# Patient Record
Sex: Male | Born: 1940 | Race: White | Hispanic: No | Smoking: Former smoker
Health system: Southern US, Community
[De-identification: ages and names within clinical notes are randomized; demographics above are authoritative.]

## PROBLEM LIST (undated history)

## (undated) DIAGNOSIS — F419 Anxiety disorder, unspecified: Secondary | ICD-10-CM

## (undated) DIAGNOSIS — T8859XA Other complications of anesthesia, initial encounter: Secondary | ICD-10-CM

## (undated) DIAGNOSIS — I480 Paroxysmal atrial fibrillation: Secondary | ICD-10-CM

## (undated) DIAGNOSIS — F209 Schizophrenia, unspecified: Secondary | ICD-10-CM

## (undated) DIAGNOSIS — I5032 Chronic diastolic (congestive) heart failure: Secondary | ICD-10-CM

## (undated) DIAGNOSIS — T4145XA Adverse effect of unspecified anesthetic, initial encounter: Secondary | ICD-10-CM

## (undated) DIAGNOSIS — M069 Rheumatoid arthritis, unspecified: Secondary | ICD-10-CM

## (undated) DIAGNOSIS — M545 Low back pain, unspecified: Secondary | ICD-10-CM

## (undated) DIAGNOSIS — R9389 Abnormal findings on diagnostic imaging of other specified body structures: Secondary | ICD-10-CM

## (undated) DIAGNOSIS — M48061 Spinal stenosis, lumbar region without neurogenic claudication: Secondary | ICD-10-CM

## (undated) DIAGNOSIS — E785 Hyperlipidemia, unspecified: Secondary | ICD-10-CM

## (undated) DIAGNOSIS — F32A Depression, unspecified: Secondary | ICD-10-CM

## (undated) DIAGNOSIS — Z9889 Other specified postprocedural states: Secondary | ICD-10-CM

## (undated) DIAGNOSIS — J302 Other seasonal allergic rhinitis: Secondary | ICD-10-CM

## (undated) DIAGNOSIS — F329 Major depressive disorder, single episode, unspecified: Secondary | ICD-10-CM

## (undated) DIAGNOSIS — H409 Unspecified glaucoma: Secondary | ICD-10-CM

## (undated) DIAGNOSIS — Z9289 Personal history of other medical treatment: Secondary | ICD-10-CM

## (undated) DIAGNOSIS — G8929 Other chronic pain: Secondary | ICD-10-CM

## (undated) DIAGNOSIS — Z973 Presence of spectacles and contact lenses: Secondary | ICD-10-CM

## (undated) DIAGNOSIS — Z8679 Personal history of other diseases of the circulatory system: Secondary | ICD-10-CM

## (undated) DIAGNOSIS — R413 Other amnesia: Secondary | ICD-10-CM

## (undated) DIAGNOSIS — Z8619 Personal history of other infectious and parasitic diseases: Secondary | ICD-10-CM

## (undated) DIAGNOSIS — I499 Cardiac arrhythmia, unspecified: Secondary | ICD-10-CM

## (undated) DIAGNOSIS — I2510 Atherosclerotic heart disease of native coronary artery without angina pectoris: Secondary | ICD-10-CM

## (undated) DIAGNOSIS — R011 Cardiac murmur, unspecified: Secondary | ICD-10-CM

## (undated) DIAGNOSIS — G629 Polyneuropathy, unspecified: Secondary | ICD-10-CM

## (undated) DIAGNOSIS — M546 Pain in thoracic spine: Secondary | ICD-10-CM

## (undated) DIAGNOSIS — M1990 Unspecified osteoarthritis, unspecified site: Secondary | ICD-10-CM

## (undated) DIAGNOSIS — I10 Essential (primary) hypertension: Secondary | ICD-10-CM

## (undated) DIAGNOSIS — I38 Endocarditis, valve unspecified: Secondary | ICD-10-CM

## (undated) DIAGNOSIS — K219 Gastro-esophageal reflux disease without esophagitis: Secondary | ICD-10-CM

## (undated) DIAGNOSIS — I509 Heart failure, unspecified: Secondary | ICD-10-CM

## (undated) DIAGNOSIS — R519 Headache, unspecified: Secondary | ICD-10-CM

## (undated) DIAGNOSIS — I4821 Permanent atrial fibrillation: Secondary | ICD-10-CM

## (undated) DIAGNOSIS — D649 Anemia, unspecified: Secondary | ICD-10-CM

## (undated) HISTORY — DX: Personal history of other infectious and parasitic diseases: Z86.19

## (undated) HISTORY — PX: CARDIAC VALVE REPLACEMENT: SHX585

## (undated) HISTORY — DX: Personal history of other medical treatment: Z92.89

## (undated) HISTORY — DX: Personal history of other diseases of the circulatory system: Z86.79

## (undated) HISTORY — DX: Endocarditis, valve unspecified: I38

## (undated) HISTORY — PX: BACK SURGERY: SHX140

## (undated) HISTORY — DX: Paroxysmal atrial fibrillation: I48.0

## (undated) HISTORY — DX: Major depressive disorder, single episode, unspecified: F32.9

## (undated) HISTORY — DX: Permanent atrial fibrillation: I48.21

## (undated) HISTORY — DX: Chronic diastolic (congestive) heart failure: I50.32

## (undated) HISTORY — DX: Heart failure, unspecified: I50.9

## (undated) HISTORY — DX: Hyperlipidemia, unspecified: E78.5

## (undated) HISTORY — DX: Cardiac arrhythmia, unspecified: I49.9

## (undated) HISTORY — DX: Atherosclerotic heart disease of native coronary artery without angina pectoris: I25.10

## (undated) HISTORY — DX: Depression, unspecified: F32.A

## (undated) HISTORY — DX: Essential (primary) hypertension: I10

## (undated) HISTORY — DX: Cardiac murmur, unspecified: R01.1

## (undated) HISTORY — PX: MULTIPLE TOOTH EXTRACTIONS: SHX2053

## (undated) HISTORY — DX: Personal history of other diseases of the circulatory system: Z98.890

## (undated) HISTORY — PX: JOINT REPLACEMENT: SHX530

## (undated) HISTORY — DX: Unspecified glaucoma: H40.9

---

## 2001-05-24 HISTORY — PX: REPLACEMENT TOTAL KNEE: SUR1224

## 2005-05-24 HISTORY — PX: AORTIC VALVE REPLACEMENT: SHX41

## 2008-05-24 HISTORY — PX: THORACIC AORTIC ANEURYSM REPAIR: SHX799

## 2008-05-24 HISTORY — PX: LAPAROSCOPIC CHOLECYSTECTOMY: SUR755

## 2008-05-24 HISTORY — PX: APPENDECTOMY: SHX54

## 2010-05-24 HISTORY — PX: CATARACT EXTRACTION W/ INTRAOCULAR LENS  IMPLANT, BILATERAL: SHX1307

## 2010-11-06 DIAGNOSIS — G451 Carotid artery syndrome (hemispheric): Secondary | ICD-10-CM | POA: Insufficient documentation

## 2011-06-10 DIAGNOSIS — I6529 Occlusion and stenosis of unspecified carotid artery: Secondary | ICD-10-CM | POA: Diagnosis not present

## 2011-06-21 DIAGNOSIS — I6529 Occlusion and stenosis of unspecified carotid artery: Secondary | ICD-10-CM | POA: Diagnosis not present

## 2011-06-24 DIAGNOSIS — H40019 Open angle with borderline findings, low risk, unspecified eye: Secondary | ICD-10-CM | POA: Diagnosis not present

## 2011-07-14 DIAGNOSIS — M25519 Pain in unspecified shoulder: Secondary | ICD-10-CM | POA: Diagnosis not present

## 2011-07-14 DIAGNOSIS — M810 Age-related osteoporosis without current pathological fracture: Secondary | ICD-10-CM | POA: Diagnosis not present

## 2011-07-14 DIAGNOSIS — M069 Rheumatoid arthritis, unspecified: Secondary | ICD-10-CM | POA: Diagnosis not present

## 2011-07-14 DIAGNOSIS — M159 Polyosteoarthritis, unspecified: Secondary | ICD-10-CM | POA: Diagnosis not present

## 2011-07-14 DIAGNOSIS — M949 Disorder of cartilage, unspecified: Secondary | ICD-10-CM | POA: Diagnosis not present

## 2011-07-14 DIAGNOSIS — G603 Idiopathic progressive neuropathy: Secondary | ICD-10-CM | POA: Diagnosis not present

## 2011-07-14 DIAGNOSIS — M899 Disorder of bone, unspecified: Secondary | ICD-10-CM | POA: Diagnosis not present

## 2011-07-14 DIAGNOSIS — Z79899 Other long term (current) drug therapy: Secondary | ICD-10-CM | POA: Diagnosis not present

## 2011-09-07 DIAGNOSIS — R7989 Other specified abnormal findings of blood chemistry: Secondary | ICD-10-CM | POA: Diagnosis not present

## 2011-09-07 DIAGNOSIS — R5381 Other malaise: Secondary | ICD-10-CM | POA: Diagnosis not present

## 2011-09-07 DIAGNOSIS — R5383 Other fatigue: Secondary | ICD-10-CM | POA: Diagnosis not present

## 2011-09-07 DIAGNOSIS — M2550 Pain in unspecified joint: Secondary | ICD-10-CM | POA: Diagnosis not present

## 2011-09-07 DIAGNOSIS — E291 Testicular hypofunction: Secondary | ICD-10-CM | POA: Diagnosis not present

## 2011-09-07 DIAGNOSIS — E785 Hyperlipidemia, unspecified: Secondary | ICD-10-CM | POA: Diagnosis not present

## 2011-09-07 DIAGNOSIS — D649 Anemia, unspecified: Secondary | ICD-10-CM | POA: Diagnosis not present

## 2011-09-07 DIAGNOSIS — R809 Proteinuria, unspecified: Secondary | ICD-10-CM | POA: Diagnosis not present

## 2011-09-07 DIAGNOSIS — I10 Essential (primary) hypertension: Secondary | ICD-10-CM | POA: Diagnosis not present

## 2011-09-07 DIAGNOSIS — T672XXA Heat cramp, initial encounter: Secondary | ICD-10-CM | POA: Diagnosis not present

## 2011-09-07 DIAGNOSIS — D518 Other vitamin B12 deficiency anemias: Secondary | ICD-10-CM | POA: Diagnosis not present

## 2011-09-07 DIAGNOSIS — E782 Mixed hyperlipidemia: Secondary | ICD-10-CM | POA: Diagnosis not present

## 2011-09-10 DIAGNOSIS — I2510 Atherosclerotic heart disease of native coronary artery without angina pectoris: Secondary | ICD-10-CM | POA: Diagnosis not present

## 2011-09-10 DIAGNOSIS — E785 Hyperlipidemia, unspecified: Secondary | ICD-10-CM | POA: Diagnosis not present

## 2011-09-10 DIAGNOSIS — I712 Thoracic aortic aneurysm, without rupture, unspecified: Secondary | ICD-10-CM | POA: Diagnosis not present

## 2011-09-10 DIAGNOSIS — G458 Other transient cerebral ischemic attacks and related syndromes: Secondary | ICD-10-CM | POA: Diagnosis not present

## 2011-09-13 DIAGNOSIS — R7989 Other specified abnormal findings of blood chemistry: Secondary | ICD-10-CM | POA: Diagnosis not present

## 2011-09-13 DIAGNOSIS — E782 Mixed hyperlipidemia: Secondary | ICD-10-CM | POA: Diagnosis not present

## 2011-09-13 DIAGNOSIS — I10 Essential (primary) hypertension: Secondary | ICD-10-CM | POA: Diagnosis not present

## 2011-09-13 DIAGNOSIS — D649 Anemia, unspecified: Secondary | ICD-10-CM | POA: Diagnosis not present

## 2011-09-14 DIAGNOSIS — I129 Hypertensive chronic kidney disease with stage 1 through stage 4 chronic kidney disease, or unspecified chronic kidney disease: Secondary | ICD-10-CM | POA: Diagnosis not present

## 2011-09-14 DIAGNOSIS — N183 Chronic kidney disease, stage 3 unspecified: Secondary | ICD-10-CM | POA: Diagnosis not present

## 2011-09-30 DIAGNOSIS — H40019 Open angle with borderline findings, low risk, unspecified eye: Secondary | ICD-10-CM | POA: Diagnosis not present

## 2011-10-11 DIAGNOSIS — M069 Rheumatoid arthritis, unspecified: Secondary | ICD-10-CM | POA: Diagnosis not present

## 2011-10-11 DIAGNOSIS — G608 Other hereditary and idiopathic neuropathies: Secondary | ICD-10-CM | POA: Diagnosis not present

## 2011-10-11 DIAGNOSIS — M159 Polyosteoarthritis, unspecified: Secondary | ICD-10-CM | POA: Diagnosis not present

## 2011-10-19 DIAGNOSIS — Z0000 Encounter for general adult medical examination without abnormal findings: Secondary | ICD-10-CM | POA: Diagnosis not present

## 2011-10-19 DIAGNOSIS — D649 Anemia, unspecified: Secondary | ICD-10-CM | POA: Diagnosis not present

## 2011-10-19 DIAGNOSIS — N400 Enlarged prostate without lower urinary tract symptoms: Secondary | ICD-10-CM | POA: Diagnosis not present

## 2011-10-19 DIAGNOSIS — I10 Essential (primary) hypertension: Secondary | ICD-10-CM | POA: Diagnosis not present

## 2011-10-19 DIAGNOSIS — Z1211 Encounter for screening for malignant neoplasm of colon: Secondary | ICD-10-CM | POA: Diagnosis not present

## 2011-11-05 DIAGNOSIS — M2040 Other hammer toe(s) (acquired), unspecified foot: Secondary | ICD-10-CM | POA: Diagnosis not present

## 2011-12-06 DIAGNOSIS — R5381 Other malaise: Secondary | ICD-10-CM | POA: Diagnosis not present

## 2011-12-06 DIAGNOSIS — R7989 Other specified abnormal findings of blood chemistry: Secondary | ICD-10-CM | POA: Diagnosis not present

## 2011-12-06 DIAGNOSIS — I10 Essential (primary) hypertension: Secondary | ICD-10-CM | POA: Diagnosis not present

## 2011-12-06 DIAGNOSIS — D518 Other vitamin B12 deficiency anemias: Secondary | ICD-10-CM | POA: Diagnosis not present

## 2011-12-06 DIAGNOSIS — R5383 Other fatigue: Secondary | ICD-10-CM | POA: Diagnosis not present

## 2011-12-06 DIAGNOSIS — D649 Anemia, unspecified: Secondary | ICD-10-CM | POA: Diagnosis not present

## 2011-12-06 DIAGNOSIS — M2550 Pain in unspecified joint: Secondary | ICD-10-CM | POA: Diagnosis not present

## 2011-12-06 DIAGNOSIS — R109 Unspecified abdominal pain: Secondary | ICD-10-CM | POA: Diagnosis not present

## 2011-12-06 DIAGNOSIS — T672XXA Heat cramp, initial encounter: Secondary | ICD-10-CM | POA: Diagnosis not present

## 2011-12-06 DIAGNOSIS — R300 Dysuria: Secondary | ICD-10-CM | POA: Diagnosis not present

## 2011-12-06 DIAGNOSIS — E785 Hyperlipidemia, unspecified: Secondary | ICD-10-CM | POA: Diagnosis not present

## 2011-12-06 DIAGNOSIS — E291 Testicular hypofunction: Secondary | ICD-10-CM | POA: Diagnosis not present

## 2011-12-06 DIAGNOSIS — E782 Mixed hyperlipidemia: Secondary | ICD-10-CM | POA: Diagnosis not present

## 2011-12-08 DIAGNOSIS — L570 Actinic keratosis: Secondary | ICD-10-CM | POA: Diagnosis not present

## 2011-12-08 DIAGNOSIS — C44519 Basal cell carcinoma of skin of other part of trunk: Secondary | ICD-10-CM | POA: Diagnosis not present

## 2011-12-08 DIAGNOSIS — L821 Other seborrheic keratosis: Secondary | ICD-10-CM | POA: Diagnosis not present

## 2011-12-08 DIAGNOSIS — L820 Inflamed seborrheic keratosis: Secondary | ICD-10-CM | POA: Diagnosis not present

## 2011-12-08 DIAGNOSIS — D492 Neoplasm of unspecified behavior of bone, soft tissue, and skin: Secondary | ICD-10-CM | POA: Diagnosis not present

## 2011-12-13 DIAGNOSIS — E782 Mixed hyperlipidemia: Secondary | ICD-10-CM | POA: Diagnosis not present

## 2011-12-13 DIAGNOSIS — I10 Essential (primary) hypertension: Secondary | ICD-10-CM | POA: Diagnosis not present

## 2011-12-13 DIAGNOSIS — E291 Testicular hypofunction: Secondary | ICD-10-CM | POA: Diagnosis not present

## 2011-12-13 DIAGNOSIS — R7989 Other specified abnormal findings of blood chemistry: Secondary | ICD-10-CM | POA: Diagnosis not present

## 2012-01-10 DIAGNOSIS — D045 Carcinoma in situ of skin of trunk: Secondary | ICD-10-CM | POA: Diagnosis not present

## 2012-01-14 DIAGNOSIS — M069 Rheumatoid arthritis, unspecified: Secondary | ICD-10-CM | POA: Diagnosis not present

## 2012-01-14 DIAGNOSIS — I10 Essential (primary) hypertension: Secondary | ICD-10-CM | POA: Diagnosis not present

## 2012-01-14 DIAGNOSIS — E291 Testicular hypofunction: Secondary | ICD-10-CM | POA: Diagnosis not present

## 2012-01-14 DIAGNOSIS — B029 Zoster without complications: Secondary | ICD-10-CM | POA: Diagnosis not present

## 2012-01-19 DIAGNOSIS — R21 Rash and other nonspecific skin eruption: Secondary | ICD-10-CM | POA: Diagnosis not present

## 2012-01-19 DIAGNOSIS — I10 Essential (primary) hypertension: Secondary | ICD-10-CM | POA: Diagnosis not present

## 2012-01-19 DIAGNOSIS — M79609 Pain in unspecified limb: Secondary | ICD-10-CM | POA: Diagnosis not present

## 2012-01-19 DIAGNOSIS — B029 Zoster without complications: Secondary | ICD-10-CM | POA: Diagnosis not present

## 2012-01-31 DIAGNOSIS — R930 Abnormal findings on diagnostic imaging of skull and head, not elsewhere classified: Secondary | ICD-10-CM | POA: Diagnosis not present

## 2012-01-31 DIAGNOSIS — E291 Testicular hypofunction: Secondary | ICD-10-CM | POA: Diagnosis not present

## 2012-01-31 DIAGNOSIS — I10 Essential (primary) hypertension: Secondary | ICD-10-CM | POA: Diagnosis not present

## 2012-01-31 DIAGNOSIS — I6789 Other cerebrovascular disease: Secondary | ICD-10-CM | POA: Diagnosis not present

## 2012-02-16 DIAGNOSIS — E291 Testicular hypofunction: Secondary | ICD-10-CM | POA: Diagnosis not present

## 2012-02-16 DIAGNOSIS — R5381 Other malaise: Secondary | ICD-10-CM | POA: Diagnosis not present

## 2012-02-16 DIAGNOSIS — R5383 Other fatigue: Secondary | ICD-10-CM | POA: Diagnosis not present

## 2012-02-16 DIAGNOSIS — I10 Essential (primary) hypertension: Secondary | ICD-10-CM | POA: Diagnosis not present

## 2012-02-16 DIAGNOSIS — M069 Rheumatoid arthritis, unspecified: Secondary | ICD-10-CM | POA: Diagnosis not present

## 2012-02-17 DIAGNOSIS — R5381 Other malaise: Secondary | ICD-10-CM | POA: Diagnosis not present

## 2012-02-17 DIAGNOSIS — E785 Hyperlipidemia, unspecified: Secondary | ICD-10-CM | POA: Diagnosis not present

## 2012-02-17 DIAGNOSIS — T672XXA Heat cramp, initial encounter: Secondary | ICD-10-CM | POA: Diagnosis not present

## 2012-02-17 DIAGNOSIS — R5383 Other fatigue: Secondary | ICD-10-CM | POA: Diagnosis not present

## 2012-02-17 DIAGNOSIS — E782 Mixed hyperlipidemia: Secondary | ICD-10-CM | POA: Diagnosis not present

## 2012-02-17 DIAGNOSIS — I10 Essential (primary) hypertension: Secondary | ICD-10-CM | POA: Diagnosis not present

## 2012-02-17 DIAGNOSIS — R7989 Other specified abnormal findings of blood chemistry: Secondary | ICD-10-CM | POA: Diagnosis not present

## 2012-02-17 DIAGNOSIS — R109 Unspecified abdominal pain: Secondary | ICD-10-CM | POA: Diagnosis not present

## 2012-02-17 DIAGNOSIS — D518 Other vitamin B12 deficiency anemias: Secondary | ICD-10-CM | POA: Diagnosis not present

## 2012-02-17 DIAGNOSIS — D649 Anemia, unspecified: Secondary | ICD-10-CM | POA: Diagnosis not present

## 2012-02-17 DIAGNOSIS — M2550 Pain in unspecified joint: Secondary | ICD-10-CM | POA: Diagnosis not present

## 2012-02-22 DIAGNOSIS — R5383 Other fatigue: Secondary | ICD-10-CM | POA: Diagnosis not present

## 2012-02-22 DIAGNOSIS — R5381 Other malaise: Secondary | ICD-10-CM | POA: Diagnosis not present

## 2012-02-22 DIAGNOSIS — I10 Essential (primary) hypertension: Secondary | ICD-10-CM | POA: Diagnosis not present

## 2012-02-22 DIAGNOSIS — E782 Mixed hyperlipidemia: Secondary | ICD-10-CM | POA: Diagnosis not present

## 2012-02-22 DIAGNOSIS — E291 Testicular hypofunction: Secondary | ICD-10-CM | POA: Diagnosis not present

## 2012-03-06 DIAGNOSIS — R5383 Other fatigue: Secondary | ICD-10-CM | POA: Diagnosis not present

## 2012-03-06 DIAGNOSIS — R5381 Other malaise: Secondary | ICD-10-CM | POA: Diagnosis not present

## 2012-03-06 DIAGNOSIS — E291 Testicular hypofunction: Secondary | ICD-10-CM | POA: Diagnosis not present

## 2012-03-06 DIAGNOSIS — I6789 Other cerebrovascular disease: Secondary | ICD-10-CM | POA: Diagnosis not present

## 2012-03-15 DIAGNOSIS — M069 Rheumatoid arthritis, unspecified: Secondary | ICD-10-CM | POA: Diagnosis not present

## 2012-03-15 DIAGNOSIS — G609 Hereditary and idiopathic neuropathy, unspecified: Secondary | ICD-10-CM | POA: Diagnosis not present

## 2012-03-15 DIAGNOSIS — D61818 Other pancytopenia: Secondary | ICD-10-CM | POA: Diagnosis not present

## 2012-03-21 DIAGNOSIS — D709 Neutropenia, unspecified: Secondary | ICD-10-CM | POA: Diagnosis not present

## 2012-03-21 DIAGNOSIS — E782 Mixed hyperlipidemia: Secondary | ICD-10-CM | POA: Diagnosis not present

## 2012-03-21 DIAGNOSIS — E291 Testicular hypofunction: Secondary | ICD-10-CM | POA: Diagnosis not present

## 2012-03-21 DIAGNOSIS — I10 Essential (primary) hypertension: Secondary | ICD-10-CM | POA: Diagnosis not present

## 2012-04-03 DIAGNOSIS — I10 Essential (primary) hypertension: Secondary | ICD-10-CM | POA: Diagnosis not present

## 2012-04-03 DIAGNOSIS — E291 Testicular hypofunction: Secondary | ICD-10-CM | POA: Diagnosis not present

## 2012-04-03 DIAGNOSIS — R5381 Other malaise: Secondary | ICD-10-CM | POA: Diagnosis not present

## 2012-04-03 DIAGNOSIS — R5383 Other fatigue: Secondary | ICD-10-CM | POA: Diagnosis not present

## 2012-04-07 DIAGNOSIS — H40019 Open angle with borderline findings, low risk, unspecified eye: Secondary | ICD-10-CM | POA: Diagnosis not present

## 2012-04-10 DIAGNOSIS — M069 Rheumatoid arthritis, unspecified: Secondary | ICD-10-CM | POA: Diagnosis not present

## 2012-04-10 DIAGNOSIS — M25519 Pain in unspecified shoulder: Secondary | ICD-10-CM | POA: Diagnosis not present

## 2012-04-10 DIAGNOSIS — M810 Age-related osteoporosis without current pathological fracture: Secondary | ICD-10-CM | POA: Diagnosis not present

## 2012-04-10 DIAGNOSIS — Z79899 Other long term (current) drug therapy: Secondary | ICD-10-CM | POA: Diagnosis not present

## 2012-04-10 DIAGNOSIS — G603 Idiopathic progressive neuropathy: Secondary | ICD-10-CM | POA: Diagnosis not present

## 2012-04-17 DIAGNOSIS — I10 Essential (primary) hypertension: Secondary | ICD-10-CM | POA: Diagnosis not present

## 2012-04-17 DIAGNOSIS — M069 Rheumatoid arthritis, unspecified: Secondary | ICD-10-CM | POA: Diagnosis not present

## 2012-04-17 DIAGNOSIS — E291 Testicular hypofunction: Secondary | ICD-10-CM | POA: Diagnosis not present

## 2012-04-17 DIAGNOSIS — N289 Disorder of kidney and ureter, unspecified: Secondary | ICD-10-CM | POA: Diagnosis not present

## 2012-04-26 DIAGNOSIS — D61818 Other pancytopenia: Secondary | ICD-10-CM | POA: Diagnosis not present

## 2012-04-26 DIAGNOSIS — D709 Neutropenia, unspecified: Secondary | ICD-10-CM | POA: Diagnosis not present

## 2012-04-26 DIAGNOSIS — M069 Rheumatoid arthritis, unspecified: Secondary | ICD-10-CM | POA: Diagnosis not present

## 2012-04-26 DIAGNOSIS — G609 Hereditary and idiopathic neuropathy, unspecified: Secondary | ICD-10-CM | POA: Diagnosis not present

## 2012-04-26 DIAGNOSIS — D649 Anemia, unspecified: Secondary | ICD-10-CM | POA: Diagnosis not present

## 2012-05-01 DIAGNOSIS — N400 Enlarged prostate without lower urinary tract symptoms: Secondary | ICD-10-CM | POA: Diagnosis not present

## 2012-05-01 DIAGNOSIS — M2550 Pain in unspecified joint: Secondary | ICD-10-CM | POA: Diagnosis not present

## 2012-05-01 DIAGNOSIS — E291 Testicular hypofunction: Secondary | ICD-10-CM | POA: Diagnosis not present

## 2012-05-01 DIAGNOSIS — I10 Essential (primary) hypertension: Secondary | ICD-10-CM | POA: Diagnosis not present

## 2012-05-01 DIAGNOSIS — Z79899 Other long term (current) drug therapy: Secondary | ICD-10-CM | POA: Diagnosis not present

## 2012-05-01 DIAGNOSIS — D649 Anemia, unspecified: Secondary | ICD-10-CM | POA: Diagnosis not present

## 2012-05-15 DIAGNOSIS — D696 Thrombocytopenia, unspecified: Secondary | ICD-10-CM | POA: Diagnosis not present

## 2012-05-15 DIAGNOSIS — D649 Anemia, unspecified: Secondary | ICD-10-CM | POA: Diagnosis not present

## 2012-05-15 DIAGNOSIS — I10 Essential (primary) hypertension: Secondary | ICD-10-CM | POA: Diagnosis not present

## 2012-05-15 DIAGNOSIS — E291 Testicular hypofunction: Secondary | ICD-10-CM | POA: Diagnosis not present

## 2012-05-30 DIAGNOSIS — E291 Testicular hypofunction: Secondary | ICD-10-CM | POA: Diagnosis not present

## 2012-05-30 DIAGNOSIS — M069 Rheumatoid arthritis, unspecified: Secondary | ICD-10-CM | POA: Diagnosis not present

## 2012-05-30 DIAGNOSIS — I10 Essential (primary) hypertension: Secondary | ICD-10-CM | POA: Diagnosis not present

## 2012-06-13 DIAGNOSIS — E785 Hyperlipidemia, unspecified: Secondary | ICD-10-CM | POA: Diagnosis not present

## 2012-06-13 DIAGNOSIS — M069 Rheumatoid arthritis, unspecified: Secondary | ICD-10-CM | POA: Diagnosis not present

## 2012-06-13 DIAGNOSIS — D649 Anemia, unspecified: Secondary | ICD-10-CM | POA: Diagnosis not present

## 2012-06-13 DIAGNOSIS — M2550 Pain in unspecified joint: Secondary | ICD-10-CM | POA: Diagnosis not present

## 2012-06-13 DIAGNOSIS — R809 Proteinuria, unspecified: Secondary | ICD-10-CM | POA: Diagnosis not present

## 2012-06-13 DIAGNOSIS — Z79899 Other long term (current) drug therapy: Secondary | ICD-10-CM | POA: Diagnosis not present

## 2012-06-13 DIAGNOSIS — E291 Testicular hypofunction: Secondary | ICD-10-CM | POA: Diagnosis not present

## 2012-06-13 DIAGNOSIS — I10 Essential (primary) hypertension: Secondary | ICD-10-CM | POA: Diagnosis not present

## 2012-06-13 DIAGNOSIS — R7989 Other specified abnormal findings of blood chemistry: Secondary | ICD-10-CM | POA: Diagnosis not present

## 2012-06-13 DIAGNOSIS — E782 Mixed hyperlipidemia: Secondary | ICD-10-CM | POA: Diagnosis not present

## 2012-06-26 DIAGNOSIS — I10 Essential (primary) hypertension: Secondary | ICD-10-CM | POA: Diagnosis not present

## 2012-06-26 DIAGNOSIS — M069 Rheumatoid arthritis, unspecified: Secondary | ICD-10-CM | POA: Diagnosis not present

## 2012-06-26 DIAGNOSIS — E291 Testicular hypofunction: Secondary | ICD-10-CM | POA: Diagnosis not present

## 2012-06-26 DIAGNOSIS — E782 Mixed hyperlipidemia: Secondary | ICD-10-CM | POA: Diagnosis not present

## 2012-07-13 DIAGNOSIS — M1990 Unspecified osteoarthritis, unspecified site: Secondary | ICD-10-CM | POA: Diagnosis not present

## 2012-07-13 DIAGNOSIS — M069 Rheumatoid arthritis, unspecified: Secondary | ICD-10-CM | POA: Diagnosis not present

## 2012-07-13 DIAGNOSIS — M159 Polyosteoarthritis, unspecified: Secondary | ICD-10-CM | POA: Diagnosis not present

## 2012-07-13 DIAGNOSIS — Z79899 Other long term (current) drug therapy: Secondary | ICD-10-CM | POA: Diagnosis not present

## 2012-07-13 DIAGNOSIS — M25519 Pain in unspecified shoulder: Secondary | ICD-10-CM | POA: Diagnosis not present

## 2012-07-18 DIAGNOSIS — M069 Rheumatoid arthritis, unspecified: Secondary | ICD-10-CM | POA: Diagnosis not present

## 2012-07-18 DIAGNOSIS — E291 Testicular hypofunction: Secondary | ICD-10-CM | POA: Diagnosis not present

## 2012-07-18 DIAGNOSIS — I10 Essential (primary) hypertension: Secondary | ICD-10-CM | POA: Diagnosis not present

## 2012-07-20 DIAGNOSIS — I6529 Occlusion and stenosis of unspecified carotid artery: Secondary | ICD-10-CM | POA: Diagnosis not present

## 2012-07-31 DIAGNOSIS — I6529 Occlusion and stenosis of unspecified carotid artery: Secondary | ICD-10-CM | POA: Diagnosis not present

## 2012-08-01 DIAGNOSIS — M069 Rheumatoid arthritis, unspecified: Secondary | ICD-10-CM | POA: Diagnosis not present

## 2012-08-01 DIAGNOSIS — E291 Testicular hypofunction: Secondary | ICD-10-CM | POA: Diagnosis not present

## 2012-08-01 DIAGNOSIS — I10 Essential (primary) hypertension: Secondary | ICD-10-CM | POA: Diagnosis not present

## 2012-08-01 DIAGNOSIS — N289 Disorder of kidney and ureter, unspecified: Secondary | ICD-10-CM | POA: Diagnosis not present

## 2012-08-08 DIAGNOSIS — I2510 Atherosclerotic heart disease of native coronary artery without angina pectoris: Secondary | ICD-10-CM | POA: Diagnosis not present

## 2012-08-08 DIAGNOSIS — I4891 Unspecified atrial fibrillation: Secondary | ICD-10-CM | POA: Diagnosis not present

## 2012-08-08 DIAGNOSIS — I712 Thoracic aortic aneurysm, without rupture, unspecified: Secondary | ICD-10-CM | POA: Diagnosis not present

## 2012-08-08 DIAGNOSIS — Z954 Presence of other heart-valve replacement: Secondary | ICD-10-CM | POA: Diagnosis not present

## 2012-08-16 DIAGNOSIS — R7989 Other specified abnormal findings of blood chemistry: Secondary | ICD-10-CM | POA: Diagnosis not present

## 2012-08-16 DIAGNOSIS — D518 Other vitamin B12 deficiency anemias: Secondary | ICD-10-CM | POA: Diagnosis not present

## 2012-08-16 DIAGNOSIS — N400 Enlarged prostate without lower urinary tract symptoms: Secondary | ICD-10-CM | POA: Diagnosis not present

## 2012-08-16 DIAGNOSIS — M2550 Pain in unspecified joint: Secondary | ICD-10-CM | POA: Diagnosis not present

## 2012-08-16 DIAGNOSIS — R5383 Other fatigue: Secondary | ICD-10-CM | POA: Diagnosis not present

## 2012-08-16 DIAGNOSIS — D649 Anemia, unspecified: Secondary | ICD-10-CM | POA: Diagnosis not present

## 2012-08-16 DIAGNOSIS — E559 Vitamin D deficiency, unspecified: Secondary | ICD-10-CM | POA: Diagnosis not present

## 2012-08-16 DIAGNOSIS — R5381 Other malaise: Secondary | ICD-10-CM | POA: Diagnosis not present

## 2012-08-16 DIAGNOSIS — E291 Testicular hypofunction: Secondary | ICD-10-CM | POA: Diagnosis not present

## 2012-08-16 DIAGNOSIS — T672XXA Heat cramp, initial encounter: Secondary | ICD-10-CM | POA: Diagnosis not present

## 2012-09-04 DIAGNOSIS — E782 Mixed hyperlipidemia: Secondary | ICD-10-CM | POA: Diagnosis not present

## 2012-09-04 DIAGNOSIS — R6889 Other general symptoms and signs: Secondary | ICD-10-CM | POA: Diagnosis not present

## 2012-09-04 DIAGNOSIS — I10 Essential (primary) hypertension: Secondary | ICD-10-CM | POA: Diagnosis not present

## 2012-09-04 DIAGNOSIS — E291 Testicular hypofunction: Secondary | ICD-10-CM | POA: Diagnosis not present

## 2012-09-12 DIAGNOSIS — Z954 Presence of other heart-valve replacement: Secondary | ICD-10-CM | POA: Diagnosis not present

## 2012-09-18 DIAGNOSIS — E291 Testicular hypofunction: Secondary | ICD-10-CM | POA: Diagnosis not present

## 2012-09-18 DIAGNOSIS — I10 Essential (primary) hypertension: Secondary | ICD-10-CM | POA: Diagnosis not present

## 2012-09-18 DIAGNOSIS — N289 Disorder of kidney and ureter, unspecified: Secondary | ICD-10-CM | POA: Diagnosis not present

## 2012-10-02 DIAGNOSIS — Z79899 Other long term (current) drug therapy: Secondary | ICD-10-CM | POA: Diagnosis not present

## 2012-10-02 DIAGNOSIS — G609 Hereditary and idiopathic neuropathy, unspecified: Secondary | ICD-10-CM | POA: Diagnosis not present

## 2012-10-02 DIAGNOSIS — M069 Rheumatoid arthritis, unspecified: Secondary | ICD-10-CM | POA: Diagnosis not present

## 2012-10-02 DIAGNOSIS — M1990 Unspecified osteoarthritis, unspecified site: Secondary | ICD-10-CM | POA: Diagnosis not present

## 2012-10-03 DIAGNOSIS — E291 Testicular hypofunction: Secondary | ICD-10-CM | POA: Diagnosis not present

## 2012-10-03 DIAGNOSIS — E782 Mixed hyperlipidemia: Secondary | ICD-10-CM | POA: Diagnosis not present

## 2012-10-03 DIAGNOSIS — I10 Essential (primary) hypertension: Secondary | ICD-10-CM | POA: Diagnosis not present

## 2012-10-10 DIAGNOSIS — Z79899 Other long term (current) drug therapy: Secondary | ICD-10-CM | POA: Diagnosis not present

## 2012-10-10 DIAGNOSIS — M069 Rheumatoid arthritis, unspecified: Secondary | ICD-10-CM | POA: Diagnosis not present

## 2012-10-17 DIAGNOSIS — E291 Testicular hypofunction: Secondary | ICD-10-CM | POA: Diagnosis not present

## 2012-10-17 DIAGNOSIS — R5381 Other malaise: Secondary | ICD-10-CM | POA: Diagnosis not present

## 2012-10-17 DIAGNOSIS — R5383 Other fatigue: Secondary | ICD-10-CM | POA: Diagnosis not present

## 2012-10-17 DIAGNOSIS — I10 Essential (primary) hypertension: Secondary | ICD-10-CM | POA: Diagnosis not present

## 2012-10-30 DIAGNOSIS — R7989 Other specified abnormal findings of blood chemistry: Secondary | ICD-10-CM | POA: Diagnosis not present

## 2012-10-30 DIAGNOSIS — D518 Other vitamin B12 deficiency anemias: Secondary | ICD-10-CM | POA: Diagnosis not present

## 2012-10-30 DIAGNOSIS — E291 Testicular hypofunction: Secondary | ICD-10-CM | POA: Diagnosis not present

## 2012-10-30 DIAGNOSIS — Z79899 Other long term (current) drug therapy: Secondary | ICD-10-CM | POA: Diagnosis not present

## 2012-10-30 DIAGNOSIS — M2550 Pain in unspecified joint: Secondary | ICD-10-CM | POA: Diagnosis not present

## 2012-10-30 DIAGNOSIS — E782 Mixed hyperlipidemia: Secondary | ICD-10-CM | POA: Diagnosis not present

## 2012-10-30 DIAGNOSIS — E559 Vitamin D deficiency, unspecified: Secondary | ICD-10-CM | POA: Diagnosis not present

## 2012-10-30 DIAGNOSIS — F329 Major depressive disorder, single episode, unspecified: Secondary | ICD-10-CM | POA: Diagnosis not present

## 2012-10-30 DIAGNOSIS — E785 Hyperlipidemia, unspecified: Secondary | ICD-10-CM | POA: Diagnosis not present

## 2012-10-30 DIAGNOSIS — I10 Essential (primary) hypertension: Secondary | ICD-10-CM | POA: Diagnosis not present

## 2012-10-30 DIAGNOSIS — R5381 Other malaise: Secondary | ICD-10-CM | POA: Diagnosis not present

## 2012-10-30 DIAGNOSIS — R5383 Other fatigue: Secondary | ICD-10-CM | POA: Diagnosis not present

## 2012-10-30 DIAGNOSIS — F3289 Other specified depressive episodes: Secondary | ICD-10-CM | POA: Diagnosis not present

## 2012-10-30 DIAGNOSIS — D696 Thrombocytopenia, unspecified: Secondary | ICD-10-CM | POA: Diagnosis not present

## 2012-11-20 DIAGNOSIS — L820 Inflamed seborrheic keratosis: Secondary | ICD-10-CM | POA: Diagnosis not present

## 2012-11-20 DIAGNOSIS — L821 Other seborrheic keratosis: Secondary | ICD-10-CM | POA: Diagnosis not present

## 2012-11-20 DIAGNOSIS — Z85828 Personal history of other malignant neoplasm of skin: Secondary | ICD-10-CM | POA: Diagnosis not present

## 2012-11-20 DIAGNOSIS — L570 Actinic keratosis: Secondary | ICD-10-CM | POA: Diagnosis not present

## 2012-11-20 DIAGNOSIS — Z719 Counseling, unspecified: Secondary | ICD-10-CM | POA: Diagnosis not present

## 2012-11-20 DIAGNOSIS — L819 Disorder of pigmentation, unspecified: Secondary | ICD-10-CM | POA: Diagnosis not present

## 2012-11-20 DIAGNOSIS — D235 Other benign neoplasm of skin of trunk: Secondary | ICD-10-CM | POA: Diagnosis not present

## 2012-11-21 DIAGNOSIS — I10 Essential (primary) hypertension: Secondary | ICD-10-CM | POA: Diagnosis not present

## 2012-11-21 DIAGNOSIS — N289 Disorder of kidney and ureter, unspecified: Secondary | ICD-10-CM | POA: Diagnosis not present

## 2012-11-21 DIAGNOSIS — E782 Mixed hyperlipidemia: Secondary | ICD-10-CM | POA: Diagnosis not present

## 2012-11-21 DIAGNOSIS — R5381 Other malaise: Secondary | ICD-10-CM | POA: Diagnosis not present

## 2012-11-21 DIAGNOSIS — D649 Anemia, unspecified: Secondary | ICD-10-CM | POA: Diagnosis not present

## 2012-11-21 DIAGNOSIS — R5383 Other fatigue: Secondary | ICD-10-CM | POA: Diagnosis not present

## 2012-11-21 DIAGNOSIS — E291 Testicular hypofunction: Secondary | ICD-10-CM | POA: Diagnosis not present

## 2012-12-04 DIAGNOSIS — G609 Hereditary and idiopathic neuropathy, unspecified: Secondary | ICD-10-CM | POA: Diagnosis not present

## 2012-12-04 DIAGNOSIS — IMO0002 Reserved for concepts with insufficient information to code with codable children: Secondary | ICD-10-CM | POA: Diagnosis not present

## 2012-12-04 DIAGNOSIS — Z79899 Other long term (current) drug therapy: Secondary | ICD-10-CM | POA: Diagnosis not present

## 2012-12-04 DIAGNOSIS — M069 Rheumatoid arthritis, unspecified: Secondary | ICD-10-CM | POA: Diagnosis not present

## 2012-12-04 DIAGNOSIS — M75100 Unspecified rotator cuff tear or rupture of unspecified shoulder, not specified as traumatic: Secondary | ICD-10-CM | POA: Diagnosis not present

## 2012-12-05 DIAGNOSIS — N289 Disorder of kidney and ureter, unspecified: Secondary | ICD-10-CM | POA: Diagnosis not present

## 2012-12-05 DIAGNOSIS — F3289 Other specified depressive episodes: Secondary | ICD-10-CM | POA: Diagnosis not present

## 2012-12-05 DIAGNOSIS — F329 Major depressive disorder, single episode, unspecified: Secondary | ICD-10-CM | POA: Diagnosis not present

## 2012-12-05 DIAGNOSIS — I10 Essential (primary) hypertension: Secondary | ICD-10-CM | POA: Diagnosis not present

## 2012-12-05 DIAGNOSIS — E291 Testicular hypofunction: Secondary | ICD-10-CM | POA: Diagnosis not present

## 2012-12-19 DIAGNOSIS — E291 Testicular hypofunction: Secondary | ICD-10-CM | POA: Diagnosis not present

## 2012-12-19 DIAGNOSIS — I2510 Atherosclerotic heart disease of native coronary artery without angina pectoris: Secondary | ICD-10-CM | POA: Diagnosis not present

## 2012-12-19 DIAGNOSIS — M069 Rheumatoid arthritis, unspecified: Secondary | ICD-10-CM | POA: Diagnosis not present

## 2012-12-19 DIAGNOSIS — I10 Essential (primary) hypertension: Secondary | ICD-10-CM | POA: Diagnosis not present

## 2013-01-03 DIAGNOSIS — E782 Mixed hyperlipidemia: Secondary | ICD-10-CM | POA: Diagnosis not present

## 2013-01-03 DIAGNOSIS — I10 Essential (primary) hypertension: Secondary | ICD-10-CM | POA: Diagnosis not present

## 2013-01-03 DIAGNOSIS — E291 Testicular hypofunction: Secondary | ICD-10-CM | POA: Diagnosis not present

## 2013-01-03 DIAGNOSIS — M2550 Pain in unspecified joint: Secondary | ICD-10-CM | POA: Diagnosis not present

## 2013-01-17 DIAGNOSIS — I10 Essential (primary) hypertension: Secondary | ICD-10-CM | POA: Diagnosis not present

## 2013-01-17 DIAGNOSIS — G4700 Insomnia, unspecified: Secondary | ICD-10-CM | POA: Diagnosis not present

## 2013-01-17 DIAGNOSIS — E291 Testicular hypofunction: Secondary | ICD-10-CM | POA: Diagnosis not present

## 2013-01-17 DIAGNOSIS — N289 Disorder of kidney and ureter, unspecified: Secondary | ICD-10-CM | POA: Diagnosis not present

## 2013-01-31 DIAGNOSIS — I10 Essential (primary) hypertension: Secondary | ICD-10-CM | POA: Diagnosis not present

## 2013-01-31 DIAGNOSIS — E559 Vitamin D deficiency, unspecified: Secondary | ICD-10-CM | POA: Diagnosis not present

## 2013-01-31 DIAGNOSIS — D518 Other vitamin B12 deficiency anemias: Secondary | ICD-10-CM | POA: Diagnosis not present

## 2013-01-31 DIAGNOSIS — D649 Anemia, unspecified: Secondary | ICD-10-CM | POA: Diagnosis not present

## 2013-01-31 DIAGNOSIS — R7989 Other specified abnormal findings of blood chemistry: Secondary | ICD-10-CM | POA: Diagnosis not present

## 2013-01-31 DIAGNOSIS — E291 Testicular hypofunction: Secondary | ICD-10-CM | POA: Diagnosis not present

## 2013-01-31 DIAGNOSIS — R5383 Other fatigue: Secondary | ICD-10-CM | POA: Diagnosis not present

## 2013-01-31 DIAGNOSIS — E785 Hyperlipidemia, unspecified: Secondary | ICD-10-CM | POA: Diagnosis not present

## 2013-01-31 DIAGNOSIS — D709 Neutropenia, unspecified: Secondary | ICD-10-CM | POA: Diagnosis not present

## 2013-01-31 DIAGNOSIS — E782 Mixed hyperlipidemia: Secondary | ICD-10-CM | POA: Diagnosis not present

## 2013-01-31 DIAGNOSIS — R5381 Other malaise: Secondary | ICD-10-CM | POA: Diagnosis not present

## 2013-01-31 DIAGNOSIS — R319 Hematuria, unspecified: Secondary | ICD-10-CM | POA: Diagnosis not present

## 2013-02-15 DIAGNOSIS — M069 Rheumatoid arthritis, unspecified: Secondary | ICD-10-CM | POA: Diagnosis not present

## 2013-02-15 DIAGNOSIS — I10 Essential (primary) hypertension: Secondary | ICD-10-CM | POA: Diagnosis not present

## 2013-02-15 DIAGNOSIS — N289 Disorder of kidney and ureter, unspecified: Secondary | ICD-10-CM | POA: Diagnosis not present

## 2013-02-15 DIAGNOSIS — E291 Testicular hypofunction: Secondary | ICD-10-CM | POA: Diagnosis not present

## 2013-03-01 DIAGNOSIS — I10 Essential (primary) hypertension: Secondary | ICD-10-CM | POA: Diagnosis not present

## 2013-03-01 DIAGNOSIS — Z79899 Other long term (current) drug therapy: Secondary | ICD-10-CM | POA: Diagnosis not present

## 2013-03-01 DIAGNOSIS — D649 Anemia, unspecified: Secondary | ICD-10-CM | POA: Diagnosis not present

## 2013-03-01 DIAGNOSIS — R6889 Other general symptoms and signs: Secondary | ICD-10-CM | POA: Diagnosis not present

## 2013-03-01 DIAGNOSIS — D709 Neutropenia, unspecified: Secondary | ICD-10-CM | POA: Diagnosis not present

## 2013-03-01 DIAGNOSIS — R7989 Other specified abnormal findings of blood chemistry: Secondary | ICD-10-CM | POA: Diagnosis not present

## 2013-03-01 DIAGNOSIS — E291 Testicular hypofunction: Secondary | ICD-10-CM | POA: Diagnosis not present

## 2013-03-09 DIAGNOSIS — Z23 Encounter for immunization: Secondary | ICD-10-CM | POA: Diagnosis not present

## 2013-03-19 DIAGNOSIS — G609 Hereditary and idiopathic neuropathy, unspecified: Secondary | ICD-10-CM | POA: Diagnosis not present

## 2013-03-19 DIAGNOSIS — R809 Proteinuria, unspecified: Secondary | ICD-10-CM | POA: Diagnosis not present

## 2013-03-19 DIAGNOSIS — M069 Rheumatoid arthritis, unspecified: Secondary | ICD-10-CM | POA: Diagnosis not present

## 2013-03-19 DIAGNOSIS — E291 Testicular hypofunction: Secondary | ICD-10-CM | POA: Diagnosis not present

## 2013-03-19 DIAGNOSIS — Z79899 Other long term (current) drug therapy: Secondary | ICD-10-CM | POA: Diagnosis not present

## 2013-03-19 DIAGNOSIS — N289 Disorder of kidney and ureter, unspecified: Secondary | ICD-10-CM | POA: Diagnosis not present

## 2013-03-19 DIAGNOSIS — N19 Unspecified kidney failure: Secondary | ICD-10-CM | POA: Diagnosis not present

## 2013-03-19 DIAGNOSIS — I10 Essential (primary) hypertension: Secondary | ICD-10-CM | POA: Diagnosis not present

## 2013-03-19 DIAGNOSIS — M1990 Unspecified osteoarthritis, unspecified site: Secondary | ICD-10-CM | POA: Diagnosis not present

## 2013-04-03 DIAGNOSIS — N289 Disorder of kidney and ureter, unspecified: Secondary | ICD-10-CM | POA: Diagnosis not present

## 2013-04-03 DIAGNOSIS — M069 Rheumatoid arthritis, unspecified: Secondary | ICD-10-CM | POA: Diagnosis not present

## 2013-04-03 DIAGNOSIS — E291 Testicular hypofunction: Secondary | ICD-10-CM | POA: Diagnosis not present

## 2013-04-03 DIAGNOSIS — I10 Essential (primary) hypertension: Secondary | ICD-10-CM | POA: Diagnosis not present

## 2013-04-11 DIAGNOSIS — N139 Obstructive and reflux uropathy, unspecified: Secondary | ICD-10-CM | POA: Diagnosis not present

## 2013-04-11 DIAGNOSIS — N401 Enlarged prostate with lower urinary tract symptoms: Secondary | ICD-10-CM | POA: Diagnosis not present

## 2013-04-11 DIAGNOSIS — N138 Other obstructive and reflux uropathy: Secondary | ICD-10-CM | POA: Diagnosis not present

## 2013-04-25 DIAGNOSIS — I712 Thoracic aortic aneurysm, without rupture, unspecified: Secondary | ICD-10-CM | POA: Diagnosis not present

## 2013-04-25 DIAGNOSIS — E785 Hyperlipidemia, unspecified: Secondary | ICD-10-CM | POA: Diagnosis not present

## 2013-04-25 DIAGNOSIS — I2510 Atherosclerotic heart disease of native coronary artery without angina pectoris: Secondary | ICD-10-CM | POA: Diagnosis not present

## 2013-04-25 DIAGNOSIS — Z954 Presence of other heart-valve replacement: Secondary | ICD-10-CM | POA: Diagnosis not present

## 2013-04-30 DIAGNOSIS — Z79899 Other long term (current) drug therapy: Secondary | ICD-10-CM | POA: Diagnosis not present

## 2013-04-30 DIAGNOSIS — Z0183 Encounter for blood typing: Secondary | ICD-10-CM | POA: Diagnosis not present

## 2013-04-30 DIAGNOSIS — Z01818 Encounter for other preprocedural examination: Secondary | ICD-10-CM | POA: Diagnosis not present

## 2013-05-05 DIAGNOSIS — Z7982 Long term (current) use of aspirin: Secondary | ICD-10-CM | POA: Diagnosis not present

## 2013-05-05 DIAGNOSIS — E7800 Pure hypercholesterolemia, unspecified: Secondary | ICD-10-CM | POA: Diagnosis not present

## 2013-05-05 DIAGNOSIS — L03039 Cellulitis of unspecified toe: Secondary | ICD-10-CM | POA: Diagnosis not present

## 2013-05-05 DIAGNOSIS — I10 Essential (primary) hypertension: Secondary | ICD-10-CM | POA: Diagnosis not present

## 2013-05-05 DIAGNOSIS — Z880 Allergy status to penicillin: Secondary | ICD-10-CM | POA: Diagnosis not present

## 2013-05-05 DIAGNOSIS — Z885 Allergy status to narcotic agent status: Secondary | ICD-10-CM | POA: Diagnosis not present

## 2013-05-05 DIAGNOSIS — L02619 Cutaneous abscess of unspecified foot: Secondary | ICD-10-CM | POA: Diagnosis not present

## 2013-05-05 DIAGNOSIS — M79609 Pain in unspecified limb: Secondary | ICD-10-CM | POA: Diagnosis not present

## 2013-05-05 DIAGNOSIS — M129 Arthropathy, unspecified: Secondary | ICD-10-CM | POA: Diagnosis not present

## 2013-05-05 DIAGNOSIS — M069 Rheumatoid arthritis, unspecified: Secondary | ICD-10-CM | POA: Diagnosis not present

## 2013-05-05 DIAGNOSIS — Z79899 Other long term (current) drug therapy: Secondary | ICD-10-CM | POA: Diagnosis not present

## 2013-05-08 DIAGNOSIS — N139 Obstructive and reflux uropathy, unspecified: Secondary | ICD-10-CM | POA: Diagnosis not present

## 2013-05-08 DIAGNOSIS — I4891 Unspecified atrial fibrillation: Secondary | ICD-10-CM | POA: Diagnosis not present

## 2013-05-08 DIAGNOSIS — I129 Hypertensive chronic kidney disease with stage 1 through stage 4 chronic kidney disease, or unspecified chronic kidney disease: Secondary | ICD-10-CM | POA: Diagnosis not present

## 2013-05-08 DIAGNOSIS — Z7982 Long term (current) use of aspirin: Secondary | ICD-10-CM | POA: Diagnosis not present

## 2013-05-08 DIAGNOSIS — N401 Enlarged prostate with lower urinary tract symptoms: Secondary | ICD-10-CM | POA: Diagnosis not present

## 2013-05-08 DIAGNOSIS — Z87891 Personal history of nicotine dependence: Secondary | ICD-10-CM | POA: Diagnosis not present

## 2013-05-08 DIAGNOSIS — E785 Hyperlipidemia, unspecified: Secondary | ICD-10-CM | POA: Diagnosis not present

## 2013-05-08 DIAGNOSIS — I2510 Atherosclerotic heart disease of native coronary artery without angina pectoris: Secondary | ICD-10-CM | POA: Diagnosis not present

## 2013-05-08 DIAGNOSIS — N138 Other obstructive and reflux uropathy: Secondary | ICD-10-CM | POA: Diagnosis not present

## 2013-05-08 DIAGNOSIS — N189 Chronic kidney disease, unspecified: Secondary | ICD-10-CM | POA: Diagnosis not present

## 2013-05-08 DIAGNOSIS — N400 Enlarged prostate without lower urinary tract symptoms: Secondary | ICD-10-CM | POA: Diagnosis not present

## 2013-05-23 DIAGNOSIS — L97509 Non-pressure chronic ulcer of other part of unspecified foot with unspecified severity: Secondary | ICD-10-CM | POA: Diagnosis not present

## 2013-06-21 DIAGNOSIS — M1990 Unspecified osteoarthritis, unspecified site: Secondary | ICD-10-CM | POA: Diagnosis not present

## 2013-06-21 DIAGNOSIS — IMO0002 Reserved for concepts with insufficient information to code with codable children: Secondary | ICD-10-CM | POA: Diagnosis not present

## 2013-06-21 DIAGNOSIS — M75100 Unspecified rotator cuff tear or rupture of unspecified shoulder, not specified as traumatic: Secondary | ICD-10-CM | POA: Diagnosis not present

## 2013-06-21 DIAGNOSIS — M069 Rheumatoid arthritis, unspecified: Secondary | ICD-10-CM | POA: Diagnosis not present

## 2013-06-21 DIAGNOSIS — Z79899 Other long term (current) drug therapy: Secondary | ICD-10-CM | POA: Diagnosis not present

## 2013-07-02 DIAGNOSIS — G459 Transient cerebral ischemic attack, unspecified: Secondary | ICD-10-CM | POA: Diagnosis not present

## 2013-07-02 DIAGNOSIS — N183 Chronic kidney disease, stage 3 unspecified: Secondary | ICD-10-CM | POA: Diagnosis not present

## 2013-07-02 DIAGNOSIS — I6529 Occlusion and stenosis of unspecified carotid artery: Secondary | ICD-10-CM | POA: Diagnosis not present

## 2013-07-02 DIAGNOSIS — R4182 Altered mental status, unspecified: Secondary | ICD-10-CM | POA: Diagnosis not present

## 2013-07-02 DIAGNOSIS — G589 Mononeuropathy, unspecified: Secondary | ICD-10-CM | POA: Diagnosis not present

## 2013-07-02 DIAGNOSIS — I498 Other specified cardiac arrhythmias: Secondary | ICD-10-CM | POA: Diagnosis not present

## 2013-07-02 DIAGNOSIS — I10 Essential (primary) hypertension: Secondary | ICD-10-CM | POA: Diagnosis not present

## 2013-07-02 DIAGNOSIS — G454 Transient global amnesia: Secondary | ICD-10-CM | POA: Diagnosis not present

## 2013-07-02 DIAGNOSIS — Z96659 Presence of unspecified artificial knee joint: Secondary | ICD-10-CM | POA: Diagnosis not present

## 2013-07-02 DIAGNOSIS — N289 Disorder of kidney and ureter, unspecified: Secondary | ICD-10-CM | POA: Diagnosis not present

## 2013-07-02 DIAGNOSIS — I771 Stricture of artery: Secondary | ICD-10-CM | POA: Diagnosis not present

## 2013-07-02 DIAGNOSIS — I4460 Unspecified fascicular block: Secondary | ICD-10-CM | POA: Diagnosis not present

## 2013-07-02 DIAGNOSIS — D649 Anemia, unspecified: Secondary | ICD-10-CM | POA: Diagnosis not present

## 2013-07-02 DIAGNOSIS — Z880 Allergy status to penicillin: Secondary | ICD-10-CM | POA: Diagnosis not present

## 2013-07-02 DIAGNOSIS — Z954 Presence of other heart-valve replacement: Secondary | ICD-10-CM | POA: Diagnosis not present

## 2013-07-02 DIAGNOSIS — Z79899 Other long term (current) drug therapy: Secondary | ICD-10-CM | POA: Diagnosis not present

## 2013-07-02 DIAGNOSIS — E291 Testicular hypofunction: Secondary | ICD-10-CM | POA: Diagnosis not present

## 2013-07-02 DIAGNOSIS — M069 Rheumatoid arthritis, unspecified: Secondary | ICD-10-CM | POA: Diagnosis not present

## 2013-07-02 DIAGNOSIS — E875 Hyperkalemia: Secondary | ICD-10-CM | POA: Diagnosis not present

## 2013-07-02 DIAGNOSIS — I658 Occlusion and stenosis of other precerebral arteries: Secondary | ICD-10-CM | POA: Diagnosis not present

## 2013-07-02 DIAGNOSIS — Z7982 Long term (current) use of aspirin: Secondary | ICD-10-CM | POA: Diagnosis not present

## 2013-07-02 DIAGNOSIS — I129 Hypertensive chronic kidney disease with stage 1 through stage 4 chronic kidney disease, or unspecified chronic kidney disease: Secondary | ICD-10-CM | POA: Diagnosis not present

## 2013-07-02 DIAGNOSIS — I440 Atrioventricular block, first degree: Secondary | ICD-10-CM | POA: Diagnosis not present

## 2013-07-02 DIAGNOSIS — E785 Hyperlipidemia, unspecified: Secondary | ICD-10-CM | POA: Diagnosis not present

## 2013-07-02 DIAGNOSIS — Z9889 Other specified postprocedural states: Secondary | ICD-10-CM | POA: Diagnosis not present

## 2013-07-02 DIAGNOSIS — R82998 Other abnormal findings in urine: Secondary | ICD-10-CM | POA: Diagnosis not present

## 2013-07-02 DIAGNOSIS — I779 Disorder of arteries and arterioles, unspecified: Secondary | ICD-10-CM | POA: Diagnosis not present

## 2013-07-02 DIAGNOSIS — Z885 Allergy status to narcotic agent status: Secondary | ICD-10-CM | POA: Diagnosis not present

## 2013-07-03 DIAGNOSIS — I779 Disorder of arteries and arterioles, unspecified: Secondary | ICD-10-CM | POA: Diagnosis not present

## 2013-07-03 DIAGNOSIS — N289 Disorder of kidney and ureter, unspecified: Secondary | ICD-10-CM | POA: Diagnosis not present

## 2013-07-03 DIAGNOSIS — G459 Transient cerebral ischemic attack, unspecified: Secondary | ICD-10-CM | POA: Diagnosis not present

## 2013-07-03 DIAGNOSIS — I10 Essential (primary) hypertension: Secondary | ICD-10-CM | POA: Diagnosis not present

## 2013-07-03 DIAGNOSIS — G458 Other transient cerebral ischemic attacks and related syndromes: Secondary | ICD-10-CM | POA: Diagnosis not present

## 2013-07-04 DIAGNOSIS — E785 Hyperlipidemia, unspecified: Secondary | ICD-10-CM | POA: Diagnosis not present

## 2013-07-04 DIAGNOSIS — D649 Anemia, unspecified: Secondary | ICD-10-CM | POA: Diagnosis not present

## 2013-07-04 DIAGNOSIS — Z79899 Other long term (current) drug therapy: Secondary | ICD-10-CM | POA: Diagnosis not present

## 2013-07-04 DIAGNOSIS — R5383 Other fatigue: Secondary | ICD-10-CM | POA: Diagnosis not present

## 2013-07-04 DIAGNOSIS — E559 Vitamin D deficiency, unspecified: Secondary | ICD-10-CM | POA: Diagnosis not present

## 2013-07-04 DIAGNOSIS — N401 Enlarged prostate with lower urinary tract symptoms: Secondary | ICD-10-CM | POA: Diagnosis not present

## 2013-07-04 DIAGNOSIS — M2550 Pain in unspecified joint: Secondary | ICD-10-CM | POA: Diagnosis not present

## 2013-07-04 DIAGNOSIS — R809 Proteinuria, unspecified: Secondary | ICD-10-CM | POA: Diagnosis not present

## 2013-07-04 DIAGNOSIS — R5381 Other malaise: Secondary | ICD-10-CM | POA: Diagnosis not present

## 2013-07-04 DIAGNOSIS — N19 Unspecified kidney failure: Secondary | ICD-10-CM | POA: Diagnosis not present

## 2013-07-05 DIAGNOSIS — G459 Transient cerebral ischemic attack, unspecified: Secondary | ICD-10-CM | POA: Diagnosis not present

## 2013-07-05 DIAGNOSIS — I2510 Atherosclerotic heart disease of native coronary artery without angina pectoris: Secondary | ICD-10-CM | POA: Diagnosis not present

## 2013-07-05 DIAGNOSIS — Z954 Presence of other heart-valve replacement: Secondary | ICD-10-CM | POA: Diagnosis not present

## 2013-07-05 DIAGNOSIS — I712 Thoracic aortic aneurysm, without rupture, unspecified: Secondary | ICD-10-CM | POA: Diagnosis not present

## 2013-07-05 DIAGNOSIS — I4891 Unspecified atrial fibrillation: Secondary | ICD-10-CM | POA: Diagnosis not present

## 2013-07-09 DIAGNOSIS — I6529 Occlusion and stenosis of unspecified carotid artery: Secondary | ICD-10-CM | POA: Diagnosis not present

## 2013-07-19 DIAGNOSIS — M898X9 Other specified disorders of bone, unspecified site: Secondary | ICD-10-CM | POA: Diagnosis not present

## 2013-07-19 DIAGNOSIS — L97509 Non-pressure chronic ulcer of other part of unspecified foot with unspecified severity: Secondary | ICD-10-CM | POA: Diagnosis not present

## 2013-07-19 DIAGNOSIS — M205X9 Other deformities of toe(s) (acquired), unspecified foot: Secondary | ICD-10-CM | POA: Diagnosis not present

## 2013-08-01 DIAGNOSIS — E559 Vitamin D deficiency, unspecified: Secondary | ICD-10-CM | POA: Diagnosis not present

## 2013-08-01 DIAGNOSIS — M2550 Pain in unspecified joint: Secondary | ICD-10-CM | POA: Diagnosis not present

## 2013-08-01 DIAGNOSIS — I2510 Atherosclerotic heart disease of native coronary artery without angina pectoris: Secondary | ICD-10-CM | POA: Diagnosis not present

## 2013-08-01 DIAGNOSIS — R5383 Other fatigue: Secondary | ICD-10-CM | POA: Diagnosis not present

## 2013-08-01 DIAGNOSIS — R5381 Other malaise: Secondary | ICD-10-CM | POA: Diagnosis not present

## 2013-08-01 DIAGNOSIS — E782 Mixed hyperlipidemia: Secondary | ICD-10-CM | POA: Diagnosis not present

## 2013-08-01 DIAGNOSIS — D649 Anemia, unspecified: Secondary | ICD-10-CM | POA: Diagnosis not present

## 2013-08-01 DIAGNOSIS — E538 Deficiency of other specified B group vitamins: Secondary | ICD-10-CM | POA: Diagnosis not present

## 2013-08-01 DIAGNOSIS — R0602 Shortness of breath: Secondary | ICD-10-CM | POA: Diagnosis not present

## 2013-08-01 DIAGNOSIS — M62838 Other muscle spasm: Secondary | ICD-10-CM | POA: Diagnosis not present

## 2013-08-01 DIAGNOSIS — IMO0001 Reserved for inherently not codable concepts without codable children: Secondary | ICD-10-CM | POA: Diagnosis not present

## 2013-08-01 DIAGNOSIS — R1011 Right upper quadrant pain: Secondary | ICD-10-CM | POA: Diagnosis not present

## 2013-08-01 DIAGNOSIS — E291 Testicular hypofunction: Secondary | ICD-10-CM | POA: Diagnosis not present

## 2013-08-01 DIAGNOSIS — I10 Essential (primary) hypertension: Secondary | ICD-10-CM | POA: Diagnosis not present

## 2013-08-13 DIAGNOSIS — N401 Enlarged prostate with lower urinary tract symptoms: Secondary | ICD-10-CM | POA: Diagnosis not present

## 2013-08-13 DIAGNOSIS — N139 Obstructive and reflux uropathy, unspecified: Secondary | ICD-10-CM | POA: Diagnosis not present

## 2013-08-13 DIAGNOSIS — N138 Other obstructive and reflux uropathy: Secondary | ICD-10-CM | POA: Diagnosis not present

## 2013-08-13 DIAGNOSIS — E291 Testicular hypofunction: Secondary | ICD-10-CM | POA: Diagnosis not present

## 2013-09-03 DIAGNOSIS — D649 Anemia, unspecified: Secondary | ICD-10-CM | POA: Diagnosis not present

## 2013-09-03 DIAGNOSIS — N19 Unspecified kidney failure: Secondary | ICD-10-CM | POA: Diagnosis not present

## 2013-09-03 DIAGNOSIS — E785 Hyperlipidemia, unspecified: Secondary | ICD-10-CM | POA: Diagnosis not present

## 2013-09-03 DIAGNOSIS — R7989 Other specified abnormal findings of blood chemistry: Secondary | ICD-10-CM | POA: Diagnosis not present

## 2013-09-03 DIAGNOSIS — M069 Rheumatoid arthritis, unspecified: Secondary | ICD-10-CM | POA: Diagnosis not present

## 2013-09-03 DIAGNOSIS — R5383 Other fatigue: Secondary | ICD-10-CM | POA: Diagnosis not present

## 2013-09-03 DIAGNOSIS — I6529 Occlusion and stenosis of unspecified carotid artery: Secondary | ICD-10-CM | POA: Diagnosis not present

## 2013-09-03 DIAGNOSIS — I10 Essential (primary) hypertension: Secondary | ICD-10-CM | POA: Diagnosis not present

## 2013-09-03 DIAGNOSIS — E871 Hypo-osmolality and hyponatremia: Secondary | ICD-10-CM | POA: Diagnosis not present

## 2013-09-03 DIAGNOSIS — R5381 Other malaise: Secondary | ICD-10-CM | POA: Diagnosis not present

## 2013-09-03 DIAGNOSIS — M2550 Pain in unspecified joint: Secondary | ICD-10-CM | POA: Diagnosis not present

## 2013-09-03 DIAGNOSIS — R809 Proteinuria, unspecified: Secondary | ICD-10-CM | POA: Diagnosis not present

## 2013-09-06 DIAGNOSIS — G609 Hereditary and idiopathic neuropathy, unspecified: Secondary | ICD-10-CM | POA: Diagnosis not present

## 2013-09-06 DIAGNOSIS — M069 Rheumatoid arthritis, unspecified: Secondary | ICD-10-CM | POA: Diagnosis not present

## 2013-09-06 DIAGNOSIS — Z79899 Other long term (current) drug therapy: Secondary | ICD-10-CM | POA: Diagnosis not present

## 2013-09-06 DIAGNOSIS — M1990 Unspecified osteoarthritis, unspecified site: Secondary | ICD-10-CM | POA: Diagnosis not present

## 2013-09-11 DIAGNOSIS — N139 Obstructive and reflux uropathy, unspecified: Secondary | ICD-10-CM | POA: Diagnosis not present

## 2013-09-11 DIAGNOSIS — N138 Other obstructive and reflux uropathy: Secondary | ICD-10-CM | POA: Diagnosis not present

## 2013-09-11 DIAGNOSIS — N401 Enlarged prostate with lower urinary tract symptoms: Secondary | ICD-10-CM | POA: Diagnosis not present

## 2013-09-11 DIAGNOSIS — E291 Testicular hypofunction: Secondary | ICD-10-CM | POA: Diagnosis not present

## 2013-09-24 DIAGNOSIS — I712 Thoracic aortic aneurysm, without rupture, unspecified: Secondary | ICD-10-CM | POA: Diagnosis not present

## 2013-09-24 DIAGNOSIS — I4891 Unspecified atrial fibrillation: Secondary | ICD-10-CM | POA: Diagnosis not present

## 2013-09-24 DIAGNOSIS — E785 Hyperlipidemia, unspecified: Secondary | ICD-10-CM | POA: Diagnosis not present

## 2013-09-24 DIAGNOSIS — I2510 Atherosclerotic heart disease of native coronary artery without angina pectoris: Secondary | ICD-10-CM | POA: Diagnosis not present

## 2013-09-25 DIAGNOSIS — D649 Anemia, unspecified: Secondary | ICD-10-CM | POA: Diagnosis not present

## 2013-09-25 DIAGNOSIS — I2510 Atherosclerotic heart disease of native coronary artery without angina pectoris: Secondary | ICD-10-CM | POA: Diagnosis not present

## 2013-09-25 DIAGNOSIS — L97509 Non-pressure chronic ulcer of other part of unspecified foot with unspecified severity: Secondary | ICD-10-CM | POA: Diagnosis not present

## 2013-09-25 DIAGNOSIS — E782 Mixed hyperlipidemia: Secondary | ICD-10-CM | POA: Diagnosis not present

## 2013-09-25 DIAGNOSIS — M069 Rheumatoid arthritis, unspecified: Secondary | ICD-10-CM | POA: Diagnosis not present

## 2013-09-26 DIAGNOSIS — M79609 Pain in unspecified limb: Secondary | ICD-10-CM | POA: Diagnosis not present

## 2013-09-26 DIAGNOSIS — L97509 Non-pressure chronic ulcer of other part of unspecified foot with unspecified severity: Secondary | ICD-10-CM | POA: Diagnosis not present

## 2013-09-26 DIAGNOSIS — I739 Peripheral vascular disease, unspecified: Secondary | ICD-10-CM | POA: Diagnosis not present

## 2013-11-27 ENCOUNTER — Encounter: Payer: Self-pay | Admitting: Cardiovascular Disease

## 2013-11-27 ENCOUNTER — Ambulatory Visit (INDEPENDENT_AMBULATORY_CARE_PROVIDER_SITE_OTHER): Payer: Medicare Other | Admitting: Cardiovascular Disease

## 2013-11-27 ENCOUNTER — Other Ambulatory Visit (HOSPITAL_COMMUNITY): Payer: Self-pay | Admitting: *Deleted

## 2013-11-27 VITALS — BP 170/84 | HR 58 | Ht 72.0 in | Wt 192.8 lb

## 2013-11-27 DIAGNOSIS — I4891 Unspecified atrial fibrillation: Secondary | ICD-10-CM

## 2013-11-27 DIAGNOSIS — Z954 Presence of other heart-valve replacement: Secondary | ICD-10-CM | POA: Diagnosis not present

## 2013-11-27 DIAGNOSIS — Z9889 Other specified postprocedural states: Secondary | ICD-10-CM | POA: Diagnosis not present

## 2013-11-27 DIAGNOSIS — Z952 Presence of prosthetic heart valve: Secondary | ICD-10-CM | POA: Insufficient documentation

## 2013-11-27 DIAGNOSIS — Q231 Congenital insufficiency of aortic valve: Secondary | ICD-10-CM

## 2013-11-27 DIAGNOSIS — I2510 Atherosclerotic heart disease of native coronary artery without angina pectoris: Secondary | ICD-10-CM

## 2013-11-27 DIAGNOSIS — E785 Hyperlipidemia, unspecified: Secondary | ICD-10-CM | POA: Diagnosis not present

## 2013-11-27 DIAGNOSIS — I359 Nonrheumatic aortic valve disorder, unspecified: Secondary | ICD-10-CM

## 2013-11-27 DIAGNOSIS — I10 Essential (primary) hypertension: Secondary | ICD-10-CM

## 2013-11-27 DIAGNOSIS — I480 Paroxysmal atrial fibrillation: Secondary | ICD-10-CM | POA: Insufficient documentation

## 2013-11-27 MED ORDER — LISINOPRIL 20 MG PO TABS: 20.0000 mg | ORAL_TABLET | Freq: Every day | ORAL | Status: DC

## 2013-11-27 MED ORDER — SIMVASTATIN 40 MG PO TABS: 40.0000 mg | ORAL_TABLET | Freq: Every day | ORAL | Status: DC

## 2013-11-27 MED ORDER — LISINOPRIL-HYDROCHLOROTHIAZIDE 20-25 MG PO TABS: 1.0000 | ORAL_TABLET | Freq: Every day | ORAL | Status: DC

## 2013-11-27 MED ORDER — FENOFIBRATE 160 MG PO TABS: 160.0000 mg | ORAL_TABLET | Freq: Every day | ORAL | Status: DC

## 2013-11-27 NOTE — Assessment & Plan Note (Signed)
Blood pressure is elevated today. He reports systolic pressures [phone] at home. Suggested he closely monitor his pressure and call our office with numbers. Strongly recommended aggressive management of his blood pressure given his aortic surgery

## 2013-11-27 NOTE — Assessment & Plan Note (Signed)
Cholesterol is at goal on the current lipid regimen. No changes to the medications were made.  

## 2013-11-27 NOTE — Assessment & Plan Note (Signed)
Currently with no symptoms of angina. No further workup at this time. Continue current medication regimen. 

## 2013-11-27 NOTE — Assessment & Plan Note (Signed)
Mild murmur appreciated on exam. Echocardiogram has been ordered to evaluate his aorta and bioprosthetic valve

## 2013-11-27 NOTE — Patient Instructions (Addendum)
You are doing well. Please decrease the simvastatin to 40 mg daily  We will schedule an echocardiogram for AVR, aortic graft repair  Please call us if you have new issues that need to be addressed before your next appt.  Your physician wants you to follow-up in: 6 months.  You will receive a reminder letter in the mail two months in advance. If you don't receive a letter, please call our office to schedule the follow-up appointment.

## 2013-11-27 NOTE — Assessment & Plan Note (Signed)
Echocardiogram ordered to evaluate his aorta repair. If we are unable to evaluate the aorta well the could consider CT scan of the chest with contrast

## 2013-11-27 NOTE — Progress Notes (Signed)
Patient ID: Bryan [name], male    DOB: Oct 14, 1940, 73 y.o.   MRN: [id number]  HPI Comments: Bryan [name] is a pleasant 73 year old gentleman with a history of Ascending aorta aneurysm, bicuspid aortic valve noted in 2009 with cardiac catheterization at that time showing mild to moderate CAD of the details are unavailable, who underwent aVR with bioprosthetic valve, and ascending aorta grafting, history of hypertension, hyperlipidemia, mild chronic renal insufficiency, total knee replacement in 2004, appendix rupture and gallbladder surgery in 2010, who presents to establish care in the [place] office. Remote history of postoperative atrial fibrillation and 2010 He is a retired Software engineer  Overall he reports that he is feeling well. He is active but does not do any regular exercise. His wife has other medical issues. Last echocardiogram in 2014 evaluate his valve and his graft. Notes indicate some degree of carotid disease though details are not available.  He has been monitoring his blood pressure periodically at home. Typically systolic pressure in the 962-952 range. Lab work this year showing hemoglobin A1c 6.0, BUN 38, creatinine 1.54, total cholesterol 122, LDL 58, HDL 50  EKG shows normal sinus rhythm with rate 58 beats per minute, unable to exclude old anterior infarct, left axis deviation.   Outpatient Encounter Prescriptions as of 11/27/2013  Medication Sig  . aspirin 325 MG tablet Take 325 mg by mouth daily.  . brimonidine (ALPHAGAN) 0.2 % ophthalmic solution Place 1 drop into both eyes daily.  . cetirizine (ZYRTEC) 10 MG tablet Take 10 mg by mouth daily.  . Cholecalciferol (VITAMIN D) 400 UNITS capsule Take 400 Units by mouth daily.  Marland Kitchen docusate calcium (SURFAK) 240 MG capsule Take 240 mg by mouth daily.  . fenofibrate 160 MG tablet Take 1 tablet (160 mg total) by mouth daily.  . fentaNYL (DURAGESIC - DOSED MCG/HR) 25 MCG/HR patch Place 25 mcg onto the skin every 3 (three)  days.  Marland Kitchen FLUoxetine (PROZAC) 20 MG capsule Take 20 mg by mouth daily.   . folic acid (FOLVITE) 1 MG tablet Take 1 mg by mouth daily.  Marland Kitchen gabapentin (NEURONTIN) 800 MG tablet Take 800 mg by mouth 3 (three) times daily as needed.   Marland Kitchen HYDROcodone-acetaminophen (NORCO/VICODIN) 5-325 MG per tablet Take 1 tablet by mouth every 6 (six) hours as needed for moderate pain.  Marland Kitchen lisinopril (PRINIVIL,ZESTRIL) 20 MG tablet Take 1 tablet (20 mg total) by mouth daily.  Marland Kitchen lisinopril-hydrochlorothiazide (PRINZIDE,ZESTORETIC) 20-25 MG per tablet Take 1 tablet by mouth daily.  . Magnesium 400 MG TABS Take 400 mg by mouth daily.  . methotrexate (RHEUMATREX) 2.5 MG tablet Take 7.5 mg by mouth once a week.  . predniSONE (DELTASONE) 5 MG tablet Take 5 mg by mouth as needed.   . simvastatin (ZOCOR) 40 MG tablet Take 1 tablet (40 mg total) by mouth daily.  . tamsulosin (FLOMAX) 0.4 MG CAPS capsule Take 0.4 mg by mouth daily.   . vitamin B-12 (CYANOCOBALAMIN) 1000 MCG tablet Take 1,000 mcg by mouth daily.    Review of Systems  Constitutional: Negative.   HENT: Negative.   Eyes: Negative.   Respiratory: Negative.   Cardiovascular: Negative.   Gastrointestinal: Negative.   Endocrine: Negative.   Musculoskeletal: Negative.   Skin: Negative.   Allergic/Immunologic: Negative.   Neurological: Negative.   Hematological: Negative.   Psychiatric/Behavioral: Negative.   All other systems reviewed and are negative.   BP 170/84  Pulse 58  Ht 6' (1.829 m)  Wt 192 lb 12 oz (87.431  kg)  BMI 26.14 kg/m2  Physical Exam  Nursing note and vitals reviewed. Constitutional: He is oriented to person, place, and time. He appears well-developed and well-nourished.  HENT:  Head: Normocephalic.  Nose: Nose normal.  Mouth/Throat: Oropharynx is clear and moist.  Eyes: Conjunctivae are normal. Pupils are equal, round, and reactive to light.  Neck: Normal range of motion. Neck supple. No JVD present.  Cardiovascular: Normal  rate, regular rhythm, S1 normal, S2 normal and intact distal pulses.  Exam reveals no gallop and no friction rub.   Murmur heard.  Systolic murmur is present with a grade of 2/6  Pulmonary/Chest: Effort normal and breath sounds normal. No respiratory distress. He has no wheezes. He has no rales. He exhibits no tenderness.  Abdominal: Soft. Bowel sounds are normal. He exhibits no distension. There is no tenderness.  Musculoskeletal: Normal range of motion. He exhibits no edema and no tenderness.  Lymphadenopathy:    He has no cervical adenopathy.  Neurological: He is alert and oriented to person, place, and time. Coordination normal.  Skin: Skin is warm and dry. No rash noted. No erythema.  Psychiatric: He has a normal mood and affect. His behavior is normal. Judgment and thought content normal.      Assessment and Plan

## 2013-11-27 NOTE — Assessment & Plan Note (Signed)
Postoperative atrial fibrillation and 2010. No reported arrhythmia since that time

## 2013-11-27 NOTE — Assessment & Plan Note (Addendum)
Prior records reviewed from [facility] in his prior cardiologist. He reports prior history of bicuspid aortic valve. Aortic valve replacement in 2010, bioprosthetic valve. 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

## 2013-12-03 ENCOUNTER — Other Ambulatory Visit: Payer: Self-pay

## 2013-12-03 ENCOUNTER — Encounter: Payer: Self-pay | Admitting: Cardiovascular Disease

## 2013-12-11 ENCOUNTER — Other Ambulatory Visit (INDEPENDENT_AMBULATORY_CARE_PROVIDER_SITE_OTHER): Payer: Medicare Other

## 2013-12-11 ENCOUNTER — Other Ambulatory Visit: Payer: Self-pay

## 2013-12-11 DIAGNOSIS — I359 Nonrheumatic aortic valve disorder, unspecified: Secondary | ICD-10-CM

## 2013-12-11 DIAGNOSIS — I4891 Unspecified atrial fibrillation: Secondary | ICD-10-CM | POA: Diagnosis not present

## 2013-12-13 ENCOUNTER — Encounter: Payer: Self-pay | Admitting: Adult Health

## 2013-12-13 ENCOUNTER — Ambulatory Visit (INDEPENDENT_AMBULATORY_CARE_PROVIDER_SITE_OTHER)
Admission: RE | Admit: 2013-12-13 | Discharge: 2013-12-13 | Disposition: A | Discharge status: Home / Self Care | Payer: Medicare Other | Source: Ambulatory Visit | Attending: Adult Health | Admitting: Adult Health

## 2013-12-13 ENCOUNTER — Ambulatory Visit (INDEPENDENT_AMBULATORY_CARE_PROVIDER_SITE_OTHER): Payer: Medicare Other | Admitting: Adult Health

## 2013-12-13 ENCOUNTER — Telehealth: Payer: Self-pay

## 2013-12-13 VITALS — BP 105/68 | HR 60 | Temp 97.8°F | Resp 14 | Ht 72.0 in | Wt 193.5 lb

## 2013-12-13 DIAGNOSIS — I77810 Thoracic aortic ectasia: Secondary | ICD-10-CM

## 2013-12-13 DIAGNOSIS — L539 Erythematous condition, unspecified: Secondary | ICD-10-CM

## 2013-12-13 DIAGNOSIS — Z23 Encounter for immunization: Secondary | ICD-10-CM

## 2013-12-13 DIAGNOSIS — G8929 Other chronic pain: Secondary | ICD-10-CM | POA: Diagnosis not present

## 2013-12-13 DIAGNOSIS — Z953 Presence of xenogenic heart valve: Secondary | ICD-10-CM

## 2013-12-13 DIAGNOSIS — I2510 Atherosclerotic heart disease of native coronary artery without angina pectoris: Secondary | ICD-10-CM

## 2013-12-13 DIAGNOSIS — M19079 Primary osteoarthritis, unspecified ankle and foot: Secondary | ICD-10-CM | POA: Diagnosis not present

## 2013-12-13 MED ORDER — TAMSULOSIN HCL 0.4 MG PO CAPS: 0.4000 mg | ORAL_CAPSULE | Freq: Every day | ORAL | Status: DC

## 2013-12-13 MED ORDER — CEPHALEXIN 500 MG PO CAPS: 500.0000 mg | ORAL_CAPSULE | Freq: Two times a day (BID) | ORAL | Status: DC

## 2013-12-13 NOTE — Patient Instructions (Signed)
  Please go to our [facility] office for the xray of your toe.  Start Cephalexin 500 mg twice a day for 10 days.  I will call you with results of xray once available.  Return in early September to follow up on lipids and vitamin D  You were given a pneumonia vaccine (prevnar) and tetanus with pertussis.

## 2013-12-13 NOTE — Telephone Encounter (Signed)
Pt called back regarding recent echo results.  He states that he does not wish to sched a f/u to discuss, would prefer to set up echo for next year. Advised him that I will order put order in and we will send him letter to set this up next year.

## 2013-12-14 NOTE — Progress Notes (Signed)
Patient ID: Bryan [name], male   DOB: 11-Mar-1941, 73 y.o.   MRN: [id number]   Subjective:    Patient ID: Bryan [name], male    DOB: 06-07-1940, 72 y.o.   MRN: [id number]  HPI  Pt presents to clinic to establish care. [place] Dr. [name], Cardiology. Pt with hx of RA on chronic narcotics. He has appt with Dr. [name] in the beginning of August. I will request records from his previous PCP. Needs tetanus vaccine with pertussin (never had this). Needs Prevnar.  Past Medical History  Diagnosis Date  . Coronary artery disease   . Hyperlipidemia   . H/O thoracic aortic aneurysm repair   . Valvular heart disease   . Paroxysmal a-fib   . Hypertension   . Arrhythmia   . Glaucoma   . Arthritis   . Depression   . Allergy   . History of blood transfusion   . History of chicken pox     Current Outpatient Prescriptions on File Prior to Visit  Medication Sig Dispense Refill  . aspirin 325 MG tablet Take 325 mg by mouth daily.      . brimonidine (ALPHAGAN) 0.2 % ophthalmic solution Place 1 drop into both eyes daily.      . cetirizine (ZYRTEC) 10 MG tablet Take 10 mg by mouth daily.      . Cholecalciferol (VITAMIN D) 400 UNITS capsule Take 400 Units by mouth daily.      Marland Kitchen docusate calcium (SURFAK) 240 MG capsule Take 240 mg by mouth daily.      . fenofibrate 160 MG tablet Take 1 tablet (160 mg total) by mouth daily.  90 tablet  3  . fentaNYL (DURAGESIC - DOSED MCG/HR) 25 MCG/HR patch Place 25 mcg onto the skin every 3 (three) days.      Marland Kitchen FLUoxetine (PROZAC) 20 MG capsule Take 20 mg by mouth daily.       . folic acid (FOLVITE) 1 MG tablet Take 1 mg by mouth daily.      Marland Kitchen gabapentin (NEURONTIN) 800 MG tablet Take 800 mg by mouth 3 (three) times daily as needed.       Marland Kitchen HYDROcodone-acetaminophen (NORCO/VICODIN) 5-325 MG per tablet Take 1 tablet by mouth every 6 (six) hours as needed for moderate pain.      Marland Kitchen lisinopril  (PRINIVIL,ZESTRIL) 20 MG tablet Take 1 tablet (20 mg total) by mouth daily.  90 tablet  3  . lisinopril-hydrochlorothiazide (PRINZIDE,ZESTORETIC) 20-25 MG per tablet Take 1 tablet by mouth daily.  90 tablet  3  . Magnesium 400 MG TABS Take 400 mg by mouth daily.      . methotrexate (RHEUMATREX) 2.5 MG tablet Take 7.5 mg by mouth once a week.      . predniSONE (DELTASONE) 5 MG tablet Take 5 mg by mouth as needed.       . simvastatin (ZOCOR) 40 MG tablet Take 1 tablet (40 mg total) by mouth daily.  90 tablet  3  . vitamin B-12 (CYANOCOBALAMIN) 1000 MCG tablet Take 1,000 mcg by mouth daily.       No current facility-administered medications on file prior to visit.     Review of Systems  Constitutional: Negative.   HENT: Negative.   Eyes: Negative.   Respiratory: Negative.   Cardiovascular: Negative.   Gastrointestinal: Negative.   Endocrine: Negative.   Genitourinary: Negative.   Musculoskeletal: Positive for arthralgias (RA) and joint swelling (  2nd toe left foot).  Skin: Negative.        Redness of second toe left foot  Allergic/Immunologic: Negative.   Neurological: Negative.   Hematological: Negative.   Psychiatric/Behavioral: Negative.        Objective:  BP 105/68  Pulse 60  Temp(Src) 97.8 F (36.6 C) (Oral)  Resp 14  Ht 6' (1.829 m)  Wt 193 lb 8 oz (87.771 kg)  BMI 26.24 kg/m2  SpO2 96%   Physical Exam  Constitutional: He is oriented to person, place, and time. He appears well-developed and well-nourished. No distress.  HENT:  Head: Normocephalic and atraumatic.  Eyes: Conjunctivae and EOM are normal.  Neck: Neck supple.  Cardiovascular: Normal rate and regular rhythm.   Pulmonary/Chest: Effort normal. No respiratory distress.  Musculoskeletal: Normal range of motion. He exhibits edema.  Swelling and erythema of 2nd toe on left foot.   Neurological: He is alert and oriented to person, place, and time. Coordination normal.  Skin: Skin is warm and dry.    Psychiatric: He has a normal mood and affect. His behavior is normal. Judgment and thought content normal.      Assessment & Plan:   1. Erythema Left foot 2nd toe with redness and swelling. Will send for xray to r/o bony involvement. STart Keflex bid x 7 days. - DG Toe 2nd Left; Future  2. Chronic pain Secondary to RA. Has appt with Dr. [name] at the beginning of August. Discussed referral to pain clinic if Dr. [name] is not willing to manage pain associated with RA. Pt agreed. - Ambulatory referral to Pain Clinic  3. Need for Tdap vaccination Given in clinic today. - Tdap vaccine greater than or equal to 7yo IM  4. Need for vaccination with 13-polyvalent pneumococcal conjugate vaccine Given in clinic today. - Pneumococcal conjugate vaccine 13-valent

## 2013-12-26 DIAGNOSIS — Z79899 Other long term (current) drug therapy: Secondary | ICD-10-CM | POA: Diagnosis not present

## 2013-12-26 DIAGNOSIS — G609 Hereditary and idiopathic neuropathy, unspecified: Secondary | ICD-10-CM | POA: Diagnosis not present

## 2013-12-26 DIAGNOSIS — M069 Rheumatoid arthritis, unspecified: Secondary | ICD-10-CM | POA: Diagnosis not present

## 2013-12-26 DIAGNOSIS — N189 Chronic kidney disease, unspecified: Secondary | ICD-10-CM | POA: Diagnosis not present

## 2014-01-03 ENCOUNTER — Telehealth: Payer: Self-pay | Admitting: Adult Health

## 2014-01-03 ENCOUNTER — Other Ambulatory Visit: Payer: Self-pay | Admitting: Adult Health

## 2014-01-03 DIAGNOSIS — R7989 Other specified abnormal findings of blood chemistry: Secondary | ICD-10-CM

## 2014-01-03 NOTE — Telephone Encounter (Signed)
Patient called asking about a referral to urology, no mention of urology referral in your last office note. Please advise.

## 2014-01-03 NOTE — Telephone Encounter (Signed)
Referral entered. Please ask him if he has seen urology in the past. If yes, I would like to get those records so that I can send with his referral.

## 2014-01-03 NOTE — Telephone Encounter (Signed)
Pt called about urology referral. There is no urology referral in the system. Please advise pt

## 2014-01-07 ENCOUNTER — Other Ambulatory Visit: Payer: Self-pay | Admitting: Adult Health

## 2014-01-07 NOTE — Telephone Encounter (Signed)
Ok refill? 

## 2014-01-07 NOTE — Telephone Encounter (Signed)
Patient has been seen by Dr. Anabel Bene for urology in the past. Records requested.

## 2014-01-08 ENCOUNTER — Other Ambulatory Visit: Payer: Self-pay | Admitting: Adult Health

## 2014-01-08 MED ORDER — GABAPENTIN 800 MG PO TABS: 800.0000 mg | ORAL_TABLET | Freq: Three times a day (TID) | ORAL | Status: DC | PRN

## 2014-01-14 DIAGNOSIS — M2040 Other hammer toe(s) (acquired), unspecified foot: Secondary | ICD-10-CM | POA: Diagnosis not present

## 2014-01-14 DIAGNOSIS — M898X9 Other specified disorders of bone, unspecified site: Secondary | ICD-10-CM | POA: Diagnosis not present

## 2014-01-14 DIAGNOSIS — A5211 Tabes dorsalis: Secondary | ICD-10-CM | POA: Diagnosis not present

## 2014-01-17 DIAGNOSIS — E291 Testicular hypofunction: Secondary | ICD-10-CM | POA: Diagnosis not present

## 2014-01-17 DIAGNOSIS — N400 Enlarged prostate without lower urinary tract symptoms: Secondary | ICD-10-CM | POA: Diagnosis not present

## 2014-01-17 DIAGNOSIS — N529 Male erectile dysfunction, unspecified: Secondary | ICD-10-CM | POA: Diagnosis not present

## 2014-01-24 ENCOUNTER — Ambulatory Visit: Payer: Self-pay | Admitting: Pain Medicine

## 2014-01-24 DIAGNOSIS — M069 Rheumatoid arthritis, unspecified: Secondary | ICD-10-CM | POA: Diagnosis not present

## 2014-01-24 DIAGNOSIS — M25519 Pain in unspecified shoulder: Secondary | ICD-10-CM | POA: Diagnosis not present

## 2014-01-24 DIAGNOSIS — M79609 Pain in unspecified limb: Secondary | ICD-10-CM | POA: Diagnosis not present

## 2014-01-24 DIAGNOSIS — IMO0002 Reserved for concepts with insufficient information to code with codable children: Secondary | ICD-10-CM | POA: Diagnosis not present

## 2014-01-24 DIAGNOSIS — M47817 Spondylosis without myelopathy or radiculopathy, lumbosacral region: Secondary | ICD-10-CM | POA: Diagnosis not present

## 2014-01-29 ENCOUNTER — Telehealth: Payer: Self-pay | Admitting: *Deleted

## 2014-01-29 ENCOUNTER — Other Ambulatory Visit: Payer: Medicare Other

## 2014-01-29 DIAGNOSIS — E291 Testicular hypofunction: Secondary | ICD-10-CM | POA: Diagnosis not present

## 2014-01-29 NOTE — Telephone Encounter (Signed)
Pt brought in a order form from Dorthula Matas, MD wanting creatinine and cbc w/auto diff Dx code: v58.69, 714.0

## 2014-01-29 NOTE — Telephone Encounter (Signed)
What labs and dx?  

## 2014-01-30 ENCOUNTER — Ambulatory Visit (INDEPENDENT_AMBULATORY_CARE_PROVIDER_SITE_OTHER): Payer: Medicare Other | Admitting: Adult Health

## 2014-01-30 ENCOUNTER — Encounter: Payer: Self-pay | Admitting: Adult Health

## 2014-01-30 ENCOUNTER — Ambulatory Visit: Payer: Self-pay | Admitting: Podiatry

## 2014-01-30 VITALS — BP 112/69 | HR 71 | Temp 97.9°F | Resp 14 | Ht 72.0 in | Wt 192.0 lb

## 2014-01-30 DIAGNOSIS — Z01812 Encounter for preprocedural laboratory examination: Secondary | ICD-10-CM | POA: Diagnosis not present

## 2014-01-30 DIAGNOSIS — E559 Vitamin D deficiency, unspecified: Secondary | ICD-10-CM | POA: Diagnosis not present

## 2014-01-30 DIAGNOSIS — M549 Dorsalgia, unspecified: Secondary | ICD-10-CM | POA: Insufficient documentation

## 2014-01-30 DIAGNOSIS — Z79899 Other long term (current) drug therapy: Secondary | ICD-10-CM

## 2014-01-30 DIAGNOSIS — I2510 Atherosclerotic heart disease of native coronary artery without angina pectoris: Secondary | ICD-10-CM | POA: Diagnosis not present

## 2014-01-30 DIAGNOSIS — M898X9 Other specified disorders of bone, unspecified site: Secondary | ICD-10-CM | POA: Diagnosis not present

## 2014-01-30 DIAGNOSIS — Z01818 Encounter for other preprocedural examination: Secondary | ICD-10-CM | POA: Diagnosis not present

## 2014-01-30 DIAGNOSIS — M2040 Other hammer toe(s) (acquired), unspecified foot: Secondary | ICD-10-CM | POA: Diagnosis not present

## 2014-01-30 DIAGNOSIS — M5416 Radiculopathy, lumbar region: Secondary | ICD-10-CM | POA: Insufficient documentation

## 2014-01-30 DIAGNOSIS — E785 Hyperlipidemia, unspecified: Secondary | ICD-10-CM | POA: Diagnosis not present

## 2014-01-30 DIAGNOSIS — M48061 Spinal stenosis, lumbar region without neurogenic claudication: Secondary | ICD-10-CM | POA: Insufficient documentation

## 2014-01-30 DIAGNOSIS — M069 Rheumatoid arthritis, unspecified: Secondary | ICD-10-CM | POA: Diagnosis not present

## 2014-01-30 DIAGNOSIS — G8929 Other chronic pain: Secondary | ICD-10-CM

## 2014-01-30 DIAGNOSIS — A5211 Tabes dorsalis: Secondary | ICD-10-CM | POA: Diagnosis not present

## 2014-01-30 LAB — CREATININE, SERUM: Creatinine, Ser: 1.6 mg/dL — ABNORMAL HIGH (ref 0.4–1.5)

## 2014-01-30 LAB — LIPID PANEL
Cholesterol: 158 mg/dL (ref 0–200)
HDL: 41.3 mg/dL (ref >39.00)
LDL Cholesterol: 93 mg/dL (ref 0–99)
NonHDL: 116.7
Total CHOL/HDL Ratio: 4
Triglycerides: 120 mg/dL (ref 0.0–149.0)
VLDL: 24 mg/dL (ref 0.0–40.0)

## 2014-01-30 LAB — CBC WITH DIFFERENTIAL/PLATELET
Basophils Absolute: 0 10*3/uL (ref 0.0–0.1)
Basophils Relative: 0.5 % (ref 0.0–3.0)
Eosinophils Absolute: 0.2 10*3/uL (ref 0.0–0.7)
Eosinophils Relative: 8.2 % — ABNORMAL HIGH (ref 0.0–5.0)
HCT: 32.3 % — ABNORMAL LOW (ref 39.0–52.0)
Hemoglobin: 10.8 g/dL — ABNORMAL LOW (ref 13.0–17.0)
Lymphocytes Relative: 16.8 % (ref 12.0–46.0)
Lymphs Abs: 0.5 10*3/uL — ABNORMAL LOW (ref 0.7–4.0)
MCHC: 33.4 g/dL (ref 30.0–36.0)
MCV: 96.4 fl (ref 78.0–100.0)
Monocytes Absolute: 0.5 10*3/uL (ref 0.1–1.0)
Monocytes Relative: 17.1 % — ABNORMAL HIGH (ref 3.0–12.0)
Neutro Abs: 1.7 10*3/uL (ref 1.4–7.7)
Neutrophils Relative %: 57.4 % (ref 43.0–77.0)
Platelets: 158 10*3/uL (ref 150.0–400.0)
RBC: 3.35 Mil/uL — ABNORMAL LOW (ref 4.22–5.81)
RDW: 14.5 % (ref 11.5–15.5)
WBC: 3 10*3/uL — ABNORMAL LOW (ref 4.0–10.5)

## 2014-01-30 LAB — VITAMIN D 25 HYDROXY (VIT D DEFICIENCY, FRACTURES): VITD: 35.52 ng/mL (ref 30.00–100.00)

## 2014-01-30 MED ORDER — FENTANYL 25 MCG/HR TD PT72: 25.0000 ug | MEDICATED_PATCH | TRANSDERMAL | Status: DC

## 2014-01-30 MED ORDER — GABAPENTIN 800 MG PO TABS: 800.0000 mg | ORAL_TABLET | Freq: Three times a day (TID) | ORAL | Status: DC | PRN

## 2014-01-30 MED ORDER — HYDROCODONE-ACETAMINOPHEN 5-325 MG PO TABS: 1.0000 | ORAL_TABLET | Freq: Four times a day (QID) | ORAL | Status: DC | PRN

## 2014-01-30 NOTE — Progress Notes (Signed)
Pre visit review using our clinic review tool, if applicable. No additional management support is needed unless otherwise documented below in the visit note. 

## 2014-01-30 NOTE — Progress Notes (Signed)
Patient ID: Bryan [name], male   DOB: 1940-06-17, 73 y.o.   MRN: [id number]   Subjective:    Patient ID: Bryan [name], male    DOB: 05-04-1941, 73 y.o.   MRN: [id number]  HPI  Pt is a pleasant 73 y/o male who presents to clinic for follow up:  1. HLD (hyperlipidemia) Reports taking his medication as prescribed. No side effects reports such as muscle aches.   2. Vitamin D deficiency History of deficiency. Will need to recheck levels today.   3. Encounter for long-term (current) use of other medications Pt is followed by Rheumatology for RA and has requested that we check additional labs: creatinine and CBC for the methotrexate he is on.   4. Chronic back pain Recently saw Bryan [name]. He is pleased with the pain clinic. Reports that they do not prescribe any pain medication for the initial 2 visits. He will need refill on his duragisic and hydrocodone for BTP.   Past Medical History  Diagnosis Date  . Coronary artery disease   . Hyperlipidemia   . H/O thoracic aortic aneurysm repair   . Valvular heart disease   . Paroxysmal a-fib   . Hypertension   . Arrhythmia   . Glaucoma   . Arthritis   . Depression   . Allergy   . History of blood transfusion   . History of chicken pox     Current Outpatient Prescriptions on File Prior to Visit  Medication Sig Dispense Refill  . aspirin 325 MG tablet Take 325 mg by mouth daily.      . brimonidine (ALPHAGAN) 0.2 % ophthalmic solution Place 1 drop into both eyes daily.      . cetirizine (ZYRTEC) 10 MG tablet Take 10 mg by mouth daily.      . Cholecalciferol (VITAMIN D) 400 UNITS capsule Take 400 Units by mouth daily.      Marland Kitchen docusate calcium (SURFAK) 240 MG capsule Take 240 mg by mouth daily.      . fenofibrate 160 MG tablet Take 1 tablet (160 mg total) by mouth daily.  90 tablet  3  . fentaNYL (DURAGESIC - DOSED MCG/HR) 25 MCG/HR patch Place 25 mcg onto the skin every 3 (three) days.      Marland Kitchen FLUoxetine (PROZAC) 20 MG  capsule Take 20 mg by mouth daily.       . folic acid (FOLVITE) 1 MG tablet Take 1 mg by mouth daily.      Marland Kitchen gabapentin (NEURONTIN) 800 MG tablet Take 1 tablet (800 mg total) by mouth 3 (three) times daily as needed.  90 tablet  3  . HYDROcodone-acetaminophen (NORCO/VICODIN) 5-325 MG per tablet Take 1 tablet by mouth every 6 (six) hours as needed for moderate pain.      Marland Kitchen lisinopril (PRINIVIL,ZESTRIL) 20 MG tablet Take 1 tablet (20 mg total) by mouth daily.  90 tablet  3  . lisinopril-hydrochlorothiazide (PRINZIDE,ZESTORETIC) 20-25 MG per tablet Take 1 tablet by mouth daily.  90 tablet  3  . Magnesium 400 MG TABS Take 400 mg by mouth daily.      . methotrexate (RHEUMATREX) 2.5 MG tablet Take 7.5 mg by mouth once a week.      . predniSONE (DELTASONE) 5 MG tablet Take 5 mg by mouth as needed.       . simvastatin (ZOCOR) 40 MG tablet Take 1 tablet (40 mg total) by mouth daily.  90 tablet  3  . tamsulosin (FLOMAX) 0.4 MG CAPS  capsule Take 1 capsule (0.4 mg total) by mouth daily.  90 capsule  3  . vitamin B-12 (CYANOCOBALAMIN) 1000 MCG tablet Take 1,000 mcg by mouth daily.       No current facility-administered medications on file prior to visit.     Review of Systems  Constitutional: Negative.   HENT: Negative.   Eyes: Negative.   Respiratory: Negative.   Cardiovascular: Negative.   Gastrointestinal: Negative.   Endocrine: Negative.   Genitourinary: Negative.   Musculoskeletal: Positive for back pain.       Followed at [name]  Skin: Negative.   Allergic/Immunologic: Negative.   Neurological: Negative.   Hematological: Negative.   Psychiatric/Behavioral: Negative.        Objective:  BP 112/69  Pulse 71  Temp(Src) 97.9 F (36.6 C) (Oral)  Resp 14  Ht 6' (1.829 m)  Wt 192 lb (87.091 kg)  BMI 26.03 kg/m2  SpO2 96%   Physical Exam  Constitutional: He is oriented to person, place, and time. He appears well-developed and well-nourished. No distress.    HENT:  Head: Normocephalic and atraumatic.  Eyes: Conjunctivae and EOM are normal.  Cardiovascular: Normal rate, regular rhythm, normal heart sounds and intact distal pulses.  Exam reveals no gallop and no friction rub.   No murmur heard. Pulmonary/Chest: Effort normal and breath sounds normal. No respiratory distress. He has no wheezes. He has no rales.  Musculoskeletal: Normal range of motion.  Neurological: He is alert and oriented to person, place, and time.  Skin: Skin is warm and dry.  Psychiatric: He has a normal mood and affect. His behavior is normal. Judgment and thought content normal.      Assessment & Plan:   1. HLD (hyperlipidemia) On statin. Check cholesterol. Follow - Lipid panel  2. Vitamin D deficiency Check vitamin D level. Supplement if necessary. Follow - Vitamin D (25 hydroxy)  3. Encounter for long-term (current) use of other medications Will order labs per Bryan [name] request to adjust methotrexate for his RA if necessary. - Creatinine - CBC with Differential  4. Chronic back pain He is currently controlled on his duragesic patch and hydrocodone for BTP. He is out of his medication and Bryan [name] will not start prescribing until his 62rd visit. Next visit they are going to try a nerve block. I will refill Duragesic and hydrocodone until taken over by Pain Clinic.

## 2014-01-31 ENCOUNTER — Other Ambulatory Visit: Payer: Self-pay | Admitting: Adult Health

## 2014-01-31 DIAGNOSIS — D649 Anemia, unspecified: Secondary | ICD-10-CM

## 2014-02-04 ENCOUNTER — Other Ambulatory Visit: Payer: Self-pay | Admitting: Pain Medicine

## 2014-02-04 ENCOUNTER — Ambulatory Visit: Payer: Self-pay | Admitting: Pain Medicine

## 2014-02-04 DIAGNOSIS — M546 Pain in thoracic spine: Secondary | ICD-10-CM | POA: Diagnosis not present

## 2014-02-04 DIAGNOSIS — IMO0002 Reserved for concepts with insufficient information to code with codable children: Secondary | ICD-10-CM | POA: Diagnosis not present

## 2014-02-04 DIAGNOSIS — M79609 Pain in unspecified limb: Secondary | ICD-10-CM | POA: Diagnosis not present

## 2014-02-04 LAB — APTT: Activated PTT: 32.6 secs (ref 23.6–35.9)

## 2014-02-04 LAB — PROTIME-INR
INR: 1
Prothrombin Time: 13.2 secs (ref 11.5–14.7)

## 2014-02-05 ENCOUNTER — Ambulatory Visit (INDEPENDENT_AMBULATORY_CARE_PROVIDER_SITE_OTHER): Payer: Medicare Other | Admitting: Adult Health

## 2014-02-05 ENCOUNTER — Telehealth: Payer: Self-pay

## 2014-02-05 VITALS — BP 108/58 | HR 73 | Temp 98.3°F | Ht 72.0 in | Wt 196.8 lb

## 2014-02-05 DIAGNOSIS — I2510 Atherosclerotic heart disease of native coronary artery without angina pectoris: Secondary | ICD-10-CM

## 2014-02-05 DIAGNOSIS — Z01818 Encounter for other preprocedural examination: Secondary | ICD-10-CM | POA: Diagnosis not present

## 2014-02-05 NOTE — Telephone Encounter (Signed)
Spoke w/ Charolette Forward, NP.  She states that pt is currently in her office.  Pt is sched for surgery on Fri w/ Dr. [name] on his left 5th and 2nd toe.  She performed EKG today that is unchanged from previous. Pt states that he believes anesthesia will be local, but she wanted to make sure that pt is cleared from a cardiac standpoint to proceed due to his history.  She would like a call back this morning, if possible, w/ Dr. Donivan Scull recommendation.

## 2014-02-05 NOTE — Progress Notes (Signed)
Pre visit review using our clinic review tool, if applicable. No additional management support is needed unless otherwise documented below in the visit note. 

## 2014-02-05 NOTE — Telephone Encounter (Signed)
Spoke to patient per Raquel's request. Patient stated he stopped taking his ASA 81mg  on Friday 02/01/14. Patient aslo stated that he broke out in hives when he took penicillin. He state that he takes Amoxicillin 500mg  4tabs po before every dental procedure and has never had a reaction to the medication. Please advise.

## 2014-02-05 NOTE — Telephone Encounter (Signed)
Pt will need antibiotic prior to surgery. Spoke with Dr. Donivan Scull nurse. Dr. [name] states patient is cleared for surgery from cardiac perspective but does need antibiotic. Would leave to discretion of Dr. [name]. I called Dr. [name] office and left message regarding this with receptionist (Dena). Also call Mr. Gamero home and spoke with his wife since he was not home. She was going to have Mr. Penson call Dr. [name] office in the morning to follow up about antibiotic which pt will need prior to surgery.

## 2014-02-05 NOTE — Telephone Encounter (Signed)
Acceptable risk for surgery No further testing needed Will require antibiotics prior to the procedure given his prosthetic valve Can use amoxicillin or whatever the podiatrist would like

## 2014-02-05 NOTE — Telephone Encounter (Signed)
Spoke w/ Raquel.  Advised her of Dr.Gollan's recommendation.  Asked her to call back if we can be of further assistance.

## 2014-02-05 NOTE — Telephone Encounter (Signed)
Kim from Dr. [name] office left a voicemail stating patient with have a light general anesthetic using LMA (laryngeal mask airway) and also a local anesthetic around his foot. Kim did not say the exact medication that would be used during the procedure. Please advise.

## 2014-02-05 NOTE — Progress Notes (Signed)
Patient ID: Bryan [name], male   DOB: [birth year], 73 y.o.   MRN: [id number]   Subjective:    Patient ID: Bryan [name], male    DOB: 1940/12/14, 73 y.o.   MRN: [id number]  HPI  Bryan [name] is a pleasant 73 y/o male who presents to clinic for preop check up. He has forms that need to be filled out. Surgery this Friday on his left 2nd toe and 5th toe. Pt reports Dr. [name] plans local anesthetic. Pt with his of CAD, HTN, HLD, thoracic aneurysm repair, valvular heart dz, paroxysmal afib. Reports no chest pain, shortness of breath. He reports having procedure yesterday at the pain clinic with use of conscious sedation (fentanyl and versed) and did very well.  Past Medical History  Diagnosis Date  . Coronary artery disease   . Hyperlipidemia   . H/O thoracic aortic aneurysm repair   . Valvular heart disease   . Paroxysmal a-fib   . Hypertension   . Arrhythmia   . Glaucoma   . Arthritis   . Depression   . Allergy   . History of blood transfusion   . History of chicken pox   . Chronic back pain     Current Outpatient Prescriptions on File Prior to Visit  Medication Sig Dispense Refill  . aspirin 325 MG tablet Take 325 mg by mouth daily.      . brimonidine (ALPHAGAN) 0.2 % ophthalmic solution Place 1 drop into both eyes daily.      . cetirizine (ZYRTEC) 10 MG tablet Take 10 mg by mouth daily.      . Cholecalciferol (VITAMIN D) 400 UNITS capsule Take 400 Units by mouth daily.      Marland Kitchen docusate calcium (SURFAK) 240 MG capsule Take 240 mg by mouth daily.      . fenofibrate 160 MG tablet Take 1 tablet (160 mg total) by mouth daily.  90 tablet  3  . fentaNYL (DURAGESIC - DOSED MCG/HR) 25 MCG/HR patch Place 1 patch (25 mcg total) onto the skin every 3 (three) days.  10 patch  0  . FLUoxetine (PROZAC) 20 MG capsule Take 20 mg by mouth daily.       . folic acid (FOLVITE) 1 MG tablet Take 1 mg by mouth daily.      Marland Kitchen gabapentin (NEURONTIN) 800 MG tablet Take 1 tablet (800 mg total) by  mouth 3 (three) times daily as needed.  270 tablet  3  . HYDROcodone-acetaminophen (NORCO/VICODIN) 5-325 MG per tablet Take 1 tablet by mouth every 6 (six) hours as needed for moderate pain.  60 tablet  0  . lisinopril (PRINIVIL,ZESTRIL) 20 MG tablet Take 1 tablet (20 mg total) by mouth daily.  90 tablet  3  . lisinopril-hydrochlorothiazide (PRINZIDE,ZESTORETIC) 20-25 MG per tablet Take 1 tablet by mouth daily.  90 tablet  3  . Magnesium 400 MG TABS Take 400 mg by mouth daily.      . methotrexate (RHEUMATREX) 2.5 MG tablet Take 7.5 mg by mouth once a week.      . predniSONE (DELTASONE) 5 MG tablet Take 5 mg by mouth as needed.       . simvastatin (ZOCOR) 40 MG tablet Take 1 tablet (40 mg total) by mouth daily.  90 tablet  3  . tamsulosin (FLOMAX) 0.4 MG CAPS capsule Take 1 capsule (0.4 mg total) by mouth daily.  90 capsule  3  . vitamin B-12 (CYANOCOBALAMIN) 1000 MCG tablet Take 1,000 mcg by mouth  daily.       No current facility-administered medications on file prior to visit.     Review of Systems  Constitutional: Negative.   Respiratory: Negative.  Negative for cough, chest tightness, shortness of breath and wheezing.   Cardiovascular: Negative.  Negative for chest pain, palpitations and leg swelling.  Gastrointestinal: Negative.   Genitourinary: Negative.   Musculoskeletal: Positive for back pain (hx of chronic pain. Stable. Followed at pain clinic).  Neurological: Negative for dizziness and light-headedness.  Psychiatric/Behavioral: Negative.   All other systems reviewed and are negative.      Objective:   BP 108/58  Pulse 73  Temp(Src) 98.3 F (36.8 C) (Oral)  Ht 6' (1.829 m)  Wt 196 lb 12 oz (89.245 kg)  BMI 26.68 kg/m2  SpO2 94%   Physical Exam  Constitutional: He is oriented to person, place, and time. He appears well-developed and well-nourished. No distress.  HENT:  Head: Normocephalic and atraumatic.  Eyes: Conjunctivae and EOM are normal.  Cardiovascular:  Normal rate, regular rhythm and intact distal pulses.  Exam reveals no gallop and no friction rub.   Murmur heard. Pulmonary/Chest: Effort normal and breath sounds normal. No respiratory distress. He has no wheezes. He has no rales.  Abdominal: Soft. Bowel sounds are normal.  Musculoskeletal: He exhibits no edema and no tenderness.  Neurological: He is alert and oriented to person, place, and time.  Skin: Skin is warm and dry.  Psychiatric: He has a normal mood and affect. His behavior is normal. Judgment and thought content normal.      Assessment & Plan:   1. Preop examination Pt is having surgery on his left 2nd and 5th toes under light general anesthesia using laryngeal mask airway. EKG shows no changes since his last one in July when he saw Dr. [name]. Discussion with cardiology with recommendation for antibiotic on day of surgery which will be written in the preop form. Dr. [name] states patient is ok to proceed with surgery and no further cardiac workup required at this time. Form filled out and submitted to Dr. [name] office. - EKG 12-Lead

## 2014-02-05 NOTE — Telephone Encounter (Signed)
Amoxicillin is what Dr. [name] recommended but he asked to let Dr. [name] determine what to give him. Please have him call Dr. [name] office first thing in the morning.

## 2014-02-06 ENCOUNTER — Encounter: Payer: Self-pay | Admitting: Adult Health

## 2014-02-07 NOTE — Telephone Encounter (Signed)
Spoke to patient's wife to notify her of Raquel's comments. Mrs. Pearman verbalized understanding. She stated she will give her husband the information. Left a callback number for patient if he has any questions.

## 2014-02-08 ENCOUNTER — Ambulatory Visit: Payer: Self-pay | Admitting: Podiatry

## 2014-02-08 DIAGNOSIS — I10 Essential (primary) hypertension: Secondary | ICD-10-CM | POA: Diagnosis not present

## 2014-02-08 DIAGNOSIS — M069 Rheumatoid arthritis, unspecified: Secondary | ICD-10-CM | POA: Diagnosis not present

## 2014-02-08 DIAGNOSIS — M2040 Other hammer toe(s) (acquired), unspecified foot: Secondary | ICD-10-CM | POA: Diagnosis not present

## 2014-02-08 DIAGNOSIS — M898X9 Other specified disorders of bone, unspecified site: Secondary | ICD-10-CM | POA: Diagnosis not present

## 2014-02-08 DIAGNOSIS — Z7982 Long term (current) use of aspirin: Secondary | ICD-10-CM | POA: Diagnosis not present

## 2014-02-08 DIAGNOSIS — H409 Unspecified glaucoma: Secondary | ICD-10-CM | POA: Diagnosis not present

## 2014-02-08 DIAGNOSIS — M205X9 Other deformities of toe(s) (acquired), unspecified foot: Secondary | ICD-10-CM | POA: Diagnosis not present

## 2014-02-14 ENCOUNTER — Ambulatory Visit: Payer: Self-pay | Admitting: Urology

## 2014-02-14 DIAGNOSIS — G478 Other sleep disorders: Secondary | ICD-10-CM | POA: Diagnosis not present

## 2014-02-17 DIAGNOSIS — G4733 Obstructive sleep apnea (adult) (pediatric): Secondary | ICD-10-CM | POA: Diagnosis not present

## 2014-02-28 DIAGNOSIS — E291 Testicular hypofunction: Secondary | ICD-10-CM | POA: Diagnosis not present

## 2014-03-05 ENCOUNTER — Telehealth: Payer: Self-pay

## 2014-03-05 ENCOUNTER — Other Ambulatory Visit: Payer: Self-pay | Admitting: Internal Medicine

## 2014-03-05 ENCOUNTER — Ambulatory Visit: Payer: Self-pay | Admitting: Pain Medicine

## 2014-03-05 DIAGNOSIS — M549 Dorsalgia, unspecified: Principal | ICD-10-CM

## 2014-03-05 DIAGNOSIS — M79604 Pain in right leg: Secondary | ICD-10-CM | POA: Diagnosis not present

## 2014-03-05 DIAGNOSIS — M791 Myalgia: Secondary | ICD-10-CM | POA: Diagnosis not present

## 2014-03-05 DIAGNOSIS — M79605 Pain in left leg: Secondary | ICD-10-CM | POA: Diagnosis not present

## 2014-03-05 DIAGNOSIS — M545 Low back pain: Secondary | ICD-10-CM | POA: Diagnosis not present

## 2014-03-05 DIAGNOSIS — G8929 Other chronic pain: Secondary | ICD-10-CM

## 2014-03-05 DIAGNOSIS — G90529 Complex regional pain syndrome I of unspecified lower limb: Secondary | ICD-10-CM | POA: Diagnosis not present

## 2014-03-05 MED ORDER — HYDROCODONE-ACETAMINOPHEN 5-325 MG PO TABS
1.0000 | ORAL_TABLET | Freq: Four times a day (QID) | ORAL | Status: DC | PRN
Start: 2014-03-05 — End: 2014-04-09

## 2014-03-05 MED ORDER — FENTANYL 25 MCG/HR TD PT72: 25.0000 ug | MEDICATED_PATCH | TRANSDERMAL | Status: DC

## 2014-03-05 NOTE — Telephone Encounter (Signed)
Please advise to refill 

## 2014-03-05 NOTE — Telephone Encounter (Signed)
Is the chronic back pain lumbar or thoracic?

## 2014-03-05 NOTE — Telephone Encounter (Signed)
The patient called and is hoping to get a refill on his pain medication.  He is a former pt of R.Rey.  He stated Rey instructed him to continue his care with Dr.Tullo, and he made an apt for January.  However, he is now stating he is completely out of his pain medication.  He is hoping for a refill on his Fentanyl patches and a refill on his hydrocodone.  His last office visit with Rey was 02/05/14.

## 2014-03-05 NOTE — Telephone Encounter (Signed)
That is not what Raquel's notes indicate.  She referred him to Pain clinic in July. .  I did not accept him as a patient, and I will not refill his pain meds.

## 2014-03-05 NOTE — Telephone Encounter (Addendum)
Patient stated that he has been to the pain clinic X[facility] pain clinic doctor states must have MRI before he will fill. Patient stated that he has severe lower back pain especially at night patient stated that we can verify with Dr. Primus Bravo.  Medication was filled 01/30/14 BY NP.

## 2014-03-05 NOTE — Telephone Encounter (Signed)
The patient called back.

## 2014-03-05 NOTE — Telephone Encounter (Signed)
Left message for patient to return call to office. 

## 2014-03-05 NOTE — Telephone Encounter (Signed)
Ok.  I will order the MRI of the lumbar spine and refill the medication for month.

## 2014-03-06 DIAGNOSIS — M069 Rheumatoid arthritis, unspecified: Secondary | ICD-10-CM | POA: Diagnosis not present

## 2014-03-06 DIAGNOSIS — Z79899 Other long term (current) drug therapy: Secondary | ICD-10-CM | POA: Diagnosis not present

## 2014-03-06 NOTE — Telephone Encounter (Signed)
Spoke with pt advised Rx ready for pick up

## 2014-03-06 NOTE — Telephone Encounter (Addendum)
Patient stated that DR. Hookerton office ordered the MRI just had not received date or time.

## 2014-03-08 DIAGNOSIS — H40023 Open angle with borderline findings, high risk, bilateral: Secondary | ICD-10-CM | POA: Diagnosis not present

## 2014-03-08 DIAGNOSIS — H04123 Dry eye syndrome of bilateral lacrimal glands: Secondary | ICD-10-CM | POA: Diagnosis not present

## 2014-03-15 DIAGNOSIS — E291 Testicular hypofunction: Secondary | ICD-10-CM | POA: Diagnosis not present

## 2014-03-19 ENCOUNTER — Ambulatory Visit: Payer: Self-pay

## 2014-03-19 ENCOUNTER — Ambulatory Visit: Payer: Self-pay | Admitting: Pain Medicine

## 2014-03-19 DIAGNOSIS — J449 Chronic obstructive pulmonary disease, unspecified: Secondary | ICD-10-CM | POA: Diagnosis not present

## 2014-03-19 DIAGNOSIS — Z79899 Other long term (current) drug therapy: Secondary | ICD-10-CM | POA: Diagnosis not present

## 2014-03-19 DIAGNOSIS — D61818 Other pancytopenia: Secondary | ICD-10-CM | POA: Diagnosis not present

## 2014-03-19 DIAGNOSIS — M5126 Other intervertebral disc displacement, lumbar region: Secondary | ICD-10-CM | POA: Diagnosis not present

## 2014-03-19 DIAGNOSIS — Z7982 Long term (current) use of aspirin: Secondary | ICD-10-CM | POA: Diagnosis not present

## 2014-03-19 DIAGNOSIS — Z87891 Personal history of nicotine dependence: Secondary | ICD-10-CM | POA: Diagnosis not present

## 2014-03-19 DIAGNOSIS — M069 Rheumatoid arthritis, unspecified: Secondary | ICD-10-CM | POA: Diagnosis not present

## 2014-03-19 DIAGNOSIS — G8929 Other chronic pain: Secondary | ICD-10-CM | POA: Diagnosis not present

## 2014-03-19 DIAGNOSIS — E785 Hyperlipidemia, unspecified: Secondary | ICD-10-CM | POA: Diagnosis not present

## 2014-03-19 DIAGNOSIS — E780 Pure hypercholesterolemia: Secondary | ICD-10-CM | POA: Diagnosis not present

## 2014-03-19 DIAGNOSIS — Z952 Presence of prosthetic heart valve: Secondary | ICD-10-CM | POA: Diagnosis not present

## 2014-03-19 DIAGNOSIS — I10 Essential (primary) hypertension: Secondary | ICD-10-CM | POA: Diagnosis not present

## 2014-03-19 LAB — CBC CANCER CENTER
Basophil #: 0 x10 3/mm (ref 0.0–0.1)
Basophil %: 0.9 %
Eosinophil #: 0.2 x10 3/mm (ref 0.0–0.7)
Eosinophil %: 6.1 %
HCT: 39.7 % — ABNORMAL LOW (ref 40.0–52.0)
HGB: 12.9 g/dL — ABNORMAL LOW (ref 13.0–18.0)
Lymphocyte #: 0.8 x10 3/mm — ABNORMAL LOW (ref 1.0–3.6)
Lymphocyte %: 19.2 %
MCH: 32.3 pg (ref 26.0–34.0)
MCHC: 32.6 g/dL (ref 32.0–36.0)
MCV: 99 fL (ref 80–100)
Monocyte #: 0.5 x10 3/mm (ref 0.2–1.0)
Monocyte %: 11.6 %
Neutrophil #: 2.4 x10 3/mm (ref 1.4–6.5)
Neutrophil %: 62.2 %
Platelet: 135 x10 3/mm — ABNORMAL LOW (ref 150–440)
RBC: 4 10*6/uL — ABNORMAL LOW (ref 4.40–5.90)
RDW: 14.4 % (ref 11.5–14.5)
WBC: 3.9 x10 3/mm (ref 3.8–10.6)

## 2014-03-19 LAB — COMPREHENSIVE METABOLIC PANEL
Albumin: 4 g/dL (ref 3.4–5.0)
Alkaline Phosphatase: 61 U/L
Anion Gap: 7 (ref 7–16)
BUN: 28 mg/dL — ABNORMAL HIGH (ref 7–18)
Bilirubin,Total: 0.4 mg/dL (ref 0.2–1.0)
Calcium, Total: 9 mg/dL (ref 8.5–10.1)
Chloride: 103 mmol/L (ref 98–107)
Co2: 27 mmol/L (ref 21–32)
Creatinine: 1.42 mg/dL — ABNORMAL HIGH (ref 0.60–1.30)
EGFR (African American): >60
EGFR (Non-African Amer.): 52 — ABNORMAL LOW
Glucose: 87 mg/dL (ref 65–99)
Osmolality: 279 (ref 275–301)
Potassium: 4.4 mmol/L (ref 3.5–5.1)
SGOT(AST): 23 U/L (ref 15–37)
SGPT (ALT): 19 U/L
Sodium: 137 mmol/L (ref 136–145)
Total Protein: 7.2 g/dL (ref 6.4–8.2)

## 2014-03-24 ENCOUNTER — Ambulatory Visit: Payer: Self-pay

## 2014-03-27 ENCOUNTER — Ambulatory Visit: Payer: Self-pay | Admitting: Pain Medicine

## 2014-03-27 DIAGNOSIS — M4806 Spinal stenosis, lumbar region: Secondary | ICD-10-CM | POA: Diagnosis not present

## 2014-03-27 DIAGNOSIS — M5137 Other intervertebral disc degeneration, lumbosacral region: Secondary | ICD-10-CM | POA: Diagnosis not present

## 2014-03-27 DIAGNOSIS — M5416 Radiculopathy, lumbar region: Secondary | ICD-10-CM | POA: Diagnosis not present

## 2014-03-27 DIAGNOSIS — M5126 Other intervertebral disc displacement, lumbar region: Secondary | ICD-10-CM | POA: Diagnosis not present

## 2014-03-28 ENCOUNTER — Emergency Department: Payer: Self-pay | Admitting: Emergency Medicine

## 2014-03-28 ENCOUNTER — Telehealth: Payer: Self-pay | Admitting: Adult Health

## 2014-03-28 DIAGNOSIS — Z79899 Other long term (current) drug therapy: Secondary | ICD-10-CM | POA: Diagnosis not present

## 2014-03-28 DIAGNOSIS — Z880 Allergy status to penicillin: Secondary | ICD-10-CM | POA: Diagnosis not present

## 2014-03-28 DIAGNOSIS — I10 Essential (primary) hypertension: Secondary | ICD-10-CM | POA: Diagnosis not present

## 2014-03-28 DIAGNOSIS — R410 Disorientation, unspecified: Secondary | ICD-10-CM | POA: Diagnosis not present

## 2014-03-28 LAB — URINALYSIS, COMPLETE
Bacteria: NONE SEEN
Bilirubin,UR: NEGATIVE
Blood: NEGATIVE
Glucose,UR: NEGATIVE mg/dL (ref 0–75)
Ketone: NEGATIVE
Leukocyte Esterase: NEGATIVE
Nitrite: NEGATIVE
Ph: 6 (ref 4.5–8.0)
Protein: NEGATIVE
RBC,UR: <1 /HPF (ref 0–5)
Specific Gravity: 1.016 (ref 1.003–1.030)
Squamous Epithelial: NONE SEEN
WBC UR: <1 /HPF (ref 0–5)

## 2014-03-28 LAB — TROPONIN I: Troponin-I: <0.02 ng/mL

## 2014-03-28 LAB — COMPREHENSIVE METABOLIC PANEL
Albumin: 4.1 g/dL (ref 3.4–5.0)
Alkaline Phosphatase: 56 U/L
Anion Gap: 6 — ABNORMAL LOW (ref 7–16)
BUN: 27 mg/dL — ABNORMAL HIGH (ref 7–18)
Bilirubin,Total: 0.3 mg/dL (ref 0.2–1.0)
Calcium, Total: 8.9 mg/dL (ref 8.5–10.1)
Chloride: 103 mmol/L (ref 98–107)
Co2: 27 mmol/L (ref 21–32)
Creatinine: 1.47 mg/dL — ABNORMAL HIGH (ref 0.60–1.30)
EGFR (African American): >60
EGFR (Non-African Amer.): 50 — ABNORMAL LOW
Glucose: 114 mg/dL — ABNORMAL HIGH (ref 65–99)
Osmolality: 278 (ref 275–301)
Potassium: 4.4 mmol/L (ref 3.5–5.1)
SGOT(AST): 35 U/L (ref 15–37)
SGPT (ALT): 18 U/L
Sodium: 136 mmol/L (ref 136–145)
Total Protein: 7.5 g/dL (ref 6.4–8.2)

## 2014-03-28 LAB — CBC
HCT: 40.4 % (ref 40.0–52.0)
HGB: 13.2 g/dL (ref 13.0–18.0)
MCH: 32.3 pg (ref 26.0–34.0)
MCHC: 32.7 g/dL (ref 32.0–36.0)
MCV: 99 fL (ref 80–100)
Platelet: 118 10*3/uL — ABNORMAL LOW (ref 150–440)
RBC: 4.09 10*6/uL — ABNORMAL LOW (ref 4.40–5.90)
RDW: 13.6 % (ref 11.5–14.5)
WBC: 9.8 10*3/uL (ref 3.8–10.6)

## 2014-03-28 LAB — PROTIME-INR
INR: 1
Prothrombin Time: 13.3 secs (ref 11.5–14.7)

## 2014-03-28 LAB — APTT: Activated PTT: 27.3 secs (ref 23.6–35.9)

## 2014-03-28 NOTE — Telephone Encounter (Signed)
FYI sent to ED, wife agreeable. Bryan [name] patient

## 2014-03-28 NOTE — Telephone Encounter (Signed)
Patient Information:  Caller Name: Tamela Oddi  Phone: [phone]  Patient: Bryan [name], Bryan [name]  Gender: Male  DOB: Mar 18, 1941  Age: 73 Years  PCP: Charolette Forward  Office Follow Up:  Does the office need to follow up with this patient?: Yes  Instructions For The Office: Patient with neurologic changes since yesterday 03/27/2014. weakness, confusion, Advised ED/911 understanding expressed.  RN Note:  Advised wife to call 911/ patient with confusion/weakness/memory loss . Understanding expressed  Symptoms  Reason For Call & Symptoms: Wife states that her husband appears to be have some memory loss.  Face is red, flushed, with confusion onset yesterday morning 03/27/14.  He got up this morning to go to bathroom and went to wrong bed.  Decreased appetite, asking questions he should know the answer to. No weakness noted to one of body. speech is not slurred, difficult to waken this morning and appearing confused. Laying on sofa  Reviewed Health History In EMR: Yes  Reviewed Medications In EMR: Yes  Reviewed Allergies In EMR: Yes  Reviewed Surgeries / Procedures: Yes  Date of Onset of Symptoms: 03/27/2014  Guideline(s) Used:  Neurologic Deficit  Confusion - Delirium  Disposition Per Guideline:   Call EMS 911 Now  Reason For Disposition Reached:   Difficult to awaken or acting confused (e.g., disoriented, slurred speech)  Advice Given:  Call Back If:  Symptoms do not go away within 30 minutes  You become worse.  Patient Will Follow Care Advice:  YES

## 2014-03-29 ENCOUNTER — Ambulatory Visit: Payer: Self-pay

## 2014-03-31 ENCOUNTER — Encounter: Payer: Self-pay | Admitting: Internal Medicine

## 2014-04-01 DIAGNOSIS — R4182 Altered mental status, unspecified: Secondary | ICD-10-CM | POA: Insufficient documentation

## 2014-04-01 DIAGNOSIS — R4189 Other symptoms and signs involving cognitive functions and awareness: Secondary | ICD-10-CM | POA: Diagnosis not present

## 2014-04-03 DIAGNOSIS — M25511 Pain in right shoulder: Secondary | ICD-10-CM | POA: Diagnosis not present

## 2014-04-03 DIAGNOSIS — M0589 Other rheumatoid arthritis with rheumatoid factor of multiple sites: Secondary | ICD-10-CM | POA: Diagnosis not present

## 2014-04-09 ENCOUNTER — Other Ambulatory Visit: Payer: Self-pay | Admitting: Internal Medicine

## 2014-04-09 DIAGNOSIS — Z23 Encounter for immunization: Secondary | ICD-10-CM | POA: Diagnosis not present

## 2014-04-09 MED ORDER — HYDROCODONE-ACETAMINOPHEN 5-325 MG PO TABS: 1.0000 | ORAL_TABLET | Freq: Four times a day (QID) | ORAL | Status: DC | PRN

## 2014-04-23 ENCOUNTER — Ambulatory Visit: Payer: Self-pay | Admitting: Pain Medicine

## 2014-04-23 DIAGNOSIS — M47817 Spondylosis without myelopathy or radiculopathy, lumbosacral region: Secondary | ICD-10-CM | POA: Diagnosis not present

## 2014-04-23 DIAGNOSIS — M179 Osteoarthritis of knee, unspecified: Secondary | ICD-10-CM | POA: Diagnosis not present

## 2014-04-23 DIAGNOSIS — M069 Rheumatoid arthritis, unspecified: Secondary | ICD-10-CM | POA: Diagnosis not present

## 2014-04-23 DIAGNOSIS — M461 Sacroiliitis, not elsewhere classified: Secondary | ICD-10-CM | POA: Diagnosis not present

## 2014-04-23 DIAGNOSIS — M47816 Spondylosis without myelopathy or radiculopathy, lumbar region: Secondary | ICD-10-CM | POA: Diagnosis not present

## 2014-04-23 DIAGNOSIS — M5416 Radiculopathy, lumbar region: Secondary | ICD-10-CM | POA: Diagnosis not present

## 2014-04-24 DIAGNOSIS — E291 Testicular hypofunction: Secondary | ICD-10-CM | POA: Diagnosis not present

## 2014-04-30 ENCOUNTER — Ambulatory Visit: Payer: Self-pay | Admitting: Hematology and Oncology

## 2014-04-30 DIAGNOSIS — G629 Polyneuropathy, unspecified: Secondary | ICD-10-CM | POA: Diagnosis not present

## 2014-04-30 DIAGNOSIS — M069 Rheumatoid arthritis, unspecified: Secondary | ICD-10-CM | POA: Diagnosis not present

## 2014-04-30 DIAGNOSIS — Z79899 Other long term (current) drug therapy: Secondary | ICD-10-CM | POA: Diagnosis not present

## 2014-04-30 DIAGNOSIS — D61818 Other pancytopenia: Secondary | ICD-10-CM | POA: Diagnosis not present

## 2014-04-30 DIAGNOSIS — Z87891 Personal history of nicotine dependence: Secondary | ICD-10-CM | POA: Diagnosis not present

## 2014-04-30 LAB — CBC CANCER CENTER
Basophil #: 0 x10 3/mm (ref 0.0–0.1)
Basophil %: 0.7 %
Eosinophil #: 0.1 x10 3/mm (ref 0.0–0.7)
Eosinophil %: 3.9 %
HCT: 42 % (ref 40.0–52.0)
HGB: 13.8 g/dL (ref 13.0–18.0)
Lymphocyte #: 0.8 x10 3/mm — ABNORMAL LOW (ref 1.0–3.6)
Lymphocyte %: 24 %
MCH: 31 pg (ref 26.0–34.0)
MCHC: 32.8 g/dL (ref 32.0–36.0)
MCV: 95 fL (ref 80–100)
Monocyte #: 0.4 x10 3/mm (ref 0.2–1.0)
Monocyte %: 11.9 %
Neutrophil #: 1.9 x10 3/mm (ref 1.4–6.5)
Neutrophil %: 59.5 %
Platelet: 113 x10 3/mm — ABNORMAL LOW (ref 150–440)
RBC: 4.44 10*6/uL (ref 4.40–5.90)
RDW: 13.5 % (ref 11.5–14.5)
WBC: 3.2 x10 3/mm — ABNORMAL LOW (ref 3.8–10.6)

## 2014-04-30 LAB — COMPREHENSIVE METABOLIC PANEL
Albumin: 4.1 g/dL (ref 3.4–5.0)
Alkaline Phosphatase: 60 U/L
Anion Gap: 7 (ref 7–16)
BUN: 34 mg/dL — ABNORMAL HIGH (ref 7–18)
Bilirubin,Total: 0.3 mg/dL (ref 0.2–1.0)
Calcium, Total: 9.4 mg/dL (ref 8.5–10.1)
Chloride: 103 mmol/L (ref 98–107)
Co2: 29 mmol/L (ref 21–32)
Creatinine: 1.67 mg/dL — ABNORMAL HIGH (ref 0.60–1.30)
EGFR (African American): 52 — ABNORMAL LOW
EGFR (Non-African Amer.): 43 — ABNORMAL LOW
Glucose: 104 mg/dL — ABNORMAL HIGH (ref 65–99)
Osmolality: 285 (ref 275–301)
Potassium: 4.3 mmol/L (ref 3.5–5.1)
SGOT(AST): 33 U/L (ref 15–37)
SGPT (ALT): 25 U/L
Sodium: 139 mmol/L (ref 136–145)
Total Protein: 7.3 g/dL (ref 6.4–8.2)

## 2014-04-30 LAB — FOLATE: Folic Acid: >100 ng/mL — ABNORMAL HIGH (ref 3.1–17.5)

## 2014-04-30 LAB — IRON AND TIBC
Iron Bind.Cap.(Total): 457 ug/dL — ABNORMAL HIGH (ref 250–450)
Iron Saturation: 17 %
Iron: 76 ug/dL (ref 65–175)
Unbound Iron-Bind.Cap.: 381 ug/dL

## 2014-04-30 LAB — FERRITIN: Ferritin (ARMC): 67 ng/mL (ref 8–388)

## 2014-05-01 ENCOUNTER — Ambulatory Visit: Payer: Self-pay | Admitting: Pain Medicine

## 2014-05-01 DIAGNOSIS — M1711 Unilateral primary osteoarthritis, right knee: Secondary | ICD-10-CM | POA: Diagnosis not present

## 2014-05-01 DIAGNOSIS — Z96652 Presence of left artificial knee joint: Secondary | ICD-10-CM | POA: Diagnosis not present

## 2014-05-01 DIAGNOSIS — M170 Bilateral primary osteoarthritis of knee: Secondary | ICD-10-CM | POA: Diagnosis not present

## 2014-05-03 LAB — PROT IMMUNOELECTROPHORES(ARMC)

## 2014-05-08 ENCOUNTER — Ambulatory Visit: Payer: Self-pay | Admitting: Pain Medicine

## 2014-05-08 DIAGNOSIS — M47817 Spondylosis without myelopathy or radiculopathy, lumbosacral region: Secondary | ICD-10-CM | POA: Diagnosis not present

## 2014-05-08 DIAGNOSIS — M545 Low back pain: Secondary | ICD-10-CM | POA: Diagnosis not present

## 2014-05-08 DIAGNOSIS — M5416 Radiculopathy, lumbar region: Secondary | ICD-10-CM | POA: Diagnosis not present

## 2014-05-08 DIAGNOSIS — M47896 Other spondylosis, lumbar region: Secondary | ICD-10-CM | POA: Diagnosis not present

## 2014-05-08 DIAGNOSIS — M069 Rheumatoid arthritis, unspecified: Secondary | ICD-10-CM | POA: Diagnosis not present

## 2014-05-08 DIAGNOSIS — M5116 Intervertebral disc disorders with radiculopathy, lumbar region: Secondary | ICD-10-CM | POA: Diagnosis not present

## 2014-05-08 DIAGNOSIS — M170 Bilateral primary osteoarthritis of knee: Secondary | ICD-10-CM | POA: Diagnosis not present

## 2014-05-08 DIAGNOSIS — M546 Pain in thoracic spine: Secondary | ICD-10-CM | POA: Diagnosis not present

## 2014-05-08 DIAGNOSIS — M542 Cervicalgia: Secondary | ICD-10-CM | POA: Diagnosis not present

## 2014-05-08 DIAGNOSIS — M4806 Spinal stenosis, lumbar region: Secondary | ICD-10-CM | POA: Diagnosis not present

## 2014-05-22 ENCOUNTER — Ambulatory Visit: Payer: Self-pay | Admitting: Pain Medicine

## 2014-05-22 DIAGNOSIS — M170 Bilateral primary osteoarthritis of knee: Secondary | ICD-10-CM | POA: Diagnosis not present

## 2014-05-22 DIAGNOSIS — M069 Rheumatoid arthritis, unspecified: Secondary | ICD-10-CM | POA: Diagnosis not present

## 2014-05-22 DIAGNOSIS — M5126 Other intervertebral disc displacement, lumbar region: Secondary | ICD-10-CM | POA: Diagnosis not present

## 2014-05-22 DIAGNOSIS — M5416 Radiculopathy, lumbar region: Secondary | ICD-10-CM | POA: Diagnosis not present

## 2014-05-22 DIAGNOSIS — M47896 Other spondylosis, lumbar region: Secondary | ICD-10-CM | POA: Diagnosis not present

## 2014-05-22 DIAGNOSIS — M47817 Spondylosis without myelopathy or radiculopathy, lumbosacral region: Secondary | ICD-10-CM | POA: Diagnosis not present

## 2014-05-22 DIAGNOSIS — M1990 Unspecified osteoarthritis, unspecified site: Secondary | ICD-10-CM | POA: Diagnosis not present

## 2014-05-24 ENCOUNTER — Ambulatory Visit: Payer: Self-pay | Admitting: Hematology and Oncology

## 2014-05-27 ENCOUNTER — Ambulatory Visit (INDEPENDENT_AMBULATORY_CARE_PROVIDER_SITE_OTHER): Payer: Medicare Other | Admitting: Cardiovascular Disease

## 2014-05-27 ENCOUNTER — Encounter: Payer: Self-pay | Admitting: Cardiovascular Disease

## 2014-05-27 VITALS — BP 130/78 | HR 59 | Ht 73.0 in | Wt 199.5 lb

## 2014-05-27 DIAGNOSIS — E785 Hyperlipidemia, unspecified: Secondary | ICD-10-CM

## 2014-05-27 DIAGNOSIS — I7121 Aneurysm of the ascending aorta, without rupture: Secondary | ICD-10-CM | POA: Insufficient documentation

## 2014-05-27 DIAGNOSIS — I712 Thoracic aortic aneurysm, without rupture, unspecified: Secondary | ICD-10-CM | POA: Insufficient documentation

## 2014-05-27 DIAGNOSIS — Z9889 Other specified postprocedural states: Secondary | ICD-10-CM

## 2014-05-27 DIAGNOSIS — I2510 Atherosclerotic heart disease of native coronary artery without angina pectoris: Secondary | ICD-10-CM

## 2014-05-27 DIAGNOSIS — I10 Essential (primary) hypertension: Secondary | ICD-10-CM | POA: Diagnosis not present

## 2014-05-27 DIAGNOSIS — Z952 Presence of prosthetic heart valve: Secondary | ICD-10-CM

## 2014-05-27 DIAGNOSIS — Z954 Presence of other heart-valve replacement: Secondary | ICD-10-CM | POA: Diagnosis not present

## 2014-05-27 MED ORDER — AMOXICILLIN 500 MG PO CAPS: 2000.0000 mg | ORAL_CAPSULE | Freq: Once | ORAL | Status: DC

## 2014-05-27 NOTE — Patient Instructions (Addendum)
You are doing well. No medication changes were made.  We will order an echocardiogram for prosthetic aortic valve  Please call us if you have new issues that need to be addressed before your next appt.  Your physician wants you to follow-up in: 6 months after echocardiogram  You will receive a reminder letter in the mail two months in advance. If you don't receive a letter, please call our office to schedule the follow-up appointment.

## 2014-05-27 NOTE — Assessment & Plan Note (Signed)
Blood pressure is well controlled on today's visit. No changes made to the medications. 

## 2014-05-27 NOTE — Assessment & Plan Note (Signed)
We will schedule repeat echocardiogram July 2016.

## 2014-05-27 NOTE — Assessment & Plan Note (Signed)
Currently with no symptoms of angina. No further workup at this time. Continue current medication regimen. 

## 2014-05-27 NOTE — Progress Notes (Signed)
Patient ID: Bryan [name], male    DOB: 1940-05-28, 74 y.o.   MRN: [id number]  HPI Comments: Bryan [name] is a pleasant 74 year old gentleman with a history of Ascending aorta aneurysm, bicuspid aortic valve noted in 2009 with cardiac catheterization at that time showing mild to moderate CAD, who underwent aVR with bioprosthetic valve, and ascending aorta grafting in 2010, history of hypertension, hyperlipidemia, mild chronic renal insufficiency, total knee replacement in 2004, appendix rupture and gallbladder surgery in 2010, who presents for routine follow-up of his bioprosthetic aortic valve. Remote history of postoperative atrial fibrillation and 2010 He is a retired Software engineer  In follow-up today, he reports that he is feeling well. Denies any shortness of breath, leg swelling. Overall no changes. Echocardiogram July 2015 showing mild to moderate aortic valve stenosis, 4.5 cm ascending aorta. No prior records available  Notes indicate some degree of carotid disease though details are not available. EKG on today's visit shows normal sinus rhythm with rate 59 bpm, no significant ST or T-wave changes  He has been monitoring his blood pressure periodically at home. Typically systolic pressure in the 737-106 range. Lab work this year showing hemoglobin A1c 6.0, BUN 38, creatinine 1.54,  Most recent total cholesterol in the 150 range   Allergies  Allergen Reactions  . Demerol [Meperidine]     hives  . Penicillins     Hives     Outpatient Encounter Prescriptions as of 05/27/2014  Medication Sig  . amoxicillin (AMOXIL) 500 MG capsule Take 4 caps 1 hour prior to dental appointment.  Marland Kitchen aspirin 325 MG tablet Take 325 mg by mouth daily.  . brimonidine (ALPHAGAN) 0.2 % ophthalmic solution Place 1 drop into both eyes daily.  . cetirizine (ZYRTEC) 10 MG tablet Take 10 mg by mouth daily.  . Cholecalciferol (VITAMIN D) 400 UNITS capsule Take 400 Units by mouth daily.  Marland Kitchen docusate calcium  (SURFAK) 240 MG capsule Take 240 mg by mouth daily.  . fenofibrate 160 MG tablet Take 1 tablet (160 mg total) by mouth daily.  . fentaNYL (DURAGESIC - DOSED MCG/HR) 25 MCG/HR patch Place 1 patch (25 mcg total) onto the skin every 3 (three) days.  Marland Kitchen FLUoxetine (PROZAC) 20 MG capsule Take 20 mg by mouth daily.   . folic acid (FOLVITE) 1 MG tablet Take 1 mg by mouth 2 (two) times daily.   Marland Kitchen gabapentin (NEURONTIN) 800 MG tablet Take 1 tablet (800 mg total) by mouth 3 (three) times daily as needed.  Marland Kitchen HYDROcodone-acetaminophen (NORCO/VICODIN) 5-325 MG per tablet Take 1 tablet by mouth every 6 (six) hours as needed for moderate pain.  . hydroxychloroquine (PLAQUENIL) 200 MG tablet Take 200 mg by mouth daily.  Marland Kitchen lisinopril (PRINIVIL,ZESTRIL) 20 MG tablet Take 1 tablet (20 mg total) by mouth daily. (Patient taking differently: Take 20 mg by mouth daily at 10 pm. )  . lisinopril-hydrochlorothiazide (PRINZIDE,ZESTORETIC) 20-25 MG per tablet Take 1 tablet by mouth daily.  . Magnesium 400 MG TABS Take 400 mg by mouth daily.  . predniSONE (DELTASONE) 5 MG tablet Take 5 mg by mouth as needed.   . simvastatin (ZOCOR) 40 MG tablet Take 1 tablet (40 mg total) by mouth daily.  . tamsulosin (FLOMAX) 0.4 MG CAPS capsule Take 1 capsule (0.4 mg total) by mouth daily.  . vitamin B-12 (CYANOCOBALAMIN) 1000 MCG tablet Take 1,000 mcg by mouth daily.  . [DISCONTINUED] methotrexate (RHEUMATREX) 2.5 MG tablet Take 7.5 mg by mouth once a week.    Past  Medical History  Diagnosis Date  . Coronary artery disease   . Hyperlipidemia   . H/O thoracic aortic aneurysm repair   . Valvular heart disease   . Paroxysmal a-fib   . Hypertension   . Arrhythmia   . Glaucoma   . Arthritis   . Depression   . Allergy   . History of blood transfusion   . History of chicken pox   . Chronic back pain     Past Surgical History  Procedure Laterality Date  . Thoracic aortic aneurysm repair  2010  . Aortic valve replacement    .  Cholecystectomy  2010  . Appendectomy  2010    Social History  reports that he quit smoking about 32 years ago. His smoking use included Cigarettes. He has a 32 pack-year smoking history. He does not have any smokeless tobacco history on file. He reports that he drinks about 0.6 oz of alcohol per week. He reports that he does not use illicit drugs.  Family History family history includes Asthma in his mother; Heart attack (age of onset: 33) in his father; Heart failure in his mother; Hypertension in his mother and sister.   Review of Systems  Constitutional: Negative.   Respiratory: Negative.   Cardiovascular: Negative.   Gastrointestinal: Negative.   Musculoskeletal: Negative.   Neurological: Negative.   Hematological: Negative.   Psychiatric/Behavioral: Negative.   All other systems reviewed and are negative.   BP 130/78 mmHg  Pulse 59  Ht 6\' 1"  (1.854 m)  Wt 199 lb 8 oz (90.493 kg)  BMI 26.33 kg/m2  Physical Exam  Constitutional: He is oriented to person, place, and time. He appears well-developed and well-nourished.  HENT:  Head: Normocephalic.  Nose: Nose normal.  Mouth/Throat: Oropharynx is clear and moist.  Eyes: Conjunctivae are normal. Pupils are equal, round, and reactive to light.  Neck: Normal range of motion. Neck supple. No JVD present.  Cardiovascular: Normal rate, regular rhythm, S1 normal, S2 normal, normal heart sounds and intact distal pulses.  Exam reveals no gallop and no friction rub.   No murmur heard. Pulmonary/Chest: Effort normal and breath sounds normal. No respiratory distress. He has no wheezes. He has no rales. He exhibits no tenderness.  Abdominal: Soft. Bowel sounds are normal. He exhibits no distension. There is no tenderness.  Musculoskeletal: Normal range of motion. He exhibits no edema or tenderness.  Lymphadenopathy:    He has no cervical adenopathy.  Neurological: He is alert and oriented to person, place, and time. Coordination  normal.  Skin: Skin is warm and dry. No rash noted. No erythema.  Psychiatric: He has a normal mood and affect. His behavior is normal. Judgment and thought content normal.      Assessment and Plan   Nursing note and vitals reviewed.

## 2014-05-27 NOTE — Assessment & Plan Note (Signed)
Repair in 2010. Surgical notes have been requested on today's visit from 2010

## 2014-05-27 NOTE — Assessment & Plan Note (Signed)
Cholesterol slightly above goal. Recommended strict dieting, continue cholesterol medication

## 2014-05-27 NOTE — Assessment & Plan Note (Signed)
4.5 cm aneurysm on echocardiogram July 2015. Repeat echocardiogram ordered July 2016

## 2014-06-03 ENCOUNTER — Ambulatory Visit: Payer: Medicare Other | Admitting: Internal Medicine

## 2014-06-06 ENCOUNTER — Ambulatory Visit: Payer: Self-pay | Admitting: Pain Medicine

## 2014-06-06 DIAGNOSIS — M4806 Spinal stenosis, lumbar region: Secondary | ICD-10-CM | POA: Diagnosis not present

## 2014-06-06 DIAGNOSIS — M5416 Radiculopathy, lumbar region: Secondary | ICD-10-CM | POA: Diagnosis not present

## 2014-06-06 DIAGNOSIS — M170 Bilateral primary osteoarthritis of knee: Secondary | ICD-10-CM | POA: Diagnosis not present

## 2014-06-06 DIAGNOSIS — M5126 Other intervertebral disc displacement, lumbar region: Secondary | ICD-10-CM | POA: Diagnosis not present

## 2014-06-06 DIAGNOSIS — M47896 Other spondylosis, lumbar region: Secondary | ICD-10-CM | POA: Diagnosis not present

## 2014-06-06 DIAGNOSIS — M069 Rheumatoid arthritis, unspecified: Secondary | ICD-10-CM | POA: Diagnosis not present

## 2014-06-06 DIAGNOSIS — M47817 Spondylosis without myelopathy or radiculopathy, lumbosacral region: Secondary | ICD-10-CM | POA: Diagnosis not present

## 2014-06-12 DIAGNOSIS — M898X9 Other specified disorders of bone, unspecified site: Secondary | ICD-10-CM | POA: Diagnosis not present

## 2014-06-12 DIAGNOSIS — M1460 Charcot's joint, unspecified site: Secondary | ICD-10-CM | POA: Diagnosis not present

## 2014-06-12 DIAGNOSIS — M069 Rheumatoid arthritis, unspecified: Secondary | ICD-10-CM | POA: Diagnosis not present

## 2014-06-12 NOTE — Telephone Encounter (Signed)
This encounter was created in error - please disregard.

## 2014-06-15 ENCOUNTER — Encounter: Payer: Self-pay | Admitting: *Deleted

## 2014-06-18 ENCOUNTER — Encounter: Payer: Self-pay | Admitting: Nurse Practitioner

## 2014-06-18 ENCOUNTER — Ambulatory Visit (INDEPENDENT_AMBULATORY_CARE_PROVIDER_SITE_OTHER): Payer: Medicare Other | Admitting: Nurse Practitioner

## 2014-06-18 VITALS — BP 102/60 | HR 71 | Temp 97.6°F | Resp 12 | Ht 72.0 in | Wt 198.1 lb

## 2014-06-18 DIAGNOSIS — R404 Transient alteration of awareness: Secondary | ICD-10-CM | POA: Diagnosis not present

## 2014-06-18 DIAGNOSIS — D6489 Other specified anemias: Secondary | ICD-10-CM | POA: Diagnosis not present

## 2014-06-18 DIAGNOSIS — I480 Paroxysmal atrial fibrillation: Secondary | ICD-10-CM

## 2014-06-18 DIAGNOSIS — Z79899 Other long term (current) drug therapy: Secondary | ICD-10-CM

## 2014-06-18 DIAGNOSIS — I2510 Atherosclerotic heart disease of native coronary artery without angina pectoris: Secondary | ICD-10-CM | POA: Diagnosis not present

## 2014-06-18 NOTE — Progress Notes (Signed)
Pre visit review using our clinic review tool, if applicable. No additional management support is needed unless otherwise documented below in the visit note. 

## 2014-06-18 NOTE — Progress Notes (Signed)
Subjective:    Patient ID: Bryan [name], male    DOB: 24-May-1941, 74 y.o.   MRN: [id number]  HPI  Bryan [name] is a 74 yo male with a CC of memory loss x 3 months. He is accompanied by his wife who is also aiding in the history.   1) Pt's wife states Bryan [name] is not remembering where his clothes are located, location of stuff in cabinets, and she reports he is more forgetful after being "sharp" prior. Pt believes he is more forgetful, but wife is the only one providing examples.   Pt has previously been seen by Dr. [name] at [place] in the hospital with a "spell of altered cognition". I have personally reviewed this note from 03/28/14.  CT of the head without contrast was performed with no acute findings and normal brain for age. I have personally reviewed this report.   Sees Rheumatology- Precious Reel   Review of Systems  Constitutional: Negative for fever, chills, diaphoresis, fatigue and unexpected weight change.  Eyes: Negative for visual disturbance.  Respiratory: Negative for chest tightness, shortness of breath and wheezing.   Cardiovascular: Negative for chest pain, palpitations and leg swelling.  Gastrointestinal: Negative for nausea, vomiting and diarrhea.  Musculoskeletal: Positive for back pain. Negative for myalgias, neck pain and neck stiffness.  Skin: Negative for rash.  Neurological: Negative for dizziness, weakness, numbness and headaches.  Psychiatric/Behavioral: Positive for confusion. Negative for behavioral problems, sleep disturbance, self-injury and agitation. The patient is not nervous/anxious.    Past Medical History  Diagnosis Date  . Coronary artery disease   . Hyperlipidemia   . H/O thoracic aortic aneurysm repair   . Valvular heart disease   . Paroxysmal a-fib   . Hypertension   . Arrhythmia   . Glaucoma   . Arthritis   . Depression   . Allergy   . History of blood transfusion   . History of chicken pox   . Chronic back pain      History   Social History  . Marital Status: Married    Spouse Name: N/A    Number of Children: N/A  . Years of Education: N/A   Occupational History  . Not on file.   Social History Main Topics  . Smoking status: Former Smoker -- 1.00 packs/day for 32 years    Types: Cigarettes    Quit date: 07/05/1981  . Smokeless tobacco: Not on file  . Alcohol Use: 0.6 oz/week    1 Glasses of wine per week  . Drug Use: No  . Sexual Activity: Not on file   Other Topics Concern  . Not on file   Social History Narrative    Past Surgical History  Procedure Laterality Date  . Thoracic aortic aneurysm repair  2010  . Aortic valve replacement    . Cholecystectomy  2010  . Appendectomy  2010    Family History  Problem Relation Age of Onset  . Heart failure Mother   . Hypertension Mother   . Asthma Mother   . Heart attack Father 58    MI  . Hypertension Sister     Allergies  Allergen Reactions  . Demerol [Meperidine]     hives  . Penicillins     Hives     Current Outpatient Prescriptions on File Prior to Visit  Medication Sig Dispense Refill  . aspirin 325 MG tablet Take 325 mg by mouth daily.    . brimonidine (ALPHAGAN) 0.2 % ophthalmic  solution Place 1 drop into both eyes daily.    . cetirizine (ZYRTEC) 10 MG tablet Take 10 mg by mouth daily.    . Cholecalciferol (VITAMIN D) 400 UNITS capsule Take 400 Units by mouth daily.    Marland Kitchen docusate calcium (SURFAK) 240 MG capsule Take 240 mg by mouth daily.    . fenofibrate 160 MG tablet Take 1 tablet (160 mg total) by mouth daily. 90 tablet 3  . fentaNYL (DURAGESIC - DOSED MCG/HR) 25 MCG/HR patch Place 1 patch (25 mcg total) onto the skin every 3 (three) days. 10 patch 0  . FLUoxetine (PROZAC) 20 MG capsule Take 20 mg by mouth daily.     . folic acid (FOLVITE) 1 MG tablet Take 1 mg by mouth 2 (two) times daily.     Marland Kitchen gabapentin (NEURONTIN) 800 MG tablet Take 1 tablet (800 mg total) by mouth 3 (three) times daily as needed. 270  tablet 3  . HYDROcodone-acetaminophen (NORCO/VICODIN) 5-325 MG per tablet Take 1 tablet by mouth every 6 (six) hours as needed for moderate pain. 60 tablet 0  . hydroxychloroquine (PLAQUENIL) 200 MG tablet Take 200 mg by mouth daily.    Marland Kitchen lisinopril (PRINIVIL,ZESTRIL) 20 MG tablet Take 1 tablet (20 mg total) by mouth daily. (Patient taking differently: Take 20 mg by mouth daily at 10 pm. ) 90 tablet 3  . lisinopril-hydrochlorothiazide (PRINZIDE,ZESTORETIC) 20-25 MG per tablet Take 1 tablet by mouth daily. 90 tablet 3  . simvastatin (ZOCOR) 40 MG tablet Take 1 tablet (40 mg total) by mouth daily. 90 tablet 3  . tamsulosin (FLOMAX) 0.4 MG CAPS capsule Take 1 capsule (0.4 mg total) by mouth daily. 90 capsule 3  . vitamin B-12 (CYANOCOBALAMIN) 1000 MCG tablet Take 1,000 mcg by mouth daily.    Marland Kitchen amoxicillin (AMOXIL) 500 MG capsule Take 4 capsules (2,000 mg total) by mouth once. Take 4 caps 1 hour prior to dental appointment. (Patient not taking: Reported on 06/18/2014) 4 capsule 2  . predniSONE (DELTASONE) 5 MG tablet Take 5 mg by mouth as needed.      No current facility-administered medications on file prior to visit.      Objective:   Physical Exam  Constitutional: He is oriented to person, place, and time. He appears well-developed and well-nourished. No distress.  BP 102/60 mmHg  Pulse 71  Temp(Src) 97.6 F (36.4 C) (Oral)  Resp 12  Ht 6' (1.829 m)  Wt 198 lb 1.9 oz (89.867 kg)  BMI 26.86 kg/m2  SpO2 94%   HENT:  Head: Normocephalic and atraumatic.  Right Ear: External ear normal.  Left Ear: External ear normal.  Cardiovascular: Normal rate, regular rhythm, normal heart sounds and intact distal pulses.  Exam reveals no gallop and no friction rub.   No murmur heard. Pulmonary/Chest: Effort normal and breath sounds normal. No respiratory distress. He has no wheezes. He has no rales. He exhibits no tenderness.  Neurological: He is alert and oriented to person, place, and time.  Skin:  Skin is warm and dry. No rash noted. He is not diaphoretic.  Psychiatric: He has a normal mood and affect. His behavior is normal. Judgment and thought content normal.  Asked pt president of [place], name 5 animals, read the face of a watch, what season are we in, where are we currently, and what floor are we on. He answered all correctly. No confusion noted at this visit.       Assessment & Plan:

## 2014-06-18 NOTE — Patient Instructions (Signed)
We will contact you with information about the referral to Neurology.

## 2014-06-19 DIAGNOSIS — R404 Transient alteration of awareness: Secondary | ICD-10-CM | POA: Insufficient documentation

## 2014-06-19 DIAGNOSIS — D649 Anemia, unspecified: Secondary | ICD-10-CM | POA: Insufficient documentation

## 2014-06-19 DIAGNOSIS — D509 Iron deficiency anemia, unspecified: Secondary | ICD-10-CM | POA: Insufficient documentation

## 2014-06-19 LAB — FOLATE: Folate: >24.6 ng/mL (ref >5.9)

## 2014-06-19 LAB — FERRITIN: Ferritin: 131.7 ng/mL (ref 22.0–322.0)

## 2014-06-19 LAB — IRON: Iron: 88 ug/dL (ref 42–165)

## 2014-06-19 LAB — VITAMIN B12: Vitamin B-12: 883 pg/mL (ref 211–911)

## 2014-06-19 NOTE — Assessment & Plan Note (Signed)
Pt has had hx of pancytopenia. Pt was supposed to return for labs. Will obtain labs that were previously ordered at today's visit. Vitamin B12, iron, folate, and ferritin. Pt to continue folic acid supplementation. FU prn.

## 2014-06-19 NOTE — Assessment & Plan Note (Signed)
Stable. Pt to be referred back to see Dr. [name] at [facility]. Will draw B12, iron, folate, and ferritin as previously ordered by another provider. FU prn.

## 2014-06-20 DIAGNOSIS — R413 Other amnesia: Secondary | ICD-10-CM | POA: Diagnosis not present

## 2014-06-20 DIAGNOSIS — R6889 Other general symptoms and signs: Secondary | ICD-10-CM | POA: Insufficient documentation

## 2014-06-20 DIAGNOSIS — R4182 Altered mental status, unspecified: Secondary | ICD-10-CM | POA: Diagnosis not present

## 2014-06-24 ENCOUNTER — Telehealth: Payer: Self-pay | Admitting: Nurse Practitioner

## 2014-06-24 DIAGNOSIS — M898X9 Other specified disorders of bone, unspecified site: Secondary | ICD-10-CM | POA: Diagnosis not present

## 2014-06-24 DIAGNOSIS — M069 Rheumatoid arthritis, unspecified: Secondary | ICD-10-CM | POA: Diagnosis not present

## 2014-06-24 DIAGNOSIS — Z7689 Persons encountering health services in other specified circumstances: Secondary | ICD-10-CM

## 2014-06-24 DIAGNOSIS — M1460 Charcot's joint, unspecified site: Secondary | ICD-10-CM | POA: Diagnosis not present

## 2014-06-24 NOTE — Telephone Encounter (Signed)
The patient left paper work to be filled out by his primary . Paper work in [employer].P's box. Needing it finished ASAP.

## 2014-06-24 NOTE — Telephone Encounter (Signed)
Noted! Thank you

## 2014-06-24 NOTE — Telephone Encounter (Signed)
Will complete within 24-48 hours as paperwork and chart review will take time outside of seeing patients. Will contact when ready.

## 2014-06-25 ENCOUNTER — Telehealth: Payer: Self-pay

## 2014-06-25 NOTE — Telephone Encounter (Signed)
Patient notified to pick up paperwork °

## 2014-06-25 NOTE — Telephone Encounter (Signed)
Pt needs cardiac clearance for foot surgery by Dr. Lehman Prom at [facility].

## 2014-06-26 NOTE — Telephone Encounter (Signed)
Dr troxler's office called stating they need a letter from [place] stating pt is okay to do foot surgery tomorrow morning,  Please fax a letter of clearance to [phone]   Any questions please call [facility]  At [phone]

## 2014-06-26 NOTE — Telephone Encounter (Signed)
1) they will need to give [place] more time to get this accomplished in the future. 2) last ov indicated no symptoms of angina 3) patient is cleared for non cardiac surgery at a moderate risk

## 2014-06-27 ENCOUNTER — Ambulatory Visit: Payer: Self-pay | Admitting: Podiatry

## 2014-06-27 DIAGNOSIS — Z880 Allergy status to penicillin: Secondary | ICD-10-CM | POA: Diagnosis not present

## 2014-06-27 DIAGNOSIS — Z888 Allergy status to other drugs, medicaments and biological substances status: Secondary | ICD-10-CM | POA: Diagnosis not present

## 2014-06-27 DIAGNOSIS — M25774 Osteophyte, right foot: Secondary | ICD-10-CM | POA: Diagnosis not present

## 2014-06-27 DIAGNOSIS — M899 Disorder of bone, unspecified: Secondary | ICD-10-CM | POA: Diagnosis not present

## 2014-06-27 DIAGNOSIS — D1631 Benign neoplasm of short bones of right lower limb: Secondary | ICD-10-CM | POA: Diagnosis not present

## 2014-06-27 DIAGNOSIS — M1460 Charcot's joint, unspecified site: Secondary | ICD-10-CM | POA: Diagnosis not present

## 2014-06-27 DIAGNOSIS — M14671 Charcot's joint, right ankle and foot: Secondary | ICD-10-CM | POA: Diagnosis not present

## 2014-06-27 DIAGNOSIS — M069 Rheumatoid arthritis, unspecified: Secondary | ICD-10-CM | POA: Diagnosis not present

## 2014-06-27 DIAGNOSIS — M898X9 Other specified disorders of bone, unspecified site: Secondary | ICD-10-CM | POA: Diagnosis not present

## 2014-06-27 NOTE — Telephone Encounter (Signed)
Clearance faxed at 8:20am.

## 2014-07-02 DIAGNOSIS — M069 Rheumatoid arthritis, unspecified: Secondary | ICD-10-CM | POA: Diagnosis not present

## 2014-07-02 DIAGNOSIS — M898X9 Other specified disorders of bone, unspecified site: Secondary | ICD-10-CM | POA: Diagnosis not present

## 2014-07-02 DIAGNOSIS — M1460 Charcot's joint, unspecified site: Secondary | ICD-10-CM | POA: Diagnosis not present

## 2014-07-02 DIAGNOSIS — Z9889 Other specified postprocedural states: Secondary | ICD-10-CM | POA: Diagnosis not present

## 2014-07-03 ENCOUNTER — Ambulatory Visit: Payer: Self-pay | Admitting: Pain Medicine

## 2014-07-03 DIAGNOSIS — M79671 Pain in right foot: Secondary | ICD-10-CM | POA: Diagnosis not present

## 2014-07-03 DIAGNOSIS — M170 Bilateral primary osteoarthritis of knee: Secondary | ICD-10-CM | POA: Diagnosis not present

## 2014-07-03 DIAGNOSIS — M069 Rheumatoid arthritis, unspecified: Secondary | ICD-10-CM | POA: Diagnosis not present

## 2014-07-03 DIAGNOSIS — M549 Dorsalgia, unspecified: Secondary | ICD-10-CM | POA: Diagnosis not present

## 2014-07-03 DIAGNOSIS — I10 Essential (primary) hypertension: Secondary | ICD-10-CM | POA: Diagnosis not present

## 2014-07-03 DIAGNOSIS — E780 Pure hypercholesterolemia: Secondary | ICD-10-CM | POA: Diagnosis not present

## 2014-07-03 DIAGNOSIS — M8929 Other disorders of bone development and growth, multiple sites: Secondary | ICD-10-CM | POA: Diagnosis not present

## 2014-07-03 DIAGNOSIS — M47817 Spondylosis without myelopathy or radiculopathy, lumbosacral region: Secondary | ICD-10-CM | POA: Diagnosis not present

## 2014-07-03 DIAGNOSIS — M5416 Radiculopathy, lumbar region: Secondary | ICD-10-CM | POA: Diagnosis not present

## 2014-07-05 ENCOUNTER — Ambulatory Visit: Payer: Medicare Other | Admitting: Internal Medicine

## 2014-07-05 ENCOUNTER — Ambulatory Visit: Payer: Medicare Other | Admitting: Nurse Practitioner

## 2014-07-10 DIAGNOSIS — M898X9 Other specified disorders of bone, unspecified site: Secondary | ICD-10-CM | POA: Diagnosis not present

## 2014-07-10 DIAGNOSIS — Z9889 Other specified postprocedural states: Secondary | ICD-10-CM | POA: Diagnosis not present

## 2014-07-17 ENCOUNTER — Other Ambulatory Visit: Payer: Self-pay | Admitting: Nurse Practitioner

## 2014-07-19 DIAGNOSIS — R4182 Altered mental status, unspecified: Secondary | ICD-10-CM | POA: Diagnosis not present

## 2014-07-24 DIAGNOSIS — R413 Other amnesia: Secondary | ICD-10-CM | POA: Diagnosis not present

## 2014-07-24 DIAGNOSIS — R4182 Altered mental status, unspecified: Secondary | ICD-10-CM | POA: Diagnosis not present

## 2014-07-24 DIAGNOSIS — M069 Rheumatoid arthritis, unspecified: Secondary | ICD-10-CM | POA: Diagnosis not present

## 2014-07-24 DIAGNOSIS — Z79899 Other long term (current) drug therapy: Secondary | ICD-10-CM | POA: Diagnosis not present

## 2014-07-26 ENCOUNTER — Ambulatory Visit: Payer: Self-pay | Admitting: Neurology

## 2014-07-26 DIAGNOSIS — I679 Cerebrovascular disease, unspecified: Secondary | ICD-10-CM | POA: Diagnosis not present

## 2014-07-26 DIAGNOSIS — R413 Other amnesia: Secondary | ICD-10-CM | POA: Diagnosis not present

## 2014-07-26 DIAGNOSIS — R4182 Altered mental status, unspecified: Secondary | ICD-10-CM | POA: Diagnosis not present

## 2014-07-31 DIAGNOSIS — D709 Neutropenia, unspecified: Secondary | ICD-10-CM | POA: Diagnosis not present

## 2014-07-31 DIAGNOSIS — M069 Rheumatoid arthritis, unspecified: Secondary | ICD-10-CM | POA: Diagnosis not present

## 2014-07-31 DIAGNOSIS — N189 Chronic kidney disease, unspecified: Secondary | ICD-10-CM | POA: Diagnosis not present

## 2014-07-31 DIAGNOSIS — Z79899 Other long term (current) drug therapy: Secondary | ICD-10-CM | POA: Diagnosis not present

## 2014-08-01 ENCOUNTER — Ambulatory Visit: Payer: Self-pay | Admitting: Pain Medicine

## 2014-08-01 DIAGNOSIS — M47817 Spondylosis without myelopathy or radiculopathy, lumbosacral region: Secondary | ICD-10-CM | POA: Diagnosis not present

## 2014-08-01 DIAGNOSIS — M5416 Radiculopathy, lumbar region: Secondary | ICD-10-CM | POA: Diagnosis not present

## 2014-08-01 DIAGNOSIS — E780 Pure hypercholesterolemia: Secondary | ICD-10-CM | POA: Diagnosis not present

## 2014-08-01 DIAGNOSIS — M546 Pain in thoracic spine: Secondary | ICD-10-CM | POA: Diagnosis not present

## 2014-08-01 DIAGNOSIS — M25571 Pain in right ankle and joints of right foot: Secondary | ICD-10-CM | POA: Diagnosis not present

## 2014-08-01 DIAGNOSIS — I10 Essential (primary) hypertension: Secondary | ICD-10-CM | POA: Diagnosis not present

## 2014-08-01 DIAGNOSIS — M069 Rheumatoid arthritis, unspecified: Secondary | ICD-10-CM | POA: Diagnosis not present

## 2014-08-01 DIAGNOSIS — M79671 Pain in right foot: Secondary | ICD-10-CM | POA: Diagnosis not present

## 2014-08-01 DIAGNOSIS — M170 Bilateral primary osteoarthritis of knee: Secondary | ICD-10-CM | POA: Diagnosis not present

## 2014-08-01 DIAGNOSIS — G8929 Other chronic pain: Secondary | ICD-10-CM | POA: Diagnosis not present

## 2014-08-07 DIAGNOSIS — N401 Enlarged prostate with lower urinary tract symptoms: Secondary | ICD-10-CM | POA: Diagnosis not present

## 2014-08-07 DIAGNOSIS — E291 Testicular hypofunction: Secondary | ICD-10-CM | POA: Diagnosis not present

## 2014-08-15 DIAGNOSIS — R6889 Other general symptoms and signs: Secondary | ICD-10-CM | POA: Diagnosis not present

## 2014-08-15 DIAGNOSIS — R413 Other amnesia: Secondary | ICD-10-CM | POA: Diagnosis not present

## 2014-08-20 ENCOUNTER — Other Ambulatory Visit: Payer: Self-pay | Admitting: Cardiovascular Disease

## 2014-08-27 ENCOUNTER — Other Ambulatory Visit: Payer: Self-pay | Admitting: Cardiovascular Disease

## 2014-08-30 ENCOUNTER — Ambulatory Visit: Payer: Medicare Other | Admitting: Nurse Practitioner

## 2014-08-30 ENCOUNTER — Other Ambulatory Visit: Payer: Self-pay | Admitting: *Deleted

## 2014-08-30 MED ORDER — FLUOXETINE HCL 20 MG PO CAPS: 20.0000 mg | ORAL_CAPSULE | Freq: Every day | ORAL | Status: DC

## 2014-09-03 ENCOUNTER — Ambulatory Visit
Admit: 2014-09-03 | Disposition: A | Discharge status: Home / Self Care | Payer: Self-pay | Attending: Pain Medicine | Admitting: Pain Medicine

## 2014-09-03 DIAGNOSIS — M170 Bilateral primary osteoarthritis of knee: Secondary | ICD-10-CM | POA: Diagnosis not present

## 2014-09-03 DIAGNOSIS — M47817 Spondylosis without myelopathy or radiculopathy, lumbosacral region: Secondary | ICD-10-CM | POA: Diagnosis not present

## 2014-09-03 DIAGNOSIS — M5126 Other intervertebral disc displacement, lumbar region: Secondary | ICD-10-CM | POA: Diagnosis not present

## 2014-09-03 DIAGNOSIS — M5416 Radiculopathy, lumbar region: Secondary | ICD-10-CM | POA: Diagnosis not present

## 2014-09-03 DIAGNOSIS — M4806 Spinal stenosis, lumbar region: Secondary | ICD-10-CM | POA: Diagnosis not present

## 2014-09-03 DIAGNOSIS — M069 Rheumatoid arthritis, unspecified: Secondary | ICD-10-CM | POA: Diagnosis not present

## 2014-09-14 NOTE — Op Note (Signed)
PATIENT NAME:  Bryan [name], Bryan [name] MR#:  [id number] DATE OF BIRTH:  [birth year]  DATE OF PROCEDURE:  02/08/2014  PREOPERATIVE DIAGNOSES:  1.  Hammertoe deformity of 2nd toe, left foot.  2.  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  POSTOPERATIVE DIAGNOSES:  1.  Hammertoe deformity of 2nd toe, left foot.  2.  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  PROCEDURES: 1.  Hammertoe repair of 2nd toe, left foot.  2.  Phalanx head resection from proximal phalanx with middle phalanx lateral resection of 5th toe, left foot.   SURGEON: Gerrit Heck. Kaidance Pantoja, DPM  ASSISTANT: None.   HISTORY OF PRESENT ILLNESS: The patient has had pain and discomfort to the left foot, at the 2nd and 5th toes for a number of years. The patient has rheumatoid arthritis and has multiple foot deformities. These toes have contracted and have caused rubbing, irritation and ulcerative-type lesions in the past.   ANESTHESIA: Local with MAC.   ANESTHESIOLOGIST: Julianne Handler, MD  ESTIMATED BLOOD LOSS: Negligible.   HEMOSTASIS: Ankle tourniquet 250 mmHg pressure.   OPERATIVE REPORT: The patient was brought to the OR and placed on the OR table in the supine position. At this point, after local anesthesia was achieved, the patient was prepped and draped in the usual sterile manner. Sedation was achieved by the anesthesia team. At this time, attention was directed to the dorsum of the left foot, to the 2nd toe, where a 2 semielliptical incisions were made over the PIPJ of the 2nd toe. This ellipse of skin was then removed. The extensor tendon was identified, incised transversely, and reflected proximally. A power sagittal saw was then used to resect the proximal phalanx head. At this point, the area was copiously irrigated. Extensor tendon was reapproximated with 4-0 Vicryl simple interrupted sutures. The toe was seen to sit in a much better position at this point and the skin was then  closed with 5-0 nylon horizontal mattress and simple interrupted combination.   At this time, attention was directed to the 5th toe, of the left foot, where 2 semielliptical incisions were made in oblique fashion across the PIP joint from proximal lateral to distal medial. This ellipse of skin was then removed. The extensor tendon was identified, incised transversely and reflected proximally. The head of the proximal phalanx was then resected with power equipment. Tissue was freed up laterally at this point, and the sagittal saw combination along with a power rasp was used to remove bony proliferation from the middle and distal phalanges on the lateral left 5th toe. Once this was accomplished, the area was checked with FluoroScan and good reduction was noted. The area was copiously irrigated and the extensor tendon was then reapproximated with 4-0 Vicryl in simple interrupted stitches. The skin was closed with 5-0 nylon horizontal mattress sutures. At this time, the area was blocked with 0.5% Marcaine plain. A sterile compressive dressing was placed across the wounds consisting of Xeroform gauze, 4 x 4's, Kling and Kerlix. The tourniquet was released. Prompt and complete vascularity was seen to return to all digits of the left foot. The patient appeared to tolerate the procedure and anesthesia well and left the OR to the recovery room with vital signs stable and neurovascular status intact.  ____________________________ Gerrit Heck. Darcey Demma, DPM mgt:sb D: 02/08/2014 08:30:41 ET T: 02/08/2014 10:33:21 ET JOB#: [id number]  cc: Gerrit Heck Annette Bertelson, DPM, <Dictator> Perry Mount MD ELECTRONICALLY SIGNED 03/12/2014 10:07

## 2014-09-16 LAB — SURGICAL PATHOLOGY

## 2014-09-17 DIAGNOSIS — E291 Testicular hypofunction: Secondary | ICD-10-CM | POA: Diagnosis not present

## 2014-09-20 DIAGNOSIS — N401 Enlarged prostate with lower urinary tract symptoms: Secondary | ICD-10-CM | POA: Diagnosis not present

## 2014-09-24 DIAGNOSIS — E291 Testicular hypofunction: Secondary | ICD-10-CM | POA: Diagnosis not present

## 2014-10-01 ENCOUNTER — Encounter: Payer: Self-pay | Admitting: Pain Medicine

## 2014-10-01 ENCOUNTER — Ambulatory Visit: Payer: Medicare Other | Attending: Pain Medicine | Admitting: Pain Medicine

## 2014-10-01 VITALS — BP 157/63 | HR 51 | Temp 98.1°F | Resp 16 | Ht 73.0 in | Wt 194.0 lb

## 2014-10-01 DIAGNOSIS — M5030 Other cervical disc degeneration, unspecified cervical region: Secondary | ICD-10-CM | POA: Insufficient documentation

## 2014-10-01 DIAGNOSIS — M4328 Fusion of spine, sacral and sacrococcygeal region: Secondary | ICD-10-CM | POA: Insufficient documentation

## 2014-10-01 DIAGNOSIS — M4806 Spinal stenosis, lumbar region: Secondary | ICD-10-CM | POA: Diagnosis not present

## 2014-10-01 DIAGNOSIS — M170 Bilateral primary osteoarthritis of knee: Secondary | ICD-10-CM | POA: Diagnosis not present

## 2014-10-01 DIAGNOSIS — M542 Cervicalgia: Secondary | ICD-10-CM | POA: Diagnosis present

## 2014-10-01 DIAGNOSIS — M5136 Other intervertebral disc degeneration, lumbar region: Secondary | ICD-10-CM | POA: Insufficient documentation

## 2014-10-01 DIAGNOSIS — M533 Sacrococcygeal disorders, not elsewhere classified: Secondary | ICD-10-CM | POA: Insufficient documentation

## 2014-10-01 DIAGNOSIS — M4726 Other spondylosis with radiculopathy, lumbar region: Secondary | ICD-10-CM

## 2014-10-01 DIAGNOSIS — M47817 Spondylosis without myelopathy or radiculopathy, lumbosacral region: Secondary | ICD-10-CM | POA: Insufficient documentation

## 2014-10-01 DIAGNOSIS — M069 Rheumatoid arthritis, unspecified: Secondary | ICD-10-CM | POA: Insufficient documentation

## 2014-10-01 DIAGNOSIS — M47816 Spondylosis without myelopathy or radiculopathy, lumbar region: Secondary | ICD-10-CM | POA: Insufficient documentation

## 2014-10-01 DIAGNOSIS — M549 Dorsalgia, unspecified: Secondary | ICD-10-CM | POA: Diagnosis present

## 2014-10-01 DIAGNOSIS — M47812 Spondylosis without myelopathy or radiculopathy, cervical region: Secondary | ICD-10-CM | POA: Insufficient documentation

## 2014-10-01 DIAGNOSIS — M5416 Radiculopathy, lumbar region: Secondary | ICD-10-CM | POA: Diagnosis not present

## 2014-10-01 MED ORDER — FENTANYL 25 MCG/HR TD PT72: 25.0000 ug | MEDICATED_PATCH | TRANSDERMAL | Status: DC

## 2014-10-01 MED ORDER — HYDROCODONE-ACETAMINOPHEN 5-325 MG PO TABS: ORAL_TABLET | ORAL | Status: DC

## 2014-10-01 NOTE — Progress Notes (Signed)
The patient is 74 year old gentleman returns to pain management Center for evaluation of pain involving the neck and entire back and upper extremity regions. Patient without trauma or change in events of daily living to call significant change in symptomatology. We discussed patient's condition and will continue fentanyl patch 25 g per hour and hydrocodone 5/325. We will consider interventional treatment should there be significant change in condition prior to scheduled return appointment patient is to follow-up with Dr. [name] and carried also as discussed and to call pain management for any change in condition   Physical examination  There was tenderness over the cervical facet cervical paraspinal musculature region with tenderness of the splenius capitate and occipitalis muscles as well. Palpation over the thoracic facet thoracic paraspinal musculature region was returns to palpation as well as muscle spasms. There was tenderness of the acromioclavicular and glenohumeral joint regions of moderate degree with subluxation of the digits of the hand with decreased grip strength bilaterally. Erythema of the digits of the joints were noted. Tenderness over the lumbar paraspinal musculature region lumbar facet region of moderate degree with straight leg raising tolerated to approximately 20 with without definite increased pain with dorsiflexion noted there was decreased EHL strength with deformity of the ankles There was tenderness over the acromioclavicular glenohumeral joint region Tinel and Phalen's maneuver associated with mild increased pain decreased grip strength. Abdomen soft without tenderness to palpation no costovertebral tenderness noted  Assessment  Rheumatoid arthritis  Degenerative disc disease lumbar spine and tissue changes L5-S1 with severe right foraminal stenosis probable right L5 nerve root encroachment with left foraminal stenosis annular fissure at L4-5 asymmetric left foraminal  stenosis at L3-4 with indeterminate left renal lesion likely a hemorrhagic cyst Renal MRI for further evaluation  Plan  Continue present medications. Fentanyl patch and hydrocodone  F/U PCP for evaliation of  BP and general medical  Condition.  F/U surgical evaluation.  F/U nrurological evaluation.  May consider radiofrequency rhizolysis or intraspinal procedures pending response to present treatment and F/U evaluation.  Patient to call Pain Management Center should patient have concerns prior to scheduled return appointment.

## 2014-10-01 NOTE — Patient Instructions (Addendum)
Continue present medications.  FU PCP for evaliation of  BP and general medical  Condition.  F/U surgical evaluation.  F/U nrurological evaluation.  May consider radiofrequency rhizolysis or intraspinal procedures pending response to present treatment and F/U evaluation  Patient to call Pain Management Center should patient have concerns prior to scheduled return appointment.Marland Kitchen

## 2014-10-01 NOTE — Progress Notes (Signed)
Discharged to home, ambulatory at 1330.  Discharge instructions given.  Teach back 3 done.  Scripts given for Fentanyl and Hydrocodone.

## 2014-10-01 NOTE — Progress Notes (Signed)
   Subjective:    Patient ID: Bryan [name], male    DOB: [birth year], 74 y.o.   MRN: [id number]  HPI    Review of Systems     Objective:   Physical Exam        Assessment & Plan:

## 2014-10-03 ENCOUNTER — Encounter: Payer: Medicare Other | Admitting: Pain Medicine

## 2014-10-27 ENCOUNTER — Other Ambulatory Visit: Payer: Self-pay | Admitting: Pain Medicine

## 2014-10-27 DIAGNOSIS — M533 Sacrococcygeal disorders, not elsewhere classified: Secondary | ICD-10-CM

## 2014-10-27 DIAGNOSIS — M47812 Spondylosis without myelopathy or radiculopathy, cervical region: Secondary | ICD-10-CM

## 2014-10-27 DIAGNOSIS — M4726 Other spondylosis with radiculopathy, lumbar region: Secondary | ICD-10-CM

## 2014-10-27 DIAGNOSIS — M069 Rheumatoid arthritis, unspecified: Secondary | ICD-10-CM

## 2014-10-28 ENCOUNTER — Other Ambulatory Visit: Payer: Medicare Other

## 2014-10-28 DIAGNOSIS — E291 Testicular hypofunction: Secondary | ICD-10-CM

## 2014-10-29 ENCOUNTER — Telehealth: Payer: Self-pay | Admitting: Urology

## 2014-10-29 LAB — TESTOSTERONE: Testosterone: 721 ng/dL (ref 348–1197)

## 2014-10-29 NOTE — Telephone Encounter (Signed)
-----   Message from Nori Riis, PA-C sent at 10/29/2014  8:23 AM EDT ----- Serum testosterone is good. We will see him in 4 months. At that appointment, we will need a morning testosterone draw, PSA, hematocrit, DRE and we will need to check the status of his Testopel coverage.

## 2014-10-29 NOTE — Telephone Encounter (Signed)
The pt was notified no questions or concerns. 

## 2014-10-29 NOTE — Telephone Encounter (Signed)
I attempted to contact pt, no answer. LMOM to return my call.

## 2014-10-30 ENCOUNTER — Encounter: Payer: Self-pay | Admitting: Pain Medicine

## 2014-10-30 ENCOUNTER — Ambulatory Visit: Payer: Medicare Other | Attending: Pain Medicine | Admitting: Pain Medicine

## 2014-10-30 VITALS — BP 110/51 | HR 61 | Temp 98.1°F | Resp 16 | Ht 73.0 in | Wt 194.0 lb

## 2014-10-30 DIAGNOSIS — M79605 Pain in left leg: Secondary | ICD-10-CM | POA: Diagnosis present

## 2014-10-30 DIAGNOSIS — M47812 Spondylosis without myelopathy or radiculopathy, cervical region: Secondary | ICD-10-CM

## 2014-10-30 DIAGNOSIS — Z96652 Presence of left artificial knee joint: Secondary | ICD-10-CM | POA: Insufficient documentation

## 2014-10-30 DIAGNOSIS — M542 Cervicalgia: Secondary | ICD-10-CM | POA: Diagnosis present

## 2014-10-30 DIAGNOSIS — M533 Sacrococcygeal disorders, not elsewhere classified: Secondary | ICD-10-CM

## 2014-10-30 DIAGNOSIS — N289 Disorder of kidney and ureter, unspecified: Secondary | ICD-10-CM | POA: Insufficient documentation

## 2014-10-30 DIAGNOSIS — M4806 Spinal stenosis, lumbar region: Secondary | ICD-10-CM | POA: Insufficient documentation

## 2014-10-30 DIAGNOSIS — M5126 Other intervertebral disc displacement, lumbar region: Secondary | ICD-10-CM | POA: Diagnosis not present

## 2014-10-30 DIAGNOSIS — M5030 Other cervical disc degeneration, unspecified cervical region: Secondary | ICD-10-CM | POA: Insufficient documentation

## 2014-10-30 DIAGNOSIS — M5416 Radiculopathy, lumbar region: Secondary | ICD-10-CM | POA: Diagnosis not present

## 2014-10-30 DIAGNOSIS — M5136 Other intervertebral disc degeneration, lumbar region: Secondary | ICD-10-CM | POA: Insufficient documentation

## 2014-10-30 DIAGNOSIS — M069 Rheumatoid arthritis, unspecified: Secondary | ICD-10-CM | POA: Diagnosis not present

## 2014-10-30 DIAGNOSIS — M170 Bilateral primary osteoarthritis of knee: Secondary | ICD-10-CM | POA: Diagnosis not present

## 2014-10-30 DIAGNOSIS — M546 Pain in thoracic spine: Secondary | ICD-10-CM | POA: Diagnosis present

## 2014-10-30 DIAGNOSIS — M47817 Spondylosis without myelopathy or radiculopathy, lumbosacral region: Secondary | ICD-10-CM | POA: Diagnosis not present

## 2014-10-30 DIAGNOSIS — M4726 Other spondylosis with radiculopathy, lumbar region: Secondary | ICD-10-CM

## 2014-10-30 MED ORDER — HYDROCODONE-ACETAMINOPHEN 5-325 MG PO TABS: ORAL_TABLET | ORAL | Status: DC

## 2014-10-30 MED ORDER — FENTANYL 25 MCG/HR TD PT72: 25.0000 ug | MEDICATED_PATCH | TRANSDERMAL | Status: DC

## 2014-10-30 NOTE — Patient Instructions (Addendum)
Continue present medications. Duragesic patch and hydrocodone acetaminophen  F/U PCP for evaliation of  BP and general medical  condition.  F/U surgical evaluation.  F/U neurological evaluation.  May consider radiofrequency rhizolysis or intraspinal procedures pending response to present treatment and F/U evaluation.  Patient to call Pain Management Center should patient have concerns prior to scheduled return appointment.

## 2014-10-30 NOTE — Progress Notes (Signed)
Discharged to home ambulatory with scripts for hydrocodone and duragesic patch.

## 2014-10-30 NOTE — Progress Notes (Signed)
   Subjective:    Patient ID: Bryan [name], male    DOB: 09/30/1940, 74 y.o.   MRN: [id number]  HPI  Patient is 74 year old gentleman returns a [facility] Center for further evaluation and treatment of pain involving the neck and entire back upper and lower extremity regions. Patient with diagnosis rheumatoid arthritis as well as multilevel degenerative changes of the cervical thoracic and lumbar spine. Patient with pain fairly well-controlled at this time with the use of fentanyl patch and hydrocodone acetaminophen for breakthrough pain. Patient states that most bothersome pain involves the ankle and feet. We discussed patient undergoing follow-up evaluation Dr. [name]. We will avoid eventual treatment at this time and will await follow-up evaluation with Dr. [name] and return appointment in [place] to consider modification of treatment regimen as discussed and explained to patient on today's visit. Patient was understanding and agree with suggested treatment plan.   Review of Systems     Objective:   Physical Exam   tenderness of the splenius capitis and occipitalis musculature region of mild degree with mild tennis over the cervical and thoracic facet regions. There was tenderness over the region of the acromioclavicular glenohumeral joint region a moderate degree. Patient was with decreased grip strength with subluxation of the digits of the hand. There was no increased warmth and erythema of the joints noted. Palpation over the thoracic facet thoracic paraspinal musculature region was associated with moderate discomfort. No crepitus of the thoracic region was noted. Palpation of the lumbar paraspinal muscular region lumbar facet region associated with moderate discomfort with lateral bending and rotation reproducing moderate discomfort. There was tenderness over the PSIS and PII S region as well as the gluteal and piriformis musculature region on the left as well as on  the right. Straight leg raising limited to approximately 20 without increased pain with dorsiflexion noted. Moderate tenderness of the greater degree iliotibial band region. These were with tenderness to palpation. No definite sensory deficit of dermatomal distribution of the lower extremities noted. Abdomen nontender and no costovertebral tenderness noted.            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 The renal lesion likely represents hemorrhagic cyst. Small papillary tumor could have similar appearance although no aggressive characteristics are suggested. Renal MRI recommended for further evaluation of scan.  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  F/U PCP for evaliation of  BP and general medical  condition.  F/U surgical evaluation.  Follow-up Dr. [name] as discussed  F/U neurological evaluation.  May consider radiofrequency rhizolysis or intraspinal procedures pending response to present treatment and F/U evaluation.  Patient to call Pain Management Center should patient have concerns prior to scheduled return appointment.

## 2014-11-04 ENCOUNTER — Encounter: Payer: Medicare Other | Admitting: Pain Medicine

## 2014-11-07 ENCOUNTER — Encounter: Payer: Self-pay | Admitting: Cardiovascular Disease

## 2014-11-07 ENCOUNTER — Ambulatory Visit (INDEPENDENT_AMBULATORY_CARE_PROVIDER_SITE_OTHER): Payer: Medicare Other | Admitting: Cardiovascular Disease

## 2014-11-07 VITALS — BP 140/82 | HR 55 | Ht 73.0 in | Wt 200.5 lb

## 2014-11-07 DIAGNOSIS — I480 Paroxysmal atrial fibrillation: Secondary | ICD-10-CM

## 2014-11-07 DIAGNOSIS — Z954 Presence of other heart-valve replacement: Secondary | ICD-10-CM

## 2014-11-07 DIAGNOSIS — I2510 Atherosclerotic heart disease of native coronary artery without angina pectoris: Secondary | ICD-10-CM

## 2014-11-07 DIAGNOSIS — I7121 Aneurysm of the ascending aorta, without rupture: Secondary | ICD-10-CM

## 2014-11-07 DIAGNOSIS — I10 Essential (primary) hypertension: Secondary | ICD-10-CM | POA: Diagnosis not present

## 2014-11-07 DIAGNOSIS — I712 Thoracic aortic aneurysm, without rupture: Secondary | ICD-10-CM | POA: Diagnosis not present

## 2014-11-07 DIAGNOSIS — E785 Hyperlipidemia, unspecified: Secondary | ICD-10-CM

## 2014-11-07 DIAGNOSIS — Z952 Presence of prosthetic heart valve: Secondary | ICD-10-CM

## 2014-11-07 NOTE — Assessment & Plan Note (Signed)
Ideally LDL should be less than 70. We'll monitor for now, continue simvastatin

## 2014-11-07 NOTE — Progress Notes (Signed)
Patient ID: Bryan [name], male    DOB: Jul 19, 1940, 74 y.o.   MRN: [id number]  HPI Comments: [name] is a pleasant 74 year old gentleman with a history of Ascending aorta aneurysm, bicuspid aortic valve noted in 2009 with cardiac catheterization at that time showing mild to moderate CAD, who underwent aVR with bioprosthetic valve, and ascending aorta grafting in 2010, history of hypertension, hyperlipidemia, mild chronic renal insufficiency, total knee replacement in 2004, appendix rupture and gallbladder surgery in 2010, who presents for routine follow-up of his bioprosthetic aortic valve. Remote history of postoperative atrial fibrillation and 2010 He is a retired Software engineer   in follow-up today, he reports that he is doing well. Denies having any symptoms.  No shortness of breath, no chest pain, no leg edema  Scheduled for echocardiogram July 2016  blood pressure well controlled at home  Prior hemoglobin A1c 6.0 , cholesterol 150   EKG on today's visit shows normal sinus rhythm with rate 55 bpm, first degree AV block   other past medical history Echocardiogram July 2015 showing mild to moderate aortic valve stenosis, 4.5 cm ascending aorta. No prior records available  Notes indicate some degree of carotid disease though details are not available.   Allergies  Allergen Reactions  . Demerol [Meperidine]     hives  . Penicillins     Hives     Outpatient Encounter Prescriptions as of 11/07/2014  Medication Sig  . amoxicillin (AMOXIL) 500 MG capsule Take 4 capsules (2,000 mg total) by mouth once. Take 4 caps 1 hour prior to dental appointment.  Marland Kitchen aspirin 325 MG tablet Take 325 mg by mouth daily.  . brimonidine (ALPHAGAN) 0.2 % ophthalmic solution Place 1 drop into both eyes daily.  . cetirizine (ZYRTEC) 10 MG tablet Take 10 mg by mouth daily.  . Cholecalciferol (VITAMIN D) 400 UNITS capsule Take 400 Units by mouth daily.  Marland Kitchen docusate calcium (SURFAK) 240 MG capsule Take  240 mg by mouth daily.  Marland Kitchen donepezil (ARICEPT) 10 MG tablet Take 10 mg by mouth at bedtime.  . fenofibrate 160 MG tablet Take 1 tablet (160 mg total) by mouth daily.  . fentaNYL (DURAGESIC - DOSED MCG/HR) 25 MCG/HR patch Place 1 patch (25 mcg total) onto the skin every 3 (three) days.  Marland Kitchen FLUoxetine (PROZAC) 20 MG capsule Take 1 capsule (20 mg total) by mouth daily.  . folic acid (FOLVITE) 1 MG tablet Take 1 mg by mouth 2 (two) times daily.   Marland Kitchen gabapentin (NEURONTIN) 800 MG tablet Take 1 tablet (800 mg total) by mouth 3 (three) times daily as needed.  Marland Kitchen HYDROcodone-acetaminophen (NORCO/VICODIN) 5-325 MG per tablet Limit 1-3 tablets by mouth per day if tolerated  . hydroxychloroquine (PLAQUENIL) 200 MG tablet Take 200 mg by mouth daily.  Marland Kitchen lisinopril (PRINIVIL,ZESTRIL) 20 MG tablet Take 1 tablet (20 mg total) by mouth daily. (Patient taking differently: Take 20 mg by mouth daily at 10 pm. )  . lisinopril-hydrochlorothiazide (PRINZIDE,ZESTORETIC) 20-25 MG per tablet TAKE ONE TABLET BY MOUTH DAILY  . predniSONE (DELTASONE) 5 MG tablet Take 5 mg by mouth as needed.   . simvastatin (ZOCOR) 40 MG tablet Take 1 tablet (40 mg total) by mouth daily.  . tamsulosin (FLOMAX) 0.4 MG CAPS capsule Take 1 capsule (0.4 mg total) by mouth daily.  . vitamin B-12 (CYANOCOBALAMIN) 1000 MCG tablet Take 1,000 mcg by mouth daily.   No facility-administered encounter medications on file as of 11/07/2014.    Past Medical History  Diagnosis Date  . Coronary artery disease   . Hyperlipidemia   . H/O thoracic aortic aneurysm repair   . Valvular heart disease   . Paroxysmal a-fib   . Hypertension   . Arrhythmia   . Glaucoma   . Arthritis   . Depression   . Allergy   . History of blood transfusion   . History of chicken pox   . Chronic back pain     Past Surgical History  Procedure Laterality Date  . Thoracic aortic aneurysm repair  2010  . Aortic valve replacement    . Cholecystectomy  2010  . Appendectomy   2010    Social History  reports that he quit smoking about 33 years ago. His smoking use included Cigarettes. He has a 76 pack-year smoking history. He does not have any smokeless tobacco history on file. He reports that he drinks about 0.6 oz of alcohol per week. He reports that he does not use illicit drugs.  Family History family history includes Asthma in his mother; Heart attack (age of onset: 68) in his father; Heart failure in his mother; Hypertension in his mother and sister.   Review of Systems  Constitutional: Negative.   Respiratory: Negative.   Cardiovascular: Negative.   Gastrointestinal: Negative.   Musculoskeletal: Negative.   Neurological: Negative.   Hematological: Negative.   Psychiatric/Behavioral: Negative.   All other systems reviewed and are negative.   BP 140/82 mmHg  Pulse 55  Ht 6\' 1"  (1.854 m)  Wt 200 lb 8 oz (90.946 kg)  BMI 26.46 kg/m2  Physical Exam  Constitutional: He is oriented to person, place, and time. He appears well-developed and well-nourished.  HENT:  Head: Normocephalic.  Nose: Nose normal.  Mouth/Throat: Oropharynx is clear and moist.  Eyes: Conjunctivae are normal. Pupils are equal, round, and reactive to light.  Neck: Normal range of motion. Neck supple. No JVD present.  Cardiovascular: Normal rate, regular rhythm, S1 normal, S2 normal and intact distal pulses.  Exam reveals no gallop and no friction rub.   Murmur heard.  Systolic murmur is present with a grade of 2/6  Pulmonary/Chest: Effort normal and breath sounds normal. No respiratory distress. He has no wheezes. He has no rales. He exhibits no tenderness.  Abdominal: Soft. Bowel sounds are normal. He exhibits no distension. There is no tenderness.  Musculoskeletal: Normal range of motion. He exhibits no edema or tenderness.  Lymphadenopathy:    He has no cervical adenopathy.  Neurological: He is alert and oriented to person, place, and time. Coordination normal.  Skin: Skin  is warm and dry. No rash noted. No erythema.  Psychiatric: He has a normal mood and affect. His behavior is normal. Judgment and thought content normal.      Assessment and Plan   Nursing note and vitals reviewed.

## 2014-11-07 NOTE — Assessment & Plan Note (Signed)
Blood pressure is well controlled on today's visit. No changes made to the medications. 

## 2014-11-07 NOTE — Patient Instructions (Signed)
You are doing well. No medication changes were made.  We will  Order an echocardiogram for ascending aorta aneursym  Please call us if you have new issues that need to be addressed before your next appt.  Your physician wants you to follow-up in: 6 months.  You will receive a reminder letter in the mail two months in advance. If you don't receive a letter, please call our office to schedule the follow-up appointment.

## 2014-11-07 NOTE — Assessment & Plan Note (Signed)
Currently with no symptoms of angina. No further workup at this time. Continue current medication regimen. 

## 2014-11-07 NOTE — Assessment & Plan Note (Signed)
Echocardiogram July 2016 to evaluate his aVR  Previously noted to have elevated gradient

## 2014-11-07 NOTE — Assessment & Plan Note (Signed)
Maintaining normal sinus rhythm. No medication changes made 

## 2014-11-07 NOTE — Assessment & Plan Note (Signed)
Repeat echocardiogram ordered for July 2016  For ascending  Aorta aneurysm  Previously noted to be 4.5 cm in 2015

## 2014-11-08 ENCOUNTER — Ambulatory Visit (INDEPENDENT_AMBULATORY_CARE_PROVIDER_SITE_OTHER): Payer: Medicare Other

## 2014-11-08 ENCOUNTER — Other Ambulatory Visit: Payer: Self-pay

## 2014-11-08 DIAGNOSIS — I712 Thoracic aortic aneurysm, without rupture: Secondary | ICD-10-CM

## 2014-11-08 DIAGNOSIS — I7121 Aneurysm of the ascending aorta, without rupture: Secondary | ICD-10-CM

## 2014-11-20 DIAGNOSIS — G8929 Other chronic pain: Secondary | ICD-10-CM | POA: Diagnosis not present

## 2014-11-20 DIAGNOSIS — M0579 Rheumatoid arthritis with rheumatoid factor of multiple sites without organ or systems involvement: Secondary | ICD-10-CM | POA: Diagnosis not present

## 2014-11-20 DIAGNOSIS — M25511 Pain in right shoulder: Secondary | ICD-10-CM | POA: Diagnosis not present

## 2014-11-26 ENCOUNTER — Other Ambulatory Visit: Payer: Self-pay | Admitting: *Deleted

## 2014-11-26 MED ORDER — FLUOXETINE HCL 20 MG PO CAPS: 20.0000 mg | ORAL_CAPSULE | Freq: Every day | ORAL | Status: DC

## 2014-11-28 ENCOUNTER — Encounter: Payer: Medicare Other | Admitting: Pain Medicine

## 2014-12-02 ENCOUNTER — Other Ambulatory Visit: Payer: Self-pay

## 2014-12-02 ENCOUNTER — Ambulatory Visit: Payer: Medicare Other | Attending: Pain Medicine | Admitting: Pain Medicine

## 2014-12-02 VITALS — BP 125/61 | HR 62 | Temp 98.0°F | Resp 16 | Ht 73.0 in | Wt 194.0 lb

## 2014-12-02 DIAGNOSIS — M170 Bilateral primary osteoarthritis of knee: Secondary | ICD-10-CM | POA: Diagnosis not present

## 2014-12-02 DIAGNOSIS — M5030 Other cervical disc degeneration, unspecified cervical region: Secondary | ICD-10-CM | POA: Insufficient documentation

## 2014-12-02 DIAGNOSIS — M4806 Spinal stenosis, lumbar region: Secondary | ICD-10-CM | POA: Insufficient documentation

## 2014-12-02 DIAGNOSIS — M79605 Pain in left leg: Secondary | ICD-10-CM | POA: Diagnosis present

## 2014-12-02 DIAGNOSIS — M542 Cervicalgia: Secondary | ICD-10-CM | POA: Diagnosis present

## 2014-12-02 DIAGNOSIS — M533 Sacrococcygeal disorders, not elsewhere classified: Secondary | ICD-10-CM | POA: Diagnosis not present

## 2014-12-02 DIAGNOSIS — M5136 Other intervertebral disc degeneration, lumbar region: Secondary | ICD-10-CM | POA: Diagnosis not present

## 2014-12-02 DIAGNOSIS — M47817 Spondylosis without myelopathy or radiculopathy, lumbosacral region: Secondary | ICD-10-CM | POA: Diagnosis not present

## 2014-12-02 DIAGNOSIS — M069 Rheumatoid arthritis, unspecified: Secondary | ICD-10-CM | POA: Diagnosis not present

## 2014-12-02 DIAGNOSIS — M47812 Spondylosis without myelopathy or radiculopathy, cervical region: Secondary | ICD-10-CM

## 2014-12-02 DIAGNOSIS — M546 Pain in thoracic spine: Secondary | ICD-10-CM | POA: Diagnosis present

## 2014-12-02 DIAGNOSIS — M4726 Other spondylosis with radiculopathy, lumbar region: Secondary | ICD-10-CM

## 2014-12-02 DIAGNOSIS — M5416 Radiculopathy, lumbar region: Secondary | ICD-10-CM | POA: Diagnosis not present

## 2014-12-02 MED ORDER — HYDROCODONE-ACETAMINOPHEN 5-325 MG PO TABS: ORAL_TABLET | ORAL | Status: DC

## 2014-12-02 MED ORDER — FENTANYL 25 MCG/HR TD PT72: 25.0000 ug | MEDICATED_PATCH | TRANSDERMAL | Status: DC

## 2014-12-02 NOTE — Progress Notes (Signed)
   Subjective:    Patient ID: Bryan [name], male    DOB: 01/01/1941, 74 y.o.   MRN: [id number]  HPI  Patient is 74 year old gentleman returns to [facility] for further evaluation and treatment of pain involving the neck and entire back upper and lower extremity regions. Patient states that his medications consisting of fentanyl patch with hydrocodone acetaminophen has been quite effective in terms of controlling his pain. On today's visit we also discussed patient's memory. Patient will follow-up with Dr. Melrose [name] in this regard. At the present time patient is taking Aricept. We may consider modification of patient's medications pending further further evaluation as discussed. We will avoid interventional treatment. All understanding and in agreement status treatment plan      Review of Systems     Objective:   Physical Exam there was mild tenderness of the splenius capitate and occipitalis musculature region on the left as well as on the right. There appeared to be unremarkable Spurling's maneuver. Palpation of the cervical facet cervical paraspinal musculature region and the lumbar facet lumbar paraspinal musculature region reproduces moderate discomfort. Tinel and Phalen's maneuver were without increase of pain of significant degree patient appeared to be slightly decreased grip strength. There was tenderness over the thoracic facet thoracic paraspinal musculature region without crepitus of the thoracic region noted. Palpation over the lumbar paraspinal muscles lumbar facet region was a tends to palpation of moderate degree with lateral bending and rotation and extension and palpation of the lumbar facets reproducing moderate discomfort. There was moderate tenderness over the PSIS and PII S region of the left as well as on the right. No sensory deficit of dermatomal description detected and there was negative clonus and negative Homans. Abdomen is nontender with no costovertebral  tenderness noted.    Assessment & Plan:  Degenerative disc disease lumbar spine Asymmetric bulging L5-S1 severe right foraminal stenosis probable right L5 nerve root encroachment, left foraminal stenosis, annular fissure L5-S1, asymmetric left foraminal stenosis at L3-4 with indeterminate left renal lesion likely a hemorrhagic cyst. A small papillary tumor could have a similar appearance.  Lumbar facet syndrome  Lumbar stenosis with neurogenic claudication  Rheumatoid arthritis  Sacroiliac joint dysfunction  Degenerative disc disease cervical spine    Plan   Continue present medications fentanyl patch with hydrocodone acetaminophen for breakthrough pain. We will also request permission to prescribe patient's Neurontin for him and will do such if patient's physician is in agreement  F/U PCP Bryan [name]   for evaliation of  BP and general medical  condition.  F/U surgical evaluation  F/U neurological evaluationWith Dr. Melrose [name] for evaluation of memory and general neurological reevaluation  F/U Bryan [name] as discussed   May consider radiofrequency rhizolysis or intraspinal procedures pending response to present treatment and F/U evaluation.  Patient to call Pain Management Center should patient have concerns prior to scheduled return appointment.

## 2014-12-02 NOTE — Progress Notes (Signed)
Safety precautions to be maintained throughout the outpatient stay will include: orient to surroundings, keep bed in low position, maintain call bell within reach at all times, provide assistance with transfer out of bed and ambulation. Discharged ambulatory at 12:45pm

## 2014-12-02 NOTE — Patient Instructions (Addendum)
Continue present medications. Fentanyl patch and hydrocodone acetaminophen  F/U PCP for evaliation of  BP and general medical  condition.  F/U surgical evaluation  F/U neurological evaluation  F/U Dr [name] as discussed  May consider radiofrequency rhizolysis or intraspinal procedures pending response to present treatment and F/U evaluation.  Patient to call Pain Management Center should patient have concerns prior to scheduled return appointment.   A prescription for HYDROCODONE and DURAGESIC was given to you today.

## 2014-12-02 NOTE — Telephone Encounter (Signed)
Pharmacy interface for refill on Gabapentin.  Last OV was 1.26.16 and then had a cancelled appt on 2.12.16 Please advise?

## 2014-12-03 MED ORDER — GABAPENTIN 800 MG PO TABS
800.0000 mg | ORAL_TABLET | Freq: Three times a day (TID) | ORAL | Status: DC | PRN
Start: 2014-12-03 — End: 2015-05-29

## 2014-12-05 DIAGNOSIS — H04123 Dry eye syndrome of bilateral lacrimal glands: Secondary | ICD-10-CM | POA: Diagnosis not present

## 2014-12-05 DIAGNOSIS — H40023 Open angle with borderline findings, high risk, bilateral: Secondary | ICD-10-CM | POA: Diagnosis not present

## 2014-12-07 ENCOUNTER — Other Ambulatory Visit: Payer: Self-pay | Admitting: Nurse Practitioner

## 2014-12-13 ENCOUNTER — Other Ambulatory Visit: Payer: Self-pay | Admitting: Cardiovascular Disease

## 2014-12-19 ENCOUNTER — Other Ambulatory Visit: Payer: Self-pay | Admitting: Pain Medicine

## 2014-12-30 ENCOUNTER — Ambulatory Visit: Payer: Medicare Other | Attending: Pain Medicine | Admitting: Pain Medicine

## 2014-12-30 ENCOUNTER — Encounter: Payer: Self-pay | Admitting: Pain Medicine

## 2014-12-30 VITALS — BP 129/83 | HR 64 | Temp 98.3°F | Resp 15 | Ht 73.0 in | Wt 192.0 lb

## 2014-12-30 DIAGNOSIS — M069 Rheumatoid arthritis, unspecified: Secondary | ICD-10-CM | POA: Diagnosis not present

## 2014-12-30 DIAGNOSIS — M5030 Other cervical disc degeneration, unspecified cervical region: Secondary | ICD-10-CM | POA: Insufficient documentation

## 2014-12-30 DIAGNOSIS — M47812 Spondylosis without myelopathy or radiculopathy, cervical region: Secondary | ICD-10-CM

## 2014-12-30 DIAGNOSIS — M533 Sacrococcygeal disorders, not elsewhere classified: Secondary | ICD-10-CM | POA: Diagnosis not present

## 2014-12-30 DIAGNOSIS — M5416 Radiculopathy, lumbar region: Secondary | ICD-10-CM | POA: Diagnosis not present

## 2014-12-30 DIAGNOSIS — M546 Pain in thoracic spine: Secondary | ICD-10-CM | POA: Diagnosis present

## 2014-12-30 DIAGNOSIS — M47817 Spondylosis without myelopathy or radiculopathy, lumbosacral region: Secondary | ICD-10-CM | POA: Diagnosis not present

## 2014-12-30 DIAGNOSIS — M4806 Spinal stenosis, lumbar region: Secondary | ICD-10-CM | POA: Diagnosis not present

## 2014-12-30 DIAGNOSIS — M5136 Other intervertebral disc degeneration, lumbar region: Secondary | ICD-10-CM | POA: Diagnosis not present

## 2014-12-30 DIAGNOSIS — M542 Cervicalgia: Secondary | ICD-10-CM | POA: Diagnosis present

## 2014-12-30 DIAGNOSIS — M170 Bilateral primary osteoarthritis of knee: Secondary | ICD-10-CM | POA: Diagnosis not present

## 2014-12-30 DIAGNOSIS — M4726 Other spondylosis with radiculopathy, lumbar region: Secondary | ICD-10-CM

## 2014-12-30 MED ORDER — HYDROCODONE-ACETAMINOPHEN 5-325 MG PO TABS: ORAL_TABLET | ORAL | Status: DC

## 2014-12-30 MED ORDER — FENTANYL 25 MCG/HR TD PT72: MEDICATED_PATCH | TRANSDERMAL | Status: DC

## 2014-12-30 NOTE — Progress Notes (Signed)
   Subjective:    Patient ID: Bryan [name], male    DOB: 01-Jan-1941, 74 y.o.   MRN: [id number]  HPI  Patient 74 year old gentleman [facility] evaluation and treatment of pain involving the neck and entire back upper and lower extremity regions. Patient with pain involving the feet is well. Patient denies trauma change in events of daily living the call significant change in symptomatology. Patient continues fentanyl patch use as well as hydrocodone acetaminophen and states that his pain is fairly well-controlled at this time. We will avoid interventional treatment and will continue presently prescribed medications. With understanding and agreement with suggested treatment plan.   Review of Systems     Objective:   Physical Exam  Mild tenderness of spinous Occipitalis regions. Palpation over the region of the cervical and thoracic facet regions were with moderate discomfort. There was moderate tenderness of the acromioclavicular and glenohumeral joint regions. Patient was with decreased grip strength. Subluxation of the digits of the hand were noted. No increased warmth or erythema of the hands noted Tinel and Phalen's maneuver associated with moderate discomfort. There was tenderness over the region thoracic facet thoracic paraspinal musculature region with moderate discomfort and muscle spasms noted with palpation over the lumbar paraspinal musculature region lumbar facet region reproducing moderate discomfort. Lateral bending and rotation and extension and palpation of the lumbar facets reproduce moderate discomfort. Raising was tolerated to 20 without increase of pain with dorsiflexion noted. There was tenderness over the greater trochanteric region iliotibial band region of moderate degree. EHL strength appeared to be decreased. There was no sensory deficit of dermatomal distribution detected. There was negative clonus and negative Homans. Abdomen was  nontender with no costovertebral angle tenderness noted.      Assessment & Plan:   Degenerative disc disease lumbar spine Asymmetric bulging L5-S1 severe right foraminal stenosis probable right L5 nerve root encroachment, left foraminal stenosis, annular fissure L5-S1, asymmetric left foraminal stenosis at L3-4 with indeterminate left renal lesion likely a hemorrhagic cyst. A small papillary tumor could have a similar appearance.  Lumbar facet syndrome  Lumbar stenosis with neurogenic claudication  Rheumatoid arthritis  Sacroiliac joint dysfunction  Degenerative disc disease cervical spine    Plan   Continue present medicatio fentanyl patch and hydrocodone acetaminophen   F/U PCP for evaliation of  BP and general medical  condition  F/U surgical evaluation  F/U neurological evaluation  F/U Dr. [name] as discussed  May consider radiofrequency rhizolysis or intraspinal procedures pending response to present treatment and F/U evaluation   [facility] to call [facility] should patient have concerns prior to scheduled return appointmen.

## 2014-12-30 NOTE — Progress Notes (Signed)
Safety precautions to be maintained throughout the outpatient stay will include: orient to surroundings, keep bed in low position, maintain call bell within reach at all times, provide assistance with transfer out of bed and ambulation.  Discharged ambulatory at 8:25 am

## 2014-12-30 NOTE — Patient Instructions (Addendum)
Continue present medication fentanyl patch and hydrocodone acetaminophen  F/U PCP for evaliation of  BP and general medical  condition  F/U surgical evaluation  F/U neurological evaluation  F/U rheumatological evaluation  May consider radiofrequency rhizolysis or intraspinal procedures pending response to present treatment and F/U evaluation   Patient to call [facility] should patient have concerns prior to scheduled return appointment.   A prescription for DURAGESIC AND HYDROCODONE was given to you today.

## 2014-12-31 ENCOUNTER — Encounter: Payer: Medicare Other | Admitting: Pain Medicine

## 2015-01-28 DIAGNOSIS — M069 Rheumatoid arthritis, unspecified: Secondary | ICD-10-CM | POA: Diagnosis not present

## 2015-01-28 DIAGNOSIS — M79672 Pain in left foot: Secondary | ICD-10-CM | POA: Diagnosis not present

## 2015-01-28 DIAGNOSIS — Z79899 Other long term (current) drug therapy: Secondary | ICD-10-CM | POA: Diagnosis not present

## 2015-01-28 DIAGNOSIS — N189 Chronic kidney disease, unspecified: Secondary | ICD-10-CM | POA: Diagnosis not present

## 2015-01-30 ENCOUNTER — Encounter: Payer: Self-pay | Admitting: Pain Medicine

## 2015-01-30 ENCOUNTER — Ambulatory Visit: Payer: Medicare Other | Attending: Pain Medicine | Admitting: Pain Medicine

## 2015-01-30 VITALS — BP 92/48 | HR 63 | Temp 96.5°F | Resp 16 | Ht 73.0 in | Wt 190.0 lb

## 2015-01-30 DIAGNOSIS — M47817 Spondylosis without myelopathy or radiculopathy, lumbosacral region: Secondary | ICD-10-CM | POA: Diagnosis not present

## 2015-01-30 DIAGNOSIS — M533 Sacrococcygeal disorders, not elsewhere classified: Secondary | ICD-10-CM | POA: Diagnosis not present

## 2015-01-30 DIAGNOSIS — M4806 Spinal stenosis, lumbar region: Secondary | ICD-10-CM | POA: Insufficient documentation

## 2015-01-30 DIAGNOSIS — M5030 Other cervical disc degeneration, unspecified cervical region: Secondary | ICD-10-CM | POA: Insufficient documentation

## 2015-01-30 DIAGNOSIS — M48061 Spinal stenosis, lumbar region without neurogenic claudication: Secondary | ICD-10-CM

## 2015-01-30 DIAGNOSIS — M5416 Radiculopathy, lumbar region: Secondary | ICD-10-CM | POA: Diagnosis not present

## 2015-01-30 DIAGNOSIS — M5126 Other intervertebral disc displacement, lumbar region: Secondary | ICD-10-CM | POA: Diagnosis not present

## 2015-01-30 DIAGNOSIS — M545 Low back pain: Secondary | ICD-10-CM | POA: Diagnosis present

## 2015-01-30 DIAGNOSIS — M170 Bilateral primary osteoarthritis of knee: Secondary | ICD-10-CM | POA: Diagnosis not present

## 2015-01-30 DIAGNOSIS — M79605 Pain in left leg: Secondary | ICD-10-CM | POA: Diagnosis present

## 2015-01-30 DIAGNOSIS — M069 Rheumatoid arthritis, unspecified: Secondary | ICD-10-CM | POA: Insufficient documentation

## 2015-01-30 DIAGNOSIS — M5136 Other intervertebral disc degeneration, lumbar region: Secondary | ICD-10-CM | POA: Insufficient documentation

## 2015-01-30 DIAGNOSIS — M47812 Spondylosis without myelopathy or radiculopathy, cervical region: Secondary | ICD-10-CM

## 2015-01-30 DIAGNOSIS — M79604 Pain in right leg: Secondary | ICD-10-CM | POA: Diagnosis present

## 2015-01-30 DIAGNOSIS — M47816 Spondylosis without myelopathy or radiculopathy, lumbar region: Secondary | ICD-10-CM

## 2015-01-30 MED ORDER — FENTANYL 25 MCG/HR TD PT72: MEDICATED_PATCH | TRANSDERMAL | Status: DC

## 2015-01-30 MED ORDER — HYDROCODONE-ACETAMINOPHEN 5-325 MG PO TABS: ORAL_TABLET | ORAL | Status: DC

## 2015-01-30 NOTE — Progress Notes (Signed)
Safety precautions to be maintained throughout the outpatient stay will include: orient to surroundings, keep bed in low position, maintain call bell within reach at all times, provide assistance with transfer out of bed and ambulation.  

## 2015-01-30 NOTE — Progress Notes (Signed)
Subjective:    Patient ID: Bryan [name], male    DOB: 09/25/1940, 74 y.o.   MRN: [id number]  HPI  Patient 74 year old gentleman [facility] for further evaluation and treatment of pain involving the lower back lower extremity region. Patient states that he is with pain fairly well-controlled present treatment regimen. Patient also states that he was informed by Dr. Elvina [name] that he had a fracture of the foot. Is wearing brace on the lower extremity on the left at this time for treatment of fracture of the metatarsal. We will continue medications as prescribed. Patient stated medications are controlling pain rather well. Noted patient's blood pressure below. Patient stated that his blood pressure was.Wilburn Mylar when he was at Dr. Selina [name] office with blood pressure reading 130/80. We discussed patient's condition informed patient that he should follow up with Bryan [name] regarding his blood pressure and that we will remain available to consider modification of treatment regimen as needed. At the present time we will continue patient's hydrocodone acetaminophen and fentanyl patch and we'll avoid interventional treatment. The patient was in agreement status treatment plan.    Review of Systems     Objective:   Physical Exam  There was tenderness to palpate of the splenius capitis and occipitalis musculature region. Palpation of which reproduced mild discomfort. There was mild tends of the acromioclavicular and glenohumeral joint regions. There appeared to be unremarkable Spurling's maneuver. Palpation over the region cervical facet cervical paraspinal musculature region reproduced mild discomfort. There was mild to moderate tenderness over the thoracic facet thoracic paraspinal musculature region. No crepitus of the thoracic region was noted. Patient wasn't without definite increased pain with Tinel and Phalen's maneuver. Grip strength was decreased. Subluxation of the  digits of the hand were noted. No increased warmth and erythema of the extremities were noted. Palpation over the lumbar paraspinal muscle lumbar facet region reproduces moderate discomfort. Lateral bending and rotation and extension and palpation of the lumbar facets reproduce moderate discomfort. The leg raising limited to approximately 20. There was tends to palpation of the gluteal and piriformis muscular region of mild to moderate degree. There was mild to moderate tenderness over the PSIS and PII S region.Patient with brace of the left lower extremity. (Patient with fractured metatarsal) . There was negative Homans. Abdomen nontender and no costovertebral maintenance noted.    Assessment & Plan:    Degenerative disc disease lumbar spine Asymmetric bulging L5-S1 severe right foraminal stenosis probable right L5 nerve root encroachment, left foraminal stenosis, annular fissure L5-S1, asymmetric left foraminal stenosis at L3-4 with indeterminate left renal lesion likely a hemorrhagic cyst. A small papillary tumor could have a similar appearance.  Lumbar facet syndrome  Lumbar stenosis with neurogenic claudication  Rheumatoid arthritis  Sacroiliac joint dysfunction  Degenerative disc disease cervical spine     PLAN   Continue present medication hydrocodone acetaminophen and fentanyl patch  F/U PCP Bryan [name] for evaliation of  BP and general medical  condition. Blood pressure noted below on today's visit. Patient totally asymptomatic and without evidence of orthostatic changes. Patient stated blood pressure was totally normal the day before when patient was seen at Dr. Selina [name] office with blood pressure 130/80  F/U surgical evaluation as planned  F/U neurological evaluation. May consider pending follow-up evaluations  F/U rheumatological evaluation  May consider radiofrequency rhizolysis or intraspinal procedures pending response to present treatment and F/U evaluation    Patient to call [facility] should patient have concerns  prior to scheduled return appointment.

## 2015-01-30 NOTE — Patient Instructions (Signed)
PLAN   Continue present medicationHydrocodone acetaminophen and fentanyl patch  F/U PCP for evaliation of  BP . Please see C Doss today regarding blood pressure which is low and for evaluation of general medical condition  F/U surgical evaluation. May consider pending follow-up evaluations  F/U neurological evaluation. May consider pending follow-up evaluations  May consider radiofrequency rhizolysis or intraspinal procedures pending response to present treatment and F/U evaluation   Patient to call [facility] should patient have concerns prior to scheduled return appointment.

## 2015-02-03 DIAGNOSIS — M0579 Rheumatoid arthritis with rheumatoid factor of multiple sites without organ or systems involvement: Secondary | ICD-10-CM | POA: Diagnosis not present

## 2015-02-03 DIAGNOSIS — N189 Chronic kidney disease, unspecified: Secondary | ICD-10-CM | POA: Diagnosis not present

## 2015-02-03 DIAGNOSIS — G8929 Other chronic pain: Secondary | ICD-10-CM | POA: Diagnosis not present

## 2015-02-03 DIAGNOSIS — M25511 Pain in right shoulder: Secondary | ICD-10-CM | POA: Diagnosis not present

## 2015-02-03 DIAGNOSIS — D709 Neutropenia, unspecified: Secondary | ICD-10-CM | POA: Diagnosis not present

## 2015-02-04 DIAGNOSIS — R6889 Other general symptoms and signs: Secondary | ICD-10-CM | POA: Diagnosis not present

## 2015-02-04 DIAGNOSIS — R413 Other amnesia: Secondary | ICD-10-CM | POA: Diagnosis not present

## 2015-02-11 ENCOUNTER — Other Ambulatory Visit: Payer: Medicare Other

## 2015-02-11 DIAGNOSIS — M069 Rheumatoid arthritis, unspecified: Secondary | ICD-10-CM | POA: Diagnosis not present

## 2015-02-11 DIAGNOSIS — M05772 Rheumatoid arthritis with rheumatoid factor of left ankle and foot without organ or systems involvement: Secondary | ICD-10-CM | POA: Diagnosis not present

## 2015-02-11 DIAGNOSIS — M05771 Rheumatoid arthritis with rheumatoid factor of right ankle and foot without organ or systems involvement: Secondary | ICD-10-CM | POA: Diagnosis not present

## 2015-02-11 DIAGNOSIS — M1460 Charcot's joint, unspecified site: Secondary | ICD-10-CM | POA: Diagnosis not present

## 2015-02-13 ENCOUNTER — Other Ambulatory Visit: Payer: Self-pay | Admitting: Cardiovascular Disease

## 2015-02-17 ENCOUNTER — Ambulatory Visit (INDEPENDENT_AMBULATORY_CARE_PROVIDER_SITE_OTHER): Payer: Medicare Other | Admitting: Urology

## 2015-02-17 ENCOUNTER — Encounter: Payer: Self-pay | Admitting: Urology

## 2015-02-17 VITALS — BP 157/74 | HR 63 | Ht 73.0 in | Wt 196.1 lb

## 2015-02-17 DIAGNOSIS — N401 Enlarged prostate with lower urinary tract symptoms: Secondary | ICD-10-CM | POA: Diagnosis not present

## 2015-02-17 DIAGNOSIS — E291 Testicular hypofunction: Secondary | ICD-10-CM | POA: Diagnosis not present

## 2015-02-17 DIAGNOSIS — N138 Other obstructive and reflux uropathy: Secondary | ICD-10-CM

## 2015-02-17 DIAGNOSIS — I2510 Atherosclerotic heart disease of native coronary artery without angina pectoris: Secondary | ICD-10-CM | POA: Diagnosis not present

## 2015-02-17 DIAGNOSIS — N189 Chronic kidney disease, unspecified: Secondary | ICD-10-CM | POA: Insufficient documentation

## 2015-02-17 DIAGNOSIS — N400 Enlarged prostate without lower urinary tract symptoms: Secondary | ICD-10-CM | POA: Diagnosis not present

## 2015-02-17 DIAGNOSIS — G609 Hereditary and idiopathic neuropathy, unspecified: Secondary | ICD-10-CM | POA: Insufficient documentation

## 2015-02-17 NOTE — Progress Notes (Signed)
02/17/2015 4:55 PM   Bryan [name] Dec 12, 1940 [id number]  Referring provider: Rubbie Battiest, NP [address] [place],  [address]  Chief Complaint  Patient presents with  . Hypogonadism    f/u visit    HPI: Patient is a 74 year old white male with hypogonadism and BPH with LUTS who presents today for a 6 months follow up.    BPH WITH LUTS His IPSS score today is 10, which is moderate lower urinary tract symptomatology. He is mostly satisfied with his quality life due to his urinary symptoms.   His previous IPSS score was 19/4.  His previous PVR is 28 mL.  He denies any dysuria, hematuria or suprapubic pain.  His has had laser bladder outlet procedure in 05/05/2013.  He also denies any recent fevers, chills, nausea or vomiting.   He does not have a family history of PCa.      IPSS      02/17/15 0900       International Prostate Symptom Score   How often have you had the sensation of not emptying your bladder? Less than half the time     How often have you had to urinate less than every two hours? Less than half the time     How often have you found you stopped and started again several times when you urinated? Less than 1 in 5 times     How often have you found it difficult to postpone urination? Less than 1 in 5 times     How often have you had a weak urinary stream? Less than 1 in 5 times     How often have you had to strain to start urination? Less than 1 in 5 times     How many times did you typically get up at night to urinate? 2 Times     Total IPSS Score 10     Quality of Life due to urinary symptoms   If you were to spend the rest of your life with your urinary condition just the way it is now how would you feel about that? Mostly Satisfied        Score:  1-7 Mild 8-19 Moderate 20-35 Severe  Hypogonadism Patient presented to [place] with the symptoms of reduced libido and erectile dysfunction.  He is also experiencing a reduced  incidence of spontaneous erections, a decrease in physical and work performance, decreased energy and motivation, a decrease in cognitive function and mood changes.   This is indicated by his responses to the ADAM questionnaire.      Androgen Deficiency in the Aging Male      02/17/15 0900       Androgen Deficiency in the Aging Male   Do you have a decrease in libido (sex drive) Yes     Do you have lack of energy Yes     Do you have a decrease in strength and/or endurance Yes     Have you lost height No     Have you noticed a decreased "enjoyment of life" No     Are you sad and/or grumpy Yes     Are your erections less strong Yes     Have you noticed a recent deterioration in your ability to play sports Yes     Are you falling asleep after dinner No     Has there been a recent deterioration in your work performance Yes  His pretreatment testosterone level was 26 ng/dL on 01/17/2014.  He is currently managing his hypogonadism with Testopel insertion.  His last insertion was on 09/24/2014.  PMH: Past Medical History  Diagnosis Date  . Coronary artery disease   . Hyperlipidemia   . H/O thoracic aortic aneurysm repair   . Valvular heart disease   . Paroxysmal a-fib   . Hypertension   . Arrhythmia   . Glaucoma   . Arthritis   . Depression   . Allergy   . History of blood transfusion   . History of chicken pox   . Chronic back pain     Surgical History: Past Surgical History  Procedure Laterality Date  . Thoracic aortic aneurysm repair  2010  . Aortic valve replacement    . Cholecystectomy  2010  . Appendectomy  2010    Home Medications:    Medication List       This list is accurate as of: 02/17/15  4:55 PM.  Always use your most recent med list.               amoxicillin 500 MG capsule  Commonly known as:  AMOXIL  Take 4 capsules (2,000 mg total) by mouth once. Take 4 caps 1 hour prior to dental appointment.     aspirin 325 MG tablet  Take 325 mg  by mouth daily.     brimonidine 0.2 % ophthalmic solution  Commonly known as:  ALPHAGAN  Place 1 drop into both eyes daily.     ALPHAGAN P 0.1 % Soln  Generic drug:  brimonidine     cetirizine 10 MG tablet  Commonly known as:  ZYRTEC  Take 10 mg by mouth daily.     docusate calcium 240 MG capsule  Commonly known as:  SURFAK  Take 240 mg by mouth as needed.     donepezil 10 MG tablet  Commonly known as:  ARICEPT  Take 10 mg by mouth at bedtime.     fenofibrate 160 MG tablet  Take 1 tablet (160 mg total) by mouth daily.     fentaNYL 25 MCG/HR patch  Commonly known as:  DURAGESIC - dosed mcg/hr  Apply  1 patch to skin every 3 days if tolerated     fentaNYL 25 MCG/HR patch  Commonly known as:  DURAGESIC - dosed mcg/hr  Apply  1 patch to skin every 3 days if tolerated     FLUoxetine 20 MG capsule  Commonly known as:  PROZAC  Take 1 capsule (20 mg total) by mouth daily.     folic acid 1 MG tablet  Commonly known as:  FOLVITE  Take 1 mg by mouth 2 (two) times daily.     gabapentin 800 MG tablet  Commonly known as:  NEURONTIN  Take 1 tablet (800 mg total) by mouth 3 (three) times daily as needed.     HYDROcodone-acetaminophen 5-325 MG per tablet  Commonly known as:  NORCO/VICODIN  Limit 1-3 tablets by mouth per day for breakthrough pain while wearing fentanyl patch if tolerated     HYDROcodone-acetaminophen 5-325 MG per tablet  Commonly known as:  NORCO/VICODIN  Limit  2-4 tablets by mouth per day for breakthrough pain while wearing fentanyl patch if tolerated     hydroxychloroquine 200 MG tablet  Commonly known as:  PLAQUENIL  Take 200 mg by mouth daily.     lisinopril 20 MG tablet  Commonly known as:  PRINIVIL,ZESTRIL  TAKE 1 TABLET BY MOUTH  DAILY     lisinopril-hydrochlorothiazide 20-25 MG per tablet  Commonly known as:  PRINZIDE,ZESTORETIC  TAKE ONE TABLET BY MOUTH DAILY     magnesium oxide 400 MG tablet  Commonly known as:  MAG-OX  Take by mouth.      predniSONE 5 MG tablet  Commonly known as:  DELTASONE  Take 5 mg by mouth as needed.     simvastatin 40 MG tablet  Commonly known as:  ZOCOR  TAKE 1 TABLET BY MOUTH DAILY     tamsulosin 0.4 MG Caps capsule  Commonly known as:  FLOMAX  TAKE 1 CAPSULE BY MOUTH EVERY DAY     vitamin B-12 1000 MCG tablet  Commonly known as:  CYANOCOBALAMIN  Take 1,000 mcg by mouth daily.     Vitamin D 400 UNITS capsule  Take 400 Units by mouth daily.        Allergies:  Allergies  Allergen Reactions  . Demerol [Meperidine]     hives  . Penicillins     Hives     Family History: Family History  Problem Relation Age of Onset  . Heart failure Mother   . Hypertension Mother   . Asthma Mother   . Heart attack Father 52    MI  . Hypertension Sister     Social History:  reports that he quit smoking about 33 years ago. His smoking use included Cigarettes. He has a 32 pack-year smoking history. He does not have any smokeless tobacco history on file. He reports that he drinks about 0.6 oz of alcohol per week. He reports that he does not use illicit drugs.  ROS: UROLOGY Frequent Urination?: No Hard to postpone urination?: No Burning/pain with urination?: No Get up at night to urinate?: No Leakage of urine?: No Urine stream starts and stops?: No Trouble starting stream?: No Do you have to strain to urinate?: No Blood in urine?: No Urinary tract infection?: No Sexually transmitted disease?: No Injury to kidneys or bladder?: No Painful intercourse?: No Weak stream?: No Erection problems?: Yes Penile pain?: No  Gastrointestinal Nausea?: No Vomiting?: No Indigestion/heartburn?: No Diarrhea?: No Constipation?: No  Constitutional Fever: No Night sweats?: No Weight loss?: No Fatigue?: Yes  Skin Skin rash/lesions?: No Itching?: No  Eyes Blurred vision?: No Double vision?: No  Ears/Nose/Throat Sore throat?: No Sinus problems?: No  Hematologic/Lymphatic Swollen glands?:  No Easy bruising?: Yes  Cardiovascular Leg swelling?: No Chest pain?: No  Respiratory Cough?: No Shortness of breath?: No  Endocrine Excessive thirst?: No  Musculoskeletal Back pain?: No Joint pain?: Yes  Neurological Headaches?: No Dizziness?: No  Psychologic Depression?: No Anxiety?: No  Physical Exam: BP 157/74 mmHg  Pulse 63  Ht 6\' 1"  (1.854 m)  Wt 196 lb 1.6 oz (88.95 kg)  BMI 25.88 kg/m2  GU: Patient with uncircumcised phallus. Foreskin easily retracted  Urethral meatus is patent.  No penile discharge. No penile lesions or rashes. Scrotum without lesions, cysts, rashes and/or edema.  Testicles are located scrotally bilaterally. No masses are appreciated in the testicles. Left and right epididymis are normal. Rectal: Patient with  normal sphincter tone. Perineum without scarring or rashes. No rectal masses are appreciated. Prostate is approximately 30 grams, no nodules are appreciated. Seminal vesicles are normal.   Laboratory Data: Lab Results  Component Value Date   WBC 3.2* 04/30/2014   HGB 13.8 04/30/2014   HCT 42.0 04/30/2014   MCV 95 04/30/2014   PLT 113* 04/30/2014    Lab Results  Component Value  Date   CREATININE 1.67* 04/30/2014    PSA history:  <0.1 ng/mL on 01/17/2014    0.2 ng/mL on 08/07/2014  Lab Results  Component Value Date   TESTOSTERONE 721 10/28/2014   Assessment & Plan:    1. Hypogonadism male:   Patient's hypogonadism is managed with Testopel insertion.  He is doing well with this therapy would like to continue.  He has filled out the Testopel reimbursement form today.  Patient will be out of town and would like his Testopel scheduled in late October.   - Testosterone - Hematocrit  2. BPH (benign prostatic hyperplasia) with LUTS:   Patient's IPSS score is 10/2.  He will continue his tamsulosin 0.4 mg daily. He did not need a refill at this time.   He will follow up in 6 months for a PSA, DRE and an IPSS.    -  PSA   Return for Pending labs and Testopel reimbursement .  Zara Council, [facility] [address] [place], [place] [address] [phone]

## 2015-02-18 ENCOUNTER — Telehealth: Payer: Self-pay

## 2015-02-18 LAB — PSA: Prostate Specific Ag, Serum: 0.3 ng/mL (ref 0.0–4.0)

## 2015-02-18 LAB — TESTOSTERONE: Testosterone: 329 ng/dL — ABNORMAL LOW (ref 348–1197)

## 2015-02-18 LAB — HEMATOCRIT: Hematocrit: 35.9 % — ABNORMAL LOW (ref 37.5–51.0)

## 2015-02-18 NOTE — Telephone Encounter (Signed)
LMOM-labs are normal and waiting on testopel reimbursement.

## 2015-02-18 NOTE — Telephone Encounter (Signed)
-----   Message from Nori Riis, PA-C sent at 02/18/2015 11:25 AM EDT ----- His lab work is good.  We are still waiting for his Testopel reimbursement.

## 2015-02-19 ENCOUNTER — Telehealth: Payer: Self-pay | Admitting: *Deleted

## 2015-02-19 NOTE — Telephone Encounter (Signed)
Spoke to patient and gave him his next Testopel appointment. October 28th at 11:15 am. Scheduled by Leodis Sias. Testopel approved. Patient ok with plan.

## 2015-02-21 DIAGNOSIS — H26493 Other secondary cataract, bilateral: Secondary | ICD-10-CM | POA: Diagnosis not present

## 2015-02-21 DIAGNOSIS — H40023 Open angle with borderline findings, high risk, bilateral: Secondary | ICD-10-CM | POA: Diagnosis not present

## 2015-02-25 ENCOUNTER — Telehealth: Payer: Self-pay

## 2015-02-25 MED ORDER — FLUOXETINE HCL 20 MG PO CAPS: 20.0000 mg | ORAL_CAPSULE | Freq: Every day | ORAL | Status: DC

## 2015-02-25 NOTE — Telephone Encounter (Signed)
rx filled

## 2015-02-27 ENCOUNTER — Encounter: Payer: Self-pay | Admitting: Pain Medicine

## 2015-02-27 ENCOUNTER — Ambulatory Visit: Payer: Medicare Other | Attending: Pain Medicine | Admitting: Pain Medicine

## 2015-02-27 VITALS — BP 118/62 | HR 60 | Temp 98.4°F | Resp 16 | Ht 73.0 in | Wt 196.0 lb

## 2015-02-27 DIAGNOSIS — M4806 Spinal stenosis, lumbar region: Secondary | ICD-10-CM | POA: Insufficient documentation

## 2015-02-27 DIAGNOSIS — M48061 Spinal stenosis, lumbar region without neurogenic claudication: Secondary | ICD-10-CM

## 2015-02-27 DIAGNOSIS — M069 Rheumatoid arthritis, unspecified: Secondary | ICD-10-CM | POA: Insufficient documentation

## 2015-02-27 DIAGNOSIS — M47817 Spondylosis without myelopathy or radiculopathy, lumbosacral region: Secondary | ICD-10-CM | POA: Diagnosis not present

## 2015-02-27 DIAGNOSIS — M5136 Other intervertebral disc degeneration, lumbar region: Secondary | ICD-10-CM | POA: Insufficient documentation

## 2015-02-27 DIAGNOSIS — M170 Bilateral primary osteoarthritis of knee: Secondary | ICD-10-CM | POA: Diagnosis not present

## 2015-02-27 DIAGNOSIS — M533 Sacrococcygeal disorders, not elsewhere classified: Secondary | ICD-10-CM | POA: Insufficient documentation

## 2015-02-27 DIAGNOSIS — M79604 Pain in right leg: Secondary | ICD-10-CM | POA: Diagnosis present

## 2015-02-27 DIAGNOSIS — M5416 Radiculopathy, lumbar region: Secondary | ICD-10-CM

## 2015-02-27 DIAGNOSIS — M47812 Spondylosis without myelopathy or radiculopathy, cervical region: Secondary | ICD-10-CM

## 2015-02-27 DIAGNOSIS — M545 Low back pain: Secondary | ICD-10-CM | POA: Diagnosis present

## 2015-02-27 DIAGNOSIS — M79605 Pain in left leg: Secondary | ICD-10-CM | POA: Diagnosis present

## 2015-02-27 DIAGNOSIS — M47816 Spondylosis without myelopathy or radiculopathy, lumbar region: Secondary | ICD-10-CM

## 2015-02-27 DIAGNOSIS — M4726 Other spondylosis with radiculopathy, lumbar region: Secondary | ICD-10-CM

## 2015-02-27 MED ORDER — FENTANYL 25 MCG/HR TD PT72: MEDICATED_PATCH | TRANSDERMAL | Status: DC

## 2015-02-27 MED ORDER — HYDROCODONE-ACETAMINOPHEN 5-325 MG PO TABS: ORAL_TABLET | ORAL | Status: DC

## 2015-02-27 NOTE — Patient Instructions (Addendum)
PLAN   Continue present  medication hydrocodone acetaminophen and fentanyl patch . I will also prescribe Neurontin today for you as you have been taking provided I can obtain permission from your physician who has previously prescribed Neurontin for you. Please ask nurse if she can call your physician to assess permission for me to prescribed Neurontin for you  F/U PCP  C Doss for evaliation of  BP and general medical  condition  F/U surgical evaluation. May consider pending follow-up evaluations  F/U neurological evaluation. May consider pending follow-up evaluations  F/U Dr. [name]  F/U rheumatological evaluation  May consider radiofrequency rhizolysis or intraspinal procedures pending response to present treatment and F/U evaluation   [facility] to call [facility] should patient have concerns prior to scheduled return appointment.

## 2015-02-27 NOTE — Progress Notes (Signed)
   Subjective:    Patient ID: Bryan [name], male    DOB: 04-10-1941, 74 y.o.   MRN: [id number]  HPI  Patient is 74 year old gentleman returns a [facility] for further evaluation and treatment of pain involving the region of the lower back lower extremity regions neck and shoulders and upper back region as well patient states that he has some swelling of the foot which may be related to a stress fracture. Patient denies any other changes in condition and states that he is tolerating Neurontin hydrocodone acetaminophen and wearing fentanyl patch without any undesirable side effects. We will continue with the present medications and remain available to consider modification of treatment as needed. The patient was with understanding and in agreement with the treatment plan      Review of Systems     Objective:   Physical Exam  there was tenderness of the splenius capitis and occipitalis musculature regions of mild degree. There was mild tenderness to palpation over the region of the acromioclavicular glenohumeral joint regions. Palpation over the trapezius levator scapula rhomboid musculature region reproduces moderate discomfort. Patient appeared to be with decreased grip strength. There was subluxation of the digits of the hands without increased warmth erythema noted. There was mild tends to palpation of the epicondyles of the elbows both medial and lateral epicondyles. No increased warmth or erythema of the upper extremities noted. Tinel and Phalen's maneuver associated with moderately severe discomfort. Outpatient over the region of the mid thoracic and lower thoracic region was with evidence of muscle spasms without crepitus of the thoracic region noted. Palpation over the lumbar paraspinal muscles lumbar facet region associated with moderate discomfort. Lateral bending and rotation and extension palpation of the lumbar facets reproduce moderate discomfort. There was moderate  tenderness of the PSIS PII S region gluteal and piriformis musculature regions as well as the greater trochanteric region and iliotibial band region. Straight leg raising was tolerates approximately 20 with no definite sensory deficit of dermatomal distribution detected. There was negative Homans. Abdomen was nontender with no costovertebral angle tenderness noted.      Assessment & Plan:  Degenerative disc disease lumbar spine Asymmetric bulging L5-S1 severe right foraminal stenosis probable right L5 nerve root encroachment, left foraminal stenosis, annular fissure L5-S1, asymmetric left foraminal stenosis at L3-4 with indeterminate left renal lesion likely a hemorrhagic cyst. A small papillary tumor could have a similar appearance.  Lumbar facet syndrome  Lumbar stenosis with neurogenic claudication  Rheumatoid arthritis  Sacroiliac joint dysfunction    PLAN   Continue present medication  hydrocodone acetaminophen and fentanyl patch. We also informed patient we will prescribe Neurontin as was been prescribed if his primary care physician would allow [place] to do such  F/U PCP C Doss for evaliation of blood pressure and general medical condition  F/U surgical evaluation as planned  F/U neurological evaluation. May consider pending follow-up evaluations  May consider radiofrequency rhizolysis or intraspinal procedures pending response to present treatment and F/U evaluation   Patient to call [facility] should patient have concerns prior to scheduled return appointment.

## 2015-02-27 NOTE — Progress Notes (Signed)
Safety precautions to be maintained throughout the outpatient stay will include: orient to surroundings, keep bed in low position, maintain call bell within reach at all times, provide assistance with transfer out of bed and ambulation.  

## 2015-03-10 ENCOUNTER — Other Ambulatory Visit: Payer: Self-pay | Admitting: Cardiovascular Disease

## 2015-03-18 ENCOUNTER — Other Ambulatory Visit: Payer: Self-pay | Admitting: Pain Medicine

## 2015-03-21 ENCOUNTER — Encounter: Payer: Self-pay | Admitting: Urology

## 2015-03-21 ENCOUNTER — Ambulatory Visit (INDEPENDENT_AMBULATORY_CARE_PROVIDER_SITE_OTHER): Payer: Medicare Other | Admitting: Urology

## 2015-03-21 VITALS — BP 132/70 | HR 57 | Ht 73.0 in | Wt 198.4 lb

## 2015-03-21 DIAGNOSIS — E291 Testicular hypofunction: Secondary | ICD-10-CM | POA: Diagnosis not present

## 2015-03-21 MED ORDER — TESTOSTERONE 75 MG IL PLLT
75.0000 mg | PELLET | Freq: Once | Status: AC
  Administered 2015-03-21: 75 mg

## 2015-03-21 NOTE — Progress Notes (Signed)
Patient has been instructed to call office to fill out another Testopel form one month before next insertion to make sure of approval.

## 2015-03-21 NOTE — Progress Notes (Signed)
This is a 74 -year-old male with hypogonadism and he is managed with Testopel. He presents today for Testopel insertion.  Patient is placed on the exam table in the right lateral jackknife position.  Identified upper outer quadrant of hip for insertion; prepped area with Betadine and injected 10 cc's of Lidocaine 1% with Epinephrine to anesthetize superficially and distally along trocar tract.  Made 3 mm incision using 15 blade of scalpel; trocar with sharp ended stylet was inserted into subcutaneous tissue in line with femur. Sharp stylet was withdrawn and 6 pellets were placed into trocar well. Testopel pellets advanced into tissue using blunt ended stylet. Trocar removed and incision closed using 6 Steri-Strips. Cleansed area to remove Betadine and covered Steri-Strips with outer Band-Aid.  Careful inspection of insertion is done and patient informed of post procedure instructions.  He will return in three month for serum testosterone before 9:00am HCT, PSA, exam and to fill out Testopel forms.

## 2015-04-01 ENCOUNTER — Ambulatory Visit: Payer: Medicare Other | Attending: Pain Medicine | Admitting: Pain Medicine

## 2015-04-01 ENCOUNTER — Encounter: Payer: Self-pay | Admitting: Pain Medicine

## 2015-04-01 VITALS — BP 146/66 | HR 64 | Temp 97.0°F | Resp 16 | Ht 73.0 in | Wt 190.0 lb

## 2015-04-01 DIAGNOSIS — M542 Cervicalgia: Secondary | ICD-10-CM | POA: Diagnosis present

## 2015-04-01 DIAGNOSIS — M069 Rheumatoid arthritis, unspecified: Secondary | ICD-10-CM | POA: Diagnosis not present

## 2015-04-01 DIAGNOSIS — M546 Pain in thoracic spine: Secondary | ICD-10-CM | POA: Diagnosis present

## 2015-04-01 DIAGNOSIS — M47812 Spondylosis without myelopathy or radiculopathy, cervical region: Secondary | ICD-10-CM

## 2015-04-01 DIAGNOSIS — M170 Bilateral primary osteoarthritis of knee: Secondary | ICD-10-CM | POA: Diagnosis not present

## 2015-04-01 DIAGNOSIS — M5136 Other intervertebral disc degeneration, lumbar region: Secondary | ICD-10-CM | POA: Diagnosis not present

## 2015-04-01 DIAGNOSIS — M533 Sacrococcygeal disorders, not elsewhere classified: Secondary | ICD-10-CM | POA: Insufficient documentation

## 2015-04-01 DIAGNOSIS — M4806 Spinal stenosis, lumbar region: Secondary | ICD-10-CM | POA: Diagnosis not present

## 2015-04-01 DIAGNOSIS — M5416 Radiculopathy, lumbar region: Secondary | ICD-10-CM

## 2015-04-01 DIAGNOSIS — M5126 Other intervertebral disc displacement, lumbar region: Secondary | ICD-10-CM | POA: Diagnosis not present

## 2015-04-01 DIAGNOSIS — M47817 Spondylosis without myelopathy or radiculopathy, lumbosacral region: Secondary | ICD-10-CM | POA: Diagnosis not present

## 2015-04-01 DIAGNOSIS — M48061 Spinal stenosis, lumbar region without neurogenic claudication: Secondary | ICD-10-CM

## 2015-04-01 DIAGNOSIS — M47816 Spondylosis without myelopathy or radiculopathy, lumbar region: Secondary | ICD-10-CM

## 2015-04-01 DIAGNOSIS — M4726 Other spondylosis with radiculopathy, lumbar region: Secondary | ICD-10-CM

## 2015-04-01 MED ORDER — FENTANYL 25 MCG/HR TD PT72: MEDICATED_PATCH | TRANSDERMAL | Status: DC

## 2015-04-01 MED ORDER — HYDROCODONE-ACETAMINOPHEN 5-325 MG PO TABS: ORAL_TABLET | ORAL | Status: DC

## 2015-04-01 NOTE — Progress Notes (Signed)
Safety precautions to be maintained throughout the outpatient stay will include: orient to surroundings, keep bed in low position, maintain call bell within reach at all times, provide assistance with transfer out of bed and ambulation.  

## 2015-04-01 NOTE — Patient Instructions (Signed)
PLAN   Continue present  Medication Neurontin  hydrocodone acetaminophen and fentanyl patch  F/U PCP  C Doss for evaliation of  BP and general medical  condition  F/U surgical evaluation. May consider pending follow-up evaluations  F/U neurological evaluation. May consider pending follow-up evaluations  F/U Dr. [name]  F/U rheumatological evaluation  May consider radiofrequency rhizolysis or intraspinal procedures pending response to present treatment and F/U evaluation   Patient to call Pain Management Center should patient have concerns prior to scheduled return appointment.

## 2015-04-01 NOTE — Progress Notes (Signed)
   Subjective:    Patient ID: Bryan [name], male    DOB: [birth year], 74 y.o.   MRN: [id number]  HPI  The patient is a 74 year old gentleman who returns to pain management for further evaluation and treatment of pain involving the neck into back upper and lower stay regions. Patient denies any trauma or change in events of daily living the call significant change in symptomatology. The patient states that his pain seems to have increased after his [place]. The patient denies any other trauma change in events a day later because no change in symptomatology. We'll continue patient's medications consisting Neurontin hydrocodone acetaminophen and fentanyl patch and remain available to consider modifications of treatment pending response to treatment and follow-up evaluation.   Review of Systems     Objective:   Physical Exam  There was moderate to moderately severe tenderness to palpation of the splenius capitis and a separate talus musculature regions. There was moderate tenderness to palpation of the acromioclavicular and glenohumeral joint regions. Patient appeared to be slightly decreased grip strength and Tinel and Phalen's maneuver without increase of pain of significant degree. There was deformity of the digits noted without increased warmth and erythema in the region of the digits. Palpation over the thoracic facet thoracic paraspinal muscles reproduces moderate discomfort. There was moderate to palpation of the lumbar paraspinal muscles lumbar facet region. Palpation of the PSIS and PIS regions reproduce mild discomfort. Leg raising was tolerates approximately 20 without an increase of pain with dorsiflexion noted. No definite sensory deficit or dermatomal distribution was detected. There was mild tenderness to palpation of the greater trochanteric region and iliotibial band region. These tenderness to palpation with negative anterior and posterior drawer signs without ballottement  of the patella. There was no increased warmth and erythema in the region of the knees. The knees were over significant crepitus. No increased joint laxity was noted. There was negative clonus negative Homans. No sensory deficit or dermatomal distribution of the lower extremities noted. Abdomen was protuberant without excessive tends to palpation and no costovertebral tenderness was noted.      Assessment & Plan:     Degenerative disc disease lumbar spine Asymmetric bulging L5-S1 severe right foraminal stenosis probable right L5 nerve root encroachment, left foraminal stenosis, annular fissure L5-S1, asymmetric left foraminal stenosis at L3-4 with indeterminate left renal lesion likely a hemorrhagic cyst. A small papillary tumor could have a similar appearance.  Lumbar facet syndrome  Lumbar stenosis with neurogenic claudication  Rheumatoid arthritis  Sacroiliac joint dysfunction     PLAN    Continue present medication  hydrocodone acetaminophen Neurontin and fentanyl patch. We also informed patient we will prescribe Neurontin as was been prescribed if his primary care physician would allow [place] to do such  F/U PCP C Doss for evaliation of blood pressure and general medical condition  F/U surgical evaluation as planned  F/U neurological evaluation. May consider pending follow-up evaluations  May consider radiofrequency rhizolysis or intraspinal procedures pending response to present treatment and F/U evaluation   Patient to call Pain Management Center should patient have concerns prior to scheduled return appointment.

## 2015-04-03 ENCOUNTER — Other Ambulatory Visit: Payer: Self-pay

## 2015-04-03 MED ORDER — FLUOXETINE HCL 20 MG PO CAPS: 20.0000 mg | ORAL_CAPSULE | Freq: Every day | ORAL | Status: DC

## 2015-04-15 DIAGNOSIS — G479 Sleep disorder, unspecified: Secondary | ICD-10-CM | POA: Diagnosis not present

## 2015-04-15 DIAGNOSIS — R413 Other amnesia: Secondary | ICD-10-CM | POA: Diagnosis not present

## 2015-04-15 DIAGNOSIS — R4182 Altered mental status, unspecified: Secondary | ICD-10-CM | POA: Diagnosis not present

## 2015-04-15 DIAGNOSIS — R6889 Other general symptoms and signs: Secondary | ICD-10-CM | POA: Diagnosis not present

## 2015-04-29 ENCOUNTER — Encounter: Payer: Self-pay | Admitting: Pain Medicine

## 2015-04-29 ENCOUNTER — Ambulatory Visit: Payer: Medicare Other | Attending: Pain Medicine | Admitting: Pain Medicine

## 2015-04-29 VITALS — BP 139/61 | HR 50 | Temp 97.5°F | Resp 16 | Ht 72.0 in | Wt 195.0 lb

## 2015-04-29 DIAGNOSIS — Z96659 Presence of unspecified artificial knee joint: Secondary | ICD-10-CM | POA: Diagnosis not present

## 2015-04-29 DIAGNOSIS — M1711 Unilateral primary osteoarthritis, right knee: Secondary | ICD-10-CM | POA: Insufficient documentation

## 2015-04-29 DIAGNOSIS — M069 Rheumatoid arthritis, unspecified: Secondary | ICD-10-CM | POA: Insufficient documentation

## 2015-04-29 DIAGNOSIS — M47812 Spondylosis without myelopathy or radiculopathy, cervical region: Secondary | ICD-10-CM

## 2015-04-29 DIAGNOSIS — Z96652 Presence of left artificial knee joint: Secondary | ICD-10-CM | POA: Diagnosis not present

## 2015-04-29 DIAGNOSIS — M47817 Spondylosis without myelopathy or radiculopathy, lumbosacral region: Secondary | ICD-10-CM | POA: Diagnosis not present

## 2015-04-29 DIAGNOSIS — M5136 Other intervertebral disc degeneration, lumbar region: Secondary | ICD-10-CM | POA: Insufficient documentation

## 2015-04-29 DIAGNOSIS — M5416 Radiculopathy, lumbar region: Secondary | ICD-10-CM

## 2015-04-29 DIAGNOSIS — M4726 Other spondylosis with radiculopathy, lumbar region: Secondary | ICD-10-CM

## 2015-04-29 DIAGNOSIS — M170 Bilateral primary osteoarthritis of knee: Secondary | ICD-10-CM | POA: Diagnosis not present

## 2015-04-29 DIAGNOSIS — M4806 Spinal stenosis, lumbar region: Secondary | ICD-10-CM | POA: Insufficient documentation

## 2015-04-29 DIAGNOSIS — M47816 Spondylosis without myelopathy or radiculopathy, lumbar region: Secondary | ICD-10-CM

## 2015-04-29 DIAGNOSIS — M5126 Other intervertebral disc displacement, lumbar region: Secondary | ICD-10-CM | POA: Diagnosis not present

## 2015-04-29 DIAGNOSIS — M48061 Spinal stenosis, lumbar region without neurogenic claudication: Secondary | ICD-10-CM

## 2015-04-29 DIAGNOSIS — M533 Sacrococcygeal disorders, not elsewhere classified: Secondary | ICD-10-CM | POA: Diagnosis not present

## 2015-04-29 DIAGNOSIS — M545 Low back pain: Secondary | ICD-10-CM | POA: Diagnosis present

## 2015-04-29 MED ORDER — FENTANYL 25 MCG/HR TD PT72: MEDICATED_PATCH | TRANSDERMAL | Status: DC

## 2015-04-29 MED ORDER — HYDROCODONE-ACETAMINOPHEN 5-325 MG PO TABS: ORAL_TABLET | ORAL | Status: DC

## 2015-04-29 NOTE — Progress Notes (Signed)
Subjective:    Patient ID: Bryan [name], male    DOB: August 09, 1940, 74 y.o.   MRN: [id number]  HPI  Patient is 74 year old gentleman who returns to pain management Center for further evaluation and treatment of pain involving the lumbar lower extremity region as well as multiple joints. Patient is with history of rheumatoid arthritis with multiple arthralgias as well as myalgias. Patient also with complaint of prior total knee replacement with some discomfort involving the knees. We discussed considering patient for geniculate nerve blocks of the knees. At the present time we will continue Neurontin and fentanyl patches with hydrocodone acetaminophen for breakthrough pain. Patient will call pain management should he wish to consider interventional treatment of pain involving the knees or have other concerns regarding condition prior to scheduled return appointment patient the patient agrees suggested treatment plan.     Review of Systems     Objective:   Physical Exam There was tenderness to palpation of the cervical facet cervical paraspinal musculature region. Palpation over the region of the splenius capitis and occipitalis regions reproduces minimal discomfort. Patient was a subluxation of the digits of the hand without increased warmth and erythema in the region of the hands or of the joints. There was tends to palpation of the medial and lateral epicondyles of the upper extremities on the left as well as on the right without increased warmth and erythema noted in the region of the elbows. Tinel and Phalen's maneuver were without increased pain of moderate degree. Patient over the region of the thoracic facet thoracic paraspinal must reason was with mild tends to palpation evidence of mild muscle spasm with palpation over the lumbar paraspinal must reason lumbar facet region associated with mild to moderate discomfort. Lateral bending rotation extension and palpation of the lumbar facets  reproduce mild to moderate discomfort. There was mild tenderness to palpation over the gluteal and piriformis musculature region as well as the PSIS and PII S region. There was mild to moderate tenderness to palpation of the greater trochanteric region iliotibial band region. Knee was with well-healed surgical scar of the knee without increased warmth and erythema in the region of the knee. The right knee was attends to palpation with crepitus of the knee without increased warmth and erythema in the region of the knee there was negative anterior and posterior drawer signs without ballottement of the patella. There was negative clonus negative Homans. Abdomen nontender with no costovertebral tenderness noted.      Assessment & Plan:     Degenerative disc disease lumbar spine Asymmetric bulging L5-S1 severe right foraminal stenosis probable right L5 nerve root encroachment, left foraminal stenosis, annular fissure L5-S1, asymmetric left foraminal stenosis at L3-4 with indeterminate left renal lesion likely a hemorrhagic cyst. A small papillary tumor could have a similar appearance.  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 May consider pending follow-up evaluations Consider seeing surgeon for evaluation of knee as discussed. May consider geniculate nerve blocks of the knee  F/U neurological evaluation. May consider pending follow-up evaluations  F/U Dr. [name] as needed  F/U rheumatological evaluation  May consider radiofrequency  rhizolysis or intraspinal procedures pending response to present treatment and F/U  evaluation

## 2015-04-29 NOTE — Patient Instructions (Addendum)
PLAN   Continue present  Medication Neurontin  hydrocodone acetaminophen and fentanyl patch  F/U PCP  C Doss for evaliation of  BP and general medical  condition  F/U surgical evaluation. May consider pending follow-up evaluations Consider seeing surgeon for evaluation of knee as discussed. May consider geniculate nerve blocks of the knee  F/U neurological evaluation. May consider pending follow-up evaluations  F/U Dr. [name] as needed  F/U rheumatological evaluation  May consider radiofrequency rhizolysis or intraspinal procedures pending response to present treatment and F/U evaluation

## 2015-04-29 NOTE — Progress Notes (Signed)
Safety precautions to be maintained throughout the outpatient stay will include: orient to surroundings, keep bed in low position, maintain call bell within reach at all times, provide assistance with transfer out of bed and ambulation.  

## 2015-05-06 ENCOUNTER — Encounter: Payer: Self-pay | Admitting: Cardiovascular Disease

## 2015-05-06 ENCOUNTER — Ambulatory Visit (INDEPENDENT_AMBULATORY_CARE_PROVIDER_SITE_OTHER): Payer: Medicare Other | Admitting: Cardiovascular Disease

## 2015-05-06 VITALS — BP 170/84 | HR 57 | Ht 72.0 in | Wt 206.1 lb

## 2015-05-06 DIAGNOSIS — E785 Hyperlipidemia, unspecified: Secondary | ICD-10-CM | POA: Diagnosis not present

## 2015-05-06 DIAGNOSIS — I2510 Atherosclerotic heart disease of native coronary artery without angina pectoris: Secondary | ICD-10-CM | POA: Diagnosis not present

## 2015-05-06 DIAGNOSIS — R0989 Other specified symptoms and signs involving the circulatory and respiratory systems: Secondary | ICD-10-CM | POA: Diagnosis not present

## 2015-05-06 DIAGNOSIS — I7121 Aneurysm of the ascending aorta, without rupture: Secondary | ICD-10-CM

## 2015-05-06 DIAGNOSIS — I6523 Occlusion and stenosis of bilateral carotid arteries: Secondary | ICD-10-CM

## 2015-05-06 DIAGNOSIS — Z952 Presence of prosthetic heart valve: Secondary | ICD-10-CM

## 2015-05-06 DIAGNOSIS — I6529 Occlusion and stenosis of unspecified carotid artery: Secondary | ICD-10-CM | POA: Insufficient documentation

## 2015-05-06 DIAGNOSIS — I712 Thoracic aortic aneurysm, without rupture: Secondary | ICD-10-CM

## 2015-05-06 DIAGNOSIS — Z954 Presence of other heart-valve replacement: Secondary | ICD-10-CM

## 2015-05-06 DIAGNOSIS — I10 Essential (primary) hypertension: Secondary | ICD-10-CM

## 2015-05-06 NOTE — Patient Instructions (Addendum)
You are doing well. Blood pressure is elevated No medication changes were made.  We will order a carotid ultrasound for bruit   Date & time: __________________________  Please monitor your blood pressure daily, call the office with numbers  Please call us if you have new issues that need to be addressed before your next appt.  Your physician wants you to follow-up in: 6 months.  You will receive a reminder letter in the mail two months in advance. If you don't receive a letter, please call our office to schedule the follow-up appointment.

## 2015-05-06 NOTE — Assessment & Plan Note (Signed)
Blood pressure very elevated on today's visit. Unclear etiology He will monitor this daily and call us with numbers Prior office visits with other physicians it was well controlled. May need additional medications if this remains elevated

## 2015-05-06 NOTE — Assessment & Plan Note (Signed)
Encouraged him to stay on his simvastatin Carotid ultrasound pending for bruit, if notable disease will need to push LDL less than 70

## 2015-05-06 NOTE — Assessment & Plan Note (Signed)
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

## 2015-05-06 NOTE — Assessment & Plan Note (Signed)
Bruit noted bilaterally, carotid ultrasound ordered for further evaluation

## 2015-05-06 NOTE — Progress Notes (Signed)
Patient ID: Bryan [name], male    DOB: January 09, 1941, 74 y.o.   MRN: [id number]  HPI Comments: Bryan [name] is a pleasant 74 year old gentleman with a history of Ascending aorta aneurysm, bicuspid aortic valve noted in 2009 with cardiac catheterization at that time showing mild to moderate CAD, who underwent aVR with bioprosthetic valve, and ascending aorta grafting in 2010, history of hypertension, hyperlipidemia, mild chronic renal insufficiency, total knee replacement in 2004, appendix rupture and gallbladder surgery in 2010, who presents for routine follow-up of his bioprosthetic aortic valve. Remote history of postoperative atrial fibrillation and 2010 He is a retired Software engineer  In follow-up today, blood pressure is elevated. Reports he is been taking his medications in a consistent manner Review of prior office visits with numerous doctors shows blood pressure within a reasonable range Denies any pain, new stressors Some discomfort in his ankle, scheduled to see podiatry Possible effusion of the left ankle worse on ambulation Has not been checking blood pressure on a regular basis at home Otherwise denies any symptoms, active, recently did some mission work 3 months ago. Overdid it a little bit  Echocardiogram June 2016 discussed with him showing normal LV function, valve gradient consistent with bioprosthetic valve, moderately dilated ascending aorta, stable, 4.4 cm  EKG on today's visit shows normal sinus rhythm with rate 57 bpm, consider old anterior MI, left axis deviation  Other past medical history  Prior hemoglobin A1c 6.0 , cholesterol 150  Echocardiogram July 2015 showing mild to moderate aortic valve stenosis, 4.5 cm ascending aorta. No prior records available  Notes indicate some degree of carotid disease though details are not available.   Allergies  Allergen Reactions  . Demerol [Meperidine]     hives  . Penicillins     Hives     Outpatient Encounter  Prescriptions as of 05/06/2015  Medication Sig  . ALPHAGAN P 0.1 % SOLN Place 1 drop into both eyes daily.   Marland Kitchen amoxicillin (AMOXIL) 500 MG capsule Take 4 capsules (2,000 mg total) by mouth once. Take 4 caps 1 hour prior to dental appointment.  Marland Kitchen aspirin 325 MG tablet Take 325 mg by mouth daily.  . cetirizine (ZYRTEC) 10 MG tablet Take 10 mg by mouth daily.  . Cholecalciferol (VITAMIN D) 400 UNITS capsule Take 400 Units by mouth daily.  Marland Kitchen donepezil (ARICEPT) 10 MG tablet Take 10 mg by mouth at bedtime.  . fenofibrate 160 MG tablet TAKE ONE TABLET BY MOUTH DAILY  . fentaNYL (DURAGESIC - DOSED MCG/HR) 25 MCG/HR patch Apply  1 patch to skin every 3 days if tolerated  . FLUoxetine (PROZAC) 20 MG capsule Take 1 capsule (20 mg total) by mouth daily.  . folic acid (FOLVITE) 1 MG tablet Take 1 mg by mouth 2 (two) times daily.   Marland Kitchen gabapentin (NEURONTIN) 800 MG tablet Take 1 tablet (800 mg total) by mouth 3 (three) times daily as needed.  Marland Kitchen HYDROcodone-acetaminophen (NORCO/VICODIN) 5-325 MG tablet Limit  2-4 tablets by mouth per day for breakthrough pain while wearing fentanyl patch if tolerated  . hydroxychloroquine (PLAQUENIL) 200 MG tablet Take 200 mg by mouth daily.  Marland Kitchen lisinopril (PRINIVIL,ZESTRIL) 20 MG tablet TAKE 1 TABLET BY MOUTH DAILY  . lisinopril-hydrochlorothiazide (PRINZIDE,ZESTORETIC) 20-25 MG per tablet TAKE ONE TABLET BY MOUTH DAILY  . magnesium oxide (MAG-OX) 400 MG tablet Take by mouth.  . predniSONE (DELTASONE) 5 MG tablet Take 5 mg by mouth as needed. PRN for arthritis flair up  . simvastatin (ZOCOR) 40  MG tablet TAKE 1 TABLET BY MOUTH DAILY  . tamsulosin (FLOMAX) 0.4 MG CAPS capsule TAKE 1 CAPSULE BY MOUTH EVERY DAY  . traZODone (DESYREL) 50 MG tablet Take 50-100 mg by mouth at bedtime. For sleep  . vitamin B-12 (CYANOCOBALAMIN) 1000 MCG tablet Take 1,000 mcg by mouth daily.  . [DISCONTINUED] docusate calcium (SURFAK) 240 MG capsule Take 240 mg by mouth as needed.   . [DISCONTINUED]  fentaNYL (DURAGESIC - DOSED MCG/HR) 25 MCG/HR patch Apply  1 patch to skin every 3 days if tolerated   No facility-administered encounter medications on file as of 05/06/2015.    Past Medical History  Diagnosis Date  . Coronary artery disease   . Hyperlipidemia   . H/O thoracic aortic aneurysm repair   . Valvular heart disease   . Paroxysmal a-fib ([place])   . Hypertension   . Arrhythmia   . Glaucoma   . Arthritis   . Depression   . Allergy   . History of blood transfusion   . History of chicken pox   . Chronic back pain     Past Surgical History  Procedure Laterality Date  . Thoracic aortic aneurysm repair  2010  . Aortic valve replacement    . Cholecystectomy  2010  . Appendectomy  2010    Social History  reports that he quit smoking about 33 years ago. His smoking use included Cigarettes. He has a 32 pack-year smoking history. He does not have any smokeless tobacco history on file. He reports that he drinks about 0.6 oz of alcohol per week. He reports that he does not use illicit drugs.  Family History family history includes Asthma in his mother; Heart attack (age of onset: 48) in his father; Heart failure in his mother; Hypertension in his mother and sister.   Review of Systems  Constitutional: Negative.   Respiratory: Negative.   Cardiovascular: Negative.   Gastrointestinal: Negative.   Musculoskeletal: Positive for arthralgias.  Neurological: Negative.   Hematological: Negative.   Psychiatric/Behavioral: Negative.   All other systems reviewed and are negative.   BP 170/84 mmHg  Pulse 57  Ht 6' (1.829 m)  Wt 206 lb 1.9 oz (93.495 kg)  BMI 27.95 kg/m2  Physical Exam  Constitutional: He is oriented to person, place, and time. He appears well-developed and well-nourished.  HENT:  Head: Normocephalic.  Nose: Nose normal.  Mouth/Throat: Oropharynx is clear and moist.  Eyes: Conjunctivae are normal. Pupils are equal, round, and reactive to light.  Neck:  Normal range of motion. Neck supple. No JVD present. Carotid bruit is present.  Cardiovascular: Normal rate, regular rhythm, S1 normal, S2 normal and intact distal pulses.  Exam reveals no gallop and no friction rub.   Murmur heard.  Systolic murmur is present with a grade of 2/6  Pulmonary/Chest: Effort normal and breath sounds normal. No respiratory distress. He has no wheezes. He has no rales. He exhibits no tenderness.  Abdominal: Soft. Bowel sounds are normal. He exhibits no distension. There is no tenderness.  Musculoskeletal: Normal range of motion. He exhibits no edema or tenderness.  Lymphadenopathy:    He has no cervical adenopathy.  Neurological: He is alert and oriented to person, place, and time. Coordination normal.  Skin: Skin is warm and dry. No rash noted. No erythema.  Psychiatric: He has a normal mood and affect. His behavior is normal. Judgment and thought content normal.      Assessment and Plan   Nursing note and vitals reviewed.

## 2015-05-06 NOTE — Assessment & Plan Note (Signed)
Currently with no symptoms of angina. No further workup at this time. Continue current medication regimen. 

## 2015-05-06 NOTE — Assessment & Plan Note (Signed)
Stable lab prosthetic aortic valve. Repeat echocardiogram June 2017

## 2015-05-07 DIAGNOSIS — M069 Rheumatoid arthritis, unspecified: Secondary | ICD-10-CM | POA: Diagnosis not present

## 2015-05-07 DIAGNOSIS — M79672 Pain in left foot: Secondary | ICD-10-CM | POA: Diagnosis not present

## 2015-05-07 DIAGNOSIS — M1460 Charcot's joint, unspecified site: Secondary | ICD-10-CM | POA: Diagnosis not present

## 2015-05-08 DIAGNOSIS — M25511 Pain in right shoulder: Secondary | ICD-10-CM | POA: Diagnosis not present

## 2015-05-08 DIAGNOSIS — G8929 Other chronic pain: Secondary | ICD-10-CM | POA: Diagnosis not present

## 2015-05-09 ENCOUNTER — Ambulatory Visit (INDEPENDENT_AMBULATORY_CARE_PROVIDER_SITE_OTHER): Payer: Medicare Other | Admitting: Nurse Practitioner

## 2015-05-09 ENCOUNTER — Encounter: Payer: Self-pay | Admitting: Nurse Practitioner

## 2015-05-09 VITALS — BP 140/70 | HR 54 | Temp 97.7°F | Ht 72.0 in | Wt 207.1 lb

## 2015-05-09 DIAGNOSIS — D649 Anemia, unspecified: Secondary | ICD-10-CM

## 2015-05-09 DIAGNOSIS — I6523 Occlusion and stenosis of bilateral carotid arteries: Secondary | ICD-10-CM

## 2015-05-09 DIAGNOSIS — N401 Enlarged prostate with lower urinary tract symptoms: Secondary | ICD-10-CM

## 2015-05-09 DIAGNOSIS — E785 Hyperlipidemia, unspecified: Secondary | ICD-10-CM

## 2015-05-09 DIAGNOSIS — R4189 Other symptoms and signs involving cognitive functions and awareness: Secondary | ICD-10-CM | POA: Diagnosis not present

## 2015-05-09 DIAGNOSIS — Z23 Encounter for immunization: Secondary | ICD-10-CM

## 2015-05-09 DIAGNOSIS — N400 Enlarged prostate without lower urinary tract symptoms: Secondary | ICD-10-CM

## 2015-05-09 DIAGNOSIS — I10 Essential (primary) hypertension: Secondary | ICD-10-CM

## 2015-05-09 DIAGNOSIS — N138 Other obstructive and reflux uropathy: Secondary | ICD-10-CM

## 2015-05-09 MED ORDER — FLUOXETINE HCL 20 MG PO CAPS: 20.0000 mg | ORAL_CAPSULE | Freq: Every day | ORAL | Status: DC

## 2015-05-09 MED ORDER — TAMSULOSIN HCL 0.4 MG PO CAPS: 0.4000 mg | ORAL_CAPSULE | Freq: Every day | ORAL | Status: DC

## 2015-05-09 NOTE — Progress Notes (Signed)
Patient ID: Bryan [name], male    DOB: 1941/02/08  Age: 74 y.o. MRN: [id number]  CC: No chief complaint on file.   HPI Bryan [name] presents for CC of medication refills.    1) HTN- saw Dr. [name] on 05/06/15 with elevated BP. Today is improved. Saw Dr. [name] on Tuesday and [facility] yesterday.   2) Prozac- stable on 20 mg and would like to continue   3) Right knee bothering him- Saw Dr. Primus Bravo on 04/29/15. Feels pain could be cause of HTN. Steroids in feet and [facility] right shoulder yesterday. Taking care of Neurontin, fentanyl and hydrocodone.    History [place] has a past medical history of Coronary artery disease; Hyperlipidemia; H/O thoracic aortic aneurysm repair; Valvular heart disease; Paroxysmal a-fib ([place]); Hypertension; Arrhythmia; Glaucoma; Arthritis; Depression; Allergy; History of blood transfusion; History of chicken pox; and Chronic back pain.   He has past surgical history that includes Thoracic aortic aneurysm repair (2010); Aortic valve replacement; Cholecystectomy (2010); and Appendectomy (2010).   His family history includes Asthma in his mother; Heart attack (age of onset: 60) in his father; Heart failure in his mother; Hypertension in his mother and sister.He reports that he quit smoking about 33 years ago. His smoking use included Cigarettes. He has a 32 pack-year smoking history. He does not have any smokeless tobacco history on file. He reports that he drinks about 0.6 oz of alcohol per week. He reports that he does not use illicit drugs.  Outpatient Prescriptions Prior to Visit  Medication Sig Dispense Refill  . ALPHAGAN P 0.1 % SOLN Place 1 drop into both eyes daily.     Marland Kitchen aspirin 325 MG tablet Take 325 mg by mouth daily.    . cetirizine (ZYRTEC) 10 MG tablet Take 10 mg by mouth daily.    . Cholecalciferol (VITAMIN D) 400 UNITS capsule Take 400 Units by mouth daily.    Marland Kitchen donepezil (ARICEPT) 10 MG tablet Take 10 mg by mouth at bedtime.    .  fenofibrate 160 MG tablet TAKE ONE TABLET BY MOUTH DAILY 90 tablet 2  . fentaNYL (DURAGESIC - DOSED MCG/HR) 25 MCG/HR patch Apply  1 patch to skin every 3 days if tolerated 10 patch 0  . folic acid (FOLVITE) 1 MG tablet Take 1 mg by mouth 2 (two) times daily.     Marland Kitchen gabapentin (NEURONTIN) 800 MG tablet Take 1 tablet (800 mg total) by mouth 3 (three) times daily as needed. 270 tablet 3  . HYDROcodone-acetaminophen (NORCO/VICODIN) 5-325 MG tablet Limit  2-4 tablets by mouth per day for breakthrough pain while wearing fentanyl patch if tolerated 120 tablet 0  . hydroxychloroquine (PLAQUENIL) 200 MG tablet Take 200 mg by mouth daily.    Marland Kitchen lisinopril (PRINIVIL,ZESTRIL) 20 MG tablet TAKE 1 TABLET BY MOUTH DAILY 90 tablet 3  . lisinopril-hydrochlorothiazide (PRINZIDE,ZESTORETIC) 20-25 MG per tablet TAKE ONE TABLET BY MOUTH DAILY 90 tablet 3  . magnesium oxide (MAG-OX) 400 MG tablet Take by mouth.    . traZODone (DESYREL) 50 MG tablet Take 50-100 mg by mouth at bedtime. For sleep    . vitamin B-12 (CYANOCOBALAMIN) 1000 MCG tablet Take 1,000 mcg by mouth daily.    Marland Kitchen FLUoxetine (PROZAC) 20 MG capsule Take 1 capsule (20 mg total) by mouth daily. 30 capsule 0  . simvastatin (ZOCOR) 40 MG tablet TAKE 1 TABLET BY MOUTH DAILY 90 tablet 3  . tamsulosin (FLOMAX) 0.4 MG CAPS capsule TAKE 1 CAPSULE BY MOUTH EVERY DAY  90 capsule 1  . amoxicillin (AMOXIL) 500 MG capsule Take 4 capsules (2,000 mg total) by mouth once. Take 4 caps 1 hour prior to dental appointment. 4 capsule 2  . predniSONE (DELTASONE) 5 MG tablet Take 5 mg by mouth as needed. PRN for arthritis flair up     No facility-administered medications prior to visit.    ROS Review of Systems  Constitutional: Negative for fever, chills, diaphoresis and fatigue.  Eyes: Negative for visual disturbance.  Respiratory: Negative for chest tightness, shortness of breath and wheezing.   Cardiovascular: Negative for chest pain, palpitations and leg swelling.   Neurological: Negative for dizziness.  Psychiatric/Behavioral: The patient is not nervous/anxious.     Objective:  BP 140/70 mmHg  Pulse 54  Temp(Src) 97.7 F (36.5 C) (Oral)  Ht 6' (1.829 m)  Wt 207 lb 2 oz (93.951 kg)  BMI 28.08 kg/m2  SpO2 93%  Physical Exam  Constitutional: He is oriented to person, place, and time. He appears well-developed and well-nourished. No distress.  HENT:  Head: Normocephalic and atraumatic.  Right Ear: External ear normal.  Left Ear: External ear normal.  Eyes: Right eye exhibits no discharge. Left eye exhibits no discharge. No scleral icterus.  Cardiovascular: Regular rhythm.   Murmur heard. Pulmonary/Chest: Effort normal and breath sounds normal.  Neurological: He is alert and oriented to person, place, and time.  Skin: Skin is warm and dry. No rash noted. He is not diaphoretic.      Assessment & Plan:   Diagnoses and all orders for this visit:  Hyperlipidemia -     Cancel: Comprehensive metabolic panel -     Cancel: CBC with Differential/Platelet -     Cancel: Lipid panel -     CBC with Differential/Platelet; Future -     Comprehensive metabolic panel; Future -     Lipid panel; Future  Need for prophylactic vaccination and inoculation against influenza -     Flu vaccine HIGH DOSE PF (Fluzone High dose)  Need for prophylactic vaccination against Streptococcus pneumoniae (pneumococcus)  Anemia, unspecified anemia type  Cognitive decline  Essential hypertension  BPH (benign prostatic hypertrophy)  BPH with obstruction/lower urinary tract symptoms  Other orders -     Discontinue: FLUoxetine (PROZAC) 20 MG capsule; Take 1 capsule (20 mg total) by mouth daily. -     tamsulosin (FLOMAX) 0.4 MG CAPS capsule; Take 1 capsule (0.4 mg total) by mouth daily. -     FLUoxetine (PROZAC) 20 MG capsule; Take 1 capsule (20 mg total) by mouth daily. -     Pneumococcal polysaccharide vaccine 23-valent greater than or equal to 2yo  subcutaneous/IM   I have discontinued Mr. Fehringer predniSONE and amoxicillin. I have also changed his tamsulosin. Additionally, I am having him maintain his aspirin, vitamin 0000000, folic acid, Vitamin D, cetirizine, hydroxychloroquine, lisinopril-hydrochlorothiazide, donepezil, gabapentin, lisinopril, magnesium oxide, ALPHAGAN P, HYDROcodone-acetaminophen, fenofibrate, fentaNYL, traZODone, simvastatin, and FLUoxetine.  Meds ordered this encounter  Medications  . simvastatin (ZOCOR) 40 MG tablet    Sig: Take by mouth.  . DISCONTD: FLUoxetine (PROZAC) 20 MG capsule    Sig: Take 1 capsule (20 mg total) by mouth daily.    Dispense:  30 capsule    Refill:  5    Order Specific Question:  Supervising Provider    Answer:  Deborra Medina L [[id number]]  . tamsulosin (FLOMAX) 0.4 MG CAPS capsule    Sig: Take 1 capsule (0.4 mg total) by mouth daily.    Dispense:  90 capsule    Refill:  1    Order Specific Question:  Supervising Provider    Answer:  Derrel Nip, TERESA L [[id number]]  . FLUoxetine (PROZAC) 20 MG capsule    Sig: Take 1 capsule (20 mg total) by mouth daily.    Dispense:  90 capsule    Refill:  1    Order Specific Question:  Supervising Provider    Answer:  Crecencio Mc [[id number]]     Follow-up: Return in about 3 weeks (around 05/29/2015) for Fasting Lab Visit.

## 2015-05-09 NOTE — Progress Notes (Signed)
Pre visit review using our clinic review tool, if applicable. No additional management support is needed unless otherwise documented below in the visit note. 

## 2015-05-09 NOTE — Patient Instructions (Addendum)
Please make your lab appointment.   Thank you for getting your Pneumovax today!   Merry Christmas! 

## 2015-05-10 ENCOUNTER — Other Ambulatory Visit: Payer: Self-pay | Admitting: Pain Medicine

## 2015-05-19 DIAGNOSIS — N400 Enlarged prostate without lower urinary tract symptoms: Secondary | ICD-10-CM | POA: Insufficient documentation

## 2015-05-19 NOTE — Assessment & Plan Note (Addendum)
Patient is currently on Prozac to help with some depressive symptoms. He is cognitively intact today. Will follow. Refills given for Prozac

## 2015-05-19 NOTE — Assessment & Plan Note (Signed)
Will refill simvastatin today. Patient is to continue. Encouraged healthy diet and exercise. Future lipid panel fasting ordered.

## 2015-05-19 NOTE — Assessment & Plan Note (Signed)
BP Readings from Last 3 Encounters:  05/09/15 140/70  05/06/15 170/84  04/29/15 139/61   Improved reading today.

## 2015-05-19 NOTE — Assessment & Plan Note (Signed)
Patient currently on Flomax. Will continue this medication due to it being helpful. Refills given

## 2015-05-19 NOTE — Assessment & Plan Note (Signed)
Will obtain CBC future.

## 2015-05-29 ENCOUNTER — Ambulatory Visit: Payer: Medicare Other | Attending: Pain Medicine | Admitting: Pain Medicine

## 2015-05-29 ENCOUNTER — Other Ambulatory Visit: Payer: Self-pay | Admitting: Pain Medicine

## 2015-05-29 VITALS — BP 126/49 | HR 58 | Temp 97.9°F | Resp 16 | Ht 72.0 in | Wt 196.0 lb

## 2015-05-29 DIAGNOSIS — M069 Rheumatoid arthritis, unspecified: Secondary | ICD-10-CM | POA: Diagnosis not present

## 2015-05-29 DIAGNOSIS — M5416 Radiculopathy, lumbar region: Secondary | ICD-10-CM

## 2015-05-29 DIAGNOSIS — M47816 Spondylosis without myelopathy or radiculopathy, lumbar region: Secondary | ICD-10-CM

## 2015-05-29 DIAGNOSIS — M48061 Spinal stenosis, lumbar region without neurogenic claudication: Secondary | ICD-10-CM

## 2015-05-29 DIAGNOSIS — M533 Sacrococcygeal disorders, not elsewhere classified: Secondary | ICD-10-CM | POA: Diagnosis not present

## 2015-05-29 DIAGNOSIS — M47817 Spondylosis without myelopathy or radiculopathy, lumbosacral region: Secondary | ICD-10-CM | POA: Diagnosis not present

## 2015-05-29 DIAGNOSIS — M47812 Spondylosis without myelopathy or radiculopathy, cervical region: Secondary | ICD-10-CM

## 2015-05-29 DIAGNOSIS — M4806 Spinal stenosis, lumbar region: Secondary | ICD-10-CM | POA: Insufficient documentation

## 2015-05-29 DIAGNOSIS — M5126 Other intervertebral disc displacement, lumbar region: Secondary | ICD-10-CM | POA: Diagnosis not present

## 2015-05-29 DIAGNOSIS — M545 Low back pain: Secondary | ICD-10-CM | POA: Diagnosis present

## 2015-05-29 DIAGNOSIS — M170 Bilateral primary osteoarthritis of knee: Secondary | ICD-10-CM | POA: Diagnosis not present

## 2015-05-29 DIAGNOSIS — M5136 Other intervertebral disc degeneration, lumbar region: Secondary | ICD-10-CM | POA: Insufficient documentation

## 2015-05-29 DIAGNOSIS — M4726 Other spondylosis with radiculopathy, lumbar region: Secondary | ICD-10-CM

## 2015-05-29 DIAGNOSIS — M79606 Pain in leg, unspecified: Secondary | ICD-10-CM | POA: Diagnosis present

## 2015-05-29 MED ORDER — GABAPENTIN 800 MG PO TABS: 800.0000 mg | ORAL_TABLET | Freq: Three times a day (TID) | ORAL | Status: DC | PRN

## 2015-05-29 MED ORDER — HYDROCODONE-ACETAMINOPHEN 5-325 MG PO TABS: ORAL_TABLET | ORAL | Status: DC

## 2015-05-29 MED ORDER — FENTANYL 25 MCG/HR TD PT72: MEDICATED_PATCH | TRANSDERMAL | Status: DC

## 2015-05-29 NOTE — Patient Instructions (Signed)
PLAN   Continue present  Medication Neurontin  hydrocodone acetaminophen and fentanyl patch  F/U PCP  C Doss for evaliation of  BP and general medical  condition  F/U surgical evaluation. May consider pending follow-up evaluations  F/U neurological evaluation. May consider pending follow-up evaluations  F/U Dr. [name] as needed  F/U rheumatological evaluation  May consider radiofrequency rhizolysis or intraspinal procedures pending response to present treatment and F/U evaluation   Please call pain management for any concerns you may have prior to scheduled return appointment

## 2015-05-29 NOTE — Progress Notes (Signed)
   Subjective:    Patient ID: Bryan [name], male    DOB: [birth year], 75 y.o.   MRN: [id number]  HPI   The patient is a 75 year old gentleman who returns to pain management for further evaluation and treatment of pain involving the lower back and lower extremity region. Patient states the pain is aggravated by standing walking twisting turning maneuvers with pain becoming more intense as the day progresses. The patient is with history of rheumatoid arthritis with multiple arthralgias and myalgias.. We discussed patient's condition and will continue present treatment regimen and avoid interventional treatment. The patient is to call pain management prior to scheduled return appointment should there be change in condition. The patient agreed to suggested treatment plan   Review of Systems     Objective:   Physical Exam  There was tenderness to palpation of paraspinal must reason cervical region cervical facet region palpation which reproduces pain of mild degree reevaluation revealed patient to be with moderate tenderness to palpation of the cervical paraspinal musculature region cervical facet region palpation of the acromioclavicular and glenohumeral joint region was with mild to moderate discomfort. Patient appeared to be with unremarkable Tinel and Phalen's maneuver. Patient tolerated the. Palpation over the lumbar paraspinal musculature region lumbar facet region was attends to palpation with straight leg raising tolerated to approximately 20 without a definite increased pain with dorsiflexion noted. EHL strength was  decreased. Palpation over the thoracic facet thoracic paraspinal musculature region was with tenderness to palpation and limit range of motion of the thoracic spine. Palpation over the lumbar paraspinal musculatures and lumbar facet region was attends to palpation over the PSIS PII S region gluteal and piriformis musculature region as well as. Straight leg raising was tolerated  to approximately 20 without a definite increased pain with dorsiflexion noted. There was moderate to moderately severe tenderness to palpation of the greater trochanteric region and iliotibial band region. There was no sensory deficit or dermatomal distribution detected. Abdomen was nontender and no costovertebral tenderness was noted.     ASSESSMENT AND PLAN    Degenerative disc disease lumbar spine Asymmetric bulging L5-S1 severe right foraminal stenosis probable right L5 nerve root encroachment, left foraminal stenosis, annular fissure L5-S1, asymmetric left foraminal stenosis at L3-4 with indeterminate left renal lesion likely a hemorrhagic cyst. A small papillary tumor could have a similar appearance.  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 May consider pending follow-up evaluations  F/U neurological evaluation. May consider pending follow-up evaluations  F/U Dr. [name] as needed  F/U rheumatological evaluation  May consider radiofrequency rhizolysis or intraspinal procedures pending response to present treatment and F/U evaluation   Please call pain management for any concerns you may have prior to scheduled return appointment

## 2015-05-29 NOTE — Progress Notes (Signed)
Safety precautions to be maintained throughout the outpatient stay will include: orient to surroundings, keep bed in low position, maintain call bell within reach at all times, provide assistance with transfer out of bed and ambulation.  

## 2015-05-30 ENCOUNTER — Other Ambulatory Visit (INDEPENDENT_AMBULATORY_CARE_PROVIDER_SITE_OTHER): Payer: Medicare Other

## 2015-05-30 DIAGNOSIS — E785 Hyperlipidemia, unspecified: Secondary | ICD-10-CM | POA: Diagnosis not present

## 2015-05-30 LAB — LIPID PANEL
Cholesterol: 122 mg/dL (ref 0–200)
HDL: 54.2 mg/dL (ref >39.00)
LDL Cholesterol: 55 mg/dL (ref 0–99)
NonHDL: 67.8
Total CHOL/HDL Ratio: 2
Triglycerides: 63 mg/dL (ref 0.0–149.0)
VLDL: 12.6 mg/dL (ref 0.0–40.0)

## 2015-05-30 LAB — CBC WITH DIFFERENTIAL/PLATELET
Basophils Absolute: 0 10*3/uL (ref 0.0–0.1)
Basophils Relative: 0.5 % (ref 0.0–3.0)
Eosinophils Absolute: 0.1 10*3/uL (ref 0.0–0.7)
Eosinophils Relative: 2.9 % (ref 0.0–5.0)
HCT: 35 % — ABNORMAL LOW (ref 39.0–52.0)
Hemoglobin: 11.6 g/dL — ABNORMAL LOW (ref 13.0–17.0)
Lymphocytes Relative: 28.3 % (ref 12.0–46.0)
Lymphs Abs: 0.9 10*3/uL (ref 0.7–4.0)
MCHC: 33 g/dL (ref 30.0–36.0)
MCV: 94.2 fl (ref 78.0–100.0)
Monocytes Absolute: 0.4 10*3/uL (ref 0.1–1.0)
Monocytes Relative: 12.7 % — ABNORMAL HIGH (ref 3.0–12.0)
Neutro Abs: 1.7 10*3/uL (ref 1.4–7.7)
Neutrophils Relative %: 55.6 % (ref 43.0–77.0)
Platelets: 113 10*3/uL — ABNORMAL LOW (ref 150.0–400.0)
RBC: 3.72 Mil/uL — ABNORMAL LOW (ref 4.22–5.81)
RDW: 13.6 % (ref 11.5–15.5)
WBC: 3.1 10*3/uL — ABNORMAL LOW (ref 4.0–10.5)

## 2015-05-30 LAB — COMPREHENSIVE METABOLIC PANEL
ALT: 48 U/L (ref 0–53)
AST: 59 U/L — ABNORMAL HIGH (ref 0–37)
Albumin: 4.1 g/dL (ref 3.5–5.2)
Alkaline Phosphatase: 77 U/L (ref 39–117)
BUN: 34 mg/dL — ABNORMAL HIGH (ref 6–23)
CO2: 29 mEq/L (ref 19–32)
Calcium: 9.4 mg/dL (ref 8.4–10.5)
Chloride: 104 mEq/L (ref 96–112)
Creatinine, Ser: 1.5 mg/dL (ref 0.40–1.50)
GFR: 48.24 mL/min — ABNORMAL LOW (ref >60.00)
Glucose, Bld: 86 mg/dL (ref 70–99)
Potassium: 5 mEq/L (ref 3.5–5.1)
Sodium: 139 mEq/L (ref 135–145)
Total Bilirubin: 0.4 mg/dL (ref 0.2–1.2)
Total Protein: 6.7 g/dL (ref 6.0–8.3)

## 2015-06-03 ENCOUNTER — Ambulatory Visit: Payer: Medicare Other

## 2015-06-03 DIAGNOSIS — R0989 Other specified symptoms and signs involving the circulatory and respiratory systems: Secondary | ICD-10-CM

## 2015-06-06 LAB — TOXASSURE SELECT 13 (MW), URINE: PDF: 0

## 2015-06-23 DIAGNOSIS — M05772 Rheumatoid arthritis with rheumatoid factor of left ankle and foot without organ or systems involvement: Secondary | ICD-10-CM | POA: Diagnosis not present

## 2015-06-23 DIAGNOSIS — M05771 Rheumatoid arthritis with rheumatoid factor of right ankle and foot without organ or systems involvement: Secondary | ICD-10-CM | POA: Diagnosis not present

## 2015-06-23 DIAGNOSIS — M659 Synovitis and tenosynovitis, unspecified: Secondary | ICD-10-CM | POA: Diagnosis not present

## 2015-06-23 DIAGNOSIS — M1460 Charcot's joint, unspecified site: Secondary | ICD-10-CM | POA: Diagnosis not present

## 2015-06-23 DIAGNOSIS — M069 Rheumatoid arthritis, unspecified: Secondary | ICD-10-CM | POA: Diagnosis not present

## 2015-06-24 ENCOUNTER — Encounter: Payer: Self-pay | Admitting: *Deleted

## 2015-06-24 ENCOUNTER — Emergency Department: Payer: Medicare Other

## 2015-06-24 ENCOUNTER — Emergency Department
Admission: EM | Admit: 2015-06-24 | Discharge: 2015-06-24 | Disposition: A | Discharge status: Home / Self Care | Payer: Medicare Other | Attending: Emergency Medicine | Admitting: Emergency Medicine

## 2015-06-24 DIAGNOSIS — I129 Hypertensive chronic kidney disease with stage 1 through stage 4 chronic kidney disease, or unspecified chronic kidney disease: Secondary | ICD-10-CM | POA: Diagnosis not present

## 2015-06-24 DIAGNOSIS — M25531 Pain in right wrist: Secondary | ICD-10-CM | POA: Diagnosis not present

## 2015-06-24 DIAGNOSIS — X500XXA Overexertion from strenuous movement or load, initial encounter: Secondary | ICD-10-CM | POA: Diagnosis not present

## 2015-06-24 DIAGNOSIS — Z791 Long term (current) use of non-steroidal anti-inflammatories (NSAID): Secondary | ICD-10-CM | POA: Diagnosis not present

## 2015-06-24 DIAGNOSIS — Y9289 Other specified places as the place of occurrence of the external cause: Secondary | ICD-10-CM | POA: Diagnosis not present

## 2015-06-24 DIAGNOSIS — Y9389 Activity, other specified: Secondary | ICD-10-CM | POA: Insufficient documentation

## 2015-06-24 DIAGNOSIS — Z87891 Personal history of nicotine dependence: Secondary | ICD-10-CM | POA: Insufficient documentation

## 2015-06-24 DIAGNOSIS — Z7982 Long term (current) use of aspirin: Secondary | ICD-10-CM | POA: Insufficient documentation

## 2015-06-24 DIAGNOSIS — Y998 Other external cause status: Secondary | ICD-10-CM | POA: Diagnosis not present

## 2015-06-24 DIAGNOSIS — M7989 Other specified soft tissue disorders: Secondary | ICD-10-CM | POA: Diagnosis not present

## 2015-06-24 DIAGNOSIS — N189 Chronic kidney disease, unspecified: Secondary | ICD-10-CM | POA: Insufficient documentation

## 2015-06-24 DIAGNOSIS — Z79899 Other long term (current) drug therapy: Secondary | ICD-10-CM | POA: Diagnosis not present

## 2015-06-24 DIAGNOSIS — Z880 Allergy status to penicillin: Secondary | ICD-10-CM | POA: Insufficient documentation

## 2015-06-24 DIAGNOSIS — Z79891 Long term (current) use of opiate analgesic: Secondary | ICD-10-CM | POA: Diagnosis not present

## 2015-06-24 DIAGNOSIS — S6991XA Unspecified injury of right wrist, hand and finger(s), initial encounter: Secondary | ICD-10-CM | POA: Diagnosis present

## 2015-06-24 DIAGNOSIS — S66911A Strain of unspecified muscle, fascia and tendon at wrist and hand level, right hand, initial encounter: Secondary | ICD-10-CM | POA: Insufficient documentation

## 2015-06-24 MED ORDER — NAPROXEN 500 MG PO TABS: 500.0000 mg | ORAL_TABLET | Freq: Two times a day (BID) | ORAL | Status: DC

## 2015-06-24 NOTE — ED Notes (Signed)
States right wrist pain, states this AM he picked up a heavy pot and irriatted the wrist more, arrives with wrist in brace, radial pulse 2+

## 2015-06-24 NOTE — ED Provider Notes (Signed)
[facility] Emergency Department Provider Note  ____________________________________________  Time seen: Approximately 12:07 PM  I have reviewed the triage vital signs and the nursing notes.   HISTORY  Chief Complaint Wrist Pain    HPI Bryan [name] is a 75 y.o. male who presents for evaluation of right wrist pain. Patient reports that he picked up a heavy pot which flexed his wrist backwards. Complains of severe pain to the dorsal aspect of his wrist. Currently wearing a wrist brace given to his wife for carpal tunnel.His pain was sudden in onset and worse with movement. Pain is better with elevation and held in one position.   Past Medical History  Diagnosis Date  . Coronary artery disease   . Hyperlipidemia   . H/O thoracic aortic aneurysm repair   . Valvular heart disease   . Paroxysmal a-fib ([place])   . Hypertension   . Arrhythmia   . Glaucoma   . Arthritis   . Depression   . Allergy   . History of blood transfusion   . History of chicken pox   . Chronic back pain     Patient Active Problem List   Diagnosis Date Noted  . Carotid stenosis 05/06/2015  . 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 Chronic kidney disease 02/17/2015  . 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  . 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 Rheumatoid arthritis ([place]) 10/01/2014  . DJD (degenerative joint disease), cervical 10/01/2014  . DJD (degenerative joint disease), lumbar 10/01/2014  . Sacroiliac joint disease 10/01/2014  . Dorsopathy 10/01/2014  . DDD (degenerative disc disease), cervical 10/01/2014  . Lumbar and sacral osteoarthritis 10/01/2014  . Amnesia 06/20/2014  . Mechanical and motor problems with internal organs 06/20/2014  . Transient alteration of awareness 06/19/2014  . Anemia due to other cause 06/19/2014  . Absolute anemia 06/19/2014  . Aneurysm, ascending aorta ([employer]) 05/27/2014  .  Aneurysm of thoracic aorta ([place]) 05/27/2014  . Thoracic aortic aneurysm without rupture ([place]) 05/27/2014  . Cognitive decline 04/01/2014  . Spinal stenosis of lumbar region with radiculopathy 01/30/2014  . Vitamin D deficiency 01/30/2014  . HLD (hyperlipidemia) 01/30/2014  . Back ache 01/30/2014  . Lumbar canal stenosis 01/30/2014  . Paroxysmal atrial fibrillation ([place]) 11/27/2013  . S/P AVR (aortic valve replacement) 11/27/2013  . History of ascending aorta repair 11/27/2013  . Hyperlipidemia 11/27/2013  . Essential hypertension 11/27/2013  . Bicuspid aortic valve 11/27/2013  . CAD in native artery 11/27/2013  . Aortic regurgitation, congenital 11/27/2013  . History of open heart surgery 11/27/2013  . Postprocedural state 11/27/2013  . History of cardiovascular surgery 11/27/2013    Past Surgical History  Procedure Laterality Date  . Thoracic aortic aneurysm repair  2010  . Aortic valve replacement    . Cholecystectomy  2010  . Appendectomy  2010    Current Outpatient Rx  Name  Route  Sig  Dispense  Refill  . ALPHAGAN P 0.1 % SOLN   Both Eyes   Place 1 drop into both eyes daily.            Dispense as written.   Marland Kitchen aspirin 325 MG tablet   Oral   Take 325 mg by mouth daily.         . cetirizine (ZYRTEC) 10 MG tablet   Oral   Take 10 mg by mouth daily.         . Cholecalciferol (VITAMIN D) 400 UNITS  capsule   Oral   Take 400 Units by mouth daily.         Marland Kitchen donepezil (ARICEPT) 10 MG tablet   Oral   Take 10 mg by mouth at bedtime.         . fenofibrate 160 MG tablet      TAKE ONE TABLET BY MOUTH DAILY   90 tablet   2     PLEASE SEND REFILLS PATIENT IS OUTTHANK YOU   . fentaNYL (DURAGESIC - DOSED MCG/HR) 25 MCG/HR patch      Apply  1 patch to skin every 3 days if tolerated   10 patch   0   . FLUoxetine (PROZAC) 20 MG capsule   Oral   Take 1 capsule (20 mg total) by mouth daily.   90 capsule   1   . folic acid (FOLVITE) 1 MG tablet    Oral   Take 1 mg by mouth 2 (two) times daily.          Marland Kitchen gabapentin (NEURONTIN) 800 MG tablet   Oral   Take 1 tablet (800 mg total) by mouth 3 (three) times daily as needed.   270 tablet   3   . HYDROcodone-acetaminophen (NORCO/VICODIN) 5-325 MG tablet      Limit  2-4 tablets by mouth per day for breakthrough pain while wearing fentanyl patch if tolerated   120 tablet   0   . hydroxychloroquine (PLAQUENIL) 200 MG tablet   Oral   Take 200 mg by mouth daily.         Marland Kitchen lisinopril (PRINIVIL,ZESTRIL) 20 MG tablet      TAKE 1 TABLET BY MOUTH DAILY   90 tablet   3   . lisinopril-hydrochlorothiazide (PRINZIDE,ZESTORETIC) 20-25 MG per tablet      TAKE ONE TABLET BY MOUTH DAILY   90 tablet   3     REFILLS FOR NEXT TIMETHANKS                    PRO ...   . magnesium oxide (MAG-OX) 400 MG tablet   Oral   Take by mouth.         . naproxen (NAPROSYN) 500 MG tablet   Oral   Take 1 tablet (500 mg total) by mouth 2 (two) times daily with a meal.   60 tablet   0   . simvastatin (ZOCOR) 40 MG tablet   Oral   Take by mouth.         . tamsulosin (FLOMAX) 0.4 MG CAPS capsule   Oral   Take 1 capsule (0.4 mg total) by mouth daily.   90 capsule   1   . traZODone (DESYREL) 50 MG tablet   Oral   Take 50-100 mg by mouth at bedtime. For sleep         . vitamin B-12 (CYANOCOBALAMIN) 1000 MCG tablet   Oral   Take 1,000 mcg by mouth daily.           Allergies Demerol and Penicillins  Family History  Problem Relation Age of Onset  . Heart failure Mother   . Hypertension Mother   . Asthma Mother   . Heart attack Father 59    MI  . Hypertension Sister     Social History Social History  Substance Use Topics  . Smoking status: Former Smoker -- 1.00 packs/day for 32 years    Types: Cigarettes    Quit date: 07/05/1981  .  Smokeless tobacco: None  . Alcohol Use: 0.6 oz/week    1 Glasses of wine per week    Review of Systems Constitutional: No  fever/chills Eyes: No visual changes. ENT: No sore throat. Cardiovascular: Denies chest pain. Respiratory: Denies shortness of breath. Gastrointestinal: No abdominal pain.  No nausea, no vomiting.  No diarrhea.  No constipation. Genitourinary: Negative for dysuria. Musculoskeletal: Positive for right wrist pain. Skin: Negative for rash. Neurological: Negative for headaches, focal weakness or numbness.  10-point ROS otherwise negative.  ____________________________________________   PHYSICAL EXAM:  VITAL SIGNS: ED Triage Vitals  Enc Vitals Group     BP 06/24/15 1146 128/78 mmHg     Pulse Rate 06/24/15 1146 80     Resp 06/24/15 1146 18     Temp 06/24/15 1146 98 F (36.7 C)     Temp Source 06/24/15 1146 Oral     SpO2 06/24/15 1146 95 %     Weight 06/24/15 1146 190 lb (86.183 kg)     Height 06/24/15 1146 6' (1.829 m)     Head Cir --      Peak Flow --      Pain Score 06/24/15 1147 3     Pain Loc --      Pain Edu? --      Excl. in [place]? --     Constitutional: Alert and oriented. Well appearing and in no acute distress. Musculoskeletal: Right wrist with full range of motion mild ecchymosis noted and bruising to the palmar aspect of the wrist. Distally neurovascularly intact with good capillary refill. Neurologic:  Normal speech and language. No gross focal neurologic deficits are appreciated. No gait instability. Skin:  Skin is warm, dry and intact. No rash noted. Psychiatric: Mood and affect are normal. Speech and behavior are normal.  ____________________________________________   LABS (all labs ordered are listed, but only abnormal results are displayed)  Labs Reviewed - No data to display  RADIOLOGY  IMPRESSION: 1. Mildly exaggerated scapholunate distance and borderline increased scapholunate angle suspicious for scapholunate ligament tear and mild dorsal intercalated segmental instability. 2. Mildly reduced articular space at the radioscaphoid articulation and  between the scaphoid and the trapezium, appears degenerative. ____________________________________________   PROCEDURES  Procedure(s) performed: None  Critical Care performed: No  ____________________________________________   INITIAL IMPRESSION / ASSESSMENT AND PLAN / ED COURSE  Pertinent labs & imaging results that were available during my care of the patient were reviewed by me and considered in my medical decision making (see chart for details).  Acute wrist strain with probable scapholunate ligament tear. Cockup wrist splint  Rx given for Naprosyn 500 mg twice a day. Patient currently has controlled substances at home. He is to follow-up with Dr. Roland Rack for follow-up. Patient voices no other emergency medical complaints at this time. ____________________________________________   FINAL CLINICAL IMPRESSION(S) / ED DIAGNOSES  Final diagnoses:  Wrist strain, right, initial encounter     This chart was dictated using voice recognition software/Dragon. Despite best efforts to proofread, errors can occur which can change the meaning. Any change was purely unintentional.   Arlyss Repress, PA-C 06/24/15 1331  Earleen Newport, MD 06/24/15 [phone]

## 2015-06-25 ENCOUNTER — Telehealth: Payer: Self-pay | Admitting: Urology

## 2015-06-25 NOTE — Telephone Encounter (Signed)
Pt called and feels like his testosterone is low.  He has an upcoming appt 6/6 to check it, but wants to know if he can come in before then.

## 2015-06-25 NOTE — Telephone Encounter (Signed)
Spoke with patient and told him it was about time for him to have a Testosterone draw and that he needs to have a new Testopel paper filled out. Patient to come in on February 15 for lab draw and to fill out form. I let patient know that i would let him know when and if the Testopel is approved. Patient ok with plan and appointment for labs has been made.

## 2015-06-27 ENCOUNTER — Ambulatory Visit (INDEPENDENT_AMBULATORY_CARE_PROVIDER_SITE_OTHER): Payer: Medicare Other

## 2015-06-27 VITALS — BP 138/68 | HR 62 | Temp 98.1°F | Resp 14 | Ht 70.0 in | Wt 180.1 lb

## 2015-06-27 DIAGNOSIS — Z0000 Encounter for general adult medical examination without abnormal findings: Secondary | ICD-10-CM

## 2015-06-27 NOTE — Progress Notes (Signed)
I have read and agree with the note and plan via our Health Coach.   [name], NP-C [phone] 06/27/15

## 2015-06-27 NOTE — Progress Notes (Signed)
Subjective:   Bryan [name] is a 75 y.o. male who presents for an Initial Medicare Annual Wellness Visit.  Review of Systems  No ROS.  Medicare Wellness Visit. Cardiac Risk Factors include: hypertension;male gender;advanced age (>21men, >5 women)    Objective:    Today's Vitals   06/27/15 1024 06/27/15 1027  BP: 138/68   Pulse: 62   Temp: 98.1 F (36.7 C)   TempSrc: Oral   Resp: 14   Height: 5\' 10"  (1.778 m)   Weight: 180 lb 1.9 oz (81.702 kg)   SpO2: 95%   PainSc:  5     Current Medications (verified) Outpatient Encounter Prescriptions as of 06/27/2015  Medication Sig  . ALPHAGAN P 0.1 % SOLN Place 1 drop into both eyes daily.   Marland Kitchen aspirin 325 MG tablet Take 325 mg by mouth daily.  . cetirizine (ZYRTEC) 10 MG tablet Take 10 mg by mouth daily.  . Cholecalciferol (VITAMIN D) 400 UNITS capsule Take 400 Units by mouth daily.  Marland Kitchen donepezil (ARICEPT) 10 MG tablet Take 10 mg by mouth at bedtime.  . fenofibrate 160 MG tablet TAKE ONE TABLET BY MOUTH DAILY  . fentaNYL (DURAGESIC - DOSED MCG/HR) 25 MCG/HR patch Apply  1 patch to skin every 3 days if tolerated  . FLUoxetine (PROZAC) 20 MG capsule Take 1 capsule (20 mg total) by mouth daily.  . folic acid (FOLVITE) 1 MG tablet Take 1 mg by mouth 2 (two) times daily.   Marland Kitchen gabapentin (NEURONTIN) 800 MG tablet Take 1 tablet (800 mg total) by mouth 3 (three) times daily as needed.  Marland Kitchen HYDROcodone-acetaminophen (NORCO/VICODIN) 5-325 MG tablet Limit  2-4 tablets by mouth per day for breakthrough pain while wearing fentanyl patch if tolerated  . hydroxychloroquine (PLAQUENIL) 200 MG tablet Take 200 mg by mouth daily.  Marland Kitchen lisinopril (PRINIVIL,ZESTRIL) 20 MG tablet TAKE 1 TABLET BY MOUTH DAILY  . lisinopril-hydrochlorothiazide (PRINZIDE,ZESTORETIC) 20-25 MG per tablet TAKE ONE TABLET BY MOUTH DAILY  . magnesium oxide (MAG-OX) 400 MG tablet Take by mouth.  . naproxen (NAPROSYN) 500 MG tablet Take 1 tablet (500 mg total) by mouth 2 (two)  times daily with a meal.  . simvastatin (ZOCOR) 40 MG tablet Take by mouth.  . tamsulosin (FLOMAX) 0.4 MG CAPS capsule Take 1 capsule (0.4 mg total) by mouth daily.  . traZODone (DESYREL) 50 MG tablet Take 50-100 mg by mouth at bedtime. For sleep  . vitamin B-12 (CYANOCOBALAMIN) 1000 MCG tablet Take 1,000 mcg by mouth daily.   No facility-administered encounter medications on file as of 06/27/2015.    Allergies (verified) Demerol and Penicillins   History: Past Medical History  Diagnosis Date  . Coronary artery disease   . Hyperlipidemia   . H/O thoracic aortic aneurysm repair   . Valvular heart disease   . Paroxysmal a-fib ([place])   . Hypertension   . Arrhythmia   . Glaucoma   . Arthritis   . Depression   . Allergy   . History of blood transfusion   . History of chicken pox   . Chronic back pain    Past Surgical History  Procedure Laterality Date  . Thoracic aortic aneurysm repair  2010  . Aortic valve replacement    . Cholecystectomy  2010  . Appendectomy  2010   Family History  Problem Relation Age of Onset  . Heart failure Mother   . Hypertension Mother   . Asthma Mother   . Heart attack Father 64  MI  . Hypertension Sister    Social History   Occupational History  . Not on file.   Social History Main Topics  . Smoking status: Former Smoker -- 1.00 packs/day for 32 years    Types: Cigarettes    Quit date: 07/05/1981  . Smokeless tobacco: Not on file  . Alcohol Use: 0.6 oz/week    1 Glasses of wine per week  . Drug Use: No  . Sexual Activity: Yes   Tobacco Counseling Counseling given: Not Answered   Activities of Daily Living In your present state of health, do you have any difficulty performing the following activities: 06/27/2015  Hearing? N  Vision? N  Difficulty concentrating or making decisions? N  Walking or climbing stairs? Y  Dressing or bathing? N  Doing errands, shopping? N  Preparing Food and eating ? N  Using the Toilet? N  In the  past six months, have you accidently leaked urine? N  Do you have problems with loss of bowel control? Y  Managing your Medications? N  Managing your Finances? N  Housekeeping or managing your Housekeeping? N    Immunizations and Health Maintenance Immunization History  Administered Date(s) Administered  . Influenza Split 04/09/2014  . Influenza, High Dose Seasonal PF 05/09/2015  . Pneumococcal Conjugate-13 12/13/2013  . Pneumococcal Polysaccharide-23 05/09/2015  . Tdap 12/13/2013   There are no preventive care reminders to display for this patient.  Patient Care Team: Rubbie Battiest, NP as PCP - General (Nurse Practitioner)  Indicate any recent Medical Services you may have received from other than [facility] providers in the past year (date may be approximate).    Assessment:   This is a routine wellness examination for Glynn. The goal of the wellness visit is to assist the patient how to close the gaps in care and create a preventative care plan for the patient.   VIT D Calcium as appropriate/Osteoporosis risk reviewed.  Medications reviewed; taking without issues or barriers.  Safety issues reviewed; smoke detectors in the home. No firearms in the home. Wears seatbelts when driving or riding with others. No violence in the home.  No identified risk were noted; The patient was oriented x 3; appropriate in dress and manner and no objective failures at ADL's or IADL's.   Thoracic aortic aneu-stable and followed by Dr. [name] Atrial fibrillation-stable and followed by Dr. [name] Rheumatoid arthritis-stable and followed by Dr. Lindon Romp Paroxysmal atrial fibrillation-stable and followed by Dr. [name] Rheumatoid arthritis, unspecified-stable and followed by Dr. Lindon Romp.  Depression screening; refused. Pleasant affect. 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  Remarks of some days being a little tougher than others, but was doing okay.  Stated he was not in danger of harming himself or others.  Encouraged to follow up with PCP as needed and if he became hopeless, down on himself or believed he is a harm to himself or others.  Patient Concerns:  Frequent diarrhea episodes; appointment made for follow up with PCP.  Right wrist pain; pain scale 5 today, appointment scheduled with ortho, currently wearing a wrist brace and taking medication for relief.  Deferred to follow up with PCP as needed.  Hearing/Vision screen Hearing Screening Comments: Passes the whisper test Vision Screening Comments: Followed by [facility], Dr. Jeni Salles Wears readers only Bilateral cataracts extracted   Dietary issues and exercise activities  discussed: Current Exercise Habits:: Home exercise routine (Walks the dog three times a day), Type of exercise: walking, Time (Minutes): 10, Frequency (Times/Week): 3, Weekly Exercise (Minutes/Week): 30, Intensity: Mild  Goals    . Healthy diet     Stay hydrated!  Drink plenty of fluids.  Substitute 1 diet coke for 1 bottle (2 cups) of water. Choose lean meats, fruits and vegetables.    . Increase physical activity     Maintain current walking regiment and begin swimming again with water aerobics 3 times a week for 30 minute sessions.      Depression Screen PHQ 2/9 Scores 06/27/2015 05/29/2015 04/29/2015 04/01/2015  PHQ - 2 Score - 0 0 0  Exception Documentation Patient refusal Patient refusal Patient refusal -    Fall Risk Fall Risk  06/27/2015 05/29/2015 04/29/2015 04/01/2015 02/27/2015  Falls in the past year? No (No Data) (No Data) No (No Data)    Cognitive Function: MMSE - Mini Mental State Exam 06/27/2015  Orientation to time 5  Orientation to Place 5  Registration 3  Attention/ Calculation 5  Recall 3  Language- name 2 objects 2  Language- repeat 1  Language- follow 3 step command 3  Language- read &  follow direction 1  Write a sentence 1  Copy design 1  Total score 30    Screening Tests Health Maintenance  Topic Date Due  . INFLUENZA VACCINE  12/23/2015  . TETANUS/TDAP  12/14/2023  . ZOSTAVAX  Addressed  . PNA vac Low Risk Adult  Completed        Plan:   End of life planning; Advanced aging; Advanced directives discussed. Copy requested of HCPOA/Living Will.   During the course of the visit Benjiman was educated and counseled about the following appropriate screening and preventive services:   Vaccines to include Pneumoccal, Influenza, Hepatitis B, Td, Zostavax, HCV  Electrocardiogram  Colorectal cancer screening  Cardiovascular disease screening  Diabetes screening  Glaucoma screening  Nutrition counseling  Prostate cancer screening  Smoking cessation counseling  Patient Instructions (the written plan) were given to the patient.   Varney Biles, LPN   [phone]

## 2015-06-27 NOTE — Patient Instructions (Addendum)
Bryan [name],  Thank you for taking time to come for your Medicare Wellness Visit.  I appreciate your ongoing commitment to your health goals. Please review the following plan we discussed and let me know if I can assist you in the future.  Return for scheduled follow up with PCP.   Health Maintenance, Male A healthy lifestyle and preventative care can promote health and wellness.  Maintain regular health, dental, and eye exams.  Eat a healthy diet. Foods like vegetables, fruits, whole grains, low-fat dairy products, and lean protein foods contain the nutrients you need and are low in calories. Decrease your intake of foods high in solid fats, added sugars, and salt. Get information about a proper diet from your health care provider, if necessary.  Regular physical exercise is one of the most important things you can do for your health. Most adults should get at least 150 minutes of moderate-intensity exercise (any activity that increases your heart rate and causes you to sweat) each week. In addition, most adults need muscle-strengthening exercises on 2 or more days a week.   Maintain a healthy weight. The body mass index (BMI) is a screening tool to identify possible weight problems. It provides an estimate of body fat based on height and weight. Your health care provider can find your BMI and can help you achieve or maintain a healthy weight. For males 20 years and older:  A BMI below 18.5 is considered underweight.  A BMI of 18.5 to 24.9 is normal.  A BMI of 25 to 29.9 is considered overweight.  A BMI of 30 and above is considered obese.  Maintain normal blood lipids and cholesterol by exercising and minimizing your intake of saturated fat. Eat a balanced diet with plenty of fruits and vegetables. Blood tests for lipids and cholesterol should begin at age 87 and be repeated every 5 years. If your lipid or cholesterol levels are high, you are over age 29, or you are at high risk for  heart disease, you may need your cholesterol levels checked more frequently.Ongoing high lipid and cholesterol levels should be treated with medicines if diet and exercise are not working.  If you smoke, find out from your health care provider how to quit. If you do not use tobacco, do not start.  Lung cancer screening is recommended for adults aged 29-80 years who are at high risk for developing lung cancer because of a history of smoking. A yearly low-dose CT scan of the lungs is recommended for people who have at least a 30-pack-year history of smoking and are current smokers or have quit within the past 15 years. A pack year of smoking is smoking an average of 1 pack of cigarettes a day for 1 year (for example, a 30-pack-year history of smoking could mean smoking 1 pack a day for 30 years or 2 packs a day for 15 years). Yearly screening should continue until the smoker has stopped smoking for at least 15 years. Yearly screening should be stopped for people who develop a health problem that would prevent them from having lung cancer treatment.  If you choose to drink alcohol, do not have more than 2 drinks per day. One drink is considered to be 12 oz (360 mL) of beer, 5 oz (150 mL) of wine, or 1.5 oz (45 mL) of liquor.  Avoid the use of street drugs. Do not share needles with anyone. Ask for help if you need support or instructions about stopping the use  of drugs.  High blood pressure causes heart disease and increases the risk of stroke. High blood pressure is more likely to develop in:  People who have blood pressure in the end of the normal range (100-139/85-89 mm Hg).  People who are overweight or obese.  People who are African American.  If you are 62-49 years of age, have your blood pressure checked every 3-5 years. If you are 67 years of age or older, have your blood pressure checked every year. You should have your blood pressure measured twice--once when you are at a hospital or  clinic, and once when you are not at a hospital or clinic. Record the average of the two measurements. To check your blood pressure when you are not at a hospital or clinic, you can use:  An automated blood pressure machine at a pharmacy.  A home blood pressure monitor.  If you are 3-43 years old, ask your health care provider if you should take aspirin to prevent heart disease.  Diabetes screening involves taking a blood sample to check your fasting blood sugar level. This should be done once every 3 years after age 48 if you are at a normal weight and without risk factors for diabetes. Testing should be considered at a younger age or be carried out more frequently if you are overweight and have at least 1 risk factor for diabetes.  Colorectal cancer can be detected and often prevented. Most routine colorectal cancer screening begins at the age of 48 and continues through age 12. However, your health care provider may recommend screening at an earlier age if you have risk factors for colon cancer. On a yearly basis, your health care provider may provide home test kits to check for hidden blood in the stool. A small camera at the end of a tube may be used to directly examine the colon (sigmoidoscopy or colonoscopy) to detect the earliest forms of colorectal cancer. Talk to your health care provider about this at age 53 when routine screening begins. A direct exam of the colon should be repeated every 5-10 years through age 77, unless early forms of precancerous polyps or small growths are found.  People who are at an increased risk for hepatitis B should be screened for this virus. You are considered at high risk for hepatitis B if:  You were born in a country where hepatitis B occurs often. Talk with your health care provider about which countries are considered high risk.  Your parents were born in a high-risk country and you have not received a shot to protect against hepatitis B (hepatitis B  vaccine).  You have HIV or AIDS.  You use needles to inject street drugs.  You live with, or have sex with, someone who has hepatitis B.  You are a man who has sex with other men (MSM).  You get hemodialysis treatment.  You take certain medicines for conditions like cancer, organ transplantation, and autoimmune conditions.  Hepatitis C blood testing is recommended for all people born from [birth year] through 1967 and any individual with known risk factors for hepatitis C.  Healthy men should no longer receive prostate-specific antigen (PSA) blood tests as part of routine cancer screening. Talk to your health care provider about prostate cancer screening.  Testicular cancer screening is not recommended for adolescents or adult males who have no symptoms. Screening includes self-exam, a health care provider exam, and other screening tests. Consult with your health care provider about any symptoms you  have or any concerns you have about testicular cancer.  Practice safe sex. Use condoms and avoid high-risk sexual practices to reduce the spread of sexually transmitted infections (STIs).  You should be screened for STIs, including gonorrhea and chlamydia if:  You are sexually active and are younger than 24 years.  You are older than 24 years, and your health care provider tells you that you are at risk for this type of infection.  Your sexual activity has changed since you were last screened, and you are at an increased risk for chlamydia or gonorrhea. Ask your health care provider if you are at risk.  If you are at risk of being infected with HIV, it is recommended that you take a prescription medicine daily to prevent HIV infection. This is called pre-exposure prophylaxis (PrEP). You are considered at risk if:  You are a man who has sex with other men (MSM).  You are a heterosexual man who is sexually active with multiple partners.  You take drugs by injection.  You are sexually active  with a partner who has HIV.  Talk with your health care provider about whether you are at high risk of being infected with HIV. If you choose to begin PrEP, you should first be tested for HIV. You should then be tested every 3 months for as long as you are taking PrEP.  Use sunscreen. Apply sunscreen liberally and repeatedly throughout the day. You should seek shade when your shadow is shorter than you. Protect yourself by wearing long sleeves, pants, a wide-brimmed hat, and sunglasses year round whenever you are outdoors.  Tell your health care provider of new moles or changes in moles, especially if there is a change in shape or color. Also, tell your health care provider if a mole is larger than the size of a pencil eraser.  A one-time screening for abdominal aortic aneurysm (AAA) and surgical repair of large AAAs by ultrasound is recommended for men aged 60-75 years who are current or former smokers.  Stay current with your vaccines (immunizations).   This information is not intended to replace advice given to you by your health care provider. Make sure you discuss any questions you have with your health care provider.   Document Released: 11/06/2007 Document Revised: 05/31/2014 Document Reviewed: 10/05/2010 Elsevier Interactive Patient Education [employer].

## 2015-07-01 ENCOUNTER — Ambulatory Visit: Payer: Medicare Other | Attending: Pain Medicine | Admitting: Pain Medicine

## 2015-07-01 VITALS — BP 148/69 | HR 55 | Temp 98.1°F | Resp 16 | Ht 71.0 in | Wt 195.0 lb

## 2015-07-01 DIAGNOSIS — N289 Disorder of kidney and ureter, unspecified: Secondary | ICD-10-CM | POA: Insufficient documentation

## 2015-07-01 DIAGNOSIS — M533 Sacrococcygeal disorders, not elsewhere classified: Secondary | ICD-10-CM | POA: Insufficient documentation

## 2015-07-01 DIAGNOSIS — M4806 Spinal stenosis, lumbar region: Secondary | ICD-10-CM | POA: Diagnosis not present

## 2015-07-01 DIAGNOSIS — M48061 Spinal stenosis, lumbar region without neurogenic claudication: Secondary | ICD-10-CM

## 2015-07-01 DIAGNOSIS — M5136 Other intervertebral disc degeneration, lumbar region: Secondary | ICD-10-CM | POA: Insufficient documentation

## 2015-07-01 DIAGNOSIS — M170 Bilateral primary osteoarthritis of knee: Secondary | ICD-10-CM | POA: Diagnosis not present

## 2015-07-01 DIAGNOSIS — M5416 Radiculopathy, lumbar region: Secondary | ICD-10-CM

## 2015-07-01 DIAGNOSIS — M5126 Other intervertebral disc displacement, lumbar region: Secondary | ICD-10-CM | POA: Insufficient documentation

## 2015-07-01 DIAGNOSIS — M069 Rheumatoid arthritis, unspecified: Secondary | ICD-10-CM | POA: Diagnosis not present

## 2015-07-01 DIAGNOSIS — M47816 Spondylosis without myelopathy or radiculopathy, lumbar region: Secondary | ICD-10-CM

## 2015-07-01 DIAGNOSIS — M542 Cervicalgia: Secondary | ICD-10-CM | POA: Diagnosis present

## 2015-07-01 DIAGNOSIS — M549 Dorsalgia, unspecified: Secondary | ICD-10-CM | POA: Diagnosis present

## 2015-07-01 DIAGNOSIS — M47817 Spondylosis without myelopathy or radiculopathy, lumbosacral region: Secondary | ICD-10-CM | POA: Diagnosis not present

## 2015-07-01 DIAGNOSIS — M4726 Other spondylosis with radiculopathy, lumbar region: Secondary | ICD-10-CM

## 2015-07-01 DIAGNOSIS — M47812 Spondylosis without myelopathy or radiculopathy, cervical region: Secondary | ICD-10-CM

## 2015-07-01 MED ORDER — FENTANYL 25 MCG/HR TD PT72: MEDICATED_PATCH | TRANSDERMAL | Status: DC

## 2015-07-01 MED ORDER — HYDROCODONE-ACETAMINOPHEN 5-325 MG PO TABS: ORAL_TABLET | ORAL | Status: DC

## 2015-07-01 NOTE — Progress Notes (Signed)
Safety precautions to be maintained throughout the outpatient stay will include: orient to surroundings, keep bed in low position, maintain call bell within reach at all times, provide assistance with transfer out of bed and ambulation.  

## 2015-07-01 NOTE — Patient Instructions (Addendum)
PLAN   Continue present  Medication Neurontin  hydrocodone acetaminophen and fentanyl patch  F/U PCP  C Doss for evaliation of  BP and general medical  condition  F/U surgical evaluation. May consider pending follow-up evaluations  F/U neurological evaluation. May consider pending follow-up evaluations  F/U Dr. [name] as needed  F/U rheumatological evaluation  May consider radiofrequency rhizolysis or intraspinal procedures pending response to present treatment and F/U evaluation   Please call pain management for any concerns you may have prior to scheduled return appointment  A prescription for FENTANYL and HYDROCODONE was given to you today.

## 2015-07-01 NOTE — Progress Notes (Signed)
Subjective:    Patient ID: Bryan [name], male    DOB: 16-Nov-1940, 75 y.o.   MRN: [id number]  HPI  The patient is a 75 year old gentleman who returns to pain management for further evaluation and treatment of pain involving the neck entire back upper and lower extremity regions. The patient is with diagnosis of rheumatoid arthritis. The patient is with multiple arthralgias and myalgias. At the present time patient is to follow-up with Dr. [name] regarding pain involving the feet which of the most bothersome areas of pain for the patient. The patient recently obtain new orthotics avoid issues and Bryan undergo reevaluation with Dr. [name] as discussed. The patient is continuing his Neurontin hydrocodone acetaminophen and fentanyl patch with pain being fairly well-controlled at this time. We discussed patient's condition including interventional treatment and Bryan avoid interventional treatment at the present time. We Bryan continue to observe patient's response to present treatment regimen and we'll consider patient for modification of treatment should settle to be necessary. Patient denies any recent trauma change in events of daily living the call significant change in symptomatology. The patient Bryan follow-up with Dr. [name] as discussed and we Bryan continue medications as prescribed. All and agreement suggested treatment plan    Review of Systems     Objective:   Physical Exam  There was tenderness to palpation of the paraspinal muscular region cervical region cervical facet region palpation which be produced pain of moderate degree with limited range of motion of the cervical spine flexion extension rotation and lateral bending. Patient appeared to be unremarkable Spurling's maneuver. There was tenderness over the And occipitalis musculature region of mild to moderate degree with palpation over the thoracic facet thoracic paraspinal must reason reproducing pain of moderate degree with  moderate muscle spasms noted in the thoracic paraspinal musculature region. Patient appeared to be unremarkable Spurling's maneuver and was with decreased grip strength with subluxation of the digits of the hand noted. There was no increased warmth and erythema in the region of the hand. Patient was a slightly positive findings with Tinel and Phalen's maneuver were performed on the left and right upper extremities. The patient was with decreased grip strength. Palpation over the thoracic facet thoracic paraspinal musculature region was attends to palpation of moderate degree with moderate muscle spasms with no crepitus of the thoracic region noted. Palpation over the lumbar paraspinal musculature region lumbar facet region was attends to palpation of moderate degree with lateral bending rotation extension and palpation of the lumbar facets reproduced pain of moderate degree. There was evidence of moderate muscle spasms of the lumbar paraspinal musculature region. Palpation over the PSIS and PII S region reproduces moderate discomfort as well as mild tenderness to palpation of the greater trochanteric region and iliotibial band region straight leg raise was tolerates approximately 20 No definite sensory deficit dermatomal speech detected. There was negative clonus negative Homans. Abdomen was nontender with no costovertebral tenderness noted      Assessment & Plan:    Degenerative disc disease lumbar spine Asymmetric bulging L5-S1 severe right foraminal stenosis probable right L5 nerve root encroachment, left foraminal stenosis, annular fissure L5-S1, asymmetric left foraminal stenosis at L3-4 with indeterminate left renal lesion likely a hemorrhagic cyst. A small papillary tumor could have a similar appearance.  Lumbar facet syndrome  Lumbar stenosis with neurogenic claudication  Rheumatoid arthritis  Sacroiliac joint dysfunction      PLAN   Continue present  Medication Neurontin   hydrocodone acetaminophen and fentanyl patch  F/U PCP  C Doss for evaliation of  BP and general medical  condition  F/U surgical evaluation. May consider pending follow-up evaluations  F/U neurological evaluation. May consider pending follow-up evaluations  F/U Dr. [name] as planned. Patient recently received new orthotics from Dr. [name]  F/U rheumatological evaluation  May consider radiofrequency rhizolysis or intraspinal procedures pending response to present treatment and F/U evaluation   Please call pain management for any concerns you may have prior to scheduled return appointment

## 2015-07-07 DIAGNOSIS — M25331 Other instability, right wrist: Secondary | ICD-10-CM | POA: Diagnosis not present

## 2015-07-09 ENCOUNTER — Other Ambulatory Visit: Payer: Medicare Other

## 2015-07-09 DIAGNOSIS — E291 Testicular hypofunction: Secondary | ICD-10-CM | POA: Diagnosis not present

## 2015-07-09 DIAGNOSIS — N400 Enlarged prostate without lower urinary tract symptoms: Secondary | ICD-10-CM | POA: Diagnosis not present

## 2015-07-09 NOTE — Addendum Note (Signed)
Addended by: Lestine Box on: 07/09/2015 09:23 AM   Modules accepted: Orders

## 2015-07-10 ENCOUNTER — Telehealth: Payer: Self-pay | Admitting: *Deleted

## 2015-07-10 ENCOUNTER — Telehealth: Payer: Self-pay

## 2015-07-10 LAB — TESTOSTERONE: Testosterone: 315 ng/dL — ABNORMAL LOW (ref 348–1197)

## 2015-07-10 LAB — ESTRADIOL: Estradiol: 12.9 pg/mL (ref 7.6–42.6)

## 2015-07-10 LAB — HEMATOCRIT: Hematocrit: 35 % — ABNORMAL LOW (ref 37.5–51.0)

## 2015-07-10 NOTE — Telephone Encounter (Signed)
Spoke with patient and he understands about the appointments a follow up then a testopel and was transferred up front to make appointments.

## 2015-07-10 NOTE — Telephone Encounter (Signed)
-----   Message from Nori Riis, PA-C sent at 07/10/2015  8:46 AM EST ----- We need to add TSH and a PSA to his blood work.

## 2015-07-10 NOTE — Telephone Encounter (Signed)
Spoke with wife because patient was on a phone call at the time I called. Let her know that the patient needs to call me back regarding some upcoming appointments that the patient needs. Patient to call back after lunch.

## 2015-07-10 NOTE — Telephone Encounter (Signed)
Labs added.

## 2015-07-14 ENCOUNTER — Encounter: Payer: Self-pay | Admitting: Nurse Practitioner

## 2015-07-14 ENCOUNTER — Ambulatory Visit (INDEPENDENT_AMBULATORY_CARE_PROVIDER_SITE_OTHER): Payer: Medicare Other | Admitting: Nurse Practitioner

## 2015-07-14 VITALS — BP 132/70 | HR 68 | Temp 97.4°F | Resp 14 | Ht 71.0 in | Wt 203.8 lb

## 2015-07-14 DIAGNOSIS — M05771 Rheumatoid arthritis with rheumatoid factor of right ankle and foot without organ or systems involvement: Secondary | ICD-10-CM | POA: Diagnosis not present

## 2015-07-14 DIAGNOSIS — M1460 Charcot's joint, unspecified site: Secondary | ICD-10-CM | POA: Diagnosis not present

## 2015-07-14 DIAGNOSIS — K589 Irritable bowel syndrome without diarrhea: Secondary | ICD-10-CM | POA: Diagnosis not present

## 2015-07-14 DIAGNOSIS — M79672 Pain in left foot: Secondary | ICD-10-CM | POA: Diagnosis not present

## 2015-07-14 DIAGNOSIS — M069 Rheumatoid arthritis, unspecified: Secondary | ICD-10-CM | POA: Diagnosis not present

## 2015-07-14 DIAGNOSIS — M05772 Rheumatoid arthritis with rheumatoid factor of left ankle and foot without organ or systems involvement: Secondary | ICD-10-CM | POA: Diagnosis not present

## 2015-07-14 MED ORDER — POLYETHYLENE GLYCOL 3350 17 GM/SCOOP PO POWD: ORAL | Status: DC

## 2015-07-14 NOTE — Progress Notes (Signed)
Pre visit review using our clinic review tool, if applicable. No additional management support is needed unless otherwise documented below in the visit note. 

## 2015-07-14 NOTE — Patient Instructions (Signed)
1 capful in 4-8 oz of water daily to help with bowel movements.   Call me if you need me!

## 2015-07-14 NOTE — Progress Notes (Signed)
Patient ID: Bryan [name], male    DOB: 15-Apr-1941  Age: 75 y.o. MRN: [id number]  CC: Irritable Bowel Syndrome   HPI Bryan [name] presents for CC of IBS.   1) IBS- both Constipation due to the pain medications  Diarrhea- intermittently  OTC loperamide 2 mg helpful  Last colonoscopy a few years ago he reports before he moved up here [name] in [place]  Pt reports he would like to try Miralax (past job was as a Software engineer)  History [name] has a past medical history of Coronary artery disease; Hyperlipidemia; H/O thoracic aortic aneurysm repair; Valvular heart disease; Paroxysmal a-fib ([place]); Hypertension; Arrhythmia; Glaucoma; Arthritis; Depression; Allergy; History of blood transfusion; History of chicken pox; and Chronic back pain.   He has past surgical history that includes Thoracic aortic aneurysm repair (2010); Aortic valve replacement; Cholecystectomy (2010); and Appendectomy (2010).   His family history includes Asthma in his mother; Heart attack (age of onset: 92) in his father; Heart failure in his mother; Hypertension in his mother and sister.He reports that he quit smoking about 34 years ago. His smoking use included Cigarettes. He has a 32 pack-year smoking history. He does not have any smokeless tobacco history on file. He reports that he drinks about 0.6 oz of alcohol per week. He reports that he does not use illicit drugs.  Outpatient Prescriptions Prior to Visit  Medication Sig Dispense Refill  . ALPHAGAN P 0.1 % SOLN Place 1 drop into both eyes daily.     Marland Kitchen aspirin 325 MG tablet Take 325 mg by mouth daily.    . cetirizine (ZYRTEC) 10 MG tablet Take 10 mg by mouth daily.    . Cholecalciferol (VITAMIN D) 400 UNITS capsule Take 400 Units by mouth daily.    Marland Kitchen donepezil (ARICEPT) 10 MG tablet Take 10 mg by mouth at bedtime.    . fenofibrate 160 MG tablet TAKE ONE TABLET BY MOUTH DAILY 90 tablet 2  . fentaNYL (DURAGESIC - DOSED MCG/HR) 25 MCG/HR  patch Apply  1 patch to skin every 3 days if tolerated 10 patch 0  . FLUoxetine (PROZAC) 20 MG capsule Take 1 capsule (20 mg total) by mouth daily. 90 capsule 1  . folic acid (FOLVITE) 1 MG tablet Take 1 mg by mouth 2 (two) times daily.     Marland Kitchen gabapentin (NEURONTIN) 800 MG tablet Take 1 tablet (800 mg total) by mouth 3 (three) times daily as needed. 270 tablet 3  . HYDROcodone-acetaminophen (NORCO/VICODIN) 5-325 MG tablet Limit  2-4 tablets by mouth per day for breakthrough pain while wearing fentanyl patch if tolerated 120 tablet 0  . hydroxychloroquine (PLAQUENIL) 200 MG tablet Take 200 mg by mouth daily.    Marland Kitchen lisinopril (PRINIVIL,ZESTRIL) 20 MG tablet TAKE 1 TABLET BY MOUTH DAILY 90 tablet 3  . lisinopril-hydrochlorothiazide (PRINZIDE,ZESTORETIC) 20-25 MG per tablet TAKE ONE TABLET BY MOUTH DAILY 90 tablet 3  . magnesium oxide (MAG-OX) 400 MG tablet Take by mouth.    . naproxen (NAPROSYN) 500 MG tablet Take 1 tablet (500 mg total) by mouth 2 (two) times daily with a meal. 60 tablet 0  . simvastatin (ZOCOR) 40 MG tablet Take by mouth.    . tamsulosin (FLOMAX) 0.4 MG CAPS capsule Take 1 capsule (0.4 mg total) by mouth daily. 90 capsule 1  . traZODone (DESYREL) 50 MG tablet Take 50-100 mg by mouth at bedtime. For sleep    . vitamin B-12 (CYANOCOBALAMIN) 1000 MCG tablet Take 1,000 mcg by  mouth daily.     No facility-administered medications prior to visit.    ROS Review of Systems  Constitutional: Negative for fever, chills, diaphoresis and fatigue.  Gastrointestinal: Positive for diarrhea and constipation. Negative for nausea, vomiting, abdominal pain, blood in stool, abdominal distention, anal bleeding and rectal pain.    Objective:  BP 132/70 mmHg  Pulse 68  Temp(Src) 97.4 F (36.3 C) (Oral)  Resp 14  Ht 5\' 11"  (1.803 m)  Wt 203 lb 12.8 oz (92.443 kg)  BMI 28.44 kg/m2  SpO2 97%  Physical Exam  Constitutional: He is oriented to person, place, and time. He appears well-developed  and well-nourished. No distress.  HENT:  Head: Normocephalic and atraumatic.  Right Ear: External ear normal.  Left Ear: External ear normal.  Eyes: No scleral icterus.  Cardiovascular: Normal rate and regular rhythm.   Pulmonary/Chest: Effort normal and breath sounds normal.  Abdominal: Soft. Bowel sounds are normal. He exhibits no distension and no mass. There is no tenderness. There is no rebound and no guarding.  Neurological: He is alert and oriented to person, place, and time.  Skin: Skin is warm and dry. No rash noted. He is not diaphoretic.  Psychiatric: He has a normal mood and affect. His behavior is normal. Judgment and thought content normal.      Assessment & Plan:   There are no diagnoses linked to this encounter. I am having Bryan [name] maintain his aspirin, vitamin 0000000, folic acid, Vitamin D, cetirizine, hydroxychloroquine, lisinopril-hydrochlorothiazide, donepezil, lisinopril, magnesium oxide, ALPHAGAN P, fenofibrate, traZODone, simvastatin, tamsulosin, FLUoxetine, gabapentin, naproxen, HYDROcodone-acetaminophen, and fentaNYL.  No orders of the defined types were placed in this encounter.     Follow-up: No Follow-up on file.

## 2015-07-15 ENCOUNTER — Ambulatory Visit (INDEPENDENT_AMBULATORY_CARE_PROVIDER_SITE_OTHER): Payer: Medicare Other | Admitting: Urology

## 2015-07-15 ENCOUNTER — Encounter: Payer: Self-pay | Admitting: Urology

## 2015-07-15 VITALS — BP 129/55 | HR 67 | Ht 71.0 in | Wt 206.5 lb

## 2015-07-15 DIAGNOSIS — N529 Male erectile dysfunction, unspecified: Secondary | ICD-10-CM

## 2015-07-15 DIAGNOSIS — N138 Other obstructive and reflux uropathy: Secondary | ICD-10-CM

## 2015-07-15 DIAGNOSIS — K589 Irritable bowel syndrome without diarrhea: Secondary | ICD-10-CM | POA: Insufficient documentation

## 2015-07-15 DIAGNOSIS — E291 Testicular hypofunction: Secondary | ICD-10-CM

## 2015-07-15 DIAGNOSIS — N401 Enlarged prostate with lower urinary tract symptoms: Secondary | ICD-10-CM

## 2015-07-15 DIAGNOSIS — N528 Other male erectile dysfunction: Secondary | ICD-10-CM

## 2015-07-15 NOTE — Progress Notes (Signed)
11:10 AM   Bryan [name] 1940-12-11 [id number]  Referring provider: Rubbie Battiest, NP [id number] [place], [place] [address]  Chief Complaint  Patient presents with  . Hypogonadism    6 mo follow up for exam    HPI: Patient is a 75 year old white male with hypogonadism, erectile dysfunction and BPH with LUTS who presents today for a 6 months follow up.    Hypogonadism Patient is experiencing a decrease in libido, a lack of energy, a decrease in strength,  erections being less strong, a recent deterioration in an ability to play sports, falling asleep after dinner and a recent deterioration in their work performance.  This is indicated by his responses to the ADAM questionnaire.  His pretreatment testosterone level was 26 ng/dL on 01/17/2014.  He is currently managing his hypogonadism with Testopel insertion.  His last insertion was on 03/21/2015.        Androgen Deficiency in the Aging Male      07/15/15 1000       Androgen Deficiency in the Aging Male   Do you have a decrease in libido (sex drive) Yes     Do you have lack of energy Yes     Do you have a decrease in strength and/or endurance Yes     Have you lost height No     Have you noticed a decreased "enjoyment of life" No     Are you sad and/or grumpy No     Are your erections less strong Yes     Have you noticed a recent deterioration in your ability to play sports Yes     Are you falling asleep after dinner Yes     Has there been a recent deterioration in your work performance Yes       Erectile dysfunction His SHIM score is 18, which is mild ED.   He has been having difficulty with erections for the last several years.   His major complaint is achieving an erection.  His libido is diminished.   His risk factors for ED are age, BPH, hypogonadism, CAD and HTN.  He denies any painful erections or curvatures with his erections.        SHIM      07/15/15 1046       SHIM: Over the last  6 months:   How do you rate your confidence that you could get and keep an erection? Moderate     When you had erections with sexual stimulation, how often were your erections hard enough for penetration (entering your partner)? Most Times (much more than half the time)     During sexual intercourse, how often were you able to maintain your erection after you had penetrated (entered) your partner? Slightly Difficult     During sexual intercourse, how difficult was it to maintain your erection to completion of intercourse? Slightly Difficult     When you attempted sexual intercourse, how often was it satisfactory for you? Difficult     SHIM Total Score   SHIM 18        Score: 1-7 Severe ED 8-11 Moderate ED 12-16 Mild-Moderate ED 17-21 Mild ED 22-25 No ED   BPH WITH LUTS His IPSS score today is 9, which is moderate lower urinary tract symptomatology. He is mixed with his quality life due to his urinary symptoms.   His previous IPSS score was 10/2.  His previous PVR is 28 mL.  He denies any dysuria, hematuria or suprapubic pain.  His has had laser bladder outlet procedure in 05/05/2013.  He also denies any recent fevers, chills, nausea or vomiting.   He does not have a family history of PCa.      IPSS      07/15/15 1000       International Prostate Symptom Score   How often have you had the sensation of not emptying your bladder? Less than 1 in 5     How often have you had to urinate less than every two hours? Less than 1 in 5 times     How often have you found you stopped and started again several times when you urinated? Less than 1 in 5 times     How often have you found it difficult to postpone urination? Less than 1 in 5 times     How often have you had a weak urinary stream? Less than half the time     How often have you had to strain to start urination? Less than 1 in 5 times     How many times did you typically get up at night to urinate? 2 Times     Total IPSS Score 9      Quality of Life due to urinary symptoms   If you were to spend the rest of your life with your urinary condition just the way it is now how would you feel about that? Mixed        Score:  1-7 Mild 8-19 Moderate 20-35 Severe     PMH: Past Medical History  Diagnosis Date  . Coronary artery disease   . Hyperlipidemia   . H/O thoracic aortic aneurysm repair   . Valvular heart disease   . Paroxysmal a-fib ([place])   . Hypertension   . Arrhythmia   . Glaucoma   . Arthritis   . Depression   . Allergy   . History of blood transfusion   . History of chicken pox   . Chronic back pain     Surgical History: Past Surgical History  Procedure Laterality Date  . Thoracic aortic aneurysm repair  2010  . Aortic valve replacement    . Cholecystectomy  2010  . Appendectomy  2010    Home Medications:    Medication List       This list is accurate as of: 07/15/15 11:10 AM.  Always use your most recent med list.               ALPHAGAN P 0.1 % Soln  Generic drug:  brimonidine  Place 1 drop into both eyes daily.     aspirin 325 MG tablet  Take 325 mg by mouth daily.     cetirizine 10 MG tablet  Commonly known as:  ZYRTEC  Take 10 mg by mouth daily.     donepezil 10 MG tablet  Commonly known as:  ARICEPT  Take 10 mg by mouth at bedtime.     fenofibrate 160 MG tablet  TAKE ONE TABLET BY MOUTH DAILY     fentaNYL 25 MCG/HR patch  Commonly known as:  DURAGESIC - dosed mcg/hr  Apply  1 patch to skin every 3 days if tolerated     FLUoxetine 20 MG capsule  Commonly known as:  PROZAC  Take 1 capsule (20 mg total) by mouth daily.     folic acid 1 MG tablet  Commonly known as:  FOLVITE  Take 1  mg by mouth 2 (two) times daily.     gabapentin 800 MG tablet  Commonly known as:  NEURONTIN  Take 1 tablet (800 mg total) by mouth 3 (three) times daily as needed.     HYDROcodone-acetaminophen 5-325 MG tablet  Commonly known as:  NORCO/VICODIN  Limit  2-4 tablets by mouth per  day for breakthrough pain while wearing fentanyl patch if tolerated     hydroxychloroquine 200 MG tablet  Commonly known as:  PLAQUENIL  Take 200 mg by mouth daily.     lisinopril 20 MG tablet  Commonly known as:  PRINIVIL,ZESTRIL  TAKE 1 TABLET BY MOUTH DAILY     lisinopril-hydrochlorothiazide 20-25 MG tablet  Commonly known as:  PRINZIDE,ZESTORETIC  TAKE ONE TABLET BY MOUTH DAILY     magnesium oxide 400 MG tablet  Commonly known as:  MAG-OX  Take by mouth.     naproxen 500 MG tablet  Commonly known as:  NAPROSYN  Take 1 tablet (500 mg total) by mouth 2 (two) times daily with a meal.     polyethylene glycol powder powder  Commonly known as:  GLYCOLAX/MIRALAX  1 capful daily in 4-8 oz of liquid     simvastatin 40 MG tablet  Commonly known as:  ZOCOR  Take by mouth.     tamsulosin 0.4 MG Caps capsule  Commonly known as:  FLOMAX  Take 1 capsule (0.4 mg total) by mouth daily.     traZODone 50 MG tablet  Commonly known as:  DESYREL  Take 50-100 mg by mouth at bedtime. For sleep     vitamin B-12 1000 MCG tablet  Commonly known as:  CYANOCOBALAMIN  Take 1,000 mcg by mouth daily.     Vitamin D 400 units capsule  Take 400 Units by mouth daily.        Allergies:  Allergies  Allergen Reactions  . Demerol [Meperidine]     hives  . Penicillins     Hives     Family History: Family History  Problem Relation Age of Onset  . Heart failure Mother   . Hypertension Mother   . Asthma Mother   . Heart attack Father 41    MI  . Hypertension Sister     Social History:  reports that he quit smoking about 34 years ago. His smoking use included Cigarettes. He has a 32 pack-year smoking history. He does not have any smokeless tobacco history on file. He reports that he drinks about 0.6 oz of alcohol per week. He reports that he does not use illicit drugs.  ROS: UROLOGY Frequent Urination?: No Hard to postpone urination?: No Burning/pain with urination?: No Get up at  night to urinate?: Yes Leakage of urine?: No Urine stream starts and stops?: Yes Trouble starting stream?: No Do you have to strain to urinate?: No Blood in urine?: No Urinary tract infection?: No Sexually transmitted disease?: No Injury to kidneys or bladder?: No Painful intercourse?: No Weak stream?: No Erection problems?: No Penile pain?: No  Gastrointestinal Nausea?: No Vomiting?: No Indigestion/heartburn?: No Diarrhea?: No Constipation?: No  Constitutional Fever: No Night sweats?: Yes Weight loss?: No Fatigue?: No  Skin Skin rash/lesions?: No Itching?: Yes  Eyes Blurred vision?: No Double vision?: No  Ears/Nose/Throat Sore throat?: No Sinus problems?: No  Hematologic/Lymphatic Swollen glands?: No Easy bruising?: No  Cardiovascular Leg swelling?: No Chest pain?: No  Respiratory Cough?: No Shortness of breath?: No  Endocrine Excessive thirst?: No  Musculoskeletal Back pain?: No Joint pain?:  Yes  Neurological Headaches?: No Dizziness?: No  Psychologic Depression?: No Anxiety?: No  Physical Exam: BP 129/55 mmHg  Pulse 67  Ht 5\' 11"  (1.803 m)  Wt 206 lb 8 oz (93.668 kg)  BMI 28.81 kg/m2  GU: Patient with uncircumcised phallus. Foreskin easily retracted  Urethral meatus is patent.  No penile discharge. No penile lesions or rashes. Scrotum without lesions, cysts, rashes and/or edema.  Testicles are located scrotally bilaterally. No masses are appreciated in the testicles. Left and right epididymis are normal. Rectal: Patient with  normal sphincter tone. Perineum without scarring or rashes. No rectal masses are appreciated. Prostate is approximately 30 grams, no nodules are appreciated. Seminal vesicles are normal.   Laboratory Data: Lab Results  Component Value Date   WBC 3.1* 05/30/2015   HGB 11.6* 05/30/2015   HCT 35.0* 07/09/2015   MCV 94.2 05/30/2015   PLT 113.0* 05/30/2015    Lab Results  Component Value Date   CREATININE 1.50  05/30/2015    PSA history:  <0.1 ng/mL on 01/17/2014    0.2 ng/mL on 08/07/2014    0.3 ng/mL on 02/17/2015    0.2 ng/mL on 07/09/2015  Lab Results  Component Value Date   TESTOSTERONE 315* 07/09/2015   Assessment & Plan:    1. Hypogonadism male:   Patient's hypogonadism is managed with Testopel insertion.  He is doing well with this therapy would like to continue.  He is scheduled for tomorrow.  2. BPH (benign prostatic hyperplasia) with LUTS:   Patient's IPSS score is 9/3.  He will continue his tamsulosin 0.4 mg daily.   He will follow up in 6 months for a PSA, exam and an IPSS score.    3. Erectile dysfunction:   SHIM score is 18.  We will continue to monitor.  He will RTC in 6 months for a SHIM score and exam.     Return for Testopel insertion tomorrow.  Zara Council, [facility] [address], [place], [place] [address] [phone]

## 2015-07-15 NOTE — Assessment & Plan Note (Signed)
[facility] Willing to try to regulate between constipation from pain meds and diarrhea. FU prn worsening/failure to improve.

## 2015-07-16 ENCOUNTER — Ambulatory Visit (INDEPENDENT_AMBULATORY_CARE_PROVIDER_SITE_OTHER): Payer: Medicare Other | Admitting: Urology

## 2015-07-16 ENCOUNTER — Encounter: Payer: Self-pay | Admitting: Urology

## 2015-07-16 VITALS — BP 114/62 | HR 70 | Ht 71.0 in | Wt 205.4 lb

## 2015-07-16 DIAGNOSIS — E291 Testicular hypofunction: Secondary | ICD-10-CM | POA: Diagnosis not present

## 2015-07-16 MED ORDER — TESTOSTERONE 75 MG IL PLLT
75.0000 mg | PELLET | Freq: Once | Status: AC
  Administered 2015-07-16: 75 mg

## 2015-07-16 NOTE — Progress Notes (Signed)
This is a 75 -year-old male with hypogonadism and he is managed with Testopel. He presents today for Testopel insertion.  Patient is placed on the exam table in the right lateral jackknife position.  Identified upper outer quadrant of hip for insertion; prepped area with Betadine and injected 10 cc's of Lidocaine 1% with Epinephrine to nesthetize superficially and distally along trocar tract.  Made 3 mm incision using 15 blade of scalpel; trocar with sharp ended stylet was inserted into subcutaneous tissue in line with femur. Sharp stylet was withdrawn and 6 pellets were placed into trocar well. Testopel pellets advanced into tissue using blunt ended stylet. Trocar removed and incision closed using 6 Steri-Strips. Cleansed area to remove Betadine and covered Steri-Strips with outer Band-Aid.  Careful inspection of insertion is done and patient informed of post procedure instructions.  He will return in one month for serum testosterone.

## 2015-07-18 ENCOUNTER — Other Ambulatory Visit: Payer: Self-pay | Admitting: Pain Medicine

## 2015-07-22 LAB — TSH: TSH: 1.42 u[IU]/mL (ref 0.450–4.500)

## 2015-07-22 LAB — SPECIMEN STATUS REPORT

## 2015-07-22 LAB — PSA: Prostate Specific Ag, Serum: 0.2 ng/mL (ref 0.0–4.0)

## 2015-07-29 ENCOUNTER — Encounter: Payer: Self-pay | Admitting: Pain Medicine

## 2015-07-29 ENCOUNTER — Ambulatory Visit: Payer: Medicare Other | Attending: Pain Medicine | Admitting: Pain Medicine

## 2015-07-29 VITALS — BP 140/63 | HR 59 | Temp 97.6°F | Resp 16 | Ht 71.0 in | Wt 195.0 lb

## 2015-07-29 DIAGNOSIS — M533 Sacrococcygeal disorders, not elsewhere classified: Secondary | ICD-10-CM | POA: Diagnosis not present

## 2015-07-29 DIAGNOSIS — M4726 Other spondylosis with radiculopathy, lumbar region: Secondary | ICD-10-CM

## 2015-07-29 DIAGNOSIS — M069 Rheumatoid arthritis, unspecified: Secondary | ICD-10-CM | POA: Diagnosis not present

## 2015-07-29 DIAGNOSIS — M5416 Radiculopathy, lumbar region: Secondary | ICD-10-CM

## 2015-07-29 DIAGNOSIS — M4806 Spinal stenosis, lumbar region: Secondary | ICD-10-CM | POA: Diagnosis not present

## 2015-07-29 DIAGNOSIS — M1711 Unilateral primary osteoarthritis, right knee: Secondary | ICD-10-CM

## 2015-07-29 DIAGNOSIS — M5126 Other intervertebral disc displacement, lumbar region: Secondary | ICD-10-CM | POA: Insufficient documentation

## 2015-07-29 DIAGNOSIS — M170 Bilateral primary osteoarthritis of knee: Secondary | ICD-10-CM | POA: Insufficient documentation

## 2015-07-29 DIAGNOSIS — M47817 Spondylosis without myelopathy or radiculopathy, lumbosacral region: Secondary | ICD-10-CM | POA: Diagnosis not present

## 2015-07-29 DIAGNOSIS — M48061 Spinal stenosis, lumbar region without neurogenic claudication: Secondary | ICD-10-CM

## 2015-07-29 DIAGNOSIS — M47816 Spondylosis without myelopathy or radiculopathy, lumbar region: Secondary | ICD-10-CM

## 2015-07-29 DIAGNOSIS — M542 Cervicalgia: Secondary | ICD-10-CM | POA: Diagnosis present

## 2015-07-29 DIAGNOSIS — Z96652 Presence of left artificial knee joint: Secondary | ICD-10-CM

## 2015-07-29 DIAGNOSIS — M47812 Spondylosis without myelopathy or radiculopathy, cervical region: Secondary | ICD-10-CM

## 2015-07-29 DIAGNOSIS — M5136 Other intervertebral disc degeneration, lumbar region: Secondary | ICD-10-CM | POA: Insufficient documentation

## 2015-07-29 DIAGNOSIS — M549 Dorsalgia, unspecified: Secondary | ICD-10-CM | POA: Diagnosis present

## 2015-07-29 MED ORDER — HYDROCODONE-ACETAMINOPHEN 5-325 MG PO TABS: ORAL_TABLET | ORAL | Status: DC

## 2015-07-29 MED ORDER — FENTANYL 25 MCG/HR TD PT72: MEDICATED_PATCH | TRANSDERMAL | Status: DC

## 2015-07-29 NOTE — Patient Instructions (Addendum)
PLAN   Continue present  Medication Neurontin  hydrocodone acetaminophen and fentanyl patch  Geniculate nerve blocks of knee to be performed at time of return appointment  F/U PCP  Bryan [name] for evaliation of  BP and general medical  condition  F/U surgical evaluation. May consider pending follow-up evaluations  F/U neurological evaluation. May consider pending follow-up evaluations  F/U Dr. [name] as needed and as discussed  F/U rheumatological evaluation as discussed  May consider radiofrequency rhizolysis or intraspinal procedures pending response to present treatment and F/U evaluation   Please call pain management for any concerns you may have prior to scheduled return appointmentTrigger Point Injection Trigger points are areas where you have muscle pain. A trigger point injection is a shot given in the trigger point to relieve that pain. A trigger point might feel like a knot in your muscle. It hurts to press on a trigger point. Sometimes the pain spreads out (radiates) to other parts of the body. For example, pressing on a trigger point in your shoulder might cause pain in your arm or neck. You might have one trigger point. Or, you might have more than one. People often have trigger points in their upper back and lower back. They also occur often in the neck and shoulders. Pain from a trigger point lasts for a long time. It can make it hard to keep moving. You might not be able to do the exercise or physical therapy that could help you deal with the pain. A trigger point injection may help. It does not work for everyone. But, it may relieve your pain for a few days or a few months. A trigger point injection does not cure long-lasting (chronic) pain. LET YOUR CAREGIVER KNOW ABOUT:  Any allergies (especially to latex, lidocaine, or steroids).  Blood-thinning medicines that you take. These drugs can lead to bleeding or bruising after an injection. They  include:  Aspirin.  Ibuprofen.  Clopidogrel.  Warfarin.  Other medicines you take. This includes all vitamins, herbs, eyedrops, over-the-counter medicines, and creams.  Use of steroids.  Recent infections.  Past problems with numbing medicines.  Bleeding problems.  Surgeries you have had.  Other health problems. RISKS AND COMPLICATIONS A trigger point injection is a safe treatment. However, problems may develop, such as:  Minor side effects usually go away in 1 to 2 days. These may include:  Soreness.  Bruising.  Stiffness.  More serious problems are rare. But, they may include:  Bleeding under the skin (hematoma).  Skin infection.  Breaking off of the needle under your skin.  Lung puncture.  The trigger point injection may not work for you. BEFORE THE PROCEDURE You may need to stop taking any medicine that thins your blood. This is to prevent bleeding and bruising. Usually these medicines are stopped several days before the injection. No other preparation is needed. PROCEDURE  A trigger point injection can be given in your caregiver's office or in a clinic. Each injection takes 2 minutes or less.  Your caregiver will feel for trigger points. The caregiver may use a marker to circle the area for the injection.  The skin over the trigger point will be washed with a germ-killing (antiseptic) solution.  The caregiver pinches the spot for the injection.  Then, a very thin needle is used for the shot. You may feel pain or a twitching feeling when the needle enters the trigger point.  A numbing solution may be injected into the trigger point. Sometimes a  drug to keep down swelling, redness, and warmth (inflammation) is also injected.  Your caregiver moves the needle around the trigger zone until the tightness and twitching goes away.  After the injection, your caregiver may put gentle pressure over the injection site.  Then it is covered with a  bandage. AFTER THE PROCEDURE  You can go right home after the injection.  The bandage can be taken off after a few hours.  You may feel sore and stiff for 1 to 2 days.  Go back to your regular activities slowly. Your caregiver may ask you to stretch your muscles. Do not do anything that takes extra energy for a few days.  Follow your caregiver's instructions to manage and treat other pain.   This information is not intended to replace advice given to you by your health care provider. Make sure you discuss any questions you have with your health care provider.   Document Released: 04/29/2011 Document Revised: 09/04/2012 Document Reviewed: 04/29/2011 [employer] Interactive Patient Education 2016 [employer] Pain Management Discharge Instructions  General Discharge Instructions :  If you need to reach your doctor call: Monday-Friday 8:00 am - 4:00 pm at [phone] or toll free [phone].  After clinic hours [phone] to have operator reach doctor.  Bring all of your medication bottles to all your appointments in the pain clinic.  To cancel or reschedule your appointment with Pain Management please remember to call 24 hours in advance to avoid a fee.  Refer to the educational materials which you have been given on: General Risks, I had my Procedure. Discharge Instructions, Post Sedation.  Post Procedure Instructions:  The drugs you were given will stay in your system until tomorrow, so for the next 24 hours you should not drive, make any legal decisions or drink any alcoholic beverages.  You may eat anything you prefer, but it is better to start with liquids then soups and crackers, and gradually work up to solid foods.  Please notify your doctor immediately if you have any unusual bleeding, trouble breathing or pain that is not related to your normal pain.  Depending on the type of procedure that was done, some parts of your body may feel week and/or numb.  This usually  clears up by tonight or the next day.  Walk with the use of an assistive device or accompanied by an adult for the 24 hours.  You may use ice on the affected area for the first 24 hours.  Put ice in a Ziploc bag and cover with a towel and place against area 15 minutes on 15 minutes off.  You may switch to heat after 24 hours.GENERAL RISKS AND COMPLICATIONS  What are the risk, side effects and possible complications? Generally speaking, most procedures are safe.  However, with any procedure there are risks, side effects, and the possibility of complications.  The risks and complications are dependent upon the sites that are lesioned, or the type of nerve block to be performed.  The closer the procedure is to the spine, the more serious the risks are.  Great care is taken when placing the radio frequency needles, block needles or lesioning probes, but sometimes complications can occur.  Infection: Any time there is an injection through the skin, there is a risk of infection.  This is why sterile conditions are used for these blocks.  There are four possible types of infection.  Localized skin infection.  Central Nervous System Infection-This can be in the form of Meningitis,  which can be deadly.  Epidural Infections-This can be in the form of an epidural abscess, which can cause pressure inside of the spine, causing compression of the spinal cord with subsequent paralysis. This would require an emergency surgery to decompress, and there are no guarantees that the patient would recover from the paralysis.  Discitis-This is an infection of the intervertebral discs.  It occurs in about 1% of discography procedures.  It is difficult to treat and it may lead to surgery.        2. Pain: the needles have to go through skin and soft tissues, will cause soreness.       3. Damage to internal structures:  The nerves to be lesioned may be near blood vessels or    other nerves which can be potentially  damaged.       4. Bleeding: Bleeding is more common if the patient is taking blood thinners such as  aspirin, Coumadin, Ticiid, Plavix, etc., or if he/she have some genetic predisposition  such as hemophilia. Bleeding into the spinal canal can cause compression of the spinal  cord with subsequent paralysis.  This would require an emergency surgery to  decompress and there are no guarantees that the patient would recover from the  paralysis.       5. Pneumothorax:  Puncturing of a lung is a possibility, every time a needle is introduced in  the area of the chest or upper back.  Pneumothorax refers to free air around the  collapsed lung(s), inside of the thoracic cavity (chest cavity).  Another two possible  complications related to a similar event would include: Hemothorax and Chylothorax.   These are variations of the Pneumothorax, where instead of air around the collapsed  lung(s), you may have blood or chyle, respectively.       6. Spinal headaches: They may occur with any procedures in the area of the spine.       7. Persistent CSF (Cerebro-Spinal Fluid) leakage: This is a rare problem, but may occur  with prolonged intrathecal or epidural catheters either due to the formation of a fistulous  track or a dural tear.       8. Nerve damage: By working so close to the spinal cord, there is always a possibility of  nerve damage, which could be as serious as a permanent spinal cord injury with  paralysis.       9. Death:  Although rare, severe deadly allergic reactions known as "Anaphylactic  reaction" can occur to any of the medications used.      10. Worsening of the symptoms:  We can always make thing worse.  What are the chances of something like this happening? Chances of any of this occuring are extremely low.  By statistics, you have more of a chance of getting killed in a motor vehicle accident: while driving to the hospital than any of the above occurring .  Nevertheless, you should be aware that  they are possibilities.  In general, it is similar to taking a shower.  Everybody knows that you can slip, hit your head and get killed.  Does that mean that you should not shower again?  Nevertheless always keep in mind that statistics do not mean anything if you happen to be on the wrong side of them.  Even if a procedure has a 1 (one) in a 1,000,000 (million) chance of going wrong, it you happen to be that one..Also, keep in mind that by statistics,  you have more of a chance of having something go wrong when taking medications.  Who should not have this procedure? If you are on a blood thinning medication (e.g. Coumadin, Plavix, see list of "Blood Thinners"), or if you have an active infection going on, you should not have the procedure.  If you are taking any blood thinners, please inform your physician.  How should I prepare for this procedure?  Do not eat or drink anything at least six hours prior to the procedure.  Bring a driver with you .  It cannot be a taxi.  Come accompanied by an adult that can drive you back, and that is strong enough to help you if your legs get weak or numb from the local anesthetic.  Take all of your medicines the morning of the procedure with just enough water to swallow them.  If you have diabetes, make sure that you are scheduled to have your procedure done first thing in the morning, whenever possible.  If you have diabetes, take only half of your insulin dose and notify our nurse that you have done so as soon as you arrive at the clinic.  If you are diabetic, but only take blood sugar pills (oral hypoglycemic), then do not take them on the morning of your procedure.  You may take them after you have had the procedure.  Do not take aspirin or any aspirin-containing medications, at least eleven (11) days prior to the procedure.  They may prolong bleeding.  Wear loose fitting clothing that may be easy to take off and that you would not mind if it got stained  with Betadine or blood.  Do not wear any jewelry or perfume  Remove any nail coloring.  It will interfere with some of our monitoring equipment.  NOTE: Remember that this is not meant to be interpreted as a complete list of all possible complications.  Unforeseen problems may occur.  BLOOD THINNERS The following drugs contain aspirin or other products, which can cause increased bleeding during surgery and should not be taken for 2 weeks prior to and 1 week after surgery.  If you should need take something for relief of minor pain, you may take acetaminophen which is found in Tylenol,m Datril, Anacin-3 and Panadol. It is not blood thinner. The products listed below are.  Do not take any of the products listed below in addition to any listed on your instruction sheet.  A.P.Bryan or A.P.Bryan with Codeine Codeine Phosphate Capsules #3 Ibuprofen Ridaura  ABC compound Congesprin Imuran rimadil  Advil Cope Indocin Robaxisal  Alka-Seltzer Effervescent Pain Reliever and Antacid Coricidin or Coricidin-D  Indomethacin Rufen  Alka-Seltzer plus Cold Medicine Cosprin Ketoprofen S-A-Bryan Tablets  Anacin Analgesic Tablets or Capsules Coumadin Korlgesic Salflex  Anacin Extra Strength Analgesic tablets or capsules CP-2 Tablets Lanoril Salicylate  Anaprox Cuprimine Capsules Levenox Salocol  Anexsia-D Dalteparin Magan Salsalate  Anodynos Darvon compound Magnesium Salicylate Sine-off  Ansaid Dasin Capsules Magsal Sodium Salicylate  Anturane Depen Capsules Marnal Soma  APF Arthritis pain formula Dewitt's Pills Measurin Stanback  Argesic Dia-Gesic Meclofenamic Sulfinpyrazone  Arthritis Bayer Timed Release Aspirin Diclofenac Meclomen Sulindac  Arthritis pain formula Anacin Dicumarol Medipren Supac  Analgesic (Safety coated) Arthralgen Diffunasal Mefanamic Suprofen  Arthritis Strength Bufferin Dihydrocodeine Mepro Compound Suprol  Arthropan liquid Dopirydamole Methcarbomol with Aspirin Synalgos  ASA tablets/Enseals  Disalcid Micrainin Tagament  Ascriptin Doan's Midol Talwin  Ascriptin A/D Dolene Mobidin Tanderil  Ascriptin Extra Strength Dolobid Moblgesic Ticlid  Ascriptin with Codeine  Doloprin or Doloprin with Codeine Momentum Tolectin  Asperbuf Duoprin Mono-gesic Trendar  Aspergum Duradyne Motrin or Motrin IB Triminicin  Aspirin plain, buffered or enteric coated Durasal Myochrisine Trigesic  Aspirin Suppositories Easprin Nalfon Trillsate  Aspirin with Codeine Ecotrin Regular or Extra Strength Naprosyn Uracel  Atromid-S Efficin Naproxen Ursinus  Auranofin Capsules Elmiron Neocylate Vanquish  Axotal Emagrin Norgesic Verin  Azathioprine Empirin or Empirin with Codeine Normiflo Vitamin E  Azolid Emprazil Nuprin Voltaren  Bayer Aspirin plain, buffered or children's or timed BC Tablets or powders Encaprin Orgaran Warfarin Sodium  Buff-a-Comp Enoxaparin Orudis Zorpin  Buff-a-Comp with Codeine Equegesic Os-Cal-Gesic   Buffaprin Excedrin plain, buffered or Extra Strength Oxalid   Bufferin Arthritis Strength Feldene Oxphenbutazone   Bufferin plain or Extra Strength Feldene Capsules Oxycodone with Aspirin   Bufferin with Codeine Fenoprofen Fenoprofen Pabalate or Pabalate-SF   Buffets II Flogesic Panagesic   Buffinol plain or Extra Strength Florinal or Florinal with Codeine Panwarfarin   Buf-Tabs Flurbiprofen Penicillamine   Butalbital Compound Four-way cold tablets Penicillin   Butazolidin Fragmin Pepto-Bismol   Carbenicillin Geminisyn Percodan   Carna Arthritis Reliever Geopen Persantine   Carprofen Gold's salt Persistin   Chloramphenicol Goody's Phenylbutazone   Chloromycetin Haltrain Piroxlcam   Clmetidine heparin Plaquenil   Cllnoril Hyco-pap Ponstel   Clofibrate Hydroxy chloroquine Propoxyphen         Before stopping any of these medications, be sure to consult the physician who ordered them.  Some, such as Coumadin (Warfarin) are ordered to prevent or treat serious conditions such as "deep  thrombosis", "pumonary embolisms", and other heart problems.  The amount of time that you may need off of the medication may also vary with the medication and the reason for which you were taking it.  If you are taking any of these medications, please make sure you notify your pain physician before you undergo any procedures.

## 2015-07-29 NOTE — Progress Notes (Signed)
   Subjective:    Patient ID: Bryan [name], male    DOB: 01-29-1941, 75 y.o.   MRN: [id number]  HPI The patient is a 75 year old gentleman who returns to pain management for further evaluation and treatment of pain involving the neck entire back upper and lower extremity regions. The patient is with known degenerative changes of the cervical and lumbar regions. The patient is with diagnosis of rheumatoid arthritis. No status post total knee replacement on the left and states that he has significant pain involving the right knee. We will consider patient for geniculate nerve block of the knee on the right and we'll continue hydrocodone acetaminophen and fentanyl patch and Neurontin as prescribed. The patient will undergo follow-up surgical evaluation as discussed and as well. The patient is in agreement with suggested treatment plan. Geniculate nerve blocks of the knee to be performed at time return appointment as discussed.   Review of Systems     Objective:   Physical Exam There was tenderness of the splenius capitis and occipitalis muscles regions palpation which be produced mild to moderate discomfort. There was mild to moderate tenderness over the acromioclavicular and glenohumeral joint region. Patient appeared to be with unremarkable Spurling's maneuver. Palpation over the region of the cervical facet cervical paraspinal musculature region was a tennis of mild to moderate degree as well. Palpation of the thoracic region was with mild to moderate tenderness to palpation with no crepitus of the thoracic region noted. Lateral bending rotation extension and palpation of the lumbar facets reproduce moderate discomfort. There was tenderness over the PSIS and PII S region of mild degree. There was mild tenderness of the greater trochanteric region and iliotibial band region. There was well-healed surgical scar of the left knee. The right knee was without crepitus with increased pain with range of  motion maneuvers of the knee. There appeared to be negative anterior and posterior drawer signs without ballottement of the patella. No increased warmth or erythema of the knee noted. There was significant increase of pain with range of motion maneuvers of the knee. Straight leg raise was tolerates approximately 20. EHL strength was decreased. No sensory deficit or dermatomal dystrophy detected. There was negative clonus negative Homans. Abdomen nontender with no costovertebral tenderness noted.       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 A small papillary tumor could have a similar appearance.  Lumbar facet syndrome  Lumbar stenosis with neurogenic claudication  Rheumatoid arthritis  Sacroiliac joint dysfunction      PLAN   Continue present  Medication Neurontin  hydrocodone acetaminophen and fentanyl patch  Geniculate nerve blocks of knee to be performed at time of return appointment  F/U PCP  C Doss for evaliation of  BP and general medical  condition  F/U surgical evaluation. May consider pending follow-up evaluations  F/U neurological evaluation. May consider pending follow-up evaluations  F/U Dr. [name] as needed and as discussed  F/U rheumatological evaluation as discussed  May consider radiofrequency rhizolysis or intraspinal procedures pending response to present treatment and F/U evaluation   Please call pain management for any concerns you may have prior to scheduled return appointment

## 2015-07-29 NOTE — Progress Notes (Signed)
Safety precautions to be maintained throughout the outpatient stay will include: orient to surroundings, keep bed in low position, maintain call bell within reach at all times, provide assistance with transfer out of bed and ambulation.  

## 2015-07-30 DIAGNOSIS — M0579 Rheumatoid arthritis with rheumatoid factor of multiple sites without organ or systems involvement: Secondary | ICD-10-CM | POA: Diagnosis not present

## 2015-07-30 DIAGNOSIS — D709 Neutropenia, unspecified: Secondary | ICD-10-CM | POA: Diagnosis not present

## 2015-08-06 DIAGNOSIS — D709 Neutropenia, unspecified: Secondary | ICD-10-CM | POA: Diagnosis not present

## 2015-08-06 DIAGNOSIS — M25511 Pain in right shoulder: Secondary | ICD-10-CM | POA: Diagnosis not present

## 2015-08-06 DIAGNOSIS — G8929 Other chronic pain: Secondary | ICD-10-CM | POA: Diagnosis not present

## 2015-08-06 DIAGNOSIS — M0579 Rheumatoid arthritis with rheumatoid factor of multiple sites without organ or systems involvement: Secondary | ICD-10-CM | POA: Diagnosis not present

## 2015-08-06 DIAGNOSIS — N189 Chronic kidney disease, unspecified: Secondary | ICD-10-CM | POA: Diagnosis not present

## 2015-08-07 ENCOUNTER — Other Ambulatory Visit: Payer: Self-pay | Admitting: Rheumatology

## 2015-08-07 DIAGNOSIS — M25511 Pain in right shoulder: Principal | ICD-10-CM

## 2015-08-07 DIAGNOSIS — G8929 Other chronic pain: Secondary | ICD-10-CM

## 2015-08-12 ENCOUNTER — Other Ambulatory Visit: Payer: Medicare Other

## 2015-08-12 DIAGNOSIS — E291 Testicular hypofunction: Secondary | ICD-10-CM

## 2015-08-13 ENCOUNTER — Telehealth: Payer: Self-pay

## 2015-08-13 DIAGNOSIS — Z79899 Other long term (current) drug therapy: Secondary | ICD-10-CM

## 2015-08-13 DIAGNOSIS — N400 Enlarged prostate without lower urinary tract symptoms: Secondary | ICD-10-CM

## 2015-08-13 LAB — TESTOSTERONE: Testosterone: 832 ng/dL (ref 348–1197)

## 2015-08-13 NOTE — Telephone Encounter (Signed)
Spoke with pt wife and made aware of testosterone results. Made aware labs need to be drawn in June and office visit. Wife voiced understanding. Orders placed.

## 2015-08-13 NOTE — Telephone Encounter (Signed)
-----   Message from Nori Riis, PA-C sent at 08/13/2015  8:29 AM EDT ----- Testosterone is good.  Patient has a lab draw in June for a testosterone.  He will also need a PSA and HCT with that blood work and an office visit with me.

## 2015-08-14 ENCOUNTER — Other Ambulatory Visit: Payer: Self-pay | Admitting: Cardiovascular Disease

## 2015-08-26 ENCOUNTER — Ambulatory Visit
Admission: RE | Admit: 2015-08-26 | Discharge: 2015-08-26 | Disposition: A | Discharge status: Home / Self Care | Payer: Medicare Other | Source: Ambulatory Visit | Attending: Rheumatology | Admitting: Rheumatology

## 2015-08-26 DIAGNOSIS — M25511 Pain in right shoulder: Secondary | ICD-10-CM | POA: Insufficient documentation

## 2015-08-26 DIAGNOSIS — M948X1 Other specified disorders of cartilage, shoulder: Secondary | ICD-10-CM | POA: Diagnosis not present

## 2015-08-26 DIAGNOSIS — M7551 Bursitis of right shoulder: Secondary | ICD-10-CM | POA: Diagnosis not present

## 2015-08-26 DIAGNOSIS — G8929 Other chronic pain: Secondary | ICD-10-CM | POA: Insufficient documentation

## 2015-08-26 DIAGNOSIS — M75101 Unspecified rotator cuff tear or rupture of right shoulder, not specified as traumatic: Secondary | ICD-10-CM | POA: Insufficient documentation

## 2015-08-27 ENCOUNTER — Ambulatory Visit: Payer: Medicare Other | Admitting: Pain Medicine

## 2015-08-27 ENCOUNTER — Encounter: Payer: Self-pay | Admitting: Pain Medicine

## 2015-08-27 VITALS — BP 177/76 | HR 54 | Temp 97.8°F | Resp 16 | Ht 72.0 in | Wt 195.0 lb

## 2015-08-27 DIAGNOSIS — M948X1 Other specified disorders of cartilage, shoulder: Secondary | ICD-10-CM | POA: Diagnosis not present

## 2015-08-27 DIAGNOSIS — M25511 Pain in right shoulder: Secondary | ICD-10-CM | POA: Diagnosis not present

## 2015-08-27 DIAGNOSIS — M47812 Spondylosis without myelopathy or radiculopathy, cervical region: Secondary | ICD-10-CM

## 2015-08-27 DIAGNOSIS — M4726 Other spondylosis with radiculopathy, lumbar region: Secondary | ICD-10-CM

## 2015-08-27 DIAGNOSIS — G8929 Other chronic pain: Secondary | ICD-10-CM | POA: Diagnosis not present

## 2015-08-27 DIAGNOSIS — M47816 Spondylosis without myelopathy or radiculopathy, lumbar region: Secondary | ICD-10-CM

## 2015-08-27 DIAGNOSIS — M48061 Spinal stenosis, lumbar region without neurogenic claudication: Secondary | ICD-10-CM

## 2015-08-27 DIAGNOSIS — M533 Sacrococcygeal disorders, not elsewhere classified: Secondary | ICD-10-CM

## 2015-08-27 DIAGNOSIS — M75101 Unspecified rotator cuff tear or rupture of right shoulder, not specified as traumatic: Secondary | ICD-10-CM | POA: Diagnosis not present

## 2015-08-27 DIAGNOSIS — M7551 Bursitis of right shoulder: Secondary | ICD-10-CM | POA: Diagnosis not present

## 2015-08-27 DIAGNOSIS — M1711 Unilateral primary osteoarthritis, right knee: Secondary | ICD-10-CM

## 2015-08-27 DIAGNOSIS — M5416 Radiculopathy, lumbar region: Secondary | ICD-10-CM

## 2015-08-27 DIAGNOSIS — Z96652 Presence of left artificial knee joint: Secondary | ICD-10-CM

## 2015-08-27 DIAGNOSIS — M170 Bilateral primary osteoarthritis of knee: Secondary | ICD-10-CM | POA: Diagnosis not present

## 2015-08-27 MED ORDER — GABAPENTIN 800 MG PO TABS: 800.0000 mg | ORAL_TABLET | Freq: Three times a day (TID) | ORAL | Status: DC | PRN

## 2015-08-27 MED ORDER — ORPHENADRINE CITRATE 30 MG/ML IJ SOLN: 60.0000 mg | Freq: Once | INTRAMUSCULAR | Status: DC

## 2015-08-27 MED ORDER — FENTANYL 25 MCG/HR TD PT72: MEDICATED_PATCH | TRANSDERMAL | Status: DC

## 2015-08-27 MED ORDER — FENTANYL CITRATE (PF) 100 MCG/2ML IJ SOLN: 100.0000 ug | Freq: Once | INTRAMUSCULAR | Status: DC

## 2015-08-27 MED ORDER — CIPROFLOXACIN IN D5W 400 MG/200ML IV SOLN: 400.0000 mg | Freq: Once | INTRAVENOUS | Status: DC

## 2015-08-27 MED ORDER — CIPROFLOXACIN IN D5W 400 MG/200ML IV SOLN
INTRAVENOUS | Status: AC
  Administered 2015-08-27: 08:00:00
  Filled 2015-08-27: qty 200

## 2015-08-27 MED ORDER — CIPROFLOXACIN HCL 250 MG PO TABS: 250.0000 mg | ORAL_TABLET | Freq: Two times a day (BID) | ORAL | Status: DC

## 2015-08-27 MED ORDER — BUPIVACAINE HCL (PF) 0.25 % IJ SOLN
INTRAMUSCULAR | Status: AC
  Administered 2015-08-27: 08:00:00
  Filled 2015-08-27: qty 30

## 2015-08-27 MED ORDER — BUPIVACAINE HCL (PF) 0.25 % IJ SOLN: 30.0000 mL | Freq: Once | INTRAMUSCULAR | Status: DC

## 2015-08-27 MED ORDER — HYDROCODONE-ACETAMINOPHEN 5-325 MG PO TABS: ORAL_TABLET | ORAL | Status: DC

## 2015-08-27 MED ORDER — LACTATED RINGERS IV SOLN: 1000.0000 mL | INTRAVENOUS | Status: DC

## 2015-08-27 MED ORDER — LIDOCAINE HCL (PF) 1 % IJ SOLN: 10.0000 mL | Freq: Once | INTRAMUSCULAR | Status: DC

## 2015-08-27 MED ORDER — TRIAMCINOLONE ACETONIDE 40 MG/ML IJ SUSP
INTRAMUSCULAR | Status: AC
  Administered 2015-08-27: 08:00:00
  Filled 2015-08-27: qty 1

## 2015-08-27 MED ORDER — TRIAMCINOLONE ACETONIDE 40 MG/ML IJ SUSP: 40.0000 mg | Freq: Once | INTRAMUSCULAR | Status: DC

## 2015-08-27 NOTE — Progress Notes (Signed)
Safety precautions to be maintained throughout the outpatient stay will include: orient to surroundings, keep bed in low position, maintain call bell within reach at all times, provide assistance with transfer out of bed and ambulation.  

## 2015-08-27 NOTE — Patient Instructions (Addendum)
PLAN   Continue present medications Neurontin  hydrocodone acetaminophen and fentanyl patch . Please get Cipro antibiotic today and begin taking Cipro antibiotic today as prescribed  F/U PCP  C Doss for evaliation of  BP and general medical  condition  F/U surgical evaluation. May consider pending follow-up evaluations  F/U neurological evaluation. May consider PNCV/EMG studies and other studies pending follow-up evaluations  F/U Dr. [name] as discussed  F/U rheumatological evaluation as discussed  May consider radiofrequency rhizolysis or intraspinal procedures pending response to present treatment and F/U evaluation   Please call pain management for any concerns you may have prior to scheduled return appointmentPain Management Discharge Instructions  General Discharge Instructions :  If you need to reach your doctor call: Monday-Friday 8:00 am - 4:00 pm at [phone] or toll free [phone].  After clinic hours [phone] to have operator reach doctor.  Bring all of your medication bottles to all your appointments in the pain clinic.  To cancel or reschedule your appointment with Pain Management please remember to call 24 hours in advance to avoid a fee.  Refer to the educational materials which you have been given on: General Risks, I had my Procedure. Discharge Instructions, Post Sedation.  Post Procedure Instructions:  The drugs you were given will stay in your system until tomorrow, so for the next 24 hours you should not drive, make any legal decisions or drink any alcoholic beverages.  You may eat anything you prefer, but it is better to start with liquids then soups and crackers, and gradually work up to solid foods.  Please notify your doctor immediately if you have any unusual bleeding, trouble breathing or pain that is not related to your normal pain.  Depending on the type of procedure that was done, some parts of your body may feel week and/or numb.  This  usually clears up by tonight or the next day.  Walk with the use of an assistive device or accompanied by an adult for the 24 hours.  You may use ice on the affected area for the first 24 hours.  Put ice in a Ziploc bag and cover with a towel and place against area 15 minutes on 15 minutes off.  You may switch to heat after 24 hours.Knee Injection A knee injection is a procedure to get medicine into your knee joint. Your health care provider puts a needle into the joint and injects medicine with an attached syringe. The injected medicine may relieve the pain, swelling, and stiffness of arthritis. The injected medicine may also help to lubricate and cushion your knee joint. You may need more than one injection. LET [facility] CARE PROVIDER KNOW ABOUT:  Any allergies you have.  All medicines you are taking, including vitamins, herbs, eye drops, creams, and over-the-counter medicines.  Previous problems you or members of your family have had with the use of anesthetics.  Any blood disorders you have.  Previous surgeries you have had.  Any medical conditions you may have. RISKS AND COMPLICATIONS Generally, this is a safe procedure. However, problems may occur, including:  Infection.  Bleeding.  Worsening symptoms.  Damage to the area around your knee.  Allergic reaction to any of the medicines.  Skin reactions from repeated injections. BEFORE THE PROCEDURE  Ask your health care provider about changing or stopping your regular medicines. This is especially important if you are taking diabetes medicines or blood thinners.  Plan to have someone take you home after the procedure. PROCEDURE  You will sit or lie down in a position for your knee to be treated.  The skin over your kneecap will be cleaned with a germ-killing solution (antiseptic).  You will be given a medicine that numbs the area (local anesthetic). You may feel some stinging.  After your knee becomes numb, you will  have a second injection. This is the medicine. This needle is carefully placed between your kneecap and your knee. The medicine is injected into the joint space.  At the end of the procedure, the needle will be removed.  A bandage (dressing) may be placed over the injection site. The procedure may vary among health care providers and hospitals. AFTER THE PROCEDURE  You may have to move your knee through its full range of motion. This helps to get all of the medicine into your joint space.  Your blood pressure, heart rate, breathing rate, and blood oxygen level will be monitored often until the medicines you were given have worn off.  You will be watched to make sure that you do not have a reaction to the injected medicine.   This information is not intended to replace advice given to you by your health care provider. Make sure you discuss any questions you have with your health care provider.   Document Released: 08/01/2006 Document Revised: 05/31/2014 Document Reviewed: 03/20/2014 Elsevier Interactive Patient Education [employer].

## 2015-08-27 NOTE — Progress Notes (Signed)
Subjective:    Patient ID: Bryan [name], male    DOB: [birth year], 75 y.o.   MRN: [id number]  HPI  Geniculate nerve blocks of the Right knee   The patient is a 75 y.o. male who returns to the [facility] for further evaluation and treatment of pain involving the lumbar lower extremity region with severe pain of the right knee. Prior studies reveal patient to be with significant degenerative joint disease of the knee.  We will proceed with geniculate nerve blocks of the right knee in an attempt to decrease severity of symptoms, minimize the risk of medication escalation, hopefully retard progression of symptoms and avoid the need for more involved treatment.  The risks benefits and expectations of the procedure were discussed with the patient. The patient was with understanding and in agreement with suggested treatment plan.  DESCRIPTION OF PROCEDURE: Geniculate nerve blocks of the right knee. The  procedure was performed with IV Versed and IV fentanyl, conscious sedation and under fluoroscopic guidance.  NEEDLE PLACEMENT FOR BLOCK OF THE LATERAL SUPERIOR GENICULATE NERVE: The patient was taking to the fluoroscopy suite. With the patient supine, with knee in flexed position, Betadine prep of proposed entry site accomplished.  IV Versed, IV fentanyl conscious sedation, EKG, blood pressure, pulse and pulse oximetry monitoring were all in place. Under fluoroscopic guidance, a 22-gauge needle was inserted in the region of the right knee with needle placed at the lateral border of the femur at the junction of the shaft of the femur and the condyle of the femur.  Following needle placement at the lateral aspect of the knee, needle placement was then accomplished in the region of the medial aspect of the knee.  NEEDLE PLACEMENT FOR BLOCK OF THE MEDIAL SUPERIOR GENICULATE NERVE:  Under fluoroscopic guidance, a 22 - gauge needle was inserted in the region of the right knee with needle placed  at the medial border of the femur at the junction of the shaft of the femur and the condyle of the femur.   NEEDLE PLACEMENT FOR BLOCK OF THE MEDIAL INFERIOR GENICULATE NERVE:  Under fluoroscopic guidance, a 22 - gauge needle was inserted in the region of the right knee with needle placed at the junction of the shaft and plateau of the tibia.   Following needle placement on AP view of needles placed in all three locations, placement was then verified on lateral view with the tips of the superior lateral and superior medial needles documented to be one half the distance of the shaft of the femur and the tip of the inferior medial geniculate needle documented to be one half the distance of the shaft of the tibia.  Following documentation of needle placements on lateral view, each needle was injected with one mL of 0.25% bupivacaine with Kenalog. A total of 10 mg of Kenalog was utilized for the entire procedure. The patient tolerated the procedure well.    PLAN 1. Medications: Continue present medications Neurontin hydrocodone acetaminophen and fentanyl patch 2. Follow-up appointment with PCP C Doss for evaluation of blood pressure and general medical condition. 3. Follow-up surgical evaluation Has been addressed 4. Follow-up neurological evaluation Has been addressed     Follow-up rheumatological evaluation as discussed     Follow-up with Dr. [name] as discussed 5. The patient may be a candidate for radiofrequency rhizolysis and other treatment pending response to treatment on today's visit and follow-up evaluation. 6. The patient is advised to adhere to proper body mechanics  and avoid activities which appear to aggravate condition   Review of Systems     Objective:   Physical Exam        Assessment & Plan:

## 2015-08-28 ENCOUNTER — Ambulatory Visit: Payer: Medicare Other | Admitting: Pain Medicine

## 2015-08-28 ENCOUNTER — Telehealth: Payer: Self-pay | Admitting: *Deleted

## 2015-08-28 NOTE — Telephone Encounter (Signed)
No answer for post procedure followup call. ?

## 2015-09-16 ENCOUNTER — Ambulatory Visit: Payer: Medicare Other | Admitting: Pain Medicine

## 2015-09-16 DIAGNOSIS — L570 Actinic keratosis: Secondary | ICD-10-CM | POA: Diagnosis not present

## 2015-09-16 DIAGNOSIS — D485 Neoplasm of uncertain behavior of skin: Secondary | ICD-10-CM | POA: Diagnosis not present

## 2015-09-16 DIAGNOSIS — Z1283 Encounter for screening for malignant neoplasm of skin: Secondary | ICD-10-CM | POA: Diagnosis not present

## 2015-09-25 ENCOUNTER — Ambulatory Visit: Payer: Medicare Other | Attending: Pain Medicine | Admitting: Pain Medicine

## 2015-09-25 ENCOUNTER — Encounter: Payer: Self-pay | Admitting: Pain Medicine

## 2015-09-25 VITALS — BP 129/72 | HR 63 | Temp 98.0°F | Resp 16 | Ht 72.0 in | Wt 195.0 lb

## 2015-09-25 DIAGNOSIS — M4328 Fusion of spine, sacral and sacrococcygeal region: Secondary | ICD-10-CM | POA: Diagnosis not present

## 2015-09-25 DIAGNOSIS — M5416 Radiculopathy, lumbar region: Secondary | ICD-10-CM

## 2015-09-25 DIAGNOSIS — Z96652 Presence of left artificial knee joint: Secondary | ICD-10-CM

## 2015-09-25 DIAGNOSIS — M06862 Other specified rheumatoid arthritis, left knee: Secondary | ICD-10-CM | POA: Insufficient documentation

## 2015-09-25 DIAGNOSIS — M4726 Other spondylosis with radiculopathy, lumbar region: Secondary | ICD-10-CM

## 2015-09-25 DIAGNOSIS — M47816 Spondylosis without myelopathy or radiculopathy, lumbar region: Secondary | ICD-10-CM

## 2015-09-25 DIAGNOSIS — M48061 Spinal stenosis, lumbar region without neurogenic claudication: Secondary | ICD-10-CM

## 2015-09-25 DIAGNOSIS — M1711 Unilateral primary osteoarthritis, right knee: Secondary | ICD-10-CM | POA: Diagnosis not present

## 2015-09-25 DIAGNOSIS — M5136 Other intervertebral disc degeneration, lumbar region: Secondary | ICD-10-CM | POA: Diagnosis not present

## 2015-09-25 DIAGNOSIS — M545 Low back pain: Secondary | ICD-10-CM | POA: Diagnosis present

## 2015-09-25 DIAGNOSIS — M2041 Other hammer toe(s) (acquired), right foot: Secondary | ICD-10-CM | POA: Diagnosis not present

## 2015-09-25 DIAGNOSIS — M533 Sacrococcygeal disorders, not elsewhere classified: Secondary | ICD-10-CM

## 2015-09-25 DIAGNOSIS — M5030 Other cervical disc degeneration, unspecified cervical region: Secondary | ICD-10-CM | POA: Diagnosis not present

## 2015-09-25 DIAGNOSIS — M79601 Pain in right arm: Secondary | ICD-10-CM | POA: Diagnosis present

## 2015-09-25 DIAGNOSIS — Z96659 Presence of unspecified artificial knee joint: Secondary | ICD-10-CM | POA: Diagnosis not present

## 2015-09-25 DIAGNOSIS — N289 Disorder of kidney and ureter, unspecified: Secondary | ICD-10-CM | POA: Insufficient documentation

## 2015-09-25 DIAGNOSIS — M79602 Pain in left arm: Secondary | ICD-10-CM | POA: Diagnosis present

## 2015-09-25 DIAGNOSIS — M5126 Other intervertebral disc displacement, lumbar region: Secondary | ICD-10-CM | POA: Diagnosis not present

## 2015-09-25 DIAGNOSIS — M4806 Spinal stenosis, lumbar region: Secondary | ICD-10-CM | POA: Insufficient documentation

## 2015-09-25 DIAGNOSIS — M170 Bilateral primary osteoarthritis of knee: Secondary | ICD-10-CM | POA: Insufficient documentation

## 2015-09-25 DIAGNOSIS — M06861 Other specified rheumatoid arthritis, right knee: Secondary | ICD-10-CM | POA: Diagnosis not present

## 2015-09-25 DIAGNOSIS — M461 Sacroiliitis, not elsewhere classified: Secondary | ICD-10-CM | POA: Diagnosis not present

## 2015-09-25 DIAGNOSIS — M47812 Spondylosis without myelopathy or radiculopathy, cervical region: Secondary | ICD-10-CM

## 2015-09-25 DIAGNOSIS — M47817 Spondylosis without myelopathy or radiculopathy, lumbosacral region: Secondary | ICD-10-CM | POA: Diagnosis not present

## 2015-09-25 MED ORDER — HYDROCODONE-ACETAMINOPHEN 5-325 MG PO TABS: ORAL_TABLET | ORAL | Status: DC

## 2015-09-25 MED ORDER — FENTANYL 25 MCG/HR TD PT72: MEDICATED_PATCH | TRANSDERMAL | Status: DC

## 2015-09-25 NOTE — Patient Instructions (Signed)
PLAN   Continue present  medication Neurontin  hydrocodone acetaminophen and fentanyl patch  F/U PCP  C Doss for evaliation of  BP and general medical  condition  F/U surgical evaluation. May consider pending follow-up evaluations  F/U neurological evaluation. May consider pending follow-up evaluations  F/U Dr. [name] as needed and as discussed  F/U rheumatological evaluation as discussed  May consider radiofrequency rhizolysis or intraspinal procedures pending response to present treatment and F/U evaluation  Please call pain management for any concerns you may have prior to scheduled return appointment

## 2015-09-25 NOTE — Progress Notes (Signed)
Safety precautions to be maintained throughout the outpatient stay will include: orient to surroundings, keep bed in low position, maintain call bell within reach at all times, provide assistance with transfer out of bed and ambulation.  

## 2015-09-26 NOTE — Progress Notes (Signed)
Subjective:    Patient ID: Bryan [name], male    DOB: June 07, 1940, 75 y.o.   MRN: [id number]  HPI  The patient is a 75 year old gentleman who returns to pain management for further evaluation and treatment of pain involving the region of the lower back lower extremity region predominantly with pain involving the upper extremities is well. The patient has diagnoses of rheumatoid arthritis and has pain involving multiple regions. Patient is has significant pain of the lower back lower extremity region and region of the knees. The patient has had some improvement of his pain with geniculate nerve blocks of the knee. The patient denies any trauma change in events of daily living the call significant change in symptomatology. The patient states that he has a hammertoe right condition of the foot and will follow-up with his podiatrist in this regard. We will continue medications consisting of Neurontin hydrocodone acetaminophen and fentanyl patch which patient is tolerating well without undesirable side effects. We will remain available to consider patient for additional modifications of treatment pending response to treatment and follow-up evaluation. The patient is understanding and agreed to suggested treatment plan     Review of Systems     Objective:   Physical Exam  There was tends to palpation of paraspinal musculature region of the cervical region cervical facet region of moderate degree with moderate tenderness of the splenius capitis and occipitalis musculature regions. Palpation of the acromioclavicular and glenohumeral joint regions were attends to palpation of moderate degree. The patient was with some difficulty performing the drop test. The patient was with decreased grip strength with subluxation of the digits of the hand. Palpation over the region of the thoracic region thoracic facet region was with tenderness to palpation without crepitus of the thoracic region noted. There was  evidence of muscle spasms involving the thoracic and lumbar region. Palpation of the lumbar region lumbar facet region was with moderate tenderness to palpation with extension lateral bending and rotation reproducing moderate discomfort. There was moderate tenderness of the PSIS and PII S region as well as the gluteal and piriformis musculature regions. There was mild tenderness of the greater trochanteric region iliotibial band region. Straight leg raise was tolerates approximately 20 without a definite increase of pain with dorsiflexion noted. EHL strength was decreased. Patient status post total knee replacement There was negative clonus negative Homans. 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 A small papillary tumor could have a similar appearance.  Lumbar facet syndrome  Lumbar stenosis with neurogenic claudication  Rheumatoid arthritis  Sacroiliac joint dysfunction     PLAN   Continue present  medication Neurontin  hydrocodone acetaminophen and fentanyl patch  F/U PCP  C Doss for evaliation of  BP and general medical  condition  F/U surgical evaluation. May consider pending follow-up evaluations  F/U neurological evaluation. May consider pending follow-up evaluations  F/U Dr. [name] as needed and as discussed for evaluation of hammertoe and for general evaluation of patient's feet as discussed  F/U rheumatological evaluation as discussed  May consider radiofrequency rhizolysis or intraspinal procedures pending response to present treatment and F/U evaluation  Patient is to call  pain  management for any concerns prior to scheduled return appointment

## 2015-09-29 DIAGNOSIS — M2041 Other hammer toe(s) (acquired), right foot: Secondary | ICD-10-CM | POA: Diagnosis not present

## 2015-09-29 DIAGNOSIS — L97511 Non-pressure chronic ulcer of other part of right foot limited to breakdown of skin: Secondary | ICD-10-CM | POA: Diagnosis not present

## 2015-09-29 DIAGNOSIS — M1460 Charcot's joint, unspecified site: Secondary | ICD-10-CM | POA: Diagnosis not present

## 2015-09-29 DIAGNOSIS — M069 Rheumatoid arthritis, unspecified: Secondary | ICD-10-CM | POA: Diagnosis not present

## 2015-10-02 LAB — TOXASSURE SELECT 13 (MW), URINE

## 2015-10-02 NOTE — Progress Notes (Signed)
Quick Note:  Reviewed. ______ 

## 2015-10-06 ENCOUNTER — Encounter: Payer: Self-pay | Admitting: *Deleted

## 2015-10-07 DIAGNOSIS — M2041 Other hammer toe(s) (acquired), right foot: Secondary | ICD-10-CM | POA: Diagnosis not present

## 2015-10-08 NOTE — Discharge Instructions (Signed)
[facility] °[facility] ° °POST OPERATIVE INSTRUCTIONS FOR DR. TROXLER AND DR. FOWLER °[facility] ° ° °1. Take your medication as prescribed.  Pain medication should be taken only as needed. ° °2. Keep the dressing clean, dry and intact. ° °3. Keep your foot elevated above the heart level for the first 48 hours. ° °4. Walking to the bathroom and brief periods of walking are acceptable, unless we have instructed you to be non-weight bearing. ° °5. Always wear your post-op shoe when walking.  Always use your crutches if you are to be non-weight bearing. ° °6. Do not take a shower. Baths are permissible as long as the foot is kept out of the water.  ° °7. Every hour you are awake:  °- Bend your knee 15 times. °- Flex foot 15 times °- Massage calf 15 times ° °8. Call [facility] ([phone]) if any of the following problems occur: °- You develop a temperature or fever. °- The bandage becomes saturated with blood. °- Medication does not stop your pain. °- Injury of the foot occurs. °- Any symptoms of infection including redness, odor, or red streaks running from wound. ° °General Anesthesia, Adult, Care After °Refer to this sheet in the next few weeks. These instructions provide you with information on caring for yourself after your procedure. Your health care provider may also give you more specific instructions. Your treatment has been planned according to current medical practices, but problems sometimes occur. Call your health care provider if you have any problems or questions after your procedure. °WHAT TO EXPECT AFTER THE PROCEDURE °After the procedure, it is typical to experience: °· Sleepiness. °· Nausea and vomiting. °HOME CARE INSTRUCTIONS °· For the first 24 hours after general anesthesia: °¨ Have a responsible person with you. °¨ Do not drive a car. If you are alone, do not take public transportation. °¨ Do not drink alcohol. °¨ Do not take  medicine that has not been prescribed by your health care provider. °¨ Do not sign important papers or make important decisions. °¨ You may resume a normal diet and activities as directed by your health care provider. °· Change bandages (dressings) as directed. °· If you have questions or problems that seem related to general anesthesia, call the hospital and ask for the anesthetist or anesthesiologist on call. °SEEK MEDICAL CARE IF: °· You have nausea and vomiting that continue the day after anesthesia. °· You develop a rash. °SEEK IMMEDIATE MEDICAL CARE IF:  °· You have difficulty breathing. °· You have chest pain. °· You have any allergic problems. °  °This information is not intended to replace advice given to you by your health care provider. Make sure you discuss any questions you have with your health care provider. °  °Document Released: 08/16/2000 Document Revised: 05/31/2014 Document Reviewed: 09/08/2011 °Elsevier Interactive Patient Education ©2016 Elsevier Inc[employer] ° °

## 2015-10-09 ENCOUNTER — Ambulatory Visit: Payer: Medicare Other | Admitting: Anesthesiology

## 2015-10-09 ENCOUNTER — Ambulatory Visit
Admission: RE | Admit: 2015-10-09 | Discharge: 2015-10-09 | Disposition: A | Discharge status: Home / Self Care | Payer: Medicare Other | Source: Ambulatory Visit | Attending: Podiatry | Admitting: Podiatry

## 2015-10-09 ENCOUNTER — Encounter
Admission: RE | Disposition: A | Discharge status: Home / Self Care | Payer: Self-pay | Source: Ambulatory Visit | Attending: Podiatry

## 2015-10-09 DIAGNOSIS — M2041 Other hammer toe(s) (acquired), right foot: Secondary | ICD-10-CM | POA: Insufficient documentation

## 2015-10-09 DIAGNOSIS — L97511 Non-pressure chronic ulcer of other part of right foot limited to breakdown of skin: Secondary | ICD-10-CM | POA: Diagnosis not present

## 2015-10-09 DIAGNOSIS — I10 Essential (primary) hypertension: Secondary | ICD-10-CM | POA: Insufficient documentation

## 2015-10-09 DIAGNOSIS — M069 Rheumatoid arthritis, unspecified: Secondary | ICD-10-CM | POA: Diagnosis not present

## 2015-10-09 DIAGNOSIS — M1460 Charcot's joint, unspecified site: Secondary | ICD-10-CM | POA: Diagnosis not present

## 2015-10-09 DIAGNOSIS — I739 Peripheral vascular disease, unspecified: Secondary | ICD-10-CM | POA: Insufficient documentation

## 2015-10-09 DIAGNOSIS — I2510 Atherosclerotic heart disease of native coronary artery without angina pectoris: Secondary | ICD-10-CM | POA: Insufficient documentation

## 2015-10-09 DIAGNOSIS — G8929 Other chronic pain: Secondary | ICD-10-CM | POA: Insufficient documentation

## 2015-10-09 DIAGNOSIS — Z87891 Personal history of nicotine dependence: Secondary | ICD-10-CM | POA: Insufficient documentation

## 2015-10-09 HISTORY — PX: HAMMER TOE SURGERY: SHX385

## 2015-10-09 SURGERY — CORRECTION, HAMMER TOE
Anesthesia: Monitor Anesthesia Care | Site: Foot | Laterality: Right | Wound class: Clean

## 2015-10-09 MED ORDER — LIDOCAINE HCL (CARDIAC) 20 MG/ML IV SOLN
INTRAVENOUS | Status: DC | PRN
  Administered 2015-10-09: 40 mg via INTRAVENOUS

## 2015-10-09 MED ORDER — MIDAZOLAM HCL 2 MG/2ML IJ SOLN
INTRAMUSCULAR | Status: DC | PRN
  Administered 2015-10-09: 2 mg via INTRAVENOUS

## 2015-10-09 MED ORDER — FENTANYL CITRATE (PF) 100 MCG/2ML IJ SOLN: 25.0000 ug | INTRAMUSCULAR | Status: DC | PRN

## 2015-10-09 MED ORDER — OXYCODONE HCL 5 MG/5ML PO SOLN: 5.0000 mg | Freq: Once | ORAL | Status: DC | PRN

## 2015-10-09 MED ORDER — LACTATED RINGERS IV SOLN
INTRAVENOUS | Status: DC
  Administered 2015-10-09: 07:00:00 via INTRAVENOUS

## 2015-10-09 MED ORDER — BUPIVACAINE HCL (PF) 0.5 % IJ SOLN
INTRAMUSCULAR | Status: DC | PRN
  Administered 2015-10-09: 1 mL

## 2015-10-09 MED ORDER — ACETAMINOPHEN 325 MG PO TABS: 325.0000 mg | ORAL_TABLET | ORAL | Status: DC | PRN

## 2015-10-09 MED ORDER — PROPOFOL 500 MG/50ML IV EMUL
INTRAVENOUS | Status: DC | PRN
  Administered 2015-10-09: 50 ug/kg/min via INTRAVENOUS

## 2015-10-09 MED ORDER — OXYCODONE HCL 5 MG PO TABS: 5.0000 mg | ORAL_TABLET | Freq: Once | ORAL | Status: DC | PRN

## 2015-10-09 MED ORDER — ACETAMINOPHEN 160 MG/5ML PO SOLN: 325.0000 mg | ORAL | Status: DC | PRN

## 2015-10-09 MED ORDER — CLINDAMYCIN PHOSPHATE 600 MG/50ML IV SOLN
600.0000 mg | Freq: Once | INTRAVENOUS | Status: AC
  Administered 2015-10-09 (×2): 600 mg via INTRAVENOUS

## 2015-10-09 MED ORDER — PROMETHAZINE HCL 25 MG/ML IJ SOLN
6.2500 mg | INTRAMUSCULAR | Status: DC | PRN
Start: 2015-10-09 — End: 2015-10-09

## 2015-10-09 SURGICAL SUPPLY — 64 items
BANDAGE ELASTIC 4 CLIP NS LF (GAUZE/BANDAGES/DRESSINGS) ×2 IMPLANT
BENZOIN TINCTURE PRP APPL 2/3 (GAUZE/BANDAGES/DRESSINGS) ×2 IMPLANT
BLADE CRESCENTIC (BLADE) IMPLANT
BLADE MED AGGRESSIVE (BLADE) IMPLANT
BLADE MINI RND TIP GREEN BEAV (BLADE) ×2 IMPLANT
BLADE OSC/SAGITTAL 5.5X25 (BLADE) IMPLANT
BLADE OSC/SAGITTAL MD 5.5X18 (BLADE) IMPLANT
BLADE OSC/SAGITTAL MD 9X18.5 (BLADE) ×2 IMPLANT
BLADE OSCILLATING/SAGITTAL (BLADE) ×1
BLADE SW THK.38XMED LNG THN (BLADE) ×1 IMPLANT
BNDG ESMARK 4X12 TAN STRL LF (GAUZE/BANDAGES/DRESSINGS) ×2 IMPLANT
BNDG GAUZE 4.5X4.1 6PLY STRL (MISCELLANEOUS) ×2 IMPLANT
BNDG STRETCH 4X75 STRL LF (GAUZE/BANDAGES/DRESSINGS) ×2 IMPLANT
BUR EGG 4X8 MED (BURR) IMPLANT
BUR STRYKR EGG 5.0 (BURR) IMPLANT
CANISTER SUCT 1200ML W/VALVE (MISCELLANEOUS) ×2 IMPLANT
CAST PADDING 3X4FT ST 30246 (SOFTGOODS)
COVER LIGHT HANDLE UNIVERSAL (MISCELLANEOUS) ×4 IMPLANT
COVER PIN YLW 0.028-062 (MISCELLANEOUS) ×2 IMPLANT
CUFF TOURN SGL QUICK 18 (TOURNIQUET CUFF) IMPLANT
DRAPE FLUOR MINI C-ARM 54X84 (DRAPES) ×2 IMPLANT
DRILL WIRE PASS (DRILL) IMPLANT
DURAPREP 26ML APPLICATOR (WOUND CARE) ×2 IMPLANT
GAUZE PETRO XEROFOAM 1X8 (MISCELLANEOUS) ×2 IMPLANT
GAUZE SPONGE 4X4 12PLY STRL (GAUZE/BANDAGES/DRESSINGS) ×2 IMPLANT
GLOVE BIO SURGEON STRL SZ8 (GLOVE) ×2 IMPLANT
GOWN STRL REUS W/ TWL LRG LVL3 (GOWN DISPOSABLE) ×1 IMPLANT
GOWN STRL REUS W/ TWL XL LVL3 (GOWN DISPOSABLE) ×1 IMPLANT
GOWN STRL REUS W/TWL LRG LVL3 (GOWN DISPOSABLE) ×1
GOWN STRL REUS W/TWL XL LVL3 (GOWN DISPOSABLE) ×1
K-WIRE DBL END TROCAR 6X.045 (WIRE)
K-WIRE DBL END TROCAR 6X.062 (WIRE)
K-WIRE DBL TROCAR .045X4 ×2 IMPLANT
KIT ROOM TURNOVER OR (KITS) ×2 IMPLANT
KWIRE DBL END TROCAR 6X.045 (WIRE) IMPLANT
KWIRE DBL END TROCAR 6X.062 (WIRE) IMPLANT
KWIRE DBL TROCAR .045X4 ×1 IMPLANT
NEEDLE HYPO 18GX1.5 BLUNT FILL (NEEDLE) IMPLANT
NEEDLE HYPO 25GX1X1/2 BEV (NEEDLE) IMPLANT
NS IRRIG 500ML POUR BTL (IV SOLUTION) ×2 IMPLANT
PACK EXTREMITY ARMC (MISCELLANEOUS) ×2 IMPLANT
PAD CAST CTTN 3X4 STRL (SOFTGOODS) IMPLANT
PAD GROUND ADULT SPLIT (MISCELLANEOUS) ×2 IMPLANT
RASP SM TEAR CROSS CUT (RASP) ×2 IMPLANT
SPLINT CAST 1 STEP 4X30 (MISCELLANEOUS) IMPLANT
SPLINT FAST PLASTER 5X30 (CAST SUPPLIES)
SPLINT PLASTER CAST FAST 5X30 (CAST SUPPLIES) IMPLANT
STOCKINETTE STRL 6IN 960660 (GAUZE/BANDAGES/DRESSINGS) ×2 IMPLANT
STRAP BODY AND KNEE 60X3 (MISCELLANEOUS) ×2 IMPLANT
STRIP CLOSURE SKIN 1/4X4 (GAUZE/BANDAGES/DRESSINGS) ×2 IMPLANT
SUT ETHILON 4-0 (SUTURE)
SUT ETHILON 4-0 FS2 18XMFL BLK (SUTURE)
SUT ETHILON 5-0 FS-2 18 BLK (SUTURE) ×2 IMPLANT
SUT VIC AB 1 CT1 36 (SUTURE) IMPLANT
SUT VIC AB 2-0 CT1 27 (SUTURE)
SUT VIC AB 2-0 CT1 TAPERPNT 27 (SUTURE) IMPLANT
SUT VIC AB 2-0 SH 27 (SUTURE)
SUT VIC AB 2-0 SH 27XBRD (SUTURE) IMPLANT
SUT VIC AB 3-0 SH 27 (SUTURE)
SUT VIC AB 3-0 SH 27X BRD (SUTURE) IMPLANT
SUT VIC AB 4-0 FS2 27 (SUTURE) ×2 IMPLANT
SUT VICRYL AB 3-0 FS1 BRD 27IN (SUTURE) IMPLANT
SUTURE ETHLN 4-0 FS2 18XMF BLK (SUTURE) IMPLANT
SYRINGE 10CC LL (SYRINGE) IMPLANT

## 2015-10-09 NOTE — Anesthesia Postprocedure Evaluation (Signed)
Anesthesia Post Note  Patient: Bryan [name]  Procedure(s) Performed: Procedure(s) (LRB): HAMMER TOE REPAIR WITH K-WIRE FIXATION RIGHT SECOND TOE (Right)  Patient location during evaluation: PACU Anesthesia Type: MAC Level of consciousness: awake and alert Pain management: pain level controlled Vital Signs Assessment: post-procedure vital signs reviewed and stable Respiratory status: spontaneous breathing, nonlabored ventilation, respiratory function stable and patient connected to nasal cannula oxygen Cardiovascular status: stable and blood pressure returned to baseline Anesthetic complications: no    Amaryllis Dyke

## 2015-10-09 NOTE — Op Note (Signed)
Operative note   Surgeon: Dr. Albertine Patricia, DPM.    Assistant: None    Preop diagnosis: Hammertoe deformity second toe right foot    Postop diagnosis: Same    Procedure:   1. Hammertoe repair with K wire fixation second toe right foot          EBL: 5 cc    Anesthesia:IV sedation delivered by the anesthesia team and local anesthesia including Marcaine plain 0.5% 3 cc     Hemostasis: Ankle tourniquet 250 mmHg pressure for 15 minutes     Specimen: None    Complications: None    Operative indications: Chronic pain and deformity and chronic tissue buildup breakdown to the tip of the right second toe secondary to the hammertoe deformity unresponsive to conservative care    Procedure:  Patient was brought into the OR and placed on the operating table in thesupine position. After anesthesia was obtained theright lower extremity was prepped and draped in usual sterile fashion.  Operative Report: This time attention was directed to the second toe of the right foot where 2 similar incisions were made over the PIP joint. This slip skin was then removed and the extensor tendon was notified and incised transversely reflected proximally. The proximal phalanx identified and resected with power equipment. After copious irrigation a 0.5 K wires drilled through the middle distal phalanges out the end of the toe then retrograded into the proximal phalanx for maintenance of corrected position. This time there is copiously irrigated the extensor tendon was reapproximated with 4-0 Vicryl simple ruptured sutures. Toe position and 10 position were checked with FluoroScan. Skin was enclosed with 5-0 nylon simple interrupted and horizontal mattress combination. A sterile compressive dressings placed across wound consisting of Xeroform gauze 4 x 4's and Kling. The tourniquet is released and prompt complete vascularity seen to return all digits. A Kerlix was wrapped around the foot along with an Ace wrap.      Patient tolerated the procedure and anesthesia well.  Was transported from the OR to the PACU with all vital signs stable and vascular status intact. To be discharged per routine protocol.  Will follow up in approximately 1 week in the outpatient clinic.

## 2015-10-09 NOTE — Anesthesia Preprocedure Evaluation (Addendum)
Anesthesia Evaluation  Patient identified by MRN, date of birth, ID band Patient awake    Reviewed: Allergy & Precautions, H&P , NPO status   Airway Mallampati: II  TM Distance: >3 FB Neck ROM: full    Dental   Pulmonary former smoker,    breath sounds clear to auscultation       Cardiovascular hypertension, + CAD and + Peripheral Vascular Disease  + dysrhythmias  Rhythm:regular Rate:Normal + Systolic murmurs    Neuro/Psych PSYCHIATRIC DISORDERS  Neuromuscular disease    GI/Hepatic   Endo/Other    Renal/GU Renal disease     Musculoskeletal   Abdominal   Peds  Hematology  (+) anemia ,   Anesthesia Other Findings   Reproductive/Obstetrics                           Anesthesia Physical Anesthesia Plan  ASA: III  Anesthesia Plan: MAC   Post-op Pain Management:    Induction:   Airway Management Planned:   Additional Equipment:   Intra-op Plan:   Post-operative Plan:   Informed Consent: I have reviewed the patients History and Physical, chart, labs and discussed the procedure including the risks, benefits and alternatives for the proposed anesthesia with the patient or authorized representative who has indicated his/her understanding and acceptance.     Plan Discussed with: CRNA  Anesthesia Plan Comments:         Anesthesia Quick Evaluation

## 2015-10-09 NOTE — Anesthesia Procedure Notes (Signed)
Procedure Name: MAC Performed by: Georga Bora Pre-anesthesia Checklist: Patient identified, Emergency Drugs available, Suction available, Patient being monitored and Timeout performed Patient Re-evaluated:Patient Re-evaluated prior to inductionOxygen Delivery Method: Simple face mask

## 2015-10-09 NOTE — Transfer of Care (Signed)
Immediate Anesthesia Transfer of Care Note  Patient: Bryan [name]  Procedure(s) Performed: Procedure(s) with comments: HAMMER TOE CORRECTION (Right) - WITH LOCAL  Patient Location: PACU  Anesthesia Type: MAC  Level of Consciousness: awake, alert  and patient cooperative  Airway and Oxygen Therapy: Patient Spontanous Breathing and Patient connected to supplemental oxygen  Post-op Assessment: Post-op Vital signs reviewed, Patient's Cardiovascular Status Stable, Respiratory Function Stable, Patent Airway and No signs of Nausea or vomiting  Post-op Vital Signs: Reviewed and stable  Complications: No apparent anesthesia complications

## 2015-10-09 NOTE — H&P (Signed)
H and P has been reviewed and no changes are noted.  

## 2015-10-10 ENCOUNTER — Encounter: Payer: Self-pay | Admitting: Podiatry

## 2015-10-11 ENCOUNTER — Inpatient Hospital Stay
Admission: EM | Admit: 2015-10-11 | Discharge: 2015-10-13 | DRG: 948 | Disposition: A | Discharge status: Home / Self Care | Payer: Medicare Other | Attending: Internal Medicine | Admitting: Internal Medicine

## 2015-10-11 ENCOUNTER — Emergency Department: Payer: Medicare Other

## 2015-10-11 DIAGNOSIS — R451 Restlessness and agitation: Secondary | ICD-10-CM | POA: Diagnosis present

## 2015-10-11 DIAGNOSIS — K5730 Diverticulosis of large intestine without perforation or abscess without bleeding: Secondary | ICD-10-CM | POA: Diagnosis not present

## 2015-10-11 DIAGNOSIS — I480 Paroxysmal atrial fibrillation: Secondary | ICD-10-CM | POA: Diagnosis present

## 2015-10-11 DIAGNOSIS — F0280 Dementia in other diseases classified elsewhere without behavioral disturbance: Secondary | ICD-10-CM | POA: Diagnosis present

## 2015-10-11 DIAGNOSIS — R4182 Altered mental status, unspecified: Principal | ICD-10-CM | POA: Diagnosis present

## 2015-10-11 DIAGNOSIS — Z66 Do not resuscitate: Secondary | ICD-10-CM | POA: Diagnosis present

## 2015-10-11 DIAGNOSIS — Z9049 Acquired absence of other specified parts of digestive tract: Secondary | ICD-10-CM

## 2015-10-11 DIAGNOSIS — R112 Nausea with vomiting, unspecified: Secondary | ICD-10-CM

## 2015-10-11 DIAGNOSIS — I10 Essential (primary) hypertension: Secondary | ICD-10-CM | POA: Diagnosis present

## 2015-10-11 DIAGNOSIS — E860 Dehydration: Secondary | ICD-10-CM | POA: Diagnosis not present

## 2015-10-11 DIAGNOSIS — I2510 Atherosclerotic heart disease of native coronary artery without angina pectoris: Secondary | ICD-10-CM | POA: Diagnosis present

## 2015-10-11 DIAGNOSIS — H409 Unspecified glaucoma: Secondary | ICD-10-CM | POA: Diagnosis present

## 2015-10-11 DIAGNOSIS — Z888 Allergy status to other drugs, medicaments and biological substances status: Secondary | ICD-10-CM | POA: Diagnosis not present

## 2015-10-11 DIAGNOSIS — T40605A Adverse effect of unspecified narcotics, initial encounter: Secondary | ICD-10-CM | POA: Diagnosis present

## 2015-10-11 DIAGNOSIS — Z952 Presence of prosthetic heart valve: Secondary | ICD-10-CM

## 2015-10-11 DIAGNOSIS — Z880 Allergy status to penicillin: Secondary | ICD-10-CM | POA: Diagnosis not present

## 2015-10-11 DIAGNOSIS — G8929 Other chronic pain: Secondary | ICD-10-CM | POA: Diagnosis present

## 2015-10-11 DIAGNOSIS — G309 Alzheimer's disease, unspecified: Secondary | ICD-10-CM | POA: Diagnosis present

## 2015-10-11 DIAGNOSIS — Z9889 Other specified postprocedural states: Secondary | ICD-10-CM | POA: Diagnosis not present

## 2015-10-11 DIAGNOSIS — M549 Dorsalgia, unspecified: Secondary | ICD-10-CM | POA: Diagnosis present

## 2015-10-11 DIAGNOSIS — Z825 Family history of asthma and other chronic lower respiratory diseases: Secondary | ICD-10-CM

## 2015-10-11 DIAGNOSIS — Z96652 Presence of left artificial knee joint: Secondary | ICD-10-CM | POA: Diagnosis present

## 2015-10-11 DIAGNOSIS — R0902 Hypoxemia: Secondary | ICD-10-CM

## 2015-10-11 DIAGNOSIS — R51 Headache: Secondary | ICD-10-CM | POA: Diagnosis not present

## 2015-10-11 DIAGNOSIS — Z87891 Personal history of nicotine dependence: Secondary | ICD-10-CM

## 2015-10-11 DIAGNOSIS — E785 Hyperlipidemia, unspecified: Secondary | ICD-10-CM | POA: Diagnosis present

## 2015-10-11 DIAGNOSIS — Z79899 Other long term (current) drug therapy: Secondary | ICD-10-CM

## 2015-10-11 DIAGNOSIS — Z8249 Family history of ischemic heart disease and other diseases of the circulatory system: Secondary | ICD-10-CM

## 2015-10-11 LAB — COMPREHENSIVE METABOLIC PANEL
ALT: 26 U/L (ref 17–63)
AST: 39 U/L (ref 15–41)
Albumin: 4.5 g/dL (ref 3.5–5.0)
Alkaline Phosphatase: 46 U/L (ref 38–126)
Anion gap: 9 (ref 5–15)
BUN: 25 mg/dL — ABNORMAL HIGH (ref 6–20)
CO2: 24 mmol/L (ref 22–32)
Calcium: 9.1 mg/dL (ref 8.9–10.3)
Chloride: 105 mmol/L (ref 101–111)
Creatinine, Ser: 1.26 mg/dL — ABNORMAL HIGH (ref 0.61–1.24)
GFR calc Af Amer: >60 mL/min (ref >60)
GFR calc non Af Amer: 53 mL/min — ABNORMAL LOW (ref >60)
Glucose, Bld: 111 mg/dL — ABNORMAL HIGH (ref 65–99)
Potassium: 3.8 mmol/L (ref 3.5–5.1)
Sodium: 138 mmol/L (ref 135–145)
Total Bilirubin: 0.6 mg/dL (ref 0.3–1.2)
Total Protein: 7.5 g/dL (ref 6.5–8.1)

## 2015-10-11 LAB — CBC WITH DIFFERENTIAL/PLATELET
Basophils Absolute: 0 10*3/uL (ref 0–0.1)
Basophils Relative: 0 %
Eosinophils Absolute: 0 10*3/uL (ref 0–0.7)
Eosinophils Relative: 0 %
HCT: 37.8 % — ABNORMAL LOW (ref 40.0–52.0)
Hemoglobin: 12.6 g/dL — ABNORMAL LOW (ref 13.0–18.0)
Lymphocytes Relative: 18 %
Lymphs Abs: 0.9 10*3/uL — ABNORMAL LOW (ref 1.0–3.6)
MCH: 30.7 pg (ref 26.0–34.0)
MCHC: 33.3 g/dL (ref 32.0–36.0)
MCV: 92.2 fL (ref 80.0–100.0)
Monocytes Absolute: 0.2 10*3/uL (ref 0.2–1.0)
Monocytes Relative: 4 %
Neutro Abs: 3.9 10*3/uL (ref 1.4–6.5)
Neutrophils Relative %: 78 %
Platelets: 134 10*3/uL — ABNORMAL LOW (ref 150–440)
RBC: 4.1 MIL/uL — ABNORMAL LOW (ref 4.40–5.90)
RDW: 14 % (ref 11.5–14.5)
WBC: 5.1 10*3/uL (ref 3.8–10.6)

## 2015-10-11 LAB — URINALYSIS COMPLETE WITH MICROSCOPIC (ARMC ONLY)
Bacteria, UA: NONE SEEN
Bilirubin Urine: NEGATIVE
Glucose, UA: NEGATIVE mg/dL
Hgb urine dipstick: NEGATIVE
Ketones, ur: NEGATIVE mg/dL
Leukocytes, UA: NEGATIVE
Nitrite: NEGATIVE
Protein, ur: 100 mg/dL — AB
Specific Gravity, Urine: 1.016 (ref 1.005–1.030)
Squamous Epithelial / LPF: NONE SEEN
pH: 7 (ref 5.0–8.0)

## 2015-10-11 LAB — TROPONIN I: Troponin I: <0.03 ng/mL (ref <0.031)

## 2015-10-11 MED ORDER — ONDANSETRON HCL 4 MG PO TABS: 4.0000 mg | ORAL_TABLET | Freq: Four times a day (QID) | ORAL | Status: DC | PRN

## 2015-10-11 MED ORDER — HALOPERIDOL LACTATE 5 MG/ML IJ SOLN
5.0000 mg | Freq: Once | INTRAMUSCULAR | Status: AC
  Administered 2015-10-11: 5 mg via INTRAVENOUS

## 2015-10-11 MED ORDER — FLUOXETINE HCL 20 MG PO CAPS
20.0000 mg | ORAL_CAPSULE | Freq: Every day | ORAL | Status: DC
  Administered 2015-10-12 – 2015-10-13 (×2): 20 mg via ORAL
  Filled 2015-10-11 (×2): qty 1

## 2015-10-11 MED ORDER — POTASSIUM CHLORIDE IN NACL 20-0.9 MEQ/L-% IV SOLN
INTRAVENOUS | Status: DC
  Administered 2015-10-11: via INTRAVENOUS
  Filled 2015-10-11 (×2): qty 1000

## 2015-10-11 MED ORDER — LORATADINE 10 MG PO TABS
10.0000 mg | ORAL_TABLET | Freq: Every day | ORAL | Status: DC
  Administered 2015-10-12 – 2015-10-13 (×2): 10 mg via ORAL
  Filled 2015-10-11 (×2): qty 1

## 2015-10-11 MED ORDER — IOPAMIDOL (ISOVUE-300) INJECTION 61%
100.0000 mL | Freq: Once | INTRAVENOUS | Status: AC | PRN
  Administered 2015-10-11: 100 mL via INTRAVENOUS

## 2015-10-11 MED ORDER — DONEPEZIL HCL 5 MG PO TABS
10.0000 mg | ORAL_TABLET | Freq: Every day | ORAL | Status: DC
Start: 2015-10-11 — End: 2015-10-13
  Administered 2015-10-12: 10 mg via ORAL
  Filled 2015-10-11 (×2): qty 2

## 2015-10-11 MED ORDER — ONDANSETRON HCL 4 MG/2ML IJ SOLN: 4.0000 mg | Freq: Four times a day (QID) | INTRAMUSCULAR | Status: DC | PRN

## 2015-10-11 MED ORDER — LORAZEPAM 2 MG/ML IJ SOLN
2.0000 mg | Freq: Once | INTRAMUSCULAR | Status: AC
  Administered 2015-10-11: 2 mg via INTRAVENOUS

## 2015-10-11 MED ORDER — LABETALOL HCL 5 MG/ML IV SOLN: 10.0000 mg | INTRAVENOUS | Status: DC | PRN

## 2015-10-11 MED ORDER — LISINOPRIL-HYDROCHLOROTHIAZIDE 20-25 MG PO TABS: 1.0000 | ORAL_TABLET | Freq: Every day | ORAL | Status: DC

## 2015-10-11 MED ORDER — LORAZEPAM 2 MG/ML IJ SOLN
INTRAMUSCULAR | Status: AC
  Administered 2015-10-11: 2 mg via INTRAVENOUS
  Filled 2015-10-11: qty 1

## 2015-10-11 MED ORDER — VITAMIN B-12 1000 MCG PO TABS
1000.0000 ug | ORAL_TABLET | Freq: Every day | ORAL | Status: DC
  Administered 2015-10-12: 1000 ug via ORAL
  Filled 2015-10-11 (×2): qty 1

## 2015-10-11 MED ORDER — PANTOPRAZOLE SODIUM 40 MG IV SOLR
40.0000 mg | INTRAVENOUS | Status: DC
  Administered 2015-10-11 – 2015-10-12 (×2): 40 mg via INTRAVENOUS
  Filled 2015-10-11 (×2): qty 40

## 2015-10-11 MED ORDER — TAMSULOSIN HCL 0.4 MG PO CAPS
0.4000 mg | ORAL_CAPSULE | Freq: Every day | ORAL | Status: DC
  Administered 2015-10-12: 0.4 mg via ORAL
  Filled 2015-10-11 (×2): qty 1

## 2015-10-11 MED ORDER — HYDROCODONE-ACETAMINOPHEN 5-325 MG PO TABS
1.0000 | ORAL_TABLET | Freq: Four times a day (QID) | ORAL | Status: DC | PRN
  Administered 2015-10-12 – 2015-10-13 (×4): 1 via ORAL
  Filled 2015-10-11 (×5): qty 1

## 2015-10-11 MED ORDER — IPRATROPIUM-ALBUTEROL 0.5-2.5 (3) MG/3ML IN SOLN
3.0000 mL | Freq: Once | RESPIRATORY_TRACT | Status: AC
  Administered 2015-10-11: 3 mL via RESPIRATORY_TRACT
  Filled 2015-10-11: qty 3

## 2015-10-11 MED ORDER — ONDANSETRON HCL 4 MG/2ML IJ SOLN
4.0000 mg | Freq: Once | INTRAMUSCULAR | Status: AC
  Administered 2015-10-11: 4 mg via INTRAVENOUS

## 2015-10-11 MED ORDER — ONDANSETRON HCL 4 MG/2ML IJ SOLN
4.0000 mg | Freq: Once | INTRAMUSCULAR | Status: AC
  Administered 2015-10-11: 4 mg via INTRAVENOUS
  Filled 2015-10-11: qty 2

## 2015-10-11 MED ORDER — ENOXAPARIN SODIUM 40 MG/0.4ML ~~LOC~~ SOLN
30.0000 mg | SUBCUTANEOUS | Status: DC
Start: 2015-10-11 — End: 2015-10-11

## 2015-10-11 MED ORDER — ACETAMINOPHEN 650 MG RE SUPP: 650.0000 mg | Freq: Four times a day (QID) | RECTAL | Status: DC | PRN

## 2015-10-11 MED ORDER — FENOFIBRATE 160 MG PO TABS
160.0000 mg | ORAL_TABLET | Freq: Every day | ORAL | Status: DC
  Administered 2015-10-12 – 2015-10-13 (×2): 160 mg via ORAL
  Filled 2015-10-11 (×2): qty 1

## 2015-10-11 MED ORDER — SIMVASTATIN 40 MG PO TABS
40.0000 mg | ORAL_TABLET | Freq: Every day | ORAL | Status: DC
  Administered 2015-10-12: 40 mg via ORAL
  Filled 2015-10-11: qty 1

## 2015-10-11 MED ORDER — SODIUM CHLORIDE 0.9 % IV SOLN
Freq: Once | INTRAVENOUS | Status: AC
  Administered 2015-10-11: 14:00:00 via INTRAVENOUS

## 2015-10-11 MED ORDER — ENOXAPARIN SODIUM 40 MG/0.4ML ~~LOC~~ SOLN
40.0000 mg | SUBCUTANEOUS | Status: DC
  Administered 2015-10-12: 40 mg via SUBCUTANEOUS
  Filled 2015-10-11: qty 0.4

## 2015-10-11 MED ORDER — NAPROXEN 500 MG PO TABS
500.0000 mg | ORAL_TABLET | Freq: Two times a day (BID) | ORAL | Status: DC
  Administered 2015-10-12 (×2): 500 mg via ORAL
  Filled 2015-10-11 (×5): qty 1

## 2015-10-11 MED ORDER — HALOPERIDOL LACTATE 5 MG/ML IJ SOLN
INTRAMUSCULAR | Status: AC
Start: 2015-10-11 — End: 2015-10-12
  Filled 2015-10-11: qty 1

## 2015-10-11 MED ORDER — HYDROXYCHLOROQUINE SULFATE 200 MG PO TABS
200.0000 mg | ORAL_TABLET | Freq: Every day | ORAL | Status: DC
  Administered 2015-10-12: 200 mg via ORAL
  Filled 2015-10-11 (×2): qty 1

## 2015-10-11 MED ORDER — ONDANSETRON HCL 4 MG/2ML IJ SOLN
INTRAMUSCULAR | Status: AC
  Filled 2015-10-11: qty 2

## 2015-10-11 MED ORDER — LISINOPRIL 10 MG PO TABS: 20.0000 mg | ORAL_TABLET | Freq: Every day | ORAL | Status: DC

## 2015-10-11 MED ORDER — ACETAMINOPHEN 325 MG PO TABS
650.0000 mg | ORAL_TABLET | Freq: Four times a day (QID) | ORAL | Status: DC | PRN
  Administered 2015-10-12: 650 mg via ORAL
  Filled 2015-10-11: qty 2

## 2015-10-11 NOTE — ED Notes (Signed)
Pt vomited large amounts of green/yellow fluids, per family pt has been vomiting at home as well

## 2015-10-11 NOTE — ED Notes (Signed)
Pt very agitated, attempting to climb out of bed stating" i need to get up and pee" refusing to use urinal. Pt confused. This RN and noel RN safely guided pt back into bed. Verbal orders received for haldol

## 2015-10-11 NOTE — ED Notes (Signed)
Pt becoming agitated, also vomiting at this time. EDP made aware and verbal orders received for ativan and zofran

## 2015-10-11 NOTE — H&P (Signed)
[facility] [place] NAME: Bryan [name]    MR#:  [id number]  DATE OF BIRTH:  [birth year]  DATE OF ADMISSION:  10/11/2015  PRIMARY CARE PHYSICIAN: Rubbie Battiest, NP   REQUESTING/REFERRING PHYSICIAN: Dr. Archie Balboa  CHIEF COMPLAINT:   Altered mental status HISTORY OF PRESENT ILLNESS:  Bryan [name]  is a 75 y.o. male with a known history of Hyperlipidemia, valvular heart disease, paroxysmal atrial fibrillation, hypertension and multiple other medical problems is brought into the ED via EMS for altered mental status. Patient has chronic dementia as reported by his wife. He just had right foot surgery done by Dr. [name] on May 18 and has been taking pain medications. Yesterday he did not eat much and today he is found to be very lethargic and started vomiting. Patient had 4-5 episodes of vomiting today. According to the wife patient is a poor historian. In the emergency department he was agitated and was given Ativan by the ED physician. Patient was completely lethargic and unable to get any history from him during my examination. CT head is negative. Patient's wife has provided me the history and patient currently is nonweightbearing on the his right foot  PAST MEDICAL HISTORY:   Past Medical History  Diagnosis Date  . Coronary artery disease   . Hyperlipidemia   . H/O thoracic aortic aneurysm repair   . Valvular heart disease   . Paroxysmal a-fib ([place])   . Hypertension   . Arrhythmia   . Glaucoma   . Depression   . Allergy   . History of blood transfusion   . History of chicken pox   . Chronic back pain   . Arthritis     reumatoid and osteo. worse in feet and ankles    PAST SURGICAL HISTOIRY:   Past Surgical History  Procedure Laterality Date  . Thoracic aortic aneurysm repair  2010  . Aortic valve replacement  2007    [facility].  [place]  . Cholecystectomy  2010  . Appendectomy  2010  . Replacement  total knee Left 2003  . Hammer toe surgery Right 10/09/2015    Procedure: HAMMER TOE REPAIR WITH K-WIRE FIXATION RIGHT SECOND TOE;  Surgeon: Albertine Patricia, DPM;  Location: [place];  Service: Podiatry;  Laterality: Right;  WITH LOCAL    SOCIAL HISTORY:   Social History  Substance Use Topics  . Smoking status: Former Smoker -- 1.00 packs/day for 32 years    Types: Cigarettes    Quit date: 07/05/1981  . Smokeless tobacco: Not on file  . Alcohol Use: 0.6 oz/week    1 Glasses of wine per week    FAMILY HISTORY:   Family History  Problem Relation Age of Onset  . Heart failure Mother   . Hypertension Mother   . Asthma Mother   . Heart attack Father 39    MI  . Hypertension Sister     DRUG ALLERGIES:   Allergies  Allergen Reactions  . Demerol [Meperidine] Nausea And Vomiting    hives  . Penicillins Swelling    Hives     REVIEW OF SYSTEMS:  Review of systems unobtainable as the patient is very lethargic  MEDICATIONS AT HOME:   Prior to Admission medications   Medication Sig Start Date End Date Taking? Authorizing Provider  ALPHAGAN P 0.1 % SOLN Place 1 drop into both eyes daily.  01/15/15  Yes Historical Provider, MD  amoxicillin (AMOXIL) 500  MG capsule Take 2,000 mg by mouth as needed (prior to dental procedures).   Yes Historical Provider, MD  cetirizine (ZYRTEC) 10 MG tablet Take 10 mg by mouth daily.   Yes Historical Provider, MD  donepezil (ARICEPT) 10 MG tablet Take 10 mg by mouth at bedtime.   Yes Historical Provider, MD  fenofibrate 160 MG tablet TAKE ONE TABLET BY MOUTH DAILY 03/11/15  Yes Minna Merritts, MD  fentaNYL (DURAGESIC - DOSED MCG/HR) 25 MCG/HR patch Apply  1 patch to skin every 3 days if tolerated 09/25/15  Yes Mohammed Kindle, MD  FLUoxetine (PROZAC) 20 MG capsule Take 1 capsule (20 mg total) by mouth daily. 05/09/15  Yes Rubbie Battiest, NP  gabapentin (NEURONTIN) 800 MG tablet Take 1 tablet (800 mg total) by mouth 3 (three) times daily as  needed. 08/27/15  Yes Mohammed Kindle, MD  HYDROcodone-acetaminophen (NORCO/VICODIN) 5-325 MG tablet Limit  2-4 tablets by mouth per day for breakthrough pain while wearing fentanyl patch if tolerated 09/25/15  Yes Mohammed Kindle, MD  hydroxychloroquine (PLAQUENIL) 200 MG tablet Take 200 mg by mouth daily.   Yes Historical Provider, MD  lisinopril (PRINIVIL,ZESTRIL) 20 MG tablet Take 20 mg by mouth daily. 08/09/15  Yes Historical Provider, MD  lisinopril-hydrochlorothiazide (PRINZIDE,ZESTORETIC) 20-25 MG tablet TAKE ONE TABLET BY MOUTH EVERY DAY 08/14/15  Yes Minna Merritts, MD  naproxen (NAPROSYN) 500 MG tablet Take 500 mg by mouth 2 (two) times daily with a meal.   Yes Historical Provider, MD  predniSONE (DELTASONE) 5 MG tablet Take 5-10 mg by mouth daily as needed.   Yes Historical Provider, MD  simvastatin (ZOCOR) 40 MG tablet Take 40 mg by mouth at bedtime.  08/08/14  Yes Historical Provider, MD  tamsulosin (FLOMAX) 0.4 MG CAPS capsule Take 1 capsule (0.4 mg total) by mouth daily. 05/09/15  Yes Rubbie Battiest, NP  traZODone (DESYREL) 50 MG tablet Take 50 mg by mouth at bedtime as needed for sleep. Reported on 10/09/2015 04/15/15  Yes Historical Provider, MD  vitamin B-12 (CYANOCOBALAMIN) 1000 MCG tablet Take 1,000 mcg by mouth daily.   Yes Historical Provider, MD  polyethylene glycol powder (GLYCOLAX/MIRALAX) powder 1 capful daily in 4-8 oz of liquid 07/14/15   Rubbie Battiest, NP      VITAL SIGNS:  Blood pressure 181/87, pulse 87, temperature 97.3 F (36.3 C), resp. rate 20, weight 99.338 kg (219 lb), SpO2 100 %.  PHYSICAL EXAMINATION:  GENERAL:  75 y.o.-year-old patient lying in the bed with no acute distress.  EYES: Pupils equal, round, reactive to light and accommodation. No scleral icterus. HEENT: Head atraumatic, normocephalic. Oropharynx and nasopharynx clear.  NECK:  Supple, no jugular venous distention. No thyroid enlargement, no tenderness.  LUNGS: Normal breath sounds bilaterally, no  wheezing, rales,rhonchi or crepitation. No use of accessory muscles of respiration.  CARDIOVASCULAR: S1, S2 normal. No murmurs, rubs, or gallops.  ABDOMEN: Soft, nontender, nondistended. Bowel sounds present. No organomegaly or mass.  EXTREMITIES: No pedal edema, cyanosis, or clubbing. Right lower extremity clean dressing NEUROLOGIC: Very lethargic. Arousable but falling asleep  PSYCHIATRIC: The patient is very lethargic and has received Ativan in the ED SKIN: No obvious rash, lesion, or ulcer.   LABORATORY PANEL:   CBC  Recent Labs Lab 10/11/15 1347  WBC 5.1  HGB 12.6*  HCT 37.8*  PLT 134*   ------------------------------------------------------------------------------------------------------------------  Chemistries   Recent Labs Lab 10/11/15 1537  NA 138  K 3.8  CL 105  CO2 24  GLUCOSE  111*  BUN 25*  CREATININE 1.26*  CALCIUM 9.1  AST 39  ALT 26  ALKPHOS 46  BILITOT 0.6   ------------------------------------------------------------------------------------------------------------------  Cardiac Enzymes  Recent Labs Lab 10/11/15 1537  TROPONINI <0.03   ------------------------------------------------------------------------------------------------------------------  RADIOLOGY:  Dg Chest 1 View  10/11/2015  CLINICAL DATA:  Altered mental status EXAM: CHEST 1 VIEW COMPARISON:  01/30/2014 chest radiograph. FINDINGS: Sternotomy wires appear aligned and intact. Stable cardiomediastinal silhouette with top-normal heart size. No pneumothorax. No pleural effusion. Lungs appear clear, with no acute consolidative airspace disease and no pulmonary edema. IMPRESSION: No active disease. Electronically Signed   By: Ilona Sorrel M.D.   On: 10/11/2015 14:42   Ct Head Wo Contrast  10/11/2015  CLINICAL DATA:  75 year old male with history of headache EXAM: CT HEAD WITHOUT CONTRAST TECHNIQUE: Contiguous axial images were obtained from the base of the skull through the vertex  without intravenous contrast. COMPARISON:  CT 03/28/2014, MR 07/26/2014 FINDINGS: Unremarkable appearance of the calvarium without acute fracture or aggressive lesion. Unremarkable appearance of the scalp soft tissues. Unremarkable appearance of the bilateral orbits. Mastoid air cells are clear. No significant paranasal sinus disease No acute intracranial hemorrhage, midline shift, or mass effect. Gray-white differentiation is maintained, without CT evidence of acute ischemia. Unremarkable configuration of the ventricles. IMPRESSION: No CT evidence of acute intracranial abnormality. Signed, Dulcy Fanny. Earleen Newport, DO [facility] Electronically Signed   By: Corrie Mckusick D.O.   On: 10/11/2015 14:59   Ct Abdomen Pelvis W Contrast  10/11/2015  CLINICAL DATA:  Bilious vomiting and altered mental status. EXAM: CT ABDOMEN AND PELVIS WITH CONTRAST TECHNIQUE: Multidetector CT imaging of the abdomen and pelvis was performed using the standard protocol following bolus administration of intravenous contrast. CONTRAST:  19mL ISOVUE-300 IOPAMIDOL (ISOVUE-300) INJECTION 61% COMPARISON:  None. FINDINGS: Lower chest: No significant pulmonary nodules or acute consolidative airspace disease. There is a discontinuity in the lower most sternotomy wire. 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 Hepatobiliary: Normal liver with no liver mass. Cholecystectomy. Minimal prominence of the central intrahepatic bile ducts and common bile duct diameter 8 mm, within expected post cholecystectomy limits. No radiopaque choledocholithiasis. Pancreas: Normal, with no mass or duct dilation. Spleen: Normal size. No mass. Adrenals/Urinary Tract: Normal adrenals. Simple exophytic 1.5 cm renal cyst in the posterior mid to upper right kidney. Simple exophytic 1.9 cm renal cyst in the upper left kidney. No hydronephrosis . Normal bladder. Stomach/Bowel: Probable tiny hiatal  hernia. Otherwise collapsed and grossly normal stomach. Normal caliber small bowel with no small bowel wall thickening. Appendectomy. Mild diverticulosis throughout the colon, with no large bowel wall thickening or pericolonic fat stranding. Vascular/Lymphatic: Atherosclerotic nonaneurysmal abdominal aorta. Patent portal, splenic, hepatic and renal veins. No pathologically enlarged lymph nodes in the abdomen or pelvis. Reproductive: Top-normal size prostate with central prostatic defect suggestive of prior TURP. Other: No pneumoperitoneum.  Trace free fluid in the deep pelvis. Musculoskeletal: No aggressive appearing focal osseous lesions. Moderate degenerative changes in the visualized thoracolumbar spine. IMPRESSION: 1. No evidence of bowel obstruction or acute bowel inflammation. Mild colonic diverticulosis, with no evidence of acute diverticulitis. 2. Nonspecific trace free fluid in the deep pelvis. 3. Probable tiny hiatal hernia. Electronically Signed   By: Ilona Sorrel M.D.   On: 10/11/2015 19:34    EKG:   Orders placed or performed during the hospital encounter of 10/11/15  . ED EKG  . ED EKG    IMPRESSION AND PLAN:  Bryan [name]  is a 75 y.o. male with a known history of Hyperlipidemia, valvular heart disease, paroxysmal atrial fibrillation, hypertension and multiple other medical problems is brought into the ED via EMS for altered mental status. Patient has chronic dementia as reported by his wife. He just had right foot surgery done by Dr. [name] on May 18 and has been taking pain medications. Yesterday he did not eat much and today he is found to be very lethargic and started vomiting. Patient had 4-5 episodes of vomiting today. According to the wife patient is a poor historian. In the emergency department he was agitated and was given Ativan by the ED physician. Patient was completely lethargic and unable to get any history from him during my examination  #Altered mental status on  baseline Alzheimer's dementia could be from narcotic use and dehydration Patient just had right foot surgery on May 18 and on narcotics for pain management, we will hold off on the fentanyl patch and other sedatives including Neurontin Provide gentle hydration with IV fluids Tylenol as needed Patient is afebrile and normal white count at this point, doesn't seem to be septic and not considering antibiotics Monitor renal function closely and check BMP in a.m.   #Recent right foot surgery Consult is placed to podiatry Dr. [name] and nonweightbearing on the right foot at this time Patient is very lethargic and with altered mental status will discontinue fentanyl patch  #Essential hypertension We will continue his home medication of lisinopril and hydrochlorothiazide when patient is more awake and alert. We will give him IV Lopressor in the interim  #History of hyperlipidemia Resume fenofibrate which is his home medication once patient is more awake and alert   Provide GI and DVT prophylaxis    All the records are reviewed and case discussed with ED provider. Management plans discussed with the Patient's wife and she is in agreement.  CODE STATUS: DO NOT RESUSCITATE, wife is the healthcare power of attorney  TOTAL TIME TAKING CARE OF THIS PATIENT: 50 minutes.    Nicholes Mango M.D on 10/11/2015 at 9:20 PM  Between 7am to 6pm - Pager - [phone]  After 6pm go to [email or web address] - password [email or web address] [facility]  Office  [phone]  CC: Primary care physician; Rubbie Battiest, NP

## 2015-10-11 NOTE — ED Notes (Addendum)
Lab called that sample has hemolyzed X2, Lab to send tech to obtain blood

## 2015-10-11 NOTE — ED Notes (Signed)
Patient transported to CT 

## 2015-10-11 NOTE — ED Notes (Signed)
Pt hypoxic at 84% on RA upon return from CT, placed in 3L at 93%

## 2015-10-11 NOTE — ED Provider Notes (Signed)
[facility] Emergency Department Provider Note  L5 caveat: Review of systems and history is limited by altered mental status.      Time seen: ----------------------------------------- 2:20 PM on 10/11/2015 -----------------------------------------    I have reviewed the triage vital signs and the nursing notes.   HISTORY  Chief Complaint Altered Mental Status    HPI Bryan [name] is a 75 y.o. male who presents to ER being brought in by EMS due to altered mental status. EMS reports last night began having altered mental status and he was unable to answer questions. Patient's wife states he began being confused yesterday and the symptoms have progressed into today. Patient states he is fine whenever he is asked questions, he is very disoriented. He does have a history of Alzheimer's but it is very mild according to family. Normally he is essentially alert and oriented.   Past Medical History  Diagnosis Date  . Coronary artery disease   . Hyperlipidemia   . H/O thoracic aortic aneurysm repair   . Valvular heart disease   . Paroxysmal a-fib ([place])   . Hypertension   . Arrhythmia   . Glaucoma   . Depression   . Allergy   . History of blood transfusion   . History of chicken pox   . Chronic back pain   . Arthritis     reumatoid and osteo. worse in feet and ankles    Patient Active Problem List   Diagnosis Date Noted  . IBS (irritable bowel syndrome) 07/15/2015  . Erectile dysfunction of organic origin 07/15/2015  . Carotid stenosis 05/06/2015  . 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 Chronic kidney disease 02/17/2015  . 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  . 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 Rheumatoid arthritis ([place]) 10/01/2014  . DJD (degenerative joint disease), cervical 10/01/2014  . DJD (degenerative joint disease), lumbar 10/01/2014  . Sacroiliac joint disease  10/01/2014  . Dorsopathy 10/01/2014  . DDD (degenerative disc disease), cervical 10/01/2014  . Lumbar and sacral osteoarthritis 10/01/2014  . Amnesia 06/20/2014  . Mechanical and motor problems with internal organs 06/20/2014  . Transient alteration of awareness 06/19/2014  . Anemia due to other cause 06/19/2014  . Absolute anemia 06/19/2014  . Aneurysm, ascending aorta ([place]) 05/27/2014  . Aneurysm of thoracic aorta ([place]) 05/27/2014  . Thoracic aortic aneurysm without rupture ([place]) 05/27/2014  . Cognitive decline 04/01/2014  . Spinal stenosis of lumbar region with radiculopathy 01/30/2014  . Vitamin D deficiency 01/30/2014  . HLD (hyperlipidemia) 01/30/2014  . Back ache 01/30/2014  . Lumbar canal stenosis 01/30/2014  . Paroxysmal atrial fibrillation ([place]) 11/27/2013  . S/P AVR (aortic valve replacement) 11/27/2013  . History of ascending aorta repair 11/27/2013  . Hyperlipidemia 11/27/2013  . Essential hypertension 11/27/2013  . Bicuspid aortic valve 11/27/2013  . CAD in native artery 11/27/2013  . Aortic regurgitation, congenital 11/27/2013  . History of open heart surgery 11/27/2013  . Postprocedural state 11/27/2013  . History of cardiovascular surgery 11/27/2013    Past Surgical History  Procedure Laterality Date  . Thoracic aortic aneurysm repair  2010  . Aortic valve replacement  2007    [facility].  [place]  . Cholecystectomy  2010  . Appendectomy  2010  . Replacement total knee Left 2003  . Hammer toe surgery Right 10/09/2015    Procedure: HAMMER TOE REPAIR WITH K-WIRE FIXATION RIGHT SECOND TOE;  Surgeon: Albertine Patricia, DPM;  Location: [place];  Service: Podiatry;  Laterality: Right;  WITH LOCAL    Allergies Demerol and Penicillins  Social History Social History  Substance Use Topics  . Smoking status: Former Smoker -- 1.00 packs/day for 32 years    Types: Cigarettes    Quit date: 07/05/1981  . Smokeless tobacco: None  . Alcohol Use:  0.6 oz/week    1 Glasses of wine per week    Review of Systems Review of systems is unknown at this time  ____________________________________________   PHYSICAL EXAM:  VITAL SIGNS: ED Triage Vitals  Enc Vitals Group     BP 10/11/15 1344 145/101 mmHg     Pulse Rate 10/11/15 1345 74     Resp 10/11/15 1344 14     Temp 10/11/15 1346 97.3 F (36.3 C)     Temp src --      SpO2 10/11/15 1345 96 %     Weight 10/11/15 1340 219 lb (99.338 kg)     Height --      Head Cir --      Peak Flow --      Pain Score 10/11/15 1340 0     Pain Loc --      Pain Edu? --      Excl. in [place]? --    Constitutional: Alert But disoriented. No acute distress. Eyes: Conjunctivae are normal. PERRL. Normal extraocular movements. ENT   Head: Normocephalic and atraumatic.   Nose: No congestion/rhinnorhea.   Mouth/Throat: Mucous membranes are moist.   Neck: No stridor. Cardiovascular: Normal rate, regular rhythm. No murmurs, rubs, or gallops. Respiratory: Normal respiratory effort without tachypnea nor retractions. Breath sounds are clear and equal bilaterally. No wheezes/rales/rhonchi. Gastrointestinal: Soft and nontender. Normal bowel sounds Musculoskeletal: Nontender with normal range of motion in all extremities. No lower extremity tenderness nor edema. Neurologic:   No gross focal neurologic deficits are appreciated.  Skin:  Skin is warm, dry and intact. No rash noted. ____________________________________________  ED COURSE:  Pertinent labs & imaging results that were available during my care of the patient were reviewed by me and considered in my medical decision making (see chart for details). Patient resents to ER for altered mental status. I will check basic labs and reevaluate. Patient does have a history of dementiaBut is more confused than normal, was actively vomiting and he will receive IV Zofran. ____________________________________________    LABS (pertinent  positives/negatives)  Labs Reviewed  CBC WITH DIFFERENTIAL/PLATELET - Abnormal; Notable for the following:    RBC 4.10 (*)    Hemoglobin 12.6 (*)    HCT 37.8 (*)    Platelets 134 (*)    Lymphs Abs 0.9 (*)    All other components within normal limits  URINALYSIS COMPLETEWITH MICROSCOPIC ([facility] ONLY)  COMPREHENSIVE METABOLIC PANEL  TROPONIN I    RADIOLOGY  CT head, chest x-ray IMPRESSION: No active disease. IMPRESSION: No CT evidence of acute intracranial abnormality.  ____________________________________________  FINAL ASSESSMENT AND PLAN  Altered mental status  Plan: Patient with labs and imaging as dictated above. Patient with altered mental status of unclear etiology at this time.   Earleen Newport, MD   Note: This dictation was prepared with Dragon dictation. Any transcriptional errors that result from this process are unintentional   Earleen Newport, MD 10/11/15 [phone]

## 2015-10-11 NOTE — ED Notes (Signed)
Pt from home via ems, EMS was called by wife due to AMS, reports last night he began to have altered mental status, pt unable to answer questions, states "im fine" when asked a question. Disoriented X4. Hx of alzheimer's

## 2015-10-12 LAB — BASIC METABOLIC PANEL
Anion gap: 8 (ref 5–15)
BUN: 20 mg/dL (ref 6–20)
CO2: 27 mmol/L (ref 22–32)
Calcium: 9 mg/dL (ref 8.9–10.3)
Chloride: 102 mmol/L (ref 101–111)
Creatinine, Ser: 1.25 mg/dL — ABNORMAL HIGH (ref 0.61–1.24)
GFR calc Af Amer: >60 mL/min (ref >60)
GFR calc non Af Amer: 54 mL/min — ABNORMAL LOW (ref >60)
Glucose, Bld: 118 mg/dL — ABNORMAL HIGH (ref 65–99)
Potassium: 3.7 mmol/L (ref 3.5–5.1)
Sodium: 137 mmol/L (ref 135–145)

## 2015-10-12 LAB — CBC
HCT: 36.9 % — ABNORMAL LOW (ref 40.0–52.0)
Hemoglobin: 12.4 g/dL — ABNORMAL LOW (ref 13.0–18.0)
MCH: 30.8 pg (ref 26.0–34.0)
MCHC: 33.7 g/dL (ref 32.0–36.0)
MCV: 91.4 fL (ref 80.0–100.0)
Platelets: 117 10*3/uL — ABNORMAL LOW (ref 150–440)
RBC: 4.04 MIL/uL — ABNORMAL LOW (ref 4.40–5.90)
RDW: 13.7 % (ref 11.5–14.5)
WBC: 7.2 10*3/uL (ref 3.8–10.6)

## 2015-10-12 MED ORDER — TRAZODONE HCL 50 MG PO TABS
50.0000 mg | ORAL_TABLET | Freq: Every evening | ORAL | Status: DC | PRN
  Administered 2015-10-12: 50 mg via ORAL
  Filled 2015-10-12: qty 1

## 2015-10-12 MED ORDER — HYDROCHLOROTHIAZIDE 25 MG PO TABS
25.0000 mg | ORAL_TABLET | Freq: Every day | ORAL | Status: DC
Start: 2015-10-12 — End: 2015-10-13
  Administered 2015-10-12 – 2015-10-13 (×2): 25 mg via ORAL
  Filled 2015-10-12 (×3): qty 1

## 2015-10-12 MED ORDER — HYDRALAZINE HCL 20 MG/ML IJ SOLN
10.0000 mg | Freq: Four times a day (QID) | INTRAMUSCULAR | Status: DC | PRN
  Administered 2015-10-12 (×2): 10 mg via INTRAVENOUS
  Filled 2015-10-12 (×3): qty 1

## 2015-10-12 MED ORDER — LISINOPRIL 20 MG PO TABS
20.0000 mg | ORAL_TABLET | Freq: Every day | ORAL | Status: DC
  Administered 2015-10-12 – 2015-10-13 (×2): 20 mg via ORAL
  Filled 2015-10-12 (×3): qty 1

## 2015-10-12 MED ORDER — AMLODIPINE BESYLATE 10 MG PO TABS
10.0000 mg | ORAL_TABLET | Freq: Once | ORAL | Status: AC
  Administered 2015-10-12: 10 mg via ORAL
  Filled 2015-10-12: qty 1

## 2015-10-12 NOTE — Progress Notes (Signed)
MD notified of pt request for sleep aide. Pt takes trazodone 50 mg at home. MD ordered same for this admission

## 2015-10-12 NOTE — Progress Notes (Signed)
[facility] NAME: Bryan [name]    MR#:  [id number]  DATE OF BIRTH:  March 14, 1941  SUBJECTIVE:  CHIEF COMPLAINT:   Chief Complaint  Patient presents with  . Altered Mental Status   No complaint. But the blood pressure is elevated to 179/81. REVIEW OF SYSTEMS:  CONSTITUTIONAL: No fever, fatigue or weakness.  EYES: No blurred or double vision.  EARS, NOSE, AND THROAT: No tinnitus or ear pain.  RESPIRATORY: No cough, shortness of breath, wheezing or hemoptysis.  CARDIOVASCULAR: No chest pain, orthopnea, edema.  GASTROINTESTINAL: No nausea, vomiting, diarrhea or abdominal pain.  GENITOURINARY: No dysuria, hematuria.  ENDOCRINE: No polyuria, nocturia,  HEMATOLOGY: No anemia, easy bruising or bleeding SKIN: No rash or lesion. MUSCULOSKELETAL: No joint pain or arthritis.   NEUROLOGIC: No tingling, numbness, weakness.  PSYCHIATRY: No anxiety or depression.   DRUG ALLERGIES:   Allergies  Allergen Reactions  . Demerol [Meperidine] Nausea And Vomiting    hives  . Penicillins Swelling    Hives     VITALS:  Blood pressure 167/73, pulse 72, temperature 98.4 F (36.9 C), temperature source Oral, resp. rate 19, height 6' (1.829 m), weight 93.169 kg (205 lb 6.4 oz), SpO2 94 %.  PHYSICAL EXAMINATION:  GENERAL:  75 y.o.-year-old patient lying in the bed with no acute distress.  EYES: Pupils equal, round, reactive to light and accommodation. No scleral icterus. Extraocular muscles intact.  HEENT: Head atraumatic, normocephalic. Oropharynx and nasopharynx clear.  NECK:  Supple, no jugular venous distention. No thyroid enlargement, no tenderness.  LUNGS: Normal breath sounds bilaterally, no wheezing, rales,rhonchi or crepitation. No use of accessory muscles of respiration.  CARDIOVASCULAR: S1, S2 normal. No murmurs, rubs, or gallops.  ABDOMEN: Soft, nontender, nondistended. Bowel sounds present. No organomegaly or mass.   EXTREMITIES: No pedal edema, cyanosis, or clubbing.  NEUROLOGIC: Cranial nerves II through XII are intact. Muscle strength 5/5 in all extremities. Sensation intact. Gait not checked.  PSYCHIATRIC: The patient is alert and oriented x 3.  SKIN: No obvious rash, lesion, or ulcer.    LABORATORY PANEL:   CBC  Recent Labs Lab 10/12/15 0345  WBC 7.2  HGB 12.4*  HCT 36.9*  PLT 117*   ------------------------------------------------------------------------------------------------------------------  Chemistries   Recent Labs Lab 10/11/15 1537 10/12/15 0345  NA 138 137  K 3.8 3.7  CL 105 102  CO2 24 27  GLUCOSE 111* 118*  BUN 25* 20  CREATININE 1.26* 1.25*  CALCIUM 9.1 9.0  AST 39  --   ALT 26  --   ALKPHOS 46  --   BILITOT 0.6  --    ------------------------------------------------------------------------------------------------------------------  Cardiac Enzymes  Recent Labs Lab 10/11/15 1537  TROPONINI <0.03   ------------------------------------------------------------------------------------------------------------------  RADIOLOGY:  Dg Chest 1 View  10/11/2015  CLINICAL DATA:  Altered mental status EXAM: CHEST 1 VIEW COMPARISON:  01/30/2014 chest radiograph. FINDINGS: Sternotomy wires appear aligned and intact. Stable cardiomediastinal silhouette with top-normal heart size. No pneumothorax. No pleural effusion. Lungs appear clear, with no acute consolidative airspace disease and no pulmonary edema. IMPRESSION: No active disease. Electronically Signed   By: Ilona Sorrel M.D.   On: 10/11/2015 14:42   Ct Head Wo Contrast  10/11/2015  CLINICAL DATA:  75 year old male with history of headache EXAM: CT HEAD WITHOUT CONTRAST TECHNIQUE: Contiguous axial images were obtained from the base of the skull through the vertex without intravenous contrast. COMPARISON:  CT 03/28/2014, MR 07/26/2014 FINDINGS: Unremarkable appearance of the  calvarium without acute fracture or  aggressive lesion. Unremarkable appearance of the scalp soft tissues. Unremarkable appearance of the bilateral orbits. Mastoid air cells are clear. No significant paranasal sinus disease No acute intracranial hemorrhage, midline shift, or mass effect. Gray-white differentiation is maintained, without CT evidence of acute ischemia. Unremarkable configuration of the ventricles. IMPRESSION: No CT evidence of acute intracranial abnormality. Signed, Dulcy Fanny. Earleen Newport, DO [facility] Electronically Signed   By: Corrie Mckusick D.O.   On: 10/11/2015 14:59   Ct Abdomen Pelvis W Contrast  10/11/2015  CLINICAL DATA:  Bilious vomiting and altered mental status. EXAM: CT ABDOMEN AND PELVIS WITH CONTRAST TECHNIQUE: Multidetector CT imaging of the abdomen and pelvis was performed using the standard protocol following bolus administration of intravenous contrast. CONTRAST:  165mL ISOVUE-300 IOPAMIDOL (ISOVUE-300) INJECTION 61% COMPARISON:  None. FINDINGS: Lower chest: No significant pulmonary nodules or acute consolidative airspace disease. There is a discontinuity in the lower most sternotomy wire. 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 Hepatobiliary: Normal liver with no liver mass. Cholecystectomy. Minimal prominence of the central intrahepatic bile ducts and common bile duct diameter 8 mm, within expected post cholecystectomy limits. No radiopaque choledocholithiasis. Pancreas: Normal, with no mass or duct dilation. Spleen: Normal size. No mass. Adrenals/Urinary Tract: Normal adrenals. Simple exophytic 1.5 cm renal cyst in the posterior mid to upper right kidney. Simple exophytic 1.9 cm renal cyst in the upper left kidney. No hydronephrosis . Normal bladder. Stomach/Bowel: Probable tiny hiatal hernia. Otherwise collapsed and grossly normal stomach. Normal caliber small bowel with no small bowel wall thickening. Appendectomy. Mild diverticulosis  throughout the colon, with no large bowel wall thickening or pericolonic fat stranding. Vascular/Lymphatic: Atherosclerotic nonaneurysmal abdominal aorta. Patent portal, splenic, hepatic and renal veins. No pathologically enlarged lymph nodes in the abdomen or pelvis. Reproductive: Top-normal size prostate with central prostatic defect suggestive of prior TURP. Other: No pneumoperitoneum.  Trace free fluid in the deep pelvis. Musculoskeletal: No aggressive appearing focal osseous lesions. Moderate degenerative changes in the visualized thoracolumbar spine. IMPRESSION: 1. No evidence of bowel obstruction or acute bowel inflammation. Mild colonic diverticulosis, with no evidence of acute diverticulitis. 2. Nonspecific trace free fluid in the deep pelvis. 3. Probable tiny hiatal hernia. Electronically Signed   By: Ilona Sorrel M.D.   On: 10/11/2015 19:34    EKG:   Orders placed or performed during the hospital encounter of 10/11/15  . ED EKG  . ED EKG    ASSESSMENT AND PLAN:   Bryan [name] is a 75 y.o. male with a known history of Hyperlipidemia, valvular heart disease, paroxysmal atrial fibrillation, hypertension and multiple other medical problems is brought into the ED via EMS for altered mental status. Patient has chronic dementia as reported by his wife. He just had right foot surgery done by Dr. [name] on May 18 and has been taking pain medications. Yesterday he did not eat much and today he is found to be very lethargic and started vomiting. Patient had 4-5 episodes of vomiting today. According to the wife patient is a poor historian. In the emergency department he was agitated and was given Ativan by the ED physician. Patient was completely lethargic and unable to get any history from him in ED.  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 Improved to baseline.  could be from narcotic use and dehydration Patient just had right foot surgery on May 18 and on narcotics for pain  management,  hold fentanyl patch and  other sedatives including Neurontin Aspiration the fall precaution. PT evaluation.    Dehydration. Treated with IV fluids  #Recent right foot surgery Follow-up podiatry Dr. [name] and nonweightbearing on the right foot at this time.  # Accelerated hypertension Resume lisinopril and hydrochlorothiazide, IV hydralazine when necessary.  #History of hyperlipidemia Resume fenofibrate.   All the records are reviewed and case discussed with Care Management/Social Workerr. Management plans discussed with the patient, his wife and they are in agreement.  CODE STATUS: DO NOT RESUSCITATE  TOTAL TIME TAKING CARE OF THIS PATIENT: 39 minutes.  Greater than 50% time was spent on coordination of care and face-to-face counseling.  POSSIBLE D/C IN 1-2 DAYS, DEPENDING ON CLINICAL CONDITION.   Demetrios Loll M.D on 10/12/2015 at 1:16 PM  Between 7am to 6pm - Pager - [phone]  After 6pm go to [email or web address] - password [email or web address] [facility]  Office  [phone]  CC: Primary care physician; Rubbie Battiest, NP

## 2015-10-12 NOTE — Progress Notes (Signed)
Physical Therapy Evaluation Patient Details Name: Bryan [name] MRN: [id number] DOB: 1941/05/11 Today's Date: 10/12/2015   History of Present Illness  Bryan [name] is a 75 y.o. male with a known history of Hyperlipidemia, valvular heart disease, paroxysmal atrial fibrillation, hypertension and multiple other medical problems is brought into the ED via EMS for altered mental status. Patient has chronic dementia as reported by his wife. He just had right foot surgery done by Dr. [name] on May 18 and has been taking pain medications.  Pt with baseline Alheimer's dementia being treated for dehydration.  Clinical Impression  Pt presents to PT with difficulty walking.  Pt with recent hammer toe surgery and now ambulating with ortho shoe.  Pt uses a cane at home, but due to awkwardness with shoe, PT recommends RW at this time and to wear a regular shoe on opposite foot for increased stability with walking.  Pt reports a RW available for use in the home.  No further PT needs at this time.    Follow Up Recommendations No PT follow up    Equipment Recommendations  Rolling walker with 5" wheels    Recommendations for Other Services       Precautions / Restrictions Precautions Precautions: Fall Required Braces or Orthoses: Other Brace/Splint Other Brace/Splint: ortho shoe Restrictions RLE Weight Bearing: Non weight bearing Other Position/Activity Restrictions: able to ambulate 48 hours after surgery per discharge instructions from wife      Mobility  Bed Mobility Overal bed mobility: Modified Independent             General bed mobility comments: extra effort to scoot to EOB  Transfers Overall transfer level: Modified independent Equipment used: Straight cane             General transfer comment: sit<>stand with good body mechanics and safety  Ambulation/Gait Ambulation/Gait assistance: Supervision Ambulation Distance (Feet): 100 Feet Assistive device: Straight  cane Gait Pattern/deviations: Step-through pattern;Staggering left;Staggering right Gait velocity: varies, slow/fast,  did not maintain steady cadence   General Gait Details: Pt amb with cane and wall rail/furniture intermittently with step through gait pattern and R ortho shoe; decreased balance with staggering observed with awkwardness from shoe.  Stairs            Wheelchair Mobility    Modified Rankin (Stroke Patients Only)       Balance Overall balance assessment: Modified Independent                                           Pertinent Vitals/Pain Pain Assessment: No/denies pain    Home Living Family/patient expects to be discharged to:: Private residence Living Arrangements: Spouse/significant other Available Help at Discharge: Family Type of Home: Apartment Home Access: Level entry     Home Layout: One level Home Equipment: Cane - single point;Walker - 2 wheels      Prior Function Level of Independence: Independent with assistive device(s)               Hand Dominance        Extremity/Trunk Assessment   Upper Extremity Assessment: Overall WFL for tasks assessed           Lower Extremity Assessment: Overall WFL for tasks assessed         Communication   Communication: No difficulties  Cognition Arousal/Alertness: Lethargic (easily aroused) Behavior During Therapy: WFL for tasks assessed/performed  Overall Cognitive Status: Within Functional Limits for tasks assessed                      General Comments General comments (skin integrity, edema, etc.): R foot ace wrap intact    Exercises        Assessment/Plan    PT Assessment All further PT needs can be met in the next venue of care  PT Diagnosis Difficulty walking   PT Problem List Decreased balance  PT Treatment Interventions     PT Goals (Current goals can be found in the Care Plan section) Acute Rehab PT Goals Patient Stated Goal: To go  home. PT Goal Formulation: With patient    Frequency     Barriers to discharge        Co-evaluation               End of Session   Activity Tolerance: Patient tolerated treatment well Patient left: in bed;with family/visitor present Nurse Communication: Mobility status         Time: 1010-1035 PT Time Calculation (min) (ACUTE ONLY): 25 min   Charges:   PT Evaluation $PT Eval Low Complexity: 1 Procedure     PT G Codes:        Bryan [name] A Bryan [name], PT 10/12/2015, 10:44 AM

## 2015-10-12 NOTE — Discharge Instructions (Signed)
Heart healthy diet. Activity as tolerated. Avoid narcotics if possible. Fall precaution.

## 2015-10-12 NOTE — Care Management Note (Addendum)
Case Management Note  Patient Details  Name: Bryan [name] MRN: [id number] Date of Birth: May 26, 1940  Subjective/Objective:       Discussed discharge planning with [name] and his wife. No home needs. Refused offer of a front wheel walker. Wife reports that [name] was told by Dr [name] that he could weight bear on his surgical foot as long as he wears his "boot". [name] has a cane at home for balance if needed. [name] received a Forensic psychologist by Dr [name] on 10/09/15. Admitted 10/11/15 with AMS, confusion. Has some baseline dementia per family report. Hx: HTN, A-Fib, Heart valve repair. Discharge home today cancelled per elevated BP all day. Case management will follow for discharge planning.             Action/Plan:   Expected Discharge Date:  10/13/15               Expected Discharge Plan:     In-House Referral:     Discharge planning Services     Post Acute Care Choice:    Choice offered to:     DME Arranged:    DME Agency:     HH Arranged:    [place] Agency:     Status of Service:     Medicare Important Message Given:    Date Medicare IM Given:    Medicare IM give by:    Date Additional Medicare IM Given:    Additional Medicare Important Message give by:     If discussed at [place] of Stay Meetings, dates discussed:    Additional Comments:  Jacen Carlini A, RN 10/12/2015, 9:15 AM

## 2015-10-12 NOTE — Care Management Note (Signed)
Case Management Note  Patient Details  Name: [name] MRN: [id number] Date of Birth: 1940-11-07  Subjective/Objective:    BP quite high today, much higher than yesterday. Dr Bridgett Larsson is aware.                 Action/Plan:   Expected Discharge Date:  10/13/15               Expected Discharge Plan:     In-House Referral:     Discharge planning Services     Post Acute Care Choice:    Choice offered to:     DME Arranged:    DME Agency:     HH Arranged:    [place] Agency:     Status of Service:     Medicare Important Message Given:    Date Medicare IM Given:    Medicare IM give by:    Date Additional Medicare IM Given:    Additional Medicare Important Message give by:     If discussed at [place] of Stay Meetings, dates discussed:    Additional Comments:  Jayleena Stille A, RN 10/12/2015, 2:58 PM

## 2015-10-13 LAB — BASIC METABOLIC PANEL
Anion gap: 11 (ref 5–15)
BUN: 20 mg/dL (ref 6–20)
CO2: 24 mmol/L (ref 22–32)
Calcium: 9.5 mg/dL (ref 8.9–10.3)
Chloride: 99 mmol/L — ABNORMAL LOW (ref 101–111)
Creatinine, Ser: 1.08 mg/dL (ref 0.61–1.24)
GFR calc Af Amer: >60 mL/min (ref >60)
GFR calc non Af Amer: >60 mL/min (ref >60)
Glucose, Bld: 125 mg/dL — ABNORMAL HIGH (ref 65–99)
Potassium: 3.4 mmol/L — ABNORMAL LOW (ref 3.5–5.1)
Sodium: 134 mmol/L — ABNORMAL LOW (ref 135–145)

## 2015-10-13 MED ORDER — POTASSIUM CHLORIDE CRYS ER 20 MEQ PO TBCR
40.0000 meq | EXTENDED_RELEASE_TABLET | Freq: Once | ORAL | Status: DC
  Filled 2015-10-13: qty 2

## 2015-10-13 MED ORDER — LABETALOL HCL 5 MG/ML IV SOLN
10.0000 mg | Freq: Once | INTRAVENOUS | Status: AC
  Administered 2015-10-13: 10 mg via INTRAVENOUS
  Filled 2015-10-13: qty 4

## 2015-10-13 MED ORDER — AMLODIPINE BESYLATE 10 MG PO TABS: 10.0000 mg | ORAL_TABLET | Freq: Every day | ORAL | Status: DC

## 2015-10-13 MED ORDER — AMLODIPINE BESYLATE 10 MG PO TABS
10.0000 mg | ORAL_TABLET | Freq: Every day | ORAL | Status: DC
Start: 2015-10-13 — End: 2015-10-13
  Administered 2015-10-13: 10 mg via ORAL
  Filled 2015-10-13: qty 1

## 2015-10-13 MED ORDER — HYDRALAZINE HCL 20 MG/ML IJ SOLN
10.0000 mg | Freq: Once | INTRAMUSCULAR | Status: AC
  Administered 2015-10-13: 10 mg via INTRAVENOUS
  Filled 2015-10-13: qty 1

## 2015-10-13 NOTE — Care Management Important Message (Signed)
Important Message  Patient Details  Name: Bryan [name] MRN: [id number] Date of Birth: July 09, 1940   Medicare Important Message Given:  Yes    Juliann Pulse A Keyia Moretto 10/13/2015, 11:19 AM

## 2015-10-13 NOTE — Progress Notes (Signed)
Informed MD of BP of 198/98, HR 80, pt with severe headache. MD advised to give AM BP medication early.

## 2015-10-13 NOTE — Progress Notes (Signed)
Going into room to administer bp medicine. Pt states he wants to leave and is on the phine with wife to pick him up. Nursing supervisor called and MD notified. Supervisor on way.

## 2015-10-13 NOTE — Progress Notes (Signed)
MD notified of status of pt BP after giving am doses of bp medication early bp 188/88 and pt still complaining of HA. MD to put orders in for one time dose of labatelol. Will cont to monitor. No new orders to address HA

## 2015-10-13 NOTE — Progress Notes (Signed)
Speaking to wife on phone

## 2015-10-13 NOTE — Consult Note (Signed)
Patient Demographics  Bryan [name], is a 75 y.o. male   MRN: [id number]   DOB - 01/29/1941  Admit Date - 10/11/2015    Outpatient Primary MD for the patient is Doss, Velora Heckler, NP  Consult requested in the Hospital by Demetrios Loll, MD, On 10/13/2015    Reason for consult patient was admitted to the hospital due to some change in mental status Friday evening and Saturday. He had surgery done by me on Thursday for elective procedure to fix a hammertoe with a 0.045 K wire in the second toe.    With History of -  Past Medical History  Diagnosis Date  . Coronary artery disease   . Hyperlipidemia   . H/O thoracic aortic aneurysm repair   . Valvular heart disease   . Paroxysmal a-fib ([place])   . Hypertension   . Arrhythmia   . Glaucoma   . Depression   . Allergy   . History of blood transfusion   . History of chicken pox   . Chronic back pain   . Arthritis     reumatoid and osteo. 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 Thoracic aortic aneurysm repair  2010  . Aortic valve replacement  2007    [facility].  [place]  . Cholecystectomy  2010  . Appendectomy  2010  . Replacement total knee Left 2003  . Hammer toe surgery Right 10/09/2015    Procedure: HAMMER TOE REPAIR WITH K-WIRE FIXATION RIGHT SECOND TOE;  Surgeon: Albertine Patricia, DPM;  Location: [place];  Service: Podiatry;  Laterality: Right;  WITH LOCAL    in for   Chief Complaint  Patient presents with  . Altered Mental Status     HPI  Bryan [name]  is a 75 y.o. male, Admitted to the hospital due to some mental status changes as well as some increasing blood pressure. Underwent surgery for hammertoe correction on Thursday of last week which she tolerated well. This was 5-18. His  wife was concerned about his mental status and changes in his behavior on Friday evening and bottom the hospital on Saturday. He's been doing better but is anxious and wanted to leave the hospital and get back home. She had specific problems with his foot.   Review of Systems  seems alert at this point and is responsive to my questions and is friendly and a good mood. His wife is with him. He is stable with his vital signs lower extremity exam is below.  Social History Social History  Substance Use Topics  . Smoking status: Former Smoker -- 1.00 packs/day for 32 years    Types: Cigarettes    Quit date: 07/05/1981  . Smokeless tobacco: Not on file  . Alcohol Use: 0.6 oz/week    1 Glasses of wine per week      Family History Family History  Problem Relation Age of Onset  . Heart failure Mother   . Hypertension Mother   . Asthma Mother   . Heart attack Father 25    MI  . Hypertension Sister  Prior to Admission medications   Medication Sig Start Date End Date Taking? Authorizing Provider  ALPHAGAN P 0.1 % SOLN Place 1 drop into both eyes daily.  01/15/15  Yes Historical Provider, MD  amoxicillin (AMOXIL) 500 MG capsule Take 2,000 mg by mouth as needed (prior to dental procedures).   Yes Historical Provider, MD  cetirizine (ZYRTEC) 10 MG tablet Take 10 mg by mouth daily.   Yes Historical Provider, MD  donepezil (ARICEPT) 10 MG tablet Take 10 mg by mouth at bedtime.   Yes Historical Provider, MD  fenofibrate 160 MG tablet TAKE ONE TABLET BY MOUTH DAILY 03/11/15  Yes Minna Merritts, MD  fentaNYL (DURAGESIC - DOSED MCG/HR) 25 MCG/HR patch Apply  1 patch to skin every 3 days if tolerated 09/25/15  Yes Mohammed Kindle, MD  FLUoxetine (PROZAC) 20 MG capsule Take 1 capsule (20 mg total) by mouth daily. 05/09/15  Yes Rubbie Battiest, NP  gabapentin (NEURONTIN) 800 MG tablet Take 1 tablet (800 mg total) by mouth 3 (three) times daily as needed. 08/27/15  Yes Mohammed Kindle, MD   HYDROcodone-acetaminophen (NORCO/VICODIN) 5-325 MG tablet Limit  2-4 tablets by mouth per day for breakthrough pain while wearing fentanyl patch if tolerated 09/25/15  Yes Mohammed Kindle, MD  hydroxychloroquine (PLAQUENIL) 200 MG tablet Take 200 mg by mouth daily.   Yes Historical Provider, MD  lisinopril (PRINIVIL,ZESTRIL) 20 MG tablet Take 20 mg by mouth daily. 08/09/15  Yes Historical Provider, MD  lisinopril-hydrochlorothiazide (PRINZIDE,ZESTORETIC) 20-25 MG tablet TAKE ONE TABLET BY MOUTH EVERY DAY 08/14/15  Yes Minna Merritts, MD  naproxen (NAPROSYN) 500 MG tablet Take 500 mg by mouth 2 (two) times daily with a meal.   Yes Historical Provider, MD  predniSONE (DELTASONE) 5 MG tablet Take 5-10 mg by mouth daily as needed.   Yes Historical Provider, MD  simvastatin (ZOCOR) 40 MG tablet Take 40 mg by mouth at bedtime.  08/08/14  Yes Historical Provider, MD  tamsulosin (FLOMAX) 0.4 MG CAPS capsule Take 1 capsule (0.4 mg total) by mouth daily. 05/09/15  Yes Rubbie Battiest, NP  traZODone (DESYREL) 50 MG tablet Take 50 mg by mouth at bedtime as needed for sleep. Reported on 10/09/2015 04/15/15  Yes Historical Provider, MD  vitamin B-12 (CYANOCOBALAMIN) 1000 MCG tablet Take 1,000 mcg by mouth daily.   Yes Historical Provider, MD  amLODipine (NORVASC) 10 MG tablet Take 1 tablet (10 mg total) by mouth daily. 10/13/15   Demetrios Loll, MD  polyethylene glycol powder [facility]) powder 1 capful daily in 4-8 oz of liquid 07/14/15   Rubbie Battiest, NP    Anti-infectives    Start     Dose/Rate Route Frequency Ordered Stop   10/12/15 1000  hydroxychloroquine (PLAQUENIL) tablet 200 mg     200 mg Oral Daily 10/11/15 2213        Scheduled Meds: . amLODipine  10 mg Oral Daily  . donepezil  10 mg Oral QHS  . enoxaparin (LOVENOX) injection  40 mg Subcutaneous Q24H  . fenofibrate  160 mg Oral Daily  . FLUoxetine  20 mg Oral Daily  . hydrochlorothiazide  25 mg Oral Daily  . hydroxychloroquine  200 mg Oral Daily   . lisinopril  20 mg Oral Daily  . loratadine  10 mg Oral Daily  . naproxen  500 mg Oral BID WC  . pantoprazole (PROTONIX) IV  40 mg Intravenous Q24H  . potassium chloride  40 mEq Oral Once  . simvastatin  40  mg Oral QHS  . tamsulosin  0.4 mg Oral Daily  . vitamin B-12  1,000 mcg Oral Daily   Allergies  Allergen Reactions  . Demerol [Meperidine] Nausea And Vomiting    hives  . Penicillins Swelling    Hives     Physical Exam  Vitals  Blood pressure 151/89, pulse 86, temperature 98.1 F (36.7 C), temperature source Oral, resp. rate 17, height 6' (1.829 m), weight 93.169 kg (205 lb 6.4 oz), SpO2 100 %.  Lower Extremity exam:  Vascular:+2 over 4 bilateral  Dermatological: wound appears stable incision margins are intact. 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 Recent hammertoe surgery done by me on May 18 to correct a second toe hammertoe with K wire fixation. The pin is intact and he is stable with no evidence of infection or drainage from the wound. Toe sitting in good position.  Data Review  CBC  Recent Labs Lab 10/11/15 1347 10/12/15 0345  WBC 5.1 7.2  HGB 12.6* 12.4*  HCT 37.8* 36.9*  PLT 134* 117*  MCV 92.2 91.4  MCH 30.7 30.8  MCHC 33.3 33.7  RDW 14.0 13.7  LYMPHSABS 0.9*  --   MONOABS 0.2  --   EOSABS 0.0  --   BASOSABS 0.0  --    ------------------------------------------------------------------------------------------------------------------  Chemistries   Recent Labs Lab 10/11/15 1537 10/12/15 0345 10/13/15 0422  NA 138 137 134*  K 3.8 3.7 3.4*  CL 105 102 99*  CO2 24 27 24   GLUCOSE 111* 118* 125*  BUN 25* 20 20  CREATININE 1.26* 1.25* 1.08  CALCIUM 9.1 9.0 9.5  AST 39  --   --   ALT 26  --   --   ALKPHOS 46  --   --   BILITOT 0.6  --   --     ------------------------------------------------------------------------------------------------------------------ estimated creatinine clearance is 67.9 mL/min (by C-G formula based on Cr of 1.08). ------------------------------------------------------------------------------------------------------------------ No results for input(s): TSH, T4TOTAL, T3FREE, THYROIDAB in the last 72 hours.  Invalid input(s): FREET3   Coagulation profile No results for input(s): INR, PROTIME in the last 168 hours. ------------------------------------------------------------------------------------------------------------------- No results for input(s): DDIMER in the last 72 hours. -------------------------------------------------------------------------------------------------------------------  Cardiac Enzymes  Recent Labs Lab 10/11/15 1537  TROPONINI <0.03   ------------------------------------------------------------------------------------------------------------------ Invalid input(s): POCBNP   ---------------------------------------------------------------------------------------------------------------  Urinalysis    Component Value Date/Time   COLORURINE YELLOW* 10/11/2015 1517   COLORURINE Yellow 03/28/2014 1145   APPEARANCEUR CLEAR* 10/11/2015 1517   APPEARANCEUR Clear 03/28/2014 1145   LABSPEC 1.016 10/11/2015 1517   LABSPEC 1.016 03/28/2014 1145   PHURINE 7.0 10/11/2015 1517   PHURINE 6.0 03/28/2014 1145   GLUCOSEU NEGATIVE 10/11/2015 1517   GLUCOSEU Negative 03/28/2014 1145   HGBUR NEGATIVE 10/11/2015 1517   HGBUR Negative 03/28/2014 1145   BILIRUBINUR NEGATIVE 10/11/2015 1517   BILIRUBINUR Negative 03/28/2014 1145   KETONESUR NEGATIVE 10/11/2015 1517   KETONESUR Negative 03/28/2014 1145   PROTEINUR 100* 10/11/2015 1517   PROTEINUR Negative 03/28/2014 1145   NITRITE NEGATIVE 10/11/2015 1517   NITRITE Negative 03/28/2014 1145   LEUKOCYTESUR NEGATIVE 10/11/2015 1517    LEUKOCYTESUR Negative 03/28/2014 1145     Imaging results:   Dg Chest 1 View  10/11/2015  CLINICAL DATA:  Altered mental status EXAM: CHEST 1 VIEW COMPARISON:  01/30/2014 chest radiograph. FINDINGS: Sternotomy wires appear aligned and intact. Stable cardiomediastinal silhouette with top-normal heart size. No pneumothorax. No pleural  effusion. Lungs appear clear, with no acute consolidative airspace disease and no pulmonary edema. IMPRESSION: No active disease. Electronically Signed   By: Ilona Sorrel M.D.   On: 10/11/2015 14:42   Ct Head Wo Contrast  10/11/2015  CLINICAL DATA:  75 year old male with history of headache EXAM: CT HEAD WITHOUT CONTRAST TECHNIQUE: Contiguous axial images were obtained from the base of the skull through the vertex without intravenous contrast. COMPARISON:  CT 03/28/2014, MR 07/26/2014 FINDINGS: Unremarkable appearance of the calvarium without acute fracture or aggressive lesion. Unremarkable appearance of the scalp soft tissues. Unremarkable appearance of the bilateral orbits. Mastoid air cells are clear. No significant paranasal sinus disease No acute intracranial hemorrhage, midline shift, or mass effect. Gray-white differentiation is maintained, without CT evidence of acute ischemia. Unremarkable configuration of the ventricles. IMPRESSION: No CT evidence of acute intracranial abnormality. Signed, Dulcy Fanny. Earleen Newport, DO [facility] Electronically Signed   By: Corrie Mckusick D.O.   On: 10/11/2015 14:59   Ct Abdomen Pelvis W Contrast  10/11/2015  CLINICAL DATA:  Bilious vomiting and altered mental status. EXAM: CT ABDOMEN AND PELVIS WITH CONTRAST TECHNIQUE: Multidetector CT imaging of the abdomen and pelvis was performed using the standard protocol following bolus administration of intravenous contrast. CONTRAST:  152mL ISOVUE-300 IOPAMIDOL (ISOVUE-300) INJECTION 61% COMPARISON:  None. FINDINGS: Lower chest: No  significant pulmonary nodules or acute consolidative airspace disease. There is a discontinuity in the lower most sternotomy wire. 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 Hepatobiliary: Normal liver with no liver mass. Cholecystectomy. Minimal prominence of the central intrahepatic bile ducts and common bile duct diameter 8 mm, within expected post cholecystectomy limits. No radiopaque choledocholithiasis. Pancreas: Normal, with no mass or duct dilation. Spleen: Normal size. No mass. Adrenals/Urinary Tract: Normal adrenals. Simple exophytic 1.5 cm renal cyst in the posterior mid to upper right kidney. Simple exophytic 1.9 cm renal cyst in the upper left kidney. No hydronephrosis . Normal bladder. Stomach/Bowel: Probable tiny hiatal hernia. Otherwise collapsed and grossly normal stomach. Normal caliber small bowel with no small bowel wall thickening. Appendectomy. Mild diverticulosis throughout the colon, with no large bowel wall thickening or pericolonic fat stranding. Vascular/Lymphatic: Atherosclerotic nonaneurysmal abdominal aorta. Patent portal, splenic, hepatic and renal veins. No pathologically enlarged lymph nodes in the abdomen or pelvis. Reproductive: Top-normal size prostate with central prostatic defect suggestive of prior TURP. Other: No pneumoperitoneum.  Trace free fluid in the deep pelvis. Musculoskeletal: No aggressive appearing focal osseous lesions. Moderate degenerative changes in the visualized thoracolumbar spine. IMPRESSION: 1. No evidence of bowel obstruction or acute bowel inflammation. Mild colonic diverticulosis, with no evidence of acute diverticulitis. 2. Nonspecific trace free fluid in the deep pelvis. 3. Probable tiny hiatal hernia. Electronically Signed   By: Ilona Sorrel M.D.   On: 10/11/2015 19:34     Assessment & P: Stable to this foot at this point it's okay from my perspective if he goes home provided the hospitalist are okay with this point. Return to  my office in 1 week. Redressed the foot today with a new dressing healing appears to be doing well. Continue her postop shoe all the time. Active Problems:   Altered mental status     Family Communication: Plan discussed with patient and  his wife   Perry Mount M.D on 10/13/2015 at 12:09 PM  Thank you for the consult, we will follow the patient with you in the Hospital.

## 2015-10-13 NOTE — Progress Notes (Signed)
Notified MD of pt BP of 176/81. HR 79.  MD will put a OTO in for hydralazine.

## 2015-10-13 NOTE — Progress Notes (Signed)
Spoke to Dr. Vickki Muff from podiatry stated that Dr. [name] will come to pt room to assess.

## 2015-10-13 NOTE — Discharge Summary (Signed)
[facility] [place] NAME: Bryan [name]    MR#:  [id number]  DATE OF BIRTH:  February 18, 1941  DATE OF ADMISSION:  10/11/2015 ADMITTING PHYSICIAN: Nicholes Mango, MD  DATE OF DISCHARGE: 10/13/2015  PRIMARY CARE PHYSICIAN: Rubbie Battiest, NP    ADMISSION DIAGNOSIS:  Hypoxia [R09.02] Altered mental status, unspecified altered mental status type [R41.82] Non-intractable vomiting with nausea, vomiting of unspecified type [R11.2]   DISCHARGE DIAGNOSIS:   Altered mental status on baseline Alzheimer's dementia, due to narcotic use and dehydration SECONDARY DIAGNOSIS:   Past Medical History  Diagnosis Date  . Coronary artery disease   . Hyperlipidemia   . H/O thoracic aortic aneurysm repair   . Valvular heart disease   . Paroxysmal a-fib ([place])   . Hypertension   . Arrhythmia   . Glaucoma   . Depression   . Allergy   . History of blood transfusion   . History of chicken pox   . Chronic back pain   . 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 Improved to baseline. Due to narcotic use and dehydration Patient just had right foot surgery on May 18 and on narcotics for pain management,  hold fentanyl patch and other sedatives including Neurontin. Resume neurontin after discharge. Aspiration the fall precaution. PT evaluation: no HHPT.   Dehydration. Improved with IV fluids  #Recent right foot surgery Follow-up podiatry Dr. [name] and nonweightbearing on the right foot at this time.  # Accelerated hypertension Resumed lisinopril and hydrochlorothiazide,  Treated with IV hydralazine when necessary. Added norvasc. Better controlled.  #History of hyperlipidemia Resumed fenofibrate.  DISCHARGE CONDITIONS:   Stable, discharge to home today.  CONSULTS OBTAINED:  Treatment Team:  Albertine Patricia, DPM  DRUG ALLERGIES:    Allergies  Allergen Reactions  . Demerol [Meperidine] Nausea And Vomiting    hives  . Penicillins Swelling    Hives     DISCHARGE MEDICATIONS:   Current Discharge Medication List    START taking these medications   Details  amLODipine (NORVASC) 10 MG tablet Take 1 tablet (10 mg total) by mouth daily. Qty: 30 tablet, Refills: 0      CONTINUE these medications which have NOT CHANGED   Details  ALPHAGAN P 0.1 % SOLN Place 1 drop into both eyes daily.     amoxicillin (AMOXIL) 500 MG capsule Take 2,000 mg by mouth as needed (prior to dental procedures).    cetirizine (ZYRTEC) 10 MG tablet Take 10 mg by mouth daily.    donepezil (ARICEPT) 10 MG tablet Take 10 mg by mouth at bedtime.    fenofibrate 160 MG tablet TAKE ONE TABLET BY MOUTH DAILY Qty: 90 tablet, Refills: 2    FLUoxetine (PROZAC) 20 MG capsule Take 1 capsule (20 mg total) by mouth daily. Qty: 90 capsule, Refills: 1    gabapentin (NEURONTIN) 800 MG tablet Take 1 tablet (800 mg total) by mouth 3 (three) times daily as needed. Qty: 270 tablet, Refills: 3    HYDROcodone-acetaminophen (NORCO/VICODIN) 5-325 MG tablet Limit  2-4 tablets by mouth per day for breakthrough pain while wearing fentanyl patch if tolerated Qty: 120 tablet, Refills: 0    hydroxychloroquine (PLAQUENIL) 200 MG tablet Take 200 mg by mouth daily.    lisinopril-hydrochlorothiazide (PRINZIDE,ZESTORETIC) 20-25 MG tablet TAKE ONE TABLET BY MOUTH EVERY DAY Qty: 90 tablet, Refills: 3  naproxen (NAPROSYN) 500 MG tablet Take 500 mg by mouth 2 (two) times daily with a meal.    predniSONE (DELTASONE) 5 MG tablet Take 5-10 mg by mouth daily as needed.    simvastatin (ZOCOR) 40 MG tablet Take 40 mg by mouth at bedtime.     tamsulosin (FLOMAX) 0.4 MG CAPS capsule Take 1 capsule (0.4 mg total) by mouth daily. Qty: 90 capsule, Refills: 1    traZODone (DESYREL) 50 MG tablet Take 50 mg by mouth at bedtime as needed for sleep. Reported on 10/09/2015     vitamin B-12 (CYANOCOBALAMIN) 1000 MCG tablet Take 1,000 mcg by mouth daily.      STOP taking these medications     fentaNYL (DURAGESIC - DOSED MCG/HR) 25 MCG/HR patch      lisinopril (PRINIVIL,ZESTRIL) 20 MG tablet      polyethylene glycol powder (GLYCOLAX/MIRALAX) powder          DISCHARGE INSTRUCTIONS:    If you experience worsening of your admission symptoms, develop shortness of breath, life threatening emergency, suicidal or homicidal thoughts you must seek medical attention immediately by calling 911 or calling your MD immediately  if symptoms less severe.  You Must read complete instructions/literature along with all the possible adverse reactions/side effects for all the Medicines you take and that have been prescribed to you. Take any new Medicines after you have completely understood and accept all the possible adverse reactions/side effects.   Please note  You were cared for by a hospitalist during your hospital stay. If you have any questions about your discharge medications or the care you received while you were in the hospital after you are discharged, you can call the unit and asked to speak with the hospitalist on call if the hospitalist that took care of you is not available. Once you are discharged, your primary care physician will handle any further medical issues. Please note that NO REFILLS for any discharge medications will be authorized once you are discharged, as it is imperative that you return to your primary care physician (or establish a relationship with a primary care physician if you do not have one) for your aftercare needs so that they can reassess your need for medications and monitor your lab values.    Today   SUBJECTIVE    No complaint.  VITAL SIGNS:  Blood pressure 151/89, pulse 86, temperature 98.1 F (36.7 C), temperature source Oral, resp. rate 17, height 6' (1.829 m), weight 93.169 kg (205 lb 6.4 oz), SpO2 100 %.  I/O:    Intake/Output Summary (Last 24 hours) at 10/13/15 1356 Last data filed at 10/13/15 1143  Gross per 24 hour  Intake    240 ml  Output      0 ml  Net    240 ml    PHYSICAL EXAMINATION:  GENERAL:  75 y.o.-year-old patient lying in the bed with no acute distress.  EYES: Pupils equal, round, reactive to light and accommodation. No scleral icterus. Extraocular muscles intact.  HEENT: Head atraumatic, normocephalic. Oropharynx and nasopharynx clear.  NECK:  Supple, no jugular venous distention. No thyroid enlargement, no tenderness.  LUNGS: Normal breath sounds bilaterally, no wheezing, rales,rhonchi or crepitation. No use of accessory muscles of respiration.  CARDIOVASCULAR: S1, S2 normal. No murmurs, rubs, or gallops.  ABDOMEN: Soft, non-tender, non-distended. Bowel sounds present. No organomegaly or mass.  EXTREMITIES: No pedal edema, cyanosis, or clubbing.  NEUROLOGIC: Cranial nerves II through XII are intact. Muscle strength 5/5 in all extremities.  Sensation intact. Gait not checked.  PSYCHIATRIC: The patient is alert and oriented x 3.  SKIN: No obvious rash, lesion, or ulcer.   DATA REVIEW:   CBC  Recent Labs Lab 10/12/15 0345  WBC 7.2  HGB 12.4*  HCT 36.9*  PLT 117*    Chemistries   Recent Labs Lab 10/11/15 1537  10/13/15 0422  NA 138  < > 134*  K 3.8  < > 3.4*  CL 105  < > 99*  CO2 24  < > 24  GLUCOSE 111*  < > 125*  BUN 25*  < > 20  CREATININE 1.26*  < > 1.08  CALCIUM 9.1  < > 9.5  AST 39  --   --   ALT 26  --   --   ALKPHOS 46  --   --   BILITOT 0.6  --   --   < > = values in this interval not displayed.  Cardiac Enzymes  Recent Labs Lab 10/11/15 1537  TROPONINI <0.03    Microbiology Results  No results found for this or any previous visit.  RADIOLOGY:  Dg Chest 1 View  10/11/2015  CLINICAL DATA:  Altered mental status EXAM: CHEST 1 VIEW COMPARISON:  01/30/2014 chest radiograph. FINDINGS: Sternotomy wires appear aligned and intact. Stable  cardiomediastinal silhouette with top-normal heart size. No pneumothorax. No pleural effusion. Lungs appear clear, with no acute consolidative airspace disease and no pulmonary edema. IMPRESSION: No active disease. Electronically Signed   By: Ilona Sorrel M.D.   On: 10/11/2015 14:42   Ct Head Wo Contrast  10/11/2015  CLINICAL DATA:  75 year old male with history of headache EXAM: CT HEAD WITHOUT CONTRAST TECHNIQUE: Contiguous axial images were obtained from the base of the skull through the vertex without intravenous contrast. COMPARISON:  CT 03/28/2014, MR 07/26/2014 FINDINGS: Unremarkable appearance of the calvarium without acute fracture or aggressive lesion. Unremarkable appearance of the scalp soft tissues. Unremarkable appearance of the bilateral orbits. Mastoid air cells are clear. No significant paranasal sinus disease No acute intracranial hemorrhage, midline shift, or mass effect. Gray-white differentiation is maintained, without CT evidence of acute ischemia. Unremarkable configuration of the ventricles. IMPRESSION: No CT evidence of acute intracranial abnormality. Signed, Dulcy Fanny. Earleen Newport, DO [facility] Radiology Electronically Signed   By: Corrie Mckusick D.O.   On: 10/11/2015 14:59   Ct Abdomen Pelvis W Contrast  10/11/2015  CLINICAL DATA:  Bilious vomiting and altered mental status. EXAM: CT ABDOMEN AND PELVIS WITH CONTRAST TECHNIQUE: Multidetector CT imaging of the abdomen and pelvis was performed using the standard protocol following bolus administration of intravenous contrast. CONTRAST:  142mL ISOVUE-300 IOPAMIDOL (ISOVUE-300) INJECTION 61% COMPARISON:  None. FINDINGS: Lower chest: No significant pulmonary nodules or acute consolidative airspace disease. There is a discontinuity in the lower most sternotomy wire. 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 Hepatobiliary: Normal liver with no liver mass. Cholecystectomy.  Minimal prominence of the central intrahepatic bile ducts and common bile duct diameter 8 mm, within expected post cholecystectomy limits. No radiopaque choledocholithiasis. Pancreas: Normal, with no mass or duct dilation. Spleen: Normal size. No mass. Adrenals/Urinary Tract: Normal adrenals. Simple exophytic 1.5 cm renal cyst in the posterior mid to upper right kidney. Simple exophytic 1.9 cm renal cyst in the upper left kidney. No hydronephrosis . Normal bladder. Stomach/Bowel: Probable tiny hiatal hernia. Otherwise collapsed and grossly normal stomach. Normal caliber small bowel with no small bowel wall thickening. Appendectomy. Mild diverticulosis throughout the  colon, with no large bowel wall thickening or pericolonic fat stranding. Vascular/Lymphatic: Atherosclerotic nonaneurysmal abdominal aorta. Patent portal, splenic, hepatic and renal veins. No pathologically enlarged lymph nodes in the abdomen or pelvis. Reproductive: Top-normal size prostate with central prostatic defect suggestive of prior TURP. Other: No pneumoperitoneum.  Trace free fluid in the deep pelvis. Musculoskeletal: No aggressive appearing focal osseous lesions. Moderate degenerative changes in the visualized thoracolumbar spine. IMPRESSION: 1. No evidence of bowel obstruction or acute bowel inflammation. Mild colonic diverticulosis, with no evidence of acute diverticulitis. 2. Nonspecific trace free fluid in the deep pelvis. 3. Probable tiny hiatal hernia. Electronically Signed   By: Ilona Sorrel M.D.   On: 10/11/2015 19:34        Management plans discussed with the patient, family and they are in agreement.  CODE STATUS:     Code Status Orders        Start     Ordered   10/11/15 2214  Do not attempt resuscitation (DNR)   Continuous    Question Answer Comment  In the event of cardiac or respiratory ARREST Do not call a "code blue"   In the event of cardiac or respiratory ARREST Do not perform Intubation, CPR,  defibrillation or ACLS   In the event of cardiac or respiratory ARREST Use medication by any route, position, wound care, and other measures to relive pain and suffering. May use oxygen, suction and manual treatment of airway obstruction as needed for comfort.   Comments RN may pronounce      10/11/15 2213    Code Status History    Date Active Date Inactive Code Status Order ID Comments User Context   This patient has a current code status but no historical code status.    Advance Directive Documentation        Most Recent Value   Type of Advance Directive  Living will   Pre-existing out of facility DNR order (yellow form or pink MOST form)     "MOST" Form in Place?        TOTAL TIME TAKING CARE OF THIS PATIENT: 33 minutes.    Demetrios Loll M.D on 10/13/2015 at 1:56 PM  Between 7am to 6pm - Pager - [phone]  After 6pm go to [email or web address] - password [email or web address] [facility]  Office  [phone]  CC: Primary care physician; Rubbie Battiest, NP

## 2015-10-13 NOTE — Progress Notes (Signed)
Pt up unassisted, discussed safety concerns, NWB to operative foot. Pt states he is leaving as soon as his wife gets here, refuses breakfast. Informed pt Dr. [name] to see him before leaving hospital, pt says he will call Dr. [name] himself. Requests AMA paperwork. Wife to be here at [place].

## 2015-10-13 NOTE — Progress Notes (Signed)
Dressing changed by Dr. [name] at bedside. Pt discharged to home with wife. Meds and d/c instructions reviewed. Follow up with Dr. [name] next Tuesday.

## 2015-10-14 ENCOUNTER — Telehealth: Payer: Self-pay

## 2015-10-14 NOTE — Telephone Encounter (Signed)
Transition Care Management Follow-up Telephone Call   Date discharged? 10/13/15   How have you been since you were released from the hospital? Fatigued. Sleepy.  Appetite has decreased a little.  No complaints of pain.   Do you understand why you were in the hospital? Confusion.  Loss of memory.  Dehydration.  Hypoxia.   Do you understand the discharge instructions? YES, increasing activities as tolerated, drinking plenty of fluids.   Where were you discharged to? HOME.   Items Reviewed:  Medications reviewed:  YES, STARTING amlodipine 10mg  tomorrow.  STOPPED Lisinopril 20mg , MIRALAX powder without issues.  Continuing to take all other medications as scheduled without issues  Allergies reviewed: YES, Demerol Penicillins.  Dietary changes reviewed: YES, having a hard time eating a heart healthy diet.  Prefers a regular diet.  Referrals reviewed: YES, appointment scheduled with podiatry and PCP.   Functional Questionnaire:    Activities of Daily Living (ADLs):   He states they are independent in the following: Bathing, dressing, self feeding, grooming, meal prep. States they require assistance with the following: Ambulating (cane/walker in use)   Any transportation issues/concerns?: NO, not at this time.   Any patient concerns? No, not at this time.   Confirmed importance and date/time of follow-up visits scheduled YES, appointment scheduled 10/17/15 at 2:00  Provider Appointment booked with Dr. Caryl Bis (PCP).  Confirmed with patient if condition begins to worsen call PCP or go to the ER.  Patient was given the office number and encouraged to call back with question or concerns.  : YES, verbalized understanding.

## 2015-10-17 ENCOUNTER — Ambulatory Visit
Admission: EM | Admit: 2015-10-17 | Discharge: 2015-10-17 | Disposition: A | Discharge status: Home / Self Care | Payer: Medicare Other | Attending: Internal Medicine | Admitting: Internal Medicine

## 2015-10-17 ENCOUNTER — Encounter: Payer: Self-pay | Admitting: *Deleted

## 2015-10-17 ENCOUNTER — Telehealth: Payer: Self-pay | Admitting: Family Medicine

## 2015-10-17 ENCOUNTER — Ambulatory Visit (INDEPENDENT_AMBULATORY_CARE_PROVIDER_SITE_OTHER): Payer: Medicare Other | Admitting: Family Medicine

## 2015-10-17 ENCOUNTER — Encounter: Payer: Self-pay | Admitting: Family Medicine

## 2015-10-17 VITALS — BP 102/65 | HR 72 | Temp 97.8°F | Ht 71.0 in | Wt 206.6 lb

## 2015-10-17 DIAGNOSIS — E860 Dehydration: Secondary | ICD-10-CM | POA: Insufficient documentation

## 2015-10-17 DIAGNOSIS — N189 Chronic kidney disease, unspecified: Secondary | ICD-10-CM

## 2015-10-17 DIAGNOSIS — Z5189 Encounter for other specified aftercare: Secondary | ICD-10-CM | POA: Diagnosis not present

## 2015-10-17 DIAGNOSIS — I10 Essential (primary) hypertension: Secondary | ICD-10-CM

## 2015-10-17 DIAGNOSIS — R739 Hyperglycemia, unspecified: Secondary | ICD-10-CM

## 2015-10-17 DIAGNOSIS — N289 Disorder of kidney and ureter, unspecified: Secondary | ICD-10-CM

## 2015-10-17 DIAGNOSIS — R4182 Altered mental status, unspecified: Secondary | ICD-10-CM | POA: Diagnosis not present

## 2015-10-17 DIAGNOSIS — R7309 Other abnormal glucose: Secondary | ICD-10-CM | POA: Diagnosis not present

## 2015-10-17 LAB — HEMOGLOBIN A1C: Hgb A1c MFr Bld: 5.8 % (ref 4.6–6.5)

## 2015-10-17 LAB — BASIC METABOLIC PANEL
BUN: 59 mg/dL — ABNORMAL HIGH (ref 6–23)
CO2: 23 mEq/L (ref 19–32)
Calcium: 9.3 mg/dL (ref 8.4–10.5)
Chloride: 104 mEq/L (ref 96–112)
Creatinine, Ser: 1.93 mg/dL — ABNORMAL HIGH (ref 0.40–1.50)
GFR: 36.03 mL/min — ABNORMAL LOW (ref >60.00)
Glucose, Bld: 137 mg/dL — ABNORMAL HIGH (ref 70–99)
Potassium: 4.3 mEq/L (ref 3.5–5.1)
Sodium: 136 mEq/L (ref 135–145)

## 2015-10-17 MED ORDER — AMLODIPINE BESYLATE 10 MG PO TABS: 5.0000 mg | ORAL_TABLET | Freq: Every day | ORAL | Status: DC

## 2015-10-17 MED ORDER — SODIUM CHLORIDE 0.9 % IV BOLUS (SEPSIS)
500.0000 mL | Freq: Once | INTRAVENOUS | Status: AC
  Administered 2015-10-17: 500 mL via INTRAVENOUS

## 2015-10-17 NOTE — Assessment & Plan Note (Signed)
Patient hospitalized for altered mental status and dehydration over the weekend. Notes he has significantly improved at this time. Has been staying well hydrated. Vital signs are stable. Blood pressures on the low side of normal and this may be related to his new amlodipine prescription. We will decrease this in half given his elevated blood pressures in the hospital. He'll continue to stay well hydrated. We will check a BMP to follow-up on his kidney function and electrolytes. He is given return precautions.

## 2015-10-17 NOTE — ED Provider Notes (Signed)
CSN: [id number]     Arrival date & time 10/17/15  [id number] History   First MD Initiated Contact with Patient 10/17/15 1908     Chief Complaint  Patient presents with  . Weakness   HPI  Patient is a 75 year old gentleman with recent medical history notable for right foot surgery. Postop course was complicated by delirium, for which he was evaluated in the emergency room. He was markedly hypertensive, and started on amlodipine in the ER. Delirium has subsided, the patient is feeling better and is up and around.  He followed up with his primary care provider, Dr. Caryl Bis, today.  Blood pressure is noted to be low systolic, [id number], without reported dizziness/falls but does have fatigue.  Reports running several errands today with wife.  Creatinine at the PCPs office was 1.9, up from 1.08  4 days ago. Patient was referred to the urgent care for possible IV fluid hydration.   Orthostatic vital signs are reassuring.  Past Medical History  Diagnosis Date  . Coronary artery disease   . Hyperlipidemia   . H/O thoracic aortic aneurysm repair   . Valvular heart disease   . Paroxysmal a-fib ([place])   . Hypertension   . Arrhythmia   . Glaucoma   . Depression   . Allergy   . History of blood transfusion   . History of chicken pox   . Chronic back pain   . 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 Thoracic aortic aneurysm repair  2010  . Aortic valve replacement  2007    [facility].  [place]  . Cholecystectomy  2010  . Appendectomy  2010  . Replacement total knee Left 2003  . Hammer toe surgery Right 10/09/2015    Procedure: HAMMER TOE REPAIR WITH K-WIRE FIXATION RIGHT SECOND TOE;  Surgeon: Albertine Patricia, DPM;  Location: [place];  Service: Podiatry;  Laterality: Right;  WITH LOCAL   Family History  Problem Relation Age of Onset  . Heart failure Mother   . Hypertension Mother   . Asthma Mother    . Heart attack Father 75    MI  . Hypertension Sister    Social History  Substance Use Topics  . Smoking status: Former Smoker -- 1.00 packs/day for 32 years    Types: Cigarettes    Quit date: 07/05/1981  . Smokeless tobacco: None  . Alcohol Use: 0.6 oz/week    1 Glasses of wine per week    Review of Systems  All other systems reviewed and are negative.   Allergies  Demerol and Penicillins  Home Medications   Prior to Admission medications   Medication Sig Start Date End Date Taking? Authorizing Provider  ALPHAGAN P 0.1 % SOLN Place 1 drop into both eyes daily.  01/15/15  Yes Historical Provider, MD  amLODipine (NORVASC) 10 MG tablet Take 0.5 tablets (5 mg total) by mouth daily. 10/17/15  Yes [name], MD  cetirizine (ZYRTEC) 10 MG tablet Take 10 mg by mouth daily.   Yes Historical Provider, MD  donepezil (ARICEPT) 10 MG tablet Take 10 mg by mouth at bedtime.   Yes Historical Provider, MD  fenofibrate 160 MG tablet TAKE ONE TABLET BY MOUTH DAILY 03/11/15  Yes Minna Merritts, MD  FLUoxetine (PROZAC) 20 MG capsule Take 1 capsule (20 mg total) by mouth daily. 05/09/15  Yes Rubbie Battiest, NP  gabapentin (NEURONTIN) 800 MG tablet Take 1 tablet (800 mg total) by mouth 3 (three) times daily as needed. 08/27/15  Yes Mohammed Kindle, MD  HYDROcodone-acetaminophen (NORCO/VICODIN) 5-325 MG tablet Limit  2-4 tablets by mouth per day for breakthrough pain while wearing fentanyl patch if tolerated 09/25/15  Yes Mohammed Kindle, MD  hydroxychloroquine (PLAQUENIL) 200 MG tablet Take 200 mg by mouth daily.   Yes Historical Provider, MD  lisinopril-hydrochlorothiazide (PRINZIDE,ZESTORETIC) 20-25 MG tablet TAKE ONE TABLET BY MOUTH EVERY DAY 08/14/15  Yes Minna Merritts, MD  naproxen (NAPROSYN) 500 MG tablet Take 500 mg by mouth 2 (two) times daily with a meal.   Yes Historical Provider, MD  predniSONE (DELTASONE) 5 MG tablet Take 5-10 mg by mouth daily as needed.   Yes Historical Provider, MD   simvastatin (ZOCOR) 40 MG tablet Take 40 mg by mouth at bedtime.  08/08/14  Yes Historical Provider, MD  tamsulosin (FLOMAX) 0.4 MG CAPS capsule Take 1 capsule (0.4 mg total) by mouth daily. 05/09/15  Yes Rubbie Battiest, NP  traZODone (DESYREL) 50 MG tablet Take 50 mg by mouth at bedtime as needed for sleep. Reported on 10/09/2015 04/15/15  Yes Historical Provider, MD  vitamin B-12 (CYANOCOBALAMIN) 1000 MCG tablet Take 1,000 mcg by mouth daily.   Yes Historical Provider, MD  amoxicillin (AMOXIL) 500 MG capsule Take 2,000 mg by mouth as needed (prior to dental procedures).    Historical Provider, MD   Meds Ordered and Administered this Visit   Medications  sodium chloride 0.9 % bolus 500 mL (500 mLs Intravenous Given 10/17/15 1958)    BP 120/57 mmHg  Pulse 64  Temp(Src) 98 F (36.7 C) (Oral)  Resp 16  Ht 6' (1.829 m)  Wt 206 lb (93.441 kg)  BMI 27.93 kg/m2  SpO2 98% See "review visit" tab for orthostatic vitals.  Physical Exam  Constitutional: He is oriented to person, place, and time. No distress.  Alert, nicely groomed  HENT:  Head: Atraumatic.  Eyes:  Conjugate gaze, no eye redness/drainage  Neck:  Turns head freely  Cardiovascular: Normal rate and regular rhythm.   Pulmonary/Chest: No respiratory distress. He has no wheezes. He has no rales.  Lungs clear, symmetric breath sounds  Abdominal: He exhibits no distension.  Musculoskeletal: Normal range of motion.  Trace bilateral lower extremity edema  Neurological: He is alert and oriented to person, place, and time.  Skin: Skin is warm and dry.  No cyanosis  Nursing note and vitals reviewed.   ED Course  Procedures (including critical care time)  500cc iv fluid given at urgent care to address elevated creatinine/low bp.   Mental status appears to be at baseline, patient is pleasant and conversational, asks good questions.   Will ask pcp/Dr Caryl Bis to recheck creatinine next week. Continue with decreased amlodipine  dose (5mg  qd) and lisinopril/HCTZ 20-25 qd. Followup pcp as planned in late June.  MDM   1. Acute on chronic renal insufficiency (HCC)   2. Dehydration        Sherlene Shams, MD 10/17/15 2027

## 2015-10-17 NOTE — Progress Notes (Signed)
Patient ID: MICHIO BODENSCHATZ, male   DOB: Nov 17, 1940, 75 y.o.   MRN: [id number]  [name], MD Phone: [phone]  Bryan [name] is a 75 y.o. male who presents today for hospital follow-up.  Patient was admitted to the hospital over the weekend due to lethargy and dehydration. Notes he had foot surgery several weeks prior and had been taking too much pain medication. Notes he was dehydrated as well. His blood pressure was noted to be high in the hospital and he was continued on lisinopril and hydrochlorothiazide. They started him on Norvasc as well. Blood pressure this morning was 105/61. He has not been lightheaded. No chest pain or shortness of breath. He notes he feels significant better and is not lethargic anymore. He notes he has been sleeping well at home. They took him off the fentanyl patch and if continued the Vicodin. He is taking Vicodin typically 3 times a day though sometimes 4 times a day. He feels much improved from previously.  PMH: Former smoker   ROS see history of present illness  Objective  Physical Exam Filed Vitals:   10/17/15 1358  BP: 102/65  Pulse: 72  Temp: 97.8 F (36.6 C)    BP Readings from Last 3 Encounters:  10/17/15 102/65  10/13/15 151/89  10/09/15 136/59   Wt Readings from Last 3 Encounters:  10/17/15 206 lb 9.6 oz (93.713 kg)  10/11/15 205 lb 6.4 oz (93.169 kg)  10/09/15 207 lb (93.895 kg)    Physical Exam  Constitutional: He is well-developed, well-nourished, and in no distress.  HENT:  Head: Normocephalic and atraumatic.  Right Ear: External ear normal.  Left Ear: External ear normal.  Mouth/Throat: Oropharynx is clear and moist. No oropharyngeal exudate.  Cardiovascular: Normal rate, regular rhythm and normal heart sounds.   Pulmonary/Chest: Effort normal and breath sounds normal.  Neurological: He is alert.  CN 2-12 intact, 5/5 strength in bilateral biceps, triceps, grip, quads, hamstrings, plantar and dorsiflexion,  sensation to light touch intact in bilateral UE and LE, normal gait, 2+ patellar reflexes  Skin: Skin is warm and dry. He is not diaphoretic.     Assessment/Plan: Please see individual problem list.  Dehydration Patient hospitalized for altered mental status and dehydration over the weekend. Notes he has significantly improved at this time. Has been staying well hydrated. Vital signs are stable. Blood pressures on the low side of normal and this may be related to his new amlodipine prescription. We will decrease this in half given his elevated blood pressures in the hospital. He'll continue to stay well hydrated. We will check a BMP to follow-up on his kidney function and electrolytes. He is given return precautions.  Altered mental status This issue is resolved. He appears to be back at his baseline. Likely related to dehydration and narcotic use. Encouraged staying hydrated. He will stay off the fentanyl patch. Continue as needed Vicodin. Given return precautions.  Essential hypertension Elevated in the hospital. Started on new amlodipine prescription. Blood pressure low normal today. We'll decrease amlodipine to 5 mg. Continue lisinopril and HCTZ. Check BMP.    Orders Placed This Encounter  Procedures  . Basic Metabolic Panel (BMET)  . HgB A1c    Meds ordered this encounter  Medications  . amLODipine (NORVASC) 10 MG tablet    Sig: Take 0.5 tablets (5 mg total) by mouth daily.    Dispense:  30 tablet    Refill:  3    [name], MD [facility] -  [employer]

## 2015-10-17 NOTE — Patient Instructions (Signed)
Nice to meet you. You appear to be doing quite well following your hospitalization. We are going to decrease your Norvasc to 5 mg daily. Please monitor your blood pressure at home if it does not start to come up a little bit by next week please let us know. If it rises greater than 140/90 please let us know as well. If you develop drowsiness, vomiting, numbness, weakness, lightheadedness, chest pain or shortness of breath, or any new or changing symptoms please seek medical attention.

## 2015-10-17 NOTE — ED Notes (Signed)
N/V and weakness

## 2015-10-17 NOTE — Telephone Encounter (Signed)
Called and spoke with patient and wife regarding lab results. Creatinine is up from discharge. Likely represents dehydration given BUN/creatinine ratio greater than 20. Discussed options for evaluation this evening and need for likely IV fluids. Discussed that he could be evaluated in the emergency room in [place] or he could go to the urgent care in [place] to be evaluated for the need for IV fluids. He opted to go to urgent care. Advised him to go at this time.

## 2015-10-17 NOTE — Assessment & Plan Note (Signed)
Elevated in the hospital. Started on new amlodipine prescription. Blood pressure low normal today. We'll decrease amlodipine to 5 mg. Continue lisinopril and HCTZ. Check BMP.

## 2015-10-17 NOTE — Discharge Instructions (Signed)
followup with Dr Caryl Bis as planned in June. 500 cc of iv fluid given at urgent care today for low blood pressure and possible mild dehydration. Continue with reduced dose of amlodipine (5mg  daily) and lisinopril/HCTZ (20-25 daily). Have kidney function rechecked next week.

## 2015-10-17 NOTE — Assessment & Plan Note (Signed)
This issue is resolved. He appears to be back at his baseline. Likely related to dehydration and narcotic use. Encouraged staying hydrated. He will stay off the fentanyl patch. Continue as needed Vicodin. Given return precautions.

## 2015-10-17 NOTE — Progress Notes (Signed)
Pre visit review using our clinic review tool, if applicable. No additional management support is needed unless otherwise documented below in the visit note. 

## 2015-10-21 DIAGNOSIS — M2041 Other hammer toe(s) (acquired), right foot: Secondary | ICD-10-CM | POA: Diagnosis not present

## 2015-10-28 ENCOUNTER — Other Ambulatory Visit: Payer: Medicare Other

## 2015-10-28 ENCOUNTER — Encounter: Payer: Self-pay | Admitting: Pain Medicine

## 2015-10-28 ENCOUNTER — Ambulatory Visit: Payer: Medicare Other | Attending: Pain Medicine | Admitting: Pain Medicine

## 2015-10-28 VITALS — BP 122/61 | HR 63 | Temp 96.6°F | Resp 16 | Ht 72.0 in | Wt 198.0 lb

## 2015-10-28 DIAGNOSIS — M5136 Other intervertebral disc degeneration, lumbar region: Secondary | ICD-10-CM | POA: Diagnosis not present

## 2015-10-28 DIAGNOSIS — M170 Bilateral primary osteoarthritis of knee: Secondary | ICD-10-CM | POA: Diagnosis not present

## 2015-10-28 DIAGNOSIS — M06862 Other specified rheumatoid arthritis, left knee: Secondary | ICD-10-CM | POA: Insufficient documentation

## 2015-10-28 DIAGNOSIS — R413 Other amnesia: Secondary | ICD-10-CM | POA: Diagnosis not present

## 2015-10-28 DIAGNOSIS — M48061 Spinal stenosis, lumbar region without neurogenic claudication: Secondary | ICD-10-CM

## 2015-10-28 DIAGNOSIS — M5126 Other intervertebral disc displacement, lumbar region: Secondary | ICD-10-CM | POA: Insufficient documentation

## 2015-10-28 DIAGNOSIS — M461 Sacroiliitis, not elsewhere classified: Secondary | ICD-10-CM | POA: Diagnosis not present

## 2015-10-28 DIAGNOSIS — M4806 Spinal stenosis, lumbar region: Secondary | ICD-10-CM | POA: Diagnosis not present

## 2015-10-28 DIAGNOSIS — N400 Enlarged prostate without lower urinary tract symptoms: Secondary | ICD-10-CM

## 2015-10-28 DIAGNOSIS — M533 Sacrococcygeal disorders, not elsewhere classified: Secondary | ICD-10-CM

## 2015-10-28 DIAGNOSIS — Z96652 Presence of left artificial knee joint: Secondary | ICD-10-CM

## 2015-10-28 DIAGNOSIS — M5416 Radiculopathy, lumbar region: Secondary | ICD-10-CM

## 2015-10-28 DIAGNOSIS — M47816 Spondylosis without myelopathy or radiculopathy, lumbar region: Secondary | ICD-10-CM

## 2015-10-28 DIAGNOSIS — M1711 Unilateral primary osteoarthritis, right knee: Secondary | ICD-10-CM

## 2015-10-28 DIAGNOSIS — M79606 Pain in leg, unspecified: Secondary | ICD-10-CM | POA: Diagnosis present

## 2015-10-28 DIAGNOSIS — M47812 Spondylosis without myelopathy or radiculopathy, cervical region: Secondary | ICD-10-CM

## 2015-10-28 DIAGNOSIS — D369 Benign neoplasm, unspecified site: Secondary | ICD-10-CM | POA: Insufficient documentation

## 2015-10-28 DIAGNOSIS — M47817 Spondylosis without myelopathy or radiculopathy, lumbosacral region: Secondary | ICD-10-CM | POA: Diagnosis not present

## 2015-10-28 DIAGNOSIS — M4726 Other spondylosis with radiculopathy, lumbar region: Secondary | ICD-10-CM

## 2015-10-28 DIAGNOSIS — Z79899 Other long term (current) drug therapy: Secondary | ICD-10-CM

## 2015-10-28 DIAGNOSIS — M545 Low back pain: Secondary | ICD-10-CM | POA: Diagnosis present

## 2015-10-28 DIAGNOSIS — M06861 Other specified rheumatoid arthritis, right knee: Secondary | ICD-10-CM | POA: Insufficient documentation

## 2015-10-28 MED ORDER — HYDROCODONE-ACETAMINOPHEN 5-325 MG PO TABS: ORAL_TABLET | ORAL | Status: DC

## 2015-10-28 MED ORDER — FENTANYL 25 MCG/HR TD PT72
MEDICATED_PATCH | TRANSDERMAL | Status: DC
Start: 2015-10-28 — End: 2015-10-28

## 2015-10-28 NOTE — Progress Notes (Signed)
Subjective:    Patient ID: Bryan [name], male    DOB: 23-Jun-1940, 75 y.o.   MRN: [id number]  HPI  The patient is a 75 year old gentleman who returns to pain management for further evaluation and treatment of pain involving the lower back lower extremity region predominantly with diagnoses of rheumatoid arthritis with multiple arthralgias and myalgias The patient recently underwent surgery of the right foot. Following surgery of the right foot the patient was with memory loss. The patient was with evidence of confusion and memory loss since undergoing anesthesia and surgery for the right foot abnormality with surgery performed by Dr. [name]. We discussed patient's condition on today's visit and we will continue to avoid fentanyl patch. The patient will continue Neurontin and hydrocodone acetaminophen and patient is to undergo further evaluation with Dr. [name] for neurological assessment of memory impairment and general neurological condition. The patient was with understanding and agreement suggested treatment plan. The patient denied any other trauma change in events of daily living the call significant change in symptomatology. All agreed to suggested treatment plan     Review of Systems     Objective:   Physical Exam  There was tenderness of the splenius capitis and occipitalis region a mild degree with mild tenderness of the cervical facet cervical paraspinal musculature region a mild to moderate tenderness of the acromioclavicular and glenohumeral joint region. The patient was with subluxation of the digits of the hand with decreased grip strength with Tinel and Phalen's maneuver reproducing mild to moderate discomfort. No definite increased warmth erythema of the hands were noted. There was tenderness of the medial and lateral epicondyles of moderate degree as well. Palpation over the lumbar facets and thoracic facet regions reproduced pain with moderate tenderness of the thoracic  paraspinal musculature region and thoracic facet region without crepitus of the thoracic region noted. Palpation over the lumbar paraspinal musculature region lumbar facet region was with moderate tenderness to palpation with lateral bending rotation extension and palpation of the lumbar facets reproducing moderate discomfort. Without definite increased pain with dorsiflexion noted. There was tenderness of the knees noted with decreased EHL strength noted without a definite sensory deficit or dermatomal distribution detected. There was negative clonus negative Homans.. 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 A small papillary tumor could have a similar appearance.  Lumbar facet syndrome  Lumbar stenosis with neurogenic claudication  Rheumatoid arthritis  Sacroiliac joint dysfunction     PLAN   Continue present  medication Neurontin  hydrocodone acetaminophen and fentanyl patch  F/U PCP  Dr. Caryl Bis for evaliation of  BP and general medical  condition status post recent hospitalization.. Patient will see Dr. [name] this week for neurological assessment of patient including evaluation of memory loss status post anesthesia and surgery for right foot abnormalities  F/U surgical evaluation. May consider pending follow-up evaluations  F/U neurological evaluation as discussed for further evaluation of memory loss following anesthesia and surgery of the foot. Patient will see Dr. [name] as planned  F/U Dr. [name] as needed and as discussed  F/U rheumatological  evaluation as discussed  May consider radiofrequency rhizolysis or  intraspinal procedures pending response to present treatment and F/U evaluation  Please call pain management for any concerns you may have prior to scheduled return appointment

## 2015-10-28 NOTE — Progress Notes (Signed)
Safety precautions to be maintained throughout the outpatient stay will include: orient to surroundings, keep bed in low position, maintain call bell within reach at all times, provide assistance with transfer out of bed and ambulation.  

## 2015-10-28 NOTE — Patient Instructions (Addendum)
PLAN   Continue present  medication Neurontin  hydrocodone acetaminophen and fentanyl patch  F/U PCP  Dr. Caryl Bis for evaliation of  BP and general medical  condition status post recent hospitalization.. Patient will see Dr. [name] this week for neurological assessment of patient including evaluation of memory loss status post anesthesia and surgery for right foot abnormalities  F/U surgical evaluation. May consider pending follow-up evaluations  F/U neurological evaluation as discussed for further evaluation of memory loss following anesthesia and surgery of the foot. Patient will see Dr. [name] as planned  F/U Dr. [name] as needed and as discussed  F/U rheumatological evaluation as discussed  May consider radiofrequency rhizolysis or intraspinal procedures pending response to present treatment and F/U evaluation  Please call pain management for any concerns you may have prior to scheduled return appointment

## 2015-10-29 LAB — TESTOSTERONE: Testosterone: 421 ng/dL (ref 348–1197)

## 2015-10-29 LAB — HEMATOCRIT: Hematocrit: 36.7 % — ABNORMAL LOW (ref 37.5–51.0)

## 2015-10-29 LAB — PSA: Prostate Specific Ag, Serum: 0.3 ng/mL (ref 0.0–4.0)

## 2015-10-30 DIAGNOSIS — R413 Other amnesia: Secondary | ICD-10-CM | POA: Diagnosis not present

## 2015-10-30 DIAGNOSIS — G479 Sleep disorder, unspecified: Secondary | ICD-10-CM | POA: Diagnosis not present

## 2015-10-30 DIAGNOSIS — R4182 Altered mental status, unspecified: Secondary | ICD-10-CM | POA: Diagnosis not present

## 2015-11-04 ENCOUNTER — Ambulatory Visit: Payer: Medicare Other | Admitting: Family Medicine

## 2015-11-05 ENCOUNTER — Ambulatory Visit: Payer: Medicare Other | Admitting: Nurse Practitioner

## 2015-11-06 ENCOUNTER — Ambulatory Visit (INDEPENDENT_AMBULATORY_CARE_PROVIDER_SITE_OTHER): Payer: Medicare Other | Admitting: Cardiovascular Disease

## 2015-11-06 ENCOUNTER — Encounter: Payer: Self-pay | Admitting: Cardiovascular Disease

## 2015-11-06 VITALS — BP 107/64 | HR 57 | Ht 72.0 in | Wt 217.8 lb

## 2015-11-06 DIAGNOSIS — Z954 Presence of other heart-valve replacement: Secondary | ICD-10-CM

## 2015-11-06 DIAGNOSIS — I712 Thoracic aortic aneurysm, without rupture: Secondary | ICD-10-CM

## 2015-11-06 DIAGNOSIS — I6523 Occlusion and stenosis of bilateral carotid arteries: Secondary | ICD-10-CM

## 2015-11-06 DIAGNOSIS — I2510 Atherosclerotic heart disease of native coronary artery without angina pectoris: Secondary | ICD-10-CM | POA: Diagnosis not present

## 2015-11-06 DIAGNOSIS — I10 Essential (primary) hypertension: Secondary | ICD-10-CM | POA: Diagnosis not present

## 2015-11-06 DIAGNOSIS — E785 Hyperlipidemia, unspecified: Secondary | ICD-10-CM

## 2015-11-06 DIAGNOSIS — I7121 Aneurysm of the ascending aorta, without rupture: Secondary | ICD-10-CM

## 2015-11-06 DIAGNOSIS — R4182 Altered mental status, unspecified: Secondary | ICD-10-CM

## 2015-11-06 DIAGNOSIS — I480 Paroxysmal atrial fibrillation: Secondary | ICD-10-CM | POA: Diagnosis not present

## 2015-11-06 DIAGNOSIS — Z952 Presence of prosthetic heart valve: Secondary | ICD-10-CM

## 2015-11-06 DIAGNOSIS — N179 Acute kidney failure, unspecified: Secondary | ICD-10-CM | POA: Insufficient documentation

## 2015-11-06 NOTE — Progress Notes (Signed)
Patient ID: Bryan [name], male   DOB: Dec 19, 1940, 75 y.o.   MRN: [id number] Cardiology Office Note  Date:  11/06/2015   ID:  Bryan [name], DOB [birth year], MRN [id number]  PCP:  [name], MD   Chief Complaint  Patient presents with  . Atrial Fibrillation    ankle swelling, per pt    HPI:  Mr Bryan [name] is a pleasant 75 year old gentleman with a history of Ascending aorta aneurysm, bicuspid aortic valve noted in 2009 with cardiac catheterization at that time showing mild to moderate CAD, who underwent aVR with bioprosthetic valve, and ascending aorta grafting in 2010, history of hypertension, hyperlipidemia, mild chronic renal insufficiency, total knee replacement in 2004, appendix rupture and gallbladder surgery in 2010, who presents for routine follow-up of his bioprosthetic aortic valve. Remote history of postoperative atrial fibrillation and 2010 He is a retired Software engineer  In follow-up today he reports having recent foot surgery 10/13/2015 Postoperatively had delirium, went to the emergency room found to have very elevated blood pressures, started on amlodipine. Creatinine was 1.9 well above his baseline Blood pressure has been running low, amlodipine dose has been cut back, still running low Wife reports he is easy to get irritated Having severe right leg pain and hip down to the knee, trying to get into see orthopedics He now feels back to his baseline He is concerned about renal function He reports having worsening ankle swelling since amlodipine was started  EKG on today's visit shows normal sinus rhythm with rate 62 bpm, ST and T wave abnormality in inferior leads, V6, no change from prior EKG  Last echocardiogram June 2016 reviewed with him showing stable aorta  Other past medical history  Echocardiogram June 2016 discussed with him showing normal LV function, valve gradient consistent with bioprosthetic valve, moderately dilated ascending aorta, stable, 4.4  cm  Prior hemoglobin A1c 6.0 , cholesterol 150  Echocardiogram July 2015 showing mild to moderate aortic valve stenosis, 4.5 cm ascending aorta. No prior records available  Notes indicate some degree of carotid disease though details are not available.  PMH:   has a past medical history of Coronary artery disease; Hyperlipidemia; H/O thoracic aortic aneurysm repair; Valvular heart disease; Paroxysmal a-fib ([place]); Hypertension; Arrhythmia; Glaucoma; Depression; Allergy; History of blood transfusion; History of chicken pox; Chronic back pain; Arthritis; and History of being hospitalized.  PSH:    Past Surgical History  Procedure Laterality Date  . Thoracic aortic aneurysm repair  2010  . Aortic valve replacement  2007    [facility].  [place]  . Cholecystectomy  2010  . Appendectomy  2010  . Replacement total knee Left 2003  . Hammer toe surgery Right 10/09/2015    Procedure: HAMMER TOE REPAIR WITH K-WIRE FIXATION RIGHT SECOND TOE;  Surgeon: Albertine Patricia, DPM;  Location: [place];  Service: Podiatry;  Laterality: Right;  WITH LOCAL    Current Outpatient Prescriptions  Medication Sig Dispense Refill  . ALPHAGAN P 0.1 % SOLN Place 1 drop into both eyes daily.     Marland Kitchen amLODipine (NORVASC) 10 MG tablet Take 0.5 tablets (5 mg total) by mouth daily. 30 tablet 3  . amoxicillin (AMOXIL) 500 MG capsule Take 2,000 mg by mouth as needed (prior to dental procedures).    Marland Kitchen aspirin (GOODSENSE ASPIRIN) 325 MG tablet Take 325 mg by mouth daily.    . cetirizine (ZYRTEC) 10 MG tablet Take 10 mg by mouth daily.    Marland Kitchen donepezil (ARICEPT) 10 MG  tablet Take 10 mg by mouth at bedtime.    . fenofibrate 160 MG tablet TAKE ONE TABLET BY MOUTH DAILY 90 tablet 2  . fentaNYL (DURAGESIC - DOSED MCG/HR) 25 MCG/HR patch Place 25 mcg onto the skin every 3 (three) days.    Marland Kitchen FLUoxetine (PROZAC) 20 MG capsule Take 1 capsule (20 mg total) by mouth daily. 90 capsule 1  . gabapentin (NEURONTIN) 800 MG  tablet Take 1 tablet (800 mg total) by mouth 3 (three) times daily as needed. 270 tablet 3  . HYDROcodone-acetaminophen (NORCO/VICODIN) 5-325 MG tablet Limit  2-4 tablets by mouth per day for breakthrough pain while wearing fentanyl patch if tolerated 120 tablet 0  . hydroxychloroquine (PLAQUENIL) 200 MG tablet Take 200 mg by mouth daily.    Marland Kitchen lisinopril-hydrochlorothiazide (PRINZIDE,ZESTORETIC) 20-25 MG tablet TAKE ONE TABLET BY MOUTH EVERY DAY 90 tablet 3  . naproxen (NAPROSYN) 500 MG tablet Take 500 mg by mouth 2 (two) times daily with a meal.    . predniSONE (DELTASONE) 5 MG tablet Take 5-10 mg by mouth daily as needed.    . simvastatin (ZOCOR) 40 MG tablet Take 40 mg by mouth at bedtime.     . tamsulosin (FLOMAX) 0.4 MG CAPS capsule Take 1 capsule (0.4 mg total) by mouth daily. 90 capsule 1  . traZODone (DESYREL) 50 MG tablet Take 50-100 mg by mouth at bedtime as needed for sleep. Reported on 10/09/2015    . vitamin B-12 (CYANOCOBALAMIN) 1000 MCG tablet Take 1,000 mcg by mouth daily.     No current facility-administered medications for this visit.     Allergies:   Demerol and Penicillins   Social History:  The patient  reports that he quit smoking about 34 years ago. His smoking use included Cigarettes. He has a 32 pack-year smoking history. He does not have any smokeless tobacco history on file. He reports that he drinks about 0.6 oz of alcohol per week. He reports that he does not use illicit drugs.   Family History:   family history includes Asthma in his mother; Heart attack (age of onset: 99) in his father; Heart failure in his mother; Hypertension in his mother and sister.    Review of Systems: Review of Systems  Constitutional: Negative.   Respiratory: Negative.   Cardiovascular: Negative.   Gastrointestinal: Negative.   Musculoskeletal: Positive for joint pain.  Neurological: Negative.   Psychiatric/Behavioral: Negative.   All other systems reviewed and are  negative.    PHYSICAL EXAM: VS:  BP 107/64 mmHg  Pulse 57  Ht 6' (1.829 m)  Wt 217 lb 12.8 oz (98.793 kg)  BMI 29.53 kg/m2  SpO2 94% , BMI Body mass index is 29.53 kg/(m^2). GEN: Well nourished, well developed, in no acute distress HEENT: normal Neck: no JVD, carotid bruits, or masses Cardiac: RRR; 2+ murmurs,no rubs, or gallops, trace edema around the ankles Respiratory:  clear to auscultation bilaterally, normal work of breathing GI: soft, nontender, nondistended, + BS MS: no deformity or atrophy Skin: warm and dry, no rash Neuro:  Strength and sensation are intact Psych: euthymic mood, full affect    Recent Labs: 07/09/2015: TSH 1.420 10/11/2015: ALT 26 10/12/2015: Hemoglobin 12.4*; Platelets 117* 10/17/2015: BUN 59*; Creatinine, Ser 1.93*; Potassium 4.3; Sodium 136    Lipid Panel Lab Results  Component Value Date   CHOL 122 05/30/2015   HDL 54.20 05/30/2015   LDLCALC 55 05/30/2015   TRIG 63.0 05/30/2015      Wt Readings from Last  3 Encounters:  11/06/15 217 lb 12.8 oz (98.793 kg)  10/28/15 198 lb (89.812 kg)  10/17/15 206 lb (93.441 kg)       ASSESSMENT AND PLAN:  Paroxysmal atrial fibrillation ([place]) - Plan: EKG 12-Lead Maintaining normal sinus rhythm.  Hyperlipidemia Tolerating simvastatin. No changes to his medication  Essential hypertension Blood pressure running low, will stop amlodipine. Also having ankle swelling  CAD in native artery Currently with no symptoms of angina. No further workup at this time. Continue current medication regimen.  Aneurysm, ascending aorta ([facility]) Repeat echocardiogram ordered Prior echo in June 2016 discussed with him in detail  Carotid stenosis, bilateral Stressed importance of aggressive cholesterol control, goal LDL less than 70  S/P AVR (aortic valve replacement) Repeat echocardiogram ordered to evaluate prosthetic valve  Delirium By the notes, appears to have postoperative delirium Back to his  baseline  Acute renal failure Creatinine up to 1.92 weeks ago We will repeat BMP today   Total encounter time more than 25 minutes  Greater than 50% was spent in counseling and coordination of care with the patient   Disposition:   F/U  6 months   Orders Placed This Encounter  Procedures  . EKG 12-Lead     Signed, Esmond Plants, M.D., Ph.D. 11/06/2015  [place], [place]

## 2015-11-06 NOTE — Patient Instructions (Addendum)
You are doing well.  Please stop the amlodipine  Monitor BP at home  BMP today  Repeat echo for AVR and ascending aorta aneurysm Your physician has requested that you have an echocardiogram. Echocardiography is a painless test that uses sound waves to create images of your heart. It provides your doctor with information about the size and shape of your heart and how well your heart's chambers and valves are working. This procedure takes approximately one hour. There are no restrictions for this procedure.  Date & time: _________________________   Please call us if you have new issues that need to be addressed before your next appt.  Your physician wants you to follow-up in: 6 months.  You will receive a reminder letter in the mail two months in advance. If you don't receive a letter, please call our office to schedule the follow-up appointment.  Echocardiogram An echocardiogram, or echocardiography, uses sound waves (ultrasound) to produce an image of your heart. The echocardiogram is simple, painless, obtained within a short period of time, and offers valuable information to your health care provider. The images from an echocardiogram can provide information such as:  Evidence of coronary artery disease (CAD).  Heart size.  Heart muscle function.  Heart valve function.  Aneurysm detection.  Evidence of a past heart attack.  Fluid buildup around the heart.  Heart muscle thickening.  Assess heart valve function. LET [facility] CARE PROVIDER KNOW ABOUT:  Any allergies you have.  All medicines you are taking, including vitamins, herbs, eye drops, creams, and over-the-counter medicines.  Previous problems you or members of your family have had with the use of anesthetics.  Any blood disorders you have.  Previous surgeries you have had.  Medical conditions you have.  Possibility of pregnancy, if this applies. BEFORE THE PROCEDURE  No special preparation is needed.  Eat and drink normally.  PROCEDURE   In order to produce an image of your heart, gel will be applied to your chest and a wand-like tool (transducer) will be moved over your chest. The gel will help transmit the sound waves from the transducer. The sound waves will harmlessly bounce off your heart to allow the heart images to be captured in real-time motion. These images will then be recorded.  You may need an IV to receive a medicine that improves the quality of the pictures. AFTER THE PROCEDURE You may return to your normal schedule including diet, activities, and medicines, unless your health care provider tells you otherwise.   This information is not intended to replace advice given to you by your health care provider. Make sure you discuss any questions you have with your health care provider.   Document Released: 05/07/2000 Document Revised: 05/31/2014 Document Reviewed: 01/15/2013 Elsevier Interactive Patient Education [employer].

## 2015-11-07 ENCOUNTER — Ambulatory Visit: Payer: Medicare Other | Admitting: Nurse Practitioner

## 2015-11-07 LAB — BASIC METABOLIC PANEL
BUN/Creatinine Ratio: 20 (ref 10–24)
BUN: 34 mg/dL — ABNORMAL HIGH (ref 8–27)
CO2: 22 mmol/L (ref 18–29)
Calcium: 9.1 mg/dL (ref 8.6–10.2)
Chloride: 100 mmol/L (ref 96–106)
Creatinine, Ser: 1.72 mg/dL — ABNORMAL HIGH (ref 0.76–1.27)
GFR calc Af Amer: 43 mL/min/{1.73_m2} — ABNORMAL LOW (ref >59)
GFR calc non Af Amer: 38 mL/min/{1.73_m2} — ABNORMAL LOW (ref >59)
Glucose: 94 mg/dL (ref 65–99)
Potassium: 4.8 mmol/L (ref 3.5–5.2)
Sodium: 138 mmol/L (ref 134–144)

## 2015-11-10 ENCOUNTER — Telehealth: Payer: Self-pay | Admitting: Cardiovascular Disease

## 2015-11-10 ENCOUNTER — Telehealth: Payer: Self-pay | Admitting: *Deleted

## 2015-11-10 NOTE — Telephone Encounter (Signed)
Wants to make appt to discuss fent patches with Dr Primus Bravo and ortho referal

## 2015-11-10 NOTE — Telephone Encounter (Signed)
Pt states last week Dr. [name] took him off his Amlodipine. He would like to know if an alternative was called to his pharmacy.  States his BP today 175/75. Please call.

## 2015-11-10 NOTE — Telephone Encounter (Signed)
Pt would like for someone to give him a call. He can be reached on his cell or home number. thanks

## 2015-11-10 NOTE — Telephone Encounter (Signed)
Saw Dr. [name] on June 8, told patient he can use Fentanyl patch. Also wants referral to ortho for R. Knee and R. Hip pain.

## 2015-11-10 NOTE — Telephone Encounter (Signed)
Dena and Nurses Please inform patient that we will address resuming fentanyl patch as well as discuss orthopedic referrals for the knee and hip at time of return appointment. Also inform patient that we need written confirmation that his physician wishes for [place] to resume the fentanyl patch. Thank you very much

## 2015-11-10 NOTE — Telephone Encounter (Signed)
BP READINGS   Saturday:   2 pm 170/75   6 pm 175/68 Sunday:      2 pm 180/80  Monday:     93 0 am 175/65  4 pm  165/68   Patient stated hr has been 65-68 each time

## 2015-11-10 NOTE — Telephone Encounter (Signed)
At ov 11/06/15, pt was advised to:  "Please stop the amlodipine  Monitor BP at home"  Asked pt to call back w/ more BP readings.

## 2015-11-12 ENCOUNTER — Telehealth: Payer: Self-pay | Admitting: Cardiovascular Disease

## 2015-11-12 NOTE — Telephone Encounter (Signed)
Patient called and wanted to know what new medication he should take. Let him know that Dr. [name] had blood pressure readings and just waiting for his review and recommendations. His blood pressure this morning was 178/86 at 8AM with HR 58 and then 176/88 HR 58. Let him know that I would forward these numbers as well to Dr. [name] and someone would be in touch.

## 2015-11-12 NOTE — Telephone Encounter (Signed)
Options for blood pressure include restarting amlodipine 5 mg daily as he was taking previously This seems to cause mild swelling in his ankles on his last clinic visit   Other medication options are very limited given his heart rate is low, his kidney function continues to slowly recover limiting blood pressure medications that might hurt the kidneys  Other options for blood pressure include Cardura 4 mg twice a day (can also help her prostate issues) Or hydralazine 25 mg 3 times a day (somewhat inconvenient as it is 3 times a day)  Will let him decide on his regiment

## 2015-11-12 NOTE — Telephone Encounter (Signed)
Patient called and wants to know what is his new bp medicine is?

## 2015-11-13 ENCOUNTER — Encounter: Payer: Self-pay | Admitting: Pain Medicine

## 2015-11-13 ENCOUNTER — Ambulatory Visit: Payer: Medicare Other | Attending: Pain Medicine | Admitting: Pain Medicine

## 2015-11-13 ENCOUNTER — Telehealth: Payer: Self-pay | Admitting: Radiology

## 2015-11-13 VITALS — BP 143/74 | HR 55 | Temp 97.7°F | Resp 18 | Ht 72.0 in | Wt 203.0 lb

## 2015-11-13 DIAGNOSIS — M4726 Other spondylosis with radiculopathy, lumbar region: Secondary | ICD-10-CM

## 2015-11-13 DIAGNOSIS — R413 Other amnesia: Secondary | ICD-10-CM | POA: Diagnosis not present

## 2015-11-13 DIAGNOSIS — M5126 Other intervertebral disc displacement, lumbar region: Secondary | ICD-10-CM | POA: Insufficient documentation

## 2015-11-13 DIAGNOSIS — M170 Bilateral primary osteoarthritis of knee: Secondary | ICD-10-CM | POA: Diagnosis not present

## 2015-11-13 DIAGNOSIS — M1711 Unilateral primary osteoarthritis, right knee: Secondary | ICD-10-CM

## 2015-11-13 DIAGNOSIS — M7060 Trochanteric bursitis, unspecified hip: Secondary | ICD-10-CM | POA: Insufficient documentation

## 2015-11-13 DIAGNOSIS — M48061 Spinal stenosis, lumbar region without neurogenic claudication: Secondary | ICD-10-CM

## 2015-11-13 DIAGNOSIS — M5127 Other intervertebral disc displacement, lumbosacral region: Secondary | ICD-10-CM | POA: Insufficient documentation

## 2015-11-13 DIAGNOSIS — M069 Rheumatoid arthritis, unspecified: Secondary | ICD-10-CM | POA: Diagnosis not present

## 2015-11-13 DIAGNOSIS — M4806 Spinal stenosis, lumbar region: Secondary | ICD-10-CM | POA: Insufficient documentation

## 2015-11-13 DIAGNOSIS — M47816 Spondylosis without myelopathy or radiculopathy, lumbar region: Secondary | ICD-10-CM

## 2015-11-13 DIAGNOSIS — M461 Sacroiliitis, not elsewhere classified: Secondary | ICD-10-CM | POA: Diagnosis not present

## 2015-11-13 DIAGNOSIS — M47812 Spondylosis without myelopathy or radiculopathy, cervical region: Secondary | ICD-10-CM

## 2015-11-13 DIAGNOSIS — M5136 Other intervertebral disc degeneration, lumbar region: Secondary | ICD-10-CM | POA: Diagnosis not present

## 2015-11-13 DIAGNOSIS — M47817 Spondylosis without myelopathy or radiculopathy, lumbosacral region: Secondary | ICD-10-CM | POA: Diagnosis not present

## 2015-11-13 DIAGNOSIS — Z96652 Presence of left artificial knee joint: Secondary | ICD-10-CM

## 2015-11-13 DIAGNOSIS — M542 Cervicalgia: Secondary | ICD-10-CM | POA: Diagnosis present

## 2015-11-13 DIAGNOSIS — M533 Sacrococcygeal disorders, not elsewhere classified: Secondary | ICD-10-CM

## 2015-11-13 DIAGNOSIS — M546 Pain in thoracic spine: Secondary | ICD-10-CM | POA: Diagnosis present

## 2015-11-13 DIAGNOSIS — M5416 Radiculopathy, lumbar region: Secondary | ICD-10-CM | POA: Diagnosis not present

## 2015-11-13 MED ORDER — FENTANYL 25 MCG/HR TD PT72
MEDICATED_PATCH | TRANSDERMAL | Status: DC
Start: 2015-11-13 — End: 2015-11-28

## 2015-11-13 MED ORDER — HYDRALAZINE HCL 25 MG PO TABS: 25.0000 mg | ORAL_TABLET | Freq: Three times a day (TID) | ORAL | Status: DC

## 2015-11-13 NOTE — Progress Notes (Signed)
Subjective:    Patient ID: Bryan [name], male    DOB: [birth year], 75 y.o.   MRN: [id number]  HPI  The patient is a 75 year old gentleman who returns to pain management Center for further evaluation and treatment of pain involving the neck entire back upper and lower extremity regions. The patient is with diagnoses of rheumatoid arthritis. The patient underwent recent surgery of the foot by Dr. [name] and patient waswith memory impairment following the surgery and anesthesia for the treatment of the foot. On today's visit patient returns and has permission from Dr. [name] to resume fentanyl patch. We will resume fentanyl patch and patient will continue Neurontin and hydrocodone acetaminophen as discussed. The patient denies any additional confusion or change in events of daily living the call significant change in symptomatology. The patient stated that he had severe pain involving the region of the hip and that he wishes to undergo interventional treatment of pain involving the right hip. We discussed patient's condition and patient was scheduled to undergo greater trochanteric bursa injection in the region of the greater trochanteric region on the right. The patient denies trauma change in events of daily living the call significant change in symptomatology. We will continue medications as prescribed and we will will resume fentanyl patch and patient will call pain management should they be change in condition prior to scheduled return appointment.  Review of Systems     Objective:   Physical Exam   There was tenderness over the paraspinal misreading cervical region cervical facet region with tenderness of the acromioclavicular and glenohumeral joint region palpation of these regions reproduce moderate discomfort. The patient appeared to be with decreased grip strength with subluxation of the digits of the hand without increased warmth and erythema in the region of the digits. There was  tends to palpation over the region of the thoracic region thoracic facet region with no crepitus of the thoracic region noted. There was tenderness over the lumbar paraspinal musculatures and lumbar facet region palpation which reproduces moderate discomfort. There was severe tenderness to palpation of the greater trochanteric region iliotibial band region. Straight leg raising was tolerates approximately 20. EHL strength appeared to be decreased. There was tenderness to palpation of the knees noted with severe tenderness of the greater trochanteric region iliotibial band region especially on the right knee. There was negative Homans. 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 A small papillary tumor could have a similar appearance.  Lumbar facet syndrome  Lumbar stenosis with neurogenic claudication  Rheumatoid arthritis  Sacroiliac joint dysfunction      PLAN   Continue present  medication Neurontin  hydrocodone acetaminophen and MAY RESUME  fentanyl patch as per Dr. [name]  Greater trochanteric bursa injection to be performed at time of return appointment  F/U PCP  Dr. Caryl Bis for evaliation of  BP and general medical  condition status post recent hospitalization   F/U surgical evaluation.   F/U neurological evaluation as discussed for further evaluation of  memory loss following  anesthesia and surgery of the foot. Patient will continue follow-up evaluations with Dr. [name]. Patient has received approval to resume fentanyl patch by Dr. [name]  F/U Dr. [name] as needed and as discussed  F/U rheumatological evaluation as discussed  May consider radiofrequency rhizolysis or intraspinal procedures pending response to present treatment and F/U evaluation  Please call pain management for any concerns you may have prior to scheduled return appointment

## 2015-11-13 NOTE — Patient Instructions (Addendum)
PLAN   Continue present  medication Neurontin  hydrocodone acetaminophen and MAY RESUME  fentanyl patch as per Dr. [name]  Greater trochanteric bursa injection to be performed at time of return appointment  F/U PCP  Dr. Caryl Bis for evaliation of  BP and general medical  condition status post recent hospitalization..  F/U surgical evaluation.   F/U neurological evaluation as discussed for further evaluation of memory loss following anesthesia and surgery of the foot. Patient will continue follow-up evaluations with Dr. [name]. Patient has received approval to resume fentanyl patch by Dr. [name]  F/U Dr. [name] as needed and as discussed  F/U rheumatological evaluation as discussed  May consider radiofrequency rhizolysis or intraspinal procedures pending response to present treatment and F/U evaluation  Please call pain management for any concerns you may have prior to scheduled return appointmentPain Management Discharge Instructions  General Discharge Instructions :  If you need to reach your doctor call: Monday-Friday 8:00 am - 4:00 pm at [phone] or toll free [phone].  After clinic hours [phone] to have operator reach doctor.  Bring all of your medication bottles to all your appointments in the pain clinic.  To cancel or reschedule your appointment with Pain Management please remember to call 24 hours in advance to avoid a fee.  Refer to the educational materials which you have been given on: General Risks, I had my Procedure. Discharge Instructions, Post Sedation.  Post Procedure Instructions:  The drugs you were given will stay in your system until tomorrow, so for the next 24 hours you should not drive, make any legal decisions or drink any alcoholic beverages.  You may eat anything you prefer, but it is better to start with liquids then soups and crackers, and gradually work up to solid foods.  Please notify your doctor immediately if you have any  unusual bleeding, trouble breathing or pain that is not related to your normal pain.  Depending on the type of procedure that was done, some parts of your body may feel week and/or numb.  This usually clears up by tonight or the next day.  Walk with the use of an assistive device or accompanied by an adult for the 24 hours.  You may use ice on the affected area for the first 24 hours.  Put ice in a Ziploc bag and cover with a towel and place against area 15 minutes on 15 minutes off.  You may switch to heat after 24 hours.Trigger Point Injection Trigger points are areas where you have muscle pain. A trigger point injection is a shot given in the trigger point to relieve that pain. A trigger point might feel like a knot in your muscle. It hurts to press on a trigger point. Sometimes the pain spreads out (radiates) to other parts of the body. For example, pressing on a trigger point in your shoulder might cause pain in your arm or neck. You might have one trigger point. Or, you might have more than one. People often have trigger points in their upper back and lower back. They also occur often in the neck and shoulders. Pain from a trigger point lasts for a long time. It can make it hard to keep moving. You might not be able to do the exercise or physical therapy that could help you deal with the pain. A trigger point injection may help. It does not work for everyone. But, it may relieve your pain for a few days or a few months. A trigger point  injection does not cure long-lasting (chronic) pain. LET YOUR CAREGIVER KNOW ABOUT:  Any allergies (especially to latex, lidocaine, or steroids).  Blood-thinning medicines that you take. These drugs can lead to bleeding or bruising after an injection. They include:  Aspirin.  Ibuprofen.  Clopidogrel.  Warfarin.  Other medicines you take. This includes all vitamins, herbs, eyedrops, over-the-counter medicines, and creams.  Use of steroids.  Recent  infections.  Past problems with numbing medicines.  Bleeding problems.  Surgeries you have had.  Other health problems. RISKS AND COMPLICATIONS A trigger point injection is a safe treatment. However, problems may develop, such as:  Minor side effects usually go away in 1 to 2 days. These may include:  Soreness.  Bruising.  Stiffness.  More serious problems are rare. But, they may include:  Bleeding under the skin (hematoma).  Skin infection.  Breaking off of the needle under your skin.  Lung puncture.  The trigger point injection may not work for you. BEFORE THE PROCEDURE You may need to stop taking any medicine that thins your blood. This is to prevent bleeding and bruising. Usually these medicines are stopped several days before the injection. No other preparation is needed. PROCEDURE  A trigger point injection can be given in your caregiver's office or in a clinic. Each injection takes 2 minutes or less.  Your caregiver will feel for trigger points. The caregiver may use a marker to circle the area for the injection.  The skin over the trigger point will be washed with a germ-killing (antiseptic) solution.  The caregiver pinches the spot for the injection.  Then, a very thin needle is used for the shot. You may feel pain or a twitching feeling when the needle enters the trigger point.  A numbing solution may be injected into the trigger point. Sometimes a drug to keep down swelling, redness, and warmth (inflammation) is also injected.  Your caregiver moves the needle around the trigger zone until the tightness and twitching goes away.  After the injection, your caregiver may put gentle pressure over the injection site.  Then it is covered with a bandage. AFTER THE PROCEDURE  You can go right home after the injection.  The bandage can be taken off after a few hours.  You may feel sore and stiff for 1 to 2 days.  Go back to your regular activities slowly.  Your caregiver may ask you to stretch your muscles. Do not do anything that takes extra energy for a few days.  Follow your caregiver's instructions to manage and treat other pain.   This information is not intended to replace advice given to you by your health care provider. Make sure you discuss any questions you have with your health care provider.   Document Released: 04/29/2011 Document Revised: 09/04/2012 Document Reviewed: 04/29/2011 Elsevier Interactive Patient Education 2016 [place]  What are the risk, side effects and possible complications? Generally speaking, most procedures are safe.  However, with any procedure there are risks, side effects, and the possibility of complications.  The risks and complications are dependent upon the sites that are lesioned, or the type of nerve block to be performed.  The closer the procedure is to the spine, the more serious the risks are.  Great care is taken when placing the radio frequency needles, block needles or lesioning probes, but sometimes complications can occur.  Infection: Any time there is an injection through the skin, there is a risk of infection.  This is why sterile conditions are used for these blocks.  There are four possible types of infection.  Localized skin infection.  Central Nervous System Infection-This can be in the form of Meningitis, which can be deadly.  Epidural Infections-This can be in the form of an epidural abscess, which can cause pressure inside of the spine, causing compression of the spinal cord with subsequent paralysis. This would require an emergency surgery to decompress, and there are no guarantees that the patient would recover from the paralysis.  Discitis-This is an infection of the intervertebral discs.  It occurs in about 1% of discography procedures.  It is difficult to treat and it may lead to surgery.        2. Pain: the needles have to go through skin and  soft tissues, will cause soreness.       3. Damage to internal structures:  The nerves to be lesioned may be near blood vessels or    other nerves which can be potentially damaged.       4. Bleeding: Bleeding is more common if the patient is taking blood thinners such as  aspirin, Coumadin, Ticiid, Plavix, etc., or if he/she have some genetic predisposition  such as hemophilia. Bleeding into the spinal canal can cause compression of the spinal  cord with subsequent paralysis.  This would require an emergency surgery to  decompress and there are no guarantees that the patient would recover from the  paralysis.       5. Pneumothorax:  Puncturing of a lung is a possibility, every time a needle is introduced in  the area of the chest or upper back.  Pneumothorax refers to free air around the  collapsed lung(s), inside of the thoracic cavity (chest cavity).  Another two possible  complications related to a similar event would include: Hemothorax and Chylothorax.   These are variations of the Pneumothorax, where instead of air around the collapsed  lung(s), you may have blood or chyle, respectively.       6. Spinal headaches: They may occur with any procedures in the area of the spine.       7. Persistent CSF (Cerebro-Spinal Fluid) leakage: This is a rare problem, but may occur  with prolonged intrathecal or epidural catheters either due to the formation of a fistulous  track or a dural tear.       8. Nerve damage: By working so close to the spinal cord, there is always a possibility of  nerve damage, which could be as serious as a permanent spinal cord injury with  paralysis.       9. Death:  Although rare, severe deadly allergic reactions known as "Anaphylactic  reaction" can occur to any of the medications used.      10. Worsening of the symptoms:  We can always make thing worse.  What are the chances of something like this happening? Chances of any of this occuring are extremely low.  By statistics, you  have more of a chance of getting killed in a motor vehicle accident: while driving to the hospital than any of the above occurring .  Nevertheless, you should be aware that they are possibilities.  In general, it is similar to taking a shower.  Everybody knows that you can slip, hit your head and get killed.  Does that mean that you should not shower again?  Nevertheless always keep in mind that statistics do not mean anything if you happen to be on the  wrong side of them.  Even if a procedure has a 1 (one) in a 1,000,000 (million) chance of going wrong, it you happen to be that one..Also, keep in mind that by statistics, you have more of a chance of having something go wrong when taking medications.  Who should not have this procedure? If you are on a blood thinning medication (e.g. Coumadin, Plavix, see list of "Blood Thinners"), or if you have an active infection going on, you should not have the procedure.  If you are taking any blood thinners, please inform your physician.  How should I prepare for this procedure?  Do not eat or drink anything at least six hours prior to the procedure.  Bring a driver with you .  It cannot be a taxi.  Come accompanied by an adult that can drive you back, and that is strong enough to help you if your legs get weak or numb from the local anesthetic.  Take all of your medicines the morning of the procedure with just enough water to swallow them.  If you have diabetes, make sure that you are scheduled to have your procedure done first thing in the morning, whenever possible.  If you have diabetes, take only half of your insulin dose and notify our nurse that you have done so as soon as you arrive at the clinic.  If you are diabetic, but only take blood sugar pills (oral hypoglycemic), then do not take them on the morning of your procedure.  You may take them after you have had the procedure.  Do not take aspirin or any aspirin-containing medications, at least  eleven (11) days prior to the procedure.  They may prolong bleeding.  Wear loose fitting clothing that may be easy to take off and that you would not mind if it got stained with Betadine or blood.  Do not wear any jewelry or perfume  Remove any nail coloring.  It will interfere with some of our monitoring equipment.  NOTE: Remember that this is not meant to be interpreted as a complete list of all possible complications.  Unforeseen problems may occur.  BLOOD THINNERS The following drugs contain aspirin or other products, which can cause increased bleeding during surgery and should not be taken for 2 weeks prior to and 1 week after surgery.  If you should need take something for relief of minor pain, you may take acetaminophen which is found in Tylenol,m Datril, Anacin-3 and Panadol. It is not blood thinner. The products listed below are.  Do not take any of the products listed below in addition to any listed on your instruction sheet.  A.P.C or A.P.C with Codeine Codeine Phosphate Capsules #3 Ibuprofen Ridaura  ABC compound Congesprin Imuran rimadil  Advil Cope Indocin Robaxisal  Alka-Seltzer Effervescent Pain Reliever and Antacid Coricidin or Coricidin-D  Indomethacin Rufen  Alka-Seltzer plus Cold Medicine Cosprin Ketoprofen S-A-C Tablets  Anacin Analgesic Tablets or Capsules Coumadin Korlgesic Salflex  Anacin Extra Strength Analgesic tablets or capsules CP-2 Tablets Lanoril Salicylate  Anaprox Cuprimine Capsules Levenox Salocol  Anexsia-D Dalteparin Magan Salsalate  Anodynos Darvon compound Magnesium Salicylate Sine-off  Ansaid Dasin Capsules Magsal Sodium Salicylate  Anturane Depen Capsules Marnal Soma  APF Arthritis pain formula Dewitt's Pills Measurin Stanback  Argesic Dia-Gesic Meclofenamic Sulfinpyrazone  Arthritis Bayer Timed Release Aspirin Diclofenac Meclomen Sulindac  Arthritis pain formula Anacin Dicumarol Medipren Supac  Analgesic (Safety coated) Arthralgen Diffunasal  Mefanamic Suprofen  Arthritis Strength Bufferin Dihydrocodeine Mepro Compound Suprol  Arthropan  liquid Dopirydamole Methcarbomol with Aspirin Synalgos  ASA tablets/Enseals Disalcid Micrainin Tagament  Ascriptin Doan's Midol Talwin  Ascriptin A/D Dolene Mobidin Tanderil  Ascriptin Extra Strength Dolobid Moblgesic Ticlid  Ascriptin with Codeine Doloprin or Doloprin with Codeine Momentum Tolectin  Asperbuf Duoprin Mono-gesic Trendar  Aspergum Duradyne Motrin or Motrin IB Triminicin  Aspirin plain, buffered or enteric coated Durasal Myochrisine Trigesic  Aspirin Suppositories Easprin Nalfon Trillsate  Aspirin with Codeine Ecotrin Regular or Extra Strength Naprosyn Uracel  Atromid-S Efficin Naproxen Ursinus  Auranofin Capsules Elmiron Neocylate Vanquish  Axotal Emagrin Norgesic Verin  Azathioprine Empirin or Empirin with Codeine Normiflo Vitamin E  Azolid Emprazil Nuprin Voltaren  Bayer Aspirin plain, buffered or children's or timed BC Tablets or powders Encaprin Orgaran Warfarin Sodium  Buff-a-Comp Enoxaparin Orudis Zorpin  Buff-a-Comp with Codeine Equegesic Os-Cal-Gesic   Buffaprin Excedrin plain, buffered or Extra Strength Oxalid   Bufferin Arthritis Strength Feldene Oxphenbutazone   Bufferin plain or Extra Strength Feldene Capsules Oxycodone with Aspirin   Bufferin with Codeine Fenoprofen Fenoprofen Pabalate or Pabalate-SF   Buffets II Flogesic Panagesic   Buffinol plain or Extra Strength Florinal or Florinal with Codeine Panwarfarin   Buf-Tabs Flurbiprofen Penicillamine   Butalbital Compound Four-way cold tablets Penicillin   Butazolidin Fragmin Pepto-Bismol   Carbenicillin Geminisyn Percodan   Carna Arthritis Reliever Geopen Persantine   Carprofen Gold's salt Persistin   Chloramphenicol Goody's Phenylbutazone   Chloromycetin Haltrain Piroxlcam   Clmetidine heparin Plaquenil   Cllnoril Hyco-pap Ponstel   Clofibrate Hydroxy chloroquine Propoxyphen         Before stopping any of  these medications, be sure to consult the physician who ordered them.  Some, such as Coumadin (Warfarin) are ordered to prevent or treat serious conditions such as "deep thrombosis", "pumonary embolisms", and other heart problems.  The amount of time that you may need off of the medication may also vary with the medication and the reason for which you were taking it.  If you are taking any of these medications, please make sure you notify your pain physician before you undergo any procedures.

## 2015-11-13 NOTE — Telephone Encounter (Signed)
Pt was told he has an enlarged prostate at several other Dr's offices. He states he has never been told this by [facility]. Dr [name] wants to change BP meds due to enlarged prostate. He wants to know why they think he has this problem. Please advise. Pt's call back number is [phone].

## 2015-11-13 NOTE — Telephone Encounter (Signed)
Spoke w/ pt.   Advised him of Dr. Donivan Scull recommendation.  He would prefer to try hydralazine 25 mg TID, as "it's cheaper". He is also currently taking lisinopril-HCTZ 20/25 mg QAM, he would like to know if he can take lisinopril 20 mg in the evening, as he previously took this w/ good results. Pt is to call the office in a few days w/ BP readings.

## 2015-11-13 NOTE — Progress Notes (Signed)
Safety precautions to be maintained throughout the outpatient stay will include: orient to surroundings, keep bed in low position, maintain call bell within reach at all times, provide assistance with transfer out of bed and ambulation.  

## 2015-11-14 ENCOUNTER — Telehealth: Payer: Self-pay | Admitting: Cardiovascular Disease

## 2015-11-14 NOTE — Telephone Encounter (Signed)
Please see previous phone note.  

## 2015-11-14 NOTE — Telephone Encounter (Signed)
Patient states if we do not call him back today he is going to change meds and start taking bp meds he was previously taking tonight.

## 2015-11-14 NOTE — Telephone Encounter (Signed)
Bryan [name] at 11/14/2015 1:10 PM               Patient states if we do not call him back today he is going to change meds and start taking bp meds he was previously taking tonight.             Bryan [name] at 11/14/2015 1:06 PM             Patient says bp meds were changed yesterday and about 5 doses the diastolic is running 80 and above   HR is now increased to 70's-80's   Patient does not think hydralazine is working.   6/22 Yesterday afternoon 190/85 HR 85 Last night 169/85 HR 75  6/23 Earlier today 165/83 HR 79 . Just now 145/80 HR 80.  Please call to discuss.

## 2015-11-14 NOTE — Telephone Encounter (Signed)
Patient says bp meds were changed yesterday and about 5 doses the diastolic is running 80 and above   HR is now increased to 70's-80's   Patient does not think hydralazine is working.   6/22  Yesterday afternoon 190/85  HR 85   Last night 169/85  HR 75   6/23    Earlier today 165/83 HR 79   .  Just now 145/80  HR 80.  Please call to discuss.

## 2015-11-14 NOTE — Telephone Encounter (Signed)
  He can take additional lisinopril 20 mg in the evening, total of 40 mg daily is maximum  Hydralazine 25 is a low dose It is cheap, needs to be taken on a frequent basis Other doses are 50 mg, 100 mg

## 2015-11-14 NOTE — Telephone Encounter (Signed)
Spoke w/ pt.  Advised him of Dr. Donivan Scull recommendation.  He verbalizes understanding and will call next week w/ BP readings if his #s do not improve.

## 2015-11-17 ENCOUNTER — Encounter: Payer: Self-pay | Admitting: Pain Medicine

## 2015-11-17 ENCOUNTER — Ambulatory Visit: Payer: Medicare Other | Attending: Pain Medicine | Admitting: Pain Medicine

## 2015-11-17 DIAGNOSIS — M48061 Spinal stenosis, lumbar region without neurogenic claudication: Secondary | ICD-10-CM

## 2015-11-17 DIAGNOSIS — M169 Osteoarthritis of hip, unspecified: Secondary | ICD-10-CM | POA: Diagnosis not present

## 2015-11-17 DIAGNOSIS — M1711 Unilateral primary osteoarthritis, right knee: Secondary | ICD-10-CM

## 2015-11-17 DIAGNOSIS — M7061 Trochanteric bursitis, right hip: Secondary | ICD-10-CM | POA: Insufficient documentation

## 2015-11-17 DIAGNOSIS — M5416 Radiculopathy, lumbar region: Secondary | ICD-10-CM

## 2015-11-17 DIAGNOSIS — M47816 Spondylosis without myelopathy or radiculopathy, lumbar region: Secondary | ICD-10-CM

## 2015-11-17 DIAGNOSIS — M47812 Spondylosis without myelopathy or radiculopathy, cervical region: Secondary | ICD-10-CM

## 2015-11-17 DIAGNOSIS — Z96652 Presence of left artificial knee joint: Secondary | ICD-10-CM

## 2015-11-17 DIAGNOSIS — M4726 Other spondylosis with radiculopathy, lumbar region: Secondary | ICD-10-CM

## 2015-11-17 DIAGNOSIS — M533 Sacrococcygeal disorders, not elsewhere classified: Secondary | ICD-10-CM

## 2015-11-17 MED ORDER — CIPROFLOXACIN HCL 250 MG PO TABS: 250.0000 mg | ORAL_TABLET | Freq: Two times a day (BID) | ORAL | Status: DC

## 2015-11-17 MED ORDER — SODIUM CHLORIDE 0.9 % IJ SOLN
INTRAMUSCULAR | Status: AC
  Filled 2015-11-17: qty 10

## 2015-11-17 MED ORDER — SODIUM CHLORIDE 0.9% FLUSH
20.0000 mL | Freq: Once | INTRAVENOUS | Status: AC
  Administered 2015-11-17: 20 mL

## 2015-11-17 MED ORDER — BUPIVACAINE HCL (PF) 0.25 % IJ SOLN
30.0000 mL | Freq: Once | INTRAMUSCULAR | Status: AC
  Administered 2015-11-17: 30 mL
  Filled 2015-11-17: qty 30

## 2015-11-17 MED ORDER — TRIAMCINOLONE ACETONIDE 40 MG/ML IJ SUSP
40.0000 mg | Freq: Once | INTRAMUSCULAR | Status: AC
  Administered 2015-11-17: 40 mg
  Filled 2015-11-17: qty 1

## 2015-11-17 NOTE — Patient Instructions (Addendum)
PLAN   Continue present  medication Neurontin  hydrocodone acetaminophen and  fentanyl patch  Please obtain Cipro antibiotic today and begin taking Cipro antibiotic today as prescribed  F/U PCP  Dr. Caryl Bis for evaliation of  BP and general medical  condition status post recent hospitalization..  F/U surgical evaluation.   F/U neurological evaluation as discussed for further evaluation of memory loss following anesthesia and surgery of the foot. Patient will continue follow-up evaluations with Dr. [name]  F/U Dr. [name] as needed and as discussed  F/U rheumatological evaluation as discussed  May consider radiofrequency rhizolysis or intraspinal procedures pending response to present treatment and F/U evaluation  Please call pain management for any concerns you may have prior to scheduled return appointmentPain Management Discharge Instructions  General Discharge Instructions :  If you need to reach your doctor call: Monday-Friday 8:00 am - 4:00 pm at [phone] or toll free [phone].  After clinic hours [phone] to have operator reach doctor.  Bring all of your medication bottles to all your appointments in the pain clinic.  To cancel or reschedule your appointment with Pain Management please remember to call 24 hours in advance to avoid a fee.  Refer to the educational materials which you have been given on: General Risks, I had my Procedure. Discharge Instructions, Post Sedation.  Post Procedure Instructions:  The drugs you were given will stay in your system until tomorrow, so for the next 24 hours you should not drive, make any legal decisions or drink any alcoholic beverages.  You may eat anything you prefer, but it is better to start with liquids then soups and crackers, and gradually work up to solid foods.  Please notify your doctor immediately if you have any unusual bleeding, trouble breathing or pain that is not related to your normal pain.  Depending  on the type of procedure that was done, some parts of your body may feel week and/or numb.  This usually clears up by tonight or the next day.  Walk with the use of an assistive device or accompanied by an adult for the 24 hours.  You may use ice on the affected area for the first 24 hours.  Put ice in a Ziploc bag and cover with a towel and place against area 15 minutes on 15 minutes off.  You may switch to heat after 24 hours.

## 2015-11-17 NOTE — Progress Notes (Signed)
Safety precautions to be maintained throughout the outpatient stay will include: orient to surroundings, keep bed in low position, maintain call bell within reach at all times, provide assistance with transfer out of bed and ambulation.  

## 2015-11-17 NOTE — Telephone Encounter (Signed)
Spoke with patient concerning his prostate.

## 2015-11-17 NOTE — Progress Notes (Signed)
   Subjective:    Patient ID: Bryan [name], male    DOB: [birth year], 75 y.o.   MRN: [id number]  HPI                                                                    GREATER TROCHANTERIC BURSA  INJECTION     The patient is a 75  -year-old gentleman who returns to pain management for further evaluation and treatment of pain involving the greater trochanteric region. The patient is with  severely disabling pain reproduced  by palpation of  the greater  trochanteric region on the right. There is concern regarding the patient's pain being due to a significant component of greater trochanteric bursitis and iliotibial band syndrome. The risk, benefits, and expectations of the procedure were discussed with and explained to the patient who was with understanding and in agreement with the suggested treatment plan.       Description Of Procedure:  Right Greater Trochanteric Bursa Injection  The patient was positioned in the lateral decubitus position. EKG, blood pressure, pulse, and pulse oximetry monitors were all in place. Identification of landmarks for needle entry for the procedure was accomplished and Betadine prep of the proposed needle entry site was accomplished.  A 22-gauge needle was inserted and 5 cc of 0.25% bupivacaine with Kenalog was injected. The needle was removed. The needle was reinserted in the region of the greater trochanter and in additional 5 cc of 0.25% bupivacaine with Kenalog was injected for right greater trochanteric bursa injection times two. The needle was remove.   The patient tolerated the procedure well  A total of 20 milligrams of Kenalog was utilized for the procedure     PLAN  Continue present medications  Neurontin  fentanyl patch and hydrocodone acetaminophen  F/U PCP Dr. Caryl Bis for evaliation of  BP and general medical  condition  F/U surgical evaluation. Has been addressed  F/U neurological evaluation. May consider pending follow-up  evaluations  F/U Dr. [name] as discussed  May consider radiofrequency rhizolysis or intraspinal procedures pending response to present treatment and F/U evaluation   Patient to call Pain Management Center should patient have concerns prior to scheduled return appointment.    Review of Systems     Objective:   Physical Exam        Assessment & Plan:

## 2015-11-18 ENCOUNTER — Telehealth: Payer: Self-pay | Admitting: *Deleted

## 2015-11-18 NOTE — Telephone Encounter (Signed)
Left voicemail for patient to call our office if there are questions or concerns re; procedure on yesterday.  

## 2015-11-21 ENCOUNTER — Encounter: Payer: Self-pay | Admitting: Family Medicine

## 2015-11-21 ENCOUNTER — Ambulatory Visit (INDEPENDENT_AMBULATORY_CARE_PROVIDER_SITE_OTHER): Payer: Medicare Other | Admitting: Family Medicine

## 2015-11-21 VITALS — BP 126/64 | HR 67 | Temp 98.2°F | Ht 72.0 in | Wt 212.0 lb

## 2015-11-21 DIAGNOSIS — I10 Essential (primary) hypertension: Secondary | ICD-10-CM

## 2015-11-21 DIAGNOSIS — R809 Proteinuria, unspecified: Secondary | ICD-10-CM

## 2015-11-21 DIAGNOSIS — N179 Acute kidney failure, unspecified: Secondary | ICD-10-CM

## 2015-11-21 DIAGNOSIS — N138 Other obstructive and reflux uropathy: Secondary | ICD-10-CM

## 2015-11-21 DIAGNOSIS — N401 Enlarged prostate with lower urinary tract symptoms: Secondary | ICD-10-CM

## 2015-11-21 DIAGNOSIS — I6523 Occlusion and stenosis of bilateral carotid arteries: Secondary | ICD-10-CM | POA: Diagnosis not present

## 2015-11-21 DIAGNOSIS — R7989 Other specified abnormal findings of blood chemistry: Secondary | ICD-10-CM

## 2015-11-21 DIAGNOSIS — R748 Abnormal levels of other serum enzymes: Secondary | ICD-10-CM | POA: Diagnosis not present

## 2015-11-21 LAB — BASIC METABOLIC PANEL
BUN: 35 mg/dL — ABNORMAL HIGH (ref 6–23)
CO2: 26 mEq/L (ref 19–32)
Calcium: 9.4 mg/dL (ref 8.4–10.5)
Chloride: 103 mEq/L (ref 96–112)
Creatinine, Ser: 1.7 mg/dL — ABNORMAL HIGH (ref 0.40–1.50)
GFR: 41.7 mL/min — ABNORMAL LOW (ref >60.00)
Glucose, Bld: 99 mg/dL (ref 70–99)
Potassium: 4.9 mEq/L (ref 3.5–5.1)
Sodium: 136 mEq/L (ref 135–145)

## 2015-11-21 LAB — POCT URINALYSIS DIPSTICK
Blood, UA: NEGATIVE
Glucose, UA: NEGATIVE
Leukocytes, UA: NEGATIVE
Nitrite, UA: NEGATIVE
Protein, UA: 100
Spec Grav, UA: 1.02
Urobilinogen, UA: 0.2
pH, UA: 5.5

## 2015-11-21 NOTE — Addendum Note (Signed)
Addended by: Frutoso Chase A on: 11/21/2015 04:20 PM   Modules accepted: Orders

## 2015-11-21 NOTE — Progress Notes (Signed)
Pre visit review using our clinic review tool, if applicable. No additional management support is needed unless otherwise documented below in the visit note. 

## 2015-11-21 NOTE — Assessment & Plan Note (Signed)
Patient with increased creatinine in the setting of prior dehydration. Has been hydrating well recently. Could be acute on chronic kidney dysfunction. We will recheck BMP today. Also check UA for protein as he had protein previously. If continues to have proteinuria or elevated creatinine would consider nephrology referral.

## 2015-11-21 NOTE — Patient Instructions (Signed)
Nice to see you. We are going to recheck her kidney function. We'll also check her urine for protein. Please continue to monitor your blood pressure. Please continue your Flomax and follow-up your urologist as scheduled.

## 2015-11-21 NOTE — Assessment & Plan Note (Signed)
Well-controlled today. No lightheadedness. Continue lisinopril and HCTZ and hydralazine per cardiology.

## 2015-11-21 NOTE — Assessment & Plan Note (Signed)
Stable on Flomax. Continue to follow with urology.

## 2015-11-21 NOTE — Progress Notes (Signed)
Patient ID: Bryan [name], male   DOB: 1941/03/21, 75 y.o.   MRN: [id number]  [name], MD Phone: [phone]  Bryan [name] is a 75 y.o. male who presents today for follow-up.  Acute kidney injury: Patient seen last visit and noted to have elevated creatinine following hospitalization for dehydration. Notes this was rechecked several weeks ago and was noted to be slightly improved though still high. He denies any history of prior kidney disease. No NSAID use. He is drinking plenty of fluids. Of note his last UA did have some protein.  HYPERTENSION Disease Monitoring Home BP Monitoring reports well controlled Chest pain- no    Dyspnea- no Medications Compliance-  taking lisinopril, HCTZ, and hydralazine. Recently stopped amlodipine at cardiology visit.. Lightheadedness-  no  Edema- no  Patient reports he is on Flomax from his urologist. Possible BPH. Mild starting and stopping. Nocturia couple times. Reports he is comfortable with his current state of this issue.   PMH: Former smoker   ROS see history of present illness  Objective  Physical Exam Filed Vitals:   11/21/15 1456  BP: 126/64  Pulse: 67  Temp: 98.2 F (36.8 C)    BP Readings from Last 3 Encounters:  11/21/15 126/64  11/17/15 132/58  11/13/15 143/74   Wt Readings from Last 3 Encounters:  11/21/15 212 lb (96.163 kg)  11/17/15 204 lb (92.534 kg)  11/13/15 203 lb (92.08 kg)    Physical Exam  Constitutional: He is well-developed, well-nourished, and in no distress.  HENT:  Head: Normocephalic and atraumatic.  Cardiovascular: Normal rate, regular rhythm and normal heart sounds.   Pulmonary/Chest: Effort normal and breath sounds normal.  Musculoskeletal: He exhibits no edema.  Neurological: He is alert. Gait normal.  Skin: Skin is warm and dry. He is not diaphoretic.     Assessment/Plan: Please see individual problem list.  Acute renal failure ([place]) Patient with increased creatinine  in the setting of prior dehydration. Has been hydrating well recently. Could be acute on chronic kidney dysfunction. We will recheck BMP today. Also check UA for protein as he had protein previously. If continues to have proteinuria or elevated creatinine would consider nephrology referral.  Essential hypertension Well-controlled today. No lightheadedness. Continue lisinopril and HCTZ and hydralazine per cardiology.  BPH with obstruction/lower urinary tract symptoms Stable on Flomax. Continue to follow with urology.    Orders Placed This Encounter  Procedures  . Basic Metabolic Panel (BMET)  . POCT Urinalysis Dipstick    [name], MD [place]

## 2015-11-21 NOTE — Addendum Note (Signed)
Addended by: Frutoso Chase A on: 11/21/2015 04:22 PM   Modules accepted: Orders

## 2015-11-26 ENCOUNTER — Other Ambulatory Visit: Payer: Self-pay | Admitting: Family Medicine

## 2015-11-26 DIAGNOSIS — N183 Chronic kidney disease, stage 3 unspecified: Secondary | ICD-10-CM

## 2015-11-27 ENCOUNTER — Ambulatory Visit (INDEPENDENT_AMBULATORY_CARE_PROVIDER_SITE_OTHER): Payer: Medicare Other

## 2015-11-27 ENCOUNTER — Other Ambulatory Visit: Payer: Self-pay

## 2015-11-27 ENCOUNTER — Telehealth: Payer: Self-pay

## 2015-11-27 ENCOUNTER — Ambulatory Visit: Payer: Medicare Other | Admitting: Pain Medicine

## 2015-11-27 DIAGNOSIS — I480 Paroxysmal atrial fibrillation: Secondary | ICD-10-CM

## 2015-11-27 DIAGNOSIS — E785 Hyperlipidemia, unspecified: Secondary | ICD-10-CM | POA: Diagnosis not present

## 2015-11-27 DIAGNOSIS — I712 Thoracic aortic aneurysm, without rupture: Secondary | ICD-10-CM

## 2015-11-27 DIAGNOSIS — Z954 Presence of other heart-valve replacement: Secondary | ICD-10-CM

## 2015-11-27 DIAGNOSIS — I2510 Atherosclerotic heart disease of native coronary artery without angina pectoris: Secondary | ICD-10-CM | POA: Diagnosis not present

## 2015-11-27 DIAGNOSIS — I10 Essential (primary) hypertension: Secondary | ICD-10-CM | POA: Diagnosis not present

## 2015-11-27 DIAGNOSIS — I7121 Aneurysm of the ascending aorta, without rupture: Secondary | ICD-10-CM

## 2015-11-27 DIAGNOSIS — I6523 Occlusion and stenosis of bilateral carotid arteries: Secondary | ICD-10-CM

## 2015-11-27 DIAGNOSIS — Z952 Presence of prosthetic heart valve: Secondary | ICD-10-CM

## 2015-11-27 LAB — ECHOCARDIOGRAM COMPLETE
AO mean calculated velocity dopler: 202 cm/s
AV Area VTI index: 0.56 cm2/m2
AV Area VTI: 1.14 cm2
AV Area mean vel: 1.19 cm2
AV Mean grad: 19 mmHg
AV Peak grad: 38 mmHg
AV VEL mean LVOT/AV: 0.34
AV area mean vel ind: 0.53 cm2/m2
AV peak Index: 0.51
AV pk vel: 307 cm/s
AV vel: 1.24
Ao pk vel: 0.33 m/s
Ao-asc: 38 cm
E decel time: 176 msec
E/e' ratio: 8.02
FS: 21 % — AB (ref 28–44)
IVS/LV PW RATIO, ED: 0.93
LA ID, A-P, ES: 42 mm
LA diam end sys: 42 mm
LA diam index: 1.88 cm/m2
LA vol A4C: 109 ml
LA vol index: 47.1 mL/m2
LA vol: 105 mL
LV E/e' medial: 8.02
LV E/e'average: 8.02
LV PW d: 9.72 mm — AB (ref 0.6–1.1)
LV e' LATERAL: 12.1 cm/s
LVOT SV: 87 mL
LVOT VTI: 25.2 cm
LVOT area: 3.46 cm2
LVOT diameter: 21 mm
LVOT peak VTI: 0.36 cm
LVOT peak vel: 101 cm/s
MV Dec: 176
MV Peak grad: 4 mmHg
MV pk A vel: 54.8 m/s
MV pk E vel: 97 m/s
TAPSE: 14.1 mm
TDI e' lateral: 12.1
TDI e' medial: 6.74
VTI: 70.1 cm
Valve area index: 0.56
Valve area: 1.24 cm2

## 2015-11-27 NOTE — Telephone Encounter (Signed)
Bryan [name], and Nurses Please schedule patient for hip injection for Friday, 11/28/2015 IF INSURANCE WILL ALLOW. Thank you

## 2015-11-27 NOTE — Telephone Encounter (Signed)
Pt wants to have a procedure tomorrow pt says he has been in pain since last procedure he wants Dr Primus Bravo to be aware of this before the he comes in to see Dr Primus Bravo

## 2015-11-28 ENCOUNTER — Ambulatory Visit: Payer: Medicare Other | Attending: Pain Medicine | Admitting: Pain Medicine

## 2015-11-28 ENCOUNTER — Encounter: Payer: Self-pay | Admitting: Pain Medicine

## 2015-11-28 VITALS — BP 128/55 | HR 51 | Temp 96.0°F | Resp 16 | Ht 72.0 in | Wt 205.0 lb

## 2015-11-28 DIAGNOSIS — M48061 Spinal stenosis, lumbar region without neurogenic claudication: Secondary | ICD-10-CM

## 2015-11-28 DIAGNOSIS — M47816 Spondylosis without myelopathy or radiculopathy, lumbar region: Secondary | ICD-10-CM

## 2015-11-28 DIAGNOSIS — M160 Bilateral primary osteoarthritis of hip: Secondary | ICD-10-CM

## 2015-11-28 DIAGNOSIS — M47812 Spondylosis without myelopathy or radiculopathy, cervical region: Secondary | ICD-10-CM

## 2015-11-28 DIAGNOSIS — Z96652 Presence of left artificial knee joint: Secondary | ICD-10-CM

## 2015-11-28 DIAGNOSIS — M5416 Radiculopathy, lumbar region: Secondary | ICD-10-CM

## 2015-11-28 DIAGNOSIS — M4726 Other spondylosis with radiculopathy, lumbar region: Secondary | ICD-10-CM

## 2015-11-28 DIAGNOSIS — M1711 Unilateral primary osteoarthritis, right knee: Secondary | ICD-10-CM

## 2015-11-28 DIAGNOSIS — M7060 Trochanteric bursitis, unspecified hip: Secondary | ICD-10-CM | POA: Insufficient documentation

## 2015-11-28 DIAGNOSIS — M169 Osteoarthritis of hip, unspecified: Secondary | ICD-10-CM | POA: Diagnosis not present

## 2015-11-28 DIAGNOSIS — M7061 Trochanteric bursitis, right hip: Secondary | ICD-10-CM | POA: Diagnosis not present

## 2015-11-28 DIAGNOSIS — M533 Sacrococcygeal disorders, not elsewhere classified: Secondary | ICD-10-CM

## 2015-11-28 MED ORDER — GABAPENTIN 800 MG PO TABS: ORAL_TABLET | ORAL | Status: DC

## 2015-11-28 MED ORDER — HYDROCODONE-ACETAMINOPHEN 5-325 MG PO TABS: ORAL_TABLET | ORAL | Status: DC

## 2015-11-28 MED ORDER — BUPIVACAINE HCL (PF) 0.25 % IJ SOLN
30.0000 mL | Freq: Once | INTRAMUSCULAR | Status: AC
  Administered 2015-11-28: 30 mL
  Filled 2015-11-28: qty 30

## 2015-11-28 MED ORDER — TRIAMCINOLONE ACETONIDE 40 MG/ML IJ SUSP
40.0000 mg | Freq: Once | INTRAMUSCULAR | Status: AC
  Administered 2015-11-28: 40 mg
  Filled 2015-11-28: qty 1

## 2015-11-28 MED ORDER — FENTANYL 25 MCG/HR TD PT72: MEDICATED_PATCH | TRANSDERMAL | Status: DC

## 2015-11-28 MED ORDER — CIPROFLOXACIN HCL 250 MG PO TABS: 250.0000 mg | ORAL_TABLET | Freq: Two times a day (BID) | ORAL | Status: DC

## 2015-11-28 MED ORDER — ORPHENADRINE CITRATE 30 MG/ML IJ SOLN
INTRAMUSCULAR | Status: AC
  Administered 2015-11-28: 11:00:00
  Filled 2015-11-28: qty 2

## 2015-11-28 NOTE — Patient Instructions (Addendum)
PLAN   Continue present  medication Neurontin  hydrocodone acetaminophen and  fentanyl patch  Please obtain Cipro antibiotic today and begin taking Cipro antibiotic today as prescribed  F/U PCP  Dr. Caryl Bis for evaliation of  BP and general medical  condition status post steroid injection performed. The injection can elevate your blood pressure and cause swelling of the extremities as well as other side effects. Please follow-up with Dr. Caryl Bis to monitor your general medical condition status post steroid injection as discussed  F/U surgical evaluation.   F/U neurological evaluation as discussed  Patient will continue follow-up evaluations with Dr. [name]  F/U Dr. [name] as needed and as discussed  F/U rheumatological evaluation as discussed  May consider radiofrequency rhizolysis or intraspinal procedures pending response to present treatment and F/U evaluation. We will avoid such procedures at this time  Please call pain management for any concerns you may have prior to scheduled return appointmentPain Management Discharge Instructions  General Discharge Instructions :  If you need to reach your doctor call: Monday-Friday 8:00 am - 4:00 pm at [phone] or toll free [phone].  After clinic hours [phone] to have operator reach doctor.  Bring all of your medication bottles to all your appointments in the pain clinic.  To cancel or reschedule your appointment with Pain Management please remember to call 24 hours in advance to avoid a fee.  Refer to the educational materials which you have been given on: General Risks, I had my Procedure. Discharge Instructions, Post Sedation.  Post Procedure Instructions:  The drugs you were given will stay in your system until tomorrow, so for the next 24 hours you should not drive, make any legal decisions or drink any alcoholic beverages.  You may eat anything you prefer, but it is better to start with liquids then soups and  crackers, and gradually work up to solid foods.  Please notify your doctor immediately if you have any unusual bleeding, trouble breathing or pain that is not related to your normal pain.  Depending on the type of procedure that was done, some parts of your body may feel week and/or numb.  This usually clears up by tonight or the next day.  Walk with the use of an assistive device or accompanied by an adult for the 24 hours.  You may use ice on the affected area for the first 24 hours.  Put ice in a Ziploc bag and cover with a towel and place against area 15 minutes on 15 minutes off.  You may switch to heat after 24 hours.

## 2015-11-28 NOTE — Progress Notes (Signed)
Safety precautions to be maintained throughout the outpatient stay will include: orient to surroundings, keep bed in low position, maintain call bell within reach at all times, provide assistance with transfer out of bed and ambulation.  

## 2015-11-28 NOTE — Progress Notes (Signed)
   Subjective:    Patient ID: Bryan [name], male    DOB: Jul 30, 1940, 75 y.o.   MRN: [id number]  HPI                                                    RIGHT  GREATER TROCHANTERIC BURSA  INJECTION     The patient is a 55  -year-old gentleman who returns to pain management for further evaluation and treatment of pain involving the greater trochanteric region. The patient is with  severely disabling pain reproduced  by palpation of  the greater  trochanteric region.  There is concern regarding the patient's pain being due to a significant component of greater trochanteric bursitis and iliotibial band syndrome. The risk, benefits, and expectations of the procedure were discussed with and explained to the patient who was with understanding and in agreement with the suggested treatment plan.       Description Of Procedure:  Right Greater Trochanteric Bursa Injection  The patient was positioned in the lateral decubitus position. EKG, blood pressure, pulse, and pulse oximetry monitors were all in place. Identification of landmarks for needle entry for the procedure was accomplished and Betadine prep of the proposed needle entry site was accomplished.  A 22-gauge needle was inserted and 5 cc of 0.25% bupivacaine with Kenalog was injected. The needle was removed. The needle was reinserted in the region of the greater trochanter and in additional 5 cc of 0.25% bupivacaine with Kenalog was injected for right greater trochanteric bursa injection. The needle was removed.    Myoneural block injection of the gluteal musculature region Following Betadine prep of proposed entry site a 22-gauge needle was inserted into the gluteal musculature region and following negative aspiration 4 cc of 0.25% bupivacaine with Kenalog and Norflex was injected for myoneural block injection of the gluteal musculature region.    The patient tolerated the procedure well  A total of 20 milligrams of Kenalog was  utilized for the procedure     PLAN  Continue present medications Neurontin hydrocodone acetaminophen and fentanyl patch  F/U PCP f Dr. Caryl Bis or evaliation of  BP and general medical  condition  F/U surgical evaluation. May consider pending follow-up evaluations  F/U neurological evaluation. Patient will follow-up with Dr. [name] as discussed  F/U evaluation with Dr. [name] as discussed  May consider radiofrequency rhizolysis or intraspinal procedures pending response to present treatment and F/U evaluation . We will avoid such treatment at this time  Patient to call Pain Management Center should patient have concerns prior to scheduled return appointment.    Review of Systems     Objective:   Physical Exam        Assessment & Plan:

## 2015-12-04 ENCOUNTER — Telehealth: Payer: Self-pay | Admitting: Cardiovascular Disease

## 2015-12-04 NOTE — Telephone Encounter (Signed)
Carol from [facility] calling asking about medications that patient has.  Pt thinks he is to take hydralazine in the AM and  then Lisinopril in the PM  She would like to know which medication to fill for they thought it was just to replace the Hydralazine  Please advise.

## 2015-12-04 NOTE — Telephone Encounter (Signed)
Please review notes in chart, the patient is confused on what he is to take.

## 2015-12-04 NOTE — Telephone Encounter (Signed)
Bryan [name] from [facility] wanted to confirm patients medications that he is on. Per previous note he is taking lisinopril-HCTZ 20/25 mg in the AM and lisinopril 20 mg in the evening. Reviewed this with her and she verbalized understanding with no further questions at this time.

## 2015-12-08 ENCOUNTER — Other Ambulatory Visit: Payer: Self-pay | Admitting: Pain Medicine

## 2015-12-08 ENCOUNTER — Telehealth: Payer: Self-pay | Admitting: Pain Medicine

## 2015-12-08 DIAGNOSIS — M7060 Trochanteric bursitis, unspecified hip: Secondary | ICD-10-CM

## 2015-12-08 DIAGNOSIS — M1711 Unilateral primary osteoarthritis, right knee: Secondary | ICD-10-CM

## 2015-12-08 DIAGNOSIS — M48061 Spinal stenosis, lumbar region without neurogenic claudication: Secondary | ICD-10-CM

## 2015-12-08 DIAGNOSIS — M5416 Radiculopathy, lumbar region: Secondary | ICD-10-CM

## 2015-12-08 DIAGNOSIS — M47816 Spondylosis without myelopathy or radiculopathy, lumbar region: Secondary | ICD-10-CM

## 2015-12-08 DIAGNOSIS — M47812 Spondylosis without myelopathy or radiculopathy, cervical region: Secondary | ICD-10-CM

## 2015-12-08 DIAGNOSIS — M4726 Other spondylosis with radiculopathy, lumbar region: Secondary | ICD-10-CM

## 2015-12-08 DIAGNOSIS — M533 Sacrococcygeal disorders, not elsewhere classified: Secondary | ICD-10-CM

## 2015-12-08 DIAGNOSIS — Z96652 Presence of left artificial knee joint: Secondary | ICD-10-CM

## 2015-12-08 NOTE — Telephone Encounter (Signed)
Spoke with Mrs Harlin and forwarded message to Dr Primus Bravo.

## 2015-12-08 NOTE — Telephone Encounter (Signed)
Spoke with Bryan [name]'s wife and she stated that Bryan [name]'s hip injection was not successful.  States that when the numbness wore off the next and  the pain came right back.  He is having difficulty sleeping and is having to sleep sitting up in a chair.  They would like to go ahead and have referral to Dr Marry Guan.

## 2015-12-08 NOTE — Telephone Encounter (Signed)
Nurses Please have patient follow-up with PCP Dr. Caryl Bis to discuss difficulty sleeping and refer patient to Dr. Marry Guan for orthopedic evaluation of pain of the hip Thank you

## 2015-12-08 NOTE — Telephone Encounter (Signed)
Patient agrees to see ortho. Please refer to Dr. Marry Guan.

## 2015-12-08 NOTE — Telephone Encounter (Signed)
Patient wants to speak with Nurse per vmail he left on 12-08-15 at 7:28

## 2015-12-10 DIAGNOSIS — Z79899 Other long term (current) drug therapy: Secondary | ICD-10-CM | POA: Diagnosis not present

## 2015-12-10 DIAGNOSIS — M069 Rheumatoid arthritis, unspecified: Secondary | ICD-10-CM | POA: Diagnosis not present

## 2015-12-11 NOTE — Telephone Encounter (Signed)
Lelon Huh is sending the referral to Dr Marry Guan today

## 2015-12-15 ENCOUNTER — Other Ambulatory Visit: Payer: Self-pay

## 2015-12-15 MED ORDER — SIMVASTATIN 40 MG PO TABS
40.0000 mg | ORAL_TABLET | Freq: Every day | ORAL | 3 refills | Status: DC
Start: 2015-12-15 — End: 2016-12-15

## 2015-12-23 ENCOUNTER — Ambulatory Visit: Payer: Medicare Other | Attending: Pain Medicine | Admitting: Pain Medicine

## 2015-12-23 ENCOUNTER — Encounter: Payer: Self-pay | Admitting: Pain Medicine

## 2015-12-23 VITALS — BP 115/59 | HR 62 | Temp 98.1°F | Resp 16 | Ht 72.0 in | Wt 198.0 lb

## 2015-12-23 DIAGNOSIS — M5416 Radiculopathy, lumbar region: Secondary | ICD-10-CM | POA: Diagnosis not present

## 2015-12-23 DIAGNOSIS — M4807 Spinal stenosis, lumbosacral region: Secondary | ICD-10-CM | POA: Diagnosis not present

## 2015-12-23 DIAGNOSIS — R413 Other amnesia: Secondary | ICD-10-CM | POA: Diagnosis not present

## 2015-12-23 DIAGNOSIS — Z96652 Presence of left artificial knee joint: Secondary | ICD-10-CM | POA: Diagnosis not present

## 2015-12-23 DIAGNOSIS — M47812 Spondylosis without myelopathy or radiculopathy, cervical region: Secondary | ICD-10-CM

## 2015-12-23 DIAGNOSIS — M791 Myalgia: Secondary | ICD-10-CM | POA: Diagnosis not present

## 2015-12-23 DIAGNOSIS — M1990 Unspecified osteoarthritis, unspecified site: Secondary | ICD-10-CM | POA: Insufficient documentation

## 2015-12-23 DIAGNOSIS — M4802 Spinal stenosis, cervical region: Secondary | ICD-10-CM | POA: Diagnosis not present

## 2015-12-23 DIAGNOSIS — M545 Low back pain: Secondary | ICD-10-CM | POA: Diagnosis present

## 2015-12-23 DIAGNOSIS — M4806 Spinal stenosis, lumbar region: Secondary | ICD-10-CM | POA: Insufficient documentation

## 2015-12-23 DIAGNOSIS — M4726 Other spondylosis with radiculopathy, lumbar region: Secondary | ICD-10-CM

## 2015-12-23 DIAGNOSIS — M7061 Trochanteric bursitis, right hip: Secondary | ICD-10-CM

## 2015-12-23 DIAGNOSIS — M7060 Trochanteric bursitis, unspecified hip: Secondary | ICD-10-CM | POA: Diagnosis not present

## 2015-12-23 DIAGNOSIS — M7062 Trochanteric bursitis, left hip: Secondary | ICD-10-CM

## 2015-12-23 DIAGNOSIS — M533 Sacrococcygeal disorders, not elsewhere classified: Secondary | ICD-10-CM | POA: Diagnosis not present

## 2015-12-23 DIAGNOSIS — M169 Osteoarthritis of hip, unspecified: Secondary | ICD-10-CM | POA: Diagnosis not present

## 2015-12-23 DIAGNOSIS — M160 Bilateral primary osteoarthritis of hip: Secondary | ICD-10-CM

## 2015-12-23 DIAGNOSIS — M5126 Other intervertebral disc displacement, lumbar region: Secondary | ICD-10-CM | POA: Insufficient documentation

## 2015-12-23 DIAGNOSIS — M069 Rheumatoid arthritis, unspecified: Secondary | ICD-10-CM | POA: Diagnosis not present

## 2015-12-23 DIAGNOSIS — M170 Bilateral primary osteoarthritis of knee: Secondary | ICD-10-CM | POA: Diagnosis not present

## 2015-12-23 DIAGNOSIS — M5136 Other intervertebral disc degeneration, lumbar region: Secondary | ICD-10-CM | POA: Diagnosis not present

## 2015-12-23 DIAGNOSIS — M79606 Pain in leg, unspecified: Secondary | ICD-10-CM | POA: Diagnosis present

## 2015-12-23 DIAGNOSIS — M47817 Spondylosis without myelopathy or radiculopathy, lumbosacral region: Secondary | ICD-10-CM | POA: Diagnosis not present

## 2015-12-23 MED ORDER — FENTANYL 25 MCG/HR TD PT72: MEDICATED_PATCH | TRANSDERMAL | 0 refills | Status: DC

## 2015-12-23 MED ORDER — HYDROCODONE-ACETAMINOPHEN 5-325 MG PO TABS: ORAL_TABLET | ORAL | 0 refills | Status: DC

## 2015-12-23 NOTE — Progress Notes (Signed)
The patient is a 75 year old gentleman who returns to pain management for further evaluation and treatment of pain involving the lower back and lower extremity region predominantly the patient stated on today's visit that his pain involves the lower back and radiates to the lower extremities and was associated with weakness involving the right lower extremity. The symptoms were not presented at time of patient's previous visit. At time of previous visit patient presented with pain involving the lower back lower extremity region predominantly involving the region of the greater trochanteric region iliotibial band region and gluteal and piriformis musculature regions. We discussed patient's condition and at the present time we will have patient undergo neurosurgical evaluation with Dr. Saintclair Halsted at the request of the patient. We agreed that patient may benefit from neurosurgical evaluation. We will also encourage patient to undergo orthopedic evaluation by Dr. Marry Guan. At the present time patient is reluctant to scheduled appointment with Dr. Marry Guan due to his previous experience with the office personnel. We will continue medications consisting of Neurontin hydrocodone acetaminophen and Duragesic patch. We discussed patient undergoing lumbar epidural steroid injection for treatment of his condition as well as to benefit patient in terms of providing information for Dr.CramThe patient will call if he wishes to proceed with lumbar epidural steroid injection and we will proceed with such procedure pending choice of palpation. All agreed to suggested treatment plan     Physical examination  The patient was with tenderness of the paraspinal musculature region cervical region cervical facet region a moderate degree with moderate tenderness of the acromioclavicular and glenohumeral joint region. The patient was with limited range of motion of the shoulders and appeared to be with unremarkable Spurling's maneuver and  was with moderate difficulty attempting to perform drop test. There was deformity of the digits with subluxation of the digits with Tinel and Phalen's maneuver reproducing mild to moderate discomfort. The patient was with decreased grip strength. No increased warmth erythema of the digits of the hand when was noted there was tenderness of the medial and lateral epicondyles of the elbows. No increased warmth erythema of the upper extremities were noted. Palpation over the lumbar region was of increased pain of moderate degree with evidence of mild to moderate spasm of the thoracic paraspinal musculature region with no crepitus of the thoracic region noted. Lateral bending rotation extension and palpation over the lumbar facets reproduced moderate discomfort. Straight leg raising was tolerates approximately 20 without a definite increased pain with dorsiflexion noted. EHL strength appeared to be decreased. No definite sensory deficit or dermatomal distribution detected. There was negative clonus negative Homans. Abdomen nontender with no costovertebral tenderness noted. 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 There was crepitus of right knee without significant abnormalities noted with anterior and posterior drawer signs patient appeared to be negative.      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 A small papillary tumor could have a similar appearance.  Rheumatoid arthritis  Sacroiliac joint  dysfunction     PLAN   Continue present  medication Neurontin  hydrocodone acetaminophen and  fentanyl  patch   F/U PCP  Dr. Caryl Bis for evaliation of  BP and general medical  condition   F/U surgical evaluation. The patient is scheduled to see neurosurgeon Dr. Saintclair Halsted  . Recommend patient consider seeing Dr. Marry Guan as well   F/U neurological evaluation as discussed  Patient will continue follow-up evaluations with Dr. [name]  F/U Dr. [name] as needed and as discussed  F/U rheumatological evaluation as discussed  May consider radiofrequency rhizolysis or intraspinal procedures pending response to present treatment and F/U evaluation. We will avoid such procedures at this time  Please call pain management for any concerns you may have prior to scheduled return appointment

## 2015-12-23 NOTE — Progress Notes (Signed)
Safety precautions to be maintained throughout the outpatient stay will include: orient to surroundings, keep bed in low position, maintain call bell within reach at all times, provide assistance with transfer out of bed and ambulation.  

## 2015-12-23 NOTE — Patient Instructions (Addendum)
PLAN   Continue present  medication Neurontin  hydrocodone acetaminophen and  fentanyl patch   F/U PCP  Dr. Caryl Bis for evaliation of  BP and general medical  condition status post steroid injection performed. The injection can elevate your blood pressure and cause swelling of the extremities as well as other side effects. Please follow-up with Dr. Caryl Bis to monitor your general medical condition status post steroid injection as discussed  F/U surgical evaluation. He patient scheduled to see neurosurgeon Dr. Saintclair Halsted  . Recommend patient consider seeing  Dr. Marry Guan as well   F/U neurological evaluation as discussed  Patient will continue follow-up evaluations with Dr. [name]  F/U Dr. [name] as needed and as discussed  F/U rheumatological evaluation as discussed  May consider radiofrequency rhizolysis or intraspinal procedures pending response to present treatment and F/U evaluation. We will avoid such procedures at this time  Please call pain management for any concerns you may have prior to scheduled return appointmentPain Management Discharge Instructions  General Discharge Instructions :  If you need to reach your doctor call: Monday-Friday 8:00 am - 4:00 pm at [phone] or toll free [phone].  After clinic hours [phone] to have operator reach doctor.  Bring all of your medication bottles to all your appointments in the pain clinic.  To cancel or reschedule your appointment with Pain Management please remember to call 24 hours in advance to avoid a fee.  Refer to the educational materials which you have been given on: General Risks, I had my Procedure. Discharge Instructions, Post Sedation.  Post Procedure Instructions:  The drugs you were given will stay in your system until tomorrow, so for the next 24 hours you should not drive, make any legal decisions or drink any alcoholic beverages.  You may eat anything you prefer, but it is better to start with liquids  then soups and crackers, and gradually work up to solid foods.  Please notify your doctor immediately if you have any unusual bleeding, trouble breathing or pain that is not related to your normal pain.  Depending on the type of procedure that was done, some parts of your body may feel week and/or numb.  This usually clears up by tonight or the next day.  Walk with the use of an assistive device or accompanied by an adult for the 24 hours.  You may use ice on the affected area for the first 24 hours.  Put ice in a Ziploc bag and cover with a towel and place against area 15 minutes on 15 minutes off.  You may switch to heat after 24 hours.Epidural Steroid Injection An epidural steroid injection is given to relieve pain in your neck, back, or legs that is caused by the irritation or swelling of a nerve root. This procedure involves injecting a steroid and numbing medicine (anesthetic) into the epidural space. The epidural space is the space between the outer covering of your spinal cord and the bones that form your backbone (vertebra).  LET [facility] CARE PROVIDER KNOW ABOUT:   Any allergies you have.  All medicines you are taking, including vitamins, herbs, eye drops, creams, and over-the-counter medicines such as aspirin.  Previous problems you or members of your family have had with the use of anesthetics.  Any blood disorders or blood clotting disorders you have.  Previous surgeries you have had.  Medical conditions you have. RISKS AND COMPLICATIONS Generally, this is a safe procedure. However, as with any procedure, complications can occur. Possible complications of  epidural steroid injection include:  Headache.  Bleeding.  Infection.  Allergic reaction to the medicines.  Damage to your nerves. The response to this procedure depends on the underlying cause of the pain and its duration. People who have long-term (chronic) pain are less likely to benefit from epidural steroids  than are those people whose pain comes on strong and suddenly. BEFORE THE PROCEDURE   Ask your health care provider about changing or stopping your regular medicines. You may be advised to stop taking blood-thinning medicines a few days before the procedure.  You may be given medicines to reduce anxiety.  Arrange for someone to take you home after the procedure. PROCEDURE   You will remain awake during the procedure. You may receive medicine to make you relaxed.  You will be asked to lie on your stomach.  The injection site will be cleaned.  The injection site will be numbed with a medicine (local anesthetic).  A needle will be injected through your skin into the epidural space.  Your health care provider will use an X-ray machine to ensure that the steroid is delivered closest to the affected nerve. You may have minimal discomfort at this time.  Once the needle is in the right position, the local anesthetic and the steroid will be injected into the epidural space.  The needle will then be removed and a bandage will be applied to the injection site. AFTER THE PROCEDURE   You may be monitored for a short time before you go home.  You may feel weakness or numbness in your arm or leg, which disappears within hours.  You may be allowed to eat, drink, and take your regular medicine.  You may have soreness at the site of the injection.   This information is not intended to replace advice given to you by your health care provider. Make sure you discuss any questions you have with your health care provider.   Document Released: 08/17/2007 Document Revised: 01/10/2013 Document Reviewed: 10/27/2012 Elsevier Interactive Patient Education 2016 [place]  What are the risk, side effects and possible complications? Generally speaking, most procedures are safe.  However, with any procedure there are risks, side effects, and the possibility of  complications.  The risks and complications are dependent upon the sites that are lesioned, or the type of nerve block to be performed.  The closer the procedure is to the spine, the more serious the risks are.  Great care is taken when placing the radio frequency needles, block needles or lesioning probes, but sometimes complications can occur. 1. Infection: Any time there is an injection through the skin, there is a risk of infection.  This is why sterile conditions are used for these blocks.  There are four possible types of infection. 1. Localized skin infection. 2. Central Nervous System Infection-This can be in the form of Meningitis, which can be deadly. 3. Epidural Infections-This can be in the form of an epidural abscess, which can cause pressure inside of the spine, causing compression of the spinal cord with subsequent paralysis. This would require an emergency surgery to decompress, and there are no guarantees that the patient would recover from the paralysis. 4. Discitis-This is an infection of the intervertebral discs.  It occurs in about 1% of discography procedures.  It is difficult to treat and it may lead to surgery.        2. Pain: the needles have to go through skin and soft tissues, will  cause soreness.       3. Damage to internal structures:  The nerves to be lesioned may be near blood vessels or    other nerves which can be potentially damaged.       4. Bleeding: Bleeding is more common if the patient is taking blood thinners such as  aspirin, Coumadin, Ticiid, Plavix, etc., or if he/she have some genetic predisposition  such as hemophilia. Bleeding into the spinal canal can cause compression of the spinal  cord with subsequent paralysis.  This would require an emergency surgery to  decompress and there are no guarantees that the patient would recover from the  paralysis.       5. Pneumothorax:  Puncturing of a lung is a possibility, every time a needle is introduced in  the area of  the chest or upper back.  Pneumothorax refers to free air around the  collapsed lung(s), inside of the thoracic cavity (chest cavity).  Another two possible  complications related to a similar event would include: Hemothorax and Chylothorax.   These are variations of the Pneumothorax, where instead of air around the collapsed  lung(s), you may have blood or chyle, respectively.       6. Spinal headaches: They may occur with any procedures in the area of the spine.       7. Persistent CSF (Cerebro-Spinal Fluid) leakage: This is a rare problem, but may occur  with prolonged intrathecal or epidural catheters either due to the formation of a fistulous  track or a dural tear.       8. Nerve damage: By working so close to the spinal cord, there is always a possibility of  nerve damage, which could be as serious as a permanent spinal cord injury with  paralysis.       9. Death:  Although rare, severe deadly allergic reactions known as "Anaphylactic  reaction" can occur to any of the medications used.      10. Worsening of the symptoms:  We can always make thing worse.  What are the chances of something like this happening? Chances of any of this occuring are extremely low.  By statistics, you have more of a chance of getting killed in a motor vehicle accident: while driving to the hospital than any of the above occurring .  Nevertheless, you should be aware that they are possibilities.  In general, it is similar to taking a shower.  Everybody knows that you can slip, hit your head and get killed.  Does that mean that you should not shower again?  Nevertheless always keep in mind that statistics do not mean anything if you happen to be on the wrong side of them.  Even if a procedure has a 1 (one) in a 1,000,000 (million) chance of going wrong, it you happen to be that one..Also, keep in mind that by statistics, you have more of a chance of having something go wrong when taking medications.  Who should not have  this procedure? If you are on a blood thinning medication (e.g. Coumadin, Plavix, see list of "Blood Thinners"), or if you have an active infection going on, you should not have the procedure.  If you are taking any blood thinners, please inform your physician.  How should I prepare for this procedure?  Do not eat or drink anything at least six hours prior to the procedure.  Bring a driver with you .  It cannot be a taxi.  Come accompanied by  an adult that can drive you back, and that is strong enough to help you if your legs get weak or numb from the local anesthetic.  Take all of your medicines the morning of the procedure with just enough water to swallow them.  If you have diabetes, make sure that you are scheduled to have your procedure done first thing in the morning, whenever possible.  If you have diabetes, take only half of your insulin dose and notify our nurse that you have done so as soon as you arrive at the clinic.  If you are diabetic, but only take blood sugar pills (oral hypoglycemic), then do not take them on the morning of your procedure.  You may take them after you have had the procedure.  Do not take aspirin or any aspirin-containing medications, at least eleven (11) days prior to the procedure.  They may prolong bleeding.  Wear loose fitting clothing that may be easy to take off and that you would not mind if it got stained with Betadine or blood.  Do not wear any jewelry or perfume  Remove any nail coloring.  It will interfere with some of our monitoring equipment.  NOTE: Remember that this is not meant to be interpreted as a complete list of all possible complications.  Unforeseen problems may occur.  BLOOD THINNERS The following drugs contain aspirin or other products, which can cause increased bleeding during surgery and should not be taken for 2 weeks prior to and 1 week after surgery.  If you should need take something for relief of minor pain, you may take  acetaminophen which is found in Tylenol,m Datril, Anacin-3 and Panadol. It is not blood thinner. The products listed below are.  Do not take any of the products listed below in addition to any listed on your instruction sheet.  A.P.C or A.P.C with Codeine Codeine Phosphate Capsules #3 Ibuprofen Ridaura  ABC compound Congesprin Imuran rimadil  Advil Cope Indocin Robaxisal  Alka-Seltzer Effervescent Pain Reliever and Antacid Coricidin or Coricidin-D  Indomethacin Rufen  Alka-Seltzer plus Cold Medicine Cosprin Ketoprofen S-A-C Tablets  Anacin Analgesic Tablets or Capsules Coumadin Korlgesic Salflex  Anacin Extra Strength Analgesic tablets or capsules CP-2 Tablets Lanoril Salicylate  Anaprox Cuprimine Capsules Levenox Salocol  Anexsia-D Dalteparin Magan Salsalate  Anodynos Darvon compound Magnesium Salicylate Sine-off  Ansaid Dasin Capsules Magsal Sodium Salicylate  Anturane Depen Capsules Marnal Soma  APF Arthritis pain formula Dewitt's Pills Measurin Stanback  Argesic Dia-Gesic Meclofenamic Sulfinpyrazone  Arthritis Bayer Timed Release Aspirin Diclofenac Meclomen Sulindac  Arthritis pain formula Anacin Dicumarol Medipren Supac  Analgesic (Safety coated) Arthralgen Diffunasal Mefanamic Suprofen  Arthritis Strength Bufferin Dihydrocodeine Mepro Compound Suprol  Arthropan liquid Dopirydamole Methcarbomol with Aspirin Synalgos  ASA tablets/Enseals Disalcid Micrainin Tagament  Ascriptin Doan's Midol Talwin  Ascriptin A/D Dolene Mobidin Tanderil  Ascriptin Extra Strength Dolobid Moblgesic Ticlid  Ascriptin with Codeine Doloprin or Doloprin with Codeine Momentum Tolectin  Asperbuf Duoprin Mono-gesic Trendar  Aspergum Duradyne Motrin or Motrin IB Triminicin  Aspirin plain, buffered or enteric coated Durasal Myochrisine Trigesic  Aspirin Suppositories Easprin Nalfon Trillsate  Aspirin with Codeine Ecotrin Regular or Extra Strength Naprosyn Uracel  Atromid-S Efficin Naproxen Ursinus  Auranofin  Capsules Elmiron Neocylate Vanquish  Axotal Emagrin Norgesic Verin  Azathioprine Empirin or Empirin with Codeine Normiflo Vitamin E  Azolid Emprazil Nuprin Voltaren  Bayer Aspirin plain, buffered or children's or timed BC Tablets or powders Encaprin Orgaran Warfarin Sodium  Buff-a-Comp Enoxaparin Orudis Zorpin  Buff-a-Comp with Codeine  Equegesic Os-Cal-Gesic   Buffaprin Excedrin plain, buffered or Extra Strength Oxalid   Bufferin Arthritis Strength Feldene Oxphenbutazone   Bufferin plain or Extra Strength Feldene Capsules Oxycodone with Aspirin   Bufferin with Codeine Fenoprofen Fenoprofen Pabalate or Pabalate-SF   Buffets II Flogesic Panagesic   Buffinol plain or Extra Strength Florinal or Florinal with Codeine Panwarfarin   Buf-Tabs Flurbiprofen Penicillamine   Butalbital Compound Four-way cold tablets Penicillin   Butazolidin Fragmin Pepto-Bismol   Carbenicillin Geminisyn Percodan   Carna Arthritis Reliever Geopen Persantine   Carprofen Gold's salt Persistin   Chloramphenicol Goody's Phenylbutazone   Chloromycetin Haltrain Piroxlcam   Clmetidine heparin Plaquenil   Cllnoril Hyco-pap Ponstel   Clofibrate Hydroxy chloroquine Propoxyphen         Before stopping any of these medications, be sure to consult the physician who ordered them.  Some, such as Coumadin (Warfarin) are ordered to prevent or treat serious conditions such as "deep thrombosis", "pumonary embolisms", and other heart problems.  The amount of time that you may need off of the medication may also vary with the medication and the reason for which you were taking it.  If you are taking any of these medications, please make sure you notify your pain physician before you undergo any procedures.

## 2015-12-29 ENCOUNTER — Telehealth: Payer: Self-pay

## 2015-12-29 NOTE — Telephone Encounter (Signed)
Patient is inquiring about getting approval for MRI or CT scan Dr. Primus Bravo ordered. He is in a lot of pain.

## 2016-01-02 ENCOUNTER — Telehealth: Payer: Self-pay | Admitting: Pain Medicine

## 2016-01-02 ENCOUNTER — Ambulatory Visit
Admission: RE | Admit: 2016-01-02 | Discharge: 2016-01-02 | Disposition: A | Discharge status: Home / Self Care | Payer: Medicare Other | Source: Ambulatory Visit | Attending: Pain Medicine | Admitting: Pain Medicine

## 2016-01-02 ENCOUNTER — Other Ambulatory Visit: Payer: Self-pay | Admitting: Pain Medicine

## 2016-01-02 DIAGNOSIS — M5030 Other cervical disc degeneration, unspecified cervical region: Secondary | ICD-10-CM | POA: Insufficient documentation

## 2016-01-02 DIAGNOSIS — M7062 Trochanteric bursitis, left hip: Secondary | ICD-10-CM | POA: Insufficient documentation

## 2016-01-02 DIAGNOSIS — M7060 Trochanteric bursitis, unspecified hip: Secondary | ICD-10-CM | POA: Diagnosis not present

## 2016-01-02 DIAGNOSIS — M533 Sacrococcygeal disorders, not elsewhere classified: Secondary | ICD-10-CM

## 2016-01-02 DIAGNOSIS — M160 Bilateral primary osteoarthritis of hip: Secondary | ICD-10-CM | POA: Diagnosis not present

## 2016-01-02 DIAGNOSIS — M4806 Spinal stenosis, lumbar region: Secondary | ICD-10-CM | POA: Diagnosis not present

## 2016-01-02 DIAGNOSIS — M47812 Spondylosis without myelopathy or radiculopathy, cervical region: Secondary | ICD-10-CM

## 2016-01-02 DIAGNOSIS — M5136 Other intervertebral disc degeneration, lumbar region: Secondary | ICD-10-CM | POA: Insufficient documentation

## 2016-01-02 DIAGNOSIS — M7061 Trochanteric bursitis, right hip: Secondary | ICD-10-CM

## 2016-01-02 DIAGNOSIS — M47816 Spondylosis without myelopathy or radiculopathy, lumbar region: Secondary | ICD-10-CM

## 2016-01-02 DIAGNOSIS — M4726 Other spondylosis with radiculopathy, lumbar region: Secondary | ICD-10-CM | POA: Diagnosis not present

## 2016-01-02 DIAGNOSIS — T457X1A Poisoning by anticoagulant antagonists, vitamin K and other coagulants, accidental (unintentional), initial encounter: Principal | ICD-10-CM

## 2016-01-02 DIAGNOSIS — M5416 Radiculopathy, lumbar region: Secondary | ICD-10-CM | POA: Insufficient documentation

## 2016-01-02 DIAGNOSIS — D689 Coagulation defect, unspecified: Secondary | ICD-10-CM

## 2016-01-02 NOTE — Telephone Encounter (Signed)
Spoke with Dr Primus Bravo.  He states that we can schedule patient for Epidural on 01-12-16.  Must stop Aspirin for 5 days.  Bryan [name] states she will notify patient.

## 2016-01-02 NOTE — Telephone Encounter (Signed)
Patient has had MRI and gone off ASA on 01-01-16, wants to know if he can have procedure on Wed 01-07-16

## 2016-01-02 NOTE — Telephone Encounter (Signed)
error 

## 2016-01-05 ENCOUNTER — Telehealth: Payer: Self-pay | Admitting: Family Medicine

## 2016-01-05 DIAGNOSIS — I10 Essential (primary) hypertension: Secondary | ICD-10-CM | POA: Diagnosis not present

## 2016-01-05 DIAGNOSIS — L97511 Non-pressure chronic ulcer of other part of right foot limited to breakdown of skin: Secondary | ICD-10-CM | POA: Diagnosis not present

## 2016-01-05 DIAGNOSIS — N183 Chronic kidney disease, stage 3 (moderate): Secondary | ICD-10-CM | POA: Diagnosis not present

## 2016-01-05 DIAGNOSIS — Z01812 Encounter for preprocedural laboratory examination: Secondary | ICD-10-CM

## 2016-01-05 DIAGNOSIS — B351 Tinea unguium: Secondary | ICD-10-CM | POA: Diagnosis not present

## 2016-01-05 DIAGNOSIS — I272 Other secondary pulmonary hypertension: Secondary | ICD-10-CM | POA: Diagnosis not present

## 2016-01-05 NOTE — Telephone Encounter (Signed)
Mr. Drennon called saying he has an appointment with his Pain Management Provider, Dr. Mohammed Kindle on August 21st. He needs medical clearance from Dr. Caryl Bis to receive epidural. In addition, he needs a clotting lab test and Mr. Rosenbauer is wondering how he can receive this. Please give him a phone call if needed.  Pt's ph# Morning: [phone]   / After 1pm: [phone] Thank you.

## 2016-01-05 NOTE — Progress Notes (Signed)
Patient contacted, states Dr. Primus Bravo has already informed him of this last week.

## 2016-01-05 NOTE — Telephone Encounter (Signed)
Patient stated that he will be at [phone] today

## 2016-01-05 NOTE — Telephone Encounter (Signed)
LM for patient to return call.

## 2016-01-05 NOTE — Telephone Encounter (Signed)
I will need documentation from Dr Ethel Rana office requesting clearance. I can order an INR for the patient if needed.

## 2016-01-05 NOTE — Telephone Encounter (Signed)
Can you order this lab and give clearance

## 2016-01-06 NOTE — Telephone Encounter (Signed)
Pt would like to get his INR checked. Need order please and thank you! Pt is calling back returning your call @ [phone] or home [phone].

## 2016-01-07 ENCOUNTER — Telehealth: Payer: Self-pay | Admitting: Family Medicine

## 2016-01-07 ENCOUNTER — Telehealth: Payer: Self-pay

## 2016-01-07 ENCOUNTER — Other Ambulatory Visit: Payer: Self-pay | Admitting: Pain Medicine

## 2016-01-07 NOTE — Telephone Encounter (Signed)
Patient said that Dr. Primus Bravo was going to order the INR and does not need clearance for the procedure.

## 2016-01-07 NOTE — Telephone Encounter (Signed)
Nurses, Liana Crocker and Jocelyn Lamer  The patient has difficulty hearing. I told the patient that we need to have PT, PTT, and INR drawn on the day of his planned lumbar epidural steroid injection.. The patient should go to the lab early that morning before he comes to the clinic for his lumbar epidural steroid injection   The patient misunderstood me when I stated PT PTT and INR. He thought I said PCP.  Please call the patient and clarify this with him   Thank you

## 2016-01-07 NOTE — Telephone Encounter (Signed)
Please let the patient know that I can place the order though there may be a $10 charge for this as medicare does not appear to cover this lab test for preprocedure testing. If he is willing to accept responsibility for the $10 I can place this order for him.

## 2016-01-07 NOTE — Telephone Encounter (Signed)
Spoke with patient and he will call Dr. Primus Bravo office for them to fax medical clearance form to our office. I have scheduled him for Friday to have INR drawn. Please place order.

## 2016-01-07 NOTE — Telephone Encounter (Signed)
Pt said Dr. Primus Bravo wanted him to go see his PCP for blood work. Patient is saying pcp needs a request from Dr. Primus Bravo to complete this. This will not happen with out a request from Dr. Primus Bravo it could be faxed over.

## 2016-01-07 NOTE — Telephone Encounter (Signed)
Patient advised of Dr. Ethel Rana reply. Instructed to go to labs on morning of procedure.

## 2016-01-07 NOTE — Telephone Encounter (Signed)
Bryan [name] called saying he misunderstood his Pain Management doctor and he's actually supposed to have his lab work performed at the [facility] at [facility] instead of here. He said [name], asked for INR information from his Pain Management doctor but he actually needed to cancel the lab appointment here. Please give him a phone call if needed.  Pt's ph# [phone] Thank you.

## 2016-01-07 NOTE — Telephone Encounter (Signed)
Noted  

## 2016-01-07 NOTE — Telephone Encounter (Signed)
Spoke with patient.  Please see previous note.

## 2016-01-07 NOTE — Telephone Encounter (Signed)
Dr. Primus Bravo, please clarify.

## 2016-01-08 ENCOUNTER — Ambulatory Visit: Payer: Medicare Other

## 2016-01-08 ENCOUNTER — Telehealth: Payer: Self-pay | Admitting: Pain Medicine

## 2016-01-08 NOTE — Telephone Encounter (Signed)
Wants to speak with nurse or Dr. Primus Bravo

## 2016-01-09 ENCOUNTER — Other Ambulatory Visit: Payer: Medicare Other

## 2016-01-09 ENCOUNTER — Telehealth: Payer: Self-pay | Admitting: Pain Medicine

## 2016-01-09 NOTE — Telephone Encounter (Signed)
Wanted to clarify that he does not need to see PCP before he gets a procedure.

## 2016-01-09 NOTE — Telephone Encounter (Signed)
Wants to speak with Dr. Primus Bravo regarding severe pain

## 2016-01-09 NOTE — Telephone Encounter (Signed)
Patient states he cannot make contact with neursurgeon. Dr. Primus Bravo notified, he spoke with patient about this.

## 2016-01-12 ENCOUNTER — Ambulatory Visit: Payer: Medicare Other | Admitting: Pain Medicine

## 2016-01-12 ENCOUNTER — Other Ambulatory Visit: Payer: Self-pay | Admitting: Pain Medicine

## 2016-01-13 ENCOUNTER — Telehealth: Payer: Self-pay

## 2016-01-13 DIAGNOSIS — I739 Peripheral vascular disease, unspecified: Secondary | ICD-10-CM | POA: Insufficient documentation

## 2016-01-13 DIAGNOSIS — M5137 Other intervertebral disc degeneration, lumbosacral region: Secondary | ICD-10-CM | POA: Diagnosis not present

## 2016-01-13 NOTE — Telephone Encounter (Signed)
Pt went to see the neurosurgeon and he suggested pt have epidural

## 2016-01-14 ENCOUNTER — Other Ambulatory Visit: Payer: Self-pay | Admitting: Pain Medicine

## 2016-01-14 DIAGNOSIS — M4726 Other spondylosis with radiculopathy, lumbar region: Secondary | ICD-10-CM

## 2016-01-14 DIAGNOSIS — M48062 Spinal stenosis, lumbar region with neurogenic claudication: Secondary | ICD-10-CM

## 2016-01-14 DIAGNOSIS — Z7901 Long term (current) use of anticoagulants: Secondary | ICD-10-CM

## 2016-01-14 DIAGNOSIS — M5415 Radiculopathy, thoracolumbar region: Secondary | ICD-10-CM

## 2016-01-14 DIAGNOSIS — M47812 Spondylosis without myelopathy or radiculopathy, cervical region: Secondary | ICD-10-CM

## 2016-01-14 NOTE — Telephone Encounter (Signed)
Called back to Bryan Voce, after speaking with Dr Primus Bravo will put Bryan [name] on for epidural for Monday or Wednesday and he will need PT, Ptt and Inr drawn prior to procedure d/t taking Plaquenil.  Pre procedure instructions given and patient verbalizes u/o information.

## 2016-01-14 NOTE — Telephone Encounter (Signed)
Pt has called again and wants someone to return his phone call pt wants to know when someone will call him back I did explain to pt the nurses were very busy yesterday because we had several doctors.

## 2016-01-16 DIAGNOSIS — M79609 Pain in unspecified limb: Secondary | ICD-10-CM | POA: Diagnosis not present

## 2016-01-19 ENCOUNTER — Other Ambulatory Visit
Admission: RE | Admit: 2016-01-19 | Discharge: 2016-01-19 | Disposition: A | Discharge status: Home / Self Care | Payer: Medicare Other | Source: Ambulatory Visit | Attending: Pain Medicine | Admitting: Pain Medicine

## 2016-01-19 ENCOUNTER — Encounter: Payer: Self-pay | Admitting: Pain Medicine

## 2016-01-19 ENCOUNTER — Ambulatory Visit: Payer: Medicare Other | Attending: Pain Medicine | Admitting: Pain Medicine

## 2016-01-19 VITALS — BP 164/70 | HR 49 | Temp 97.8°F | Resp 13 | Ht 72.0 in | Wt 198.0 lb

## 2016-01-19 DIAGNOSIS — M545 Low back pain: Secondary | ICD-10-CM | POA: Insufficient documentation

## 2016-01-19 DIAGNOSIS — M5137 Other intervertebral disc degeneration, lumbosacral region: Secondary | ICD-10-CM | POA: Diagnosis not present

## 2016-01-19 DIAGNOSIS — M5416 Radiculopathy, lumbar region: Secondary | ICD-10-CM

## 2016-01-19 DIAGNOSIS — M48062 Spinal stenosis, lumbar region with neurogenic claudication: Secondary | ICD-10-CM

## 2016-01-19 DIAGNOSIS — D689 Coagulation defect, unspecified: Secondary | ICD-10-CM | POA: Diagnosis not present

## 2016-01-19 DIAGNOSIS — M2578 Osteophyte, vertebrae: Secondary | ICD-10-CM | POA: Diagnosis not present

## 2016-01-19 DIAGNOSIS — M4807 Spinal stenosis, lumbosacral region: Secondary | ICD-10-CM | POA: Insufficient documentation

## 2016-01-19 DIAGNOSIS — M4806 Spinal stenosis, lumbar region: Secondary | ICD-10-CM | POA: Insufficient documentation

## 2016-01-19 DIAGNOSIS — M79606 Pain in leg, unspecified: Secondary | ICD-10-CM | POA: Diagnosis present

## 2016-01-19 DIAGNOSIS — M5136 Other intervertebral disc degeneration, lumbar region: Secondary | ICD-10-CM | POA: Diagnosis not present

## 2016-01-19 DIAGNOSIS — N281 Cyst of kidney, acquired: Secondary | ICD-10-CM | POA: Diagnosis not present

## 2016-01-19 DIAGNOSIS — T457X1A Poisoning by anticoagulant antagonists, vitamin K and other coagulants, accidental (unintentional), initial encounter: Secondary | ICD-10-CM | POA: Diagnosis not present

## 2016-01-19 DIAGNOSIS — M47816 Spondylosis without myelopathy or radiculopathy, lumbar region: Secondary | ICD-10-CM

## 2016-01-19 LAB — PROTIME-INR
INR: 1
Prothrombin Time: 13.2 seconds (ref 11.4–15.2)

## 2016-01-19 LAB — APTT: aPTT: 31 seconds (ref 24–36)

## 2016-01-19 MED ORDER — CIPROFLOXACIN IN D5W 400 MG/200ML IV SOLN
400.0000 mg | Freq: Once | INTRAVENOUS | Status: AC
  Administered 2016-01-19: 400 mg via INTRAVENOUS
  Filled 2016-01-19: qty 200

## 2016-01-19 MED ORDER — ORPHENADRINE CITRATE 30 MG/ML IJ SOLN
60.0000 mg | Freq: Once | INTRAMUSCULAR | Status: AC
  Administered 2016-01-19: 60 mg via INTRAMUSCULAR
  Filled 2016-01-19: qty 2

## 2016-01-19 MED ORDER — SODIUM CHLORIDE 0.9% FLUSH
20.0000 mL | Freq: Once | INTRAVENOUS | Status: AC
  Administered 2016-01-19: 20 mL

## 2016-01-19 MED ORDER — FENTANYL CITRATE (PF) 100 MCG/2ML IJ SOLN: 100.0000 ug | Freq: Once | INTRAMUSCULAR | Status: DC

## 2016-01-19 MED ORDER — CIPROFLOXACIN HCL 250 MG PO TABS: 250.0000 mg | ORAL_TABLET | Freq: Two times a day (BID) | ORAL | 0 refills | Status: DC

## 2016-01-19 MED ORDER — MIDAZOLAM HCL 5 MG/5ML IJ SOLN: 5.0000 mg | Freq: Once | INTRAMUSCULAR | Status: DC

## 2016-01-19 MED ORDER — LIDOCAINE HCL (PF) 1 % IJ SOLN
10.0000 mL | Freq: Once | INTRAMUSCULAR | Status: AC
Start: 2016-01-19 — End: 2016-01-19
  Administered 2016-01-19: 10 mL via SUBCUTANEOUS
  Filled 2016-01-19: qty 10

## 2016-01-19 MED ORDER — BUPIVACAINE HCL (PF) 0.25 % IJ SOLN
30.0000 mL | Freq: Once | INTRAMUSCULAR | Status: AC
  Administered 2016-01-19: 30 mL
  Filled 2016-01-19: qty 30

## 2016-01-19 MED ORDER — LIDOCAINE HCL (PF) 1 % IJ SOLN
INTRAMUSCULAR | Status: AC
  Filled 2016-01-19: qty 5

## 2016-01-19 MED ORDER — TRIAMCINOLONE ACETONIDE 40 MG/ML IJ SUSP
40.0000 mg | Freq: Once | INTRAMUSCULAR | Status: AC
  Administered 2016-01-19: 40 mg
  Filled 2016-01-19: qty 1

## 2016-01-19 MED ORDER — LACTATED RINGERS IV SOLN
1000.0000 mL | INTRAVENOUS | Status: DC
  Administered 2016-01-19: 1000 mL via INTRAVENOUS

## 2016-01-19 NOTE — Patient Instructions (Addendum)
PLAN   Continue present  medication Neurontin  hydrocodone acetaminophen and  fentanyl patch   F/U PCP  Dr. Caryl Bis for evaliation of  BP and general medical  condition status post steroid injection performed. The injection can elevate your blood pressure and cause swelling of the extremities as well as other side effects. Please follow-up with Dr. Caryl Bis to monitor your general medical condition status post steroid injection as discussed  F/U surgical evaluation. Patient will undergo follow-up neurosurgical evaluation as discussed . Recommend patient consider seeing  Dr. Marry Guan as well   F/U neurological evaluation as discussed  Patient will continue follow-up evaluations with Dr. [name]  F/U Dr. [name] as needed and as discussed  F/U rheumatological evaluation as discussed  May consider radiofrequency rhizolysis or intraspinal procedures pending response to present treatment and F/U evaluation. We will avoid such procedures at this time  Please call pain management for any concerns you may have prior to scheduled return appointmentPain Management Discharge Instructions  General Discharge Instructions :  If you need to reach your doctor call: Monday-Friday 8:00 am - 4:00 pm at [phone] or toll free [phone].  After clinic hours [phone] to have operator reach doctor.  Bring all of your medication bottles to all your appointments in the pain clinic.  To cancel or reschedule your appointment with Pain Management please remember to call 24 hours in advance to avoid a fee.  Refer to the educational materials which you have been given on: General Risks, I had my Procedure. Discharge Instructions, Post Sedation.  Post Procedure Instructions:  The drugs you were given will stay in your system until tomorrow, so for the next 24 hours you should not drive, make any legal decisions or drink any alcoholic beverages.  You may eat anything you prefer, but it is better to  start with liquids then soups and crackers, and gradually work up to solid foods.  Please notify your doctor immediately if you have any unusual bleeding, trouble breathing or pain that is not related to your normal pain.  Depending on the type of procedure that was done, some parts of your body may feel week and/or numb.  This usually clears up by tonight or the next day.  Walk with the use of an assistive device or accompanied by an adult for the 24 hours.  You may use ice on the affected area for the first 24 hours.  Put ice in a Ziploc bag and cover with a towel and place against area 15 minutes on 15 minutes off.  You may switch to heat after 24 hours.

## 2016-01-19 NOTE — Progress Notes (Signed)
PROCEDURE PERFORMED: Lumbar epidural steroid injection   NOTE: The patient is a 75 y.o. male who returns to [facility] for further evaluation and treatment of pain involving the lumbar and lower extremity region. MRI revealed the patient to be with Segmentation:  Normal.  Alignment:  Normal.  Vertebrae:  No significant abnormality.  Conus medullaris: Extends to the L2 level and appears normal.  Paraspinal and other soft tissues: Benign-appearing 19 mm cyst on the upper pole of right kidney. Chronic hemorrhagic cyst on the upper pole of the left kidney measuring 14 mm.  Disc levels:  T12-L1 through L2-3: Disc desiccation with small broad-based disc bulges at each level with no neural impingement, unchanged.  L3-4: Disc desiccation with chronic disc space narrowing with a small broad-based disc bulge extending foraminal and far lateral to the right and left without neural impingement, unchanged.  L4-5: Disc desiccation. Small broad-based disc bulge with a small annular fissure adjacent to the left neural foramen, unchanged. Minimal compression of the thecal sac. Moderate right foraminal stenosis. However, the L4 nerve appears to exit without impingement, unchanged. Slight hypertrophy of the ligamentum flavum and facet joints, unchanged.  L5-S1: Disc desiccation with chronic disc space narrowing asymmetric to the right with a small chronic broad-based disc protrusion at with disc osteophyte complex extending into the right neural foramen markedly compressing the right L5 nerve, unchanged. Slight right facet arthritis.  IMPRESSION: 1. Severe right foraminal stenosis at L5-S1 which should affect the right L5 nerve. The finding is unchanged. 2. Chronic diffuse degenerative disc disease without other areas of neural impingement, unchanged.  There is concern regarding intraspinal abnormalities contributing to patient's symptomatology with concern  regarding lumbar stenosis with neurogenic claudication and lumbar radiculopathy as well as lumbar facet syndrome. The risks, benefits, and expectations of the procedure have been discussed and explained to the patient who was understanding and in agreement with suggested treatment plan. We will proceed with lumbar epidural steroid injection as discussed and as explained to the patient who is willing to proceed with procedure as planned.   DESCRIPTION OF PROCEDURE: Lumbar epidural steroid injection with EKG, blood pressure, pulse, capnography, and pulse oximetry monitoring. The procedure was performed with the patient in the prone position under fluoroscopic guidance. A local anesthetic skin wheal of 1.5% plain lidocaine was accomplished at proposed entry site. An 18-gauge Tuohy epidural needle was inserted at the L 4 vertebral body level right of the midline via loss-of-resistance technique with negative heme and negative CSF return. A total of 4 mL of Preservative-Free normal saline with 40 mg of Kenalog injected incrementally via epidurally placed needle. Needle was removed.    A total of 40 mg of Kenalog was utilized for the procedure.   The patient tolerated the injection well.    PLAN:   1. Medications: We will continue presently prescribed medications. Neurontin hydrocodone acetaminophen and fentanyl patch 2. Will consider modification of treatment regimen pending response to treatment rendered on today's visit and follow-up evaluation. 3. The patient is to follow-up with primary care physician Dr. Caryl Bis regarding blood pressure and general medical condition status post lumbar epidural steroid injection performed on today's visit. 4. Surgical evaluation.. Patient will follow-up with Dr. Saintclair Halsted . Patient will also follow up with Dr. [name] 5. Neurological evaluation. May consider PN[facility] EMG studies and other studies. Patient will follow-up with rheumatologist as discussed 6.   The patient  may be a candidate for radiofrequency procedures, implantation device, and other treatment  pending response to treatment and follow-up evaluation.  The patient has been advised to adhere to proper body mechanics and avoid activities which appear to aggravate condition.  The patient has been advised to call the [facility] prior to scheduled return appointment should there be significant change in condition or should there be sign  The patient is understanding and agrees with the suggested  treatment plan

## 2016-01-20 ENCOUNTER — Telehealth: Payer: Self-pay | Admitting: *Deleted

## 2016-01-20 NOTE — Telephone Encounter (Signed)
Spoke with patient's wife, no problems post procedure. 

## 2016-01-23 DIAGNOSIS — Z6827 Body mass index (BMI) 27.0-27.9, adult: Secondary | ICD-10-CM | POA: Diagnosis not present

## 2016-01-23 DIAGNOSIS — M5126 Other intervertebral disc displacement, lumbar region: Secondary | ICD-10-CM | POA: Diagnosis not present

## 2016-01-23 DIAGNOSIS — I10 Essential (primary) hypertension: Secondary | ICD-10-CM | POA: Diagnosis not present

## 2016-01-23 DIAGNOSIS — M5137 Other intervertebral disc degeneration, lumbosacral region: Secondary | ICD-10-CM | POA: Diagnosis not present

## 2016-01-27 ENCOUNTER — Ambulatory Visit: Payer: Medicare Other | Admitting: Pain Medicine

## 2016-01-28 ENCOUNTER — Ambulatory Visit: Payer: Medicare Other | Attending: Pain Medicine | Admitting: Pain Medicine

## 2016-01-28 ENCOUNTER — Encounter: Payer: Self-pay | Admitting: Pain Medicine

## 2016-01-28 VITALS — BP 144/66 | HR 55 | Temp 98.1°F | Resp 16 | Ht 72.0 in | Wt 198.0 lb

## 2016-01-28 DIAGNOSIS — M5136 Other intervertebral disc degeneration, lumbar region: Secondary | ICD-10-CM | POA: Insufficient documentation

## 2016-01-28 DIAGNOSIS — M7060 Trochanteric bursitis, unspecified hip: Secondary | ICD-10-CM | POA: Diagnosis not present

## 2016-01-28 DIAGNOSIS — M47812 Spondylosis without myelopathy or radiculopathy, cervical region: Secondary | ICD-10-CM

## 2016-01-28 DIAGNOSIS — M79606 Pain in leg, unspecified: Secondary | ICD-10-CM | POA: Diagnosis present

## 2016-01-28 DIAGNOSIS — R413 Other amnesia: Secondary | ICD-10-CM | POA: Diagnosis not present

## 2016-01-28 DIAGNOSIS — M4806 Spinal stenosis, lumbar region: Secondary | ICD-10-CM | POA: Diagnosis not present

## 2016-01-28 DIAGNOSIS — M47817 Spondylosis without myelopathy or radiculopathy, lumbosacral region: Secondary | ICD-10-CM | POA: Diagnosis not present

## 2016-01-28 DIAGNOSIS — M4726 Other spondylosis with radiculopathy, lumbar region: Secondary | ICD-10-CM

## 2016-01-28 DIAGNOSIS — M47816 Spondylosis without myelopathy or radiculopathy, lumbar region: Secondary | ICD-10-CM

## 2016-01-28 DIAGNOSIS — M069 Rheumatoid arthritis, unspecified: Secondary | ICD-10-CM | POA: Diagnosis not present

## 2016-01-28 DIAGNOSIS — M791 Myalgia: Secondary | ICD-10-CM | POA: Diagnosis not present

## 2016-01-28 DIAGNOSIS — N289 Disorder of kidney and ureter, unspecified: Secondary | ICD-10-CM | POA: Diagnosis not present

## 2016-01-28 DIAGNOSIS — M533 Sacrococcygeal disorders, not elsewhere classified: Secondary | ICD-10-CM | POA: Diagnosis not present

## 2016-01-28 DIAGNOSIS — M170 Bilateral primary osteoarthritis of knee: Secondary | ICD-10-CM | POA: Diagnosis not present

## 2016-01-28 DIAGNOSIS — M5416 Radiculopathy, lumbar region: Secondary | ICD-10-CM | POA: Diagnosis not present

## 2016-01-28 DIAGNOSIS — M545 Low back pain: Secondary | ICD-10-CM | POA: Diagnosis present

## 2016-01-28 MED ORDER — GABAPENTIN 800 MG PO TABS
ORAL_TABLET | ORAL | 0 refills | Status: DC
Start: 2016-01-28 — End: 2017-01-31

## 2016-01-28 MED ORDER — HYDROCODONE-ACETAMINOPHEN 5-325 MG PO TABS: ORAL_TABLET | ORAL | 0 refills | Status: DC

## 2016-01-28 MED ORDER — FENTANYL 25 MCG/HR TD PT72: MEDICATED_PATCH | TRANSDERMAL | 0 refills | Status: DC

## 2016-01-28 NOTE — Progress Notes (Signed)
The patient is a 75 year old gentleman who returns to pain management for further evaluation and treatment of pain involving the lower back and lower extremity region. The patient is status post lumbar epidural steroid injection with some improvement of lower back lower extremity pain. The patient will follow-up with Dr. Saintclair Halsted of neurosurgical reevaluation as discussed and we will remain available to consider additional interventional treatment including lumbar epidural steroid injection as discussed. We reviewed patient's MRI findings on today's visit and we will consider patient for interventional treatment pending further assessment of patient's condition. Patient states pain is aggravated by standing and walking and becomes more intense as patient spends more time on the feet. We will remain available to consider modification of treatment regimen as discussed and patient will follow-up with Dr. Saintclair Halsted as discussed       Physical examination   There was tenderness to palpation of paraspinal muscular treat the cervical and cervical facet region of mild to moderate degree with mild to moderate tenderness of the splenius capitis and occipitalis regions. Palpation over the region of the cervical facet and thoracic facet regions reproduced pain of moderate degree. The patient appeared to be with unremarkable Spurling's maneuver. Palpation of the acromioclavicular and glenohumeral joint regions reproduce moderate discomfort. There was subluxation of the digits of the hand with no increased warmth and erythema of the hand noted. Grip strength appeared to be decreased. Tinel and Phalen's maneuver were without increased pain of severe degree. Palpation over the thoracic region was attends to palpation of moderate degree with moderate muscle spasms noted. Palpation over the lumbar paraspinal musculatures and lumbar facet region was with moderate tenderness to palpation with lateral bending rotation extension  and palpation of the lumbar facets reproducing moderate discomfort. There was tenderness to palpation of the PSIS and PII S region a moderate to severe degree. Straight leg raise was tolerates approximately 20 increased pain with dorsiflexion noted. There appeared to be negative clonus negative Homans. Abdomen nontender with no costovertebral tenderness noted.      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 A small papillary tumor could have a similar appearance.  Rheumatoid arthritis  Sacroiliac joint dysfunction      PLAN   Continue present  medication Neurontin  hydrocodone acetaminophen and  fentanyl patch   Lumbar epidural steroid injection to be performed at time return appointment  F/U PCP  Dr. Caryl Bis for evaliation of  BP and general medical  condition status post steroid injection performed. The injection can elevate your blood pressure and cause swelling of the extremities as well as other side effects. Please follow-up with Dr. Caryl Bis to monitor your general medical condition status post steroid injection as discussed  F/U surgical evaluation. . Patient will follow-up with Dr. Saintclair Halsted for neurosurgical reevaluation. Recommend patient consider seeing  Dr. Marry Guan as well   F/U neurological evaluation as discussed  Patient will continue follow-up evaluations with Dr. [name]  F/U Dr. [name] as needed and as discussed  F/U rheumatological evaluation as discussed  May consider  radiofrequency rhizolysis or intraspinal procedures pending response to present treatment  and F/U evaluation. We will avoid such procedures at this time  Please call pain management for any concerns you may have prior to scheduled return appointment

## 2016-01-28 NOTE — Patient Instructions (Addendum)
ccc

## 2016-01-28 NOTE — Progress Notes (Signed)
Patient here for medication management Safety precautions to be maintained throughout the outpatient stay will include: orient to surroundings, keep bed in low position, maintain call bell within reach at all times, provide assistance with transfer out of bed and ambulation.  

## 2016-02-04 ENCOUNTER — Other Ambulatory Visit: Payer: Self-pay | Admitting: Neurosurgery

## 2016-02-04 DIAGNOSIS — M5126 Other intervertebral disc displacement, lumbar region: Secondary | ICD-10-CM

## 2016-02-09 ENCOUNTER — Ambulatory Visit
Admission: RE | Admit: 2016-02-09 | Discharge: 2016-02-09 | Disposition: A | Discharge status: Home / Self Care | Payer: Medicare Other | Source: Ambulatory Visit | Attending: Neurosurgery | Admitting: Neurosurgery

## 2016-02-09 ENCOUNTER — Other Ambulatory Visit: Payer: Self-pay | Admitting: Neurosurgery

## 2016-02-09 DIAGNOSIS — M545 Low back pain: Secondary | ICD-10-CM | POA: Diagnosis not present

## 2016-02-09 DIAGNOSIS — M5126 Other intervertebral disc displacement, lumbar region: Secondary | ICD-10-CM

## 2016-02-09 MED ORDER — IOPAMIDOL (ISOVUE-M 200) INJECTION 41%
1.0000 mL | Freq: Once | INTRAMUSCULAR | Status: AC
  Administered 2016-02-09: 1 mL via EPIDURAL

## 2016-02-09 MED ORDER — METHYLPREDNISOLONE ACETATE 40 MG/ML INJ SUSP (RADIOLOG
120.0000 mg | Freq: Once | INTRAMUSCULAR | Status: AC
  Administered 2016-02-09: 120 mg via EPIDURAL

## 2016-02-09 NOTE — Discharge Instructions (Signed)

## 2016-02-16 ENCOUNTER — Other Ambulatory Visit: Payer: Self-pay | Admitting: *Deleted

## 2016-02-16 MED ORDER — HYDRALAZINE HCL 25 MG PO TABS: 25.0000 mg | ORAL_TABLET | Freq: Three times a day (TID) | ORAL | 3 refills | Status: DC

## 2016-02-23 ENCOUNTER — Ambulatory Visit (INDEPENDENT_AMBULATORY_CARE_PROVIDER_SITE_OTHER): Payer: Medicare Other | Admitting: Family Medicine

## 2016-02-23 ENCOUNTER — Encounter: Payer: Self-pay | Admitting: Family Medicine

## 2016-02-23 VITALS — BP 130/74 | HR 58 | Temp 98.0°F | Wt 196.4 lb

## 2016-02-23 DIAGNOSIS — K6289 Other specified diseases of anus and rectum: Secondary | ICD-10-CM

## 2016-02-23 DIAGNOSIS — I6523 Occlusion and stenosis of bilateral carotid arteries: Secondary | ICD-10-CM | POA: Diagnosis not present

## 2016-02-23 DIAGNOSIS — D693 Immune thrombocytopenic purpura: Secondary | ICD-10-CM | POA: Diagnosis not present

## 2016-02-23 DIAGNOSIS — I10 Essential (primary) hypertension: Secondary | ICD-10-CM

## 2016-02-23 DIAGNOSIS — Z23 Encounter for immunization: Secondary | ICD-10-CM

## 2016-02-23 DIAGNOSIS — M0579 Rheumatoid arthritis with rheumatoid factor of multiple sites without organ or systems involvement: Secondary | ICD-10-CM | POA: Diagnosis not present

## 2016-02-23 LAB — BASIC METABOLIC PANEL
BUN: 44 mg/dL — ABNORMAL HIGH (ref 6–23)
CO2: 27 mEq/L (ref 19–32)
Calcium: 9 mg/dL (ref 8.4–10.5)
Chloride: 102 mEq/L (ref 96–112)
Creatinine, Ser: 1.88 mg/dL — ABNORMAL HIGH (ref 0.40–1.50)
GFR: 37.1 mL/min — ABNORMAL LOW (ref >60.00)
Glucose, Bld: 76 mg/dL (ref 70–99)
Potassium: 5 mEq/L (ref 3.5–5.1)
Sodium: 135 mEq/L (ref 135–145)

## 2016-02-23 NOTE — Assessment & Plan Note (Addendum)
He will continue to follow with pain management and neurosurgery. Neurologically intact today. He sees neurosurgery tomorrow.

## 2016-02-23 NOTE — Assessment & Plan Note (Addendum)
At goal. Continue current medications. Check BMP today.

## 2016-02-23 NOTE — Assessment & Plan Note (Signed)
Continues to have intermittent diarrhea with this. Also intermittent constipation. Discussed Imodium for this when having diarrhea. We'll continue to monitor.

## 2016-02-23 NOTE — Patient Instructions (Addendum)
Nice to see you. We will refer you to surgery for your rectal nodule. You can take over-the-counter Imodium for your diarrhea related IBS. Please continue your current blood pressure medicines.

## 2016-02-23 NOTE — Progress Notes (Signed)
  [name], MD Phone: [phone]  Bryan [name] is a 75 y.o. male who presents today for follow-up.  Rectal nodule: Patient notes for the last year he's had a small bulge near his rectum. No pain. No itching. No bleeding. Has a hard time getting his stool clear due to this.  Chronic low back pain: He has undergone 2 epidurals recently with little benefit. They're talking about doing surgery for this issue. Reports flattened disc at L4-L5. Does have some sciatica right greater than left with this. No saddle anesthesia, loss of bowel or bladder function, or fevers. Currently taking chronic pain medications for this.  IBS: Patient notes he alternates from constipation and diarrhea. He was on Lomotil a long time ago though none recently. No abdominal pain. No blood in his stool. No mucus. Does get an urge to go have a bowel movement and sometimes can't quite make it before he has to go.  HYPERTENSION  Disease Monitoring  Home BP Monitoring states typically similar to today       Chest pain- no    Dyspnea- no Medications  Compliance-  taking lisinopril, HCTZ, hydralazine.  Edema- no  PMH: Former smoker   ROS see history of present illness  Objective  Physical Exam Vitals:   02/23/16 1050  BP: 130/74  Pulse: (!) 58  Temp: 98 F (36.7 C)    BP Readings from Last 3 Encounters:  02/23/16 130/74  02/09/16 (!) 164/71  01/28/16 (!) 144/66   Wt Readings from Last 3 Encounters:  02/23/16 196 lb 6 oz (89.1 kg)  01/28/16 198 lb (89.8 kg)  01/19/16 198 lb (89.8 kg)    Physical Exam  Constitutional: He is well-developed, well-nourished, and in no distress.  Cardiovascular: Normal rate, regular rhythm and normal heart sounds.   Pulmonary/Chest: Effort normal and breath sounds normal.  Abdominal: Soft. Bowel sounds are normal. He exhibits no distension. There is no tenderness. There is no rebound and no guarding.  Genitourinary:  Genitourinary Comments: Rectal  examination with small nodule at the 7-8 o'clock area that is palpable although not necessarily visible on gross examination, otherwise normal rectal tone and exam, normal prostate exam  Musculoskeletal: He exhibits no edema.  Neurological: He is alert. Gait normal.  5 out of 5 strength in bilateral quads, hamstrings, flexion, and dorsiflexion, sensation to light touch intact in bilateral lower extremities  Skin: Skin is warm and dry.     Assessment/Plan: Please see individual problem list.  IBS (irritable bowel syndrome) Continues to have intermittent diarrhea with this. Also intermittent constipation. Discussed Imodium for this when having diarrhea. We'll continue to monitor.  Rectal nodule Nodule noted on exam. We'll refer to general surgery for consideration of biopsy or removal.  Essential hypertension At goal. Continue current medications. Check BMP today.  DDD (degenerative disc disease), lumbar He will continue to follow with pain management and neurosurgery. Neurologically intact today. He sees neurosurgery tomorrow.   Orders Placed This Encounter  Procedures  . Flu vaccine HIGH DOSE PF  . Basic metabolic panel  . Ambulatory referral to General Surgery    Referral Priority:   Routine    Referral Type:   Surgical    Referral Reason:   Specialty Services Required    Requested Specialty:   General Surgery    Number of Visits Requested:   1    [name], MD [place]

## 2016-02-23 NOTE — Assessment & Plan Note (Signed)
Nodule noted on exam. We'll refer to general surgery for consideration of biopsy or removal.

## 2016-02-24 ENCOUNTER — Telehealth: Payer: Self-pay | Admitting: Cardiovascular Disease

## 2016-02-24 ENCOUNTER — Other Ambulatory Visit: Payer: Self-pay | Admitting: Neurosurgery

## 2016-02-24 DIAGNOSIS — M5416 Radiculopathy, lumbar region: Secondary | ICD-10-CM | POA: Diagnosis not present

## 2016-02-24 DIAGNOSIS — M48061 Spinal stenosis, lumbar region without neurogenic claudication: Secondary | ICD-10-CM | POA: Diagnosis not present

## 2016-02-24 NOTE — Telephone Encounter (Signed)
Received cardiac clearance request for pt to proceed w/ L5/S1 Lumbar Laminectomy under general anesthesia on 03/08/16 to be performed by Dr. Kary Kos.' Please route clearance to [facility] Neurosurgery @ [phone].

## 2016-02-25 ENCOUNTER — Encounter: Payer: Self-pay | Admitting: Family Medicine

## 2016-02-25 NOTE — Telephone Encounter (Signed)
Acceptable risk for surgery, no further testing needed Will need preoperative antibiotics for prosthetic Valve Ideally would like to stay on aspirin but if surgery not possible on aspirin, could hold this for minimal amount of time possible  TG

## 2016-02-26 NOTE — Telephone Encounter (Signed)
Routed to fax # provided. 

## 2016-03-02 ENCOUNTER — Other Ambulatory Visit: Payer: Self-pay | Admitting: Pain Medicine

## 2016-03-02 DIAGNOSIS — M791 Myalgia: Secondary | ICD-10-CM | POA: Diagnosis not present

## 2016-03-02 DIAGNOSIS — M47817 Spondylosis without myelopathy or radiculopathy, lumbosacral region: Secondary | ICD-10-CM | POA: Diagnosis not present

## 2016-03-02 DIAGNOSIS — M533 Sacrococcygeal disorders, not elsewhere classified: Secondary | ICD-10-CM | POA: Diagnosis not present

## 2016-03-02 DIAGNOSIS — M5416 Radiculopathy, lumbar region: Secondary | ICD-10-CM | POA: Diagnosis not present

## 2016-03-02 NOTE — Pre-Procedure Instructions (Signed)
    BASIR LEAHEY  03/02/2016      [facility] - [place], [place] - [address] [place] [address] Phone: [phone] Fax: [phone]    Your procedure is scheduled on Monday, March 08, 2016  Report to [facility] Admitting at 8:30 A.M.  Call this number if you have problems the morning of surgery:  [phone]   Remember:  Do not eat food or drink liquids after midnight Sunday, March 07, 2016  Take these medicines the morning of surgery with A SIP OF WATER : FLUoxetine (PROZAC), gabapentin (NEURONTIN), hydrALAZINE (APRESOLINE), hydroxychloroquine (PLAQUENIL), tamsulosin (FLOMAX),  cetirizine (ZYRTEC), eye drops, if needed: Hydrocodone, Prednisone Stop taking vitamins, fish oil and herbal medications. Do not take any NSAIDs ie: Ibuprofen, Advil, Naproxen, BC and Goody Powder; stop now.  Do not wear jewelry, make-up or nail polish.  Do not wear lotions, powders, or perfumes, or deoderant.  Do not shave 48 hours prior to surgery.  Men may shave face and neck.  Do not bring valuables to the hospital.  [facility] is not responsible for any belongings or valuables.  Contacts, dentures or bridgework may not be worn into surgery.  Leave your suitcase in the car.  After surgery it may be brought to your room. For patients admitted to the hospital, discharge time will be determined by your treatment team. Patients discharged the day of surgery will not be allowed to drive home.  Special instructions: Shower the night before surgery and the morning of surgery with CHG. Please read over the following fact sheets that you were given. Pain Booklet, Coughing and Deep Breathing, MRSA Information and Surgical Site Infection Prevention

## 2016-03-03 ENCOUNTER — Encounter (HOSPITAL_COMMUNITY): Payer: Self-pay

## 2016-03-03 ENCOUNTER — Encounter (HOSPITAL_COMMUNITY)
Admission: RE | Admit: 2016-03-03 | Discharge: 2016-03-03 | Disposition: A | Discharge status: Home / Self Care | Payer: Medicare Other | Source: Ambulatory Visit | Attending: Neurosurgery | Admitting: Neurosurgery

## 2016-03-03 DIAGNOSIS — I10 Essential (primary) hypertension: Secondary | ICD-10-CM | POA: Insufficient documentation

## 2016-03-03 DIAGNOSIS — I480 Paroxysmal atrial fibrillation: Secondary | ICD-10-CM | POA: Insufficient documentation

## 2016-03-03 DIAGNOSIS — M069 Rheumatoid arthritis, unspecified: Secondary | ICD-10-CM | POA: Insufficient documentation

## 2016-03-03 DIAGNOSIS — Z87891 Personal history of nicotine dependence: Secondary | ICD-10-CM | POA: Diagnosis not present

## 2016-03-03 DIAGNOSIS — E785 Hyperlipidemia, unspecified: Secondary | ICD-10-CM | POA: Insufficient documentation

## 2016-03-03 DIAGNOSIS — I2510 Atherosclerotic heart disease of native coronary artery without angina pectoris: Secondary | ICD-10-CM | POA: Diagnosis not present

## 2016-03-03 DIAGNOSIS — H409 Unspecified glaucoma: Secondary | ICD-10-CM | POA: Insufficient documentation

## 2016-03-03 DIAGNOSIS — I714 Abdominal aortic aneurysm, without rupture: Secondary | ICD-10-CM | POA: Diagnosis not present

## 2016-03-03 DIAGNOSIS — N289 Disorder of kidney and ureter, unspecified: Secondary | ICD-10-CM | POA: Diagnosis not present

## 2016-03-03 DIAGNOSIS — Z952 Presence of prosthetic heart valve: Secondary | ICD-10-CM | POA: Insufficient documentation

## 2016-03-03 HISTORY — DX: Other complications of anesthesia, initial encounter: T88.59XA

## 2016-03-03 HISTORY — DX: Spinal stenosis, lumbar region without neurogenic claudication: M48.061

## 2016-03-03 HISTORY — DX: Adverse effect of unspecified anesthetic, initial encounter: T41.45XA

## 2016-03-03 LAB — BASIC METABOLIC PANEL
Anion gap: 7 (ref 5–15)
BUN: 30 mg/dL — ABNORMAL HIGH (ref 6–20)
CO2: 25 mmol/L (ref 22–32)
Calcium: 9.7 mg/dL (ref 8.9–10.3)
Chloride: 106 mmol/L (ref 101–111)
Creatinine, Ser: 1.62 mg/dL — ABNORMAL HIGH (ref 0.61–1.24)
GFR calc Af Amer: 46 mL/min — ABNORMAL LOW (ref >60)
GFR calc non Af Amer: 39 mL/min — ABNORMAL LOW (ref >60)
Glucose, Bld: 61 mg/dL — ABNORMAL LOW (ref 65–99)
Potassium: 4.1 mmol/L (ref 3.5–5.1)
Sodium: 138 mmol/L (ref 135–145)

## 2016-03-03 LAB — CBC
HCT: 34.8 % — ABNORMAL LOW (ref 39.0–52.0)
Hemoglobin: 11.6 g/dL — ABNORMAL LOW (ref 13.0–17.0)
MCH: 30.9 pg (ref 26.0–34.0)
MCHC: 33.3 g/dL (ref 30.0–36.0)
MCV: 92.8 fL (ref 78.0–100.0)
Platelets: 114 10*3/uL — ABNORMAL LOW (ref 150–400)
RBC: 3.75 MIL/uL — ABNORMAL LOW (ref 4.22–5.81)
RDW: 13.2 % (ref 11.5–15.5)
WBC: 4.5 10*3/uL (ref 4.0–10.5)

## 2016-03-03 LAB — SURGICAL PCR SCREEN
MRSA, PCR: NEGATIVE
Staphylococcus aureus: POSITIVE — AB

## 2016-03-03 NOTE — Progress Notes (Signed)
Pt denies SOB and chest pain but is under the care of Dr. [name], Cardiology. Pt stated that MD advised that he continue taking Aspirin. Pt stated that he takes Aspirin at night. Pt chart forwarded to anesthesia to review abnormal labs and clearance note on chart. Spoke with Lorriane Shire, Surgical Scheduler, to make MD aware to sign orders.

## 2016-03-04 ENCOUNTER — Other Ambulatory Visit: Payer: Self-pay | Admitting: Family Medicine

## 2016-03-04 MED ORDER — AMLODIPINE BESYLATE 5 MG PO TABS: 5.0000 mg | ORAL_TABLET | Freq: Every day | ORAL | 3 refills | Status: DC

## 2016-03-04 NOTE — Progress Notes (Addendum)
Anesthesia chart review: Patient is a 75 year old male scheduled for right L5-S1 laminectomy and foraminotomy on 03/08/16 by Dr. Saintclair Halsted.  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 Shanon Brow; [facility]), moderate aortic stenosis and moderately dilated aortic root (11/2015 echo),  paroxysmal atrial fib (post cardiac surgery), hypertension, hyperlipidemia, glaucoma, depression, chronic back pain, rheumatoid and osteoarthritis, appendectomy '10, cholecystectomy '10, hammer toe with K wire fixation 10/09/15.  He has moderate pulmonary hypertension by 2016 and 2017 echocardiograms. He reported memory loss following his May 2017 surgery (done under MAC anesthesia; midazolam, propofol, lidocaine). He apparently had to be admitted 10/11/15-10/13/15 for altered mental status following surgery. He had decreased PO intake and some nausea. On PCP follow-up, labs showed acute renal insufficiency with Cr 1.9 and was referred to the ED for hydration. Dr. Caryl Bis has been following with plans for nephrology referral (Dr. Jac Canavan with [facility]).   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 He lives in [place].  - PCP is Dr. [name]. - Cardiologist is Dr. Ida Rogue, last visit 11/06/15. On 02/24/16, he wrote, "Acceptable risk for surgery, no further testing needed. Will need preoperative antibiotics for prosthetic Valve. Ideally would like to stay on aspirin but if surgery not possible on aspirin, could hold this for minimal amount of time possible." - Neurologist is Dr. Gurney Maxin. - Rheumatologist is Dr. Percell Boston.  Meds include aspirin, Zyrtec, Aricept, fenofibrate, Duragesic patch, Prozac, folate acid, gabapentin, hydralazine, Norco, Plaquenil, lisinopril (recently  changed from lisinopril-HCTZ due to renal function), Alphagan ophthalmic.  BP (!) 120/54   Pulse 65   Temp 36.6 C   Resp 20   Ht 6' (1.829 m)   Wt 192 lb 1.6 oz (87.1 kg)   SpO2 98%   BMI 26.05 kg/m   11/27/15 Echo: Study Conclusions - Left ventricle: The cavity size was mildly dilated. Systolic   function was normal. The estimated ejection fraction was in the   range of 50% to 55%. Wall motion was normal; there were no   regional wall motion abnormalities. Features are consistent with   a pseudonormal left ventricular filling pattern, with concomitant   abnormal relaxation and increased filling pressure (grade 2   diastolic dysfunction). - Aortic valve: A bioprosthesis was present. There was moderate   stenosis. Mean gradient (S): 20 mm Hg. Valve area (VTI): 1.24   cm^2. - Aorta: Aortic root dimension: 44 mm (ED). - Aortic root: The aortic root was moderately dilated. - Mitral valve: There was mild regurgitation. - Left atrium: The atrium was moderately dilated. - Right atrium: The atrium was mildly dilated. - Pulmonary arteries: Systolic pressure was moderately increased.   PA peak pressure: 53 mm Hg (S). Impressions: - Relatively stable findings since last year except for increased   pulmonary pressure from 42 to 53 mm Hg. (Dr. [name] felt findings had not significantly changed in the past year. Right heart pressure elevated, likely from history of smoking, prosthetic valve, renal dysfunction. Could repeat in one year.)  By notes, he had mild to moderate non-obstructive CAD by cath done prior to AVR/TAA repair.   06/03/15 Carotid U/S: Impressions: Normal carotid arteries, bilaterally. Patent vertebral arteries with antegrade flow. Normal subclavian arteries, bilaterally.  Preoperative labs noted. BUN 30, Cr 1.62, stable over the past four months. H/H 11.6/34.8, PLT 114. Glucose  61.   He does have (recurrent) moderate AS and moderate pulmonary hypertension by recent echo. He  has been cleared by his cardiologist. His renal function will need to be monitored post-operatively. If no acute changes then I would anticipate that he can proceed as planned.  George Hugh [facility] Short Stay Center/Anesthesiology Phone [phone] 03/04/2016 3:33 PM  Addendum: I did receive nephrology records from [place] (the MD signature was not included in the papers faxed, but referral was to Dr. Candiss Norse). He was seen on 01/05/16 for CKD stage III. Physician felt patient's baseline Cr to be ~ 1.6-1.7, GFR 42 with fluctuations due to variation in volume status from cardiopulmonary and renal hemodynamics. He recommended maintaining consistent BP in an optimal range and low salt diet with six month follow-up.  George Hugh [facility] Short Stay Center/Anesthesiology Phone [phone] 03/05/2016 10:10 AM

## 2016-03-08 ENCOUNTER — Ambulatory Visit (HOSPITAL_COMMUNITY): Payer: Medicare Other | Admitting: Vascular Surgery

## 2016-03-08 ENCOUNTER — Encounter (HOSPITAL_COMMUNITY): Payer: Self-pay | Admitting: Certified Registered"

## 2016-03-08 ENCOUNTER — Ambulatory Visit (HOSPITAL_COMMUNITY): Payer: Medicare Other

## 2016-03-08 ENCOUNTER — Ambulatory Visit (HOSPITAL_COMMUNITY)
Admission: RE | Admit: 2016-03-08 | Discharge: 2016-03-08 | Disposition: A | Discharge status: Home / Self Care | Payer: Medicare Other | Source: Ambulatory Visit | Attending: Neurosurgery | Admitting: Neurosurgery

## 2016-03-08 ENCOUNTER — Other Ambulatory Visit: Payer: Self-pay

## 2016-03-08 ENCOUNTER — Encounter (HOSPITAL_COMMUNITY)
Admission: RE | Disposition: A | Discharge status: Home / Self Care | Payer: Self-pay | Source: Ambulatory Visit | Attending: Neurosurgery

## 2016-03-08 DIAGNOSIS — I080 Rheumatic disorders of both mitral and aortic valves: Secondary | ICD-10-CM | POA: Insufficient documentation

## 2016-03-08 DIAGNOSIS — Z419 Encounter for procedure for purposes other than remedying health state, unspecified: Secondary | ICD-10-CM

## 2016-03-08 DIAGNOSIS — I509 Heart failure, unspecified: Secondary | ICD-10-CM | POA: Insufficient documentation

## 2016-03-08 DIAGNOSIS — M4726 Other spondylosis with radiculopathy, lumbar region: Secondary | ICD-10-CM | POA: Insufficient documentation

## 2016-03-08 DIAGNOSIS — Z8249 Family history of ischemic heart disease and other diseases of the circulatory system: Secondary | ICD-10-CM | POA: Diagnosis not present

## 2016-03-08 DIAGNOSIS — Z952 Presence of prosthetic heart valve: Secondary | ICD-10-CM | POA: Insufficient documentation

## 2016-03-08 DIAGNOSIS — M19071 Primary osteoarthritis, right ankle and foot: Secondary | ICD-10-CM | POA: Insufficient documentation

## 2016-03-08 DIAGNOSIS — Z885 Allergy status to narcotic agent status: Secondary | ICD-10-CM | POA: Insufficient documentation

## 2016-03-08 DIAGNOSIS — I2510 Atherosclerotic heart disease of native coronary artery without angina pectoris: Secondary | ICD-10-CM | POA: Insufficient documentation

## 2016-03-08 DIAGNOSIS — M069 Rheumatoid arthritis, unspecified: Secondary | ICD-10-CM | POA: Diagnosis not present

## 2016-03-08 DIAGNOSIS — H409 Unspecified glaucoma: Secondary | ICD-10-CM | POA: Diagnosis not present

## 2016-03-08 DIAGNOSIS — M549 Dorsalgia, unspecified: Secondary | ICD-10-CM | POA: Diagnosis not present

## 2016-03-08 DIAGNOSIS — E785 Hyperlipidemia, unspecified: Secondary | ICD-10-CM | POA: Diagnosis not present

## 2016-03-08 DIAGNOSIS — I480 Paroxysmal atrial fibrillation: Secondary | ICD-10-CM | POA: Insufficient documentation

## 2016-03-08 DIAGNOSIS — M4807 Spinal stenosis, lumbosacral region: Secondary | ICD-10-CM | POA: Insufficient documentation

## 2016-03-08 DIAGNOSIS — M48062 Spinal stenosis, lumbar region with neurogenic claudication: Secondary | ICD-10-CM

## 2016-03-08 DIAGNOSIS — Z880 Allergy status to penicillin: Secondary | ICD-10-CM | POA: Insufficient documentation

## 2016-03-08 DIAGNOSIS — I110 Hypertensive heart disease with heart failure: Secondary | ICD-10-CM | POA: Insufficient documentation

## 2016-03-08 DIAGNOSIS — G8929 Other chronic pain: Secondary | ICD-10-CM | POA: Insufficient documentation

## 2016-03-08 DIAGNOSIS — M48061 Spinal stenosis, lumbar region without neurogenic claudication: Secondary | ICD-10-CM | POA: Diagnosis not present

## 2016-03-08 DIAGNOSIS — M5136 Other intervertebral disc degeneration, lumbar region: Secondary | ICD-10-CM | POA: Diagnosis not present

## 2016-03-08 DIAGNOSIS — Z961 Presence of intraocular lens: Secondary | ICD-10-CM | POA: Insufficient documentation

## 2016-03-08 DIAGNOSIS — R011 Cardiac murmur, unspecified: Secondary | ICD-10-CM | POA: Insufficient documentation

## 2016-03-08 DIAGNOSIS — Z87891 Personal history of nicotine dependence: Secondary | ICD-10-CM | POA: Insufficient documentation

## 2016-03-08 DIAGNOSIS — Z9841 Cataract extraction status, right eye: Secondary | ICD-10-CM | POA: Diagnosis not present

## 2016-03-08 DIAGNOSIS — F329 Major depressive disorder, single episode, unspecified: Secondary | ICD-10-CM | POA: Insufficient documentation

## 2016-03-08 DIAGNOSIS — Z825 Family history of asthma and other chronic lower respiratory diseases: Secondary | ICD-10-CM | POA: Insufficient documentation

## 2016-03-08 DIAGNOSIS — Z7982 Long term (current) use of aspirin: Secondary | ICD-10-CM | POA: Insufficient documentation

## 2016-03-08 DIAGNOSIS — I739 Peripheral vascular disease, unspecified: Secondary | ICD-10-CM | POA: Insufficient documentation

## 2016-03-08 DIAGNOSIS — Z96652 Presence of left artificial knee joint: Secondary | ICD-10-CM | POA: Diagnosis not present

## 2016-03-08 DIAGNOSIS — M5104 Intervertebral disc disorders with myelopathy, thoracic region: Secondary | ICD-10-CM | POA: Diagnosis present

## 2016-03-08 DIAGNOSIS — Z9842 Cataract extraction status, left eye: Secondary | ICD-10-CM | POA: Diagnosis not present

## 2016-03-08 DIAGNOSIS — M5416 Radiculopathy, lumbar region: Secondary | ICD-10-CM

## 2016-03-08 DIAGNOSIS — M19072 Primary osteoarthritis, left ankle and foot: Secondary | ICD-10-CM | POA: Insufficient documentation

## 2016-03-08 HISTORY — PX: LUMBAR LAMINECTOMY/DECOMPRESSION MICRODISCECTOMY: SHX5026

## 2016-03-08 SURGERY — LUMBAR LAMINECTOMY/DECOMPRESSION MICRODISCECTOMY 1 LEVEL
Anesthesia: General | Site: Spine Lumbar | Laterality: Right

## 2016-03-08 MED ORDER — FOLIC ACID 1 MG PO TABS: 1.0000 mg | ORAL_TABLET | Freq: Two times a day (BID) | ORAL | Status: DC

## 2016-03-08 MED ORDER — TAMSULOSIN HCL 0.4 MG PO CAPS
0.4000 mg | ORAL_CAPSULE | Freq: Every day | ORAL | Status: DC
  Administered 2016-03-08: 0.4 mg via ORAL
  Filled 2016-03-08: qty 1

## 2016-03-08 MED ORDER — FENTANYL CITRATE (PF) 100 MCG/2ML IJ SOLN
INTRAMUSCULAR | Status: AC
  Filled 2016-03-08: qty 2

## 2016-03-08 MED ORDER — SUGAMMADEX SODIUM 200 MG/2ML IV SOLN
INTRAVENOUS | Status: DC | PRN
  Administered 2016-03-08: 200 mg via INTRAVENOUS

## 2016-03-08 MED ORDER — PHENYLEPHRINE HCL 10 MG/ML IJ SOLN
INTRAVENOUS | Status: DC | PRN
  Administered 2016-03-08: 15 ug/min via INTRAVENOUS

## 2016-03-08 MED ORDER — CYCLOBENZAPRINE HCL 10 MG PO TABS: 10.0000 mg | ORAL_TABLET | Freq: Three times a day (TID) | ORAL | Status: DC | PRN

## 2016-03-08 MED ORDER — VANCOMYCIN HCL IN DEXTROSE 1-5 GM/200ML-% IV SOLN: 1000.0000 mg | Freq: Once | INTRAVENOUS | Status: DC

## 2016-03-08 MED ORDER — SODIUM CHLORIDE 0.9% FLUSH
3.0000 mL | Freq: Two times a day (BID) | INTRAVENOUS | Status: DC
  Administered 2016-03-08: 3 mL via INTRAVENOUS

## 2016-03-08 MED ORDER — SODIUM CHLORIDE 0.9 % IR SOLN
Status: DC | PRN
  Administered 2016-03-08: 500 mL

## 2016-03-08 MED ORDER — FENTANYL 25 MCG/HR TD PT72: 25.0000 ug | MEDICATED_PATCH | TRANSDERMAL | Status: DC

## 2016-03-08 MED ORDER — HYDROMORPHONE HCL 1 MG/ML IJ SOLN: 0.5000 mg | INTRAMUSCULAR | Status: DC | PRN

## 2016-03-08 MED ORDER — ONDANSETRON HCL 4 MG/2ML IJ SOLN
INTRAMUSCULAR | Status: AC
  Filled 2016-03-08: qty 2

## 2016-03-08 MED ORDER — 0.9 % SODIUM CHLORIDE (POUR BTL) OPTIME
TOPICAL | Status: DC | PRN
  Administered 2016-03-08: 1000 mL

## 2016-03-08 MED ORDER — THROMBIN 5000 UNITS EX SOLR
CUTANEOUS | Status: DC | PRN
  Administered 2016-03-08 (×2): 5000 [IU] via TOPICAL

## 2016-03-08 MED ORDER — FENTANYL CITRATE (PF) 100 MCG/2ML IJ SOLN
INTRAMUSCULAR | Status: DC | PRN
  Administered 2016-03-08: 50 ug via INTRAVENOUS

## 2016-03-08 MED ORDER — AMOXICILLIN 500 MG PO CAPS: 2000.0000 mg | ORAL_CAPSULE | ORAL | Status: DC | PRN

## 2016-03-08 MED ORDER — LACTATED RINGERS IV SOLN: 1000.0000 mL | INTRAVENOUS | Status: DC

## 2016-03-08 MED ORDER — HYDRALAZINE HCL 20 MG/ML IJ SOLN
10.0000 mg | Freq: Once | INTRAMUSCULAR | Status: AC
  Administered 2016-03-08: 10 mg via INTRAVENOUS

## 2016-03-08 MED ORDER — AMLODIPINE BESYLATE 5 MG PO TABS: 5.0000 mg | ORAL_TABLET | Freq: Every day | ORAL | Status: DC

## 2016-03-08 MED ORDER — HYDRALAZINE HCL 25 MG PO TABS
25.0000 mg | ORAL_TABLET | Freq: Three times a day (TID) | ORAL | Status: DC
  Administered 2016-03-08: 25 mg via ORAL
  Filled 2016-03-08 (×2): qty 1

## 2016-03-08 MED ORDER — DEXAMETHASONE SODIUM PHOSPHATE 10 MG/ML IJ SOLN
INTRAMUSCULAR | Status: AC
  Filled 2016-03-08: qty 1

## 2016-03-08 MED ORDER — DONEPEZIL HCL 10 MG PO TABS
10.0000 mg | ORAL_TABLET | Freq: Every day | ORAL | Status: DC
  Filled 2016-03-08: qty 1

## 2016-03-08 MED ORDER — CHLORHEXIDINE GLUCONATE CLOTH 2 % EX PADS: 6.0000 | MEDICATED_PAD | Freq: Once | CUTANEOUS | Status: DC

## 2016-03-08 MED ORDER — ONDANSETRON HCL 4 MG/2ML IJ SOLN
INTRAMUSCULAR | Status: DC | PRN
  Administered 2016-03-08: 4 mg via INTRAVENOUS

## 2016-03-08 MED ORDER — FLUOXETINE HCL 20 MG PO CAPS: 20.0000 mg | ORAL_CAPSULE | Freq: Every day | ORAL | Status: DC

## 2016-03-08 MED ORDER — PREDNISONE 5 MG PO TABS
5.0000 mg | ORAL_TABLET | Freq: Every day | ORAL | Status: DC
  Filled 2016-03-08: qty 1

## 2016-03-08 MED ORDER — LISINOPRIL 20 MG PO TABS: 20.0000 mg | ORAL_TABLET | Freq: Every evening | ORAL | 3 refills | Status: DC

## 2016-03-08 MED ORDER — BRIMONIDINE TARTRATE 0.2 % OP SOLN
1.0000 [drp] | Freq: Every morning | OPHTHALMIC | Status: DC
  Filled 2016-03-08: qty 5

## 2016-03-08 MED ORDER — MIDAZOLAM HCL 5 MG/5ML IJ SOLN: 5.0000 mg | Freq: Once | INTRAMUSCULAR | Status: DC

## 2016-03-08 MED ORDER — FENTANYL CITRATE (PF) 100 MCG/2ML IJ SOLN: 25.0000 ug | INTRAMUSCULAR | Status: DC | PRN

## 2016-03-08 MED ORDER — BUPIVACAINE HCL (PF) 0.25 % IJ SOLN
INTRAMUSCULAR | Status: DC | PRN
  Administered 2016-03-08: 10 mL

## 2016-03-08 MED ORDER — PROPOFOL 10 MG/ML IV BOLUS
INTRAVENOUS | Status: DC | PRN
  Administered 2016-03-08: 120 mg via INTRAVENOUS

## 2016-03-08 MED ORDER — DEXAMETHASONE SODIUM PHOSPHATE 10 MG/ML IJ SOLN
10.0000 mg | INTRAMUSCULAR | Status: AC
  Administered 2016-03-08: 10 mg via INTRAVENOUS

## 2016-03-08 MED ORDER — VITAMIN B-12 1000 MCG PO TABS
1000.0000 ug | ORAL_TABLET | Freq: Every day | ORAL | Status: DC
  Administered 2016-03-08: 1000 ug via ORAL
  Filled 2016-03-08: qty 1

## 2016-03-08 MED ORDER — LISINOPRIL 20 MG PO TABS: 20.0000 mg | ORAL_TABLET | Freq: Every evening | ORAL | Status: DC

## 2016-03-08 MED ORDER — SUGAMMADEX SODIUM 200 MG/2ML IV SOLN
INTRAVENOUS | Status: AC
  Filled 2016-03-08: qty 2

## 2016-03-08 MED ORDER — TRAZODONE HCL 50 MG PO TABS
50.0000 mg | ORAL_TABLET | Freq: Every evening | ORAL | Status: DC | PRN
  Filled 2016-03-08: qty 1

## 2016-03-08 MED ORDER — LORATADINE 10 MG PO TABS
10.0000 mg | ORAL_TABLET | Freq: Every day | ORAL | Status: DC
Start: 2016-03-09 — End: 2016-03-08

## 2016-03-08 MED ORDER — FENTANYL CITRATE (PF) 100 MCG/2ML IJ SOLN: 100.0000 ug | Freq: Once | INTRAMUSCULAR | Status: DC

## 2016-03-08 MED ORDER — ACETAMINOPHEN 650 MG RE SUPP: 650.0000 mg | RECTAL | Status: DC | PRN

## 2016-03-08 MED ORDER — CEFAZOLIN SODIUM-DEXTROSE 2-4 GM/100ML-% IV SOLN: 2.0000 g | Freq: Three times a day (TID) | INTRAVENOUS | Status: DC

## 2016-03-08 MED ORDER — CHOLECALCIFEROL 10 MCG (400 UNIT) PO TABS
400.0000 [IU] | ORAL_TABLET | Freq: Every day | ORAL | Status: DC
  Administered 2016-03-08: 400 [IU] via ORAL
  Filled 2016-03-08: qty 1

## 2016-03-08 MED ORDER — LIDOCAINE 2% (20 MG/ML) 5 ML SYRINGE
INTRAMUSCULAR | Status: AC
  Filled 2016-03-08: qty 5

## 2016-03-08 MED ORDER — HYDRALAZINE HCL 20 MG/ML IJ SOLN
INTRAMUSCULAR | Status: AC
  Administered 2016-03-08: 10 mg via INTRAVENOUS
  Filled 2016-03-08: qty 1

## 2016-03-08 MED ORDER — LIDOCAINE-EPINEPHRINE 1 %-1:100000 IJ SOLN
INTRAMUSCULAR | Status: AC
  Filled 2016-03-08: qty 1

## 2016-03-08 MED ORDER — VANCOMYCIN HCL IN DEXTROSE 1-5 GM/200ML-% IV SOLN
INTRAVENOUS | Status: AC
  Filled 2016-03-08: qty 200

## 2016-03-08 MED ORDER — ACETAMINOPHEN 10 MG/ML IV SOLN
1000.0000 mg | INTRAVENOUS | Status: AC
  Administered 2016-03-08: 1000 mg via INTRAVENOUS
  Filled 2016-03-08: qty 100

## 2016-03-08 MED ORDER — GABAPENTIN 800 MG PO TABS
800.0000 mg | ORAL_TABLET | Freq: Three times a day (TID) | ORAL | Status: DC
  Filled 2016-03-08: qty 1

## 2016-03-08 MED ORDER — MENTHOL 3 MG MT LOZG: 1.0000 | LOZENGE | OROMUCOSAL | Status: DC | PRN

## 2016-03-08 MED ORDER — LIDOCAINE-EPINEPHRINE 1 %-1:100000 IJ SOLN
INTRAMUSCULAR | Status: DC | PRN
  Administered 2016-03-08: 10 mL

## 2016-03-08 MED ORDER — FENOFIBRATE 160 MG PO TABS: 160.0000 mg | ORAL_TABLET | Freq: Every day | ORAL | Status: DC

## 2016-03-08 MED ORDER — LISINOPRIL-HYDROCHLOROTHIAZIDE 20-25 MG PO TABS
1.0000 | ORAL_TABLET | Freq: Every day | ORAL | Status: DC
Start: 2016-03-08 — End: 2016-03-08

## 2016-03-08 MED ORDER — PHENOL 1.4 % MT LIQD: 1.0000 | OROMUCOSAL | Status: DC | PRN

## 2016-03-08 MED ORDER — HEMOSTATIC AGENTS (NO CHARGE) OPTIME
TOPICAL | Status: DC | PRN
  Administered 2016-03-08: 1 via TOPICAL

## 2016-03-08 MED ORDER — SIMVASTATIN 20 MG PO TABS: 40.0000 mg | ORAL_TABLET | Freq: Every day | ORAL | Status: DC

## 2016-03-08 MED ORDER — ROCURONIUM BROMIDE 100 MG/10ML IV SOLN
INTRAVENOUS | Status: DC | PRN
  Administered 2016-03-08: 10 mg via INTRAVENOUS
  Administered 2016-03-08: 40 mg via INTRAVENOUS

## 2016-03-08 MED ORDER — ASPIRIN 325 MG PO TABS
325.0000 mg | ORAL_TABLET | Freq: Every day | ORAL | Status: DC
  Filled 2016-03-08: qty 1

## 2016-03-08 MED ORDER — ASPIRIN EC 325 MG PO TBEC
325.0000 mg | DELAYED_RELEASE_TABLET | Freq: Every day | ORAL | Status: DC
  Administered 2016-03-08: 325 mg via ORAL
  Filled 2016-03-08: qty 1

## 2016-03-08 MED ORDER — ROCURONIUM BROMIDE 10 MG/ML (PF) SYRINGE
PREFILLED_SYRINGE | INTRAVENOUS | Status: AC
  Filled 2016-03-08: qty 10

## 2016-03-08 MED ORDER — HYDROXYCHLOROQUINE SULFATE 200 MG PO TABS: 200.0000 mg | ORAL_TABLET | Freq: Every day | ORAL | Status: DC

## 2016-03-08 MED ORDER — ACETAMINOPHEN 325 MG PO TABS: 650.0000 mg | ORAL_TABLET | ORAL | Status: DC | PRN

## 2016-03-08 MED ORDER — HYDROCODONE-ACETAMINOPHEN 5-325 MG PO TABS
1.0000 | ORAL_TABLET | Freq: Four times a day (QID) | ORAL | Status: DC
  Administered 2016-03-08: 1 via ORAL
  Filled 2016-03-08: qty 1

## 2016-03-08 MED ORDER — VANCOMYCIN HCL IN DEXTROSE 1-5 GM/200ML-% IV SOLN
1000.0000 mg | INTRAVENOUS | Status: AC
  Administered 2016-03-08: 1000 mg via INTRAVENOUS

## 2016-03-08 MED ORDER — THROMBIN 5000 UNITS EX SOLR
CUTANEOUS | Status: AC
  Filled 2016-03-08: qty 10000

## 2016-03-08 MED ORDER — ONDANSETRON HCL 4 MG/2ML IJ SOLN: 4.0000 mg | INTRAMUSCULAR | Status: DC | PRN

## 2016-03-08 MED ORDER — BUPIVACAINE HCL (PF) 0.25 % IJ SOLN
INTRAMUSCULAR | Status: AC
  Filled 2016-03-08: qty 30

## 2016-03-08 MED ORDER — SODIUM CHLORIDE 0.9% FLUSH
3.0000 mL | INTRAVENOUS | Status: DC | PRN
Start: 2016-03-08 — End: 2016-03-08

## 2016-03-08 MED ORDER — GABAPENTIN 400 MG PO CAPS
800.0000 mg | ORAL_CAPSULE | Freq: Two times a day (BID) | ORAL | Status: DC
  Administered 2016-03-08: 800 mg via ORAL
  Filled 2016-03-08: qty 2

## 2016-03-08 MED ORDER — PROMETHAZINE HCL 25 MG/ML IJ SOLN: 6.2500 mg | INTRAMUSCULAR | Status: DC | PRN

## 2016-03-08 MED ORDER — OXYCODONE-ACETAMINOPHEN 5-325 MG PO TABS: 1.0000 | ORAL_TABLET | ORAL | Status: DC | PRN

## 2016-03-08 MED ORDER — LIDOCAINE HCL (CARDIAC) 20 MG/ML IV SOLN
INTRAVENOUS | Status: DC | PRN
  Administered 2016-03-08: 80 mg via INTRAVENOUS

## 2016-03-08 MED ORDER — LACTATED RINGERS IV SOLN
INTRAVENOUS | Status: DC
  Administered 2016-03-08 (×2): via INTRAVENOUS

## 2016-03-08 SURGICAL SUPPLY — 57 items
BAG DECANTER FOR FLEXI CONT (MISCELLANEOUS) ×2 IMPLANT
BENZOIN TINCTURE PRP APPL 2/3 (GAUZE/BANDAGES/DRESSINGS) ×2 IMPLANT
BLADE CLIPPER SURG (BLADE) IMPLANT
BLADE SURG 11 STRL SS (BLADE) ×2 IMPLANT
BUR MATCHSTICK NEURO 3.0 LAGG (BURR) ×2 IMPLANT
BUR PRECISION FLUTE 6.0 (BURR) IMPLANT
CANISTER SUCT 3000ML PPV (MISCELLANEOUS) ×2 IMPLANT
DECANTER SPIKE VIAL GLASS SM (MISCELLANEOUS) ×2 IMPLANT
DERMABOND ADVANCED (GAUZE/BANDAGES/DRESSINGS) ×1
DERMABOND ADVANCED .7 DNX12 (GAUZE/BANDAGES/DRESSINGS) ×1 IMPLANT
DRAPE HALF SHEET 40X57 (DRAPES) IMPLANT
DRAPE LAPAROTOMY 100X72X124 (DRAPES) ×2 IMPLANT
DRAPE MICROSCOPE LEICA (MISCELLANEOUS) ×2 IMPLANT
DRAPE POUCH INSTRU U-SHP 10X18 (DRAPES) ×2 IMPLANT
DRAPE SURG 17X23 STRL (DRAPES) ×2 IMPLANT
DRSG OPSITE POSTOP 4X10 (GAUZE/BANDAGES/DRESSINGS) ×2 IMPLANT
DRSG OPSITE POSTOP 4X6 (GAUZE/BANDAGES/DRESSINGS) ×2 IMPLANT
DURAPREP 26ML APPLICATOR (WOUND CARE) ×2 IMPLANT
ELECT REM PT RETURN 9FT ADLT (ELECTROSURGICAL) ×2
ELECTRODE REM PT RTRN 9FT ADLT (ELECTROSURGICAL) ×1 IMPLANT
GAUZE SPONGE 4X4 12PLY STRL (GAUZE/BANDAGES/DRESSINGS) IMPLANT
GAUZE SPONGE 4X4 16PLY XRAY LF (GAUZE/BANDAGES/DRESSINGS) IMPLANT
GLOVE BIO SURGEON STRL SZ8 (GLOVE) ×2 IMPLANT
GLOVE BIOGEL PI IND STRL 6.5 (GLOVE) ×1 IMPLANT
GLOVE BIOGEL PI IND STRL 7.0 (GLOVE) ×1 IMPLANT
GLOVE BIOGEL PI IND STRL 8 (GLOVE) ×2 IMPLANT
GLOVE BIOGEL PI INDICATOR 6.5 (GLOVE) ×1
GLOVE BIOGEL PI INDICATOR 7.0 (GLOVE) ×1
GLOVE BIOGEL PI INDICATOR 8 (GLOVE) ×2
GLOVE ECLIPSE 7.5 STRL STRAW (GLOVE) ×6 IMPLANT
GLOVE ECLIPSE 9.0 STRL (GLOVE) ×2 IMPLANT
GLOVE EXAM NITRILE LRG STRL (GLOVE) IMPLANT
GLOVE EXAM NITRILE XL STR (GLOVE) IMPLANT
GLOVE EXAM NITRILE XS STR PU (GLOVE) IMPLANT
GLOVE INDICATOR 8.5 STRL (GLOVE) ×2 IMPLANT
GLOVE SURG SS PI 6.5 STRL IVOR (GLOVE) ×2 IMPLANT
GOWN STRL REUS W/ TWL LRG LVL3 (GOWN DISPOSABLE) ×1 IMPLANT
GOWN STRL REUS W/ TWL XL LVL3 (GOWN DISPOSABLE) ×2 IMPLANT
GOWN STRL REUS W/TWL 2XL LVL3 (GOWN DISPOSABLE) ×2 IMPLANT
GOWN STRL REUS W/TWL LRG LVL3 (GOWN DISPOSABLE) ×1
GOWN STRL REUS W/TWL XL LVL3 (GOWN DISPOSABLE) ×2
KIT BASIN OR (CUSTOM PROCEDURE TRAY) ×2 IMPLANT
KIT ROOM TURNOVER OR (KITS) ×2 IMPLANT
NEEDLE HYPO 22GX1.5 SAFETY (NEEDLE) ×2 IMPLANT
NEEDLE SPNL 22GX3.5 QUINCKE BK (NEEDLE) ×2 IMPLANT
NS IRRIG 1000ML POUR BTL (IV SOLUTION) ×2 IMPLANT
PACK LAMINECTOMY NEURO (CUSTOM PROCEDURE TRAY) ×2 IMPLANT
RUBBERBAND STERILE (MISCELLANEOUS) ×4 IMPLANT
SPONGE SURGIFOAM ABS GEL SZ50 (HEMOSTASIS) ×2 IMPLANT
STRIP CLOSURE SKIN 1/2X4 (GAUZE/BANDAGES/DRESSINGS) ×2 IMPLANT
SUT VIC AB 0 CT1 18XCR BRD8 (SUTURE) ×1 IMPLANT
SUT VIC AB 0 CT1 8-18 (SUTURE) ×1
SUT VIC AB 2-0 CT1 18 (SUTURE) ×2 IMPLANT
SUT VICRYL 4-0 PS2 18IN ABS (SUTURE) ×2 IMPLANT
TOWEL OR 17X24 6PK STRL BLUE (TOWEL DISPOSABLE) ×2 IMPLANT
TOWEL OR 17X26 10 PK STRL BLUE (TOWEL DISPOSABLE) ×2 IMPLANT
WATER STERILE IRR 1000ML POUR (IV SOLUTION) ×2 IMPLANT

## 2016-03-08 NOTE — Anesthesia Procedure Notes (Signed)
Procedure Name: Intubation Date/Time: 03/08/2016 11:09 AM Performed by: Myna Bright Pre-anesthesia Checklist: Patient identified, Emergency Drugs available, Suction available and Patient being monitored Patient Re-evaluated:Patient Re-evaluated prior to inductionOxygen Delivery Method: Circle system utilized Preoxygenation: Pre-oxygenation with 100% oxygen Intubation Type: IV induction Ventilation: Mask ventilation without difficulty Laryngoscope Size: Mac and 4 Grade View: Grade I Tube type: Oral Tube size: 7.5 mm Number of attempts: 1 Airway Equipment and Method: Stylet Placement Confirmation: ETT inserted through vocal cords under direct vision,  positive ETCO2 and breath sounds checked- equal and bilateral Secured at: 22 cm Tube secured with: Tape Dental Injury: Teeth and Oropharynx as per pre-operative assessment

## 2016-03-08 NOTE — H&P (Signed)
GOLDMAN CULLOM is an 75 y.o. male.   Chief Complaint: Back and right leg pain HPI: Patient is very pleasant 75 year old gentleman is a long-standing back and right leg pain rating down and L5 nerve root pattern and posterior thigh posterior lateral Top his foot and big toe. Workup revealed severe L5 foraminal stenosis predominantly from an osteophyte extending in the lateral recess from L5-S1. There is also chronic disc bulge. Patient had very little back pain and had no evidence of instability and due to conservative treatment imaging findings and progressive clinical syndrome recommended foraminotomies of the L5 nerve root from the L5-S1 interspace the risks and benefits of the procedure with him as well as perioperative course expectations of outcome and alternatives of surgery and he understands and agrees to proceed forward.  Past Medical History:  Diagnosis Date  . Allergy   . Arrhythmia   . 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 Chronic back pain   . Complication of anesthesia    Memory loss 09/2015  . Coronary artery disease   . Depression   . Glaucoma   . H/O thoracic aortic aneurysm repair   . History of being hospitalized    memory lose kidney funtion down blood pressure up  . History of blood transfusion   . History of chicken pox   . Hyperlipidemia   . Hypertension   . Lumbar stenosis   . Paroxysmal a-fib ([place])   . Valvular heart disease     Past Surgical History:  Procedure Laterality Date  . AORTIC VALVE REPLACEMENT  2007   [facility]  [place], [place]  . APPENDECTOMY  2010  . CATARACT EXTRACTION W/ INTRAOCULAR LENS  IMPLANT, BILATERAL    . CHOLECYSTECTOMY  2010  . HAMMER TOE SURGERY Right 10/09/2015   Procedure: HAMMER TOE REPAIR WITH K-WIRE FIXATION RIGHT SECOND TOE;  Surgeon: Albertine Patricia, DPM;  Location: [place];  Service: Podiatry;  Laterality: Right;  WITH LOCAL  . MULTIPLE TOOTH EXTRACTIONS    . REPLACEMENT TOTAL  KNEE Left 2003  . THORACIC AORTIC ANEURYSM REPAIR  2010    Family History  Problem Relation Age of Onset  . Heart failure Mother   . Hypertension Mother   . Asthma Mother   . Heart attack Father 44    MI  . Hypertension Sister    Social History:  reports that he quit smoking about 34 years ago. His smoking use included Cigarettes. He has a 32.00 pack-year smoking history. He has never used smokeless tobacco. He reports that he drinks about 0.6 oz of alcohol per week . He reports that he does not use drugs.  Allergies:  Allergies  Allergen Reactions  . Penicillins Hives and Swelling    SWELLING REACTION UNSPECIFIED PATIENT HAS TAKEN AMOXICILLIN ON MED HX FROM [facility] Has patient had a PCN reaction causing immediate rash, facial/tongue/throat swelling, SOB or lightheadedness with hypotension: Yes Has patient had a PCN reaction causing severe rash involving mucus membranes or skin necrosis: No Has patient had a PCN reaction that required hospitalization No Has patient had a PCN reaction occurring within the last 10 years: No If all of the above answers are "NO", then may proceed with  . Demerol [Meperidine] Hives and Nausea And Vomiting    Facility-Administered Medications Prior to Admission  Medication Dose Route Frequency Provider Last Rate Last Dose  . fentaNYL (SUBLIMAZE) injection 100 mcg  100 mcg Intravenous Once Mohammed Kindle,  MD      . lactated ringers infusion 1,000 mL  1,000 mL Intravenous Continuous Mohammed Kindle, MD 125 mL/hr at 01/19/16 1011 1,000 mL at 01/19/16 1011  . midazolam (VERSED) 5 MG/5ML injection 5 mg  5 mg Intravenous Once Mohammed Kindle, MD       Medications Prior to Admission  Medication Sig Dispense Refill  . amoxicillin (AMOXIL) 500 MG capsule Take 2,000 mg by mouth as needed (prior to dental procedures). Reported on 11/17/2015    . aspirin (GOODSENSE ASPIRIN) 325 MG tablet Take 325 mg by mouth daily.    . brimonidine (ALPHAGAN) 0.2 % ophthalmic solution  Place 1 drop into both eyes every morning.     . cetirizine (ZYRTEC) 10 MG tablet Take 10 mg by mouth daily.    . cholecalciferol (VITAMIN D) 400 units TABS tablet Take 400 Units by mouth daily.    Marland Kitchen donepezil (ARICEPT) 10 MG tablet Take 10 mg by mouth at bedtime.    . fenofibrate 160 MG tablet TAKE ONE TABLET BY MOUTH DAILY 90 tablet 2  . fentaNYL (DURAGESIC - DOSED MCG/HR) 25 MCG/HR patch Apply one patch to skin every 3 days if tolerated (Patient taking differently: Place 25 mcg onto the skin every 3 (three) days. Apply one patch to skin every 3 days if tolerated) 10 patch 0  . FLUoxetine (PROZAC) 20 MG capsule Take 1 capsule (20 mg total) by mouth daily. 90 capsule 1  . folic acid (FOLVITE) 1 MG tablet Take 1 mg by mouth 2 (two) times daily.    Marland Kitchen gabapentin (NEURONTIN) 800 MG tablet Limit 1 tablet by mouth 2-3 times per day if tolerated (Patient taking differently: Take 800 mg by mouth 3 (three) times daily. Limit 1 tablet by mouth 2-3 times per day if tolerated) 270 tablet 0  . hydrALAZINE (APRESOLINE) 25 MG tablet Take 1 tablet (25 mg total) by mouth 3 (three) times daily. 90 tablet 3  . HYDROcodone-acetaminophen (NORCO/VICODIN) 5-325 MG tablet Limit  2-4 tablets by mouth per day for breakthrough pain while wearing fentanyl patch if tolerated (Patient taking differently: Take 1 tablet by mouth 4 (four) times daily. Limit  2-4 tablets by mouth per day for breakthrough pain while wearing fentanyl patch if tolerated) 120 tablet 0  . hydroxychloroquine (PLAQUENIL) 200 MG tablet Take 200 mg by mouth daily.    Marland Kitchen lisinopril (PRINIVIL,ZESTRIL) 20 MG tablet Take 20 mg by mouth every evening.     Marland Kitchen lisinopril-hydrochlorothiazide (PRINZIDE,ZESTORETIC) 20-25 MG tablet TAKE ONE TABLET BY MOUTH EVERY DAY 90 tablet 3  . predniSONE (DELTASONE) 5 MG tablet Take 5 mg by mouth daily as needed. Reported on 11/17/2015    . simvastatin (ZOCOR) 40 MG tablet Take 1 tablet (40 mg total) by mouth at bedtime. 90 tablet 3   . tamsulosin (FLOMAX) 0.4 MG CAPS capsule Take 1 capsule (0.4 mg total) by mouth daily. 90 capsule 1  . traZODone (DESYREL) 50 MG tablet Take 50 mg by mouth at bedtime as needed for sleep. Reported on 10/09/2015    . vitamin B-12 (CYANOCOBALAMIN) 1000 MCG tablet Take 1,000 mcg by mouth daily.    Marland Kitchen amLODipine (NORVASC) 5 MG tablet Take 1 tablet (5 mg total) by mouth daily. 90 tablet 3  . ciprofloxacin (CIPRO) 250 MG tablet Take 1 tablet (250 mg total) by mouth 2 (two) times daily. (Patient not taking: Reported on 03/02/2016) 14 tablet 0  . ciprofloxacin (CIPRO) 250 MG tablet Take 1 tablet (250 mg total) by  mouth 2 (two) times daily. (Patient not taking: Reported on 03/02/2016) 14 tablet 0    No results found for this or any previous visit (from the past 48 hour(s)). No results found.  Review of Systems  Constitutional: Negative.   HENT: Negative.   Eyes: Negative.   Respiratory: Negative.   Cardiovascular: Negative.   Gastrointestinal: Negative.   Genitourinary: Negative.   Musculoskeletal: Positive for back pain and myalgias.  Skin: Negative.   Neurological: Positive for tingling and sensory change.  Psychiatric/Behavioral: Negative.     Blood pressure (!) 124/55, pulse (!) 51, temperature 97.9 F (36.6 C), temperature source Oral, resp. rate 20, weight 87.1 kg (192 lb), SpO2 93 %. Physical Exam  Constitutional: He is oriented to person, place, and time. He appears well-developed and well-nourished.  HENT:  Head: Normocephalic.  Neck: Normal range of motion.  Respiratory: Effort normal.  GI: Soft. Bowel sounds are normal.  Neurological: He is alert and oriented to person, place, and time. He has normal strength. GCS eye subscore is 4. GCS verbal subscore is 5. GCS motor subscore is 6.  Strength is 5 out of 5 iliopsoas, quads, hip she's, gastrocs, and tibialis, EHL.     Assessment/Plan 75 year old gentleman presents for an L5-S1 laminectomy decompression foraminotomy the L5  nerve root  Keshon Markovitz P, MD 03/08/2016, 10:36 AM

## 2016-03-08 NOTE — Transfer of Care (Signed)
Immediate Anesthesia Transfer of Care Note  Patient: Bryan [name]  Procedure(s) Performed: Procedure(s) with comments: Laminectomy and Foraminotomy - Lumbar Five-Sacral One Right (Right) - Right  Patient Location: PACU  Anesthesia Type:General  Level of Consciousness: awake, alert , oriented and patient cooperative  Airway & Oxygen Therapy: Patient Spontanous Breathing and Patient connected to nasal cannula oxygen  Post-op Assessment: Report given to RN, Post -op Vital signs reviewed and stable and Patient moving all extremities  Post vital signs: Reviewed and stable  Last Vitals:  Vitals:   03/08/16 0839  BP: (!) 124/55  Pulse: (!) 51  Resp: 20  Temp: 36.6 C    Last Pain:  Vitals:   03/08/16 0839  TempSrc: Oral      Patients Stated Pain Goal: 2 ([id number])  Complications: No apparent anesthesia complications

## 2016-03-08 NOTE — Progress Notes (Signed)
Patient alert and oriented, mae's well, voiding adequate amount of urine, swallowing without difficulty, no c/o pain. Patient discharged home with family. Discharged instructions given to patient. Patient and family stated understanding of d/c instructions given and has an appointment with MD.

## 2016-03-08 NOTE — Op Note (Signed)
Preoperative diagnosis: Right L5 radiculopathy from lumbar spondylosis and lateral recess stenosis L5-S1  Postoperative diagnosis: Same  Procedure: Decompressive lumbar laminotomy L5-S1 with foraminotomies of the L5 and S1 nerve roots on the right with microdissection of the right L5 and S1 nerve roots.  Surgeon: Dominica Severin Mazi Brailsford  Asst.: Jonni Sanger pool  Anesthesia: Gen.  EBL: Minimal  History of present illness: Patient is a very pleasant 75 exam is a because worsening back and right leg pain rating to his hip posterior lateral 5 posterior lateral Top his foot and big toe consistent with L5 nerve root pattern. Workup revealed lumbar spondylosis and lateral recess stenosis at L5-S1 causing compression of the right L5 and S1 nerve roots. Due to patient's failure conservative treatment imaging imaging findings and progression of clinical syndrome I recommended laminectomy decompressive with foraminotomies of the L5 and S1 nerve roots. I extensively went over the risks and benefits of that operation with him as well as perioperative course expectations of outcome and alternatives surgery and he understands and agrees to proceed forward.  Operative procedure: Patient brought into the or was induced on general anesthesia positioned prone the Wilson frame his back was prepped and draped in routine sterile fashion preoperative x-ray localize the appropriate level so after infiltration of 10 mL lidocaine with epi a midline incision made and Bovie left car was used to calcification subperiosteal dissections care lamina of what was believed to be L5-S1 however interoperative x-ray confirmed this to be the 45 disc space level so I repositioned the retractor in and attention was taken one disc space below this so the inferior aspect of lamina L5 medial facet complex super aspect of lamina of S1 was drilled out with a high-speed drill I extended the laminotomy further up and I would normally do for an L5-S1 discectomy in  drilled off the superior aspect of facet joint inferior aspect of the L5 lamina under bit the medial facet complexes marked ligament was a vertically and a large spur coming off the medial aspect of facet causing distortion and displacement of the lateral S1 nerve root. This was all bitten away decompress the lateral canal and proximal S1 foramen. Marching superiorly identify the L5 pedicle identified the L5 nerve root under bit extensive amount of spur coming off the superior aspect of facet joint causing displacement of the inferior L5 nerve root after removal I didn't explore the L5 and S1 foramina coronary.angle proximal thickening noted be widely patent x-ray also explored lateral aspect of the space this was not felt to be herniated felt to be more spondylitic so I left the disc alone. Decrease in space was maintained Gelfoam was opened up the dura the muscle fascia proximally layers with after Vicryl skin is closed running 4 subcuticular Dermabond benzo and Steri-Strips and sterile dressing was applied and patient went covered room in stable condition. At the end the case all needle counts and sponge counts were correct.

## 2016-03-08 NOTE — Anesthesia Postprocedure Evaluation (Signed)
Anesthesia Post Note  Patient: Bryan [name]  Procedure(s) Performed: Procedure(s) (LRB): Laminectomy and Foraminotomy - Lumbar Five-Sacral One Right (Right)  Patient location during evaluation: PACU Anesthesia Type: General Level of consciousness: awake and alert Pain management: pain level controlled Vital Signs Assessment: post-procedure vital signs reviewed and stable Respiratory status: spontaneous breathing, nonlabored ventilation, respiratory function stable and patient connected to nasal cannula oxygen Cardiovascular status: blood pressure returned to baseline and stable Postop Assessment: no signs of nausea or vomiting Anesthetic complications: no    Last Vitals:  Vitals:   03/08/16 1305 03/08/16 1310  BP: (!) 179/68 (!) 182/69  Pulse: (!) 52 (!) 52  Resp: 16 12  Temp:      Last Pain:  Vitals:   03/08/16 0839  TempSrc: Oral                 Reginal Lutes

## 2016-03-08 NOTE — Discharge Summary (Signed)
  Physician Discharge Summary  Patient ID: Bryan [name] MRN: [id number] DOB/AGE: January 31, 1941 75 y.o.  Admit date: 03/08/2016 Discharge date: 03/08/2016  Admission Diagnoses:Right L5 radiculopathy from lumbar spinal stenosis lateral recess stenosis L5-S1  Discharge Diagnoses: Same Active Problems:   HNP (herniated nucleus pulposus with myelopathy), thoracic   Discharged Condition: good  Hospital Course: Patient is admitted hospital underwent decompressive laminectomy and foraminotomies of the L5 and S1 nerve roots in the right postoperatively patient did very well recovered before the floor was angling and voiding spontaneously tolerating a regular diet stable for discharge home.  Consults: Significant Diagnostic Studies: Treatments: Decompressive laminotomy L5-S1 right Discharge Exam: Blood pressure (!) 126/54, pulse 80, temperature 97.6 F (36.4 C), resp. rate 16, weight 87.1 kg (192 lb), SpO2 97 %. Strength out of 5 wound clean dry and intact  Disposition: Home    Follow-up Information    Jennetta Flood P, MD Follow up in 2 day(s).   Specialty:  Neurosurgery Contact information: [address] [place] [address] [phone]        Elaina Hoops, MD .   Specialty:  Neurosurgery Contact information: [address] [place] [address] [phone]           Signed: Elaina Hoops 03/08/2016, 4:59 PM

## 2016-03-08 NOTE — Discharge Instructions (Signed)
Wound Care ° °Keep the incision clean and dry remove the outer dressing in 2 days, leave the Steri-Strips intact. Wrap with Saran wrap for showers only °Do not put any creams, lotions, or ointments on incision. °Leave steri-strips on back.  They will fall off by themselves. ° °Activity °Walk each and every day, increasing distance each day. °No lifting greater than 5 lbs.  °No lifting no bending no twisting no driving or riding a car unless coming back and forth to see me. °If provided with back brace, wear when out of bed.  It is not necessary to wear brace in bed. °Diet °Resume your normal diet.  ° °Return to Work °Will be discussed at you follow up appointment. ° °Call Your Doctor If Any of These Occur °Redness, drainage, or swelling at the wound.  °Temperature greater than 101 degrees. °Severe pain not relieved by pain medication. °Incision starts to come apart. °Follow Up Appt °Call today for appointment in 1-2 weeks ([phone]) or for problems.  If you have any hardware placed in your spine, you will need an x-ray before your appointment. ° ° °

## 2016-03-08 NOTE — Anesthesia Preprocedure Evaluation (Addendum)
Anesthesia Evaluation  Patient identified by MRN, date of birth, ID band Patient awake    Reviewed: Allergy & Precautions, NPO status , Patient's Chart, lab work & pertinent test results  History of Anesthesia Complications (+) Emergence Delirium and history of anesthetic complications  Airway Mallampati: II  TM Distance: >3 FB Neck ROM: Full    Dental no notable dental hx.    Pulmonary neg pulmonary ROS, former smoker,    Pulmonary exam normal        Cardiovascular hypertension, + Peripheral Vascular Disease (ascending aortic aneurysm) and +CHF  Normal cardiovascular exam+ Valvular Problems/Murmurs (moderate AS) AS   The cavity size was mildly dilated. Systolic   function was normal. The estimated ejection fraction was in the   range of 50% to 55%. Wall motion was normal; there were no   regional wall motion abnormalities. Features are consistent with   a pseudonormal left ventricular filling pattern, with concomitant   abnormal relaxation and increased filling pressure (grade 2   diastolic dysfunction). - Aortic valve: A bioprosthesis was present. There was moderate   stenosis. Mean gradient (S): 20 mm Hg. Valve area (VTI): 1.24   cm^2. - Aorta: Aortic root dimension: 44 mm (ED). - Aortic root: The aortic root was moderately dilated. - Mitral valve: There was mild regurgitation. - Left atrium: The atrium was moderately dilated. - Right atrium: The atrium was mildly dilated. - Pulmonary arteries: Systolic pressure was moderately increased.   PA peak pressure: 53 mm Hg (s).  Cardiology cleared   Neuro/Psych PSYCHIATRIC DISORDERS Depression negative neurological ROS  negative psych ROS   GI/Hepatic negative GI ROS, Neg liver ROS,   Endo/Other  negative endocrine ROS  Renal/GU CRFRenal disease  negative genitourinary   Musculoskeletal negative musculoskeletal ROS (+) Arthritis ,   Abdominal   Peds negative  pediatric ROS (+)  Hematology negative hematology ROS (+) Blood dyscrasia, anemia ,   Anesthesia Other Findings   Reproductive/Obstetrics negative OB ROS                             Anesthesia Physical  Anesthesia Plan  ASA: III  Anesthesia Plan: General   Post-op Pain Management:    Induction: Intravenous  Airway Management Planned: Oral ETT  Additional Equipment:   Intra-op Plan:   Post-operative Plan: Extubation in OR  Informed Consent: I have reviewed the patients History and Physical, chart, labs and discussed the procedure including the risks, benefits and alternatives for the proposed anesthesia with the patient or authorized representative who has indicated his/her understanding and acceptance.   Dental advisory given  Plan Discussed with:   Anesthesia Plan Comments:         Anesthesia Quick Evaluation Explained the increased likelyhood that he will experience emergence delirium. Will minimize sedating medications. Patient voiced understanding and opted to proceed with general anesthesia.  

## 2016-03-09 ENCOUNTER — Encounter (HOSPITAL_COMMUNITY): Payer: Self-pay | Admitting: Neurosurgery

## 2016-03-19 ENCOUNTER — Telehealth: Payer: Self-pay | Admitting: Family Medicine

## 2016-03-19 NOTE — Telephone Encounter (Signed)
I called Pt and left a vm to schedule AWV. Thank you!

## 2016-03-26 DIAGNOSIS — M5416 Radiculopathy, lumbar region: Secondary | ICD-10-CM | POA: Diagnosis not present

## 2016-03-26 DIAGNOSIS — M533 Sacrococcygeal disorders, not elsewhere classified: Secondary | ICD-10-CM | POA: Diagnosis not present

## 2016-03-26 DIAGNOSIS — M791 Myalgia: Secondary | ICD-10-CM | POA: Diagnosis not present

## 2016-03-26 DIAGNOSIS — M47817 Spondylosis without myelopathy or radiculopathy, lumbosacral region: Secondary | ICD-10-CM | POA: Diagnosis not present

## 2016-03-29 ENCOUNTER — Other Ambulatory Visit: Payer: Self-pay | Admitting: *Deleted

## 2016-03-29 MED ORDER — FENOFIBRATE 160 MG PO TABS: 160.0000 mg | ORAL_TABLET | Freq: Every day | ORAL | 1 refills | Status: DC

## 2016-04-02 ENCOUNTER — Ambulatory Visit: Payer: Medicare Other

## 2016-04-21 DIAGNOSIS — M545 Low back pain: Secondary | ICD-10-CM | POA: Diagnosis not present

## 2016-04-26 DIAGNOSIS — M791 Myalgia: Secondary | ICD-10-CM | POA: Diagnosis not present

## 2016-04-26 DIAGNOSIS — M47817 Spondylosis without myelopathy or radiculopathy, lumbosacral region: Secondary | ICD-10-CM | POA: Diagnosis not present

## 2016-04-26 DIAGNOSIS — M5416 Radiculopathy, lumbar region: Secondary | ICD-10-CM | POA: Diagnosis not present

## 2016-04-26 DIAGNOSIS — M533 Sacrococcygeal disorders, not elsewhere classified: Secondary | ICD-10-CM | POA: Diagnosis not present

## 2016-04-27 DIAGNOSIS — M545 Low back pain: Secondary | ICD-10-CM | POA: Diagnosis not present

## 2016-04-29 DIAGNOSIS — M545 Low back pain: Secondary | ICD-10-CM | POA: Diagnosis not present

## 2016-04-30 DIAGNOSIS — M545 Low back pain: Secondary | ICD-10-CM | POA: Diagnosis not present

## 2016-04-30 DIAGNOSIS — G4700 Insomnia, unspecified: Secondary | ICD-10-CM | POA: Diagnosis not present

## 2016-04-30 DIAGNOSIS — G609 Hereditary and idiopathic neuropathy, unspecified: Secondary | ICD-10-CM | POA: Diagnosis not present

## 2016-04-30 DIAGNOSIS — R413 Other amnesia: Secondary | ICD-10-CM | POA: Diagnosis not present

## 2016-04-30 DIAGNOSIS — Z8679 Personal history of other diseases of the circulatory system: Secondary | ICD-10-CM | POA: Diagnosis not present

## 2016-05-04 DIAGNOSIS — M545 Low back pain: Secondary | ICD-10-CM | POA: Diagnosis not present

## 2016-05-05 ENCOUNTER — Encounter: Payer: Self-pay | Admitting: Cardiovascular Disease

## 2016-05-05 ENCOUNTER — Ambulatory Visit (INDEPENDENT_AMBULATORY_CARE_PROVIDER_SITE_OTHER): Payer: Medicare Other | Admitting: Cardiovascular Disease

## 2016-05-05 VITALS — BP 118/64 | HR 57 | Ht 72.0 in | Wt 188.5 lb

## 2016-05-05 DIAGNOSIS — I7121 Aneurysm of the ascending aorta, without rupture: Secondary | ICD-10-CM

## 2016-05-05 DIAGNOSIS — I6523 Occlusion and stenosis of bilateral carotid arteries: Secondary | ICD-10-CM

## 2016-05-05 DIAGNOSIS — I10 Essential (primary) hypertension: Secondary | ICD-10-CM

## 2016-05-05 DIAGNOSIS — Z952 Presence of prosthetic heart valve: Secondary | ICD-10-CM

## 2016-05-05 DIAGNOSIS — I2510 Atherosclerotic heart disease of native coronary artery without angina pectoris: Secondary | ICD-10-CM

## 2016-05-05 DIAGNOSIS — E782 Mixed hyperlipidemia: Secondary | ICD-10-CM

## 2016-05-05 DIAGNOSIS — R4189 Other symptoms and signs involving cognitive functions and awareness: Secondary | ICD-10-CM

## 2016-05-05 DIAGNOSIS — I480 Paroxysmal atrial fibrillation: Secondary | ICD-10-CM

## 2016-05-05 DIAGNOSIS — I712 Thoracic aortic aneurysm, without rupture: Secondary | ICD-10-CM

## 2016-05-05 NOTE — Patient Instructions (Signed)

## 2016-05-05 NOTE — Progress Notes (Signed)
Cardiology Office Note  Date:  05/05/2016   ID:  VONTAVIOUS KRONER, DOB [birth year], MRN [id number]  PCP:  [name], MD   Chief Complaint  Patient presents with  . other    6 month follow up. Meds reviewed by the pt. verbally. "doing well."     HPI:  Mr Tustin is a pleasant 75 year old gentleman with a history of Ascending aorta aneurysm, bicuspid aortic valve noted in 2009 with cardiac catheterization at that time showing mild to moderate CAD, who underwent aVR with bioprosthetic valve, and ascending aorta grafting in 2010, history of hypertension, hyperlipidemia, mild chronic renal insufficiency, total knee replacement in 2004, appendix rupture and gallbladder surgery in 2010, who presents for routine follow-up of his bioprosthetic aortic valve. Remote history of postoperative atrial fibrillation in 2010 He is a retired Software engineer  On today's visit we reports that he is doing well, no complaints recent echocardiogram from July 2017 reviewed with him in detail  Echo 11/2015:' Left ventricle:  The estimated ejection fraction was in the  range of 50% to 55%.  - Aortic valve: A bioprosthesis was present. There was mild stenosis. Mean gradient (S): 20 mm Hg. Valve area (VTI): 1.24 cm^2. - Aortic root: The aortic root was mild to moderately dilated.44 mm (ED). Ascending aorta mildly dilated - Pulmonary arteries:moderately increased.PA peak pressure: 53 mm Hg (S).   he does have some SOB with exertion, No sx at rest. Reports this has been a chronic issue. No change in symptoms Wife felt he was having some memory issues, reports is very mild  Aricept held for overactive dreams at night. Stop the medication 1 week ago He denies any chest pain concerning for angina  Lab work reviewed with him  Creatinine 1.6, BUN 30 total chol 122, LDL 55   foot surgery 10/13/2015, postoperative delirium, creatinine 1.9 well above his baseline October 2017 had recent back surgery lumbar spine    EKG on today's visit shows normal sinus rhythm with rate 57 bpm, no significant ST or T-wave changes, PVC  Other past medical history Echocardiogram June 2016 discussed with him showing normal LV function, valve gradient consistent with bioprosthetic valve, moderately dilated ascending aorta, stable, 4.4 cm  Echocardiogram July 2015 showing mild to moderate aortic valve stenosis, 4.5 cm ascending aorta. No prior records available  Notes indicate some degree of carotid disease though details are not available.  PMH:   has a past medical history of Allergy; Arrhythmia; Arthritis; Chronic back pain; Complication of anesthesia; Coronary artery disease; Depression; Glaucoma; H/O thoracic aortic aneurysm repair; History of being hospitalized; History of blood transfusion; History of chicken pox; Hyperlipidemia; Hypertension; Lumbar stenosis; Paroxysmal a-fib (HCC); and Valvular heart disease.  PSH:    Past Surgical History:  Procedure Laterality Date  . AORTIC VALVE REPLACEMENT  2007   [facility].  [place]  . APPENDECTOMY  2010  . CATARACT EXTRACTION W/ INTRAOCULAR LENS  IMPLANT, BILATERAL    . CHOLECYSTECTOMY  2010  . HAMMER TOE SURGERY Right 10/09/2015   Procedure: HAMMER TOE REPAIR WITH K-WIRE FIXATION RIGHT SECOND TOE;  Surgeon: Albertine Patricia, DPM;  Location: [facility];  Service: Podiatry;  Laterality: Right;  WITH LOCAL  . LUMBAR LAMINECTOMY/DECOMPRESSION MICRODISCECTOMY Right 03/08/2016   Procedure: Laminectomy and Foraminotomy - Lumbar Five-Sacral One Right;  Surgeon: Kary Kos, MD;  Location: [facility];  Service: Neurosurgery;  Laterality: Right;  Right  . MULTIPLE TOOTH EXTRACTIONS    . REPLACEMENT TOTAL KNEE Left 2003  .  THORACIC AORTIC ANEURYSM REPAIR  2010    Current Outpatient Prescriptions  Medication Sig Dispense Refill  . amLODipine (NORVASC) 5 MG tablet Take 1 tablet (5 mg total) by mouth daily. 90 tablet 3  . amoxicillin (AMOXIL) 500 MG capsule  Take 2,000 mg by mouth as needed (prior to dental procedures). Reported on 11/17/2015    . aspirin (GOODSENSE ASPIRIN) 325 MG tablet Take 325 mg by mouth daily.    . brimonidine (ALPHAGAN) 0.2 % ophthalmic solution Place 1 drop into both eyes every morning.     . cetirizine (ZYRTEC) 10 MG tablet Take 10 mg by mouth daily.    . cholecalciferol (VITAMIN D) 400 units TABS tablet Take 400 Units by mouth daily.    . fenofibrate 160 MG tablet Take 1 tablet (160 mg total) by mouth daily. 90 tablet 1  . fentaNYL (DURAGESIC - DOSED MCG/HR) 25 MCG/HR patch Apply one patch to skin every 3 days if tolerated (Patient taking differently: Place 25 mcg onto the skin every 3 (three) days. Apply one patch to skin every 3 days if tolerated) 10 patch 0  . FLUoxetine (PROZAC) 20 MG capsule Take 1 capsule (20 mg total) by mouth daily. 90 capsule 1  . folic acid (FOLVITE) 1 MG tablet Take 1 mg by mouth 2 (two) times daily.    . hydrALAZINE (APRESOLINE) 25 MG tablet Take 1 tablet (25 mg total) by mouth 3 (three) times daily. 90 tablet 3  . HYDROcodone-acetaminophen (NORCO/VICODIN) 5-325 MG tablet Limit  2-4 tablets by mouth per day for breakthrough pain while wearing fentanyl patch if tolerated (Patient taking differently: Take 1 tablet by mouth 4 (four) times daily. Limit  2-4 tablets by mouth per day for breakthrough pain while wearing fentanyl patch if tolerated) 120 tablet 0  . lisinopril (PRINIVIL,ZESTRIL) 20 MG tablet Take 1 tablet (20 mg total) by mouth every evening. 90 tablet 3  . predniSONE (DELTASONE) 5 MG tablet Take 5 mg by mouth daily as needed. Reported on 11/17/2015    . simvastatin (ZOCOR) 40 MG tablet Take 1 tablet (40 mg total) by mouth at bedtime. 90 tablet 3  . tamsulosin (FLOMAX) 0.4 MG CAPS capsule Take 1 capsule (0.4 mg total) by mouth daily. 90 capsule 1  . traZODone (DESYREL) 50 MG tablet Take 100 mg by mouth at bedtime as needed for sleep. Reported on 10/09/2015    . vitamin B-12 (CYANOCOBALAMIN)  1000 MCG tablet Take 1,000 mcg by mouth daily.    Marland Kitchen gabapentin (NEURONTIN) 800 MG tablet Limit 1 tablet by mouth 2-3 times per day if tolerated (Patient taking differently: Take 400 mg by mouth 3 (three) times daily. Limit 1 tablet by mouth 2-3 times per day if tolerated) 270 tablet 0  . hydroxychloroquine (PLAQUENIL) 200 MG tablet Take 200 mg by mouth daily.     Current Facility-Administered Medications  Medication Dose Route Frequency Provider Last Rate Last Dose  . fentaNYL (SUBLIMAZE) injection 100 mcg  100 mcg Intravenous Once Mohammed Kindle, MD      . lactated ringers infusion 1,000 mL  1,000 mL Intravenous Continuous Mohammed Kindle, MD 125 mL/hr at 01/19/16 1011 1,000 mL at 01/19/16 1011  . midazolam (VERSED) 5 MG/5ML injection 5 mg  5 mg Intravenous Once Mohammed Kindle, MD         Allergies:   Penicillins and Demerol [meperidine]   Social History:  The patient  reports that he quit smoking about 34 years ago. His smoking use included Cigarettes. He  has a 32.00 pack-year smoking history. He has never used smokeless tobacco. He reports that he drinks about 0.6 oz of alcohol per week . He reports that he does not use drugs.   Family History:   family history includes Asthma in his mother; Heart attack (age of onset: 76) in his father; Heart failure in his mother; Hypertension in his mother and sister.    Review of Systems: Review of Systems  Constitutional: Negative.   Respiratory: Negative.   Cardiovascular: Negative.   Gastrointestinal: Negative.   Musculoskeletal: Negative.        Gait instability  Neurological: Negative.   Psychiatric/Behavioral: Positive for memory loss.  All other systems reviewed and are negative.    PHYSICAL EXAM: VS:  BP 118/64 (BP Location: Left Arm, Patient Position: Sitting, Cuff Size: Normal)   Pulse (!) 57   Ht 6' (1.829 m)   Wt 188 lb 8 oz (85.5 kg)   BMI 25.57 kg/m  , BMI Body mass index is 25.57 kg/m. GEN: Well nourished, well developed, in  no acute distress  HEENT: normal  Neck: no JVD, carotid bruits, or masses Cardiac: RRR; 1 to 2+ SEM RSB,  No rubs, or gallops,no edema  Respiratory:  clear to auscultation bilaterally, normal work of breathing GI: soft, nontender, nondistended, + BS MS: no deformity or atrophy  Skin: warm and dry, no rash Neuro:  Strength and sensation are intact Psych: euthymic mood, full affect    Recent Labs: 07/09/2015: TSH 1.420 10/11/2015: ALT 26 03/03/2016: BUN 30; Creatinine, Ser 1.62; Hemoglobin 11.6; Platelets 114; Potassium 4.1; Sodium 138    Lipid Panel Lab Results  Component Value Date   CHOL 122 05/30/2015   HDL 54.20 05/30/2015   LDLCALC 55 05/30/2015   TRIG 63.0 05/30/2015      Wt Readings from Last 3 Encounters:  05/05/16 188 lb 8 oz (85.5 kg)  03/08/16 192 lb (87.1 kg)  03/03/16 192 lb 1.6 oz (87.1 kg)       ASSESSMENT AND PLAN:  Paroxysmal atrial fibrillation ([place]) - Plan: EKG 12-Lead Maintaining normal sinus rhythm Remote history of postoperative atrial fibrillation in 2010  Essential hypertension - Plan: EKG 12-Lead Blood pressure is well controlled on today's visit. No changes made to the medications.  CAD in native artery - Plan: EKG 12-Lead Currently with no symptoms of angina. No further workup at this time. Continue current medication regimen.  Aneurysm of ascending aorta, does post repair/grafting Visualized on echocardiogram, mild to moderately dilated 4.4 cm  Mixed hyperlipidemia Cholesterol is at goal on the current lipid regimen. No changes to the medications were made.  S/P AVR (aortic valve replacement) Aortic valve stable, echocardiogram several months ago showing mild aortic valve stenosis.  Consider repeat echocardiogram in one year to evaluate aorta  Cognitive decline Wife who presents with him today reports symptoms are very mild, recently stopped Aricept as he was having bad dreams Followed by neurology   Total encounter time more  than 25 minutes  Greater than 50% was spent in counseling and coordination of care with the patient   Disposition:   F/U  6 months   Orders Placed This Encounter  Procedures  . EKG 12-Lead     Signed, Esmond Plants, M.D., Ph.D. 05/05/2016  [place], [place]

## 2016-05-06 ENCOUNTER — Other Ambulatory Visit: Payer: Self-pay | Admitting: Nurse Practitioner

## 2016-05-06 DIAGNOSIS — M545 Low back pain: Secondary | ICD-10-CM | POA: Diagnosis not present

## 2016-05-10 ENCOUNTER — Emergency Department (HOSPITAL_COMMUNITY): Payer: Medicare Other

## 2016-05-10 ENCOUNTER — Encounter (HOSPITAL_COMMUNITY): Payer: Self-pay

## 2016-05-10 ENCOUNTER — Emergency Department (HOSPITAL_COMMUNITY)
Admission: EM | Admit: 2016-05-10 | Discharge: 2016-05-10 | Disposition: A | Discharge status: Home / Self Care | Payer: Medicare Other | Attending: Emergency Medicine | Admitting: Emergency Medicine

## 2016-05-10 DIAGNOSIS — M545 Low back pain, unspecified: Secondary | ICD-10-CM

## 2016-05-10 DIAGNOSIS — Z96652 Presence of left artificial knee joint: Secondary | ICD-10-CM | POA: Insufficient documentation

## 2016-05-10 DIAGNOSIS — G8918 Other acute postprocedural pain: Secondary | ICD-10-CM | POA: Diagnosis not present

## 2016-05-10 DIAGNOSIS — Z87891 Personal history of nicotine dependence: Secondary | ICD-10-CM | POA: Diagnosis not present

## 2016-05-10 DIAGNOSIS — N189 Chronic kidney disease, unspecified: Secondary | ICD-10-CM | POA: Diagnosis not present

## 2016-05-10 DIAGNOSIS — R200 Anesthesia of skin: Secondary | ICD-10-CM | POA: Diagnosis not present

## 2016-05-10 DIAGNOSIS — Z7982 Long term (current) use of aspirin: Secondary | ICD-10-CM | POA: Insufficient documentation

## 2016-05-10 DIAGNOSIS — I129 Hypertensive chronic kidney disease with stage 1 through stage 4 chronic kidney disease, or unspecified chronic kidney disease: Secondary | ICD-10-CM | POA: Insufficient documentation

## 2016-05-10 DIAGNOSIS — I2510 Atherosclerotic heart disease of native coronary artery without angina pectoris: Secondary | ICD-10-CM | POA: Diagnosis not present

## 2016-05-10 LAB — URINALYSIS, ROUTINE W REFLEX MICROSCOPIC
Bilirubin Urine: NEGATIVE
Glucose, UA: NEGATIVE mg/dL
Hgb urine dipstick: NEGATIVE
Ketones, ur: NEGATIVE mg/dL
Leukocytes, UA: NEGATIVE
Nitrite: NEGATIVE
Protein, ur: NEGATIVE mg/dL
Specific Gravity, Urine: 1.016 (ref 1.005–1.030)
pH: 5 (ref 5.0–8.0)

## 2016-05-10 LAB — CBC
HCT: 32.7 % — ABNORMAL LOW (ref 39.0–52.0)
Hemoglobin: 10.6 g/dL — ABNORMAL LOW (ref 13.0–17.0)
MCH: 30.5 pg (ref 26.0–34.0)
MCHC: 32.4 g/dL (ref 30.0–36.0)
MCV: 94.2 fL (ref 78.0–100.0)
Platelets: 121 10*3/uL — ABNORMAL LOW (ref 150–400)
RBC: 3.47 MIL/uL — ABNORMAL LOW (ref 4.22–5.81)
RDW: 13.3 % (ref 11.5–15.5)
WBC: 3.5 10*3/uL — ABNORMAL LOW (ref 4.0–10.5)

## 2016-05-10 LAB — BASIC METABOLIC PANEL
Anion gap: 8 (ref 5–15)
BUN: 33 mg/dL — ABNORMAL HIGH (ref 6–20)
CO2: 27 mmol/L (ref 22–32)
Calcium: 9.6 mg/dL (ref 8.9–10.3)
Chloride: 105 mmol/L (ref 101–111)
Creatinine, Ser: 1.35 mg/dL — ABNORMAL HIGH (ref 0.61–1.24)
GFR calc Af Amer: 57 mL/min — ABNORMAL LOW (ref >60)
GFR calc non Af Amer: 49 mL/min — ABNORMAL LOW (ref >60)
Glucose, Bld: 97 mg/dL (ref 65–99)
Potassium: 4.7 mmol/L (ref 3.5–5.1)
Sodium: 140 mmol/L (ref 135–145)

## 2016-05-10 LAB — SEDIMENTATION RATE: Sed Rate: 7 mm/hr (ref 0–16)

## 2016-05-10 MED ORDER — GADOBENATE DIMEGLUMINE 529 MG/ML IV SOLN
18.0000 mL | Freq: Once | INTRAVENOUS | Status: AC | PRN
  Administered 2016-05-10: 18 mL via INTRAVENOUS

## 2016-05-10 MED ORDER — OXYCODONE-ACETAMINOPHEN 5-325 MG PO TABS
2.0000 | ORAL_TABLET | Freq: Once | ORAL | Status: AC
  Administered 2016-05-10: 2 via ORAL
  Filled 2016-05-10: qty 2

## 2016-05-10 NOTE — Discharge Instructions (Signed)
Follow-up with Dr. Saintclair Halsted for the results and further plan.

## 2016-05-10 NOTE — ED Notes (Signed)
Attempted IV x 2 without success. Patient tolerated well. 

## 2016-05-10 NOTE — ED Provider Notes (Signed)
  Physical Exam  BP 147/74   Pulse (!) 56   Temp 97.8 F (36.6 C) (Oral)   Resp 16   SpO2 97%   Physical Exam  ED Course  Procedures  MDM Received patient in signout. MRI reviewed and was improved but not resolved postoperative changes. Will follow with neurosurgery as planned       Davonna Belling, MD 05/13/16 1520

## 2016-05-10 NOTE — Consult Note (Signed)
Reason for Consult: Back pain Referring Physician: [facility] department Dr. Elder Negus is an 75 y.o. male.  HPI: 75 year old gentleman is 2 months out from a 2 level laminectomy decompression on the right at L3-4 and L4-5 patient initially did very well for the first month we put him in physical therapy and since he started physical therapy for 3 weeks ago as several resources and back pain. He is also getting some pain going down his right leg to his ankle doesn't go into his big toe what it did preoperatively these ocular and is much hip pain as he did preoperatively but the pain going down the leg is gotten to be worse and required him to be utilizing a cane when ambulating. Patient presented to the ER and is being evaluated. Patient denies any bowel or bladder difficulties and has a left leg symptoms. Denies any fevers or chills nausea vomiting.  Past Medical History:  Diagnosis Date  . Allergy   . Arrhythmia   . 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 Chronic back pain   . Complication of anesthesia    Memory loss 09/2015  . Coronary artery disease   . Depression   . Glaucoma   . H/O thoracic aortic aneurysm repair   . History of being hospitalized    memory lose kidney funtion down blood pressure up  . History of blood transfusion   . History of chicken pox   . Hyperlipidemia   . Hypertension   . Lumbar stenosis   . Paroxysmal a-fib ([place])   . Valvular heart disease     Past Surgical History:  Procedure Laterality Date  . AORTIC VALVE REPLACEMENT  2007   [facility].  [place], [place]  . APPENDECTOMY  2010  . CATARACT EXTRACTION W/ INTRAOCULAR LENS  IMPLANT, BILATERAL    . CHOLECYSTECTOMY  2010  . HAMMER TOE SURGERY Right 10/09/2015   Procedure: HAMMER TOE REPAIR WITH K-WIRE FIXATION RIGHT SECOND TOE;  Surgeon: Albertine Patricia, DPM;  Location: [place];  Service: Podiatry;  Laterality: Right;  WITH LOCAL  . LUMBAR  LAMINECTOMY/DECOMPRESSION MICRODISCECTOMY Right 03/08/2016   Procedure: Laminectomy and Foraminotomy - Lumbar Five-Sacral One Right;  Surgeon: Kary Kos, MD;  Location: [facility];  Service: Neurosurgery;  Laterality: Right;  Right  . MULTIPLE TOOTH EXTRACTIONS    . REPLACEMENT TOTAL KNEE Left 2003  . THORACIC AORTIC ANEURYSM REPAIR  2010    Family History  Problem Relation Age of Onset  . Heart failure Mother   . Hypertension Mother   . Asthma Mother   . Heart attack Father 34    MI  . Hypertension Sister     Social History:  reports that he quit smoking about 34 years ago. His smoking use included Cigarettes. He has a 32.00 pack-year smoking history. He has never used smokeless tobacco. He reports that he drinks about 0.6 oz of alcohol per week . He reports that he does not use drugs.  Allergies:  Allergies  Allergen Reactions  . Penicillins Hives and Swelling    SWELLING REACTION UNSPECIFIED PATIENT HAS TAKEN AMOXICILLIN ON MED HX FROM [facility] Has patient had a PCN reaction causing immediate rash, facial/tongue/throat swelling, SOB or lightheadedness with hypotension: Yes Has patient had a PCN reaction causing severe rash involving mucus membranes or skin necrosis: No Has patient had a PCN reaction that required hospitalization No Has patient had a PCN reaction occurring within  the last 10 years: No If all of the above answers are "NO", then may proceed with  . Demerol [Meperidine] Hives and Nausea And Vomiting    Medications: I have reviewed the patient's current medications.  Results for orders placed or performed during the hospital encounter of 05/10/16 (from the past 48 hour(s))  CBC     Status: Abnormal   Collection Time: 05/10/16  3:44 PM  Result Value Ref Range   WBC 3.5 (L) 4.0 - 10.5 K/uL   RBC 3.47 (L) 4.22 - 5.81 MIL/uL   Hemoglobin 10.6 (L) 13.0 - 17.0 g/dL   HCT 32.7 (L) 39.0 - 52.0 %   MCV 94.2 78.0 - 100.0 fL   MCH 30.5 26.0 - 34.0 pg   MCHC 32.4 30.0 - 36.0  g/dL   RDW 13.3 11.5 - 15.5 %   Platelets 121 (L) 150 - 400 K/uL  Basic metabolic panel     Status: Abnormal   Collection Time: 05/10/16  3:44 PM  Result Value Ref Range   Sodium 140 135 - 145 mmol/L   Potassium 4.7 3.5 - 5.1 mmol/L   Chloride 105 101 - 111 mmol/L   CO2 27 22 - 32 mmol/L   Glucose, Bld 97 65 - 99 mg/dL   BUN 33 (H) 6 - 20 mg/dL   Creatinine, Ser 1.35 (H) 0.61 - 1.24 mg/dL   Calcium 9.6 8.9 - 10.3 mg/dL   GFR calc non Af Amer 49 (L) >60 mL/min   GFR calc Af Amer 57 (L) >60 mL/min    Comment: (NOTE) The eGFR has been calculated using the CKD EPI equation. This calculation has not been validated in all clinical situations. eGFR's persistently <60 mL/min signify possible Chronic Kidney Disease.    Anion gap 8 5 - 15  Urinalysis, Routine w reflex microscopic     Status: None   Collection Time: 05/10/16  4:27 PM  Result Value Ref Range   Color, Urine YELLOW YELLOW   APPearance CLEAR CLEAR   Specific Gravity, Urine 1.016 1.005 - 1.030   pH 5.0 5.0 - 8.0   Glucose, UA NEGATIVE NEGATIVE mg/dL   Hgb urine dipstick NEGATIVE NEGATIVE   Bilirubin Urine NEGATIVE NEGATIVE   Ketones, ur NEGATIVE NEGATIVE mg/dL   Protein, ur NEGATIVE NEGATIVE mg/dL   Nitrite NEGATIVE NEGATIVE   Leukocytes, UA NEGATIVE NEGATIVE    Dg Lumbar Spine 2-3 Views  Result Date: 05/10/2016 CLINICAL DATA:  Bilateral leg weakness and numbness. History of lumbar laminectomy 03/08/2016. EXAM: LUMBAR SPINE - 2-3 VIEW COMPARISON:  MRI of the lumbar spine 01/02/2016 and CT abdomen and pelvis 10/11/2015. FINDINGS: Mild convex right scoliosis is again seen. No fracture or malalignment. Loss of disc space height L5-S1 noted. Facet degenerative changes also seen at L5-S1. Aortic atherosclerosis is identified. IMPRESSION: No acute abnormality. Degenerative disease L5-S1. Atherosclerosis. Electronically Signed   By: Inge Rise M.D.   On: 05/10/2016 16:22    Review of Systems  Musculoskeletal: Positive  for back pain, joint pain and myalgias.  All other systems reviewed and are negative.  Blood pressure 176/73, pulse (!) 50, temperature 97.8 F (36.6 C), temperature source Oral, resp. rate 14, SpO2 94 %. Physical Exam  Constitutional: He appears well-developed.  Neurological: He has normal strength. GCS eye subscore is 4. GCS verbal subscore is 5. GCS motor subscore is 6.  Strength is 5 over 5 iliopsoas, quads, hamstring, gastroc, and stability, DHL.    Assessment/Plan: 75 year old woman with worsening back pain  2 months postop recommend continuing with the MRI scan with and without contrast recommend CBC and sedimentation rate C-reactive protein also to help rule out infection. If the MRI is a Patient to be discharged if concerning please call the on-call doctor.  Finnis Colee P 05/10/2016, 5:17 PM

## 2016-05-10 NOTE — ED Triage Notes (Signed)
Pt reports he had lumbar laminectomy on 10/11 and has had gradually worsening lower back pain. He reports he is now unable to get out of bed or preform daily activities without his cane.

## 2016-05-10 NOTE — ED Provider Notes (Signed)
[facility] DEPT Provider Note   CSN: [id number] Arrival date & time: 05/10/16  1146     History   Chief Complaint Chief Complaint  Patient presents with  . Back Pain  . Post-op Problem    HPI Bryan [name] is a 75 y.o. male.  HPI   75 yo M with PMHx of AFib, HTN, HLD, RA on immune suppressants, s/p recent lumbar discectomy in October here with worsening back pain. Pt has had worsening lower back/lumbar pain radiating to his leg for past several weeks. He was  Reportedly doing well for approx 2 weeks after surgery but has since has progressively worsening aching, throbbing lower back pain. Pain radiates down his right leg. He has some weakness 2/2 pain but denies any persistent weakness or numbness. No loss of bowel or bladder function. No fevers or chills.  Past Medical History:  Diagnosis Date  . Allergy   . Arrhythmia   . 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 Chronic back pain   . Complication of anesthesia    Memory loss 09/2015  . Coronary artery disease   . Depression   . Glaucoma   . H/O thoracic aortic aneurysm repair   . History of being hospitalized    memory lose kidney funtion down blood pressure up  . History of blood transfusion   . History of chicken pox   . Hyperlipidemia   . Hypertension   . Lumbar stenosis   . Paroxysmal a-fib ([place])   . Valvular heart disease     Patient Active Problem List   Diagnosis Date Noted  . HNP (herniated nucleus pulposus with myelopathy), thoracic 03/08/2016  . Rectal nodule 02/23/2016  . DDD (degenerative disc disease), lumbar 01/02/2016  . Lumbar radiculopathy 01/02/2016  . Facet syndrome, lumbar 01/02/2016  . Greater trochanteric bursitis 11/28/2015  . Acute renal failure ([place]) 11/06/2015  . Dehydration 10/17/2015  . Altered mental status 10/11/2015  . IBS (irritable bowel syndrome) 07/15/2015  . Erectile dysfunction of organic origin 07/15/2015  . Carotid stenosis 05/06/2015    . 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 Chronic kidney disease 02/17/2015  . 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  . 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 Rheumatoid arthritis ([place]) 10/01/2014  . DJD (degenerative joint disease), cervical 10/01/2014  . DJD (degenerative joint disease), lumbar 10/01/2014  . Sacroiliac joint disease 10/01/2014  . Dorsopathy 10/01/2014  . DDD (degenerative disc disease), cervical 10/01/2014  . Lumbar and sacral osteoarthritis 10/01/2014  . Amnesia 06/20/2014  . Mechanical and motor problems with internal organs 06/20/2014  . Transient alteration of awareness 06/19/2014  . Anemia due to other cause 06/19/2014  . Absolute anemia 06/19/2014  . Aneurysm, ascending aorta ([place]) 05/27/2014  . Aneurysm of thoracic aorta ([place]) 05/27/2014  . Thoracic aortic aneurysm without rupture ([place]) 05/27/2014  . Cognitive decline 04/01/2014  . Spinal stenosis of lumbar region with radiculopathy 01/30/2014  . Vitamin D deficiency 01/30/2014  . HLD (hyperlipidemia) 01/30/2014  . Back ache 01/30/2014  . Lumbar canal stenosis 01/30/2014  . Paroxysmal atrial fibrillation ([place]) 11/27/2013  . S/P AVR (aortic valve replacement) 11/27/2013  . History of ascending aorta repair 11/27/2013  . Hyperlipidemia 11/27/2013  . Essential hypertension 11/27/2013  . Bicuspid aortic valve 11/27/2013  . CAD in native artery 11/27/2013  . Aortic regurgitation, congenital 11/27/2013  . History of open heart surgery 11/27/2013  . Postprocedural state 11/27/2013  .  History of cardiovascular surgery 11/27/2013    Past Surgical History:  Procedure Laterality Date  . AORTIC VALVE REPLACEMENT  2007   [facility].  [place]  . APPENDECTOMY  2010  . CATARACT EXTRACTION W/ INTRAOCULAR LENS  IMPLANT, BILATERAL    . CHOLECYSTECTOMY  2010  . HAMMER TOE SURGERY Right 10/09/2015   Procedure: HAMMER TOE REPAIR WITH K-WIRE  FIXATION RIGHT SECOND TOE;  Surgeon: Albertine Patricia, DPM;  Location: [facility];  Service: Podiatry;  Laterality: Right;  WITH LOCAL  . LUMBAR LAMINECTOMY/DECOMPRESSION MICRODISCECTOMY Right 03/08/2016   Procedure: Laminectomy and Foraminotomy - Lumbar Five-Sacral One Right;  Surgeon: Kary Kos, MD;  Location: [facility];  Service: Neurosurgery;  Laterality: Right;  Right  . MULTIPLE TOOTH EXTRACTIONS    . REPLACEMENT TOTAL KNEE Left 2003  . THORACIC AORTIC ANEURYSM REPAIR  2010       Home Medications    Prior to Admission medications   Medication Sig Start Date End Date Taking? Authorizing Provider  amLODipine (NORVASC) 5 MG tablet Take 1 tablet (5 mg total) by mouth daily. 03/04/16   [name], MD  amoxicillin (AMOXIL) 500 MG capsule Take 2,000 mg by mouth as needed (prior to dental procedures). Reported on 11/17/2015    Historical Provider, MD  aspirin (GOODSENSE ASPIRIN) 325 MG tablet Take 325 mg by mouth daily.    Historical Provider, MD  brimonidine (ALPHAGAN) 0.2 % ophthalmic solution Place 1 drop into both eyes every morning.  10/28/15   Historical Provider, MD  cetirizine (ZYRTEC) 10 MG tablet Take 10 mg by mouth daily.    Historical Provider, MD  cholecalciferol (VITAMIN D) 400 units TABS tablet Take 400 Units by mouth daily.    Historical Provider, MD  fenofibrate 160 MG tablet Take 1 tablet (160 mg total) by mouth daily. 03/29/16   Minna Merritts, MD  fentaNYL (DURAGESIC - DOSED MCG/HR) 25 MCG/HR patch Apply one patch to skin every 3 days if tolerated Patient taking differently: Place 25 mcg onto the skin every 3 (three) days. Apply one patch to skin every 3 days if tolerated 01/28/16   Mohammed Kindle, MD  FLUoxetine (PROZAC) 20 MG capsule Take 1 capsule (20 mg total) by mouth daily. 05/09/15   Rubbie Battiest, NP  folic acid (FOLVITE) 1 MG tablet Take 1 mg by mouth 2 (two) times daily.    Historical Provider, MD  gabapentin (NEURONTIN) 800 MG tablet Limit 1 tablet by  mouth 2-3 times per day if tolerated Patient taking differently: Take 400 mg by mouth 3 (three) times daily. Limit 1 tablet by mouth 2-3 times per day if tolerated 01/28/16   Mohammed Kindle, MD  hydrALAZINE (APRESOLINE) 25 MG tablet Take 1 tablet (25 mg total) by mouth 3 (three) times daily. 02/16/16   Minna Merritts, MD  HYDROcodone-acetaminophen (NORCO/VICODIN) 5-325 MG tablet Limit  2-4 tablets by mouth per day for breakthrough pain while wearing fentanyl patch if tolerated Patient taking differently: Take 1 tablet by mouth 4 (four) times daily. Limit  2-4 tablets by mouth per day for breakthrough pain while wearing fentanyl patch if tolerated 01/28/16   Mohammed Kindle, MD  hydroxychloroquine (PLAQUENIL) 200 MG tablet Take 200 mg by mouth daily.    Historical Provider, MD  lisinopril (PRINIVIL,ZESTRIL) 20 MG tablet Take 1 tablet (20 mg total) by mouth every evening. 03/08/16   Minna Merritts, MD  predniSONE (DELTASONE) 5 MG tablet Take 5 mg by mouth daily as needed. Reported  on 11/17/2015    Historical Provider, MD  simvastatin (ZOCOR) 40 MG tablet Take 1 tablet (40 mg total) by mouth at bedtime. 12/15/15   Minna Merritts, MD  tamsulosin (FLOMAX) 0.4 MG CAPS capsule TAKE 1 CAPSULE BY MOUTH EVERY DAY 05/06/16   [name], MD  traZODone (DESYREL) 50 MG tablet Take 100 mg by mouth at bedtime as needed for sleep. Reported on 10/09/2015 04/15/15   Historical Provider, MD  vitamin B-12 (CYANOCOBALAMIN) 1000 MCG tablet Take 1,000 mcg by mouth daily.    Historical Provider, MD    Family History Family History  Problem Relation Age of Onset  . Heart failure Mother   . Hypertension Mother   . Asthma Mother   . Heart attack Father 69    MI  . Hypertension Sister     Social History Social History  Substance Use Topics  . Smoking status: Former Smoker    Packs/day: 1.00    Years: 32.00    Types: Cigarettes    Quit date: 07/05/1981  . Smokeless tobacco: Never Used  . Alcohol use 0.6 oz/week     1 Glasses of wine per week     Comment: occasional; glass of wine     Allergies   Penicillins and Demerol [meperidine]   Review of Systems Review of Systems  Constitutional: Negative for chills, fatigue and fever.  HENT: Negative for congestion and rhinorrhea.   Eyes: Negative for visual disturbance.  Respiratory: Negative for cough, shortness of breath and wheezing.   Cardiovascular: Negative for chest pain and leg swelling.  Gastrointestinal: Negative for abdominal pain, diarrhea, nausea and vomiting.  Genitourinary: Negative for dysuria and flank pain.  Musculoskeletal: Positive for back pain and gait problem (due to back pain). Negative for neck pain and neck stiffness.  Skin: Negative for rash and wound.  Allergic/Immunologic: Negative for immunocompromised state.  Neurological: Negative for syncope, weakness and headaches.  All other systems reviewed and are negative.    Physical Exam Updated Vital Signs BP 176/73 (BP Location: Right Arm)   Pulse (!) 50   Temp 97.8 F (36.6 C) (Oral)   Resp 14   SpO2 94%   Physical Exam  Constitutional: He is oriented to person, place, and time. He appears well-developed and well-nourished. No distress.  HENT:  Head: Normocephalic and atraumatic.  Eyes: Conjunctivae are normal.  Neck: Neck supple.  Cardiovascular: Normal rate, regular rhythm and normal heart sounds.  Exam reveals no friction rub.   No murmur heard. Pulmonary/Chest: Effort normal and breath sounds normal. No respiratory distress. He has no wheezes. He has no rales.  Abdominal: He exhibits no distension.  Musculoskeletal: He exhibits no edema.  Neurological: He is alert and oriented to person, place, and time. He exhibits normal muscle tone.  Skin: Skin is warm. Capillary refill takes less than 2 seconds.  Psychiatric: He has a normal mood and affect.  Nursing note and vitals reviewed.   Spine Exam: Inspection/Palpation: Moderate TTP over lower back  inferior to surgical incision, which appears c/d/i. No erythema or induration. No fluctuance Strength: 5/5 throughout LE bilaterally (hip flexion/extension, adduction/abduction; knee flexion/extension; foot dorsiflexion/plantarflexion, inversion/eversion; great toe inversion) Sensation: Intact to light touch in proximal and distal LE bilaterally Reflexes: 2+ quadriceps and achilles reflexes   ED Treatments / Results  Labs (all labs ordered are listed, but only abnormal results are displayed) Labs Reviewed  CBC - Abnormal; Notable for the following:       Result Value  WBC 3.5 (*)    RBC 3.47 (*)    Hemoglobin 10.6 (*)    HCT 32.7 (*)    Platelets 121 (*)    All other components within normal limits  URINALYSIS, ROUTINE W REFLEX MICROSCOPIC  BASIC METABOLIC PANEL  SEDIMENTATION RATE  C-REACTIVE PROTEIN    EKG  EKG Interpretation None       Radiology Dg Lumbar Spine 2-3 Views  Result Date: 05/10/2016 CLINICAL DATA:  Bilateral leg weakness and numbness. History of lumbar laminectomy 03/08/2016. EXAM: LUMBAR SPINE - 2-3 VIEW COMPARISON:  MRI of the lumbar spine 01/02/2016 and CT abdomen and pelvis 10/11/2015. FINDINGS: Mild convex right scoliosis is again seen. No fracture or malalignment. Loss of disc space height L5-S1 noted. Facet degenerative changes also seen at L5-S1. Aortic atherosclerosis is identified. IMPRESSION: No acute abnormality. Degenerative disease L5-S1. Atherosclerosis. Electronically Signed   By: Inge Rise M.D.   On: 05/10/2016 16:22    Procedures Procedures (including critical care time)  Medications Ordered in ED Medications  oxyCODONE-acetaminophen (PERCOCET/ROXICET) 5-325 MG per tablet 2 tablet (2 tablets Oral Given 05/10/16 1542)     Initial Impression / Assessment and Plan / ED Course  I have reviewed the triage vital signs and the nursing notes.  Pertinent labs & imaging results that were available during my care of the patient were  reviewed by me and considered in my medical decision making (see chart for details).  Clinical Course     75 yo M with PMHx as above including RA on plaquenil here with worsening lower back pain s/p lumbar lamniectomy/decompession with Dr. Saintclair Halsted on 10/16 here with worsening LBP and intermittent weakness. On arrival, VSS and WNL. Initial DDx includes post-op pain with possible new/worsening radiculopathy, deconditioning, but must also consider possible post-op infection though no fever or signs fo systemic illness. Must also consider post-op epidural hematoma though timeline less c/w this. D/w Dr. Saintclair Halsted - will obtain labs and MR, dispo accordingly.  Final Clinical Impressions(s) / ED Diagnoses   Final diagnoses:  Lower back pain      Duffy Bruce, MD 05/10/16 [phone]

## 2016-05-14 ENCOUNTER — Other Ambulatory Visit: Payer: Self-pay | Admitting: Neurosurgery

## 2016-05-14 DIAGNOSIS — M5416 Radiculopathy, lumbar region: Secondary | ICD-10-CM

## 2016-05-25 ENCOUNTER — Other Ambulatory Visit: Payer: Self-pay | Admitting: Pain Medicine

## 2016-05-25 DIAGNOSIS — M47817 Spondylosis without myelopathy or radiculopathy, lumbosacral region: Secondary | ICD-10-CM | POA: Diagnosis not present

## 2016-05-25 DIAGNOSIS — M791 Myalgia: Secondary | ICD-10-CM | POA: Diagnosis not present

## 2016-05-25 DIAGNOSIS — M533 Sacrococcygeal disorders, not elsewhere classified: Secondary | ICD-10-CM | POA: Diagnosis not present

## 2016-05-25 DIAGNOSIS — M5416 Radiculopathy, lumbar region: Secondary | ICD-10-CM | POA: Diagnosis not present

## 2016-05-26 ENCOUNTER — Other Ambulatory Visit: Payer: Self-pay | Admitting: Nurse Practitioner

## 2016-05-26 ENCOUNTER — Ambulatory Visit
Admission: RE | Admit: 2016-05-26 | Discharge: 2016-05-26 | Disposition: A | Discharge status: Home / Self Care | Payer: Medicare Other | Source: Ambulatory Visit | Attending: Neurosurgery | Admitting: Neurosurgery

## 2016-05-26 DIAGNOSIS — M5416 Radiculopathy, lumbar region: Secondary | ICD-10-CM | POA: Diagnosis not present

## 2016-05-26 MED ORDER — METHYLPREDNISOLONE ACETATE 40 MG/ML INJ SUSP (RADIOLOG
120.0000 mg | Freq: Once | INTRAMUSCULAR | Status: AC
  Administered 2016-05-26: 120 mg via EPIDURAL

## 2016-05-26 MED ORDER — IOPAMIDOL (ISOVUE-M 200) INJECTION 41%
1.0000 mL | Freq: Once | INTRAMUSCULAR | Status: AC
  Administered 2016-05-26: 1 mL via EPIDURAL

## 2016-05-26 NOTE — Discharge Instructions (Signed)

## 2016-06-01 ENCOUNTER — Other Ambulatory Visit: Payer: Self-pay | Admitting: Neurosurgery

## 2016-06-01 DIAGNOSIS — M5137 Other intervertebral disc degeneration, lumbosacral region: Secondary | ICD-10-CM

## 2016-06-02 ENCOUNTER — Ambulatory Visit (INDEPENDENT_AMBULATORY_CARE_PROVIDER_SITE_OTHER): Payer: Medicare Other | Admitting: Family Medicine

## 2016-06-02 ENCOUNTER — Ambulatory Visit: Payer: Medicare Other | Admitting: Family Medicine

## 2016-06-02 ENCOUNTER — Encounter: Payer: Self-pay | Admitting: Family Medicine

## 2016-06-02 VITALS — BP 110/62 | HR 62 | Temp 98.0°F | Wt 187.8 lb

## 2016-06-02 DIAGNOSIS — R252 Cramp and spasm: Secondary | ICD-10-CM

## 2016-06-02 DIAGNOSIS — K582 Mixed irritable bowel syndrome: Secondary | ICD-10-CM

## 2016-06-02 DIAGNOSIS — M5136 Other intervertebral disc degeneration, lumbar region: Secondary | ICD-10-CM

## 2016-06-02 DIAGNOSIS — K6289 Other specified diseases of anus and rectum: Secondary | ICD-10-CM | POA: Diagnosis not present

## 2016-06-02 DIAGNOSIS — D649 Anemia, unspecified: Secondary | ICD-10-CM | POA: Diagnosis not present

## 2016-06-02 LAB — CBC
HCT: 29.5 % — ABNORMAL LOW (ref 39.0–52.0)
Hemoglobin: 10 g/dL — ABNORMAL LOW (ref 13.0–17.0)
MCHC: 33.8 g/dL (ref 30.0–36.0)
MCV: 92.3 fl (ref 78.0–100.0)
Platelets: 155 10*3/uL (ref 150.0–400.0)
RBC: 3.19 Mil/uL — ABNORMAL LOW (ref 4.22–5.81)
RDW: 13.5 % (ref 11.5–15.5)
WBC: 4.2 10*3/uL (ref 4.0–10.5)

## 2016-06-02 LAB — MAGNESIUM: Magnesium: 1.9 mg/dL (ref 1.5–2.5)

## 2016-06-02 NOTE — Assessment & Plan Note (Addendum)
He is doing fairly well with this following epidural injection. He follows up with them in another week or 2 for another epidural injection. He'll keep that appointment and monitor for worsening symptoms. Does report some chronic lower extremity cramps for which he has been taking magnesium. Recent electrolytes appear to be in the normal range. We'll check a magnesium as he has been supplementing with this.

## 2016-06-02 NOTE — Assessment & Plan Note (Signed)
Encouraged him to contact general surgery to get this evaluated. He stated he would once his back is sorted out.

## 2016-06-02 NOTE — Assessment & Plan Note (Addendum)
Continues to have low hemoglobin. FOBT negative today. We will get stool cards to complete at home given blood when wiping rectum after having a constipated bowel movement We'll recheck today and consider further lab work if still low.

## 2016-06-02 NOTE — Progress Notes (Signed)
Pre visit review using our clinic review tool, if applicable. No additional management support is needed unless otherwise documented below in the visit note. 

## 2016-06-02 NOTE — Patient Instructions (Signed)
Nice to see you. Please keep your follow-up with the neurosurgeon for epidural. We'll check some lab work and call with the results. Would recommend following up with general surgery for your rectal nodule. Please contact them to set up an appointment.

## 2016-06-02 NOTE — Assessment & Plan Note (Signed)
Intermittent diarrhea and constipation. Daily bowel movements. Discussed stool softeners with constipation. Continue to monitor.

## 2016-06-02 NOTE — Progress Notes (Signed)
[name], MD Phone: [phone]  Bryan [name] is a 76 y.o. male who presents today for follow-up.  Back pain:This is somewhat better. Had laminectomy and then had some worsening pain and was seen in the emergency room. Had MRI that revealed somewhat improved postsurgical changes. Has seen neurosurgery several times. They recommended either fusion versus epidurals. Patient has undergone one epidural so far with good benefit in his low back though continues to have sciatica-like pain in his leg. He notes chronic neuropathy though no new numbness. No weakness. No loss of bowel or bladder function. He does occasionally note cramps in his lower legs. Has been going on for a long time. Started taking magnesium recently for this.  IBS: Alternating constipation and diarrhea. Has a bowel movement daily. Does note they are occasionally hard. Does get bowel urgency and then has diarrhea. No blood in his stool other than when he is constipated and then he has a large bowel movement and wipes and there is blood. He cannot remember his last colonoscopy.  Has not seen general surgery for his rectal nodule yet. He is going to contact them to set up an appointment.  Anemia: Seen on recent lab work. Could be postsurgical though has been trending down over the last 7-8 months. No lightheadedness, palpitations, or shortness of breath. Minimal bleeding when he is constipated though no other bleeding.  PMH: Former smoker   ROS see history of present illness  Objective  Physical Exam Vitals:   06/02/16 1403  BP: 110/62  Pulse: 62  Temp: 98 F (36.7 C)    BP Readings from Last 3 Encounters:  06/02/16 110/62  05/26/16 139/64  05/10/16 169/81   Wt Readings from Last 3 Encounters:  06/02/16 187 lb 12.8 oz (85.2 kg)  05/05/16 188 lb 8 oz (85.5 kg)  03/08/16 192 lb (87.1 kg)    Physical Exam  Constitutional: No distress.  HENT:  Head: Normocephalic and atraumatic.  Cardiovascular:  Normal rate, regular rhythm and normal heart sounds.   Pulmonary/Chest: Effort normal and breath sounds normal.  Abdominal: Soft. Bowel sounds are normal. He exhibits no distension. There is no tenderness.  Musculoskeletal:  No midline spine tenderness, no midline spine step-off, no muscular back tenderness, there is a well-healed surgical scar in the lumbar spine  Neurological: He is alert. Gait normal.  5 out of 5 strength bilateral quads, hamstrings, plantar flexion, and dorsiflexion, sensation light touch intact bilateral lower extremities  Skin: Skin is warm and dry. He is not diaphoretic.     Assessment/Plan: Please see individual problem list.  IBS (irritable bowel syndrome) Intermittent diarrhea and constipation. Daily bowel movements. Discussed stool softeners with constipation. Continue to monitor.  Rectal nodule Encouraged him to contact general surgery to get this evaluated. He stated he would once his back is sorted out.  Anemia Continues to have low hemoglobin. FOBT negative today. We will get stool cards to complete at home given blood when wiping rectum after having a constipated bowel movement We'll recheck today and consider further lab work if still low.  DDD (degenerative disc disease), lumbar He is doing fairly well with this following epidural injection. He follows up with them in another week or 2 for another epidural injection. He'll keep that appointment and monitor for worsening symptoms. Does report some chronic lower extremity cramps for which he has been taking magnesium. Recent electrolytes appear to be in the normal range. We'll check a magnesium as he has been supplementing with this.  Orders Placed This Encounter  Procedures  . CBC  . Magnesium    [name], MD [place]

## 2016-06-03 ENCOUNTER — Other Ambulatory Visit: Payer: Self-pay | Admitting: Family Medicine

## 2016-06-03 ENCOUNTER — Telehealth: Payer: Self-pay | Admitting: Radiology

## 2016-06-03 ENCOUNTER — Telehealth: Payer: Self-pay

## 2016-06-03 DIAGNOSIS — D649 Anemia, unspecified: Secondary | ICD-10-CM

## 2016-06-03 NOTE — Telephone Encounter (Signed)
Pt coming in for labs tomorrow, please place orders. Thank you 

## 2016-06-03 NOTE — Telephone Encounter (Signed)
Noted. Thanks.

## 2016-06-03 NOTE — Telephone Encounter (Signed)
Patient states DrSingh has added the labs to her lab orders and they will fax results once they receive them.

## 2016-06-03 NOTE — Telephone Encounter (Signed)
im sorry I ment patient wifes labs

## 2016-06-04 ENCOUNTER — Other Ambulatory Visit (INDEPENDENT_AMBULATORY_CARE_PROVIDER_SITE_OTHER): Payer: Medicare Other

## 2016-06-04 DIAGNOSIS — D649 Anemia, unspecified: Secondary | ICD-10-CM

## 2016-06-04 LAB — IRON,TIBC AND FERRITIN PANEL
%SAT: 22 % (ref 15–60)
Ferritin: 124 ng/mL (ref 20–380)
Iron: 80 ug/dL (ref 50–180)
TIBC: 370 ug/dL (ref 250–425)

## 2016-06-10 ENCOUNTER — Other Ambulatory Visit: Payer: Medicare Other

## 2016-06-16 ENCOUNTER — Ambulatory Visit
Admission: RE | Admit: 2016-06-16 | Discharge: 2016-06-16 | Disposition: A | Discharge status: Home / Self Care | Payer: Medicare Other | Source: Ambulatory Visit | Attending: Neurosurgery | Admitting: Neurosurgery

## 2016-06-16 VITALS — BP 136/67 | HR 50

## 2016-06-16 DIAGNOSIS — M5137 Other intervertebral disc degeneration, lumbosacral region: Secondary | ICD-10-CM

## 2016-06-16 DIAGNOSIS — M47817 Spondylosis without myelopathy or radiculopathy, lumbosacral region: Secondary | ICD-10-CM | POA: Diagnosis not present

## 2016-06-16 DIAGNOSIS — M48061 Spinal stenosis, lumbar region without neurogenic claudication: Secondary | ICD-10-CM

## 2016-06-16 DIAGNOSIS — M5416 Radiculopathy, lumbar region: Secondary | ICD-10-CM

## 2016-06-16 MED ORDER — IOPAMIDOL (ISOVUE-M 200) INJECTION 41%
1.0000 mL | Freq: Once | INTRAMUSCULAR | Status: AC
  Administered 2016-06-16: 1 mL via EPIDURAL

## 2016-06-16 MED ORDER — METHYLPREDNISOLONE ACETATE 40 MG/ML INJ SUSP (RADIOLOG
120.0000 mg | Freq: Once | INTRAMUSCULAR | Status: AC
  Administered 2016-06-16: 120 mg via EPIDURAL

## 2016-06-16 NOTE — Discharge Instructions (Signed)

## 2016-06-21 ENCOUNTER — Other Ambulatory Visit: Payer: Self-pay | Admitting: Pain Medicine

## 2016-06-21 DIAGNOSIS — M5416 Radiculopathy, lumbar region: Secondary | ICD-10-CM | POA: Diagnosis not present

## 2016-06-21 DIAGNOSIS — M791 Myalgia: Secondary | ICD-10-CM | POA: Diagnosis not present

## 2016-06-21 DIAGNOSIS — M533 Sacrococcygeal disorders, not elsewhere classified: Secondary | ICD-10-CM | POA: Diagnosis not present

## 2016-06-21 DIAGNOSIS — M47817 Spondylosis without myelopathy or radiculopathy, lumbosacral region: Secondary | ICD-10-CM | POA: Diagnosis not present

## 2016-06-23 ENCOUNTER — Other Ambulatory Visit: Payer: Self-pay | Admitting: Radiology

## 2016-06-23 ENCOUNTER — Telehealth: Payer: Self-pay | Admitting: Radiology

## 2016-06-23 DIAGNOSIS — L97521 Non-pressure chronic ulcer of other part of left foot limited to breakdown of skin: Secondary | ICD-10-CM | POA: Diagnosis not present

## 2016-06-23 DIAGNOSIS — D649 Anemia, unspecified: Secondary | ICD-10-CM

## 2016-06-23 NOTE — Telephone Encounter (Signed)
Pt dropped off stool card, please place orders. Thank you.

## 2016-06-23 NOTE — Telephone Encounter (Signed)
Order placed

## 2016-06-23 NOTE — Addendum Note (Signed)
Addended by: Caryl Bis, Kaniyah Lisby G on: 06/23/2016 12:21 PM   Modules accepted: Orders

## 2016-06-25 ENCOUNTER — Other Ambulatory Visit (INDEPENDENT_AMBULATORY_CARE_PROVIDER_SITE_OTHER): Payer: Medicare Other

## 2016-06-25 DIAGNOSIS — D649 Anemia, unspecified: Secondary | ICD-10-CM

## 2016-06-25 LAB — FECAL OCCULT BLOOD, IMMUNOCHEMICAL: Fecal Occult Bld: NEGATIVE

## 2016-06-29 ENCOUNTER — Telehealth: Payer: Self-pay | Admitting: Family Medicine

## 2016-06-29 ENCOUNTER — Other Ambulatory Visit: Payer: Self-pay | Admitting: Family Medicine

## 2016-06-29 ENCOUNTER — Other Ambulatory Visit: Payer: Self-pay | Admitting: *Deleted

## 2016-06-29 DIAGNOSIS — D649 Anemia, unspecified: Secondary | ICD-10-CM

## 2016-06-29 MED ORDER — HYDRALAZINE HCL 25 MG PO TABS: 25.0000 mg | ORAL_TABLET | Freq: Three times a day (TID) | ORAL | 3 refills | Status: DC

## 2016-06-29 NOTE — Telephone Encounter (Signed)
Pt called returning your call in regards to labs. Thank you!  Call pt @ [phone]

## 2016-06-29 NOTE — Telephone Encounter (Signed)
See result note.  

## 2016-07-06 ENCOUNTER — Ambulatory Visit (INDEPENDENT_AMBULATORY_CARE_PROVIDER_SITE_OTHER): Payer: Medicare Other

## 2016-07-06 VITALS — BP 132/62 | HR 60 | Temp 97.7°F | Resp 14 | Ht 70.0 in | Wt 185.4 lb

## 2016-07-06 DIAGNOSIS — Z0000 Encounter for general adult medical examination without abnormal findings: Secondary | ICD-10-CM | POA: Diagnosis not present

## 2016-07-06 NOTE — Progress Notes (Signed)
Subjective:   Bryan [name] is a 76 y.o. male who presents for Medicare Annual/Subsequent preventive examination.  Review of Systems:  No ROS.  Medicare Wellness Visit.  Cardiac Risk Factors include: hypertension;advanced age (>58men, >75 women);male gender     Objective:    Vitals: BP 132/62 (BP Location: Right Arm, Patient Position: Sitting, Cuff Size: Normal)   Pulse 60   Temp 97.7 F (36.5 C) (Oral)   Resp 14   Ht 5\' 10"  (1.778 m)   Wt 185 lb 6.4 oz (84.1 kg)   SpO2 95%   BMI 26.60 kg/m   Body mass index is 26.6 kg/m.  Tobacco History  Smoking Status  . Former Smoker  . Packs/day: 1.00  . Years: 32.00  . Types: Cigarettes  . Quit date: 07/05/1981  Smokeless Tobacco  . Never Used     Counseling given: Not Answered   Past Medical History:  Diagnosis Date  . Allergy   . Arrhythmia   . 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 Chronic back pain   . Complication of anesthesia    Memory loss 09/2015  . Coronary artery disease   . Depression   . Glaucoma   . H/O thoracic aortic aneurysm repair   . History of being hospitalized    memory lose kidney funtion down blood pressure up  . History of blood transfusion   . History of chicken pox   . Hyperlipidemia   . Hypertension   . Lumbar stenosis   . Paroxysmal a-fib ([place])   . Valvular heart disease    Past Surgical History:  Procedure Laterality Date  . AORTIC VALVE REPLACEMENT  2007   [facility].  [place], [place]  . APPENDECTOMY  2010  . CATARACT EXTRACTION W/ INTRAOCULAR LENS  IMPLANT, BILATERAL    . CHOLECYSTECTOMY  2010  . HAMMER TOE SURGERY Right 10/09/2015   Procedure: HAMMER TOE REPAIR WITH K-WIRE FIXATION RIGHT SECOND TOE;  Surgeon: Albertine Patricia, DPM;  Location: [place];  Service: Podiatry;  Laterality: Right;  WITH LOCAL  . LUMBAR LAMINECTOMY/DECOMPRESSION MICRODISCECTOMY Right 03/08/2016   Procedure: Laminectomy and Foraminotomy - Lumbar Five-Sacral  One Right;  Surgeon: Kary Kos, MD;  Location: [place];  Service: Neurosurgery;  Laterality: Right;  Right  . MULTIPLE TOOTH EXTRACTIONS    . REPLACEMENT TOTAL KNEE Left 2003  . THORACIC AORTIC ANEURYSM REPAIR  2010   Family History  Problem Relation Age of Onset  . Heart failure Mother   . Hypertension Mother   . Asthma Mother   . Heart attack Father 79    MI  . Hypertension Sister    History  Sexual Activity  . Sexual activity: Not Currently    Outpatient Encounter Prescriptions as of 07/06/2016  Medication Sig  . amLODipine (NORVASC) 5 MG tablet Take 1 tablet (5 mg total) by mouth daily.  Marland Kitchen amoxicillin (AMOXIL) 500 MG capsule Take 2,000 mg by mouth as needed (prior to dental procedures). Reported on 11/17/2015  . aspirin (GOODSENSE ASPIRIN) 325 MG tablet Take 325 mg by mouth daily.  . brimonidine (ALPHAGAN) 0.2 % ophthalmic solution Place 1 drop into both eyes every morning.   . cetirizine (ZYRTEC) 10 MG tablet Take 10 mg by mouth daily.  . cholecalciferol (VITAMIN D) 400 units TABS tablet Take 400 Units by mouth daily.  . fenofibrate 160 MG tablet Take 1 tablet (160 mg total) by mouth daily.  . fentaNYL ([place] -  DOSED MCG/HR) 25 MCG/HR patch Apply one patch to skin every 3 days if tolerated (Patient taking differently: Place 25 mcg onto the skin every 3 (three) days. Apply one patch to skin every 3 days if tolerated)  . FLUoxetine (PROZAC) 20 MG capsule TAKE 1 CAPSULE BY MOUTH EVERY DAY  . folic acid (FOLVITE) 1 MG tablet Take 1 mg by mouth 2 (two) times daily.  Marland Kitchen gabapentin (NEURONTIN) 800 MG tablet Limit 1 tablet by mouth 2-3 times per day if tolerated (Patient taking differently: Take 400 mg by mouth 3 (three) times daily. Limit 1 tablet by mouth 2-3 times per day if tolerated)  . hydrALAZINE (APRESOLINE) 25 MG tablet Take 1 tablet (25 mg total) by mouth 3 (three) times daily.  Marland Kitchen HYDROcodone-acetaminophen (NORCO/VICODIN) 5-325 MG tablet Limit  2-4 tablets by mouth per day for  breakthrough pain while wearing fentanyl patch if tolerated (Patient taking differently: Take 1 tablet by mouth 4 (four) times daily. Limit  2-4 tablets by mouth per day for breakthrough pain while wearing fentanyl patch if tolerated)  . hydroxychloroquine (PLAQUENIL) 200 MG tablet Take 200 mg by mouth daily.  Marland Kitchen lisinopril (PRINIVIL,ZESTRIL) 20 MG tablet Take 1 tablet (20 mg total) by mouth every evening.  . Magnesium 400 MG CAPS Take by mouth.  . predniSONE (DELTASONE) 5 MG tablet Take 5 mg by mouth daily as needed. Reported on 11/17/2015  . simvastatin (ZOCOR) 40 MG tablet Take 1 tablet (40 mg total) by mouth at bedtime.  . tamsulosin (FLOMAX) 0.4 MG CAPS capsule TAKE 1 CAPSULE BY MOUTH EVERY DAY  . traZODone (DESYREL) 50 MG tablet Take 100 mg by mouth at bedtime as needed for sleep. Reported on 10/09/2015  . vitamin B-12 (CYANOCOBALAMIN) 1000 MCG tablet Take 1,000 mcg by mouth daily.   Facility-Administered Encounter Medications as of 07/06/2016  Medication  . fentaNYL (SUBLIMAZE) injection 100 mcg  . lactated ringers infusion 1,000 mL  . midazolam (VERSED) 5 MG/5ML injection 5 mg    Activities of Daily Living In your present state of health, do you have any difficulty performing the following activities: 07/06/2016 03/03/2016  Hearing? N N  Vision? N N  Difficulty concentrating or making decisions? Y N  Walking or climbing stairs? Y Y  Dressing or bathing? N N  Doing errands, shopping? N N  Preparing Food and eating ? N -  Using the Toilet? N -  In the past six months, have you accidently leaked urine? N -  Do you have problems with loss of bowel control? N -  Managing your Medications? N -  Managing your Finances? Y -  Housekeeping or managing your Housekeeping? N -  Some recent data might be hidden    Patient Care Team: [name], MD as PCP - General (Family Medicine) Minna Merritts, MD as Consulting Physician (Cardiology)   Assessment:    This is a routine  wellness examination for Bryan [name]. The goal of the wellness visit is to assist the patient how to close the gaps in care and create a preventative care plan for the patient.   Taking calcium VIT D as appropriate/Osteoporosis risk reviewed.  Medications reviewed; taking without issues or barriers.  Safety issues reviewed; smoke detectors in the home. Firearms locked in a safe within the home. Wears seatbelts when driving or riding with others. No violence in the home.  No identified risk were noted; The patient was oriented x 3; appropriate in dress and manner and no objective failures  at ADL's or IADL's.   BMI; discussed the importance of a healthy diet, water intake and exercise. Educational material provided.  HTN; followed by PCP.  Health maintenance gaps; closed.  Patient Concerns: None at this time. Follow up with PCP as needed.  Exercise Activities and Dietary recommendations Current Exercise Habits: Home exercise routine, Type of exercise: walking, Time (Minutes): 25, Frequency (Times/Week): 7, Weekly Exercise (Minutes/Week): 175, Intensity: Mild  Goals    . Increase physical activity          Maintain current walking regimen and begin swimming again with water aerobics 3 times a week for 30 minute sessions.    . Increase water intake          Stay hydrated       Fall Risk Fall Risk  07/06/2016 01/28/2016 01/19/2016 11/28/2015 11/17/2015  Falls in the past year? No No No No No   Depression Screen PHQ 2/9 Scores 07/06/2016 01/19/2016 11/28/2015 11/17/2015  PHQ - 2 Score 0 0 0 0  Exception Documentation - - - -    Cognitive Function MMSE - Mini Mental State Exam 07/06/2016 06/27/2015  Orientation to time 5 5  Orientation to Place 5 5  Registration 3 3  Attention/ Calculation 5 5  Recall 3 3  Language- name 2 objects 2 2  Language- repeat 1 1  Language- follow 3 step command 3 3  Language- read & follow direction 1 1  Write a sentence 1 1  Copy design 1 1    Total score 30 30        Immunization History  Administered Date(s) Administered  . Influenza Split 04/09/2014  . Influenza, High Dose Seasonal PF 05/09/2015, 02/23/2016  . Pneumococcal Conjugate-13 12/13/2013  . Pneumococcal Polysaccharide-23 05/09/2015  . Tdap 12/13/2013   Screening Tests Health Maintenance  Topic Date Due  . TETANUS/TDAP  12/14/2023  . INFLUENZA VACCINE  Completed  . ZOSTAVAX  Addressed  . PNA vac Low Risk Adult  Completed      Plan:    End of life planning; Advance aging; Advanced directives discussed. Copy of current HCPOA/Living Will on file.    During the course of the visit the patient was educated and counseled about the following appropriate screening and preventive services:   Vaccines to include Pneumoccal, Influenza, Hepatitis B, Td, Zostavax, HCV  Electrocardiogram  Cardiovascular Disease  Colorectal cancer screening  Diabetes screening  Prostate Cancer Screening  Glaucoma screening  Nutrition counseling   Smoking cessation counseling  Patient Instructions (the written plan) was given to the patient.    Varney Biles, LPN  [id number]

## 2016-07-06 NOTE — Patient Instructions (Addendum)
  Bryan [name] , Thank you for taking time to come for your Medicare Wellness Visit. I appreciate your ongoing commitment to your health goals. Please review the following plan we discussed and let me know if I can assist you in the future.   Follow up with Dr.Sonnenberg as needed.  These are the goals we discussed: Goals    . Increase physical activity          Maintain current walking regimen and begin swimming again with water aerobics 3 times a week for 30 minute sessions.    . Increase water intake          Stay hydrated        This is a list of the screening recommended for you and due dates:  Health Maintenance  Topic Date Due  . Tetanus Vaccine  12/14/2023  . Flu Shot  Completed  . Shingles Vaccine  Addressed  . Pneumonia vaccines  Completed

## 2016-07-07 NOTE — Progress Notes (Signed)
I have reviewed the above note and agree.  Tinya Cadogan, M.D.  

## 2016-07-14 DIAGNOSIS — H40003 Preglaucoma, unspecified, bilateral: Secondary | ICD-10-CM | POA: Diagnosis not present

## 2016-07-16 ENCOUNTER — Ambulatory Visit: Payer: Medicare Other | Admitting: Hematology and Oncology

## 2016-07-18 NOTE — Progress Notes (Signed)
[facility] day:  07/19/2016  Chief Complaint: Bryan [name] is a 76 y.o. male with anemia who is referred by Dr. [name] in consultation for assessment and management.  HPI:  The patient notes "low RBCs for a long time" (years). Colonoscopy was negative 8-10 years ago. Guaiac cards have been negative.  He denies any melena or hematochezia.  He denies any hematuria.  His diet is balanced. He eats eggs, bacon and toast or pancakes for breakfast.  He may skip lunch or eat 1/2 a sandwich or soup.  He eats a balanced supper.  He describes eating salad, potatoes and pork chops for dinner recently. He will eat meat twice a week.  He eats raw vegetables 2x/week and cooked vegetables 2x/week.  He notes a history of rheumatoid arthritis.  He is followed by Dr. Precious Reel. He has been on Plaquenil for 2 1/2 years.  Previously, he was on methotrexate.  CBC on 01/30/2014 revealed a hematocrit of 32.3, hemoglobin 10.8, and MCV 96.4, platelets 158,00 and WBC 3000.  Differential included 57% segs, 17% lymphs, 17% monocytes, and 98% eosinophils.  Absolute monocyte count was 500.  He was not anemic between 03/28/2014 - 04/30/2014.  CBC on 05/30/2015 revealed a hematocrit of 35.0, hemoglobin 11.6, MCV 94.2, platelets 113,000, and WBC 3100.  CBC on 05/10/2016 revealed a hematocrit of 32.7, hemoglobin 10.6, platelets 121,000, and WBC 3500.  CBC on 06/02/2016 revealed a hematocrit of 29.5, hemoglobin 10.0, MCV 92.3, platelets 155,000, WBC 4200.  Stool was guaiac negative x 2 in 06/2016.  Labs on 06/04/2016 revealed a ferritin of 124, iron saturation 22% and TIBC of 370.  B12 was 883 on 06/18/2014.  He denies any exposure to radiation or toxins.  He denies any prior history of hepatitis.  He denies any prior transfusions or HIV risk factors.  He denies nay new medication or herbal products except for his blood pressure medication (amlodipine and  hydralazine).  Symptomatically, he notes that he hurts everywhere because of his arthritis (hands, feet, ankles, low back, hip).  He has lost 16-17 pounds in 8 months - 1 year.  He denies any nausea, vomiting or diarrhea.  He has had sweats x 6 months requiring a change of clothing.  He has had after bath itching x 1 year.  He denies any fevers.       Past Medical History:  Diagnosis Date  . Allergy   . Arrhythmia   . 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 Chronic back pain   . Complication of anesthesia    Memory loss 09/2015  . Coronary artery disease   . Depression   . Glaucoma   . H/O thoracic aortic aneurysm repair   . History of being hospitalized    memory lose kidney funtion down blood pressure up  . History of blood transfusion   . History of chicken pox   . Hyperlipidemia   . Hypertension   . Lumbar stenosis   . Paroxysmal a-fib ([place])   . Valvular heart disease     Past Surgical History:  Procedure Laterality Date  . AORTIC VALVE REPLACEMENT  2007   [facility]  [place] [place]  . APPENDECTOMY  2010  . CATARACT EXTRACTION W/ INTRAOCULAR LENS  IMPLANT, BILATERAL    . CHOLECYSTECTOMY  2010  . HAMMER TOE SURGERY Right 10/09/2015   Procedure: HAMMER TOE REPAIR WITH K-WIRE FIXATION RIGHT  SECOND TOE;  Surgeon: Albertine Patricia, DPM;  Location: [place];  Service: Podiatry;  Laterality: Right;  WITH LOCAL  . LUMBAR LAMINECTOMY/DECOMPRESSION MICRODISCECTOMY Right 03/08/2016   Procedure: Laminectomy and Foraminotomy - Lumbar Five-Sacral One Right;  Surgeon: Kary Kos, MD;  Location: [place];  Service: Neurosurgery;  Laterality: Right;  Right  . MULTIPLE TOOTH EXTRACTIONS    . REPLACEMENT TOTAL KNEE Left 2003  . THORACIC AORTIC ANEURYSM REPAIR  2010    Family History  Problem Relation Age of Onset  . Heart failure Mother   . Hypertension Mother   . Asthma Mother   . Heart attack Father 58    MI  . Hypertension Sister     Social  History:  reports that he quit smoking about 35 years ago. His smoking use included Cigarettes. He has a 32.00 pack-year smoking history. He has never used smokeless tobacco. He reports that he drinks about 0.6 oz of alcohol per week . He reports that he does not use drugs.  He smoked 1 pack a day x 12 years.  He stopped smoking 30 years ago.  He drinks a glass of wine occasionally.  He is a Software engineer.  [place]  The patient is alone today.  Allergies:  Allergies  Allergen Reactions  . Penicillins Hives and Swelling    SWELLING REACTION UNSPECIFIED PATIENT HAS TAKEN AMOXICILLIN ON MED HX FROM [facility] Has patient had a PCN reaction causing immediate rash, facial/tongue/throat swelling, SOB or lightheadedness with hypotension: Yes Has patient had a PCN reaction causing severe rash involving mucus membranes or skin necrosis: No Has patient had a PCN reaction that required hospitalization No Has patient had a PCN reaction occurring within the last 10 years: No If all of the above answers are "NO", then may proceed with  . Demerol [Meperidine] Hives and Nausea And Vomiting    Current Medications: Current Outpatient Prescriptions  Medication Sig Dispense Refill  . amLODipine (NORVASC) 5 MG tablet Take 1 tablet (5 mg total) by mouth daily. 90 tablet 3  . amoxicillin (AMOXIL) 500 MG capsule Take 2,000 mg by mouth as needed (prior to dental procedures). Reported on 11/17/2015    . aspirin (GOODSENSE ASPIRIN) 325 MG tablet Take 325 mg by mouth daily.    . brimonidine (ALPHAGAN) 0.2 % ophthalmic solution Place 1 drop into both eyes every morning.     . cetirizine (ZYRTEC) 10 MG tablet Take 10 mg by mouth daily.    . cholecalciferol (VITAMIN D) 400 units TABS tablet Take 400 Units by mouth daily.    . fenofibrate 160 MG tablet Take 1 tablet (160 mg total) by mouth daily. 90 tablet 1  . fentaNYL (DURAGESIC - DOSED MCG/HR) 25 MCG/HR patch Apply one patch  to skin every 3 days if tolerated (Patient taking differently: Place 25 mcg onto the skin every 3 (three) days. Apply one patch to skin every 3 days if tolerated) 10 patch 0  . FLUoxetine (PROZAC) 20 MG capsule TAKE 1 CAPSULE BY MOUTH EVERY DAY 90 capsule 0  . folic acid (FOLVITE) 1 MG tablet Take 1 mg by mouth 2 (two) times daily.    Marland Kitchen gabapentin (NEURONTIN) 800 MG tablet Limit 1 tablet by mouth 2-3 times per day if tolerated (Patient taking differently: Take 400 mg by mouth 3 (three) times daily. Limit 1 tablet by mouth 2-3 times per day if tolerated) 270 tablet 0  . hydrALAZINE (APRESOLINE)  25 MG tablet Take 1 tablet (25 mg total) by mouth 3 (three) times daily. 90 tablet 3  . HYDROcodone-acetaminophen (NORCO/VICODIN) 5-325 MG tablet Limit  2-4 tablets by mouth per day for breakthrough pain while wearing fentanyl patch if tolerated (Patient taking differently: Take 1 tablet by mouth 4 (four) times daily. Limit  2-4 tablets by mouth per day for breakthrough pain while wearing fentanyl patch if tolerated) 120 tablet 0  . hydroxychloroquine (PLAQUENIL) 200 MG tablet Take 200 mg by mouth daily.    Marland Kitchen lisinopril (PRINIVIL,ZESTRIL) 20 MG tablet Take 1 tablet (20 mg total) by mouth every evening. 90 tablet 3  . Magnesium 400 MG CAPS Take by mouth.    . predniSONE (DELTASONE) 5 MG tablet Take 5 mg by mouth daily as needed. Reported on 11/17/2015    . simvastatin (ZOCOR) 40 MG tablet Take 1 tablet (40 mg total) by mouth at bedtime. 90 tablet 3  . tamsulosin (FLOMAX) 0.4 MG CAPS capsule TAKE 1 CAPSULE BY MOUTH EVERY DAY 90 capsule 1  . traZODone (DESYREL) 50 MG tablet Take 100 mg by mouth at bedtime as needed for sleep. Reported on 10/09/2015    . vitamin B-12 (CYANOCOBALAMIN) 1000 MCG tablet Take 1,000 mcg by mouth daily.     Current Facility-Administered Medications  Medication Dose Route Frequency Provider Last Rate Last Dose  . fentaNYL (SUBLIMAZE) injection 100 mcg  100 mcg Intravenous Once Mohammed Kindle, MD      . lactated ringers infusion 1,000 mL  1,000 mL Intravenous Continuous Mohammed Kindle, MD 125 mL/hr at 01/19/16 1011 1,000 mL at 01/19/16 1011  . midazolam (VERSED) 5 MG/5ML injection 5 mg  5 mg Intravenous Once Mohammed Kindle, MD        Review of Systems:  GENERAL:  Feels fatigued and run down  No fevers.  Night sweats x 6 months.  Weight loss of 16-17 pounds in 8 months. PERFORMANCE STATUS (ECOG):  1 HEENT:  Runny nose.  Yearly eye checks.   No visual changes, sore throat, mouth sores or tenderness. Lungs: Shortness of breath with exertion.  No cough.  No hemoptysis. Cardiac:  Aortic valve replacement.  No chest pain, palpitations, orthopnea, or PND. GI:  Irritable bowel.  No nausea, vomiting, diarrhea, constipation, melena or hematochezia. GU:  Frequent urination s/p Flomax.  No urgency,dysuria, or hematuria. Musculoskeletal:  Back problems s/p laminectomy in 03/2016 followed by epidural x 2.  Severe rheumatoid arthritis (see HPI).  Stiff in morning.  No muscle tenderness. Extremities:  No pain or swelling. Skin:  After bath itching x 1 year.  No rashes or skin changes. Neuro:  Cane for balance.  No headache, numbness or weakness, balance or coordination issues. Endocrine:  No diabetes, thyroid issues, hot flashes or night sweats. Psych:  No mood changes, depression or anxiety. Pain:  No focal pain. Review of systems:  All other systems reviewed and found to be negative.  Physical Exam: Blood pressure 108/61, pulse 60, temperature 97.7 F (36.5 C), temperature source Tympanic, resp. rate 18, height 5' 10"  (1.778 m), weight 185 lb 10 oz (84.2 kg). GENERAL:  Elderly gentleman, sitting comfortably in the exam room in no acute distress. MENTAL STATUS:  Alert and oriented to person, place and time. HEAD:  Gray/white hair.  Thin beard.  Normocephalic, atraumatic, face symmetric, no Cushingoid features. EYES:  Hazel eyes.  Pupils equal round and reactive to light and accomodation.   No conjunctivitis or scleral icterus. ENT:  Oropharynx clear without lesion.  Tongue normal. Mucous membranes moist.  RESPIRATORY:  Clear to auscultation without rales, wheezes or rhonchi. CARDIOVASCULAR:  Regular rate and rhythm without murmur, rub or gallop. ABDOMEN:  Soft, non-tender, with active bowel sounds, and no hepatosplenomegaly.  No masses. SKIN:  No rashes, ulcers or lesions. EXTREMITIES: Ulnar deviation.  No edema, no skin discoloration or tenderness.  No palpable cords. LYMPH NODES: No palpable cervical, supraclavicular, axillary or inguinal adenopathy  NEUROLOGICAL: Unremarkable. PSYCH:  Appropriate.   No visits with results within 3 Day(s) from this visit.  Latest known visit with results is:  Appointment on 06/25/2016  Component Date Value Ref Range Status  . Fecal Occult Bld 06/25/2016 Negative  Negative Final    Assessment:  Bryan [name] is a 76 y.o. male with rheumatoid arthritis and progressive anemia over the past 2 years.  He has been on Plaquenil and hydralazine which can cause anemia.  Diet is good.  Colonoscopy was negative 8-10 years ago. Guaiac cards were negative x 2 in 06/2016.  He denies any melena or hematochezia. He denies any hematuria.  CBC on 06/02/2016 revealed a hematocrit of 29.5, hemoglobin 10.0, MCV 92.3, platelets 155,000, and WBC 4200.  Stool was guaiac negative x 2 in 06/2016.  Labs on 06/04/2016 revealed a ferritin of 124, iron saturation 22% and TIBC of 370.  B12 was 883 on 06/18/2014.  He denies any exposure to radiation or toxins.  He denies any prior history of hepatitis, prior transfusions or HIV risk factors.  He denies any herbal products.  Symptomatically, he hurts everywhere because of his arthritis (hands, feet, ankles, low back, hip).  He has lost 16-17 pounds in 8 months - 1 year.  He has had sweats x 6 months and after bath itching x 1 year.  He denies any fevers.  He has no palpable adenopathy or hepatosplenomegaly.       Plan: 1.  Discuss anemia work-up.  Discuss etiology may be secondary to anemia of chronic disease (rheumatoid arrhritis) or treatment (Plaquenil).  Hydralazine can also cause decreased RBC production or hemolytic anemia.  I discussed the possibility of a bone marrow aspirate and biopsy. 2.  Discuss concern for after bath itching, sweats, and weight loss (? Lymphoma).  May need CT scans. 3.  Labs today:  CBC with diff, CMP, LDH, uric acid, Coombs, retic, ESR, iron studies, folate, SPEP. 4.  Discuss Plaquenil with Dr. Precious Reel. 5.  RTC in 1 week for MD assessment, review of work-up and discussion regarding direction of therapy.   Lequita Asal, MD  07/19/2016, 3:00 PM

## 2016-07-19 ENCOUNTER — Encounter: Payer: Self-pay | Admitting: Hematology and Oncology

## 2016-07-19 ENCOUNTER — Inpatient Hospital Stay: Payer: Medicare Other

## 2016-07-19 ENCOUNTER — Inpatient Hospital Stay: Payer: Medicare Other | Attending: Hematology and Oncology | Admitting: Hematology and Oncology

## 2016-07-19 VITALS — BP 108/61 | HR 60 | Temp 97.7°F | Resp 18 | Ht 70.0 in | Wt 185.6 lb

## 2016-07-19 DIAGNOSIS — M069 Rheumatoid arthritis, unspecified: Secondary | ICD-10-CM | POA: Insufficient documentation

## 2016-07-19 DIAGNOSIS — M1990 Unspecified osteoarthritis, unspecified site: Secondary | ICD-10-CM | POA: Insufficient documentation

## 2016-07-19 DIAGNOSIS — I10 Essential (primary) hypertension: Secondary | ICD-10-CM | POA: Diagnosis not present

## 2016-07-19 DIAGNOSIS — Z87891 Personal history of nicotine dependence: Secondary | ICD-10-CM | POA: Diagnosis not present

## 2016-07-19 DIAGNOSIS — D649 Anemia, unspecified: Secondary | ICD-10-CM

## 2016-07-19 DIAGNOSIS — I2510 Atherosclerotic heart disease of native coronary artery without angina pectoris: Secondary | ICD-10-CM | POA: Diagnosis not present

## 2016-07-19 DIAGNOSIS — I499 Cardiac arrhythmia, unspecified: Secondary | ICD-10-CM | POA: Insufficient documentation

## 2016-07-19 DIAGNOSIS — E785 Hyperlipidemia, unspecified: Secondary | ICD-10-CM | POA: Diagnosis not present

## 2016-07-19 LAB — FOLATE: Folate: >70.2 ng/mL (ref >5.9)

## 2016-07-19 LAB — CBC WITH DIFFERENTIAL/PLATELET
Basophils Absolute: 0 10*3/uL (ref 0–0.1)
Basophils Relative: 1 %
Eosinophils Absolute: 0.1 10*3/uL (ref 0–0.7)
Eosinophils Relative: 3 %
HCT: 31.7 % — ABNORMAL LOW (ref 40.0–52.0)
Hemoglobin: 10.6 g/dL — ABNORMAL LOW (ref 13.0–18.0)
Lymphocytes Relative: 24 %
Lymphs Abs: 0.7 10*3/uL — ABNORMAL LOW (ref 1.0–3.6)
MCH: 30.5 pg (ref 26.0–34.0)
MCHC: 33.5 g/dL (ref 32.0–36.0)
MCV: 91 fL (ref 80.0–100.0)
Monocytes Absolute: 0.4 10*3/uL (ref 0.2–1.0)
Monocytes Relative: 14 %
Neutro Abs: 1.8 10*3/uL (ref 1.4–6.5)
Neutrophils Relative %: 58 %
Platelets: 143 10*3/uL — ABNORMAL LOW (ref 150–440)
RBC: 3.48 MIL/uL — ABNORMAL LOW (ref 4.40–5.90)
RDW: 12.9 % (ref 11.5–14.5)
WBC: 3 10*3/uL — ABNORMAL LOW (ref 3.8–10.6)

## 2016-07-19 LAB — COMPREHENSIVE METABOLIC PANEL
ALT: 15 U/L — ABNORMAL LOW (ref 17–63)
AST: 29 U/L (ref 15–41)
Albumin: 4.4 g/dL (ref 3.5–5.0)
Alkaline Phosphatase: 55 U/L (ref 38–126)
Anion gap: 6 (ref 5–15)
BUN: 39 mg/dL — ABNORMAL HIGH (ref 6–20)
CO2: 26 mmol/L (ref 22–32)
Calcium: 9.1 mg/dL (ref 8.9–10.3)
Chloride: 103 mmol/L (ref 101–111)
Creatinine, Ser: 1.57 mg/dL — ABNORMAL HIGH (ref 0.61–1.24)
GFR calc Af Amer: 47 mL/min — ABNORMAL LOW (ref >60)
GFR calc non Af Amer: 41 mL/min — ABNORMAL LOW (ref >60)
Glucose, Bld: 98 mg/dL (ref 65–99)
Potassium: 4.7 mmol/L (ref 3.5–5.1)
Sodium: 135 mmol/L (ref 135–145)
Total Bilirubin: 0.4 mg/dL (ref 0.3–1.2)
Total Protein: 7.5 g/dL (ref 6.5–8.1)

## 2016-07-19 LAB — IRON AND TIBC
Iron: 48 ug/dL (ref 45–182)
Saturation Ratios: 11 % — ABNORMAL LOW (ref 17.9–39.5)
TIBC: 454 ug/dL — ABNORMAL HIGH (ref 250–450)
UIBC: 406 ug/dL

## 2016-07-19 LAB — SEDIMENTATION RATE: Sed Rate: 19 mm/hr (ref 0–20)

## 2016-07-19 LAB — RETICULOCYTES
RBC.: 3.45 MIL/uL — ABNORMAL LOW (ref 4.40–5.90)
Retic Count, Absolute: 31.1 10*3/uL (ref 19.0–183.0)
Retic Ct Pct: 0.9 % (ref 0.4–3.1)

## 2016-07-19 LAB — URIC ACID: Uric Acid, Serum: 6.3 mg/dL (ref 4.4–7.6)

## 2016-07-19 LAB — LACTATE DEHYDROGENASE: LDH: 269 U/L — ABNORMAL HIGH (ref 98–192)

## 2016-07-19 LAB — DAT, POLYSPECIFIC AHG (ARMC ONLY): Polyspecific AHG test: NEGATIVE

## 2016-07-21 LAB — PROTEIN ELECTROPHORESIS, SERUM
A/G Ratio: 1.4 (ref 0.7–1.7)
Albumin ELP: 4 g/dL (ref 2.9–4.4)
Alpha-1-Globulin: 0.4 g/dL (ref 0.0–0.4)
Alpha-2-Globulin: 0.6 g/dL (ref 0.4–1.0)
Beta Globulin: 1 g/dL (ref 0.7–1.3)
Gamma Globulin: 0.9 g/dL (ref 0.4–1.8)
Globulin, Total: 2.8 g/dL (ref 2.2–3.9)
Total Protein ELP: 6.8 g/dL (ref 6.0–8.5)

## 2016-07-22 ENCOUNTER — Other Ambulatory Visit: Payer: Self-pay | Admitting: Pain Medicine

## 2016-07-29 ENCOUNTER — Inpatient Hospital Stay: Payer: Medicare Other | Attending: Hematology and Oncology | Admitting: Hematology and Oncology

## 2016-07-29 ENCOUNTER — Encounter: Payer: Self-pay | Admitting: Hematology and Oncology

## 2016-07-29 VITALS — BP 148/83 | HR 73 | Temp 96.9°F | Resp 18 | Wt 183.6 lb

## 2016-07-29 DIAGNOSIS — H409 Unspecified glaucoma: Secondary | ICD-10-CM | POA: Diagnosis not present

## 2016-07-29 DIAGNOSIS — I10 Essential (primary) hypertension: Secondary | ICD-10-CM | POA: Insufficient documentation

## 2016-07-29 DIAGNOSIS — D638 Anemia in other chronic diseases classified elsewhere: Secondary | ICD-10-CM

## 2016-07-29 DIAGNOSIS — E785 Hyperlipidemia, unspecified: Secondary | ICD-10-CM | POA: Diagnosis not present

## 2016-07-29 DIAGNOSIS — Z952 Presence of prosthetic heart valve: Secondary | ICD-10-CM | POA: Insufficient documentation

## 2016-07-29 DIAGNOSIS — M069 Rheumatoid arthritis, unspecified: Secondary | ICD-10-CM

## 2016-07-29 DIAGNOSIS — I480 Paroxysmal atrial fibrillation: Secondary | ICD-10-CM | POA: Insufficient documentation

## 2016-07-29 DIAGNOSIS — Z7982 Long term (current) use of aspirin: Secondary | ICD-10-CM | POA: Diagnosis not present

## 2016-07-29 DIAGNOSIS — Z79899 Other long term (current) drug therapy: Secondary | ICD-10-CM | POA: Insufficient documentation

## 2016-07-29 DIAGNOSIS — F17210 Nicotine dependence, cigarettes, uncomplicated: Secondary | ICD-10-CM

## 2016-07-29 DIAGNOSIS — D649 Anemia, unspecified: Secondary | ICD-10-CM

## 2016-07-29 DIAGNOSIS — R634 Abnormal weight loss: Secondary | ICD-10-CM

## 2016-07-29 DIAGNOSIS — I2510 Atherosclerotic heart disease of native coronary artery without angina pectoris: Secondary | ICD-10-CM | POA: Diagnosis not present

## 2016-07-29 DIAGNOSIS — R61 Generalized hyperhidrosis: Secondary | ICD-10-CM

## 2016-07-29 DIAGNOSIS — M48061 Spinal stenosis, lumbar region without neurogenic claudication: Secondary | ICD-10-CM | POA: Insufficient documentation

## 2016-07-29 NOTE — Progress Notes (Signed)
Patient states he has started on a B-Complex vitamin.  Patient wants to discuss labwork from last week.  Also has questions regarding CT or bone marrow biopsy. Patient states he had back surgery about 2 months ago.

## 2016-07-29 NOTE — Progress Notes (Signed)
[facility] day:  07/29/2016  Chief Complaint: Bryan [name] is a 76 y.o. male with anemia who is seen for review of work-up and discussion regarding direction of therapy.  HPI:  The patient was last seen in the hematology clinic on 07/19/2016.  At that time, he was seen for initial consultation.  Eiology was felt likley secondary to anemia of chronic disease (rheumatoid arrhritis) or treatment (Plaquenil).  Hydralazine could cause decreased RBC production or hemolytic anemia.  I discussed my concern for after bath itching, sweats, and weight loss (? lymphoma).  We discussed the possibility of a bone marrow and/or CT scans.  CBC revealed a hematocrit of 31.7, hemoglobin 10.6, MCV 91, platelets 143,000, white count 3000 with an ANC of 1800.  Absolute lymphocyte count was 700 (low). Creatinine was 1.57.  LDH was 269 (98-192).  Uric acid was 6.3 (normal).  Folate was > 70.2.  Coombs was negative.  SPEP revealed no monoclonal protein.  Iron studies included a saturation of 11% (low) and a TIBC of 454 (high) c/w iron deficiency anemia.  Sed rate was 19.  Retic was 0.9% (low).  Symptomatically, feels "the same".  He has arthritis pain (hands, feet, ankles, low back, hip).  He has lost 16-17 pounds in 8 months - 1 year.  He has lost an additional 2 pounds since last visit.  He denies any nausea, vomiting or diarrhea.  He has had sweats x 6 months requiring a change of clothing.  He has had after bath itching x 1 year.  He denies any fevers.   He denies any melena or hematochezia.    Past Medical History:  Diagnosis Date  . Allergy   . Arrhythmia   . 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 Chronic back pain   . Complication of anesthesia    Memory loss 09/2015  . Coronary artery disease   . Depression   . Glaucoma   . H/O thoracic aortic aneurysm repair   . History of being hospitalized    memory lose kidney funtion down blood  pressure up  . History of blood transfusion   . History of chicken pox   . Hyperlipidemia   . Hypertension   . Lumbar stenosis   . Paroxysmal a-fib ([place])   . Valvular heart disease     Past Surgical History:  Procedure Laterality Date  . AORTIC VALVE REPLACEMENT  2007   [facility].  [place]  . APPENDECTOMY  2010  . CATARACT EXTRACTION W/ INTRAOCULAR LENS  IMPLANT, BILATERAL    . CHOLECYSTECTOMY  2010  . HAMMER TOE SURGERY Right 10/09/2015   Procedure: HAMMER TOE REPAIR WITH K-WIRE FIXATION RIGHT SECOND TOE;  Surgeon: Albertine Patricia, DPM;  Location: [place];  Service: Podiatry;  Laterality: Right;  WITH LOCAL  . LUMBAR LAMINECTOMY/DECOMPRESSION MICRODISCECTOMY Right 03/08/2016   Procedure: Laminectomy and Foraminotomy - Lumbar Five-Sacral One Right;  Surgeon: Kary Kos, MD;  Location: [facility];  Service: Neurosurgery;  Laterality: Right;  Right  . MULTIPLE TOOTH EXTRACTIONS    . REPLACEMENT TOTAL KNEE Left 2003  . THORACIC AORTIC ANEURYSM REPAIR  2010    Family History  Problem Relation Age of Onset  . Heart failure Mother   . Hypertension Mother   . Asthma Mother   . Heart attack Father 29    MI  . Hypertension Sister     Social History:  reports that he quit smoking about 35 years ago. His smoking use included Cigarettes. He has a 32.00 pack-year smoking history. He has never used smokeless tobacco. He reports that he drinks about 0.6 oz of alcohol per week . He reports that he does not use drugs.  He smoked 1 pack a day x 12 years.  He stopped smoking 30 years ago.  He drinks a glass of wine occasionally.  He is a Software engineer.  [place]  The patient is alone today.  Allergies:  Allergies  Allergen Reactions  . Penicillins Hives and Swelling    SWELLING REACTION UNSPECIFIED PATIENT HAS TAKEN AMOXICILLIN ON MED HX FROM [facility] Has patient had a PCN reaction causing immediate rash, facial/tongue/throat  swelling, SOB or lightheadedness with hypotension: Yes Has patient had a PCN reaction causing severe rash involving mucus membranes or skin necrosis: No Has patient had a PCN reaction that required hospitalization No Has patient had a PCN reaction occurring within the last 10 years: No If all of the above answers are "NO", then may proceed with  . Demerol [Meperidine] Hives and Nausea And Vomiting    Current Medications: Current Outpatient Prescriptions  Medication Sig Dispense Refill  . amLODipine (NORVASC) 5 MG tablet Take 1 tablet (5 mg total) by mouth daily. 90 tablet 3  . amoxicillin (AMOXIL) 500 MG capsule Take 2,000 mg by mouth as needed (prior to dental procedures). Reported on 11/17/2015    . aspirin (GOODSENSE ASPIRIN) 325 MG tablet Take 325 mg by mouth daily.    . B Complex Vitamins (B COMPLEX-B12) TABS Take by mouth.    . brimonidine (ALPHAGAN) 0.2 % ophthalmic solution Place 1 drop into both eyes every morning.     . cetirizine (ZYRTEC) 10 MG tablet Take 10 mg by mouth daily.    . cholecalciferol (VITAMIN D) 400 units TABS tablet Take 400 Units by mouth daily.    . fenofibrate 160 MG tablet Take 1 tablet (160 mg total) by mouth daily. 90 tablet 1  . fentaNYL (DURAGESIC - DOSED MCG/HR) 25 MCG/HR patch Apply one patch to skin every 3 days if tolerated (Patient taking differently: Place 25 mcg onto the skin every 3 (three) days. Apply one patch to skin every 3 days if tolerated) 10 patch 0  . FLUoxetine (PROZAC) 20 MG capsule TAKE 1 CAPSULE BY MOUTH EVERY DAY 90 capsule 0  . folic acid (FOLVITE) 1 MG tablet Take 1 mg by mouth 2 (two) times daily.    Marland Kitchen gabapentin (NEURONTIN) 800 MG tablet Limit 1 tablet by mouth 2-3 times per day if tolerated (Patient taking differently: Take 400 mg by mouth 3 (three) times daily. Limit 1 tablet by mouth 2-3 times per day if tolerated) 270 tablet 0  . hydrALAZINE (APRESOLINE) 25 MG tablet Take 1 tablet (25 mg total) by mouth 3 (three) times daily. 90  tablet 3  . HYDROcodone-acetaminophen (NORCO/VICODIN) 5-325 MG tablet Limit  2-4 tablets by mouth per day for breakthrough pain while wearing fentanyl patch if tolerated (Patient taking differently: Take 1 tablet by mouth 4 (four) times daily. Limit  2-4 tablets by mouth per day for breakthrough pain while wearing fentanyl patch if tolerated) 120 tablet 0  . hydroxychloroquine (PLAQUENIL) 200 MG tablet Take 200 mg by mouth daily.    Marland Kitchen lisinopril (PRINIVIL,ZESTRIL) 20 MG tablet Take 1 tablet (20 mg total) by mouth every evening. 90 tablet 3  . Magnesium 400  MG CAPS Take by mouth.    . predniSONE (DELTASONE) 5 MG tablet Take 5 mg by mouth daily as needed. Reported on 11/17/2015    . simvastatin (ZOCOR) 40 MG tablet Take 1 tablet (40 mg total) by mouth at bedtime. 90 tablet 3  . tamsulosin (FLOMAX) 0.4 MG CAPS capsule TAKE 1 CAPSULE BY MOUTH EVERY DAY 90 capsule 1  . traZODone (DESYREL) 50 MG tablet Take 100 mg by mouth at bedtime as needed for sleep. Reported on 10/09/2015    . vitamin B-12 (CYANOCOBALAMIN) 1000 MCG tablet Take 1,000 mcg by mouth daily.     Current Facility-Administered Medications  Medication Dose Route Frequency Provider Last Rate Last Dose  . fentaNYL (SUBLIMAZE) injection 100 mcg  100 mcg Intravenous Once Mohammed Kindle, MD      . lactated ringers infusion 1,000 mL  1,000 mL Intravenous Continuous Mohammed Kindle, MD 125 mL/hr at 01/19/16 1011 1,000 mL at 01/19/16 1011  . midazolam (VERSED) 5 MG/5ML injection 5 mg  5 mg Intravenous Once Mohammed Kindle, MD        Review of Systems:  GENERAL:  Feels fatigued.  No fevers.  Night sweats x 6 months.  Weight loss of 16-17 pounds in 8 months (2 pounds since 07/19/2016). PERFORMANCE STATUS (ECOG):  1 HEENT:  Yearly eye checks.   No visual changes, sore throat, mouth sores or tenderness. Lungs: Shortness of breath with exertion.  No cough.  No hemoptysis. Cardiac:  Aortic valve replacement.  No chest pain, palpitations, orthopnea, or  PND. GI:  Irritable bowel.  No nausea, vomiting, diarrhea, constipation, melena or hematochezia. GU:  Frequent urination s/p Flomax.  No urgency,dysuria, or hematuria. Musculoskeletal:  Back problems s/p laminectomy in 03/2016 followed by epidural x 2.  Severe rheumatoid arthritis (see HPI).  Stiff in morning.  No muscle tenderness. Extremities:  No pain or swelling. Skin:  After bath itching x 1 year.  No rashes or skin changes. Neuro:  Cane for balance.  No headache, numbness or weakness, balance or coordination issues. Endocrine:  No diabetes, thyroid issues, hot flashes or night sweats. Psych:  No mood changes, depression or anxiety. Pain:  No focal pain. Review of systems:  All other systems reviewed and found to be negative.  Physical Exam: Blood pressure (!) 148/83, pulse 73, temperature (!) 96.9 F (36.1 C), temperature source Tympanic, resp. rate 18, weight 183 lb 10.3 oz (83.3 kg). GENERAL:  Elderly gentleman, sitting comfortably in the exam room in no acute distress. MENTAL STATUS:  Alert and oriented to person, place and time. HEAD:  Gray/white hair.  Thin beard.  Normocephalic, atraumatic, face symmetric, no Cushingoid features. EYES:  Hazel eyes.  No conjunctivitis or scleral icterus. ENT:  Oropharynx clear without lesion.  Tongue normal. Mucous membranes moist.  NEUROLOGICAL: Unremarkable. PSYCH:  Appropriate.   No visits with results within 3 Day(s) from this visit.  Latest known visit with results is:  Appointment on 07/19/2016  Component Date Value Ref Range Status  . WBC 07/19/2016 3.0* 3.8 - 10.6 K/uL Final  . RBC 07/19/2016 3.48* 4.40 - 5.90 MIL/uL Final  . Hemoglobin 07/19/2016 10.6* 13.0 - 18.0 g/dL Final  . HCT 07/19/2016 31.7* 40.0 - 52.0 % Final  . MCV 07/19/2016 91.0  80.0 - 100.0 fL Final  . MCH 07/19/2016 30.5  26.0 - 34.0 pg Final  . MCHC 07/19/2016 33.5  32.0 - 36.0 g/dL Final  . RDW 07/19/2016 12.9  11.5 - 14.5 % Final  . Platelets  07/19/2016 143*  150 - 440 K/uL Final  . Neutrophils Relative % 07/19/2016 58  % Final  . Neutro Abs 07/19/2016 1.8  1.4 - 6.5 K/uL Final  . Lymphocytes Relative 07/19/2016 24  % Final  . Lymphs Abs 07/19/2016 0.7* 1.0 - 3.6 K/uL Final  . Monocytes Relative 07/19/2016 14  % Final  . Monocytes Absolute 07/19/2016 0.4  0.2 - 1.0 K/uL Final  . Eosinophils Relative 07/19/2016 3  % Final  . Eosinophils Absolute 07/19/2016 0.1  0 - 0.7 K/uL Final  . Basophils Relative 07/19/2016 1  % Final  . Basophils Absolute 07/19/2016 0.0  0 - 0.1 K/uL Final  . Sodium 07/19/2016 135  135 - 145 mmol/L Final  . Potassium 07/19/2016 4.7  3.5 - 5.1 mmol/L Final  . Chloride 07/19/2016 103  101 - 111 mmol/L Final  . CO2 07/19/2016 26  22 - 32 mmol/L Final  . Glucose, Bld 07/19/2016 98  65 - 99 mg/dL Final  . BUN 07/19/2016 39* 6 - 20 mg/dL Final  . Creatinine, Ser 07/19/2016 1.57* 0.61 - 1.24 mg/dL Final  . Calcium 07/19/2016 9.1  8.9 - 10.3 mg/dL Final  . Total Protein 07/19/2016 7.5  6.5 - 8.1 g/dL Final  . Albumin 07/19/2016 4.4  3.5 - 5.0 g/dL Final  . AST 07/19/2016 29  15 - 41 U/L Final  . ALT 07/19/2016 15* 17 - 63 U/L Final  . Alkaline Phosphatase 07/19/2016 55  38 - 126 U/L Final  . Total Bilirubin 07/19/2016 0.4  0.3 - 1.2 mg/dL Final  . GFR calc non Af Amer 07/19/2016 41* >60 mL/min Final  . GFR calc Af Amer 07/19/2016 47* >60 mL/min Final   Comment: (NOTE) The eGFR has been calculated using the CKD EPI equation. This calculation has not been validated in all clinical situations. eGFR's persistently <60 mL/min signify possible Chronic Kidney Disease.   . Anion gap 07/19/2016 6  5 - 15 Final  . LDH 07/19/2016 269* 98 - 192 U/L Final  . Uric Acid, Serum 07/19/2016 6.3  4.4 - 7.6 mg/dL Final  . Retic Ct Pct 07/19/2016 0.9  0.4 - 3.1 % Final  . RBC. 07/19/2016 3.45* 4.40 - 5.90 MIL/uL Final  . Retic Count, Manual 07/19/2016 31.1  19.0 - 183.0 K/uL Final  . Sed Rate 07/19/2016 19  0 - 20 mm/hr Final  . Iron  07/19/2016 48  45 - 182 ug/dL Final  . TIBC 07/19/2016 454* 250 - 450 ug/dL Final  . Saturation Ratios 07/19/2016 11* 17.9 - 39.5 % Final  . UIBC 07/19/2016 406  ug/dL Final  . Folate 07/19/2016 >70.2  >5.9 ng/mL Final  . Polyspecific AHG test 07/19/2016 NEG   Final  . Total Protein ELP 07/19/2016 6.8  6.0 - 8.5 g/dL Final  . Albumin ELP 07/19/2016 4.0  2.9 - 4.4 g/dL Final  . Alpha-1-Globulin 07/19/2016 0.4  0.0 - 0.4 g/dL Final  . Alpha-2-Globulin 07/19/2016 0.6  0.4 - 1.0 g/dL Final  . Beta Globulin 07/19/2016 1.0  0.7 - 1.3 g/dL Final  . Gamma Globulin 07/19/2016 0.9  0.4 - 1.8 g/dL Final  . M-Spike, % 07/19/2016 Not Observed  Not Observed g/dL Final  . SPE Interp. 07/19/2016 Comment   Final   Comment: (NOTE) The SPE pattern appears essentially unremarkable. Evidence of monoclonal protein is not apparent. Performed At: [facility] [place], [place] [id number] Lindon Romp MD HU:[id number]   . Comment 07/19/2016 Comment  Final   Comment: (NOTE) Protein electrophoresis scan will follow via computer, mail, or courier delivery.   Marland Kitchen GLOBULIN, TOTAL 07/19/2016 2.8  2.2 - 3.9 g/dL Corrected  . A/G Ratio 07/19/2016 1.4  0.7 - 1.7 Corrected    Assessment:  Bryan [name] is a 76 y.o. male with rheumatoid arthritis and progressive anemia over the past 2 years.  He has been on Plaquenil and hydralazine which can cause anemia.  He denies any exposure to radiation or toxins.  He denies any prior history of hepatitis, prior transfusions or HIV risk factors.  He denies any herbal products.  Diet is good.  Colonoscopy was negative 8-10 years ago. Guaiac cards were negative x 2 in 06/2016.  He denies any melena or hematochezia. He denies any hematuria.  CBC on 06/02/2016 revealed a hematocrit of 29.5, hemoglobin 10.0, MCV 92.3, platelets 155,000, and WBC 4200.  Stool was guaiac negative x 2 in 06/2016.  Labs on 06/04/2016 revealed a ferritin of 124, iron  saturation 22% and TIBC of 370.  B12 was 883 on 06/18/2014.  Work-up on 07/19/2016 revealed a hematocrit of 31.7, hemoglobin 10.6, MCV 91, platelets 143,000, white count 3000 with an ANC of 1800.  Absolute lymphocyte count was 700 (low). Creatinine was 1.57.  LDH was 269 (98-192).  Normal labs included:  uric acid, folate, Coombs, and SPEP.  Iron studies included a saturation of 11% (low) and a TIBC of 454 (high) c/w iron deficiency anemia.  Sed rate was 19.  Retic was 0.9% (low).  Symptomatically, he hurts everywhere because of his arthritis (hands, feet, ankles, low back, hip).  He has lost 18-19 pounds in 8 months - 1 year.  He has had sweats x 6 months and after bath itching x 1 year.  He denies any fevers.  He has no palpable adenopathy or hepatosplenomegaly.      Plan: 1.  Discuss anemia work-up.  Discuss iron deficiency anemia.  Diet appears good.  Discuss initiation of oral iron with OJ or vitamin C.  Patient denies any melena or hematochezia.  Discuss plan for GI evaluation.  Patient's last colonoscopy was 8-10 years ago.  2.  Discuss possible contribution of anemia of chronic disease (rheumatoid arrhritis) or treatment (Plaquenil).  Hydralazine can also cause decreased RBC production.  No evidence of hemolytic anemia.  Discuss Plaquenil with Dr. Precious Reel. 3.  Discuss ongoing concern for after bath itching, sweats, and weight loss (lymphoma).  Discuss CT scans without contrast given patient's renal insufficiency. 4.  Consult gastroenterology. 5.  Schedule chest, abdomen, and pelvic CT scan. 6.  RTC in 2 weeks for MD assessment, labs (CBC, retic), and review of scans.   Lequita Asal, MD  07/29/2016, 4:19 PM

## 2016-08-09 ENCOUNTER — Ambulatory Visit
Admission: RE | Admit: 2016-08-09 | Discharge: 2016-08-09 | Disposition: A | Discharge status: Home / Self Care | Payer: Medicare Other | Source: Ambulatory Visit | Attending: Hematology and Oncology | Admitting: Hematology and Oncology

## 2016-08-09 DIAGNOSIS — K449 Diaphragmatic hernia without obstruction or gangrene: Secondary | ICD-10-CM | POA: Diagnosis not present

## 2016-08-09 DIAGNOSIS — J439 Emphysema, unspecified: Secondary | ICD-10-CM | POA: Insufficient documentation

## 2016-08-09 DIAGNOSIS — R188 Other ascites: Secondary | ICD-10-CM | POA: Diagnosis not present

## 2016-08-09 DIAGNOSIS — N281 Cyst of kidney, acquired: Secondary | ICD-10-CM | POA: Diagnosis not present

## 2016-08-09 DIAGNOSIS — R61 Generalized hyperhidrosis: Secondary | ICD-10-CM | POA: Diagnosis present

## 2016-08-09 DIAGNOSIS — N200 Calculus of kidney: Secondary | ICD-10-CM | POA: Insufficient documentation

## 2016-08-09 DIAGNOSIS — R634 Abnormal weight loss: Secondary | ICD-10-CM | POA: Diagnosis present

## 2016-08-09 DIAGNOSIS — Z9049 Acquired absence of other specified parts of digestive tract: Secondary | ICD-10-CM | POA: Diagnosis not present

## 2016-08-09 DIAGNOSIS — R935 Abnormal findings on diagnostic imaging of other abdominal regions, including retroperitoneum: Secondary | ICD-10-CM | POA: Diagnosis not present

## 2016-08-09 DIAGNOSIS — I700 Atherosclerosis of aorta: Secondary | ICD-10-CM | POA: Diagnosis not present

## 2016-08-09 DIAGNOSIS — D649 Anemia, unspecified: Secondary | ICD-10-CM | POA: Diagnosis not present

## 2016-08-09 DIAGNOSIS — R590 Localized enlarged lymph nodes: Secondary | ICD-10-CM | POA: Insufficient documentation

## 2016-08-09 DIAGNOSIS — K6289 Other specified diseases of anus and rectum: Secondary | ICD-10-CM | POA: Diagnosis not present

## 2016-08-13 ENCOUNTER — Inpatient Hospital Stay: Payer: Medicare Other

## 2016-08-13 ENCOUNTER — Inpatient Hospital Stay (HOSPITAL_BASED_OUTPATIENT_CLINIC_OR_DEPARTMENT_OTHER): Payer: Medicare Other | Admitting: Hematology and Oncology

## 2016-08-13 ENCOUNTER — Encounter: Payer: Self-pay | Admitting: Hematology and Oncology

## 2016-08-13 VITALS — BP 109/60 | HR 69 | Temp 97.8°F | Resp 18 | Wt 187.0 lb

## 2016-08-13 DIAGNOSIS — I2510 Atherosclerotic heart disease of native coronary artery without angina pectoris: Secondary | ICD-10-CM | POA: Diagnosis not present

## 2016-08-13 DIAGNOSIS — D638 Anemia in other chronic diseases classified elsewhere: Secondary | ICD-10-CM

## 2016-08-13 DIAGNOSIS — D649 Anemia, unspecified: Secondary | ICD-10-CM

## 2016-08-13 DIAGNOSIS — Z79899 Other long term (current) drug therapy: Secondary | ICD-10-CM | POA: Diagnosis not present

## 2016-08-13 DIAGNOSIS — I480 Paroxysmal atrial fibrillation: Secondary | ICD-10-CM | POA: Diagnosis not present

## 2016-08-13 DIAGNOSIS — M069 Rheumatoid arthritis, unspecified: Secondary | ICD-10-CM | POA: Diagnosis not present

## 2016-08-13 DIAGNOSIS — I10 Essential (primary) hypertension: Secondary | ICD-10-CM | POA: Diagnosis not present

## 2016-08-13 DIAGNOSIS — F17210 Nicotine dependence, cigarettes, uncomplicated: Secondary | ICD-10-CM

## 2016-08-13 NOTE — Progress Notes (Signed)
[name]:  08/13/2016  Chief Complaint: Bryan [name] is a 76 y.o. male with rheumatoid arthritis and iron deficiency anemia who is seen for review of interval scans.  HPI:  The patient was last seen in the hematology clinic on 07/29/2016.  At that time, workup was reviewed. Iron studies revealed a low iron saturation and high TIBC consistent with iron deficiency anemia. We discussed the plan for GI evaluation. His last colonoscopy was 8-10 years ago. He also discussed the possible contribution of anemia of chronic disease due to his rheumatoid arthritis and treatment with Plaquenil.  At last visit, there was ongoing concern for after bath itching, sweats and weight loss suggestive of a possible lymphoma. We discussed CT scans without contrast secondary to his renal insufficiency. Gastroeneterology was consulted.  Chest, abdomen, and pelvic CT scan on 08/09/2016 revealed no acute findings or clear evidence of malignancy in the chest, abdomen or pelvis.  There was asymmetric left rectal wall thickening possibly secondary to volume averaging with the prostate gland.  Correlation with digital rectal examination recommended.  There was mild emphysema and subpleural reticulation in both lung bases.  There was atherosclerosis and an involuting left renal cyst.  There was a small amount fluid in the right inguinal canal with probable adjacent right inguinal reactive lymph nodes.  Symptomatically, the patient notes no concerns. His wife states that he has had sweats for 4-5 years. Regarding his rectal asymmetry, he notes trouble cleaning himself. He denies any melena or hematochezia.   Past Medical History:  Diagnosis Date  . Allergy   . Arrhythmia   . 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 Chronic back pain   . Complication of anesthesia    Memory loss 09/2015  . Coronary artery disease   . Depression   . Glaucoma   .  H/O thoracic aortic aneurysm repair   . History of being hospitalized    memory lose kidney funtion down blood pressure up  . History of blood transfusion   . History of chicken pox   . Hyperlipidemia   . Hypertension   . Lumbar stenosis   . Paroxysmal a-fib ([place])   . Valvular heart disease     Past Surgical History:  Procedure Laterality Date  . AORTIC VALVE REPLACEMENT  2007   [facility].  [place], [place]  . APPENDECTOMY  2010  . CATARACT EXTRACTION W/ INTRAOCULAR LENS  IMPLANT, BILATERAL    . CHOLECYSTECTOMY  2010  . HAMMER TOE SURGERY Right 10/09/2015   Procedure: HAMMER TOE REPAIR WITH K-WIRE FIXATION RIGHT SECOND TOE;  Surgeon: Albertine Patricia, DPM;  Location: [facility];  Service: Podiatry;  Laterality: Right;  WITH LOCAL  . LUMBAR LAMINECTOMY/DECOMPRESSION MICRODISCECTOMY Right 03/08/2016   Procedure: Laminectomy and Foraminotomy - Lumbar Five-Sacral One Right;  Surgeon: Kary Kos, MD;  Location: [facility];  Service: Neurosurgery;  Laterality: Right;  Right  . MULTIPLE TOOTH EXTRACTIONS    . REPLACEMENT TOTAL KNEE Left 2003  . THORACIC AORTIC ANEURYSM REPAIR  2010    Family History  Problem Relation Age of Onset  . Heart failure Mother   . Hypertension Mother   . Asthma Mother   . Heart attack Father 51    MI  . Hypertension Sister     Social History:  reports that he quit smoking about 35 years ago. His smoking use included Cigarettes. He has a 32.00  pack-year smoking history. He has never used smokeless tobacco. He reports that he drinks about 0.6 oz of alcohol per week . He reports that he does not use drugs.  He smoked 1 pack a day x 12 years.  He stopped smoking 30 years ago.  He drinks a glass of wine occasionally.  He is a Software engineer.  [place]  The patient is accompanied by his wife today.  Allergies:  Allergies  Allergen Reactions  . Penicillins Hives and Swelling    SWELLING REACTION  UNSPECIFIED PATIENT HAS TAKEN AMOXICILLIN ON MED HX FROM [facility] Has patient had a PCN reaction causing immediate rash, facial/tongue/throat swelling, SOB or lightheadedness with hypotension: Yes Has patient had a PCN reaction causing severe rash involving mucus membranes or skin necrosis: No Has patient had a PCN reaction that required hospitalization No Has patient had a PCN reaction occurring within the last 10 years: No If all of the above answers are "NO", then may proceed with  . Demerol [Meperidine] Hives and Nausea And Vomiting    Current Medications: Current Outpatient Prescriptions  Medication Sig Dispense Refill  . amLODipine (NORVASC) 5 MG tablet Take 1 tablet (5 mg total) by mouth daily. 90 tablet 3  . amoxicillin (AMOXIL) 500 MG capsule Take 2,000 mg by mouth as needed (prior to dental procedures). Reported on 11/17/2015    . aspirin (GOODSENSE ASPIRIN) 325 MG tablet Take 325 mg by mouth daily.    . B Complex Vitamins (B COMPLEX-B12) TABS Take by mouth.    . brimonidine (ALPHAGAN) 0.2 % ophthalmic solution Place 1 drop into both eyes every morning.     . cetirizine (ZYRTEC) 10 MG tablet Take 10 mg by mouth daily.    . cholecalciferol (VITAMIN D) 400 units TABS tablet Take 400 Units by mouth daily.    . fenofibrate 160 MG tablet Take 1 tablet (160 mg total) by mouth daily. 90 tablet 1  . fentaNYL (DURAGESIC - DOSED MCG/HR) 25 MCG/HR patch Apply one patch to skin every 3 days if tolerated (Patient taking differently: Place 25 mcg onto the skin every 3 (three) days. Apply one patch to skin every 3 days if tolerated) 10 patch 0  . FLUoxetine (PROZAC) 20 MG capsule TAKE 1 CAPSULE BY MOUTH EVERY DAY 90 capsule 0  . folic acid (FOLVITE) 1 MG tablet Take 1 mg by mouth 2 (two) times daily.    Marland Kitchen gabapentin (NEURONTIN) 800 MG tablet Limit 1 tablet by mouth 2-3 times per day if tolerated (Patient taking differently: Take 400 mg by mouth 3 (three) times daily. Limit 1 tablet by mouth 2-3 times  per day if tolerated) 270 tablet 0  . hydrALAZINE (APRESOLINE) 25 MG tablet Take 1 tablet (25 mg total) by mouth 3 (three) times daily. 90 tablet 3  . HYDROcodone-acetaminophen (NORCO/VICODIN) 5-325 MG tablet Limit  2-4 tablets by mouth per day for breakthrough pain while wearing fentanyl patch if tolerated (Patient taking differently: Take 1 tablet by mouth 4 (four) times daily. Limit  2-4 tablets by mouth per day for breakthrough pain while wearing fentanyl patch if tolerated) 120 tablet 0  . hydroxychloroquine (PLAQUENIL) 200 MG tablet Take 200 mg by mouth daily.    Marland Kitchen lisinopril (PRINIVIL,ZESTRIL) 20 MG tablet Take 1 tablet (20 mg total) by mouth every evening. 90 tablet 3  . Magnesium 400 MG CAPS Take by mouth.    . predniSONE (DELTASONE) 5 MG tablet Take  5 mg by mouth daily as needed. Reported on 11/17/2015    . simvastatin (ZOCOR) 40 MG tablet Take 1 tablet (40 mg total) by mouth at bedtime. 90 tablet 3  . tamsulosin (FLOMAX) 0.4 MG CAPS capsule TAKE 1 CAPSULE BY MOUTH EVERY DAY 90 capsule 1  . traZODone (DESYREL) 50 MG tablet Take 100 mg by mouth at bedtime as needed for sleep. Reported on 10/09/2015    . vitamin B-12 (CYANOCOBALAMIN) 1000 MCG tablet Take 1,000 mcg by mouth daily.     Current Facility-Administered Medications  Medication Dose Route Frequency Provider Last Rate Last Dose  . fentaNYL (SUBLIMAZE) injection 100 mcg  100 mcg Intravenous Once Mohammed Kindle, MD      . lactated ringers infusion 1,000 mL  1,000 mL Intravenous Continuous Mohammed Kindle, MD 125 mL/hr at 01/19/16 1011 1,000 mL at 01/19/16 1011  . midazolam (VERSED) 5 MG/5ML injection 5 mg  5 mg Intravenous Once Mohammed Kindle, MD        Review of Systems:  GENERAL:  Feels fatigued.  No fevers.  Night sweats x 6 months.  Weight loss of 16-17 pounds in 8 months (weight up 4 pounds since 07/29/2016). PERFORMANCE STATUS (ECOG):  1 HEENT:  Yearly eye checks.   No visual changes, sore throat, mouth sores or  tenderness. Lungs: Shortness of breath with exertion.  No cough.  No hemoptysis. Cardiac:  Aortic valve replacement.  No chest pain, palpitations, orthopnea, or PND. GI:  Irritable bowel.  No nausea, vomiting, diarrhea, constipation, melena or hematochezia. GU:  Frequent urination s/p Flomax.  No urgency,dysuria, or hematuria. Musculoskeletal:  Back problems s/p laminectomy in 03/2016 followed by epidural x 2.  Severe rheumatoid arthritis (see HPI).  Stiff in morning.  No muscle tenderness. Extremities:  No pain or swelling. Skin:  After bath itching x 1 year.  No rashes or skin changes. Neuro:  Cane for balance.  No headache, numbness or weakness, balance or coordination issues. Endocrine:  No diabetes, thyroid issues, hot flashes or night sweats. Psych:  No mood changes, depression or anxiety. Pain:  No focal pain. Review of systems:  All other systems reviewed and found to be negative.  Physical Exam: Blood pressure 109/60, pulse 73, temperature 97.8 F (36.6 C) temperature source Tympanic, resp. rate 18, weight 187 lb (84.8 kg). GENERAL:  Elderly gentleman, sitting comfortably in the exam room in no acute distress. MENTAL STATUS:  Alert and oriented to person, place and time. HEAD:  Gray/white hair.  Thin beard.  Normocephalic, atraumatic, face symmetric, no Cushingoid features. EYES:  Hazel eyes.  No conjunctivitis or scleral icterus. ENT:  Oropharynx clear without lesion.  Tongue normal. Mucous membranes moist.  NEUROLOGICAL: Unremarkable. PSYCH:  Appropriate.   No visits with results within 3 Day(s) from this visit.  Latest known visit with results is:  Appointment on 07/19/2016  Component Date Value Ref Range Status  . WBC 07/19/2016 3.0* 3.8 - 10.6 K/uL Final  . RBC 07/19/2016 3.48* 4.40 - 5.90 MIL/uL Final  . Hemoglobin 07/19/2016 10.6* 13.0 - 18.0 g/dL Final  . HCT 07/19/2016 31.7* 40.0 - 52.0 % Final  . MCV 07/19/2016 91.0  80.0 - 100.0 fL Final  . MCH 07/19/2016 30.5   26.0 - 34.0 pg Final  . MCHC 07/19/2016 33.5  32.0 - 36.0 g/dL Final  . RDW 07/19/2016 12.9  11.5 - 14.5 % Final  . Platelets 07/19/2016 143* 150 - 440 K/uL Final  . Neutrophils Relative % 07/19/2016 58  %  Final  . Neutro Abs 07/19/2016 1.8  1.4 - 6.5 K/uL Final  . Lymphocytes Relative 07/19/2016 24  % Final  . Lymphs Abs 07/19/2016 0.7* 1.0 - 3.6 K/uL Final  . Monocytes Relative 07/19/2016 14  % Final  . Monocytes Absolute 07/19/2016 0.4  0.2 - 1.0 K/uL Final  . Eosinophils Relative 07/19/2016 3  % Final  . Eosinophils Absolute 07/19/2016 0.1  0 - 0.7 K/uL Final  . Basophils Relative 07/19/2016 1  % Final  . Basophils Absolute 07/19/2016 0.0  0 - 0.1 K/uL Final  . Sodium 07/19/2016 135  135 - 145 mmol/L Final  . Potassium 07/19/2016 4.7  3.5 - 5.1 mmol/L Final  . Chloride 07/19/2016 103  101 - 111 mmol/L Final  . CO2 07/19/2016 26  22 - 32 mmol/L Final  . Glucose, Bld 07/19/2016 98  65 - 99 mg/dL Final  . BUN 07/19/2016 39* 6 - 20 mg/dL Final  . Creatinine, Ser 07/19/2016 1.57* 0.61 - 1.24 mg/dL Final  . Calcium 07/19/2016 9.1  8.9 - 10.3 mg/dL Final  . Total Protein 07/19/2016 7.5  6.5 - 8.1 g/dL Final  . Albumin 07/19/2016 4.4  3.5 - 5.0 g/dL Final  . AST 07/19/2016 29  15 - 41 U/L Final  . ALT 07/19/2016 15* 17 - 63 U/L Final  . Alkaline Phosphatase 07/19/2016 55  38 - 126 U/L Final  . Total Bilirubin 07/19/2016 0.4  0.3 - 1.2 mg/dL Final  . GFR calc non Af Amer 07/19/2016 41* >60 mL/min Final  . GFR calc Af Amer 07/19/2016 47* >60 mL/min Final   Comment: (NOTE) The eGFR has been calculated using the CKD EPI equation. This calculation has not been validated in all clinical situations. eGFR's persistently <60 mL/min signify possible Chronic Kidney Disease.   . Anion gap 07/19/2016 6  5 - 15 Final  . LDH 07/19/2016 269* 98 - 192 U/L Final  . Uric Acid, Serum 07/19/2016 6.3  4.4 - 7.6 mg/dL Final  . Retic Ct Pct 07/19/2016 0.9  0.4 - 3.1 % Final  . RBC. 07/19/2016 3.45* 4.40  - 5.90 MIL/uL Final  . Retic Count, Manual 07/19/2016 31.1  19.0 - 183.0 K/uL Final  . Sed Rate 07/19/2016 19  0 - 20 mm/hr Final  . Iron 07/19/2016 48  45 - 182 ug/dL Final  . TIBC 07/19/2016 454* 250 - 450 ug/dL Final  . Saturation Ratios 07/19/2016 11* 17.9 - 39.5 % Final  . UIBC 07/19/2016 406  ug/dL Final  . Folate 07/19/2016 >70.2  >5.9 ng/mL Final  . Polyspecific AHG test 07/19/2016 NEG   Final  . Total Protein ELP 07/19/2016 6.8  6.0 - 8.5 g/dL Final  . Albumin ELP 07/19/2016 4.0  2.9 - 4.4 g/dL Final  . Alpha-1-Globulin 07/19/2016 0.4  0.0 - 0.4 g/dL Final  . Alpha-2-Globulin 07/19/2016 0.6  0.4 - 1.0 g/dL Final  . Beta Globulin 07/19/2016 1.0  0.7 - 1.3 g/dL Final  . Gamma Globulin 07/19/2016 0.9  0.4 - 1.8 g/dL Final  . M-Spike, % 07/19/2016 Not Observed  Not Observed g/dL Final  . SPE Interp. 07/19/2016 Comment   Final   Comment: (NOTE) The SPE pattern appears essentially unremarkable. Evidence of monoclonal protein is not apparent. Performed At: [facility] [place], [place] [id number] Lindon Romp MD [id number]   . Comment 07/19/2016 Comment   Final   Comment: (NOTE) Protein electrophoresis scan will follow via computer, mail, or courier delivery.   Marland Kitchen  GLOBULIN, TOTAL 07/19/2016 2.8  2.2 - 3.9 g/dL Corrected  . A/G Ratio 07/19/2016 1.4  0.7 - 1.7 Corrected    Assessment:  Bryan [name] is a 76 y.o. male with rheumatoid arthritis and progressive anemia over the past 2 years.  He has been on Plaquenil and hydralazine which can cause anemia.  He denies any exposure to radiation or toxins.  He denies any prior history of hepatitis, prior transfusions or HIV risk factors.  He denies any herbal products.  Diet is good.  Colonoscopy was negative 8-10 years ago. Guaiac cards were negative x 2 in 06/2016.  He denies any melena or hematochezia. He denies any hematuria.  CBC on 06/02/2016 revealed a hematocrit of 29.5, hemoglobin 10.0, MCV  92.3, platelets 155,000, and WBC 4200.  Stool was guaiac negative x 2 in 06/2016.  Labs on 06/04/2016 revealed a ferritin of 124, iron saturation 22% and TIBC of 370.  B12 was 883 on 06/18/2014.  Work-up on 07/19/2016 revealed a hematocrit of 31.7, hemoglobin 10.6, MCV 91, platelets 143,000, white count 3000 with an ANC of 1800.  Absolute lymphocyte count was 700 (low). Creatinine was 1.57.  LDH was 269 (98-192).  Normal labs included:  uric acid, folate, [place]om[place], and SPEP.  Iron studies included a saturation of 11% (low) and a TIBC of 454 (high) c/w iron deficiency anemia.  Sed rate was 19.  Retic was 0.9% (low).  Chest, abdomen, and pelvic CT on 08/09/2016 revealed no acute findings or clear evidence of malignancy in the chest, abdomen or pelvis.  There was asymmetric left rectal wall thickening possibly secondary to volume averaging with the prostate gland.  Symptomatically, he hurts everywhere because of his arthritis (hands, feet, ankles, low back, hip).  He has lost 18-19 pounds in 8 months - 1 year.  He has had sweats x 6 months and after bath itching x 1 year.  He denies any fevers.  He has no palpable adenopathy or hepatosplenomegaly.      Plan: 1.  Review CT scans. No evidence of lymphoma. Unclear significance of asymmetric left rectal wall thickening. Encourage follow-up with gastroenterology. 2.  Discuss leukopenia.  Check copper level. 3.  RTC in 1 wk for MD assessment and labs (CBC with diff, copper).   Lequita Asal, MD  08/13/2016

## 2016-08-16 ENCOUNTER — Other Ambulatory Visit: Payer: Self-pay | Admitting: *Deleted

## 2016-08-16 ENCOUNTER — Telehealth: Payer: Self-pay | Admitting: Family Medicine

## 2016-08-16 DIAGNOSIS — D649 Anemia, unspecified: Secondary | ICD-10-CM

## 2016-08-16 NOTE — Telephone Encounter (Signed)
He was referred to Leighton Ruff. Will forward to Melissa to get the contact information to the patient.

## 2016-08-16 NOTE — Telephone Encounter (Signed)
Please advise 

## 2016-08-16 NOTE — Telephone Encounter (Signed)
Pt called requesting the surgeon's name and information that Dr. Caryl Bis had given him. He lost the papers. Please advise, thank you!  Call pt @ [phone]

## 2016-08-17 ENCOUNTER — Telehealth: Payer: Self-pay | Admitting: *Deleted

## 2016-08-17 DIAGNOSIS — L97511 Non-pressure chronic ulcer of other part of right foot limited to breakdown of skin: Secondary | ICD-10-CM | POA: Diagnosis not present

## 2016-08-17 DIAGNOSIS — L089 Local infection of the skin and subcutaneous tissue, unspecified: Secondary | ICD-10-CM | POA: Diagnosis not present

## 2016-08-17 DIAGNOSIS — L02619 Cutaneous abscess of unspecified foot: Secondary | ICD-10-CM | POA: Diagnosis not present

## 2016-08-17 DIAGNOSIS — D509 Iron deficiency anemia, unspecified: Secondary | ICD-10-CM

## 2016-08-17 DIAGNOSIS — L03119 Cellulitis of unspecified part of limb: Secondary | ICD-10-CM | POA: Diagnosis not present

## 2016-08-17 NOTE — Telephone Encounter (Signed)
  Let's refer to Dr. Betsey Holiday

## 2016-08-17 NOTE — Telephone Encounter (Signed)
His surgeon does not do colonoscopies so he needs a referral to someone in [place] , no preferences to get it done. Please inform patient once it is scheduled

## 2016-08-18 NOTE — Telephone Encounter (Signed)
GI Referral entered in computer, this message will be forwarded to schedulers

## 2016-08-19 ENCOUNTER — Inpatient Hospital Stay: Payer: Medicare Other

## 2016-08-19 ENCOUNTER — Encounter: Payer: Self-pay | Admitting: Hematology and Oncology

## 2016-08-19 ENCOUNTER — Inpatient Hospital Stay (HOSPITAL_BASED_OUTPATIENT_CLINIC_OR_DEPARTMENT_OTHER): Payer: Medicare Other | Admitting: Hematology and Oncology

## 2016-08-19 VITALS — BP 106/62 | HR 62 | Temp 96.7°F | Resp 18 | Wt 188.6 lb

## 2016-08-19 DIAGNOSIS — D72819 Decreased white blood cell count, unspecified: Secondary | ICD-10-CM

## 2016-08-19 DIAGNOSIS — R61 Generalized hyperhidrosis: Secondary | ICD-10-CM

## 2016-08-19 DIAGNOSIS — R634 Abnormal weight loss: Secondary | ICD-10-CM

## 2016-08-19 DIAGNOSIS — Z79899 Other long term (current) drug therapy: Secondary | ICD-10-CM | POA: Diagnosis not present

## 2016-08-19 DIAGNOSIS — D509 Iron deficiency anemia, unspecified: Secondary | ICD-10-CM

## 2016-08-19 DIAGNOSIS — D638 Anemia in other chronic diseases classified elsewhere: Secondary | ICD-10-CM

## 2016-08-19 DIAGNOSIS — L03119 Cellulitis of unspecified part of limb: Secondary | ICD-10-CM | POA: Diagnosis not present

## 2016-08-19 DIAGNOSIS — L97511 Non-pressure chronic ulcer of other part of right foot limited to breakdown of skin: Secondary | ICD-10-CM | POA: Diagnosis not present

## 2016-08-19 DIAGNOSIS — D696 Thrombocytopenia, unspecified: Secondary | ICD-10-CM

## 2016-08-19 DIAGNOSIS — I2510 Atherosclerotic heart disease of native coronary artery without angina pectoris: Secondary | ICD-10-CM | POA: Diagnosis not present

## 2016-08-19 DIAGNOSIS — I480 Paroxysmal atrial fibrillation: Secondary | ICD-10-CM | POA: Diagnosis not present

## 2016-08-19 DIAGNOSIS — D649 Anemia, unspecified: Secondary | ICD-10-CM

## 2016-08-19 DIAGNOSIS — M069 Rheumatoid arthritis, unspecified: Secondary | ICD-10-CM

## 2016-08-19 DIAGNOSIS — L02619 Cutaneous abscess of unspecified foot: Secondary | ICD-10-CM | POA: Diagnosis not present

## 2016-08-19 DIAGNOSIS — F17210 Nicotine dependence, cigarettes, uncomplicated: Secondary | ICD-10-CM | POA: Diagnosis not present

## 2016-08-19 DIAGNOSIS — I10 Essential (primary) hypertension: Secondary | ICD-10-CM | POA: Diagnosis not present

## 2016-08-19 LAB — CBC WITH DIFFERENTIAL/PLATELET
Basophils Absolute: 0 10*3/uL (ref 0–0.1)
Basophils Relative: 1 %
Eosinophils Absolute: 0.1 10*3/uL (ref 0–0.7)
Eosinophils Relative: 4 %
HCT: 27.8 % — ABNORMAL LOW (ref 40.0–52.0)
Hemoglobin: 9.5 g/dL — ABNORMAL LOW (ref 13.0–18.0)
Lymphocytes Relative: 24 %
Lymphs Abs: 0.6 10*3/uL — ABNORMAL LOW (ref 1.0–3.6)
MCH: 30.8 pg (ref 26.0–34.0)
MCHC: 34.1 g/dL (ref 32.0–36.0)
MCV: 90.2 fL (ref 80.0–100.0)
Monocytes Absolute: 0.3 10*3/uL (ref 0.2–1.0)
Monocytes Relative: 13 %
Neutro Abs: 1.5 10*3/uL (ref 1.4–6.5)
Neutrophils Relative %: 58 %
Platelets: 143 10*3/uL — ABNORMAL LOW (ref 150–440)
RBC: 3.08 MIL/uL — ABNORMAL LOW (ref 4.40–5.90)
RDW: 13.1 % (ref 11.5–14.5)
WBC: 2.6 10*3/uL — ABNORMAL LOW (ref 3.8–10.6)

## 2016-08-19 LAB — RETICULOCYTES
RBC.: 3.17 MIL/uL — ABNORMAL LOW (ref 4.40–5.90)
Retic Count, Absolute: 34.9 10*3/uL (ref 19.0–183.0)
Retic Ct Pct: 1.1 % (ref 0.4–3.1)

## 2016-08-19 LAB — FERRITIN: Ferritin: 95 ng/mL (ref 24–336)

## 2016-08-19 NOTE — Progress Notes (Signed)
Patient has an ulcer on his right great toe.  Seeing podiatrist.  Currently on antibiotics.

## 2016-08-19 NOTE — Progress Notes (Signed)
[facility] day:  08/19/2016  Chief Complaint: Bryan [name] is a 76 y.o. male with anemia who is seen for 1 week assessment  HPI:  The patient was last seen in the hematology clinic on 08/13/2016.  At that time, interval scans revealed no evidence of lymphoma.  There was asymmetric left rectal wall thickening.  He was referred to gastroenterology.  The patient states he has a ulcer at the end of his right toe. He has been taking Bactrim twice a day. He is seeing a podiatrist.  He has been taking iron 325 mg daily with vitamin C. He sees Dr. Vicente Males, gastroenterologist, in a week and a half.  He denies any melena or hematochezia.  He was taken off his methotrexate secondary to his counts. He is unsure of other treatment options for his rheumatoid arthritis.  He has a follow-up with Dr. [name] on 08/2016.   Past Medical History:  Diagnosis Date  . Allergy   . Arrhythmia   . 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 Chronic back pain   . Complication of anesthesia    Memory loss 09/2015  . Coronary artery disease   . Depression   . Glaucoma   . H/O thoracic aortic aneurysm repair   . History of being hospitalized    memory lose kidney funtion down blood pressure up  . History of blood transfusion   . History of chicken pox   . Hyperlipidemia   . Hypertension   . Lumbar stenosis   . Paroxysmal a-fib ([place])   . Valvular heart disease     Past Surgical History:  Procedure Laterality Date  . AORTIC VALVE REPLACEMENT  2007   [facility].  [place], [place]  . APPENDECTOMY  2010  . CATARACT EXTRACTION W/ INTRAOCULAR LENS  IMPLANT, BILATERAL    . CHOLECYSTECTOMY  2010  . HAMMER TOE SURGERY Right 10/09/2015   Procedure: HAMMER TOE REPAIR WITH K-WIRE FIXATION RIGHT SECOND TOE;  Surgeon: Albertine Patricia, DPM;  Location: [place];  Service: Podiatry;  Laterality: Right;  WITH LOCAL  . LUMBAR  LAMINECTOMY/DECOMPRESSION MICRODISCECTOMY Right 03/08/2016   Procedure: Laminectomy and Foraminotomy - Lumbar Five-Sacral One Right;  Surgeon: Kary Kos, MD;  Location: [place];  Service: Neurosurgery;  Laterality: Right;  Right  . MULTIPLE TOOTH EXTRACTIONS    . REPLACEMENT TOTAL KNEE Left 2003  . THORACIC AORTIC ANEURYSM REPAIR  2010    Family History  Problem Relation Age of Onset  . Heart failure Mother   . Hypertension Mother   . Asthma Mother   . Heart attack Father 61    MI  . Hypertension Sister     Social History:  reports that he quit smoking about 35 years ago. His smoking use included Cigarettes. He has a 32.00 pack-year smoking history. He has never used smokeless tobacco. He reports that he drinks about 0.6 oz of alcohol per week . He reports that he does not use drugs.  He smoked 1 pack a day x 12 years.  He stopped smoking 30 years ago.  He drinks a glass of wine occasionally.  He is a Software engineer.  [place]  The patient is alone today.  Allergies:  Allergies  Allergen Reactions  . Penicillins Hives and Swelling    SWELLING REACTION UNSPECIFIED PATIENT HAS TAKEN AMOXICILLIN ON MED HX  FROM [facility] Has patient had a PCN reaction causing immediate rash, facial/tongue/throat swelling, SOB or lightheadedness with hypotension: Yes Has patient had a PCN reaction causing severe rash involving mucus membranes or skin necrosis: No Has patient had a PCN reaction that required hospitalization No Has patient had a PCN reaction occurring within the last 10 years: No If all of the above answers are "NO", then may proceed with  . Demerol [Meperidine] Hives and Nausea And Vomiting    Current Medications: Current Outpatient Prescriptions  Medication Sig Dispense Refill  . amLODipine (NORVASC) 5 MG tablet Take 1 tablet (5 mg total) by mouth daily. 90 tablet 3  . amoxicillin (AMOXIL) 500 MG capsule Take 2,000 mg by mouth as needed (prior  to dental procedures). Reported on 11/17/2015    . aspirin (GOODSENSE ASPIRIN) 325 MG tablet Take 325 mg by mouth daily.    . B Complex Vitamins (B COMPLEX-B12) TABS Take by mouth.    . brimonidine (ALPHAGAN) 0.2 % ophthalmic solution Place 1 drop into both eyes every morning.     . cetirizine (ZYRTEC) 10 MG tablet Take 10 mg by mouth daily.    . cholecalciferol (VITAMIN D) 400 units TABS tablet Take 400 Units by mouth daily.    . fenofibrate 160 MG tablet Take 1 tablet (160 mg total) by mouth daily. 90 tablet 1  . fentaNYL (DURAGESIC - DOSED MCG/HR) 25 MCG/HR patch Apply one patch to skin every 3 days if tolerated (Patient taking differently: Place 25 mcg onto the skin every 3 (three) days. Apply one patch to skin every 3 days if tolerated) 10 patch 0  . ferrous sulfate 325 (65 FE) MG tablet Take 325 mg by mouth daily with breakfast.    . FLUoxetine (PROZAC) 20 MG capsule TAKE 1 CAPSULE BY MOUTH EVERY DAY 90 capsule 0  . folic acid (FOLVITE) 1 MG tablet Take 1 mg by mouth 2 (two) times daily.    Marland Kitchen gabapentin (NEURONTIN) 800 MG tablet Limit 1 tablet by mouth 2-3 times per day if tolerated (Patient taking differently: Take 400 mg by mouth 3 (three) times daily. Limit 1 tablet by mouth 2-3 times per day if tolerated) 270 tablet 0  . hydrALAZINE (APRESOLINE) 25 MG tablet Take 1 tablet (25 mg total) by mouth 3 (three) times daily. 90 tablet 3  . HYDROcodone-acetaminophen (NORCO/VICODIN) 5-325 MG tablet Limit  2-4 tablets by mouth per day for breakthrough pain while wearing fentanyl patch if tolerated (Patient taking differently: Take 1 tablet by mouth 4 (four) times daily. Limit  2-4 tablets by mouth per day for breakthrough pain while wearing fentanyl patch if tolerated) 120 tablet 0  . hydroxychloroquine (PLAQUENIL) 200 MG tablet Take 200 mg by mouth daily.    Marland Kitchen lisinopril (PRINIVIL,ZESTRIL) 20 MG tablet Take 1 tablet (20 mg total) by mouth every evening. 90 tablet 3  . Magnesium 400 MG CAPS Take by  mouth.    . predniSONE (DELTASONE) 5 MG tablet Take 5 mg by mouth daily as needed. Reported on 11/17/2015    . simvastatin (ZOCOR) 40 MG tablet Take 1 tablet (40 mg total) by mouth at bedtime. 90 tablet 3  . sulfamethoxazole-trimethoprim (BACTRIM DS,SEPTRA DS) 800-160 MG tablet Take by mouth.    . tamsulosin (FLOMAX) 0.4 MG CAPS capsule TAKE 1 CAPSULE BY MOUTH EVERY DAY 90 capsule 1  . traZODone (DESYREL) 50 MG tablet Take 100 mg by mouth at bedtime as needed for sleep. Reported on 10/09/2015    .  vitamin B-12 (CYANOCOBALAMIN) 1000 MCG tablet Take 1,000 mcg by mouth daily.    . vitamin C (ASCORBIC ACID) 500 MG tablet Take 500 mg by mouth daily.     Current Facility-Administered Medications  Medication Dose Route Frequency Provider Last Rate Last Dose  . fentaNYL (SUBLIMAZE) injection 100 mcg  100 mcg Intravenous Once Mohammed Kindle, MD      . lactated ringers infusion 1,000 mL  1,000 mL Intravenous Continuous Mohammed Kindle, MD 125 mL/hr at 01/19/16 1011 1,000 mL at 01/19/16 1011  . midazolam (VERSED) 5 MG/5ML injection 5 mg  5 mg Intravenous Once Mohammed Kindle, MD        Review of Systems:  GENERAL:  Feels "ok".  No fevers.  Night sweats x years.  Weight loss of 16-17 pounds in 8 months (weight up 1 pound since last visit). PERFORMANCE STATUS (ECOG):  1 HEENT:  Yearly eye checks.   No visual changes, sore throat, mouth sores or tenderness. Lungs: Shortness of breath with exertion.  No cough.  No hemoptysis. Cardiac:  Aortic valve replacement.  No chest pain, palpitations, orthopnea, or PND. GI:  Irritable bowel.  No nausea, vomiting, diarrhea, constipation, melena or hematochezia. GU:  Frequent urination s/p Flomax.  No urgency,dysuria, or hematuria. Musculoskeletal:  Back problems s/p laminectomy in 03/2016 followed by epidural x 2.  Severe rheumatoid arthritis.  Stiff in morning.  No muscle tenderness. Extremities:  No pain or swelling. Skin:  After bath itching x 1 year.  Ulcer end of right  toe.  No rashes or skin changes. Neuro:  Cane for balance.  No headache, numbness or weakness, balance or coordination issues. Endocrine:  No diabetes, thyroid issues, hot flashes or night sweats. Psych:  No mood changes, depression or anxiety. Pain:  No focal pain. Review of systems:  All other systems reviewed and found to be negative.  Physical Exam: Blood pressure 106/62, pulse 62, temperature (!) 96.7 F (35.9 C), temperature source Tympanic, resp. rate 18, weight 188 lb 9 oz (85.5 kg). GENERAL:  Elderly gentleman, sitting comfortably in the exam room in no acute distress.  He has a cane at his side. MENTAL STATUS:  Alert and oriented to person, place and time. HEAD:  Gray/white hair.  Thin beard.  Normocephalic, atraumatic, face symmetric, no Cushingoid features. EYES:  Hazel eyes.  No conjunctivitis or scleral icterus. ENT:  Oropharynx clear without lesion.  Tongue normal. Mucous membranes moist.  RESPIRATORY:  Clear to auscultation without rales, wheezes or rhonchi. CARDIOVASCULAR:  Regular rate and rhythm without murmur, rub or gallop. ABDOMEN:  Soft, non-tender, with active bowel sounds, and no hepatosplenomegaly.  No masses. SKIN:  No rashes, ulcers or lesions. EXTREMITIES: Ulnar deviation.  No edema, no skin discoloration or tenderness.  No palpable cords. LYMPH NODES: No palpable cervical, supraclavicular, axillary or inguinal adenopathy. NEUROLOGICAL: Unremarkable. PSYCH:  Appropriate.   Appointment on 08/19/2016  Component Date Value Ref Range Status  . Retic Ct Pct 08/19/2016 1.1  0.4 - 3.1 % Final  . RBC. 08/19/2016 3.17* 4.40 - 5.90 MIL/uL Final  . Retic Count, Manual 08/19/2016 34.9  19.0 - 183.0 K/uL Final  . WBC 08/19/2016 2.6* 3.8 - 10.6 K/uL Final  . RBC 08/19/2016 3.08* 4.40 - 5.90 MIL/uL Final  . Hemoglobin 08/19/2016 9.5* 13.0 - 18.0 g/dL Final  . HCT 08/19/2016 27.8* 40.0 - 52.0 % Final  . MCV 08/19/2016 90.2  80.0 - 100.0 fL Final  . MCH 08/19/2016 30.8   26.0 - 34.0 pg  Final  . MCHC 08/19/2016 34.1  32.0 - 36.0 g/dL Final  . RDW 08/19/2016 13.1  11.5 - 14.5 % Final  . Platelets 08/19/2016 143* 150 - 440 K/uL Final  . Neutrophils Relative % 08/19/2016 58  % Final  . Neutro Abs 08/19/2016 1.5  1.4 - 6.5 K/uL Final  . Lymphocytes Relative 08/19/2016 24  % Final  . Lymphs Abs 08/19/2016 0.6* 1.0 - 3.6 K/uL Final  . Monocytes Relative 08/19/2016 13  % Final  . Monocytes Absolute 08/19/2016 0.3  0.2 - 1.0 K/uL Final  . Eosinophils Relative 08/19/2016 4  % Final  . Eosinophils Absolute 08/19/2016 0.1  0 - 0.7 K/uL Final  . Basophils Relative 08/19/2016 1  % Final  . Basophils Absolute 08/19/2016 0.0  0 - 0.1 K/uL Final    Assessment:  Bryan [name] is a 76 y.o. male with rheumatoid arthritis and progressive anemia over the past 2 years.  He has been on Plaquenil and hydralazine which can cause anemia.  He denies any exposure to radiation or toxins.  He denies any prior history of hepatitis, prior transfusions or HIV risk factors.  He denies any herbal products.  Diet is good.  Colonoscopy was negative 8-10 years ago. Guaiac cards were negative x 2 in 06/2016.  He denies any melena or hematochezia. He denies any hematuria.  CBC on 06/02/2016 revealed a hematocrit of 29.5, hemoglobin 10.0, MCV 92.3, platelets 155,000, and WBC 4200.  Stool was guaiac negative x 2 in 06/2016.  Labs on 06/04/2016 revealed a ferritin of 124, iron saturation 22% and TIBC of 370.  B12 was 883 on 06/18/2014.  Work-up on 07/19/2016 revealed a hematocrit of 31.7, hemoglobin 10.6, MCV 91, platelets 143,000, white count 3000 with an ANC of 1800.  Absolute lymphocyte count was 700 (low). Creatinine was 1.57.  LDH was 269 (98-192).  Normal labs included:  uric acid, folate, Coombs, and SPEP.  Iron studies included a saturation of 11% (low) and a TIBC of 454 (high) c/w iron deficiency anemia.  Sed rate was 19.  Retic was 0.9% (low).  He is on oral iron with vitamin  C.  Chest, abdomen, and pelvic CT on 08/09/2016 revealed no acute findings or clear evidence of malignancy in the chest, abdomen or pelvis.  There was asymmetric left rectal wall thickening possibly secondary to volume averaging with the prostate gland.  Symptomatically, he has arthritis pain.  He has an ulcer at the end of his right toe.  He is on Septra BID.  He has mild pancytopenia.  Hematocrit is 27.7, hemoglobin 9.5, platelets 143,000, and WBC 2600 with an ANC of 1500.  Plan: 1.  Labs today:  CBC with diff, copper, retic. 2.  Discuss counts.  Discuss consideration of bone marrow aspirate and biopsy if counts do not improve. 3.  Preauth Venofer 4.  If iron studies lower today, begin weekly IV iron x 3 5.  CBC in 2 weeks. 6.  RTC in 3 weeks for MD assessment and labs (CBC with diff +/- others).   Lequita Asal, MD  08/19/2016, 10:56 AM

## 2016-08-21 LAB — COPPER, SERUM: Copper: 117 ug/dL (ref 72–166)

## 2016-08-23 DIAGNOSIS — L02619 Cutaneous abscess of unspecified foot: Secondary | ICD-10-CM | POA: Diagnosis not present

## 2016-08-23 DIAGNOSIS — L03119 Cellulitis of unspecified part of limb: Secondary | ICD-10-CM | POA: Diagnosis not present

## 2016-08-23 DIAGNOSIS — G609 Hereditary and idiopathic neuropathy, unspecified: Secondary | ICD-10-CM | POA: Diagnosis not present

## 2016-08-23 DIAGNOSIS — L97511 Non-pressure chronic ulcer of other part of right foot limited to breakdown of skin: Secondary | ICD-10-CM | POA: Diagnosis not present

## 2016-08-24 ENCOUNTER — Other Ambulatory Visit: Payer: Medicare Other

## 2016-08-24 ENCOUNTER — Ambulatory Visit: Payer: Medicare Other | Admitting: Oncology

## 2016-08-25 DIAGNOSIS — G894 Chronic pain syndrome: Secondary | ICD-10-CM | POA: Diagnosis not present

## 2016-08-25 DIAGNOSIS — Z79891 Long term (current) use of opiate analgesic: Secondary | ICD-10-CM | POA: Diagnosis not present

## 2016-08-30 DIAGNOSIS — I272 Pulmonary hypertension, unspecified: Secondary | ICD-10-CM | POA: Diagnosis not present

## 2016-08-30 DIAGNOSIS — I10 Essential (primary) hypertension: Secondary | ICD-10-CM | POA: Diagnosis not present

## 2016-08-30 DIAGNOSIS — L97511 Non-pressure chronic ulcer of other part of right foot limited to breakdown of skin: Secondary | ICD-10-CM | POA: Diagnosis not present

## 2016-08-30 DIAGNOSIS — N183 Chronic kidney disease, stage 3 (moderate): Secondary | ICD-10-CM | POA: Diagnosis not present

## 2016-09-01 ENCOUNTER — Encounter: Payer: Self-pay | Admitting: Family Medicine

## 2016-09-01 ENCOUNTER — Ambulatory Visit (INDEPENDENT_AMBULATORY_CARE_PROVIDER_SITE_OTHER): Payer: Medicare Other | Admitting: Family Medicine

## 2016-09-01 ENCOUNTER — Other Ambulatory Visit: Payer: Self-pay | Admitting: Family Medicine

## 2016-09-01 ENCOUNTER — Ambulatory Visit: Payer: Medicare Other | Admitting: Family Medicine

## 2016-09-01 VITALS — BP 146/72 | HR 61 | Temp 98.2°F | Wt 187.4 lb

## 2016-09-01 DIAGNOSIS — W19XXXA Unspecified fall, initial encounter: Secondary | ICD-10-CM | POA: Diagnosis not present

## 2016-09-01 DIAGNOSIS — D649 Anemia, unspecified: Secondary | ICD-10-CM | POA: Diagnosis not present

## 2016-09-01 DIAGNOSIS — Z96652 Presence of left artificial knee joint: Secondary | ICD-10-CM | POA: Diagnosis not present

## 2016-09-01 DIAGNOSIS — K6289 Other specified diseases of anus and rectum: Secondary | ICD-10-CM | POA: Diagnosis not present

## 2016-09-01 DIAGNOSIS — M5136 Other intervertebral disc degeneration, lumbar region: Secondary | ICD-10-CM | POA: Diagnosis not present

## 2016-09-01 NOTE — Assessment & Plan Note (Signed)
Patient with pain in bilateral knees. History of left knee replacement. We will refer to orthopedics.

## 2016-09-01 NOTE — Assessment & Plan Note (Signed)
Patient has evaluation with GI for rectal wall thickening seen on CT scan. Encouraged him to keep this appointment and to have the nodule evaluated as well.

## 2016-09-01 NOTE — Patient Instructions (Signed)
Nice to see you. Please keep the appointment with the GI physician and for lab work tomorrow. We will get you to see orthopedics. If you have worsening pain redevelop numbness, weakness, loss of bowel or bladder function, numbness between her legs, blood in her stool, or any new or change in symptoms please seek medical attention medially.

## 2016-09-01 NOTE — Progress Notes (Signed)
[name], MD Phone: [phone]  Bryan [name] is a 76 y.o. male who presents today for follow-up.  Patient has been following with hematology/oncology for anemia and has been on iron supplementation. He has follow-up lab work tomorrow. They identified weight loss and night sweats and itching that were worrisome and proceeded with evaluation for lymphoma. He had CT abdomen and pelvis and chest that did not reveal any lymphoma though did reveal some rectal wall thickening. He has a known rectal nodule which he decided not to get worked up previously. He has an appointment with GI for evaluation of these things early next week. He notes his weight loss is stabilized. He continues to itch though he is on hydrocodone and fentanyl. His PSAs have been normal. He's had normal testosterone checks. His lab work has all been reassuring.  He notes continued issues with his back and knees. He follows with neurosurgery for his back and has had several epidurals. Continues to get pain radiating to his right leg. Neurosurgery is going to take over his pain medication. His knees have been bothering him as well with discomfort mostly in the mornings. His left knee has been replaced. He has not seen orthopedics since moving to [place]. He notes no weakness. He has chronic neuropathy though no other numbness. No saddle anesthesia or bowel or bladder incontinence.  He does report he was walking the dogs sometime last week in the rain and then slipped. He fell backwards and caught himself. He notes no loss of consciousness or head injury. He notes no injuries related to this.  PMH: former smoker   ROS see history of present illness  Objective  Physical Exam Vitals:   09/01/16 1505  BP: (!) 146/72  Pulse: 61  Temp: 98.2 F (36.8 C)    BP Readings from Last 3 Encounters:  09/01/16 (!) 146/72  08/19/16 106/62  08/13/16 109/60   Wt Readings from Last 3 Encounters:  09/01/16 187 lb 6.4 oz  (85 kg)  08/19/16 188 lb 9 oz (85.5 kg)  08/13/16 187 lb (84.8 kg)    Physical Exam  Constitutional: No distress.  Cardiovascular: Normal rate and regular rhythm.   Pulmonary/Chest: Effort normal and breath sounds normal.  Musculoskeletal: He exhibits no edema.  Bilateral knees is no warmth, erythema, or tenderness, no effusions noted, left knee with midline anterior scar noted  Neurological: He is alert.  Skin: Skin is warm and dry. He is not diaphoretic.     Assessment/Plan: Please see individual problem list.  Rectal nodule Patient has evaluation with GI for rectal wall thickening seen on CT scan. Encouraged him to keep this appointment and to have the nodule evaluated as well.  DDD (degenerative disc disease), lumbar Persistently has issues with this. He'll follow up with neurosurgery for pain management and further recommendations.  Anemia He is following with hematology for this. He has had quite a bit of workup regarding his anemia and his weight loss and night sweats and itching. Discussed that his itching may be related to his pain medication. His weight loss has improved. He has follow-up lab work tomorrow through hematology/oncology. He'll continue to follow with them. Discussed that he needs evaluation with GI given his weight loss to determine if he needs colonoscopy. He will continue to monitor his symptoms.  History of left knee replacement Patient with pain in bilateral knees. History of left knee replacement. We will refer to orthopedics.  Fall Patient with a single fall last week. Suspect  multifactorial in nature given his chronic pain from his knees and back, the wet surface he was walking on, and the fact that it was dark outside. No injury related to this. Discussed being careful in the future and will have him evaluated for his back pain by neurosurgery as scheduled and by an orthopedic doctor for his knees.   Orders Placed This Encounter  Procedures  .  Ambulatory referral to Orthopedic Surgery    Referral Priority:   Routine    Referral Type:   Surgical    Referral Reason:   Specialty Services Required    Requested Specialty:   Orthopedic Surgery    Number of Visits Requested:   1    [name], MD [place]

## 2016-09-01 NOTE — Assessment & Plan Note (Signed)
Persistently has issues with this. He'll follow up with neurosurgery for pain management and further recommendations.

## 2016-09-01 NOTE — Progress Notes (Signed)
Pre visit review using our clinic review tool, if applicable. No additional management support is needed unless otherwise documented below in the visit note. 

## 2016-09-01 NOTE — Assessment & Plan Note (Addendum)
He is following with hematology for this. He has had quite a bit of workup regarding his anemia and his weight loss and night sweats and itching. Discussed that his itching may be related to his pain medication. His weight loss has improved. He has follow-up lab work tomorrow through hematology/oncology. He'll continue to follow with them. Discussed that he needs evaluation with GI given his weight loss to determine if he needs colonoscopy. He will continue to monitor his symptoms.

## 2016-09-01 NOTE — Assessment & Plan Note (Signed)
Patient with a single fall last week. Suspect multifactorial in nature given his chronic pain from his knees and back, the wet surface he was walking on, and the fact that it was dark outside. No injury related to this. Discussed being careful in the future and will have him evaluated for his back pain by neurosurgery as scheduled and by an orthopedic doctor for his knees.

## 2016-09-02 ENCOUNTER — Inpatient Hospital Stay: Payer: Medicare Other | Attending: Hematology and Oncology

## 2016-09-02 ENCOUNTER — Other Ambulatory Visit: Payer: Self-pay

## 2016-09-02 DIAGNOSIS — I10 Essential (primary) hypertension: Secondary | ICD-10-CM | POA: Diagnosis not present

## 2016-09-02 DIAGNOSIS — I2510 Atherosclerotic heart disease of native coronary artery without angina pectoris: Secondary | ICD-10-CM | POA: Insufficient documentation

## 2016-09-02 DIAGNOSIS — I480 Paroxysmal atrial fibrillation: Secondary | ICD-10-CM | POA: Insufficient documentation

## 2016-09-02 DIAGNOSIS — M48061 Spinal stenosis, lumbar region without neurogenic claudication: Secondary | ICD-10-CM | POA: Diagnosis not present

## 2016-09-02 DIAGNOSIS — L97519 Non-pressure chronic ulcer of other part of right foot with unspecified severity: Secondary | ICD-10-CM | POA: Diagnosis not present

## 2016-09-02 DIAGNOSIS — Z7982 Long term (current) use of aspirin: Secondary | ICD-10-CM | POA: Insufficient documentation

## 2016-09-02 DIAGNOSIS — D61818 Other pancytopenia: Secondary | ICD-10-CM | POA: Diagnosis not present

## 2016-09-02 DIAGNOSIS — D509 Iron deficiency anemia, unspecified: Secondary | ICD-10-CM

## 2016-09-02 DIAGNOSIS — Z952 Presence of prosthetic heart valve: Secondary | ICD-10-CM | POA: Insufficient documentation

## 2016-09-02 DIAGNOSIS — F17210 Nicotine dependence, cigarettes, uncomplicated: Secondary | ICD-10-CM | POA: Diagnosis not present

## 2016-09-02 DIAGNOSIS — M069 Rheumatoid arthritis, unspecified: Secondary | ICD-10-CM | POA: Diagnosis not present

## 2016-09-02 DIAGNOSIS — Z79899 Other long term (current) drug therapy: Secondary | ICD-10-CM | POA: Insufficient documentation

## 2016-09-02 DIAGNOSIS — E785 Hyperlipidemia, unspecified: Secondary | ICD-10-CM | POA: Diagnosis not present

## 2016-09-02 DIAGNOSIS — F329 Major depressive disorder, single episode, unspecified: Secondary | ICD-10-CM | POA: Diagnosis not present

## 2016-09-02 DIAGNOSIS — H409 Unspecified glaucoma: Secondary | ICD-10-CM | POA: Insufficient documentation

## 2016-09-02 LAB — CBC WITH DIFFERENTIAL/PLATELET
Basophils Absolute: 0 10*3/uL (ref 0–0.1)
Basophils Relative: 1 %
Eosinophils Absolute: 0.1 10*3/uL (ref 0–0.7)
Eosinophils Relative: 3 %
HCT: 30.6 % — ABNORMAL LOW (ref 40.0–52.0)
Hemoglobin: 10.5 g/dL — ABNORMAL LOW (ref 13.0–18.0)
Lymphocytes Relative: 26 %
Lymphs Abs: 0.9 10*3/uL — ABNORMAL LOW (ref 1.0–3.6)
MCH: 30.8 pg (ref 26.0–34.0)
MCHC: 34.3 g/dL (ref 32.0–36.0)
MCV: 89.7 fL (ref 80.0–100.0)
Monocytes Absolute: 0.4 10*3/uL (ref 0.2–1.0)
Monocytes Relative: 12 %
Neutro Abs: 1.9 10*3/uL (ref 1.4–6.5)
Neutrophils Relative %: 58 %
Platelets: 135 10*3/uL — ABNORMAL LOW (ref 150–440)
RBC: 3.42 MIL/uL — ABNORMAL LOW (ref 4.40–5.90)
RDW: 13.6 % (ref 11.5–14.5)
WBC: 3.3 10*3/uL — ABNORMAL LOW (ref 3.8–10.6)

## 2016-09-09 ENCOUNTER — Inpatient Hospital Stay: Payer: Medicare Other

## 2016-09-09 ENCOUNTER — Telehealth: Payer: Self-pay | Admitting: *Deleted

## 2016-09-09 ENCOUNTER — Inpatient Hospital Stay (HOSPITAL_BASED_OUTPATIENT_CLINIC_OR_DEPARTMENT_OTHER): Payer: Medicare Other | Admitting: Hematology and Oncology

## 2016-09-09 ENCOUNTER — Encounter: Payer: Self-pay | Admitting: Hematology and Oncology

## 2016-09-09 VITALS — BP 100/52 | HR 64 | Temp 97.7°F | Resp 18 | Wt 185.1 lb

## 2016-09-09 DIAGNOSIS — D509 Iron deficiency anemia, unspecified: Secondary | ICD-10-CM

## 2016-09-09 DIAGNOSIS — H409 Unspecified glaucoma: Secondary | ICD-10-CM | POA: Diagnosis not present

## 2016-09-09 DIAGNOSIS — E785 Hyperlipidemia, unspecified: Secondary | ICD-10-CM | POA: Diagnosis not present

## 2016-09-09 DIAGNOSIS — Z79899 Other long term (current) drug therapy: Secondary | ICD-10-CM | POA: Diagnosis not present

## 2016-09-09 DIAGNOSIS — I10 Essential (primary) hypertension: Secondary | ICD-10-CM | POA: Diagnosis not present

## 2016-09-09 DIAGNOSIS — M069 Rheumatoid arthritis, unspecified: Secondary | ICD-10-CM | POA: Diagnosis not present

## 2016-09-09 DIAGNOSIS — D649 Anemia, unspecified: Secondary | ICD-10-CM

## 2016-09-09 DIAGNOSIS — D61818 Other pancytopenia: Secondary | ICD-10-CM

## 2016-09-09 DIAGNOSIS — F17210 Nicotine dependence, cigarettes, uncomplicated: Secondary | ICD-10-CM | POA: Diagnosis not present

## 2016-09-09 LAB — IRON AND TIBC
Iron: 79 ug/dL (ref 45–182)
Saturation Ratios: 23 % (ref 17.9–39.5)
TIBC: 350 ug/dL (ref 250–450)
UIBC: 271 ug/dL

## 2016-09-09 LAB — CBC WITH DIFFERENTIAL/PLATELET
Basophils Absolute: 0 10*3/uL (ref 0–0.1)
Basophils Relative: 1 %
Eosinophils Absolute: 0.1 10*3/uL (ref 0–0.7)
Eosinophils Relative: 4 %
HCT: 28.9 % — ABNORMAL LOW (ref 40.0–52.0)
Hemoglobin: 9.9 g/dL — ABNORMAL LOW (ref 13.0–18.0)
Lymphocytes Relative: 34 %
Lymphs Abs: 0.8 10*3/uL — ABNORMAL LOW (ref 1.0–3.6)
MCH: 30.9 pg (ref 26.0–34.0)
MCHC: 34.2 g/dL (ref 32.0–36.0)
MCV: 90.3 fL (ref 80.0–100.0)
Monocytes Absolute: 0.3 10*3/uL (ref 0.2–1.0)
Monocytes Relative: 13 %
Neutro Abs: 1.2 10*3/uL — ABNORMAL LOW (ref 1.4–6.5)
Neutrophils Relative %: 48 %
Platelets: 128 10*3/uL — ABNORMAL LOW (ref 150–440)
RBC: 3.2 MIL/uL — ABNORMAL LOW (ref 4.40–5.90)
RDW: 13.9 % (ref 11.5–14.5)
WBC: 2.5 10*3/uL — ABNORMAL LOW (ref 3.8–10.6)

## 2016-09-09 LAB — FERRITIN: Ferritin: 110 ng/mL (ref 24–336)

## 2016-09-09 LAB — PSA: PSA: <0.01 ng/mL (ref 0.00–4.00)

## 2016-09-09 LAB — SEDIMENTATION RATE: Sed Rate: 15 mm/hr (ref 0–20)

## 2016-09-09 NOTE — Telephone Encounter (Signed)
Called patient to inform him that per MD he does not need IV iron.  Patient verbalized understanding.

## 2016-09-09 NOTE — Telephone Encounter (Signed)
Called patient to inform him that PSA is good.

## 2016-09-09 NOTE — Progress Notes (Signed)
Patient does not understand why his blood work is ordered the way it is.  Wants to discuss with MD.

## 2016-09-09 NOTE — Telephone Encounter (Signed)
-----   Message from Lequita Asal, MD sent at 09/09/2016  3:40 PM EDT ----- Regarding: Please call patient  PSA good.  M  ----- Message ----- From: Interface, Lab In [place] Sent: 09/09/2016   9:14 AM To: Lequita Asal, MD

## 2016-09-09 NOTE — Progress Notes (Signed)
[facility] day:  09/09/2016  Chief Complaint: Bryan [name] is a 76 y.o. male with rheumatoid arthritis, iron deficiency anemia, and mild pancytopenia who is seen for 3 week assessment  HPI:  The patient was last seen in the hematology clinic on 08/19/2016.  At that time, he had ongoing arthritis pain.  He had an ulcer at the end of his right toe.  He was on Septra BID.  Concern was raised regarding myelosuppression caused by Septra.  He had mild pancytopenia with a hematocrit of 27.8, hemoglobin 9.5, MCV 90.2, platelets 143,000, WBC 2600 with an ANC of 1500.  Ferritin was 95.  Copper was 117 (normal).  Septra was changed to doxycycline on 08/19/2016.  The patient was seen by Dr. 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  Wound was debrided.  Plan was to continue antibiotics for an additional week.  The patient states that he has appointment with Dr.Kiran Vicente Males, gastroenterologist, on 09/13/2016. He denies any bleeding. He continues to feel tired and worn out. He continues to have sweats and after bath itching.   He was taken off Plaquenil by Dr. [name] on 08/26/2016.  He notes severe pain in the morning.  He has follow-up on 09/23/2016. He continues on 1 iron tablet a day.   Past Medical History:  Diagnosis Date  . Allergy   . Arrhythmia   . 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 Chronic back pain   . Complication of anesthesia    Memory loss 09/2015  . Coronary artery disease   . Depression   . Glaucoma   . H/O thoracic aortic aneurysm repair   . History of being hospitalized    memory lose kidney funtion down blood pressure up  . History of blood transfusion   . History of chicken pox   . Hyperlipidemia   . Hypertension   . Lumbar stenosis   . Paroxysmal A-fib ([place])   . Valvular heart disease     Past Surgical History:  Procedure Laterality Date  . AORTIC  VALVE REPLACEMENT  2007   [facility].  [place], [place]  . APPENDECTOMY  2010  . CATARACT EXTRACTION W/ INTRAOCULAR LENS  IMPLANT, BILATERAL    . CHOLECYSTECTOMY  2010  . HAMMER TOE SURGERY Right 10/09/2015   Procedure: HAMMER TOE REPAIR WITH K-WIRE FIXATION RIGHT SECOND TOE;  Surgeon: Albertine Patricia, DPM;  Location: [facility];  Service: Podiatry;  Laterality: Right;  WITH LOCAL  . LUMBAR LAMINECTOMY/DECOMPRESSION MICRODISCECTOMY Right 03/08/2016   Procedure: Laminectomy and Foraminotomy - Lumbar Five-Sacral One Right;  Surgeon: Kary Kos, MD;  Location: [facility];  Service: Neurosurgery;  Laterality: Right;  Right  . MULTIPLE TOOTH EXTRACTIONS    . REPLACEMENT TOTAL KNEE Left 2003  . THORACIC AORTIC ANEURYSM REPAIR  2010    Family History  Problem Relation Age of Onset  . Heart failure Mother   . Hypertension Mother   . Asthma Mother   . Heart attack Father 5    MI  . Hypertension Sister     Social History:  reports that he quit smoking about 35 years ago. His smoking use included Cigarettes. He has a 32.00 pack-year smoking history. He has never used smokeless tobacco. He reports that he drinks about 0.6 oz of alcohol per week . He reports that he does not use drugs.  He  smoked 1 pack a day x 12 years.  He stopped smoking 30 years ago.  He drinks a glass of wine occasionally.  He is a Software engineer.  [place]  The patient is alone today.  Allergies:  Allergies  Allergen Reactions  . Penicillins Hives and Swelling    SWELLING REACTION UNSPECIFIED PATIENT HAS TAKEN AMOXICILLIN ON MED HX FROM [facility] Has patient had a PCN reaction causing immediate rash, facial/tongue/throat swelling, SOB or lightheadedness with hypotension: Yes Has patient had a PCN reaction causing severe rash involving mucus membranes or skin necrosis: No Has patient had a PCN reaction that required hospitalization No Has patient had a PCN reaction occurring  within the last 10 years: No If all of the above answers are "NO", then may proceed with  . Demerol [Meperidine] Hives and Nausea And Vomiting    Current Medications: Current Outpatient Prescriptions  Medication Sig Dispense Refill  . amLODipine (NORVASC) 5 MG tablet Take 1 tablet (5 mg total) by mouth daily. 90 tablet 3  . aspirin (GOODSENSE ASPIRIN) 325 MG tablet Take 325 mg by mouth daily.    Marland Kitchen b complex vitamins tablet Take 1 tablet by mouth daily.    . brimonidine (ALPHAGAN) 0.2 % ophthalmic solution Place 1 drop into both eyes every morning.     . cetirizine (ZYRTEC) 10 MG tablet Take 10 mg by mouth daily.    . cholecalciferol (VITAMIN D) 400 units TABS tablet Take 400 Units by mouth daily.    Marland Kitchen doxycycline (VIBRA-TABS) 100 MG tablet Take 100 mg by mouth 2 (two) times daily.    . fenofibrate 160 MG tablet Take 1 tablet (160 mg total) by mouth daily. 90 tablet 1  . fentaNYL (DURAGESIC - DOSED MCG/HR) 25 MCG/HR patch Apply one patch to skin every 3 days if tolerated (Patient taking differently: Place 25 mcg onto the skin every 3 (three) days. Apply one patch to skin every 3 days if tolerated) 10 patch 0  . ferrous sulfate 325 (65 FE) MG tablet Take 325 mg by mouth daily with breakfast.    . FLUoxetine (PROZAC) 20 MG capsule TAKE 1 CAPSULE BY MOUTH EVERY DAY 90 capsule 1  . folic acid (FOLVITE) 1 MG tablet Take 1 mg by mouth 2 (two) times daily.    Marland Kitchen gabapentin (NEURONTIN) 800 MG tablet Limit 1 tablet by mouth 2-3 times per day if tolerated (Patient taking differently: Take 400 mg by mouth 3 (three) times daily. Limit 1 tablet by mouth 2-3 times per day if tolerated) 270 tablet 0  . hydrALAZINE (APRESOLINE) 25 MG tablet Take 1 tablet (25 mg total) by mouth 3 (three) times daily. 90 tablet 3  . HYDROcodone-acetaminophen (NORCO/VICODIN) 5-325 MG tablet Limit  2-4 tablets by mouth per day for breakthrough pain while wearing fentanyl patch if tolerated (Patient taking differently: Take 1 tablet  by mouth 4 (four) times daily. Limit  2-4 tablets by mouth per day for breakthrough pain while wearing fentanyl patch if tolerated) 120 tablet 0  . hydroxychloroquine (PLAQUENIL) 200 MG tablet Take 200 mg by mouth daily.    Marland Kitchen lisinopril (PRINIVIL,ZESTRIL) 20 MG tablet Take 1 tablet (20 mg total) by mouth every evening. 90 tablet 3  . Magnesium 400 MG CAPS Take by mouth.    . simvastatin (ZOCOR) 40 MG tablet Take 1 tablet (40 mg total) by mouth at bedtime. 90 tablet 3  . tamsulosin (FLOMAX) 0.4 MG CAPS capsule  TAKE 1 CAPSULE BY MOUTH EVERY DAY 90 capsule 1  . traZODone (DESYREL) 50 MG tablet Take 100 mg by mouth at bedtime as needed for sleep. Reported on 10/09/2015    . vitamin B-12 (CYANOCOBALAMIN) 1000 MCG tablet Take 1,000 mcg by mouth daily.    . vitamin C (ASCORBIC ACID) 500 MG tablet Take 500 mg by mouth daily.    Marland Kitchen amoxicillin (AMOXIL) 500 MG capsule Take 2,000 mg by mouth as needed (prior to dental procedures). Reported on 11/17/2015    . predniSONE (DELTASONE) 5 MG tablet Take 5 mg by mouth daily as needed. Reported on 11/17/2015     Current Facility-Administered Medications  Medication Dose Route Frequency Provider Last Rate Last Dose  . fentaNYL (SUBLIMAZE) injection 100 mcg  100 mcg Intravenous Once Mohammed Kindle, MD      . lactated ringers infusion 1,000 mL  1,000 mL Intravenous Continuous Mohammed Kindle, MD 125 mL/hr at 01/19/16 1011 1,000 mL at 01/19/16 1011  . midazolam (VERSED) 5 MG/5ML injection 5 mg  5 mg Intravenous Once Mohammed Kindle, MD        Review of Systems:  GENERAL:  Feels tired and worn out.  No fevers.  Night sweats.  Weight loss of 3 pounds since last visit. PERFORMANCE STATUS (ECOG):  1 HEENT:  No visual changes, sore throat, mouth sores or tenderness. Lungs: Shortness of breath with exertion.  No cough.  No hemoptysis. Cardiac:  Aortic valve replacement.  No chest pain, palpitations, orthopnea, or PND. GI:  Irritable bowel.  No nausea, vomiting, diarrhea,  constipation, melena or hematochezia. GU:  No urgency,dysuria, or hematuria. Musculoskeletal:  Chronic back problems.  Knee pain.  Severe rheumatoid arthritis.  Stiff in morning.  No muscle tenderness. Extremities:  No pain or swelling. Skin:  After bath itching.  No rashes or skin changes. Neuro:  Cane for balance.  No headache, numbness or weakness, balance or coordination issues. Endocrine:  No diabetes, thyroid issues, hot flashes or night sweats. Psych:  No mood changes, depression or anxiety. Pain:  No focal pain. Review of systems:  All other systems reviewed and found to be negative.  Physical Exam: Blood pressure (!) 100/52, pulse 64, temperature 97.7 F (36.5 C), temperature source Tympanic, resp. rate 18, weight 185 lb 1 oz (83.9 kg). GENERAL:  Elderly gentleman, sitting comfortably in the exam room in no acute distress. MENTAL STATUS:  Alert and oriented to person, place and time. HEAD:  Gray/white hair.  Thin beard.  Normocephalic, atraumatic, face symmetric, no Cushingoid features. EYES:  Hazel eyes.  No conjunctivitis or scleral icterus. ENT:  Oropharynx clear without lesion.  Tongue normal. Mucous membranes moist.  NEUROLOGICAL: Unremarkable. PSYCH:  Appropriate.   Appointment on 09/09/2016  Component Date Value Ref Range Status  . WBC 09/09/2016 2.5* 3.8 - 10.6 K/uL Final  . RBC 09/09/2016 3.20* 4.40 - 5.90 MIL/uL Final  . Hemoglobin 09/09/2016 9.9* 13.0 - 18.0 g/dL Final  . HCT 09/09/2016 28.9* 40.0 - 52.0 % Final  . MCV 09/09/2016 90.3  80.0 - 100.0 fL Final  . MCH 09/09/2016 30.9  26.0 - 34.0 pg Final  . MCHC 09/09/2016 34.2  32.0 - 36.0 g/dL Final  . RDW 09/09/2016 13.9  11.5 - 14.5 % Final  . Platelets 09/09/2016 128* 150 - 440 K/uL Final  . Neutrophils Relative % 09/09/2016 48  % Final  . Neutro Abs 09/09/2016 1.2* 1.4 - 6.5 K/uL Final  . Lymphocytes Relative 09/09/2016 34  % Final  .  Lymphs Abs 09/09/2016 0.8* 1.0 - 3.6 K/uL Final  . Monocytes Relative  09/09/2016 13  % Final  . Monocytes Absolute 09/09/2016 0.3  0.2 - 1.0 K/uL Final  . Eosinophils Relative 09/09/2016 4  % Final  . Eosinophils Absolute 09/09/2016 0.1  0 - 0.7 K/uL Final  . Basophils Relative 09/09/2016 1  % Final  . Basophils Absolute 09/09/2016 0.0  0 - 0.1 K/uL Final    Assessment:  Bryan [name] is a 76 y.o. male with rheumatoid arthritis and progressive anemia over the past 2 years.  He has been on Plaquenil and hydralazine which can cause anemia.  Plaquenil was discontinued on 08/26/2016.  He denies any exposure to radiation or toxins.  He denies any prior history of hepatitis, prior transfusions or HIV risk factors.  He denies any herbal products.  Diet is good.  Colonoscopy was negative 8-10 years ago. Guaiac cards were negative x 2 in 06/2016.  He denies any melena or hematochezia. He denies any hematuria.  CBC on 06/02/2016 revealed a hematocrit of 29.5, hemoglobin 10.0, MCV 92.3, platelets 155,000, and WBC 4200.  Stool was guaiac negative x 2 in 06/2016.  Labs on 06/04/2016 revealed a ferritin of 124, iron saturation 22% and TIBC of 370.  B12 was 883 on 06/18/2014.  Work-up on 07/19/2016 revealed a hematocrit of 31.7, hemoglobin 10.6, MCV 91, platelets 143,000, white count 3000 with an ANC of 1800.  Absolute lymphocyte count was 700 (low). Creatinine was 1.57.  LDH was 269 (98-192).  Normal labs included:  uric acid, folate, [place], SPEP, and copper.  Iron studies included a saturation of 11% (low) and a TIBC of 454 (high) c/w iron deficiency anemia.  Sed rate was 19.  Retic was 0.9% (low).  He is on oral iron with vitamin C.  Chest, abdomen, and pelvic CTon 08/09/2016 revealed no acute findings or clear evidence of malignancy in the chest, abdomen or pelvis. There was asymmetric left rectal wall thickeningpossibly secondary to volume averaging with the prostate gland.  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  He has treated with Septra then  switched to doxycycline.  Symptomatically, he has arthritis pain.  He is off Plaquenil.  He has chronic mild pancytopenia.  Plan: 1.  Labs today:  CBC with diff, ferritin, iron studies, sed rate, free light chain assay, PSA. 2.  Coordinate care with Dr [name]. 3.  Re-discuss possible bone marrow aspirate and biopsy to evalualate pancytopenia. 4.  Follow-up GI evaluation (EGD and colonoscopy) with Dr. Vicente Males. 5.  RTC on 09/27/2016 for MD assessment and labs (CBC with diff).   Lequita Asal, MD  09/09/2016, 9:40 AM

## 2016-09-09 NOTE — Telephone Encounter (Signed)
-----   Message from Lequita Asal, MD sent at 09/09/2016 10:12 AM EDT ----- Regarding: Please call patient  No need for IV iron  M  ----- Message ----- From: Interface, Lab In [facility] Sent: 09/09/2016   9:14 AM To: Lequita Asal, MD

## 2016-09-10 LAB — KAPPA/LAMBDA LIGHT CHAINS
Kappa free light chain: 33.7 mg/L — ABNORMAL HIGH (ref 3.3–19.4)
Kappa, lambda light chain ratio: 1.6 (ref 0.26–1.65)
Lambda free light chains: 21.1 mg/L (ref 5.7–26.3)

## 2016-09-13 ENCOUNTER — Other Ambulatory Visit: Payer: Self-pay

## 2016-09-13 ENCOUNTER — Encounter: Payer: Self-pay | Admitting: Gastroenterology

## 2016-09-13 ENCOUNTER — Ambulatory Visit (INDEPENDENT_AMBULATORY_CARE_PROVIDER_SITE_OTHER): Payer: Medicare Other | Admitting: Gastroenterology

## 2016-09-13 ENCOUNTER — Telehealth: Payer: Self-pay

## 2016-09-13 VITALS — BP 111/60 | HR 54 | Temp 98.2°F | Ht 68.0 in | Wt 185.8 lb

## 2016-09-13 DIAGNOSIS — R933 Abnormal findings on diagnostic imaging of other parts of digestive tract: Secondary | ICD-10-CM

## 2016-09-13 DIAGNOSIS — D509 Iron deficiency anemia, unspecified: Secondary | ICD-10-CM

## 2016-09-13 NOTE — Telephone Encounter (Signed)
Gastroenterology Pre-Procedure Review  Request Date: 5/4 Requesting Physician: Dr. Vicente Males  PATIENT REVIEW QUESTIONS: The patient responded to the following health history questions as indicated:    1. Are you having any GI issues? yes (constipation) 2. Do you have a personal history of Polyps? no 3. Do you have a family history of Colon Cancer or Polyps? no 4. Diabetes Mellitus? no 5. Joint replacements in the past 12 months?no 6. Major health problems in the past 3 months?no 7. Any artificial heart valves, MVP, or defibrillator?yes (pig valve @ Dr. [name])    [place]:    Patient reports the following regarding taking any anticoagulation/antiplatelet therapy:   Plavix, Coumadin, Eliquis, Xarelto, Lovenox, Pradaxa, Brilinta, or Effient? no Aspirin? yes (325 mg)  Patient confirms/reports the following medications:  Current Outpatient Prescriptions  Medication Sig Dispense Refill  . amLODipine (NORVASC) 5 MG tablet Take 1 tablet (5 mg total) by mouth daily. 90 tablet 3  . amoxicillin (AMOXIL) 500 MG capsule Take 2,000 mg by mouth as needed (prior to dental procedures). Reported on 11/17/2015    . aspirin (GOODSENSE ASPIRIN) 325 MG tablet Take 325 mg by mouth daily.    Marland Kitchen b complex vitamins tablet Take 1 tablet by mouth daily.    . brimonidine (ALPHAGAN) 0.2 % ophthalmic solution Place 1 drop into both eyes every morning.     . cetirizine (ZYRTEC) 10 MG tablet Take 10 mg by mouth daily.    . cholecalciferol (VITAMIN D) 400 units TABS tablet Take 400 Units by mouth daily.    Marland Kitchen doxycycline (VIBRA-TABS) 100 MG tablet Take 100 mg by mouth 2 (two) times daily.    . fenofibrate 160 MG tablet Take 1 tablet (160 mg total) by mouth daily. 90 tablet 1  . fentaNYL (DURAGESIC - DOSED MCG/HR) 25 MCG/HR patch Apply one patch to skin every 3 days if tolerated (Patient taking differently: Place 25 mcg onto the skin every 3 (three) days. Apply one patch to skin every 3 days if tolerated) 10  patch 0  . ferrous sulfate 325 (65 FE) MG tablet Take 325 mg by mouth daily with breakfast.    . FLUoxetine (PROZAC) 20 MG capsule TAKE 1 CAPSULE BY MOUTH EVERY DAY 90 capsule 1  . folic acid (FOLVITE) 1 MG tablet Take 1 mg by mouth 2 (two) times daily.    Marland Kitchen gabapentin (NEURONTIN) 800 MG tablet Limit 1 tablet by mouth 2-3 times per day if tolerated (Patient taking differently: Take 400 mg by mouth 3 (three) times daily. Limit 1 tablet by mouth 2-3 times per day if tolerated) 270 tablet 0  . hydrALAZINE (APRESOLINE) 25 MG tablet Take 1 tablet (25 mg total) by mouth 3 (three) times daily. 90 tablet 3  . HYDROcodone-acetaminophen (NORCO/VICODIN) 5-325 MG tablet Limit  2-4 tablets by mouth per day for breakthrough pain while wearing fentanyl patch if tolerated (Patient taking differently: Take 1 tablet by mouth 4 (four) times daily. Limit  2-4 tablets by mouth per day for breakthrough pain while wearing fentanyl patch if tolerated) 120 tablet 0  . hydroxychloroquine (PLAQUENIL) 200 MG tablet Take 200 mg by mouth daily.    Marland Kitchen lisinopril (PRINIVIL,ZESTRIL) 20 MG tablet Take 1 tablet (20 mg total) by mouth every evening. 90 tablet 3  . Magnesium 400 MG CAPS Take by mouth.    . predniSONE (DELTASONE) 5 MG tablet Take 5 mg by mouth daily as needed. Reported on 11/17/2015    . simvastatin (ZOCOR) 40 MG tablet  Take 1 tablet (40 mg total) by mouth at bedtime. (Patient not taking: Reported on 09/13/2016) 90 tablet 3  . tamsulosin (FLOMAX) 0.4 MG CAPS capsule TAKE 1 CAPSULE BY MOUTH EVERY DAY 90 capsule 1  . traZODone (DESYREL) 50 MG tablet Take 100 mg by mouth at bedtime as needed for sleep. Reported on 10/09/2015    . vitamin B-12 (CYANOCOBALAMIN) 1000 MCG tablet Take 1,000 mcg by mouth daily.    . vitamin C (ASCORBIC ACID) 500 MG tablet Take 500 mg by mouth daily.     Current Facility-Administered Medications  Medication Dose Route Frequency Provider Last Rate Last Dose  . fentaNYL (SUBLIMAZE) injection 100 mcg   100 mcg Intravenous Once Mohammed Kindle, MD      . lactated ringers infusion 1,000 mL  1,000 mL Intravenous Continuous Mohammed Kindle, MD 125 mL/hr at 01/19/16 1011 1,000 mL at 01/19/16 1011  . midazolam (VERSED) 5 MG/5ML injection 5 mg  5 mg Intravenous Once Mohammed Kindle, MD        Patient confirms/reports the following allergies:  Allergies  Allergen Reactions  . Penicillins Hives and Swelling    SWELLING REACTION UNSPECIFIED PATIENT HAS TAKEN AMOXICILLIN ON MED HX FROM [facility] Has patient had a PCN reaction causing immediate rash, facial/tongue/throat swelling, SOB or lightheadedness with hypotension: Yes Has patient had a PCN reaction causing severe rash involving mucus membranes or skin necrosis: No Has patient had a PCN reaction that required hospitalization No Has patient had a PCN reaction occurring within the last 10 years: No If all of the above answers are "NO", then may proceed with  . Demerol [Meperidine] Hives and Nausea And Vomiting    No orders of the defined types were placed in this encounter.   AUTHORIZATION INFORMATION Primary Insurance: 1D#: Group #:  Secondary Insurance: 1D#: Group #:  SCHEDULE INFORMATION: Date: 5/4 Time: Location: [place]

## 2016-09-13 NOTE — Progress Notes (Signed)
Gastroenterology Consultation  Referring Provider:     Lequita Asal, MD Primary Care Physician:  [name], MD Primary Gastroenterologist:  Dr. Jonathon Bellows  Reason for Consultation:     Iron deficiency anemia         HPI:   Bryan [name] is a 76 y.o. y/o male referred for consultation & management  by Dr. [name], MD.    He is a patient of Dr [name] and has been referred for iron deficiency anemia. He does have a diagnosis of RA, worsening of anemia . During the process of work up a CT scan of the abdomen was performed on 08/09/16 and it showed no findings that were abnormal in the chest but assymetric left rectal wall thickening .  1 month back had low iron % saturation . Last week Hb was 9.9 grams with a MCV of 90 .Urine analysis was normal 4 months back with no blood.   Rectal bleeding: no  Nose bleeds: no  Hematemesis or hemoptysis : no  Blood in urine : no   No family history of colon cancer. Last colonoscopy at least 10 years back which was normal. He is on asprin. 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  taken out 12 years back    Past Medical History:  Diagnosis Date  . Allergy   . Arrhythmia   . 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 Chronic back pain   . Complication of anesthesia    Memory loss 09/2015  . Coronary artery disease   . Depression   . Glaucoma   . H/O thoracic aortic aneurysm repair   . History of being hospitalized    memory lose kidney funtion down blood pressure up  . History of blood transfusion   . History of chicken pox   . Hyperlipidemia   . Hypertension   . Lumbar stenosis   . Paroxysmal A-fib ([place])   . Valvular heart disease     Past Surgical History:  Procedure Laterality Date  . AORTIC VALVE REPLACEMENT  2007   [facility].  Supply, [place]  . APPENDECTOMY  2010  . CATARACT EXTRACTION W/ INTRAOCULAR LENS   IMPLANT, BILATERAL    . CHOLECYSTECTOMY  2010  . HAMMER TOE SURGERY Right 10/09/2015   Procedure: HAMMER TOE REPAIR WITH K-WIRE FIXATION RIGHT SECOND TOE;  Surgeon: Albertine Patricia, DPM;  Location: [place];  Service: Podiatry;  Laterality: Right;  WITH LOCAL  . LUMBAR LAMINECTOMY/DECOMPRESSION MICRODISCECTOMY Right 03/08/2016   Procedure: Laminectomy and Foraminotomy - Lumbar Five-Sacral One Right;  Surgeon: Kary Kos, MD;  Location: [place];  Service: Neurosurgery;  Laterality: Right;  Right  . MULTIPLE TOOTH EXTRACTIONS    . REPLACEMENT TOTAL KNEE Left 2003  . THORACIC AORTIC ANEURYSM REPAIR  2010    Prior to Admission medications   Medication Sig Start Date End Date Taking? Authorizing Provider  amLODipine (NORVASC) 5 MG tablet Take 1 tablet (5 mg total) by mouth daily. 03/04/16  Yes [name], MD  amoxicillin (AMOXIL) 500 MG capsule Take 2,000 mg by mouth as needed (prior to dental procedures). Reported on 11/17/2015   Yes Historical Provider, MD  aspirin (GOODSENSE ASPIRIN) 325 MG tablet Take 325 mg by mouth daily.   Yes Historical Provider, MD  b complex vitamins tablet Take 1 tablet by mouth daily.   Yes Historical Provider, MD  brimonidine (ALPHAGAN) 0.2 % ophthalmic solution Place 1 drop into both eyes every morning.  10/28/15  Yes Historical Provider, MD  cetirizine (ZYRTEC) 10 MG tablet Take 10 mg by mouth daily.   Yes Historical Provider, MD  cholecalciferol (VITAMIN D) 400 units TABS tablet Take 400 Units by mouth daily.   Yes Historical Provider, MD  fenofibrate 160 MG tablet Take 1 tablet (160 mg total) by mouth daily. 03/29/16  Yes Minna Merritts, MD  fentaNYL (DURAGESIC - DOSED MCG/HR) 25 MCG/HR patch Apply one patch to skin every 3 days if tolerated Patient taking differently: Place 25 mcg onto the skin every 3 (three) days. Apply one patch to skin every 3 days if tolerated 01/28/16  Yes Mohammed Kindle, MD  ferrous sulfate 325 (65 FE) MG tablet Take 325 mg by  mouth daily with breakfast.   Yes Historical Provider, MD  FLUoxetine (PROZAC) 20 MG capsule TAKE 1 CAPSULE BY MOUTH EVERY DAY 09/01/16  Yes [name], MD  folic acid (FOLVITE) 1 MG tablet Take 1 mg by mouth 2 (two) times daily.   Yes Historical Provider, MD  gabapentin (NEURONTIN) 800 MG tablet Limit 1 tablet by mouth 2-3 times per day if tolerated Patient taking differently: Take 400 mg by mouth 3 (three) times daily. Limit 1 tablet by mouth 2-3 times per day if tolerated 01/28/16  Yes Mohammed Kindle, MD  hydrALAZINE (APRESOLINE) 25 MG tablet Take 1 tablet (25 mg total) by mouth 3 (three) times daily. 06/29/16  Yes Minna Merritts, MD  HYDROcodone-acetaminophen (NORCO/VICODIN) 5-325 MG tablet Limit  2-4 tablets by mouth per day for breakthrough pain while wearing fentanyl patch if tolerated Patient taking differently: Take 1 tablet by mouth 4 (four) times daily. Limit  2-4 tablets by mouth per day for breakthrough pain while wearing fentanyl patch if tolerated 01/28/16  Yes Mohammed Kindle, MD  hydroxychloroquine (PLAQUENIL) 200 MG tablet Take 200 mg by mouth daily.   Yes Historical Provider, MD  lisinopril (PRINIVIL,ZESTRIL) 20 MG tablet Take 1 tablet (20 mg total) by mouth every evening. 03/08/16  Yes Minna Merritts, MD  Magnesium 400 MG CAPS Take by mouth.   Yes Historical Provider, MD  predniSONE (DELTASONE) 5 MG tablet Take 5 mg by mouth daily as needed. Reported on 11/17/2015   Yes Historical Provider, MD  tamsulosin (FLOMAX) 0.4 MG CAPS capsule TAKE 1 CAPSULE BY MOUTH EVERY DAY 05/06/16  Yes [name], MD  traZODone (DESYREL) 50 MG tablet Take 100 mg by mouth at bedtime as needed for sleep. Reported on 10/09/2015 04/15/15  Yes Historical Provider, MD  vitamin B-12 (CYANOCOBALAMIN) 1000 MCG tablet Take 1,000 mcg by mouth daily.   Yes Historical Provider, MD  vitamin C (ASCORBIC ACID) 500 MG tablet Take 500 mg by mouth daily.   Yes Historical Provider, MD  doxycycline (VIBRA-TABS) 100  MG tablet Take 100 mg by mouth 2 (two) times daily. 08/19/16   Historical Provider, MD  simvastatin (ZOCOR) 40 MG tablet Take 1 tablet (40 mg total) by mouth at bedtime. Patient not taking: Reported on 09/13/2016 12/15/15   Minna Merritts, MD    Family History  Problem Relation Age of Onset  . Heart failure Mother   . Hypertension Mother   . Asthma Mother   . Heart attack Father 32    MI  . Hypertension Sister      Social History  Substance Use Topics  . Smoking status: Former Smoker    Packs/day: 1.00  Years: 32.00    Types: Cigarettes    Quit date: 07/05/1981  . Smokeless tobacco: Never Used  . Alcohol use 0.6 oz/week    1 Glasses of wine per week     Comment: occasional; glass of wine    Allergies as of 09/13/2016 - Review Complete 09/13/2016  Allergen Reaction Noted  . Penicillins Hives and Swelling 11/06/2010  . Demerol [meperidine] Hives and Nausea And Vomiting 11/27/2013    Review of Systems:    All systems reviewed and negative except where noted in HPI.   Physical Exam:  BP 111/60   Pulse (!) 54   Temp 98.2 F (36.8 C) (Oral)   Ht 5\' 8"  (1.727 m)   Wt 185 lb 12.8 oz (84.3 kg)   BMI 28.25 kg/m  No LMP for male patient. Psych:  Alert and cooperative. Normal mood and affect. General:   Alert,  Well-developed, well-nourished, pleasant and cooperative in NAD Head:  Normocephalic and atraumatic. Eyes:  Sclera clear, no icterus.   Conjunctiva pink. Ears:  Normal auditory acuity. Nose:  No deformity, discharge, or lesions. Mouth:  No deformity or lesions,oropharynx pink & moist. Neck:  Supple; no masses or thyromegaly. Lungs:  Respirations even and unlabored.  Clear throughout to auscultation.   No wheezes, crackles, or rhonchi. No acute distress. Heart:  si soft , sytolic murmur in aortic area, normal s2  Abdomen:  Normal bowel sounds.  No bruits.  Soft, non-tender and non-distended without masses, hepatosplenomegaly or hernias noted.  No guarding or rebound  tenderness.    Neurologic:  Alert and oriented x3;  grossly normal neurologically. Psych:  Alert and cooperative. Normal mood and affect.  Imaging Studies: No results found.  Assessment and Plan:   Bryan [name] is a 76 y.o. y/o male has been referred for iron deficiency anemia, rectal wall thickening on the CT scan of the abdomen. He has no overt signs of blood loss.  Plan  1. EGD+colonoscopy and if negative capsule study of the small bowel  2. Celiac serology  3. He is on 325 mg of asprin a day and I will get clearance from his cardiologist for him to go on 81 mg a day which does not need to be stopped for the procedure.   I have discussed alternative options, risks & benefits,  which include, but are not limited to, bleeding, infection, perforation,respiratory complication & drug reaction.  The patient agrees with this plan & written consent will be obtained.     Follow up in 8-10 weeks  Dr Jonathon Bellows MD

## 2016-09-14 ENCOUNTER — Telehealth: Payer: Self-pay | Admitting: Cardiovascular Disease

## 2016-09-14 ENCOUNTER — Telehealth: Payer: Self-pay | Admitting: Gastroenterology

## 2016-09-14 DIAGNOSIS — L97511 Non-pressure chronic ulcer of other part of right foot limited to breakdown of skin: Secondary | ICD-10-CM | POA: Diagnosis not present

## 2016-09-14 DIAGNOSIS — G609 Hereditary and idiopathic neuropathy, unspecified: Secondary | ICD-10-CM | POA: Diagnosis not present

## 2016-09-14 NOTE — Telephone Encounter (Signed)
09/14/16 Prior auth NOT required for [facility] for EGD & Colonoscopy.

## 2016-09-14 NOTE — Telephone Encounter (Signed)
Received cardiac clearance for pt to proceed w/ EGD & colonoscopy on 09/24/16 w/ [facility]. Pt currently takes aspirin 325 mg daily.   They would like recommendations on holding this prior to procedure. Please route clearance and instructions to Leontine Locket, CMA @ [phone].

## 2016-09-15 ENCOUNTER — Other Ambulatory Visit: Payer: Self-pay

## 2016-09-15 DIAGNOSIS — D509 Iron deficiency anemia, unspecified: Secondary | ICD-10-CM

## 2016-09-15 NOTE — Telephone Encounter (Signed)
Acceptable risk for surgery Would decrease aspirin down to 81 mg daily during the surgical. Should not need to hold baby aspirin

## 2016-09-16 ENCOUNTER — Telehealth: Payer: Self-pay

## 2016-09-16 NOTE — Telephone Encounter (Signed)
Called patient and advised medical clearance received from Dr. [name].   Acceptable risk for surgery. Decrease aspirin to 81mg  daily during surgical. No need to hold baby aspirin. Resume 325 daily 1 day following surgery

## 2016-09-16 NOTE — Telephone Encounter (Signed)
Cardiac clearance routed to number provided.  

## 2016-09-17 ENCOUNTER — Other Ambulatory Visit: Payer: Self-pay | Admitting: Neurosurgery

## 2016-09-17 DIAGNOSIS — M5137 Other intervertebral disc degeneration, lumbosacral region: Secondary | ICD-10-CM | POA: Diagnosis not present

## 2016-09-23 ENCOUNTER — Encounter: Payer: Self-pay | Admitting: *Deleted

## 2016-09-23 DIAGNOSIS — M0579 Rheumatoid arthritis with rheumatoid factor of multiple sites without organ or systems involvement: Secondary | ICD-10-CM | POA: Diagnosis not present

## 2016-09-23 DIAGNOSIS — D693 Immune thrombocytopenic purpura: Secondary | ICD-10-CM | POA: Diagnosis not present

## 2016-09-23 DIAGNOSIS — D709 Neutropenia, unspecified: Secondary | ICD-10-CM | POA: Diagnosis not present

## 2016-09-23 DIAGNOSIS — N183 Chronic kidney disease, stage 3 (moderate): Secondary | ICD-10-CM | POA: Diagnosis not present

## 2016-09-24 ENCOUNTER — Encounter: Payer: Self-pay | Admitting: Anesthesiology

## 2016-09-24 ENCOUNTER — Ambulatory Visit: Payer: Medicare Other | Admitting: Registered Nurse

## 2016-09-24 ENCOUNTER — Ambulatory Visit
Admission: RE | Admit: 2016-09-24 | Discharge: 2016-09-24 | Disposition: A | Discharge status: Home / Self Care | Payer: Medicare Other | Source: Ambulatory Visit | Attending: Gastroenterology | Admitting: Gastroenterology

## 2016-09-24 ENCOUNTER — Encounter
Admission: RE | Disposition: A | Discharge status: Home / Self Care | Payer: Self-pay | Source: Ambulatory Visit | Attending: Gastroenterology

## 2016-09-24 DIAGNOSIS — E785 Hyperlipidemia, unspecified: Secondary | ICD-10-CM | POA: Insufficient documentation

## 2016-09-24 DIAGNOSIS — I2510 Atherosclerotic heart disease of native coronary artery without angina pectoris: Secondary | ICD-10-CM | POA: Insufficient documentation

## 2016-09-24 DIAGNOSIS — Z952 Presence of prosthetic heart valve: Secondary | ICD-10-CM | POA: Diagnosis not present

## 2016-09-24 DIAGNOSIS — K2970 Gastritis, unspecified, without bleeding: Secondary | ICD-10-CM

## 2016-09-24 DIAGNOSIS — Z7982 Long term (current) use of aspirin: Secondary | ICD-10-CM | POA: Insufficient documentation

## 2016-09-24 DIAGNOSIS — Z79891 Long term (current) use of opiate analgesic: Secondary | ICD-10-CM | POA: Diagnosis not present

## 2016-09-24 DIAGNOSIS — I340 Nonrheumatic mitral (valve) insufficiency: Secondary | ICD-10-CM | POA: Insufficient documentation

## 2016-09-24 DIAGNOSIS — I739 Peripheral vascular disease, unspecified: Secondary | ICD-10-CM | POA: Insufficient documentation

## 2016-09-24 DIAGNOSIS — Z96652 Presence of left artificial knee joint: Secondary | ICD-10-CM | POA: Insufficient documentation

## 2016-09-24 DIAGNOSIS — Z87891 Personal history of nicotine dependence: Secondary | ICD-10-CM | POA: Insufficient documentation

## 2016-09-24 DIAGNOSIS — D509 Iron deficiency anemia, unspecified: Secondary | ICD-10-CM | POA: Insufficient documentation

## 2016-09-24 DIAGNOSIS — I350 Nonrheumatic aortic (valve) stenosis: Secondary | ICD-10-CM | POA: Insufficient documentation

## 2016-09-24 DIAGNOSIS — K2950 Unspecified chronic gastritis without bleeding: Secondary | ICD-10-CM | POA: Insufficient documentation

## 2016-09-24 DIAGNOSIS — Z79899 Other long term (current) drug therapy: Secondary | ICD-10-CM | POA: Diagnosis not present

## 2016-09-24 DIAGNOSIS — D120 Benign neoplasm of cecum: Secondary | ICD-10-CM | POA: Diagnosis not present

## 2016-09-24 DIAGNOSIS — K31819 Angiodysplasia of stomach and duodenum without bleeding: Secondary | ICD-10-CM | POA: Diagnosis not present

## 2016-09-24 DIAGNOSIS — B3781 Candidal esophagitis: Secondary | ICD-10-CM

## 2016-09-24 DIAGNOSIS — K635 Polyp of colon: Secondary | ICD-10-CM | POA: Diagnosis not present

## 2016-09-24 DIAGNOSIS — R933 Abnormal findings on diagnostic imaging of other parts of digestive tract: Secondary | ICD-10-CM

## 2016-09-24 DIAGNOSIS — K648 Other hemorrhoids: Secondary | ICD-10-CM | POA: Diagnosis not present

## 2016-09-24 DIAGNOSIS — K640 First degree hemorrhoids: Secondary | ICD-10-CM | POA: Insufficient documentation

## 2016-09-24 DIAGNOSIS — K5790 Diverticulosis of intestine, part unspecified, without perforation or abscess without bleeding: Secondary | ICD-10-CM | POA: Diagnosis not present

## 2016-09-24 DIAGNOSIS — K5730 Diverticulosis of large intestine without perforation or abscess without bleeding: Secondary | ICD-10-CM | POA: Diagnosis not present

## 2016-09-24 DIAGNOSIS — I10 Essential (primary) hypertension: Secondary | ICD-10-CM | POA: Diagnosis not present

## 2016-09-24 DIAGNOSIS — F329 Major depressive disorder, single episode, unspecified: Secondary | ICD-10-CM | POA: Insufficient documentation

## 2016-09-24 HISTORY — PX: COLONOSCOPY WITH PROPOFOL: SHX5780

## 2016-09-24 HISTORY — PX: ESOPHAGOGASTRODUODENOSCOPY (EGD) WITH PROPOFOL: SHX5813

## 2016-09-24 HISTORY — PX: GIVENS CAPSULE STUDY: SHX5432

## 2016-09-24 LAB — KOH PREP: Special Requests: NORMAL

## 2016-09-24 LAB — HM COLONOSCOPY

## 2016-09-24 SURGERY — ESOPHAGOGASTRODUODENOSCOPY (EGD) WITH PROPOFOL
Anesthesia: General

## 2016-09-24 MED ORDER — PHENYLEPHRINE HCL 10 MG/ML IJ SOLN
INTRAMUSCULAR | Status: DC | PRN
  Administered 2016-09-24: 100 ug via INTRAVENOUS

## 2016-09-24 MED ORDER — PROPOFOL 10 MG/ML IV BOLUS
INTRAVENOUS | Status: DC | PRN
  Administered 2016-09-24: 50 mg via INTRAVENOUS
  Administered 2016-09-24 (×2): 10 mg via INTRAVENOUS

## 2016-09-24 MED ORDER — GLYCOPYRROLATE 0.2 MG/ML IJ SOLN
INTRAMUSCULAR | Status: AC
  Filled 2016-09-24: qty 1

## 2016-09-24 MED ORDER — SODIUM CHLORIDE 0.9 % IV SOLN
INTRAVENOUS | Status: DC
  Administered 2016-09-24: 1000 mL via INTRAVENOUS

## 2016-09-24 MED ORDER — LIDOCAINE HCL (CARDIAC) 20 MG/ML IV SOLN
INTRAVENOUS | Status: DC | PRN
  Administered 2016-09-24: 40 mg via INTRAVENOUS

## 2016-09-24 MED ORDER — EPHEDRINE SULFATE 50 MG/ML IJ SOLN
INTRAMUSCULAR | Status: AC
  Filled 2016-09-24: qty 1

## 2016-09-24 MED ORDER — PROPOFOL 500 MG/50ML IV EMUL
INTRAVENOUS | Status: AC
  Filled 2016-09-24: qty 50

## 2016-09-24 MED ORDER — PROPOFOL 500 MG/50ML IV EMUL
INTRAVENOUS | Status: DC | PRN
  Administered 2016-09-24: 150 ug/kg/min via INTRAVENOUS

## 2016-09-24 MED ORDER — GLYCOPYRROLATE 0.2 MG/ML IJ SOLN
INTRAMUSCULAR | Status: DC | PRN
  Administered 2016-09-24: 0.2 mg via INTRAVENOUS

## 2016-09-24 MED ORDER — EPHEDRINE SULFATE 50 MG/ML IJ SOLN
INTRAMUSCULAR | Status: DC | PRN
  Administered 2016-09-24 (×2): 5 mg via INTRAVENOUS

## 2016-09-24 NOTE — H&P (Signed)
Jonathon Bellows MD [address], [place], [place] [address] Phone: [phone] Fax : [phone]  Primary Care Physician:  [name], MD Primary Gastroenterologist:  Dr. Jonathon Bellows   Pre-Procedure History & Physical: HPI:  Bryan [name] is a 76 y.o. male is here for an endoscopy and colonoscopy.   Past Medical History:  Diagnosis Date  . Allergy   . Arrhythmia   . 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 Chronic back pain   . Complication of anesthesia    Memory loss 09/2015  . Coronary artery disease   . Depression   . Glaucoma   . H/O thoracic aortic aneurysm repair   . History of being hospitalized    memory lose kidney funtion down blood pressure up  . History of blood transfusion   . History of chicken pox   . Hyperlipidemia   . Hypertension   . Lumbar stenosis   . Paroxysmal A-fib ([place])   . Valvular heart disease     Past Surgical History:  Procedure Laterality Date  . AORTIC VALVE REPLACEMENT  2007   [facility].  Supply, [place]  . APPENDECTOMY  2010  . CATARACT EXTRACTION W/ INTRAOCULAR LENS  IMPLANT, BILATERAL    . CHOLECYSTECTOMY  2010  . EYE SURGERY    . HAMMER TOE SURGERY Right 10/09/2015   Procedure: HAMMER TOE REPAIR WITH K-WIRE FIXATION RIGHT SECOND TOE;  Surgeon: Albertine Patricia, DPM;  Location: [facility];  Service: Podiatry;  Laterality: Right;  WITH LOCAL  . LUMBAR LAMINECTOMY/DECOMPRESSION MICRODISCECTOMY Right 03/08/2016   Procedure: Laminectomy and Foraminotomy - Lumbar Five-Sacral One Right;  Surgeon: Kary Kos, MD;  Location: [place];  Service: Neurosurgery;  Laterality: Right;  Right  . MULTIPLE TOOTH EXTRACTIONS    . REPLACEMENT TOTAL KNEE Left 2003  . THORACIC AORTIC ANEURYSM REPAIR  2010    Prior to Admission medications   Medication Sig Start Date End Date Taking? Authorizing Provider  amLODipine (NORVASC) 5 MG tablet Take 1 tablet (5 mg total) by mouth daily. 03/04/16  Yes [name], MD  amoxicillin (AMOXIL) 500 MG capsule Take 2,000 mg by mouth as needed (prior to dental procedures). Reported on 11/17/2015   Yes Historical Provider, MD  aspirin (GOODSENSE ASPIRIN) 325 MG tablet Take 325 mg by mouth daily.   Yes Historical Provider, MD  b complex vitamins tablet Take 1 tablet by mouth daily.   Yes Historical Provider, MD  brimonidine (ALPHAGAN) 0.2 % ophthalmic solution Place 1 drop into both eyes every morning.  10/28/15  Yes Historical Provider, MD  cetirizine (ZYRTEC) 10 MG tablet Take 10 mg by mouth daily.   Yes Historical Provider, MD  cholecalciferol (VITAMIN D) 400 units TABS tablet Take 400 Units by mouth daily.   Yes Historical Provider, MD  doxycycline (VIBRA-TABS) 100 MG tablet Take 100 mg by mouth 2 (two) times daily. 08/19/16  Yes Historical Provider, MD  fenofibrate 160 MG tablet Take 1 tablet (160 mg total) by mouth daily. 03/29/16  Yes Minna Merritts, MD  fentaNYL (DURAGESIC - DOSED MCG/HR) 25 MCG/HR patch Apply one patch to skin every 3 days if tolerated Patient taking differently: Place 25 mcg onto the skin every 3 (three) days. Apply one patch to skin every 3 days if tolerated 01/28/16  Yes Mohammed Kindle, MD  ferrous sulfate 325 (65 FE) MG tablet Take 325 mg by mouth daily with breakfast.   Yes Historical Provider,  MD  FLUoxetine (PROZAC) 20 MG capsule TAKE 1 CAPSULE BY MOUTH EVERY DAY 09/01/16  Yes [name], MD  folic acid (FOLVITE) 1 MG tablet Take 1 mg by mouth 2 (two) times daily.   Yes Historical Provider, MD  gabapentin (NEURONTIN) 800 MG tablet Limit 1 tablet by mouth 2-3 times per day if tolerated Patient taking differently: Take 400 mg by mouth 3 (three) times daily. Limit 1 tablet by mouth 2-3 times per day if tolerated 01/28/16  Yes Mohammed Kindle, MD  hydrALAZINE (APRESOLINE) 25 MG tablet Take 1 tablet (25 mg total) by mouth 3 (three) times daily. 06/29/16  Yes Minna Merritts, MD  HYDROcodone-acetaminophen (NORCO/VICODIN) 5-325 MG  tablet Limit  2-4 tablets by mouth per day for breakthrough pain while wearing fentanyl patch if tolerated Patient taking differently: Take 1 tablet by mouth 4 (four) times daily. Limit  2-4 tablets by mouth per day for breakthrough pain while wearing fentanyl patch if tolerated 01/28/16  Yes Mohammed Kindle, MD  hydroxychloroquine (PLAQUENIL) 200 MG tablet Take 200 mg by mouth daily.   Yes Historical Provider, MD  lisinopril (PRINIVIL,ZESTRIL) 20 MG tablet Take 1 tablet (20 mg total) by mouth every evening. 03/08/16  Yes Minna Merritts, MD  Magnesium 400 MG CAPS Take by mouth.   Yes Historical Provider, MD  predniSONE (DELTASONE) 5 MG tablet Take 5 mg by mouth daily as needed. Reported on 11/17/2015   Yes Historical Provider, MD  simvastatin (ZOCOR) 40 MG tablet Take 1 tablet (40 mg total) by mouth at bedtime. 12/15/15  Yes Minna Merritts, MD  tamsulosin (FLOMAX) 0.4 MG CAPS capsule TAKE 1 CAPSULE BY MOUTH EVERY DAY 05/06/16  Yes [name], MD  traZODone (DESYREL) 50 MG tablet Take 100 mg by mouth at bedtime as needed for sleep. Reported on 10/09/2015 04/15/15  Yes Historical Provider, MD  vitamin B-12 (CYANOCOBALAMIN) 1000 MCG tablet Take 1,000 mcg by mouth daily.   Yes Historical Provider, MD  vitamin C (ASCORBIC ACID) 500 MG tablet Take 500 mg by mouth daily.   Yes Historical Provider, MD    Allergies as of 09/13/2016 - Review Complete 09/13/2016  Allergen Reaction Noted  . Penicillins Hives and Swelling 11/06/2010  . Demerol [meperidine] Hives and Nausea And Vomiting 11/27/2013    Family History  Problem Relation Age of Onset  . Heart failure Mother   . Hypertension Mother   . Asthma Mother   . Heart attack Father 68    MI  . Hypertension Sister     Social History   Social History  . Marital status: Married    Spouse name: N/A  . Number of children: N/A  . Years of education: N/A   Occupational History  . Not on file.   Social History Main Topics  . Smoking status:  Former Smoker    Packs/day: 1.00    Years: 32.00    Types: Cigarettes    Quit date: 07/05/1981  . Smokeless tobacco: Never Used  . Alcohol use 0.6 oz/week    1 Glasses of wine per week     Comment: occasional; glass of wine  . Drug use: No  . Sexual activity: Not Currently   Other Topics Concern  . Not on file   Social History Narrative  . No narrative on file    Review of Systems: See HPI, otherwise negative ROS  Physical Exam: BP 125/67   Pulse 78   Temp 97.1 F (36.2 C) (Tympanic)  Resp 16   Ht 5\' 11"  (1.803 m)   Wt 184 lb (83.5 kg)   SpO2 96%   BMI 25.66 kg/m  General:   Alert,  pleasant and cooperative in NAD Head:  Normocephalic and atraumatic. Neck:  Supple; no masses or thyromegaly. Lungs:  Clear throughout to auscultation.    Heart:  Regular rate and rhythm. Abdomen:  Soft, nontender and nondistended. Normal bowel sounds, without guarding, and without rebound.   Neurologic:  Alert and  oriented x4;  grossly normal neurologically.  Impression/Plan: Bryan [name] is here for an endoscopy and colonoscopy to be performed for iron deficiency anemia   Risks, benefits, limitations, and alternatives regarding  endoscopy and colonoscopy have been reviewed with the patient.  Questions have been answered.  All parties agreeable.   Jonathon Bellows, MD  09/24/2016, 8:06 AM

## 2016-09-24 NOTE — Transfer of Care (Signed)
Immediate Anesthesia Transfer of Care Note  Patient: RANDEL HARGENS  Procedure(s) Performed: Procedure(s): ESOPHAGOGASTRODUODENOSCOPY (EGD) WITH PROPOFOL (N/A) COLONOSCOPY WITH PROPOFOL (N/A) GIVENS CAPSULE STUDY (N/A)  Patient Location: PACU and Endoscopy Unit  Anesthesia Type:General  Level of Consciousness: sedated  Airway & Oxygen Therapy: Patient Spontanous Breathing and Patient connected to nasal cannula oxygen  Post-op Assessment: Report given to RN and Post -op Vital signs reviewed and stable  Post vital signs: Reviewed and stable  Last Vitals:  Vitals:   09/24/16 0735 09/24/16 0936  BP: 125/67 (!) 92/53  Pulse: 78 78  Resp: 16 20  Temp: 36.2 C 16.1 C    Complications: No apparent anesthesia complications

## 2016-09-24 NOTE — Anesthesia Procedure Notes (Signed)
Date/Time: 09/24/2016 8:55 AM Performed by: Doreen Salvage Pre-anesthesia Checklist: Patient identified, Emergency Drugs available, Suction available and Patient being monitored Patient Re-evaluated:Patient Re-evaluated prior to inductionOxygen Delivery Method: Nasal cannula Intubation Type: IV induction Dental Injury: Teeth and Oropharynx as per pre-operative assessment  Comments: Nasal cannula with etCO2 monitoring

## 2016-09-24 NOTE — Op Note (Signed)
[facility] Gastroenterology Patient Name: Bryan [name] Procedure Date: 09/24/2016 8:52 AM MRN: [id number] Account #: [id number] Date of Birth: Aug 29, 1940 Admit Type: Outpatient Age: 76 Room: [facility] ENDO ROOM 4 Gender: Male Note Status: Finalized Procedure:            Colonoscopy Indications:          Iron deficiency anemia Providers:            Jonathon Bellows MD, MD Medicines:            Monitored Anesthesia Care Complications:        No immediate complications. Procedure:            Pre-Anesthesia Assessment:                       - Prior to the procedure, a History and Physical was                        performed, and patient medications, allergies and                        sensitivities were reviewed. The patient's tolerance of                        previous anesthesia was reviewed.                       - The risks and benefits of the procedure and the                        sedation options and risks were discussed with the                        patient. All questions were answered and informed                        consent was obtained.                       - ASA Grade Assessment: III - A patient with severe                        systemic disease.                       After obtaining informed consent, the colonoscope was                        passed under direct vision. Throughout the procedure,                        the patient's blood pressure, pulse, and oxygen                        saturations were monitored continuously. The                        Colonoscope was introduced through the anus and                        advanced to the the cecum, identified by the  appendiceal orifice, IC valve and transillumination.                        The colonoscopy was performed with ease. The patient                        tolerated the procedure well. The quality of the bowel                        preparation was  good. Findings:      The perianal and digital rectal examinations were normal.      Non-bleeding internal hemorrhoids were found during retroflexion. The       hemorrhoids were small and Grade I (internal hemorrhoids that do not       prolapse).      Multiple small-mouthed diverticula were found in the entire colon.      A 3 mm polyp was found in the cecum. The polyp was sessile. The polyp       was removed with a cold biopsy forceps. Resection and retrieval were       complete.      The exam was otherwise without abnormality on direct and retroflexion       views. Impression:           - Non-bleeding internal hemorrhoids.                       - Diverticulosis in the entire examined colon.                       - One 3 mm polyp in the cecum, removed with a cold                        biopsy forceps. Resected and retrieved.                       - The examination was otherwise normal on direct and                        retroflexion views. Recommendation:       - Discharge patient to home (with escort).                       - NPO for 5 hours.                       - Continue present medications.                       - Use Prilosec (omeprazole) 40 mg PO daily for 6 weeks.                       - Await pathology results.                       - To visualize the small bowel, perform video capsule                        endoscopy today.                       - Return to my office in 4  weeks. Procedure Code(s):    --- Professional ---                       [phone], Colonoscopy, flexible; with biopsy, single or                        multiple Diagnosis Code(s):    --- Professional ---                       K64.0, First degree hemorrhoids                       D12.0, Benign neoplasm of cecum                       D50.9, Iron deficiency anemia, unspecified                       K57.30, Diverticulosis of large intestine without                        perforation or abscess without  bleeding CPT copyright 2016 American Medical Association. All rights reserved. The codes documented in this report are preliminary and upon coder review may  be revised to meet current compliance requirements. Jonathon Bellows, MD Jonathon Bellows MD, MD 09/24/2016 9:34:18 AM This report has been signed electronically. Number of Addenda: 0 Note Initiated On: 09/24/2016 8:52 AM Scope Withdrawal Time: 0 hours 9 minutes 49 seconds  Total Procedure Duration: 0 hours 13 minutes 48 seconds       [facility]

## 2016-09-24 NOTE — Anesthesia Post-op Follow-up Note (Cosign Needed)
Anesthesia QCDR form completed.        

## 2016-09-24 NOTE — Anesthesia Preprocedure Evaluation (Signed)
Anesthesia Evaluation  Patient identified by MRN, date of birth, ID band Patient awake    Reviewed: Allergy & Precautions, NPO status , Patient's Chart, lab work & pertinent test results  History of Anesthesia Complications (+) history of anesthetic complications  Airway Mallampati: II  TM Distance: >3 FB Neck ROM: Full    Dental no notable dental hx.    Pulmonary neg pulmonary ROS, former smoker,    Pulmonary exam normal        Cardiovascular hypertension, + CAD, + Peripheral Vascular Disease (ascending aortic aneurysm) and +CHF  Normal cardiovascular exam+ dysrhythmias Atrial Fibrillation + Valvular Problems/Murmurs (moderate AS) AS   The cavity size was mildly dilated. Systolic   function was normal. The estimated ejection fraction was in the   range of 50% to 55%. Wall motion was normal; there were no   regional wall motion abnormalities. Features are consistent with   a pseudonormal left ventricular filling pattern, with concomitant   abnormal relaxation and increased filling pressure (grade 2   diastolic dysfunction). - Aortic valve: A bioprosthesis was present. There was moderate   stenosis. Mean gradient (S): 20 mm Hg. Valve area (VTI): 1.24   cm^2. - Aorta: Aortic root dimension: 44 mm (ED). - Aortic root: The aortic root was moderately dilated. - Mitral valve: There was mild regurgitation. - Left atrium: The atrium was moderately dilated. - Right atrium: The atrium was mildly dilated. - Pulmonary arteries: Systolic pressure was moderately increased.   PA peak pressure: 53 mm Hg (s).  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 No date: Chronic back pain No date: Complication of anesthesia     Comment: Memory loss 09/2015 No date: Coronary artery disease No date: Depression No date: Glaucoma No date: H/O thoracic aortic aneurysm repair No date: History of being hospitalized     Comment: memory lose kidney funtion down blood pressure              up No date: History of blood transfusion No date: History of chicken pox No date: Hyperlipidemia No date: Hypertension No date: Lumbar stenosis No date: Paroxysmal A-fib (HCC) No date: Valvular heart disease  Reproductive/Obstetrics negative OB ROS                             Anesthesia Physical  Anesthesia Plan  ASA: III  Anesthesia Plan: General   Post-op Pain Management:    Induction: Intravenous  Airway Management Planned: Nasal Cannula  Additional Equipment:   Intra-op Plan:   Post-operative Plan: Extubation in OR  Informed Consent: I have reviewed the patients History and Physical, chart, labs and discussed the procedure including the risks, benefits and alternatives for the proposed anesthesia with the patient or authorized representative who has indicated his/her understanding and acceptance.   Dental advisory given  Plan Discussed with: CRNA and Surgeon  Anesthesia Plan Comments:  Anesthesia Quick Evaluation Explained the increased likelyhood that he will experience emergence delirium. Will minimize sedating medications. Patient voiced understanding and opted to proceed with general anesthesia.

## 2016-09-24 NOTE — Anesthesia Postprocedure Evaluation (Signed)
Anesthesia Post Note  Patient: RITVIK MCZEAL  Procedure(s) Performed: Procedure(s) (LRB): ESOPHAGOGASTRODUODENOSCOPY (EGD) WITH PROPOFOL (N/A) COLONOSCOPY WITH PROPOFOL (N/A) GIVENS CAPSULE STUDY (N/A)  Patient location during evaluation: PACU Anesthesia Type: General Level of consciousness: awake and alert and oriented Pain management: pain level controlled Vital Signs Assessment: post-procedure vital signs reviewed and stable Respiratory status: spontaneous breathing Cardiovascular status: blood pressure returned to baseline Anesthetic complications: no     Last Vitals:  Vitals:   09/24/16 0956 09/24/16 1006  BP: 131/67 116/69  Pulse: 73 73  Resp: 18 16  Temp:      Last Pain:  Vitals:   09/24/16 0936  TempSrc: Tympanic                 Roshawna Colclasure

## 2016-09-24 NOTE — Op Note (Signed)
[facility] Patient Name: Bryan [name] Procedure Date: 09/24/2016 8:53 AM MRN: [id number] Account #: [id number] Date of Birth: [birth year] Admit Type: Outpatient Age: 76 Room: [facility] ENDO ROOM 4 Gender: Male Note Status: Finalized Procedure:            Upper GI endoscopy Indications:          Iron deficiency anemia Providers:            Jonathon Bellows MD, MD Referring MD:         Angela Adam. Caryl Bis (Referring MD) Medicines:            Monitored Anesthesia Care Complications:        No immediate complications. Procedure:            Pre-Anesthesia Assessment:                       - Prior to the procedure, a History and Physical was                        performed, and patient medications, allergies and                        sensitivities were reviewed. The patient's tolerance of                        previous anesthesia was reviewed.                       - The risks and benefits of the procedure and the                        sedation options and risks were discussed with the                        patient. All questions were answered and informed                        consent was obtained.                       - ASA Grade Assessment: III - A patient with severe                        systemic disease.                       After obtaining informed consent, the endoscope was                        passed under direct vision. Throughout the procedure,                        the patient's blood pressure, pulse, and oxygen                        saturations were monitored continuously. The Endoscope                        was introduced through the mouth, and advanced to the  third part of duodenum. The upper GI endoscopy was                        accomplished with ease. The patient tolerated the                        procedure well. Findings:      Two 3 mm angioectasias without bleeding were found in the first portion      of the duodenum and in the third portion of the duodenum. Coagulation       for bleeding prevention using argon plasma at 0.5 liters/minute and 20       watts was successful.      Diffuse moderate inflammation characterized by adherent blood,       congestion (edema), erosions and erythema was found in the gastric       antrum. Biopsies were taken with a cold forceps for histology.      Patchy candidiasis was found in the entire esophagus. Brushings were       obtained in the mid esophagus. Impression:           - Two non-bleeding angioectasias in the duodenum.                        Treated with argon plasma coagulation (APC).                       - Gastritis. Biopsied.                       - Monilial esophagitis. Brushings performed. Recommendation:       - Await pathology results.                       - Perform a colonoscopy today. Procedure Code(s):    --- Professional ---                       [id number], 60, Esophagogastroduodenoscopy, flexible,                        transoral; with control of bleeding, any method                       43239, Esophagogastroduodenoscopy, flexible, transoral;                        with biopsy, single or multiple Diagnosis Code(s):    --- Professional ---                       K31.819, Angiodysplasia of stomach and duodenum without                        bleeding                       K29.70, Gastritis, unspecified, without bleeding                       B37.81, Candidal esophagitis                       D50.9, Iron deficiency anemia, unspecified CPT copyright 2016 American Medical Association. All rights reserved. The  codes documented in this report are preliminary and upon coder review may  be revised to meet current compliance requirements. Jonathon Bellows, MD Jonathon Bellows MD, MD 09/24/2016 9:16:03 AM This report has been signed electronically. Number of Addenda: 0 Note Initiated On: 09/24/2016 8:53 AM      [facility]

## 2016-09-27 ENCOUNTER — Encounter: Payer: Self-pay | Admitting: Gastroenterology

## 2016-09-27 ENCOUNTER — Ambulatory Visit: Payer: Medicare Other | Admitting: Hematology and Oncology

## 2016-09-27 ENCOUNTER — Other Ambulatory Visit: Payer: Medicare Other

## 2016-09-27 DIAGNOSIS — D61818 Other pancytopenia: Secondary | ICD-10-CM | POA: Insufficient documentation

## 2016-09-27 DIAGNOSIS — D72819 Decreased white blood cell count, unspecified: Secondary | ICD-10-CM | POA: Insufficient documentation

## 2016-09-27 DIAGNOSIS — D696 Thrombocytopenia, unspecified: Secondary | ICD-10-CM | POA: Insufficient documentation

## 2016-09-27 LAB — SURGICAL PATHOLOGY

## 2016-09-28 DIAGNOSIS — M2031 Hallux varus (acquired), right foot: Secondary | ICD-10-CM | POA: Diagnosis not present

## 2016-09-28 DIAGNOSIS — L97511 Non-pressure chronic ulcer of other part of right foot limited to breakdown of skin: Secondary | ICD-10-CM | POA: Diagnosis not present

## 2016-09-30 DIAGNOSIS — M25562 Pain in left knee: Secondary | ICD-10-CM | POA: Diagnosis not present

## 2016-09-30 DIAGNOSIS — M25561 Pain in right knee: Secondary | ICD-10-CM | POA: Diagnosis not present

## 2016-09-30 DIAGNOSIS — M1711 Unilateral primary osteoarthritis, right knee: Secondary | ICD-10-CM | POA: Diagnosis not present

## 2016-10-01 ENCOUNTER — Other Ambulatory Visit (HOSPITAL_COMMUNITY): Payer: Self-pay | Admitting: *Deleted

## 2016-10-04 ENCOUNTER — Encounter: Payer: Self-pay | Admitting: Gastroenterology

## 2016-10-05 ENCOUNTER — Telehealth: Payer: Self-pay | Admitting: *Deleted

## 2016-10-05 NOTE — Telephone Encounter (Signed)
  Please call patient.  EGD and colonoscopy reports reviewed.  M

## 2016-10-05 NOTE — Pre-Procedure Instructions (Addendum)
Bryan [name]  10/05/2016      [facility] - [place], [place] - [address] [place] [address] Phone: [phone] Fax: [phone]    Your procedure is scheduled on  Thurs. May 24  Report to [facility] Admitting at 5:30 A.M.  Call this number if you have problems the morning of surgery:  [phone]   Remember:  Do not eat food or drink liquids after midnight on Wed. May 23   Take these medicines the morning of surgery with A SIP OF WATER : amlodipine (norvasc), eye drops, certirizine (zertec), fluoxetine (prozac), gabapentin (neurontin), hydralazine (apresoline), hydrocodone if needed,                1 week prior to surgery stop advil, motrin, aleve, ibuprofen, BC Powders, Goody's, vitamins/herbal medicines.   Do not wear jewelry.  Do not wear lotions, powders, or perfumes, or deoderant.  Do not shave 48 hours prior to surgery.  Men may shave face and neck.  Do not bring valuables to the hospital.  [facility] is not responsible for any belongings or valuables.  Contacts, dentures or bridgework may not be worn into surgery.  Leave your suitcase in the car.  After surgery it may be brought to your room.  For patients admitted to the hospital, discharge time will be determined by your treatment team.  Patients discharged the day of surgery will not be allowed to drive home.    Special instructions:  [place]- Preparing For Surgery  Before surgery, you can play an important role. Because skin is not sterile, your skin needs to be as free of germs as possible. You can reduce the number of germs on your skin by washing with CHG (chlorahexidine gluconate) Soap before surgery.  CHG is an antiseptic cleaner which kills germs and bonds with the skin to continue killing germs even after washing.  Please do not use if you have an allergy to CHG or antibacterial soaps. If your skin becomes reddened/irritated stop using the CHG.   Do not shave (including legs and underarms) for at least 48 hours prior to first CHG shower. It is OK to shave your face.  Please follow these instructions carefully.   1. Shower the NIGHT BEFORE SURGERY and the MORNING OF SURGERY with CHG.   2. If you chose to wash your hair, wash your hair first as usual with your normal shampoo.  3. After you shampoo, rinse your hair and body thoroughly to remove the shampoo.  4. Use CHG as you would any other liquid soap. You can apply CHG directly to the skin and wash gently with a scrungie or a clean washcloth.   5. Apply the CHG Soap to your body ONLY FROM THE NECK DOWN.  Do not use on open wounds or open sores. Avoid contact with your eyes, ears, mouth and genitals (private parts). Wash genitals (private parts) with your normal soap.  6. Wash thoroughly, paying special attention to the area where your surgery will be performed.  7. Thoroughly rinse your body with warm water from the neck down.  8. DO NOT shower/wash with your normal soap after using and rinsing off the CHG Soap.  9. Pat yourself dry with a CLEAN TOWEL.   10. Wear CLEAN PAJAMAS   11. Place CLEAN SHEETS on your bed the night of your first shower and DO NOT SLEEP WITH PETS.    Day of Surgery:  Do not apply any deodorants/lotions. Please wear clean clothes to the hospital/surgery center.      Please read over the following fact sheets that you were given. Coughing and Deep Breathing, MRSA Information and Surgical Site Infection Prevention

## 2016-10-05 NOTE — Telephone Encounter (Signed)
He wanted to let Dr [name] know that he had his colonoscopy and wanted to see if she had seen them.

## 2016-10-06 ENCOUNTER — Encounter (HOSPITAL_COMMUNITY)
Admission: RE | Admit: 2016-10-06 | Discharge: 2016-10-06 | Disposition: A | Discharge status: Home / Self Care | Payer: Medicare Other | Source: Ambulatory Visit | Attending: Neurosurgery | Admitting: Neurosurgery

## 2016-10-06 ENCOUNTER — Encounter (HOSPITAL_COMMUNITY): Payer: Self-pay

## 2016-10-06 DIAGNOSIS — M5136 Other intervertebral disc degeneration, lumbar region: Secondary | ICD-10-CM | POA: Diagnosis not present

## 2016-10-06 DIAGNOSIS — Z01812 Encounter for preprocedural laboratory examination: Secondary | ICD-10-CM | POA: Diagnosis not present

## 2016-10-06 LAB — COMPREHENSIVE METABOLIC PANEL
ALT: 12 U/L — ABNORMAL LOW (ref 17–63)
AST: 25 U/L (ref 15–41)
Albumin: 4.1 g/dL (ref 3.5–5.0)
Alkaline Phosphatase: 42 U/L (ref 38–126)
Anion gap: 8 (ref 5–15)
BUN: 23 mg/dL — ABNORMAL HIGH (ref 6–20)
CO2: 26 mmol/L (ref 22–32)
Calcium: 9.4 mg/dL (ref 8.9–10.3)
Chloride: 103 mmol/L (ref 101–111)
Creatinine, Ser: 1.27 mg/dL — ABNORMAL HIGH (ref 0.61–1.24)
GFR calc Af Amer: >60 mL/min (ref >60)
GFR calc non Af Amer: 52 mL/min — ABNORMAL LOW (ref >60)
Glucose, Bld: 98 mg/dL (ref 65–99)
Potassium: 4.7 mmol/L (ref 3.5–5.1)
Sodium: 137 mmol/L (ref 135–145)
Total Bilirubin: 0.4 mg/dL (ref 0.3–1.2)
Total Protein: 6.8 g/dL (ref 6.5–8.1)

## 2016-10-06 LAB — CBC
HCT: 30.7 % — ABNORMAL LOW (ref 39.0–52.0)
Hemoglobin: 9.7 g/dL — ABNORMAL LOW (ref 13.0–17.0)
MCH: 29.2 pg (ref 26.0–34.0)
MCHC: 31.6 g/dL (ref 30.0–36.0)
MCV: 92.5 fL (ref 78.0–100.0)
Platelets: 130 10*3/uL — ABNORMAL LOW (ref 150–400)
RBC: 3.32 MIL/uL — ABNORMAL LOW (ref 4.22–5.81)
RDW: 13.7 % (ref 11.5–15.5)
WBC: 3.5 10*3/uL — ABNORMAL LOW (ref 4.0–10.5)

## 2016-10-06 LAB — SURGICAL PCR SCREEN
MRSA, PCR: NEGATIVE
Staphylococcus aureus: NEGATIVE

## 2016-10-06 LAB — ABO/RH: ABO/RH(D): O POS

## 2016-10-06 NOTE — Telephone Encounter (Signed)
Called patient to let him know MD has seen his reports.

## 2016-10-06 NOTE — Progress Notes (Signed)
PCP:Dr. [name] [place] in [facility] Cardiologist: Dr. Toma Deiters [facility] Nephrologist: Dr. Candiss Norse in [place]  Pt.instructed to call DR. Cram for instructions when to stop aspirin.

## 2016-10-07 NOTE — Progress Notes (Addendum)
Anesthesia chart review: Patient is a 76 year old male scheduled for L5-S1 PLIF on 10/14/16 by Dr. Saintclair Halsted. He is s/p right L5-S1 laminectomy and foraminotomy on 03/08/16.  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 Shanon Brow; [facility]), moderate aortic stenosis and moderately dilated aortic root (11/2015 echo),  paroxysmal atrial fib (post cardiac surgery), hypertension, hyperlipidemia, glaucoma, depression, chronic back pain, rheumatoid and osteoarthritis, appendectomy '10, cholecystectomy '10, hammer toe with K wire fixation 10/09/15, right L5-S1 laminectomy/foraminotomy 03/08/16.  He has moderate pulmonary hypertension by 2016 and 2017 echocardiograms. He reported memory loss following his May 2017 surgery (done under MAC anesthesia; midazolam, propofol, lidocaine). He apparently had to be admitted 10/11/15-10/13/15 for altered mental status following surgery. He elevated Cr with nausea and poor PO intake requiring hydration.   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 He lives in [place].  - PCP is Dr. [name], last visit 09/01/16. - Cardiologist is Dr. Ida Rogue, last visit 05/05/16 with six month follow-up planned. Patient had mild AS and dilated ascending TAA on 11/2015 echo with repeat echo in one year recommended.  - Neurologist is Dr. Gurney Maxin, last visit with Rufina Falco, NP on 04/30/16. - Rheumatologist is Dr. Percell Boston, last visit 09/23/16. - Nephrologist is Dr. Jac Canavan with [facility]. As of 12/2015, patient was overall classified as CKD stage III with baseline Cr was ~ 1.6-1.7, GFR 42 with fuctuations due to variation in volume status from cardiopulmonary and renal hemodynamics.  - Hematologist is Dr. Nolon Stalls with [facility],  last visit 08/19/16 for evaluation of anemia and asymmetric left rectal wall thickening on CT. Patient referred to GI and discussed consideration of bone marrow biopsy if counts do not improve.  - Gastroenterologist is Dr. Jonathon Bellows. He had EGD/colonoscopy 09/24/16 for evaluation of iron deficiency anemia. Colonoscopy showed non-bleeding internal hemorrhoids, diverticulosis, 3 mm cecal polyp s/p removal (tubular adenoma, negative for high grade dysplasia and malignancy). EGD showed monilial esophagitis (mild chronic gastritis/healing mucosal injury, negative for H. Pylori, dysplasia, malignancy).   Meds include amlodipine, aspirin 81 mg, Amoxicillin (PRN dental procedures), Alphagan ophthalmic, Zrytec, fenofibrate, Duragesic 25 mcg/hr patch, ferrous sulfate, Prozac, folate acid, gabapentin, hydralazine, Norco, lisinopril, magnesium, omeprazole, prednisone (PRN RA flare), simvastatin, Flomax, trazodone.  BP 127/62   Pulse (!) 56   Temp 36.6 C (Oral)   Resp 18   Ht _0  (1.803 m)   Wt 179 lb 3 oz (81.3 kg)   SpO2 99%   BMI 24.99 kg/m   EKG 05/05/16: SB at 57 bpm, first degree AV block with occasional PVCs, incomplete left BBB. Inverted T waves in III, aVF, and V5-6 have improved since 11/06/15 tracing.  Echo 11/27/15: Study Conclusions - Left ventricle: The cavity size was mildly dilated. Systolic function was normal. The estimated ejection fraction was in the range of 50% to 55%. Wall motion was normal; there were no regional wall motion abnormalities. Features are consistent with a pseudonormal left ventricular filling pattern, with concomitant abnormal relaxation and increased filling pressure (grade 2 diastolic dysfunction). - Aortic valve: A bioprosthesis was present. There was moderate stenosis. Mean gradient (S): 20 mm Hg. Valve area (VTI): 1.24 cm^2. - Aorta: Aortic root dimension: 44 mm (ED). - Aortic root: The aortic root was moderately dilated. - Mitral valve:  There was mild regurgitation. - Left atrium:  The atrium was moderately dilated. - Right atrium: The atrium was mildly dilated. - Pulmonary arteries: Systolic pressure was moderately increased. PA peak pressure: 53 mm Hg (S). Impressions: - Relatively stable findings since last year except for increased pulmonary pressure from 42 to 53 mm Hg. (Dr. [name] felt findings had not significantly changed in the past year. Right heart pressure elevated, likely from history of smoking, prosthetic valve, renal dysfunction. Could repeat in one year.)  By notes, he had mild to moderate non-obstructive CAD by cath done prior to AVR/TAA repair.   Carotid U/S 06/03/15: Impressions: Normal carotid arteries, bilaterally. Patent vertebral arteries with antegrade flow. Normal subclavian arteries, bilaterally.  CT (non-contrast) chest/abd/pelvis 08/09/16: IMPRESSION: 1. No acute findings or clear evidence of malignancy in the chest, abdomen or pelvis. 2. Asymmetric left rectal wall thickening difficult to exclude, although may be secondary to volume averaging with the prostate gland. Correlation with digital rectal examination recommended. 3. Mild emphysema and subpleural reticulation in both lung bases. 4. Atherosclerosis as described. 5. Involuting left renal cyst. 6. Small amount fluid in the right inguinal canal with probable adjacent right inguinal reactive lymph nodes  Preoperative labs noted. BUN 23, Cr 1.27. H/H 9.7/30.7 (9.9/28.9 on 09/09/16; previous range 9.5-10.6/27.8.-32.7 since 04/2016). PLT 130K. Glucose 98. T&S done. I have called H/H results to Lorriane Shire at Dr. Windy Carina office. He may want H/H higher prior to lumbar fusion or to plan pre/peri-operative transfusion. Prepare PRBC order entered, but will defer transfusion order to surgeon/anesthesiologist.   He does have (recurrent) moderate AS/moderate TAA and moderate pulmonary hypertension by 11/2015 echo. Renal function appears  stable.Anemia appears overall stable since 04/2016, but may need pre/peri-operative transfusion. Lorriane Shire to follow-up with Dr. Saintclair Halsted once he is back in town on 10/11/16. Discussed available information with anesthesiologist Dr. Smith Robert. Since patient is scheduled to see Dr. [name] in just a few weeks, recommend getting his input regarding if he would like to see patient prior to surgery. I have sent Dr. [name] a staff message and notified Lorriane Shire.   [facility] Short Stay Center/Anesthesiology Phone [phone] 10/07/2016 5:36 PM  Addendum: Per Dr. [name]: He is acceptable risk for surgery No further testing needed He will likely require preop antibiotics given his bioprosthetic aortic valve  He was seen at [facility] by Nolon Stalls, NP with repeat labs on 10/12/16. H/H stable at 9.7/27.8. PLT 131. Hematology note is not yet completed in Epic, so I contacted Lorriane Shire at Dr. Windy Carina office since I could see that they have been in communication with hematology. Per Lorriane Shire there was a discussion with Dr. Saintclair Halsted following hematology visit yesterday, and the plan was to proceed with surgery as scheduled. PRBCs will be available if needed.  [facility] Short Stay Center/Anesthesiology Phone [phone] 10/13/2016 12:52 PM

## 2016-10-08 ENCOUNTER — Telehealth: Payer: Self-pay | Admitting: *Deleted

## 2016-10-08 ENCOUNTER — Telehealth: Payer: Self-pay | Admitting: Cardiovascular Disease

## 2016-10-08 NOTE — Telephone Encounter (Signed)
Spoke to them. I have messaged dr. [name] to speak to the on Monday and see if she wants to do anything prior to surgery. Too short notice for 10/14/16

## 2016-10-08 NOTE — Telephone Encounter (Signed)
Called to report that Mr Minion is scheduled for surgery next week and pre op labs shows HGB of 9.7, Dr Cran wants Dr Humberto Seals to manage this before surgery 10/14/16. Lorriane Shire would like a return call [phone] in regards to this matter

## 2016-10-08 NOTE — Telephone Encounter (Signed)
Received cardiac clearance request for pt to proceed w/ L5-S1 lumbar fusion on 10/14/16. Please route clearance to [place] @ [phone].

## 2016-10-11 ENCOUNTER — Ambulatory Visit: Payer: Medicare Other | Admitting: Hematology and Oncology

## 2016-10-11 ENCOUNTER — Other Ambulatory Visit: Payer: Medicare Other

## 2016-10-11 NOTE — Telephone Encounter (Signed)
Routed to fax # provided. 

## 2016-10-11 NOTE — Telephone Encounter (Signed)
He is acceptable risk for surgery No further testing needed He will likely require preop antibiotics given his bioprosthetic aortic valve

## 2016-10-11 NOTE — Telephone Encounter (Signed)
Per Dr Elizebeth Brooking office Lorriane Shire states he is fine at 9.7, but wanted Dr Kem Parkinson input. She reports that they plan on using the cell saver during surgery and are not expecting much blood loss so everything is good to go

## 2016-10-12 ENCOUNTER — Inpatient Hospital Stay (HOSPITAL_BASED_OUTPATIENT_CLINIC_OR_DEPARTMENT_OTHER): Payer: Medicare Other | Admitting: Hematology and Oncology

## 2016-10-12 ENCOUNTER — Encounter: Payer: Self-pay | Admitting: Hematology and Oncology

## 2016-10-12 ENCOUNTER — Telehealth: Payer: Self-pay

## 2016-10-12 ENCOUNTER — Other Ambulatory Visit: Payer: Self-pay

## 2016-10-12 ENCOUNTER — Inpatient Hospital Stay: Payer: Medicare Other | Attending: Hematology and Oncology

## 2016-10-12 VITALS — BP 123/71 | HR 59 | Temp 97.7°F | Resp 18 | Wt 183.5 lb

## 2016-10-12 DIAGNOSIS — B370 Candidal stomatitis: Secondary | ICD-10-CM

## 2016-10-12 DIAGNOSIS — D509 Iron deficiency anemia, unspecified: Secondary | ICD-10-CM | POA: Diagnosis not present

## 2016-10-12 DIAGNOSIS — I480 Paroxysmal atrial fibrillation: Secondary | ICD-10-CM | POA: Diagnosis not present

## 2016-10-12 DIAGNOSIS — H409 Unspecified glaucoma: Secondary | ICD-10-CM | POA: Diagnosis not present

## 2016-10-12 DIAGNOSIS — F17210 Nicotine dependence, cigarettes, uncomplicated: Secondary | ICD-10-CM

## 2016-10-12 DIAGNOSIS — D61818 Other pancytopenia: Secondary | ICD-10-CM | POA: Diagnosis not present

## 2016-10-12 DIAGNOSIS — M069 Rheumatoid arthritis, unspecified: Secondary | ICD-10-CM | POA: Diagnosis not present

## 2016-10-12 DIAGNOSIS — Z79899 Other long term (current) drug therapy: Secondary | ICD-10-CM

## 2016-10-12 DIAGNOSIS — I2510 Atherosclerotic heart disease of native coronary artery without angina pectoris: Secondary | ICD-10-CM | POA: Diagnosis not present

## 2016-10-12 DIAGNOSIS — K2950 Unspecified chronic gastritis without bleeding: Secondary | ICD-10-CM | POA: Diagnosis not present

## 2016-10-12 DIAGNOSIS — Z7982 Long term (current) use of aspirin: Secondary | ICD-10-CM | POA: Diagnosis not present

## 2016-10-12 DIAGNOSIS — M48061 Spinal stenosis, lumbar region without neurogenic claudication: Secondary | ICD-10-CM | POA: Diagnosis not present

## 2016-10-12 DIAGNOSIS — I712 Thoracic aortic aneurysm, without rupture: Secondary | ICD-10-CM | POA: Insufficient documentation

## 2016-10-12 DIAGNOSIS — L97519 Non-pressure chronic ulcer of other part of right foot with unspecified severity: Secondary | ICD-10-CM

## 2016-10-12 DIAGNOSIS — K5730 Diverticulosis of large intestine without perforation or abscess without bleeding: Secondary | ICD-10-CM | POA: Insufficient documentation

## 2016-10-12 DIAGNOSIS — I10 Essential (primary) hypertension: Secondary | ICD-10-CM | POA: Diagnosis not present

## 2016-10-12 DIAGNOSIS — E785 Hyperlipidemia, unspecified: Secondary | ICD-10-CM | POA: Diagnosis not present

## 2016-10-12 DIAGNOSIS — D649 Anemia, unspecified: Secondary | ICD-10-CM

## 2016-10-12 DIAGNOSIS — Z952 Presence of prosthetic heart valve: Secondary | ICD-10-CM | POA: Diagnosis not present

## 2016-10-12 LAB — CBC WITH DIFFERENTIAL/PLATELET
Basophils Absolute: 0 10*3/uL (ref 0–0.1)
Basophils Relative: 1 %
Eosinophils Absolute: 0.1 10*3/uL (ref 0–0.7)
Eosinophils Relative: 3 %
HCT: 27.8 % — ABNORMAL LOW (ref 40.0–52.0)
Hemoglobin: 9.7 g/dL — ABNORMAL LOW (ref 13.0–18.0)
Lymphocytes Relative: 22 %
Lymphs Abs: 0.7 10*3/uL — ABNORMAL LOW (ref 1.0–3.6)
MCH: 31.5 pg (ref 26.0–34.0)
MCHC: 34.7 g/dL (ref 32.0–36.0)
MCV: 90.8 fL (ref 80.0–100.0)
Monocytes Absolute: 0.3 10*3/uL (ref 0.2–1.0)
Monocytes Relative: 9 %
Neutro Abs: 2.2 10*3/uL (ref 1.4–6.5)
Neutrophils Relative %: 65 %
Platelets: 131 10*3/uL — ABNORMAL LOW (ref 150–440)
RBC: 3.06 MIL/uL — ABNORMAL LOW (ref 4.40–5.90)
RDW: 13.6 % (ref 11.5–14.5)
WBC: 3.3 10*3/uL — ABNORMAL LOW (ref 3.8–10.6)

## 2016-10-12 MED ORDER — FLUCONAZOLE 200 MG PO TABS: 200.0000 mg | ORAL_TABLET | Freq: Every day | ORAL | 0 refills | Status: AC

## 2016-10-12 NOTE — Progress Notes (Signed)
[facility] day:  10/12/2016  Chief Complaint: Bryan [name] is a 76 y.o. male with rheumatoid arthritis, iron deficiency anemia, and mild pancytopenia who is seen for 1 month assessment  HPI:  The patient was last seen in the hematology clinic on 09/09/2016.  At that time, he had arthritis pain.  He was off Plaquenil.  He had chronic mild pancytopenia.  He was scheduled for endoscopy with Dr. Jonathon Bellows.  CBC revealed a hematocrit of 28.9, hemoglobin 9.9, MCV 90.3, platelets 128,000, WBC 2500 with an ANC of 1200.  Sed rate was 15.  Ferritin was 110.  Otelia Sergeant studies were normal.  Free light chain ratio was normal.  PSA was < 0.1.  EGD on 09/24/2016 revealed patchy candidiasis in the entire esophagus.  There were two non-bleeding angioectasias in the duodenum treated with argon plasma coagulation.  Gastritis was biopsied.  Pathology revealed mild chronic gastritis negative for H pylori, dysplasia and malignancy.  Colonoscopy on 09/24/2016 revealed diverticulosis in the entire colon, one 3 mm polyp in the cecum, and non-bleeding internal hemorrhoids.  Pathology from the cecal polyp revealed a tubular adenoma negative for high grade dysplasia and malignancy.  Prilosec 40 mg daily x 6 weeks was recommended.  He is scheduled for L5-S1 fusion secondary to leg, hip, and knee pain.  Surgery is being performed by Dr. Saintclair Halsted, neurosurgeon in [place].   Past Medical History:  Diagnosis Date  . Allergy   . Arrhythmia   . 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 Chronic back pain   . Complication of anesthesia    Memory loss 09/2015  . Coronary artery disease   . Glaucoma   . H/O thoracic aortic aneurysm repair   . History of being hospitalized    memory lose kidney funtion down blood pressure up  . History of blood transfusion   . History of chicken pox   . Hyperlipidemia   . Hypertension   . Lumbar stenosis   . Paroxysmal  A-fib ([name])   . Valvular heart disease     Past Surgical History:  Procedure Laterality Date  . AORTIC VALVE REPLACEMENT  2007   [facility].  [place], [place]  . APPENDECTOMY  2010  . CATARACT EXTRACTION W/ INTRAOCULAR LENS  IMPLANT, BILATERAL    . CHOLECYSTECTOMY  2010  . COLONOSCOPY WITH PROPOFOL N/A 09/24/2016   Procedure: COLONOSCOPY WITH PROPOFOL;  Surgeon: Jonathon Bellows, MD;  Location[facility] ENDOSCOPY;  Service: Endoscopy;  Laterality: N/A;  . ESOPHAGOGASTRODUODENOSCOPY (EGD) WITH PROPOFOL N/A 09/24/2016   Procedure: ESOPHAGOGASTRODUODENOSCOPY (EGD) WITH PROPOFOL;  Surgeon: Jonathon Bellows, MD;  Location: [facility] ENDOSCOPY;  Service: Endoscopy;  Laterality: N/A;  . EYE SURGERY    . GIVENS CAPSULE STUDY N/A 09/24/2016   Procedure: GIVENS CAPSULE STUDY;  Surgeon: Jonathon Bellows, MD;  Location: [facility] ENDOSCOPY;  Service: Endoscopy;  Laterality: N/A;  . HAMMER TOE SURGERY Right 10/09/2015   Procedure: HAMMER TOE REPAIR WITH K-WIRE FIXATION RIGHT SECOND TOE;  Surgeon: Albertine Patricia, DPM;  Location: [facility];  Service: Podiatry;  Laterality: Right;  WITH LOCAL  . LUMBAR LAMINECTOMY/DECOMPRESSION MICRODISCECTOMY Right 03/08/2016   Procedure: Laminectomy and Foraminotomy - Lumbar Five-Sacral One Right;  Surgeon: Kary Kos, MD;  Location: [place];  Service: Neurosurgery;  Laterality: Right;  Right  . MULTIPLE TOOTH EXTRACTIONS    . REPLACEMENT TOTAL KNEE Left 2003  . THORACIC AORTIC ANEURYSM REPAIR  2010  Family History  Problem Relation Age of Onset  . Heart failure Mother   . Hypertension Mother   . Asthma Mother   . Heart attack Father 24       MI  . Hypertension Sister     Social History:  reports that he quit smoking about 35 years ago. His smoking use included Cigarettes. He has a 32.00 pack-year smoking history. He has never used smokeless tobacco. He reports that he drinks about 0.6 oz of alcohol per week . He reports that he does not use drugs.  He smoked 1 pack a day x 12 years.   He stopped smoking 30 years ago.  He drinks a glass of wine occasionally.  He is a Software engineer.  [place]  The patient is alone today.  Allergies:  Allergies  Allergen Reactions  . Penicillins Hives and Swelling    SWELLING REACTION UNSPECIFIED PATIENT HAS TAKEN AMOXICILLIN ON MED HX FROM [facility] Has patient had a PCN reaction causing immediate rash, facial/tongue/throat swelling, SOB or lightheadedness with hypotension: Yes Has patient had a PCN reaction causing severe rash involving mucus membranes or skin necrosis: No Has patient had a PCN reaction that required hospitalization No Has patient had a PCN reaction occurring within the last 10 years: No If all of the above answers are "NO", then may proceed with  . Demerol [Meperidine] Hives and Nausea And Vomiting    Current Medications: Current Outpatient Prescriptions  Medication Sig Dispense Refill  . amLODipine (NORVASC) 5 MG tablet Take 1 tablet (5 mg total) by mouth daily. 90 tablet 3  . b complex vitamins tablet Take 1 tablet by mouth daily.    . brimonidine (ALPHAGAN) 0.2 % ophthalmic solution Place 1 drop into both eyes every morning.     . cetirizine (ZYRTEC) 10 MG tablet Take 10 mg by mouth daily.    . cholecalciferol (VITAMIN D) 400 units TABS tablet Take 400 Units by mouth daily.    . fenofibrate 160 MG tablet Take 1 tablet (160 mg total) by mouth daily. 90 tablet 1  . fentaNYL (DURAGESIC - DOSED MCG/HR) 25 MCG/HR patch Apply one patch to skin every 3 days if tolerated (Patient taking differently: Place 25 mcg onto the skin every 3 (three) days. Apply one patch to skin every 3 days if tolerated) 10 patch 0  . ferrous sulfate 325 (65 FE) MG tablet Take 325 mg by mouth daily with breakfast.    . FLUoxetine (PROZAC) 20 MG capsule TAKE 1 CAPSULE BY MOUTH EVERY DAY 90 capsule 1  . folic acid (FOLVITE) 1 MG tablet Take 1 mg by mouth 2 (two) times daily.    Marland Kitchen gabapentin (NEURONTIN)  800 MG tablet Limit 1 tablet by mouth 2-3 times per day if tolerated (Patient taking differently: Take 800 mg by mouth 3 (three) times daily. ) 270 tablet 0  . hydrALAZINE (APRESOLINE) 25 MG tablet Take 1 tablet (25 mg total) by mouth 3 (three) times daily. 90 tablet 3  . hydrocortisone cream 1 % Apply 1 application topically daily as needed for itching.    Marland Kitchen lisinopril (PRINIVIL,ZESTRIL) 20 MG tablet Take 1 tablet (20 mg total) by mouth every evening. 90 tablet 3  . Magnesium 400 MG CAPS Take 400 mg by mouth daily.     Marland Kitchen omeprazole (PRILOSEC) 40 MG capsule Take 40 mg by mouth daily.    . simvastatin (ZOCOR) 40 MG tablet Take 1  tablet (40 mg total) by mouth at bedtime. 90 tablet 3  . tamsulosin (FLOMAX) 0.4 MG CAPS capsule TAKE 1 CAPSULE BY MOUTH EVERY DAY 90 capsule 1  . traZODone (DESYREL) 50 MG tablet Take 100 mg by mouth at bedtime as needed for sleep. Reported on 10/09/2015    . vitamin B-12 (CYANOCOBALAMIN) 1000 MCG tablet Take 1,000 mcg by mouth daily.    . vitamin C (ASCORBIC ACID) 500 MG tablet Take 500 mg by mouth daily.    Marland Kitchen amoxicillin (AMOXIL) 500 MG capsule Take 2,000 mg by mouth as needed (prior to dental procedures). Reported on 11/17/2015    . aspirin EC 81 MG tablet Take 81 mg by mouth daily.    Marland Kitchen HYDROcodone-acetaminophen (NORCO/VICODIN) 5-325 MG tablet Limit  2-4 tablets by mouth per day for breakthrough pain while wearing fentanyl patch if tolerated (Patient not taking: Reported on 10/12/2016) 120 tablet 0  . predniSONE (DELTASONE) 5 MG tablet Take 5 mg by mouth daily as needed. Reported on 11/17/2015     Current Facility-Administered Medications  Medication Dose Route Frequency Provider Last Rate Last Dose  . fentaNYL (SUBLIMAZE) injection 100 mcg  100 mcg Intravenous Once Mohammed Kindle, MD      . lactated ringers infusion 1,000 mL  1,000 mL Intravenous Continuous Mohammed Kindle, MD 125 mL/hr at 01/19/16 1011 1,000 mL at 01/19/16 1011  . midazolam (VERSED) 5 MG/5ML injection 5  mg  5 mg Intravenous Once Mohammed Kindle, MD        Review of Systems:  GENERAL:  Feels"ok".  No fevers.  Night sweats.  Weight loss of 3 pounds since last visit. PERFORMANCE STATUS (ECOG):  1 HEENT:  No visual changes, sore throat, mouth sores or tenderness. Lungs: Shortness of breath with exertion.  No cough.  No hemoptysis. Cardiac:  Aortic valve replacement.  No chest pain, palpitations, orthopnea, or PND. GI:  Irritable bowel.  No nausea, vomiting, diarrhea, constipation, melena or hematochezia. GU:  No urgency,dysuria, or hematuria. Musculoskeletal:  Chronic back problems.  Knee pain.  Severe rheumatoid arthritis.  Stiff in morning.  No muscle tenderness. Extremities:  No pain or swelling. Skin:  After bath itching.  No rashes or skin changes. Neuro:  Cane for balance.  No headache, numbness or weakness, balance or coordination issues. Endocrine:  No diabetes, thyroid issues, hot flashes or night sweats. Psych:  No mood changes, depression or anxiety. Pain:  Pain in lower back, feet, knees and ankles (5 out of 10). Review of systems:  All other systems reviewed and found to be negative.  Physical Exam: Blood pressure 123/71, pulse (!) 59, temperature 97.7 F (36.5 C), temperature source Tympanic, resp. rate 18, weight 183 lb 8 oz (83.2 kg). GENERAL:  Elderly gentleman, sitting comfortably in the exam room in no acute distress. MENTAL STATUS:  Alert and oriented to person, place and time. HEAD:  Gray/white hair.  Thin beard.  Normocephalic, atraumatic, face symmetric, no Cushingoid features. EYES:  Hazel eyes.  Pupils equal round and reactive to light and accomodation.  No conjunctivitis or scleral icterus. ENT:  Oropharynx clear without lesion.  Tongue normal. Mucous membranes moist.  RESPIRATORY:  Clear to auscultation without rales, wheezes or rhonchi. CARDIOVASCULAR:  Regular rate and rhythm without murmur, rub or gallop. ABDOMEN:  Soft, non-tender, with active bowel sounds,  and no hepatosplenomegaly.  No masses. SKIN:  No rashes, ulcers or lesions. EXTREMITIES: Ulnar deviation.  No edema, no skin discoloration or tenderness.  No palpable cords. LYMPH NODES:  No palpable cervical, supraclavicular, axillary or inguinal adenopathy  NEUROLOGICAL: Unremarkable. PSYCH:  Appropriate.   Appointment on 10/12/2016  Component Date Value Ref Range Status  . WBC 10/12/2016 3.3* 3.8 - 10.6 K/uL Final  . RBC 10/12/2016 3.06* 4.40 - 5.90 MIL/uL Final  . Hemoglobin 10/12/2016 9.7* 13.0 - 18.0 g/dL Final  . HCT 10/12/2016 27.8* 40.0 - 52.0 % Final  . MCV 10/12/2016 90.8  80.0 - 100.0 fL Final  . MCH 10/12/2016 31.5  26.0 - 34.0 pg Final  . MCHC 10/12/2016 34.7  32.0 - 36.0 g/dL Final  . RDW 10/12/2016 13.6  11.5 - 14.5 % Final  . Platelets 10/12/2016 131* 150 - 440 K/uL Final  . Neutrophils Relative % 10/12/2016 65  % Final  . Neutro Abs 10/12/2016 2.2  1.4 - 6.5 K/uL Final  . Lymphocytes Relative 10/12/2016 22  % Final  . Lymphs Abs 10/12/2016 0.7* 1.0 - 3.6 K/uL Final  . Monocytes Relative 10/12/2016 9  % Final  . Monocytes Absolute 10/12/2016 0.3  0.2 - 1.0 K/uL Final  . Eosinophils Relative 10/12/2016 3  % Final  . Eosinophils Absolute 10/12/2016 0.1  0 - 0.7 K/uL Final  . Basophils Relative 10/12/2016 1  % Final  . Basophils Absolute 10/12/2016 0.0  0 - 0.1 K/uL Final    Assessment:  Bryan [name] is a 76 y.o. male with rheumatoid arthritis and progressive anemia over the past 2 years.  He has been on Plaquenil and hydralazine which can cause anemia.  Plaquenil was discontinued on 08/26/2016.  He denies any exposure to radiation or toxins.  He denies any prior history of hepatitis, prior transfusions or HIV risk factors.  He denies any herbal products.  Diet is good.  EGD on 09/24/2016 revealed patchy candidiasis in the entire esophagus.  There were two non-bleeding angioectasias in the duodenum treated with argon plasma coagulation.  Gastritis was biopsied.   Pathology revealed mild chronic gastritis negative for H pylori, dysplasia and malignancy.  Colonoscopy on 09/24/2016 revealed diverticulosis in the entire colon, one 3 mm polyp in the cecum, and non-bleeding internal hemorrhoids.  Pathology from the cecal polyp revealed a tubular adenoma negative for high grade dysplasia and malignancy.    He denies any melena or hematochezia. He denies any hematuria.  CBC on 06/02/2016 revealed a hematocrit of 29.5, hemoglobin 10.0, MCV 92.3, platelets 155,000, and WBC 4200.  Stool was guaiac negative x 2 in 06/2016.  Labs on 06/04/2016 revealed a ferritin of 124, iron saturation 22% and TIBC of 370.  B12 was 883 on 06/18/2014.  Work-up on 07/19/2016 revealed a hematocrit of 31.7, hemoglobin 10.6, MCV 91, platelets 143,000, white count 3000 with an ANC of 1800.  Absolute lymphocyte count was 700 (low). Creatinine was 1.57.  LDH was 269 (98-192).  Normal labs included:  uric acid, folate, Coombs, SPEP, and copper.  Iron studies included a saturation of 11% (low) and a TIBC of 454 (high) c/w iron deficiency anemia.  Sed rate was 19.  Retic was 0.9% (low).  He is on oral iron with vitamin C.  Chest, abdomen, and pelvic CTon 08/09/2016 revealed no acute findings or clear evidence of malignancy in the chest, abdomen or pelvis. There was asymmetric left rectal wall thickeningpossibly secondary to volume averaging with the prostate gland.  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  He has treated with Septra then switched to doxycycline.  Symptomatically, he has arthritis pain.  He is scheduled  for L5-S1 fusion.  He has chronic mild pancytopenia.  Plan: 1.  Labs today:  CBC with diff. 2.  Review interval EGD and colonoscopy. 3.  RTC on 06/01 for MD assessment, labs (CBC with diff) 4.  Schedule bone marrow biopsy on 10/25/2016. 5.  RTC 10 days after bone marrow for review of results.   Lequita Asal, MD  10/12/2016, 11:26 AM

## 2016-10-12 NOTE — Progress Notes (Signed)
Complains of pain 5/10 in lower back, feet, knees and ankles. Stated from his arthritis, reports not taking vitamins and asa this week for surgery later this week, for L5 S1 fusion surgery at [facility].

## 2016-10-12 NOTE — Telephone Encounter (Signed)
Pt order for Diflucan 200 mg x 14 days sent to pharmacy.

## 2016-10-12 NOTE — Telephone Encounter (Signed)
-----   Message from Jonathon Bellows, MD sent at 10/12/2016 11:05 AM EDT ----- Regarding: please call in  [place] please inform pt Needs Diflucan 200 mg daily for 14 days for candidiasis of the esophagus based on brushings . He also needs a follow up appointment please arrange  Dr Jonathon Bellows  Gastroenterology/Hepatology Pager: [phone]   C/c Dr [name]

## 2016-10-13 ENCOUNTER — Telehealth: Payer: Self-pay | Admitting: Gastroenterology

## 2016-10-13 NOTE — Telephone Encounter (Signed)
Patient left a voice message and wanted to know the location that Dr. Vicente Males found the yeast infection. He couldn't remember. Please call

## 2016-10-13 NOTE — Telephone Encounter (Signed)
LVM for patient callback to answer question about location of yeast.  candidiasis of the esophagus

## 2016-10-13 NOTE — Anesthesia Preprocedure Evaluation (Signed)
Anesthesia Evaluation  Patient identified by MRN, date of birth, ID band Patient awake    Reviewed: Allergy & Precautions, NPO status , Patient's Chart, lab work & pertinent test results  History of Anesthesia Complications (+) Emergence Delirium and history of anesthetic complications  Airway Mallampati: II  TM Distance: >3 FB Neck ROM: Full    Dental no notable dental hx.    Pulmonary neg pulmonary ROS, former smoker,    Pulmonary exam normal        Cardiovascular hypertension, + Peripheral Vascular Disease (ascending aortic aneurysm) and +CHF  Normal cardiovascular exam+ Valvular Problems/Murmurs (moderate AS) AS   The cavity size was mildly dilated. Systolic   function was normal. The estimated ejection fraction was in the   range of 50% to 55%. Wall motion was normal; there were no   regional wall motion abnormalities. Features are consistent with   a pseudonormal left ventricular filling pattern, with concomitant   abnormal relaxation and increased filling pressure (grade 2   diastolic dysfunction). - Aortic valve: A bioprosthesis was present. There was moderate   stenosis. Mean gradient (S): 20 mm Hg. Valve area (VTI): 1.24   cm^2. - Aorta: Aortic root dimension: 44 mm (ED). - Aortic root: The aortic root was moderately dilated. - Mitral valve: There was mild regurgitation. - Left atrium: The atrium was moderately dilated. - Right atrium: The atrium was mildly dilated. - Pulmonary arteries: Systolic pressure was moderately increased.   PA peak pressure: 53 mm Hg (s).  Cardiology cleared   Neuro/Psych PSYCHIATRIC DISORDERS Depression negative neurological ROS  negative psych ROS   GI/Hepatic negative GI ROS, Neg liver ROS,   Endo/Other  negative endocrine ROS  Renal/GU CRFRenal disease  negative genitourinary   Musculoskeletal negative musculoskeletal ROS (+) Arthritis ,   Abdominal   Peds negative  pediatric ROS (+)  Hematology negative hematology ROS (+) Blood dyscrasia, anemia ,   Anesthesia Other Findings   Reproductive/Obstetrics negative OB ROS                             Anesthesia Physical  Anesthesia Plan  ASA: III  Anesthesia Plan: General   Post-op Pain Management:    Induction: Intravenous  Airway Management Planned: Oral ETT  Additional Equipment:   Intra-op Plan:   Post-operative Plan: Extubation in OR  Informed Consent: I have reviewed the patients History and Physical, chart, labs and discussed the procedure including the risks, benefits and alternatives for the proposed anesthesia with the patient or authorized representative who has indicated his/her understanding and acceptance.   Dental advisory given  Plan Discussed with:   Anesthesia Plan Comments:         Anesthesia Quick Evaluation Explained the increased likelyhood that he will experience emergence delirium. Will minimize sedating medications. Patient voiced understanding and opted to proceed with general anesthesia.

## 2016-10-14 ENCOUNTER — Inpatient Hospital Stay (HOSPITAL_COMMUNITY)
Admission: RE | Disposition: A | Discharge status: Skilled Nursing Facility | Payer: Self-pay | Source: Ambulatory Visit | Attending: Neurosurgery

## 2016-10-14 ENCOUNTER — Inpatient Hospital Stay (HOSPITAL_COMMUNITY): Payer: Medicare Other

## 2016-10-14 ENCOUNTER — Inpatient Hospital Stay (HOSPITAL_COMMUNITY): Payer: Medicare Other | Admitting: Vascular Surgery

## 2016-10-14 ENCOUNTER — Encounter (HOSPITAL_COMMUNITY): Payer: Self-pay | Admitting: General Practice

## 2016-10-14 ENCOUNTER — Inpatient Hospital Stay (HOSPITAL_COMMUNITY)
Admission: RE | Admit: 2016-10-14 | Discharge: 2016-10-18 | DRG: 455 | Disposition: A | Discharge status: Skilled Nursing Facility | Payer: Medicare Other | Source: Ambulatory Visit | Attending: Neurosurgery | Admitting: Neurosurgery

## 2016-10-14 DIAGNOSIS — I10 Essential (primary) hypertension: Secondary | ICD-10-CM | POA: Diagnosis not present

## 2016-10-14 DIAGNOSIS — M48061 Spinal stenosis, lumbar region without neurogenic claudication: Principal | ICD-10-CM | POA: Diagnosis present

## 2016-10-14 DIAGNOSIS — M19071 Primary osteoarthritis, right ankle and foot: Secondary | ICD-10-CM | POA: Diagnosis present

## 2016-10-14 DIAGNOSIS — K648 Other hemorrhoids: Secondary | ICD-10-CM | POA: Diagnosis present

## 2016-10-14 DIAGNOSIS — D509 Iron deficiency anemia, unspecified: Secondary | ICD-10-CM | POA: Diagnosis present

## 2016-10-14 DIAGNOSIS — Z981 Arthrodesis status: Secondary | ICD-10-CM | POA: Diagnosis not present

## 2016-10-14 DIAGNOSIS — M545 Low back pain: Secondary | ICD-10-CM | POA: Diagnosis present

## 2016-10-14 DIAGNOSIS — M5137 Other intervertebral disc degeneration, lumbosacral region: Secondary | ICD-10-CM | POA: Diagnosis present

## 2016-10-14 DIAGNOSIS — N189 Chronic kidney disease, unspecified: Secondary | ICD-10-CM | POA: Diagnosis present

## 2016-10-14 DIAGNOSIS — I2510 Atherosclerotic heart disease of native coronary artery without angina pectoris: Secondary | ICD-10-CM | POA: Diagnosis not present

## 2016-10-14 DIAGNOSIS — K2950 Unspecified chronic gastritis without bleeding: Secondary | ICD-10-CM | POA: Diagnosis not present

## 2016-10-14 DIAGNOSIS — M19072 Primary osteoarthritis, left ankle and foot: Secondary | ICD-10-CM | POA: Diagnosis present

## 2016-10-14 DIAGNOSIS — Z79891 Long term (current) use of opiate analgesic: Secondary | ICD-10-CM | POA: Diagnosis not present

## 2016-10-14 DIAGNOSIS — D649 Anemia, unspecified: Secondary | ICD-10-CM | POA: Diagnosis not present

## 2016-10-14 DIAGNOSIS — M48062 Spinal stenosis, lumbar region with neurogenic claudication: Secondary | ICD-10-CM

## 2016-10-14 DIAGNOSIS — I480 Paroxysmal atrial fibrillation: Secondary | ICD-10-CM | POA: Diagnosis present

## 2016-10-14 DIAGNOSIS — I6529 Occlusion and stenosis of unspecified carotid artery: Secondary | ICD-10-CM | POA: Diagnosis not present

## 2016-10-14 DIAGNOSIS — Z9842 Cataract extraction status, left eye: Secondary | ICD-10-CM

## 2016-10-14 DIAGNOSIS — M5136 Other intervertebral disc degeneration, lumbar region: Secondary | ICD-10-CM | POA: Diagnosis not present

## 2016-10-14 DIAGNOSIS — N401 Enlarged prostate with lower urinary tract symptoms: Secondary | ICD-10-CM | POA: Diagnosis present

## 2016-10-14 DIAGNOSIS — Z885 Allergy status to narcotic agent status: Secondary | ICD-10-CM

## 2016-10-14 DIAGNOSIS — H409 Unspecified glaucoma: Secondary | ICD-10-CM | POA: Diagnosis present

## 2016-10-14 DIAGNOSIS — Z79899 Other long term (current) drug therapy: Secondary | ICD-10-CM | POA: Diagnosis not present

## 2016-10-14 DIAGNOSIS — K589 Irritable bowel syndrome without diarrhea: Secondary | ICD-10-CM | POA: Diagnosis present

## 2016-10-14 DIAGNOSIS — E785 Hyperlipidemia, unspecified: Secondary | ICD-10-CM | POA: Diagnosis not present

## 2016-10-14 DIAGNOSIS — M5416 Radiculopathy, lumbar region: Secondary | ICD-10-CM

## 2016-10-14 DIAGNOSIS — Z87891 Personal history of nicotine dependence: Secondary | ICD-10-CM | POA: Diagnosis not present

## 2016-10-14 DIAGNOSIS — M5116 Intervertebral disc disorders with radiculopathy, lumbar region: Secondary | ICD-10-CM | POA: Diagnosis not present

## 2016-10-14 DIAGNOSIS — R0902 Hypoxemia: Secondary | ICD-10-CM

## 2016-10-14 DIAGNOSIS — Z961 Presence of intraocular lens: Secondary | ICD-10-CM | POA: Diagnosis present

## 2016-10-14 DIAGNOSIS — K209 Esophagitis, unspecified: Secondary | ICD-10-CM | POA: Diagnosis not present

## 2016-10-14 DIAGNOSIS — M5127 Other intervertebral disc displacement, lumbosacral region: Secondary | ICD-10-CM | POA: Diagnosis not present

## 2016-10-14 DIAGNOSIS — Z9841 Cataract extraction status, right eye: Secondary | ICD-10-CM

## 2016-10-14 DIAGNOSIS — M1990 Unspecified osteoarthritis, unspecified site: Secondary | ICD-10-CM | POA: Diagnosis not present

## 2016-10-14 DIAGNOSIS — I129 Hypertensive chronic kidney disease with stage 1 through stage 4 chronic kidney disease, or unspecified chronic kidney disease: Secondary | ICD-10-CM | POA: Diagnosis not present

## 2016-10-14 DIAGNOSIS — Z4789 Encounter for other orthopedic aftercare: Secondary | ICD-10-CM | POA: Diagnosis not present

## 2016-10-14 DIAGNOSIS — Z952 Presence of prosthetic heart valve: Secondary | ICD-10-CM

## 2016-10-14 DIAGNOSIS — K5790 Diverticulosis of intestine, part unspecified, without perforation or abscess without bleeding: Secondary | ICD-10-CM | POA: Diagnosis present

## 2016-10-14 DIAGNOSIS — R112 Nausea with vomiting, unspecified: Secondary | ICD-10-CM | POA: Diagnosis not present

## 2016-10-14 DIAGNOSIS — N403 Nodular prostate with lower urinary tract symptoms: Secondary | ICD-10-CM | POA: Diagnosis not present

## 2016-10-14 DIAGNOSIS — M069 Rheumatoid arthritis, unspecified: Secondary | ICD-10-CM | POA: Diagnosis not present

## 2016-10-14 DIAGNOSIS — Z9049 Acquired absence of other specified parts of digestive tract: Secondary | ICD-10-CM

## 2016-10-14 DIAGNOSIS — H401190 Primary open-angle glaucoma, unspecified eye, stage unspecified: Secondary | ICD-10-CM | POA: Diagnosis not present

## 2016-10-14 DIAGNOSIS — M5030 Other cervical disc degeneration, unspecified cervical region: Secondary | ICD-10-CM | POA: Diagnosis not present

## 2016-10-14 DIAGNOSIS — F329 Major depressive disorder, single episode, unspecified: Secondary | ICD-10-CM | POA: Diagnosis not present

## 2016-10-14 DIAGNOSIS — Z419 Encounter for procedure for purposes other than remedying health state, unspecified: Secondary | ICD-10-CM

## 2016-10-14 DIAGNOSIS — Z825 Family history of asthma and other chronic lower respiratory diseases: Secondary | ICD-10-CM

## 2016-10-14 DIAGNOSIS — K219 Gastro-esophageal reflux disease without esophagitis: Secondary | ICD-10-CM | POA: Diagnosis not present

## 2016-10-14 DIAGNOSIS — N183 Chronic kidney disease, stage 3 (moderate): Secondary | ICD-10-CM | POA: Diagnosis present

## 2016-10-14 DIAGNOSIS — Z96652 Presence of left artificial knee joint: Secondary | ICD-10-CM | POA: Diagnosis not present

## 2016-10-14 DIAGNOSIS — I6523 Occlusion and stenosis of bilateral carotid arteries: Secondary | ICD-10-CM | POA: Diagnosis not present

## 2016-10-14 DIAGNOSIS — M4326 Fusion of spine, lumbar region: Secondary | ICD-10-CM | POA: Diagnosis not present

## 2016-10-14 DIAGNOSIS — Z7982 Long term (current) use of aspirin: Secondary | ICD-10-CM | POA: Diagnosis not present

## 2016-10-14 DIAGNOSIS — R338 Other retention of urine: Secondary | ICD-10-CM | POA: Diagnosis not present

## 2016-10-14 DIAGNOSIS — J9811 Atelectasis: Secondary | ICD-10-CM | POA: Diagnosis not present

## 2016-10-14 DIAGNOSIS — Z880 Allergy status to penicillin: Secondary | ICD-10-CM

## 2016-10-14 DIAGNOSIS — M4807 Spinal stenosis, lumbosacral region: Secondary | ICD-10-CM | POA: Diagnosis not present

## 2016-10-14 DIAGNOSIS — Z8249 Family history of ischemic heart disease and other diseases of the circulatory system: Secondary | ICD-10-CM

## 2016-10-14 HISTORY — DX: Rheumatoid arthritis, unspecified: M06.9

## 2016-10-14 HISTORY — DX: Low back pain, unspecified: M54.50

## 2016-10-14 HISTORY — DX: Unspecified osteoarthritis, unspecified site: M19.90

## 2016-10-14 HISTORY — DX: Gastro-esophageal reflux disease without esophagitis: K21.9

## 2016-10-14 HISTORY — DX: Abnormal findings on diagnostic imaging of other specified body structures: R93.89

## 2016-10-14 HISTORY — DX: Anemia, unspecified: D64.9

## 2016-10-14 HISTORY — PX: POSTERIOR LUMBAR FUSION: SHX6036

## 2016-10-14 HISTORY — DX: Low back pain: M54.5

## 2016-10-14 HISTORY — DX: Other chronic pain: G89.29

## 2016-10-14 LAB — BASIC METABOLIC PANEL
Anion gap: 8 (ref 5–15)
BUN: 30 mg/dL — ABNORMAL HIGH (ref 6–20)
CO2: 22 mmol/L (ref 22–32)
Calcium: 8.6 mg/dL — ABNORMAL LOW (ref 8.9–10.3)
Chloride: 104 mmol/L (ref 101–111)
Creatinine, Ser: 1.22 mg/dL (ref 0.61–1.24)
GFR calc Af Amer: >60 mL/min (ref >60)
GFR calc non Af Amer: 55 mL/min — ABNORMAL LOW (ref >60)
Glucose, Bld: 128 mg/dL — ABNORMAL HIGH (ref 65–99)
Potassium: 5.3 mmol/L — ABNORMAL HIGH (ref 3.5–5.1)
Sodium: 134 mmol/L — ABNORMAL LOW (ref 135–145)

## 2016-10-14 LAB — PREPARE RBC (CROSSMATCH)

## 2016-10-14 SURGERY — POSTERIOR LUMBAR FUSION 1 LEVEL
Anesthesia: General | Site: Back

## 2016-10-14 MED ORDER — LORATADINE 10 MG PO TABS
10.0000 mg | ORAL_TABLET | Freq: Every day | ORAL | Status: DC
  Administered 2016-10-16 – 2016-10-18 (×3): 10 mg via ORAL
  Filled 2016-10-14 (×3): qty 1

## 2016-10-14 MED ORDER — ONDANSETRON HCL 4 MG PO TABS: 4.0000 mg | ORAL_TABLET | Freq: Four times a day (QID) | ORAL | Status: DC | PRN

## 2016-10-14 MED ORDER — PROPOFOL 10 MG/ML IV BOLUS
INTRAVENOUS | Status: AC
  Filled 2016-10-14: qty 20

## 2016-10-14 MED ORDER — VANCOMYCIN HCL IN DEXTROSE 1-5 GM/200ML-% IV SOLN
1000.0000 mg | INTRAVENOUS | Status: AC
  Administered 2016-10-14: 1000 mg via INTRAVENOUS

## 2016-10-14 MED ORDER — ASPIRIN EC 81 MG PO TBEC
81.0000 mg | DELAYED_RELEASE_TABLET | Freq: Every day | ORAL | Status: DC
  Administered 2016-10-14 – 2016-10-18 (×4): 81 mg via ORAL
  Filled 2016-10-14 (×4): qty 1

## 2016-10-14 MED ORDER — ROCURONIUM BROMIDE 100 MG/10ML IV SOLN
INTRAVENOUS | Status: DC | PRN
  Administered 2016-10-14: 50 mg via INTRAVENOUS
  Administered 2016-10-14: 20 mg via INTRAVENOUS

## 2016-10-14 MED ORDER — FLUOXETINE HCL 20 MG PO CAPS
20.0000 mg | ORAL_CAPSULE | Freq: Every day | ORAL | Status: DC
  Administered 2016-10-16 – 2016-10-18 (×3): 20 mg via ORAL
  Filled 2016-10-14 (×3): qty 1

## 2016-10-14 MED ORDER — AMOXICILLIN 500 MG PO CAPS: 2000.0000 mg | ORAL_CAPSULE | ORAL | Status: DC | PRN

## 2016-10-14 MED ORDER — GABAPENTIN 400 MG PO CAPS
800.0000 mg | ORAL_CAPSULE | Freq: Three times a day (TID) | ORAL | Status: DC
  Administered 2016-10-14 (×2): 800 mg via ORAL
  Filled 2016-10-14 (×2): qty 2

## 2016-10-14 MED ORDER — VITAMIN C 500 MG PO TABS
500.0000 mg | ORAL_TABLET | Freq: Every day | ORAL | Status: DC
  Administered 2016-10-16 – 2016-10-18 (×3): 500 mg via ORAL
  Filled 2016-10-14 (×3): qty 1

## 2016-10-14 MED ORDER — LIDOCAINE-EPINEPHRINE 1 %-1:100000 IJ SOLN
INTRAMUSCULAR | Status: DC | PRN
  Administered 2016-10-14: 9 mL

## 2016-10-14 MED ORDER — DEXAMETHASONE SODIUM PHOSPHATE 10 MG/ML IJ SOLN
INTRAMUSCULAR | Status: AC
  Filled 2016-10-14: qty 1

## 2016-10-14 MED ORDER — VANCOMYCIN HCL 1000 MG IV SOLR
INTRAVENOUS | Status: DC | PRN
  Administered 2016-10-14: 1000 mg via TOPICAL

## 2016-10-14 MED ORDER — OXYCODONE HCL 5 MG PO TABS
ORAL_TABLET | ORAL | Status: AC
  Administered 2016-10-14: 10 mg via ORAL
  Filled 2016-10-14: qty 2

## 2016-10-14 MED ORDER — FENOFIBRATE 160 MG PO TABS
160.0000 mg | ORAL_TABLET | Freq: Every day | ORAL | Status: DC
  Administered 2016-10-14 – 2016-10-18 (×4): 160 mg via ORAL
  Filled 2016-10-14 (×4): qty 1

## 2016-10-14 MED ORDER — BUPIVACAINE LIPOSOME 1.3 % IJ SUSP
20.0000 mL | INTRAMUSCULAR | Status: DC
  Filled 2016-10-14: qty 20

## 2016-10-14 MED ORDER — MAGNESIUM OXIDE 400 (241.3 MG) MG PO TABS
400.0000 mg | ORAL_TABLET | Freq: Every day | ORAL | Status: DC
  Administered 2016-10-14 – 2016-10-18 (×4): 400 mg via ORAL
  Filled 2016-10-14 (×4): qty 1

## 2016-10-14 MED ORDER — ACETAMINOPHEN 325 MG PO TABS
650.0000 mg | ORAL_TABLET | ORAL | Status: DC | PRN
  Administered 2016-10-15: 650 mg via ORAL
  Filled 2016-10-14 (×2): qty 2

## 2016-10-14 MED ORDER — CYCLOBENZAPRINE HCL 10 MG PO TABS
ORAL_TABLET | ORAL | Status: AC
  Administered 2016-10-14: 10 mg via ORAL
  Filled 2016-10-14: qty 1

## 2016-10-14 MED ORDER — ONDANSETRON HCL 4 MG/2ML IJ SOLN
INTRAMUSCULAR | Status: DC | PRN
  Administered 2016-10-14: 4 mg via INTRAVENOUS

## 2016-10-14 MED ORDER — CHLORHEXIDINE GLUCONATE CLOTH 2 % EX PADS: 6.0000 | MEDICATED_PAD | Freq: Once | CUTANEOUS | Status: DC

## 2016-10-14 MED ORDER — HYDROCODONE-ACETAMINOPHEN 5-325 MG PO TABS
2.0000 | ORAL_TABLET | ORAL | Status: DC | PRN
  Administered 2016-10-15: 2 via ORAL
  Filled 2016-10-14: qty 2

## 2016-10-14 MED ORDER — BUPIVACAINE LIPOSOME 1.3 % IJ SUSP
INTRAMUSCULAR | Status: DC | PRN
  Administered 2016-10-14: 20 mL

## 2016-10-14 MED ORDER — SODIUM CHLORIDE 0.9 % IR SOLN
Status: DC | PRN
  Administered 2016-10-14: 09:00:00

## 2016-10-14 MED ORDER — LACTATED RINGERS IV SOLN
INTRAVENOUS | Status: DC | PRN
  Administered 2016-10-14 (×3): via INTRAVENOUS

## 2016-10-14 MED ORDER — HYDROMORPHONE HCL 1 MG/ML IJ SOLN
0.5000 mg | INTRAMUSCULAR | Status: DC | PRN
Start: 2016-10-14 — End: 2016-10-15

## 2016-10-14 MED ORDER — FENTANYL CITRATE (PF) 100 MCG/2ML IJ SOLN
INTRAMUSCULAR | Status: DC | PRN
Start: 2016-10-14 — End: 2016-10-14
  Administered 2016-10-14: 150 ug via INTRAVENOUS
  Administered 2016-10-14: 50 ug via INTRAVENOUS

## 2016-10-14 MED ORDER — MENTHOL 3 MG MT LOZG: 1.0000 | LOZENGE | OROMUCOSAL | Status: DC | PRN

## 2016-10-14 MED ORDER — MIDAZOLAM HCL 2 MG/2ML IJ SOLN
5.0000 mg | Freq: Once | INTRAMUSCULAR | Status: DC
  Filled 2016-10-14: qty 6

## 2016-10-14 MED ORDER — PROPOFOL 10 MG/ML IV BOLUS
INTRAVENOUS | Status: DC | PRN
  Administered 2016-10-14: 100 mg via INTRAVENOUS

## 2016-10-14 MED ORDER — THROMBIN 5000 UNITS EX SOLR
CUTANEOUS | Status: AC
  Filled 2016-10-14: qty 5000

## 2016-10-14 MED ORDER — LISINOPRIL 20 MG PO TABS
20.0000 mg | ORAL_TABLET | Freq: Every evening | ORAL | Status: DC
  Administered 2016-10-14: 20 mg via ORAL
  Filled 2016-10-14: qty 1

## 2016-10-14 MED ORDER — SUGAMMADEX SODIUM 200 MG/2ML IV SOLN
INTRAVENOUS | Status: DC | PRN
  Administered 2016-10-14: 200 mg via INTRAVENOUS

## 2016-10-14 MED ORDER — THROMBIN 20000 UNITS EX SOLR
CUTANEOUS | Status: DC | PRN
  Administered 2016-10-14: 09:00:00 via TOPICAL

## 2016-10-14 MED ORDER — VANCOMYCIN HCL IN DEXTROSE 1-5 GM/200ML-% IV SOLN
INTRAVENOUS | Status: AC
  Filled 2016-10-14: qty 200

## 2016-10-14 MED ORDER — ARTIFICIAL TEARS OPHTHALMIC OINT
TOPICAL_OINTMENT | OPHTHALMIC | Status: DC | PRN
  Administered 2016-10-14: 1 via OPHTHALMIC

## 2016-10-14 MED ORDER — GELATIN ABSORBABLE MT POWD
OROMUCOSAL | Status: DC | PRN
  Administered 2016-10-14: 09:00:00 via TOPICAL

## 2016-10-14 MED ORDER — BRIMONIDINE TARTRATE 0.2 % OP SOLN
1.0000 [drp] | Freq: Every morning | OPHTHALMIC | Status: DC
  Administered 2016-10-16 – 2016-10-18 (×3): 1 [drp] via OPHTHALMIC
  Filled 2016-10-14: qty 5

## 2016-10-14 MED ORDER — SODIUM CHLORIDE 0.9% FLUSH: 3.0000 mL | INTRAVENOUS | Status: DC | PRN

## 2016-10-14 MED ORDER — B COMPLEX-C PO TABS
1.0000 | ORAL_TABLET | Freq: Every day | ORAL | Status: DC
  Administered 2016-10-16 – 2016-10-18 (×3): 1 via ORAL
  Filled 2016-10-14 (×4): qty 1

## 2016-10-14 MED ORDER — GABAPENTIN 800 MG PO TABS
800.0000 mg | ORAL_TABLET | Freq: Three times a day (TID) | ORAL | Status: DC
  Filled 2016-10-14 (×2): qty 1

## 2016-10-14 MED ORDER — PHENYLEPHRINE HCL 10 MG/ML IJ SOLN
INTRAVENOUS | Status: DC | PRN
  Administered 2016-10-14: 25 ug/min via INTRAVENOUS

## 2016-10-14 MED ORDER — LIDOCAINE HCL (CARDIAC) 20 MG/ML IV SOLN
INTRAVENOUS | Status: DC | PRN
  Administered 2016-10-14: 100 mg via INTRAVENOUS

## 2016-10-14 MED ORDER — ACETAMINOPHEN 650 MG RE SUPP: 650.0000 mg | RECTAL | Status: DC | PRN

## 2016-10-14 MED ORDER — TRAZODONE HCL 100 MG PO TABS: 100.0000 mg | ORAL_TABLET | Freq: Every evening | ORAL | Status: DC | PRN

## 2016-10-14 MED ORDER — SODIUM CHLORIDE 0.9 % IV SOLN
250.0000 mL | INTRAVENOUS | Status: DC
  Administered 2016-10-14: 250 mL via INTRAVENOUS

## 2016-10-14 MED ORDER — CHOLECALCIFEROL 10 MCG (400 UNIT) PO TABS
400.0000 [IU] | ORAL_TABLET | Freq: Every day | ORAL | Status: DC
  Administered 2016-10-16 – 2016-10-18 (×3): 400 [IU] via ORAL
  Filled 2016-10-14 (×4): qty 1

## 2016-10-14 MED ORDER — ALUM & MAG HYDROXIDE-SIMETH 200-200-20 MG/5ML PO SUSP: 30.0000 mL | Freq: Four times a day (QID) | ORAL | Status: DC | PRN

## 2016-10-14 MED ORDER — FERROUS SULFATE 325 (65 FE) MG PO TABS
325.0000 mg | ORAL_TABLET | Freq: Every day | ORAL | Status: DC
  Administered 2016-10-18: 325 mg via ORAL
  Filled 2016-10-14 (×2): qty 1

## 2016-10-14 MED ORDER — FLUCONAZOLE 100 MG PO TABS
200.0000 mg | ORAL_TABLET | Freq: Every day | ORAL | Status: DC
  Administered 2016-10-14 – 2016-10-16 (×2): 200 mg via ORAL
  Filled 2016-10-14 (×2): qty 2

## 2016-10-14 MED ORDER — FENTANYL CITRATE (PF) 100 MCG/2ML IJ SOLN: 100.0000 ug | Freq: Once | INTRAMUSCULAR | Status: DC

## 2016-10-14 MED ORDER — SODIUM CHLORIDE 0.9% FLUSH
3.0000 mL | Freq: Two times a day (BID) | INTRAVENOUS | Status: DC
  Administered 2016-10-14 – 2016-10-18 (×6): 3 mL via INTRAVENOUS

## 2016-10-14 MED ORDER — FENTANYL 25 MCG/HR TD PT72
25.0000 ug | MEDICATED_PATCH | TRANSDERMAL | Status: DC
  Administered 2016-10-17: 25 ug via TRANSDERMAL
  Filled 2016-10-14: qty 1

## 2016-10-14 MED ORDER — PANTOPRAZOLE SODIUM 40 MG PO TBEC: 40.0000 mg | DELAYED_RELEASE_TABLET | Freq: Every day | ORAL | Status: DC

## 2016-10-14 MED ORDER — OXYCODONE HCL 5 MG PO TABS
5.0000 mg | ORAL_TABLET | ORAL | Status: DC | PRN
  Administered 2016-10-14: 10 mg via ORAL

## 2016-10-14 MED ORDER — VANCOMYCIN HCL IN DEXTROSE 750-5 MG/150ML-% IV SOLN
750.0000 mg | Freq: Two times a day (BID) | INTRAVENOUS | Status: DC
  Administered 2016-10-14 – 2016-10-17 (×6): 750 mg via INTRAVENOUS
  Filled 2016-10-14 (×7): qty 150

## 2016-10-14 MED ORDER — ALBUMIN HUMAN 5 % IV SOLN
INTRAVENOUS | Status: DC | PRN
  Administered 2016-10-14: 08:00:00 via INTRAVENOUS

## 2016-10-14 MED ORDER — ONDANSETRON HCL 4 MG/2ML IJ SOLN
4.0000 mg | Freq: Four times a day (QID) | INTRAMUSCULAR | Status: DC | PRN
  Administered 2016-10-15: 4 mg via INTRAVENOUS
  Filled 2016-10-14: qty 2

## 2016-10-14 MED ORDER — EPHEDRINE SULFATE 50 MG/ML IJ SOLN
INTRAMUSCULAR | Status: DC | PRN
  Administered 2016-10-14 (×2): 10 mg via INTRAVENOUS
  Administered 2016-10-14: 5 mg via INTRAVENOUS

## 2016-10-14 MED ORDER — BUPIVACAINE HCL (PF) 0.25 % IJ SOLN
INTRAMUSCULAR | Status: AC
  Filled 2016-10-14: qty 30

## 2016-10-14 MED ORDER — ENSURE ENLIVE PO LIQD: 237.0000 mL | Freq: Two times a day (BID) | ORAL | Status: DC

## 2016-10-14 MED ORDER — PHENOL 1.4 % MT LIQD
1.0000 | OROMUCOSAL | Status: DC | PRN
  Filled 2016-10-14: qty 177

## 2016-10-14 MED ORDER — GLYCOPYRROLATE 0.2 MG/ML IJ SOLN
INTRAMUSCULAR | Status: DC | PRN
  Administered 2016-10-14: 0.2 mg via INTRAVENOUS

## 2016-10-14 MED ORDER — PREDNISONE 5 MG PO TABS: 5.0000 mg | ORAL_TABLET | Freq: Every day | ORAL | Status: DC

## 2016-10-14 MED ORDER — CYCLOBENZAPRINE HCL 10 MG PO TABS
10.0000 mg | ORAL_TABLET | Freq: Three times a day (TID) | ORAL | Status: DC | PRN
  Administered 2016-10-14: 10 mg via ORAL

## 2016-10-14 MED ORDER — TAMSULOSIN HCL 0.4 MG PO CAPS
0.4000 mg | ORAL_CAPSULE | Freq: Every day | ORAL | Status: DC
  Administered 2016-10-14 – 2016-10-18 (×4): 0.4 mg via ORAL
  Filled 2016-10-14 (×4): qty 1

## 2016-10-14 MED ORDER — MEPERIDINE HCL 25 MG/ML IJ SOLN: 6.2500 mg | INTRAMUSCULAR | Status: DC | PRN

## 2016-10-14 MED ORDER — VANCOMYCIN HCL 1000 MG IV SOLR
INTRAVENOUS | Status: AC
  Filled 2016-10-14: qty 1000

## 2016-10-14 MED ORDER — AMLODIPINE BESYLATE 5 MG PO TABS
5.0000 mg | ORAL_TABLET | Freq: Every day | ORAL | Status: DC
  Administered 2016-10-16 – 2016-10-18 (×3): 5 mg via ORAL
  Filled 2016-10-14 (×3): qty 1

## 2016-10-14 MED ORDER — DEXAMETHASONE SODIUM PHOSPHATE 10 MG/ML IJ SOLN: 10.0000 mg | INTRAMUSCULAR | Status: DC

## 2016-10-14 MED ORDER — LACTATED RINGERS IV SOLN: 1000.0000 mL | INTRAVENOUS | Status: DC

## 2016-10-14 MED ORDER — B COMPLEX PO TABS
1.0000 | ORAL_TABLET | Freq: Every day | ORAL | Status: DC
  Administered 2016-10-14: 1 via ORAL

## 2016-10-14 MED ORDER — THROMBIN 20000 UNITS EX SOLR
CUTANEOUS | Status: AC
  Filled 2016-10-14: qty 20000

## 2016-10-14 MED ORDER — ACETAMINOPHEN 10 MG/ML IV SOLN
INTRAVENOUS | Status: AC
  Filled 2016-10-14: qty 100

## 2016-10-14 MED ORDER — FENTANYL CITRATE (PF) 250 MCG/5ML IJ SOLN
INTRAMUSCULAR | Status: AC
  Filled 2016-10-14: qty 5

## 2016-10-14 MED ORDER — SIMVASTATIN 40 MG PO TABS
40.0000 mg | ORAL_TABLET | Freq: Every day | ORAL | Status: DC
  Administered 2016-10-14 – 2016-10-15 (×2): 40 mg via ORAL
  Filled 2016-10-14 (×2): qty 1

## 2016-10-14 MED ORDER — PHENYLEPHRINE HCL 10 MG/ML IJ SOLN
INTRAMUSCULAR | Status: DC | PRN
  Administered 2016-10-14: 80 ug via INTRAVENOUS

## 2016-10-14 MED ORDER — VITAMIN B-12 1000 MCG PO TABS
1000.0000 ug | ORAL_TABLET | Freq: Every day | ORAL | Status: DC
  Administered 2016-10-16 – 2016-10-18 (×3): 1000 ug via ORAL
  Filled 2016-10-14 (×3): qty 1

## 2016-10-14 MED ORDER — PANTOPRAZOLE SODIUM 40 MG IV SOLR
40.0000 mg | Freq: Every day | INTRAVENOUS | Status: DC
  Administered 2016-10-14 – 2016-10-17 (×4): 40 mg via INTRAVENOUS
  Filled 2016-10-14 (×4): qty 40

## 2016-10-14 MED ORDER — ACETAMINOPHEN 10 MG/ML IV SOLN
INTRAVENOUS | Status: DC | PRN
  Administered 2016-10-14: 1000 mg via INTRAVENOUS

## 2016-10-14 MED ORDER — FOLIC ACID 1 MG PO TABS
1.0000 mg | ORAL_TABLET | Freq: Two times a day (BID) | ORAL | Status: DC
Start: 2016-10-14 — End: 2016-10-18
  Administered 2016-10-14 – 2016-10-18 (×7): 1 mg via ORAL
  Filled 2016-10-14 (×7): qty 1

## 2016-10-14 MED ORDER — ONDANSETRON HCL 4 MG/2ML IJ SOLN: 4.0000 mg | Freq: Once | INTRAMUSCULAR | Status: DC | PRN

## 2016-10-14 MED ORDER — HYDRALAZINE HCL 25 MG PO TABS
25.0000 mg | ORAL_TABLET | Freq: Three times a day (TID) | ORAL | Status: DC
  Administered 2016-10-14 – 2016-10-18 (×11): 25 mg via ORAL
  Filled 2016-10-14 (×11): qty 1

## 2016-10-14 MED ORDER — 0.9 % SODIUM CHLORIDE (POUR BTL) OPTIME
TOPICAL | Status: DC | PRN
  Administered 2016-10-14: 1000 mL

## 2016-10-14 MED ORDER — LIDOCAINE-EPINEPHRINE 1 %-1:100000 IJ SOLN
INTRAMUSCULAR | Status: AC
  Filled 2016-10-14: qty 1

## 2016-10-14 MED FILL — Heparin Sodium (Porcine) Inj 1000 Unit/ML: INTRAMUSCULAR | Qty: 30 | Status: AC

## 2016-10-14 MED FILL — Sodium Chloride IV Soln 0.9%: INTRAVENOUS | Qty: 1000 | Status: AC

## 2016-10-14 SURGICAL SUPPLY — 77 items
BAG DECANTER FOR FLEXI CONT (MISCELLANEOUS) ×2 IMPLANT
BENZOIN TINCTURE PRP APPL 2/3 (GAUZE/BANDAGES/DRESSINGS) ×2 IMPLANT
BLADE BN FN 3.2XSTRL LF (MISCELLANEOUS) ×1 IMPLANT
BLADE BONE MILL FINE (MISCELLANEOUS) ×1
BLADE CLIPPER SURG (BLADE) IMPLANT
BLADE SURG 11 STRL SS (BLADE) ×2 IMPLANT
BONE VIVIGEN FORMABLE 5.4CC (Bone Implant) ×2 IMPLANT
BUR CUTTER 7.0 ROUND (BURR) ×2 IMPLANT
BUR MATCHSTICK NEURO 3.0 LAGG (BURR) ×2 IMPLANT
CANISTER SUCT 3000ML PPV (MISCELLANEOUS) ×2 IMPLANT
CAP LOCKING THREADED (Cap) ×8 IMPLANT
CARTRIDGE OIL MAESTRO DRILL (MISCELLANEOUS) ×1 IMPLANT
CONT SPEC 4OZ CLIKSEAL STRL BL (MISCELLANEOUS) ×6 IMPLANT
COVER BACK TABLE 60X90IN (DRAPES) ×2 IMPLANT
DECANTER SPIKE VIAL GLASS SM (MISCELLANEOUS) ×2 IMPLANT
DERMABOND ADVANCED (GAUZE/BANDAGES/DRESSINGS) ×1
DERMABOND ADVANCED .7 DNX12 (GAUZE/BANDAGES/DRESSINGS) ×1 IMPLANT
DIFFUSER DRILL AIR PNEUMATIC (MISCELLANEOUS) ×2 IMPLANT
DRAPE C-ARM 42X72 X-RAY (DRAPES) ×2 IMPLANT
DRAPE C-ARMOR (DRAPES) ×2 IMPLANT
DRAPE HALF SHEET 40X57 (DRAPES) IMPLANT
DRAPE LAPAROTOMY 100X72X124 (DRAPES) ×2 IMPLANT
DRAPE POUCH INSTRU U-SHP 10X18 (DRAPES) ×2 IMPLANT
DRAPE SURG 17X23 STRL (DRAPES) ×2 IMPLANT
DRSG OPSITE 4X5.5 SM (GAUZE/BANDAGES/DRESSINGS) ×2 IMPLANT
DRSG OPSITE POSTOP 4X8 (GAUZE/BANDAGES/DRESSINGS) ×2 IMPLANT
DURAPREP 26ML APPLICATOR (WOUND CARE) ×2 IMPLANT
ELECT BLADE 4.0 EZ CLEAN MEGAD (MISCELLANEOUS) ×2
ELECT REM PT RETURN 9FT ADLT (ELECTROSURGICAL) ×2
ELECTRODE BLDE 4.0 EZ CLN MEGD (MISCELLANEOUS) ×1 IMPLANT
ELECTRODE REM PT RTRN 9FT ADLT (ELECTROSURGICAL) ×1 IMPLANT
EVACUATOR 3/16  PVC DRAIN (DRAIN) ×1
EVACUATOR 3/16 PVC DRAIN (DRAIN) ×1 IMPLANT
GAUZE SPONGE 4X4 12PLY STRL (GAUZE/BANDAGES/DRESSINGS) ×2 IMPLANT
GAUZE SPONGE 4X4 16PLY XRAY LF (GAUZE/BANDAGES/DRESSINGS) IMPLANT
GLOVE BIO SURGEON STRL SZ7 (GLOVE) IMPLANT
GLOVE BIO SURGEON STRL SZ8 (GLOVE) ×4 IMPLANT
GLOVE BIOGEL PI IND STRL 7.0 (GLOVE) IMPLANT
GLOVE BIOGEL PI INDICATOR 7.0 (GLOVE)
GLOVE ECLIPSE 7.5 STRL STRAW (GLOVE) IMPLANT
GLOVE EXAM NITRILE LRG STRL (GLOVE) IMPLANT
GLOVE EXAM NITRILE XL STR (GLOVE) IMPLANT
GLOVE EXAM NITRILE XS STR PU (GLOVE) IMPLANT
GLOVE INDICATOR 8.5 STRL (GLOVE) ×4 IMPLANT
GOWN STRL REUS W/ TWL LRG LVL3 (GOWN DISPOSABLE) IMPLANT
GOWN STRL REUS W/ TWL XL LVL3 (GOWN DISPOSABLE) ×2 IMPLANT
GOWN STRL REUS W/TWL 2XL LVL3 (GOWN DISPOSABLE) IMPLANT
GOWN STRL REUS W/TWL LRG LVL3 (GOWN DISPOSABLE)
GOWN STRL REUS W/TWL XL LVL3 (GOWN DISPOSABLE) ×2
HEMOSTAT POWDER KIT SURGIFOAM (HEMOSTASIS) ×2 IMPLANT
KIT BASIN OR (CUSTOM PROCEDURE TRAY) ×2 IMPLANT
KIT INFUSE XX SMALL 0.7CC (Orthopedic Implant) ×2 IMPLANT
KIT ROOM TURNOVER OR (KITS) ×2 IMPLANT
NEEDLE HYPO 21X1.5 SAFETY (NEEDLE) ×2 IMPLANT
NEEDLE HYPO 25X1 1.5 SAFETY (NEEDLE) ×2 IMPLANT
NS IRRIG 1000ML POUR BTL (IV SOLUTION) ×4 IMPLANT
OIL CARTRIDGE MAESTRO DRILL (MISCELLANEOUS) ×2
PACK LAMINECTOMY NEURO (CUSTOM PROCEDURE TRAY) ×2 IMPLANT
PAD ARMBOARD 7.5X6 YLW CONV (MISCELLANEOUS) ×6 IMPLANT
PATTIES SURGICAL 1X1 (DISPOSABLE) ×2 IMPLANT
ROD CREO 45MM SPINAL (Rod) ×4 IMPLANT
SCREW AMP MODULAR CREO 6.5X45 (Screw) ×4 IMPLANT
SCREW CREO 7.5X40MM (Screw) ×4 IMPLANT
SCREW PA THRD CREO TULIP 5.5X4 (Head) ×8 IMPLANT
SPACER TPS 10X26MM 10MM (Spacer) ×4 IMPLANT
SPONGE LAP 4X18 X RAY DECT (DISPOSABLE) IMPLANT
SPONGE SURGIFOAM ABS GEL 100 (HEMOSTASIS) ×2 IMPLANT
STRIP CLOSURE SKIN 1/2X4 (GAUZE/BANDAGES/DRESSINGS) ×4 IMPLANT
SUT VIC AB 0 CT1 18XCR BRD8 (SUTURE) ×2 IMPLANT
SUT VIC AB 0 CT1 8-18 (SUTURE) ×2
SUT VIC AB 2-0 CT1 18 (SUTURE) ×2 IMPLANT
SUT VIC AB 4-0 PS2 27 (SUTURE) ×4 IMPLANT
SYRINGE 20CC LL (MISCELLANEOUS) ×2 IMPLANT
TOWEL GREEN STERILE (TOWEL DISPOSABLE) ×2 IMPLANT
TOWEL GREEN STERILE FF (TOWEL DISPOSABLE) ×2 IMPLANT
TRAY FOLEY W/METER SILVER 16FR (SET/KITS/TRAYS/PACK) ×2 IMPLANT
WATER STERILE IRR 1000ML POUR (IV SOLUTION) ×2 IMPLANT

## 2016-10-14 NOTE — Progress Notes (Signed)
Report to M. Talamayan RN as primary caregiver.

## 2016-10-14 NOTE — Anesthesia Procedure Notes (Signed)
Procedure Name: Intubation Date/Time: 10/14/2016 7:34 AM Performed by: Gaylene Brooks Pre-anesthesia Checklist: Patient identified, Emergency Drugs available, Suction available and Patient being monitored Patient Re-evaluated:Patient Re-evaluated prior to inductionOxygen Delivery Method: Circle System Utilized Preoxygenation: Pre-oxygenation with 100% oxygen Intubation Type: IV induction Ventilation: Mask ventilation without difficulty Laryngoscope Size: Miller and 2 Grade View: Grade I Tube type: Oral Tube size: 7.5 mm Number of attempts: 1 Airway Equipment and Method: Stylet,  Oral airway and Tracheostomy Placement Confirmation: ETT inserted through vocal cords under direct vision,  positive ETCO2 and breath sounds checked- equal and bilateral Secured at: 22 cm Tube secured with: Tape Dental Injury: Teeth and Oropharynx as per pre-operative assessment  Difficulty Due To: Difficult Airway-  due to edematous airway

## 2016-10-14 NOTE — H&P (Signed)
Bryan [name] is an 76 y.o. male.   Chief Complaint: Back and right leg pain HPI: 76 year old gentleman long-standing back and predominately right leg pain and undergone previous laminotomy initially did very well however last weeks and months is a progress worsening back and right leg pain rating down L5 and S1 nerve root pattern. Workup has revealed severe Gen. 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 Due to patient progressive clinical syndrome failed conservative treatment imaging findings have recommended decompression stabilization procedure at L5-S1. I've extensively gone over the risks and benefits of the operation with the patient as well as perioperative course expectations of outcome alternatives of surgery and he understands and agrees to proceed forward.  Past Medical History:  Diagnosis Date  . Allergy   . Arrhythmia   . 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 Chronic back pain   . Complication of anesthesia    Memory loss 09/2015  . Coronary artery disease   . Glaucoma   . H/O thoracic aortic aneurysm repair   . History of being hospitalized    memory lose kidney funtion down blood pressure up  . History of blood transfusion   . History of chicken pox   . Hyperlipidemia   . Hypertension   . Lumbar stenosis   . Paroxysmal A-fib ([place])   . Valvular heart disease     Past Surgical History:  Procedure Laterality Date  . AORTIC VALVE REPLACEMENT  2007   [facility].  [place]  . APPENDECTOMY  2010  . CATARACT EXTRACTION W/ INTRAOCULAR LENS  IMPLANT, BILATERAL    . CHOLECYSTECTOMY  2010  . COLONOSCOPY WITH PROPOFOL N/A 09/24/2016   Procedure: COLONOSCOPY WITH PROPOFOL;  Surgeon: Jonathon Bellows, MD;  Location[facility] ENDOSCOPY;  Service: Endoscopy;  Laterality: N/A;  . ESOPHAGOGASTRODUODENOSCOPY (EGD) WITH PROPOFOL N/A 09/24/2016   Procedure: ESOPHAGOGASTRODUODENOSCOPY (EGD) WITH PROPOFOL;  Surgeon: Jonathon Bellows, MD;  Location: [facility]  ENDOSCOPY;  Service: Endoscopy;  Laterality: N/A;  . EYE SURGERY    . GIVENS CAPSULE STUDY N/A 09/24/2016   Procedure: GIVENS CAPSULE STUDY;  Surgeon: Jonathon Bellows, MD;  Location: [facility] ENDOSCOPY;  Service: Endoscopy;  Laterality: N/A;  . HAMMER TOE SURGERY Right 10/09/2015   Procedure: HAMMER TOE REPAIR WITH K-WIRE FIXATION RIGHT SECOND TOE;  Surgeon: Albertine Patricia, DPM;  Location: [place];  Service: Podiatry;  Laterality: Right;  WITH LOCAL  . LUMBAR LAMINECTOMY/DECOMPRESSION MICRODISCECTOMY Right 03/08/2016   Procedure: Laminectomy and Foraminotomy - Lumbar Five-Sacral One Right;  Surgeon: Kary Kos, MD;  Location: [place];  Service: Neurosurgery;  Laterality: Right;  Right  . MULTIPLE TOOTH EXTRACTIONS    . REPLACEMENT TOTAL KNEE Left 2003  . THORACIC AORTIC ANEURYSM REPAIR  2010    Family History  Problem Relation Age of Onset  . Heart failure Mother   . Hypertension Mother   . Asthma Mother   . Heart attack Father 76       MI  . Hypertension Sister    Social History:  reports that he quit smoking about 35 years ago. His smoking use included Cigarettes. He has a 32.00 pack-year smoking history. He has never used smokeless tobacco. He reports that he drinks about 0.6 oz of alcohol per week . He reports that he does not use drugs.  Allergies:  Allergies  Allergen Reactions  . Penicillins Hives and Swelling    SWELLING REACTION UNSPECIFIED PATIENT HAS TAKEN AMOXICILLIN ON MED  HX FROM [facility] Has patient had a PCN reaction causing immediate rash, facial/tongue/throat swelling, SOB or lightheadedness with hypotension: Yes Has patient had a PCN reaction causing severe rash involving mucus membranes or skin necrosis: No Has patient had a PCN reaction that required hospitalization No Has patient had a PCN reaction occurring within the last 10 years: No If all of the above answers are "NO", then may proceed with  . Demerol [Meperidine] Hives and Nausea And Vomiting     Facility-Administered Medications Prior to Admission  Medication Dose Route Frequency Provider Last Rate Last Dose  . fentaNYL (SUBLIMAZE) injection 100 mcg  100 mcg Intravenous Once Mohammed Kindle, MD      . lactated ringers infusion 1,000 mL  1,000 mL Intravenous Continuous Mohammed Kindle, MD 125 mL/hr at 01/19/16 1011 1,000 mL at 01/19/16 1011  . midazolam (VERSED) 5 MG/5ML injection 5 mg  5 mg Intravenous Once Mohammed Kindle, MD       Medications Prior to Admission  Medication Sig Dispense Refill  . amLODipine (NORVASC) 5 MG tablet Take 1 tablet (5 mg total) by mouth daily. 90 tablet 3  . aspirin EC 81 MG tablet Take 81 mg by mouth daily.    Marland Kitchen b complex vitamins tablet Take 1 tablet by mouth daily.    . brimonidine (ALPHAGAN) 0.2 % ophthalmic solution Place 1 drop into both eyes every morning.     . cetirizine (ZYRTEC) 10 MG tablet Take 10 mg by mouth daily.    . cholecalciferol (VITAMIN D) 400 units TABS tablet Take 400 Units by mouth daily.    . fenofibrate 160 MG tablet Take 1 tablet (160 mg total) by mouth daily. 90 tablet 1  . fentaNYL (DURAGESIC - DOSED MCG/HR) 25 MCG/HR patch Apply one patch to skin every 3 days if tolerated (Patient taking differently: Place 25 mcg onto the skin every 3 (three) days. Apply one patch to skin every 3 days if tolerated) 10 patch 0  . ferrous sulfate 325 (65 FE) MG tablet Take 325 mg by mouth daily with breakfast.    . fluconazole (DIFLUCAN) 200 MG tablet Take 1 tablet (200 mg total) by mouth daily. 14 tablet 0  . FLUoxetine (PROZAC) 20 MG capsule TAKE 1 CAPSULE BY MOUTH EVERY DAY 90 capsule 1  . folic acid (FOLVITE) 1 MG tablet Take 1 mg by mouth 2 (two) times daily.    Marland Kitchen gabapentin (NEURONTIN) 800 MG tablet Limit 1 tablet by mouth 2-3 times per day if tolerated (Patient taking differently: Take 800 mg by mouth 3 (three) times daily. ) 270 tablet 0  . hydrALAZINE (APRESOLINE) 25 MG tablet Take 1 tablet (25 mg total) by mouth 3 (three) times daily.  90 tablet 3  . hydrocortisone cream 1 % Apply 1 application topically daily as needed for itching.    Marland Kitchen lisinopril (PRINIVIL,ZESTRIL) 20 MG tablet Take 1 tablet (20 mg total) by mouth every evening. 90 tablet 3  . Magnesium 400 MG CAPS Take 400 mg by mouth daily.     Marland Kitchen omeprazole (PRILOSEC) 40 MG capsule Take 40 mg by mouth daily.    . simvastatin (ZOCOR) 40 MG tablet Take 1 tablet (40 mg total) by mouth at bedtime. 90 tablet 3  . tamsulosin (FLOMAX) 0.4 MG CAPS capsule TAKE 1 CAPSULE BY MOUTH EVERY DAY 90 capsule 1  . traZODone (DESYREL) 50 MG tablet Take 100 mg by mouth at bedtime as needed for sleep. Reported on 10/09/2015    . vitamin  B-12 (CYANOCOBALAMIN) 1000 MCG tablet Take 1,000 mcg by mouth daily.    . vitamin C (ASCORBIC ACID) 500 MG tablet Take 500 mg by mouth daily.    Marland Kitchen amoxicillin (AMOXIL) 500 MG capsule Take 2,000 mg by mouth as needed (prior to dental procedures). Reported on 11/17/2015    . HYDROcodone-acetaminophen (NORCO/VICODIN) 5-325 MG tablet Limit  2-4 tablets by mouth per day for breakthrough pain while wearing fentanyl patch if tolerated (Patient not taking: Reported on 10/12/2016) 120 tablet 0  . predniSONE (DELTASONE) 5 MG tablet Take 5 mg by mouth daily as needed. Reported on 11/17/2015      Results for orders placed or performed during the hospital encounter of 10/14/16 (from the past 48 hour(s))  Prepare RBC (crossmatch)     Status: None   Collection Time: 10/14/16  5:54 AM  Result Value Ref Range   Order Confirmation ORDER PROCESSED BY BLOOD BANK    No results found.  Review of Systems  Musculoskeletal: Positive for back pain, falls and joint pain.  Neurological: Positive for tingling and sensory change.    Blood pressure (!) 158/55, pulse (!) 56, temperature 97.8 F (36.6 C), resp. rate 18, SpO2 98 %. Physical Exam  Constitutional: He appears well-developed.  HENT:  Head: Normocephalic.  Eyes: Pupils are equal, round, and reactive to light.  Neck: Normal  range of motion.  GI: Soft. Bowel sounds are normal.  Neurological: He has normal strength. GCS eye subscore is 4. GCS verbal subscore is 5. GCS motor subscore is 6.  Strength is 5 out of 5 iliopsoas, quads, hip she's, gastric, and tibialis, and EHL.  Skin: Skin is warm and dry.     Assessment/Plan 76 year old presents for an L5-S1 interbody fusion.  Lonette Stevison P, MD 10/14/2016, 7:23 AM

## 2016-10-14 NOTE — Transfer of Care (Signed)
Immediate Anesthesia Transfer of Care Note  Patient: Bryan [name]  Procedure(s) Performed: Procedure(s): Posterior Lumbar Interbody Fusion - Lumbar five-Sacral one (N/A)  Patient Location: PACU  Anesthesia Type:General  Level of Consciousness: awake, drowsy and patient cooperative  Airway & Oxygen Therapy: Patient Spontanous Breathing and Patient connected to nasal cannula oxygen  Post-op Assessment: Report given to RN, Post -op Vital signs reviewed and stable and Patient moving all extremities X 4  Post vital signs: Reviewed and stable  Last Vitals:  Vitals:   10/14/16 0640 10/14/16 1057  BP:    Pulse:    Resp:    Temp: 36.6 C 36.3 C    Last Pain:  Vitals:   10/14/16 0639  TempSrc: Oral  PainSc:          Complications: No apparent anesthesia complications

## 2016-10-14 NOTE — Progress Notes (Signed)
Pharmacy Antibiotic Note  Bryan [name] is a 76 y.o. male admitted on 10/14/2016 with surgical prophylaxis.  Pharmacy has been consulted for vancomycin dosing. Patient had a drain placed. Afebrile, WBC 3.3.  Patient received vancomycin 1g IV x 1 at 0745 this AM.  Plan: Vancomycin 750mg  IV q12h Monitor clinical progress, c/s, renal function Vancomycin trough as indicated F/u drain removal      Temp (24hrs), Avg:97.6 F (36.4 C), Min:96.8 F (36 C), Max:98 F (36.7 C)   Recent Labs Lab 10/12/16 1033 10/14/16 1611  WBC 3.3*  --   CREATININE  --  1.22    Estimated Creatinine Clearance: 53.1 mL/min (by C-G formula based on SCr of 1.22 mg/dL).    Allergies  Allergen Reactions  . Penicillins Hives and Swelling    SWELLING REACTION UNSPECIFIED PATIENT HAS TAKEN AMOXICILLIN ON MED HX FROM [facility] Has patient had a PCN reaction causing immediate rash, facial/tongue/throat swelling, SOB or lightheadedness with hypotension: Yes Has patient had a PCN reaction causing severe rash involving mucus membranes or skin necrosis: No Has patient had a PCN reaction that required hospitalization No Has patient had a PCN reaction occurring within the last 10 years: No If all of the above answers are "NO", then may proceed with  . Demerol [Meperidine] Hives and Nausea And Vomiting    Elicia Lamp, PharmD, BCPS Clinical Pharmacist 10/14/2016 5:04 PM

## 2016-10-14 NOTE — Op Note (Signed)
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  EBL: Minimal  History of present illness: Patient is very pleasant 50 or gym is a progress worsening back and primarily right leg pain that's been refractory to all forms of conservative treatment by year ago underwent laminectomy discectomy did very well however last weeks and months of progress worsening back and recurrent right leg pain imaging showed a recurrent discoloration severe degenerative collapse causing severe stenosis the L5 and S1 nerve root. Due to patient's failure conservative treatment imaging findings and progressive conical syndrome or recommended redo decompressive laminectomy and fusion at L5-S1 I extensively went over the risks and benefits of the operation the patient as well as perioperative course expectations of outcome and alternatives surgery and he understands and agrees to proceed forward.  Operative procedure: Patient brought into the or was induced under general anesthesia positioned prone the Wilson frame his back was prepped and draped in routine sterile fashion is old incision was opened up and extended cephalocaudal he. Subperiosteal dissections care on the left at L5-S1 and into the scar tissue on the right. Interoperative x-ray confirmed the location. Her level so the spinous process of L5 was removed central decompression was  begun first working on the left side where no previous surgery done performed a complete medial facetectomy and aggressively under bit the superior tickling process and did aggressive foraminotomies of the L5 and S1 nerve roots. They will work the scar tissues dissected the scar tissue free and performed a complete medial facetectomy on the right side decompressed the 5 and 1 roots well. This was a markedly collapsed and sized to space in both sides. Utilizing sequential distraction opened up the disc space and with a 12 distractor in place I selected the 10 mm plasma impregnated peek cages that were packed with locally harvested autograft mixed with BMP. After adequate discectomy and endplate preparation been achieved I inserted one cage removed the distractor prepare the endplates the contralateral side packed dense amount of autograft mix centrally and packed the contralateral cage. In place this under fluoroscopy. Then all pedicle screws are placed and the fluoroscopy pilot holes were drilled cannulated 6 5 x 45 screws were inserted at L5 7 5 x 47 S1 all screws excellent purchase. There was a copiously are good fixing space was maintained aggressive decortication was care MTPs or lateral gutters the remainder the local autograft mix was packed posterior laterally then I simple heads placed 245 mm rods assembled the nuts and compressed L5 against S1. Then the hospital with vancomycin powder explore the foramen to confirm patency no migration of graft material. Then placed a Hemovac drain injected X Perrone the fascia and closed in layers with interrupted Vicryl running 4 septic or Dermabond benzo and Steri-Strips and sterile dressing. At the end of case all needle counts sponge counts were correct.

## 2016-10-14 NOTE — Anesthesia Postprocedure Evaluation (Signed)
Anesthesia Post Note  Patient: Bryan [name]  Procedure(s) Performed: Procedure(s) (LRB): Posterior Lumbar Interbody Fusion - Lumbar five-Sacral one (N/A)  Patient location during evaluation: PACU Anesthesia Type: General Level of consciousness: awake and alert Pain management: pain level controlled Vital Signs Assessment: post-procedure vital signs reviewed and stable Respiratory status: spontaneous breathing, nonlabored ventilation, respiratory function stable and patient connected to nasal cannula oxygen Cardiovascular status: blood pressure returned to baseline and stable Postop Assessment: no signs of nausea or vomiting Anesthetic complications: no       Last Vitals:  Vitals:   10/14/16 1330 10/14/16 1345  BP: (!) 125/54   Pulse: 65 66  Resp: 13 13  Temp:      Last Pain:  Vitals:   10/14/16 1330  TempSrc:   PainSc: Asleep                 Josemaria Brining EDWARD

## 2016-10-15 ENCOUNTER — Encounter (HOSPITAL_COMMUNITY): Payer: Self-pay | Admitting: Family Medicine

## 2016-10-15 ENCOUNTER — Inpatient Hospital Stay (HOSPITAL_COMMUNITY): Payer: Medicare Other

## 2016-10-15 DIAGNOSIS — R112 Nausea with vomiting, unspecified: Secondary | ICD-10-CM | POA: Diagnosis present

## 2016-10-15 LAB — BASIC METABOLIC PANEL
Anion gap: 6 (ref 5–15)
BUN: 18 mg/dL (ref 6–20)
CO2: 26 mmol/L (ref 22–32)
Calcium: 8.7 mg/dL — ABNORMAL LOW (ref 8.9–10.3)
Chloride: 102 mmol/L (ref 101–111)
Creatinine, Ser: 1.02 mg/dL (ref 0.61–1.24)
GFR calc Af Amer: >60 mL/min (ref >60)
GFR calc non Af Amer: >60 mL/min (ref >60)
Glucose, Bld: 142 mg/dL — ABNORMAL HIGH (ref 65–99)
Potassium: 4.1 mmol/L (ref 3.5–5.1)
Sodium: 134 mmol/L — ABNORMAL LOW (ref 135–145)

## 2016-10-15 LAB — CBC WITH DIFFERENTIAL/PLATELET
Basophils Absolute: 0 10*3/uL (ref 0.0–0.1)
Basophils Relative: 0 %
Eosinophils Absolute: 0 10*3/uL (ref 0.0–0.7)
Eosinophils Relative: 0 %
HCT: 26.1 % — ABNORMAL LOW (ref 39.0–52.0)
Hemoglobin: 8.3 g/dL — ABNORMAL LOW (ref 13.0–17.0)
Lymphocytes Relative: 11 %
Lymphs Abs: 0.5 10*3/uL — ABNORMAL LOW (ref 0.7–4.0)
MCH: 30.6 pg (ref 26.0–34.0)
MCHC: 31.8 g/dL (ref 30.0–36.0)
MCV: 96.3 fL (ref 78.0–100.0)
Monocytes Absolute: 0.4 10*3/uL (ref 0.1–1.0)
Monocytes Relative: 9 %
Neutro Abs: 3.6 10*3/uL (ref 1.7–7.7)
Neutrophils Relative %: 80 %
Platelets: 93 10*3/uL — ABNORMAL LOW (ref 150–400)
RBC: 2.71 MIL/uL — ABNORMAL LOW (ref 4.22–5.81)
RDW: 13.9 % (ref 11.5–15.5)
WBC: 4.5 10*3/uL (ref 4.0–10.5)

## 2016-10-15 LAB — GLUCOSE, CAPILLARY
Glucose-Capillary: 161 mg/dL — ABNORMAL HIGH (ref 65–99)
Glucose-Capillary: 108 mg/dL — ABNORMAL HIGH (ref 65–99)

## 2016-10-15 MED ORDER — PROMETHAZINE HCL 25 MG/ML IJ SOLN
12.5000 mg | Freq: Four times a day (QID) | INTRAMUSCULAR | Status: DC | PRN
  Administered 2016-10-15: 12.5 mg via INTRAVENOUS
  Filled 2016-10-15: qty 1

## 2016-10-15 MED ORDER — LISINOPRIL 20 MG PO TABS: 20.0000 mg | ORAL_TABLET | Freq: Every evening | ORAL | Status: DC

## 2016-10-15 MED ORDER — HYDROMORPHONE HCL 2 MG PO TABS: 2.0000 mg | ORAL_TABLET | ORAL | Status: DC | PRN

## 2016-10-15 MED ORDER — SODIUM CHLORIDE 0.9 % IV SOLN
INTRAVENOUS | Status: DC
  Administered 2016-10-15: 1000 mL via INTRAVENOUS
  Administered 2016-10-16: 01:00:00 via INTRAVENOUS

## 2016-10-15 MED ORDER — ONDANSETRON HCL 4 MG/2ML IJ SOLN
4.0000 mg | INTRAMUSCULAR | Status: AC
  Administered 2016-10-15 – 2016-10-16 (×5): 4 mg via INTRAVENOUS
  Filled 2016-10-15 (×5): qty 2

## 2016-10-15 MED ORDER — HYDROCODONE-ACETAMINOPHEN 5-325 MG PO TABS
1.0000 | ORAL_TABLET | ORAL | Status: DC | PRN
  Administered 2016-10-15 – 2016-10-18 (×11): 1 via ORAL
  Filled 2016-10-15 (×12): qty 1

## 2016-10-15 MED ORDER — GABAPENTIN 400 MG PO CAPS
800.0000 mg | ORAL_CAPSULE | Freq: Two times a day (BID) | ORAL | Status: DC
  Administered 2016-10-16 – 2016-10-18 (×5): 800 mg via ORAL
  Filled 2016-10-15 (×7): qty 2

## 2016-10-15 MED ORDER — HYDROMORPHONE HCL 1 MG/ML IJ SOLN
0.5000 mg | INTRAMUSCULAR | Status: DC | PRN
  Administered 2016-10-16 (×2): 0.5 mg via INTRAVENOUS
  Filled 2016-10-15 (×2): qty 1

## 2016-10-15 MED FILL — B-Complex w/ C Tab: ORAL | Qty: 1 | Status: AC

## 2016-10-15 NOTE — Consult Note (Signed)
[facility] Consultation  Bryan [name] FBP:[id number] DOB: 18-Jul-1940 DOA: 10/14/2016 PCP: [name], MD   Requesting physician: Kary Kos Date of consultation: 10/15/16 Reason for consultation: management of medical issues  Impression/Recommendations Principal Problem:   Nausea and vomiting Active Problems:   Essential hypertension   Atherosclerotic heart disease of native coronary artery without angina pectoris   HLD (hyperlipidemia)   Chronic kidney disease   Anemia   Enlarged prostate with lower urinary tract symptoms (LUTS)   Carotid stenosis   DDD (degenerative disc disease), lumbar   Spinal stenosis of lumbar region  #1. Persistent nausea and vomiting. Patient with a history of postanesthesia nausea vomiting per family at bedside. Currently afebrile hemodynamically stable and not hypoxic.  -clear liquid diet -scheduled zofran -discontinue phenergan -urinalysis -follow cbc and bmet  #2. Hypertension. Fair control. Home medications include lisinopril and Norvasc. -Monitor -Resume lisinopril tomorrow -Continue Norvasc  #3. CAD. No chest pain. Chart review indicates "mild to moderate nonobstructive" echo last year reveals EF 55 %, grade 2 diastolic dysfunction.  -Continue aspirin -Continue statin -daily weight -intake and output -monitor  #4. Chronic kidney disease stage III. Chart review indicates as of August 2017 baseline creatinine 1.6-1.7 -Follow labs -Gentle IV fluids -Monitor urine output  #5. Anemia. Patient with a history of chronic iron deficiency anemia. Chart review indicates he was seen in March 2018 by hematology for evaluation of anemia and asymmetric left rectal wall thickening on CT. He had an EGD/colonoscopy in May 2018. Colonoscopy showed nonbleeding internal hemorrhoids diverticulosis polyp status post removal. EGD showed esophagitis. Note indicates in addition mild chronic gastritis/negative for H pylori area dysplasia  or malignancy. Hemoglobin 9.7 days ago.. -Follow CBC -Continue outpatient follow-up with hematology  #6. Rheumatoid arthritis.stable at baseline -continue home med  #7. Spinal stenosis lumbar region. S/p surgery 10/14/16.  -pain control -mobilize patient   Thank you for this consultation. 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 fib, hypertension, hyperlipidemia, underwent lumbar surgery yesterday. This morning he experienced some persistent nausea and vomiting. [facility] hospitalists asked to consult for assistance with management of his medical issues.  Information is obtained from the chart as patient is quite drowsy during exam. she reports during the night his vitals were stable he developed early morning nausea and vomiting. He received Zofran but episodes continued. He was given 12.5 mg of Phenergan as well. At the time of my exam he is quite lethargic but attempts to follow commands.    Review of Systems:  Unable to perform review of systems at this time due to sedation.  Past Medical History:  Diagnosis Date  . Abnormal CT scan    Asymmetric left rectal wall thickening   . Allergy   . Anemia   . Arrhythmia   . Chronic lower back pain   . Complication of anesthesia    Memory loss 09/2015  . Coronary artery disease   . GERD (gastroesophageal reflux disease)   . Glaucoma   . H/O thoracic aortic aneurysm repair   . History of being hospitalized    memory lose kidney funtion down blood pressure up  . History of blood transfusion    "think he had one when he had heart valve OR" (10/14/2016)  . History of  chicken pox   . Hyperlipidemia   . Hypertension   . Lumbar stenosis   . Osteoarthritis    worse in feet  and ankles  . Paroxysmal A-fib ([place])   . Rheumatoid arthritis ([place])    "all over" (10/14/2016)  . Valvular heart disease    Past Surgical History:  Procedure Laterality Date  . AORTIC VALVE REPLACEMENT  2007   [facility].  [place]  . APPENDECTOMY  2010  . BACK SURGERY    . CARDIAC VALVE REPLACEMENT    . CATARACT EXTRACTION W/ INTRAOCULAR LENS  IMPLANT, BILATERAL Bilateral   . COLONOSCOPY WITH PROPOFOL N/A 09/24/2016   Procedure: COLONOSCOPY WITH PROPOFOL;  Surgeon: Jonathon Bellows, MD;  Location: [facility] ENDOSCOPY;  Service: Endoscopy;  Laterality: N/A;  . ESOPHAGOGASTRODUODENOSCOPY (EGD) WITH PROPOFOL N/A 09/24/2016   Procedure: ESOPHAGOGASTRODUODENOSCOPY (EGD) WITH PROPOFOL;  Surgeon: Jonathon Bellows, MD;  Location: [facility] ENDOSCOPY;  Service: Endoscopy;  Laterality: N/A;  . GIVENS CAPSULE STUDY N/A 09/24/2016   Procedure: GIVENS CAPSULE STUDY;  Surgeon: Jonathon Bellows, MD;  Location[facility] ENDOSCOPY;  Service: Endoscopy;  Laterality: N/A;  . HAMMER TOE SURGERY Right 10/09/2015   Procedure: HAMMER TOE REPAIR WITH K-WIRE FIXATION RIGHT SECOND TOE;  Surgeon: Albertine Patricia, DPM;  Location: [place];  Service: Podiatry;  Laterality: Right;  WITH LOCAL  . JOINT REPLACEMENT    . LAPAROSCOPIC CHOLECYSTECTOMY  2010  . LUMBAR LAMINECTOMY/DECOMPRESSION MICRODISCECTOMY Right 03/08/2016   Procedure: Laminectomy and Foraminotomy - Lumbar Five-Sacral One Right;  Surgeon: Kary Kos, MD;  Location: [place];  Service: Neurosurgery;  Laterality: Right;  Right  . MULTIPLE TOOTH EXTRACTIONS     "3"  . POSTERIOR LUMBAR FUSION  10/14/2016   L5-S1  . REPLACEMENT TOTAL KNEE Left 2003  . THORACIC AORTIC ANEURYSM REPAIR  2010   Social History:  reports that he quit smoking about 35 years ago. His smoking use included Cigarettes. He has a 32.00 pack-year smoking history. He has never used smokeless tobacco. He reports that he drinks about 0.6 oz of alcohol per week . He reports that he does not use  drugs.  Allergies  Allergen Reactions  . Penicillins Hives and Swelling    SWELLING REACTION UNSPECIFIED PATIENT HAS TAKEN AMOXICILLIN ON MED HX FROM [facility] Has patient had a PCN reaction causing immediate rash, facial/tongue/throat swelling, SOB or lightheadedness with hypotension: Yes Has patient had a PCN reaction causing severe rash involving mucus membranes or skin necrosis: No Has patient had a PCN reaction that required hospitalization No Has patient had a PCN reaction occurring within the last 10 years: No If all of the above answers are "NO", then may proceed with  . Demerol [Meperidine] Hives and Nausea And Vomiting   Family History  Problem Relation Age of Onset  . Heart failure Mother   . Hypertension Mother   . Asthma Mother   . Heart attack Father 10       MI  . Hypertension Sister     Prior to Admission medications   Medication Sig Start Date End Date Taking? Authorizing Provider  amLODipine (NORVASC) 5 MG tablet Take 1 tablet (5 mg total) by mouth daily. 03/04/16  Yes [name], MD  aspirin EC 81 MG tablet Take 81 mg by mouth daily.   Yes [provider]  b complex vitamins tablet Take 1 tablet by mouth daily.   Yes [provider]  brimonidine (ALPHAGAN) 0.2 % ophthalmic solution Place 1 drop into both eyes  every morning.  10/28/15  Yes [provider]  cetirizine (ZYRTEC) 10 MG tablet Take 10 mg by mouth daily.   Yes [provider]  cholecalciferol (VITAMIN D) 400 units TABS tablet Take 400 Units by mouth daily.   Yes [provider]  fenofibrate 160 MG tablet Take 1 tablet (160 mg total) by mouth daily. 03/29/16  Yes Gollan, Kathlene November, MD  fentaNYL (DURAGESIC - DOSED MCG/HR) 25 MCG/HR patch Apply one patch to skin every 3 days if tolerated Patient taking differently: Place 25 mcg onto the skin every 3 (three) days. Apply one patch to skin every 3 days if tolerated 01/28/16  Yes Mohammed Kindle, MD  ferrous sulfate  325 (65 FE) MG tablet Take 325 mg by mouth daily with breakfast.   Yes [provider]  fluconazole (DIFLUCAN) 200 MG tablet Take 1 tablet (200 mg total) by mouth daily. 10/12/16 10/26/16 Yes Jonathon Bellows, MD  FLUoxetine (PROZAC) 20 MG capsule TAKE 1 CAPSULE BY MOUTH EVERY DAY 09/01/16  Yes [name], MD  folic acid (FOLVITE) 1 MG tablet Take 1 mg by mouth 2 (two) times daily.   Yes [provider]  gabapentin (NEURONTIN) 800 MG tablet Limit 1 tablet by mouth 2-3 times per day if tolerated Patient taking differently: Take 800 mg by mouth 3 (three) times daily.  01/28/16  Yes Mohammed Kindle, MD  hydrALAZINE (APRESOLINE) 25 MG tablet Take 1 tablet (25 mg total) by mouth 3 (three) times daily. 06/29/16  Yes Minna Merritts, MD  hydrocortisone cream 1 % Apply 1 application topically daily as needed for itching.   Yes [provider]  lisinopril (PRINIVIL,ZESTRIL) 20 MG tablet Take 1 tablet (20 mg total) by mouth every evening. 03/08/16  Yes Minna Merritts, MD  Magnesium 400 MG CAPS Take 400 mg by mouth daily.    Yes [provider]  omeprazole (PRILOSEC) 40 MG capsule Take 40 mg by mouth daily.   Yes [provider]  simvastatin (ZOCOR) 40 MG tablet Take 1 tablet (40 mg total) by mouth at bedtime. 12/15/15  Yes Minna Merritts, MD  tamsulosin (FLOMAX) 0.4 MG CAPS capsule TAKE 1 CAPSULE BY MOUTH EVERY DAY 05/06/16  Yes [name], MD  traZODone (DESYREL) 50 MG tablet Take 100 mg by mouth at bedtime as needed for sleep. Reported on 10/09/2015 04/15/15  Yes [provider]  vitamin B-12 (CYANOCOBALAMIN) 1000 MCG tablet Take 1,000 mcg by mouth daily.   Yes [provider]  vitamin C (ASCORBIC ACID) 500 MG tablet Take 500 mg by mouth daily.   Yes [provider]  amoxicillin (AMOXIL) 500 MG capsule Take 2,000 mg by mouth as needed (prior to dental procedures). Reported on 11/17/2015    [provider]   HYDROcodone-acetaminophen (NORCO/VICODIN) 5-325 MG tablet Limit  2-4 tablets by mouth per day for breakthrough pain while wearing fentanyl patch if tolerated Patient not taking: Reported on 10/12/2016 01/28/16   Mohammed Kindle, MD  predniSONE (DELTASONE) 5 MG tablet Take 5 mg by mouth daily as needed. Reported on 11/17/2015    [provider]   Physical Exam: Blood pressure 112/62, pulse 73, temperature 98.7 F (37.1 C), temperature source Oral, resp. rate 18, height 5\' 11"  (1.803 m), weight 83 kg (183 lb), SpO2 94 %. Vitals:   10/15/16 0100 10/15/16 0520  BP: (!) 115/56 112/62  Pulse: 80 73  Resp: 18 18  Temp: 98.5 F (36.9 C) 98.7 F (37.1 C)  General:  Well-nourished, quite lethargic slightly pale very warm to touch  Eyes: Pupils equal round reactive to light EOMI no scleral icterus  ENT: Ears clear nose without drainage oropharynx without erythema or exudate.  Neck: Supple no JVD no lymphadenopathy  Cardiovascular: Rate and rhythm positive murmur no lower extremity edema pedal pulses present and palpable  Respiratory: Normal effort breast was slightly shallow breath sounds somewhat distant. I hear no crackles no wheezes  Abdomen: Nondistended sluggish bowel sounds no guarding or rebounding  Skin: Warm and dry no rashes  Musculoskeletal: Joints without swelling/erythema  Psychiatric: Quite lethargic  Neurologic: Quite lethargic but will arouse briefly to verbal stimuli. Attempts to follow commands.  Labs on Admission:  Basic Metabolic Panel:  Recent Labs Lab 10/14/16 1611  NA 134*  K 5.3*  CL 104  CO2 22  GLUCOSE 128*  BUN 30*  CREATININE 1.22  CALCIUM 8.6*   Liver Function Tests: No results for input(s): AST, ALT, ALKPHOS, BILITOT, PROT, ALBUMIN in the last 168 hours. No results for input(s): LIPASE, AMYLASE in the last 168 hours. No results for input(s): AMMONIA in the last 168 hours. CBC:  Recent Labs Lab 10/12/16 1033  WBC 3.3*   NEUTROABS 2.2  HGB 9.7*  HCT 27.8*  MCV 90.8  PLT 131*   Cardiac Enzymes: No results for input(s): CKTOTAL, CKMB, CKMBINDEX, TROPONINI in the last 168 hours. BNP: Invalid input(s): POCBNP CBG: No results for input(s): GLUCAP in the last 168 hours.  Radiological Exams on Admission: Dg Lumbar Spine 2-3 Views  Result Date: 10/14/2016 CLINICAL DATA:  Lumbar fusion EXAM: LUMBAR SPINE - 2-3 VIEW; DG C-ARM 61-120 MIN COMPARISON:  None. FLUOROSCOPY TIME:  Fluoroscopy Time:  37 seconds Radiation Exposure Index (if provided by the fluoroscopic device): Not available Number of Acquired Spot Images: 2 FINDINGS: Pedicle screws are noted at L5 and S1. Interbody fusion is noted. No soft tissue abnormality is seen. IMPRESSION: L5-S1 fusion Electronically Signed   By: Inez Catalina M.D.   On: 10/14/2016 10:28   Dg C-arm 1-60 Min  Result Date: 10/14/2016 CLINICAL DATA:  Lumbar fusion EXAM: LUMBAR SPINE - 2-3 VIEW; DG C-ARM 61-120 MIN COMPARISON:  None. FLUOROSCOPY TIME:  Fluoroscopy Time:  37 seconds Radiation Exposure Index (if provided by the fluoroscopic device): Not available Number of Acquired Spot Images: 2 FINDINGS: Pedicle screws are noted at L5 and S1. Interbody fusion is noted. No soft tissue abnormality is seen. IMPRESSION: L5-S1 fusion Electronically Signed   By: Inez Catalina M.D.   On: 10/14/2016 10:28    EKG  Time spent: 60 minutes  [facility]  If 7PM-7AM, please contact night-coverage [email or web address] Password [facility] 10/15/2016, 9:13 AM

## 2016-10-15 NOTE — Plan of Care (Signed)
Problem: Pain Management: Goal: Pain level will decrease Outcome: Progressing Patient's pain managed with prn hydrocodone patient satisfied.

## 2016-10-15 NOTE — Progress Notes (Signed)
Patient foley cath dc'ed at 06:56am. Patient is due to void at 1200. Will notify oncoming nurse

## 2016-10-15 NOTE — Progress Notes (Signed)
Subjective: Patient reports Patient condition back pain and nausea has vomited 3 times in the last couple hours.  Objective: Vital signs in last 24 hours: Temp:  [96.8 F (36 C)-98.9 F (37.2 C)] 98.7 F (37.1 C) (05/25 0520) Pulse Rate:  [64-80] 73 (05/25 0520) Resp:  [10-19] 18 (05/25 0520) BP: (87-169)/(43-74) 112/62 (05/25 0520) SpO2:  [89 %-99 %] 94 % (05/25 0520) Weight:  [83 kg (183 lb)] 83 kg (183 lb) (05/24 2102)  Intake/Output from previous day: 05/24 0701 - 05/25 0700 In: 2510 [P.O.:110; I.V.:2000; IV Piggyback:400] Out: 2760 [Urine:2155; Drains:455; Blood:150] Intake/Output this shift: No intake/output data recorded.  Awake alert strength out of 5 wound clean dry and intact  Lab Results:  Recent Labs  10/12/16 1033  WBC 3.3*  HGB 9.7*  HCT 27.8*  PLT 131*   BMET  Recent Labs  10/14/16 1611  NA 134*  K 5.3*  CL 104  CO2 22  GLUCOSE 128*  BUN 30*  CREATININE 1.22  CALCIUM 8.6*    Studies/Results: Dg Lumbar Spine 2-3 Views  Result Date: 10/14/2016 CLINICAL DATA:  Lumbar fusion EXAM: LUMBAR SPINE - 2-3 VIEW; DG C-ARM 61-120 MIN COMPARISON:  None. FLUOROSCOPY TIME:  Fluoroscopy Time:  37 seconds Radiation Exposure Index (if provided by the fluoroscopic device): Not available Number of Acquired Spot Images: 2 FINDINGS: Pedicle screws are noted at L5 and S1. Interbody fusion is noted. No soft tissue abnormality is seen. IMPRESSION: L5-S1 fusion Electronically Signed   By: Inez Catalina M.D.   On: 10/14/2016 10:28   Dg C-arm 1-60 Min  Result Date: 10/14/2016 CLINICAL DATA:  Lumbar fusion EXAM: LUMBAR SPINE - 2-3 VIEW; DG C-ARM 61-120 MIN COMPARISON:  None. FLUOROSCOPY TIME:  Fluoroscopy Time:  37 seconds Radiation Exposure Index (if provided by the fluoroscopic device): Not available Number of Acquired Spot Images: 2 FINDINGS: Pedicle screws are noted at L5 and S1. Interbody fusion is noted. No soft tissue abnormality is seen. IMPRESSION: L5-S1 fusion  Electronically Signed   By: Inez Catalina M.D.   On: 10/14/2016 10:28    Assessment/Plan: Will place on IV fluids given Phenergan for nausea will modify his pain medication and then try to mobilize later today with physical therapy.  LOS: 1 day     Stefano Trulson P 10/15/2016, 8:01 AM

## 2016-10-15 NOTE — Progress Notes (Signed)
Patient refused to get OOB this evening to ambulate or sit on edge of bed.  Several attempts, pt declined.  Patient educated. Continue to monitor. Encourage use of incentive spirometer.

## 2016-10-15 NOTE — Progress Notes (Signed)
Initial Nutrition Assessment  DOCUMENTATION CODES:   Not applicable  INTERVENTION:   -D/c Ensure Enlive po BID, each supplement provides 350 kcal and 20 grams of protein -Boost Breeze po TID, each supplement provides 250 kcal and 9 grams of protein -MVI daily  NUTRITION DIAGNOSIS:   Inadequate oral intake related to nausea as evidenced by meal completion < 25% (refusing medications).   GOAL:   Patient will meet greater than or equal to 90% of their needs  MONITOR:   PO intake, Supplement acceptance, Diet advancement, Labs, Weight trends, Skin, I & O's  REASON FOR ASSESSMENT:   Malnutrition Screening Tool    ASSESSMENT:   76 year old gentleman long-standing back and predominately right leg pain and undergone previous laminotomy initially did very well however last weeks and months is a progress worsening back and right leg pain rating down L5 and S1 nerve root pattern. Workup has revealed severe Gen. 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 Due to patient progressive clinical syndrome failed conservative treatment imaging findings have recommended decompression stabilization procedure at L5-S1.   S/p Procedure on 10/14/16: Redo decompressive lumbar laminotomy L5-S1 with radical foraminotomy the L5 and S1 nerve root with complete medial facetectomies; posterior lumbar interbody fusion L5-S1 utilizing the globus peek cages packed with locally harvested autograft mixed with BMP and vivigen; pedicle screw fixation L5-S1 using the globus Creo modular pedicle screw set; posterior lateral arthrodesis L4-5 utilizing the autograft mix  Pt receiving nursing care at time of visit. Unable to speak with pt or completed nutrition-focused physical exam at this time.   Reviewed wt hx; noted UBW around 195#. Pt has experienced a 11.2% wt loss over the past year, but wt has been stable over the past 6 months.   Staff report that pt has been experiencing persistent nausea and vomiting.  Noted he has been refusing medications. He is currently on a clear liquid diet.   Labs reviewed: Na: 134 (on IV supplementation).   Diet Order:  Diet clear liquid Room service appropriate? Yes; Fluid consistency: Thin  Skin:  Wound (see comment) (closed back incision)  Last BM:  10/13/16  Height:   Ht Readings from Last 1 Encounters:  10/14/16 5\' 11"  (1.803 m)    Weight:   Wt Readings from Last 1 Encounters:  10/14/16 183 lb (83 kg)    Ideal Body Weight:  78.2 kg  BMI:  Body mass index is 25.52 kg/m.  Estimated Nutritional Needs:   Kcal:  2000-2200  Protein:  100-115 grams  Fluid:  2.0-2.2 L  EDUCATION NEEDS:   Education needs no appropriate at this time  Evalette Montrose A. Jimmye Norman, RD, LDN, CDE Pager: [phone] After hours Pager: [phone]

## 2016-10-15 NOTE — Progress Notes (Signed)
OT Cancellation Note  Patient Details Name: Bryan [name] MRN: [id number] DOB: [birth year]   Cancelled Treatment:    Reason Eval/Treat Not Completed: Fatigue/lethargy limiting ability to participate. Pt had just completed session with PT. Rated pain 7/10 and requested that OT come back tomorrow. Reviewed back precautions with Pt and wife.   [place] 10/15/2016, 3:53 PM  Hulda Humphrey OTR/L [phone]

## 2016-10-15 NOTE — Evaluation (Signed)
Physical Therapy Evaluation Patient Details Name: Bryan [name] MRN: [id number] DOB: 07-29-1940 Today's Date: 10/15/2016   History of Present Illness  76 year old male s/p Posterior Lumbar Interbody Fusion - L5-S1. Pt has a past medical history including Anemia; Arrhythmia; Chronic lower back pain; Coronary artery disease; GERD ; Glaucoma; H/O thoracic aortic aneurysm repair; Hyperlipidemia; Hypertension; Lumbar stenosis; Osteoarthritis; Paroxysmal A-fib; Rheumatoid arthritis; Valvular heart disease;  has a past surgical history that includes Thoracic aortic aneurysm repair (2010); Aortic valve replacement (2007); Replacement total knee (Left, 2003); Cataract extraction w/ intraocular lens  implant, bilateral (Bilateral); Lumbar laminectomy/decompression microdiscectomy (Right, 03/08/2016)  Clinical Impression  Pt presented supine in bed with HOB elevated, awake and willing to participate in therapy session. Prior to admission, pt reported that he ambulated with use of SPC and was independent with ADLs. Pt's wife was present during session and stated that she could provide 24/7 assistance; however, unsure of how much physical assistance she could truly provide as she uses a rollator to ambulate. Pt ambulated a short distance in hallway with min A using a RW. Pt with modest instability and reported dizziness during ambulation, therefore limited distance. Based on pt's performance during evaluation, PT recommending a ST post-acute rehab; however, hopeful that he will make good progress to potentially go home with HHPT. Pt would continue to benefit from skilled physical therapy services at this time while admitted and after d/c to address the below listed limitations in order to improve overall safety and independence with functional mobility.     Follow Up Recommendations SNF;Supervision/Assistance - 24 hour;Other (comment) (hopeful that pt will progress w/ therapy to d/c home w/ HHPT)    Equipment  Recommendations  None recommended by PT    Recommendations for Other Services       Precautions / Restrictions Precautions Precautions: Fall;Back Precaution Comments: PT reviewed 3/3 back precautions with pt and pt's wife Other Brace/Splint: "No Brace Needed" per orders; however, lumbar corset in room was used during session Restrictions Weight Bearing Restrictions: No      Mobility  Bed Mobility Overal bed mobility: Needs Assistance Bed Mobility: Rolling;Sidelying to Sit;Sit to Sidelying Rolling: Min assist Sidelying to sit: Min assist     Sit to sidelying: Min assist General bed mobility comments: increased time and effort, vc'ing for log roll technique, use of bed rails, min A to elevate trunk to achieve sitting EOB and min A at bilateral LEs to return to sidelying  Transfers Overall transfer level: Needs assistance Equipment used: Rolling walker (2 wheeled) Transfers: Sit to/from Stand Sit to Stand: Min assist;From elevated surface         General transfer comment: increased time and effort, vc'ing for bilateral hand placement, min A for stability and to rise into full standing position from an elevated bed position  Ambulation/Gait Ambulation/Gait assistance: Min assist Ambulation Distance (Feet): 40 Feet Assistive device: Rolling walker (2 wheeled) Gait Pattern/deviations: Step-through pattern;Decreased step length - right;Decreased step length - left;Decreased stride length;Shuffle;Drifts right/left Gait velocity: decreased Gait velocity interpretation: Below normal speed for age/gender General Gait Details: modest instability that required constant min A for safety. pt also reporting mild dizziness and light headedness with ambulation.   Stairs            Wheelchair Mobility    Modified Rankin (Stroke Patients Only)       Balance Overall balance assessment: Needs assistance Sitting-balance support: Feet supported Sitting balance-Leahy Scale:  Fair Sitting balance - Comments: pt able to sit  EOB with close supervision, max A to don lumbar corset   Standing balance support: During functional activity;Bilateral upper extremity supported Standing balance-Leahy Scale: Poor Standing balance comment: pt reliant on bilateral UEs on RW                             Pertinent Vitals/Pain Pain Assessment: 0-10 Pain Score: 7  Pain Location: back Pain Descriptors / Indicators: Sore Pain Intervention(s): Monitored during session;Repositioned    Home Living Family/patient expects to be discharged to:: Private residence Living Arrangements: Spouse/significant other Available Help at Discharge: Family;Available 24 hours/day Type of Home: Other(Comment) (condo) Home Access: Level entry     Home Layout: One level Home Equipment: Walker - 2 wheels;Walker - 4 wheels;Cane - single point;Shower seat      Prior Function Level of Independence: Independent with assistive device(s)         Comments: pt ambulates with use of SPC and independent with ADLs     Hand Dominance        Extremity/Trunk Assessment   Upper Extremity Assessment Upper Extremity Assessment: Generalized weakness    Lower Extremity Assessment Lower Extremity Assessment: Generalized weakness    Cervical / Trunk Assessment Cervical / Trunk Assessment: Other exceptions Cervical / Trunk Exceptions: s/p low back sx  Communication   Communication: No difficulties  Cognition Arousal/Alertness: Lethargic;Suspect due to medications Behavior During Therapy: [facility] for tasks assessed/performed Overall Cognitive Status: Impaired/Different from baseline Area of Impairment: Following commands;Safety/judgement;Problem solving                       Following Commands: Follows one step commands consistently;Follows one step commands with increased time Safety/Judgement: Decreased awareness of safety;Decreased awareness of deficits   Problem Solving:  Slow processing;Difficulty sequencing;Requires verbal cues;Requires tactile cues        General Comments      Exercises     Assessment/Plan    PT Assessment Patient needs continued PT services  PT Problem List Decreased strength;Decreased activity tolerance;Decreased balance;Decreased mobility;Decreased coordination;Decreased knowledge of use of DME;Decreased knowledge of precautions;Decreased safety awareness;Pain       PT Treatment Interventions DME instruction;Gait training;Stair training;Functional mobility training;Therapeutic activities;Therapeutic exercise;Balance training;Neuromuscular re-education;Patient/family education    PT Goals (Current goals can be found in the Care Plan section)  Acute Rehab PT Goals Patient Stated Goal: decrease pain PT Goal Formulation: With patient/family Time For Goal Achievement: 10/29/16 Potential to Achieve Goals: Good    Frequency Min 5X/week   Barriers to discharge        Co-evaluation               AM-PAC PT "6 Clicks" Daily Activity  Outcome Measure Difficulty turning over in bed (including adjusting bedclothes, sheets and blankets)?: Total Difficulty moving from lying on back to sitting on the side of the bed? : Total Difficulty sitting down on and standing up from a chair with arms (e.g., wheelchair, bedside commode, etc,.)?: Total Help needed moving to and from a bed to chair (including a wheelchair)?: A Little Help needed walking in hospital room?: A Little Help needed climbing 3-5 steps with a railing? : A Lot 6 Click Score: 11    End of Session Equipment Utilized During Treatment: Gait belt;Back brace Activity Tolerance: Patient limited by lethargy Patient left: in bed;with call bell/phone within reach;with bed alarm set;with family/visitor present Nurse Communication: Mobility status;Other (comment) (dislodged condom catheter) PT Visit Diagnosis: Other abnormalities of  gait and mobility (R26.89);Pain Pain -  part of body:  (back)    Time: 8208-1388 PT Time Calculation (min) (ACUTE ONLY): 24 min   Charges:   PT Evaluation $PT Eval Moderate Complexity: 1 Procedure PT Treatments $Gait Training: 8-22 mins   PT G Codes:        Sherie Don, PT, DPT [place] 10/15/2016, 4:08 PM

## 2016-10-15 NOTE — Progress Notes (Signed)
OT Cancellation Note  Patient Details Name: Bryan [name] MRN: [id number] DOB: [birth year]   Cancelled Treatment:    Reason Eval/Treat Not Completed: Medical issues which prohibited therapy. Spoke with RN who states that the Pt has been very nauseous this morning, Pt given medicine to combat nausea and is now very lethargic and sleepy. RN requests that Therapy see this afternoon. OT will check back for evaluation as schedule allows.   [place] 10/15/2016, 9:59 AM  Hulda Humphrey OTR/L [phone]

## 2016-10-15 NOTE — Consult Note (Signed)
[facility] CM Primary Care Navigator  10/15/2016  KEIRON IODICE 1941-03-25 [id number]   Met with patient and wife Tamela Oddi) at the bedside to identify possible discharge needs. Patient reports having worsening pain to back radiating down to feet (more on the right) that had led to this admission/ surgery. Patient endorses Dr. [name] with [place] [facility] as hisprimary care provider.   Patientshared using [facility] in [place] obtain medications without difficulty.   Patient states managinghis own medications at home using "pill box" system weekly.  Patient was driving prior to admission. Wife and church friends (Ed and Izora Gala) will be providingtransportation to his doctors' appointments after discharge.  Patient's wife will be hisprimary caregiverat homeand his 2 sisters Baker Janus and Allayne Butcher) will provide assistance with care as well.   Anticipated discharge plan is home per wife.  Still awaiting PT/ OT recommendations and physician order for discharge according to patient.   Patient and wife voiced understanding to call primary care provider's office when he returns home, for a post discharge follow-up appointment within a week or sooner if needs arise.Patient letter (with PCP's contact number) was provided as areminder.  Patient and wife communicated no anyhealth management needs or concerns at this time.  For questions, please contact:  Dannielle Huh, BSN, RN- [facility] Primary Care Navigator  Telephone: [phone] [place]

## 2016-10-16 DIAGNOSIS — R112 Nausea with vomiting, unspecified: Secondary | ICD-10-CM

## 2016-10-16 DIAGNOSIS — N189 Chronic kidney disease, unspecified: Secondary | ICD-10-CM

## 2016-10-16 DIAGNOSIS — D649 Anemia, unspecified: Secondary | ICD-10-CM

## 2016-10-16 DIAGNOSIS — I10 Essential (primary) hypertension: Secondary | ICD-10-CM

## 2016-10-16 DIAGNOSIS — I6523 Occlusion and stenosis of bilateral carotid arteries: Secondary | ICD-10-CM

## 2016-10-16 LAB — URINALYSIS, ROUTINE W REFLEX MICROSCOPIC
Bilirubin Urine: NEGATIVE
Glucose, UA: NEGATIVE mg/dL
Hgb urine dipstick: NEGATIVE
Ketones, ur: NEGATIVE mg/dL
Nitrite: NEGATIVE
Protein, ur: 30 mg/dL — AB
Specific Gravity, Urine: 1.013 (ref 1.005–1.030)
Squamous Epithelial / LPF: NONE SEEN
pH: 5 (ref 5.0–8.0)

## 2016-10-16 MED ORDER — ATORVASTATIN CALCIUM 10 MG PO TABS
20.0000 mg | ORAL_TABLET | Freq: Every day | ORAL | Status: DC
  Administered 2016-10-16 – 2016-10-17 (×2): 20 mg via ORAL
  Filled 2016-10-16 (×2): qty 2

## 2016-10-16 MED ORDER — FLUCONAZOLE 100 MG PO TABS
200.0000 mg | ORAL_TABLET | Freq: Every day | ORAL | Status: DC
  Administered 2016-10-17 – 2016-10-18 (×2): 200 mg via ORAL
  Filled 2016-10-16 (×2): qty 2

## 2016-10-16 MED ORDER — LISINOPRIL 20 MG PO TABS
20.0000 mg | ORAL_TABLET | Freq: Every evening | ORAL | Status: DC
  Administered 2016-10-16 – 2016-10-17 (×2): 20 mg via ORAL
  Filled 2016-10-16 (×2): qty 1

## 2016-10-16 NOTE — Progress Notes (Signed)
PROGRESS NOTE    Bryan [name]  JGG:[id number]  DOB: 07/02/1940  DOA: 10/14/2016 PCP: [name], MD Outpatient Specialists:   Hospital course: Bryan [name] 76 year old gentleman with a history of CAD, bicuspid aortic valve with a sending aortic aneurysm status post aortic valve replacement, paroxysmal A. fib, hypertension, hyperlipidemia, underwent lumbar surgery yesterday. This morning he experienced some persistent nausea and vomiting. [facility] asked to consult for assistance with management of his medical issues.  Assessment & Plan:   #1. nausea and vomiting. Resolved now. Patient with a history of postanesthesia nausea vomiting per family at bedside. Currently afebrile hemodynamically stable and not hypoxic.  -advanced to regular diet -scheduled zofran -discontinue phenergan -urinalysis -follow cbc and bmet  #2. Hypertension. Fair control. Home medications include lisinopril and Norvasc. -Monitor -Resume lisinopril 5/26 -Continue Norvasc  #3. CAD. No chest pain. Chart review indicates "mild to moderate nonobstructive" echo last year reveals EF 55 %, grade 2 diastolic dysfunction.  -Continue aspirin -Continue statin  #4. Chronic kidney disease stage III. Chart review indicates as of August 2017 baseline creatinine 1.6-1.7 -Follow labs -DC IV fluids -Good urine output  #5. Anemia. Patient with a history of chronic iron deficiency anemia. Chart review indicates he was seen in March 2018 by hematology for evaluation of anemia and asymmetric left rectal wall thickening on CT. He had an EGD/colonoscopy in May 2018. Colonoscopy showed nonbleeding internal hemorrhoids diverticulosis polyp status post removal. EGD showed esophagitis. Note indicates in addition mild chronic gastritis/negative for H pylori area dysplasia or malignancy. Hemoglobin 9.7 days ago.. -Continue outpatient follow-up with hematology  #6. Rheumatoid arthritis.stable at  baseline -continue home med  #7. Spinal stenosis lumbar region. S/p surgery 10/14/16.  -pain control -mobilize patient  Subjective: Nausea and vomiting has resolved.   Objective: Vitals:   10/14/16 2102 10/16/16 0126 10/16/16 0420 10/16/16 1000  BP:  (!) 173/63 (!) 168/72 (!) 170/64  Pulse:  67 71 68  Resp:  20 18 18   Temp:  98.1 F (36.7 C) 98.6 F (37 C) 98.1 F (36.7 C)  TempSrc:  Oral Oral Axillary  SpO2:  94% 98% 94%  Weight: 83 kg (183 lb)     Height: 5\' 11"  (1.803 m)       Intake/Output Summary (Last 24 hours) at 10/16/16 1101 Last data filed at 10/16/16 0657  Gross per 24 hour  Intake          1896.25 ml  Output             1150 ml  Net           746.25 ml   Filed Weights   10/14/16 2102  Weight: 83 kg (183 lb)    Exam:  General exam: awake, alert, NAD.  Respiratory system: Clear. No increased work of breathing. Cardiovascular system: S1 & S2 heard,. No JVD. Gastrointestinal system: Abdomen is nondistended, soft and nontender. Normal bowel sounds heard. Central nervous system: Alert and oriented. No focal neurological deficits. Extremities: no cyanosis.  Data Reviewed: Basic Metabolic Panel:  Recent Labs Lab 10/14/16 1611 10/15/16 0830  NA 134* 134*  K 5.3* 4.1  CL 104 102  CO2 22 26  GLUCOSE 128* 142*  BUN 30* 18  CREATININE 1.22 1.02  CALCIUM 8.6* 8.7*   Liver Function Tests: No results for input(s): AST, ALT, ALKPHOS, BILITOT, PROT, ALBUMIN in the last 168 hours. No results for input(s): LIPASE, AMYLASE in the last 168 hours. No results for input(s): AMMONIA in the  last 168 hours. CBC:  Recent Labs Lab 10/12/16 1033 10/15/16 0830  WBC 3.3* 4.5  NEUTROABS 2.2 3.6  HGB 9.7* 8.3*  HCT 27.8* 26.1*  MCV 90.8 96.3  PLT 131* 93*   Cardiac Enzymes: No results for input(s): CKTOTAL, CKMB, CKMBINDEX, TROPONINI in the last 168 hours. CBG (last 3)   Recent Labs  10/15/16 1217 10/15/16 1640  GLUCAP 161* 108*   Recent Results  (from the past 240 hour(s))  Surgical pcr screen     Status: None   Collection Time: 10/06/16  1:47 PM  Result Value Ref Range Status   MRSA, PCR NEGATIVE NEGATIVE Final   Staphylococcus aureus NEGATIVE NEGATIVE Final    Comment:        The Xpert SA Assay (FDA approved for NASAL specimens in patients over 53 years of age), is one component of a comprehensive surveillance program.  Test performance has been validated by [facility] for patients greater than or equal to 32 year old. It is not intended to diagnose infection nor to guide or monitor treatment.      Studies: Dg Chest Port 1 View  Result Date: 10/15/2016 CLINICAL DATA:  Hypoxia EXAM: PORTABLE CHEST 1 VIEW COMPARISON:  CT chest 08/09/2016 FINDINGS: Postop median sternotomy and open heart surgery. Heart size within normal limits. Negative for heart failure Mild bibasilar atelectasis and scarring in the bases left greater than right as noted on CT. No effusion. IMPRESSION: Mild left lower lobe atelectasis and scarring. Negative for heart failure Electronically Signed   By: Franchot Gallo M.D.   On: 10/15/2016 15:34   Scheduled Meds: . amLODipine  5 mg Oral Daily  . aspirin EC  81 mg Oral Daily  . B-complex with vitamin C  1 tablet Oral Daily  . brimonidine  1 drop Both Eyes q morning - 10a  . cholecalciferol  400 Units Oral Daily  . fenofibrate  160 mg Oral Daily  . [START ON 10/17/2016] fentaNYL  25 mcg Transdermal Q72H  . ferrous sulfate  325 mg Oral Q breakfast  . fluconazole  200 mg Oral Daily  . FLUoxetine  20 mg Oral Daily  . folic acid  1 mg Oral BID  . gabapentin  800 mg Oral BID  . hydrALAZINE  25 mg Oral TID  . lisinopril  20 mg Oral QPM  . loratadine  10 mg Oral Daily  . magnesium oxide  400 mg Oral Daily  . pantoprazole (PROTONIX) IV  40 mg Intravenous QHS  . simvastatin  40 mg Oral QHS  . sodium chloride flush  3 mL Intravenous Q12H  . tamsulosin  0.4 mg Oral Daily  . vitamin B-12  1,000 mcg Oral  Daily  . vitamin C  500 mg Oral Daily   Continuous Infusions: . sodium chloride 250 mL (10/14/16 1801)  . sodium chloride 75 mL/hr at 10/16/16 0126  . lactated ringers    . 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

## 2016-10-16 NOTE — Progress Notes (Signed)
Vitals:   10/14/16 2102 10/16/16 0126 10/16/16 0420 10/16/16 1000  BP:  (!) 173/63 (!) 168/72 (!) 170/64  Pulse:  67 71 68  Resp:  20 18 18   Temp:  98.1 F (36.7 C) 98.6 F (37 C) 98.1 F (36.7 C)  TempSrc:  Oral Oral Axillary  SpO2:  94% 98% 94%  Weight: 83 kg (183 lb)     Height: 5\' 11"  (1.803 m)       CBC  Recent Labs  10/15/16 0830  WBC 4.5  HGB 8.3*  HCT 26.1*  PLT 93*   BMET  Recent Labs  10/14/16 1611 10/15/16 0830  NA 134* 134*  K 5.3* 4.1  CL 104 102  CO2 22 26  GLUCOSE 128* 142*  BUN 30* 18  CREATININE 1.22 1.02  CALCIUM 8.6* 8.7*    Patient resting in bed, comfortably. Has ambulated this morning with PT. Case discussed with his nurse and physical therapist. They feel that he needs further rehabilitation prior to decision regarding disposition. No further nausea or vomiting.  Plan: Continuing PT and OT.  Hosie Spangle, MD 10/16/2016, 10:19 AM

## 2016-10-16 NOTE — Evaluation (Signed)
Occupational Therapy Evaluation Patient Details Name: Bryan [name] MRN: [id number] DOB: [birth year] Today's Date: 10/16/2016    History of Present Illness 76 year old male s/p Posterior Lumbar Interbody Fusion - L5-S1. Pt has a past medical history including Anemia; Arrhythmia; Chronic lower back pain; Coronary artery disease; GERD ; Glaucoma; H/O thoracic aortic aneurysm repair; Hyperlipidemia; Hypertension; Lumbar stenosis; Osteoarthritis; Paroxysmal A-fib; Rheumatoid arthritis; Valvular heart disease;  has a past surgical history that includes Thoracic aortic aneurysm repair (2010); Aortic valve replacement (2007); Replacement total knee (Left, 2003); Cataract extraction w/ intraocular lens  implant, bilateral (Bilateral); Lumbar laminectomy/decompression microdiscectomy (Right, 03/08/2016)   Clinical Impression   This 76 yo male admitted and underwent above presents to acute OT with deficits below (thus affecting his PLOF of being totally independent with basic ADLs. He will benefit from acute OT with follow up OT at SNF or [place] depending on progress.    Follow Up Recommendations  SNF;Other (comment) (but may progress to [facility]))    Equipment Recommendations  3 in 1 bedside commode       Precautions / Restrictions Precautions Precautions: Fall;Back Precaution Booklet Issued: Yes (comment) Precaution Comments: PT reviewed 3/3 back precautions with pt and pt's wife Required Braces or Orthoses: Other Brace/Splint Other Brace/Splint: "No Brace Needed" per orders; however, lumbar corset in room was used during session      Mobility Bed Mobility Overal bed mobility: Needs Assistance Bed Mobility: Rolling;Sidelying to Sit Rolling: Min guard Sidelying to sit: Min guard       General bed mobility comments: increased time and effort, good recall of log roll technique, use of bed rails, min guard for safety  Transfers Overall transfer level: Needs assistance Equipment used:  Rolling walker (2 wheeled) Transfers: Sit to/from Stand Sit to Stand: Min assist;From elevated surface         General transfer comment: increased time and effort, vc'ing for bilateral hand placement, min A for stability and to rise into full standing position from an elevated bed position    Balance Overall balance assessment: Needs assistance Sitting-balance support: Feet supported Sitting balance-Leahy Scale: Fair     Standing balance support: During functional activity;Single extremity supported Standing balance-Leahy Scale: Poor Standing balance comment: pt reliant on at least one UE with ADL activity at sink                           ADL either performed or assessed with clinical judgement   ADL Overall ADL's : Needs assistance/impaired Eating/Feeding: Independent;Sitting   Grooming: Min guard;Standing;Wash/dry face   Upper Body Bathing: Set up;Supervision/ safety;Sitting   Lower Body Bathing: Maximal assistance Lower Body Bathing Details (indicate cue type and reason): min A sit<>stand Upper Body Dressing : Minimal assistance;Sitting   Lower Body Dressing: Maximal assistance Lower Body Dressing Details (indicate cue type and reason): min A sit<>stand Toilet Transfer: Minimal assistance;Ambulation;RW   Toileting- Clothing Manipulation and Hygiene: Moderate assistance Toileting - Clothing Manipulation Details (indicate cue type and reason): min A sit<>stand             Vision Patient Visual Report: No change from baseline              Pertinent Vitals/Pain Pain Assessment: 0-10 Pain Score: 8  Pain Location: back Pain Descriptors / Indicators: Sore Pain Intervention(s): Monitored during session;Repositioned     Hand Dominance Right   Extremity/Trunk Assessment Upper Extremity Assessment Upper Extremity Assessment: Generalized weakness  Communication Communication Communication: No difficulties   Cognition  Arousal/Alertness: Awake/alert Behavior During Therapy: WFL for tasks assessed/performed Overall Cognitive Status: Within Functional Limits for tasks assessed                                                Home Living Family/patient expects to be discharged to:: Private residence Living Arrangements: Spouse/significant other Available Help at Discharge: Family;Available 24 hours/day Type of Home: Other(Comment) (condo) Home Access: Level entry     Home Layout: One level     Bathroom Shower/Tub: Tub/shower unit;Walk-in shower   Bathroom Toilet: Handicapped height     Home Equipment: Environmental consultant - 2 wheels;Walker - 4 wheels;Cane - single point;Shower seat          Prior Functioning/Environment Level of Independence: Independent with assistive device(s)        Comments: pt ambulates with use of SPC and independent with ADLs        OT Problem List: Decreased strength;Decreased range of motion;Impaired balance (sitting and/or standing);Pain      OT Treatment/Interventions: Self-care/ADL training;Patient/family education;DME and/or AE instruction;Balance training    OT Goals(Current goals can be found in the care plan section) Acute Rehab OT Goals Patient Stated Goal: to be able to move again by myself OT Goal Formulation: With patient Time For Goal Achievement: 10/23/16 Potential to Achieve Goals: Good  OT Frequency: Min 2X/week           Co-evaluation PT/OT/SLP Co-Evaluation/Treatment: Yes Reason for Co-Treatment: To address functional/ADL transfers   OT goals addressed during session: ADL's and self-care;Strengthening/ROM      AM-PAC PT "6 Clicks" Daily Activity     Outcome Measure Help from another person eating meals?: None Help from another person taking care of personal grooming?: A Little Help from another person toileting, which includes using toliet, bedpan, or urinal?: A Lot Help from another person bathing (including washing,  rinsing, drying)?: A Lot Help from another person to put on and taking off regular upper body clothing?: A Little Help from another person to put on and taking off regular lower body clothing?: A Lot 6 Click Score: 16   End of Session Equipment Utilized During Treatment: Gait belt;Rolling walker;Back brace Nurse Communication: Mobility status  Activity Tolerance: Patient tolerated treatment well Patient left: in chair;with call bell/phone within reach;with chair alarm set  OT Visit Diagnosis: Unsteadiness on feet (R26.81);Pain Pain - part of body:  (back)                Time: 4709-6283 OT Time Calculation (min): 23 min Charges:  OT General Charges $OT Visit: 1 Procedure OT Evaluation $OT Eval Moderate Complexity: 1 Procedure [place], OTR/L [phone] 10/16/2016

## 2016-10-16 NOTE — Progress Notes (Signed)
Patient continues to need education on importance of mobility for progress of care/healing, continued education and use of incentive spirometer.  Patient agreeable to stand at edge of bed for use of urinal and also use BSC. Patient unable to ambulate to bathroom at this time.  Patient with minimal c/o pain.  No episodes of vomiting.  Continue to monitor.

## 2016-10-16 NOTE — Progress Notes (Signed)
Physical Therapy Treatment Patient Details Name: Bryan [name] MRN: [id number] DOB: 08/01/1940 Today's Date: 10/16/2016    History of Present Illness 75 year old male s/p Posterior Lumbar Interbody Fusion - L5-S1. Pt has a past medical history including Anemia; Arrhythmia; Chronic lower back pain; Coronary artery disease; GERD ; Glaucoma; H/O thoracic aortic aneurysm repair; Hyperlipidemia; Hypertension; Lumbar stenosis; Osteoarthritis; Paroxysmal A-fib; Rheumatoid arthritis; Valvular heart disease;  has a past surgical history that includes Thoracic aortic aneurysm repair (2010); Aortic valve replacement (2007); Replacement total knee (Left, 2003); Cataract extraction w/ intraocular lens  implant, bilateral (Bilateral); Lumbar laminectomy/decompression microdiscectomy (Right, 03/08/2016)    PT Comments    Pt making steady progress with mobility and tolerated ambulating in hallway with min guard and use of RW. Pt would continue to benefit from skilled physical therapy services at this time while admitted and after d/c to address the below listed limitations in order to improve overall safety and independence with functional mobility.    Follow Up Recommendations  SNF;Supervision/Assistance - 24 hour;Other (comment) (hopeful that pt will progress w/ therapy to d/c home w/ HHPT)     Equipment Recommendations  None recommended by PT    Recommendations for Other Services       Precautions / Restrictions Precautions Precautions: Fall;Back Precaution Comments: PT reviewed 3/3 back precautions with pt and pt's wife Required Braces or Orthoses: Other Brace/Splint Other Brace/Splint: "No Brace Needed" per orders; however, lumbar corset in room was used during session Restrictions Weight Bearing Restrictions: No    Mobility  Bed Mobility Overal bed mobility: Needs Assistance Bed Mobility: Rolling;Sidelying to Sit Rolling: Min guard Sidelying to sit: Min guard       General bed  mobility comments: increased time and effort, good recall of log roll technique, use of bed rails, min guard for safety  Transfers Overall transfer level: Needs assistance Equipment used: Rolling walker (2 wheeled) Transfers: Sit to/from Stand Sit to Stand: Min assist;From elevated surface         General transfer comment: increased time and effort, vc'ing for bilateral hand placement, min A for stability and to rise into full standing position from an elevated bed position  Ambulation/Gait Ambulation/Gait assistance: Min guard Ambulation Distance (Feet): 75 Feet Assistive device: Rolling walker (2 wheeled) Gait Pattern/deviations: Step-through pattern;Decreased stride length;Trunk flexed Gait velocity: decreased Gait velocity interpretation: Below normal speed for age/gender General Gait Details: verbal cueing for improved posture and forward gaze, min guard for safety   Stairs            Wheelchair Mobility    Modified Rankin (Stroke Patients Only)       Balance Overall balance assessment: Needs assistance Sitting-balance support: Feet supported Sitting balance-Leahy Scale: Fair     Standing balance support: During functional activity;Single extremity supported Standing balance-Leahy Scale: Poor Standing balance comment: pt reliant on at least one UE with ADL activity at sink                            Cognition Arousal/Alertness: Awake/alert Behavior During Therapy: WFL for tasks assessed/performed Overall Cognitive Status: Within Functional Limits for tasks assessed                                        Exercises      General Comments        Pertinent  Vitals/Pain Pain Assessment: 0-10 Pain Score: 8  Pain Location: back Pain Descriptors / Indicators: Sore Pain Intervention(s): Monitored during session;Repositioned    Home Living                      Prior Function            PT Goals (current goals can  now be found in the care plan section) Acute Rehab PT Goals PT Goal Formulation: With patient/family Time For Goal Achievement: 10/29/16 Potential to Achieve Goals: Good Progress towards PT goals: Progressing toward goals    Frequency    Min 5X/week      PT Plan Current plan remains appropriate    Co-evaluation PT/OT/SLP Co-Evaluation/Treatment: Yes Reason for Co-Treatment: To address functional/ADL transfers;For patient/therapist safety PT goals addressed during session: Mobility/safety with mobility;Balance;Proper use of DME;Strengthening/ROM        AM-PAC PT "6 Clicks" Daily Activity  Outcome Measure  Difficulty turning over in bed (including adjusting bedclothes, sheets and blankets)?: A Little Difficulty moving from lying on back to sitting on the side of the bed? : A Little Difficulty sitting down on and standing up from a chair with arms (e.g., wheelchair, bedside commode, etc,.)?: Total Help needed moving to and from a bed to chair (including a wheelchair)?: A Little Help needed walking in hospital room?: A Little Help needed climbing 3-5 steps with a railing? : A Lot 6 Click Score: 15    End of Session Equipment Utilized During Treatment: Gait belt;Back brace Activity Tolerance: Patient limited by fatigue Patient left: in chair;with call bell/phone within reach;with chair alarm set Nurse Communication: Mobility status PT Visit Diagnosis: Other abnormalities of gait and mobility (R26.89);Pain Pain - part of body:  (back)     Time: 2956-2130 PT Time Calculation (min) (ACUTE ONLY): 23 min  Charges:  $Gait Training: 8-22 mins                    G Codes:       [place], [place], [place] Jessie 10/16/2016, 10:03 AM

## 2016-10-17 DIAGNOSIS — E782 Mixed hyperlipidemia: Secondary | ICD-10-CM

## 2016-10-17 MED ORDER — SULFAMETHOXAZOLE-TRIMETHOPRIM 800-160 MG PO TABS
1.0000 | ORAL_TABLET | Freq: Two times a day (BID) | ORAL | Status: DC
  Administered 2016-10-17 – 2016-10-18 (×2): 1 via ORAL
  Filled 2016-10-17 (×3): qty 1

## 2016-10-17 NOTE — Progress Notes (Signed)
Physical Therapy Treatment Patient Details Name: Bryan [name] MRN: [id number] DOB: [birth year] Today's Date: 10/17/2016    History of Present Illness 76 year old male s/p Posterior Lumbar Interbody Fusion - L5-S1. Pt has a past medical history including Anemia; Arrhythmia; Chronic lower back pain; Coronary artery disease; GERD ; Glaucoma; H/O thoracic aortic aneurysm repair; Hyperlipidemia; Hypertension; Lumbar stenosis; Osteoarthritis; Paroxysmal A-fib; Rheumatoid arthritis; Valvular heart disease;  has a past surgical history that includes Thoracic aortic aneurysm repair (2010); Aortic valve replacement (2007); Replacement total knee (Left, 2003); Cataract extraction w/ intraocular lens  implant, bilateral (Bilateral); Lumbar laminectomy/decompression microdiscectomy (Right, 03/08/2016)    PT Comments    Pt progressing with mobility. Wife unable to provide physical assist and uses a RW herself. Pt and wife would both like short term rehab to increase mobility and safety prior to returning home again.    Follow Up Recommendations  SNF;Supervision/Assistance - 24 hour;Other (comment)     Equipment Recommendations  None recommended by PT    Recommendations for Other Services       Precautions / Restrictions Precautions Precautions: Fall;Back Precaution Booklet Issued: Yes (comment) Required Braces or Orthoses: Other Brace/Splint Other Brace/Splint: "No Brace Needed" per orders; however, lumbar corset in room was used during session Restrictions Weight Bearing Restrictions: No    Mobility  Bed Mobility Overal bed mobility: Needs Assistance Bed Mobility: Rolling;Supine to Sit;Sit to Sidelying Rolling: Supervision   Supine to sit: Supervision   Sit to sidelying: Supervision General bed mobility comments: Pt needed rails to assist with bed mobility.Required VCs as he did not remember to log roll when getting OOB.   Transfers Overall transfer level: Needs  assistance Equipment used: Rolling walker (2 wheeled) Transfers: Sit to/from Stand Sit to Stand: Min guard         General transfer comment: VCs for safest/easiest technique and hand placement  Ambulation/Gait Ambulation/Gait assistance: Min guard Ambulation Distance (Feet): 100 Feet Assistive device: Rolling walker (2 wheeled) Gait Pattern/deviations: Step-through pattern;Decreased stride length;Trunk flexed Gait velocity: decreased Gait velocity interpretation: <1.8 ft/sec, indicative of risk for recurrent falls General Gait Details: VCs for upright posture. small step length with easch foot, and decreased foot clearance with lack off toe off.    Stairs            Wheelchair Mobility    Modified Rankin (Stroke Patients Only)       Balance                                            Cognition Arousal/Alertness: Awake/alert Behavior During Therapy: WFL for tasks assessed/performed Overall Cognitive Status: Within Functional Limits for tasks assessed                                        Exercises      General Comments General comments (skin integrity, edema, etc.): wife present throughout session.       Pertinent Vitals/Pain Pain Assessment: 0-10 Pain Score: 5  Pain Location: back Pain Descriptors / Indicators: Sore Pain Intervention(s): Limited activity within patient's tolerance;Monitored during session;Premedicated before session    Home Living                      Prior Function  PT Goals (current goals can now be found in the care plan section) Progress towards PT goals: Progressing toward goals    Frequency    Min 5X/week      PT Plan Current plan remains appropriate    Co-evaluation              AM-PAC PT "6 Clicks" Daily Activity  Outcome Measure  Difficulty turning over in bed (including adjusting bedclothes, sheets and blankets)?: Total (requires rails) Difficulty  moving from lying on back to sitting on the side of the bed? : A Little Difficulty sitting down on and standing up from a chair with arms (e.g., wheelchair, bedside commode, etc,.)?: Total Help needed moving to and from a bed to chair (including a wheelchair)?: A Little Help needed walking in hospital room?: A Little Help needed climbing 3-5 steps with a railing? : A Lot 6 Click Score: 13    End of Session Equipment Utilized During Treatment: Gait belt;Back brace Activity Tolerance: Patient tolerated treatment well Patient left: in bed;with call bell/phone within reach;with bed alarm set;with family/visitor present   PT Visit Diagnosis: Other abnormalities of gait and mobility (R26.89)     Time: 1103-1594 PT Time Calculation (min) (ACUTE ONLY): 25 min  Charges:  $Gait Training: 23-37 mins                    G CodesLavonia Dana, PT  [phone] 10/17/2016    [name] 10/17/2016, 4:20 PM

## 2016-10-17 NOTE — Progress Notes (Signed)
hemovac removed aseptically, new dressing placed-honeycomb occusively.

## 2016-10-17 NOTE — Progress Notes (Signed)
10/17/2016 6:53 PM  [facility] Progress Note  Pt continuing to improve.   Medical problems stable.  For SNF placement.   Vitals:   10/17/16 0516 10/17/16 0932  BP: (!) 157/80 (!) 163/72  Pulse: 74 84  Resp: 20 20  Temp: 98.4 F (36.9 C) 98.4 F (36.9 C)     #1. Nausea and vomiting. Resolved now. Patient with a history of postanesthesia nausea vomiting per family at bedside. Currently afebrile hemodynamically stable and not hypoxic.  -advanced to regular diet -scheduled zofran -discontinue phenergan -urinalysis with WBC, urine culture pending. Started on bactrim DS  #2. Hypertension. Fair control. Home medications include lisinopril and Norvasc. -Monitor -Resumed lisinopril 5/26 -Continue Norvasc   #3. CAD. No chest pain. Chart review indicates "mild to moderate nonobstructive" echo last year reveals EF 55 %, grade 2 diastolic dysfunction.  -Continue aspirin -Continue statin and lisinopril  #4. Chronic kidney disease stage III. Chart review indicates as of August 2017 baseline creatinine 1.6-1.7 -Labs stable -DC IV fluids -Good urine output  #5. Anemia. Patient with a history of chronic iron deficiency anemia. Chart review indicates he was seen in March 2018 by hematology for evaluation of anemia and asymmetric left rectal wall thickening on CT. He had an EGD/colonoscopy in May 2018. Colonoscopy showed nonbleeding internal hemorrhoids diverticulosis polyp status post removal. EGD showed esophagitis. Note indicates in addition mild chronic gastritis/negative for H pylori area dysplasia or malignancy. Hemoglobin 9.7 days ago.. -Continue outpatient follow-up with hematology  #6. Rheumatoid arthritis.stable at baseline -continue home med  #7. Spinal stenosis lumbar region. S/p surgery 10/14/16.  -pain control -mobilize patient  [facility] will sign off at this time, please call if we can be of further assistance.   Murvin Natal MD

## 2016-10-17 NOTE — Progress Notes (Signed)
Patient continues to c/o of pain, refusing to get OOB much of the time--wanted condom cath applied during night for convenience.  No nausea/vomiting.  C/o pain in legs/feet--has refused gabapentin HS for two nights, educated and gave late dose.  Continue to encourage/educate patient with mobility, IS, medications.

## 2016-10-17 NOTE — Progress Notes (Signed)
Occupational Therapy Treatment Patient Details Name: Bryan [name] MRN: [id number] DOB: Apr 06, 1941 Today's Date: 10/17/2016    History of present illness 76 year old male s/p Posterior Lumbar Interbody Fusion - L5-S1. Pt has a past medical history including Anemia; Arrhythmia; Chronic lower back pain; Coronary artery disease; GERD ; Glaucoma; H/O thoracic aortic aneurysm repair; Hyperlipidemia; Hypertension; Lumbar stenosis; Osteoarthritis; Paroxysmal A-fib; Rheumatoid arthritis; Valvular heart disease;  has a past surgical history that includes Thoracic aortic aneurysm repair (2010); Aortic valve replacement (2007); Replacement total knee (Left, 2003); Cataract extraction w/ intraocular lens  implant, bilateral (Bilateral); Lumbar laminectomy/decompression microdiscectomy (Right, 03/08/2016)   OT comments  Pt progressing toward OT goals. He was able to stand at sink for oral care tasks with min guard assist and VC's for adhering to back precautions. Pt able to complete toilet transfers with fluctuating min guard-min assist this session. Educated pt and wife on use of AE for LB dressing tasks and pt able to complete with min assist. Pt continues to require significant assistance with toilet hygiene and continues to require hands on assistance with ADL. Pt's wife reports concern over her ability to provide the necessary assistance to ensure both of their safety. Continue to recommend SNF level rehabilitation post-acute D/C. Will continue to follow acutely.    Follow Up Recommendations  SNF;Supervision/Assistance - 24 hour    Equipment Recommendations  3 in 1 bedside commode    Recommendations for Other Services      Precautions / Restrictions Precautions Precautions: Fall;Back Precaution Booklet Issued: Yes (comment) Precaution Comments: Able to recall 3/3 back precautions Required Braces or Orthoses: Other Brace/Splint Other Brace/Splint: "No Brace Needed" per orders; however, lumbar  corset in room was used during session Restrictions Weight Bearing Restrictions: No       Mobility Bed Mobility Overal bed mobility: Needs Assistance Bed Mobility: Rolling;Supine to Sit;Sit to Sidelying Rolling: Supervision   Supine to sit: Supervision   Sit to sidelying: Supervision General bed mobility comments: Sitting at EOB with RN in room on arrival.  Transfers Overall transfer level: Needs assistance Equipment used: Rolling walker (2 wheeled) Transfers: Sit to/from Stand Sit to Stand: Min assist (from lower surface)         General transfer comment: Min assist for lifting and steadying from bed at lowest height. Min guard from elevated surface.     Balance Overall balance assessment: Needs assistance Sitting-balance support: Feet supported;No upper extremity supported Sitting balance-Leahy Scale: Fair     Standing balance support: During functional activity;Single extremity supported;Bilateral upper extremity supported Standing balance-Leahy Scale: Poor Standing balance comment: pt reliant on at least one UE with ADL activity at sink                           ADL either performed or assessed with clinical judgement   ADL Overall ADL's : Needs assistance/impaired     Grooming: Oral care;Min guard;Standing               Lower Body Dressing: Minimal assistance;Sit to/from stand;With adaptive equipment   Toilet Transfer: Minimal assistance;Ambulation;RW;Min guard (fluctuating)   Toileting- Clothing Manipulation and Hygiene: Moderate assistance;Sit to/from stand       Functional mobility during ADLs: Minimal assistance;Rolling walker General ADL Comments: Educated pt on use of AE for LB ADL. Discussed D/C plan with pt and wife and wife reports concern over her ability to assist pt at his current functional level. Pt will need hands on  assistance if to go home.      Vision   Vision Assessment?: No apparent visual deficits   Perception      Praxis      Cognition Arousal/Alertness: Awake/alert Behavior During Therapy: WFL for tasks assessed/performed Overall Cognitive Status: Within Functional Limits for tasks assessed                                          Exercises     Shoulder Instructions       General Comments Wife present for session    Pertinent Vitals/ Pain       Pain Assessment: Faces  Faces Pain Scale: Hurts little more Pain Location: back Pain Descriptors / Indicators: Sore Pain Intervention(s): Monitored during session;Repositioned  Home Living                                          Prior Functioning/Environment              Frequency  Min 2X/week        Progress Toward Goals  OT Goals(current goals can now be found in the care plan section)  Progress towards OT goals: Progressing toward goals  Acute Rehab OT Goals Patient Stated Goal: to be able to move again by myself OT Goal Formulation: With patient Time For Goal Achievement: 10/23/16 Potential to Achieve Goals: Good ADL Goals Pt Will Perform Grooming: with set-up;with supervision;standing (at sink, 2 tasks) Pt Will Perform Lower Body Dressing: with min assist;with adaptive equipment;sit to/from stand Pt Will Transfer to Toilet: with supervision;ambulating;bedside commode (over toilet) Pt Will Perform Toileting - Clothing Manipulation and hygiene: with supervision;sit to/from stand Pt Will Perform Tub/Shower Transfer: Shower transfer;with supervision;ambulating;shower seat;3 in 1;rolling walker Additional ADL Goal #1: Pt will be able to state back precautions Additional ADL Goal #2: Pt will be S in and OOB for basic ADLs with HOB flat and no rail  Plan Discharge plan remains appropriate    Co-evaluation                 AM-PAC PT "6 Clicks" Daily Activity     Outcome Measure   Help from another person eating meals?: None Help from another person taking care of personal  grooming?: A Little Help from another person toileting, which includes using toliet, bedpan, or urinal?: A Lot Help from another person bathing (including washing, rinsing, drying)?: A Lot Help from another person to put on and taking off regular upper body clothing?: A Little Help from another person to put on and taking off regular lower body clothing?: A Little 6 Click Score: 17    End of Session Equipment Utilized During Treatment: Gait belt;Rolling walker;Back brace  OT Visit Diagnosis: Unsteadiness on feet (R26.81);Pain Pain - part of body:  (back)   Activity Tolerance Patient tolerated treatment well   Patient Left in chair;with call bell/phone within reach;with chair alarm set   Nurse Communication Mobility status        Time: 6503-5465 OT Time Calculation (min): 26 min  Charges: OT General Charges $OT Visit: 1 Procedure OT Treatments $Self Care/Home Management : 23-37 mins  Norman Herrlich, MS OTR/L  Pager: [place] A Caleen Taaffe 10/17/2016, 4:29 PM

## 2016-10-17 NOTE — Progress Notes (Addendum)
Pt seen and examined.  No issues overnight. Pain fairly well controlled Denies N/V  EXAM: Temp:  [98.4 F (36.9 C)-98.7 F (37.1 C)] 98.4 F (36.9 C) (05/27 0932) Pulse Rate:  [72-84] 84 (05/27 0932) Resp:  [18-20] 20 (05/27 0932) BP: (150-163)/(57-80) 163/72 (05/27 0932) SpO2:  [94 %-97 %] 95 % (05/27 0932) Intake/Output      05/26 0701 - 05/27 0700 05/27 0701 - 05/28 0700   P.O. 240    I.V. (mL/kg)     IV Piggyback 300    Total Intake(mL/kg) 540 (6.5)    Urine (mL/kg/hr) 1200 (0.6)    Drains 15 (0)    Total Output 1215     Net -675          Urine Occurrence 1 x     Awake and alert Follows commands throughout [facility] Wound c/d/i  Stable. Continue current care Encouraged to work with PT/OT. Await their recs for placement Nursing reports <63mL out of Hemovac x2 shifts. Okay to remove.

## 2016-10-18 DIAGNOSIS — Z4789 Encounter for other orthopedic aftercare: Secondary | ICD-10-CM | POA: Diagnosis not present

## 2016-10-18 DIAGNOSIS — Z96652 Presence of left artificial knee joint: Secondary | ICD-10-CM | POA: Diagnosis not present

## 2016-10-18 DIAGNOSIS — B49 Unspecified mycosis: Secondary | ICD-10-CM | POA: Diagnosis not present

## 2016-10-18 DIAGNOSIS — G4700 Insomnia, unspecified: Secondary | ICD-10-CM | POA: Diagnosis not present

## 2016-10-18 DIAGNOSIS — M5137 Other intervertebral disc degeneration, lumbosacral region: Secondary | ICD-10-CM | POA: Diagnosis not present

## 2016-10-18 DIAGNOSIS — K219 Gastro-esophageal reflux disease without esophagitis: Secondary | ICD-10-CM | POA: Diagnosis not present

## 2016-10-18 DIAGNOSIS — I2510 Atherosclerotic heart disease of native coronary artery without angina pectoris: Secondary | ICD-10-CM | POA: Diagnosis not present

## 2016-10-18 DIAGNOSIS — I129 Hypertensive chronic kidney disease with stage 1 through stage 4 chronic kidney disease, or unspecified chronic kidney disease: Secondary | ICD-10-CM | POA: Diagnosis not present

## 2016-10-18 DIAGNOSIS — E785 Hyperlipidemia, unspecified: Secondary | ICD-10-CM | POA: Diagnosis not present

## 2016-10-18 DIAGNOSIS — I6529 Occlusion and stenosis of unspecified carotid artery: Secondary | ICD-10-CM | POA: Diagnosis not present

## 2016-10-18 DIAGNOSIS — D649 Anemia, unspecified: Secondary | ICD-10-CM | POA: Diagnosis not present

## 2016-10-18 DIAGNOSIS — H401190 Primary open-angle glaucoma, unspecified eye, stage unspecified: Secondary | ICD-10-CM | POA: Diagnosis not present

## 2016-10-18 DIAGNOSIS — Z981 Arthrodesis status: Secondary | ICD-10-CM | POA: Diagnosis not present

## 2016-10-18 DIAGNOSIS — K5903 Drug induced constipation: Secondary | ICD-10-CM | POA: Diagnosis not present

## 2016-10-18 DIAGNOSIS — F329 Major depressive disorder, single episode, unspecified: Secondary | ICD-10-CM | POA: Diagnosis not present

## 2016-10-18 DIAGNOSIS — M069 Rheumatoid arthritis, unspecified: Secondary | ICD-10-CM | POA: Diagnosis not present

## 2016-10-18 DIAGNOSIS — M48062 Spinal stenosis, lumbar region with neurogenic claudication: Secondary | ICD-10-CM | POA: Diagnosis not present

## 2016-10-18 DIAGNOSIS — I10 Essential (primary) hypertension: Secondary | ICD-10-CM | POA: Diagnosis not present

## 2016-10-18 DIAGNOSIS — M0579 Rheumatoid arthritis with rheumatoid factor of multiple sites without organ or systems involvement: Secondary | ICD-10-CM | POA: Diagnosis not present

## 2016-10-18 DIAGNOSIS — N189 Chronic kidney disease, unspecified: Secondary | ICD-10-CM | POA: Diagnosis not present

## 2016-10-18 DIAGNOSIS — R52 Pain, unspecified: Secondary | ICD-10-CM | POA: Diagnosis not present

## 2016-10-18 DIAGNOSIS — R338 Other retention of urine: Secondary | ICD-10-CM | POA: Diagnosis not present

## 2016-10-18 DIAGNOSIS — M1990 Unspecified osteoarthritis, unspecified site: Secondary | ICD-10-CM | POA: Diagnosis not present

## 2016-10-18 DIAGNOSIS — N403 Nodular prostate with lower urinary tract symptoms: Secondary | ICD-10-CM | POA: Diagnosis not present

## 2016-10-18 DIAGNOSIS — Z87891 Personal history of nicotine dependence: Secondary | ICD-10-CM | POA: Diagnosis not present

## 2016-10-18 DIAGNOSIS — Z952 Presence of prosthetic heart valve: Secondary | ICD-10-CM | POA: Diagnosis not present

## 2016-10-18 LAB — BASIC METABOLIC PANEL
Anion gap: 7 (ref 5–15)
BUN: 15 mg/dL (ref 6–20)
CO2: 29 mmol/L (ref 22–32)
Calcium: 8.7 mg/dL — ABNORMAL LOW (ref 8.9–10.3)
Chloride: 94 mmol/L — ABNORMAL LOW (ref 101–111)
Creatinine, Ser: 0.99 mg/dL (ref 0.61–1.24)
GFR calc Af Amer: >60 mL/min (ref >60)
GFR calc non Af Amer: >60 mL/min (ref >60)
Glucose, Bld: 99 mg/dL (ref 65–99)
Potassium: 3.7 mmol/L (ref 3.5–5.1)
Sodium: 130 mmol/L — ABNORMAL LOW (ref 135–145)

## 2016-10-18 LAB — CBC
HCT: 23.5 % — ABNORMAL LOW (ref 39.0–52.0)
Hemoglobin: 7.8 g/dL — ABNORMAL LOW (ref 13.0–17.0)
MCH: 29.8 pg (ref 26.0–34.0)
MCHC: 33.2 g/dL (ref 30.0–36.0)
MCV: 89.7 fL (ref 78.0–100.0)
Platelets: 108 10*3/uL — ABNORMAL LOW (ref 150–400)
RBC: 2.62 MIL/uL — ABNORMAL LOW (ref 4.22–5.81)
RDW: 13.1 % (ref 11.5–15.5)
WBC: 5.1 10*3/uL (ref 4.0–10.5)

## 2016-10-18 MED ORDER — SULFAMETHOXAZOLE-TRIMETHOPRIM 800-160 MG PO TABS: 1.0000 | ORAL_TABLET | Freq: Two times a day (BID) | ORAL | 0 refills | Status: AC

## 2016-10-18 MED ORDER — PANTOPRAZOLE SODIUM 40 MG PO TBEC: 40.0000 mg | DELAYED_RELEASE_TABLET | Freq: Every day | ORAL | Status: DC

## 2016-10-18 MED ORDER — FENTANYL 25 MCG/HR TD PT72: 25.0000 ug | MEDICATED_PATCH | TRANSDERMAL | 0 refills | Status: DC

## 2016-10-18 MED ORDER — HYDROCODONE-ACETAMINOPHEN 5-325 MG PO TABS: ORAL_TABLET | ORAL | 0 refills | Status: DC

## 2016-10-18 NOTE — Progress Notes (Signed)
Report called to [place] at [facility]. Pt. To be transported by family.

## 2016-10-18 NOTE — NC FL2 (Signed)
[place] MEDICAID FL2 LEVEL OF CARE SCREENING TOOL     IDENTIFICATION  Patient Name: Bryan [name] Birthdate: 23-Mar-1941 Sex: male Admission Date (Current Location): 10/14/2016  [facility] and [place] Number:  Herbalist and Address:  [facility] [employer], [place] [address] [place], [place] [address]      Provider Number: [id number]  Attending Physician Name and Address:  Kary Kos, MD  Relative Name and Phone Number:       Current Level of Care: Hospital Recommended Level of Care: [place] Prior Approval Number:    Date Approved/Denied:   PASRR Number: [id number]  Discharge Plan: SNF    Current Diagnoses: Patient Active Problem List   Diagnosis Date Noted  . Nausea and vomiting 10/15/2016  . Spinal stenosis of lumbar region 10/14/2016  . Leukopenia 09/27/2016  . Thrombocytopenia ([place]) 09/27/2016  . Other pancytopenia ([place]) 09/27/2016  . History of left knee replacement 09/01/2016  . Fall 09/01/2016  . HNP (herniated nucleus pulposus with myelopathy), thoracic 03/08/2016  . Rectal nodule 02/23/2016  . DDD (degenerative disc disease), lumbar 01/02/2016  . Lumbar radiculopathy 01/02/2016  . Facet syndrome, lumbar ([place]) 01/02/2016  . Greater trochanteric bursitis 11/28/2015  . Acute renal failure ([place]) 11/06/2015  . Dehydration 10/17/2015  . IBS (irritable bowel syndrome) 07/15/2015  . Erectile dysfunction of organic origin 07/15/2015  . Carotid stenosis 05/06/2015  . Difficulty sleeping 04/15/2015  . 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 Chronic kidney disease 02/17/2015  . 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  . Enlarged prostate with lower urinary tract symptoms (LUTS) 02/17/2015  . Rheumatoid arthritis ([place]) 10/01/2014  . DJD (degenerative joint disease), cervical 10/01/2014  . DJD (degenerative joint disease), lumbar 10/01/2014  . Sacroiliac joint disease 10/01/2014  .  Fusion of spine, sacral and sacrococcygeal region 10/01/2014  . DDD (degenerative disc disease), cervical 10/01/2014  . Lumbar and sacral osteoarthritis 10/01/2014  . Mechanical and motor problems with internal organs 06/20/2014  . Iron deficiency anemia 06/19/2014  . Anemia 06/19/2014  . Aneurysm, ascending aorta ([place]) 05/27/2014  . Aneurysm of thoracic aorta ([place]) 05/27/2014  . Thoracic aortic aneurysm without rupture ([place]) 05/27/2014  . Cognitive decline 04/01/2014  . Spinal stenosis of lumbar region with radiculopathy 01/30/2014  . Vitamin D deficiency 01/30/2014  . HLD (hyperlipidemia) 01/30/2014  . Dorsalgia, unspecified 01/30/2014  . Lumbar canal stenosis 01/30/2014  . Atrial fibrillation ([place]) 11/27/2013  . S/P AVR (aortic valve replacement) 11/27/2013  . History of ascending aorta repair 11/27/2013  . Hyperlipidemia 11/27/2013  . Essential hypertension 11/27/2013  . Bicuspid aortic valve 11/27/2013  . Atherosclerotic heart disease of native coronary artery without angina pectoris 11/27/2013  . 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 History of open heart surgery 11/27/2013  . Postprocedural state 11/27/2013  . History of cardiovascular surgery 11/27/2013  . Carotid artery insufficiency syndrome 11/06/2010    Orientation RESPIRATION BLADDER Height & Weight     Self, Time, Situation, Place  Normal Incontinent (incontinent sometimes) Weight: 183 lb (83 kg) Height:  5\' 11"  (180.3 cm)  BEHAVIORAL SYMPTOMS/MOOD NEUROLOGICAL BOWEL NUTRITION STATUS      Continent    AMBULATORY STATUS COMMUNICATION OF NEEDS Skin   Limited Assist Verbally Surgical wounds                       Personal Care Assistance Level of Assistance  Bathing, Dressing Bathing Assistance: Limited assistance  Dressing Assistance: Limited assistance     Functional Limitations Info             SPECIAL CARE FACTORS FREQUENCY  PT (By licensed PT), OT (By licensed OT)     PT  Frequency: 5x/wk OT Frequency: 5x/wk            Contractures      Additional Factors Info  Code Status, Allergies, Psychotropic Code Status Info: Full Allergies Info: Penicillins, Demerol Meperidine Psychotropic Info: Prozac, 20mg          Current Medications (10/18/2016):  This is the current hospital active medication list Current Facility-Administered Medications  Medication Dose Route Frequency Provider Last Rate Last Dose  . 0.9 %  sodium chloride infusion  250 mL Intravenous Continuous Kary Kos, MD 1 mL/hr at 10/14/16 1801 250 mL at 10/14/16 1801  . acetaminophen (TYLENOL) tablet 650 mg  650 mg Oral Q4H PRN Kary Kos, MD   650 mg at 10/15/16 1738   Or  . acetaminophen (TYLENOL) suppository 650 mg  650 mg Rectal Q4H PRN Kary Kos, MD      . alum & mag hydroxide-simeth (MAALOX/MYLANTA) 200-200-20 MG/5ML suspension 30 mL  30 mL Oral Q6H PRN Kary Kos, MD      . amLODipine (NORVASC) tablet 5 mg  5 mg Oral Daily Kary Kos, MD   5 mg at 10/18/16 0913  . aspirin EC tablet 81 mg  81 mg Oral Daily Kary Kos, MD   81 mg at 10/18/16 0911  . atorvastatin (LIPITOR) tablet 20 mg  20 mg Oral q1800 Johnson, Clanford L, MD   20 mg at 10/17/16 1723  . B-complex with vitamin C tablet 1 tablet  1 tablet Oral Daily Kary Kos, MD   1 tablet at 10/18/16 0912  . brimonidine (ALPHAGAN) 0.2 % ophthalmic solution 1 drop  1 drop Both Eyes q morning - 10a Kary Kos, MD   1 drop at 10/18/16 0913  . cholecalciferol (VITAMIN D) tablet 400 Units  400 Units Oral Daily Kary Kos, MD   400 Units at 10/18/16 0911  . fenofibrate tablet 160 mg  160 mg Oral Daily Kary Kos, MD   160 mg at 10/18/16 0912  . fentaNYL (DURAGESIC - dosed mcg/hr) patch 25 mcg  25 mcg Transdermal Q72H Kary Kos, MD   25 mcg at 10/17/16 0523  . ferrous sulfate tablet 325 mg  325 mg Oral Q breakfast Kary Kos, MD   325 mg at 10/18/16 0916  . fluconazole (DIFLUCAN) tablet 200 mg  200 mg Oral Daily Kary Kos, MD   200 mg at  10/18/16 0912  . FLUoxetine (PROZAC) capsule 20 mg  20 mg Oral Daily Kary Kos, MD   20 mg at 10/18/16 0912  . folic acid (FOLVITE) tablet 1 mg  1 mg Oral BID Kary Kos, MD   1 mg at 10/18/16 0913  . gabapentin (NEURONTIN) capsule 800 mg  800 mg Oral BID Dyanne Carrel M, NP   800 mg at 10/18/16 8299  . hydrALAZINE (APRESOLINE) tablet 25 mg  25 mg Oral TID Kary Kos, MD   25 mg at 10/18/16 0913  . HYDROcodone-acetaminophen (NORCO/VICODIN) 5-325 MG per tablet 1 tablet  1 tablet Oral Q4H PRN Radene Gunning, NP   1 tablet at 10/18/16 0925  . HYDROmorphone (DILAUDID) injection 0.5 mg  0.5 mg Intravenous Q3H PRN Radene Gunning, NP   0.5 mg at 10/16/16 2317  . lactated ringers infusion 1,000 mL  1,000  mL Intravenous Continuous Kary Kos, MD      . lisinopril (PRINIVIL,ZESTRIL) tablet 20 mg  20 mg Oral QPM Johnson, Clanford L, MD   20 mg at 10/17/16 1723  . loratadine (CLARITIN) tablet 10 mg  10 mg Oral Daily Kary Kos, MD   10 mg at 10/18/16 0913  . magnesium oxide (MAG-OX) tablet 400 mg  400 mg Oral Daily Kary Kos, MD   400 mg at 10/18/16 0912  . menthol-cetylpyridinium (CEPACOL) lozenge 3 mg  1 lozenge Oral PRN Kary Kos, MD       Or  . phenol (CHLORASEPTIC) mouth spray 1 spray  1 spray Mouth/Throat PRN Kary Kos, MD      . pantoprazole (PROTONIX) EC tablet 40 mg  40 mg Oral QHS Kary Kos, MD      . promethazine (PHENERGAN) injection 12.5 mg  12.5 mg Intravenous Q6H PRN Kary Kos, MD   12.5 mg at 10/15/16 0824  . sodium chloride flush (NS) 0.9 % injection 3 mL  3 mL Intravenous Q12H Kary Kos, MD   3 mL at 10/18/16 0914  . sodium chloride flush (NS) 0.9 % injection 3 mL  3 mL Intravenous PRN Kary Kos, MD      . sulfamethoxazole-trimethoprim (BACTRIM DS,SEPTRA DS) 800-160 MG per tablet 1 tablet  1 tablet Oral Q12H Johnson, Clanford L, MD   1 tablet at 10/18/16 0912  . tamsulosin (FLOMAX) capsule 0.4 mg  0.4 mg Oral Daily Kary Kos, MD   0.4 mg at 10/18/16 0912  . traZODone (DESYREL)  tablet 100 mg  100 mg Oral QHS PRN Kary Kos, MD      . vitamin B-12 (CYANOCOBALAMIN) tablet 1,000 mcg  1,000 mcg Oral Daily Kary Kos, MD   1,000 mcg at 10/18/16 0912  . vitamin C (ASCORBIC ACID) tablet 500 mg  500 mg Oral Daily Kary Kos, MD   500 mg at 10/18/16 8616     Discharge Medications: Please see discharge summary for a list of discharge medications.  Relevant Imaging Results:  Relevant Lab Results:   Additional Information SS#: [id number]  Geralynn Ochs, LCSW

## 2016-10-18 NOTE — Care Management Note (Signed)
Case Management Note  Patient Details  Name: Bryan [name] MRN: [id number] Date of Birth: 06/14/1940  Subjective/Objective:                    Action/Plan: Pt discharged to [facility]. No further needs per CM.   Expected Discharge Date:  10/18/16               Expected Discharge Plan:  Skilled Nursing Facility  In-House Referral:  Clinical Social Work  Discharge planning Services     Post Acute Care Choice:    Choice offered to:     DME Arranged:    DME Agency:     HH Arranged:    Day Agency:     Status of Service:  Completed, signed off  If discussed at [employer], dates discussed:    Additional Comments:  Pollie Friar, RN 10/18/2016, 12:44 PM

## 2016-10-18 NOTE — Progress Notes (Signed)
Physical Therapy Treatment Patient Details Name: Bryan [name] MRN: [id number] DOB: Sep 29, 1940 Today's Date: 10/18/2016    History of Present Illness 76 year old male s/p Posterior Lumbar Interbody Fusion - L5-S1. Pt has a past medical history including Anemia; Arrhythmia; Chronic lower back pain; Coronary artery disease; GERD ; Glaucoma; H/O thoracic aortic aneurysm repair; Hyperlipidemia; Hypertension; Lumbar stenosis; Osteoarthritis; Paroxysmal A-fib; Rheumatoid arthritis; Valvular heart disease;  has a past surgical history that includes Thoracic aortic aneurysm repair (2010); Aortic valve replacement (2007); Replacement total knee (Left, 2003); Cataract extraction w/ intraocular lens  implant, bilateral (Bilateral); Lumbar laminectomy/decompression microdiscectomy (Right, 03/08/2016)    PT Comments    Pt improving slowly, emphasis on improving safety.   Follow Up Recommendations  SNF;Supervision/Assistance - 24 hour;Other (comment)     Equipment Recommendations  None recommended by PT    Recommendations for Other Services       Precautions / Restrictions Precautions Precautions: Fall;Back Other Brace/Splint: "No Brace Needed" per orders; however, lumbar corset in room was used during session Restrictions Weight Bearing Restrictions: No    Mobility  Bed Mobility Overal bed mobility: Needs Assistance Bed Mobility: Rolling;Sidelying to Sit;Sit to Sidelying Rolling: Supervision Sidelying to sit: Min guard     Sit to sidelying: Min guard General bed mobility comments: cues for improving sequence for lying back onto side.  Transfers Overall transfer level: Needs assistance Equipment used: Rolling walker (2 wheeled) Transfers: Sit to/from Stand Sit to Stand: Min assist            Ambulation/Gait Ambulation/Gait assistance: Min guard Ambulation Distance (Feet): 110 Feet Assistive device: Rolling walker (2 wheeled) Gait Pattern/deviations: Step-through  pattern Gait velocity: decreased Gait velocity interpretation: Below normal speed for age/gender General Gait Details: short, low amplitude steps with minimally flexed knees overall.  Notably fatiguing with distance    Stairs            Wheelchair Mobility    Modified Rankin (Stroke Patients Only)       Balance     Sitting balance-Leahy Scale: Fair       Standing balance-Leahy Scale: Poor                              Cognition Arousal/Alertness: Awake/alert Behavior During Therapy: WFL for tasks assessed/performed Overall Cognitive Status: Within Functional Limits for tasks assessed                                        Exercises      General Comments General comments (skin integrity, edema, etc.): education emphasis on safety with transitions side to/from sit and sit to stand      Pertinent Vitals/Pain Pain Assessment: 0-10 Pain Score: 6  Pain Location: back Pain Descriptors / Indicators: Sore Pain Intervention(s): Monitored during session    Home Living                      Prior Function            PT Goals (current goals can now be found in the care plan section) Acute Rehab PT Goals Patient Stated Goal: to be able to move again by myself PT Goal Formulation: With patient/family Time For Goal Achievement: 10/29/16 Potential to Achieve Goals: Good    Frequency    Min 5X/week  PT Plan Current plan remains appropriate    Co-evaluation              AM-PAC PT "6 Clicks" Daily Activity  Outcome Measure  Difficulty turning over in bed (including adjusting bedclothes, sheets and blankets)?: A Little Difficulty moving from lying on back to sitting on the side of the bed? : A Little Difficulty sitting down on and standing up from a chair with arms (e.g., wheelchair, bedside commode, etc,.)?: Total Help needed moving to and from a bed to chair (including a wheelchair)?: A Little Help needed  walking in hospital room?: A Little Help needed climbing 3-5 steps with a railing? : A Little 6 Click Score: 16    End of Session Equipment Utilized During Treatment: Back brace Activity Tolerance: Patient tolerated treatment well Patient left: in bed;with call bell/phone within reach;with bed alarm set;with family/visitor present Nurse Communication: Mobility status PT Visit Diagnosis: Other abnormalities of gait and mobility (R26.89)     Time: 1010-1030 PT Time Calculation (min) (ACUTE ONLY): 20 min  Charges:  $Gait Training: 8-22 mins                    G Codes:       2016/10/26  Donnella Sham, PT [phone]  (pager)   Tessie Fass Jshawn Hurta 2016-10-26, 10:46 AM

## 2016-10-18 NOTE — Progress Notes (Signed)
Discharge to: [place] Anticipated discharge date: 10/18/16 Family notified: Yes, by phone at bedside Transportation by: Family car  [place] signing off.  Report #: [place] LCSW [phone]

## 2016-10-18 NOTE — Discharge Summary (Signed)
Physician Discharge Summary  Patient ID: Bryan [name] MRN: [id number] DOB/AGE: 07-30-1940 76 y.o.  Admit date: 10/14/2016 Discharge date: 10/18/2016  Admission Diagnoses:  Spinal stenosis of lumbar region   Discharge Diagnoses:  Same Principal Problem:   Nausea and vomiting Active Problems:   Essential hypertension   Atherosclerotic heart disease of native coronary artery without angina pectoris   HLD (hyperlipidemia)   Chronic kidney disease   Anemia   Enlarged prostate with lower urinary tract symptoms (LUTS)   Carotid stenosis   DDD (degenerative disc disease), lumbar   Spinal stenosis of lumbar region   Discharged Condition: Stable  Hospital Course:  Bryan [name] is a 76 y.o. male who was admitted for the below procedure. Hospitalist service was consulted for management of medical issues. Appreciative of their assistance. At time of discharge, pain was well controlled, ambulating with Pt/OT, tolerating po, voiding normal. Ready for discharge. PT/OT rec SNF. Agree with this.  Treatments: Surgery - Redo decompressive lumbar laminotomy L5-S1 with radical foraminotomy the L5 and S1 nerve root with complete medial facetectomies #2 posterior lumbar interbody fusion L5-S1 utilizing the globus peek cages packed with locally harvested autograft mixed with BMP and vivigen #3 pedicle screw fixation L5-S1 using the globus Creo modular pedicle screw set #4 posterior lateral arthrodesis L4-5 utilizing the autograft mix  Discharge Exam: Blood pressure 137/60, pulse 76, temperature 98.5 F (36.9 C), temperature source Oral, resp. rate 19, height 5\' 11"  (1.803 m), weight 83 kg (183 lb), SpO2 98 %. Awake, alert, oriented Speech fluent, appropriate CN grossly intact 5/5 BUE/BLE Wound c/d/i  Disposition: 01-Home or Self Care  Discharge Instructions    Call MD for:  difficulty breathing, headache or visual disturbances    Complete by:  As directed    Call MD for:   persistant dizziness or light-headedness    Complete by:  As directed    Call MD for:  redness, tenderness, or signs of infection (pain, swelling, redness, odor or green/yellow discharge around incision site)    Complete by:  As directed    Call MD for:  severe uncontrolled pain    Complete by:  As directed    Call MD for:  temperature >100.4    Complete by:  As directed    Diet general    Complete by:  As directed    Driving Restrictions    Complete by:  As directed    Do not drive until given clearance.   Increase activity slowly    Complete by:  As directed    Lifting restrictions    Complete by:  As directed    Do not lift anything >10lbs. Avoid bending and twisting in awkward positions. Avoid bending at the back.   May shower / Bathe    Complete by:  As directed    In 24 hours. Okay to wash wound with warm soapy water. Avoid scrubbing the wound. Pat dry.   Remove dressing in 24 hours    Complete by:  As directed      Allergies as of 10/18/2016      Reactions   Penicillins Hives, Swelling   SWELLING REACTION UNSPECIFIED PATIENT HAS TAKEN AMOXICILLIN ON MED HX FROM [facility] Has patient had a PCN reaction causing immediate rash, facial/tongue/throat swelling, SOB or lightheadedness with hypotension: Yes Has patient had a PCN reaction causing severe rash involving mucus membranes or skin necrosis: No Has patient had a PCN reaction that required hospitalization No Has patient had a  PCN reaction occurring within the last 10 years: No If all of the above answers are "NO", then may proceed with   Demerol [meperidine] Hives, Nausea And Vomiting      Medication List    TAKE these medications   amLODipine 5 MG tablet Commonly known as:  NORVASC Take 1 tablet (5 mg total) by mouth daily.   amoxicillin 500 MG capsule Commonly known as:  AMOXIL Take 2,000 mg by mouth as needed (prior to dental procedures). Reported on 11/17/2015   aspirin EC 81 MG tablet Take 81 mg by mouth  daily.   b complex vitamins tablet Take 1 tablet by mouth daily.   brimonidine 0.2 % ophthalmic solution Commonly known as:  ALPHAGAN Place 1 drop into both eyes every morning.   cetirizine 10 MG tablet Commonly known as:  ZYRTEC Take 10 mg by mouth daily.   cholecalciferol 400 units Tabs tablet Commonly known as:  VITAMIN D Take 400 Units by mouth daily.   fenofibrate 160 MG tablet Take 1 tablet (160 mg total) by mouth daily.   fentaNYL 25 MCG/HR patch Commonly known as:  DURAGESIC - dosed mcg/hr Apply one patch to skin every 3 days if tolerated What changed:  how much to take  how to take this  when to take this  additional instructions   fentaNYL 25 MCG/HR patch Commonly known as:  DURAGESIC - dosed mcg/hr Place 1 patch (25 mcg total) onto the skin every 3 (three) days. Start taking on:  10/20/2016 What changed:  You were already taking a medication with the same name, and this prescription was added. Make sure you understand how and when to take each.   ferrous sulfate 325 (65 FE) MG tablet Take 325 mg by mouth daily with breakfast.   fluconazole 200 MG tablet Commonly known as:  DIFLUCAN Take 1 tablet (200 mg total) by mouth daily.   FLUoxetine 20 MG capsule Commonly known as:  PROZAC TAKE 1 CAPSULE BY MOUTH EVERY DAY   folic acid 1 MG tablet Commonly known as:  FOLVITE Take 1 mg by mouth 2 (two) times daily.   gabapentin 800 MG tablet Commonly known as:  NEURONTIN Limit 1 tablet by mouth 2-3 times per day if tolerated What changed:  how much to take  how to take this  when to take this  additional instructions   hydrALAZINE 25 MG tablet Commonly known as:  APRESOLINE Take 1 tablet (25 mg total) by mouth 3 (three) times daily.   HYDROcodone-acetaminophen 5-325 MG tablet Commonly known as:  NORCO/VICODIN Take 1 tablet every 6 hours by mouth per day (max 4 tablets per day) for breakthrough pain while wearing fentanyl patch if tolerated What  changed:  additional instructions   hydrocortisone cream 1 % Apply 1 application topically daily as needed for itching.   lisinopril 20 MG tablet Commonly known as:  PRINIVIL,ZESTRIL Take 1 tablet (20 mg total) by mouth every evening.   Magnesium 400 MG Caps Take 400 mg by mouth daily.   omeprazole 40 MG capsule Commonly known as:  PRILOSEC Take 40 mg by mouth daily.   predniSONE 5 MG tablet Commonly known as:  DELTASONE Take 5 mg by mouth daily as needed. Reported on 11/17/2015   simvastatin 40 MG tablet Commonly known as:  ZOCOR Take 1 tablet (40 mg total) by mouth at bedtime.   sulfamethoxazole-trimethoprim 800-160 MG tablet Commonly known as:  BACTRIM DS,SEPTRA DS Take 1 tablet by mouth every 12 (twelve) hours.  tamsulosin 0.4 MG Caps capsule Commonly known as:  FLOMAX TAKE 1 CAPSULE BY MOUTH EVERY DAY   traZODone 50 MG tablet Commonly known as:  DESYREL Take 100 mg by mouth at bedtime as needed for sleep. Reported on 10/09/2015   vitamin B-12 1000 MCG tablet Commonly known as:  CYANOCOBALAMIN Take 1,000 mcg by mouth daily.   vitamin C 500 MG tablet Commonly known as:  ASCORBIC ACID Take 500 mg by mouth daily.            Durable Medical Equipment        Start     Ordered   10/18/16 3474  DME 3-in-1  Once     10/18/16 0853   10/17/16 1110  For home use only DME 3 n 1  Once     10/17/16 1110      Contact information for follow-up providers    Kary Kos, MD. Call in 1 day(s).   Specialty:  Neurosurgery Why:  To schedule your first post operative visit Contact information: [address] [place] [address] [phone]            Contact information for after-discharge care    [facility] .   Specialty:  [place] information: [place] [phone]                  Signed: Traci Sermon 10/18/2016, 12:11 PM

## 2016-10-18 NOTE — Clinical Social Work Note (Signed)
Clinical Social Work Assessment  Patient Details  Name: Bryan [name] MRN: [id number] Date of Birth: 1941/02/23  Date of referral:  10/18/16               Reason for consult:  Facility Placement                Permission sought to share information with:  Chartered certified accountant granted to share information::  Yes, Verbal Permission Granted  Name::        Agency::  [facility]  Relationship::     Contact Information:     Housing/Transportation Living arrangements for the past 2 months:  Single Family Home Source of Information:  Patient Patient Interpreter Needed:  None Criminal Activity/Legal Involvement Pertinent to Current Situation/Hospitalization:  No - Comment as needed Significant Relationships:  Spouse Lives with:  Self, Spouse Do you feel safe going back to the place where you live?  Yes Need for family participation in patient care:  No (Coment)  Care giving concerns:  Pt currently lives at home with spouse, and is concerned about his ability to complete ADLs. Pt reports that his wife also has trouble walking, so he doesn't want to return home until he's stronger and more able to independently care for his needs.   Social Worker assessment / plan:  CSW explained recommendation for SNF. CSW provided list of facilities. Pt discussed preference for [facility] in [place]. CSW confirmed with [facility] that they would be able to accept pt for same day discharge. Pt informed that he would be going to Room 412 in [facility].  Employment status:  Retired Forensic scientist:  Medicare PT Recommendations:  [place] / Referral to community resources:  [place]  Patient/Family's Response to care:  Pt agreeable to SNF placement.  Patient/Family's Understanding of and Emotional Response to Diagnosis, Current Treatment, and Prognosis:  Pt seemed to understand his current physical limitations,  and acknowledged his concerns about placing his care on his wife. Pt discussed being hopeful that the rehab would allow him to recover strength to return home and be less of a burden with his physical care needs.  Emotional Assessment Appearance:  Appears stated age Attitude/Demeanor/Rapport:    Affect (typically observed):  Appropriate Orientation:  Oriented to Self, Oriented to Place, Oriented to  Time, Oriented to Situation Alcohol / Substance use:  Not Applicable Psych involvement (Current and /or in the community):  No (Comment)  Discharge Needs  Concerns to be addressed:  Care Coordination, Discharge Planning Concerns Readmission within the last 30 days:  No Current discharge risk:  Physical Impairment Barriers to Discharge:  No Barriers Identified   Geralynn Ochs, LCSW 10/18/2016, 1:13 PM

## 2016-10-18 NOTE — Progress Notes (Signed)
Pt seen and examined.  No issues overnight. Denies any concerns. Feels well Eager for discharge today  EXAM: Temp:  [98.4 F (36.9 C)-100 F (37.8 C)] 98.4 F (36.9 C) (05/28 0513) Pulse Rate:  [63-84] 63 (05/28 0513) Resp:  [20] 20 (05/28 0513) BP: (136-163)/(57-72) 136/57 (05/28 0513) SpO2:  [95 %-96 %] 96 % (05/28 0513) Intake/Output      05/27 0701 - 05/28 0700 05/28 0701 - 05/29 0700   P.O. 600    IV Piggyback     Total Intake(mL/kg) 600 (7.2)    Urine (mL/kg/hr) 1000 (0.5)    Drains     Total Output 1000     Net -400           Awake and alert Follows commands throughout Full strength Wound c/d/i  Plan  Appears to be doing well.  Eager for discharge. Believe he is ready for discharge. Hospitalist signed off. Appreciative of assistance. PT/OT rec SNF. Dispo planning

## 2016-10-20 DIAGNOSIS — I2510 Atherosclerotic heart disease of native coronary artery without angina pectoris: Secondary | ICD-10-CM | POA: Diagnosis not present

## 2016-10-20 DIAGNOSIS — K5903 Drug induced constipation: Secondary | ICD-10-CM | POA: Diagnosis not present

## 2016-10-20 DIAGNOSIS — Z952 Presence of prosthetic heart valve: Secondary | ICD-10-CM | POA: Diagnosis not present

## 2016-10-20 DIAGNOSIS — M0579 Rheumatoid arthritis with rheumatoid factor of multiple sites without organ or systems involvement: Secondary | ICD-10-CM | POA: Diagnosis not present

## 2016-10-20 DIAGNOSIS — M48062 Spinal stenosis, lumbar region with neurogenic claudication: Secondary | ICD-10-CM | POA: Diagnosis not present

## 2016-10-20 LAB — BPAM RBC
Blood Product Expiration Date: 201806202359
Blood Product Expiration Date: 201806202359
Unit Type and Rh: 5100
Unit Type and Rh: 5100

## 2016-10-20 LAB — TYPE AND SCREEN
ABO/RH(D): O POS
Antibody Screen: NEGATIVE
Unit division: 0
Unit division: 0

## 2016-10-21 ENCOUNTER — Ambulatory Visit: Admission: RE | Admit: 2016-10-21 | Payer: Medicare Other | Source: Ambulatory Visit

## 2016-10-21 ENCOUNTER — Other Ambulatory Visit: Payer: Self-pay | Admitting: *Deleted

## 2016-10-21 DIAGNOSIS — E785 Hyperlipidemia, unspecified: Secondary | ICD-10-CM | POA: Diagnosis not present

## 2016-10-21 DIAGNOSIS — R52 Pain, unspecified: Secondary | ICD-10-CM | POA: Diagnosis not present

## 2016-10-21 DIAGNOSIS — G4700 Insomnia, unspecified: Secondary | ICD-10-CM | POA: Diagnosis not present

## 2016-10-21 DIAGNOSIS — D509 Iron deficiency anemia, unspecified: Secondary | ICD-10-CM

## 2016-10-21 DIAGNOSIS — I10 Essential (primary) hypertension: Secondary | ICD-10-CM | POA: Diagnosis not present

## 2016-10-21 DIAGNOSIS — F329 Major depressive disorder, single episode, unspecified: Secondary | ICD-10-CM | POA: Diagnosis not present

## 2016-10-21 NOTE — Patient Outreach (Signed)
[name]) Care Management  10/21/2016  B[name] 15-Sep-1940 [id number]  Met with patient and his wife at bedside of facility.  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 He is hoping to go home soon, no discharge date set.  Patient has history of Afib and CKD, recent back surgery.   RNCM reviewed [name] program services, left brochure and magnet for patient and wife.  Reviewed how to communicate with [name] for referral, they both voice understanding.  Patient does not feel he could benefit from [name] care management at this time, but appreciates the information.  Denies any issues with medications, transportation and has good support from wife.   Plan to sign off at this time. Royetta Crochet. Laymond Purser, RN, BSN, [place] [phone]) Business Cell  [phone]) Toll Free Office

## 2016-10-22 ENCOUNTER — Inpatient Hospital Stay: Payer: Medicare Other

## 2016-10-22 ENCOUNTER — Inpatient Hospital Stay: Payer: Medicare Other | Admitting: Hematology and Oncology

## 2016-10-27 DIAGNOSIS — B49 Unspecified mycosis: Secondary | ICD-10-CM | POA: Diagnosis not present

## 2016-10-27 DIAGNOSIS — M5137 Other intervertebral disc degeneration, lumbosacral region: Secondary | ICD-10-CM | POA: Diagnosis not present

## 2016-10-27 DIAGNOSIS — R52 Pain, unspecified: Secondary | ICD-10-CM | POA: Diagnosis not present

## 2016-10-27 DIAGNOSIS — K219 Gastro-esophageal reflux disease without esophagitis: Secondary | ICD-10-CM | POA: Diagnosis not present

## 2016-10-27 DIAGNOSIS — F329 Major depressive disorder, single episode, unspecified: Secondary | ICD-10-CM | POA: Diagnosis not present

## 2016-10-28 ENCOUNTER — Other Ambulatory Visit: Payer: Self-pay

## 2016-10-28 ENCOUNTER — Telehealth: Payer: Self-pay | Admitting: Gastroenterology

## 2016-10-28 DIAGNOSIS — K219 Gastro-esophageal reflux disease without esophagitis: Secondary | ICD-10-CM

## 2016-10-28 MED ORDER — OMEPRAZOLE 40 MG PO CPDR: 40.0000 mg | DELAYED_RELEASE_CAPSULE | Freq: Every day | ORAL | 3 refills | Status: DC

## 2016-10-28 NOTE — Telephone Encounter (Signed)
Patients needs a RX for Omeprazole 40 mg. Insurance will cover it   [facility]

## 2016-10-30 NOTE — Progress Notes (Signed)
Cardiology Office Note  Date:  11/01/2016   ID:  WINIFRED BALOGH, DOB March 30, 1941, MRN [id number]  PCP:  [name], MD   Chief Complaint  Patient presents with  . other    6 month follow up. Paient denies chest pain and SOB. Meds reviewed verbally with patient.     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  Back surgery 2 weeks 5/24 Did rehab On pain patches, pain pills  Having symptoms of Night sweats over the past year Weight loss 5 pounds in 6 months Big drop in weight 10/2015 to 04/2016, 20 pounds  HCT 23, down from 29 in the past several months Has an appt with heme today Mild shortness of breath, fatigue  EKG personally reviewed by myself on todays visit Shows sinus bradycardia rate 52 bpm first degree AV block  Other past medical history reviewed Echo 11/2015: Left ventricle:  The estimated ejection fraction was in the  range of 50% to 55%.  - Aortic valve: A bioprosthesis was present. There was mild stenosis. Mean gradient (S): 20 mm Hg. Valve area (VTI): 1.24 cm^2. - Aortic root: The aortic root was mild to moderately dilated.44 mm (ED). Ascending aorta mildly dilated - Pulmonary arteries:moderately increased.PA peak pressure: 53 mm Hg (S).   foot surgery 10/13/2015, postoperative delirium, creatinine 1.9 well above his baseline October 2017 had recent back surgery lumbar spine   Echocardiogram June 2016 discussed with him  showing normal LV function, valve gradient consistent with bioprosthetic valve, moderately dilated ascending aorta, stable, 4.4 cm  Echocardiogram July 2015 showing mild to moderate aortic valve stenosis, 4.5 cm ascending aorta. No prior records available  Notes indicate some degree of carotid disease though details are not available.  PMH:   has a past medical history of Abnormal CT scan; Allergy; Anemia; Arrhythmia; Chronic lower back pain; Complication of anesthesia; Coronary artery disease; GERD (gastroesophageal reflux disease); Glaucoma; H/O thoracic aortic aneurysm repair; History of being hospitalized; History of blood transfusion; History of chicken pox; Hyperlipidemia; Hypertension; Lumbar stenosis; Osteoarthritis; Paroxysmal A-fib ([place]); Rheumatoid arthritis ([place]); and Valvular heart disease.  PSH:    Past Surgical History:  Procedure Laterality Date  . AORTIC VALVE REPLACEMENT  2007   [facility].  [place], [place]  . APPENDECTOMY  2010  . BACK SURGERY    . CARDIAC VALVE REPLACEMENT    . CATARACT EXTRACTION W/ INTRAOCULAR LENS  IMPLANT, BILATERAL Bilateral   . COLONOSCOPY WITH PROPOFOL N/A 09/24/2016   Procedure: COLONOSCOPY WITH PROPOFOL;  Surgeon: Jonathon Bellows, MD;  Location: [facility] ENDOSCOPY;  Service: Endoscopy;  Laterality: N/A;  . ESOPHAGOGASTRODUODENOSCOPY (EGD) WITH PROPOFOL N/A 09/24/2016   Procedure: ESOPHAGOGASTRODUODENOSCOPY (EGD) WITH PROPOFOL;  Surgeon: Jonathon Bellows, MD;  Location: [facility] ENDOSCOPY;  Service: Endoscopy;  Laterality: N/A;  . GIVENS CAPSULE STUDY N/A 09/24/2016   Procedure: GIVENS CAPSULE STUDY;  Surgeon: Jonathon Bellows, MD;  Location[facility] ENDOSCOPY;  Service: Endoscopy;  Laterality: N/A;  . HAMMER TOE SURGERY Right 10/09/2015   Procedure: HAMMER TOE REPAIR  WITH K-WIRE FIXATION RIGHT SECOND TOE;  Surgeon: Albertine Patricia, DPM;  Location: [place];  Service: Podiatry;  Laterality: Right;  WITH LOCAL  . JOINT REPLACEMENT    . LAPAROSCOPIC  CHOLECYSTECTOMY  2010  . LUMBAR LAMINECTOMY/DECOMPRESSION MICRODISCECTOMY Right 03/08/2016   Procedure: Laminectomy and Foraminotomy - Lumbar Five-Sacral One Right;  Surgeon: Kary Kos, MD;  Location: [place];  Service: Neurosurgery;  Laterality: Right;  Right  . MULTIPLE TOOTH EXTRACTIONS     "3"  . POSTERIOR LUMBAR FUSION  10/14/2016   L5-S1  . REPLACEMENT TOTAL KNEE Left 2003  . THORACIC AORTIC ANEURYSM REPAIR  2010    Current Outpatient Prescriptions  Medication Sig Dispense Refill  . amLODipine (NORVASC) 5 MG tablet Take 1 tablet (5 mg total) by mouth daily. 90 tablet 3  . amoxicillin (AMOXIL) 500 MG capsule Take 2,000 mg by mouth as needed (prior to dental procedures). Reported on 11/17/2015    . aspirin EC 81 MG tablet Take 81 mg by mouth daily.    Marland Kitchen b complex vitamins tablet Take 1 tablet by mouth daily.    . brimonidine (ALPHAGAN) 0.2 % ophthalmic solution Place 1 drop into both eyes every morning.     . cetirizine (ZYRTEC) 10 MG tablet Take 10 mg by mouth daily.    . cholecalciferol (VITAMIN D) 400 units TABS tablet Take 400 Units by mouth daily.    . fenofibrate 160 MG tablet Take 1 tablet (160 mg total) by mouth daily. 90 tablet 1  . fentaNYL (DURAGESIC - DOSED MCG/HR) 25 MCG/HR patch Apply one patch to skin every 3 days if tolerated (Patient taking differently: Place 25 mcg onto the skin every 3 (three) days. Apply one patch to skin every 3 days if tolerated) 10 patch 0  . ferrous sulfate 325 (65 FE) MG tablet Take 325 mg by mouth daily with breakfast.    . FLUoxetine (PROZAC) 20 MG capsule TAKE 1 CAPSULE BY MOUTH EVERY DAY 90 capsule 1  . folic acid (FOLVITE) 1 MG tablet Take 1 mg by mouth 2 (two) times daily.    Marland Kitchen gabapentin (NEURONTIN) 800 MG tablet Limit 1 tablet by mouth 2-3 times per day if tolerated (Patient taking differently: Take 800 mg by mouth 3 (three) times daily. ) 270 tablet 0  . hydrALAZINE (APRESOLINE) 25 MG tablet Take 1 tablet (25 mg total) by mouth 3 (three)  times daily. 90 tablet 3  . HYDROcodone-acetaminophen (NORCO/VICODIN) 5-325 MG tablet Take 1 tablet every 6 hours by mouth per day (max 4 tablets per day) for breakthrough pain while wearing fentanyl patch if tolerated 120 tablet 0  . hydrocortisone cream 1 % Apply 1 application topically daily as needed for itching.    Marland Kitchen lisinopril (PRINIVIL,ZESTRIL) 20 MG tablet Take 1 tablet (20 mg total) by mouth every evening. 90 tablet 3  . Magnesium 400 MG CAPS Take 400 mg by mouth daily.     Marland Kitchen omeprazole (PRILOSEC) 40 MG capsule Take 1 capsule (40 mg total) by mouth daily. 30 capsule 3  . predniSONE (DELTASONE) 5 MG tablet Take 5 mg by mouth daily as needed. Reported on 11/17/2015    . simvastatin (ZOCOR) 40 MG tablet Take 1 tablet (40 mg total) by mouth at bedtime. 90 tablet 3  . tamsulosin (FLOMAX) 0.4 MG CAPS capsule TAKE 1 CAPSULE BY MOUTH EVERY DAY 90 capsule 1  . traZODone (DESYREL) 50 MG tablet Take 100 mg by mouth at bedtime as needed for sleep. Reported on  10/09/2015    . vitamin B-12 (CYANOCOBALAMIN) 1000 MCG tablet Take 1,000 mcg by mouth daily.    . vitamin C (ASCORBIC ACID) 500 MG tablet Take 500 mg by mouth daily.     Current Facility-Administered Medications  Medication Dose Route Frequency Provider Last Rate Last Dose  . lactated ringers infusion 1,000 mL  1,000 mL Intravenous Continuous Mohammed Kindle, MD 125 mL/hr at 01/19/16 1011 1,000 mL at 01/19/16 1011  . midazolam (VERSED) 5 MG/5ML injection 5 mg  5 mg Intravenous Once Mohammed Kindle, MD         Allergies:   Penicillins and Demerol [meperidine]   Social History:  The patient  reports that he quit smoking about 35 years ago. His smoking use included Cigarettes. He has a 32.00 pack-year smoking history. He has never used smokeless tobacco. He reports that he drinks about 0.6 oz of alcohol per week . He reports that he does not use drugs.   Family History:   family history includes Asthma in his mother; Heart attack (age of onset:  4) in his father; Heart failure in his mother; Hypertension in his mother and sister.    Review of Systems: Review of Systems  Constitutional: Positive for malaise/fatigue.  Respiratory: Negative.   Cardiovascular: Negative.   Gastrointestinal: Negative.   Musculoskeletal: Negative.        Gait instability  Neurological: Negative.   Psychiatric/Behavioral: Positive for memory loss.  All other systems reviewed and are negative.    PHYSICAL EXAM: VS:  BP (!) 100/50 (BP Location: Left Arm, Patient Position: Sitting, Cuff Size: Normal)   Pulse (!) 52   Ht 5\' 11"  (1.803 m)   Wt 183 lb 8 oz (83.2 kg)   BMI 25.59 kg/m  , BMI Body mass index is 25.59 kg/m.  GEN: Well nourished, well developed, in no acute distress , appears pale HEENT: normal  Neck: no JVD, carotid bruits, or masses Cardiac: RRR; 1 to 2+ SEM RSB,  No rubs, or gallops,no edema  Respiratory:  clear to auscultation bilaterally, normal work of breathing GI: soft, nontender, nondistended, + BS MS: no deformity or atrophy  Skin: warm and dry, no rash Neuro:  Strength and sensation are intact Psych: euthymic mood, full affect    Recent Labs: 06/02/2016: Magnesium 1.9 10/06/2016: ALT 12 10/18/2016: BUN 15; Creatinine, Ser 0.99; Hemoglobin 7.8; Platelets 108; Potassium 3.7; Sodium 130    Lipid Panel Lab Results  Component Value Date   CHOL 122 05/30/2015   HDL 54.20 05/30/2015   LDLCALC 55 05/30/2015   TRIG 63.0 05/30/2015      Wt Readings from Last 3 Encounters:  11/01/16 183 lb 8 oz (83.2 kg)  10/14/16 183 lb (83 kg)  10/12/16 183 lb 8 oz (83.2 kg)       ASSESSMENT AND PLAN:   Paroxysmal atrial fibrillation (HCC) - Plan: EKG 12-Lead Maintaining normal sinus rhythm Remote history of postoperative atrial fibrillation in 2010 No changes to his medications  Essential hypertension - Plan: EKG 12-Lead Suggested he hold the hydralazine, blood pressure borderline low  CAD in native artery - Plan: EKG  12-Lead Currently with no symptoms of angina. No further workup at this time. Continue current medication regimen.  Aneurysm of ascending aorta, does post repair/grafting Visualized on previous echocardiogram, mild to moderately dilated 4.4 cm Repeat echocardiogram in 2019  Mixed hyperlipidemia Cholesterol is at goal on the current lipid regimen. No changes to the medications were made.  S/P AVR (aortic valve replacement)  Aortic valve stable, echocardiogram several months ago showing mild aortic valve stenosis.  Consider repeat echocardiogram in 2019 to evaluate aorta  Cognitive decline stopped Aricept as he was having bad dreams Followed by neurology  Bradycardia The first degree AV block, asymptomatic We will back off on his blood pressure regimen   Total encounter time more than 25 minutes  Greater than 50% was spent in counseling and coordination of care with the patient   Disposition:   F/U  6 months   Orders Placed This Encounter  Procedures  . EKG 12-Lead     Signed, Esmond Plants, M.D., Ph.D. 11/01/2016  [place], [place]

## 2016-11-01 ENCOUNTER — Inpatient Hospital Stay: Payer: Medicare Other | Attending: Hematology and Oncology | Admitting: Hematology and Oncology

## 2016-11-01 ENCOUNTER — Encounter: Payer: Self-pay | Admitting: Hematology and Oncology

## 2016-11-01 ENCOUNTER — Encounter: Payer: Self-pay | Admitting: Cardiovascular Disease

## 2016-11-01 ENCOUNTER — Inpatient Hospital Stay: Payer: Medicare Other

## 2016-11-01 ENCOUNTER — Other Ambulatory Visit: Payer: Self-pay | Admitting: Hematology and Oncology

## 2016-11-01 ENCOUNTER — Ambulatory Visit (INDEPENDENT_AMBULATORY_CARE_PROVIDER_SITE_OTHER): Payer: Medicare Other | Admitting: Cardiovascular Disease

## 2016-11-01 VITALS — BP 118/60 | HR 52 | Ht 71.0 in | Wt 183.5 lb

## 2016-11-01 VITALS — BP 112/54 | HR 66 | Temp 98.9°F | Resp 18 | Wt 185.6 lb

## 2016-11-01 DIAGNOSIS — L97519 Non-pressure chronic ulcer of other part of right foot with unspecified severity: Secondary | ICD-10-CM

## 2016-11-01 DIAGNOSIS — E785 Hyperlipidemia, unspecified: Secondary | ICD-10-CM | POA: Insufficient documentation

## 2016-11-01 DIAGNOSIS — Z952 Presence of prosthetic heart valve: Secondary | ICD-10-CM

## 2016-11-01 DIAGNOSIS — I480 Paroxysmal atrial fibrillation: Secondary | ICD-10-CM

## 2016-11-01 DIAGNOSIS — K5730 Diverticulosis of large intestine without perforation or abscess without bleeding: Secondary | ICD-10-CM | POA: Diagnosis not present

## 2016-11-01 DIAGNOSIS — Z79899 Other long term (current) drug therapy: Secondary | ICD-10-CM | POA: Diagnosis not present

## 2016-11-01 DIAGNOSIS — K219 Gastro-esophageal reflux disease without esophagitis: Secondary | ICD-10-CM | POA: Insufficient documentation

## 2016-11-01 DIAGNOSIS — D61818 Other pancytopenia: Secondary | ICD-10-CM

## 2016-11-01 DIAGNOSIS — F17210 Nicotine dependence, cigarettes, uncomplicated: Secondary | ICD-10-CM | POA: Diagnosis not present

## 2016-11-01 DIAGNOSIS — I10 Essential (primary) hypertension: Secondary | ICD-10-CM | POA: Diagnosis not present

## 2016-11-01 DIAGNOSIS — M069 Rheumatoid arthritis, unspecified: Secondary | ICD-10-CM | POA: Insufficient documentation

## 2016-11-01 DIAGNOSIS — H409 Unspecified glaucoma: Secondary | ICD-10-CM | POA: Insufficient documentation

## 2016-11-01 DIAGNOSIS — Z7982 Long term (current) use of aspirin: Secondary | ICD-10-CM | POA: Diagnosis not present

## 2016-11-01 DIAGNOSIS — D120 Benign neoplasm of cecum: Secondary | ICD-10-CM | POA: Insufficient documentation

## 2016-11-01 DIAGNOSIS — I7121 Aneurysm of the ascending aorta, without rupture: Secondary | ICD-10-CM

## 2016-11-01 DIAGNOSIS — M48061 Spinal stenosis, lumbar region without neurogenic claudication: Secondary | ICD-10-CM | POA: Diagnosis not present

## 2016-11-01 DIAGNOSIS — B3781 Candidal esophagitis: Secondary | ICD-10-CM | POA: Diagnosis not present

## 2016-11-01 DIAGNOSIS — I2510 Atherosclerotic heart disease of native coronary artery without angina pectoris: Secondary | ICD-10-CM | POA: Insufficient documentation

## 2016-11-01 DIAGNOSIS — I712 Thoracic aortic aneurysm, without rupture: Secondary | ICD-10-CM

## 2016-11-01 DIAGNOSIS — D509 Iron deficiency anemia, unspecified: Secondary | ICD-10-CM | POA: Diagnosis not present

## 2016-11-01 DIAGNOSIS — K648 Other hemorrhoids: Secondary | ICD-10-CM | POA: Diagnosis not present

## 2016-11-01 DIAGNOSIS — D649 Anemia, unspecified: Secondary | ICD-10-CM

## 2016-11-01 LAB — CBC WITH DIFFERENTIAL/PLATELET
Basophils Absolute: 0 10*3/uL (ref 0–0.1)
Basophils Relative: 1 %
Eosinophils Absolute: 0.1 10*3/uL (ref 0–0.7)
Eosinophils Relative: 4 %
HCT: 22.1 % — ABNORMAL LOW (ref 40.0–52.0)
Hemoglobin: 7.5 g/dL — ABNORMAL LOW (ref 13.0–18.0)
Lymphocytes Relative: 23 %
Lymphs Abs: 0.7 10*3/uL — ABNORMAL LOW (ref 1.0–3.6)
MCH: 30.4 pg (ref 26.0–34.0)
MCHC: 33.9 g/dL (ref 32.0–36.0)
MCV: 89.7 fL (ref 80.0–100.0)
Monocytes Absolute: 0.3 10*3/uL (ref 0.2–1.0)
Monocytes Relative: 9 %
Neutro Abs: 2 10*3/uL (ref 1.4–6.5)
Neutrophils Relative %: 63 %
Platelets: 199 10*3/uL (ref 150–440)
RBC: 2.47 MIL/uL — ABNORMAL LOW (ref 4.40–5.90)
RDW: 13.8 % (ref 11.5–14.5)
WBC: 3.1 10*3/uL — ABNORMAL LOW (ref 3.8–10.6)

## 2016-11-01 LAB — RETICULOCYTES
RBC.: 2.57 MIL/uL — ABNORMAL LOW (ref 4.40–5.90)
Retic Count, Absolute: 43.7 10*3/uL (ref 19.0–183.0)
Retic Ct Pct: 1.7 % (ref 0.4–3.1)

## 2016-11-01 NOTE — Progress Notes (Signed)
Patient did not have biopsy.

## 2016-11-01 NOTE — Patient Instructions (Signed)
Medication Instructions:   Please hold the hydralazine Take only as needed for high pressure  Labwork:  No new labs needed  Testing/Procedures:  No further testing at this time   I recommend watching educational videos on topics of interest to you at:       [email or web address]  Enter code: [email or web address]    Follow-Up: It was a pleasure seeing you in the office today. Please call us if you have new issues that need to be addressed before your next appt.  [phone]  Your physician wants you to follow-up in: 6 months.  You will receive a reminder letter in the mail two months in advance. If you don't receive a letter, please call our office to schedule the follow-up appointment.  If you need a refill on your cardiac medications before your next appointment, please call your pharmacy.

## 2016-11-01 NOTE — Progress Notes (Signed)
[facility] day:  11/01/2016  Chief Complaint: Bryan [name] is a 76 y.o. male with rheumatoid arthritis, iron deficiency anemia, and mild pancytopenia who is seen for 1 month assessment  HPI:  The patient was last seen in the hematology clinic on 10/12/2016.  At that time, plans were made for a bone marrow assessment.  He was admitted to [facility] on 10/14/2016 - 10/18/2016 for spinal stenosis of the lumbar region.  He underwent redo decompressive lumbar laminotomy L5-S1 with radical foraminotomy the L5 and S1 nerve root with complete medial facetectomies.  Symptomatically, he notes fatigue and shortness of breath.  He describes night sweats (3 t-shirts).  He denies any adenopathy, bruising or bleeding.   Past Medical History:  Diagnosis Date  . Abnormal CT scan    Asymmetric left rectal wall thickening   . Allergy   . Anemia   . Arrhythmia   . Chronic lower back pain   . Complication of anesthesia    Memory loss 09/2015  . Coronary artery disease   . GERD (gastroesophageal reflux disease)   . Glaucoma   . H/O thoracic aortic aneurysm repair   . History of being hospitalized    memory lose kidney funtion down blood pressure up  . History of blood transfusion    "think he had one when he had heart valve OR" (10/14/2016)  . History of chicken pox   . Hyperlipidemia   . Hypertension   . Lumbar stenosis   . Osteoarthritis    worse in feet and ankles  . Paroxysmal A-fib ([place])   . Rheumatoid arthritis ([place])    "all over" (10/14/2016)  . Valvular heart disease     Past Surgical History:  Procedure Laterality Date  . AORTIC VALVE REPLACEMENT  2007   [facility].  [place], [place]  . APPENDECTOMY  2010  . BACK SURGERY    . CARDIAC VALVE REPLACEMENT    . CATARACT EXTRACTION W/ INTRAOCULAR LENS  IMPLANT, BILATERAL Bilateral   . COLONOSCOPY WITH PROPOFOL N/A 09/24/2016   Procedure: COLONOSCOPY WITH PROPOFOL;  Surgeon: Jonathon Bellows, MD;  Location: [facility] ENDOSCOPY;  Service: Endoscopy;  Laterality: N/A;  . ESOPHAGOGASTRODUODENOSCOPY (EGD) WITH PROPOFOL N/A 09/24/2016   Procedure: ESOPHAGOGASTRODUODENOSCOPY (EGD) WITH PROPOFOL;  Surgeon: Jonathon Bellows, MD;  Location: [facility] ENDOSCOPY;  Service: Endoscopy;  Laterality: N/A;  . GIVENS CAPSULE STUDY N/A 09/24/2016   Procedure: GIVENS CAPSULE STUDY;  Surgeon: Jonathon Bellows, MD;  Location: [facility] ENDOSCOPY;  Service: Endoscopy;  Laterality: N/A;  . HAMMER TOE SURGERY Right 10/09/2015   Procedure: HAMMER TOE REPAIR WITH K-WIRE FIXATION RIGHT SECOND TOE;  Surgeon: Albertine Patricia, DPM;  Location: [place];  Service: Podiatry;  Laterality: Right;  WITH LOCAL  . JOINT REPLACEMENT Left 2008   knee  . LAPAROSCOPIC CHOLECYSTECTOMY  2010  . LUMBAR LAMINECTOMY/DECOMPRESSION MICRODISCECTOMY Right 03/08/2016   Procedure: Laminectomy and Foraminotomy - Lumbar Five-Sacral One Right;  Surgeon: Kary Kos, MD;  Location: [place];  Service: Neurosurgery;  Laterality: Right;  Right  . MULTIPLE TOOTH EXTRACTIONS     "3"  . POSTERIOR LUMBAR FUSION  10/14/2016   L5-S1  . REPLACEMENT TOTAL KNEE Left 2003  . THORACIC AORTIC ANEURYSM REPAIR  2010    Family History  Problem Relation Age of Onset  . Heart failure Mother   . Hypertension Mother   . Asthma Mother   . Heart attack Father 4  MI  . Hypertension Sister     Social History:  reports that he quit smoking about 35 years ago. His smoking use included Cigarettes. He has a 32.00 pack-year smoking history. He has never used smokeless tobacco. He reports that he drinks about 0.6 oz of alcohol per week . He reports that he does not use drugs.  He smoked 1 pack a day x 12 years.  He stopped smoking 30 years ago.  He drinks a glass of wine occasionally.  He is a Software engineer.  [place]  The patient is accompanied by his wife, Bryan [name], today.  Allergies:  Allergies  Allergen Reactions  .  Penicillins Hives and Swelling    SWELLING REACTION UNSPECIFIED PATIENT HAS TAKEN AMOXICILLIN ON MED HX FROM [facility] Has patient had a PCN reaction causing immediate rash, facial/tongue/throat swelling, SOB or lightheadedness with hypotension: Yes Has patient had a PCN reaction causing severe rash involving mucus membranes or skin necrosis: No Has patient had a PCN reaction that required hospitalization No Has patient had a PCN reaction occurring within the last 10 years: No If all of the above answers are "NO", then may proceed with  . Demerol [Meperidine] Hives and Nausea And Vomiting    Current Medications: Current Outpatient Prescriptions  Medication Sig Dispense Refill  . amLODipine (NORVASC) 5 MG tablet Take 1 tablet (5 mg total) by mouth daily. 90 tablet 3  . aspirin EC 81 MG tablet Take 81 mg by mouth daily.    Marland Kitchen b complex vitamins tablet Take 1 tablet by mouth daily.    . brimonidine (ALPHAGAN) 0.2 % ophthalmic solution Place 1 drop into both eyes every morning.     . cetirizine (ZYRTEC) 10 MG tablet Take 10 mg by mouth daily.    . cholecalciferol (VITAMIN D) 400 units TABS tablet Take 400 Units by mouth daily.    . fenofibrate 160 MG tablet Take 1 tablet (160 mg total) by mouth daily. 90 tablet 1  . fentaNYL (DURAGESIC - DOSED MCG/HR) 25 MCG/HR patch Apply one patch to skin every 3 days if tolerated (Patient taking differently: Place 25 mcg onto the skin every 3 (three) days. Apply one patch to skin every 3 days if tolerated) 10 patch 0  . ferrous sulfate 325 (65 FE) MG tablet Take 325 mg by mouth daily with breakfast.    . FLUoxetine (PROZAC) 20 MG capsule TAKE 1 CAPSULE BY MOUTH EVERY DAY 90 capsule 1  . folic acid (FOLVITE) 1 MG tablet Take 1 mg by mouth 2 (two) times daily.    Marland Kitchen gabapentin (NEURONTIN) 800 MG tablet Limit 1 tablet by mouth 2-3 times per day if tolerated (Patient taking differently: Take 800 mg by mouth 3 (three) times daily. ) 270 tablet 0  .  HYDROcodone-acetaminophen (NORCO/VICODIN) 5-325 MG tablet Take 1 tablet every 6 hours by mouth per day (max 4 tablets per day) for breakthrough pain while wearing fentanyl patch if tolerated 120 tablet 0  . hydrocortisone cream 1 % Apply 1 application topically daily as needed for itching.    Marland Kitchen lisinopril (PRINIVIL,ZESTRIL) 20 MG tablet Take 1 tablet (20 mg total) by mouth every evening. 90 tablet 3  . Magnesium 400 MG CAPS Take 400 mg by mouth daily.     Marland Kitchen omeprazole (PRILOSEC) 40 MG capsule Take 1 capsule (40 mg total) by mouth daily. 30 capsule 3  . predniSONE (DELTASONE) 5 MG tablet Take 5 mg  by mouth daily as needed. Reported on 11/17/2015    . simvastatin (ZOCOR) 40 MG tablet Take 1 tablet (40 mg total) by mouth at bedtime. 90 tablet 3  . tamsulosin (FLOMAX) 0.4 MG CAPS capsule TAKE 1 CAPSULE BY MOUTH EVERY DAY 90 capsule 1  . traZODone (DESYREL) 50 MG tablet Take 100 mg by mouth at bedtime as needed for sleep. Reported on 10/09/2015    . vitamin B-12 (CYANOCOBALAMIN) 1000 MCG tablet Take 1,000 mcg by mouth daily.    . vitamin C (ASCORBIC ACID) 500 MG tablet Take 500 mg by mouth daily.    Marland Kitchen amoxicillin (AMOXIL) 500 MG capsule Take 2,000 mg by mouth as needed (prior to dental procedures). Reported on 11/17/2015    . donepezil (ARICEPT) 5 MG tablet Take 5 mg by mouth daily.    . polyethylene glycol powder (GLYCOLAX/MIRALAX) powder USE 1 CAPFUL DAILY IN 4-8 OUNCES OF LIQUID 3350 g 0   Current Facility-Administered Medications  Medication Dose Route Frequency Provider Last Rate Last Dose  . lactated ringers infusion 1,000 mL  1,000 mL Intravenous Continuous Mohammed Kindle, MD 125 mL/hr at 01/19/16 1011 1,000 mL at 01/19/16 1011  . midazolam (VERSED) 5 MG/5ML injection 5 mg  5 mg Intravenous Once Mohammed Kindle, MD        Review of Systems:  GENERAL:  Fatigue.  No fevers.  Night sweats.  Weight stable. PERFORMANCE STATUS (ECOG):  1 HEENT:  No visual changes, sore throat, mouth sores or  tenderness. Lungs: Shortness of breath with exertion.  No cough.  No hemoptysis. Cardiac:  Aortic valve replacement.  No chest pain, palpitations, orthopnea, or PND. GI:  Irritable bowel.  No nausea, vomiting, diarrhea, constipation, melena or hematochezia. GU:  No urgency,dysuria, or hematuria. Musculoskeletal:  Chronic back problems s/p recent surgery.  Knee pain.  Severe rheumatoid arthritis.  Stiff in morning.  No muscle tenderness. Extremities:  No pain or swelling. Skin:  After bath itching.  No rashes or skin changes. Neuro:  Cane for balance.  No headache, numbness or weakness, balance or coordination issues. Endocrine:  No diabetes, thyroid issues, hot flashes or night sweats. Psych:  No mood changes, depression or anxiety. Pain:  No focal pain. Review of systems:  All other systems reviewed and found to be negative.  Physical Exam: Blood pressure (!) 112/54, pulse 66, temperature 98.9 F (37.2 C), temperature source Tympanic, resp. rate 18, weight 185 lb 9 oz (84.2 kg). GENERAL:  Elderly gentleman, sitting comfortably in the exam room in no acute distress. MENTAL STATUS:  Alert and oriented to person, place and time. HEAD:  Gray/white hair.  Thin beard.  Normocephalic, atraumatic, face symmetric, no Cushingoid features. EYES:  Hazel eyes.  Pupils equal round and reactive to light and accomodation.  No conjunctivitis or scleral icterus. ENT:  Oropharynx clear without lesion.  Tongue normal. Mucous membranes moist.  RESPIRATORY:  Clear to auscultation without rales, wheezes or rhonchi. CARDIOVASCULAR:  Regular rate and rhythm without murmur, rub or gallop. ABDOMEN:  Soft, non-tender, with active bowel sounds, and no hepatosplenomegaly.  No masses. SKIN:  No rashes, ulcers or lesions. EXTREMITIES: Ulnar deviation.  No edema, no skin discoloration or tenderness.  No palpable cords. LYMPH NODES: No palpable cervical, supraclavicular, axillary or inguinal adenopathy  NEUROLOGICAL:  Unremarkable. PSYCH:  Appropriate.   Clinical Support on 11/01/2016  Component Date Value Ref Range Status  . WBC 11/01/2016 3.1* 3.8 - 10.6 K/uL Final  . RBC 11/01/2016 2.47* 4.40 - 5.90 MIL/uL  Final  . Hemoglobin 11/01/2016 7.5* 13.0 - 18.0 g/dL Final  . HCT 11/01/2016 22.1* 40.0 - 52.0 % Final  . MCV 11/01/2016 89.7  80.0 - 100.0 fL Final  . MCH 11/01/2016 30.4  26.0 - 34.0 pg Final  . MCHC 11/01/2016 33.9  32.0 - 36.0 g/dL Final  . RDW 11/01/2016 13.8  11.5 - 14.5 % Final  . Platelets 11/01/2016 199  150 - 440 K/uL Final  . Neutrophils Relative % 11/01/2016 63  % Final  . Neutro Abs 11/01/2016 2.0  1.4 - 6.5 K/uL Final  . Lymphocytes Relative 11/01/2016 23  % Final  . Lymphs Abs 11/01/2016 0.7* 1.0 - 3.6 K/uL Final  . Monocytes Relative 11/01/2016 9  % Final  . Monocytes Absolute 11/01/2016 0.3  0.2 - 1.0 K/uL Final  . Eosinophils Relative 11/01/2016 4  % Final  . Eosinophils Absolute 11/01/2016 0.1  0 - 0.7 K/uL Final  . Basophils Relative 11/01/2016 1  % Final  . Basophils Absolute 11/01/2016 0.0  0 - 0.1 K/uL Final  . Retic Ct Pct 11/01/2016 1.7  0.4 - 3.1 % Final  . RBC. 11/01/2016 2.57* 4.40 - 5.90 MIL/uL Final  . Retic Count, Absolute 11/01/2016 43.7  19.0 - 183.0 K/uL Final  . ABO/RH(D) 11/01/2016 O POS   Final  . Antibody Screen 11/01/2016 NEG   Final  . Sample Expiration 11/01/2016 11/04/2016   Final  . Unit Number 11/01/2016 [id number]   Final  . Blood Component Type 11/01/2016 RBC, LR IRR   Final  . Unit division 11/01/2016 00   Final  . Status of Unit 11/01/2016 ISSUED,FINAL   Final  . Transfusion Status 11/01/2016 OK TO TRANSFUSE   Final  . Crossmatch Result 11/01/2016 Compatible   Final  . Unit tag comment 11/01/2016 IRRADIATED PRODUCT   Final  . ISSUE DATE / TIME 11/01/2016 [id number]   Final  . Blood Product Unit Number 11/01/2016 [id number]   Final  . PRODUCT CODE 11/01/2016 [id number]   Final  . Unit Type and Rh 11/01/2016 9500   Final  . Blood  Product Expiration Date 11/01/2016 [id number]   Final  Office Visit on 11/01/2016  Component Date Value Ref Range Status  . Order Confirmation 11/01/2016 ORDER PROCESSED BY BLOOD BANK   Final    Assessment:  Bryan [name] is a 76 y.o. male with rheumatoid arthritis and progressive anemia over the past 2 years.  He has been on Plaquenil and hydralazine which can cause anemia.  Plaquenil was discontinued on 08/26/2016.  He denies any exposure to radiation or toxins.  He denies any prior history of hepatitis, prior transfusions or HIV risk factors.  He denies any herbal products.  Diet is good.  EGD on 09/24/2016 revealed patchy candidiasis in the entire esophagus.  There were two non-bleeding angioectasias in the duodenum treated with argon plasma coagulation.  Gastritis was biopsied.  Pathology revealed mild chronic gastritis negative for H pylori, dysplasia and malignancy.  Colonoscopy on 09/24/2016 revealed diverticulosis in the entire colon, one 3 mm polyp in the cecum, and non-bleeding internal hemorrhoids.  Pathology from the cecal polyp revealed a tubular adenoma negative for high grade dysplasia and malignancy.    He denies any melena or hematochezia. He denies any hematuria.  CBC on 06/02/2016 revealed a hematocrit of 29.5, hemoglobin 10.0, MCV 92.3, platelets 155,000, and WBC 4200.  Stool was guaiac negative x 2 in 06/2016.  Labs on 06/04/2016 revealed a ferritin of 124, iron saturation  22% and TIBC of 370.  B12 was 883 on 06/18/2014.  Work-up on 07/19/2016 revealed a hematocrit of 31.7, hemoglobin 10.6, MCV 91, platelets 143,000, white count 3000 with an ANC of 1800.  Absolute lymphocyte count was 700 (low). Creatinine was 1.57.  LDH was 269 (98-192).  Normal labs included:  uric acid, folate, Coom[place], SPEP, and copper.  Iron studies included a saturation of 11% (low) and a TIBC of 454 (high) c/w iron deficiency anemia.  Sed rate was 19.  Retic was 0.9% (low).  He is on oral iron with  vitamin C.  Chest, abdomen, and pelvic CTon 08/09/2016 revealed no acute findings or clear evidence of malignancy in the chest, abdomen or pelvis. There was asymmetric left rectal wall thickeningpossibly secondary to volume averaging with the prostate gland.  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  He has treated with Septra then switched to doxycycline.  He was admitted to [facility] on 10/14/2016 - 10/18/2016 for spinal stenosis of the lumbar region.  He underwent redo decompressive lumbar laminotomy L5-S1 with radical foraminotomy the L5 and S1 nerve root with complete medial facetectomies.  Symptomatically, he is fatigued.  He has arthritis pain.  He is off Plaquenil.  Hematocrit is 22.1.  Plan: 1.  Labs today:  CBC with diff. 2.  Schedule PRBC transfusion for 11/02/2016. 3.  Reschedule bone marrow aspirate and biopsy later this week 4.  RTC end of next week for MD review of bone marrow   Lequita Asal, MD  11/01/2016

## 2016-11-02 ENCOUNTER — Other Ambulatory Visit: Payer: Self-pay | Admitting: Radiology

## 2016-11-02 ENCOUNTER — Inpatient Hospital Stay: Payer: Medicare Other

## 2016-11-02 DIAGNOSIS — K648 Other hemorrhoids: Secondary | ICD-10-CM | POA: Diagnosis not present

## 2016-11-02 DIAGNOSIS — D120 Benign neoplasm of cecum: Secondary | ICD-10-CM | POA: Diagnosis not present

## 2016-11-02 DIAGNOSIS — D61818 Other pancytopenia: Secondary | ICD-10-CM | POA: Diagnosis not present

## 2016-11-02 DIAGNOSIS — D649 Anemia, unspecified: Secondary | ICD-10-CM

## 2016-11-02 DIAGNOSIS — D509 Iron deficiency anemia, unspecified: Secondary | ICD-10-CM | POA: Diagnosis not present

## 2016-11-02 DIAGNOSIS — B3781 Candidal esophagitis: Secondary | ICD-10-CM | POA: Diagnosis not present

## 2016-11-02 DIAGNOSIS — M069 Rheumatoid arthritis, unspecified: Secondary | ICD-10-CM | POA: Diagnosis not present

## 2016-11-02 LAB — ABO/RH: ABO/RH(D): O POS

## 2016-11-02 LAB — PREPARE RBC (CROSSMATCH)

## 2016-11-02 MED ORDER — DIPHENHYDRAMINE HCL 25 MG PO CAPS
25.0000 mg | ORAL_CAPSULE | Freq: Once | ORAL | Status: AC
  Administered 2016-11-02: 25 mg via ORAL
  Filled 2016-11-02: qty 1

## 2016-11-02 MED ORDER — ACETAMINOPHEN 325 MG PO TABS
650.0000 mg | ORAL_TABLET | Freq: Once | ORAL | Status: AC
  Administered 2016-11-02: 650 mg via ORAL
  Filled 2016-11-02: qty 2

## 2016-11-02 MED ORDER — SODIUM CHLORIDE 0.9 % IV SOLN
250.0000 mL | Freq: Once | INTRAVENOUS | Status: AC
Start: 2016-11-02 — End: 2016-11-02
  Administered 2016-11-02: 250 mL via INTRAVENOUS
  Filled 2016-11-02: qty 250

## 2016-11-02 MED ORDER — SODIUM CHLORIDE 0.9% FLUSH
10.0000 mL | INTRAVENOUS | Status: DC | PRN
  Filled 2016-11-02: qty 10

## 2016-11-03 ENCOUNTER — Other Ambulatory Visit: Payer: Self-pay | Admitting: Family Medicine

## 2016-11-03 LAB — BPAM RBC
Blood Product Expiration Date: 201806152359
ISSUE DATE / TIME: 201806120944
Unit Type and Rh: 9500

## 2016-11-03 LAB — TYPE AND SCREEN
ABO/RH(D): O POS
Antibody Screen: NEGATIVE
Unit division: 0

## 2016-11-03 NOTE — Telephone Encounter (Signed)
Not on current medication list, please advise, thanks 

## 2016-11-04 ENCOUNTER — Telehealth: Payer: Self-pay | Admitting: *Deleted

## 2016-11-04 ENCOUNTER — Ambulatory Visit
Admission: RE | Admit: 2016-11-04 | Discharge: 2016-11-04 | Disposition: A | Discharge status: Home / Self Care | Payer: Medicare Other | Source: Ambulatory Visit | Attending: Hematology and Oncology | Admitting: Hematology and Oncology

## 2016-11-04 ENCOUNTER — Other Ambulatory Visit (HOSPITAL_COMMUNITY)
Admission: RE | Admit: 2016-11-04 | Discharge: 2016-11-04 | Disposition: A | Discharge status: Home / Self Care | Payer: Medicare Other | Source: Ambulatory Visit | Attending: Hematology and Oncology | Admitting: Hematology and Oncology

## 2016-11-04 DIAGNOSIS — Z888 Allergy status to other drugs, medicaments and biological substances status: Secondary | ICD-10-CM | POA: Diagnosis not present

## 2016-11-04 DIAGNOSIS — M48061 Spinal stenosis, lumbar region without neurogenic claudication: Secondary | ICD-10-CM | POA: Insufficient documentation

## 2016-11-04 DIAGNOSIS — Z87891 Personal history of nicotine dependence: Secondary | ICD-10-CM | POA: Insufficient documentation

## 2016-11-04 DIAGNOSIS — M069 Rheumatoid arthritis, unspecified: Secondary | ICD-10-CM | POA: Diagnosis not present

## 2016-11-04 DIAGNOSIS — I480 Paroxysmal atrial fibrillation: Secondary | ICD-10-CM | POA: Insufficient documentation

## 2016-11-04 DIAGNOSIS — D61818 Other pancytopenia: Secondary | ICD-10-CM | POA: Diagnosis not present

## 2016-11-04 DIAGNOSIS — H409 Unspecified glaucoma: Secondary | ICD-10-CM | POA: Diagnosis not present

## 2016-11-04 DIAGNOSIS — Z952 Presence of prosthetic heart valve: Secondary | ICD-10-CM | POA: Insufficient documentation

## 2016-11-04 DIAGNOSIS — Z7982 Long term (current) use of aspirin: Secondary | ICD-10-CM | POA: Insufficient documentation

## 2016-11-04 DIAGNOSIS — Z880 Allergy status to penicillin: Secondary | ICD-10-CM | POA: Insufficient documentation

## 2016-11-04 DIAGNOSIS — D649 Anemia, unspecified: Secondary | ICD-10-CM | POA: Diagnosis not present

## 2016-11-04 DIAGNOSIS — I2510 Atherosclerotic heart disease of native coronary artery without angina pectoris: Secondary | ICD-10-CM | POA: Insufficient documentation

## 2016-11-04 DIAGNOSIS — K219 Gastro-esophageal reflux disease without esophagitis: Secondary | ICD-10-CM | POA: Insufficient documentation

## 2016-11-04 DIAGNOSIS — E785 Hyperlipidemia, unspecified: Secondary | ICD-10-CM | POA: Diagnosis not present

## 2016-11-04 DIAGNOSIS — D474 Osteomyelofibrosis: Secondary | ICD-10-CM | POA: Diagnosis not present

## 2016-11-04 DIAGNOSIS — I10 Essential (primary) hypertension: Secondary | ICD-10-CM | POA: Diagnosis not present

## 2016-11-04 DIAGNOSIS — Z79899 Other long term (current) drug therapy: Secondary | ICD-10-CM | POA: Diagnosis not present

## 2016-11-04 DIAGNOSIS — Z9889 Other specified postprocedural states: Secondary | ICD-10-CM | POA: Insufficient documentation

## 2016-11-04 LAB — CBC WITH DIFFERENTIAL/PLATELET
Basophils Absolute: 0 10*3/uL (ref 0–0.1)
Basophils Relative: 1 %
Eosinophils Absolute: 0.1 10*3/uL (ref 0–0.7)
Eosinophils Relative: 4 %
HCT: 26.8 % — ABNORMAL LOW (ref 40.0–52.0)
Hemoglobin: 9.2 g/dL — ABNORMAL LOW (ref 13.0–18.0)
Lymphocytes Relative: 24 %
Lymphs Abs: 0.8 10*3/uL — ABNORMAL LOW (ref 1.0–3.6)
MCH: 30.7 pg (ref 26.0–34.0)
MCHC: 34.4 g/dL (ref 32.0–36.0)
MCV: 89.2 fL (ref 80.0–100.0)
Monocytes Absolute: 0.3 10*3/uL (ref 0.2–1.0)
Monocytes Relative: 11 %
Neutro Abs: 1.9 10*3/uL (ref 1.4–6.5)
Neutrophils Relative %: 60 %
Platelets: 183 10*3/uL (ref 150–440)
RBC: 3.01 MIL/uL — ABNORMAL LOW (ref 4.40–5.90)
RDW: 14.4 % (ref 11.5–14.5)
WBC: 3.2 10*3/uL — ABNORMAL LOW (ref 3.8–10.6)

## 2016-11-04 LAB — PROTIME-INR
INR: 1.01
Prothrombin Time: 13.3 seconds (ref 11.4–15.2)

## 2016-11-04 LAB — APTT: aPTT: 33 seconds (ref 24–36)

## 2016-11-04 MED ORDER — FENTANYL CITRATE (PF) 100 MCG/2ML IJ SOLN
INTRAMUSCULAR | Status: AC
  Filled 2016-11-04: qty 2

## 2016-11-04 MED ORDER — MIDAZOLAM HCL 5 MG/5ML IJ SOLN
INTRAMUSCULAR | Status: AC | PRN
  Administered 2016-11-04 (×2): 1 mg via INTRAVENOUS

## 2016-11-04 MED ORDER — MIDAZOLAM HCL 5 MG/5ML IJ SOLN
INTRAMUSCULAR | Status: AC
  Filled 2016-11-04: qty 5

## 2016-11-04 MED ORDER — HEPARIN SOD (PORK) LOCK FLUSH 100 UNIT/ML IV SOLN
INTRAVENOUS | Status: AC
  Filled 2016-11-04: qty 5

## 2016-11-04 MED ORDER — SODIUM CHLORIDE 0.9 % IV SOLN
INTRAVENOUS | Status: DC
  Administered 2016-11-04: 09:00:00 via INTRAVENOUS

## 2016-11-04 MED ORDER — FENTANYL CITRATE (PF) 100 MCG/2ML IJ SOLN
INTRAMUSCULAR | Status: AC | PRN
  Administered 2016-11-04: 50 ug via INTRAVENOUS

## 2016-11-04 NOTE — H&P (Signed)
Chief Complaint: Patient was seen in consultation today for bone marrow biopsy at the request of [place] C  Referring Physician(s): [place] C  Patient Status: [facility] - Out-pt  History of Present Illness: Bryan [name] is a 76 y.o. male with pancytopenia requiring bone marrow biopsy for further work up.  Has had unintentionnal weight loss.  Otherwise asymptomatic.  Past Medical History:  Diagnosis Date  . Abnormal CT scan    Asymmetric left rectal wall thickening   . Allergy   . Anemia   . Arrhythmia   . Chronic lower back pain   . Complication of anesthesia    Memory loss 09/2015  . Coronary artery disease   . GERD (gastroesophageal reflux disease)   . Glaucoma   . H/O thoracic aortic aneurysm repair   . History of being hospitalized    memory lose kidney funtion down blood pressure up  . History of blood transfusion    "think he had one when he had heart valve OR" (10/14/2016)  . History of chicken pox   . Hyperlipidemia   . Hypertension   . Lumbar stenosis   . Osteoarthritis    worse in feet and ankles  . Paroxysmal A-fib ([place])   . Rheumatoid arthritis ([place])    "all over" (10/14/2016)  . Valvular heart disease     Past Surgical History:  Procedure Laterality Date  . AORTIC VALVE REPLACEMENT  2007   [facility].  [place]  . APPENDECTOMY  2010  . BACK SURGERY    . CARDIAC VALVE REPLACEMENT    . CATARACT EXTRACTION W/ INTRAOCULAR LENS  IMPLANT, BILATERAL Bilateral   . COLONOSCOPY WITH PROPOFOL N/A 09/24/2016   Procedure: COLONOSCOPY WITH PROPOFOL;  Surgeon: Jonathon Bellows, MD;  Location: [facility] ENDOSCOPY;  Service: Endoscopy;  Laterality: N/A;  . ESOPHAGOGASTRODUODENOSCOPY (EGD) WITH PROPOFOL N/A 09/24/2016   Procedure: ESOPHAGOGASTRODUODENOSCOPY (EGD) WITH PROPOFOL;  Surgeon: Jonathon Bellows, MD;  Location: [facility] ENDOSCOPY;  Service: Endoscopy;  Laterality: N/A;  . GIVENS CAPSULE STUDY N/A 09/24/2016   Procedure: GIVENS CAPSULE STUDY;  Surgeon:  Jonathon Bellows, MD;  Location: [facility] ENDOSCOPY;  Service: Endoscopy;  Laterality: N/A;  . HAMMER TOE SURGERY Right 10/09/2015   Procedure: HAMMER TOE REPAIR WITH K-WIRE FIXATION RIGHT SECOND TOE;  Surgeon: Albertine Patricia, DPM;  Location: [place];  Service: Podiatry;  Laterality: Right;  WITH LOCAL  . JOINT REPLACEMENT Left 2008   knee  . LAPAROSCOPIC CHOLECYSTECTOMY  2010  . LUMBAR LAMINECTOMY/DECOMPRESSION MICRODISCECTOMY Right 03/08/2016   Procedure: Laminectomy and Foraminotomy - Lumbar Five-Sacral One Right;  Surgeon: Kary Kos, MD;  Location: [place];  Service: Neurosurgery;  Laterality: Right;  Right  . MULTIPLE TOOTH EXTRACTIONS     "3"  . POSTERIOR LUMBAR FUSION  10/14/2016   L5-S1  . REPLACEMENT TOTAL KNEE Left 2003  . THORACIC AORTIC ANEURYSM REPAIR  2010    Allergies: Penicillins and Demerol [meperidine]  Medications: Prior to Admission medications   Medication Sig Start Date End Date Taking? Authorizing Provider  amLODipine (NORVASC) 5 MG tablet Take 1 tablet (5 mg total) by mouth daily. 03/04/16  Yes [name], MD  aspirin EC 81 MG tablet Take 81 mg by mouth daily.   Yes [provider]  b complex vitamins tablet Take 1 tablet by mouth daily.   Yes [provider]  brimonidine (ALPHAGAN) 0.2 % ophthalmic solution Place 1 drop into both eyes every morning.  10/28/15  Yes [provider]  cetirizine (ZYRTEC) 10 MG tablet Take 10 mg by mouth daily.   Yes [provider]  cholecalciferol (VITAMIN D) 400 units TABS tablet Take 400 Units by mouth daily.   Yes [provider]  fenofibrate 160 MG tablet Take 1 tablet (160 mg total) by mouth daily. 03/29/16  Yes Gollan, Kathlene November, MD  fentaNYL (DURAGESIC - DOSED MCG/HR) 25 MCG/HR patch Apply one patch to skin every 3 days if tolerated Patient taking differently: Place 25 mcg onto the skin every 3 (three) days. Apply one patch to skin every 3 days if tolerated 01/28/16  Yes  Mohammed Kindle, MD  ferrous sulfate 325 (65 FE) MG tablet Take 325 mg by mouth daily with breakfast.   Yes [provider]  FLUoxetine (PROZAC) 20 MG capsule TAKE 1 CAPSULE BY MOUTH EVERY DAY 09/01/16  Yes [name], MD  folic acid (FOLVITE) 1 MG tablet Take 1 mg by mouth 2 (two) times daily.   Yes [provider]  gabapentin (NEURONTIN) 800 MG tablet Limit 1 tablet by mouth 2-3 times per day if tolerated Patient taking differently: Take 800 mg by mouth 3 (three) times daily.  01/28/16  Yes Mohammed Kindle, MD  HYDROcodone-acetaminophen (NORCO/VICODIN) 5-325 MG tablet Take 1 tablet every 6 hours by mouth per day (max 4 tablets per day) for breakthrough pain while wearing fentanyl patch if tolerated 10/18/16  Yes Costella, Vista Mink, PA-C  hydrocortisone cream 1 % Apply 1 application topically daily as needed for itching.   Yes [provider]  lisinopril (PRINIVIL,ZESTRIL) 20 MG tablet Take 1 tablet (20 mg total) by mouth every evening. 03/08/16  Yes Minna Merritts, MD  Magnesium 400 MG CAPS Take 400 mg by mouth daily.    Yes [provider]  omeprazole (PRILOSEC) 40 MG capsule Take 1 capsule (40 mg total) by mouth daily. 10/28/16  Yes Jonathon Bellows, MD  polyethylene glycol powder [facility]) powder USE 1 CAPFUL DAILY IN 4-8 OUNCES OF LIQUID 11/03/16  Yes [name], MD  tamsulosin (FLOMAX) 0.4 MG CAPS capsule TAKE 1 CAPSULE BY MOUTH EVERY DAY 05/06/16  Yes [name], MD  traZODone (DESYREL) 50 MG tablet Take 100 mg by mouth at bedtime as needed for sleep. Reported on 10/09/2015 04/15/15  Yes [provider]  vitamin B-12 (CYANOCOBALAMIN) 1000 MCG tablet Take 1,000 mcg by mouth daily.   Yes [provider]  vitamin C (ASCORBIC ACID) 500 MG tablet Take 500 mg by mouth daily.   Yes [provider]  amoxicillin (AMOXIL) 500 MG capsule Take 2,000 mg by mouth as needed (prior to dental procedures). Reported on  11/17/2015    [provider]  predniSONE (DELTASONE) 5 MG tablet Take 5 mg by mouth daily as needed. Reported on 11/17/2015    [provider]  simvastatin (ZOCOR) 40 MG tablet Take 1 tablet (40 mg total) by mouth at bedtime. 12/15/15   Minna Merritts, MD     Family History  Problem Relation Age of Onset  . Heart failure Mother   . Hypertension Mother   . Asthma Mother   . Heart attack Father 14       MI  . Hypertension Sister     Social History   Social History  . Marital status: Married    Spouse name: N/A  . Number of children: N/A  . Years of education: N/A   Social History Main Topics  . Smoking status: Former Smoker  Packs/day: 1.00    Years: 32.00    Types: Cigarettes    Quit date: 07/05/1981  . Smokeless tobacco: Never Used  . Alcohol use 0.6 oz/week    1 Glasses of wine per week     Comment: 10/14/2016 "0-2 glasses of wine/wk"  . Drug use: No  . Sexual activity: Not Currently   Other Topics Concern  . None   Social History Narrative  . None    Review of Systems: A 12 point ROS discussed and pertinent positives are indicated in the HPI above.  All other systems are negative.  Review of Systems  Constitutional: Positive for unexpected weight change. Negative for activity change, appetite change, chills, diaphoresis, fatigue and fever.  Respiratory: Negative.   Cardiovascular: Negative.   Gastrointestinal: Negative.   Genitourinary: Negative.   Musculoskeletal: Positive for back pain and joint swelling. Negative for arthralgias, gait problem and myalgias.  Neurological:       Some lower extremity pain    Vital Signs: BP (!) 157/64   Pulse (!) 55   Temp 97 F (36.1 C) (Oral)   Resp 17   Wt 184 lb (83.5 kg)   SpO2 96%   BMI 25.66 kg/m   Physical Exam  Constitutional: He is oriented to person, place, and time. No distress.  HENT:  Head: Normocephalic and atraumatic.  Neck: Neck supple. No JVD present.  Cardiovascular:  Normal rate and regular rhythm.  Exam reveals no gallop and no friction rub.   Murmur heard. Systolic murmur  Pulmonary/Chest: Effort normal and breath sounds normal. No stridor. No respiratory distress. He has no wheezes. He has no rales.  Abdominal: Soft. Bowel sounds are normal. He exhibits no distension. There is no tenderness. There is no rebound.  Musculoskeletal: He exhibits no edema.  Lymphadenopathy:    He has no cervical adenopathy.  Neurological: He is alert and oriented to person, place, and time.  Skin: He is not diaphoretic.  Vitals reviewed.   Mallampati Score:  MD Evaluation Airway: WNL Heart: WNL Abdomen: WNL Chest/ Lungs: WNL ASA  Classification: 2 Mallampati/Airway Score: One  Imaging: Dg Lumbar Spine 2-3 Views  Result Date: 10/14/2016 CLINICAL DATA:  Lumbar fusion EXAM: LUMBAR SPINE - 2-3 VIEW; DG C-ARM 61-120 MIN COMPARISON:  None. FLUOROSCOPY TIME:  Fluoroscopy Time:  37 seconds Radiation Exposure Index (if provided by the fluoroscopic device): Not available Number of Acquired Spot Images: 2 FINDINGS: Pedicle screws are noted at L5 and S1. Interbody fusion is noted. No soft tissue abnormality is seen. IMPRESSION: L5-S1 fusion Electronically Signed   By: Inez Catalina M.D.   On: 10/14/2016 10:28   Dg Chest Port 1 View  Result Date: 10/15/2016 CLINICAL DATA:  Hypoxia EXAM: PORTABLE CHEST 1 VIEW COMPARISON:  CT chest 08/09/2016 FINDINGS: Postop median sternotomy and open heart surgery. Heart size within normal limits. Negative for heart failure Mild bibasilar atelectasis and scarring in the bases left greater than right as noted on CT. No effusion. IMPRESSION: Mild left lower lobe atelectasis and scarring. Negative for heart failure Electronically Signed   By: Franchot Gallo M.D.   On: 10/15/2016 15:34   Dg C-arm 1-60 Min  Result Date: 10/14/2016 CLINICAL DATA:  Lumbar fusion EXAM: LUMBAR SPINE - 2-3 VIEW; DG C-ARM 61-120 MIN COMPARISON:  None. FLUOROSCOPY TIME:   Fluoroscopy Time:  37 seconds Radiation Exposure Index (if provided by the fluoroscopic device): Not available Number of Acquired Spot Images: 2 FINDINGS: Pedicle screws are noted at L5 and  S1. Interbody fusion is noted. No soft tissue abnormality is seen. IMPRESSION: L5-S1 fusion Electronically Signed   By: Inez Catalina M.D.   On: 10/14/2016 10:28    Labs:  CBC:  Recent Labs  10/15/16 0830 10/18/16 0503 11/01/16 1507 11/04/16 0811  WBC 4.5 5.1 3.1* 3.2*  HGB 8.3* 7.8* 7.5* 9.2*  HCT 26.1* 23.5* 22.1* 26.8*  PLT 93* 108* 199 183    COAGS:  Recent Labs  01/19/16 0853 11/04/16 0811  INR 1.00 1.01  APTT 31 33    BMP:  Recent Labs  10/06/16 1348 10/14/16 1611 10/15/16 0830 10/18/16 0503  NA 137 134* 134* 130*  K 4.7 5.3* 4.1 3.7  CL 103 104 102 94*  CO2 26 22 26 29   GLUCOSE 98 128* 142* 99  BUN 23* 30* 18 15  CALCIUM 9.4 8.6* 8.7* 8.7*  CREATININE 1.27* 1.22 1.02 0.99  GFRNONAA 52* 55* >60 >60  GFRAA >60 >60 >60 >60    LIVER FUNCTION TESTS:  Recent Labs  07/19/16 1532 10/06/16 1348  BILITOT 0.4 0.4  AST 29 25  ALT 15* 12*  ALKPHOS 55 42  PROT 7.5 6.8  ALBUMIN 4.4 4.1    Assessment and Plan:  For CT guided bone marrow biopsy today.  Risks and Benefits discussed with the patient including, but not limited to bleeding, infection, damage to adjacent structures or low yield requiring additional tests. All of the patient's questions were answered, patient is agreeable to proceed. Consent signed and in chart.  Thank you for this interesting consult.  I greatly enjoyed meeting Bryan [name] and look forward to participating in their care.  A copy of this report was sent to the requesting provider on this date.  Electronically Signed: Azzie Roup, MD 11/04/2016, 1:32 PM     I spent a total of 15 Minutes in face to face in clinical consultation, greater than 50% of which was counseling/coordinating care for bone marrow biopsy.

## 2016-11-04 NOTE — Procedures (Signed)
Interventional Radiology Procedure Note  Procedure: CT guided aspirate and core biopsy of left iliac bone Complications: None Recommendations: - Bedrest supine x 1 hr - Follow biopsy results  Bryan Deschamps T. Kathlene Cote, M.D Pager:  [phone]

## 2016-11-04 NOTE — Telephone Encounter (Signed)
Asking if he needs a fu appt made since he had his bx today and he does not have one. I advised him that one is scheduled for 6/22 @ 3:45PM. He repeated this back to me

## 2016-11-08 ENCOUNTER — Ambulatory Visit: Payer: Medicare Other | Admitting: Gastroenterology

## 2016-11-08 ENCOUNTER — Other Ambulatory Visit: Payer: Self-pay | Admitting: Urology

## 2016-11-08 DIAGNOSIS — R270 Ataxia, unspecified: Secondary | ICD-10-CM | POA: Diagnosis not present

## 2016-11-08 DIAGNOSIS — M545 Low back pain: Secondary | ICD-10-CM | POA: Diagnosis not present

## 2016-11-08 DIAGNOSIS — R413 Other amnesia: Secondary | ICD-10-CM | POA: Diagnosis not present

## 2016-11-08 DIAGNOSIS — G4700 Insomnia, unspecified: Secondary | ICD-10-CM | POA: Diagnosis not present

## 2016-11-08 DIAGNOSIS — G8929 Other chronic pain: Secondary | ICD-10-CM | POA: Diagnosis not present

## 2016-11-08 DIAGNOSIS — G609 Hereditary and idiopathic neuropathy, unspecified: Secondary | ICD-10-CM | POA: Diagnosis not present

## 2016-11-08 NOTE — Progress Notes (Deleted)
4:50 PM   Bryan [name] [birth year] [id number]  Referring provider: [name], MD [address] [place], [place] [address]  No chief complaint on file.   HPI: Patient is a 76 year old white male with testosterone deficiency, erectile dysfunction and BPH with LUTS who presents today for a  follow up.    Testosterone deficiency Patient is experiencing a decrease in libido, a lack of energy, a decrease in strength,  erections being less strong, a recent deterioration in an ability to play sports, falling asleep after dinner and a recent deterioration in their work performance.  This is indicated by his responses to the ADAM questionnaire.  His last testosterone level was 421 ng/dL on 10/28/2015.  He is currently managing his testosterone deficiency with Testopel insertion.  His last insertion was on 06/2015.     Erectile dysfunction His SHIM score is ***, which is *** ED.   His previous SHIM score was 18.  He has been having difficulty with erections for the last several years.   His major complaint is achieving an erection.  His libido is diminished.   His risk factors for ED are age, BPH, hypogonadism, CAD and HTN.  He denies any painful erections or curvatures with his erections.      Score: 1-7 Severe ED 8-11 Moderate ED 12-16 Mild-Moderate ED 17-21 Mild ED 22-25 No ED   BPH WITH LUTS His IPSS score today is ***, which is *** lower urinary tract symptomatology. He is *** with his quality life due to his urinary symptoms.   His previous IPSS score was 9/3.  His previous PVR is 28 mL.  He denies any dysuria, hematuria or suprapubic pain.  His has had laser bladder outlet procedure in 05/05/2013.  He also denies any recent fevers, chills, nausea or vomiting.   He does not have a family history of PCa.    Score:  1-7 Mild 8-19 Moderate 20-35 Severe     PMH: Past Medical History:  Diagnosis Date  . Abnormal CT scan    Asymmetric left rectal wall  thickening   . Allergy   . Anemia   . Arrhythmia   . Chronic lower back pain   . Complication of anesthesia    Memory loss 09/2015  . Coronary artery disease   . GERD (gastroesophageal reflux disease)   . Glaucoma   . H/O thoracic aortic aneurysm repair   . History of being hospitalized    memory lose kidney funtion down blood pressure up  . History of blood transfusion    "think he had one when he had heart valve OR" (10/14/2016)  . History of chicken pox   . Hyperlipidemia   . Hypertension   . Lumbar stenosis   . Osteoarthritis    worse in feet and ankles  . Paroxysmal A-fib ([place])   . Rheumatoid arthritis ([place])    "all over" (10/14/2016)  . Valvular heart disease     Surgical History: Past Surgical History:  Procedure Laterality Date  . AORTIC VALVE REPLACEMENT  2007   [facility].  [place], [place]  . APPENDECTOMY  2010  . BACK SURGERY    . CARDIAC VALVE REPLACEMENT    . CATARACT EXTRACTION W/ INTRAOCULAR LENS  IMPLANT, BILATERAL Bilateral   . COLONOSCOPY WITH PROPOFOL N/A 09/24/2016   Procedure: COLONOSCOPY WITH PROPOFOL;  Surgeon: Jonathon Bellows, MD;  Location: [facility] ENDOSCOPY;  Service: Endoscopy;  Laterality: N/A;  . ESOPHAGOGASTRODUODENOSCOPY (EGD) WITH  PROPOFOL N/A 09/24/2016   Procedure: ESOPHAGOGASTRODUODENOSCOPY (EGD) WITH PROPOFOL;  Surgeon: Jonathon Bellows, MD;  Location: [facility] ENDOSCOPY;  Service: Endoscopy;  Laterality: N/A;  . GIVENS CAPSULE STUDY N/A 09/24/2016   Procedure: GIVENS CAPSULE STUDY;  Surgeon: Jonathon Bellows, MD;  Location: [facility] ENDOSCOPY;  Service: Endoscopy;  Laterality: N/A;  . HAMMER TOE SURGERY Right 10/09/2015   Procedure: HAMMER TOE REPAIR WITH K-WIRE FIXATION RIGHT SECOND TOE;  Surgeon: Albertine Patricia, DPM;  Location: [place];  Service: Podiatry;  Laterality: Right;  WITH LOCAL  . JOINT REPLACEMENT Left 2008   knee  . LAPAROSCOPIC CHOLECYSTECTOMY  2010  . LUMBAR LAMINECTOMY/DECOMPRESSION MICRODISCECTOMY Right 03/08/2016   Procedure:  Laminectomy and Foraminotomy - Lumbar Five-Sacral One Right;  Surgeon: Kary Kos, MD;  Location: [place];  Service: Neurosurgery;  Laterality: Right;  Right  . MULTIPLE TOOTH EXTRACTIONS     "3"  . POSTERIOR LUMBAR FUSION  10/14/2016   L5-S1  . REPLACEMENT TOTAL KNEE Left 2003  . THORACIC AORTIC ANEURYSM REPAIR  2010    Home Medications:  Allergies as of 11/09/2016      Reactions   Penicillins Hives, Swelling   SWELLING REACTION UNSPECIFIED PATIENT HAS TAKEN AMOXICILLIN ON MED HX FROM [facility] Has patient had a PCN reaction causing immediate rash, facial/tongue/throat swelling, SOB or lightheadedness with hypotension: Yes Has patient had a PCN reaction causing severe rash involving mucus membranes or skin necrosis: No Has patient had a PCN reaction that required hospitalization No Has patient had a PCN reaction occurring within the last 10 years: No If all of the above answers are "NO", then may proceed with   Demerol [meperidine] Hives, Nausea And Vomiting      Medication List       Accurate as of 11/08/16  4:50 PM. Always use your most recent med list.          amLODipine 5 MG tablet Commonly known as:  NORVASC Take 1 tablet (5 mg total) by mouth daily.   amoxicillin 500 MG capsule Commonly known as:  AMOXIL Take 2,000 mg by mouth as needed (prior to dental procedures). Reported on 11/17/2015   aspirin EC 81 MG tablet Take 81 mg by mouth daily.   b complex vitamins tablet Take 1 tablet by mouth daily.   brimonidine 0.2 % ophthalmic solution Commonly known as:  ALPHAGAN Place 1 drop into both eyes every morning.   cetirizine 10 MG tablet Commonly known as:  ZYRTEC Take 10 mg by mouth daily.   cholecalciferol 400 units Tabs tablet Commonly known as:  VITAMIN D Take 400 Units by mouth daily.   fenofibrate 160 MG tablet Take 1 tablet (160 mg total) by mouth daily.   fentaNYL 25 MCG/HR patch Commonly known as:  DURAGESIC - dosed mcg/hr Apply one patch to skin every 3  days if tolerated   ferrous sulfate 325 (65 FE) MG tablet Take 325 mg by mouth daily with breakfast.   FLUoxetine 20 MG capsule Commonly known as:  PROZAC TAKE 1 CAPSULE BY MOUTH EVERY DAY   folic acid 1 MG tablet Commonly known as:  FOLVITE Take 1 mg by mouth 2 (two) times daily.   gabapentin 800 MG tablet Commonly known as:  NEURONTIN Limit 1 tablet by mouth 2-3 times per day if tolerated   HYDROcodone-acetaminophen 5-325 MG tablet Commonly known as:  NORCO/VICODIN Take 1 tablet every 6 hours by mouth per day (max 4 tablets per day) for breakthrough pain while wearing fentanyl patch if  tolerated   hydrocortisone cream 1 % Apply 1 application topically daily as needed for itching.   lisinopril 20 MG tablet Commonly known as:  PRINIVIL,ZESTRIL Take 1 tablet (20 mg total) by mouth every evening.   Magnesium 400 MG Caps Take 400 mg by mouth daily.   omeprazole 40 MG capsule Commonly known as:  PRILOSEC Take 1 capsule (40 mg total) by mouth daily.   polyethylene glycol powder powder Commonly known as:  GLYCOLAX/MIRALAX USE 1 CAPFUL DAILY IN 4-8 OUNCES OF LIQUID   predniSONE 5 MG tablet Commonly known as:  DELTASONE Take 5 mg by mouth daily as needed. Reported on 11/17/2015   simvastatin 40 MG tablet Commonly known as:  ZOCOR Take 1 tablet (40 mg total) by mouth at bedtime.   tamsulosin 0.4 MG Caps capsule Commonly known as:  FLOMAX TAKE 1 CAPSULE BY MOUTH EVERY DAY   traZODone 50 MG tablet Commonly known as:  DESYREL Take 100 mg by mouth at bedtime as needed for sleep. Reported on 10/09/2015   vitamin B-12 1000 MCG tablet Commonly known as:  CYANOCOBALAMIN Take 1,000 mcg by mouth daily.   vitamin C 500 MG tablet Commonly known as:  ASCORBIC ACID Take 500 mg by mouth daily.       Allergies:  Allergies  Allergen Reactions  . Penicillins Hives and Swelling    SWELLING REACTION UNSPECIFIED PATIENT HAS TAKEN AMOXICILLIN ON MED HX FROM [facility] Has patient had  a PCN reaction causing immediate rash, facial/tongue/throat swelling, SOB or lightheadedness with hypotension: Yes Has patient had a PCN reaction causing severe rash involving mucus membranes or skin necrosis: No Has patient had a PCN reaction that required hospitalization No Has patient had a PCN reaction occurring within the last 10 years: No If all of the above answers are "NO", then may proceed with  . Demerol [Meperidine] Hives and Nausea And Vomiting    Family History: Family History  Problem Relation Age of Onset  . Heart failure Mother   . Hypertension Mother   . Asthma Mother   . Heart attack Father 33       MI  . Hypertension Sister     Social History:  reports that he quit smoking about 35 years ago. His smoking use included Cigarettes. He has a 32.00 pack-year smoking history. He has never used smokeless tobacco. He reports that he drinks about 0.6 oz of alcohol per week . He reports that he does not use drugs.  ROS:                                        Physical Exam: There were no vitals taken for this visit.  GU: Patient with uncircumcised phallus. Foreskin easily retracted  Urethral meatus is patent.  No penile discharge. No penile lesions or rashes. Scrotum without lesions, cysts, rashes and/or edema.  Testicles are located scrotally bilaterally. No masses are appreciated in the testicles. Left and right epididymis are normal. Rectal: Patient with  normal sphincter tone. Perineum without scarring or rashes. No rectal masses are appreciated. Prostate is approximately 30 grams, no nodules are appreciated. Seminal vesicles are normal.   Laboratory Data: Lab Results  Component Value Date   WBC 3.2 (L) 11/04/2016   HGB 9.2 (L) 11/04/2016   HCT 26.8 (L) 11/04/2016   MCV 89.2 11/04/2016   PLT 183 11/04/2016    Lab Results  Component  Value Date   CREATININE 0.99 10/18/2016    PSA history:  <0.1 ng/mL on 01/17/2014    0.2 ng/mL on  08/07/2014    0.3 ng/mL on 02/17/2015    0.2 ng/mL on 07/09/2015  <0.1 ng/mL on 08/2016  Lab Results  Component Value Date   TESTOSTERONE 421 10/28/2015   Assessment & Plan:    1. Testosterone deficiency:   Patient's hypogonadism is managed with Testopel insertion.  He is doing well with this therapy would like to continue.  He is scheduled for tomorrow.  2. BPH (benign prostatic hyperplasia) with LUTS:   Patient's IPSS score is 9/3.  He will continue his tamsulosin 0.4 mg daily.   He will follow up in 6 months for a PSA, exam and an IPSS score.    3. Erectile dysfunction:   SHIM score is 18.  We will continue to monitor.  He will RTC in 6 months for a SHIM score and exam.     No Follow-up on file.  Zara Council, [facility] [address], [place], [place] [address] [phone]

## 2016-11-09 ENCOUNTER — Ambulatory Visit: Payer: Medicare Other | Admitting: Urology

## 2016-11-09 DIAGNOSIS — R413 Other amnesia: Secondary | ICD-10-CM | POA: Insufficient documentation

## 2016-11-09 DIAGNOSIS — R270 Ataxia, unspecified: Secondary | ICD-10-CM | POA: Insufficient documentation

## 2016-11-09 DIAGNOSIS — G4700 Insomnia, unspecified: Secondary | ICD-10-CM | POA: Insufficient documentation

## 2016-11-11 DIAGNOSIS — M6281 Muscle weakness (generalized): Secondary | ICD-10-CM | POA: Diagnosis not present

## 2016-11-11 DIAGNOSIS — R413 Other amnesia: Secondary | ICD-10-CM | POA: Diagnosis not present

## 2016-11-11 DIAGNOSIS — I10 Essential (primary) hypertension: Secondary | ICD-10-CM | POA: Diagnosis not present

## 2016-11-11 DIAGNOSIS — R2689 Other abnormalities of gait and mobility: Secondary | ICD-10-CM | POA: Diagnosis not present

## 2016-11-11 DIAGNOSIS — G629 Polyneuropathy, unspecified: Secondary | ICD-10-CM | POA: Diagnosis not present

## 2016-11-11 DIAGNOSIS — I4891 Unspecified atrial fibrillation: Secondary | ICD-10-CM | POA: Diagnosis not present

## 2016-11-12 ENCOUNTER — Inpatient Hospital Stay (HOSPITAL_BASED_OUTPATIENT_CLINIC_OR_DEPARTMENT_OTHER): Payer: Medicare Other | Admitting: Hematology and Oncology

## 2016-11-12 VITALS — HR 69 | Temp 99.5°F | Resp 18 | Wt 180.1 lb

## 2016-11-12 DIAGNOSIS — K648 Other hemorrhoids: Secondary | ICD-10-CM | POA: Diagnosis not present

## 2016-11-12 DIAGNOSIS — D61818 Other pancytopenia: Secondary | ICD-10-CM | POA: Diagnosis not present

## 2016-11-12 DIAGNOSIS — Z79899 Other long term (current) drug therapy: Secondary | ICD-10-CM

## 2016-11-12 DIAGNOSIS — M069 Rheumatoid arthritis, unspecified: Secondary | ICD-10-CM | POA: Diagnosis not present

## 2016-11-12 DIAGNOSIS — K5730 Diverticulosis of large intestine without perforation or abscess without bleeding: Secondary | ICD-10-CM | POA: Diagnosis not present

## 2016-11-12 DIAGNOSIS — D120 Benign neoplasm of cecum: Secondary | ICD-10-CM

## 2016-11-12 DIAGNOSIS — D72819 Decreased white blood cell count, unspecified: Secondary | ICD-10-CM

## 2016-11-12 DIAGNOSIS — F17210 Nicotine dependence, cigarettes, uncomplicated: Secondary | ICD-10-CM

## 2016-11-12 DIAGNOSIS — D696 Thrombocytopenia, unspecified: Secondary | ICD-10-CM

## 2016-11-12 DIAGNOSIS — L97519 Non-pressure chronic ulcer of other part of right foot with unspecified severity: Secondary | ICD-10-CM | POA: Diagnosis not present

## 2016-11-12 DIAGNOSIS — D509 Iron deficiency anemia, unspecified: Secondary | ICD-10-CM | POA: Diagnosis not present

## 2016-11-12 DIAGNOSIS — B3781 Candidal esophagitis: Secondary | ICD-10-CM

## 2016-11-12 DIAGNOSIS — D649 Anemia, unspecified: Secondary | ICD-10-CM

## 2016-11-12 NOTE — Progress Notes (Signed)
[place] [facility] day:  11/12/2016  Chief Complaint: Bryan [name] is a 76 y.o. male with rheumatoid arthritis, iron deficiency anemia, and mild pancytopenia who is seen for review of interval bone marrow.  HPI:  The patient was last seen in the hematology clinic on 11/01/2016.  At that time, plans were made for a bone marrow.  He was fatigued.  Hematocrit was 22.1.  He received 1 unit of PRBCs on 11/02/2016.  Bone marrow aspirate and biopsy on 11/04/2016 revealed a normocellular marrow (20%) with trilineage hematopoiesis and maturation.  There was mild megakaryocytic atypia.  Correlation with cytogenetics is recommended to exclude a low grade myelodysplastic syndrome with atypia.  Complete analysis not available for review during the time of patient's visit to the clinic today; awaiting FISH and cytogenetics.   Since his last visit, patient reports that he is having issues with his lower extremities being weak and unsteady.  He denies pain. Patient suffered a fall last Friday resulting in pain and bruising to the RIGHT temporoparietal scalp, the RIGHT side of his neck, and to his RIGHT hip. Patient presents to the office today using a rollator walker.    Past Medical History:  Diagnosis Date  . Abnormal CT scan    Asymmetric left rectal wall thickening   . Allergy   . Anemia   . Arrhythmia   . Chronic lower back pain   . Complication of anesthesia    Memory loss 09/2015  . Coronary artery disease   . GERD (gastroesophageal reflux disease)   . Glaucoma   . H/O thoracic aortic aneurysm repair   . History of being hospitalized    memory lose kidney funtion down blood pressure up  . History of blood transfusion    "think he had one when he had heart valve OR" (10/14/2016)  . History of chicken pox   . Hyperlipidemia   . Hypertension   . Lumbar stenosis   . Osteoarthritis    worse in feet and ankles  . Paroxysmal A-fib ([place])   . Rheumatoid  arthritis ([place])    "all over" (10/14/2016)  . Valvular heart disease     Past Surgical History:  Procedure Laterality Date  . AORTIC VALVE REPLACEMENT  2007   [facility]  [place]  . APPENDECTOMY  2010  . BACK SURGERY    . CARDIAC VALVE REPLACEMENT    . CATARACT EXTRACTION W/ INTRAOCULAR LENS  IMPLANT, BILATERAL Bilateral   . COLONOSCOPY WITH PROPOFOL N/A 09/24/2016   Procedure: COLONOSCOPY WITH PROPOFOL;  Surgeon: Jonathon Bellows, MD;  Location: [facility] ENDOSCOPY;  Service: Endoscopy;  Laterality: N/A;  . ESOPHAGOGASTRODUODENOSCOPY (EGD) WITH PROPOFOL N/A 09/24/2016   Procedure: ESOPHAGOGASTRODUODENOSCOPY (EGD) WITH PROPOFOL;  Surgeon: Jonathon Bellows, MD;  Location: [facility] ENDOSCOPY;  Service: Endoscopy;  Laterality: N/A;  . GIVENS CAPSULE STUDY N/A 09/24/2016   Procedure: GIVENS CAPSULE STUDY;  Surgeon: Jonathon Bellows, MD;  Location: [facility] ENDOSCOPY;  Service: Endoscopy;  Laterality: N/A;  . HAMMER TOE SURGERY Right 10/09/2015   Procedure: HAMMER TOE REPAIR WITH K-WIRE FIXATION RIGHT SECOND TOE;  Surgeon: Albertine Patricia, DPM;  Location: [place];  Service: Podiatry;  Laterality: Right;  WITH LOCAL  . JOINT REPLACEMENT Left 2008   knee  . LAPAROSCOPIC CHOLECYSTECTOMY  2010  . LUMBAR LAMINECTOMY/DECOMPRESSION MICRODISCECTOMY Right 03/08/2016   Procedure: Laminectomy and Foraminotomy - Lumbar Five-Sacral One Right;  Surgeon: Kary Kos, MD;  Location: [place];  Service: Neurosurgery;  Laterality: Right;  Right  . MULTIPLE TOOTH EXTRACTIONS     "3"  . POSTERIOR LUMBAR FUSION  10/14/2016   L5-S1  . REPLACEMENT TOTAL KNEE Left 2003  . THORACIC AORTIC ANEURYSM REPAIR  2010    Family History  Problem Relation Age of Onset  . Heart failure Mother   . Hypertension Mother   . Asthma Mother   . Heart attack Father 41       MI  . Hypertension Sister     Social History:  reports that he quit smoking about 35 years ago. His smoking use included Cigarettes. He has a 32.00 pack-year smoking  history. He has never used smokeless tobacco. He reports that he drinks about 0.6 oz of alcohol per week . He reports that he does not use drugs.  He smoked 1 pack a day x 12 years.  He stopped smoking 30 years ago.  He drinks a glass of wine occasionally.  He is a Software engineer.  [place]  The patient presents to the office today with his wife.   Allergies:  Allergies  Allergen Reactions  . Penicillins Hives and Swelling    SWELLING REACTION UNSPECIFIED PATIENT HAS TAKEN AMOXICILLIN ON MED HX FROM [facility] Has patient had a PCN reaction causing immediate rash, facial/tongue/throat swelling, SOB or lightheadedness with hypotension: Yes Has patient had a PCN reaction causing severe rash involving mucus membranes or skin necrosis: No Has patient had a PCN reaction that required hospitalization No Has patient had a PCN reaction occurring within the last 10 years: No If all of the above answers are "NO", then may proceed with  . Demerol [Meperidine] Hives and Nausea And Vomiting    Current Medications: Current Outpatient Prescriptions  Medication Sig Dispense Refill  . amLODipine (NORVASC) 5 MG tablet Take 1 tablet (5 mg total) by mouth daily. 90 tablet 3  . amoxicillin (AMOXIL) 500 MG capsule Take 2,000 mg by mouth as needed (prior to dental procedures). Reported on 11/17/2015    . aspirin EC 81 MG tablet Take 81 mg by mouth daily.    Marland Kitchen b complex vitamins tablet Take 1 tablet by mouth daily.    . brimonidine (ALPHAGAN) 0.2 % ophthalmic solution Place 1 drop into both eyes every morning.     . cetirizine (ZYRTEC) 10 MG tablet Take 10 mg by mouth daily.    . cholecalciferol (VITAMIN D) 400 units TABS tablet Take 400 Units by mouth daily.    Marland Kitchen donepezil (ARICEPT) 5 MG tablet Take 5 mg by mouth daily.    . fenofibrate 160 MG tablet Take 1 tablet (160 mg total) by mouth daily. 90 tablet 1  . fentaNYL (DURAGESIC - DOSED MCG/HR) 25 MCG/HR patch Apply one  patch to skin every 3 days if tolerated (Patient taking differently: Place 25 mcg onto the skin every 3 (three) days. Apply one patch to skin every 3 days if tolerated) 10 patch 0  . ferrous sulfate 325 (65 FE) MG tablet Take 325 mg by mouth daily with breakfast.    . FLUoxetine (PROZAC) 20 MG capsule TAKE 1 CAPSULE BY MOUTH EVERY DAY 90 capsule 1  . folic acid (FOLVITE) 1 MG tablet Take 1 mg by mouth 2 (two) times daily.    Marland Kitchen gabapentin (NEURONTIN) 800 MG tablet Limit 1 tablet by mouth 2-3 times per day if tolerated (Patient taking differently: Take 800 mg by mouth 3 (three) times daily. )  270 tablet 0  . HYDROcodone-acetaminophen (NORCO/VICODIN) 5-325 MG tablet Take 1 tablet every 6 hours by mouth per day (max 4 tablets per day) for breakthrough pain while wearing fentanyl patch if tolerated 120 tablet 0  . hydrocortisone cream 1 % Apply 1 application topically daily as needed for itching.    Marland Kitchen lisinopril (PRINIVIL,ZESTRIL) 20 MG tablet Take 1 tablet (20 mg total) by mouth every evening. 90 tablet 3  . Magnesium 400 MG CAPS Take 400 mg by mouth daily.     Marland Kitchen omeprazole (PRILOSEC) 40 MG capsule Take 1 capsule (40 mg total) by mouth daily. 30 capsule 3  . polyethylene glycol powder (GLYCOLAX/MIRALAX) powder USE 1 CAPFUL DAILY IN 4-8 OUNCES OF LIQUID 3350 g 0  . predniSONE (DELTASONE) 5 MG tablet Take 5 mg by mouth daily as needed. Reported on 11/17/2015    . simvastatin (ZOCOR) 40 MG tablet Take 1 tablet (40 mg total) by mouth at bedtime. 90 tablet 3  . tamsulosin (FLOMAX) 0.4 MG CAPS capsule TAKE 1 CAPSULE BY MOUTH EVERY DAY 90 capsule 1  . traZODone (DESYREL) 50 MG tablet Take 100 mg by mouth at bedtime as needed for sleep. Reported on 10/09/2015    . vitamin B-12 (CYANOCOBALAMIN) 1000 MCG tablet Take 1,000 mcg by mouth daily.    . vitamin C (ASCORBIC ACID) 500 MG tablet Take 500 mg by mouth daily.     Current Facility-Administered Medications  Medication Dose Route Frequency Provider Last Rate  Last Dose  . lactated ringers infusion 1,000 mL  1,000 mL Intravenous Continuous Mohammed Kindle, MD 125 mL/hr at 01/19/16 1011 1,000 mL at 01/19/16 1011  . midazolam (VERSED) 5 MG/5ML injection 5 mg  5 mg Intravenous Once Mohammed Kindle, MD        Review of Systems:  GENERAL:  Feels tired and worn out.  No fevers.  Night sweats.  Weight loss of 5 pounds since last visit. PERFORMANCE STATUS (ECOG):  1 HEENT:  No visual changes, sore throat, mouth sores or tenderness. Lungs: Shortness of breath with exertion.  No cough.  No hemoptysis. Cardiac:  Aortic valve replacement.  No chest pain, palpitations, orthopnea, or PND. GI:  Irritable bowel.  No nausea, vomiting, diarrhea, constipation, melena or hematochezia. GU:  No urgency,dysuria, or hematuria. Musculoskeletal:  (+) back "soreness"; s/p surgery.  Bilateral knee pain.  Severe rheumatoid arthritis.  Stiff in morning.  No muscle tenderness. Bruising to RIGHT head, neck, and hip following a fall last Friday.  Extremities:  No pain or swelling. Skin:  After bath itching.  Bruising s/p fall.  No rashes or skin changes. Neuro:  Rollator walker for balance; using more frequently especially after fall.  No headache, numbness or weakness. (+) balance or coordination issues after surgery. Endocrine:  No diabetes, thyroid issues, hot flashes or night sweats. Psych:  No mood changes, depression or anxiety. Pain:  No focal pain. Review of systems:  All other systems reviewed and found to be negative.  Physical Exam: 183 Pulse 69, temperature 99.5 F (37.5 C), temperature source Tympanic, resp. rate 18, weight 180 lb 1 oz (81.7 kg). GENERAL:  Elderly gentleman, sitting comfortably in the exam room in no acute distress.  He has a rolling walker at his side. MENTAL STATUS:  Alert and oriented to person, place and time. HEAD:  Gray/white hair.  Thin beard.  Normocephalic, atraumatic, face symmetric, no Cushingoid features. EYES:  Hazel eyes.  Pupils equal  round and reactive to light and accomodation.  No conjunctivitis or scleral icterus. ENT:  Oropharynx clear without lesion.  Tongue normal. Mucous membranes moist.  NECK:  Bruising to the RIGHT temporoparietal scalp and the RIGHT side of his neck. RESPIRATORY:  Clear to auscultation without rales, wheezes or rhonchi. CARDIOVASCULAR:  Regular rate and rhythm without murmur, rub or gallop. ABDOMEN:  Soft, non-tender, with active bowel sounds, and no hepatosplenomegaly.  No masses. SKIN:  No rashes, ulcers or lesions. EXTREMITIES: Ulnar deviation.  No edema, no skin discoloration or tenderness.  No palpable cords. LYMPH NODES: No palpable cervical, supraclavicular, axillary or inguinal adenopathy  NEUROLOGICAL: Unremarkable. PSYCH:  Appropriate.   No visits with results within 3 Day(s) from this visit.  Latest known visit with results is:  Hospital Outpatient Visit on 11/04/2016  Component Date Value Ref Range Status  . aPTT 11/04/2016 33  24 - 36 seconds Final  . Prothrombin Time 11/04/2016 13.3  11.4 - 15.2 seconds Final  . INR 11/04/2016 1.01   Final  . WBC 11/04/2016 3.2* 3.8 - 10.6 K/uL Final  . RBC 11/04/2016 3.01* 4.40 - 5.90 MIL/uL Final  . Hemoglobin 11/04/2016 9.2* 13.0 - 18.0 g/dL Final  . HCT 11/04/2016 26.8* 40.0 - 52.0 % Final  . MCV 11/04/2016 89.2  80.0 - 100.0 fL Final  . MCH 11/04/2016 30.7  26.0 - 34.0 pg Final  . MCHC 11/04/2016 34.4  32.0 - 36.0 g/dL Final  . RDW 11/04/2016 14.4  11.5 - 14.5 % Final  . Platelets 11/04/2016 183  150 - 440 K/uL Final  . Neutrophils Relative % 11/04/2016 60  % Final  . Neutro Abs 11/04/2016 1.9  1.4 - 6.5 K/uL Final  . Lymphocytes Relative 11/04/2016 24  % Final  . Lymphs Abs 11/04/2016 0.8* 1.0 - 3.6 K/uL Final  . Monocytes Relative 11/04/2016 11  % Final  . Monocytes Absolute 11/04/2016 0.3  0.2 - 1.0 K/uL Final  . Eosinophils Relative 11/04/2016 4  % Final  . Eosinophils Absolute 11/04/2016 0.1  0 - 0.7 K/uL Final  . Basophils  Relative 11/04/2016 1  % Final  . Basophils Absolute 11/04/2016 0.0  0 - 0.1 K/uL Final    Assessment:  Bryan [name] is a 76 y.o. male with rheumatoid arthritis and progressive anemia over the past 2 years.  He has been on Plaquenil and hydralazine which can cause anemia.  Plaquenil was discontinued on 08/26/2016.  He denies any exposure to radiation or toxins.  He denies any prior history of hepatitis, prior transfusions or HIV risk factors.  He denies any herbal products.  Diet is good.  EGD on 09/24/2016 revealed patchy candidiasis in the entire esophagus.  There were two non-bleeding angioectasias in the duodenum treated with argon plasma coagulation.  Gastritis was biopsied.  Pathology revealed mild chronic gastritis negative for H pylori, dysplasia and malignancy.  Colonoscopy on 09/24/2016 revealed diverticulosis in the entire colon, one 3 mm polyp in the cecum, and non-bleeding internal hemorrhoids.  Pathology from the cecal polyp revealed a tubular adenoma negative for high grade dysplasia and malignancy.    He denies any melena or hematochezia. He denies any hematuria.  CBC on 06/02/2016 revealed a hematocrit of 29.5, hemoglobin 10.0, MCV 92.3, platelets 155,000, and WBC 4200.  Stool was guaiac negative x 2 in 06/2016.  Labs on 06/04/2016 revealed a ferritin of 124, iron saturation 22% and TIBC of 370.  B12 was 883 on 06/18/2014.  Work-up on 07/19/2016 revealed a hematocrit of 31.7, hemoglobin 10.6, MCV 91,  platelets 143,000, white count 3000 with an ANC of 1800.  Absolute lymphocyte count was 700 (low). Creatinine was 1.57.  LDH was 269 (98-192).  Normal labs included:  uric acid, folate, Coombs, SPEP, and copper.  Iron studies included a saturation of 11% (low) and a TIBC of 454 (high) c/w iron deficiency anemia.  Sed rate was 19.  Retic was 0.9% (low).  He is on oral iron with vitamin C.  Chest, abdomen, and pelvic CTon 08/09/2016 revealed no acute findings or clear evidence of  malignancy in the chest, abdomen or pelvis. There was asymmetric left rectal wall thickeningpossibly secondary to volume averaging with the prostate gland.  Bone marrow aspirate and biopsy on 11/04/2016 revealed a normocellular marrow (20%) with trilineage hematopoiesis and maturation.  There was mild megakaryocytic atypia.  Storage iron was present.  There were rare ringed sideroblasts.  Correlation with cytogenetics is recommended to exclude a low grade myelodysplastic syndrome with atypia.   He was admitted to [facility] on 10/14/2016 - 10/18/2016 for spinal stenosis of the lumbar region.  He underwent redo decompressive lumbar laminotomy L5-S1 with radical foraminotomy the L5 and S1 nerve root with complete medial facetectomies.  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  He has treated with Septra then switched to doxycycline.  Symptomatically, he has arthritis pain.  He is off Plaquenil.  He notes lower extremity weakness and unsteadiness. He fell last Friday.   Plan: 1.  Review bone marrow.  No evidence of leukemia, lymphoma or myeloma.  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  Await cytogenetics and FISH studies. 2.  Mallie Snooks, RN to call with final bone marrow results next week. 3.  RTC in 1 month for MD assessment and labs (CBC with diff).  Addendum:  Labs will be drawn on 11/15/2016 (CBC, ferritin, iron studies, hold tube).  If any evidence of iron deficiency, will begin weekly Venofer.   Lequita Asal, MD  11/12/2016, 3:36 PM

## 2016-11-12 NOTE — Progress Notes (Signed)
Patient states he had a fall last week.  Continues to have back pain.  Otherwise no complaints.

## 2016-11-13 ENCOUNTER — Encounter: Payer: Self-pay | Admitting: Hematology and Oncology

## 2016-11-13 ENCOUNTER — Other Ambulatory Visit: Payer: Self-pay | Admitting: Hematology and Oncology

## 2016-11-15 ENCOUNTER — Other Ambulatory Visit: Payer: Self-pay | Admitting: *Deleted

## 2016-11-15 ENCOUNTER — Telehealth: Payer: Self-pay | Admitting: *Deleted

## 2016-11-15 DIAGNOSIS — D509 Iron deficiency anemia, unspecified: Secondary | ICD-10-CM

## 2016-11-15 NOTE — Telephone Encounter (Signed)
Called patient to inform him that MD would like to obtain additional labs on patient today.  Patient states he will not come today or tomorrow.  He will come when he gets his bone marrow results.

## 2016-11-15 NOTE — Telephone Encounter (Signed)
-----   Message from Lequita Asal, MD sent at 11/13/2016  4:39 AM EDT ----- Regarding: Let get him in for labs on Monday  He described weakness on Friday.  Last CBC was 06/14.  Orders in for Monday, 06/25.  M

## 2016-11-17 DIAGNOSIS — R413 Other amnesia: Secondary | ICD-10-CM | POA: Diagnosis not present

## 2016-11-17 DIAGNOSIS — R2689 Other abnormalities of gait and mobility: Secondary | ICD-10-CM | POA: Diagnosis not present

## 2016-11-17 DIAGNOSIS — I10 Essential (primary) hypertension: Secondary | ICD-10-CM | POA: Diagnosis not present

## 2016-11-17 DIAGNOSIS — M6281 Muscle weakness (generalized): Secondary | ICD-10-CM | POA: Diagnosis not present

## 2016-11-17 DIAGNOSIS — I4891 Unspecified atrial fibrillation: Secondary | ICD-10-CM | POA: Diagnosis not present

## 2016-11-17 DIAGNOSIS — G629 Polyneuropathy, unspecified: Secondary | ICD-10-CM | POA: Diagnosis not present

## 2016-11-18 ENCOUNTER — Ambulatory Visit: Payer: Medicare Other | Admitting: Urology

## 2016-11-19 DIAGNOSIS — G629 Polyneuropathy, unspecified: Secondary | ICD-10-CM | POA: Diagnosis not present

## 2016-11-19 DIAGNOSIS — I4891 Unspecified atrial fibrillation: Secondary | ICD-10-CM | POA: Diagnosis not present

## 2016-11-19 DIAGNOSIS — I10 Essential (primary) hypertension: Secondary | ICD-10-CM | POA: Diagnosis not present

## 2016-11-19 DIAGNOSIS — M6281 Muscle weakness (generalized): Secondary | ICD-10-CM | POA: Diagnosis not present

## 2016-11-19 DIAGNOSIS — R413 Other amnesia: Secondary | ICD-10-CM | POA: Diagnosis not present

## 2016-11-19 DIAGNOSIS — R2689 Other abnormalities of gait and mobility: Secondary | ICD-10-CM | POA: Diagnosis not present

## 2016-11-23 ENCOUNTER — Encounter (HOSPITAL_COMMUNITY): Payer: Self-pay

## 2016-11-23 DIAGNOSIS — R2689 Other abnormalities of gait and mobility: Secondary | ICD-10-CM | POA: Diagnosis not present

## 2016-11-23 DIAGNOSIS — I10 Essential (primary) hypertension: Secondary | ICD-10-CM | POA: Diagnosis not present

## 2016-11-23 DIAGNOSIS — G629 Polyneuropathy, unspecified: Secondary | ICD-10-CM | POA: Diagnosis not present

## 2016-11-23 DIAGNOSIS — R413 Other amnesia: Secondary | ICD-10-CM | POA: Diagnosis not present

## 2016-11-23 DIAGNOSIS — I4891 Unspecified atrial fibrillation: Secondary | ICD-10-CM | POA: Diagnosis not present

## 2016-11-23 DIAGNOSIS — M6281 Muscle weakness (generalized): Secondary | ICD-10-CM | POA: Diagnosis not present

## 2016-11-23 NOTE — Telephone Encounter (Signed)
Patient notified and would like to hold off for now

## 2016-11-23 NOTE — Telephone Encounter (Signed)
Noted  

## 2016-11-23 NOTE — Telephone Encounter (Signed)
Patient would like to stay on the current cholesterol medication, he has also not been able to receive any of his results form hematology, patient would like to have his anemia taken care of and the night sweats. He feels like he is getting no answers. 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 Please advise.

## 2016-11-23 NOTE — Telephone Encounter (Signed)
Left message to return call 

## 2016-11-23 NOTE — Telephone Encounter (Signed)
I am more than happy to refer him to a different hematologist/oncologist. This would mean going to [facility] or [facility]. If he would like this I can place a referral.

## 2016-11-23 NOTE — Telephone Encounter (Signed)
Left message to return call to inform patient of Dr.Sonnenbergs message below   Please let the patient know that there appears to be a possible interaction between his amlodipine and simvastatin. The possible interaction increases the risk for muscle damage. Please see if he would be willing to try an alternative cholesterol medication such as Lipitor or Crestor. Thanks. Randall Hiss

## 2016-11-26 DIAGNOSIS — G629 Polyneuropathy, unspecified: Secondary | ICD-10-CM | POA: Diagnosis not present

## 2016-11-26 DIAGNOSIS — M6281 Muscle weakness (generalized): Secondary | ICD-10-CM | POA: Diagnosis not present

## 2016-11-26 DIAGNOSIS — I10 Essential (primary) hypertension: Secondary | ICD-10-CM | POA: Diagnosis not present

## 2016-11-26 DIAGNOSIS — I4891 Unspecified atrial fibrillation: Secondary | ICD-10-CM | POA: Diagnosis not present

## 2016-11-26 DIAGNOSIS — R2689 Other abnormalities of gait and mobility: Secondary | ICD-10-CM | POA: Diagnosis not present

## 2016-11-26 DIAGNOSIS — R413 Other amnesia: Secondary | ICD-10-CM | POA: Diagnosis not present

## 2016-11-30 DIAGNOSIS — I4891 Unspecified atrial fibrillation: Secondary | ICD-10-CM | POA: Diagnosis not present

## 2016-11-30 DIAGNOSIS — M6281 Muscle weakness (generalized): Secondary | ICD-10-CM | POA: Diagnosis not present

## 2016-11-30 DIAGNOSIS — G629 Polyneuropathy, unspecified: Secondary | ICD-10-CM | POA: Diagnosis not present

## 2016-11-30 DIAGNOSIS — R413 Other amnesia: Secondary | ICD-10-CM | POA: Diagnosis not present

## 2016-11-30 DIAGNOSIS — I10 Essential (primary) hypertension: Secondary | ICD-10-CM | POA: Diagnosis not present

## 2016-11-30 DIAGNOSIS — R2689 Other abnormalities of gait and mobility: Secondary | ICD-10-CM | POA: Diagnosis not present

## 2016-12-01 ENCOUNTER — Other Ambulatory Visit: Payer: Self-pay | Admitting: Family Medicine

## 2016-12-02 DIAGNOSIS — M6281 Muscle weakness (generalized): Secondary | ICD-10-CM | POA: Diagnosis not present

## 2016-12-02 DIAGNOSIS — R413 Other amnesia: Secondary | ICD-10-CM | POA: Diagnosis not present

## 2016-12-02 DIAGNOSIS — R2689 Other abnormalities of gait and mobility: Secondary | ICD-10-CM | POA: Diagnosis not present

## 2016-12-02 DIAGNOSIS — I4891 Unspecified atrial fibrillation: Secondary | ICD-10-CM | POA: Diagnosis not present

## 2016-12-02 DIAGNOSIS — I10 Essential (primary) hypertension: Secondary | ICD-10-CM | POA: Diagnosis not present

## 2016-12-02 DIAGNOSIS — G629 Polyneuropathy, unspecified: Secondary | ICD-10-CM | POA: Diagnosis not present

## 2016-12-02 LAB — TISSUE HYBRIDIZATION (BONE MARROW)-NCBH

## 2016-12-02 LAB — CHROMOSOME ANALYSIS, BONE MARROW

## 2016-12-03 NOTE — Addendum Note (Signed)
Addendum  created 12/03/16 1311 by Lyndle Herrlich, MD   Sign clinical note

## 2016-12-03 NOTE — Anesthesia Postprocedure Evaluation (Signed)
Anesthesia Post Note  Patient: Bryan [name]  Procedure(s) Performed: Procedure(s) (LRB): Posterior Lumbar Interbody Fusion - Lumbar five-Sacral one (N/A)     Anesthesia Post Evaluation  Last Vitals:  Vitals:   10/18/16 0513 10/18/16 0911  BP: (!) 136/57 137/60  Pulse: 63 76  Resp: 20 19  Temp: 36.9 C 36.9 C    Last Pain:  Vitals:   10/18/16 1026  TempSrc:   PainSc: Asleep                 Trameka Dorough EDWARD

## 2016-12-07 ENCOUNTER — Ambulatory Visit: Payer: Medicare Other | Admitting: Family Medicine

## 2016-12-07 DIAGNOSIS — M5416 Radiculopathy, lumbar region: Secondary | ICD-10-CM | POA: Diagnosis not present

## 2016-12-07 DIAGNOSIS — M48062 Spinal stenosis, lumbar region with neurogenic claudication: Secondary | ICD-10-CM | POA: Diagnosis not present

## 2016-12-09 DIAGNOSIS — R413 Other amnesia: Secondary | ICD-10-CM | POA: Diagnosis not present

## 2016-12-09 DIAGNOSIS — G629 Polyneuropathy, unspecified: Secondary | ICD-10-CM | POA: Diagnosis not present

## 2016-12-09 DIAGNOSIS — R2689 Other abnormalities of gait and mobility: Secondary | ICD-10-CM | POA: Diagnosis not present

## 2016-12-09 DIAGNOSIS — I4891 Unspecified atrial fibrillation: Secondary | ICD-10-CM | POA: Diagnosis not present

## 2016-12-09 DIAGNOSIS — I10 Essential (primary) hypertension: Secondary | ICD-10-CM | POA: Diagnosis not present

## 2016-12-09 DIAGNOSIS — M6281 Muscle weakness (generalized): Secondary | ICD-10-CM | POA: Diagnosis not present

## 2016-12-13 DIAGNOSIS — M5416 Radiculopathy, lumbar region: Secondary | ICD-10-CM | POA: Diagnosis not present

## 2016-12-13 DIAGNOSIS — M5137 Other intervertebral disc degeneration, lumbosacral region: Secondary | ICD-10-CM | POA: Diagnosis not present

## 2016-12-13 DIAGNOSIS — M48062 Spinal stenosis, lumbar region with neurogenic claudication: Secondary | ICD-10-CM | POA: Diagnosis not present

## 2016-12-14 ENCOUNTER — Inpatient Hospital Stay: Payer: Medicare Other

## 2016-12-14 ENCOUNTER — Inpatient Hospital Stay: Payer: Medicare Other | Admitting: Hematology and Oncology

## 2016-12-14 DIAGNOSIS — I10 Essential (primary) hypertension: Secondary | ICD-10-CM | POA: Diagnosis not present

## 2016-12-14 DIAGNOSIS — G629 Polyneuropathy, unspecified: Secondary | ICD-10-CM | POA: Diagnosis not present

## 2016-12-14 DIAGNOSIS — M6281 Muscle weakness (generalized): Secondary | ICD-10-CM | POA: Diagnosis not present

## 2016-12-14 DIAGNOSIS — R2689 Other abnormalities of gait and mobility: Secondary | ICD-10-CM | POA: Diagnosis not present

## 2016-12-14 DIAGNOSIS — I4891 Unspecified atrial fibrillation: Secondary | ICD-10-CM | POA: Diagnosis not present

## 2016-12-14 DIAGNOSIS — R413 Other amnesia: Secondary | ICD-10-CM | POA: Diagnosis not present

## 2016-12-15 ENCOUNTER — Ambulatory Visit (INDEPENDENT_AMBULATORY_CARE_PROVIDER_SITE_OTHER): Payer: Medicare Other | Admitting: Family Medicine

## 2016-12-15 ENCOUNTER — Encounter: Payer: Self-pay | Admitting: Family Medicine

## 2016-12-15 VITALS — BP 150/80 | HR 48 | Temp 98.3°F | Wt 179.0 lb

## 2016-12-15 DIAGNOSIS — D649 Anemia, unspecified: Secondary | ICD-10-CM

## 2016-12-15 DIAGNOSIS — M791 Myalgia, unspecified site: Secondary | ICD-10-CM

## 2016-12-15 DIAGNOSIS — W19XXXA Unspecified fall, initial encounter: Secondary | ICD-10-CM

## 2016-12-15 DIAGNOSIS — E039 Hypothyroidism, unspecified: Secondary | ICD-10-CM | POA: Diagnosis not present

## 2016-12-15 DIAGNOSIS — M5136 Other intervertebral disc degeneration, lumbar region: Secondary | ICD-10-CM

## 2016-12-15 DIAGNOSIS — N3941 Urge incontinence: Secondary | ICD-10-CM | POA: Diagnosis not present

## 2016-12-15 DIAGNOSIS — E785 Hyperlipidemia, unspecified: Secondary | ICD-10-CM | POA: Diagnosis not present

## 2016-12-15 DIAGNOSIS — I10 Essential (primary) hypertension: Secondary | ICD-10-CM | POA: Diagnosis not present

## 2016-12-15 DIAGNOSIS — I2510 Atherosclerotic heart disease of native coronary artery without angina pectoris: Secondary | ICD-10-CM | POA: Diagnosis not present

## 2016-12-15 MED ORDER — ATORVASTATIN CALCIUM 40 MG PO TABS: 40.0000 mg | ORAL_TABLET | Freq: Every day | ORAL | 3 refills | Status: DC

## 2016-12-15 NOTE — Patient Instructions (Signed)
Nice to see you. Please continue the exercises from physical therapy. You should be using your cane or a walker when ambulating. Please continue to monitor your blood pressure. We'll switch to Lipitor. Please discontinue the simvastatin. We'll have you return for fasting lab work. Please contact your urologist to see them about sure urge incontinence.

## 2016-12-15 NOTE — Assessment & Plan Note (Signed)
Elevated today though at goal at home. He'll continue his current medications.

## 2016-12-15 NOTE — Assessment & Plan Note (Signed)
Following with hematology. Weight has stabilized. Had extensive workup for weight loss and night sweats. Unrevealing thus far. We'll check a TSH as this has not been checked in some time and if abnormal could be contributing to his night sweats. We'll continue to follow with hematology and monitor.

## 2016-12-15 NOTE — Assessment & Plan Note (Signed)
Patient with issues with this. Doubt this is related to his back. He has discussed with his surgeon already. He'll see urology.

## 2016-12-15 NOTE — Assessment & Plan Note (Signed)
Status post surgery. Encouraged to continue physical therapy exercises.

## 2016-12-15 NOTE — Progress Notes (Signed)
Bryan Rumps, MD Phone: [phone]  Bryan [name] is a 76 y.o. male who presents today for follow-up.  Hypertension: 130/60 typically at home. Taking amlodipine and lisinopril. No chest pain, shortness breath, or edema.  Hyperlipidemia: Taking simvastatin. There is a possible interaction between amlodipine and simvastatin. Patient's interested in switching to something different. His legs do occasionally ache though no other muscle aches. Right upper quadrant pain.  He has been following with hematology/oncology for anemia. They also evaluated weight loss and night sweats. His weight has stabilized at home. He continues to have some night sweats. He's had an extensive workup for this including lab work and imaging. Does appear it has been some time since he had his thyroid checked.  He reports he had a fall about 5 weeks ago. [place] and felt off balance with this and fell backwards. He struck the back of his head. Did have a bruise and slight knot though had no loss of consciousness. He's had no headaches. He fell to his right hip as well though this has not been bothering him at all. He's been working with physical therapy following back surgery and notes his legs feel stronger and stronger. He notes no pain in his hip now.  He does note some urge incontinence that started while he was in rehabilitation following his back surgery. Notes he'll get up and feel an urgency once he gets up from lying down and just can't make it to the bathroom quickly enough. He notes no bowel incontinence. He has discussed this with the surgeon and they think it is unrelated. He notes no straining or starting and stopping of urinary stream. Plans to see urology for this. He wonders if the Flomax could be contributing and would like to hold this to see if it helps. No bowel incontinence.  PMH: Former smoker   ROS see history of present  illness  Objective  Physical Exam Vitals:   12/15/16 1542 12/15/16 1631  BP: (!) 160/70 (!) 150/80  Pulse: (!) 51 60  Temp: 98.3 F (36.8 C)     BP Readings from Last 3 Encounters:  12/15/16 (!) 150/80  11/04/16 (!) 157/64  11/02/16 (!) 145/70   Wt Readings from Last 3 Encounters:  12/15/16 179 lb (81.2 kg)  11/12/16 180 lb 1 oz (81.7 kg)  11/04/16 184 lb (83.5 kg)    Physical Exam  Constitutional: No distress.  Cardiovascular: Normal rate, regular rhythm and normal heart sounds.   Pulmonary/Chest: Effort normal and breath sounds normal.  Musculoskeletal:  No palpable defects of posterior skull, no bruising, no swelling  Neurological: He is alert. Gait normal.  CN 2-12 intact, 5/5 strength in bilateral biceps, triceps, grip, quads, hamstrings, plantar and dorsiflexion, sensation to light touch intact in bilateral UE and LE  Skin: Skin is warm and dry. He is not diaphoretic.     Assessment/Plan: Please see individual problem list.  Essential hypertension Elevated today though at goal at home. He'll continue his current medications.  Hyperlipidemia We will switch him to Lipitor. He'll discontinue simvastatin. We'll check a CK given possible myalgias. He'll return for fasting lab work as well.  Fall Patient with single fall about 5 weeks ago that was likely multifactorial including environmental and related to chronic pain from knees and back. Did have injury to his head though no loss of consciousness no neurological deficits. Benign neurological exam today. Encouraged him to continue the exercises through physical  therapy and he is a cane or walker.  Urge incontinence Patient with issues with this. Doubt this is related to his back. He has discussed with his surgeon already. He'll see urology.  DDD (degenerative disc disease), lumbar Status post surgery. Encouraged to continue physical therapy exercises.  Anemia Following with hematology. Weight has stabilized.  Had extensive workup for weight loss and night sweats. Unrevealing thus far. We'll check a TSH as this has not been checked in some time and if abnormal could be contributing to his night sweats. We'll continue to follow with hematology and monitor.   Orders Placed This Encounter  Procedures  . TSH    Standing Status:   Future    Standing Expiration Date:   12/15/2017  . Lipid panel    Standing Status:   Future    Standing Expiration Date:   12/15/2017  . CK (Creatine Kinase)    Standing Status:   Future    Standing Expiration Date:   12/15/2017    Meds ordered this encounter  Medications  . atorvastatin (LIPITOR) 40 MG tablet    Sig: Take 1 tablet (40 mg total) by mouth daily.    Dispense:  90 tablet    Refill:  [place], MD [place]

## 2016-12-15 NOTE — Assessment & Plan Note (Signed)
We will switch him to Lipitor. He'll discontinue simvastatin. We'll check a CK given possible myalgias. He'll return for fasting lab work as well.

## 2016-12-15 NOTE — Assessment & Plan Note (Signed)
Patient with single fall about 5 weeks ago that was likely multifactorial including environmental and related to chronic pain from knees and back. Did have injury to his head though no loss of consciousness no neurological deficits. Benign neurological exam today. Encouraged him to continue the exercises through physical therapy and he is a cane or walker.

## 2016-12-16 ENCOUNTER — Other Ambulatory Visit (INDEPENDENT_AMBULATORY_CARE_PROVIDER_SITE_OTHER): Payer: Medicare Other

## 2016-12-16 ENCOUNTER — Other Ambulatory Visit: Payer: Self-pay | Admitting: Family Medicine

## 2016-12-16 ENCOUNTER — Other Ambulatory Visit: Payer: Self-pay | Admitting: Cardiovascular Disease

## 2016-12-16 DIAGNOSIS — M6281 Muscle weakness (generalized): Secondary | ICD-10-CM | POA: Diagnosis not present

## 2016-12-16 DIAGNOSIS — M791 Myalgia, unspecified site: Secondary | ICD-10-CM

## 2016-12-16 DIAGNOSIS — I4891 Unspecified atrial fibrillation: Secondary | ICD-10-CM | POA: Diagnosis not present

## 2016-12-16 DIAGNOSIS — I10 Essential (primary) hypertension: Secondary | ICD-10-CM | POA: Diagnosis not present

## 2016-12-16 DIAGNOSIS — E785 Hyperlipidemia, unspecified: Secondary | ICD-10-CM | POA: Diagnosis not present

## 2016-12-16 DIAGNOSIS — G629 Polyneuropathy, unspecified: Secondary | ICD-10-CM | POA: Diagnosis not present

## 2016-12-16 DIAGNOSIS — R413 Other amnesia: Secondary | ICD-10-CM | POA: Diagnosis not present

## 2016-12-16 DIAGNOSIS — E039 Hypothyroidism, unspecified: Secondary | ICD-10-CM

## 2016-12-16 DIAGNOSIS — R2689 Other abnormalities of gait and mobility: Secondary | ICD-10-CM | POA: Diagnosis not present

## 2016-12-16 LAB — LIPID PANEL
Cholesterol: 112 mg/dL (ref 0–200)
HDL: 49.4 mg/dL (ref >39.00)
LDL Cholesterol: 45 mg/dL (ref 0–99)
NonHDL: 62.79
Total CHOL/HDL Ratio: 2
Triglycerides: 90 mg/dL (ref 0.0–149.0)
VLDL: 18 mg/dL (ref 0.0–40.0)

## 2016-12-16 LAB — TSH: TSH: 0.77 u[IU]/mL (ref 0.35–4.50)

## 2016-12-16 LAB — CK: Total CK: 54 U/L (ref 7–232)

## 2016-12-17 ENCOUNTER — Inpatient Hospital Stay (HOSPITAL_BASED_OUTPATIENT_CLINIC_OR_DEPARTMENT_OTHER): Payer: Medicare Other | Admitting: Hematology and Oncology

## 2016-12-17 ENCOUNTER — Inpatient Hospital Stay: Payer: Medicare Other | Attending: Hematology and Oncology

## 2016-12-17 VITALS — BP 112/60 | HR 56 | Temp 97.3°F | Resp 18 | Wt 181.4 lb

## 2016-12-17 DIAGNOSIS — D120 Benign neoplasm of cecum: Secondary | ICD-10-CM | POA: Diagnosis not present

## 2016-12-17 DIAGNOSIS — D509 Iron deficiency anemia, unspecified: Secondary | ICD-10-CM

## 2016-12-17 DIAGNOSIS — Z952 Presence of prosthetic heart valve: Secondary | ICD-10-CM | POA: Diagnosis not present

## 2016-12-17 DIAGNOSIS — I2510 Atherosclerotic heart disease of native coronary artery without angina pectoris: Secondary | ICD-10-CM | POA: Diagnosis not present

## 2016-12-17 DIAGNOSIS — L97519 Non-pressure chronic ulcer of other part of right foot with unspecified severity: Secondary | ICD-10-CM

## 2016-12-17 DIAGNOSIS — Z79899 Other long term (current) drug therapy: Secondary | ICD-10-CM | POA: Insufficient documentation

## 2016-12-17 DIAGNOSIS — E785 Hyperlipidemia, unspecified: Secondary | ICD-10-CM | POA: Insufficient documentation

## 2016-12-17 DIAGNOSIS — D61818 Other pancytopenia: Secondary | ICD-10-CM | POA: Insufficient documentation

## 2016-12-17 DIAGNOSIS — I480 Paroxysmal atrial fibrillation: Secondary | ICD-10-CM | POA: Insufficient documentation

## 2016-12-17 DIAGNOSIS — I10 Essential (primary) hypertension: Secondary | ICD-10-CM | POA: Insufficient documentation

## 2016-12-17 DIAGNOSIS — Z87891 Personal history of nicotine dependence: Secondary | ICD-10-CM | POA: Insufficient documentation

## 2016-12-17 DIAGNOSIS — K219 Gastro-esophageal reflux disease without esophagitis: Secondary | ICD-10-CM | POA: Diagnosis not present

## 2016-12-17 DIAGNOSIS — K5730 Diverticulosis of large intestine without perforation or abscess without bleeding: Secondary | ICD-10-CM

## 2016-12-17 DIAGNOSIS — M48061 Spinal stenosis, lumbar region without neurogenic claudication: Secondary | ICD-10-CM | POA: Diagnosis not present

## 2016-12-17 DIAGNOSIS — M069 Rheumatoid arthritis, unspecified: Secondary | ICD-10-CM | POA: Diagnosis not present

## 2016-12-17 DIAGNOSIS — K2950 Unspecified chronic gastritis without bleeding: Secondary | ICD-10-CM | POA: Insufficient documentation

## 2016-12-17 DIAGNOSIS — D649 Anemia, unspecified: Secondary | ICD-10-CM

## 2016-12-17 DIAGNOSIS — K648 Other hemorrhoids: Secondary | ICD-10-CM

## 2016-12-17 DIAGNOSIS — D72819 Decreased white blood cell count, unspecified: Secondary | ICD-10-CM

## 2016-12-17 DIAGNOSIS — Z7982 Long term (current) use of aspirin: Secondary | ICD-10-CM | POA: Insufficient documentation

## 2016-12-17 DIAGNOSIS — H409 Unspecified glaucoma: Secondary | ICD-10-CM | POA: Diagnosis not present

## 2016-12-17 DIAGNOSIS — Z9841 Cataract extraction status, right eye: Secondary | ICD-10-CM | POA: Insufficient documentation

## 2016-12-17 DIAGNOSIS — D696 Thrombocytopenia, unspecified: Secondary | ICD-10-CM

## 2016-12-17 DIAGNOSIS — B3781 Candidal esophagitis: Secondary | ICD-10-CM | POA: Insufficient documentation

## 2016-12-17 DIAGNOSIS — R5383 Other fatigue: Secondary | ICD-10-CM

## 2016-12-17 LAB — CBC WITH DIFFERENTIAL/PLATELET
Basophils Absolute: 0 10*3/uL (ref 0–0.1)
Basophils Relative: 1 %
Eosinophils Absolute: 0.1 10*3/uL (ref 0–0.7)
Eosinophils Relative: 3 %
HCT: 29.5 % — ABNORMAL LOW (ref 40.0–52.0)
Hemoglobin: 10 g/dL — ABNORMAL LOW (ref 13.0–18.0)
Lymphocytes Relative: 26 %
Lymphs Abs: 1 10*3/uL (ref 1.0–3.6)
MCH: 30.4 pg (ref 26.0–34.0)
MCHC: 33.8 g/dL (ref 32.0–36.0)
MCV: 90 fL (ref 80.0–100.0)
Monocytes Absolute: 0.3 10*3/uL (ref 0.2–1.0)
Monocytes Relative: 8 %
Neutro Abs: 2.4 10*3/uL (ref 1.4–6.5)
Neutrophils Relative %: 62 %
Platelets: 135 10*3/uL — ABNORMAL LOW (ref 150–440)
RBC: 3.27 MIL/uL — ABNORMAL LOW (ref 4.40–5.90)
RDW: 14.7 % — ABNORMAL HIGH (ref 11.5–14.5)
WBC: 3.9 10*3/uL (ref 3.8–10.6)

## 2016-12-17 LAB — SEDIMENTATION RATE: Sed Rate: 23 mm/hr — ABNORMAL HIGH (ref 0–20)

## 2016-12-17 LAB — SAMPLE TO BLOOD BANK

## 2016-12-17 LAB — IRON AND TIBC
Iron: 45 ug/dL (ref 45–182)
Saturation Ratios: 13 % — ABNORMAL LOW (ref 17.9–39.5)
TIBC: 344 ug/dL (ref 250–450)
UIBC: 299 ug/dL

## 2016-12-17 LAB — VITAMIN B12: Vitamin B-12: 412 pg/mL (ref 180–914)

## 2016-12-17 LAB — FERRITIN: Ferritin: 108 ng/mL (ref 24–336)

## 2016-12-17 NOTE — Progress Notes (Signed)
Patient offers no complaints today. 

## 2016-12-17 NOTE — Progress Notes (Signed)
[place] [facility] day:  12/17/2016  Chief Complaint: Bryan [name] is a 76 y.o. male with rheumatoid arthritis, iron deficiency anemia, and mild pancytopenia who is seen for 1 month assessment.  HPI:  The patient was last seen in the hematology clinic on 11/12/2016.  At that time, bone marrow aspirate and biopsy on 11/04/2016 revealed a normocellular marrow (20%) with trilineage hematopoiesis and maturation.  There was mild megakaryocytic atypia.  Cytogenetics were normal (46, XY).  FISH is pending.  During the interim, he notes some sweats to testosterone. Previously, he was on testosterone 2 years ago.  He describes losing his chest hair after surgery. Thyroid hormone testing was normal yesterday. He remains off Plaquenil.  He has had no recent with Dr. [name].  He notes that he is still "a wreck". HE has had physical therapy for the past 2 months. Yesterday was his last day. His thighs are stronger. He is still unsteady on his feet.   Past Medical History:  Diagnosis Date  . Abnormal CT scan    Asymmetric left rectal wall thickening   . Allergy   . Anemia   . Arrhythmia   . Chronic lower back pain   . Complication of anesthesia    Memory loss 09/2015  . Coronary artery disease   . GERD (gastroesophageal reflux disease)   . Glaucoma   . H/O thoracic aortic aneurysm repair   . History of being hospitalized    memory lose kidney funtion down blood pressure up  . History of blood transfusion    "think he had one when he had heart valve OR" (10/14/2016)  . History of chicken pox   . Hyperlipidemia   . Hypertension   . Lumbar stenosis   . Osteoarthritis    worse in feet and ankles  . Paroxysmal A-fib ([place])   . Rheumatoid arthritis ([place])    "all over" (10/14/2016)  . Valvular heart disease     Past Surgical History:  Procedure Laterality Date  . AORTIC VALVE REPLACEMENT  2007   [facility].  [place], [place]  . APPENDECTOMY  2010   . BACK SURGERY    . CARDIAC VALVE REPLACEMENT    . CATARACT EXTRACTION W/ INTRAOCULAR LENS  IMPLANT, BILATERAL Bilateral   . COLONOSCOPY WITH PROPOFOL N/A 09/24/2016   Procedure: COLONOSCOPY WITH PROPOFOL;  Surgeon: Jonathon Bellows, MD;  Location: [facility] ENDOSCOPY;  Service: Endoscopy;  Laterality: N/A;  . ESOPHAGOGASTRODUODENOSCOPY (EGD) WITH PROPOFOL N/A 09/24/2016   Procedure: ESOPHAGOGASTRODUODENOSCOPY (EGD) WITH PROPOFOL;  Surgeon: Jonathon Bellows, MD;  Location: [facility] ENDOSCOPY;  Service: Endoscopy;  Laterality: N/A;  . GIVENS CAPSULE STUDY N/A 09/24/2016   Procedure: GIVENS CAPSULE STUDY;  Surgeon: Jonathon Bellows, MD;  Location: [facility] ENDOSCOPY;  Service: Endoscopy;  Laterality: N/A;  . HAMMER TOE SURGERY Right 10/09/2015   Procedure: HAMMER TOE REPAIR WITH K-WIRE FIXATION RIGHT SECOND TOE;  Surgeon: Albertine Patricia, DPM;  Location: [place];  Service: Podiatry;  Laterality: Right;  WITH LOCAL  . JOINT REPLACEMENT Left 2008   knee  . LAPAROSCOPIC CHOLECYSTECTOMY  2010  . LUMBAR LAMINECTOMY/DECOMPRESSION MICRODISCECTOMY Right 03/08/2016   Procedure: Laminectomy and Foraminotomy - Lumbar Five-Sacral One Right;  Surgeon: Kary Kos, MD;  Location: [place];  Service: Neurosurgery;  Laterality: Right;  Right  . MULTIPLE TOOTH EXTRACTIONS     "3"  . POSTERIOR LUMBAR FUSION  10/14/2016   L5-S1  . REPLACEMENT TOTAL KNEE Left 2003  . THORACIC AORTIC  ANEURYSM REPAIR  2010    Family History  Problem Relation Age of Onset  . Heart failure Mother   . Hypertension Mother   . Asthma Mother   . Heart attack Father 81       MI  . Hypertension Sister     Social History:  reports that he quit smoking about 35 years ago. His smoking use included Cigarettes. He has a 32.00 pack-year smoking history. He has never used smokeless tobacco. He reports that he drinks about 0.6 oz of alcohol per week . He reports that he does not use drugs.  He smoked 1 pack a day x 12 years.  He stopped smoking 30 years ago.  He  drinks a glass of wine occasionally.  He is a Software engineer.  [place]  The patient is alone today.  Allergies:  Allergies  Allergen Reactions  . Penicillins Hives and Swelling    SWELLING REACTION UNSPECIFIED PATIENT HAS TAKEN AMOXICILLIN ON MED HX FROM [facility] Has patient had a PCN reaction causing immediate rash, facial/tongue/throat swelling, SOB or lightheadedness with hypotension: Yes Has patient had a PCN reaction causing severe rash involving mucus membranes or skin necrosis: No Has patient had a PCN reaction that required hospitalization No Has patient had a PCN reaction occurring within the last 10 years: No If all of the above answers are "NO", then may proceed with  . Demerol [Meperidine] Hives and Nausea And Vomiting    Current Medications: Current Outpatient Prescriptions  Medication Sig Dispense Refill  . amLODipine (NORVASC) 5 MG tablet TAKE ONE TABLET BY MOUTH DAILY 90 tablet 1  . amoxicillin (AMOXIL) 500 MG capsule Take 2,000 mg by mouth as needed (prior to dental procedures). Reported on 11/17/2015    . aspirin EC 81 MG tablet Take 81 mg by mouth daily.    Marland Kitchen atorvastatin (LIPITOR) 40 MG tablet Take 1 tablet (40 mg total) by mouth daily. 90 tablet 3  . brimonidine (ALPHAGAN) 0.2 % ophthalmic solution Place 1 drop into both eyes every morning.     . cetirizine (ZYRTEC) 10 MG tablet Take 10 mg by mouth daily.    . cholecalciferol (VITAMIN D) 400 units TABS tablet Take 400 Units by mouth daily.    . fenofibrate 160 MG tablet TAKE ONE TABLET BY MOUTH EVERY DAY 90 tablet 0  . fentaNYL (DURAGESIC - DOSED MCG/HR) 25 MCG/HR patch Apply one patch to skin every 3 days if tolerated (Patient taking differently: Place 25 mcg onto the skin every 3 (three) days. Apply one patch to skin every 3 days if tolerated) 10 patch 0  . ferrous sulfate 325 (65 FE) MG tablet Take 325 mg by mouth daily with breakfast.    . FLUoxetine (PROZAC) 20 MG  capsule TAKE 1 CAPSULE BY MOUTH DAILY 90 capsule 1  . folic acid (FOLVITE) 1 MG tablet Take 1 mg by mouth 2 (two) times daily.    Marland Kitchen gabapentin (NEURONTIN) 800 MG tablet Limit 1 tablet by mouth 2-3 times per day if tolerated (Patient taking differently: Take 800 mg by mouth 3 (three) times daily. ) 270 tablet 0  . hydrocortisone cream 1 % Apply 1 application topically daily as needed for itching.    Marland Kitchen lisinopril (PRINIVIL,ZESTRIL) 20 MG tablet Take 1 tablet (20 mg total) by mouth every evening. 90 tablet 3  . Magnesium 400 MG CAPS Take 400 mg by mouth daily.     Marland Kitchen  omeprazole (PRILOSEC) 40 MG capsule Take 1 capsule (40 mg total) by mouth daily. 30 capsule 3  . polyethylene glycol powder (GLYCOLAX/MIRALAX) powder USE 1 CAPFUL DAILY IN 4-8 OUNCES OF LIQUID 3350 g 0  . predniSONE (DELTASONE) 5 MG tablet Take 5 mg by mouth daily as needed. Reported on 11/17/2015    . tamsulosin (FLOMAX) 0.4 MG CAPS capsule TAKE 1 CAPSULE EVERY DAY 90 capsule 0  . traZODone (DESYREL) 50 MG tablet Take 100 mg by mouth at bedtime as needed for sleep. Reported on 10/09/2015    . vitamin C (ASCORBIC ACID) 500 MG tablet Take 500 mg by mouth daily.    Marland Kitchen HYDROcodone-acetaminophen (NORCO/VICODIN) 5-325 MG tablet Take 1 tablet every 6 hours by mouth per day (max 4 tablets per day) for breakthrough pain while wearing fentanyl patch if tolerated 120 tablet 0   Current Facility-Administered Medications  Medication Dose Route Frequency Provider Last Rate Last Dose  . lactated ringers infusion 1,000 mL  1,000 mL Intravenous Continuous Mohammed Kindle, MD 125 mL/hr at 01/19/16 1011 1,000 mL at 01/19/16 1011  . midazolam (VERSED) 5 MG/5ML injection 5 mg  5 mg Intravenous Once Mohammed Kindle, MD        Review of Systems:  GENERAL:  Feels tired and worn out.  No fevers.  Some sweats.  Weight up 1 pound since last visit. PERFORMANCE STATUS (ECOG):  1 HEENT:  No visual changes, sore throat, mouth sores or tenderness. Lungs: No shortness  of breath.  No cough.  No hemoptysis. Cardiac:  Aortic valve replacement.  No chest pain, palpitations, orthopnea, or PND. GI:  Irritable bowel.  No nausea, vomiting, diarrhea, constipation, melena or hematochezia. GU:  No urgency,dysuria, or hematuria. Musculoskeletal:  (+) back "soreness"; s/p surgery.  Bilateral knee pain.  Severe rheumatoid arthritis.  Stiff in morning.  No muscle tenderness. Bruising to RIGHT head, neck, and hip following a fall last Friday.  Extremities:  No pain or swelling. Skin:  No rashes or skin changes.  No chest hair post surgery. Neuro:  No headache, numbness or weakness. (+) balance or coordination issues after surgery s/p PT (see HPI). Endocrine:  No diabetes, thyroid issues, hot flashes or night sweats.  Normal thyroid testing yesterday. Psych:  No mood changes, depression or anxiety. Pain:  No focal pain. Review of systems:  All other systems reviewed and found to be negative.  Physical Exam: 183 Blood pressure 112/60, pulse (!) 56, temperature (!) 97.3 F (36.3 C), temperature source Tympanic, resp. rate 18, weight 181 lb 6 oz (82.3 kg). GENERAL:  Elderly gentleman, sitting comfortably in the exam room in no acute distress.  He has a cane at his side. MENTAL STATUS:  Alert and oriented to person, place and time. HEAD:  Gray/white hair.  Thin beard.  Normocephalic, atraumatic, face symmetric, no Cushingoid features. EYES:  Hazel eyes.  Pupils equal round and reactive to light and accomodation.  No conjunctivitis or scleral icterus. ENT:  Oropharynx clear without lesion.  Tongue normal. Mucous membranes moist.  NECK:  Supple without adenopathy. RESPIRATORY:  Clear to auscultation without rales, wheezes or rhonchi. CARDIOVASCULAR:  Regular rate and rhythm without murmur, rub or gallop. ABDOMEN:  Soft, non-tender, with active bowel sounds, and no hepatosplenomegaly.  No masses. SKIN:  No rashes, ulcers or lesions. EXTREMITIES: Ulnar deviation.  No edema, no  skin discoloration or tenderness.  No palpable cords. LYMPH NODES: No palpable cervical, supraclavicular, axillary or inguinal adenopathy  NEUROLOGICAL: Unremarkable. PSYCH:  Appropriate.  Appointment on 12/17/2016  Component Date Value Ref Range Status  . WBC 12/17/2016 3.9  3.8 - 10.6 K/uL Final  . RBC 12/17/2016 3.27* 4.40 - 5.90 MIL/uL Final  . Hemoglobin 12/17/2016 10.0* 13.0 - 18.0 g/dL Final  . HCT 12/17/2016 29.5* 40.0 - 52.0 % Final  . MCV 12/17/2016 90.0  80.0 - 100.0 fL Final  . MCH 12/17/2016 30.4  26.0 - 34.0 pg Final  . MCHC 12/17/2016 33.8  32.0 - 36.0 g/dL Final  . RDW 12/17/2016 14.7* 11.5 - 14.5 % Final  . Platelets 12/17/2016 135* 150 - 440 K/uL Final  . Neutrophils Relative % 12/17/2016 62  % Final  . Neutro Abs 12/17/2016 2.4  1.4 - 6.5 K/uL Final  . Lymphocytes Relative 12/17/2016 26  % Final  . Lymphs Abs 12/17/2016 1.0  1.0 - 3.6 K/uL Final  . Monocytes Relative 12/17/2016 8  % Final  . Monocytes Absolute 12/17/2016 0.3  0.2 - 1.0 K/uL Final  . Eosinophils Relative 12/17/2016 3  % Final  . Eosinophils Absolute 12/17/2016 0.1  0 - 0.7 K/uL Final  . Basophils Relative 12/17/2016 1  % Final  . Basophils Absolute 12/17/2016 0.0  0 - 0.1 K/uL Final  Lab on 12/16/2016  Component Date Value Ref Range Status  . TSH 12/16/2016 0.77  0.35 - 4.50 uIU/mL Final  . Cholesterol 12/16/2016 112  0 - 200 mg/dL Final   ATP III Classification       Desirable:  < 200 mg/dL               Borderline High:  200 - 239 mg/dL          High:  > = 240 mg/dL  . Triglycerides 12/16/2016 90.0  0.0 - 149.0 mg/dL Final   Normal:  <150 mg/dLBorderline High:  150 - 199 mg/dL  . HDL 12/16/2016 49.40  >39.00 mg/dL Final  . VLDL 12/16/2016 18.0  0.0 - 40.0 mg/dL Final  . LDL Cholesterol 12/16/2016 45  0 - 99 mg/dL Final  . Total CHOL/HDL Ratio 12/16/2016 2   Final                  Men          Women1/2 Average Risk     3.4          3.3Average Risk          5.0          4.42X Average Risk           9.6          7.13X Average Risk          15.0          11.0                      . NonHDL 12/16/2016 62.79   Final   NOTE:  Non-HDL goal should be 30 mg/dL higher than patient's LDL goal (i.e. LDL goal of < 70 mg/dL, would have non-HDL goal of < 100 mg/dL)  . Total CK 12/16/2016 54  7 - 232 U/L Final    Assessment:  Bryan [name] is a 76 y.o. male with rheumatoid arthritis and progressive anemia over the past 2 years.  He has been on Plaquenil and hydralazine which can cause anemia.  Plaquenil was discontinued on 08/26/2016.  He denies any exposure to radiation or toxins.  He denies any prior history of hepatitis,  prior transfusions or HIV risk factors.  He denies any herbal products.  Diet is good.  EGD on 09/24/2016 revealed patchy candidiasis in the entire esophagus.  There were two non-bleeding angioectasias in the duodenum treated with argon plasma coagulation.  Gastritis was biopsied.  Pathology revealed mild chronic gastritis negative for H pylori, dysplasia and malignancy.  Colonoscopy on 09/24/2016 revealed diverticulosis in the entire colon, one 3 mm polyp in the cecum, and non-bleeding internal hemorrhoids.  Pathology from the cecal polyp revealed a tubular adenoma negative for high grade dysplasia and malignancy.    He denies any melena or hematochezia. He denies any hematuria.  CBC on 06/02/2016 revealed a hematocrit of 29.5, hemoglobin 10.0, MCV 92.3, platelets 155,000, and WBC 4200.  Stool was guaiac negative x 2 in 06/2016.  Labs on 06/04/2016 revealed a ferritin of 124, iron saturation 22% and TIBC of 370.  B12 was 883 on 06/18/2014.  Work-up on 07/19/2016 revealed a hematocrit of 31.7, hemoglobin 10.6, MCV 91, platelets 143,000, white count 3000 with an ANC of 1800.  Absolute lymphocyte count was 700 (low). Creatinine was 1.57.  LDH was 269 (98-192).  Normal labs included:  uric acid, folate, [place], SPEP, and copper.  Iron studies included a saturation of 11% (low) and a  TIBC of 454 (high) c/w iron deficiency anemia.  Sed rate was 19.  Retic was 0.9% (low).  He is on oral iron with vitamin C.  Chest, abdomen, and pelvic CTon 08/09/2016 revealed no acute findings or clear evidence of malignancy in the chest, abdomen or pelvis. There was asymmetric left rectal wall thickeningpossibly secondary to volume averaging with the prostate gland.  Bone marrow aspirate and biopsy on 11/04/2016 revealed a normocellular marrow (20%) with trilineage hematopoiesis and maturation.  There was mild megakaryocytic atypia.  Storage iron was present.  There were rare ringed sideroblasts.  Correlation with cytogenetics is recommended to exclude a low grade myelodysplastic syndrome with atypia. Cytogenetics were normal (46, XY).  He was admitted to [facility] on 10/14/2016 - 10/18/2016 for spinal stenosis of the lumbar region.  He underwent redo decompressive lumbar laminotomy L5-S1 with radical foraminotomy the L5 and S1 nerve root with complete medial facetectomies.  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  He has treated with Septra then switched to doxycycline.  Symptomatically, he has arthritis pain off Plaquenil.  He is a little stronger after completing his physical therapy.  He continues to have mild sweats.  Plan: 1.  Labs today:  CBC with diff, ferritin, iron studies, B12, hold tube. 2.  Discuss awaiting FISH studies on marrow.  Suspect will be normal.  Etiology of low counts likely secondary to chronic disease. Normal recent thyroid testing.  As patient previously on testosterone, check levels. 3.  Add testosterone. 4.  Follow-up pending FISH studies. 5.  RTC in 3 months for MD assess, labs (CBC with diff).   Lequita Asal, MD  12/17/2016, 2:13 PM

## 2016-12-18 ENCOUNTER — Encounter: Payer: Self-pay | Admitting: Hematology and Oncology

## 2016-12-18 LAB — TESTOSTERONE: Testosterone: <3 ng/dL — ABNORMAL LOW (ref 264–916)

## 2016-12-20 ENCOUNTER — Telehealth: Payer: Self-pay | Admitting: *Deleted

## 2016-12-20 ENCOUNTER — Telehealth: Payer: Self-pay | Admitting: Family Medicine

## 2016-12-20 DIAGNOSIS — R7989 Other specified abnormal findings of blood chemistry: Secondary | ICD-10-CM

## 2016-12-20 NOTE — Telephone Encounter (Signed)
Called patient to inform him that his testosterone is <3. Also informed him that a copy will be forwarded to PCP.

## 2016-12-20 NOTE — Telephone Encounter (Signed)
Please let the patient know that Dr. Enos Fling message regarding his testosterone being low on lab work. We'll need to recheck this. I have placed an order. This seems to be done at 8 AM. Thanks.

## 2016-12-20 NOTE — Telephone Encounter (Signed)
-----   Message from Lequita Asal, MD sent at 12/18/2016  9:26 AM EDT ----- Regarding: Please call patient and send testosterone result to PCP  Testosterone undetectable.  M  ----- Message ----- From: Interface, Lab In [place] Sent: 12/17/2016   1:47 PM To: Lequita Asal, MD

## 2016-12-20 NOTE — Telephone Encounter (Signed)
-----   Message from Shirlean Kelly, RN sent at 12/20/2016  2:45 PM EDT ----- Patient recently seen by Dr. Nolon Stalls, Oncologist @ [facility].  Dr. [name] asked that I send patient's lab results for your review.

## 2016-12-21 NOTE — Telephone Encounter (Signed)
Patient notified and scheduled 

## 2016-12-24 ENCOUNTER — Other Ambulatory Visit (INDEPENDENT_AMBULATORY_CARE_PROVIDER_SITE_OTHER): Payer: Medicare Other

## 2016-12-24 ENCOUNTER — Other Ambulatory Visit: Payer: Self-pay | Admitting: Cardiovascular Disease

## 2016-12-24 DIAGNOSIS — R7989 Other specified abnormal findings of blood chemistry: Secondary | ICD-10-CM | POA: Diagnosis not present

## 2016-12-24 LAB — TESTOSTERONE: Testosterone: 11.4 ng/dL — ABNORMAL LOW (ref 300.00–890.00)

## 2017-01-02 NOTE — Progress Notes (Signed)
2:18 PM   Bryan [name] Feb 01, 1941 [id number]  Referring provider: [name], MD [address] [place], [place] [address]  Chief Complaint  Patient presents with  . Hypogonadism    6 month follow up    HPI: Patient is a 76 year old  Caucasian  male with testosterone deficiency, erectile dysfunction and BPH with LUTS who presents today for a follow up.    Testosterone deficiency Patient is experiencing a decrease in libido, a lack of energy, a decrease in strength,  erections being less strong a recent deterioration in an ability to play sports, falling asleep after dinner and a recent deterioration in their work performance.  This is indicated by his responses to the ADAM questionnaire.  He is no longer having spontaneous erections.   He does not have sleep apnea. His pretreatment testosterone level was 26 ng/dL on 01/17/2014.  He is currently managing his testosterone deficiency with Testopel insertion.  His last insertion was on 06/2015.  His most recent testosterone level is 11.3 on 12/24/2016.     Androgen Deficiency in the Aging Male    [place] Name 01/03/17 1600         Androgen Deficiency in the Aging Male   Do you have a decrease in libido (sex drive) Yes     Do you have lack of energy Yes     Do you have a decrease in strength and/or endurance Yes     Have you lost height Yes     Have you noticed a decreased "enjoyment of life" Yes     Are you sad and/or grumpy Yes     Are your erections less strong Yes     Have you noticed a recent deterioration in your ability to play sports Yes     Are you falling asleep after dinner Yes     Has there been a recent deterioration in your work performance Yes       Erectile dysfunction His SHIM score is 9, which is moderate ED.   His previous SHIM score was 18.  He has been having difficulty with erections for the last several years.   His major complaint is achieving an erection.  His libido is diminished.    His risk factors for ED are age, BPH, testosterone deficiency, CAD and HTN.  He denies any painful erections or curvatures with his erections.        SHIM    Row Name 01/03/17 1601         SHIM: Over the last 6 months:   How do you rate your confidence that you could get and keep an erection? Low     When you had erections with sexual stimulation, how often were your erections hard enough for penetration (entering your partner)? A Few Times (much less than half the time)     During sexual intercourse, how often were you able to maintain your erection after you had penetrated (entered) your partner? A Few Times (much less than half the time)     During sexual intercourse, how difficult was it to maintain your erection to completion of intercourse? Very Difficult     When you attempted sexual intercourse, how often was it satisfactory for you? Almost Never or Never       SHIM Total Score   SHIM 9        Score: 1-7 Severe ED 8-11 Moderate ED 12-16 Mild-Moderate ED 17-21 Mild ED 22-25  No ED   BPH WITH LUTS His IPSS score today is 18, which is moderate lower urinary tract symptomatology. He is mostly dissatisfied with his quality life due to his urinary symptoms.  His PVR is 55 mL.  His previous IPSS score was 9/3.  His previous PVR is 28 mL.  His main complaints today are urinary urgency, frequency, nocturia and intermittency. He denies any dysuria, hematuria or suprapubic pain.  His has had laser bladder outlet procedure in 05/05/2013.  He also denies any recent fevers, chills, nausea or vomiting.   He does not have a family history of PCa.      IPSS    Row Name 01/03/17 1600         International Prostate Symptom Score   How often have you had the sensation of not emptying your bladder? Less than 1 in 5     How often have you had to urinate less than every two hours? More than half the time     How often have you found you stopped and started again several times when you  urinated? Almost always     How often have you found it difficult to postpone urination? More than half the time     How often have you had a weak urinary stream? Less than half the time     How often have you had to strain to start urination? Not at All     How many times did you typically get up at night to urinate? 2 Times     Total IPSS Score 18       Quality of Life due to urinary symptoms   If you were to spend the rest of your life with your urinary condition just the way it is now how would you feel about that? Mostly Disatisfied        Score:  1-7 Mild 8-19 Moderate 20-35 Severe     PMH: Past Medical History:  Diagnosis Date  . Abnormal CT scan    Asymmetric left rectal wall thickening   . Allergy   . Anemia   . Arrhythmia   . Chronic lower back pain   . Complication of anesthesia    Memory loss 09/2015  . Coronary artery disease   . GERD (gastroesophageal reflux disease)   . Glaucoma   . H/O thoracic aortic aneurysm repair   . History of being hospitalized    memory lose kidney funtion down blood pressure up  . History of blood transfusion    "think he had one when he had heart valve OR" (10/14/2016)  . History of chicken pox   . Hyperlipidemia   . Hypertension   . Lumbar stenosis   . Osteoarthritis    worse in feet and ankles  . Paroxysmal A-fib ([place])   . Rheumatoid arthritis ([place])    "all over" (10/14/2016)  . Valvular heart disease     Surgical History: Past Surgical History:  Procedure Laterality Date  . AORTIC VALVE REPLACEMENT  2007   [facility].  [place], [place]  . APPENDECTOMY  2010  . BACK SURGERY    . CARDIAC VALVE REPLACEMENT    . CATARACT EXTRACTION W/ INTRAOCULAR LENS  IMPLANT, BILATERAL Bilateral   . COLONOSCOPY WITH PROPOFOL N/A 09/24/2016   Procedure: COLONOSCOPY WITH PROPOFOL;  Surgeon: Jonathon Bellows, MD;  Location: [facility] ENDOSCOPY;  Service: Endoscopy;  Laterality: N/A;  . ESOPHAGOGASTRODUODENOSCOPY (EGD) WITH PROPOFOL N/A 09/24/2016     Procedure: ESOPHAGOGASTRODUODENOSCOPY (  EGD) WITH PROPOFOL;  Surgeon: Jonathon Bellows, MD;  Location: [facility] ENDOSCOPY;  Service: Endoscopy;  Laterality: N/A;  . GIVENS CAPSULE STUDY N/A 09/24/2016   Procedure: GIVENS CAPSULE STUDY;  Surgeon: Jonathon Bellows, MD;  Location: [facility] ENDOSCOPY;  Service: Endoscopy;  Laterality: N/A;  . HAMMER TOE SURGERY Right 10/09/2015   Procedure: HAMMER TOE REPAIR WITH K-WIRE FIXATION RIGHT SECOND TOE;  Surgeon: Albertine Patricia, DPM;  Location: [facility];  Service: Podiatry;  Laterality: Right;  WITH LOCAL  . JOINT REPLACEMENT Left 2008   knee  . LAPAROSCOPIC CHOLECYSTECTOMY  2010  . LUMBAR LAMINECTOMY/DECOMPRESSION MICRODISCECTOMY Right 03/08/2016   Procedure: Laminectomy and Foraminotomy - Lumbar Five-Sacral One Right;  Surgeon: Kary Kos, MD;  Location: [place];  Service: Neurosurgery;  Laterality: Right;  Right  . MULTIPLE TOOTH EXTRACTIONS     "3"  . POSTERIOR LUMBAR FUSION  10/14/2016   L5-S1  . REPLACEMENT TOTAL KNEE Left 2003  . THORACIC AORTIC ANEURYSM REPAIR  2010    Home Medications:  Allergies as of 01/03/2017      Reactions   Penicillins Hives, Swelling   SWELLING REACTION UNSPECIFIED PATIENT HAS TAKEN AMOXICILLIN ON MED HX FROM [facility] Has patient had a PCN reaction causing immediate rash, facial/tongue/throat swelling, SOB or lightheadedness with hypotension: Yes Has patient had a PCN reaction causing severe rash involving mucus membranes or skin necrosis: No Has patient had a PCN reaction that required hospitalization No Has patient had a PCN reaction occurring within the last 10 years: No If all of the above answers are "NO", then may proceed with   Demerol [meperidine] Hives, Nausea And Vomiting      Medication List       Accurate as of 01/03/17 11:59 PM. Always use your most recent med list.          amLODipine 5 MG tablet Commonly known as:  NORVASC TAKE ONE TABLET BY MOUTH DAILY   amoxicillin 500 MG capsule Commonly known as:   AMOXIL Take 2,000 mg by mouth as needed (prior to dental procedures). Reported on 11/17/2015   aspirin EC 81 MG tablet Take 81 mg by mouth daily.   atorvastatin 40 MG tablet Commonly known as:  LIPITOR Take 1 tablet (40 mg total) by mouth daily.   brimonidine 0.2 % ophthalmic solution Commonly known as:  ALPHAGAN Place 1 drop into both eyes every morning.   cetirizine 10 MG tablet Commonly known as:  ZYRTEC Take 10 mg by mouth daily.   cholecalciferol 400 units Tabs tablet Commonly known as:  VITAMIN D Take 400 Units by mouth daily.   donepezil 10 MG tablet Commonly known as:  ARICEPT Take 10 mg by mouth at bedtime.   fenofibrate 160 MG tablet TAKE ONE TABLET BY MOUTH EVERY DAY   fentaNYL 25 MCG/HR patch Commonly known as:  DURAGESIC - dosed mcg/hr Apply one patch to skin every 3 days if tolerated   ferrous sulfate 325 (65 FE) MG tablet Take 325 mg by mouth daily with breakfast.   FLUoxetine 20 MG capsule Commonly known as:  PROZAC TAKE 1 CAPSULE BY MOUTH DAILY   folic acid 1 MG tablet Commonly known as:  FOLVITE Take 1 mg by mouth 2 (two) times daily.   gabapentin 800 MG tablet Commonly known as:  NEURONTIN Limit 1 tablet by mouth 2-3 times per day if tolerated   HYDROcodone-acetaminophen 5-325 MG tablet Commonly known as:  NORCO/VICODIN Take 1 tablet every 6 hours by mouth per day (max 4  tablets per day) for breakthrough pain while wearing fentanyl patch if tolerated   hydrocortisone cream 1 % Apply 1 application topically daily as needed for itching.   lisinopril 20 MG tablet Commonly known as:  PRINIVIL,ZESTRIL TAKE ONE TABLET BY MOUTH EVERY EVENING   Magnesium 400 MG Caps Take 400 mg by mouth daily.   methotrexate 2.5 MG tablet Commonly known as:  RHEUMATREX Take 10 mg by mouth once a week.   omeprazole 40 MG capsule Commonly known as:  PRILOSEC Take 1 capsule (40 mg total) by mouth daily.   polyethylene glycol powder powder Commonly known  as:  GLYCOLAX/MIRALAX USE 1 CAPFUL DAILY IN 4-8 OUNCES OF LIQUID   predniSONE 5 MG tablet Commonly known as:  DELTASONE Take 5 mg by mouth daily as needed. Reported on 11/17/2015   tamsulosin 0.4 MG Caps capsule Commonly known as:  FLOMAX TAKE 1 CAPSULE EVERY DAY   traZODone 50 MG tablet Commonly known as:  DESYREL Take 100 mg by mouth at bedtime as needed for sleep. Reported on 10/09/2015   vitamin C 500 MG tablet Commonly known as:  ASCORBIC ACID Take 500 mg by mouth daily.       Allergies:  Allergies  Allergen Reactions  . Penicillins Hives and Swelling    SWELLING REACTION UNSPECIFIED PATIENT HAS TAKEN AMOXICILLIN ON MED HX FROM [facility] Has patient had a PCN reaction causing immediate rash, facial/tongue/throat swelling, SOB or lightheadedness with hypotension: Yes Has patient had a PCN reaction causing severe rash involving mucus membranes or skin necrosis: No Has patient had a PCN reaction that required hospitalization No Has patient had a PCN reaction occurring within the last 10 years: No If all of the above answers are "NO", then may proceed with  . Demerol [Meperidine] Hives and Nausea And Vomiting    Family History: Family History  Problem Relation Age of Onset  . Heart failure Mother   . Hypertension Mother   . Asthma Mother   . Heart attack Father 43       MI  . Hypertension Sister   . Prostate cancer Neg Hx   . Bladder Cancer Neg Hx   . Kidney cancer Neg Hx     Social History:  reports that he quit smoking about 35 years ago. His smoking use included Cigarettes. He has a 32.00 pack-year smoking history. He has never used smokeless tobacco. He reports that he drinks about 0.6 oz of alcohol per week . He reports that he does not use drugs.  ROS: UROLOGY Frequent Urination?: Yes Hard to postpone urination?: Yes Burning/pain with urination?: No Get up at night to urinate?: Yes Leakage of urine?: No Urine stream starts and stops?: Yes Trouble starting  stream?: No Do you have to strain to urinate?: No Blood in urine?: No Urinary tract infection?: No Sexually transmitted disease?: No Injury to kidneys or bladder?: No Painful intercourse?: No Weak stream?: No Erection problems?: Yes Penile pain?: No  Gastrointestinal Nausea?: No Vomiting?: No Indigestion/heartburn?: No Diarrhea?: No Constipation?: Yes  Constitutional Fever: No Night sweats?: Yes Weight loss?: Yes Fatigue?: Yes  Skin Skin rash/lesions?: No Itching?: Yes  Eyes Blurred vision?: No Double vision?: No  Ears/Nose/Throat Sore throat?: No Sinus problems?: No  Hematologic/Lymphatic Swollen glands?: No Easy bruising?: No  Cardiovascular Leg swelling?: No Chest pain?: No  Respiratory Cough?: No Shortness of breath?: No  Endocrine Excessive thirst?: No  Musculoskeletal Back pain?: No Joint pain?: Yes  Neurological Headaches?: No Dizziness?: No  Psychologic Depression?:  No Anxiety?: No  Physical Exam: BP 112/61   Pulse 71   Ht 6' (1.829 m)   Wt 180 lb 14.4 oz (82.1 kg)   BMI 24.53 kg/m   Constitutional: Well nourished. Alert and oriented, No acute distress. HEENT: [place] AT, moist mucus membranes. Trachea midline, no masses. Cardiovascular: No clubbing, cyanosis, or edema. Respiratory: Normal respiratory effort, no increased work of breathing. GI: Abdomen is soft, non tender, non distended, no abdominal masses. Liver and spleen not palpable.  No hernias appreciated.  Stool sample for occult testing is not indicated.   GU: No CVA tenderness.  No bladder fullness or masses.  Patient with uncircumcised phallus. Foreskin easily retracted Urethral meatus is patent.  No penile discharge. No penile lesions or rashes. Scrotum without lesions, cysts, rashes and/or edema.  Testicles are located scrotally bilaterally. No masses are appreciated in the testicles. Left and right epididymis are normal. Rectal: Patient with  normal sphincter tone. Anus  and perineum without scarring or rashes. No rectal masses are appreciated. Prostate is approximately 25 grams, no nodules are appreciated. Seminal vesicles are normal. Skin: No rashes, bruises or suspicious lesions. Lymph: No cervical or inguinal adenopathy. Neurologic: Grossly intact, no focal deficits, moving all 4 extremities. Psychiatric: Normal mood and affect.   Laboratory Data: Lab Results  Component Value Date   WBC 3.9 12/17/2016   HGB 10.0 (L) 12/17/2016   HCT 29.5 (L) 12/17/2016   MCV 90.0 12/17/2016   PLT 135 (L) 12/17/2016    Lab Results  Component Value Date   CREATININE 0.99 10/18/2016    PSA history:  <0.1 ng/mL on 01/17/2014    0.2 ng/mL on 08/07/2014    0.3 ng/mL on 02/17/2015    0.2 ng/mL on 07/09/2015  <0.2 ng/mL on 01/03/2017  Lab Results  Component Value Date   TESTOSTERONE 11.40 (L) 12/24/2016   Assessment & Plan:    1. Testosterone deficiency:   Patient's hypogonadism is managed with Testopel insertion.  He is doing well with this therapy would like to continue.  We will check with his insurance to see if this is still covered.    2. BPH with LUTS  - IPSS score is 18/4, it is worsening  - Continue conservative management, avoiding bladder irritants and timed voiding's  - most bothersome symptoms is/are frequency, urgency, nocturia and intermittency  - Continue tamsulosin 0.4 mg daily,   - Patient given Myrbetriq 25 mg daily, # 28 samples given, I have advised the patient of the side effects of Myrbetriq, such as: elevation in BP, urinary retention and/or HA.  - RTC in 3 weeks for I PSS and PVR   3. Erectile dysfunction:   SHIM score is 9, it is worse.  We will continue to monitor.  He will RTC in 6 months for a SHIM score and exam.     Return for RTC for Testopel.  Zara Council, [facility] [address], [place], [place] [address] [phone]

## 2017-01-03 ENCOUNTER — Ambulatory Visit (INDEPENDENT_AMBULATORY_CARE_PROVIDER_SITE_OTHER): Payer: Medicare Other | Admitting: Urology

## 2017-01-03 ENCOUNTER — Encounter: Payer: Self-pay | Admitting: Urology

## 2017-01-03 VITALS — BP 112/61 | HR 71 | Ht 72.0 in | Wt 180.9 lb

## 2017-01-03 DIAGNOSIS — N400 Enlarged prostate without lower urinary tract symptoms: Secondary | ICD-10-CM | POA: Diagnosis not present

## 2017-01-03 DIAGNOSIS — N529 Male erectile dysfunction, unspecified: Secondary | ICD-10-CM

## 2017-01-03 DIAGNOSIS — E291 Testicular hypofunction: Secondary | ICD-10-CM | POA: Diagnosis not present

## 2017-01-03 DIAGNOSIS — I2510 Atherosclerotic heart disease of native coronary artery without angina pectoris: Secondary | ICD-10-CM

## 2017-01-03 LAB — BLADDER SCAN AMB NON-IMAGING: Scan Result: 55

## 2017-01-04 LAB — PSA: Prostate Specific Ag, Serum: <0.1 ng/mL (ref 0.0–4.0)

## 2017-01-05 ENCOUNTER — Telehealth: Payer: Self-pay | Admitting: *Deleted

## 2017-01-05 DIAGNOSIS — E291 Testicular hypofunction: Secondary | ICD-10-CM

## 2017-01-05 NOTE — Telephone Encounter (Signed)
Spoke with patient and gave results. I let him know they we were investigating about his insurance and Testopel from what happened when he used to receive testopel about their payment. I did offer him per conversation with Larene Beach yesterday to receive Testosterone injections while we are figuring out the testopel. Patient states he is interested in the injections. I let him know that I would let Larene Beach know and someone will call him back. Patient ok with the plan.

## 2017-01-05 NOTE — Telephone Encounter (Signed)
-----   Message from Nori Riis, PA-C sent at 01/04/2017  8:01 AM EDT ----- Please let Mr. Marrs know that his PSA is undetectable.  We just need the information on his insurance inquiry before we proceed with Testopel.

## 2017-01-05 NOTE — Telephone Encounter (Signed)
We can call him in testosterone cypionate injections.  200 mg every two weeks.  Recheck testosterone level one week after his fourth injection.

## 2017-01-06 ENCOUNTER — Telehealth: Payer: Self-pay | Admitting: Urology

## 2017-01-06 MED ORDER — TESTOSTERONE CYPIONATE 200 MG/ML IM SOLN: 200.0000 mg | INTRAMUSCULAR | 0 refills | Status: DC

## 2017-01-06 NOTE — Addendum Note (Signed)
Addended by: Toniann Fail C on: 01/06/2017 10:04 AM   Modules accepted: Orders

## 2017-01-06 NOTE — Telephone Encounter (Signed)
LMOM- testosterone sent to pharmacy

## 2017-01-06 NOTE — Telephone Encounter (Signed)
Patient lm on my voicemail said he was returning your call  Sharyn Lull

## 2017-01-07 NOTE — Telephone Encounter (Signed)
Spoke with pt and made aware of testosterone being sent to pharmacy. Pt voiced understanding.

## 2017-01-10 ENCOUNTER — Ambulatory Visit (INDEPENDENT_AMBULATORY_CARE_PROVIDER_SITE_OTHER): Payer: Medicare Other

## 2017-01-10 ENCOUNTER — Telehealth: Payer: Self-pay | Admitting: Urology

## 2017-01-10 DIAGNOSIS — E291 Testicular hypofunction: Secondary | ICD-10-CM

## 2017-01-10 DIAGNOSIS — D709 Neutropenia, unspecified: Secondary | ICD-10-CM | POA: Diagnosis not present

## 2017-01-10 DIAGNOSIS — G8929 Other chronic pain: Secondary | ICD-10-CM | POA: Diagnosis not present

## 2017-01-10 DIAGNOSIS — N183 Chronic kidney disease, stage 3 (moderate): Secondary | ICD-10-CM | POA: Diagnosis not present

## 2017-01-10 DIAGNOSIS — M0579 Rheumatoid arthritis with rheumatoid factor of multiple sites without organ or systems involvement: Secondary | ICD-10-CM | POA: Diagnosis not present

## 2017-01-10 DIAGNOSIS — D693 Immune thrombocytopenic purpura: Secondary | ICD-10-CM | POA: Diagnosis not present

## 2017-01-10 DIAGNOSIS — Z79899 Other long term (current) drug therapy: Secondary | ICD-10-CM | POA: Diagnosis not present

## 2017-01-10 DIAGNOSIS — M25511 Pain in right shoulder: Secondary | ICD-10-CM | POA: Diagnosis not present

## 2017-01-10 MED ORDER — TESTOSTERONE CYPIONATE 200 MG/ML IM SOLN
200.0000 mg | Freq: Once | INTRAMUSCULAR | Status: AC
  Administered 2017-01-10: 200 mg via INTRAMUSCULAR

## 2017-01-10 NOTE — Telephone Encounter (Signed)
Returned patient's call in regards to scheduling his depo injection app. Advised him to call back to schedule with anyone here in the office at his convenience.   Sharyn Lull

## 2017-01-10 NOTE — Progress Notes (Signed)
Testosterone IM Injection  Due to Hypogonadism patient is present today for a Testosterone Injection.  Medication: Testosterone Cypionate Dose: 84mL Location: right upper outer buttocks Lot: [id number] Exp:30/20  Patient tolerated well, no complications were noted  Preformed by: Toniann Fail, LPN   Follow up: 2 weeks

## 2017-01-25 ENCOUNTER — Ambulatory Visit: Payer: Self-pay

## 2017-01-26 ENCOUNTER — Other Ambulatory Visit
Admission: RE | Admit: 2017-01-26 | Discharge: 2017-01-26 | Disposition: A | Discharge status: Home / Self Care | Payer: Medicare Other | Source: Ambulatory Visit | Attending: Gastroenterology | Admitting: Gastroenterology

## 2017-01-26 ENCOUNTER — Encounter: Payer: Self-pay | Admitting: Gastroenterology

## 2017-01-26 ENCOUNTER — Ambulatory Visit (INDEPENDENT_AMBULATORY_CARE_PROVIDER_SITE_OTHER): Payer: Medicare Other | Admitting: Gastroenterology

## 2017-01-26 ENCOUNTER — Encounter (INDEPENDENT_AMBULATORY_CARE_PROVIDER_SITE_OTHER): Payer: Self-pay

## 2017-01-26 ENCOUNTER — Ambulatory Visit (INDEPENDENT_AMBULATORY_CARE_PROVIDER_SITE_OTHER): Payer: Medicare Other

## 2017-01-26 VITALS — BP 103/62 | Ht 72.0 in | Wt 177.0 lb

## 2017-01-26 DIAGNOSIS — K7460 Unspecified cirrhosis of liver: Secondary | ICD-10-CM | POA: Insufficient documentation

## 2017-01-26 DIAGNOSIS — D696 Thrombocytopenia, unspecified: Secondary | ICD-10-CM

## 2017-01-26 DIAGNOSIS — I2510 Atherosclerotic heart disease of native coronary artery without angina pectoris: Secondary | ICD-10-CM | POA: Diagnosis not present

## 2017-01-26 DIAGNOSIS — R7689 Other specified abnormal immunological findings in serum: Secondary | ICD-10-CM

## 2017-01-26 DIAGNOSIS — R768 Other specified abnormal immunological findings in serum: Secondary | ICD-10-CM | POA: Insufficient documentation

## 2017-01-26 DIAGNOSIS — E291 Testicular hypofunction: Secondary | ICD-10-CM

## 2017-01-26 LAB — PROTIME-INR
INR: 1.01
Prothrombin Time: 13.2 seconds (ref 11.4–15.2)

## 2017-01-26 LAB — COMPREHENSIVE METABOLIC PANEL
ALT: 15 U/L — ABNORMAL LOW (ref 17–63)
AST: 24 U/L (ref 15–41)
Albumin: 4.7 g/dL (ref 3.5–5.0)
Alkaline Phosphatase: 67 U/L (ref 38–126)
Anion gap: 8 (ref 5–15)
BUN: 31 mg/dL — ABNORMAL HIGH (ref 6–20)
CO2: 27 mmol/L (ref 22–32)
Calcium: 9.2 mg/dL (ref 8.9–10.3)
Chloride: 102 mmol/L (ref 101–111)
Creatinine, Ser: 1.27 mg/dL — ABNORMAL HIGH (ref 0.61–1.24)
GFR calc Af Amer: >60 mL/min (ref >60)
GFR calc non Af Amer: 52 mL/min — ABNORMAL LOW (ref >60)
Glucose, Bld: 95 mg/dL (ref 65–99)
Potassium: 4 mmol/L (ref 3.5–5.1)
Sodium: 137 mmol/L (ref 135–145)
Total Bilirubin: 0.8 mg/dL (ref 0.3–1.2)
Total Protein: 7.7 g/dL (ref 6.5–8.1)

## 2017-01-26 MED ORDER — TESTOSTERONE CYPIONATE 200 MG/ML IM SOLN
200.0000 mg | Freq: Once | INTRAMUSCULAR | Status: AC
  Administered 2017-01-26: 200 mg via INTRAMUSCULAR

## 2017-01-26 NOTE — Progress Notes (Signed)
Jonathon Bellows MD, MRCP(U.K) [address]  [place]  [place], [place] [address]  Main: [phone]  Fax: [phone]   Primary Care Physician: [name], MD  Primary Gastroenterologist:  Dr. Jonathon Bellows   Chief Complaint  Patient presents with  . Hepatitis B    HPI: Bryan [name] is a 76 y.o. male    Summary of history : I have last seen the patient in 08/2016 for iron deficiency anemia. He does have a diagnosis of RA, worsening of anemia . During the process of work up a CT scan of the abdomen was performed on 08/09/16 and it showed no findings that were abnormal in the chest but assymetric left rectal wall thickening . Colonoscopy 09/2016 showed a small polyp-adenoma . 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 He subsequently had an incomplete capsule study as the capsule did not exit the stomach for over 6 hours, did not follow up after    Interval history   4//2018-  01/26/2017   He was subsequently seen by Dr [name] for his RA  - plan to consider to start him on a biological agent . 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 E antigen status negative, Core antibody total negative ,E antibody negative. Hepatitis C antibody is negative. No recent HIV test available.   He has never known to have hepatitis B, denies any tatoos, no [employer], denies any blood transfusion , no illegal drug use . 1 glass of wine every 2-3 days no higher than that .     Current Outpatient Prescriptions  Medication Sig Dispense Refill  . amLODipine (NORVASC) 5 MG tablet TAKE ONE TABLET BY MOUTH DAILY 90 tablet 1  . aspirin 325 MG tablet Take 325 mg by mouth daily.    Marland Kitchen atorvastatin (LIPITOR) 40 MG tablet Take 1 tablet (40 mg total) by mouth daily. 90 tablet 3  . brimonidine (ALPHAGAN) 0.2 % ophthalmic solution Place 1 drop into both eyes every morning.     . cetirizine (ZYRTEC) 10 MG tablet Take 10 mg by mouth  daily.    Marland Kitchen donepezil (ARICEPT) 10 MG tablet Take 10 mg by mouth at bedtime.    . fenofibrate 160 MG tablet TAKE ONE TABLET BY MOUTH EVERY DAY 90 tablet 0  . fentaNYL (DURAGESIC - DOSED MCG/HR) 25 MCG/HR patch Apply one patch to skin every 3 days if tolerated (Patient taking differently: Place 25 mcg onto the skin every 3 (three) days. Apply one patch to skin every 3 days if tolerated) 10 patch 0  . FLUoxetine (PROZAC) 20 MG capsule TAKE 1 CAPSULE BY MOUTH DAILY 90 capsule 1  . folic acid (FOLVITE) 1 MG tablet Take 1 mg by mouth 2 (two) times daily.    Marland Kitchen gabapentin (NEURONTIN) 800 MG tablet Limit 1 tablet by mouth 2-3 times per day if tolerated (Patient taking differently: Take 800 mg by mouth 3 (three) times daily. ) 270 tablet 0  . HYDROcodone-acetaminophen (NORCO/VICODIN) 5-325 MG tablet Take 1 tablet every 6 hours by mouth per day (max 4 tablets per day) for breakthrough pain while wearing fentanyl patch if tolerated 120 tablet 0  . hydrocortisone cream 1 % Apply 1 application topically daily as needed for itching.    . Magnesium 400 MG CAPS Take 400 mg by mouth daily.     Marland Kitchen omeprazole (PRILOSEC) 40 MG capsule Take 1 capsule (40 mg total) by mouth daily.  30 capsule 3  . polyethylene glycol powder (GLYCOLAX/MIRALAX) powder USE 1 CAPFUL DAILY IN 4-8 OUNCES OF LIQUID 3350 g 0  . tamsulosin (FLOMAX) 0.4 MG CAPS capsule TAKE 1 CAPSULE EVERY DAY 90 capsule 0  . testosterone cypionate (DEPOTESTOSTERONE CYPIONATE) 200 MG/ML injection Inject 1 mL (200 mg total) into the muscle every 14 (fourteen) days. 10 mL 0  . traZODone (DESYREL) 50 MG tablet Take 100 mg by mouth at bedtime as needed for sleep. Reported on 10/09/2015    . vitamin C (ASCORBIC ACID) 500 MG tablet Take 500 mg by mouth daily.    Marland Kitchen amoxicillin (AMOXIL) 500 MG capsule Take 2,000 mg by mouth as needed (prior to dental procedures). Reported on 11/17/2015    . cholecalciferol (VITAMIN D) 400 units TABS tablet Take 400 Units by mouth daily.    .  ferrous sulfate 325 (65 FE) MG tablet Take 325 mg by mouth daily with breakfast.    . gabapentin (NEURONTIN) 400 MG capsule     . lisinopril (PRINIVIL,ZESTRIL) 20 MG tablet TAKE ONE TABLET BY MOUTH EVERY EVENING (Patient not taking: Reported on 01/26/2017) 90 tablet 3  . methotrexate (RHEUMATREX) 2.5 MG tablet Take 10 mg by mouth once a week.     . predniSONE (DELTASONE) 5 MG tablet Take 5 mg by mouth daily as needed. Reported on 11/17/2015     Current Facility-Administered Medications  Medication Dose Route Frequency Provider Last Rate Last Dose  . lactated ringers infusion 1,000 mL  1,000 mL Intravenous Continuous Mohammed Kindle, MD 125 mL/hr at 01/19/16 1011 1,000 mL at 01/19/16 1011  . midazolam (VERSED) 5 MG/5ML injection 5 mg  5 mg Intravenous Once Mohammed Kindle, MD        Allergies as of 01/26/2017 - Review Complete 01/26/2017  Allergen Reaction Noted  . Penicillins Hives and Swelling 11/06/2010  . Demerol [meperidine] Hives and Nausea And Vomiting 11/27/2013    ROS:  General: Negative for anorexia, weight loss, fever, chills, fatigue, weakness. ENT: Negative for hoarseness, difficulty swallowing , nasal congestion. CV: Negative for chest pain, angina, palpitations, dyspnea on exertion, peripheral edema.  Respiratory: Negative for dyspnea at rest, dyspnea on exertion, cough, sputum, wheezing.  GI: See history of present illness. GU:  Negative for dysuria, hematuria, urinary incontinence, urinary frequency, nocturnal urination.  Endo: Negative for unusual weight change.    Physical Examination:   BP 103/62   Ht 6' (1.829 m)   Wt 177 lb (80.3 kg)   BMI 24.01 kg/m   General: Well-nourished, well-developed in no acute distress.  Eyes: No icterus. Conjunctivae pink. Mouth: Oropharyngeal mucosa moist and pink , no lesions erythema or exudate. Lungs: Clear to auscultation bilaterally. Non-labored. Heart: Regular rate and rhythm, no murmurs rubs or gallops.  Abdomen: Bowel  sounds are normal, nontender, nondistended, no hepatosplenomegaly or masses, no abdominal bruits or hernia , no rebound or guarding.   Extremities: No lower extremity edema. No clubbing or deformities. Neuro: Alert and oriented x 3.  Grossly intact. Skin: Warm and dry, no jaundice.   Psych: Alert and cooperative, normal mood and affect.   Imaging Studies: No results found.  Assessment and Plan:   Bryan [name] is a 76 y.o. y/o male here to see me today for a positive hepatitis B surface antigen which was checked prior to initiation of Anti TNF for his Rheumatoid arthritis. He has seen me in the past for anemia work up . He does have thrombocytopenia and may be related to possible  cirrhosis of the liver.    In HBsAg-positive patients, the frequency of HBV reactivation has ranged from 0 to 40 percent if started on Anti TNF. Based on risk stratification he is at moderate risk for hepatitis B reactivation and hence would recommend we start him on Tenofovir prior to initiation of Anti TNF. Treatment be maintained for at least 6 months after withdrawal of immunosuppression    Plan  1. Check Hepatic function panel , Hepatitis B viral load, HIV, Hepatitis Delta antigen . Once we know his HIV status( rule out co infection )  and Delta antigen status , will plan to start him on Tenofovir 300 mg once daily . Subsequently Anti TNF therapy can be commenced for his Rheumatoid arthritis.   2. RUQ USG to evaluate liver in setting of Hepatitis B, Check Hepatitis A immune status, get liver USG elastography to determine degree of hepatic fibrosis.   3. Iron deficiency anemia with incomplete capsule study - he is not keen on a repeat capsule study   Dr Jonathon Bellows  MD,MRCP [facility]) Follow up in 7-10 days to discuss results.

## 2017-01-26 NOTE — Addendum Note (Signed)
Addended by: Peggye Ley on: 01/26/2017 04:09 PM   Modules accepted: Orders

## 2017-01-26 NOTE — Addendum Note (Signed)
Addended by: Peggye Ley on: 01/26/2017 04:39 PM   Modules accepted: Orders

## 2017-01-26 NOTE — Progress Notes (Signed)
Testosterone IM Injection  Due to Hypogonadism patient is present today for a Testosterone Injection.  Medication: Testosterone Cypionate Dose: 10mL Location: left upper outer buttocks Lot: [id number] Exp:03/20  Patient tolerated well, no complications were noted  Preformed by: Toniann Fail, LPN   Follow up: 2 weeks. Pt stated that myrbetriq 25mg  did not work and requested the 50mg . Pt BP 131/78. Per Larene Beach 50mg  samples were given. Pt also filled out Testopel form and Ramona will be in contact with pt. Pt voiced understanding.

## 2017-01-27 ENCOUNTER — Ambulatory Visit
Admission: RE | Admit: 2017-01-27 | Discharge: 2017-01-27 | Disposition: A | Discharge status: Home / Self Care | Payer: Medicare Other | Source: Ambulatory Visit | Attending: Gastroenterology | Admitting: Gastroenterology

## 2017-01-27 ENCOUNTER — Ambulatory Visit: Admission: RE | Admit: 2017-01-27 | Payer: Medicare Other | Source: Ambulatory Visit

## 2017-01-27 DIAGNOSIS — R768 Other specified abnormal immunological findings in serum: Secondary | ICD-10-CM | POA: Insufficient documentation

## 2017-01-27 DIAGNOSIS — K7460 Unspecified cirrhosis of liver: Secondary | ICD-10-CM

## 2017-01-27 DIAGNOSIS — B181 Chronic viral hepatitis B without delta-agent: Secondary | ICD-10-CM | POA: Diagnosis not present

## 2017-01-27 DIAGNOSIS — Z9049 Acquired absence of other specified parts of digestive tract: Secondary | ICD-10-CM | POA: Insufficient documentation

## 2017-01-27 DIAGNOSIS — D696 Thrombocytopenia, unspecified: Secondary | ICD-10-CM | POA: Diagnosis not present

## 2017-01-27 LAB — HEPATITIS A ANTIBODY, TOTAL: Hep A Total Ab: NEGATIVE

## 2017-01-28 ENCOUNTER — Other Ambulatory Visit: Payer: Self-pay | Admitting: *Deleted

## 2017-01-28 DIAGNOSIS — D696 Thrombocytopenia, unspecified: Secondary | ICD-10-CM

## 2017-01-28 LAB — HEPATITIS B DNA, ULTRAQUANTITATIVE, PCR
HBV DNA SERPL PCR-ACNC: NOT DETECTED IU/mL
HBV DNA SERPL PCR-LOG IU: UNDETERMINED log10 IU/mL

## 2017-01-31 ENCOUNTER — Ambulatory Visit (INDEPENDENT_AMBULATORY_CARE_PROVIDER_SITE_OTHER): Payer: Medicare Other | Admitting: Gastroenterology

## 2017-01-31 ENCOUNTER — Encounter: Payer: Self-pay | Admitting: Gastroenterology

## 2017-01-31 VITALS — BP 145/65 | HR 58 | Temp 97.9°F | Ht 72.0 in | Wt 179.4 lb

## 2017-01-31 DIAGNOSIS — I2510 Atherosclerotic heart disease of native coronary artery without angina pectoris: Secondary | ICD-10-CM | POA: Diagnosis not present

## 2017-01-31 DIAGNOSIS — Z114 Encounter for screening for human immunodeficiency virus [HIV]: Secondary | ICD-10-CM

## 2017-01-31 DIAGNOSIS — B181 Chronic viral hepatitis B without delta-agent: Secondary | ICD-10-CM | POA: Diagnosis not present

## 2017-01-31 NOTE — Progress Notes (Signed)
Jonathon Bellows MD, MRCP(U.K) [address]  [place]  [place], [place] [address]  Main: [phone]  Fax: [phone]   Primary Care Physician: [name], MD  Primary Gastroenterologist:  Dr. Jonathon Bellows   Chief Complaint  Patient presents with  . Elevated Alkaline Phosphatase    HPI: Bryan [name] is a 76 y.o. male   Summary of history : Seen him earlier in 2018 for iron deficiency anemia. He does have a diagnosis of RA, worsening of anemia .  Colonoscopy 09/2016 showed a small polyp-adenoma . 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 He subsequently had an incomplete capsule study as the capsule did not exit the stomach for over 6 hours, did not follow up after  He was subsequently seen by Dr [name] for his RA  - plan to consider to start him on a biological agent . 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 E antigen status negative, Core antibody total negative ,E antibody negative. Hepatitis C antibody is negative. No recent HIV test available. He has never known to have hepatitis B, denies any tatoos, no [employer], denies any blood transfusion , no illegal drug use . 1 glass of wine every 2-3 days no higher than that  Interval history   01/26/2017 -01/31/17   Labs 01/26/17- GFR 52, HBV DNA not detected ,Hep A ab -negative,INR 1.01 , Liver elastrography F0/F1, normal liver parenchyma.  Did not get the HIV test done as it was not covered by insurance for some reason    Current Outpatient Prescriptions  Medication Sig Dispense Refill  . amLODipine (NORVASC) 5 MG tablet TAKE ONE TABLET BY MOUTH DAILY 90 tablet 1  . aspirin 325 MG tablet Take 325 mg by mouth daily.    Marland Kitchen atorvastatin (LIPITOR) 40 MG tablet Take 1 tablet (40 mg total) by mouth daily. 90 tablet 3  . brimonidine (ALPHAGAN) 0.2 % ophthalmic solution Place 1 drop into both eyes every morning.     . cetirizine (ZYRTEC) 10 MG tablet  Take 10 mg by mouth daily.    . cholecalciferol (VITAMIN D) 400 units TABS tablet Take 400 Units by mouth daily.    Marland Kitchen donepezil (ARICEPT) 10 MG tablet Take 10 mg by mouth at bedtime.    . fenofibrate 160 MG tablet TAKE ONE TABLET BY MOUTH EVERY DAY 90 tablet 0  . fentaNYL (DURAGESIC - DOSED MCG/HR) 25 MCG/HR patch Apply one patch to skin every 3 days if tolerated (Patient taking differently: Place 25 mcg onto the skin every 3 (three) days. Apply one patch to skin every 3 days if tolerated) 10 patch 0  . ferrous sulfate 325 (65 FE) MG tablet Take 325 mg by mouth daily with breakfast.    . FLUoxetine (PROZAC) 20 MG capsule TAKE 1 CAPSULE BY MOUTH DAILY 90 capsule 1  . folic acid (FOLVITE) 1 MG tablet Take 1 mg by mouth 2 (two) times daily.    Marland Kitchen gabapentin (NEURONTIN) 400 MG capsule     . HYDROcodone-acetaminophen (NORCO/VICODIN) 5-325 MG tablet Take 1 tablet every 6 hours by mouth per day (max 4 tablets per day) for breakthrough pain while wearing fentanyl patch if tolerated 120 tablet 0  . hydrocortisone cream 1 % Apply 1 application topically daily as needed for itching.    Marland Kitchen lisinopril (PRINIVIL,ZESTRIL) 20 MG tablet TAKE ONE TABLET BY MOUTH EVERY EVENING 90 tablet 3  . Magnesium 400 MG CAPS  Take 400 mg by mouth daily.     . methotrexate (RHEUMATREX) 2.5 MG tablet Take 10 mg by mouth once a week.     Marland Kitchen omeprazole (PRILOSEC) 40 MG capsule Take 1 capsule (40 mg total) by mouth daily. 30 capsule 3  . polyethylene glycol powder (GLYCOLAX/MIRALAX) powder USE 1 CAPFUL DAILY IN 4-8 OUNCES OF LIQUID 3350 g 0  . predniSONE (DELTASONE) 5 MG tablet Take 5 mg by mouth daily as needed. Reported on 11/17/2015    . tamsulosin (FLOMAX) 0.4 MG CAPS capsule TAKE 1 CAPSULE EVERY DAY 90 capsule 0  . testosterone cypionate (DEPOTESTOSTERONE CYPIONATE) 200 MG/ML injection Inject 1 mL (200 mg total) into the muscle every 14 (fourteen) days. 10 mL 0  . traZODone (DESYREL) 50 MG tablet Take 100 mg by mouth at bedtime as  needed for sleep. Reported on 10/09/2015    . vitamin C (ASCORBIC ACID) 500 MG tablet Take 500 mg by mouth daily.     Current Facility-Administered Medications  Medication Dose Route Frequency Provider Last Rate Last Dose  . lactated ringers infusion 1,000 mL  1,000 mL Intravenous Continuous Mohammed Kindle, MD 125 mL/hr at 01/19/16 1011 1,000 mL at 01/19/16 1011  . midazolam (VERSED) 5 MG/5ML injection 5 mg  5 mg Intravenous Once Mohammed Kindle, MD        Allergies as of 01/31/2017 - Review Complete 01/31/2017  Allergen Reaction Noted  . Penicillins Hives and Swelling 11/06/2010  . Demerol [meperidine] Hives and Nausea And Vomiting 11/27/2013    ROS:  General: Negative for anorexia, weight loss, fever, chills, fatigue, weakness. ENT: Negative for hoarseness, difficulty swallowing , nasal congestion. CV: Negative for chest pain, angina, palpitations, dyspnea on exertion, peripheral edema.  Respiratory: Negative for dyspnea at rest, dyspnea on exertion, cough, sputum, wheezing.  GI: See history of present illness. GU:  Negative for dysuria, hematuria, urinary incontinence, urinary frequency, nocturnal urination.  Endo: Negative for unusual weight change.    Physical Examination:   BP (!) 145/65 (BP Location: Left Arm, Patient Position: Sitting, Cuff Size: Normal)   Pulse (!) 58   Temp 97.9 F (36.6 C) (Oral)   Ht 6' (1.829 m)   Wt 179 lb 6.4 oz (81.4 kg)   BMI 24.33 kg/m   General: Well-nourished, well-developed in no acute distress.  Eyes: No icterus. Conjunctivae pink. Mouth: Oropharyngeal mucosa moist and pink , no lesions erythema or exudate. Lungs: Clear to auscultation bilaterally. Non-labored. Heart: Regular rate and rhythm, no murmurs rubs or gallops.  Abdomen: Bowel sounds are normal, nontender, nondistended, no hepatosplenomegaly or masses, no abdominal bruits or hernia , no rebound or guarding.   Extremities: No lower extremity edema. No clubbing or  deformities. Neuro: Alert and oriented x 3.  Grossly intact. Skin: Warm and dry, no jaundice.   Psych: Alert and cooperative, normal mood and affect.   Imaging Studies: [place] Arfi Elastography Add On  Result Date: 01/27/2017 CLINICAL DATA:  Chronic hepatitis-B. Hepatic cirrhosis. Thrombocytopenia. EXAM: US ABDOMEN LIMITED - RIGHT UPPER QUADRANT ULTRASOUND HEPATIC ELASTOGRAPHY TECHNIQUE: Limited right upper quadrant abdominal ultrasound was performed. In addition, ultrasound elastography evaluation of the liver was performed. A region of interest was placed in the right lobe of the liver. Following application of a compressive sonographic pulse, shear waves were detected in the adjacent hepatic tissue and the shear wave velocity was calculated. Multiple assessments were performed at the selected site. Median shear wave velocity is correlated to a Metavir fibrosis score. COMPARISON:  CT on  08/09/2016 FINDINGS: ULTRASOUND ABDOMEN LIMITED RIGHT UPPER QUADRANT Gallbladder: Surgically absent. Common bile duct: Diameter: 8 mm, within normal limits status post cholecystectomy. Liver: No focal lesion identified. Within normal limits in parenchymal echogenicity. Portal vein is patent on color Doppler imaging with normal direction of blood flow towards the liver. ULTRASOUND HEPATIC ELASTOGRAPHY Device: Siemens Helix VTQ Patient position: Supine Transducer 6C1 Number of measurements: 10 Hepatic segment:  8 Median velocity:   0.76  m/sec IQR: 0.17 IQR/Median velocity ratio: 0.22 Corresponding Metavir fibrosis score:  F0/F1 Risk of fibrosis: Minimal Limitations of exam: None Pertinent findings noted on other imaging exams:  None Please note that abnormal shear wave velocities may also be identified in clinical settings other than with hepatic fibrosis, such as: acute hepatitis, elevated right heart and central venous pressures including use of beta blockers, veno-occlusive disease (Budd-Chiari), infiltrative processes such  as mastocytosis/amyloidosis/infiltrative tumor, extrahepatic cholestasis, in the post-prandial state, and liver transplantation. Correlation with patient history, laboratory data, and clinical condition recommended. IMPRESSION: ULTRASOUND ABDOMEN: Unremarkable sonographic appearance of liver. Prior cholecystectomy. No evidence of biliary ductal dilatation. ULTRASOUND HEPATIC ELASTOGRAHY: Median hepatic shear wave velocity is calculated at 0.76 m/sec. Corresponding Metavir fibrosis score is  F0/F1. Risk of fibrosis is Minimal. Follow-up: None required Electronically Signed   By: Earle Gell M.D.   On: 01/27/2017 10:17   US Abdomen Limited Ruq  Result Date: 01/27/2017 CLINICAL DATA:  Chronic hepatitis-B. Hepatic cirrhosis. Thrombocytopenia. EXAM: US ABDOMEN LIMITED - RIGHT UPPER QUADRANT ULTRASOUND HEPATIC ELASTOGRAPHY TECHNIQUE: Limited right upper quadrant abdominal ultrasound was performed. In addition, ultrasound elastography evaluation of the liver was performed. A region of interest was placed in the right lobe of the liver. Following application of a compressive sonographic pulse, shear waves were detected in the adjacent hepatic tissue and the shear wave velocity was calculated. Multiple assessments were performed at the selected site. Median shear wave velocity is correlated to a Metavir fibrosis score. COMPARISON:  CT on 08/09/2016 FINDINGS: ULTRASOUND ABDOMEN LIMITED RIGHT UPPER QUADRANT Gallbladder: Surgically absent. Common bile duct: Diameter: 8 mm, within normal limits status post cholecystectomy. Liver: No focal lesion identified. Within normal limits in parenchymal echogenicity. Portal vein is patent on color Doppler imaging with normal direction of blood flow towards the liver. ULTRASOUND HEPATIC ELASTOGRAPHY Device: Siemens Helix VTQ Patient position: Supine Transducer 6C1 Number of measurements: 10 Hepatic segment:  8 Median velocity:   0.76  m/sec IQR: 0.17 IQR/Median velocity ratio: 0.22  Corresponding Metavir fibrosis score:  F0/F1 Risk of fibrosis: Minimal Limitations of exam: None Pertinent findings noted on other imaging exams:  None Please note that abnormal shear wave velocities may also be identified in clinical settings other than with hepatic fibrosis, such as: acute hepatitis, elevated right heart and central venous pressures including use of beta blockers, veno-occlusive disease (Budd-Chiari), infiltrative processes such as mastocytosis/amyloidosis/infiltrative tumor, extrahepatic cholestasis, in the post-prandial state, and liver transplantation. Correlation with patient history, laboratory data, and clinical condition recommended. IMPRESSION: ULTRASOUND ABDOMEN: Unremarkable sonographic appearance of liver. Prior cholecystectomy. No evidence of biliary ductal dilatation. ULTRASOUND HEPATIC ELASTOGRAHY: Median hepatic shear wave velocity is calculated at 0.76 m/sec. Corresponding Metavir fibrosis score is  F0/F1. Risk of fibrosis is Minimal. Follow-up: None required Electronically Signed   By: Earle Gell M.D.   On: 01/27/2017 10:17    Assessment and Plan:   Bryan [name] is a 76 y.o. y/o male here to see me today to follow up for a positive hepatitis B surface antigen which was checked  prior to initiation of Anti TNF for his Rheumatoid arthritis. He has seen me in the past for anemia work up . Based on risk stratification he is at moderate risk for hepatitis B reactivation and hence would recommend we start him on Tenofovir prior to initiation of Anti TNF. Treatment be maintained for at least 6 months after withdrawal of immunosuppression    Plan   HIV test   Hepatitis Delta antigen f/u result     1. . Once we know his HIV status( rule out co infection )  and Delta antigen status , will plan to start him on Tenofovir 300 mg once daily . Subsequently Anti TNF therapy can be commenced for his Rheumatoid arthritis.   2.  Iron deficiency anemia with incomplete  capsule study - he is not keen on a repeat capsule study   Dr Jonathon Bellows  MD,MRCP [facility]) Follow up in 4 weeks

## 2017-01-31 NOTE — Addendum Note (Signed)
Addended by: Peggye Ley on: 01/31/2017 10:56 AM   Modules accepted: Orders

## 2017-02-02 ENCOUNTER — Ambulatory Visit: Payer: Medicare Other | Admitting: Gastroenterology

## 2017-02-02 ENCOUNTER — Telehealth: Payer: Self-pay | Admitting: *Deleted

## 2017-02-02 LAB — MISC LABCORP TEST (SEND OUT): Labcorp test code: 820201

## 2017-02-02 NOTE — Telephone Encounter (Signed)
Spoke with Bryan [name] about Medicare and Testopel. I let him know from a phone call with [name] at [facility] they do not prior authorize procedures and that if they deem it medically necessary they will pay and if not they wont pay. Patient states he can not take that chance and will stay on the injections. Patient ok with the plan.

## 2017-02-03 ENCOUNTER — Telehealth: Payer: Self-pay

## 2017-02-03 ENCOUNTER — Other Ambulatory Visit
Admission: RE | Admit: 2017-02-03 | Discharge: 2017-02-03 | Disposition: A | Discharge status: Home / Self Care | Payer: Medicare Other | Source: Ambulatory Visit | Attending: Gastroenterology | Admitting: Gastroenterology

## 2017-02-03 ENCOUNTER — Other Ambulatory Visit: Payer: Self-pay

## 2017-02-03 DIAGNOSIS — Z114 Encounter for screening for human immunodeficiency virus [HIV]: Secondary | ICD-10-CM

## 2017-02-03 NOTE — Telephone Encounter (Signed)
LVM for patient to callback concerning HIV lab.

## 2017-02-04 LAB — HIV ANTIBODY (ROUTINE TESTING W REFLEX): HIV Screen 4th Generation wRfx: NONREACTIVE

## 2017-02-08 ENCOUNTER — Other Ambulatory Visit: Payer: Self-pay

## 2017-02-08 ENCOUNTER — Telehealth: Payer: Self-pay

## 2017-02-08 DIAGNOSIS — M48062 Spinal stenosis, lumbar region with neurogenic claudication: Secondary | ICD-10-CM | POA: Diagnosis not present

## 2017-02-08 DIAGNOSIS — B181 Chronic viral hepatitis B without delta-agent: Secondary | ICD-10-CM

## 2017-02-08 DIAGNOSIS — M5126 Other intervertebral disc displacement, lumbar region: Secondary | ICD-10-CM | POA: Diagnosis not present

## 2017-02-08 DIAGNOSIS — B182 Chronic viral hepatitis C: Secondary | ICD-10-CM

## 2017-02-08 MED ORDER — TENOFOVIR DISOPROXIL FUMARATE 300 MG PO TABS: 300.0000 mg | ORAL_TABLET | Freq: Every day | ORAL | Status: DC

## 2017-02-08 MED ORDER — TENOFOVIR DISOPROXIL FUMARATE 300 MG PO TABS
300.0000 mg | ORAL_TABLET | Freq: Every day | ORAL | 2 refills | Status: AC
Start: 2017-02-08 — End: 2017-05-09

## 2017-02-08 NOTE — Telephone Encounter (Signed)
LVM for patient callback. Rx for Tenofovir 300mg  sent to pharmacy.  Relay instructions per Dr. Vicente Males.  -medication to be taken until 6 months after Anti-TNF medication has been completed.  -Anti-TNF medication to be provided by Dr. Caryl Bis or Percell Boston.  Suggest Hep A vaccine to be obtained at Dr. Caryl Bis.

## 2017-02-09 ENCOUNTER — Ambulatory Visit (INDEPENDENT_AMBULATORY_CARE_PROVIDER_SITE_OTHER): Payer: Medicare Other

## 2017-02-09 DIAGNOSIS — E291 Testicular hypofunction: Secondary | ICD-10-CM

## 2017-02-09 MED ORDER — TESTOSTERONE CYPIONATE 200 MG/ML IM SOLN
200.0000 mg | Freq: Once | INTRAMUSCULAR | Status: AC
  Administered 2017-02-09: 200 mg via INTRAMUSCULAR

## 2017-02-09 NOTE — Progress Notes (Signed)
Testosterone IM Injection  Due to Hypogonadism patient is present today for a Testosterone Injection.  Medication: Testosterone Cypionate Dose: 27mL Location: left upper outer buttocks Lot: [id number] Exp:01/20  Patient tolerated well, no complications were noted  Preformed by: Toniann Fail, LPN   Follow up: Pt came up to the front desk asking why he had to wait so long for a simple injection, in a very rude manner. Front desk made pt aware he was next and requested his medication. Medication, two 75mL vials, were given to nurse and injection was given. After the injection pt became very irrate stating he wanted his medication back and that he was going to either inject himself or have a friend do it. Reinforced with pt that if he receives testosterone cypionate from our office that it is our policy that he have his injections given by [facility]. Pt stated that he was not going to keep coming to this office and waiting every time for just a simple injection. Pt stated that he has to wait at least 10 minutes every time he comes and that is unacceptable. Reinforced with pt that he is on the nurse schedule along with other people, but would ask Larene Beach about his injections. Larene Beach stated that if he wants testosterone he has to have his injections by [facility]. Once again made pt aware would have to get injections by [facility] or would not be able to continue testosterone therapy. Pt stood up and stated "I will just find another damn urologist. Make sure you call the pharmacy and cancel all of my refills for this testosterone!" As pt was walking out of the office he was yelling down the hall to make sure Larene Beach knew to cancel his refills. Angie attempted to get pt to check out and pt yelled "I am not checking out and I will not be back to this office!"

## 2017-02-15 ENCOUNTER — Inpatient Hospital Stay: Payer: Medicare Other | Attending: Hematology and Oncology

## 2017-02-15 ENCOUNTER — Other Ambulatory Visit: Payer: Self-pay | Admitting: Neurosurgery

## 2017-02-15 DIAGNOSIS — I10 Essential (primary) hypertension: Secondary | ICD-10-CM | POA: Diagnosis not present

## 2017-02-15 DIAGNOSIS — M5416 Radiculopathy, lumbar region: Secondary | ICD-10-CM

## 2017-02-15 DIAGNOSIS — M5137 Other intervertebral disc degeneration, lumbosacral region: Secondary | ICD-10-CM | POA: Diagnosis not present

## 2017-02-16 ENCOUNTER — Ambulatory Visit
Admission: RE | Admit: 2017-02-16 | Discharge: 2017-02-16 | Disposition: A | Discharge status: Home / Self Care | Payer: Medicare Other | Source: Ambulatory Visit | Attending: Neurosurgery | Admitting: Neurosurgery

## 2017-02-16 DIAGNOSIS — M4856XA Collapsed vertebra, not elsewhere classified, lumbar region, initial encounter for fracture: Secondary | ICD-10-CM | POA: Diagnosis not present

## 2017-02-16 DIAGNOSIS — M5416 Radiculopathy, lumbar region: Secondary | ICD-10-CM

## 2017-02-16 DIAGNOSIS — M48061 Spinal stenosis, lumbar region without neurogenic claudication: Secondary | ICD-10-CM | POA: Insufficient documentation

## 2017-02-16 DIAGNOSIS — M5116 Intervertebral disc disorders with radiculopathy, lumbar region: Secondary | ICD-10-CM | POA: Diagnosis not present

## 2017-02-16 DIAGNOSIS — M5126 Other intervertebral disc displacement, lumbar region: Secondary | ICD-10-CM | POA: Diagnosis not present

## 2017-02-16 MED ORDER — GADOBENATE DIMEGLUMINE 529 MG/ML IV SOLN: 20.0000 mL | Freq: Once | INTRAVENOUS | Status: DC | PRN

## 2017-02-16 MED ORDER — GADOBENATE DIMEGLUMINE 529 MG/ML IV SOLN
15.0000 mL | Freq: Once | INTRAVENOUS | Status: AC | PRN
  Administered 2017-02-16: 15 mL via INTRAVENOUS

## 2017-02-21 ENCOUNTER — Ambulatory Visit: Payer: Medicare Other | Admitting: Gastroenterology

## 2017-02-22 DIAGNOSIS — I10 Essential (primary) hypertension: Secondary | ICD-10-CM | POA: Diagnosis not present

## 2017-02-22 DIAGNOSIS — M5416 Radiculopathy, lumbar region: Secondary | ICD-10-CM | POA: Diagnosis not present

## 2017-02-23 DIAGNOSIS — M5416 Radiculopathy, lumbar region: Secondary | ICD-10-CM | POA: Diagnosis not present

## 2017-02-26 ENCOUNTER — Other Ambulatory Visit: Payer: Self-pay | Admitting: Gastroenterology

## 2017-02-26 DIAGNOSIS — K219 Gastro-esophageal reflux disease without esophagitis: Secondary | ICD-10-CM

## 2017-02-28 ENCOUNTER — Other Ambulatory Visit: Payer: Self-pay | Admitting: Neurosurgery

## 2017-02-28 ENCOUNTER — Ambulatory Visit
Admission: RE | Admit: 2017-02-28 | Discharge: 2017-02-28 | Disposition: A | Discharge status: Home / Self Care | Payer: Medicare Other | Source: Ambulatory Visit | Attending: Neurosurgery | Admitting: Neurosurgery

## 2017-02-28 DIAGNOSIS — M4327 Fusion of spine, lumbosacral region: Secondary | ICD-10-CM | POA: Diagnosis not present

## 2017-02-28 DIAGNOSIS — M5416 Radiculopathy, lumbar region: Secondary | ICD-10-CM

## 2017-02-28 DIAGNOSIS — D696 Thrombocytopenia, unspecified: Secondary | ICD-10-CM | POA: Insufficient documentation

## 2017-02-28 DIAGNOSIS — M4856XA Collapsed vertebra, not elsewhere classified, lumbar region, initial encounter for fracture: Secondary | ICD-10-CM | POA: Diagnosis not present

## 2017-02-28 DIAGNOSIS — M48061 Spinal stenosis, lumbar region without neurogenic claudication: Secondary | ICD-10-CM | POA: Diagnosis not present

## 2017-02-28 DIAGNOSIS — M5126 Other intervertebral disc displacement, lumbar region: Secondary | ICD-10-CM | POA: Diagnosis not present

## 2017-03-01 DIAGNOSIS — S32040A Wedge compression fracture of fourth lumbar vertebra, initial encounter for closed fracture: Secondary | ICD-10-CM | POA: Diagnosis not present

## 2017-03-02 ENCOUNTER — Telehealth: Payer: Self-pay | Admitting: Radiology

## 2017-03-02 ENCOUNTER — Other Ambulatory Visit: Payer: Self-pay | Admitting: Neurosurgery

## 2017-03-02 ENCOUNTER — Other Ambulatory Visit: Payer: Medicare Other

## 2017-03-02 DIAGNOSIS — M545 Low back pain: Principal | ICD-10-CM

## 2017-03-02 DIAGNOSIS — G8929 Other chronic pain: Secondary | ICD-10-CM

## 2017-03-02 NOTE — Telephone Encounter (Signed)
FYI. Pt was scheduled for a lab visit today to come in for labs that were placed by Dr. Jobe Igo for a CBC. Explained to pt that the provider who ordered the CBC ordered the test as a hospital encounter and the lab staff at [facility] is unable to release that order. Pt stated that he told the person he talked to at [facility] that he wanted it drawn here. Explained to patient again that the way the lab was ordered as a hospital encounter, it could not be released by the lab staff at [facility]. Asked pt for the number to [facility] to try and resolve issue and get another order placed for lab to be drawn here. Called the number pt gave for [facility] at [phone] and was forwarded to an automatic voicemail that the office was closed.  Advised pt that he could have the lab drawn at [facility] since it was placed as a hospital encounter, Spoke with Dr. Caryl Bis about issue. Per Dr. Caryl Bis a CBC order would not be placed by him and the pt could go to the hospital to have it done. Pt was very irate and yelling at lab staff. Pt yelled "well who's fault is it then?!" "I'm not [place] to get my labs drawn." RN Team Lead, PCP, and Admin Team Lead made aware of situation.

## 2017-03-03 ENCOUNTER — Other Ambulatory Visit (HOSPITAL_COMMUNITY): Payer: Self-pay | Admitting: Radiology

## 2017-03-03 ENCOUNTER — Ambulatory Visit
Admission: RE | Admit: 2017-03-03 | Discharge: 2017-03-03 | Disposition: A | Discharge status: Home / Self Care | Payer: Medicare Other | Source: Ambulatory Visit | Attending: Neurosurgery | Admitting: Neurosurgery

## 2017-03-03 ENCOUNTER — Other Ambulatory Visit
Admission: RE | Admit: 2017-03-03 | Discharge: 2017-03-03 | Disposition: A | Discharge status: Home / Self Care | Payer: Medicare Other | Source: Ambulatory Visit | Attending: Radiology | Admitting: Radiology

## 2017-03-03 ENCOUNTER — Other Ambulatory Visit: Payer: Self-pay | Admitting: Neurosurgery

## 2017-03-03 VITALS — BP 174/76 | HR 56 | Temp 97.7°F | Resp 21

## 2017-03-03 DIAGNOSIS — M533 Sacrococcygeal disorders, not elsewhere classified: Secondary | ICD-10-CM

## 2017-03-03 DIAGNOSIS — Z09 Encounter for follow-up examination after completed treatment for conditions other than malignant neoplasm: Secondary | ICD-10-CM

## 2017-03-03 DIAGNOSIS — D696 Thrombocytopenia, unspecified: Secondary | ICD-10-CM | POA: Diagnosis not present

## 2017-03-03 DIAGNOSIS — M545 Low back pain: Secondary | ICD-10-CM

## 2017-03-03 DIAGNOSIS — M4856XA Collapsed vertebra, not elsewhere classified, lumbar region, initial encounter for fracture: Secondary | ICD-10-CM | POA: Diagnosis not present

## 2017-03-03 DIAGNOSIS — G8929 Other chronic pain: Secondary | ICD-10-CM

## 2017-03-03 LAB — CBC
HCT: 36 % — ABNORMAL LOW (ref 40.0–52.0)
Hemoglobin: 12.2 g/dL — ABNORMAL LOW (ref 13.0–18.0)
MCH: 30.7 pg (ref 26.0–34.0)
MCHC: 33.9 g/dL (ref 32.0–36.0)
MCV: 90.4 fL (ref 80.0–100.0)
Platelets: 164 10*3/uL (ref 150–440)
RBC: 3.99 MIL/uL — ABNORMAL LOW (ref 4.40–5.90)
RDW: 14.4 % (ref 11.5–14.5)
WBC: 4.2 10*3/uL (ref 3.8–10.6)

## 2017-03-03 MED ORDER — KETOROLAC TROMETHAMINE 30 MG/ML IJ SOLN
30.0000 mg | Freq: Once | INTRAMUSCULAR | Status: AC
  Administered 2017-03-03: 30 mg via INTRAVENOUS

## 2017-03-03 MED ORDER — VANCOMYCIN HCL IN DEXTROSE 1-5 GM/200ML-% IV SOLN
1000.0000 mg | Freq: Once | INTRAVENOUS | Status: AC
  Administered 2017-03-03: 1000 mg via INTRAVENOUS

## 2017-03-03 MED ORDER — SODIUM CHLORIDE 0.9 % IV SOLN
Freq: Once | INTRAVENOUS | Status: AC
  Administered 2017-03-03: 11:00:00 via INTRAVENOUS

## 2017-03-03 MED ORDER — MIDAZOLAM HCL 2 MG/2ML IJ SOLN
1.0000 mg | INTRAMUSCULAR | Status: DC | PRN
  Administered 2017-03-03 (×3): 0.5 mg via INTRAVENOUS

## 2017-03-03 MED ORDER — FENTANYL CITRATE (PF) 100 MCG/2ML IJ SOLN
25.0000 ug | INTRAMUSCULAR | Status: DC | PRN
  Administered 2017-03-03: 50 ug via INTRAVENOUS
  Administered 2017-03-03 (×2): 25 ug via INTRAVENOUS
  Administered 2017-03-03: 50 ug via INTRAVENOUS

## 2017-03-03 NOTE — Discharge Instructions (Signed)
Vertebroplasty Post Procedure Discharge Instructions  1. May resume a regular diet and any medications that you routinely take (including pain medications). 2. No driving day of procedure. 3. Upon discharge go home and rest for at least 4 hours.  May use an ice pack as needed to injection sites on back. 4. Remove bandades after shower in the morning. 5. Do not lift anything heavier than a mild jug. 6. Follow up with Dr. Jobe Igo in about 2 weeks.    Please contact our office at [phone] for the following symptoms:   Fever greater than 100 degrees  Increased swelling, pain, or redness at injection site.   Thank you for visiting [facility].

## 2017-03-08 ENCOUNTER — Other Ambulatory Visit: Payer: Self-pay | Admitting: Neurosurgery

## 2017-03-08 DIAGNOSIS — M5126 Other intervertebral disc displacement, lumbar region: Secondary | ICD-10-CM | POA: Diagnosis not present

## 2017-03-08 DIAGNOSIS — M545 Low back pain: Principal | ICD-10-CM

## 2017-03-08 DIAGNOSIS — G8929 Other chronic pain: Secondary | ICD-10-CM

## 2017-03-08 DIAGNOSIS — M5137 Other intervertebral disc degeneration, lumbosacral region: Secondary | ICD-10-CM | POA: Diagnosis not present

## 2017-03-08 DIAGNOSIS — S32040A Wedge compression fracture of fourth lumbar vertebra, initial encounter for closed fracture: Secondary | ICD-10-CM | POA: Diagnosis not present

## 2017-03-08 DIAGNOSIS — M5416 Radiculopathy, lumbar region: Secondary | ICD-10-CM | POA: Diagnosis not present

## 2017-03-09 ENCOUNTER — Ambulatory Visit
Admission: RE | Admit: 2017-03-09 | Discharge: 2017-03-09 | Disposition: A | Discharge status: Home / Self Care | Payer: Medicare Other | Source: Ambulatory Visit | Attending: Neurosurgery | Admitting: Neurosurgery

## 2017-03-09 ENCOUNTER — Other Ambulatory Visit: Payer: Self-pay | Admitting: Neurosurgery

## 2017-03-09 DIAGNOSIS — M545 Low back pain: Secondary | ICD-10-CM | POA: Diagnosis not present

## 2017-03-09 DIAGNOSIS — S32040S Wedge compression fracture of fourth lumbar vertebra, sequela: Secondary | ICD-10-CM | POA: Diagnosis not present

## 2017-03-09 DIAGNOSIS — G8929 Other chronic pain: Secondary | ICD-10-CM

## 2017-03-09 DIAGNOSIS — M5126 Other intervertebral disc displacement, lumbar region: Secondary | ICD-10-CM | POA: Diagnosis not present

## 2017-03-09 NOTE — Progress Notes (Signed)
Chief Complaint: Persistent radicular pain following L4 vertebroplasty 03/03/17    History of Present Illness: Bryan [name] is a 76 y.o. male who underwent L4 vertebrplasty for an L4 inferior endplate fracture, presumably an insufficiency fracture.  He reports some improvement in his low back pain which he now rates as 7/10 compared to 9/10 prior to the procedure.  He is sitting comfortably in a chair enjoying a cup of coffee as we talk.  His Rolland-Morris Disability score is minimally changed from 20 to 19.  Upon further questioning, his low back pain is improved, but he continues to suffer from radicular symptoms which were unrelieved by an epidural injection at [facility] prior to the vertebroplasty.  He denies any new pain.  He denies any drainage or fever.  CT scan demonstrates methylmethacrylate in the L4 vertebral body with extrusion into the disc space and into a right paravertebral vein as previously seen.  There is no new fracture, epidural compromise, or nerve root compression by cement.  Significant right L4/5 foraminal stenosis is again demonstrated.   Review of Systems  Vital Signs: There were no vitals taken for this visit.  Physical Exam  Constitutional: Vital signs are normal. He appears well-developed and well-nourished.  Wound sites are healing well.  He does not have significant pain to palpation over the L4 vertebral body.  Imaging: Ct Lumbar Spine Wo Contrast  Result Date: 03/09/2017 CLINICAL DATA:  Persistent right lower extremity radicular pain following L4 vertebroplasty. The patient does report some relief of his back pain. EXAM: CT LUMBAR SPINE WITHOUT CONTRAST TECHNIQUE: Multidetector CT imaging of the lumbar spine was performed without intravenous contrast administration. Multiplanar CT image reconstructions were also generated. COMPARISON:  CT of the [facility] lumbar spine 02/28/2017. MRI of the lumbar spine 02/16/2017. FINDINGS: Segmentation: 5 non  rib-bearing lumbar type vertebral bodies are present. Alignment: AP alignment is within normal limits and stable. Rightward curvature of the lumbar spine is stable, convex at L2-3. Vertebrae: Right enough pedicular vertebroplasty is noted L4-5. There is filling of any anterior fracture cleft along the inferior endplate. There is also a trabecular filling pattern extending to the superior endplate on the right. Extruded methylmethacrylate is noted on the right. This extends into the disc space, most notably on the right. There is no L5 fracture. A thin stream of methylmethacrylate is noted within a right paraspinal vein extending to the IVC. Paraspinal and other soft tissues: Atherosclerotic changes are again noted in the aorta without aneurysm. No other focal lesions are present. Disc levels: A leftward disc protrusion contributes to stable left foraminal narrowing at L3-4. The broad-based disc protrusion with bilateral foraminal stenosis is again noted L4-5, right greater than left. There is no significant change. There is no cement extruded into the epidural space or foramina. Fusion at L5-S1 is stable. IMPRESSION: 1. Vertebral augmentation at L4 with filling of the fracture cleft and trabecular filling to the superior endplate. 2. Extruded cement into the L4-5 disc space is most prominent on the right. 3. No new fracture. 4. Methylmethacrylate does not compromise the epidural space or foramina. 5. 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 This likely contributes to his radicular symptoms. 6. Stable fusion at L5-S1. 7. 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 8. Atherosclerosis. Electronically Signed   By: San Morelle M.D.   On: 03/09/2017 10:51   Ct Lumbar Spine Wo Contrast  Result Date: 02/28/2017 CLINICAL DATA:  Low back pain and  bilateral leg pain. Numbness and tingling in right foot. History of prior lumbar surgery. The most recent was May 2018. EXAM:  CT LUMBAR SPINE WITHOUT CONTRAST TECHNIQUE: Multidetector CT imaging of the lumbar spine was performed without intravenous contrast administration. Multiplanar CT image reconstructions were also generated. COMPARISON:  Lumbar spine MRI 02/16/2017 FINDINGS: Segmentation: 5 lumbar type vertebral bodies. The last full intervertebral disc space is labeled L5-S1. This correlates with the prior MRI examinations. Alignment: Normal Vertebrae: Remote compression fracture of L1. No acute fractures are identified. No bone lesions. Posterior and interbody fusion changes at L5-S1. No complicating features are identified. Areas of interbody fusion are demonstrated. Paraspinal and other soft tissues: No significant findings. Moderate to advanced aortoiliac vascular calcifications but no focal aneurysm. Simple appearing right renal cysts is noted. Disc levels: T12-L1:  No significant findings. L1-2: Mild posterior osteophytic ridging but no disc protrusion, spinal or foraminal stenosis. L2-3: Mild osteophytic ridging and mild facet disease but no significant spinal or foraminal stenosis. L3-4: Diffuse annular bulge and mild facet disease with mild bilateral lateral recess encroachment. No significant spinal or foraminal stenosis. L4-5: Bulging degenerated annulus, facet disease and ligamentum flavum thickening contributing to mild spinal stenosis and moderate bilateral lateral recess stenosis. There is also mild right foraminal stenosis and moderate left foraminal stenosis. L5-S1: Wide decompressive laminectomy. No obvious spinal or foraminal stenosis. IMPRESSION: 1. L5-S1 fusion hardware in good position without complicating features. Wide decompressive laminectomy without findings for spinal or foraminal stenosis. 2. Remote compression fracture of L4. 3. Multifactorial mild spinal stenosis and moderate bilateral lateral recess stenosis at L4-5. There is also mild right foraminal stenosis and moderate left foraminal stenosis at  this level. Electronically Signed   By: Marijo Sanes M.D.   On: 02/28/2017 15:32   Mr Lumbar Spine W Wo Contrast  Result Date: 02/16/2017 CLINICAL DATA:  Radiculopathy, lumbar region. Low back pain extending into the right lower extremity. Pain began after moving furniture 2 weeks ago. EXAM: MRI LUMBAR SPINE WITHOUT AND WITH CONTRAST TECHNIQUE: Multiplanar and multiecho pulse sequences of the lumbar spine were obtained without and with intravenous contrast. CONTRAST:  69mL MULTIHANCE GADOBENATE DIMEGLUMINE 529 MG/ML IV SOLN COMPARISON:  Lumbar spine radiographs 02/08/2017 and 12/07/2016. FINDINGS: Segmentation: 5 non rib-bearing lumbar type vertebral bodies are present. Alignment: AP alignment is anatomic. Rightward curvature of lumbar spine is centered at L3. Vertebrae: An inferior endplate compression fracture is present at L4. There is 30% loss of height. Heterogeneous edema extends throughout the vertebral body. There is some edema extending into the right pedicle. Heterogeneous higher placement is present throughout the remainder of the lumbar spine, consistent with age. No other focal enhancement is present. Conus medullaris: Extends to the L1-2 level and appears normal. Paraspinal and other soft tissues: Limited imaging of the abdomen demonstrates exophytic cysts of the right kidney. No other focal lesions are present. There is no significant adenopathy. Disc levels: T12-L1:  Negative. L1-2:  Negative. L2-3: A broad-based disc bulge is present. Mild facet hypertrophy is noted. There is no significant stenosis. L3-4: A broad-based disc protrusion is asymmetric to the left. Mild facet hypertrophy is noted bilaterally. This results in mild left subarticular and foraminal stenosis. L4-5: A broad-based disc protrusion is present. Moderate facet hypertrophy is noted bilaterally. Mild subarticular narrowing is present bilaterally facet spurring contributes to moderate bilateral foraminal stenosis, worse on the  right. L5-S1: Lumbar fusion wide laminectomy are noted. The central canal foramina are decompressed. IMPRESSION: 1. Inferior endplate compression fracture at  L4. 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 No definite neoplasm is seen. 2. Broad-based disc protrusion and facet hypertrophy at L4-5 compatible with adjacent level disease. Mild subarticular and moderate foraminal stenosis bilaterally is worse on the right. 3. Mild left subarticular and foraminal narrowing at L3-4. Electronically Signed   By: San Morelle M.D.   On: 02/16/2017 11:01   Dg Epidural Veno/verte Bropl  Result Date: 03/03/2017 CLINICAL DATA:  Nonhealing inferior endplate L4 compression fracture without retropulsion of bone. Patient has severe pain impacting activities of days of living. He is using a walker rather than his typical cane with limited mobility. FLUOROSCOPY TIME:  Radiation Exposure Index (as provided by the fluoroscopic device): 378.13 uGy*m2 Fluoroscopy Time:  3 minutes 42 seconds Number of Acquired Images:  18 PROCEDURE: L4 uni-pedicular VERTEBROPLASTY: Medications utilized: Versed 1.5 mg IV, Fentanyl 150 mcg IV. Toradol 30 mg IV. Vancomycin 1 g was given prior to the procedure for antibiotic prophylaxis. Following a full explanation of the procedure along with the potentially associated complications, an informed witnessed consent was obtained. The patient was positioned prone on the fluoroscopic table. The skin was prepped and draped in the usual sterile fashion. The L4 vertebral body was identified and the right superficial soft tissues were anesthetized with 1% lidocaine to the level of the pedicle. A small skin incision was made. A 13 gauge bone needle was then advanced through the pedicle into the anterior one-third of the vertebral body. At this time, methylmethacrylate mixture was reconstituted. Using intermittent fluoroscopy, the methylmethacrylate mixture was then injected into the L4 vertebral body. Cement  fill the anterior portion of the vertebral body extending into the inferior endplate fracture plane early. A small amount of cement extended into an anterior vein. This was watched closely. There are some extrusion into the inferior disc space, asymmetric to the right. This was allowed to harden. A trabecular filling pattern was also achieved, predominantly on the right. This extend into the superior endplate. I then repositioned the needle slightly more medial and superior. A more extensive trabecular filling pattern was achieved on the right. There was slight increase in extrusion into the right side of the L4-5 disc space. The needle was then retrieved and removed. Hemostasis was achieved at the skin entry site. Postprocedural images demonstrated an excellent trabecular filling pattern from the superior to inferior endplate on the right. There is diffuse filling of the fracture plane inferiorly. Cement is extruded into the superior aspect of the L4-5 disc space, more inferiorly on the right. There is some venous extrusion anteriorly and on the right without significant distal extrusion. There were no acute complications. Patient tolerated the procedure well. IMPRESSION: Technically successful L4 vertebroplasty. There is some cement extruded into the disc space on the right without reaching the L5 superior endplate. There is some venous contamination as well without significant distal embolization. The patient felt significant improvement after the procedure and prior to discharge. He will return in 2-3 weeks for follow-up. Electronically Signed   By: San Morelle M.D.   On: 03/03/2017 12:09   Assessment and Plan:  Status post L4 vertebroplasty with improving low back pain, but persistent radicular symptoms.  The vertebroplasty demonstrates no significant complicating features as described above.  He does have persistent L4-5 foraminal narrowing which likely accounts for his radicular symptoms.  I do  not expect his radicular symptoms to improve from this procedure.  He may benefit from an additional epidural steroid injection for the radiculitis.  This could  be arranged for his follow-up appointment with me on 10/30 or in follow up with Dr. Maryjean Ka.  I will leave this up to Dr. Saintclair Halsted.  I will see Mr. Dubberly back 10/30 regarding a final follow-up for his vertebroplasty.   Electronically Signed: Arna Snipe, MD 03/09/2017, 4:36 PM   I spent a total of  10 Minutes in face to face in clinical consultation, greater than 50% of which was counseling/coordinating care for Mr. Weant

## 2017-03-12 ENCOUNTER — Encounter: Payer: Self-pay | Admitting: Hematology and Oncology

## 2017-03-15 ENCOUNTER — Telehealth: Payer: Self-pay | Admitting: Radiology

## 2017-03-15 NOTE — Telephone Encounter (Signed)
Pt called and wanted to know if we had made arrangements for him to get an injection for his continued pain. Explained that Dr. Jobe Igo sent a note but Dr. Saintclair Halsted had to send [place] the order. Pt will call Dr. Windy Carina office and I will also notify office of pt's wish for epidural steroid injection.

## 2017-03-16 ENCOUNTER — Ambulatory Visit: Payer: Medicare Other | Admitting: Family Medicine

## 2017-03-16 ENCOUNTER — Other Ambulatory Visit: Payer: Self-pay | Admitting: Neurosurgery

## 2017-03-16 DIAGNOSIS — M5416 Radiculopathy, lumbar region: Secondary | ICD-10-CM

## 2017-03-17 ENCOUNTER — Inpatient Hospital Stay: Payer: Medicare Other | Admitting: Hematology and Oncology

## 2017-03-18 ENCOUNTER — Ambulatory Visit: Payer: Medicare Other | Admitting: Hematology and Oncology

## 2017-03-18 ENCOUNTER — Other Ambulatory Visit: Payer: Medicare Other

## 2017-03-21 ENCOUNTER — Ambulatory Visit (INDEPENDENT_AMBULATORY_CARE_PROVIDER_SITE_OTHER): Payer: Medicare Other | Admitting: Family Medicine

## 2017-03-21 ENCOUNTER — Encounter: Payer: Self-pay | Admitting: Family Medicine

## 2017-03-21 DIAGNOSIS — H109 Unspecified conjunctivitis: Secondary | ICD-10-CM

## 2017-03-21 DIAGNOSIS — I2510 Atherosclerotic heart disease of native coronary artery without angina pectoris: Secondary | ICD-10-CM | POA: Diagnosis not present

## 2017-03-21 MED ORDER — POLYMYXIN B-TRIMETHOPRIM 10000-0.1 UNIT/ML-% OP SOLN: 1.0000 [drp] | Freq: Four times a day (QID) | OPHTHALMIC | 0 refills | Status: DC

## 2017-03-21 NOTE — Progress Notes (Signed)
B conjunctivitis.  Sick contact with presumed conjunctivitis.  He got sx soon thereafter.  Using saline.  B eye irritation, with some discharge.  Vision is okay when he can get the discharge out.  No FCNAVD.  He doesn't feel unwell but his eye are irritated.   Clear eye discharge noted.    He has f/u epidural injection tomorrow.    On baseline meds, listed.  D/w pt.    Meds, vitals, and allergies reviewed.   ROS: Per HPI unless specifically indicated in ROS section   nad ncat Tm wnl Nasal and OP exam wnl Neck supple, no LA EOMI PERRL but bilateral limbus sparing conjunctival injection without discharge or foreign body seen. He has bilateral upper and lower eyelid irritation.

## 2017-03-21 NOTE — Patient Instructions (Signed)
Start polytrim.  Use warm compresses and update [place] as needed.  Take care.  Glad to see you.

## 2017-03-22 ENCOUNTER — Other Ambulatory Visit: Payer: BLUE CROSS/BLUE SHIELD

## 2017-03-22 ENCOUNTER — Inpatient Hospital Stay: Admission: RE | Admit: 2017-03-22 | Payer: BLUE CROSS/BLUE SHIELD | Source: Ambulatory Visit

## 2017-03-22 DIAGNOSIS — H109 Unspecified conjunctivitis: Secondary | ICD-10-CM | POA: Insufficient documentation

## 2017-03-22 NOTE — Assessment & Plan Note (Signed)
Presumed. Routine cautions given. Start Polytrim. See orders. See after visit summary. Update [place] as needed. He agrees.

## 2017-03-29 ENCOUNTER — Ambulatory Visit
Admission: RE | Admit: 2017-03-29 | Discharge: 2017-03-29 | Disposition: A | Discharge status: Home / Self Care | Payer: Medicare Other | Source: Ambulatory Visit | Attending: Neurosurgery | Admitting: Neurosurgery

## 2017-03-29 DIAGNOSIS — M545 Low back pain: Secondary | ICD-10-CM | POA: Diagnosis not present

## 2017-03-29 DIAGNOSIS — M5416 Radiculopathy, lumbar region: Secondary | ICD-10-CM

## 2017-03-29 DIAGNOSIS — Z09 Encounter for follow-up examination after completed treatment for conditions other than malignant neoplasm: Secondary | ICD-10-CM

## 2017-03-29 MED ORDER — METHYLPREDNISOLONE ACETATE 40 MG/ML INJ SUSP (RADIOLOG
120.0000 mg | Freq: Once | INTRAMUSCULAR | Status: AC
  Administered 2017-03-29: 120 mg via EPIDURAL

## 2017-03-29 MED ORDER — IOPAMIDOL (ISOVUE-M 200) INJECTION 41%
1.0000 mL | Freq: Once | INTRAMUSCULAR | Status: AC
  Administered 2017-03-29: 1 mL via EPIDURAL

## 2017-03-29 NOTE — Discharge Instructions (Signed)

## 2017-04-19 ENCOUNTER — Telehealth: Payer: Self-pay

## 2017-04-19 NOTE — Telephone Encounter (Signed)
Patient does not need anymore pneumonia vaccines.

## 2017-04-19 NOTE — Telephone Encounter (Signed)
Patient is aware 

## 2017-04-19 NOTE — Telephone Encounter (Signed)
Patient had pneumonia shot in 2015 and 2016. Is he due for another?  Copied from [place]. Topic: Quick Communication - Other Results >> Apr 19, 2017  8:35 AM Synthia Innocent wrote:   Patient wanting to know if due for pneumonia injection

## 2017-04-19 NOTE — Telephone Encounter (Deleted)
Copied from [place]. Topic: Quick Communication - Other Results >> Apr 19, 2017  8:35 AM Synthia Innocent wrote: Patient wanting to know if due for pneumonia injection

## 2017-04-20 NOTE — Progress Notes (Signed)
11:11 AM   BRAYDAN [name] [birth year] [id number]  Referring provider: [name], MD [address] [place], [place] [address]  Chief Complaint  Patient presents with  . Hypogonadism    HPI: Patient is a 76 year old  Caucasian  male with testosterone deficiency, erectile dysfunction and BPH with LUTS who presents today for to discuss Myrbetriq.    BPH WITH LUTS His IPSS score today is 11, which is moderate lower urinary tract symptomatology. He is mixed with his quality life due to his urinary symptoms.  His PVR is 56 mL.  His previous IPSS score was 18/4.  His previous PVR is 55 mL.  His main complaints today are urinary urgency, frequency, nocturia and incontinence.  He had good results with the Myrbertriq 50 mg daily, but he has been without the medication for a few weeks.  He denies any dysuria, hematuria or suprapubic pain.  His has had laser bladder outlet procedure in 05/05/2013.  He also denies any recent fevers, chills, nausea or vomiting.   He does not have a family history of PCa.  IPSS    Row Name 04/21/17 1000         International Prostate Symptom Score   How often have you had the sensation of not emptying your bladder?  Less than 1 in 5     How often have you had to urinate less than every two hours?  About half the time     How often have you found you stopped and started again several times when you urinated?  Less than 1 in 5 times     How often have you found it difficult to postpone urination?  About half the time     How often have you had a weak urinary stream?  Not at All     How often have you had to strain to start urination?  Not at All     How many times did you typically get up at night to urinate?  3 Times     Total IPSS Score  11       Quality of Life due to urinary symptoms   If you were to spend the rest of your life with your urinary condition just the way it is now how would you feel about that?  Mixed        Score:    1-7 Mild 8-19 Moderate 20-35 Severe     PMH: Past Medical History:  Diagnosis Date  . Abnormal CT scan    Asymmetric left rectal wall thickening   . Allergy   . Anemia   . Arrhythmia   . Chronic lower back pain   . Complication of anesthesia    Memory loss 09/2015  . Coronary artery disease   . GERD (gastroesophageal reflux disease)   . Glaucoma   . H/O thoracic aortic aneurysm repair   . History of being hospitalized    memory lose kidney funtion down blood pressure up  . History of blood transfusion    "think he had one when he had heart valve OR" (10/14/2016)  . History of chicken pox   . Hyperlipidemia   . Hypertension   . Lumbar stenosis   . Osteoarthritis    worse in feet and ankles  . Paroxysmal A-fib ([place])   . Rheumatoid arthritis ([place])    "all over" (10/14/2016)  . Valvular heart disease     Surgical History: Past Surgical  History:  Procedure Laterality Date  . AORTIC VALVE REPLACEMENT  2007   [facility].  [place], [place]  . APPENDECTOMY  2010  . BACK SURGERY    . CARDIAC VALVE REPLACEMENT    . CATARACT EXTRACTION W/ INTRAOCULAR LENS  IMPLANT, BILATERAL Bilateral   . COLONOSCOPY WITH PROPOFOL N/A 09/24/2016   Procedure: COLONOSCOPY WITH PROPOFOL;  Surgeon: Jonathon Bellows, MD;  Location: [facility] ENDOSCOPY;  Service: Endoscopy;  Laterality: N/A;  . ESOPHAGOGASTRODUODENOSCOPY (EGD) WITH PROPOFOL N/A 09/24/2016   Procedure: ESOPHAGOGASTRODUODENOSCOPY (EGD) WITH PROPOFOL;  Surgeon: Jonathon Bellows, MD;  Location: [facility] ENDOSCOPY;  Service: Endoscopy;  Laterality: N/A;  . GIVENS CAPSULE STUDY N/A 09/24/2016   Procedure: GIVENS CAPSULE STUDY;  Surgeon: Jonathon Bellows, MD;  Location: [facility] ENDOSCOPY;  Service: Endoscopy;  Laterality: N/A;  . HAMMER TOE SURGERY Right 10/09/2015   Procedure: HAMMER TOE REPAIR WITH K-WIRE FIXATION RIGHT SECOND TOE;  Surgeon: Albertine Patricia, DPM;  Location: [place];  Service: Podiatry;  Laterality: Right;  WITH LOCAL  . JOINT REPLACEMENT  Left 2008   knee  . LAPAROSCOPIC CHOLECYSTECTOMY  2010  . LUMBAR LAMINECTOMY/DECOMPRESSION MICRODISCECTOMY Right 03/08/2016   Procedure: Laminectomy and Foraminotomy - Lumbar Five-Sacral One Right;  Surgeon: Kary Kos, MD;  Location: [place];  Service: Neurosurgery;  Laterality: Right;  Right  . MULTIPLE TOOTH EXTRACTIONS     "3"  . POSTERIOR LUMBAR FUSION  10/14/2016   L5-S1  . REPLACEMENT TOTAL KNEE Left 2003  . THORACIC AORTIC ANEURYSM REPAIR  2010    Home Medications:  Allergies as of 04/21/2017      Reactions   Penicillins Hives, Swelling   SWELLING REACTION UNSPECIFIED PATIENT HAS TAKEN AMOXICILLIN ON MED HX FROM [facility] Has patient had a PCN reaction causing immediate rash, facial/tongue/throat swelling, SOB or lightheadedness with hypotension: Yes Has patient had a PCN reaction causing severe rash involving mucus membranes or skin necrosis: No Has patient had a PCN reaction that required hospitalization No Has patient had a PCN reaction occurring within the last 10 years: No If all of the above answers are "NO", then may proceed with   Demerol [meperidine] Hives, Nausea And Vomiting      Medication List        Accurate as of 04/21/17 11:11 AM. Always use your most recent med list.          amLODipine 5 MG tablet Commonly known as:  NORVASC TAKE ONE TABLET BY MOUTH DAILY   amoxicillin 500 MG capsule Commonly known as:  AMOXIL Take 2,000 mg by mouth once. Take 4 Capsules prior to dental appointment   aspirin 325 MG tablet Take 325 mg by mouth daily.   atorvastatin 40 MG tablet Commonly known as:  LIPITOR Take 1 tablet (40 mg total) by mouth daily.   brimonidine 0.2 % ophthalmic solution Commonly known as:  ALPHAGAN Place 1 drop into both eyes every morning.   cetirizine 10 MG tablet Commonly known as:  ZYRTEC Take 10 mg by mouth daily.   donepezil 10 MG tablet Commonly known as:  ARICEPT Take 10 mg by mouth at bedtime.   fenofibrate 160 MG tablet TAKE  ONE TABLET BY MOUTH EVERY DAY   FLUoxetine 20 MG capsule Commonly known as:  PROZAC TAKE 1 CAPSULE BY MOUTH DAILY   folic acid 1 MG tablet Commonly known as:  FOLVITE Take 1 mg by mouth 2 (two) times daily.   gabapentin 400 MG capsule Commonly known as:  NEURONTIN Take 400 mg by  mouth 3 (three) times daily.   HYDROcodone-acetaminophen 10-325 MG tablet Commonly known as:  NORCO Take 1 tablet by mouth every 4 (four) hours as needed.   hydrocortisone cream 1 % Apply 1 application topically daily as needed for itching.   lisinopril 20 MG tablet Commonly known as:  PRINIVIL,ZESTRIL TAKE ONE TABLET BY MOUTH EVERY EVENING   mirabegron ER 50 MG Tb24 tablet Commonly known as:  MYRBETRIQ Take 1 tablet (50 mg total) by mouth daily.   omeprazole 40 MG capsule Commonly known as:  PRILOSEC TAKE 1 CAPSULE BY MOUTH DAILY   predniSONE 5 MG tablet Commonly known as:  DELTASONE Take 5 mg by mouth daily as needed. Reported on 11/17/2015   tamsulosin 0.4 MG Caps capsule Commonly known as:  FLOMAX TAKE 1 CAPSULE EVERY DAY   tenofovir 300 MG tablet Commonly known as:  VIREAD Take 1 tablet (300 mg total) by mouth daily.   traZODone 50 MG tablet Commonly known as:  DESYREL Take 100 mg by mouth at bedtime as needed for sleep. Reported on 10/09/2015       Allergies:  Allergies  Allergen Reactions  . Penicillins Hives and Swelling    SWELLING REACTION UNSPECIFIED PATIENT HAS TAKEN AMOXICILLIN ON MED HX FROM [facility] Has patient had a PCN reaction causing immediate rash, facial/tongue/throat swelling, SOB or lightheadedness with hypotension: Yes Has patient had a PCN reaction causing severe rash involving mucus membranes or skin necrosis: No Has patient had a PCN reaction that required hospitalization No Has patient had a PCN reaction occurring within the last 10 years: No If all of the above answers are "NO", then may proceed with  . Demerol [Meperidine] Hives and Nausea And Vomiting     Family History: Family History  Problem Relation Age of Onset  . Heart failure Mother   . Hypertension Mother   . Asthma Mother   . Heart attack Father 36       MI  . Hypertension Sister   . Prostate cancer Neg Hx   . Bladder Cancer Neg Hx   . Kidney cancer Neg Hx     Social History:  reports that he quit smoking about 35 years ago. His smoking use included cigarettes. He has a 32.00 pack-year smoking history. he has never used smokeless tobacco. He reports that he drinks alcohol. He reports that he does not use drugs.  ROS: UROLOGY Frequent Urination?: Yes Hard to postpone urination?: Yes Burning/pain with urination?: No Get up at night to urinate?: Yes Leakage of urine?: Yes Urine stream starts and stops?: Yes Trouble starting stream?: No Do you have to strain to urinate?: No Blood in urine?: No Urinary tract infection?: No Sexually transmitted disease?: No Injury to kidneys or bladder?: No Painful intercourse?: No Weak stream?: No Erection problems?: No Penile pain?: No  Gastrointestinal Nausea?: No Vomiting?: No Indigestion/heartburn?: No Diarrhea?: No Constipation?: No  Constitutional Fever: No Night sweats?: Yes Weight loss?: No Fatigue?: No  Skin Skin rash/lesions?: No Itching?: No  Eyes Blurred vision?: No Double vision?: No  Ears/Nose/Throat Sore throat?: No Sinus problems?: No  Hematologic/Lymphatic Swollen glands?: No Easy bruising?: No  Cardiovascular Leg swelling?: No Chest pain?: No  Respiratory Cough?: No Shortness of breath?: No  Endocrine Excessive thirst?: No  Musculoskeletal Back pain?: Yes Joint pain?: No  Neurological Headaches?: No Dizziness?: No  Psychologic Depression?: No Anxiety?: No  Physical Exam: BP 130/68   Pulse 61   Ht 6' (1.829 m)   Wt 174 lb 1.6  oz (79 kg)   BMI 23.61 kg/m   Constitutional: Well nourished. Alert and oriented, No acute distress. HEENT: [place] AT, moist mucus membranes.  Trachea midline, no masses. Cardiovascular: No clubbing, cyanosis, or edema. Respiratory: Normal respiratory effort, no increased work of breathing. Skin: No rashes, bruises or suspicious lesions. Lymph: No cervical or inguinal adenopathy. Neurologic: Grossly intact, no focal deficits, moving all 4 extremities. Psychiatric: Normal mood and affect.   Laboratory Data: Lab Results  Component Value Date   WBC 4.2 03/03/2017   HGB 12.2 (L) 03/03/2017   HCT 36.0 (L) 03/03/2017   MCV 90.4 03/03/2017   PLT 164 03/03/2017    Lab Results  Component Value Date   CREATININE 1.27 (H) 01/26/2017    PSA history:  <0.1 ng/mL on 01/17/2014    0.2 ng/mL on 08/07/2014    0.3 ng/mL on 02/17/2015    0.2 ng/mL on 07/09/2015  <0.2 ng/mL on 01/03/2017  Lab Results  Component Value Date   TESTOSTERONE 11.40 (L) 12/24/2016   Assessment & Plan:    1. BPH with LUTS  - IPSS score is 11/3, it is improving  - Continue conservative management, avoiding bladder irritants and timed voiding's  - most bothersome symptoms is/are frequency, urgency, nocturia and intermittency  - Continue tamsulosin 0.4 mg daily  - RTC in 1 year for I PSS and PVR   2. Frequency  - continue Myrbetriq 50 mg daily; script sent to pharmacy  - RTC in one year for I PSS and PVR  Return in about 1 year (around 04/21/2018) for IPSS, exam and PVR.  Zara Council, [facility] [address], [place], [place] [address] [phone]

## 2017-04-21 ENCOUNTER — Ambulatory Visit (INDEPENDENT_AMBULATORY_CARE_PROVIDER_SITE_OTHER): Payer: Medicare Other | Admitting: Urology

## 2017-04-21 ENCOUNTER — Encounter: Payer: Self-pay | Admitting: Urology

## 2017-04-21 VITALS — BP 130/68 | HR 61 | Ht 72.0 in | Wt 174.1 lb

## 2017-04-21 DIAGNOSIS — N138 Other obstructive and reflux uropathy: Secondary | ICD-10-CM | POA: Diagnosis not present

## 2017-04-21 DIAGNOSIS — R350 Frequency of micturition: Secondary | ICD-10-CM

## 2017-04-21 DIAGNOSIS — N401 Enlarged prostate with lower urinary tract symptoms: Secondary | ICD-10-CM

## 2017-04-21 DIAGNOSIS — I2510 Atherosclerotic heart disease of native coronary artery without angina pectoris: Secondary | ICD-10-CM | POA: Diagnosis not present

## 2017-04-21 LAB — BLADDER SCAN AMB NON-IMAGING: Scan Result: 56

## 2017-04-21 MED ORDER — MIRABEGRON ER 50 MG PO TB24: 50.0000 mg | ORAL_TABLET | Freq: Every day | ORAL | 3 refills | Status: DC

## 2017-04-28 ENCOUNTER — Other Ambulatory Visit: Payer: Self-pay | Admitting: Family Medicine

## 2017-04-28 ENCOUNTER — Ambulatory Visit (INDEPENDENT_AMBULATORY_CARE_PROVIDER_SITE_OTHER): Payer: Medicare Other | Admitting: *Deleted

## 2017-04-28 DIAGNOSIS — Z23 Encounter for immunization: Secondary | ICD-10-CM | POA: Diagnosis not present

## 2017-05-13 ENCOUNTER — Other Ambulatory Visit: Payer: Self-pay | Admitting: Cardiovascular Disease

## 2017-05-18 ENCOUNTER — Telehealth: Payer: Self-pay | Admitting: Cardiovascular Disease

## 2017-05-18 NOTE — Telephone Encounter (Signed)
Lmov for patient to call back and schedule appointment ° ° °

## 2017-05-18 NOTE — Telephone Encounter (Signed)
-----   Message from Anselm Pancoast, [place] sent at 05/16/2017  7:52 AM EST ----- Patient needs a follow up ASAP!  He has a recall in for Dec. 2018. Needs refills.  Thanks, Ivin Booty

## 2017-05-19 ENCOUNTER — Other Ambulatory Visit: Payer: Self-pay | Admitting: Neurosurgery

## 2017-05-19 DIAGNOSIS — S32040A Wedge compression fracture of fourth lumbar vertebra, initial encounter for closed fracture: Secondary | ICD-10-CM | POA: Diagnosis not present

## 2017-05-19 DIAGNOSIS — M48062 Spinal stenosis, lumbar region with neurogenic claudication: Secondary | ICD-10-CM | POA: Diagnosis not present

## 2017-06-03 NOTE — Pre-Procedure Instructions (Signed)
Bryan [name]  06/03/2017      [facility] - [place], [place] - [address] [place] [address] Phone: [phone] Fax: [phone]    Your procedure is scheduled on Wednesday January 16.  Report to [facility] Admitting at 9:30 A.M.  Call this number if you have problems the morning of surgery:  [phone]   Remember:  Do not eat food or drink liquids after midnight.  Take these medicines the morning of surgery with A SIP OF WATER:   Amlodipine (norvasc) Cetirizine (zyrtec) Fluoxetine (prozac) Gabapentin (neurontin)  Omeprazole (prilosec) Tamsulosin (flomax) Mirabegron (Myrbetriq) Afrin Nasal spray Hydrocodone-acetaminophen (norco) if needed  7 days prior to surgery STOP taking any Aleve, Naproxen, Ibuprofen, Motrin, Advil, Goody's, BC's, all herbal medications, fish oil, and all vitamins  **Follow Surgeon's instructions on stopping Aspirin. If no instructions were given, please call your surgeon's office**    Do not wear jewelry, make-up or nail polish.  Do not wear lotions, powders, or perfumes, or deodorant.  Do not shave 48 hours prior to surgery.  Men may shave face and neck.  Do not bring valuables to the hospital.  [facility] is not responsible for any belongings or valuables.  Contacts, dentures or bridgework may not be worn into surgery.  Leave your suitcase in the car.  After surgery it may be brought to your room.  For patients admitted to the hospital, discharge time will be determined by your treatment team.  Patients discharged the day of surgery will not be allowed to drive home.  Special instructions:    [place]- Preparing For Surgery  Before surgery, you can play an important role. Because skin is not sterile, your skin needs to be as free of germs as possible. You can reduce the number of germs on your skin by washing with CHG (chlorahexidine gluconate) Soap before surgery.  CHG is an  antiseptic cleaner which kills germs and bonds with the skin to continue killing germs even after washing.  Please do not use if you have an allergy to CHG or antibacterial soaps. If your skin becomes reddened/irritated stop using the CHG.  Do not shave (including legs and underarms) for at least 48 hours prior to first CHG shower. It is OK to shave your face.  Please follow these instructions carefully.   1. Shower the NIGHT BEFORE SURGERY and the MORNING OF SURGERY with CHG.   2. If you chose to wash your hair, wash your hair first as usual with your normal shampoo.  3. After you shampoo, rinse your hair and body thoroughly to remove the shampoo.  4. Use CHG as you would any other liquid soap. You can apply CHG directly to the skin and wash gently with a scrungie or a clean washcloth.   5. Apply the CHG Soap to your body ONLY FROM THE NECK DOWN.  Do not use on open wounds or open sores. Avoid contact with your eyes, ears, mouth and genitals (private parts). Wash Face and genitals (private parts)  with your normal soap.  6. Wash thoroughly, paying special attention to the area where your surgery will be performed.  7. Thoroughly rinse your body with warm water from the neck down.  8. DO NOT shower/wash with your normal soap after using and rinsing off the CHG Soap.  9. Pat yourself dry with a CLEAN TOWEL.  10. Wear CLEAN PAJAMAS to bed the night before surgery, wear  comfortable clothes the morning of surgery  11. Place CLEAN SHEETS on your bed the night of your first shower and DO NOT SLEEP WITH PETS.    Day of Surgery: Do not apply any deodorants/lotions. Please wear clean clothes to the hospital/surgery center.      Please read over the following fact sheets that you were given. Coughing and Deep Breathing, MRSA Information and Surgical Site Infection Prevention

## 2017-06-06 ENCOUNTER — Other Ambulatory Visit: Payer: Self-pay

## 2017-06-06 ENCOUNTER — Encounter (HOSPITAL_COMMUNITY)
Admission: RE | Admit: 2017-06-06 | Discharge: 2017-06-06 | Disposition: A | Discharge status: Home / Self Care | Payer: Medicare Other | Source: Ambulatory Visit | Attending: Neurosurgery | Admitting: Neurosurgery

## 2017-06-06 ENCOUNTER — Other Ambulatory Visit: Payer: Self-pay | Admitting: Neurosurgery

## 2017-06-06 ENCOUNTER — Encounter (HOSPITAL_COMMUNITY): Payer: Self-pay

## 2017-06-06 DIAGNOSIS — M5416 Radiculopathy, lumbar region: Secondary | ICD-10-CM | POA: Diagnosis not present

## 2017-06-06 DIAGNOSIS — Z01812 Encounter for preprocedural laboratory examination: Secondary | ICD-10-CM | POA: Insufficient documentation

## 2017-06-06 DIAGNOSIS — M48062 Spinal stenosis, lumbar region with neurogenic claudication: Secondary | ICD-10-CM | POA: Diagnosis not present

## 2017-06-06 HISTORY — DX: Other amnesia: R41.3

## 2017-06-06 HISTORY — DX: Polyneuropathy, unspecified: G62.9

## 2017-06-06 LAB — BASIC METABOLIC PANEL
Anion gap: 10 (ref 5–15)
BUN: 19 mg/dL (ref 6–20)
CO2: 24 mmol/L (ref 22–32)
Calcium: 9 mg/dL (ref 8.9–10.3)
Chloride: 102 mmol/L (ref 101–111)
Creatinine, Ser: 1.03 mg/dL (ref 0.61–1.24)
GFR calc Af Amer: >60 mL/min (ref >60)
GFR calc non Af Amer: >60 mL/min (ref >60)
Glucose, Bld: 122 mg/dL — ABNORMAL HIGH (ref 65–99)
Potassium: 3.8 mmol/L (ref 3.5–5.1)
Sodium: 136 mmol/L (ref 135–145)

## 2017-06-06 LAB — SURGICAL PCR SCREEN
MRSA, PCR: NEGATIVE
Staphylococcus aureus: POSITIVE — AB

## 2017-06-06 LAB — CBC
HCT: 32.1 % — ABNORMAL LOW (ref 39.0–52.0)
Hemoglobin: 10.3 g/dL — ABNORMAL LOW (ref 13.0–17.0)
MCH: 30.2 pg (ref 26.0–34.0)
MCHC: 32.1 g/dL (ref 30.0–36.0)
MCV: 94.1 fL (ref 78.0–100.0)
Platelets: 123 10*3/uL — ABNORMAL LOW (ref 150–400)
RBC: 3.41 MIL/uL — ABNORMAL LOW (ref 4.22–5.81)
RDW: 14.2 % (ref 11.5–15.5)
WBC: 3.1 10*3/uL — ABNORMAL LOW (ref 4.0–10.5)

## 2017-06-06 NOTE — Progress Notes (Signed)
PCP - [name] Cardiologist - Dr. Hedy Camara Nephrologist- Dr. Candiss Norse, has not seen since before surgery in May.   EKG - 11/01/16 ECHO - 11/27/15  Aspirin Instructions: pt was instructed by Dr. Windy Carina office to stop ASA 7 days prior to surgery. Pt states he last took ASA on 06/05/17  Anesthesia review: cardiac history.  Pt states he believed he was having a lumbar fusion rather than laminectomy. Relayed this information to [place] at Dr. Windy Carina office. Pt states he will call the office as well to verify prior to surgery.   Patient denies shortness of breath, fever, cough and chest pain at PAT appointment   Patient verbalized understanding of instructions that were given to them at the PAT appointment. Patient was also instructed that they will need to review over the PAT instructions again at home before surgery.

## 2017-06-07 MED ORDER — DEXAMETHASONE SODIUM PHOSPHATE 10 MG/ML IJ SOLN
10.0000 mg | INTRAMUSCULAR | Status: DC
  Filled 2017-06-07: qty 1

## 2017-06-07 MED ORDER — VANCOMYCIN HCL IN DEXTROSE 1-5 GM/200ML-% IV SOLN
1000.0000 mg | INTRAVENOUS | Status: AC
  Administered 2017-06-08: 1000 mg via INTRAVENOUS
  Filled 2017-06-07: qty 200

## 2017-06-07 NOTE — Progress Notes (Signed)
Anesthesia chart review: Patient is a 77 year old male posted for L3-4, L4-5 PLA, right L4-5 laminectomy and foraminotomy, possible removal of hardware L5-S1 on 06/08/17 by Dr. Kary [name]. He is s/p L5-S1 PLIF on 10/14/16   History includes former smoker (quit '83), CAD ("mild to moderate nonobstructive 2v" '10), bicuspid aortic valve with ascending aortic aneurysm (s/p AVR, #25 Mosaic porcine valve, and TAA repair 09/09/08 by Dr. Shanon [name], [facility]), moderate aortic stenosis and moderately dilated aortic root(11/2015 echo), PAF (post cardiac surgery), hypertension, hyperlipidemia, glaucoma, depression, chronic back pain, RA, osteoarthritis, appendectomy '10, cholecystectomy '10, hammer toe with K wire fixation 10/09/15, right L5-S1 laminectomy/foraminotomy 03/08/16, L5-S1 PLIF 10/14/16. He has moderate pulmonary hypertension by 2016 and 2017 echocardiograms. Hereported memory loss following his May 2017 surgery (done under MAC anesthesia; midazolam, propofol, lidocaine). He apparently had to be admitted 10/11/15-10/13/15 for altered mental status following surgery. He elevated Cr with nausea and poor PO intake requiring hydration.   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 He lives in [place].  - PCP is Dr. Tommi [name]. - Cardiologist is Dr. Ida [name], last visit 11/01/16. No CAD symptoms, so no further work-up recommended at that time. In regards to TAA and mild AS seen on 11/2015, he planned to repeat echo in 2019. He backed off of BP regimen due to asymptomatic first degree AV block. Next visit is scheduled for 06/20/17. - Neurologist is Dr. Gurney [name], last visit with Bryan Falco, NP on 04/30/16. - Rheumatologist is Dr. Percell [name], last visit 01/10/17. - Hematologist is with Bryan [name]. He was seen last by Bryan [name] on 12/17/16. He had been seen for asymmetric left rectal wall thickening on CT and pancytopenia. He underwent bone marrow biopsy  on 11/04/16 that showed normocellular marrow (20%) with trilineage hematopoiesis and maturation, mild megakaryocytic ayptia, normocytic anemia and leukopenia. Leukocytes morphologically unremarkable. - Nephrologist is Bryan [name] with Bryan [name]. As of 12/2015, patient was overall classified as CKD stage III with baseline Cr was ~ 1.6-1.7, GFR 42 with fuctuations due to variation in volume status from cardiopulmonary and renal hemodynamics.  - Gastroenterologist is Dr. Jonathon [name]. He had EGD/colonoscopy 09/24/16 for evaluation of iron deficiency anemia. Colonoscopy showed non-bleeding internal hemorrhoids, diverticulosis, 3 mm cecal polyp s/p removal (tubular adenoma, negative for high grade dysplasia and malignancy). EGD showed monilial esophagitis (mild chronic gastritis/healing mucosal injury, negative for H. Pylori, dysplasia, malignancy).  - Urologist is with Bryan [name]. Last visti with Bryan [name] 04/21/17.    Meds include amlodipine, aspirin 325 mg (last dose 06/05/17), amoxicillin (PRN dental procedures), Alphagan ophthalmic, Zrytec, Aricept, fenofibrate, Prozac, folic acid, gabapentin, Norco, lisinopril, Myrbetriq, Prilosec, Afrin nasal spray, prednisone (PRN RA flare), Flomax, trazodone.  BP (!) 127/57   Pulse 71   Temp 36.7 C   Resp 20   Ht 6' (1.829 m)   Wt 178 lb 6.4 oz (80.9 kg)   SpO2 95%   BMI 24.20 kg/m   EKG 11/01/16: SB at 52 bpm, first degree AV block.  Echo 11/27/15: Study Conclusions - Left ventricle: The cavity size was mildly dilated. Systolic function was normal. The estimated ejection fraction was in the range of 50% to 55%. Wall motion was normal; there were no regional wall motion abnormalities. Features are consistent with a pseudonormal left ventricular filling pattern, with concomitant abnormal relaxation and increased filling pressure (grade 2 diastolic dysfunction). - Aortic valve: A bioprosthesis was present. There  was moderate stenosis. Mean  gradient (S): 20 mm Hg. Valve area (VTI): 1.24 cm^2. - Aorta: Aortic root dimension: 44 mm (ED). - Aortic root: The aortic root was moderately dilated. - Mitral valve: There was mild regurgitation. - Left atrium: The atrium was moderately dilated. - Right atrium: The atrium was mildly dilated. - Pulmonary arteries: Systolic pressure was moderately increased. PA peak pressure: 53 mm Hg (S). Impressions: - Relatively stable findings since last year except for increased pulmonary pressure from 42 to 53 mm Hg. (Dr. Rockey [name] felt findings had not significantly changed in the past year. Right heart pressure elevated, likely from history of smoking, prosthetic valve, renal dysfunction. Could repeat in one year. As of 10/2016, he planned to repeat echo in 2019.)  By notes, he had mild to moderate non-obstructive CAD by cath done prior to AVR/TAA repair.   Carotid U/S 06/03/15: Impressions: Normal carotid arteries, bilaterally. Patent vertebral arteries with antegrade flow. Normal subclavian arteries, bilaterally.  CT (non-contrast) chest/abd/pelvis 08/09/16: IMPRESSION: 1. No acute findings or clear evidence of malignancy in the chest, abdomen or pelvis. 2. Asymmetric left rectal wall thickening difficult to exclude, although may be secondary to volume averaging with the prostate gland. Correlation with digital rectal examination recommended. 3. Mild emphysema and subpleural reticulation in both lung bases. 4. Atherosclerosis as described. 5. Involuting left renal cyst. 6. Small amount fluid in the right inguinal canal with probable adjacent right inguinal reactive lymph nodes  Preoperative labs noted. BUN 24, Cr 1.03. WBC 3.1. H/H 10.3/32.1 (previously 12.2/36.0 on 03/03/17, 10.0/29.5 on 12/17/16). PLT 123K. Glucose 122.  He does have (recurrent) moderate AS (mean gradient 20)/moderate TAA and moderate pulmonary hypertension by 11/2015 echo. As of 10/2016, plan was to  repeat echo in "2019." Renal function and anemia appear stable. Discussed with anesthesiologist Dr. Roberts [name]. Patient seen by cardiology 6 months ago with follow-up echo planned sometime this year. If patient without chest pain, dizziness, syncope, new or progressive SOB or other symptoms to suggest significant progression of AS then it is anticipated that he can proceed as planned. By notes, I don't see documentation of symptomatic AS, but I have called patient and left a voice message for him to call me to clinically correlate.   Bryan [name] [facility] Short Stay Center/Anesthesiology Phone [phone] 06/07/2017 1:11 PM

## 2017-06-08 ENCOUNTER — Inpatient Hospital Stay (HOSPITAL_COMMUNITY): Payer: Medicare Other | Admitting: Certified Registered Nurse Anesthetist

## 2017-06-08 ENCOUNTER — Other Ambulatory Visit: Payer: Self-pay

## 2017-06-08 ENCOUNTER — Inpatient Hospital Stay (HOSPITAL_COMMUNITY): Payer: Medicare Other

## 2017-06-08 ENCOUNTER — Inpatient Hospital Stay (HOSPITAL_COMMUNITY)
Admission: RE | Admit: 2017-06-08 | Discharge: 2017-06-10 | DRG: 460 | Disposition: A | Discharge status: Home Health | Payer: Medicare Other | Source: Ambulatory Visit | Attending: Neurosurgery | Admitting: Neurosurgery

## 2017-06-08 ENCOUNTER — Encounter (HOSPITAL_COMMUNITY): Payer: Self-pay | Admitting: *Deleted

## 2017-06-08 ENCOUNTER — Inpatient Hospital Stay (HOSPITAL_COMMUNITY)
Admission: RE | Disposition: A | Discharge status: Home Health | Payer: Self-pay | Source: Ambulatory Visit | Attending: Neurosurgery

## 2017-06-08 ENCOUNTER — Inpatient Hospital Stay (HOSPITAL_COMMUNITY): Payer: Medicare Other | Admitting: Vascular Surgery

## 2017-06-08 DIAGNOSIS — M4726 Other spondylosis with radiculopathy, lumbar region: Secondary | ICD-10-CM | POA: Diagnosis not present

## 2017-06-08 DIAGNOSIS — S32022A Unstable burst fracture of second lumbar vertebra, initial encounter for closed fracture: Secondary | ICD-10-CM | POA: Diagnosis not present

## 2017-06-08 DIAGNOSIS — Z981 Arthrodesis status: Secondary | ICD-10-CM

## 2017-06-08 DIAGNOSIS — M545 Low back pain: Secondary | ICD-10-CM | POA: Diagnosis not present

## 2017-06-08 DIAGNOSIS — W19XXXA Unspecified fall, initial encounter: Secondary | ICD-10-CM | POA: Diagnosis present

## 2017-06-08 DIAGNOSIS — Z7982 Long term (current) use of aspirin: Secondary | ICD-10-CM

## 2017-06-08 DIAGNOSIS — H409 Unspecified glaucoma: Secondary | ICD-10-CM | POA: Diagnosis not present

## 2017-06-08 DIAGNOSIS — I10 Essential (primary) hypertension: Secondary | ICD-10-CM | POA: Diagnosis not present

## 2017-06-08 DIAGNOSIS — S32001A Stable burst fracture of unspecified lumbar vertebra, initial encounter for closed fracture: Secondary | ICD-10-CM | POA: Diagnosis present

## 2017-06-08 DIAGNOSIS — R413 Other amnesia: Secondary | ICD-10-CM | POA: Diagnosis present

## 2017-06-08 DIAGNOSIS — Z885 Allergy status to narcotic agent status: Secondary | ICD-10-CM | POA: Diagnosis not present

## 2017-06-08 DIAGNOSIS — K219 Gastro-esophageal reflux disease without esophagitis: Secondary | ICD-10-CM | POA: Diagnosis present

## 2017-06-08 DIAGNOSIS — Z880 Allergy status to penicillin: Secondary | ICD-10-CM

## 2017-06-08 DIAGNOSIS — Z79899 Other long term (current) drug therapy: Secondary | ICD-10-CM

## 2017-06-08 DIAGNOSIS — M4606 Spinal enthesopathy, lumbar region: Secondary | ICD-10-CM | POA: Diagnosis present

## 2017-06-08 DIAGNOSIS — E785 Hyperlipidemia, unspecified: Secondary | ICD-10-CM | POA: Diagnosis present

## 2017-06-08 DIAGNOSIS — M4186 Other forms of scoliosis, lumbar region: Secondary | ICD-10-CM | POA: Diagnosis not present

## 2017-06-08 DIAGNOSIS — Z87891 Personal history of nicotine dependence: Secondary | ICD-10-CM

## 2017-06-08 DIAGNOSIS — M069 Rheumatoid arthritis, unspecified: Secondary | ICD-10-CM | POA: Diagnosis not present

## 2017-06-08 DIAGNOSIS — Z8249 Family history of ischemic heart disease and other diseases of the circulatory system: Secondary | ICD-10-CM | POA: Diagnosis not present

## 2017-06-08 DIAGNOSIS — I480 Paroxysmal atrial fibrillation: Secondary | ICD-10-CM | POA: Diagnosis not present

## 2017-06-08 DIAGNOSIS — Z952 Presence of prosthetic heart valve: Secondary | ICD-10-CM | POA: Diagnosis not present

## 2017-06-08 DIAGNOSIS — S32000A Wedge compression fracture of unspecified lumbar vertebra, initial encounter for closed fracture: Secondary | ICD-10-CM | POA: Diagnosis not present

## 2017-06-08 DIAGNOSIS — I739 Peripheral vascular disease, unspecified: Secondary | ICD-10-CM | POA: Diagnosis present

## 2017-06-08 DIAGNOSIS — I2510 Atherosclerotic heart disease of native coronary artery without angina pectoris: Secondary | ICD-10-CM | POA: Diagnosis present

## 2017-06-08 DIAGNOSIS — Z96652 Presence of left artificial knee joint: Secondary | ICD-10-CM | POA: Diagnosis present

## 2017-06-08 DIAGNOSIS — G629 Polyneuropathy, unspecified: Secondary | ICD-10-CM | POA: Diagnosis present

## 2017-06-08 DIAGNOSIS — M48061 Spinal stenosis, lumbar region without neurogenic claudication: Secondary | ICD-10-CM | POA: Diagnosis present

## 2017-06-08 DIAGNOSIS — Z419 Encounter for procedure for purposes other than remedying health state, unspecified: Secondary | ICD-10-CM

## 2017-06-08 DIAGNOSIS — S32041A Stable burst fracture of fourth lumbar vertebra, initial encounter for closed fracture: Secondary | ICD-10-CM | POA: Diagnosis not present

## 2017-06-08 DIAGNOSIS — G8929 Other chronic pain: Secondary | ICD-10-CM | POA: Diagnosis present

## 2017-06-08 DIAGNOSIS — M4326 Fusion of spine, lumbar region: Secondary | ICD-10-CM | POA: Diagnosis not present

## 2017-06-08 DIAGNOSIS — Z01812 Encounter for preprocedural laboratory examination: Secondary | ICD-10-CM

## 2017-06-08 DIAGNOSIS — M5416 Radiculopathy, lumbar region: Secondary | ICD-10-CM | POA: Diagnosis not present

## 2017-06-08 HISTORY — PX: LUMBAR FUSION: SHX111

## 2017-06-08 LAB — PREPARE RBC (CROSSMATCH)

## 2017-06-08 SURGERY — POSTERIOR LUMBAR FUSION 1 WITH HARDWARE REMOVAL
Anesthesia: General | Site: Spine Lumbar | Laterality: Right

## 2017-06-08 MED ORDER — BRIMONIDINE TARTRATE 0.2 % OP SOLN
1.0000 [drp] | Freq: Every morning | OPHTHALMIC | Status: DC
  Administered 2017-06-09 – 2017-06-10 (×2): 1 [drp] via OPHTHALMIC
  Filled 2017-06-08: qty 5

## 2017-06-08 MED ORDER — ROCURONIUM BROMIDE 10 MG/ML (PF) SYRINGE
PREFILLED_SYRINGE | INTRAVENOUS | Status: DC | PRN
  Administered 2017-06-08: 50 mg via INTRAVENOUS
  Administered 2017-06-08: 30 mg via INTRAVENOUS

## 2017-06-08 MED ORDER — SODIUM CHLORIDE 0.9% FLUSH: 3.0000 mL | INTRAVENOUS | Status: DC | PRN

## 2017-06-08 MED ORDER — BACITRACIN 50000 UNITS IM SOLR
INTRAMUSCULAR | Status: DC | PRN
  Administered 2017-06-08: 14:00:00

## 2017-06-08 MED ORDER — PHENYLEPHRINE HCL 10 MG/ML IJ SOLN
INTRAMUSCULAR | Status: DC | PRN
  Administered 2017-06-08: 15 ug/min via INTRAVENOUS

## 2017-06-08 MED ORDER — FOLIC ACID 1 MG PO TABS
1.0000 mg | ORAL_TABLET | Freq: Two times a day (BID) | ORAL | Status: DC
  Administered 2017-06-08 – 2017-06-10 (×4): 1 mg via ORAL
  Filled 2017-06-08 (×4): qty 1

## 2017-06-08 MED ORDER — CHLORHEXIDINE GLUCONATE CLOTH 2 % EX PADS: 6.0000 | MEDICATED_PAD | Freq: Once | CUTANEOUS | Status: DC

## 2017-06-08 MED ORDER — ACETAMINOPHEN 10 MG/ML IV SOLN
INTRAVENOUS | Status: DC | PRN
  Administered 2017-06-08: 1000 mg via INTRAVENOUS

## 2017-06-08 MED ORDER — PROMETHAZINE HCL 25 MG/ML IJ SOLN: 6.2500 mg | INTRAMUSCULAR | Status: DC | PRN

## 2017-06-08 MED ORDER — HYDROMORPHONE HCL 1 MG/ML IJ SOLN
0.2500 mg | INTRAMUSCULAR | Status: DC | PRN
  Administered 2017-06-08 (×2): 0.25 mg via INTRAVENOUS

## 2017-06-08 MED ORDER — ACETAMINOPHEN 325 MG PO TABS
650.0000 mg | ORAL_TABLET | ORAL | Status: DC | PRN
  Administered 2017-06-10: 650 mg via ORAL
  Filled 2017-06-08: qty 2

## 2017-06-08 MED ORDER — VANCOMYCIN HCL 1000 MG IV SOLR
INTRAVENOUS | Status: DC | PRN
  Administered 2017-06-08: 1000 mg via TOPICAL

## 2017-06-08 MED ORDER — GABAPENTIN 400 MG PO CAPS
400.0000 mg | ORAL_CAPSULE | Freq: Three times a day (TID) | ORAL | Status: DC
  Administered 2017-06-08 – 2017-06-10 (×6): 400 mg via ORAL
  Filled 2017-06-08 (×6): qty 1

## 2017-06-08 MED ORDER — PANTOPRAZOLE SODIUM 40 MG PO TBEC
40.0000 mg | DELAYED_RELEASE_TABLET | Freq: Every day | ORAL | Status: DC
  Administered 2017-06-08 – 2017-06-09 (×2): 40 mg via ORAL
  Filled 2017-06-08 (×2): qty 1

## 2017-06-08 MED ORDER — PANTOPRAZOLE SODIUM 40 MG IV SOLR: 40.0000 mg | Freq: Every day | INTRAVENOUS | Status: DC

## 2017-06-08 MED ORDER — ONDANSETRON HCL 4 MG/2ML IJ SOLN
INTRAMUSCULAR | Status: DC | PRN
  Administered 2017-06-08: 4 mg via INTRAVENOUS

## 2017-06-08 MED ORDER — PHENOL 1.4 % MT LIQD: 1.0000 | OROMUCOSAL | Status: DC | PRN

## 2017-06-08 MED ORDER — 0.9 % SODIUM CHLORIDE (POUR BTL) OPTIME
TOPICAL | Status: DC | PRN
  Administered 2017-06-08: 1000 mL

## 2017-06-08 MED ORDER — FLUOXETINE HCL 20 MG PO CAPS
20.0000 mg | ORAL_CAPSULE | Freq: Every day | ORAL | Status: DC
  Administered 2017-06-09 – 2017-06-10 (×2): 20 mg via ORAL
  Filled 2017-06-08 (×2): qty 1

## 2017-06-08 MED ORDER — OXYMETAZOLINE HCL 0.05 % NA SOLN
1.0000 | Freq: Every day | NASAL | Status: DC | PRN
  Administered 2017-06-10: 1 via NASAL
  Filled 2017-06-08: qty 15

## 2017-06-08 MED ORDER — TAMSULOSIN HCL 0.4 MG PO CAPS
0.4000 mg | ORAL_CAPSULE | Freq: Every day | ORAL | Status: DC
  Administered 2017-06-08 – 2017-06-10 (×3): 0.4 mg via ORAL
  Filled 2017-06-08 (×3): qty 1

## 2017-06-08 MED ORDER — LIDOCAINE-EPINEPHRINE 1 %-1:100000 IJ SOLN
INTRAMUSCULAR | Status: AC
  Filled 2017-06-08: qty 1

## 2017-06-08 MED ORDER — FENTANYL CITRATE (PF) 250 MCG/5ML IJ SOLN
INTRAMUSCULAR | Status: AC
  Filled 2017-06-08: qty 5

## 2017-06-08 MED ORDER — ASPIRIN EC 325 MG PO TBEC
325.0000 mg | DELAYED_RELEASE_TABLET | Freq: Every day | ORAL | Status: DC
  Administered 2017-06-09 – 2017-06-10 (×2): 325 mg via ORAL
  Filled 2017-06-08 (×2): qty 1

## 2017-06-08 MED ORDER — AMOXICILLIN 500 MG PO CAPS: 2000.0000 mg | ORAL_CAPSULE | ORAL | Status: DC

## 2017-06-08 MED ORDER — ALUM & MAG HYDROXIDE-SIMETH 200-200-20 MG/5ML PO SUSP: 30.0000 mL | Freq: Four times a day (QID) | ORAL | Status: DC | PRN

## 2017-06-08 MED ORDER — BUPIVACAINE LIPOSOME 1.3 % IJ SUSP
INTRAMUSCULAR | Status: DC | PRN
  Administered 2017-06-08: 20 mL

## 2017-06-08 MED ORDER — PROPOFOL 10 MG/ML IV BOLUS
INTRAVENOUS | Status: DC | PRN
  Administered 2017-06-08: 130 mg via INTRAVENOUS

## 2017-06-08 MED ORDER — TRAZODONE HCL 50 MG PO TABS
50.0000 mg | ORAL_TABLET | Freq: Every day | ORAL | Status: DC
  Administered 2017-06-08 – 2017-06-09 (×2): 100 mg via ORAL
  Filled 2017-06-08 (×2): qty 2

## 2017-06-08 MED ORDER — THROMBIN 5000 UNITS EX SOLR
CUTANEOUS | Status: AC
  Filled 2017-06-08: qty 5000

## 2017-06-08 MED ORDER — BUPIVACAINE LIPOSOME 1.3 % IJ SUSP
20.0000 mL | INTRAMUSCULAR | Status: DC
Start: 2017-06-08 — End: 2017-06-08
  Filled 2017-06-08: qty 20

## 2017-06-08 MED ORDER — LORATADINE 10 MG PO TABS
10.0000 mg | ORAL_TABLET | Freq: Every day | ORAL | Status: DC
  Administered 2017-06-09 – 2017-06-10 (×2): 10 mg via ORAL
  Filled 2017-06-08 (×2): qty 1

## 2017-06-08 MED ORDER — VANCOMYCIN HCL 1000 MG IV SOLR
INTRAVENOUS | Status: AC
  Filled 2017-06-08: qty 1000

## 2017-06-08 MED ORDER — DEXAMETHASONE SODIUM PHOSPHATE 10 MG/ML IJ SOLN
INTRAMUSCULAR | Status: DC | PRN
  Administered 2017-06-08: 10 mg via INTRAVENOUS

## 2017-06-08 MED ORDER — SURGIFOAM 100 EX MISC
CUTANEOUS | Status: DC | PRN
  Administered 2017-06-08: 14:00:00 via TOPICAL

## 2017-06-08 MED ORDER — SODIUM CHLORIDE 0.9 % IV SOLN: 250.0000 mL | INTRAVENOUS | Status: DC

## 2017-06-08 MED ORDER — CYCLOBENZAPRINE HCL 10 MG PO TABS
10.0000 mg | ORAL_TABLET | Freq: Three times a day (TID) | ORAL | Status: DC | PRN
  Administered 2017-06-08 – 2017-06-09 (×3): 10 mg via ORAL
  Filled 2017-06-08 (×3): qty 1

## 2017-06-08 MED ORDER — FENTANYL CITRATE (PF) 250 MCG/5ML IJ SOLN
INTRAMUSCULAR | Status: DC | PRN
  Administered 2017-06-08: 250 ug via INTRAVENOUS
  Administered 2017-06-08 (×2): 50 ug via INTRAVENOUS

## 2017-06-08 MED ORDER — MIDAZOLAM HCL 2 MG/2ML IJ SOLN: 0.5000 mg | Freq: Once | INTRAMUSCULAR | Status: DC | PRN

## 2017-06-08 MED ORDER — DONEPEZIL HCL 10 MG PO TABS
10.0000 mg | ORAL_TABLET | Freq: Every day | ORAL | Status: DC
  Administered 2017-06-08 – 2017-06-09 (×2): 10 mg via ORAL
  Filled 2017-06-08 (×2): qty 1

## 2017-06-08 MED ORDER — LACTATED RINGERS IV SOLN
INTRAVENOUS | Status: DC
  Administered 2017-06-08: 10:00:00 via INTRAVENOUS

## 2017-06-08 MED ORDER — MENTHOL 3 MG MT LOZG: 1.0000 | LOZENGE | OROMUCOSAL | Status: DC | PRN

## 2017-06-08 MED ORDER — HYDROMORPHONE HCL 1 MG/ML IJ SOLN
0.5000 mg | INTRAMUSCULAR | Status: DC | PRN
  Filled 2017-06-08: qty 0.5

## 2017-06-08 MED ORDER — THROMBIN 20000 UNITS EX SOLR
CUTANEOUS | Status: AC
  Filled 2017-06-08: qty 20000

## 2017-06-08 MED ORDER — AMLODIPINE BESYLATE 5 MG PO TABS
5.0000 mg | ORAL_TABLET | Freq: Every day | ORAL | Status: DC
  Administered 2017-06-09 – 2017-06-10 (×2): 5 mg via ORAL
  Filled 2017-06-08 (×2): qty 1

## 2017-06-08 MED ORDER — PROPOFOL 10 MG/ML IV BOLUS
INTRAVENOUS | Status: AC
  Filled 2017-06-08: qty 20

## 2017-06-08 MED ORDER — MIRABEGRON ER 25 MG PO TB24
50.0000 mg | ORAL_TABLET | Freq: Every day | ORAL | Status: DC
Start: 2017-06-09 — End: 2017-06-10
  Administered 2017-06-09: 50 mg via ORAL
  Filled 2017-06-08 (×2): qty 2

## 2017-06-08 MED ORDER — ACETAMINOPHEN 650 MG RE SUPP
650.0000 mg | RECTAL | Status: DC | PRN
Start: 2017-06-08 — End: 2017-06-10

## 2017-06-08 MED ORDER — LIDOCAINE-EPINEPHRINE 1 %-1:100000 IJ SOLN
INTRAMUSCULAR | Status: DC | PRN
  Administered 2017-06-08: 9 mL

## 2017-06-08 MED ORDER — LIDOCAINE 2% (20 MG/ML) 5 ML SYRINGE
INTRAMUSCULAR | Status: DC | PRN
  Administered 2017-06-08: 20 mg via INTRAVENOUS

## 2017-06-08 MED ORDER — SODIUM CHLORIDE 0.9 % IV SOLN: Freq: Once | INTRAVENOUS | Status: AC

## 2017-06-08 MED ORDER — SUGAMMADEX SODIUM 200 MG/2ML IV SOLN
INTRAVENOUS | Status: DC | PRN
  Administered 2017-06-08: 161.4 mg via INTRAVENOUS

## 2017-06-08 MED ORDER — ONDANSETRON HCL 4 MG/2ML IJ SOLN: 4.0000 mg | Freq: Four times a day (QID) | INTRAMUSCULAR | Status: DC | PRN

## 2017-06-08 MED ORDER — ONDANSETRON HCL 4 MG PO TABS: 4.0000 mg | ORAL_TABLET | Freq: Four times a day (QID) | ORAL | Status: DC | PRN

## 2017-06-08 MED ORDER — OXYCODONE HCL 5 MG PO TABS
10.0000 mg | ORAL_TABLET | ORAL | Status: DC | PRN
  Administered 2017-06-08 – 2017-06-10 (×5): 10 mg via ORAL
  Filled 2017-06-08 (×5): qty 2

## 2017-06-08 MED ORDER — FENOFIBRATE 160 MG PO TABS
160.0000 mg | ORAL_TABLET | Freq: Every day | ORAL | Status: DC
  Administered 2017-06-08 – 2017-06-10 (×3): 160 mg via ORAL
  Filled 2017-06-08 (×3): qty 1

## 2017-06-08 MED ORDER — ACETAMINOPHEN 10 MG/ML IV SOLN
INTRAVENOUS | Status: AC
  Filled 2017-06-08: qty 100

## 2017-06-08 MED ORDER — HYDROCODONE-ACETAMINOPHEN 10-325 MG PO TABS
1.0000 | ORAL_TABLET | ORAL | Status: DC | PRN
  Administered 2017-06-09 (×2): 1 via ORAL
  Filled 2017-06-08 (×2): qty 1

## 2017-06-08 MED ORDER — VANCOMYCIN HCL 10 G IV SOLR
1500.0000 mg | INTRAVENOUS | Status: DC
  Administered 2017-06-09 – 2017-06-10 (×2): 1500 mg via INTRAVENOUS
  Filled 2017-06-08 (×2): qty 1500

## 2017-06-08 MED ORDER — LACTATED RINGERS IV SOLN
INTRAVENOUS | Status: DC | PRN
  Administered 2017-06-08 (×2): via INTRAVENOUS

## 2017-06-08 MED ORDER — LISINOPRIL 20 MG PO TABS
20.0000 mg | ORAL_TABLET | Freq: Every evening | ORAL | Status: DC
  Administered 2017-06-08: 20 mg via ORAL
  Filled 2017-06-08: qty 1

## 2017-06-08 MED ORDER — ENSURE ENLIVE PO LIQD
237.0000 mL | Freq: Two times a day (BID) | ORAL | Status: DC
  Administered 2017-06-09 – 2017-06-10 (×3): 237 mL via ORAL

## 2017-06-08 MED ORDER — SODIUM CHLORIDE 0.9% FLUSH
3.0000 mL | Freq: Two times a day (BID) | INTRAVENOUS | Status: DC
  Administered 2017-06-09 – 2017-06-10 (×3): 3 mL via INTRAVENOUS

## 2017-06-08 MED ORDER — THROMBIN (RECOMBINANT) 5000 UNITS EX SOLR
CUTANEOUS | Status: DC | PRN
  Administered 2017-06-08: 14:00:00 via TOPICAL

## 2017-06-08 MED ORDER — HYDROMORPHONE HCL 1 MG/ML IJ SOLN
INTRAMUSCULAR | Status: AC
  Filled 2017-06-08: qty 1

## 2017-06-08 MED ORDER — ATORVASTATIN CALCIUM 40 MG PO TABS
40.0000 mg | ORAL_TABLET | Freq: Every day | ORAL | Status: DC
  Administered 2017-06-08 – 2017-06-09 (×2): 40 mg via ORAL
  Filled 2017-06-08 (×2): qty 1

## 2017-06-08 SURGICAL SUPPLY — 79 items
BAG DECANTER FOR FLEXI CONT (MISCELLANEOUS) ×2 IMPLANT
BENZOIN TINCTURE PRP APPL 2/3 (GAUZE/BANDAGES/DRESSINGS) ×2 IMPLANT
BLADE CLIPPER SURG (BLADE) IMPLANT
BLADE SURG 11 STRL SS (BLADE) ×2 IMPLANT
BONE VIVIGEN FORMABLE 5.4CC (Bone Implant) ×4 IMPLANT
BUR CUTTER 7.0 ROUND (BURR) ×2 IMPLANT
BUR MATCHSTICK NEURO 3.0 LAGG (BURR) ×2 IMPLANT
CANISTER SUCT 3000ML PPV (MISCELLANEOUS) ×2 IMPLANT
CAP LOCKING THREADED (Cap) ×14 IMPLANT
CARTRIDGE OIL MAESTRO DRILL (MISCELLANEOUS) ×1 IMPLANT
CONT SPEC 4OZ CLIKSEAL STRL BL (MISCELLANEOUS) ×2 IMPLANT
COVER BACK TABLE 24X17X13 BIG (DRAPES) IMPLANT
COVER BACK TABLE 60X90IN (DRAPES) ×2 IMPLANT
DECANTER SPIKE VIAL GLASS SM (MISCELLANEOUS) ×2 IMPLANT
DERMABOND ADVANCED (GAUZE/BANDAGES/DRESSINGS) ×1
DERMABOND ADVANCED .7 DNX12 (GAUZE/BANDAGES/DRESSINGS) ×1 IMPLANT
DIFFUSER DRILL AIR PNEUMATIC (MISCELLANEOUS) ×2 IMPLANT
DRAPE C-ARM 42X72 X-RAY (DRAPES) ×4 IMPLANT
DRAPE C-ARMOR (DRAPES) IMPLANT
DRAPE HALF SHEET 40X57 (DRAPES) IMPLANT
DRAPE LAPAROTOMY 100X72X124 (DRAPES) ×2 IMPLANT
DRAPE POUCH INSTRU U-SHP 10X18 (DRAPES) ×2 IMPLANT
DRAPE SURG 17X23 STRL (DRAPES) ×2 IMPLANT
DRSG OPSITE 4X5.5 SM (GAUZE/BANDAGES/DRESSINGS) ×2 IMPLANT
DRSG OPSITE POSTOP 4X8 (GAUZE/BANDAGES/DRESSINGS) ×2 IMPLANT
DURAPREP 26ML APPLICATOR (WOUND CARE) ×2 IMPLANT
ELECT REM PT RETURN 9FT ADLT (ELECTROSURGICAL) ×2
ELECTRODE REM PT RTRN 9FT ADLT (ELECTROSURGICAL) ×1 IMPLANT
EVACUATOR 3/16  PVC DRAIN (DRAIN) ×1
EVACUATOR 3/16 PVC DRAIN (DRAIN) ×1 IMPLANT
FIBER BOAT ALLOFUSE 15CC (Bone Implant) ×2 IMPLANT
GAUZE SPONGE 4X4 12PLY STRL (GAUZE/BANDAGES/DRESSINGS) ×2 IMPLANT
GAUZE SPONGE 4X4 16PLY XRAY LF (GAUZE/BANDAGES/DRESSINGS) IMPLANT
GLOVE BIO SURGEON STRL SZ7 (GLOVE) IMPLANT
GLOVE BIO SURGEON STRL SZ8 (GLOVE) ×4 IMPLANT
GLOVE BIOGEL PI IND STRL 7.0 (GLOVE) IMPLANT
GLOVE BIOGEL PI IND STRL 7.5 (GLOVE) ×2 IMPLANT
GLOVE BIOGEL PI IND STRL 8 (GLOVE) ×3 IMPLANT
GLOVE BIOGEL PI INDICATOR 7.0 (GLOVE)
GLOVE BIOGEL PI INDICATOR 7.5 (GLOVE) ×2
GLOVE BIOGEL PI INDICATOR 8 (GLOVE) ×3
GLOVE ECLIPSE 6.5 STRL STRAW (GLOVE) ×2 IMPLANT
GLOVE ECLIPSE 7.5 STRL STRAW (GLOVE) ×10 IMPLANT
GLOVE EXAM NITRILE LRG STRL (GLOVE) IMPLANT
GLOVE EXAM NITRILE XL STR (GLOVE) IMPLANT
GLOVE EXAM NITRILE XS STR PU (GLOVE) IMPLANT
GLOVE INDICATOR 8.5 STRL (GLOVE) ×4 IMPLANT
GOWN STRL REUS W/ TWL LRG LVL3 (GOWN DISPOSABLE) IMPLANT
GOWN STRL REUS W/ TWL XL LVL3 (GOWN DISPOSABLE) ×2 IMPLANT
GOWN STRL REUS W/TWL 2XL LVL3 (GOWN DISPOSABLE) IMPLANT
GOWN STRL REUS W/TWL LRG LVL3 (GOWN DISPOSABLE)
GOWN STRL REUS W/TWL XL LVL3 (GOWN DISPOSABLE) ×2
HEMOSTAT POWDER KIT SURGIFOAM (HEMOSTASIS) IMPLANT
KIT BASIN OR (CUSTOM PROCEDURE TRAY) ×2 IMPLANT
KIT INFUSE SMALL (Orthopedic Implant) ×2 IMPLANT
KIT ROOM TURNOVER OR (KITS) ×2 IMPLANT
MILL MEDIUM DISP (BLADE) ×2 IMPLANT
NEEDLE HYPO 21X1.5 SAFETY (NEEDLE) ×2 IMPLANT
NEEDLE HYPO 25X1 1.5 SAFETY (NEEDLE) ×2 IMPLANT
NS IRRIG 1000ML POUR BTL (IV SOLUTION) ×2 IMPLANT
OIL CARTRIDGE MAESTRO DRILL (MISCELLANEOUS) ×2
PACK LAMINECTOMY NEURO (CUSTOM PROCEDURE TRAY) ×2 IMPLANT
PAD ARMBOARD 7.5X6 YLW CONV (MISCELLANEOUS) ×6 IMPLANT
ROD 100MM (Rod) ×2 IMPLANT
ROD SPNL STRG 100X5.5XNS (Rod) ×2 IMPLANT
SCREW AMP MODULAR CREO 6.5X45 (Screw) ×6 IMPLANT
SCREW PA THRD CREO TULIP 5.5X4 (Head) ×6 IMPLANT
SPONGE LAP 4X18 X RAY DECT (DISPOSABLE) IMPLANT
SPONGE SURGIFOAM ABS GEL 100 (HEMOSTASIS) ×2 IMPLANT
STRIP CLOSURE SKIN 1/2X4 (GAUZE/BANDAGES/DRESSINGS) ×2 IMPLANT
SUT VIC AB 0 CT1 18XCR BRD8 (SUTURE) ×2 IMPLANT
SUT VIC AB 0 CT1 8-18 (SUTURE) ×2
SUT VIC AB 2-0 CT1 18 (SUTURE) ×2 IMPLANT
SUT VIC AB 4-0 PS2 27 (SUTURE) ×2 IMPLANT
SYR 20CC LL (SYRINGE) IMPLANT
TOWEL GREEN STERILE (TOWEL DISPOSABLE) ×2 IMPLANT
TOWEL GREEN STERILE FF (TOWEL DISPOSABLE) ×2 IMPLANT
TRAY FOLEY W/METER SILVER 16FR (SET/KITS/TRAYS/PACK) ×2 IMPLANT
WATER STERILE IRR 1000ML POUR (IV SOLUTION) ×2 IMPLANT

## 2017-06-08 NOTE — Progress Notes (Signed)
Pharmacy Antibiotic Note  Bryan [name] is a 77 y.o. male admitted on 06/08/2017 s/p back surgery with drain in place.  Pharmacy has been consulted for vancomycin dosing.  Plan: Vancomycin 1500 mg iv Q 24 hours DC vancomycin when drain removed  Height: 6' (182.9 cm) Weight: 178 lb (80.7 kg) IBW/kg (Calculated) : 77.6  Temp (24hrs), Avg:97.7 F (36.5 C), Min:97 F (36.1 C), Max:98.1 F (36.7 C)  Recent Labs  Lab 06/06/17 1050  WBC 3.1*  CREATININE 1.03    Estimated Creatinine Clearance: 64.9 mL/min (by C-G formula based on SCr of 1.03 mg/dL).    Allergies  Allergen Reactions  . Penicillins Hives and Swelling    SWELLING REACTION UNSPECIFIED PATIENT HAS TAKEN AMOXICILLIN ON MED HX FROM [facility] PATIENT HAS HAD A PCN REACTION WITH IMMEDIATE RASH, FACIAL/TONGUE/THROAT SWELLING, SOB, OR LIGHTHEADEDNESS WITH HYPOTENSION:  #  #  #  YES  #  #  #   Has patient had a PCN reaction causing severe rash involving mucus membranes or skin necrosis: No Has patient had a PCN reaction that required hospitalization No Has patient had a PCN reaction occurring within the last 10 years: No  . Demerol [Meperidine] Hives and Nausea And Vomiting      Thank you for allowing pharmacy to be a part of this patient's care. Anette Guarneri, PharmD [phone] 06/08/2017 5:17 PM

## 2017-06-08 NOTE — Progress Notes (Signed)
Pt admitted to the unit from pacu; pt A&O x4; ambulated from stretcher to bed; foley intact and unclamped; hemavac charged and intact; back incision has honeycomb dsg intact with scant blood stain. Pt neuro check intact; pt oriented to the unit and room; fall safety/preacution and prevention education completed. Bed alarm on; call light within reach. Will closely monitor pt. Delia Heady RN

## 2017-06-08 NOTE — Anesthesia Procedure Notes (Signed)
Procedure Name: Intubation Date/Time: 06/08/2017 11:54 AM Performed by: Clearnce Sorrel, CRNA Pre-anesthesia Checklist: Patient identified Patient Re-evaluated:Patient Re-evaluated prior to induction Oxygen Delivery Method: Circle system utilized Preoxygenation: Pre-oxygenation with 100% oxygen Induction Type: IV induction Ventilation: Mask ventilation without difficulty Grade View: Grade I Tube type: Oral Tube size: 7.5 mm Number of attempts: 1 Airway Equipment and Method: Stylet Secured at: 21 cm Tube secured with: Tape Dental Injury: Teeth and Oropharynx as per pre-operative assessment

## 2017-06-08 NOTE — H&P (Signed)
Bryan [name] is an 77 y.o. male.   Chief Complaint: Back and right leg pain HPI: 77 year old gentleman who's had long-standing back and right leg pain previous) L5-S1 fusion did very well however last several months of progressive pain and progressive worse both in his back as well as right L5. Workup revealed an L4 compression deformity. Patient went underwent epidural steroid injections as well as a kyphoplasty through the right pedicle at L4. None of these alleviate his radicular symptoms and had progressive worsening back pain. So due to his progression I recommended reexploration of his fusion removal of hardware a right-sided L4-5 laminotomy and decompression with pedicle screw fixation L3-L5. I've extensively gone over the risks and benefits of the operation with him as well as perioperative course expectations of outcome and alternatives surgery and he understands and agrees to proceed forward.  Past Medical History:  Diagnosis Date  . Abnormal CT scan    Asymmetric left rectal wall thickening   . Allergy   . Anemia   . Arrhythmia   . Chronic lower back pain   . Complication of anesthesia    Memory loss 09/2015  . Coronary artery disease   . GERD (gastroesophageal reflux disease)   . Glaucoma   . H/O thoracic aortic aneurysm repair   . History of being hospitalized    memory lose kidney funtion down blood pressure up  . History of blood transfusion    "think he had one when he had heart valve OR" (10/14/2016)  . History of chicken pox   . Hyperlipidemia   . Hypertension   . Lumbar stenosis   . Neuropathy   . Osteoarthritis    worse in feet and ankles  . Paroxysmal A-fib ([place])   . Poor short term memory    takes Aricept  . Rheumatoid arthritis ([place])    "all over" (10/14/2016)  . Valvular heart disease     Past Surgical History:  Procedure Laterality Date  . AORTIC VALVE REPLACEMENT  2007   [facility].  [place], [place]  . APPENDECTOMY  2010  . BACK SURGERY    .  CARDIAC VALVE REPLACEMENT    . CATARACT EXTRACTION W/ INTRAOCULAR LENS  IMPLANT, BILATERAL Bilateral   . COLONOSCOPY WITH PROPOFOL N/A 09/24/2016   Procedure: COLONOSCOPY WITH PROPOFOL;  Surgeon: Jonathon Bellows, MD;  Location: [facility] ENDOSCOPY;  Service: Endoscopy;  Laterality: N/A;  . ESOPHAGOGASTRODUODENOSCOPY (EGD) WITH PROPOFOL N/A 09/24/2016   Procedure: ESOPHAGOGASTRODUODENOSCOPY (EGD) WITH PROPOFOL;  Surgeon: Jonathon Bellows, MD;  Location: [facility] ENDOSCOPY;  Service: Endoscopy;  Laterality: N/A;  . GIVENS CAPSULE STUDY N/A 09/24/2016   Procedure: GIVENS CAPSULE STUDY;  Surgeon: Jonathon Bellows, MD;  Location: [facility] ENDOSCOPY;  Service: Endoscopy;  Laterality: N/A;  . HAMMER TOE SURGERY Right 10/09/2015   Procedure: HAMMER TOE REPAIR WITH K-WIRE FIXATION RIGHT SECOND TOE;  Surgeon: Albertine Patricia, DPM;  Location: [place];  Service: Podiatry;  Laterality: Right;  WITH LOCAL  . JOINT REPLACEMENT Left 2008   knee  . LAPAROSCOPIC CHOLECYSTECTOMY  2010  . LUMBAR LAMINECTOMY/DECOMPRESSION MICRODISCECTOMY Right 03/08/2016   Procedure: Laminectomy and Foraminotomy - Lumbar Five-Sacral One Right;  Surgeon: Kary Kos, MD;  Location: [place];  Service: Neurosurgery;  Laterality: Right;  Right  . MULTIPLE TOOTH EXTRACTIONS     "3"  . POSTERIOR LUMBAR FUSION  10/14/2016   L5-S1  . REPLACEMENT TOTAL KNEE Left 2003  . THORACIC AORTIC ANEURYSM REPAIR  2010    Family History  Problem  Relation Age of Onset  . Heart failure Mother   . Hypertension Mother   . Asthma Mother   . Heart attack Father 75       MI  . Hypertension Sister   . Prostate cancer Neg Hx   . Bladder Cancer Neg Hx   . Kidney cancer Neg Hx    Social History:  reports that he quit smoking about 35 years ago. His smoking use included cigarettes. He has a 32.00 pack-year smoking history. he has never used smokeless tobacco. He reports that he drinks alcohol. He reports that he does not use drugs.  Allergies:  Allergies  Allergen  Reactions  . Penicillins Hives and Swelling    SWELLING REACTION UNSPECIFIED PATIENT HAS TAKEN AMOXICILLIN ON MED HX FROM [facility] PATIENT HAS HAD A PCN REACTION WITH IMMEDIATE RASH, FACIAL/TONGUE/THROAT SWELLING, SOB, OR LIGHTHEADEDNESS WITH HYPOTENSION:  #  #  #  YES  #  #  #   Has patient had a PCN reaction causing severe rash involving mucus membranes or skin necrosis: No Has patient had a PCN reaction that required hospitalization No Has patient had a PCN reaction occurring within the last 10 years: No  . Demerol [Meperidine] Hives and Nausea And Vomiting    Medications Prior to Admission  Medication Sig Dispense Refill  . amLODipine (NORVASC) 5 MG tablet TAKE ONE TABLET BY MOUTH DAILY 90 tablet 1  . aspirin 325 MG EC tablet Take 325 mg by mouth daily.    Marland Kitchen atorvastatin (LIPITOR) 40 MG tablet Take 1 tablet (40 mg total) by mouth daily. (Patient taking differently: Take 40 mg by mouth daily at 6 PM. ) 90 tablet 3  . brimonidine (ALPHAGAN) 0.2 % ophthalmic solution Place 1 drop into both eyes every morning.     . cetirizine (ZYRTEC) 10 MG tablet Take 10 mg by mouth daily.    Marland Kitchen donepezil (ARICEPT) 10 MG tablet Take 10 mg by mouth at bedtime.    . fenofibrate 160 MG tablet TAKE ONE TABLET BY MOUTH EVERY DAY 90 tablet 0  . FLUoxetine (PROZAC) 20 MG capsule TAKE 1 CAPSULE BY MOUTH DAILY 90 capsule 1  . folic acid (FOLVITE) 1 MG tablet Take 1 mg by mouth 2 (two) times daily.    Marland Kitchen gabapentin (NEURONTIN) 400 MG capsule Take 400 mg by mouth 3 (three) times daily.     Marland Kitchen HYDROcodone-acetaminophen (NORCO) 10-325 MG tablet Take 1 tablet by mouth every 4 (four) hours.     . hydrocortisone cream 1 % Apply 1 application topically daily as needed for itching.    Marland Kitchen lisinopril (PRINIVIL,ZESTRIL) 20 MG tablet TAKE ONE TABLET BY MOUTH EVERY EVENING 90 tablet 3  . mirabegron ER (MYRBETRIQ) 50 MG TB24 tablet Take 1 tablet (50 mg total) by mouth daily. 90 tablet 3  . omeprazole (PRILOSEC) 40 MG capsule TAKE 1  CAPSULE BY MOUTH DAILY 30 capsule 3  . oxymetazoline (AFRIN) 0.05 % nasal spray Place 1 spray into both nostrils daily as needed for congestion.    . tamsulosin (FLOMAX) 0.4 MG CAPS capsule TAKE 1 CAPSULE BY MOUTH DAILY 90 capsule 0  . traZODone (DESYREL) 50 MG tablet Take 50-100 mg by mouth at bedtime. 1-2 tablets depending on insomnia    . amoxicillin (AMOXIL) 500 MG capsule Take 2,000 mg by mouth See admin instructions. Take 4 Capsules prior to dental appointment    . fenofibrate 160 MG tablet TAKE ONE TABLET BY MOUTH EVERY DAY (Patient not taking: Reported  on 05/30/2017) 30 tablet 0  . polyethylene glycol powder (GLYCOLAX/MIRALAX) powder USE 1 CAPFUL IN 4 TO 8 OUNCES OF LIQUID DAILY (Patient not taking: Reported on 05/30/2017) 527 g 0  . predniSONE (DELTASONE) 5 MG tablet Take 5 mg by mouth daily as needed. Reported on 11/17/2015      No results found for this or any previous visit (from the past 48 hour(s)). No results found.  Review of Systems  Musculoskeletal: Positive for back pain, joint pain and myalgias.  Neurological: Positive for sensory change.    Blood pressure (!) 150/61, pulse (!) 53, temperature 97.8 F (36.6 C), temperature source Oral, resp. rate 18, height 6' (1.829 m), weight 80.7 kg (178 lb), SpO2 97 %. Physical Exam  Constitutional: He is oriented to person, place, and time. He appears well-developed and well-nourished.  HENT:  Head: Normocephalic.  Eyes: Pupils are equal, round, and reactive to light.  Neck: Normal range of motion.  GI: Soft.  Neurological: He is alert and oriented to person, place, and time. He has normal strength. GCS eye subscore is 4. GCS verbal subscore is 5. GCS motor subscore is 6.  Strength 5 out of 5 iliopsoas, quads, hip she's, gastric, into tibialis, and EHL.  Skin: Skin is warm and dry.     Assessment/Plan 77 year old presents for a decompressive laminotomy at L4-5 on the right and pedicle screw fixation L3-S1.  Calbert Hulsebus P,  MD 06/08/2017, 11:15 AM

## 2017-06-08 NOTE — Transfer of Care (Signed)
Immediate Anesthesia Transfer of Care Note  Patient: Bryan [name]  Procedure(s) Performed: LUMBAR THREE-FOUR, LUMBAR FOUR-FIVE POSTEROLATERAL ARTHRODESIS WITH RIGHT LUMBAR FOUR-FIVE LAMINECTOMY/FORAMINOTOMY (Right Spine Lumbar)  Patient Location: PACU  Anesthesia Type:General  Level of Consciousness: awake, alert  and oriented  Airway & Oxygen Therapy: Patient Spontanous Breathing and Patient connected to nasal cannula oxygen  Post-op Assessment: Report given to RN and Post -op Vital signs reviewed and stable  Post vital signs: Reviewed and stable  Last Vitals:  Vitals:   06/08/17 0929  BP: (!) 150/61  Pulse: (!) 53  Resp: 18  Temp: 36.6 C  SpO2: 97%    Last Pain:  Vitals:   06/08/17 1009  TempSrc:   PainSc: 4       Patients Stated Pain Goal: 4 (49/75/30 0511)  Complications: No apparent anesthesia complications

## 2017-06-08 NOTE — Anesthesia Preprocedure Evaluation (Addendum)
Anesthesia Evaluation  Patient identified by MRN, date of birth, ID band Patient awake    Reviewed: Allergy & Precautions, NPO status , Patient's Chart, lab work & pertinent test results  History of Anesthesia Complications Negative for: history of anesthetic complications  Airway Mallampati: II  TM Distance: >3 FB Neck ROM: Full    Dental  (+) Dental Advisory Given   Pulmonary former smoker (quit 1983),    breath sounds clear to auscultation       Cardiovascular hypertension, Pt. on medications (-) angina+ CAD and + Peripheral Vascular Disease (s/p AVR, thoracic aneurysm repair)  + dysrhythmias Atrial Fibrillation + Valvular Problems/Murmurs (s/p AVR (bicuspid valve))  Rhythm:Regular Rate:Normal  '17 ECHO: EF 50-55%, moderate aortic stenosis. Mean gradient (S): 20 mm Hg. Valve area (VTI): 1.24 cm^2, mod pulm HTN '09 cardiac cath: mild to moderate CAD   Neuro/Psych Chronic back pain    GI/Hepatic Neg liver ROS, GERD  Medicated and Controlled,  Endo/Other  negative endocrine ROS  Renal/GU Renal InsufficiencyRenal disease     Musculoskeletal  (+) Arthritis , Rheumatoid disorders,    Abdominal   Peds  Hematology  (+) Blood dyscrasia (h/o thrombocytopenia: plt 123k), , Hb 10.3   Anesthesia Other Findings   Reproductive/Obstetrics                           Anesthesia Physical Anesthesia Plan  ASA: III  Anesthesia Plan: General   Post-op Pain Management:    Induction: Intravenous  PONV Risk Score and Plan: 3 and Ondansetron, Dexamethasone and Treatment may vary due to age or medical condition  Airway Management Planned: Oral ETT  Additional Equipment:   Intra-op Plan:   Post-operative Plan: Extubation in OR  Informed Consent: I have reviewed the patients History and Physical, chart, labs and discussed the procedure including the risks, benefits and alternatives for the proposed  anesthesia with the patient or authorized representative who has indicated his/her understanding and acceptance.   Dental advisory given  Plan Discussed with: CRNA and Surgeon  Anesthesia Plan Comments: (Plan routine monitors, GETA  Probable transfusion, possible A-line if need frequent Hb sampling)        Anesthesia Quick Evaluation

## 2017-06-08 NOTE — Anesthesia Postprocedure Evaluation (Signed)
Anesthesia Post Note  Patient: [name]  Procedure(s) Performed: LUMBAR THREE-FOUR, LUMBAR FOUR-FIVE POSTEROLATERAL ARTHRODESIS WITH RIGHT LUMBAR FOUR-FIVE LAMINECTOMY/FORAMINOTOMY (Right Spine Lumbar)     Patient location during evaluation: PACU Anesthesia Type: General Level of consciousness: sedated, patient cooperative and oriented Pain management: pain level controlled Vital Signs Assessment: post-procedure vital signs reviewed and stable Respiratory status: spontaneous breathing, nonlabored ventilation, respiratory function stable and patient connected to nasal cannula oxygen Cardiovascular status: blood pressure returned to baseline and stable Postop Assessment: no apparent nausea or vomiting Anesthetic complications: no    Last Vitals:  Vitals:   06/08/17 1452 06/08/17 1506  BP: 125/64 (!) 152/67  Pulse: 62 61  Resp: 19 14  Temp:    SpO2: 100% 100%    Last Pain:  Vitals:   06/08/17 1515  TempSrc:   PainSc: 10-Worst pain ever                 Kayann Maj,E. Skylan Gift

## 2017-06-08 NOTE — Op Note (Signed)
Preoperative diagnosis: L4 burst fracture right L5 radiculopathywith degenerative lumbar scoliosis  Postoperative diagnosis: Same  Procedure: #1 decompressive laminotomy foraminotomies complete facetectomy on the right at L4-5 with foraminotomies of the L4 and L5 nerve root on the right  #2 nonsegmental pedicle screw fixation L3-S1 utilizing the globus creole modular pedicle screw setwith placement of new screws at L3 bilaterally and L4 on the left  #3 exploration of fusion removal of hardware L5-S1 with removal of the rods at L5-S1 with retention of both L5 and S1 screws  #4 posterior lateral arthrodesis L3-L5 utilizing locally harvested autograft mixed with BMP and vivigen  Surgeon: Dominica Severin Dean Goldner  Asst.: Dayton Bailiff  Anesthesia: Gen.  EBL: Minimal  History of present illness:77 year old gentleman presented undergone a previous L5-S1 fusion and did very well however had a fall sustained an L4 burst fracture failed conservative treatment with bracing as well as vertebroplasty progress worsening back pain and right leg radiculopathy. Workup revealed stenosis at L4-5 and collapse of the L4 vertebra. So due to patient progressive clinical syndrome imaging findings and failure conservative treatment I recommended laminectomy laminotomy foraminotomies of the L4 and L5 nerve root on the right with pedicle screw fixation from L3-L5 across the fracture. I extensively reviewed the risks and benefits of the operation the patient as well as perioperative course expectations of outcome alternatives of surgery and he understood and agreed to proceed forward.  Operative procedure: Patient brought into the or was induced under general anesthesia positioned prone the Wilson frame his back was prepped and draped in routine sterile fashion. Is old incision was opened up and extended cephalad subperiosteal dissections care lamina of L3 and L4 exposing the TPs at L3-L4 and then exposed the hardware from L5-S1.  Disconnect the hardware the fusion 51 appear to be solid expose the TPs at L5 the TPs and L4 bilaterally and L3 bilaterally then identified the lamina complex at L4 on the right drilled down with a high-speed drill drilled down the medial facet performed a complete facetectomy and laminotomy identified the thecal sac tract out the L4 foramen as well as the L5 foramen there was marked spondylosis with severe hourglass compression of thecal sac at this level. I removed the hypertrophied ligament under bit the medial gutter and decompress the 4 and 5 foramen. After all the decompression been done utilizing fluoroscopy for high-speed drill pilot holes were drilled at L3 bilaterally and L4 and the left pedicles were cannulated probed tapped 55 Probed again and 6 5 x 45 screws inserted L4 and the left L3 bilaterally. All screws excellent purchase and fluoroscopy confirmed good position. In a fashion 2 rods connecting up the L3-4-5 and S1 screws. Prior to rod placement I aggressively decorticated the TPs lateral facet complexes at L3, L4, and L5. Laid the locally harvested autograft mixed with BMP and vivigen posterior laterally then connected up the rods anchored in the top tightening nuts I decompress L4-5 and L3-4 on the left to reduce the scoliotic deformity. Then the wound scope was irrigated meticulous in space was maintained X Breaux was injected in the fascia vancomycin powder was struggled of the wound the foraminal reinspected to confirm patency no migration of graft material. Then placed a medium large Hemovac drain and closed in layers with interrupted Vicryl and a running 4 subcuticular Dermabond benzo and Steri-Strips and a sterile dressing was applied patient recovered in stable condition. At the end the case all needle counts sponge counts were correct.

## 2017-06-09 LAB — BASIC METABOLIC PANEL
Anion gap: 11 (ref 5–15)
BUN: 26 mg/dL — ABNORMAL HIGH (ref 6–20)
CO2: 25 mmol/L (ref 22–32)
Calcium: 8.7 mg/dL — ABNORMAL LOW (ref 8.9–10.3)
Chloride: 101 mmol/L (ref 101–111)
Creatinine, Ser: 1.28 mg/dL — ABNORMAL HIGH (ref 0.61–1.24)
GFR calc Af Amer: >60 mL/min (ref >60)
GFR calc non Af Amer: 52 mL/min — ABNORMAL LOW (ref >60)
Glucose, Bld: 176 mg/dL — ABNORMAL HIGH (ref 65–99)
Potassium: 3.9 mmol/L (ref 3.5–5.1)
Sodium: 137 mmol/L (ref 135–145)

## 2017-06-09 MED FILL — Thrombin For Soln Kit 20000 Unit: CUTANEOUS | Qty: 1 | Status: AC

## 2017-06-09 NOTE — Progress Notes (Signed)
Occupational Therapy Evaluation  PTA, pt was modified independent with ADL and mobility @ cane level. Pt currently requires  Min A with LB ADL and S with mobility using RW. Will follow acutely to increase independence with ADL using compensatory techniques, DME and AE as needed. Will follow acutely to facilitate a safe DC home.    06/09/17 1200  OT Visit Information  Last OT Received On 06/09/17  Assistance Needed +1  History of Present Illness 77 y.o. male s/p L3-S1 spinal surgery 1/16 after fall and L4 burst fracture.  PMH includes:  L4-L5 Fusion, Valvular heart disease, RA, Neuropathy, GERD, CAD, Arrhythmia, L TKA, Lumbar fusion. Aortic valve replacement.   Precautions  Precautions Back  Precaution Comments reviewed 3/3 precautions with patient.   Restrictions  Weight Bearing Restrictions No  Home Living  Family/patient expects to be discharged to: Private residence  Living Arrangements Spouse/significant other  Available Help at Discharge Family;Available 24 hours/day  Type of Home Apartment  Home Access Level entry  Home Layout One level  Bathroom Shower/Tub Walk-in shower (sit down shower)  Bathroom Toilet Handicapped height  Bathroom Accessibility Yes  How Accessible Accessible via walker  Home Equipment Grab bars - tub/shower;Shower seat;BSC;Walker - 2 wheels;Cane - single point;Adaptive equipment  Adaptive Equipment Reacher;Sock aid;Long-handled shoe horn;Long-handled sponge  Prior Function  Level of Independence Independent with assistive device(s)  Comments pt ambulates with use of SPC,sometimes RW, and independent with ADLs  Communication  Communication No difficulties  Pain Assessment  Pain Assessment 0-10  Pain Score 3  Pain Location low back   Pain Descriptors / Indicators Discomfort  Pain Intervention(s) Limited activity within patient's tolerance  Cognition  Arousal/Alertness Awake/alert  Behavior During Therapy WFL for tasks assessed/performed  Overall  Cognitive Status Within Functional Limits for tasks assessed  Upper Extremity Assessment  Upper Extremity Assessment Overall WFL for tasks assessed  Lower Extremity Assessment  Lower Extremity Assessment Defer to PT evaluation  Cervical / Trunk Assessment  Cervical / Trunk Assessment Other exceptions;Kyphotic (post op pain and guarding posture)  ADL  Overall ADL's  Needs assistance/impaired  Grooming Set up;Supervision/safety  Upper Body Bathing Supervision/ safety;Set up;Sitting  Lower Body Bathing Minimal assistance;Sit to/from stand  Upper Body Dressing  Set up;Sitting  Lower Body Dressing Minimal assistance;Sit to/from Retail buyer Supervision/safety;RW;BSC;Ambulation  Toileting- Water quality scientist and Hygiene Set up;Sit to/from stand  Functional mobility during ADLs Supervision/safety;Rolling walker;Cueing for safety  General ADL Comments plans to use his wife's AE; Would benefit from elastic laces  Bed Mobility  Overal bed mobility Needs Assistance  Bed Mobility Sit to Sidelying  Sit to sidelying Supervision  General bed mobility comments vc for proper technique  Transfers  Overall transfer level Needs assistance  Equipment used Rolling walker (2 wheeled)  Transfers Sit to/from Stand  Sit to Stand Supervision  Balance  Overall balance assessment Needs assistance  Sitting-balance support Feet unsupported;Bilateral upper extremity supported  Sitting balance-Leahy Scale Good  Standing balance support During functional activity;Bilateral upper extremity supported  Standing balance-Leahy Scale Fair  Standing balance comment Able to release RW for pericare  OT - End of Session  Equipment Utilized During Treatment Rolling walker  Activity Tolerance Patient tolerated treatment well  Patient left in bed;with call bell/phone within reach;with bed alarm set  Nurse Communication Mobility status  OT Assessment  OT Recommendation/Assessment Patient needs continued OT  Services  OT Visit Diagnosis Other abnormalities of gait and mobility (R26.89);Pain  Pain - part of body (back)  OT  Problem List Decreased range of motion;Decreased knowledge of use of DME or AE;Decreased knowledge of precautions;Pain  OT Plan  OT Frequency (ACUTE ONLY) Min 2X/week  OT Treatment/Interventions (ACUTE ONLY) Self-care/ADL training;DME and/or AE instruction;Therapeutic activities;Patient/family education  AM-PAC OT "6 Clicks" Daily Activity Outcome Measure  Help from another person eating meals? 4  Help from another person taking care of personal grooming? 3  Help from another person toileting, which includes using toliet, bedpan, or urinal? 3  Help from another person bathing (including washing, rinsing, drying)? 3  Help from another person to put on and taking off regular upper body clothing? 4  Help from another person to put on and taking off regular lower body clothing? 3  6 Click Score 20  ADL G Code Conversion CJ  OT Recommendation  Follow Up Recommendations No OT follow up;Supervision - Intermittent  OT Equipment None recommended by OT  Individuals Consulted  Consulted and Agree with Results and Recommendations Patient  Acute Rehab OT Goals  Patient Stated Goal return home  OT Goal Formulation With patient  Time For Goal Achievement 06/23/17  Potential to Achieve Goals Good  OT Time Calculation  OT Start Time (ACUTE ONLY) 1210  OT Stop Time (ACUTE ONLY) 1233  OT Time Calculation (min) 23 min  OT General Charges  $OT Visit 1 Visit  OT Evaluation  $OT Eval Low Complexity 1 Low  OT Treatments  $Self Care/Home Management  8-22 mins  Written Expression  Dominant Hand Right  [facility], OT/L  [phone] 06/09/2017

## 2017-06-09 NOTE — Evaluation (Signed)
Physical Therapy Evaluation Patient Details Name: Bryan [name] MRN: [id number] DOB: [birth year] Today's Date: 06/09/2017   History of Present Illness  77 y.o. male s/p L3-S1 spinal surgery 1/16 after fall and L4 burst fracture.  PMH includes:  L4-L5 Fusion, Valvular heart disease, RA, Neuropathy, GERD, CAD, Arrhythmia, L TKA, Lumbar fusion. Aortic valve replacement.     Clinical Impression  Patient is s/p above surgery resulting in functional limitations due to the deficits listed below (see PT Problem List). PTA, patient was mod I with all mobility and ADL's utilizing SPC and RW with ambulation. Patient lives with wife who is avail for 24/7 support, in 1 story condo with level entry.  Upon evaluation, patient presents with mild post op pain, BLE weakness, and balance deficits that limit his mobility. Currently min guard level with transfers and progressed to close supervision ambulating hallway this session with RW. Recommending  HHPT to maximize safety and independence once medcially cleared for d/c, patient agreeable. Patient will benefit from skilled PT to increase their independence and safety with mobility to allow discharge to the venue listed below.       Follow Up Recommendations Home health PT;Supervision for mobility/OOB    Equipment Recommendations  None recommended by PT    Recommendations for Other Services       Precautions / Restrictions Precautions Precautions: Back Precaution Comments: reviewed 3/3 precautions with patient.  Restrictions Weight Bearing Restrictions: No      Mobility  Bed Mobility Overal bed mobility: Modified Independent                Transfers Overall transfer level: Needs assistance Equipment used: Rolling walker (2 wheeled) Transfers: Sit to/from Stand Sit to Stand: Min assist;Min guard         General transfer comment: Min A to help power up into RW, min guard to stabilize during transfer. Cues for hand placement.    Ambulation/Gait Ambulation/Gait assistance: Min guard;Supervision Ambulation Distance (Feet): 200 Feet Assistive device: Rolling walker (2 wheeled) Gait Pattern/deviations: Step-through pattern;Step-to pattern;Narrow base of support Gait velocity: decreased   General Gait Details: Patient ambulates with narrow base of support with good stability inside RW. decreased velocity. cues for base of support   Stairs            Wheelchair Mobility    Modified Rankin (Stroke Patients Only)       Balance Overall balance assessment: Needs assistance Sitting-balance support: Feet unsupported;Bilateral upper extremity supported Sitting balance-Leahy Scale: Good     Standing balance support: During functional activity;Bilateral upper extremity supported Standing balance-Leahy Scale: Poor Standing balance comment: BUE support with dynamic balance                             Pertinent Vitals/Pain Pain Assessment: 0-10 Pain Score: 2  Pain Location: low back  Pain Descriptors / Indicators: Discomfort Pain Intervention(s): Limited activity within patient's tolerance;Monitored during session    Home Living Family/patient expects to be discharged to:: Private residence Living Arrangements: Spouse/significant other Available Help at Discharge: Family;Available 24 hours/day Type of Home: Apartment Home Access: Level entry     Home Layout: One level Home Equipment: Grab bars - tub/shower;Shower seat;Bedside commode;Walker - 2 wheels;Cane - single point      Prior Function Level of Independence: Independent with assistive device(s)         Comments: pt ambulates with use of SPC,sometimes RW, and independent with ADLs  Hand Dominance   Dominant Hand: Right    Extremity/Trunk Assessment   Upper Extremity Assessment Upper Extremity Assessment: Defer to OT evaluation;Overall WFL for tasks assessed    Lower Extremity Assessment Lower Extremity Assessment:  (Gross Strength: RLE 3+/5   LLE 4-/5 )    Cervical / Trunk Assessment Cervical / Trunk Assessment: Other exceptions(post op pain and guarding posture)  Communication   Communication: No difficulties  Cognition Arousal/Alertness: Awake/alert Behavior During Therapy: WFL for tasks assessed/performed Overall Cognitive Status: Within Functional Limits for tasks assessed                                        General Comments      Exercises     Assessment/Plan    PT Assessment Patient needs continued PT services  PT Problem List Decreased strength;Decreased activity tolerance;Decreased range of motion;Decreased balance;Decreased mobility;Pain       PT Treatment Interventions DME instruction;Gait training;Stair training;Functional mobility training;Therapeutic activities;Therapeutic exercise    PT Goals (Current goals can be found in the Care Plan section)  Acute Rehab PT Goals Patient Stated Goal: return home PT Goal Formulation: With patient Time For Goal Achievement: 06/13/17 Potential to Achieve Goals: Good    Frequency Min 5X/week   Barriers to discharge        Co-evaluation               AM-PAC PT "6 Clicks" Daily Activity  Outcome Measure Difficulty turning over in bed (including adjusting bedclothes, sheets and blankets)?: A Little Difficulty moving from lying on back to sitting on the side of the bed? : A Little Difficulty sitting down on and standing up from a chair with arms (e.g., wheelchair, bedside commode, etc,.)?: A Little Help needed moving to and from a bed to chair (including a wheelchair)?: A Little Help needed walking in hospital room?: A Little Help needed climbing 3-5 steps with a railing? : A Lot 6 Click Score: 17    End of Session Equipment Utilized During Treatment: Gait belt Activity Tolerance: Patient tolerated treatment well Patient left: in chair;with call bell/phone within reach;with nursing/sitter in room Nurse  Communication: Mobility status PT Visit Diagnosis: Unsteadiness on feet (R26.81);Other abnormalities of gait and mobility (R26.89);History of falling (Z91.81);Muscle weakness (generalized) (M62.81);Pain Pain - part of body: (lumbar)    Time: 7106-2694 PT Time Calculation (min) (ACUTE ONLY): 33 min   Charges:   PT Evaluation $PT Eval Low Complexity: 1 Low PT Treatments $Gait Training: 8-22 mins   PT G Codes:       Reinaldo Berber, PT, DPT Acute Rehab Services Pager: [phone]    Reinaldo Berber 06/09/2017, 11:46 AM

## 2017-06-09 NOTE — Progress Notes (Signed)
Subjective: Patient reports moderate back pain. Denies any pain or NTW down his legs.   Objective: Vital signs in last 24 hours: Temp:  [97 F (36.1 C)-98.3 F (36.8 C)] 97.7 F (36.5 C) (01/17 0533) Pulse Rate:  [53-79] 79 (01/17 0533) Resp:  [7-19] 16 (01/17 0533) BP: (106-169)/(48-74) 111/60 (01/17 0533) SpO2:  [96 %-100 %] 98 % (01/17 0533) Weight:  [80.7 kg (178 lb)] 80.7 kg (178 lb) (01/16 0929)  Intake/Output from previous day: 01/16 0701 - 01/17 0700 In: 1100 [I.V.:1100] Out: 2595 [Urine:1960; Drains:435; Blood:200] Intake/Output this shift: No intake/output data recorded.  Neurologic: Grossly normal  Lab Results: Lab Results  Component Value Date   WBC 3.1 (L) 06/06/2017   HGB 10.3 (L) 06/06/2017   HCT 32.1 (L) 06/06/2017   MCV 94.1 06/06/2017   PLT 123 (L) 06/06/2017   Lab Results  Component Value Date   INR 1.01 01/26/2017   BMET Lab Results  Component Value Date   NA 136 06/06/2017   K 3.8 06/06/2017   CL 102 06/06/2017   CO2 24 06/06/2017   GLUCOSE 122 (H) 06/06/2017   BUN 19 06/06/2017   CREATININE 1.03 06/06/2017   CALCIUM 9.0 06/06/2017    Studies/Results: Dg Lumbar Spine 2-3 Views  Result Date: 06/08/2017 CLINICAL DATA:  PLIF. EXAM: LUMBAR SPINE - 2-3 VIEW COMPARISON:  CT lumbar spine dated March 09, 2017. FINDINGS: New bilateral L3 and left L4 pedicle screws. Prior L5-S1 posterior and interbody fusion. Prior L4 vertebroplasty with unchanged extruded cement within the L4-L5 disc space. Normal alignment. IMPRESSION: New pedicle screws in the L3 and L4 vertebral bodies. FLUOROSCOPY TIME:  23 seconds. C-arm fluoroscopic images were obtained intraoperatively and submitted for post operative interpretation. Electronically Signed   By: [name] M.D.   On: 06/08/2017 14:16   Dg C-arm 1-60 Min  Result Date: 06/08/2017 CLINICAL DATA:  PLIF EXAM: DG C-ARM 61-120 MIN COMPARISON:  03/09/2017 CT lumbar spine FINDINGS: Two low resolution  intraoperative spot views of the lumbar spine. Total fluoroscopy time was 23 seconds. Surgical retractors and fixating screws are visible in the lumbar spine. Prior vertebral augmentation at L4. Interbody device at L5-S1. IMPRESSION: Intraoperative fluoroscopic assistance provided during lumbar spine surgery. Electronically Signed   By: Donavan Foil M.D.   On: 06/08/2017 14:16    Assessment/Plan: Doing well post op day 1 of his decompression and pedicle screw fixation L3-S1. Has not been up to ambulate yet. Continue pain management.    LOS: 1 day    Bryan [name] 06/09/2017, 7:25 AM

## 2017-06-09 NOTE — Progress Notes (Addendum)
Initial Nutrition Assessment  DOCUMENTATION CODES:   Not applicable  INTERVENTION:  Ensure Enlive po BID, each supplement provides 350 kcal and 20 grams of protein   NUTRITION DIAGNOSIS:   Increased nutrient needs related to acute illness as evidenced by estimated needs  GOAL:   Patient will meet greater than or equal to 90% of their needs  MONITOR:   PO intake, Supplement acceptance, I & O's, Labs, Weight trends  REASON FOR ASSESSMENT:   Malnutrition Screening Tool    ASSESSMENT:   77 year old presents for a decompressive laminotomy at L4-5 on the right and pedicle screw fixation L3-S1.  s/p decompression and pedicle screw fixation L3-S1  Spoke with Bryan [name] at bedside. He reports losing weight from 205 pounds to 178 pounds over the course of 1.5 years. States he has gained a few pounds over the past few months. Per care everywhere, he was 193 pounds 08/23/2016, 209 pounds 12/2015. Still weight, loss is insignificant for time frame. Normally eats bacon or sausage with eggs, pancakes, and 1/2 grapefruit for "brunch." Will eat a small ham sandwich for a late lunch. Eats 2 vegetables, a meat, and bread for dinner normally, a meal he cooks. Seems he has been eating well. Had a good meal this morning for breakfast but eggs came up cold. Apologized for cafeteria renovations and the food coming up cold. Was drinking an ensure during visit as well.  Labs reviewed Medications reviewed and include:  Folic Acid LR 18-29HB/ZJ   Intake/Output Summary (Last 24 hours) at 06/09/2017 1253 Last data filed at 06/09/2017 6967 Gross per 24 hour  Intake 1420 ml  Output 2625 ml  Net -1205 ml       NUTRITION - FOCUSED PHYSICAL EXAM:    Most Recent Value  Orbital Region  No depletion  Upper Arm Region  No depletion  Thoracic and Lumbar Region  No depletion  Buccal Region  Moderate depletion  Temple Region  Moderate depletion  Clavicle Bone Region  No depletion  Clavicle and  Acromion Bone Region  No depletion  Scapular Bone Region  No depletion  Dorsal Hand  No depletion  Patellar Region  No depletion  Anterior Thigh Region  No depletion  Posterior Calf Region  No depletion  Edema (RD Assessment)  None  Hair  Reviewed  Eyes  Reviewed  Mouth  Reviewed  Skin  Reviewed  Nails  Reviewed       Diet Order:  Diet regular Room service appropriate? Yes; Fluid consistency: Thin  EDUCATION NEEDS:   Not appropriate for education at this time  Skin:  Skin Assessment: Skin Integrity Issues: Skin Integrity Issues:: Incisions Incisions: Closed to back  Last BM:  06/07/2017  Height:   Ht Readings from Last 1 Encounters:  06/08/17 6' (1.829 m)    Weight:   Wt Readings from Last 1 Encounters:  06/08/17 178 lb (80.7 kg)    Ideal Body Weight:  80.9 kg  BMI:  Body mass index is 24.14 kg/m.  Estimated Nutritional Needs:   Kcal:  8938-1017 calories (MSJ x1.3-1.5)  Protein:  105-122 grams (1.3-1.5g/kg)  Fluid:  >2L   Bryan Anis. Bryan Stankovich, MS, RD LDN Inpatient Clinical Dietitian Pager [phone]

## 2017-06-09 NOTE — Consult Note (Signed)
[facility] CM Primary Care Navigator  06/09/2017  Bryan [name] 1940/09/28 [id number]   Met withpatient at the bedsideto identify possible discharge needs. He reports having "worsening pain to back and right leg" thathad led to this admission/ surgery.  Patientendorses Dr.Eric Caryl Bis with [facility]rap[facility] at [employer] care provider.   [facility] [place] so far.   Patient reportsthat he [facility] of "pill box" system filled once a week.  Patient verbalized that he was driving prior to admission/ surgery but wife Tamela Oddi) or friend Doren Custard) can provide transportation to hisdoctors'appointments after discharge.  He mentionedthat wife will be his primary caregiver at home.  Anticipateddischarge planishomewith home health services per therapy recommendation.  Patientvoiced understanding to call primary care provider's officewhen hereturnshomefor a post discharge follow-up within1-2weeksor sooner if needs arise.Patient letter (with PCP's contact number) was provided as areminder.   Discussed with patientregarding [facility] CM services available for health management at home but he denies any current needs or concerns at this time. [place] calls to follow-uprecovery as well, stating that him and wife are capable of monitoring his condition after discharge.  Patientwas encouraged and he expressed understanding to seekreferral from primary care provider to [facility] care management if deemednecessaryand appropriate for services in the future.  [facility] care management information was provided for future needs that he may have.   For additional questions please contact:  Edwena Felty A. Franceska Strahm, BSN, RN-BC [facility] PRIMARY CARE Navigator Cell: [phone]

## 2017-06-10 LAB — TYPE AND SCREEN
ABO/RH(D): O POS
Antibody Screen: NEGATIVE
Unit division: 0
Unit division: 0

## 2017-06-10 LAB — BPAM RBC
Blood Product Expiration Date: 201902092359
Blood Product Expiration Date: 201902122359
ISSUE DATE / TIME: 201901161214
ISSUE DATE / TIME: 201901161214
Unit Type and Rh: 5100
Unit Type and Rh: 5100

## 2017-06-10 MED ORDER — HYDROCODONE-ACETAMINOPHEN 10-325 MG PO TABS: 1.0000 | ORAL_TABLET | ORAL | 0 refills | Status: DC | PRN

## 2017-06-10 MED ORDER — CYCLOBENZAPRINE HCL 10 MG PO TABS: 10.0000 mg | ORAL_TABLET | Freq: Three times a day (TID) | ORAL | 0 refills | Status: DC | PRN

## 2017-06-10 NOTE — Progress Notes (Signed)
Physical Therapy Treatment Patient Details Name: Bryan [name] MRN: [id number] DOB: [birth year] Today's Date: 06/10/2017    History of Present Illness 77 y.o. male s/p L3-S1 spinal surgery 1/16 after fall and L4 burst fracture.  PMH includes:  L4-L5 Fusion, Valvular heart disease, RA, Neuropathy, GERD, CAD, Arrhythmia, L TKA, Lumbar fusion. Aortic valve replacement.     PT Comments    Pt progressing with therapy. Patient demonstrating safe transfers and ambulation in hallway with RW. Pt and family report no further questions or concerns for safe return home and will do well with HHPT to continue to progress activity and improve balance to maximize safety.     Follow Up Recommendations  Home health PT;Supervision for mobility/OOB     Equipment Recommendations  None recommended by PT    Recommendations for Other Services       Precautions / Restrictions Precautions Precautions: Back Precaution Comments: reviewed 3/3 precautions with patient.  Restrictions Weight Bearing Restrictions: No    Mobility  Bed Mobility Overal bed mobility: Needs Assistance Bed Mobility: Sit to Sidelying         Sit to sidelying: Supervision General bed mobility comments: vc for proper technique, cued for log roll   Transfers Overall transfer level: Needs assistance Equipment used: Rolling walker (2 wheeled) Transfers: Sit to/from Stand Sit to Stand: Supervision         General transfer comment: patient able to stand without physical assistance today. good hand placement and stability in RW  Ambulation/Gait Ambulation/Gait assistance: Min guard;Supervision Ambulation Distance (Feet): 200 Feet Assistive device: Rolling walker (2 wheeled) Gait Pattern/deviations: Step-through pattern;Step-to pattern;Narrow base of support Gait velocity: decreased   General Gait Details: pt with improved posture from last visit, cues for base of support, progressed to supervision this session with  safe balance inside RW   Stairs            Wheelchair Mobility    Modified Rankin (Stroke Patients Only)       Balance Overall balance assessment: Needs assistance Sitting-balance support: Feet unsupported;Bilateral upper extremity supported Sitting balance-Leahy Scale: Good     Standing balance support: During functional activity;Bilateral upper extremity supported Standing balance-Leahy Scale: Fair                              Cognition Arousal/Alertness: Awake/alert Behavior During Therapy: WFL for tasks assessed/performed Overall Cognitive Status: Within Functional Limits for tasks assessed                                        Exercises      General Comments        Pertinent Vitals/Pain Pain Assessment: Faces Faces Pain Scale: Hurts a little bit Pain Location: low back  Pain Descriptors / Indicators: Discomfort Pain Intervention(s): Limited activity within patient's tolerance;Monitored during session    Home Living                      Prior Function            PT Goals (current goals can now be found in the care plan section) Acute Rehab PT Goals Patient Stated Goal: return home PT Goal Formulation: With patient Time For Goal Achievement: 06/13/17 Potential to Achieve Goals: Good Progress towards PT goals: Progressing toward goals    Frequency    Min  5X/week      PT Plan Current plan remains appropriate    Co-evaluation              AM-PAC PT "6 Clicks" Daily Activity  Outcome Measure  Difficulty turning over in bed (including adjusting bedclothes, sheets and blankets)?: A Little Difficulty moving from lying on back to sitting on the side of the bed? : A Little Difficulty sitting down on and standing up from a chair with arms (e.g., wheelchair, bedside commode, etc,.)?: A Little Help needed moving to and from a bed to chair (including a wheelchair)?: A Little Help needed walking in  hospital room?: A Little Help needed climbing 3-5 steps with a railing? : A Little 6 Click Score: 18    End of Session Equipment Utilized During Treatment: Gait belt Activity Tolerance: Patient tolerated treatment well Patient left: with call bell/phone within reach;in bed;with family/visitor present Nurse Communication: Mobility status PT Visit Diagnosis: Unsteadiness on feet (R26.81);Other abnormalities of gait and mobility (R26.89);History of falling (Z91.81);Muscle weakness (generalized) (M62.81);Pain Pain - part of body: (lumbar)     Time: 1202-1222 PT Time Calculation (min) (ACUTE ONLY): 20 min  Charges:  $Gait Training: 8-22 mins                    G Codes:      Reinaldo Berber, PT, DPT Acute Rehab Services Pager: [phone]     Reinaldo Berber 06/10/2017, 12:37 PM

## 2017-06-10 NOTE — Care Management Note (Signed)
Case Management Note  Patient Details  Name: Bryan [name] MRN: [id number] Date of Birth: December 09, 1940  Subjective/Objective:                    Action/Plan: Pt discharged home with [facility] orders. CM met with the patient and provided him choice. He selected [facility]. CM spoke with [facility] and they accepted the referral. Needed information faxed to [facility].  Patients family to provide transportation home.    Expected Discharge Date:  06/10/17               Expected Discharge Plan:  [place]  In-House Referral:     Discharge planning Services  CM Consult  Post Acute Care Choice:  Home Health Choice offered to:  Patient, Spouse  DME Arranged:    DME Agency:     HH Arranged:  PT [place]:  [place]  Status of Service:  Completed, signed off  If discussed at [place] of Stay Meetings, dates discussed:    Additional Comments:  Pollie Friar, RN 06/10/2017, 3:55 PM

## 2017-06-10 NOTE — Discharge Summary (Signed)
Physician Discharge Summary  Patient ID: Bryan [name] MRN: [id number] DOB/AGE: 01-Mar-1941 77 y.o.  Admit date: 06/08/2017 Discharge date: 06/10/2017  Admission Diagnoses: L4 burst fracture right L5 radiculopathywith degenerative lumbar scoliosis  Discharge Diagnoses: same as admitting   Discharged Condition: good  Hospital Course: The patient was admitted on 06/08/2017 and taken to the operating room where the patient underwent decompressive lami right l4-5 with foraminotomy, pedicle screw fixaiation L3-S1. The patient tolerated the procedure well and was taken to the recovery room and then to the floor in stable condition. The hospital course was routine. There were no complications. The wound remained clean dry and intact. Pt had appropriate back soreness. No complaints of new pain or new N/T/W. The patient remained afebrile with stable vital signs, and tolerated a regular diet. The patient continued to increase activities, and pain was well controlled with oral pain medications.   Consults: None  Significant Diagnostic Studies:  Results for orders placed or performed during the hospital encounter of 70/96/28  Basic metabolic panel  Result Value Ref Range   Sodium 137 135 - 145 mmol/L   Potassium 3.9 3.5 - 5.1 mmol/L   Chloride 101 101 - 111 mmol/L   CO2 25 22 - 32 mmol/L   Glucose, Bld 176 (H) 65 - 99 mg/dL   BUN 26 (H) 6 - 20 mg/dL   Creatinine, Ser 1.28 (H) 0.61 - 1.24 mg/dL   Calcium 8.7 (L) 8.9 - 10.3 mg/dL   GFR calc non Af Amer 52 (L) >60 mL/min   GFR calc Af Amer >60 >60 mL/min   Anion gap 11 5 - 15  Type and screen [place]  Result Value Ref Range   ABO/RH(D) O POS    Antibody Screen NEG    Sample Expiration 06/11/2017    Unit Number [id number]    Blood Component Type RBC, LR IRR    Unit division 00    Status of Unit REL FROM [facility]    Transfusion Status OK TO TRANSFUSE    Crossmatch Result Compatible    Unit Number [id number]     Blood Component Type RBC, LR IRR    Unit division 00    Status of Unit REL FROM [facility]    Transfusion Status OK TO TRANSFUSE    Crossmatch Result Compatible   Prepare RBC  Result Value Ref Range   Order Confirmation ORDER PROCESSED BY BLOOD BANK   BPAM RBC  Result Value Ref Range   ISSUE DATE / TIME [id number]    Blood Product Unit Number [id number]    PRODUCT CODE [id number]    Unit Type and Rh 5100    Blood Product Expiration Date 701779390300    ISSUE DATE / TIME [id number]    Blood Product Unit Number [id number]    PRODUCT CODE [id number]    Unit Type and Rh 5100    Blood Product Expiration Date [id number]     Dg Lumbar Spine 2-3 Views  Result Date: 06/08/2017 CLINICAL DATA:  PLIF. EXAM: LUMBAR SPINE - 2-3 VIEW COMPARISON:  CT lumbar spine dated March 09, 2017. FINDINGS: New bilateral L3 and left L4 pedicle screws. Prior L5-S1 posterior and interbody fusion. Prior L4 vertebroplasty with unchanged extruded cement within the L4-L5 disc space. Normal alignment. IMPRESSION: New pedicle screws in the L3 and L4 vertebral bodies. FLUOROSCOPY TIME:  23 seconds. C-arm fluoroscopic images were obtained intraoperatively and submitted for post operative interpretation. Electronically Signed   By: Orville Govern.D.  On: 06/08/2017 14:16   Dg C-arm 1-60 Min  Result Date: 06/08/2017 CLINICAL DATA:  PLIF EXAM: DG C-ARM 61-120 MIN COMPARISON:  03/09/2017 CT lumbar spine FINDINGS: Two low resolution intraoperative spot views of the lumbar spine. Total fluoroscopy time was 23 seconds. Surgical retractors and fixating screws are visible in the lumbar spine. Prior vertebral augmentation at L4. Interbody device at L5-S1. IMPRESSION: Intraoperative fluoroscopic assistance provided during lumbar spine surgery. Electronically Signed   By: Donavan Foil M.D.   On: 06/08/2017 14:16    Antibiotics:  Anti-infectives (From admission, onward)   Start     Dose/Rate Route Frequency Ordered Stop    06/09/17 0800  vancomycin (VANCOCIN) 1,500 mg in sodium chloride 0.9 % 500 mL IVPB     1,500 mg 250 mL/hr over 120 Minutes Intravenous Every 24 hours 06/08/17 1719     06/08/17 1712  amoxicillin (AMOXIL) capsule 2,000 mg  Status:  Discontinued    Comments:  Take 4 Capsules prior to dental appointment     2,000 mg Oral See admin instructions 06/08/17 1712 06/08/17 1720   06/08/17 1420  vancomycin (VANCOCIN) powder  Status:  Discontinued       As needed 06/08/17 1420 06/08/17 1444   06/08/17 1334  bacitracin 50,000 Units in sodium chloride irrigation 0.9 % 500 mL irrigation  Status:  Discontinued       As needed 06/08/17 1334 06/08/17 1444   06/08/17 1000  vancomycin (VANCOCIN) IVPB 1000 mg/200 mL premix     1,000 mg 200 mL/hr over 60 Minutes Intravenous To [facility] 06/07/17 1508 06/08/17 1245      Discharge Exam: Blood pressure (!) 132/51, pulse 75, temperature 98.4 F (36.9 C), temperature source Oral, resp. rate 18, height 6' (1.829 m), weight 80.7 kg (178 lb), SpO2 95 %. Neurologic: Grossly normal Ambulating and voiding well  Discharge Medications:   Allergies as of 06/10/2017      Reactions   Penicillins Hives, Swelling   SWELLING REACTION UNSPECIFIED PATIENT HAS TAKEN AMOXICILLIN ON MED HX FROM [facility] PATIENT HAS HAD A PCN REACTION WITH IMMEDIATE RASH, FACIAL/TONGUE/THROAT SWELLING, SOB, OR LIGHTHEADEDNESS WITH HYPOTENSION:  #  #  #  YES  #  #  #   Has patient had a PCN reaction causing severe rash involving mucus membranes or skin necrosis: No Has patient had a PCN reaction that required hospitalization No Has patient had a PCN reaction occurring within the last 10 years: No   Demerol [meperidine] Hives, Nausea And Vomiting      Medication List    TAKE these medications   amLODipine 5 MG tablet Commonly known as:  NORVASC TAKE ONE TABLET BY MOUTH DAILY   amoxicillin 500 MG capsule Commonly known as:  AMOXIL Take 2,000 mg by mouth See admin instructions. Take 4  Capsules prior to dental appointment   aspirin 325 MG EC tablet Take 325 mg by mouth daily.   atorvastatin 40 MG tablet Commonly known as:  LIPITOR Take 1 tablet (40 mg total) by mouth daily. What changed:  when to take this   brimonidine 0.2 % ophthalmic solution Commonly known as:  ALPHAGAN Place 1 drop into both eyes every morning.   cetirizine 10 MG tablet Commonly known as:  ZYRTEC Take 10 mg by mouth daily.   cyclobenzaprine 10 MG tablet Commonly known as:  FLEXERIL Take 1 tablet (10 mg total) by mouth 3 (three) times daily as needed for muscle spasms.   donepezil 10 MG tablet Commonly known  as:  ARICEPT Take 10 mg by mouth at bedtime.   fenofibrate 160 MG tablet TAKE ONE TABLET BY MOUTH EVERY DAY   fenofibrate 160 MG tablet TAKE ONE TABLET BY MOUTH EVERY DAY   FLUoxetine 20 MG capsule Commonly known as:  PROZAC TAKE 1 CAPSULE BY MOUTH DAILY   folic acid 1 MG tablet Commonly known as:  FOLVITE Take 1 mg by mouth 2 (two) times daily.   gabapentin 400 MG capsule Commonly known as:  NEURONTIN Take 400 mg by mouth 3 (three) times daily.   HYDROcodone-acetaminophen 10-325 MG tablet Commonly known as:  NORCO Take 1 tablet by mouth every 4 (four) hours. What changed:  Another medication with the same name was added. Make sure you understand how and when to take each.   HYDROcodone-acetaminophen 10-325 MG tablet Commonly known as:  NORCO Take 1 tablet by mouth every 4 (four) hours as needed for moderate pain. What changed:  You were already taking a medication with the same name, and this prescription was added. Make sure you understand how and when to take each.   hydrocortisone cream 1 % Apply 1 application topically daily as needed for itching.   lisinopril 20 MG tablet Commonly known as:  PRINIVIL,ZESTRIL TAKE ONE TABLET BY MOUTH EVERY EVENING   mirabegron ER 50 MG Tb24 tablet Commonly known as:  MYRBETRIQ Take 1 tablet (50 mg total) by mouth daily.    omeprazole 40 MG capsule Commonly known as:  PRILOSEC TAKE 1 CAPSULE BY MOUTH DAILY   oxymetazoline 0.05 % nasal spray Commonly known as:  AFRIN Place 1 spray into both nostrils daily as needed for congestion.   polyethylene glycol powder powder Commonly known as:  GLYCOLAX/MIRALAX USE 1 CAPFUL IN 4 TO 8 OUNCES OF LIQUID DAILY   predniSONE 5 MG tablet Commonly known as:  DELTASONE Take 5 mg by mouth daily as needed. Reported on 11/17/2015   tamsulosin 0.4 MG Caps capsule Commonly known as:  FLOMAX TAKE 1 CAPSULE BY MOUTH DAILY   traZODone 50 MG tablet Commonly known as:  DESYREL Take 50-100 mg by mouth at bedtime. 1-2 tablets depending on insomnia       Disposition: home   Final Dx: same  Discharge Instructions    Call MD for:  difficulty breathing, headache or visual disturbances   Complete by:  As directed    Call MD for:  extreme fatigue   Complete by:  As directed    Call MD for:  hives   Complete by:  As directed    Call MD for:  persistant dizziness or light-headedness   Complete by:  As directed    Call MD for:  persistant nausea and vomiting   Complete by:  As directed    Call MD for:  redness, tenderness, or signs of infection (pain, swelling, redness, odor or green/yellow discharge around incision site)   Complete by:  As directed    Call MD for:  severe uncontrolled pain   Complete by:  As directed    Call MD for:  temperature >100.4   Complete by:  As directed    Diet - low sodium heart healthy   Complete by:  As directed    Driving Restrictions   Complete by:  As directed    No driving 2 weeks   Increase activity slowly   Complete by:  As directed    Lifting restrictions   Complete by:  As directed    No lifting more  than 8 lbs   Remove dressing in 24 hours   Complete by:  As directed          Signed: Ocie Cornfield Dorn Hartshorne 06/10/2017, 10:18 AM

## 2017-06-10 NOTE — Progress Notes (Signed)
Occupational Therapy Treatment Patient Details Name: Bryan [name] MRN: [id number] DOB: February 03, 1941 Today's Date: 06/10/2017    History of present illness 77 y.o. male s/p L3-S1 spinal surgery 1/16 after fall and L4 burst fracture.  PMH includes:  L4-L5 Fusion, Valvular heart disease, RA, Neuropathy, GERD, CAD, Arrhythmia, L TKA, Lumbar fusion. Aortic valve replacement.    OT comments  Pt making good progress with functional goals. OT will continue to follow acutely  Follow Up Recommendations  No OT follow up;Supervision - Intermittent    Equipment Recommendations  None recommended by OT    Recommendations for Other Services      Precautions / Restrictions Precautions Precautions: Back Precaution Comments: reviewed 3/3 precautions with patient and family Restrictions Weight Bearing Restrictions: No       Mobility Bed Mobility Overal bed mobility: Needs Assistance Bed Mobility: Sit to Sidelying;Sit to Supine       Sit to supine: Supervision Sit to sidelying: Supervision General bed mobility comments: vc for proper technique, cued for log roll   Transfers Overall transfer level: Needs assistance Equipment used: Rolling walker (2 wheeled) Transfers: Sit to/from Stand Sit to Stand: Supervision         General transfer comment: educated pt and family on safe/proper technique for ADL mobility and sit - stand, stand - sit transitions    Balance Overall balance assessment: Needs assistance Sitting-balance support: Feet unsupported;Bilateral upper extremity supported Sitting balance-Leahy Scale: Good     Standing balance support: During functional activity;Bilateral upper extremity supported Standing balance-Leahy Scale: Fair                             ADL either performed or assessed with clinical judgement   ADL Overall ADL's : Needs assistance/impaired     Grooming: Wash/dry hands;Wash/dry face;Min guard;Standing       Lower Body  Bathing: Minimal assistance;Sit to/from stand;With caregiver independent assisting       Lower Body Dressing: Minimal assistance;Sit to/from stand;With caregiver independent assisting   Toilet Transfer: Supervision/safety;RW;BSC;Ambulation   Toileting- Water quality scientist and Hygiene: Sit to/from stand;Supervision/safety   Tub/ Shower Transfer: Supervision/safety;Walk-in shower;Ambulation;3 in 1   Functional mobility during ADLs: Supervision/safety;Rolling walker;Cueing for safety General ADL Comments: will use wife's A/E and has ADL kit, able to verbalize how each piece is correctly used. educated pt/family on use of reacher for LB dressing     Vision Patient Visual Report: No change from baseline     Perception     Praxis      Cognition Arousal/Alertness: Awake/alert Behavior During Therapy: WFL for tasks assessed/performed Overall Cognitive Status: Within Functional Limits for tasks assessed                                          Exercises     Shoulder Instructions       General Comments      Pertinent Vitals/ Pain       Pain Assessment: 0-10 Faces Pain Scale: Hurts a little bit Pain Location: low back  Pain Descriptors / Indicators: Discomfort Pain Intervention(s): Monitored during session;RN gave pain meds during session;Repositioned  Home Living  Prior Functioning/Environment              Frequency  Min 2X/week        Progress Toward Goals  OT Goals(current goals can now be found in the care plan section)  Progress towards OT goals: Progressing toward goals  Acute Rehab OT Goals Patient Stated Goal: return home  Plan Discharge plan remains appropriate    Co-evaluation                 AM-PAC PT "6 Clicks" Daily Activity     Outcome Measure   Help from another person eating meals?: None Help from another person taking care of personal grooming?: A  Little Help from another person toileting, which includes using toliet, bedpan, or urinal?: A Little Help from another person bathing (including washing, rinsing, drying)?: A Little Help from another person to put on and taking off regular upper body clothing?: None Help from another person to put on and taking off regular lower body clothing?: A Little 6 Click Score: 20    End of Session Equipment Utilized During Treatment: Rolling walker;Other (comment);Gait belt(3 in 1)  OT Visit Diagnosis: Other abnormalities of gait and mobility (R26.89);Pain Pain - part of body: (back)   Activity Tolerance Patient tolerated treatment well   Patient Left in bed;with call bell/phone within reach;with bed alarm set   Nurse Communication      Functional Assessment Tool Used: AM-PAC 6 Clicks Daily Activity   Time: 1660-6004 OT Time Calculation (min): 27 min  Charges: OT G-codes **NOT FOR INPATIENT CLASS** Functional Assessment Tool Used: AM-PAC 6 Clicks Daily Activity OT General Charges $OT Visit: 1 Visit OT Treatments $Self Care/Home Management : 8-22 mins $Therapeutic Activity: 8-22 mins     Britt Bottom 06/10/2017, 1:08 PM

## 2017-06-11 DIAGNOSIS — I480 Paroxysmal atrial fibrillation: Secondary | ICD-10-CM | POA: Diagnosis not present

## 2017-06-11 DIAGNOSIS — S32041D Stable burst fracture of fourth lumbar vertebra, subsequent encounter for fracture with routine healing: Secondary | ICD-10-CM | POA: Diagnosis not present

## 2017-06-11 DIAGNOSIS — I2510 Atherosclerotic heart disease of native coronary artery without angina pectoris: Secondary | ICD-10-CM | POA: Diagnosis not present

## 2017-06-11 DIAGNOSIS — M5416 Radiculopathy, lumbar region: Secondary | ICD-10-CM | POA: Diagnosis not present

## 2017-06-11 DIAGNOSIS — I10 Essential (primary) hypertension: Secondary | ICD-10-CM | POA: Diagnosis not present

## 2017-06-11 DIAGNOSIS — M48061 Spinal stenosis, lumbar region without neurogenic claudication: Secondary | ICD-10-CM | POA: Diagnosis not present

## 2017-06-13 ENCOUNTER — Other Ambulatory Visit: Payer: Self-pay | Admitting: Family Medicine

## 2017-06-13 DIAGNOSIS — S32041D Stable burst fracture of fourth lumbar vertebra, subsequent encounter for fracture with routine healing: Secondary | ICD-10-CM | POA: Diagnosis not present

## 2017-06-13 DIAGNOSIS — M48061 Spinal stenosis, lumbar region without neurogenic claudication: Secondary | ICD-10-CM | POA: Diagnosis not present

## 2017-06-13 DIAGNOSIS — I10 Essential (primary) hypertension: Secondary | ICD-10-CM | POA: Diagnosis not present

## 2017-06-13 DIAGNOSIS — I480 Paroxysmal atrial fibrillation: Secondary | ICD-10-CM | POA: Diagnosis not present

## 2017-06-13 DIAGNOSIS — I2510 Atherosclerotic heart disease of native coronary artery without angina pectoris: Secondary | ICD-10-CM | POA: Diagnosis not present

## 2017-06-13 DIAGNOSIS — M5416 Radiculopathy, lumbar region: Secondary | ICD-10-CM | POA: Diagnosis not present

## 2017-06-15 DIAGNOSIS — M5416 Radiculopathy, lumbar region: Secondary | ICD-10-CM | POA: Diagnosis not present

## 2017-06-15 DIAGNOSIS — I2510 Atherosclerotic heart disease of native coronary artery without angina pectoris: Secondary | ICD-10-CM | POA: Diagnosis not present

## 2017-06-15 DIAGNOSIS — M48061 Spinal stenosis, lumbar region without neurogenic claudication: Secondary | ICD-10-CM | POA: Diagnosis not present

## 2017-06-15 DIAGNOSIS — S32041D Stable burst fracture of fourth lumbar vertebra, subsequent encounter for fracture with routine healing: Secondary | ICD-10-CM | POA: Diagnosis not present

## 2017-06-15 DIAGNOSIS — I10 Essential (primary) hypertension: Secondary | ICD-10-CM | POA: Diagnosis not present

## 2017-06-15 DIAGNOSIS — I480 Paroxysmal atrial fibrillation: Secondary | ICD-10-CM | POA: Diagnosis not present

## 2017-06-18 NOTE — Progress Notes (Signed)
Cardiology Office Note  Date:  06/20/2017   ID:  Bryan [name], DOB June 06, 1940, MRN [id number]  PCP:  Bryan Haven, MD   Chief Complaint  Patient presents with  . other    6 month follow up. Meds reviewed by the pt. verbally. 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 bicuspid aortic valve noted in 2009   cardiac catheterization showing mild to moderate CAD,  AVR with bioprosthetic valve, ascending aorta grafting in 2010,  hypertension,  hyperlipidemia,  mild chronic renal insufficiency, total knee replacement in 2004,  appendix rupture and gallbladder surgery in 2010,  Remote history of postoperative atrial fibrillation in 2010 He is a retired Software engineer who presents for routine follow-up of his bioprosthetic aortic valve and dilated ascending aorta   Back surgery  5//18 Did rehab x 1 week On pain patches, pain pills  Another back surgery, leg pain, Fx of L4, [place]   Another back surgery more recently Performed for continued leg pain, underwent lumbar fusion  Lab work reviewed with him HCT 32, down from 19 Seen by hemaology, workup negative, started on iron, constipation Was given iron infusion  Mild night sweats , little better, has not resolved Weight loss , down another 5 pounds since 10/2016, has good appetite Big drop in weight 10/2015 to 04/2016, 20 pounds  EKG personally reviewed by myself on todays visit Shows sinus bradycardia rate 66 bpm with second-degree AV block type I  Other past medical history reviewed Echo 11/2015: Left ventricle:  The estimated ejection fraction was in the  range of 50% to 55%.  - Aortic valve: A bioprosthesis was present. There was mild stenosis. Mean gradient (S): 20 mm Hg. Valve area (VTI): 1.24 cm^2. - Aortic root: The aortic root was mild to moderately dilated.44 mm (ED). Ascending aorta mildly dilated - Pulmonary  arteries:moderately increased.PA peak pressure: 53 mm Hg (S).   foot surgery 10/13/2015, postoperative delirium, creatinine 1.9 well above his baseline October 2017 had recent back surgery lumbar spine   Echocardiogram June 2016 discussed with him showing normal LV function, valve gradient consistent with bioprosthetic valve, moderately dilated ascending aorta, stable, 4.4 cm  Echocardiogram July 2015 showing mild to moderate aortic valve stenosis, 4.5 cm ascending aorta. No prior records available  Notes indicate some degree of carotid disease though details are not available.  PMH:   has a past medical history of Abnormal CT scan, Allergy, Anemia, Arrhythmia, Chronic lower back pain, Complication of anesthesia, Coronary artery disease, GERD (gastroesophageal reflux disease), Glaucoma, H/O thoracic aortic aneurysm repair, History of being hospitalized, History of blood transfusion, History of chicken pox, Hyperlipidemia, Hypertension, Lumbar stenosis, Neuropathy, Osteoarthritis, Paroxysmal A-fib ([place]), Poor short term memory, Rheumatoid arthritis ([place]), and Valvular heart disease.  PSH:    Past Surgical History:  Procedure Laterality Date  . AORTIC VALVE REPLACEMENT  2007   [facility].  [place], [place]  . APPENDECTOMY  2010  . BACK SURGERY    . CARDIAC VALVE REPLACEMENT    . CATARACT EXTRACTION W/ INTRAOCULAR LENS  IMPLANT, BILATERAL Bilateral   . COLONOSCOPY WITH PROPOFOL N/A 09/24/2016   Procedure: COLONOSCOPY WITH PROPOFOL;  Surgeon: Jonathon Bellows, MD;  Location: [facility] ENDOSCOPY;  Service: Endoscopy;  Laterality: N/A;  . ESOPHAGOGASTRODUODENOSCOPY (EGD) WITH PROPOFOL N/A 09/24/2016   Procedure: ESOPHAGOGASTRODUODENOSCOPY (EGD) WITH PROPOFOL;  Surgeon: Jonathon Bellows, MD;  Location: [facility] ENDOSCOPY;  Service:  Endoscopy;  Laterality: N/A;  . GIVENS CAPSULE STUDY N/A 09/24/2016   Procedure: GIVENS CAPSULE STUDY;  Surgeon: Jonathon Bellows, MD;  Location: [facility] ENDOSCOPY;  Service: Endoscopy;   Laterality: N/A;  . HAMMER TOE SURGERY Right 10/09/2015   Procedure: HAMMER TOE REPAIR WITH K-WIRE FIXATION RIGHT SECOND TOE;  Surgeon: Albertine Patricia, DPM;  Location: [place];  Service: Podiatry;  Laterality: Right;  WITH LOCAL  . JOINT REPLACEMENT Left 2008   knee  . LAPAROSCOPIC CHOLECYSTECTOMY  2010  . LUMBAR FUSION Right 06/08/2017   LUMBAR THREE-FOUR, LUMBAR FOUR-FIVE POSTEROLATERAL ARTHRODESIS WITH RIGHT LUMBAR FOUR-FIVE LAMINECTOMY/FORAMINOTOMY  . LUMBAR LAMINECTOMY/DECOMPRESSION MICRODISCECTOMY Right 03/08/2016   Procedure: Laminectomy and Foraminotomy - Lumbar Five-Sacral One Right;  Surgeon: Kary Kos, MD;  Location: [place];  Service: Neurosurgery;  Laterality: Right;  Right  . MULTIPLE TOOTH EXTRACTIONS     "3"  . POSTERIOR LUMBAR FUSION  10/14/2016   L5-S1  . REPLACEMENT TOTAL KNEE Left 2003  . THORACIC AORTIC ANEURYSM REPAIR  2010    Current Outpatient Medications  Medication Sig Dispense Refill  . amLODipine (NORVASC) 5 MG tablet TAKE ONE TABLET BY MOUTH EVERY DAY 90 tablet 1  . amoxicillin (AMOXIL) 500 MG capsule Take 2,000 mg by mouth See admin instructions. Take 4 Capsules prior to dental appointment    . aspirin 325 MG EC tablet Take 325 mg by mouth daily.    Marland Kitchen atorvastatin (LIPITOR) 40 MG tablet Take 1 tablet (40 mg total) by mouth daily. (Patient taking differently: Take 40 mg by mouth daily at 6 PM. ) 90 tablet 3  . brimonidine (ALPHAGAN) 0.2 % ophthalmic solution Place 1 drop into both eyes every morning.     . cetirizine (ZYRTEC) 10 MG tablet Take 10 mg by mouth daily.    . cyclobenzaprine (FLEXERIL) 10 MG tablet Take 1 tablet (10 mg total) by mouth 3 (three) times daily as needed for muscle spasms. 30 tablet 0  . donepezil (ARICEPT) 10 MG tablet Take 10 mg by mouth at bedtime.    . fenofibrate 160 MG tablet TAKE ONE TABLET BY MOUTH EVERY DAY 30 tablet 0  . FLUoxetine (PROZAC) 20 MG capsule TAKE 1 CAPSULE BY MOUTH DAILY 90 capsule 1  . folic acid  (FOLVITE) 1 MG tablet Take 1 mg by mouth 2 (two) times daily.    Marland Kitchen gabapentin (NEURONTIN) 400 MG capsule Take 400 mg by mouth 3 (three) times daily.     Marland Kitchen HYDROcodone-acetaminophen (NORCO) 10-325 MG tablet Take 1 tablet by mouth every 4 (four) hours as needed for moderate pain. 30 tablet 0  . hydrocortisone cream 1 % Apply 1 application topically daily as needed for itching.    Marland Kitchen lisinopril (PRINIVIL,ZESTRIL) 20 MG tablet TAKE ONE TABLET BY MOUTH EVERY EVENING 90 tablet 3  . omeprazole (PRILOSEC) 40 MG capsule TAKE 1 CAPSULE BY MOUTH DAILY 30 capsule 3  . oxymetazoline (AFRIN) 0.05 % nasal spray Place 1 spray into both nostrils daily as needed for congestion.    . tamsulosin (FLOMAX) 0.4 MG CAPS capsule TAKE 1 CAPSULE BY MOUTH DAILY 90 capsule 0  . traZODone (DESYREL) 50 MG tablet Take 50-100 mg by mouth at bedtime. 1-2 tablets depending on insomnia     No current facility-administered medications for this visit.      Allergies:   Penicillins and Demerol [meperidine]   Social History:  The patient  reports that he quit smoking about 35 years ago. His smoking use included cigarettes. He has  a 32.00 pack-year smoking history. he has never used smokeless tobacco. He reports that he drinks alcohol. He reports that he does not use drugs.   Family History:   family history includes Asthma in his mother; Heart attack (age of onset: 78) in his father; Heart failure in his mother; Hypertension in his mother and sister.    Review of Systems: Review of Systems  Constitutional: Positive for malaise/fatigue.  Respiratory: Negative.   Cardiovascular: Negative.   Gastrointestinal: Negative.   Musculoskeletal: Negative.        Gait instability  Neurological: Negative.   Psychiatric/Behavioral: Positive for memory loss.  All other systems reviewed and are negative.    PHYSICAL EXAM: VS:  BP 140/68 (BP Location: Left Arm, Patient Position: Sitting, Cuff Size: Normal)   Pulse 66   Ht 6' (1.829 m)    Wt 178 lb (80.7 kg)   BMI 24.14 kg/m  , BMI Body mass index is 24.14 kg/m.  Constitutional:  oriented to person, place, and time. No distress.  HENT:  Head: Normocephalic and atraumatic.  Eyes:  no discharge. No scleral icterus.  Neck: Normal range of motion. Neck supple. No JVD present.  Cardiovascular: Normal rate, regular rhythm, normal heart sounds and intact distal pulses. Exam reveals no gallop and no friction rub. No edema No murmur heard. Pulmonary/Chest: Effort normal and breath sounds normal. No stridor. No respiratory distress.  no wheezes.  no rales.  no tenderness.  Abdominal: Soft.  no distension.  no tenderness.  Musculoskeletal: Normal range of motion.  no  tenderness or deformity.  Well-healed lumbar incision Neurological:  normal muscle tone. Coordination normal. No atrophy Skin: Skin is warm and dry. No rash noted. not diaphoretic.  Psychiatric:  normal mood and affect. behavior is normal. Thought content normal.    Recent Labs: 12/16/2016: TSH 0.77 01/26/2017: ALT 15 06/06/2017: Hemoglobin 10.3; Platelets 123 06/09/2017: BUN 26; Creatinine, Ser 1.28; Potassium 3.9; Sodium 137    Lipid Panel Lab Results  Component Value Date   CHOL 112 12/16/2016   HDL 49.40 12/16/2016   LDLCALC 45 12/16/2016   TRIG 90.0 12/16/2016      Wt Readings from Last 3 Encounters:  06/20/17 178 lb (80.7 kg)  06/08/17 178 lb (80.7 kg)  06/06/17 178 lb 6.4 oz (80.9 kg)       ASSESSMENT AND PLAN:   Paroxysmal atrial fibrillation (HCC) - Plan: EKG 12-Lead Maintaining normal sinus rhythm Remote history of postoperative atrial fibrillation in 2010 No changes to his medications, stable , not on anticoagulation  Essential hypertension - Plan: EKG 12-Lead Blood pressure stable on current medication regimen, no changes made  CAD in native artery - Plan: EKG 12-Lead Currently with no symptoms of angina. No further workup at this time. Continue current medication regimen.   Stable  Aneurysm of ascending aorta, does post repair/grafting Visualized on previous echocardiogram, mild to moderately dilated 4.4 cm Consider repeat echocardiogram this year  Mixed hyperlipidemia Cholesterol is at goal on the current lipid regimen. No changes to the medications were made.  Stable  S/P AVR (aortic valve replacement) Aortic valve stable, mild stenosis noted Consider repeat echocardiogram in 2019 to evaluate aorta and valve  Cognitive decline stopped Aricept as he was having bad dreams Followed by neurology Reports symptoms are stable  Bradycardia Second-degree AV block type I No workup needed at this time, asymptomatic   Total encounter time more than 25 minutes  Greater than 50% was spent in counseling and coordination of care  with the patient   Disposition:   F/U  12 months   Orders Placed This Encounter  Procedures  . EKG 12-Lead     Signed, Esmond Plants, M.D., Ph.D. 06/20/2017  [place], [place]

## 2017-06-20 ENCOUNTER — Ambulatory Visit (INDEPENDENT_AMBULATORY_CARE_PROVIDER_SITE_OTHER): Payer: Medicare Other | Admitting: Cardiovascular Disease

## 2017-06-20 ENCOUNTER — Encounter: Payer: Self-pay | Admitting: Cardiovascular Disease

## 2017-06-20 VITALS — BP 140/68 | HR 66 | Ht 72.0 in | Wt 178.0 lb

## 2017-06-20 DIAGNOSIS — R4189 Other symptoms and signs involving cognitive functions and awareness: Secondary | ICD-10-CM | POA: Diagnosis not present

## 2017-06-20 DIAGNOSIS — Z952 Presence of prosthetic heart valve: Secondary | ICD-10-CM | POA: Diagnosis not present

## 2017-06-20 DIAGNOSIS — I209 Angina pectoris, unspecified: Secondary | ICD-10-CM | POA: Diagnosis not present

## 2017-06-20 DIAGNOSIS — I712 Thoracic aortic aneurysm, without rupture: Secondary | ICD-10-CM | POA: Diagnosis not present

## 2017-06-20 DIAGNOSIS — I10 Essential (primary) hypertension: Secondary | ICD-10-CM | POA: Diagnosis not present

## 2017-06-20 DIAGNOSIS — I480 Paroxysmal atrial fibrillation: Secondary | ICD-10-CM

## 2017-06-20 DIAGNOSIS — N179 Acute kidney failure, unspecified: Secondary | ICD-10-CM

## 2017-06-20 DIAGNOSIS — I7121 Aneurysm of the ascending aorta, without rupture: Secondary | ICD-10-CM

## 2017-06-20 DIAGNOSIS — I6523 Occlusion and stenosis of bilateral carotid arteries: Secondary | ICD-10-CM | POA: Diagnosis not present

## 2017-06-20 DIAGNOSIS — I25118 Atherosclerotic heart disease of native coronary artery with other forms of angina pectoris: Secondary | ICD-10-CM

## 2017-06-20 NOTE — Patient Instructions (Signed)

## 2017-06-22 DIAGNOSIS — I2510 Atherosclerotic heart disease of native coronary artery without angina pectoris: Secondary | ICD-10-CM | POA: Diagnosis not present

## 2017-06-22 DIAGNOSIS — S32041D Stable burst fracture of fourth lumbar vertebra, subsequent encounter for fracture with routine healing: Secondary | ICD-10-CM | POA: Diagnosis not present

## 2017-06-22 DIAGNOSIS — M48061 Spinal stenosis, lumbar region without neurogenic claudication: Secondary | ICD-10-CM | POA: Diagnosis not present

## 2017-06-22 DIAGNOSIS — M5416 Radiculopathy, lumbar region: Secondary | ICD-10-CM | POA: Diagnosis not present

## 2017-06-22 DIAGNOSIS — I480 Paroxysmal atrial fibrillation: Secondary | ICD-10-CM | POA: Diagnosis not present

## 2017-06-22 DIAGNOSIS — I10 Essential (primary) hypertension: Secondary | ICD-10-CM | POA: Diagnosis not present

## 2017-06-23 ENCOUNTER — Other Ambulatory Visit: Payer: Self-pay | Admitting: Internal Medicine

## 2017-06-24 ENCOUNTER — Ambulatory Visit (INDEPENDENT_AMBULATORY_CARE_PROVIDER_SITE_OTHER): Payer: Medicare Other | Admitting: Family Medicine

## 2017-06-24 ENCOUNTER — Encounter: Payer: Self-pay | Admitting: Family Medicine

## 2017-06-24 VITALS — BP 130/80 | HR 79 | Temp 97.9°F | Resp 17 | Wt 183.0 lb

## 2017-06-24 DIAGNOSIS — D649 Anemia, unspecified: Secondary | ICD-10-CM

## 2017-06-24 DIAGNOSIS — I6523 Occlusion and stenosis of bilateral carotid arteries: Secondary | ICD-10-CM

## 2017-06-24 DIAGNOSIS — F419 Anxiety disorder, unspecified: Secondary | ICD-10-CM | POA: Diagnosis not present

## 2017-06-24 DIAGNOSIS — M5136 Other intervertebral disc degeneration, lumbar region: Secondary | ICD-10-CM

## 2017-06-24 NOTE — Assessment & Plan Note (Signed)
Stable on Prozac.

## 2017-06-24 NOTE — Progress Notes (Signed)
  [name], MD Phone: [phone]  Bryan [name] is a 77 y.o. male who presents today for follow-up.  Patient recently underwent back surgery for spinal fusion in his lumbar spine.  He has done well since then.  The pain he had in his right leg is gone now.  He saw a Psychologist, sport and exercise on Tuesday and reports everything was good then.  He was advised by his surgeon that he needed to see me for his anemia.  He has been anemic and leukopenic and thrombocytopenic previously.  He appears to have gotten one treatment of IV iron.  He had an extensive workup for this previously and it was felt to be related to chronic disease.  He notes no bleeding.  He has undergone colonoscopy, EGD, and capsule endoscopy.  He is on omeprazole.  He notes minimal fatigue that is stable.  No shortness of breath or lightheadedness.  He is on Prozac for anxiety.  He notes he is anxious at times related to his health.  No depression.  Social History   Tobacco Use  Smoking Status Former Smoker  . Packs/day: 1.00  . Years: 32.00  . Pack years: 32.00  . Types: Cigarettes  . Last attempt to quit: 07/05/1981  . Years since quitting: 35.9  Smokeless Tobacco Never Used     ROS see history of present illness  Objective  Physical Exam Vitals:   06/24/17 1450  BP: 130/80  Pulse: 79  Resp: 17  Temp: 97.9 F (36.6 C)  SpO2: 95%    BP Readings from Last 3 Encounters:  06/24/17 130/80  06/20/17 140/68  06/10/17 (!) 132/51   Wt Readings from Last 3 Encounters:  06/24/17 183 lb (83 kg)  06/20/17 178 lb (80.7 kg)  06/08/17 178 lb (80.7 kg)    Physical Exam  Constitutional: No distress.  Eyes:  Slight conjunctival pallor  Cardiovascular: Normal rate, regular rhythm and normal heart sounds.  Pulmonary/Chest: Effort normal and breath sounds normal.  Musculoskeletal: He exhibits no edema.  Midline lumbar scar that appears to be well-healing with no signs of infection, nontender  Neurological: He is  alert.  Skin: Skin is warm and dry. He is not diaphoretic.     Assessment/Plan: Please see individual problem list.  Anemia Chronic history of pancytopenia.  Asymptomatic.  We will recheck lab work today.  We will get him into see hematology to consider iron transfusion or other treatment if needed.  Anxiety Stable on Prozac.  DDD (degenerative disc disease), lumbar Status post fusion.  Doing well.  He will continue to follow with his surgeon.   Orders Placed This Encounter  Procedures  . CBC w/Diff  . Iron, TIBC and Ferritin Panel    No orders of the defined types were placed in this encounter.    [name], MD [place]

## 2017-06-24 NOTE — Telephone Encounter (Signed)
Last office visit today, not on med list d/c

## 2017-06-24 NOTE — Assessment & Plan Note (Signed)
Chronic history of pancytopenia.  Asymptomatic.  We will recheck lab work today.  We will get him into see hematology to consider iron transfusion or other treatment if needed.

## 2017-06-24 NOTE — Assessment & Plan Note (Signed)
Status post fusion.  Doing well.  He will continue to follow with his surgeon.

## 2017-06-24 NOTE — Patient Instructions (Signed)
Nice to see you. We will check lab work today and contact you with the results. We will try to get you back into hematology to consider further management and evaluation of your anemia. If you develop chest pain, shortness of breath, worsening fatigue, or lightheadedness please be reevaluated.

## 2017-06-25 ENCOUNTER — Other Ambulatory Visit: Payer: Self-pay | Admitting: Family Medicine

## 2017-06-25 DIAGNOSIS — D649 Anemia, unspecified: Secondary | ICD-10-CM

## 2017-06-25 LAB — CBC WITH DIFFERENTIAL/PLATELET
Basophils Absolute: 40 cells/uL (ref 0–200)
Basophils Relative: 1.1 %
Eosinophils Absolute: 151 cells/uL (ref 15–500)
Eosinophils Relative: 4.2 %
HCT: 24.1 % — ABNORMAL LOW (ref 38.5–50.0)
Hemoglobin: 8.3 g/dL — ABNORMAL LOW (ref 13.2–17.1)
Lymphs Abs: 832 cells/uL — ABNORMAL LOW (ref 850–3900)
MCH: 32.3 pg (ref 27.0–33.0)
MCHC: 34.4 g/dL (ref 32.0–36.0)
MCV: 93.8 fL (ref 80.0–100.0)
MPV: 9.5 fL (ref 7.5–12.5)
Monocytes Relative: 9.4 %
Neutro Abs: 2239 cells/uL (ref 1500–7800)
Neutrophils Relative %: 62.2 %
Platelets: 252 10*3/uL (ref 140–400)
RBC: 2.57 10*6/uL — ABNORMAL LOW (ref 4.20–5.80)
RDW: 12.7 % (ref 11.0–15.0)
Total Lymphocyte: 23.1 %
WBC mixed population: 338 cells/uL (ref 200–950)
WBC: 3.6 10*3/uL — ABNORMAL LOW (ref 3.8–10.8)

## 2017-06-25 LAB — IRON,TIBC AND FERRITIN PANEL
%SAT: 6 % (calc) — ABNORMAL LOW (ref 15–60)
Ferritin: 106 ng/mL (ref 20–380)
Iron: 21 ug/dL — ABNORMAL LOW (ref 50–180)
TIBC: 365 mcg/dL (calc) (ref 250–425)

## 2017-06-27 ENCOUNTER — Telehealth: Payer: Self-pay | Admitting: Family Medicine

## 2017-06-27 ENCOUNTER — Encounter: Payer: Self-pay | Admitting: Emergency Medicine

## 2017-06-27 ENCOUNTER — Other Ambulatory Visit (INDEPENDENT_AMBULATORY_CARE_PROVIDER_SITE_OTHER): Payer: Medicare Other

## 2017-06-27 ENCOUNTER — Other Ambulatory Visit: Payer: Self-pay

## 2017-06-27 ENCOUNTER — Emergency Department
Admission: EM | Admit: 2017-06-27 | Discharge: 2017-06-27 | Disposition: A | Discharge status: Home / Self Care | Payer: Medicare Other | Attending: Emergency Medicine | Admitting: Emergency Medicine

## 2017-06-27 ENCOUNTER — Telehealth: Payer: Self-pay

## 2017-06-27 DIAGNOSIS — Z87891 Personal history of nicotine dependence: Secondary | ICD-10-CM | POA: Insufficient documentation

## 2017-06-27 DIAGNOSIS — Z952 Presence of prosthetic heart valve: Secondary | ICD-10-CM | POA: Insufficient documentation

## 2017-06-27 DIAGNOSIS — I10 Essential (primary) hypertension: Secondary | ICD-10-CM | POA: Insufficient documentation

## 2017-06-27 DIAGNOSIS — F419 Anxiety disorder, unspecified: Secondary | ICD-10-CM | POA: Diagnosis not present

## 2017-06-27 DIAGNOSIS — Z7982 Long term (current) use of aspirin: Secondary | ICD-10-CM | POA: Diagnosis not present

## 2017-06-27 DIAGNOSIS — D649 Anemia, unspecified: Secondary | ICD-10-CM

## 2017-06-27 DIAGNOSIS — I2510 Atherosclerotic heart disease of native coronary artery without angina pectoris: Secondary | ICD-10-CM | POA: Diagnosis not present

## 2017-06-27 DIAGNOSIS — Z79899 Other long term (current) drug therapy: Secondary | ICD-10-CM | POA: Insufficient documentation

## 2017-06-27 DIAGNOSIS — D61818 Other pancytopenia: Secondary | ICD-10-CM | POA: Diagnosis present

## 2017-06-27 DIAGNOSIS — Z96652 Presence of left artificial knee joint: Secondary | ICD-10-CM | POA: Insufficient documentation

## 2017-06-27 LAB — CBC
HCT: 25.2 % — ABNORMAL LOW (ref 39.0–52.0)
HCT: 27.3 % — ABNORMAL LOW (ref 40.0–52.0)
Hemoglobin: 8.3 g/dL — ABNORMAL LOW (ref 13.0–17.0)
Hemoglobin: 9 g/dL — ABNORMAL LOW (ref 13.0–18.0)
MCH: 30.7 pg (ref 26.0–34.0)
MCHC: 33 g/dL (ref 30.0–36.0)
MCHC: 32.8 g/dL (ref 32.0–36.0)
MCV: 94.4 fl (ref 78.0–100.0)
MCV: 93.6 fL (ref 80.0–100.0)
Platelets: 212 10*3/uL (ref 150.0–400.0)
Platelets: 223 10*3/uL (ref 150–440)
RBC: 2.67 Mil/uL — ABNORMAL LOW (ref 4.22–5.81)
RBC: 2.92 MIL/uL — ABNORMAL LOW (ref 4.40–5.90)
RDW: 13.7 % (ref 11.5–15.5)
RDW: 14 % (ref 11.5–14.5)
WBC: 3.2 10*3/uL — ABNORMAL LOW (ref 4.0–10.5)
WBC: 4 10*3/uL (ref 3.8–10.6)

## 2017-06-27 LAB — COMPREHENSIVE METABOLIC PANEL
ALT: 14 U/L — ABNORMAL LOW (ref 17–63)
AST: 23 U/L (ref 15–41)
Albumin: 3.8 g/dL (ref 3.5–5.0)
Alkaline Phosphatase: 85 U/L (ref 38–126)
Anion gap: 6 (ref 5–15)
BUN: 30 mg/dL — ABNORMAL HIGH (ref 6–20)
CO2: 25 mmol/L (ref 22–32)
Calcium: 8.9 mg/dL (ref 8.9–10.3)
Chloride: 103 mmol/L (ref 101–111)
Creatinine, Ser: 0.91 mg/dL (ref 0.61–1.24)
GFR calc Af Amer: >60 mL/min (ref >60)
GFR calc non Af Amer: >60 mL/min (ref >60)
Glucose, Bld: 106 mg/dL — ABNORMAL HIGH (ref 65–99)
Potassium: 4.7 mmol/L (ref 3.5–5.1)
Sodium: 134 mmol/L — ABNORMAL LOW (ref 135–145)
Total Bilirubin: 0.5 mg/dL (ref 0.3–1.2)
Total Protein: 6.8 g/dL (ref 6.5–8.1)

## 2017-06-27 LAB — TROPONIN I: Troponin I: <0.03 ng/mL (ref <0.03)

## 2017-06-27 NOTE — Telephone Encounter (Signed)
Patient's wife called back and said they are at home now and to please call the house phone [phone]

## 2017-06-27 NOTE — ED Notes (Signed)
Patient to room 36.  Patient states he is here for weakness.  Expresses displeasure at having to wait as long as he has.  T-98.1, P - 60 and regular, R-16, pulse ox - 97%.  Family at [facility].  Alert and oriented.

## 2017-06-27 NOTE — Telephone Encounter (Signed)
Spoke with patient.  Patient was upset that they did not do anything for him in the ED.  He states "I am not sure what you expected them to do for me there.  They did not do anything but stick me."  I discussed with the patient that we did not advise him to go to the ED today and that he made that decision on his own.  He then stated that I advised him to go over the weekend if he was feeling worse and he notes he was feeling fatigued.  Though this is chronic.  I discussed with the patient on Saturday being evaluated if he were to develop any chest pain, trouble breathing, or lightheadedness as those things could indicate a lower blood count.  I had previously discussed with the LPN in the office awaiting his lab result to determine if he would need an emergency room evaluation though she stated the patient advised her that he was just going to go to the emergency room because he would get taking care of quicker.  The patient's hemoglobin in the office came back at 8.3 and his hemoglobin in the emergency department came back at 9.0.  Discussed that that was not indication for any emergent treatment and that we would try to get him into see his hematologist.  He notes he did not necessarily want to see his hematologist unless they were going to do iron infusions.  We will send a message to his hematologist to see how they would like to proceed.

## 2017-06-27 NOTE — Telephone Encounter (Signed)
Patient in for lab appointment had questions while in office concerning his hematology appointment advised patient we would call hematology [place] to set up appointment for him . He advised he was going to the ED because he was tired of feeling fatigued and does not want to wait for hematology appointment. Patient denied SOB , chest pain or worsening fatigue, he just felt he would be treated sooner if he went to the ED. FYI

## 2017-06-27 NOTE — Telephone Encounter (Signed)
Please advise 

## 2017-06-27 NOTE — Telephone Encounter (Signed)
See message below.  Called patient on cell phone , left message advising him you were seeing patient in office.   According to chart he still in ER.

## 2017-06-27 NOTE — Discharge Instructions (Signed)
Fortunately today your blood counts were improving.  Please follow-up with hematology and with your primary care physician as scheduled and return to the emergency department for any concerns.  It was a pleasure to take care of you today, and thank you for coming to our emergency department.  If you have any questions or concerns before leaving please ask the nurse to grab me and I'm more than happy to go through your aftercare instructions again.  If you were prescribed any opioid pain medication today such as Norco, Vicodin, Percocet, morphine, hydrocodone, or oxycodone please make sure you do not drive when you are taking this medication as it can alter your ability to drive safely.  If you have any concerns once you are home that you are not improving or are in fact getting worse before you can make it to your follow-up appointment, please do not hesitate to call 911 and come back for further evaluation.  Darel Hong, MD  Results for orders placed or performed during the hospital encounter of 06/27/17  CBC  Result Value Ref Range   WBC 4.0 3.8 - 10.6 K/uL   RBC 2.92 (L) 4.40 - 5.90 MIL/uL   Hemoglobin 9.0 (L) 13.0 - 18.0 g/dL   HCT 27.3 (L) 40.0 - 52.0 %   MCV 93.6 80.0 - 100.0 fL   MCH 30.7 26.0 - 34.0 pg   MCHC 32.8 32.0 - 36.0 g/dL   RDW 14.0 11.5 - 14.5 %   Platelets 223 150 - 440 K/uL  Comprehensive metabolic panel  Result Value Ref Range   Sodium 134 (L) 135 - 145 mmol/L   Potassium 4.7 3.5 - 5.1 mmol/L   Chloride 103 101 - 111 mmol/L   CO2 25 22 - 32 mmol/L   Glucose, Bld 106 (H) 65 - 99 mg/dL   BUN 30 (H) 6 - 20 mg/dL   Creatinine, Ser 0.91 0.61 - 1.24 mg/dL   Calcium 8.9 8.9 - 10.3 mg/dL   Total Protein 6.8 6.5 - 8.1 g/dL   Albumin 3.8 3.5 - 5.0 g/dL   AST 23 15 - 41 U/L   ALT 14 (L) 17 - 63 U/L   Alkaline Phosphatase 85 38 - 126 U/L   Total Bilirubin 0.5 0.3 - 1.2 mg/dL   GFR calc non Af Amer >60 >60 mL/min   GFR calc Af Amer >60 >60 mL/min   Anion gap 6 5 -  15  Troponin I  Result Value Ref Range   Troponin I <0.03 <0.03 ng/mL   Dg Lumbar Spine 2-3 Views  Result Date: 06/08/2017 CLINICAL DATA:  PLIF. EXAM: LUMBAR SPINE - 2-3 VIEW COMPARISON:  CT lumbar spine dated March 09, 2017. FINDINGS: New bilateral L3 and left L4 pedicle screws. Prior L5-S1 posterior and interbody fusion. Prior L4 vertebroplasty with unchanged extruded cement within the L4-L5 disc space. Normal alignment. IMPRESSION: New pedicle screws in the L3 and L4 vertebral bodies. FLUOROSCOPY TIME:  23 seconds. C-arm fluoroscopic images were obtained intraoperatively and submitted for post operative interpretation. Electronically Signed   By: [name] M.D.   On: 06/08/2017 14:16   Dg C-arm 1-60 Min  Result Date: 06/08/2017 CLINICAL DATA:  PLIF EXAM: DG C-ARM 61-120 MIN COMPARISON:  03/09/2017 CT lumbar spine FINDINGS: Two low resolution intraoperative spot views of the lumbar spine. Total fluoroscopy time was 23 seconds. Surgical retractors and fixating screws are visible in the lumbar spine. Prior vertebral augmentation at L4. Interbody device at L5-S1. IMPRESSION: Intraoperative fluoroscopic  assistance provided during lumbar spine surgery. Electronically Signed   By: Donavan Foil M.D.   On: 06/08/2017 14:16

## 2017-06-27 NOTE — Telephone Encounter (Signed)
Copied from [place] [phone]. Topic: General - Other >> Jun 27, 2017  3:37 PM Oneta Rack wrote:  Relation to pt: self [phone]  Reason for call:  Patient currently at [facility] and he would like to speak with Dr. Caryl Bis directly stating "they haven't done anything" patient didn't want to elaborate, please advise >> Jun 27, 2017  3:40 PM Oneta Rack wrote:  Relation to pt: self [phone]  Reason for call:  Patient currently at [facility] and he would like to speak with Dr. Caryl Bis directly stating "they haven't done anything" patient didn't want to elaborate, please advise

## 2017-06-27 NOTE — Telephone Encounter (Signed)
Noted.  We can still try to get him into see hematology.  We will have Rasheedah call when she is back in the office.

## 2017-06-27 NOTE — ED Triage Notes (Signed)
Increased weakeness x 4 days. States has history of anemia and has been to hematology.

## 2017-06-27 NOTE — ED Provider Notes (Signed)
[facility] Emergency Department Provider Note  ____________________________________________   First MD Initiated Contact with Patient 06/27/17 1515     (approximate)  I have reviewed the triage vital signs and the nursing notes.   HISTORY  Chief Complaint Weakness   HPI Bryan [name] is a 77 y.o. male who comes to the emergency department requesting an iron transfusion.  He says that he has felt weak for the past several months and he has been diagnosed with pancytopenia and chronic anemia of unclear etiology.  He has previously received an iron transfusion from hematology but he does not want to go back to the hematologist because he does not like it there.  He says that oral iron makes him constipated.  He has no acute change in his symptoms he is just frustrated that he has not yet been able to get an iron transfusion and requests one today.  His symptoms have been insidious onset.  There is slowly progressive.  They are now moderate severity.  He feels fatigue with exertion and generally tired throughout the day.  He is in no pain.  Past Medical History:  Diagnosis Date  . Abnormal CT scan    Asymmetric left rectal wall thickening   . Allergy   . Anemia   . Arrhythmia   . Chronic lower back pain   . Complication of anesthesia    Memory loss 09/2015  . Coronary artery disease   . GERD (gastroesophageal reflux disease)   . Glaucoma   . H/O thoracic aortic aneurysm repair   . History of being hospitalized    memory lose kidney funtion down blood pressure up  . History of blood transfusion    "think he had one when he had heart valve OR" (10/14/2016)  . History of chicken pox   . Hyperlipidemia   . Hypertension   . Lumbar stenosis   . Neuropathy   . Osteoarthritis    worse in feet and ankles  . Paroxysmal A-fib ([place])   . Poor short term memory    takes Aricept  . Rheumatoid arthritis ([place])    "all over" (10/14/2016)  . Valvular heart  disease     Patient Active Problem List   Diagnosis Date Noted  . Anxiety 06/24/2017  . Lumbar burst fracture, closed, initial encounter ([place]) 06/08/2017  . Conjunctivitis, bacterial 03/22/2017  . Urge incontinence 12/15/2016  . Ataxia 11/09/2016  . Insomnia 11/09/2016  . Transient memory loss 11/09/2016  . Nausea and vomiting 10/15/2016  . Spinal stenosis of lumbar region 10/14/2016  . Leukopenia 09/27/2016  . Thrombocytopenia ([place]) 09/27/2016  . Other pancytopenia ([place]) 09/27/2016  . History of left knee replacement 09/01/2016  . Fall 09/01/2016  . HNP (herniated nucleus pulposus with myelopathy), thoracic 03/08/2016  . Rectal nodule 02/23/2016  . DDD (degenerative disc disease), lumbar 01/02/2016  . Lumbar radiculopathy 01/02/2016  . Facet syndrome, lumbar 01/02/2016  . Greater trochanteric bursitis 11/28/2015  . Acute renal failure ([place]) 11/06/2015  . Dehydration 10/17/2015  . IBS (irritable bowel syndrome) 07/15/2015  . Erectile dysfunction of organic origin 07/15/2015  . Carotid stenosis 05/06/2015  . Difficulty sleeping 04/15/2015  . 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 Chronic kidney disease 02/17/2015  . 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  . Enlarged prostate with lower urinary tract symptoms (LUTS) 02/17/2015  . Rheumatoid arthritis ([place]) 10/01/2014  . Sacroiliac joint disease 10/01/2014  . Fusion of spine, sacral and sacrococcygeal region  10/01/2014  . DDD (degenerative disc disease), cervical 10/01/2014  . Lumbar and sacral osteoarthritis 10/01/2014  . Mechanical and motor problems with internal organs 06/20/2014  . Iron deficiency anemia 06/19/2014  . Anemia 06/19/2014  . Aneurysm, ascending aorta ([place]) 05/27/2014  . Aneurysm of thoracic aorta ([place]) 05/27/2014  . Thoracic aortic aneurysm without rupture ([place]) 05/27/2014  . Cognitive decline 04/01/2014  . Spinal stenosis of lumbar region with radiculopathy  01/30/2014  . Vitamin D deficiency 01/30/2014  . Dorsalgia, unspecified 01/30/2014  . Lumbar canal stenosis 01/30/2014  . Paroxysmal atrial fibrillation ([place]) 11/27/2013  . S/P AVR (aortic valve replacement) 11/27/2013  . History of ascending aorta repair 11/27/2013  . Hyperlipidemia 11/27/2013  . Essential hypertension 11/27/2013  . Bicuspid aortic valve 11/27/2013  . Atherosclerotic heart disease of native coronary artery without angina pectoris 11/27/2013  . 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 History of open heart surgery 11/27/2013  . Carotid artery insufficiency syndrome 11/06/2010    Past Surgical History:  Procedure Laterality Date  . AORTIC VALVE REPLACEMENT  2007   [facility].  [place]  . APPENDECTOMY  2010  . BACK SURGERY    . CARDIAC VALVE REPLACEMENT    . CATARACT EXTRACTION W/ INTRAOCULAR LENS  IMPLANT, BILATERAL Bilateral   . COLONOSCOPY WITH PROPOFOL N/A 09/24/2016   Procedure: COLONOSCOPY WITH PROPOFOL;  Surgeon: Jonathon Bellows, MD;  Location: [facility] ENDOSCOPY;  Service: Endoscopy;  Laterality: N/A;  . ESOPHAGOGASTRODUODENOSCOPY (EGD) WITH PROPOFOL N/A 09/24/2016   Procedure: ESOPHAGOGASTRODUODENOSCOPY (EGD) WITH PROPOFOL;  Surgeon: Jonathon Bellows, MD;  Location: [facility] ENDOSCOPY;  Service: Endoscopy;  Laterality: N/A;  . GIVENS CAPSULE STUDY N/A 09/24/2016   Procedure: GIVENS CAPSULE STUDY;  Surgeon: Jonathon Bellows, MD;  Location: [facility] ENDOSCOPY;  Service: Endoscopy;  Laterality: N/A;  . HAMMER TOE SURGERY Right 10/09/2015   Procedure: HAMMER TOE REPAIR WITH K-WIRE FIXATION RIGHT SECOND TOE;  Surgeon: Albertine Patricia, DPM;  Location: [place];  Service: Podiatry;  Laterality: Right;  WITH LOCAL  . JOINT REPLACEMENT Left 2008   knee  . LAPAROSCOPIC CHOLECYSTECTOMY  2010  . LUMBAR FUSION Right 06/08/2017   LUMBAR THREE-FOUR, LUMBAR FOUR-FIVE POSTEROLATERAL ARTHRODESIS WITH RIGHT LUMBAR FOUR-FIVE LAMINECTOMY/FORAMINOTOMY  . LUMBAR  LAMINECTOMY/DECOMPRESSION MICRODISCECTOMY Right 03/08/2016   Procedure: Laminectomy and Foraminotomy - Lumbar Five-Sacral One Right;  Surgeon: Kary Kos, MD;  Location: [place];  Service: Neurosurgery;  Laterality: Right;  Right  . MULTIPLE TOOTH EXTRACTIONS     "3"  . POSTERIOR LUMBAR FUSION  10/14/2016   L5-S1  . REPLACEMENT TOTAL KNEE Left 2003  . THORACIC AORTIC ANEURYSM REPAIR  2010    Prior to Admission medications   Medication Sig Start Date End Date Taking? Authorizing Provider  amLODipine (NORVASC) 5 MG tablet TAKE ONE TABLET BY MOUTH EVERY DAY 06/14/17   [name], MD  amoxicillin (AMOXIL) 500 MG capsule Take 2,000 mg by mouth See admin instructions. Take 4 Capsules prior to dental appointment 12/24/16   [provider]  aspirin 325 MG EC tablet Take 325 mg by mouth daily.    [provider]  atorvastatin (LIPITOR) 40 MG tablet Take 1 tablet (40 mg total) by mouth daily. Patient taking differently: Take 40 mg by mouth daily at 6 PM.  12/15/16   [name], MD  brimonidine (ALPHAGAN) 0.2 % ophthalmic solution Place 1 drop into both eyes every morning.  10/28/15   [provider]  cetirizine (ZYRTEC) 10 MG tablet Take 10 mg  by mouth daily.    [provider]  donepezil (ARICEPT) 10 MG tablet Take 10 mg by mouth at bedtime.    [provider]  fenofibrate 160 MG tablet TAKE ONE TABLET BY MOUTH EVERY DAY 05/16/17   Minna Merritts, MD  FLUoxetine (PROZAC) 20 MG capsule TAKE 1 CAPSULE BY MOUTH DAILY 12/16/16   [name], MD  folic acid (FOLVITE) 1 MG tablet Take 1 mg by mouth 2 (two) times daily.    [provider]  gabapentin (NEURONTIN) 400 MG capsule Take 400 mg by mouth 3 (three) times daily.  01/17/17   [provider]  HYDROcodone-acetaminophen (NORCO) 10-325 MG tablet Take 1 tablet by mouth every 4 (four) hours as needed for moderate pain. 06/10/17   Meyran, Ocie Cornfield, NP  hydrocortisone cream 1  % Apply 1 application topically daily as needed for itching.    [provider]  lisinopril (PRINIVIL,ZESTRIL) 20 MG tablet TAKE ONE TABLET BY MOUTH EVERY EVENING 12/24/16   Minna Merritts, MD  omeprazole (PRILOSEC) 40 MG capsule TAKE 1 CAPSULE BY MOUTH DAILY 02/28/17   Jonathon Bellows, MD  oxymetazoline (AFRIN) 0.05 % nasal spray Place 1 spray into both nostrils daily as needed for congestion.    [provider]  polyethylene glycol powder (GLYCOLAX/MIRALAX) powder USE 1 CAPFUL IN 4 TO 8 OUNCES OF LIQUID DAILY 06/24/17   [name], MD  tamsulosin (FLOMAX) 0.4 MG CAPS capsule TAKE 1 CAPSULE BY MOUTH DAILY 04/29/17   Crecencio Mc, MD  traZODone (DESYREL) 50 MG tablet Take 50-100 mg by mouth at bedtime. 1-2 tablets depending on insomnia 04/15/15   [provider]    Allergies Penicillins and Demerol [meperidine]  Family History  Problem Relation Age of Onset  . Heart failure Mother   . Hypertension Mother   . Asthma Mother   . Heart attack Father 59       MI  . Hypertension Sister   . Prostate cancer Neg Hx   . Bladder Cancer Neg Hx   . Kidney cancer Neg Hx     Social History Social History   Tobacco Use  . Smoking status: Former Smoker    Packs/day: 1.00    Years: 32.00    Pack years: 32.00    Types: Cigarettes    Last attempt to quit: 07/05/1981    Years since quitting: 36.0  . Smokeless tobacco: Never Used  Substance Use Topics  . Alcohol use: Yes    Comment: Occasional glass of wine  . Drug use: No    Review of Systems Constitutional: No fever/chills Cardiovascular: Denies chest pain. Respiratory: Denies shortness of breath. Gastrointestinal: No abdominal pain.  No nausea, no vomiting.   Genitourinary: Negative for dysuria. Musculoskeletal: Negative for back pain. Skin: Negative for rash. Neurological: Negative for headaches   ____________________________________________   PHYSICAL EXAM:  VITAL SIGNS: ED Triage Vitals  Enc  Vitals Group     BP 06/27/17 1212 (!) 142/66     Pulse Rate 06/27/17 1212 60     Resp 06/27/17 1212 18     Temp 06/27/17 1212 98.3 F (36.8 C)     Temp Source 06/27/17 1212 Oral     SpO2 06/27/17 1212 95 %     Weight 06/27/17 1213 174 lb (78.9 kg)     Height 06/27/17 1213 6' (1.829 m)     Head Circumference --      Peak Flow --  Pain Score --      Pain Loc --      Pain Edu? --      Excl. in [place]? --     Constitutional: Alert and oriented x4 appears somewhat angry nontoxic no diaphoresis speaks in full clear sentences Head: Atraumatic. Nose: No congestion/rhinnorhea. Cardiovascular: Normal rate, regular rhythm. Grossly normal heart sounds.  Good peripheral circulation. Respiratory: Normal respiratory effort.  No retractions. Lungs CTAB and moving good air Musculoskeletal: No lower extremity edema   Neurologic:  Normal speech and language. No gross focal neurologic deficits are appreciated. Skin:  Skin is warm, dry and intact. No rash noted. Psychiatric: Angry affect    ____________________________________________   DIFFERENTIAL includes but not limited to  Chronic anemia, pancytopenia, GI bleed ____________________________________________   LABS (all labs ordered are listed, but only abnormal results are displayed)  Labs Reviewed  CBC - Abnormal; Notable for the following components:      Result Value   RBC 2.92 (*)    Hemoglobin 9.0 (*)    HCT 27.3 (*)    All other components within normal limits  COMPREHENSIVE METABOLIC PANEL - Abnormal; Notable for the following components:   Sodium 134 (*)    Glucose, Bld 106 (*)    BUN 30 (*)    ALT 14 (*)    All other components within normal limits  TROPONIN I    Lab work reviewed by me with improving hemoglobin __________________________________________  EKG   ____________________________________________  RADIOLOGY  ____________________________________________   PROCEDURES  Procedure(s) performed:  no  Procedures  Critical Care performed: no  Observation: no ____________________________________________   INITIAL IMPRESSION / ASSESSMENT AND PLAN / ED COURSE  Pertinent labs & imaging results that were available during my care of the patient were reviewed by me and considered in my medical decision making (see chart for details).  The patient is hemodynamically stable with non-focal exam.  His hemoglobin is actually improved.  I reviewed the patient's note from his primary care physician earlier today who had referred him on to hematology.  The patient himself is quite frustrated and is requesting an iron transfusion which I explained we do not do in the emergency department.  I have advised him to continue his follow-up with hematology.  The patient is discharged home in stable condition.      ____________________________________________   FINAL CLINICAL IMPRESSION(S) / ED DIAGNOSES  Final diagnoses:  Chronic anemia      NEW MEDICATIONS STARTED DURING THIS VISIT:  Discharge Medication List as of 06/27/2017  3:26 PM       Note:  This document was prepared using Dragon voice recognition software and may include unintentional dictation errors.     Darel Hong, MD 06/29/17 [phone]

## 2017-06-29 DIAGNOSIS — M5416 Radiculopathy, lumbar region: Secondary | ICD-10-CM | POA: Diagnosis not present

## 2017-06-29 DIAGNOSIS — I480 Paroxysmal atrial fibrillation: Secondary | ICD-10-CM | POA: Diagnosis not present

## 2017-06-29 DIAGNOSIS — M48061 Spinal stenosis, lumbar region without neurogenic claudication: Secondary | ICD-10-CM | POA: Diagnosis not present

## 2017-06-29 DIAGNOSIS — I2510 Atherosclerotic heart disease of native coronary artery without angina pectoris: Secondary | ICD-10-CM | POA: Diagnosis not present

## 2017-06-29 DIAGNOSIS — S32041D Stable burst fracture of fourth lumbar vertebra, subsequent encounter for fracture with routine healing: Secondary | ICD-10-CM | POA: Diagnosis not present

## 2017-06-29 DIAGNOSIS — I10 Essential (primary) hypertension: Secondary | ICD-10-CM | POA: Diagnosis not present

## 2017-07-04 ENCOUNTER — Telehealth: Payer: Self-pay | Admitting: Family Medicine

## 2017-07-04 DIAGNOSIS — M48061 Spinal stenosis, lumbar region without neurogenic claudication: Secondary | ICD-10-CM | POA: Diagnosis not present

## 2017-07-04 DIAGNOSIS — D649 Anemia, unspecified: Secondary | ICD-10-CM

## 2017-07-04 DIAGNOSIS — I10 Essential (primary) hypertension: Secondary | ICD-10-CM | POA: Diagnosis not present

## 2017-07-04 DIAGNOSIS — I2510 Atherosclerotic heart disease of native coronary artery without angina pectoris: Secondary | ICD-10-CM | POA: Diagnosis not present

## 2017-07-04 DIAGNOSIS — I480 Paroxysmal atrial fibrillation: Secondary | ICD-10-CM | POA: Diagnosis not present

## 2017-07-04 DIAGNOSIS — M5416 Radiculopathy, lumbar region: Secondary | ICD-10-CM | POA: Diagnosis not present

## 2017-07-04 DIAGNOSIS — S32041D Stable burst fracture of fourth lumbar vertebra, subsequent encounter for fracture with routine healing: Secondary | ICD-10-CM | POA: Diagnosis not present

## 2017-07-04 NOTE — Telephone Encounter (Signed)
Please let the patient know that I heard back from his hematologist.  She noted that she was uncertain that he would respond to IV iron based on his iron stores being relatively normal when we checked them.  It appears the workup revealed anemia of chronic disease.  She also mentioned having his testosterone evaluated which has been low previously.  Please see if he was ever evaluated for his testosterone levels by his urologist as he stated he would be when we offered referral to endocrinology.

## 2017-07-04 NOTE — Telephone Encounter (Signed)
-----   Message from Lequita Asal, MD sent at 07/04/2017 12:26 PM EST ----- Regarding: RE: Question about this patient  Randall Hiss,  When I saw him last year, he had had anemia of chronic disease.  There was some component of iron deficiency.  Bone marrow showed no dysplasia.  Cytogenetics were normal.  Iron stores were present.  Iron studies from 06/24/2017 show a normal ferritin, but a low iron saturation and normal TIBC.  Unclear if ferritin falsely elevated (acute phase reactant).  Previously his sed rate was normal.    I am uncertain if he will respond to IV iron.  We also talked about checking testosterone as a low level may be contributing.  Melissa   ----- Message ----- From: [name], MD Sent: 06/27/2017   8:10 PM To: Lequita Asal, MD Subject: Question about this patient                    Hi Dr [name],   I followed up with Mr Steeves late last week and rechecked a CBC. His hemoglobin has been trending down recently as outlined in his labs. He notes chronic fatigue that is not worse than previously, though notes no other symptoms. He went to the ED today and his hemoglobin improved slightly to 9.0 from 8.3. I discussed getting him in for follow-up with you though he stated he only wanted to see you for follow-up if you were going to give him an iron transfusion. I advised him I would update you on his CBC results and see how you wanted to proceed. Please let me know what the next step is from your perspective so I can relay it to Mr Laday. Thanks for your help.   [name]   ----- Message ----- From: Interface, [facility] Sent: 06/27/2017   2:46 PM To: [name], MD

## 2017-07-05 NOTE — Telephone Encounter (Signed)
patient notified and states he has been evaluated for testosterone and he is receiving monthly testosterone injections.He would like to know what he needs to do for his iron.

## 2017-07-05 NOTE — Telephone Encounter (Signed)
Patient does not want to see hematology at this time

## 2017-07-05 NOTE — Telephone Encounter (Signed)
Noted.  We could plan on rechecking his hemoglobin to ensure that it is stable.  If he is willing to do this please order a CBC for anemia.  Thanks.

## 2017-07-05 NOTE — Addendum Note (Signed)
Addended by: Juanda Chance on: 07/05/2017 04:47 PM   Modules accepted: Orders

## 2017-07-05 NOTE — Telephone Encounter (Signed)
Patient notified and scheduled 

## 2017-07-05 NOTE — Telephone Encounter (Signed)
We can get him set up to follow-up with hematology. At this time that is the option as he has not been able to tolerate iron tablets. If he would like [place] to get him an appointment please let [name] know so she can get him scheduled. Thanks.

## 2017-07-06 DIAGNOSIS — M48061 Spinal stenosis, lumbar region without neurogenic claudication: Secondary | ICD-10-CM | POA: Diagnosis not present

## 2017-07-06 DIAGNOSIS — I2510 Atherosclerotic heart disease of native coronary artery without angina pectoris: Secondary | ICD-10-CM | POA: Diagnosis not present

## 2017-07-06 DIAGNOSIS — I10 Essential (primary) hypertension: Secondary | ICD-10-CM | POA: Diagnosis not present

## 2017-07-06 DIAGNOSIS — S32041D Stable burst fracture of fourth lumbar vertebra, subsequent encounter for fracture with routine healing: Secondary | ICD-10-CM | POA: Diagnosis not present

## 2017-07-06 DIAGNOSIS — I480 Paroxysmal atrial fibrillation: Secondary | ICD-10-CM | POA: Diagnosis not present

## 2017-07-06 DIAGNOSIS — M5416 Radiculopathy, lumbar region: Secondary | ICD-10-CM | POA: Diagnosis not present

## 2017-07-06 NOTE — Progress Notes (Signed)
I spoke with the hematologist office and scheduled Bryan [name] an appt to see Dr [name] for 07/22/2017 @ 3:00pm. I called Bryan [name] to advise Bryan [name] of appt Bryan [name] states he does not want to go to the hematologist and to cancel appt. Appt was cancelled.

## 2017-07-07 ENCOUNTER — Ambulatory Visit: Payer: Medicare Other

## 2017-07-08 ENCOUNTER — Telehealth: Payer: Self-pay | Admitting: Radiology

## 2017-07-08 DIAGNOSIS — D649 Anemia, unspecified: Secondary | ICD-10-CM

## 2017-07-08 NOTE — Telephone Encounter (Signed)
Pt coming in for labs Monday, please place future orders. Thank you 

## 2017-07-08 NOTE — Addendum Note (Signed)
Addended by: Caryl Bis, Reizy Dunlow G on: 07/08/2017 10:01 AM   Modules accepted: Orders

## 2017-07-08 NOTE — Telephone Encounter (Signed)
cbc

## 2017-07-11 ENCOUNTER — Other Ambulatory Visit (INDEPENDENT_AMBULATORY_CARE_PROVIDER_SITE_OTHER): Payer: Medicare Other

## 2017-07-11 ENCOUNTER — Other Ambulatory Visit: Payer: Self-pay | Admitting: Cardiovascular Disease

## 2017-07-11 DIAGNOSIS — D649 Anemia, unspecified: Secondary | ICD-10-CM | POA: Diagnosis not present

## 2017-07-12 DIAGNOSIS — M5440 Lumbago with sciatica, unspecified side: Secondary | ICD-10-CM | POA: Diagnosis not present

## 2017-07-12 DIAGNOSIS — S32040A Wedge compression fracture of fourth lumbar vertebra, initial encounter for closed fracture: Secondary | ICD-10-CM | POA: Diagnosis not present

## 2017-07-12 LAB — CBC
HCT: 29 % — ABNORMAL LOW (ref 39.0–52.0)
Hemoglobin: 9.5 g/dL — ABNORMAL LOW (ref 13.0–17.0)
MCHC: 32.9 g/dL (ref 30.0–36.0)
MCV: 94 fl (ref 78.0–100.0)
Platelets: 172 10*3/uL (ref 150.0–400.0)
RBC: 3.09 Mil/uL — ABNORMAL LOW (ref 4.22–5.81)
RDW: 13.8 % (ref 11.5–15.5)
WBC: 4.3 10*3/uL (ref 4.0–10.5)

## 2017-07-13 DIAGNOSIS — I480 Paroxysmal atrial fibrillation: Secondary | ICD-10-CM | POA: Diagnosis not present

## 2017-07-13 DIAGNOSIS — M5416 Radiculopathy, lumbar region: Secondary | ICD-10-CM | POA: Diagnosis not present

## 2017-07-13 DIAGNOSIS — I2510 Atherosclerotic heart disease of native coronary artery without angina pectoris: Secondary | ICD-10-CM | POA: Diagnosis not present

## 2017-07-13 DIAGNOSIS — S32041D Stable burst fracture of fourth lumbar vertebra, subsequent encounter for fracture with routine healing: Secondary | ICD-10-CM | POA: Diagnosis not present

## 2017-07-13 DIAGNOSIS — I10 Essential (primary) hypertension: Secondary | ICD-10-CM | POA: Diagnosis not present

## 2017-07-13 DIAGNOSIS — M48061 Spinal stenosis, lumbar region without neurogenic claudication: Secondary | ICD-10-CM | POA: Diagnosis not present

## 2017-07-15 DIAGNOSIS — I2510 Atherosclerotic heart disease of native coronary artery without angina pectoris: Secondary | ICD-10-CM | POA: Diagnosis not present

## 2017-07-15 DIAGNOSIS — S32041D Stable burst fracture of fourth lumbar vertebra, subsequent encounter for fracture with routine healing: Secondary | ICD-10-CM | POA: Diagnosis not present

## 2017-07-15 DIAGNOSIS — I10 Essential (primary) hypertension: Secondary | ICD-10-CM | POA: Diagnosis not present

## 2017-07-15 DIAGNOSIS — M48061 Spinal stenosis, lumbar region without neurogenic claudication: Secondary | ICD-10-CM | POA: Diagnosis not present

## 2017-07-15 DIAGNOSIS — I480 Paroxysmal atrial fibrillation: Secondary | ICD-10-CM | POA: Diagnosis not present

## 2017-07-15 DIAGNOSIS — M5416 Radiculopathy, lumbar region: Secondary | ICD-10-CM | POA: Diagnosis not present

## 2017-07-22 ENCOUNTER — Ambulatory Visit: Payer: Medicare Other | Admitting: Hematology and Oncology

## 2017-07-27 ENCOUNTER — Other Ambulatory Visit: Payer: Self-pay | Admitting: Urology

## 2017-07-29 NOTE — Telephone Encounter (Signed)
Patient is requesting a refill on his testosterone cypionate, but he is overdue for his 6 months follow up.  I will refill the medication one time to cover him until he makes a follow up appointment.

## 2017-08-11 ENCOUNTER — Telehealth: Payer: Self-pay | Admitting: Cardiovascular Disease

## 2017-08-11 NOTE — Telephone Encounter (Signed)
Spoke with patient and he reports seeing the news mention aspirin not indicated for those over 70. Reviewed that based on his heart health history he should remain on his current regimen. He was agreeable with this and had no further questions at this time.

## 2017-08-11 NOTE — Telephone Encounter (Signed)
Pt calling stating he heard that patients over 70 may need to be on aspirin  He wanted to know how would he stand on that point  Please call back

## 2017-08-23 ENCOUNTER — Other Ambulatory Visit: Payer: Self-pay | Admitting: Neurosurgery

## 2017-08-23 DIAGNOSIS — M5440 Lumbago with sciatica, unspecified side: Secondary | ICD-10-CM

## 2017-08-23 DIAGNOSIS — Z6825 Body mass index (BMI) 25.0-25.9, adult: Secondary | ICD-10-CM | POA: Diagnosis not present

## 2017-08-23 DIAGNOSIS — I10 Essential (primary) hypertension: Secondary | ICD-10-CM | POA: Diagnosis not present

## 2017-08-24 ENCOUNTER — Ambulatory Visit
Admission: RE | Admit: 2017-08-24 | Discharge: 2017-08-24 | Disposition: A | Discharge status: Home / Self Care | Payer: Medicare Other | Source: Ambulatory Visit | Attending: Neurosurgery | Admitting: Neurosurgery

## 2017-08-24 DIAGNOSIS — M5440 Lumbago with sciatica, unspecified side: Secondary | ICD-10-CM | POA: Insufficient documentation

## 2017-08-24 DIAGNOSIS — M5126 Other intervertebral disc displacement, lumbar region: Secondary | ICD-10-CM | POA: Diagnosis not present

## 2017-08-27 ENCOUNTER — Other Ambulatory Visit: Payer: Self-pay | Admitting: Cardiovascular Disease

## 2017-09-14 ENCOUNTER — Other Ambulatory Visit: Payer: Self-pay | Admitting: Neurosurgery

## 2017-09-19 NOTE — Pre-Procedure Instructions (Signed)
Bryan [name]  09/19/2017      [facility] - [place], [place] - [address] [place] [address] Phone: [phone] Fax: [phone]    Your procedure is scheduled on May 8  Report to [place] at 1000 A.M.  Call this number if you have problems the morning of surgery:  [phone]   Remember:  Do not eat food or drink liquids after midnight.  Continue all medications as directed by your physician except follow these medication instructions before surgery below   Take these medicines the morning of surgery with A SIP OF WATER  amLODipine (NORVASC)  cetirizine (ZYRTEC)  FLUoxetine (PROZAC)  gabapentin (NEURONTIN HYDROcodone-acetaminophen (NORCO)  omeprazole (PRILOSEC) tamsulosin (FLOMAX)   7 days prior to surgery STOP taking any Aspirin(unless otherwise instructed by your surgeon), Aleve, Naproxen, Ibuprofen, Motrin, Advil, Goody's, BC's, all herbal medications, fish oil, and all vitamins    Do not wear jewelry  Do not wear lotions, powders, or cologne, or deodorant.  Men may shave face and neck.  Do not bring valuables to the hospital.  [facility] is not responsible for any belongings or valuables.  Contacts, dentures or bridgework may not be worn into surgery.  Leave your suitcase in the car.  After surgery it may be brought to your room.  For patients admitted to the hospital, discharge time will be determined by your treatment team.  Patients discharged the day of surgery will not be allowed to drive home.    Special instructions:   [place]- Preparing For Surgery  Before surgery, you can play an important role. Because skin is not sterile, your skin needs to be as free of germs as possible. You can reduce the number of germs on your skin by washing with CHG (chlorahexidine gluconate) Soap before surgery.  CHG is an antiseptic cleaner which kills germs and bonds with the skin to continue killing  germs even after washing.  Please do not use if you have an allergy to CHG or antibacterial soaps. If your skin becomes reddened/irritated stop using the CHG.  Do not shave (including legs and underarms) for at least 48 hours prior to first CHG shower. It is OK to shave your face.  Please follow these instructions carefully.   1. Shower the NIGHT BEFORE SURGERY and the MORNING OF SURGERY with CHG.   2. If you chose to wash your hair, wash your hair first as usual with your normal shampoo.  3. After you shampoo, rinse your hair and body thoroughly to remove the shampoo.  4. Use CHG as you would any other liquid soap. You can apply CHG directly to the skin and wash gently with a scrungie or a clean washcloth.   5. Apply the CHG Soap to your body ONLY FROM THE NECK DOWN.  Do not use on open wounds or open sores. Avoid contact with your eyes, ears, mouth and genitals (private parts). Wash Face and genitals (private parts)  with your normal soap.  6. Wash thoroughly, paying special attention to the area where your surgery will be performed.  7. Thoroughly rinse your body with warm water from the neck down.  8. DO NOT shower/wash with your normal soap after using and rinsing off the CHG Soap.  9. Pat yourself dry with a CLEAN TOWEL.  10. Wear CLEAN PAJAMAS to bed the night before surgery, wear comfortable clothes the morning of surgery  11. Place CLEAN  SHEETS on your bed the night of your first shower and DO NOT SLEEP WITH PETS.    Day of Surgery: Do not apply any deodorants/lotions. Please wear clean clothes to the hospital/surgery center.      Please read over the following fact sheets that you were given.

## 2017-09-20 ENCOUNTER — Other Ambulatory Visit: Payer: Self-pay

## 2017-09-20 ENCOUNTER — Encounter (HOSPITAL_COMMUNITY)
Admission: RE | Admit: 2017-09-20 | Discharge: 2017-09-20 | Disposition: A | Discharge status: Home / Self Care | Payer: Medicare Other | Source: Ambulatory Visit | Attending: Neurosurgery | Admitting: Neurosurgery

## 2017-09-20 ENCOUNTER — Encounter (HOSPITAL_COMMUNITY): Payer: Self-pay

## 2017-09-20 ENCOUNTER — Telehealth: Payer: Self-pay | Admitting: Cardiovascular Disease

## 2017-09-20 DIAGNOSIS — Z79899 Other long term (current) drug therapy: Secondary | ICD-10-CM | POA: Insufficient documentation

## 2017-09-20 DIAGNOSIS — E785 Hyperlipidemia, unspecified: Secondary | ICD-10-CM | POA: Insufficient documentation

## 2017-09-20 DIAGNOSIS — M48061 Spinal stenosis, lumbar region without neurogenic claudication: Secondary | ICD-10-CM | POA: Insufficient documentation

## 2017-09-20 DIAGNOSIS — I10 Essential (primary) hypertension: Secondary | ICD-10-CM | POA: Insufficient documentation

## 2017-09-20 DIAGNOSIS — Z9889 Other specified postprocedural states: Secondary | ICD-10-CM | POA: Insufficient documentation

## 2017-09-20 DIAGNOSIS — Z87891 Personal history of nicotine dependence: Secondary | ICD-10-CM | POA: Insufficient documentation

## 2017-09-20 DIAGNOSIS — Z01812 Encounter for preprocedural laboratory examination: Secondary | ICD-10-CM | POA: Insufficient documentation

## 2017-09-20 DIAGNOSIS — M961 Postlaminectomy syndrome, not elsewhere classified: Secondary | ICD-10-CM | POA: Insufficient documentation

## 2017-09-20 DIAGNOSIS — I2510 Atherosclerotic heart disease of native coronary artery without angina pectoris: Secondary | ICD-10-CM | POA: Insufficient documentation

## 2017-09-20 DIAGNOSIS — Z7982 Long term (current) use of aspirin: Secondary | ICD-10-CM | POA: Insufficient documentation

## 2017-09-20 LAB — BASIC METABOLIC PANEL
Anion gap: 6 (ref 5–15)
BUN: 22 mg/dL — ABNORMAL HIGH (ref 6–20)
CO2: 27 mmol/L (ref 22–32)
Calcium: 9.2 mg/dL (ref 8.9–10.3)
Chloride: 103 mmol/L (ref 101–111)
Creatinine, Ser: 1.2 mg/dL (ref 0.61–1.24)
GFR calc Af Amer: >60 mL/min (ref >60)
GFR calc non Af Amer: 56 mL/min — ABNORMAL LOW (ref >60)
Glucose, Bld: 91 mg/dL (ref 65–99)
Potassium: 4.5 mmol/L (ref 3.5–5.1)
Sodium: 136 mmol/L (ref 135–145)

## 2017-09-20 LAB — CBC
HCT: 33.1 % — ABNORMAL LOW (ref 39.0–52.0)
Hemoglobin: 10.5 g/dL — ABNORMAL LOW (ref 13.0–17.0)
MCH: 28.1 pg (ref 26.0–34.0)
MCHC: 31.7 g/dL (ref 30.0–36.0)
MCV: 88.5 fL (ref 78.0–100.0)
Platelets: 124 10*3/uL — ABNORMAL LOW (ref 150–400)
RBC: 3.74 MIL/uL — ABNORMAL LOW (ref 4.22–5.81)
RDW: 14.6 % (ref 11.5–15.5)
WBC: 4.1 10*3/uL (ref 4.0–10.5)

## 2017-09-20 LAB — SURGICAL PCR SCREEN
MRSA, PCR: NEGATIVE
Staphylococcus aureus: NEGATIVE

## 2017-09-20 NOTE — Telephone Encounter (Signed)
Patient checking on status Please call to discuss

## 2017-09-20 NOTE — Progress Notes (Addendum)
PCP: [name], MD  Cardiologist: Ida Rogue, MD  EKG: 06/29/17 in EPIC  Stress test: pt denies ever  ECHO: 11/27/15 in EPIC  Cardiac Cath: pt denies ever  Chest x-ray: pt denies past year, no recent respiratory infections/complications  Pt has not received orders to stop aspirin, he is calling Dr. [name] today to confirm.  Called Dr. Donivan Scull office and spoke with Enis Gash, informed her of surgery/surgery date/surgeon and question regarding aspirin. She is sending a message to Dr. [name] and has my call back number as will as Shelby Dubin. PA-C's contact number (in case she is unable to get instructions today) to call and inform of instructions on patient continuing or holding aspirin 235 mg prior to surgery

## 2017-09-20 NOTE — Telephone Encounter (Signed)
Left voicemail message to call back  

## 2017-09-20 NOTE — Telephone Encounter (Signed)
° °  [facility] Pre-operative Risk Assessment    Request for surgical clearance:  1. What type of surgery is being performed? Posterolateral fusion  2. When is this surgery scheduled? 09/28/17   3. What type of clearance is required (medical clearance vs. Pharmacy clearance to hold med vs. Both)? Both   4. Are there any medications that need to be held prior to surgery and how long?  Aspirin   5. Practice name and name of physician performing surgery? Dr Kary Kos, with [facility]   6. What is your office phone number [phone]   7.   What is your office fax number   8.   Anesthesia type (None, local, MAC, general) ?   Raquel Sarna with [facility] calling asking about above Please call back  If we happen to call after 4pm today Please call  Shelby Dubin ([place]) [phone]

## 2017-09-20 NOTE — Telephone Encounter (Signed)
Spoke with Ebony Hail PA with anesthesia and let her know that I would make sure that provider reviews for clearance.

## 2017-09-21 NOTE — Progress Notes (Addendum)
Anesthesia Chart Review:  Case:  [id number] Date/Time:  09/28/17 1156   Procedures:      L1 Revision and extension of hardware (N/A Back)     L3-4 POSTEROR LATERAL FUSION REVISION - L1-2 L-2-3 (N/A )   Anesthesia type:  General   Pre-op diagnosis:  hardware failure - pseudoarthrosis   Location:  [facility] OR ROOM 20 / [facility]   Surgeon:  Kary Kos, MD      DISCUSSION: Patient is a 77 year old male scheduled for the above procedure.   History includes former smoker (quit '83), CAD ("mild to moderate nonobstructive 2v" '10), bicuspid aortic valve with ascending aortic aneurysm (s/p AVR, #25 Mosaic porcine valve, and TAA repair 09/09/08 by Dr. Shanon Brow, [facility]), moderate (mean grad 20 mmHg) aortic stenosis and moderately dilated aortic root(11/2015 echo), PAF (post cardiac surgery), hypertension, hyperlipidemia, glaucoma, RA, multiple back surgeries (right L5-S1 laminectomy/foraminotomy 03/08/16; L5-S1 PLIF 10/14/16; exploration/removal of rods L5-S1 and L3-5 posterior lateral arthrodesis 06/08/17). He has moderate pulmonary hypertension by 2016 and 2017 echocardiograms.   Hereported memory loss following his May 2017 surgery (done under MAC anesthesia; midazolam, propofol, lidocaine). He apparently had to be admitted 10/11/15-10/13/15 for altered mental status following surgery. Heelevated Cr with nausea and poor PO intake requiring hydration.  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 He lives in [place].  He had recent cardiology follow-up, although there was mention of repeating his echo sometime this year. Per Lorriane Shire at Dr. Windy Carina office patient will be instructed to hold ASA 5 days prior to surgery. On 09/21/17, Dr. [name] wrote, "Acceptable risk for surgery No further testing needed. He will require preop antibiotics, such as amoxicillin 2000 mg x1." Dr. Donivan Scull staff notified patient and are forwarding recommendations to Dr. Saintclair Halsted. Discussed with anesthesiologist Dr. Lillia Abed. If no acute changes then I anticipate that he can proceed as planned.  VS: BP (!) 138/58   Pulse (!) 55   Temp 36.7 C   Resp 20   Ht 5' 11"  (1.803 m)   Wt 181 lb 12.8 oz (82.5 kg)   SpO2 96%   BMI 25.36 kg/m   PROVIDERS: - PCP is Dr. [name]. - Cardiologist is Dr. Ida Rogue, last visit 06/20/17. He was maintaining SR at that time. EKG did show 2nd degree AV block type 1, but not work-up recommended given he was asymptomatic. Consider repeat echo in 2019 for follow-up AVR with AS and dilated aortic root. - Neurologist is Dr. Gurney Maxin, last visit 11/08/16.  - Rheumatologist is Dr. Percell Boston, last visit 01/10/17. - Hematologist is with [facility]. He was seen last by Nolon Stalls on 12/17/16. He had been seen for asymmetric left rectal wall thickening on CT and pancytopenia. He underwent bone marrow biopsy on 11/04/16 that showed normocellular marrow (20%) with trilineage hematopoiesis and maturation, mild megakaryocytic ayptia, normocytic anemia and leukopenia. Leukocytes morphologically unremarkable. - Nephrologist is Dr. Jac Canavan with [facility]. As of 12/2015, patient was overall classified as CKD stage III with baseline Cr was ~ 1.6-1.7, GFR 42 with fuctuations due to variation in volume status from cardiopulmonary and renal hemodynamics. - Gastroenterologist is Dr. Jonathon Bellows. He had EGD/colonoscopy 09/24/16 for evaluation of iron deficiency anemia. Colonoscopy showed non-bleeding internal hemorrhoids, diverticulosis, 3 mm cecal polyp s/p removal (tubular adenoma, negative for high grade dysplasia and malignancy). EGD showed monilial esophagitis (mild chronic gastritis/healing mucosal injury, negative for H. Pylori, dysplasia, malignancy). - Urologist is  with [facility]. Last visti with Zara Council, PA-C 04/21/17.     LABS: Labs reviewed: Acceptable for surgery. Cr 1.20. H/H 10.5/33.1, stable since 06/06/17.  T&S done. PLT 124K, down from 172 K on 07/11/17.  (all labs ordered are listed, but only abnormal results are displayed)  Labs Reviewed  BASIC METABOLIC PANEL - Abnormal; Notable for the following components:      Result Value   BUN 22 (*)    GFR calc non Af Amer 56 (*)    All other components within normal limits  CBC - Abnormal; Notable for the following components:   RBC 3.74 (*)    Hemoglobin 10.5 (*)    HCT 33.1 (*)    Platelets 124 (*)    All other components within normal limits  SURGICAL PCR SCREEN  TYPE AND SCREEN    IMAGES: 1V CXR 10/15/16: IMPRESSION: Mild left lower lobe atelectasis and scarring. Negative for heart failure  EKG: 06/27/17: SR with first degree AV block. Minimal voltage criteria for LVH, may be normal variant. Cannot rule out anterior infarct, age undetermined.   CV: Echo7/6/17: Study Conclusions - Left ventricle: The cavity size was mildly dilated. Systolic function was normal. The estimated ejection fraction was in the range of 50% to 55%. Wall motion was normal; there were no regional wall motion abnormalities. Features are consistent with a pseudonormal left ventricular filling pattern, with concomitant abnormal relaxation and increased filling pressure (grade 2 diastolic dysfunction). - Aortic valve: A bioprosthesis was present. There was moderate stenosis. Mean gradient (S): 20 mm Hg. Valve area (VTI): 1.24 cm^2. - Aorta: Aortic root dimension: 44 mm (ED). - Aortic root: The aortic root was moderately dilated. - Mitral valve: There was mild regurgitation. - Left atrium: The atrium was moderately dilated. - Right atrium: The atrium was mildly dilated. - Pulmonary arteries: Systolic pressure was moderately increased. PA peak pressure: 53 mm Hg (S). Impressions: - Relatively stable findings since last year except for increased pulmonary pressure from 42 to 53 mm Hg. (Dr. [name] felt findings had not significantly changed in the past year. Right heart  pressure elevated, likely from history of smoking, prosthetic valve, renal dysfunction. Could repeat in one year. As of 05/2017, he would consider a repeat echo sometime in 2019.)  By notes, he had mild to moderate non-obstructive CAD by cath done prior to AVR/TAA repair.   Carotid U/S1/10/17: Impressions: Normal carotid arteries, bilaterally. Patent vertebral arteries with antegrade flow. Normal subclavian arteries, bilaterally.   Past Medical History:  Diagnosis Date  . Abnormal CT scan    Asymmetric left rectal wall thickening   . Allergy   . Anemia   . Arrhythmia   . Chronic lower back pain   . Complication of anesthesia    Memory loss 09/2015  . Coronary artery disease   . GERD (gastroesophageal reflux disease)   . Glaucoma   . H/O thoracic aortic aneurysm repair   . History of being hospitalized    memory lose kidney funtion down blood pressure up  . History of blood transfusion    "think he had one when he had heart valve OR" (10/14/2016)  . History of chicken pox   . Hyperlipidemia   . Hypertension   . Lumbar stenosis   . Neuropathy   . Osteoarthritis    worse in feet and ankles  . Paroxysmal A-fib ([place])   . Poor short term memory    takes Aricept  . Rheumatoid arthritis ([place])    "  all over" (10/14/2016)  . Valvular heart disease     Past Surgical History:  Procedure Laterality Date  . AORTIC VALVE REPLACEMENT  2007   [facility].  [place], [place]  . APPENDECTOMY  2010  . BACK SURGERY    . CARDIAC VALVE REPLACEMENT    . CATARACT EXTRACTION W/ INTRAOCULAR LENS  IMPLANT, BILATERAL Bilateral   . COLONOSCOPY WITH PROPOFOL N/A 09/24/2016   Procedure: COLONOSCOPY WITH PROPOFOL;  Surgeon: Jonathon Bellows, MD;  Location: [facility] ENDOSCOPY;  Service: Endoscopy;  Laterality: N/A;  . ESOPHAGOGASTRODUODENOSCOPY (EGD) WITH PROPOFOL N/A 09/24/2016   Procedure: ESOPHAGOGASTRODUODENOSCOPY (EGD) WITH PROPOFOL;  Surgeon: Jonathon Bellows, MD;  Location: [facility] ENDOSCOPY;  Service: Endoscopy;   Laterality: N/A;  . EYE SURGERY Bilateral 2012   cataract surgery, lens implants  . GIVENS CAPSULE STUDY N/A 09/24/2016   Procedure: GIVENS CAPSULE STUDY;  Surgeon: Jonathon Bellows, MD;  Location: [facility] ENDOSCOPY;  Service: Endoscopy;  Laterality: N/A;  . HAMMER TOE SURGERY Right 10/09/2015   Procedure: HAMMER TOE REPAIR WITH K-WIRE FIXATION RIGHT SECOND TOE;  Surgeon: Albertine Patricia, DPM;  Location: [place];  Service: Podiatry;  Laterality: Right;  WITH LOCAL  . JOINT REPLACEMENT Left 2008   knee  . LAPAROSCOPIC CHOLECYSTECTOMY  2010  . LUMBAR FUSION Right 06/08/2017   LUMBAR THREE-FOUR, LUMBAR FOUR-FIVE POSTEROLATERAL ARTHRODESIS WITH RIGHT LUMBAR FOUR-FIVE LAMINECTOMY/FORAMINOTOMY  . LUMBAR LAMINECTOMY/DECOMPRESSION MICRODISCECTOMY Right 03/08/2016   Procedure: Laminectomy and Foraminotomy - Lumbar Five-Sacral One Right;  Surgeon: Kary Kos, MD;  Location: [place];  Service: Neurosurgery;  Laterality: Right;  Right  . MULTIPLE TOOTH EXTRACTIONS     "3"  . POSTERIOR LUMBAR FUSION  10/14/2016   L5-S1  . REPLACEMENT TOTAL KNEE Left 2003  . THORACIC AORTIC ANEURYSM REPAIR  2010    MEDICATIONS: . amLODipine (NORVASC) 5 MG tablet  . amoxicillin (AMOXIL) 500 MG capsule  . aspirin 325 MG EC tablet  . atorvastatin (LIPITOR) 40 MG tablet  . cetirizine (ZYRTEC) 10 MG tablet  . donepezil (ARICEPT) 10 MG tablet  . fenofibrate 160 MG tablet  . FLUoxetine (PROZAC) 20 MG capsule  . folic acid (FOLVITE) 1 MG tablet  . gabapentin (NEURONTIN) 600 MG tablet  . HYDROcodone-acetaminophen (NORCO) 10-325 MG tablet  . hydrocortisone cream 1 %  . lisinopril (PRINIVIL,ZESTRIL) 20 MG tablet  . omeprazole (PRILOSEC) 40 MG capsule  . polyethylene glycol powder (GLYCOLAX/MIRALAX) powder  . tamsulosin (FLOMAX) 0.4 MG CAPS capsule  . testosterone cypionate (DEPOTESTOSTERONE CYPIONATE) 200 MG/ML injection  . traZODone (DESYREL) 50 MG tablet   No current facility-administered medications for this  encounter.    George [facility] Short Stay Center/Anesthesiology Phone [phone] 09/22/2017 4:24 PM

## 2017-09-21 NOTE — Telephone Encounter (Signed)
Acceptable risk for surgery No further testing needed He will require preop antibiotics, such as amoxicillin 2000 mg x1

## 2017-09-22 NOTE — Telephone Encounter (Signed)
Spoke with patient and reviewed that Dr. [name] provided clearance with instructions to take amoxicillin prior to procedure. He verbalized understanding and advised that this information was sent over to Dr. Windy Carina office. He was appreciative for the call with no further concerns at this time.

## 2017-09-22 NOTE — Telephone Encounter (Signed)
Clearance routed to Dr. Saintclair Halsted

## 2017-09-23 ENCOUNTER — Other Ambulatory Visit: Payer: Self-pay | Admitting: Family Medicine

## 2017-09-27 MED ORDER — VANCOMYCIN HCL 10 G IV SOLR
1500.0000 mg | INTRAVENOUS | Status: AC
  Administered 2017-09-28: 1500 mg via INTRAVENOUS
  Filled 2017-09-27: qty 1500

## 2017-09-28 ENCOUNTER — Other Ambulatory Visit: Payer: Self-pay

## 2017-09-28 ENCOUNTER — Inpatient Hospital Stay (HOSPITAL_COMMUNITY)
Admission: RE | Disposition: A | Discharge status: Skilled Nursing Facility | Payer: Self-pay | Source: Ambulatory Visit | Attending: Neurosurgery

## 2017-09-28 ENCOUNTER — Inpatient Hospital Stay (HOSPITAL_COMMUNITY): Payer: Medicare Other

## 2017-09-28 ENCOUNTER — Inpatient Hospital Stay (HOSPITAL_COMMUNITY): Payer: Medicare Other | Admitting: Certified Registered"

## 2017-09-28 ENCOUNTER — Encounter (HOSPITAL_COMMUNITY): Payer: Self-pay

## 2017-09-28 ENCOUNTER — Inpatient Hospital Stay (HOSPITAL_COMMUNITY)
Admission: RE | Admit: 2017-09-28 | Discharge: 2017-10-01 | DRG: 460 | Disposition: A | Discharge status: Skilled Nursing Facility | Payer: Medicare Other | Source: Ambulatory Visit | Attending: Neurological Surgery | Admitting: Neurological Surgery

## 2017-09-28 ENCOUNTER — Inpatient Hospital Stay (HOSPITAL_COMMUNITY): Payer: Medicare Other | Admitting: Vascular Surgery

## 2017-09-28 DIAGNOSIS — F329 Major depressive disorder, single episode, unspecified: Secondary | ICD-10-CM | POA: Diagnosis not present

## 2017-09-28 DIAGNOSIS — T84038A Mechanical loosening of other internal prosthetic joint, initial encounter: Secondary | ICD-10-CM | POA: Diagnosis present

## 2017-09-28 DIAGNOSIS — Z9049 Acquired absence of other specified parts of digestive tract: Secondary | ICD-10-CM

## 2017-09-28 DIAGNOSIS — I480 Paroxysmal atrial fibrillation: Secondary | ICD-10-CM | POA: Diagnosis present

## 2017-09-28 DIAGNOSIS — Z87891 Personal history of nicotine dependence: Secondary | ICD-10-CM

## 2017-09-28 DIAGNOSIS — Z8249 Family history of ischemic heart disease and other diseases of the circulatory system: Secondary | ICD-10-CM | POA: Diagnosis not present

## 2017-09-28 DIAGNOSIS — Z961 Presence of intraocular lens: Secondary | ICD-10-CM | POA: Diagnosis present

## 2017-09-28 DIAGNOSIS — Z952 Presence of prosthetic heart valve: Secondary | ICD-10-CM

## 2017-09-28 DIAGNOSIS — R338 Other retention of urine: Secondary | ICD-10-CM | POA: Diagnosis not present

## 2017-09-28 DIAGNOSIS — Z825 Family history of asthma and other chronic lower respiratory diseases: Secondary | ICD-10-CM

## 2017-09-28 DIAGNOSIS — G629 Polyneuropathy, unspecified: Secondary | ICD-10-CM | POA: Diagnosis not present

## 2017-09-28 DIAGNOSIS — Z880 Allergy status to penicillin: Secondary | ICD-10-CM

## 2017-09-28 DIAGNOSIS — M19072 Primary osteoarthritis, left ankle and foot: Secondary | ICD-10-CM | POA: Diagnosis not present

## 2017-09-28 DIAGNOSIS — K219 Gastro-esophageal reflux disease without esophagitis: Secondary | ICD-10-CM | POA: Diagnosis present

## 2017-09-28 DIAGNOSIS — Z79899 Other long term (current) drug therapy: Secondary | ICD-10-CM

## 2017-09-28 DIAGNOSIS — N403 Nodular prostate with lower urinary tract symptoms: Secondary | ICD-10-CM | POA: Diagnosis not present

## 2017-09-28 DIAGNOSIS — I10 Essential (primary) hypertension: Secondary | ICD-10-CM | POA: Diagnosis present

## 2017-09-28 DIAGNOSIS — N189 Chronic kidney disease, unspecified: Secondary | ICD-10-CM | POA: Diagnosis not present

## 2017-09-28 DIAGNOSIS — Z981 Arthrodesis status: Secondary | ICD-10-CM | POA: Diagnosis not present

## 2017-09-28 DIAGNOSIS — Z9841 Cataract extraction status, right eye: Secondary | ICD-10-CM

## 2017-09-28 DIAGNOSIS — Y838 Other surgical procedures as the cause of abnormal reaction of the patient, or of later complication, without mention of misadventure at the time of the procedure: Secondary | ICD-10-CM | POA: Diagnosis present

## 2017-09-28 DIAGNOSIS — Z9842 Cataract extraction status, left eye: Secondary | ICD-10-CM

## 2017-09-28 DIAGNOSIS — Z96652 Presence of left artificial knee joint: Secondary | ICD-10-CM | POA: Diagnosis present

## 2017-09-28 DIAGNOSIS — G4700 Insomnia, unspecified: Secondary | ICD-10-CM | POA: Diagnosis not present

## 2017-09-28 DIAGNOSIS — E785 Hyperlipidemia, unspecified: Secondary | ICD-10-CM | POA: Diagnosis not present

## 2017-09-28 DIAGNOSIS — M19071 Primary osteoarthritis, right ankle and foot: Secondary | ICD-10-CM | POA: Diagnosis not present

## 2017-09-28 DIAGNOSIS — S32009K Unspecified fracture of unspecified lumbar vertebra, subsequent encounter for fracture with nonunion: Secondary | ICD-10-CM | POA: Diagnosis present

## 2017-09-28 DIAGNOSIS — I2510 Atherosclerotic heart disease of native coronary artery without angina pectoris: Secondary | ICD-10-CM | POA: Diagnosis not present

## 2017-09-28 DIAGNOSIS — D649 Anemia, unspecified: Secondary | ICD-10-CM | POA: Diagnosis not present

## 2017-09-28 DIAGNOSIS — Z7982 Long term (current) use of aspirin: Secondary | ICD-10-CM | POA: Diagnosis not present

## 2017-09-28 DIAGNOSIS — Z419 Encounter for procedure for purposes other than remedying health state, unspecified: Secondary | ICD-10-CM

## 2017-09-28 DIAGNOSIS — M960 Pseudarthrosis after fusion or arthrodesis: Secondary | ICD-10-CM | POA: Diagnosis not present

## 2017-09-28 DIAGNOSIS — M069 Rheumatoid arthritis, unspecified: Secondary | ICD-10-CM | POA: Diagnosis not present

## 2017-09-28 DIAGNOSIS — H409 Unspecified glaucoma: Secondary | ICD-10-CM | POA: Diagnosis not present

## 2017-09-28 DIAGNOSIS — Z885 Allergy status to narcotic agent status: Secondary | ICD-10-CM | POA: Diagnosis not present

## 2017-09-28 DIAGNOSIS — Z79891 Long term (current) use of opiate analgesic: Secondary | ICD-10-CM

## 2017-09-28 DIAGNOSIS — T84216A Breakdown (mechanical) of internal fixation device of vertebrae, initial encounter: Secondary | ICD-10-CM | POA: Diagnosis not present

## 2017-09-28 DIAGNOSIS — M4326 Fusion of spine, lumbar region: Secondary | ICD-10-CM | POA: Diagnosis not present

## 2017-09-28 DIAGNOSIS — I6529 Occlusion and stenosis of unspecified carotid artery: Secondary | ICD-10-CM | POA: Diagnosis not present

## 2017-09-28 DIAGNOSIS — M1990 Unspecified osteoarthritis, unspecified site: Secondary | ICD-10-CM | POA: Diagnosis not present

## 2017-09-28 DIAGNOSIS — M4328 Fusion of spine, sacral and sacrococcygeal region: Secondary | ICD-10-CM

## 2017-09-28 DIAGNOSIS — I129 Hypertensive chronic kidney disease with stage 1 through stage 4 chronic kidney disease, or unspecified chronic kidney disease: Secondary | ICD-10-CM | POA: Diagnosis not present

## 2017-09-28 DIAGNOSIS — H401190 Primary open-angle glaucoma, unspecified eye, stage unspecified: Secondary | ICD-10-CM | POA: Diagnosis not present

## 2017-09-28 DIAGNOSIS — F0390 Unspecified dementia without behavioral disturbance: Secondary | ICD-10-CM | POA: Diagnosis not present

## 2017-09-28 DIAGNOSIS — E538 Deficiency of other specified B group vitamins: Secondary | ICD-10-CM | POA: Diagnosis not present

## 2017-09-28 DIAGNOSIS — T84296D Other mechanical complication of internal fixation device of vertebrae, subsequent encounter: Secondary | ICD-10-CM | POA: Diagnosis not present

## 2017-09-28 HISTORY — PX: LAMINECTOMY WITH POSTERIOR LATERAL ARTHRODESIS LEVEL 3: SHX6337

## 2017-09-28 SURGERY — POSTERIOR LUMBAR FUSION 1 LEVEL
Anesthesia: General | Site: Back

## 2017-09-28 MED ORDER — PROMETHAZINE HCL 25 MG/ML IJ SOLN: 6.2500 mg | INTRAMUSCULAR | Status: DC | PRN

## 2017-09-28 MED ORDER — ALUM & MAG HYDROXIDE-SIMETH 200-200-20 MG/5ML PO SUSP: 30.0000 mL | Freq: Four times a day (QID) | ORAL | Status: DC | PRN

## 2017-09-28 MED ORDER — LORATADINE 10 MG PO TABS
10.0000 mg | ORAL_TABLET | Freq: Every day | ORAL | Status: DC
  Administered 2017-09-29 – 2017-10-01 (×3): 10 mg via ORAL
  Filled 2017-09-28 (×3): qty 1

## 2017-09-28 MED ORDER — DONEPEZIL HCL 10 MG PO TABS
10.0000 mg | ORAL_TABLET | Freq: Every day | ORAL | Status: DC
  Administered 2017-09-28 – 2017-09-30 (×3): 10 mg via ORAL
  Filled 2017-09-28 (×3): qty 1

## 2017-09-28 MED ORDER — FOLIC ACID 1 MG PO TABS
1.0000 mg | ORAL_TABLET | Freq: Two times a day (BID) | ORAL | Status: DC
  Administered 2017-09-28 – 2017-10-01 (×6): 1 mg via ORAL
  Filled 2017-09-28 (×6): qty 1

## 2017-09-28 MED ORDER — ONDANSETRON HCL 4 MG/2ML IJ SOLN: 4.0000 mg | Freq: Four times a day (QID) | INTRAMUSCULAR | Status: DC | PRN

## 2017-09-28 MED ORDER — GABAPENTIN 600 MG PO TABS
600.0000 mg | ORAL_TABLET | Freq: Three times a day (TID) | ORAL | Status: DC
  Administered 2017-09-28 – 2017-10-01 (×8): 600 mg via ORAL
  Filled 2017-09-28 (×8): qty 1

## 2017-09-28 MED ORDER — HYDROMORPHONE HCL 2 MG/ML IJ SOLN
0.3000 mg | INTRAMUSCULAR | Status: DC | PRN
  Administered 2017-09-28 (×3): 0.5 mg via INTRAVENOUS

## 2017-09-28 MED ORDER — HYDROMORPHONE HCL 1 MG/ML IJ SOLN
1.0000 mg | INTRAMUSCULAR | Status: DC | PRN
  Administered 2017-09-28: 1 mg via INTRAVENOUS
  Filled 2017-09-28: qty 1

## 2017-09-28 MED ORDER — PHENYLEPHRINE HCL 10 MG/ML IJ SOLN
INTRAMUSCULAR | Status: AC
  Filled 2017-09-28: qty 1

## 2017-09-28 MED ORDER — VANCOMYCIN HCL 1 G IV SOLR
INTRAVENOUS | Status: DC | PRN
  Administered 2017-09-28: 1000 mg

## 2017-09-28 MED ORDER — HYDROMORPHONE HCL 2 MG/ML IJ SOLN
INTRAMUSCULAR | Status: AC
  Administered 2017-09-28: 0.5 mg via INTRAVENOUS
  Filled 2017-09-28: qty 1

## 2017-09-28 MED ORDER — LIDOCAINE-EPINEPHRINE 1 %-1:100000 IJ SOLN
INTRAMUSCULAR | Status: DC | PRN
  Administered 2017-09-28: 10 mL

## 2017-09-28 MED ORDER — ROCURONIUM BROMIDE 50 MG/5ML IV SOLN
INTRAVENOUS | Status: AC
  Filled 2017-09-28: qty 1

## 2017-09-28 MED ORDER — PANTOPRAZOLE SODIUM 40 MG PO TBEC: 80.0000 mg | DELAYED_RELEASE_TABLET | Freq: Every day | ORAL | Status: DC

## 2017-09-28 MED ORDER — AMOXICILLIN 500 MG PO CAPS: 2000.0000 mg | ORAL_CAPSULE | ORAL | Status: DC

## 2017-09-28 MED ORDER — PHENOL 1.4 % MT LIQD: 1.0000 | OROMUCOSAL | Status: DC | PRN

## 2017-09-28 MED ORDER — OXYCODONE HCL 5 MG PO TABS
10.0000 mg | ORAL_TABLET | ORAL | Status: DC | PRN
  Administered 2017-09-28 – 2017-10-01 (×12): 10 mg via ORAL
  Filled 2017-09-28 (×12): qty 2

## 2017-09-28 MED ORDER — FENOFIBRATE 160 MG PO TABS
160.0000 mg | ORAL_TABLET | Freq: Every day | ORAL | Status: DC
  Administered 2017-09-28 – 2017-09-30 (×3): 160 mg via ORAL
  Filled 2017-09-28 (×3): qty 1

## 2017-09-28 MED ORDER — THROMBIN (RECOMBINANT) 20000 UNITS EX SOLR
CUTANEOUS | Status: DC | PRN
  Administered 2017-09-28: 12:00:00 via TOPICAL

## 2017-09-28 MED ORDER — LIDOCAINE HCL (CARDIAC) PF 100 MG/5ML IV SOSY
PREFILLED_SYRINGE | INTRAVENOUS | Status: DC | PRN
  Administered 2017-09-28: 100 mg via INTRAVENOUS

## 2017-09-28 MED ORDER — VANCOMYCIN HCL 10 G IV SOLR
1500.0000 mg | Freq: Once | INTRAVENOUS | Status: AC
  Administered 2017-09-29: 1500 mg via INTRAVENOUS
  Filled 2017-09-28: qty 1500

## 2017-09-28 MED ORDER — PROPOFOL 10 MG/ML IV BOLUS
INTRAVENOUS | Status: AC
  Filled 2017-09-28: qty 20

## 2017-09-28 MED ORDER — SODIUM CHLORIDE 0.9% FLUSH: 3.0000 mL | INTRAVENOUS | Status: DC | PRN

## 2017-09-28 MED ORDER — BUPIVACAINE LIPOSOME 1.3 % IJ SUSP
INTRAMUSCULAR | Status: DC | PRN
  Administered 2017-09-28: 20 mL

## 2017-09-28 MED ORDER — ROCURONIUM BROMIDE 100 MG/10ML IV SOLN
INTRAVENOUS | Status: DC | PRN
  Administered 2017-09-28: 50 mg via INTRAVENOUS
  Administered 2017-09-28: 30 mg via INTRAVENOUS

## 2017-09-28 MED ORDER — SODIUM CHLORIDE 0.9 % IV SOLN: 250.0000 mL | INTRAVENOUS | Status: DC

## 2017-09-28 MED ORDER — ONDANSETRON HCL 4 MG/2ML IJ SOLN
INTRAMUSCULAR | Status: AC
  Filled 2017-09-28: qty 2

## 2017-09-28 MED ORDER — SODIUM CHLORIDE 0.9% FLUSH
3.0000 mL | Freq: Two times a day (BID) | INTRAVENOUS | Status: DC
  Administered 2017-09-28 – 2017-09-30 (×4): 3 mL via INTRAVENOUS

## 2017-09-28 MED ORDER — ATORVASTATIN CALCIUM 40 MG PO TABS
40.0000 mg | ORAL_TABLET | Freq: Every day | ORAL | Status: DC
  Administered 2017-09-29 – 2017-09-30 (×2): 40 mg via ORAL
  Filled 2017-09-28 (×2): qty 1

## 2017-09-28 MED ORDER — FENTANYL CITRATE (PF) 250 MCG/5ML IJ SOLN
INTRAMUSCULAR | Status: AC
  Filled 2017-09-28: qty 5

## 2017-09-28 MED ORDER — CEFAZOLIN SODIUM-DEXTROSE 2-4 GM/100ML-% IV SOLN: 2.0000 g | Freq: Three times a day (TID) | INTRAVENOUS | Status: DC

## 2017-09-28 MED ORDER — TAMSULOSIN HCL 0.4 MG PO CAPS
0.4000 mg | ORAL_CAPSULE | Freq: Every day | ORAL | Status: DC
  Administered 2017-09-29 – 2017-10-01 (×3): 0.4 mg via ORAL
  Filled 2017-09-28 (×3): qty 1

## 2017-09-28 MED ORDER — BUPIVACAINE LIPOSOME 1.3 % IJ SUSP
20.0000 mL | Freq: Once | INTRAMUSCULAR | Status: DC
  Filled 2017-09-28: qty 20

## 2017-09-28 MED ORDER — HYDROCODONE-ACETAMINOPHEN 10-325 MG PO TABS
1.0000 | ORAL_TABLET | ORAL | Status: DC | PRN
Start: 2017-09-28 — End: 2017-10-01

## 2017-09-28 MED ORDER — 0.9 % SODIUM CHLORIDE (POUR BTL) OPTIME
TOPICAL | Status: DC | PRN
  Administered 2017-09-28 (×2): 1000 mL

## 2017-09-28 MED ORDER — PHENYLEPHRINE HCL 10 MG/ML IJ SOLN
INTRAVENOUS | Status: DC | PRN
  Administered 2017-09-28: 30 ug/min via INTRAVENOUS

## 2017-09-28 MED ORDER — SUGAMMADEX SODIUM 200 MG/2ML IV SOLN
INTRAVENOUS | Status: AC
Start: 2017-09-28 — End: ?
  Filled 2017-09-28: qty 2

## 2017-09-28 MED ORDER — DEXAMETHASONE SODIUM PHOSPHATE 10 MG/ML IJ SOLN
INTRAMUSCULAR | Status: DC | PRN
  Administered 2017-09-28: 10 mg via INTRAVENOUS

## 2017-09-28 MED ORDER — BUPIVACAINE HCL (PF) 0.25 % IJ SOLN
INTRAMUSCULAR | Status: DC | PRN
  Administered 2017-09-28: 10 mL

## 2017-09-28 MED ORDER — ONDANSETRON HCL 4 MG PO TABS: 4.0000 mg | ORAL_TABLET | Freq: Four times a day (QID) | ORAL | Status: DC | PRN

## 2017-09-28 MED ORDER — AMLODIPINE BESYLATE 5 MG PO TABS
5.0000 mg | ORAL_TABLET | Freq: Every day | ORAL | Status: DC
  Administered 2017-09-29 – 2017-10-01 (×3): 5 mg via ORAL
  Filled 2017-09-28 (×3): qty 1

## 2017-09-28 MED ORDER — THROMBIN 20000 UNITS EX SOLR
CUTANEOUS | Status: AC
  Filled 2017-09-28: qty 20000

## 2017-09-28 MED ORDER — MENTHOL 3 MG MT LOZG: 1.0000 | LOZENGE | OROMUCOSAL | Status: DC | PRN

## 2017-09-28 MED ORDER — LISINOPRIL 20 MG PO TABS
20.0000 mg | ORAL_TABLET | Freq: Every evening | ORAL | Status: DC
  Administered 2017-09-28 – 2017-09-30 (×3): 20 mg via ORAL
  Filled 2017-09-28 (×4): qty 1

## 2017-09-28 MED ORDER — LIDOCAINE-EPINEPHRINE 1 %-1:100000 IJ SOLN
INTRAMUSCULAR | Status: AC
  Filled 2017-09-28: qty 1

## 2017-09-28 MED ORDER — ACETAMINOPHEN 325 MG PO TABS: 650.0000 mg | ORAL_TABLET | ORAL | Status: DC | PRN

## 2017-09-28 MED ORDER — LIDOCAINE 2% (20 MG/ML) 5 ML SYRINGE
INTRAMUSCULAR | Status: AC
  Filled 2017-09-28: qty 5

## 2017-09-28 MED ORDER — VANCOMYCIN HCL 1000 MG IV SOLR
INTRAVENOUS | Status: AC
Start: 2017-09-28 — End: ?
  Filled 2017-09-28: qty 1000

## 2017-09-28 MED ORDER — DEXAMETHASONE SODIUM PHOSPHATE 10 MG/ML IJ SOLN
INTRAMUSCULAR | Status: AC
Start: 2017-09-28 — End: ?
  Filled 2017-09-28: qty 1

## 2017-09-28 MED ORDER — HYDROMORPHONE HCL 2 MG/ML IJ SOLN: 0.3000 mg | INTRAMUSCULAR | Status: DC | PRN

## 2017-09-28 MED ORDER — ACETAMINOPHEN 650 MG RE SUPP: 650.0000 mg | RECTAL | Status: DC | PRN

## 2017-09-28 MED ORDER — FENTANYL CITRATE (PF) 100 MCG/2ML IJ SOLN
INTRAMUSCULAR | Status: DC | PRN
  Administered 2017-09-28 (×8): 50 ug via INTRAVENOUS

## 2017-09-28 MED ORDER — PANTOPRAZOLE SODIUM 40 MG PO TBEC
40.0000 mg | DELAYED_RELEASE_TABLET | Freq: Every day | ORAL | Status: DC
  Administered 2017-09-29 – 2017-10-01 (×3): 40 mg via ORAL
  Filled 2017-09-28 (×4): qty 1

## 2017-09-28 MED ORDER — BUPIVACAINE HCL (PF) 0.25 % IJ SOLN
INTRAMUSCULAR | Status: AC
  Filled 2017-09-28: qty 30

## 2017-09-28 MED ORDER — EPHEDRINE SULFATE 50 MG/ML IJ SOLN
INTRAMUSCULAR | Status: DC | PRN
  Administered 2017-09-28 (×3): 10 mg via INTRAVENOUS

## 2017-09-28 MED ORDER — ONDANSETRON HCL 4 MG/2ML IJ SOLN
INTRAMUSCULAR | Status: DC | PRN
  Administered 2017-09-28: 4 mg via INTRAVENOUS

## 2017-09-28 MED ORDER — SODIUM CHLORIDE 0.9 % IV SOLN
INTRAVENOUS | Status: DC | PRN
  Administered 2017-09-28: 12:00:00

## 2017-09-28 MED ORDER — TRAZODONE HCL 100 MG PO TABS
100.0000 mg | ORAL_TABLET | Freq: Every day | ORAL | Status: DC
  Administered 2017-09-28 – 2017-09-30 (×3): 100 mg via ORAL
  Filled 2017-09-28 (×3): qty 1

## 2017-09-28 MED ORDER — CYCLOBENZAPRINE HCL 10 MG PO TABS
10.0000 mg | ORAL_TABLET | Freq: Three times a day (TID) | ORAL | Status: DC | PRN
  Administered 2017-09-28 – 2017-10-01 (×4): 10 mg via ORAL
  Filled 2017-09-28 (×4): qty 1

## 2017-09-28 MED ORDER — SUGAMMADEX SODIUM 200 MG/2ML IV SOLN
INTRAVENOUS | Status: DC | PRN
  Administered 2017-09-28: 170 mg via INTRAVENOUS

## 2017-09-28 MED ORDER — FLUOXETINE HCL 20 MG PO CAPS
20.0000 mg | ORAL_CAPSULE | Freq: Every day | ORAL | Status: DC
  Administered 2017-09-29 – 2017-10-01 (×3): 20 mg via ORAL
  Filled 2017-09-28 (×3): qty 1

## 2017-09-28 MED ORDER — LACTATED RINGERS IV SOLN
INTRAVENOUS | Status: DC
  Administered 2017-09-28 (×2): via INTRAVENOUS

## 2017-09-28 MED ORDER — ASPIRIN EC 325 MG PO TBEC
325.0000 mg | DELAYED_RELEASE_TABLET | Freq: Every day | ORAL | Status: DC
  Administered 2017-09-28 – 2017-09-30 (×3): 325 mg via ORAL
  Filled 2017-09-28 (×3): qty 1

## 2017-09-28 MED ORDER — PROPOFOL 10 MG/ML IV BOLUS
INTRAVENOUS | Status: DC | PRN
  Administered 2017-09-28: 120 mg via INTRAVENOUS

## 2017-09-28 SURGICAL SUPPLY — 73 items
BAG DECANTER FOR FLEXI CONT (MISCELLANEOUS) ×3 IMPLANT
BENZOIN TINCTURE PRP APPL 2/3 (GAUZE/BANDAGES/DRESSINGS) ×3 IMPLANT
BLADE CLIPPER SURG (BLADE) IMPLANT
BLADE SURG 11 STRL SS (BLADE) ×3 IMPLANT
BONE VIVIGEN FORMABLE 10CC (Bone Implant) ×3 IMPLANT
BUR CUTTER 7.0 ROUND (BURR) ×3 IMPLANT
BUR MATCHSTICK NEURO 3.0 LAGG (BURR) ×3 IMPLANT
CANISTER SUCT 3000ML PPV (MISCELLANEOUS) ×3 IMPLANT
CAP LOCKING THREADED (Cap) ×27 IMPLANT
CARTRIDGE OIL MAESTRO DRILL (MISCELLANEOUS) ×2 IMPLANT
CONT SPEC 4OZ CLIKSEAL STRL BL (MISCELLANEOUS) ×3 IMPLANT
COVER BACK TABLE 60X90IN (DRAPES) ×3 IMPLANT
DECANTER SPIKE VIAL GLASS SM (MISCELLANEOUS) ×3 IMPLANT
DERMABOND ADVANCED (GAUZE/BANDAGES/DRESSINGS) ×1
DERMABOND ADVANCED .7 DNX12 (GAUZE/BANDAGES/DRESSINGS) ×2 IMPLANT
DIFFUSER DRILL AIR PNEUMATIC (MISCELLANEOUS) ×3 IMPLANT
DRAPE C-ARM 42X72 X-RAY (DRAPES) ×6 IMPLANT
DRAPE C-ARMOR (DRAPES) ×3 IMPLANT
DRAPE HALF SHEET 40X57 (DRAPES) IMPLANT
DRAPE LAPAROTOMY 100X72X124 (DRAPES) ×3 IMPLANT
DRAPE SURG 17X23 STRL (DRAPES) ×3 IMPLANT
DRSG OPSITE 4X5.5 SM (GAUZE/BANDAGES/DRESSINGS) ×3 IMPLANT
DRSG OPSITE POSTOP 4X10 (GAUZE/BANDAGES/DRESSINGS) ×3 IMPLANT
DRSG OPSITE POSTOP 4X6 (GAUZE/BANDAGES/DRESSINGS) ×3 IMPLANT
DURAPREP 26ML APPLICATOR (WOUND CARE) ×3 IMPLANT
ELECT REM PT RETURN 9FT ADLT (ELECTROSURGICAL) ×3
ELECTRODE REM PT RTRN 9FT ADLT (ELECTROSURGICAL) ×2 IMPLANT
EVACUATOR 3/16  PVC DRAIN (DRAIN) ×1
EVACUATOR 3/16 PVC DRAIN (DRAIN) ×2 IMPLANT
FIBER BOAT ALLOFUSE 15CC (Bone Implant) ×6 IMPLANT
GAUZE SPONGE 4X4 12PLY STRL (GAUZE/BANDAGES/DRESSINGS) ×3 IMPLANT
GAUZE SPONGE 4X4 16PLY XRAY LF (GAUZE/BANDAGES/DRESSINGS) IMPLANT
GLOVE BIO SURGEON STRL SZ7 (GLOVE) ×3 IMPLANT
GLOVE BIO SURGEON STRL SZ8 (GLOVE) ×9 IMPLANT
GLOVE BIO SURGEON STRL SZ8.5 (GLOVE) ×3 IMPLANT
GLOVE BIOGEL PI IND STRL 7.0 (GLOVE) IMPLANT
GLOVE BIOGEL PI INDICATOR 7.0 (GLOVE)
GLOVE ECLIPSE 7.5 STRL STRAW (GLOVE) IMPLANT
GLOVE EXAM NITRILE LRG STRL (GLOVE) IMPLANT
GLOVE EXAM NITRILE XL STR (GLOVE) IMPLANT
GLOVE EXAM NITRILE XS STR PU (GLOVE) IMPLANT
GLOVE INDICATOR 7.0 STRL GRN (GLOVE) ×9 IMPLANT
GLOVE INDICATOR 8.5 STRL (GLOVE) ×6 IMPLANT
GLOVE SURG SS PI 7.0 STRL IVOR (GLOVE) ×3 IMPLANT
GOWN STRL REUS W/ TWL LRG LVL3 (GOWN DISPOSABLE) ×2 IMPLANT
GOWN STRL REUS W/ TWL XL LVL3 (GOWN DISPOSABLE) ×8 IMPLANT
GOWN STRL REUS W/TWL 2XL LVL3 (GOWN DISPOSABLE) IMPLANT
GOWN STRL REUS W/TWL LRG LVL3 (GOWN DISPOSABLE) ×1
GOWN STRL REUS W/TWL XL LVL3 (GOWN DISPOSABLE) ×4
HEMOSTAT POWDER KIT SURGIFOAM (HEMOSTASIS) IMPLANT
KIT BASIN OR (CUSTOM PROCEDURE TRAY) ×3 IMPLANT
KIT INFUSE SMALL (Orthopedic Implant) ×3 IMPLANT
KIT TURNOVER KIT B (KITS) ×3 IMPLANT
MILL MEDIUM DISP (BLADE) ×3 IMPLANT
NEEDLE HYPO 25X1 1.5 SAFETY (NEEDLE) ×3 IMPLANT
NS IRRIG 1000ML POUR BTL (IV SOLUTION) ×6 IMPLANT
OIL CARTRIDGE MAESTRO DRILL (MISCELLANEOUS) ×3
PACK LAMINECTOMY NEURO (CUSTOM PROCEDURE TRAY) ×3 IMPLANT
PAD ARMBOARD 7.5X6 YLW CONV (MISCELLANEOUS) ×9 IMPLANT
ROD HEXENDED ALLOY 5.5X500MM (Rod) ×3 IMPLANT
SCREW CREO THREADED 6.5X45 (Screw) ×12 IMPLANT
SPONGE LAP 4X18 X RAY DECT (DISPOSABLE) ×3 IMPLANT
SPONGE SURGIFOAM ABS GEL 100 (HEMOSTASIS) ×3 IMPLANT
STRIP CLOSURE SKIN 1/2X4 (GAUZE/BANDAGES/DRESSINGS) ×3 IMPLANT
SUT VIC AB 0 CT1 18XCR BRD8 (SUTURE) ×4 IMPLANT
SUT VIC AB 0 CT1 8-18 (SUTURE) ×2
SUT VIC AB 2-0 CT1 18 (SUTURE) ×6 IMPLANT
SUT VIC AB 4-0 PS2 27 (SUTURE) ×3 IMPLANT
TOWEL GREEN STERILE (TOWEL DISPOSABLE) ×3 IMPLANT
TOWEL GREEN STERILE FF (TOWEL DISPOSABLE) ×3 IMPLANT
TRAY FOLEY MTR SLVR 16FR STAT (SET/KITS/TRAYS/PACK) ×3 IMPLANT
TULIP CREP AMP 5.5MM (Orthopedic Implant) IMPLANT
WATER STERILE IRR 1000ML POUR (IV SOLUTION) ×3 IMPLANT

## 2017-09-28 NOTE — Anesthesia Preprocedure Evaluation (Signed)
Anesthesia Evaluation  Patient identified by MRN, date of birth, ID band Patient awake    Reviewed: Allergy & Precautions, NPO status , Patient's Chart, lab work & pertinent test results  History of Anesthesia Complications Negative for: history of anesthetic complications  Airway Mallampati: II  TM Distance: >3 FB Neck ROM: Full    Dental no notable dental hx. (+) Dental Advisory Given   Pulmonary former smoker,    breath sounds clear to auscultation       Cardiovascular hypertension, Pt. on medications (-) angina+ CAD and + Peripheral Vascular Disease (s/p AVR, thoracic aneurysm repair)  + dysrhythmias Atrial Fibrillation + Valvular Problems/Murmurs (s/p AVR (bicuspid valve))  Rhythm:Regular Rate:Normal  '17 ECHO: EF 50-55%, moderate aortic stenosis. Mean gradient (S): 20 mm Hg. Valve area (VTI): 1.24 cm^2, mod pulm HTN '09 cardiac cath: mild to moderate CAD   Neuro/Psych Anxiety Chronic back pain    GI/Hepatic Neg liver ROS, GERD  Medicated and Controlled,  Endo/Other  negative endocrine ROS  Renal/GU Renal InsufficiencyRenal disease     Musculoskeletal  (+) Arthritis , Rheumatoid disorders,    Abdominal   Peds  Hematology  (+) Blood dyscrasia (h/o thrombocytopenia: plt 123k), anemia ,   Anesthesia Other Findings   Reproductive/Obstetrics                             Anesthesia Physical  Anesthesia Plan  ASA: III  Anesthesia Plan: General   Post-op Pain Management:    Induction: Intravenous  PONV Risk Score and Plan: 3 and Ondansetron, Dexamethasone, Treatment may vary due to age or medical condition and Diphenhydramine  Airway Management Planned: Oral ETT  Additional Equipment:   Intra-op Plan:   Post-operative Plan: Extubation in OR  Informed Consent: I have reviewed the patients History and Physical, chart, labs and discussed the procedure including the risks, benefits  and alternatives for the proposed anesthesia with the patient or authorized representative who has indicated his/her understanding and acceptance.   Dental advisory given  Plan Discussed with: CRNA and Surgeon  Anesthesia Plan Comments:         Anesthesia Quick Evaluation

## 2017-09-28 NOTE — Transfer of Care (Signed)
Immediate Anesthesia Transfer of Care Note  Patient: Bryan [name]  Procedure(s) Performed: Lumbar one Revision and extension of hardware (N/A Back) LUMBAR  POSTEROR  FUSION REVISION - LUMBAR ONE -TWO, LUMBAR TWO -THREE,THREE-FOUR ,BILATERAL ARTHRODESIS REMOVAL LUMBAR THREE HARDWARE (N/A )  Patient Location: PACU  Anesthesia Type:General  Level of Consciousness: awake and patient cooperative  Airway & Oxygen Therapy: Patient Spontanous Breathing  Post-op Assessment: Report given to RN and Post -op Vital signs reviewed and stable  Post vital signs: Reviewed and stable  Last Vitals:  Vitals Value Taken Time  BP 138/63 09/28/2017  3:05 PM  Temp    Pulse 70 09/28/2017  3:06 PM  Resp 21 09/28/2017  3:06 PM  SpO2 100 % 09/28/2017  3:06 PM  Vitals shown include unvalidated device data.  Last Pain:  Vitals:   09/28/17 1028  TempSrc: Oral         Complications: No apparent anesthesia complications

## 2017-09-28 NOTE — Anesthesia Postprocedure Evaluation (Signed)
Anesthesia Post Note  Patient: KAMRIN SIBLEY  Procedure(s) Performed: Lumbar one Revision and extension of hardware (N/A Back) LUMBAR  POSTEROR  FUSION REVISION - LUMBAR ONE -TWO, LUMBAR TWO -THREE,THREE-FOUR ,BILATERAL ARTHRODESIS REMOVAL LUMBAR THREE HARDWARE (N/A )     Patient location during evaluation: PACU Anesthesia Type: General Level of consciousness: sedated Pain management: pain level controlled Vital Signs Assessment: post-procedure vital signs reviewed and stable Respiratory status: spontaneous breathing and respiratory function stable Cardiovascular status: stable Postop Assessment: no apparent nausea or vomiting Anesthetic complications: no    Last Vitals:  Vitals:   09/28/17 1605 09/28/17 1620  BP: (!) 143/60 (!) 152/71  Pulse: (!) 59 60  Resp: 12 14  Temp:    SpO2: 96% 100%    Last Pain:  Vitals:   09/28/17 1605  TempSrc:   PainSc: Asleep                 Endrit Gittins DANIEL

## 2017-09-28 NOTE — H&P (Signed)
Bryan [name] is an 77 y.o. male.   Chief Complaint: back and bilateral hip pain HPI: 77 year old gentleman presented on L5-S1 fusion and sustained an L4 burst deformity we tried kyphoplasty the patient persistent pain with subsequent had to pedicle screw fixation from L3 to his previous construct at L5-S1. Initially did very well however start expressing we'll progress worsening pain workup revealed loosening of his L3 screws and backing out of his L3 screws. So due to his failure of fusion failure conservative treatment imaging findings I recommended extension up to L1 with placement of bilateral L1-L2 pedicle screws and posterior lateral arthrodesis. I've extensively gone over the risks and benefits of that operation with him as well as perioperative course expectations of outcome and alternatives of surgery and he understands and agrees to proceed forward.  Past Medical History:  Diagnosis Date  . Abnormal CT scan    Asymmetric left rectal wall thickening   . Allergy   . Anemia   . Arrhythmia   . Chronic lower back pain   . Complication of anesthesia    Memory loss 09/2015  . Coronary artery disease   . GERD (gastroesophageal reflux disease)   . Glaucoma   . H/O thoracic aortic aneurysm repair   . History of being hospitalized    memory lose kidney funtion down blood pressure up  . History of blood transfusion    "think he had one when he had heart valve OR" (10/14/2016)  . History of chicken pox   . Hyperlipidemia   . Hypertension   . Lumbar stenosis   . Neuropathy   . Osteoarthritis    worse in feet and ankles  . Paroxysmal A-fib ([place])   . Poor short term memory    takes Aricept  . Rheumatoid arthritis ([place])    "all over" (10/14/2016)  . Valvular heart disease     Past Surgical History:  Procedure Laterality Date  . AORTIC VALVE REPLACEMENT  2007   [facility].  [place], [place]  . APPENDECTOMY  2010  . BACK SURGERY    . CARDIAC VALVE REPLACEMENT    . CATARACT  EXTRACTION W/ INTRAOCULAR LENS  IMPLANT, BILATERAL Bilateral   . COLONOSCOPY WITH PROPOFOL N/A 09/24/2016   Procedure: COLONOSCOPY WITH PROPOFOL;  Surgeon: Jonathon Bellows, MD;  Location: [facility] ENDOSCOPY;  Service: Endoscopy;  Laterality: N/A;  . ESOPHAGOGASTRODUODENOSCOPY (EGD) WITH PROPOFOL N/A 09/24/2016   Procedure: ESOPHAGOGASTRODUODENOSCOPY (EGD) WITH PROPOFOL;  Surgeon: Jonathon Bellows, MD;  Location: [facility] ENDOSCOPY;  Service: Endoscopy;  Laterality: N/A;  . EYE SURGERY Bilateral 2012   cataract surgery, lens implants  . GIVENS CAPSULE STUDY N/A 09/24/2016   Procedure: GIVENS CAPSULE STUDY;  Surgeon: Jonathon Bellows, MD;  Location: [facility] ENDOSCOPY;  Service: Endoscopy;  Laterality: N/A;  . HAMMER TOE SURGERY Right 10/09/2015   Procedure: HAMMER TOE REPAIR WITH K-WIRE FIXATION RIGHT SECOND TOE;  Surgeon: Albertine Patricia, DPM;  Location: [place];  Service: Podiatry;  Laterality: Right;  WITH LOCAL  . JOINT REPLACEMENT Left 2008   knee  . LAPAROSCOPIC CHOLECYSTECTOMY  2010  . LUMBAR FUSION Right 06/08/2017   LUMBAR THREE-FOUR, LUMBAR FOUR-FIVE POSTEROLATERAL ARTHRODESIS WITH RIGHT LUMBAR FOUR-FIVE LAMINECTOMY/FORAMINOTOMY  . LUMBAR LAMINECTOMY/DECOMPRESSION MICRODISCECTOMY Right 03/08/2016   Procedure: Laminectomy and Foraminotomy - Lumbar Five-Sacral One Right;  Surgeon: Kary Kos, MD;  Location: [place];  Service: Neurosurgery;  Laterality: Right;  Right  . MULTIPLE TOOTH EXTRACTIONS     "3"  . POSTERIOR LUMBAR FUSION  10/14/2016  L5-S1  . REPLACEMENT TOTAL KNEE Left 2003  . THORACIC AORTIC ANEURYSM REPAIR  2010    Family History  Problem Relation Age of Onset  . Heart failure Mother   . Hypertension Mother   . Asthma Mother   . Heart attack Father 77       MI  . Hypertension Sister   . Prostate cancer Neg Hx   . Bladder Cancer Neg Hx   . Kidney cancer Neg Hx    Social History:  reports that he quit smoking about 36 years ago. His smoking use included cigarettes. He has a 32.00  pack-year smoking history. He has never used smokeless tobacco. He reports that he drinks alcohol. He reports that he does not use drugs.  Allergies:  Allergies  Allergen Reactions  . Penicillins Hives, Swelling and Other (See Comments)    SWELLING REACTION UNSPECIFIED PATIENT HAS TAKEN AMOXICILLIN ON MED HX FROM [facility] PATIENT HAS HAD A PCN REACTION WITH IMMEDIATE RASH, FACIAL/TONGUE/THROAT SWELLING, SOB, OR LIGHTHEADEDNESS WITH HYPOTENSION:  #  #  #  YES  #  #  #   Has patient had a PCN reaction causing severe rash involving mucus membranes or skin necrosis: No Has patient had a PCN reaction that required hospitalization No Has patient had a PCN reaction occurring within the last 10 years: No  . Demerol [Meperidine] Hives and Nausea And Vomiting    Medications Prior to Admission  Medication Sig Dispense Refill  . amLODipine (NORVASC) 5 MG tablet TAKE ONE TABLET BY MOUTH EVERY DAY 90 tablet 1  . aspirin 325 MG EC tablet Take 325 mg by mouth daily at 2 PM.     . atorvastatin (LIPITOR) 40 MG tablet Take 1 tablet (40 mg total) by mouth daily. (Patient taking differently: Take 40 mg by mouth at bedtime. ) 90 tablet 3  . cetirizine (ZYRTEC) 10 MG tablet Take 10 mg by mouth daily.    Marland Kitchen donepezil (ARICEPT) 10 MG tablet Take 10 mg by mouth at bedtime.    . fenofibrate 160 MG tablet TAKE ONE TABLET BY MOUTH EVERY DAY (Patient taking differently: TAKE ONE TABLET BY MOUTH EVERY DAY AT BEDTIME) 30 tablet 3  . FLUoxetine (PROZAC) 20 MG capsule TAKE 1 CAPSULE BY MOUTH EVERY DAY 90 capsule 1  . folic acid (FOLVITE) 1 MG tablet Take 1 mg by mouth 2 (two) times daily.    Marland Kitchen gabapentin (NEURONTIN) 600 MG tablet Take 600 mg by mouth 3 (three) times daily.    Marland Kitchen HYDROcodone-acetaminophen (NORCO) 10-325 MG tablet Take 1 tablet by mouth every 4 (four) hours as needed for moderate pain. 30 tablet 0  . hydrocortisone cream 1 % Apply 1 application topically daily as needed for itching.    Marland Kitchen lisinopril  (PRINIVIL,ZESTRIL) 20 MG tablet TAKE ONE TABLET BY MOUTH EVERY EVENING 90 tablet 3  . omeprazole (PRILOSEC) 40 MG capsule TAKE 1 CAPSULE BY MOUTH DAILY (Patient taking differently: TAKE 1 CAPSULE BY MOUTH DAILY WITH SUPPER) 30 capsule 3  . tamsulosin (FLOMAX) 0.4 MG CAPS capsule TAKE 1 CAPSULE BY MOUTH DAILY (Patient taking differently: TAKE 1 CAPSULE BY MOUTH DAILY AT SUPPER) 90 capsule 0  . testosterone cypionate (DEPOTESTOSTERONE CYPIONATE) 200 MG/ML injection INJECT 33mL INTO THE MUSCLE EVERY 14 DAYS 10 mL 0  . traZODone (DESYREL) 50 MG tablet Take 100 mg by mouth at bedtime.     Marland Kitchen amoxicillin (AMOXIL) 500 MG capsule Take 2,000 mg by mouth See admin instructions. Take 4 Capsules  prior to dental appointment    . polyethylene glycol powder (GLYCOLAX/MIRALAX) powder USE 1 CAPFUL IN 4 TO 8 OUNCES OF LIQUID DAILY (Patient not taking: Reported on 09/14/2017) 527 g 0    No results found for this or any previous visit (from the past 48 hour(s)). No results found.  Review of Systems  Musculoskeletal: Positive for back pain and joint pain.    Blood pressure (!) 110/54, pulse (!) 55, temperature 97.9 F (36.6 C), temperature source Oral, resp. rate (!) 1, SpO2 96 %. Physical Exam  Constitutional: He is oriented to person, place, and time. He appears well-developed and well-nourished.  HENT:  Head: Normocephalic.  Eyes: Pupils are equal, round, and reactive to light.  Neck: Normal range of motion.  Respiratory: Effort normal and breath sounds normal.  GI: Soft. Bowel sounds are normal.  Musculoskeletal: Normal range of motion.  Neurological: He is alert and oriented to person, place, and time. He has normal strength. GCS eye subscore is 4. GCS verbal subscore is 5. GCS motor subscore is 6.  Strength 5 out of 5 iliopsoas, quads, hamstrings, gastric, and tibialis, and EHL.  Skin: Skin is warm and dry.     Assessment/Plan 77 year old presents for an L1-L4 revision of fusion.  Casyn Becvar P,  MD 09/28/2017, 11:25 AM

## 2017-09-28 NOTE — Op Note (Signed)
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  #3 posterior lateral arthrodesis L1-L5 utilizing vivigen, BMP and cortical fiber boats   Surgeon: Dominica Severin Lory Nowaczyk  Asst.: Newman Pies  Anesthesia: Gen.  EBL: 500  History of present illness: 77 year old gentleman who presented on L5-S1 fusion and subsequent L3-L5 extension when he sustained an L4 burst fracture. 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 I extensively went over the risks and benefits of that operation with him as well as perioperative course expectations of outcome and alternatives of surgery and he understood and agreed to proceed forward.  Operative procedure: Patient brought into the or was induced on general anesthesia positioned prone on the Wilson frame his back was prepped and draped in routine sterile fashion. His old incision was opened up and extended cephalad subperiosteal dissections care lamina of L1-L2 along to the TPs at L1 and L2 bilaterally and then exposed the hardware from L3-S1. I disconnected the hardware from L3-S1 the L3 screws were markedly loose they were easily pulled out I then dissected out and exposed the TPs L1-L2 the TPs at L3-L4 and L5 bilaterally. The posterior lateral bone on the patient's right side did appear to be coalescing and looked like it is progressing to a solid fusion however the  posterior lateral bone on the left side was not anchored to the patient's native TPs or facet complexes at all. So under AP and lateral fluoroscopy pilot pilot holes were drilled cannulated probed tapped probed again and 6.5 x 45 mm hydroxyapatite globus Creole amp pedicle screws were placed bilaterally at L1 and L2. Then posterior fluoroscopy confirmed good position of all the implants I then copious irrigated the wound aggressively decorticated the TPs and lateral gutters from L1-L5 packed BMP, vivigen and the cortical fiber boats posterior laterally from L1 L5 then cut 2 rods and anchored in place from the screws from L1 down to S1 replaced all the top tightening nuts and tightened down in situ. Then inspected the construct felt to be solid spindle vancomycin powder in the wound injected Exparel in the fascia and placed a large Hemovac drain. Then closed the wound with interrupted Vicryls and a running 4 subcuticular Dermabond benzo and Steri-Strips and a sterile dressing was applied patient recovered in stable condition. At the end of case on it counts sponge counts were correct.

## 2017-09-28 NOTE — Anesthesia Procedure Notes (Signed)
Procedure Name: Intubation Date/Time: 09/28/2017 11:57 AM Performed by: Renato Shin, CRNA Pre-anesthesia Checklist: Patient identified, Suction available, Patient being monitored and Emergency Drugs available Patient Re-evaluated:Patient Re-evaluated prior to induction Oxygen Delivery Method: Circle system utilized Preoxygenation: Pre-oxygenation with 100% oxygen Induction Type: IV induction Ventilation: Oral airway inserted - appropriate to patient size and Mask ventilation without difficulty Laryngoscope Size: Miller and 3 Grade View: Grade I Tube type: Oral Tube size: 7.5 mm Airway Equipment and Method: Stylet Placement Confirmation: ETT inserted through vocal cords under direct vision,  positive ETCO2,  CO2 detector and breath sounds checked- equal and bilateral Secured at: 23 cm Tube secured with: Tape Dental Injury: Teeth and Oropharynx as per pre-operative assessment

## 2017-09-29 MED FILL — Thrombin For Soln 20000 Unit: CUTANEOUS | Qty: 1 | Status: AC

## 2017-09-29 NOTE — Progress Notes (Signed)
NEUROSURGERY PROGRESS NOTE  Doing well. Complains of appropriate back soreness. No leg pain No numbness, tingling or weakness Good strength and sensation Incision CDI  Temp:  [97.5 F (36.4 C)-98.7 F (37.1 C)] 98.7 F (37.1 C) (05/09 0325) Pulse Rate:  [55-85] 65 (05/09 0325) Resp:  [1-15] 15 (05/08 1635) BP: (99-175)/(54-75) 126/66 (05/09 0325) SpO2:  [93 %-100 %] 93 % (05/09 0325) Weight:  [185 lb 6.5 oz (84.1 kg)] 185 lb 6.5 oz (84.1 kg) (05/08 1700)  Plan: Ambulate today, pain control  Eleonore Chiquito, NP 09/29/2017 7:11 AM

## 2017-09-29 NOTE — NC FL2 (Signed)
[place] MEDICAID FL2 LEVEL OF CARE SCREENING TOOL     IDENTIFICATION  Patient Name: Bryan [name] Birthdate: 1940/12/27 Sex: male Admission Date (Current Location): 09/28/2017  [facility] and [place] Number:  Engineering geologist and Address:  [facility] [place] [place] [address] [place], [place] [address]      Provider Number: [id number]  Attending Physician Name and Address:  Kary Kos, MD  Relative Name and Phone Number:  Neeraj Housand, wife, [phone]    Current Level of Care: Hospital Recommended Level of Care: [place] Prior Approval Number:    Date Approved/Denied:   PASRR Number: [id number]  Discharge Plan: SNF    Current Diagnoses: Patient Active Problem List   Diagnosis Date Noted  . Pseudoarthrosis of lumbar spine 09/28/2017  . Anxiety 06/24/2017  . Lumbar burst fracture, closed, initial encounter ([place]) 06/08/2017  . Conjunctivitis, bacterial 03/22/2017  . Urge incontinence 12/15/2016  . Ataxia 11/09/2016  . Insomnia 11/09/2016  . Transient memory loss 11/09/2016  . Nausea and vomiting 10/15/2016  . Spinal stenosis of lumbar region 10/14/2016  . Leukopenia 09/27/2016  . Thrombocytopenia ([place]) 09/27/2016  . Other pancytopenia ([place]) 09/27/2016  . History of left knee replacement 09/01/2016  . Fall 09/01/2016  . HNP (herniated nucleus pulposus with myelopathy), thoracic 03/08/2016  . Rectal nodule 02/23/2016  . DDD (degenerative disc disease), lumbar 01/02/2016  . Lumbar radiculopathy 01/02/2016  . Facet syndrome, lumbar 01/02/2016  . Greater trochanteric bursitis 11/28/2015  . Acute renal failure ([place]) 11/06/2015  . Dehydration 10/17/2015  . IBS (irritable bowel syndrome) 07/15/2015  . Erectile dysfunction of organic origin 07/15/2015  . Carotid stenosis 05/06/2015  . Difficulty sleeping 04/15/2015  . 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 Chronic kidney disease 02/17/2015  . Hereditary and idiopathic  neuropathy 02/17/2015  . 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  . Enlarged prostate with lower urinary tract symptoms (LUTS) 02/17/2015  . Rheumatoid arthritis ([place]) 10/01/2014  . Sacroiliac joint disease 10/01/2014  . Fusion of spine, sacral and sacrococcygeal region 10/01/2014  . DDD (degenerative disc disease), cervical 10/01/2014  . Lumbar and sacral osteoarthritis 10/01/2014  . Mechanical and motor problems with internal organs 06/20/2014  . Iron deficiency anemia 06/19/2014  . Anemia 06/19/2014  . Aneurysm, ascending aorta ([place]) 05/27/2014  . Aneurysm of thoracic aorta ([place]) 05/27/2014  . Thoracic aortic aneurysm without rupture ([place]) 05/27/2014  . Cognitive decline 04/01/2014  . Spinal stenosis of lumbar region with radiculopathy 01/30/2014  . Vitamin D deficiency 01/30/2014  . Dorsalgia, unspecified 01/30/2014  . Lumbar canal stenosis 01/30/2014  . Paroxysmal atrial fibrillation ([place]) 11/27/2013  . S/P AVR (aortic valve replacement) 11/27/2013  . History of ascending aorta repair 11/27/2013  . Hyperlipidemia 11/27/2013  . Essential hypertension 11/27/2013  . Bicuspid aortic valve 11/27/2013  . Atherosclerotic heart disease of native coronary artery without angina pectoris 11/27/2013  . 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 History of open heart surgery 11/27/2013  . Carotid artery insufficiency syndrome 11/06/2010    Orientation RESPIRATION BLADDER Height & Weight     Time, Self, Place, Situation  Normal Continent Weight: 185 lb 6.5 oz (84.1 kg) Height:  6' (182.9 cm)  BEHAVIORAL SYMPTOMS/MOOD NEUROLOGICAL BOWEL NUTRITION STATUS      Continent Diet(see discharge summary)  AMBULATORY STATUS COMMUNICATION OF NEEDS Skin   Supervision Verbally Surgical wounds, Other (Comment)- incision on back with honeycomb dressing; hemovac back drain tube #1  Personal Care Assistance Level of Assistance  Bathing, Feeding, Dressing Bathing  Assistance: Limited assistance Feeding assistance: Independent Dressing Assistance: Limited assistance     Functional Limitations Info  Sight, Hearing, Speech Sight Info: Adequate Hearing Info: Adequate Speech Info: Adequate    SPECIAL CARE FACTORS FREQUENCY  PT (By licensed PT), OT (By licensed OT)     PT Frequency: 5x week OT Frequency: 5x week            Contractures Contractures Info: Not present    Additional Factors Info  Code Status, Allergies, Psychotropic Code Status Info: Full Code Allergies Info: PENICILLINS, DEMEROL MEPERIDINE  Psychotropic Info: donepezil (ARICEPT) tablet 10 mg PO at bedtime; FLUoxetine (PROZAC) capsule 20 mg daily; traZODone (DESYREL) tablet 100 mg PO at bedtime         Current Medications (09/29/2017):  This is the current hospital active medication list Current Facility-Administered Medications  Medication Dose Route Frequency Provider Last Rate Last Dose  . 0.9 %  sodium chloride infusion  250 mL Intravenous Continuous Kary Kos, MD      . acetaminophen (TYLENOL) tablet 650 mg  650 mg Oral Q4H PRN Kary Kos, MD       Or  . acetaminophen (TYLENOL) suppository 650 mg  650 mg Rectal Q4H PRN Kary Kos, MD      . alum & mag hydroxide-simeth (MAALOX/MYLANTA) 200-200-20 MG/5ML suspension 30 mL  30 mL Oral Q6H PRN Kary Kos, MD      . amLODipine (NORVASC) tablet 5 mg  5 mg Oral Daily Kary Kos, MD   5 mg at 09/29/17 1610  . aspirin EC tablet 325 mg  325 mg Oral Q1400 Kary Kos, MD   325 mg at 09/29/17 1511  . atorvastatin (LIPITOR) tablet 40 mg  40 mg Oral QHS Kary Kos, MD      . cyclobenzaprine (FLEXERIL) tablet 10 mg  10 mg Oral TID PRN Kary Kos, MD   10 mg at 09/28/17 1944  . donepezil (ARICEPT) tablet 10 mg  10 mg Oral QHS Kary Kos, MD   10 mg at 09/28/17 2101  . fenofibrate tablet 160 mg  160 mg Oral QHS Kary Kos, MD   160 mg at 09/28/17 2101  . FLUoxetine (PROZAC) capsule 20 mg  20 mg Oral Daily Kary Kos, MD   20 mg at  09/29/17 0903  . folic acid (FOLVITE) tablet 1 mg  1 mg Oral BID Kary Kos, MD   1 mg at 09/29/17 0903  . gabapentin (NEURONTIN) tablet 600 mg  600 mg Oral TID Kary Kos, MD   600 mg at 09/29/17 1511  . HYDROcodone-acetaminophen (NORCO) 10-325 MG per tablet 1 tablet  1 tablet Oral Q4H PRN Kary Kos, MD      . HYDROmorphone (DILAUDID) injection 1 mg  1 mg Intravenous Q2H PRN Kary Kos, MD   1 mg at 09/28/17 2101  . lactated ringers infusion   Intravenous Continuous Duane Boston, MD   Stopped at 09/28/17 1501  . lisinopril (PRINIVIL,ZESTRIL) tablet 20 mg  20 mg Oral QPM Kary Kos, MD   20 mg at 09/28/17 1817  . loratadine (CLARITIN) tablet 10 mg  10 mg Oral Daily Kary Kos, MD   10 mg at 09/29/17 0903  . menthol-cetylpyridinium (CEPACOL) lozenge 3 mg  1 lozenge Oral PRN Kary Kos, MD       Or  . phenol (CHLORASEPTIC) mouth spray 1 spray  1 spray Mouth/Throat PRN Kary Kos, MD      .  ondansetron (ZOFRAN) tablet 4 mg  4 mg Oral Q6H PRN Kary Kos, MD       Or  . ondansetron [facility]) injection 4 mg  4 mg Intravenous Q6H PRN Kary Kos, MD      . oxyCODONE (Oxy IR/ROXICODONE) immediate release tablet 10 mg  10 mg Oral Q3H PRN Kary Kos, MD   10 mg at 09/29/17 1208  . pantoprazole (PROTONIX) EC tablet 40 mg  40 mg Oral Daily Kary Kos, MD   40 mg at 09/29/17 0903  . sodium chloride flush (NS) 0.9 % injection 3 mL  3 mL Intravenous Q12H Kary Kos, MD   3 mL at 09/28/17 2106  . sodium chloride flush (NS) 0.9 % injection 3 mL  3 mL Intravenous PRN Kary Kos, MD      . tamsulosin [facility]) capsule 0.4 mg  0.4 mg Oral Daily Kary Kos, MD   0.4 mg at 09/29/17 0903  . traZODone (DESYREL) tablet 100 mg  100 mg Oral QHS Kary Kos, MD   100 mg at 09/28/17 2101     Discharge Medications: Please see discharge summary for a list of discharge medications.  Relevant Imaging Results:  Relevant Lab Results:   Additional Information [id number] [place], [place]

## 2017-09-29 NOTE — Social Work (Signed)
CSW sent clinical information to [facility], if pt is able to be placed there he will meet three night inpatient medical stay for Medicare and can be discharged the morning of 5/11.   CSW continuing to follow.  [place], [place] Work [phone]

## 2017-09-29 NOTE — Evaluation (Signed)
Physical Therapy Evaluation Patient Details Name: Bryan [name] MRN: [id number] DOB: 1940/09/16 Today's Date: 09/29/2017   History of Present Illness  Pt is a 77 y/o male with pmh significant for RA, HTN, multiple lumbar surgeries admitted with worsening pain and fusion failure, s/p rexploration of fusion, removal of L3 to S1 hardward and bilateral L3 screws,  extension of fixation /pedical screws up to L1/L2 marrying up to old screws from L4-S1.  PLA L1-L5.  Clinical Impression  Pt admitted with/for worsening lumbar/radicular pain, s/p back surgery as stated above.  Pt needing minor assist at this point and is not back to baseline functioning.  Pt currently limited functionally due to the problems listed. ( See problems list.)   Pt will benefit from PT to maximize function and safety in order to get ready for next venue listed below.     Follow Up Recommendations SNF    Equipment Recommendations  None recommended by PT    Recommendations for Other Services       Precautions / Restrictions Precautions Precautions: Back Precaution Booklet Issued: No Precaution Comments: per MD, no need for bracing.      Mobility  Bed Mobility               General bed mobility comments: discussed, demonstrated, but not tested.  Transfers Overall transfer level: Needs assistance   Transfers: Sit to/from Stand Sit to Stand: Min assist         General transfer comment: cues for hand placement, stability assist during transition of hands to AD  Ambulation/Gait Ambulation/Gait assistance: Min guard Ambulation Distance (Feet): 200 Feet Assistive device: Rolling walker (2 wheeled) Gait Pattern/deviations: Step-through pattern Gait velocity: slower   General Gait Details: weak-kneed gait with consistently mild knee flexion, flexed posture.  slow speed.  Stairs            Wheelchair Mobility    Modified Rankin (Stroke Patients Only)       Balance Overall balance  assessment: Needs assistance Sitting-balance support: No upper extremity supported Sitting balance-Leahy Scale: Fair       Standing balance-Leahy Scale: Fair                               Pertinent Vitals/Pain Pain Assessment: 0-10 Pain Score: 7  Pain Location: incision Pain Descriptors / Indicators: Sore Pain Intervention(s): Monitored during session;Premedicated before session    Home Living Family/patient expects to be discharged to:: Skilled nursing facility Living Arrangements: Spouse/significant other Available Help at Discharge: Family;Available PRN/intermittently(wife being assisted by dtr temporarily,  wife can't help pt.) Type of Home: Other(Comment)(condo) Home Access: Level entry     Home Layout: One level Home Equipment: Grab bars - tub/shower;Shower seat;Bedside commode;Walker - 2 wheels;Cane - single point;Adaptive equipment      Prior Function Level of Independence: Independent with assistive device(s)         Comments: pt ambulates with use of SPC,sometimes RW, and independent with ADLs     Hand Dominance   Dominant Hand: Right    Extremity/Trunk Assessment   Upper Extremity Assessment Upper Extremity Assessment: Defer to OT evaluation    Lower Extremity Assessment Lower Extremity Assessment: Generalized weakness;Overall WFL for tasks assessed(proximal, large muscle group weaknesses)       Communication   Communication: No difficulties  Cognition Arousal/Alertness: Awake/alert Behavior During Therapy: WFL for tasks assessed/performed Overall Cognitive Status: Within Functional Limits for tasks assessed  General Comments General comments (skin integrity, edema, etc.): Reinforced back care/prec, bed mobility, lifting precautions and progression of activity.    Exercises     Assessment/Plan    PT Assessment Patient needs continued PT services  PT Problem List Decreased  strength;Decreased activity tolerance;Decreased mobility;Decreased knowledge of precautions;Pain       PT Treatment Interventions Gait training;DME instruction;Functional mobility training;Therapeutic activities;Patient/family education    PT Goals (Current goals can be found in the Care Plan section)  Acute Rehab PT Goals Patient Stated Goal: ultimately home independent PT Goal Formulation: With patient Time For Goal Achievement: 10/06/17 Potential to Achieve Goals: Good    Frequency Min 5X/week   Barriers to discharge        Co-evaluation               AM-PAC PT "6 Clicks" Daily Activity  Outcome Measure Difficulty turning over in bed (including adjusting bedclothes, sheets and blankets)?: Unable Difficulty moving from lying on back to sitting on the side of the bed? : Unable Difficulty sitting down on and standing up from a chair with arms (e.g., wheelchair, bedside commode, etc,.)?: A Little Help needed moving to and from a bed to chair (including a wheelchair)?: A Little Help needed walking in hospital room?: A Little Help needed climbing 3-5 steps with a railing? : A Little 6 Click Score: 14    End of Session   Activity Tolerance: Patient tolerated treatment well Patient left: in chair;with call bell/phone within reach Nurse Communication: Mobility status PT Visit Diagnosis: Unsteadiness on feet (R26.81);Pain;Other abnormalities of gait and mobility (R26.89) Pain - part of body: (back)    Time: 6789-3810 PT Time Calculation (min) (ACUTE ONLY): 21 min   Charges:   PT Evaluation $PT Eval Low Complexity: 1 Low     PT G Codes:        2017/10/29  Donnella Sham, PT [phone]  (pager)  Bryan [name] 2017/10/29, 12:21 PM

## 2017-09-29 NOTE — Evaluation (Signed)
Occupational Therapy Evaluation Patient Details Name: Bryan [name] MRN: [id number] DOB: 01/09/1941 Today's Date: 09/29/2017    History of Present Illness Pt is a 77 y/o male with pmh significant for RA, HTN, multiple lumbar surgeries admitted with worsening pain and fusion failure, s/p rexploration of fusion, removal of L3 to S1 hardward and bilateral L3 screws,  extension of fixation /pedical screws up to L1/L2 marrying up to old screws from L4-S1.  PLA L1-L5.   Clinical Impression   Patient evaluated by Occupational Therapy with no further acute OT needs identified. All education has been completed and the patient has no further questions. Pt demonstrates good awareness of precautions and safety with ADLs.   See below for any follow-up Occupational Therapy or equipment needs. OT is signing off. Thank you for this referral.      Follow Up Recommendations  No OT follow up;Supervision/Assistance - 24 hour    Equipment Recommendations  None recommended by OT    Recommendations for Other Services       Precautions / Restrictions Precautions Precautions: Back Precaution Booklet Issued: No Precaution Comments: per MD, no need for bracing.      Mobility Bed Mobility Overal bed mobility: Modified Independent             General bed mobility comments: Pt demonstrates good understanding of back precautions   Transfers Overall transfer level: Needs assistance   Transfers: Sit to/from Stand Sit to Stand: Supervision         General transfer comment: cues for hand placement, stability assist during transition of hands to AD    Balance Overall balance assessment: Needs assistance Sitting-balance support: No upper extremity supported Sitting balance-Leahy Scale: Good     Standing balance support: No upper extremity supported;During functional activity Standing balance-Leahy Scale: Fair                             ADL either performed or assessed with  clinical judgement   ADL Overall ADL's : Needs assistance/impaired Eating/Feeding: Independent   Grooming: Wash/dry hands;Wash/dry face;Oral care;Brushing hair;Supervision/safety;Standing Grooming Details (indicate cue type and reason): able to state correct technique to avoid bending while grooming  Upper Body Bathing: Supervision/ safety;Set up;Sitting   Lower Body Bathing: Supervison/ safety;Sit to/from stand   Upper Body Dressing : Set up;Sitting   Lower Body Dressing: Supervision/safety;Set up;Sit to/from stand   Toilet Transfer: Supervision/safety;Ambulation;Comfort height toilet;Grab bars;RW   Toileting- Clothing Manipulation and Hygiene: Supervision/safety;Sit to/from stand   Tub/ Shower Transfer: Walk-in shower;Supervision/safety;Ambulation;Rolling walker Tub/Shower Transfer Details (indicate cue type and reason): discussed use of 3in1 commode in shower  Functional mobility during ADLs: Supervision/safety;Rolling walker;Cueing for safety General ADL Comments: able to cross ankles over knees      Vision         Perception     Praxis      Pertinent Vitals/Pain Pain Assessment: 0-10 Pain Score: 4  Pain Location: incision Pain Descriptors / Indicators: Sore Pain Intervention(s): Monitored during session     Hand Dominance Right   Extremity/Trunk Assessment Upper Extremity Assessment Upper Extremity Assessment: Overall WFL for tasks assessed   Lower Extremity Assessment Lower Extremity Assessment: Defer to PT evaluation       Communication Communication Communication: No difficulties   Cognition Arousal/Alertness: Awake/alert Behavior During Therapy: WFL for tasks assessed/performed Overall Cognitive Status: Within Functional Limits for tasks assessed  General Comments  Reinforced back care/prec, bed mobility, lifting precautions and progression of activity.    Exercises     Shoulder Instructions       Home Living Family/patient expects to be discharged to:: Skilled nursing facility Living Arrangements: Spouse/significant other Available Help at Discharge: Family;Available PRN/intermittently(wife being assisted by dtr temporarily,  wife can't help pt.) Type of Home: Other(Comment)(condo) Home Access: Level entry     Home Layout: One level     Bathroom Shower/Tub: Occupational psychologist: Handicapped height Bathroom Accessibility: Yes   Home Equipment: Grab bars - tub/shower;Shower seat;Bedside commode;Walker - 2 wheels;Cane - single point;Adaptive equipment Adaptive Equipment: Reacher;Sock aid;Long-handled shoe horn;Long-handled sponge        Prior Functioning/Environment Level of Independence: Independent with assistive device(s)        Comments: Pt is retired Software engineer.  pt ambulates with use of SPC,sometimes RW, and independent with ADLs        OT Problem List: Pain;Decreased knowledge of precautions      OT Treatment/Interventions:      OT Goals(Current goals can be found in the care plan section) Acute Rehab OT Goals Patient Stated Goal: to go home and get back to normal  OT Goal Formulation: All assessment and education complete, DC therapy  OT Frequency:     Barriers to D/C:            Co-evaluation              AM-PAC PT "6 Clicks" Daily Activity     Outcome Measure Help from another person eating meals?: None Help from another person taking care of personal grooming?: A Little Help from another person toileting, which includes using toliet, bedpan, or urinal?: A Little Help from another person bathing (including washing, rinsing, drying)?: A Little Help from another person to put on and taking off regular upper body clothing?: A Little Help from another person to put on and taking off regular lower body clothing?: A Little 6 Click Score: 19   End of Session Equipment Utilized During Treatment: Rolling walker Nurse  Communication: Mobility status  Activity Tolerance: Patient tolerated treatment well Patient left: in bed;with call bell/phone within reach;with family/visitor present  OT Visit Diagnosis: Pain Pain - part of body: (back )                Time: 2440-1027 OT Time Calculation (min): 21 min Charges:  OT General Charges $OT Visit: 1 Visit OT Evaluation $OT Eval Low Complexity: 1 Low G-Codes:     [facility], OTR/L [phone]   Lucille Passy M 09/29/2017, 3:35 PM

## 2017-09-29 NOTE — Clinical Social Work Note (Addendum)
Clinical Social Work Assessment  Patient Details  Name: Bryan [name] MRN: [id number] Date of Birth: [birth year]  Date of referral:  09/29/17               Reason for consult:  Facility Placement                Permission sought to share information with:  Family Supports Permission granted to share information::  Yes, Verbal Permission Granted  Name::     Elyse Jarvis  Agency::  [facility]  Relationship::  wife  Contact Information:     Housing/Transportation Living arrangements for the past 2 months:  Tax adviser of Information:  Patient, Adult Children, Spouse Patient Interpreter Needed:  None Criminal Activity/Legal Involvement Pertinent to Current Situation/Hospitalization:  No - Comment as needed Significant Relationships:  Adult Children, Spouse Lives with:  Spouse Do you feel safe going back to the place where you live?  Yes Need for family participation in patient care:  Yes (Comment)(to support pt because pt wife cannot assist him )  Care giving concerns:  Pt wife is unable to physically support pt's rehabilitation, pt daughter is available for supervision but has health problems which prevent full assistance. Pt wife is going to have surgery and he would like to be able to support her.    Social Worker assessment / plan: CSW met with pt at bedside, pt daughter and pt wife also at bedside. 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 Pt currently doing well functionally but feels he would benefit from at least a short time at rehab. Pt wife is unable to currently assist pt, as she has her own surgery coming up. Pt daugher is available for supervision but she also has health issues preventing her from assistance with some ADLs for her parents. Pt states that he has been to [facility] before and would be good with discharging there, aware that facility stay may not be lengthy given  pt progress so far. Pt states understanding.   Pt clinicals sent to [facility], has no further concerns but hopes he is able to discharge to SNF. Aware 3 night stay will be met on Saturday.   Employment status:  Retired Forensic scientist:  [employer] PT Recommendations:  [place], [place] / Referral to community resources:  [place]  Patient/Family's Response to care:  Pt knowledgeable about SNF process and is hopeful for at least a short term placement in order to return to baseline and assist his wife after her surgery.    Patient/Family's Understanding of and Emotional Response to Diagnosis, Current Treatment, and Prognosis:  Pt and pt family very pleasant and knowledgeable about health concerns. Pt and pt family state understanding of diagnosis, current treatment and prognosis, as pt has had surgery before. Pt states that he has good goals for himself and wants to be back home as soon as he can.   Emotional Assessment Appearance:  Appears stated age, Well-Groomed Attitude/Demeanor/Rapport:  Gracious, Engaged Affect (typically observed):  Accepting, Adaptable, Appropriate, Hopeful, Pleasant Orientation:  Oriented to Self, Oriented to Place, Oriented to  Time, Oriented to Situation Alcohol / Substance use:  Alcohol Use(Occasional glass of wine; former smoker) Psych involvement (Current and /or in the community):  No (Comment)  Discharge Needs  Concerns to be addressed:  Care Coordination Readmission within the last 30 days:  No Current discharge  risk:  Dependent with Mobility, Other(little physical support at home) Barriers to Discharge:  Continued Medical Work up, [employer], [place] 09/29/2017, 5:30 PM

## 2017-09-30 ENCOUNTER — Encounter (HOSPITAL_COMMUNITY): Payer: Self-pay | Admitting: Neurosurgery

## 2017-09-30 MED ORDER — SENNOSIDES-DOCUSATE SODIUM 8.6-50 MG PO TABS
1.0000 | ORAL_TABLET | Freq: Every day | ORAL | Status: DC
  Administered 2017-09-30: 1 via ORAL
  Filled 2017-09-30: qty 1

## 2017-09-30 MED ORDER — POLYETHYLENE GLYCOL 3350 17 G PO PACK
17.0000 g | PACK | Freq: Every day | ORAL | Status: DC
  Administered 2017-09-30 – 2017-10-01 (×2): 17 g via ORAL
  Filled 2017-09-30 (×2): qty 1

## 2017-09-30 NOTE — Progress Notes (Signed)
Physical Therapy Treatment Patient Details Name: Bryan [name] MRN: [id number] DOB: 1941-01-26 Today's Date: 09/30/2017    History of Present Illness Pt is a 77 y/o male with pmh significant for RA, HTN, multiple lumbar surgeries admitted with worsening pain and fusion failure, s/p rexploration of fusion, removal of L3 to S1 hardward and bilateral L3 screws,  extension of fixation /pedical screws up to L1/L2 marrying up to old screws from L4-S1.  PLA L1-L5.    PT Comments    Pt improving steadily.  Emphasized pt demonstrating safe transitions sidelying to/from sitting, log roll, safe transfer technique and gait stability.    Follow Up Recommendations  SNF     Equipment Recommendations  None recommended by PT    Recommendations for Other Services       Precautions / Restrictions Precautions Precautions: Back Precaution Comments: per MD, no need for bracing.    Mobility  Bed Mobility               General bed mobility comments: Pt demonstrates good understanding of back precautions   Transfers Overall transfer level: Needs assistance Equipment used: Rolling walker (2 wheeled) Transfers: Sit to/from Stand Sit to Stand: Supervision         General transfer comment: good safety, moving slowly and stiffly  Ambulation/Gait Ambulation/Gait assistance: Min guard Ambulation Distance (Feet): 300 Feet Assistive device: Rolling walker (2 wheeled) Gait Pattern/deviations: Step-through pattern Gait velocity: slower Gait velocity interpretation: 1.31 - 2.62 ft/sec, indicative of limited community ambulator General Gait Details: weak-kneed gait with weak back, consistently, but mildly flexed knees.  pt unable to increase speed readily.   Stairs             Wheelchair Mobility    Modified Rankin (Stroke Patients Only)       Balance Overall balance assessment: Needs assistance   Sitting balance-Leahy Scale: Good       Standing balance-Leahy Scale:  Fair                              Cognition Arousal/Alertness: Awake/alert Behavior During Therapy: WFL for tasks assessed/performed Overall Cognitive Status: Within Functional Limits for tasks assessed                                        Exercises      General Comments General comments (skin integrity, edema, etc.): Reinforced back care and critiqued bed mobility to make transitions smoother.      Pertinent Vitals/Pain Pain Assessment: Faces Faces Pain Scale: Hurts little more Pain Location: back Pain Descriptors / Indicators: Sore;Radiating Pain Intervention(s): Monitored during session    Home Living                      Prior Function            PT Goals (current goals can now be found in the care plan section) Acute Rehab PT Goals Patient Stated Goal: to go home and get back to normal  PT Goal Formulation: With patient Time For Goal Achievement: 10/06/17 Potential to Achieve Goals: Good Progress towards PT goals: Progressing toward goals    Frequency    Min 5X/week      PT Plan Current plan remains appropriate    Co-evaluation  AM-PAC PT "6 Clicks" Daily Activity  Outcome Measure  Difficulty turning over in bed (including adjusting bedclothes, sheets and blankets)?: A Little Difficulty moving from lying on back to sitting on the side of the bed? : A Little Difficulty sitting down on and standing up from a chair with arms (e.g., wheelchair, bedside commode, etc,.)?: A Little Help needed moving to and from a bed to chair (including a wheelchair)?: A Little Help needed walking in hospital room?: A Little Help needed climbing 3-5 steps with a railing? : A Little 6 Click Score: 18    End of Session   Activity Tolerance: Patient tolerated treatment well Patient left: in bed;with call bell/phone within reach Nurse Communication: Mobility status PT Visit Diagnosis: Unsteadiness on feet  (R26.81);Pain;Other abnormalities of gait and mobility (R26.89) Pain - part of body: (back)     Time: 7618-4859 PT Time Calculation (min) (ACUTE ONLY): 16 min  Charges:  $Gait Training: 8-22 mins                    G Codes:       10-03-2017  Donnella Sham, PT [phone]  (pager)   Tessie Fass Lane [place] 03-Oct-2017, 5:44 PM

## 2017-09-30 NOTE — Progress Notes (Signed)
NEUROSURGERY PROGRESS NOTE  Doing well. Complains of appropriate back soreness. No leg pain No numbness, tingling or weakness Ambulating and voiding well Good strength and sensation Incision CDI  Temp:  [97.9 F (36.6 C)-99.2 F (37.3 C)] 98.1 F (36.7 C) (05/10 0810) Pulse Rate:  [65-78] 70 (05/10 0810) Resp:  [16] 16 (05/09 1208) BP: (130-171)/(61-86) 130/63 (05/10 0810) SpO2:  [95 %-100 %] 95 % (05/10 0810)  Plan: Awaiting placement for tomorrow morning at [facility]. Will start mirilax  Eleonore Chiquito, NP 09/30/2017 10:50 AM

## 2017-09-30 NOTE — Care Management Note (Signed)
Case Management Note  Patient Details  Name: DALON REICHART MRN: [id number] Date of Birth: August 24, 1940  Subjective/Objective:   Pt is a 77 y/o male with pmh significant for RA, HTN, multiple lumbar surgeries admitted with worsening pain and fusion failure, s/p rexploration of fusion, removal of L3 to S1 hardward and bilateral L3 screws,  extension of fixation /pedical screws up to L1/L2 marrying up to old screws from L4-S1.  PLA L1-L5.  PTA, pt independent with assistive device, lives with wife.                   Action/Plan: Pt's wife unable to assist at dc, due to health issues.  SNF for rehab is recommended; plan dc on 5/11 to [facility], per CSW arrangements.   Expected Discharge Date:                  Expected Discharge Plan:  Skilled Nursing Facility  In-House Referral:  Clinical Social Work  Discharge planning Services  CM Consult  Post Acute Care Choice:    Choice offered to:     DME Arranged:    DME Agency:     HH Arranged:    [place] Agency:     Status of Service:  Completed, signed off  If discussed at [employer], dates discussed:    Additional Comments:  Ella Bodo, RN 09/30/2017, 4:33 PM

## 2017-10-01 DIAGNOSIS — I6529 Occlusion and stenosis of unspecified carotid artery: Secondary | ICD-10-CM | POA: Diagnosis not present

## 2017-10-01 DIAGNOSIS — K219 Gastro-esophageal reflux disease without esophagitis: Secondary | ICD-10-CM | POA: Diagnosis not present

## 2017-10-01 DIAGNOSIS — Z96652 Presence of left artificial knee joint: Secondary | ICD-10-CM | POA: Diagnosis not present

## 2017-10-01 DIAGNOSIS — M48062 Spinal stenosis, lumbar region with neurogenic claudication: Secondary | ICD-10-CM | POA: Diagnosis not present

## 2017-10-01 DIAGNOSIS — E785 Hyperlipidemia, unspecified: Secondary | ICD-10-CM | POA: Diagnosis not present

## 2017-10-01 DIAGNOSIS — N189 Chronic kidney disease, unspecified: Secondary | ICD-10-CM | POA: Diagnosis not present

## 2017-10-01 DIAGNOSIS — D649 Anemia, unspecified: Secondary | ICD-10-CM | POA: Diagnosis not present

## 2017-10-01 DIAGNOSIS — G629 Polyneuropathy, unspecified: Secondary | ICD-10-CM | POA: Diagnosis not present

## 2017-10-01 DIAGNOSIS — I2510 Atherosclerotic heart disease of native coronary artery without angina pectoris: Secondary | ICD-10-CM | POA: Diagnosis not present

## 2017-10-01 DIAGNOSIS — N3281 Overactive bladder: Secondary | ICD-10-CM | POA: Diagnosis not present

## 2017-10-01 DIAGNOSIS — M4807 Spinal stenosis, lumbosacral region: Secondary | ICD-10-CM | POA: Diagnosis not present

## 2017-10-01 DIAGNOSIS — R338 Other retention of urine: Secondary | ICD-10-CM | POA: Diagnosis not present

## 2017-10-01 DIAGNOSIS — E538 Deficiency of other specified B group vitamins: Secondary | ICD-10-CM | POA: Diagnosis not present

## 2017-10-01 DIAGNOSIS — M549 Dorsalgia, unspecified: Secondary | ICD-10-CM | POA: Diagnosis not present

## 2017-10-01 DIAGNOSIS — S32040A Wedge compression fracture of fourth lumbar vertebra, initial encounter for closed fracture: Secondary | ICD-10-CM | POA: Diagnosis not present

## 2017-10-01 DIAGNOSIS — I4891 Unspecified atrial fibrillation: Secondary | ICD-10-CM | POA: Diagnosis not present

## 2017-10-01 DIAGNOSIS — M1990 Unspecified osteoarthritis, unspecified site: Secondary | ICD-10-CM | POA: Diagnosis not present

## 2017-10-01 DIAGNOSIS — N403 Nodular prostate with lower urinary tract symptoms: Secondary | ICD-10-CM | POA: Diagnosis not present

## 2017-10-01 DIAGNOSIS — H401190 Primary open-angle glaucoma, unspecified eye, stage unspecified: Secondary | ICD-10-CM | POA: Diagnosis not present

## 2017-10-01 DIAGNOSIS — D693 Immune thrombocytopenic purpura: Secondary | ICD-10-CM | POA: Diagnosis not present

## 2017-10-01 DIAGNOSIS — K5900 Constipation, unspecified: Secondary | ICD-10-CM | POA: Diagnosis not present

## 2017-10-01 DIAGNOSIS — Z981 Arthrodesis status: Secondary | ICD-10-CM | POA: Diagnosis not present

## 2017-10-01 DIAGNOSIS — G4700 Insomnia, unspecified: Secondary | ICD-10-CM | POA: Diagnosis not present

## 2017-10-01 DIAGNOSIS — Z87891 Personal history of nicotine dependence: Secondary | ICD-10-CM | POA: Diagnosis not present

## 2017-10-01 DIAGNOSIS — M069 Rheumatoid arthritis, unspecified: Secondary | ICD-10-CM | POA: Diagnosis not present

## 2017-10-01 DIAGNOSIS — F0390 Unspecified dementia without behavioral disturbance: Secondary | ICD-10-CM | POA: Diagnosis not present

## 2017-10-01 DIAGNOSIS — F329 Major depressive disorder, single episode, unspecified: Secondary | ICD-10-CM | POA: Diagnosis not present

## 2017-10-01 DIAGNOSIS — I10 Essential (primary) hypertension: Secondary | ICD-10-CM | POA: Diagnosis not present

## 2017-10-01 DIAGNOSIS — I480 Paroxysmal atrial fibrillation: Secondary | ICD-10-CM | POA: Diagnosis not present

## 2017-10-01 DIAGNOSIS — T84296D Other mechanical complication of internal fixation device of vertebrae, subsequent encounter: Secondary | ICD-10-CM | POA: Diagnosis not present

## 2017-10-01 DIAGNOSIS — I129 Hypertensive chronic kidney disease with stage 1 through stage 4 chronic kidney disease, or unspecified chronic kidney disease: Secondary | ICD-10-CM | POA: Diagnosis not present

## 2017-10-01 DIAGNOSIS — Z7982 Long term (current) use of aspirin: Secondary | ICD-10-CM | POA: Diagnosis not present

## 2017-10-01 MED ORDER — HYDROCODONE-ACETAMINOPHEN 10-325 MG PO TABS: 1.0000 | ORAL_TABLET | Freq: Three times a day (TID) | ORAL | 0 refills | Status: DC | PRN

## 2017-10-01 MED ORDER — CYCLOBENZAPRINE HCL 10 MG PO TABS: 10.0000 mg | ORAL_TABLET | Freq: Three times a day (TID) | ORAL | 0 refills | Status: DC | PRN

## 2017-10-01 NOTE — Progress Notes (Signed)
Pt refused to leave via EMS and voiced he wanted his sister to take him. SW called to clarify to transport with no return call at this time.

## 2017-10-01 NOTE — Discharge Summary (Signed)
Physician Discharge Summary  Patient ID: Bryan [name] MRN: [id number] DOB/AGE: 79-Dec-1942 79 y.o.  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 The patient tolerated the procedure well and was taken to the recovery room and then to the floor in stable condition. The hospital course was routine. There were no complications. The wound remained clean dry and intact. Pt had appropriate back soreness. No complaints of leg pain or new N/T/W. The patient remained afebrile with stable vital signs, and tolerated a regular diet. The patient continued to increase activities, and pain was well controlled with oral pain medications.   Consults: None  Significant Diagnostic Studies:  Results for orders placed or performed during the hospital encounter of 09/20/17  Surgical pcr screen  Result Value Ref Range   MRSA, PCR NEGATIVE NEGATIVE   Staphylococcus aureus NEGATIVE NEGATIVE  Basic metabolic panel  Result Value Ref Range   Sodium 136 135 - 145 mmol/L   Potassium 4.5 3.5 - 5.1 mmol/L   Chloride 103 101 - 111 mmol/L   CO2 27 22 - 32 mmol/L   Glucose, Bld 91 65 - 99 mg/dL   BUN 22 (H) 6 - 20 mg/dL   Creatinine, Ser 1.20 0.61 - 1.24 mg/dL   Calcium 9.2 8.9 - 10.3 mg/dL   GFR calc non Af Amer 56 (L) >60 mL/min   GFR calc Af Amer >60 >60 mL/min   Anion gap 6 5 - 15  CBC  Result Value Ref Range   WBC 4.1 4.0 - 10.5 K/uL   RBC 3.74 (L) 4.22 - 5.81 MIL/uL   Hemoglobin 10.5 (L) 13.0 - 17.0 g/dL   HCT 33.1 (L) 39.0 - 52.0 %   MCV 88.5 78.0 - 100.0 fL   MCH 28.1 26.0 - 34.0 pg   MCHC 31.7 30.0 - 36.0 g/dL   RDW 14.6 11.5 - 15.5 %   Platelets 124 (L) 150 - 400 K/uL  Type and screen [place]  Result Value Ref Range   ABO/RH(D) O POS    Antibody Screen NEG    Sample Expiration 10/04/2017    Extend sample reason      NO TRANSFUSIONS OR PREGNANCY IN THE PAST 3 MONTHS Performed at [facility], [address], [place], [place] [address]    Unit Number [id number]    Blood Component Type RBC, LR IRR    Unit division 00    Status of Unit ALLOCATED    Transfusion Status OK TO TRANSFUSE    Crossmatch Result Compatible    Unit Number [id number]    Blood Component Type RBC, LR IRR    Unit division 00    Status of Unit ALLOCATED    Transfusion Status OK TO TRANSFUSE    Crossmatch Result Compatible   BPAM RBC  Result Value Ref Range   Blood Product Unit Number [id number]    Unit Type and Rh 9500    Blood Product Expiration Date 678938101751    Blood Product Unit Number [id number]    Unit Type and Rh 9500    Blood Product Expiration Date [id number]     Dg Lumbar Spine 2-3 Views  Result Date: 09/28/2017 CLINICAL DATA:  L1-2 extension fusion, removal of L3 screws. EXAM:  Lateral scout radiograph from a lumbar spine CT scan dated August 24, 2017 COMPARISON:  Lateral scout radiograph from a CT scan of the lumbar spine of August 24, 2017 FINDINGS: Two fluoro spot images are reviewed. The fluoro time reported is 38 seconds. There pedicle screws which have been placed at L1 and L2. No connecting rods are as yet present. L3 pedicle screws have been removed. IMPRESSION: Placement of pedicle screws at L1 and L2. Removal of pedicle screws at L3. Electronically Signed   By: David  Martinique M.D.   On: 09/28/2017 15:32   Dg C-arm 1-60 Min  Result Date: 09/28/2017 CLINICAL DATA:  L1-2 extension fusion, removal of L3 screws. EXAM: Lateral scout radiograph from a lumbar spine CT scan dated August 24, 2017 COMPARISON:  Lateral scout radiograph from a CT scan of the lumbar spine of August 24, 2017 FINDINGS: Two fluoro spot images are reviewed. The fluoro time reported is 38  seconds. There pedicle screws which have been placed at L1 and L2. No connecting rods are as yet present. L3 pedicle screws have been removed. IMPRESSION: Placement of pedicle screws at L1 and L2. Removal of pedicle screws at L3. Electronically Signed   By: David  Martinique M.D.   On: 09/28/2017 15:32    Antibiotics:  Anti-infectives (From admission, onward)   Start     Dose/Rate Route Frequency Ordered Stop   09/28/17 2230  vancomycin (VANCOCIN) 1,500 mg in sodium chloride 0.9 % 500 mL IVPB     1,500 mg 250 mL/hr over 120 Minutes Intravenous  Once 09/28/17 1720 09/29/17 0334   09/28/17 1715  ceFAZolin (ANCEF) IVPB 2g/100 mL premix  Status:  Discontinued     2 g 200 mL/hr over 30 Minutes Intravenous Every 8 hours 09/28/17 1701 09/28/17 1719   09/28/17 1701  amoxicillin (AMOXIL) capsule 2,000 mg  Status:  Discontinued    Note to Pharmacy:  Take 4 Capsules prior to dental appointment     2,000 mg Oral See admin instructions 09/28/17 1701 09/28/17 1703   09/28/17 1257  vancomycin (VANCOCIN) powder  Status:  Discontinued       As needed 09/28/17 1257 09/28/17 1500   09/28/17 1130  bacitracin 50,000 Units in sodium chloride 0.9 % 500 mL irrigation  Status:  Discontinued       As needed 09/28/17 1258 09/28/17 1500   09/28/17 0930  vancomycin (VANCOCIN) 1,500 mg in sodium chloride 0.9 % 500 mL IVPB     1,500 mg 250 mL/hr over 120 Minutes Intravenous To [facility] 09/27/17 1523 09/28/17 1240      Discharge Exam: Blood pressure (!) 125/50, pulse 78, temperature 98.3 F (36.8 C), temperature source Oral, resp. rate (!) 25, height 6' (1.829 m), weight 185 lb 6.5 oz (84.1 kg), SpO2 97 %. Neurologic: Grossly normal Ambulating and voiding well  Discharge Medications:   Allergies as of 10/01/2017      Reactions   Penicillins Hives, Swelling, Other (See Comments)   SWELLING REACTION UNSPECIFIED PATIENT HAS TAKEN AMOXICILLIN ON MED HX FROM [facility] PATIENT HAS HAD A PCN REACTION WITH IMMEDIATE  RASH, FACIAL/TONGUE/THROAT SWELLING, SOB, OR LIGHTHEADEDNESS WITH HYPOTENSION:  #  #  #  YES  #  #  #   Has patient had a PCN reaction causing severe rash involving mucus membranes or skin necrosis: No Has patient had a PCN reaction that required hospitalization No Has patient had a PCN reaction occurring within the last 10 years: No   Demerol [meperidine] Hives,  Nausea And Vomiting      Medication List    TAKE these medications   amLODipine 5 MG tablet Commonly known as:  NORVASC TAKE ONE TABLET BY MOUTH EVERY DAY   amoxicillin 500 MG capsule Commonly known as:  AMOXIL Take 2,000 mg by mouth See admin instructions. Take 4 Capsules prior to dental appointment   aspirin 325 MG EC tablet Take 325 mg by mouth daily at 2 PM.   atorvastatin 40 MG tablet Commonly known as:  LIPITOR Take 1 tablet (40 mg total) by mouth daily. What changed:  when to take this   cetirizine 10 MG tablet Commonly known as:  ZYRTEC Take 10 mg by mouth daily.   cyclobenzaprine 10 MG tablet Commonly known as:  FLEXERIL Take 1 tablet (10 mg total) by mouth 3 (three) times daily as needed for muscle spasms.   donepezil 10 MG tablet Commonly known as:  ARICEPT Take 10 mg by mouth at bedtime.   fenofibrate 160 MG tablet TAKE ONE TABLET BY MOUTH EVERY DAY What changed:    how much to take  how to take this  when to take this   FLUoxetine 20 MG capsule Commonly known as:  PROZAC TAKE 1 CAPSULE BY MOUTH EVERY DAY   folic acid 1 MG tablet Commonly known as:  FOLVITE Take 1 mg by mouth 2 (two) times daily.   gabapentin 600 MG tablet Commonly known as:  NEURONTIN Take 600 mg by mouth 3 (three) times daily.   HYDROcodone-acetaminophen 10-325 MG tablet Commonly known as:  NORCO Take 1 tablet by mouth every 4 (four) hours as needed for moderate pain. What changed:  Another medication with the same name was added. Make sure you understand how and when to take each.   HYDROcodone-acetaminophen  10-325 MG tablet Commonly known as:  NORCO Take 1 tablet by mouth every 8 (eight) hours as needed. What changed:  You were already taking a medication with the same name, and this prescription was added. Make sure you understand how and when to take each.   hydrocortisone cream 1 % Apply 1 application topically daily as needed for itching.   lisinopril 20 MG tablet Commonly known as:  PRINIVIL,ZESTRIL TAKE ONE TABLET BY MOUTH EVERY EVENING   omeprazole 40 MG capsule Commonly known as:  PRILOSEC TAKE 1 CAPSULE BY MOUTH DAILY What changed:    how much to take  how to take this  when to take this   polyethylene glycol powder powder Commonly known as:  GLYCOLAX/MIRALAX USE 1 CAPFUL IN 4 TO 8 OUNCES OF LIQUID DAILY   tamsulosin 0.4 MG Caps capsule Commonly known as:  FLOMAX TAKE 1 CAPSULE BY MOUTH DAILY What changed:    how much to take  how to take this  when to take this   testosterone cypionate 200 MG/ML injection Commonly known as:  DEPOTESTOSTERONE CYPIONATE INJECT 74mL INTO THE MUSCLE EVERY 14 DAYS   traZODone 50 MG tablet Commonly known as:  DESYREL Take 100 mg by mouth at bedtime.       Disposition: SNF   Final Dx: posterior lateral fusion with fixation L1-S1       Signed: Ocie [place] F[place] Matthis 10/01/2017, 5:42 AM

## 2017-10-01 NOTE — Progress Notes (Addendum)
CSW confirmed that pt has a bed at [facility] ready at 11am. Pt will be going to room 510. RN to call report to [phone].CSW has faxed over discharge summary to [facility]. CSW has spoke with Baker Janus pt's sister to update her of transport. Transport has been set up for 11:30am. There are no further CSW needs. CSW will sign off at this time.    Virgie Dad Evelio Rueda, MSW, [facility] Clinical Social Worker [phone]

## 2017-10-01 NOTE — Progress Notes (Addendum)
2:18pm-CSW spoke with Sunday Spillers from [facility] to update her that pt had not been on the medication that she inquired about since being in the hospital. At this time there are no further CSW needs CSW will sign off. IF new need arises please re consult.   CSW corrected number in last night to reflect [phone]. RN to call report to this number. CSW made aware that pt's sister arrived to pick pt up after CSW and pt spoke about [facility] transporting pt and pt being agreeable to this. CSW has spoken with Magda Paganini from [facility] and was informed that she still was expecting pt. CSW verbalized to Magda Paganini that [employer] Sunday Spillers expressed to RN calling report that he was not aware of pt coming. CSW informed Magda Paganini of this. Magda Paganini mentioned that Sunday Spillers knew pt was coming as Magda Paganini had recently informed her of admitting pt today. CSW was asked by Sunday Spillers to speak with RN [facility]) about a ceratin medication. CSW to follow up with Crystal about this and to update [place]. Sunday Spillers expressed that she usually speaks with CSW and goes over medications with CVSW before pt's are admitted to the facility. CSW expressed to Sunday Spillers that CSW had already spoken with Magda Paganini and that Magda Paganini expressed she had spoken with Sunday Spillers (reason CSW did not call Sunday Spillers).  Virgie Dad Cristin Penaflor, MSW, [facility] Clinical Social Worker [phone]

## 2017-10-01 NOTE — Progress Notes (Signed)
Pt transported to [facility] via [facility]. Report attempted x3 to be called to Facility, but number did not work. Reported call after a google search for the number. Facility charge nurse wants to speak for SW. SW page to be made aware.

## 2017-10-03 ENCOUNTER — Telehealth: Payer: Self-pay | Admitting: *Deleted

## 2017-10-03 DIAGNOSIS — M48062 Spinal stenosis, lumbar region with neurogenic claudication: Secondary | ICD-10-CM | POA: Diagnosis not present

## 2017-10-03 DIAGNOSIS — I2510 Atherosclerotic heart disease of native coronary artery without angina pectoris: Secondary | ICD-10-CM | POA: Diagnosis not present

## 2017-10-03 DIAGNOSIS — D693 Immune thrombocytopenic purpura: Secondary | ICD-10-CM | POA: Diagnosis not present

## 2017-10-03 DIAGNOSIS — I4891 Unspecified atrial fibrillation: Secondary | ICD-10-CM | POA: Diagnosis not present

## 2017-10-03 LAB — TYPE AND SCREEN
ABO/RH(D): O POS
Antibody Screen: NEGATIVE
Unit division: 0
Unit division: 0

## 2017-10-03 LAB — BPAM RBC
Blood Product Expiration Date: 201905212359
Blood Product Expiration Date: 201905212359
Unit Type and Rh: 9500
Unit Type and Rh: 9500

## 2017-10-06 DIAGNOSIS — M549 Dorsalgia, unspecified: Secondary | ICD-10-CM | POA: Diagnosis not present

## 2017-10-06 DIAGNOSIS — N3281 Overactive bladder: Secondary | ICD-10-CM | POA: Diagnosis not present

## 2017-10-06 DIAGNOSIS — K5900 Constipation, unspecified: Secondary | ICD-10-CM | POA: Diagnosis not present

## 2017-10-06 DIAGNOSIS — I10 Essential (primary) hypertension: Secondary | ICD-10-CM | POA: Diagnosis not present

## 2017-10-11 DIAGNOSIS — S32040A Wedge compression fracture of fourth lumbar vertebra, initial encounter for closed fracture: Secondary | ICD-10-CM | POA: Diagnosis not present

## 2017-10-11 DIAGNOSIS — M4807 Spinal stenosis, lumbosacral region: Secondary | ICD-10-CM | POA: Diagnosis not present

## 2017-10-12 ENCOUNTER — Other Ambulatory Visit: Payer: Self-pay | Admitting: Neurosurgery

## 2017-10-13 DIAGNOSIS — I10 Essential (primary) hypertension: Secondary | ICD-10-CM | POA: Diagnosis not present

## 2017-10-13 DIAGNOSIS — K5900 Constipation, unspecified: Secondary | ICD-10-CM | POA: Diagnosis not present

## 2017-10-13 DIAGNOSIS — M549 Dorsalgia, unspecified: Secondary | ICD-10-CM | POA: Diagnosis not present

## 2017-10-13 DIAGNOSIS — F329 Major depressive disorder, single episode, unspecified: Secondary | ICD-10-CM | POA: Diagnosis not present

## 2017-10-18 DIAGNOSIS — M810 Age-related osteoporosis without current pathological fracture: Secondary | ICD-10-CM | POA: Diagnosis not present

## 2017-10-18 DIAGNOSIS — G9782 Other postprocedural complications and disorders of nervous system: Secondary | ICD-10-CM | POA: Diagnosis not present

## 2017-10-18 DIAGNOSIS — I10 Essential (primary) hypertension: Secondary | ICD-10-CM | POA: Diagnosis not present

## 2017-10-18 DIAGNOSIS — E785 Hyperlipidemia, unspecified: Secondary | ICD-10-CM | POA: Diagnosis not present

## 2017-10-18 DIAGNOSIS — E1140 Type 2 diabetes mellitus with diabetic neuropathy, unspecified: Secondary | ICD-10-CM | POA: Diagnosis not present

## 2017-10-18 DIAGNOSIS — T84498A Other mechanical complication of other internal orthopedic devices, implants and grafts, initial encounter: Secondary | ICD-10-CM | POA: Diagnosis not present

## 2017-10-18 DIAGNOSIS — M545 Low back pain: Secondary | ICD-10-CM | POA: Diagnosis not present

## 2017-10-18 DIAGNOSIS — M069 Rheumatoid arthritis, unspecified: Secondary | ICD-10-CM | POA: Diagnosis not present

## 2017-10-18 DIAGNOSIS — Z981 Arthrodesis status: Secondary | ICD-10-CM | POA: Diagnosis not present

## 2017-10-18 DIAGNOSIS — Z7982 Long term (current) use of aspirin: Secondary | ICD-10-CM | POA: Diagnosis not present

## 2017-10-18 DIAGNOSIS — Z79899 Other long term (current) drug therapy: Secondary | ICD-10-CM | POA: Diagnosis not present

## 2017-10-19 ENCOUNTER — Encounter (HOSPITAL_COMMUNITY): Payer: Self-pay | Admitting: General Practice

## 2017-10-19 ENCOUNTER — Inpatient Hospital Stay (HOSPITAL_COMMUNITY)
Admission: RE | Admit: 2017-10-19 | Discharge: 2017-10-24 | DRG: 460 | Disposition: A | Discharge status: Skilled Nursing Facility | Payer: Medicare Other | Source: Ambulatory Visit | Attending: Neurosurgery | Admitting: Neurosurgery

## 2017-10-19 ENCOUNTER — Inpatient Hospital Stay (HOSPITAL_COMMUNITY): Payer: Medicare Other

## 2017-10-19 ENCOUNTER — Other Ambulatory Visit: Payer: Self-pay

## 2017-10-19 DIAGNOSIS — E785 Hyperlipidemia, unspecified: Secondary | ICD-10-CM | POA: Diagnosis not present

## 2017-10-19 DIAGNOSIS — F209 Schizophrenia, unspecified: Secondary | ICD-10-CM | POA: Diagnosis present

## 2017-10-19 DIAGNOSIS — T84226A Displacement of internal fixation device of vertebrae, initial encounter: Principal | ICD-10-CM | POA: Diagnosis present

## 2017-10-19 DIAGNOSIS — Z8249 Family history of ischemic heart disease and other diseases of the circulatory system: Secondary | ICD-10-CM | POA: Diagnosis not present

## 2017-10-19 DIAGNOSIS — Z9842 Cataract extraction status, left eye: Secondary | ICD-10-CM | POA: Diagnosis not present

## 2017-10-19 DIAGNOSIS — F329 Major depressive disorder, single episode, unspecified: Secondary | ICD-10-CM | POA: Diagnosis present

## 2017-10-19 DIAGNOSIS — Z961 Presence of intraocular lens: Secondary | ICD-10-CM | POA: Diagnosis present

## 2017-10-19 DIAGNOSIS — Z96652 Presence of left artificial knee joint: Secondary | ICD-10-CM | POA: Diagnosis present

## 2017-10-19 DIAGNOSIS — R402253 Coma scale, best verbal response, oriented, at hospital admission: Secondary | ICD-10-CM | POA: Diagnosis present

## 2017-10-19 DIAGNOSIS — Z952 Presence of prosthetic heart valve: Secondary | ICD-10-CM

## 2017-10-19 DIAGNOSIS — R531 Weakness: Secondary | ICD-10-CM | POA: Diagnosis not present

## 2017-10-19 DIAGNOSIS — Y793 Surgical instruments, materials and orthopedic devices (including sutures) associated with adverse incidents: Secondary | ICD-10-CM | POA: Diagnosis present

## 2017-10-19 DIAGNOSIS — Z880 Allergy status to penicillin: Secondary | ICD-10-CM

## 2017-10-19 DIAGNOSIS — M549 Dorsalgia, unspecified: Secondary | ICD-10-CM | POA: Diagnosis not present

## 2017-10-19 DIAGNOSIS — Z9841 Cataract extraction status, right eye: Secondary | ICD-10-CM | POA: Diagnosis not present

## 2017-10-19 DIAGNOSIS — Z885 Allergy status to narcotic agent status: Secondary | ICD-10-CM | POA: Diagnosis not present

## 2017-10-19 DIAGNOSIS — N400 Enlarged prostate without lower urinary tract symptoms: Secondary | ICD-10-CM | POA: Diagnosis not present

## 2017-10-19 DIAGNOSIS — I480 Paroxysmal atrial fibrillation: Secondary | ICD-10-CM | POA: Diagnosis not present

## 2017-10-19 DIAGNOSIS — Z981 Arthrodesis status: Secondary | ICD-10-CM | POA: Diagnosis not present

## 2017-10-19 DIAGNOSIS — S32012D Unstable burst fracture of first lumbar vertebra, subsequent encounter for fracture with routine healing: Secondary | ICD-10-CM | POA: Diagnosis not present

## 2017-10-19 DIAGNOSIS — M5134 Other intervertebral disc degeneration, thoracic region: Secondary | ICD-10-CM | POA: Diagnosis not present

## 2017-10-19 DIAGNOSIS — I2510 Atherosclerotic heart disease of native coronary artery without angina pectoris: Secondary | ICD-10-CM | POA: Diagnosis not present

## 2017-10-19 DIAGNOSIS — I10 Essential (primary) hypertension: Secondary | ICD-10-CM | POA: Diagnosis present

## 2017-10-19 DIAGNOSIS — H409 Unspecified glaucoma: Secondary | ICD-10-CM | POA: Diagnosis not present

## 2017-10-19 DIAGNOSIS — F3289 Other specified depressive episodes: Secondary | ICD-10-CM | POA: Diagnosis not present

## 2017-10-19 DIAGNOSIS — G9781 Other intraoperative complications of nervous system: Secondary | ICD-10-CM | POA: Diagnosis not present

## 2017-10-19 DIAGNOSIS — K219 Gastro-esophageal reflux disease without esophagitis: Secondary | ICD-10-CM | POA: Diagnosis present

## 2017-10-19 DIAGNOSIS — M532X5 Spinal instabilities, thoracolumbar region: Secondary | ICD-10-CM | POA: Diagnosis not present

## 2017-10-19 DIAGNOSIS — D520 Dietary folate deficiency anemia: Secondary | ICD-10-CM | POA: Diagnosis not present

## 2017-10-19 DIAGNOSIS — T8484XA Pain due to internal orthopedic prosthetic devices, implants and grafts, initial encounter: Secondary | ICD-10-CM | POA: Diagnosis present

## 2017-10-19 DIAGNOSIS — M4324 Fusion of spine, thoracic region: Secondary | ICD-10-CM | POA: Diagnosis not present

## 2017-10-19 DIAGNOSIS — G629 Polyneuropathy, unspecified: Secondary | ICD-10-CM | POA: Diagnosis present

## 2017-10-19 DIAGNOSIS — R402143 Coma scale, eyes open, spontaneous, at hospital admission: Secondary | ICD-10-CM | POA: Diagnosis present

## 2017-10-19 DIAGNOSIS — S32011A Stable burst fracture of first lumbar vertebra, initial encounter for closed fracture: Secondary | ICD-10-CM | POA: Diagnosis not present

## 2017-10-19 DIAGNOSIS — S32009K Unspecified fracture of unspecified lumbar vertebra, subsequent encounter for fracture with nonunion: Secondary | ICD-10-CM | POA: Diagnosis present

## 2017-10-19 DIAGNOSIS — Z825 Family history of asthma and other chronic lower respiratory diseases: Secondary | ICD-10-CM | POA: Diagnosis not present

## 2017-10-19 DIAGNOSIS — M069 Rheumatoid arthritis, unspecified: Secondary | ICD-10-CM | POA: Diagnosis present

## 2017-10-19 DIAGNOSIS — Z419 Encounter for procedure for purposes other than remedying health state, unspecified: Secondary | ICD-10-CM

## 2017-10-19 DIAGNOSIS — Z87891 Personal history of nicotine dependence: Secondary | ICD-10-CM | POA: Diagnosis not present

## 2017-10-19 DIAGNOSIS — S32012A Unstable burst fracture of first lumbar vertebra, initial encounter for closed fracture: Secondary | ICD-10-CM | POA: Diagnosis not present

## 2017-10-19 DIAGNOSIS — E119 Type 2 diabetes mellitus without complications: Secondary | ICD-10-CM | POA: Diagnosis not present

## 2017-10-19 DIAGNOSIS — J3089 Other allergic rhinitis: Secondary | ICD-10-CM | POA: Diagnosis not present

## 2017-10-19 DIAGNOSIS — G4700 Insomnia, unspecified: Secondary | ICD-10-CM | POA: Diagnosis not present

## 2017-10-19 DIAGNOSIS — M6281 Muscle weakness (generalized): Secondary | ICD-10-CM | POA: Diagnosis not present

## 2017-10-19 DIAGNOSIS — R402363 Coma scale, best motor response, obeys commands, at hospital admission: Secondary | ICD-10-CM | POA: Diagnosis present

## 2017-10-19 DIAGNOSIS — R279 Unspecified lack of coordination: Secondary | ICD-10-CM | POA: Diagnosis not present

## 2017-10-19 DIAGNOSIS — Z5189 Encounter for other specified aftercare: Secondary | ICD-10-CM | POA: Diagnosis not present

## 2017-10-19 DIAGNOSIS — Z743 Need for continuous supervision: Secondary | ICD-10-CM | POA: Diagnosis not present

## 2017-10-19 DIAGNOSIS — S32009A Unspecified fracture of unspecified lumbar vertebra, initial encounter for closed fracture: Secondary | ICD-10-CM | POA: Diagnosis present

## 2017-10-19 DIAGNOSIS — F419 Anxiety disorder, unspecified: Secondary | ICD-10-CM | POA: Diagnosis present

## 2017-10-19 DIAGNOSIS — Z7982 Long term (current) use of aspirin: Secondary | ICD-10-CM

## 2017-10-19 HISTORY — DX: Other seasonal allergic rhinitis: J30.2

## 2017-10-19 HISTORY — DX: Schizophrenia, unspecified: F20.9

## 2017-10-19 HISTORY — DX: Cardiac murmur, unspecified: R01.1

## 2017-10-19 HISTORY — DX: Anxiety disorder, unspecified: F41.9

## 2017-10-19 MED ORDER — HYDROMORPHONE HCL 1 MG/ML IJ SOLN
1.0000 mg | INTRAMUSCULAR | Status: DC | PRN
  Administered 2017-10-20 – 2017-10-24 (×13): 1 mg via INTRAVENOUS
  Filled 2017-10-19 (×13): qty 1

## 2017-10-19 MED ORDER — ACETAMINOPHEN 650 MG RE SUPP: 650.0000 mg | RECTAL | Status: DC | PRN

## 2017-10-19 MED ORDER — CYCLOBENZAPRINE HCL 10 MG PO TABS: 10.0000 mg | ORAL_TABLET | Freq: Three times a day (TID) | ORAL | Status: DC | PRN

## 2017-10-19 MED ORDER — ENSURE ENLIVE PO LIQD
237.0000 mL | Freq: Two times a day (BID) | ORAL | Status: DC
  Administered 2017-10-20 – 2017-10-22 (×4): 237 mL via ORAL

## 2017-10-19 MED ORDER — SODIUM CHLORIDE 0.9 % IV SOLN
250.0000 mL | INTRAVENOUS | Status: DC
  Administered 2017-10-19: 250 mL via INTRAVENOUS

## 2017-10-19 MED ORDER — ONDANSETRON HCL 4 MG PO TABS: 4.0000 mg | ORAL_TABLET | Freq: Four times a day (QID) | ORAL | Status: DC | PRN

## 2017-10-19 MED ORDER — ACETAMINOPHEN 325 MG PO TABS: 650.0000 mg | ORAL_TABLET | ORAL | Status: DC | PRN

## 2017-10-19 MED ORDER — ONDANSETRON HCL 4 MG/2ML IJ SOLN
4.0000 mg | Freq: Four times a day (QID) | INTRAMUSCULAR | Status: DC | PRN
  Administered 2017-10-20: 4 mg via INTRAVENOUS

## 2017-10-19 MED ORDER — PHENOL 1.4 % MT LIQD: 1.0000 | OROMUCOSAL | Status: DC | PRN

## 2017-10-19 MED ORDER — MENTHOL 3 MG MT LOZG: 1.0000 | LOZENGE | OROMUCOSAL | Status: DC | PRN

## 2017-10-19 MED ORDER — SODIUM CHLORIDE 0.9% FLUSH: 3.0000 mL | INTRAVENOUS | Status: DC | PRN

## 2017-10-19 MED ORDER — SODIUM CHLORIDE 0.9% FLUSH
3.0000 mL | Freq: Two times a day (BID) | INTRAVENOUS | Status: DC
  Administered 2017-10-19 – 2017-10-20 (×2): 3 mL via INTRAVENOUS

## 2017-10-19 MED ORDER — FAMOTIDINE IN NACL 20-0.9 MG/50ML-% IV SOLN
20.0000 mg | Freq: Two times a day (BID) | INTRAVENOUS | Status: DC
  Administered 2017-10-19 – 2017-10-20 (×3): 20 mg via INTRAVENOUS
  Filled 2017-10-19 (×3): qty 50

## 2017-10-19 MED ORDER — HYDROMORPHONE HCL 2 MG PO TABS
4.0000 mg | ORAL_TABLET | ORAL | Status: DC | PRN
  Administered 2017-10-19 – 2017-10-21 (×5): 4 mg via ORAL
  Filled 2017-10-19 (×5): qty 2

## 2017-10-19 MED ORDER — ALUM & MAG HYDROXIDE-SIMETH 200-200-20 MG/5ML PO SUSP: 30.0000 mL | Freq: Four times a day (QID) | ORAL | Status: DC | PRN

## 2017-10-19 NOTE — H&P (Signed)
Bryan [name] is an 77 y.o. male.   Chief Complaint: Back pain HPI: 77 year old woman who presented initially undergone an L1-L5 revision surgery for pseudoarthrosis and failing his L3 pedicle screws presented to the office last week with increased back pain and workup showed that he actually pulled out his L1 screws and the rods were displaced towards the skin. I recommended revision of surgery and offered him surgery later last week or this week and he wanted to put off secondary to his wife's surgery this week however his pain is getting progressively worse. So we are admitting him for pain control and moving up for surgery tomorrow afternoon. I'm planning on a T10 to L2 revision operation with removal of his L1 screws and utilizing fenestrated screws with Kyphon methylmethacrylate injected into the screws. I've extensively gone over the risks and benefits of revision surgery with him as well as perioperative course expectations of outcome and alternatives surgery and he understands and agrees to proceed forward.  Past Medical History:  Diagnosis Date  . Abnormal CT scan    Asymmetric left rectal wall thickening   . Allergy   . Anemia   . Arrhythmia   . Chronic lower back pain   . Complication of anesthesia    Memory loss 09/2015  . Coronary artery disease   . GERD (gastroesophageal reflux disease)   . Glaucoma   . H/O thoracic aortic aneurysm repair   . History of being hospitalized    memory lose kidney funtion down blood pressure up  . History of blood transfusion    "think he had one when he had heart valve OR" (10/14/2016)  . History of chicken pox   . Hyperlipidemia   . Hypertension   . Lumbar stenosis   . Neuropathy   . Osteoarthritis    worse in feet and ankles  . Paroxysmal A-fib ([place])   . Poor short term memory    takes Aricept  . Rheumatoid arthritis ([place])    "all over" (10/14/2016)  . Valvular heart disease     Past Surgical History:  Procedure Laterality Date   . AORTIC VALVE REPLACEMENT  2007   [facility].  [place], [place]  . APPENDECTOMY  2010  . BACK SURGERY    . CARDIAC VALVE REPLACEMENT    . CATARACT EXTRACTION W/ INTRAOCULAR LENS  IMPLANT, BILATERAL Bilateral   . COLONOSCOPY WITH PROPOFOL N/A 09/24/2016   Procedure: COLONOSCOPY WITH PROPOFOL;  Surgeon: Jonathon Bellows, MD;  Location[facility] END[facility]COPY;  Service: Endoscopy;  Laterality: N/A;  . ESOPHAGOGASTRODUODENOSCOPY (EGD) WITH PROPOFOL N/A 09/24/2016   Procedure: ESOPHAGOGASTRODUODENOSCOPY (EGD) WITH PROPOFOL;  Surgeon: Jonathon Bellows, MD;  Location: [facility] ENDOSCOPY;  Service: Endoscopy;  Laterality: N/A;  . EYE SURGERY Bilateral 2012   cataract surgery, lens implants  . GIVENS CAPSULE STUDY N/A 09/24/2016   Procedure: GIVENS CAPSULE STUDY;  Surgeon: Jonathon Bellows, MD;  Location: [facility] ENDOSCOPY;  Service: Endoscopy;  Laterality: N/A;  . HAMMER TOE SURGERY Right 10/09/2015   Procedure: HAMMER TOE REPAIR WITH K-WIRE FIXATION RIGHT SECOND TOE;  Surgeon: Albertine Patricia, DPM;  Location: [facility];  Service: Podiatry;  Laterality: Right;  WITH LOCAL  . JOINT REPLACEMENT Left 2008   knee  . LAMINECTOMY WITH POSTERIOR LATERAL ARTHRODESIS LEVEL 3 N/A 09/28/2017   Procedure: LUMBAR  POSTEROR  FUSION REVISION - LUMBAR ONE -TWO, LUMBAR TWO -THREE,THREE-FOUR ,BILATERAL ARTHRODESIS REMOVAL LUMBAR THREE HARDWARE;  Surgeon: Kary Kos, MD;  Location: [facility];  Service: Neurosurgery;  Laterality:  N/A;  . LAPAROSCOPIC CHOLECYSTECTOMY  2010  . LUMBAR FUSION Right 06/08/2017   LUMBAR THREE-FOUR, LUMBAR FOUR-FIVE POSTEROLATERAL ARTHRODESIS WITH RIGHT LUMBAR FOUR-FIVE LAMINECTOMY/FORAMINOTOMY  . LUMBAR LAMINECTOMY/DECOMPRESSION MICRODISCECTOMY Right 03/08/2016   Procedure: Laminectomy and Foraminotomy - Lumbar Five-Sacral One Right;  Surgeon: Kary Kos, MD;  Location: [facility];  Service: Neurosurgery;  Laterality: Right;  Right  . MULTIPLE TOOTH EXTRACTIONS     "3"  . POSTERIOR LUMBAR FUSION  10/14/2016   L5-S1  .  REPLACEMENT TOTAL KNEE Left 2003  . THORACIC AORTIC ANEURYSM REPAIR  2010    Family History  Problem Relation Age of Onset  . Heart failure Mother   . Hypertension Mother   . Asthma Mother   . Heart attack Father 71       MI  . Hypertension Sister   . Prostate cancer Neg Hx   . Bladder Cancer Neg Hx   . Kidney cancer Neg Hx    Social History:  reports that he quit smoking about 36 years ago. His smoking use included cigarettes. He has a 32.00 pack-year smoking history. He has never used smokeless tobacco. He reports that he drinks alcohol. He reports that he does not use drugs.  Allergies:  Allergies  Allergen Reactions  . Penicillins Hives, Swelling and Other (See Comments)    SWELLING REACTION UNSPECIFIED PATIENT HAS TAKEN AMOXICILLIN ON MED HX FROM [facility] PATIENT HAS HAD A PCN REACTION WITH IMMEDIATE RASH, FACIAL/TONGUE/THROAT SWELLING, SOB, OR LIGHTHEADEDNESS WITH HYPOTENSION:  #  #  #  YES  #  #  #   Has patient had a PCN reaction causing severe rash involving mucus membranes or skin necrosis: No Has patient had a PCN reaction that required hospitalization No Has patient had a PCN reaction occurring within the last 10 years: No  . Demerol [Meperidine] Hives and Nausea And Vomiting    No medications prior to admission.    No results found for this or any previous visit (from the past 48 hour(s)). No results found.  Review of Systems  Musculoskeletal: Positive for back pain.    There were no vitals taken for this visit. Physical Exam  Constitutional: He is oriented to person, place, and time. He appears well-developed.  HENT:  Head: Normocephalic.  Eyes: Pupils are equal, round, and reactive to light.  Respiratory: Effort normal.  GI: Soft.  Musculoskeletal: Normal range of motion.  Neurological: He is alert and oriented to person, place, and time. He has normal strength. GCS eye subscore is 4. GCS verbal subscore is 5. GCS motor subscore is 6.  Patient is awake  and alert oriented 4 strength is 5 out of 5 upper and lower extremities palpable mass top of his incision is back consistent with rod and screw displacement.  Skin: Skin is warm and dry.     Assessment/Plan 77 year old gentleman presents for brachial lumbar revision fusion with removal of loose L1 screws and placement of bilateral T10 T11 T12 screws.  Jakhiya Brower P, MD 10/19/2017, 4:50 PM

## 2017-10-20 ENCOUNTER — Inpatient Hospital Stay (HOSPITAL_COMMUNITY): Payer: Medicare Other

## 2017-10-20 ENCOUNTER — Inpatient Hospital Stay (HOSPITAL_COMMUNITY): Payer: Medicare Other | Admitting: Anesthesiology

## 2017-10-20 ENCOUNTER — Inpatient Hospital Stay (HOSPITAL_COMMUNITY)
Admission: RE | Disposition: A | Discharge status: Skilled Nursing Facility | Payer: Self-pay | Source: Ambulatory Visit | Attending: Neurosurgery

## 2017-10-20 ENCOUNTER — Encounter (HOSPITAL_COMMUNITY): Payer: Self-pay | Admitting: Anesthesiology

## 2017-10-20 DIAGNOSIS — S32009A Unspecified fracture of unspecified lumbar vertebra, initial encounter for closed fracture: Secondary | ICD-10-CM | POA: Diagnosis present

## 2017-10-20 HISTORY — PX: LAMINECTOMY WITH POSTERIOR LATERAL ARTHRODESIS LEVEL 3: SHX6337

## 2017-10-20 LAB — CBC
HCT: 28.2 % — ABNORMAL LOW (ref 39.0–52.0)
Hemoglobin: 9 g/dL — ABNORMAL LOW (ref 13.0–17.0)
MCH: 27.5 pg (ref 26.0–34.0)
MCHC: 31.9 g/dL (ref 30.0–36.0)
MCV: 86.2 fL (ref 78.0–100.0)
Platelets: 263 10*3/uL (ref 150–400)
RBC: 3.27 MIL/uL — ABNORMAL LOW (ref 4.22–5.81)
RDW: 14.6 % (ref 11.5–15.5)
WBC: 3.8 10*3/uL — ABNORMAL LOW (ref 4.0–10.5)

## 2017-10-20 LAB — TYPE AND SCREEN
ABO/RH(D): O POS
Antibody Screen: NEGATIVE

## 2017-10-20 LAB — BASIC METABOLIC PANEL
Anion gap: 12 (ref 5–15)
BUN: 22 mg/dL — ABNORMAL HIGH (ref 6–20)
CO2: 25 mmol/L (ref 22–32)
Calcium: 9.5 mg/dL (ref 8.9–10.3)
Chloride: 99 mmol/L — ABNORMAL LOW (ref 101–111)
Creatinine, Ser: 1.12 mg/dL (ref 0.61–1.24)
GFR calc Af Amer: >60 mL/min (ref >60)
GFR calc non Af Amer: >60 mL/min (ref >60)
Glucose, Bld: 92 mg/dL (ref 65–99)
Potassium: 4 mmol/L (ref 3.5–5.1)
Sodium: 136 mmol/L (ref 135–145)

## 2017-10-20 LAB — MRSA PCR SCREENING: MRSA by PCR: NEGATIVE

## 2017-10-20 SURGERY — LAMINECTOMY WITH POSTERIOR LATERAL ARTHRODESIS LEVEL 3
Anesthesia: General | Site: Back

## 2017-10-20 MED ORDER — THROMBIN 20000 UNITS EX SOLR
CUTANEOUS | Status: AC
  Filled 2017-10-20: qty 20000

## 2017-10-20 MED ORDER — LIDOCAINE-EPINEPHRINE 1 %-1:100000 IJ SOLN
INTRAMUSCULAR | Status: DC | PRN
  Administered 2017-10-20: 8 mL

## 2017-10-20 MED ORDER — ROCURONIUM BROMIDE 50 MG/5ML IV SOLN
INTRAVENOUS | Status: AC
  Filled 2017-10-20: qty 1

## 2017-10-20 MED ORDER — THROMBIN 5000 UNITS EX SOLR
CUTANEOUS | Status: AC
  Filled 2017-10-20: qty 5000

## 2017-10-20 MED ORDER — SODIUM CHLORIDE 0.9 % IV SOLN
INTRAVENOUS | Status: DC | PRN
  Administered 2017-10-20: 17:00:00

## 2017-10-20 MED ORDER — SUGAMMADEX SODIUM 200 MG/2ML IV SOLN
INTRAVENOUS | Status: AC
  Filled 2017-10-20: qty 2

## 2017-10-20 MED ORDER — ACETAMINOPHEN 325 MG PO TABS: 650.0000 mg | ORAL_TABLET | ORAL | Status: DC | PRN

## 2017-10-20 MED ORDER — LACTATED RINGERS IV SOLN
INTRAVENOUS | Status: DC
  Administered 2017-10-20 (×2): via INTRAVENOUS

## 2017-10-20 MED ORDER — LIDOCAINE HCL (CARDIAC) PF 100 MG/5ML IV SOSY
PREFILLED_SYRINGE | INTRAVENOUS | Status: DC | PRN
  Administered 2017-10-20: 10 mg via INTRATRACHEAL

## 2017-10-20 MED ORDER — PANTOPRAZOLE SODIUM 40 MG PO TBEC
40.0000 mg | DELAYED_RELEASE_TABLET | Freq: Every day | ORAL | Status: DC
  Administered 2017-10-20 – 2017-10-24 (×5): 40 mg via ORAL
  Filled 2017-10-20 (×5): qty 1

## 2017-10-20 MED ORDER — BUPIVACAINE LIPOSOME 1.3 % IJ SUSP
INTRAMUSCULAR | Status: DC | PRN
  Administered 2017-10-20: 20 mL

## 2017-10-20 MED ORDER — ROCURONIUM BROMIDE 100 MG/10ML IV SOLN
INTRAVENOUS | Status: DC | PRN
  Administered 2017-10-20 (×2): 20 mg via INTRAVENOUS
  Administered 2017-10-20: 30 mg via INTRAVENOUS
  Administered 2017-10-20: 20 mg via INTRAVENOUS
  Administered 2017-10-20: 30 mg via INTRAVENOUS

## 2017-10-20 MED ORDER — VANCOMYCIN HCL IN DEXTROSE 1-5 GM/200ML-% IV SOLN
1000.0000 mg | INTRAVENOUS | Status: AC
  Administered 2017-10-20: 1000 mg via INTRAVENOUS
  Filled 2017-10-20: qty 200

## 2017-10-20 MED ORDER — ONDANSETRON HCL 4 MG/2ML IJ SOLN: 4.0000 mg | Freq: Once | INTRAMUSCULAR | Status: DC | PRN

## 2017-10-20 MED ORDER — FENTANYL CITRATE (PF) 250 MCG/5ML IJ SOLN
INTRAMUSCULAR | Status: AC
  Filled 2017-10-20: qty 5

## 2017-10-20 MED ORDER — VANCOMYCIN HCL 1000 MG IV SOLR
INTRAVENOUS | Status: AC
  Filled 2017-10-20: qty 1000

## 2017-10-20 MED ORDER — ALUM & MAG HYDROXIDE-SIMETH 200-200-20 MG/5ML PO SUSP: 30.0000 mL | Freq: Four times a day (QID) | ORAL | Status: DC | PRN

## 2017-10-20 MED ORDER — VANCOMYCIN HCL IN DEXTROSE 1-5 GM/200ML-% IV SOLN
1000.0000 mg | Freq: Two times a day (BID) | INTRAVENOUS | Status: DC
  Administered 2017-10-21 – 2017-10-24 (×7): 1000 mg via INTRAVENOUS
  Filled 2017-10-20 (×8): qty 200

## 2017-10-20 MED ORDER — FENTANYL CITRATE (PF) 100 MCG/2ML IJ SOLN
INTRAMUSCULAR | Status: AC
  Filled 2017-10-20: qty 2

## 2017-10-20 MED ORDER — THROMBIN (RECOMBINANT) 20000 UNITS EX SOLR
CUTANEOUS | Status: DC | PRN
  Administered 2017-10-20: 17:00:00 via TOPICAL

## 2017-10-20 MED ORDER — ONDANSETRON HCL 4 MG/2ML IJ SOLN
INTRAMUSCULAR | Status: AC
  Filled 2017-10-20: qty 2

## 2017-10-20 MED ORDER — SODIUM CHLORIDE 0.9% FLUSH
3.0000 mL | INTRAVENOUS | Status: DC | PRN
  Administered 2017-10-21: 3 mL via INTRAVENOUS
  Filled 2017-10-20: qty 3

## 2017-10-20 MED ORDER — ONDANSETRON HCL 4 MG PO TABS
4.0000 mg | ORAL_TABLET | Freq: Four times a day (QID) | ORAL | Status: DC | PRN
  Administered 2017-10-22 (×2): 4 mg via ORAL
  Filled 2017-10-20 (×2): qty 1

## 2017-10-20 MED ORDER — DEXAMETHASONE SODIUM PHOSPHATE 10 MG/ML IJ SOLN
INTRAMUSCULAR | Status: AC
  Filled 2017-10-20: qty 1

## 2017-10-20 MED ORDER — ONDANSETRON HCL 4 MG/2ML IJ SOLN: 4.0000 mg | Freq: Four times a day (QID) | INTRAMUSCULAR | Status: DC | PRN

## 2017-10-20 MED ORDER — DEXMEDETOMIDINE HCL IN NACL 200 MCG/50ML IV SOLN
INTRAVENOUS | Status: AC
  Filled 2017-10-20: qty 50

## 2017-10-20 MED ORDER — SUGAMMADEX SODIUM 200 MG/2ML IV SOLN
INTRAVENOUS | Status: DC | PRN
  Administered 2017-10-20: 200 mg via INTRAVENOUS

## 2017-10-20 MED ORDER — LABETALOL HCL 5 MG/ML IV SOLN
INTRAVENOUS | Status: AC
  Filled 2017-10-20: qty 4

## 2017-10-20 MED ORDER — LABETALOL HCL 5 MG/ML IV SOLN
INTRAVENOUS | Status: DC | PRN
  Administered 2017-10-20: 5 mg via INTRAVENOUS

## 2017-10-20 MED ORDER — 0.9 % SODIUM CHLORIDE (POUR BTL) OPTIME
TOPICAL | Status: DC | PRN
  Administered 2017-10-20 (×3): 1000 mL

## 2017-10-20 MED ORDER — OXYCODONE HCL 5 MG/5ML PO SOLN: 5.0000 mg | Freq: Once | ORAL | Status: DC | PRN

## 2017-10-20 MED ORDER — CYCLOBENZAPRINE HCL 10 MG PO TABS
ORAL_TABLET | ORAL | Status: AC
  Filled 2017-10-20: qty 1

## 2017-10-20 MED ORDER — FENTANYL CITRATE (PF) 100 MCG/2ML IJ SOLN
25.0000 ug | INTRAMUSCULAR | Status: DC | PRN
  Administered 2017-10-20 (×2): 50 ug via INTRAVENOUS

## 2017-10-20 MED ORDER — FENTANYL CITRATE (PF) 250 MCG/5ML IJ SOLN
INTRAMUSCULAR | Status: DC | PRN
  Administered 2017-10-20 (×7): 50 ug via INTRAVENOUS
  Administered 2017-10-20: 100 ug via INTRAVENOUS
  Administered 2017-10-20 (×2): 50 ug via INTRAVENOUS

## 2017-10-20 MED ORDER — VANCOMYCIN HCL 1 G IV SOLR
INTRAVENOUS | Status: DC | PRN
  Administered 2017-10-20: 1000 mg via TOPICAL

## 2017-10-20 MED ORDER — DEXAMETHASONE SODIUM PHOSPHATE 10 MG/ML IJ SOLN
10.0000 mg | INTRAMUSCULAR | Status: AC
  Administered 2017-10-20: 10 mg via INTRAVENOUS
  Filled 2017-10-20: qty 1

## 2017-10-20 MED ORDER — CHLORHEXIDINE GLUCONATE CLOTH 2 % EX PADS: 6.0000 | MEDICATED_PAD | Freq: Once | CUTANEOUS | Status: DC

## 2017-10-20 MED ORDER — SODIUM CHLORIDE 0.9 % IV SOLN
250.0000 mL | INTRAVENOUS | Status: DC
  Administered 2017-10-20: 250 mL via INTRAVENOUS

## 2017-10-20 MED ORDER — PROPOFOL 10 MG/ML IV BOLUS
INTRAVENOUS | Status: AC
  Filled 2017-10-20: qty 20

## 2017-10-20 MED ORDER — SODIUM CHLORIDE 0.9% FLUSH
3.0000 mL | Freq: Two times a day (BID) | INTRAVENOUS | Status: DC
  Administered 2017-10-20 – 2017-10-24 (×8): 3 mL via INTRAVENOUS

## 2017-10-20 MED ORDER — BUPIVACAINE LIPOSOME 1.3 % IJ SUSP
20.0000 mL | INTRAMUSCULAR | Status: AC
  Filled 2017-10-20: qty 20

## 2017-10-20 MED ORDER — CYCLOBENZAPRINE HCL 10 MG PO TABS
10.0000 mg | ORAL_TABLET | Freq: Three times a day (TID) | ORAL | Status: DC | PRN
  Administered 2017-10-20 – 2017-10-24 (×8): 10 mg via ORAL
  Filled 2017-10-20 (×7): qty 1

## 2017-10-20 MED ORDER — LIDOCAINE 2% (20 MG/ML) 5 ML SYRINGE
INTRAMUSCULAR | Status: AC
  Filled 2017-10-20: qty 5

## 2017-10-20 MED ORDER — PHENOL 1.4 % MT LIQD: 1.0000 | OROMUCOSAL | Status: DC | PRN

## 2017-10-20 MED ORDER — PROPOFOL 10 MG/ML IV BOLUS
INTRAVENOUS | Status: DC | PRN
  Administered 2017-10-20: 120 mg via INTRAVENOUS

## 2017-10-20 MED ORDER — OXYCODONE HCL 5 MG PO TABS: 5.0000 mg | ORAL_TABLET | Freq: Once | ORAL | Status: DC | PRN

## 2017-10-20 MED ORDER — LIDOCAINE-EPINEPHRINE 1 %-1:100000 IJ SOLN
INTRAMUSCULAR | Status: AC
  Filled 2017-10-20: qty 1

## 2017-10-20 MED ORDER — MENTHOL 3 MG MT LOZG: 1.0000 | LOZENGE | OROMUCOSAL | Status: DC | PRN

## 2017-10-20 MED ORDER — ACETAMINOPHEN 650 MG RE SUPP: 650.0000 mg | RECTAL | Status: DC | PRN

## 2017-10-20 SURGICAL SUPPLY — 82 items
BAG DECANTER FOR FLEXI CONT (MISCELLANEOUS) ×2 IMPLANT
BENZOIN TINCTURE PRP APPL 2/3 (GAUZE/BANDAGES/DRESSINGS) ×2 IMPLANT
BLADE CLIPPER SURG (BLADE) IMPLANT
BLADE SURG 11 STRL SS (BLADE) IMPLANT
BONE VIVIGEN FORMABLE 10CC (Bone Implant) ×2 IMPLANT
BUR CUTTER 7.0 ROUND (BURR) ×2 IMPLANT
BUR MATCHSTICK NEURO 3.0 LAGG (BURR) ×2 IMPLANT
CANISTER SUCT 3000ML PPV (MISCELLANEOUS) ×2 IMPLANT
CAP LOCKING CREO THRD (Cap) ×6 IMPLANT
CARTRIDGE OIL MAESTRO DRILL (MISCELLANEOUS) ×1 IMPLANT
CEMENT KYPHON C01A KIT/MIXER (Cement) ×2 IMPLANT
CLAMP ROD TO ROD 5.5-5.5WIDE (Clamp) ×4 IMPLANT
CONT SPEC 4OZ CLIKSEAL STRL BL (MISCELLANEOUS) ×2 IMPLANT
COVER BACK TABLE 60X90IN (DRAPES) ×2 IMPLANT
DECANTER SPIKE VIAL GLASS SM (MISCELLANEOUS) ×2 IMPLANT
DERMABOND ADVANCED (GAUZE/BANDAGES/DRESSINGS) ×1
DERMABOND ADVANCED .7 DNX12 (GAUZE/BANDAGES/DRESSINGS) ×1 IMPLANT
DIFFUSER DRILL AIR PNEUMATIC (MISCELLANEOUS) ×2 IMPLANT
DRAPE C-ARM 42X72 X-RAY (DRAPES) ×2 IMPLANT
DRAPE C-ARMOR (DRAPES) ×2 IMPLANT
DRAPE HALF SHEET 40X57 (DRAPES) IMPLANT
DRAPE LAPAROTOMY 100X72X124 (DRAPES) ×2 IMPLANT
DRAPE SURG 17X23 STRL (DRAPES) ×2 IMPLANT
DRIVER SHAFT CREO (MISCELLANEOUS) ×12 IMPLANT
DRSG OPSITE 4X5.5 SM (GAUZE/BANDAGES/DRESSINGS) ×2 IMPLANT
DRSG OPSITE POSTOP 4X10 (GAUZE/BANDAGES/DRESSINGS) ×2 IMPLANT
DRSG OPSITE POSTOP 4X6 (GAUZE/BANDAGES/DRESSINGS) ×2 IMPLANT
DURAPREP 26ML APPLICATOR (WOUND CARE) ×2 IMPLANT
ELECT REM PT RETURN 9FT ADLT (ELECTROSURGICAL) ×2
ELECTRODE REM PT RTRN 9FT ADLT (ELECTROSURGICAL) ×1 IMPLANT
EVACUATOR 3/16  PVC DRAIN (DRAIN) ×1
EVACUATOR 3/16 PVC DRAIN (DRAIN) ×1 IMPLANT
GAUZE SPONGE 4X4 12PLY STRL (GAUZE/BANDAGES/DRESSINGS) ×2 IMPLANT
GAUZE SPONGE 4X4 16PLY XRAY LF (GAUZE/BANDAGES/DRESSINGS) IMPLANT
GLOVE BIO SURGEON STRL SZ7 (GLOVE) ×2 IMPLANT
GLOVE BIO SURGEON STRL SZ8 (GLOVE) ×4 IMPLANT
GLOVE BIOGEL PI IND STRL 7.0 (GLOVE) ×2 IMPLANT
GLOVE BIOGEL PI IND STRL 7.5 (GLOVE) ×1 IMPLANT
GLOVE BIOGEL PI INDICATOR 7.0 (GLOVE) ×2
GLOVE BIOGEL PI INDICATOR 7.5 (GLOVE) ×1
GLOVE ECLIPSE 7.5 STRL STRAW (GLOVE) IMPLANT
GLOVE EXAM NITRILE LRG STRL (GLOVE) IMPLANT
GLOVE EXAM NITRILE XL STR (GLOVE) IMPLANT
GLOVE EXAM NITRILE XS STR PU (GLOVE) IMPLANT
GLOVE INDICATOR 8.5 STRL (GLOVE) ×4 IMPLANT
GLOVE SURG SS PI 7.0 STRL IVOR (GLOVE) ×6 IMPLANT
GOWN STRL REUS W/ TWL LRG LVL3 (GOWN DISPOSABLE) ×2 IMPLANT
GOWN STRL REUS W/ TWL XL LVL3 (GOWN DISPOSABLE) ×2 IMPLANT
GOWN STRL REUS W/TWL 2XL LVL3 (GOWN DISPOSABLE) IMPLANT
GOWN STRL REUS W/TWL LRG LVL3 (GOWN DISPOSABLE) ×2
GOWN STRL REUS W/TWL XL LVL3 (GOWN DISPOSABLE) ×2
GRAFT BN 10X1XDBM MAGNIFUSE (Bone Implant) ×1 IMPLANT
GRAFT BONE MAGNIFUSE 1X10CM (Bone Implant) ×1 IMPLANT
HEMOSTAT POWDER KIT SURGIFOAM (HEMOSTASIS) IMPLANT
KIT BASIN OR (CUSTOM PROCEDURE TRAY) ×2 IMPLANT
KIT INFUSE MEDIUM (Orthopedic Implant) ×2 IMPLANT
KIT TURNOVER KIT B (KITS) ×2 IMPLANT
LOCKING CAP CREO THRD (Cap) ×12 IMPLANT
MILL MEDIUM DISP (BLADE) IMPLANT
NEEDLE HYPO 21X1.5 SAFETY (NEEDLE) ×2 IMPLANT
NEEDLE HYPO 25X1 1.5 SAFETY (NEEDLE) ×2 IMPLANT
NS IRRIG 1000ML POUR BTL (IV SOLUTION) ×6 IMPLANT
OIL CARTRIDGE MAESTRO DRILL (MISCELLANEOUS) ×2
PACK AFFIRM FILLER DELIVERY (MISCELLANEOUS) ×2 IMPLANT
PACK LAMINECTOMY NEURO (CUSTOM PROCEDURE TRAY) ×2 IMPLANT
PAD ARMBOARD 7.5X6 YLW CONV (MISCELLANEOUS) ×4 IMPLANT
RASP 3.0MM (RASP) ×2 IMPLANT
ROD 5.5 (Rod) ×4 IMPLANT
SCREW CREO POLYAXIAL 6.5X40 (Screw) ×8 IMPLANT
SCREW PA CREO THRD 5.5X40 (Screw) ×4 IMPLANT
SPONGE LAP 4X18 RFD (DISPOSABLE) IMPLANT
SPONGE SURGIFOAM ABS GEL 100 (HEMOSTASIS) ×2 IMPLANT
STRIP CLOSURE SKIN 1/2X4 (GAUZE/BANDAGES/DRESSINGS) ×2 IMPLANT
SUT VIC AB 0 CT1 18XCR BRD8 (SUTURE) ×2 IMPLANT
SUT VIC AB 0 CT1 8-18 (SUTURE) ×2
SUT VIC AB 2-0 CT1 18 (SUTURE) ×4 IMPLANT
SUT VIC AB 4-0 PS2 27 (SUTURE) ×2 IMPLANT
SYR 20CC LL (SYRINGE) ×2 IMPLANT
TOWEL GREEN STERILE (TOWEL DISPOSABLE) ×2 IMPLANT
TOWEL GREEN STERILE FF (TOWEL DISPOSABLE) IMPLANT
TRAY FOLEY MTR SLVR 16FR STAT (SET/KITS/TRAYS/PACK) ×2 IMPLANT
WATER STERILE IRR 1000ML POUR (IV SOLUTION) ×2 IMPLANT

## 2017-10-20 NOTE — Progress Notes (Signed)
Pharmacy Antibiotic Note  Bryan [name] is a 77 y.o. male admitted on 10/19/2017 for L/T displaced hardware / revison.  [facility] has been consulted for vancomycin  dosing. Vancomycin 1gm x1 given at 1500 in OR Crcl 76ml/min Cr 1.1 WBC 3.8  Plan: Vancomycin 1gm IV q1hr  Height: 5\' 11"  (180.3 cm) Weight: 186 lb 8.2 oz (84.6 kg) IBW/kg (Calculated) : 75.3  Temp (24hrs), Avg:98.1 F (36.7 C), Min:97.3 F (36.3 C), Max:98.6 F (37 C)  Recent Labs  Lab 10/20/17 1519  WBC 3.8*  CREATININE 1.12    Estimated Creatinine Clearance: 57 mL/min (by C-G formula based on SCr of 1.12 mg/dL).    Allergies  Allergen Reactions  . Penicillins Hives, Swelling and Other (See Comments)    SWELLING REACTION UNSPECIFIED PATIENT HAS TAKEN AMOXICILLIN ON MED HX FROM [facility] PATIENT HAS HAD A PCN REACTION WITH IMMEDIATE RASH, FACIAL/TONGUE/THROAT SWELLING, SOB, OR LIGHTHEADEDNESS WITH HYPOTENSION:  #  #  #  YES  #  #  #   Has patient had a PCN reaction causing severe rash involving mucus membranes or skin necrosis: No Has patient had a PCN reaction that required hospitalization No Has patient had a PCN reaction occurring within the last 10 years: No  . Demerol [Meperidine] Hives and Nausea And Vomiting    Antimicrobials this admission:   Dose adjustments this admission:   Microbiology results:   Bonnita Nasuti Pharm.D. CPP, BCPS Clinical Pharmacist [phone] 10/20/2017 9:34 PM

## 2017-10-20 NOTE — NC FL2 (Signed)
[place] MEDICAID FL2 LEVEL OF CARE SCREENING TOOL     IDENTIFICATION  Patient Name: Bryan [name] Birthdate: [birth year] Sex: male Admission Date (Current Location): 10/19/2017  [facility] and [place] Number:  Engineering geologist and Address:  [facility], [place] [address] [place], [place] [address]      Provider Number: [id number]  Attending Physician Name and Address:  Kary Kos, MD  Relative Name and Phone Number:  Finnean Cerami, wife, [phone]    Current Level of Care: Hospital Recommended Level of Care: [place] Prior Approval Number:    Date Approved/Denied:   PASRR Number: [id number]  Discharge Plan: SNF    Current Diagnoses: Patient Active Problem List   Diagnosis Date Noted  . Pseudoarthrosis of lumbar spine 09/28/2017  . Anxiety 06/24/2017  . Lumbar burst fracture, closed, initial encounter ([place]) 06/08/2017  . Conjunctivitis, bacterial 03/22/2017  . Urge incontinence 12/15/2016  . Ataxia 11/09/2016  . Insomnia 11/09/2016  . Transient memory loss 11/09/2016  . Nausea and vomiting 10/15/2016  . Spinal stenosis of lumbar region 10/14/2016  . Leukopenia 09/27/2016  . Thrombocytopenia ([place]) 09/27/2016  . Other pancytopenia ([place]) 09/27/2016  . History of left knee replacement 09/01/2016  . Fall 09/01/2016  . HNP (herniated nucleus pulposus with myelopathy), thoracic 03/08/2016  . Rectal nodule 02/23/2016  . DDD (degenerative disc disease), lumbar 01/02/2016  . Lumbar radiculopathy 01/02/2016  . Facet syndrome, lumbar 01/02/2016  . Greater trochanteric bursitis 11/28/2015  . Acute renal failure ([place]) 11/06/2015  . Dehydration 10/17/2015  . IBS (irritable bowel syndrome) 07/15/2015  . Erectile dysfunction of organic origin 07/15/2015  . Carotid stenosis 05/06/2015  . Difficulty sleeping 04/15/2015  . 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 Chronic kidney disease 02/17/2015  . Hereditary and idiopathic  neuropathy 02/17/2015  . 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  . Enlarged prostate with lower urinary tract symptoms (LUTS) 02/17/2015  . Rheumatoid arthritis ([place]) 10/01/2014  . Sacroiliac joint disease 10/01/2014  . Fusion of spine, sacral and sacrococcygeal region 10/01/2014  . DDD (degenerative disc disease), cervical 10/01/2014  . Lumbar and sacral osteoarthritis 10/01/2014  . Mechanical and motor problems with internal organs 06/20/2014  . Iron deficiency anemia 06/19/2014  . Anemia 06/19/2014  . Aneurysm, ascending aorta ([place]) 05/27/2014  . Aneurysm of thoracic aorta ([place]) 05/27/2014  . Thoracic aortic aneurysm without rupture ([place]) 05/27/2014  . Cognitive decline 04/01/2014  . Spinal stenosis of lumbar region with radiculopathy 01/30/2014  . Vitamin D deficiency 01/30/2014  . Dorsalgia, unspecified 01/30/2014  . Lumbar canal stenosis 01/30/2014  . Paroxysmal atrial fibrillation ([name]) 11/27/2013  . S/P AVR (aortic valve replacement) 11/27/2013  . History of ascending aorta repair 11/27/2013  . Hyperlipidemia 11/27/2013  . Essential hypertension 11/27/2013  . Bicuspid aortic valve 11/27/2013  . Atherosclerotic heart disease of native coronary artery without angina pectoris 11/27/2013  . 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 History of open heart surgery 11/27/2013  . Carotid artery insufficiency syndrome 11/06/2010    Orientation RESPIRATION BLADDER Height & Weight     Time, Self, Place, Situation  Normal Continent Weight: 84.6 kg (186 lb 8.2 oz) Height:  5\' 11"  (180.3 cm)  BEHAVIORAL SYMPTOMS/MOOD NEUROLOGICAL BOWEL NUTRITION STATUS      Continent Diet(Please see DC Summary)  AMBULATORY STATUS COMMUNICATION OF NEEDS Skin   Limited Assist Verbally Surgical wounds(Closed incision on back)  Personal Care Assistance Level of Assistance    Bathing Assistance: Limited assistance Feeding assistance: Independent Dressing Assistance:  Limited assistance     Functional Limitations Info  Sight, Hearing, Speech Sight Info: Adequate Hearing Info: Adequate Speech Info: Adequate    SPECIAL CARE FACTORS FREQUENCY  PT (By licensed PT), OT (By licensed OT)     PT Frequency: 5x week  OT Frequency: 3x/week            Contractures Contractures Info: Not present    Additional Factors Info  Code Status, Allergies, Psychotropic Code Status Info: Full Allergies Info: Penicillins, Demerol Meperidine           Current Medications (10/20/2017):  This is the current hospital active medication list Current Facility-Administered Medications  Medication Dose Route Frequency Provider Last Rate Last Dose  . 0.9 %  sodium chloride infusion  250 mL Intravenous Continuous Kary Kos, MD 1 mL/hr at 10/19/17 2259 250 mL at 10/19/17 2259  . acetaminophen (TYLENOL) tablet 650 mg  650 mg Oral Q4H PRN Kary Kos, MD       Or  . acetaminophen (TYLENOL) suppository 650 mg  650 mg Rectal Q4H PRN Kary Kos, MD      . alum & mag hydroxide-simeth (MAALOX/MYLANTA) 200-200-20 MG/5ML suspension 30 mL  30 mL Oral Q6H PRN Kary Kos, MD      . Chlorhexidine Gluconate Cloth 2 % PADS 6 each  6 each Topical Once Kary Kos, MD       And  . Chlorhexidine Gluconate Cloth 2 % PADS 6 each  6 each Topical Once Kary Kos, MD      . cyclobenzaprine (FLEXERIL) tablet 10 mg  10 mg Oral TID PRN Kary Kos, MD      . dexamethasone (DECADRON) injection 10 mg  10 mg Intravenous To OR Kary Kos, MD      . famotidine (PEPCID) IVPB 20 mg premix  20 mg Intravenous Q12H Kary Kos, MD   Stopped at 10/20/17 1304  . feeding supplement (ENSURE ENLIVE) (ENSURE ENLIVE) liquid 237 mL  237 mL Oral BID BM Kary Kos, MD   237 mL at 10/20/17 1000  . HYDROmorphone (DILAUDID) injection 1 mg  1 mg Intravenous Q2H PRN Kary Kos, MD      . HYDROmorphone (DILAUDID) tablet 4 mg  4 mg Oral Q4H PRN Kary Kos, MD   4 mg at 10/20/17 1046  . menthol-cetylpyridinium (CEPACOL)  lozenge 3 mg  1 lozenge Oral PRN Kary Kos, MD       Or  . phenol (CHLORASEPTIC) mouth spray 1 spray  1 spray Mouth/Throat PRN Kary Kos, MD      . ondansetron [facility]) tablet 4 mg  4 mg Oral Q6H PRN Kary Kos, MD       Or  . ondansetron [facility]) injection 4 mg  4 mg Intravenous Q6H PRN Kary Kos, MD      . sodium chloride flush (NS) 0.9 % injection 3 mL  3 mL Intravenous Q12H Kary Kos, MD   3 mL at 10/20/17 1001  . sodium chloride flush (NS) 0.9 % injection 3 mL  3 mL Intravenous PRN Kary Kos, MD      . vancomycin (VANCOCIN) IVPB 1000 mg/200 mL premix  1,000 mg Intravenous To OR Kary Kos, MD         Discharge Medications: Please see discharge summary for a list of discharge medications.  Relevant Imaging Results:  Relevant Lab Results:   Additional Information [id number]  [id number]  Benard Halsted, LCSWA

## 2017-10-20 NOTE — Anesthesia Preprocedure Evaluation (Addendum)
Anesthesia Evaluation  Patient identified by MRN, date of birth, ID band Patient awake    Reviewed: Allergy & Precautions, NPO status , Patient's Chart, lab work & pertinent test results  Airway Mallampati: II  TM Distance: >3 FB Neck ROM: Full    Dental  (+) Dental Advisory Given, Teeth Intact, Chipped   Pulmonary former smoker,    breath sounds clear to auscultation       Cardiovascular hypertension, Pt. on medications + CAD and + Peripheral Vascular Disease (s/p AVR, thoracic aneurysm repair)  + dysrhythmias Atrial Fibrillation + Valvular Problems/Murmurs (s/p AVR (bicuspid valve)) AS  Rhythm:Regular Rate:Normal  '17 TTE - LV cavity size was mildly dilated. EF 50% to 55%. Grade 2 diastolic dysfunction. Bioprosthetic AV with moderate stenosis. Mean gradient (S): 20 mmHg. Valve area (VTI): 1.24 cm^2. Aortic root dimension: 44 mm (ED). The aortic root was moderately dilated. Mild MR, moderately dilated LA. Mildly dilated RA. PASP was moderately increased. PA peak pressure: 53 mmHg   '09 cardiac cath: mild to moderate CAD  S/p thoracic artery aneurysm repair and AVR   Neuro/Psych PSYCHIATRIC DISORDERS Anxiety Depression Schizophrenia  Chronic back pain with lumbar spinal stenosis, memory loss     GI/Hepatic Neg liver ROS, GERD  Medicated and Controlled, IBS    Endo/Other  negative endocrine ROS  Renal/GU Renal InsufficiencyRenal disease     Musculoskeletal  (+) Arthritis , Rheumatoid disorders,    Abdominal   Peds  Hematology  (+) Blood dyscrasia (h/o thrombocytopenia: plt 123k), anemia ,   Anesthesia Other Findings RA  Reproductive/Obstetrics                           Anesthesia Physical  Anesthesia Plan  ASA: III  Anesthesia Plan: General   Post-op Pain Management:    Induction: Intravenous  PONV Risk Score and Plan: 3 and Ondansetron, Treatment may vary due to age or medical  condition and Dexamethasone  Airway Management Planned: Oral ETT  Additional Equipment: None  Intra-op Plan:   Post-operative Plan: Extubation in OR  Informed Consent: I have reviewed the patients History and Physical, chart, labs and discussed the procedure including the risks, benefits and alternatives for the proposed anesthesia with the patient or authorized representative who has indicated his/her understanding and acceptance.   Dental advisory given  Plan Discussed with: CRNA and Anesthesiologist  Anesthesia Plan Comments:         Anesthesia Quick Evaluation

## 2017-10-20 NOTE — Progress Notes (Signed)
Nutrition Brief Note  Patient identified on the Malnutrition Screening Tool (MST) Report  Wt Readings from Last 15 Encounters:  10/19/17 186 lb 8.2 oz (84.6 kg)  09/28/17 185 lb 6.5 oz (84.1 kg)  09/20/17 181 lb 12.8 oz (82.5 kg)  06/27/17 174 lb (78.9 kg)  06/24/17 183 lb (83 kg)  06/20/17 178 lb (80.7 kg)  06/08/17 178 lb (80.7 kg)  06/06/17 178 lb 6.4 oz (80.9 kg)  04/21/17 174 lb 1.6 oz (79 kg)  03/21/17 181 lb 8 oz (82.3 kg)  01/31/17 179 lb 6.4 oz (81.4 kg)  01/26/17 177 lb (80.3 kg)  01/03/17 180 lb 14.4 oz (82.1 kg)  12/17/16 181 lb 6 oz (82.3 kg)  12/15/16 179 lb (81.2 kg)    Body mass index is 26.01 kg/m. Patient meets criteria for overweight based on current BMI. Pt is currently NPO for anticipation of surgery today. Pt reports appetite is good with usual consumption of at least 2-3 meals a day with no other difficulties. Weight has been stable. Labs and medications reviewed. Pt currently has Ensure ordered per MD. Will continue with current orders. RN to provide supplement once diet advances.   No further nutrition interventions warranted at this time. If nutrition issues arise, please consult RD.   Corrin Parker, MS, RD, LDN Pager # [phone] After hours/ weekend pager # [phone]

## 2017-10-20 NOTE — Consult Note (Signed)
   [facility] CM Inpatient Consult   10/20/2017  Bryan [name] Oct 09, 1940 [id number]   Patient screened for readmission for post op screws and rods displacement per MD notes [facility] needs in the [employer] Medicare plan.   Please place a [facility] Care Management consult or for questions contact:   Natividad Brood, RN BSN [facility] Liaison  [phone] business mobile phone Toll free office [phone]

## 2017-10-20 NOTE — Transfer of Care (Signed)
Immediate Anesthesia Transfer of Care Note  Patient: Bryan [name]  Procedure(s) Performed: Revision fusion and removal of hardware Lumbar one, Pedicle screw fixation thoracic ten-lumbar two, thoracic nine-ten laminotomy, Posterior Lumbar Arthrodesis thoracic ten-lumbar two  (N/A Back)  Patient Location: PACU  Anesthesia Type:General  Level of Consciousness: awake  Airway & Oxygen Therapy: Patient Spontanous Breathing and Patient connected to face mask oxygen  Post-op Assessment:   Post vital signs: Reviewed and stable  Last Vitals:  Vitals Value Taken Time  BP 158/86 10/20/2017  7:25 PM  Temp    Pulse 84 10/20/2017  7:29 PM  Resp 21 10/20/2017  7:29 PM  SpO2 94 % 10/20/2017  7:29 PM  Vitals shown include unvalidated device data.  Last Pain:  Vitals:   10/20/17 1140  TempSrc: Oral  PainSc:          Complications: No apparent anesthesia complications

## 2017-10-20 NOTE — Op Note (Signed)
Preoperative diagnosis: L1 burst fracture displaced hardware and instability T12-L1 and L1-L2  Postoperative diagnosis: Same  Procedure: Revision fusion removal hardware with cutting the rod between the L1 and L2 screws and removal of loose bilateral L1 screws  #2 pedicle screw fixation T10-L2 utilizing the globus fenestrated pedicle screws at T10 T11 T12 bilaterally and a globus addition set to tie into the rods below the L2 screw with placement of Kyphon methylmethacrylate through the fenestrated screws at T10, T11 and T12 bilaterally  #3 laminotomy at T9-10 for inspection and evaluation of possible medial breech with methylmethacrylate from the right T10 screw  #4 posterior lateral arthrodesis T10-L2 utilizing BMP,vivigen and magnafuse  Surgeon: Dr. Kary Kos  Assistant: Dr. Sherley Bounds  Anesthesia: Gen.  EBL: Minimal  History of present illness: 77 year old gentleman who presented on an L1-S1 revision surgery and presented with increased back pain and noted to have his L1 screws loose and pulled out. I deformity at T12-L1. Due to progression of critical syndrome imaging findings and recommended revision of fusion with extension up to T10. Due to his severe osteopenia and osteoporosis I recommended utilizing fenestrated screws were with methylmethacrylate injected through the screw. I extensively the risks and benefits of this operation with him as well as perioperative course and expectations of outcome returned to surgery and he understood and agreed to proceed forward.  Operative procedure: Patient There was decent general anesthesia and positioned prone on the [place] table his back was prepped and draped in routine sterile fashion his old incision was opened up and extended superiorly and subperiosteal dissection was carried out with Bovie cautery exposing the T10, T11, T12 and into the hardware below the L2 screw. Then utilizing AP and lateral fluoroscopy pilot holes were drilled  probed and tapped cannulated with the awl and 5 5 x 40 screws were inserted at T10 bilaterally and 6 5 x 40 screws were inserted at T 11 and T12 bilaterally the third millimeter length was made based on preoperative measurements. After all 6 screws and placed utilizing the Kyphon cement the cement was introduced into the pedicle screws. As this was being introduced towards and through the right T10 pedicle screw the fluoroscopy looked like some of the methylmethacrylate had been displaced laterally in the lateral aspect of the canal. All the other screw placement cemented with adequate placement seemed to be in good position within the vertebral body. So because of this I elected to perform a laminotomy on the right at T9-10. This was performed and I extended this to well above the T9-T10 disc space and down to the level below the T10 pedicle. Inspected this I could visualize methylmethacrylate in the lateral aspect of the canal however was not compressing the thoracic spinal cord. I did perform the decompressive laminectomy primarily on the right but also did some sublaminar decompression across to the left side to ensure adequate thoracic cord decompression at that level did not feel that likely was causing any compression did not be drilled down. I was able to pass a probe underneath the thecal sac across the midline and I feel that only breech was on the very medial aspect of the pedicle and therefore laterally in the canal and not centrally. So this is not the Gelfoam and then I contoured the rod and 2 connectors and placed the connectors invaded the L2 screw contoured to see rod and cut it in place and inserted. Anchoring all the nuts in place and tightened everything down and then  the wound was closely irrigated the consisting states was maintained X Burrow was injected in the fascia vancomycin was sprinkled into the the wound after aggressive decortication was carried on the lamina complexes from T10 down to  L2 and the BMP,vivigen and magnafuse was packed posterior laterally from T10 down to below L2. Then at this point a drain was placed and the wound was closed in layers with interrupted Vicryl and a running 4 subcuticular dermal benzoin Steri-Strips and a sterile dressing was applied patient to recovery room in stable condition. At and of the case all needle counts and sponge counts were correct.

## 2017-10-20 NOTE — Anesthesia Procedure Notes (Signed)
Procedure Name: Intubation Date/Time: 10/20/2017 3:46 PM Performed by: Jenne Campus, CRNA Pre-anesthesia Checklist: Patient identified, Emergency Drugs available, Suction available and Patient being monitored Patient Re-evaluated:Patient Re-evaluated prior to induction Oxygen Delivery Method: Circle System Utilized Preoxygenation: Pre-oxygenation with 100% oxygen Induction Type: IV induction Ventilation: Mask ventilation without difficulty Laryngoscope Size: Miller and 3 Grade View: Grade I Tube type: Oral Tube size: 7.5 mm Number of attempts: 1 Airway Equipment and Method: Stylet and Oral airway Placement Confirmation: ETT inserted through vocal cords under direct vision,  positive ETCO2 and breath sounds checked- equal and bilateral Secured at: 22 cm Tube secured with: Tape Dental Injury: Teeth and Oropharynx as per pre-operative assessment  Difficulty Due To: Difficult Airway-  due to neck instability

## 2017-10-21 ENCOUNTER — Encounter (HOSPITAL_COMMUNITY): Payer: Self-pay | Admitting: Neurosurgery

## 2017-10-21 MED ORDER — OXYCODONE-ACETAMINOPHEN 5-325 MG PO TABS
1.0000 | ORAL_TABLET | ORAL | Status: DC | PRN
  Administered 2017-10-21: 2 via ORAL
  Administered 2017-10-21: 1 via ORAL
  Administered 2017-10-22 – 2017-10-24 (×9): 2 via ORAL
  Filled 2017-10-21 (×11): qty 2

## 2017-10-21 MED ORDER — FAMOTIDINE 20 MG PO TABS
20.0000 mg | ORAL_TABLET | Freq: Two times a day (BID) | ORAL | Status: DC
  Administered 2017-10-21 – 2017-10-24 (×7): 20 mg via ORAL
  Filled 2017-10-21 (×7): qty 1

## 2017-10-21 MED FILL — Thrombin For Soln 20000 Unit: CUTANEOUS | Qty: 1 | Status: AC

## 2017-10-21 NOTE — Progress Notes (Signed)
OT Cancellation Note  Patient Details Name: Bryan [name] MRN: [id number] DOB: 1940-06-11   Cancelled Treatment:    Reason Eval/Treat Not Completed: (Pt with pain and awaiting arrival of his back brace.) Will follow.  Malka So 10/21/2017, 12:19 PM  10/21/2017 Nestor Lewandowsky, OTR/L Pager: [phone]

## 2017-10-21 NOTE — Progress Notes (Signed)
Subjective: Patient reports mild back pain. Pain medicine keeping it under control. Denies any leg pain Objective: Vital signs in last 24 hours: Temp:  [97.3 F (36.3 C)-98.4 F (36.9 C)] 98 F (36.7 C) (05/31 0800) Pulse Rate:  [81-101] 101 (05/31 0800) Resp:  [10-25] 25 (05/31 0800) BP: (143-172)/(66-90) 172/81 (05/31 0800) SpO2:  [91 %-99 %] 97 % (05/31 0800) Weight:  [84.6 kg (186 lb 8.2 oz)] 84.6 kg (186 lb 8.2 oz) (05/30 1502)  Intake/Output from previous day: 05/30 0701 - 05/31 0700 In: 1600 [I.V.:1600] Out: 820 [Urine:520; Drains:100; Blood:200] Intake/Output this shift: Total I/O In: 65 [Other:65] Out: -   Neurologic: Grossly normal  Lab Results: Lab Results  Component Value Date   WBC 3.8 (L) 10/20/2017   HGB 9.0 (L) 10/20/2017   HCT 28.2 (L) 10/20/2017   MCV 86.2 10/20/2017   PLT 263 10/20/2017   Lab Results  Component Value Date   INR 1.01 01/26/2017   BMET Lab Results  Component Value Date   NA 136 10/20/2017   K 4.0 10/20/2017   CL 99 (L) 10/20/2017   CO2 25 10/20/2017   GLUCOSE 92 10/20/2017   BUN 22 (H) 10/20/2017   CREATININE 1.12 10/20/2017   CALCIUM 9.5 10/20/2017    Studies/Results: Dg Thoracic Spine 2 View  Result Date: 10/20/2017 CLINICAL DATA:  Portable operative thoracic spine imaging obtained for T9-T10 through T11-T12 fusion. EXAM: THORACIC SPINE 2 VIEWS; DG C-ARM 61-120 MIN COMPARISON:  None. FINDINGS: Two submitted images show pedicle screws in 3 contiguous vertebra where there is vertebroplasty cement. IMPRESSION: Imaging submitted for fusion of the lower thoracic spine. Electronically Signed   By: Lajean Manes M.D.   On: 10/20/2017 20:03   Ct Thoracic Spine Wo Contrast  Result Date: 10/19/2017 CLINICAL DATA:  77 y/o M; preop scan. T10-L2 revision operation with removal of L1 screws. EXAM: CT THORACIC SPINE WITHOUT CONTRAST TECHNIQUE: Multidetector CT images of the thoracic were obtained using the standard protocol without  intravenous contrast. COMPARISON:  07/30/2016 CT of chest. 10/11/2017 lumbar spine radiographs. FINDINGS: Alignment: Normal. Vertebrae: L1 vertebral body superior endplate fracture and compression deformity involving anterior and middle columns with 40% loss of vertebral body height and minimal 2 mm retropulsion of superior endplate. Posterior instrumentation fusion hardware is partially visualized with pedicle screws in the L1 and L2 vertebral bodies. There is morselized bone graft material along the posterior elements at levels of fusion. Bones are demineralized. Paraspinal and other soft tissues: Coronary artery calcification. Aortic valve replacement. Small hiatal hernia. There are few small branching metallic densities within the right lung apex along the bronchovascular bundles, probably cement/foreign body emboli (series 5, image 13). Disc levels: Mild multilevel discogenic degenerative changes with small marginal osteophytes. No high-grade bony foraminal or canal stenosis. IMPRESSION: 1. Stable 40% L1 compression deformity involving anterior and middle columns. No new acute fracture identified. 2. Partially visualized lumbar fusion hardware from L1 extending below field of view. Electronically Signed   By: Kristine Garbe M.D.   On: 10/19/2017 22:18   Dg C-arm 1-60 Min  Result Date: 10/20/2017 CLINICAL DATA:  Portable operative thoracic spine imaging obtained for T9-T10 through T11-T12 fusion. EXAM: THORACIC SPINE 2 VIEWS; DG C-ARM 61-120 MIN COMPARISON:  None. FINDINGS: Two submitted images show pedicle screws in 3 contiguous vertebra where there is vertebroplasty cement. IMPRESSION: Imaging submitted for fusion of the lower thoracic spine. Electronically Signed   By: Lajean Manes M.D.   On: 10/20/2017 20:03   Dg  C-arm 1-60 Min  Result Date: 10/20/2017 CLINICAL DATA:  Portable operative thoracic spine imaging obtained for T9-T10 through T11-T12 fusion. EXAM: THORACIC SPINE 2 VIEWS; DG  C-ARM 61-120 MIN COMPARISON:  None. FINDINGS: Two submitted images show pedicle screws in 3 contiguous vertebra where there is vertebroplasty cement. IMPRESSION: Imaging submitted for fusion of the lower thoracic spine. Electronically Signed   By: Lajean Manes M.D.   On: 10/20/2017 20:03    Assessment/Plan: Doing well. Will plan to work with therapy today and get him up with the brace   LOS: 2 days    Ocie Cornfield Danis Pembleton 10/21/2017, 9:55 AM

## 2017-10-21 NOTE — Care Management Note (Signed)
Case Management Note  Patient Details  Name: BLAIKE VICKERS MRN: [id number] Date of Birth: 10/09/1940  Subjective/Objective:   pt is a 77 y/o male with h/o RA, HTN, neuropathy, CAD and multiple lumbar surgeries, admitted from SNF rehab with progressively worsening back pain shown to be due to a pulled out L1 screws and rods that were displaced.  Pt s/p revision of recent fusion, pedicle screw fixation T10-L2 and PLA T10-L2.                  Action/Plan: CSW consulted to facilitate return to SNF upon medical stability.  Will follow progress.  Expected Discharge Date:                  Expected Discharge Plan:  Skilled Nursing Facility  In-House Referral:  Clinical Social Work  Discharge planning Services  CM Consult  Post Acute Care Choice:    Choice offered to:     DME Arranged:    DME Agency:     HH Arranged:    [place] Agency:     Status of Service:  In process, will continue to follow  If discussed at Long Length of Stay Meetings, dates discussed:    Additional Comments:  Ella Bodo, RN 10/21/2017, 5:29 PM

## 2017-10-21 NOTE — Progress Notes (Signed)
CSW received consult that patient requests SNF placement. CSW notes patient went to [facility] from 10/01/17-10/14/17. CSW checking to see if patient has more SNF days available.   Percell Locus Brodan Grewell LCSW [phone]

## 2017-10-21 NOTE — Progress Notes (Signed)
MD office paged, NP returned call to adjust pain medication. Continue to monitor patient pain.

## 2017-10-21 NOTE — Progress Notes (Signed)
Physical Therapy Treatment Patient Details Name: Bryan [name] MRN: [id number] DOB: Apr 27, 1941 Today's Date: 10/21/2017    History of Present Illness pt is a 77 y/o male with h/o RA, HTN, neuropathy, CAD and multiple lumbar surgeries, admitted from SNF rehab with progressively worsening back pain shown to be due to a pulled out L1 screws and rods that were displaced.  Pt s/p revision of recent fusion, pedicle screw fixation T10-L2 and PLA T10-L2.    PT Comments    Pt admitted with/for revision and extension of lumbar fusion.  Pt currently limited functionally due to the problems listed. ( See problems list.)   Pt will benefit from PT to maximize function and safety in order to get ready for next venue listed below.    Follow Up Recommendations  SNF     Equipment Recommendations  None recommended by PT    Recommendations for Other Services       Precautions / Restrictions Precautions Precautions: Back Required Braces or Orthoses: Spinal Brace Spinal Brace: Lumbar corset;Applied in sitting position    Mobility  Bed Mobility Overal bed mobility: Needs Assistance Bed Mobility: Rolling;Sidelying to Sit;Sit to Sidelying Rolling: Mod assist Sidelying to sit: Mod assist     Sit to sidelying: Mod assist General bed mobility comments: reinforced technique and assisted trunk and LE's  Transfers Overall transfer level: Needs assistance Equipment used: Rolling walker (2 wheeled) Transfers: Sit to/from Stand Sit to Stand: Min assist         General transfer comment: cues for safe hand placement. minor stability assist.  Ambulation/Gait Ambulation/Gait assistance: Min guard Ambulation Distance (Feet): 250 Feet Assistive device: Rolling walker (2 wheeled) Gait Pattern/deviations: Step-through pattern Gait velocity: slower Gait velocity interpretation: 1.31 - 2.62 ft/sec, indicative of limited community ambulator General Gait Details: mildly flexed kneed gait, but  improving with distance though pt report pain and fatigue.   Stairs             Wheelchair Mobility    Modified Rankin (Stroke Patients Only)       Balance Overall balance assessment: Needs assistance   Sitting balance-Leahy Scale: Fair Sitting balance - Comments: too painful to donn brace today.   Standing balance support: No upper extremity supported;Single extremity supported Standing balance-Leahy Scale: Poor Standing balance comment: relied on the RW due to increased pain                            Cognition Arousal/Alertness: Awake/alert Behavior During Therapy: WFL for tasks assessed/performed Overall Cognitive Status: Within Functional Limits for tasks assessed                                        Exercises      General Comments General comments (skin integrity, edema, etc.): Reinforced back care/prec, bed mobility, lifting precautions and progression of activity.      Pertinent Vitals/Pain Pain Assessment: Faces Faces Pain Scale: Hurts whole lot Pain Location: back Pain Descriptors / Indicators: Sore;Guarding;Moaning Pain Intervention(s): Monitored during session;Premedicated before session    Home Living Family/patient expects to be discharged to:: Skilled nursing facility                    Prior Function Level of Independence: Independent with assistive device(s)(prior to the last surgery)          PT Goals (  current goals can now be found in the care plan section) Acute Rehab PT Goals Patient Stated Goal: go to [facility] to get better PT Goal Formulation: With patient Time For Goal Achievement: 10/28/17 Potential to Achieve Goals: Good    Frequency    Min 5X/week      PT Plan      Co-evaluation              AM-PAC PT "6 Clicks" Daily Activity  Outcome Measure  Difficulty turning over in bed (including adjusting bedclothes, sheets and blankets)?: Unable Difficulty moving from lying on back  to sitting on the side of the bed? : Unable Difficulty sitting down on and standing up from a chair with arms (e.g., wheelchair, bedside commode, etc,.)?: Unable Help needed moving to and from a bed to chair (including a wheelchair)?: A Little Help needed walking in hospital room?: A Little Help needed climbing 3-5 steps with a railing? : A Little 6 Click Score: 12    End of Session   Activity Tolerance: Patient tolerated treatment well Patient left: in bed;with call bell/phone within reach;with bed alarm set;with SCD's reapplied Nurse Communication: Mobility status PT Visit Diagnosis: Other abnormalities of gait and mobility (R26.89);Pain Pain - part of body: (back)     Time: 6789-3810 PT Time Calculation (min) (ACUTE ONLY): 34 min  Charges:  $Gait Training: 8-22 mins                    G Codes:       11-13-2017  Donnella Sham, PT [phone]  (pager)   Tessie Fass Chip Canepa 2017/11/13, 4:49 PM

## 2017-10-21 NOTE — Progress Notes (Signed)
Orthopedic Tech Progress Note Patient Details:  Bryan [name] 03-20-1941 [id number]  Patient ID: Bryan [name], male   DOB: 1940-07-14, 77 y.o.   MRN: [id number]   [name] 10/21/2017, 12:19 PM Called in bio-tech brace order; spoke with Judeen Hammans

## 2017-10-21 NOTE — Progress Notes (Signed)
Ortho tech paged to obtain new brace; per pt/family, MD said to get larger back brace. Current brace is not at hospital and family unable to bring in today.  Ortho tech to page biotech to [employer] for correct brace.  Continue to follow up.

## 2017-10-22 NOTE — Progress Notes (Signed)
Physical Therapy Treatment Patient Details Name: Bryan [name] MRN: [id number] DOB: 06/15/1940 Today's Date: 10/22/2017    History of Present Illness pt is a 77 y/o male with h/o RA, HTN, neuropathy, CAD and multiple lumbar surgeries, admitted from SNF rehab with progressively worsening back pain shown to be due to a pulled out L1 screws and rods that were displaced.  Pt s/p revision of recent fusion, pedicle screw fixation T10-L2 and PLA T10-L2.    PT Comments    Improving slowly, limited by pain and general stiffness   Follow Up Recommendations  SNF     Equipment Recommendations  None recommended by PT    Recommendations for Other Services       Precautions / Restrictions Precautions Precautions: Back Precaution Booklet Issued: Yes (comment) Precaution Comments: per MD, no need for bracing but pt wearing back brace Required Braces or Orthoses: Spinal Brace Spinal Brace: Lumbar corset;Applied in sitting position Restrictions Weight Bearing Restrictions: No    Mobility  Bed Mobility Overal bed mobility: Needs Assistance Bed Mobility: Rolling;Sidelying to Sit Rolling: Mod assist Sidelying to sit: Mod assist       General bed mobility comments: pt guarding so heavily with the pain that he has to use the rail and extra assist  Transfers Overall transfer level: Needs assistance Equipment used: Rolling walker (2 wheeled) Transfers: Sit to/from Stand Sit to Stand: Min assist         General transfer comment: cues for safe hand placement  Ambulation/Gait Ambulation/Gait assistance: Min guard Ambulation Distance (Feet): 180 Feet Assistive device: Rolling walker (2 wheeled) Gait Pattern/deviations: Step-through pattern Gait velocity: slower   General Gait Details: flexed kneed gait with flexed posture and generally guarded with pain.   Stairs             Wheelchair Mobility    Modified Rankin (Stroke Patients Only)       Balance Overall  balance assessment: Needs assistance Sitting-balance support: No upper extremity supported Sitting balance-Leahy Scale: Fair Sitting balance - Comments: needed UE's for support against the pain and not able to donn brace without assist   Standing balance support: No upper extremity supported;Single extremity supported Standing balance-Leahy Scale: Poor Standing balance comment: relied on the RW due to increased pain                            Cognition Arousal/Alertness: Awake/alert Behavior During Therapy: WFL for tasks assessed/performed Overall Cognitive Status: Within Functional Limits for tasks assessed                                        Exercises      General Comments General comments (skin integrity, edema, etc.): Reinforced back care/prec, bed mobility, lifting precautions and progression of activity.      Pertinent Vitals/Pain Pain Assessment: 0-10 Pain Score: 8  Faces Pain Scale: Hurts whole lot Pain Location: back Pain Descriptors / Indicators: Sore;Guarding;Moaning Pain Intervention(s): Limited activity within patient's tolerance    Home Living Family/patient expects to be discharged to:: Skilled nursing facility Living Arrangements: Spouse/significant other Available Help at Discharge: Family;Available PRN/intermittently(wife being assisted by dtr temporarily,  wife can't help pt.) Type of Home: Other(Comment)(condo) Home Access: Level entry   Home Layout: One level Home Equipment: Grab bars - tub/shower;Shower seat;Bedside commode;Walker - 2 wheels;Cane - single point;Adaptive equipment  Prior Function Level of Independence: Independent with assistive device(s)(prior to the last surgery)      Comments: Pt is retired Software engineer.  pt ambulates with use of SPC,sometimes RW, and independent with ADLs   PT Goals (current goals can now be found in the care plan section) Acute Rehab PT Goals Patient Stated Goal: to get more  rehab PT Goal Formulation: With patient Time For Goal Achievement: 10/28/17 Potential to Achieve Goals: Good Progress towards PT goals: Progressing toward goals    Frequency    Min 5X/week      PT Plan Current plan remains appropriate    Co-evaluation              AM-PAC PT "6 Clicks" Daily Activity  Outcome Measure  Difficulty turning over in bed (including adjusting bedclothes, sheets and blankets)?: Unable Difficulty moving from lying on back to sitting on the side of the bed? : Unable Difficulty sitting down on and standing up from a chair with arms (e.g., wheelchair, bedside commode, etc,.)?: Unable Help needed moving to and from a bed to chair (including a wheelchair)?: A Little Help needed walking in hospital room?: A Little Help needed climbing 3-5 steps with a railing? : A Little 6 Click Score: 12    End of Session   Activity Tolerance: Patient tolerated treatment well Patient left: in chair;with call bell/phone within reach Nurse Communication: Mobility status PT Visit Diagnosis: Other abnormalities of gait and mobility (R26.89);Pain Pain - part of body: (back at incision)     Time: 1610-9604 PT Time Calculation (min) (ACUTE ONLY): 28 min  Charges:  $Gait Training: 8-22 mins $Therapeutic Activity: 8-22 mins                    G Codes:       10/24/17  Donnella Sham, PT [phone]  (pager)   Tessie Fass Baleigh Rennaker 10-24-2017, 1:37 PM

## 2017-10-22 NOTE — Progress Notes (Signed)
CSW spoke with Broadus John from [employer] and was informed that they can take pt at [facility] once medically stable for discharge.   Bryan [name], MSW, [facility] Clinical Social Worker [phone]

## 2017-10-22 NOTE — Clinical Social Work Note (Signed)
Clinical Social Work Assessment  Patient Details  Name: Bryan [name] MRN: [id number] Date of Birth: 06-May-1941  Date of referral:  10/22/17               Reason for consult:  Facility Placement                Permission sought to share information with:  Family Supports Permission granted to share information::  Yes, Verbal Permission Granted  Name::     Toney Sang [phone]  Agency::  family   Relationship::  daughter  Contact Information:  Toney Sang [phone]  Housing/Transportation Living arrangements for the past 2 months:  Single Family Home(with wife. ) Source of Information:  Patient Patient Interpreter Needed:  None Criminal Activity/Legal Involvement Pertinent to Current Situation/Hospitalization:  No - Comment as needed Significant Relationships:  Adult Children, Spouse, Neighbor, Other Family Members, Friend Lives with:  Spouse Do you feel safe going back to the place where you live?  Yes Need for family participation in patient care:  Yes (Comment)  Care giving concerns:  CSW spoke with pt at bedside. At this time pt verbalized concerns regarding wanting to be placed in a facility that wife is expected to go to ([facility]). CSW was informed that pt only wants to go where wife will be. Pt is understanding that pt may or may not be able to get to the same facility as wife but is very hopeful.    Social Worker assessment / plan:  CSW spoke with pt at bedside. During this time CSW was informed that pt is from home with wife. Pt expressed that wife is a support for pt but at this time pt's wife is in the hospital herself and is unable to care for pt. Pt does have support from other relatives and family and friends. Pt reported that pt is interested in [facility] for rehab as this is the facility that pt's wife is expected to discharge to. CSW has sent information to other facilities in the [place] area.   During this assessment pt was lying in  bed drinking coffee and very pleasant. Pt expressed no further concerns to CSW at this time.   Employment status:  Retired Forensic scientist:  Medicare PT Recommendations:  [place] / Referral to community resources:  [facility](spoke with pt about placement options. )  Patient/Family's Response to care:  Pt's response to care was appropriate for diagnosis given at this time.   Patient/Family's Understanding of and Emotional Response to Diagnosis, Current Treatment, and Prognosis:  No further questions or concerns have been presnted to CSW at this time. Emotional response was understanding and agreeable. Pt was smiling and expressed that "you guys are some of the nicest people I have ever met".   Emotional Assessment Appearance:  Appears stated age Attitude/Demeanor/Rapport:  Engaged Affect (typically observed):  Appropriate, Pleasant Orientation:  Oriented to Place, Oriented to  Time, Oriented to Self, Oriented to Situation Alcohol / Substance use:  Not Applicable Psych involvement (Current and /or in the community):  No (Comment)  Discharge Needs  Concerns to be addressed:  Denies Needs/Concerns at this time Readmission within the last 30 days:  No Current discharge risk:  Dependent with Mobility Barriers to Discharge:  Continued Medical Work up   [employer], [place] 10/22/2017, 8:55 AM

## 2017-10-22 NOTE — Progress Notes (Signed)
Patient able to get out of bed to [facility] with a lot of pain even with medications po and IV. Concerns for discharge planning and continuing ambulation.  Attempted to give po pain meds more a scheduled time however patient still complained of excoriating pain.  Hemovac output 100cc for 12hrs.

## 2017-10-22 NOTE — Progress Notes (Signed)
Subjective: Patient resting in bed, comfortable.  PT and OT initiated.  Ambulated from bed to commode as well as a short walk in the halls yesterday.  Nursing staff notes significant drainage still into wound drain.  Objective: Vital signs in last 24 hours: Vitals:   10/21/17 2300 10/22/17 0332 10/22/17 0431 10/22/17 0700  BP: (!) 144/64 127/64    Pulse: (!) 104 92  79  Resp:      Temp: 97.6 F (36.4 C)  97.8 F (36.6 C)   TempSrc: Oral  Oral   SpO2: 97% 96%  94%  Weight:      Height:        Intake/Output from previous day: 05/31 0701 - 06/01 0700 In: 712.6 [P.O.:297; I.V.:150.6; IV Piggyback:200] Out: 225 [Urine:225] Intake/Output this shift: Total I/O In: -  Out: 280 [Drains:280]  Physical Exam: Moving lower extremities well.  CBC Recent Labs    10/20/17 1519  WBC 3.8*  HGB 9.0*  HCT 28.2*  PLT 263   BMET Recent Labs    10/20/17 1519  NA 136  K 4.0  CL 99*  CO2 25  GLUCOSE 92  BUN 22*  CREATININE 1.12  CALCIUM 9.5    Studies/Results: Dg Thoracic Spine 2 View  Result Date: 10/20/2017 CLINICAL DATA:  Portable operative thoracic spine imaging obtained for T9-T10 through T11-T12 fusion. EXAM: THORACIC SPINE 2 VIEWS; DG C-ARM 61-120 MIN COMPARISON:  None. FINDINGS: Two submitted images show pedicle screws in 3 contiguous vertebra where there is vertebroplasty cement. IMPRESSION: Imaging submitted for fusion of the lower thoracic spine. Electronically Signed   By: Lajean Manes M.D.   On: 10/20/2017 20:03   Dg C-arm 1-60 Min  Result Date: 10/20/2017 CLINICAL DATA:  Portable operative thoracic spine imaging obtained for T9-T10 through T11-T12 fusion. EXAM: THORACIC SPINE 2 VIEWS; DG C-ARM 61-120 MIN COMPARISON:  None. FINDINGS: Two submitted images show pedicle screws in 3 contiguous vertebra where there is vertebroplasty cement. IMPRESSION: Imaging submitted for fusion of the lower thoracic spine. Electronically Signed   By: Lajean Manes M.D.   On: 10/20/2017  20:03   Dg C-arm 1-60 Min  Result Date: 10/20/2017 CLINICAL DATA:  Portable operative thoracic spine imaging obtained for T9-T10 through T11-T12 fusion. EXAM: THORACIC SPINE 2 VIEWS; DG C-ARM 61-120 MIN COMPARISON:  None. FINDINGS: Two submitted images show pedicle screws in 3 contiguous vertebra where there is vertebroplasty cement. IMPRESSION: Imaging submitted for fusion of the lower thoracic spine. Electronically Signed   By: Lajean Manes M.D.   On: 10/20/2017 20:03    Assessment/Plan: Continue PT and OT.  Eventual transfer to rehab facility.  Nursing staff to monitor drainage into wound drain, will DC once significantly diminished.   Hosie Spangle, MD 10/22/2017, 9:32 AM

## 2017-10-22 NOTE — Progress Notes (Signed)
Occupational Therapy Evaluation Patient Details Name: Bryan [name] MRN: [id number] DOB: Oct 17, 1940 Today's Date: 10/22/2017    History of Present Illness pt is a 77 y/o male with h/o RA, HTN, neuropathy, CAD and multiple lumbar surgeries, admitted from SNF rehab with progressively worsening back pain shown to be due to a pulled out L1 screws and rods that were displaced.  Pt s/p revision of recent fusion, pedicle screw fixation T10-L2 and PLA T10-L2.   Clinical Impression   Pt undergoing rehab at snf prior to readmission. Prior to initial surgery, pt was independent with ADL and mobility. Pt currently requiring at lease min A with limited mobility and Mod A with LB ADL @ RW level. Recommend pt participate with OT at SNF to maximize functional level of independence. Will follow acutely to address establsiehd goals.     Follow Up Recommendations  Supervision/Assistance - 24 hour;SNF    Equipment Recommendations  None recommended by OT    Recommendations for Other Services       Precautions / Restrictions Precautions Precautions: Back Precaution Booklet Issued: Yes (comment) Precaution Comments: per MD, no need for bracing but pt wearing back brace Required Braces or Orthoses: Spinal Brace Spinal Brace: Lumbar corset;Applied in sitting position Restrictions Weight Bearing Restrictions: No      Mobility Bed Mobility               General bed mobility comments: OOB in chair  Transfers Overall transfer level: Needs assistance Equipment used: Rolling walker (2 wheeled) Transfers: Sit to/from Stand Sit to Stand: Min assist         General transfer comment: cues for safe hand placement  Increased time to transition hips to edge of chair and increased pain    Balance Overall balance assessment: Needs assistance Sitting-balance support: No upper extremity supported Sitting balance-Leahy Scale: Fair     Standing balance support: No upper extremity  supported;Single extremity supported Standing balance-Leahy Scale: Poor Standing balance comment: relied on the RW due to increased pain                           ADL either performed or assessed with clinical judgement   ADL Overall ADL's : Needs assistance/impaired Eating/Feeding: Independent   Grooming: Wash/dry hands;Wash/dry face;Oral care;Supervision/safety;Standing Grooming Details (indicate cue type and reason): able to state correct technique to avoid bending while grooming  Upper Body Bathing: Supervision/ safety;Set up;Sitting   Lower Body Bathing: Sit to/from stand;Moderate assistance   Upper Body Dressing : Set up;Sitting;Supervision/safety   Lower Body Dressing: Sit to/from stand;Moderate assistance   Toilet Transfer: Ambulation;Comfort height toilet;Grab bars;RW;Minimal assistance   Toileting- Clothing Manipulation and Hygiene: Sit to/from stand;Moderate assistance         General ADL Comments: unable to cross feet over knees at this time; May benefi tfomr AE for LB ADL; painful with minimal movment; Educated pt on availability of AE for LB ADL and pt staes he has been suign this wtih OT [facility]     Vision Baseline Vision/History: Wears glasses       Perception     Praxis      Pertinent Vitals/Pain Pain Assessment: 0-10 Faces Pain Scale: Hurts whole lot Pain Location: back Pain Descriptors / Indicators: Sore;Guarding;Moaning Pain Intervention(s): Limited activity within patient's tolerance     Hand Dominance Right   Extremity/Trunk Assessment Upper Extremity Assessment Upper Extremity Assessment: Generalized weakness(deformities from arthritis but functional)   Lower Extremity Assessment Lower  Extremity Assessment: Defer to PT evaluation   Cervical / Trunk Assessment Cervical / Trunk Assessment: Kyphotic   Communication Communication Communication: No difficulties   Cognition Arousal/Alertness: Awake/alert Behavior During Therapy:  WFL for tasks assessed/performed Overall Cognitive Status: Within Functional Limits for tasks assessed                                     General Comments       Exercises     Shoulder Instructions      Home Living Family/patient expects to be discharged to:: Skilled nursing facility Living Arrangements: Spouse/significant other Available Help at Discharge: Family;Available PRN/intermittently(wife being assisted by dtr temporarily,  wife can't help pt.) Type of Home: Other(Comment)(condo) Home Access: Level entry     Home Layout: One level     Bathroom Shower/Tub: Occupational psychologist: Handicapped height Bathroom Accessibility: Yes   Home Equipment: Grab bars - tub/shower;Shower seat;Bedside commode;Walker - 2 wheels;Cane - single point;Adaptive equipment Adaptive Equipment: Reacher;Sock aid;Long-handled shoe horn;Long-handled sponge        Prior Functioning/Environment Level of Independence: Independent with assistive device(s)(prior to the last surgery)        Comments: Pt is retired Software engineer.  pt ambulates with use of SPC,sometimes RW, and independent with ADLs        OT Problem List: Pain;Decreased knowledge of precautions;Decreased strength;Decreased range of motion;Decreased activity tolerance;Impaired balance (sitting and/or standing);Decreased safety awareness;Decreased knowledge of use of DME or AE      OT Treatment/Interventions: Self-care/ADL training;DME and/or AE instruction;Therapeutic activities;Patient/family education    OT Goals(Current goals can be found in the care plan section) Acute Rehab OT Goals Patient Stated Goal: to get more rehab OT Goal Formulation: With patient Time For Goal Achievement: 11/05/17 Potential to Achieve Goals: Good  OT Frequency: Min 2X/week   Barriers to D/C:            Co-evaluation              AM-PAC PT "6 Clicks" Daily Activity     Outcome Measure Help from another person  eating meals?: None Help from another person taking care of personal grooming?: A Little Help from another person toileting, which includes using toliet, bedpan, or urinal?: A Little Help from another person bathing (including washing, rinsing, drying)?: A Lot Help from another person to put on and taking off regular upper body clothing?: A Little Help from another person to put on and taking off regular lower body clothing?: A Lot 6 Click Score: 17   End of Session Equipment Utilized During Treatment: Rolling walker Nurse Communication: Mobility status  Activity Tolerance: Patient tolerated treatment well Patient left: with call bell/phone within reach;with family/visitor present;in chair  OT Visit Diagnosis: Pain;Muscle weakness (generalized) (M62.81) Pain - part of body: (back )                Time: 4854-6270 OT Time Calculation (min): 17 min Charges:  OT General Charges $OT Visit: 1 Visit OT Evaluation $OT Eval Moderate Complexity: 1 Mod G-Codes:     Maurie Boettcher, OT/L  OT Clinical Specialist [phone]   [facility] 10/22/2017, 1:29 PM

## 2017-10-23 MED ORDER — POLYETHYLENE GLYCOL 3350 17 G PO PACK
17.0000 g | PACK | Freq: Every day | ORAL | Status: DC
  Administered 2017-10-23 – 2017-10-24 (×2): 17 g via ORAL
  Filled 2017-10-23 (×2): qty 1

## 2017-10-23 MED ORDER — DOCUSATE SODIUM 100 MG PO CAPS
100.0000 mg | ORAL_CAPSULE | Freq: Two times a day (BID) | ORAL | Status: DC
  Administered 2017-10-23 – 2017-10-24 (×3): 100 mg via ORAL
  Filled 2017-10-23 (×3): qty 1

## 2017-10-23 MED ORDER — SENNA 8.6 MG PO TABS
1.0000 | ORAL_TABLET | Freq: Every day | ORAL | Status: DC
  Administered 2017-10-23 – 2017-10-24 (×2): 8.6 mg via ORAL
  Filled 2017-10-23 (×2): qty 1

## 2017-10-23 MED ORDER — TRAZODONE HCL 100 MG PO TABS
100.0000 mg | ORAL_TABLET | Freq: Every day | ORAL | Status: DC
  Administered 2017-10-23: 100 mg via ORAL
  Filled 2017-10-23: qty 1

## 2017-10-23 MED ORDER — BISACODYL 10 MG RE SUPP: 10.0000 mg | Freq: Every day | RECTAL | Status: DC | PRN

## 2017-10-23 NOTE — Progress Notes (Signed)
Pharmacy Antibiotic Note  Bryan [name] is a 77 y.o. male admitted on 10/19/2017 for L/T displaced hardware / revison.  Drain is in place and still having output. Renal function has remained stable. Afebrile, last WBC was wnl. Pt has a penicillin allergy.  Day 4 of vancomycin, will obtain vancomycin trough to ensure therapeutic level.  Plan: Vancomycin 1000 mg Q12hrs VT at 1330 Monitor renal fx, length of therapy  Height: 5\' 11"  (180.3 cm) Weight: 186 lb 8.2 oz (84.6 kg) IBW/kg (Calculated) : 75.3  Temp (24hrs), Avg:98.2 F (36.8 C), Min:97.8 F (36.6 C), Max:98.5 F (36.9 C)  Recent Labs  Lab 10/20/17 1519  WBC 3.8*  CREATININE 1.12    Estimated Creatinine Clearance: 57 mL/min (by C-G formula based on SCr of 1.12 mg/dL).    Allergies  Allergen Reactions  . Penicillins Hives, Swelling and Other (See Comments)    SWELLING REACTION UNSPECIFIED PATIENT HAS TAKEN AMOXICILLIN ON MED HX FROM [facility] PATIENT HAS HAD A PCN REACTION WITH IMMEDIATE RASH, FACIAL/TONGUE/THROAT SWELLING, SOB, OR LIGHTHEADEDNESS WITH HYPOTENSION:  #  #  #  YES  #  #  #   Has patient had a PCN reaction causing severe rash involving mucus membranes or skin necrosis: No Has patient had a PCN reaction that required hospitalization No Has patient had a PCN reaction occurring within the last 10 years: No  . Demerol [Meperidine] Hives and Nausea And Vomiting    Vancomycin 5/30>>  MRSA PCR negative  Patterson Hammersmith PharmD PGY1 Pharmacy Practice Resident 10/23/2017 9:28 AM Phone: [phone]

## 2017-10-23 NOTE — Progress Notes (Signed)
2200: Pt states he is having trouble sleeping and takes Trazodone at home. Contacted on-call MD to get order for at home Trazodone dose of 100 mg.   0630: While giving the pt a bath and changing linen, pt's bed pad showed about a baseball circle of serosanguinous fluid and bottom of honeycomb is slightly peeling up. New drainage on dressing has been marked. Pt complains of itching on his back and has been requesting RNs and techs to scratch his back.

## 2017-10-23 NOTE — Progress Notes (Signed)
Vitals:   10/22/17 1900 10/22/17 2300 10/23/17 0300 10/23/17 0741  BP: 120/61 134/64 128/69 (!) 145/66  Pulse: 85 80 84 84  Resp: 20   18  Temp: 98.2 F (36.8 C) 98.4 F (36.9 C) 98.2 F (36.8 C) 98.5 F (36.9 C)  TempSrc: Oral Oral Oral Oral  SpO2: 99% 97% 94% 97%  Weight:      Height:        CBC Recent Labs    10/20/17 1519  WBC 3.8*  HGB 9.0*  HCT 28.2*  PLT 263   BMET Recent Labs    10/20/17 1519  NA 136  K 4.0  CL 99*  CO2 25  GLUCOSE 92  BUN 22*  CREATININE 1.12  CALCIUM 9.5    Patient resting comfortably in bed.  Has been continuing PT and OT.  Only 20 cc of drainage into wound drain over the past 24 hours, have asked nursing staff to remove.  Plan: Continuing therapies.  Awaiting ultimate transfer to SNF for rehabilitation.  Patient has requested facility in [facility], where his wife is also going to be undergoing rehabilitation.  Hosie Spangle, MD 10/23/2017, 10:16 AM

## 2017-10-24 DIAGNOSIS — M069 Rheumatoid arthritis, unspecified: Secondary | ICD-10-CM | POA: Diagnosis not present

## 2017-10-24 DIAGNOSIS — D509 Iron deficiency anemia, unspecified: Secondary | ICD-10-CM | POA: Diagnosis present

## 2017-10-24 DIAGNOSIS — S32012A Unstable burst fracture of first lumbar vertebra, initial encounter for closed fracture: Secondary | ICD-10-CM | POA: Diagnosis not present

## 2017-10-24 DIAGNOSIS — N400 Enlarged prostate without lower urinary tract symptoms: Secondary | ICD-10-CM | POA: Diagnosis not present

## 2017-10-24 DIAGNOSIS — D520 Dietary folate deficiency anemia: Secondary | ICD-10-CM | POA: Diagnosis not present

## 2017-10-24 DIAGNOSIS — R279 Unspecified lack of coordination: Secondary | ICD-10-CM | POA: Diagnosis not present

## 2017-10-24 DIAGNOSIS — Z96652 Presence of left artificial knee joint: Secondary | ICD-10-CM | POA: Diagnosis present

## 2017-10-24 DIAGNOSIS — J3089 Other allergic rhinitis: Secondary | ICD-10-CM | POA: Diagnosis not present

## 2017-10-24 DIAGNOSIS — S32012D Unstable burst fracture of first lumbar vertebra, subsequent encounter for fracture with routine healing: Secondary | ICD-10-CM | POA: Diagnosis not present

## 2017-10-24 DIAGNOSIS — Z743 Need for continuous supervision: Secondary | ICD-10-CM | POA: Diagnosis not present

## 2017-10-24 DIAGNOSIS — E119 Type 2 diabetes mellitus without complications: Secondary | ICD-10-CM | POA: Diagnosis not present

## 2017-10-24 DIAGNOSIS — F3289 Other specified depressive episodes: Secondary | ICD-10-CM | POA: Diagnosis not present

## 2017-10-24 DIAGNOSIS — Z87891 Personal history of nicotine dependence: Secondary | ICD-10-CM | POA: Diagnosis not present

## 2017-10-24 DIAGNOSIS — F419 Anxiety disorder, unspecified: Secondary | ICD-10-CM | POA: Diagnosis present

## 2017-10-24 DIAGNOSIS — Z9049 Acquired absence of other specified parts of digestive tract: Secondary | ICD-10-CM | POA: Diagnosis not present

## 2017-10-24 DIAGNOSIS — K219 Gastro-esophageal reflux disease without esophagitis: Secondary | ICD-10-CM | POA: Diagnosis not present

## 2017-10-24 DIAGNOSIS — R011 Cardiac murmur, unspecified: Secondary | ICD-10-CM | POA: Diagnosis present

## 2017-10-24 DIAGNOSIS — I480 Paroxysmal atrial fibrillation: Secondary | ICD-10-CM | POA: Diagnosis present

## 2017-10-24 DIAGNOSIS — F329 Major depressive disorder, single episode, unspecified: Secondary | ICD-10-CM | POA: Diagnosis not present

## 2017-10-24 DIAGNOSIS — M549 Dorsalgia, unspecified: Secondary | ICD-10-CM | POA: Diagnosis not present

## 2017-10-24 DIAGNOSIS — M19072 Primary osteoarthritis, left ankle and foot: Secondary | ICD-10-CM | POA: Diagnosis present

## 2017-10-24 DIAGNOSIS — N179 Acute kidney failure, unspecified: Secondary | ICD-10-CM | POA: Diagnosis not present

## 2017-10-24 DIAGNOSIS — Z981 Arthrodesis status: Secondary | ICD-10-CM | POA: Diagnosis not present

## 2017-10-24 DIAGNOSIS — M6281 Muscle weakness (generalized): Secondary | ICD-10-CM | POA: Diagnosis not present

## 2017-10-24 DIAGNOSIS — R531 Weakness: Secondary | ICD-10-CM | POA: Diagnosis not present

## 2017-10-24 DIAGNOSIS — Z9842 Cataract extraction status, left eye: Secondary | ICD-10-CM | POA: Diagnosis not present

## 2017-10-24 DIAGNOSIS — Z952 Presence of prosthetic heart valve: Secondary | ICD-10-CM | POA: Diagnosis not present

## 2017-10-24 DIAGNOSIS — H409 Unspecified glaucoma: Secondary | ICD-10-CM | POA: Diagnosis present

## 2017-10-24 DIAGNOSIS — I2510 Atherosclerotic heart disease of native coronary artery without angina pectoris: Secondary | ICD-10-CM | POA: Diagnosis not present

## 2017-10-24 DIAGNOSIS — I6529 Occlusion and stenosis of unspecified carotid artery: Secondary | ICD-10-CM | POA: Diagnosis not present

## 2017-10-24 DIAGNOSIS — G8929 Other chronic pain: Secondary | ICD-10-CM | POA: Diagnosis present

## 2017-10-24 DIAGNOSIS — I4891 Unspecified atrial fibrillation: Secondary | ICD-10-CM | POA: Diagnosis not present

## 2017-10-24 DIAGNOSIS — D638 Anemia in other chronic diseases classified elsewhere: Secondary | ICD-10-CM | POA: Diagnosis present

## 2017-10-24 DIAGNOSIS — G4700 Insomnia, unspecified: Secondary | ICD-10-CM | POA: Diagnosis not present

## 2017-10-24 DIAGNOSIS — M19071 Primary osteoarthritis, right ankle and foot: Secondary | ICD-10-CM | POA: Diagnosis present

## 2017-10-24 DIAGNOSIS — E785 Hyperlipidemia, unspecified: Secondary | ICD-10-CM | POA: Diagnosis not present

## 2017-10-24 DIAGNOSIS — K5900 Constipation, unspecified: Secondary | ICD-10-CM | POA: Diagnosis not present

## 2017-10-24 DIAGNOSIS — Z888 Allergy status to other drugs, medicaments and biological substances status: Secondary | ICD-10-CM | POA: Diagnosis not present

## 2017-10-24 DIAGNOSIS — Z5189 Encounter for other specified aftercare: Secondary | ICD-10-CM | POA: Diagnosis not present

## 2017-10-24 DIAGNOSIS — Z961 Presence of intraocular lens: Secondary | ICD-10-CM | POA: Diagnosis present

## 2017-10-24 DIAGNOSIS — I10 Essential (primary) hypertension: Secondary | ICD-10-CM | POA: Diagnosis not present

## 2017-10-24 DIAGNOSIS — D649 Anemia, unspecified: Secondary | ICD-10-CM | POA: Diagnosis not present

## 2017-10-24 DIAGNOSIS — R52 Pain, unspecified: Secondary | ICD-10-CM | POA: Diagnosis not present

## 2017-10-24 DIAGNOSIS — R5383 Other fatigue: Secondary | ICD-10-CM | POA: Diagnosis not present

## 2017-10-24 DIAGNOSIS — Z880 Allergy status to penicillin: Secondary | ICD-10-CM | POA: Diagnosis not present

## 2017-10-24 DIAGNOSIS — F0390 Unspecified dementia without behavioral disturbance: Secondary | ICD-10-CM | POA: Diagnosis not present

## 2017-10-24 DIAGNOSIS — Z9889 Other specified postprocedural states: Secondary | ICD-10-CM | POA: Diagnosis not present

## 2017-10-24 DIAGNOSIS — Z9841 Cataract extraction status, right eye: Secondary | ICD-10-CM | POA: Diagnosis not present

## 2017-10-24 MED ORDER — CYCLOBENZAPRINE HCL 5 MG PO TABS: 5.0000 mg | ORAL_TABLET | Freq: Three times a day (TID) | ORAL | 0 refills | Status: DC | PRN

## 2017-10-24 MED ORDER — HYDROCODONE-ACETAMINOPHEN 5-325 MG PO TABS: 2.0000 | ORAL_TABLET | ORAL | 0 refills | Status: DC | PRN

## 2017-10-24 NOTE — Discharge Summary (Signed)
Physician Discharge Summary  Patient ID: Bryan [name] MRN: [id number] DOB/AGE: 07-07-1940 77 y.o.  Admit date: 10/19/2017 Discharge date: 10/24/2017  Admission Diagnoses: L1 burst fracture displaced hardware and instability T12-L1 and L1-L2  Discharge Diagnoses: same   Discharged Condition: good  Hospital Course: The patient was admitted on 10/19/2017 and taken to the operating room where the patient underwent t10-L2 posterior lateral arthrodesis. The patient tolerated the procedure well and was taken to the recovery room and then to the floor in stable condition. The hospital course was routine. There were no complications. The wound remained clean dry and intact. Pt had appropriate back soreness. No complaints of leg pain or new N/T/W. The patient remained afebrile with stable vital signs, and tolerated a regular diet. The patient continued to increase activities, and pain was well controlled with oral pain medications.   Consults: None  Significant Diagnostic Studies:  Results for orders placed or performed during the hospital encounter of 10/19/17  MRSA PCR Screening  Result Value Ref Range   MRSA by PCR NEGATIVE NEGATIVE  CBC  Result Value Ref Range   WBC 3.8 (L) 4.0 - 10.5 K/uL   RBC 3.27 (L) 4.22 - 5.81 MIL/uL   Hemoglobin 9.0 (L) 13.0 - 17.0 g/dL   HCT 28.2 (L) 39.0 - 52.0 %   MCV 86.2 78.0 - 100.0 fL   MCH 27.5 26.0 - 34.0 pg   MCHC 31.9 30.0 - 36.0 g/dL   RDW 14.6 11.5 - 15.5 %   Platelets 263 150 - 400 K/uL  Basic metabolic panel  Result Value Ref Range   Sodium 136 135 - 145 mmol/L   Potassium 4.0 3.5 - 5.1 mmol/L   Chloride 99 (L) 101 - 111 mmol/L   CO2 25 22 - 32 mmol/L   Glucose, Bld 92 65 - 99 mg/dL   BUN 22 (H) 6 - 20 mg/dL   Creatinine, Ser 1.12 0.61 - 1.24 mg/dL   Calcium 9.5 8.9 - 10.3 mg/dL   GFR calc non Af Amer >60 >60 mL/min   GFR calc Af Amer >60 >60 mL/min   Anion gap 12 5 - 15  Type and screen [place]  Result Value Ref  Range   ABO/RH(D) O POS    Antibody Screen NEG    Sample Expiration      10/23/2017 Performed at [facility], [address], [place], [place] [address]     Dg Thoracic Spine 2 View  Result Date: 10/20/2017 CLINICAL DATA:  Portable operative thoracic spine imaging obtained for T9-T10 through T11-T12 fusion. EXAM: THORACIC SPINE 2 VIEWS; DG C-ARM 61-120 MIN COMPARISON:  None. FINDINGS: Two submitted images show pedicle screws in 3 contiguous vertebra where there is vertebroplasty cement. IMPRESSION: Imaging submitted for fusion of the lower thoracic spine. Electronically Signed   By: Lajean Manes M.D.   On: 10/20/2017 20:03   Dg Lumbar Spine 2-3 Views  Result Date: 09/28/2017 CLINICAL DATA:  L1-2 extension fusion, removal of L3 screws. EXAM: Lateral scout radiograph from a lumbar spine CT scan dated August 24, 2017 COMPARISON:  Lateral scout radiograph from a CT scan of the lumbar spine of August 24, 2017 FINDINGS: Two fluoro spot images are reviewed. The fluoro time reported is 38 seconds. There pedicle screws which have been placed at L1 and L2. No connecting rods are as yet present. L3 pedicle screws have been removed. IMPRESSION: Placement of pedicle screws at L1 and L2. Removal of pedicle screws  at L3. Electronically Signed   By: David  Martinique M.D.   On: 09/28/2017 15:32   Ct Thoracic Spine Wo Contrast  Result Date: 10/19/2017 CLINICAL DATA:  77 y/o M; preop scan. T10-L2 revision operation with removal of L1 screws. EXAM: CT THORACIC SPINE WITHOUT CONTRAST TECHNIQUE: Multidetector CT images of the thoracic were obtained using the standard protocol without intravenous contrast. COMPARISON:  07/30/2016 CT of chest. 10/11/2017 lumbar spine radiographs. FINDINGS: Alignment: Normal. Vertebrae: L1 vertebral body superior endplate fracture and compression deformity involving anterior and middle columns with 40% loss of vertebral body height and minimal 2 mm retropulsion of superior endplate.  Posterior instrumentation fusion hardware is partially visualized with pedicle screws in the L1 and L2 vertebral bodies. There is morselized bone graft material along the posterior elements at levels of fusion. Bones are demineralized. Paraspinal and other soft tissues: Coronary artery calcification. Aortic valve replacement. Small hiatal hernia. There are few small branching metallic densities within the right lung apex along the bronchovascular bundles, probably cement/foreign body emboli (series 5, image 13). Disc levels: Mild multilevel discogenic degenerative changes with small marginal osteophytes. No high-grade bony foraminal or canal stenosis. IMPRESSION: 1. Stable 40% L1 compression deformity involving anterior and middle columns. No new acute fracture identified. 2. Partially visualized lumbar fusion hardware from L1 extending below field of view. Electronically Signed   By: Kristine Garbe M.D.   On: 10/19/2017 22:18   Dg C-arm 1-60 Min  Result Date: 10/20/2017 CLINICAL DATA:  Portable operative thoracic spine imaging obtained for T9-T10 through T11-T12 fusion. EXAM: THORACIC SPINE 2 VIEWS; DG C-ARM 61-120 MIN COMPARISON:  None. FINDINGS: Two submitted images show pedicle screws in 3 contiguous vertebra where there is vertebroplasty cement. IMPRESSION: Imaging submitted for fusion of the lower thoracic spine. Electronically Signed   By: Lajean Manes M.D.   On: 10/20/2017 20:03   Dg C-arm 1-60 Min  Result Date: 10/20/2017 CLINICAL DATA:  Portable operative thoracic spine imaging obtained for T9-T10 through T11-T12 fusion. EXAM: THORACIC SPINE 2 VIEWS; DG C-ARM 61-120 MIN COMPARISON:  None. FINDINGS: Two submitted images show pedicle screws in 3 contiguous vertebra where there is vertebroplasty cement. IMPRESSION: Imaging submitted for fusion of the lower thoracic spine. Electronically Signed   By: Lajean Manes M.D.   On: 10/20/2017 20:03   Dg C-arm 1-60 Min  Result Date:  09/28/2017 CLINICAL DATA:  L1-2 extension fusion, removal of L3 screws. EXAM: Lateral scout radiograph from a lumbar spine CT scan dated August 24, 2017 COMPARISON:  Lateral scout radiograph from a CT scan of the lumbar spine of August 24, 2017 FINDINGS: Two fluoro spot images are reviewed. The fluoro time reported is 38 seconds. There pedicle screws which have been placed at L1 and L2. No connecting rods are as yet present. L3 pedicle screws have been removed. IMPRESSION: Placement of pedicle screws at L1 and L2. Removal of pedicle screws at L3. Electronically Signed   By: David  Martinique M.D.   On: 09/28/2017 15:32    Antibiotics:  Anti-infectives (From admission, onward)   Start     Dose/Rate Route Frequency Ordered Stop   10/21/17 0200  vancomycin (VANCOCIN) IVPB 1000 mg/200 mL premix     1,000 mg 200 mL/hr over 60 Minutes Intravenous Every 12 hours 10/20/17 2129     10/20/17 1742  vancomycin (VANCOCIN) powder  Status:  Discontinued       As needed 10/20/17 1743 10/20/17 1930   10/20/17 1710  bacitracin 50,000 Units in sodium  chloride 0.9 % 500 mL irrigation  Status:  Discontinued       As needed 10/20/17 1710 10/20/17 1930   10/20/17 0845  vancomycin (VANCOCIN) IVPB 1000 mg/200 mL premix     1,000 mg 200 mL/hr over 60 Minutes Intravenous To Surgery 10/20/17 0813 10/20/17 1603      Discharge Exam: Blood pressure (!) 146/66, pulse 79, temperature 98.2 F (36.8 C), resp. rate 16, height 5\' 11"  (1.803 m), weight 84.6 kg (186 lb 8.2 oz), SpO2 100 %. Neurologic: Grossly normal Ambulating and voiding well, incision CDI  Discharge Medications:   Allergies as of 10/24/2017      Reactions   Penicillins Hives, Swelling, Other (See Comments)   SWELLING REACTION UNSPECIFIED PATIENT HAS TAKEN AMOXICILLIN ON MED HX FROM [facility] PATIENT HAS HAD A PCN REACTION WITH IMMEDIATE RASH, FACIAL/TONGUE/THROAT SWELLING, SOB, OR LIGHTHEADEDNESS WITH HYPOTENSION:  #  #  #  YES  #  #  #   Has patient had a PCN reaction  causing severe rash involving mucus membranes or skin necrosis: No Has patient had a PCN reaction that required hospitalization No Has patient had a PCN reaction occurring within the last 10 years: No   Demerol [meperidine] Hives, Nausea And Vomiting      Medication List    TAKE these medications   amLODipine 5 MG tablet Commonly known as:  NORVASC TAKE ONE TABLET BY MOUTH EVERY DAY   amoxicillin 500 MG capsule Commonly known as:  AMOXIL Take 2,000 mg by mouth See admin instructions. Take 4 Capsules prior to dental appointment   aspirin 325 MG EC tablet Take 325 mg by mouth daily at 2 PM.   atorvastatin 40 MG tablet Commonly known as:  LIPITOR Take 1 tablet (40 mg total) by mouth daily. What changed:  when to take this   cetirizine 10 MG tablet Commonly known as:  ZYRTEC Take 10 mg by mouth daily.   cyclobenzaprine 10 MG tablet Commonly known as:  FLEXERIL Take 1 tablet (10 mg total) by mouth 3 (three) times daily as needed for muscle spasms. What changed:  Another medication with the same name was added. Make sure you understand how and when to take each.   cyclobenzaprine 5 MG tablet Commonly known as:  FLEXERIL Take 1 tablet (5 mg total) by mouth 3 (three) times daily as needed for muscle spasms. What changed:  You were already taking a medication with the same name, and this prescription was added. Make sure you understand how and when to take each.   donepezil 5 MG tablet Commonly known as:  ARICEPT Take 5 mg by mouth at bedtime.   fenofibrate 160 MG tablet TAKE ONE TABLET BY MOUTH EVERY DAY What changed:    how much to take  how to take this  when to take this   FLUoxetine 20 MG capsule Commonly known as:  PROZAC TAKE 1 CAPSULE BY MOUTH EVERY DAY   folic acid 1 MG tablet Commonly known as:  FOLVITE Take 1 mg by mouth 2 (two) times daily.   gabapentin 400 MG capsule Commonly known as:  NEURONTIN Take 400 mg by mouth 3 (three) times daily.    HYDROcodone-acetaminophen 10-325 MG tablet Commonly known as:  NORCO Take 1 tablet by mouth every 4 (four) hours as needed for moderate pain. What changed:  Another medication with the same name was added. Make sure you understand how and when to take each.   HYDROcodone-acetaminophen 10-325 MG tablet Commonly known  as:  NORCO Take 1 tablet by mouth every 8 (eight) hours as needed. What changed:  Another medication with the same name was added. Make sure you understand how and when to take each.   HYDROcodone-acetaminophen 5-325 MG tablet Commonly known as:  NORCO/VICODIN Take 2 tablets by mouth every 4 (four) hours as needed for moderate pain. What changed:  You were already taking a medication with the same name, and this prescription was added. Make sure you understand how and when to take each.   hydrocortisone cream 1 % Apply 1 application topically daily as needed for itching.   lisinopril 20 MG tablet Commonly known as:  PRINIVIL,ZESTRIL TAKE ONE TABLET BY MOUTH EVERY EVENING   MYRBETRIQ 50 MG Tb24 tablet Generic drug:  mirabegron ER Take 50 mg by mouth daily.   omeprazole 40 MG capsule Commonly known as:  PRILOSEC TAKE 1 CAPSULE BY MOUTH DAILY What changed:    how much to take  how to take this  when to take this   polyethylene glycol powder powder Commonly known as:  GLYCOLAX/MIRALAX USE 1 CAPFUL IN 4 TO 8 OUNCES OF LIQUID DAILY   tamsulosin 0.4 MG Caps capsule Commonly known as:  FLOMAX TAKE 1 CAPSULE BY MOUTH DAILY What changed:    how much to take  how to take this  when to take this   testosterone cypionate 200 MG/ML injection Commonly known as:  DEPOTESTOSTERONE CYPIONATE INJECT 4mL INTO THE MUSCLE EVERY 14 DAYS   traZODone 50 MG tablet Commonly known as:  DESYREL Take 100 mg by mouth at bedtime.       Disposition: snf   Final Dx: posterior lateral arthrodesis  Discharge Instructions    Call MD for:  difficulty breathing, headache or  visual disturbances   Complete by:  As directed    Call MD for:  hives   Complete by:  As directed    Call MD for:  persistant dizziness or light-headedness   Complete by:  As directed    Call MD for:  persistant nausea and vomiting   Complete by:  As directed    Call MD for:  redness, tenderness, or signs of infection (pain, swelling, redness, odor or green/yellow discharge around incision site)   Complete by:  As directed    Call MD for:  severe uncontrolled pain   Complete by:  As directed    Call MD for:  temperature >100.4   Complete by:  As directed    Diet - low sodium heart healthy   Complete by:  As directed    Driving Restrictions   Complete by:  As directed    No driving 2 weeks   Increase activity slowly   Complete by:  As directed    Lifting restrictions   Complete by:  As directed    No lifting more than 8 lbs   Remove dressing in 24 hours   Complete by:  As directed       Contact information for after-discharge care    Destination    [facility] .   Service:  Skilled Nursing Contact information: [address] [place] [phone]               Signed: Ocie Cornfield [facility] 10/24/2017, 11:43 AM

## 2017-10-24 NOTE — Progress Notes (Signed)
Subjective: Patient reports mild back pain but doing very well. Ambulating well  Objective: Vital signs in last 24 hours: Temp:  [97.5 F (36.4 C)-98.9 F (37.2 C)] 98.5 F (36.9 C) (06/03 0300) Pulse Rate:  [79-99] 79 (06/03 0300) Resp:  [16-20] 16 (06/03 0300) BP: (131-164)/(61-82) 146/66 (06/03 0300) SpO2:  [96 %-100 %] 100 % (06/03 0300)  Intake/Output from previous day: 06/02 0701 - 06/03 0700 In: 883 [P.O.:480; I.V.:3; IV Piggyback:400] Out: 1870 [Urine:1850; Drains:20] Intake/Output this shift: No intake/output data recorded.  Neurologic: Grossly normal  Lab Results: Lab Results  Component Value Date   WBC 3.8 (L) 10/20/2017   HGB 9.0 (L) 10/20/2017   HCT 28.2 (L) 10/20/2017   MCV 86.2 10/20/2017   PLT 263 10/20/2017   Lab Results  Component Value Date   INR 1.01 01/26/2017   BMET Lab Results  Component Value Date   NA 136 10/20/2017   K 4.0 10/20/2017   CL 99 (L) 10/20/2017   CO2 25 10/20/2017   GLUCOSE 92 10/20/2017   BUN 22 (H) 10/20/2017   CREATININE 1.12 10/20/2017   CALCIUM 9.5 10/20/2017    Studies/Results: No results found.  Assessment/Plan: Continues to do well with some back pain. Awaiting SNF placement. Hopefully D/C today. Please text/call when bed available.   LOS: 5 days    Ocie Cornfield Bryan [name] 10/24/2017, 8:00 AM

## 2017-10-24 NOTE — Progress Notes (Signed)
Physical Therapy Treatment Patient Details Name: Bryan [name] MRN: [id number] DOB: 03-08-1941 Today's Date: 10/24/2017    History of Present Illness pt is a 77 y/o male with h/o RA, HTN, neuropathy, CAD and multiple lumbar surgeries, admitted from SNF rehab with progressively worsening back pain shown to be due to a pulled out L1 screws and rods that were displaced.  Pt s/p revision of recent fusion, pedicle screw fixation T10-L2 and PLA T10-L2.    PT Comments    Making small improvements.  Pain limiting smooth transitions and some part of donning each piece of clothing. 10/24/2017  Bryan [name], PT [phone]  (pager)   Follow Up Recommendations  SNF     Equipment Recommendations  None recommended by PT    Recommendations for Other Services       Precautions / Restrictions Precautions Precautions: Back Precaution Booklet Issued: Yes (comment)    Mobility  Bed Mobility Overal bed mobility: Needs Assistance Bed Mobility: Rolling;Sidelying to Sit Rolling: Mod assist Sidelying to sit: Mod assist       General bed mobility comments: pt guarding so heavily with the pain that he has to use the rail and extra assist  Transfers Overall transfer level: Needs assistance Equipment used: Rolling walker (2 wheeled) Transfers: Sit to/from Stand Sit to Stand: Min assist         General transfer comment: cues for safe hand placement  Ambulation/Gait Ambulation/Gait assistance: Min guard Ambulation Distance (Feet): 200 Feet Assistive device: Rolling walker (2 wheeled) Gait Pattern/deviations: Step-through pattern Gait velocity: slower   General Gait Details: flexed kneed gait with flexed posture and generally guarded with pain.   Stairs             Wheelchair Mobility    Modified Rankin (Stroke Patients Only)       Balance     Sitting balance-Leahy Scale: Fair       Standing balance-Leahy Scale: Poor Standing balance comment:  relied on the RW due to increased pain, but could take his hands off the RW to button his pants                            Cognition Arousal/Alertness: Awake/alert Behavior During Therapy: WFL for tasks assessed/performed Overall Cognitive Status: Within Functional Limits for tasks assessed                                        Exercises      General Comments General comments (skin integrity, edema, etc.): Worked on sitting balance and activity tolerance during donning all his clothing to transport to [facility]   pt needed min support and min to mod assist for some part of each task.      Pertinent Vitals/Pain Pain Assessment: Faces Faces Pain Scale: Hurts whole lot Pain Descriptors / Indicators: Sore;Guarding;Moaning Pain Intervention(s): Monitored during session;Premedicated before session    Home Living                      Prior Function            PT Goals (current goals can now be found in the care plan section) Acute Rehab PT Goals Patient Stated Goal: to get more rehab PT Goal Formulation: With patient Time For Goal Achievement: 10/28/17 Potential to Achieve Goals: Good Progress towards PT goals: Progressing  toward goals    Frequency    Min 5X/week      PT Plan Current plan remains appropriate    Co-evaluation              AM-PAC PT "6 Clicks" Daily Activity  Outcome Measure  Difficulty turning over in bed (including adjusting bedclothes, sheets and blankets)?: Unable Difficulty moving from lying on back to sitting on the side of the bed? : Unable Difficulty sitting down on and standing up from a chair with arms (e.g., wheelchair, bedside commode, etc,.)?: Unable Help needed moving to and from a bed to chair (including a wheelchair)?: A Little Help needed walking in hospital room?: A Little Help needed climbing 3-5 steps with a railing? : A Little 6 Click Score: 12    End of Session     Patient left: in  chair;with call bell/phone within reach Nurse Communication: Mobility status PT Visit Diagnosis: Other abnormalities of gait and mobility (R26.89);Pain     Time: 1330-1356 PT Time Calculation (min) (ACUTE ONLY): 26 min  Charges:  $Gait Training: 8-22 mins $Self Care/Home Management: 8-22                    G Codes:       2017-11-11  Bryan [name], PT [phone]  (pager)   Bryan [name] Bryan [name] 11-11-2017, 2:35 PM

## 2017-10-24 NOTE — Social Work (Signed)
Pt able to discharge to [facility] today.  Referral made to [facility] program, as pt is [facility] pt and is discharging to a partner SNF.   NP made aware of arrangements, await discharge summary to arrange transport, aware pt script is signed and ready. Alexander Mt, [place] Work [phone]

## 2017-10-24 NOTE — Clinical Social Work Placement (Signed)
   CLINICAL SOCIAL WORK PLACEMENT  NOTE [facility]   Date:  10/24/2017  Patient Details  Name: Bryan [name] MRN: [id number] Date of Birth: August 15, 1940  Clinical Social Work is seeking post-discharge placement for this patient at the [place] level of care (*CSW will initial, date and re-position this form in  chart as items are completed):      Patient/family provided with [place] Work Department's list of facilities offering this level of care within the geographic area requested by the patient (or if unable, by the patient's family).  Yes   Patient/family informed of their freedom to choose among providers that offer the needed level of care, that participate in Medicare, Medicaid or managed care program needed by the patient, have an available bed and are willing to accept the patient.      Patient/family informed of [facility]'s ownership interest in [facility] and [facility], as well as of the fact that they are under no obligation to receive care at these facilities.  PASRR submitted to EDS on       PASRR number received on       Existing PASRR number confirmed on 10/20/17     FL2 transmitted to all facilities in geographic area requested by pt/family on 10/20/17     FL2 transmitted to all facilities within larger geographic area on       Patient informed that his/her managed care company has contracts with or will negotiate with certain facilities, including the following:        Yes   Patient/family informed of bed offers received.  Patient chooses bed at [facility]     Physician recommends and patient chooses bed at      Patient to be transferred to [facility] on 10/24/17.  Patient to be transferred to facility by [facility]     Patient family notified on 10/24/17 of transfer.  Name of family member notified:  pt wife, [name]     PHYSICIAN Please prepare priority discharge  summary, including medications     Additional Comment:    _______________________________________________ Alexander Mt, LCSWA 10/24/2017, 11:03 AM

## 2017-10-24 NOTE — Anesthesia Postprocedure Evaluation (Signed)
Anesthesia Post Note  Patient: Bryan [name]  Procedure(s) Performed: Revision fusion and removal of hardware Lumbar one, Pedicle screw fixation thoracic ten-lumbar two, thoracic nine-ten laminotomy, Posterior Lumbar Arthrodesis thoracic ten-lumbar two  (N/A Back)     Patient location during evaluation: PACU Anesthesia Type: General Level of consciousness: awake and alert Pain management: pain level controlled Vital Signs Assessment: post-procedure vital signs reviewed and stable Respiratory status: spontaneous breathing, nonlabored ventilation, respiratory function stable and patient connected to nasal cannula oxygen Cardiovascular status: blood pressure returned to baseline and stable Postop Assessment: no apparent nausea or vomiting Anesthetic complications: no    Last Vitals:  Vitals:   10/24/17 0856 10/24/17 1220  BP:  (!) 153/72  Pulse:  80  Resp:    Temp: 36.8 C 37 C  SpO2:  96%    Last Pain:  Vitals:   10/24/17 1315  TempSrc:   PainSc: 8                  Lexie Morini

## 2017-10-24 NOTE — Social Work (Signed)
Clinical Social Worker facilitated patient discharge including contacting patient family and facility to confirm patient discharge plans.  Clinical information faxed to facility and family agreeable with plan.  CSW arranged ambulance transport via [facility] to [facility] rm [phone].  RN to call [phone] with report for the [place] prior to discharge.  Clinical Social Worker will sign off for now as social work intervention is no longer needed. Please consult [place] again if new need arises.  Alexander Mt, [place] Social Worker [phone]

## 2017-10-24 NOTE — Care Management Note (Signed)
Case Management Note  Patient Details  Name: Bryan [name] MRN: [id number] Date of Birth: January 01, 1941  Subjective/Objective:   pt is a 77 y/o male with h/o RA, HTN, neuropathy, CAD and multiple lumbar surgeries, admitted from SNF rehab with progressively worsening back pain shown to be due to a pulled out L1 screws and rods that were displaced.  Pt s/p revision of recent fusion, pedicle screw fixation T10-L2 and PLA T10-L2.                  Action/Plan: CSW consulted to facilitate return to SNF upon medical stability.  Will follow progress.  Expected Discharge Date:  10/24/17               Expected Discharge Plan:  Skilled Nursing Facility  In-House Referral:  Clinical Social Work  Discharge planning Services  CM Consult  Post Acute Care Choice:    Choice offered to:     DME Arranged:    DME Agency:     HH Arranged:    [place] Agency:     Status of Service:  Completed, signed off  If discussed at [employer], dates discussed:    Additional Comments:  10/24/17 J. Michell Kader, RN, BSN Pt medically stable for discharge today.  Plan dc to SNF, per CSW arrangements.  Ella Bodo, RN 10/24/2017, 4:03 PM

## 2017-10-24 NOTE — Progress Notes (Signed)
Patient D/C to [facility] , report given to staff, IV removed.  D/C instructions with printed prescription given to [facility] to take with patient to facility.  Patient with all belongings at time of D/C.

## 2017-10-24 NOTE — Care Management Important Message (Signed)
Important Message  Patient Details  Name: Bryan [name] MRN: [id number] Date of Birth: 27-Nov-1940   Medicare Important Message Given:  No Patient declined an additional copy of his rights   Orbie Pyo 10/24/2017, 3:17 PM

## 2017-10-26 DIAGNOSIS — F0390 Unspecified dementia without behavioral disturbance: Secondary | ICD-10-CM | POA: Diagnosis not present

## 2017-10-26 DIAGNOSIS — K5900 Constipation, unspecified: Secondary | ICD-10-CM | POA: Diagnosis not present

## 2017-10-26 DIAGNOSIS — E785 Hyperlipidemia, unspecified: Secondary | ICD-10-CM | POA: Diagnosis not present

## 2017-10-26 DIAGNOSIS — F329 Major depressive disorder, single episode, unspecified: Secondary | ICD-10-CM | POA: Diagnosis not present

## 2017-10-26 DIAGNOSIS — I10 Essential (primary) hypertension: Secondary | ICD-10-CM | POA: Diagnosis not present

## 2017-10-26 DIAGNOSIS — S32012A Unstable burst fracture of first lumbar vertebra, initial encounter for closed fracture: Secondary | ICD-10-CM | POA: Diagnosis not present

## 2017-10-28 ENCOUNTER — Inpatient Hospital Stay
Admission: EM | Admit: 2017-10-28 | Discharge: 2017-10-31 | DRG: 812 | Disposition: A | Discharge status: Skilled Nursing Facility | Payer: Medicare Other | Attending: Internal Medicine | Admitting: Internal Medicine

## 2017-10-28 ENCOUNTER — Encounter: Payer: Self-pay | Admitting: Emergency Medicine

## 2017-10-28 ENCOUNTER — Other Ambulatory Visit: Payer: Self-pay

## 2017-10-28 DIAGNOSIS — I10 Essential (primary) hypertension: Secondary | ICD-10-CM | POA: Diagnosis present

## 2017-10-28 DIAGNOSIS — Z952 Presence of prosthetic heart valve: Secondary | ICD-10-CM | POA: Diagnosis not present

## 2017-10-28 DIAGNOSIS — M19071 Primary osteoarthritis, right ankle and foot: Secondary | ICD-10-CM | POA: Diagnosis present

## 2017-10-28 DIAGNOSIS — S32012D Unstable burst fracture of first lumbar vertebra, subsequent encounter for fracture with routine healing: Secondary | ICD-10-CM | POA: Diagnosis not present

## 2017-10-28 DIAGNOSIS — I480 Paroxysmal atrial fibrillation: Secondary | ICD-10-CM | POA: Diagnosis present

## 2017-10-28 DIAGNOSIS — M545 Low back pain: Secondary | ICD-10-CM | POA: Diagnosis not present

## 2017-10-28 DIAGNOSIS — K5900 Constipation, unspecified: Secondary | ICD-10-CM | POA: Diagnosis not present

## 2017-10-28 DIAGNOSIS — H409 Unspecified glaucoma: Secondary | ICD-10-CM | POA: Diagnosis present

## 2017-10-28 DIAGNOSIS — Z87891 Personal history of nicotine dependence: Secondary | ICD-10-CM | POA: Diagnosis not present

## 2017-10-28 DIAGNOSIS — R011 Cardiac murmur, unspecified: Secondary | ICD-10-CM | POA: Diagnosis present

## 2017-10-28 DIAGNOSIS — I2510 Atherosclerotic heart disease of native coronary artery without angina pectoris: Secondary | ICD-10-CM | POA: Diagnosis not present

## 2017-10-28 DIAGNOSIS — Z79891 Long term (current) use of opiate analgesic: Secondary | ICD-10-CM

## 2017-10-28 DIAGNOSIS — F419 Anxiety disorder, unspecified: Secondary | ICD-10-CM | POA: Diagnosis present

## 2017-10-28 DIAGNOSIS — J3089 Other allergic rhinitis: Secondary | ICD-10-CM | POA: Diagnosis not present

## 2017-10-28 DIAGNOSIS — Z86010 Personal history of colonic polyps: Secondary | ICD-10-CM

## 2017-10-28 DIAGNOSIS — Z880 Allergy status to penicillin: Secondary | ICD-10-CM

## 2017-10-28 DIAGNOSIS — F329 Major depressive disorder, single episode, unspecified: Secondary | ICD-10-CM | POA: Diagnosis present

## 2017-10-28 DIAGNOSIS — Z9841 Cataract extraction status, right eye: Secondary | ICD-10-CM

## 2017-10-28 DIAGNOSIS — D509 Iron deficiency anemia, unspecified: Secondary | ICD-10-CM | POA: Diagnosis not present

## 2017-10-28 DIAGNOSIS — Z9889 Other specified postprocedural states: Secondary | ICD-10-CM | POA: Diagnosis not present

## 2017-10-28 DIAGNOSIS — I6529 Occlusion and stenosis of unspecified carotid artery: Secondary | ICD-10-CM | POA: Diagnosis not present

## 2017-10-28 DIAGNOSIS — K219 Gastro-esophageal reflux disease without esophagitis: Secondary | ICD-10-CM | POA: Diagnosis not present

## 2017-10-28 DIAGNOSIS — N179 Acute kidney failure, unspecified: Secondary | ICD-10-CM | POA: Diagnosis present

## 2017-10-28 DIAGNOSIS — D520 Dietary folate deficiency anemia: Secondary | ICD-10-CM | POA: Diagnosis not present

## 2017-10-28 DIAGNOSIS — Z885 Allergy status to narcotic agent status: Secondary | ICD-10-CM

## 2017-10-28 DIAGNOSIS — D638 Anemia in other chronic diseases classified elsewhere: Secondary | ICD-10-CM | POA: Diagnosis present

## 2017-10-28 DIAGNOSIS — N400 Enlarged prostate without lower urinary tract symptoms: Secondary | ICD-10-CM | POA: Diagnosis not present

## 2017-10-28 DIAGNOSIS — F209 Schizophrenia, unspecified: Secondary | ICD-10-CM | POA: Diagnosis present

## 2017-10-28 DIAGNOSIS — Z5189 Encounter for other specified aftercare: Secondary | ICD-10-CM | POA: Diagnosis not present

## 2017-10-28 DIAGNOSIS — Z9049 Acquired absence of other specified parts of digestive tract: Secondary | ICD-10-CM | POA: Diagnosis not present

## 2017-10-28 DIAGNOSIS — R5383 Other fatigue: Secondary | ICD-10-CM | POA: Diagnosis not present

## 2017-10-28 DIAGNOSIS — K31819 Angiodysplasia of stomach and duodenum without bleeding: Secondary | ICD-10-CM | POA: Diagnosis not present

## 2017-10-28 DIAGNOSIS — D649 Anemia, unspecified: Secondary | ICD-10-CM

## 2017-10-28 DIAGNOSIS — R52 Pain, unspecified: Secondary | ICD-10-CM | POA: Diagnosis not present

## 2017-10-28 DIAGNOSIS — G8929 Other chronic pain: Secondary | ICD-10-CM | POA: Diagnosis present

## 2017-10-28 DIAGNOSIS — Z79899 Other long term (current) drug therapy: Secondary | ICD-10-CM

## 2017-10-28 DIAGNOSIS — M549 Dorsalgia, unspecified: Secondary | ICD-10-CM | POA: Diagnosis not present

## 2017-10-28 DIAGNOSIS — F3289 Other specified depressive episodes: Secondary | ICD-10-CM | POA: Diagnosis not present

## 2017-10-28 DIAGNOSIS — Z825 Family history of asthma and other chronic lower respiratory diseases: Secondary | ICD-10-CM

## 2017-10-28 DIAGNOSIS — J90 Pleural effusion, not elsewhere classified: Secondary | ICD-10-CM | POA: Diagnosis not present

## 2017-10-28 DIAGNOSIS — M069 Rheumatoid arthritis, unspecified: Secondary | ICD-10-CM | POA: Diagnosis not present

## 2017-10-28 DIAGNOSIS — Z9842 Cataract extraction status, left eye: Secondary | ICD-10-CM | POA: Diagnosis not present

## 2017-10-28 DIAGNOSIS — M19072 Primary osteoarthritis, left ankle and foot: Secondary | ICD-10-CM | POA: Diagnosis present

## 2017-10-28 DIAGNOSIS — Z961 Presence of intraocular lens: Secondary | ICD-10-CM | POA: Diagnosis present

## 2017-10-28 DIAGNOSIS — Z7982 Long term (current) use of aspirin: Secondary | ICD-10-CM

## 2017-10-28 DIAGNOSIS — E785 Hyperlipidemia, unspecified: Secondary | ICD-10-CM | POA: Diagnosis present

## 2017-10-28 DIAGNOSIS — F17210 Nicotine dependence, cigarettes, uncomplicated: Secondary | ICD-10-CM | POA: Diagnosis present

## 2017-10-28 DIAGNOSIS — S32001A Stable burst fracture of unspecified lumbar vertebra, initial encounter for closed fracture: Secondary | ICD-10-CM | POA: Diagnosis not present

## 2017-10-28 DIAGNOSIS — I4891 Unspecified atrial fibrillation: Secondary | ICD-10-CM | POA: Diagnosis not present

## 2017-10-28 DIAGNOSIS — Z8249 Family history of ischemic heart disease and other diseases of the circulatory system: Secondary | ICD-10-CM

## 2017-10-28 DIAGNOSIS — Z743 Need for continuous supervision: Secondary | ICD-10-CM | POA: Diagnosis not present

## 2017-10-28 DIAGNOSIS — Z96652 Presence of left artificial knee joint: Secondary | ICD-10-CM | POA: Diagnosis present

## 2017-10-28 DIAGNOSIS — M546 Pain in thoracic spine: Secondary | ICD-10-CM | POA: Diagnosis not present

## 2017-10-28 DIAGNOSIS — R531 Weakness: Secondary | ICD-10-CM

## 2017-10-28 DIAGNOSIS — G4700 Insomnia, unspecified: Secondary | ICD-10-CM | POA: Diagnosis not present

## 2017-10-28 DIAGNOSIS — Z888 Allergy status to other drugs, medicaments and biological substances status: Secondary | ICD-10-CM | POA: Diagnosis not present

## 2017-10-28 DIAGNOSIS — E119 Type 2 diabetes mellitus without complications: Secondary | ICD-10-CM | POA: Diagnosis not present

## 2017-10-28 DIAGNOSIS — M6281 Muscle weakness (generalized): Secondary | ICD-10-CM | POA: Diagnosis not present

## 2017-10-28 DIAGNOSIS — K5520 Angiodysplasia of colon without hemorrhage: Secondary | ICD-10-CM | POA: Diagnosis present

## 2017-10-28 DIAGNOSIS — Z981 Arthrodesis status: Secondary | ICD-10-CM | POA: Diagnosis not present

## 2017-10-28 LAB — BASIC METABOLIC PANEL
Anion gap: 10 (ref 5–15)
BUN: 50 mg/dL — ABNORMAL HIGH (ref 6–20)
CO2: 22 mmol/L (ref 22–32)
Calcium: 8.5 mg/dL — ABNORMAL LOW (ref 8.9–10.3)
Chloride: 99 mmol/L — ABNORMAL LOW (ref 101–111)
Creatinine, Ser: 2.39 mg/dL — ABNORMAL HIGH (ref 0.61–1.24)
GFR calc Af Amer: 28 mL/min — ABNORMAL LOW (ref >60)
GFR calc non Af Amer: 24 mL/min — ABNORMAL LOW (ref >60)
Glucose, Bld: 117 mg/dL — ABNORMAL HIGH (ref 65–99)
Potassium: 4.7 mmol/L (ref 3.5–5.1)
Sodium: 131 mmol/L — ABNORMAL LOW (ref 135–145)

## 2017-10-28 LAB — CBC WITH DIFFERENTIAL/PLATELET
Basophils Absolute: 0 10*3/uL (ref 0–0.1)
Basophils Relative: 1 %
Eosinophils Absolute: 0.3 10*3/uL (ref 0–0.7)
Eosinophils Relative: 7 %
HCT: 18.8 % — ABNORMAL LOW (ref 40.0–52.0)
Hemoglobin: 6.2 g/dL — ABNORMAL LOW (ref 13.0–18.0)
Lymphocytes Relative: 25 %
Lymphs Abs: 1 10*3/uL (ref 1.0–3.6)
MCH: 27.8 pg (ref 26.0–34.0)
MCHC: 32.8 g/dL (ref 32.0–36.0)
MCV: 84.7 fL (ref 80.0–100.0)
Monocytes Absolute: 0.4 10*3/uL (ref 0.2–1.0)
Monocytes Relative: 11 %
Neutro Abs: 2.2 10*3/uL (ref 1.4–6.5)
Neutrophils Relative %: 56 %
Platelets: 185 10*3/uL (ref 150–440)
RBC: 2.22 MIL/uL — ABNORMAL LOW (ref 4.40–5.90)
RDW: 15.4 % — ABNORMAL HIGH (ref 11.5–14.5)
WBC: 3.9 10*3/uL (ref 3.8–10.6)

## 2017-10-28 LAB — PREPARE RBC (CROSSMATCH)

## 2017-10-28 MED ORDER — SODIUM CHLORIDE 0.9 % IV SOLN
Freq: Once | INTRAVENOUS | Status: AC
  Administered 2017-10-28: 20:00:00 via INTRAVENOUS

## 2017-10-28 MED ORDER — ATORVASTATIN CALCIUM 20 MG PO TABS
40.0000 mg | ORAL_TABLET | Freq: Every day | ORAL | Status: DC
  Administered 2017-10-29 – 2017-10-30 (×2): 40 mg via ORAL
  Filled 2017-10-28 (×2): qty 2

## 2017-10-28 MED ORDER — DONEPEZIL HCL 5 MG PO TABS
5.0000 mg | ORAL_TABLET | Freq: Every day | ORAL | Status: DC
  Administered 2017-10-29 – 2017-10-30 (×2): 5 mg via ORAL
  Filled 2017-10-28 (×4): qty 1

## 2017-10-28 MED ORDER — TAMSULOSIN HCL 0.4 MG PO CAPS
0.4000 mg | ORAL_CAPSULE | Freq: Every day | ORAL | Status: DC
  Administered 2017-10-29 – 2017-10-31 (×3): 0.4 mg via ORAL
  Filled 2017-10-28 (×3): qty 1

## 2017-10-28 MED ORDER — DOCUSATE SODIUM 100 MG PO CAPS: 100.0000 mg | ORAL_CAPSULE | Freq: Two times a day (BID) | ORAL | Status: DC | PRN

## 2017-10-28 MED ORDER — FOLIC ACID 1 MG PO TABS
1.0000 mg | ORAL_TABLET | Freq: Two times a day (BID) | ORAL | Status: DC
  Administered 2017-10-29 – 2017-10-31 (×5): 1 mg via ORAL
  Filled 2017-10-28 (×5): qty 1

## 2017-10-28 MED ORDER — CYCLOBENZAPRINE HCL 10 MG PO TABS
5.0000 mg | ORAL_TABLET | Freq: Three times a day (TID) | ORAL | Status: DC | PRN
  Administered 2017-10-29 – 2017-10-31 (×4): 5 mg via ORAL
  Filled 2017-10-28 (×4): qty 1

## 2017-10-28 MED ORDER — POLYETHYLENE GLYCOL 3350 17 GM/SCOOP PO POWD
0.5000 | Freq: Once | ORAL | Status: DC
  Administered 2017-10-29: 127.5 g via ORAL

## 2017-10-28 MED ORDER — GABAPENTIN 400 MG PO CAPS
400.0000 mg | ORAL_CAPSULE | Freq: Three times a day (TID) | ORAL | Status: DC
  Administered 2017-10-29 – 2017-10-31 (×8): 400 mg via ORAL
  Filled 2017-10-28 (×8): qty 1

## 2017-10-28 MED ORDER — HYDROCODONE-ACETAMINOPHEN 10-325 MG PO TABS
1.0000 | ORAL_TABLET | ORAL | Status: DC | PRN
  Administered 2017-10-29 – 2017-10-31 (×10): 1 via ORAL
  Filled 2017-10-28 (×11): qty 1

## 2017-10-28 MED ORDER — FENOFIBRATE 160 MG PO TABS
160.0000 mg | ORAL_TABLET | Freq: Every day | ORAL | Status: DC
  Administered 2017-10-29 – 2017-10-31 (×3): 160 mg via ORAL
  Filled 2017-10-28 (×4): qty 1

## 2017-10-28 MED ORDER — HYDROCORTISONE 1 % EX CREA
1.0000 "application " | TOPICAL_CREAM | Freq: Every day | CUTANEOUS | Status: DC | PRN
  Filled 2017-10-28: qty 28

## 2017-10-28 MED ORDER — TRAZODONE HCL 100 MG PO TABS
100.0000 mg | ORAL_TABLET | Freq: Every day | ORAL | Status: DC
  Administered 2017-10-29 – 2017-10-30 (×3): 100 mg via ORAL
  Filled 2017-10-28 (×3): qty 1

## 2017-10-28 MED ORDER — MORPHINE SULFATE (PF) 4 MG/ML IV SOLN
4.0000 mg | Freq: Once | INTRAVENOUS | Status: AC
Start: 2017-10-28 — End: 2017-10-28
  Administered 2017-10-28: 4 mg via INTRAVENOUS
  Filled 2017-10-28: qty 1

## 2017-10-28 MED ORDER — ASPIRIN EC 325 MG PO TBEC
325.0000 mg | DELAYED_RELEASE_TABLET | Freq: Every day | ORAL | Status: DC
Start: 2017-10-28 — End: 2017-10-31
  Administered 2017-10-29 – 2017-10-30 (×2): 325 mg via ORAL
  Filled 2017-10-28 (×2): qty 1

## 2017-10-28 MED ORDER — LORATADINE 10 MG PO TABS
10.0000 mg | ORAL_TABLET | Freq: Every day | ORAL | Status: DC
  Administered 2017-10-29 – 2017-10-31 (×3): 10 mg via ORAL
  Filled 2017-10-28 (×3): qty 1

## 2017-10-28 MED ORDER — SODIUM CHLORIDE 0.9 % IV SOLN: 10.0000 mL/h | Freq: Once | INTRAVENOUS | Status: DC

## 2017-10-28 MED ORDER — PANTOPRAZOLE SODIUM 40 MG PO TBEC
40.0000 mg | DELAYED_RELEASE_TABLET | Freq: Every day | ORAL | Status: DC
  Administered 2017-10-29 – 2017-10-31 (×3): 40 mg via ORAL
  Filled 2017-10-28 (×3): qty 1

## 2017-10-28 MED ORDER — MIRABEGRON ER 50 MG PO TB24
50.0000 mg | ORAL_TABLET | Freq: Every day | ORAL | Status: DC
  Administered 2017-10-29 – 2017-10-31 (×3): 50 mg via ORAL
  Filled 2017-10-28 (×4): qty 1

## 2017-10-28 MED ORDER — FLUOXETINE HCL 20 MG PO CAPS
20.0000 mg | ORAL_CAPSULE | Freq: Every day | ORAL | Status: DC
  Administered 2017-10-29 – 2017-10-31 (×3): 20 mg via ORAL
  Filled 2017-10-28 (×4): qty 1

## 2017-10-28 MED ORDER — SODIUM CHLORIDE 0.9 % IV SOLN
INTRAVENOUS | Status: DC
  Administered 2017-10-29: via INTRAVENOUS

## 2017-10-28 MED FILL — Sodium Chloride IV Soln 0.9%: INTRAVENOUS | Qty: 1000 | Status: AC

## 2017-10-28 MED FILL — Heparin Sodium (Porcine) Inj 1000 Unit/ML: INTRAMUSCULAR | Qty: 30 | Status: AC

## 2017-10-28 NOTE — ED Notes (Signed)
IV access established. Unable to draw blood off site. Waunita Schooner, EMT-P will attempt additional access to draw blood.

## 2017-10-28 NOTE — ED Notes (Signed)
ED Provider at bedside. 

## 2017-10-28 NOTE — Progress Notes (Signed)
Family Meeting Note  Advance Directive:yes  Today a meeting took place with the Patient.  The following clinical team members were present during this meeting:MD  The following were discussed:Patient's diagnosis: Symptomatic anemia, Ac renal failure, back surgeries , Patient's progosis: Unable to determine and Goals for treatment: Full Code  Additional follow-up to be provided: GI  Time spent during discussion:20 minutes  Vaughan Basta, MD

## 2017-10-28 NOTE — ED Triage Notes (Signed)
Pt arrives via [facility] from [facility] where he has been for rehab s/p l-spine surgery (5/30). Per [facility], pt's hemoglobin is 6.3. Pt c/o fatigue for several days. States he has been unable to eat well since surgery and was attributing his weakness to that.

## 2017-10-28 NOTE — ED Notes (Signed)
Admitting MD at bedside.

## 2017-10-28 NOTE — ED Notes (Signed)
Patient signed consent for blood

## 2017-10-28 NOTE — ED Notes (Signed)
This RN attempted IV access x2. Will request assistance.

## 2017-10-28 NOTE — ED Provider Notes (Signed)
[facility] Emergency Department Provider Note   ____________________________________________   I have reviewed the triage vital signs and the nursing notes.   HISTORY  Chief Complaint Fatigue   History limited by: Not Limited   HPI Bryan [name] is a 77 y.o. male who presents to the emergency department today because hemoglobin found decreased versus was 6.1.  Patient states he has felt fatigued over the past couple days.  He recently had lumbar surgery done at [facility].  Since leaving he states he has been tired.  He denies any associated chest pain or shortness of breath.  Does state that he has had anemia in the past and has required blood transfusions in the past.  He thinks his last was possibly 1 year ago.  He does not know why he has been anemic in the past.  He states he does take 325 aspirin every day.   Per medical record review patient has a history of iron deficiency anemia.   Past Medical History:  Diagnosis Date  . Abnormal CT scan    Asymmetric left rectal wall thickening   . Anemia   . Anxiety   . Arrhythmia   . Chronic lower back pain   . Complication of anesthesia    Memory loss 09/2015  . Coronary artery disease   . Depression   . GERD (gastroesophageal reflux disease)   . Glaucoma   . H/O thoracic aortic aneurysm repair   . Heart murmur   . History of being hospitalized    memory lose kidney funtion down blood pressure up  . History of blood transfusion    "think he had one when he had heart valve OR" (10/14/2016); "none since" (10/19/2017)  . History of chicken pox   . Hyperlipidemia   . Hypertension   . Lumbar stenosis   . Neuropathy   . Osteoarthritis    worse in feet and ankles  . Paroxysmal A-fib ([place])   . Poor short term memory    takes Aricept  . Rheumatoid arthritis ([place])    "all over" (10/14/2016)  . Schizophrenia ([place])   . Seasonal allergies   . Valvular heart disease     Patient Active Problem List    Diagnosis Date Noted  . Fracture lumbar vertebra-closed ([place]) 10/20/2017  . Pseudoarthrosis of lumbar spine 09/28/2017  . Anxiety 06/24/2017  . Lumbar burst fracture, closed, initial encounter ([place]) 06/08/2017  . Conjunctivitis, bacterial 03/22/2017  . Urge incontinence 12/15/2016  . Ataxia 11/09/2016  . Insomnia 11/09/2016  . Transient memory loss 11/09/2016  . Nausea and vomiting 10/15/2016  . Spinal stenosis of lumbar region 10/14/2016  . Leukopenia 09/27/2016  . Thrombocytopenia ([place]) 09/27/2016  . Other pancytopenia ([place]) 09/27/2016  . History of left knee replacement 09/01/2016  . Fall 09/01/2016  . HNP (herniated nucleus pulposus with myelopathy), thoracic 03/08/2016  . Rectal nodule 02/23/2016  . DDD (degenerative disc disease), lumbar 01/02/2016  . Lumbar radiculopathy 01/02/2016  . Facet syndrome, lumbar 01/02/2016  . Greater trochanteric bursitis 11/28/2015  . Acute renal failure ([place]) 11/06/2015  . Dehydration 10/17/2015  . IBS (irritable bowel syndrome) 07/15/2015  . Erectile dysfunction of organic origin 07/15/2015  . Carotid stenosis 05/06/2015  . Difficulty sleeping 04/15/2015  . 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 Chronic kidney disease 02/17/2015  . 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  . Enlarged prostate with lower urinary tract symptoms (LUTS) 02/17/2015  . Rheumatoid arthritis ([place]) 10/01/2014  .  Sacroiliac joint disease 10/01/2014  . Fusion of spine, sacral and sacrococcygeal region 10/01/2014  . DDD (degenerative disc disease), cervical 10/01/2014  . Lumbar and sacral osteoarthritis 10/01/2014  . Mechanical and motor problems with internal organs 06/20/2014  . Iron deficiency anemia 06/19/2014  . Anemia 06/19/2014  . Aneurysm, ascending aorta ([place]) 05/27/2014  . Aneurysm of thoracic aorta ([place]) 05/27/2014  . Thoracic aortic aneurysm without rupture ([place]) 05/27/2014  . Cognitive decline 04/01/2014  .  Spinal stenosis of lumbar region with radiculopathy 01/30/2014  . Vitamin D deficiency 01/30/2014  . Dorsalgia, unspecified 01/30/2014  . Lumbar canal stenosis 01/30/2014  . Paroxysmal atrial fibrillation ([place]) 11/27/2013  . S/P AVR (aortic valve replacement) 11/27/2013  . History of ascending aorta repair 11/27/2013  . Hyperlipidemia 11/27/2013  . Essential hypertension 11/27/2013  . Bicuspid aortic valve 11/27/2013  . Atherosclerotic heart disease of native coronary artery without angina pectoris 11/27/2013  . 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 History of open heart surgery 11/27/2013  . Carotid artery insufficiency syndrome 11/06/2010    Past Surgical History:  Procedure Laterality Date  . AORTIC VALVE REPLACEMENT  2007   [facility].  [place], [place]; "pig valve"  . APPENDECTOMY  2010  . BACK SURGERY    . CARDIAC VALVE REPLACEMENT    . CATARACT EXTRACTION W/ INTRAOCULAR LENS  IMPLANT, BILATERAL Bilateral 2012  . COLONOSCOPY WITH PROPOFOL N/A 09/24/2016   Procedure: COLONOSCOPY WITH PROPOFOL;  Surgeon: Jonathon Bellows, MD;  Location: [facility] ENDOSCOPY;  Service: Endoscopy;  Laterality: N/A;  . ESOPHAGOGASTRODUODENOSCOPY (EGD) WITH PROPOFOL N/A 09/24/2016   Procedure: ESOPHAGOGASTRODUODENOSCOPY (EGD) WITH PROPOFOL;  Surgeon: Jonathon Bellows, MD;  Location: [facility] ENDOSCOPY;  Service: Endoscopy;  Laterality: N/A;  . GIVENS CAPSULE STUDY N/A 09/24/2016   Procedure: GIVENS CAPSULE STUDY;  Surgeon: Jonathon Bellows, MD;  Location: [facility] ENDOSCOPY;  Service: Endoscopy;  Laterality: N/A;  . HAMMER TOE SURGERY Right 10/09/2015   Procedure: HAMMER TOE REPAIR WITH K-WIRE FIXATION RIGHT SECOND TOE;  Surgeon: Albertine Patricia, DPM;  Location: [place];  Service: Podiatry;  Laterality: Right;  WITH LOCAL  . JOINT REPLACEMENT    . LAMINECTOMY WITH POSTERIOR LATERAL ARTHRODESIS LEVEL 3 N/A 09/28/2017   Procedure: LUMBAR  POSTEROR  FUSION REVISION - LUMBAR ONE -TWO, LUMBAR TWO  -THREE,THREE-FOUR ,BILATERAL ARTHRODESIS REMOVAL LUMBAR THREE HARDWARE;  Surgeon: Kary Kos, MD;  Location: [place];  Service: Neurosurgery;  Laterality: N/A;  . LAMINECTOMY WITH POSTERIOR LATERAL ARTHRODESIS LEVEL 3 N/A 10/20/2017   Procedure: Revision fusion and removal of hardware Lumbar one, Pedicle screw fixation thoracic ten-lumbar two, thoracic nine-ten laminotomy, Posterior Lumbar Arthrodesis thoracic ten-lumbar two ;  Surgeon: Kary Kos, MD;  Location: [place];  Service: Neurosurgery;  Laterality: N/A;  . LAPAROSCOPIC CHOLECYSTECTOMY  2010  . LUMBAR FUSION Right 06/08/2017   LUMBAR THREE-FOUR, LUMBAR FOUR-FIVE POSTEROLATERAL ARTHRODESIS WITH RIGHT LUMBAR FOUR-FIVE LAMINECTOMY/FORAMINOTOMY  . LUMBAR LAMINECTOMY/DECOMPRESSION MICRODISCECTOMY Right 03/08/2016   Procedure: Laminectomy and Foraminotomy - Lumbar Five-Sacral One Right;  Surgeon: Kary Kos, MD;  Location: [place];  Service: Neurosurgery;  Laterality: Right;  Right  . MULTIPLE TOOTH EXTRACTIONS     "3"  . POSTERIOR LUMBAR FUSION  10/14/2016   L5-S1  . REPLACEMENT TOTAL KNEE Left 2003  . THORACIC AORTIC ANEURYSM REPAIR  2010    Prior to Admission medications   Medication Sig Start Date End Date Taking? Authorizing Provider  amLODipine (NORVASC) 5 MG tablet TAKE ONE TABLET BY MOUTH EVERY DAY 06/14/17   [name]  G, MD  amoxicillin (AMOXIL) 500 MG capsule Take 2,000 mg by mouth See admin instructions. Take 4 Capsules prior to dental appointment 12/24/16   [provider]  aspirin 325 MG EC tablet Take 325 mg by mouth daily at 2 PM.     [provider]  atorvastatin (LIPITOR) 40 MG tablet Take 1 tablet (40 mg total) by mouth daily. Patient taking differently: Take 40 mg by mouth at bedtime.  12/15/16   [name], MD  cetirizine (ZYRTEC) 10 MG tablet Take 10 mg by mouth daily.    [provider]  cyclobenzaprine (FLEXERIL) 10 MG tablet Take 1 tablet (10 mg total) by mouth 3 (three) times daily as  needed for muscle spasms. 10/01/17   Meyran, Ocie Cornfield, NP  cyclobenzaprine (FLEXERIL) 5 MG tablet Take 1 tablet (5 mg total) by mouth 3 (three) times daily as needed for muscle spasms. 10/24/17   Meyran, Ocie Cornfield, NP  donepezil (ARICEPT) 5 MG tablet Take 5 mg by mouth at bedtime.     [provider]  fenofibrate 160 MG tablet TAKE ONE TABLET BY MOUTH EVERY DAY Patient taking differently: TAKE ONE TABLET BY MOUTH EVERY DAY AT BEDTIME 08/29/17   Minna Merritts, MD  FLUoxetine (PROZAC) 20 MG capsule TAKE 1 CAPSULE BY MOUTH EVERY DAY 09/23/17   [name], MD  folic acid (FOLVITE) 1 MG tablet Take 1 mg by mouth 2 (two) times daily.    [provider]  gabapentin (NEURONTIN) 400 MG capsule Take 400 mg by mouth 3 (three) times daily.     [provider]  HYDROcodone-acetaminophen (NORCO) 10-325 MG tablet Take 1 tablet by mouth every 4 (four) hours as needed for moderate pain. 06/10/17   Meyran, Ocie Cornfield, NP  HYDROcodone-acetaminophen (NORCO) 10-325 MG tablet Take 1 tablet by mouth every 8 (eight) hours as needed. Patient not taking: Reported on 10/20/2017 10/01/17   Meyran, Ocie Cornfield, NP  HYDROcodone-acetaminophen (NORCO/VICODIN) 5-325 MG tablet Take 2 tablets by mouth every 4 (four) hours as needed for moderate pain. 10/24/17 10/24/18  Meyran, Ocie Cornfield, NP  hydrocortisone cream 1 % Apply 1 application topically daily as needed for itching.    [provider]  lisinopril (PRINIVIL,ZESTRIL) 20 MG tablet TAKE ONE TABLET BY MOUTH EVERY EVENING 12/24/16   Gollan, Kathlene November, MD  MYRBETRIQ 50 MG TB24 tablet Take 50 mg by mouth daily. 09/12/17   [provider]  omeprazole (PRILOSEC) 40 MG capsule TAKE 1 CAPSULE BY MOUTH DAILY Patient taking differently: TAKE 1 CAPSULE BY MOUTH DAILY WITH SUPPER 02/28/17   Jonathon Bellows, MD  polyethylene glycol powder (GLYCOLAX/MIRALAX) powder USE 1 CAPFUL IN 4 TO 8 OUNCES OF LIQUID DAILY 06/24/17   [name], MD  tamsulosin (FLOMAX) 0.4 MG CAPS capsule TAKE 1 CAPSULE BY MOUTH DAILY Patient taking differently: TAKE 1 CAPSULE BY MOUTH DAILY AT SUPPER 04/29/17   Crecencio Mc, MD  testosterone cypionate (DEPOTESTOSTERONE CYPIONATE) 200 MG/ML injection INJECT 13mL INTO THE MUSCLE EVERY 14 DAYS 07/29/17   McGowan, Larene Beach A, PA-C  traZODone (DESYREL) 50 MG tablet Take 100 mg by mouth at bedtime.  04/15/15   [provider]    Allergies Penicillins and Demerol [meperidine]  Family History  Problem Relation Age of Onset  . Heart failure Mother   . Hypertension Mother   . Asthma Mother   . Heart attack Father 8       MI  . Hypertension Sister   .  Prostate cancer Neg Hx   . Bladder Cancer Neg Hx   . Kidney cancer Neg Hx     Social History Social History   Tobacco Use  . Smoking status: Former Smoker    Packs/day: 1.00    Years: 32.00    Pack years: 32.00    Types: Cigarettes    Last attempt to quit: 07/05/1981    Years since quitting: 36.3  . Smokeless tobacco: Never Used  Substance Use Topics  . Alcohol use: Yes    Comment: 10/19/2017 "glass of wine/month; if that"  . Drug use: Never    Review of Systems Constitutional: No fever/chills. Positive for fatigue. Eyes: No visual changes. ENT: No sore throat. Cardiovascular: Denies chest pain. Respiratory: Denies shortness of breath. Gastrointestinal: No abdominal pain.  No nausea, no vomiting.  No diarrhea.   Genitourinary: Negative for dysuria. Musculoskeletal: Positive for back pain. Skin: Negative for rash. Neurological: Negative for headaches, focal weakness or numbness.  ____________________________________________   PHYSICAL EXAM:  VITAL SIGNS: ED Triage Vitals  Enc Vitals Group     BP 10/28/17 1805 (!) 124/58     Pulse Rate 10/28/17 1805 76     Resp 10/28/17 1805 19     Temp 10/28/17 1805 97.8 F (36.6 C)     Temp Source 10/28/17 1805 Oral     SpO2 10/28/17 1805 100 %     Weight 10/28/17 1808 186  lb (84.4 kg)     Height 10/28/17 1808 5\' 11"  (1.803 m)     Head Circumference --      [facility] --      Pain Score 10/28/17 1807 6   Constitutional: Alert and oriented.  Eyes: Conjunctivae are normal.  ENT      Head: Normocephalic and atraumatic.      Nose: No congestion/rhinnorhea.      Mouth/Throat: Mucous membranes are moist.      Neck: No stridor. Hematological/Lymphatic/Immunilogical: No cervical lymphadenopathy. Cardiovascular: Normal rate, regular rhythm.  No murmurs, rubs, or gallops.  Respiratory: Normal respiratory effort without tachypnea nor retractions. Breath sounds are clear and equal bilaterally. No wheezes/rales/rhonchi. Gastrointestinal: Soft and non tender. No rebound. No guarding.  Genitourinary: Deferred Musculoskeletal: Normal range of motion in all extremities. No lower extremity edema. Neurologic:  Normal speech and language. No gross focal neurologic deficits are appreciated.  Skin:  Surgical incision site c/d/i no surrounding erythema or bruising.  Psychiatric: Mood and affect are normal. Speech and behavior are normal. Patient exhibits appropriate insight and judgment.  ____________________________________________    LABS (pertinent positives/negatives)  Cbc wbc 3.9, hgb 6.2, plt 185 BMP na 131, k 4.7, glu 117, cr 2.39  ____________________________________________   EKG  I, Nance Pear, attending physician, personally viewed and interpreted this EKG  EKG Time: 1806 Rate: 74 Rhythm: sinus rhythm with 1st degree av block Axis: left axis deviation Intervals: qtc 449 QRS: narrow, q waves v1 ST changes: no st elevation Impression: abnormal ekg   ____________________________________________    RADIOLOGY  None  ____________________________________________   PROCEDURES  Procedures  CRITICAL CARE Performed by: Nance Pear   Total critical care time: 35 minutes  Critical care time was exclusive of separately billable  procedures and treating other patients.  Critical care was necessary to treat or prevent imminent or life-threatening deterioration.  Critical care was time spent personally by me on the following activities: development of treatment plan with patient and/or surrogate as well as nursing, discussions with consultants, evaluation of patient's response  to treatment, examination of patient, obtaining history from patient or surrogate, ordering and performing treatments and interventions, ordering and review of laboratory studies, ordering and review of radiographic studies, pulse oximetry and re-evaluation of patient's condition.  ____________________________________________   INITIAL IMPRESSION / ASSESSMENT AND PLAN / ED COURSE  Pertinent labs & imaging results that were available during my care of the patient were reviewed by me and considered in my medical decision making (see chart for details).   Patient presented to the emergency department today from [facility] because of concerns for fatigue and anemia.  Blood work at our facility shows a hemoglobin of 6.2.  Patient does have a history of anemia in the past and per chart review this is secondary to iron deficiency anemia.  Did have a discussion with the patient.  Did consent patient for packed blood cell transfusion.  In addition blood work also shows acute kidney injury.  This could be due to his dehydration or potentially anemia itself.  Patient did recently have thoracic surgery incision site appears well with no bleeding.  No bruising around the site.  Discussed with hospitalist for admission.  Discussed plan and findings with patient.   ____________________________________________   FINAL CLINICAL IMPRESSION(S) / ED DIAGNOSES  Final diagnoses:  Other fatigue  Anemia, unspecified type  AKI (acute kidney injury) ([place])     Note: This dictation was prepared with Dragon dictation. Any transcriptional errors that result from this  process are unintentional     Nance Pear, MD 10/28/17 2014

## 2017-10-28 NOTE — H&P (Addendum)
[facility] [place] NAME: Bryan [name]    MR#:  [id number]  DATE OF BIRTH:  Jun 04, 1940  DATE OF ADMISSION:  10/28/2017  PRIMARY CARE PHYSICIAN: [name], MD   REQUESTING/REFERRING PHYSICIAN: goodman  CHIEF COMPLAINT:   Chief Complaint  Patient presents with  . Fatigue    HISTORY OF PRESENT ILLNESS: Bryan [name]  is a 77 y.o. male with a known history of anxiety, ch back pain, CAD, aortic valve replacement, hypertension, HLD- had back surgery done last week for a fusion of his spines, sent to a rehabilitation Center. He had been on stool softeners but still constipated. Felt significantly weak for last 2 days and so sent to emergency room. He complains of generalized weakness, still able to get up and walk but have excessive weakness. Noted to have significant drop in hemoglobin in last 1 week. Patient denies noticing any active bleeding. ER physician ordered 1 unit of transfusion and water to hospitalist team to admit the patient.  PAST MEDICAL HISTORY:   Past Medical History:  Diagnosis Date  . Abnormal CT scan    Asymmetric left rectal wall thickening   . Anemia   . Anxiety   . Arrhythmia   . Chronic lower back pain   . Complication of anesthesia    Memory loss 09/2015  . Coronary artery disease   . Depression   . GERD (gastroesophageal reflux disease)   . Glaucoma   . H/O thoracic aortic aneurysm repair   . Heart murmur   . History of being hospitalized    memory lose kidney funtion down blood pressure up  . History of blood transfusion    "think he had one when he had heart valve OR" (10/14/2016); "none since" (10/19/2017)  . History of chicken pox   . Hyperlipidemia   . Hypertension   . Lumbar stenosis   . Neuropathy   . Osteoarthritis    worse in feet and ankles  . Paroxysmal A-fib ([place])   . Poor short term memory    takes Aricept  . Rheumatoid arthritis ([place])    "all over" (10/14/2016)  .  Schizophrenia ([place])   . Seasonal allergies   . Valvular heart disease     PAST SURGICAL HISTORY:  Past Surgical History:  Procedure Laterality Date  . AORTIC VALVE REPLACEMENT  2007   [facility].  Supply, ; "pig valve"  . APPENDECTOMY  2010  . BACK SURGERY    . CARDIAC VALVE REPLACEMENT    . CATARACT EXTRACTION W/ INTRAOCULAR LENS  IMPLANT, BILATERAL Bilateral 2012  . COLONOSCOPY WITH PROPOFOL N/A 09/24/2016   Procedure: COLONOSCOPY WITH PROPOFOL;  Surgeon: Jonathon Bellows, MD;  Location: [facility] ENDOSCOPY;  Service: Endoscopy;  Laterality: N/A;  . ESOPHAGOGASTRODUODENOSCOPY (EGD) WITH PROPOFOL N/A 09/24/2016   Procedure: ESOPHAGOGASTRODUODENOSCOPY (EGD) WITH PROPOFOL;  Surgeon: Jonathon Bellows, MD;  Location[facility] ENDOSCOPY;  Service: Endoscopy;  Laterality: N/A;  . GIVENS CAPSULE STUDY N/A 09/24/2016   Procedure: GIVENS CAPSULE STUDY;  Surgeon: Jonathon Bellows, MD;  Location: [facility] ENDOSCOPY;  Service: Endoscopy;  Laterality: N/A;  . HAMMER TOE SURGERY Right 10/09/2015   Procedure: HAMMER TOE REPAIR WITH K-WIRE FIXATION RIGHT SECOND TOE;  Surgeon: Albertine Patricia, DPM;  Location: [place];  Service: Podiatry;  Laterality: Right;  WITH LOCAL  . JOINT REPLACEMENT    . LAMINECTOMY WITH POSTERIOR LATERAL ARTHRODESIS LEVEL 3 N/A 09/28/2017   Procedure: LUMBAR  POSTEROR  FUSION REVISION - LUMBAR  ONE -TWO, LUMBAR TWO -THREE,THREE-FOUR ,BILATERAL ARTHRODESIS REMOVAL LUMBAR THREE HARDWARE;  Surgeon: Kary Kos, MD;  Location: [place];  Service: Neurosurgery;  Laterality: N/A;  . LAMINECTOMY WITH POSTERIOR LATERAL ARTHRODESIS LEVEL 3 N/A 10/20/2017   Procedure: Revision fusion and removal of hardware Lumbar one, Pedicle screw fixation thoracic ten-lumbar two, thoracic nine-ten laminotomy, Posterior Lumbar Arthrodesis thoracic ten-lumbar two ;  Surgeon: Kary Kos, MD;  Location: [place];  Service: Neurosurgery;  Laterality: N/A;  . LAPAROSCOPIC CHOLECYSTECTOMY  2010  . LUMBAR FUSION Right 06/08/2017    LUMBAR THREE-FOUR, LUMBAR FOUR-FIVE POSTEROLATERAL ARTHRODESIS WITH RIGHT LUMBAR FOUR-FIVE LAMINECTOMY/FORAMINOTOMY  . LUMBAR LAMINECTOMY/DECOMPRESSION MICRODISCECTOMY Right 03/08/2016   Procedure: Laminectomy and Foraminotomy - Lumbar Five-Sacral One Right;  Surgeon: Kary Kos, MD;  Location: [place];  Service: Neurosurgery;  Laterality: Right;  Right  . MULTIPLE TOOTH EXTRACTIONS     "3"  . POSTERIOR LUMBAR FUSION  10/14/2016   L5-S1  . REPLACEMENT TOTAL KNEE Left 2003  . THORACIC AORTIC ANEURYSM REPAIR  2010    SOCIAL HISTORY:  Social History   Tobacco Use  . Smoking status: Former Smoker    Packs/day: 1.00    Years: 32.00    Pack years: 32.00    Types: Cigarettes    Last attempt to quit: 07/05/1981    Years since quitting: 36.3  . Smokeless tobacco: Never Used  Substance Use Topics  . Alcohol use: Yes    Comment: 10/19/2017 "glass of wine/month; if that"    FAMILY HISTORY:  Family History  Problem Relation Age of Onset  . Heart failure Mother   . Hypertension Mother   . Asthma Mother   . Heart attack Father 4       MI  . Hypertension Sister   . Prostate cancer Neg Hx   . Bladder Cancer Neg Hx   . Kidney cancer Neg Hx     DRUG ALLERGIES:  Allergies  Allergen Reactions  . Penicillins Hives, Swelling and Other (See Comments)    SWELLING REACTION UNSPECIFIED PATIENT HAS TAKEN AMOXICILLIN ON MED HX FROM [facility] PATIENT HAS HAD A PCN REACTION WITH IMMEDIATE RASH, FACIAL/TONGUE/THROAT SWELLING, SOB, OR LIGHTHEADEDNESS WITH HYPOTENSION:  #  #  #  YES  #  #  #   Has patient had a PCN reaction causing severe rash involving mucus membranes or skin necrosis: No Has patient had a PCN reaction that required hospitalization No Has patient had a PCN reaction occurring within the last 10 years: No  . Demerol [Meperidine] Hives and Nausea And Vomiting    REVIEW OF SYSTEMS:   CONSTITUTIONAL: No fever,have fatigue or weakness.  EYES: No blurred or double vision.  EARS, NOSE, AND  THROAT: No tinnitus or ear pain.  RESPIRATORY: No cough, shortness of breath, wheezing or hemoptysis.  CARDIOVASCULAR: No chest pain, orthopnea, edema.  GASTROINTESTINAL: No nausea, vomiting, diarrhea or abdominal pain.  GENITOURINARY: No dysuria, hematuria.  ENDOCRINE: No polyuria, nocturia,  HEMATOLOGY: No anemia, easy bruising or bleeding SKIN: No rash or lesion. MUSCULOSKELETAL: No joint pain or arthritis.   NEUROLOGIC: No tingling, numbness, weakness.  PSYCHIATRY: No anxiety or depression.   MEDICATIONS AT HOME:  Prior to Admission medications   Medication Sig Start Date End Date Taking? Authorizing Provider  amLODipine (NORVASC) 5 MG tablet TAKE ONE TABLET BY MOUTH EVERY DAY 06/14/17   [name], MD  amoxicillin (AMOXIL) 500 MG capsule Take 2,000 mg by mouth See admin instructions. Take 4 Capsules prior to dental appointment 12/24/16  [provider]  aspirin 325 MG EC tablet Take 325 mg by mouth daily at 2 PM.     [provider]  atorvastatin (LIPITOR) 40 MG tablet Take 1 tablet (40 mg total) by mouth daily. Patient taking differently: Take 40 mg by mouth at bedtime.  12/15/16   [name], MD  cetirizine (ZYRTEC) 10 MG tablet Take 10 mg by mouth daily.    [provider]  cyclobenzaprine (FLEXERIL) 10 MG tablet Take 1 tablet (10 mg total) by mouth 3 (three) times daily as needed for muscle spasms. 10/01/17   Meyran, Ocie Cornfield, NP  cyclobenzaprine (FLEXERIL) 5 MG tablet Take 1 tablet (5 mg total) by mouth 3 (three) times daily as needed for muscle spasms. 10/24/17   Meyran, Ocie Cornfield, NP  donepezil (ARICEPT) 5 MG tablet Take 5 mg by mouth at bedtime.     [provider]  fenofibrate 160 MG tablet TAKE ONE TABLET BY MOUTH EVERY DAY Patient taking differently: TAKE ONE TABLET BY MOUTH EVERY DAY AT BEDTIME 08/29/17   Minna Merritts, MD  FLUoxetine (PROZAC) 20 MG capsule TAKE 1 CAPSULE BY MOUTH EVERY DAY 09/23/17   [name], MD  folic acid (FOLVITE) 1 MG tablet Take 1 mg by mouth 2 (two) times daily.    [provider]  gabapentin (NEURONTIN) 400 MG capsule Take 400 mg by mouth 3 (three) times daily.     [provider]  HYDROcodone-acetaminophen (NORCO) 10-325 MG tablet Take 1 tablet by mouth every 4 (four) hours as needed for moderate pain. 06/10/17   Meyran, Ocie Cornfield, NP  HYDROcodone-acetaminophen (NORCO) 10-325 MG tablet Take 1 tablet by mouth every 8 (eight) hours as needed. Patient not taking: Reported on 10/20/2017 10/01/17   Meyran, Ocie Cornfield, NP  HYDROcodone-acetaminophen (NORCO/VICODIN) 5-325 MG tablet Take 2 tablets by mouth every 4 (four) hours as needed for moderate pain. 10/24/17 10/24/18  Meyran, Ocie Cornfield, NP  hydrocortisone cream 1 % Apply 1 application topically daily as needed for itching.    [provider]  lisinopril (PRINIVIL,ZESTRIL) 20 MG tablet TAKE ONE TABLET BY MOUTH EVERY EVENING 12/24/16   Gollan, Kathlene November, MD  MYRBETRIQ 50 MG TB24 tablet Take 50 mg by mouth daily. 09/12/17   [provider]  omeprazole (PRILOSEC) 40 MG capsule TAKE 1 CAPSULE BY MOUTH DAILY Patient taking differently: TAKE 1 CAPSULE BY MOUTH DAILY WITH SUPPER 02/28/17   Jonathon Bellows, MD  polyethylene glycol powder (GLYCOLAX/MIRALAX) powder USE 1 CAPFUL IN 4 TO 8 OUNCES OF LIQUID DAILY 06/24/17   [name], MD  tamsulosin (FLOMAX) 0.4 MG CAPS capsule TAKE 1 CAPSULE BY MOUTH DAILY Patient taking differently: TAKE 1 CAPSULE BY MOUTH DAILY AT SUPPER 04/29/17   Crecencio Mc, MD  testosterone cypionate (DEPOTESTOSTERONE CYPIONATE) 200 MG/ML injection INJECT 43mL INTO THE MUSCLE EVERY 14 DAYS 07/29/17   McGowan, Larene Beach A, PA-C  traZODone (DESYREL) 50 MG tablet Take 100 mg by mouth at bedtime.  04/15/15   [provider]      PHYSICAL EXAMINATION:   VITAL SIGNS: Blood pressure 116/60, pulse 88, temperature 98.5 F (36.9 C), temperature source Oral, resp. rate  14, height 5\' 11"  (1.803 m), weight 84.4 kg (186 lb), SpO2 100 %.  GENERAL:  77 y.o.-year-old patient lying in the bed with no acute distress.  EYES: Pupils equal, round, reactive to light and accommodation. No scleral icterus. Extraocular muscles intact.  HEENT: Head atraumatic, normocephalic. Oropharynx and nasopharynx  clear.  NECK:  Supple, no jugular venous distention. No thyroid enlargement, no tenderness.  LUNGS: Normal breath sounds bilaterally, no wheezing, rales,rhonchi or crepitation. No use of accessory muscles of respiration.  CARDIOVASCULAR: S1, S2 normal. No murmurs, rubs, or gallops.  ABDOMEN: Soft, nontender, nondistended. Bowel sounds present. No organomegaly or mass.  EXTREMITIES: No pedal edema, cyanosis, or clubbing. On the mid back he has a recent surgical scar without any active bleeding or swelling around there , were no any secretions. NEUROLOGIC: Cranial nerves II through XII are intact. Muscle strength 4/5 in all extremities. Sensation intact. Gait not checked.  PSYCHIATRIC: The patient is alert and oriented x 3.  SKIN: No obvious rash, lesion, or ulcer.   LABORATORY PANEL:   CBC Recent Labs  Lab 10/28/17 1828  WBC 3.9  HGB 6.2*  HCT 18.8*  PLT 185  MCV 84.7  MCH 27.8  MCHC 32.8  RDW 15.4*  LYMPHSABS 1.0  MONOABS 0.4  EOSABS 0.3  BASOSABS 0.0   ------------------------------------------------------------------------------------------------------------------  Chemistries  Recent Labs  Lab 10/28/17 1828  NA 131*  K 4.7  CL 99*  CO2 22  GLUCOSE 117*  BUN 50*  CREATININE 2.39*  CALCIUM 8.5*   ------------------------------------------------------------------------------------------------------------------ estimated creatinine clearance is 26.7 mL/min (A) (by C-G formula based on SCr of 2.39 mg/dL (H)). ------------------------------------------------------------------------------------------------------------------ No results for input(s): TSH,  T4TOTAL, T3FREE, THYROIDAB in the last 72 hours.  Invalid input(s): FREET3   Coagulation profile No results for input(s): INR, PROTIME in the last 168 hours. ------------------------------------------------------------------------------------------------------------------- No results for input(s): DDIMER in the last 72 hours. -------------------------------------------------------------------------------------------------------------------  Cardiac Enzymes No results for input(s): CKMB, TROPONINI, MYOGLOBIN in the last 168 hours.  Invalid input(s): CK ------------------------------------------------------------------------------------------------------------------ Invalid input(s): POCBNP  ---------------------------------------------------------------------------------------------------------------  Urinalysis    Component Value Date/Time   COLORURINE YELLOW 10/16/2016 0127   APPEARANCEUR CLEAR 10/16/2016 0127   APPEARANCEUR Clear 03/28/2014 1145   LABSPEC 1.013 10/16/2016 0127   LABSPEC 1.016 03/28/2014 1145   PHURINE 5.0 10/16/2016 0127   GLUCOSEU NEGATIVE 10/16/2016 0127   GLUCOSEU Negative 03/28/2014 1145   HGBUR NEGATIVE 10/16/2016 0127   BILIRUBINUR NEGATIVE 10/16/2016 0127   BILIRUBINUR Small 11/21/2015 1620   BILIRUBINUR Negative 03/28/2014 1145   KETONESUR NEGATIVE 10/16/2016 0127   PROTEINUR 30 (A) 10/16/2016 0127   UROBILINOGEN 0.2 11/21/2015 1620   NITRITE NEGATIVE 10/16/2016 0127   LEUKOCYTESUR TRACE (A) 10/16/2016 0127   LEUKOCYTESUR Negative 03/28/2014 1145     RADIOLOGY: No results found.  EKG: Orders placed or performed during the hospital encounter of 10/28/17  . EKG 12-Lead  . EKG 12-Lead    IMPRESSION AND PLAN:  * symptomatic anemia    Patient denies using over-the-counter pain medications or NSAIDs.    Denies any active bleeding.    There is no bleed or swelling or excessive tenderness around his recent surgical site.    His colonoscopy  was last year with some polyps removal.    ER physician ordered 1 unit of transfusion, I will get GI consult.    I will hold anticoagulation at this time because of severe anemia.  * recent lumbar and thoracic spinal surgery   I will get an x-ray to rule out any acute abnormality is locally.   If we see some, call orthopedic consult.   Continue pain medications for now.  * Ac renal failure   Monitor, Hold htn meds.   Get UA and renal US.  * Constipation    Continue MiraLAX for now.  *  hypertension   Blood pressure is borderline, we may help to watch carefully.    All the records are reviewed and case discussed with ED provider. Management plans discussed with the patient, family and they are in agreement.  CODE STATUS:full code Code Status History    Date Active Date Inactive Code Status Order ID Comments User Context   10/19/2017 1935 10/24/2017 1753 Full Code [id number]  Kary Kos, MD Inpatient   09/28/2017 1701 10/01/2017 1726 Full Code [id number]  Kary Kos, MD Inpatient   06/08/2017 1712 06/10/2017 1904 Full Code [id number]  Kary Kos, MD Inpatient   10/14/2016 1541 10/18/2016 1547 Full Code [id number]  Kary Kos, MD Inpatient   03/08/2016 1334 03/08/2016 2249 Full Code [id number]  Kary Kos, MD Inpatient   10/11/2015 2213 10/13/2015 1730 DNR [id number]  Nicholes Mango, MD ED    Advance Directive Documentation     Most Recent Value  Type of Advance Directive  Healthcare Power of Attorney, Living will  Pre-existing out of facility DNR order (yellow form or pink MOST form)  -  "MOST" Form in Place?  -       TOTAL TIME TAKING CARE OF THIS PATIENT: 45 minutes.    Vaughan Basta M.D on 10/28/2017   Between 7am to 6pm - Pager - [phone]  After 6pm go to [email or web address] - password [email or web address] [place]  Office  [phone]  CC: Primary care physician; [name], MD   Note: This dictation was prepared with Dragon dictation along with smaller  phrase technology. Any transcriptional errors that result from this process are unintentional.

## 2017-10-29 ENCOUNTER — Inpatient Hospital Stay: Payer: Medicare Other

## 2017-10-29 LAB — BASIC METABOLIC PANEL
Anion gap: 8 (ref 5–15)
BUN: 42 mg/dL — ABNORMAL HIGH (ref 6–20)
CO2: 22 mmol/L (ref 22–32)
Calcium: 8.4 mg/dL — ABNORMAL LOW (ref 8.9–10.3)
Chloride: 106 mmol/L (ref 101–111)
Creatinine, Ser: 1.96 mg/dL — ABNORMAL HIGH (ref 0.61–1.24)
GFR calc Af Amer: 36 mL/min — ABNORMAL LOW (ref >60)
GFR calc non Af Amer: 31 mL/min — ABNORMAL LOW (ref >60)
Glucose, Bld: 97 mg/dL (ref 65–99)
Potassium: 4.3 mmol/L (ref 3.5–5.1)
Sodium: 136 mmol/L (ref 135–145)

## 2017-10-29 LAB — CBC
HCT: 21.4 % — ABNORMAL LOW (ref 40.0–52.0)
Hemoglobin: 7.2 g/dL — ABNORMAL LOW (ref 13.0–18.0)
MCH: 28.5 pg (ref 26.0–34.0)
MCHC: 33.7 g/dL (ref 32.0–36.0)
MCV: 84.4 fL (ref 80.0–100.0)
Platelets: 191 10*3/uL (ref 150–440)
RBC: 2.53 MIL/uL — ABNORMAL LOW (ref 4.40–5.90)
RDW: 15.7 % — ABNORMAL HIGH (ref 11.5–14.5)
WBC: 3.6 10*3/uL — ABNORMAL LOW (ref 3.8–10.6)

## 2017-10-29 LAB — URINALYSIS, COMPLETE (UACMP) WITH MICROSCOPIC
Bacteria, UA: NONE SEEN
Bilirubin Urine: NEGATIVE
Glucose, UA: NEGATIVE mg/dL
Hgb urine dipstick: NEGATIVE
Ketones, ur: NEGATIVE mg/dL
Leukocytes, UA: NEGATIVE
Nitrite: NEGATIVE
Protein, ur: NEGATIVE mg/dL
Specific Gravity, Urine: 1.006 (ref 1.005–1.030)
Squamous Epithelial / LPF: NONE SEEN (ref 0–5)
pH: 5 (ref 5.0–8.0)

## 2017-10-29 LAB — OCCULT BLOOD X 1 CARD TO LAB, STOOL: Fecal Occult Bld: NEGATIVE

## 2017-10-29 MED ORDER — METHYLNALTREXONE BROMIDE 12 MG/0.6ML ~~LOC~~ SOLN
12.0000 mg | Freq: Once | SUBCUTANEOUS | Status: AC
  Administered 2017-10-29: 12 mg via SUBCUTANEOUS
  Filled 2017-10-29: qty 0.6

## 2017-10-29 MED ORDER — POLYETHYLENE GLYCOL 3350 17 G PO PACK
17.0000 g | PACK | Freq: Once | ORAL | Status: AC
  Administered 2017-10-29: 17 g via ORAL
  Filled 2017-10-29: qty 1

## 2017-10-29 NOTE — Progress Notes (Signed)
[facility] [place] NAME: Bryan [name]    MR#:  [id number]  DATE OF BIRTH:  [birth year]  SUBJECTIVE:  CHIEF COMPLAINT:   Chief Complaint  Patient presents with  . Fatigue   Recent spinal surgery, came with generalized weakness and noted to have severe anemia, after transfusion hemoglobin came up in safe range today. Did not have bowel movement for last 1 week. REVIEW OF SYSTEMS:  CONSTITUTIONAL: No fever,have fatigue or weakness.  EYES: No blurred or double vision.  EARS, NOSE, AND THROAT: No tinnitus or ear pain.  RESPIRATORY: No cough, shortness of breath, wheezing or hemoptysis.  CARDIOVASCULAR: No chest pain, orthopnea, edema.  GASTROINTESTINAL: No nausea, vomiting, diarrhea or abdominal pain.  GENITOURINARY: No dysuria, hematuria.  ENDOCRINE: No polyuria, nocturia,  HEMATOLOGY: No anemia, easy bruising or bleeding SKIN: No rash or lesion. MUSCULOSKELETAL: No joint pain or arthritis.   NEUROLOGIC: No tingling, numbness, weakness.  PSYCHIATRY: No anxiety or depression.   ROS  DRUG ALLERGIES:   Allergies  Allergen Reactions  . Penicillins Hives, Swelling and Other (See Comments)    SWELLING REACTION UNSPECIFIED PATIENT HAS TAKEN AMOXICILLIN ON MED HX FROM [facility] PATIENT HAS HAD A PCN REACTION WITH IMMEDIATE RASH, FACIAL/TONGUE/THROAT SWELLING, SOB, OR LIGHTHEADEDNESS WITH HYPOTENSION:  #  #  #  YES  #  #  #   Has patient had a PCN reaction causing severe rash involving mucus membranes or skin necrosis: No Has patient had a PCN reaction that required hospitalization No Has patient had a PCN reaction occurring within the last 10 years: No  . Demerol [Meperidine] Hives and Nausea And Vomiting    VITALS:  Blood pressure 140/60, pulse 96, temperature 98.5 F (36.9 C), temperature source Oral, resp. rate 18, height 5\' 11"  (1.803 m), weight 79.6 kg (175 lb 8 oz), SpO2 95 %.  PHYSICAL EXAMINATION:   GENERAL:  77 y.o.-year-old  patient lying in the bed with no acute distress.  EYES: Pupils equal, round, reactive to light and accommodation. No scleral icterus. Extraocular muscles intact.  HEENT: Head atraumatic, normocephalic. Oropharynx and nasopharynx clear.  NECK:  Supple, no jugular venous distention. No thyroid enlargement, no tenderness.  LUNGS: Normal breath sounds bilaterally, no wheezing, rales,rhonchi or crepitation. No use of accessory muscles of respiration.  CARDIOVASCULAR: S1, S2 normal. No murmurs, rubs, or gallops.  ABDOMEN: Soft, nontender, nondistended. Bowel sounds present. No organomegaly or mass.  EXTREMITIES: No pedal edema, cyanosis, or clubbing. On the mid back he has a recent surgical scar without any active bleeding or swelling around there , were no any secretions. NEUROLOGIC: Cranial nerves II through XII are intact. Muscle strength 4/5 in all extremities. Sensation intact. Gait not checked.  PSYCHIATRIC: The patient is alert and oriented x 3.  SKIN: No obvious rash, lesion, or ulcer.     Physical Exam LABORATORY PANEL:   CBC Recent Labs  Lab 10/29/17 0635  WBC 3.6*  HGB 7.2*  HCT 21.4*  PLT 191   ------------------------------------------------------------------------------------------------------------------  Chemistries  Recent Labs  Lab 10/29/17 0635  NA 136  K 4.3  CL 106  CO2 22  GLUCOSE 97  BUN 42*  CREATININE 1.96*  CALCIUM 8.4*   ------------------------------------------------------------------------------------------------------------------  Cardiac Enzymes No results for input(s): TROPONINI in the last 168 hours. ------------------------------------------------------------------------------------------------------------------  RADIOLOGY:  Dg Thoracic Spine 2 View  Result Date: 10/29/2017 CLINICAL DATA:  Acute onset of upper back pain after spinal surgery in May. EXAM: THORACIC SPINE 2 VIEWS  COMPARISON:  Intraoperative images of the thoracic spine  performed 10/20/2017 FINDINGS: There is no evidence of acute fracture or subluxation along the thoracic spine. Thoracolumbar spinal fusion hardware is partially imaged. The patient is status post vertebroplasty at multiple levels along the lower thoracic spine. Patient is status post median sternotomy. There is a chronic fracture of the inferior-most sternal wire. IMPRESSION: No evidence of acute fracture or subluxation along the thoracic spine. Electronically Signed   By: Garald Balding M.D.   On: 10/29/2017 01:11   Dg Lumbar Spine 2-3 Views  Result Date: 10/29/2017 CLINICAL DATA:  Acute onset of lower back pain.  Initial encounter. EXAM: LUMBAR SPINE - 2-3 VIEW COMPARISON:  Lumbar spine radiographs performed 10/11/2017 FINDINGS: There is no evidence of acute fracture or subluxation. Lumbar spinal fusion hardware is noted. The patient is status post vertebroplasty at L4. There is chronic compression deformity of vertebral body L1. Vertebral bodies demonstrate normal alignment. Intervertebral disc spaces are preserved. The visualized neural foramina are grossly unremarkable in appearance. The visualized bowel gas pattern is unremarkable in appearance; air and stool are noted within the colon. The sacroiliac joints are within normal limits. IMPRESSION: No evidence of acute fracture or subluxation along the lumbar spine. Chronic compression deformity of L1. Underlying hardware appears grossly intact. Electronically Signed   By: Garald Balding M.D.   On: 10/29/2017 01:03    ASSESSMENT AND PLAN:   Active Problems:   Symptomatic anemia   * symptomatic anemia    Patient denies using over-the-counter pain medications or NSAIDs.    Denies any active bleeding.    There is no bleed or swelling or excessive tenderness around his recent surgical site.    His colonoscopy was last year with some polyps removal.    ER physician ordered 1 unit of transfusion- Hb > 7 now,    awaited GI consult.     hold  anticoagulation at this time because of severe anemia.  * recent lumbar and thoracic spinal surgery  x-ray to rule out any acute abnormality is locally- ppears satisfactory.   Continue pain medications for now.  * Ac renal failure   Monitor, Hold htn meds.   negative UA , get renal US.  * Constipation    Continue MiraLAX for now.    Give injection relistor, as Opioid use.  * hypertension   Blood pressure is borderline, we may help to watch carefully.  Stable now.   All the records are reviewed and case discussed with Care Management/Social Workerr. Management plans discussed with the patient, family and they are in agreement.  CODE STATUS: Full.  TOTAL TIME TAKING CARE OF THIS PATIENT: 35 minutes.     POSSIBLE D/C IN 1-2 DAYS, DEPENDING ON CLINICAL CONDITION.   Vaughan Basta M.D on 10/29/2017   Between 7am to 6pm - Pager - [phone]  After 6pm go to [email or web address] - password [email or web address] [place] [facility]  Office  [phone]  CC: Primary care physician; [name], MD  Note: This dictation was prepared with Dragon dictation along with smaller phrase technology. Any transcriptional errors that result from this process are unintentional.

## 2017-10-29 NOTE — Evaluation (Signed)
Physical Therapy Evaluation Patient Details Name: Bryan [name] MRN: [id number] DOB: 1940/12/08 Today's Date: 10/29/2017   History of Present Illness  Pt is a 77 y/o M who presented from SNF with generalized weakness and noted to have severe anemia, s/p transfusion.  S/p Recent thoracic and lumbar spinal surgery ~1 wk ago. Pt's PMH includes schizophrenia, a-fib, poor short term memory.     Clinical Impression  Pt admitted with above diagnosis. Pt currently with functional limitations due to the deficits listed below (see PT Problem List).  Bryan [name] was very pleasant and agreeable to therapy.  He denies feeling tired or lightheaded throughout session.  Pt relies on RW for UE support due to instability and will benefit from SNF at d/c.  Pt lives with his wife who is also at SNF s/p shoulder surgery so he has not support available at d/c.  Pt will benefit from skilled PT to increase their independence and safety with mobility to allow discharge to the venue listed below.      Follow Up Recommendations SNF    Equipment Recommendations  None recommended by PT    Recommendations for Other Services       Precautions / Restrictions Precautions Precautions: Fall;Back Precaution Booklet Issued: No Precaution Comments: Pt aware of back precautions, surgery was ~1 wk ago Required Braces or Orthoses: Spinal Brace Spinal Brace: Lumbar corset;Applied in sitting position(per pt, "on when OOB") Restrictions Weight Bearing Restrictions: No      Mobility  Bed Mobility Overal bed mobility: Modified Independent Bed Mobility: Rolling;Sidelying to Sit Rolling: Modified independent (Device/Increase time) Sidelying to sit: Modified independent (Device/Increase time)       General bed mobility comments: Pt does demonstrate some difficulty and purposefully raises HOB prior to supine>sit which appears to make it more challenging for the pt to roll.  Educated pt in trialing log roll technique  with HOB flat next time, pt verbalized understanding.   Transfers Overall transfer level: Needs assistance Equipment used: Rolling walker (2 wheeled) Transfers: Sit to/from Stand Sit to Stand: Min guard         General transfer comment: Cues to avoid pulling on RW (pt with one hand on bed and one hand on RW).  Pt with well controlled descent to sit.   Ambulation/Gait Ambulation/Gait assistance: Min guard Ambulation Distance (Feet): 120 Feet Assistive device: Rolling walker (2 wheeled) Gait Pattern/deviations: Step-through pattern;Trunk flexed Gait velocity: decreased   General Gait Details: Slightly flexed posture but steady using RW.  Mild fatigue at end of ambulation.   Stairs            Wheelchair Mobility    Modified Rankin (Stroke Patients Only)       Balance Overall balance assessment: Needs assistance Sitting-balance support: No upper extremity supported;Feet supported Sitting balance-Leahy Scale: Good     Standing balance support: No upper extremity supported;During functional activity Standing balance-Leahy Scale: Poor Standing balance comment: Pt relies on RW for support with static and dynamic activities                             Pertinent Vitals/Pain Pain Assessment: 0-10 Pain Score: 4  Pain Location: R side of back(pt has had a pillow under L side laying in bed) Pain Descriptors / Indicators: Sore Pain Intervention(s): Limited activity within patient's tolerance;Monitored during session;Repositioned    Home Living Family/patient expects to be discharged to:: Skilled nursing facility Living Arrangements: Spouse/significant other Available Help  at Discharge: (wife currently at [facility] following shoulder surgery) Type of Home: Other(Comment)(condo) Home Access: Level entry     Home Layout: One level Home Equipment: Grab bars - tub/shower;Shower seat;Bedside commode;Walker - 2 wheels;Cane - single point;Adaptive  equipment Additional Comments: Wife s/p shoulder surgery and is also at same SNF as pt.     Prior Function Level of Independence: Needs assistance   Gait / Transfers Assistance Needed: Pt ambulating in hallway with RW at SNF and receiving PT.    ADL's / Homemaking Assistance Needed: Pt has required assist to tie shoe laces, otherwise pt reports he is independent with dressing, bathing (sitting on shower seat).  Food is provided by SNF.         Hand Dominance   Dominant Hand: Right    Extremity/Trunk Assessment   Upper Extremity Assessment Upper Extremity Assessment: (BUE strength grossly 4/5 )    Lower Extremity Assessment Lower Extremity Assessment: RLE deficits/detail;LLE deficits/detail RLE Deficits / Details: Numbness/tingling Bil toes due to neuropathy per pt.  Strength grossly WNL, R DF 4+/5.  LLE Deficits / Details: Numbness/tingling Bil toes due to neuropathy per pt.  Strength grossly WNL.     Cervical / Trunk Assessment Cervical / Trunk Assessment: Other exceptions Cervical / Trunk Exceptions: s/p back surgery revision  Communication   Communication: No difficulties  Cognition Arousal/Alertness: Awake/alert Behavior During Therapy: WFL for tasks assessed/performed Overall Cognitive Status: Within Functional Limits for tasks assessed                                        General Comments      Exercises General Exercises - Lower Extremity Ankle Circles/Pumps: AROM;Both;10 reps;Supine Other Exercises Other Exercises: Encouraged pt to ambulate at least 3x/day with nursing staff   Assessment/Plan    PT Assessment Patient needs continued PT services  PT Problem List Decreased strength;Decreased activity tolerance;Decreased balance;Decreased mobility;Decreased knowledge of use of DME;Decreased safety awareness;Pain;Decreased knowledge of precautions       PT Treatment Interventions DME instruction;Gait training;Stair training;Functional  mobility training;Therapeutic activities;Therapeutic exercise;Balance training;Neuromuscular re-education;Patient/family education;Modalities    PT Goals (Current goals can be found in the Care Plan section)  Acute Rehab PT Goals Patient Stated Goal: to get more rehab PT Goal Formulation: With patient Time For Goal Achievement: 11/12/17 Potential to Achieve Goals: Good    Frequency Min 2X/week   Barriers to discharge Decreased caregiver support No assist available at d/c    Co-evaluation               AM-PAC PT "6 Clicks" Daily Activity  Outcome Measure Difficulty turning over in bed (including adjusting bedclothes, sheets and blankets)?: A Lot Difficulty moving from lying on back to sitting on the side of the bed? : A Lot Difficulty sitting down on and standing up from a chair with arms (e.g., wheelchair, bedside commode, etc,.)?: A Lot Help needed moving to and from a bed to chair (including a wheelchair)?: A Little Help needed walking in hospital room?: A Little Help needed climbing 3-5 steps with a railing? : A Little 6 Click Score: 15    End of Session Equipment Utilized During Treatment: Gait belt Activity Tolerance: Patient tolerated treatment well;Patient limited by fatigue Patient left: in chair;with call bell/phone within reach;with chair alarm set Nurse Communication: Mobility status PT Visit Diagnosis: Pain;Unsteadiness on feet (R26.81);Other abnormalities of gait and mobility (R26.89) Pain -  Right/Left: (mid) Pain - part of body: (back)    Time: 2929-0903 PT Time Calculation (min) (ACUTE ONLY): 27 min   Charges:   PT Evaluation $PT Eval Low Complexity: 1 Low PT Treatments $Gait Training: 8-22 mins   PT G Codes:         Collie Siad PT, DPT 10/29/2017, 2:14 PM

## 2017-10-29 NOTE — Clinical Social Work Note (Signed)
Clinical Social Work Assessment  Patient Details  Name: Bryan [name] MRN: [id number] Date of Birth: 01-07-1941  Date of referral:  10/29/17               Reason for consult:  Facility Placement                Permission sought to share information with:  Chartered certified accountant granted to share information::  Yes, Verbal Permission Granted  Name::        Agency::  [facility] [place] [facility]  Relationship::     Contact Information:     Housing/Transportation Living arrangements for the past 2 months:  [place], [place] of Information:  Patient, Medical Team, Facility Patient Interpreter Needed:  None Criminal Activity/Legal Involvement Pertinent to Current Situation/Hospitalization:  No - Comment as needed Significant Relationships:  Adult Children, Warehouse manager, Spouse, Siblings Lives with:  Spouse Do you feel safe going back to the place where you live?  Yes Need for family participation in patient care:  No (Coment)  Care giving concerns:  Patient admitted from a SNF   Social Worker assessment / plan:  The CSW visited the patient after seeing in chart review that the patient admitted from [facility]. The patient was pleasant and sitting up in the recliner. The patient confirmed that he is from [facility] and would like to return when he is stable, preferably today. The CSW confirmed with Broadus John at [facility] that the patient can return when medically stable. The patient indicated that his family would transport at that time.  The patient will most likely discharge tomorrow. The CSW will continue to follow to facilitate the discharge.  Employment status:  Retired Forensic scientist:  Medicare PT Recommendations:  [place] / Referral to community resources:     Patient/Family's Response to care:  The patient was gracious and pleasant.  Patient/Family's Understanding of and Emotional Response  to Diagnosis, Current Treatment, and Prognosis:  The patient is in agreement with the discharge plan.  Emotional Assessment Appearance:  Appears younger than stated age Attitude/Demeanor/Rapport:  Engaged, Self-Confident, Charismatic, Gracious Affect (typically observed):  Appropriate, Pleasant Orientation:  Oriented to Self, Oriented to Place, Oriented to  Time, Oriented to Situation Alcohol / Substance use:  Never Used Psych involvement (Current and /or in the community):  No (Comment)  Discharge Needs  Concerns to be addressed:  Care Coordination, Discharge Planning Concerns Readmission within the last 30 days:  Yes Current discharge risk:  None Barriers to Discharge:  No Barriers Identified   Zettie Pho, LCSW 10/29/2017, 3:16 PM

## 2017-10-29 NOTE — Consult Note (Signed)
[facility] GI Inpatient Consult Note   Bryan [name], M.D.  Reason for Consult: Symptomatic anemia   Attending Requesting Consult: Rush Barer, M.D.  Outpatient Primary Physician: [name], M.D.  History of Present Illness: Bryan [name] is a 77 y.o. male with a history of paroxysmal atrial fibrillation, carotid stenosis, GERD, coronary artery disease and osteoarthritis presenting for symptomatic anemia. The patient has had history of presumed occult gastrointestinal bleeding in May 2018 at which time he underwent an EGD and colonoscopy by Dr. Vicente Males revealing monilial esophagitis, mild nonerosive gastritis, and 2 duodenal AVMs. The latter lesions were cauterized.colonoscopy revealed a small polyp that was removed along with colonic diverticulosis. No masses were noted. No sites of bleeding were noted A subsequent video capsule endoscopy of the small bowel was attempted but was inadequate due to the capsule camera being retained in the stomach  over 6 hours.  Past Medical History:  Past Medical History:  Diagnosis Date  . Abnormal CT scan    Asymmetric left rectal wall thickening   . Anemia   . Anxiety   . Arrhythmia   . Chronic lower back pain   . Complication of anesthesia    Memory loss 09/2015  . Coronary artery disease   . Depression   . GERD (gastroesophageal reflux disease)   . Glaucoma   . H/O thoracic aortic aneurysm repair   . Heart murmur   . History of being hospitalized    memory lose kidney funtion down blood pressure up  . History of blood transfusion    "think he had one when he had heart valve OR" (10/14/2016); "none since" (10/19/2017)  . History of chicken pox   . Hyperlipidemia   . Hypertension   . Lumbar stenosis   . Neuropathy   . Osteoarthritis    worse in feet and ankles  . Paroxysmal A-fib ([place])   . Poor short term memory    takes Aricept  . Rheumatoid arthritis ([place])    "all over" (10/14/2016)  . Schizophrenia  ([place])   . Seasonal allergies   . Valvular heart disease     Problem List: Patient Active Problem List   Diagnosis Date Noted  . Fracture lumbar vertebra-closed ([place]) 10/20/2017  . Pseudoarthrosis of lumbar spine 09/28/2017  . Anxiety 06/24/2017  . Lumbar burst fracture, closed, initial encounter ([place]) 06/08/2017  . Conjunctivitis, bacterial 03/22/2017  . Urge incontinence 12/15/2016  . Ataxia 11/09/2016  . Insomnia 11/09/2016  . Transient memory loss 11/09/2016  . Nausea and vomiting 10/15/2016  . Spinal stenosis of lumbar region 10/14/2016  . Leukopenia 09/27/2016  . Thrombocytopenia ([place]) 09/27/2016  . Other pancytopenia ([place]) 09/27/2016  . History of left knee replacement 09/01/2016  . Fall 09/01/2016  . HNP (herniated nucleus pulposus with myelopathy), thoracic 03/08/2016  . Rectal nodule 02/23/2016  . DDD (degenerative disc disease), lumbar 01/02/2016  . Lumbar radiculopathy 01/02/2016  . Facet syndrome, lumbar 01/02/2016  . Greater trochanteric bursitis 11/28/2015  . Acute renal failure ([place]) 11/06/2015  . Dehydration 10/17/2015  . IBS (irritable bowel syndrome) 07/15/2015  . Erectile dysfunction of organic origin 07/15/2015  . Carotid stenosis 05/06/2015  . Difficulty sleeping 04/15/2015  . 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 Chronic kidney disease 02/17/2015  . 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  . Enlarged prostate with lower urinary tract symptoms (LUTS) 02/17/2015  . Rheumatoid arthritis ([place]) 10/01/2014  . Sacroiliac joint disease 10/01/2014  . Fusion of spine, sacral and  sacrococcygeal region 10/01/2014  . DDD (degenerative disc disease), cervical 10/01/2014  . Lumbar and sacral osteoarthritis 10/01/2014  . Mechanical and motor problems with internal organs 06/20/2014  . Iron deficiency anemia 06/19/2014  . Symptomatic anemia 06/19/2014  . Aneurysm, ascending aorta ([place]) 05/27/2014  . Aneurysm of thoracic aorta  ([place]) 05/27/2014  . Thoracic aortic aneurysm without rupture ([place]) 05/27/2014  . Cognitive decline 04/01/2014  . Spinal stenosis of lumbar region with radiculopathy 01/30/2014  . Vitamin D deficiency 01/30/2014  . Dorsalgia, unspecified 01/30/2014  . Lumbar canal stenosis 01/30/2014  . Paroxysmal atrial fibrillation ([place]) 11/27/2013  . S/P AVR (aortic valve replacement) 11/27/2013  . History of ascending aorta repair 11/27/2013  . Hyperlipidemia 11/27/2013  . Essential hypertension 11/27/2013  . Bicuspid aortic valve 11/27/2013  . Atherosclerotic heart disease of native coronary artery without angina pectoris 11/27/2013  . 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 History of open heart surgery 11/27/2013  . Carotid artery insufficiency syndrome 11/06/2010    Past Surgical History: Past Surgical History:  Procedure Laterality Date  . AORTIC VALVE REPLACEMENT  2007   [facility].  [place], [place]; "pig valve"  . APPENDECTOMY  2010  . BACK SURGERY    . CARDIAC VALVE REPLACEMENT    . CATARACT EXTRACTION W/ INTRAOCULAR LENS  IMPLANT, BILATERAL Bilateral 2012  . COLONOSCOPY WITH PROPOFOL N/A 09/24/2016   Procedure: COLONOSCOPY WITH PROPOFOL;  Surgeon: Jonathon Bellows, MD;  Location: [facility] ENDOSCOPY;  Service: Endoscopy;  Laterality: N/A;  . ESOPHAGOGASTRODUODENOSCOPY (EGD) WITH PROPOFOL N/A 09/24/2016   Procedure: ESOPHAGOGASTRODUODENOSCOPY (EGD) WITH PROPOFOL;  Surgeon: Jonathon Bellows, MD;  Location: [facility] END[facility]COPY;  Service: Endoscopy;  Laterality: N/A;  . GIVENS CAPSULE STUDY N/A 09/24/2016   Procedure: GIVENS CAPSULE STUDY;  Surgeon: Jonathon Bellows, MD;  Location: [facility] ENDOSCOPY;  Service: Endoscopy;  Laterality: N/A;  . HAMMER TOE SURGERY Right 10/09/2015   Procedure: HAMMER TOE REPAIR WITH K-WIRE FIXATION RIGHT SECOND TOE;  Surgeon: Albertine Patricia, DPM;  Location: [place];  Service: Podiatry;  Laterality: Right;  WITH LOCAL  . JOINT REPLACEMENT    . LAMINECTOMY WITH  POSTERIOR LATERAL ARTHRODESIS LEVEL 3 N/A 09/28/2017   Procedure: LUMBAR  POSTEROR  FUSION REVISION - LUMBAR ONE -TWO, LUMBAR TWO -THREE,THREE-FOUR ,BILATERAL ARTHRODESIS REMOVAL LUMBAR THREE HARDWARE;  Surgeon: Kary Kos, MD;  Location: [place];  Service: Neurosurgery;  Laterality: N/A;  . LAMINECTOMY WITH POSTERIOR LATERAL ARTHRODESIS LEVEL 3 N/A 10/20/2017   Procedure: Revision fusion and removal of hardware Lumbar one, Pedicle screw fixation thoracic ten-lumbar two, thoracic nine-ten laminotomy, Posterior Lumbar Arthrodesis thoracic ten-lumbar two ;  Surgeon: Kary Kos, MD;  Location: [place];  Service: Neurosurgery;  Laterality: N/A;  . LAPAROSCOPIC CHOLECYSTECTOMY  2010  . LUMBAR FUSION Right 06/08/2017   LUMBAR THREE-FOUR, LUMBAR FOUR-FIVE POSTEROLATERAL ARTHRODESIS WITH RIGHT LUMBAR FOUR-FIVE LAMINECTOMY/FORAMINOTOMY  . LUMBAR LAMINECTOMY/DECOMPRESSION MICRODISCECTOMY Right 03/08/2016   Procedure: Laminectomy and Foraminotomy - Lumbar Five-Sacral One Right;  Surgeon: Kary Kos, MD;  Location: [place];  Service: Neurosurgery;  Laterality: Right;  Right  . MULTIPLE TOOTH EXTRACTIONS     "3"  . POSTERIOR LUMBAR FUSION  10/14/2016   L5-S1  . REPLACEMENT TOTAL KNEE Left 2003  . THORACIC AORTIC ANEURYSM REPAIR  2010    Allergies: Allergies  Allergen Reactions  . Penicillins Hives, Swelling and Other (See Comments)    SWELLING REACTION UNSPECIFIED PATIENT HAS TAKEN AMOXICILLIN ON MED HX FROM [facility] PATIENT HAS HAD A PCN REACTION WITH IMMEDIATE RASH, FACIAL/TONGUE/THROAT SWELLING,  SOB, OR LIGHTHEADEDNESS WITH HYPOTENSION:  #  #  #  YES  #  #  #   Has patient had a PCN reaction causing severe rash involving mucus membranes or skin necrosis: No Has patient had a PCN reaction that required hospitalization No Has patient had a PCN reaction occurring within the last 10 years: No  . Demerol [Meperidine] Hives and Nausea And Vomiting    Home Medications: Medications Prior to Admission  Medication Sig  Dispense Refill Last Dose  . amLODipine (NORVASC) 5 MG tablet TAKE ONE TABLET BY MOUTH EVERY DAY 90 tablet 1 10/19/2017 at Unknown time  . amoxicillin (AMOXIL) 500 MG capsule Take 2,000 mg by mouth See admin instructions. Take 4 Capsules prior to dental appointment   Unknown at Unknown time  . aspirin 325 MG EC tablet Take 325 mg by mouth daily at 2 PM.    10/19/2017 at Unknown time  . atorvastatin (LIPITOR) 40 MG tablet Take 1 tablet (40 mg total) by mouth daily. (Patient taking differently: Take 40 mg by mouth at bedtime. ) 90 tablet 3 10/18/2017  . cetirizine (ZYRTEC) 10 MG tablet Take 10 mg by mouth daily.   10/19/2017 at Unknown time  . cyclobenzaprine (FLEXERIL) 10 MG tablet Take 1 tablet (10 mg total) by mouth 3 (three) times daily as needed for muscle spasms. 30 tablet 0 10/17/2017 at prn  . cyclobenzaprine (FLEXERIL) 5 MG tablet Take 1 tablet (5 mg total) by mouth 3 (three) times daily as needed for muscle spasms. 30 tablet 0   . donepezil (ARICEPT) 5 MG tablet Take 5 mg by mouth at bedtime.    10/18/2017  . fenofibrate 160 MG tablet TAKE ONE TABLET BY MOUTH EVERY DAY (Patient taking differently: TAKE ONE TABLET BY MOUTH EVERY DAY AT BEDTIME) 30 tablet 3 10/18/2017  . FLUoxetine (PROZAC) 20 MG capsule TAKE 1 CAPSULE BY MOUTH EVERY DAY 90 capsule 1 10/19/2017 at Unknown time  . folic acid (FOLVITE) 1 MG tablet Take 1 mg by mouth 2 (two) times daily.   10/19/2017 at Unknown time  . gabapentin (NEURONTIN) 400 MG capsule Take 400 mg by mouth 3 (three) times daily.    10/19/2017 at Unknown time  . HYDROcodone-acetaminophen (NORCO) 10-325 MG tablet Take 1 tablet by mouth every 4 (four) hours as needed for moderate pain. 30 tablet 0 10/19/2017 at prn  . HYDROcodone-acetaminophen (NORCO) 10-325 MG tablet Take 1 tablet by mouth every 8 (eight) hours as needed. (Patient not taking: Reported on 10/20/2017) 30 tablet 0 Not Taking at Unknown time  . HYDROcodone-acetaminophen (NORCO/VICODIN) 5-325 MG tablet Take 2  tablets by mouth every 4 (four) hours as needed for moderate pain. 20 tablet 0   . hydrocortisone cream 1 % Apply 1 application topically daily as needed for itching.   Past Week at prn  . lisinopril (PRINIVIL,ZESTRIL) 20 MG tablet TAKE ONE TABLET BY MOUTH EVERY EVENING 90 tablet 3 10/18/2017  . MYRBETRIQ 50 MG TB24 tablet Take 50 mg by mouth daily.   10/19/2017 at Unknown time  . omeprazole (PRILOSEC) 40 MG capsule TAKE 1 CAPSULE BY MOUTH DAILY (Patient taking differently: TAKE 1 CAPSULE BY MOUTH DAILY WITH SUPPER) 30 capsule 3 10/18/2017  . polyethylene glycol powder (GLYCOLAX/MIRALAX) powder USE 1 CAPFUL IN 4 TO 8 OUNCES OF LIQUID DAILY 527 g 0 Past Week at Unknown time  . tamsulosin (FLOMAX) 0.4 MG CAPS capsule TAKE 1 CAPSULE BY MOUTH DAILY (Patient taking differently: TAKE 1 CAPSULE BY MOUTH DAILY  AT SUPPER) 90 capsule 0 10/18/2017  . testosterone cypionate (DEPOTESTOSTERONE CYPIONATE) 200 MG/ML injection INJECT 9mL INTO THE MUSCLE EVERY 14 DAYS 10 mL 0 Past Month at Unknown time  . traZODone (DESYREL) 50 MG tablet Take 100 mg by mouth at bedtime.    10/18/2017   Home medication reconciliation was completed with the patient.   Scheduled Inpatient Medications:   . aspirin  325 mg Oral Q1400  . atorvastatin  40 mg Oral Daily  . donepezil  5 mg Oral QHS  . fenofibrate  160 mg Oral Daily  . FLUoxetine  20 mg Oral Daily  . folic acid  1 mg Oral BID  . gabapentin  400 mg Oral TID  . loratadine  10 mg Oral Daily  . mirabegron ER  50 mg Oral Daily  . pantoprazole  40 mg Oral Daily  . tamsulosin  0.4 mg Oral Daily  . traZODone  100 mg Oral QHS    Continuous Inpatient Infusions:   . sodium chloride    . sodium chloride 75 mL/hr at 10/29/17 0000    PRN Inpatient Medications:  cyclobenzaprine, docusate sodium, HYDROcodone-acetaminophen, hydrocortisone cream  Family History: family history includes Asthma in his mother; Heart attack (age of onset: 41) in his father; Heart failure in his  mother; Hypertension in his mother and sister.   GI Family History: negative  Social History:   reports that he quit smoking about 36 years ago. His smoking use included cigarettes. He has a 32.00 pack-year smoking history. He has never used smokeless tobacco. He reports that he drinks alcohol. He reports that he does not use drugs. The patient denies ETOH, tobacco, or drug use.    Review of Systems: Review of Systems - Negative except that in the history of present illness.  Physical Examination: BP 133/69 (BP Location: Left Arm)   Pulse 73   Temp 97.7 F (36.5 C) (Oral)   Resp 18   Ht 5\' 11"  (1.803 m)   Wt 79.6 kg (175 lb 8 oz)   SpO2 100%   BMI 24.48 kg/m  Physical Exam  Constitutional: He is oriented to person, place, and time. He appears well-developed. No distress.  HENT:  Head: Normocephalic and atraumatic.  Eyes: Pupils are equal, round, and reactive to light. Right eye exhibits no discharge. Left eye exhibits no discharge. No scleral icterus.  Neck: Normal range of motion. No thyromegaly present.  Cardiovascular: Normal rate and normal heart sounds. Exam reveals no gallop.  No murmur heard. Pulmonary/Chest: Breath sounds normal. No stridor. No respiratory distress. He has no wheezes.  Abdominal: Soft. He exhibits no distension and no mass. There is no tenderness. There is no guarding.  Musculoskeletal: He exhibits no edema, tenderness or deformity.  Neurological: He is alert and oriented to person, place, and time. No cranial nerve deficit.  Skin: Skin is warm and dry.  Psychiatric: He has a normal mood and affect.    Data: Lab Results  Component Value Date   WBC 3.6 (L) 10/29/2017   HGB 7.2 (L) 10/29/2017   HCT 21.4 (L) 10/29/2017   MCV 84.4 10/29/2017   PLT 191 10/29/2017   Recent Labs  Lab 10/28/17 1828 10/29/17 0635  HGB 6.2* 7.2*   Lab Results  Component Value Date   NA 136 10/29/2017   K 4.3 10/29/2017   CL 106 10/29/2017   CO2 22 10/29/2017    BUN 42 (H) 10/29/2017   CREATININE 1.96 (H) 10/29/2017   Lab Results  Component Value Date   ALT 14 (L) 06/27/2017   AST 23 06/27/2017   ALKPHOS 85 06/27/2017   BILITOT 0.5 06/27/2017   No results for input(s): APTT, INR, PTT in the last 168 hours. CBC Latest Ref Rng & Units 10/29/2017 10/28/2017 10/20/2017  WBC 3.8 - 10.6 K/uL 3.6(L) 3.9 3.8(L)  Hemoglobin 13.0 - 18.0 g/dL 7.2(L) 6.2(L) 9.0(L)  Hematocrit 40.0 - 52.0 % 21.4(L) 18.8(L) 28.2(L)  Platelets 150 - 440 K/uL 191 185 263    STUDIES: Dg Thoracic Spine 2 View  Result Date: 10/29/2017 CLINICAL DATA:  Acute onset of upper back pain after spinal surgery in May. EXAM: THORACIC SPINE 2 VIEWS COMPARISON:  Intraoperative images of the thoracic spine performed 10/20/2017 FINDINGS: There is no evidence of acute fracture or subluxation along the thoracic spine. Thoracolumbar spinal fusion hardware is partially imaged. The patient is status post vertebroplasty at multiple levels along the lower thoracic spine. Patient is status post median sternotomy. There is a chronic fracture of the inferior-most sternal wire. IMPRESSION: No evidence of acute fracture or subluxation along the thoracic spine. Electronically Signed   By: Garald Balding M.D.   On: 10/29/2017 01:11   Dg Lumbar Spine 2-3 Views  Result Date: 10/29/2017 CLINICAL DATA:  Acute onset of lower back pain.  Initial encounter. EXAM: LUMBAR SPINE - 2-3 VIEW COMPARISON:  Lumbar spine radiographs performed 10/11/2017 FINDINGS: There is no evidence of acute fracture or subluxation. Lumbar spinal fusion hardware is noted. The patient is status post vertebroplasty at L4. There is chronic compression deformity of vertebral body L1. Vertebral bodies demonstrate normal alignment. Intervertebral disc spaces are preserved. The visualized neural foramina are grossly unremarkable in appearance. The visualized bowel gas pattern is unremarkable in appearance; air and stool are noted within the colon. The  sacroiliac joints are within normal limits. IMPRESSION: No evidence of acute fracture or subluxation along the lumbar spine. Chronic compression deformity of L1. Underlying hardware appears grossly intact. Electronically Signed   By: Garald Balding M.D.   On: 10/29/2017 01:03   @IMAGES @  Assessment: 1. Symptomatic anemia-the Hemoccult of the stool is negative, though I believe the patient probably still has some occult bleeding from small bowel vascular malformations/angiodysplasia.  I believe bleeding will continue at some frequency which can vary and it is unlikely that intervention endoscopically will prevent future bleeding given the nature of the lesions.  Recommendations: 1. Continue blood transfusions to a safe level that well provide adequate oxygen carrying capacity due to the vital organs. 2. I do not advise any endoscopic procedures but only outpatient seal hemoglobin determin with transfusions as necessary. 3.  I will monitor peripherally. I will be available to discuss further if needed  Thank you for the consult. Please call with questions or concerns.  Olean Ree, "Lanny Hurst MD [facility] [place], [place] [address] [phone]  10/29/2017 4:44 PM

## 2017-10-29 NOTE — NC FL2 (Signed)
[place] LEVEL OF CARE SCREENING TOOL     IDENTIFICATION  Patient Name: Bryan [name] Birthdate: 1941-03-06 Sex: male Admission Date (Current Location): 10/28/2017  [place] and [place] Number:  Engineering geologist and Address:  [facility], [address] [place], [place] [address]      Provider Number: [id number]  Attending Physician Name and Address:  Vaughan Basta, *  Relative Name and Phone Number:  Toney Sang ([phone]) Daughter    Current Level of Care: Hospital Recommended Level of Care: [place] Prior Approval Number:    Date Approved/Denied:   PASRR Number: [id number]  Discharge Plan: SNF    Current Diagnoses: Patient Active Problem List   Diagnosis Date Noted  . Fracture lumbar vertebra-closed ([place]) 10/20/2017  . Pseudoarthrosis of lumbar spine 09/28/2017  . Anxiety 06/24/2017  . Lumbar burst fracture, closed, initial encounter ([place]) 06/08/2017  . Conjunctivitis, bacterial 03/22/2017  . Urge incontinence 12/15/2016  . Ataxia 11/09/2016  . Insomnia 11/09/2016  . Transient memory loss 11/09/2016  . Nausea and vomiting 10/15/2016  . Spinal stenosis of lumbar region 10/14/2016  . Leukopenia 09/27/2016  . Thrombocytopenia ([place]) 09/27/2016  . Other pancytopenia ([place]) 09/27/2016  . History of left knee replacement 09/01/2016  . Fall 09/01/2016  . HNP (herniated nucleus pulposus with myelopathy), thoracic 03/08/2016  . Rectal nodule 02/23/2016  . DDD (degenerative disc disease), lumbar 01/02/2016  . Lumbar radiculopathy 01/02/2016  . Facet syndrome, lumbar 01/02/2016  . Greater trochanteric bursitis 11/28/2015  . Acute renal failure ([place]) 11/06/2015  . Dehydration 10/17/2015  . IBS (irritable bowel syndrome) 07/15/2015  . Erectile dysfunction of organic origin 07/15/2015  . Carotid stenosis 05/06/2015  . Difficulty sleeping 04/15/2015  . 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  Chronic kidney disease 02/17/2015  . 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  . Enlarged prostate with lower urinary tract symptoms (LUTS) 02/17/2015  . Rheumatoid arthritis ([place]) 10/01/2014  . Sacroiliac joint disease 10/01/2014  . Fusion of spine, sacral and sacrococcygeal region 10/01/2014  . DDD (degenerative disc disease), cervical 10/01/2014  . Lumbar and sacral osteoarthritis 10/01/2014  . Mechanical and motor problems with internal organs 06/20/2014  . Iron deficiency anemia 06/19/2014  . Symptomatic anemia 06/19/2014  . Aneurysm, ascending aorta ([place]) 05/27/2014  . Aneurysm of thoracic aorta ([place]) 05/27/2014  . Thoracic aortic aneurysm without rupture ([place]) 05/27/2014  . Cognitive decline 04/01/2014  . Spinal stenosis of lumbar region with radiculopathy 01/30/2014  . Vitamin D deficiency 01/30/2014  . Dorsalgia, unspecified 01/30/2014  . Lumbar canal stenosis 01/30/2014  . Paroxysmal atrial fibrillation ([place]) 11/27/2013  . S/P AVR (aortic valve replacement) 11/27/2013  . History of ascending aorta repair 11/27/2013  . Hyperlipidemia 11/27/2013  . Essential hypertension 11/27/2013  . Bicuspid aortic valve 11/27/2013  . Atherosclerotic heart disease of native coronary artery without angina pectoris 11/27/2013  . 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 History of open heart surgery 11/27/2013  . Carotid artery insufficiency syndrome 11/06/2010    Orientation RESPIRATION BLADDER Height & Weight     Self, Time, Situation, Place  Normal Continent Weight: 175 lb 8 oz (79.6 kg) Height:  5\' 11"  (180.3 cm)  BEHAVIORAL SYMPTOMS/MOOD NEUROLOGICAL BOWEL NUTRITION STATUS      Continent    AMBULATORY STATUS COMMUNICATION OF NEEDS Skin   Extensive Assist Verbally Normal  Personal Care Assistance Level of Assistance    Bathing Assistance: Limited assistance Feeding assistance: Independent Dressing  Assistance: Limited assistance     Functional Limitations Info    Sight Info: Adequate Hearing Info: Adequate Speech Info: Adequate    SPECIAL CARE FACTORS FREQUENCY  PT (By licensed PT), OT (By licensed OT)     PT Frequency: Up to 5X per week OT Frequency: Up to 3X per week            Contractures Contractures Info: Not present    Additional Factors Info  Allergies, Code Status Code Status Info: Full Allergies Info:  Penicillins, Demerol Meperidine           Current Medications (10/29/2017):  This is the current hospital active medication list Current Facility-Administered Medications  Medication Dose Route Frequency Provider Last Rate Last Dose  . 0.9 %  sodium chloride infusion  10 mL/hr Intravenous Once Nance Pear, MD      . 0.9 %  sodium chloride infusion   Intravenous Continuous Vaughan Basta, MD 75 mL/hr at 10/29/17 0000    . aspirin EC tablet 325 mg  325 mg Oral Q1400 Vaughan Basta, MD   325 mg at 10/29/17 1350  . atorvastatin (LIPITOR) tablet 40 mg  40 mg Oral Daily Vaughan Basta, MD      . cyclobenzaprine (FLEXERIL) tablet 5 mg  5 mg Oral TID PRN Vaughan Basta, MD   5 mg at 10/29/17 1239  . docusate sodium (COLACE) capsule 100 mg  100 mg Oral BID PRN Vaughan Basta, MD      . donepezil (ARICEPT) tablet 5 mg  5 mg Oral QHS Vaughan Basta, MD      . fenofibrate tablet 160 mg  160 mg Oral Daily Vaughan Basta, MD   160 mg at 10/29/17 0831  . FLUoxetine (PROZAC) capsule 20 mg  20 mg Oral Daily Vaughan Basta, MD   20 mg at 10/29/17 0831  . folic acid (FOLVITE) tablet 1 mg  1 mg Oral BID Vaughan Basta, MD   1 mg at 10/29/17 0831  . gabapentin (NEURONTIN) capsule 400 mg  400 mg Oral TID Vaughan Basta, MD   400 mg at 10/29/17 1555  . HYDROcodone-acetaminophen (NORCO) 10-325 MG per tablet 1 tablet  1 tablet Oral Q4H PRN Vaughan Basta, MD   1 tablet at 10/29/17 1242  .  hydrocortisone cream 1 % 1 application  1 application Topical Daily PRN Vaughan Basta, MD      . loratadine (CLARITIN) tablet 10 mg  10 mg Oral Daily Vaughan Basta, MD   10 mg at 10/29/17 0831  . mirabegron ER (MYRBETRIQ) tablet 50 mg  50 mg Oral Daily Vaughan Basta, MD   50 mg at 10/29/17 0831  . pantoprazole (PROTONIX) EC tablet 40 mg  40 mg Oral Daily Vaughan Basta, MD   40 mg at 10/29/17 0831  . tamsulosin (FLOMAX) capsule 0.4 mg  0.4 mg Oral Daily Vaughan Basta, MD   0.4 mg at 10/29/17 0831  . traZODone (DESYREL) tablet 100 mg  100 mg Oral QHS Vaughan Basta, MD   100 mg at 10/29/17 0050     Discharge Medications: Please see discharge summary for a list of discharge medications.  Relevant Imaging Results:  Relevant Lab Results:   Additional Information SS# [id number]  Zettie Pho, LCSW

## 2017-10-29 NOTE — Plan of Care (Signed)

## 2017-10-30 ENCOUNTER — Inpatient Hospital Stay: Payer: Medicare Other

## 2017-10-30 DIAGNOSIS — R5383 Other fatigue: Secondary | ICD-10-CM

## 2017-10-30 DIAGNOSIS — M069 Rheumatoid arthritis, unspecified: Secondary | ICD-10-CM

## 2017-10-30 DIAGNOSIS — R531 Weakness: Secondary | ICD-10-CM

## 2017-10-30 DIAGNOSIS — N179 Acute kidney failure, unspecified: Secondary | ICD-10-CM

## 2017-10-30 DIAGNOSIS — I4891 Unspecified atrial fibrillation: Secondary | ICD-10-CM

## 2017-10-30 DIAGNOSIS — K219 Gastro-esophageal reflux disease without esophagitis: Secondary | ICD-10-CM

## 2017-10-30 DIAGNOSIS — I2510 Atherosclerotic heart disease of native coronary artery without angina pectoris: Secondary | ICD-10-CM

## 2017-10-30 DIAGNOSIS — D649 Anemia, unspecified: Secondary | ICD-10-CM

## 2017-10-30 DIAGNOSIS — Z880 Allergy status to penicillin: Secondary | ICD-10-CM

## 2017-10-30 DIAGNOSIS — Z888 Allergy status to other drugs, medicaments and biological substances status: Secondary | ICD-10-CM

## 2017-10-30 DIAGNOSIS — Z87891 Personal history of nicotine dependence: Secondary | ICD-10-CM

## 2017-10-30 DIAGNOSIS — I6529 Occlusion and stenosis of unspecified carotid artery: Secondary | ICD-10-CM

## 2017-10-30 LAB — BASIC METABOLIC PANEL
Anion gap: 6 (ref 5–15)
BUN: 25 mg/dL — ABNORMAL HIGH (ref 6–20)
CO2: 23 mmol/L (ref 22–32)
Calcium: 8.4 mg/dL — ABNORMAL LOW (ref 8.9–10.3)
Chloride: 105 mmol/L (ref 101–111)
Creatinine, Ser: 1.37 mg/dL — ABNORMAL HIGH (ref 0.61–1.24)
GFR calc Af Amer: 55 mL/min — ABNORMAL LOW (ref >60)
GFR calc non Af Amer: 47 mL/min — ABNORMAL LOW (ref >60)
Glucose, Bld: 91 mg/dL (ref 65–99)
Potassium: 4.6 mmol/L (ref 3.5–5.1)
Sodium: 134 mmol/L — ABNORMAL LOW (ref 135–145)

## 2017-10-30 LAB — CBC
HCT: 20 % — ABNORMAL LOW (ref 40.0–52.0)
Hemoglobin: 6.5 g/dL — ABNORMAL LOW (ref 13.0–18.0)
MCH: 28.4 pg (ref 26.0–34.0)
MCHC: 32.6 g/dL (ref 32.0–36.0)
MCV: 87.1 fL (ref 80.0–100.0)
Platelets: 186 10*3/uL (ref 150–440)
RBC: 2.3 MIL/uL — ABNORMAL LOW (ref 4.40–5.90)
RDW: 15.8 % — ABNORMAL HIGH (ref 11.5–14.5)
WBC: 3.4 10*3/uL — ABNORMAL LOW (ref 3.8–10.6)

## 2017-10-30 LAB — RETICULOCYTES
RBC.: 2.66 MIL/uL — ABNORMAL LOW (ref 4.40–5.90)
Retic Count, Absolute: 69.2 10*3/uL (ref 19.0–183.0)
Retic Ct Pct: 2.6 % (ref 0.4–3.1)

## 2017-10-30 LAB — FERRITIN: Ferritin: 65 ng/mL (ref 24–336)

## 2017-10-30 LAB — IRON AND TIBC
Iron: 21 ug/dL — ABNORMAL LOW (ref 45–182)
Saturation Ratios: 7 % — ABNORMAL LOW (ref 17.9–39.5)
TIBC: 317 ug/dL (ref 250–450)
UIBC: 296 ug/dL

## 2017-10-30 LAB — LACTATE DEHYDROGENASE: LDH: 205 U/L — ABNORMAL HIGH (ref 98–192)

## 2017-10-30 LAB — PREPARE RBC (CROSSMATCH)

## 2017-10-30 MED ORDER — FERROUS SULFATE 325 (65 FE) MG PO TABS
325.0000 mg | ORAL_TABLET | Freq: Two times a day (BID) | ORAL | Status: DC
  Administered 2017-10-30 – 2017-10-31 (×2): 325 mg via ORAL
  Filled 2017-10-30 (×3): qty 1

## 2017-10-30 MED ORDER — SODIUM CHLORIDE 0.9 % IV SOLN
Freq: Once | INTRAVENOUS | Status: AC
  Administered 2017-10-30: 10:00:00 via INTRAVENOUS

## 2017-10-30 MED ORDER — LISINOPRIL 20 MG PO TABS
20.0000 mg | ORAL_TABLET | Freq: Every day | ORAL | Status: DC
  Administered 2017-10-30 – 2017-10-31 (×2): 20 mg via ORAL
  Filled 2017-10-30 (×2): qty 1

## 2017-10-30 NOTE — Consult Note (Addendum)
Hematology/Oncology Consult note [facility] [phone] [phone]  Patient Care Team: [name], MD as PCP - General (Family Medicine) Minna Merritts, MD as Consulting Physician (Cardiology) Emmaline Kluver., MD (Rheumatology)   Name of the patient: Bryan [name]  [id number]  [birth year]   Date of visit: 10/30/17 REASON FOR COSULTATION:  Anemia. History of presenting illness-  77 y.o. male with PMH listed at below who presents to ER complaining generalized weakness.  Lab work-up in the emergency room showed hemoglobin dropped to 6.2.  Status post PRBC transfusion.  Patient has a history of paroxysmal A. fib, carotid stenosis, GERD, CAD, .  he has history of chronic anemia.  He underwent EGD and colonoscopy which were done by Dr.Anna, which reviewed small angioectasias.  he underwent video capsule study which was not successful due to the fact that capsule camera being retained in the stomach over more than 6 hours.  Other than that no acute bleeding source were identified at that time.  Patient reports that he has a history of rheumatoid arthritis, used to take Plaquenil, stopped about 6 months ago. Denies hematochezia, hemoptysis, hematuria.   Today he reports feeling tired and weak.  Occult stool negative during this admission.  He has being evaluated by GI and recommend continue supportive care.  No urgent intervention.   Review of Systems  Constitutional: Positive for malaise/fatigue. Negative for chills, fever and weight loss.  HENT: Negative for congestion, ear discharge, ear pain, nosebleeds, sinus pain and sore throat.   Eyes: Negative for double vision, photophobia, pain, discharge and redness.  Respiratory: Negative for cough, hemoptysis, sputum production, shortness of breath and wheezing.   Cardiovascular: Negative for chest pain, palpitations and leg swelling.  Gastrointestinal: Negative for abdominal  pain, blood in stool, constipation, diarrhea, heartburn, nausea and vomiting.  Genitourinary: Negative for dysuria, flank pain, frequency and hematuria.  Musculoskeletal: Negative for back pain, myalgias and neck pain.  Neurological: Positive for weakness. Negative for dizziness, tremors, focal weakness and headaches.  Endo/Heme/Allergies: Negative for environmental allergies. Does not bruise/bleed easily.  Psychiatric/Behavioral: Negative for depression and hallucinations. The patient is not nervous/anxious.     Allergies  Allergen Reactions  . Penicillins Hives, Swelling and Other (See Comments)    SWELLING REACTION UNSPECIFIED PATIENT HAS TAKEN AMOXICILLIN ON MED HX FROM [facility] PATIENT HAS HAD A PCN REACTION WITH IMMEDIATE RASH, FACIAL/TONGUE/THROAT SWELLING, SOB, OR LIGHTHEADEDNESS WITH HYPOTENSION:  #  #  #  YES  #  #  #   Has patient had a PCN reaction causing severe rash involving mucus membranes or skin necrosis: No Has patient had a PCN reaction that required hospitalization No Has patient had a PCN reaction occurring within the last 10 years: No  . Demerol [Meperidine] Hives and Nausea And Vomiting    Patient Active Problem List   Diagnosis Date Noted  . Fracture lumbar vertebra-closed ([place]) 10/20/2017  . Pseudoarthrosis of lumbar spine 09/28/2017  . Anxiety 06/24/2017  . Lumbar burst fracture, closed, initial encounter ([place]) 06/08/2017  . Conjunctivitis, bacterial 03/22/2017  . Urge incontinence 12/15/2016  . Ataxia 11/09/2016  . Insomnia 11/09/2016  . Transient memory loss 11/09/2016  . Nausea and vomiting 10/15/2016  . Spinal stenosis of lumbar region 10/14/2016  . Leukopenia 09/27/2016  . Thrombocytopenia ([place]) 09/27/2016  . Other pancytopenia ([place]) 09/27/2016  . History of left knee replacement 09/01/2016  . Fall 09/01/2016  . HNP (herniated nucleus pulposus with myelopathy), thoracic 03/08/2016  .  Rectal nodule 02/23/2016  . DDD (degenerative disc disease),  lumbar 01/02/2016  . Lumbar radiculopathy 01/02/2016  . Facet syndrome, lumbar 01/02/2016  . Greater trochanteric bursitis 11/28/2015  . Acute renal failure ([place]) 11/06/2015  . Dehydration 10/17/2015  . IBS (irritable bowel syndrome) 07/15/2015  . Erectile dysfunction of organic origin 07/15/2015  . Carotid stenosis 05/06/2015  . Difficulty sleeping 04/15/2015  . 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 Chronic kidney disease 02/17/2015  . 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  . Enlarged prostate with lower urinary tract symptoms (LUTS) 02/17/2015  . Rheumatoid arthritis ([place]) 10/01/2014  . Sacroiliac joint disease 10/01/2014  . Fusion of spine, sacral and sacrococcygeal region 10/01/2014  . DDD (degenerative disc disease), cervical 10/01/2014  . Lumbar and sacral osteoarthritis 10/01/2014  . Mechanical and motor problems with internal organs 06/20/2014  . Iron deficiency anemia 06/19/2014  . Symptomatic anemia 06/19/2014  . Aneurysm, ascending aorta ([place]) 05/27/2014  . Aneurysm of thoracic aorta ([place]) 05/27/2014  . Thoracic aortic aneurysm without rupture ([place]) 05/27/2014  . Cognitive decline 04/01/2014  . Spinal stenosis of lumbar region with radiculopathy 01/30/2014  . Vitamin D deficiency 01/30/2014  . Dorsalgia, unspecified 01/30/2014  . Lumbar canal stenosis 01/30/2014  . Paroxysmal atrial fibrillation ([place]) 11/27/2013  . S/P AVR (aortic valve replacement) 11/27/2013  . History of ascending aorta repair 11/27/2013  . Hyperlipidemia 11/27/2013  . Essential hypertension 11/27/2013  . Bicuspid aortic valve 11/27/2013  . Atherosclerotic heart disease of native coronary artery without angina pectoris 11/27/2013  . 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 History of open heart surgery 11/27/2013  . Carotid artery insufficiency syndrome 11/06/2010     Past Medical History:  Diagnosis Date  . Abnormal CT scan     Asymmetric left rectal wall thickening   . Anemia   . Anxiety   . Arrhythmia   . Chronic lower back pain   . Complication of anesthesia    Memory loss 09/2015  . Coronary artery disease   . Depression   . GERD (gastroesophageal reflux disease)   . Glaucoma   . H/O thoracic aortic aneurysm repair   . Heart murmur   . History of being hospitalized    memory lose kidney funtion down blood pressure up  . History of blood transfusion    "think he had one when he had heart valve OR" (10/14/2016); "none since" (10/19/2017)  . History of chicken pox   . Hyperlipidemia   . Hypertension   . Lumbar stenosis   . Neuropathy   . Osteoarthritis    worse in feet and ankles  . Paroxysmal A-fib ([place])   . Poor short term memory    takes Aricept  . Rheumatoid arthritis ([place])    "all over" (10/14/2016)  . Schizophrenia ([place])   . Seasonal allergies   . Valvular heart disease      Past Surgical History:  Procedure Laterality Date  . AORTIC VALVE REPLACEMENT  2007   [facility].  [place], [place]; "pig valve"  . APPENDECTOMY  2010  . BACK SURGERY    . CARDIAC VALVE REPLACEMENT    . CATARACT EXTRACTION W/ INTRAOCULAR LENS  IMPLANT, BILATERAL Bilateral 2012  . COLONOSCOPY WITH PROPOFOL N/A 09/24/2016   Procedure: COLONOSCOPY WITH PROPOFOL;  Surgeon: Jonathon Bellows, MD;  Location: [facility] ENDOSCOPY;  Service: Endoscopy;  Laterality: N/A;  . ESOPHAGOGASTRODUODENOSCOPY (EGD) WITH PROPOFOL N/A 09/24/2016   Procedure: ESOPHAGOGASTRODUODENOSCOPY (EGD) WITH PROPOFOL;  Surgeon: Vicente Males,  Bailey Mech, MD;  Location: [facility] ENDOSCOPY;  Service: Endoscopy;  Laterality: N/A;  . GIVENS CAPSULE STUDY N/A 09/24/2016   Procedure: GIVENS CAPSULE STUDY;  Surgeon: Jonathon Bellows, MD;  Location: [facility] ENDOSCOPY;  Service: Endoscopy;  Laterality: N/A;  . HAMMER TOE SURGERY Right 10/09/2015   Procedure: HAMMER TOE REPAIR WITH K-WIRE FIXATION RIGHT SECOND TOE;  Surgeon: Albertine Patricia, DPM;  Location: [place];  Service: Podiatry;   Laterality: Right;  WITH LOCAL  . JOINT REPLACEMENT    . LAMINECTOMY WITH POSTERIOR LATERAL ARTHRODESIS LEVEL 3 N/A 09/28/2017   Procedure: LUMBAR  POSTEROR  FUSION REVISION - LUMBAR ONE -TWO, LUMBAR TWO -THREE,THREE-FOUR ,BILATERAL ARTHRODESIS REMOVAL LUMBAR THREE HARDWARE;  Surgeon: Kary Kos, MD;  Location: [place];  Service: Neurosurgery;  Laterality: N/A;  . LAMINECTOMY WITH POSTERIOR LATERAL ARTHRODESIS LEVEL 3 N/A 10/20/2017   Procedure: Revision fusion and removal of hardware Lumbar one, Pedicle screw fixation thoracic ten-lumbar two, thoracic nine-ten laminotomy, Posterior Lumbar Arthrodesis thoracic ten-lumbar two ;  Surgeon: Kary Kos, MD;  Location: [place];  Service: Neurosurgery;  Laterality: N/A;  . LAPAROSCOPIC CHOLECYSTECTOMY  2010  . LUMBAR FUSION Right 06/08/2017   LUMBAR THREE-FOUR, LUMBAR FOUR-FIVE POSTEROLATERAL ARTHRODESIS WITH RIGHT LUMBAR FOUR-FIVE LAMINECTOMY/FORAMINOTOMY  . LUMBAR LAMINECTOMY/DECOMPRESSION MICRODISCECTOMY Right 03/08/2016   Procedure: Laminectomy and Foraminotomy - Lumbar Five-Sacral One Right;  Surgeon: Kary Kos, MD;  Location: [place];  Service: Neurosurgery;  Laterality: Right;  Right  . MULTIPLE TOOTH EXTRACTIONS     "3"  . POSTERIOR LUMBAR FUSION  10/14/2016   L5-S1  . REPLACEMENT TOTAL KNEE Left 2003  . THORACIC AORTIC ANEURYSM REPAIR  2010    Social History   Socioeconomic History  . Marital status: Married    Spouse name: Not on file  . Number of children: Not on file  . Years of education: Not on file  . Highest education level: Not on file  Occupational History  . Not on file  Social Needs  . Financial resource strain: Not on file  . Food insecurity:    Worry: Not on file    Inability: Not on file  . Transportation needs:    Medical: Not on file    Non-medical: Not on file  Tobacco Use  . Smoking status: Former Smoker    Packs/day: 1.00    Years: 32.00    Pack years: 32.00    Types: Cigarettes    Last attempt to quit:  07/05/1981    Years since quitting: 36.3  . Smokeless tobacco: Never Used  Substance and Sexual Activity  . Alcohol use: Yes    Comment: 10/19/2017 "glass of wine/month; if that"  . Drug use: Never  . Sexual activity: Not Currently  Lifestyle  . Physical activity:    Days per week: Not on file    Minutes per session: Not on file  . Stress: Not on file  Relationships  . Social connections:    Talks on phone: Not on file    Gets together: Not on file    Attends religious service: Not on file    Active member of club or organization: Not on file    Attends meetings of clubs or organizations: Not on file    Relationship status: Not on file  . Intimate partner violence:    Fear of current or ex partner: Not on file    Emotionally abused: Not on file    Physically abused: Not on file    Forced sexual activity: Not on  file  Other Topics Concern  . Not on file  Social History Narrative  . Not on file     Family History  Problem Relation Age of Onset  . Heart failure Mother   . Hypertension Mother   . Asthma Mother   . Heart attack Father 77       MI  . Hypertension Sister   . Prostate cancer Neg Hx   . Bladder Cancer Neg Hx   . Kidney cancer Neg Hx      Current Facility-Administered Medications:  .  0.9 %  sodium chloride infusion, 10 mL/hr, Intravenous, Once, Nance Pear, MD .  aspirin EC tablet 325 mg, 325 mg, Oral, Q1400, Vaughan Basta, MD, 325 mg at 10/29/17 1350 .  atorvastatin (LIPITOR) tablet 40 mg, 40 mg, Oral, Daily, Vaughan Basta, MD, 40 mg at 10/29/17 1748 .  cyclobenzaprine (FLEXERIL) tablet 5 mg, 5 mg, Oral, TID PRN, Vaughan Basta, MD, 5 mg at 10/29/17 2338 .  docusate sodium (COLACE) capsule 100 mg, 100 mg, Oral, BID PRN, Vaughan Basta, MD .  donepezil (ARICEPT) tablet 5 mg, 5 mg, Oral, QHS, Vaughan Basta, MD, 5 mg at 10/29/17 2153 .  fenofibrate tablet 160 mg, 160 mg, Oral, Daily, Vaughan Basta, MD,  160 mg at 10/30/17 0816 .  ferrous sulfate tablet 325 mg, 325 mg, Oral, BID WC, Vaughan Basta, MD .  FLUoxetine (PROZAC) capsule 20 mg, 20 mg, Oral, Daily, Vaughan Basta, MD, 20 mg at 10/30/17 0816 .  folic acid (FOLVITE) tablet 1 mg, 1 mg, Oral, BID, Vaughan Basta, MD, 1 mg at 10/30/17 0816 .  gabapentin (NEURONTIN) capsule 400 mg, 400 mg, Oral, TID, Vaughan Basta, MD, 400 mg at 10/30/17 0816 .  HYDROcodone-acetaminophen (NORCO) 10-325 MG per tablet 1 tablet, 1 tablet, Oral, Q4H PRN, Vaughan Basta, MD, 1 tablet at 10/30/17 1221 .  hydrocortisone cream 1 % 1 application, 1 application, Topical, Daily PRN, Vaughan Basta, MD .  lisinopril (PRINIVIL,ZESTRIL) tablet 20 mg, 20 mg, Oral, Daily, Vaughan Basta, MD, 20 mg at 10/30/17 1221 .  loratadine (CLARITIN) tablet 10 mg, 10 mg, Oral, Daily, Vaughan Basta, MD, 10 mg at 10/30/17 0816 .  mirabegron ER (MYRBETRIQ) tablet 50 mg, 50 mg, Oral, Daily, Vaughan Basta, MD, 50 mg at 10/30/17 0816 .  pantoprazole (PROTONIX) EC tablet 40 mg, 40 mg, Oral, Daily, Vaughan Basta, MD, 40 mg at 10/30/17 0816 .  tamsulosin (FLOMAX) capsule 0.4 mg, 0.4 mg, Oral, Daily, Vaughan Basta, MD, 0.4 mg at 10/30/17 0816 .  traZODone (DESYREL) tablet 100 mg, 100 mg, Oral, QHS, Vaughan Basta, MD, 100 mg at 10/29/17 2153   Physical exam: ECOG  Vitals:   10/30/17 0833 10/30/17 1000 10/30/17 1023 10/30/17 1252  BP: (!) 157/74 (!) 162/74 (!) 144/66 131/61  Pulse: 89 75 67 82  Resp: 18 18 18 18   Temp: 98.3 F (36.8 C) (!) 96.8 F (36 C) 98.4 F (36.9 C) 98.4 F (36.9 C)  TempSrc:  Axillary Oral Oral  SpO2: 100% 99% 99% 100%  Weight:      Height:       Physical Exam  Constitutional: He is oriented to person, place, and time and well-developed, well-nourished, and in no distress. No distress.  HENT:  Head: Normocephalic and atraumatic.  Nose: Nose normal.    Mouth/Throat: Oropharynx is clear and moist. No oropharyngeal exudate.  Eyes: Pupils are equal, round, and reactive to light. EOM are normal. No scleral icterus.  Pale conjunctivae  Neck: Normal range of  motion. Neck supple. No JVD present.  Cardiovascular: Normal rate and regular rhythm.  Murmur heard. Pulmonary/Chest: Effort normal and breath sounds normal. No respiratory distress. He has no wheezes. He has no rales. He exhibits no tenderness.  Abdominal: Soft. He exhibits no distension and no mass. There is no tenderness. There is no rebound.  Musculoskeletal: Normal range of motion. He exhibits no edema or tenderness.  Lymphadenopathy:    He has no cervical adenopathy.  Neurological: He is alert and oriented to person, place, and time. No cranial nerve deficit. He exhibits normal muscle tone. Coordination normal.  Skin: Skin is warm and dry. No rash noted. No erythema.  Psychiatric: Affect and judgment normal.        CMP Latest Ref Rng & Units 10/30/2017  Glucose 65 - 99 mg/dL 91  BUN 6 - 20 mg/dL 25(H)  Creatinine 0.61 - 1.24 mg/dL 1.37(H)  Sodium 135 - 145 mmol/L 134(L)  Potassium 3.5 - 5.1 mmol/L 4.6  Chloride 101 - 111 mmol/L 105  CO2 22 - 32 mmol/L 23  Calcium 8.9 - 10.3 mg/dL 8.4(L)  Total Protein 6.5 - 8.1 g/dL -  Total Bilirubin 0.3 - 1.2 mg/dL -  Alkaline Phos 38 - 126 U/L -  AST 15 - 41 U/L -  ALT 17 - 63 U/L -   CBC Latest Ref Rng & Units 10/30/2017  WBC 3.8 - 10.6 K/uL 3.4(L)  Hemoglobin 13.0 - 18.0 g/dL 6.5(L)  Hematocrit 40.0 - 52.0 % 20.0(L)  Platelets 150 - 440 K/uL 186   RADIOGRAPHIC STUDIES: I have personally reviewed the radiological images as listed and agreed with the findings in the report. Dg Thoracic Spine 2 View  Result Date: 10/29/2017 CLINICAL DATA:  Acute onset of upper back pain after spinal surgery in May. EXAM: THORACIC SPINE 2 VIEWS COMPARISON:  Intraoperative images of the thoracic spine performed 10/20/2017 FINDINGS: There is no evidence  of acute fracture or subluxation along the thoracic spine. Thoracolumbar spinal fusion hardware is partially imaged. The patient is status post vertebroplasty at multiple levels along the lower thoracic spine. Patient is status post median sternotomy. There is a chronic fracture of the inferior-most sternal wire. IMPRESSION: No evidence of acute fracture or subluxation along the thoracic spine. Electronically Signed   By: Garald Balding M.D.   On: 10/29/2017 01:11   Dg Thoracic Spine 2 View  Result Date: 10/20/2017 CLINICAL DATA:  Portable operative thoracic spine imaging obtained for T9-T10 through T11-T12 fusion. EXAM: THORACIC SPINE 2 VIEWS; DG C-ARM 61-120 MIN COMPARISON:  None. FINDINGS: Two submitted images show pedicle screws in 3 contiguous vertebra where there is vertebroplasty cement. IMPRESSION: Imaging submitted for fusion of the lower thoracic spine. Electronically Signed   By: Lajean Manes M.D.   On: 10/20/2017 20:03   Dg Lumbar Spine 2-3 Views  Result Date: 10/29/2017 CLINICAL DATA:  Acute onset of lower back pain.  Initial encounter. EXAM: LUMBAR SPINE - 2-3 VIEW COMPARISON:  Lumbar spine radiographs performed 10/11/2017 FINDINGS: There is no evidence of acute fracture or subluxation. Lumbar spinal fusion hardware is noted. The patient is status post vertebroplasty at L4. There is chronic compression deformity of vertebral body L1. Vertebral bodies demonstrate normal alignment. Intervertebral disc spaces are preserved. The visualized neural foramina are grossly unremarkable in appearance. The visualized bowel gas pattern is unremarkable in appearance; air and stool are noted within the colon. The sacroiliac joints are within normal limits. IMPRESSION: No evidence of acute fracture or subluxation along the  lumbar spine. Chronic compression deformity of L1. Underlying hardware appears grossly intact. Electronically Signed   By: Garald Balding M.D.   On: 10/29/2017 01:03   Ct Thoracic Spine Wo  Contrast  Result Date: 10/30/2017 CLINICAL DATA:  77 year old male status post multiple prior spine surgeries, most recently revision of spinal hardware on 10/20/2017 for removal of loose L1 pedicle screws and new posterior fusion T10 through L2. Prior L1 burst fracture. EXAM: CT THORACIC SPINE WITHOUT CONTRAST TECHNIQUE: Multidetector CT images of the thoracic were obtained using the standard protocol without intravenous contrast. COMPARISON:  Thoracic spine CT 10/19/2017, and earlier. FINDINGS: Segmentation: Normal.  Same numbering system as on 10/19/2017. Alignment: Stable thoracic vertebral height and alignment since 10/19/2017. Vertebrae: Osteopenia.  No acute osseous abnormality identified. Paraspinal and other soft tissues: Postoperative changes to the posterior paraspinal soft tissues in the lower thoracic spine. Mild soft tissue gas about the T9 and T10 spinous processes. No discrete postoperative fluid collection is evident. New small layering pleural effusions since 10/19/2017. No pericardial effusion. Stable mediastinum, tortuous thoracic aorta. The visible Major airways are patent. There is mild dependent pulmonary atelectasis. Stable and negative visible noncontrast upper abdominal viscera. Disc levels: Unchanged T1-T2 through T8-T9, with capacious thoracic spinal canal at those levels. T9-T10: Right subarticular and bilateral lateral recess epidural space bone cement. Sequelae of right hemilaminectomy at this level. There is bone cement in the ventral right T9 neural foramen. (Series 3, image 99). T10-T11: Bilateral transpedicular hardware with superimposed vertebral body bone cement. Right T10 laminectomy. Ventral epidural space bone cement with involvement of the T10 neural foramina, greater on the right. Pedicle screws appear intact. There is posterior bone graft material. T11-T12: Bilateral pedicle screws with vertebral body bone cement. Mild entrapped is a Shin of bone cement into small lateral  lumbar veins as well as the ventral epidural space at the T12 level. No foraminal involvement. Pedicle screws appear intact. There is posterior bone graft material. The L1 level is described with the lumbar spine CT today separately. IMPRESSION: 1. Postoperative changes at the T10 through T12 levels since 10/19/2017. Bilateral pedicle screws at each level with superimposed bone cement which does extend outside of the vertebral bodies and involve some of the epidural space, as well as the right T9 and right greater than left T10 neural foramina. Right hemilaminectomy on the right at T9 and T10. 2. Osteopenia.  No acute osseous abnormality identified. 3. New small bilateral layering pleural effusions. Electronically Signed   By: Genevie Ann M.D.   On: 10/30/2017 10:12   Ct Thoracic Spine Wo Contrast  Result Date: 10/19/2017 CLINICAL DATA:  77 y/o M; preop scan. T10-L2 revision operation with removal of L1 screws. EXAM: CT THORACIC SPINE WITHOUT CONTRAST TECHNIQUE: Multidetector CT images of the thoracic were obtained using the standard protocol without intravenous contrast. COMPARISON:  07/30/2016 CT of chest. 10/11/2017 lumbar spine radiographs. FINDINGS: Alignment: Normal. Vertebrae: L1 vertebral body superior endplate fracture and compression deformity involving anterior and middle columns with 40% loss of vertebral body height and minimal 2 mm retropulsion of superior endplate. Posterior instrumentation fusion hardware is partially visualized with pedicle screws in the L1 and L2 vertebral bodies. There is morselized bone graft material along the posterior elements at levels of fusion. Bones are demineralized. Paraspinal and other soft tissues: Coronary artery calcification. Aortic valve replacement. Small hiatal hernia. There are few small branching metallic densities within the right lung apex along the bronchovascular bundles, probably cement/foreign body emboli (series 5, image  13). Disc levels: Mild multilevel  discogenic degenerative changes with small marginal osteophytes. No high-grade bony foraminal or canal stenosis. IMPRESSION: 1. Stable 40% L1 compression deformity involving anterior and middle columns. No new acute fracture identified. 2. Partially visualized lumbar fusion hardware from L1 extending below field of view. Electronically Signed   By: Kristine Garbe M.D.   On: 10/19/2017 22:18   Ct Lumbar Spine Wo Contrast  Result Date: 10/30/2017 CLINICAL DATA:  77 year old male status post multiple prior spine surgeries, most recently revision of spinal hardware on 10/20/2017 for removal of loose L1 pedicle screws and new posterior fusion T10 through L2. Prior L1 burst fracture. EXAM: CT LUMBAR SPINE WITHOUT CONTRAST TECHNIQUE: Multidetector CT imaging of the lumbar spine was performed without intravenous contrast administration. Multiplanar CT image reconstructions were also generated. COMPARISON:  Thoracic spine CT today reported separately. Thoracic spine CT 10/19/17. Lumbar spine CT 08/24/2017. FINDINGS: Segmentation: Normal. Alignment: Stable alignment since April. Interval collapse of the L1 vertebral body, further detailed below. Vertebrae: Osteopenia. L1 compression fracture with 45% loss of vertebral body height since April. Superior and inferior endplate deformities. Anterior superior vertebral body cleft containing fluid. Mild retropulsion of the L1 posterosuperior endplate. Sequelae of pedicle screw hardware removal from L1. The remaining lumbar vertebrae are stable, including prior compression fracture and augmentation of L4. visible sacrum and SI joints appear stable and intact. Paraspinal and other soft tissues: Calcified aortic atherosclerosis. Stable and negative visible noncontrast abdominal viscera. Posterior postoperative changes, no discrete fluid collection is evident. Disc levels: T12-L1: The T12 vertebral findings are described on the thoracic spine CT today. Mild L1 retropulsion.  Mild facet hypertrophy. New mild spinal stenosis since April (estimated AP thecal sac now 9 millimeters versus 13 millimeters previously). L1-L2:  Mild disc bulge and endplate spurring are stable. L2-L3: Bilateral L2 pedicle screws are in place with no adverse features. L3 pedicle screws have been removed since 08/24/2017. Stable mild disc bulge. L3-L4: Stable circumferential disc osteophyte complex. Stable mild spinal and bilateral L3 foraminal stenosis. L4-L5: Stable right laminectomy. Stable circumferential disc osteophyte complex and up to moderate left side facet and ligament flavum hypertrophy. Stable mild to moderate bilateral L4 foraminal stenosis. L5-S1: Sequelae of decompression and fusion. Bilateral L5 and S1 pedicle screws with interbody implant appear stable without loosening. Developing interbody and posterior element arthrodesis suspected, but no solid bridging bone yet. IMPRESSION: 1. No acute osseous abnormality identified in the lumbar spine. Moderate L1 compression fracture with 45% loss of vertebral body height and mild retropulsion of bone appears stable since 10/19/2017. 2. Bilateral L2 pedicle screws are in place with no adverse features. L3 pedicle screws have been removed since 08/24/2017. 3. Stable L4-L5 and L5-S1 levels, with sequelae of decompression and fusion at the latter. Developing L5-S1 interbody and posterior element arthrodesis, but not yet solid. 4.  Aortic Atherosclerosis (ICD10-I70.0). Electronically Signed   By: Genevie Ann M.D.   On: 10/30/2017 10:37   US Renal  Result Date: 10/29/2017 CLINICAL DATA:  Acute renal failure EXAM: RENAL / URINARY TRACT ULTRASOUND COMPLETE COMPARISON:  08/09/2016 CT abdomen/pelvis FINDINGS: Right Kidney: Length: 11.9 cm. Mildly echogenic right kidney. Right renal parenchymal thickness is low normal. No right hydronephrosis. Small simple right renal cysts, largest 1.8 cm in the upper right kidney. Left Kidney: Length: 11.7 cm. Mildly echogenic left  kidney. Left renal parenchymal thickness is low normal. No left hydronephrosis. No left renal mass. Bladder: Appears normal for degree of bladder distention. IMPRESSION: 1. No hydronephrosis. 2.  Mildly echogenic normal size kidneys, compatible with the provided history of nonspecific acute renal parenchymal disease. 3. Small simple right renal cysts. 4. Normal bladder. Electronically Signed   By: Ilona Sorrel M.D.   On: 10/29/2017 17:16   Dg C-arm 1-60 Min  Result Date: 10/20/2017 CLINICAL DATA:  Portable operative thoracic spine imaging obtained for T9-T10 through T11-T12 fusion. EXAM: THORACIC SPINE 2 VIEWS; DG C-ARM 61-120 MIN COMPARISON:  None. FINDINGS: Two submitted images show pedicle screws in 3 contiguous vertebra where there is vertebroplasty cement. IMPRESSION: Imaging submitted for fusion of the lower thoracic spine. Electronically Signed   By: Lajean Manes M.D.   On: 10/20/2017 20:03   Dg C-arm 1-60 Min  Result Date: 10/20/2017 CLINICAL DATA:  Portable operative thoracic spine imaging obtained for T9-T10 through T11-T12 fusion. EXAM: THORACIC SPINE 2 VIEWS; DG C-ARM 61-120 MIN COMPARISON:  None. FINDINGS: Two submitted images show pedicle screws in 3 contiguous vertebra where there is vertebroplasty cement. IMPRESSION: Imaging submitted for fusion of the lower thoracic spine. Electronically Signed   By: Lajean Manes M.D.   On: 10/20/2017 20:03    Assessment and plan- Patient is a 77 y.o. male with a history of chronic anemia resented with generalized weakness and fatigue and found to have severe anemia with hemoglobin of 6.2.  #Symptomatic anemia: labs reviewed.   Inappropriately ow reticular count,Iron panel reviewed.  Low TSAT.  I added a ferritin. Check SPEP, TSH, smear, Given his history of chronic anemia with findings of angiectasias, he may have a "GI bleeding which lead to his anemia.  Await ferritin level. Also report that he was supposed to have testosterone supplements but  have not used for about 6 months.  Hypogonadism can also lead to anemia.  Will check testosterone level in the morning.   Normal bilirubin, slightly high LDH,  complete hemolysis work-up with haptoglobin. If above work-up nonconclusive, he needs outpatient bone marrow for further evaluation. Transfuse hemoglobin if <7.   # AKI: US renal Independently reviewed by me and discussed with patient. No hydronephrosis. Kidney function has improved.     Thank you for allowing me to participate in the care of this patient.  Total face to face encounter time for this patient visit was 70 min. >50% of the time was  spent in counseling and coordination of care.    Earlie Server, MD, PhD Hematology Oncology [facility] at [facility] Pager- [phone] 10/30/2017

## 2017-10-30 NOTE — Progress Notes (Signed)
[facility] [place] NAME: Bryan [name]    MR#:  [id number]  DATE OF BIRTH:  05/18/1941  SUBJECTIVE:  CHIEF COMPLAINT:   Chief Complaint  Patient presents with  . Fatigue   Recent spinal surgery, came with generalized weakness and noted to have severe anemia, after transfusion hemoglobin came up in safe range today. Did not have bowel movement for last 1 week.   Now checked stool for guiac is negative. REVIEW OF SYSTEMS:  CONSTITUTIONAL: No fever,have fatigue or weakness.  EYES: No blurred or double vision.  EARS, NOSE, AND THROAT: No tinnitus or ear pain.  RESPIRATORY: No cough, shortness of breath, wheezing or hemoptysis.  CARDIOVASCULAR: No chest pain, orthopnea, edema.  GASTROINTESTINAL: No nausea, vomiting, diarrhea or abdominal pain.  GENITOURINARY: No dysuria, hematuria.  ENDOCRINE: No polyuria, nocturia,  HEMATOLOGY: No anemia, easy bruising or bleeding SKIN: No rash or lesion. MUSCULOSKELETAL: No joint pain or arthritis.   NEUROLOGIC: No tingling, numbness, weakness.  PSYCHIATRY: No anxiety or depression.   ROS  DRUG ALLERGIES:   Allergies  Allergen Reactions  . Penicillins Hives, Swelling and Other (See Comments)    SWELLING REACTION UNSPECIFIED PATIENT HAS TAKEN AMOXICILLIN ON MED HX FROM [facility] PATIENT HAS HAD A PCN REACTION WITH IMMEDIATE RASH, FACIAL/TONGUE/THROAT SWELLING, SOB, OR LIGHTHEADEDNESS WITH HYPOTENSION:  #  #  #  YES  #  #  #   Has patient had a PCN reaction causing severe rash involving mucus membranes or skin necrosis: No Has patient had a PCN reaction that required hospitalization No Has patient had a PCN reaction occurring within the last 10 years: No  . Demerol [Meperidine] Hives and Nausea And Vomiting    VITALS:  Blood pressure (!) 144/66, pulse 67, temperature 98.4 F (36.9 C), temperature source Oral, resp. rate 18, height 5\' 11"  (1.803 m), weight 79.6 kg (175 lb 8 oz), SpO2 99  %.  PHYSICAL EXAMINATION:   GENERAL:  77 y.o.-year-old patient lying in the bed with no acute distress.  EYES: Pupils equal, round, reactive to light and accommodation. No scleral icterus. Extraocular muscles intact.  HEENT: Head atraumatic, normocephalic. Oropharynx and nasopharynx clear.  NECK:  Supple, no jugular venous distention. No thyroid enlargement, no tenderness.  LUNGS: Normal breath sounds bilaterally, no wheezing, rales,rhonchi or crepitation. No use of accessory muscles of respiration.  CARDIOVASCULAR: S1, S2 normal. No murmurs, rubs, or gallops.  ABDOMEN: Soft, nontender, nondistended. Bowel sounds present. No organomegaly or mass.  EXTREMITIES: No pedal edema, cyanosis, or clubbing. On the mid back he has a recent surgical scar without any active bleeding or swelling around there , were no any secretions. NEUROLOGIC: Cranial nerves II through XII are intact. Muscle strength 4/5 in all extremities. Sensation intact. Gait not checked.  PSYCHIATRIC: The patient is alert and oriented x 3.  SKIN: No obvious rash, lesion, or ulcer.     Physical Exam LABORATORY PANEL:   CBC Recent Labs  Lab 10/30/17 0407  WBC 3.4*  HGB 6.5*  HCT 20.0*  PLT 186   ------------------------------------------------------------------------------------------------------------------  Chemistries  Recent Labs  Lab 10/30/17 0407  NA 134*  K 4.6  CL 105  CO2 23  GLUCOSE 91  BUN 25*  CREATININE 1.37*  CALCIUM 8.4*   ------------------------------------------------------------------------------------------------------------------  Cardiac Enzymes No results for input(s): TROPONINI in the last 168 hours. ------------------------------------------------------------------------------------------------------------------  RADIOLOGY:  Dg Thoracic Spine 2 View  Result Date: 10/29/2017 CLINICAL DATA:  Acute onset of upper back pain  after spinal surgery in May. EXAM: THORACIC SPINE 2 VIEWS  COMPARISON:  Intraoperative images of the thoracic spine performed 10/20/2017 FINDINGS: There is no evidence of acute fracture or subluxation along the thoracic spine. Thoracolumbar spinal fusion hardware is partially imaged. The patient is status post vertebroplasty at multiple levels along the lower thoracic spine. Patient is status post median sternotomy. There is a chronic fracture of the inferior-most sternal wire. IMPRESSION: No evidence of acute fracture or subluxation along the thoracic spine. Electronically Signed   By: Garald Balding M.D.   On: 10/29/2017 01:11   Dg Lumbar Spine 2-3 Views  Result Date: 10/29/2017 CLINICAL DATA:  Acute onset of lower back pain.  Initial encounter. EXAM: LUMBAR SPINE - 2-3 VIEW COMPARISON:  Lumbar spine radiographs performed 10/11/2017 FINDINGS: There is no evidence of acute fracture or subluxation. Lumbar spinal fusion hardware is noted. The patient is status post vertebroplasty at L4. There is chronic compression deformity of vertebral body L1. Vertebral bodies demonstrate normal alignment. Intervertebral disc spaces are preserved. The visualized neural foramina are grossly unremarkable in appearance. The visualized bowel gas pattern is unremarkable in appearance; air and stool are noted within the colon. The sacroiliac joints are within normal limits. IMPRESSION: No evidence of acute fracture or subluxation along the lumbar spine. Chronic compression deformity of L1. Underlying hardware appears grossly intact. Electronically Signed   By: Garald Balding M.D.   On: 10/29/2017 01:03   Ct Thoracic Spine Wo Contrast  Result Date: 10/30/2017 CLINICAL DATA:  77 year old male status post multiple prior spine surgeries, most recently revision of spinal hardware on 10/20/2017 for removal of loose L1 pedicle screws and new posterior fusion T10 through L2. Prior L1 burst fracture. EXAM: CT THORACIC SPINE WITHOUT CONTRAST TECHNIQUE: Multidetector CT images of the thoracic were  obtained using the standard protocol without intravenous contrast. COMPARISON:  Thoracic spine CT 10/19/2017, and earlier. FINDINGS: Segmentation: Normal.  Same numbering system as on 10/19/2017. Alignment: Stable thoracic vertebral height and alignment since 10/19/2017. Vertebrae: Osteopenia.  No acute osseous abnormality identified. Paraspinal and other soft tissues: Postoperative changes to the posterior paraspinal soft tissues in the lower thoracic spine. Mild soft tissue gas about the T9 and T10 spinous processes. No discrete postoperative fluid collection is evident. New small layering pleural effusions since 10/19/2017. No pericardial effusion. Stable mediastinum, tortuous thoracic aorta. The visible Major airways are patent. There is mild dependent pulmonary atelectasis. Stable and negative visible noncontrast upper abdominal viscera. Disc levels: Unchanged T1-T2 through T8-T9, with capacious thoracic spinal canal at those levels. T9-T10: Right subarticular and bilateral lateral recess epidural space bone cement. Sequelae of right hemilaminectomy at this level. There is bone cement in the ventral right T9 neural foramen. (Series 3, image 99). T10-T11: Bilateral transpedicular hardware with superimposed vertebral body bone cement. Right T10 laminectomy. Ventral epidural space bone cement with involvement of the T10 neural foramina, greater on the right. Pedicle screws appear intact. There is posterior bone graft material. T11-T12: Bilateral pedicle screws with vertebral body bone cement. Mild entrapped is a Shin of bone cement into small lateral lumbar veins as well as the ventral epidural space at the T12 level. No foraminal involvement. Pedicle screws appear intact. There is posterior bone graft material. The L1 level is described with the lumbar spine CT today separately. IMPRESSION: 1. Postoperative changes at the T10 through T12 levels since 10/19/2017. Bilateral pedicle screws at each level with  superimposed bone cement which does extend outside of the vertebral bodies and involve  some of the epidural space, as well as the right T9 and right greater than left T10 neural foramina. Right hemilaminectomy on the right at T9 and T10. 2. Osteopenia.  No acute osseous abnormality identified. 3. New small bilateral layering pleural effusions. Electronically Signed   By: Genevie Ann M.D.   On: 10/30/2017 10:12   Ct Lumbar Spine Wo Contrast  Result Date: 10/30/2017 CLINICAL DATA:  77 year old male status post multiple prior spine surgeries, most recently revision of spinal hardware on 10/20/2017 for removal of loose L1 pedicle screws and new posterior fusion T10 through L2. Prior L1 burst fracture. EXAM: CT LUMBAR SPINE WITHOUT CONTRAST TECHNIQUE: Multidetector CT imaging of the lumbar spine was performed without intravenous contrast administration. Multiplanar CT image reconstructions were also generated. COMPARISON:  Thoracic spine CT today reported separately. Thoracic spine CT 10/19/17. Lumbar spine CT 08/24/2017. FINDINGS: Segmentation: Normal. Alignment: Stable alignment since April. Interval collapse of the L1 vertebral body, further detailed below. Vertebrae: Osteopenia. L1 compression fracture with 45% loss of vertebral body height since April. Superior and inferior endplate deformities. Anterior superior vertebral body cleft containing fluid. Mild retropulsion of the L1 posterosuperior endplate. Sequelae of pedicle screw hardware removal from L1. The remaining lumbar vertebrae are stable, including prior compression fracture and augmentation of L4. visible sacrum and SI joints appear stable and intact. Paraspinal and other soft tissues: Calcified aortic atherosclerosis. Stable and negative visible noncontrast abdominal viscera. Posterior postoperative changes, no discrete fluid collection is evident. Disc levels: T12-L1: The T12 vertebral findings are described on the thoracic spine CT today. Mild L1  retropulsion. Mild facet hypertrophy. New mild spinal stenosis since April (estimated AP thecal sac now 9 millimeters versus 13 millimeters previously). L1-L2:  Mild disc bulge and endplate spurring are stable. L2-L3: Bilateral L2 pedicle screws are in place with no adverse features. L3 pedicle screws have been removed since 08/24/2017. Stable mild disc bulge. L3-L4: Stable circumferential disc osteophyte complex. Stable mild spinal and bilateral L3 foraminal stenosis. L4-L5: Stable right laminectomy. Stable circumferential disc osteophyte complex and up to moderate left side facet and ligament flavum hypertrophy. Stable mild to moderate bilateral L4 foraminal stenosis. L5-S1: Sequelae of decompression and fusion. Bilateral L5 and S1 pedicle screws with interbody implant appear stable without loosening. Developing interbody and posterior element arthrodesis suspected, but no solid bridging bone yet. IMPRESSION: 1. No acute osseous abnormality identified in the lumbar spine. Moderate L1 compression fracture with 45% loss of vertebral body height and mild retropulsion of bone appears stable since 10/19/2017. 2. Bilateral L2 pedicle screws are in place with no adverse features. L3 pedicle screws have been removed since 08/24/2017. 3. Stable L4-L5 and L5-S1 levels, with sequelae of decompression and fusion at the latter. Developing L5-S1 interbody and posterior element arthrodesis, but not yet solid. 4.  Aortic Atherosclerosis (ICD10-I70.0). Electronically Signed   By: Genevie Ann M.D.   On: 10/30/2017 10:37   US Renal  Result Date: 10/29/2017 CLINICAL DATA:  Acute renal failure EXAM: RENAL / URINARY TRACT ULTRASOUND COMPLETE COMPARISON:  08/09/2016 CT abdomen/pelvis FINDINGS: Right Kidney: Length: 11.9 cm. Mildly echogenic right kidney. Right renal parenchymal thickness is low normal. No right hydronephrosis. Small simple right renal cysts, largest 1.8 cm in the upper right kidney. Left Kidney: Length: 11.7 cm. Mildly  echogenic left kidney. Left renal parenchymal thickness is low normal. No left hydronephrosis. No left renal mass. Bladder: Appears normal for degree of bladder distention. IMPRESSION: 1. No hydronephrosis. 2. Mildly echogenic normal size kidneys, compatible with  the provided history of nonspecific acute renal parenchymal disease. 3. Small simple right renal cysts. 4. Normal bladder. Electronically Signed   By: Ilona Sorrel M.D.   On: 10/29/2017 17:16    ASSESSMENT AND PLAN:   Active Problems:   Symptomatic anemia   * symptomatic anemia    Patient denies using over-the-counter pain medications or NSAIDs.    Denies any active bleeding.    There is no bleed or swelling or excessive tenderness around his recent surgical site.    His colonoscopy was last year with some polyps removal.    ER physician ordered 1 unit of transfusion- Hb > 7 , dropped again < 7- transfuse 2nd unit PRBC now.    Appreciated GI consult. No procedures,.     hold anticoagulation at this time because of severe anemia.    As guiac negative, check iron studies, Retic count and LDH anf get Hematology consult.   As per pt in past he had seen oncology for anemia issues.  * recent lumbar and thoracic spinal surgery  x-ray to rule out any acute abnormality is locally- ppears satisfactory.   Continue pain medications for now.   CT of spine is ordered and done to r/o localized blood loss, negative.  * Ac renal failure   Monitor, Hold htn meds.   negative UA , normal renal US.   Renal func improved.  * Constipation    Continue MiraLAX for now.    Give injection relistor, as Opioid use.  * hypertension   Blood pressure is borderline, we may help to watch carefully.  Stable now. Resume lisinopril.   All the records are reviewed and case discussed with Care Management/Social Workerr. Management plans discussed with the patient, family and they are in agreement.  CODE STATUS: Full.  TOTAL TIME TAKING CARE OF  THIS PATIENT: 35 minutes.     POSSIBLE D/C IN 1-2 DAYS, DEPENDING ON CLINICAL CONDITION.   Vaughan Basta M.D on 10/30/2017   Between 7am to 6pm - Pager - [phone]  After 6pm go to [email or web address] - password [email or web address] [facility]  Office  [phone]  CC: Primary care physician; [name], MD  Note: This dictation was prepared with Dragon dictation along with smaller phrase technology. Any transcriptional errors that result from this process are unintentional.

## 2017-10-30 NOTE — Plan of Care (Signed)

## 2017-10-31 DIAGNOSIS — Z981 Arthrodesis status: Secondary | ICD-10-CM

## 2017-10-31 DIAGNOSIS — S32012D Unstable burst fracture of first lumbar vertebra, subsequent encounter for fracture with routine healing: Secondary | ICD-10-CM | POA: Diagnosis not present

## 2017-10-31 DIAGNOSIS — F0390 Unspecified dementia without behavioral disturbance: Secondary | ICD-10-CM | POA: Diagnosis not present

## 2017-10-31 DIAGNOSIS — E785 Hyperlipidemia, unspecified: Secondary | ICD-10-CM | POA: Diagnosis not present

## 2017-10-31 DIAGNOSIS — F329 Major depressive disorder, single episode, unspecified: Secondary | ICD-10-CM | POA: Diagnosis not present

## 2017-10-31 DIAGNOSIS — E291 Testicular hypofunction: Secondary | ICD-10-CM | POA: Diagnosis not present

## 2017-10-31 DIAGNOSIS — N400 Enlarged prostate without lower urinary tract symptoms: Secondary | ICD-10-CM | POA: Diagnosis not present

## 2017-10-31 DIAGNOSIS — M069 Rheumatoid arthritis, unspecified: Secondary | ICD-10-CM | POA: Diagnosis not present

## 2017-10-31 DIAGNOSIS — D649 Anemia, unspecified: Secondary | ICD-10-CM | POA: Diagnosis not present

## 2017-10-31 DIAGNOSIS — D638 Anemia in other chronic diseases classified elsewhere: Secondary | ICD-10-CM

## 2017-10-31 DIAGNOSIS — D509 Iron deficiency anemia, unspecified: Secondary | ICD-10-CM | POA: Diagnosis not present

## 2017-10-31 DIAGNOSIS — D61818 Other pancytopenia: Secondary | ICD-10-CM | POA: Diagnosis not present

## 2017-10-31 DIAGNOSIS — I10 Essential (primary) hypertension: Secondary | ICD-10-CM | POA: Diagnosis not present

## 2017-10-31 DIAGNOSIS — K5900 Constipation, unspecified: Secondary | ICD-10-CM | POA: Diagnosis not present

## 2017-10-31 DIAGNOSIS — G4700 Insomnia, unspecified: Secondary | ICD-10-CM | POA: Diagnosis not present

## 2017-10-31 DIAGNOSIS — E119 Type 2 diabetes mellitus without complications: Secondary | ICD-10-CM | POA: Diagnosis not present

## 2017-10-31 DIAGNOSIS — Z5189 Encounter for other specified aftercare: Secondary | ICD-10-CM | POA: Diagnosis not present

## 2017-10-31 DIAGNOSIS — F3289 Other specified depressive episodes: Secondary | ICD-10-CM | POA: Diagnosis not present

## 2017-10-31 DIAGNOSIS — Z9889 Other specified postprocedural states: Secondary | ICD-10-CM | POA: Diagnosis not present

## 2017-10-31 DIAGNOSIS — M6281 Muscle weakness (generalized): Secondary | ICD-10-CM | POA: Diagnosis not present

## 2017-10-31 DIAGNOSIS — R700 Elevated erythrocyte sedimentation rate: Secondary | ICD-10-CM | POA: Diagnosis not present

## 2017-10-31 DIAGNOSIS — N179 Acute kidney failure, unspecified: Secondary | ICD-10-CM | POA: Diagnosis not present

## 2017-10-31 DIAGNOSIS — J3089 Other allergic rhinitis: Secondary | ICD-10-CM | POA: Diagnosis not present

## 2017-10-31 DIAGNOSIS — K219 Gastro-esophageal reflux disease without esophagitis: Secondary | ICD-10-CM | POA: Diagnosis not present

## 2017-10-31 LAB — TYPE AND SCREEN
ABO/RH(D): O POS
Antibody Screen: NEGATIVE
Unit division: 0
Unit division: 0

## 2017-10-31 LAB — BASIC METABOLIC PANEL
Anion gap: 6 (ref 5–15)
BUN: 18 mg/dL (ref 6–20)
CO2: 25 mmol/L (ref 22–32)
Calcium: 8.7 mg/dL — ABNORMAL LOW (ref 8.9–10.3)
Chloride: 102 mmol/L (ref 101–111)
Creatinine, Ser: 1.35 mg/dL — ABNORMAL HIGH (ref 0.61–1.24)
GFR calc Af Amer: 56 mL/min — ABNORMAL LOW (ref >60)
GFR calc non Af Amer: 48 mL/min — ABNORMAL LOW (ref >60)
Glucose, Bld: 94 mg/dL (ref 65–99)
Potassium: 4.5 mmol/L (ref 3.5–5.1)
Sodium: 133 mmol/L — ABNORMAL LOW (ref 135–145)

## 2017-10-31 LAB — PATHOLOGIST SMEAR REVIEW

## 2017-10-31 LAB — CBC
HCT: 23.8 % — ABNORMAL LOW (ref 40.0–52.0)
Hemoglobin: 8.1 g/dL — ABNORMAL LOW (ref 13.0–18.0)
MCH: 28.6 pg (ref 26.0–34.0)
MCHC: 33.8 g/dL (ref 32.0–36.0)
MCV: 84.7 fL (ref 80.0–100.0)
Platelets: 222 10*3/uL (ref 150–440)
RBC: 2.82 MIL/uL — ABNORMAL LOW (ref 4.40–5.90)
RDW: 15.4 % — ABNORMAL HIGH (ref 11.5–14.5)
WBC: 3.6 10*3/uL — ABNORMAL LOW (ref 3.8–10.6)

## 2017-10-31 LAB — BPAM RBC
Blood Product Expiration Date: 201906202359
Blood Product Expiration Date: 201906202359
ISSUE DATE / TIME: 201906072056
ISSUE DATE / TIME: 201906091003
Unit Type and Rh: 5100
Unit Type and Rh: 5100

## 2017-10-31 LAB — VITAMIN B12: Vitamin B-12: 401 pg/mL (ref 180–914)

## 2017-10-31 LAB — TSH: TSH: 1.598 u[IU]/mL (ref 0.350–4.500)

## 2017-10-31 MED ORDER — AMLODIPINE BESYLATE 5 MG PO TABS: 5.0000 mg | ORAL_TABLET | Freq: Every day | ORAL | Status: DC

## 2017-10-31 MED ORDER — HYDROCODONE-ACETAMINOPHEN 10-325 MG PO TABS: 1.0000 | ORAL_TABLET | Freq: Four times a day (QID) | ORAL | 0 refills | Status: DC | PRN

## 2017-10-31 MED ORDER — FERROUS SULFATE 325 (65 FE) MG PO TABS: 325.0000 mg | ORAL_TABLET | Freq: Two times a day (BID) | ORAL | 3 refills | Status: DC

## 2017-10-31 NOTE — Progress Notes (Signed)
[facility] Hematology/Oncology Progress Note  Date of admission: 10/28/2017  Hospital day:  10/31/2017  Chief Complaint: Bryan [name] is a 77 y.o. male who was admitted with generalized weakness and a hemoglobin of 6.2.  Subjective: Feeling better s/p transfusion of 2 units of PRBCs.  Ready for discharge.  Social History: The patient is accompanied by his sister, Bryan [name], today.  He is returning to [facility] for rehabilitation s/p back surgery on 10/20/2017.  Allergies:  Allergies  Allergen Reactions  . Penicillins Hives, Swelling and Other (See Comments)    SWELLING REACTION UNSPECIFIED PATIENT HAS TAKEN AMOXICILLIN ON MED HX FROM [facility] PATIENT HAS HAD A PCN REACTION WITH IMMEDIATE RASH, FACIAL/TONGUE/THROAT SWELLING, SOB, OR LIGHTHEADEDNESS WITH HYPOTENSION:  #  #  #  YES  #  #  #   Has patient had a PCN reaction causing severe rash involving mucus membranes or skin necrosis: No Has patient had a PCN reaction that required hospitalization No Has patient had a PCN reaction occurring within the last 10 years: No  . Demerol [Meperidine] Hives and Nausea And Vomiting    Scheduled Medications: . amLODipine  5 mg Oral Daily  . aspirin  325 mg Oral Q1400  . atorvastatin  40 mg Oral Daily  . donepezil  5 mg Oral QHS  . fenofibrate  160 mg Oral Daily  . ferrous sulfate  325 mg Oral BID WC  . FLUoxetine  20 mg Oral Daily  . folic acid  1 mg Oral BID  . gabapentin  400 mg Oral TID  . lisinopril  20 mg Oral Daily  . loratadine  10 mg Oral Daily  . mirabegron ER  50 mg Oral Daily  . pantoprazole  40 mg Oral Daily  . tamsulosin  0.4 mg Oral Daily  . traZODone  100 mg Oral QHS    Review of Systems: GENERAL:  Feels better.  No fevers, sweats or weight loss. PERFORMANCE STATUS (ECOG):  1-2 HEENT:  No visual changes, runny nose, sore throat, mouth sores or tenderness. Lungs: No shortness of breath or cough.  No hemoptysis. Cardiac:  No chest pain,  palpitations, orthopnea, or PND. GI:  No nausea, vomiting, diarrhea, constipation, melena or hematochezia. GU:  No urgency, frequency, dysuria, or hematuria.  Last testosterone 3 weeks ago (typically receives every 2 weeks). Musculoskeletal:  s/p back surgery on 10/20/2017.  Rheumatoid arthritis.  No muscle tenderness. Extremities:  No pain or swelling. Skin:  No rashes or skin changes. Neuro:  No headache, numbness or weakness, balance or coordination issues. Endocrine:  No diabetes, thyroid issues, hot flashes or night sweats. Psych:  No mood changes, depression or anxiety. Pain:  No focal pain. Review of systems:  All other systems reviewed and found to be negative.  Physical Exam: Blood pressure 123/75, pulse 74, temperature 98.6 F (37 C), temperature source Oral, resp. rate 18, height 5' 11"  (1.803 m), weight 175 lb 8 oz (79.6 kg), SpO2 97 %.  GENERAL:  Well developed, well nourished, gentleman lying on his side on the medical unit in no acute distress. MENTAL STATUS:  Alert and oriented to person, place and time. HEAD:  Lily[place] P[place] and goatee.  Normocephalic, atraumatic, face symmetric, no Cushingoid features. EYES:  Hazel eyes.  Pupils equal round and reactive to light and accomodation.  No conjunctivitis or scleral icterus. ENT:  Oropharynx clear without lesion.  Tongue normal. Mucous membranes moist.  RESPIRATORY:  Clear to auscultation without rales, wheezes or rhonchi.  CARDIOVASCULAR:  Regular rate and rhythm without murmur, rub or gallop. ABDOMEN:  Soft, non-tender, with active bowel sounds, and no hepatosplenomegaly.  No masses. BACK:  Wearing a back brace. SKIN:  No rashes, ulcers or lesions. EXTREMITIES: No edema, no skin discoloration or tenderness.  No palpable cords. LYMPH NODES: No palpable cervical, supraclavicular, axillary or inguinal adenopathy  NEUROLOGICAL: Unremarkable. PSYCH:  Appropriate.   Results for orders placed or performed during the hospital encounter  of 10/28/17 (from the past 48 hour(s))  CBC     Status: Abnormal   Collection Time: 10/30/17  4:07 AM  Result Value Ref Range   WBC 3.4 (L) 3.8 - 10.6 K/uL   RBC 2.30 (L) 4.40 - 5.90 MIL/uL   Hemoglobin 6.5 (L) 13.0 - 18.0 g/dL   HCT 20.0 (L) 40.0 - 52.0 %   MCV 87.1 80.0 - 100.0 fL   MCH 28.4 26.0 - 34.0 pg   MCHC 32.6 32.0 - 36.0 g/dL   RDW 15.8 (H) 11.5 - 14.5 %   Platelets 186 150 - 440 K/uL    Comment: Performed at [facility], [address] [place], [place] [address]  Basic metabolic panel     Status: Abnormal   Collection Time: 10/30/17  4:07 AM  Result Value Ref Range   Sodium 134 (L) 135 - 145 mmol/L   Potassium 4.6 3.5 - 5.1 mmol/L   Chloride 105 101 - 111 mmol/L   CO2 23 22 - 32 mmol/L   Glucose, Bld 91 65 - 99 mg/dL   BUN 25 (H) 6 - 20 mg/dL   Creatinine, Ser 1.37 (H) 0.61 - 1.24 mg/dL   Calcium 8.4 (L) 8.9 - 10.3 mg/dL   GFR calc non Af Amer 47 (L) >60 mL/min   GFR calc Af Amer 55 (L) >60 mL/min    Comment: (NOTE) The eGFR has been calculated using the CKD EPI equation. This calculation has not been validated in all clinical situations. eGFR's persistently <60 mL/min signify possible Chronic Kidney Disease.    Anion gap 6 5 - 15    Comment: Performed at [facility], [address] [place], [place] [address]  Iron and TIBC     Status: Abnormal   Collection Time: 10/30/17  4:07 AM  Result Value Ref Range   Iron 21 (L) 45 - 182 ug/dL   TIBC 317 250 - 450 ug/dL   Saturation Ratios 7 (L) 17.9 - 39.5 %   UIBC 296 ug/dL    Comment: Performed at [facility], [address] [place], [place] [address]  Prepare RBC     Status: None   Collection Time: 10/30/17  7:39 AM  Result Value Ref Range   Order Confirmation      ORDER PROCESSED BY BLOOD BANK Performed at [facility], [address], [place], [place] [address]   Ferritin     Status: None   Collection Time: 10/30/17  7:41 AM  Result Value Ref Range   Ferritin 65  24 - 336 ng/mL    Comment: Performed at [facility], [address] [place], [place] [address]  Reticulocytes     Status: Abnormal   Collection Time: 10/30/17  8:20 AM  Result Value Ref Range   Retic Ct Pct 2.6 0.4 - 3.1 %   RBC. 2.66 (L) 4.40 - 5.90 MIL/uL   Retic Count, Absolute 69.2 19.0 - 183.0 K/uL    Comment: Performed at [facility], [address] [place], [place] [address]  Lactate dehydrogenase     Status: Abnormal   Collection Time: 10/30/17  8:24 AM  Result Value Ref Range   LDH 205 (H) 98 - 192 U/L    Comment: Performed at [facility], [address] [place], [place] [address]  CBC     Status: Abnormal   Collection Time: 10/31/17  3:38 AM  Result Value Ref Range   WBC 3.6 (L) 3.8 - 10.6 K/uL   RBC 2.82 (L) 4.40 - 5.90 MIL/uL   Hemoglobin 8.1 (L) 13.0 - 18.0 g/dL   HCT 23.8 (L) 40.0 - 52.0 %   MCV 84.7 80.0 - 100.0 fL   MCH 28.6 26.0 - 34.0 pg   MCHC 33.8 32.0 - 36.0 g/dL   RDW 15.4 (H) 11.5 - 14.5 %   Platelets 222 150 - 440 K/uL    Comment: Performed at [facility], [address] [place], [place] [address]  Basic metabolic panel     Status: Abnormal   Collection Time: 10/31/17  3:38 AM  Result Value Ref Range   Sodium 133 (L) 135 - 145 mmol/L   Potassium 4.5 3.5 - 5.1 mmol/L   Chloride 102 101 - 111 mmol/L   CO2 25 22 - 32 mmol/L   Glucose, Bld 94 65 - 99 mg/dL   BUN 18 6 - 20 mg/dL   Creatinine, Ser 1.35 (H) 0.61 - 1.24 mg/dL   Calcium 8.7 (L) 8.9 - 10.3 mg/dL   GFR calc non Af Amer 48 (L) >60 mL/min   GFR calc Af Amer 56 (L) >60 mL/min    Comment: (NOTE) The eGFR has been calculated using the CKD EPI equation. This calculation has not been validated in all clinical situations. eGFR's persistently <60 mL/min signify possible Chronic Kidney Disease.    Anion gap 6 5 - 15    Comment: Performed at [facility], [address] [place], [place] [address]  Pathologist smear review     Status: None    Collection Time: 10/31/17  3:38 AM  Result Value Ref Range   Path Review      Smear review shows leukopenia with normal WBC morphology. Normocytic anemia with polychromasia, anisocytosis and rouleaux formation. Platelet morphology is normal. Patient is followed by Hematology oncology. Dr. Luana Shu.    Comment: Performed at [facility], [address] [place], [place] [address]  TSH     Status: None   Collection Time: 10/31/17  3:38 AM  Result Value Ref Range   TSH 1.598 0.350 - 4.500 uIU/mL    Comment: Performed by a 3rd Generation assay with a functional sensitivity of <=0.01 uIU/mL. Performed at [facility], [address] [place], [place] [address]   Vitamin B12     Status: None   Collection Time: 10/31/17  3:38 AM  Result Value Ref Range   Vitamin B-12 401 180 - 914 pg/mL    Comment: (NOTE) This assay is not validated for testing neonatal or myeloproliferative syndrome specimens for Vitamin B12 levels. Performed at [facility], [place] [address] [place], [place] [address]    Ct Thoracic Spine Wo Contrast  Result Date: 10/30/2017 CLINICAL DATA:  77 year old male status post multiple prior spine surgeries, most recently revision of spinal hardware on 10/20/2017 for removal of loose L1 pedicle screws and new posterior fusion T10 through L2. Prior L1 burst fracture. EXAM: CT THORACIC SPINE WITHOUT CONTRAST TECHNIQUE: Multidetector CT images of the  thoracic were obtained using the standard protocol without intravenous contrast. COMPARISON:  Thoracic spine CT 10/19/2017, and earlier. FINDINGS: Segmentation: Normal.  Same numbering system as on 10/19/2017. Alignment: Stable thoracic vertebral height and alignment since 10/19/2017. Vertebrae: Osteopenia.  No acute osseous abnormality identified. Paraspinal and other soft tissues: Postoperative changes to the posterior paraspinal soft tissues in the lower thoracic spine. Mild soft tissue gas about the T9 and T10 spinous  processes. No discrete postoperative fluid collection is evident. New small layering pleural effusions since 10/19/2017. No pericardial effusion. Stable mediastinum, tortuous thoracic aorta. The visible Major airways are patent. There is mild dependent pulmonary atelectasis. Stable and negative visible noncontrast upper abdominal viscera. Disc levels: Unchanged T1-T2 through T8-T9, with capacious thoracic spinal canal at those levels. T9-T10: Right subarticular and bilateral lateral recess epidural space bone cement. Sequelae of right hemilaminectomy at this level. There is bone cement in the ventral right T9 neural foramen. (Series 3, image 99). T10-T11: Bilateral transpedicular hardware with superimposed vertebral body bone cement. Right T10 laminectomy. Ventral epidural space bone cement with involvement of the T10 neural foramina, greater on the right. Pedicle screws appear intact. There is posterior bone graft material. T11-T12: Bilateral pedicle screws with vertebral body bone cement. Mild entrapped is a Shin of bone cement into small lateral lumbar veins as well as the ventral epidural space at the T12 level. No foraminal involvement. Pedicle screws appear intact. There is posterior bone graft material. The L1 level is described with the lumbar spine CT today separately. IMPRESSION: 1. Postoperative changes at the T10 through T12 levels since 10/19/2017. Bilateral pedicle screws at each level with superimposed bone cement which does extend outside of the vertebral bodies and involve some of the epidural space, as well as the right T9 and right greater than left T10 neural foramina. Right hemilaminectomy on the right at T9 and T10. 2. Osteopenia.  No acute osseous abnormality identified. 3. New small bilateral layering pleural effusions. Electronically Signed   By: Genevie Ann M.D.   On: 10/30/2017 10:12   Ct Lumbar Spine Wo Contrast  Result Date: 10/30/2017 CLINICAL DATA:  77 year old male status post multiple  prior spine surgeries, most recently revision of spinal hardware on 10/20/2017 for removal of loose L1 pedicle screws and new posterior fusion T10 through L2. Prior L1 burst fracture. EXAM: CT LUMBAR SPINE WITHOUT CONTRAST TECHNIQUE: Multidetector CT imaging of the lumbar spine was performed without intravenous contrast administration. Multiplanar CT image reconstructions were also generated. COMPARISON:  Thoracic spine CT today reported separately. Thoracic spine CT 10/19/17. Lumbar spine CT 08/24/2017. FINDINGS: Segmentation: Normal. Alignment: Stable alignment since April. Interval collapse of the L1 vertebral body, further detailed below. Vertebrae: Osteopenia. L1 compression fracture with 45% loss of vertebral body height since April. Superior and inferior endplate deformities. Anterior superior vertebral body cleft containing fluid. Mild retropulsion of the L1 posterosuperior endplate. Sequelae of pedicle screw hardware removal from L1. The remaining lumbar vertebrae are stable, including prior compression fracture and augmentation of L4. visible sacrum and SI joints appear stable and intact. Paraspinal and other soft tissues: Calcified aortic atherosclerosis. Stable and negative visible noncontrast abdominal viscera. Posterior postoperative changes, no discrete fluid collection is evident. Disc levels: T12-L1: The T12 vertebral findings are described on the thoracic spine CT today. Mild L1 retropulsion. Mild facet hypertrophy. New mild spinal stenosis since April (estimated AP thecal sac now 9 millimeters versus 13 millimeters previously). L1-L2:  Mild disc bulge and endplate spurring are stable. L2-L3: Bilateral L2 pedicle  screws are in place with no adverse features. L3 pedicle screws have been removed since 08/24/2017. Stable mild disc bulge. L3-L4: Stable circumferential disc osteophyte complex. Stable mild spinal and bilateral L3 foraminal stenosis. L4-L5: Stable right laminectomy. Stable circumferential  disc osteophyte complex and up to moderate left side facet and ligament flavum hypertrophy. Stable mild to moderate bilateral L4 foraminal stenosis. L5-S1: Sequelae of decompression and fusion. Bilateral L5 and S1 pedicle screws with interbody implant appear stable without loosening. Developing interbody and posterior element arthrodesis suspected, but no solid bridging bone yet. IMPRESSION: 1. No acute osseous abnormality identified in the lumbar spine. Moderate L1 compression fracture with 45% loss of vertebral body height and mild retropulsion of bone appears stable since 10/19/2017. 2. Bilateral L2 pedicle screws are in place with no adverse features. L3 pedicle screws have been removed since 08/24/2017. 3. Stable L4-L5 and L5-S1 levels, with sequelae of decompression and fusion at the latter. Developing L5-S1 interbody and posterior element arthrodesis, but not yet solid. 4.  Aortic Atherosclerosis (ICD10-I70.0). Electronically Signed   By: Genevie Ann M.D.   On: 10/30/2017 10:37   US Renal  Result Date: 10/29/2017 CLINICAL DATA:  Acute renal failure EXAM: RENAL / URINARY TRACT ULTRASOUND COMPLETE COMPARISON:  08/09/2016 CT abdomen/pelvis FINDINGS: Right Kidney: Length: 11.9 cm. Mildly echogenic right kidney. Right renal parenchymal thickness is low normal. No right hydronephrosis. Small simple right renal cysts, largest 1.8 cm in the upper right kidney. Left Kidney: Length: 11.7 cm. Mildly echogenic left kidney. Left renal parenchymal thickness is low normal. No left hydronephrosis. No left renal mass. Bladder: Appears normal for degree of bladder distention. IMPRESSION: 1. No hydronephrosis. 2. Mildly echogenic normal size kidneys, compatible with the provided history of nonspecific acute renal parenchymal disease. 3. Small simple right renal cysts. 4. Normal bladder. Electronically Signed   By: Ilona Sorrel M.D.   On: 10/29/2017 17:16    Assessment:  Bryan [name] is a 77 y.o. male with with  rheumatoid arthritis and progressive anemia over the past 3 years.  He has been on Plaquenil and hydralazine which can cause anemia.  Plaquenil was discontinued on 08/26/2016.  He denies any prior history of hepatitis, prior transfusions or HIV risk factors.  He denies any herbal products.  Diet is good.  EGD on 09/24/2016 revealed patchy candidiasis in the entire esophagus.  There were two non-bleeding angioectasias in the duodenum treated with argon plasma coagulation.  Gastritis was biopsied.  Pathology revealed mild chronic gastritis negative for H pylori, dysplasia and malignancy.  Colonoscopy on 09/24/2016 revealed diverticulosis in the entire colon, one 3 mm polyp in the cecum, and non-bleeding internal hemorrhoids.  Pathology from the cecal polyp revealed a tubular adenoma negative for high grade dysplasia and malignancy.    Work-up on 07/19/2016 revealed a hematocrit of 31.7, hemoglobin 10.6, MCV 91, platelets 143,000, white count 3000 with an ANC of 1800.  Absolute lymphocyte count was 700 (low). Creatinine was 1.57.  LDH was 269 (98-192).  Normal labs included:  uric acid, folate, Coombs, SPEP, and copper.  Iron studies included a saturation of 11% (low) and a TIBC of 454 (high) c/w iron deficiency anemia.  Sed rate was 19.  Retic was 0.9% (low).    Bone marrow aspirate and biopsy on 11/04/2016 revealed a normocellular marrow (20%) with trilineage hematopoiesis and maturation.  There was mild megakaryocytic atypia.  Storage iron was present.  There were rare ringed sideroblasts.  Correlation with cytogenetics is recommended to exclude a  low grade myelodysplastic syndrome with atypia. Cytogenetics were normal (46, XY).  He was admitted to [facility] on 10/14/2016 - 10/18/2016 for spinal stenosis of the lumbar region.  He underwent redo decompressive lumbar laminotomy L5-S1 with radical foraminotomy the L5 and S1 nerve root with complete medial facetectomies.  He underwent back surgery (revision  fusion removal hardware and pedicle screw fixation) on 10/20/2017.  Work-up with this admission revealed the following normal labs: ferritin (65), B12 (401), TSH.  Iron saturation was 7% with a TIBC of 317.  LDH was 205 (98-192).  Retic was 2.6%.  Creatinine was 1.37 (CrCl 47.3 ml/min).  Pending labs:  SPEP, haptogloblin, testosterone.   Peripheral smear revealed leukopenia with normal WBC morphology. Normocytic anemia with polychromasia, anisocytosis and rouleaux formation.   Symptomatically, he feels better.  He denies any melena or hematochezia. He denies any hematuria.  Exam is unremarkable.  Hemoglobin has improved from 6.2 to 8.1.  Plan: 1. Hematology:  Patient s/p surgery on 10/20/2017.  Hemoglobin on 10/20/2017 was 9.0.  Estimated blood loss minimal.  Hemoglobin improved s/p 2 units of PRBCs.  Suspect anemia is multi-factorial.  Some component of iron deficiency with a low iron saturation.  Ferritin may be falsely elevated (correlate with sed rate).  Some component of anemia of chronic disease (rheumatoid arthritis).  Follow-up pending labs.  2. Disposition:  Patient discharged today.  Follow-up in outpatient clinic in 1 week.   Lequita Asal, MD  10/31/2017, 1:38 PM

## 2017-10-31 NOTE — Discharge Instructions (Signed)
Follow in oncology clinic in 1 week.

## 2017-10-31 NOTE — Care Management Important Message (Signed)
Important Message  Patient Details  Name: Bryan [name] MRN: [id number] Date of Birth: 1941/01/05   Medicare Important Message Given:  Yes    [name] 10/31/2017, 10:31 AM

## 2017-10-31 NOTE — Clinical Social Work Note (Signed)
Patient is medically ready for discharge today. Patient will discharge back to [facility]. CSW notified patient and sister in the room. Patient is in agreement and sister will transport. Rn to call report.  [place], [place]

## 2017-10-31 NOTE — Discharge Summary (Signed)
[facility] NAME: Bryan [name]    MR#:  [id number]  DATE OF BIRTH:  03/04/1941  DATE OF ADMISSION:  10/28/2017 ADMITTING PHYSICIAN: Vaughan Basta, MD  DATE OF DISCHARGE: 10/31/2017   PRIMARY CARE PHYSICIAN: [name], MD    ADMISSION DIAGNOSIS:  Pain [R52] AKI (acute kidney injury) ([place]) [N17.9] Other fatigue [R53.83] Anemia, unspecified type [D64.9]  DISCHARGE DIAGNOSIS:  Active Problems:   Symptomatic anemia   Other fatigue   Anemia   AKI (acute kidney injury) ([place])   SECONDARY DIAGNOSIS:   Past Medical History:  Diagnosis Date  . Abnormal CT scan    Asymmetric left rectal wall thickening   . Anemia   . Anxiety   . Arrhythmia   . Chronic lower back pain   . Complication of anesthesia    Memory loss 09/2015  . Coronary artery disease   . Depression   . GERD (gastroesophageal reflux disease)   . Glaucoma   . H/O thoracic aortic aneurysm repair   . Heart murmur   . History of being hospitalized    memory lose kidney funtion down blood pressure up  . History of blood transfusion    "think he had one when he had heart valve OR" (10/14/2016); "none since" (10/19/2017)  . History of chicken pox   . Hyperlipidemia   . Hypertension   . Lumbar stenosis   . Neuropathy   . Osteoarthritis    worse in feet and ankles  . Paroxysmal A-fib ([place])   . Poor short term memory    takes Aricept  . Rheumatoid arthritis ([place])    "all over" (10/14/2016)  . Schizophrenia ([place])   . Seasonal allergies   . Valvular heart disease     HOSPITAL COURSE:   * symptomatic anemia Patient denies using over-the-counter pain medications or NSAIDs. Denies any active bleeding. There is no bleed or swelling or excessive tenderness around his recent surgical site. His colonoscopy was last year with some polyps removal. ER physician ordered 1 unit of transfusion- Hb > 7 , dropped again < 7-  transfuse 2nd unit PRBC now. Hb > 8.    Appreciated GI consult. No procedures,.  hold anticoagulation at this time because of severe anemia.    As guiac negative, check iron studies, Retic count and LDH anf get Hematology consult.   As per pt in past he had seen oncology for anemia issues.   Seen by oncologist, suggest to follow as out pt for possible BM biopsy.  * recent lumbar and thoracic spinal surgery  x-ray to rule out any acute abnormality is locally- ppears satisfactory. Continue pain medications for now.   CT of spine is ordered and done to r/o localized blood loss, negative.  * Ac renal failure Monitor, Hold htn meds. negative UA , normal renal US.   Renal func improved.  * Constipation Continue MiraLAX for now.    Give injection relistor, as Opioid use.  * hypertension Blood pressure is borderline, we may help to watch carefully.  Stable now. Resume lisinopril and amlodipine,.    DISCHARGE CONDITIONS:   Stable.  CONSULTS OBTAINED:  Treatment Team:  Earlie Server, MD  DRUG ALLERGIES:   Allergies  Allergen Reactions  . Penicillins Hives, Swelling and Other (See Comments)    SWELLING REACTION UNSPECIFIED PATIENT HAS TAKEN AMOXICILLIN ON MED HX FROM [facility] PATIENT HAS HAD A PCN REACTION WITH IMMEDIATE RASH, FACIAL/TONGUE/THROAT SWELLING, SOB, OR LIGHTHEADEDNESS  WITH HYPOTENSION:  #  #  #  YES  #  #  #   Has patient had a PCN reaction causing severe rash involving mucus membranes or skin necrosis: No Has patient had a PCN reaction that required hospitalization No Has patient had a PCN reaction occurring within the last 10 years: No  . Demerol [Meperidine] Hives and Nausea And Vomiting    DISCHARGE MEDICATIONS:   Allergies as of 10/31/2017      Reactions   Penicillins Hives, Swelling, Other (See Comments)   SWELLING REACTION UNSPECIFIED PATIENT HAS TAKEN AMOXICILLIN ON MED HX FROM [facility] PATIENT HAS HAD A PCN REACTION WITH IMMEDIATE RASH,  FACIAL/TONGUE/THROAT SWELLING, SOB, OR LIGHTHEADEDNESS WITH HYPOTENSION:  #  #  #  YES  #  #  #   Has patient had a PCN reaction causing severe rash involving mucus membranes or skin necrosis: No Has patient had a PCN reaction that required hospitalization No Has patient had a PCN reaction occurring within the last 10 years: No   Demerol [meperidine] Hives, Nausea And Vomiting      Medication List    STOP taking these medications   oxyCODONE 5 MG immediate release tablet Commonly known as:  Oxy IR/ROXICODONE     TAKE these medications   acetaminophen 325 MG tablet Commonly known as:  TYLENOL Take 650 mg by mouth 4 (four) times daily.   amLODipine 5 MG tablet Commonly known as:  NORVASC TAKE ONE TABLET BY MOUTH EVERY DAY   amoxicillin 500 MG capsule Commonly known as:  AMOXIL Take 2,000 mg by mouth once. 1 hour prior to dental appointment   aspirin 325 MG EC tablet Take 325 mg by mouth daily at 2 PM.   atorvastatin 40 MG tablet Commonly known as:  LIPITOR Take 1 tablet (40 mg total) by mouth daily. What changed:  when to take this   cetirizine 10 MG tablet Commonly known as:  ZYRTEC Take 10 mg by mouth daily.   cyclobenzaprine 5 MG tablet Commonly known as:  FLEXERIL Take 1 tablet (5 mg total) by mouth 3 (three) times daily as needed for muscle spasms. What changed:    how much to take  Another medication with the same name was removed. Continue taking this medication, and follow the directions you see here.   donepezil 5 MG tablet Commonly known as:  ARICEPT Take 5 mg by mouth at bedtime.   fenofibrate 160 MG tablet TAKE ONE TABLET BY MOUTH EVERY DAY What changed:    how much to take  how to take this  when to take this   ferrous sulfate 325 (65 FE) MG tablet Take 1 tablet (325 mg total) by mouth 2 (two) times daily with a meal.   FLUoxetine 20 MG capsule Commonly known as:  PROZAC TAKE 1 CAPSULE BY MOUTH EVERY DAY   folic acid 1 MG  tablet Commonly known as:  FOLVITE Take 1 mg by mouth 2 (two) times daily.   gabapentin 400 MG capsule Commonly known as:  NEURONTIN Take 400 mg by mouth 3 (three) times daily.   HYDROcodone-acetaminophen 10-325 MG tablet Commonly known as:  NORCO Take 1 tablet by mouth every 6 (six) hours as needed for moderate pain. What changed:    when to take this  Another medication with the same name was removed. Continue taking this medication, and follow the directions you see here.   hydrocortisone cream 1 % Apply 1 application topically daily as needed for  itching.   lidocaine 5 % Commonly known as:  LIDODERM Place 1 patch onto the skin daily. Remove & Discard patch within 12 hours or as directed by MD   lisinopril 20 MG tablet Commonly known as:  PRINIVIL,ZESTRIL TAKE ONE TABLET BY MOUTH EVERY EVENING   mirabegron ER 50 MG Tb24 tablet Commonly known as:  MYRBETRIQ Take 50 mg by mouth daily.   omeprazole 40 MG capsule Commonly known as:  PRILOSEC TAKE 1 CAPSULE BY MOUTH DAILY   polyethylene glycol powder powder Commonly known as:  GLYCOLAX/MIRALAX USE 1 CAPFUL IN 4 TO 8 OUNCES OF LIQUID DAILY What changed:  See the new instructions.   senna-docusate 8.6-50 MG tablet Commonly known as:  Senokot-S Take 1 tablet by mouth 2 (two) times daily.   tamsulosin 0.4 MG Caps capsule Commonly known as:  FLOMAX TAKE 1 CAPSULE BY MOUTH DAILY   testosterone cypionate 200 MG/ML injection Commonly known as:  DEPOTESTOSTERONE CYPIONATE INJECT 42mL INTO THE MUSCLE EVERY 14 DAYS   traZODone 100 MG tablet Commonly known as:  DESYREL Take 100 mg by mouth at bedtime.        DISCHARGE INSTRUCTIONS:    Follow in oncology clinic in 1 week.  If you experience worsening of your admission symptoms, develop shortness of breath, life threatening emergency, suicidal or homicidal thoughts you must seek medical attention immediately by calling 911 or calling your MD immediately  if symptoms  less severe.  You Must read complete instructions/literature along with all the possible adverse reactions/side effects for all the Medicines you take and that have been prescribed to you. Take any new Medicines after you have completely understood and accept all the possible adverse reactions/side effects.   Please note  You were cared for by a hospitalist during your hospital stay. If you have any questions about your discharge medications or the care you received while you were in the hospital after you are discharged, you can call the unit and asked to speak with the hospitalist on call if the hospitalist that took care of you is not available. Once you are discharged, your primary care physician will handle any further medical issues. Please note that NO REFILLS for any discharge medications will be authorized once you are discharged, as it is imperative that you return to your primary care physician (or establish a relationship with a primary care physician if you do not have one) for your aftercare needs so that they can reassess your need for medications and monitor your lab values.    Today   CHIEF COMPLAINT:   Chief Complaint  Patient presents with  . Fatigue    HISTORY OF PRESENT ILLNESS:  Bryan [name]  is a 77 y.o. male with a known history of anxiety, ch back pain, CAD, aortic valve replacement, hypertension, HLD- had back surgery done last week for a fusion of his spines, sent to a rehabilitation Center. He had been on stool softeners but still constipated. Felt significantly weak for last 2 days and so sent to emergency room. He complains of generalized weakness, still able to get up and walk but have excessive weakness. Noted to have significant drop in hemoglobin in last 1 week. Patient denies noticing any active bleeding. ER physician ordered 1 unit of transfusion and water to hospitalist team to admit the patient.   VITAL SIGNS:  Blood pressure (!) 148/84, pulse  70, temperature (!) 97.5 F (36.4 C), temperature source Oral, resp. rate 18, height 5\' 11"  (1.803 m),  weight 79.6 kg (175 lb 8 oz), SpO2 97 %.  I/O:    Intake/Output Summary (Last 24 hours) at 10/31/2017 1202 Last data filed at 10/31/2017 1032 Gross per 24 hour  Intake 1450 ml  Output 2300 ml  Net -850 ml    PHYSICAL EXAMINATION:   GENERAL:77 y.o.-year-old patient lying in the bed with no acute distress.  EYES: Pupils equal, round, reactive to light and accommodation. No scleral icterus. Extraocular muscles intact.  HEENT: Head atraumatic, normocephalic. Oropharynx and nasopharynx clear.  NECK: Supple, no jugular venous distention. No thyroid enlargement, no tenderness.  LUNGS: Normal breath sounds bilaterally, no wheezing, rales,rhonchi or crepitation. No use of accessory muscles of respiration.  CARDIOVASCULAR: S1, S2 normal. No murmurs, rubs, or gallops.  ABDOMEN: Soft, nontender, nondistended. Bowel sounds present. No organomegaly or mass.  EXTREMITIES: No pedal edema, cyanosis, or clubbing.On the mid back he has a recent surgical scar without any active bleeding or swelling around there , were no any secretions. NEUROLOGIC: Cranial nerves II through XII are intact. Muscle strength4/5 in all extremities. Sensation intact. Gait not checked.  PSYCHIATRIC: The patient is alert and oriented x 3.  SKIN: No obvious rash, lesion, or ulcer.     DATA REVIEW:   CBC Recent Labs  Lab 10/31/17 0338  WBC 3.6*  HGB 8.1*  HCT 23.8*  PLT 222    Chemistries  Recent Labs  Lab 10/31/17 0338  NA 133*  K 4.5  CL 102  CO2 25  GLUCOSE 94  BUN 18  CREATININE 1.35*  CALCIUM 8.7*    Cardiac Enzymes No results for input(s): TROPONINI in the last 168 hours.  Microbiology Results  Results for orders placed or performed during the hospital encounter of 10/19/17  MRSA PCR Screening     Status: None   Collection Time: 10/19/17  8:01 PM  Result Value Ref Range Status   MRSA by  PCR NEGATIVE NEGATIVE Final    Comment:        The GeneXpert MRSA Assay (FDA approved for NASAL specimens only), is one component of a comprehensive MRSA colonization surveillance program. It is not intended to diagnose MRSA infection nor to guide or monitor treatment for MRSA infections. Performed at [facility], [place] [address] [place], [place] [address]     RADIOLOGY:  Ct Thoracic Spine Wo Contrast  Result Date: 10/30/2017 CLINICAL DATA:  77 year old male status post multiple prior spine surgeries, most recently revision of spinal hardware on 10/20/2017 for removal of loose L1 pedicle screws and new posterior fusion T10 through L2. Prior L1 burst fracture. EXAM: CT THORACIC SPINE WITHOUT CONTRAST TECHNIQUE: Multidetector CT images of the thoracic were obtained using the standard protocol without intravenous contrast. COMPARISON:  Thoracic spine CT 10/19/2017, and earlier. FINDINGS: Segmentation: Normal.  Same numbering system as on 10/19/2017. Alignment: Stable thoracic vertebral height and alignment since 10/19/2017. Vertebrae: Osteopenia.  No acute osseous abnormality identified. Paraspinal and other soft tissues: Postoperative changes to the posterior paraspinal soft tissues in the lower thoracic spine. Mild soft tissue gas about the T9 and T10 spinous processes. No discrete postoperative fluid collection is evident. New small layering pleural effusions since 10/19/2017. No pericardial effusion. Stable mediastinum, tortuous thoracic aorta. The visible Major airways are patent. There is mild dependent pulmonary atelectasis. Stable and negative visible noncontrast upper abdominal viscera. Disc levels: Unchanged T1-T2 through T8-T9, with capacious thoracic spinal canal at those levels. T9-T10: Right subarticular and bilateral lateral recess epidural space bone cement. Sequelae of  right hemilaminectomy at this level. There is bone cement in the ventral right T9 neural foramen. (Series 3,  image 99). T10-T11: Bilateral transpedicular hardware with superimposed vertebral body bone cement. Right T10 laminectomy. Ventral epidural space bone cement with involvement of the T10 neural foramina, greater on the right. Pedicle screws appear intact. There is posterior bone graft material. T11-T12: Bilateral pedicle screws with vertebral body bone cement. Mild entrapped is a Shin of bone cement into small lateral lumbar veins as well as the ventral epidural space at the T12 level. No foraminal involvement. Pedicle screws appear intact. There is posterior bone graft material. The L1 level is described with the lumbar spine CT today separately. IMPRESSION: 1. Postoperative changes at the T10 through T12 levels since 10/19/2017. Bilateral pedicle screws at each level with superimposed bone cement which does extend outside of the vertebral bodies and involve some of the epidural space, as well as the right T9 and right greater than left T10 neural foramina. Right hemilaminectomy on the right at T9 and T10. 2. Osteopenia.  No acute osseous abnormality identified. 3. New small bilateral layering pleural effusions. Electronically Signed   By: Genevie Ann M.D.   On: 10/30/2017 10:12   Ct Lumbar Spine Wo Contrast  Result Date: 10/30/2017 CLINICAL DATA:  77 year old male status post multiple prior spine surgeries, most recently revision of spinal hardware on 10/20/2017 for removal of loose L1 pedicle screws and new posterior fusion T10 through L2. Prior L1 burst fracture. EXAM: CT LUMBAR SPINE WITHOUT CONTRAST TECHNIQUE: Multidetector CT imaging of the lumbar spine was performed without intravenous contrast administration. Multiplanar CT image reconstructions were also generated. COMPARISON:  Thoracic spine CT today reported separately. Thoracic spine CT 10/19/17. Lumbar spine CT 08/24/2017. FINDINGS: Segmentation: Normal. Alignment: Stable alignment since April. Interval collapse of the L1 vertebral body, further detailed  below. Vertebrae: Osteopenia. L1 compression fracture with 45% loss of vertebral body height since April. Superior and inferior endplate deformities. Anterior superior vertebral body cleft containing fluid. Mild retropulsion of the L1 posterosuperior endplate. Sequelae of pedicle screw hardware removal from L1. The remaining lumbar vertebrae are stable, including prior compression fracture and augmentation of L4. visible sacrum and SI joints appear stable and intact. Paraspinal and other soft tissues: Calcified aortic atherosclerosis. Stable and negative visible noncontrast abdominal viscera. Posterior postoperative changes, no discrete fluid collection is evident. Disc levels: T12-L1: The T12 vertebral findings are described on the thoracic spine CT today. Mild L1 retropulsion. Mild facet hypertrophy. New mild spinal stenosis since April (estimated AP thecal sac now 9 millimeters versus 13 millimeters previously). L1-L2:  Mild disc bulge and endplate spurring are stable. L2-L3: Bilateral L2 pedicle screws are in place with no adverse features. L3 pedicle screws have been removed since 08/24/2017. Stable mild disc bulge. L3-L4: Stable circumferential disc osteophyte complex. Stable mild spinal and bilateral L3 foraminal stenosis. L4-L5: Stable right laminectomy. Stable circumferential disc osteophyte complex and up to moderate left side facet and ligament flavum hypertrophy. Stable mild to moderate bilateral L4 foraminal stenosis. L5-S1: Sequelae of decompression and fusion. Bilateral L5 and S1 pedicle screws with interbody implant appear stable without loosening. Developing interbody and posterior element arthrodesis suspected, but no solid bridging bone yet. IMPRESSION: 1. No acute osseous abnormality identified in the lumbar spine. Moderate L1 compression fracture with 45% loss of vertebral body height and mild retropulsion of bone appears stable since 10/19/2017. 2. Bilateral L2 pedicle screws are in place with  no adverse features. L3 pedicle screws have been  removed since 08/24/2017. 3. Stable L4-L5 and L5-S1 levels, with sequelae of decompression and fusion at the latter. Developing L5-S1 interbody and posterior element arthrodesis, but not yet solid. 4.  Aortic Atherosclerosis (ICD10-I70.0). Electronically Signed   By: Genevie Ann M.D.   On: 10/30/2017 10:37   US Renal  Result Date: 10/29/2017 CLINICAL DATA:  Acute renal failure EXAM: RENAL / URINARY TRACT ULTRASOUND COMPLETE COMPARISON:  08/09/2016 CT abdomen/pelvis FINDINGS: Right Kidney: Length: 11.9 cm. Mildly echogenic right kidney. Right renal parenchymal thickness is low normal. No right hydronephrosis. Small simple right renal cysts, largest 1.8 cm in the upper right kidney. Left Kidney: Length: 11.7 cm. Mildly echogenic left kidney. Left renal parenchymal thickness is low normal. No left hydronephrosis. No left renal mass. Bladder: Appears normal for degree of bladder distention. IMPRESSION: 1. No hydronephrosis. 2. Mildly echogenic normal size kidneys, compatible with the provided history of nonspecific acute renal parenchymal disease. 3. Small simple right renal cysts. 4. Normal bladder. Electronically Signed   By: Ilona Sorrel M.D.   On: 10/29/2017 17:16    EKG:   Orders placed or performed during the hospital encounter of 10/28/17  . EKG 12-Lead  . EKG 12-Lead  . EKG      Management plans discussed with the patient, family and they are in agreement.  CODE STATUS:     Code Status Orders  (From admission, onward)        Start     Ordered   10/28/17 2239  Full code  Continuous     10/28/17 2238    Code Status History    Date Active Date Inactive Code Status Order ID Comments User Context   10/19/2017 1935 10/24/2017 1753 Full Code [id number]  Kary Kos, MD Inpatient   09/28/2017 1701 10/01/2017 1726 Full Code [id number]  Kary Kos, MD Inpatient   06/08/2017 1712 06/10/2017 1904 Full Code [id number]  Kary Kos, MD Inpatient   10/14/2016  1541 10/18/2016 1547 Full Code [id number]  Kary Kos, MD Inpatient   03/08/2016 1334 03/08/2016 2249 Full Code [id number]  Kary Kos, MD Inpatient   10/11/2015 2213 10/13/2015 1730 DNR [id number]  Nicholes Mango, MD ED    Advance Directive Documentation     Most Recent Value  Type of Advance Directive  Healthcare Power of Attorney, Living will  Pre-existing out of facility DNR order (yellow form or pink MOST form)  -  "MOST" Form in Place?  -      TOTAL TIME TAKING CARE OF THIS PATIENT: 35 minutes.    Vaughan Basta M.D on 10/31/2017 at 12:02 PM  Between 7am to 6pm - Pager - [phone]  After 6pm go to [email or web address] - password [email or web address] [facility]  Office  [phone]  CC: Primary care physician; [name], MD   Note: This dictation was prepared with Dragon dictation along with smaller phrase technology. Any transcriptional errors that result from this process are unintentional.

## 2017-10-31 NOTE — Progress Notes (Signed)
Pt tx via wheelchair to personal vehicle. Denies further needs. Pt's sister has packet for discharge including scripts. Both verbalize understanding.

## 2017-10-31 NOTE — Progress Notes (Signed)
Pt states he "wants to leave asap" Pt educated on discharge process by MD and RN. Pt repeatedly verbalizing that he wants to go. Pt new diet ordered as well. Tray ordered for pt

## 2017-10-31 NOTE — Progress Notes (Signed)
Paged Dr Anselm Jungling re: pt's ride is here and he is ready to go. Pt awaiting d/c orders.

## 2017-10-31 NOTE — Progress Notes (Signed)
IV removed. Pt discharged via personal vehicle, sister driving pt to [facility]. Report called to [facility]. Questions asked and answered. Discharge packet provided to pt. NAD.

## 2017-11-01 LAB — HAPTOGLOBIN: Haptoglobin: 236 mg/dL — ABNORMAL HIGH (ref 34–200)

## 2017-11-01 LAB — TESTOSTERONE: Testosterone: <3 ng/dL — ABNORMAL LOW (ref 264–916)

## 2017-11-02 LAB — PROTEIN ELECTROPHORESIS, SERUM
A/G Ratio: 1.4 (ref 0.7–1.7)
Albumin ELP: 3 g/dL (ref 2.9–4.4)
Alpha-1-Globulin: 0.3 g/dL (ref 0.0–0.4)
Alpha-2-Globulin: 0.6 g/dL (ref 0.4–1.0)
Beta Globulin: 0.9 g/dL (ref 0.7–1.3)
Gamma Globulin: 0.6 g/dL (ref 0.4–1.8)
Globulin, Total: 2.2 g/dL (ref 2.2–3.9)
Total Protein ELP: 5.2 g/dL — ABNORMAL LOW (ref 6.0–8.5)

## 2017-11-06 DIAGNOSIS — F0390 Unspecified dementia without behavioral disturbance: Secondary | ICD-10-CM | POA: Diagnosis not present

## 2017-11-06 DIAGNOSIS — F329 Major depressive disorder, single episode, unspecified: Secondary | ICD-10-CM | POA: Diagnosis not present

## 2017-11-06 DIAGNOSIS — I10 Essential (primary) hypertension: Secondary | ICD-10-CM | POA: Diagnosis not present

## 2017-11-06 DIAGNOSIS — D649 Anemia, unspecified: Secondary | ICD-10-CM | POA: Diagnosis not present

## 2017-11-06 DIAGNOSIS — K5900 Constipation, unspecified: Secondary | ICD-10-CM | POA: Diagnosis not present

## 2017-11-06 DIAGNOSIS — S32012D Unstable burst fracture of first lumbar vertebra, subsequent encounter for fracture with routine healing: Secondary | ICD-10-CM | POA: Diagnosis not present

## 2017-11-08 ENCOUNTER — Inpatient Hospital Stay: Payer: Medicare Other

## 2017-11-08 ENCOUNTER — Inpatient Hospital Stay: Payer: Medicare Other | Attending: Hematology and Oncology | Admitting: Hematology and Oncology

## 2017-11-08 ENCOUNTER — Other Ambulatory Visit: Payer: Self-pay | Admitting: Hematology and Oncology

## 2017-11-08 ENCOUNTER — Encounter: Payer: Self-pay | Admitting: Hematology and Oncology

## 2017-11-08 ENCOUNTER — Other Ambulatory Visit: Payer: Self-pay | Admitting: *Deleted

## 2017-11-08 VITALS — BP 150/89 | HR 82 | Temp 97.7°F | Resp 18 | Wt 178.6 lb

## 2017-11-08 DIAGNOSIS — K5900 Constipation, unspecified: Secondary | ICD-10-CM | POA: Diagnosis not present

## 2017-11-08 DIAGNOSIS — E291 Testicular hypofunction: Secondary | ICD-10-CM | POA: Insufficient documentation

## 2017-11-08 DIAGNOSIS — R700 Elevated erythrocyte sedimentation rate: Secondary | ICD-10-CM | POA: Diagnosis not present

## 2017-11-08 DIAGNOSIS — D61818 Other pancytopenia: Secondary | ICD-10-CM | POA: Insufficient documentation

## 2017-11-08 DIAGNOSIS — D509 Iron deficiency anemia, unspecified: Secondary | ICD-10-CM | POA: Insufficient documentation

## 2017-11-08 DIAGNOSIS — D649 Anemia, unspecified: Secondary | ICD-10-CM

## 2017-11-08 DIAGNOSIS — M069 Rheumatoid arthritis, unspecified: Secondary | ICD-10-CM | POA: Diagnosis not present

## 2017-11-08 DIAGNOSIS — D696 Thrombocytopenia, unspecified: Secondary | ICD-10-CM

## 2017-11-08 LAB — RETICULOCYTES
RBC.: 3.24 MIL/uL — ABNORMAL LOW (ref 4.40–5.90)
Retic Count, Absolute: 38.9 10*3/uL (ref 19.0–183.0)
Retic Ct Pct: 1.2 % (ref 0.4–3.1)

## 2017-11-08 LAB — CBC WITH DIFFERENTIAL/PLATELET
Basophils Absolute: 0.1 10*3/uL (ref 0–0.1)
Basophils Relative: 1 %
Eosinophils Absolute: 0.2 10*3/uL (ref 0–0.7)
Eosinophils Relative: 3 %
HCT: 27.2 % — ABNORMAL LOW (ref 40.0–52.0)
Hemoglobin: 9 g/dL — ABNORMAL LOW (ref 13.0–18.0)
Lymphocytes Relative: 17 %
Lymphs Abs: 1 10*3/uL (ref 1.0–3.6)
MCH: 28.7 pg (ref 26.0–34.0)
MCHC: 32.9 g/dL (ref 32.0–36.0)
MCV: 87 fL (ref 80.0–100.0)
Monocytes Absolute: 0.4 10*3/uL (ref 0.2–1.0)
Monocytes Relative: 8 %
Neutro Abs: 4.1 10*3/uL (ref 1.4–6.5)
Neutrophils Relative %: 71 %
Platelets: 200 10*3/uL (ref 150–440)
RBC: 3.13 MIL/uL — ABNORMAL LOW (ref 4.40–5.90)
RDW: 16.1 % — ABNORMAL HIGH (ref 11.5–14.5)
WBC: 5.8 10*3/uL (ref 3.8–10.6)

## 2017-11-08 LAB — IRON AND TIBC
Iron: 23 ug/dL — ABNORMAL LOW (ref 45–182)
Saturation Ratios: 6 % — ABNORMAL LOW (ref 17.9–39.5)
TIBC: 382 ug/dL (ref 250–450)
UIBC: 359 ug/dL

## 2017-11-08 LAB — SAMPLE TO BLOOD BANK

## 2017-11-08 LAB — SEDIMENTATION RATE: Sed Rate: 56 mm/hr — ABNORMAL HIGH (ref 0–20)

## 2017-11-08 LAB — FERRITIN: Ferritin: 86 ng/mL (ref 24–336)

## 2017-11-08 NOTE — Progress Notes (Addendum)
[facility] day:  11/08/2017  Chief Complaint: Bryan [name] is a 77 y.o. male with rheumatoid arthritis, iron deficiency anemia, and mild pancytopenia who is seen for reassessment after interval hospitalization.  HPI:  The patient was last seen in the hematology clinic on 12/17/2016.  At that time, he described arthritis pain off Plaquenil.  He was a little stronger after completing his physical therapy.  He continued to have mild sweats.  Bone marrow FISH studies were negative.   He was lost to follow-up.  He was admitted to [facility] from 10/28/2017 - 10/31/2017 with symptomatic anemia.  He received 2 units of PRBCs.  Hemoglobin improved from 6.2 to 8.1.  Work-up revealed the following normal labs: ferritin (65), B12 (401), TSH.  Iron saturation was 7% with a TIBC of 317.  LDH was 205 (98-192).  Retic was 2.6%.  Creatinine was 1.37 (CrCl 47.3 ml/min).  SPEP was normal.  Haptoglobin was 236 (normal).  Testosterone was < 3 (low).  Peripheral smear revealed leukopenia with normal WBC morphology. Normocytic anemia with polychromasia, anisocytosis and rouleaux formation.   During the interm, he has had more energy.  He continues to have back pain. He is wearing a brace in clinic today. He is experiencing constipation that he attributes to his daily oral iron therapy. He is on oral B12 supplementation daily. He is currently at [facility] for rehab, however he is planned to discharge on 11/09/2017.    Patient started back on exogenous testosterone on 11/03/2017.  He receives testosterone every 2 weeks.    Patient maintains an adequate appetite, and notes that he is eating well. His diet is rich in iron.  Weight, compared to his last visit to the clinic, has decreased by 2 pounds.   Patient complains of pain, rated 5/10, in clinic today.     Past Medical History:  Diagnosis Date  . Abnormal CT scan    Asymmetric left rectal wall thickening    . Anemia   . Anxiety   . Arrhythmia   . Chronic lower back pain   . Complication of anesthesia    Memory loss 09/2015  . Coronary artery disease   . Depression   . GERD (gastroesophageal reflux disease)   . Glaucoma   . H/O thoracic aortic aneurysm repair   . Heart murmur   . History of being hospitalized    memory lose kidney funtion down blood pressure up  . History of blood transfusion    "think he had one when he had heart valve OR" (10/14/2016); "none since" (10/19/2017)  . History of chicken pox   . Hyperlipidemia   . Hypertension   . Lumbar stenosis   . Neuropathy   . Osteoarthritis    worse in feet and ankles  . Paroxysmal A-fib ([place])   . Poor short term memory    takes Aricept  . Rheumatoid arthritis ([place])    "all over" (10/14/2016)  . Schizophrenia ([place])   . Seasonal allergies   . Valvular heart disease     Past Surgical History:  Procedure Laterality Date  . AORTIC VALVE REPLACEMENT  2007   [facility].  [place], [place]; "pig valve"  . APPENDECTOMY  2010  . BACK SURGERY    . CARDIAC VALVE REPLACEMENT    . CATARACT EXTRACTION W/ INTRAOCULAR LENS  IMPLANT, BILATERAL Bilateral 2012  . COLONOSCOPY WITH PROPOFOL N/A 09/24/2016   Procedure: COLONOSCOPY WITH PROPOFOL;  Surgeon: Vicente Males,  Bailey Mech, MD;  Location: [facility];  Service: Endoscopy;  Laterality: N/A;  . ESOPHAGOGASTRODUODENOSCOPY (EGD) WITH PROPOFOL N/A 09/24/2016   Procedure: ESOPHAGOGASTRODUODENOSCOPY (EGD) WITH PROPOFOL;  Surgeon: Jonathon Bellows, MD;  Location: [facility] ENDOSCOPY;  Service: Endoscopy;  Laterality: N/A;  . GIVENS CAPSULE STUDY N/A 09/24/2016   Procedure: GIVENS CAPSULE STUDY;  Surgeon: Jonathon Bellows, MD;  Location: [facility] ENDOSCOPY;  Service: Endoscopy;  Laterality: N/A;  . HAMMER TOE SURGERY Right 10/09/2015   Procedure: HAMMER TOE REPAIR WITH K-WIRE FIXATION RIGHT SECOND TOE;  Surgeon: Albertine Patricia, DPM;  Location: [facility];  Service: Podiatry;  Laterality: Right;  WITH LOCAL  . JOINT  REPLACEMENT    . LAMINECTOMY WITH POSTERIOR LATERAL ARTHRODESIS LEVEL 3 N/A 09/28/2017   Procedure: LUMBAR  POSTEROR  FUSION REVISION - LUMBAR ONE -TWO, LUMBAR TWO -THREE,THREE-FOUR ,BILATERAL ARTHRODESIS REMOVAL LUMBAR THREE HARDWARE;  Surgeon: Kary Kos, MD;  Location: [place];  Service: Neurosurgery;  Laterality: N/A;  . LAMINECTOMY WITH POSTERIOR LATERAL ARTHRODESIS LEVEL 3 N/A 10/20/2017   Procedure: Revision fusion and removal of hardware Lumbar one, Pedicle screw fixation thoracic ten-lumbar two, thoracic nine-ten laminotomy, Posterior Lumbar Arthrodesis thoracic ten-lumbar two ;  Surgeon: Kary Kos, MD;  Location: [facility];  Service: Neurosurgery;  Laterality: N/A;  . LAPAROSCOPIC CHOLECYSTECTOMY  2010  . LUMBAR FUSION Right 06/08/2017   LUMBAR THREE-FOUR, LUMBAR FOUR-FIVE POSTEROLATERAL ARTHRODESIS WITH RIGHT LUMBAR FOUR-FIVE LAMINECTOMY/FORAMINOTOMY  . LUMBAR LAMINECTOMY/DECOMPRESSION MICRODISCECTOMY Right 03/08/2016   Procedure: Laminectomy and Foraminotomy - Lumbar Five-Sacral One Right;  Surgeon: Kary Kos, MD;  Location: [place];  Service: Neurosurgery;  Laterality: Right;  Right  . MULTIPLE TOOTH EXTRACTIONS     "3"  . POSTERIOR LUMBAR FUSION  10/14/2016   L5-S1  . REPLACEMENT TOTAL KNEE Left 2003  . THORACIC AORTIC ANEURYSM REPAIR  2010    Family History  Problem Relation Age of Onset  . Heart failure Mother   . Hypertension Mother   . Asthma Mother   . Heart attack Father 47       MI  . Hypertension Sister   . Prostate cancer Neg Hx   . Bladder Cancer Neg Hx   . Kidney cancer Neg Hx     Social History:  reports that he quit smoking about 36 years ago. His smoking use included cigarettes. He has a 32.00 pack-year smoking history. He has never used smokeless tobacco. He reports that he drinks alcohol. He reports that he does not use drugs.  He smoked 1 pack a day x 12 years.  He stopped smoking 30 years ago.  He drinks a glass of wine occasionally.  He is a Software engineer.  [place]  The patient is accompanied by [name], a friend, today.  Allergies:  Allergies  Allergen Reactions  . Penicillins Hives, Swelling and Other (See Comments)    SWELLING REACTION UNSPECIFIED PATIENT HAS TAKEN AMOXICILLIN ON MED HX FROM [facility] PATIENT HAS HAD A PCN REACTION WITH IMMEDIATE RASH, FACIAL/TONGUE/THROAT SWELLING, SOB, OR LIGHTHEADEDNESS WITH HYPOTENSION:  #  #  #  YES  #  #  #   Has patient had a PCN reaction causing severe rash involving mucus membranes or skin necrosis: No Has patient had a PCN reaction that required hospitalization No Has patient had a PCN reaction occurring within the last 10 years: No  . Demerol [Meperidine] Hives and Nausea And Vomiting    Current Medications: Current Outpatient Medications  Medication  Sig Dispense Refill  . acetaminophen (TYLENOL) 325 MG tablet Take 650 mg by mouth 4 (four) times daily.    Marland Kitchen amLODipine (NORVASC) 5 MG tablet TAKE ONE TABLET BY MOUTH EVERY DAY 90 tablet 1  . aspirin 325 MG EC tablet Take 325 mg by mouth daily at 2 PM.     . atorvastatin (LIPITOR) 40 MG tablet Take 1 tablet (40 mg total) by mouth daily. (Patient taking differently: Take 40 mg by mouth daily at 6 PM. ) 90 tablet 3  . cetirizine (ZYRTEC) 10 MG tablet Take 10 mg by mouth daily.    . cyclobenzaprine (FLEXERIL) 5 MG tablet Take 1 tablet (5 mg total) by mouth 3 (three) times daily as needed for muscle spasms. (Patient taking differently: Take 10 mg by mouth 3 (three) times daily as needed for muscle spasms. ) 30 tablet 0  . donepezil (ARICEPT) 5 MG tablet Take 5 mg by mouth at bedtime.     . fenofibrate 160 MG tablet TAKE ONE TABLET BY MOUTH EVERY DAY (Patient taking differently: TAKE ONE TABLET BY MOUTH DAILY) 30 tablet 3  . ferrous sulfate 325 (65 FE) MG tablet Take 1 tablet (325 mg total) by mouth 2 (two) times daily with a meal. 60 tablet 3  . FLUoxetine (PROZAC) 20 MG capsule TAKE 1 CAPSULE BY MOUTH EVERY  DAY 90 capsule 1  . folic acid (FOLVITE) 1 MG tablet Take 1 mg by mouth 2 (two) times daily.    Marland Kitchen gabapentin (NEURONTIN) 400 MG capsule Take 400 mg by mouth 3 (three) times daily.     Marland Kitchen HYDROcodone-acetaminophen (NORCO) 10-325 MG tablet Take 1 tablet by mouth every 6 (six) hours as needed for moderate pain. 20 tablet 0  . hydrocortisone cream 1 % Apply 1 application topically daily as needed for itching.    . lidocaine (LIDODERM) 5 % Place 1 patch onto the skin daily. Remove & Discard patch within 12 hours or as directed by MD    . lisinopril (PRINIVIL,ZESTRIL) 20 MG tablet TAKE ONE TABLET BY MOUTH EVERY EVENING 90 tablet 3  . mirabegron ER (MYRBETRIQ) 50 MG TB24 tablet Take 50 mg by mouth daily.    Marland Kitchen omeprazole (PRILOSEC) 40 MG capsule TAKE 1 CAPSULE BY MOUTH DAILY 30 capsule 3  . polyethylene glycol powder (GLYCOLAX/MIRALAX) powder USE 1 CAPFUL IN 4 TO 8 OUNCES OF LIQUID DAILY (Patient taking differently: USE 1 CAPFUL IN 4 TO 8 OUNCES OF LIQUID TWICE DAILY) 527 g 0  . senna-docusate (SENOKOT-S) 8.6-50 MG tablet Take 1 tablet by mouth 2 (two) times daily.    . tamsulosin (FLOMAX) 0.4 MG CAPS capsule TAKE 1 CAPSULE BY MOUTH DAILY 90 capsule 0  . testosterone cypionate (DEPOTESTOSTERONE CYPIONATE) 200 MG/ML injection INJECT 68m INTO THE MUSCLE EVERY 14 DAYS 10 mL 0  . traZODone (DESYREL) 100 MG tablet Take 100 mg by mouth at bedtime.    .Marland Kitchenamoxicillin (AMOXIL) 500 MG capsule Take 2,000 mg by mouth once. 1 hour prior to dental appointment     No current facility-administered medications for this visit.     Review of Systems:  GENERAL:  Feels better.  More energy.  No fevers, sweats.  Weight down 2 pounds since last visit. PERFORMANCE STATUS (ECOG):  1-2. HEENT:  No visual changes, runny nose, sore throat, mouth sores or tenderness. Lungs: No shortness of breath or cough.  No hemoptysis. Cardiac:  No chest pain, palpitations, orthopnea, or PND. GI:  Eating well.  Constipation.  No nausea,  vomiting, diarrhea, melena or hematochezia. GU:  No urgency, frequency, dysuria, or hematuria. Musculoskeletal:  s/p back surgery on 10/20/2017.  Wearing a brace.  Rheumatoid arthritis.  No muscle tenderness. Extremities:  No pain or swelling. Skin:  No rashes or skin changes. Neuro:  No headache, numbness or weakness, balance or coordination issues. Endocrine:  No diabetes, thyroid issues, hot flashes or night sweats. Psych:  No mood changes, depression or anxiety. Pain:  Back pain, 5 out of 10. Review of systems:  All other systems reviewed and found to be negative.   Physical Exam: Blood pressure (!) 150/89, pulse 82, temperature 97.7 F (36.5 C), temperature source Tympanic, resp. rate 18, weight 178 lb 9 oz (81 kg), SpO2 98 %. GENERAL:  Well developed, well nourished, elderly gentleman sitting comfortably in a wheelchair in the exam room in no acute distress. MENTAL STATUS:  Alert and oriented to person, place and time. HEAD:  Lilyan Punt and goatee.  Normocephalic, atraumatic, face symmetric, no Cushingoid features. EYES:  Hazel eyes.  Pupils equal round and reactive to light and accomodation.  No conjunctivitis or scleral icterus. ENT:  Oropharynx clear without lesion.  Tongue normal. Mucous membranes moist.  RESPIRATORY:  Clear to auscultation without rales, wheezes or rhonchi. CARDIOVASCULAR:  Regular rate and rhythm without murmur, rub or gallop. ABDOMEN:  Soft, non-tender, with active bowel sounds, and no hepatosplenomegaly.  No masses. BACK:  Wearing a back brace. SKIN:  Pale.  No rashes, ulcers or lesions. EXTREMITIES: No edema, no skin discoloration or tenderness.  No palpable cords. LYMPH NODES: No palpable cervical, supraclavicular, axillary or inguinal adenopathy  NEUROLOGICAL: Unremarkable. PSYCH:  Appropriate.    No visits with results within 3 Day(s) from this visit.  Latest known visit with results is:  Admission on 10/28/2017, Discharged on 10/31/2017  Component  Date Value Ref Range Status  . WBC 10/28/2017 3.9  3.8 - 10.6 K/uL Final  . RBC 10/28/2017 2.22* 4.40 - 5.90 MIL/uL Final  . Hemoglobin 10/28/2017 6.2* 13.0 - 18.0 g/dL Final  . HCT 10/28/2017 18.8* 40.0 - 52.0 % Final  . MCV 10/28/2017 84.7  80.0 - 100.0 fL Final  . MCH 10/28/2017 27.8  26.0 - 34.0 pg Final  . MCHC 10/28/2017 32.8  32.0 - 36.0 g/dL Final  . RDW 10/28/2017 15.4* 11.5 - 14.5 % Final  . Platelets 10/28/2017 185  150 - 440 K/uL Final  . Neutrophils Relative % 10/28/2017 56  % Final  . Neutro Abs 10/28/2017 2.2  1.4 - 6.5 K/uL Final  . Lymphocytes Relative 10/28/2017 25  % Final  . Lymphs Abs 10/28/2017 1.0  1.0 - 3.6 K/uL Final  . Monocytes Relative 10/28/2017 11  % Final  . Monocytes Absolute 10/28/2017 0.4  0.2 - 1.0 K/uL Final  . Eosinophils Relative 10/28/2017 7  % Final  . Eosinophils Absolute 10/28/2017 0.3  0 - 0.7 K/uL Final  . Basophils Relative 10/28/2017 1  % Final  . Basophils Absolute 10/28/2017 0.0  0 - 0.1 K/uL Final   Performed at [facility], [address] [place], [place] [address]  . Sodium 10/28/2017 131* 135 - 145 mmol/L Final  . Potassium 10/28/2017 4.7  3.5 - 5.1 mmol/L Final  . Chloride 10/28/2017 99* 101 - 111 mmol/L Final  . CO2 10/28/2017 22  22 - 32 mmol/L Final  . Glucose, Bld 10/28/2017 117* 65 - 99 mg/dL Final  . BUN 10/28/2017 50* 6 - 20 mg/dL Final  .  Creatinine, Ser 10/28/2017 2.39* 0.61 - 1.24 mg/dL Final  . Calcium 10/28/2017 8.5* 8.9 - 10.3 mg/dL Final  . GFR calc non Af Amer 10/28/2017 24* >60 mL/min Final  . GFR calc Af Amer 10/28/2017 28* >60 mL/min Final   Comment: (NOTE) The eGFR has been calculated using the CKD EPI equation. This calculation has not been validated in all clinical situations. eGFR's persistently <60 mL/min signify possible Chronic Kidney Disease.   Georgiann Hahn gap 10/28/2017 10  5 - 15 Final   Performed at [facility], [address] [place], [place] [address]  . ABO/RH(D) 10/28/2017  O POS   Final  . Antibody Screen 10/28/2017 NEG   Final  . Sample Expiration 10/28/2017 10/31/2017   Final  . Unit Number 10/28/2017 [id number]   Final  . Blood Component Type 10/28/2017 RBC, LR IRR   Final  . Unit division 10/28/2017 00   Final  . Status of Unit 10/28/2017 ISSUED,FINAL   Final  . Transfusion Status 10/28/2017 OK TO TRANSFUSE   Final  . Crossmatch Result 10/28/2017 Compatible   Final  . Unit Number 10/28/2017 [id number]   Final  . Blood Component Type 10/28/2017 RBC, LR IRR   Final  . Unit division 10/28/2017 00   Final  . Status of Unit 10/28/2017 ISSUED,FINAL   Final  . Unit tag comment 10/28/2017 IRRADIATED PRODUCT   Final  . Transfusion Status 10/28/2017 OK TO TRANSFUSE   Final  . Crossmatch Result 10/28/2017    Final                   Value:Compatible Performed at [facility], [address], [place], [place] [address]   . Order Confirmation 10/28/2017    Final                   Value:ORDER PROCESSED BY BLOOD BANK Performed at [facility], [address] [place], [place] [address]   . ISSUE DATE / TIME 10/28/2017 [id number]   Final  . Blood Product Unit Number 10/28/2017 [id number]   Final  . PRODUCT CODE 10/28/2017 [id number]   Final  . Unit Type and Rh 10/28/2017 5100   Final  . Blood Product Expiration Date 10/28/2017 [id number]   Final  . ISSUE DATE / TIME 10/28/2017 [id number]   Final  . Blood Product Unit Number 10/28/2017 [id number]   Final  . PRODUCT CODE 10/28/2017 [id number]   Final  . Unit Type and Rh 10/28/2017 5100   Final  . Blood Product Expiration Date 10/28/2017 [id number]   Final  . Sodium 10/29/2017 136  135 - 145 mmol/L Final  . Potassium 10/29/2017 4.3  3.5 - 5.1 mmol/L Final  . Chloride 10/29/2017 106  101 - 111 mmol/L Final  . CO2 10/29/2017 22  22 - 32 mmol/L Final  . Glucose, Bld 10/29/2017 97  65 - 99 mg/dL Final  . BUN 10/29/2017 42* 6 - 20 mg/dL Final  . Creatinine, Ser 10/29/2017 1.96*  0.61 - 1.24 mg/dL Final  . Calcium 10/29/2017 8.4* 8.9 - 10.3 mg/dL Final  . GFR calc non Af Amer 10/29/2017 31* >60 mL/min Final  . GFR calc Af Amer 10/29/2017 36* >60 mL/min Final   Comment: (NOTE) The eGFR has been calculated using the CKD EPI equation. This calculation has not been validated in all clinical situations. eGFR's persistently <60 mL/min signify possible Chronic Kidney Disease.   . Anion gap 10/29/2017 8  5 - 15 Final  Performed at [facility], [address] [place], [place] [address]  . WBC 10/29/2017 3.6* 3.8 - 10.6 K/uL Final  . RBC 10/29/2017 2.53* 4.40 - 5.90 MIL/uL Final  . Hemoglobin 10/29/2017 7.2* 13.0 - 18.0 g/dL Final  . HCT 10/29/2017 21.4* 40.0 - 52.0 % Final  . MCV 10/29/2017 84.4  80.0 - 100.0 fL Final  . MCH 10/29/2017 28.5  26.0 - 34.0 pg Final  . MCHC 10/29/2017 33.7  32.0 - 36.0 g/dL Final  . RDW 10/29/2017 15.7* 11.5 - 14.5 % Final  . Platelets 10/29/2017 191  150 - 440 K/uL Final   Performed at [facility], [address] [place], [place] [address]  . Color, Urine 10/28/2017 STRAW* YELLOW Final  . APPearance 10/28/2017 CLEAR* CLEAR Final  . Specific Gravity, Urine 10/28/2017 1.006  1.005 - 1.030 Final  . pH 10/28/2017 5.0  5.0 - 8.0 Final  . Glucose, UA 10/28/2017 NEGATIVE  NEGATIVE mg/dL Final  . Hgb urine dipstick 10/28/2017 NEGATIVE  NEGATIVE Final  . Bilirubin Urine 10/28/2017 NEGATIVE  NEGATIVE Final  . Ketones, ur 10/28/2017 NEGATIVE  NEGATIVE mg/dL Final  . Protein, ur 10/28/2017 NEGATIVE  NEGATIVE mg/dL Final  . Nitrite 10/28/2017 NEGATIVE  NEGATIVE Final  . Leukocytes, UA 10/28/2017 NEGATIVE  NEGATIVE Final  . WBC, UA 10/28/2017 0-5  0 - 5 WBC/hpf Final  . Bacteria, UA 10/28/2017 NONE SEEN  NONE SEEN Final  . Squamous Epithelial / LPF 10/28/2017 NONE SEEN  0 - 5 Final   Performed at [facility], [address] [place], [place] [address]  . Fecal Occult Bld 10/29/2017 NEGATIVE  NEGATIVE Final    Performed at [facility], [address] [place], [place] [address]  . WBC 10/30/2017 3.4* 3.8 - 10.6 K/uL Final  . RBC 10/30/2017 2.30* 4.40 - 5.90 MIL/uL Final  . Hemoglobin 10/30/2017 6.5* 13.0 - 18.0 g/dL Final  . HCT 10/30/2017 20.0* 40.0 - 52.0 % Final  . MCV 10/30/2017 87.1  80.0 - 100.0 fL Final  . MCH 10/30/2017 28.4  26.0 - 34.0 pg Final  . MCHC 10/30/2017 32.6  32.0 - 36.0 g/dL Final  . RDW 10/30/2017 15.8* 11.5 - 14.5 % Final  . Platelets 10/30/2017 186  150 - 440 K/uL Final   Performed at [facility], [address], [place], [place] [address]  . Sodium 10/30/2017 134* 135 - 145 mmol/L Final  . Potassium 10/30/2017 4.6  3.5 - 5.1 mmol/L Final  . Chloride 10/30/2017 105  101 - 111 mmol/L Final  . CO2 10/30/2017 23  22 - 32 mmol/L Final  . Glucose, Bld 10/30/2017 91  65 - 99 mg/dL Final  . BUN 10/30/2017 25* 6 - 20 mg/dL Final  . Creatinine, Ser 10/30/2017 1.37* 0.61 - 1.24 mg/dL Final  . Calcium 10/30/2017 8.4* 8.9 - 10.3 mg/dL Final  . GFR calc non Af Amer 10/30/2017 47* >60 mL/min Final  . GFR calc Af Amer 10/30/2017 55* >60 mL/min Final   Comment: (NOTE) The eGFR has been calculated using the CKD EPI equation. This calculation has not been validated in all clinical situations. eGFR's persistently <60 mL/min signify possible Chronic Kidney Disease.   Georgiann Hahn gap 10/30/2017 6  5 - 15 Final   Performed at [facility], [address] [place], [place] [address]  . Order Confirmation 10/30/2017    Final  Value:ORDER PROCESSED BY BLOOD BANK Performed at [facility], [address], [place], [place] [address]   . Iron 10/30/2017 21* 45 - 182 ug/dL Final  . TIBC 10/30/2017 317  250 - 450 ug/dL Final  . Saturation Ratios 10/30/2017 7* 17.9 - 39.5 % Final  . UIBC 10/30/2017 296  ug/dL Final   Performed at [facility], [address], [place], [place] [address]  . LDH 10/30/2017 205* 98 - 192 U/L Final    Performed at [facility], [address] [place], [place] [address]  . Retic Ct Pct 10/30/2017 2.6  0.4 - 3.1 % Final  . RBC. 10/30/2017 2.66* 4.40 - 5.90 MIL/uL Final  . Retic Count, Absolute 10/30/2017 69.2  19.0 - 183.0 K/uL Final   Performed at [facility], [address] [place], [place] [address]  . Ferritin 10/30/2017 65  24 - 336 ng/mL Final   Performed at [facility], [address] [place], [place] [address]  . WBC 10/31/2017 3.6* 3.8 - 10.6 K/uL Final  . RBC 10/31/2017 2.82* 4.40 - 5.90 MIL/uL Final  . Hemoglobin 10/31/2017 8.1* 13.0 - 18.0 g/dL Final  . HCT 10/31/2017 23.8* 40.0 - 52.0 % Final  . MCV 10/31/2017 84.7  80.0 - 100.0 fL Final  . MCH 10/31/2017 28.6  26.0 - 34.0 pg Final  . MCHC 10/31/2017 33.8  32.0 - 36.0 g/dL Final  . RDW 10/31/2017 15.4* 11.5 - 14.5 % Final  . Platelets 10/31/2017 222  150 - 440 K/uL Final   Performed at [facility], [address] [place], [place] [address]  . Sodium 10/31/2017 133* 135 - 145 mmol/L Final  . Potassium 10/31/2017 4.5  3.5 - 5.1 mmol/L Final  . Chloride 10/31/2017 102  101 - 111 mmol/L Final  . CO2 10/31/2017 25  22 - 32 mmol/L Final  . Glucose, Bld 10/31/2017 94  65 - 99 mg/dL Final  . BUN 10/31/2017 18  6 - 20 mg/dL Final  . Creatinine, Ser 10/31/2017 1.35* 0.61 - 1.24 mg/dL Final  . Calcium 10/31/2017 8.7* 8.9 - 10.3 mg/dL Final  . GFR calc non Af Amer 10/31/2017 48* >60 mL/min Final  . GFR calc Af Amer 10/31/2017 56* >60 mL/min Final   Comment: (NOTE) The eGFR has been calculated using the CKD EPI equation. This calculation has not been validated in all clinical situations. eGFR's persistently <60 mL/min signify possible Chronic Kidney Disease.   Georgiann Hahn gap 10/31/2017 6  5 - 15 Final   Performed at [facility], [address] [place], [place] [address]  . Path Review 10/31/2017 Smear review shows leukopenia with normal WBC morphology. Normocytic anemia  with polychromasia, anisocytosis and rouleaux formation. Platelet morphology is normal. Patient is followed by Hematology oncology. Dr. Luana Shu.   Final   Performed at [facility], [address], [place], [place] [address]  . TSH 10/31/2017 1.598  0.350 - 4.500 uIU/mL Final   Comment: Performed by a 3rd Generation assay with a functional sensitivity of <=0.01 uIU/mL. Performed at [facility], [address], [place], [place] [address]   . Testosterone 10/31/2017 <3* 264 - 916 ng/dL Final   Comment: (NOTE) Adult male reference interval is based on a population of healthy nonobese males (BMI <30) between 43 and 30 years old. [place], [place] [phone]. PMID: 66294765. Performed At: [facility] [place], [place] [id number] Rush Farmer MD [id number] Performed at [facility], [address] [place], [place] [address]   . Vitamin B-12 10/31/2017 401  180 - 914 pg/mL Final   Comment: (NOTE) This assay is not validated for testing neonatal or myeloproliferative syndrome specimens for Vitamin B12 levels. Performed at [facility], [place] [address] [place], [place] [address]   . Haptoglobin 10/31/2017 236* 34 - 200 mg/dL Final   Comment: (NOTE) Performed At: [facility] [place], [place] [id number] Rush Farmer MD [id number] Performed at [facility], [address] [place], [place] [address]   . Total Protein ELP 10/31/2017 5.2* 6.0 - 8.5 g/dL Final  . Albumin ELP 10/31/2017 3.0  2.9 - 4.4 g/dL Final  . Alpha-1-Globulin 10/31/2017 0.3  0.0 - 0.4 g/dL Final  . Alpha-2-Globulin 10/31/2017 0.6  0.4 - 1.0 g/dL Final  . Beta Globulin 10/31/2017 0.9  0.7 - 1.3 g/dL Final  . Gamma Globulin 10/31/2017 0.6  0.4 - 1.8 g/dL Final  . M-Spike, % 10/31/2017 Not Observed  Not Observed g/dL Final  . SPE Interp. 10/31/2017 Comment   Final   Comment: (NOTE) The SPE pattern appears  essentially unremarkable. Evidence of monoclonal protein is not apparent. Performed At: [facility] [place], [place] [id number] Rush Farmer MD [id number]   . Comment 10/31/2017 Comment   Final   Comment: (NOTE) Protein electrophoresis scan will follow via computer, mail, or courier delivery.   Marland Kitchen GLOBULIN, TOTAL 10/31/2017 2.2  2.2 - 3.9 g/dL Corrected  . A/G Ratio 10/31/2017 1.4  0.7 - 1.7 Corrected   Performed at [facility], [address] [place], [place] [id number]    Assessment:  Bryan [name] is a 77 y.o. male with rheumatoid arthritis and progressive anemia over the past 2 years.  He has been on Plaquenil and hydralazine which can cause anemia.  Plaquenil was discontinued on 08/26/2016.  He denies any exposure to radiation or toxins.  He denies any prior history of hepatitis, prior transfusions or HIV risk factors.  He denies any herbal products.  Diet is good.  EGD on 09/24/2016 revealed patchy candidiasis in the entire esophagus.  There were two non-bleeding angioectasias in the duodenum treated with argon plasma coagulation.  Gastritis was biopsied.  Pathology revealed mild chronic gastritis negative for H pylori, dysplasia and malignancy.  Colonoscopy on 09/24/2016 revealed diverticulosis in the entire colon, one 3 mm polyp in the cecum, and non-bleeding internal hemorrhoids.  Pathology from the cecal polyp revealed a tubular adenoma negative for high grade dysplasia and malignancy.    He denies any melena or hematochezia. He denies any hematuria.  CBC on 06/02/2016 revealed a hematocrit of 29.5, hemoglobin 10.0, MCV 92.3, platelets 155,000, and WBC 4200.  Stool was guaiac negative x 2 in 06/2016.  Labs on 06/04/2016 revealed a ferritin of 124, iron saturation 22% and TIBC of 370.  B12 was 883 on 06/18/2014.  Work-up on 07/19/2016 revealed a hematocrit of 31.7, hemoglobin 10.6, MCV 91, platelets 143,000, white count 3000 with an ANC  of 1800.  Absolute lymphocyte count was 700 (low). Creatinine was 1.57.  LDH was 269 (98-192).  Normal labs included:  uric acid, folate, Coombs, SPEP, and copper.  Iron studies included a saturation of 11% (low) and a TIBC of 454 (high) c/w iron deficiency anemia.  Sed rate was 19.  Retic was 0.9% (low).  He is on oral iron with vitamin C.  Chest, abdomen, and pelvic CTon 08/09/2016 revealed no acute findings  or clear evidence of malignancy in the chest, abdomen or pelvis. There was asymmetric left rectal wall thickeningpossibly secondary to volume averaging with the prostate gland.  Bone marrow aspirate and biopsy on 11/04/2016 revealed a normocellular marrow (20%) with trilineage hematopoiesis and maturation.  There was mild megakaryocytic atypia.  Storage iron was present.  There were rare ringed sideroblasts.  Correlation with cytogenetics is recommended to exclude a low grade myelodysplastic syndrome with atypia. Cytogenetics and FISH were normal (46, XY).  He was admitted to [facility] on 10/14/2016 - 10/18/2016 for spinal stenosis of the lumbar region.  He underwent redo decompressive lumbar laminotomy L5-S1 with radical foraminotomy the L5 and S1 nerve root with complete medial facetectomies. He underwent back surgery (revision fusion removal hardware and pedicle screw fixation) on 10/20/2017.  He was admitted to [facility] from 10/28/2017 - 10/31/2017 with symptomatic anemia.  He received 2 units of PRBCs.  Hemoglobin improved from 6.2 to 8.1.  Work-up revealed the following normal labs: ferritin (65), B12 (401), TSH, SPEP, haptoglobin.  Testosterone was < 3 (low).  Iron saturation was 7% with a TIBC of 317.  LDH was 205 (98-192).  Retic was 2.6%.  Creatinine was 1.37 (CrCl 47.3 ml/min).  Peripheral smear revealed leukopenia with normal WBC morphology. Normocytic anemia with polychromasia, anisocytosis and rouleaux formation.   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  He has treated  with Septra then switched to doxycycline.  He underwent back surgery on 10/20/2017.  Symptomatically, he is doing well. He complains of pain in his back follow surgery. He is in a brace. Patient also has pain in his RIGHT wrist and BILATERAL knees.  Exam is stable.  Hemoglobin is 9.0.  Plan: 1.  Review interval hospitalization.  Discuss work-up.  Suspect anemia may be due to low testosterone.  Patient recently restarted.  Discuss additional studies today.  Discuss rouleaux on peripheral smear.  Doubt hemolysis. Hemoglobin improving.  2.  Labs today:  CBC with diff, iron studies, ferritin, sed rate, cold agglutinin, hold tube, retic. 3.  RTC in 1 week for MD assessment and labs (CBC with diff, hold tube) and +/- PRBCs.    Honor Loh, NP  11/08/2017, 11:40 AM    I saw and evaluated the patient, participating in the key portions of the service and reviewing pertinent diagnostic studies and records.  I reviewed the nurse practitioner's note and agree with the findings and the plan.  The assessment and plan were discussed with the patient.  Several questions were asked by the patient and answered.   Nolon Stalls, MD 11/08/2017,11:40 AM

## 2017-11-08 NOTE — Progress Notes (Signed)
Pt in for follow up, reports improvement in energy level.  Pt is at [facility] and will be discharged home tomorrow.

## 2017-11-09 LAB — COLD AGGLUTININ TITER: Cold Agglutinin Titer: NEGATIVE

## 2017-11-11 DIAGNOSIS — I2510 Atherosclerotic heart disease of native coronary artery without angina pectoris: Secondary | ICD-10-CM | POA: Diagnosis not present

## 2017-11-11 DIAGNOSIS — E119 Type 2 diabetes mellitus without complications: Secondary | ICD-10-CM | POA: Diagnosis not present

## 2017-11-11 DIAGNOSIS — T84216D Breakdown (mechanical) of internal fixation device of vertebrae, subsequent encounter: Secondary | ICD-10-CM | POA: Diagnosis not present

## 2017-11-11 DIAGNOSIS — I10 Essential (primary) hypertension: Secondary | ICD-10-CM | POA: Diagnosis not present

## 2017-11-11 DIAGNOSIS — F329 Major depressive disorder, single episode, unspecified: Secondary | ICD-10-CM | POA: Diagnosis not present

## 2017-11-11 DIAGNOSIS — M4326 Fusion of spine, lumbar region: Secondary | ICD-10-CM | POA: Diagnosis not present

## 2017-11-15 DIAGNOSIS — F329 Major depressive disorder, single episode, unspecified: Secondary | ICD-10-CM | POA: Diagnosis not present

## 2017-11-15 DIAGNOSIS — M4326 Fusion of spine, lumbar region: Secondary | ICD-10-CM | POA: Diagnosis not present

## 2017-11-15 DIAGNOSIS — T84216D Breakdown (mechanical) of internal fixation device of vertebrae, subsequent encounter: Secondary | ICD-10-CM | POA: Diagnosis not present

## 2017-11-15 DIAGNOSIS — I10 Essential (primary) hypertension: Secondary | ICD-10-CM | POA: Diagnosis not present

## 2017-11-15 DIAGNOSIS — E119 Type 2 diabetes mellitus without complications: Secondary | ICD-10-CM | POA: Diagnosis not present

## 2017-11-15 DIAGNOSIS — I2510 Atherosclerotic heart disease of native coronary artery without angina pectoris: Secondary | ICD-10-CM | POA: Diagnosis not present

## 2017-11-16 DIAGNOSIS — T84216D Breakdown (mechanical) of internal fixation device of vertebrae, subsequent encounter: Secondary | ICD-10-CM | POA: Diagnosis not present

## 2017-11-16 DIAGNOSIS — M4326 Fusion of spine, lumbar region: Secondary | ICD-10-CM | POA: Diagnosis not present

## 2017-11-16 DIAGNOSIS — I2510 Atherosclerotic heart disease of native coronary artery without angina pectoris: Secondary | ICD-10-CM | POA: Diagnosis not present

## 2017-11-16 DIAGNOSIS — E119 Type 2 diabetes mellitus without complications: Secondary | ICD-10-CM | POA: Diagnosis not present

## 2017-11-16 DIAGNOSIS — F329 Major depressive disorder, single episode, unspecified: Secondary | ICD-10-CM | POA: Diagnosis not present

## 2017-11-16 DIAGNOSIS — I10 Essential (primary) hypertension: Secondary | ICD-10-CM | POA: Diagnosis not present

## 2017-11-17 ENCOUNTER — Other Ambulatory Visit: Payer: Self-pay

## 2017-11-17 ENCOUNTER — Encounter: Payer: Self-pay | Admitting: Hematology and Oncology

## 2017-11-17 ENCOUNTER — Inpatient Hospital Stay: Payer: Medicare Other

## 2017-11-17 ENCOUNTER — Inpatient Hospital Stay (HOSPITAL_BASED_OUTPATIENT_CLINIC_OR_DEPARTMENT_OTHER): Payer: Medicare Other | Admitting: Hematology and Oncology

## 2017-11-17 ENCOUNTER — Telehealth: Payer: Self-pay | Admitting: *Deleted

## 2017-11-17 VITALS — BP 135/63 | HR 68 | Temp 97.5°F | Resp 18 | Wt 184.5 lb

## 2017-11-17 DIAGNOSIS — D509 Iron deficiency anemia, unspecified: Secondary | ICD-10-CM

## 2017-11-17 DIAGNOSIS — E291 Testicular hypofunction: Secondary | ICD-10-CM

## 2017-11-17 DIAGNOSIS — R700 Elevated erythrocyte sedimentation rate: Secondary | ICD-10-CM | POA: Diagnosis not present

## 2017-11-17 DIAGNOSIS — K5900 Constipation, unspecified: Secondary | ICD-10-CM | POA: Diagnosis not present

## 2017-11-17 DIAGNOSIS — M069 Rheumatoid arthritis, unspecified: Secondary | ICD-10-CM | POA: Diagnosis not present

## 2017-11-17 DIAGNOSIS — D61818 Other pancytopenia: Secondary | ICD-10-CM | POA: Diagnosis not present

## 2017-11-17 LAB — CBC WITH DIFFERENTIAL/PLATELET
Basophils Absolute: 0 10*3/uL (ref 0–0.1)
Basophils Relative: 1 %
Eosinophils Absolute: 0.2 10*3/uL (ref 0–0.7)
Eosinophils Relative: 5 %
HCT: 25.1 % — ABNORMAL LOW (ref 40.0–52.0)
Hemoglobin: 8.2 g/dL — ABNORMAL LOW (ref 13.0–18.0)
Lymphocytes Relative: 18 %
Lymphs Abs: 0.6 10*3/uL — ABNORMAL LOW (ref 1.0–3.6)
MCH: 28.6 pg (ref 26.0–34.0)
MCHC: 32.8 g/dL (ref 32.0–36.0)
MCV: 87.2 fL (ref 80.0–100.0)
Monocytes Absolute: 0.4 10*3/uL (ref 0.2–1.0)
Monocytes Relative: 12 %
Neutro Abs: 2.2 10*3/uL (ref 1.4–6.5)
Neutrophils Relative %: 64 %
Platelets: 214 10*3/uL (ref 150–440)
RBC: 2.87 MIL/uL — ABNORMAL LOW (ref 4.40–5.90)
RDW: 16.6 % — ABNORMAL HIGH (ref 11.5–14.5)
WBC: 3.4 10*3/uL — ABNORMAL LOW (ref 3.8–10.6)

## 2017-11-17 LAB — SAMPLE TO BLOOD BANK

## 2017-11-17 MED ORDER — TESTOSTERONE CYPIONATE 200 MG/ML IM SOLN
200.0000 mg | Freq: Once | INTRAMUSCULAR | Status: AC
  Administered 2017-11-17: 200 mg via INTRAMUSCULAR

## 2017-11-17 NOTE — Progress Notes (Signed)
[facility] day:  11/17/2017  Chief Complaint: Bryan [name] is a 77 y.o. male with rheumatoid arthritis, iron deficiency anemia, and mild pancytopenia who is seen for 1 week assessment after recent hospitalization.  HPI:  The patient was last seen in the hematology clinic on  11/08/2017.  At that time, he had recently been discharged from the hospital for symptomatic anemia.  He had been transfused 2 units of blood.  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  Hemoglobin was 9.0.  Patient started back on exogenous testosterone on 11/03/2017.  He receives testosterone every 2 weeks.    Labs at last visit included:  Hematocrit of 27.2, hemoglobin 9.0, platelets 200,000, WBC 5800 with an [place] of 4100.  Ferritin was 86.  Iron saturation was 6% with a TIBC of 382.  Sed rate was 56 (0-20).  Cold agglutinins were negative.  Reticulocyte count was 1.2%  Symptomatically, he is doing "ok".  He is back at home.  Oral iron causes constipation.  He is not eating enough food.  He notes that his wife had surgery and is in rehab.   Past Medical History:  Diagnosis Date  . Abnormal CT scan    Asymmetric left rectal wall thickening   . Anemia   . Anxiety   . Arrhythmia   . Chronic lower back pain   . Complication of anesthesia    Memory loss 09/2015  . Coronary artery disease   . Depression   . GERD (gastroesophageal reflux disease)   . Glaucoma   . H/O thoracic aortic aneurysm repair   . Heart murmur   . History of being hospitalized    memory lose kidney funtion down blood pressure up  . History of blood transfusion    "think he had one when he had heart valve OR" (10/14/2016); "none since" (10/19/2017)  . History of chicken pox   . Hyperlipidemia   . Hypertension   . Lumbar stenosis   . Neuropathy   . Osteoarthritis    worse in feet and ankles  . Paroxysmal A-fib ([place])   . Poor short term memory    takes Aricept  . Rheumatoid arthritis  ([place])    "all over" (10/14/2016)  . Schizophrenia ([place])   . Seasonal allergies   . Valvular heart disease     Past Surgical History:  Procedure Laterality Date  . AORTIC VALVE REPLACEMENT  2007   [facility].  [place], [place]; "pig valve"  . APPENDECTOMY  2010  . BACK SURGERY    . CARDIAC VALVE REPLACEMENT    . CATARACT EXTRACTION W/ INTRAOCULAR LENS  IMPLANT, BILATERAL Bilateral 2012  . COLONOSCOPY WITH PROPOFOL N/A 09/24/2016   Procedure: COLONOSCOPY WITH PROPOFOL;  Surgeon: Jonathon Bellows, MD;  Location: [facility] ENDOSCOPY;  Service: Endoscopy;  Laterality: N/A;  . ESOPHAGOGASTRODUODENOSCOPY (EGD) WITH PROPOFOL N/A 09/24/2016   Procedure: ESOPHAGOGASTRODUODENOSCOPY (EGD) WITH PROPOFOL;  Surgeon: Jonathon Bellows, MD;  Location: [facility] ENDOSCOPY;  Service: Endoscopy;  Laterality: N/A;  . GIVENS CAPSULE STUDY N/A 09/24/2016   Procedure: GIVENS CAPSULE STUDY;  Surgeon: Jonathon Bellows, MD;  Location: [facility] ENDOSCOPY;  Service: Endoscopy;  Laterality: N/A;  . HAMMER TOE SURGERY Right 10/09/2015   Procedure: HAMMER TOE REPAIR WITH K-WIRE FIXATION RIGHT SECOND TOE;  Surgeon: Albertine Patricia, DPM;  Location: [place];  Service: Podiatry;  Laterality: Right;  WITH LOCAL  . JOINT REPLACEMENT    . LAMINECTOMY WITH POSTERIOR LATERAL ARTHRODESIS LEVEL 3  N/A 09/28/2017   Procedure: LUMBAR  POSTEROR  FUSION REVISION - LUMBAR ONE -TWO, LUMBAR TWO -THREE,THREE-FOUR ,BILATERAL ARTHRODESIS REMOVAL LUMBAR THREE HARDWARE;  Surgeon: Kary Kos, MD;  Location: [place];  Service: Neurosurgery;  Laterality: N/A;  . LAMINECTOMY WITH POSTERIOR LATERAL ARTHRODESIS LEVEL 3 N/A 10/20/2017   Procedure: Revision fusion and removal of hardware Lumbar one, Pedicle screw fixation thoracic ten-lumbar two, thoracic nine-ten laminotomy, Posterior Lumbar Arthrodesis thoracic ten-lumbar two ;  Surgeon: Kary Kos, MD;  Location: [place];  Service: Neurosurgery;  Laterality: N/A;  . LAPAROSCOPIC CHOLECYSTECTOMY  2010  . LUMBAR FUSION Right  06/08/2017   LUMBAR THREE-FOUR, LUMBAR FOUR-FIVE POSTEROLATERAL ARTHRODESIS WITH RIGHT LUMBAR FOUR-FIVE LAMINECTOMY/FORAMINOTOMY  . LUMBAR LAMINECTOMY/DECOMPRESSION MICRODISCECTOMY Right 03/08/2016   Procedure: Laminectomy and Foraminotomy - Lumbar Five-Sacral One Right;  Surgeon: Kary Kos, MD;  Location: [place];  Service: Neurosurgery;  Laterality: Right;  Right  . MULTIPLE TOOTH EXTRACTIONS     "3"  . POSTERIOR LUMBAR FUSION  10/14/2016   L5-S1  . REPLACEMENT TOTAL KNEE Left 2003  . THORACIC AORTIC ANEURYSM REPAIR  2010    Family History  Problem Relation Age of Onset  . Heart failure Mother   . Hypertension Mother   . Asthma Mother   . Heart attack Father 21       MI  . Hypertension Sister   . Prostate cancer Neg Hx   . Bladder Cancer Neg Hx   . Kidney cancer Neg Hx     Social History:  reports that he quit smoking about 36 years ago. His smoking use included cigarettes. He has a 32.00 pack-year smoking history. He has never used smokeless tobacco. He reports that he drinks alcohol. He reports that he does not use drugs.  He smoked 1 pack a day x 12 years.  He stopped smoking 30 years ago.  He drinks a glass of wine occasionally.  He is a Software engineer.  [place]  The patient is accompanied by his daughter today.  Allergies:  Allergies  Allergen Reactions  . Penicillins Hives, Swelling and Other (See Comments)    SWELLING REACTION UNSPECIFIED PATIENT HAS TAKEN AMOXICILLIN ON MED HX FROM [facility] PATIENT HAS HAD A PCN REACTION WITH IMMEDIATE RASH, FACIAL/TONGUE/THROAT SWELLING, SOB, OR LIGHTHEADEDNESS WITH HYPOTENSION:  #  #  #  YES  #  #  #   Has patient had a PCN reaction causing severe rash involving mucus membranes or skin necrosis: No Has patient had a PCN reaction that required hospitalization No Has patient had a PCN reaction occurring within the last 10 years: No  . Demerol [Meperidine] Hives and Nausea And Vomiting     Current Medications: Current Outpatient Medications  Medication Sig Dispense Refill  . amLODipine (NORVASC) 5 MG tablet TAKE ONE TABLET BY MOUTH EVERY DAY 90 tablet 1  . aspirin 325 MG EC tablet Take 325 mg by mouth daily at 2 PM.     . atorvastatin (LIPITOR) 40 MG tablet Take 1 tablet (40 mg total) by mouth daily. (Patient taking differently: Take 40 mg by mouth daily at 6 PM. ) 90 tablet 3  . cetirizine (ZYRTEC) 10 MG tablet Take 10 mg by mouth daily.    . cyclobenzaprine (FLEXERIL) 5 MG tablet Take 1 tablet (5 mg total) by mouth 3 (three) times daily as needed for muscle spasms. (Patient taking differently: Take 10 mg by mouth 3 (three) times daily as needed for  muscle spasms. ) 30 tablet 0  . donepezil (ARICEPT) 5 MG tablet Take 5 mg by mouth at bedtime.     . fenofibrate 160 MG tablet TAKE ONE TABLET BY MOUTH EVERY DAY (Patient taking differently: TAKE ONE TABLET BY MOUTH DAILY) 30 tablet 3  . FLUoxetine (PROZAC) 20 MG capsule TAKE 1 CAPSULE BY MOUTH EVERY DAY 90 capsule 1  . folic acid (FOLVITE) 1 MG tablet Take 1 mg by mouth 2 (two) times daily.    Marland Kitchen gabapentin (NEURONTIN) 400 MG capsule Take 400 mg by mouth 3 (three) times daily.     Marland Kitchen HYDROcodone-acetaminophen (NORCO) 10-325 MG tablet Take 1 tablet by mouth every 6 (six) hours as needed for moderate pain. 20 tablet 0  . hydrocortisone cream 1 % Apply 1 application topically daily as needed for itching.    Marland Kitchen lisinopril (PRINIVIL,ZESTRIL) 20 MG tablet TAKE ONE TABLET BY MOUTH EVERY EVENING 90 tablet 3  . mirabegron ER (MYRBETRIQ) 50 MG TB24 tablet Take 50 mg by mouth daily.    Marland Kitchen omeprazole (PRILOSEC) 40 MG capsule TAKE 1 CAPSULE BY MOUTH DAILY 30 capsule 3  . polyethylene glycol powder (GLYCOLAX/MIRALAX) powder USE 1 CAPFUL IN 4 TO 8 OUNCES OF LIQUID DAILY (Patient taking differently: USE 1 CAPFUL IN 4 TO 8 OUNCES OF LIQUID TWICE DAILY) 527 g 0  . tamsulosin (FLOMAX) 0.4 MG CAPS capsule TAKE 1 CAPSULE BY MOUTH DAILY 90 capsule 0  .  testosterone cypionate (DEPOTESTOSTERONE CYPIONATE) 200 MG/ML injection INJECT 73mL INTO THE MUSCLE EVERY 14 DAYS 10 mL 0  . traZODone (DESYREL) 100 MG tablet Take 100 mg by mouth at bedtime.    Marland Kitchen acetaminophen (TYLENOL) 325 MG tablet Take 650 mg by mouth 4 (four) times daily.    Marland Kitchen amoxicillin (AMOXIL) 500 MG capsule Take 2,000 mg by mouth once. 1 hour prior to dental appointment    . ferrous sulfate 325 (65 FE) MG tablet Take 1 tablet (325 mg total) by mouth 2 (two) times daily with a meal. (Patient not taking: Reported on 11/17/2017) 60 tablet 3  . lidocaine (LIDODERM) 5 % Place 1 patch onto the skin daily. Remove & Discard patch within 12 hours or as directed by MD    . senna-docusate (SENOKOT-S) 8.6-50 MG tablet Take 1 tablet by mouth 2 (two) times daily.     No current facility-administered medications for this visit.     Review of Systems:  GENERAL:  Feels"ok".  No fevers, sweats or weight loss. PERFORMANCE STATUS (ECOG):  2 HEENT:  No visual changes, runny nose, sore throat, mouth sores or tenderness. Lungs: No shortness of breath or cough.  No hemoptysis. Cardiac:  No chest pain, palpitations, orthopnea, or PND. GI:  Not eating enough.  No nausea, vomiting, diarrhea, constipation, melena or hematochezia. GU:  No urgency, frequency, dysuria, or hematuria. Musculoskeletal:  S/p back surgery on 10/20/2017.  Rheumatoid arthritis.  No muscle tenderness. Extremities:  No pain or swelling. Skin:  No rashes or skin changes. Neuro:  No headache, numbness or weakness, balance or coordination issues.  Unsteady on feet. Endocrine:  No diabetes, thyroid issues, hot flashes or night sweats. Psych:  No mood changes, depression or anxiety. Pain:  Back pain pain (4 out of 10). Review of systems:  All other systems reviewed and found to be negative.   Physical Exam: Blood pressure 135/63, pulse 68, temperature (!) 97.5 F (36.4 C), temperature source Tympanic, resp. rate 18, weight 184 lb 8 oz  (83.7 kg), SpO2  98 %. GENERAL:  Well developed, well nourished, sitting comfortably in the exam room in no acute distress. MENTAL STATUS:  Alert and oriented to person, place and time. HEAD:  L[place] Punt and goatee.  Normocephalic, atraumatic, face symmetric, no Cushingoid features. EYES:  Hazel eyes.  No conjunctivitis or scleral icterus. NEUROLOGICAL: Unremarkable. PSYCH:  Appropriate.    Appointment on 11/17/2017  Component Date Value Ref Range Status  . Blood Bank Specimen 11/17/2017 SAMPLE AVAILABLE FOR TESTING   Final  . Sample Expiration 11/17/2017    Final                   Value:11/20/2017 Performed at [facility], [address], [place], [place] [address]   . WBC 11/17/2017 3.4* 3.8 - 10.6 K/uL Final  . RBC 11/17/2017 2.87* 4.40 - 5.90 MIL/uL Final  . Hemoglobin 11/17/2017 8.2* 13.0 - 18.0 g/dL Final  . HCT 11/17/2017 25.1* 40.0 - 52.0 % Final  . MCV 11/17/2017 87.2  80.0 - 100.0 fL Final  . MCH 11/17/2017 28.6  26.0 - 34.0 pg Final  . MCHC 11/17/2017 32.8  32.0 - 36.0 g/dL Final  . RDW 11/17/2017 16.6* 11.5 - 14.5 % Final  . Platelets 11/17/2017 214  150 - 440 K/uL Final  . Neutrophils Relative % 11/17/2017 64  % Final  . Neutro Abs 11/17/2017 2.2  1.4 - 6.5 K/uL Final  . Lymphocytes Relative 11/17/2017 18  % Final  . Lymphs Abs 11/17/2017 0.6* 1.0 - 3.6 K/uL Final  . Monocytes Relative 11/17/2017 12  % Final  . Monocytes Absolute 11/17/2017 0.4  0.2 - 1.0 K/uL Final  . Eosinophils Relative 11/17/2017 5  % Final  . Eosinophils Absolute 11/17/2017 0.2  0 - 0.7 K/uL Final  . Basophils Relative 11/17/2017 1  % Final  . Basophils Absolute 11/17/2017 0.0  0 - 0.1 K/uL Final   Performed at [facility], [address], [place], [place] [address]    Assessment:  Bryan [name] is a 77 y.o. male with rheumatoid arthritis and progressive anemia over the past 2 years.  He has been on Plaquenil and hydralazine which can cause anemia.  Plaquenil was  discontinued on 08/26/2016.  He denies any exposure to radiation or toxins.  He denies any prior history of hepatitis, prior transfusions or HIV risk factors.  He denies any herbal products.  Diet is good.  EGD on 09/24/2016 revealed patchy candidiasis in the entire esophagus.  There were two non-bleeding angioectasias in the duodenum treated with argon plasma coagulation.  Gastritis was biopsied.  Pathology revealed mild chronic gastritis negative for H pylori, dysplasia and malignancy.  Colonoscopy on 09/24/2016 revealed diverticulosis in the entire colon, one 3 mm polyp in the cecum, and non-bleeding internal hemorrhoids.  Pathology from the cecal polyp revealed a tubular adenoma negative for high grade dysplasia and malignancy.    He denies any melena or hematochezia. He denies any hematuria.  CBC on 06/02/2016 revealed a hematocrit of 29.5, hemoglobin 10.0, MCV 92.3, platelets 155,000, and WBC 4200.  Stool was guaiac negative x 2 in 06/2016.  Labs on 06/04/2016 revealed a ferritin of 124, iron saturation 22% and TIBC of 370.  B12 was 883 on 06/18/2014.  Work-up on 07/19/2016 revealed a hematocrit of 31.7, hemoglobin 10.6, MCV 91, platelets 143,000, white count 3000 with an ANC of 1800.  Absolute lymphocyte count was 700 (low). Creatinine was 1.57.  LDH was 269 (98-192).  Normal labs included:  uric  acid, folate, Coombs, SPEP, and copper.  Iron studies included a saturation of 11% (low) and a TIBC of 454 (high) c/w iron deficiency anemia.  Sed rate was 19.  Retic was 0.9% (low).  He is on oral iron with vitamin C.  Chest, abdomen, and pelvic CTon 08/09/2016 revealed no acute findings or clear evidence of malignancy in the chest, abdomen or pelvis. There was asymmetric left rectal wall thickeningpossibly secondary to volume averaging with the prostate gland.  Bone marrow aspirate and biopsy on 11/04/2016 revealed a normocellular marrow (20%) with trilineage hematopoiesis and maturation.  There was  mild megakaryocytic atypia.  Storage iron was present.  There were rare ringed sideroblasts.  Correlation with cytogenetics is recommended to exclude a low grade myelodysplastic syndrome with atypia. Cytogenetics and FISH were normal (46, XY).  He was admitted to [facility] on 10/14/2016 - 10/18/2016 for spinal stenosis of the lumbar region.  He underwent redo decompressive lumbar laminotomy L5-S1 with radical foraminotomy the L5 and S1 nerve root with complete medial facetectomies. He underwent back surgery (revision fusion removal hardware and pedicle screw fixation) on 10/20/2017.  He was admitted to [facility] from 10/28/2017 - 10/31/2017 with symptomatic anemia.  He received 2 units of PRBCs.  Hemoglobin improved from 6.2 to 8.1.  Hospital work-up revealed the following normal labs: ferritin (65), B12 (401), TSH, SPEP, haptoglobin.  Testosterone was < 3 (low).  Iron saturation was 7% with a TIBC of 317.  LDH was 205 (98-192).  Retic was 2.6%.  Creatinine was 1.37 (CrCl 47.3 ml/min).  Peripheral smear revealed leukopenia with normal WBC morphology. Normocytic anemia with polychromasia, anisocytosis and rouleaux formation.   Work-up on 11/08/2017 revealed a hematocrit of 27.2, hemoglobin 9.0, platelets 200,000, WBC 5800 with an [place] of 4100.  Ferritin was 86.  Iron saturation was 6% with a TIBC of 382.  Sed rate was 56 (0-20).  Cold agglutinins were negative.  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  He has treated with Septra then switched to doxycycline.  He underwent back surgery on 10/20/2017.  Symptomatically, he feels "ok".  Diet is modest.  Oral iron causes constipation.  Plan: 1.  Labs today:  CBC with diff, hold tube. 2.  Review work-up.  Etiology of anemia felt secondary to iron deficiency and low testosterone.  Iron saturation low. Ferritin falsely elevated secondary to elevated sed rate.  Discuss oral versus IV iron.  Potential side effects  reviewed.  Patient would like to try. Goal ferritin 100 with normal iron saturation. 3.  Continue prescribed testosterone.  4.  Schedule Venofer 200 mg IV weekly x 2 5.  If hemoglobin does not improve with testosterone and iron supplementation, discuss repeat bone marrow given minor dysplastic changes with marrow in 2018 (? early myelodysplasia). 6.  RTC in 1 week for labs (CBC with diff). Coordinate with Venofer appt.  7.  RTC in 1 month for MD assessment and labs (CBC with diff, BMP, ferritin) +/- Venofer   Honor Loh, NP  11/17/2017, 4:20 PM    I saw and evaluated the patient, participating in the key portions of the service and reviewing pertinent diagnostic studies and records.  I reviewed the nurse practitioner's note and agree with the findings and the plan.  The assessment and plan were discussed with the patient.  Several questions were asked by the patient and answered.   Nolon Stalls, MD 11/17/2017,4:20 PM

## 2017-11-17 NOTE — Telephone Encounter (Signed)
Patient brought his own Testosterone to clinic today and asked that it be injected here at his clinic visit. Testosterone Cypiona 200 mg/ml  IM in left gluteous. Patient tolerated well.  Injection site covered with gauze and bandaid.  Patient discharged home.

## 2017-11-17 NOTE — Progress Notes (Signed)
Patient here for follow up. No concerns voiced.  °

## 2017-11-18 DIAGNOSIS — T84216D Breakdown (mechanical) of internal fixation device of vertebrae, subsequent encounter: Secondary | ICD-10-CM | POA: Diagnosis not present

## 2017-11-18 DIAGNOSIS — M4326 Fusion of spine, lumbar region: Secondary | ICD-10-CM | POA: Diagnosis not present

## 2017-11-18 DIAGNOSIS — I10 Essential (primary) hypertension: Secondary | ICD-10-CM | POA: Diagnosis not present

## 2017-11-18 DIAGNOSIS — F329 Major depressive disorder, single episode, unspecified: Secondary | ICD-10-CM | POA: Diagnosis not present

## 2017-11-18 DIAGNOSIS — E119 Type 2 diabetes mellitus without complications: Secondary | ICD-10-CM | POA: Diagnosis not present

## 2017-11-18 DIAGNOSIS — I2510 Atherosclerotic heart disease of native coronary artery without angina pectoris: Secondary | ICD-10-CM | POA: Diagnosis not present

## 2017-11-18 NOTE — Progress Notes (Signed)
Patient received home medication for Testosterone during his visit to clinic. Pharmacy will verify; however medications already given on Medication administration record on 6/27.  Larene Beach, PharmD

## 2017-11-21 DIAGNOSIS — M4326 Fusion of spine, lumbar region: Secondary | ICD-10-CM | POA: Diagnosis not present

## 2017-11-21 DIAGNOSIS — I10 Essential (primary) hypertension: Secondary | ICD-10-CM | POA: Diagnosis not present

## 2017-11-21 DIAGNOSIS — E119 Type 2 diabetes mellitus without complications: Secondary | ICD-10-CM | POA: Diagnosis not present

## 2017-11-21 DIAGNOSIS — T84216D Breakdown (mechanical) of internal fixation device of vertebrae, subsequent encounter: Secondary | ICD-10-CM | POA: Diagnosis not present

## 2017-11-21 DIAGNOSIS — I2510 Atherosclerotic heart disease of native coronary artery without angina pectoris: Secondary | ICD-10-CM | POA: Diagnosis not present

## 2017-11-21 DIAGNOSIS — F329 Major depressive disorder, single episode, unspecified: Secondary | ICD-10-CM | POA: Diagnosis not present

## 2017-11-22 DIAGNOSIS — M5416 Radiculopathy, lumbar region: Secondary | ICD-10-CM | POA: Diagnosis not present

## 2017-11-22 DIAGNOSIS — S32009K Unspecified fracture of unspecified lumbar vertebra, subsequent encounter for fracture with nonunion: Secondary | ICD-10-CM | POA: Diagnosis not present

## 2017-11-22 DIAGNOSIS — Z6825 Body mass index (BMI) 25.0-25.9, adult: Secondary | ICD-10-CM | POA: Diagnosis not present

## 2017-11-22 DIAGNOSIS — S32040A Wedge compression fracture of fourth lumbar vertebra, initial encounter for closed fracture: Secondary | ICD-10-CM | POA: Diagnosis not present

## 2017-11-25 ENCOUNTER — Other Ambulatory Visit: Payer: Self-pay | Admitting: Family Medicine

## 2017-11-25 ENCOUNTER — Inpatient Hospital Stay: Payer: Medicare Other

## 2017-11-25 ENCOUNTER — Inpatient Hospital Stay: Payer: Medicare Other | Attending: Hematology and Oncology

## 2017-11-25 DIAGNOSIS — Z79899 Other long term (current) drug therapy: Secondary | ICD-10-CM | POA: Diagnosis not present

## 2017-11-25 DIAGNOSIS — M069 Rheumatoid arthritis, unspecified: Secondary | ICD-10-CM | POA: Insufficient documentation

## 2017-11-25 DIAGNOSIS — K5900 Constipation, unspecified: Secondary | ICD-10-CM | POA: Insufficient documentation

## 2017-11-25 DIAGNOSIS — D61818 Other pancytopenia: Secondary | ICD-10-CM | POA: Diagnosis not present

## 2017-11-25 DIAGNOSIS — D72819 Decreased white blood cell count, unspecified: Secondary | ICD-10-CM | POA: Diagnosis not present

## 2017-11-25 DIAGNOSIS — D509 Iron deficiency anemia, unspecified: Secondary | ICD-10-CM | POA: Diagnosis not present

## 2017-11-25 DIAGNOSIS — E291 Testicular hypofunction: Secondary | ICD-10-CM | POA: Insufficient documentation

## 2017-11-25 DIAGNOSIS — R700 Elevated erythrocyte sedimentation rate: Secondary | ICD-10-CM | POA: Insufficient documentation

## 2017-11-25 DIAGNOSIS — I6523 Occlusion and stenosis of bilateral carotid arteries: Secondary | ICD-10-CM | POA: Insufficient documentation

## 2017-11-25 LAB — CBC WITH DIFFERENTIAL/PLATELET
Basophils Absolute: 0 10*3/uL (ref 0–0.1)
Basophils Relative: 1 %
Eosinophils Absolute: 0 10*3/uL (ref 0–0.7)
Eosinophils Relative: 0 %
HCT: 24.7 % — ABNORMAL LOW (ref 40.0–52.0)
Hemoglobin: 8.1 g/dL — ABNORMAL LOW (ref 13.0–18.0)
Lymphocytes Relative: 14 %
Lymphs Abs: 0.6 10*3/uL — ABNORMAL LOW (ref 1.0–3.6)
MCH: 28.6 pg (ref 26.0–34.0)
MCHC: 32.9 g/dL (ref 32.0–36.0)
MCV: 86.9 fL (ref 80.0–100.0)
Monocytes Absolute: 0.3 10*3/uL (ref 0.2–1.0)
Monocytes Relative: 8 %
Neutro Abs: 3.4 10*3/uL (ref 1.4–6.5)
Neutrophils Relative %: 77 %
Platelets: 188 10*3/uL (ref 150–440)
RBC: 2.85 MIL/uL — ABNORMAL LOW (ref 4.40–5.90)
RDW: 16.9 % — ABNORMAL HIGH (ref 11.5–14.5)
WBC: 4.4 10*3/uL (ref 3.8–10.6)

## 2017-11-25 MED ORDER — IRON SUCROSE 20 MG/ML IV SOLN
200.0000 mg | Freq: Once | INTRAVENOUS | Status: AC
  Administered 2017-11-25: 200 mg via INTRAVENOUS
  Filled 2017-11-25: qty 10

## 2017-11-25 MED ORDER — SODIUM CHLORIDE 0.9 % IV SOLN
Freq: Once | INTRAVENOUS | Status: AC
  Administered 2017-11-25: 15:00:00 via INTRAVENOUS
  Filled 2017-11-25: qty 1000

## 2017-11-28 DIAGNOSIS — I2510 Atherosclerotic heart disease of native coronary artery without angina pectoris: Secondary | ICD-10-CM | POA: Diagnosis not present

## 2017-11-28 DIAGNOSIS — F329 Major depressive disorder, single episode, unspecified: Secondary | ICD-10-CM | POA: Diagnosis not present

## 2017-11-28 DIAGNOSIS — E119 Type 2 diabetes mellitus without complications: Secondary | ICD-10-CM | POA: Diagnosis not present

## 2017-11-28 DIAGNOSIS — T84216D Breakdown (mechanical) of internal fixation device of vertebrae, subsequent encounter: Secondary | ICD-10-CM | POA: Diagnosis not present

## 2017-11-28 DIAGNOSIS — I10 Essential (primary) hypertension: Secondary | ICD-10-CM | POA: Diagnosis not present

## 2017-11-28 DIAGNOSIS — M4326 Fusion of spine, lumbar region: Secondary | ICD-10-CM | POA: Diagnosis not present

## 2017-11-29 ENCOUNTER — Other Ambulatory Visit: Payer: Self-pay | Admitting: Hematology and Oncology

## 2017-12-01 ENCOUNTER — Inpatient Hospital Stay: Payer: Medicare Other | Admitting: Family Medicine

## 2017-12-02 ENCOUNTER — Other Ambulatory Visit: Payer: Self-pay | Admitting: Neurosurgery

## 2017-12-02 ENCOUNTER — Other Ambulatory Visit: Payer: Self-pay | Admitting: Gastroenterology

## 2017-12-02 ENCOUNTER — Inpatient Hospital Stay: Payer: Medicare Other

## 2017-12-02 VITALS — BP 108/62 | HR 69 | Temp 96.5°F | Resp 18

## 2017-12-02 DIAGNOSIS — D61818 Other pancytopenia: Secondary | ICD-10-CM | POA: Diagnosis not present

## 2017-12-02 DIAGNOSIS — D509 Iron deficiency anemia, unspecified: Secondary | ICD-10-CM

## 2017-12-02 DIAGNOSIS — I10 Essential (primary) hypertension: Secondary | ICD-10-CM | POA: Diagnosis not present

## 2017-12-02 DIAGNOSIS — E119 Type 2 diabetes mellitus without complications: Secondary | ICD-10-CM | POA: Diagnosis not present

## 2017-12-02 DIAGNOSIS — I2510 Atherosclerotic heart disease of native coronary artery without angina pectoris: Secondary | ICD-10-CM | POA: Diagnosis not present

## 2017-12-02 DIAGNOSIS — K5900 Constipation, unspecified: Secondary | ICD-10-CM | POA: Diagnosis not present

## 2017-12-02 DIAGNOSIS — T84216D Breakdown (mechanical) of internal fixation device of vertebrae, subsequent encounter: Secondary | ICD-10-CM | POA: Diagnosis not present

## 2017-12-02 DIAGNOSIS — K219 Gastro-esophageal reflux disease without esophagitis: Secondary | ICD-10-CM

## 2017-12-02 DIAGNOSIS — M5440 Lumbago with sciatica, unspecified side: Secondary | ICD-10-CM

## 2017-12-02 DIAGNOSIS — M4326 Fusion of spine, lumbar region: Secondary | ICD-10-CM | POA: Diagnosis not present

## 2017-12-02 DIAGNOSIS — F329 Major depressive disorder, single episode, unspecified: Secondary | ICD-10-CM | POA: Diagnosis not present

## 2017-12-02 DIAGNOSIS — M069 Rheumatoid arthritis, unspecified: Secondary | ICD-10-CM | POA: Diagnosis not present

## 2017-12-02 DIAGNOSIS — E291 Testicular hypofunction: Secondary | ICD-10-CM | POA: Diagnosis not present

## 2017-12-02 DIAGNOSIS — R700 Elevated erythrocyte sedimentation rate: Secondary | ICD-10-CM | POA: Diagnosis not present

## 2017-12-02 MED ORDER — IRON SUCROSE 20 MG/ML IV SOLN
200.0000 mg | Freq: Once | INTRAVENOUS | Status: AC
  Administered 2017-12-02: 200 mg via INTRAVENOUS
  Filled 2017-12-02: qty 10

## 2017-12-02 MED ORDER — SODIUM CHLORIDE 0.9 % IV SOLN
Freq: Once | INTRAVENOUS | Status: AC
  Administered 2017-12-02: 14:00:00 via INTRAVENOUS
  Filled 2017-12-02: qty 1000

## 2017-12-02 NOTE — Progress Notes (Signed)
Per Honor Loh NP, Venofer only, no B12.

## 2017-12-05 ENCOUNTER — Emergency Department (HOSPITAL_COMMUNITY): Payer: Medicare Other

## 2017-12-05 ENCOUNTER — Encounter (HOSPITAL_COMMUNITY): Payer: Self-pay | Admitting: Emergency Medicine

## 2017-12-05 ENCOUNTER — Observation Stay (HOSPITAL_COMMUNITY): Payer: Medicare Other

## 2017-12-05 ENCOUNTER — Observation Stay (HOSPITAL_COMMUNITY)
Admission: EM | Admit: 2017-12-05 | Discharge: 2017-12-07 | Disposition: A | Discharge status: Home / Self Care | Payer: Medicare Other | Attending: Neurosurgery | Admitting: Neurosurgery

## 2017-12-05 ENCOUNTER — Other Ambulatory Visit: Payer: Self-pay

## 2017-12-05 DIAGNOSIS — F419 Anxiety disorder, unspecified: Secondary | ICD-10-CM | POA: Insufficient documentation

## 2017-12-05 DIAGNOSIS — Z880 Allergy status to penicillin: Secondary | ICD-10-CM | POA: Diagnosis not present

## 2017-12-05 DIAGNOSIS — Z87891 Personal history of nicotine dependence: Secondary | ICD-10-CM | POA: Diagnosis not present

## 2017-12-05 DIAGNOSIS — Z96652 Presence of left artificial knee joint: Secondary | ICD-10-CM | POA: Diagnosis not present

## 2017-12-05 DIAGNOSIS — Z981 Arthrodesis status: Secondary | ICD-10-CM | POA: Insufficient documentation

## 2017-12-05 DIAGNOSIS — M4804 Spinal stenosis, thoracic region: Secondary | ICD-10-CM | POA: Diagnosis not present

## 2017-12-05 DIAGNOSIS — M545 Low back pain, unspecified: Secondary | ICD-10-CM | POA: Diagnosis present

## 2017-12-05 DIAGNOSIS — F209 Schizophrenia, unspecified: Secondary | ICD-10-CM | POA: Diagnosis not present

## 2017-12-05 DIAGNOSIS — F329 Major depressive disorder, single episode, unspecified: Secondary | ICD-10-CM | POA: Insufficient documentation

## 2017-12-05 DIAGNOSIS — Z885 Allergy status to narcotic agent status: Secondary | ICD-10-CM | POA: Diagnosis not present

## 2017-12-05 DIAGNOSIS — K5900 Constipation, unspecified: Secondary | ICD-10-CM | POA: Diagnosis not present

## 2017-12-05 DIAGNOSIS — I1310 Hypertensive heart and chronic kidney disease without heart failure, with stage 1 through stage 4 chronic kidney disease, or unspecified chronic kidney disease: Secondary | ICD-10-CM | POA: Insufficient documentation

## 2017-12-05 DIAGNOSIS — I480 Paroxysmal atrial fibrillation: Secondary | ICD-10-CM | POA: Insufficient documentation

## 2017-12-05 DIAGNOSIS — Z79899 Other long term (current) drug therapy: Secondary | ICD-10-CM | POA: Insufficient documentation

## 2017-12-05 DIAGNOSIS — M62838 Other muscle spasm: Secondary | ICD-10-CM | POA: Insufficient documentation

## 2017-12-05 DIAGNOSIS — Z952 Presence of prosthetic heart valve: Secondary | ICD-10-CM | POA: Insufficient documentation

## 2017-12-05 DIAGNOSIS — E785 Hyperlipidemia, unspecified: Secondary | ICD-10-CM | POA: Diagnosis not present

## 2017-12-05 DIAGNOSIS — D649 Anemia, unspecified: Secondary | ICD-10-CM | POA: Diagnosis present

## 2017-12-05 DIAGNOSIS — S32012A Unstable burst fracture of first lumbar vertebra, initial encounter for closed fracture: Secondary | ICD-10-CM | POA: Diagnosis not present

## 2017-12-05 DIAGNOSIS — K219 Gastro-esophageal reflux disease without esophagitis: Secondary | ICD-10-CM | POA: Diagnosis not present

## 2017-12-05 DIAGNOSIS — R918 Other nonspecific abnormal finding of lung field: Secondary | ICD-10-CM | POA: Diagnosis present

## 2017-12-05 DIAGNOSIS — G629 Polyneuropathy, unspecified: Secondary | ICD-10-CM | POA: Insufficient documentation

## 2017-12-05 DIAGNOSIS — M549 Dorsalgia, unspecified: Secondary | ICD-10-CM

## 2017-12-05 DIAGNOSIS — M62830 Muscle spasm of back: Secondary | ICD-10-CM | POA: Diagnosis not present

## 2017-12-05 DIAGNOSIS — N189 Chronic kidney disease, unspecified: Secondary | ICD-10-CM | POA: Diagnosis present

## 2017-12-05 DIAGNOSIS — M1990 Unspecified osteoarthritis, unspecified site: Secondary | ICD-10-CM | POA: Diagnosis not present

## 2017-12-05 DIAGNOSIS — M546 Pain in thoracic spine: Secondary | ICD-10-CM | POA: Diagnosis not present

## 2017-12-05 LAB — CBC WITH DIFFERENTIAL/PLATELET
Abs Immature Granulocytes: 0 10*3/uL (ref 0.0–0.1)
Basophils Absolute: 0 10*3/uL (ref 0.0–0.1)
Basophils Relative: 0 %
Eosinophils Absolute: 0.1 10*3/uL (ref 0.0–0.7)
Eosinophils Relative: 3 %
HCT: 25 % — ABNORMAL LOW (ref 39.0–52.0)
Hemoglobin: 7.5 g/dL — ABNORMAL LOW (ref 13.0–17.0)
Immature Granulocytes: 1 %
Lymphocytes Relative: 15 %
Lymphs Abs: 0.6 10*3/uL — ABNORMAL LOW (ref 0.7–4.0)
MCH: 27.9 pg (ref 26.0–34.0)
MCHC: 30 g/dL (ref 30.0–36.0)
MCV: 92.9 fL (ref 78.0–100.0)
Monocytes Absolute: 0.5 10*3/uL (ref 0.1–1.0)
Monocytes Relative: 12 %
Neutro Abs: 2.9 10*3/uL (ref 1.7–7.7)
Neutrophils Relative %: 69 %
Platelets: 164 10*3/uL (ref 150–400)
RBC: 2.69 MIL/uL — ABNORMAL LOW (ref 4.22–5.81)
RDW: 16.9 % — ABNORMAL HIGH (ref 11.5–15.5)
WBC: 4.1 10*3/uL (ref 4.0–10.5)

## 2017-12-05 LAB — BASIC METABOLIC PANEL
Anion gap: 8 (ref 5–15)
BUN: 24 mg/dL — ABNORMAL HIGH (ref 8–23)
CO2: 24 mmol/L (ref 22–32)
Calcium: 8.8 mg/dL — ABNORMAL LOW (ref 8.9–10.3)
Chloride: 107 mmol/L (ref 98–111)
Creatinine, Ser: 1.34 mg/dL — ABNORMAL HIGH (ref 0.61–1.24)
GFR calc Af Amer: 56 mL/min — ABNORMAL LOW (ref >60)
GFR calc non Af Amer: 49 mL/min — ABNORMAL LOW (ref >60)
Glucose, Bld: 91 mg/dL (ref 70–99)
Potassium: 4.2 mmol/L (ref 3.5–5.1)
Sodium: 139 mmol/L (ref 135–145)

## 2017-12-05 LAB — SEDIMENTATION RATE: Sed Rate: 64 mm/hr — ABNORMAL HIGH (ref 0–16)

## 2017-12-05 LAB — C-REACTIVE PROTEIN: CRP: 8.8 mg/dL — ABNORMAL HIGH (ref <1.0)

## 2017-12-05 MED ORDER — LACTATED RINGERS IV BOLUS
1000.0000 mL | Freq: Once | INTRAVENOUS | Status: AC
  Administered 2017-12-05: 1000 mL via INTRAVENOUS

## 2017-12-05 MED ORDER — ONDANSETRON HCL 4 MG PO TABS
4.0000 mg | ORAL_TABLET | Freq: Four times a day (QID) | ORAL | Status: DC | PRN
Start: 2017-12-05 — End: 2017-12-07

## 2017-12-05 MED ORDER — GADOBENATE DIMEGLUMINE 529 MG/ML IV SOLN
16.0000 mL | Freq: Once | INTRAVENOUS | Status: AC
  Administered 2017-12-05: 16 mL via INTRAVENOUS

## 2017-12-05 MED ORDER — GABAPENTIN 400 MG PO CAPS
400.0000 mg | ORAL_CAPSULE | Freq: Three times a day (TID) | ORAL | Status: DC
  Administered 2017-12-05 – 2017-12-07 (×6): 400 mg via ORAL
  Filled 2017-12-05 (×6): qty 1

## 2017-12-05 MED ORDER — OXYCODONE HCL 5 MG PO TABS
5.0000 mg | ORAL_TABLET | ORAL | Status: DC | PRN
  Administered 2017-12-05 – 2017-12-07 (×4): 5 mg via ORAL
  Filled 2017-12-05 (×4): qty 1

## 2017-12-05 MED ORDER — DONEPEZIL HCL 5 MG PO TABS
5.0000 mg | ORAL_TABLET | Freq: Every day | ORAL | Status: DC
  Administered 2017-12-05 – 2017-12-06 (×2): 5 mg via ORAL
  Filled 2017-12-05 (×2): qty 1

## 2017-12-05 MED ORDER — KETOROLAC TROMETHAMINE 30 MG/ML IJ SOLN
30.0000 mg | Freq: Four times a day (QID) | INTRAMUSCULAR | Status: DC | PRN
  Filled 2017-12-05: qty 1

## 2017-12-05 MED ORDER — FOLIC ACID 1 MG PO TABS
1.0000 mg | ORAL_TABLET | Freq: Two times a day (BID) | ORAL | Status: DC
  Administered 2017-12-05 – 2017-12-07 (×4): 1 mg via ORAL
  Filled 2017-12-05 (×4): qty 1

## 2017-12-05 MED ORDER — LIDOCAINE 5 % EX PTCH: 1.0000 | MEDICATED_PATCH | Freq: Every day | CUTANEOUS | Status: DC

## 2017-12-05 MED ORDER — POLYETHYLENE GLYCOL 3350 17 G PO PACK: 17.0000 g | PACK | Freq: Every day | ORAL | Status: DC | PRN

## 2017-12-05 MED ORDER — PANTOPRAZOLE SODIUM 40 MG PO TBEC
40.0000 mg | DELAYED_RELEASE_TABLET | Freq: Every day | ORAL | Status: DC
  Administered 2017-12-06: 40 mg via ORAL
  Filled 2017-12-05 (×2): qty 1

## 2017-12-05 MED ORDER — DOCUSATE SODIUM 100 MG PO CAPS
100.0000 mg | ORAL_CAPSULE | Freq: Two times a day (BID) | ORAL | Status: DC
  Administered 2017-12-05 – 2017-12-06 (×3): 100 mg via ORAL
  Filled 2017-12-05 (×4): qty 1

## 2017-12-05 MED ORDER — ATORVASTATIN CALCIUM 40 MG PO TABS
40.0000 mg | ORAL_TABLET | Freq: Every day | ORAL | Status: DC
  Administered 2017-12-05 – 2017-12-06 (×2): 40 mg via ORAL
  Filled 2017-12-05 (×3): qty 1

## 2017-12-05 MED ORDER — HYDROMORPHONE HCL 2 MG PO TABS: 4.0000 mg | ORAL_TABLET | Freq: Four times a day (QID) | ORAL | Status: DC | PRN

## 2017-12-05 MED ORDER — ACETAMINOPHEN 650 MG RE SUPP: 650.0000 mg | Freq: Four times a day (QID) | RECTAL | Status: DC | PRN

## 2017-12-05 MED ORDER — ONDANSETRON HCL 4 MG/2ML IJ SOLN
4.0000 mg | Freq: Four times a day (QID) | INTRAMUSCULAR | Status: DC | PRN
  Administered 2017-12-05: 4 mg via INTRAVENOUS
  Filled 2017-12-05: qty 2

## 2017-12-05 MED ORDER — ACETAMINOPHEN 325 MG PO TABS: 650.0000 mg | ORAL_TABLET | Freq: Four times a day (QID) | ORAL | Status: DC | PRN

## 2017-12-05 MED ORDER — TAMSULOSIN HCL 0.4 MG PO CAPS
0.4000 mg | ORAL_CAPSULE | Freq: Every day | ORAL | Status: DC
  Administered 2017-12-06: 0.4 mg via ORAL
  Filled 2017-12-05 (×2): qty 1

## 2017-12-05 MED ORDER — FENTANYL CITRATE (PF) 100 MCG/2ML IJ SOLN
100.0000 ug | Freq: Once | INTRAMUSCULAR | Status: AC
  Administered 2017-12-05: 100 ug via INTRAVENOUS
  Filled 2017-12-05: qty 2

## 2017-12-05 MED ORDER — CYCLOBENZAPRINE HCL 10 MG PO TABS: 5.0000 mg | ORAL_TABLET | Freq: Three times a day (TID) | ORAL | Status: DC | PRN

## 2017-12-05 MED ORDER — AMLODIPINE BESYLATE 5 MG PO TABS
5.0000 mg | ORAL_TABLET | Freq: Every day | ORAL | Status: DC
  Administered 2017-12-06 – 2017-12-07 (×2): 5 mg via ORAL
  Filled 2017-12-05 (×2): qty 1

## 2017-12-05 MED ORDER — FERROUS SULFATE 325 (65 FE) MG PO TABS: 325.0000 mg | ORAL_TABLET | Freq: Two times a day (BID) | ORAL | Status: DC

## 2017-12-05 MED ORDER — DIAZEPAM 5 MG PO TABS
5.0000 mg | ORAL_TABLET | Freq: Once | ORAL | Status: AC
  Administered 2017-12-05: 5 mg via ORAL
  Filled 2017-12-05: qty 1

## 2017-12-05 MED ORDER — CYCLOBENZAPRINE HCL 10 MG PO TABS
10.0000 mg | ORAL_TABLET | Freq: Two times a day (BID) | ORAL | Status: DC | PRN
  Administered 2017-12-05 – 2017-12-07 (×3): 10 mg via ORAL
  Filled 2017-12-05 (×3): qty 1

## 2017-12-05 MED ORDER — ACETAMINOPHEN 325 MG PO TABS: 650.0000 mg | ORAL_TABLET | Freq: Four times a day (QID) | ORAL | Status: DC

## 2017-12-05 MED ORDER — MIRABEGRON ER 50 MG PO TB24
50.0000 mg | ORAL_TABLET | Freq: Every day | ORAL | Status: DC
  Administered 2017-12-06 – 2017-12-07 (×2): 50 mg via ORAL
  Filled 2017-12-05 (×2): qty 1

## 2017-12-05 MED ORDER — LISINOPRIL 20 MG PO TABS
20.0000 mg | ORAL_TABLET | Freq: Every evening | ORAL | Status: DC
  Administered 2017-12-06: 20 mg via ORAL
  Filled 2017-12-05: qty 1

## 2017-12-05 MED ORDER — SENNA 8.6 MG PO TABS
1.0000 | ORAL_TABLET | Freq: Two times a day (BID) | ORAL | Status: DC
  Administered 2017-12-05 – 2017-12-06 (×3): 8.6 mg via ORAL
  Filled 2017-12-05 (×4): qty 1

## 2017-12-05 MED ORDER — FENOFIBRATE 160 MG PO TABS
160.0000 mg | ORAL_TABLET | Freq: Every day | ORAL | Status: DC
  Administered 2017-12-06: 160 mg via ORAL
  Filled 2017-12-05 (×2): qty 1

## 2017-12-05 MED ORDER — SENNOSIDES-DOCUSATE SODIUM 8.6-50 MG PO TABS: 1.0000 | ORAL_TABLET | Freq: Two times a day (BID) | ORAL | Status: DC | PRN

## 2017-12-05 MED ORDER — ASPIRIN EC 325 MG PO TBEC
325.0000 mg | DELAYED_RELEASE_TABLET | Freq: Every day | ORAL | Status: DC
  Administered 2017-12-06 – 2017-12-07 (×2): 325 mg via ORAL
  Filled 2017-12-05 (×2): qty 1

## 2017-12-05 MED ORDER — LORATADINE 10 MG PO TABS
10.0000 mg | ORAL_TABLET | Freq: Every day | ORAL | Status: DC
  Administered 2017-12-06 – 2017-12-07 (×2): 10 mg via ORAL
  Filled 2017-12-05 (×2): qty 1

## 2017-12-05 MED ORDER — POLYETHYLENE GLYCOL 3350 17 GM/SCOOP PO POWD: 0.5000 | Freq: Once | ORAL | Status: DC

## 2017-12-05 MED ORDER — SENNOSIDES-DOCUSATE SODIUM 8.6-50 MG PO TABS
1.0000 | ORAL_TABLET | Freq: Two times a day (BID) | ORAL | Status: DC | PRN
Start: 2017-12-05 — End: 2017-12-07

## 2017-12-05 MED ORDER — HYDROMORPHONE HCL 1 MG/ML IJ SOLN
1.0000 mg | INTRAMUSCULAR | Status: DC | PRN
  Administered 2017-12-05 – 2017-12-06 (×2): 1 mg via INTRAVENOUS
  Filled 2017-12-05 (×2): qty 1

## 2017-12-05 MED ORDER — TRAZODONE HCL 50 MG PO TABS
100.0000 mg | ORAL_TABLET | Freq: Every day | ORAL | Status: DC
  Administered 2017-12-05 – 2017-12-06 (×2): 100 mg via ORAL
  Filled 2017-12-05 (×3): qty 2

## 2017-12-05 NOTE — ED Provider Notes (Signed)
[facility] EMERGENCY DEPARTMENT Provider Note   CSN: [id number] Arrival date & time: 12/05/17  1950     History   Chief Complaint Chief Complaint  Patient presents with  . Back Pain  . Spasms    HPI JUSTEN FONDA is a 77 y.o. male.  HPI 77 year old male with history of AAA, chronic lower back pain status post multiple spine surgeries, including fusion surgery in May 2019 comes in with chief complaint of worsening back pain.  Patient reports that he has had persistent pain since his last surgery, however over the past few days his symptoms have progressed.  Patient has been taking 10 mg of oxycodone without satisfactory relief.  Patient is also been having spasming of the back because of his pain.  Patient's pain is located over his lower thoracic and upper lumbar spine.  He denies any associated numbness, tingling, lower extremity weakness, urinary incontinence, urinary retention or saddle anesthesia.  Review of system is also negative for any fevers, chills or diaphoresis.  Additionally, patient had an admission recently for anemia.  Patient at that time received 2 units of PRBCs, and is now getting IV infusion of iron.  Patient denies any bloody stools, but continues to feel weak.  Past Medical History:  Diagnosis Date  . Abnormal CT scan    Asymmetric left rectal wall thickening   . Anemia   . Anxiety   . Arrhythmia   . Chronic lower back pain   . Complication of anesthesia    Memory loss 09/2015  . Coronary artery disease   . Depression   . GERD (gastroesophageal reflux disease)   . Glaucoma   . H/O thoracic aortic aneurysm repair   . Heart murmur   . History of being hospitalized    memory lose kidney funtion down blood pressure up  . History of blood transfusion    "think he had one when he had heart valve OR" (10/14/2016); "none since" (10/19/2017)  . History of chicken pox   . Hyperlipidemia   . Hypertension   . Lumbar stenosis   .  Neuropathy   . Osteoarthritis    worse in feet and ankles  . Paroxysmal A-fib ([place])   . Poor short term memory    takes Aricept  . Rheumatoid arthritis ([place])    "all over" (10/14/2016)  . Schizophrenia ([place])   . Seasonal allergies   . Valvular heart disease     Patient Active Problem List   Diagnosis Date Noted  . Low back pain 12/05/2017  . Other fatigue   . Anemia   . AKI (acute kidney injury) ([place])   . Fracture lumbar vertebra-closed ([place]) 10/20/2017  . Pseudoarthrosis of lumbar spine 09/28/2017  . Anxiety 06/24/2017  . Lumbar burst fracture, closed, initial encounter ([facility]) 06/08/2017  . Conjunctivitis, bacterial 03/22/2017  . Urge incontinence 12/15/2016  . Ataxia 11/09/2016  . Insomnia 11/09/2016  . Transient memory loss 11/09/2016  . Nausea and vomiting 10/15/2016  . Spinal stenosis of lumbar region 10/14/2016  . Leukopenia 09/27/2016  . Thrombocytopenia ([place]) 09/27/2016  . Other pancytopenia ([place]) 09/27/2016  . History of left knee replacement 09/01/2016  . Fall 09/01/2016  . HNP (herniated nucleus pulposus with myelopathy), thoracic 03/08/2016  . Rectal nodule 02/23/2016  . DDD (degenerative disc disease), lumbar 01/02/2016  . Lumbar radiculopathy 01/02/2016  . Facet syndrome, lumbar 01/02/2016  . Greater trochanteric bursitis 11/28/2015  . Acute renal failure ([place]) 11/06/2015  . Dehydration 10/17/2015  .  IBS (irritable bowel syndrome) 07/15/2015  . Erectile dysfunction of organic origin 07/15/2015  . Carotid stenosis 05/06/2015  . Difficulty sleeping 04/15/2015  . 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 Chronic kidney disease 02/17/2015  . 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  . Enlarged prostate with lower urinary tract symptoms (LUTS) 02/17/2015  . Rheumatoid arthritis ([place]) 10/01/2014  . Sacroiliac joint disease 10/01/2014  . Fusion of spine, sacral and sacrococcygeal region 10/01/2014  . DDD (degenerative disc  disease), cervical 10/01/2014  . Lumbar and sacral osteoarthritis 10/01/2014  . Mechanical and motor problems with internal organs 06/20/2014  . Iron deficiency anemia 06/19/2014  . Symptomatic anemia 06/19/2014  . Aneurysm, ascending aorta ([place]) 05/27/2014  . Aneurysm of thoracic aorta ([place]) 05/27/2014  . Thoracic aortic aneurysm without rupture ([place]) 05/27/2014  . Cognitive decline 04/01/2014  . Spinal stenosis of lumbar region with radiculopathy 01/30/2014  . Vitamin D deficiency 01/30/2014  . Dorsalgia, unspecified 01/30/2014  . Lumbar canal stenosis 01/30/2014  . Paroxysmal atrial fibrillation ([place]) 11/27/2013  . S/P AVR (aortic valve replacement) 11/27/2013  . History of ascending aorta repair 11/27/2013  . Hyperlipidemia 11/27/2013  . Essential hypertension 11/27/2013  . Bicuspid aortic valve 11/27/2013  . Atherosclerotic heart disease of native coronary artery without angina pectoris 11/27/2013  . 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 History of open heart surgery 11/27/2013  . Carotid artery insufficiency syndrome 11/06/2010    Past Surgical History:  Procedure Laterality Date  . AORTIC VALVE REPLACEMENT  2007   [facility].  [place], [place]; "pig valve"  . APPENDECTOMY  2010  . BACK SURGERY    . CARDIAC VALVE REPLACEMENT    . CATARACT EXTRACTION W/ INTRAOCULAR LENS  IMPLANT, BILATERAL Bilateral 2012  . COLONOSCOPY WITH PROPOFOL N/A 09/24/2016   Procedure: COLONOSCOPY WITH PROPOFOL;  Surgeon: Jonathon Bellows, MD;  Location: [facility] ENDOSCOPY;  Service: Endoscopy;  Laterality: N/A;  . ESOPHAGOGASTRODUODENOSCOPY (EGD) WITH PROPOFOL N/A 09/24/2016   Procedure: ESOPHAGOGASTRODUODENOSCOPY (EGD) WITH PROPOFOL;  Surgeon: Jonathon Bellows, MD;  Location: [facility] ENDOSCOPY;  Service: Endoscopy;  Laterality: N/A;  . GIVENS CAPSULE STUDY N/A 09/24/2016   Procedure: GIVENS CAPSULE STUDY;  Surgeon: Jonathon Bellows, MD;  Location: [facility] ENDOSCOPY;  Service: Endoscopy;  Laterality: N/A;  .  HAMMER TOE SURGERY Right 10/09/2015   Procedure: HAMMER TOE REPAIR WITH K-WIRE FIXATION RIGHT SECOND TOE;  Surgeon: Albertine Patricia, DPM;  Location: [facility];  Service: Podiatry;  Laterality: Right;  WITH LOCAL  . JOINT REPLACEMENT    . LAMINECTOMY WITH POSTERIOR LATERAL ARTHRODESIS LEVEL 3 N/A 09/28/2017   Procedure: LUMBAR  POSTEROR  FUSION REVISION - LUMBAR ONE -TWO, LUMBAR TWO -THREE,THREE-FOUR ,BILATERAL ARTHRODESIS REMOVAL LUMBAR THREE HARDWARE;  Surgeon: Kary Kos, MD;  Location: [facility];  Service: Neurosurgery;  Laterality: N/A;  . LAMINECTOMY WITH POSTERIOR LATERAL ARTHRODESIS LEVEL 3 N/A 10/20/2017   Procedure: Revision fusion and removal of hardware Lumbar one, Pedicle screw fixation thoracic ten-lumbar two, thoracic nine-ten laminotomy, Posterior Lumbar Arthrodesis thoracic ten-lumbar two ;  Surgeon: Kary Kos, MD;  Location: [facility];  Service: Neurosurgery;  Laterality: N/A;  . LAPAROSCOPIC CHOLECYSTECTOMY  2010  . LUMBAR FUSION Right 06/08/2017   LUMBAR THREE-FOUR, LUMBAR FOUR-FIVE POSTEROLATERAL ARTHRODESIS WITH RIGHT LUMBAR FOUR-FIVE LAMINECTOMY/FORAMINOTOMY  . LUMBAR LAMINECTOMY/DECOMPRESSION MICRODISCECTOMY Right 03/08/2016   Procedure: Laminectomy and Foraminotomy - Lumbar Five-Sacral One Right;  Surgeon: Kary Kos, MD;  Location: [facility];  Service: Neurosurgery;  Laterality: Right;  Right  . MULTIPLE TOOTH  EXTRACTIONS     "3"  . POSTERIOR LUMBAR FUSION  10/14/2016   L5-S1  . REPLACEMENT TOTAL KNEE Left 2003  . THORACIC AORTIC ANEURYSM REPAIR  2010        Home Medications    Prior to Admission medications   Medication Sig Start Date End Date Taking? Authorizing Provider  acetaminophen (TYLENOL) 325 MG tablet Take 650 mg by mouth 4 (four) times daily.    [provider]  amLODipine (NORVASC) 5 MG tablet TAKE ONE TABLET BY MOUTH EVERY DAY 06/14/17   [name], MD  amoxicillin (AMOXIL) 500 MG capsule Take 2,000 mg by mouth once. 1 hour prior to dental  appointment    [provider]  aspirin 325 MG EC tablet Take 325 mg by mouth daily at 2 PM.     [provider]  atorvastatin (LIPITOR) 40 MG tablet Take 1 tablet (40 mg total) by mouth daily. Patient taking differently: Take 40 mg by mouth daily at 6 PM.  12/15/16   [name], MD  cetirizine (ZYRTEC) 10 MG tablet Take 10 mg by mouth daily.    [provider]  cyclobenzaprine (FLEXERIL) 5 MG tablet Take 1 tablet (5 mg total) by mouth 3 (three) times daily as needed for muscle spasms. Patient taking differently: Take 10 mg by mouth 3 (three) times daily as needed for muscle spasms.  10/24/17   Meyran, Ocie Cornfield, NP  donepezil (ARICEPT) 5 MG tablet Take 5 mg by mouth at bedtime.     [provider]  fenofibrate 160 MG tablet TAKE ONE TABLET BY MOUTH EVERY DAY Patient taking differently: TAKE ONE TABLET BY MOUTH DAILY 08/29/17   Minna Merritts, MD  ferrous sulfate 325 (65 FE) MG tablet Take 1 tablet (325 mg total) by mouth 2 (two) times daily with a meal. Patient not taking: Reported on 11/17/2017 10/31/17   Vaughan Basta, MD  FLUoxetine (PROZAC) 20 MG capsule TAKE 1 CAPSULE BY MOUTH EVERY DAY 09/23/17   [name], MD  folic acid (FOLVITE) 1 MG tablet Take 1 mg by mouth 2 (two) times daily.    [provider]  gabapentin (NEURONTIN) 400 MG capsule Take 400 mg by mouth 3 (three) times daily.     [provider]  GAVILAX powder USE 1 CAPFUL IN 4-8 OUNCES OF LIQUID DAILY AS DIRECTED 11/25/17   [name], MD  HYDROcodone-acetaminophen (NORCO) 10-325 MG tablet Take 1 tablet by mouth every 6 (six) hours as needed for moderate pain. 10/31/17   Vaughan Basta, MD  hydrocortisone cream 1 % Apply 1 application topically daily as needed for itching.    [provider]  lidocaine (LIDODERM) 5 % Place 1 patch onto the skin daily. Remove & Discard patch within 12 hours or as directed by MD    [provider]  lisinopril (PRINIVIL,ZESTRIL) 20 MG tablet TAKE ONE TABLET BY MOUTH EVERY EVENING 12/24/16   Gollan, Kathlene November, MD  mirabegron ER (MYRBETRIQ) 50 MG TB24 tablet Take 50 mg by mouth daily.    [provider]  omeprazole (PRILOSEC) 40 MG capsule TAKE 1 CAPSULE BY MOUTH DAILY 02/28/17   Jonathon Bellows, MD  senna-docusate (SENOKOT-S) 8.6-50 MG tablet Take 1 tablet by mouth 2 (two) times daily.    [provider]  tamsulosin (FLOMAX) 0.4 MG CAPS capsule TAKE 1 CAPSULE BY MOUTH DAILY 04/29/17   Crecencio Mc, MD  testosterone cypionate (DEPOTESTOSTERONE CYPIONATE) 200 MG/ML injection INJECT  40mL INTO THE MUSCLE EVERY 14 DAYS 07/29/17   McGowan, Larene Beach A, PA-C  traZODone (DESYREL) 100 MG tablet Take 100 mg by mouth at bedtime.    [provider]    Family History Family History  Problem Relation Age of Onset  . Heart failure Mother   . Hypertension Mother   . Asthma Mother   . Heart attack Father 65       MI  . Hypertension Sister   . Prostate cancer Neg Hx   . Bladder Cancer Neg Hx   . Kidney cancer Neg Hx     Social History Social History   Tobacco Use  . Smoking status: Former Smoker    Packs/day: 1.00    Years: 32.00    Pack years: 32.00    Types: Cigarettes    Last attempt to quit: 07/05/1981    Years since quitting: 36.4  . Smokeless tobacco: Never Used  Substance Use Topics  . Alcohol use: Yes    Comment: 10/19/2017 "glass of wine/month; if that"  . Drug use: Never     Allergies   Penicillins and Demerol [meperidine]   Review of Systems Review of Systems  Constitutional: Positive for activity change and fatigue.  Gastrointestinal: Negative for abdominal pain.  Musculoskeletal: Positive for back pain.  Neurological: Positive for weakness. Tremors:    All other systems reviewed and are negative.    Physical Exam Updated Vital Signs BP (!) 146/73   Pulse 63   Temp 97.9 F (36.6 C) (Oral)   Resp 14   Ht 5\' 11"  (1.803 m)    Wt 83.5 kg (184 lb)   SpO2 96%   BMI 25.66 kg/m   Physical Exam  Constitutional: He is oriented to person, place, and time. He appears well-developed.  HENT:  Head: Atraumatic.  Neck: Neck supple.  Cardiovascular: Normal rate.  Pulmonary/Chest: Effort normal.  Abdominal: Soft.  Musculoskeletal:  Pt has tenderness over the lumbar and thoracic region No step offs, no erythema. Pt has 1+ patellar reflex bilaterally. Able to discriminate between sharp and dull. Able to ambulate   Neurological: He is alert and oriented to person, place, and time.  Skin: Skin is warm.  Nursing note and vitals reviewed.    ED Treatments / Results  Labs (all labs ordered are listed, but only abnormal results are displayed) Labs Reviewed  CBC WITH DIFFERENTIAL/PLATELET - Abnormal; Notable for the following components:      Result Value   RBC 2.69 (*)    Hemoglobin 7.5 (*)    HCT 25.0 (*)    RDW 16.9 (*)    Lymphs Abs 0.6 (*)    All other components within normal limits  BASIC METABOLIC PANEL - Abnormal; Notable for the following components:   BUN 24 (*)    Creatinine, Ser 1.34 (*)    Calcium 8.8 (*)    GFR calc non Af Amer 49 (*)    GFR calc Af Amer 56 (*)    All other components within normal limits    EKG None  Radiology No results found.  Procedures Procedures (including critical care time)  Medications Ordered in ED Medications  fentaNYL (SUBLIMAZE) injection 100 mcg (100 mcg Intravenous Given 12/05/17 1623)  diazepam (VALIUM) tablet 5 mg (5 mg Oral Given 12/05/17 1623)     Initial Impression / Assessment and Plan / ED Course  I have reviewed the triage vital signs and the nursing notes.  Pertinent labs & imaging results that were  available during my care of the patient were reviewed by me and considered in my medical decision making (see chart for details).     77 year old male with history of chronic back pain status post multiple spine surgeries and AAA comes in with  chief complaint of persistent back pain.  Patient states that his back pain has been ongoing for several weeks, and despite recent surgery it has persisted.  In fact patient's pain has gotten worse over the past few days.  History and exam are not suggestive of any acute spinal cord compression.  No clinical concerns for underlying deep space infection at this time.  Neurologic exam is nonfocal and overall reassuring.  CT scan of the L and T-spine have been ordered, we consulted Dr. Saintclair Halsted, neurosurgery, who has reviewed the imaging and is not overall too impressed with the CT scan.  They will admit patient for pain control.  Given that patient has history of AAA and drop in hemoglobin, with a persistent back pain -we will get a CT abdomen pelvis to screen for AAA and to ensure there is no retroperitoneal bleeding.  Neurosurgery team has been made aware of this imaging order.  Patient has mild creatinine elevation, will give him some fluid, given that he will be getting IV contrast.  Final Clinical Impressions(s) / ED Diagnoses   Final diagnoses:  Severe back pain  Muscle spasm    ED Discharge Orders    None       Varney Biles, MD 12/05/17 1729

## 2017-12-05 NOTE — Progress Notes (Signed)
Patient arrived on unit about 1830, at 1730 patient is gone for MRI, orders confirmed, no complaints of pain from patient

## 2017-12-05 NOTE — H&P (Signed)
Bryan [name] is an 77 y.o. male.   Chief Complaint: back pain HPI: 77 year old gentleman with axial back pain some pain that goes down to his low back occasionally some pain into his hips but denies any radicular pain denies any new numbness and tingling in his legs or his feet. He was seen in the office last week with the pain and ordered an outpatient CT patient had not had that CT yet.  Past Medical History:  Diagnosis Date  . Abnormal CT scan    Asymmetric left rectal wall thickening   . Anemia   . Anxiety   . Arrhythmia   . Chronic lower back pain   . Complication of anesthesia    Memory loss 09/2015  . Coronary artery disease   . Depression   . GERD (gastroesophageal reflux disease)   . Glaucoma   . H/O thoracic aortic aneurysm repair   . Heart murmur   . History of being hospitalized    memory lose kidney funtion down blood pressure up  . History of blood transfusion    "think he had one when he had heart valve OR" (10/14/2016); "none since" (10/19/2017)  . History of chicken pox   . Hyperlipidemia   . Hypertension   . Lumbar stenosis   . Neuropathy   . Osteoarthritis    worse in feet and ankles  . Paroxysmal A-fib ([place])   . Poor short term memory    takes Aricept  . Rheumatoid arthritis ([place])    "all over" (10/14/2016)  . Schizophrenia ([place])   . Seasonal allergies   . Valvular heart disease     Past Surgical History:  Procedure Laterality Date  . AORTIC VALVE REPLACEMENT  2007   [facility]  Supply, [place]; "pig valve"  . APPENDECTOMY  2010  . BACK SURGERY    . CARDIAC VALVE REPLACEMENT    . CATARACT EXTRACTION W/ INTRAOCULAR LENS  IMPLANT, BILATERAL Bilateral 2012  . COLONOSCOPY WITH PROPOFOL N/A 09/24/2016   Procedure: COLONOSCOPY WITH PROPOFOL;  Surgeon: Jonathon Bellows, MD;  Location: [facility] ENDOSCOPY;  Service: Endoscopy;  Laterality: N/A;  . ESOPHAGOGASTRODUODENOSCOPY (EGD) WITH PROPOFOL N/A 09/24/2016   Procedure: ESOPHAGOGASTRODUODENOSCOPY (EGD) WITH  PROPOFOL;  Surgeon: Jonathon Bellows, MD;  Location: [facility] ENDOSCOPY;  Service: Endoscopy;  Laterality: N/A;  . GIVENS CAPSULE STUDY N/A 09/24/2016   Procedure: GIVENS CAPSULE STUDY;  Surgeon: Jonathon Bellows, MD;  Location: [facility] ENDOSCOPY;  Service: Endoscopy;  Laterality: N/A;  . HAMMER TOE SURGERY Right 10/09/2015   Procedure: HAMMER TOE REPAIR WITH K-WIRE FIXATION RIGHT SECOND TOE;  Surgeon: Albertine Patricia, DPM;  Location: [place];  Service: Podiatry;  Laterality: Right;  WITH LOCAL  . JOINT REPLACEMENT    . LAMINECTOMY WITH POSTERIOR LATERAL ARTHRODESIS LEVEL 3 N/A 09/28/2017   Procedure: LUMBAR  POSTEROR  FUSION REVISION - LUMBAR ONE -TWO, LUMBAR TWO -THREE,THREE-FOUR ,BILATERAL ARTHRODESIS REMOVAL LUMBAR THREE HARDWARE;  Surgeon: Kary Kos, MD;  Location: [place];  Service: Neurosurgery;  Laterality: N/A;  . LAMINECTOMY WITH POSTERIOR LATERAL ARTHRODESIS LEVEL 3 N/A 10/20/2017   Procedure: Revision fusion and removal of hardware Lumbar one, Pedicle screw fixation thoracic ten-lumbar two, thoracic nine-ten laminotomy, Posterior Lumbar Arthrodesis thoracic ten-lumbar two ;  Surgeon: Kary Kos, MD;  Location: [place];  Service: Neurosurgery;  Laterality: N/A;  . LAPAROSCOPIC CHOLECYSTECTOMY  2010  . LUMBAR FUSION Right 06/08/2017   LUMBAR THREE-FOUR, LUMBAR FOUR-FIVE POSTEROLATERAL ARTHRODESIS WITH RIGHT LUMBAR FOUR-FIVE LAMINECTOMY/FORAMINOTOMY  . LUMBAR LAMINECTOMY/DECOMPRESSION MICRODISCECTOMY Right  03/08/2016   Procedure: Laminectomy and Foraminotomy - Lumbar Five-Sacral One Right;  Surgeon: Kary Kos, MD;  Location: [place];  Service: Neurosurgery;  Laterality: Right;  Right  . MULTIPLE TOOTH EXTRACTIONS     "3"  . POSTERIOR LUMBAR FUSION  10/14/2016   L5-S1  . REPLACEMENT TOTAL KNEE Left 2003  . THORACIC AORTIC ANEURYSM REPAIR  2010    Family History  Problem Relation Age of Onset  . Heart failure Mother   . Hypertension Mother   . Asthma Mother   . Heart attack Father 49       MI  .  Hypertension Sister   . Prostate cancer Neg Hx   . Bladder Cancer Neg Hx   . Kidney cancer Neg Hx    Social History:  reports that he quit smoking about 36 years ago. His smoking use included cigarettes. He has a 32.00 pack-year smoking history. He has never used smokeless tobacco. He reports that he drinks alcohol. He reports that he does not use drugs.  Allergies:  Allergies  Allergen Reactions  . Penicillins Hives, Swelling and Other (See Comments)    SWELLING REACTION UNSPECIFIED PATIENT HAS TAKEN AMOXICILLIN ON MED HX FROM [facility] PATIENT HAS HAD A PCN REACTION WITH IMMEDIATE RASH, FACIAL/TONGUE/THROAT SWELLING, SOB, OR LIGHTHEADEDNESS WITH HYPOTENSION:  #  #  #  YES  #  #  #   Has patient had a PCN reaction causing severe rash involving mucus membranes or skin necrosis: No Has patient had a PCN reaction that required hospitalization No Has patient had a PCN reaction occurring within the last 10 years: No  . Demerol [Meperidine] Hives and Nausea And Vomiting     (Not in a hospital admission)  Results for orders placed or performed during the hospital encounter of 12/05/17 (from the past 48 hour(s))  CBC with Differential     Status: Abnormal   Collection Time: 12/05/17  9:54 AM  Result Value Ref Range   WBC 4.1 4.0 - 10.5 K/uL   RBC 2.69 (L) 4.22 - 5.81 MIL/uL   Hemoglobin 7.5 (L) 13.0 - 17.0 g/dL   HCT 25.0 (L) 39.0 - 52.0 %   MCV 92.9 78.0 - 100.0 fL   MCH 27.9 26.0 - 34.0 pg   MCHC 30.0 30.0 - 36.0 g/dL   RDW 16.9 (H) 11.5 - 15.5 %   Platelets 164 150 - 400 K/uL   Neutrophils Relative % 69 %   Neutro Abs 2.9 1.7 - 7.7 K/uL   Lymphocytes Relative 15 %   Lymphs Abs 0.6 (L) 0.7 - 4.0 K/uL   Monocytes Relative 12 %   Monocytes Absolute 0.5 0.1 - 1.0 K/uL   Eosinophils Relative 3 %   Eosinophils Absolute 0.1 0.0 - 0.7 K/uL   Basophils Relative 0 %   Basophils Absolute 0.0 0.0 - 0.1 K/uL   Immature Granulocytes 1 %   Abs Immature Granulocytes 0.0 0.0 - 0.1 K/uL     Comment: Performed at [facility], [address], [place], [place] [address]  Basic metabolic panel     Status: Abnormal   Collection Time: 12/05/17  9:54 AM  Result Value Ref Range   Sodium 139 135 - 145 mmol/L   Potassium 4.2 3.5 - 5.1 mmol/L   Chloride 107 98 - 111 mmol/L    Comment: Please note change in reference range.   CO2 24 22 - 32 mmol/L   Glucose, Bld 91 70 - 99 mg/dL  Comment: Please note change in reference range.   BUN 24 (H) 8 - 23 mg/dL    Comment: Please note change in reference range.   Creatinine, Ser 1.34 (H) 0.61 - 1.24 mg/dL   Calcium 8.8 (L) 8.9 - 10.3 mg/dL   GFR calc non Af Amer 49 (L) >60 mL/min   GFR calc Af Amer 56 (L) >60 mL/min    Comment: (NOTE) The eGFR has been calculated using the CKD EPI equation. This calculation has not been validated in all clinical situations. eGFR's persistently <60 mL/min signify possible Chronic Kidney Disease.    Anion gap 8 5 - 15    Comment: Performed at [facility] [address] [place], [place] [address]   No results found.  Review of Systems  Musculoskeletal: Positive for back pain, joint pain and myalgias.    Blood pressure (!) 146/73, pulse 63, temperature 97.9 F (36.6 C), temperature source Oral, resp. rate 14, height 5' 11"  (1.803 m), weight 83.5 kg (184 lb), SpO2 96 %. Physical Exam  Constitutional: He is oriented to person, place, and time. He appears well-developed.  HENT:  Head: Normocephalic.  Eyes: Pupils are equal, round, and reactive to light.  Neck: Normal range of motion.  GI: Soft.  Musculoskeletal: Normal range of motion.  Neurological: He is alert and oriented to person, place, and time. He has normal strength. GCS eye subscore is 4. GCS verbal subscore is 5. GCS motor subscore is 6.  Patient is awake alert strength 5 out of 5 lower 70s bilaterally tender around the middle aspect of his incision centered around L1-L2. No sign of overt infection no erythema or induration a  little bit of swelling in that area  Skin: Skin is warm and dry.     Assessment/Plan 58 year gentleman status post track lumbar revision surgery for pseudoarthrosis presents with increased back pain without radiation no numbness or tingling in his legs or his feet. CT scans here in the ER overall I think look fine screws. In good position with no overt sign of loosening. I see no comp getting features. I will await the final report that we will make the patient for pain control and again MRI scan as well as some blood work to rule out infection.  Ercell Perlman P, MD 12/05/2017, 5:14 PM

## 2017-12-05 NOTE — ED Triage Notes (Addendum)
Pt presents with c/o increased upper/middle back pain since vertebral surgery 5/29. He reports that he was supposed to have a CT scan ordered by [facility] but they are unable to see him until Wednesday. He is unable wait until then due to the severity of pain. Pt is a/o. Denies blood thinner use.

## 2017-12-05 NOTE — ED Notes (Signed)
Pt transported to CT ?

## 2017-12-06 ENCOUNTER — Encounter (HOSPITAL_COMMUNITY): Payer: Self-pay | Admitting: Internal Medicine

## 2017-12-06 ENCOUNTER — Observation Stay (HOSPITAL_COMMUNITY): Payer: Medicare Other

## 2017-12-06 ENCOUNTER — Inpatient Hospital Stay: Payer: Medicare Other | Admitting: Family Medicine

## 2017-12-06 DIAGNOSIS — I714 Abdominal aortic aneurysm, without rupture: Secondary | ICD-10-CM | POA: Diagnosis not present

## 2017-12-06 DIAGNOSIS — M545 Low back pain: Secondary | ICD-10-CM | POA: Diagnosis not present

## 2017-12-06 DIAGNOSIS — N183 Chronic kidney disease, stage 3 (moderate): Secondary | ICD-10-CM | POA: Diagnosis not present

## 2017-12-06 DIAGNOSIS — R918 Other nonspecific abnormal finding of lung field: Secondary | ICD-10-CM | POA: Diagnosis present

## 2017-12-06 DIAGNOSIS — D508 Other iron deficiency anemias: Secondary | ICD-10-CM

## 2017-12-06 LAB — PROCALCITONIN: Procalcitonin: <0.1 ng/mL

## 2017-12-06 MED ORDER — IOHEXOL 300 MG/ML  SOLN
100.0000 mL | Freq: Once | INTRAMUSCULAR | Status: AC | PRN
  Administered 2017-12-06: 100 mL via INTRAVENOUS

## 2017-12-06 NOTE — Care Management Note (Addendum)
Case Management Note  Patient Details  Name: Bryan [name] MRN: [id number] Date of Birth: 1940/09/20  Subjective/Objective:                 Spoke to patient, he lives at home w wife, has RW and is active w [facility]. Will continue to follow for additional CM needs   Action/Plan:  Needs resumption of [facility] orders and referral. 7/17 Patient observation, does not need resumption orders, notified Tiffany w [facility] of DC today and resumption of care   Expected Discharge Date:                  Expected Discharge Plan:  [place]  In-House Referral:     Discharge planning Services  CM Consult  Post Acute Care Choice:  Resumption of Svcs/PTA Provider, Home Health Choice offered to:     DME Arranged:    DME Agency:     HH Arranged:  PT, OT HH Agency:  [facility]  Status of Service:     If discussed at [place] of Stay Meetings, dates discussed:    Additional Comments:  Carles Collet, RN 12/06/2017, 1:58 PM

## 2017-12-06 NOTE — Care Management Obs Status (Signed)
[facility] NOTIFICATION   Patient Details  Name: REFUGIO VANDEVOORDE MRN: [id number] Date of Birth: 23-Jul-1940   Medicare Observation Status Notification Given:  Yes    Carles Collet, RN 12/06/2017, 1:55 PM

## 2017-12-06 NOTE — Consult Note (Signed)
Medical Consultation   Bryan [name]  DVV:[id number]  DOB: 1941/05/11  DOA: 12/05/2017  PCP: [name], MD   Outpatient Specialists:  Saintclair Halsted - neurosurgery; [name] - cardiology, [place]; Pain management at Dr. Windy Carina office; [facility] - urology; [place] - anemia  Requesting physician: Dr. Saintclair Halsted  Reason for consultation: Admitted last night for back pain.  CT of thoracic spine shows PNA, requesting assistance with this issue.   History of Present Illness: Bryan [name] is an 77 y.o. male with PMH of schizophrenia; RA; afib; dementia; HTN; HLD; chronic back pain; GERD; and CAD.  He was admitted last night for pain control for his back pain and overnight CT showed PNA.  Patient has chronic back pain and he had 2 back surgeries in May with Dr. Saintclair Halsted.  The screws dislodged after the first surgery.  On the second surgery, the screws were replaced and he had new ones put in.  He has had constant pain from this ongoing.  He denies any symptoms of pneumonia.  He has mild cough, slightly productive of clear phlegm.  No fevers.  No sick contacts.  He noticed LE edema about 2 days ago.  No weight changes.  No orthopnea.  No PND.   Review of Systems:  ROS As per HPI otherwise 10 point review of systems negative.    Past Medical History: Past Medical History:  Diagnosis Date  . Abnormal CT scan    Asymmetric left rectal wall thickening   . Anemia   . Anxiety   . Arrhythmia   . Chronic lower back pain   . Complication of anesthesia    Memory loss 09/2015  . Coronary artery disease   . Depression   . GERD (gastroesophageal reflux disease)   . Glaucoma   . H/O thoracic aortic aneurysm repair   . Heart murmur   . History of being hospitalized    memory lose kidney funtion down blood pressure up  . History of blood transfusion    "think he had one when he had heart valve OR" (10/14/2016); "none since" (10/19/2017)  . History of chicken pox   . Hyperlipidemia    . Hypertension   . Lumbar stenosis   . Neuropathy   . Osteoarthritis    worse in feet and ankles  . Paroxysmal A-fib ([place])   . Poor short term memory    takes Aricept  . Rheumatoid arthritis ([place])    "all over" (10/14/2016)  . Schizophrenia ([place])   . Seasonal allergies   . Valvular heart disease     Past Surgical History: Past Surgical History:  Procedure Laterality Date  . AORTIC VALVE REPLACEMENT  2007   [facility].  [place], [place]; "pig valve"  . APPENDECTOMY  2010  . BACK SURGERY    . CARDIAC VALVE REPLACEMENT    . CATARACT EXTRACTION W/ INTRAOCULAR LENS  IMPLANT, BILATERAL Bilateral 2012  . COLONOSCOPY WITH PROPOFOL N/A 09/24/2016   Procedure: COLONOSCOPY WITH PROPOFOL;  Surgeon: Jonathon Bellows, MD;  Location: [facility] ENDOSCOPY;  Service: Endoscopy;  Laterality: N/A;  . ESOPHAGOGASTRODUODENOSCOPY (EGD) WITH PROPOFOL N/A 09/24/2016   Procedure: ESOPHAGOGASTRODUODENOSCOPY (EGD) WITH PROPOFOL;  Surgeon: Jonathon Bellows, MD;  Location: [facility] ENDOSCOPY;  Service: Endoscopy;  Laterality: N/A;  . GIVENS CAPSULE STUDY N/A 09/24/2016   Procedure: GIVENS CAPSULE STUDY;  Surgeon: Jonathon Bellows, MD;  Location: [facility] ENDOSCOPY;  Service: Endoscopy;  Laterality: N/A;  .  HAMMER TOE SURGERY Right 10/09/2015   Procedure: HAMMER TOE REPAIR WITH K-WIRE FIXATION RIGHT SECOND TOE;  Surgeon: Albertine Patricia, DPM;  Location: [place];  Service: Podiatry;  Laterality: Right;  WITH LOCAL  . JOINT REPLACEMENT    . LAMINECTOMY WITH POSTERIOR LATERAL ARTHRODESIS LEVEL 3 N/A 09/28/2017   Procedure: LUMBAR  POSTEROR  FUSION REVISION - LUMBAR ONE -TWO, LUMBAR TWO -THREE,THREE-FOUR ,BILATERAL ARTHRODESIS REMOVAL LUMBAR THREE HARDWARE;  Surgeon: Kary Kos, MD;  Location: [place];  Service: Neurosurgery;  Laterality: N/A;  . LAMINECTOMY WITH POSTERIOR LATERAL ARTHRODESIS LEVEL 3 N/A 10/20/2017   Procedure: Revision fusion and removal of hardware Lumbar one, Pedicle screw fixation thoracic ten-lumbar two, thoracic  nine-ten laminotomy, Posterior Lumbar Arthrodesis thoracic ten-lumbar two ;  Surgeon: Kary Kos, MD;  Location: [place];  Service: Neurosurgery;  Laterality: N/A;  . LAPAROSCOPIC CHOLECYSTECTOMY  2010  . LUMBAR FUSION Right 06/08/2017   LUMBAR THREE-FOUR, LUMBAR FOUR-FIVE POSTEROLATERAL ARTHRODESIS WITH RIGHT LUMBAR FOUR-FIVE LAMINECTOMY/FORAMINOTOMY  . LUMBAR LAMINECTOMY/DECOMPRESSION MICRODISCECTOMY Right 03/08/2016   Procedure: Laminectomy and Foraminotomy - Lumbar Five-Sacral One Right;  Surgeon: Kary Kos, MD;  Location: [place];  Service: Neurosurgery;  Laterality: Right;  Right  . MULTIPLE TOOTH EXTRACTIONS     "3"  . POSTERIOR LUMBAR FUSION  10/14/2016   L5-S1  . REPLACEMENT TOTAL KNEE Left 2003  . THORACIC AORTIC ANEURYSM REPAIR  2010     Allergies:   Allergies  Allergen Reactions  . Penicillins Hives, Swelling and Other (See Comments)    SWELLING REACTION UNSPECIFIED PATIENT HAS TAKEN AMOXICILLIN ON MED HX FROM [facility] PATIENT HAS HAD A PCN REACTION WITH IMMEDIATE RASH, FACIAL/TONGUE/THROAT SWELLING, SOB, OR LIGHTHEADEDNESS WITH HYPOTENSION:  #  #  #  YES  #  #  #   Has patient had a PCN reaction causing severe rash involving mucus membranes or skin necrosis: No Has patient had a PCN reaction that required hospitalization No Has patient had a PCN reaction occurring within the last 10 years: No  . Demerol [Meperidine] Hives and Nausea And Vomiting     Social History:  reports that he quit smoking about 36 years ago. His smoking use included cigarettes. He has a 32.00 pack-year smoking history. He has never used smokeless tobacco. He reports that he drinks alcohol. He reports that he does not use drugs.   Family History: Family History  Problem Relation Age of Onset  . Heart failure Mother   . Hypertension Mother   . Asthma Mother   . Heart attack Father 18       MI  . Hypertension Sister   . Prostate cancer Neg Hx   . Bladder Cancer Neg Hx   . Kidney cancer Neg Hx        Physical Exam: Vitals:   12/05/17 2123 12/05/17 2356 12/06/17 0400 12/06/17 0809  BP: (!) 172/87 (!) 119/52 (!) 162/76 (!) 152/75  Pulse: 80 67 85 65  Resp: (!) 22 18 18 16   Temp: 98.1 F (36.7 C) 98.7 F (37.1 C) 97.8 F (36.6 C) 98.2 F (36.8 C)  TempSrc: Oral Oral Oral Oral  SpO2: 95% 91% 93% (!) 85%  Weight:      Height:        Constitutional: Alert and awake, oriented x3, not in any acute distress. Eyes: PERLA, EOMI, irises appear normal, anicteric sclera,  ENMT: external ears and nose appear normal, normal hearing, Lips appear normal Neck: neck appears normal, no masses, normal ROM, no thyromegaly, no  JVD  CVS: S1-S2 clear, no rubs or gallops, 4-5/6 mechanical murmur, 2+ pitting LE edema, normal pedal pulses  Respiratory:  clear to auscultation bilaterally, no wheezing, rales or rhonchi. Respiratory effort normal. No accessory muscle use.  Abdomen: soft nontender, nondistended, normal bowel sounds, no hepatosplenomegaly, no hernias  Musculoskeletal: : no cyanosis, clubbing or edema noted bilaterally Neuro: Cranial nerves II-XII intact, strength, sensation, reflexes Psych: judgement and insight appear normal, stable mood and affect, mental status Skin: no rashes or lesions or ulcers, no induration or nodules    Data reviewed:  I have personally reviewed the recent labs and imaging studies  Pertinent Labs:   BUN 24/Creatinine 1.34/GFR 49 - stable CRP 8.8 WBC 4.1 Hgb 7.5; baseline appears to be 8-9, normocytic ESR 64  Inpatient Medications:   Scheduled Meds: . amLODipine  5 mg Oral Daily  . aspirin  325 mg Oral Q1400  . atorvastatin  40 mg Oral Daily  . docusate sodium  100 mg Oral BID  . donepezil  5 mg Oral QHS  . fenofibrate  160 mg Oral Daily  . folic acid  1 mg Oral BID  . gabapentin  400 mg Oral TID  . lisinopril  20 mg Oral QPM  . loratadine  10 mg Oral Daily  . mirabegron ER  50 mg Oral Daily  . pantoprazole  40 mg Oral Daily  . senna  1  tablet Oral BID  . tamsulosin  0.4 mg Oral Daily  . traZODone  100 mg Oral QHS   Continuous Infusions:   Radiological Exams on Admission: Ct Thoracic Spine Wo Contrast  Result Date: 12/05/2017 CLINICAL DATA:  Increasing back pain, status post spinal hardware revision Sep 28, 2017 and Oct 20, 2017. EXAM: CT THORACIC AND LUMBAR SPINE WITHOUT CONTRAST TECHNIQUE: Multidetector CT imaging of the thoracic and lumbar spine was performed without contrast. Multiplanar CT image reconstructions were also generated. COMPARISON:  CT thoracic spine October 30, 2017, CT thoracic spine Oct 19, 2017, thoracolumbar spine radiographs November 22, 2017, lumbar spine radiograph Oct 11, 2017. FINDINGS: CT THORACIC SPINE FINDINGS ALIGNMENT: Maintained thoracic kyphosis. No malalignment. VERTEBRAE: Vertebral bodies intact. Status post RIGHT T9-10 hemilaminectomy. Status post T10 through T12 vertebral body cement augmentation, unchanged. Similar cement material within ventral epidural splay/fat since plexus extending to T8-9. Intact T10 through T12 well-seated pedicle screws. Not incorporated posterior bone graft material. Osteopenia without destructive bony lesions. PARASPINAL AND OTHER SOFT TISSUES: Moderate RIGHT and small LEFT pleural effusions. Patchy ground-glass opacities and upper lobe consolidation. Partially imaged cardiomegaly and pulmonary vascular congestion. Cement casting RIGHT upper lobe. DISC LEVELS: No canal stenosis. Cement extruded into T9-10 and T11 neural foramen. Severe RIGHT T8-9, RIGHT T9-10 neural foraminal narrowing. Moderate T9-10 through T12-L1 neural foraminal narrowing. CT LUMBAR SPINE FINDINGS SEGMENTATION: Continued numbering from thoracic levels. ALIGNMENT: Maintained lumbar lordosis. No malalignment. Mild dextroscoliosis on this nonweightbearing examination. VERTEBRAE: Moderate to severe L1 burst fracture (10 mm residual bone in craniocaudad dimension, previously 13 mm), 5 mm retropulsed bony fragments.  Acute versus subacute LEFT L2 displaced transverse process fracture. Old LEFT L3 transverse process fracture. Acute LEFT L5 transverse process and LEFT sacrum (zone 1) fractures. Old moderate L4 compression fracture, status post cement augmentation with cement in the L4-5 disc space. L2, L5 and S1 bilateral intact well-seated pedicle screws and bridging rods. L5-S1 arthrodesis. L3 pedicle screw tract. Osteopenia without destructive bony lesions. PARASPINAL AND OTHER SOFT TISSUES: Nonacute. 2 mm RIGHT upper pole nephrolithiasis. Partially imaged exophytic cyst RIGHT  kidney. Status post cholecystectomy. Moderate aortoiliac calcifications. DISC LEVELS: T12-L1: Retropulsed bony fragments resulting in mild canal stenosis. Moderate neural foraminal narrowing. L1-2: No disc bulge, canal stenosis nor neural foraminal narrowing. L2-3: No disc bulge, canal stenosis nor neural foraminal narrowing. L3-4: Small broad-based disc osteophyte complex. Mild LEFT facet arthropathy. No canal stenosis. Mild-to-moderate LEFT neural foraminal narrowing. L4-5: RIGHT hemilaminectomy. No disc bulge. No canal stenosis. Moderate to severe RIGHT, moderate LEFT neural foraminal narrowing. L5-S1: Posterior decompression. No canal stenosis. Moderate bilateral neural foraminal narrowing. IMPRESSION: CT THORACIC SPINE IMPRESSION 1. T10 through T12 posterior instrumentation and cement augmentation, unchanged. RIGHT T9-10 hemilaminectomy. 2. No fracture or malalignment. 3. Moderate RIGHT and small LEFT pleural effusions. Multifocal consolidation seen with pneumonia or focal pulmonary edema. 4. Cardiomegaly and pulmonary vascular congestion. CT LUMBAR SPINE IMPRESSION 1. Moderate to severe L1 burst fracture, further height loss from Oct 19, 2017. Acute LEFT L5 and LEFT sacrum nondisplaced fractures. Acute versus subacute LEFT L2 transverse process fracture. 2. Old moderate L4 compression fracture, status post cement augmentation. 3. Status post L2  through S1 fusion, L5-S1 arthrodesis. RIGHT L4-5 hemilaminectomy. Non incorporated posterior bone graft material. 4. No canal stenosis. Neural foraminal narrowing T12-L1, L3-4 through L5-S1: Moderate to severe on the RIGHT at L4-5. Aortic Atherosclerosis (ICD10-I70.0). Electronically Signed   By: Elon Alas M.D.   On: 12/05/2017 17:50   Ct Lumbar Spine Wo Contrast  Result Date: 12/05/2017 CLINICAL DATA:  Increasing back pain, status post spinal hardware revision Sep 28, 2017 and Oct 20, 2017. EXAM: CT THORACIC AND LUMBAR SPINE WITHOUT CONTRAST TECHNIQUE: Multidetector CT imaging of the thoracic and lumbar spine was performed without contrast. Multiplanar CT image reconstructions were also generated. COMPARISON:  CT thoracic spine October 30, 2017, CT thoracic spine Oct 19, 2017, thoracolumbar spine radiographs November 22, 2017, lumbar spine radiograph Oct 11, 2017. FINDINGS: CT THORACIC SPINE FINDINGS ALIGNMENT: Maintained thoracic kyphosis. No malalignment. VERTEBRAE: Vertebral bodies intact. Status post RIGHT T9-10 hemilaminectomy. Status post T10 through T12 vertebral body cement augmentation, unchanged. Similar cement material within ventral epidural splay/fat since plexus extending to T8-9. Intact T10 through T12 well-seated pedicle screws. Not incorporated posterior bone graft material. Osteopenia without destructive bony lesions. PARASPINAL AND OTHER SOFT TISSUES: Moderate RIGHT and small LEFT pleural effusions. Patchy ground-glass opacities and upper lobe consolidation. Partially imaged cardiomegaly and pulmonary vascular congestion. Cement casting RIGHT upper lobe. DISC LEVELS: No canal stenosis. Cement extruded into T9-10 and T11 neural foramen. Severe RIGHT T8-9, RIGHT T9-10 neural foraminal narrowing. Moderate T9-10 through T12-L1 neural foraminal narrowing. CT LUMBAR SPINE FINDINGS SEGMENTATION: Continued numbering from thoracic levels. ALIGNMENT: Maintained lumbar lordosis. No malalignment. Mild  dextroscoliosis on this nonweightbearing examination. VERTEBRAE: Moderate to severe L1 burst fracture (10 mm residual bone in craniocaudad dimension, previously 13 mm), 5 mm retropulsed bony fragments. Acute versus subacute LEFT L2 displaced transverse process fracture. Old LEFT L3 transverse process fracture. Acute LEFT L5 transverse process and LEFT sacrum (zone 1) fractures. Old moderate L4 compression fracture, status post cement augmentation with cement in the L4-5 disc space. L2, L5 and S1 bilateral intact well-seated pedicle screws and bridging rods. L5-S1 arthrodesis. L3 pedicle screw tract. Osteopenia without destructive bony lesions. PARASPINAL AND OTHER SOFT TISSUES: Nonacute. 2 mm RIGHT upper pole nephrolithiasis. Partially imaged exophytic cyst RIGHT kidney. Status post cholecystectomy. Moderate aortoiliac calcifications. DISC LEVELS: T12-L1: Retropulsed bony fragments resulting in mild canal stenosis. Moderate neural foraminal narrowing. L1-2: No disc bulge, canal stenosis nor neural foraminal narrowing. L2-3: No disc bulge,  canal stenosis nor neural foraminal narrowing. L3-4: Small broad-based disc osteophyte complex. Mild LEFT facet arthropathy. No canal stenosis. Mild-to-moderate LEFT neural foraminal narrowing. L4-5: RIGHT hemilaminectomy. No disc bulge. No canal stenosis. Moderate to severe RIGHT, moderate LEFT neural foraminal narrowing. L5-S1: Posterior decompression. No canal stenosis. Moderate bilateral neural foraminal narrowing. IMPRESSION: CT THORACIC SPINE IMPRESSION 1. T10 through T12 posterior instrumentation and cement augmentation, unchanged. RIGHT T9-10 hemilaminectomy. 2. No fracture or malalignment. 3. Moderate RIGHT and small LEFT pleural effusions. Multifocal consolidation seen with pneumonia or focal pulmonary edema. 4. Cardiomegaly and pulmonary vascular congestion. CT LUMBAR SPINE IMPRESSION 1. Moderate to severe L1 burst fracture, further height loss from Oct 19, 2017. Acute  LEFT L5 and LEFT sacrum nondisplaced fractures. Acute versus subacute LEFT L2 transverse process fracture. 2. Old moderate L4 compression fracture, status post cement augmentation. 3. Status post L2 through S1 fusion, L5-S1 arthrodesis. RIGHT L4-5 hemilaminectomy. Non incorporated posterior bone graft material. 4. No canal stenosis. Neural foraminal narrowing T12-L1, L3-4 through L5-S1: Moderate to severe on the RIGHT at L4-5. Aortic Atherosclerosis (ICD10-I70.0). Electronically Signed   By: Elon Alas M.D.   On: 12/05/2017 17:50   Mr Lumbar Spine W Wo Contrast  Result Date: 12/05/2017 CLINICAL DATA:  Worsening upper and mid back pain since spine revision surgery Oct 20, 2017. EXAM: MRI LUMBAR SPINE WITHOUT AND WITH CONTRAST TECHNIQUE: Multiplanar and multiecho pulse sequences of the lumbar spine were obtained without and with intravenous contrast. CONTRAST:  56m MULTIHANCE GADOBENATE DIMEGLUMINE 529 MG/ML IV SOLN COMPARISON:  CT lumbar spine December 05, 2017 FINDINGS: SEGMENTATION: For the purposes of this report, the last well-formed intervertebral disc is reported as L5-S1. ALIGNMENT: Maintained lumbar lordosis. No malalignment. VERTEBRAE:Examination limited by hardware artifact. Status post thoracolumbar and sacral posterior instrumentation. Moderate to severe acute L1 burst fracture with residual fracture line, associated low T1 and bright STIR signal. No suspicious enhancement. No new additional acute fractures less conspicuous. Old moderate L4 compression fracture, central low signal consistent with cement augmentation. Multilevel mild disc desiccation. Moderate L5-S1 disc height loss with arthrodesis better demonstrated on today's CT. CONUS MEDULLARIS AND CAUDA EQUINA: Conus medullaris obscured by hardware artifact. Normal appearing cauda equina at lumbosacral junction. No suspicious leptomeningeal nor epidural enhancement though limited by hardware artifact. PARASPINAL AND OTHER SOFT TISSUES:  Nonacute. Postoperative paraspinal muscle denervation. DISC LEVELS: L1-2: Obscured on axial sequences. No canal stenosis and neural foraminal at and on sagittal series. L2-3: No disc bulge, canal stenosis nor neural foraminal narrowing. L3-4: Small broad-based disc bulge asymmetric to the LEFT. No canal stenosis. Mild LEFT neural foraminal narrowing. L4-5: RIGHT hemilaminectomy. No canal stenosis. Moderate to severe LEFT greater than RIGHT neural foraminal narrowing. L5-S1: Posterior decompression. Small broad-based disc osteophyte complex. No canal stenosis. Mild neural foraminal narrowing. IMPRESSION: 1. Thoracal, lumbar and sacral posterior hardware results in artifact. 2. Acute moderate to severe L1 burst fracture. Known additional acute fractures better demonstrated on today's CT. 3. No canal stenosis. Status post RIGHT L4-5 hemilaminectomy and L5-S1 bilateral laminectomies. 4. Neural foraminal narrowing L3-4 through L5-S1: Moderate to severe at L4-5. Electronically Signed   By: CElon AlasM.D.   On: 12/05/2017 20:57   Ct Abdomen Pelvis W Contrast  Result Date: 12/06/2017 CLINICAL DATA:  77year old male with known abdominal aortic aneurysm presenting with back pain. EXAM: CT ABDOMEN AND PELVIS WITH CONTRAST TECHNIQUE: Multidetector CT imaging of the abdomen and pelvis was performed using the standard protocol following bolus administration of intravenous contrast. CONTRAST:  1068mOMNIPAQUE  IOHEXOL 300 MG/ML  SOLN COMPARISON:  CT of the lumbar spine dated 10/30/2017 FINDINGS: Lower chest: Partially visualized bilateral pleural effusions, right greater left with associated partial compressive atelectasis of the lower lobes. Pneumonia is not excluded. Clinical correlation is recommended. No intra-abdominal free air.  Small free fluid within the pelvis. Hepatobiliary: Cholecystectomy. Mild intrahepatic biliary ductal dilatation. The liver is unremarkable. Pancreas: Unremarkable. No pancreatic ductal  dilatation or surrounding inflammatory changes. Spleen: Normal in size without focal abnormality. Adrenals/Urinary Tract: The adrenal glands are unremarkable. Punctate nonobstructing right renal upper pole calculus. Mild bilateral renal parenchyma atrophy. There is no hydronephrosis on either side. There is symmetric enhancement and excretion of contrast by both kidneys. There is a 15 mm right renal upper pole cyst. The visualized ureters appear unremarkable. High attenuating content within the urinary bladder on corticomedullary phase of renal enhancement noted. Although this may represent excreted contrast, blood is not excluded. Correlation with hematuria recommended. Stomach/Bowel: There is sigmoid diverticulosis without active inflammatory changes. Moderate amount of stool noted throughout the colon. There is no bowel obstruction or active inflammation. Appendectomy. Vascular/Lymphatic: There is moderate aortoiliac atherosclerotic disease. There is a 2 cm infrarenal aortic ectasia. The IVC is unremarkable. No portal venous gas. There is no adenopathy. Reproductive: The prostate and seminal vesicles are grossly unremarkable as visualized. Other: Small amount of fluid in the right inguinal canal. Musculoskeletal: Osteopenia with L1 compression fracture approximately 50% loss of vertebral body height similar or slightly progressed since the prior CT. Approximately 5 mm retropulsion of the superior posterior cortex similar to prior CT. No new fracture. Lower thoracic as well as L4 vertebroplasty. Posterior thoracolumbar fixation hardware as seen on the prior CT. No acute osseous pathology. IMPRESSION: 1. Partially visualized bilateral pleural effusions with associated partial compressive atelectasis of the lower lobes. Pneumonia is not excluded. Clinical correlation is recommended. 2. High attenuating content within the urinary bladder in the early contrast enhancement of the kidneys (corticomedullary phase). This  is likely residual contrast from recent MRI. Clinical correlation is recommended to exclude hematuria. 3. A 2 cm infrarenal abdominal aortic ectasia. 4.  Aortic Atherosclerosis (ICD10-I70.0). 5. Small amount of free fluid within the pelvis of indeterminate etiology. 6. Constipation and colonic diverticulosis. No bowel obstruction or active inflammation. 7. Osteopenia with compression fracture of the L1 similar to prior CT. Multilevel vertebroplasties as well as thoracolumbar fusion similar to prior CT. Electronically Signed   By: Anner Crete M.D.   On: 12/06/2017 02:26    Impression/Recommendations Principal Problem:   Low back pain Active Problems:   Chronic kidney disease   Anemia   Abnormal CT scan of lung  Low back pain -s/p lumbar revision surgery for pseudoarthrosis -Ongoing significant back pain - although he was sleeping soundly and comfortably when I arrived -Imaging appears to indicate an acute moderate to severe L1 burst fracture with other acute fractures -Management as per neurosurgery  Abnormal CT -CT appeared to indicate a possible pneumonia -He does not have significant PE findings or historical information suggestive of PNA -Will check procalcitonin level  -Will encourage IS -Normal WBC count -No antibiotics at this time -He did have small pleural effusions in 6/19 on imaging; the right effusion may be getting worse but does not appear to causing obvious symptoms at this time and so likely does not need to be evaluated currently; consider outpatient pulm evaluation -No evidence of CHF  Anemia -Chronic anemia, thought to be related to iron deficiency and low testosterone -Treated as an  outpatient with IV iron, testosterone -Suggest transfusion if anemia continues to worsen  CKD -Appears to be stable at this time -Continue to follow    Thank you for this consultation.  Our [facility] hospitalist team will follow the patient with you.   Time Spent: 63  minutes  Karmen Bongo M.D. [facility] 12/06/2017, 11:49 AM

## 2017-12-06 NOTE — Care Management CC44 (Signed)
Condition Code 44 Documentation Completed  Patient Details  Name: AMEYA KUTZ MRN: [id number] Date of Birth: [birth year]   Condition Code 44 given:  Yes Patient signature on Condition Code 44 notice:  Yes Documentation of 2 MD's agreement:  Yes Code 44 added to claim:  Yes    Carles Collet, RN 12/06/2017, 1:55 PM

## 2017-12-07 DIAGNOSIS — M545 Low back pain: Secondary | ICD-10-CM | POA: Diagnosis not present

## 2017-12-07 MED ORDER — HYDROMORPHONE HCL 4 MG PO TABS: 4.0000 mg | ORAL_TABLET | Freq: Four times a day (QID) | ORAL | 0 refills | Status: DC | PRN

## 2017-12-07 MED ORDER — CYCLOBENZAPRINE HCL 10 MG PO TABS: 10.0000 mg | ORAL_TABLET | Freq: Two times a day (BID) | ORAL | 0 refills | Status: DC | PRN

## 2017-12-07 NOTE — Progress Notes (Signed)
PROGRESS NOTE    Bryan [name]  EGB:[id number] DOB: 1940/11/13 DOA: 12/05/2017 PCP: [name], MD   Brief Narrative: CALIN FANTROY is an 77 y.o. male with PMH of schizophrenia; RA; afib; dementia; HTN; HLD; chronic back pain; GERD; and CAD.  He was admitted last night for pain control for his back pain and overnight CT showed PNA.  Patient has chronic back pain and he had 2 back surgeries in May with Dr. Saintclair Halsted.  The screws dislodged after the first surgery.  On the second surgery, the screws were replaced and he had new ones put in.  He has had constant pain from this ongoing.  He denies any symptoms of pneumonia.  He has mild cough, slightly productive of clear phlegm.  No fevers.  No sick contacts.  He noticed LE edema about 2 days ago.  No weight changes.  No orthopnea.  No PND. Medicine consulted for possible pneumonia.    Assessment & Plan:   Principal Problem:   Low back pain Active Problems:   Chronic kidney disease   Anemia   Abnormal CT scan of lung  Suspected pneumonia: Patient does not have any clinical signs of pneumonia.  He is afebrile.  His white cell counts are stable.  His lungs are clear on auscultation today.  We will not start on antibiotics.    Low back pain -s/p lumbar revision surgery for pseudoarthrosis -Ongoing  back pain  -Imaging appears to indicate an acute moderate to severe L1 burst fracture with other acute fractures -Management as per neurosurgery.Plan for discharge today.  Anemia -Chronic anemia, thought to be related to iron deficiency and low testosterone -Treated as an outpatient with IV iron, testosterone -Suggest transfusion if anemia continues to worsen  CKD -Appears to be stable at this time   DC planning by neurosurgery after PT evaluation today. Medicine will sign off.  Will recommend to check CBC in a week as an outpatient.    Subjective: Patient seen and examined at the bedside this morning.  Looks  comfortable.  Respiratory status stable.  Saturating fine on room air.  Does not complain of any shortness of breath or chest pain.  Objective: Vitals:   12/06/17 2341 12/07/17 0406 12/07/17 0837 12/07/17 1143  BP: (!) 155/70 (!) 171/68 (!) 163/77 (!) 112/99  Pulse: 65 60 71 82  Resp: 20 20 20 20   Temp: 98.2 F (36.8 C) 97.6 F (36.4 C) 97.8 F (36.6 C) 98.1 F (36.7 C)  TempSrc: Oral Oral Oral Oral  SpO2: 90% 94% 96% 96%  Weight:      Height:        Intake/Output Summary (Last 24 hours) at 12/07/2017 1222 Last data filed at 12/07/2017 3710 Gross per 24 hour  Intake -  Output 1000 ml  Net -1000 ml   Filed Weights   12/05/17 0944  Weight: 83.5 kg (184 lb)    Examination:  General exam: Appears calm and comfortable ,Not in distress,average built HEENT:PERRL,Oral mucosa moist, Ear/Nose normal on gross exam Respiratory system: Bilaterally clear Cardiovascular system: S1 & S2 heard, RRR. No JVD, murmurs, rubs, gallops or clicks. No pedal edema. Gastrointestinal system: Abdomen is nondistended, soft and nontender. No organomegaly or masses felt. Normal bowel sounds heard. Central nervous system: Alert and oriented. No focal neurological deficits. Extremities: No edema, no clubbing ,no cyanosis, distal peripheral pulses palpable. Skin: No rashes, lesions or ulcers,no icterus ,no pallor MSK: Normal muscle bulk,tone ,power Psychiatry: Judgement and insight appear normal.  Mood & affect appropriate.     Data Reviewed: I have personally reviewed following labs and imaging studies  CBC: Recent Labs  Lab 12/05/17 0954  WBC 4.1  NEUTROABS 2.9  HGB 7.5*  HCT 25.0*  MCV 92.9  PLT 709   Basic Metabolic Panel: Recent Labs  Lab 12/05/17 0954  NA 139  K 4.2  CL 107  CO2 24  GLUCOSE 91  BUN 24*  CREATININE 1.34*  CALCIUM 8.8*   GFR: Estimated Creatinine Clearance: 47.6 mL/min (A) (by C-G formula based on SCr of 1.34 mg/dL (H)). Liver Function Tests: No results for  input(s): AST, ALT, ALKPHOS, BILITOT, PROT, ALBUMIN in the last 168 hours. No results for input(s): LIPASE, AMYLASE in the last 168 hours. No results for input(s): AMMONIA in the last 168 hours. Coagulation Profile: No results for input(s): INR, PROTIME in the last 168 hours. Cardiac Enzymes: No results for input(s): CKTOTAL, CKMB, CKMBINDEX, TROPONINI in the last 168 hours. BNP (last 3 results) No results for input(s): PROBNP in the last 8760 hours. HbA1C: No results for input(s): HGBA1C in the last 72 hours. CBG: No results for input(s): GLUCAP in the last 168 hours. Lipid Profile: No results for input(s): CHOL, HDL, LDLCALC, TRIG, CHOLHDL, LDLDIRECT in the last 72 hours. Thyroid Function Tests: No results for input(s): TSH, T4TOTAL, FREET4, T3FREE, THYROIDAB in the last 72 hours. Anemia Panel: No results for input(s): VITAMINB12, FOLATE, FERRITIN, TIBC, IRON, RETICCTPCT in the last 72 hours. Sepsis Labs: Recent Labs  Lab 12/06/17 1156  PROCALCITON <0.10    No results found for this or any previous visit (from the past 240 hour(s)).       Radiology Studies: Ct Thoracic Spine Wo Contrast  Result Date: 12/05/2017 CLINICAL DATA:  Increasing back pain, status post spinal hardware revision Sep 28, 2017 and Oct 20, 2017. EXAM: CT THORACIC AND LUMBAR SPINE WITHOUT CONTRAST TECHNIQUE: Multidetector CT imaging of the thoracic and lumbar spine was performed without contrast. Multiplanar CT image reconstructions were also generated. COMPARISON:  CT thoracic spine October 30, 2017, CT thoracic spine Oct 19, 2017, thoracolumbar spine radiographs November 22, 2017, lumbar spine radiograph Oct 11, 2017. FINDINGS: CT THORACIC SPINE FINDINGS ALIGNMENT: Maintained thoracic kyphosis. No malalignment. VERTEBRAE: Vertebral bodies intact. Status post RIGHT T9-10 hemilaminectomy. Status post T10 through T12 vertebral body cement augmentation, unchanged. Similar cement material within ventral epidural splay/fat  since plexus extending to T8-9. Intact T10 through T12 well-seated pedicle screws. Not incorporated posterior bone graft material. Osteopenia without destructive bony lesions. PARASPINAL AND OTHER SOFT TISSUES: Moderate RIGHT and small LEFT pleural effusions. Patchy ground-glass opacities and upper lobe consolidation. Partially imaged cardiomegaly and pulmonary vascular congestion. Cement casting RIGHT upper lobe. DISC LEVELS: No canal stenosis. Cement extruded into T9-10 and T11 neural foramen. Severe RIGHT T8-9, RIGHT T9-10 neural foraminal narrowing. Moderate T9-10 through T12-L1 neural foraminal narrowing. CT LUMBAR SPINE FINDINGS SEGMENTATION: Continued numbering from thoracic levels. ALIGNMENT: Maintained lumbar lordosis. No malalignment. Mild dextroscoliosis on this nonweightbearing examination. VERTEBRAE: Moderate to severe L1 burst fracture (10 mm residual bone in craniocaudad dimension, previously 13 mm), 5 mm retropulsed bony fragments. Acute versus subacute LEFT L2 displaced transverse process fracture. Old LEFT L3 transverse process fracture. Acute LEFT L5 transverse process and LEFT sacrum (zone 1) fractures. Old moderate L4 compression fracture, status post cement augmentation with cement in the L4-5 disc space. L2, L5 and S1 bilateral intact well-seated pedicle screws and bridging rods. L5-S1 arthrodesis. L3 pedicle screw tract. Osteopenia without destructive bony  lesions. PARASPINAL AND OTHER SOFT TISSUES: Nonacute. 2 mm RIGHT upper pole nephrolithiasis. Partially imaged exophytic cyst RIGHT kidney. Status post cholecystectomy. Moderate aortoiliac calcifications. DISC LEVELS: T12-L1: Retropulsed bony fragments resulting in mild canal stenosis. Moderate neural foraminal narrowing. L1-2: No disc bulge, canal stenosis nor neural foraminal narrowing. L2-3: No disc bulge, canal stenosis nor neural foraminal narrowing. L3-4: Small broad-based disc osteophyte complex. Mild LEFT facet arthropathy. No canal  stenosis. Mild-to-moderate LEFT neural foraminal narrowing. L4-5: RIGHT hemilaminectomy. No disc bulge. No canal stenosis. Moderate to severe RIGHT, moderate LEFT neural foraminal narrowing. L5-S1: Posterior decompression. No canal stenosis. Moderate bilateral neural foraminal narrowing. IMPRESSION: CT THORACIC SPINE IMPRESSION 1. T10 through T12 posterior instrumentation and cement augmentation, unchanged. RIGHT T9-10 hemilaminectomy. 2. No fracture or malalignment. 3. Moderate RIGHT and small LEFT pleural effusions. Multifocal consolidation seen with pneumonia or focal pulmonary edema. 4. Cardiomegaly and pulmonary vascular congestion. CT LUMBAR SPINE IMPRESSION 1. Moderate to severe L1 burst fracture, further height loss from Oct 19, 2017. Acute LEFT L5 and LEFT sacrum nondisplaced fractures. Acute versus subacute LEFT L2 transverse process fracture. 2. Old moderate L4 compression fracture, status post cement augmentation. 3. Status post L2 through S1 fusion, L5-S1 arthrodesis. RIGHT L4-5 hemilaminectomy. Non incorporated posterior bone graft material. 4. No canal stenosis. Neural foraminal narrowing T12-L1, L3-4 through L5-S1: Moderate to severe on the RIGHT at L4-5. Aortic Atherosclerosis (ICD10-I70.0). Electronically Signed   By: Elon Alas M.D.   On: 12/05/2017 17:50   Ct Lumbar Spine Wo Contrast  Result Date: 12/05/2017 CLINICAL DATA:  Increasing back pain, status post spinal hardware revision Sep 28, 2017 and Oct 20, 2017. EXAM: CT THORACIC AND LUMBAR SPINE WITHOUT CONTRAST TECHNIQUE: Multidetector CT imaging of the thoracic and lumbar spine was performed without contrast. Multiplanar CT image reconstructions were also generated. COMPARISON:  CT thoracic spine October 30, 2017, CT thoracic spine Oct 19, 2017, thoracolumbar spine radiographs November 22, 2017, lumbar spine radiograph Oct 11, 2017. FINDINGS: CT THORACIC SPINE FINDINGS ALIGNMENT: Maintained thoracic kyphosis. No malalignment. VERTEBRAE:  Vertebral bodies intact. Status post RIGHT T9-10 hemilaminectomy. Status post T10 through T12 vertebral body cement augmentation, unchanged. Similar cement material within ventral epidural splay/fat since plexus extending to T8-9. Intact T10 through T12 well-seated pedicle screws. Not incorporated posterior bone graft material. Osteopenia without destructive bony lesions. PARASPINAL AND OTHER SOFT TISSUES: Moderate RIGHT and small LEFT pleural effusions. Patchy ground-glass opacities and upper lobe consolidation. Partially imaged cardiomegaly and pulmonary vascular congestion. Cement casting RIGHT upper lobe. DISC LEVELS: No canal stenosis. Cement extruded into T9-10 and T11 neural foramen. Severe RIGHT T8-9, RIGHT T9-10 neural foraminal narrowing. Moderate T9-10 through T12-L1 neural foraminal narrowing. CT LUMBAR SPINE FINDINGS SEGMENTATION: Continued numbering from thoracic levels. ALIGNMENT: Maintained lumbar lordosis. No malalignment. Mild dextroscoliosis on this nonweightbearing examination. VERTEBRAE: Moderate to severe L1 burst fracture (10 mm residual bone in craniocaudad dimension, previously 13 mm), 5 mm retropulsed bony fragments. Acute versus subacute LEFT L2 displaced transverse process fracture. Old LEFT L3 transverse process fracture. Acute LEFT L5 transverse process and LEFT sacrum (zone 1) fractures. Old moderate L4 compression fracture, status post cement augmentation with cement in the L4-5 disc space. L2, L5 and S1 bilateral intact well-seated pedicle screws and bridging rods. L5-S1 arthrodesis. L3 pedicle screw tract. Osteopenia without destructive bony lesions. PARASPINAL AND OTHER SOFT TISSUES: Nonacute. 2 mm RIGHT upper pole nephrolithiasis. Partially imaged exophytic cyst RIGHT kidney. Status post cholecystectomy. Moderate aortoiliac calcifications. DISC LEVELS: T12-L1: Retropulsed bony fragments resulting in mild canal stenosis.  Moderate neural foraminal narrowing. L1-2: No disc bulge,  canal stenosis nor neural foraminal narrowing. L2-3: No disc bulge, canal stenosis nor neural foraminal narrowing. L3-4: Small broad-based disc osteophyte complex. Mild LEFT facet arthropathy. No canal stenosis. Mild-to-moderate LEFT neural foraminal narrowing. L4-5: RIGHT hemilaminectomy. No disc bulge. No canal stenosis. Moderate to severe RIGHT, moderate LEFT neural foraminal narrowing. L5-S1: Posterior decompression. No canal stenosis. Moderate bilateral neural foraminal narrowing. IMPRESSION: CT THORACIC SPINE IMPRESSION 1. T10 through T12 posterior instrumentation and cement augmentation, unchanged. RIGHT T9-10 hemilaminectomy. 2. No fracture or malalignment. 3. Moderate RIGHT and small LEFT pleural effusions. Multifocal consolidation seen with pneumonia or focal pulmonary edema. 4. Cardiomegaly and pulmonary vascular congestion. CT LUMBAR SPINE IMPRESSION 1. Moderate to severe L1 burst fracture, further height loss from Oct 19, 2017. Acute LEFT L5 and LEFT sacrum nondisplaced fractures. Acute versus subacute LEFT L2 transverse process fracture. 2. Old moderate L4 compression fracture, status post cement augmentation. 3. Status post L2 through S1 fusion, L5-S1 arthrodesis. RIGHT L4-5 hemilaminectomy. Non incorporated posterior bone graft material. 4. No canal stenosis. Neural foraminal narrowing T12-L1, L3-4 through L5-S1: Moderate to severe on the RIGHT at L4-5. Aortic Atherosclerosis (ICD10-I70.0). Electronically Signed   By: Elon Alas M.D.   On: 12/05/2017 17:50   Mr Lumbar Spine W Wo Contrast  Result Date: 12/05/2017 CLINICAL DATA:  Worsening upper and mid back pain since spine revision surgery Oct 20, 2017. EXAM: MRI LUMBAR SPINE WITHOUT AND WITH CONTRAST TECHNIQUE: Multiplanar and multiecho pulse sequences of the lumbar spine were obtained without and with intravenous contrast. CONTRAST:  71mL MULTIHANCE GADOBENATE DIMEGLUMINE 529 MG/ML IV SOLN COMPARISON:  CT lumbar spine December 05, 2017  FINDINGS: SEGMENTATION: For the purposes of this report, the last well-formed intervertebral disc is reported as L5-S1. ALIGNMENT: Maintained lumbar lordosis. No malalignment. VERTEBRAE:Examination limited by hardware artifact. Status post thoracolumbar and sacral posterior instrumentation. Moderate to severe acute L1 burst fracture with residual fracture line, associated low T1 and bright STIR signal. No suspicious enhancement. No new additional acute fractures less conspicuous. Old moderate L4 compression fracture, central low signal consistent with cement augmentation. Multilevel mild disc desiccation. Moderate L5-S1 disc height loss with arthrodesis better demonstrated on today's CT. CONUS MEDULLARIS AND CAUDA EQUINA: Conus medullaris obscured by hardware artifact. Normal appearing cauda equina at lumbosacral junction. No suspicious leptomeningeal nor epidural enhancement though limited by hardware artifact. PARASPINAL AND OTHER SOFT TISSUES: Nonacute. Postoperative paraspinal muscle denervation. DISC LEVELS: L1-2: Obscured on axial sequences. No canal stenosis and neural foraminal at and on sagittal series. L2-3: No disc bulge, canal stenosis nor neural foraminal narrowing. L3-4: Small broad-based disc bulge asymmetric to the LEFT. No canal stenosis. Mild LEFT neural foraminal narrowing. L4-5: RIGHT hemilaminectomy. No canal stenosis. Moderate to severe LEFT greater than RIGHT neural foraminal narrowing. L5-S1: Posterior decompression. Small broad-based disc osteophyte complex. No canal stenosis. Mild neural foraminal narrowing. IMPRESSION: 1. Thoracal, lumbar and sacral posterior hardware results in artifact. 2. Acute moderate to severe L1 burst fracture. Known additional acute fractures better demonstrated on today's CT. 3. No canal stenosis. Status post RIGHT L4-5 hemilaminectomy and L5-S1 bilateral laminectomies. 4. Neural foraminal narrowing L3-4 through L5-S1: Moderate to severe at L4-5. Electronically  Signed   By: Elon Alas M.D.   On: 12/05/2017 20:57   Ct Abdomen Pelvis W Contrast  Result Date: 12/06/2017 CLINICAL DATA:  77 year old male with known abdominal aortic aneurysm presenting with back pain. EXAM: CT ABDOMEN AND PELVIS WITH CONTRAST TECHNIQUE: Multidetector CT imaging of the  abdomen and pelvis was performed using the standard protocol following bolus administration of intravenous contrast. CONTRAST:  175mL OMNIPAQUE IOHEXOL 300 MG/ML  SOLN COMPARISON:  CT of the lumbar spine dated 10/30/2017 FINDINGS: Lower chest: Partially visualized bilateral pleural effusions, right greater left with associated partial compressive atelectasis of the lower lobes. Pneumonia is not excluded. Clinical correlation is recommended. No intra-abdominal free air.  Small free fluid within the pelvis. Hepatobiliary: Cholecystectomy. Mild intrahepatic biliary ductal dilatation. The liver is unremarkable. Pancreas: Unremarkable. No pancreatic ductal dilatation or surrounding inflammatory changes. Spleen: Normal in size without focal abnormality. Adrenals/Urinary Tract: The adrenal glands are unremarkable. Punctate nonobstructing right renal upper pole calculus. Mild bilateral renal parenchyma atrophy. There is no hydronephrosis on either side. There is symmetric enhancement and excretion of contrast by both kidneys. There is a 15 mm right renal upper pole cyst. The visualized ureters appear unremarkable. High attenuating content within the urinary bladder on corticomedullary phase of renal enhancement noted. Although this may represent excreted contrast, blood is not excluded. Correlation with hematuria recommended. Stomach/Bowel: There is sigmoid diverticulosis without active inflammatory changes. Moderate amount of stool noted throughout the colon. There is no bowel obstruction or active inflammation. Appendectomy. Vascular/Lymphatic: There is moderate aortoiliac atherosclerotic disease. There is a 2 cm infrarenal  aortic ectasia. The IVC is unremarkable. No portal venous gas. There is no adenopathy. Reproductive: The prostate and seminal vesicles are grossly unremarkable as visualized. Other: Small amount of fluid in the right inguinal canal. Musculoskeletal: Osteopenia with L1 compression fracture approximately 50% loss of vertebral body height similar or slightly progressed since the prior CT. Approximately 5 mm retropulsion of the superior posterior cortex similar to prior CT. No new fracture. Lower thoracic as well as L4 vertebroplasty. Posterior thoracolumbar fixation hardware as seen on the prior CT. No acute osseous pathology. IMPRESSION: 1. Partially visualized bilateral pleural effusions with associated partial compressive atelectasis of the lower lobes. Pneumonia is not excluded. Clinical correlation is recommended. 2. High attenuating content within the urinary bladder in the early contrast enhancement of the kidneys (corticomedullary phase). This is likely residual contrast from recent MRI. Clinical correlation is recommended to exclude hematuria. 3. A 2 cm infrarenal abdominal aortic ectasia. 4.  Aortic Atherosclerosis (ICD10-I70.0). 5. Small amount of free fluid within the pelvis of indeterminate etiology. 6. Constipation and colonic diverticulosis. No bowel obstruction or active inflammation. 7. Osteopenia with compression fracture of the L1 similar to prior CT. Multilevel vertebroplasties as well as thoracolumbar fusion similar to prior CT. Electronically Signed   By: Anner Crete M.D.   On: 12/06/2017 02:26        Scheduled Meds: . amLODipine  5 mg Oral Daily  . aspirin  325 mg Oral Q1400  . atorvastatin  40 mg Oral Daily  . docusate sodium  100 mg Oral BID  . donepezil  5 mg Oral QHS  . fenofibrate  160 mg Oral Daily  . folic acid  1 mg Oral BID  . gabapentin  400 mg Oral TID  . lisinopril  20 mg Oral QPM  . loratadine  10 mg Oral Daily  . mirabegron ER  50 mg Oral Daily  .  pantoprazole  40 mg Oral Daily  . senna  1 tablet Oral BID  . tamsulosin  0.4 mg Oral Daily  . traZODone  100 mg Oral QHS   Continuous Infusions:   LOS: 1 day    Time spent: 25 mins.      Shelly Coss, MD [facility] Pager [phone]  If 7PM-7AM, please contact night-coverage [email or web address] Password [facility] 12/07/2017, 12:22 PM

## 2017-12-07 NOTE — Evaluation (Signed)
Physical Therapy Evaluation Patient Details Name: Bryan [name] MRN: [id number] DOB: [birth year] Today's Date: 12/07/2017   History of Present Illness  Patient is a 77 y/o male presenting with back pain. Of note, apteint with recent lumbar revision surgery for pseudoarthrosis. Per MD, CT scans negative. Patient with a PMH significant for schizophrenia; RA; afib; dementia; HTN; HLD; chronic back pain; GERD; and CAD.   Clinical Impression  [name] is a very pleasant 77 y/o male presenting with back pain. Patient reporting that prior to admission, he lived at home with his wife and was recently discharged from rehab after prior back surgery. Patient today only requiring min guard to supervision for all mobility for general safety. Does have a slightly unsteady gait pattern with reliance on RW for stability, but reports this as his "new normal" following surgery. PT currently recommending HHPT with supervision to continue to progress mobility in the home environment.     Follow Up Recommendations Home health PT;Supervision - Intermittent    Equipment Recommendations  None recommended by PT    Recommendations for Other Services       Precautions / Restrictions Precautions Precautions: Fall Required Braces or Orthoses: Spinal Brace Spinal Brace: Lumbar corset;Applied in sitting position Restrictions Weight Bearing Restrictions: No      Mobility  Bed Mobility               General bed mobility comments: on toilet upon PT arrival  Transfers Overall transfer level: Needs assistance Equipment used: None Transfers: Sit to/from Stand Sit to Stand: Min guard;Supervision         General transfer comment: for safety adn immediate standing balance  Ambulation/Gait Ambulation/Gait assistance: Min guard Gait Distance (Feet): 100 Feet Assistive device: Rolling walker (2 wheeled) Gait Pattern/deviations: Step-through pattern;Decreased stride length;Trunk flexed Gait  velocity: decreased   General Gait Details: patient reporting this is his baseline s/p lumber sx and returning home from SNF  Stairs            Wheelchair Mobility    Modified Rankin (Stroke Patients Only)       Balance Overall balance assessment: Mild deficits observed, not formally tested                                           Pertinent Vitals/Pain Pain Assessment: Faces Faces Pain Scale: Hurts a little bit Pain Location: low back Pain Descriptors / Indicators: Grimacing Pain Intervention(s): Limited activity within patient's tolerance;Monitored during session;Repositioned    Home Living Family/patient expects to be discharged to:: Private residence Living Arrangements: Spouse/significant other Available Help at Discharge: Family;Available PRN/intermittently Type of Home: (condo) Home Access: Level entry     Home Layout: One level Home Equipment: Grab bars - tub/shower;Shower seat;Bedside commode;Walker - 2 wheels;Cane - single point;Adaptive equipment      Prior Function Level of Independence: Independent with assistive device(s)         Comments: recently d/c from SNF due to prior surgery; was using walker in home adn receiving HHPT     Hand Dominance   Dominant Hand: Right    Extremity/Trunk Assessment        Lower Extremity Assessment Lower Extremity Assessment: Generalized weakness    Cervical / Trunk Assessment Cervical / Trunk Assessment: Kyphotic  Communication   Communication: No difficulties  Cognition Arousal/Alertness: Awake/alert Behavior During Therapy: WFL for tasks assessed/performed Overall Cognitive Status:  Within Functional Limits for tasks assessed                                        General Comments      Exercises     Assessment/Plan    PT Assessment Patient needs continued PT services  PT Problem List Decreased strength;Decreased range of motion;Decreased activity  tolerance;Decreased balance;Decreased mobility;Decreased coordination;Decreased safety awareness       PT Treatment Interventions DME instruction;Gait training;Functional mobility training;Therapeutic activities;Therapeutic exercise;Balance training;Neuromuscular re-education;Patient/family education    PT Goals (Current goals can be found in the Care Plan section)  Acute Rehab PT Goals Patient Stated Goal: return home PT Goal Formulation: With patient Time For Goal Achievement: 12/14/17 Potential to Achieve Goals: Good    Frequency Min 3X/week   Barriers to discharge        Co-evaluation               AM-PAC PT "6 Clicks" Daily Activity  Outcome Measure Difficulty turning over in bed (including adjusting bedclothes, sheets and blankets)?: A Little Difficulty moving from lying on back to sitting on the side of the bed? : A Little Difficulty sitting down on and standing up from a chair with arms (e.g., wheelchair, bedside commode, etc,.)?: Unable Help needed moving to and from a bed to chair (including a wheelchair)?: A Little Help needed walking in hospital room?: A Little Help needed climbing 3-5 steps with a railing? : A Lot 6 Click Score: 15    End of Session Equipment Utilized During Treatment: Gait belt;Back brace Activity Tolerance: Patient tolerated treatment well Patient left: in bed;with call bell/phone within reach Nurse Communication: Mobility status PT Visit Diagnosis: Unsteadiness on feet (R26.81);Other abnormalities of gait and mobility (R26.89);Muscle weakness (generalized) (M62.81)    Time: 1435-1450 PT Time Calculation (min) (ACUTE ONLY): 15 min   Charges:   PT Evaluation $PT Eval Moderate Complexity: 1 Mod     PT G Codes:        Lanney Gins, PT, DPT 12/07/17 3:04 PM Pager: [phone]

## 2017-12-07 NOTE — Progress Notes (Signed)
3x trying to contact PT by phone to help schedule a therapist to evaluate pt for discharge. Dialed [phone] and [phone] with no response. Left message on answering machine.

## 2017-12-07 NOTE — Discharge Summary (Signed)
Physician Discharge Summary  Patient ID: Bryan [name] MRN: [id number] DOB/AGE: [birth year] 77 y.o.  Admit date: 12/05/2017 Discharge date: 12/07/2017  Admission Diagnoses: back pain   Discharge Diagnoses: back pain   Discharged Condition: good  Hospital Course: The patient was admitted on 12/05/2017 for lower back pain. The patientwas taken to floor in stable condition. The hospital course was routine. There were no complications.  Pt had low back pain. No complaints of leg pain or new N/T/W. The patient remained afebrile with stable vital signs, and tolerated a regular diet. The patient continued to increase activities, and pain was well controlled with oral pain medications. Sed rate was elevated but no pathology noted on MRI or CT scan  Consults: None  Significant Diagnostic Studies:  Results for orders placed or performed during the hospital encounter of 12/05/17  CBC with Differential  Result Value Ref Range   WBC 4.1 4.0 - 10.5 K/uL   RBC 2.69 (L) 4.22 - 5.81 MIL/uL   Hemoglobin 7.5 (L) 13.0 - 17.0 g/dL   HCT 25.0 (L) 39.0 - 52.0 %   MCV 92.9 78.0 - 100.0 fL   MCH 27.9 26.0 - 34.0 pg   MCHC 30.0 30.0 - 36.0 g/dL   RDW 16.9 (H) 11.5 - 15.5 %   Platelets 164 150 - 400 K/uL   Neutrophils Relative % 69 %   Neutro Abs 2.9 1.7 - 7.7 K/uL   Lymphocytes Relative 15 %   Lymphs Abs 0.6 (L) 0.7 - 4.0 K/uL   Monocytes Relative 12 %   Monocytes Absolute 0.5 0.1 - 1.0 K/uL   Eosinophils Relative 3 %   Eosinophils Absolute 0.1 0.0 - 0.7 K/uL   Basophils Relative 0 %   Basophils Absolute 0.0 0.0 - 0.1 K/uL   Immature Granulocytes 1 %   Abs Immature Granulocytes 0.0 0.0 - 0.1 K/uL  Basic metabolic panel  Result Value Ref Range   Sodium 139 135 - 145 mmol/L   Potassium 4.2 3.5 - 5.1 mmol/L   Chloride 107 98 - 111 mmol/L   CO2 24 22 - 32 mmol/L   Glucose, Bld 91 70 - 99 mg/dL   BUN 24 (H) 8 - 23 mg/dL   Creatinine, Ser 1.34 (H) 0.61 - 1.24 mg/dL   Calcium 8.8 (L) 8.9 - 10.3  mg/dL   GFR calc non Af Amer 49 (L) >60 mL/min   GFR calc Af Amer 56 (L) >60 mL/min   Anion gap 8 5 - 15  Sedimentation rate  Result Value Ref Range   Sed Rate 64 (H) 0 - 16 mm/hr  C-reactive protein  Result Value Ref Range   CRP 8.8 (H) <1.0 mg/dL  Procalcitonin - Baseline  Result Value Ref Range   Procalcitonin <0.10 ng/mL    Ct Thoracic Spine Wo Contrast  Result Date: 12/05/2017 CLINICAL DATA:  Increasing back pain, status post spinal hardware revision Sep 28, 2017 and Oct 20, 2017. EXAM: CT THORACIC AND LUMBAR SPINE WITHOUT CONTRAST TECHNIQUE: Multidetector CT imaging of the thoracic and lumbar spine was performed without contrast. Multiplanar CT image reconstructions were also generated. COMPARISON:  CT thoracic spine October 30, 2017, CT thoracic spine Oct 19, 2017, thoracolumbar spine radiographs November 22, 2017, lumbar spine radiograph Oct 11, 2017. FINDINGS: CT THORACIC SPINE FINDINGS ALIGNMENT: Maintained thoracic kyphosis. No malalignment. VERTEBRAE: Vertebral bodies intact. Status post RIGHT T9-10 hemilaminectomy. Status post T10 through T12 vertebral body cement augmentation, unchanged. Similar cement material within ventral epidural splay/fat since plexus  extending to T8-9. Intact T10 through T12 well-seated pedicle screws. Not incorporated posterior bone graft material. Osteopenia without destructive bony lesions. PARASPINAL AND OTHER SOFT TISSUES: Moderate RIGHT and small LEFT pleural effusions. Patchy ground-glass opacities and upper lobe consolidation. Partially imaged cardiomegaly and pulmonary vascular congestion. Cement casting RIGHT upper lobe. DISC LEVELS: No canal stenosis. Cement extruded into T9-10 and T11 neural foramen. Severe RIGHT T8-9, RIGHT T9-10 neural foraminal narrowing. Moderate T9-10 through T12-L1 neural foraminal narrowing. CT LUMBAR SPINE FINDINGS SEGMENTATION: Continued numbering from thoracic levels. ALIGNMENT: Maintained lumbar lordosis. No malalignment. Mild  dextroscoliosis on this nonweightbearing examination. VERTEBRAE: Moderate to severe L1 burst fracture (10 mm residual bone in craniocaudad dimension, previously 13 mm), 5 mm retropulsed bony fragments. Acute versus subacute LEFT L2 displaced transverse process fracture. Old LEFT L3 transverse process fracture. Acute LEFT L5 transverse process and LEFT sacrum (zone 1) fractures. Old moderate L4 compression fracture, status post cement augmentation with cement in the L4-5 disc space. L2, L5 and S1 bilateral intact well-seated pedicle screws and bridging rods. L5-S1 arthrodesis. L3 pedicle screw tract. Osteopenia without destructive bony lesions. PARASPINAL AND OTHER SOFT TISSUES: Nonacute. 2 mm RIGHT upper pole nephrolithiasis. Partially imaged exophytic cyst RIGHT kidney. Status post cholecystectomy. Moderate aortoiliac calcifications. DISC LEVELS: T12-L1: Retropulsed bony fragments resulting in mild canal stenosis. Moderate neural foraminal narrowing. L1-2: No disc bulge, canal stenosis nor neural foraminal narrowing. L2-3: No disc bulge, canal stenosis nor neural foraminal narrowing. L3-4: Small broad-based disc osteophyte complex. Mild LEFT facet arthropathy. No canal stenosis. Mild-to-moderate LEFT neural foraminal narrowing. L4-5: RIGHT hemilaminectomy. No disc bulge. No canal stenosis. Moderate to severe RIGHT, moderate LEFT neural foraminal narrowing. L5-S1: Posterior decompression. No canal stenosis. Moderate bilateral neural foraminal narrowing. IMPRESSION: CT THORACIC SPINE IMPRESSION 1. T10 through T12 posterior instrumentation and cement augmentation, unchanged. RIGHT T9-10 hemilaminectomy. 2. No fracture or malalignment. 3. Moderate RIGHT and small LEFT pleural effusions. Multifocal consolidation seen with pneumonia or focal pulmonary edema. 4. Cardiomegaly and pulmonary vascular congestion. CT LUMBAR SPINE IMPRESSION 1. Moderate to severe L1 burst fracture, further height loss from Oct 19, 2017. Acute  LEFT L5 and LEFT sacrum nondisplaced fractures. Acute versus subacute LEFT L2 transverse process fracture. 2. Old moderate L4 compression fracture, status post cement augmentation. 3. Status post L2 through S1 fusion, L5-S1 arthrodesis. RIGHT L4-5 hemilaminectomy. Non incorporated posterior bone graft material. 4. No canal stenosis. Neural foraminal narrowing T12-L1, L3-4 through L5-S1: Moderate to severe on the RIGHT at L4-5. Aortic Atherosclerosis (ICD10-I70.0). Electronically Signed   By: Elon Alas M.D.   On: 12/05/2017 17:50   Ct Lumbar Spine Wo Contrast  Result Date: 12/05/2017 CLINICAL DATA:  Increasing back pain, status post spinal hardware revision Sep 28, 2017 and Oct 20, 2017. EXAM: CT THORACIC AND LUMBAR SPINE WITHOUT CONTRAST TECHNIQUE: Multidetector CT imaging of the thoracic and lumbar spine was performed without contrast. Multiplanar CT image reconstructions were also generated. COMPARISON:  CT thoracic spine October 30, 2017, CT thoracic spine Oct 19, 2017, thoracolumbar spine radiographs November 22, 2017, lumbar spine radiograph Oct 11, 2017. FINDINGS: CT THORACIC SPINE FINDINGS ALIGNMENT: Maintained thoracic kyphosis. No malalignment. VERTEBRAE: Vertebral bodies intact. Status post RIGHT T9-10 hemilaminectomy. Status post T10 through T12 vertebral body cement augmentation, unchanged. Similar cement material within ventral epidural splay/fat since plexus extending to T8-9. Intact T10 through T12 well-seated pedicle screws. Not incorporated posterior bone graft material. Osteopenia without destructive bony lesions. PARASPINAL AND OTHER SOFT TISSUES: Moderate RIGHT and small LEFT pleural effusions. Patchy ground-glass opacities  and upper lobe consolidation. Partially imaged cardiomegaly and pulmonary vascular congestion. Cement casting RIGHT upper lobe. DISC LEVELS: No canal stenosis. Cement extruded into T9-10 and T11 neural foramen. Severe RIGHT T8-9, RIGHT T9-10 neural foraminal narrowing.  Moderate T9-10 through T12-L1 neural foraminal narrowing. CT LUMBAR SPINE FINDINGS SEGMENTATION: Continued numbering from thoracic levels. ALIGNMENT: Maintained lumbar lordosis. No malalignment. Mild dextroscoliosis on this nonweightbearing examination. VERTEBRAE: Moderate to severe L1 burst fracture (10 mm residual bone in craniocaudad dimension, previously 13 mm), 5 mm retropulsed bony fragments. Acute versus subacute LEFT L2 displaced transverse process fracture. Old LEFT L3 transverse process fracture. Acute LEFT L5 transverse process and LEFT sacrum (zone 1) fractures. Old moderate L4 compression fracture, status post cement augmentation with cement in the L4-5 disc space. L2, L5 and S1 bilateral intact well-seated pedicle screws and bridging rods. L5-S1 arthrodesis. L3 pedicle screw tract. Osteopenia without destructive bony lesions. PARASPINAL AND OTHER SOFT TISSUES: Nonacute. 2 mm RIGHT upper pole nephrolithiasis. Partially imaged exophytic cyst RIGHT kidney. Status post cholecystectomy. Moderate aortoiliac calcifications. DISC LEVELS: T12-L1: Retropulsed bony fragments resulting in mild canal stenosis. Moderate neural foraminal narrowing. L1-2: No disc bulge, canal stenosis nor neural foraminal narrowing. L2-3: No disc bulge, canal stenosis nor neural foraminal narrowing. L3-4: Small broad-based disc osteophyte complex. Mild LEFT facet arthropathy. No canal stenosis. Mild-to-moderate LEFT neural foraminal narrowing. L4-5: RIGHT hemilaminectomy. No disc bulge. No canal stenosis. Moderate to severe RIGHT, moderate LEFT neural foraminal narrowing. L5-S1: Posterior decompression. No canal stenosis. Moderate bilateral neural foraminal narrowing. IMPRESSION: CT THORACIC SPINE IMPRESSION 1. T10 through T12 posterior instrumentation and cement augmentation, unchanged. RIGHT T9-10 hemilaminectomy. 2. No fracture or malalignment. 3. Moderate RIGHT and small LEFT pleural effusions. Multifocal consolidation seen with  pneumonia or focal pulmonary edema. 4. Cardiomegaly and pulmonary vascular congestion. CT LUMBAR SPINE IMPRESSION 1. Moderate to severe L1 burst fracture, further height loss from Oct 19, 2017. Acute LEFT L5 and LEFT sacrum nondisplaced fractures. Acute versus subacute LEFT L2 transverse process fracture. 2. Old moderate L4 compression fracture, status post cement augmentation. 3. Status post L2 through S1 fusion, L5-S1 arthrodesis. RIGHT L4-5 hemilaminectomy. Non incorporated posterior bone graft material. 4. No canal stenosis. Neural foraminal narrowing T12-L1, L3-4 through L5-S1: Moderate to severe on the RIGHT at L4-5. Aortic Atherosclerosis (ICD10-I70.0). Electronically Signed   By: Elon Alas M.D.   On: 12/05/2017 17:50   Mr Lumbar Spine W Wo Contrast  Result Date: 12/05/2017 CLINICAL DATA:  Worsening upper and mid back pain since spine revision surgery Oct 20, 2017. EXAM: MRI LUMBAR SPINE WITHOUT AND WITH CONTRAST TECHNIQUE: Multiplanar and multiecho pulse sequences of the lumbar spine were obtained without and with intravenous contrast. CONTRAST:  41mL MULTIHANCE GADOBENATE DIMEGLUMINE 529 MG/ML IV SOLN COMPARISON:  CT lumbar spine December 05, 2017 FINDINGS: SEGMENTATION: For the purposes of this report, the last well-formed intervertebral disc is reported as L5-S1. ALIGNMENT: Maintained lumbar lordosis. No malalignment. VERTEBRAE:Examination limited by hardware artifact. Status post thoracolumbar and sacral posterior instrumentation. Moderate to severe acute L1 burst fracture with residual fracture line, associated low T1 and bright STIR signal. No suspicious enhancement. No new additional acute fractures less conspicuous. Old moderate L4 compression fracture, central low signal consistent with cement augmentation. Multilevel mild disc desiccation. Moderate L5-S1 disc height loss with arthrodesis better demonstrated on today's CT. CONUS MEDULLARIS AND CAUDA EQUINA: Conus medullaris obscured by  hardware artifact. Normal appearing cauda equina at lumbosacral junction. No suspicious leptomeningeal nor epidural enhancement though limited by hardware artifact. PARASPINAL AND OTHER  SOFT TISSUES: Nonacute. Postoperative paraspinal muscle denervation. DISC LEVELS: L1-2: Obscured on axial sequences. No canal stenosis and neural foraminal at and on sagittal series. L2-3: No disc bulge, canal stenosis nor neural foraminal narrowing. L3-4: Small broad-based disc bulge asymmetric to the LEFT. No canal stenosis. Mild LEFT neural foraminal narrowing. L4-5: RIGHT hemilaminectomy. No canal stenosis. Moderate to severe LEFT greater than RIGHT neural foraminal narrowing. L5-S1: Posterior decompression. Small broad-based disc osteophyte complex. No canal stenosis. Mild neural foraminal narrowing. IMPRESSION: 1. Thoracal, lumbar and sacral posterior hardware results in artifact. 2. Acute moderate to severe L1 burst fracture. Known additional acute fractures better demonstrated on today's CT. 3. No canal stenosis. Status post RIGHT L4-5 hemilaminectomy and L5-S1 bilateral laminectomies. 4. Neural foraminal narrowing L3-4 through L5-S1: Moderate to severe at L4-5. Electronically Signed   By: Elon Alas M.D.   On: 12/05/2017 20:57   Ct Abdomen Pelvis W Contrast  Result Date: 12/06/2017 CLINICAL DATA:  77 year old male with known abdominal aortic aneurysm presenting with back pain. EXAM: CT ABDOMEN AND PELVIS WITH CONTRAST TECHNIQUE: Multidetector CT imaging of the abdomen and pelvis was performed using the standard protocol following bolus administration of intravenous contrast. CONTRAST:  127mL OMNIPAQUE IOHEXOL 300 MG/ML  SOLN COMPARISON:  CT of the lumbar spine dated 10/30/2017 FINDINGS: Lower chest: Partially visualized bilateral pleural effusions, right greater left with associated partial compressive atelectasis of the lower lobes. Pneumonia is not excluded. Clinical correlation is recommended. No  intra-abdominal free air.  Small free fluid within the pelvis. Hepatobiliary: Cholecystectomy. Mild intrahepatic biliary ductal dilatation. The liver is unremarkable. Pancreas: Unremarkable. No pancreatic ductal dilatation or surrounding inflammatory changes. Spleen: Normal in size without focal abnormality. Adrenals/Urinary Tract: The adrenal glands are unremarkable. Punctate nonobstructing right renal upper pole calculus. Mild bilateral renal parenchyma atrophy. There is no hydronephrosis on either side. There is symmetric enhancement and excretion of contrast by both kidneys. There is a 15 mm right renal upper pole cyst. The visualized ureters appear unremarkable. High attenuating content within the urinary bladder on corticomedullary phase of renal enhancement noted. Although this may represent excreted contrast, blood is not excluded. Correlation with hematuria recommended. Stomach/Bowel: There is sigmoid diverticulosis without active inflammatory changes. Moderate amount of stool noted throughout the colon. There is no bowel obstruction or active inflammation. Appendectomy. Vascular/Lymphatic: There is moderate aortoiliac atherosclerotic disease. There is a 2 cm infrarenal aortic ectasia. The IVC is unremarkable. No portal venous gas. There is no adenopathy. Reproductive: The prostate and seminal vesicles are grossly unremarkable as visualized. Other: Small amount of fluid in the right inguinal canal. Musculoskeletal: Osteopenia with L1 compression fracture approximately 50% loss of vertebral body height similar or slightly progressed since the prior CT. Approximately 5 mm retropulsion of the superior posterior cortex similar to prior CT. No new fracture. Lower thoracic as well as L4 vertebroplasty. Posterior thoracolumbar fixation hardware as seen on the prior CT. No acute osseous pathology. IMPRESSION: 1. Partially visualized bilateral pleural effusions with associated partial compressive atelectasis of the  lower lobes. Pneumonia is not excluded. Clinical correlation is recommended. 2. High attenuating content within the urinary bladder in the early contrast enhancement of the kidneys (corticomedullary phase). This is likely residual contrast from recent MRI. Clinical correlation is recommended to exclude hematuria. 3. A 2 cm infrarenal abdominal aortic ectasia. 4.  Aortic Atherosclerosis (ICD10-I70.0). 5. Small amount of free fluid within the pelvis of indeterminate etiology. 6. Constipation and colonic diverticulosis. No bowel obstruction or active inflammation. 7. Osteopenia with compression fracture  of the L1 similar to prior CT. Multilevel vertebroplasties as well as thoracolumbar fusion similar to prior CT. Electronically Signed   By: Anner Crete M.D.   On: 12/06/2017 02:26    Antibiotics:  Anti-infectives (From admission, onward)   None      Discharge Exam: Blood pressure (!) 163/77, pulse 71, temperature 97.8 F (36.6 C), temperature source Oral, resp. rate 20, height 5\' 11"  (1.803 m), weight 83.5 kg (184 lb), SpO2 96 %. Neurologic: Grossly normal Ambulating and voiding well, pain controlled with oral medications  Discharge Medications:   Allergies as of 12/07/2017      Reactions   Penicillins Hives, Swelling, Other (See Comments)   SWELLING REACTION UNSPECIFIED PATIENT HAS TAKEN AMOXICILLIN ON MED HX FROM [facility] PATIENT HAS HAD A PCN REACTION WITH IMMEDIATE RASH, FACIAL/TONGUE/THROAT SWELLING, SOB, OR LIGHTHEADEDNESS WITH HYPOTENSION:  #  #  #  YES  #  #  #   Has patient had a PCN reaction causing severe rash involving mucus membranes or skin necrosis: No Has patient had a PCN reaction that required hospitalization No Has patient had a PCN reaction occurring within the last 10 years: No   Demerol [meperidine] Hives, Nausea And Vomiting      Medication List    TAKE these medications   acetaminophen 325 MG tablet Commonly known as:  TYLENOL Take 650 mg by mouth 4 (four) times  daily.   amLODipine 5 MG tablet Commonly known as:  NORVASC TAKE ONE TABLET BY MOUTH EVERY DAY   amoxicillin 500 MG capsule Commonly known as:  AMOXIL Take 2,000 mg by mouth once as needed. 1 hour prior to dental appointment   aspirin 325 MG EC tablet Take 325 mg by mouth every evening.   atorvastatin 40 MG tablet Commonly known as:  LIPITOR Take 1 tablet (40 mg total) by mouth daily. What changed:  when to take this   cetirizine 10 MG tablet Commonly known as:  ZYRTEC Take 10 mg by mouth daily.   cyclobenzaprine 10 MG tablet Commonly known as:  FLEXERIL Take 10 mg by mouth 3 (three) times daily as needed for muscle spasms. What changed:  Another medication with the same name was added. Make sure you understand how and when to take each.   cyclobenzaprine 5 MG tablet Commonly known as:  FLEXERIL Take 1 tablet (5 mg total) by mouth 3 (three) times daily as needed for muscle spasms. What changed:  Another medication with the same name was added. Make sure you understand how and when to take each.   cyclobenzaprine 10 MG tablet Commonly known as:  FLEXERIL Take 1 tablet (10 mg total) by mouth 2 (two) times daily as needed for muscle spasms. What changed:  You were already taking a medication with the same name, and this prescription was added. Make sure you understand how and when to take each.   donepezil 5 MG tablet Commonly known as:  ARICEPT Take 5 mg by mouth at bedtime.   fenofibrate 160 MG tablet TAKE ONE TABLET BY MOUTH EVERY DAY What changed:    how much to take  how to take this  when to take this   ferrous sulfate 325 (65 FE) MG tablet Take 1 tablet (325 mg total) by mouth 2 (two) times daily with a meal.   FLUoxetine 20 MG capsule Commonly known as:  PROZAC TAKE 1 CAPSULE BY MOUTH EVERY DAY What changed:    how much to take  how to take  this  when to take this   folic acid 1 MG tablet Commonly known as:  FOLVITE Take 1 mg by mouth 2 (two)  times daily.   gabapentin 400 MG capsule Commonly known as:  NEURONTIN Take 400 mg by mouth 3 (three) times daily.   GAVILAX powder Generic drug:  polyethylene glycol powder USE 1 CAPFUL IN 4-8 OUNCES OF LIQUID DAILY AS DIRECTED What changed:  See the new instructions.   HYDROcodone-acetaminophen 10-325 MG tablet Commonly known as:  NORCO Take 1 tablet by mouth every 6 (six) hours as needed for moderate pain. What changed:  when to take this   hydrocortisone cream 1 % Apply 1 application topically daily as needed for itching.   HYDROmorphone 4 MG tablet Commonly known as:  DILAUDID Take 1 tablet (4 mg total) by mouth every 6 (six) hours as needed for severe pain.   lidocaine 5 % Commonly known as:  LIDODERM Place 1 patch onto the skin daily as needed (pain). Remove & Discard patch within 12 hours or as directed by MD   lisinopril 20 MG tablet Commonly known as:  PRINIVIL,ZESTRIL TAKE ONE TABLET BY MOUTH EVERY EVENING What changed:    how much to take  how to take this  when to take this   mirabegron ER 50 MG Tb24 tablet Commonly known as:  MYRBETRIQ Take 50 mg by mouth daily.   omeprazole 40 MG capsule Commonly known as:  PRILOSEC TAKE 1 CAPSULE BY MOUTH DAILY What changed:    how much to take  how to take this  when to take this   senna-docusate 8.6-50 MG tablet Commonly known as:  Senokot-S Take 1 tablet by mouth 2 (two) times daily as needed for mild constipation or moderate constipation.   tamsulosin 0.4 MG Caps capsule Commonly known as:  FLOMAX TAKE 1 CAPSULE BY MOUTH DAILY What changed:    how much to take  how to take this  when to take this   testosterone cypionate 200 MG/ML injection Commonly known as:  DEPOTESTOSTERONE CYPIONATE INJECT 65mL INTO THE MUSCLE EVERY 14 DAYS   traZODone 50 MG tablet Commonly known as:  DESYREL Take 100 mg by mouth at bedtime.       Disposition: home   Final Dx: back  pain       Signed: Ocie Cornfield Ashlay Altieri 12/07/2017, 10:59 AM

## 2017-12-07 NOTE — Progress Notes (Addendum)
Doing well. Pain well controlled on oral medication. Needs to work with PT today. If he does ok with PT he may go home this afternoon. Please call for discharge order after PT session if ready.   [phone]

## 2017-12-07 NOTE — Progress Notes (Signed)
Pt being discharged from hospital per orders from MD. Pt educated on discharge instructions. Pt verbalized understanding of instructions. All questions and concerns were addressed. Pt's IV was removed prior to discharge. Pt exited hospital via wheelchair accompanied by staff. 

## 2017-12-08 DIAGNOSIS — E119 Type 2 diabetes mellitus without complications: Secondary | ICD-10-CM | POA: Diagnosis not present

## 2017-12-08 DIAGNOSIS — F329 Major depressive disorder, single episode, unspecified: Secondary | ICD-10-CM | POA: Diagnosis not present

## 2017-12-08 DIAGNOSIS — M4326 Fusion of spine, lumbar region: Secondary | ICD-10-CM | POA: Diagnosis not present

## 2017-12-08 DIAGNOSIS — T84216D Breakdown (mechanical) of internal fixation device of vertebrae, subsequent encounter: Secondary | ICD-10-CM | POA: Diagnosis not present

## 2017-12-08 DIAGNOSIS — I2510 Atherosclerotic heart disease of native coronary artery without angina pectoris: Secondary | ICD-10-CM | POA: Diagnosis not present

## 2017-12-08 DIAGNOSIS — I10 Essential (primary) hypertension: Secondary | ICD-10-CM | POA: Diagnosis not present

## 2017-12-09 ENCOUNTER — Other Ambulatory Visit: Payer: Self-pay | Admitting: Urgent Care

## 2017-12-09 ENCOUNTER — Telehealth: Payer: Self-pay | Admitting: *Deleted

## 2017-12-09 DIAGNOSIS — D649 Anemia, unspecified: Secondary | ICD-10-CM

## 2017-12-09 NOTE — Telephone Encounter (Signed)
Called patient to ask if he can come in on Monday @ 9 am for 1 unit pRBC's. Hgb 7.5.  Patient has agreed to do so.

## 2017-12-12 ENCOUNTER — Other Ambulatory Visit: Payer: Self-pay

## 2017-12-12 ENCOUNTER — Inpatient Hospital Stay: Payer: Medicare Other

## 2017-12-12 ENCOUNTER — Other Ambulatory Visit: Payer: Self-pay | Admitting: Urgent Care

## 2017-12-12 DIAGNOSIS — I2510 Atherosclerotic heart disease of native coronary artery without angina pectoris: Secondary | ICD-10-CM | POA: Diagnosis not present

## 2017-12-12 DIAGNOSIS — F329 Major depressive disorder, single episode, unspecified: Secondary | ICD-10-CM | POA: Diagnosis not present

## 2017-12-12 DIAGNOSIS — R700 Elevated erythrocyte sedimentation rate: Secondary | ICD-10-CM | POA: Diagnosis not present

## 2017-12-12 DIAGNOSIS — E119 Type 2 diabetes mellitus without complications: Secondary | ICD-10-CM | POA: Diagnosis not present

## 2017-12-12 DIAGNOSIS — D61818 Other pancytopenia: Secondary | ICD-10-CM | POA: Diagnosis not present

## 2017-12-12 DIAGNOSIS — T84216D Breakdown (mechanical) of internal fixation device of vertebrae, subsequent encounter: Secondary | ICD-10-CM | POA: Diagnosis not present

## 2017-12-12 DIAGNOSIS — D509 Iron deficiency anemia, unspecified: Secondary | ICD-10-CM | POA: Diagnosis not present

## 2017-12-12 DIAGNOSIS — K5900 Constipation, unspecified: Secondary | ICD-10-CM | POA: Diagnosis not present

## 2017-12-12 DIAGNOSIS — I10 Essential (primary) hypertension: Secondary | ICD-10-CM | POA: Diagnosis not present

## 2017-12-12 DIAGNOSIS — E291 Testicular hypofunction: Secondary | ICD-10-CM | POA: Diagnosis not present

## 2017-12-12 DIAGNOSIS — M4326 Fusion of spine, lumbar region: Secondary | ICD-10-CM | POA: Diagnosis not present

## 2017-12-12 DIAGNOSIS — D649 Anemia, unspecified: Secondary | ICD-10-CM

## 2017-12-12 DIAGNOSIS — M069 Rheumatoid arthritis, unspecified: Secondary | ICD-10-CM | POA: Diagnosis not present

## 2017-12-12 LAB — SAMPLE TO BLOOD BANK

## 2017-12-12 LAB — CBC WITH DIFFERENTIAL/PLATELET
Basophils Absolute: 0 10*3/uL (ref 0–0.1)
Basophils Relative: 1 %
Eosinophils Absolute: 0.2 10*3/uL (ref 0–0.7)
Eosinophils Relative: 5 %
HCT: 25.8 % — ABNORMAL LOW (ref 40.0–52.0)
Hemoglobin: 8.4 g/dL — ABNORMAL LOW (ref 13.0–18.0)
Lymphocytes Relative: 16 %
Lymphs Abs: 0.5 10*3/uL — ABNORMAL LOW (ref 1.0–3.6)
MCH: 28.4 pg (ref 26.0–34.0)
MCHC: 32.7 g/dL (ref 32.0–36.0)
MCV: 86.9 fL (ref 80.0–100.0)
Monocytes Absolute: 0.3 10*3/uL (ref 0.2–1.0)
Monocytes Relative: 10 %
Neutro Abs: 2.2 10*3/uL (ref 1.4–6.5)
Neutrophils Relative %: 68 %
Platelets: 212 10*3/uL (ref 150–440)
RBC: 2.97 MIL/uL — ABNORMAL LOW (ref 4.40–5.90)
RDW: 17.3 % — ABNORMAL HIGH (ref 11.5–14.5)
WBC: 3.3 10*3/uL — ABNORMAL LOW (ref 3.8–10.6)

## 2017-12-12 LAB — BASIC METABOLIC PANEL
Anion gap: 8 (ref 5–15)
BUN: 33 mg/dL — ABNORMAL HIGH (ref 8–23)
CO2: 24 mmol/L (ref 22–32)
Calcium: 9.1 mg/dL (ref 8.9–10.3)
Chloride: 105 mmol/L (ref 98–111)
Creatinine, Ser: 1.38 mg/dL — ABNORMAL HIGH (ref 0.61–1.24)
GFR calc Af Amer: 55 mL/min — ABNORMAL LOW (ref >60)
GFR calc non Af Amer: 47 mL/min — ABNORMAL LOW (ref >60)
Glucose, Bld: 117 mg/dL — ABNORMAL HIGH (ref 70–99)
Potassium: 4.7 mmol/L (ref 3.5–5.1)
Sodium: 137 mmol/L (ref 135–145)

## 2017-12-12 LAB — FERRITIN: Ferritin: 124 ng/mL (ref 24–336)

## 2017-12-12 NOTE — Progress Notes (Signed)
Re: PRBC transfusion  Patient presented to the clinic on 12/09/2017 for MD appointment. Patient was not scheduled. He was upset. Patient notes that his blood was low. Labs reviewed. Hemoglobin was 7.5 at the time of discharge. Patient was contacted and asked to come in on 12/12/2017 for labs and +/- blood transfusion.   Labs today reviewed by NP: Lab Results  Component Value Date   WBC 3.3 (L) 12/12/2017   HGB 8.4 (L) 12/12/2017   HCT 25.8 (L) 12/12/2017   MCV 86.9 12/12/2017   PLT 212 12/12/2017   Patient presented with information and decided that he did not wish to proceed with PRBC transfusion at this time. His blood has started to improve since discharge from the hospital. Patient is scheduled to RTC for MD assessment on 12/15/2017. I had lab obtain his ferritin and iron studies today so that we could be ready to provide additional iron infusion if needed when he comes in. Results pending at this time.   Honor Loh, MSN, APRN, FNP-C, CEN Oncology/Hematology Nurse Practitioner  [facility] 12/12/17, 10:23 AM

## 2017-12-12 NOTE — Progress Notes (Signed)
Hbg improved to 8.4 today. No blood needed. Patient to return to clinic for Lab/MD on Thursday

## 2017-12-14 DIAGNOSIS — I2510 Atherosclerotic heart disease of native coronary artery without angina pectoris: Secondary | ICD-10-CM | POA: Diagnosis not present

## 2017-12-14 DIAGNOSIS — E119 Type 2 diabetes mellitus without complications: Secondary | ICD-10-CM | POA: Diagnosis not present

## 2017-12-14 DIAGNOSIS — T84216D Breakdown (mechanical) of internal fixation device of vertebrae, subsequent encounter: Secondary | ICD-10-CM | POA: Diagnosis not present

## 2017-12-14 DIAGNOSIS — M4326 Fusion of spine, lumbar region: Secondary | ICD-10-CM | POA: Diagnosis not present

## 2017-12-14 DIAGNOSIS — I10 Essential (primary) hypertension: Secondary | ICD-10-CM | POA: Diagnosis not present

## 2017-12-14 DIAGNOSIS — F329 Major depressive disorder, single episode, unspecified: Secondary | ICD-10-CM | POA: Diagnosis not present

## 2017-12-15 ENCOUNTER — Inpatient Hospital Stay: Payer: Medicare Other

## 2017-12-15 ENCOUNTER — Other Ambulatory Visit: Payer: Self-pay

## 2017-12-15 ENCOUNTER — Other Ambulatory Visit: Payer: Self-pay | Admitting: *Deleted

## 2017-12-15 DIAGNOSIS — E291 Testicular hypofunction: Secondary | ICD-10-CM

## 2017-12-15 DIAGNOSIS — D696 Thrombocytopenia, unspecified: Secondary | ICD-10-CM

## 2017-12-15 DIAGNOSIS — R700 Elevated erythrocyte sedimentation rate: Secondary | ICD-10-CM | POA: Diagnosis not present

## 2017-12-15 DIAGNOSIS — D61818 Other pancytopenia: Secondary | ICD-10-CM | POA: Diagnosis not present

## 2017-12-15 DIAGNOSIS — K5900 Constipation, unspecified: Secondary | ICD-10-CM | POA: Diagnosis not present

## 2017-12-15 DIAGNOSIS — M069 Rheumatoid arthritis, unspecified: Secondary | ICD-10-CM | POA: Diagnosis not present

## 2017-12-15 DIAGNOSIS — D509 Iron deficiency anemia, unspecified: Secondary | ICD-10-CM | POA: Diagnosis not present

## 2017-12-15 LAB — CBC WITH DIFFERENTIAL/PLATELET
Basophils Absolute: 0 10*3/uL (ref 0–0.1)
Basophils Relative: 1 %
Eosinophils Absolute: 0.1 10*3/uL (ref 0–0.7)
Eosinophils Relative: 4 %
HCT: 26.4 % — ABNORMAL LOW (ref 40.0–52.0)
Hemoglobin: 8.7 g/dL — ABNORMAL LOW (ref 13.0–18.0)
Lymphocytes Relative: 25 %
Lymphs Abs: 0.7 10*3/uL — ABNORMAL LOW (ref 1.0–3.6)
MCH: 28.2 pg (ref 26.0–34.0)
MCHC: 32.8 g/dL (ref 32.0–36.0)
MCV: 86 fL (ref 80.0–100.0)
Monocytes Absolute: 0.3 10*3/uL (ref 0.2–1.0)
Monocytes Relative: 11 %
Neutro Abs: 1.7 10*3/uL (ref 1.4–6.5)
Neutrophils Relative %: 59 %
Platelets: 204 10*3/uL (ref 150–440)
RBC: 3.07 MIL/uL — ABNORMAL LOW (ref 4.40–5.90)
RDW: 17 % — ABNORMAL HIGH (ref 11.5–14.5)
WBC: 2.9 10*3/uL — ABNORMAL LOW (ref 3.8–10.6)

## 2017-12-15 LAB — BASIC METABOLIC PANEL
Anion gap: 9 (ref 5–15)
BUN: 24 mg/dL — ABNORMAL HIGH (ref 8–23)
CO2: 22 mmol/L (ref 22–32)
Calcium: 9.2 mg/dL (ref 8.9–10.3)
Chloride: 105 mmol/L (ref 98–111)
Creatinine, Ser: 1.19 mg/dL (ref 0.61–1.24)
GFR calc Af Amer: >60 mL/min (ref >60)
GFR calc non Af Amer: 56 mL/min — ABNORMAL LOW (ref >60)
Glucose, Bld: 124 mg/dL — ABNORMAL HIGH (ref 70–99)
Potassium: 4.3 mmol/L (ref 3.5–5.1)
Sodium: 136 mmol/L (ref 135–145)

## 2017-12-15 LAB — FERRITIN: Ferritin: 106 ng/mL (ref 24–336)

## 2017-12-16 ENCOUNTER — Other Ambulatory Visit: Payer: Medicare Other

## 2017-12-16 ENCOUNTER — Inpatient Hospital Stay (HOSPITAL_BASED_OUTPATIENT_CLINIC_OR_DEPARTMENT_OTHER): Payer: Medicare Other | Admitting: Hematology and Oncology

## 2017-12-16 ENCOUNTER — Inpatient Hospital Stay: Payer: Medicare Other

## 2017-12-16 ENCOUNTER — Other Ambulatory Visit: Payer: Self-pay

## 2017-12-16 ENCOUNTER — Ambulatory Visit: Payer: Medicare Other | Admitting: Urgent Care

## 2017-12-16 VITALS — BP 153/82 | HR 70 | Temp 96.4°F | Resp 18 | Wt 173.9 lb

## 2017-12-16 DIAGNOSIS — K5900 Constipation, unspecified: Secondary | ICD-10-CM | POA: Diagnosis not present

## 2017-12-16 DIAGNOSIS — D509 Iron deficiency anemia, unspecified: Secondary | ICD-10-CM

## 2017-12-16 DIAGNOSIS — R700 Elevated erythrocyte sedimentation rate: Secondary | ICD-10-CM | POA: Diagnosis not present

## 2017-12-16 DIAGNOSIS — E291 Testicular hypofunction: Secondary | ICD-10-CM | POA: Diagnosis not present

## 2017-12-16 DIAGNOSIS — M069 Rheumatoid arthritis, unspecified: Secondary | ICD-10-CM | POA: Diagnosis not present

## 2017-12-16 DIAGNOSIS — D72819 Decreased white blood cell count, unspecified: Secondary | ICD-10-CM

## 2017-12-16 DIAGNOSIS — I6523 Occlusion and stenosis of bilateral carotid arteries: Secondary | ICD-10-CM | POA: Diagnosis not present

## 2017-12-16 DIAGNOSIS — D61818 Other pancytopenia: Secondary | ICD-10-CM | POA: Diagnosis not present

## 2017-12-16 DIAGNOSIS — D649 Anemia, unspecified: Secondary | ICD-10-CM

## 2017-12-16 NOTE — Progress Notes (Signed)
Here for follow up. Per pt energy level low. In constant pain after 5 th back surgery in May.

## 2017-12-16 NOTE — Progress Notes (Signed)
[facility] day:  12/16/2017  Chief Complaint: ZAEEM KANDEL is a 77 y.o. male with rheumatoid arthritis, iron deficiency anemia, and mild pancytopenia who is seen for 1 month assessment.  HPI:  The patient was last seen in the hematology clinic on  11/17/2017.  At that time, he felt "ok".  Diet was modest.  Review of work-up suggested the etiiology of his anemia was secondary to iron deficiency and low testosterone.  Iron saturation low.  Ferritin was falsely elevated secondary to elevated sed rate.  We discussed oral versus IV iron.  Given his poor tolerance of oral iron (constipation), we discussed a trial of IV iron.  He received weekly Venofer x 2 (11/25/2017 - 12/02/2017).  He was admitted to [facility] form 12/05/2017 - 12/07/2017 with back pain.  No pathology was noted on MRI or CT.  CBC on 12/15/2017 revealed a hematocrit of 26.4, hemoglobin 8.7, MCV 86, platelets 204,000, WBC 2900 with an ANC of 1700. Ferritin was 106.  Creatinine was 1.19.  During the interim, he has tried to eat more iron rich food.  He has been eating salads and spinach.  He has received testosterone every 2 weeks (11/03/2017).   Past Medical History:  Diagnosis Date  . Abnormal CT scan    Asymmetric left rectal wall thickening   . Anemia   . Anxiety   . Arrhythmia   . Chronic lower back pain   . Complication of anesthesia    Memory loss 09/2015  . Coronary artery disease   . Depression   . GERD (gastroesophageal reflux disease)   . Glaucoma   . H/O thoracic aortic aneurysm repair   . Heart murmur   . History of being hospitalized    memory lose kidney funtion down blood pressure up  . History of blood transfusion    "think he had one when he had heart valve OR" (10/14/2016); "none since" (10/19/2017)  . History of chicken pox   . Hyperlipidemia   . Hypertension   . Lumbar stenosis   . Neuropathy   . Osteoarthritis    worse in feet and ankles  .  Paroxysmal A-fib ([place])   . Poor short term memory    takes Aricept  . Rheumatoid arthritis ([place])    "all over" (10/14/2016)  . Schizophrenia ([place])   . Seasonal allergies   . Valvular heart disease     Past Surgical History:  Procedure Laterality Date  . AORTIC VALVE REPLACEMENT  2007   [facility].  [place], [place]; "pig valve"  . APPENDECTOMY  2010  . BACK SURGERY    . CARDIAC VALVE REPLACEMENT    . CATARACT EXTRACTION W/ INTRAOCULAR LENS  IMPLANT, BILATERAL Bilateral 2012  . COLONOSCOPY WITH PROPOFOL N/A 09/24/2016   Procedure: COLONOSCOPY WITH PROPOFOL;  Surgeon: Jonathon Bellows, MD;  Location: [facility] ENDOSCOPY;  Service: Endoscopy;  Laterality: N/A;  . ESOPHAGOGASTRODUODENOSCOPY (EGD) WITH PROPOFOL N/A 09/24/2016   Procedure: ESOPHAGOGASTRODUODENOSCOPY (EGD) WITH PROPOFOL;  Surgeon: Jonathon Bellows, MD;  Location: [facility] ENDOSCOPY;  Service: Endoscopy;  Laterality: N/A;  . GIVENS CAPSULE STUDY N/A 09/24/2016   Procedure: GIVENS CAPSULE STUDY;  Surgeon: Jonathon Bellows, MD;  Location: [facility] ENDOSCOPY;  Service: Endoscopy;  Laterality: N/A;  . HAMMER TOE SURGERY Right 10/09/2015   Procedure: HAMMER TOE REPAIR WITH K-WIRE FIXATION RIGHT SECOND TOE;  Surgeon: Albertine Patricia, DPM;  Location: [place];  Service: Podiatry;  Laterality: Right;  WITH LOCAL  .  JOINT REPLACEMENT    . LAMINECTOMY WITH POSTERIOR LATERAL ARTHRODESIS LEVEL 3 N/A 09/28/2017   Procedure: LUMBAR  POSTEROR  FUSION REVISION - LUMBAR ONE -TWO, LUMBAR TWO -THREE,THREE-FOUR ,BILATERAL ARTHRODESIS REMOVAL LUMBAR THREE HARDWARE;  Surgeon: Kary Kos, MD;  Location: [place];  Service: Neurosurgery;  Laterality: N/A;  . LAMINECTOMY WITH POSTERIOR LATERAL ARTHRODESIS LEVEL 3 N/A 10/20/2017   Procedure: Revision fusion and removal of hardware Lumbar one, Pedicle screw fixation thoracic ten-lumbar two, thoracic nine-ten laminotomy, Posterior Lumbar Arthrodesis thoracic ten-lumbar two ;  Surgeon: Kary Kos, MD;  Location: No Name;  Service:  Neurosurgery;  Laterality: N/A;  . LAPAROSCOPIC CHOLECYSTECTOMY  2010  . LUMBAR FUSION Right 06/08/2017   LUMBAR THREE-FOUR, LUMBAR FOUR-FIVE POSTEROLATERAL ARTHRODESIS WITH RIGHT LUMBAR FOUR-FIVE LAMINECTOMY/FORAMINOTOMY  . LUMBAR LAMINECTOMY/DECOMPRESSION MICRODISCECTOMY Right 03/08/2016   Procedure: Laminectomy and Foraminotomy - Lumbar Five-Sacral One Right;  Surgeon: Kary Kos, MD;  Location: [place];  Service: Neurosurgery;  Laterality: Right;  Right  . MULTIPLE TOOTH EXTRACTIONS     "3"  . POSTERIOR LUMBAR FUSION  10/14/2016   L5-S1  . REPLACEMENT TOTAL KNEE Left 2003  . THORACIC AORTIC ANEURYSM REPAIR  2010    Family History  Problem Relation Age of Onset  . Heart failure Mother   . Hypertension Mother   . Asthma Mother   . Osteoporosis Mother   . Heart attack Father 59       MI  . Hypertension Sister   . Prostate cancer Neg Hx   . Bladder Cancer Neg Hx   . Kidney cancer Neg Hx     Social History:  reports that he quit smoking about 36 years ago. His smoking use included cigarettes. He has a 32.00 pack-year smoking history. He has never used smokeless tobacco. He reports that he drinks alcohol. He reports that he does not use drugs.  He smoked 1 pack a day x 12 years.  He stopped smoking 30 years ago.  He drinks a glass of wine occasionally.  He is a Software engineer.  [place]  The patient is alone today.  Allergies:  Allergies  Allergen Reactions  . Penicillins Hives, Swelling and Other (See Comments)    SWELLING REACTION UNSPECIFIED PATIENT HAS TAKEN AMOXICILLIN ON MED HX FROM [facility] PATIENT HAS HAD A PCN REACTION WITH IMMEDIATE RASH, FACIAL/TONGUE/THROAT SWELLING, SOB, OR LIGHTHEADEDNESS WITH HYPOTENSION:  #  #  #  YES  #  #  #   Has patient had a PCN reaction causing severe rash involving mucus membranes or skin necrosis: No Has patient had a PCN reaction that required hospitalization No Has patient had a PCN reaction  occurring within the last 10 years: No  . Demerol [Meperidine] Hives and Nausea And Vomiting    Current Medications: Current Outpatient Medications  Medication Sig Dispense Refill  . amLODipine (NORVASC) 5 MG tablet TAKE ONE TABLET BY MOUTH EVERY DAY 90 tablet 1  . aspirin 325 MG EC tablet Take 325 mg by mouth every evening.     Marland Kitchen atorvastatin (LIPITOR) 40 MG tablet Take 1 tablet (40 mg total) by mouth daily. (Patient taking differently: Take 40 mg by mouth daily at 6 PM. ) 90 tablet 3  . cetirizine (ZYRTEC) 10 MG tablet Take 10 mg by mouth daily.    . cyclobenzaprine (FLEXERIL) 10 MG tablet Take 1 tablet (10 mg total) by mouth 2 (two) times daily as needed for muscle spasms. 30 tablet  0  . donepezil (ARICEPT) 5 MG tablet Take 5 mg by mouth at bedtime.     . fenofibrate 160 MG tablet TAKE ONE TABLET BY MOUTH EVERY DAY (Patient taking differently: TAKE ONE TABLET BY MOUTH EVERY EVENING) 30 tablet 3  . FLUoxetine (PROZAC) 20 MG capsule TAKE 1 CAPSULE BY MOUTH EVERY DAY (Patient taking differently: TAKE 1 CAPSULE (20MG) BY MOUTH EVERY DAY) 90 capsule 1  . folic acid (FOLVITE) 1 MG tablet Take 1 mg by mouth 2 (two) times daily.    Marland Kitchen gabapentin (NEURONTIN) 400 MG capsule Take 400 mg by mouth 3 (three) times daily.     Marland Kitchen GAVILAX powder USE 1 CAPFUL IN 4-8 OUNCES OF LIQUID DAILY AS DIRECTED (Patient taking differently: USE 1 CAPFUL IN 4-8 OUNCES OF LIQUID DAILY AS NEEDED FOR CONSTIPATION) 510 g 0  . HYDROcodone-acetaminophen (NORCO) 10-325 MG tablet Take 1 tablet by mouth every 6 (six) hours as needed for moderate pain. (Patient taking differently: Take 1 tablet by mouth every 4 (four) hours as needed for moderate pain. ) 20 tablet 0  . lisinopril (PRINIVIL,ZESTRIL) 20 MG tablet TAKE ONE TABLET BY MOUTH EVERY EVENING (Patient taking differently: TAKE ONE TABLET (20MG) BY MOUTH EVERY EVENING) 90 tablet 3  . mirabegron ER (MYRBETRIQ) 50 MG TB24 tablet Take 50 mg by mouth daily.    Marland Kitchen omeprazole (PRILOSEC)  40 MG capsule TAKE 1 CAPSULE BY MOUTH DAILY (Patient taking differently: TAKE 1 CAPSULE (40MG) BY MOUTH EVERY EVENING) 30 capsule 3  . tamsulosin (FLOMAX) 0.4 MG CAPS capsule TAKE 1 CAPSULE BY MOUTH DAILY (Patient taking differently: TAKE 1 CAPSULE (0.4MG) BY MOUTH EVERY EVENING) 90 capsule 0  . testosterone cypionate (DEPOTESTOSTERONE CYPIONATE) 200 MG/ML injection INJECT 39m INTO THE MUSCLE EVERY 14 DAYS 10 mL 0  . traZODone (DESYREL) 50 MG tablet Take 100 mg by mouth at bedtime.    .Marland Kitchenacetaminophen (TYLENOL) 325 MG tablet Take 650 mg by mouth 4 (four) times daily.    .Marland Kitchenamoxicillin (AMOXIL) 500 MG capsule Take 2,000 mg by mouth once as needed. 1 hour prior to dental appointment    . brimonidine (ALPHAGAN) 0.2 % ophthalmic solution     . hydrocortisone cream 1 % Apply 1 application topically daily as needed for itching.    . lidocaine (LIDODERM) 5 % Place 1 patch onto the skin daily as needed (pain). Remove & Discard patch within 12 hours or as directed by MD      No current facility-administered medications for this visit.     Review of Systems:  GENERAL:  Feels tired.  No fevers, sweats.  Weight down 9 pounds. PERFORMANCE STATUS (ECOG):  2 HEENT:  No visual changes, runny nose, sore throat, mouth sores or tenderness. Lungs: No shortness of breath or cough.  No hemoptysis. Cardiac:  No chest pain, palpitations, orthopnea, or PND. GI:  No nausea, vomiting, diarrhea, constipation, melena or hematochezia. GU:  No urgency, frequency, dysuria, or hematuria. Musculoskeletal:  s/p back surgery 10/20/2017.  Rheumatoid arthritis.  Feet affected by arthritis.  No muscle tenderness. Extremities:  No pain or swelling. Skin:  No rashes or skin changes. Neuro:  No headache, numbness or weakness, balance or coordination issues. Endocrine:  No diabetes, thyroid issues, hot flashes or night sweats. Psych:  No mood changes, depression or anxiety. Pain:  Back pain. Review of systems:  All other systems  reviewed and found to be negative.   Physical Exam: Blood pressure (!) 153/82, pulse 70, temperature (!) 96.4  F (35.8 C), temperature source Tympanic, resp. rate 18, weight 173 lb 14.4 oz (78.9 kg). GENERAL:  Well developed, well nourished, gentleman sitting comfortably in the exam room in no acute distress. MENTAL STATUS:  Alert and oriented to person, place and time. HEAD:  White hair and goatee.  Normocephalic, atraumatic, face symmetric, no Cushingoid features. EYES:  Hazel eyes.  Pupils equal round and reactive to light and accomodation.  No conjunctivitis or scleral icterus. ENT:  Oropharynx clear without lesion.  Tongue normal. Mucous membranes moist.  RESPIRATORY:  Clear to auscultation without rales, wheezes or rhonchi. CARDIOVASCULAR:  Regular rate and rhythm without murmur, rub or gallop. ABDOMEN:  Soft, non-tender, with active bowel sounds, and no hepatosplenomegaly.  No masses. BACK:  Wearing a back brace. SKIN:  No rashes, ulcers or lesions. EXTREMITIES: No edema, no skin discoloration or tenderness.  No palpable cords. LYMPH NODES: No palpable cervical, supraclavicular, axillary or inguinal adenopathy  NEUROLOGICAL: Unremarkable. PSYCH:  Appropriate.    Orders Only on 12/15/2017  Component Date Value Ref Range Status  . Sodium 12/15/2017 136  135 - 145 mmol/L Final  . Potassium 12/15/2017 4.3  3.5 - 5.1 mmol/L Final  . Chloride 12/15/2017 105  98 - 111 mmol/L Final  . CO2 12/15/2017 22  22 - 32 mmol/L Final  . Glucose, Bld 12/15/2017 124* 70 - 99 mg/dL Final  . BUN 12/15/2017 24* 8 - 23 mg/dL Final  . Creatinine, Ser 12/15/2017 1.19  0.61 - 1.24 mg/dL Final  . Calcium 12/15/2017 9.2  8.9 - 10.3 mg/dL Final  . GFR calc non Af Amer 12/15/2017 56* >60 mL/min Final  . GFR calc Af Amer 12/15/2017 >60  >60 mL/min Final   Comment: (NOTE) The eGFR has been calculated using the CKD EPI equation. This calculation has not been validated in all clinical situations. eGFR's  persistently <60 mL/min signify possible Chronic Kidney Disease.   Georgiann Hahn gap 12/15/2017 9  5 - 15 Final   Performed at [facility], [address] [place], [place] [address]  . Ferritin 12/15/2017 106  24 - 336 ng/mL Final   Performed at [facility], [address] [place], [place] [address]  . WBC 12/15/2017 2.9* 3.8 - 10.6 K/uL Final  . RBC 12/15/2017 3.07* 4.40 - 5.90 MIL/uL Final  . Hemoglobin 12/15/2017 8.7* 13.0 - 18.0 g/dL Final  . HCT 12/15/2017 26.4* 40.0 - 52.0 % Final  . MCV 12/15/2017 86.0  80.0 - 100.0 fL Final  . MCH 12/15/2017 28.2  26.0 - 34.0 pg Final  . MCHC 12/15/2017 32.8  32.0 - 36.0 g/dL Final  . RDW 12/15/2017 17.0* 11.5 - 14.5 % Final  . Platelets 12/15/2017 204  150 - 440 K/uL Final  . Neutrophils Relative % 12/15/2017 59  % Final  . Neutro Abs 12/15/2017 1.7  1.4 - 6.5 K/uL Final  . Lymphocytes Relative 12/15/2017 25  % Final  . Lymphs Abs 12/15/2017 0.7* 1.0 - 3.6 K/uL Final  . Monocytes Relative 12/15/2017 11  % Final  . Monocytes Absolute 12/15/2017 0.3  0.2 - 1.0 K/uL Final  . Eosinophils Relative 12/15/2017 4  % Final  . Eosinophils Absolute 12/15/2017 0.1  0 - 0.7 K/uL Final  . Basophils Relative 12/15/2017 1  % Final  . Basophils Absolute 12/15/2017 0.0  0 - 0.1 K/uL Final   Performed at [facility], [address] [place], [place] [address]    Assessment:  HAVARD RADIGAN is  a 77 y.o. male with rheumatoid arthritis and progressive anemia over the past 2 years.  He has been on Plaquenil and hydralazine which can cause anemia.  Plaquenil was discontinued on 08/26/2016.  He denies any exposure to radiation or toxins.  He denies any prior history of hepatitis, prior transfusions or HIV risk factors.  He denies any herbal products.  Diet is good.  EGD on 09/24/2016 revealed patchy candidiasis in the entire esophagus.  There were two non-bleeding angioectasias in the duodenum treated with argon plasma coagulation.   Gastritis was biopsied.  Pathology revealed mild chronic gastritis negative for H pylori, dysplasia and malignancy.  Colonoscopy on 09/24/2016 revealed diverticulosis in the entire colon, one 3 mm polyp in the cecum, and non-bleeding internal hemorrhoids.  Pathology from the cecal polyp revealed a tubular adenoma negative for high grade dysplasia and malignancy.    He denies any melena or hematochezia. He denies any hematuria.  CBC on 06/02/2016 revealed a hematocrit of 29.5, hemoglobin 10.0, MCV 92.3, platelets 155,000, and WBC 4200.  Stool was guaiac negative x 2 in 06/2016.  Labs on 06/04/2016 revealed a ferritin of 124, iron saturation 22% and TIBC of 370.  B12 was 883 on 06/18/2014.  Work-up on 07/19/2016 revealed a hematocrit of 31.7, hemoglobin 10.6, MCV 91, platelets 143,000, white count 3000 with an ANC of 1800.  Absolute lymphocyte count was 700 (low). Creatinine was 1.57.  LDH was 269 (98-192).  Normal labs included:  uric acid, folate, [place], SPEP, and copper.  Iron studies included a saturation of 11% (low) and a TIBC of 454 (high) c/w iron deficiency anemia.  Sed rate was 19.  Retic was 0.9% (low).  He is on oral iron with vitamin C.  Chest, abdomen, and pelvic CTon 08/09/2016 revealed no acute findings or clear evidence of malignancy in the chest, abdomen or pelvis. There was asymmetric left rectal wall thickeningpossibly secondary to volume averaging with the prostate gland.  Bone marrow aspirate and biopsy on 11/04/2016 revealed a normocellular marrow (20%) with trilineage hematopoiesis and maturation.  There was mild megakaryocytic atypia.  Storage iron was present.  There were rare ringed sideroblasts.  Correlation with cytogenetics is recommended to exclude a low grade myelodysplastic syndrome with atypia. Cytogenetics and FISH were normal (46, XY).  He was admitted to [facility] on 10/14/2016 - 10/18/2016 for spinal stenosis of the lumbar region.  He underwent redo decompressive  lumbar laminotomy L5-S1 with radical foraminotomy the L5 and S1 nerve root with complete medial facetectomies. He underwent back surgery (revision fusion removal hardware and pedicle screw fixation) on 10/20/2017.  He was admitted to [facility] from 10/28/2017 - 10/31/2017 with symptomatic anemia.  He received 2 units of PRBCs.  Hemoglobin improved from 6.2 to 8.1.  Hospital work-up revealed the following normal labs: ferritin (65), B12 (401), TSH, SPEP, haptoglobin.  Testosterone was < 3 (low).  Iron saturation was 7% with a TIBC of 317.  LDH was 205 (98-192).  Retic was 2.6%.  Creatinine was 1.37 (CrCl 47.3 ml/min).  Peripheral smear revealed leukopenia with normal WBC morphology. Normocytic anemia with polychromasia, anisocytosis and rouleaux formation.   Work-up on 11/08/2017 revealed a hematocrit of 27.2, hemoglobin 9.0, platelets 200,000, WBC 5800 with an [place] of 4100.  Ferritin was 86.  Iron saturation was 6% with a TIBC of 382.  Sed rate was 56 (0-20).  Cold agglutinins were negative.  Reticulocyte count was 1.2%  He received weekly Venofer x 2 (11/25/2017 - 12/02/2017).  Ferritin has  been followed: 106 on 06/24/2017, 65 on 10/30/2017, 86 on 11/08/2017, 124 on 12/12/2017, and 106 on 12/15/2017.  Iron saturation was 6% on 06/24/2017.  Sed rate was 56 on 11/08/2017 and 64 on 12/05/2017.  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  He has treated with Septra then switched to doxycycline.  He underwent back surgery on 10/20/2017.  Symptomatically, he feels tired.  He is back on testosterone.  Exam is stable.  Hemoglobin is 8.7.  WBC is 2900 (ANC 1700).  Plan: 1.  Review labs from yesterday and trend in the past 1 month.  Hemoglobin has improved from 8.1 to 8.7.  WBC remains low (2900).  Ferritin appears adequate (> 100) although sed rate remains high and thus may be falsely elevated.  Discuss Venofer trial to assess improvement in hemoglobin with increasing iron level. 2.  RTC weekly x 2  for Venofer. 3.  Discuss use of testosterone. 4.  Discuss rheumatoid arthritis and possible anemia of chronic disease. 5.  RTC in 5 weeks for labs (CBC with diff, sed rate, iron studies, ANA with reflex, copper, zinc). 6.  RTC in 6 weeks for MD assessment, review of labs, and +/- Venofer.   Lequita Asal, MD  12/16/2017, 4:35 PM

## 2017-12-16 NOTE — Progress Notes (Signed)
No treatment today per [place]/Dr. [name]

## 2017-12-19 DIAGNOSIS — M4326 Fusion of spine, lumbar region: Secondary | ICD-10-CM | POA: Diagnosis not present

## 2017-12-19 DIAGNOSIS — E119 Type 2 diabetes mellitus without complications: Secondary | ICD-10-CM | POA: Diagnosis not present

## 2017-12-19 DIAGNOSIS — I10 Essential (primary) hypertension: Secondary | ICD-10-CM | POA: Diagnosis not present

## 2017-12-19 DIAGNOSIS — T84216D Breakdown (mechanical) of internal fixation device of vertebrae, subsequent encounter: Secondary | ICD-10-CM | POA: Diagnosis not present

## 2017-12-19 DIAGNOSIS — F329 Major depressive disorder, single episode, unspecified: Secondary | ICD-10-CM | POA: Diagnosis not present

## 2017-12-19 DIAGNOSIS — I2510 Atherosclerotic heart disease of native coronary artery without angina pectoris: Secondary | ICD-10-CM | POA: Diagnosis not present

## 2017-12-21 ENCOUNTER — Other Ambulatory Visit: Payer: Medicare Other

## 2017-12-22 ENCOUNTER — Ambulatory Visit: Payer: Medicare Other

## 2017-12-22 ENCOUNTER — Ambulatory Visit: Payer: Medicare Other | Admitting: Urgent Care

## 2017-12-22 DIAGNOSIS — I2510 Atherosclerotic heart disease of native coronary artery without angina pectoris: Secondary | ICD-10-CM | POA: Diagnosis not present

## 2017-12-22 DIAGNOSIS — F329 Major depressive disorder, single episode, unspecified: Secondary | ICD-10-CM | POA: Diagnosis not present

## 2017-12-22 DIAGNOSIS — M4326 Fusion of spine, lumbar region: Secondary | ICD-10-CM | POA: Diagnosis not present

## 2017-12-22 DIAGNOSIS — E119 Type 2 diabetes mellitus without complications: Secondary | ICD-10-CM | POA: Diagnosis not present

## 2017-12-22 DIAGNOSIS — T84216D Breakdown (mechanical) of internal fixation device of vertebrae, subsequent encounter: Secondary | ICD-10-CM | POA: Diagnosis not present

## 2017-12-22 DIAGNOSIS — I10 Essential (primary) hypertension: Secondary | ICD-10-CM | POA: Diagnosis not present

## 2017-12-23 ENCOUNTER — Other Ambulatory Visit: Payer: Self-pay | Admitting: *Deleted

## 2017-12-23 ENCOUNTER — Inpatient Hospital Stay: Payer: Medicare Other | Attending: Hematology and Oncology

## 2017-12-23 VITALS — BP 103/60 | HR 62 | Temp 97.0°F | Resp 18

## 2017-12-23 DIAGNOSIS — R700 Elevated erythrocyte sedimentation rate: Secondary | ICD-10-CM | POA: Insufficient documentation

## 2017-12-23 DIAGNOSIS — E291 Testicular hypofunction: Secondary | ICD-10-CM | POA: Diagnosis not present

## 2017-12-23 DIAGNOSIS — K5900 Constipation, unspecified: Secondary | ICD-10-CM | POA: Insufficient documentation

## 2017-12-23 DIAGNOSIS — M069 Rheumatoid arthritis, unspecified: Secondary | ICD-10-CM | POA: Diagnosis not present

## 2017-12-23 DIAGNOSIS — I6523 Occlusion and stenosis of bilateral carotid arteries: Secondary | ICD-10-CM | POA: Diagnosis not present

## 2017-12-23 DIAGNOSIS — D72819 Decreased white blood cell count, unspecified: Secondary | ICD-10-CM | POA: Insufficient documentation

## 2017-12-23 DIAGNOSIS — D509 Iron deficiency anemia, unspecified: Secondary | ICD-10-CM | POA: Insufficient documentation

## 2017-12-23 DIAGNOSIS — D696 Thrombocytopenia, unspecified: Secondary | ICD-10-CM

## 2017-12-23 DIAGNOSIS — D61818 Other pancytopenia: Secondary | ICD-10-CM | POA: Insufficient documentation

## 2017-12-23 DIAGNOSIS — Z79899 Other long term (current) drug therapy: Secondary | ICD-10-CM | POA: Diagnosis not present

## 2017-12-23 MED ORDER — IRON SUCROSE 20 MG/ML IV SOLN
200.0000 mg | Freq: Once | INTRAVENOUS | Status: AC
  Administered 2017-12-23: 200 mg via INTRAVENOUS
  Filled 2017-12-23: qty 10

## 2017-12-23 MED ORDER — SODIUM CHLORIDE 0.9 % IV SOLN
Freq: Once | INTRAVENOUS | Status: AC
  Administered 2017-12-23: 14:00:00 via INTRAVENOUS
  Filled 2017-12-23: qty 1000

## 2017-12-23 MED ORDER — SODIUM CHLORIDE 0.9 % IV SOLN: 200.0000 mg | Freq: Once | INTRAVENOUS | Status: DC

## 2017-12-27 DIAGNOSIS — T84216D Breakdown (mechanical) of internal fixation device of vertebrae, subsequent encounter: Secondary | ICD-10-CM | POA: Diagnosis not present

## 2017-12-27 DIAGNOSIS — F329 Major depressive disorder, single episode, unspecified: Secondary | ICD-10-CM | POA: Diagnosis not present

## 2017-12-27 DIAGNOSIS — E119 Type 2 diabetes mellitus without complications: Secondary | ICD-10-CM | POA: Diagnosis not present

## 2017-12-27 DIAGNOSIS — I10 Essential (primary) hypertension: Secondary | ICD-10-CM | POA: Diagnosis not present

## 2017-12-27 DIAGNOSIS — I2510 Atherosclerotic heart disease of native coronary artery without angina pectoris: Secondary | ICD-10-CM | POA: Diagnosis not present

## 2017-12-27 DIAGNOSIS — M4326 Fusion of spine, lumbar region: Secondary | ICD-10-CM | POA: Diagnosis not present

## 2017-12-28 DIAGNOSIS — I10 Essential (primary) hypertension: Secondary | ICD-10-CM | POA: Diagnosis not present

## 2017-12-28 DIAGNOSIS — T84216D Breakdown (mechanical) of internal fixation device of vertebrae, subsequent encounter: Secondary | ICD-10-CM | POA: Diagnosis not present

## 2017-12-28 DIAGNOSIS — E119 Type 2 diabetes mellitus without complications: Secondary | ICD-10-CM | POA: Diagnosis not present

## 2017-12-28 DIAGNOSIS — I2510 Atherosclerotic heart disease of native coronary artery without angina pectoris: Secondary | ICD-10-CM | POA: Diagnosis not present

## 2017-12-28 DIAGNOSIS — M4326 Fusion of spine, lumbar region: Secondary | ICD-10-CM | POA: Diagnosis not present

## 2017-12-28 DIAGNOSIS — F329 Major depressive disorder, single episode, unspecified: Secondary | ICD-10-CM | POA: Diagnosis not present

## 2017-12-30 ENCOUNTER — Inpatient Hospital Stay: Payer: Medicare Other

## 2017-12-30 VITALS — BP 149/73 | HR 64 | Temp 98.8°F | Resp 20

## 2017-12-30 DIAGNOSIS — M069 Rheumatoid arthritis, unspecified: Secondary | ICD-10-CM | POA: Diagnosis not present

## 2017-12-30 DIAGNOSIS — E291 Testicular hypofunction: Secondary | ICD-10-CM | POA: Diagnosis not present

## 2017-12-30 DIAGNOSIS — R700 Elevated erythrocyte sedimentation rate: Secondary | ICD-10-CM | POA: Diagnosis not present

## 2017-12-30 DIAGNOSIS — K5900 Constipation, unspecified: Secondary | ICD-10-CM | POA: Diagnosis not present

## 2017-12-30 DIAGNOSIS — D509 Iron deficiency anemia, unspecified: Secondary | ICD-10-CM | POA: Diagnosis not present

## 2017-12-30 DIAGNOSIS — D61818 Other pancytopenia: Secondary | ICD-10-CM | POA: Diagnosis not present

## 2017-12-30 MED ORDER — IRON SUCROSE 20 MG/ML IV SOLN
200.0000 mg | Freq: Once | INTRAVENOUS | Status: AC
  Administered 2017-12-30: 200 mg via INTRAVENOUS
  Filled 2017-12-30: qty 10

## 2017-12-30 MED ORDER — SODIUM CHLORIDE 0.9 % IV SOLN
Freq: Once | INTRAVENOUS | Status: AC
  Administered 2017-12-30: 14:00:00 via INTRAVENOUS
  Filled 2017-12-30: qty 1000

## 2018-01-12 ENCOUNTER — Ambulatory Visit (INDEPENDENT_AMBULATORY_CARE_PROVIDER_SITE_OTHER): Payer: Medicare Other | Admitting: Endocrinology

## 2018-01-12 ENCOUNTER — Encounter: Payer: Self-pay | Admitting: Endocrinology

## 2018-01-12 VITALS — BP 104/58 | HR 76 | Ht 71.0 in | Wt 166.4 lb

## 2018-01-12 DIAGNOSIS — M8000XS Age-related osteoporosis with current pathological fracture, unspecified site, sequela: Secondary | ICD-10-CM

## 2018-01-12 DIAGNOSIS — N179 Acute kidney failure, unspecified: Secondary | ICD-10-CM | POA: Diagnosis not present

## 2018-01-12 DIAGNOSIS — E559 Vitamin D deficiency, unspecified: Secondary | ICD-10-CM | POA: Diagnosis not present

## 2018-01-12 DIAGNOSIS — I6523 Occlusion and stenosis of bilateral carotid arteries: Secondary | ICD-10-CM | POA: Diagnosis not present

## 2018-01-12 LAB — MAGNESIUM: Magnesium: 1.8 mg/dL (ref 1.5–2.5)

## 2018-01-12 LAB — PHOSPHORUS: Phosphorus: 4.3 mg/dL (ref 2.3–4.6)

## 2018-01-12 NOTE — Progress Notes (Signed)
Subjective:    Patient ID: Bryan [name], male    DOB: Apr 11, 1941, 77 y.o.   MRN: [id number]  HPI Pt is referred by Dr Caryl Bis, for osteoporosis.  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  he has never been on medication for this.  he has never had any bony fracture, other than spinal fxs.  he has no history of any of the following: multiple myeloma, renal dz, thyroid problems, prolonged bedrest, alcoholism, smoking, liver dz, primary hyperparathyroidism.  he does not take heparin or anticonvulsants.  He takes steroids intermittently, for RA.  He has not recently taken vit-D.  He has moderate pain at the mid-back, and assoc numbness of the toes.   Past Medical History:  Diagnosis Date  . Abnormal CT scan    Asymmetric left rectal wall thickening   . Anemia   . Anxiety   . Arrhythmia   . Chronic lower back pain   . Complication of anesthesia    Memory loss 09/2015  . Coronary artery disease   . Depression   . GERD (gastroesophageal reflux disease)   . Glaucoma   . H/O thoracic aortic aneurysm repair   . Heart murmur   . History of being hospitalized    memory lose kidney funtion down blood pressure up  . History of blood transfusion    "think he had one when he had heart valve OR" (10/14/2016); "none since" (10/19/2017)  . History of chicken pox   . Hyperlipidemia   . Hypertension   . Lumbar stenosis   . Neuropathy   . Osteoarthritis    worse in feet and ankles  . Paroxysmal A-fib ([place])   . Poor short term memory    takes Aricept  . Rheumatoid arthritis ([place])    "all over" (10/14/2016)  . Schizophrenia ([place])   . Seasonal allergies   . Valvular heart disease     Past Surgical History:  Procedure Laterality Date  . AORTIC VALVE REPLACEMENT  2007   [facility]  [place], [place]; "pig valve"  . APPENDECTOMY  2010  . BACK SURGERY    . CARDIAC VALVE REPLACEMENT    . CATARACT EXTRACTION W/ INTRAOCULAR LENS  IMPLANT, BILATERAL Bilateral 2012  .  COLONOSCOPY WITH PROPOFOL N/A 09/24/2016   Procedure: COLONOSCOPY WITH PROPOFOL;  Surgeon: Jonathon Bellows, MD;  Location[facility] ENDOSCOPY;  Service: Endoscopy;  Laterality: N/A;  . ESOPHAGOGASTRODUODENOSCOPY (EGD) WITH PROPOFOL N/A 09/24/2016   Procedure: ESOPHAGOGASTRODUODENOSCOPY (EGD) WITH PROPOFOL;  Surgeon: Jonathon Bellows, MD;  Location: [facility] ENDOSCOPY;  Service: Endoscopy;  Laterality: N/A;  . GIVENS CAPSULE STUDY N/A 09/24/2016   Procedure: GIVENS CAPSULE STUDY;  Surgeon: Jonathon Bellows, MD;  Location: [facility] ENDOSCOPY;  Service: Endoscopy;  Laterality: N/A;  . HAMMER TOE SURGERY Right 10/09/2015   Procedure: HAMMER TOE REPAIR WITH K-WIRE FIXATION RIGHT SECOND TOE;  Surgeon: Albertine Patricia, DPM;  Location: [place];  Service: Podiatry;  Laterality: Right;  WITH LOCAL  . JOINT REPLACEMENT    . LAMINECTOMY WITH POSTERIOR LATERAL ARTHRODESIS LEVEL 3 N/A 09/28/2017   Procedure: LUMBAR  POSTEROR  FUSION REVISION - LUMBAR ONE -TWO, LUMBAR TWO -THREE,THREE-FOUR ,BILATERAL ARTHRODESIS REMOVAL LUMBAR THREE HARDWARE;  Surgeon: Kary Kos, MD;  Location: [place];  Service: Neurosurgery;  Laterality: N/A;  . LAMINECTOMY WITH POSTERIOR LATERAL ARTHRODESIS LEVEL 3 N/A 10/20/2017   Procedure: Revision fusion and removal of hardware Lumbar one, Pedicle screw fixation thoracic ten-lumbar two, thoracic nine-ten laminotomy, Posterior Lumbar  Arthrodesis thoracic ten-lumbar two ;  Surgeon: Kary Kos, MD;  Location: [place];  Service: Neurosurgery;  Laterality: N/A;  . LAPAROSCOPIC CHOLECYSTECTOMY  2010  . LUMBAR FUSION Right 06/08/2017   LUMBAR THREE-FOUR, LUMBAR FOUR-FIVE POSTEROLATERAL ARTHRODESIS WITH RIGHT LUMBAR FOUR-FIVE LAMINECTOMY/FORAMINOTOMY  . LUMBAR LAMINECTOMY/DECOMPRESSION MICRODISCECTOMY Right 03/08/2016   Procedure: Laminectomy and Foraminotomy - Lumbar Five-Sacral One Right;  Surgeon: Kary Kos, MD;  Location: [place];  Service: Neurosurgery;  Laterality: Right;  Right  . MULTIPLE TOOTH EXTRACTIONS     "3"    . POSTERIOR LUMBAR FUSION  10/14/2016   L5-S1  . REPLACEMENT TOTAL KNEE Left 2003  . THORACIC AORTIC ANEURYSM REPAIR  2010    Social History   Socioeconomic History  . Marital status: Married    Spouse name: Not on file  . Number of children: Not on file  . Years of education: Not on file  . Highest education level: Not on file  Occupational History  . Occupation: retired  Scientific laboratory technician  . Financial resource strain: Not on file  . Food insecurity:    Worry: Not on file    Inability: Not on file  . Transportation needs:    Medical: Not on file    Non-medical: Not on file  Tobacco Use  . Smoking status: Former Smoker    Packs/day: 1.00    Years: 32.00    Pack years: 32.00    Types: Cigarettes    Last attempt to quit: 07/05/1981    Years since quitting: 36.5  . Smokeless tobacco: Never Used  Substance and Sexual Activity  . Alcohol use: Yes    Comment: 10/19/2017 "glass of wine/month; if that"  . Drug use: Never  . Sexual activity: Not Currently  Lifestyle  . Physical activity:    Days per week: Not on file    Minutes per session: Not on file  . Stress: Not on file  Relationships  . Social connections:    Talks on phone: Not on file    Gets together: Not on file    Attends religious service: Not on file    Active member of club or organization: Not on file    Attends meetings of clubs or organizations: Not on file    Relationship status: Not on file  . Intimate partner violence:    Fear of current or ex partner: Not on file    Emotionally abused: Not on file    Physically abused: Not on file    Forced sexual activity: Not on file  Other Topics Concern  . Not on file  Social History Narrative  . Not on file    Current Outpatient Medications on File Prior to Visit  Medication Sig Dispense Refill  . acetaminophen (TYLENOL) 325 MG tablet Take 650 mg by mouth 4 (four) times daily.    Marland Kitchen amLODipine (NORVASC) 5 MG tablet TAKE ONE TABLET BY MOUTH EVERY DAY 90  tablet 1  . amoxicillin (AMOXIL) 500 MG capsule Take 2,000 mg by mouth once as needed. 1 hour prior to dental appointment    . aspirin 325 MG EC tablet Take 325 mg by mouth every evening.     Marland Kitchen atorvastatin (LIPITOR) 40 MG tablet Take 1 tablet (40 mg total) by mouth daily. (Patient taking differently: Take 40 mg by mouth daily at 6 PM. ) 90 tablet 3  . brimonidine (ALPHAGAN) 0.2 % ophthalmic solution     . cetirizine (ZYRTEC) 10 MG tablet Take 10 mg by mouth  daily.    . cyclobenzaprine (FLEXERIL) 10 MG tablet Take 1 tablet (10 mg total) by mouth 2 (two) times daily as needed for muscle spasms. 30 tablet 0  . donepezil (ARICEPT) 5 MG tablet Take 5 mg by mouth at bedtime.     . fenofibrate 160 MG tablet TAKE ONE TABLET BY MOUTH EVERY DAY (Patient taking differently: TAKE ONE TABLET BY MOUTH EVERY EVENING) 30 tablet 3  . FLUoxetine (PROZAC) 20 MG capsule TAKE 1 CAPSULE BY MOUTH EVERY DAY (Patient taking differently: TAKE 1 CAPSULE (20MG) BY MOUTH EVERY DAY) 90 capsule 1  . folic acid (FOLVITE) 1 MG tablet Take 1 mg by mouth 2 (two) times daily.    Marland Kitchen gabapentin (NEURONTIN) 400 MG capsule Take 400 mg by mouth 3 (three) times daily.     Marland Kitchen GAVILAX powder USE 1 CAPFUL IN 4-8 OUNCES OF LIQUID DAILY AS DIRECTED (Patient taking differently: USE 1 CAPFUL IN 4-8 OUNCES OF LIQUID DAILY AS NEEDED FOR CONSTIPATION) 510 g 0  . HYDROcodone-acetaminophen (NORCO) 10-325 MG tablet Take 1 tablet by mouth every 6 (six) hours as needed for moderate pain. (Patient taking differently: Take 1 tablet by mouth every 4 (four) hours as needed for moderate pain. ) 20 tablet 0  . hydrocortisone cream 1 % Apply 1 application topically daily as needed for itching.    . lidocaine (LIDODERM) 5 % Place 1 patch onto the skin daily as needed (pain). Remove & Discard patch within 12 hours or as directed by MD     . lisinopril (PRINIVIL,ZESTRIL) 20 MG tablet TAKE ONE TABLET BY MOUTH EVERY EVENING (Patient taking differently: TAKE ONE TABLET  (20MG) BY MOUTH EVERY EVENING) 90 tablet 3  . mirabegron ER (MYRBETRIQ) 50 MG TB24 tablet Take 50 mg by mouth daily.    Marland Kitchen omeprazole (PRILOSEC) 40 MG capsule TAKE 1 CAPSULE BY MOUTH DAILY (Patient taking differently: TAKE 1 CAPSULE (40MG) BY MOUTH EVERY EVENING) 30 capsule 3  . tamsulosin (FLOMAX) 0.4 MG CAPS capsule TAKE 1 CAPSULE BY MOUTH DAILY (Patient taking differently: TAKE 1 CAPSULE (0.4MG) BY MOUTH EVERY EVENING) 90 capsule 0  . testosterone cypionate (DEPOTESTOSTERONE CYPIONATE) 200 MG/ML injection INJECT 30m INTO THE MUSCLE EVERY 14 DAYS 10 mL 0  . traZODone (DESYREL) 50 MG tablet Take 100 mg by mouth at bedtime.     No current facility-administered medications on file prior to visit.     Allergies  Allergen Reactions  . Penicillins Hives, Swelling and Other (See Comments)    SWELLING REACTION UNSPECIFIED PATIENT HAS TAKEN AMOXICILLIN ON MED HX FROM [facility] PATIENT HAS HAD A PCN REACTION WITH IMMEDIATE RASH, FACIAL/TONGUE/THROAT SWELLING, SOB, OR LIGHTHEADEDNESS WITH HYPOTENSION:  #  #  #  YES  #  #  #   Has patient had a PCN reaction causing severe rash involving mucus membranes or skin necrosis: No Has patient had a PCN reaction that required hospitalization No Has patient had a PCN reaction occurring within the last 10 years: No  . Demerol [Meperidine] Hives and Nausea And Vomiting    Family History  Problem Relation Age of Onset  . Heart failure Mother   . Hypertension Mother   . Asthma Mother   . Osteoporosis Mother   . Heart attack Father 582      MI  . Hypertension Sister   . Prostate cancer Neg Hx   . Bladder Cancer Neg Hx   . Kidney cancer Neg Hx     BP (!) 104/58 (  BP Location: Left Arm, Patient Position: Sitting, Cuff Size: Normal)   Pulse 76   Ht _0  (1.803 m)   Wt 166 lb 6.4 oz (75.5 kg)   SpO2 97%   BMI 23.21 kg/m     Review of Systems denies hematuria, heartburn, cold intolerance, edema, skin rash, insomnia, heartburn, falls, cramps, and memory  loss.  He has lost 30 lbs, x 2 years.  He has fatigue, rhinorrhea, and easy bruising.     Objective:   Physical Exam VS: see vs page GEN: no distress HEAD: head: no deformity eyes: no periorbital swelling, no proptosis.  external nose and ears are normal mouth: no lesion seen NECK: supple, thyroid is not enlarged CHEST WALL: no deformity LUNGS: clear to auscultation CV: reg rate and rhythm; soft systolic murmur ABD: abdomen is soft, nontender.  no hepatosplenomegaly.  not distended.  no hernia MUSCULOSKELETAL: muscle bulk and strength are grossly normal.  no obvious joint swelling.  gait is slow but steady.  Old healed surgical scar (midline) at the mid and lower back) EXTEMITIES: no deformity.  no ulcer on the feet.  feet are of normal color and temp.  no edema.  There is bilateral onychomycosis of the toenails.  PULSES: dorsalis pedis intact bilat.  no carotid bruit NEURO:  cn 2-12 grossly intact.   readily moves all 4's.  sensation is intact to touch on the feet SKIN:  Normal texture and temperature.  No rash or suspicious lesion is visible.   Few ecchymoses on the forearms.  NODES:  None palpable at the neck PSYCH: alert, well-oriented.  Does not appear anxious nor depressed.    He signs release of information, from [facility].  Lab Results  Component Value Date   TSH 1.598 10/31/2017   Lab Results  Component Value Date   CALCIUM 9.2 12/15/2017   CT (2019): L1 burst fracture, further height loss from Oct 19, 2017. Acute LEFT L5 and LEFT sacrum nondisplaced fractures. Acute versus subacute LEFT L2 transverse process fracture. 2. Old moderate L4 compression fracture, status post cement augmentation.    Assessment & Plan:  Osteoporosis, new to me.  She needs f/u DEXA, if it was not recently done at [facility].  She needs medication.   Patient Instructions  It is critically important to prevent falling down (keep floor areas well-lit, dry, and free of loose objects.  If  you have a cane, walker, or wheelchair, you should use it, even for short trips around the house.  Wear flat-soled shoes.  Also, try not to rush). We are requesting the bone density results from [place].  We will call you when we receive.  If we have not called you in 1 week, please call us If they don't have it, I'll request it for you to be done in [place].   blood tests are requested for you today.  We'll let you know about the results.  you should take medication for osteoporosis.  The results will tell us more specifically what you need.   Please come back for a follow-up appointment in 6 months.

## 2018-01-12 NOTE — Patient Instructions (Addendum)
It is critically important to prevent falling down (keep floor areas well-lit, dry, and free of loose objects.  If you have a cane, walker, or wheelchair, you should use it, even for short trips around the house.  Wear flat-soled shoes.  Also, try not to rush). We are requesting the bone density results from [place].  We will call you when we receive.  If we have not called you in 1 week, please call us If they don't have it, I'll request it for you to be done in [place].   blood tests are requested for you today.  We'll let you know about the results.  you should take medication for osteoporosis.  The results will tell us more specifically what you need.   Please come back for a follow-up appointment in 6 months.

## 2018-01-13 LAB — PTH, INTACT AND CALCIUM
Calcium: 8.7 mg/dL (ref 8.6–10.3)
PTH: 25 pg/mL (ref 14–64)

## 2018-01-16 DIAGNOSIS — M545 Low back pain: Secondary | ICD-10-CM | POA: Diagnosis not present

## 2018-01-18 DIAGNOSIS — M545 Low back pain: Secondary | ICD-10-CM | POA: Diagnosis not present

## 2018-01-20 ENCOUNTER — Inpatient Hospital Stay: Payer: Medicare Other

## 2018-01-20 ENCOUNTER — Other Ambulatory Visit: Payer: Self-pay

## 2018-01-20 DIAGNOSIS — D696 Thrombocytopenia, unspecified: Secondary | ICD-10-CM

## 2018-01-20 DIAGNOSIS — M545 Low back pain: Secondary | ICD-10-CM | POA: Diagnosis not present

## 2018-01-20 DIAGNOSIS — D61818 Other pancytopenia: Secondary | ICD-10-CM | POA: Diagnosis not present

## 2018-01-20 DIAGNOSIS — D509 Iron deficiency anemia, unspecified: Secondary | ICD-10-CM | POA: Diagnosis not present

## 2018-01-20 DIAGNOSIS — R700 Elevated erythrocyte sedimentation rate: Secondary | ICD-10-CM | POA: Diagnosis not present

## 2018-01-20 DIAGNOSIS — K5900 Constipation, unspecified: Secondary | ICD-10-CM | POA: Diagnosis not present

## 2018-01-20 DIAGNOSIS — M069 Rheumatoid arthritis, unspecified: Secondary | ICD-10-CM | POA: Diagnosis not present

## 2018-01-20 DIAGNOSIS — E291 Testicular hypofunction: Secondary | ICD-10-CM | POA: Diagnosis not present

## 2018-01-20 LAB — CBC WITH DIFFERENTIAL/PLATELET
Basophils Absolute: 0 10*3/uL (ref 0–0.1)
Basophils Relative: 1 %
Eosinophils Absolute: 0.2 10*3/uL (ref 0–0.7)
Eosinophils Relative: 6 %
HCT: 30.5 % — ABNORMAL LOW (ref 40.0–52.0)
Hemoglobin: 9.9 g/dL — ABNORMAL LOW (ref 13.0–18.0)
Lymphocytes Relative: 14 %
Lymphs Abs: 0.5 10*3/uL — ABNORMAL LOW (ref 1.0–3.6)
MCH: 28.1 pg (ref 26.0–34.0)
MCHC: 32.4 g/dL (ref 32.0–36.0)
MCV: 86.9 fL (ref 80.0–100.0)
Monocytes Absolute: 0.4 10*3/uL (ref 0.2–1.0)
Monocytes Relative: 11 %
Neutro Abs: 2.3 10*3/uL (ref 1.4–6.5)
Neutrophils Relative %: 68 %
Platelets: 117 10*3/uL — ABNORMAL LOW (ref 150–440)
RBC: 3.51 MIL/uL — ABNORMAL LOW (ref 4.40–5.90)
RDW: 18 % — ABNORMAL HIGH (ref 11.5–14.5)
WBC: 3.3 10*3/uL — ABNORMAL LOW (ref 3.8–10.6)

## 2018-01-20 LAB — IRON AND TIBC
Iron: 20 ug/dL — ABNORMAL LOW (ref 45–182)
Saturation Ratios: 6 % — ABNORMAL LOW (ref 17.9–39.5)
TIBC: 342 ug/dL (ref 250–450)
UIBC: 322 ug/dL

## 2018-01-20 LAB — RETICULOCYTES
RBC.: 3.45 MIL/uL — ABNORMAL LOW (ref 4.40–5.90)
Retic Count, Absolute: 41.4 10*3/uL (ref 19.0–183.0)
Retic Ct Pct: 1.2 % (ref 0.4–3.1)

## 2018-01-20 LAB — FERRITIN: Ferritin: 95 ng/mL (ref 24–336)

## 2018-01-20 LAB — SEDIMENTATION RATE: Sed Rate: 34 mm/hr — ABNORMAL HIGH (ref 0–20)

## 2018-01-22 ENCOUNTER — Encounter: Payer: Self-pay | Admitting: Hematology and Oncology

## 2018-01-22 LAB — ZINC: Zinc: 52 ug/dL — ABNORMAL LOW (ref 56–134)

## 2018-01-22 LAB — COPPER, SERUM: Copper: 126 ug/dL (ref 72–166)

## 2018-01-24 DIAGNOSIS — M545 Low back pain: Secondary | ICD-10-CM | POA: Diagnosis not present

## 2018-01-25 LAB — ANTINUCLEAR ANTIBODIES, IFA: ANA Ab, IFA: NEGATIVE

## 2018-01-26 DIAGNOSIS — M545 Low back pain: Secondary | ICD-10-CM | POA: Diagnosis not present

## 2018-01-27 ENCOUNTER — Inpatient Hospital Stay: Payer: Medicare Other | Attending: Hematology and Oncology

## 2018-01-27 ENCOUNTER — Encounter: Payer: Self-pay | Admitting: Hematology and Oncology

## 2018-01-27 ENCOUNTER — Inpatient Hospital Stay (HOSPITAL_BASED_OUTPATIENT_CLINIC_OR_DEPARTMENT_OTHER): Payer: Medicare Other | Admitting: Hematology and Oncology

## 2018-01-27 ENCOUNTER — Inpatient Hospital Stay: Payer: Medicare Other

## 2018-01-27 VITALS — BP 124/69 | HR 63 | Temp 97.2°F | Wt 169.4 lb

## 2018-01-27 DIAGNOSIS — E60 Dietary zinc deficiency: Secondary | ICD-10-CM | POA: Diagnosis not present

## 2018-01-27 DIAGNOSIS — D509 Iron deficiency anemia, unspecified: Secondary | ICD-10-CM

## 2018-01-27 DIAGNOSIS — D72819 Decreased white blood cell count, unspecified: Secondary | ICD-10-CM

## 2018-01-27 DIAGNOSIS — D696 Thrombocytopenia, unspecified: Secondary | ICD-10-CM

## 2018-01-27 NOTE — Progress Notes (Signed)
No venofer treatment today.

## 2018-01-27 NOTE — Progress Notes (Signed)
[facility] day:  01/27/2018   Chief Complaint: Bryan [name] is a 77 y.o. male with rheumatoid arthritis, iron deficiency anemia, and mild pancytopenia who is seen for 6 week assessment.  HPI:  The patient was last seen in the hematology clinic on  12/16/2017.  At that time, he felt tired.  He was back on testosterone.  Exam was stable.  Hemoglobin was 8.7.  WBC was 2900 (ANC 1700).  At that time, ferritin appeared adequate (> 100) although sed rate remained high and thus was possibly falsely elevated.  We discussed a Venofer trial to assess improvement in hemoglobin with increasing iron level.  He received weekly Venofer x 2 (12/23/2017 - 12/30/2017).  Labs on 01/20/2018 revealed a hematocrit of 30.5, hemoglobin 9.9, MCV 86.9, platelets 117,000, WBC 3300 with an ANC of 2300.  Copper was 126 (normal).  Zinc was 52 (56-134).  Ferritin was 95.  Iron saturation was 6% with a TIBC of 342.  Sed rate was 34.  ANA was negative.  Symptomatically, he still feels tired and drug out.  He has back pain (4 out of 10).   He has osteoporosis.  He has been referred to an endocrinologist, Dr. Renato Shin.   Past Medical History:  Diagnosis Date  . Abnormal CT scan    Asymmetric left rectal wall thickening   . Anemia   . Anxiety   . Arrhythmia   . Chronic lower back pain   . Complication of anesthesia    Memory loss 09/2015  . Coronary artery disease   . Depression   . GERD (gastroesophageal reflux disease)   . Glaucoma   . H/O thoracic aortic aneurysm repair   . Heart murmur   . History of being hospitalized    memory lose kidney funtion down blood pressure up  . History of blood transfusion    "think he had one when he had heart valve OR" (10/14/2016); "none since" (10/19/2017)  . History of chicken pox   . Hyperlipidemia   . Hypertension   . Lumbar stenosis   . Neuropathy   . Osteoarthritis    worse in feet and ankles  . Paroxysmal A-fib  ([place])   . Poor short term memory    takes Aricept  . Rheumatoid arthritis ([place])    "all over" (10/14/2016)  . Schizophrenia ([place])   . Seasonal allergies   . Valvular heart disease     Past Surgical History:  Procedure Laterality Date  . AORTIC VALVE REPLACEMENT  2007   [facility]  [place], [place]; "pig valve"  . APPENDECTOMY  2010  . BACK SURGERY    . CARDIAC VALVE REPLACEMENT    . CATARACT EXTRACTION W/ INTRAOCULAR LENS  IMPLANT, BILATERAL Bilateral 2012  . COLONOSCOPY WITH PROPOFOL N/A 09/24/2016   Procedure: COLONOSCOPY WITH PROPOFOL;  Surgeon: Jonathon Bellows, MD;  Location: [facility] ENDOSCOPY;  Service: Endoscopy;  Laterality: N/A;  . ESOPHAGOGASTRODUODENOSCOPY (EGD) WITH PROPOFOL N/A 09/24/2016   Procedure: ESOPHAGOGASTRODUODENOSCOPY (EGD) WITH PROPOFOL;  Surgeon: Jonathon Bellows, MD;  Location: [facility] ENDOSCOPY;  Service: Endoscopy;  Laterality: N/A;  . GIVENS CAPSULE STUDY N/A 09/24/2016   Procedure: GIVENS CAPSULE STUDY;  Surgeon: Jonathon Bellows, MD;  Location: [facility] ENDOSCOPY;  Service: Endoscopy;  Laterality: N/A;  . HAMMER TOE SURGERY Right 10/09/2015   Procedure: HAMMER TOE REPAIR WITH K-WIRE FIXATION RIGHT SECOND TOE;  Surgeon: Albertine Patricia, DPM;  Location: [facility];  Service: Podiatry;  Laterality: Right;  WITH LOCAL  . JOINT REPLACEMENT    . LAMINECTOMY WITH POSTERIOR LATERAL ARTHRODESIS LEVEL 3 N/A 09/28/2017   Procedure: LUMBAR  POSTEROR  FUSION REVISION - LUMBAR ONE -TWO, LUMBAR TWO -THREE,THREE-FOUR ,BILATERAL ARTHRODESIS REMOVAL LUMBAR THREE HARDWARE;  Surgeon: Kary Kos, MD;  Location: [facility];  Service: Neurosurgery;  Laterality: N/A;  . LAMINECTOMY WITH POSTERIOR LATERAL ARTHRODESIS LEVEL 3 N/A 10/20/2017   Procedure: Revision fusion and removal of hardware Lumbar one, Pedicle screw fixation thoracic ten-lumbar two, thoracic nine-ten laminotomy, Posterior Lumbar Arthrodesis thoracic ten-lumbar two ;  Surgeon: Kary Kos, MD;  Location: [facility];  Service: Neurosurgery;   Laterality: N/A;  . LAPAROSCOPIC CHOLECYSTECTOMY  2010  . LUMBAR FUSION Right 06/08/2017   LUMBAR THREE-FOUR, LUMBAR FOUR-FIVE POSTEROLATERAL ARTHRODESIS WITH RIGHT LUMBAR FOUR-FIVE LAMINECTOMY/FORAMINOTOMY  . LUMBAR LAMINECTOMY/DECOMPRESSION MICRODISCECTOMY Right 03/08/2016   Procedure: Laminectomy and Foraminotomy - Lumbar Five-Sacral One Right;  Surgeon: Kary Kos, MD;  Location: [facility];  Service: Neurosurgery;  Laterality: Right;  Right  . MULTIPLE TOOTH EXTRACTIONS     "3"  . POSTERIOR LUMBAR FUSION  10/14/2016   L5-S1  . REPLACEMENT TOTAL KNEE Left 2003  . THORACIC AORTIC ANEURYSM REPAIR  2010    Family History  Problem Relation Age of Onset  . Heart failure Mother   . Hypertension Mother   . Asthma Mother   . Osteoporosis Mother   . Heart attack Father 85       MI  . Hypertension Sister   . Prostate cancer Neg Hx   . Bladder Cancer Neg Hx   . Kidney cancer Neg Hx     Social History:  reports that he quit smoking about 36 years ago. His smoking use included cigarettes. He has a 32.00 pack-year smoking history. He has never used smokeless tobacco. He reports that he drinks alcohol. He reports that he does not use drugs.  He smoked 1 pack a day x 12 years.  He stopped smoking 30 years ago.  He drinks a glass of wine occasionally.  He is a Software engineer.  [place]  The patient is alone today.  Allergies:  Allergies  Allergen Reactions  . Penicillins Hives, Swelling and Other (See Comments)    SWELLING REACTION UNSPECIFIED PATIENT HAS TAKEN AMOXICILLIN ON MED HX FROM [facility] PATIENT HAS HAD A PCN REACTION WITH IMMEDIATE RASH, FACIAL/TONGUE/THROAT SWELLING, SOB, OR LIGHTHEADEDNESS WITH HYPOTENSION:  #  #  #  YES  #  #  #   Has patient had a PCN reaction causing severe rash involving mucus membranes or skin necrosis: No Has patient had a PCN reaction that required hospitalization No Has patient had a PCN reaction occurring within the  last 10 years: No  . Demerol [Meperidine] Hives and Nausea And Vomiting    Current Medications: Current Outpatient Medications  Medication Sig Dispense Refill  . acetaminophen (TYLENOL) 325 MG tablet Take 650 mg by mouth 4 (four) times daily.    Marland Kitchen amLODipine (NORVASC) 5 MG tablet TAKE ONE TABLET BY MOUTH EVERY DAY 90 tablet 1  . aspirin 325 MG EC tablet Take 325 mg by mouth every evening.     Marland Kitchen atorvastatin (LIPITOR) 40 MG tablet Take 1 tablet (40 mg total) by mouth daily. (Patient taking differently: Take 40 mg by mouth daily at 6 PM. ) 90 tablet 3  . brimonidine (ALPHAGAN) 0.2 % ophthalmic solution     . cetirizine (ZYRTEC) 10 MG tablet Take  10 mg by mouth daily.    . cyclobenzaprine (FLEXERIL) 10 MG tablet Take 1 tablet (10 mg total) by mouth 2 (two) times daily as needed for muscle spasms. 30 tablet 0  . donepezil (ARICEPT) 5 MG tablet Take 5 mg by mouth at bedtime.     . fenofibrate 160 MG tablet TAKE ONE TABLET BY MOUTH EVERY DAY (Patient taking differently: TAKE ONE TABLET BY MOUTH EVERY EVENING) 30 tablet 3  . FLUoxetine (PROZAC) 20 MG capsule TAKE 1 CAPSULE BY MOUTH EVERY DAY (Patient taking differently: TAKE 1 CAPSULE (20MG ) BY MOUTH EVERY DAY) 90 capsule 1  . folic acid (FOLVITE) 1 MG tablet Take 1 mg by mouth 2 (two) times daily.    Marland Kitchen gabapentin (NEURONTIN) 400 MG capsule Take 400 mg by mouth 3 (three) times daily.     Marland Kitchen GAVILAX powder USE 1 CAPFUL IN 4-8 OUNCES OF LIQUID DAILY AS DIRECTED (Patient taking differently: USE 1 CAPFUL IN 4-8 OUNCES OF LIQUID DAILY AS NEEDED FOR CONSTIPATION) 510 g 0  . HYDROcodone-acetaminophen (NORCO) 10-325 MG tablet Take 1 tablet by mouth every 6 (six) hours as needed for moderate pain. (Patient taking differently: Take 1 tablet by mouth every 4 (four) hours as needed for moderate pain. ) 20 tablet 0  . hydrocortisone cream 1 % Apply 1 application topically daily as needed for itching.    . lidocaine (LIDODERM) 5 % Place 1 patch onto the skin daily  as needed (pain). Remove & Discard patch within 12 hours or as directed by MD     . lisinopril (PRINIVIL,ZESTRIL) 20 MG tablet TAKE ONE TABLET BY MOUTH EVERY EVENING (Patient taking differently: TAKE ONE TABLET (20MG ) BY MOUTH EVERY EVENING) 90 tablet 3  . mirabegron ER (MYRBETRIQ) 50 MG TB24 tablet Take 50 mg by mouth daily.    Marland Kitchen omeprazole (PRILOSEC) 40 MG capsule TAKE 1 CAPSULE BY MOUTH DAILY (Patient taking differently: TAKE 1 CAPSULE (40MG ) BY MOUTH EVERY EVENING) 30 capsule 3  . tamsulosin (FLOMAX) 0.4 MG CAPS capsule TAKE 1 CAPSULE BY MOUTH DAILY (Patient taking differently: TAKE 1 CAPSULE (0.4MG ) BY MOUTH EVERY EVENING) 90 capsule 0  . testosterone cypionate (DEPOTESTOSTERONE CYPIONATE) 200 MG/ML injection INJECT 67mL INTO THE MUSCLE EVERY 14 DAYS 10 mL 0  . traZODone (DESYREL) 50 MG tablet Take 100 mg by mouth at bedtime.     No current facility-administered medications for this visit.     Review of Systems:  GENERAL:  Feels"tired and drug out".  No fevers, sweats.  Weight down 4 pounds. PERFORMANCE STATUS (ECOG):  2 HEENT:  No visual changes, runny nose, sore throat, mouth sores or tenderness. Lungs: Shortness of breath with exertion.  No cough.  No hemoptysis. Cardiac:  No chest pain, palpitations, orthopnea, or PND. GI:  Constipation.  No nausea, vomiting, diarrhea, melena or hematochezia. GU:  No urgency, frequency, dysuria, or hematuria.  Testosterone q 2 weeks. Musculoskeletal:  No back pain.  Rheumatoid arthritis.  No muscle tenderness. Extremities:  No pain or swelling. Skin:  No rashes or skin changes. Neuro:  No headache, numbness or weakness, balance or coordination issues. Endocrine:  No diabetes, thyroid issues, hot flashes or night sweats. Psych:  No mood changes, depression or anxiety. Pain:  Back pain (4 out of 10). Review of systems:  All other systems reviewed and found to be negative.   Physical Exam: Blood pressure 124/69, pulse 63, temperature (!) 97.2 F  (36.2 C), temperature source Tympanic, weight 169 lb 6 oz (76.8  kg), SpO2 97 %. GENERAL:  Well developed, well nourished, gentleman sitting comfortably in the exam room in no acute distress.  He has a cane by his side. MENTAL STATUS:  Alert and oriented to person, place and time. HEAD:  Lilyan Punt and goatee.  Normocephalic, atraumatic, face symmetric, no Cushingoid features. EYES:  Hazel eyes.  Pupils equal round and reactive to light and accomodation.  No conjunctivitis or scleral icterus. ENT:  Oropharynx clear without lesion.  Tongue normal. Mucous membranes moist.  RESPIRATORY:  Clear to auscultation without rales, wheezes or rhonchi. CARDIOVASCULAR:  Regular rate and rhythm without murmur, rub or gallop. ABDOMEN:  Soft, non-tender, with active bowel sounds, and no hepatosplenomegaly.  No masses. SKIN:  No rashes, ulcers or lesions. EXTREMITIES: No edema, no skin discoloration or tenderness.  No palpable cords. LYMPH NODES: No palpable cervical, supraclavicular, axillary or inguinal adenopathy  NEUROLOGICAL: Unremarkable. PSYCH:  Appropriate.    No visits with results within 3 Day(s) from this visit.  Latest known visit with results is:  Orders Only on 01/20/2018  Component Date Value Ref Range Status  . ANA Ab, IFA 01/20/2018 Negative   Final   Comment: (NOTE)                                     Negative   <1:80                                     Borderline  1:80                                     Positive   >1:80 Performed At: [facility] [place], [place] [id number] Rush Farmer MD [id number]   . Zinc 01/20/2018 52* 56 - 134 ug/dL Final   Comment: (NOTE) This test was developed and its performance characteristics determined by [facility]. It has not been cleared or approved by the Food and Drug Administration.                                Detection Limit = 5 Performed At: [facility] [place], [place] [id number] Rush Farmer MD [id number]   . Copper 01/20/2018 126  72 - 166 ug/dL Final   Comment: (NOTE) This test was developed and its performance characteristics determined by [facility]. It has not been cleared or approved by the Food and Drug Administration.                                Detection Limit = 5 Performed At: [facility] [place], [place] [id number] Rush Farmer MD [id number]   . Retic Ct Pct 01/20/2018 1.2  0.4 - 3.1 % Final  . RBC. 01/20/2018 3.45* 4.40 - 5.90 MIL/uL Final  . Retic Count, Absolute 01/20/2018 41.4  19.0 - 183.0 K/uL Final   Performed at [facility], [address] [place], [place] [address]  . Iron 01/20/2018 20* 45 - 182 ug/dL Final  . TIBC 01/20/2018 342  250 - 450 ug/dL Final  . Saturation Ratios 01/20/2018 6* 17.9 - 39.5 % Final  .  UIBC 01/20/2018 322  ug/dL Final   Performed at [facility], [address] [place], [place] [address]  . Sed Rate 01/20/2018 34* 0 - 20 mm/hr Final   Performed at [facility], [address] [place], [place] [address]  . Ferritin 01/20/2018 95  24 - 336 ng/mL Final   Performed at [facility], [address] [place], [place] [address]  . WBC 01/20/2018 3.3* 3.8 - 10.6 K/uL Final  . RBC 01/20/2018 3.51* 4.40 - 5.90 MIL/uL Final  . Hemoglobin 01/20/2018 9.9* 13.0 - 18.0 g/dL Final  . HCT 01/20/2018 30.5* 40.0 - 52.0 % Final  . MCV 01/20/2018 86.9  80.0 - 100.0 fL Final  . MCH 01/20/2018 28.1  26.0 - 34.0 pg Final  . MCHC 01/20/2018 32.4  32.0 - 36.0 g/dL Final  . RDW 01/20/2018 18.0* 11.5 - 14.5 % Final  . Platelets 01/20/2018 117* 150 - 440 K/uL Final  . Neutrophils Relative % 01/20/2018 68  % Final  . Neutro Abs 01/20/2018 2.3  1.4 - 6.5 K/uL Final  . Lymphocytes Relative 01/20/2018 14  % Final  . Lymphs Abs 01/20/2018 0.5* 1.0 - 3.6 K/uL Final  . Monocytes Relative 01/20/2018 11  % Final  . Monocytes Absolute 01/20/2018 0.4  0.2 - 1.0 K/uL Final  .  Eosinophils Relative 01/20/2018 6  % Final  . Eosinophils Absolute 01/20/2018 0.2  0 - 0.7 K/uL Final  . Basophils Relative 01/20/2018 1  % Final  . Basophils Absolute 01/20/2018 0.0  0 - 0.1 K/uL Final   Performed at [facility], [address], [place], [place] [address]    Assessment:  Bryan [name] is a 77 y.o. male with rheumatoid arthritis and progressive anemia over the past 2 years.  He has been on Plaquenil and hydralazine which can cause anemia.  Plaquenil was discontinued on 08/26/2016.  He denies any exposure to radiation or toxins.  He denies any prior history of hepatitis, prior transfusions or HIV risk factors.  He denies any herbal products.  Diet is good.  EGD on 09/24/2016 revealed patchy candidiasis in the entire esophagus.  There were two non-bleeding angioectasias in the duodenum treated with argon plasma coagulation.  Gastritis was biopsied.  Pathology revealed mild chronic gastritis negative for H pylori, dysplasia and malignancy.  Colonoscopy on 09/24/2016 revealed diverticulosis in the entire colon, one 3 mm polyp in the cecum, and non-bleeding internal hemorrhoids.  Pathology from the cecal polyp revealed a tubular adenoma negative for high grade dysplasia and malignancy.    He denies any melena or hematochezia. He denies any hematuria.  CBC on 06/02/2016 revealed a hematocrit of 29.5, hemoglobin 10.0, MCV 92.3, platelets 155,000, and WBC 4200.  Stool was guaiac negative x 2 in 06/2016.  Labs on 06/04/2016 revealed a ferritin of 124, iron saturation 22% and TIBC of 370.  B12 was 883 on 06/18/2014.  Work-up on 07/19/2016 revealed a hematocrit of 31.7, hemoglobin 10.6, MCV 91, platelets 143,000, white count 3000 with an ANC of 1800.  Absolute lymphocyte count was 700 (low). Creatinine was 1.57.  LDH was 269 (98-192).  Normal labs included:  uric acid, folate, Coombs, SPEP, and copper.  Iron studies included a saturation of 11% (low) and a TIBC of 454 (high) c/w  iron deficiency anemia.  Sed rate was 19.  Retic was 0.9% (low).  He is on oral iron with vitamin C.  Chest, abdomen, and pelvic CTon 08/09/2016 revealed no acute findings  or clear evidence of malignancy in the chest, abdomen or pelvis. There was asymmetric left rectal wall thickeningpossibly secondary to volume averaging with the prostate gland.  Bone marrow aspirate and biopsy on 11/04/2016 revealed a normocellular marrow (20%) with trilineage hematopoiesis and maturation.  There was mild megakaryocytic atypia.  Storage iron was present.  There were rare ringed sideroblasts.  Cytogenetics and FISH were normal (46, XY).  He was admitted to [facility] on 10/14/2016 - 10/18/2016 for spinal stenosis of the lumbar region.  He underwent redo decompressive lumbar laminotomy L5-S1 with radical foraminotomy the L5 and S1 nerve root with complete medial facetectomies. He underwent back surgery (revision fusion removal hardware and pedicle screw fixation) on 10/20/2017.  He was admitted to [facility] from 10/28/2017 - 10/31/2017 with symptomatic anemia.  He received 2 units of PRBCs.  Hemoglobin improved from 6.2 to 8.1.  Hospital work-up revealed the following normal labs: ferritin (65), B12 (401), TSH, SPEP, haptoglobin.  Testosterone was < 3 (low).  Iron saturation was 7% with a TIBC of 317.  LDH was 205 (98-192).  Retic was 2.6%.  Creatinine was 1.37 (CrCl 47.3 ml/min).  Peripheral smear revealed leukopenia with normal WBC morphology. Normocytic anemia with polychromasia, anisocytosis and rouleaux formation.   Work-up on 11/08/2017 revealed a hematocrit of 27.2, hemoglobin 9.0, platelets 200,000, WBC 5800 with an [place] of 4100.  Ferritin was 86.  Iron saturation was 6% with a TIBC of 382.  Sed rate was 56 (0-20).  Cold agglutinins were negative.  Reticulocyte count was 1.2%.  SPEP revealed no monoclonal protein on 10/31/2017.  He received weekly Venofer x 2 (11/25/2017 - 12/02/2017) and x 2 (12/23/2017 -  12/30/2017).  Ferritin has been followed: 106 on 06/24/2017, 65 on 10/30/2017, 86 on 11/08/2017, 124 on 12/12/2017, 106 on 12/15/2017, and 95 on 01/20/2018.  Iron saturation was 6% on 06/24/2017 and 6% on 01/20/2018.  Sed rate was 56 on 11/08/2017 and 64 on 12/05/2017.  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  He has treated with Septra then switched to doxycycline.  He underwent back surgery on 10/20/2017.  Symptomatically, he remains tired.  Exam is stable.  Platelet count is 117,000.  Plan: 1.  Review labs from 01/20/2018.  Discuss thrombocytopenia. 2.  Iron deficiency anemia:  Hemoglobin 9.9.  Ferritin is 95.  Sed rate 34.  s/p Venofer x 2 trial secondary to elevated sed rate.  No additional Venofer. 3.  Low zinc level (mild):    Significance is unclear.  Usual oral intake of zinc is approximately 4 to 14 mg/day.    Dietary sources of zinc:  meat, seafood, and milk. Cereals contains the greatest amount of zinc.  Low levels can be found in Crohn's disease, liver disease, and renal disease.    Suggest MVI with zinc. 4.  Thrombocytopenia:  Discuss new thrombocytopenia.  No new medications or herbal products.  Check counts in 6 weeks.  If etiology remains unclear, may need to repeat bone marrow. 5.  RTC in 6 weeks for labs (CBC with diff, ferritin, iron studies, zinc). 6.  RTC in 3 months for MD assessment, labs (CBC with diff, ferritin, iron studies, sed rat- day before), and +/- Venofer.   Lequita Asal, MD  01/27/2018, 2:36 PM

## 2018-01-27 NOTE — Progress Notes (Signed)
Patient offers no complaints today. 

## 2018-02-03 ENCOUNTER — Other Ambulatory Visit: Payer: Self-pay | Admitting: Hematology and Oncology

## 2018-02-03 ENCOUNTER — Inpatient Hospital Stay: Payer: Medicare Other

## 2018-02-10 ENCOUNTER — Inpatient Hospital Stay: Payer: Medicare Other

## 2018-02-10 VITALS — BP 159/81 | HR 65 | Temp 97.6°F | Resp 18

## 2018-02-10 DIAGNOSIS — D509 Iron deficiency anemia, unspecified: Secondary | ICD-10-CM

## 2018-02-10 MED ORDER — SODIUM CHLORIDE 0.9 % IV SOLN: 200.0000 mg | Freq: Once | INTRAVENOUS | Status: DC

## 2018-02-10 MED ORDER — IRON SUCROSE 20 MG/ML IV SOLN
200.0000 mg | Freq: Once | INTRAVENOUS | Status: AC
  Administered 2018-02-10: 200 mg via INTRAVENOUS
  Filled 2018-02-10: qty 10

## 2018-02-10 MED ORDER — SODIUM CHLORIDE 0.9 % IV SOLN
INTRAVENOUS | Status: DC
  Administered 2018-02-10: 14:00:00 via INTRAVENOUS
  Filled 2018-02-10: qty 250

## 2018-02-15 DIAGNOSIS — M5440 Lumbago with sciatica, unspecified side: Secondary | ICD-10-CM | POA: Diagnosis not present

## 2018-02-15 DIAGNOSIS — I10 Essential (primary) hypertension: Secondary | ICD-10-CM | POA: Diagnosis not present

## 2018-03-01 DIAGNOSIS — M545 Low back pain: Secondary | ICD-10-CM | POA: Diagnosis not present

## 2018-03-03 DIAGNOSIS — M545 Low back pain: Secondary | ICD-10-CM | POA: Diagnosis not present

## 2018-03-08 DIAGNOSIS — M545 Low back pain: Secondary | ICD-10-CM | POA: Diagnosis not present

## 2018-03-10 ENCOUNTER — Inpatient Hospital Stay: Payer: Medicare Other | Attending: Hematology and Oncology

## 2018-03-10 DIAGNOSIS — M545 Low back pain: Secondary | ICD-10-CM | POA: Diagnosis not present

## 2018-03-10 DIAGNOSIS — D509 Iron deficiency anemia, unspecified: Secondary | ICD-10-CM | POA: Diagnosis not present

## 2018-03-10 DIAGNOSIS — E60 Dietary zinc deficiency: Secondary | ICD-10-CM | POA: Insufficient documentation

## 2018-03-10 DIAGNOSIS — D696 Thrombocytopenia, unspecified: Secondary | ICD-10-CM | POA: Diagnosis not present

## 2018-03-10 LAB — CBC WITH DIFFERENTIAL/PLATELET
Abs Immature Granulocytes: 0.01 10*3/uL (ref 0.00–0.07)
Basophils Absolute: 0 10*3/uL (ref 0.0–0.1)
Basophils Relative: 1 %
Eosinophils Absolute: 0.1 10*3/uL (ref 0.0–0.5)
Eosinophils Relative: 3 %
HCT: 35 % — ABNORMAL LOW (ref 39.0–52.0)
Hemoglobin: 11.3 g/dL — ABNORMAL LOW (ref 13.0–17.0)
Immature Granulocytes: 0 %
Lymphocytes Relative: 20 %
Lymphs Abs: 0.8 10*3/uL (ref 0.7–4.0)
MCH: 27.8 pg (ref 26.0–34.0)
MCHC: 32.3 g/dL (ref 30.0–36.0)
MCV: 86.2 fL (ref 80.0–100.0)
Monocytes Absolute: 0.4 10*3/uL (ref 0.1–1.0)
Monocytes Relative: 11 %
Neutro Abs: 2.7 10*3/uL (ref 1.7–7.7)
Neutrophils Relative %: 65 %
Platelets: 110 10*3/uL — ABNORMAL LOW (ref 150–400)
RBC: 4.06 MIL/uL — ABNORMAL LOW (ref 4.22–5.81)
RDW: 16.6 % — ABNORMAL HIGH (ref 11.5–15.5)
WBC: 4.1 10*3/uL (ref 4.0–10.5)
nRBC: 0 % (ref 0.0–0.2)

## 2018-03-10 LAB — IRON AND TIBC
Iron: 51 ug/dL (ref 45–182)
Saturation Ratios: 15 % — ABNORMAL LOW (ref 17.9–39.5)
TIBC: 340 ug/dL (ref 250–450)
UIBC: 289 ug/dL

## 2018-03-10 LAB — FERRITIN: Ferritin: 133 ng/mL (ref 24–336)

## 2018-03-11 ENCOUNTER — Other Ambulatory Visit: Payer: Self-pay | Admitting: Cardiovascular Disease

## 2018-03-11 LAB — ZINC: Zinc: 63 ug/dL (ref 56–134)

## 2018-03-13 ENCOUNTER — Other Ambulatory Visit: Payer: Self-pay | Admitting: Cardiovascular Disease

## 2018-03-14 DIAGNOSIS — N183 Chronic kidney disease, stage 3 (moderate): Secondary | ICD-10-CM | POA: Diagnosis not present

## 2018-03-14 DIAGNOSIS — M545 Low back pain: Secondary | ICD-10-CM | POA: Diagnosis not present

## 2018-03-14 DIAGNOSIS — D693 Immune thrombocytopenic purpura: Secondary | ICD-10-CM | POA: Diagnosis not present

## 2018-03-14 DIAGNOSIS — M810 Age-related osteoporosis without current pathological fracture: Secondary | ICD-10-CM | POA: Diagnosis not present

## 2018-03-14 DIAGNOSIS — D709 Neutropenia, unspecified: Secondary | ICD-10-CM | POA: Diagnosis not present

## 2018-03-14 DIAGNOSIS — M0579 Rheumatoid arthritis with rheumatoid factor of multiple sites without organ or systems involvement: Secondary | ICD-10-CM | POA: Diagnosis not present

## 2018-03-18 DIAGNOSIS — Z23 Encounter for immunization: Secondary | ICD-10-CM | POA: Diagnosis not present

## 2018-03-20 ENCOUNTER — Other Ambulatory Visit: Payer: Self-pay | Admitting: Family Medicine

## 2018-03-21 ENCOUNTER — Telehealth: Payer: Self-pay | Admitting: Endocrinology

## 2018-03-21 ENCOUNTER — Telehealth: Payer: Self-pay

## 2018-03-21 DIAGNOSIS — M8000XS Age-related osteoporosis with current pathological fracture, unspecified site, sequela: Secondary | ICD-10-CM

## 2018-03-21 NOTE — Telephone Encounter (Signed)
Per [facility] (patient) saw the doctor on 01-12-18. He was supposed to get his bone scan results and call the patient back, but he hasn't heard anything. He was supposed to get a referral to see the spine doctor. Declined triage.

## 2018-03-21 NOTE — Telephone Encounter (Signed)
Pt scheduled for DEXA. Letter mailed with appointment date/time/location/contact #.

## 2018-03-21 NOTE — Telephone Encounter (Signed)
I am not seeing an order for the DEXA. After review of your notes, it appears you wanted him to have a DEXA scan completed. Although, I am not seeing any reference to a referral to a "spinal doctor". Please advise if I am permitted to enter the order for the DEXA in Epic so I can get him scheduled. Please advise about the referral as well.

## 2018-03-21 NOTE — Telephone Encounter (Signed)
Ok, since we did not receive a result, I have ordered, thank you.

## 2018-03-21 NOTE — Telephone Encounter (Signed)
Pt has been scheduled for DEXA on 04/13/18 @ 1:40pm, [facility] [place], [place]. Letter mailed.

## 2018-03-24 DIAGNOSIS — M545 Low back pain: Secondary | ICD-10-CM | POA: Diagnosis not present

## 2018-03-27 ENCOUNTER — Telehealth: Payer: Self-pay | Admitting: Family Medicine

## 2018-03-27 NOTE — Telephone Encounter (Signed)
Called and spoke with patient. Pt has been scheduled for an appt with Philis Nettle on 03/29/2018.

## 2018-03-27 NOTE — Telephone Encounter (Signed)
Copied from [place] [id number]. Topic: Quick Communication - See Telephone Encounter >> Mar 27, 2018  9:31 AM Rutherford Nail, NT wrote: CRM for notification. See Telephone encounter for: 03/27/18. Patient calling and would like a return call from Dr Caryl Bis regarding his abdominal hernia. Did not go into further details. CB#:[phone]

## 2018-03-27 NOTE — Telephone Encounter (Signed)
Called and spoke with patient. Pt stated that he believes that the hernia is related to when he had his back surgery. Pt stated that after he had his back surgery he had to strain often when he would try and get up this could have cause the hernia. Pt is c/o lower right side groin pain. Pain didn't bother him that much at first but now it is getting worse and this started 6 weeks ago. Pt wanted to know if he needs a referral or Not.

## 2018-03-27 NOTE — Telephone Encounter (Signed)
Sent to PCP ?

## 2018-03-27 NOTE — Telephone Encounter (Signed)
Please call the patient and get more details.

## 2018-03-27 NOTE — Telephone Encounter (Signed)
He will need to be evaluated in the office to determine if he has a hernia. Please offer him an appointment with me or Lauren. If he is having severe pain, nausea, vomiting, lack of bowel movements, or lack of passing gas he should be evaluated in the ED or at an urgent care as that could indicate that the hernia contains bowel that has been pinched and had the blood supply cut off. Thanks.

## 2018-03-28 DIAGNOSIS — M25511 Pain in right shoulder: Secondary | ICD-10-CM | POA: Diagnosis not present

## 2018-03-28 DIAGNOSIS — M545 Low back pain: Secondary | ICD-10-CM | POA: Diagnosis not present

## 2018-03-29 ENCOUNTER — Ambulatory Visit (INDEPENDENT_AMBULATORY_CARE_PROVIDER_SITE_OTHER): Payer: Medicare Other | Admitting: Family Medicine

## 2018-03-29 ENCOUNTER — Other Ambulatory Visit: Payer: Self-pay | Admitting: Urology

## 2018-03-29 ENCOUNTER — Encounter: Payer: Self-pay | Admitting: Family Medicine

## 2018-03-29 VITALS — BP 158/90 | HR 69 | Temp 97.7°F | Ht 71.0 in | Wt 171.2 lb

## 2018-03-29 DIAGNOSIS — K4090 Unilateral inguinal hernia, without obstruction or gangrene, not specified as recurrent: Secondary | ICD-10-CM | POA: Diagnosis not present

## 2018-03-29 NOTE — Progress Notes (Signed)
Subjective:    Patient ID: Bryan [name], male    DOB: [birth year], 77 y.o.   MRN: [id number]  HPI   Presents to clinic due to a possible hernia in right groin.  Patient states after his most recent back surgery, did notice self straining more and believes this is how the hernia occurred.  Patient states the area is not painful, and he usually is able to push hernia back in but as soon as he stands up it will pop right back out.  Denies any issues having bowel movements or any issues with urination.  No nausea, vomiting or diarrhea.  Patient Active Problem List   Diagnosis Date Noted  . Zinc deficiency 01/27/2018  . Osteoporosis 01/12/2018  . Abnormal CT scan of lung 12/06/2017  . Low back pain 12/05/2017  . Other fatigue   . Anemia   . AKI (acute kidney injury) ([place])   . Fracture lumbar vertebra-closed ([place]) 10/20/2017  . Pseudoarthrosis of lumbar spine 09/28/2017  . Anxiety 06/24/2017  . Lumbar burst fracture, closed, initial encounter ([place]) 06/08/2017  . Conjunctivitis, bacterial 03/22/2017  . Urge incontinence 12/15/2016  . Ataxia 11/09/2016  . Insomnia 11/09/2016  . Transient memory loss 11/09/2016  . Nausea and vomiting 10/15/2016  . Spinal stenosis of lumbar region 10/14/2016  . Leukopenia 09/27/2016  . Thrombocytopenia ([place]) 09/27/2016  . Other pancytopenia ([place]) 09/27/2016  . History of left knee replacement 09/01/2016  . Fall 09/01/2016  . HNP (herniated nucleus pulposus with myelopathy), thoracic 03/08/2016  . Rectal nodule 02/23/2016  . DDD (degenerative disc disease), lumbar 01/02/2016  . Lumbar radiculopathy 01/02/2016  . Facet syndrome, lumbar 01/02/2016  . Greater trochanteric bursitis 11/28/2015  . Acute renal failure ([place]) 11/06/2015  . Dehydration 10/17/2015  . IBS (irritable bowel syndrome) 07/15/2015  . Erectile dysfunction of organic origin 07/15/2015  . Carotid stenosis 05/06/2015  . Difficulty sleeping 04/15/2015  . Hypogonadism in male  03/21/2015  . Chronic kidney disease 02/17/2015  . 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  . Enlarged prostate with lower urinary tract symptoms (LUTS) 02/17/2015  . Rheumatoid arthritis ([place]) 10/01/2014  . Sacroiliac joint disease 10/01/2014  . Fusion of spine, sacral and sacrococcygeal region 10/01/2014  . DDD (degenerative disc disease), cervical 10/01/2014  . Lumbar and sacral osteoarthritis 10/01/2014  . Mechanical and motor problems with internal organs 06/20/2014  . Iron deficiency anemia 06/19/2014  . Symptomatic anemia 06/19/2014  . Aneurysm, ascending aorta ([place]) 05/27/2014  . Aneurysm of thoracic aorta ([place]) 05/27/2014  . Thoracic aortic aneurysm without rupture ([place]) 05/27/2014  . Cognitive decline 04/01/2014  . Spinal stenosis of lumbar region with radiculopathy 01/30/2014  . Vitamin D deficiency 01/30/2014  . Dorsalgia, unspecified 01/30/2014  . Lumbar canal stenosis 01/30/2014  . Paroxysmal atrial fibrillation ([place]) 11/27/2013  . S/P AVR (aortic valve replacement) 11/27/2013  . History of ascending aorta repair 11/27/2013  . Hyperlipidemia 11/27/2013  . Essential hypertension 11/27/2013  . Bicuspid aortic valve 11/27/2013  . Atherosclerotic heart disease of native coronary artery without angina pectoris 11/27/2013  . 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 History of open heart surgery 11/27/2013  . Carotid artery insufficiency syndrome 11/06/2010   Social History   Tobacco Use  . Smoking status: Former Smoker    Packs/day: 1.00    Years: 32.00    Pack years: 32.00    Types: Cigarettes    Last attempt to quit: 07/05/1981    Years since quitting:  36.7  . Smokeless tobacco: Never Used  Substance Use Topics  . Alcohol use: Yes    Comment: 10/19/2017 "glass of wine/month; if that"   Review of Systems  Constitutional: Negative for chills, fatigue and fever.  HENT: Negative for congestion, ear pain, sinus  pain and sore throat.   Eyes: Negative.   Respiratory: Negative for cough, shortness of breath and wheezing.   Cardiovascular: Negative for chest pain, palpitations and leg swelling.  Gastrointestinal: Negative for abdominal pain, diarrhea, nausea and vomiting.  Genitourinary: Negative for dysuria, frequency and urgency. +possible hernia right groin Musculoskeletal: Negative for arthralgias and myalgias.  Skin: Negative for color change, pallor and rash.  Neurological: Negative for syncope, light-headedness and headaches.  Psychiatric/Behavioral: The patient is not nervous/anxious.    Objective:   Physical Exam  Constitutional: He is oriented to person, place, and time. He appears well-nourished. No distress.  HENT:  Head: Normocephalic and atraumatic.  Eyes: Conjunctivae and EOM are normal. No scleral icterus.  Neck: Neck supple. No tracheal deviation present.  Cardiovascular: Normal rate and regular rhythm.  Pulmonary/Chest: Effort normal and breath sounds normal. No respiratory distress.  Abdominal: Soft. Bowel sounds are normal. He exhibits no distension. There is no tenderness. There is no guarding. A hernia is present. Hernia confirmed positive in the right inguinal area.  Genitourinary:     Genitourinary Comments: Right inguinal hernia that is reducible when patient lying down.  When patient stands back up hernia pops back out  Musculoskeletal: He exhibits no edema.  Walks with cane  Neurological: He is alert and oriented to person, place, and time.  Skin: Skin is warm and dry.  Psychiatric: He has a normal mood and affect. His behavior is normal.  Nursing note and vitals reviewed.     Vitals:   03/29/18 1112  BP: (!) 158/90  Pulse: 69  Temp: 97.7 F (36.5 C)  SpO2: 94%   Assessment & Plan:   Right inguinal hernia - we will get CT scan of abdomen pelvis to further evaluate hernia and see if any bowel trapped in hernia outpouching.  We will also do general surgery  referral for evaluation and further management of the hernia.  Patient is agreeable with this plan.  Keep regularly scheduled follow-up with PCP as planned.  Return to clinic sooner if any issues arise.  Patient aware he will be contacted regarding his general surgery referral and also his CT scan appointment.

## 2018-03-31 ENCOUNTER — Other Ambulatory Visit: Payer: Self-pay

## 2018-03-31 MED ORDER — FLUOXETINE HCL 20 MG PO CAPS: ORAL_CAPSULE | ORAL | 1 refills | Status: DC

## 2018-03-31 MED ORDER — AMLODIPINE BESYLATE 5 MG PO TABS: 5.0000 mg | ORAL_TABLET | Freq: Every day | ORAL | 1 refills | Status: DC

## 2018-04-05 ENCOUNTER — Ambulatory Visit: Payer: Medicare Other

## 2018-04-07 NOTE — Telephone Encounter (Signed)
See previous note

## 2018-04-11 ENCOUNTER — Ambulatory Visit: Payer: Self-pay | Admitting: Urology

## 2018-04-11 ENCOUNTER — Other Ambulatory Visit: Payer: Self-pay

## 2018-04-12 ENCOUNTER — Other Ambulatory Visit: Payer: Self-pay

## 2018-04-12 ENCOUNTER — Ambulatory Visit (INDEPENDENT_AMBULATORY_CARE_PROVIDER_SITE_OTHER): Payer: Medicare Other | Admitting: Urology

## 2018-04-12 ENCOUNTER — Encounter: Payer: Self-pay | Admitting: Urology

## 2018-04-12 VITALS — BP 160/65 | HR 86 | Ht 72.0 in | Wt 163.3 lb

## 2018-04-12 DIAGNOSIS — N3941 Urge incontinence: Secondary | ICD-10-CM | POA: Diagnosis not present

## 2018-04-12 DIAGNOSIS — R399 Unspecified symptoms and signs involving the genitourinary system: Secondary | ICD-10-CM | POA: Diagnosis not present

## 2018-04-12 DIAGNOSIS — E291 Testicular hypofunction: Secondary | ICD-10-CM

## 2018-04-12 DIAGNOSIS — I6523 Occlusion and stenosis of bilateral carotid arteries: Secondary | ICD-10-CM | POA: Diagnosis not present

## 2018-04-12 DIAGNOSIS — N138 Other obstructive and reflux uropathy: Secondary | ICD-10-CM | POA: Diagnosis not present

## 2018-04-12 DIAGNOSIS — N401 Enlarged prostate with lower urinary tract symptoms: Secondary | ICD-10-CM | POA: Diagnosis not present

## 2018-04-12 LAB — BLADDER SCAN AMB NON-IMAGING: Scan Result: 36

## 2018-04-12 MED ORDER — MIRABEGRON ER 50 MG PO TB24: 50.0000 mg | ORAL_TABLET | Freq: Every day | ORAL | 3 refills | Status: DC

## 2018-04-12 NOTE — Progress Notes (Signed)
9:59 AM   Bryan [name] August 21, 1940 [id number]  Referring provider: [name], MD [address] [place], [place] [address]  Chief Complaint  Patient presents with  . Benign Prostatic Hypertrophy    HPI: Patient is a 77 year old  Caucasian  male with testosterone deficiency, erectile dysfunction and BPH with LUTS who presents today for follow up.    BPH WITH LUTS His IPSS score today is 8, which is moderate lower urinary tract symptomatology. He is with his quality life due to his urinary symptoms.  His PVR is 36 mL.  His previous IPSS score was 11/3.  His previous PVR is 56 mL.  His main complaints today are frequency, nocturia and incontinence.  He is currently taking Myrbetriq 50 mg daily.  He denies any dysuria, hematuria or suprapubic pain.  He has had laser bladder outlet procedure in 05/05/2013.  He also denies any recent fevers, chills, nausea or vomiting.   He does not have a family history of PCa. IPSS    Row Name 04/12/18 0900         International Prostate Symptom Score   How often have you had the sensation of not emptying your bladder?  Less than 1 in 5     How often have you had to urinate less than every two hours?  Less than 1 in 5 times     How often have you found you stopped and started again several times when you urinated?  Not at All     How often have you found it difficult to postpone urination?  Less than half the time     How often have you had a weak urinary stream?  Less than 1 in 5 times     How often have you had to strain to start urination?  Less than 1 in 5 times     How many times did you typically get up at night to urinate?  2 Times     Total IPSS Score  8       Quality of Life due to urinary symptoms   If you were to spend the rest of your life with your urinary condition just the way it is now how would you feel about that?  Mostly Satisfied        Score:  1-7 Mild 8-19 Moderate 20-35 Severe  Testosterone  deficiency Patient is advised to continue his testosterone therapy as he is anemic and has osteoporosis.  He is currently having 1 cc of 200mg /cc of testosterone cypionate injected by [facility] every 14 days.     PMH: Past Medical History:  Diagnosis Date  . Abnormal CT scan    Asymmetric left rectal wall thickening   . Anemia   . Anxiety   . Arrhythmia   . Chronic lower back pain   . Complication of anesthesia    Memory loss 09/2015  . Coronary artery disease   . Depression   . GERD (gastroesophageal reflux disease)   . Glaucoma   . H/O thoracic aortic aneurysm repair   . Heart murmur   . History of being hospitalized    memory lose kidney funtion down blood pressure up  . History of blood transfusion    "think he had one when he had heart valve OR" (10/14/2016); "none since" (10/19/2017)  . History of chicken pox   . Hyperlipidemia   . Hypertension   . Lumbar stenosis   .  Neuropathy   . Osteoarthritis    worse in feet and ankles  . Paroxysmal A-fib ([place])   . Poor short term memory    takes Aricept  . Rheumatoid arthritis ([place])    "all over" (10/14/2016)  . Schizophrenia ([place])   . Seasonal allergies   . Valvular heart disease     Surgical History: Past Surgical History:  Procedure Laterality Date  . AORTIC VALVE REPLACEMENT  2007   [facility].  [place], [place]; "pig valve"  . APPENDECTOMY  2010  . BACK SURGERY    . CARDIAC VALVE REPLACEMENT    . CATARACT EXTRACTION W/ INTRAOCULAR LENS  IMPLANT, BILATERAL Bilateral 2012  . COLONOSCOPY WITH PROPOFOL N/A 09/24/2016   Procedure: COLONOSCOPY WITH PROPOFOL;  Surgeon: Jonathon Bellows, MD;  Location[facility] END[facility]COPY;  Service: Endoscopy;  Laterality: N/A;  . ESOPHAGOGASTRODUODENOSCOPY (EGD) WITH PROPOFOL N/A 09/24/2016   Procedure: ESOPHAGOGASTRODUODENOSCOPY (EGD) WITH PROPOFOL;  Surgeon: Jonathon Bellows, MD;  Location: [facility] ENDOSCOPY;  Service: Endoscopy;  Laterality: N/A;  . GIVENS CAPSULE STUDY N/A 09/24/2016   Procedure:  GIVENS CAPSULE STUDY;  Surgeon: Jonathon Bellows, MD;  Location: [facility] ENDOSCOPY;  Service: Endoscopy;  Laterality: N/A;  . HAMMER TOE SURGERY Right 10/09/2015   Procedure: HAMMER TOE REPAIR WITH K-WIRE FIXATION RIGHT SECOND TOE;  Surgeon: Albertine Patricia, DPM;  Location: [place];  Service: Podiatry;  Laterality: Right;  WITH LOCAL  . JOINT REPLACEMENT    . LAMINECTOMY WITH POSTERIOR LATERAL ARTHRODESIS LEVEL 3 N/A 09/28/2017   Procedure: LUMBAR  POSTEROR  FUSION REVISION - LUMBAR ONE -TWO, LUMBAR TWO -THREE,THREE-FOUR ,BILATERAL ARTHRODESIS REMOVAL LUMBAR THREE HARDWARE;  Surgeon: Kary Kos, MD;  Location: [place];  Service: Neurosurgery;  Laterality: N/A;  . LAMINECTOMY WITH POSTERIOR LATERAL ARTHRODESIS LEVEL 3 N/A 10/20/2017   Procedure: Revision fusion and removal of hardware Lumbar one, Pedicle screw fixation thoracic ten-lumbar two, thoracic nine-ten laminotomy, Posterior Lumbar Arthrodesis thoracic ten-lumbar two ;  Surgeon: Kary Kos, MD;  Location: [place];  Service: Neurosurgery;  Laterality: N/A;  . LAPAROSCOPIC CHOLECYSTECTOMY  2010  . LUMBAR FUSION Right 06/08/2017   LUMBAR THREE-FOUR, LUMBAR FOUR-FIVE POSTEROLATERAL ARTHRODESIS WITH RIGHT LUMBAR FOUR-FIVE LAMINECTOMY/FORAMINOTOMY  . LUMBAR LAMINECTOMY/DECOMPRESSION MICRODISCECTOMY Right 03/08/2016   Procedure: Laminectomy and Foraminotomy - Lumbar Five-Sacral One Right;  Surgeon: Kary Kos, MD;  Location: [place];  Service: Neurosurgery;  Laterality: Right;  Right  . MULTIPLE TOOTH EXTRACTIONS     "3"  . POSTERIOR LUMBAR FUSION  10/14/2016   L5-S1  . REPLACEMENT TOTAL KNEE Left 2003  . THORACIC AORTIC ANEURYSM REPAIR  2010    Home Medications:  Allergies as of 04/12/2018      Reactions   Penicillins Hives, Swelling, Other (See Comments)   SWELLING REACTION UNSPECIFIED PATIENT HAS TAKEN AMOXICILLIN ON MED HX FROM [facility] PATIENT HAS HAD A PCN REACTION WITH IMMEDIATE RASH, FACIAL/TONGUE/THROAT SWELLING, SOB, OR LIGHTHEADEDNESS WITH  HYPOTENSION:  #  #  #  YES  #  #  #   Has patient had a PCN reaction causing severe rash involving mucus membranes or skin necrosis: No Has patient had a PCN reaction that required hospitalization No Has patient had a PCN reaction occurring within the last 10 years: No   Demerol [meperidine] Hives, Nausea And Vomiting      Medication List        Accurate as of 04/12/18  9:59 AM. Always use your most recent med list.          acetaminophen 325  MG tablet Commonly known as:  TYLENOL Take 650 mg by mouth 4 (four) times daily.   amLODipine 5 MG tablet Commonly known as:  NORVASC Take 1 tablet (5 mg total) by mouth daily.   aspirin 325 MG EC tablet Take 325 mg by mouth every evening.   atorvastatin 40 MG tablet Commonly known as:  LIPITOR Take 1 tablet (40 mg total) by mouth daily.   cetirizine 10 MG tablet Commonly known as:  ZYRTEC Take 10 mg by mouth daily.   cyclobenzaprine 10 MG tablet Commonly known as:  FLEXERIL Take 1 tablet (10 mg total) by mouth 2 (two) times daily as needed for muscle spasms.   donepezil 5 MG tablet Commonly known as:  ARICEPT Take 5 mg by mouth at bedtime.   fenofibrate 160 MG tablet TAKE ONE TABLET BY MOUTH EVERY DAY   FLUoxetine 20 MG capsule Commonly known as:  PROZAC TAKE 1 CAPSULE (20MG ) BY MOUTH EVERY DAY   gabapentin 400 MG capsule Commonly known as:  NEURONTIN Take 400 mg by mouth 3 (three) times daily.   GAVILAX powder Generic drug:  polyethylene glycol powder USE 1 CAPFUL IN 4-8 OUNCES OF LIQUID DAILY AS DIRECTED   HYDROcodone-acetaminophen 10-325 MG tablet Commonly known as:  NORCO Take 1 tablet by mouth every 6 (six) hours as needed for moderate pain.   hydrocortisone cream 1 % Apply 1 application topically daily as needed for itching.   lisinopril 20 MG tablet Commonly known as:  PRINIVIL,ZESTRIL TAKE ONE TABLET BY MOUTH EVERY EVENING   mirabegron ER 50 MG Tb24 tablet Commonly known as:  MYRBETRIQ Take 1 tablet  (50 mg total) by mouth daily.   omeprazole 40 MG capsule Commonly known as:  PRILOSEC TAKE 1 CAPSULE BY MOUTH DAILY   tamsulosin 0.4 MG Caps capsule Commonly known as:  FLOMAX TAKE 1 CAPSULE BY MOUTH DAILY   testosterone cypionate 200 MG/ML injection Commonly known as:  DEPOTESTOSTERONE CYPIONATE INJECT 52mL INTO THE MUSCLE EVERY 14 DAYS   traZODone 50 MG tablet Commonly known as:  DESYREL Take 100 mg by mouth at bedtime.       Allergies:  Allergies  Allergen Reactions  . Penicillins Hives, Swelling and Other (See Comments)    SWELLING REACTION UNSPECIFIED PATIENT HAS TAKEN AMOXICILLIN ON MED HX FROM [facility] PATIENT HAS HAD A PCN REACTION WITH IMMEDIATE RASH, FACIAL/TONGUE/THROAT SWELLING, SOB, OR LIGHTHEADEDNESS WITH HYPOTENSION:  #  #  #  YES  #  #  #   Has patient had a PCN reaction causing severe rash involving mucus membranes or skin necrosis: No Has patient had a PCN reaction that required hospitalization No Has patient had a PCN reaction occurring within the last 10 years: No  . Demerol [Meperidine] Hives and Nausea And Vomiting    Family History: Family History  Problem Relation Age of Onset  . Heart failure Mother   . Hypertension Mother   . Asthma Mother   . Osteoporosis Mother   . Heart attack Father 53       MI  . Hypertension Sister   . Prostate cancer Neg Hx   . Bladder Cancer Neg Hx   . Kidney cancer Neg Hx     Social History:  reports that he quit smoking about 36 years ago. His smoking use included cigarettes. He has a 32.00 pack-year smoking history. He has never used smokeless tobacco. He reports that he drinks alcohol. He reports that he does not use drugs.  ROS: UROLOGY  Frequent Urination?: Yes Hard to postpone urination?: No Burning/pain with urination?: No Get up at night to urinate?: Yes Leakage of urine?: No Urine stream starts and stops?: No Trouble starting stream?: Yes Do you have to strain to urinate?: No Blood in urine?:  No Urinary tract infection?: No Sexually transmitted disease?: No Injury to kidneys or bladder?: No Painful intercourse?: No Weak stream?: No Erection problems?: No Penile pain?: No  Gastrointestinal Nausea?: No Vomiting?: No Indigestion/heartburn?: No Diarrhea?: No Constipation?: Yes  Constitutional Fever: No Night sweats?: No Weight loss?: No Fatigue?: No  Skin Skin rash/lesions?: No Itching?: No  Eyes Blurred vision?: No Double vision?: No  Ears/Nose/Throat Sore throat?: No Sinus problems?: No  Hematologic/Lymphatic Swollen glands?: No Easy bruising?: No  Cardiovascular Leg swelling?: No Chest pain?: No  Respiratory Cough?: No Shortness of breath?: No  Endocrine Excessive thirst?: No  Musculoskeletal Back pain?: Yes Joint pain?: No  Neurological Headaches?: No Dizziness?: No  Psychologic Depression?: No Anxiety?: No  Physical Exam: BP (!) 160/65   Pulse 86   Ht 6' (1.829 m)   Wt 163 lb 4.8 oz (74.1 kg)   BMI 22.15 kg/m   Constitutional: Well nourished. Alert and oriented, No acute distress. HEENT: [place] AT, moist mucus membranes. Trachea midline, no masses. Cardiovascular: No clubbing, cyanosis, or edema. Respiratory: Normal respiratory effort, no increased work of breathing. GI: Abdomen is soft, non tender, non distended, no abdominal masses. Liver and spleen not palpable.  RIght hernia appreciated (has upcoming appointment with Dr. Bary Castilla)   Stool sample for occult testing is not indicated.   GU: No CVA tenderness.  No bladder fullness or masses.  Patient with uncircumcised phallus.   Foreskin easily retracted   Urethral meatus is patent.  No penile discharge. No penile lesions or rashes. Scrotum without lesions, cysts, rashes and/or edema.  Testicles are located scrotally bilaterally. No masses are appreciated in the testicles. Left and right epididymis are normal. Rectal: Patient with  normal sphincter tone. Anus and perineum without  scarring or rashes. No rectal masses are appreciated. Prostate is approximately 25 grams, no nodules are appreciated. Seminal vesicles are normal. Skin: No rashes, bruises or suspicious lesions. Lymph: No cervical or inguinal adenopathy. Neurologic: Grossly intact, no focal deficits, moving all 4 extremities. Psychiatric: Normal mood and affect.    Laboratory Data: Lab Results  Component Value Date   WBC 4.1 03/10/2018   HGB 11.3 (L) 03/10/2018   HCT 35.0 (L) 03/10/2018   MCV 86.2 03/10/2018   PLT 110 (L) 03/10/2018    Lab Results  Component Value Date   CREATININE 1.19 12/15/2017    PSA history:  <0.1 ng/mL on 01/17/2014    0.2 ng/mL on 08/07/2014    0.3 ng/mL on 02/17/2015    0.2 ng/mL on 07/09/2015  <0.2 ng/mL on 01/03/2017  Lab Results  Component Value Date   TESTOSTERONE <3 (L) 10/31/2017   Assessment & Plan:    1. LUTS IPSS score is 8/2, it is improving Continue conservative management, avoiding bladder irritants and timed voiding's most bothersome symptoms is/are urge incontinence Disontinue tamsulosin 0.4 mg  RTC in 6 months for I PSS and PVR   2. Urge incontinence  Continue Myrbetriq 50 mg daily; script sent to pharmacy RTC in 6 months for I PSS and PVR  3. Testosterone deficiency Will check testosterone level today with PSA - if appropriate will refill testosterone cypionate RTC in 6 months for testosterone level one week after injection and PSA  Return in  about 6 months (around 10/11/2018) for PSA, testosterone (one week after injection) , IPSS, PVR and exam.  Zara Council, [facility]  [address] [place], [place] [address] [phone]

## 2018-04-13 ENCOUNTER — Ambulatory Visit
Admission: RE | Admit: 2018-04-13 | Discharge: 2018-04-13 | Disposition: A | Discharge status: Home / Self Care | Payer: Medicare Other | Source: Ambulatory Visit | Attending: Endocrinology | Admitting: Endocrinology

## 2018-04-13 ENCOUNTER — Telehealth: Payer: Self-pay

## 2018-04-13 DIAGNOSIS — M810 Age-related osteoporosis without current pathological fracture: Secondary | ICD-10-CM | POA: Diagnosis not present

## 2018-04-13 DIAGNOSIS — M8000XS Age-related osteoporosis with current pathological fracture, unspecified site, sequela: Secondary | ICD-10-CM | POA: Insufficient documentation

## 2018-04-13 LAB — PSA: Prostate Specific Ag, Serum: 0.2 ng/mL (ref 0.0–4.0)

## 2018-04-13 LAB — TESTOSTERONE: Testosterone: 34 ng/dL — ABNORMAL LOW (ref 264–916)

## 2018-04-13 MED ORDER — TESTOSTERONE CYPIONATE 200 MG/ML IM SOLN: INTRAMUSCULAR | 0 refills | Status: DC

## 2018-04-13 NOTE — Telephone Encounter (Signed)
-----   Message from Nori Riis, PA-C sent at 04/13/2018  7:41 AM EST ----- Please let Bryan [name] know that his PSA is normal and his testosterone is very low.  We need to send in the refill for this testosterone cypionate 200mg /cc, 1 cc every 14 days to [facility].  I would like to have his testosterone rechecked one week after his fourth injection.

## 2018-04-13 NOTE — Telephone Encounter (Signed)
Pt informed, rx printed and faxed to [facility] pharmacy.

## 2018-04-14 ENCOUNTER — Ambulatory Visit
Admission: RE | Admit: 2018-04-14 | Discharge: 2018-04-14 | Disposition: A | Discharge status: Home / Self Care | Payer: Medicare Other | Source: Ambulatory Visit | Attending: Family Medicine | Admitting: Family Medicine

## 2018-04-14 ENCOUNTER — Telehealth: Payer: Self-pay | Admitting: Endocrinology

## 2018-04-14 DIAGNOSIS — K4090 Unilateral inguinal hernia, without obstruction or gangrene, not specified as recurrent: Secondary | ICD-10-CM | POA: Diagnosis not present

## 2018-04-14 DIAGNOSIS — K5730 Diverticulosis of large intestine without perforation or abscess without bleeding: Secondary | ICD-10-CM | POA: Diagnosis not present

## 2018-04-14 NOTE — Telephone Encounter (Signed)
Patient has returned Bryan [name] call.

## 2018-04-14 NOTE — Telephone Encounter (Signed)
Duplicate encounter. This has been addressed in a previously opened encounter

## 2018-04-17 ENCOUNTER — Telehealth: Payer: Self-pay | Admitting: Urology

## 2018-04-17 ENCOUNTER — Other Ambulatory Visit: Payer: Self-pay | Admitting: Urology

## 2018-04-17 NOTE — Telephone Encounter (Signed)
Rx has been sent to pharmacy.  Left message informing patient.

## 2018-04-17 NOTE — Telephone Encounter (Signed)
Pt called office several times this morning very upset, states he was told his Rx was faxed to [facility] on the 21st, [facility] states they have not received a fax from this office. Pt asks that this Rx gets re-faxed as soon as possible please.  Please advise. Thank you.

## 2018-04-18 ENCOUNTER — Ambulatory Visit (INDEPENDENT_AMBULATORY_CARE_PROVIDER_SITE_OTHER): Payer: Medicare Other | Admitting: General Surgery

## 2018-04-18 ENCOUNTER — Other Ambulatory Visit: Payer: Self-pay

## 2018-04-18 ENCOUNTER — Encounter: Payer: Self-pay | Admitting: General Surgery

## 2018-04-18 VITALS — BP 136/70 | HR 89 | Temp 97.3°F | Ht 72.0 in | Wt 167.2 lb

## 2018-04-18 DIAGNOSIS — I6523 Occlusion and stenosis of bilateral carotid arteries: Secondary | ICD-10-CM | POA: Diagnosis not present

## 2018-04-18 DIAGNOSIS — K4090 Unilateral inguinal hernia, without obstruction or gangrene, not specified as recurrent: Secondary | ICD-10-CM | POA: Insufficient documentation

## 2018-04-18 NOTE — Patient Instructions (Addendum)
Cardiac clearance prior to surgery, Cardiologist is Dr [name]   Inguinal Hernia, Adult An inguinal hernia is when fat or the intestines push through the area where the leg meets the lower belly (groin) and make a rounded lump (bulge). This condition happens over time. There are three types of inguinal hernias. These types include:  Hernias that can be pushed back into the belly (are reducible).  Hernias that cannot be pushed back into the belly (are incarcerated).  Hernias that cannot be pushed back into the belly and lose their blood supply (get strangulated). This type needs emergency surgery.  Follow these instructions at home: Lifestyle  Drink enough fluid to keep your urine (pee) clear or pale yellow.  Eat plenty of fruits, vegetables, and whole grains. These have a lot of fiber. Talk with your doctor if you have questions.  Avoid lifting heavy objects.  Avoid standing for long periods of time.  Do not use tobacco products. These include cigarettes, chewing tobacco, or e-cigarettes. If you need help quitting, ask your doctor.  Try to stay at a healthy weight. General instructions  Do not try to force the hernia back in.  Watch your hernia for any changes in color or size. Let your doctor know if there are any changes.  Take over-the-counter and prescription medicines only as told by your doctor.  Keep all follow-up visits as told by your doctor. This is important. Contact a doctor if:  You have a fever.  You have new symptoms.  Your symptoms get worse. Get help right away if:  The area where the legs meets the lower belly has: ? Pain that gets worse suddenly. ? A bulge that gets bigger suddenly and does not go down. ? A bulge that turns red or purple. ? A bulge that is painful to the touch.  You are a man and your scrotum: ? Suddenly feels painful. ? Suddenly changes in size.  You feel sick to your stomach (nauseous) and this feeling does not go away.  You  throw up (vomit) and this keeps happening.  You feel your heart beating a lot more quickly than normal.  You cannot poop (have a bowel movement) or pass gas. This information is not intended to replace advice given to you by your health care provider. Make sure you discuss any questions you have with your health care provider. Document Released: 06/10/2006 Document Revised: 10/16/2015 Document Reviewed: 03/20/2014 Elsevier Interactive Patient Education  [employer].   The patient is scheduled for surgery at [facility] with Dr Bary Castilla on 05/05/18. He will pre admit at the hospital and we will call him with that date and time. We will send over a request for Cardiac Clearance from Dr [name]. The patient is aware of date and instructions.

## 2018-04-18 NOTE — Progress Notes (Signed)
Patient ID: Bryan [name], male   DOB: 05-13-1941, 77 y.o.   MRN: [id number]  Chief Complaint  Patient presents with  . Hernia    HPI Bryan [name] is a 77 y.o. male.  Here for evaluation of a hernia referred by Bryan Nettle NP. CT was on 04-16-18. He states he has a right groin knot. He states he first noticed it back in August while he was recovering from back surgery and getting up from a low chair which caused a strain. Slight groin pain and he can push the knot back in but it doesn't stay. He states the discomfort is worse with getting up and down which has gradually gotten worse. Bowels move regular, uses miralax as needed since he take chronic pain medications. He is a retired Software engineer, [employer].  HPI  Past Medical History:  Diagnosis Date  . Abnormal CT scan    Asymmetric left rectal wall thickening   . Anemia   . Anxiety   . Arrhythmia   . Chronic lower back pain   . Complication of anesthesia    Memory loss 09/2015  . Coronary artery disease   . Depression   . GERD (gastroesophageal reflux disease)   . Glaucoma   . H/O thoracic aortic aneurysm repair   . Heart murmur   . History of being hospitalized    memory lose kidney funtion down blood pressure up  . History of blood transfusion    "think he had one when he had heart valve OR" (10/14/2016); "none since" (10/19/2017)  . History of chicken pox   . Hyperlipidemia   . Hypertension   . Lumbar stenosis   . Murmur   . Neuropathy   . Osteoarthritis    worse in feet and ankles  . Paroxysmal A-fib ([place])   . Poor short term memory    takes Aricept  . Rheumatoid arthritis ([place])    "all over" (10/14/2016)  . Schizophrenia ([place])   . Seasonal allergies   . Valvular heart disease     Past Surgical History:  Procedure Laterality Date  . AORTIC VALVE REPLACEMENT  2007   [facility].  [place], [place]; "pig valve"  . APPENDECTOMY  2010  . BACK SURGERY    . CARDIAC VALVE REPLACEMENT    . CATARACT  EXTRACTION W/ INTRAOCULAR LENS  IMPLANT, BILATERAL Bilateral 2012  . COLONOSCOPY WITH PROPOFOL N/A 09/24/2016   Procedure: COLONOSCOPY WITH PROPOFOL;  Surgeon: Jonathon Bellows, MD;  Location[facility] ENDOSCOPY;  Service: Endoscopy;  Laterality: N/A;  . ESOPHAGOGASTRODUODENOSCOPY (EGD) WITH PROPOFOL N/A 09/24/2016   Procedure: ESOPHAGOGASTRODUODENOSCOPY (EGD) WITH PROPOFOL;  Surgeon: Jonathon Bellows, MD;  Location: [facility] ENDOSCOPY;  Service: Endoscopy;  Laterality: N/A;  . GIVENS CAPSULE STUDY N/A 09/24/2016   Procedure: GIVENS CAPSULE STUDY;  Surgeon: Jonathon Bellows, MD;  Location[facility] ENDOSCOPY;  Service: Endoscopy;  Laterality: N/A;  . HAMMER TOE SURGERY Right 10/09/2015   Procedure: HAMMER TOE REPAIR WITH K-WIRE FIXATION RIGHT SECOND TOE;  Surgeon: Albertine Patricia, DPM;  Location: [place];  Service: Podiatry;  Laterality: Right;  WITH LOCAL  . JOINT REPLACEMENT    . LAMINECTOMY WITH POSTERIOR LATERAL ARTHRODESIS LEVEL 3 N/A 09/28/2017   Procedure: LUMBAR  POSTEROR  FUSION REVISION - LUMBAR ONE -TWO, LUMBAR TWO -THREE,THREE-FOUR ,BILATERAL ARTHRODESIS REMOVAL LUMBAR THREE HARDWARE;  Surgeon: Kary Kos, MD;  Location: [place];  Service: Neurosurgery;  Laterality: N/A;  . LAMINECTOMY WITH POSTERIOR LATERAL ARTHRODESIS LEVEL 3 N/A 10/20/2017   Procedure:  Revision fusion and removal of hardware Lumbar one, Pedicle screw fixation thoracic ten-lumbar two, thoracic nine-ten laminotomy, Posterior Lumbar Arthrodesis thoracic ten-lumbar two ;  Surgeon: Kary Kos, MD;  Location: [place];  Service: Neurosurgery;  Laterality: N/A;  . LAPAROSCOPIC CHOLECYSTECTOMY  2010  . LUMBAR FUSION Right 06/08/2017   LUMBAR THREE-FOUR, LUMBAR FOUR-FIVE POSTEROLATERAL ARTHRODESIS WITH RIGHT LUMBAR FOUR-FIVE LAMINECTOMY/FORAMINOTOMY  . LUMBAR LAMINECTOMY/DECOMPRESSION MICRODISCECTOMY Right 03/08/2016   Procedure: Laminectomy and Foraminotomy - Lumbar Five-Sacral One Right;  Surgeon: Kary Kos, MD;  Location: [place];  Service: Neurosurgery;   Laterality: Right;  Right  . MULTIPLE TOOTH EXTRACTIONS     "3"  . POSTERIOR LUMBAR FUSION  10/14/2016   L5-S1  . REPLACEMENT TOTAL KNEE Left 2003  . THORACIC AORTIC ANEURYSM REPAIR  2010    Family History  Problem Relation Age of Onset  . Heart failure Mother   . Hypertension Mother   . Asthma Mother   . Osteoporosis Mother   . Heart attack Father 17       MI  . Hypertension Sister   . Prostate cancer Neg Hx   . Bladder Cancer Neg Hx   . Kidney cancer Neg Hx   . Colon cancer Neg Hx     Social History Social History   Tobacco Use  . Smoking status: Former Smoker    Packs/day: 1.00    Years: 32.00    Pack years: 32.00    Types: Cigarettes    Last attempt to quit: 07/05/1981    Years since quitting: 36.8  . Smokeless tobacco: Never Used  Substance Use Topics  . Alcohol use: Yes    Comment: 10/19/2017 "glass of wine/month; if that"  . Drug use: Never    Allergies  Allergen Reactions  . Penicillins Hives, Swelling and Other (See Comments)    SWELLING REACTION UNSPECIFIED PATIENT HAS TAKEN AMOXICILLIN ON MED HX FROM [facility] PATIENT HAS HAD A PCN REACTION WITH IMMEDIATE RASH, FACIAL/TONGUE/THROAT SWELLING, SOB, OR LIGHTHEADEDNESS WITH HYPOTENSION:  #  #  #  YES  #  #  #   Has patient had a PCN reaction causing severe rash involving mucus membranes or skin necrosis: No Has patient had a PCN reaction that required hospitalization No Has patient had a PCN reaction occurring within the last 10 years: No  . Demerol [Meperidine] Hives and Nausea And Vomiting    Current Outpatient Medications  Medication Sig Dispense Refill  . acetaminophen (TYLENOL) 325 MG tablet Take 650 mg by mouth 4 (four) times daily.    Marland Kitchen amLODipine (NORVASC) 5 MG tablet Take 1 tablet (5 mg total) by mouth daily. 90 tablet 1  . aspirin 325 MG EC tablet Take 325 mg by mouth every evening.     Marland Kitchen atorvastatin (LIPITOR) 40 MG tablet Take 1 tablet (40 mg total) by mouth daily. (Patient taking differently:  Take 40 mg by mouth daily at 6 PM. ) 90 tablet 3  . brimonidine (ALPHAGAN) 0.2 % ophthalmic solution Place 1 drop into both eyes daily.    . cetirizine (ZYRTEC) 10 MG tablet Take 10 mg by mouth daily.    . cyclobenzaprine (FLEXERIL) 10 MG tablet Take 1 tablet (10 mg total) by mouth 2 (two) times daily as needed for muscle spasms. 30 tablet 0  . donepezil (ARICEPT) 5 MG tablet Take 5 mg by mouth at bedtime.     . fenofibrate 160 MG tablet TAKE ONE TABLET BY MOUTH EVERY DAY (Patient taking differently: TAKE ONE TABLET  BY MOUTH EVERY EVENING) 30 tablet 3  . FLUoxetine (PROZAC) 20 MG capsule TAKE 1 CAPSULE (20MG ) BY MOUTH EVERY DAY 90 capsule 1  . gabapentin (NEURONTIN) 400 MG capsule Take 400 mg by mouth 3 (three) times daily.     Marland Kitchen GAVILAX powder USE 1 CAPFUL IN 4-8 OUNCES OF LIQUID DAILY AS DIRECTED (Patient taking differently: USE 1 CAPFUL IN 4-8 OUNCES OF LIQUID DAILY AS NEEDED FOR CONSTIPATION) 510 g 0  . HYDROcodone-acetaminophen (NORCO) 10-325 MG tablet Take 1 tablet by mouth every 6 (six) hours as needed for moderate pain. (Patient taking differently: Take 1 tablet by mouth every 4 (four) hours as needed for moderate pain. ) 20 tablet 0  . hydrocortisone cream 1 % Apply 1 application topically daily as needed for itching.    Marland Kitchen lisinopril (PRINIVIL,ZESTRIL) 20 MG tablet TAKE ONE TABLET BY MOUTH EVERY EVENING 90 tablet 0  . mirabegron ER (MYRBETRIQ) 50 MG TB24 tablet Take 1 tablet (50 mg total) by mouth daily. 90 tablet 3  . omeprazole (PRILOSEC) 40 MG capsule TAKE 1 CAPSULE BY MOUTH DAILY (Patient taking differently: TAKE 1 CAPSULE (40MG ) BY MOUTH EVERY EVENING) 30 capsule 3  . testosterone cypionate (DEPOTESTOSTERONE CYPIONATE) 200 MG/ML injection INJECT 1ML INTO THE MUSCLE EVERY 14 DAYS 10 mL 0  . testosterone cypionate (DEPOTESTOSTERONE CYPIONATE) 200 MG/ML injection INJECT 64mL INTO THE MUSCLE EVERY 14 DAYS 10 mL 0  . traZODone (DESYREL) 50 MG tablet Take 50 mg by mouth at bedtime.     No  current facility-administered medications for this visit.     Review of Systems Review of Systems  Constitutional: Negative.   Respiratory: Negative.   Cardiovascular: Negative.     Blood pressure 136/70, pulse 89, temperature (!) 97.3 F (36.3 C), temperature source Skin, height 6' (1.829 m), weight 167 lb 3.2 oz (75.8 kg), SpO2 94 %.  Physical Exam Physical Exam  Constitutional: He is oriented to person, place, and time. He appears well-developed and well-nourished.  HENT:  Mouth/Throat: No oropharyngeal exudate.  Eyes: Conjunctivae are normal. No scleral icterus.  Neck: Neck supple.  Cardiovascular: Normal rate and intact distal pulses. An irregular rhythm present.  Murmur heard.  Systolic murmur is present with a grade of 2/6. Pulses:      Femoral pulses are 2+ on the right side, and 2+ on the left side. Irregular rhythm, not clearly frank atrial fibrillation.  Pulmonary/Chest: Effort normal and breath sounds normal.  Abdominal: Soft. Bowel sounds are normal. A hernia is present. Hernia confirmed positive in the right inguinal area.  Right reducible hernia   Genitourinary:     Neurological: He is alert and oriented to person, place, and time.  Skin: Skin is warm and dry.  Psychiatric: His behavior is normal.    Data Reviewed CT of the abdomen and pelvis completed April 14, 2018 with a history of right inguinal pain was reviewed. IMPRESSION: 1. No acute findings are noted in the abdomen or pelvis to account for the patient's history of right-sided inguinal pain. Specifically, no right inguinal hernia. 2. Nonobstructive calculi are noted within the collecting system of the right kidney, largest of which measures 3 mm in the upper pole. 3. Aortic atherosclerosis, in addition to at least 2 vessel coronary artery disease. Assessment for potential risk factor modification, dietary therapy or pharmacologic therapy may be warranted, if clinically indicated. 4. Colonic  diverticulosis without evidence of acute diverticulitis at this time. 5. Additional incidental findings, as above.  CBC of March 10, 2018 showed hemoglobin 11.3 with an MCV of 86, platelet count of 110,000, white blood cell count of 4100 with normal differential. Serum iron 51, improved from 1 month earlier.  Saturation ratio up to 15%.  Medical oncology note of January 27, 2018 reviewed. EGD on 09/24/2016 revealed patchy candidiasis in the entire esophagus.  There were two non-bleeding angioectasias in the duodenum treated with argon plasma coagulation.  Gastritis was biopsied.  Pathology revealed mild chronic gastritis negative for H pylori, dysplasia and malignancy.  Colonoscopy on 09/24/2016 revealed diverticulosis in the entire colon, one 3 mm polyp in the cecum, and non-bleeding internal hemorrhoids.  Pathology from the cecal polyp revealed a tubular adenoma negative for high grade dysplasia and malignancy.  Assessment    Symptomatic right inguinal hernia.    Plan    Cardiac clearance prior to surgery, Cardiologist is Dr [name].  Message received from Dr. [name] that he will arrange for evaluation based on described heart rhythm.  Hernia precautions and incarceration were discussed with the patient. If they develop symptoms of an incarcerated hernia, they were encouraged to seek prompt medical attention.  I have recommended repair of the hernia using mesh on an outpatient basis in the near future. The risk of infection was reviewed. The role of prosthetic mesh to minimize the risk of recurrence was reviewed.     HPI, Physical Exam, Assessment and Plan have been scribed under the direction and in the presence of Robert Bellow, MD. Karie Fetch, RN  The patient is scheduled for surgery at [facility] with Dr Bary Castilla on 05/05/18. He will pre admit at the hospital and we will call him with that date and time. We will send over a request for Cardiac Clearance from Dr [name]. The patient  is aware of date and instructions.  Documented by Lesly Rubenstein LPN  I have completed the exam and reviewed the above documentation for accuracy and completeness.  I agree with the above.  Haematologist has been used and any errors in dictation or transcription are unintentional.  Hervey Ard, M.D., F.A.C.S.  [place] Jianni Shelden 04/18/2018, 9:24 PM

## 2018-04-19 ENCOUNTER — Telehealth: Payer: Self-pay | Admitting: *Deleted

## 2018-04-19 ENCOUNTER — Other Ambulatory Visit: Payer: Self-pay

## 2018-04-19 ENCOUNTER — Telehealth: Payer: Self-pay | Admitting: Cardiovascular Disease

## 2018-04-19 DIAGNOSIS — E291 Testicular hypofunction: Secondary | ICD-10-CM

## 2018-04-19 NOTE — Telephone Encounter (Signed)
Patient's surgery has been scheduled for 05-05-18 at [facility] with Dr. Bary Castilla.  The patient is aware to Pre-Admit on 04-27-18 at 1 pm. Patient will check in at the [address], [address] (first floor).   The patient is aware to call the office should he have further questions.

## 2018-04-19 NOTE — Telephone Encounter (Signed)
   [facility] Pre-operative Risk Assessment    Request for surgical clearance:  1. What type of surgery is being performed? Open right ingunial hernia repair  2. When is this surgery scheduled? 05/05/2018   3. What type of clearance is required (medical clearance vs. Pharmacy clearance to hold med vs. Both)? Not listed  4. Are there any medications that need to be held prior to surgery and how long? Not listed  5. Practice name and name of physician performing surgery? [facility]  6. What is your office phone number [phone]    7.   What is your office fax number [phone]  8.   Anesthesia type (None, local, MAC, general) ? general   Lucienne Minks 04/19/2018, 8:51 AM  _________________________________________________________________   (provider comments below)

## 2018-04-23 NOTE — Telephone Encounter (Signed)
Acceptable risk for surgery, Needs preop prophylaxis ABX amox 2000 mg x 1 (if no allergy) After surgery, Would consider decreasing asa dose, guidelines support asa 81 up to 100 mg daily

## 2018-04-24 ENCOUNTER — Other Ambulatory Visit: Payer: Self-pay | Admitting: General Surgery

## 2018-04-24 ENCOUNTER — Ambulatory Visit: Payer: Medicare Other | Admitting: Urology

## 2018-04-24 ENCOUNTER — Other Ambulatory Visit: Payer: Medicare Other

## 2018-04-24 NOTE — Telephone Encounter (Signed)
Routed to number provided via EPIC fax.  

## 2018-04-26 ENCOUNTER — Telehealth: Payer: Self-pay | Admitting: Cardiovascular Disease

## 2018-04-26 ENCOUNTER — Telehealth: Payer: Self-pay | Admitting: Nutrition

## 2018-04-26 ENCOUNTER — Other Ambulatory Visit: Payer: Self-pay | Admitting: General Surgery

## 2018-04-26 DIAGNOSIS — K4090 Unilateral inguinal hernia, without obstruction or gangrene, not specified as recurrent: Secondary | ICD-10-CM

## 2018-04-26 NOTE — Telephone Encounter (Signed)
-----   Message from Aleatha Borer, LPN sent at 47/58/3074  3:55 PM EST ----- Called pt and informed of DEXA results. Also informed to begin Vit D and calcium supplements OTC as Rx'd. Pt states he would like to talk to [facility] [place] about Reclast and if interested after their discussion, would be willing to schedule an appt for infusion. Pt aware to expect a call from her to discuss further. Message routed to [place].

## 2018-04-26 NOTE — Telephone Encounter (Signed)
-----   Message from Aleatha Borer, LPN sent at 40/33/5331  3:55 PM EST ----- Called pt and informed of DEXA results. Also informed to begin Vit D and calcium supplements OTC as Rx'd. Pt states he would like to talk to [facility] about Reclast and if interested after their discussion, would be willing to schedule an appt for infusion. Pt aware to expect a call from her to discuss further. Message routed to [place].

## 2018-04-26 NOTE — Progress Notes (Signed)
Preoperative antibiotics discussed with Dr. [name] from cardiology.  Patient reports a strong allergy to penicillin, oral amoxicillin for valve protection would probably be less than ideal.  He is acceptable to having the patient received Ancef intravenously prior to the procedure.

## 2018-04-26 NOTE — Telephone Encounter (Signed)
Patient did not remember any discussion about REclast.  I told him that Dr. Loanne Drilling would like him to do this treatment, and what this entailed.  I offered to mail him something on this medication, and he said he would look it over and let me know.  He reported that he could not do this right now.  Information on Reclast was mailed to him with my card.  He was told to call me if interested in this form of treatment.

## 2018-04-26 NOTE — Telephone Encounter (Signed)
Acceptable risk from a cardiac perspective for upcoming hernia repair surgery with Dr. Bary Castilla  December 13th 2019 No further cardiac testing needed Preop antibiotics will be given by Dr. Bary Castilla in light of his bioprosthetic aortic valve He does have allergy to penicillin  Signed, Esmond Plants, MD, Ph.D [facility]

## 2018-04-27 ENCOUNTER — Other Ambulatory Visit: Payer: Self-pay

## 2018-04-27 ENCOUNTER — Encounter
Admission: RE | Admit: 2018-04-27 | Discharge: 2018-04-27 | Disposition: A | Discharge status: Home / Self Care | Payer: Medicare Other | Source: Ambulatory Visit | Attending: General Surgery | Admitting: General Surgery

## 2018-04-27 DIAGNOSIS — Z01818 Encounter for other preprocedural examination: Secondary | ICD-10-CM | POA: Insufficient documentation

## 2018-04-27 NOTE — Patient Instructions (Signed)
Your procedure is scheduled on: Friday 12/13 Report to Day Surgery. To find out your arrival time please call [phone] between 1PM - 3PM on Thurs 12/12.  Remember: Instructions that are not followed completely may result in serious medical risk,  up to and including death, or upon the discretion of your surgeon and anesthesiologist your  surgery may need to be rescheduled.     _X__ 1. Do not eat food after midnight the night before your procedure.                 No gum chewing or hard candies. You may drink clear liquids up to 2 hours                 before you are scheduled to arrive for your surgery- DO not drink clear                 liquids within 2 hours of the start of your surgery.                 Clear Liquids include:  water, apple juice without pulp, clear carbohydrate                 drink such as Clearfast of Gatorade, Black Coffee or Tea (Do not add                 anything to coffee or tea).  __X__2.  On the morning of surgery brush your teeth with toothpaste and water, you                may rinse your mouth with mouthwash if you wish.  Do not swallow any toothpaste of mouthwash.     _X__ 3.  No Alcohol for 24 hours before or after surgery.   ___ 4.  Do Not Smoke or use e-cigarettes For 24 Hours Prior to Your Surgery.                 Do not use any chewable tobacco products for at least 6 hours prior to                 surgery.  ____  5.  Bring all medications with you on the day of surgery if instructed.   __x__  6.  Notify your doctor if there is any change in your medical condition      (cold, fever, infections).     Do not wear jewelry, make-up, hairpins, clips or nail polish. Do not wear lotions, powders, or perfumes. You may wear deodorant. Do not shave 48 hours prior to surgery. Men may shave face and neck. Do not bring valuables to the hospital.    [facility] is not responsible for any belongings or valuables.  Contacts,  dentures or bridgework may not be worn into surgery. Leave your suitcase in the car. After surgery it may be brought to your room. For patients admitted to the hospital, discharge time is determined by your treatment team.   Patients discharged the day of surgery will not be allowed to drive home.   Please read over the following fact sheets that you were given:    _x___ Take these medicines the morning of surgery with A SIP OF WATER:    1. amLODipine (NORVASC) 5 MG tablet  2. cetirizine (ZYRTEC) 10 MG tablet  3. cyclobenzaprine (FLEXERIL) 10 MG tablet if needed  4.FLUoxetine (PROZAC) 20 MG capsule  5.gabapentin (NEURONTIN) 400 MG capsule  6.omeprazole (  PRILOSEC) 40 MG capsule  ____ Fleet Enema (as directed)   _x___ Use CHG Soap as directed  ____ Use inhalers on the day of surgery  ____ Stop metformin 2 days prior to surgery    ____ Take 1/2 of usual insulin dose the night before surgery. No insulin the morning          of surgery.   __x__ Reduce Aspirin to 81 mg daily until after surgery as per Dr.Gollan's instructions  ____ Stop Anti-inflammatories on    ____ Stop supplements until after surgery.    ____ Bring C-Pap to the hospital.

## 2018-05-01 DIAGNOSIS — I10 Essential (primary) hypertension: Secondary | ICD-10-CM | POA: Diagnosis not present

## 2018-05-01 DIAGNOSIS — M5440 Lumbago with sciatica, unspecified side: Secondary | ICD-10-CM | POA: Diagnosis not present

## 2018-05-04 ENCOUNTER — Other Ambulatory Visit: Payer: Medicare Other

## 2018-05-05 ENCOUNTER — Ambulatory Visit
Admission: RE | Admit: 2018-05-05 | Discharge: 2018-05-05 | Disposition: A | Discharge status: Home / Self Care | Payer: Medicare Other | Source: Ambulatory Visit | Attending: General Surgery | Admitting: General Surgery

## 2018-05-05 ENCOUNTER — Encounter
Admission: RE | Disposition: A | Discharge status: Home / Self Care | Payer: Self-pay | Source: Ambulatory Visit | Attending: General Surgery

## 2018-05-05 ENCOUNTER — Ambulatory Visit: Payer: Medicare Other | Admitting: Hematology and Oncology

## 2018-05-05 ENCOUNTER — Ambulatory Visit: Payer: Medicare Other

## 2018-05-05 ENCOUNTER — Other Ambulatory Visit: Payer: Self-pay

## 2018-05-05 DIAGNOSIS — K4090 Unilateral inguinal hernia, without obstruction or gangrene, not specified as recurrent: Secondary | ICD-10-CM | POA: Diagnosis not present

## 2018-05-05 DIAGNOSIS — Z7982 Long term (current) use of aspirin: Secondary | ICD-10-CM | POA: Diagnosis not present

## 2018-05-05 DIAGNOSIS — F209 Schizophrenia, unspecified: Secondary | ICD-10-CM | POA: Insufficient documentation

## 2018-05-05 DIAGNOSIS — F419 Anxiety disorder, unspecified: Secondary | ICD-10-CM | POA: Insufficient documentation

## 2018-05-05 DIAGNOSIS — M069 Rheumatoid arthritis, unspecified: Secondary | ICD-10-CM | POA: Insufficient documentation

## 2018-05-05 DIAGNOSIS — K219 Gastro-esophageal reflux disease without esophagitis: Secondary | ICD-10-CM | POA: Insufficient documentation

## 2018-05-05 DIAGNOSIS — I10 Essential (primary) hypertension: Secondary | ICD-10-CM | POA: Diagnosis not present

## 2018-05-05 DIAGNOSIS — E785 Hyperlipidemia, unspecified: Secondary | ICD-10-CM | POA: Diagnosis not present

## 2018-05-05 DIAGNOSIS — Z953 Presence of xenogenic heart valve: Secondary | ICD-10-CM | POA: Diagnosis not present

## 2018-05-05 DIAGNOSIS — I480 Paroxysmal atrial fibrillation: Secondary | ICD-10-CM | POA: Insufficient documentation

## 2018-05-05 DIAGNOSIS — F329 Major depressive disorder, single episode, unspecified: Secondary | ICD-10-CM | POA: Diagnosis not present

## 2018-05-05 DIAGNOSIS — G629 Polyneuropathy, unspecified: Secondary | ICD-10-CM | POA: Insufficient documentation

## 2018-05-05 DIAGNOSIS — Z87891 Personal history of nicotine dependence: Secondary | ICD-10-CM | POA: Insufficient documentation

## 2018-05-05 DIAGNOSIS — I2510 Atherosclerotic heart disease of native coronary artery without angina pectoris: Secondary | ICD-10-CM | POA: Diagnosis not present

## 2018-05-05 DIAGNOSIS — Z79899 Other long term (current) drug therapy: Secondary | ICD-10-CM | POA: Insufficient documentation

## 2018-05-05 HISTORY — PX: INGUINAL HERNIA REPAIR: SHX194

## 2018-05-05 SURGERY — REPAIR, HERNIA, INGUINAL, ADULT
Anesthesia: General | Laterality: Right

## 2018-05-05 MED ORDER — CEFAZOLIN SODIUM-DEXTROSE 2-4 GM/100ML-% IV SOLN
2.0000 g | INTRAVENOUS | Status: AC
  Administered 2018-05-05: 2 g via INTRAVENOUS

## 2018-05-05 MED ORDER — DEXAMETHASONE SODIUM PHOSPHATE 10 MG/ML IJ SOLN
INTRAMUSCULAR | Status: DC | PRN
  Administered 2018-05-05: 4 mg via INTRAVENOUS

## 2018-05-05 MED ORDER — FAMOTIDINE 20 MG PO TABS
ORAL_TABLET | ORAL | Status: AC
  Filled 2018-05-05: qty 1

## 2018-05-05 MED ORDER — FENTANYL CITRATE (PF) 100 MCG/2ML IJ SOLN
INTRAMUSCULAR | Status: DC | PRN
  Administered 2018-05-05 (×2): 50 ug via INTRAVENOUS

## 2018-05-05 MED ORDER — BUPIVACAINE-EPINEPHRINE (PF) 0.25% -1:200000 IJ SOLN
INTRAMUSCULAR | Status: AC
  Filled 2018-05-05: qty 30

## 2018-05-05 MED ORDER — FENTANYL CITRATE (PF) 100 MCG/2ML IJ SOLN
INTRAMUSCULAR | Status: AC
  Filled 2018-05-05: qty 2

## 2018-05-05 MED ORDER — FENTANYL CITRATE (PF) 100 MCG/2ML IJ SOLN: 25.0000 ug | INTRAMUSCULAR | Status: DC | PRN

## 2018-05-05 MED ORDER — KETOROLAC TROMETHAMINE 30 MG/ML IJ SOLN
INTRAMUSCULAR | Status: AC
  Filled 2018-05-05: qty 1

## 2018-05-05 MED ORDER — EPHEDRINE SULFATE 50 MG/ML IJ SOLN
INTRAMUSCULAR | Status: DC | PRN
  Administered 2018-05-05: 5 mg via INTRAVENOUS
  Administered 2018-05-05: 10 mg via INTRAVENOUS
  Administered 2018-05-05 (×2): 5 mg via INTRAVENOUS

## 2018-05-05 MED ORDER — BUPIVACAINE-EPINEPHRINE (PF) 0.25% -1:200000 IJ SOLN
INTRAMUSCULAR | Status: DC | PRN
  Administered 2018-05-05: 20 mL

## 2018-05-05 MED ORDER — LIDOCAINE HCL (CARDIAC) PF 100 MG/5ML IV SOSY
PREFILLED_SYRINGE | INTRAVENOUS | Status: DC | PRN
  Administered 2018-05-05: 100 mg via INTRAVENOUS

## 2018-05-05 MED ORDER — ONDANSETRON HCL 4 MG/2ML IJ SOLN: 4.0000 mg | Freq: Once | INTRAMUSCULAR | Status: DC | PRN

## 2018-05-05 MED ORDER — DEXAMETHASONE SODIUM PHOSPHATE 10 MG/ML IJ SOLN
INTRAMUSCULAR | Status: AC
  Filled 2018-05-05: qty 1

## 2018-05-05 MED ORDER — CEFAZOLIN SODIUM-DEXTROSE 2-4 GM/100ML-% IV SOLN
INTRAVENOUS | Status: AC
  Filled 2018-05-05: qty 100

## 2018-05-05 MED ORDER — LACTATED RINGERS IV SOLN
INTRAVENOUS | Status: DC
  Administered 2018-05-05: 11:00:00 via INTRAVENOUS

## 2018-05-05 MED ORDER — ONDANSETRON HCL 4 MG/2ML IJ SOLN
INTRAMUSCULAR | Status: AC
  Filled 2018-05-05: qty 2

## 2018-05-05 MED ORDER — ACETAMINOPHEN 10 MG/ML IV SOLN
INTRAVENOUS | Status: AC
  Filled 2018-05-05: qty 100

## 2018-05-05 MED ORDER — HYDROCODONE-ACETAMINOPHEN 5-325 MG PO TABS: 1.0000 | ORAL_TABLET | ORAL | 0 refills | Status: DC | PRN

## 2018-05-05 MED ORDER — ACETAMINOPHEN 10 MG/ML IV SOLN
INTRAVENOUS | Status: DC | PRN
  Administered 2018-05-05: 1000 mg via INTRAVENOUS

## 2018-05-05 MED ORDER — PROPOFOL 10 MG/ML IV BOLUS
INTRAVENOUS | Status: DC | PRN
  Administered 2018-05-05: 50 mg via INTRAVENOUS
  Administered 2018-05-05: 150 mg via INTRAVENOUS

## 2018-05-05 MED ORDER — ONDANSETRON HCL 4 MG/2ML IJ SOLN
INTRAMUSCULAR | Status: DC | PRN
  Administered 2018-05-05: 4 mg via INTRAVENOUS

## 2018-05-05 MED ORDER — PROPOFOL 10 MG/ML IV BOLUS
INTRAVENOUS | Status: AC
  Filled 2018-05-05: qty 20

## 2018-05-05 MED ORDER — HYDROCODONE-ACETAMINOPHEN 5-325 MG PO TABS: 1.0000 | ORAL_TABLET | ORAL | Status: DC | PRN

## 2018-05-05 SURGICAL SUPPLY — 33 items
BLADE SURG 15 STRL SS SAFETY (BLADE) ×4 IMPLANT
CANISTER SUCT 1200ML W/VALVE (MISCELLANEOUS) ×2 IMPLANT
CHLORAPREP W/TINT 26ML (MISCELLANEOUS) ×2 IMPLANT
COVER WAND RF STERILE (DRAPES) ×1 IMPLANT
DECANTER SPIKE VIAL GLASS SM (MISCELLANEOUS) ×1 IMPLANT
DRAIN PENROSE 1/4X12 LTX (DRAIN) ×2 IMPLANT
DRAPE LAPAROTOMY 100X77 ABD (DRAPES) ×2 IMPLANT
DRSG TEGADERM 4X4.75 (GAUZE/BANDAGES/DRESSINGS) ×2 IMPLANT
DRSG TELFA 4X3 1S NADH ST (GAUZE/BANDAGES/DRESSINGS) ×2 IMPLANT
ELECT REM PT RETURN 9FT ADLT (ELECTROSURGICAL) ×2
ELECTRODE REM PT RTRN 9FT ADLT (ELECTROSURGICAL) ×1 IMPLANT
GLOVE BIO SURGEON STRL SZ7.5 (GLOVE) ×3 IMPLANT
GLOVE INDICATOR 8.0 STRL GRN (GLOVE) ×3 IMPLANT
GOWN STRL REUS W/ TWL LRG LVL3 (GOWN DISPOSABLE) ×2 IMPLANT
GOWN STRL REUS W/TWL LRG LVL3 (GOWN DISPOSABLE) ×2
KIT TURNOVER KIT A (KITS) ×2 IMPLANT
LABEL OR SOLS (LABEL) ×2 IMPLANT
MESH MARLEX PLUG MEDIUM (Mesh General) ×2 IMPLANT
NEEDLE HYPO 22GX1.5 SAFETY (NEEDLE) ×4 IMPLANT
PACK BASIN MINOR ARMC (MISCELLANEOUS) ×2 IMPLANT
STRIP CLOSURE SKIN 1/2X4 (GAUZE/BANDAGES/DRESSINGS) ×2 IMPLANT
SUT PDS AB 0 CT1 27 (SUTURE) ×2 IMPLANT
SUT SURGILON 0 BLK (SUTURE) ×4 IMPLANT
SUT VIC AB 2-0 SH 27 (SUTURE) ×1
SUT VIC AB 2-0 SH 27XBRD (SUTURE) ×1 IMPLANT
SUT VIC AB 3-0 54X BRD REEL (SUTURE) ×1 IMPLANT
SUT VIC AB 3-0 BRD 54 (SUTURE) ×1
SUT VIC AB 3-0 SH 27 (SUTURE) ×1
SUT VIC AB 3-0 SH 27X BRD (SUTURE) ×1 IMPLANT
SUT VIC AB 4-0 FS2 27 (SUTURE) ×2 IMPLANT
SWABSTK COMLB BENZOIN TINCTURE (MISCELLANEOUS) ×2 IMPLANT
SYR 10ML LL (SYRINGE) ×2 IMPLANT
SYR 3ML LL SCALE MARK (SYRINGE) ×2 IMPLANT

## 2018-05-05 NOTE — Transfer of Care (Signed)
Immediate Anesthesia Transfer of Care Note  Patient: Bryan [name]  Procedure(s) Performed: HERNIA REPAIR INGUINAL ADULT (Right )  Patient Location: PACU  Anesthesia Type:General  Level of Consciousness: sedated  Airway & Oxygen Therapy: Patient connected to face mask oxygen  Post-op Assessment: Post -op Vital signs reviewed and stable  Post vital signs: stable  Last Vitals:  Vitals Value Taken Time  BP    Temp    Pulse 77 05/05/2018  1:10 PM  Resp 15 05/05/2018  1:10 PM  SpO2 100 % 05/05/2018  1:10 PM  Vitals shown include unvalidated device data.  Last Pain:  Vitals:   05/05/18 1150  TempSrc: Tympanic  PainSc: 3          Complications: No apparent anesthesia complications

## 2018-05-05 NOTE — Anesthesia Procedure Notes (Signed)
Procedure Name: LMA Insertion Date/Time: 05/05/2018 12:24 PM Performed by: Aline Brochure, CRNA Pre-anesthesia Checklist: Patient identified, Patient being monitored, Timeout performed, Emergency Drugs available and Suction available Patient Re-evaluated:Patient Re-evaluated prior to induction Oxygen Delivery Method: Circle system utilized Preoxygenation: Pre-oxygenation with 100% oxygen Induction Type: IV induction Ventilation: Mask ventilation without difficulty LMA: LMA inserted LMA Size: 4.5 Tube type: Oral Number of attempts: 1 Placement Confirmation: positive ETCO2 and breath sounds checked- equal and bilateral Tube secured with: Tape Dental Injury: Teeth and Oropharynx as per pre-operative assessment

## 2018-05-05 NOTE — Discharge Instructions (Signed)

## 2018-05-05 NOTE — Anesthesia Preprocedure Evaluation (Signed)
Anesthesia Evaluation  Patient identified by MRN, date of birth, ID band Patient awake    Reviewed: Allergy & Precautions, NPO status , Patient's Chart, lab work & pertinent test results  History of Anesthesia Complications Negative for: history of anesthetic complications  Airway Mallampati: III       Dental   Pulmonary neg sleep apnea, neg COPD, former smoker,           Cardiovascular hypertension, Pt. on medications (-) Past MI and (-) CHF (-) dysrhythmias + Valvular Problems/Murmurs (s/p aortic valve replacement)      Neuro/Psych neg Seizures Anxiety Depression Schizophrenia    GI/Hepatic Neg liver ROS, GERD  Medicated and Controlled,  Endo/Other  neg diabetes  Renal/GU Renal disease     Musculoskeletal   Abdominal   Peds  Hematology  (+) anemia ,   Anesthesia Other Findings   Reproductive/Obstetrics                             Anesthesia Physical Anesthesia Plan  ASA: III  Anesthesia Plan: General   Post-op Pain Management:    Induction: Intravenous  PONV Risk Score and Plan: 2 and Ondansetron and Dexamethasone  Airway Management Planned: LMA  Additional Equipment:   Intra-op Plan:   Post-operative Plan:   Informed Consent: I have reviewed the patients History and Physical, chart, labs and discussed the procedure including the risks, benefits and alternatives for the proposed anesthesia with the patient or authorized representative who has indicated his/her understanding and acceptance.     Plan Discussed with:   Anesthesia Plan Comments:         Anesthesia Quick Evaluation

## 2018-05-05 NOTE — Op Note (Signed)
Preoperative diagnosis: Symptomatic right inguinal hernia.  Postoperative diagnosis: Same.  Operative procedure: Right inguinal hernia repair with medium Bard PerFix plug.  Operating surgeon Hervey Ard, MD.  Anesthesia: General by LMA.  0.25% Marcaine with 1-200,000's of epinephrine, 30 cc.  Toradol 30 mg  Estimated blood loss: 5 cc.  Clinical note: This 77 year old male developed a hernia after recovery from recent back surgery.  This become increasingly symptomatic.  He was omitted for elective repair.  He received Ancef prior to the procedure.  SCD stockings for DVT prevention.  Operative note: With the patient under adequate general anesthesia the abdomen was prepped with ChloraPrep and draped.  Field block anesthesia was established.  A 5 cm skin line incision along the anticipated course the inguinal canal was carried down through skin subtendinous tissue with hemostasis achieved by electrocautery and 3-0 Vicryl ties.  The external oblique was opened in the direction of its fibers.  The ileo-inguinal nerve was identified but the iliohypogastric nerve was not.  The genitofemoral nerve artery and vein bundles were seen.  There was a sizable indirect sac.  This was firmly adherent to the cord structures.  This was taken down with sharp and cautery dissection.  Hemostasis was electrocautery.  The sac was freed in the preperitoneal space.  A medium Bard PerFix plug was then placed to the internal ring and anchored into position to the iliopubic tract.  The external component of mesh laid along the floor the inguinal canal.  This was anchored to the pubic tubercle and then along the inguinal ligament with interrupted 0 Surgilon sutures.  The medial and superior borders were anchored to the transverse abdominis aponeurosis in a similar fashion.  The lateral slit was closed with sutures.  Toradol and Marcaine was placed into the wound at this level based bar for postoperative analgesia.  The  external Bleich was closed with a running 2-0 Vicryl suture.  Scarpa's fascia was closed with a running 3-0 Vicryl suture.  The skin was closed with a running 4-0 Vicryl subcuticular suture.  Benzoin Steri-Strips followed by Telfa and Tegaderm dressing was applied.  The patient tolerated the procedure well and was taken to the recovery room in stable condition.

## 2018-05-05 NOTE — H&P (Signed)
No change in clinical history or exam. For right inguinal hernia repair.  

## 2018-05-05 NOTE — Anesthesia Post-op Follow-up Note (Signed)
Anesthesia QCDR form completed.        

## 2018-05-05 NOTE — Anesthesia Postprocedure Evaluation (Signed)
Anesthesia Post Note  Patient: Bryan [name]  Procedure(s) Performed: HERNIA REPAIR INGUINAL ADULT (Right )  Patient location during evaluation: PACU Anesthesia Type: General Level of consciousness: awake and alert Pain management: pain level controlled Vital Signs Assessment: post-procedure vital signs reviewed and stable Respiratory status: spontaneous breathing and respiratory function stable Cardiovascular status: stable Anesthetic complications: no     Last Vitals:  Vitals:   05/05/18 1353 05/05/18 1421  BP: (!) 155/68 (!) 160/65  Pulse: 64 66  Resp: 16 16  Temp: (!) 36.2 C   SpO2: 97% 96%    Last Pain:  Vitals:   05/05/18 1353  TempSrc: Temporal  PainSc: 0-No pain                 Canesha Tesfaye K

## 2018-05-07 ENCOUNTER — Encounter: Payer: Self-pay | Admitting: General Surgery

## 2018-05-10 ENCOUNTER — Inpatient Hospital Stay: Payer: Medicare Other | Attending: Hematology and Oncology

## 2018-05-11 ENCOUNTER — Encounter: Payer: Self-pay | Admitting: General Surgery

## 2018-05-11 ENCOUNTER — Other Ambulatory Visit: Payer: Self-pay

## 2018-05-11 ENCOUNTER — Ambulatory Visit (INDEPENDENT_AMBULATORY_CARE_PROVIDER_SITE_OTHER): Payer: Medicare Other | Admitting: General Surgery

## 2018-05-11 VITALS — BP 152/80 | HR 89 | Temp 97.3°F | Resp 18 | Ht 72.0 in | Wt 174.2 lb

## 2018-05-11 DIAGNOSIS — K4090 Unilateral inguinal hernia, without obstruction or gangrene, not specified as recurrent: Secondary | ICD-10-CM

## 2018-05-11 NOTE — Patient Instructions (Signed)
Patient is to return to the office in 2 months. Drive when comfortable.  Call the office with any questions or concerns

## 2018-05-11 NOTE — Progress Notes (Signed)
Patient ID: Bryan [name], male   DOB: 1941-01-25, 77 y.o.   MRN: [id number]  Chief Complaint  Patient presents with  . Routine Post Op    2 week post op RIH repair, Sx 05/05/18, Dr Bary Castilla *PT REQUESTED TO SEE JWB    HPI Bryan [name] is a 77 y.o. male here today for follow up for right inguinal hernia repair. Patients states he is still sore but otherwise feeling well.  The patient reports no difficulty with bowel or bladder function.  Most of the discomfort began 36 hours after surgery.  Not presently requiring narcotics. HPI  Past Medical History:  Diagnosis Date  . Abnormal CT scan    Asymmetric left rectal wall thickening   . Anemia   . Anxiety   . Arrhythmia   . Chronic lower back pain   . Complication of anesthesia    Memory loss 09/2015  . Coronary artery disease   . Depression   . GERD (gastroesophageal reflux disease)   . Glaucoma   . H/O thoracic aortic aneurysm repair   . Heart murmur   . History of being hospitalized    memory lose kidney funtion down blood pressure up  . History of blood transfusion    "think he had one when he had heart valve OR" (10/14/2016); "none since" (10/19/2017)  . History of chicken pox   . Hyperlipidemia   . Hypertension   . Lumbar stenosis   . Murmur   . Neuropathy   . Osteoarthritis    worse in feet and ankles  . Paroxysmal A-fib ([place])   . Poor short term memory    takes Aricept  . Rheumatoid arthritis ([place])    "all over" (10/14/2016)  . Schizophrenia ([place])   . Seasonal allergies   . Valvular heart disease     Past Surgical History:  Procedure Laterality Date  . AORTIC VALVE REPLACEMENT  2007   [facility].  [place], [place]; "pig valve"  . APPENDECTOMY  2010  . BACK SURGERY    . CARDIAC VALVE REPLACEMENT    . CATARACT EXTRACTION W/ INTRAOCULAR LENS  IMPLANT, BILATERAL Bilateral 2012  . COLONOSCOPY WITH PROPOFOL N/A 09/24/2016   Procedure: COLONOSCOPY WITH PROPOFOL;  Surgeon: Jonathon Bellows, MD;  Location:  [facility] ENDOSCOPY;  Service: Endoscopy;  Laterality: N/A;  . ESOPHAGOGASTRODUODENOSCOPY (EGD) WITH PROPOFOL N/A 09/24/2016   Procedure: ESOPHAGOGASTRODUODENOSCOPY (EGD) WITH PROPOFOL;  Surgeon: Jonathon Bellows, MD;  Location: [facility] ENDOSCOPY;  Service: Endoscopy;  Laterality: N/A;  . GIVENS CAPSULE STUDY N/A 09/24/2016   Procedure: GIVENS CAPSULE STUDY;  Surgeon: Jonathon Bellows, MD;  Location: [facility] ENDOSCOPY;  Service: Endoscopy;  Laterality: N/A;  . HAMMER TOE SURGERY Right 10/09/2015   Procedure: HAMMER TOE REPAIR WITH K-WIRE FIXATION RIGHT SECOND TOE;  Surgeon: Albertine Patricia, DPM;  Location: [place];  Service: Podiatry;  Laterality: Right;  WITH LOCAL  . INGUINAL HERNIA REPAIR Right 05/05/2018   Medium Bard PerFix plug.  Surgeon: Robert Bellow, MD;  Location: [facility] ORS;  Service: General;  Laterality: Right;  . JOINT REPLACEMENT Left    left total knee  . LAMINECTOMY WITH POSTERIOR LATERAL ARTHRODESIS LEVEL 3 N/A 09/28/2017   Procedure: LUMBAR  POSTEROR  FUSION REVISION - LUMBAR ONE -TWO, LUMBAR TWO -THREE,THREE-FOUR ,BILATERAL ARTHRODESIS REMOVAL LUMBAR THREE HARDWARE;  Surgeon: Kary Kos, MD;  Location: [place];  Service: Neurosurgery;  Laterality: N/A;  . LAMINECTOMY WITH POSTERIOR LATERAL ARTHRODESIS LEVEL 3 N/A 10/20/2017   Procedure: Revision fusion and  removal of hardware Lumbar one, Pedicle screw fixation thoracic ten-lumbar two, thoracic nine-ten laminotomy, Posterior Lumbar Arthrodesis thoracic ten-lumbar two ;  Surgeon: Kary Kos, MD;  Location: [place];  Service: Neurosurgery;  Laterality: N/A;  . LAPAROSCOPIC CHOLECYSTECTOMY  2010  . LUMBAR FUSION Right 06/08/2017   LUMBAR THREE-FOUR, LUMBAR FOUR-FIVE POSTEROLATERAL ARTHRODESIS WITH RIGHT LUMBAR FOUR-FIVE LAMINECTOMY/FORAMINOTOMY  . LUMBAR LAMINECTOMY/DECOMPRESSION MICRODISCECTOMY Right 03/08/2016   Procedure: Laminectomy and Foraminotomy - Lumbar Five-Sacral One Right;  Surgeon: Kary Kos, MD;  Location: [place];  Service:  Neurosurgery;  Laterality: Right;  Right  . MULTIPLE TOOTH EXTRACTIONS     "3"  . POSTERIOR LUMBAR FUSION  10/14/2016   L5-S1  . REPLACEMENT TOTAL KNEE Left 2003  . THORACIC AORTIC ANEURYSM REPAIR  2010    Family History  Problem Relation Age of Onset  . Heart failure Mother   . Hypertension Mother   . Asthma Mother   . Osteoporosis Mother   . Heart attack Father 38       MI  . Hypertension Sister   . Prostate cancer Neg Hx   . Bladder Cancer Neg Hx   . Kidney cancer Neg Hx   . Colon cancer Neg Hx     Social History Social History   Tobacco Use  . Smoking status: Former Smoker    Packs/day: 1.00    Years: 32.00    Pack years: 32.00    Types: Cigarettes    Last attempt to quit: 07/05/1981    Years since quitting: 36.8  . Smokeless tobacco: Never Used  Substance Use Topics  . Alcohol use: Yes    Comment: 10/19/2017 "glass of wine/month; if that"  . Drug use: Never    Allergies  Allergen Reactions  . Penicillins Hives, Swelling and Other (See Comments)    SWELLING REACTION UNSPECIFIED PATIENT HAS TAKEN AMOXICILLIN ON MED HX FROM [facility] PATIENT HAS HAD A PCN REACTION WITH IMMEDIATE RASH, FACIAL/TONGUE/THROAT SWELLING, SOB, OR LIGHTHEADEDNESS WITH HYPOTENSION:  #  #  #  YES  #  #  #   Has patient had a PCN reaction causing severe rash involving mucus membranes or skin necrosis: No Has patient had a PCN reaction that required hospitalization No Has patient had a PCN reaction occurring within the last 10 years: No  . Demerol [Meperidine] Hives and Nausea And Vomiting    Current Outpatient Medications  Medication Sig Dispense Refill  . acetaminophen (TYLENOL) 325 MG tablet Take 650 mg by mouth every 6 (six) hours as needed for moderate pain or headache.     Marland Kitchen amLODipine (NORVASC) 5 MG tablet Take 1 tablet (5 mg total) by mouth daily. 90 tablet 1  . aspirin 325 MG EC tablet Take 325 mg by mouth every evening.     Marland Kitchen aspirin 81 MG tablet Take 81 mg by mouth daily. Switched  from 325mg  for procedure    . atorvastatin (LIPITOR) 40 MG tablet Take 1 tablet (40 mg total) by mouth daily. (Patient taking differently: Take 40 mg by mouth at bedtime. ) 90 tablet 3  . cetirizine (ZYRTEC) 10 MG tablet Take 10 mg by mouth daily.    . cyclobenzaprine (FLEXERIL) 10 MG tablet Take 1 tablet (10 mg total) by mouth 2 (two) times daily as needed for muscle spasms. 30 tablet 0  . donepezil (ARICEPT) 5 MG tablet Take 5 mg by mouth at bedtime.     . fenofibrate 160 MG tablet TAKE ONE TABLET BY MOUTH EVERY DAY (Patient  taking differently: Take 160 mg by mouth at bedtime. ) 30 tablet 3  . FLUoxetine (PROZAC) 20 MG capsule TAKE 1 CAPSULE (20MG ) BY MOUTH EVERY DAY (Patient taking differently: Take 20 mg by mouth daily. ) 90 capsule 1  . gabapentin (NEURONTIN) 400 MG capsule Take 400 mg by mouth 3 (three) times daily.     Marland Kitchen GAVILAX powder USE 1 CAPFUL IN 4-8 OUNCES OF LIQUID DAILY AS DIRECTED (Patient taking differently: Take 17 g by mouth every other day. ) 510 g 0  . HYDROcodone-acetaminophen (NORCO) 10-325 MG tablet Take 1 tablet by mouth every 6 (six) hours as needed for moderate pain. 20 tablet 0  . HYDROcodone-acetaminophen (NORCO) 5-325 MG tablet Take 1 tablet by mouth every 4 (four) hours as needed for moderate pain. 15 tablet 0  . hydrocortisone cream 1 % Apply 1 application topically daily as needed for itching.    . hydroxychloroquine (PLAQUENIL) 200 MG tablet Take 200 mg by mouth daily. Every other day    . lisinopril (PRINIVIL,ZESTRIL) 20 MG tablet TAKE ONE TABLET BY MOUTH EVERY EVENING (Patient taking differently: Take 20 mg by mouth every evening. ) 90 tablet 0  . mirabegron ER (MYRBETRIQ) 50 MG TB24 tablet Take 1 tablet (50 mg total) by mouth daily. 90 tablet 3  . omeprazole (PRILOSEC) 40 MG capsule TAKE 1 CAPSULE BY MOUTH DAILY (Patient taking differently: Take 40 mg by mouth every evening. ) 30 capsule 3  . testosterone cypionate (DEPOTESTOSTERONE CYPIONATE) 200 MG/ML injection  INJECT 58mL INTO THE MUSCLE EVERY 14 DAYS (Patient taking differently: Inject 200 mg into the muscle every 14 (fourteen) days. ) 10 mL 0  . traZODone (DESYREL) 50 MG tablet Take 50 mg by mouth at bedtime.     No current facility-administered medications for this visit.     Review of Systems Review of Systems  Constitutional: Negative.   Respiratory: Negative.   Cardiovascular: Negative.     Blood pressure (!) 152/80, pulse 89, temperature (!) 97.3 F (36.3 C), temperature source Temporal, resp. rate 18, height 6' (1.829 m), weight 174 lb 3.2 oz (79 kg), SpO2 93 %.  Physical Exam Physical Exam Constitutional:      Appearance: He is well-developed.  Eyes:     General: No scleral icterus.    Conjunctiva/sclera: Conjunctivae normal.  Cardiovascular:     Rate and Rhythm: Normal rate and regular rhythm.     Heart sounds: Normal heart sounds.  Pulmonary:     Effort: Pulmonary effort is normal.     Breath sounds: Normal breath sounds.  Genitourinary:   Lymphadenopathy:     Cervical: No cervical adenopathy.  Skin:    General: Skin is warm and dry.  Neurological:     Mental Status: He is alert and oriented to person, place, and time.       Assessment    Doing well post right inguinal hernia repair.    Plan Patient is to return to the office in 2 months. Drive when comfortable.  Heating pad to abdomen for comfort and to help resolve residual edema.     HPI, Physical Exam, Assessment and Plan have been scribed under the direction and in the presence of Hervey Ard, Md.  Eudelia Bunch R. Bobette Mo, CMA [place] [facility] [place] 05/11/2018, 2:03 PM

## 2018-05-12 ENCOUNTER — Inpatient Hospital Stay: Payer: Medicare Other | Admitting: Hematology and Oncology

## 2018-05-12 ENCOUNTER — Inpatient Hospital Stay: Payer: Medicare Other

## 2018-05-12 NOTE — Progress Notes (Deleted)
[facility] day:  05/12/2018   Chief Complaint: Bryan [name] is a 77 y.o. male with rheumatoid arthritis, iron deficiency anemia, and mild pancytopenia who is seen for 3 month assessment.  HPI:  The patient was last seen in the hematology clinic on  01/27/2018.  At that time, he remained fatigued.  Exam was stable.  Hemoglobin was 9.9.  Platelet count was 117,000.  WBC was 3300 ([place] 2300).  We discussed consideration of repeat bone marrow.  CBC on 03/10/2018 revealed a hematocrit of 35, hemoglobin 11.3, MCV 86.2, platelets 110,000, WBC 4100 with an ANC of 2700.  Differential was unremarkable.  He was seen by Zara Council in the urology clinic on 04/12/2018 for LUTS.  Notes reviewed.  He receives testosterone cypionate injections (200 mg) by [facility] every 14 days.  Testosterone level was 34.  He underwent right inguinal hernia repair by Dr. Bary Castilla on 05/05/2018.  During the interim,     Past Medical History:  Diagnosis Date  . Abnormal CT scan    Asymmetric left rectal wall thickening   . Anemia   . Anxiety   . Arrhythmia   . Chronic lower back pain   . Complication of anesthesia    Memory loss 09/2015  . Coronary artery disease   . Depression   . GERD (gastroesophageal reflux disease)   . Glaucoma   . H/O thoracic aortic aneurysm repair   . Heart murmur   . History of being hospitalized    memory lose kidney funtion down blood pressure up  . History of blood transfusion    "think he had one when he had heart valve OR" (10/14/2016); "none since" (10/19/2017)  . History of chicken pox   . Hyperlipidemia   . Hypertension   . Lumbar stenosis   . Murmur   . Neuropathy   . Osteoarthritis    worse in feet and ankles  . Paroxysmal A-fib ([place])   . Poor short term memory    takes Aricept  . Rheumatoid arthritis ([place])    "all over" (10/14/2016)  . Schizophrenia ([place])   . Seasonal allergies   . Valvular heart  disease     Past Surgical History:  Procedure Laterality Date  . AORTIC VALVE REPLACEMENT  2007   [facility].  [place], [place]; "pig valve"  . APPENDECTOMY  2010  . BACK SURGERY    . CARDIAC VALVE REPLACEMENT    . CATARACT EXTRACTION W/ INTRAOCULAR LENS  IMPLANT, BILATERAL Bilateral 2012  . COLONOSCOPY WITH PROPOFOL N/A 09/24/2016   Procedure: COLONOSCOPY WITH PROPOFOL;  Surgeon: Jonathon Bellows, MD;  Location: [facility] ENDOSCOPY;  Service: Endoscopy;  Laterality: N/A;  . ESOPHAGOGASTRODUODENOSCOPY (EGD) WITH PROPOFOL N/A 09/24/2016   Procedure: ESOPHAGOGASTRODUODENOSCOPY (EGD) WITH PROPOFOL;  Surgeon: Jonathon Bellows, MD;  Location: [facility] ENDOSCOPY;  Service: Endoscopy;  Laterality: N/A;  . GIVENS CAPSULE STUDY N/A 09/24/2016   Procedure: GIVENS CAPSULE STUDY;  Surgeon: Jonathon Bellows, MD;  Location: [facility] ENDOSCOPY;  Service: Endoscopy;  Laterality: N/A;  . HAMMER TOE SURGERY Right 10/09/2015   Procedure: HAMMER TOE REPAIR WITH K-WIRE FIXATION RIGHT SECOND TOE;  Surgeon: Albertine Patricia, DPM;  Location: [place];  Service: Podiatry;  Laterality: Right;  WITH LOCAL  . INGUINAL HERNIA REPAIR Right 05/05/2018   Medium Bard PerFix plug.  Surgeon: Robert Bellow, MD;  Location: [facility] ORS;  Service: General;  Laterality: Right;  . JOINT REPLACEMENT Left    left  total knee  . LAMINECTOMY WITH POSTERIOR LATERAL ARTHRODESIS LEVEL 3 N/A 09/28/2017   Procedure: LUMBAR  POSTEROR  FUSION REVISION - LUMBAR ONE -TWO, LUMBAR TWO -THREE,THREE-FOUR ,BILATERAL ARTHRODESIS REMOVAL LUMBAR THREE HARDWARE;  Surgeon: Kary Kos, MD;  Location: [place];  Service: Neurosurgery;  Laterality: N/A;  . LAMINECTOMY WITH POSTERIOR LATERAL ARTHRODESIS LEVEL 3 N/A 10/20/2017   Procedure: Revision fusion and removal of hardware Lumbar one, Pedicle screw fixation thoracic ten-lumbar two, thoracic nine-ten laminotomy, Posterior Lumbar Arthrodesis thoracic ten-lumbar two ;  Surgeon: Kary Kos, MD;  Location: [place];  Service:  Neurosurgery;  Laterality: N/A;  . LAPAROSCOPIC CHOLECYSTECTOMY  2010  . LUMBAR FUSION Right 06/08/2017   LUMBAR THREE-FOUR, LUMBAR FOUR-FIVE POSTEROLATERAL ARTHRODESIS WITH RIGHT LUMBAR FOUR-FIVE LAMINECTOMY/FORAMINOTOMY  . LUMBAR LAMINECTOMY/DECOMPRESSION MICRODISCECTOMY Right 03/08/2016   Procedure: Laminectomy and Foraminotomy - Lumbar Five-Sacral One Right;  Surgeon: Kary Kos, MD;  Location: [place];  Service: Neurosurgery;  Laterality: Right;  Right  . MULTIPLE TOOTH EXTRACTIONS     "3"  . POSTERIOR LUMBAR FUSION  10/14/2016   L5-S1  . REPLACEMENT TOTAL KNEE Left 2003  . THORACIC AORTIC ANEURYSM REPAIR  2010    Family History  Problem Relation Age of Onset  . Heart failure Mother   . Hypertension Mother   . Asthma Mother   . Osteoporosis Mother   . Heart attack Father 77       MI  . Hypertension Sister   . Prostate cancer Neg Hx   . Bladder Cancer Neg Hx   . Kidney cancer Neg Hx   . Colon cancer Neg Hx     Social History:  reports that he quit smoking about 36 years ago. His smoking use included cigarettes. He has a 32.00 pack-year smoking history. He has never used smokeless tobacco. He reports current alcohol use. He reports that he does not use drugs.  He smoked 1 pack a day x 12 years.  He stopped smoking 30 years ago.  He drinks a glass of wine occasionally.  He is a Software engineer.  [place]  The patient is alone today.  Allergies:  Allergies  Allergen Reactions  . Penicillins Hives, Swelling and Other (See Comments)    SWELLING REACTION UNSPECIFIED PATIENT HAS TAKEN AMOXICILLIN ON MED HX FROM [facility] PATIENT HAS HAD A PCN REACTION WITH IMMEDIATE RASH, FACIAL/TONGUE/THROAT SWELLING, SOB, OR LIGHTHEADEDNESS WITH HYPOTENSION:  #  #  #  YES  #  #  #   Has patient had a PCN reaction causing severe rash involving mucus membranes or skin necrosis: No Has patient had a PCN reaction that required hospitalization No Has patient  had a PCN reaction occurring within the last 10 years: No  . Demerol [Meperidine] Hives and Nausea And Vomiting    Current Medications: Current Outpatient Medications  Medication Sig Dispense Refill  . acetaminophen (TYLENOL) 325 MG tablet Take 650 mg by mouth every 6 (six) hours as needed for moderate pain or headache.     Marland Kitchen amLODipine (NORVASC) 5 MG tablet Take 1 tablet (5 mg total) by mouth daily. 90 tablet 1  . aspirin 325 MG EC tablet Take 325 mg by mouth every evening.     Marland Kitchen aspirin 81 MG tablet Take 81 mg by mouth daily. Switched from 325mg  for procedure    . atorvastatin (LIPITOR) 40 MG tablet Take 1 tablet (40 mg total) by mouth daily. (Patient taking differently: Take 40 mg  by mouth at bedtime. ) 90 tablet 3  . cetirizine (ZYRTEC) 10 MG tablet Take 10 mg by mouth daily.    . cyclobenzaprine (FLEXERIL) 10 MG tablet Take 1 tablet (10 mg total) by mouth 2 (two) times daily as needed for muscle spasms. 30 tablet 0  . donepezil (ARICEPT) 5 MG tablet Take 5 mg by mouth at bedtime.     . fenofibrate 160 MG tablet TAKE ONE TABLET BY MOUTH EVERY DAY (Patient taking differently: Take 160 mg by mouth at bedtime. ) 30 tablet 3  . FLUoxetine (PROZAC) 20 MG capsule TAKE 1 CAPSULE (20MG ) BY MOUTH EVERY DAY (Patient taking differently: Take 20 mg by mouth daily. ) 90 capsule 1  . gabapentin (NEURONTIN) 400 MG capsule Take 400 mg by mouth 3 (three) times daily.     Marland Kitchen GAVILAX powder USE 1 CAPFUL IN 4-8 OUNCES OF LIQUID DAILY AS DIRECTED (Patient taking differently: Take 17 g by mouth every other day. ) 510 g 0  . HYDROcodone-acetaminophen (NORCO) 10-325 MG tablet Take 1 tablet by mouth every 6 (six) hours as needed for moderate pain. 20 tablet 0  . HYDROcodone-acetaminophen (NORCO) 5-325 MG tablet Take 1 tablet by mouth every 4 (four) hours as needed for moderate pain. 15 tablet 0  . hydrocortisone cream 1 % Apply 1 application topically daily as needed for itching.    . hydroxychloroquine (PLAQUENIL)  200 MG tablet Take 200 mg by mouth daily. Every other day    . lisinopril (PRINIVIL,ZESTRIL) 20 MG tablet TAKE ONE TABLET BY MOUTH EVERY EVENING (Patient taking differently: Take 20 mg by mouth every evening. ) 90 tablet 0  . mirabegron ER (MYRBETRIQ) 50 MG TB24 tablet Take 1 tablet (50 mg total) by mouth daily. 90 tablet 3  . omeprazole (PRILOSEC) 40 MG capsule TAKE 1 CAPSULE BY MOUTH DAILY (Patient taking differently: Take 40 mg by mouth every evening. ) 30 capsule 3  . testosterone cypionate (DEPOTESTOSTERONE CYPIONATE) 200 MG/ML injection INJECT 47mL INTO THE MUSCLE EVERY 14 DAYS (Patient taking differently: Inject 200 mg into the muscle every 14 (fourteen) days. ) 10 mL 0  . traZODone (DESYREL) 50 MG tablet Take 50 mg by mouth at bedtime.     No current facility-administered medications for this visit.     Review of Systems:  GENERAL:  Feels"tired and drug out".  No fevers, sweats.  Weight down 4 pounds. PERFORMANCE STATUS (ECOG):  2 HEENT:  No visual changes, runny nose, sore throat, mouth sores or tenderness. Lungs: Shortness of breath with exertion.  No cough.  No hemoptysis. Cardiac:  No chest pain, palpitations, orthopnea, or PND. GI:  Constipation.  No nausea, vomiting, diarrhea, melena or hematochezia. GU:  No urgency, frequency, dysuria, or hematuria.  Testosterone q 2 weeks. Musculoskeletal:  No back pain.  Rheumatoid arthritis.  No muscle tenderness. Extremities:  No pain or swelling. Skin:  No rashes or skin changes. Neuro:  No headache, numbness or weakness, balance or coordination issues. Endocrine:  No diabetes, thyroid issues, hot flashes or night sweats. Psych:  No mood changes, depression or anxiety. Pain:  Back pain (4 out of 10). Review of systems:  All other systems reviewed and found to be negative.   Physical Exam: There were no vitals taken for this visit. GENERAL:  Well developed, well nourished, gentleman sitting comfortably in the exam room in no acute  distress.  He has a cane by his side. MENTAL STATUS:  Alert and oriented to person, place and  time. HEAD:  [place] Punt and goatee.  Normocephalic, atraumatic, face symmetric, no Cushingoid features. EYES:  Hazel eyes.  Pupils equal round and reactive to light and accomodation.  No conjunctivitis or scleral icterus. ENT:  Oropharynx clear without lesion.  Tongue normal. Mucous membranes moist.  RESPIRATORY:  Clear to auscultation without rales, wheezes or rhonchi. CARDIOVASCULAR:  Regular rate and rhythm without murmur, rub or gallop. ABDOMEN:  Soft, non-tender, with active bowel sounds, and no hepatosplenomegaly.  No masses. SKIN:  No rashes, ulcers or lesions. EXTREMITIES: No edema, no skin discoloration or tenderness.  No palpable cords. LYMPH NODES: No palpable cervical, supraclavicular, axillary or inguinal adenopathy  NEUROLOGICAL: Unremarkable. PSYCH:  Appropriate.    No visits with results within 3 Day(s) from this visit.  Latest known visit with results is:  Office Visit on 04/12/2018  Component Date Value Ref Range Status  . Scan Result 04/12/2018 36   Final  . Prostate Specific Ag, Serum 04/12/2018 0.2  0.0 - 4.0 ng/mL Final   Comment: Roche ECLIA methodology. According to the American Urological Association, Serum PSA should decrease and remain at undetectable levels after radical prostatectomy. The AUA defines biochemical recurrence as an initial PSA value 0.2 ng/mL or greater followed by a subsequent confirmatory PSA value 0.2 ng/mL or greater. Values obtained with different assay methods or kits cannot be used interchangeably. Results cannot be interpreted as absolute evidence of the presence or absence of malignant disease.   . Testosterone 04/12/2018 34* 264 - 916 ng/dL Final   Comment: Adult male reference interval is based on a population of healthy nonobese males (BMI <30) between 71 and 7 years old. [place], [place] [phone]. PMID: [id number].      Assessment:  DAMEIAN CRISMAN is a 77 y.o. male with rheumatoid arthritis and progressive anemia over the past 2 years.  He has been on Plaquenil and hydralazine which can cause anemia.  Plaquenil was discontinued on 08/26/2016.  He denies any exposure to radiation or toxins.  He denies any prior history of hepatitis, prior transfusions or HIV risk factors.  He denies any herbal products.  Diet is good.  EGD on 09/24/2016 revealed patchy candidiasis in the entire esophagus.  There were two non-bleeding angioectasias in the duodenum treated with argon plasma coagulation.  Gastritis was biopsied.  Pathology revealed mild chronic gastritis negative for H pylori, dysplasia and malignancy.  Colonoscopy on 09/24/2016 revealed diverticulosis in the entire colon, one 3 mm polyp in the cecum, and non-bleeding internal hemorrhoids.  Pathology from the cecal polyp revealed a tubular adenoma negative for high grade dysplasia and malignancy.    He denies any melena or hematochezia. He denies any hematuria.  CBC on 06/02/2016 revealed a hematocrit of 29.5, hemoglobin 10.0, MCV 92.3, platelets 155,000, and WBC 4200.  Stool was guaiac negative x 2 in 06/2016.  Labs on 06/04/2016 revealed a ferritin of 124, iron saturation 22% and TIBC of 370.  B12 was 883 on 06/18/2014.  Work-up on 07/19/2016 revealed a hematocrit of 31.7, hemoglobin 10.6, MCV 91, platelets 143,000, white count 3000 with an ANC of 1800.  Absolute lymphocyte count was 700 (low). Creatinine was 1.57.  LDH was 269 (98-192).  Normal labs included:  uric acid, folate, [place], SPEP, and copper.  Iron studies included a saturation of 11% (low) and a TIBC of 454 (high) c/w iron deficiency anemia.  Sed rate was 19.  Retic was 0.9% (low).  He is on oral iron with vitamin C.  Chest, abdomen,  and pelvic CTon 08/09/2016 revealed no acute findings or clear evidence of malignancy in the chest, abdomen or pelvis. There was asymmetric left rectal wall  thickeningpossibly secondary to volume averaging with the prostate gland.  Bone marrow aspirate and biopsy on 11/04/2016 revealed a normocellular marrow (20%) with trilineage hematopoiesis and maturation.  There was mild megakaryocytic atypia.  Storage iron was present.  There were rare ringed sideroblasts.  Cytogenetics and FISH were normal (46, XY).  He was admitted to [facility] on 10/14/2016 - 10/18/2016 for spinal stenosis of the lumbar region.  He underwent redo decompressive lumbar laminotomy L5-S1 with radical foraminotomy the L5 and S1 nerve root with complete medial facetectomies. He underwent back surgery (revision fusion removal hardware and pedicle screw fixation) on 10/20/2017.  He was admitted to [facility] from 10/28/2017 - 10/31/2017 with symptomatic anemia.  He received 2 units of PRBCs.  Hemoglobin improved from 6.2 to 8.1.  Hospital work-up revealed the following normal labs: ferritin (65), B12 (401), TSH, SPEP, haptoglobin.  Testosterone was < 3 (low).  Iron saturation was 7% with a TIBC of 317.  LDH was 205 (98-192).  Retic was 2.6%.  Creatinine was 1.37 (CrCl 47.3 ml/min).  He receives testosterone injections every 2 weeks.  Zinc was 52 (low) on 01/20/2018 and 63 (normal) on 03/10/2018 on a MVI.  Peripheral smear revealed leukopenia with normal WBC morphology. Normocytic anemia with polychromasia, anisocytosis and rouleaux formation.   Work-up on 11/08/2017 revealed a hematocrit of 27.2, hemoglobin 9.0, platelets 200,000, WBC 5800 with an [place] of 4100.  Ferritin was 86.  Iron saturation was 6% with a TIBC of 382.  Sed rate was 56 (0-20).  Cold agglutinins were negative.  Reticulocyte count was 1.2%.  SPEP revealed no monoclonal protein on 10/31/2017.  He received weekly Venofer x 2 (11/25/2017 - 12/02/2017) and x 2 (12/23/2017 - 12/30/2017).  Ferritin has been followed: 106 on 06/24/2017, 65 on 10/30/2017, 86 on 11/08/2017, 124 on 12/12/2017, 106 on 12/15/2017, 95 on 01/20/2018,  and 133 on 03/10/2018.  Iron saturation was 6% on 06/24/2017 and 6% on 01/20/2018.  Sed rate was 56 on 11/08/2017 and 64 on 12/05/2017.  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  He has treated with Septra then switched to doxycycline.  He underwent back surgery on 10/20/2017.  Symptomatically,  he remains tired.  Exam is stable.  Platelet count is 117,000.  Plan: 1.  Labs today:  CBC with diff, ferritin, iron studies, sed rate.   2.  Iron deficiency anemia:  Hemoglobin 9.9.  Ferritin is 95.  Sed rate 34.  s/p Venofer x 2 trial secondary to elevated sed rate.  No additional Venofer. 3.  Low zinc level (mild):    Significance is unclear.  Usual oral intake of zinc is approximately 4 to 14 mg/day.    Dietary sources of zinc:  meat, seafood, and milk. Cereals contains the greatest amount of zinc.  Low levels can be found in Crohn's disease, liver disease, and renal disease.    Suggest MVI with zinc. 4.  Thrombocytopenia:  Discuss new thrombocytopenia.  No new medications or herbal products.  Check counts in 6 weeks.  If etiology remains unclear, may need to repeat bone marrow. 5.  RTC in 6 weeks for labs (CBC with diff, ferritin, iron studies, zinc). 6.  RTC in 3 months for MD assessment, labs (CBC with diff, ferritin, iron studies, sed rate- day before), and +/- Venofer.   Lequita Asal, MD  05/12/2018, 4:28 AM    I saw and evaluated the patient, participating in the key portions of the service and reviewing pertinent diagnostic studies and records.  I reviewed the nurse practitioner's note and agree with the findings and the plan.  The assessment and plan were discussed with the patient.  Additional diagnostic studies of *** are needed to clarify *** and would change the clinical management.  A few ***multiple questions were asked by the patient and answered.   Nolon Stalls, MD 05/12/2018,4:29 AM

## 2018-05-18 ENCOUNTER — Ambulatory Visit: Payer: Medicare Other | Admitting: General Surgery

## 2018-06-10 IMAGING — RF DG LUMBAR SPINE 2-3V
1 series · 2 of 2 positions shown · non-contrast
Comparison: None.

CLINICAL DATA: Lumbar fusion

EXAM:
LUMBAR SPINE - 2-3 VIEW; DG C-ARM 61-120 MIN

[Series 1: run · 2 of 2 slices shown]
[im 1/2]
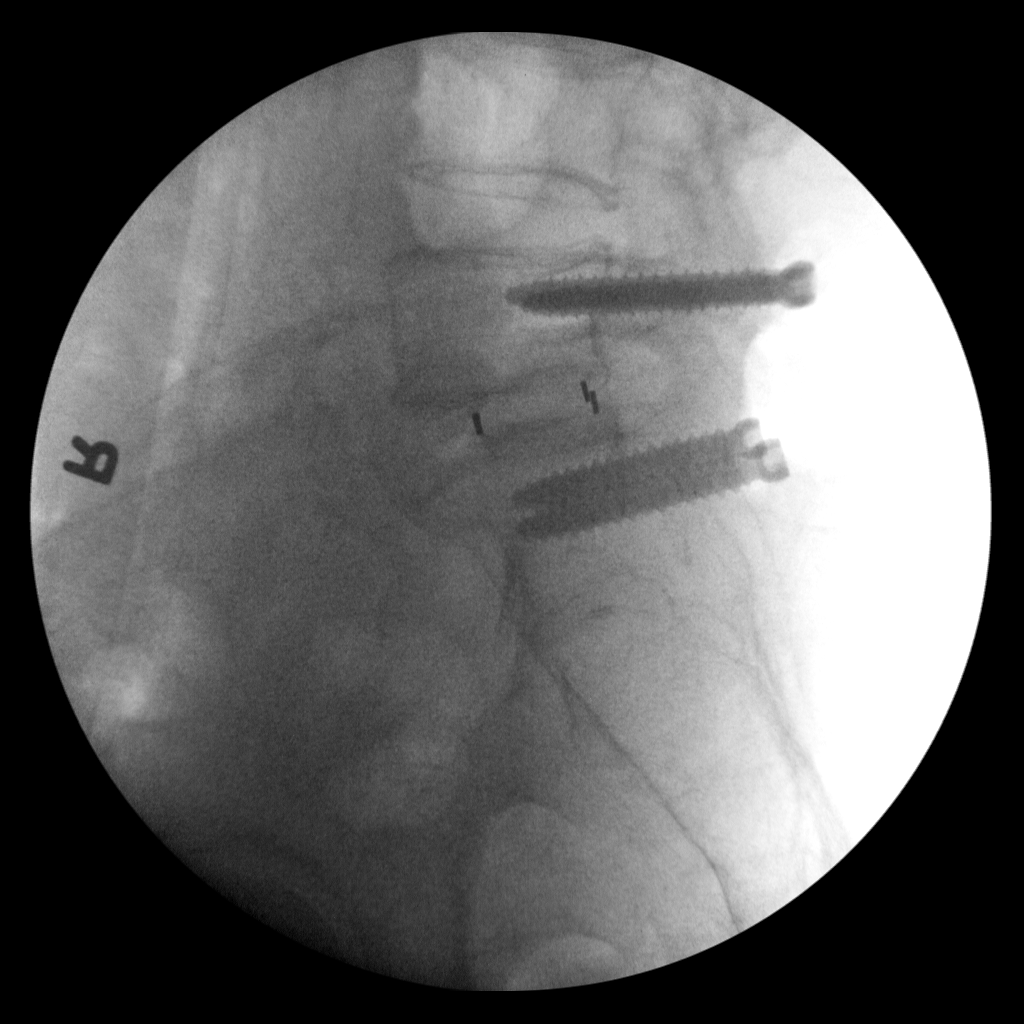
[im 2/2]
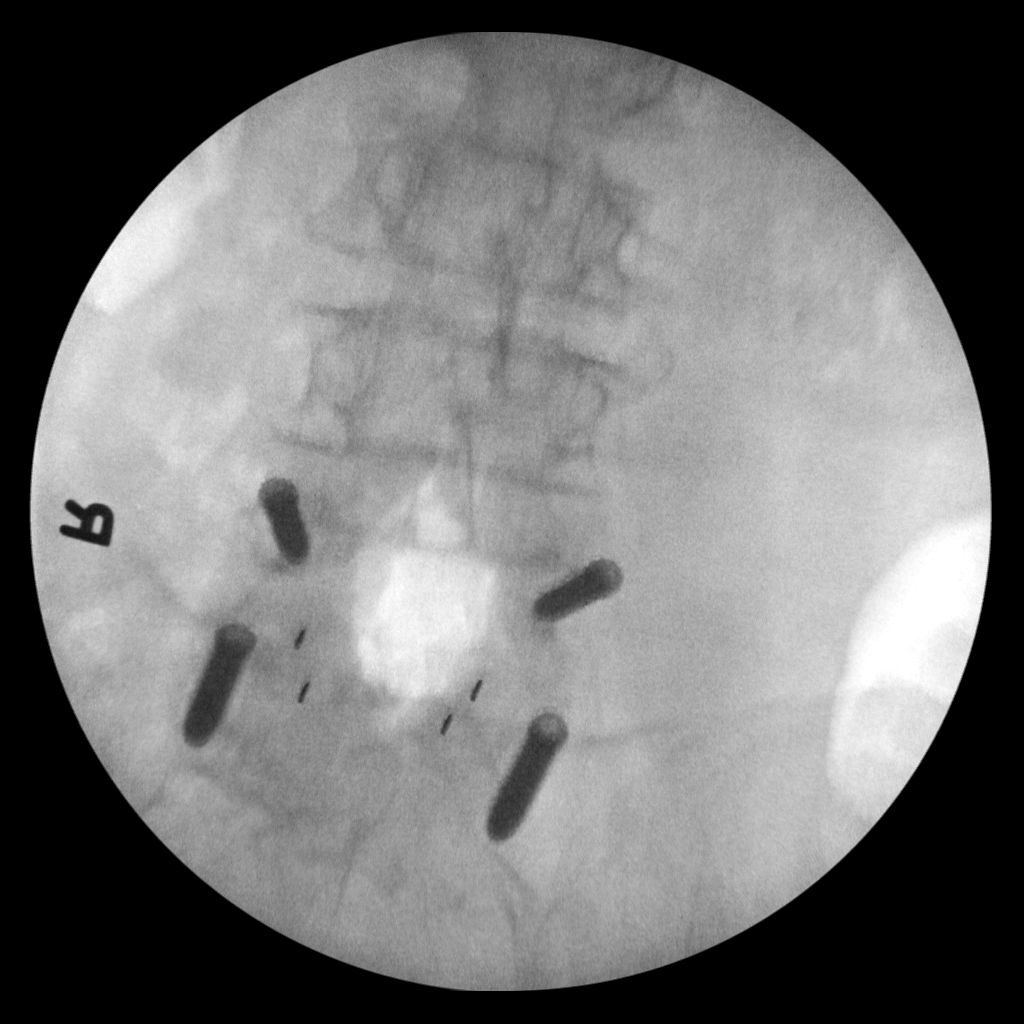

[2 of 2 positions shown; findings below may reference images not displayed]

FLUOROSCOPY TIME:  Fluoroscopy Time:  37 seconds

Radiation Exposure Index (if provided by the fluoroscopic device):
Not available

Number of Acquired Spot Images: 2
FINDINGS: Pedicle screws are noted at L5 and S1. Interbody fusion is noted. No
soft tissue abnormality is seen.
IMPRESSION: L5-S1 fusion

## 2018-06-11 IMAGING — CR DG CHEST 1V PORT
1 series · 1 of 1 positions shown · non-contrast
Comparison: CT chest 08/09/2016

CLINICAL DATA: Hypoxia

EXAM:
PORTABLE CHEST 1 VIEW

[AP]
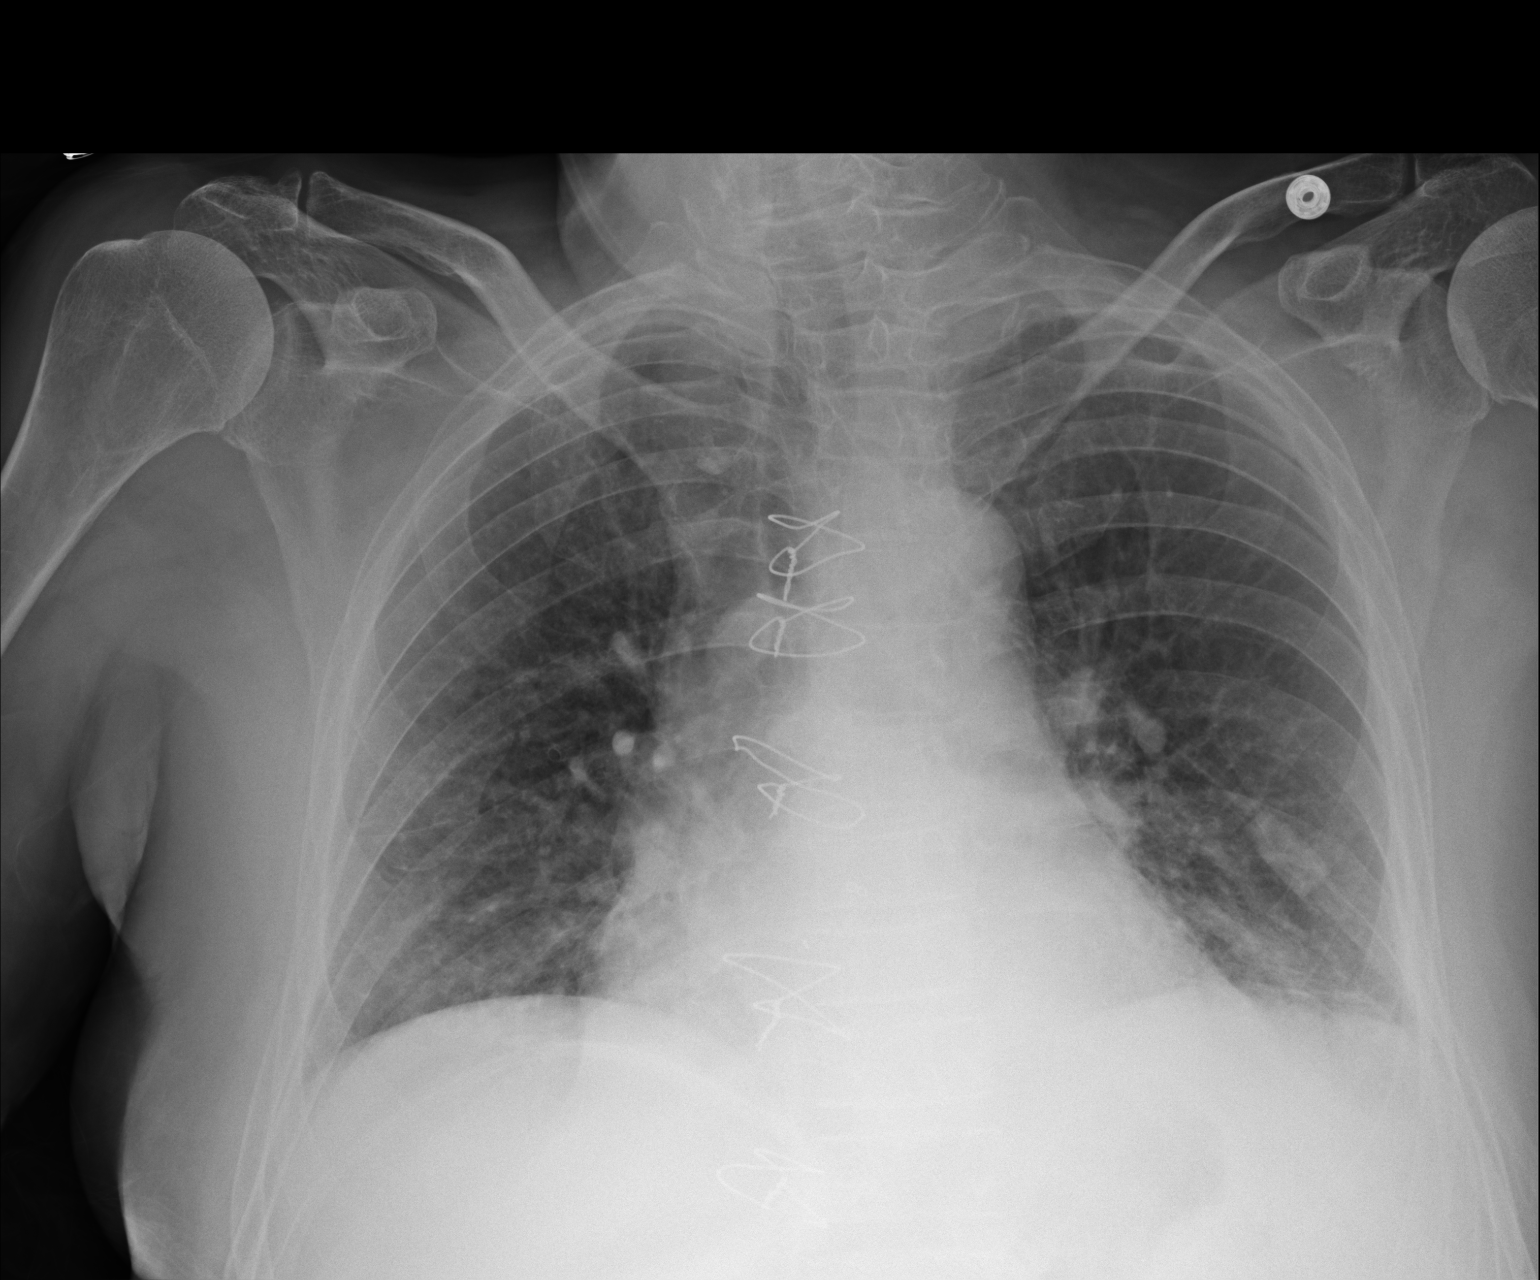

[1 of 1 positions shown; findings below may reference images not displayed]

FINDINGS: Postop median sternotomy and open heart surgery. Heart size within
normal limits. Negative for heart failure

Mild bibasilar atelectasis and scarring in the bases left greater
than right as noted on CT. No effusion.
IMPRESSION: Mild left lower lobe atelectasis and scarring. Negative for heart
failure

## 2018-06-28 ENCOUNTER — Other Ambulatory Visit: Payer: Self-pay | Admitting: Cardiovascular Disease

## 2018-07-01 IMAGING — CT CT BIOPSY
1 of 2 series · 12 of 16 positions shown, 15 images · non-contrast
Comparison: none

CLINICAL DATA: Pancytopenia and need for bone marrow biopsy.

[Series 2: i-spiral 5.0 b30f · axial · 0.76mm/px · z∈[-236,-127]mm · 12 of 37 slices shown, 15 images]
[im 3/37  soft-tissue]
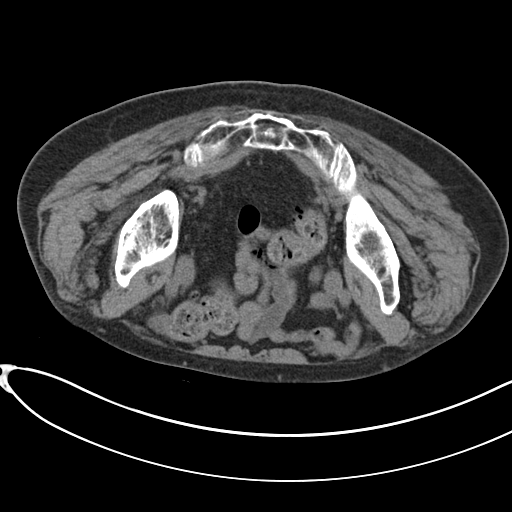
[im 3/37  bone]
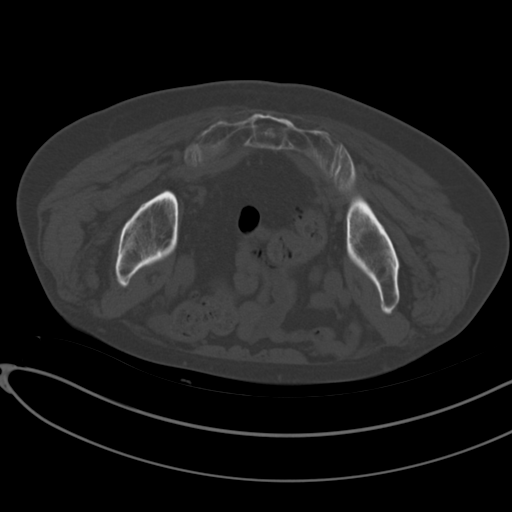
[im 6/37  soft-tissue]
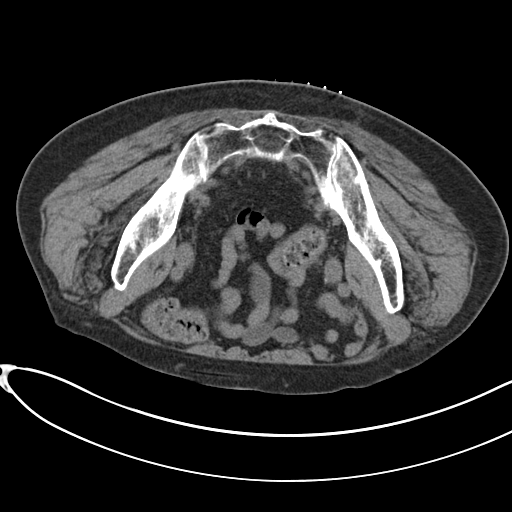
[im 9/37  soft-tissue]
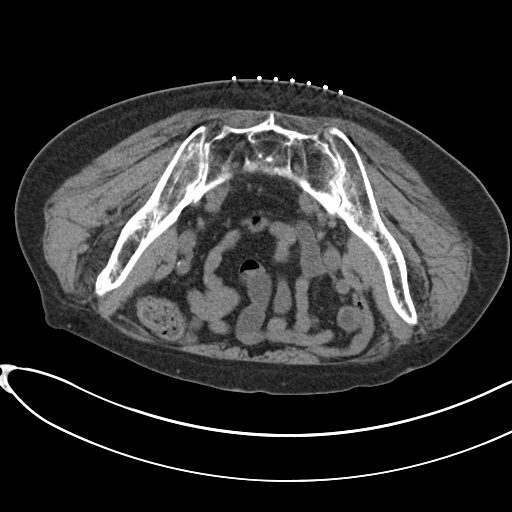
[im 12/37  soft-tissue]
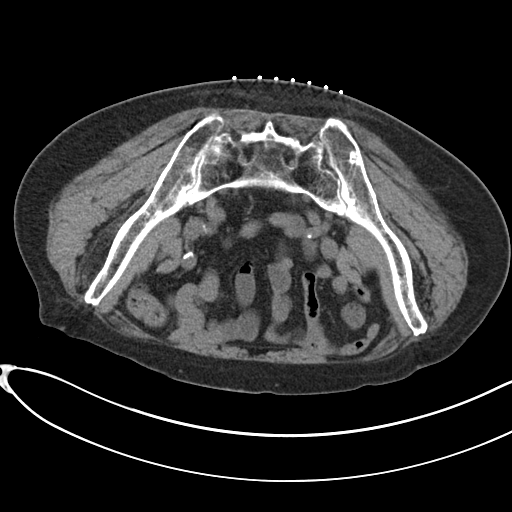
[im 14/37  soft-tissue]
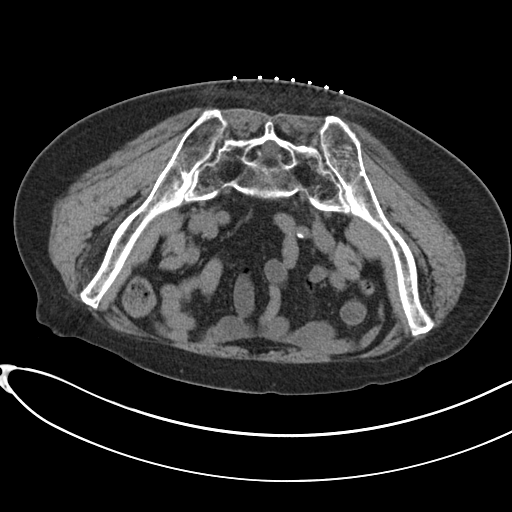
[im 14/37  bone]
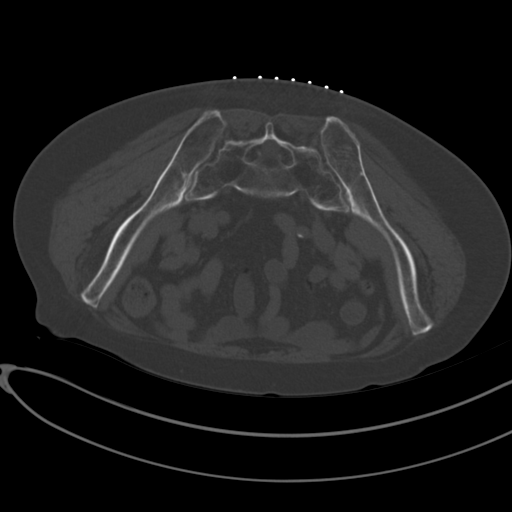
[im 17/37  soft-tissue]
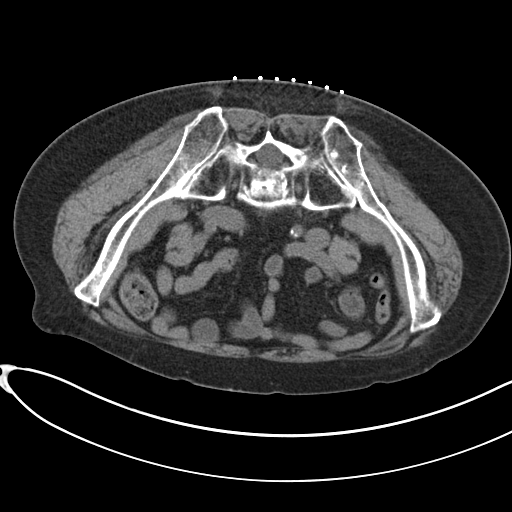
[im 20/37  soft-tissue]
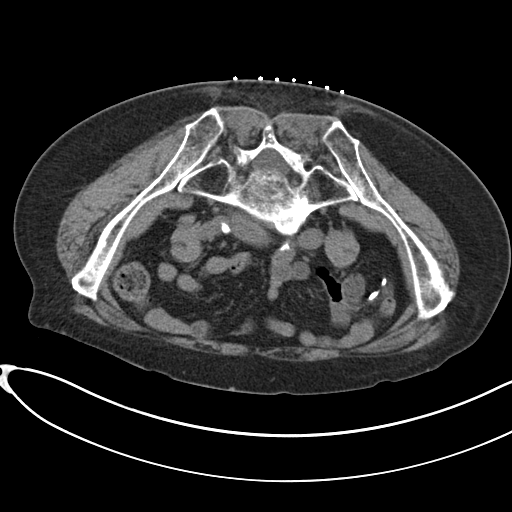
[im 23/37  soft-tissue]
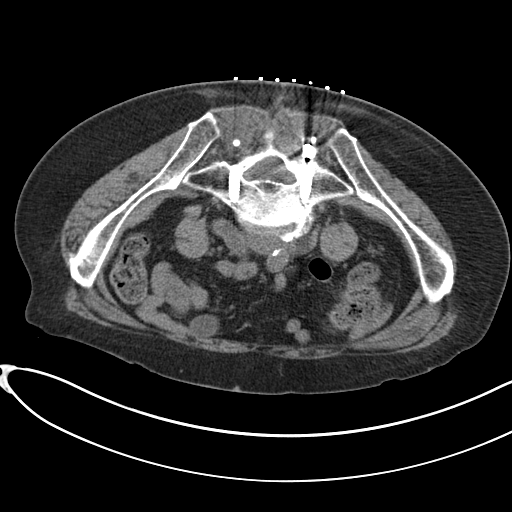
[im 25/37  soft-tissue]
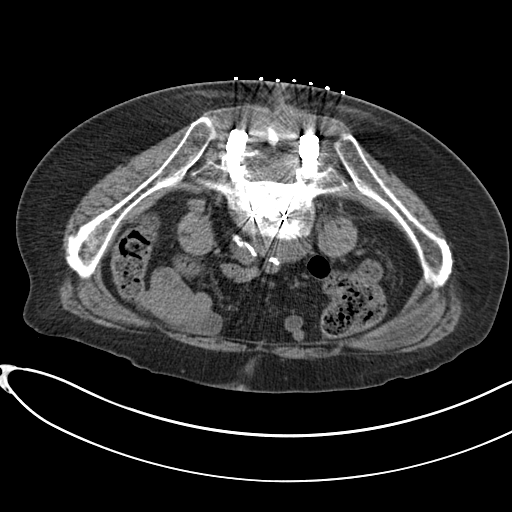
[im 25/37  bone]
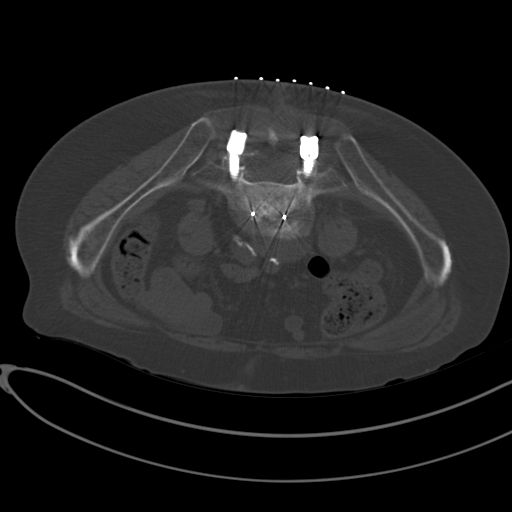
[im 28/37  soft-tissue]
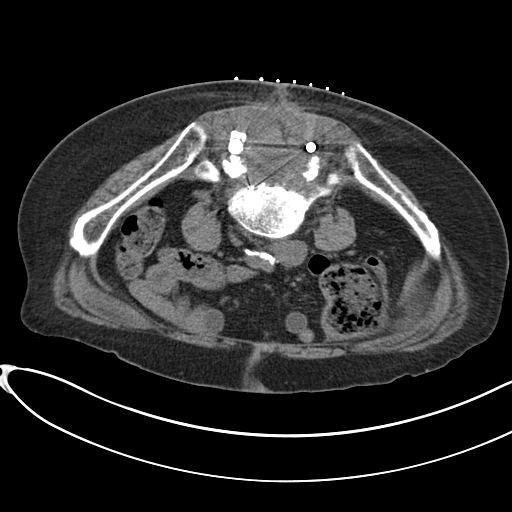
[im 31/37  soft-tissue]
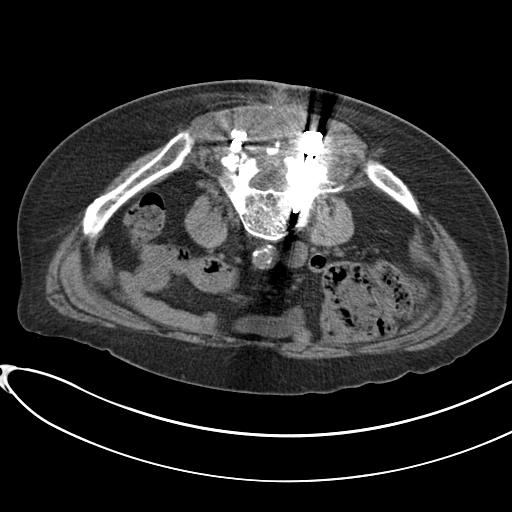
[im 34/37  soft-tissue]
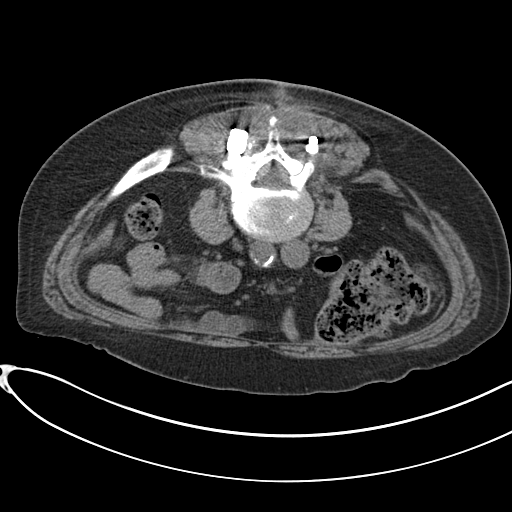

[12 of 16 positions shown; findings below may reference images not displayed]

EXAM:
CT GUIDED BONE MARROW ASPIRATION AND BIOPSY

ANESTHESIA/SEDATION:
Versed 2.0 mg IV, Fentanyl 50 mcg IV

Total Moderate Sedation Time:  18 minutes.

The patient's level of consciousness and physiologic status were
continuously monitored during the procedure by Radiology nursing.

PROCEDURE:
The procedure risks, benefits, and alternatives were explained to
the patient. Questions regarding the procedure were encouraged and
answered. The patient understands and consents to the procedure. A
time out was performed prior to initiating the procedure.

The left gluteal region was prepped with chlorhexidine. Sterile gown
and sterile gloves were used for the procedure. Local anesthesia was
provided with 1% Lidocaine.

Under CT guidance, an 11 gauge On Control bone cutting needle was
advanced from a posterior approach into the left iliac bone. Needle
positioning was confirmed with CT. Initial non heparinized and
heparinized aspirate samples were obtained of bone marrow. Core
biopsy was performed via the On Control drill needle. Two separate
core biopsy samples were obtained.

COMPLICATIONS:
None
FINDINGS: Inspection of initial aspirate did reveal visible particles. Intact
core biopsy samples were obtained.
IMPRESSION: CT guided bone marrow biopsy of left posterior iliac bone with both
aspirate and core samples obtained.

## 2018-07-04 ENCOUNTER — Other Ambulatory Visit: Payer: Self-pay | Admitting: Family Medicine

## 2018-07-06 ENCOUNTER — Telehealth: Payer: Self-pay

## 2018-07-06 NOTE — Telephone Encounter (Signed)
Sent to PCP ?

## 2018-07-06 NOTE — Telephone Encounter (Signed)
Copied from [place] [phone]. Topic: General - Other >> Jul 06, 2018  3:56 PM Mcneil, Ja-Kwan wrote: Reason for CRM: Pt stated his wife passed away and he would like Dr. Caryl Bis to give him a call. Pt stated he can be reached at either phone # listed on the chart.

## 2018-07-07 NOTE — Telephone Encounter (Signed)
Called and spoke with patient.  He noted he tried to get information regarding his wife passing away from the hospital though notes he was advised he was not on the form to be able to give information to.  It appears that there is a DPR form with his name on it.  I advised him that I wanted to make sure that this was adequate to give him information regarding this and would have to call him back on Monday when our office manager and front office lead are back in the office to determine if this is adequate to give him information.  I will forward to them as well.

## 2018-07-11 ENCOUNTER — Ambulatory Visit: Payer: Medicare Other | Admitting: General Surgery

## 2018-07-18 ENCOUNTER — Encounter: Payer: Self-pay | Admitting: Endocrinology

## 2018-07-18 ENCOUNTER — Ambulatory Visit (INDEPENDENT_AMBULATORY_CARE_PROVIDER_SITE_OTHER): Payer: Medicare Other | Admitting: Endocrinology

## 2018-07-18 ENCOUNTER — Other Ambulatory Visit: Payer: Self-pay

## 2018-07-18 DIAGNOSIS — M810 Age-related osteoporosis without current pathological fracture: Secondary | ICD-10-CM

## 2018-07-18 MED ORDER — OMEPRAZOLE 20 MG PO TBEC: 1.0000 | DELAYED_RELEASE_TABLET | Freq: Every day | ORAL | 11 refills | Status: DC

## 2018-07-18 MED ORDER — IBANDRONATE SODIUM 150 MG PO TABS: 150.0000 mg | ORAL_TABLET | ORAL | 3 refills | Status: DC

## 2018-07-18 NOTE — Progress Notes (Signed)
Subjective:    Patient ID: Bryan [name], male    DOB: [birth year], 78 y.o.   MRN: [id number]  HPI Pt returns for f/u of osteoporosis: Dx'ed: 2018 Secondary causes: prednisone (for RA), and low testosterone.  Fractures: spinal comp fxs.   Past rx: none Current rx: none Last DEXA result (2019): R hip T-score is -3.1. Other: GERD precludes Fosamax; he declined reclast.  Interval hx: he takes vit-D, uncertain dosage (1 non-rx pill per day).  Denies foot numbness.  Pt says he does not feel as though he needs the prilosec.  Past Medical History:  Diagnosis Date  . Abnormal CT scan    Asymmetric left rectal wall thickening   . Anemia   . Anxiety   . Arrhythmia   . Chronic lower back pain   . Complication of anesthesia    Memory loss 09/2015  . Coronary artery disease   . Depression   . GERD (gastroesophageal reflux disease)   . Glaucoma   . H/O thoracic aortic aneurysm repair   . Heart murmur   . History of being hospitalized    memory lose kidney funtion down blood pressure up  . History of blood transfusion    "think he had one when he had heart valve OR" (10/14/2016); "none since" (10/19/2017)  . History of chicken pox   . Hyperlipidemia   . Hypertension   . Lumbar stenosis   . Murmur   . Neuropathy   . Osteoarthritis    worse in feet and ankles  . Paroxysmal A-fib ([place])   . Poor short term memory    takes Aricept  . Rheumatoid arthritis ([place])    "all over" (10/14/2016)  . Schizophrenia ([place])   . Seasonal allergies   . Valvular heart disease     Past Surgical History:  Procedure Laterality Date  . AORTIC VALVE REPLACEMENT  2007   [facility].  Supply, [employer]; "pig valve"  . APPENDECTOMY  2010  . BACK SURGERY    . CARDIAC VALVE REPLACEMENT    . CATARACT EXTRACTION W/ INTRAOCULAR LENS  IMPLANT, BILATERAL Bilateral 2012  . COLONOSCOPY WITH PROPOFOL N/A 09/24/2016   Procedure: COLONOSCOPY WITH PROPOFOL;  Surgeon: Jonathon Bellows, MD;  Location: [facility] END[facility]COPY;   Service: Endoscopy;  Laterality: N/A;  . ESOPHAGOGASTRODUODENOSCOPY (EGD) WITH PROPOFOL N/A 09/24/2016   Procedure: ESOPHAGOGASTRODUODENOSCOPY (EGD) WITH PROPOFOL;  Surgeon: Jonathon Bellows, MD;  Location: [facility] ENDOSCOPY;  Service: Endoscopy;  Laterality: N/A;  . GIVENS CAPSULE STUDY N/A 09/24/2016   Procedure: GIVENS CAPSULE STUDY;  Surgeon: Jonathon Bellows, MD;  Location: [facility] ENDOSCOPY;  Service: Endoscopy;  Laterality: N/A;  . HAMMER TOE SURGERY Right 10/09/2015   Procedure: HAMMER TOE REPAIR WITH K-WIRE FIXATION RIGHT SECOND TOE;  Surgeon: Albertine Patricia, DPM;  Location: [place];  Service: Podiatry;  Laterality: Right;  WITH LOCAL  . INGUINAL HERNIA REPAIR Right 05/05/2018   Medium Bard PerFix plug.  Surgeon: Robert Bellow, MD;  Location: [facility]S;  Service: General;  Laterality: Right;  . JOINT REPLACEMENT Left    left total knee  . LAMINECTOMY WITH POSTERIOR LATERAL ARTHRODESIS LEVEL 3 N/A 09/28/2017   Procedure: LUMBAR  POSTEROR  FUSION REVISION - LUMBAR ONE -TWO, LUMBAR TWO -THREE,THREE-FOUR ,BILATERAL ARTHRODESIS REMOVAL LUMBAR THREE HARDWARE;  Surgeon: Kary Kos, MD;  Location: [place];  Service: Neurosurgery;  Laterality: N/A;  . LAMINECTOMY WITH POSTERIOR LATERAL ARTHRODESIS LEVEL 3 N/A 10/20/2017   Procedure: Revision fusion and removal of hardware Lumbar one, Pedicle  screw fixation thoracic ten-lumbar two, thoracic nine-ten laminotomy, Posterior Lumbar Arthrodesis thoracic ten-lumbar two ;  Surgeon: Kary Kos, MD;  Location: [place];  Service: Neurosurgery;  Laterality: N/A;  . LAPAROSCOPIC CHOLECYSTECTOMY  2010  . LUMBAR FUSION Right 06/08/2017   LUMBAR THREE-FOUR, LUMBAR FOUR-FIVE POSTEROLATERAL ARTHRODESIS WITH RIGHT LUMBAR FOUR-FIVE LAMINECTOMY/FORAMINOTOMY  . LUMBAR LAMINECTOMY/DECOMPRESSION MICRODISCECTOMY Right 03/08/2016   Procedure: Laminectomy and Foraminotomy - Lumbar Five-Sacral One Right;  Surgeon: Kary Kos, MD;  Location: [place];  Service: Neurosurgery;  Laterality:  Right;  Right  . MULTIPLE TOOTH EXTRACTIONS     "3"  . POSTERIOR LUMBAR FUSION  10/14/2016   L5-S1  . REPLACEMENT TOTAL KNEE Left 2003  . THORACIC AORTIC ANEURYSM REPAIR  2010    Social History   Socioeconomic History  . Marital status: Married    Spouse name: Not on file  . Number of children: Not on file  . Years of education: Not on file  . Highest education level: Not on file  Occupational History  . Occupation: retired  Scientific laboratory technician  . Financial resource strain: Not on file  . Food insecurity:    Worry: Not on file    Inability: Not on file  . Transportation needs:    Medical: Not on file    Non-medical: Not on file  Tobacco Use  . Smoking status: Former Smoker    Packs/day: 1.00    Years: 32.00    Pack years: 32.00    Types: Cigarettes    Last attempt to quit: 07/05/1981    Years since quitting: 37.0  . Smokeless tobacco: Never Used  Substance and Sexual Activity  . Alcohol use: Yes    Comment: 10/19/2017 "glass of wine/month; if that"  . Drug use: Never  . Sexual activity: Not Currently  Lifestyle  . Physical activity:    Days per week: Not on file    Minutes per session: Not on file  . Stress: Not on file  Relationships  . Social connections:    Talks on phone: Not on file    Gets together: Not on file    Attends religious service: Not on file    Active member of club or organization: Not on file    Attends meetings of clubs or organizations: Not on file    Relationship status: Not on file  . Intimate partner violence:    Fear of current or ex partner: Not on file    Emotionally abused: Not on file    Physically abused: Not on file    Forced sexual activity: Not on file  Other Topics Concern  . Not on file  Social History Narrative  . Not on file    Current Outpatient Medications on File Prior to Visit  Medication Sig Dispense Refill  . amLODipine (NORVASC) 5 MG tablet Take 1 tablet (5 mg total) by mouth daily. 90 tablet 1  . aspirin 325 MG EC  tablet Take 325 mg by mouth every evening.     Marland Kitchen atorvastatin (LIPITOR) 40 MG tablet TAKE ONE TABLET BY MOUTH EVERY DAY 90 tablet 3  . cetirizine (ZYRTEC) 10 MG tablet Take 10 mg by mouth daily.    Marland Kitchen donepezil (ARICEPT) 5 MG tablet Take 5 mg by mouth at bedtime.     . fenofibrate 160 MG tablet TAKE ONE TABLET BY MOUTH EVERY DAY (Patient taking differently: Take 160 mg by mouth at bedtime. ) 30 tablet 3  . FLUoxetine (PROZAC) 20 MG capsule TAKE 1  CAPSULE (20MG ) BY MOUTH EVERY DAY (Patient taking differently: Take 20 mg by mouth daily. ) 90 capsule 1  . gabapentin (NEURONTIN) 400 MG capsule Take 400 mg by mouth 3 (three) times daily.     Marland Kitchen GAVILAX powder USE 1 CAPFUL IN 4-8 OUNCES OF LIQUID DAILY AS DIRECTED (Patient taking differently: Take 17 g by mouth every other day. ) 510 g 0  . HYDROcodone-acetaminophen (NORCO) 10-325 MG tablet Take 1 tablet by mouth every 6 (six) hours as needed for moderate pain. 20 tablet 0  . HYDROcodone-acetaminophen (NORCO) 5-325 MG tablet Take 1 tablet by mouth every 4 (four) hours as needed for moderate pain. 15 tablet 0  . hydrocortisone cream 1 % Apply 1 application topically daily as needed for itching.    . hydroxychloroquine (PLAQUENIL) 200 MG tablet Take 200 mg by mouth daily. Every other day    . lisinopril (PRINIVIL,ZESTRIL) 20 MG tablet TAKE ONE TABLET EVERY EVENING 30 tablet 0  . testosterone cypionate (DEPOTESTOSTERONE CYPIONATE) 200 MG/ML injection INJECT 23mL INTO THE MUSCLE EVERY 14 DAYS (Patient taking differently: Inject 200 mg into the muscle every 14 (fourteen) days. ) 10 mL 0  . traZODone (DESYREL) 50 MG tablet Take 50 mg by mouth at bedtime.     No current facility-administered medications on file prior to visit.     Allergies  Allergen Reactions  . Penicillins Hives, Swelling and Other (See Comments)    SWELLING REACTION UNSPECIFIED PATIENT HAS TAKEN AMOXICILLIN ON MED HX FROM [facility] PATIENT HAS HAD A PCN REACTION WITH IMMEDIATE RASH,  FACIAL/TONGUE/THROAT SWELLING, SOB, OR LIGHTHEADEDNESS WITH HYPOTENSION:  #  #  #  YES  #  #  #   Has patient had a PCN reaction causing severe rash involving mucus membranes or skin necrosis: No Has patient had a PCN reaction that required hospitalization No Has patient had a PCN reaction occurring within the last 10 years: No  . Demerol [Meperidine] Hives and Nausea And Vomiting    Family History  Problem Relation Age of Onset  . Heart failure Mother   . Hypertension Mother   . Asthma Mother   . Osteoporosis Mother   . Heart attack Father 69       MI  . Hypertension Sister   . Prostate cancer Neg Hx   . Bladder Cancer Neg Hx   . Kidney cancer Neg Hx   . Colon cancer Neg Hx     BP 128/70 (BP Location: Right Arm, Patient Position: Sitting, Cuff Size: Normal)   Pulse 64   Ht 6' (1.829 m)   Wt 166 lb (75.3 kg)   SpO2 95%   BMI 22.51 kg/m    Review of Systems Denies falls.     Objective:   Physical Exam VITAL SIGNS:  See vs page GENERAL: no distress Gait: normal and steady.       Assessment & Plan:  GERD: we discussed.  Pt says this would not preclude bis-phosphonate Osteoporosis: I advised rx.  He agrees  Patient Instructions  I have sent 2 prescriptions to your pharmacy: to reduce the omeprazole, and for a once-a-month pill for the osteoporosis.    Take calcium 1200 mg per day, and vitamin-D, 1000 units per day Please come back for a follow-up appointment in 8 months.

## 2018-07-18 NOTE — Patient Instructions (Addendum)
I have sent 2 prescriptions to your pharmacy: to reduce the omeprazole, and for a once-a-month pill for the osteoporosis.    Take calcium 1200 mg per day, and vitamin-D, 1000 units per day Please come back for a follow-up appointment in 8 months.

## 2018-07-20 DIAGNOSIS — M810 Age-related osteoporosis without current pathological fracture: Secondary | ICD-10-CM

## 2018-07-25 ENCOUNTER — Ambulatory Visit: Payer: Medicare Other | Admitting: Urology

## 2018-07-25 DIAGNOSIS — I10 Essential (primary) hypertension: Secondary | ICD-10-CM | POA: Diagnosis not present

## 2018-07-25 DIAGNOSIS — M5440 Lumbago with sciatica, unspecified side: Secondary | ICD-10-CM | POA: Diagnosis not present

## 2018-08-01 NOTE — Progress Notes (Signed)
08/03/2018 8:56 AM   Bryan [name] 1940/07/05 [id number]  Referring provider: [name], MD [address] [place], [place] [address]  Chief Complaint  Patient presents with  . Follow-up    HPI: Bryan [name] is a 78 y.o. male Caucasian with testosterone deficiency, erectile dysfunction and BPH with LUTS who presents today for follow up.  He recently became a widower as his wife died July 03, 2018.  BPH WITH LUTS  (prostate and/or bladder) IPSS score: 14/3  PVR: 36 mL  Previous score: 8/2  Previous PVR: 36 mL  Major complaint(s): Frequency, nocturia and incontinence.  Denies any dysuria, hematuria or suprapubic pain.   Currently taking: Nothing; he found the Myrbetriq ineffective and stopped taking it at the end of 05/2018.  His has had a laser bladder outlet procedure in 05/05/2013.  The patient is experiencing urgency x 0-3, frequency x 4-7, not restricting fluids to avoid visits to the restroom 0-3, is engaging in toilet mapping, incontinence x 0-3 and nocturia x 0-3.  His BP is 158/82.  Denies any recent fevers, chills, nausea or vomiting.  He does not have a family history of PCa.  IPSS    Row Name 08/03/18 1500         International Prostate Symptom Score   How often have you had the sensation of not emptying your bladder?  About half the time     How often have you had to urinate less than every two hours?  Less than half the time     How often have you found you stopped and started again several times when you urinated?  Less than half the time     How often have you found it difficult to postpone urination?  Less than half the time     How often have you had a weak urinary stream?  Less than half the time     How often have you had to strain to start urination?  Less than 1 in 5 times     How many times did you typically get up at night to urinate?  2 Times     Total IPSS Score  14       Quality of Life due to urinary symptoms   If  you were to spend the rest of your life with your urinary condition just the way it is now how would you feel about that?  Mixed       Score:  1-7 Mild 8-19 Moderate 20-35 Severe  Testosterone deficiency Patient was advised on 04/12/2018 to continue his testosterone therapy as he is anemic and has osteoporosis.  He is currently having 1 cc of 200mg /cc of testosterone cypionate injected by [facility] every 14 days.  PMH: Past Medical History:  Diagnosis Date  . Abnormal CT scan    Asymmetric left rectal wall thickening   . Anemia   . Anxiety   . Arrhythmia   . Chronic lower back pain   . Complication of anesthesia    Memory loss 09/2015  . Coronary artery disease   . Depression   . GERD (gastroesophageal reflux disease)   . Glaucoma   . H/O thoracic aortic aneurysm repair   . Heart murmur   . History of being hospitalized    memory lose kidney funtion down blood pressure up  . History of blood transfusion    "think he had one when he had heart valve OR" (10/14/2016); "none since" (10/19/2017)  .  History of chicken pox   . Hyperlipidemia   . Hypertension   . Lumbar stenosis   . Murmur   . Neuropathy   . Osteoarthritis    worse in feet and ankles  . Paroxysmal A-fib ([place])   . Poor short term memory    takes Aricept  . Rheumatoid arthritis ([place])    "all over" (10/14/2016)  . Schizophrenia ([place])   . Seasonal allergies   . Valvular heart disease     Surgical History: Past Surgical History:  Procedure Laterality Date  . AORTIC VALVE REPLACEMENT  2007   [facility].  Supply, [place]; "pig valve"  . APPENDECTOMY  2010  . BACK SURGERY    . CARDIAC VALVE REPLACEMENT    . CATARACT EXTRACTION W/ INTRAOCULAR LENS  IMPLANT, BILATERAL Bilateral 2012  . COLONOSCOPY WITH PROPOFOL N/A 09/24/2016   Procedure: COLONOSCOPY WITH PROPOFOL;  Surgeon: Jonathon Bellows, MD;  Location: [facility] END[facility]COPY;  Service: Endoscopy;  Laterality: N/A;  . ESOPHAGOGASTRODUODENOSCOPY (EGD) WITH  PROPOFOL N/A 09/24/2016   Procedure: ESOPHAGOGASTRODUODENOSCOPY (EGD) WITH PROPOFOL;  Surgeon: Jonathon Bellows, MD;  Location[facility] ENDOSCOPY;  Service: Endoscopy;  Laterality: N/A;  . GIVENS CAPSULE STUDY N/A 09/24/2016   Procedure: GIVENS CAPSULE STUDY;  Surgeon: Jonathon Bellows, MD;  Location: [facility] ENDOSCOPY;  Service: Endoscopy;  Laterality: N/A;  . HAMMER TOE SURGERY Right 10/09/2015   Procedure: HAMMER TOE REPAIR WITH K-WIRE FIXATION RIGHT SECOND TOE;  Surgeon: Albertine Patricia, DPM;  Location: [place];  Service: Podiatry;  Laterality: Right;  WITH LOCAL  . INGUINAL HERNIA REPAIR Right 05/05/2018   Medium Bard PerFix plug.  Surgeon: Robert Bellow, MD;  Location: [facility] ORS;  Service: General;  Laterality: Right;  . JOINT REPLACEMENT Left    left total knee  . LAMINECTOMY WITH POSTERIOR LATERAL ARTHRODESIS LEVEL 3 N/A 09/28/2017   Procedure: LUMBAR  POSTEROR  FUSION REVISION - LUMBAR ONE -TWO, LUMBAR TWO -THREE,THREE-FOUR ,BILATERAL ARTHRODESIS REMOVAL LUMBAR THREE HARDWARE;  Surgeon: Kary Kos, MD;  Location: [place];  Service: Neurosurgery;  Laterality: N/A;  . LAMINECTOMY WITH POSTERIOR LATERAL ARTHRODESIS LEVEL 3 N/A 10/20/2017   Procedure: Revision fusion and removal of hardware Lumbar one, Pedicle screw fixation thoracic ten-lumbar two, thoracic nine-ten laminotomy, Posterior Lumbar Arthrodesis thoracic ten-lumbar two ;  Surgeon: Kary Kos, MD;  Location: [place];  Service: Neurosurgery;  Laterality: N/A;  . LAPAROSCOPIC CHOLECYSTECTOMY  2010  . LUMBAR FUSION Right 06/08/2017   LUMBAR THREE-FOUR, LUMBAR FOUR-FIVE POSTEROLATERAL ARTHRODESIS WITH RIGHT LUMBAR FOUR-FIVE LAMINECTOMY/FORAMINOTOMY  . LUMBAR LAMINECTOMY/DECOMPRESSION MICRODISCECTOMY Right 03/08/2016   Procedure: Laminectomy and Foraminotomy - Lumbar Five-Sacral One Right;  Surgeon: Kary Kos, MD;  Location: [place];  Service: Neurosurgery;  Laterality: Right;  Right  . MULTIPLE TOOTH EXTRACTIONS     "3"  . POSTERIOR LUMBAR FUSION   10/14/2016   L5-S1  . REPLACEMENT TOTAL KNEE Left 2003  . THORACIC AORTIC ANEURYSM REPAIR  2010    Home Medications:  Allergies as of 08/03/2018      Reactions   Penicillins Hives, Swelling, Other (See Comments)   SWELLING REACTION UNSPECIFIED PATIENT HAS TAKEN AMOXICILLIN ON MED HX FROM [facility] PATIENT HAS HAD A PCN REACTION WITH IMMEDIATE RASH, FACIAL/TONGUE/THROAT SWELLING, SOB, OR LIGHTHEADEDNESS WITH HYPOTENSION:  #  #  #  YES  #  #  #   Has patient had a PCN reaction causing severe rash involving mucus membranes or skin necrosis: No Has patient had a PCN reaction that required hospitalization No Has patient  had a PCN reaction occurring within the last 10 years: No   Demerol [meperidine] Hives, Nausea And Vomiting      Medication List       Accurate as of August 03, 2018 11:59 PM. Always use your most recent med list.        amLODipine 5 MG tablet Commonly known as:  NORVASC Take 1 tablet (5 mg total) by mouth daily.   aspirin 325 MG EC tablet Take 325 mg by mouth every evening.   atorvastatin 40 MG tablet Commonly known as:  LIPITOR TAKE ONE TABLET BY MOUTH EVERY DAY   cetirizine 10 MG tablet Commonly known as:  ZYRTEC Take 10 mg by mouth daily.   donepezil 5 MG tablet Commonly known as:  ARICEPT Take 5 mg by mouth at bedtime.   fenofibrate 160 MG tablet TAKE ONE TABLET BY MOUTH EVERY DAY   FLUoxetine 20 MG capsule Commonly known as:  PROZAC TAKE 1 CAPSULE (20MG ) BY MOUTH EVERY DAY   gabapentin 400 MG capsule Commonly known as:  NEURONTIN Take 400 mg by mouth 3 (three) times daily.   GaviLAX powder Generic drug:  polyethylene glycol powder USE 1 CAPFUL IN 4-8 OUNCES OF LIQUID DAILY AS DIRECTED   HYDROcodone-acetaminophen 10-325 MG tablet Commonly known as:  NORCO Take 1 tablet by mouth every 6 (six) hours as needed for moderate pain.   HYDROcodone-acetaminophen 5-325 MG tablet Commonly known as:  Norco Take 1 tablet by mouth every 4 (four) hours as  needed for moderate pain.   hydrocortisone cream 1 % Apply 1 application topically daily as needed for itching.   hydroxychloroquine 200 MG tablet Commonly known as:  PLAQUENIL Take 200 mg by mouth daily. Every other day   ibandronate 150 MG tablet Commonly known as:  BONIVA Take 1 tablet (150 mg total) by mouth every 30 (thirty) days. Take with a glass of water, on an empty stomach.  Stay upright for 30 min.   lisinopril 20 MG tablet Commonly known as:  PRINIVIL,ZESTRIL TAKE ONE TABLET EVERY EVENING   Omeprazole 20 MG Tbec Take 1 tablet (20 mg total) by mouth daily.   testosterone cypionate 200 MG/ML injection Commonly known as:  DEPOTESTOSTERONE CYPIONATE INJECT 71mL INTO THE MUSCLE EVERY 14 DAYS   traZODone 50 MG tablet Commonly known as:  DESYREL Take 50 mg by mouth at bedtime.       Allergies:  Allergies  Allergen Reactions  . Penicillins Hives, Swelling and Other (See Comments)    SWELLING REACTION UNSPECIFIED PATIENT HAS TAKEN AMOXICILLIN ON MED HX FROM [facility] PATIENT HAS HAD A PCN REACTION WITH IMMEDIATE RASH, FACIAL/TONGUE/THROAT SWELLING, SOB, OR LIGHTHEADEDNESS WITH HYPOTENSION:  #  #  #  YES  #  #  #   Has patient had a PCN reaction causing severe rash involving mucus membranes or skin necrosis: No Has patient had a PCN reaction that required hospitalization No Has patient had a PCN reaction occurring within the last 10 years: No  . Demerol [Meperidine] Hives and Nausea And Vomiting    Family History: Family History  Problem Relation Age of Onset  . Heart failure Mother   . Hypertension Mother   . Asthma Mother   . Osteoporosis Mother   . Heart attack Father 30       MI  . Hypertension Sister   . Prostate cancer Neg Hx   . Bladder Cancer Neg Hx   . Kidney cancer Neg Hx   . Colon cancer  Neg Hx     Social History:  reports that he quit smoking about 37 years ago. His smoking use included cigarettes. He has a 32.00 pack-year smoking history. He has  never used smokeless tobacco. He reports current alcohol use. He reports that he does not use drugs.  ROS: UROLOGY Frequent Urination?: Yes Hard to postpone urination?: No Burning/pain with urination?: No Get up at night to urinate?: Yes Leakage of urine?: No Urine stream starts and stops?: No Trouble starting stream?: No Do you have to strain to urinate?: No Blood in urine?: No Urinary tract infection?: No Sexually transmitted disease?: No Injury to kidneys or bladder?: No Painful intercourse?: No Weak stream?: No Erection problems?: No Penile pain?: No  Gastrointestinal Nausea?: No Vomiting?: No Indigestion/heartburn?: No Diarrhea?: No Constipation?: No  Constitutional Fever: No Night sweats?: No Weight loss?: Yes Fatigue?: Yes  Skin Skin rash/lesions?: No Itching?: Yes  Eyes Blurred vision?: No Double vision?: No  Ears/Nose/Throat Sore throat?: No Sinus problems?: Yes  Hematologic/Lymphatic Swollen glands?: No Easy bruising?: No  Cardiovascular Leg swelling?: No Chest pain?: No  Respiratory Cough?: No Shortness of breath?: No  Endocrine Excessive thirst?: No  Musculoskeletal Back pain?: Yes Joint pain?: Yes  Neurological Headaches?: No Dizziness?: No  Psychologic Depression?: No Anxiety?: Yes  Physical Exam: BP (!) 158/82   Pulse 71   Ht 6' (1.829 m)   Wt 172 lb 3.2 oz (78.1 kg)   SpO2 98%   BMI 23.35 kg/m   Constitutional:  Well nourished. Alert and oriented, No acute distress. HEENT: [place] AT, moist mucus membranes.  Trachea midline. Cardiovascular: No clubbing, cyanosis, or edema. Respiratory: Normal respiratory effort, no increased work of breathing. GU: No CVA tenderness.  No bladder fullness or masses.  Patient with uncircumcised phallus.  Foreskin easily retracted.  Urethral meatus is patent.  No penile discharge. No penile lesions or rashes. Scrotum without lesions, cysts, rashes and/or edema.  Testicles are located  scrotally bilaterally. No masses are appreciated in the testicles. Left and right epididymis are normal. Rectal: Patient with normal sphincter tone.  Anus and perineum without scarring or rashes.  No rectal masses are appreciated. Prostate is approximately 25 grams, no nodules are appreciated.  Seminal vesicles are normal. Skin: No rashes, bruises or suspicious lesions. Lymph: No cervical or inguinal adenopathy. Neurologic: Grossly intact, no focal deficits, moving all 4 extremities. Psychiatric: Normal mood and affect.  Laboratory Data: Lab Results  Component Value Date   WBC 4.1 03/10/2018   HGB 12.3 (L) 08/03/2018   HCT 37.4 (L) 08/03/2018   MCV 86.2 03/10/2018   PLT 110 (L) 03/10/2018    Lab Results  Component Value Date   CREATININE 1.19 12/15/2017   PSA history:  <0.1 ng/mL on 01/17/2014    0.2 ng/mL on 08/07/2014    0.3 ng/mL on 02/17/2015    0.2 ng/mL on 07/09/2015  <0.2 ng/mL on 01/03/2017  <0.01 ng/mL on 09/09/2016  <0.1 ng/mL on 01/03/2018  0.2 ng/mL on 04/12/2018  Lab Results  Component Value Date   PSA <0.01 09/09/2016   Lab Results  Component Value Date   TESTOSTERONE >1500 (H) 08/03/2018   Lab Results  Component Value Date   TSH 1.598 10/31/2017   Lab Results  Component Value Date   AST 23 06/27/2017   Lab Results  Component Value Date   ALT 14 (L) 06/27/2017   Urinalysis    Component Value Date/Time   COLORURINE STRAW (A) 10/28/2017 2350   APPEARANCEUR CLEAR (A)  10/28/2017 2350   APPEARANCEUR Clear 03/28/2014 1145   LABSPEC 1.006 10/28/2017 2350   LABSPEC 1.016 03/28/2014 1145   PHURINE 5.0 10/28/2017 2350   GLUCOSEU NEGATIVE 10/28/2017 2350   GLUCOSEU Negative 03/28/2014 1145   HGBUR NEGATIVE 10/28/2017 2350   BILIRUBINUR NEGATIVE 10/28/2017 2350   BILIRUBINUR Small 11/21/2015 1620   BILIRUBINUR Negative 03/28/2014 1145   KETONESUR NEGATIVE 10/28/2017 2350   PROTEINUR NEGATIVE 10/28/2017 2350   UROBILINOGEN 0.2 11/21/2015 1620    NITRITE NEGATIVE 10/28/2017 2350   LEUKOCYTESUR NEGATIVE 10/28/2017 2350   LEUKOCYTESUR Negative 03/28/2014 1145   I have reviewed the labs.  Assessment & Plan:    1. LUTS - IPSS score is 14/3, it is worsening - Continue conservative management, avoiding bladder irritants and timed voiding's - Most bothersome symptoms is/are urge incontinence - Discontinue tamsulosin 0.4 mg  - RTC in 6 months for I PSS and PVR   2. Urge incontinence  - Patient has discontinued Myrbetriq having found it ineffective - Explained to the patient that an alternate medication is oxybutynin; explained how it works and its side effects.  Patient declined to pursue. - Explained to the patient PTNS and he would like to think about this therapy - RTC in 6 months for I PSS and PVR  3. Testosterone deficiency - Testosterone cypionate refills provided - testosterone level, HCT and Hgb were drawn today - if stable will continue present dose of 200 mg/IM, 1 cc weekly   Return for pending labs .  Zara Council, PA-C  [facility] [address] [place], [place] [address] [phone]  I, Adele Schilder, am acting as a Education administrator for [employer], PA-C.   I have reviewed the above documentation for accuracy and completeness, and I agree with the above.    Zara Council, PA-C

## 2018-08-03 ENCOUNTER — Ambulatory Visit (INDEPENDENT_AMBULATORY_CARE_PROVIDER_SITE_OTHER): Payer: Medicare Other | Admitting: Urology

## 2018-08-03 ENCOUNTER — Other Ambulatory Visit: Payer: Self-pay

## 2018-08-03 ENCOUNTER — Encounter: Payer: Self-pay | Admitting: Urology

## 2018-08-03 VITALS — BP 158/82 | HR 71 | Ht 72.0 in | Wt 172.2 lb

## 2018-08-03 DIAGNOSIS — N3941 Urge incontinence: Secondary | ICD-10-CM | POA: Diagnosis not present

## 2018-08-03 DIAGNOSIS — R399 Unspecified symptoms and signs involving the genitourinary system: Secondary | ICD-10-CM

## 2018-08-03 DIAGNOSIS — E291 Testicular hypofunction: Secondary | ICD-10-CM | POA: Diagnosis not present

## 2018-08-04 ENCOUNTER — Telehealth: Payer: Self-pay

## 2018-08-04 DIAGNOSIS — E291 Testicular hypofunction: Secondary | ICD-10-CM

## 2018-08-04 LAB — TESTOSTERONE: Testosterone: >1500 ng/dL — ABNORMAL HIGH (ref 264–916)

## 2018-08-04 LAB — HEMOGLOBIN: Hemoglobin: 12.3 g/dL — ABNORMAL LOW (ref 13.0–17.7)

## 2018-08-04 LAB — HEMATOCRIT: Hematocrit: 37.4 % — ABNORMAL LOW (ref 37.5–51.0)

## 2018-08-04 NOTE — Telephone Encounter (Signed)
Called pt no answer. Unable to leave message as no voicemail is set up.  

## 2018-08-04 NOTE — Progress Notes (Incomplete)
Cardiology Office Note  Date:  08/07/2018   ID:  Bryan [name], DOB 12/28/1940, MRN [id number]  PCP:  [name], MD   No chief complaint on file.   HPI:  Bryan [name] is a pleasant 78 y.o. 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  ***  Blood pressure ***/*** CR 1.19 Glucose 124   EKG personally reviewed by myself on todays visit Shows *** rhythm. *** bpm. ***  {Lab work reviewed with him HCT 32, down from 85 Seen by hemaology, workup negative, started on iron, constipation Was given iron infusion  Mild night sweats , little better, has not resolved Weight loss , down another 5 pounds since 10/2016, has good appetite Big drop in weight 10/2015 to 04/2016, 20 pounds  EKG personally reviewed by myself on todays visit Shows sinus bradycardia rate 66 bpm with second-degree AV block type I}  OTHER PAST MEDICAL HISTORY REVIEWED BY ME FOR TODAY'S VISIT: Back surgery  5//18 Another back surgery, leg pain, Fx of L4, [place]  Another back surgery more recently Performed for continued leg pain, underwent lumbar fusion  Echo 11/2015: Left ventricle:  The estimated ejection fraction was in the  range of 50% to 55%.  - Aortic valve: A bioprosthesis was present. There was mild stenosis. Mean gradient (S): 20 mm Hg. Valve area (VTI): 1.24 cm^2. - Aortic root: The aortic root was mild to moderately  dilated.44 mm (ED). Ascending aorta mildly dilated - Pulmonary arteries:moderately increased.PA peak pressure: 53 mm Hg (S).   foot surgery 10/13/2015, postoperative delirium, creatinine 1.9 well above his baseline October 2017 had recent back surgery lumbar spine   Echocardiogram June 2016 discussed with him showing normal LV function, valve gradient consistent with bioprosthetic valve, moderately dilated ascending aorta, stable, 4.4 cm  Echocardiogram July 2015 showing mild to moderate aortic valve stenosis, 4.5 cm ascending aorta. No prior records available  Notes indicate some degree of carotid disease though details are not available.  PMH:   has a past medical history of Abnormal CT scan, Anemia, Anxiety, Arrhythmia, Chronic lower back pain, Complication of anesthesia, Coronary artery disease, Depression, GERD (gastroesophageal reflux disease), Glaucoma, H/O thoracic aortic aneurysm repair, Heart murmur, History of being hospitalized, History of blood transfusion, History of chicken pox, Hyperlipidemia, Hypertension, Lumbar stenosis, Murmur, Neuropathy, Osteoarthritis, Paroxysmal A-fib ([place]), Poor short term memory, Rheumatoid arthritis ([place]), Schizophrenia ([place]), Seasonal allergies, and Valvular heart disease.  PSH:    Past Surgical History:  Procedure Laterality Date   AORTIC VALVE REPLACEMENT  2007   [facility].  [place], [place]; "pig valve"   APPENDECTOMY  2010   BACK SURGERY     CARDIAC VALVE REPLACEMENT     CATARACT EXTRACTION W/ INTRAOCULAR LENS  IMPLANT, BILATERAL Bilateral 2012   COLONOSCOPY WITH PROPOFOL N/A 09/24/2016   Procedure: COLONOSCOPY WITH PROPOFOL;  Surgeon: Jonathon Bellows, MD;  Location: [facility] ENDOSCOPY;  Service: Endoscopy;  Laterality: N/A;   ESOPHAGOGASTRODUODENOSCOPY (EGD) WITH PROPOFOL N/A  09/24/2016   Procedure: ESOPHAGOGASTRODUODENOSCOPY (EGD) WITH PROPOFOL;  Surgeon: Jonathon Bellows, MD;  Location[facility] ENDOSCOPY;  Service: Endoscopy;  Laterality: N/A;    GIVENS CAPSULE STUDY N/A 09/24/2016   Procedure: GIVENS CAPSULE STUDY;  Surgeon: Jonathon Bellows, MD;  Location: [facility] ENDOSCOPY;  Service: Endoscopy;  Laterality: N/A;   HAMMER TOE SURGERY Right 10/09/2015   Procedure: HAMMER TOE REPAIR WITH K-WIRE FIXATION RIGHT SECOND TOE;  Surgeon: Albertine Patricia, DPM;  Location: [place];  Service: Podiatry;  Laterality: Right;  WITH LOCAL   INGUINAL HERNIA REPAIR Right 05/05/2018   Medium Bard PerFix plug.  Surgeon: Robert Bellow, MD;  Location: [facility] ORS;  Service: General;  Laterality: Right;   JOINT REPLACEMENT Left    left total knee   LAMINECTOMY WITH POSTERIOR LATERAL ARTHRODESIS LEVEL 3 N/A 09/28/2017   Procedure: LUMBAR  POSTEROR  FUSION REVISION - LUMBAR ONE -TWO, LUMBAR TWO -[place] ,BILATERAL ARTHRODESIS REMOVAL LUMBAR THREE HARDWARE;  Surgeon: Kary Kos, MD;  Location: [place];  Service: Neurosurgery;  Laterality: N/A;   LAMINECTOMY WITH POSTERIOR LATERAL ARTHRODESIS LEVEL 3 N/A 10/20/2017   Procedure: Revision fusion and removal of hardware Lumbar one, Pedicle screw fixation thoracic ten-lumbar two, thoracic nine-ten laminotomy, Posterior Lumbar Arthrodesis thoracic ten-lumbar two ;  Surgeon: Kary Kos, MD;  Location: [place];  Service: Neurosurgery;  Laterality: N/A;   LAPAROSCOPIC CHOLECYSTECTOMY  2010   LUMBAR FUSION Right 06/08/2017   LUMBAR THREE-FOUR, LUMBAR FOUR-FIVE POSTEROLATERAL ARTHRODESIS WITH RIGHT LUMBAR FOUR-FIVE LAMINECTOMY/FORAMINOTOMY   LUMBAR LAMINECTOMY/DECOMPRESSION MICRODISCECTOMY Right 03/08/2016   Procedure: Laminectomy and Foraminotomy - Lumbar Five-Sacral One Right;  Surgeon: Kary Kos, MD;  Location: [place];  Service: Neurosurgery;  Laterality: Right;  Right   MULTIPLE TOOTH EXTRACTIONS     "3"   POSTERIOR LUMBAR FUSION  10/14/2016   L5-S1   REPLACEMENT TOTAL KNEE Left 2003   THORACIC AORTIC ANEURYSM REPAIR  2010    Current Outpatient Medications  Medication Sig Dispense Refill    amLODipine (NORVASC) 5 MG tablet Take 1 tablet (5 mg total) by mouth daily. 90 tablet 1   aspirin 325 MG EC tablet Take 325 mg by mouth every evening.      atorvastatin (LIPITOR) 40 MG tablet TAKE ONE TABLET BY MOUTH EVERY DAY 90 tablet 3   cetirizine (ZYRTEC) 10 MG tablet Take 10 mg by mouth daily.     donepezil (ARICEPT) 5 MG tablet Take 5 mg by mouth at bedtime.      fenofibrate 160 MG tablet TAKE ONE TABLET BY MOUTH EVERY DAY (Patient taking differently: Take 160 mg by mouth at bedtime. ) 30 tablet 3   FLUoxetine (PROZAC) 20 MG capsule TAKE 1 CAPSULE (20MG ) BY MOUTH EVERY DAY (Patient taking differently: Take 20 mg by mouth daily. ) 90 capsule 1   gabapentin (NEURONTIN) 400 MG capsule Take 400 mg by mouth 3 (three) times daily.      GAVILAX powder USE 1 CAPFUL IN 4-8 OUNCES OF LIQUID DAILY AS DIRECTED (Patient taking differently: Take 17 g by mouth every other day. ) 510 g 0   HYDROcodone-acetaminophen (NORCO) 10-325 MG tablet Take 1 tablet by mouth every 6 (six) hours as needed for moderate pain. 20 tablet 0   HYDROcodone-acetaminophen (NORCO) 5-325 MG tablet Take 1 tablet by mouth every 4 (four) hours as needed for moderate pain. 15 tablet 0   hydrocortisone cream 1 % Apply 1 application topically daily as needed for itching.     hydroxychloroquine (PLAQUENIL) 200 MG tablet Take  200 mg by mouth daily. Every other day     ibandronate (BONIVA) 150 MG tablet Take 1 tablet (150 mg total) by mouth every 30 (thirty) days. Take with a glass of water, on an empty stomach.  Stay upright for 30 min. 3 tablet 3   lisinopril (PRINIVIL,ZESTRIL) 20 MG tablet TAKE ONE TABLET EVERY EVENING 30 tablet 0   Omeprazole 20 MG TBEC Take 1 tablet (20 mg total) by mouth daily. 30 each 11   testosterone cypionate (DEPOTESTOSTERONE CYPIONATE) 200 MG/ML injection INJECT 70mL INTO THE MUSCLE EVERY 14 DAYS (Patient taking differently: Inject 200 mg into the muscle every 14 (fourteen) days. ) 10 mL 0    traZODone (DESYREL) 50 MG tablet Take 50 mg by mouth at bedtime.     No current facility-administered medications for this visit.      Allergies:   Penicillins and Demerol [meperidine]   Social History:  The patient  reports that he quit smoking about 37 years ago. His smoking use included cigarettes. He has a 32.00 pack-year smoking history. He has never used smokeless tobacco. He reports current alcohol use. He reports that he does not use drugs.   Family History:   family history includes Asthma in his mother; Heart attack (age of onset: 8) in his father; Heart failure in his mother; Hypertension in his mother and sister; Osteoporosis in his mother.    Review of Systems: Review of Systems  Constitutional: Negative.   Eyes: Negative.   Respiratory: Negative.   Cardiovascular: Negative.   Gastrointestinal: Negative.   Genitourinary: Negative.   Musculoskeletal: Negative.   Neurological: Negative.   Psychiatric/Behavioral: Negative.   All other systems reviewed and are negative.    PHYSICAL EXAM: VS:  There were no vitals taken for this visit. , BMI There is no height or weight on file to calculate BMI.  Constitutional:  oriented to person, place, and time. No distress.  HENT:  Head: Grossly normal Eyes:  no discharge. No scleral icterus.  Neck: No JVD, no carotid bruits  Cardiovascular: Regular rate and rhythm, no murmurs appreciated *** Pulmonary/Chest: Clear to auscultation bilaterally, no wheezes or rales Abdominal: Soft.  no distension.  no tenderness.  Musculoskeletal: Normal range of motion Neurological:  normal muscle tone. Coordination normal. No atrophy Skin: Skin warm and dry Psychiatric: normal affect, pleasant   Recent Labs: 10/31/2017: TSH 1.598 12/15/2017: BUN 24; Creatinine, Ser 1.19; Potassium 4.3; Sodium 136 01/12/2018: Magnesium 1.8 03/10/2018: Platelets 110 08/03/2018: Hemoglobin 12.3    Lipid Panel Lab Results  Component Value Date   CHOL 112  12/16/2016   HDL 49.40 12/16/2016   LDLCALC 45 12/16/2016   TRIG 90.0 12/16/2016      Wt Readings from Last 3 Encounters:  08/03/18 172 lb 3.2 oz (78.1 kg)  07/18/18 166 lb (75.3 kg)  05/11/18 174 lb 3.2 oz (79 kg)       ASSESSMENT AND PLAN:   Paroxysmal atrial fibrillation (HCC) Plan: EKG 12-Lead Maintaining normal sinus rhythm Remote history of postoperative atrial fibrillation in 2010 No changes to his medications, stable , not on anticoagulation  Essential hypertension  Plan: EKG 12-Lead Blood pressure stable on current medication regimen, no changes made  CAD in native artery  Plan: EKG 12-Lead Currently with no symptoms of angina. No further workup at this time. Continue current medication regimen.  Stable  Aneurysm of ascending aorta, does post repair/grafting Visualized on previous echocardiogram, mild to moderately dilated 4.4 cm Consider repeat echocardiogram this year  Mixed hyperlipidemia Cholesterol is at goal on the current lipid regimen. No changes to the medications were made.  Stable  S/P AVR (aortic valve replacement) Aortic valve stable, mild stenosis noted Consider repeat echocardiogram in 2019 to evaluate aorta and valve  Cognitive decline stopped Aricept as he was having bad dreams Followed by neurology Reports symptoms are stable  Bradycardia Second-degree AV block type I No workup needed at this time, asymptomatic   Total encounter time more than *** minutes  Greater than 50% was spent in counseling and coordination of care with the patient   Disposition:   F/U  *** months   No orders of the defined types were placed in this encounter.  I, Jesus Reyes am acting as a Education administrator for Ida Rogue, M.D., Ph.D.  {Add scribe attestation statement}  Signed, Esmond Plants, M.D., Ph.D. 08/07/2018  [place], [place]

## 2018-08-04 NOTE — Telephone Encounter (Signed)
-----   Message from Nori Riis, PA-C sent at 08/04/2018  7:51 AM EDT ----- Please let Mr. Babers know that his testosterone level is high.  He needs to reduce his dose to 0.5 cc every two weeks and then we need check his levels one week after his second injection.

## 2018-08-07 ENCOUNTER — Ambulatory Visit: Payer: Medicare Other | Admitting: Cardiovascular Disease

## 2018-08-08 ENCOUNTER — Encounter: Payer: Self-pay | Admitting: Cardiovascular Disease

## 2018-08-08 NOTE — Telephone Encounter (Signed)
Patient was notified and scheduled for blood work

## 2018-08-28 ENCOUNTER — Telehealth: Payer: Self-pay | Admitting: Cardiovascular Disease

## 2018-08-28 NOTE — Telephone Encounter (Signed)
Virtual Visit Pre-Appointment Phone Call  Steps For Call:  1. Confirm consent - "In the setting of the current Covid19 crisis, you are scheduled for a (phone or video) visit with your provider on (date) at (time).  Just as we do with many in-office visits, in order for you to participate in this visit, we must obtain consent.  If you'd like, I can send this to your mychart (if signed up) or email for you to review.  Otherwise, I can obtain your verbal consent now.  All virtual visits are billed to your insurance company just like a normal visit would be.  By agreeing to a virtual visit, we'd like you to understand that the technology does not allow for your provider to perform an examination, and thus may limit your provider's ability to fully assess your condition.  Finally, though the technology is pretty good, we cannot assure that it will always work on either your or our end, and in the setting of a video visit, we may have to convert it to a phone-only visit.  In either situation, we cannot ensure that we have a secure connection.  Are you willing to proceed?"  2. Give patient instructions for WebEx download to smartphone as below if video visit  3. Advise patient to be prepared with any vital sign or heart rhythm information, their current medicines, and a piece of paper and pen handy for any instructions they may receive the day of their visit  4. Inform patient they will receive a phone call 15 minutes prior to their appointment time (may be from unknown caller ID) so they should be prepared to answer  5. Confirm that appointment type is correct in Epic appointment notes (video vs telephone)    TELEPHONE CALL NOTE  Bryan [name] has been deemed a candidate for a follow-up tele-health visit to limit community exposure during the Covid-19 pandemic. I spoke with the patient via phone to ensure availability of phone/video source, confirm preferred email & phone number, and discuss  instructions and expectations.  I reminded Bryan [name] to be prepared with any vital sign and/or heart rhythm information that could potentially be obtained via home monitoring, at the time of his visit. I reminded Bryan [name] to expect a phone call at the time of his visit if his visit.  Did the patient verbally acknowledge consent to treatment? YES  Clarisse Gouge 08/28/2018 11:52 AM   DOWNLOADING THE WEBEX SOFTWARE TO SMARTPHONE  - If Apple, go to [employer] and type in WebEx in the search bar. [employer], the blue/green circle. The app is free but as with any other app downloads, their phone may require them to verify saved payment information or Apple password. The patient does NOT have to create an account.  - If Android, ask patient to go to [employer] and type in WebEx in the search bar. [employer], the blue/green circle. The app is free but as with any other app downloads, their phone may require them to verify saved payment information or Android password. The patient does NOT have to create an account.   CONSENT FOR TELE-HEALTH VISIT - PLEASE REVIEW  I hereby voluntarily request, consent and authorize [facility] and its employed or contracted physicians, physician assistants, nurse practitioners or other licensed health care professionals (the Practitioner), to provide me with telemedicine health care services (the Services") as deemed necessary by the treating Practitioner. I  acknowledge and consent to receive the Services by the Practitioner via telemedicine. I understand that the telemedicine visit will involve communicating with the Practitioner through live audiovisual communication technology and the disclosure of certain medical information by electronic transmission. I acknowledge that I have been given the opportunity to request an in-person assessment or other available alternative prior to the telemedicine  visit and am voluntarily participating in the telemedicine visit.  I understand that I have the right to withhold or withdraw my consent to the use of telemedicine in the course of my care at any time, without affecting my right to future care or treatment, and that the Practitioner or I may terminate the telemedicine visit at any time. I understand that I have the right to inspect all information obtained and/or recorded in the course of the telemedicine visit and may receive copies of available information for a reasonable fee.  I understand that some of the potential risks of receiving the Services via telemedicine include:   Delay or interruption in medical evaluation due to technological equipment failure or disruption;  Information transmitted may not be sufficient (e.g. poor resolution of images) to allow for appropriate medical decision making by the Practitioner; and/or   In rare instances, security protocols could fail, causing a breach of personal health information.  Furthermore, I acknowledge that it is my responsibility to provide information about my medical history, conditions and care that is complete and accurate to the best of my ability. I acknowledge that Practitioner's advice, recommendations, and/or decision may be based on factors not within their control, such as incomplete or inaccurate data provided by me or distortions of diagnostic images or specimens that may result from electronic transmissions. I understand that the practice of medicine is not an exact science and that Practitioner makes no warranties or guarantees regarding treatment outcomes. I acknowledge that I will receive a copy of this consent concurrently upon execution via email to the email address I last provided but may also request a printed copy by calling the office of [facility].    I understand that my insurance will be billed for this visit.   I have read or had this consent read to me.  I  understand the contents of this consent, which adequately explains the benefits and risks of the Services being provided via telemedicine.   I have been provided ample opportunity to ask questions regarding this consent and the Services and have had my questions answered to my satisfaction.  I give my informed consent for the services to be provided through the use of telemedicine in my medical care  By participating in this telemedicine visit I agree to the above.

## 2018-08-28 NOTE — Telephone Encounter (Signed)
Pending consent via Email

## 2018-08-28 NOTE — Progress Notes (Addendum)
Virtual Visit via Video Note   This visit type was conducted due to national recommendations for restrictions regarding the COVID-19 Pandemic (e.g. social distancing) in an effort to limit this patient's exposure and mitigate transmission in our community.  Due to her co-morbid illnesses, this patient is at least at moderate risk for complications without adequate follow up.  This format is felt to be most appropriate for this patient at this time.  All issues noted in this document were discussed and addressed.  A limited physical exam was performed with this format.  Please refer to the patient's chart for her consent to telehealth for [facility].    Date:  08/28/2018   ID:  Bryan [name], DOB 1940-12-19, MRN [id number]  Patient Location:  [place] [address]   Provider location:   [facility], [name]  PCP:  [name], MD  Cardiologist:  Patsy Baltimore  Chief Complaint:  Back pain    History of Present Illness:    Bryan [name] is a 78 y.o. male who presents via audio/video conferencing for a telehealth visit today.   The patient does not symptoms concerning for COVID-19 infection (fever, chills, cough, or new SHORTNESS OF BREATH).   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 bicuspid aortic valve noted in 2009   cardiac catheterization showing mild to moderate CAD,  AVR with bioprosthetic valve, ascending aorta grafting in 2010,  hypertension,  hyperlipidemia,  mild chronic renal insufficiency, total knee replacement in 2004,  appendix rupture and gallbladder surgery in 2010,  Remote history of postoperative atrial fibrillation in 2010 He is a retired Software engineer pulm HTN on echo 2017 Valve with mean gradient 20 mm Hg who presents for routine follow-up of his bioprosthetic aortic valve and dilated ascending aorta   Lost his wife 2 months ago  Weight 167  pound Losing weight BP 156/72 Pulse 62  Several back surgeries Back surgery  5/18 chroni back and leg pain, Fx of L4, Cement  Fusion  Right knee pain  Lab work reviewed with him HCT 32, down from 44 Seen by hemaology, workup negative, started on iron, constipation Was given iron infusion     Prior CV studies:   The following studies were reviewed today:  Echo 11/2015: Left ventricle:  The estimated ejection fraction was in therange of 50% to 55%.  - Aortic valve: A bioprosthesis was present. There was mildstenosis. Mean gradient (S): 20 mm Hg. Valve area (VTI): 1.24 cm^2. - Aortic root: The aortic root was mild to moderately dilated.44 mm (ED). Ascending aorta mildly dilated - Pulmonary arteries:moderately increased.PA peak pressure: 53 mm Hg (S).  Echocardiogram June 2016 discussed with him showing normal LV function, valve gradient consistent with bioprosthetic valve, moderately dilated ascending aorta, stable, 4.4 cm  Echocardiogram July 2015 showing mild to moderate aortic valve stenosis, 4.5 cm ascending aorta. No prior records available  Notes indicate some degree of carotid disease though details are not available.    Past Medical History:  Diagnosis Date   Abnormal CT scan    Asymmetric left rectal wall thickening    Anemia    Anxiety    Arrhythmia    Chronic lower back pain    Complication of anesthesia    Memory loss 09/2015   Coronary artery disease    Depression    GERD (gastroesophageal reflux disease)    Glaucoma    H/O  thoracic aortic aneurysm repair    Heart murmur    History of being hospitalized    memory lose kidney funtion down blood pressure up   History of blood transfusion    "think he had one when he had heart valve OR" (10/14/2016); "none since" (10/19/2017)   History of chicken pox    Hyperlipidemia    Hypertension    Lumbar stenosis    Murmur    Neuropathy    Osteoarthritis    worse in feet and  ankles   Paroxysmal A-fib ([place])    Poor short term memory    takes Aricept   Rheumatoid arthritis ([place])    "all over" (10/14/2016)   Schizophrenia ([place])    Seasonal allergies    Valvular heart disease    Past Surgical History:  Procedure Laterality Date   AORTIC VALVE REPLACEMENT  2007   [facility].  [place], [place]; "pig valve"   APPENDECTOMY  2010   BACK SURGERY     CARDIAC VALVE REPLACEMENT     CATARACT EXTRACTION W/ INTRAOCULAR LENS  IMPLANT, BILATERAL Bilateral 2012   COLONOSCOPY WITH PROPOFOL N/A 09/24/2016   Procedure: COLONOSCOPY WITH PROPOFOL;  Surgeon: Jonathon Bellows, MD;  [facility] END[facility]COPY;  Service: Endoscopy;  Laterality: N/A;   ESOPHAGOGASTRODUODENOSCOPY (EGD) WITH PROPOFOL N/A 09/24/2016   Procedure: ESOPHAGOGASTRODUODENOSCOPY (EGD) WITH PROPOFOL;  Surgeon: Jonathon Bellows, MD;  Location: [facility] ENDOSCOPY;  Service: Endoscopy;  Laterality: N/A;   GIVENS CAPSULE STUDY N/A 09/24/2016   Procedure: GIVENS CAPSULE STUDY;  Surgeon: Jonathon Bellows, MD;  Location: [facility] ENDOSCOPY;  Service: Endoscopy;  Laterality: N/A;   HAMMER TOE SURGERY Right 10/09/2015   Procedure: HAMMER TOE REPAIR WITH K-WIRE FIXATION RIGHT SECOND TOE;  Surgeon: Albertine Patricia, DPM;  Location: [place];  Service: Podiatry;  Laterality: Right;  WITH LOCAL   INGUINAL HERNIA REPAIR Right 05/05/2018   Medium Bard PerFix plug.  Surgeon: Robert Bellow, MD;  Location: [facility] ORS;  Service: General;  Laterality: Right;   JOINT REPLACEMENT Left    left total knee   LAMINECTOMY WITH POSTERIOR LATERAL ARTHRODESIS LEVEL 3 N/A 09/28/2017   Procedure: LUMBAR  POSTEROR  FUSION REVISION - LUMBAR ONE -TWO, LUMBAR TWO -[place] ,BILATERAL ARTHRODESIS REMOVAL LUMBAR THREE HARDWARE;  Surgeon: Kary Kos, MD;  Location: [place];  Service: Neurosurgery;  Laterality: N/A;   LAMINECTOMY WITH POSTERIOR LATERAL ARTHRODESIS LEVEL 3 N/A 10/20/2017   Procedure: Revision fusion and removal of hardware Lumbar  one, Pedicle screw fixation thoracic ten-lumbar two, thoracic nine-ten laminotomy, Posterior Lumbar Arthrodesis thoracic ten-lumbar two ;  Surgeon: Kary Kos, MD;  Location: [place];  Service: Neurosurgery;  Laterality: N/A;   LAPAROSCOPIC CHOLECYSTECTOMY  2010   LUMBAR FUSION Right 06/08/2017   LUMBAR THREE-FOUR, LUMBAR FOUR-FIVE POSTEROLATERAL ARTHRODESIS WITH RIGHT LUMBAR FOUR-FIVE LAMINECTOMY/FORAMINOTOMY   LUMBAR LAMINECTOMY/DECOMPRESSION MICRODISCECTOMY Right 03/08/2016   Procedure: Laminectomy and Foraminotomy - Lumbar Five-Sacral One Right;  Surgeon: Kary Kos, MD;  Location: [place];  Service: Neurosurgery;  Laterality: Right;  Right   MULTIPLE TOOTH EXTRACTIONS     "3"   POSTERIOR LUMBAR FUSION  10/14/2016   L5-S1   REPLACEMENT TOTAL KNEE Left 2003   THORACIC AORTIC ANEURYSM REPAIR  2010     No outpatient medications have been marked as taking for the 08/29/18 encounter (Appointment) with Minna Merritts, MD.     Allergies:   Penicillins and Demerol [meperidine]   Social History   Tobacco Use   Smoking status: Former Smoker  Packs/day: 1.00    Years: 32.00    Pack years: 32.00    Types: Cigarettes    Last attempt to quit: 07/05/1981    Years since quitting: 37.1   Smokeless tobacco: Never Used  Substance Use Topics   Alcohol use: Yes    Comment: 10/19/2017 "glass of wine/month; if that"   Drug use: Never     Current Outpatient Medications on File Prior to Visit  Medication Sig Dispense Refill   amLODipine (NORVASC) 5 MG tablet Take 1 tablet (5 mg total) by mouth daily. 90 tablet 1   aspirin 325 MG EC tablet Take 325 mg by mouth every evening.      atorvastatin (LIPITOR) 40 MG tablet TAKE ONE TABLET BY MOUTH EVERY DAY 90 tablet 3   cetirizine (ZYRTEC) 10 MG tablet Take 10 mg by mouth daily.     donepezil (ARICEPT) 5 MG tablet Take 5 mg by mouth at bedtime.      fenofibrate 160 MG tablet TAKE ONE TABLET BY MOUTH EVERY DAY (Patient taking differently:  Take 160 mg by mouth at bedtime. ) 30 tablet 3   FLUoxetine (PROZAC) 20 MG capsule TAKE 1 CAPSULE (20MG ) BY MOUTH EVERY DAY (Patient taking differently: Take 20 mg by mouth daily. ) 90 capsule 1   gabapentin (NEURONTIN) 400 MG capsule Take 400 mg by mouth 3 (three) times daily.      GAVILAX powder USE 1 CAPFUL IN 4-8 OUNCES OF LIQUID DAILY AS DIRECTED (Patient taking differently: Take 17 g by mouth every other day. ) 510 g 0   HYDROcodone-acetaminophen (NORCO) 10-325 MG tablet Take 1 tablet by mouth every 6 (six) hours as needed for moderate pain. 20 tablet 0   HYDROcodone-acetaminophen (NORCO) 5-325 MG tablet Take 1 tablet by mouth every 4 (four) hours as needed for moderate pain. 15 tablet 0   hydrocortisone cream 1 % Apply 1 application topically daily as needed for itching.     hydroxychloroquine (PLAQUENIL) 200 MG tablet Take 200 mg by mouth daily. Every other day     ibandronate (BONIVA) 150 MG tablet Take 1 tablet (150 mg total) by mouth every 30 (thirty) days. Take with a glass of water, on an empty stomach.  Stay upright for 30 min. 3 tablet 3   lisinopril (PRINIVIL,ZESTRIL) 20 MG tablet TAKE ONE TABLET EVERY EVENING 30 tablet 0   Omeprazole 20 MG TBEC Take 1 tablet (20 mg total) by mouth daily. 30 each 11   testosterone cypionate (DEPOTESTOSTERONE CYPIONATE) 200 MG/ML injection INJECT 71mL INTO THE MUSCLE EVERY 14 DAYS (Patient taking differently: Inject 200 mg into the muscle every 14 (fourteen) days. ) 10 mL 0   traZODone (DESYREL) 50 MG tablet Take 50 mg by mouth at bedtime.     No current facility-administered medications on file prior to visit.      Family Hx: The patient's family history includes Asthma in his mother; Heart attack (age of onset: 4) in his father; Heart failure in his mother; Hypertension in his mother and sister; Osteoporosis in his mother. There is no history of Prostate cancer, Bladder Cancer, Kidney cancer, or Colon cancer.  ROS:   Please see the  history of present illness.    ROS    Labs/Other Tests and Data Reviewed:    Recent Labs: 10/31/2017: TSH 1.598 12/15/2017: BUN 24; Creatinine, Ser 1.19; Potassium 4.3; Sodium 136 01/12/2018: Magnesium 1.8 03/10/2018: Platelets 110 08/03/2018: Hemoglobin 12.3   Recent Lipid Panel Lab Results  Component Value  Date/Time   CHOL 112 12/16/2016 10:06 AM   TRIG 90.0 12/16/2016 10:06 AM   HDL 49.40 12/16/2016 10:06 AM   CHOLHDL 2 12/16/2016 10:06 AM   LDLCALC 45 12/16/2016 10:06 AM    Wt Readings from Last 3 Encounters:  08/03/18 172 lb 3.2 oz (78.1 kg)  07/18/18 166 lb (75.3 kg)  05/11/18 174 lb 3.2 oz (79 kg)     Exam:    Vital Signs: Vital signs as detailed above in HPI  Well nourished, well developed male in no acute distress. Constitutional:  oriented to person, place, and time. No distress.  Head: Normocephalic and atraumatic.  Eyes:  no discharge. No scleral icterus.  Neck: Normal range of motion. Neck supple.  Pulmonary/Chest: No audible wheezing, no distress, appears comfortable Musculoskeletal: Normal range of motion.  no  tenderness or deformity.  Neurological:   Coordination normal. Full exam not performed Skin:  No rash Psychiatric:  normal mood and affect. behavior is normal. Thought content normal.    ASSESSMENT & PLAN:     Paroxysmal atrial fibrillation (HCC) - Plan: EKG 12-Lead Remote history of postoperative atrial fibrillation in 2010 No changes to his medications, stable , not on anticoagulation No palpitations  Essential hypertension - Plan: EKG 12-Lead Blood pressure running high, we have recommended he increase amlodipine up to 10 mg daily He will continue to monitor blood pressure at home  CAD in native artery - Plan: EKG 12-Lead Currently with no symptoms of angina. No further workup at this time. Continue current medication regimen.  Stable Lipid panel at goal No regular exercise program  Aneurysm of ascending aorta, s/post  repair/grafting We have ordered echocardiogram after virus outbreak is complete Also to evaluate prosthetic valve  Mixed hyperlipidemia Numbers at goal, no medication changes made  S/P AVR (aortic valve replacement) Aortic valve stable, mild stenosis noted Repeat echocardiogram ordered, last was 2017  Cognitive decline stopped Aricept as he was having bad dreams Followed by neurology Living alone, reports he is doing fine  Bradycardia Second-degree AV block type I Asymptomatic  Adjustment disorder Recently lost his wife 2 months ago, now with virus outbreak staying in his house alone   COVID-19 Education: The signs and symptoms of COVID-19 were discussed with the patient and how to seek care for testing (follow up with PCP or arrange E-visit).  The importance of social distancing was discussed today.  Patient Risk:   After full review of this patients clinical status, I feel that they are at least moderate risk at this time.  Time:   Today, I have spent 25 minutes with the patient with telehealth technology discussing atrial fibrillation, aortic valve replacement, prosthetic graft, recent loss of his wife, pressure management with changes.     Medication Adjustments/Labs and Tests Ordered: Current medicines are reviewed at length with the patient today.  Concerns regarding medicines are outlined above.   Tests Ordered: No tests ordered   Medication Changes: Increase amlodipine up to 10 mg daily   Disposition: Follow-up in 6 months   Signed, Ida Rogue, MD  08/28/2018 6:27 PM    [place] Office [address] [place] [address], [place], [place] [address]

## 2018-08-29 ENCOUNTER — Other Ambulatory Visit: Payer: Self-pay

## 2018-08-29 ENCOUNTER — Telehealth (INDEPENDENT_AMBULATORY_CARE_PROVIDER_SITE_OTHER): Payer: Medicare Other | Admitting: Cardiovascular Disease

## 2018-08-29 DIAGNOSIS — N179 Acute kidney failure, unspecified: Secondary | ICD-10-CM

## 2018-08-29 DIAGNOSIS — I7121 Aneurysm of the ascending aorta, without rupture: Secondary | ICD-10-CM

## 2018-08-29 DIAGNOSIS — I6523 Occlusion and stenosis of bilateral carotid arteries: Secondary | ICD-10-CM | POA: Diagnosis not present

## 2018-08-29 DIAGNOSIS — I25118 Atherosclerotic heart disease of native coronary artery with other forms of angina pectoris: Secondary | ICD-10-CM | POA: Diagnosis not present

## 2018-08-29 DIAGNOSIS — Z952 Presence of prosthetic heart valve: Secondary | ICD-10-CM

## 2018-08-29 DIAGNOSIS — I10 Essential (primary) hypertension: Secondary | ICD-10-CM

## 2018-08-29 DIAGNOSIS — I712 Thoracic aortic aneurysm, without rupture: Secondary | ICD-10-CM | POA: Diagnosis not present

## 2018-08-29 DIAGNOSIS — I480 Paroxysmal atrial fibrillation: Secondary | ICD-10-CM | POA: Diagnosis not present

## 2018-08-29 MED ORDER — LISINOPRIL 20 MG PO TABS: 20.0000 mg | ORAL_TABLET | Freq: Every evening | ORAL | 3 refills | Status: DC

## 2018-08-29 MED ORDER — AMLODIPINE BESYLATE 10 MG PO TABS: 10.0000 mg | ORAL_TABLET | Freq: Every day | ORAL | 3 refills | Status: DC

## 2018-08-29 MED ORDER — FENOFIBRATE 160 MG PO TABS: 160.0000 mg | ORAL_TABLET | Freq: Every day | ORAL | 3 refills | Status: DC

## 2018-08-29 NOTE — Patient Instructions (Addendum)
Medication Instructions:  Your physician has recommended you make the following change in your medication:  1. INCREASE Amlodipine to 10 mg once daily  Refills sent in on cardiac medications. Your physician has requested that you regularly monitor and record your blood pressure readings at home. Please use the same machine at the same time of day to check your readings and record them to bring to your follow-up visit.  If you need a refill on your cardiac medications before your next appointment, please call your pharmacy.    Lab work: No new labs needed   If you have labs (blood work) drawn today and your tests are completely normal, you will receive your results only by: Marland Kitchen MyChart Message (if you have MyChart) OR . A paper copy in the mail If you have any lab test that is abnormal or we need to change your treatment, we will call you to review the results.   Testing/Procedures: Echocardiogram mid to late summer for AVR and aorta graft Your physician has requested that you have an echocardiogram. Echocardiography is a painless test that uses sound waves to create images of your heart. It provides your doctor with information about the size and shape of your heart and how well your heart's chambers and valves are working. This procedure takes approximately one hour. There are no restrictions for this procedure.    Follow-Up: At [facility], you and your health needs are our priority.  As part of our continuing mission to provide you with exceptional heart care, we have created designated Provider Care Teams.  These Care Teams include your primary Cardiologist (physician) and Advanced Practice Providers (APPs -  Physician Assistants and Nurse Practitioners) who all work together to provide you with the care you need, when you need it.  . You will need a follow up appointment in 6 months .   Please call our office 2 months in advance to schedule this appointment.    . Providers on  your designated Care Team:   . Murray Hodgkins, NP . Christell Faith, PA-C . Marrianne Mood, PA-C  Any Other Special Instructions Will Be Listed Below (If Applicable).  For educational health videos Log in to : [email or web address] Or : [email or web address], password : [email or web address]

## 2018-09-06 ENCOUNTER — Other Ambulatory Visit: Payer: Medicare Other

## 2018-09-21 ENCOUNTER — Other Ambulatory Visit: Payer: Self-pay | Admitting: Neurosurgery

## 2018-09-21 DIAGNOSIS — S32009K Unspecified fracture of unspecified lumbar vertebra, subsequent encounter for fracture with nonunion: Secondary | ICD-10-CM | POA: Diagnosis not present

## 2018-09-26 ENCOUNTER — Other Ambulatory Visit: Payer: Medicare Other

## 2018-09-26 ENCOUNTER — Other Ambulatory Visit: Payer: Self-pay

## 2018-09-26 ENCOUNTER — Ambulatory Visit
Admission: RE | Admit: 2018-09-26 | Discharge: 2018-09-26 | Disposition: A | Discharge status: Home / Self Care | Payer: Medicare Other | Source: Ambulatory Visit | Attending: Neurosurgery | Admitting: Neurosurgery

## 2018-09-26 ENCOUNTER — Other Ambulatory Visit: Payer: Self-pay | Admitting: Radiology

## 2018-09-26 DIAGNOSIS — M549 Dorsalgia, unspecified: Secondary | ICD-10-CM | POA: Diagnosis not present

## 2018-09-26 DIAGNOSIS — N138 Other obstructive and reflux uropathy: Secondary | ICD-10-CM

## 2018-09-26 DIAGNOSIS — S32009K Unspecified fracture of unspecified lumbar vertebra, subsequent encounter for fracture with nonunion: Secondary | ICD-10-CM | POA: Insufficient documentation

## 2018-09-26 DIAGNOSIS — N401 Enlarged prostate with lower urinary tract symptoms: Secondary | ICD-10-CM

## 2018-10-03 ENCOUNTER — Other Ambulatory Visit: Payer: Self-pay | Admitting: Neurosurgery

## 2018-10-03 DIAGNOSIS — S22070A Wedge compression fracture of T9-T10 vertebra, initial encounter for closed fracture: Secondary | ICD-10-CM | POA: Diagnosis not present

## 2018-10-03 DIAGNOSIS — S32009K Unspecified fracture of unspecified lumbar vertebra, subsequent encounter for fracture with nonunion: Secondary | ICD-10-CM | POA: Diagnosis not present

## 2018-10-04 ENCOUNTER — Other Ambulatory Visit: Payer: Medicare Other

## 2018-10-04 ENCOUNTER — Other Ambulatory Visit: Payer: Self-pay

## 2018-10-04 DIAGNOSIS — E291 Testicular hypofunction: Secondary | ICD-10-CM

## 2018-10-04 DIAGNOSIS — N138 Other obstructive and reflux uropathy: Secondary | ICD-10-CM | POA: Diagnosis not present

## 2018-10-04 DIAGNOSIS — N401 Enlarged prostate with lower urinary tract symptoms: Secondary | ICD-10-CM | POA: Diagnosis not present

## 2018-10-05 LAB — TESTOSTERONE: Testosterone: 488 ng/dL (ref 264–916)

## 2018-10-05 LAB — PSA: Prostate Specific Ag, Serum: 0.3 ng/mL (ref 0.0–4.0)

## 2018-10-10 ENCOUNTER — Ambulatory Visit: Payer: Medicare Other | Admitting: Urology

## 2018-10-10 ENCOUNTER — Telehealth: Payer: Self-pay | Admitting: Urology

## 2018-10-10 ENCOUNTER — Telehealth (INDEPENDENT_AMBULATORY_CARE_PROVIDER_SITE_OTHER): Payer: Medicare Other | Admitting: Urology

## 2018-10-10 ENCOUNTER — Ambulatory Visit
Admission: RE | Admit: 2018-10-10 | Discharge: 2018-10-10 | Disposition: A | Discharge status: Home / Self Care | Payer: Medicare Other | Source: Ambulatory Visit | Attending: Neurosurgery | Admitting: Neurosurgery

## 2018-10-10 ENCOUNTER — Other Ambulatory Visit: Payer: Self-pay

## 2018-10-10 DIAGNOSIS — S22070A Wedge compression fracture of T9-T10 vertebra, initial encounter for closed fracture: Secondary | ICD-10-CM | POA: Insufficient documentation

## 2018-10-10 DIAGNOSIS — M546 Pain in thoracic spine: Secondary | ICD-10-CM | POA: Diagnosis not present

## 2018-10-10 DIAGNOSIS — R399 Unspecified symptoms and signs involving the genitourinary system: Secondary | ICD-10-CM

## 2018-10-10 DIAGNOSIS — E291 Testicular hypofunction: Secondary | ICD-10-CM | POA: Diagnosis not present

## 2018-10-10 DIAGNOSIS — N3941 Urge incontinence: Secondary | ICD-10-CM

## 2018-10-10 NOTE — Telephone Encounter (Signed)
Would you check with Bryan [name] insurance to see if it covers PTNS and get him scheduled for treatments in July?

## 2018-10-10 NOTE — Telephone Encounter (Signed)
Yes   Sharyn Lull

## 2018-10-10 NOTE — Progress Notes (Signed)
Virtual Visit via Audio/Visual Note  I connected with Bryan [name] on 10/10/2018 at 0900 by audio/visual and verified that I am speaking with the correct person using two identifiers.  They are located at home.  I am located at my home.   This visit type was conducted due to national recommendations for restrictions regarding the COVID-19 Pandemic (e.g. social distancing).  This format is felt to be most appropriate for this patient at this time.  All issues noted in this document were discussed and addressed.  No physical exam was performed.   I discussed the limitations, risks, security and privacy concerns of performing an evaluation and management service by telephone and the availability of in person appointments. I also discussed with the patient that there may be a patient responsible charge related to this service. The patient expressed understanding and agreed to proceed.   History of Present Illness: Bryan [name] is an 78 year old Caucasian male with testosterone deficiency, LU TS with the most bothersome symptom of urge incontinence   His testosterone level was found to be >1500 in 07/2018 he was advised to reduce his dose to 0.5 cc every two weeks and his levels were rechecked and returned at 488 g/dL in 09/2018.  He is still feeling well with no fatigue.  He will continue that dose.    He is having urge incontinence and is interested in PTNS therapy.  Patient denies any gross hematuria, dysuria or suprapubic/flank pain.  Patient denies any fevers, chills, nausea or vomiting.    Observations/Objective: Bryan [name] is well dressed and groomed.  He is not distressed but is sad due to the loss of his wife in January.  He does state he has two sisters in the area which are looking after him.  He is also having neighbors help him with groceries.    Assessment and Plan:  1. Testosterone deficiency Continue present dose of testosterone cypionate 200 mg/mL, 0.5 cc every two weeks.    Recheck levels in three months with HCT and Hgb.  2. LU TS Continue tamsulosin 0.4 mg  3. Urge incontinence Interested in PTNS  Follow Up Instructions:  Bryan [name] will return in July for PTNS if his insurance company will cover the treatments.  He will also need HCT, hgb and testosterone level one week after an injection in 12/2018.  He will also need a follow up office visit in 03/2019 with PSA, HCT, Hgb and testosterone one week after an injection prior to his appointment.     I discussed the assessment and treatment plan with the patient. The patient was provided an opportunity to ask questions and all were answered. The patient agreed with the plan and demonstrated an understanding of the instructions.   The patient was advised to call back or seek an in-person evaluation if the symptoms worsen or if the condition fails to improve as anticipated.  I provided 7 minutes of face-to-face time during this encounter.   Jnaya Butrick, PA-C

## 2018-10-12 DIAGNOSIS — M0579 Rheumatoid arthritis with rheumatoid factor of multiple sites without organ or systems involvement: Secondary | ICD-10-CM | POA: Diagnosis not present

## 2018-10-13 DIAGNOSIS — S22070A Wedge compression fracture of T9-T10 vertebra, initial encounter for closed fracture: Secondary | ICD-10-CM | POA: Diagnosis not present

## 2018-10-13 DIAGNOSIS — M5440 Lumbago with sciatica, unspecified side: Secondary | ICD-10-CM | POA: Diagnosis not present

## 2018-10-13 DIAGNOSIS — M4807 Spinal stenosis, lumbosacral region: Secondary | ICD-10-CM | POA: Diagnosis not present

## 2018-10-17 DIAGNOSIS — S22070A Wedge compression fracture of T9-T10 vertebra, initial encounter for closed fracture: Secondary | ICD-10-CM | POA: Diagnosis not present

## 2018-10-23 ENCOUNTER — Other Ambulatory Visit (HOSPITAL_COMMUNITY): Payer: Self-pay | Admitting: Interventional Radiology

## 2018-10-23 DIAGNOSIS — S22070A Wedge compression fracture of T9-T10 vertebra, initial encounter for closed fracture: Secondary | ICD-10-CM

## 2018-10-26 ENCOUNTER — Other Ambulatory Visit: Payer: Self-pay | Admitting: Radiology

## 2018-10-27 ENCOUNTER — Other Ambulatory Visit (HOSPITAL_COMMUNITY): Payer: Self-pay | Admitting: Interventional Radiology

## 2018-10-27 ENCOUNTER — Other Ambulatory Visit: Payer: Self-pay

## 2018-10-27 ENCOUNTER — Encounter (HOSPITAL_COMMUNITY): Payer: Self-pay

## 2018-10-27 ENCOUNTER — Ambulatory Visit (HOSPITAL_COMMUNITY)
Admission: RE | Admit: 2018-10-27 | Discharge: 2018-10-27 | Disposition: A | Discharge status: Home / Self Care | Payer: Medicare Other | Source: Ambulatory Visit | Attending: Interventional Radiology | Admitting: Interventional Radiology

## 2018-10-27 DIAGNOSIS — S22070A Wedge compression fracture of T9-T10 vertebra, initial encounter for closed fracture: Secondary | ICD-10-CM | POA: Diagnosis not present

## 2018-10-27 DIAGNOSIS — E785 Hyperlipidemia, unspecified: Secondary | ICD-10-CM | POA: Diagnosis not present

## 2018-10-27 DIAGNOSIS — Z981 Arthrodesis status: Secondary | ICD-10-CM | POA: Insufficient documentation

## 2018-10-27 DIAGNOSIS — K219 Gastro-esophageal reflux disease without esophagitis: Secondary | ICD-10-CM | POA: Insufficient documentation

## 2018-10-27 DIAGNOSIS — H409 Unspecified glaucoma: Secondary | ICD-10-CM | POA: Diagnosis not present

## 2018-10-27 DIAGNOSIS — Z8249 Family history of ischemic heart disease and other diseases of the circulatory system: Secondary | ICD-10-CM | POA: Insufficient documentation

## 2018-10-27 DIAGNOSIS — Z7982 Long term (current) use of aspirin: Secondary | ICD-10-CM | POA: Insufficient documentation

## 2018-10-27 DIAGNOSIS — Z954 Presence of other heart-valve replacement: Secondary | ICD-10-CM | POA: Insufficient documentation

## 2018-10-27 DIAGNOSIS — I2510 Atherosclerotic heart disease of native coronary artery without angina pectoris: Secondary | ICD-10-CM | POA: Insufficient documentation

## 2018-10-27 DIAGNOSIS — I10 Essential (primary) hypertension: Secondary | ICD-10-CM | POA: Insufficient documentation

## 2018-10-27 DIAGNOSIS — Z96652 Presence of left artificial knee joint: Secondary | ICD-10-CM | POA: Diagnosis not present

## 2018-10-27 DIAGNOSIS — Z885 Allergy status to narcotic agent status: Secondary | ICD-10-CM | POA: Insufficient documentation

## 2018-10-27 DIAGNOSIS — Z880 Allergy status to penicillin: Secondary | ICD-10-CM | POA: Diagnosis not present

## 2018-10-27 DIAGNOSIS — I480 Paroxysmal atrial fibrillation: Secondary | ICD-10-CM | POA: Diagnosis not present

## 2018-10-27 DIAGNOSIS — Z8679 Personal history of other diseases of the circulatory system: Secondary | ICD-10-CM | POA: Diagnosis not present

## 2018-10-27 DIAGNOSIS — Z87891 Personal history of nicotine dependence: Secondary | ICD-10-CM | POA: Diagnosis not present

## 2018-10-27 DIAGNOSIS — R413 Other amnesia: Secondary | ICD-10-CM | POA: Insufficient documentation

## 2018-10-27 DIAGNOSIS — G629 Polyneuropathy, unspecified: Secondary | ICD-10-CM | POA: Diagnosis not present

## 2018-10-27 DIAGNOSIS — M069 Rheumatoid arthritis, unspecified: Secondary | ICD-10-CM | POA: Insufficient documentation

## 2018-10-27 DIAGNOSIS — Z79899 Other long term (current) drug therapy: Secondary | ICD-10-CM | POA: Insufficient documentation

## 2018-10-27 DIAGNOSIS — Z8262 Family history of osteoporosis: Secondary | ICD-10-CM | POA: Diagnosis not present

## 2018-10-27 DIAGNOSIS — M1990 Unspecified osteoarthritis, unspecified site: Secondary | ICD-10-CM | POA: Insufficient documentation

## 2018-10-27 HISTORY — PX: IR VERTEBROPLASTY CERV/THOR BX INC UNI/BIL INC/INJECT/IMAGING: IMG5515

## 2018-10-27 LAB — CBC
HCT: 35.6 % — ABNORMAL LOW (ref 39.0–52.0)
Hemoglobin: 11.9 g/dL — ABNORMAL LOW (ref 13.0–17.0)
MCH: 32.5 pg (ref 26.0–34.0)
MCHC: 33.4 g/dL (ref 30.0–36.0)
MCV: 97.3 fL (ref 80.0–100.0)
Platelets: 120 10*3/uL — ABNORMAL LOW (ref 150–400)
RBC: 3.66 MIL/uL — ABNORMAL LOW (ref 4.22–5.81)
RDW: 14.2 % (ref 11.5–15.5)
WBC: 3.2 10*3/uL — ABNORMAL LOW (ref 4.0–10.5)
nRBC: 0 % (ref 0.0–0.2)

## 2018-10-27 LAB — PROTIME-INR
INR: 1.1 (ref 0.8–1.2)
Prothrombin Time: 13.6 seconds (ref 11.4–15.2)

## 2018-10-27 LAB — URINALYSIS, COMPLETE (UACMP) WITH MICROSCOPIC
Bacteria, UA: NONE SEEN
Bilirubin Urine: NEGATIVE
Glucose, UA: NEGATIVE mg/dL
Hgb urine dipstick: NEGATIVE
Ketones, ur: NEGATIVE mg/dL
Leukocytes,Ua: NEGATIVE
Nitrite: NEGATIVE
Protein, ur: 100 mg/dL — AB
Specific Gravity, Urine: 1.023 (ref 1.005–1.030)
pH: 5 (ref 5.0–8.0)

## 2018-10-27 LAB — BASIC METABOLIC PANEL
Anion gap: 11 (ref 5–15)
BUN: 26 mg/dL — ABNORMAL HIGH (ref 8–23)
CO2: 25 mmol/L (ref 22–32)
Calcium: 9.4 mg/dL (ref 8.9–10.3)
Chloride: 102 mmol/L (ref 98–111)
Creatinine, Ser: 1.29 mg/dL — ABNORMAL HIGH (ref 0.61–1.24)
GFR calc Af Amer: >60 mL/min (ref >60)
GFR calc non Af Amer: 52 mL/min — ABNORMAL LOW (ref >60)
Glucose, Bld: 94 mg/dL (ref 70–99)
Potassium: 4.1 mmol/L (ref 3.5–5.1)
Sodium: 138 mmol/L (ref 135–145)

## 2018-10-27 MED ORDER — FENTANYL CITRATE (PF) 100 MCG/2ML IJ SOLN
INTRAMUSCULAR | Status: AC | PRN
  Administered 2018-10-27 (×2): 25 ug via INTRAVENOUS

## 2018-10-27 MED ORDER — MIDAZOLAM HCL 2 MG/2ML IJ SOLN
INTRAMUSCULAR | Status: AC
  Filled 2018-10-27: qty 2

## 2018-10-27 MED ORDER — VANCOMYCIN HCL IN DEXTROSE 1-5 GM/200ML-% IV SOLN
INTRAVENOUS | Status: AC
  Filled 2018-10-27: qty 200

## 2018-10-27 MED ORDER — HYDRALAZINE HCL 20 MG/ML IJ SOLN
INTRAMUSCULAR | Status: AC
  Filled 2018-10-27: qty 1

## 2018-10-27 MED ORDER — BUPIVACAINE HCL (PF) 0.5 % IJ SOLN
INTRAMUSCULAR | Status: AC
  Filled 2018-10-27: qty 30

## 2018-10-27 MED ORDER — MIDAZOLAM HCL 2 MG/2ML IJ SOLN
INTRAMUSCULAR | Status: AC | PRN
  Administered 2018-10-27 (×2): 1 mg via INTRAVENOUS

## 2018-10-27 MED ORDER — TOBRAMYCIN SULFATE 1.2 G IJ SOLR
INTRAMUSCULAR | Status: AC
  Filled 2018-10-27: qty 1.2

## 2018-10-27 MED ORDER — IOHEXOL 300 MG/ML  SOLN: 3.0000 mL | Freq: Once | INTRAMUSCULAR | Status: DC | PRN

## 2018-10-27 MED ORDER — FENTANYL CITRATE (PF) 100 MCG/2ML IJ SOLN
INTRAMUSCULAR | Status: AC
  Filled 2018-10-27: qty 2

## 2018-10-27 MED ORDER — GELATIN ABSORBABLE 12-7 MM EX MISC
CUTANEOUS | Status: AC
  Filled 2018-10-27: qty 1

## 2018-10-27 MED ORDER — HYDRALAZINE HCL 20 MG/ML IJ SOLN
INTRAMUSCULAR | Status: AC | PRN
  Administered 2018-10-27: 5 mg via INTRAVENOUS

## 2018-10-27 MED ORDER — VANCOMYCIN HCL IN DEXTROSE 1-5 GM/200ML-% IV SOLN
1000.0000 mg | INTRAVENOUS | Status: AC
  Administered 2018-10-27: 1000 mg via INTRAVENOUS

## 2018-10-27 MED ORDER — BUPIVACAINE HCL 0.5 % IJ SOLN
INTRAMUSCULAR | Status: AC | PRN
  Administered 2018-10-27: 20 mL

## 2018-10-27 MED ORDER — SODIUM CHLORIDE 0.9 % IV SOLN: INTRAVENOUS | Status: DC

## 2018-10-27 MED ORDER — SODIUM CHLORIDE 0.9 % IV SOLN: INTRAVENOUS | Status: AC

## 2018-10-27 NOTE — Procedures (Signed)
S/P T 9 VP

## 2018-10-27 NOTE — Discharge Instructions (Signed)
1.no stooping,bending or lifting more than 10 lbs for 2 weeks. 2.Use walker to ambulate for 2 weeks. 3.Refrain from driving for 2 weeks. 4.RTC PRN  2 weeks Balloon Kyphoplasty, Care After Refer to this sheet in the next few weeks. These instructions provide you with information about caring for yourself after your procedure. Your health care provider may also give you more specific instructions. Your treatment has been planned according to current medical practices, but problems sometimes occur. Call your health care provider if you have any problems or questions after your procedure. What can I expect after the procedure? After your procedure, it is common to have back pain. Follow these instructions at home: Incision care  Follow instructions from your health care provider about how to take care of your incisions. Make sure you: ? Wash your hands with soap and water before you change your bandage (dressing). If soap and water are not available, use hand sanitizer. ? Change your dressing as told by your health care provider. ? Leave stitches (sutures), skin glue, or adhesive strips in place. These skin closures may need to be in place for 2 weeks or longer. If adhesive strip edges start to loosen and curl up, you may trim the loose edges. Do not remove adhesive strips completely unless your health care provider tells you to do that.  Check your incision area every day for signs of infection. Watch for: ? Redness, swelling, or pain. ? Fluid, blood, or pus.  Keep your dressing dry until your health care provider says that it can be removed. Activity   Rest your back and avoid intense physical activity for as long as told by your health care provider.  Return to your normal activities as told by your health care provider. Ask your health care provider what activities are safe for you.  Do not lift anything that is heavier than 10 lb (4.5 kg). This is about the weight of a gallon of  milk.You may need to avoid heavy lifting for several weeks. General instructions  Take over-the-counter and prescription medicines only as told by your health care provider.  If directed, apply ice to the painful area: ? Put ice in a plastic bag. ? Place a towel between your skin and the bag. ? Leave the ice on for 20 minutes, 2-3 times per day.  Do not use tobacco products, including cigarettes, chewing tobacco, or e-cigarettes. If you need help quitting, ask your health care provider.  Keep all follow-up visits as told by your health care provider. This is important. Contact a health care provider if:  You have a fever.  You have redness, swelling, or pain at the site of your incisions.  You have fluid, blood, or pus coming from your incisions.  You have pain that gets worse or does not get better with medicine.  You develop numbness or weakness in any part of your body. Get help right away if:  You have chest pain.  You have difficulty breathing.  You cannot move your legs.  You cannot control your bladder or bowel movements.  You suddenly become weak or numb on one side of your body.  You become very confused.  You have trouble speaking or understanding, or both. This information is not intended to replace advice given to you by your health care provider. Make sure you discuss any questions you have with your health care provider. Document Released: 01/29/2015 Document Revised: 10/16/2015 Document Reviewed: 09/02/2014 Elsevier Interactive Patient Education  2019  [employer] ° °

## 2018-10-27 NOTE — H&P (Signed)
Chief Complaint: Patient was seen in consultation today for T9 compression fracture   Supervising Physician: Luanne Bras  Patient Status: [facility] - Out-pt  History of Present Illness: Bryan [name] is a 78 y.o. male with past medical history of HTN, HLD, anxiety, chronic back s/p multiple interventions in the past 5 years including lumbar fusion, laminectomy, and microdiscectomy who developed acute-on-chronic back pain over the past several months.  He was found to have a compression fracture of T9. Patient referred to Neurointerventional Radiology for evaluation of his fracture.  He has elected to proceed with vertebroplasty/kyphoplasty and presents to radiology today for same.   He denies fever, chills, nausea, vomiting, shortness of breath, cough, abdominal pain, dysuria.  He continues to have back pain which has limited his activity at home. Denies trauma, fall, or known mechanism of injury. He walks with a cane.  He has been NPO.  He does not take blood thinners.   Past Medical History:  Diagnosis Date  . Abnormal CT scan    Asymmetric left rectal wall thickening   . Anemia   . Anxiety   . Arrhythmia   . Chronic lower back pain   . Complication of anesthesia    Memory loss 09/2015  . Coronary artery disease   . Depression   . GERD (gastroesophageal reflux disease)   . Glaucoma   . H/O thoracic aortic aneurysm repair   . Heart murmur   . History of being hospitalized    memory lose kidney funtion down blood pressure up  . History of blood transfusion    "think he had one when he had heart valve OR" (10/14/2016); "none since" (10/19/2017)  . History of chicken pox   . Hyperlipidemia   . Hypertension   . Lumbar stenosis   . Murmur   . Neuropathy   . Osteoarthritis    worse in feet and ankles  . Paroxysmal A-fib ([place])   . Poor short term memory    takes Aricept  . Rheumatoid arthritis ([place])    "all over" (10/14/2016)  . Schizophrenia ([place])   . Seasonal  allergies   . Valvular heart disease     Past Surgical History:  Procedure Laterality Date  . AORTIC VALVE REPLACEMENT  2007   [facility].  [place], [place]; "pig valve"  . APPENDECTOMY  2010  . BACK SURGERY    . CARDIAC VALVE REPLACEMENT    . CATARACT EXTRACTION W/ INTRAOCULAR LENS  IMPLANT, BILATERAL Bilateral 2012  . COLONOSCOPY WITH PROPOFOL N/A 09/24/2016   Procedure: COLONOSCOPY WITH PROPOFOL;  Surgeon: Jonathon Bellows, MD;  Location: [facility] ENDOSCOPY;  Service: Endoscopy;  Laterality: N/A;  . ESOPHAGOGASTRODUODENOSCOPY (EGD) WITH PROPOFOL N/A 09/24/2016   Procedure: ESOPHAGOGASTRODUODENOSCOPY (EGD) WITH PROPOFOL;  Surgeon: Jonathon Bellows, MD;  Location: [facility] ENDOSCOPY;  Service: Endoscopy;  Laterality: N/A;  . GIVENS CAPSULE STUDY N/A 09/24/2016   Procedure: GIVENS CAPSULE STUDY;  Surgeon: Jonathon Bellows, MD;  Location: [facility] ENDOSCOPY;  Service: Endoscopy;  Laterality: N/A;  . HAMMER TOE SURGERY Right 10/09/2015   Procedure: HAMMER TOE REPAIR WITH K-WIRE FIXATION RIGHT SECOND TOE;  Surgeon: Albertine Patricia, DPM;  Location: [place];  Service: Podiatry;  Laterality: Right;  WITH LOCAL  . INGUINAL HERNIA REPAIR Right 05/05/2018   Medium Bard PerFix plug.  Surgeon: Robert Bellow, MD;  Location: [facility] ORS;  Service: General;  Laterality: Right;  . JOINT REPLACEMENT Left    left total knee  . LAMINECTOMY WITH POSTERIOR LATERAL ARTHRODESIS  LEVEL 3 N/A 09/28/2017   Procedure: LUMBAR  POSTEROR  FUSION REVISION - LUMBAR ONE -TWO, LUMBAR TWO -THREE,THREE-FOUR ,BILATERAL ARTHRODESIS REMOVAL LUMBAR THREE HARDWARE;  Surgeon: Kary Kos, MD;  Location: [place];  Service: Neurosurgery;  Laterality: N/A;  . LAMINECTOMY WITH POSTERIOR LATERAL ARTHRODESIS LEVEL 3 N/A 10/20/2017   Procedure: Revision fusion and removal of hardware Lumbar one, Pedicle screw fixation thoracic ten-lumbar two, thoracic nine-ten laminotomy, Posterior Lumbar Arthrodesis thoracic ten-lumbar two ;  Surgeon: Kary Kos, MD;  Location:  [place];  Service: Neurosurgery;  Laterality: N/A;  . LAPAROSCOPIC CHOLECYSTECTOMY  2010  . LUMBAR FUSION Right 06/08/2017   LUMBAR THREE-FOUR, LUMBAR FOUR-FIVE POSTEROLATERAL ARTHRODESIS WITH RIGHT LUMBAR FOUR-FIVE LAMINECTOMY/FORAMINOTOMY  . LUMBAR LAMINECTOMY/DECOMPRESSION MICRODISCECTOMY Right 03/08/2016   Procedure: Laminectomy and Foraminotomy - Lumbar Five-Sacral One Right;  Surgeon: Kary Kos, MD;  Location: [place];  Service: Neurosurgery;  Laterality: Right;  Right  . MULTIPLE TOOTH EXTRACTIONS     "3"  . POSTERIOR LUMBAR FUSION  10/14/2016   L5-S1  . REPLACEMENT TOTAL KNEE Left 2003  . THORACIC AORTIC ANEURYSM REPAIR  2010    Allergies: Penicillins and Demerol [meperidine]  Medications: Prior to Admission medications   Medication Sig Start Date End Date Taking? Authorizing Provider  amLODipine (NORVASC) 10 MG tablet Take 1 tablet (10 mg total) by mouth daily. 08/29/18 11/27/18 Yes Minna Merritts, MD  aspirin 325 MG EC tablet Take 325 mg by mouth every evening.    Yes [provider]  atorvastatin (LIPITOR) 40 MG tablet TAKE ONE TABLET BY MOUTH EVERY DAY Patient taking differently: Take 40 mg by mouth at bedtime.  07/04/18  Yes [name], MD  Calcium Carbonate (CALCARB 600 PO) Take 600 mg by mouth daily.   Yes [provider]  cetirizine (ZYRTEC) 10 MG tablet Take 10 mg by mouth daily.   Yes [provider]  cholecalciferol (VITAMIN D3) 25 MCG (1000 UT) tablet Take 1,000 Units by mouth daily.   Yes [provider]  fenofibrate 160 MG tablet Take 1 tablet (160 mg total) by mouth at bedtime. 08/29/18  Yes Gollan, Kathlene November, MD  FLUoxetine (PROZAC) 20 MG capsule TAKE 1 CAPSULE (20MG ) BY MOUTH EVERY DAY Patient taking differently: Take 20 mg by mouth daily.  03/31/18  Yes [name], MD  Glycerin-Hypromellose-PEG 400 (ARTIFICIAL TEARS) 0.2-0.2-1 % SOLN Place 1 drop into both eyes daily as needed (dry eyes).   Yes [provider]   HYDROcodone-acetaminophen (NORCO) 5-325 MG tablet Take 1 tablet by mouth every 4 (four) hours as needed for moderate pain. 05/05/18  Yes Byrnett, Forest Gleason, MD  hydrocortisone cream 1 % Apply 1 application topically daily as needed for itching.   Yes [provider]  hydroxychloroquine (PLAQUENIL) 200 MG tablet Take 200 mg by mouth every other day.    Yes [provider]  ibandronate (BONIVA) 150 MG tablet Take 1 tablet (150 mg total) by mouth every 30 (thirty) days. Take with a glass of water, on an empty stomach.  Stay upright for 30 min. 07/18/18  Yes Renato Shin, MD  lisinopril (PRINIVIL,ZESTRIL) 20 MG tablet Take 1 tablet (20 mg total) by mouth every evening. 08/29/18  Yes Gollan, Kathlene November, MD  Omeprazole 20 MG TBEC Take 1 tablet (20 mg total) by mouth daily. 07/18/18  Yes Renato Shin, MD  testosterone cypionate (DEPOTESTOSTERONE CYPIONATE) 200 MG/ML injection INJECT 64mL INTO THE MUSCLE EVERY 14 DAYS Patient taking differently: Inject 100 mg into the muscle  every 14 (fourteen) days.  04/13/18  Yes McGowan, Larene Beach A, PA-C  traZODone (DESYREL) 50 MG tablet Take 50 mg by mouth at bedtime.   Yes [provider]  vitamin B-12 (CYANOCOBALAMIN) 1000 MCG tablet Take 1,000 mcg by mouth daily.   Yes [provider]     Family History  Problem Relation Age of Onset  . Heart failure Mother   . Hypertension Mother   . Asthma Mother   . Osteoporosis Mother   . Heart attack Father 26       MI  . Hypertension Sister   . Prostate cancer Neg Hx   . Bladder Cancer Neg Hx   . Kidney cancer Neg Hx   . Colon cancer Neg Hx     Social History   Socioeconomic History  . Marital status: Widowed    Spouse name: Not on file  . Number of children: Not on file  . Years of education: Not on file  . Highest education level: Not on file  Occupational History  . Occupation: retired  Scientific laboratory technician  . Financial resource strain: Not on file  . Food insecurity:    Worry:  Not on file    Inability: Not on file  . Transportation needs:    Medical: Not on file    Non-medical: Not on file  Tobacco Use  . Smoking status: Former Smoker    Packs/day: 1.00    Years: 32.00    Pack years: 32.00    Types: Cigarettes    Last attempt to quit: 07/05/1981    Years since quitting: 37.3  . Smokeless tobacco: Never Used  Substance and Sexual Activity  . Alcohol use: Yes    Comment: 10/19/2017 "glass of wine/month; if that"  . Drug use: Never  . Sexual activity: Not Currently  Lifestyle  . Physical activity:    Days per week: Not on file    Minutes per session: Not on file  . Stress: Not on file  Relationships  . Social connections:    Talks on phone: Not on file    Gets together: Not on file    Attends religious service: Not on file    Active member of club or organization: Not on file    Attends meetings of clubs or organizations: Not on file    Relationship status: Not on file  Other Topics Concern  . Not on file  Social History Narrative  . Not on file     Review of Systems: A 12 point ROS discussed and pertinent positives are indicated in the HPI above.  All other systems are negative.  Review of Systems  Constitutional: Negative for fatigue and fever.  Respiratory: Negative for cough and shortness of breath.   Cardiovascular: Negative for chest pain.  Gastrointestinal: Negative for abdominal pain.  Genitourinary: Negative for dysuria.  Musculoskeletal: Positive for back pain.  Psychiatric/Behavioral: Negative for behavioral problems and confusion.    Vital Signs: BP 121/70   Pulse (!) 48   Temp 97.6 F (36.4 C) (Oral)   Resp 16   Ht 6' (1.829 m)   Wt 165 lb (74.8 kg)   SpO2 97%   BMI 22.38 kg/m   Physical Exam Vitals signs and nursing note reviewed.  Constitutional:      Appearance: Normal appearance. He is normal weight.  HENT:     Mouth/Throat:     Mouth: Mucous membranes are moist.     Pharynx: Oropharynx is clear.   Cardiovascular:  Rate and Rhythm: Normal rate and regular rhythm.     Pulses: Normal pulses.     Heart sounds: Murmur present. No friction rub. No gallop.   Pulmonary:     Effort: Pulmonary effort is normal. No respiratory distress.     Breath sounds: Normal breath sounds.  Abdominal:     General: Abdomen is flat.     Palpations: Abdomen is soft.  Musculoskeletal:        General: Tenderness (mid back, point tenderness worse with movement consistent with compression fracture) present.  Skin:    General: Skin is warm and dry.  Neurological:     General: No focal deficit present.     Mental Status: He is alert and oriented to person, place, and time. Mental status is at baseline.  Psychiatric:        Mood and Affect: Mood normal.        Behavior: Behavior normal.        Thought Content: Thought content normal.        Judgment: Judgment normal.      MD Evaluation Airway: WNL Heart: WNL Abdomen: WNL Chest/ Lungs: WNL ASA  Classification: 3 Mallampati/Airway Score: Two   Imaging: Mr Thoracic Spine Wo Contrast  Result Date: 10/11/2018 CLINICAL DATA:  Mid back pain for 2 months. History of osteoporosis, fusion and T9 compression fracture. EXAM: MRI THORACIC SPINE WITHOUT CONTRAST TECHNIQUE: Multiplanar, multisequence MR imaging of the thoracic spine was performed. No intravenous contrast was administered. COMPARISON:  CT 09/26/2018 and 12/05/2017. FINDINGS: Alignment: Stable gradual thoracic kyphosis compared with recent CT. No focal angulation or listhesis. Vertebrae: Recently demonstrated T9 compression fracture demonstrates no progressive loss of height compared with the CT 2 weeks ago. There is approximately 60% loss of vertebral body height and minimal marrow edema in the vertebral body. There is no osseous retropulsion. No new or definite acute fractures are identified. The healing L1 fracture appears unchanged with mild osseous retropulsion. Previous spinal augmentation at  T10, T11 and T12. Posterior fusion hardware extending from T10 into the lumbar spine is incompletely visualized inferiorly. Loosening of the T10 and T11 pedicle screws was seen on recent CT. Cord: Normal in signal and caliber. The conus medullaris extends to the L2 level. Paraspinal and other soft tissues: No paraspinal abnormalities. Small bilateral renal cysts. Disc levels: No disc herniation, spinal stenosis or nerve root encroachment identified. The osseous retropulsion at L1 is not well visualized on the axial images, although appears stable from recent CT. IMPRESSION: 1. T9 compression fracture with approximately 60% loss of vertebral body height demonstrates only minimal marrow edema and is likely subacute. This appears unchanged from CT of 2 weeks ago. 2. No acute or new fractures. Stable appearance of L1 fracture post thoracolumbar fusion. 3. Upper hardware loosening, better seen on recent CT. 4. No spinal stenosis or nerve root encroachment. Electronically Signed   By: Richardean Sale M.D.   On: 10/11/2018 12:48    Labs:  CBC: Recent Labs    12/15/17 1356 01/20/18 1321 03/10/18 1109 08/03/18 1616 10/27/18 0644  WBC 2.9* 3.3* 4.1  --  3.2*  HGB 8.7* 9.9* 11.3* 12.3* 11.9*  HCT 26.4* 30.5* 35.0* 37.4* 35.6*  PLT 204 117* 110*  --  120*    COAGS: Recent Labs    10/27/18 0644  INR 1.1    BMP: Recent Labs    12/05/17 0954 12/12/17 0936 12/15/17 1356 01/12/18 1435 10/27/18 0644  NA 139 137 136  --  138  K 4.2 4.7 4.3  --  4.1  CL 107 105 105  --  102  CO2 24 24 22   --  25  GLUCOSE 91 117* 124*  --  94  BUN 24* 33* 24*  --  26*  CALCIUM 8.8* 9.1 9.2 8.7 9.4  CREATININE 1.34* 1.38* 1.19  --  1.29*  GFRNONAA 49* 47* 56*  --  52*  GFRAA 56* 55* >60  --  >60    LIVER FUNCTION TESTS: No results for input(s): BILITOT, AST, ALT, ALKPHOS, PROT, ALBUMIN in the last 8760 hours.  TUMOR MARKERS: No results for input(s): AFPTM, CEA, CA199, CHROMGRNA in the last 8760 hours.   Assessment and Plan: Patient with past medical history of degenerative disc disease, lumbar stenosis s/p several surgeries/interventions now presents with complaint of acute-on-chronic back pain.  He is found to have a compression fracture of T9.  IR consulted for vertebroplasty/kyphoplasty at the request of Dr. Saintclair Halsted. Case reviewed by Dr. Estanislado Pandy who approves patient for procedure.  Patient presents today in their usual state of health.  He has been NPO and is not currently on blood thinners.  He does have a mildly elevated SCr and is aware.  He states he has seen Dr. Candiss Norse in the past for acute kidney injury.   Risks and benefits were discussed with the patient including, but not limited to education regarding the natural healing process of compression fractures without intervention, bleeding, infection, cement migration which may cause spinal cord damage, paralysis, pulmonary embolism or even death.  This interventional procedure involves the use of X-rays and because of the nature of the planned procedure, it is possible that we will have prolonged use of X-ray fluoroscopy.  Potential radiation risks to you include (but are not limited to) the following: - A slightly elevated risk for cancer  several years later in life. This risk is typically less than 0.5% percent. This risk is low in comparison to the normal incidence of human cancer, which is 33% for women and 50% for men according to the [employer]. - Radiation induced injury can include skin redness, resembling a rash, tissue breakdown / ulcers and hair loss (which can be temporary or permanent).   The likelihood of either of these occurring depends on the difficulty of the procedure and whether you are sensitive to radiation due to previous procedures, disease, or genetic conditions.   IF your procedure requires a prolonged use of radiation, you will be notified and given written instructions for further action.  It is  your responsibility to monitor the irradiated area for the 2 weeks following the procedure and to notify your physician if you are concerned that you have suffered a radiation induced injury.    All of the patient's questions were answered, patient is agreeable to proceed.  Consent signed and in chart.  Thank you for this interesting consult.  I greatly enjoyed meeting Bryan [name] and look forward to participating in their care.  A copy of this report was sent to the requesting provider on this date.  Electronically Signed: Docia Barrier, PA 10/27/2018, 8:26 AM   I spent a total of  30 Minutes   in face to face in clinical consultation, greater than 50% of which was counseling/coordinating care for T9 compression fracture.

## 2018-10-30 ENCOUNTER — Encounter (HOSPITAL_COMMUNITY): Payer: Self-pay | Admitting: Interventional Radiology

## 2018-11-03 ENCOUNTER — Telehealth (HOSPITAL_COMMUNITY): Payer: Self-pay | Admitting: Radiology

## 2018-11-03 ENCOUNTER — Telehealth: Payer: Self-pay | Admitting: Student

## 2018-11-03 NOTE — Telephone Encounter (Signed)
Tried to call pt back after speaking with Ally/Dev, no answer and no VM JM

## 2018-11-03 NOTE — Telephone Encounter (Signed)
Patient declined to schedule PTNS due to having just had a back procedure. Bryan [name] he will call back later if he decides to schedule   Bryan [name]

## 2018-11-03 NOTE — Telephone Encounter (Signed)
Received message from patient regarding back pain.  Patient with history of T9 compression fracture s/p image-guided T9 vertebroplasty 10/27/2018 by Dr. Estanislado Pandy.  Attempted to call patient x4, no answer. Will try again on Monday.   Bea Graff Noreen Mackintosh, PA-C 11/03/2018, 4:34 PM

## 2018-11-06 ENCOUNTER — Telehealth: Payer: Self-pay | Admitting: Student

## 2018-11-06 NOTE — Telephone Encounter (Signed)
That's fine regarding his PTNS, but he does need blood work in August (Hgb, HCT and a testosterone level one week after an injection).

## 2018-11-06 NOTE — Telephone Encounter (Signed)
IR.  Received message from patient regarding back pain.  Patient with history of T9 compression fracture s/p image-guided T9 vertebroplasty 10/27/2018 by Dr. Estanislado Pandy.  Called patient at [phone]. Patient states that he has back pain. States that his pain never went away, but changed. States that the midline back pain prior to procedure is gone, but he now has pain that extends around his ribs. States that he was laying down for days, but over the weekend began to move around, and pain had improved. Explained that pain could be related to laying flat/not moving for so long. Explained no evidence of new fracture based on the pain he is describing to me. Advised patient to follow-up with his referring neurosurgeon or his PCP for further management. Advised patient that if his pain becomes severe and he cannot function, to head to ED for further evaluation. All questions answered and concerns addressed. Patient conveys understanding and agrees with plan.  Please call IR with questions/concerns.   Bea Graff Beryle Zeitz, PA-C 11/06/2018, 9:44 AM

## 2018-11-16 ENCOUNTER — Other Ambulatory Visit: Payer: Medicare Other

## 2018-11-16 ENCOUNTER — Telehealth: Payer: Self-pay | Admitting: Family Medicine

## 2018-11-16 ENCOUNTER — Ambulatory Visit (INDEPENDENT_AMBULATORY_CARE_PROVIDER_SITE_OTHER): Payer: Medicare Other | Admitting: Family Medicine

## 2018-11-16 ENCOUNTER — Other Ambulatory Visit: Payer: Self-pay

## 2018-11-16 DIAGNOSIS — R61 Generalized hyperhidrosis: Secondary | ICD-10-CM | POA: Diagnosis not present

## 2018-11-16 DIAGNOSIS — Z20822 Contact with and (suspected) exposure to covid-19: Secondary | ICD-10-CM

## 2018-11-16 DIAGNOSIS — G4700 Insomnia, unspecified: Secondary | ICD-10-CM

## 2018-11-16 DIAGNOSIS — R6889 Other general symptoms and signs: Secondary | ICD-10-CM | POA: Diagnosis not present

## 2018-11-16 DIAGNOSIS — Z20828 Contact with and (suspected) exposure to other viral communicable diseases: Secondary | ICD-10-CM | POA: Diagnosis not present

## 2018-11-16 MED ORDER — TRAZODONE HCL 50 MG PO TABS: ORAL_TABLET | ORAL | 2 refills | Status: DC

## 2018-11-16 NOTE — Telephone Encounter (Signed)
Spoke with patient scheduled him for appointment today at [facility] at 12pm.  Testing protocol reviewed.

## 2018-11-16 NOTE — Progress Notes (Signed)
Patient ID: Bryan [name], male   DOB: 1941/03/21, 78 y.o.   MRN: [id number]    Virtual Visit via video Note  This visit type was conducted due to national recommendations for restrictions regarding the COVID-19 pandemic (e.g. social distancing).  This format is felt to be most appropriate for this patient at this time.  All issues noted in this document were discussed and addressed.  No physical exam was performed (except for noted visual exam findings with Video Visits).   I connected wit Bryan [name] today at 11:00 AM EDT by a video enabled telemedicine application and verified that I am speaking with the correct person using two identifiers. Location patient: home Location provider: [facility] Persons participating in the virtual visit: patient, provider  I discussed the limitations, risks, security and privacy concerns of performing an evaluation and management service by video and the availability of in person appointments. I also discussed with the patient that there may be a patient responsible charge related to this service. The patient expressed understanding and agreed to proceed.  HPI:  Patient and I connected via video due to complaint of night sweats that occurred 2 days ago.  Patient states he woke up feeling sweaty and chilled.  He got up and checked his temperature and it was 97.8.  Patient states since 2 days ago he has not had another episode of the night sweats.  Patient does report he was having issues with night sweats about 6 months ago, but they resolved on their own.   He denies any fever.  Denies cough, shortness of breath or wheezing.  Denies body aches.  Denies GI or GU complaints.  Patient does not go many places other than doctors appointments and to the grocery store.  He has been diligent wearing his mask and doing good handwashing.  Patient also wondering if he can have refill on trazodone, has used this with success to help with insomnia.  Feels some  of his insomnia has gotten worse ever since the passing of his wife and does like that the trazodone is quite helpful to him with sleep.  States trazodone allows him to get good rest.  Denies any SI or HI.  CBC and basic metabolic panel done by urology reviewed by me and appear stable.  ROS: See pertinent positives and negatives per HPI.  Past Medical History:  Diagnosis Date  . Abnormal CT scan    Asymmetric left rectal wall thickening   . Anemia   . Anxiety   . Arrhythmia   . Chronic lower back pain   . Complication of anesthesia    Memory loss 09/2015  . Coronary artery disease   . Depression   . GERD (gastroesophageal reflux disease)   . Glaucoma   . H/O thoracic aortic aneurysm repair   . Heart murmur   . History of being hospitalized    memory lose kidney funtion down blood pressure up  . History of blood transfusion    "think he had one when he had heart valve OR" (10/14/2016); "none since" (10/19/2017)  . History of chicken pox   . Hyperlipidemia   . Hypertension   . Lumbar stenosis   . Murmur   . Neuropathy   . Osteoarthritis    worse in feet and ankles  . Paroxysmal A-fib ([place])   . Poor short term memory    takes Aricept  . Rheumatoid arthritis ([place])    "all over" (10/14/2016)  . Schizophrenia ([place])   .  Seasonal allergies   . Valvular heart disease     Past Surgical History:  Procedure Laterality Date  . AORTIC VALVE REPLACEMENT  2007   [facility].  Supply, [place]; "pig valve"  . APPENDECTOMY  2010  . BACK SURGERY    . CARDIAC VALVE REPLACEMENT    . CATARACT EXTRACTION W/ INTRAOCULAR LENS  IMPLANT, BILATERAL Bilateral 2012  . COLONOSCOPY WITH PROPOFOL N/A 09/24/2016   Procedure: COLONOSCOPY WITH PROPOFOL;  Surgeon: Jonathon Bellows, MD;  Location[facility] END[facility]COPY;  Service: Endoscopy;  Laterality: N/A;  . ESOPHAGOGASTRODUODENOSCOPY (EGD) WITH PROPOFOL N/A 09/24/2016   Procedure: ESOPHAGOGASTRODUODENOSCOPY (EGD) WITH PROPOFOL;  Surgeon: Jonathon Bellows, MD;  Location:  [facility] ENDOSCOPY;  Service: Endoscopy;  Laterality: N/A;  . GIVENS CAPSULE STUDY N/A 09/24/2016   Procedure: GIVENS CAPSULE STUDY;  Surgeon: Jonathon Bellows, MD;  Location: [facility] ENDOSCOPY;  Service: Endoscopy;  Laterality: N/A;  . HAMMER TOE SURGERY Right 10/09/2015   Procedure: HAMMER TOE REPAIR WITH K-WIRE FIXATION RIGHT SECOND TOE;  Surgeon: Albertine Patricia, DPM;  Location: [place];  Service: Podiatry;  Laterality: Right;  WITH LOCAL  . INGUINAL HERNIA REPAIR Right 05/05/2018   Medium Bard PerFix plug.  Surgeon: Robert Bellow, MD;  Location: [facility] ORS;  Service: General;  Laterality: Right;  . IR VERTEBROPLASTY CERV/THOR BX INC UNI/BIL INC/INJECT/IMAGING  10/27/2018  . JOINT REPLACEMENT Left    left total knee  . LAMINECTOMY WITH POSTERIOR LATERAL ARTHRODESIS LEVEL 3 N/A 09/28/2017   Procedure: LUMBAR  POSTEROR  FUSION REVISION - LUMBAR ONE -TWO, LUMBAR TWO -THREE,THREE-FOUR ,BILATERAL ARTHRODESIS REMOVAL LUMBAR THREE HARDWARE;  Surgeon: Kary Kos, MD;  Location: [place];  Service: Neurosurgery;  Laterality: N/A;  . LAMINECTOMY WITH POSTERIOR LATERAL ARTHRODESIS LEVEL 3 N/A 10/20/2017   Procedure: Revision fusion and removal of hardware Lumbar one, Pedicle screw fixation thoracic ten-lumbar two, thoracic nine-ten laminotomy, Posterior Lumbar Arthrodesis thoracic ten-lumbar two ;  Surgeon: Kary Kos, MD;  Location: [place];  Service: Neurosurgery;  Laterality: N/A;  . LAPAROSCOPIC CHOLECYSTECTOMY  2010  . LUMBAR FUSION Right 06/08/2017   LUMBAR THREE-FOUR, LUMBAR FOUR-FIVE POSTEROLATERAL ARTHRODESIS WITH RIGHT LUMBAR FOUR-FIVE LAMINECTOMY/FORAMINOTOMY  . LUMBAR LAMINECTOMY/DECOMPRESSION MICRODISCECTOMY Right 03/08/2016   Procedure: Laminectomy and Foraminotomy - Lumbar Five-Sacral One Right;  Surgeon: Kary Kos, MD;  Location: [place];  Service: Neurosurgery;  Laterality: Right;  Right  . MULTIPLE TOOTH EXTRACTIONS     "3"  . POSTERIOR LUMBAR FUSION  10/14/2016   L5-S1  . REPLACEMENT TOTAL  KNEE Left 2003  . THORACIC AORTIC ANEURYSM REPAIR  2010    Family History  Problem Relation Age of Onset  . Heart failure Mother   . Hypertension Mother   . Asthma Mother   . Osteoporosis Mother   . Heart attack Father 20       MI  . Hypertension Sister   . Prostate cancer Neg Hx   . Bladder Cancer Neg Hx   . Kidney cancer Neg Hx   . Colon cancer Neg Hx    Social History   Tobacco Use  . Smoking status: Former Smoker    Packs/day: 1.00    Years: 32.00    Pack years: 32.00    Types: Cigarettes    Quit date: 07/05/1981    Years since quitting: 37.3  . Smokeless tobacco: Never Used  Substance Use Topics  . Alcohol use: Yes    Comment: 10/19/2017 "glass of wine/month; if that"    Current Outpatient Medications:  .  amLODipine (  NORVASC) 10 MG tablet, Take 1 tablet (10 mg total) by mouth daily., Disp: 180 tablet, Rfl: 3 .  aspirin 325 MG EC tablet, Take 325 mg by mouth every evening. , Disp: , Rfl:  .  atorvastatin (LIPITOR) 40 MG tablet, TAKE ONE TABLET BY MOUTH EVERY DAY (Patient taking differently: Take 40 mg by mouth at bedtime. ), Disp: 90 tablet, Rfl: 3 .  Calcium Carbonate (CALCARB 600 PO), Take 600 mg by mouth daily., Disp: , Rfl:  .  cetirizine (ZYRTEC) 10 MG tablet, Take 10 mg by mouth daily., Disp: , Rfl:  .  cholecalciferol (VITAMIN D3) 25 MCG (1000 UT) tablet, Take 1,000 Units by mouth daily., Disp: , Rfl:  .  fenofibrate 160 MG tablet, Take 1 tablet (160 mg total) by mouth at bedtime., Disp: 90 tablet, Rfl: 3 .  FLUoxetine (PROZAC) 20 MG capsule, TAKE 1 CAPSULE (20MG ) BY MOUTH EVERY DAY (Patient taking differently: Take 20 mg by mouth daily. ), Disp: 90 capsule, Rfl: 1 .  Glycerin-Hypromellose-PEG 400 (ARTIFICIAL TEARS) 0.2-0.2-1 % SOLN, Place 1 drop into both eyes daily as needed (dry eyes)., Disp: , Rfl:  .  HYDROcodone-acetaminophen (NORCO) 5-325 MG tablet, Take 1 tablet by mouth every 4 (four) hours as needed for moderate pain., Disp: 15 tablet, Rfl: 0 .   hydrocortisone cream 1 %, Apply 1 application topically daily as needed for itching., Disp: , Rfl:  .  hydroxychloroquine (PLAQUENIL) 200 MG tablet, Take 200 mg by mouth every other day. , Disp: , Rfl:  .  ibandronate (BONIVA) 150 MG tablet, Take 1 tablet (150 mg total) by mouth every 30 (thirty) days. Take with a glass of water, on an empty stomach.  Stay upright for 30 min., Disp: 3 tablet, Rfl: 3 .  lisinopril (PRINIVIL,ZESTRIL) 20 MG tablet, Take 1 tablet (20 mg total) by mouth every evening., Disp: 90 tablet, Rfl: 3 .  Omeprazole 20 MG TBEC, Take 1 tablet (20 mg total) by mouth daily., Disp: 30 each, Rfl: 11 .  testosterone cypionate (DEPOTESTOSTERONE CYPIONATE) 200 MG/ML injection, INJECT 15mL INTO THE MUSCLE EVERY 14 DAYS (Patient taking differently: Inject 100 mg into the muscle every 14 (fourteen) days. ), Disp: 10 mL, Rfl: 0 .  traZODone (DESYREL) 50 MG tablet, Take 1 or 2 tablet at bedtime prior to sleep as needed., Disp: 30 tablet, Rfl: 2 .  vitamin B-12 (CYANOCOBALAMIN) 1000 MCG tablet, Take 1,000 mcg by mouth daily., Disp: , Rfl:   EXAM:  GENERAL: alert, oriented, appears well and in no acute distress  HEENT: atraumatic, conjunttiva clear, no obvious abnormalities on inspection of external nose and ears  NECK: normal movements of the head and neck  LUNGS: on inspection no signs of respiratory distress, breathing rate appears normal, no obvious gross SOB, gasping or wheezing  CV: no obvious cyanosis  MS: moves all visible extremities without noticeable abnormality  PSYCH/NEURO: pleasant and cooperative, no obvious depression or anxiety, speech and thought processing grossly intact  ASSESSMENT AND PLAN:  Discussed the following assessment and plan:  Episode of night sweats x1, 2 days ago- due to this occurring 1 time and seeming to go away it potentially could have been an episode of chills.  Patient would like to be tested for coronavirus to rule this out.  If COVID-19  testing is negative we will move forward with either testing including having patient come into office for lab work.  Encourage patient to monitor self for any changing symptoms such as development of fever,  more episodes of chills or night sweats, chest pain, cough, shortness of breath or wheezing, body aches GI/GU issues and to call office right away if anything new develops.  Insomnia-patient has had success with use of trazodone in the past.  Medication renewed for patient.   I discussed the assessment and treatment plan with the patient. The patient was provided an opportunity to ask questions and all were answered. The patient agreed with the plan and demonstrated an understanding of the instructions.   The patient was advised to call back or seek an in-person evaluation if the symptoms worsen or if the condition fails to improve as anticipated.  Once we have results of COVID-19 testing we will better be able to move forward with further testing to evaluate night sweats.  15 minutes spent over video call with patient discussing sweat soda, plan for COVID testing, plan for future lab work once we have all the testing results back also discussing use of trazodone to treat insomnia.  Jodelle Green, FNP

## 2018-11-16 NOTE — Telephone Encounter (Signed)
Bryan [name]  1940/05/31  [id number]  Needs covid test due to having night sweats  Medicare & [employer] [phone]

## 2018-11-16 NOTE — Telephone Encounter (Signed)
Called Pt and he stated his visit was scheduled for a Doxy visit today at 11:00am.

## 2018-11-16 NOTE — Telephone Encounter (Signed)
Need more info about night sweats --- is it fever/chills?  Is this a doxy visit?

## 2018-11-17 ENCOUNTER — Encounter: Payer: Self-pay | Admitting: Family Medicine

## 2018-11-18 ENCOUNTER — Encounter: Payer: Self-pay | Admitting: Family Medicine

## 2018-11-20 LAB — NOVEL CORONAVIRUS, NAA: SARS-CoV-2, NAA: NOT DETECTED

## 2018-11-20 NOTE — Telephone Encounter (Signed)
No covid results in chart yet --- can we call the lab to see if they have his results yet?  Thanks!  LG

## 2018-11-22 ENCOUNTER — Encounter: Payer: Self-pay | Admitting: Lab

## 2018-12-07 DIAGNOSIS — S32009K Unspecified fracture of unspecified lumbar vertebra, subsequent encounter for fracture with nonunion: Secondary | ICD-10-CM | POA: Diagnosis not present

## 2018-12-11 ENCOUNTER — Ambulatory Visit (INDEPENDENT_AMBULATORY_CARE_PROVIDER_SITE_OTHER): Payer: Medicare Other

## 2018-12-11 ENCOUNTER — Other Ambulatory Visit: Payer: Self-pay

## 2018-12-11 DIAGNOSIS — Z0000 Encounter for general adult medical examination without abnormal findings: Secondary | ICD-10-CM

## 2018-12-11 NOTE — Progress Notes (Signed)
Subjective:   Bryan [name] is a 78 y.o. male who presents for Medicare Annual/Subsequent preventive examination.  Review of Systems:  No ROS.  Medicare Wellness Virtual Visit.  Visual/audio telehealth visit, UTA vital signs.   See social history for additional risk factors.   Cardiac Risk Factors include: advanced age (>4men, >64 women);male gender;hypertension     Objective:    Vitals: There were no vitals taken for this visit.  There is no height or weight on file to calculate BMI.  Advanced Directives 12/11/2018 10/27/2018 05/05/2018 04/27/2018 01/27/2018 12/16/2017 11/17/2017  Does Patient Have a Medical Advance Directive? Yes Yes Yes Yes Yes Yes Yes  Type of Paramedic of [place];Living will [place];Living will [place];Living will [place];Living will - [place];Living will -  Does patient want to make changes to medical advance directive? No - Patient declined No - Patient declined No - Patient declined No - Patient declined - - -  Copy of [place] in Chart? Yes - validated most recent copy scanned in chart (See row information) No - copy requested No - copy requested No - copy requested - Yes -    Tobacco Social History   Tobacco Use  Smoking Status Former Smoker   Packs/day: 1.00   Years: 32.00   Pack years: 32.00   Types: Cigarettes   Quit date: 07/05/1981   Years since quitting: 37.4  Smokeless Tobacco Never Used     Counseling given: Not Answered   Clinical Intake:  Pre-visit preparation completed: Yes        Diabetes: No  How often do you need to have someone help you when you read instructions, pamphlets, or other written materials from your doctor or pharmacy?: 1 - Never  Interpreter Needed?: No     Past Medical History:  Diagnosis Date   Abnormal CT scan    Asymmetric left rectal wall thickening    Anemia      Anxiety    Arrhythmia    Chronic lower back pain    Complication of anesthesia    Memory loss 09/2015   Coronary artery disease    Depression    GERD (gastroesophageal reflux disease)    Glaucoma    H/O thoracic aortic aneurysm repair    Heart murmur    History of being hospitalized    memory lose kidney funtion down blood pressure up   History of blood transfusion    "think he had one when he had heart valve OR" (10/14/2016); "none since" (10/19/2017)   History of chicken pox    Hyperlipidemia    Hypertension    Lumbar stenosis    Murmur    Neuropathy    Osteoarthritis    worse in feet and ankles   Paroxysmal A-fib ([place])    Poor short term memory    takes Aricept   Rheumatoid arthritis ([place])    "all over" (10/14/2016)   Schizophrenia ([place])    Seasonal allergies    Valvular heart disease    Past Surgical History:  Procedure Laterality Date   AORTIC VALVE REPLACEMENT  2007   [facility]  [place], [place]; "pig valve"   APPENDECTOMY  2010   BACK SURGERY     CARDIAC VALVE REPLACEMENT     CATARACT EXTRACTION W/ INTRAOCULAR LENS  IMPLANT, BILATERAL Bilateral 2012   COLONOSCOPY WITH PROPOFOL N/A 09/24/2016   Procedure: COLONOSCOPY WITH PROPOFOL;  Surgeon: Jonathon Bellows, MD;  Location: [facility] ENDOSCOPY;  Service: Endoscopy;  Laterality: N/A;   ESOPHAGOGASTRODUODENOSCOPY (EGD) WITH PROPOFOL N/A 09/24/2016   Procedure: ESOPHAGOGASTRODUODENOSCOPY (EGD) WITH PROPOFOL;  Surgeon: Jonathon Bellows, MD;  Location: [facility] ENDOSCOPY;  Service: Endoscopy;  Laterality: N/A;   GIVENS CAPSULE STUDY N/A 09/24/2016   Procedure: GIVENS CAPSULE STUDY;  Surgeon: Jonathon Bellows, MD;  Location[facility] ENDOSCOPY;  Service: Endoscopy;  Laterality: N/A;   HAMMER TOE SURGERY Right 10/09/2015   Procedure: HAMMER TOE REPAIR WITH K-WIRE FIXATION RIGHT SECOND TOE;  Surgeon: Albertine Patricia, DPM;  Location: [place];  Service: Podiatry;  Laterality: Right;  WITH LOCAL   INGUINAL  HERNIA REPAIR Right 05/05/2018   Medium Bard PerFix plug.  Surgeon: Robert Bellow, MD;  Location: [facility] ORS;  Service: General;  Laterality: Right;   IR VERTEBROPLASTY CERV/THOR BX INC UNI/BIL INC/INJECT/IMAGING  10/27/2018   JOINT REPLACEMENT Left    left total knee   LAMINECTOMY WITH POSTERIOR LATERAL ARTHRODESIS LEVEL 3 N/A 09/28/2017   Procedure: LUMBAR  POSTEROR  FUSION REVISION - LUMBAR ONE -TWO, LUMBAR TWO -THREE,THREE-FOUR ,BILATERAL ARTHRODESIS REMOVAL LUMBAR THREE HARDWARE;  Surgeon: Kary Kos, MD;  Location: [place];  Service: Neurosurgery;  Laterality: N/A;   LAMINECTOMY WITH POSTERIOR LATERAL ARTHRODESIS LEVEL 3 N/A 10/20/2017   Procedure: Revision fusion and removal of hardware Lumbar one, Pedicle screw fixation thoracic ten-lumbar two, thoracic nine-ten laminotomy, Posterior Lumbar Arthrodesis thoracic ten-lumbar two ;  Surgeon: Kary Kos, MD;  Location: [place];  Service: Neurosurgery;  Laterality: N/A;   LAPAROSCOPIC CHOLECYSTECTOMY  2010   LUMBAR FUSION Right 06/08/2017   LUMBAR THREE-FOUR, LUMBAR FOUR-FIVE POSTEROLATERAL ARTHRODESIS WITH RIGHT LUMBAR FOUR-FIVE LAMINECTOMY/FORAMINOTOMY   LUMBAR LAMINECTOMY/DECOMPRESSION MICRODISCECTOMY Right 03/08/2016   Procedure: Laminectomy and Foraminotomy - Lumbar Five-Sacral One Right;  Surgeon: Kary Kos, MD;  Location: [place];  Service: Neurosurgery;  Laterality: Right;  Right   MULTIPLE TOOTH EXTRACTIONS     "3"   POSTERIOR LUMBAR FUSION  10/14/2016   L5-S1   REPLACEMENT TOTAL KNEE Left 2003   THORACIC AORTIC ANEURYSM REPAIR  2010   Family History  Problem Relation Age of Onset   Heart failure Mother    Hypertension Mother    Asthma Mother    Osteoporosis Mother    Heart attack Father 22       MI   Hypertension Sister    Prostate cancer Neg Hx    Bladder Cancer Neg Hx    Kidney cancer Neg Hx    Colon cancer Neg Hx    Social History   Socioeconomic History   Marital status: Widowed    Spouse name: Not  on file   Number of children: Not on file   Years of education: Not on file   Highest education level: Not on file  Occupational History   Occupation: retired  Scientist, product/process development strain: Not hard at all   Food insecurity    Worry: Never true    Inability: Never true   Transportation needs    Medical: No    Non-medical: No  Tobacco Use   Smoking status: Former Smoker    Packs/day: 1.00    Years: 32.00    Pack years: 32.00    Types: Cigarettes    Quit date: 07/05/1981    Years since quitting: 37.4   Smokeless tobacco: Never Used  Substance and Sexual Activity   Alcohol use: Yes    Comment: 10/19/2017 "glass of wine/month; if that"  Drug use: Never   Sexual activity: Not Currently  Lifestyle   Physical activity    Days per week: Not on file    Minutes per session: Not on file   Stress: Not at all  Relationships   Social connections    Talks on phone: Not on file    Gets together: Not on file    Attends religious service: Not on file    Active member of club or organization: Not on file    Attends meetings of clubs or organizations: Not on file    Relationship status: Not on file  Other Topics Concern   Not on file  Social History Narrative   Not on file    Outpatient Encounter Medications as of 12/11/2018  Medication Sig   amLODipine (NORVASC) 10 MG tablet Take 1 tablet (10 mg total) by mouth daily.   aspirin 325 MG EC tablet Take 325 mg by mouth every evening.    atorvastatin (LIPITOR) 40 MG tablet TAKE ONE TABLET BY MOUTH EVERY DAY (Patient taking differently: Take 40 mg by mouth at bedtime. )   Calcium Carbonate (CALCARB 600 PO) Take 600 mg by mouth daily.   cetirizine (ZYRTEC) 10 MG tablet Take 10 mg by mouth daily.   cholecalciferol (VITAMIN D3) 25 MCG (1000 UT) tablet Take 1,000 Units by mouth daily.   fenofibrate 160 MG tablet Take 1 tablet (160 mg total) by mouth at bedtime.   FLUoxetine (PROZAC) 20 MG capsule TAKE 1  CAPSULE (20MG ) BY MOUTH EVERY DAY (Patient taking differently: Take 20 mg by mouth daily. )   Glycerin-Hypromellose-PEG 400 (ARTIFICIAL TEARS) 0.2-0.2-1 % SOLN Place 1 drop into both eyes daily as needed (dry eyes).   HYDROcodone-acetaminophen (NORCO) 5-325 MG tablet Take 1 tablet by mouth every 4 (four) hours as needed for moderate pain.   hydrocortisone cream 1 % Apply 1 application topically daily as needed for itching.   hydroxychloroquine (PLAQUENIL) 200 MG tablet Take 200 mg by mouth every other day.    ibandronate (BONIVA) 150 MG tablet Take 1 tablet (150 mg total) by mouth every 30 (thirty) days. Take with a glass of water, on an empty stomach.  Stay upright for 30 min.   lisinopril (PRINIVIL,ZESTRIL) 20 MG tablet Take 1 tablet (20 mg total) by mouth every evening.   Omeprazole 20 MG TBEC Take 1 tablet (20 mg total) by mouth daily.   testosterone cypionate (DEPOTESTOSTERONE CYPIONATE) 200 MG/ML injection INJECT 39mL INTO THE MUSCLE EVERY 14 DAYS (Patient taking differently: Inject 100 mg into the muscle every 14 (fourteen) days. )   traZODone (DESYREL) 50 MG tablet Take 1 or 2 tablet at bedtime prior to sleep as needed.   vitamin B-12 (CYANOCOBALAMIN) 1000 MCG tablet Take 1,000 mcg by mouth daily.   No facility-administered encounter medications on file as of 12/11/2018.     Activities of Daily Living In your present state of health, do you have any difficulty performing the following activities: 12/11/2018 10/27/2018  Hearing? N N  Vision? N N  Difficulty concentrating or making decisions? N N  Walking or climbing stairs? Y N  Comment Unsteady gait. Cane/walker in use when ambulating. -  Dressing or bathing? N N  Doing errands, shopping? N -  Preparing Food and eating ? Y -  Using the Toilet? N -  In the past six months, have you accidently leaked urine? Y -  Comment Managed with daily depend brief. Followed by Urology, Natasha Mead. -  Do you  have problems with loss of  bowel control? N -  Managing your Medications? N -  Managing your Finances? N -  Housekeeping or managing your Housekeeping? Y -  Some recent data might be hidden    Patient Care Team: [name], MD as PCP - General (Family Medicine) Minna Merritts, MD as Consulting Physician (Cardiology) Emmaline Kluver., MD (Rheumatology) Kary Kos, MD as Consulting Physician (Neurosurgery)   Assessment:   This is a routine wellness examination for [place].  I connected with patient 12/11/18 at 11:00 AM EDT by an audio enabled telemedicine application and verified that I am speaking with the correct person using two identifiers. Patient stated full name and DOB. Patient gave permission to continue with virtual visit. Patient's location was at home and Nurse's location was at [place] office.   Spot checks blood pressure and notes wnl.   Physical therapy to begin this week.   Health Screenings  Colonoscopy - 09/2016 Bone Density - 03/2018 Glaucoma -none Hearing -demonstrates normal hearing during visit. Labs followed by pcp PSA- 09/2018 Dental- [facility]- visits within the last 12 months.  Social  Alcohol intake - no      Smoking history- former  Smokers in home? none Illicit drug use? none Exercise - no routine Diet - regular Sexually Active -not currently BMI- discussed the importance of a healthy diet, water intake and the benefits of aerobic exercise.  Educational material provided.   Safety  Patient feels safe at home- no Error in typing. Revised. Patient does feel safe in the home.  Patient does have smoke detectors at home- yes Patient does wear sunscreen or protective clothing when in direct sunlight -yes Patient does wear seat belt when in a moving vehicle -yes Patient drives- yes Adequate lighting in the hallway- yes Walkways free of throw rugs, extention cords, etc -yes Handrails in use when available- yes  Covid-19 precautions and sickness symptoms  discussed.   Activities of Daily Living Patient denies needing assistance with: driving, feeding themselves, getting from bed to chair, getting to the toilet, bathing/showering, dressing and managing money.   Sisters assist with household chores and preparing meals.   Depression Screen Patient denies losing interest in daily life, feeling hopeless, or crying easily over simple problems.   Medication-taking as directed and without issues.   Fall Screen Patient denies being afraid of falling or falling in the last year.  Ambulates with cane/walker.   Memory Screen Patient is alert.  Patient denies difficulty focusing, concentrating or misplacing items. Correctly identified the president of the [place], season and recall.  Immunizations The following Immunizations were discussed: Influenza, shingles, pneumonia, and tetanus.   Other Providers Patient Care Team: [name], MD as PCP - General (Family Medicine) Minna Merritts, MD as Consulting Physician (Cardiology) Emmaline Kluver., MD (Rheumatology) Kary Kos, MD as Consulting Physician (Neurosurgery)  Exercise Activities and Dietary recommendations Current Exercise Habits: The patient does not participate in regular exercise at present  Goals      Patient Stated    Increase physical activity (pt-stated)     Physical therapy sessions       Fall Risk Fall Risk  12/11/2018 05/11/2018 04/18/2018 04/11/2018 12/23/2017  Falls in the past year? 0 0 0 0 -  Comment - - - Emmi Telephone Survey: data to providers prior to load -  Injury with Fall? - 0 - - -  Risk for fall due to : - - - - Impaired  balance/gait  Follow up - - Falls evaluation completed - -   Depression Screen PHQ 2/9 Scores 12/11/2018 07/06/2016 01/19/2016 11/28/2015  PHQ - 2 Score 0 0 0 0  Exception Documentation - - - -    Cognitive Function MMSE - Mini Mental State Exam 07/06/2016 06/27/2015  Orientation to time 5 5  Orientation to Place 5 5    Registration 3 3  Attention/ Calculation 5 5  Recall 3 3  Language- name 2 objects 2 2  Language- repeat 1 1  Language- follow 3 step command 3 3  Language- read & follow direction 1 1  Write a sentence 1 1  Copy design 1 1  Total score 30 30     6CIT Screen 12/11/2018  What Year? 0 points  What month? 0 points  What time? 0 points  Count back from 20 0 points  Months in reverse 0 points  Repeat phrase 0 points  Total Score 0    Immunization History  Administered Date(s) Administered   Hepatitis A 06/24/2017   Influenza Split 04/09/2014   Influenza, High Dose Seasonal PF 05/09/2015, 02/23/2016, 04/28/2017, 03/18/2018   PPD Test 01/10/2017   Pneumococcal Conjugate-13 12/13/2013   Pneumococcal Polysaccharide-23 05/09/2015   Tdap 12/13/2013   Screening Tests Health Maintenance  Topic Date Due   INFLUENZA VACCINE  12/23/2018   COLONOSCOPY  09/24/2021   TETANUS/TDAP  12/14/2023   PNA vac Low Risk Adult  Completed      Plan:    End of life planning; Advance aging; Advanced directives discussed.  Copy of current HCPOA/Living Will on file.    I have personally reviewed and noted the following in the patients chart:    Medical and social history  Use of alcohol, tobacco or illicit drugs   Current medications and supplements  Functional ability and status  Nutritional status  Physical activity  Advanced directives  List of other physicians  Hospitalizations, surgeries, and ER visits in previous 12 months  Vitals  Screenings to include cognitive, depression, and falls  Referrals and appointments  In addition, I have reviewed and discussed with patient certain preventive protocols, quality metrics, and best practice recommendations. A written personalized care plan for preventive services as well as general preventive health recommendations were provided to patient.     Varney Biles, LPN  3/71/0626

## 2018-12-11 NOTE — Progress Notes (Signed)
I have reviewed the above note and agree. Can you clarify why the patient does not feel safe at home as listed in the note?   [name], M.D.

## 2018-12-11 NOTE — Patient Instructions (Addendum)
  [name] , Thank you for taking time to come for your Medicare Wellness Visit. I appreciate your ongoing commitment to your health goals. Please review the following plan we discussed and let me know if I can assist you in the future.   These are the goals we discussed: Goals      Patient Stated   . Increase physical activity (pt-stated)     Physical therapy sessions       This is a list of the screening recommended for you and due dates:  Health Maintenance  Topic Date Due  . Flu Shot  12/23/2018  . Colon Cancer Screening  09/24/2021  . Tetanus Vaccine  12/14/2023  . Pneumonia vaccines  Completed

## 2018-12-18 ENCOUNTER — Other Ambulatory Visit: Payer: Medicare Other

## 2018-12-19 ENCOUNTER — Other Ambulatory Visit: Payer: Self-pay

## 2018-12-19 ENCOUNTER — Inpatient Hospital Stay: Payer: Medicare Other | Attending: Hematology and Oncology

## 2018-12-19 DIAGNOSIS — D509 Iron deficiency anemia, unspecified: Secondary | ICD-10-CM | POA: Diagnosis not present

## 2018-12-19 DIAGNOSIS — Z79899 Other long term (current) drug therapy: Secondary | ICD-10-CM | POA: Insufficient documentation

## 2018-12-19 DIAGNOSIS — D696 Thrombocytopenia, unspecified: Secondary | ICD-10-CM | POA: Insufficient documentation

## 2018-12-19 DIAGNOSIS — M069 Rheumatoid arthritis, unspecified: Secondary | ICD-10-CM | POA: Diagnosis not present

## 2018-12-19 LAB — IRON AND TIBC
Iron: 113 ug/dL (ref 45–182)
Saturation Ratios: 24 % (ref 17.9–39.5)
TIBC: 462 ug/dL — ABNORMAL HIGH (ref 250–450)
UIBC: 349 ug/dL

## 2018-12-19 LAB — CBC WITH DIFFERENTIAL/PLATELET
Abs Immature Granulocytes: 0.02 10*3/uL (ref 0.00–0.07)
Basophils Absolute: 0 10*3/uL (ref 0.0–0.1)
Basophils Relative: 1 %
Eosinophils Absolute: 0.1 10*3/uL (ref 0.0–0.5)
Eosinophils Relative: 2 %
HCT: 37.4 % — ABNORMAL LOW (ref 39.0–52.0)
Hemoglobin: 12.4 g/dL — ABNORMAL LOW (ref 13.0–17.0)
Immature Granulocytes: 1 %
Lymphocytes Relative: 18 %
Lymphs Abs: 0.7 10*3/uL (ref 0.7–4.0)
MCH: 31.8 pg (ref 26.0–34.0)
MCHC: 33.2 g/dL (ref 30.0–36.0)
MCV: 95.9 fL (ref 80.0–100.0)
Monocytes Absolute: 0.4 10*3/uL (ref 0.1–1.0)
Monocytes Relative: 9 %
Neutro Abs: 2.8 10*3/uL (ref 1.7–7.7)
Neutrophils Relative %: 69 %
Platelets: 128 10*3/uL — ABNORMAL LOW (ref 150–400)
RBC: 3.9 MIL/uL — ABNORMAL LOW (ref 4.22–5.81)
RDW: 13.7 % (ref 11.5–15.5)
WBC: 4 10*3/uL (ref 4.0–10.5)
nRBC: 0 % (ref 0.0–0.2)

## 2018-12-19 LAB — SEDIMENTATION RATE: Sed Rate: 4 mm/hr (ref 0–20)

## 2018-12-19 LAB — FERRITIN: Ferritin: 90 ng/mL (ref 24–336)

## 2018-12-20 ENCOUNTER — Inpatient Hospital Stay (HOSPITAL_BASED_OUTPATIENT_CLINIC_OR_DEPARTMENT_OTHER): Payer: Medicare Other | Admitting: Hematology and Oncology

## 2018-12-20 ENCOUNTER — Encounter: Payer: Self-pay | Admitting: Hematology and Oncology

## 2018-12-20 VITALS — BP 151/71 | HR 57 | Temp 97.6°F | Resp 18 | Ht 72.0 in | Wt 165.0 lb

## 2018-12-20 DIAGNOSIS — D696 Thrombocytopenia, unspecified: Secondary | ICD-10-CM

## 2018-12-20 DIAGNOSIS — D509 Iron deficiency anemia, unspecified: Secondary | ICD-10-CM

## 2018-12-20 DIAGNOSIS — E60 Dietary zinc deficiency: Secondary | ICD-10-CM

## 2018-12-20 DIAGNOSIS — R5383 Other fatigue: Secondary | ICD-10-CM

## 2018-12-20 NOTE — Progress Notes (Signed)
Patient c/o mid back pain ( pain level 5)

## 2018-12-20 NOTE — Progress Notes (Deleted)
[facility]  [address] [place], [place] [address] Phone: [phone]  Fax: [phone]   Clinic Day:  12/20/2018  Referring physician: [name], MD  Chief Complaint: Bryan [name] is a 78 y.o. male with rheumatoid arthritis, iron deficiency anemia, and mild pancytopenia who is seen for 10 month assessment.  HPI: The patient was last seen in the hematology clinic on 01/27/2018. At that time, he remained tired. Exam was stable.  Platelet count 117,000.  He was seen in Urology on 04/12/2018 and 08/02/2017 by Zara Council, PA for testosterone deficiency.  He was seen in surgery by Dr. Bary Castilla on 04/18/2018 for a right inguinal hernia. He underwent a hernia repair on 05/02/2018.   Labs on 12/19/2018 included a hematocrit of 37.4, hemoglobin 12.4, MCV 95.9, platelets 128,000, white count 4000 with an ANC 2800.  Ferritin was 90 with this iron saturation of 24% and a TIBC of 462.  Sed rate was 4.  During the interim, ***   Past Medical History:  Diagnosis Date  . Abnormal CT scan    Asymmetric left rectal wall thickening   . Anemia   . Anxiety   . Arrhythmia   . Chronic lower back pain   . Complication of anesthesia    Memory loss 09/2015  . Coronary artery disease   . Depression   . GERD (gastroesophageal reflux disease)   . Glaucoma   . H/O thoracic aortic aneurysm repair   . Heart murmur   . History of being hospitalized    memory lose kidney funtion down blood pressure up  . History of blood transfusion    "think he had one when he had heart valve OR" (10/14/2016); "none since" (10/19/2017)  . History of chicken pox   . Hyperlipidemia   . Hypertension   . Lumbar stenosis   . Murmur   . Neuropathy   . Osteoarthritis    worse in feet and ankles  . Paroxysmal A-fib ([place])   . Poor short term memory    takes Aricept  . Rheumatoid arthritis ([place])    "all over" (10/14/2016)  . Schizophrenia ([place])   . Seasonal  allergies   . Valvular heart disease     Past Surgical History:  Procedure Laterality Date  . AORTIC VALVE REPLACEMENT  2007   [facility].  [place]; "pig valve"  . APPENDECTOMY  2010  . BACK SURGERY    . CARDIAC VALVE REPLACEMENT    . CATARACT EXTRACTION W/ INTRAOCULAR LENS  IMPLANT, BILATERAL Bilateral 2012  . COLONOSCOPY WITH PROPOFOL N/A 09/24/2016   Procedure: COLONOSCOPY WITH PROPOFOL;  Surgeon: Jonathon Bellows, MD;  Location: [facility] ENDOSCOPY;  Service: Endoscopy;  Laterality: N/A;  . ESOPHAGOGASTRODUODENOSCOPY (EGD) WITH PROPOFOL N/A 09/24/2016   Procedure: ESOPHAGOGASTRODUODENOSCOPY (EGD) WITH PROPOFOL;  Surgeon: Jonathon Bellows, MD;  Location: [facility] ENDOSCOPY;  Service: Endoscopy;  Laterality: N/A;  . GIVENS CAPSULE STUDY N/A 09/24/2016   Procedure: GIVENS CAPSULE STUDY;  Surgeon: Jonathon Bellows, MD;  Location: [facility] ENDOSCOPY;  Service: Endoscopy;  Laterality: N/A;  . HAMMER TOE SURGERY Right 10/09/2015   Procedure: HAMMER TOE REPAIR WITH K-WIRE FIXATION RIGHT SECOND TOE;  Surgeon: Albertine Patricia, DPM;  Location: [place];  Service: Podiatry;  Laterality: Right;  WITH LOCAL  . INGUINAL HERNIA REPAIR Right 05/05/2018   Medium Bard PerFix plug.  Surgeon: Robert Bellow, MD;  Location: [facility] ORS;  Service: General;  Laterality: Right;  . IR VERTEBROPLASTY CERV/THOR BX INC  UNI/BIL INC/INJECT/IMAGING  10/27/2018  . JOINT REPLACEMENT Left    left total knee  . LAMINECTOMY WITH POSTERIOR LATERAL ARTHRODESIS LEVEL 3 N/A 09/28/2017   Procedure: LUMBAR  POSTEROR  FUSION REVISION - LUMBAR ONE -TWO, LUMBAR TWO -THREE,THREE-FOUR ,BILATERAL ARTHRODESIS REMOVAL LUMBAR THREE HARDWARE;  Surgeon: Kary Kos, MD;  Location: [place];  Service: Neurosurgery;  Laterality: N/A;  . LAMINECTOMY WITH POSTERIOR LATERAL ARTHRODESIS LEVEL 3 N/A 10/20/2017   Procedure: Revision fusion and removal of hardware Lumbar one, Pedicle screw fixation thoracic ten-lumbar two, thoracic nine-ten laminotomy, Posterior  Lumbar Arthrodesis thoracic ten-lumbar two ;  Surgeon: Kary Kos, MD;  Location: [place];  Service: Neurosurgery;  Laterality: N/A;  . LAPAROSCOPIC CHOLECYSTECTOMY  2010  . LUMBAR FUSION Right 06/08/2017   LUMBAR THREE-FOUR, LUMBAR FOUR-FIVE POSTEROLATERAL ARTHRODESIS WITH RIGHT LUMBAR FOUR-FIVE LAMINECTOMY/FORAMINOTOMY  . LUMBAR LAMINECTOMY/DECOMPRESSION MICRODISCECTOMY Right 03/08/2016   Procedure: Laminectomy and Foraminotomy - Lumbar Five-Sacral One Right;  Surgeon: Kary Kos, MD;  Location: [place];  Service: Neurosurgery;  Laterality: Right;  Right  . MULTIPLE TOOTH EXTRACTIONS     "3"  . POSTERIOR LUMBAR FUSION  10/14/2016   L5-S1  . REPLACEMENT TOTAL KNEE Left 2003  . THORACIC AORTIC ANEURYSM REPAIR  2010    Family History  Problem Relation Age of Onset  . Heart failure Mother   . Hypertension Mother   . Asthma Mother   . Osteoporosis Mother   . Heart attack Father 77       MI  . Hypertension Sister   . Prostate cancer Neg Hx   . Bladder Cancer Neg Hx   . Kidney cancer Neg Hx   . Colon cancer Neg Hx     Social History:  reports that he quit smoking about 37 years ago. His smoking use included cigarettes. He has a 32.00 pack-year smoking history. He has never used smokeless tobacco. He reports current alcohol use. He reports that he does not use drugs. He smoked 1 pack a day x 12 years.  He stopped smoking 30 years ago.  He drinks a glass of wine occasionally.  He is a Software engineer.  [place] His wife passed away in 2018-08-11. The patient is alone today.  Allergies:  Allergies  Allergen Reactions  . Penicillins Hives, Swelling and Other (See Comments)    SWELLING REACTION UNSPECIFIED PATIENT HAS TAKEN AMOXICILLIN ON MED HX FROM [facility] PATIENT HAS HAD A PCN REACTION WITH IMMEDIATE RASH, FACIAL/TONGUE/THROAT SWELLING, SOB, OR LIGHTHEADEDNESS WITH HYPOTENSION:  #  #  #  YES  #  #  #   Has patient had a PCN reaction causing severe  rash involving mucus membranes or skin necrosis: No Has patient had a PCN reaction that required hospitalization No Has patient had a PCN reaction occurring within the last 10 years: No  . Demerol [Meperidine] Hives and Nausea And Vomiting    Current Medications: Current Outpatient Medications  Medication Sig Dispense Refill  . amLODipine (NORVASC) 10 MG tablet Take 1 tablet (10 mg total) by mouth daily. 180 tablet 3  . aspirin 325 MG EC tablet Take 325 mg by mouth every evening.     Marland Kitchen atorvastatin (LIPITOR) 40 MG tablet TAKE ONE TABLET BY MOUTH EVERY DAY (Patient taking differently: Take 40 mg by mouth at bedtime. ) 90 tablet 3  . Calcium Carbonate (CALCARB 600 PO) Take 600 mg by mouth daily.    . cetirizine (ZYRTEC) 10 MG tablet  Take 10 mg by mouth daily.    . cholecalciferol (VITAMIN D3) 25 MCG (1000 UT) tablet Take 1,000 Units by mouth daily.    . fenofibrate 160 MG tablet Take 1 tablet (160 mg total) by mouth at bedtime. 90 tablet 3  . FLUoxetine (PROZAC) 20 MG capsule TAKE 1 CAPSULE (20MG ) BY MOUTH EVERY DAY (Patient taking differently: Take 20 mg by mouth daily. ) 90 capsule 1  . Glycerin-Hypromellose-PEG 400 (ARTIFICIAL TEARS) 0.2-0.2-1 % SOLN Place 1 drop into both eyes daily as needed (dry eyes).    Marland Kitchen HYDROcodone-acetaminophen (NORCO) 5-325 MG tablet Take 1 tablet by mouth every 4 (four) hours as needed for moderate pain. 15 tablet 0  . hydrocortisone cream 1 % Apply 1 application topically daily as needed for itching.    . hydroxychloroquine (PLAQUENIL) 200 MG tablet Take 200 mg by mouth every other day.     . ibandronate (BONIVA) 150 MG tablet Take 1 tablet (150 mg total) by mouth every 30 (thirty) days. Take with a glass of water, on an empty stomach.  Stay upright for 30 min. 3 tablet 3  . lisinopril (PRINIVIL,ZESTRIL) 20 MG tablet Take 1 tablet (20 mg total) by mouth every evening. 90 tablet 3  . Omeprazole 20 MG TBEC Take 1 tablet (20 mg total) by mouth daily. 30 each 11  .  testosterone cypionate (DEPOTESTOSTERONE CYPIONATE) 200 MG/ML injection INJECT 21mL INTO THE MUSCLE EVERY 14 DAYS (Patient not taking: Reported on 12/20/2018) 10 mL 0  . traZODone (DESYREL) 50 MG tablet Take 1 or 2 tablet at bedtime prior to sleep as needed. 30 tablet 2  . vitamin B-12 (CYANOCOBALAMIN) 1000 MCG tablet Take 1,000 mcg by mouth daily.     No current facility-administered medications for this visit.     Review of Systems  Constitutional: Positive for malaise/fatigue and weight loss (4lbs). Negative for chills, diaphoresis and fever.       Feels "tired and drug out".   HENT: Negative.  Negative for congestion, hearing loss, sinus pain and sore throat.   Eyes: Negative.  Negative for blurred vision.  Respiratory: Negative.  Negative for cough, shortness of breath and wheezing.   Cardiovascular: Negative.  Negative for chest pain, palpitations, orthopnea, leg swelling and PND.  Gastrointestinal: Positive for constipation. Negative for abdominal pain, blood in stool, diarrhea, melena, nausea and vomiting.  Genitourinary: Negative.  Negative for dysuria, frequency, hematuria and urgency.  Musculoskeletal: Positive for back pain (4/10) and joint pain (rhuematoid arthritis). Negative for myalgias.  Skin: Negative.  Negative for rash.  Neurological: Negative.  Negative for dizziness, tingling, sensory change and weakness.  Endo/Heme/Allergies: Negative.  Does not bruise/bleed easily.       Testosterone q 2 weeks.  Psychiatric/Behavioral: Negative.  Negative for depression, memory loss and substance abuse. The patient is not nervous/anxious and does not have insomnia.   All other systems reviewed and are negative.  Performance status (ECOG): 2  Vitals There were no vitals taken for this visit.   Physical Exam  Constitutional: He is oriented to person, place, and time. He appears well-developed and well-nourished. No distress.  HENT:  Head: Normocephalic and atraumatic.   Mouth/Throat: Oropharynx is clear and moist. No oropharyngeal exudate.  Gray hair and [employer].    Eyes: Pupils are equal, round, and reactive to light. Conjunctivae and EOM are normal. No scleral icterus.  Hazel eyes.  Neck: Normal range of motion. Neck supple.  Cardiovascular: Normal rate, regular rhythm and normal heart sounds.  No murmur heard. Pulmonary/Chest: Effort normal and breath sounds normal. No respiratory distress. He has no wheezes.  Abdominal: Soft. Bowel sounds are normal. He exhibits no distension. There is no abdominal tenderness.  Musculoskeletal: Normal range of motion.        General: No edema.  Lymphadenopathy:    He has no cervical adenopathy.    He has no axillary adenopathy.       Right: No supraclavicular adenopathy present.       Left: No supraclavicular adenopathy present.  Neurological: He is alert and oriented to person, place, and time.  Skin: Skin is warm and dry. He is not diaphoretic. No erythema.  Psychiatric: He has a normal mood and affect. His behavior is normal. Judgment and thought content normal.  Nursing note and vitals reviewed.   Appointment on 12/19/2018  Component Date Value Ref Range Status  . Iron 12/19/2018 113  45 - 182 ug/dL Final  . TIBC 12/19/2018 462* 250 - 450 ug/dL Final  . Saturation Ratios 12/19/2018 24  17.9 - 39.5 % Final  . UIBC 12/19/2018 349  ug/dL Final   Performed at [facility], [address] [place], [place] [address]  . Sed Rate 12/19/2018 4  0 - 20 mm/hr Final   Performed at [facility], [address], [place], [place] [address]  . Ferritin 12/19/2018 90  24 - 336 ng/mL Final   Performed at [facility], [address] [place], [place] [address]  . WBC 12/19/2018 4.0  4.0 - 10.5 K/uL Final  . RBC 12/19/2018 3.90* 4.22 - 5.81 MIL/uL Final  . Hemoglobin 12/19/2018 12.4* 13.0 - 17.0 g/dL Final  . HCT 12/19/2018 37.4* 39.0 - 52.0 % Final  . MCV 12/19/2018 95.9  80.0 - 100.0 fL  Final  . MCH 12/19/2018 31.8  26.0 - 34.0 pg Final  . MCHC 12/19/2018 33.2  30.0 - 36.0 g/dL Final  . RDW 12/19/2018 13.7  11.5 - 15.5 % Final  . Platelets 12/19/2018 128* 150 - 400 K/uL Final   Comment: Immature Platelet Fraction may be clinically indicated, consider ordering this additional test [id number]   . nRBC 12/19/2018 0.0  0.0 - 0.2 % Final  . Neutrophils Relative % 12/19/2018 69  % Final  . Neutro Abs 12/19/2018 2.8  1.7 - 7.7 K/uL Final  . Lymphocytes Relative 12/19/2018 18  % Final  . Lymphs Abs 12/19/2018 0.7  0.7 - 4.0 K/uL Final  . Monocytes Relative 12/19/2018 9  % Final  . Monocytes Absolute 12/19/2018 0.4  0.1 - 1.0 K/uL Final  . Eosinophils Relative 12/19/2018 2  % Final  . Eosinophils Absolute 12/19/2018 0.1  0.0 - 0.5 K/uL Final  . Basophils Relative 12/19/2018 1  % Final  . Basophils Absolute 12/19/2018 0.0  0.0 - 0.1 K/uL Final  . Immature Granulocytes 12/19/2018 1  % Final  . Abs Immature Granulocytes 12/19/2018 0.02  0.00 - 0.07 K/uL Final   Performed at [facility], [address], [place], [place] [address]    Assessment:  Bryan [name] is a 78 y.o. male with rheumatoid arthritis and progressive anemia over the past 2 years.  He has been on Plaquenil and hydralazine which can cause anemia.  Plaquenil was discontinued on 08/26/2016.  He denies any exposure to radiation or toxins.  He denies any prior history of hepatitis, prior transfusions or HIV risk factors.  He denies any herbal products.  Diet is good.  EGD on 09/24/2016 revealed patchy candidiasis in the entire esophagus.  There were two non-bleeding angioectasias in the duodenum treated with argon plasma coagulation.  Gastritis was biopsied.  Pathology revealed mild chronic gastritis negative for H pylori, dysplasia and malignancy.  Colonoscopy on 09/24/2016 revealed diverticulosis in the entire colon, one 3 mm polyp in the cecum, and non-bleeding internal hemorrhoids.  Pathology from  the cecal polyp revealed a tubular adenoma negative for high grade dysplasia and malignancy.    He denies any melena or hematochezia. He denies any hematuria.  CBC on 06/02/2016 revealed a hematocrit of 29.5, hemoglobin 10.0, MCV 92.3, platelets 155,000, and WBC 4200.  Stool was guaiac negative x 2 in 06/2016.  Labs on 06/04/2016 revealed a ferritin of 124, iron saturation 22% and TIBC of 370.  B12 was 883 on 06/18/2014.  Work-up on 07/19/2016 revealed a hematocrit of 31.7, hemoglobin 10.6, MCV 91, platelets 143,000, white count 3000 with an ANC of 1800.  Absolute lymphocyte count was 700 (low). Creatinine was 1.57.  LDH was 269 (98-192).  Normal labs included:  uric acid, folate, [place], SPEP, and copper.  Iron studies included a saturation of 11% (low) and a TIBC of 454 (high) c/w iron deficiency anemia.  Sed rate was 19.  Retic was 0.9% (low).  He is on oral iron with vitamin C.  Chest, abdomen, and pelvic CTon 08/09/2016 revealed no acute findings or clear evidence of malignancy in the chest, abdomen or pelvis. There was asymmetric left rectal wall thickeningpossibly secondary to volume averaging with the prostate gland.  Bone marrow aspirate and biopsy on 11/04/2016 revealed a normocellular marrow (20%) with trilineage hematopoiesis and maturation.  There was mild megakaryocytic atypia.  Storage iron was present.  There were rare ringed sideroblasts.  Cytogenetics and FISH were normal (46, XY).  He was admitted to [facility] on 10/14/2016 - 10/18/2016 for spinal stenosis of the lumbar region.  He underwent redo decompressive lumbar laminotomy L5-S1 with radical foraminotomy the L5 and S1 nerve root with complete medial facetectomies. He underwentback surgery(revision fusion removal hardware and pedicle screw fixation) on 10/20/2017.  He was admitted to [facility] from 10/28/2017 - 10/31/2017 with symptomatic anemia.  He received 2 units of PRBCs.  Hemoglobin improved from 6.2 to 8.1.   Hospital work-up revealed the following normal labs: ferritin (65), B12 (401), TSH, SPEP, haptoglobin. Testosterone was < 3 (low).  Iron saturation was 7% with a TIBC of 317. LDH was 205 (98-192). Retic was 2.6%. Creatinine was 1.37 (CrCl 47.3 ml/min).  Peripheral smear revealedleukopenia with normal WBC morphology. Normocytic anemia with polychromasia, anisocytosis and rouleaux formation.   Work-up on 11/08/2017 revealed a hematocrit of 27.2, hemoglobin 9.0, platelets 200,000, WBC 5800 with an [place] of 4100.  Ferritin was 86.  Iron saturation was 6% with a TIBC of 382.  Sed rate was 56 (0-20).  Cold agglutinins were negative.  Reticulocyte count was 1.2%.  SPEP revealed no monoclonal protein on 10/31/2017.  He received weekly Venofer x 2 (11/25/2017 - 12/02/2017) and x 2 (12/23/2017 - 12/30/2017).  Ferritin has been followed: 106 on 06/24/2017, 65 on 10/30/2017, 86 on 11/08/2017, 124 on 12/12/2017, 106 on 12/15/2017, 95 on 01/20/2018, 133 on 03/10/2018, and 90 on 12/19/2018.  Iron saturation was 6% on 06/24/2017 and 6% on 01/20/2018.  Sed rate was 56 on 11/08/2017 and 64 on 12/05/2017.  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  He has treated with Septra then switched to doxycycline.  He underwent back  surgery on 10/20/2017.  Symptomatically, ***  Plan: 1.   Review labs from 12/19/2018.   2.  Iron deficiency anemia             Hemoglobin 9.9.  Ferritin is 95.  Sed rate 34.             s/p Venofer x 2 trial secondary to elevated sed rate.             No additional Venofer. 3.  Low zinc level (mild)               Significance is unclear.  Usual oral intake of zinc is approximately 4 to 14 mg/day.               Dietary sources of zinc:  meat, seafood, and milk. Cereals contains the greatest amount of zinc.             Low levels can be found in Crohn's disease, liver disease, and renal disease.               Suggest MVI with zinc. 4.  Thrombocytopenia             Discuss  new thrombocytopenia.             No new medications or herbal products.             Check counts in 6 weeks.             If etiology remains unclear, may need to repeat bone marrow. 5.  RTC in 6 weeks for labs (CBC with diff, ferritin, iron studies, zinc). 6.  RTC in 3 months for MD assessment, labs (CBC with diff, ferritin, iron studies, sed rate- day before), and +/- Venofer.  I discussed the assessment and treatment plan with the patient.  The patient was provided an opportunity to ask questions and all were answered.  The patient agreed with the plan and demonstrated an understanding of the instructions.  The patient was advised to call back if the symptoms worsen or if the condition fails to improve as anticipated.  I provided *** minutes of face-to-face time during this this encounter and > 50% was spent counseling as documented under my assessment and plan.    Lequita Asal, MD, PhD    12/20/2018, 6:04 PM  I, ***, am acting as scribe for [place]. [name], MD, PhD.  {Add scribe attestation statement}

## 2018-12-21 ENCOUNTER — Other Ambulatory Visit: Payer: Self-pay

## 2018-12-21 ENCOUNTER — Encounter: Payer: Self-pay | Admitting: Hematology and Oncology

## 2018-12-21 ENCOUNTER — Inpatient Hospital Stay (HOSPITAL_BASED_OUTPATIENT_CLINIC_OR_DEPARTMENT_OTHER): Payer: Medicare Other | Admitting: Hematology and Oncology

## 2018-12-21 VITALS — BP 150/72 | HR 64 | Temp 96.4°F | Resp 18 | Ht 72.0 in | Wt 165.8 lb

## 2018-12-21 DIAGNOSIS — D509 Iron deficiency anemia, unspecified: Secondary | ICD-10-CM | POA: Diagnosis not present

## 2018-12-21 DIAGNOSIS — M069 Rheumatoid arthritis, unspecified: Secondary | ICD-10-CM

## 2018-12-21 DIAGNOSIS — R5383 Other fatigue: Secondary | ICD-10-CM

## 2018-12-21 DIAGNOSIS — Z79899 Other long term (current) drug therapy: Secondary | ICD-10-CM

## 2018-12-21 DIAGNOSIS — D696 Thrombocytopenia, unspecified: Secondary | ICD-10-CM

## 2018-12-21 LAB — TSH: TSH: 0.892 u[IU]/mL (ref 0.350–4.500)

## 2018-12-21 NOTE — Progress Notes (Signed)
[facility]  [address] [place], [place] [address] Phone: [phone]  Fax: [phone]   Clinic Day:  12/21/2018  Referring physician: [name], MD  Chief Complaint: Bryan [name] is a 78 y.o. male with rheumatoid arthritis, iron deficiency anemia, and mild pancytopenia who is seen for 10 month assessment.  HPI: The patient was last seen in the hematology clinic on 01/27/2018. At that time, he remained tired. Exam was stable.  Platelet count 117,000.  He was seen in Urology on 04/12/2018 and 08/02/2017 by Zara Council, PA for testosterone deficiency.  He was seen in surgery by Dr. Bary Castilla on 04/18/2018 for a right inguinal hernia. He underwent a hernia repair on 05/02/2018.   Labs on 12/19/2018 included a hematocrit of 37.4, hemoglobin 12.4, MCV 95.9, platelets 128,000, white count 4000 with an ANC 2800.  Ferritin was 90 with this iron saturation of 24% and a TIBC of 462.  Sed rate was 4.  During the interim, the patient is doing "alright". He notes some depression from the passing of his wife in 06/2018. He notes feeling fatigued, and is having some joint pain particularly in his feet. He reports taking Hydroxychloroquine x 3 weekly. His bowels have improved; he denies any constipation.   He notes back pain which he attributes to compression fracture in 11/2018.   Past Medical History:  Diagnosis Date   Abnormal CT scan    Asymmetric left rectal wall thickening    Anemia    Anxiety    Arrhythmia    Chronic lower back pain    Complication of anesthesia    Memory loss 09/2015   Coronary artery disease    Depression    GERD (gastroesophageal reflux disease)    Glaucoma    H/O thoracic aortic aneurysm repair    Heart murmur    History of being hospitalized    memory lose kidney funtion down blood pressure up   History of blood transfusion    "think he had one when he had heart valve OR" (10/14/2016);  "none since" (10/19/2017)   History of chicken pox    Hyperlipidemia    Hypertension    Lumbar stenosis    Murmur    Neuropathy    Osteoarthritis    worse in feet and ankles   Paroxysmal A-fib ([place])    Poor short term memory    takes Aricept   Rheumatoid arthritis ([place])    "all over" (10/14/2016)   Schizophrenia ([place])    Seasonal allergies    Valvular heart disease     Past Surgical History:  Procedure Laterality Date   AORTIC VALVE REPLACEMENT  2007   [facility].  [place], [place]; "pig valve"   APPENDECTOMY  2010   BACK SURGERY     CARDIAC VALVE REPLACEMENT     CATARACT EXTRACTION W/ INTRAOCULAR LENS  IMPLANT, BILATERAL Bilateral 2012   COLONOSCOPY WITH PROPOFOL N/A 09/24/2016   Procedure: COLONOSCOPY WITH PROPOFOL;  Surgeon: Jonathon Bellows, MD;  Location: [facility] ENDOSCOPY;  Service: Endoscopy;  Laterality: N/A;   ESOPHAGOGASTRODUODENOSCOPY (EGD) WITH PROPOFOL N/A 09/24/2016   Procedure: ESOPHAGOGASTRODUODENOSCOPY (EGD) WITH PROPOFOL;  Surgeon: Jonathon Bellows, MD;  [facility] END[facility]COPY;  Service: Endoscopy;  Laterality: N/A;   GIVENS CAPSULE STUDY N/A 09/24/2016   Procedure: GIVENS CAPSULE STUDY;  Surgeon: Jonathon Bellows, MD;  Location: [facility] ENDOSCOPY;  Service: Endoscopy;  Laterality: N/A;   HAMMER TOE SURGERY Right 10/09/2015   Procedure: HAMMER TOE REPAIR WITH K-WIRE  FIXATION RIGHT SECOND TOE;  Surgeon: Albertine Patricia, DPM;  Location: [place];  Service: Podiatry;  Laterality: Right;  WITH LOCAL   INGUINAL HERNIA REPAIR Right 05/05/2018   Medium Bard PerFix plug.  Surgeon: Robert Bellow, MD;  Location: [facility] ORS;  Service: General;  Laterality: Right;   IR VERTEBROPLASTY CERV/THOR BX INC UNI/BIL INC/INJECT/IMAGING  10/27/2018   JOINT REPLACEMENT Left    left total knee   LAMINECTOMY WITH POSTERIOR LATERAL ARTHRODESIS LEVEL 3 N/A 09/28/2017   Procedure: LUMBAR  POSTEROR  FUSION REVISION - LUMBAR ONE -TWO, LUMBAR TWO -THREE,THREE-FOUR ,BILATERAL  ARTHRODESIS REMOVAL LUMBAR THREE HARDWARE;  Surgeon: Kary Kos, MD;  Location: [place];  Service: Neurosurgery;  Laterality: N/A;   LAMINECTOMY WITH POSTERIOR LATERAL ARTHRODESIS LEVEL 3 N/A 10/20/2017   Procedure: Revision fusion and removal of hardware Lumbar one, Pedicle screw fixation thoracic ten-lumbar two, thoracic nine-ten laminotomy, Posterior Lumbar Arthrodesis thoracic ten-lumbar two ;  Surgeon: Kary Kos, MD;  Location: [place];  Service: Neurosurgery;  Laterality: N/A;   LAPAROSCOPIC CHOLECYSTECTOMY  2010   LUMBAR FUSION Right 06/08/2017   LUMBAR THREE-FOUR, LUMBAR FOUR-FIVE POSTEROLATERAL ARTHRODESIS WITH RIGHT LUMBAR FOUR-FIVE LAMINECTOMY/FORAMINOTOMY   LUMBAR LAMINECTOMY/DECOMPRESSION MICRODISCECTOMY Right 03/08/2016   Procedure: Laminectomy and Foraminotomy - Lumbar Five-Sacral One Right;  Surgeon: Kary Kos, MD;  Location: [place];  Service: Neurosurgery;  Laterality: Right;  Right   MULTIPLE TOOTH EXTRACTIONS     "3"   POSTERIOR LUMBAR FUSION  10/14/2016   L5-S1   REPLACEMENT TOTAL KNEE Left 2003   THORACIC AORTIC ANEURYSM REPAIR  2010    Family History  Problem Relation Age of Onset   Heart failure Mother    Hypertension Mother    Asthma Mother    Osteoporosis Mother    Heart attack Father 67       MI   Hypertension Sister    Prostate cancer Neg Hx    Bladder Cancer Neg Hx    Kidney cancer Neg Hx    Colon cancer Neg Hx     Social History:  reports that he quit smoking about 37 years ago. His smoking use included cigarettes. He has a 32.00 pack-year smoking history. He has never used smokeless tobacco. He reports current alcohol use. He reports that he does not use drugs. He smoked 1 pack a day x 12 years.  He stopped smoking 30 years ago.  He drinks a glass of wine occasionally.  He is a Software engineer.  [place] His wife passed away in 08/02/2018. The patient is alone today.  Allergies:  Allergies    Allergen Reactions   Penicillins Hives, Swelling and Other (See Comments)    SWELLING REACTION UNSPECIFIED PATIENT HAS TAKEN AMOXICILLIN ON MED HX FROM [facility] PATIENT HAS HAD A PCN REACTION WITH IMMEDIATE RASH, FACIAL/TONGUE/THROAT SWELLING, SOB, OR LIGHTHEADEDNESS WITH HYPOTENSION:  #  #  #  YES  #  #  #   Has patient had a PCN reaction causing severe rash involving mucus membranes or skin necrosis: No Has patient had a PCN reaction that required hospitalization No Has patient had a PCN reaction occurring within the last 10 years: No   Demerol [Meperidine] Hives and Nausea And Vomiting    Current Medications: Current Outpatient Medications  Medication Sig Dispense Refill   amLODipine (NORVASC) 10 MG tablet Take 1 tablet (10 mg total) by mouth daily. 180 tablet 3   aspirin 325 MG EC tablet  Take 325 mg by mouth every evening.      atorvastatin (LIPITOR) 40 MG tablet TAKE ONE TABLET BY MOUTH EVERY DAY (Patient taking differently: Take 40 mg by mouth at bedtime. ) 90 tablet 3   Calcium Carbonate (CALCARB 600 PO) Take 600 mg by mouth daily.     cetirizine (ZYRTEC) 10 MG tablet Take 10 mg by mouth daily.     cholecalciferol (VITAMIN D3) 25 MCG (1000 UT) tablet Take 1,000 Units by mouth daily.     fenofibrate 160 MG tablet Take 1 tablet (160 mg total) by mouth at bedtime. 90 tablet 3   FLUoxetine (PROZAC) 20 MG capsule TAKE 1 CAPSULE (20MG ) BY MOUTH EVERY DAY (Patient taking differently: Take 20 mg by mouth daily. ) 90 capsule 1   Glycerin-Hypromellose-PEG 400 (ARTIFICIAL TEARS) 0.2-0.2-1 % SOLN Place 1 drop into both eyes daily as needed (dry eyes).     HYDROcodone-acetaminophen (NORCO) 5-325 MG tablet Take 1 tablet by mouth every 4 (four) hours as needed for moderate pain. 15 tablet 0   hydroxychloroquine (PLAQUENIL) 200 MG tablet Take 200 mg by mouth every other day.      ibandronate (BONIVA) 150 MG tablet Take 1 tablet (150 mg total) by mouth every 30 (thirty) days. Take with a  glass of water, on an empty stomach.  Stay upright for 30 min. 3 tablet 3   lisinopril (PRINIVIL,ZESTRIL) 20 MG tablet Take 1 tablet (20 mg total) by mouth every evening. 90 tablet 3   Omeprazole 20 MG TBEC Take 1 tablet (20 mg total) by mouth daily. 30 each 11   traZODone (DESYREL) 50 MG tablet Take 1 or 2 tablet at bedtime prior to sleep as needed. 30 tablet 2   vitamin B-12 (CYANOCOBALAMIN) 1000 MCG tablet Take 1,000 mcg by mouth daily.     hydrocortisone cream 1 % Apply 1 application topically daily as needed for itching.     testosterone cypionate (DEPOTESTOSTERONE CYPIONATE) 200 MG/ML injection INJECT 69mL INTO THE MUSCLE EVERY 14 DAYS (Patient not taking: Reported on 12/20/2018) 10 mL 0   No current facility-administered medications for this visit.     Review of Systems  Constitutional: Positive for malaise/fatigue. Negative for chills, diaphoresis, fever and weight loss (stable).       Doing "alright".   HENT: Negative.  Negative for congestion, ear pain, hearing loss, nosebleeds, sinus pain and sore throat.   Eyes: Negative.  Negative for blurred vision, double vision and photophobia.  Respiratory: Negative.  Negative for cough, shortness of breath and wheezing.   Cardiovascular: Negative.  Negative for chest pain, palpitations, orthopnea, leg swelling and PND.  Gastrointestinal: Negative.  Negative for abdominal pain, blood in stool, constipation, diarrhea, melena, nausea and vomiting.  Genitourinary: Negative.  Negative for dysuria, frequency, hematuria and urgency.  Musculoskeletal: Positive for back pain and joint pain (rhuematoid arthritis). Negative for myalgias.  Skin: Negative.  Negative for rash.  Neurological: Negative.  Negative for dizziness, tingling, sensory change and weakness.  Endo/Heme/Allergies: Negative.  Does not bruise/bleed easily.       Testosterone q 2 weeks.  Psychiatric/Behavioral: Positive for depression (lost his wife in 06/2018). Negative for memory  loss and substance abuse. The patient is not nervous/anxious and does not have insomnia.   All other systems reviewed and are negative.  Performance status (ECOG): 2  Vitals Blood pressure (!) 150/72, pulse 64, temperature (!) 96.4 F (35.8 C), temperature source Tympanic, resp. rate 18, height 6' (1.829 m), weight 165 lb 12.6  oz (75.2 kg), SpO2 99 %.   Physical Exam  Constitutional: He is oriented to person, place, and time. He appears well-developed and well-nourished. No distress.  HENT:  Head: Normocephalic and atraumatic.  Mouth/Throat: Oropharynx is clear and moist. No oropharyngeal exudate.  White hair and goatee.    Eyes: Pupils are equal, round, and reactive to light. Conjunctivae and EOM are normal. No scleral icterus.  Hazel eyes.  Neck: Normal range of motion. Neck supple.  Cardiovascular: Normal rate, regular rhythm and normal heart sounds.  No murmur heard. Pulmonary/Chest: Effort normal and breath sounds normal. No respiratory distress. He has no wheezes. He has no rales.  Abdominal: Soft. Bowel sounds are normal. He exhibits no distension and no mass. There is no abdominal tenderness. There is no rebound and no guarding.  Musculoskeletal: Normal range of motion.        General: No edema.  Lymphadenopathy:    He has no cervical adenopathy.    He has no axillary adenopathy.       Right: No supraclavicular adenopathy present.       Left: No supraclavicular adenopathy present.  Neurological: He is alert and oriented to person, place, and time.  Skin: Skin is warm and dry. No rash noted. He is not diaphoretic. No erythema. No pallor.  Psychiatric: He has a normal mood and affect. His behavior is normal. Judgment and thought content normal.  Nursing note and vitals reviewed.   Appointment on 12/19/2018  Component Date Value Ref Range Status   Iron 12/19/2018 113  45 - 182 ug/dL Final   TIBC 12/19/2018 462* 250 - 450 ug/dL Final   Saturation Ratios 12/19/2018 24   17.9 - 39.5 % Final   UIBC 12/19/2018 349  ug/dL Final   Performed at [facility], [address] [place], [place] [address]   Sed Rate 12/19/2018 4  0 - 20 mm/hr Final   Performed at [facility], [address], [place], [place] [address]   Ferritin 12/19/2018 90  24 - 336 ng/mL Final   Performed at [facility], [place], [place] [address]   WBC 12/19/2018 4.0  4.0 - 10.5 K/uL Final   RBC 12/19/2018 3.90* 4.22 - 5.81 MIL/uL Final   Hemoglobin 12/19/2018 12.4* 13.0 - 17.0 g/dL Final   HCT 12/19/2018 37.4* 39.0 - 52.0 % Final   MCV 12/19/2018 95.9  80.0 - 100.0 fL Final   MCH 12/19/2018 31.8  26.0 - 34.0 pg Final   MCHC 12/19/2018 33.2  30.0 - 36.0 g/dL Final   RDW 12/19/2018 13.7  11.5 - 15.5 % Final   Platelets 12/19/2018 128* 150 - 400 K/uL Final   Comment: Immature Platelet Fraction may be clinically indicated, consider ordering this additional test [id number]    nRBC 12/19/2018 0.0  0.0 - 0.2 % Final   Neutrophils Relative % 12/19/2018 69  % Final   Neutro Abs 12/19/2018 2.8  1.7 - 7.7 K/uL Final   Lymphocytes Relative 12/19/2018 18  % Final   Lymphs Abs 12/19/2018 0.7  0.7 - 4.0 K/uL Final   Monocytes Relative 12/19/2018 9  % Final   Monocytes Absolute 12/19/2018 0.4  0.1 - 1.0 K/uL Final   Eosinophils Relative 12/19/2018 2  % Final   Eosinophils Absolute 12/19/2018 0.1  0.0 - 0.5 K/uL Final   Basophils Relative 12/19/2018 1  % Final   Basophils Absolute 12/19/2018 0.0  0.0 - 0.1 K/uL Final   Immature  Granulocytes 12/19/2018 1  % Final   Abs Immature Granulocytes 12/19/2018 0.02  0.00 - 0.07 K/uL Final   Performed at [facility], [address], [place], [place] [address]    Assessment:  Bryan [name] is a 78 y.o. male with rheumatoid arthritis and progressive anemia over the past 2 years.  He has been on Plaquenil and hydralazine which can cause anemia.  Plaquenil was  discontinued on 08/26/2016.  He denies any exposure to radiation or toxins.  He denies any prior history of hepatitis, prior transfusions or HIV risk factors.  He denies any herbal products.  Diet is good.  EGD on 09/24/2016 revealed patchy candidiasis in the entire esophagus.  There were two non-bleeding angioectasias in the duodenum treated with argon plasma coagulation.  Gastritis was biopsied.  Pathology revealed mild chronic gastritis negative for H pylori, dysplasia and malignancy.  Colonoscopy on 09/24/2016 revealed diverticulosis in the entire colon, one 3 mm polyp in the cecum, and non-bleeding internal hemorrhoids.  Pathology from the cecal polyp revealed a tubular adenoma negative for high grade dysplasia and malignancy.    He denies any melena or hematochezia. He denies any hematuria.  CBC on 06/02/2016 revealed a hematocrit of 29.5, hemoglobin 10.0, MCV 92.3, platelets 155,000, and WBC 4200.  Stool was guaiac negative x 2 in 06/2016.  Labs on 06/04/2016 revealed a ferritin of 124, iron saturation 22% and TIBC of 370.  B12 was 883 on 06/18/2014.  Work-up on 07/19/2016 revealed a hematocrit of 31.7, hemoglobin 10.6, MCV 91, platelets 143,000, white count 3000 with an ANC of 1800.  Absolute lymphocyte count was 700 (low). Creatinine was 1.57.  LDH was 269 (98-192).  Normal labs included:  uric acid, folate, [place], SPEP, and copper.  Iron studies included a saturation of 11% (low) and a TIBC of 454 (high) c/w iron deficiency anemia.  Sed rate was 19.  Retic was 0.9% (low).  He is on oral iron with vitamin C.  Chest, abdomen, and pelvic CTon 08/09/2016 revealed no acute findings or clear evidence of malignancy in the chest, abdomen or pelvis. There was asymmetric left rectal wall thickeningpossibly secondary to volume averaging with the prostate gland.  Bone marrow aspirate and biopsy on 11/04/2016 revealed a normocellular marrow (20%) with trilineage hematopoiesis and maturation.   There was mild megakaryocytic atypia.  Storage iron was present.  There were rare ringed sideroblasts.  Cytogenetics and FISH were normal (46, XY).  He was [facility] to [facility] on 10/14/2016 - 10/18/2016 for spinal stenosis of the lumbar region.  He underwent redo decompressive lumbar laminotomy L5-S1 with radical foraminotomy the L5 and S1 nerve root with complete medial facetectomies. He underwentback surgery(revision fusion removal hardware and pedicle screw fixation) on 10/20/2017.  He was admitted to [facility] from 10/28/2017 - 10/31/2017 with symptomatic anemia.  He received 2 units of PRBCs.  Hemoglobin improved from 6.2 to 8.1.  Hospital work-up revealed the following normal labs: ferritin (65), B12 (401), TSH, SPEP, haptoglobin. Testosterone was < 3 (low).  Iron saturation was 7% with a TIBC of 317. LDH was 205 (98-192). Retic was 2.6%. Creatinine was 1.37 (CrCl 47.3 ml/min).  Peripheral smear revealedleukopenia with normal WBC morphology. Normocytic anemia with polychromasia, anisocytosis and rouleaux formation.   Work-up on 11/08/2017 revealed a hematocrit of 27.2, hemoglobin 9.0, platelets 200,000, WBC 5800 with an [place] of 4100.  Ferritin was 86.  Iron saturation was 6% with a TIBC of 382.  Sed rate was 56 (0-20).  Cold agglutinins were negative.  Reticulocyte count was 1.2%.  SPEP revealed no monoclonal protein on 10/31/2017.  He received weekly Venofer x 2 (11/25/2017 - 12/02/2017) and x 2 (12/23/2017 - 12/30/2017).  Ferritin has been followed: 106 on 06/24/2017, 65 on 10/30/2017, 86 on 11/08/2017, 124 on 12/12/2017, 106 on 12/15/2017, 95 on 01/20/2018, 133 on 03/10/2018, and 90 on 12/19/2018.  Iron saturation was 6% on 06/24/2017 and 6% on 01/20/2018.  Sed rate was 56 on 11/08/2017 and 64 on 12/05/2017.  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  He has treated with Septra then switched to doxycycline.  He underwent back surgery on 10/20/2017.  Symptomatically, he  is fatigued.  He is on hydroxychloroquine 3x/week.  Exam is stable.  Plan: 1.   Review labs from 12/19/2018. 2.   Iron deficiency anemia             Hemoglobin 12.4.  MCV 95.9.  Ferritin 90. Iron saturation 24% with a TIBC of 462.             No Alroy Bailiff for today. 3.  Thrombocytopenia             Platelet count 128,000.             Patient on no new medications or herbal products.    Patient on Plaquenil.   Continue to monitor. 4.   Fatigue  Add TSH to labs. 5.   RTC in 3 months for labs (CBC with diff, ferritin, iron studies, zinc). 6.   RTC in 6 months for MD assessment, labs (CBC with diff, ferritin, iron studies, sed rate- day before), and +/- Venofer.  I discussed the assessment and treatment plan with the patient.  The patient was provided an opportunity to ask questions and all were answered.  The patient agreed with the plan and demonstrated an understanding of the instructions.  The patient was advised to call back if the symptoms worsen or if the condition fails to improve as anticipated.   Lequita Asal, MD, PhD    12/21/2018, 9:39 AM  I, Selena Batten, am acting as scribe for [employer]. [name], MD, PhD.  I, Jean Alejos C. [name], MD, have reviewed the above documentation for accuracy and completeness, and I agree with the above.

## 2018-12-21 NOTE — Progress Notes (Signed)
No new changes noted today 

## 2018-12-22 ENCOUNTER — Other Ambulatory Visit: Payer: Self-pay | Admitting: Family Medicine

## 2018-12-22 ENCOUNTER — Other Ambulatory Visit: Payer: Self-pay

## 2018-12-22 DIAGNOSIS — E291 Testicular hypofunction: Secondary | ICD-10-CM

## 2018-12-25 ENCOUNTER — Other Ambulatory Visit: Payer: Self-pay

## 2018-12-25 ENCOUNTER — Other Ambulatory Visit: Payer: Medicare Other

## 2018-12-25 DIAGNOSIS — M546 Pain in thoracic spine: Secondary | ICD-10-CM | POA: Diagnosis not present

## 2018-12-25 DIAGNOSIS — E291 Testicular hypofunction: Secondary | ICD-10-CM | POA: Diagnosis not present

## 2018-12-26 ENCOUNTER — Telehealth: Payer: Self-pay

## 2018-12-26 LAB — TESTOSTERONE: Testosterone: >1500 ng/dL — ABNORMAL HIGH (ref 264–916)

## 2018-12-26 LAB — HEMATOCRIT: Hematocrit: 36.9 % — ABNORMAL LOW (ref 37.5–51.0)

## 2018-12-26 LAB — HEMOGLOBIN: Hemoglobin: 12 g/dL — ABNORMAL LOW (ref 13.0–17.7)

## 2018-12-26 NOTE — Telephone Encounter (Signed)
Advised patient of results and need to stop injections. He states he does not have his calendar right now and will call back to make an appt with Larene Beach to discuss different therapy.

## 2018-12-26 NOTE — Telephone Encounter (Signed)
-----   Message from Nori Riis, PA-C sent at 12/26/2018  8:16 AM EDT ----- Mr. Breeze will need to discontinue his testosterone injections at this time.  He will need to have an appointment to discuss other testosterone therapies as the injections are causing very high testosterone levels.

## 2018-12-27 DIAGNOSIS — M546 Pain in thoracic spine: Secondary | ICD-10-CM | POA: Diagnosis not present

## 2019-01-02 DIAGNOSIS — M546 Pain in thoracic spine: Secondary | ICD-10-CM | POA: Diagnosis not present

## 2019-01-05 DIAGNOSIS — M546 Pain in thoracic spine: Secondary | ICD-10-CM | POA: Diagnosis not present

## 2019-01-09 DIAGNOSIS — M546 Pain in thoracic spine: Secondary | ICD-10-CM | POA: Diagnosis not present

## 2019-01-11 DIAGNOSIS — M5440 Lumbago with sciatica, unspecified side: Secondary | ICD-10-CM | POA: Diagnosis not present

## 2019-01-11 DIAGNOSIS — M4807 Spinal stenosis, lumbosacral region: Secondary | ICD-10-CM | POA: Diagnosis not present

## 2019-01-11 DIAGNOSIS — S32009K Unspecified fracture of unspecified lumbar vertebra, subsequent encounter for fracture with nonunion: Secondary | ICD-10-CM | POA: Diagnosis not present

## 2019-01-12 DIAGNOSIS — M546 Pain in thoracic spine: Secondary | ICD-10-CM | POA: Diagnosis not present

## 2019-01-15 NOTE — Progress Notes (Signed)
08/03/2018 1:46 PM   Bryan [name] November 27, 1940 SX:[id number]  Referring provider: [name], MD [address] [place],  [place] [address]  Chief Complaint  Patient presents with  . Hypogonadism    HPI: Bryan [name] is a 78 y.o. male with testosterone deficiency, erectile dysfunction and BPH with LUTS who presents today for follow up.  He recently became a widower as his wife died 07/02/18.  Testosterone deficiency Patient was advised on 04/12/2018 to continue his testosterone therapy as he is anemic and has osteoporosis.  He is currently having 0.5 cc of 200mg /cc of testosterone cypionate injected by [facility] every 14 days.  His levels were found to be > 1500 in 12/2018 and he has since discontinued the therapy.    He would like to continue the testosterone cypionate injections if he can as he has [facility] inject the medication.    He is still experiencing frequency, urgency, nocturia and incontinence.  He states it is somewhat better.  Patient denies any gross hematuria, dysuria or suprapubic/flank pain.  Patient denies any fevers, chills, nausea or vomiting.   PMH: Past Medical History:  Diagnosis Date  . Abnormal CT scan    Asymmetric left rectal wall thickening   . Anemia   . Anxiety   . Arrhythmia   . Chronic lower back pain   . Complication of anesthesia    Memory loss 09/2015  . Coronary artery disease   . Depression   . GERD (gastroesophageal reflux disease)   . Glaucoma   . H/O thoracic aortic aneurysm repair   . Heart murmur   . History of being hospitalized    memory lose kidney funtion down blood pressure up  . History of blood transfusion    "think he had one when he had heart valve OR" (10/14/2016); "none since" (10/19/2017)  . History of chicken pox   . Hyperlipidemia   . Hypertension   . Lumbar stenosis   . Murmur   . Neuropathy   . Osteoarthritis    worse in feet and ankles  . Paroxysmal A-fib  ([place])   . Poor short term memory    takes Aricept  . Rheumatoid arthritis ([place])    "all over" (10/14/2016)  . Schizophrenia ([place])   . Seasonal allergies   . Valvular heart disease     Surgical History: Past Surgical History:  Procedure Laterality Date  . AORTIC VALVE REPLACEMENT  2007   [facility].  Supply, [place]; "pig valve"  . APPENDECTOMY  2010  . BACK SURGERY    . CARDIAC VALVE REPLACEMENT    . CATARACT EXTRACTION W/ INTRAOCULAR LENS  IMPLANT, BILATERAL Bilateral 2012  . COLONOSCOPY WITH PROPOFOL N/A 09/24/2016   Procedure: COLONOSCOPY WITH PROPOFOL;  Surgeon: Jonathon Bellows, MD;  Location: [facility] ENDOSCOPY;  Service: Endoscopy;  Laterality: N/A;  . ESOPHAGOGASTRODUODENOSCOPY (EGD) WITH PROPOFOL N/A 09/24/2016   Procedure: ESOPHAGOGASTRODUODENOSCOPY (EGD) WITH PROPOFOL;  Surgeon: Jonathon Bellows, MD;  Location: [facility] ENDOSCOPY;  Service: Endoscopy;  Laterality: N/A;  . GIVENS CAPSULE STUDY N/A 09/24/2016   Procedure: GIVENS CAPSULE STUDY;  Surgeon: Jonathon Bellows, MD;  Location: [facility] ENDOSCOPY;  Service: Endoscopy;  Laterality: N/A;  . HAMMER TOE SURGERY Right 10/09/2015   Procedure: HAMMER TOE REPAIR WITH K-WIRE FIXATION RIGHT SECOND TOE;  Surgeon: Albertine Patricia, DPM;  Location: [place];  Service: Podiatry;  Laterality: Right;  WITH LOCAL  . INGUINAL HERNIA REPAIR Right 05/05/2018   Medium Bard  PerFix plug.  Surgeon: Robert Bellow, MD;  Location: [facility] ORS;  Service: General;  Laterality: Right;  . IR VERTEBROPLASTY CERV/THOR BX INC UNI/BIL INC/INJECT/IMAGING  10/27/2018  . JOINT REPLACEMENT Left    left total knee  . LAMINECTOMY WITH POSTERIOR LATERAL ARTHRODESIS LEVEL 3 N/A 09/28/2017   Procedure: LUMBAR  POSTEROR  FUSION REVISION - LUMBAR ONE -TWO, LUMBAR TWO -THREE,THREE-FOUR ,BILATERAL ARTHRODESIS REMOVAL LUMBAR THREE HARDWARE;  Surgeon: Kary Kos, MD;  Location: [place];  Service: Neurosurgery;  Laterality: N/A;  . LAMINECTOMY WITH POSTERIOR LATERAL ARTHRODESIS LEVEL 3 N/A  10/20/2017   Procedure: Revision fusion and removal of hardware Lumbar one, Pedicle screw fixation thoracic ten-lumbar two, thoracic nine-ten laminotomy, Posterior Lumbar Arthrodesis thoracic ten-lumbar two ;  Surgeon: Kary Kos, MD;  Location: [place];  Service: Neurosurgery;  Laterality: N/A;  . LAPAROSCOPIC CHOLECYSTECTOMY  2010  . LUMBAR FUSION Right 06/08/2017   LUMBAR THREE-FOUR, LUMBAR FOUR-FIVE POSTEROLATERAL ARTHRODESIS WITH RIGHT LUMBAR FOUR-FIVE LAMINECTOMY/FORAMINOTOMY  . LUMBAR LAMINECTOMY/DECOMPRESSION MICRODISCECTOMY Right 03/08/2016   Procedure: Laminectomy and Foraminotomy - Lumbar Five-Sacral One Right;  Surgeon: Kary Kos, MD;  Location: [place];  Service: Neurosurgery;  Laterality: Right;  Right  . MULTIPLE TOOTH EXTRACTIONS     "3"  . POSTERIOR LUMBAR FUSION  10/14/2016   L5-S1  . REPLACEMENT TOTAL KNEE Left 2003  . THORACIC AORTIC ANEURYSM REPAIR  2010    Home Medications:  Allergies as of 01/16/2019      Reactions   Penicillins Hives, Swelling, Other (See Comments)   SWELLING REACTION UNSPECIFIED PATIENT HAS TAKEN AMOXICILLIN ON MED HX FROM [facility] PATIENT HAS HAD A PCN REACTION WITH IMMEDIATE RASH, FACIAL/TONGUE/THROAT SWELLING, SOB, OR LIGHTHEADEDNESS WITH HYPOTENSION:  #  #  #  YES  #  #  #   Has patient had a PCN reaction causing severe rash involving mucus membranes or skin necrosis: No Has patient had a PCN reaction that required hospitalization No Has patient had a PCN reaction occurring within the last 10 years: No   Demerol [meperidine] Hives, Nausea And Vomiting      Medication List       Accurate as of January 16, 2019  1:46 PM. If you have any questions, ask your nurse or doctor.        STOP taking these medications   Artificial Tears 0.2-0.2-1 % Soln Generic drug: Glycerin-Hypromellose-PEG 400 Stopped by: Shaneisha Burkel, PA-C   testosterone cypionate 200 MG/ML injection Commonly known as: DEPOTESTOSTERONE CYPIONATE Stopped by: Baden Betsch,  PA-C     TAKE these medications   amLODipine 10 MG tablet Commonly known as: NORVASC Take 1 tablet (10 mg total) by mouth daily.   aspirin 325 MG EC tablet Take 325 mg by mouth every evening.   atorvastatin 40 MG tablet Commonly known as: LIPITOR TAKE ONE TABLET BY MOUTH EVERY DAY What changed: when to take this   CALCARB 600 PO Take 600 mg by mouth daily.   cetirizine 10 MG tablet Commonly known as: ZYRTEC Take 10 mg by mouth daily.   cholecalciferol 25 MCG (1000 UT) tablet Commonly known as: VITAMIN D3 Take 1,000 Units by mouth daily.   fenofibrate 160 MG tablet Take 1 tablet (160 mg total) by mouth at bedtime.   FLUoxetine 20 MG capsule Commonly known as: PROZAC TAKE 1 CAPSULE (20MG ) BY MOUTH EVERY DAY What changed:   how much to take  how to take this  when to take this  additional instructions   HYDROcodone-acetaminophen 5-325 MG  tablet Commonly known as: Norco Take 1 tablet by mouth every 4 (four) hours as needed for moderate pain.   hydrocortisone cream 1 % Apply 1 application topically daily as needed for itching.   hydroxychloroquine 200 MG tablet Commonly known as: PLAQUENIL Take 200 mg by mouth every other day.   ibandronate 150 MG tablet Commonly known as: BONIVA Take 1 tablet (150 mg total) by mouth every 30 (thirty) days. Take with a glass of water, on an empty stomach.  Stay upright for 30 min.   lisinopril 20 MG tablet Commonly known as: ZESTRIL Take 1 tablet (20 mg total) by mouth every evening.   Omeprazole 20 MG Tbec Take 1 tablet (20 mg total) by mouth daily.   traZODone 50 MG tablet Commonly known as: DESYREL Take 1 or 2 tablet at bedtime prior to sleep as needed.   vitamin B-12 1000 MCG tablet Commonly known as: CYANOCOBALAMIN Take 1,000 mcg by mouth daily.       Allergies:  Allergies  Allergen Reactions  . Penicillins Hives, Swelling and Other (See Comments)    SWELLING REACTION UNSPECIFIED PATIENT HAS TAKEN  AMOXICILLIN ON MED HX FROM [facility] PATIENT HAS HAD A PCN REACTION WITH IMMEDIATE RASH, FACIAL/TONGUE/THROAT SWELLING, SOB, OR LIGHTHEADEDNESS WITH HYPOTENSION:  #  #  #  YES  #  #  #   Has patient had a PCN reaction causing severe rash involving mucus membranes or skin necrosis: No Has patient had a PCN reaction that required hospitalization No Has patient had a PCN reaction occurring within the last 10 years: No  . Demerol [Meperidine] Hives and Nausea And Vomiting    Family History: Family History  Problem Relation Age of Onset  . Heart failure Mother   . Hypertension Mother   . Asthma Mother   . Osteoporosis Mother   . Heart attack Father 67       MI  . Hypertension Sister   . Prostate cancer Neg Hx   . Bladder Cancer Neg Hx   . Kidney cancer Neg Hx   . Colon cancer Neg Hx     Social History:  reports that he quit smoking about 37 years ago. His smoking use included cigarettes. He has a 32.00 pack-year smoking history. He has never used smokeless tobacco. He reports current alcohol use. He reports that he does not use drugs.  ROS: UROLOGY Frequent Urination?: Yes Hard to postpone urination?: Yes Burning/pain with urination?: No Get up at night to urinate?: Yes Leakage of urine?: Yes Urine stream starts and stops?: No Trouble starting stream?: No Do you have to strain to urinate?: No Blood in urine?: No Urinary tract infection?: No Sexually transmitted disease?: No Injury to kidneys or bladder?: No Painful intercourse?: No Weak stream?: No Erection problems?: No Penile pain?: No  Gastrointestinal Nausea?: No Vomiting?: No Indigestion/heartburn?: No Diarrhea?: No Constipation?: No  Constitutional Fever: No Night sweats?: Yes Weight loss?: Yes Fatigue?: No  Skin Skin rash/lesions?: No Itching?: No  Eyes Blurred vision?: No Double vision?: No  Ears/Nose/Throat Sore throat?: No Sinus problems?: No  Hematologic/Lymphatic Swollen glands?: No Easy  bruising?: No  Cardiovascular Leg swelling?: No Chest pain?: No  Respiratory Cough?: No Shortness of breath?: No  Endocrine Excessive thirst?: No  Musculoskeletal Back pain?: Yes Joint pain?: Yes  Neurological Headaches?: No Dizziness?: No  Psychologic Depression?: No Anxiety?: Yes  Physical Exam: BP 115/75 (BP Location: Left Arm, Patient Position: Sitting, Cuff Size: Normal)   Pulse 76   Ht  6' (1.829 m)   Wt 165 lb 12.8 oz (75.2 kg)   BMI 22.49 kg/m   Constitutional:  Well nourished. Alert and oriented, No acute distress. HEENT: [place] AT, moist mucus membranes.  Trachea midline, no masses. Cardiovascular: No clubbing, cyanosis, or edema. Respiratory: Normal respiratory effort, no increased work of breathing. Neurologic: Grossly intact, no focal deficits, moving all 4 extremities. Psychiatric: Normal mood and affect.  Laboratory Data: Lab Results  Component Value Date   WBC 4.0 12/19/2018   HGB 12.0 (L) 12/25/2018   HCT 36.9 (L) 12/25/2018   MCV 95.9 12/19/2018   PLT 128 (L) 12/19/2018    Lab Results  Component Value Date   CREATININE 1.29 (H) 10/27/2018   PSA history:  <0.1 ng/mL on 01/17/2014    0.2 ng/mL on 08/07/2014    0.3 ng/mL on 02/17/2015    0.2 ng/mL on 07/09/2015  <0.2 ng/mL on 01/03/2017  <0.01 ng/mL on 09/09/2016  <0.1 ng/mL on 01/03/2018  0.2 ng/mL on 04/12/2018  Lab Results  Component Value Date   PSA <0.01 09/09/2016   Lab Results  Component Value Date   TESTOSTERONE >1500 (H) 12/25/2018   Lab Results  Component Value Date   TSH 0.892 12/19/2018   Lab Results  Component Value Date   AST 23 06/27/2017   Lab Results  Component Value Date   ALT 14 (L) 06/27/2017   Urinalysis    Component Value Date/Time   COLORURINE YELLOW 10/27/2018 [place] 10/27/2018 0644   APPEARANCEUR Clear 03/28/2014 1145   LABSPEC 1.023 10/27/2018 0644   LABSPEC 1.016 03/28/2014 1145   PHURINE 5.0 10/27/2018 [place] 10/27/2018 0644   GLUCOSEU Negative 03/28/2014 [place] 10/27/2018 [place] 10/27/2018 0644   BILIRUBINUR Small 11/21/2015 1620   BILIRUBINUR Negative 03/28/2014 1145   KETONESUR NEGATIVE 10/27/2018 0644   PROTEINUR 100 (A) 10/27/2018 0644   UROBILINOGEN 0.2 11/21/2015 1620   NITRITE NEGATIVE 10/27/2018 0644   LEUKOCYTESUR NEGATIVE 10/27/2018 0644   LEUKOCYTESUR Negative 03/28/2014 1145   I have reviewed the labs.  Assessment & Plan:    1. LUTS Given [facility] brochure   2. Urge incontinence  - Patient has discontinued Myrbetriq having found it ineffective - Explained to the patient that an alternate medication is oxybutynin; explained how it works and its side effects.  Patient declined to pursue. - Explained to the patient [facility] and he would like to think about this therapy  3. Testosterone deficiency Will reduce the testosterone cypionate to 0.25 cc every 2 weeks and recheck levels one week after his fourth injection   Return for testosterone level one week after his fourth injection .  Zara Council, PA-C  [facility] [address] [place],  [address] [phone]

## 2019-01-16 ENCOUNTER — Ambulatory Visit (INDEPENDENT_AMBULATORY_CARE_PROVIDER_SITE_OTHER): Payer: Medicare Other | Admitting: Urology

## 2019-01-16 ENCOUNTER — Encounter: Payer: Self-pay | Admitting: Urology

## 2019-01-16 ENCOUNTER — Other Ambulatory Visit: Payer: Self-pay

## 2019-01-16 VITALS — BP 115/75 | HR 76 | Ht 72.0 in | Wt 165.8 lb

## 2019-01-16 DIAGNOSIS — I6523 Occlusion and stenosis of bilateral carotid arteries: Secondary | ICD-10-CM

## 2019-01-16 DIAGNOSIS — E291 Testicular hypofunction: Secondary | ICD-10-CM

## 2019-01-17 DIAGNOSIS — M546 Pain in thoracic spine: Secondary | ICD-10-CM | POA: Diagnosis not present

## 2019-01-18 DIAGNOSIS — I10 Essential (primary) hypertension: Secondary | ICD-10-CM | POA: Diagnosis not present

## 2019-01-18 DIAGNOSIS — S22070A Wedge compression fracture of T9-T10 vertebra, initial encounter for closed fracture: Secondary | ICD-10-CM | POA: Diagnosis not present

## 2019-01-24 ENCOUNTER — Other Ambulatory Visit: Payer: Self-pay | Admitting: Neurosurgery

## 2019-01-24 ENCOUNTER — Other Ambulatory Visit: Payer: Self-pay | Admitting: Urology

## 2019-01-24 ENCOUNTER — Other Ambulatory Visit: Payer: Self-pay

## 2019-01-24 ENCOUNTER — Ambulatory Visit (INDEPENDENT_AMBULATORY_CARE_PROVIDER_SITE_OTHER): Payer: Medicare Other | Admitting: Family Medicine

## 2019-01-24 VITALS — BP 109/69 | HR 62 | Wt 167.0 lb

## 2019-01-24 DIAGNOSIS — G4700 Insomnia, unspecified: Secondary | ICD-10-CM

## 2019-01-24 DIAGNOSIS — M51369 Other intervertebral disc degeneration, lumbar region without mention of lumbar back pain or lower extremity pain: Secondary | ICD-10-CM

## 2019-01-24 DIAGNOSIS — M5136 Other intervertebral disc degeneration, lumbar region: Secondary | ICD-10-CM

## 2019-01-24 DIAGNOSIS — F4329 Adjustment disorder with other symptoms: Secondary | ICD-10-CM

## 2019-01-24 DIAGNOSIS — F4321 Adjustment disorder with depressed mood: Secondary | ICD-10-CM

## 2019-01-24 DIAGNOSIS — F419 Anxiety disorder, unspecified: Secondary | ICD-10-CM | POA: Diagnosis not present

## 2019-01-24 DIAGNOSIS — S22070A Wedge compression fracture of T9-T10 vertebra, initial encounter for closed fracture: Secondary | ICD-10-CM

## 2019-01-24 DIAGNOSIS — F4381 Prolonged grief disorder: Secondary | ICD-10-CM

## 2019-01-24 MED ORDER — TRAZODONE HCL 50 MG PO TABS: 50.0000 mg | ORAL_TABLET | Freq: Every day | ORAL | 1 refills | Status: DC

## 2019-01-24 MED ORDER — FLUOXETINE HCL 20 MG PO CAPS: 20.0000 mg | ORAL_CAPSULE | Freq: Every day | ORAL | 1 refills | Status: DC

## 2019-01-24 NOTE — Progress Notes (Signed)
Patient ID: Bryan [name], male   DOB: 12-27-1940, 78 y.o.   MRN: [id number]    Virtual Visit via phone Note  This visit type was conducted due to national recommendations for restrictions regarding the COVID-19 pandemic (e.g. social distancing).  This format is felt to be most appropriate for this patient at this time.  All issues noted in this document were discussed and addressed.  No physical exam was performed (except for noted visual exam findings with Video Visits).   I connected with [name] today at 11:20 AM EDT by a video enabled telemedicine application or telephone and verified that I am speaking with the correct person using two identifiers. Location patient: home Location provider: work or home office Persons participating in the virtual visit: patient, provider  I discussed the limitations, risks, security and privacy concerns of performing an evaluation and management service by telephone and the availability of in person appointments. I also discussed with the patient that there may be a patient responsible charge related to this service. The patient expressed understanding and agreed to proceed.  HPI:  Patient and I connected via telephone to discuss his chronic back pain and referral to pain clinic.  Patient was having a difficult time getting in to the pain clinic after he has had multiple back surgeries and treatments through his neurosurgeons office, he was finally able to get an appointment at the [facility] imaging pain management clinic and will be getting back injections.  This appointment is on 14 September, patient is looking forward to this.  Also patient would like refill of his Prozac and trazodone.  He does use the Prozac and trazodone to help his mood as well as his sleep, still struggles with the loss of his wife and misses her every day.  Does go to a grief counseling support group through the [employer], enjoys this group and really feels  they help him and also feels his contributions to the group help others deal with the loss of loved ones.  Denies SI or HI.    ROS: See pertinent positives and negatives per HPI.  Past Medical History:  Diagnosis Date  . Abnormal CT scan    Asymmetric left rectal wall thickening   . Anemia   . Anxiety   . Arrhythmia   . Chronic lower back pain   . Complication of anesthesia    Memory loss 09/2015  . Coronary artery disease   . Depression   . GERD (gastroesophageal reflux disease)   . Glaucoma   . H/O thoracic aortic aneurysm repair   . Heart murmur   . History of being hospitalized    memory lose kidney funtion down blood pressure up  . History of blood transfusion    "think he had one when he had heart valve OR" (10/14/2016); "none since" (10/19/2017)  . History of chicken pox   . Hyperlipidemia   . Hypertension   . Lumbar stenosis   . Murmur   . Neuropathy   . Osteoarthritis    worse in feet and ankles  . Paroxysmal A-fib ([place])   . Poor short term memory    takes Aricept  . Rheumatoid arthritis ([place])    "all over" (10/14/2016)  . Schizophrenia ([place])   . Seasonal allergies   . Valvular heart disease     Past Surgical History:  Procedure Laterality Date  . AORTIC VALVE REPLACEMENT  2007   [facility].  Supply, [place]; "pig valve"  .  APPENDECTOMY  2010  . BACK SURGERY    . CARDIAC VALVE REPLACEMENT    . CATARACT EXTRACTION W/ INTRAOCULAR LENS  IMPLANT, BILATERAL Bilateral 2012  . COLONOSCOPY WITH PROPOFOL N/A 09/24/2016   Procedure: COLONOSCOPY WITH PROPOFOL;  Surgeon: Jonathon Bellows, MD;  Location: [facility] ENDOSCOPY;  Service: Endoscopy;  Laterality: N/A;  . ESOPHAGOGASTRODUODENOSCOPY (EGD) WITH PROPOFOL N/A 09/24/2016   Procedure: ESOPHAGOGASTRODUODENOSCOPY (EGD) WITH PROPOFOL;  Surgeon: Jonathon Bellows, MD;  [facility] ENDOSCOPY;  Service: Endoscopy;  Laterality: N/A;  . GIVENS CAPSULE STUDY N/A 09/24/2016   Procedure: GIVENS CAPSULE STUDY;  Surgeon: Jonathon Bellows, MD;   Location: [facility] ENDOSCOPY;  Service: Endoscopy;  Laterality: N/A;  . HAMMER TOE SURGERY Right 10/09/2015   Procedure: HAMMER TOE REPAIR WITH K-WIRE FIXATION RIGHT SECOND TOE;  Surgeon: Albertine Patricia, DPM;  Location: [place];  Service: Podiatry;  Laterality: Right;  WITH LOCAL  . INGUINAL HERNIA REPAIR Right 05/05/2018   Medium Bard PerFix plug.  Surgeon: Robert Bellow, MD;  Location: [facility] ORS;  Service: General;  Laterality: Right;  . IR VERTEBROPLASTY CERV/THOR BX INC UNI/BIL INC/INJECT/IMAGING  10/27/2018  . JOINT REPLACEMENT Left    left total knee  . LAMINECTOMY WITH POSTERIOR LATERAL ARTHRODESIS LEVEL 3 N/A 09/28/2017   Procedure: LUMBAR  POSTEROR  FUSION REVISION - LUMBAR ONE -TWO, LUMBAR TWO -THREE,THREE-FOUR ,BILATERAL ARTHRODESIS REMOVAL LUMBAR THREE HARDWARE;  Surgeon: Kary Kos, MD;  Location: [place];  Service: Neurosurgery;  Laterality: N/A;  . LAMINECTOMY WITH POSTERIOR LATERAL ARTHRODESIS LEVEL 3 N/A 10/20/2017   Procedure: Revision fusion and removal of hardware Lumbar one, Pedicle screw fixation thoracic ten-lumbar two, thoracic nine-ten laminotomy, Posterior Lumbar Arthrodesis thoracic ten-lumbar two ;  Surgeon: Kary Kos, MD;  Location: [place];  Service: Neurosurgery;  Laterality: N/A;  . LAPAROSCOPIC CHOLECYSTECTOMY  2010  . LUMBAR FUSION Right 06/08/2017   LUMBAR THREE-FOUR, LUMBAR FOUR-FIVE POSTEROLATERAL ARTHRODESIS WITH RIGHT LUMBAR FOUR-FIVE LAMINECTOMY/FORAMINOTOMY  . LUMBAR LAMINECTOMY/DECOMPRESSION MICRODISCECTOMY Right 03/08/2016   Procedure: Laminectomy and Foraminotomy - Lumbar Five-Sacral One Right;  Surgeon: Kary Kos, MD;  Location: [place];  Service: Neurosurgery;  Laterality: Right;  Right  . MULTIPLE TOOTH EXTRACTIONS     "3"  . POSTERIOR LUMBAR FUSION  10/14/2016   L5-S1  . REPLACEMENT TOTAL KNEE Left 2003  . THORACIC AORTIC ANEURYSM REPAIR  2010    Family History  Problem Relation Age of Onset  . Heart failure Mother   . Hypertension Mother    . Asthma Mother   . Osteoporosis Mother   . Heart attack Father 30       MI  . Hypertension Sister   . Prostate cancer Neg Hx   . Bladder Cancer Neg Hx   . Kidney cancer Neg Hx   . Colon cancer Neg Hx    Social History   Tobacco Use  . Smoking status: Former Smoker    Packs/day: 1.00    Years: 32.00    Pack years: 32.00    Types: Cigarettes    Quit date: 07/05/1981    Years since quitting: 37.5  . Smokeless tobacco: Never Used  Substance Use Topics  . Alcohol use: Yes    Comment: 10/19/2017 "glass of wine/month; if that"    Current Outpatient Medications:  .  aspirin 325 MG EC tablet, Take 325 mg by mouth every evening. , Disp: , Rfl:  .  atorvastatin (LIPITOR) 40 MG tablet, TAKE ONE TABLET BY MOUTH EVERY DAY (Patient taking differently: Take 40 mg by mouth  at bedtime. ), Disp: 90 tablet, Rfl: 3 .  Calcium Carbonate (CALCARB 600 PO), Take 600 mg by mouth daily., Disp: , Rfl:  .  cetirizine (ZYRTEC) 10 MG tablet, Take 10 mg by mouth daily., Disp: , Rfl:  .  cholecalciferol (VITAMIN D3) 25 MCG (1000 UT) tablet, Take 1,000 Units by mouth daily., Disp: , Rfl:  .  fenofibrate 160 MG tablet, Take 1 tablet (160 mg total) by mouth at bedtime., Disp: 90 tablet, Rfl: 3 .  FLUoxetine (PROZAC) 20 MG capsule, TAKE 1 CAPSULE (20MG) BY MOUTH EVERY DAY (Patient taking differently: Take 20 mg by mouth daily. ), Disp: 90 capsule, Rfl: 1 .  HYDROcodone-acetaminophen (NORCO) 5-325 MG tablet, Take 1 tablet by mouth every 4 (four) hours as needed for moderate pain., Disp: 15 tablet, Rfl: 0 .  hydrocortisone cream 1 %, Apply 1 application topically daily as needed for itching., Disp: , Rfl:  .  hydroxychloroquine (PLAQUENIL) 200 MG tablet, Take 200 mg by mouth every other day. , Disp: , Rfl:  .  ibandronate (BONIVA) 150 MG tablet, Take 1 tablet (150 mg total) by mouth every 30 (thirty) days. Take with a glass of water, on an empty stomach.  Stay upright for 30 min., Disp: 3 tablet, Rfl: 3 .   lisinopril (PRINIVIL,ZESTRIL) 20 MG tablet, Take 1 tablet (20 mg total) by mouth every evening., Disp: 90 tablet, Rfl: 3 .  Omeprazole 20 MG TBEC, Take 1 tablet (20 mg total) by mouth daily., Disp: 30 each, Rfl: 11 .  traZODone (DESYREL) 50 MG tablet, Take 1 or 2 tablet at bedtime prior to sleep as needed., Disp: 30 tablet, Rfl: 2 .  vitamin B-12 (CYANOCOBALAMIN) 1000 MCG tablet, Take 1,000 mcg by mouth daily., Disp: , Rfl:  .  amLODipine (NORVASC) 10 MG tablet, Take 1 tablet (10 mg total) by mouth daily., Disp: 180 tablet, Rfl: 3  EXAM:  GENERAL: alert, oriented, sounds well and in no acute distress  LUNGS: speaking in full sentences, no signs of respiratory distress, breathing rate appears normal, no obvious gross SOB, gasping or wheezing  PSYCH/NEURO: pleasant and cooperative, no obvious depression or anxiety, speech and thought processing grossly intact  ASSESSMENT AND PLAN:  Discussed the following assessment and plan:  Lumbar radiculopathy/chronic back pain-patient initially made this appointment to see if we could refer him to a different pain clinic due to having difficulty getting in however he does now have a upcoming pain clinic appointment and he is very much looking forward to this on September 14.  He will continue to work with his neurosurgeon and pain clinic to help manage his chronic pain.  Bereavement/insomnia/anxiety and depression-patient's mood is helped by taking Prozac.  He also has found good support and companionship through the friends and remembers he has met in the grief counseling group he attends through his church.  He will continue to attend the sessions.  Patient will continue Prozac daily and use trazodone at bedtime to help sleep, refills sent in.   I discussed the assessment and treatment plan with the patient. The patient was provided an opportunity to ask questions and all were answered. The patient agreed with the plan and demonstrated an understanding of  the instructions.   The patient was advised to call back or seek an in-person evaluation if the symptoms worsen or if the condition fails to improve as anticipated.  I provided 15 minutes of non-face-to-face time during this encounter.   Jodelle Green, FNP

## 2019-01-26 ENCOUNTER — Other Ambulatory Visit: Payer: Self-pay

## 2019-01-26 ENCOUNTER — Encounter: Payer: Self-pay | Admitting: Endocrinology

## 2019-01-26 ENCOUNTER — Ambulatory Visit (INDEPENDENT_AMBULATORY_CARE_PROVIDER_SITE_OTHER): Payer: Medicare Other | Admitting: Endocrinology

## 2019-01-26 VITALS — BP 130/80 | HR 67 | Ht 72.0 in | Wt 167.4 lb

## 2019-01-26 DIAGNOSIS — M8000XG Age-related osteoporosis with current pathological fracture, unspecified site, subsequent encounter for fracture with delayed healing: Secondary | ICD-10-CM

## 2019-01-26 DIAGNOSIS — M810 Age-related osteoporosis without current pathological fracture: Secondary | ICD-10-CM | POA: Insufficient documentation

## 2019-01-26 DIAGNOSIS — I6523 Occlusion and stenosis of bilateral carotid arteries: Secondary | ICD-10-CM

## 2019-01-26 NOTE — Progress Notes (Signed)
Subjective:    Patient ID: Bryan [name], male    DOB: [birth year], 78 y.o.   MRN: [id number]  HPI Pt returns for f/u of osteoporosis: Dx'ed: 2018 Secondary causes: prednisone (for RA), and low testosterone.   Fractures: mult spinal comp fxs.   Past rx: none Current rx: ibandronate.   Last DEXA result (2019): R hip T-score is -3.1. Other: GERD precludes alendronate; he declined Reclast.  Interval hx: He had a new comp fx a few mos ago.  Otherwise, pt states he feels well in general.   Past Medical History:  Diagnosis Date  . Abnormal CT scan    Asymmetric left rectal wall thickening   . Anemia   . Anxiety   . Arrhythmia   . Chronic lower back pain   . Complication of anesthesia    Memory loss 09/2015  . Coronary artery disease   . Depression   . GERD (gastroesophageal reflux disease)   . Glaucoma   . H/O thoracic aortic aneurysm repair   . Heart murmur   . History of being hospitalized    memory lose kidney funtion down blood pressure up  . History of blood transfusion    "think he had one when he had heart valve OR" (10/14/2016); "none since" (10/19/2017)  . History of chicken pox   . Hyperlipidemia   . Hypertension   . Lumbar stenosis   . Murmur   . Neuropathy   . Osteoarthritis    worse in feet and ankles  . Paroxysmal A-fib ([place])   . Poor short term memory    takes Aricept  . Rheumatoid arthritis ([place])    "all over" (10/14/2016)  . Schizophrenia ([place])   . Seasonal allergies   . Valvular heart disease     Past Surgical History:  Procedure Laterality Date  . AORTIC VALVE REPLACEMENT  2007   [facility].  Supply, [place]; "pig valve"  . APPENDECTOMY  2010  . BACK SURGERY    . CARDIAC VALVE REPLACEMENT    . CATARACT EXTRACTION W/ INTRAOCULAR LENS  IMPLANT, BILATERAL Bilateral 2012  . COLONOSCOPY WITH PROPOFOL N/A 09/24/2016   Procedure: COLONOSCOPY WITH PROPOFOL;  Surgeon: Jonathon Bellows, MD;  Location: [facility] ENDOSCOPY;  Service: Endoscopy;  Laterality: N/A;   . ESOPHAGOGASTRODUODENOSCOPY (EGD) WITH PROPOFOL N/A 09/24/2016   Procedure: ESOPHAGOGASTRODUODENOSCOPY (EGD) WITH PROPOFOL;  Surgeon: Jonathon Bellows, MD;  Location: [facility] ENDOSCOPY;  Service: Endoscopy;  Laterality: N/A;  . GIVENS CAPSULE STUDY N/A 09/24/2016   Procedure: GIVENS CAPSULE STUDY;  Surgeon: Jonathon Bellows, MD;  Location: [facility] ENDOSCOPY;  Service: Endoscopy;  Laterality: N/A;  . HAMMER TOE SURGERY Right 10/09/2015   Procedure: HAMMER TOE REPAIR WITH K-WIRE FIXATION RIGHT SECOND TOE;  Surgeon: Albertine Patricia, DPM;  Location: [place];  Service: Podiatry;  Laterality: Right;  WITH LOCAL  . INGUINAL HERNIA REPAIR Right 05/05/2018   Medium Bard PerFix plug.  Surgeon: Robert Bellow, MD;  Location: [facility] ORS;  Service: General;  Laterality: Right;  . IR VERTEBROPLASTY CERV/THOR BX INC UNI/BIL INC/INJECT/IMAGING  10/27/2018  . JOINT REPLACEMENT Left    left total knee  . LAMINECTOMY WITH POSTERIOR LATERAL ARTHRODESIS LEVEL 3 N/A 09/28/2017   Procedure: LUMBAR  POSTEROR  FUSION REVISION - LUMBAR ONE -TWO, LUMBAR TWO -THREE,THREE-FOUR ,BILATERAL ARTHRODESIS REMOVAL LUMBAR THREE HARDWARE;  Surgeon: Kary Kos, MD;  Location: [place];  Service: Neurosurgery;  Laterality: N/A;  . LAMINECTOMY WITH POSTERIOR LATERAL ARTHRODESIS LEVEL 3 N/A 10/20/2017   Procedure: Revision  fusion and removal of hardware Lumbar one, Pedicle screw fixation thoracic ten-lumbar two, thoracic nine-ten laminotomy, Posterior Lumbar Arthrodesis thoracic ten-lumbar two ;  Surgeon: Kary Kos, MD;  Location: [place];  Service: Neurosurgery;  Laterality: N/A;  . LAPAROSCOPIC CHOLECYSTECTOMY  2010  . LUMBAR FUSION Right 06/08/2017   LUMBAR THREE-FOUR, LUMBAR FOUR-FIVE POSTEROLATERAL ARTHRODESIS WITH RIGHT LUMBAR FOUR-FIVE LAMINECTOMY/FORAMINOTOMY  . LUMBAR LAMINECTOMY/DECOMPRESSION MICRODISCECTOMY Right 03/08/2016   Procedure: Laminectomy and Foraminotomy - Lumbar Five-Sacral One Right;  Surgeon: Kary Kos, MD;  Location: [place];   Service: Neurosurgery;  Laterality: Right;  Right  . MULTIPLE TOOTH EXTRACTIONS     "3"  . POSTERIOR LUMBAR FUSION  10/14/2016   L5-S1  . REPLACEMENT TOTAL KNEE Left 2003  . THORACIC AORTIC ANEURYSM REPAIR  2010    Social History   Socioeconomic History  . Marital status: Widowed    Spouse name: Not on file  . Number of children: Not on file  . Years of education: Not on file  . Highest education level: Not on file  Occupational History  . Occupation: retired  Scientific laboratory technician  . Financial resource strain: Not hard at all  . Food insecurity    Worry: Never true    Inability: Never true  . Transportation needs    Medical: No    Non-medical: No  Tobacco Use  . Smoking status: Former Smoker    Packs/day: 1.00    Years: 32.00    Pack years: 32.00    Types: Cigarettes    Quit date: 07/05/1981    Years since quitting: 37.5  . Smokeless tobacco: Never Used  Substance and Sexual Activity  . Alcohol use: Yes    Comment: 10/19/2017 "glass of wine/month; if that"  . Drug use: Never  . Sexual activity: Not Currently  Lifestyle  . Physical activity    Days per week: Not on file    Minutes per session: Not on file  . Stress: Not at all  Relationships  . Social Herbalist on phone: Not on file    Gets together: Not on file    Attends religious service: Not on file    Active member of club or organization: Not on file    Attends meetings of clubs or organizations: Not on file    Relationship status: Not on file  . Intimate partner violence    Fear of current or ex partner: Not on file    Emotionally abused: Not on file    Physically abused: Not on file    Forced sexual activity: Not on file  Other Topics Concern  . Not on file  Social History Narrative  . Not on file    Current Outpatient Medications on File Prior to Visit  Medication Sig Dispense Refill  . aspirin 325 MG EC tablet Take 325 mg by mouth every evening.     Marland Kitchen atorvastatin (LIPITOR) 40 MG tablet  TAKE ONE TABLET BY MOUTH EVERY DAY (Patient taking differently: Take 40 mg by mouth at bedtime. ) 90 tablet 3  . Calcium Carbonate (CALCARB 600 PO) Take 600 mg by mouth daily.    . cetirizine (ZYRTEC) 10 MG tablet Take 10 mg by mouth daily.    . cholecalciferol (VITAMIN D3) 25 MCG (1000 UT) tablet Take 1,000 Units by mouth daily.    . fenofibrate 160 MG tablet Take 1 tablet (160 mg total) by mouth at bedtime. 90 tablet 3  . FLUoxetine (PROZAC) 20 MG capsule Take  1 capsule (20 mg total) by mouth daily. 90 capsule 1  . HYDROcodone-acetaminophen (NORCO) 5-325 MG tablet Take 1 tablet by mouth every 4 (four) hours as needed for moderate pain. 15 tablet 0  . hydrocortisone cream 1 % Apply 1 application topically daily as needed for itching.    . hydroxychloroquine (PLAQUENIL) 200 MG tablet Take 200 mg by mouth every other day.     . ibandronate (BONIVA) 150 MG tablet Take 1 tablet (150 mg total) by mouth every 30 (thirty) days. Take with a glass of water, on an empty stomach.  Stay upright for 30 min. 3 tablet 3  . lisinopril (PRINIVIL,ZESTRIL) 20 MG tablet Take 1 tablet (20 mg total) by mouth every evening. 90 tablet 3  . Omeprazole 20 MG TBEC Take 1 tablet (20 mg total) by mouth daily. 30 each 11  . traZODone (DESYREL) 50 MG tablet Take 1 tablet (50 mg total) by mouth at bedtime. May take 2 tablets at bedtime if needed. 90 tablet 1  . vitamin B-12 (CYANOCOBALAMIN) 1000 MCG tablet Take 1,000 mcg by mouth daily.    Marland Kitchen amLODipine (NORVASC) 10 MG tablet Take 1 tablet (10 mg total) by mouth daily. 180 tablet 3   No current facility-administered medications on file prior to visit.     Allergies  Allergen Reactions  . Penicillins Hives, Swelling and Other (See Comments)    SWELLING REACTION UNSPECIFIED PATIENT HAS TAKEN AMOXICILLIN ON MED HX FROM [facility] PATIENT HAS HAD A PCN REACTION WITH IMMEDIATE RASH, FACIAL/TONGUE/THROAT SWELLING, SOB, OR LIGHTHEADEDNESS WITH HYPOTENSION:  #  #  #  YES  #  #  #   Has  patient had a PCN reaction causing severe rash involving mucus membranes or skin necrosis: No Has patient had a PCN reaction that required hospitalization No Has patient had a PCN reaction occurring within the last 10 years: No  . Demerol [Meperidine] Hives and Nausea And Vomiting    Family History  Problem Relation Age of Onset  . Heart failure Mother   . Hypertension Mother   . Asthma Mother   . Osteoporosis Mother   . Heart attack Father 48       MI  . Hypertension Sister   . Prostate cancer Neg Hx   . Bladder Cancer Neg Hx   . Kidney cancer Neg Hx   . Colon cancer Neg Hx     BP 130/80 (BP Location: Right Arm, Patient Position: Sitting, Cuff Size: Normal)   Pulse 67   Ht 6' (1.829 m)   Wt 167 lb 6.4 oz (75.9 kg)   SpO2 96%   BMI 22.70 kg/m   Review of Systems Denies falls.      Objective:   Physical Exam VITAL SIGNS:  See vs page GENERAL: no distress Chest wall: no kyphosis       Assessment & Plan:  Osteoporosis: worse per new spinal fx  Patient Instructions  Please continue the same medications.  Let's recheck the bone density Take calcium 1200 mg per day, and vitamin-D, 1000 units per day.   Please come back for a follow-up appointment in 1 year.

## 2019-01-26 NOTE — Patient Instructions (Addendum)
Please continue the same medications.  Let's recheck the bone density Take calcium 1200 mg per day, and vitamin-D, 1000 units per day.   Please come back for a follow-up appointment in 1 year.

## 2019-01-28 NOTE — Progress Notes (Signed)
According to appointment statistics, patient was arrived on 12/20/2018 then rescheduled to see MD on 12/21/2018 (confirmed appt is completed this date).  MDs note on 12/20/2018 deleted to be able to close encounter.

## 2019-02-05 ENCOUNTER — Other Ambulatory Visit: Payer: Medicare Other

## 2019-02-05 ENCOUNTER — Inpatient Hospital Stay: Admission: RE | Admit: 2019-02-05 | Payer: Medicare Other | Source: Ambulatory Visit

## 2019-02-07 ENCOUNTER — Ambulatory Visit (INDEPENDENT_AMBULATORY_CARE_PROVIDER_SITE_OTHER): Payer: Medicare Other

## 2019-02-07 ENCOUNTER — Other Ambulatory Visit: Payer: Self-pay

## 2019-02-07 DIAGNOSIS — I480 Paroxysmal atrial fibrillation: Secondary | ICD-10-CM | POA: Diagnosis not present

## 2019-02-07 DIAGNOSIS — Z952 Presence of prosthetic heart valve: Secondary | ICD-10-CM | POA: Diagnosis not present

## 2019-02-09 ENCOUNTER — Other Ambulatory Visit: Payer: Self-pay

## 2019-02-09 ENCOUNTER — Ambulatory Visit
Admission: RE | Admit: 2019-02-09 | Discharge: 2019-02-09 | Disposition: A | Discharge status: Home / Self Care | Payer: Medicare Other | Source: Ambulatory Visit | Attending: Neurosurgery | Admitting: Neurosurgery

## 2019-02-09 ENCOUNTER — Other Ambulatory Visit: Payer: Self-pay | Admitting: Neurosurgery

## 2019-02-09 DIAGNOSIS — S22070A Wedge compression fracture of T9-T10 vertebra, initial encounter for closed fracture: Secondary | ICD-10-CM

## 2019-02-09 DIAGNOSIS — Z0389 Encounter for observation for other suspected diseases and conditions ruled out: Secondary | ICD-10-CM | POA: Diagnosis not present

## 2019-02-09 MED ORDER — METHYLPREDNISOLONE ACETATE 40 MG/ML INJ SUSP (RADIOLOG
120.0000 mg | Freq: Once | INTRAMUSCULAR | Status: AC
  Administered 2019-02-09: 11:00:00 120 mg via INTRA_ARTICULAR

## 2019-02-09 NOTE — Discharge Instructions (Signed)

## 2019-02-12 ENCOUNTER — Telehealth: Payer: Self-pay | Admitting: Cardiovascular Disease

## 2019-02-12 NOTE — Telephone Encounter (Signed)
Patient calling to discuss recent testing results  ° °Please call  ° °

## 2019-02-12 NOTE — Telephone Encounter (Signed)
Patient's echo is awaiting review and signature from the ordering physician. Message fwd to [place].

## 2019-02-15 ENCOUNTER — Telehealth: Payer: Self-pay | Admitting: *Deleted

## 2019-02-15 NOTE — Telephone Encounter (Signed)
-----   Message from Minna Merritts, MD sent at 02/14/2019 10:34 PM EDT ----- Echo Aortic valve prosthesis looks ok, gradient unchanged from 2017 echo Normal LV function Ascending aorta has not changed, actually measured smaller this study than before , 2017 Overall good news

## 2019-02-15 NOTE — Telephone Encounter (Signed)
Patient informed. Copy sent to PCP °

## 2019-02-19 ENCOUNTER — Telehealth: Payer: Self-pay | Admitting: Endocrinology

## 2019-02-19 NOTE — Telephone Encounter (Signed)
Returned pt call. Left detailed VM informing pt that his DEXA has been ordered for [facility]. This is not a location we schedule for as this order goes in to their work queue for [facility] to schedule. Advised he will need to call [phone] follow up on the status of his appt OR to schedule his appt.

## 2019-02-19 NOTE — Telephone Encounter (Signed)
Patient was calling for an update for his bone density scan referral that Dr.Ellison was going to put in for him.  Please Advise, Thanks

## 2019-02-21 DIAGNOSIS — Z23 Encounter for immunization: Secondary | ICD-10-CM | POA: Diagnosis not present

## 2019-02-22 ENCOUNTER — Other Ambulatory Visit: Payer: Self-pay

## 2019-02-22 ENCOUNTER — Encounter: Payer: Self-pay | Admitting: Urology

## 2019-02-22 ENCOUNTER — Ambulatory Visit (INDEPENDENT_AMBULATORY_CARE_PROVIDER_SITE_OTHER): Payer: Medicare Other | Admitting: Urology

## 2019-02-22 VITALS — BP 92/58 | HR 67 | Ht 72.0 in | Wt 163.8 lb

## 2019-02-22 DIAGNOSIS — I6523 Occlusion and stenosis of bilateral carotid arteries: Secondary | ICD-10-CM

## 2019-02-22 DIAGNOSIS — N3941 Urge incontinence: Secondary | ICD-10-CM | POA: Diagnosis not present

## 2019-02-22 DIAGNOSIS — E291 Testicular hypofunction: Secondary | ICD-10-CM | POA: Diagnosis not present

## 2019-02-22 NOTE — Progress Notes (Signed)
08/03/2018 12:56 PM   OLIVER MORESI March 04, 1941 [id number]  Referring provider: [name], MD [address] [place],  [place] [address]  Chief Complaint  Patient presents with  . Benign Prostatic Hypertrophy    HPI: Bryan [name] is a 78 y.o. male with testosterone deficiency, erectile dysfunction and BPH with LUTS who presents today for follow up.  He recently became a widower as his wife died 2018-07-28.  Testosterone deficiency Patient was advised on 04/12/2018 to continue his testosterone therapy as he is anemic and has osteoporosis.  He is currently having 0.5 cc of 200mg /cc of testosterone cypionate injected by [facility] every 14 days.  His levels were found to be > 1500 on 12/25/2018.   He was instructed to discontinue the injections, but he continued to receive the injections through [place].    He is still experiencing frequency, urgency, nocturia and incontinence.  He states it is somewhat better.  Patient denies any gross hematuria, dysuria or suprapubic/flank pain.  Patient denies any fevers, chills, nausea or vomiting.  His PVR's have been minimal.  He had a trial of Myrbetriq for these symptoms, but he found the medication ineffective.  He is not wanting to try a medication from the anticholinergic family due to their side effect profile and being medication listed on the BEERS list.    PMH: Past Medical History:  Diagnosis Date  . Abnormal CT scan    Asymmetric left rectal wall thickening   . Anemia   . Anxiety   . Arrhythmia   . Chronic lower back pain   . Complication of anesthesia    Memory loss 09/2015  . Coronary artery disease   . Depression   . GERD (gastroesophageal reflux disease)   . Glaucoma   . H/O thoracic aortic aneurysm repair   . Heart murmur   . History of being hospitalized    memory lose kidney funtion down blood pressure up  . History of blood transfusion    "think he had one when he had  heart valve OR" (10/14/2016); "none since" (10/19/2017)  . History of chicken pox   . Hyperlipidemia   . Hypertension   . Lumbar stenosis   . Murmur   . Neuropathy   . Osteoarthritis    worse in feet and ankles  . Paroxysmal A-fib ([place])   . Poor short term memory    takes Aricept  . Rheumatoid arthritis ([place])    "all over" (10/14/2016)  . Schizophrenia ([place])   . Seasonal allergies   . Valvular heart disease     Surgical History: Past Surgical History:  Procedure Laterality Date  . AORTIC VALVE REPLACEMENT  2007   [facility].  [place]ly, [place]; "pig valve"  . APPENDECTOMY  2010  . BACK SURGERY    . CARDIAC VALVE REPLACEMENT    . CATARACT EXTRACTION W/ INTRAOCULAR LENS  IMPLANT, BILATERAL Bilateral 2012  . COLONOSCOPY WITH PROPOFOL N/A 09/24/2016   Procedure: COLONOSCOPY WITH PROPOFOL;  Surgeon: Jonathon Bellows, MD;  Location: [facility] ENDOSCOPY;  Service: Endoscopy;  Laterality: N/A;  . ESOPHAGOGASTRODUODENOSCOPY (EGD) WITH PROPOFOL N/A 09/24/2016   Procedure: ESOPHAGOGASTRODUODENOSCOPY (EGD) WITH PROPOFOL;  Surgeon: Jonathon Bellows, MD;  Location: [facility] ENDOSCOPY;  Service: Endoscopy;  Laterality: N/A;  . GIVENS CAPSULE STUDY N/A 09/24/2016   Procedure: GIVENS CAPSULE STUDY;  Surgeon: Jonathon Bellows, MD;  Location: [facility] ENDOSCOPY;  Service: Endoscopy;  Laterality: N/A;  . HAMMER TOE SURGERY Right 10/09/2015  Procedure: HAMMER TOE REPAIR WITH K-WIRE FIXATION RIGHT SECOND TOE;  Surgeon: Albertine Patricia, DPM;  Location: [place];  Service: Podiatry;  Laterality: Right;  WITH LOCAL  . INGUINAL HERNIA REPAIR Right 05/05/2018   Medium Bard PerFix plug.  Surgeon: Robert Bellow, MD;  Location: [facility] ORS;  Service: General;  Laterality: Right;  . IR VERTEBROPLASTY CERV/THOR BX INC UNI/BIL INC/INJECT/IMAGING  10/27/2018  . JOINT REPLACEMENT Left    left total knee  . LAMINECTOMY WITH POSTERIOR LATERAL ARTHRODESIS LEVEL 3 N/A 09/28/2017   Procedure: LUMBAR  POSTEROR  FUSION REVISION - LUMBAR ONE  -TWO, LUMBAR TWO -THREE,THREE-FOUR ,BILATERAL ARTHRODESIS REMOVAL LUMBAR THREE HARDWARE;  Surgeon: Kary Kos, MD;  Location: [place];  Service: Neurosurgery;  Laterality: N/A;  . LAMINECTOMY WITH POSTERIOR LATERAL ARTHRODESIS LEVEL 3 N/A 10/20/2017   Procedure: Revision fusion and removal of hardware Lumbar one, Pedicle screw fixation thoracic ten-lumbar two, thoracic nine-ten laminotomy, Posterior Lumbar Arthrodesis thoracic ten-lumbar two ;  Surgeon: Kary Kos, MD;  Location: [place];  Service: Neurosurgery;  Laterality: N/A;  . LAPAROSCOPIC CHOLECYSTECTOMY  2010  . LUMBAR FUSION Right 06/08/2017   LUMBAR THREE-FOUR, LUMBAR FOUR-FIVE POSTEROLATERAL ARTHRODESIS WITH RIGHT LUMBAR FOUR-FIVE LAMINECTOMY/FORAMINOTOMY  . LUMBAR LAMINECTOMY/DECOMPRESSION MICRODISCECTOMY Right 03/08/2016   Procedure: Laminectomy and Foraminotomy - Lumbar Five-Sacral One Right;  Surgeon: Kary Kos, MD;  Location: [place];  Service: Neurosurgery;  Laterality: Right;  Right  . MULTIPLE TOOTH EXTRACTIONS     "3"  . POSTERIOR LUMBAR FUSION  10/14/2016   L5-S1  . REPLACEMENT TOTAL KNEE Left 2003  . THORACIC AORTIC ANEURYSM REPAIR  2010    Home Medications:  Allergies as of 02/22/2019      Reactions   Penicillins Hives, Swelling, Other (See Comments)   SWELLING REACTION UNSPECIFIED PATIENT HAS TAKEN AMOXICILLIN ON MED HX FROM [facility] PATIENT HAS HAD A PCN REACTION WITH IMMEDIATE RASH, FACIAL/TONGUE/THROAT SWELLING, SOB, OR LIGHTHEADEDNESS WITH HYPOTENSION:  #  #  #  YES  #  #  #   Has patient had a PCN reaction causing severe rash involving mucus membranes or skin necrosis: No Has patient had a PCN reaction that required hospitalization No Has patient had a PCN reaction occurring within the last 10 years: No   Demerol [meperidine] Hives, Nausea And Vomiting      Medication List       Accurate as of February 22, 2019 11:59 PM. If you have any questions, ask your nurse or doctor.        amLODipine 10 MG tablet Commonly  known as: NORVASC Take 1 tablet (10 mg total) by mouth daily.   aspirin 325 MG EC tablet Take 325 mg by mouth every evening.   atorvastatin 40 MG tablet Commonly known as: LIPITOR TAKE ONE TABLET BY MOUTH EVERY DAY What changed: when to take this   CALCARB 600 PO Take 600 mg by mouth daily.   cetirizine 10 MG tablet Commonly known as: ZYRTEC Take 10 mg by mouth daily.   cholecalciferol 25 MCG (1000 UT) tablet Commonly known as: VITAMIN D3 Take 1,000 Units by mouth daily.   fenofibrate 160 MG tablet Take 1 tablet (160 mg total) by mouth at bedtime.   FLUoxetine 20 MG capsule Commonly known as: PROZAC Take 1 capsule (20 mg total) by mouth daily.   HYDROcodone-acetaminophen 5-325 MG tablet Commonly known as: Norco Take 1 tablet by mouth every 4 (four) hours as needed for moderate pain.   hydrocortisone cream 1 % Apply 1  application topically daily as needed for itching.   hydroxychloroquine 200 MG tablet Commonly known as: PLAQUENIL Take 200 mg by mouth every other day.   ibandronate 150 MG tablet Commonly known as: BONIVA Take 1 tablet (150 mg total) by mouth every 30 (thirty) days. Take with a glass of water, on an empty stomach.  Stay upright for 30 min.   lisinopril 20 MG tablet Commonly known as: ZESTRIL Take 1 tablet (20 mg total) by mouth every evening.   Omeprazole 20 MG Tbec Take 1 tablet (20 mg total) by mouth daily.   testosterone cypionate 200 MG/ML injection Commonly known as: DEPOTESTOSTERONE CYPIONATE INJECT 1MLS INTO THE MUSCLE EVERY 14 DAYS   traZODone 50 MG tablet Commonly known as: DESYREL Take 1 tablet (50 mg total) by mouth at bedtime. May take 2 tablets at bedtime if needed.   vitamin B-12 1000 MCG tablet Commonly known as: CYANOCOBALAMIN Take 1,000 mcg by mouth daily.       Allergies:  Allergies  Allergen Reactions  . Penicillins Hives, Swelling and Other (See Comments)    SWELLING REACTION UNSPECIFIED PATIENT HAS TAKEN  AMOXICILLIN ON MED HX FROM [facility] PATIENT HAS HAD A PCN REACTION WITH IMMEDIATE RASH, FACIAL/TONGUE/THROAT SWELLING, SOB, OR LIGHTHEADEDNESS WITH HYPOTENSION:  #  #  #  YES  #  #  #   Has patient had a PCN reaction causing severe rash involving mucus membranes or skin necrosis: No Has patient had a PCN reaction that required hospitalization No Has patient had a PCN reaction occurring within the last 10 years: No  . Demerol [Meperidine] Hives and Nausea And Vomiting    Family History: Family History  Problem Relation Age of Onset  . Heart failure Mother   . Hypertension Mother   . Asthma Mother   . Osteoporosis Mother   . Heart attack Father 66       MI  . Hypertension Sister   . Prostate cancer Neg Hx   . Bladder Cancer Neg Hx   . Kidney cancer Neg Hx   . Colon cancer Neg Hx     Social History:  reports that he quit smoking about 37 years ago. His smoking use included cigarettes. He has a 32.00 pack-year smoking history. He has never used smokeless tobacco. He reports current alcohol use. He reports that he does not use drugs.  ROS: UROLOGY Frequent Urination?: Yes Hard to postpone urination?: Yes Burning/pain with urination?: No Get up at night to urinate?: Yes Leakage of urine?: No Urine stream starts and stops?: Yes Trouble starting stream?: No Do you have to strain to urinate?: No Blood in urine?: No Urinary tract infection?: No Sexually transmitted disease?: No Injury to kidneys or bladder?: No Painful intercourse?: No Weak stream?: No Erection problems?: No Penile pain?: No  Gastrointestinal Nausea?: No Vomiting?: No Indigestion/heartburn?: No Diarrhea?: No Constipation?: No  Constitutional Fever: No Night sweats?: No Weight loss?: Yes Fatigue?: Yes  Skin Skin rash/lesions?: No Itching?: No  Eyes Blurred vision?: No Double vision?: No  Ears/Nose/Throat Sore throat?: No Sinus problems?: No  Hematologic/Lymphatic Swollen glands?: No Easy  bruising?: No  Cardiovascular Leg swelling?: No Chest pain?: No  Respiratory Cough?: No Shortness of breath?: No  Endocrine Excessive thirst?: No  Musculoskeletal Back pain?: No Joint pain?: Yes  Neurological Headaches?: No Dizziness?: No  Psychologic Depression?: No Anxiety?: No  Physical Exam: BP (!) 92/58 (BP Location: Left Arm, Patient Position: Sitting, Cuff Size: Normal)   Pulse 67   Ht  6' (1.829 m)   Wt 163 lb 12.8 oz (74.3 kg)   BMI 22.22 kg/m   Constitutional:  Well nourished. Alert and oriented, No acute distress. HEENT: [place] AT, moist mucus membranes.  Trachea midline, no masses. Cardiovascular: No clubbing, cyanosis, or edema. Respiratory: Normal respiratory effort, no increased work of breathing. Neurologic: Grossly intact, no focal deficits, moving all 4 extremities. Psychiatric: Normal mood and affect.  Laboratory Data: Lab Results  Component Value Date   WBC 4.0 12/19/2018   HGB 12.0 (L) 12/25/2018   HCT 36.9 (L) 12/25/2018   MCV 95.9 12/19/2018   PLT 128 (L) 12/19/2018    Lab Results  Component Value Date   CREATININE 1.29 (H) 10/27/2018   PSA history:  <0.1 ng/mL on 01/17/2014    0.2 ng/mL on 08/07/2014    0.3 ng/mL on 02/17/2015    0.2 ng/mL on 07/09/2015  <0.2 ng/mL on 01/03/2017  <0.01 ng/mL on 09/09/2016  <0.1 ng/mL on 01/03/2018  0.2 ng/mL on 04/12/2018  Lab Results  Component Value Date   PSA <0.01 09/09/2016   Lab Results  Component Value Date   TESTOSTERONE 1,449 (H) 02/22/2019   Lab Results  Component Value Date   TSH 0.892 12/19/2018   Lab Results  Component Value Date   AST 23 06/27/2017   Lab Results  Component Value Date   ALT 14 (L) 06/27/2017   Urinalysis    Component Value Date/Time   COLORURINE YELLOW 10/27/2018 [place] 10/27/2018 0644   APPEARANCEUR Clear 03/28/2014 1145   LABSPEC 1.023 10/27/2018 0644   LABSPEC 1.016 03/28/2014 1145   PHURINE 5.0 10/27/2018 [place] 10/27/2018 0644   GLUCOSEU Negative 03/28/2014 [place] 10/27/2018 [place] 10/27/2018 0644   BILIRUBINUR Small 11/21/2015 1620   BILIRUBINUR Negative 03/28/2014 1145   KETONESUR NEGATIVE 10/27/2018 0644   PROTEINUR 100 (A) 10/27/2018 0644   UROBILINOGEN 0.2 11/21/2015 1620   NITRITE NEGATIVE 10/27/2018 0644   LEUKOCYTESUR NEGATIVE 10/27/2018 0644   LEUKOCYTESUR Negative 03/28/2014 1145   I have reviewed the labs.  Assessment & Plan:    1. Urge incontinence  Patient has discontinued Myrbetriq having found it ineffective Explained to the patient that an alternate medication is oxybutynin; explained how it works and its side effects.  Patient declined to pursue. Patient has researched the PTNS therapy and would like to pursue treatment at this time Schedule PTNS  3. Testosterone deficiency Patient will discontinue this testosterone therapy for one month and will recheck his levels in one month   Return for schedule PTNS .  Zara Council, PA-C  [facility] [address] [place], [place] [address] [phone]

## 2019-02-23 ENCOUNTER — Telehealth: Payer: Self-pay | Admitting: Family Medicine

## 2019-02-23 LAB — TESTOSTERONE: Testosterone: 1449 ng/dL — ABNORMAL HIGH (ref 264–916)

## 2019-02-23 NOTE — Telephone Encounter (Signed)
-----   Message from Nori Riis, PA-C sent at 02/23/2019  7:58 AM EDT ----- Please instruct Mr. Juby to stop his testosterone injections.  I also would like [facility] notified that he is not to receive anymore testosterone injections.  His testosterone level is elevated.

## 2019-02-23 NOTE — Telephone Encounter (Signed)
Patient notified and [facility] pharmacy was closed. Will call again

## 2019-02-26 DIAGNOSIS — M19071 Primary osteoarthritis, right ankle and foot: Secondary | ICD-10-CM | POA: Diagnosis not present

## 2019-02-26 DIAGNOSIS — M205X1 Other deformities of toe(s) (acquired), right foot: Secondary | ICD-10-CM | POA: Diagnosis not present

## 2019-02-26 NOTE — Telephone Encounter (Signed)
[facility] notified to cancel testosterone.

## 2019-03-01 NOTE — Telephone Encounter (Signed)
Left a message on pharmacy voicemail notifying them

## 2019-03-01 NOTE — Telephone Encounter (Signed)
Will you try to call [facility] again and inform them that he is not getting testosterone injections at this time?

## 2019-03-02 DIAGNOSIS — S22070A Wedge compression fracture of T9-T10 vertebra, initial encounter for closed fracture: Secondary | ICD-10-CM | POA: Diagnosis not present

## 2019-03-04 NOTE — Progress Notes (Signed)
Date:  03/04/2019   ID:  Bryan [name], DOB Nov 07, 1940, MRN [id number]  Patient Location:  [place] [id number]   Provider location:   [facility], [place] office  PCP:  Bryan Haven, MD  Cardiologist:  Bryan [name]  Chief Complaint:  Back pain   History of Present Illness:    Bryan [name] is a 78 y.o. 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 The left ventricle has normal systolic function, with an ejection fraction of 55-60%. The cavity size was normal. Left ventricular diastolic Doppler parameters are indeterminate.  2. The right ventricle has normal systolic function. The cavity was mildly enlarged. There is Right vetricular wall thickness was not assessed. Right ventricular systolic pressure is mildly elevated with an estimated pressure of 37.5 mmHg.  3. Left atrial size was severely dilated.  4. Right atrial size was normal.  5. The pulmonic valve was not well visualized. Pulmonic valve regurgitation is trivial by color flow Doppler.  6. The aorta is abnormal unless otherwise noted.  7. There is mild dilatation of the aortic root measuring 39 mm.  Lost his wife , kidney failure Weight down 20 pounds this year  Night sweats  Have stopped Etiology unclear  Still with back pain Denies any shortness of breath or  chest pain No new leg edema Feels fine except for his back Did not appreciate he was in atrial fibrillation on today's visit  EKG personally reviewed by myself on todays visit Shows atrial fibrillation ventricular rate 73 bpm left axis deviation no significant ST-T wave changes  Several back surgeries Back surgery  5/18 chroni back and leg pain, Fx of L4, Cement  Fusion  Lab work reviewed with him HCT 32, down from 36 Seen by hemaology, workup negative, started on iron, constipation Was given iron infusion  Prior CV studies:   The following studies were reviewed today:  Echo 11/2015: Left ventricle:  The estimated ejection fraction was in therange of 50% to 55%.  - Aortic valve: A bioprosthesis was present. There was mildstenosis. Mean gradient (S): 20 mm Hg. Valve area (VTI): 1.24 cm^2. - Aortic root: The aortic root was mild to moderately dilated.44 mm (ED). Ascending aorta mildly dilated - Pulmonary arteries:moderately increased.PA peak pressure: 53 mm Hg (S).  Echocardiogram June 2016 discussed with him showing normal LV function, valve gradient consistent with bioprosthetic valve, moderately dilated ascending aorta, stable, 4.4 cm  Echocardiogram July 2015 showing mild to moderate aortic valve stenosis, 4.5 cm ascending aorta. No prior records available  Notes indicate some degree of carotid disease though details are not available.   Past Medical History:  Diagnosis Date   Abnormal CT scan    Asymmetric left rectal wall thickening    Anemia    Anxiety    Arrhythmia    Chronic lower back pain    Complication of anesthesia  Memory loss 09/2015   Coronary artery disease    Depression    GERD (gastroesophageal reflux disease)    Glaucoma    H/O thoracic aortic aneurysm repair    Heart murmur    History of being hospitalized    memory lose kidney funtion down blood pressure up   History of blood transfusion    "think he had one when he had  heart valve OR" (10/14/2016); "none since" (10/19/2017)   History of chicken pox    Hyperlipidemia    Hypertension    Lumbar stenosis    Murmur    Neuropathy    Osteoarthritis    worse in feet and ankles   Paroxysmal A-fib ([place])    Poor short term memory    takes Aricept   Rheumatoid arthritis ([place])    "all over" (10/14/2016)   Schizophrenia ([place])    Seasonal allergies    Valvular heart disease    Past Surgical History:  Procedure Laterality Date   AORTIC VALVE REPLACEMENT  2007   [facility].  [place], [place]; "pig valve"   APPENDECTOMY  2010   BACK SURGERY     CARDIAC VALVE REPLACEMENT     CATARACT EXTRACTION W/ INTRAOCULAR LENS  IMPLANT, BILATERAL Bilateral 2012   COLONOSCOPY WITH PROPOFOL N/A 09/24/2016   Procedure: COLONOSCOPY WITH PROPOFOL;  Surgeon: Jonathon Bellows, MD;  Location: [facility] ENDOSCOPY;  Service: Endoscopy;  Laterality: N/A;   ESOPHAGOGASTRODUODENOSCOPY (EGD) WITH PROPOFOL N/A 09/24/2016   Procedure: ESOPHAGOGASTRODUODENOSCOPY (EGD) WITH PROPOFOL;  Surgeon: Jonathon Bellows, MD;  Location: [facility] ENDOSCOPY;  Service: Endoscopy;  Laterality: N/A;   GIVENS CAPSULE STUDY N/A 09/24/2016   Procedure: GIVENS CAPSULE STUDY;  Surgeon: Jonathon Bellows, MD;  Location: [facility] ENDOSCOPY;  Service: Endoscopy;  Laterality: N/A;   HAMMER TOE SURGERY Right 10/09/2015   Procedure: HAMMER TOE REPAIR WITH K-WIRE FIXATION RIGHT SECOND TOE;  Surgeon: Albertine Patricia, DPM;  Location: [place];  Service: Podiatry;  Laterality: Right;  WITH LOCAL   INGUINAL HERNIA REPAIR Right 05/05/2018   Medium Bard PerFix plug.  Surgeon: Robert Bellow, MD;  Location: [facility] ORS;  Service: General;  Laterality: Right;   IR VERTEBROPLASTY CERV/THOR BX INC UNI/BIL INC/INJECT/IMAGING  10/27/2018   JOINT REPLACEMENT Left    left total knee   LAMINECTOMY WITH POSTERIOR LATERAL ARTHRODESIS LEVEL 3 N/A 09/28/2017   Procedure: LUMBAR  POSTEROR  FUSION REVISION - LUMBAR ONE -TWO, LUMBAR TWO  -THREE,THREE-FOUR ,BILATERAL ARTHRODESIS REMOVAL LUMBAR THREE HARDWARE;  Surgeon: Kary Kos, MD;  Location: [place];  Service: Neurosurgery;  Laterality: N/A;   LAMINECTOMY WITH POSTERIOR LATERAL ARTHRODESIS LEVEL 3 N/A 10/20/2017   Procedure: Revision fusion and removal of hardware Lumbar one, Pedicle screw fixation thoracic ten-lumbar two, thoracic nine-ten laminotomy, Posterior Lumbar Arthrodesis thoracic ten-lumbar two ;  Surgeon: Kary Kos, MD;  Location: [place];  Service: Neurosurgery;  Laterality: N/A;   LAPAROSCOPIC CHOLECYSTECTOMY  2010   LUMBAR FUSION Right 06/08/2017   LUMBAR THREE-FOUR, LUMBAR FOUR-FIVE POSTEROLATERAL ARTHRODESIS WITH RIGHT LUMBAR FOUR-FIVE LAMINECTOMY/FORAMINOTOMY   LUMBAR LAMINECTOMY/DECOMPRESSION MICRODISCECTOMY Right 03/08/2016   Procedure: Laminectomy and Foraminotomy - Lumbar Five-Sacral One Right;  Surgeon: Kary Kos, MD;  Location: [place];  Service: Neurosurgery;  Laterality: Right;  Right   MULTIPLE TOOTH EXTRACTIONS     "3"   POSTERIOR LUMBAR FUSION  10/14/2016   L5-S1   REPLACEMENT TOTAL KNEE Left 2003   THORACIC AORTIC ANEURYSM REPAIR  2010     No outpatient medications have been marked as  taking for the 03/05/19 encounter (Appointment) with Minna Merritts, MD.     Allergies:   Penicillins and Demerol [meperidine]   Social History   Tobacco Use   Smoking status: Former Smoker    Packs/day: 1.00    Years: 32.00    Pack years: 32.00    Types: Cigarettes    Quit date: 07/05/1981    Years since quitting: 37.6   Smokeless tobacco: Never Used  Substance Use Topics   Alcohol use: Yes    Comment: 10/19/2017 "glass of wine/month; if that"   Drug use: Never     Current Outpatient Medications on File Prior to Visit  Medication Sig Dispense Refill   amLODipine (NORVASC) 10 MG tablet Take 1 tablet (10 mg total) by mouth daily. 180 tablet 3   aspirin 325 MG EC tablet Take 325 mg by mouth every evening.      atorvastatin (LIPITOR) 40 MG  tablet TAKE ONE TABLET BY MOUTH EVERY DAY (Patient taking differently: Take 40 mg by mouth at bedtime. ) 90 tablet 3   Calcium Carbonate (CALCARB 600 PO) Take 600 mg by mouth daily.     cetirizine (ZYRTEC) 10 MG tablet Take 10 mg by mouth daily.     cholecalciferol (VITAMIN D3) 25 MCG (1000 UT) tablet Take 1,000 Units by mouth daily.     fenofibrate 160 MG tablet Take 1 tablet (160 mg total) by mouth at bedtime. 90 tablet 3   FLUoxetine (PROZAC) 20 MG capsule Take 1 capsule (20 mg total) by mouth daily. 90 capsule 1   HYDROcodone-acetaminophen (NORCO) 5-325 MG tablet Take 1 tablet by mouth every 4 (four) hours as needed for moderate pain. 15 tablet 0   hydrocortisone cream 1 % Apply 1 application topically daily as needed for itching.     hydroxychloroquine (PLAQUENIL) 200 MG tablet Take 200 mg by mouth every other day.      ibandronate (BONIVA) 150 MG tablet Take 1 tablet (150 mg total) by mouth every 30 (thirty) days. Take with a glass of water, on an empty stomach.  Stay upright for 30 min. 3 tablet 3   lisinopril (PRINIVIL,ZESTRIL) 20 MG tablet Take 1 tablet (20 mg total) by mouth every evening. 90 tablet 3   Omeprazole 20 MG TBEC Take 1 tablet (20 mg total) by mouth daily. 30 each 11   testosterone cypionate (DEPOTESTOSTERONE CYPIONATE) 200 MG/ML injection INJECT 1MLS INTO THE MUSCLE EVERY 14 DAYS 10 mL 0   traZODone (DESYREL) 50 MG tablet Take 1 tablet (50 mg total) by mouth at bedtime. May take 2 tablets at bedtime if needed. 90 tablet 1   vitamin B-12 (CYANOCOBALAMIN) 1000 MCG tablet Take 1,000 mcg by mouth daily.     No current facility-administered medications on file prior to visit.      Family Hx: The patient's family history includes Asthma in his mother; Heart attack (age of onset: 60) in his father; Heart failure in his mother; Hypertension in his mother and sister; Osteoporosis in his mother. There is no history of Prostate cancer, Bladder Cancer, Kidney cancer, or  Colon cancer.  ROS:   Please see the history of present illness.    Review of Systems  Constitutional: Negative.   HENT: Negative.   Respiratory: Negative.   Cardiovascular: Negative.   Gastrointestinal: Negative.   Musculoskeletal: Negative.   Neurological: Negative.   Psychiatric/Behavioral: Negative.   All other systems reviewed and are negative.    Labs/Other Tests and Data Reviewed:  Recent Labs: 10/27/2018: BUN 26; Creatinine, Ser 1.29; Potassium 4.1; Sodium 138 12/19/2018: Platelets 128; TSH 0.892 12/25/2018: Hemoglobin 12.0   Recent Lipid Panel Lab Results  Component Value Date/Time   CHOL 112 12/16/2016 10:06 AM   TRIG 90.0 12/16/2016 10:06 AM   HDL 49.40 12/16/2016 10:06 AM   CHOLHDL 2 12/16/2016 10:06 AM   LDLCALC 45 12/16/2016 10:06 AM    Wt Readings from Last 3 Encounters:  02/22/19 163 lb 12.8 oz (74.3 kg)  01/26/19 167 lb 6.4 oz (75.9 kg)  01/24/19 167 lb (75.8 kg)     Exam:    BP 100/60 (BP Location: Left Arm, Patient Position: Sitting, Cuff Size: Normal)    Pulse 73    Temp (!) 97.3 F (36.3 C)    Ht 6' (1.829 m)    Wt 166 lb 8 oz (75.5 kg)    BMI 22.58 kg/m  Constitutional:  oriented to person, place, and time. No distress.  HENT:  Head: Grossly normal Eyes:  no discharge. No scleral icterus.  Neck: No JVD, no carotid bruits  Cardiovascular: Irregularly irregular,  no murmurs appreciated Pulmonary/Chest: Clear to auscultation bilaterally, no wheezes or rails Abdominal: Soft.  no distension.  no tenderness.  Musculoskeletal: Normal range of motion Neurological:  normal muscle tone. Coordination normal. No atrophy Skin: Skin warm and dry Psychiatric: normal affect, pleasant  ASSESSMENT & PLAN:    Paroxysmal atrial fibrillation (HCC) -  Remote history of postoperative atrial fibrillation in 2010 Now again in atrial fibrillation today, asymptomatic He did not realize his rhythm had changed He has severely dilated left atrium on  echocardiogram We have recommended he start Eliquis 5 mg twice daily Decrease aspirin down to 81 mg daily  Essential hypertension - Plan: EKG 12-Lead Blood pressure is low recommended he decrease dose down to 5 mg daily He has had dramatic weight loss over the past year Suggested he monitor blood pressure at home as further medication changes may be needed  CAD in native artery - Plan: EKG 12-Lead Currently with no symptoms of angina. No further workup at this time. Continue current medication regimen. Stable  Aneurysm of ascending aorta, s/post repair/grafting Echocardiogram with no significant change in aorta size 3.9 cm  Mixed hyperlipidemia Numbers at goal, no medication changes made Recent weight loss  S/P AVR (aortic valve replacement) Aortic valve stable, mild stenosis noted Stable aortic valve  Cognitive decline Reports his symptoms are stable Appropriate on today's visit  Bradycardia Heart rate in the 70s, rhythm change now atrial fibrillation   Long discussion with him concerning new rhythm, implications, options, medication changes made as above  Total encounter time more than 45 minutes  Greater than 50% was spent in counseling and coordination of care with the patient    Medication Adjustments/Labs and Tests Ordered: Current medicines are reviewed at length with the patient today.  Concerns regarding medicines are outlined above.   Tests Ordered: No tests ordered   Medication Changes: Start Eliquis, decrease amlodipine   Disposition: Follow-up in 6 months   Signed, Ida Rogue, MD  03/04/2019 9:35 AM    [place] [facility] [address] [place] [address], [place], [place] [address]

## 2019-03-05 ENCOUNTER — Encounter: Payer: Self-pay | Admitting: Cardiovascular Disease

## 2019-03-05 ENCOUNTER — Ambulatory Visit (INDEPENDENT_AMBULATORY_CARE_PROVIDER_SITE_OTHER): Payer: Medicare Other | Admitting: Cardiovascular Disease

## 2019-03-05 ENCOUNTER — Other Ambulatory Visit: Payer: Self-pay

## 2019-03-05 VITALS — BP 100/60 | HR 73 | Temp 97.3°F | Ht 72.0 in | Wt 166.5 lb

## 2019-03-05 DIAGNOSIS — I712 Thoracic aortic aneurysm, without rupture: Secondary | ICD-10-CM

## 2019-03-05 DIAGNOSIS — I10 Essential (primary) hypertension: Secondary | ICD-10-CM | POA: Diagnosis not present

## 2019-03-05 DIAGNOSIS — I480 Paroxysmal atrial fibrillation: Secondary | ICD-10-CM

## 2019-03-05 DIAGNOSIS — Z952 Presence of prosthetic heart valve: Secondary | ICD-10-CM

## 2019-03-05 DIAGNOSIS — I7121 Aneurysm of the ascending aorta, without rupture: Secondary | ICD-10-CM

## 2019-03-05 DIAGNOSIS — R4189 Other symptoms and signs involving cognitive functions and awareness: Secondary | ICD-10-CM

## 2019-03-05 DIAGNOSIS — I6523 Occlusion and stenosis of bilateral carotid arteries: Secondary | ICD-10-CM

## 2019-03-05 DIAGNOSIS — N179 Acute kidney failure, unspecified: Secondary | ICD-10-CM | POA: Diagnosis not present

## 2019-03-05 MED ORDER — ASPIRIN EC 81 MG PO TBEC: 81.0000 mg | DELAYED_RELEASE_TABLET | Freq: Every day | ORAL | Status: DC

## 2019-03-05 MED ORDER — AMLODIPINE BESYLATE 5 MG PO TABS: ORAL_TABLET | ORAL | 3 refills | Status: DC

## 2019-03-05 MED ORDER — APIXABAN 5 MG PO TABS: 5.0000 mg | ORAL_TABLET | Freq: Two times a day (BID) | ORAL | 11 refills | Status: DC

## 2019-03-05 NOTE — Patient Instructions (Addendum)
Medication Instructions:  - Your physician has recommended you make the following change in your medication:   1) Decrease aspirin to 81 mg- take 1 tablet by mouth once daily  2) Start eliquis 5 mg- take 1 tablet by mouth twice a day  3) Decrease amlodipine to 5 mg - take 1 tablet by mouth once daily   Samples given: Eliquis 5 mg Lot: [id number] Exp: 12/22 # 3 boxes   If you need a refill on your cardiac medications before your next appointment, please call your pharmacy.    Lab work: No new labs needed   If you have labs (blood work) drawn today and your tests are completely normal, you will receive your results only by: Marland Kitchen MyChart Message (if you have MyChart) OR . A paper copy in the mail If you have any lab test that is abnormal or we need to change your treatment, we will call you to review the results.   Testing/Procedures: No new testing needed   Follow-Up: At [facility] you and your health needs are our priority.  As part of our continuing mission to provide you with exceptional heart care, we have created designated Provider Care Teams.  These Care Teams include your primary Cardiologist (physician) and Advanced Practice Providers (APPs -  Physician Assistants and Nurse Practitioners) who all work together to provide you with the care you need, when you need it.  . You will need a follow up appointment in 6 months (April 2021)  .   Please call our office 2 months in advance to schedule this appointment.  (Call in early February 2021 to schedule)  . Providers on your designated Care Team:   . Murray Hodgkins, NP . Christell Faith, PA-C . Marrianne Mood, PA-C  Any Other Special Instructions Will Be Listed Below (If Applicable).  For educational health videos Log in to : [email or web address] Or : [email or web address], password : [email or web address]

## 2019-03-06 ENCOUNTER — Other Ambulatory Visit: Payer: Self-pay | Admitting: Neurosurgery

## 2019-03-06 DIAGNOSIS — S22070A Wedge compression fracture of T9-T10 vertebra, initial encounter for closed fracture: Secondary | ICD-10-CM

## 2019-03-09 ENCOUNTER — Ambulatory Visit
Admission: RE | Admit: 2019-03-09 | Discharge: 2019-03-09 | Disposition: A | Discharge status: Home / Self Care | Payer: Medicare Other | Source: Ambulatory Visit | Attending: Neurosurgery | Admitting: Neurosurgery

## 2019-03-09 DIAGNOSIS — R0781 Pleurodynia: Secondary | ICD-10-CM | POA: Diagnosis not present

## 2019-03-09 DIAGNOSIS — S22070A Wedge compression fracture of T9-T10 vertebra, initial encounter for closed fracture: Secondary | ICD-10-CM

## 2019-03-09 MED ORDER — METHYLPREDNISOLONE ACETATE 40 MG/ML INJ SUSP (RADIOLOG
120.0000 mg | Freq: Once | INTRAMUSCULAR | Status: AC
  Administered 2019-03-09: 120 mg via INTRAMUSCULAR

## 2019-03-12 ENCOUNTER — Other Ambulatory Visit: Payer: Self-pay | Admitting: Family Medicine

## 2019-03-12 DIAGNOSIS — G4700 Insomnia, unspecified: Secondary | ICD-10-CM

## 2019-03-14 ENCOUNTER — Other Ambulatory Visit: Payer: Medicare Other

## 2019-03-20 ENCOUNTER — Ambulatory Visit: Payer: Medicare Other | Admitting: Endocrinology

## 2019-03-22 ENCOUNTER — Inpatient Hospital Stay: Payer: Medicare Other | Attending: Hematology and Oncology

## 2019-03-29 NOTE — Progress Notes (Signed)
PTNS  Session # 1 of 12  Health & Social Factors:  Caffeine: 4 Alcohol: 0 Daytime voids #per day: 10 Night-time voids #per night: 4 Urgency: strong Incontinence Episodes #per day: 0 Ankle used: right Treatment Setting: 2 Feeling/ Response: Both Comments: Patient tolerated procedure well   Performed By: Zara Council, PA-C

## 2019-03-30 ENCOUNTER — Ambulatory Visit (INDEPENDENT_AMBULATORY_CARE_PROVIDER_SITE_OTHER): Payer: Medicare Other | Admitting: Urology

## 2019-03-30 ENCOUNTER — Other Ambulatory Visit: Payer: Self-pay

## 2019-03-30 ENCOUNTER — Encounter: Payer: Self-pay | Admitting: Urology

## 2019-03-30 VITALS — BP 118/71 | HR 69 | Ht 70.0 in | Wt 165.0 lb

## 2019-03-30 DIAGNOSIS — R350 Frequency of micturition: Secondary | ICD-10-CM

## 2019-03-31 LAB — TESTOSTERONE: Testosterone: <3 ng/dL — ABNORMAL LOW (ref 264–916)

## 2019-04-06 ENCOUNTER — Ambulatory Visit (INDEPENDENT_AMBULATORY_CARE_PROVIDER_SITE_OTHER): Payer: Medicare Other | Admitting: Family Medicine

## 2019-04-06 ENCOUNTER — Other Ambulatory Visit: Payer: Self-pay

## 2019-04-06 DIAGNOSIS — R350 Frequency of micturition: Secondary | ICD-10-CM | POA: Diagnosis not present

## 2019-04-06 NOTE — Progress Notes (Signed)
PTNS  Session # 2  Health & Social Factors: no change Caffeine: 0 Alcohol: 0 Daytime voids #per day: 4 Night-time voids #per night: 4 Urgency: strong Incontinence Episodes #per day: 0 Ankle used: right Treatment Setting: 5 Feeling/ Response: sensory Comments: Patient tolerated well  Preformed By: Elberta Leatherwood, CMA  Follow Up: 1 week #3

## 2019-04-10 DIAGNOSIS — S32009K Unspecified fracture of unspecified lumbar vertebra, subsequent encounter for fracture with nonunion: Secondary | ICD-10-CM | POA: Diagnosis not present

## 2019-04-10 DIAGNOSIS — M546 Pain in thoracic spine: Secondary | ICD-10-CM | POA: Diagnosis not present

## 2019-04-12 ENCOUNTER — Other Ambulatory Visit: Payer: Self-pay | Admitting: Cardiovascular Disease

## 2019-04-12 MED ORDER — APIXABAN 5 MG PO TABS: 5.0000 mg | ORAL_TABLET | Freq: Two times a day (BID) | ORAL | 2 refills | Status: DC

## 2019-04-12 NOTE — Telephone Encounter (Signed)
Eliquis refill request.

## 2019-04-12 NOTE — Telephone Encounter (Signed)
°*  STAT* If patient is at the pharmacy, call can be transferred to refill team.   1. Which medications need to be refilled? (please list name of each medication and dose if known)   Eliquis 5 mg po BID  2. Which pharmacy/location (including street and city if local pharmacy) is medication to be sent to?  [facility] on file [facility] not honor [facility]  3. Do they need a 30 day or 90 day supply? [place]

## 2019-04-12 NOTE — Telephone Encounter (Signed)
Eliquis 5mg  refill request received. Pt would like refill to be sent to [facility] in [place] . Pt is 78yrs old, weight-74.8kg, Crea-1.29 on 10/27/2018, Diagnosis-Afib, and last seen by Dr. [name] on 03/05/2019. Dose is appropriate based on dosing criteria. Will send in refill to requested pharmacy.

## 2019-04-12 NOTE — Progress Notes (Signed)
PTNS  Session # 3  Health & Social Factors: No change Caffeine: 3 Alcohol: 0 Daytime voids #per day: 4 Night-time voids #per night: 3 Urgency: strong Incontinence Episodes #per day: 0 Ankle used: right Treatment Setting: 6 Feeling/ Response: sensory  Comments: tolerated procedure well   Performed By: Zara Council, PA-C

## 2019-04-13 ENCOUNTER — Other Ambulatory Visit: Payer: Self-pay

## 2019-04-13 ENCOUNTER — Telehealth: Payer: Self-pay | Admitting: Urology

## 2019-04-13 ENCOUNTER — Encounter: Payer: Self-pay | Admitting: Urology

## 2019-04-13 ENCOUNTER — Ambulatory Visit (INDEPENDENT_AMBULATORY_CARE_PROVIDER_SITE_OTHER): Payer: Medicare Other | Admitting: Urology

## 2019-04-13 DIAGNOSIS — R350 Frequency of micturition: Secondary | ICD-10-CM

## 2019-04-13 MED ORDER — TESTOSTERONE CYPIONATE 200 MG/ML IM SOLN: INTRAMUSCULAR | 0 refills | Status: DC

## 2019-04-13 NOTE — Telephone Encounter (Signed)
Pharmacy notified.

## 2019-04-13 NOTE — Telephone Encounter (Signed)
Would you call [facility] pharmacy and let them know that Bryan [name] new testosterone dose is 0.25 cc IM every 2 weeks?

## 2019-04-16 ENCOUNTER — Ambulatory Visit
Admission: RE | Admit: 2019-04-16 | Discharge: 2019-04-16 | Disposition: A | Discharge status: Home / Self Care | Payer: Medicare Other | Source: Ambulatory Visit | Attending: Endocrinology | Admitting: Endocrinology

## 2019-04-16 DIAGNOSIS — M810 Age-related osteoporosis without current pathological fracture: Secondary | ICD-10-CM | POA: Insufficient documentation

## 2019-04-17 DIAGNOSIS — M546 Pain in thoracic spine: Secondary | ICD-10-CM | POA: Diagnosis not present

## 2019-04-27 ENCOUNTER — Encounter: Payer: Self-pay | Admitting: Physician Assistant

## 2019-04-27 ENCOUNTER — Ambulatory Visit (INDEPENDENT_AMBULATORY_CARE_PROVIDER_SITE_OTHER): Payer: Medicare Other | Admitting: Physician Assistant

## 2019-04-27 ENCOUNTER — Other Ambulatory Visit: Payer: Self-pay

## 2019-04-27 VITALS — BP 157/68 | HR 78 | Ht 70.0 in | Wt 165.0 lb

## 2019-04-27 DIAGNOSIS — N3941 Urge incontinence: Secondary | ICD-10-CM

## 2019-04-27 NOTE — Progress Notes (Signed)
PTNS  Session # 4 of 12  Health & Social Factors: No change Caffeine: 3 Alcohol: 0 Daytime voids #per day: 4 Night-time voids #per night: 3 Urgency: Mild Incontinence Episodes #per day: 0 Ankle used: Right Treatment Setting: 7 Feeling/ Response: Sensory Comments: Tolerated well  Performed By: Verlene Mayer, CMA  Follow Up: One week

## 2019-05-04 ENCOUNTER — Other Ambulatory Visit: Payer: Self-pay

## 2019-05-04 ENCOUNTER — Encounter: Payer: Self-pay | Admitting: Physician Assistant

## 2019-05-04 ENCOUNTER — Ambulatory Visit (INDEPENDENT_AMBULATORY_CARE_PROVIDER_SITE_OTHER): Payer: Medicare Other | Admitting: Physician Assistant

## 2019-05-04 VITALS — BP 134/74 | HR 80 | Ht 72.0 in | Wt 165.0 lb

## 2019-05-04 DIAGNOSIS — N3941 Urge incontinence: Secondary | ICD-10-CM | POA: Diagnosis not present

## 2019-05-04 NOTE — Progress Notes (Signed)
PTNS  Session # 5 of 12  Health & Social Factors: no change Caffeine: 3 Alcohol: 0 Daytime voids #per day: 6 Night-time voids #per night: 4 Urgency: strong Incontinence Episodes #per day: 1 Ankle used: right Treatment Setting: 11 Feeling/ Response: sensory Comments: patient tolerated well  Performed By: Debroah Loop, PA-C   Follow Up: 1 week for PTNS #6

## 2019-05-11 ENCOUNTER — Other Ambulatory Visit: Payer: Self-pay

## 2019-05-11 ENCOUNTER — Encounter: Payer: Self-pay | Admitting: Physician Assistant

## 2019-05-11 ENCOUNTER — Ambulatory Visit (INDEPENDENT_AMBULATORY_CARE_PROVIDER_SITE_OTHER): Payer: Medicare Other | Admitting: Physician Assistant

## 2019-05-11 DIAGNOSIS — R350 Frequency of micturition: Secondary | ICD-10-CM | POA: Diagnosis not present

## 2019-05-11 NOTE — Progress Notes (Signed)
PTNS  Session # 6  Health & Social Factors: no change Caffeine: 2 Alcohol: 0 Daytime voids #per day: 4 Night-time voids #per night: 3 Urgency: strong Incontinence Episodes #per day: 0 Ankle used: right Treatment Setting: 15 Feeling/ Response: sensory Comments: patient tolerated well  Performed By: Debroah Loop, PA-C   Follow Up: 2 weeks for PTNS #7

## 2019-05-24 ENCOUNTER — Ambulatory Visit: Payer: Medicare Other | Admitting: Physician Assistant

## 2019-06-01 ENCOUNTER — Encounter: Payer: Self-pay | Admitting: Physician Assistant

## 2019-06-01 ENCOUNTER — Ambulatory Visit: Payer: Medicare Other | Admitting: Physician Assistant

## 2019-06-08 ENCOUNTER — Other Ambulatory Visit: Payer: Self-pay

## 2019-06-08 ENCOUNTER — Ambulatory Visit (INDEPENDENT_AMBULATORY_CARE_PROVIDER_SITE_OTHER): Payer: Medicare Other | Admitting: Physician Assistant

## 2019-06-08 DIAGNOSIS — N3941 Urge incontinence: Secondary | ICD-10-CM | POA: Diagnosis not present

## 2019-06-08 DIAGNOSIS — R350 Frequency of micturition: Secondary | ICD-10-CM

## 2019-06-08 NOTE — Patient Instructions (Signed)
Tracking Your Bladder Symptoms    Patient Name:___________________________________________________   Sample: Day   Daytime Voids  Nighttime Voids Urgency for the Day(0-4) Number of Accidents Beverage Comments  Monday IIII II 2 I Water IIII Coffee  I      Week Starting:____________________________________   Day Daytime  Voids Nighttime  Voids Urgency for  The Day(0-4) Number of Accidents Beverages Comments                                                           This week my symptoms were:  O much better  O better O the same O worse   

## 2019-06-08 NOTE — Progress Notes (Signed)
PTNS  Session # 7  Health & Social Factors: same Caffeine: 2 Alcohol: 0 Daytime voids #per day: 4 Night-time voids #per night: 3-4 Urgency: strong Incontinence Episodes #per day: 0 Ankle used: right Treatment Setting: 2 Feeling/ Response: both Comments: patient tolerated well  Preformed By: Fonnie Jarvis, CMA   Follow Up: 1week

## 2019-06-15 ENCOUNTER — Other Ambulatory Visit: Payer: Self-pay

## 2019-06-15 ENCOUNTER — Encounter: Payer: Self-pay | Admitting: Physician Assistant

## 2019-06-15 ENCOUNTER — Ambulatory Visit (INDEPENDENT_AMBULATORY_CARE_PROVIDER_SITE_OTHER): Payer: Medicare Other | Admitting: Physician Assistant

## 2019-06-15 VITALS — Ht 72.0 in

## 2019-06-15 DIAGNOSIS — N3941 Urge incontinence: Secondary | ICD-10-CM | POA: Diagnosis not present

## 2019-06-15 NOTE — Patient Instructions (Signed)
Tracking Your Bladder Symptoms    Patient Name:___________________________________________________   Sample: Day   Daytime Voids  Nighttime Voids Urgency for the Day(0-4) Number of Accidents Beverage Comments  Monday IIII II 2 I Water IIII Coffee  I      Week Starting:____________________________________   Day Daytime  Voids Nighttime  Voids Urgency for  The Day(0-4) Number of Accidents Beverages Comments                                                           This week my symptoms were:  O much better  O better O the same O worse   

## 2019-06-15 NOTE — Progress Notes (Signed)
PTNS  Session # 8 of 12  Health & Social Factors: no change Caffeine: 2 Alcohol: 0 Daytime voids #per day: 5 Night-time voids #per night: 3-4 Urgency: mild Incontinence Episodes #per day: 1 Ankle used: right Treatment Setting: 19 Feeling/ Response: sensory Comments: patient tolerated well  Performed By: Debroah Loop, PA-C   Follow Up: 1 week for PTNS # 9

## 2019-06-18 NOTE — Progress Notes (Signed)
[facility]  [address], [address] [place], [place] [address] Phone: [phone]  Fax: [phone]   Clinic Day:  06/21/2019  Referring physician: [name], MD  Chief Complaint: Bryan [name] is a 79 y.o. male with arthritis, iron deficiency anemia, and mild pancytopenia who is seen for 6 month assessment   HPI:  The patient was last seen in the hematology clinic on 12/21/2018. At that time,  he was fatigued.  He was on hydroxychloroquine 3x/week.  Exam was stable.  Hematocrit was 37.4, hemoglobin 12.4, MCV 95.9, platelets 128,000, WBC 4000, and ANC 2800.  Ferritin was 90 with an iron saturation of 24% and a TIBC 462. Sed rate was 4.  TSH was normal.  Hematocrit was 36.9 and hemoglobin 12.0 on 12/25/2018.  Bone density on 04/16/2019 showed osteoporosis with a T-score of -3.0 of the right dual femur and a T-score of -3.0 in the right forearm radius.  During the interim, he has had back pain. His cardiologist put him on Eliquis due to atrial fibrillation. He hasn't had any testosterone recently. He starts back on a lower dose with 1/2 cc in the upcoming week.  Symptomatically, he is tearful and upset due to the 1 year anniversary of his wife's death on 07-03-2022. He is otherwise physically fine. Most days he has energy to do things; his back pain slows him down.    Past Medical History:  Diagnosis Date  . Abnormal CT scan    Asymmetric left rectal wall thickening   . Anemia   . Anxiety   . Arrhythmia   . Chronic lower back pain   . Complication of anesthesia    Memory loss 09/2015  . Coronary artery disease   . Depression   . GERD (gastroesophageal reflux disease)   . Glaucoma   . H/O thoracic aortic aneurysm repair   . Heart murmur   . History of being hospitalized    memory lose kidney funtion down blood pressure up  . History of blood transfusion    "think he had one when he had heart valve OR" (10/14/2016); "none since" (10/19/2017)    . History of chicken pox   . Hyperlipidemia   . Hypertension   . Lumbar stenosis   . Murmur   . Neuropathy   . Osteoarthritis    worse in feet and ankles  . Paroxysmal A-fib ([place])   . Poor short term memory    takes Aricept  . Rheumatoid arthritis ([place])    "all over" (10/14/2016)  . Schizophrenia ([place])   . Seasonal allergies   . Valvular heart disease     Past Surgical History:  Procedure Laterality Date  . AORTIC VALVE REPLACEMENT  2007   [facility].  [place], [place]; "pig valve"  . APPENDECTOMY  2010  . BACK SURGERY    . CARDIAC VALVE REPLACEMENT    . CATARACT EXTRACTION W/ INTRAOCULAR LENS  IMPLANT, BILATERAL Bilateral 2012  . COLONOSCOPY WITH PROPOFOL N/A 09/24/2016   Procedure: COLONOSCOPY WITH PROPOFOL;  Surgeon: Jonathon Bellows, MD;  Location: [facility] ENDOSCOPY;  Service: Endoscopy;  Laterality: N/A;  . ESOPHAGOGASTRODUODENOSCOPY (EGD) WITH PROPOFOL N/A 09/24/2016   Procedure: ESOPHAGOGASTRODUODENOSCOPY (EGD) WITH PROPOFOL;  Surgeon: Jonathon Bellows, MD;  Location: [facility] ENDOSCOPY;  Service: Endoscopy;  Laterality: N/A;  . GIVENS CAPSULE STUDY N/A 09/24/2016   Procedure: GIVENS CAPSULE STUDY;  Surgeon: Jonathon Bellows, MD;  Location: [facility] ENDOSCOPY;  Service: Endoscopy;  Laterality: N/A;  . HAMMER TOE  SURGERY Right 10/09/2015   Procedure: HAMMER TOE REPAIR WITH K-WIRE FIXATION RIGHT SECOND TOE;  Surgeon: Albertine Patricia, DPM;  Location: [place];  Service: Podiatry;  Laterality: Right;  WITH LOCAL  . INGUINAL HERNIA REPAIR Right 05/05/2018   Medium Bard PerFix plug.  Surgeon: Robert Bellow, MD;  Location: [facility] ORS;  Service: General;  Laterality: Right;  . IR VERTEBROPLASTY CERV/THOR BX INC UNI/BIL INC/INJECT/IMAGING  10/27/2018  . JOINT REPLACEMENT Left    left total knee  . LAMINECTOMY WITH POSTERIOR LATERAL ARTHRODESIS LEVEL 3 N/A 09/28/2017   Procedure: LUMBAR  POSTEROR  FUSION REVISION - LUMBAR ONE -TWO, LUMBAR TWO -THREE,THREE-FOUR ,BILATERAL ARTHRODESIS REMOVAL LUMBAR  THREE HARDWARE;  Surgeon: Kary Kos, MD;  Location: [place];  Service: Neurosurgery;  Laterality: N/A;  . LAMINECTOMY WITH POSTERIOR LATERAL ARTHRODESIS LEVEL 3 N/A 10/20/2017   Procedure: Revision fusion and removal of hardware Lumbar one, Pedicle screw fixation thoracic ten-lumbar two, thoracic nine-ten laminotomy, Posterior Lumbar Arthrodesis thoracic ten-lumbar two ;  Surgeon: Kary Kos, MD;  Location: [place];  Service: Neurosurgery;  Laterality: N/A;  . LAPAROSCOPIC CHOLECYSTECTOMY  2010  . LUMBAR FUSION Right 06/08/2017   LUMBAR THREE-FOUR, LUMBAR FOUR-FIVE POSTEROLATERAL ARTHRODESIS WITH RIGHT LUMBAR FOUR-FIVE LAMINECTOMY/FORAMINOTOMY  . LUMBAR LAMINECTOMY/DECOMPRESSION MICRODISCECTOMY Right 03/08/2016   Procedure: Laminectomy and Foraminotomy - Lumbar Five-Sacral One Right;  Surgeon: Kary Kos, MD;  Location: [place];  Service: Neurosurgery;  Laterality: Right;  Right  . MULTIPLE TOOTH EXTRACTIONS     "3"  . POSTERIOR LUMBAR FUSION  10/14/2016   L5-S1  . REPLACEMENT TOTAL KNEE Left 2003  . THORACIC AORTIC ANEURYSM REPAIR  2010    Family History  Problem Relation Age of Onset  . Heart failure Mother   . Hypertension Mother   . Asthma Mother   . Osteoporosis Mother   . Heart attack Father 36       MI  . Hypertension Sister   . Prostate cancer Neg Hx   . Bladder Cancer Neg Hx   . Kidney cancer Neg Hx   . Colon cancer Neg Hx     Social History:  reports that he quit smoking about 37 years ago. His smoking use included cigarettes. He has a 32.00 pack-year smoking history. He has never used smokeless tobacco. He reports current alcohol use. He reports that he does not use drugs. He smoked 1 pack a day x 12 years. He stopped smoking 30 years ago. He drinks a glass of wine occasionally. He is a Software engineer. [place] His wife passed away in July 22, 2018. The patient is alone today.  Allergies:  Allergies  Allergen Reactions  .  Penicillins Hives, Swelling and Other (See Comments)    SWELLING REACTION UNSPECIFIED PATIENT HAS TAKEN AMOXICILLIN ON MED HX FROM [facility] PATIENT HAS HAD A PCN REACTION WITH IMMEDIATE RASH, FACIAL/TONGUE/THROAT SWELLING, SOB, OR LIGHTHEADEDNESS WITH HYPOTENSION:  #  #  #  YES  #  #  #   Has patient had a PCN reaction causing severe rash involving mucus membranes or skin necrosis: No Has patient had a PCN reaction that required hospitalization No Has patient had a PCN reaction occurring within the last 10 years: No  . Demerol [Meperidine] Hives and Nausea And Vomiting    Current Medications: Current Outpatient Medications  Medication Sig Dispense Refill  . amLODipine (NORVASC) 5 MG tablet Take 1 tablet (5 mg) by mouth once daily 90 tablet 3  .  apixaban (ELIQUIS) 5 MG TABS tablet Take 1 tablet (5 mg total) by mouth 2 (two) times daily. 180 tablet 2  . aspirin EC 81 MG tablet Take 1 tablet (81 mg total) by mouth daily.    Marland Kitchen atorvastatin (LIPITOR) 40 MG tablet TAKE ONE TABLET BY MOUTH EVERY DAY (Patient taking differently: Take 40 mg by mouth at bedtime. ) 90 tablet 3  . Calcium Carbonate (CALCARB 600 PO) Take 600 mg by mouth daily.    . cetirizine (ZYRTEC) 10 MG tablet Take 10 mg by mouth daily.    . cholecalciferol (VITAMIN D3) 25 MCG (1000 UT) tablet Take 1,000 Units by mouth daily.    . fenofibrate 160 MG tablet Take 1 tablet (160 mg total) by mouth at bedtime. 90 tablet 3  . FLUoxetine (PROZAC) 20 MG capsule Take 1 capsule (20 mg total) by mouth daily. 90 capsule 1  . HYDROcodone-acetaminophen (NORCO) 10-325 MG tablet Take 1 tablet by mouth every 6 (six) hours as needed.    . hydrocortisone cream 1 % Apply 1 application topically daily as needed for itching.    . hydroxychloroquine (PLAQUENIL) 200 MG tablet Take 200 mg by mouth every other day.     . ibandronate (BONIVA) 150 MG tablet Take 1 tablet (150 mg total) by mouth every 30 (thirty) days. Take with a glass of water, on an empty  stomach.  Stay upright for 30 min. 3 tablet 3  . lisinopril (PRINIVIL,ZESTRIL) 20 MG tablet Take 1 tablet (20 mg total) by mouth every evening. 90 tablet 3  . Omeprazole 20 MG TBEC Take 1 tablet (20 mg total) by mouth daily. 30 each 11  . traZODone (DESYREL) 50 MG tablet TAKE 1 TABLET AT BEDTIME MAY TAKE 2 TABLETS AT BEDTIME IF NEEDED 90 tablet 1  . vitamin B-12 (CYANOCOBALAMIN) 1000 MCG tablet Take 1,000 mcg by mouth daily.    Marland Kitchen testosterone cypionate (DEPOTESTOSTERONE CYPIONATE) 200 MG/ML injection Inject 0.25 cc every 2 weeks (Patient not taking: Reported on 06/21/2019) 10 mL 0   No current facility-administered medications for this visit.    Review of Systems  Constitutional: Negative for chills, diaphoresis, fever, malaise/fatigue and weight loss (stable).       Doing "pretty good".  Energy level is "fair".  HENT: Negative.  Negative for congestion, ear pain, hearing loss, nosebleeds, sinus pain and sore throat.   Eyes: Negative.  Negative for blurred vision, double vision and photophobia.  Respiratory: Negative.  Negative for cough, hemoptysis, sputum production, shortness of breath and wheezing.   Cardiovascular: Negative.  Negative for chest pain, palpitations, orthopnea, leg swelling and PND.  Gastrointestinal: Negative.  Negative for abdominal pain, blood in stool, constipation, diarrhea, melena, nausea and vomiting.  Genitourinary: Negative.  Negative for dysuria, frequency, hematuria and urgency.  Musculoskeletal: Positive for back pain and joint pain (rheumatoid arthritis). Negative for myalgias.  Skin: Negative.  Negative for rash.  Neurological: Negative.  Negative for dizziness, tingling, sensory change and weakness.  Endo/Heme/Allergies: Negative.  Does not bruise/bleed easily.       Testosterone q 2 weeks.  Psychiatric/Behavioral: Positive for depression (lost his wife in 06/2018). Negative for memory loss and substance abuse. The patient is not nervous/anxious and does not  have insomnia.   All other systems reviewed and are negative.  Performance status (ECOG):  1  Vitals Blood pressure (!) 162/100, pulse 76, temperature 98.1 F (36.7 C), temperature source Tympanic, resp. rate 18, weight 158 lb 13.5 oz (72 kg), SpO2 99 %.  Physical Exam  Constitutional: He is oriented to person, place, and time. He appears well-developed and well-nourished. No distress.  He has a cane by his side.  HENT:  Head: Normocephalic and atraumatic.  Mouth/Throat: Oropharynx is clear and moist. No oropharyngeal exudate.  White hair and goatee.    Eyes: Pupils are equal, round, and reactive to light. Conjunctivae and EOM are normal. No scleral icterus.  Hazel eyes.  Neck: No JVD present.  Cardiovascular: Normal rate, regular rhythm and normal heart sounds.  No murmur heard. Pulmonary/Chest: Effort normal and breath sounds normal. No respiratory distress. He has no wheezes. He has no rales.  Abdominal: Soft. Bowel sounds are normal. He exhibits no distension and no mass. There is no abdominal tenderness. There is no rebound and no guarding.  Musculoskeletal:        General: No edema. Normal range of motion.     Cervical back: Normal range of motion and neck supple.     Comments: Ulnar deviation.  Lymphadenopathy:    He has no cervical adenopathy.    He has no axillary adenopathy.       Right: No supraclavicular adenopathy present.       Left: No supraclavicular adenopathy present.  Neurological: He is alert and oriented to person, place, and time.  Skin: Skin is dry. No rash noted. He is not diaphoretic. No erythema. No pallor.  Psychiatric: He has a normal mood and affect. His behavior is normal. Judgment and thought content normal.  Tearful at times secondary to his wife's death.  Nursing note and vitals reviewed.   Appointment on 06/20/2019  Component Date Value Ref Range Status  . WBC 06/20/2019 6.2  4.0 - 10.5 K/uL Final  . RBC 06/20/2019 4.08* 4.22 - 5.81 MIL/uL  Final  . Hemoglobin 06/20/2019 12.9* 13.0 - 17.0 g/dL Final  . HCT 06/20/2019 39.7  39.0 - 52.0 % Final  . MCV 06/20/2019 97.3  80.0 - 100.0 fL Final  . MCH 06/20/2019 31.6  26.0 - 34.0 pg Final  . MCHC 06/20/2019 32.5  30.0 - 36.0 g/dL Final  . RDW 06/20/2019 12.1  11.5 - 15.5 % Final  . Platelets 06/20/2019 144* 150 - 400 K/uL Final  . nRBC 06/20/2019 0.0  0.0 - 0.2 % Final  . Neutrophils Relative % 06/20/2019 79  % Final  . Neutro Abs 06/20/2019 4.8  1.7 - 7.7 K/uL Final  . Lymphocytes Relative 06/20/2019 14  % Final  . Lymphs Abs 06/20/2019 0.9  0.7 - 4.0 K/uL Final  . Monocytes Relative 06/20/2019 7  % Final  . Monocytes Absolute 06/20/2019 0.5  0.1 - 1.0 K/uL Final  . Eosinophils Relative 06/20/2019 0  % Final  . Eosinophils Absolute 06/20/2019 0.0  0.0 - 0.5 K/uL Final  . Basophils Relative 06/20/2019 0  % Final  . Basophils Absolute 06/20/2019 0.0  0.0 - 0.1 K/uL Final  . Immature Granulocytes 06/20/2019 0  % Final  . Abs Immature Granulocytes 06/20/2019 0.02  0.00 - 0.07 K/uL Final   Performed at [facility], [address] [place], [place] [address]  . Ferritin 06/20/2019 138  24 - 336 ng/mL Final   Performed at [facility], [address] [place], [place] [address]  . Iron 06/20/2019 83  45 - 182 ug/dL Final  . TIBC 06/20/2019 466* 250 - 450 ug/dL Final  . Saturation Ratios 06/20/2019 18  17.9 - 39.5 % Final  . UIBC 06/20/2019 383  ug/dL Final  Performed at [facility], [address] [place], [place] [address]  . Sed Rate 06/20/2019 4  0 - 20 mm/hr Final   Performed at [facility], [address] [place], [place] [address]    Assessment:  Bryan [name] is a 79 y.o. male withrheumatoid arthritisand progressive anemiaover the past 2 years. He has been on Plaquenil and hydralazinewhich can cause anemia. Plaquenil was discontinued on 08/26/2016. He denies any exposure to radiation or toxins. He denies any prior  history of hepatitis, prior transfusions or HIV risk factors. He denies any herbal products.  Diet is good. EGDon 09/24/2016 revealed patchy candidiasis in the entire esophagus. There were two non-bleeding angioectasias in the duodenum treated with argon plasma coagulation. Gastritis was biopsied. Pathology revealed mild chronic gastritis negative for H pylori, dysplasia and malignancy. Colonoscopyon 09/24/2016 revealed diverticulosis in the entire colon, one 3 mm polyp in the cecum, and non-bleeding internal hemorrhoids. Pathology from the cecal polyp revealed a tubular adenoma negative for high grade dysplasia and malignancy.   He denies any melena or hematochezia. He denies any hematuria.  CBC on 01/10/2018revealed a hematocrit of 29.5, hemoglobin 10.0, MCV 92.3, platelets 155,000, and WBC 4200. Stool was guaiac negative x 2 in 06/2016. Labs on 01/12/2018revealed a ferritin of 124, iron saturation 22% and TIBC of 370. B12 was 883 on 06/18/2014.  Work-up on 02/26/2018revealed a hematocrit of 31.7, hemoglobin 10.6, MCV 91, platelets 143,000, white count 3000 with an ANC of 1800. Absolute lymphocyte count was 700 (low). Creatinine was 1.57. LDHwas 269 (98-192). Normal labsincluded: uric acid, folate, [place], SPEP, and copper. Iron studiesincluded a saturation of 11% (low) and a TIBC of 454 (high) c/wiron deficiency anemia. Sed rate was 19. Retic was 0.9% (low). He is on oral ironwith vitamin C.  Chest, abdomen, and pelvic CTon 08/09/2016 revealed no acute findings or clear evidence of malignancy in the chest, abdomen or pelvis. There was asymmetric left rectal wall thickeningpossibly secondary to volume averaging with the prostate gland.  Bone marrow aspirate and biopsyon 11/04/2016 revealed a normocellular marrow (20%) with trilineage hematopoiesis and maturation. There was mild megakaryocytic atypia. Storage iron was present. There were rare ringed  sideroblasts. Cytogeneticsand FISHwere normal (43, XY).  He was admitted to [facility] 10/14/2016 - 10/18/2016 for spinal stenosis of the lumbar region. He underwent redo decompressive lumbar laminotomy L5-S1 with radical foraminotomy the L5 and S1 nerve root with complete medial facetectomies. He underwentback surgery(revision fusion removal hardware and pedicle screw fixation) on 10/20/2017.  He was admitted to [facility] 10/28/2017 - 10/31/2017 with symptomatic anemia. He received 2 units of PRBCs. Hemoglobin improved from 6.2 to 8.1.  Hospital work-up revealed the following normal labs:ferritin (65), B12 (401), TSH, SPEP, haptoglobin. Testosteronewas <3 (low). Iron saturation was 7% with a TIBC of 317. LDH was 205 (98-192). Retic was 2.6%. Creatinine was 1.37 (CrCl 47.3 ml/min).  Peripheral smear revealedleukopenia with normal WBC morphology. Normocytic anemia with polychromasia, anisocytosis and rouleaux formation.   Work-up on 11/08/2017 revealed a hematocrit of 27.2, hemoglobin 9.0, platelets 200,000, WBC 5800 with an [place] of 4100. Ferritin was 86. Iron saturation was 6% with a TIBC of 382. Sed rate was 56 (0-20). Cold agglutinins were negative. Reticulocyte count was 1.2%. SPEP revealed no monoclonal protein on 10/31/2017.  He received weekly Venoferx 2 (11/25/2017 - 12/02/2017) and x 2 (12/23/2017 - 12/30/2017).  Ferritinhas been followed: 106 on 06/24/2017, 65 on 10/30/2017, 86 on 11/08/2017, 124 on 12/12/2017, 106 on 12/15/2017, 95 on 01/20/2018, 133  on 03/10/2018, 90 on 12/19/2018, and 138 on 06/20/2019. Iron saturationwas 6% on 06/24/2017 and 6% on 01/20/2018. Sed rate was 56 on 11/08/2017 and 64 on 12/05/2017.  He has a neuropathic ulcerof the right great toe. He has treated with Septra then switched to doxycycline. He underwent back surgeryon 10/20/2017.  Symptomatically, he is doing fair.  He is on Eliquis for atrial fibrillation.     Plan: 1.   Review labs from 06/20/2019. 2.  Iron deficiency anemia Hematocrit 39.7.  Hemoglobin 12.9. MCV 97.3.             Ferritin 138. Iron saturation 18% with a TIBC of 466. No Venofer needed. 3. Thrombocytopenia Platelet count 144,000. He is on no new medications or herbal products.               He remains on Plaquenil.             Continue to monitor. 4.   RTC prn.  I discussed the assessment and treatment plan with the patient.  The patient was provided an opportunity to ask questions and all were answered.  The patient agreed with the plan and demonstrated an understanding of the instructions.  The patient was advised to call back if the symptoms worsen or if the condition fails to improve as anticipated.  I provided 16 minutes (1:50PM - 2:05 PM) of face-to-face time during this this encounter and > 50% was spent counseling as documented under my assessment and plan.    Lequita Asal, MD, PhD    06/21/2019, 2:05 PM  I, Samul Dada, am acting as a scribe for Lequita Asal, MD.  I, [place] [name], MD, have reviewed the above documentation for accuracy and completeness, and I agree with the above.

## 2019-06-20 ENCOUNTER — Inpatient Hospital Stay: Payer: Medicare Other | Attending: Hematology and Oncology

## 2019-06-20 ENCOUNTER — Other Ambulatory Visit: Payer: Self-pay

## 2019-06-20 DIAGNOSIS — D509 Iron deficiency anemia, unspecified: Secondary | ICD-10-CM | POA: Insufficient documentation

## 2019-06-20 DIAGNOSIS — D61818 Other pancytopenia: Secondary | ICD-10-CM | POA: Insufficient documentation

## 2019-06-20 DIAGNOSIS — M810 Age-related osteoporosis without current pathological fracture: Secondary | ICD-10-CM | POA: Insufficient documentation

## 2019-06-20 DIAGNOSIS — M1990 Unspecified osteoarthritis, unspecified site: Secondary | ICD-10-CM | POA: Insufficient documentation

## 2019-06-20 DIAGNOSIS — D696 Thrombocytopenia, unspecified: Secondary | ICD-10-CM | POA: Diagnosis not present

## 2019-06-20 DIAGNOSIS — Z79899 Other long term (current) drug therapy: Secondary | ICD-10-CM | POA: Diagnosis not present

## 2019-06-20 LAB — FERRITIN: Ferritin: 138 ng/mL (ref 24–336)

## 2019-06-20 LAB — CBC WITH DIFFERENTIAL/PLATELET
Abs Immature Granulocytes: 0.02 10*3/uL (ref 0.00–0.07)
Basophils Absolute: 0 10*3/uL (ref 0.0–0.1)
Basophils Relative: 0 %
Eosinophils Absolute: 0 10*3/uL (ref 0.0–0.5)
Eosinophils Relative: 0 %
HCT: 39.7 % (ref 39.0–52.0)
Hemoglobin: 12.9 g/dL — ABNORMAL LOW (ref 13.0–17.0)
Immature Granulocytes: 0 %
Lymphocytes Relative: 14 %
Lymphs Abs: 0.9 10*3/uL (ref 0.7–4.0)
MCH: 31.6 pg (ref 26.0–34.0)
MCHC: 32.5 g/dL (ref 30.0–36.0)
MCV: 97.3 fL (ref 80.0–100.0)
Monocytes Absolute: 0.5 10*3/uL (ref 0.1–1.0)
Monocytes Relative: 7 %
Neutro Abs: 4.8 10*3/uL (ref 1.7–7.7)
Neutrophils Relative %: 79 %
Platelets: 144 10*3/uL — ABNORMAL LOW (ref 150–400)
RBC: 4.08 MIL/uL — ABNORMAL LOW (ref 4.22–5.81)
RDW: 12.1 % (ref 11.5–15.5)
WBC: 6.2 10*3/uL (ref 4.0–10.5)
nRBC: 0 % (ref 0.0–0.2)

## 2019-06-20 LAB — IRON AND TIBC
Iron: 83 ug/dL (ref 45–182)
Saturation Ratios: 18 % (ref 17.9–39.5)
TIBC: 466 ug/dL — ABNORMAL HIGH (ref 250–450)
UIBC: 383 ug/dL

## 2019-06-20 LAB — SEDIMENTATION RATE: Sed Rate: 4 mm/hr (ref 0–20)

## 2019-06-21 ENCOUNTER — Encounter: Payer: Self-pay | Admitting: Hematology and Oncology

## 2019-06-21 ENCOUNTER — Inpatient Hospital Stay: Payer: Medicare Other

## 2019-06-21 ENCOUNTER — Inpatient Hospital Stay (HOSPITAL_BASED_OUTPATIENT_CLINIC_OR_DEPARTMENT_OTHER): Payer: Medicare Other | Admitting: Hematology and Oncology

## 2019-06-21 VITALS — BP 154/86 | HR 76 | Temp 98.1°F | Resp 18 | Wt 158.8 lb

## 2019-06-21 DIAGNOSIS — D509 Iron deficiency anemia, unspecified: Secondary | ICD-10-CM | POA: Diagnosis not present

## 2019-06-21 DIAGNOSIS — D61818 Other pancytopenia: Secondary | ICD-10-CM | POA: Diagnosis not present

## 2019-06-21 DIAGNOSIS — Z79899 Other long term (current) drug therapy: Secondary | ICD-10-CM | POA: Diagnosis not present

## 2019-06-21 DIAGNOSIS — D696 Thrombocytopenia, unspecified: Secondary | ICD-10-CM | POA: Diagnosis not present

## 2019-06-21 DIAGNOSIS — M1990 Unspecified osteoarthritis, unspecified site: Secondary | ICD-10-CM | POA: Diagnosis not present

## 2019-06-21 DIAGNOSIS — M810 Age-related osteoporosis without current pathological fracture: Secondary | ICD-10-CM | POA: Diagnosis not present

## 2019-06-21 NOTE — Progress Notes (Signed)
Pt in for follow up, pt reports he's being seen in pain management clinic for back pain, urologist for incontinence.  Pt is very tearful and "sad" one year anniversary of his wife is Feb 8.

## 2019-06-22 ENCOUNTER — Ambulatory Visit: Payer: Medicare Other | Admitting: Physician Assistant

## 2019-06-29 ENCOUNTER — Ambulatory Visit (INDEPENDENT_AMBULATORY_CARE_PROVIDER_SITE_OTHER): Payer: Medicare Other | Admitting: Physician Assistant

## 2019-06-29 ENCOUNTER — Other Ambulatory Visit: Payer: Self-pay

## 2019-06-29 ENCOUNTER — Encounter: Payer: Self-pay | Admitting: Physician Assistant

## 2019-06-29 DIAGNOSIS — N3941 Urge incontinence: Secondary | ICD-10-CM

## 2019-06-29 NOTE — Progress Notes (Signed)
PTNS  Session # 9 of 12  Health & Social Factors: no change Caffeine: 3 Alcohol: 0 Daytime voids #per day: 3 Night-time voids #per night: 2-3 Urgency: strong Incontinence Episodes #per day: 0 Ankle used: right Treatment Setting: 3 Feeling/ Response: sensory Comments: patient tolerated well  Performed By: Debroah Loop, PA-C   Follow Up: 1 week for PTNS #10 of 12

## 2019-07-04 DIAGNOSIS — M546 Pain in thoracic spine: Secondary | ICD-10-CM | POA: Diagnosis not present

## 2019-07-04 DIAGNOSIS — S32009K Unspecified fracture of unspecified lumbar vertebra, subsequent encounter for fracture with nonunion: Secondary | ICD-10-CM | POA: Diagnosis not present

## 2019-07-05 ENCOUNTER — Other Ambulatory Visit: Payer: Self-pay

## 2019-07-05 DIAGNOSIS — F4381 Prolonged grief disorder: Secondary | ICD-10-CM

## 2019-07-05 DIAGNOSIS — F419 Anxiety disorder, unspecified: Secondary | ICD-10-CM

## 2019-07-05 DIAGNOSIS — F4329 Adjustment disorder with other symptoms: Secondary | ICD-10-CM

## 2019-07-05 DIAGNOSIS — F4321 Adjustment disorder with depressed mood: Secondary | ICD-10-CM

## 2019-07-05 MED ORDER — FLUOXETINE HCL 20 MG PO CAPS: 20.0000 mg | ORAL_CAPSULE | Freq: Every day | ORAL | 1 refills | Status: DC

## 2019-07-06 ENCOUNTER — Ambulatory Visit: Payer: Medicare Other | Admitting: Physician Assistant

## 2019-07-13 ENCOUNTER — Ambulatory Visit (INDEPENDENT_AMBULATORY_CARE_PROVIDER_SITE_OTHER): Payer: Medicare Other | Admitting: Physician Assistant

## 2019-07-13 ENCOUNTER — Other Ambulatory Visit: Payer: Self-pay

## 2019-07-13 ENCOUNTER — Encounter: Payer: Self-pay | Admitting: Physician Assistant

## 2019-07-13 DIAGNOSIS — N3941 Urge incontinence: Secondary | ICD-10-CM | POA: Diagnosis not present

## 2019-07-13 NOTE — Progress Notes (Signed)
PTNS  Session # 10 of 12  Health & Social Factors: no change Caffeine: 3 Alcohol: 0 Daytime voids #per day: 5 Night-time voids #per night: 3 Urgency: mild Incontinence Episodes #per day: 1 Ankle used: right Treatment Setting: 19 Feeling/ Response: sensory Comments: patient tolerated well  Performed By: Debroah Loop, PA-C   Follow Up: 1 week for PTNS #11

## 2019-07-18 DIAGNOSIS — M1711 Unilateral primary osteoarthritis, right knee: Secondary | ICD-10-CM | POA: Diagnosis not present

## 2019-07-20 ENCOUNTER — Other Ambulatory Visit: Payer: Self-pay

## 2019-07-20 ENCOUNTER — Ambulatory Visit (INDEPENDENT_AMBULATORY_CARE_PROVIDER_SITE_OTHER): Payer: Medicare Other | Admitting: Physician Assistant

## 2019-07-20 DIAGNOSIS — N3941 Urge incontinence: Secondary | ICD-10-CM

## 2019-07-20 NOTE — Progress Notes (Signed)
PTNS  Session # 11 Health & Social Factors: no change Caffeine: 2 Alcohol: 0 Daytime voids #per day: 6 Night-time voids #per night: 2 Urgency: mild Incontinence Episodes #per day: 0 Ankle used: right Treatment Setting: 10 Feeling/ Response: Both Comments:   Performed by: Blondell Reveal  Follow Up: One week-as scheduled

## 2019-07-26 DIAGNOSIS — R2681 Unsteadiness on feet: Secondary | ICD-10-CM | POA: Diagnosis not present

## 2019-07-26 DIAGNOSIS — M25561 Pain in right knee: Secondary | ICD-10-CM | POA: Diagnosis not present

## 2019-07-27 ENCOUNTER — Other Ambulatory Visit: Payer: Self-pay

## 2019-07-27 ENCOUNTER — Ambulatory Visit (INDEPENDENT_AMBULATORY_CARE_PROVIDER_SITE_OTHER): Payer: Medicare Other | Admitting: Physician Assistant

## 2019-07-27 DIAGNOSIS — R350 Frequency of micturition: Secondary | ICD-10-CM

## 2019-07-27 NOTE — Progress Notes (Signed)
PTNS  Session # 12  Health & Social Factors: no change Caffeine: 2 Alcohol: 0 Daytime voids #per day: 3-4 Night-time voids #per night: 2-3 Urgency: none Incontinence Episodes #per day: 0 Ankle used: right Treatment Setting: 14 Feeling/ Response: sensory Comments: patient tolerated well  Performed By: Debroah Loop, PA-C   Follow Up: Return in about 4 weeks (around 08/24/2019) for PTNS maintenance.

## 2019-07-31 DIAGNOSIS — M25561 Pain in right knee: Secondary | ICD-10-CM | POA: Diagnosis not present

## 2019-07-31 DIAGNOSIS — R2681 Unsteadiness on feet: Secondary | ICD-10-CM | POA: Diagnosis not present

## 2019-08-07 DIAGNOSIS — R2681 Unsteadiness on feet: Secondary | ICD-10-CM | POA: Diagnosis not present

## 2019-08-07 DIAGNOSIS — M25561 Pain in right knee: Secondary | ICD-10-CM | POA: Diagnosis not present

## 2019-08-14 DIAGNOSIS — R2681 Unsteadiness on feet: Secondary | ICD-10-CM | POA: Diagnosis not present

## 2019-08-14 DIAGNOSIS — M25561 Pain in right knee: Secondary | ICD-10-CM | POA: Diagnosis not present

## 2019-08-21 DIAGNOSIS — R2681 Unsteadiness on feet: Secondary | ICD-10-CM | POA: Diagnosis not present

## 2019-08-21 DIAGNOSIS — M25561 Pain in right knee: Secondary | ICD-10-CM | POA: Diagnosis not present

## 2019-08-23 DIAGNOSIS — R2681 Unsteadiness on feet: Secondary | ICD-10-CM | POA: Diagnosis not present

## 2019-08-23 DIAGNOSIS — M25561 Pain in right knee: Secondary | ICD-10-CM | POA: Diagnosis not present

## 2019-08-27 ENCOUNTER — Ambulatory Visit: Payer: Medicare Other

## 2019-08-27 ENCOUNTER — Other Ambulatory Visit: Payer: Self-pay | Admitting: Family Medicine

## 2019-08-27 ENCOUNTER — Encounter: Payer: Self-pay | Admitting: Physician Assistant

## 2019-08-28 DIAGNOSIS — R2681 Unsteadiness on feet: Secondary | ICD-10-CM | POA: Diagnosis not present

## 2019-08-28 DIAGNOSIS — M25561 Pain in right knee: Secondary | ICD-10-CM | POA: Diagnosis not present

## 2019-09-03 ENCOUNTER — Ambulatory Visit (INDEPENDENT_AMBULATORY_CARE_PROVIDER_SITE_OTHER): Payer: Medicare Other

## 2019-09-03 ENCOUNTER — Other Ambulatory Visit: Payer: Self-pay

## 2019-09-03 DIAGNOSIS — R350 Frequency of micturition: Secondary | ICD-10-CM

## 2019-09-03 NOTE — Progress Notes (Signed)
PTNS  Session # Monthly Maintenence  Health & Social Factors: no change Caffeine: 3 Alcohol: 0 Daytime voids #per day: 3 Night-time voids #per night: 3 Urgency: Strong Incontinence Episodes #per day: 0 Ankle used: right Treatment Setting: 4 Feeling/ Response: Toe flex and sensory Comments: Pt tolerated well  Preformed By: Bradly Bienenstock, CMA   Follow Up: One month

## 2019-09-03 NOTE — Patient Instructions (Signed)
Tracking Your Bladder Symptoms    Patient Name:___________________________________________________   Sample: Day   Daytime Voids  Nighttime Voids Urgency for the Day(0-4) Number of Accidents Beverage Comments  Monday IIII II 2 I Water IIII Coffee  I      Week Starting:____________________________________   Day Daytime  Voids Nighttime  Voids Urgency for  The Day(0-4) Number of Accidents Beverages Comments                                                           This week my symptoms were:  O much better  O better O the same O worse   

## 2019-09-10 NOTE — Progress Notes (Signed)
Date:  09/11/2019   ID:  Bryan [name], DOB Feb 10, 1941, MRN [id number]  Patient Location:  [address] [place]   [address]   Provider location:   [facility], [place] office  PCP:  [name], MD  Cardiologist:  Arvid Right [facility]  Chief Complaint  Patient presents with  . other    6 month follow up. Meds reviewed by the pt. verbally. "doing well."     History of Present Illness:    Bryan [name] is a 79 y.o. 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 bicuspid aortic valve noted in 2009   cardiac catheterization showing mild to moderate CAD,  AVR with bioprosthetic valve, ascending aorta grafting in 2010,  hypertension,  hyperlipidemia,  mild chronic renal insufficiency, total knee replacement in 2004, appendix rupture and gallbladder surgery in 2010,  postoperative atrial fibrillation in 2010 pulm HTN on echo 2017 Valve with mean gradient 20 mm Hg who presents for routine follow-up of his bioprosthetic aortic valve and dilated ascending aorta   Doing well, sold house, moved to [employer] Very social Into basketball at [employer] back pain, multiple surgery  Lost his wife , kidney failure  06/2018 Weight loss Had vaccine  Night sweats , much better, started when losing weight Etiology unclear Minimal Testosterone: <3 On shots  No sx from atrial fib  EKG personally reviewed by myself on todays visit Shows atrial fibrillation ventricular rate 77 bpm left axis deviation no significant ST-T wave changes  Last  Echo 01/2019 reviewed with him in detail . The left ventricle has normal systolic function, with an ejection fraction of 55-60%. The cavity size was normal. Left ventricular diastolic Doppler parameters are indeterminate.  2. The right ventricle has normal systolic function.   3. Left atrial size was severely dilated.  There is mild dilatation of the aortic root  measuring 39 mm. The aortic valve has been repaired/replaced Aortic valve  regurgitation was not visualized by color flow Doppler. bioprosthetic  aortic valve, mean gradient 7mmHg.  (stable compared to 2017)  Other past medical history reviewed Several back surgeries Back surgery  5/18 chroni back and leg pain, Fx of L4, Cement  Fusion   Prior CV studies:   The following studies were reviewed today:  Echo 11/2015: Left ventricle:  The estimated ejection fraction was in therange of 50% to 55%.  - Aortic valve: A bioprosthesis was present. There was mildstenosis. Mean gradient (S): 20 mm Hg. Valve area (VTI): 1.24 cm^2. - Aortic root: The aortic root was mild to moderately dilated.44 mm (ED). Ascending aorta mildly dilated - Pulmonary arteries:moderately increased.PA peak pressure: 53 mm Hg (S).  Echocardiogram June 2016 discussed with him showing normal LV function, valve gradient consistent with bioprosthetic valve, moderately dilated ascending aorta, stable, 4.4 cm  Echocardiogram July 2015 showing mild to moderate aortic valve stenosis, 4.5 cm ascending aorta. No prior records available  Notes indicate some degree of carotid disease though details are not available.   Past Medical History:  Diagnosis Date  . Abnormal CT scan    Asymmetric left rectal wall thickening   . Anemia   . Anxiety   . Arrhythmia   . Chronic lower back pain   . Complication of anesthesia    Memory loss 09/2015  . Coronary artery disease   . Depression   . GERD (gastroesophageal reflux disease)   . Glaucoma   .  H/O thoracic aortic aneurysm repair   . Heart murmur   . History of being hospitalized    memory lose kidney funtion down blood pressure up  . History of blood transfusion    "think he had one when he had heart valve OR" (10/14/2016); "none since" (10/19/2017)  . History of chicken pox   . Hyperlipidemia   . Hypertension   . Lumbar stenosis   . Murmur   . Neuropathy   .  Osteoarthritis    worse in feet and ankles  . Paroxysmal A-fib ([place])   . Poor short term memory    takes Aricept  . Rheumatoid arthritis ([place])    "all over" (10/14/2016)  . Schizophrenia ([place])   . Seasonal allergies   . Valvular heart disease    Past Surgical History:  Procedure Laterality Date  . AORTIC VALVE REPLACEMENT  2007   [facility].  [place], [place]; "pig valve"  . APPENDECTOMY  2010  . BACK SURGERY    . CARDIAC VALVE REPLACEMENT    . CATARACT EXTRACTION W/ INTRAOCULAR LENS  IMPLANT, BILATERAL Bilateral 2012  . COLONOSCOPY WITH PROPOFOL N/A 09/24/2016   Procedure: COLONOSCOPY WITH PROPOFOL;  Surgeon: Jonathon Bellows, MD;  Location: [facility] ENDOSCOPY;  Service: Endoscopy;  Laterality: N/A;  . ESOPHAGOGASTRODUODENOSCOPY (EGD) WITH PROPOFOL N/A 09/24/2016   Procedure: ESOPHAGOGASTRODUODENOSCOPY (EGD) WITH PROPOFOL;  Surgeon: Jonathon Bellows, MD;  Location: [facility] ENDOSCOPY;  Service: Endoscopy;  Laterality: N/A;  . GIVENS CAPSULE STUDY N/A 09/24/2016   Procedure: GIVENS CAPSULE STUDY;  Surgeon: Jonathon Bellows, MD;  Location: [facility] ENDOSCOPY;  Service: Endoscopy;  Laterality: N/A;  . HAMMER TOE SURGERY Right 10/09/2015   Procedure: HAMMER TOE REPAIR WITH K-WIRE FIXATION RIGHT SECOND TOE;  Surgeon: Albertine Patricia, DPM;  Location: [place];  Service: Podiatry;  Laterality: Right;  WITH LOCAL  . INGUINAL HERNIA REPAIR Right 05/05/2018   Medium Bard PerFix plug.  Surgeon: Robert Bellow, MD;  Location: [facility] ORS;  Service: General;  Laterality: Right;  . IR VERTEBROPLASTY CERV/THOR BX INC UNI/BIL INC/INJECT/IMAGING  10/27/2018  . JOINT REPLACEMENT Left    left total knee  . LAMINECTOMY WITH POSTERIOR LATERAL ARTHRODESIS LEVEL 3 N/A 09/28/2017   Procedure: LUMBAR  POSTEROR  FUSION REVISION - LUMBAR ONE -TWO, LUMBAR TWO -THREE,THREE-FOUR ,BILATERAL ARTHRODESIS REMOVAL LUMBAR THREE HARDWARE;  Surgeon: Kary Kos, MD;  Location: [facility];  Service: Neurosurgery;  Laterality: N/A;  . LAMINECTOMY WITH  POSTERIOR LATERAL ARTHRODESIS LEVEL 3 N/A 10/20/2017   Procedure: Revision fusion and removal of hardware Lumbar one, Pedicle screw fixation thoracic ten-lumbar two, thoracic nine-ten laminotomy, Posterior Lumbar Arthrodesis thoracic ten-lumbar two ;  Surgeon: Kary Kos, MD;  Location: [place];  Service: Neurosurgery;  Laterality: N/A;  . LAPAROSCOPIC CHOLECYSTECTOMY  2010  . LUMBAR FUSION Right 06/08/2017   LUMBAR THREE-FOUR, LUMBAR FOUR-FIVE POSTEROLATERAL ARTHRODESIS WITH RIGHT LUMBAR FOUR-FIVE LAMINECTOMY/FORAMINOTOMY  . LUMBAR LAMINECTOMY/DECOMPRESSION MICRODISCECTOMY Right 03/08/2016   Procedure: Laminectomy and Foraminotomy - Lumbar Five-Sacral One Right;  Surgeon: Kary Kos, MD;  Location: [place];  Service: Neurosurgery;  Laterality: Right;  Right  . MULTIPLE TOOTH EXTRACTIONS     "3"  . POSTERIOR LUMBAR FUSION  10/14/2016   L5-S1  . REPLACEMENT TOTAL KNEE Left 2003  . THORACIC AORTIC ANEURYSM REPAIR  2010     Current Meds  Medication Sig  . amLODipine (NORVASC) 5 MG tablet Take 1 tablet (5 mg) by mouth once daily  . apixaban (ELIQUIS) 5 MG TABS tablet Take 1 tablet (5  mg total) by mouth 2 (two) times daily.  Marland Kitchen aspirin EC 81 MG tablet Take 1 tablet (81 mg total) by mouth daily.  Marland Kitchen atorvastatin (LIPITOR) 40 MG tablet TAKE ONE TABLET BY MOUTH EVERY DAY  . Calcium Carbonate (CALCARB 600 PO) Take 600 mg by mouth daily.  . cetirizine (ZYRTEC) 10 MG tablet Take 10 mg by mouth daily.  . cholecalciferol (VITAMIN D3) 25 MCG (1000 UT) tablet Take 1,000 Units by mouth daily.  . fenofibrate 160 MG tablet Take 1 tablet (160 mg total) by mouth at bedtime.  Marland Kitchen FLUoxetine (PROZAC) 20 MG capsule Take 1 capsule (20 mg total) by mouth daily.  . folic acid (FOLVITE) 1 MG tablet TAKE ONE TABLET BY MOUTH TWICE DAILY  . HYDROcodone-acetaminophen (NORCO) 10-325 MG tablet Take 1 tablet by mouth every 6 (six) hours as needed.  . hydrocortisone cream 1 % Apply 1 application topically daily as needed for  itching.  . hydroxychloroquine (PLAQUENIL) 200 MG tablet Take 200 mg by mouth every other day.   . lisinopril (PRINIVIL,ZESTRIL) 20 MG tablet Take 1 tablet (20 mg total) by mouth every evening.  . Omeprazole 20 MG TBEC Take 1 tablet (20 mg total) by mouth daily.  Marland Kitchen testosterone cypionate (DEPOTESTOSTERONE CYPIONATE) 200 MG/ML injection Inject 0.25 cc every 2 weeks  . vitamin B-12 (CYANOCOBALAMIN) 1000 MCG tablet Take 1,000 mcg by mouth daily.     Allergies:   Penicillins and Demerol [meperidine]   Social History   Tobacco Use  . Smoking status: Former Smoker    Packs/day: 1.00    Years: 32.00    Pack years: 32.00    Types: Cigarettes    Quit date: 07/05/1981    Years since quitting: 38.2  . Smokeless tobacco: Never Used  Substance Use Topics  . Alcohol use: Yes    Comment: 10/19/2017 "glass of wine/month; if that"  . Drug use: Never     Family Hx: The patient's family history includes Asthma in his mother; Heart attack (age of onset: 36) in his father; Heart failure in his mother; Hypertension in his mother and sister; Osteoporosis in his mother. There is no history of Prostate cancer, Bladder Cancer, Kidney cancer, or Colon cancer.  ROS:   Please see the history of present illness.    Review of Systems  Constitutional: Negative.   HENT: Negative.   Respiratory: Negative.   Cardiovascular: Negative.   Gastrointestinal: Negative.   Musculoskeletal: Negative.   Neurological: Negative.   Psychiatric/Behavioral: Negative.   All other systems reviewed and are negative.    Labs/Other Tests and Data Reviewed:    Recent Labs: 10/27/2018: BUN 26; Creatinine, Ser 1.29; Potassium 4.1; Sodium 138 12/19/2018: TSH 0.892 06/20/2019: Hemoglobin 12.9; Platelets 144   Recent Lipid Panel Lab Results  Component Value Date/Time   CHOL 112 12/16/2016 10:06 AM   TRIG 90.0 12/16/2016 10:06 AM   HDL 49.40 12/16/2016 10:06 AM   CHOLHDL 2 12/16/2016 10:06 AM   LDLCALC 45 12/16/2016 10:06 AM      Wt Readings from Last 3 Encounters:  09/11/19 160 lb 4 oz (72.7 kg)  06/21/19 158 lb 13.5 oz (72 kg)  05/04/19 165 lb (74.8 kg)     Exam:    BP 110/70 (BP Location: Left Arm, Patient Position: Sitting, Cuff Size: Normal)   Pulse 77   Ht 5' 11.5" (1.816 m)   Wt 160 lb 4 oz (72.7 kg)   SpO2 98%   BMI 22.04 kg/m  Constitutional:  oriented  to person, place, and time. No distress.  Appears pale HENT:  Head: Grossly normal Eyes:  no discharge. No scleral icterus.  Neck: No JVD, no carotid bruits  Cardiovascular: Regular rate and rhythm, 1/2 SEM right sternal border Pulmonary/Chest: Clear to auscultation bilaterally, no wheezes or rails Abdominal: Soft.  no distension.  no tenderness.  Musculoskeletal: Normal range of motion Neurological:  normal muscle tone. Coordination normal. No atrophy Skin: Skin warm and dry Psychiatric: normal affect, pleasant   ASSESSMENT & PLAN:    Paroxysmal atrial fibrillation ([place]) -  Remote history of postoperative atrial fibrillation in 2010 Remains in atrial fibrillation, noted on last clinic visit Declined cardioversion as he was asymptomatic He has severely dilated left atrium on echocardiogram Tolerating Eliquis 5 mg twice daily Aspirin  Essential hypertension - Plan: EKG 12-Lead Blood pressure is well controlled on today's visit. No changes made to the medications.  CAD in native artery - Plan: EKG 12-Lead Currently with no symptoms of angina. No further workup at this time. Continue current medication regimen. Stable  Aneurysm of ascending aorta, s/post repair/grafting Echocardiogram with no significant change in aorta size 3.9 cm Seen on echo  Mixed hyperlipidemia Medic weight loss over the past year or 2, stable  S/P AVR (aortic valve replacement) Aortic valve stable, mild stenosis noted Stable aortic valve On aspirin   Cognitive decline Reports his symptoms are stable Appropriate on today's  visit  Bradycardia Heart rate in the 70s, rhythm change now atrial fibrillation   Total encounter time more than 25 minutes  Greater than 50% was spent in counseling and coordination of care with the patient    Disposition: Follow-up in 6 months   Signed, Ida Rogue, MD  09/11/2019 3:29 PM    [place] Office [address] [place] [address], [place], [place] [address]

## 2019-09-11 ENCOUNTER — Encounter: Payer: Self-pay | Admitting: Cardiovascular Disease

## 2019-09-11 ENCOUNTER — Ambulatory Visit (INDEPENDENT_AMBULATORY_CARE_PROVIDER_SITE_OTHER): Payer: Medicare Other | Admitting: Cardiovascular Disease

## 2019-09-11 VITALS — BP 110/70 | HR 77 | Ht 71.5 in | Wt 160.2 lb

## 2019-09-11 DIAGNOSIS — I25118 Atherosclerotic heart disease of native coronary artery with other forms of angina pectoris: Secondary | ICD-10-CM

## 2019-09-11 DIAGNOSIS — I10 Essential (primary) hypertension: Secondary | ICD-10-CM

## 2019-09-11 DIAGNOSIS — I6523 Occlusion and stenosis of bilateral carotid arteries: Secondary | ICD-10-CM

## 2019-09-11 DIAGNOSIS — I712 Thoracic aortic aneurysm, without rupture: Secondary | ICD-10-CM | POA: Diagnosis not present

## 2019-09-11 DIAGNOSIS — I480 Paroxysmal atrial fibrillation: Secondary | ICD-10-CM

## 2019-09-11 DIAGNOSIS — I7121 Aneurysm of the ascending aorta, without rupture: Secondary | ICD-10-CM

## 2019-09-11 DIAGNOSIS — N179 Acute kidney failure, unspecified: Secondary | ICD-10-CM

## 2019-09-11 DIAGNOSIS — Z952 Presence of prosthetic heart valve: Secondary | ICD-10-CM

## 2019-09-11 DIAGNOSIS — R4189 Other symptoms and signs involving cognitive functions and awareness: Secondary | ICD-10-CM | POA: Diagnosis not present

## 2019-09-11 NOTE — Patient Instructions (Addendum)
Lipid , CMP panel today  Medication Instructions:  No changes  If you need a refill on your cardiac medications before your next appointment, please call your pharmacy.    Lab work: No new labs needed   If you have labs (blood work) drawn today and your tests are completely normal, you will receive your results only by: Marland Kitchen MyChart Message (if you have MyChart) OR . A paper copy in the mail If you have any lab test that is abnormal or we need to change your treatment, we will call you to review the results.   Testing/Procedures: No new testing needed   Follow-Up: At [facility], you and your health needs are our priority.  As part of our continuing mission to provide you with exceptional heart care, we have created designated Provider Care Teams.  These Care Teams include your primary Cardiologist (physician) and Advanced Practice Providers (APPs -  Physician Assistants and Nurse Practitioners) who all work together to provide you with the care you need, when you need it.  . You will need a follow up appointment in 6 months   . Providers on your designated Care Team:   . Murray Hodgkins, NP . Christell Faith, PA-C . Marrianne Mood, PA-C  Any Other Special Instructions Will Be Listed Below (If Applicable).  For educational health videos Log in to : [email or web address] Or : [email or web address], password : [email or web address]

## 2019-09-12 LAB — COMPREHENSIVE METABOLIC PANEL
ALT: 16 IU/L (ref 0–44)
AST: 30 IU/L (ref 0–40)
Albumin/Globulin Ratio: 2 (ref 1.2–2.2)
Albumin: 4.6 g/dL (ref 3.6–4.6)
Alkaline Phosphatase: 78 IU/L (ref 39–117)
BUN/Creatinine Ratio: 17 (ref 10–24)
BUN: 21 mg/dL (ref 8–27)
Bilirubin Total: 0.3 mg/dL (ref 0.0–1.2)
CO2: 22 mmol/L (ref 20–29)
Calcium: 9.6 mg/dL (ref 8.6–10.2)
Chloride: 102 mmol/L (ref 96–106)
Creatinine, Ser: 1.26 mg/dL (ref 0.76–1.27)
GFR calc Af Amer: 61 mL/min/{1.73_m2} (ref >59)
GFR calc non Af Amer: 53 mL/min/{1.73_m2} — ABNORMAL LOW (ref >59)
Globulin, Total: 2.3 g/dL (ref 1.5–4.5)
Glucose: 149 mg/dL — ABNORMAL HIGH (ref 65–99)
Potassium: 4.5 mmol/L (ref 3.5–5.2)
Sodium: 139 mmol/L (ref 134–144)
Total Protein: 6.9 g/dL (ref 6.0–8.5)

## 2019-09-12 LAB — LIPID PANEL
Chol/HDL Ratio: 2.1 ratio (ref 0.0–5.0)
Cholesterol, Total: 123 mg/dL (ref 100–199)
HDL: 58 mg/dL (ref >39)
LDL Chol Calc (NIH): 47 mg/dL (ref 0–99)
Triglycerides: 96 mg/dL (ref 0–149)
VLDL Cholesterol Cal: 18 mg/dL (ref 5–40)

## 2019-09-17 ENCOUNTER — Telehealth: Payer: Self-pay | Admitting: *Deleted

## 2019-09-17 NOTE — Telephone Encounter (Signed)
Reviewed results with patient and he verbalized understanding with no further questions at this time. 

## 2019-09-17 NOTE — Telephone Encounter (Signed)
Left voicemail message for patient to call back for results.  

## 2019-09-17 NOTE — Telephone Encounter (Signed)
-----   Message from Minna Merritts, MD sent at 09/16/2019  4:39 PM EDT ----- Excellent cholesterol numbers  stable CMP

## 2019-09-17 NOTE — Telephone Encounter (Signed)
Patient calling to discuss recent testing results  ° °Please call  ° °

## 2019-09-22 ENCOUNTER — Other Ambulatory Visit: Payer: Self-pay | Admitting: Cardiovascular Disease

## 2019-09-28 ENCOUNTER — Other Ambulatory Visit: Payer: Self-pay | Admitting: Neurosurgery

## 2019-09-28 DIAGNOSIS — M546 Pain in thoracic spine: Secondary | ICD-10-CM

## 2019-10-01 DIAGNOSIS — M1711 Unilateral primary osteoarthritis, right knee: Secondary | ICD-10-CM | POA: Diagnosis not present

## 2019-10-01 DIAGNOSIS — G609 Hereditary and idiopathic neuropathy, unspecified: Secondary | ICD-10-CM | POA: Diagnosis not present

## 2019-10-02 DIAGNOSIS — Z961 Presence of intraocular lens: Secondary | ICD-10-CM | POA: Diagnosis not present

## 2019-10-02 DIAGNOSIS — H26493 Other secondary cataract, bilateral: Secondary | ICD-10-CM | POA: Diagnosis not present

## 2019-10-02 DIAGNOSIS — H35033 Hypertensive retinopathy, bilateral: Secondary | ICD-10-CM | POA: Diagnosis not present

## 2019-10-02 DIAGNOSIS — E7800 Pure hypercholesterolemia, unspecified: Secondary | ICD-10-CM | POA: Diagnosis not present

## 2019-10-02 DIAGNOSIS — I10 Essential (primary) hypertension: Secondary | ICD-10-CM | POA: Diagnosis not present

## 2019-10-03 ENCOUNTER — Other Ambulatory Visit: Payer: Self-pay

## 2019-10-03 ENCOUNTER — Ambulatory Visit
Admission: RE | Admit: 2019-10-03 | Discharge: 2019-10-03 | Disposition: A | Discharge status: Home / Self Care | Payer: Medicare Other | Source: Ambulatory Visit | Attending: Neurosurgery | Admitting: Neurosurgery

## 2019-10-03 DIAGNOSIS — S22070A Wedge compression fracture of T9-T10 vertebra, initial encounter for closed fracture: Secondary | ICD-10-CM | POA: Diagnosis not present

## 2019-10-03 DIAGNOSIS — M546 Pain in thoracic spine: Secondary | ICD-10-CM | POA: Diagnosis not present

## 2019-10-05 ENCOUNTER — Ambulatory Visit: Payer: Self-pay

## 2019-10-08 ENCOUNTER — Ambulatory Visit: Payer: Medicare Other

## 2019-10-10 DIAGNOSIS — H26491 Other secondary cataract, right eye: Secondary | ICD-10-CM | POA: Diagnosis not present

## 2019-10-16 DIAGNOSIS — I10 Essential (primary) hypertension: Secondary | ICD-10-CM | POA: Diagnosis not present

## 2019-10-16 DIAGNOSIS — M546 Pain in thoracic spine: Secondary | ICD-10-CM | POA: Diagnosis not present

## 2019-10-16 DIAGNOSIS — M5440 Lumbago with sciatica, unspecified side: Secondary | ICD-10-CM | POA: Diagnosis not present

## 2019-10-18 ENCOUNTER — Other Ambulatory Visit: Payer: Self-pay | Admitting: Family Medicine

## 2019-10-18 DIAGNOSIS — F4329 Adjustment disorder with other symptoms: Secondary | ICD-10-CM

## 2019-10-18 DIAGNOSIS — F4381 Prolonged grief disorder: Secondary | ICD-10-CM

## 2019-10-18 DIAGNOSIS — F419 Anxiety disorder, unspecified: Secondary | ICD-10-CM

## 2019-10-19 ENCOUNTER — Other Ambulatory Visit: Payer: Self-pay

## 2019-10-19 ENCOUNTER — Other Ambulatory Visit: Payer: Self-pay | Admitting: Neurosurgery

## 2019-10-19 MED ORDER — LISINOPRIL 20 MG PO TABS: 20.0000 mg | ORAL_TABLET | Freq: Every evening | ORAL | 1 refills | Status: DC

## 2019-10-23 ENCOUNTER — Telehealth: Payer: Self-pay | Admitting: Cardiovascular Disease

## 2019-10-23 NOTE — Telephone Encounter (Signed)
   [facility] Pre-operative Risk Assessment    HEARTCARE STAFF: - Please ensure there is not already an duplicate clearance open for this procedure. - Under Visit Info/Reason for Call, type in Other and utilize the format Clearance MM/DD/YY or Clearance TBD. Do not use dashes or single digits. - If request is for dental extraction, please clarify the # of teeth to be extracted.  Request for surgical clearance:  1. What type of surgery is being performed? Remvoal of Thoracic Spine  2. When is this surgery scheduled?  10/29/19 3. What type of clearance is required (medical clearance vs. Pharmacy clearance to hold med vs. Both)? Not listed 4. Are there any medications that need to be held prior to surgery and how long?not listed Practice name and name of physician performing surgery? [facility] Dr. Kary Kos 5. What is the office phone number? [phone]   7.   What is the office fax number? [phone]  8.   Anesthesia type (None, local, MAC, general) ? General   Elissa Hefty 10/23/2019, 8:11 AM  _________________________________________________________________   (provider comments below)

## 2019-10-23 NOTE — Telephone Encounter (Signed)
Called the requesting office and asked that they give the preop pool a call back for clarity of procedure type.

## 2019-10-23 NOTE — Telephone Encounter (Signed)
Please clarify the nature of surgery again with the surgeon's office. The type of surgery is currently listed as "Removal of Thoracic Spine" which does not make any sense.

## 2019-10-24 ENCOUNTER — Other Ambulatory Visit: Payer: Self-pay | Admitting: Endocrinology

## 2019-10-24 NOTE — Telephone Encounter (Signed)
Attempted to reach requesting office to clarify what type of surgery. No answer. Lvm

## 2019-10-24 NOTE — Telephone Encounter (Signed)
Call returned by Lorriane Shire from requesting office, she states that pt is having Removal of hardware of the thoracic spine under general anesthesia

## 2019-10-25 NOTE — Telephone Encounter (Signed)
Spoke with Bryan [name], he has significant neuropathy in his feet and walks around with a walker. He denies any recent chest pain or shortness of breath. He feels like he can climb 2 flights of stairs and walk 2 blocks away from his home, however he says that is probably his limit right now. We discussed the importance for him to hold Eliquis for 3 days prior to the procedure. Since aspirin also needs to be held, I recommended holding the aspirin for 5-7 days. He says he was previously told aspirin only need to be held for 3 days, same length as the Eliquis. It did not make sense for him to hold aspirin for that long. 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   Will forward to Dr. [name]: 1. Patient can barely achieve 4 METS of activity, do you recommend any other workup? 2. Please comment on how long to hold aspirin, would you be ok with holding it for 3 days instead of 5-7 days for a back surgery?  Please forward your response to P CV DIV PREOP  Thank you  [name]

## 2019-10-25 NOTE — Telephone Encounter (Signed)
Patient returning call.

## 2019-10-25 NOTE — Telephone Encounter (Signed)
Clinical pharmacist to review Eliquis 

## 2019-10-25 NOTE — Telephone Encounter (Signed)
Patient with diagnosis of afib on Eliquis for anticoagulation.    Procedure: removal of hardware from thoracic spine Date of procedure: 10/29/19  CHADS2-VASc score of 4 (age x2, HTN, CAD)  CrCl 58mL/min Platelet count 144K  Per office protocol, patient can hold Eliquis for 3 days prior to procedure.

## 2019-10-25 NOTE — Telephone Encounter (Signed)
Left voicemail for the patient to call cardiology office and talk to the preop APP of the day

## 2019-10-26 ENCOUNTER — Other Ambulatory Visit: Payer: Self-pay | Admitting: Endocrinology

## 2019-10-26 NOTE — Telephone Encounter (Signed)
Ok to hold eliquis 3 days He should verify with neurosurg how many days they need off asa (if at all) 3 to 5 day hold of asa depending on neurosurg acceptable

## 2019-10-27 ENCOUNTER — Other Ambulatory Visit: Payer: Self-pay | Admitting: Family Medicine

## 2019-10-29 NOTE — Telephone Encounter (Signed)
Hi Dr. [name],   Just wanted to follow-up one thing from [name]'s message last week. Bryan [name] mentioned that patient could barely achieve 4 METS, so I just wanted to make you sure you did not feel like he need any other ischemic work-up prior to surgery?  Please route response to P CV DIV PREOP.  Thank you!

## 2019-10-29 NOTE — Telephone Encounter (Signed)
I called and left patient a voicemail to call back for Eliquis instructions.

## 2019-10-29 NOTE — Telephone Encounter (Signed)
I would proceed, he does not have a long history of angina, obstructive coronary disease I do not think further testing would help [place] and would delay the procedure

## 2019-10-30 ENCOUNTER — Other Ambulatory Visit
Admission: RE | Admit: 2019-10-30 | Discharge: 2019-10-30 | Disposition: A | Discharge status: Home / Self Care | Payer: Medicare Other | Source: Ambulatory Visit | Attending: Neurosurgery | Admitting: Neurosurgery

## 2019-10-30 ENCOUNTER — Other Ambulatory Visit: Payer: Self-pay

## 2019-10-30 ENCOUNTER — Other Ambulatory Visit (HOSPITAL_COMMUNITY): Payer: Medicare Other

## 2019-10-30 DIAGNOSIS — Z20822 Contact with and (suspected) exposure to covid-19: Secondary | ICD-10-CM | POA: Diagnosis not present

## 2019-10-30 DIAGNOSIS — Z01812 Encounter for preprocedural laboratory examination: Secondary | ICD-10-CM | POA: Diagnosis not present

## 2019-10-30 NOTE — Telephone Encounter (Signed)
Left message for the patient to call back.

## 2019-10-30 NOTE — Telephone Encounter (Signed)
   Primary Cardiologist: Ida Rogue, MD  Chart reviewed as part of pre-operative protocol coverage. Patient was last seen by Dr. [name] on 09/11/2019. He was contact by Almyra Deforest, PA-C, on 10/25/2019 for pre-op risk assessment. He denied any chest pain or shortness of breath. Activity limited by significant neuropathy and patient can barely achieve 4 METS of activity. Discussed with Dr. [name] who felt like patient was OK to proceed with surgery without any additional cardiac evaluation.   Per Dr. [name]: OK to hold Eliquis 3 days. He should verify with Neurosurgery how many days they need off Aspirin (if at all). 3 to 5 day hold of Aspirin depending on Neurosurgery is acceptable.   Please resume Eliquis and Aspirin as soon as possible following surgery.  I will route this recommendation to the requesting party via Epic fax function and remove from pre-op pool.  Please call with questions.  Darreld Mclean, PA-C 10/30/2019, 9:14 AM

## 2019-10-31 LAB — SARS CORONAVIRUS 2 (TAT 6-24 HRS): SARS Coronavirus 2: NEGATIVE

## 2019-10-31 NOTE — Telephone Encounter (Signed)
ADDENDUM: Would like to correct surg clearance procedure to be done. The correct procedure being done is Removal of Hardware within the Thoracic Spine.

## 2019-10-31 NOTE — Telephone Encounter (Signed)
I s/w the pt and he has been made aware of recommendations in regards to holding medication. Pt thanked me for the call. I will forward updated notes to surgeon's office.

## 2019-11-01 ENCOUNTER — Other Ambulatory Visit: Payer: Self-pay

## 2019-11-01 ENCOUNTER — Encounter (HOSPITAL_COMMUNITY): Payer: Self-pay | Admitting: Neurosurgery

## 2019-11-01 NOTE — Anesthesia Preprocedure Evaluation (Addendum)
Anesthesia Evaluation  Patient identified by MRN, date of birth, ID band Patient awake    Reviewed: Allergy & Precautions, NPO status , Patient's Chart, lab work & pertinent test results  History of Anesthesia Complications (+) history of anesthetic complications (memory problems in 2017 following MAC )  Airway Mallampati: II  TM Distance: >3 FB Neck ROM: Full    Dental  (+) Dental Advisory Given, Missing   Pulmonary former smoker,    Pulmonary exam normal        Cardiovascular hypertension, Pt. on medications + CAD  + dysrhythmias Atrial Fibrillation + Valvular Problems/Murmurs (s/p AVR)  Rhythm:Irregular Rate:Normal   '20 TTE - EF 55-60%. RV cavity was mildly enlarged. Right ventricular systolic pressure is mildly elevated with an estimated pressure of 37.5 mmHg. LA was severely dilated. Pulmonic valve regurgitation is trivial by color flow Doppler. There is mild dilatation of the aortic root measuring 39 mm.   Hx thoracic AAA repair and AVR 2010  Preoperative cardiology input outlined on 10/30/19 by Sande Rives, PA-C: "...Discussed with Dr. [name] who felt like patient was OK to proceed with surgery without any additional cardiac evaluation.  Per Dr. Juanna Cao hold Eliquis 3 days.He should verify withNeurosurgeryhow many days they need off Aspirin(if at all).3 to 5 day hold ofAspirindepending onNeurosurgery isacceptable. Please resume Eliquis and Aspirin as soon as possible following surgery."    Neuro/Psych  Headaches, PSYCHIATRIC DISORDERS Anxiety Depression    GI/Hepatic Neg liver ROS, GERD  Medicated and Controlled, IBS    Endo/Other  negative endocrine ROS  Renal/GU negative Renal ROS     Musculoskeletal  (+) Arthritis , Rheumatoid disorders,    Abdominal   Peds  Hematology  On eliquis    Anesthesia Other Findings Covid test negative 6/8 Anaphylaxis to PCN   Reproductive/Obstetrics                           Anesthesia Physical Anesthesia Plan  ASA: III  Anesthesia Plan: General   Post-op Pain Management:    Induction: Intravenous  PONV Risk Score and Plan: 2 and Treatment may vary due to age or medical condition, Ondansetron and Propofol infusion  Airway Management Planned: Oral ETT  Additional Equipment: None  Intra-op Plan:   Post-operative Plan: Extubation in OR  Informed Consent: I have reviewed the patients History and Physical, chart, labs and discussed the procedure including the risks, benefits and alternatives for the proposed anesthesia with the patient or authorized representative who has indicated his/her understanding and acceptance.     Dental advisory given  Plan Discussed with: CRNA and Anesthesiologist  Anesthesia Plan Comments: (No benzodiazepines given problems in 2017 following midazolam administration)      Anesthesia Quick Evaluation

## 2019-11-01 NOTE — Progress Notes (Signed)
Anesthesia Chart Review:  Case: [id number] Date/Time: 11/02/19 1254   Procedure: Removal of hardware thoracic spine (N/A Back)   Anesthesia type: General   Pre-op diagnosis: Thoracic spine pain   Location: [facility] OR ROOM 20 / [place] [facility]   Surgeons: Kary Kos, MD      DISCUSSION: Patient is a 79 year old male scheduled for the above procedure.   History includes former smoker(quit '83), CAD ("mild to moderate nonobstructive 2v" 2010), bicuspid aortic valve with ascending aorticaneurysm (s/pAVR,#25 Mosaic porcine valve,and TAArep[facility]. [facility]), moderate (mean grad 20 mmHg) aortic stenosis and moderately dilated aortic root(2020 echo), PAF/afib(post cardiac surgery 2010; recurrent 02/2019, declined DCCV), HTN, HLD, glaucoma, RA, multiple back surgeries (right L5-S1 laminectomy/foraminotomy 03/08/16; L5-S1 PLIF 10/14/16; exploration/removal of rods L5-S1 and L3-5 posterior lateral arthrodesis 06/08/17; reexpoloration of fusion with removal of L3-S1 hardware, placement of screws L1-2, posterior lateral arthrodesis L1-L5 09/28/17; revision fusion with removal/cutting road between L1-L2, removal L1 screws, pedical screw fixation/posterolateral arthrodesis T10-12 10/20/17). He has moderate pulmonary hypertension by 2016 and 2017 echocardiograms.   Hereported memory loss following his May 2017 surgery (done under MAC anesthesia; midazolam, propofol, lidocaine). He apparently had to be admitted 10/11/15-10/13/15 for altered mental status following surgery. Heelevated Cr with nausea and poor PO intake requiring hydration.  Preoperative cardiology input outlined on 10/30/19 by Sande Rives, PA-C: "... Discussed with Dr. [name] who felt like patient was OK to proceed with surgery without any additional cardiac evaluation.   Per Dr. [name]: OK to hold Eliquis 3 days. He should verify with Neurosurgery how many days they need off Aspirin (if at all). 3 to 5 day hold of  Aspirin depending on Neurosurgery is acceptable.   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 He lives in [place].  Last ASA 10/27/19, last Eliquis 10/29/19.  Presurgical COVID-19 test negative on 10/30/2019.  Anesthesia team to evaluate on the day of surgery, and he will get updated labs.   VS:   Wt Readings from Last 3 Encounters:  09/11/19 72.7 kg  06/21/19 72 kg  05/04/19 74.8 kg   BP Readings from Last 3 Encounters:  09/11/19 110/70  06/21/19 (!) 154/86  05/04/19 134/74   Pulse Readings from Last 3 Encounters:  09/11/19 77  06/21/19 76  05/04/19 80    PROVIDERS: [name], MD is PCP  - Ida Rogue, MD is cardiologist. Last visit 09/11/19. Six month follow-up planned. Gurney Maxin, MD is neurologist - Percell Boston, MD is rheumatologist - Renato Shin, MD is endocrinologist (for osteoporosis) - Nolon Stalls, MD is HEM-ONC. Last seen on 06/21/19. As needed follow-up recommended. Had work-up in 2018 for asymmetric left rectal wall thickening on CT and pancytopenia. He underwent bone marrow biopsy on 11/04/16 that showed normocellular marrow (20%) with trilineage hematopoiesis and maturation, mild megakaryocytic ayptia, normocytic anemia and leukopenia. Leukocytes morphologically unremarkable. Erling Conte, MD is nephrologist [facility]) -- Urologist is with [place]. Last encounter 07/27/19 with Debroah Loop, PA-C for PTNS.    LABS: For day of surgery.  Lab Results  Component Value Date   WBC 6.2 06/20/2019   HGB 12.9 (L) 06/20/2019   HCT 39.7 06/20/2019   PLT 144 (L) 06/20/2019   GLUCOSE 149 (H) 09/11/2019   ALT 16 09/11/2019   AST 30 09/11/2019   NA 139 09/11/2019   K 4.5 09/11/2019   CL 102 09/11/2019  CREATININE 1.26 09/11/2019   BUN 21 09/11/2019   CO2 22 09/11/2019    IMAGES: CT T-spine  10/03/19: IMPRESSION: 1. Augmentation of the T9 compression fracture since the CT last year. Underlying osteopenia with widespread thoracic and upper lumbar compression otherwise stable from last year. 2. Chronic loosening of bilateral T10 and right T11 pedicle screws with new vacuum disc at T10-T11, and no convincing posterior element arthrodesis at either level. But there does appear to be posterior element arthrodesis at T12-L1. 3. Facet arthropathy at the T8-T9 level with vacuum phenomena. Trace new vacuum disc at T7-T8. 4. Aortic Atherosclerosis (ICD10-I70.0).   EKG: 09/11/19 ([facility]): Afib at 77 bpm. Left axis deviation. Non-specific ST abnormality.    CV: Echo 02/07/19: IMPRESSIONS  1. The left ventricle has normal systolic function, with an ejection  fraction of 55-60%. The cavity size was normal. Left ventricular diastolic  Doppler parameters are indeterminate.  2. The right ventricle has normal systolic function. The cavity was  mildly enlarged. There is Right vetricular wall thickness was not  assessed. Right ventricular systolic pressure is mildly elevated with an  estimated pressure of 37.5 mmHg.  3. Left atrial size was severely dilated.  4. Right atrial size was normal.  5. The pulmonic valve was not well visualized. Pulmonic valve  regurgitation is trivial by color flow Doppler.  6. The aorta is abnormal unless otherwise noted.  7. There is mild dilatation of the aortic root measuring 39 mm.  Ao Root diam: 4.20 cm   Ao Asc diam: 3.10 cm  Ao Arch diam: 3.0 cm  8. Aortic Valve: The aortic valve has been repaired/replaced Aortic valve  regurgitation was not visualized by color flow Doppler. bioprosthetic  aortic valve, mean gradient 28mHg.  (Comparison echo 11/27/15: LVEF 50-55%, AV bioprosthesis, moderate AS, mean gradient 20 mmHg, aortic root 44 mm, PA peak pressure 53 mmHg)   By notes, he had mild to moderate non-obstructive CAD by cath done prior  to AVR/TAA repair.    Carotid U/S1/10/17:  Impressions: Normal carotid arteries, bilaterally. Patent vertebral arteries with antegrade flow. Normal subclavian arteries, bilaterally.   Past Medical History:  Diagnosis Date  . Abnormal CT scan    Asymmetric left rectal wall thickening   . Anemia   . Anxiety   . Arrhythmia   . Chronic lower back pain   . Complication of anesthesia    Memory loss 09/2015  . Coronary artery disease   . Depression   . GERD (gastroesophageal reflux disease)   . Glaucoma   . H/O thoracic aortic aneurysm repair   . Headache   . Heart murmur   . History of being hospitalized    memory lose kidney funtion down blood pressure up  . History of blood transfusion    "think he had one when he had heart valve OR" (10/14/2016); "none since" (10/19/2017)  . History of chicken pox   . Hyperlipidemia   . Hypertension   . Lumbar stenosis   . Murmur   . Neuropathy   . Osteoarthritis    worse in feet and ankles  . Paroxysmal A-fib ([place]   . Poor short term memory    takes Aricept  . Rheumatoid arthritis ([place]    "all over" (10/14/2016)  . Schizophrenia ([place]   . Seasonal allergies   . Thoracic spine pain   . Valvular heart disease   . Wears glasses    reading    Past Surgical History:  Procedure Laterality Date  .  AORTIC VALVE REPLACEMENT  2007   [facility].  [place], [place]; "pig valve"  . APPENDECTOMY  2010  . BACK SURGERY    . CARDIAC VALVE REPLACEMENT    . CATARACT EXTRACTION W/ INTRAOCULAR LENS  IMPLANT, BILATERAL Bilateral 2012  . COLONOSCOPY WITH PROPOFOL N/A 09/24/2016   Procedure: COLONOSCOPY WITH PROPOFOL;  Surgeon: Jonathon Bellows, MD;  Location: [facility] ENDOSCOPY;  Service: Endoscopy;  Laterality: N/A;  . ESOPHAGOGASTRODUODENOSCOPY (EGD) WITH PROPOFOL N/A 09/24/2016   Procedure: ESOPHAGOGASTRODUODENOSCOPY (EGD) WITH PROPOFOL;  Surgeon: Jonathon Bellows, MD;  Location: [facility] ENDOSCOPY;  Service: Endoscopy;  Laterality: N/A;  . GIVENS CAPSULE STUDY N/A  09/24/2016   Procedure: GIVENS CAPSULE STUDY;  Surgeon: Jonathon Bellows, MD;  Location: [facility] ENDOSCOPY;  Service: Endoscopy;  Laterality: N/A;  . HAMMER TOE SURGERY Right 10/09/2015   Procedure: HAMMER TOE REPAIR WITH K-WIRE FIXATION RIGHT SECOND TOE;  Surgeon: Albertine Patricia, DPM;  Location: [facility];  Service: Podiatry;  Laterality: Right;  WITH LOCAL  . INGUINAL HERNIA REPAIR Right 05/05/2018   Medium Bard PerFix plug.  Surgeon: Robert Bellow, MD;  Location: [facility] ORS;  Service: General;  Laterality: Right;  . IR VERTEBROPLASTY CERV/THOR BX INC UNI/BIL INC/INJECT/IMAGING  10/27/2018  . JOINT REPLACEMENT Left    left total knee  . LAMINECTOMY WITH POSTERIOR LATERAL ARTHRODESIS LEVEL 3 N/A 09/28/2017   Procedure: LUMBAR  POSTEROR  FUSION REVISION - LUMBAR ONE -TWO, LUMBAR TWO -THREE,THREE-FOUR ,BILATERAL ARTHRODESIS REMOVAL LUMBAR THREE HARDWARE;  Surgeon: Kary Kos, MD;  Location: [place];  Service: Neurosurgery;  Laterality: N/A;  . LAMINECTOMY WITH POSTERIOR LATERAL ARTHRODESIS LEVEL 3 N/A 10/20/2017   Procedure: Revision fusion and removal of hardware Lumbar one, Pedicle screw fixation thoracic ten-lumbar two, thoracic nine-ten laminotomy, Posterior Lumbar Arthrodesis thoracic ten-lumbar two ;  Surgeon: Kary Kos, MD;  Location: [place];  Service: Neurosurgery;  Laterality: N/A;  . LAPAROSCOPIC CHOLECYSTECTOMY  2010  . LUMBAR FUSION Right 06/08/2017   LUMBAR THREE-FOUR, LUMBAR FOUR-FIVE POSTEROLATERAL ARTHRODESIS WITH RIGHT LUMBAR FOUR-FIVE LAMINECTOMY/FORAMINOTOMY  . LUMBAR LAMINECTOMY/DECOMPRESSION MICRODISCECTOMY Right 03/08/2016   Procedure: Laminectomy and Foraminotomy - Lumbar Five-Sacral One Right;  Surgeon: Kary Kos, MD;  Location: [place];  Service: Neurosurgery;  Laterality: Right;  Right  . MULTIPLE TOOTH EXTRACTIONS     "3"  . POSTERIOR LUMBAR FUSION  10/14/2016   L5-S1  . REPLACEMENT TOTAL KNEE Left 2003  . THORACIC AORTIC ANEURYSM REPAIR  2010    MEDICATIONS: No current  facility-administered medications for this encounter.   Marland Kitchen amLODipine (NORVASC) 5 MG tablet  . apixaban (ELIQUIS) 5 MG TABS tablet  . aspirin EC 81 MG tablet  . atorvastatin (LIPITOR) 40 MG tablet  . calcium carbonate (OS-CAL - DOSED IN MG OF ELEMENTAL CALCIUM) 1250 (500 Ca) MG tablet  . Carboxymethylcellul-Glycerin (LUBRICATING EYE DROPS OP)  . cetirizine (ZYRTEC) 10 MG tablet  . diphenhydrAMINE (BENADRYL) 25 MG tablet  . docusate sodium (COLACE) 100 MG capsule  . fenofibrate 160 MG tablet  . FLUoxetine (PROZAC) 20 MG capsule  . folic acid (FOLVITE) 1 MG tablet  . gabapentin (NEURONTIN) 300 MG capsule  . HYDROcodone-acetaminophen (NORCO) 10-325 MG tablet  . hydrocortisone cream 1 %  . hydroxychloroquine (PLAQUENIL) 200 MG tablet  . lisinopril (ZESTRIL) 20 MG tablet  . Multiple Vitamins-Minerals (PRESERVISION AREDS 2) CAPS  . Omeprazole 20 MG TBEC  . testosterone cypionate (DEPOTESTOSTERONE CYPIONATE) 200 MG/ML injection  . vitamin B-12 (CYANOCOBALAMIN) 1000 MCG tablet  . vitamin E 180 MG (400 UNITS) capsule  .  omeprazole (PRILOSEC) 20 MG capsule    Myra Gianotti, PA-C Surgical Short Stay/Anesthesiology [facility] Phone [phone] [facility] Phone [phone] 11/01/2019 4:03 PM

## 2019-11-01 NOTE — Progress Notes (Signed)
Pt denies SOB and chest pain. Pt stated that he is under the care of Dr. [name], Cardiology and Dr. [name], PCP. Pt denies having a stress test and cardiac cath. Pt denies having a chest x ray in the last year. Pt denies recent labs. Pt stated that last dose of Aspirin was Saturday 10/27/19 and last dose of Eliquis was Monday, 10/29/19 as instructed by MD. Pt made aware to stop taking  vitamins, fish oil and herbal medications. Do not take any NSAIDs ie: Ibuprofen, Advil, Naproxen (Aleve), Motrin, BC and Goody Powder. Pt reminded to quarantine. Pt verbalized understanding of all pre-op instructions. PA, Anesthesiology, asked to review pt history.

## 2019-11-02 ENCOUNTER — Ambulatory Visit (HOSPITAL_COMMUNITY)
Admission: RE | Admit: 2019-11-02 | Discharge: 2019-11-03 | Disposition: A | Discharge status: Home / Self Care | Payer: Medicare Other | Attending: Neurosurgery | Admitting: Neurosurgery

## 2019-11-02 ENCOUNTER — Other Ambulatory Visit: Payer: Self-pay

## 2019-11-02 ENCOUNTER — Encounter (HOSPITAL_COMMUNITY): Payer: Self-pay | Admitting: Neurosurgery

## 2019-11-02 ENCOUNTER — Encounter (HOSPITAL_COMMUNITY)
Admission: RE | Disposition: A | Discharge status: Home / Self Care | Payer: Self-pay | Source: Home / Self Care | Attending: Neurosurgery

## 2019-11-02 ENCOUNTER — Ambulatory Visit (HOSPITAL_COMMUNITY): Payer: Medicare Other | Admitting: Vascular Surgery

## 2019-11-02 DIAGNOSIS — Z79899 Other long term (current) drug therapy: Secondary | ICD-10-CM | POA: Diagnosis not present

## 2019-11-02 DIAGNOSIS — Z981 Arthrodesis status: Secondary | ICD-10-CM | POA: Insufficient documentation

## 2019-11-02 DIAGNOSIS — T84226A Displacement of internal fixation device of vertebrae, initial encounter: Secondary | ICD-10-CM | POA: Insufficient documentation

## 2019-11-02 DIAGNOSIS — F329 Major depressive disorder, single episode, unspecified: Secondary | ICD-10-CM | POA: Diagnosis not present

## 2019-11-02 DIAGNOSIS — Z96651 Presence of right artificial knee joint: Secondary | ICD-10-CM | POA: Insufficient documentation

## 2019-11-02 DIAGNOSIS — I480 Paroxysmal atrial fibrillation: Secondary | ICD-10-CM | POA: Diagnosis not present

## 2019-11-02 DIAGNOSIS — Z7982 Long term (current) use of aspirin: Secondary | ICD-10-CM | POA: Diagnosis not present

## 2019-11-02 DIAGNOSIS — I712 Thoracic aortic aneurysm, without rupture: Secondary | ICD-10-CM | POA: Diagnosis not present

## 2019-11-02 DIAGNOSIS — M069 Rheumatoid arthritis, unspecified: Secondary | ICD-10-CM | POA: Insufficient documentation

## 2019-11-02 DIAGNOSIS — T8484XA Pain due to internal orthopedic prosthetic devices, implants and grafts, initial encounter: Secondary | ICD-10-CM | POA: Diagnosis present

## 2019-11-02 DIAGNOSIS — Z952 Presence of prosthetic heart valve: Secondary | ICD-10-CM | POA: Diagnosis not present

## 2019-11-02 DIAGNOSIS — Y831 Surgical operation with implant of artificial internal device as the cause of abnormal reaction of the patient, or of later complication, without mention of misadventure at the time of the procedure: Secondary | ICD-10-CM | POA: Diagnosis not present

## 2019-11-02 DIAGNOSIS — I2510 Atherosclerotic heart disease of native coronary artery without angina pectoris: Secondary | ICD-10-CM | POA: Diagnosis not present

## 2019-11-02 DIAGNOSIS — F419 Anxiety disorder, unspecified: Secondary | ICD-10-CM | POA: Insufficient documentation

## 2019-11-02 DIAGNOSIS — G629 Polyneuropathy, unspecified: Secondary | ICD-10-CM | POA: Insufficient documentation

## 2019-11-02 DIAGNOSIS — E785 Hyperlipidemia, unspecified: Secondary | ICD-10-CM | POA: Diagnosis not present

## 2019-11-02 DIAGNOSIS — M5116 Intervertebral disc disorders with radiculopathy, lumbar region: Secondary | ICD-10-CM | POA: Diagnosis not present

## 2019-11-02 DIAGNOSIS — I10 Essential (primary) hypertension: Secondary | ICD-10-CM | POA: Diagnosis not present

## 2019-11-02 DIAGNOSIS — J302 Other seasonal allergic rhinitis: Secondary | ICD-10-CM | POA: Diagnosis not present

## 2019-11-02 DIAGNOSIS — Z79891 Long term (current) use of opiate analgesic: Secondary | ICD-10-CM | POA: Insufficient documentation

## 2019-11-02 DIAGNOSIS — Z7901 Long term (current) use of anticoagulants: Secondary | ICD-10-CM | POA: Diagnosis not present

## 2019-11-02 DIAGNOSIS — Z79890 Hormone replacement therapy (postmenopausal): Secondary | ICD-10-CM | POA: Diagnosis not present

## 2019-11-02 DIAGNOSIS — R413 Other amnesia: Secondary | ICD-10-CM | POA: Insufficient documentation

## 2019-11-02 DIAGNOSIS — Z87891 Personal history of nicotine dependence: Secondary | ICD-10-CM | POA: Insufficient documentation

## 2019-11-02 HISTORY — PX: HARDWARE REMOVAL: SHX979

## 2019-11-02 HISTORY — DX: Pain in thoracic spine: M54.6

## 2019-11-02 HISTORY — DX: Presence of spectacles and contact lenses: Z97.3

## 2019-11-02 HISTORY — DX: Headache, unspecified: R51.9

## 2019-11-02 LAB — BASIC METABOLIC PANEL
Anion gap: 9 (ref 5–15)
BUN: 14 mg/dL (ref 8–23)
CO2: 23 mmol/L (ref 22–32)
Calcium: 9.2 mg/dL (ref 8.9–10.3)
Chloride: 106 mmol/L (ref 98–111)
Creatinine, Ser: 0.96 mg/dL (ref 0.61–1.24)
GFR calc Af Amer: >60 mL/min (ref >60)
GFR calc non Af Amer: >60 mL/min (ref >60)
Glucose, Bld: 81 mg/dL (ref 70–99)
Potassium: 3.8 mmol/L (ref 3.5–5.1)
Sodium: 138 mmol/L (ref 135–145)

## 2019-11-02 LAB — CBC
HCT: 35.1 % — ABNORMAL LOW (ref 39.0–52.0)
Hemoglobin: 11.1 g/dL — ABNORMAL LOW (ref 13.0–17.0)
MCH: 31.7 pg (ref 26.0–34.0)
MCHC: 31.6 g/dL (ref 30.0–36.0)
MCV: 100.3 fL — ABNORMAL HIGH (ref 80.0–100.0)
Platelets: 113 10*3/uL — ABNORMAL LOW (ref 150–400)
RBC: 3.5 MIL/uL — ABNORMAL LOW (ref 4.22–5.81)
RDW: 14.1 % (ref 11.5–15.5)
WBC: 3.5 10*3/uL — ABNORMAL LOW (ref 4.0–10.5)
nRBC: 0 % (ref 0.0–0.2)

## 2019-11-02 SURGERY — REMOVAL, HARDWARE
Anesthesia: General | Site: Spine Thoracic

## 2019-11-02 MED ORDER — SUGAMMADEX SODIUM 200 MG/2ML IV SOLN
INTRAVENOUS | Status: DC | PRN
  Administered 2019-11-02: 140 mg via INTRAVENOUS

## 2019-11-02 MED ORDER — PANTOPRAZOLE SODIUM 40 MG PO TBEC: 40.0000 mg | DELAYED_RELEASE_TABLET | Freq: Every day | ORAL | Status: DC

## 2019-11-02 MED ORDER — PANTOPRAZOLE SODIUM 40 MG IV SOLR: 40.0000 mg | Freq: Every day | INTRAVENOUS | Status: DC

## 2019-11-02 MED ORDER — SODIUM CHLORIDE 0.9% FLUSH: 3.0000 mL | Freq: Two times a day (BID) | INTRAVENOUS | Status: DC

## 2019-11-02 MED ORDER — TESTOSTERONE CYPIONATE 200 MG/ML IM SOLN
100.0000 mg | INTRAMUSCULAR | Status: DC
  Filled 2019-11-02: qty 0.5

## 2019-11-02 MED ORDER — LISINOPRIL 20 MG PO TABS: 20.0000 mg | ORAL_TABLET | Freq: Every day | ORAL | Status: DC

## 2019-11-02 MED ORDER — FLUOXETINE HCL 20 MG PO CAPS: 20.0000 mg | ORAL_CAPSULE | Freq: Every day | ORAL | Status: DC

## 2019-11-02 MED ORDER — SODIUM CHLORIDE 0.9 % IV SOLN: 250.0000 mL | INTRAVENOUS | Status: DC

## 2019-11-02 MED ORDER — AMLODIPINE BESYLATE 5 MG PO TABS: 5.0000 mg | ORAL_TABLET | Freq: Every day | ORAL | Status: DC

## 2019-11-02 MED ORDER — ONDANSETRON HCL 4 MG/2ML IJ SOLN: 4.0000 mg | Freq: Four times a day (QID) | INTRAMUSCULAR | Status: DC | PRN

## 2019-11-02 MED ORDER — BUPIVACAINE HCL (PF) 0.25 % IJ SOLN
INTRAMUSCULAR | Status: AC
  Filled 2019-11-02: qty 30

## 2019-11-02 MED ORDER — FENTANYL CITRATE (PF) 100 MCG/2ML IJ SOLN
25.0000 ug | INTRAMUSCULAR | Status: DC | PRN
  Administered 2019-11-02: 25 ug via INTRAVENOUS

## 2019-11-02 MED ORDER — FENOFIBRATE 160 MG PO TABS: 160.0000 mg | ORAL_TABLET | Freq: Every day | ORAL | Status: DC

## 2019-11-02 MED ORDER — DIPHENHYDRAMINE HCL 25 MG PO TABS
25.0000 mg | ORAL_TABLET | Freq: Every evening | ORAL | Status: DC | PRN
  Filled 2019-11-02: qty 2

## 2019-11-02 MED ORDER — LORATADINE 10 MG PO TABS: 10.0000 mg | ORAL_TABLET | Freq: Every day | ORAL | Status: DC

## 2019-11-02 MED ORDER — CHLORHEXIDINE GLUCONATE 0.12 % MT SOLN: 15.0000 mL | Freq: Once | OROMUCOSAL | Status: AC

## 2019-11-02 MED ORDER — HYDROCORTISONE 1 % EX CREA
1.0000 "application " | TOPICAL_CREAM | Freq: Every day | CUTANEOUS | Status: DC | PRN
  Filled 2019-11-02: qty 28

## 2019-11-02 MED ORDER — FENTANYL CITRATE (PF) 250 MCG/5ML IJ SOLN
INTRAMUSCULAR | Status: AC
  Filled 2019-11-02: qty 5

## 2019-11-02 MED ORDER — VANCOMYCIN HCL IN DEXTROSE 1-5 GM/200ML-% IV SOLN: 1000.0000 mg | INTRAVENOUS | Status: AC

## 2019-11-02 MED ORDER — PROPOFOL 500 MG/50ML IV EMUL
INTRAVENOUS | Status: DC | PRN
  Administered 2019-11-02: 100 ug/kg/min via INTRAVENOUS

## 2019-11-02 MED ORDER — CARBOXYMETHYLCELLUL-GLYCERIN 0.5-0.9 % OP SOLN: 2.0000 [drp] | Freq: Every day | OPHTHALMIC | Status: DC | PRN

## 2019-11-02 MED ORDER — VITAMIN B-12 1000 MCG PO TABS: 1000.0000 ug | ORAL_TABLET | Freq: Every day | ORAL | Status: DC

## 2019-11-02 MED ORDER — LIDOCAINE 2% (20 MG/ML) 5 ML SYRINGE
INTRAMUSCULAR | Status: DC | PRN
  Administered 2019-11-02: 60 mg via INTRAVENOUS

## 2019-11-02 MED ORDER — SODIUM CHLORIDE 0.9% FLUSH
3.0000 mL | INTRAVENOUS | Status: DC | PRN
  Administered 2019-11-02: 3 mL via INTRAVENOUS

## 2019-11-02 MED ORDER — PHENYLEPHRINE HCL-NACL 10-0.9 MG/250ML-% IV SOLN
INTRAVENOUS | Status: DC | PRN
  Administered 2019-11-02: 50 ug/min via INTRAVENOUS

## 2019-11-02 MED ORDER — SODIUM CHLORIDE 0.9 % IV SOLN
INTRAVENOUS | Status: DC | PRN
  Administered 2019-11-02: 500 mL

## 2019-11-02 MED ORDER — PROSIGHT PO TABS
1.0000 | ORAL_TABLET | Freq: Every day | ORAL | Status: DC
  Filled 2019-11-02 (×2): qty 1

## 2019-11-02 MED ORDER — ORAL CARE MOUTH RINSE: 15.0000 mL | Freq: Once | OROMUCOSAL | Status: AC

## 2019-11-02 MED ORDER — ONDANSETRON HCL 4 MG PO TABS: 4.0000 mg | ORAL_TABLET | Freq: Four times a day (QID) | ORAL | Status: DC | PRN

## 2019-11-02 MED ORDER — PROPOFOL 10 MG/ML IV BOLUS
INTRAVENOUS | Status: DC | PRN
  Administered 2019-11-02: 50 mg via INTRAVENOUS
  Administered 2019-11-02: 100 mg via INTRAVENOUS

## 2019-11-02 MED ORDER — CALCIUM CARBONATE 1250 (500 CA) MG PO TABS
1.0000 | ORAL_TABLET | Freq: Every day | ORAL | Status: DC
  Filled 2019-11-02: qty 1

## 2019-11-02 MED ORDER — EPHEDRINE SULFATE 50 MG/ML IJ SOLN
INTRAMUSCULAR | Status: DC | PRN
  Administered 2019-11-02: 15 mg via INTRAVENOUS

## 2019-11-02 MED ORDER — CHLORHEXIDINE GLUCONATE CLOTH 2 % EX PADS: 6.0000 | MEDICATED_PAD | Freq: Once | CUTANEOUS | Status: DC

## 2019-11-02 MED ORDER — FOLIC ACID 1 MG PO TABS: 1.0000 mg | ORAL_TABLET | Freq: Every day | ORAL | Status: DC

## 2019-11-02 MED ORDER — DOCUSATE SODIUM 100 MG PO CAPS: 200.0000 mg | ORAL_CAPSULE | Freq: Every day | ORAL | Status: DC

## 2019-11-02 MED ORDER — HYDROMORPHONE HCL 1 MG/ML IJ SOLN: 0.5000 mg | INTRAMUSCULAR | Status: DC | PRN

## 2019-11-02 MED ORDER — ATORVASTATIN CALCIUM 40 MG PO TABS: 40.0000 mg | ORAL_TABLET | Freq: Every day | ORAL | Status: DC

## 2019-11-02 MED ORDER — ONDANSETRON HCL 4 MG/2ML IJ SOLN
INTRAMUSCULAR | Status: DC | PRN
  Administered 2019-11-02: 4 mg via INTRAVENOUS

## 2019-11-02 MED ORDER — VITAMIN E 180 MG (400 UNIT) PO CAPS
800.0000 [IU] | ORAL_CAPSULE | Freq: Every day | ORAL | Status: DC
  Filled 2019-11-02: qty 2

## 2019-11-02 MED ORDER — PROPOFOL 1000 MG/100ML IV EMUL
INTRAVENOUS | Status: AC
  Filled 2019-11-02: qty 100

## 2019-11-02 MED ORDER — PHENOL 1.4 % MT LIQD: 1.0000 | OROMUCOSAL | Status: DC | PRN

## 2019-11-02 MED ORDER — DEXAMETHASONE SODIUM PHOSPHATE 10 MG/ML IJ SOLN
INTRAMUSCULAR | Status: DC | PRN
  Administered 2019-11-02: 10 mg via INTRAVENOUS

## 2019-11-02 MED ORDER — HYDROCODONE-ACETAMINOPHEN 10-325 MG PO TABS
1.0000 | ORAL_TABLET | Freq: Four times a day (QID) | ORAL | Status: DC | PRN
  Administered 2019-11-02 – 2019-11-03 (×2): 1 via ORAL
  Filled 2019-11-02 (×2): qty 1

## 2019-11-02 MED ORDER — FENTANYL CITRATE (PF) 100 MCG/2ML IJ SOLN
INTRAMUSCULAR | Status: AC
  Filled 2019-11-02: qty 2

## 2019-11-02 MED ORDER — ONDANSETRON HCL 4 MG/2ML IJ SOLN: 4.0000 mg | Freq: Once | INTRAMUSCULAR | Status: DC | PRN

## 2019-11-02 MED ORDER — CYCLOBENZAPRINE HCL 10 MG PO TABS
ORAL_TABLET | ORAL | Status: AC
  Filled 2019-11-02: qty 1

## 2019-11-02 MED ORDER — POLYVINYL ALCOHOL 1.4 % OP SOLN
1.0000 [drp] | OPHTHALMIC | Status: DC | PRN
  Filled 2019-11-02: qty 15

## 2019-11-02 MED ORDER — GABAPENTIN 300 MG PO CAPS: 300.0000 mg | ORAL_CAPSULE | Freq: Two times a day (BID) | ORAL | Status: DC

## 2019-11-02 MED ORDER — OXYCODONE HCL 5 MG/5ML PO SOLN: 5.0000 mg | Freq: Once | ORAL | Status: AC | PRN

## 2019-11-02 MED ORDER — CYCLOBENZAPRINE HCL 10 MG PO TABS
10.0000 mg | ORAL_TABLET | Freq: Three times a day (TID) | ORAL | Status: DC | PRN
  Administered 2019-11-02 (×2): 10 mg via ORAL
  Filled 2019-11-02: qty 1

## 2019-11-02 MED ORDER — OXYCODONE HCL 5 MG PO TABS
ORAL_TABLET | ORAL | Status: AC
  Filled 2019-11-02: qty 1

## 2019-11-02 MED ORDER — ALUM & MAG HYDROXIDE-SIMETH 200-200-20 MG/5ML PO SUSP: 30.0000 mL | Freq: Four times a day (QID) | ORAL | Status: DC | PRN

## 2019-11-02 MED ORDER — LIDOCAINE-EPINEPHRINE 1 %-1:100000 IJ SOLN
INTRAMUSCULAR | Status: AC
  Filled 2019-11-02: qty 1

## 2019-11-02 MED ORDER — PROPOFOL 10 MG/ML IV BOLUS
INTRAVENOUS | Status: AC
  Filled 2019-11-02: qty 20

## 2019-11-02 MED ORDER — CHLORHEXIDINE GLUCONATE 0.12 % MT SOLN
OROMUCOSAL | Status: AC
  Administered 2019-11-02: 15 mL via OROMUCOSAL
  Filled 2019-11-02: qty 15

## 2019-11-02 MED ORDER — VANCOMYCIN HCL IN DEXTROSE 1-5 GM/200ML-% IV SOLN
1000.0000 mg | Freq: Once | INTRAVENOUS | Status: AC
  Administered 2019-11-02: 1000 mg via INTRAVENOUS
  Filled 2019-11-02: qty 200

## 2019-11-02 MED ORDER — ROCURONIUM BROMIDE 10 MG/ML (PF) SYRINGE
PREFILLED_SYRINGE | INTRAVENOUS | Status: DC | PRN
  Administered 2019-11-02: 50 mg via INTRAVENOUS

## 2019-11-02 MED ORDER — HYDROXYCHLOROQUINE SULFATE 200 MG PO TABS
200.0000 mg | ORAL_TABLET | ORAL | Status: DC
  Filled 2019-11-02: qty 1

## 2019-11-02 MED ORDER — DEXAMETHASONE SODIUM PHOSPHATE 10 MG/ML IJ SOLN: 10.0000 mg | Freq: Once | INTRAMUSCULAR | Status: DC

## 2019-11-02 MED ORDER — PRESERVISION AREDS 2 PO CAPS: 1.0000 | ORAL_CAPSULE | Freq: Two times a day (BID) | ORAL | Status: DC

## 2019-11-02 MED ORDER — LIDOCAINE-EPINEPHRINE 1 %-1:100000 IJ SOLN
INTRAMUSCULAR | Status: DC | PRN
  Administered 2019-11-02: 10 mL

## 2019-11-02 MED ORDER — THROMBIN 5000 UNITS EX SOLR
CUTANEOUS | Status: AC
  Filled 2019-11-02: qty 10000

## 2019-11-02 MED ORDER — ACETAMINOPHEN 650 MG RE SUPP: 650.0000 mg | RECTAL | Status: DC | PRN

## 2019-11-02 MED ORDER — OXYCODONE HCL 5 MG PO TABS
5.0000 mg | ORAL_TABLET | Freq: Once | ORAL | Status: AC | PRN
  Administered 2019-11-02: 5 mg via ORAL

## 2019-11-02 MED ORDER — VANCOMYCIN HCL IN DEXTROSE 1-5 GM/200ML-% IV SOLN
INTRAVENOUS | Status: AC
  Administered 2019-11-02: 1000 mg via INTRAVENOUS
  Filled 2019-11-02: qty 200

## 2019-11-02 MED ORDER — ACETAMINOPHEN 325 MG PO TABS: 650.0000 mg | ORAL_TABLET | ORAL | Status: DC | PRN

## 2019-11-02 MED ORDER — ASPIRIN EC 81 MG PO TBEC: 81.0000 mg | DELAYED_RELEASE_TABLET | Freq: Every day | ORAL | Status: DC

## 2019-11-02 MED ORDER — CEFAZOLIN SODIUM-DEXTROSE 2-4 GM/100ML-% IV SOLN: 2.0000 g | Freq: Three times a day (TID) | INTRAVENOUS | Status: DC

## 2019-11-02 MED ORDER — FENTANYL CITRATE (PF) 100 MCG/2ML IJ SOLN
INTRAMUSCULAR | Status: DC | PRN
  Administered 2019-11-02: 75 ug via INTRAVENOUS
  Administered 2019-11-02: 50 ug via INTRAVENOUS

## 2019-11-02 MED ORDER — ONDANSETRON HCL 4 MG/2ML IJ SOLN
INTRAMUSCULAR | Status: AC
  Filled 2019-11-02: qty 2

## 2019-11-02 MED ORDER — 0.9 % SODIUM CHLORIDE (POUR BTL) OPTIME
TOPICAL | Status: DC | PRN
  Administered 2019-11-02: 1000 mL

## 2019-11-02 MED ORDER — MENTHOL 3 MG MT LOZG: 1.0000 | LOZENGE | OROMUCOSAL | Status: DC | PRN

## 2019-11-02 MED ORDER — LACTATED RINGERS IV SOLN: INTRAVENOUS | Status: DC | PRN

## 2019-11-02 MED ORDER — OXYCODONE HCL 5 MG PO TABS: 10.0000 mg | ORAL_TABLET | ORAL | Status: DC | PRN

## 2019-11-02 SURGICAL SUPPLY — 53 items
BAG DECANTER FOR FLEXI CONT (MISCELLANEOUS) ×2 IMPLANT
BENZOIN TINCTURE PRP APPL 2/3 (GAUZE/BANDAGES/DRESSINGS) ×2 IMPLANT
BLADE CLIPPER SURG (BLADE) IMPLANT
BUR MATCHSTICK NEURO 3.0 LAGG (BURR) IMPLANT
BUR PRECISION FLUTE 6.0 (BURR) ×2 IMPLANT
CANISTER SUCT 3000ML PPV (MISCELLANEOUS) ×2 IMPLANT
CARTRIDGE OIL MAESTRO DRILL (MISCELLANEOUS) ×1 IMPLANT
COVER WAND RF STERILE (DRAPES) IMPLANT
DECANTER SPIKE VIAL GLASS SM (MISCELLANEOUS) ×2 IMPLANT
DERMABOND ADVANCED (GAUZE/BANDAGES/DRESSINGS) ×1
DERMABOND ADVANCED .7 DNX12 (GAUZE/BANDAGES/DRESSINGS) ×1 IMPLANT
DIFFUSER DRILL AIR PNEUMATIC (MISCELLANEOUS) ×2 IMPLANT
DRAPE HALF SHEET 40X57 (DRAPES) IMPLANT
DRAPE LAPAROTOMY 100X72X124 (DRAPES) ×2 IMPLANT
DRAPE SURG 17X23 STRL (DRAPES) ×2 IMPLANT
DRSG OPSITE POSTOP 4X6 (GAUZE/BANDAGES/DRESSINGS) ×2 IMPLANT
ELECT REM PT RETURN 9FT ADLT (ELECTROSURGICAL) ×2
ELECTRODE REM PT RTRN 9FT ADLT (ELECTROSURGICAL) ×1 IMPLANT
GAUZE 4X4 16PLY RFD (DISPOSABLE) IMPLANT
GAUZE SPONGE 4X4 12PLY STRL (GAUZE/BANDAGES/DRESSINGS) IMPLANT
GLOVE BIO SURGEON STRL SZ7 (GLOVE) IMPLANT
GLOVE BIO SURGEON STRL SZ7.5 (GLOVE) ×2 IMPLANT
GLOVE BIO SURGEON STRL SZ8 (GLOVE) ×2 IMPLANT
GLOVE BIOGEL PI IND STRL 6.5 (GLOVE) ×1 IMPLANT
GLOVE BIOGEL PI IND STRL 7.0 (GLOVE) IMPLANT
GLOVE BIOGEL PI INDICATOR 6.5 (GLOVE) ×1
GLOVE BIOGEL PI INDICATOR 7.0 (GLOVE)
GLOVE ECLIPSE 7.0 STRL STRAW (GLOVE) IMPLANT
GLOVE EXAM NITRILE XL STR (GLOVE) IMPLANT
GLOVE INDICATOR 8.5 STRL (GLOVE) ×2 IMPLANT
GLOVE SURG SS PI 6.0 STRL IVOR (GLOVE) ×4 IMPLANT
GOWN STRL REUS W/ TWL LRG LVL3 (GOWN DISPOSABLE) ×2 IMPLANT
GOWN STRL REUS W/ TWL XL LVL3 (GOWN DISPOSABLE) ×1 IMPLANT
GOWN STRL REUS W/TWL 2XL LVL3 (GOWN DISPOSABLE) IMPLANT
GOWN STRL REUS W/TWL LRG LVL3 (GOWN DISPOSABLE) ×2
GOWN STRL REUS W/TWL XL LVL3 (GOWN DISPOSABLE) ×1
KIT BASIN OR (CUSTOM PROCEDURE TRAY) ×2 IMPLANT
KIT TURNOVER KIT B (KITS) ×2 IMPLANT
NEEDLE HYPO 22GX1.5 SAFETY (NEEDLE) ×2 IMPLANT
NEEDLE SPNL 22GX3.5 QUINCKE BK (NEEDLE) IMPLANT
NS IRRIG 1000ML POUR BTL (IV SOLUTION) ×2 IMPLANT
OIL CARTRIDGE MAESTRO DRILL (MISCELLANEOUS) ×2
PACK LAMINECTOMY NEURO (CUSTOM PROCEDURE TRAY) ×2 IMPLANT
RASP 3.0MM (RASP) ×2 IMPLANT
SPONGE SURGIFOAM ABS GEL SZ50 (HEMOSTASIS) IMPLANT
STRIP CLOSURE SKIN 1/2X4 (GAUZE/BANDAGES/DRESSINGS) ×2 IMPLANT
SUT VIC AB 0 CT1 18XCR BRD8 (SUTURE) ×1 IMPLANT
SUT VIC AB 0 CT1 8-18 (SUTURE) ×1
SUT VIC AB 2-0 CT1 18 (SUTURE) ×4 IMPLANT
SUT VICRYL 4-0 PS2 18IN ABS (SUTURE) ×2 IMPLANT
TOWEL GREEN STERILE (TOWEL DISPOSABLE) ×2 IMPLANT
TOWEL GREEN STERILE FF (TOWEL DISPOSABLE) ×2 IMPLANT
WATER STERILE IRR 1000ML POUR (IV SOLUTION) ×2 IMPLANT

## 2019-11-02 NOTE — Anesthesia Postprocedure Evaluation (Signed)
Anesthesia Post Note  Patient: Bryan [name]  Procedure(s) Performed: Removal of hardware thoracic spine (N/A Spine Thoracic)     Patient location during evaluation: PACU Anesthesia Type: General Level of consciousness: awake and alert Pain management: pain level controlled Vital Signs Assessment: post-procedure vital signs reviewed and stable Respiratory status: spontaneous breathing, nonlabored ventilation and respiratory function stable Cardiovascular status: blood pressure returned to baseline and stable Postop Assessment: no apparent nausea or vomiting Anesthetic complications: no   No complications documented.  Last Vitals:  Vitals:   11/02/19 1507 11/02/19 1522  BP: (!) 162/78 (!) 157/85  Pulse: 63 68  Resp: (!) 24 18  Temp:    SpO2: 100% 99%    Last Pain:  Vitals:   11/02/19 1522  TempSrc:   PainSc: [place]

## 2019-11-02 NOTE — Anesthesia Procedure Notes (Signed)
Procedure Name: Intubation Date/Time: 11/02/2019 1:53 PM Performed by: Imagene Riches, CRNA Pre-anesthesia Checklist: Patient identified, Emergency Drugs available, Suction available and Patient being monitored Patient Re-evaluated:Patient Re-evaluated prior to induction Oxygen Delivery Method: Circle System Utilized Preoxygenation: Pre-oxygenation with 100% oxygen Induction Type: IV induction Ventilation: Mask ventilation without difficulty Laryngoscope Size: Mac and 3 Grade View: Grade I Tube type: Oral Tube size: 7.5 mm Number of attempts: 1 Airway Equipment and Method: Stylet and Oral airway Placement Confirmation: ETT inserted through vocal cords under direct vision,  positive ETCO2 and breath sounds checked- equal and bilateral Secured at: 23 cm Tube secured with: Tape Dental Injury: Teeth and Oropharynx as per pre-operative assessment

## 2019-11-02 NOTE — Op Note (Signed)
Preoperative diagnosis: Painful hardware  Postoperative diagnosis: Same  Procedure: Exploration of fusion removal of hardware with cutting the rod with a metal cutting drill bit below the second screw on the right inner up in his upper thoracic spine and below the first screw on the left with removal of 2 screws in the right one in the left at the top of his construct in his thoracic spine.  Surgeon: Dominica Severin Adlynn Lowenstein  Anesthesia: General  EBL: 50  HPI: Patient is an 79 year old gentleman previously undergone a thoracolumbar fusion developed aggressive worsening back pain at the level of the top of his construct.  Work-up revealed loosening of his top 2 screws as well as his second screw on the right and due to his progression of clinical syndrome imaging findings and failure of conservative treatment I recommended removal of the top 3 screws in his thoracic spine to the right one in the left I extensively went over the risks and benefits of that operation with him as well as perioperative course expectations of outcome and alternatives of surgery and he understood and agreed to proceed forward.  Operative procedure: Patient was brought into the OR was changed on general anesthesia positioned prone the Wilson frame his back was prepped and draped in routine sterile fashion preoperative x-ray localized the appropriate level so after infiltration of 10 cc lidocaine with epi a midline incision was made over the superior aspect of his incision I dissected and identified the hardware on both sides and dissected out the hardware I cut the rod with a metal cutting drill bit between the second and third screw on the right and the first and second screw on the left all 3 of those screws were markedly loose and were easily removed I inspected the rest of the construct which felt solid and his fusion appeared to be solid at those levels.  Wound was then copiously irrigated meticulous hemostasis was maintained and the  wound was then closed in layers with interrupted Vicryl and running 4 subcuticular Dermabond benzoin Steri-Strips and a sterile dressing was applied patient recovery in stable condition.  At the end of the case all needle counts and sponge counts were correct.

## 2019-11-02 NOTE — Transfer of Care (Signed)
Immediate Anesthesia Transfer of Care Note  Patient: Bryan [name]  Procedure(s) Performed: Removal of hardware thoracic spine (N/A Spine Thoracic)  Patient Location: PACU  Anesthesia Type:General  Level of Consciousness: awake and alert   Airway & Oxygen Therapy: Patient Spontanous Breathing and Patient connected to nasal cannula oxygen  Post-op Assessment: Report given to RN and Post -op Vital signs reviewed and stable  Post vital signs: Reviewed and stable  Last Vitals:  Vitals Value Taken Time  BP 150/94 11/02/19 1452  Temp 36.7 C 11/02/19 1452  Pulse 65 11/02/19 1453  Resp 30 11/02/19 1453  SpO2 100 % 11/02/19 1453  Vitals shown include unvalidated device data.  Last Pain:  Vitals:   11/02/19 1056  TempSrc:   PainSc: 4       Patients Stated Pain Goal: 3 (11/46/43 1427)  Complications: No complications documented.

## 2019-11-02 NOTE — H&P (Signed)
Bryan [name] is an 79 y.o. male.   Chief Complaint: Back pain HPI: 79 year old gentleman with progressive worsening back pain work-up is revealed loose screws at the top of his thoracic construct as well as 1 loose screw on the right so due to his longstanding osteopenia I have not recommended extending his fusion due to high risk of failure in addition he is got significant evidence of fusion below this that do not think these areas are inherently unstable either so I recommended removal of hardware without augmentation or addition.  So plan to cut the rod between the lower 2 and 3 screws on the right and between the lower 1 and 2 screws on the left remove 3 loose screws and then closed up.  I have extensively gone over the risks and benefits of that procedure with him as well as perioperative course expectations of outcome and alternatives of surgery and he understands and agrees to proceed forward  Past Medical History:  Diagnosis Date  . Abnormal CT scan    Asymmetric left rectal wall thickening   . Anemia   . Anxiety   . Arrhythmia   . Chronic lower back pain   . Complication of anesthesia    Memory loss 09/2015  . Coronary artery disease   . Depression   . GERD (gastroesophageal reflux disease)   . Glaucoma   . H/O thoracic aortic aneurysm repair   . Headache   . Heart murmur   . History of being hospitalized    memory lose kidney funtion down blood pressure up  . History of blood transfusion    "think he had one when he had heart valve OR" (10/14/2016); "none since" (10/19/2017)  . History of chicken pox   . Hyperlipidemia   . Hypertension   . Lumbar stenosis   . Murmur   . Neuropathy   . Osteoarthritis    worse in feet and ankles  . Paroxysmal A-fib ([place])   . Poor short term memory    takes Aricept  . Rheumatoid arthritis ([place])    "all over" (10/14/2016)  . Schizophrenia ([place])   . Seasonal allergies   . Thoracic spine pain   . Valvular heart disease   . Wears  glasses    reading    Past Surgical History:  Procedure Laterality Date  . AORTIC VALVE REPLACEMENT  2007   [facility].  [place], [place]; "pig valve"  . APPENDECTOMY  2010  . BACK SURGERY    . CARDIAC VALVE REPLACEMENT    . CATARACT EXTRACTION W/ INTRAOCULAR LENS  IMPLANT, BILATERAL Bilateral 2012  . COLONOSCOPY WITH PROPOFOL N/A 09/24/2016   Procedure: COLONOSCOPY WITH PROPOFOL;  Surgeon: Jonathon Bellows, MD;  Location: [facility] ENDOSCOPY;  Service: Endoscopy;  Laterality: N/A;  . ESOPHAGOGASTRODUODENOSCOPY (EGD) WITH PROPOFOL N/A 09/24/2016   Procedure: ESOPHAGOGASTRODUODENOSCOPY (EGD) WITH PROPOFOL;  Surgeon: Jonathon Bellows, MD;  Location: [facility] ENDOSCOPY;  Service: Endoscopy;  Laterality: N/A;  . GIVENS CAPSULE STUDY N/A 09/24/2016   Procedure: GIVENS CAPSULE STUDY;  Surgeon: Jonathon Bellows, MD;  Location: [facility] ENDOSCOPY;  Service: Endoscopy;  Laterality: N/A;  . HAMMER TOE SURGERY Right 10/09/2015   Procedure: HAMMER TOE REPAIR WITH K-WIRE FIXATION RIGHT SECOND TOE;  Surgeon: Albertine Patricia, DPM;  Location: [place];  Service: Podiatry;  Laterality: Right;  WITH LOCAL  . INGUINAL HERNIA REPAIR Right 05/05/2018   Medium Bard PerFix plug.  Surgeon: Robert Bellow, MD;  Location: [facility] ORS;  Service: General;  Laterality:  Right;  . IR VERTEBROPLASTY CERV/THOR BX INC UNI/BIL INC/INJECT/IMAGING  10/27/2018  . JOINT REPLACEMENT Left    left total knee  . LAMINECTOMY WITH POSTERIOR LATERAL ARTHRODESIS LEVEL 3 N/A 09/28/2017   Procedure: LUMBAR  POSTEROR  FUSION REVISION - LUMBAR ONE -TWO, LUMBAR TWO -THREE,THREE-FOUR ,BILATERAL ARTHRODESIS REMOVAL LUMBAR THREE HARDWARE;  Surgeon: Kary Kos, MD;  Location: [place];  Service: Neurosurgery;  Laterality: N/A;  . LAMINECTOMY WITH POSTERIOR LATERAL ARTHRODESIS LEVEL 3 N/A 10/20/2017   Procedure: Revision fusion and removal of hardware Lumbar one, Pedicle screw fixation thoracic ten-lumbar two, thoracic nine-ten laminotomy, Posterior Lumbar Arthrodesis  thoracic ten-lumbar two ;  Surgeon: Kary Kos, MD;  Location: [place];  Service: Neurosurgery;  Laterality: N/A;  . LAPAROSCOPIC CHOLECYSTECTOMY  2010  . LUMBAR FUSION Right 06/08/2017   LUMBAR THREE-FOUR, LUMBAR FOUR-FIVE POSTEROLATERAL ARTHRODESIS WITH RIGHT LUMBAR FOUR-FIVE LAMINECTOMY/FORAMINOTOMY  . LUMBAR LAMINECTOMY/DECOMPRESSION MICRODISCECTOMY Right 03/08/2016   Procedure: Laminectomy and Foraminotomy - Lumbar Five-Sacral One Right;  Surgeon: Kary Kos, MD;  Location: [place];  Service: Neurosurgery;  Laterality: Right;  Right  . MULTIPLE TOOTH EXTRACTIONS     "3"  . POSTERIOR LUMBAR FUSION  10/14/2016   L5-S1  . REPLACEMENT TOTAL KNEE Left 2003  . THORACIC AORTIC ANEURYSM REPAIR  2010    Family History  Problem Relation Age of Onset  . Heart failure Mother   . Hypertension Mother   . Asthma Mother   . Osteoporosis Mother   . Heart attack Father 81       MI  . Hypertension Sister   . Prostate cancer Neg Hx   . Bladder Cancer Neg Hx   . Kidney cancer Neg Hx   . Colon cancer Neg Hx    Social History:  reports that he quit smoking about 38 years ago. His smoking use included cigarettes. He has a 32.00 pack-year smoking history. He has never used smokeless tobacco. He reports current alcohol use. He reports that he does not use drugs.  Allergies:  Allergies  Allergen Reactions  . Penicillins Hives, Swelling and Other (See Comments)    SWELLING REACTION UNSPECIFIED PATIENT HAS TAKEN AMOXICILLIN ON MED HX FROM [facility] PATIENT HAS HAD A PCN REACTION WITH IMMEDIATE RASH, FACIAL/TONGUE/THROAT SWELLING, SOB, OR LIGHTHEADEDNESS WITH HYPOTENSION:  #  #  #  YES  #  #  #   Has patient had a PCN reaction causing severe rash involving mucus membranes or skin necrosis: No Has patient had a PCN reaction that required hospitalization No Has patient had a PCN reaction occurring within the last 10 years: No  . Demerol [Meperidine] Hives and Nausea And Vomiting    Medications Prior to  Admission  Medication Sig Dispense Refill  . amLODipine (NORVASC) 5 MG tablet Take 1 tablet (5 mg) by mouth once daily (Patient taking differently: Take 5 mg by mouth daily. ) 90 tablet 3  . apixaban (ELIQUIS) 5 MG TABS tablet Take 1 tablet (5 mg total) by mouth 2 (two) times daily. 180 tablet 2  . aspirin EC 81 MG tablet Take 1 tablet (81 mg total) by mouth daily. (Patient taking differently: Take 81 mg by mouth daily with supper. )    . atorvastatin (LIPITOR) 40 MG tablet TAKE ONE TABLET BY MOUTH EVERY DAY (Patient taking differently: Take 40 mg by mouth at bedtime. ) 90 tablet 3  . calcium carbonate (OS-CAL - DOSED IN MG OF ELEMENTAL CALCIUM) 1250 (500 Ca) MG tablet Take 1 tablet by mouth daily  with lunch.    . Carboxymethylcellul-Glycerin (LUBRICATING EYE DROPS OP) Place 1 drop into both eyes daily as needed (dry eyes).    . cetirizine (ZYRTEC) 10 MG tablet Take 10 mg by mouth daily.    . diphenhydrAMINE (BENADRYL) 25 MG tablet Take 25-50 mg by mouth at bedtime as needed for sleep.    Marland Kitchen docusate sodium (COLACE) 100 MG capsule Take 200 mg by mouth at bedtime.    . fenofibrate 160 MG tablet TAKE ONE TABLET AT BEDTIME (Patient taking differently: Take 160 mg by mouth at bedtime. ) 90 tablet 1  . FLUoxetine (PROZAC) 20 MG capsule TAKE 1 CAPSULE BY MOUTH ONCE DAILY (Patient taking differently: Take 20 mg by mouth daily. ) 90 capsule 1  . folic acid (FOLVITE) 1 MG tablet Take 1 mg by mouth in the morning and at bedtime.     . gabapentin (NEURONTIN) 300 MG capsule Take 300 mg by mouth 2 (two) times daily.    Marland Kitchen HYDROcodone-acetaminophen (NORCO) 10-325 MG tablet Take 1 tablet by mouth 4 (four) times daily as needed for moderate pain.     . hydrocortisone cream 1 % Apply 1 application topically daily as needed for itching.    . hydroxychloroquine (PLAQUENIL) 200 MG tablet Take 200 mg by mouth 3 (three) times a week. Tue, Thurs, and Sat    . lisinopril (ZESTRIL) 20 MG tablet Take 1 tablet (20 mg total) by  mouth every evening. (Patient taking differently: Take 20 mg by mouth daily with supper. ) 90 tablet 1  . Multiple Vitamins-Minerals (PRESERVISION AREDS 2) CAPS Take 1 capsule by mouth 2 (two) times daily.    . Omeprazole 20 MG TBEC Take 1 tablet (20 mg total) by mouth daily. (Patient taking differently: Take 1 tablet by mouth daily with lunch. ) 30 each 11  . testosterone cypionate (DEPOTESTOSTERONE CYPIONATE) 200 MG/ML injection Inject 0.25 cc every 2 weeks (Patient taking differently: Inject 100 mg into the muscle every 14 (fourteen) days. ) 10 mL 0  . vitamin B-12 (CYANOCOBALAMIN) 1000 MCG tablet Take 1,000 mcg by mouth daily with lunch.     . vitamin E 180 MG (400 UNITS) capsule Take 800 Units by mouth daily with lunch.    Marland Kitchen omeprazole (PRILOSEC) 20 MG capsule TAKE 1 CAPSULE EVERY DAY 30 capsule 1    Results for orders placed or performed during the hospital encounter of 11/02/19 (from the past 48 hour(s))  Basic metabolic panel     Status: None   Collection Time: 11/02/19 11:36 AM  Result Value Ref Range   Sodium 138 135 - 145 mmol/L   Potassium 3.8 3.5 - 5.1 mmol/L   Chloride 106 98 - 111 mmol/L   CO2 23 22 - 32 mmol/L   Glucose, Bld 81 70 - 99 mg/dL    Comment: Glucose reference range applies only to samples taken after fasting for at least 8 hours.   BUN 14 8 - 23 mg/dL   Creatinine, Ser 0.96 0.61 - 1.24 mg/dL   Calcium 9.2 8.9 - 10.3 mg/dL   GFR calc non Af Amer >60 >60 mL/min   GFR calc Af Amer >60 >60 mL/min   Anion gap 9 5 - 15    Comment: Performed at [facility] [address], [place], [place] [address]  CBC     Status: Abnormal   Collection Time: 11/02/19 11:36 AM  Result Value Ref Range   WBC 3.5 (L) 4.0 - 10.5 K/uL  RBC 3.50 (L) 4.22 - 5.81 MIL/uL   Hemoglobin 11.1 (L) 13.0 - 17.0 g/dL   HCT 35.1 (L) 39 - 52 %   MCV 100.3 (H) 80.0 - 100.0 fL   MCH 31.7 26.0 - 34.0 pg   MCHC 31.6 30.0 - 36.0 g/dL   RDW 14.1 11.5 - 15.5 %   Platelets 113 (L) 150 - 400  K/uL    Comment: Immature Platelet Fraction may be clinically indicated, consider ordering this additional test [id number] PLATELET COUNT CONFIRMED BY SMEAR REPEATED TO VERIFY    nRBC 0.0 0.0 - 0.2 %    Comment: Performed at [facility], [place] [address], [place], [place] [address]   No results found.  Review of Systems  Musculoskeletal: Positive for back pain.    Blood pressure (!) 154/77, pulse 66, temperature 97.9 F (36.6 C), temperature source Oral, resp. rate 20, height 5' 11.5" (1.816 m), weight 73 kg, SpO2 98 %. Physical Exam  HENT:  Head: Normocephalic.  Nose: Nose normal.  Mouth/Throat: Mucous membranes are moist.  Cardiovascular: Normal rate and normal pulses.  Respiratory: Effort normal.  Musculoskeletal:        General: Normal range of motion.  Neurological: He is alert. He has normal motor skills, normal sensation and intact cranial nerves. Coordination normal.  Strength 5-5 upper and lower extremities cranial nerves intact  Skin: Skin is warm.     Assessment/Plan 79 year old presents for removal of hardware  Elaina Hoops, MD 11/02/2019, 1:05 PM

## 2019-11-03 ENCOUNTER — Encounter (HOSPITAL_COMMUNITY): Payer: Self-pay | Admitting: Neurosurgery

## 2019-11-03 DIAGNOSIS — T84226A Displacement of internal fixation device of vertebrae, initial encounter: Secondary | ICD-10-CM | POA: Diagnosis not present

## 2019-11-03 MED ORDER — HYDROCODONE-ACETAMINOPHEN 5-325 MG PO TABS: 1.0000 | ORAL_TABLET | Freq: Four times a day (QID) | ORAL | 0 refills | Status: AC | PRN

## 2019-11-03 NOTE — Discharge Summary (Signed)
Physician Discharge Summary  Patient ID: Bryan [name] MRN: [id number] DOB/AGE: 81-Feb-1942 81 y.o.  Admit date: 11/02/2019 Discharge date: 11/03/2019  Admission Diagnoses:painful hardware, thoracic  Discharge Diagnoses: same Active Problems:   Painful orthopaedic hardware [facility])   Discharged Condition: good  Hospital Course: Bryan [name] is a 79 y.o. male Whom was admitted to the hospital for hardware revision and removal. Post op he is doing well, the wound is clean, dry, and without signs of infection. He is ambulating, voiding, and tolerating a regular diet at discharge. Treatments: surgery: Exploration of fusion removal of hardware with cutting the rod with a metal cutting drill bit below the second screw on the right inner up in his upper thoracic spine and below the first screw on the left with removal of 2 screws in the right one in the left at the top of his construct in his thoracic spine.   Discharge Exam: Blood pressure 129/75, pulse 60, temperature 98 F (36.7 C), temperature source Oral, resp. rate 19, height 5' 11.5" (1.816 m), weight 73 kg, SpO2 99 %. General appearance: alert, cooperative, appears stated age and no distress Neurologic: Alert and oriented X 3, normal strength and tone. Normal symmetric reflexes. Normal coordination and gait  Disposition:  Thoracic spine pain  Allergies as of 11/03/2019      Reactions   Penicillins Hives, Swelling, Other (See Comments)   SWELLING REACTION UNSPECIFIED PATIENT HAS TAKEN AMOXICILLIN ON MED HX FROM [facility] PATIENT HAS HAD A PCN REACTION WITH IMMEDIATE RASH, FACIAL/TONGUE/THROAT SWELLING, SOB, OR LIGHTHEADEDNESS WITH HYPOTENSION:  #  #  #  YES  #  #  #   Has patient had a PCN reaction causing severe rash involving mucus membranes or skin necrosis: No Has patient had a PCN reaction that required hospitalization No Has patient had a PCN reaction occurring within the last 10 years: No   Demerol [meperidine] Hives,  Nausea And Vomiting      Medication List    TAKE these medications   amLODipine 5 MG tablet Commonly known as: NORVASC Take 1 tablet (5 mg) by mouth once daily What changed:   how much to take  how to take this  when to take this  additional instructions   apixaban 5 MG Tabs tablet Commonly known as: Eliquis Take 1 tablet (5 mg total) by mouth 2 (two) times daily.   aspirin EC 81 MG tablet Take 1 tablet (81 mg total) by mouth daily. What changed: when to take this   atorvastatin 40 MG tablet Commonly known as: LIPITOR TAKE ONE TABLET BY MOUTH EVERY DAY What changed: when to take this   calcium carbonate 1250 (500 Ca) MG tablet Commonly known as: OS-CAL - dosed in mg of elemental calcium Take 1 tablet by mouth daily with lunch.   cetirizine 10 MG tablet Commonly known as: ZYRTEC Take 10 mg by mouth daily.   diphenhydrAMINE 25 MG tablet Commonly known as: BENADRYL Take 25-50 mg by mouth at bedtime as needed for sleep.   docusate sodium 100 MG capsule Commonly known as: COLACE Take 200 mg by mouth at bedtime.   fenofibrate 160 MG tablet TAKE ONE TABLET AT BEDTIME   FLUoxetine 20 MG capsule Commonly known as: PROZAC TAKE 1 CAPSULE BY MOUTH ONCE DAILY   folic acid 1 MG tablet Commonly known as: FOLVITE Take 1 mg by mouth in the morning and at bedtime.   gabapentin 300 MG capsule Commonly known as: NEURONTIN Take 300 mg by  mouth 2 (two) times daily.   HYDROcodone-acetaminophen 10-325 MG tablet Commonly known as: NORCO Take 1 tablet by mouth 4 (four) times daily as needed for moderate pain. What changed: Another medication with the same name was added. Make sure you understand how and when to take each.   HYDROcodone-acetaminophen 5-325 MG tablet Commonly known as: NORCO/VICODIN Take 1 tablet by mouth every 6 (six) hours as needed for up to 7 days for moderate pain. What changed: You were already taking a medication with the same name, and this  prescription was added. Make sure you understand how and when to take each.   hydrocortisone cream 1 % Apply 1 application topically daily as needed for itching.   hydroxychloroquine 200 MG tablet Commonly known as: PLAQUENIL Take 200 mg by mouth 3 (three) times a week. Tue, Thurs, and Sat   lisinopril 20 MG tablet Commonly known as: ZESTRIL Take 1 tablet (20 mg total) by mouth every evening. What changed: when to take this   LUBRICATING EYE DROPS OP Place 1 drop into both eyes daily as needed (dry eyes).   Omeprazole 20 MG Tbec Take 1 tablet (20 mg total) by mouth daily. What changed: when to take this   omeprazole 20 MG capsule Commonly known as: PRILOSEC TAKE 1 CAPSULE EVERY DAY What changed: Another medication with the same name was changed. Make sure you understand how and when to take each.   PreserVision AREDS 2 Caps Take 1 capsule by mouth 2 (two) times daily.   testosterone cypionate 200 MG/ML injection Commonly known as: DEPOTESTOSTERONE CYPIONATE Inject 0.25 cc every 2 weeks What changed:   how much to take  how to take this  when to take this  additional instructions   vitamin B-12 1000 MCG tablet Commonly known as: CYANOCOBALAMIN Take 1,000 mcg by mouth daily with lunch.   vitamin E 180 MG (400 UNITS) capsule Take 800 Units by mouth daily with lunch.       Follow-up Information    Kary Kos, MD Follow up.   Specialty: Neurosurgery Contact information: [address] [place] [address] [phone]               Signed: Ashok Pall 11/03/2019, 10:00 AM

## 2019-11-03 NOTE — Plan of Care (Signed)
Patient alert and oriented, mae's well, voiding adequate amount of urine, swallowing without difficulty, no c/o pain at time of discharge. Patient discharged home with family. Script and discharged instructions given to patient. Patient and family stated understanding of instructions given. Patient has an appointment with Dr. Cram 

## 2019-11-03 NOTE — Discharge Instructions (Signed)
Wound Care  Keep the incision clean and dry remove the outer dressing in 2 days, leave the Steri-Strips intact.  Do not put any creams, lotions, or ointments on incision. Leave steri-strips on back.  They will fall off by themselves.  Activity Walk each and every day, increasing distance each day. No lifting greater than 5 lbs.  Be mindful of what you do at home  Diet Resume your normal diet.   Return to Work Will be discussed at you follow up appointment.  Call Your Doctor If Any of These Occur Redness, drainage, or swelling at the wound.  Temperature greater than 101 degrees. Severe pain not relieved by pain medication. Incision starts to come apart. Follow Up Appt Call [phone]) for problems.

## 2019-11-12 ENCOUNTER — Inpatient Hospital Stay
Admission: EM | Admit: 2019-11-12 | Discharge: 2019-11-14 | DRG: 291 | Disposition: A | Discharge status: Home / Self Care | Payer: Medicare Other | Attending: Internal Medicine | Admitting: Internal Medicine

## 2019-11-12 ENCOUNTER — Emergency Department: Payer: Medicare Other

## 2019-11-12 ENCOUNTER — Telehealth: Payer: Self-pay | Admitting: Family Medicine

## 2019-11-12 ENCOUNTER — Other Ambulatory Visit: Payer: Self-pay

## 2019-11-12 DIAGNOSIS — I130 Hypertensive heart and chronic kidney disease with heart failure and stage 1 through stage 4 chronic kidney disease, or unspecified chronic kidney disease: Principal | ICD-10-CM | POA: Diagnosis present

## 2019-11-12 DIAGNOSIS — Z885 Allergy status to narcotic agent status: Secondary | ICD-10-CM

## 2019-11-12 DIAGNOSIS — I700 Atherosclerosis of aorta: Secondary | ICD-10-CM | POA: Diagnosis present

## 2019-11-12 DIAGNOSIS — I5031 Acute diastolic (congestive) heart failure: Secondary | ICD-10-CM | POA: Diagnosis not present

## 2019-11-12 DIAGNOSIS — E785 Hyperlipidemia, unspecified: Secondary | ICD-10-CM | POA: Diagnosis present

## 2019-11-12 DIAGNOSIS — I2510 Atherosclerotic heart disease of native coronary artery without angina pectoris: Secondary | ICD-10-CM | POA: Diagnosis present

## 2019-11-12 DIAGNOSIS — Z8249 Family history of ischemic heart disease and other diseases of the circulatory system: Secondary | ICD-10-CM

## 2019-11-12 DIAGNOSIS — Z953 Presence of xenogenic heart valve: Secondary | ICD-10-CM

## 2019-11-12 DIAGNOSIS — Z961 Presence of intraocular lens: Secondary | ICD-10-CM | POA: Diagnosis present

## 2019-11-12 DIAGNOSIS — N179 Acute kidney failure, unspecified: Secondary | ICD-10-CM | POA: Diagnosis present

## 2019-11-12 DIAGNOSIS — Z96652 Presence of left artificial knee joint: Secondary | ICD-10-CM | POA: Diagnosis present

## 2019-11-12 DIAGNOSIS — Z952 Presence of prosthetic heart valve: Secondary | ICD-10-CM | POA: Diagnosis not present

## 2019-11-12 DIAGNOSIS — F329 Major depressive disorder, single episode, unspecified: Secondary | ICD-10-CM | POA: Diagnosis present

## 2019-11-12 DIAGNOSIS — J9601 Acute respiratory failure with hypoxia: Secondary | ICD-10-CM | POA: Diagnosis not present

## 2019-11-12 DIAGNOSIS — M069 Rheumatoid arthritis, unspecified: Secondary | ICD-10-CM | POA: Diagnosis present

## 2019-11-12 DIAGNOSIS — R042 Hemoptysis: Secondary | ICD-10-CM | POA: Diagnosis not present

## 2019-11-12 DIAGNOSIS — Z9049 Acquired absence of other specified parts of digestive tract: Secondary | ICD-10-CM

## 2019-11-12 DIAGNOSIS — Z880 Allergy status to penicillin: Secondary | ICD-10-CM

## 2019-11-12 DIAGNOSIS — Z7901 Long term (current) use of anticoagulants: Secondary | ICD-10-CM

## 2019-11-12 DIAGNOSIS — Z9841 Cataract extraction status, right eye: Secondary | ICD-10-CM

## 2019-11-12 DIAGNOSIS — D509 Iron deficiency anemia, unspecified: Secondary | ICD-10-CM | POA: Diagnosis present

## 2019-11-12 DIAGNOSIS — I272 Pulmonary hypertension, unspecified: Secondary | ICD-10-CM | POA: Diagnosis present

## 2019-11-12 DIAGNOSIS — I5033 Acute on chronic diastolic (congestive) heart failure: Secondary | ICD-10-CM | POA: Diagnosis present

## 2019-11-12 DIAGNOSIS — Z9842 Cataract extraction status, left eye: Secondary | ICD-10-CM

## 2019-11-12 DIAGNOSIS — Z8262 Family history of osteoporosis: Secondary | ICD-10-CM

## 2019-11-12 DIAGNOSIS — Z79899 Other long term (current) drug therapy: Secondary | ICD-10-CM

## 2019-11-12 DIAGNOSIS — Z7982 Long term (current) use of aspirin: Secondary | ICD-10-CM

## 2019-11-12 DIAGNOSIS — K219 Gastro-esophageal reflux disease without esophagitis: Secondary | ICD-10-CM | POA: Diagnosis present

## 2019-11-12 DIAGNOSIS — J810 Acute pulmonary edema: Secondary | ICD-10-CM

## 2019-11-12 DIAGNOSIS — Z20822 Contact with and (suspected) exposure to covid-19: Secondary | ICD-10-CM | POA: Diagnosis present

## 2019-11-12 DIAGNOSIS — Z981 Arthrodesis status: Secondary | ICD-10-CM

## 2019-11-12 DIAGNOSIS — Z87891 Personal history of nicotine dependence: Secondary | ICD-10-CM

## 2019-11-12 DIAGNOSIS — Z825 Family history of asthma and other chronic lower respiratory diseases: Secondary | ICD-10-CM

## 2019-11-12 DIAGNOSIS — H409 Unspecified glaucoma: Secondary | ICD-10-CM | POA: Diagnosis present

## 2019-11-12 DIAGNOSIS — I480 Paroxysmal atrial fibrillation: Secondary | ICD-10-CM | POA: Diagnosis present

## 2019-11-12 DIAGNOSIS — N182 Chronic kidney disease, stage 2 (mild): Secondary | ICD-10-CM | POA: Diagnosis present

## 2019-11-12 DIAGNOSIS — F419 Anxiety disorder, unspecified: Secondary | ICD-10-CM | POA: Diagnosis present

## 2019-11-12 LAB — CBC
HCT: 25 % — ABNORMAL LOW (ref 39.0–52.0)
Hemoglobin: 8.1 g/dL — ABNORMAL LOW (ref 13.0–17.0)
MCH: 31.4 pg (ref 26.0–34.0)
MCHC: 32.4 g/dL (ref 30.0–36.0)
MCV: 96.9 fL (ref 80.0–100.0)
Platelets: 144 10*3/uL — ABNORMAL LOW (ref 150–400)
RBC: 2.58 MIL/uL — ABNORMAL LOW (ref 4.22–5.81)
RDW: 13.8 % (ref 11.5–15.5)
WBC: 6.9 10*3/uL (ref 4.0–10.5)
nRBC: 0 % (ref 0.0–0.2)

## 2019-11-12 LAB — BRAIN NATRIURETIC PEPTIDE: B Natriuretic Peptide: 461.9 pg/mL — ABNORMAL HIGH (ref 0.0–100.0)

## 2019-11-12 LAB — BASIC METABOLIC PANEL
Anion gap: 7 (ref 5–15)
BUN: 30 mg/dL — ABNORMAL HIGH (ref 8–23)
CO2: 24 mmol/L (ref 22–32)
Calcium: 8.4 mg/dL — ABNORMAL LOW (ref 8.9–10.3)
Chloride: 107 mmol/L (ref 98–111)
Creatinine, Ser: 1.41 mg/dL — ABNORMAL HIGH (ref 0.61–1.24)
GFR calc Af Amer: 54 mL/min — ABNORMAL LOW (ref >60)
GFR calc non Af Amer: 46 mL/min — ABNORMAL LOW (ref >60)
Glucose, Bld: 115 mg/dL — ABNORMAL HIGH (ref 70–99)
Potassium: 4.4 mmol/L (ref 3.5–5.1)
Sodium: 138 mmol/L (ref 135–145)

## 2019-11-12 LAB — TYPE AND SCREEN
ABO/RH(D): O POS
Antibody Screen: NEGATIVE

## 2019-11-12 LAB — SARS CORONAVIRUS 2 BY RT PCR (HOSPITAL ORDER, PERFORMED IN ~~LOC~~ HOSPITAL LAB): SARS Coronavirus 2: NEGATIVE

## 2019-11-12 LAB — TROPONIN I (HIGH SENSITIVITY): Troponin I (High Sensitivity): 20 ng/L — ABNORMAL HIGH (ref <18)

## 2019-11-12 MED ORDER — LISINOPRIL 20 MG PO TABS
20.0000 mg | ORAL_TABLET | Freq: Every day | ORAL | Status: DC
  Administered 2019-11-13 (×2): 20 mg via ORAL
  Filled 2019-11-12: qty 2
  Filled 2019-11-12: qty 1

## 2019-11-12 MED ORDER — VITAMIN B-12 1000 MCG PO TABS
1000.0000 ug | ORAL_TABLET | Freq: Every day | ORAL | Status: DC
  Administered 2019-11-13 – 2019-11-14 (×2): 1000 ug via ORAL
  Filled 2019-11-12 (×3): qty 1

## 2019-11-12 MED ORDER — POLYVINYL ALCOHOL 1.4 % OP SOLN
1.0000 [drp] | Freq: Every day | OPHTHALMIC | Status: DC | PRN
  Filled 2019-11-12: qty 15

## 2019-11-12 MED ORDER — SODIUM CHLORIDE 0.9% FLUSH
3.0000 mL | Freq: Two times a day (BID) | INTRAVENOUS | Status: DC
  Administered 2019-11-13 – 2019-11-14 (×4): 3 mL via INTRAVENOUS

## 2019-11-12 MED ORDER — OCUVITE-LUTEIN PO CAPS
1.0000 | ORAL_CAPSULE | Freq: Two times a day (BID) | ORAL | Status: DC
  Administered 2019-11-13 – 2019-11-14 (×3): 1 via ORAL
  Filled 2019-11-12 (×5): qty 1

## 2019-11-12 MED ORDER — FLUOXETINE HCL 20 MG PO CAPS
20.0000 mg | ORAL_CAPSULE | Freq: Every day | ORAL | Status: DC
  Administered 2019-11-13 – 2019-11-14 (×2): 20 mg via ORAL
  Filled 2019-11-12 (×2): qty 1

## 2019-11-12 MED ORDER — IOHEXOL 350 MG/ML SOLN
60.0000 mL | Freq: Once | INTRAVENOUS | Status: AC | PRN
  Administered 2019-11-12: 60 mL via INTRAVENOUS

## 2019-11-12 MED ORDER — FENOFIBRATE 160 MG PO TABS
160.0000 mg | ORAL_TABLET | Freq: Every day | ORAL | Status: DC
  Administered 2019-11-13 (×2): 160 mg via ORAL
  Filled 2019-11-12 (×3): qty 1

## 2019-11-12 MED ORDER — PANTOPRAZOLE SODIUM 40 MG PO TBEC
40.0000 mg | DELAYED_RELEASE_TABLET | Freq: Every day | ORAL | Status: DC
  Administered 2019-11-13 – 2019-11-14 (×2): 40 mg via ORAL
  Filled 2019-11-12 (×2): qty 1

## 2019-11-12 MED ORDER — CALCIUM CARBONATE ANTACID 500 MG PO CHEW
2.5000 | CHEWABLE_TABLET | Freq: Every day | ORAL | Status: DC
  Administered 2019-11-13 – 2019-11-14 (×2): 500 mg via ORAL
  Filled 2019-11-12 (×2): qty 3

## 2019-11-12 MED ORDER — ATORVASTATIN CALCIUM 20 MG PO TABS
40.0000 mg | ORAL_TABLET | Freq: Every day | ORAL | Status: DC
  Administered 2019-11-13 (×2): 40 mg via ORAL
  Filled 2019-11-12 (×2): qty 2

## 2019-11-12 MED ORDER — FUROSEMIDE 10 MG/ML IJ SOLN
20.0000 mg | Freq: Two times a day (BID) | INTRAMUSCULAR | Status: DC
  Administered 2019-11-13 – 2019-11-14 (×4): 20 mg via INTRAVENOUS
  Filled 2019-11-12: qty 4
  Filled 2019-11-12 (×3): qty 2

## 2019-11-12 MED ORDER — DOCUSATE SODIUM 100 MG PO CAPS
200.0000 mg | ORAL_CAPSULE | Freq: Every day | ORAL | Status: DC
  Administered 2019-11-13 (×2): 200 mg via ORAL
  Filled 2019-11-12 (×2): qty 2

## 2019-11-12 MED ORDER — FOLIC ACID 1 MG PO TABS
1.0000 mg | ORAL_TABLET | Freq: Every day | ORAL | Status: DC
  Administered 2019-11-13 – 2019-11-14 (×2): 1 mg via ORAL
  Filled 2019-11-12 (×2): qty 1

## 2019-11-12 MED ORDER — SODIUM CHLORIDE 0.9% FLUSH: 3.0000 mL | INTRAVENOUS | Status: DC | PRN

## 2019-11-12 MED ORDER — FUROSEMIDE 10 MG/ML IJ SOLN
60.0000 mg | Freq: Once | INTRAMUSCULAR | Status: AC
  Administered 2019-11-12: 60 mg via INTRAVENOUS
  Filled 2019-11-12: qty 8

## 2019-11-12 MED ORDER — VITAMIN E 180 MG (400 UNIT) PO CAPS
800.0000 [IU] | ORAL_CAPSULE | Freq: Every day | ORAL | Status: DC
  Administered 2019-11-14: 800 [IU] via ORAL
  Filled 2019-11-12 (×2): qty 2

## 2019-11-12 MED ORDER — HYDROXYCHLOROQUINE SULFATE 200 MG PO TABS
200.0000 mg | ORAL_TABLET | ORAL | Status: DC
  Administered 2019-11-13: 200 mg via ORAL
  Filled 2019-11-12: qty 1

## 2019-11-12 MED ORDER — ONDANSETRON HCL 4 MG/2ML IJ SOLN: 4.0000 mg | Freq: Four times a day (QID) | INTRAMUSCULAR | Status: DC | PRN

## 2019-11-12 MED ORDER — GABAPENTIN 300 MG PO CAPS
300.0000 mg | ORAL_CAPSULE | Freq: Two times a day (BID) | ORAL | Status: DC
  Administered 2019-11-13 – 2019-11-14 (×4): 300 mg via ORAL
  Filled 2019-11-12 (×4): qty 1

## 2019-11-12 MED ORDER — ACETAMINOPHEN 325 MG PO TABS
650.0000 mg | ORAL_TABLET | ORAL | Status: DC | PRN
  Filled 2019-11-12: qty 2

## 2019-11-12 MED ORDER — SODIUM CHLORIDE 0.9 % IV SOLN: 250.0000 mL | INTRAVENOUS | Status: DC | PRN

## 2019-11-12 NOTE — ED Notes (Signed)
Pt given water to drink and updated on pending admission.

## 2019-11-12 NOTE — Telephone Encounter (Signed)
ACCESS Nurse  Pt called and said that he is having chest congestion and is coughing up blood

## 2019-11-12 NOTE — ED Triage Notes (Addendum)
Pt c/o a cough with congestion for the past 2 days and last night and this morning has blood in his sputum and is concerned. Denies SOB or pain. States he thought it was the spaghetti he ate last night until he noticed it again this morning. Pt had surgery to remove hardware from a previous surgery in the thoracic area on 6/11.

## 2019-11-12 NOTE — ED Notes (Signed)
Pt back from CT. Oxygen sats are 88%. Pt placed on 2L of O2. Sats increased to 90s

## 2019-11-12 NOTE — ED Notes (Signed)
Blue top sent to lab as well.  

## 2019-11-12 NOTE — Telephone Encounter (Signed)
Patient states he is currently in the Emergency room. He is in no pain except for back pain from recent surgery on 11/02/2019. He has had a chest Xray done and blood drawn and is waiting to speak to a provider.

## 2019-11-12 NOTE — H&P (Signed)
[facility] NAME: Bryan [name]    MR#:  [id number]  DATE OF BIRTH:  [birth year]  DATE OF ADMISSION:  11/12/2019  PRIMARY CARE PHYSICIAN: [name], MD   REQUESTING/REFERRING PHYSICIAN: Dr. Kerman Passey  Patient coming from : home  No family in the ER. Patient is independent of ADLs at home CHIEF COMPLAINT:  increasing shortness of breath and coughing of blood since last night  HISTORY OF PRESENT ILLNESS:  Bryan [name]  is a 79 y.o. 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 [name], depression, [name], coronary artery disease comes to the emergency room after he started coughing up blood yesterday.  Patient recently had neurosurgical procedure with removal of hardware from his thoracic spine on 12 June. He was resume back on his eliquis. He did get IV fluids during the procedure. Patient denies any recent travel. Started having shortness of breath and coughing up blood since yesterday. Denies any leg swelling chest pain dysuria.   ED course: in the emergency room, patient was found to be hypoxic with sats 85 to 89. Placed on 2 L nasal cannula oxygen sats were 95 on 2 L. Blood pressure was slightly on the higher side.  Hemoglobin 8.1 (11.3 on 12 June) creatinine 1.4 (baseline .96) workup in the ER showed bilateral patchy infiltrate/pulmonary edema on chest x-ray. Given hemoptysis patient underwent CTA of the chest. 1. No CT evidence of pulmonary artery embolus. 2. Cardiomegaly with findings of CHF and small bilateral pleural effusions. Pneumonia is not excluded. Clinical correlation is recommended. 3. Dilatation of the main pulmonary trunk suggestive of pulmonary hypertension. Clinical correlation is recommended  Patient denies fever white  count is normal. He received IV Lasix 60 mg. Patient is being admitted with acute hypoxic respiratory failure secondary to pulmonary edema in the setting of acute diastolic congestive heart failure with pulmonary hypertension.  PAST MEDICAL HISTORY:   Past Medical History:  Diagnosis Date  . Abnormal CT scan    Asymmetric left rectal wall thickening   . Anemia   . Anxiety   . Arrhythmia   . Chronic lower back pain   . Complication of anesthesia    Memory loss 09/2015  . Coronary artery disease   . Depression   . GERD (gastroesophageal reflux disease)   . Glaucoma   . H/O thoracic aortic aneurysm repair   . Headache   . Heart murmur   . History of being hospitalized    memory lose kidney funtion down blood pressure up  . History of blood transfusion    "think he had one when he had heart valve OR" (10/14/2016); "none since" (10/19/2017)  . History of chicken pox   . Hyperlipidemia   . Hypertension   . Lumbar stenosis   . Murmur   . Neuropathy   . Osteoarthritis    worse in feet and ankles  . Paroxysmal A-fib ([place])   . Poor short term memory    takes Aricept  . Rheumatoid arthritis ([place])    "all over" (10/14/2016)  . Schizophrenia ([place])   . Seasonal allergies   . Thoracic spine pain   . Valvular heart disease   . Wears glasses    reading    PAST SURGICAL HISTOIRY:   Past Surgical History:  Procedure Laterality Date  . AORTIC VALVE  REPLACEMENT  2007   [facility].  [place], [place]; "pig valve"  . APPENDECTOMY  2010  . BACK SURGERY    . CARDIAC VALVE REPLACEMENT    . CATARACT EXTRACTION W/ INTRAOCULAR LENS  IMPLANT, BILATERAL Bilateral 2012  . COLONOSCOPY WITH PROPOFOL N/A 09/24/2016   Procedure: COLONOSCOPY WITH PROPOFOL;  Surgeon: Jonathon Bellows, MD;  Location: [facility] ENDOSCOPY;  Service: Endoscopy;  Laterality: N/A;  . ESOPHAGOGASTRODUODENOSCOPY (EGD) WITH PROPOFOL N/A 09/24/2016   Procedure: ESOPHAGOGASTRODUODENOSCOPY (EGD) WITH PROPOFOL;  Surgeon: Jonathon Bellows, MD;   Location: [facility] ENDOSCOPY;  Service: Endoscopy;  Laterality: N/A;  . GIVENS CAPSULE STUDY N/A 09/24/2016   Procedure: GIVENS CAPSULE STUDY;  Surgeon: Jonathon Bellows, MD;  Location: [facility] ENDOSCOPY;  Service: Endoscopy;  Laterality: N/A;  . HAMMER TOE SURGERY Right 10/09/2015   Procedure: HAMMER TOE REPAIR WITH K-WIRE FIXATION RIGHT SECOND TOE;  Surgeon: Albertine Patricia, DPM;  Location: [place];  Service: Podiatry;  Laterality: Right;  WITH LOCAL  . HARDWARE REMOVAL N/A 11/02/2019   Procedure: Removal of hardware thoracic spine;  Surgeon: Kary Kos, MD;  Location: [place];  Service: Neurosurgery;  Laterality: N/A;  posterior  . INGUINAL HERNIA REPAIR Right 05/05/2018   Medium Bard PerFix plug.  Surgeon: Robert Bellow, MD;  Location: [facility] ORS;  Service: General;  Laterality: Right;  . IR VERTEBROPLASTY CERV/THOR BX INC UNI/BIL INC/INJECT/IMAGING  10/27/2018  . JOINT REPLACEMENT Left    left total knee  . LAMINECTOMY WITH POSTERIOR LATERAL ARTHRODESIS LEVEL 3 N/A 09/28/2017   Procedure: LUMBAR  POSTEROR  FUSION REVISION - LUMBAR ONE -TWO, LUMBAR TWO -THREE,THREE-FOUR ,BILATERAL ARTHRODESIS REMOVAL LUMBAR THREE HARDWARE;  Surgeon: Kary Kos, MD;  Location: [place];  Service: Neurosurgery;  Laterality: N/A;  . LAMINECTOMY WITH POSTERIOR LATERAL ARTHRODESIS LEVEL 3 N/A 10/20/2017   Procedure: Revision fusion and removal of hardware Lumbar one, Pedicle screw fixation thoracic ten-lumbar two, thoracic nine-ten laminotomy, Posterior Lumbar Arthrodesis thoracic ten-lumbar two ;  Surgeon: Kary Kos, MD;  Location: [facility];  Service: Neurosurgery;  Laterality: N/A;  . LAPAROSCOPIC CHOLECYSTECTOMY  2010  . LUMBAR FUSION Right 06/08/2017   LUMBAR THREE-FOUR, LUMBAR FOUR-FIVE POSTEROLATERAL ARTHRODESIS WITH RIGHT LUMBAR FOUR-FIVE LAMINECTOMY/FORAMINOTOMY  . LUMBAR LAMINECTOMY/DECOMPRESSION MICRODISCECTOMY Right 03/08/2016   Procedure: Laminectomy and Foraminotomy - Lumbar Five-Sacral One Right;  Surgeon: Kary Kos, MD;  Location: [place];  Service: Neurosurgery;  Laterality: Right;  Right  . MULTIPLE TOOTH EXTRACTIONS     "3"  . POSTERIOR LUMBAR FUSION  10/14/2016   L5-S1  . REPLACEMENT TOTAL KNEE Left 2003  . THORACIC AORTIC ANEURYSM REPAIR  2010    SOCIAL HISTORY:   Social History   Tobacco Use  . Smoking status: Former Smoker    Packs/day: 1.00    Years: 32.00    Pack years: 32.00    Types: Cigarettes    Quit date: 07/05/1981    Years since quitting: 38.3  . Smokeless tobacco: Never Used  Substance Use Topics  . Alcohol use: Yes    Comment: "glass of wine/month; if that"    FAMILY HISTORY:   Family History  Problem Relation Age of Onset  . Heart failure Mother   . Hypertension Mother   . Asthma Mother   . Osteoporosis Mother   . Heart attack Father 20       MI  . Hypertension Sister   . Prostate cancer Neg Hx   . Bladder Cancer Neg Hx   . Kidney cancer Neg Hx   .  Colon cancer Neg Hx     DRUG ALLERGIES:   Allergies  Allergen Reactions  . Penicillins Hives, Swelling and Other (See Comments)    SWELLING REACTION UNSPECIFIED PATIENT HAS TAKEN AMOXICILLIN ON MED HX FROM [facility] PATIENT HAS HAD A PCN REACTION WITH IMMEDIATE RASH, FACIAL/TONGUE/THROAT SWELLING, SOB, OR LIGHTHEADEDNESS WITH HYPOTENSION:  #  #  #  YES  #  #  #   Has patient had a PCN reaction causing severe rash involving mucus membranes or skin necrosis: No Has patient had a PCN reaction that required hospitalization No Has patient had a PCN reaction occurring within the last 10 years: No  . Demerol [Meperidine] Hives and Nausea And Vomiting    REVIEW OF SYSTEMS:  Review of Systems  Constitutional: Negative for chills, fever and weight loss.  HENT: Negative for ear discharge, ear pain and nosebleeds.   Eyes: Negative for blurred vision, pain and discharge.  Respiratory: Positive for hemoptysis and shortness of breath. Negative for sputum production, wheezing and stridor.   Cardiovascular: Negative for  chest pain, palpitations, orthopnea and PND.  Gastrointestinal: Negative for abdominal pain, diarrhea, nausea and vomiting.  Genitourinary: Negative for frequency and urgency.  Musculoskeletal: Negative for back pain and joint pain.  Neurological: Positive for weakness. Negative for sensory change, speech change and focal weakness.  Psychiatric/Behavioral: Negative for depression and hallucinations. The patient is not nervous/anxious.      MEDICATIONS AT HOME:   Prior to Admission medications   Medication Sig Start Date End Date Taking? Authorizing Provider  amLODipine (NORVASC) 5 MG tablet Take 1 tablet (5 mg) by mouth once daily Patient taking differently: Take 5 mg by mouth daily.  03/05/19   Minna Merritts, MD  apixaban (ELIQUIS) 5 MG TABS tablet Take 1 tablet (5 mg total) by mouth 2 (two) times daily. 04/12/19   Minna Merritts, MD  aspirin EC 81 MG tablet Take 1 tablet (81 mg total) by mouth daily. Patient taking differently: Take 81 mg by mouth daily with supper.  03/05/19   Minna Merritts, MD  atorvastatin (LIPITOR) 40 MG tablet TAKE ONE TABLET BY MOUTH EVERY DAY Patient taking differently: Take 40 mg by mouth at bedtime.  08/27/19   [name], MD  calcium carbonate (OS-CAL - DOSED IN MG OF ELEMENTAL CALCIUM) 1250 (500 Ca) MG tablet Take 1 tablet by mouth daily with lunch.    [provider]  Carboxymethylcellul-Glycerin (LUBRICATING EYE DROPS OP) Place 1 drop into both eyes daily as needed (dry eyes).    [provider]  cetirizine (ZYRTEC) 10 MG tablet Take 10 mg by mouth daily.    [provider]  diphenhydrAMINE (BENADRYL) 25 MG tablet Take 25-50 mg by mouth at bedtime as needed for sleep.    [provider]  docusate sodium (COLACE) 100 MG capsule Take 200 mg by mouth at bedtime.    [provider]  fenofibrate 160 MG tablet TAKE ONE TABLET AT BEDTIME Patient taking differently: Take 160 mg by mouth at bedtime.  09/24/19    Minna Merritts, MD  FLUoxetine (PROZAC) 20 MG capsule TAKE 1 CAPSULE BY MOUTH ONCE DAILY Patient taking differently: Take 20 mg by mouth daily.  10/18/19   [name], MD  folic acid (FOLVITE) 1 MG tablet Take 1 mg by mouth in the morning and at bedtime.  07/23/19   [provider]  gabapentin (NEURONTIN) 300 MG capsule Take 300 mg by mouth 2 (two) times daily.  [provider]  HYDROcodone-acetaminophen (NORCO) 10-325 MG tablet Take 1 tablet by mouth 4 (four) times daily as needed for moderate pain.     [provider]  hydrocortisone cream 1 % Apply 1 application topically daily as needed for itching.    [provider]  hydroxychloroquine (PLAQUENIL) 200 MG tablet Take 200 mg by mouth 3 (three) times a week. Tue, Thurs, and Sat    [provider]  lisinopril (ZESTRIL) 20 MG tablet Take 1 tablet (20 mg total) by mouth every evening. Patient taking differently: Take 20 mg by mouth daily with supper.  10/19/19   Minna Merritts, MD  Multiple Vitamins-Minerals (PRESERVISION AREDS 2) CAPS Take 1 capsule by mouth 2 (two) times daily.    [provider]  omeprazole (PRILOSEC) 20 MG capsule TAKE 1 CAPSULE EVERY DAY 10/29/19   [name], MD  Omeprazole 20 MG TBEC Take 1 tablet (20 mg total) by mouth daily. Patient taking differently: Take 1 tablet by mouth daily with lunch.  07/18/18   Renato Shin, MD  testosterone cypionate (DEPOTESTOSTERONE CYPIONATE) 200 MG/ML injection Inject 0.25 cc every 2 weeks Patient taking differently: Inject 100 mg into the muscle every 14 (fourteen) days.  04/13/19   Zara Council A, PA-C  vitamin B-12 (CYANOCOBALAMIN) 1000 MCG tablet Take 1,000 mcg by mouth daily with lunch.     [provider]  vitamin E 180 MG (400 UNITS) capsule Take 800 Units by mouth daily with lunch.    [provider]      VITAL SIGNS:  Blood pressure (!) 174/150, pulse 90, temperature 98.4 F (36.9 C),  resp. rate 20, height 5' 11.5" (1.816 m), weight 73 kg, SpO2 94 %.  PHYSICAL EXAMINATION:  GENERAL:  79 y.o.-year-old patient lying in the bed with no acute distress. Pallor+ EYES: Pupils equal, round, reactive to light and accommodation. No scleral icterus.  HEENT: Head atraumatic, normocephalic. Oropharynx and nasopharynx clear.  NECK:  Supple, no jugular venous distention. No thyroid enlargement, no tenderness.  LUNGS: decreased breath sounds bilaterally, no wheezing, rales,rhonchi or crepitation. No use of accessory muscles of respiration.  CARDIOVASCULAR: S1, S2 normal. No murmurs, rubs, or gallops.  ABDOMEN: Soft, nontender, nondistended. Bowel sounds present. No organomegaly or mass.  EXTREMITIES: No pedal edema, cyanosis, or clubbing.  NEUROLOGIC: Cranial nerves II through XII are intact. Muscle strength 5/5 in all extremities. Sensation intact. Gait not checked.  PSYCHIATRIC:  patient is alert and oriented x 3.  SKIN: No obvious rash, lesion, or ulcer.   LABORATORY PANEL:   CBC Recent Labs  Lab 11/12/19 1108  WBC 6.9  HGB 8.1*  HCT 25.0*  PLT 144*   ------------------------------------------------------------------------------------------------------------------  Chemistries  Recent Labs  Lab 11/12/19 1108  NA 138  K 4.4  CL 107  CO2 24  GLUCOSE 115*  BUN 30*  CREATININE 1.41*  CALCIUM 8.4*   ------------------------------------------------------------------------------------------------------------------  Cardiac Enzymes No results for input(s): TROPONINI in the last 168 hours. ------------------------------------------------------------------------------------------------------------------  RADIOLOGY:  DG Chest 2 View  Result Date: 11/12/2019 CLINICAL DATA:  Cough and congestion for 2 days. EXAM: CHEST - 2 VIEW COMPARISON:  Single-view of the chest 10/15/2016. FINDINGS: There is bilateral perihilar and upper lung zone airspace disease which is symmetric  from right to left. No pneumothorax or pleural effusion. Cardiomegaly. Atherosclerosis is seen. No acute bony abnormality. Remote thoracic spine fractures and postoperative change of spinal fusion and vertebral augmentation noted. IMPRESSION: Symmetric bilateral airspace disease be secondary to pulmonary edema  or pneumonia. Cardiomegaly. Aortic Atherosclerosis (ICD10-I70.0). Electronically Signed   By: Inge Rise M.D.   On: 11/12/2019 11:30   CT Angio Chest PE W and/or Wo Contrast  Result Date: 11/12/2019 CLINICAL DATA:  79 year old male with hemoptysis. EXAM: CT ANGIOGRAPHY CHEST WITH CONTRAST TECHNIQUE: Multidetector CT imaging of the chest was performed using the standard protocol during bolus administration of intravenous contrast. Multiplanar CT image reconstructions and MIPs were obtained to evaluate the vascular anatomy. CONTRAST:  27mL OMNIPAQUE IOHEXOL 350 MG/ML SOLN COMPARISON:  Chest radiograph dated 11/12/2019 and CT dated 08/09/2016. FINDINGS: Evaluation of this exam is limited due to respiratory motion artifact. Cardiovascular: Mild cardiomegaly. No pericardial effusion. There is mild atherosclerotic calcification of the thoracic aorta. The aorta is tortuous. No aneurysmal dilatation or dissection. There is dilatation of the main pulmonary trunk suggestive of pulmonary hypertension. Clinical correlation is recommended. Evaluation of the pulmonary arteries is somewhat limited due to respiratory motion artifact. No pulmonary artery embolus identified. Mediastinum/Nodes: There is no hilar or mediastinal adenopathy. The esophagus is grossly unremarkable. No mediastinal fluid collection. Lungs/Pleura: Small bilateral pleural effusions. There is diffuse interstitial and interlobular septal prominence with patchy areas of airspace opacity primarily involving the upper lobes. Findings most consistent with edema, although pneumonia is not excluded clinical correlation is recommended. There is no  pneumothorax. The central airways are patent. Upper Abdomen: Indeterminate 2.5 cm partially visualized right renal upper pole hypodense lesion, likely a cyst. Cholecystectomy. Musculoskeletal: Osteopenia with degenerative changes of the spine. Multilevel thoracic old compression fractures and anterior wedging and vertebroplasty. Median sternotomy wires. Partially visualized lower thoracic posterior fusion. No acute osseous pathology. Review of the MIP images confirms the above findings. IMPRESSION: 1. No CT evidence of pulmonary artery embolus. 2. Cardiomegaly with findings of CHF and small bilateral pleural effusions. Pneumonia is not excluded. Clinical correlation is recommended. 3. Dilatation of the main pulmonary trunk suggestive of pulmonary hypertension. Clinical correlation is recommended. 4. Aortic Atherosclerosis (ICD10-I70.0). Electronically Signed   By: Anner Crete M.D.   On: 11/12/2019 15:34    EKG:    IMPRESSION AND PLAN:   Drae Mitzel  is a 79 y.o. 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 [name], depression, [name], coronary artery disease comes to the emergency room after he started coughing up blood yesterday.   *Acute hypoxic respiratory failure secondary to pulmonary edema/acute diastolic congestive heart failure with known history of pulmonary hypertension -admit to progressive cardiac unit -IV Lasix 20 mg BID -patient received 60 mg Lasix in the ER -Reds clip reading -monitor input output, daily weights -watch for hemoptysis -cardiology consultation with [facility] cardiology. Dr. Haroldine Laws has been informed. -Continue lisinopril watch creatinine. -Holding amlodipine -I have not ordered the echo that was done last year.  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    Fritzi Mandes M.D  [facility]     CC: Primary care physician; [name], MD

## 2019-11-12 NOTE — ED Notes (Signed)
Pt given snacks to eat.

## 2019-11-12 NOTE — ED Provider Notes (Signed)
[facility] Emergency Department Provider Note  Time seen: 2:28 PM  I have reviewed the triage vital signs and the nursing notes.   HISTORY  Chief Complaint Hemoptysis   HPI Bryan [name] is a 79 y.o. male with a past medical history of anemia, anxiety, gastric reflux, hypertension, hyperlipidemia, paroxysmal atrial fibrillation, aortic valve replacement, on Eliquis, presents to the emergency department for hemoptysis.  According to the patient since last night he has been coughing and feeling weak.  States starting last night he began seeing blood in his sputum.  Patient denies any chest pain.  Denies any fever.  Denies any recent leg pain or swelling.  Patient does take Eliquis.   Past Medical History:  Diagnosis Date  . Abnormal CT scan    Asymmetric left rectal wall thickening   . Anemia   . Anxiety   . Arrhythmia   . Chronic lower back pain   . Complication of anesthesia    Memory loss 09/2015  . Coronary artery disease   . Depression   . GERD (gastroesophageal reflux disease)   . Glaucoma   . H/O thoracic aortic aneurysm repair   . Headache   . Heart murmur   . History of being hospitalized    memory lose kidney funtion down blood pressure up  . History of blood transfusion    "think he had one when he had heart valve OR" (10/14/2016); "none since" (10/19/2017)  . History of chicken pox   . Hyperlipidemia   . Hypertension   . Lumbar stenosis   . Murmur   . Neuropathy   . Osteoarthritis    worse in feet and ankles  . Paroxysmal A-fib ([place])   . Poor short term memory    takes Aricept  . Rheumatoid arthritis ([place])    "all over" (10/14/2016)  . Schizophrenia ([place])   . Seasonal allergies   . Thoracic spine pain   . Valvular heart disease   . Wears glasses    reading    Patient Active Problem List   Diagnosis Date Noted  . Painful orthopaedic hardware ([place]) 11/02/2019  . Osteoporosis 01/26/2019  . Right inguinal hernia 04/18/2018   . Zinc deficiency 01/27/2018  . Abnormal CT scan of lung 12/06/2017  . Low back pain 12/05/2017  . Other fatigue   . Anemia   . AKI (acute kidney injury) ([place])   . Fracture lumbar vertebra-closed ([place]) 10/20/2017  . Pseudoarthrosis of lumbar spine 09/28/2017  . Anxiety 06/24/2017  . Lumbar burst fracture, closed, initial encounter ([employer]) 06/08/2017  . Conjunctivitis, bacterial 03/22/2017  . Urge incontinence 12/15/2016  . Ataxia 11/09/2016  . Insomnia 11/09/2016  . Transient memory loss 11/09/2016  . Nausea and vomiting 10/15/2016  . Spinal stenosis of lumbar region 10/14/2016  . Leukopenia 09/27/2016  . Thrombocytopenia ([place]) 09/27/2016  . Other pancytopenia ([place]) 09/27/2016  . 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 History of left knee replacement 09/01/2016  . Fall 09/01/2016  . HNP (herniated nucleus pulposus with myelopathy), thoracic 03/08/2016  . Rectal nodule 02/23/2016  . DDD (degenerative disc disease), lumbar 01/02/2016  . Lumbar radiculopathy 01/02/2016  . Facet syndrome, lumbar 01/02/2016  . Greater trochanteric bursitis 11/28/2015  . Acute renal failure ([place]) 11/06/2015  . Dehydration 10/17/2015  . IBS (irritable bowel syndrome) 07/15/2015  . Erectile dysfunction of organic origin 07/15/2015  . Carotid stenosis 05/06/2015  . Difficulty sleeping 04/15/2015  . 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 Chronic kidney disease 02/17/2015  .  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  . Enlarged prostate with lower urinary tract symptoms (LUTS) 02/17/2015  . Rheumatoid arthritis ([place]) 10/01/2014  . Sacroiliac joint disease 10/01/2014  . Fusion of spine, sacral and sacrococcygeal region 10/01/2014  . DDD (degenerative disc disease), cervical 10/01/2014  . Lumbar and sacral osteoarthritis 10/01/2014  . Mechanical and motor problems with internal organs 06/20/2014  . Iron deficiency anemia 06/19/2014  . Symptomatic anemia  06/19/2014  . Aneurysm, ascending aorta ([place]) 05/27/2014  . Aneurysm of thoracic aorta ([place]) 05/27/2014  . Thoracic aortic aneurysm without rupture ([place]) 05/27/2014  . Cognitive decline 04/01/2014  . Spinal stenosis of lumbar region with radiculopathy 01/30/2014  . Vitamin D deficiency 01/30/2014  . Dorsalgia, unspecified 01/30/2014  . Lumbar canal stenosis 01/30/2014  . Paroxysmal atrial fibrillation ([place]) 11/27/2013  . S/P AVR (aortic valve replacement) 11/27/2013  . History of ascending aorta repair 11/27/2013  . Hyperlipidemia 11/27/2013  . Essential hypertension 11/27/2013  . Bicuspid aortic valve 11/27/2013  . Atherosclerotic heart disease of native coronary artery without angina pectoris 11/27/2013  . 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 History of open heart surgery 11/27/2013  . Carotid artery insufficiency syndrome 11/06/2010    Past Surgical History:  Procedure Laterality Date  . AORTIC VALVE REPLACEMENT  2007   [facility].  [place], [place]; "pig valve"  . APPENDECTOMY  2010  . BACK SURGERY    . CARDIAC VALVE REPLACEMENT    . CATARACT EXTRACTION W/ INTRAOCULAR LENS  IMPLANT, BILATERAL Bilateral 2012  . COLONOSCOPY WITH PROPOFOL N/A 09/24/2016   Procedure: COLONOSCOPY WITH PROPOFOL;  Surgeon: Jonathon Bellows, MD;  [facility] END[facility]COPY;  Service: Endoscopy;  Laterality: N/A;  . ESOPHAGOGASTRODUODENOSCOPY (EGD) WITH PROPOFOL N/A 09/24/2016   Procedure: ESOPHAGOGASTRODUODENOSCOPY (EGD) WITH PROPOFOL;  Surgeon: Jonathon Bellows, MD;  Location[facility] ENDOSCOPY;  Service: Endoscopy;  Laterality: N/A;  . GIVENS CAPSULE STUDY N/A 09/24/2016   Procedure: GIVENS CAPSULE STUDY;  Surgeon: Jonathon Bellows, MD;  Location: [facility] ENDOSCOPY;  Service: Endoscopy;  Laterality: N/A;  . HAMMER TOE SURGERY Right 10/09/2015   Procedure: HAMMER TOE REPAIR WITH K-WIRE FIXATION RIGHT SECOND TOE;  Surgeon: Albertine Patricia, DPM;  Location: [place];  Service: Podiatry;  Laterality:  Right;  WITH LOCAL  . HARDWARE REMOVAL N/A 11/02/2019   Procedure: Removal of hardware thoracic spine;  Surgeon: Kary Kos, MD;  Location: [place];  Service: Neurosurgery;  Laterality: N/A;  posterior  . INGUINAL HERNIA REPAIR Right 05/05/2018   Medium Bard PerFix plug.  Surgeon: Robert Bellow, MD;  Location: [facility] ORS;  Service: General;  Laterality: Right;  . IR VERTEBROPLASTY CERV/THOR BX INC UNI/BIL INC/INJECT/IMAGING  10/27/2018  . JOINT REPLACEMENT Left    left total knee  . LAMINECTOMY WITH POSTERIOR LATERAL ARTHRODESIS LEVEL 3 N/A 09/28/2017   Procedure: LUMBAR  POSTEROR  FUSION REVISION - LUMBAR ONE -TWO, LUMBAR TWO -THREE,THREE-FOUR ,BILATERAL ARTHRODESIS REMOVAL LUMBAR THREE HARDWARE;  Surgeon: Kary Kos, MD;  Location: [place];  Service: Neurosurgery;  Laterality: N/A;  . LAMINECTOMY WITH POSTERIOR LATERAL ARTHRODESIS LEVEL 3 N/A 10/20/2017   Procedure: Revision fusion and removal of hardware Lumbar one, Pedicle screw fixation thoracic ten-lumbar two, thoracic nine-ten laminotomy, Posterior Lumbar Arthrodesis thoracic ten-lumbar two ;  Surgeon: Kary Kos, MD;  Location: [place];  Service: Neurosurgery;  Laterality: N/A;  . LAPAROSCOPIC CHOLECYSTECTOMY  2010  . LUMBAR FUSION Right 06/08/2017   LUMBAR THREE-FOUR, LUMBAR FOUR-FIVE POSTEROLATERAL ARTHRODESIS WITH RIGHT LUMBAR FOUR-FIVE LAMINECTOMY/FORAMINOTOMY  .  LUMBAR LAMINECTOMY/DECOMPRESSION MICRODISCECTOMY Right 03/08/2016   Procedure: Laminectomy and Foraminotomy - Lumbar Five-Sacral One Right;  Surgeon: Kary Kos, MD;  Location: [place];  Service: Neurosurgery;  Laterality: Right;  Right  . MULTIPLE TOOTH EXTRACTIONS     "3"  . POSTERIOR LUMBAR FUSION  10/14/2016   L5-S1  . REPLACEMENT TOTAL KNEE Left 2003  . THORACIC AORTIC ANEURYSM REPAIR  2010    Prior to Admission medications   Medication Sig Start Date End Date Taking? Authorizing Provider  amLODipine (NORVASC) 5 MG tablet Take 1 tablet (5 mg) by mouth once daily Patient  taking differently: Take 5 mg by mouth daily.  03/05/19   Minna Merritts, MD  apixaban (ELIQUIS) 5 MG TABS tablet Take 1 tablet (5 mg total) by mouth 2 (two) times daily. 04/12/19   Minna Merritts, MD  aspirin EC 81 MG tablet Take 1 tablet (81 mg total) by mouth daily. Patient taking differently: Take 81 mg by mouth daily with supper.  03/05/19   Minna Merritts, MD  atorvastatin (LIPITOR) 40 MG tablet TAKE ONE TABLET BY MOUTH EVERY DAY Patient taking differently: Take 40 mg by mouth at bedtime.  08/27/19   [name], MD  calcium carbonate (OS-CAL - DOSED IN MG OF ELEMENTAL CALCIUM) 1250 (500 Ca) MG tablet Take 1 tablet by mouth daily with lunch.    [provider]  Carboxymethylcellul-Glycerin (LUBRICATING EYE DROPS OP) Place 1 drop into both eyes daily as needed (dry eyes).    [provider]  cetirizine (ZYRTEC) 10 MG tablet Take 10 mg by mouth daily.    [provider]  diphenhydrAMINE (BENADRYL) 25 MG tablet Take 25-50 mg by mouth at bedtime as needed for sleep.    [provider]  docusate sodium (COLACE) 100 MG capsule Take 200 mg by mouth at bedtime.    [provider]  fenofibrate 160 MG tablet TAKE ONE TABLET AT BEDTIME Patient taking differently: Take 160 mg by mouth at bedtime.  09/24/19   Minna Merritts, MD  FLUoxetine (PROZAC) 20 MG capsule TAKE 1 CAPSULE BY MOUTH ONCE DAILY Patient taking differently: Take 20 mg by mouth daily.  10/18/19   [name], MD  folic acid (FOLVITE) 1 MG tablet Take 1 mg by mouth in the morning and at bedtime.  07/23/19   [provider]  gabapentin (NEURONTIN) 300 MG capsule Take 300 mg by mouth 2 (two) times daily.    [provider]  HYDROcodone-acetaminophen (NORCO) 10-325 MG tablet Take 1 tablet by mouth 4 (four) times daily as needed for moderate pain.     [provider]  hydrocortisone cream 1 % Apply 1 application topically daily as needed for itching.     [provider]  hydroxychloroquine (PLAQUENIL) 200 MG tablet Take 200 mg by mouth 3 (three) times a week. Tue, Thurs, and Sat    [provider]  lisinopril (ZESTRIL) 20 MG tablet Take 1 tablet (20 mg total) by mouth every evening. Patient taking differently: Take 20 mg by mouth daily with supper.  10/19/19   Minna Merritts, MD  Multiple Vitamins-Minerals (PRESERVISION AREDS 2) CAPS Take 1 capsule by mouth 2 (two) times daily.    [provider]  omeprazole (PRILOSEC) 20 MG capsule TAKE 1 CAPSULE EVERY DAY 10/29/19   [name], MD  Omeprazole 20 MG TBEC Take 1 tablet (20 mg total) by mouth daily. Patient taking differently: Take 1 tablet by mouth daily  with lunch.  07/18/18   Renato Shin, MD  testosterone cypionate (DEPOTESTOSTERONE CYPIONATE) 200 MG/ML injection Inject 0.25 cc every 2 weeks Patient taking differently: Inject 100 mg into the muscle every 14 (fourteen) days.  04/13/19   Zara Council A, PA-C  vitamin B-12 (CYANOCOBALAMIN) 1000 MCG tablet Take 1,000 mcg by mouth daily with lunch.     [provider]  vitamin E 180 MG (400 UNITS) capsule Take 800 Units by mouth daily with lunch.    [provider]    Allergies  Allergen Reactions  . Penicillins Hives, Swelling and Other (See Comments)    SWELLING REACTION UNSPECIFIED PATIENT HAS TAKEN AMOXICILLIN ON MED HX FROM [facility] PATIENT HAS HAD A PCN REACTION WITH IMMEDIATE RASH, FACIAL/TONGUE/THROAT SWELLING, SOB, OR LIGHTHEADEDNESS WITH HYPOTENSION:  #  #  #  YES  #  #  #   Has patient had a PCN reaction causing severe rash involving mucus membranes or skin necrosis: No Has patient had a PCN reaction that required hospitalization No Has patient had a PCN reaction occurring within the last 10 years: No  . Demerol [Meperidine] Hives and Nausea And Vomiting    Family History  Problem Relation Age of Onset  . Heart failure Mother   . Hypertension Mother   . Asthma Mother   .  Osteoporosis Mother   . Heart attack Father 7       MI  . Hypertension Sister   . Prostate cancer Neg Hx   . Bladder Cancer Neg Hx   . Kidney cancer Neg Hx   . Colon cancer Neg Hx     Social History Social History   Tobacco Use  . Smoking status: Former Smoker    Packs/day: 1.00    Years: 32.00    Pack years: 32.00    Types: Cigarettes    Quit date: 07/05/1981    Years since quitting: 38.3  . Smokeless tobacco: Never Used  Vaping Use  . Vaping Use: Never used  Substance Use Topics  . Alcohol use: Yes    Comment: "glass of wine/month; if that"  . Drug use: Never    Review of Systems Constitutional: Negative for fever. Cardiovascular: Negative for chest pain. Respiratory: Negative for shortness of breath.  Positive for cough with bloody sputum. Gastrointestinal: Negative for abdominal pain Musculoskeletal: Negative for musculoskeletal complaints Neurological: Negative for headache All other ROS negative  ____________________________________________   PHYSICAL EXAM:  VITAL SIGNS: ED Triage Vitals  Enc Vitals Group     BP 11/12/19 1100 105/61     Pulse Rate 11/12/19 1100 71     Resp 11/12/19 1100 18     Temp 11/12/19 1105 98.4 F (36.9 C)     Temp Source 11/12/19 1100 Oral     SpO2 11/12/19 1100 91 %     Weight 11/12/19 1100 161 lb (73 kg)     Height 11/12/19 1100 5' 11.5" (1.816 m)     Head Circumference --      Peak Flow --      Pain Score 11/12/19 1100 0     Pain Loc --      Pain Edu? --      Excl. in [place]? --    Constitutional: Alert and oriented. Well appearing and in no distress. Eyes: Normal exam ENT      Head: Normocephalic and atraumatic.      Mouth/Throat: Mucous membranes are moist. Cardiovascular: Normal rate, regular rhythm.  Respiratory: Normal respiratory effort  without tachypnea nor retractions. Breath sounds are clear  Gastrointestinal: Soft and nontender. No distention. Musculoskeletal: Nontender with normal range of motion in all  extremities. No lower extremity tenderness or edema. Neurologic:  Normal speech and language. No gross focal neurologic deficits Skin:  Skin is warm, dry and intact.  Psychiatric: Mood and affect are normal.   ____________________________________________   RADIOLOGY  Chest x-ray shows symmetric bilateral airspace disease could be pneumonia versus pulmonary edema.  ____________________________________________   INITIAL IMPRESSION / ASSESSMENT AND PLAN / ED COURSE  Pertinent labs & imaging results that were available during my care of the patient were reviewed by me and considered in my medical decision making (see chart for details).   Patient presents to the emergency department with cough and weakness since last night now with blood within his sputum.  Patient is on Eliquis for paroxysmal atrial fibrillation as well as an aortic valve replacement.  Currently patient appears well, denies any chest pain at any point.  Denies any fever.  No lower extremity edema or discomfort.  Patient's labs today show a three-point reduction in hemoglobin over the past 10 days.  Rectal exam shows light brown stool guaiac negative.  Patient states a history of anemia in the past however in reviewing the patient's records over the past 1 year they have been greater than 10 until today.  Patient has clear lung sounds bilaterally, however satting 91% currently on room air with no baseline O2 requirement.  We will check a CT scan of the chest to rule out pulmonary embolism and to better differentiate opacities seen on x-ray.  Patient will require admission to the hospitalist once his ER work-up is been completed given his three-point reduction in hemoglobin as well as hemoptysis on anticoagulation.  CTA is most consistent with pulmonary edema.  Patient's lab work would also support this given the slight troponin elevation normal white blood cell count no other infectious signs such as fever.  Patient does desat into  the 80s at times on room air placed on 2 L he is satting in the mid upper 90s.  We will start the patient on Lasix.  We will admit to the hospitalist service as the patient will likely need to be off his anticoagulation.  Patient agreeable to plan of care.  Bryan [name] was evaluated in Emergency Department on 11/12/2019 for the symptoms described in the history of present illness. He was evaluated in the context of the global COVID-19 pandemic, which necessitated consideration that the patient might be at risk for infection with the SARS-CoV-2 virus that causes COVID-19. Institutional protocols and algorithms that pertain to the evaluation of patients at risk for COVID-19 are in a state of rapid change based on information released by regulatory bodies including the CDC and federal and state organizations. These policies and algorithms were followed during the patient's care in the ED.  ____________________________________________   FINAL CLINICAL IMPRESSION(S) / ED DIAGNOSES  Hemoptysis Pulmonary edema   Harvest Dark, MD 11/12/19 [id number]

## 2019-11-13 ENCOUNTER — Inpatient Hospital Stay (HOSPITAL_COMMUNITY)
Admit: 2019-11-13 | Discharge: 2019-11-13 | Disposition: A | Discharge status: Home / Self Care | Payer: Medicare Other | Attending: Physician Assistant | Admitting: Physician Assistant

## 2019-11-13 DIAGNOSIS — I272 Pulmonary hypertension, unspecified: Secondary | ICD-10-CM | POA: Diagnosis present

## 2019-11-13 DIAGNOSIS — I361 Nonrheumatic tricuspid (valve) insufficiency: Secondary | ICD-10-CM | POA: Diagnosis not present

## 2019-11-13 DIAGNOSIS — I350 Nonrheumatic aortic (valve) stenosis: Secondary | ICD-10-CM

## 2019-11-13 DIAGNOSIS — Z20822 Contact with and (suspected) exposure to covid-19: Secondary | ICD-10-CM | POA: Diagnosis present

## 2019-11-13 DIAGNOSIS — Z953 Presence of xenogenic heart valve: Secondary | ICD-10-CM | POA: Diagnosis not present

## 2019-11-13 DIAGNOSIS — E785 Hyperlipidemia, unspecified: Secondary | ICD-10-CM | POA: Diagnosis present

## 2019-11-13 DIAGNOSIS — Z961 Presence of intraocular lens: Secondary | ICD-10-CM | POA: Diagnosis present

## 2019-11-13 DIAGNOSIS — H409 Unspecified glaucoma: Secondary | ICD-10-CM | POA: Diagnosis present

## 2019-11-13 DIAGNOSIS — R042 Hemoptysis: Secondary | ICD-10-CM | POA: Diagnosis present

## 2019-11-13 DIAGNOSIS — M069 Rheumatoid arthritis, unspecified: Secondary | ICD-10-CM | POA: Diagnosis present

## 2019-11-13 DIAGNOSIS — I2510 Atherosclerotic heart disease of native coronary artery without angina pectoris: Secondary | ICD-10-CM | POA: Diagnosis present

## 2019-11-13 DIAGNOSIS — Z880 Allergy status to penicillin: Secondary | ICD-10-CM | POA: Diagnosis not present

## 2019-11-13 DIAGNOSIS — I5031 Acute diastolic (congestive) heart failure: Secondary | ICD-10-CM | POA: Diagnosis not present

## 2019-11-13 DIAGNOSIS — J810 Acute pulmonary edema: Secondary | ICD-10-CM | POA: Diagnosis present

## 2019-11-13 DIAGNOSIS — I480 Paroxysmal atrial fibrillation: Secondary | ICD-10-CM | POA: Diagnosis present

## 2019-11-13 DIAGNOSIS — Z952 Presence of prosthetic heart valve: Secondary | ICD-10-CM | POA: Diagnosis not present

## 2019-11-13 DIAGNOSIS — Z9842 Cataract extraction status, left eye: Secondary | ICD-10-CM | POA: Diagnosis not present

## 2019-11-13 DIAGNOSIS — I5033 Acute on chronic diastolic (congestive) heart failure: Secondary | ICD-10-CM

## 2019-11-13 DIAGNOSIS — Z885 Allergy status to narcotic agent status: Secondary | ICD-10-CM | POA: Diagnosis not present

## 2019-11-13 DIAGNOSIS — Z9841 Cataract extraction status, right eye: Secondary | ICD-10-CM | POA: Diagnosis not present

## 2019-11-13 DIAGNOSIS — K219 Gastro-esophageal reflux disease without esophagitis: Secondary | ICD-10-CM | POA: Diagnosis present

## 2019-11-13 DIAGNOSIS — F419 Anxiety disorder, unspecified: Secondary | ICD-10-CM | POA: Diagnosis present

## 2019-11-13 DIAGNOSIS — I340 Nonrheumatic mitral (valve) insufficiency: Secondary | ICD-10-CM | POA: Diagnosis not present

## 2019-11-13 DIAGNOSIS — I130 Hypertensive heart and chronic kidney disease with heart failure and stage 1 through stage 4 chronic kidney disease, or unspecified chronic kidney disease: Secondary | ICD-10-CM | POA: Diagnosis present

## 2019-11-13 DIAGNOSIS — Z7901 Long term (current) use of anticoagulants: Secondary | ICD-10-CM | POA: Diagnosis not present

## 2019-11-13 DIAGNOSIS — F329 Major depressive disorder, single episode, unspecified: Secondary | ICD-10-CM | POA: Diagnosis present

## 2019-11-13 DIAGNOSIS — Z981 Arthrodesis status: Secondary | ICD-10-CM | POA: Diagnosis not present

## 2019-11-13 DIAGNOSIS — J9601 Acute respiratory failure with hypoxia: Secondary | ICD-10-CM | POA: Diagnosis present

## 2019-11-13 DIAGNOSIS — N182 Chronic kidney disease, stage 2 (mild): Secondary | ICD-10-CM | POA: Diagnosis present

## 2019-11-13 DIAGNOSIS — N179 Acute kidney failure, unspecified: Secondary | ICD-10-CM | POA: Diagnosis present

## 2019-11-13 LAB — BASIC METABOLIC PANEL WITH GFR
Anion gap: 9 (ref 5–15)
BUN: 24 mg/dL — ABNORMAL HIGH (ref 8–23)
CO2: 25 mmol/L (ref 22–32)
Calcium: 8.4 mg/dL — ABNORMAL LOW (ref 8.9–10.3)
Chloride: 101 mmol/L (ref 98–111)
Creatinine, Ser: 1.07 mg/dL (ref 0.61–1.24)
GFR calc Af Amer: >60 mL/min
GFR calc non Af Amer: >60 mL/min
Glucose, Bld: 117 mg/dL — ABNORMAL HIGH (ref 70–99)
Potassium: 3.2 mmol/L — ABNORMAL LOW (ref 3.5–5.1)
Sodium: 135 mmol/L (ref 135–145)

## 2019-11-13 LAB — HEMOGLOBIN: Hemoglobin: 8.5 g/dL — ABNORMAL LOW (ref 13.0–17.0)

## 2019-11-13 MED ORDER — APIXABAN 5 MG PO TABS
5.0000 mg | ORAL_TABLET | Freq: Two times a day (BID) | ORAL | Status: DC
  Administered 2019-11-13 – 2019-11-14 (×3): 5 mg via ORAL
  Filled 2019-11-13 (×3): qty 1

## 2019-11-13 MED ORDER — OXYCODONE HCL 5 MG PO TABS
5.0000 mg | ORAL_TABLET | Freq: Four times a day (QID) | ORAL | Status: DC | PRN
  Administered 2019-11-13: 5 mg via ORAL
  Filled 2019-11-13: qty 1

## 2019-11-13 MED ORDER — ORAL CARE MOUTH RINSE
15.0000 mL | Freq: Two times a day (BID) | OROMUCOSAL | Status: DC
  Administered 2019-11-13: 15 mL via OROMUCOSAL

## 2019-11-13 MED ORDER — METOPROLOL TARTRATE 25 MG PO TABS
12.5000 mg | ORAL_TABLET | Freq: Two times a day (BID) | ORAL | Status: DC
  Administered 2019-11-13 – 2019-11-14 (×2): 12.5 mg via ORAL
  Filled 2019-11-13 (×2): qty 1

## 2019-11-13 MED ORDER — POTASSIUM CHLORIDE CRYS ER 20 MEQ PO TBCR
40.0000 meq | EXTENDED_RELEASE_TABLET | Freq: Once | ORAL | Status: AC
  Administered 2019-11-13: 40 meq via ORAL
  Filled 2019-11-13: qty 2

## 2019-11-13 NOTE — Progress Notes (Signed)
[facility] [place] NAME: Bryan [name]    MR#:  [id number]  DATE OF BIRTH:  1941/01/04  SUBJECTIVE:   Patient feels little better today. Less shortness short of breath. Off oxygen. Good urine output. Denies any hemoptysis. REVIEW OF SYSTEMS:   Review of Systems  Constitutional: Negative for chills, fever and weight loss.  HENT: Negative for ear discharge, ear pain and nosebleeds.   Eyes: Negative for blurred vision, pain and discharge.  Respiratory: Positive for shortness of breath. Negative for sputum production, wheezing and stridor.   Cardiovascular: Negative for chest pain, palpitations, orthopnea and PND.  Gastrointestinal: Negative for abdominal pain, diarrhea, nausea and vomiting.  Genitourinary: Negative for frequency and urgency.  Musculoskeletal: Negative for back pain and joint pain.  Neurological: Positive for weakness. Negative for sensory change, speech change and focal weakness.  Psychiatric/Behavioral: Negative for depression and hallucinations. The patient is not nervous/anxious.    Tolerating Diet:yes Tolerating PT: patient ambulatory  DRUG ALLERGIES:   Allergies  Allergen Reactions  . Penicillins Hives, Swelling and Other (See Comments)    SWELLING REACTION UNSPECIFIED PATIENT HAS TAKEN AMOXICILLIN ON MED HX FROM [facility] PATIENT HAS HAD A PCN REACTION WITH IMMEDIATE RASH, FACIAL/TONGUE/THROAT SWELLING, SOB, OR LIGHTHEADEDNESS WITH HYPOTENSION:  #  #  #  YES  #  #  #   Has patient had a PCN reaction causing severe rash involving mucus membranes or skin necrosis: No Has patient had a PCN reaction that required hospitalization No Has patient had a PCN reaction occurring within the last 10 years: No  . Demerol [Meperidine] Hives and Nausea And Vomiting    VITALS:  Blood pressure (!) 147/76, pulse 79, temperature 97.6 F (36.4 C), temperature source Oral, resp. rate 18, height 5\' 11"  (1.803 m), weight 74.7 kg, SpO2  91 %.  PHYSICAL EXAMINATION:   Physical Exam  GENERAL:  79 y.o.-year-old patient lying in the bed with no acute distress.  EYES: Pupils equal, round, reactive to light and accommodation. No scleral icterus.   HEENT: Head atraumatic, normocephalic. Oropharynx and nasopharynx clear.  NECK:  Supple, no jugular venous distention. No thyroid enlargement, no tenderness.  LUNGS: decreased breath sounds bilaterally, no wheezing, rales, rhonchi. No use of accessory muscles of respiration.  CARDIOVASCULAR: S1, S2 normal. No murmurs, rubs, or gallops.  ABDOMEN: Soft, nontender, nondistended. Bowel sounds present. No organomegaly or mass.  EXTREMITIES: No cyanosis, clubbing or edema b/l.    NEUROLOGIC: Cranial nerves II through XII are intact. No focal Motor or sensory deficits b/l.   PSYCHIATRIC:  patient is alert and oriented x 3.  SKIN: No obvious rash, lesion, or ulcer.   LABORATORY PANEL:  CBC Recent Labs  Lab 11/12/19 1108 11/12/19 1108 11/13/19 0604  WBC 6.9  --   --   HGB 8.1*   < > 8.5*  HCT 25.0*  --   --   PLT 144*  --   --    < > = values in this interval not displayed.    Chemistries  Recent Labs  Lab 11/13/19 0604  NA 135  K 3.2*  CL 101  CO2 25  GLUCOSE 117*  BUN 24*  CREATININE 1.07  CALCIUM 8.4*   Cardiac Enzymes No results for input(s): TROPONINI in the last 168 hours. RADIOLOGY:  DG Chest 2 View  Result Date: 11/12/2019 CLINICAL DATA:  Cough and congestion for 2 days. EXAM: CHEST - 2 VIEW COMPARISON:  Single-view of the  chest 10/15/2016. FINDINGS: There is bilateral perihilar and upper lung zone airspace disease which is symmetric from right to left. No pneumothorax or pleural effusion. Cardiomegaly. Atherosclerosis is seen. No acute bony abnormality. Remote thoracic spine fractures and postoperative change of spinal fusion and vertebral augmentation noted. IMPRESSION: Symmetric bilateral airspace disease be secondary to pulmonary edema or pneumonia.  Cardiomegaly. Aortic Atherosclerosis (ICD10-I70.0). Electronically Signed   By: Inge Rise M.D.   On: 11/12/2019 11:30   CT Angio Chest PE W and/or Wo Contrast  Result Date: 11/12/2019 CLINICAL DATA:  79 year old male with hemoptysis. EXAM: CT ANGIOGRAPHY CHEST WITH CONTRAST TECHNIQUE: Multidetector CT imaging of the chest was performed using the standard protocol during bolus administration of intravenous contrast. Multiplanar CT image reconstructions and MIPs were obtained to evaluate the vascular anatomy. CONTRAST:  55mL OMNIPAQUE IOHEXOL 350 MG/ML SOLN COMPARISON:  Chest radiograph dated 11/12/2019 and CT dated 08/09/2016. FINDINGS: Evaluation of this exam is limited due to respiratory motion artifact. Cardiovascular: Mild cardiomegaly. No pericardial effusion. There is mild atherosclerotic calcification of the thoracic aorta. The aorta is tortuous. No aneurysmal dilatation or dissection. There is dilatation of the main pulmonary trunk suggestive of pulmonary hypertension. Clinical correlation is recommended. Evaluation of the pulmonary arteries is somewhat limited due to respiratory motion artifact. No pulmonary artery embolus identified. Mediastinum/Nodes: There is no hilar or mediastinal adenopathy. The esophagus is grossly unremarkable. No mediastinal fluid collection. Lungs/Pleura: Small bilateral pleural effusions. There is diffuse interstitial and interlobular septal prominence with patchy areas of airspace opacity primarily involving the upper lobes. Findings most consistent with edema, although pneumonia is not excluded clinical correlation is recommended. There is no pneumothorax. The central airways are patent. Upper Abdomen: Indeterminate 2.5 cm partially visualized right renal upper pole hypodense lesion, likely a cyst. Cholecystectomy. Musculoskeletal: Osteopenia with degenerative changes of the spine. Multilevel thoracic old compression fractures and anterior wedging and vertebroplasty.  Median sternotomy wires. Partially visualized lower thoracic posterior fusion. No acute osseous pathology. Review of the MIP images confirms the above findings. IMPRESSION: 1. No CT evidence of pulmonary artery embolus. 2. Cardiomegaly with findings of CHF and small bilateral pleural effusions. Pneumonia is not excluded. Clinical correlation is recommended. 3. Dilatation of the main pulmonary trunk suggestive of pulmonary hypertension. Clinical correlation is recommended. 4. Aortic Atherosclerosis (ICD10-I70.0). Electronically Signed   By: Anner Crete M.D.   On: 11/12/2019 15:34   ASSESSMENT AND PLAN:  Jaideep Pollack  is a 79 y.o. 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 [name], depression, [name], coronary artery disease comes to the emergency room after he started coughing up blood yesterday.  *Acute hypoxic respiratory failure secondary to pulmonary edema/acute diastolic congestive heart failure with known history of pulmonary hypertension -admit to progressive cardiac unit -IV Lasix 20 mg BID -UOP so far 3000 cc -patient received 60 mg Lasix in the ER -Reds clip reading on admission 48 -watch for hemoptysis--so far hgb stable at 8.5 -cardiology consultation with [facility].  -Continue lisinopril. -Holding amlodipine -echo pending  *Hemoptysis in the setting of pulmonary edema with patient being on chronic anticoagulation with eliquis -hemoglobin dropped to 8.1--8.5 (11.3 on November 03, 2019) -will resume eliquis. Cardiology aware. 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 -continue to monitor  creatinine -baseline creatinine .9 -came in with creatinine of 1.4  *DVT prophylaxis SCD   Procedures:none Family communication :pt Consults :[facility] cardiology CODE STATUS: Full DVT Prophylaxis :eliquis  Status is: Inpatient  Remains inpatient appropriate because:IV treatments appropriate due to intensity of illness or inability to take PO and Inpatient level of care appropriate due to severity of illness   Dispo: The patient is from: Home              Anticipated d/c is to: Home              Anticipated d/c date is: 2 days              Patient currently is not medically stable to d/c.        TOTAL TIME TAKING CARE OF THIS PATIENT: 35 minutes.  >50% time spent on counselling and coordination of care  Note: This dictation was prepared with Dragon dictation along with smaller phrase technology. Any transcriptional errors that result from this process are unintentional.  Fritzi Mandes M.D    [facility]   CC: Primary care physician; [name], MDPatient ID: [name], male   DOB: 08-10-1940, 79 y.o.   MRN: [id number]

## 2019-11-13 NOTE — Consult Note (Signed)
Cardiology Consultation:   Patient ID: Bryan [name]; [id number]; Mar 10, 1941   Admit date: 11/12/2019 Date of Consult: 11/13/2019  Primary Care Provider: [name], MD Primary Cardiologist: [name] Primary Electrophysiologist:  None   Patient Profile:   Bryan [name] is a 79 y.o. 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 Posey Pronto.  History of Present Illness:   Bryan [name] was noted to have a bicuspid aortic valve in 2009. Subsequent cardiac cath showed mild to moderate nonobstructive CAD. He underwent dental procedure in 2010 and was noted to have A. fib postoperatively. Most recent echo from 01/2019 showed an EF of 55 to 60%, indeterminate LV diastolic function parameters, normal RV systolic function with mildly enlarged RV cavity size, mildly elevated RVSP at 37.5 mmHg, severely dilated left atrium, and a mildly dilated aortic root measuring 39 mm.  He was noted to be in asymptomatic A. fib at his clinic visit on 03/05/2019. At that time aspirin was decreased to 81 mg daily and he was started on Eliquis. When he was seen in follow-up in 08/2019 he remained in asymptomatic A. fib. He underwent successful removal of hardware from his thoracic spine on 11/02/2019. In the perioperative timeframe he held his Eliquis and aspirin with the subsequently being resumed on 11/04/2019.  He was in his usual state of health until 11/12/2019 when he began to develop increased shortness of breath and cough with symptoms concerning for "congestion." With his coughing he noted some blood mixed in with his sputum. No  chest pain, lower extremity swelling, abdominal distention, palpitations, dizziness, presyncope, syncope. He has never had hemoptysis previously. Upon his arrival to [facility] he was noted to have stable BP and be in A. fib with controlled ventricular response. Chest x-ray showed symmetric bilateral airspace disease possibly secondary to pulmonary edema versus pneumonia with cardiomegaly and aortic atherosclerosis. CTA chest was negative for PE with cardiomegaly and small bilateral pleural effusions with pneumonia unable to be excluded. Dilation of the main pulmonary trunk was suggestive of pulmonary hypertension. CBC showed a hemoglobin of 8.1 which was previously noted to be 11.1 eleven days prior. BNP 461. High-sensitivity troponin 20. Initial BUN/SCr 30/1.41 improving to 24/1.07. Potassium 4.4 trending to 3.2. He was started on IV Lasix 20 mg twice daily with improvement in his symptoms. Repeat CBC today showed a stable to slightly improved hemoglobin of 8.5.  Past Medical History:  Diagnosis Date  . Abnormal CT scan    Asymmetric left rectal wall thickening   . Anemia   . Anxiety   . Arrhythmia   . Chronic lower back pain   . Complication of anesthesia    Memory loss 09/2015  . Coronary artery disease   . Depression   . GERD (gastroesophageal reflux disease)   . Glaucoma   . H/O thoracic aortic aneurysm repair   . Headache   . Heart murmur   . History of being hospitalized    memory lose kidney funtion down blood pressure up  . History of blood transfusion    "think he had one when he had heart valve OR" (10/14/2016); "none since" (10/19/2017)  .  History of chicken pox   . Hyperlipidemia   . Hypertension   . Lumbar stenosis   . Murmur   . Neuropathy   . Osteoarthritis    worse in feet and ankles  . Paroxysmal A-fib ([place])   . Poor short term memory    takes Aricept  . Rheumatoid arthritis ([place])    "all over" (10/14/2016)  . Schizophrenia ([place])   . Seasonal allergies   . Thoracic  spine pain   . Valvular heart disease   . Wears glasses    reading    Past Surgical History:  Procedure Laterality Date  . AORTIC VALVE REPLACEMENT  2007   [facility].  [place], [place]; "pig valve"  . APPENDECTOMY  2010  . BACK SURGERY    . CARDIAC VALVE REPLACEMENT    . CATARACT EXTRACTION W/ INTRAOCULAR LENS  IMPLANT, BILATERAL Bilateral 2012  . COLONOSCOPY WITH PROPOFOL N/A 09/24/2016   Procedure: COLONOSCOPY WITH PROPOFOL;  Surgeon: Jonathon Bellows, MD;  Location: [facility] ENDOSCOPY;  Service: Endoscopy;  Laterality: N/A;  . ESOPHAGOGASTRODUODENOSCOPY (EGD) WITH PROPOFOL N/A 09/24/2016   Procedure: ESOPHAGOGASTRODUODENOSCOPY (EGD) WITH PROPOFOL;  Surgeon: Jonathon Bellows, MD;  Location: [facility] ENDOSCOPY;  Service: Endoscopy;  Laterality: N/A;  . GIVENS CAPSULE STUDY N/A 09/24/2016   Procedure: GIVENS CAPSULE STUDY;  Surgeon: Jonathon Bellows, MD;  Location: [facility] ENDOSCOPY;  Service: Endoscopy;  Laterality: N/A;  . HAMMER TOE SURGERY Right 10/09/2015   Procedure: HAMMER TOE REPAIR WITH K-WIRE FIXATION RIGHT SECOND TOE;  Surgeon: Albertine Patricia, DPM;  Location: [place];  Service: Podiatry;  Laterality: Right;  WITH LOCAL  . HARDWARE REMOVAL N/A 11/02/2019   Procedure: Removal of hardware thoracic spine;  Surgeon: Kary Kos, MD;  Location: [place];  Service: Neurosurgery;  Laterality: N/A;  posterior  . INGUINAL HERNIA REPAIR Right 05/05/2018   Medium Bard PerFix plug.  Surgeon: Robert Bellow, MD;  Location: [facility] ORS;  Service: General;  Laterality: Right;  . IR VERTEBROPLASTY CERV/THOR BX INC UNI/BIL INC/INJECT/IMAGING  10/27/2018  . JOINT REPLACEMENT Left    left total knee  . LAMINECTOMY WITH POSTERIOR LATERAL ARTHRODESIS LEVEL 3 N/A 09/28/2017   Procedure: LUMBAR  POSTEROR  FUSION REVISION - LUMBAR ONE -TWO, LUMBAR TWO -THREE,THREE-FOUR ,BILATERAL ARTHRODESIS REMOVAL LUMBAR THREE HARDWARE;  Surgeon: Kary Kos, MD;  Location: [place];  Service: Neurosurgery;  Laterality: N/A;  . LAMINECTOMY  WITH POSTERIOR LATERAL ARTHRODESIS LEVEL 3 N/A 10/20/2017   Procedure: Revision fusion and removal of hardware Lumbar one, Pedicle screw fixation thoracic ten-lumbar two, thoracic nine-ten laminotomy, Posterior Lumbar Arthrodesis thoracic ten-lumbar two ;  Surgeon: Kary Kos, MD;  Location: [place];  Service: Neurosurgery;  Laterality: N/A;  . LAPAROSCOPIC CHOLECYSTECTOMY  2010  . LUMBAR FUSION Right 06/08/2017   LUMBAR THREE-FOUR, LUMBAR FOUR-FIVE POSTEROLATERAL ARTHRODESIS WITH RIGHT LUMBAR FOUR-FIVE LAMINECTOMY/FORAMINOTOMY  . LUMBAR LAMINECTOMY/DECOMPRESSION MICRODISCECTOMY Right 03/08/2016   Procedure: Laminectomy and Foraminotomy - Lumbar Five-Sacral One Right;  Surgeon: Kary Kos, MD;  Location: [place];  Service: Neurosurgery;  Laterality: Right;  Right  . MULTIPLE TOOTH EXTRACTIONS     "3"  . POSTERIOR LUMBAR FUSION  10/14/2016   L5-S1  . REPLACEMENT TOTAL KNEE Left 2003  . THORACIC AORTIC ANEURYSM REPAIR  2010     Home Meds: Prior to Admission medications   Medication Sig Start Date End Date Taking? Authorizing Provider  amLODipine (NORVASC) 5 MG tablet Take 1 tablet (5 mg) by mouth once daily Patient taking differently: Take 5 mg by mouth  daily.  03/05/19  Yes Gollan, Kathlene November, MD  apixaban (ELIQUIS) 5 MG TABS tablet Take 1 tablet (5 mg total) by mouth 2 (two) times daily. 04/12/19  Yes Minna Merritts, MD  aspirin EC 81 MG tablet Take 1 tablet (81 mg total) by mouth daily. Patient taking differently: Take 81 mg by mouth daily with supper.  03/05/19  Yes Gollan, Kathlene November, MD  atorvastatin (LIPITOR) 40 MG tablet TAKE ONE TABLET BY MOUTH EVERY DAY Patient taking differently: Take 40 mg by mouth at bedtime.  08/27/19  Yes [name], MD  calcium carbonate (OS-CAL - DOSED IN MG OF ELEMENTAL CALCIUM) 1250 (500 Ca) MG tablet Take 1 tablet by mouth daily with lunch.   Yes [provider]  cetirizine (ZYRTEC) 10 MG tablet Take 10 mg by mouth daily.   Yes [provider]  diphenhydrAMINE (BENADRYL) 25 MG tablet Take 25-50 mg by mouth at bedtime as needed for sleep.   Yes [provider]  docusate sodium (COLACE) 100 MG capsule Take 200 mg by mouth at bedtime.   Yes [provider]  fenofibrate 160 MG tablet TAKE ONE TABLET AT BEDTIME Patient taking differently: Take 160 mg by mouth at bedtime.  09/24/19  Yes Gollan, Kathlene November, MD  FLUoxetine (PROZAC) 20 MG capsule TAKE 1 CAPSULE BY MOUTH ONCE DAILY Patient taking differently: Take 20 mg by mouth daily.  10/18/19  Yes [name], MD  folic acid (FOLVITE) 1 MG tablet Take 1 mg by mouth in the morning and at bedtime.  07/23/19  Yes [provider]  gabapentin (NEURONTIN) 300 MG capsule Take 300 mg by mouth 2 (two) times daily.   Yes [provider]  HYDROcodone-acetaminophen (NORCO) 10-325 MG tablet Take 1 tablet by mouth 4 (four) times daily as needed for moderate pain.    Yes [provider]  hydrocortisone cream 1 % Apply 1 application topically daily as needed for itching.   Yes [provider]  hydroxychloroquine (PLAQUENIL) 200 MG tablet Take 200 mg by mouth 3 (three) times a week. Tue, Thurs, and Sat   Yes [provider]  lisinopril (ZESTRIL) 20 MG tablet Take 1 tablet (20 mg total) by mouth every evening. Patient taking differently: Take 20 mg by mouth daily with supper.  10/19/19  Yes Gollan, Kathlene November, MD  Multiple Vitamins-Minerals (PRESERVISION AREDS 2) CAPS Take 1 capsule by mouth 2 (two) times daily.   Yes [provider]  Omeprazole 20 MG TBEC Take 1 tablet (20 mg total) by mouth daily. Patient taking differently: Take 1 tablet by mouth daily with lunch.  07/18/18  Yes Renato Shin, MD  vitamin B-12 (CYANOCOBALAMIN) 1000 MCG tablet Take 1,000 mcg by mouth daily with lunch.    Yes [provider]  vitamin E 180 MG (400 UNITS) capsule Take 800 Units by mouth daily with lunch.   Yes [provider]    Carboxymethylcellul-Glycerin (LUBRICATING EYE DROPS OP) Place 1 drop into both eyes daily as needed (dry eyes).    [provider]  testosterone cypionate (DEPOTESTOSTERONE CYPIONATE) 200 MG/ML injection Inject 0.25 cc every 2 weeks Patient taking differently: Inject 100 mg into the muscle every 14 (fourteen) days.  04/13/19   Nori Riis, PA-C    Inpatient Medications: Scheduled Meds: . atorvastatin  40 mg Oral QHS  . calcium carbonate  2.5 tablet Oral Q lunch  . docusate sodium  200 mg Oral QHS  . fenofibrate  160  mg Oral QHS  . FLUoxetine  20 mg Oral Daily  . folic acid  1 mg Oral Daily  . furosemide  20 mg Intravenous BID  . gabapentin  300 mg Oral BID  . hydroxychloroquine  200 mg Oral Once per day on Tue Thu Sat  . lisinopril  20 mg Oral Q supper  . multivitamin-lutein  1 capsule Oral BID  . pantoprazole  40 mg Oral Daily  . sodium chloride flush  3 mL Intravenous Q12H  . vitamin B-12  1,000 mcg Oral Q lunch  . vitamin E  800 Units Oral Q lunch   Continuous Infusions: . sodium chloride     PRN Meds: sodium chloride, acetaminophen, ondansetron (ZOFRAN) IV, oxyCODONE, polyvinyl alcohol, sodium chloride flush  Allergies:   Allergies  Allergen Reactions  . Penicillins Hives, Swelling and Other (See Comments)    SWELLING REACTION UNSPECIFIED PATIENT HAS TAKEN AMOXICILLIN ON MED HX FROM [facility] PATIENT HAS HAD A PCN REACTION WITH IMMEDIATE RASH, FACIAL/TONGUE/THROAT SWELLING, SOB, OR LIGHTHEADEDNESS WITH HYPOTENSION:  #  #  #  YES  #  #  #   Has patient had a PCN reaction causing severe rash involving mucus membranes or skin necrosis: No Has patient had a PCN reaction that required hospitalization No Has patient had a PCN reaction occurring within the last 10 years: No  . Demerol [Meperidine] Hives and Nausea And Vomiting    Social History:   Social History   Socioeconomic History  . Marital status: Widowed    Spouse name: Not on file  . Number of  children: Not on file  . Years of education: Not on file  . Highest education level: Not on file  Occupational History  . Occupation: retired  Tobacco Use  . Smoking status: Former Smoker    Packs/day: 1.00    Years: 32.00    Pack years: 32.00    Types: Cigarettes    Quit date: 07/05/1981    Years since quitting: 38.3  . Smokeless tobacco: Never Used  Vaping Use  . Vaping Use: Never used  Substance and Sexual Activity  . Alcohol use: Yes    Comment: "glass of wine/month; if that"  . Drug use: Never  . Sexual activity: Not Currently  Other Topics Concern  . Not on file  Social History Narrative  . Not on file   Social Determinants of Health   Financial Resource Strain: Low Risk   . Difficulty of Paying Living Expenses: Not hard at all  Food Insecurity: No Food Insecurity  . Worried About Charity fundraiser in the Last Year: Never true  . Ran Out of Food in the Last Year: Never true  Transportation Needs: No Transportation Needs  . Lack of Transportation (Medical): No  . Lack of Transportation (Non-Medical): No  Physical Activity:   . Days of Exercise per Week:   . Minutes of Exercise per Session:   Stress: No Stress Concern Present  . Feeling of Stress : Not at all  Social Connections:   . Frequency of Communication with Friends and Family:   . Frequency of Social Gatherings with Friends and Family:   . Attends Religious Services:   . Active Member of Clubs or Organizations:   . Attends Archivist Meetings:   Marland Kitchen Marital Status:   Intimate Partner Violence:   . Fear of Current or Ex-Partner:   . Emotionally Abused:   Marland Kitchen Physically Abused:   . Sexually Abused:  Family History:   Family History  Problem Relation Age of Onset  . Heart failure Mother   . Hypertension Mother   . Asthma Mother   . Osteoporosis Mother   . Heart attack Father 28       MI  . Hypertension Sister   . Prostate cancer Neg Hx   . Bladder Cancer Neg Hx   . Kidney cancer  Neg Hx   . Colon cancer Neg Hx     ROS:  Review of Systems  Constitutional: Positive for malaise/fatigue. Negative for chills, diaphoresis, fever and weight loss.  HENT: Negative for congestion.   Eyes: Negative for discharge and redness.  Respiratory: Positive for cough, hemoptysis, sputum production and shortness of breath. Negative for wheezing.   Cardiovascular: Negative for chest pain, palpitations, orthopnea, claudication, leg swelling and PND.  Gastrointestinal: Negative for abdominal pain, heartburn, nausea and vomiting.  Musculoskeletal: Negative for falls and myalgias.  Skin: Negative for rash.  Neurological: Positive for weakness. Negative for dizziness, tingling, tremors, sensory change, speech change, focal weakness and loss of consciousness.  Endo/Heme/Allergies: Does not bruise/bleed easily.  Psychiatric/Behavioral: Negative for substance abuse. The patient is not nervous/anxious.   All other systems reviewed and are negative.     Physical Exam/Data:   Vitals:   11/13/19 0600 11/13/19 0800 11/13/19 0803 11/13/19 1152  BP: (!) 149/76  (!) 150/76 (!) 147/76  Pulse: 72  78 79  Resp:   18 18  Temp: 97.8 F (36.6 C)  (!) 97.3 F (36.3 C) 97.6 F (36.4 C)  TempSrc: Oral  Oral Oral  SpO2: 95%  93% 91%  Weight:  74.7 kg    Height:  5\' 11"  (1.803 m)      Intake/Output Summary (Last 24 hours) at 11/13/2019 1321 Last data filed at 11/13/2019 1150 Gross per 24 hour  Intake 600 ml  Output 2700 ml  Net -2100 ml   Filed Weights   11/12/19 1100 11/13/19 0800  Weight: 73 kg 74.7 kg   Body mass index is 22.97 kg/m.   Physical Exam: General: Well developed, well nourished, in no acute distress. Head: Normocephalic, atraumatic, sclera non-icteric, no xanthomas, nares without discharge.  Neck: Negative for carotid bruits. JVD not elevated. Lungs: Clear bilaterally to auscultation without wheezes, rales, or rhonchi. Breathing is unlabored. Heart: Irregularly irregular  with S1 S2. No murmurs, rubs, or gallops appreciated. Abdomen: Soft, non-tender, non-distended with normoactive bowel sounds. No hepatomegaly. No rebound/guarding. No obvious abdominal masses. Msk:  Strength and tone appear normal for age. Extremities: No clubbing or cyanosis. No edema. Distal pedal pulses are 2+ and equal bilaterally. Neuro: Alert and oriented X 3. No facial asymmetry. No focal deficit. Moves all extremities spontaneously. Psych:  Responds to questions appropriately with a normal affect.   EKG:  The EKG was personally reviewed and demonstrates: No study available for review Telemetry:  Telemetry was personally reviewed and demonstrates: A. fib with ventricular rates ranging from the 70s to 1-teens bpm  Weights: Filed Weights   11/12/19 1100 11/13/19 0800  Weight: 73 kg 74.7 kg    Relevant CV Studies:  2D echo 01/2019: 1. The left ventricle has normal systolic function, with an ejection  fraction of 55-60%. The cavity size was normal. Left ventricular diastolic  Doppler parameters are indeterminate.  2. The right ventricle has normal systolic function. The cavity was  mildly enlarged. There is Right vetricular wall thickness was not  assessed. Right ventricular systolic pressure is mildly elevated with an  estimated pressure of 37.5 mmHg.  3. Left atrial size was severely dilated.  4. Right atrial size was normal.  5. The pulmonic valve was not well visualized. Pulmonic valve  regurgitation is trivial by color flow Doppler.  6. The aorta is abnormal unless otherwise noted.  7. There is mild dilatation of the aortic root measuring 39 mm.  Laboratory Data:  Chemistry Recent Labs  Lab 11/12/19 1108 11/13/19 0604  NA 138 135  K 4.4 3.2*  CL 107 101  CO2 24 25  GLUCOSE 115* 117*  BUN 30* 24*  CREATININE 1.41* 1.07  CALCIUM 8.4* 8.4*  GFRNONAA 46* >60  GFRAA 54* >60  ANIONGAP 7 9    No results for input(s): PROT, ALBUMIN, AST, ALT, ALKPHOS, BILITOT  in the last 168 hours. Hematology Recent Labs  Lab 11/12/19 1108 11/13/19 0604  WBC 6.9  --   RBC 2.58*  --   HGB 8.1* 8.5*  HCT 25.0*  --   MCV 96.9  --   MCH 31.4  --   MCHC 32.4  --   RDW 13.8  --   PLT 144*  --    Cardiac EnzymesNo results for input(s): TROPONINI in the last 168 hours. No results for input(s): TROPIPOC in the last 168 hours.  BNP Recent Labs  Lab 11/12/19 1108  BNP 461.9*    DDimer No results for input(s): DDIMER in the last 168 hours.  Radiology/Studies:  DG Chest 2 View  Result Date: 11/12/2019 IMPRESSION: Symmetric bilateral airspace disease be secondary to pulmonary edema or pneumonia. Cardiomegaly. Aortic Atherosclerosis (ICD10-I70.0). Electronically Signed   By: Inge Rise M.D.   On: 11/12/2019 11:30   CT Angio Chest PE W and/or Wo Contrast  Result Date: 11/12/2019 IMPRESSION: 1. No CT evidence of pulmonary artery embolus. 2. Cardiomegaly with findings of CHF and small bilateral pleural effusions. Pneumonia is not excluded. Clinical correlation is recommended. 3. Dilatation of the main pulmonary trunk suggestive of pulmonary hypertension. Clinical correlation is recommended. 4. Aortic Atherosclerosis (ICD10-I70.0). Electronically Signed   By: Anner Crete M.D.   On: 11/12/2019 15:34    Assessment and Plan:   1. Acute on chronic diastolic CHF/pulmonary hypertension: -Possibly precipitated in the perioperative timeframe with IV fluids -He does not appear grossly volume overloaded on exam today -It is reasonable to continue IV Lasix through today with transition to oral Lasix on 6/23 -Update echo -ReDs vest pending -Consider outpatient sleep study  -Optimal BP control  2. PAF: -He remains in rate controlled A. fib and is asymptomatic -Not currently requiring AV nodal blocking medications -He has previously declined cardioversion as he was asymptomatic -Consider addition of low-dose metoprolol as his ventricular rates have been in the  70s to 90s bpm and this will also assist with his BP control -It is reasonable to undergo a rechallenge of Eliquis with an uptrending hemoglobin noted today while he is currently admitted for observation  3. Hemoptysis: -Likely secondary to capillary rupture with coughing associated with volume overload -Improved -Reasonable to undergo reinitiation of Eliquis with improved hemoglobin as outlined above  4. AKI: -Improved  5. Bicuspid aortic valve/ascending aortic aneurysm: -Status post Bentall procedure in 2010 -Stable by echo in 2020 -Update echo -Optimal BP control recommended     For questions or updates, please contact [place] Please consult [email or web address] for contact info under Cardiology/STEMI.   Signed, Christell Faith, PA-C [place] Pager: [phone] 11/13/2019, 1:21 PM

## 2019-11-13 NOTE — Progress Notes (Signed)
Report called to [facility] for patient being transferred to room 251.

## 2019-11-14 ENCOUNTER — Telehealth: Payer: Self-pay | Admitting: Family Medicine

## 2019-11-14 DIAGNOSIS — R042 Hemoptysis: Secondary | ICD-10-CM

## 2019-11-14 LAB — ECHOCARDIOGRAM COMPLETE
Height: 71 in
Weight: 2635.2 oz

## 2019-11-14 LAB — BASIC METABOLIC PANEL
Anion gap: 8 (ref 5–15)
BUN: 25 mg/dL — ABNORMAL HIGH (ref 8–23)
CO2: 25 mmol/L (ref 22–32)
Calcium: 8.5 mg/dL — ABNORMAL LOW (ref 8.9–10.3)
Chloride: 103 mmol/L (ref 98–111)
Creatinine, Ser: 1.05 mg/dL (ref 0.61–1.24)
GFR calc Af Amer: >60 mL/min (ref >60)
GFR calc non Af Amer: >60 mL/min (ref >60)
Glucose, Bld: 151 mg/dL — ABNORMAL HIGH (ref 70–99)
Potassium: 3.6 mmol/L (ref 3.5–5.1)
Sodium: 136 mmol/L (ref 135–145)

## 2019-11-14 MED ORDER — FUROSEMIDE 40 MG PO TABS: 40.0000 mg | ORAL_TABLET | Freq: Two times a day (BID) | ORAL | Status: DC

## 2019-11-14 MED ORDER — DIPHENHYDRAMINE HCL 50 MG/ML IJ SOLN
12.5000 mg | Freq: Once | INTRAMUSCULAR | Status: AC
  Administered 2019-11-14: 12.5 mg via INTRAVENOUS
  Filled 2019-11-14: qty 1

## 2019-11-14 MED ORDER — FUROSEMIDE 40 MG PO TABS: 40.0000 mg | ORAL_TABLET | Freq: Two times a day (BID) | ORAL | 1 refills | Status: DC

## 2019-11-14 MED ORDER — METOPROLOL TARTRATE 25 MG PO TABS: 12.5000 mg | ORAL_TABLET | Freq: Two times a day (BID) | ORAL | 1 refills | Status: DC

## 2019-11-14 MED ORDER — FUROSEMIDE 10 MG/ML IJ SOLN: 40.0000 mg | Freq: Two times a day (BID) | INTRAMUSCULAR | Status: DC

## 2019-11-14 MED ORDER — ACETAMINOPHEN 325 MG PO TABS: 650.0000 mg | ORAL_TABLET | ORAL | Status: DC | PRN

## 2019-11-14 NOTE — Progress Notes (Signed)
Progress Note  Patient Name: Bryan [name] Date of Encounter: 11/14/2019  Primary Cardiologist: Ida Rogue, MD  Subjective   Feels much better this AM.  Has been back and forth to the bathroom a few times w/o dyspnea.  ReDS vest 40 this AM, down from 48 yesterday.  Would like to go home.  Inpatient Medications    Scheduled Meds: . apixaban  5 mg Oral BID  . atorvastatin  40 mg Oral QHS  . calcium carbonate  2.5 tablet Oral Q lunch  . docusate sodium  200 mg Oral QHS  . fenofibrate  160 mg Oral QHS  . FLUoxetine  20 mg Oral Daily  . folic acid  1 mg Oral Daily  . furosemide  40 mg Oral BID  . gabapentin  300 mg Oral BID  . hydroxychloroquine  200 mg Oral Once per day on Tue Thu Sat  . lisinopril  20 mg Oral Q supper  . mouth rinse  15 mL Mouth Rinse BID  . metoprolol tartrate  12.5 mg Oral BID  . multivitamin-lutein  1 capsule Oral BID  . pantoprazole  40 mg Oral Daily  . sodium chloride flush  3 mL Intravenous Q12H  . vitamin B-12  1,000 mcg Oral Q lunch  . vitamin E  800 Units Oral Q lunch   Continuous Infusions: . sodium chloride     PRN Meds: sodium chloride, acetaminophen, ondansetron (ZOFRAN) IV, oxyCODONE, polyvinyl alcohol, sodium chloride flush   Vital Signs    Vitals:   11/13/19 1918 11/14/19 0438 11/14/19 0500 11/14/19 0822  BP: 125/74 137/73  140/69  Pulse: 64 81  69  Resp: 17 16  18   Temp: 99 F (37.2 C) 98.6 F (37 C)  98.1 F (36.7 C)  TempSrc: Oral Oral  Oral  SpO2: 96% 97%  97%  Weight:   75.3 kg   Height:        Intake/Output Summary (Last 24 hours) at 11/14/2019 1102 Last data filed at 11/14/2019 0945 Gross per 24 hour  Intake 480 ml  Output 1900 ml  Net -1420 ml   Filed Weights   11/12/19 1100 11/13/19 0800 11/14/19 0500  Weight: 73 kg 74.7 kg 75.3 kg    Physical Exam   GEN: Well nourished, well developed, in no acute distress.  HEENT: Grossly normal.  Neck: Supple, no JVD, carotid bruits, or masses. Cardiac: IR,  IR, 2/6 syst murmur 2 upper sternal borders, no rubs, or gallops. No clubbing, cyanosis, edema.  Radials/DP/PT 2+ and equal bilaterally.  Respiratory:  Respirations regular and unlabored, clear to auscultation bilaterally. GI: Soft, nontender, nondistended, BS + x 4. MS: no deformity or atrophy. Skin: warm and dry, no rash. Neuro:  Strength and sensation are intact. Psych: AAOx3.  Normal affect.  Labs    Chemistry Recent Labs  Lab 11/12/19 1108 11/13/19 0604 11/14/19 0516  NA 138 135 136  K 4.4 3.2* 3.6  CL 107 101 103  CO2 24 25 25   GLUCOSE 115* 117* 151*  BUN 30* 24* 25*  CREATININE 1.41* 1.07 1.05  CALCIUM 8.4* 8.4* 8.5*  GFRNONAA 46* >60 >60  GFRAA 54* >60 >60  ANIONGAP 7 9 8      Hematology Recent Labs  Lab 11/12/19 1108 11/13/19 0604  WBC 6.9  --   RBC 2.58*  --   HGB 8.1* 8.5*  HCT 25.0*  --   MCV 96.9  --   MCH 31.4  --   MCHC 32.4  --  RDW 13.8  --   PLT 144*  --     Cardiac Enzymes  Recent Labs  Lab 11/12/19 1108  TROPONINIHS 20*      BNP Recent Labs  Lab 11/12/19 1108  BNP 461.9*    Lab Results  Component Value Date   CHOL 123 09/11/2019   HDL 58 09/11/2019   LDLCALC 47 09/11/2019   TRIG 96 09/11/2019   CHOLHDL 2.1 09/11/2019     Radiology    DG Chest 2 View  Result Date: 11/12/2019 CLINICAL DATA:  Cough and congestion for 2 days. EXAM: CHEST - 2 VIEW COMPARISON:  Single-view of the chest 10/15/2016. FINDINGS: There is bilateral perihilar and upper lung zone airspace disease which is symmetric from right to left. No pneumothorax or pleural effusion. Cardiomegaly. Atherosclerosis is seen. No acute bony abnormality. Remote thoracic spine fractures and postoperative change of spinal fusion and vertebral augmentation noted. IMPRESSION: Symmetric bilateral airspace disease be secondary to pulmonary edema or pneumonia. Cardiomegaly. Aortic Atherosclerosis (ICD10-I70.0). Electronically Signed   By: Inge Rise M.D.   On: 11/12/2019 11:30    CT Angio Chest PE W and/or Wo Contrast  Result Date: 11/12/2019 CLINICAL DATA:  79 year old male with hemoptysis. EXAM: CT ANGIOGRAPHY CHEST WITH CONTRAST TECHNIQUE: Multidetector CT imaging of the chest was performed using the standard protocol during bolus administration of intravenous contrast. Multiplanar CT image reconstructions and MIPs were obtained to evaluate the vascular anatomy. CONTRAST:  61mL OMNIPAQUE IOHEXOL 350 MG/ML SOLN COMPARISON:  Chest radiograph dated 11/12/2019 and CT dated 08/09/2016. FINDINGS: Evaluation of this exam is limited due to respiratory motion artifact. Cardiovascular: Mild cardiomegaly. No pericardial effusion. There is mild atherosclerotic calcification of the thoracic aorta. The aorta is tortuous. No aneurysmal dilatation or dissection. There is dilatation of the main pulmonary trunk suggestive of pulmonary hypertension. Clinical correlation is recommended. Evaluation of the pulmonary arteries is somewhat limited due to respiratory motion artifact. No pulmonary artery embolus identified. Mediastinum/Nodes: There is no hilar or mediastinal adenopathy. The esophagus is grossly unremarkable. No mediastinal fluid collection. Lungs/Pleura: Small bilateral pleural effusions. There is diffuse interstitial and interlobular septal prominence with patchy areas of airspace opacity primarily involving the upper lobes. Findings most consistent with edema, although pneumonia is not excluded clinical correlation is recommended. There is no pneumothorax. The central airways are patent. Upper Abdomen: Indeterminate 2.5 cm partially visualized right renal upper pole hypodense lesion, likely a cyst. Cholecystectomy. Musculoskeletal: Osteopenia with degenerative changes of the spine. Multilevel thoracic old compression fractures and anterior wedging and vertebroplasty. Median sternotomy wires. Partially visualized lower thoracic posterior fusion. No acute osseous pathology. Review of the MIP  images confirms the above findings. IMPRESSION: 1. No CT evidence of pulmonary artery embolus. 2. Cardiomegaly with findings of CHF and small bilateral pleural effusions. Pneumonia is not excluded. Clinical correlation is recommended. 3. Dilatation of the main pulmonary trunk suggestive of pulmonary hypertension. Clinical correlation is recommended. 4. Aortic Atherosclerosis (ICD10-I70.0). Electronically Signed   By: Anner Crete M.D.   On: 11/12/2019 15:34    Telemetry    Afib, 70's to 80's - Personally Reviewed  Cardiac Studies   2D Echocardiogram 6.22.2021  Prelim read: nl EF. Elevated PASP.  Patient Profile     79 y.o. 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  Assessment &  Plan    1.  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 He has diuresed well and feels much better this AM.  Minus 2.9 L for admission.  Euvolemic on exam this AM. ReDS reading improved, though still mildly elevated @ 40. Echo w/ nl EF and elevated PASP.  He has been ambulating some w/o dyspnea.  We will switch lasix to 40 po BID.  He can likely be discharged later this AM and we will arrange for early f/u next week in our office.  Suspect we may be able to de-escalate diuretic rx as outpt.  BP mildly elevated this AM but trended better yesterday. Cont  blocker and acei.  2.  S/p AVR:  Stable gradient across AoV on echo ~ 25mmHg.  3.  PAF:  This appears to be more persistent.  Good rate-control on oral  blocker therapy and would plan to cont current dose.  Eliquis resumed this AM.  Pt believes hemoptysis has resolved.  4.  Hemoptysis:  Improved since admission. Will need close outpt f/u as eliquis has been resumed.  5.  Essential HTN:  Sl elevated this AM, though  trended better yesterday.  Was on amlodipine @ home, which we d/c'd in favor of  blocker in setting of Afib.  Cont  blocker and acei.  Would not resume amlodipine @ this time.  6.  HL:  LDL 47 on statin rx.  7.  Normocytic Anemia:  Hgb stable yesterday @ 8.5.  Will need outpt f/u at office visit next week given recent hemoptysis and resumption of eliquis.  Signed, Murray Hodgkins, NP  11/14/2019, 11:02 AM    For questions or updates, please contact   Please consult [email or web address] for contact info under Cardiology/STEMI.

## 2019-11-14 NOTE — Telephone Encounter (Signed)
Patient admitted into [facility] on 11/12/19

## 2019-11-14 NOTE — TOC Progression Note (Signed)
Transition of Care [facility]) - Progression Note    Patient Details  Name: Bryan [name] MRN: [id number] Date of Birth: 09/02/1940  [facility] CM/SW [place], RN Phone Number: 11/14/2019, 1:40 PM  Clinical Narrative:    Patient is independent and able to care for his needs.  Referred this patient to Darylene Price, NP at [facility].  No further TOC needs at this time, please re-consult for new needs.         Expected Discharge Plan and Services           Expected Discharge Date: 11/14/19                                     Social Determinants of Health (SDOH) Interventions    Readmission Risk Interventions No flowsheet data found.

## 2019-11-14 NOTE — Telephone Encounter (Signed)
[place] called to schedule a HFU appt for 6/30

## 2019-11-14 NOTE — Discharge Summary (Signed)
[facility] NAME: Bryan [name]    MR#:  [id number]  DATE OF BIRTH:  10/19/1940  DATE OF ADMISSION:  11/12/2019 ADMITTING PHYSICIAN: Fritzi Mandes, MD  DATE OF DISCHARGE: 11/14/2019  PRIMARY CARE PHYSICIAN: [name], MD    ADMISSION DIAGNOSIS:  Acute pulmonary edema ([place]) [J81.0] Hemoptysis [R04.2] Acute hypoxemic respiratory failure ([place]) [J96.01]  DISCHARGE DIAGNOSIS:  Acute hypoxic respiratory failure secondary to pulmonary edema/CHF acute diastolic Hemoptysis--resolved  SECONDARY DIAGNOSIS:   Past Medical History:  Diagnosis Date  . Abnormal CT scan    Asymmetric left rectal wall thickening   . Anemia   . Anxiety   . Arrhythmia   . Chronic lower back pain   . Complication of anesthesia    Memory loss 09/2015  . Coronary artery disease   . Depression   . GERD (gastroesophageal reflux disease)   . Glaucoma   . H/O thoracic aortic aneurysm repair   . Headache   . Heart murmur   . History of being hospitalized    memory lose kidney funtion down blood pressure up  . History of blood transfusion    "think he had one when he had heart valve OR" (10/14/2016); "none since" (10/19/2017)  . History of chicken pox   . Hyperlipidemia   . Hypertension   . Lumbar stenosis   . Murmur   . Neuropathy   . Osteoarthritis    worse in feet and ankles  . Paroxysmal A-fib ([place])   . Poor short term memory    takes Aricept  . Rheumatoid arthritis ([place])    "all over" (10/14/2016)  . Schizophrenia ([place])   . Seasonal allergies   . Thoracic spine pain   . Valvular heart disease   . 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 Mike [name], depression, Bryan [name], coronary  artery disease comes to the emergency room after he started coughing up blood yesterday.  *Acute hypoxic respiratory failure secondary to pulmonary edema/acute diastolic congestive heart failure with known history of pulmonary hypertension -admit to progressive cardiac unit -IV Lasix 20 mg BID--change to po lasix 40 mg bid by cardiology -UOP so far 4300 cc -patient received 60 mg Lasix in the ER -Reds clip reading on admission 52 -Reds today 40 -watch for hemoptysis--so far hgb stable at 8.5 -sats >92% on RA -cardiology consultation with [facility] cardiology appreciated. Ok to d/c home today -Continue lisinopril. -Holding amlodipine -echo pending  *Hemoptysis in the setting of pulmonary edema with patient being on chronic anticoagulation with eliquis -hemoglobin dropped to 8.1--8.5(11.3 on November 03, 2019) - resumed eliquis.  -will  transfuse as needed -pt is to follow-up with Dr. Mike [name] as outpatient for his anemia. -No further episodes of hemoptysis.  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 CONSULTS OBTAINED:  Treatment Team:  Bensimhon, Shaune Pascal, MD End, Harrell Gave, MD  DRUG ALLERGIES:   Allergies  Allergen Reactions  . Penicillins Hives, Swelling and Other (See Comments)    SWELLING REACTION UNSPECIFIED PATIENT HAS TAKEN AMOXICILLIN ON MED HX FROM [facility] PATIENT  HAS HAD A PCN REACTION WITH IMMEDIATE RASH, FACIAL/TONGUE/THROAT SWELLING, SOB, OR LIGHTHEADEDNESS WITH HYPOTENSION:  #  #  #  YES  #  #  #   Has patient had a PCN reaction causing severe rash involving mucus membranes or skin necrosis: No Has patient had a PCN reaction that required hospitalization No Has patient had a PCN reaction occurring within the last 10 years: No  . Demerol [Meperidine] Hives and Nausea And Vomiting    DISCHARGE MEDICATIONS:   Allergies as of 11/14/2019      Reactions   Penicillins Hives, Swelling, Other (See Comments)   SWELLING REACTION UNSPECIFIED PATIENT HAS TAKEN AMOXICILLIN ON MED HX FROM [facility] PATIENT HAS HAD A PCN REACTION WITH IMMEDIATE RASH, FACIAL/TONGUE/THROAT SWELLING, SOB, OR LIGHTHEADEDNESS WITH HYPOTENSION:  #  #  #  YES  #  #  #   Has patient had a PCN reaction causing severe rash involving mucus membranes or skin necrosis: No Has patient had a PCN reaction that required hospitalization No Has patient had a PCN reaction occurring within the last 10 years: No   Demerol [meperidine] Hives, Nausea And Vomiting      Medication List    STOP taking these medications   amLODipine 5 MG tablet Commonly known as: NORVASC     TAKE these medications   apixaban 5 MG Tabs tablet Commonly known as: Eliquis Take 1 tablet (5 mg total) by mouth 2 (two) times daily.   aspirin EC 81 MG tablet Take 1 tablet (81 mg total) by mouth daily. What changed: when to take this   atorvastatin 40 MG tablet Commonly known as: LIPITOR TAKE ONE TABLET BY MOUTH EVERY DAY What changed: when to take this   calcium carbonate 1250 (500 Ca) MG tablet Commonly known as: OS-CAL - dosed in mg of elemental calcium Take 1 tablet by mouth daily with lunch.   cetirizine 10 MG tablet Commonly known as: ZYRTEC Take 10 mg by mouth daily.   diphenhydrAMINE 25 MG tablet Commonly known as: BENADRYL Take 25-50 mg by mouth at bedtime as needed for sleep.   docusate sodium 100 MG  capsule Commonly known as: COLACE Take 200 mg by mouth at bedtime.   fenofibrate 160 MG tablet TAKE ONE TABLET AT BEDTIME   FLUoxetine 20 MG capsule Commonly known as: PROZAC TAKE 1 CAPSULE BY MOUTH ONCE DAILY   folic acid 1 MG tablet Commonly known as: FOLVITE Take 1 mg by mouth in the morning and at bedtime.   furosemide 40 MG tablet Commonly known as: LASIX Take 1 tablet (40 mg total) by mouth 2 (two) times daily.   gabapentin 300 MG capsule Commonly known as: NEURONTIN Take 300 mg by mouth 2 (two) times daily.   HYDROcodone-acetaminophen 10-325 MG tablet Commonly known as: NORCO Take 1 tablet by mouth 4 (four) times daily as needed for moderate pain.   hydrocortisone cream 1 % Apply 1 application topically daily as needed for itching.   hydroxychloroquine 200 MG tablet Commonly known as: PLAQUENIL Take 200 mg by mouth 3 (three) times a week. Tue, Thurs, and Sat   lisinopril 20 MG tablet Commonly known as: ZESTRIL Take  1 tablet (20 mg total) by mouth every evening. What changed: when to take this   LUBRICATING EYE DROPS OP Place 1 drop into both eyes daily as needed (dry eyes).   metoprolol tartrate 25 MG tablet Commonly known as: LOPRESSOR Take 0.5 tablets (12.5 mg total) by mouth 2 (two) times daily.   Omeprazole 20 MG Tbec Take 1 tablet (20 mg total) by mouth daily. What changed: when to take this   PreserVision AREDS 2 Caps Take 1 capsule by mouth 2 (two) times daily.   testosterone cypionate 200 MG/ML injection Commonly known as: DEPOTESTOSTERONE CYPIONATE Inject 0.25 cc every 2 weeks What changed:   how much to take  how to take this  when to take this  additional instructions   vitamin B-12 1000 MCG tablet Commonly known as: CYANOCOBALAMIN Take 1,000 mcg by mouth daily with lunch.   vitamin E 180 MG (400 UNITS) capsule Take 800 Units by mouth daily with lunch.            Discharge Care Instructions  (From admission, onward)          Start     Ordered   11/14/19 0000  Discharge wound care:       Comments: As per previous instructions by neurosurgery.   11/14/19 1148          If you experience worsening of your admission symptoms, develop shortness of breath, life threatening emergency, suicidal or homicidal thoughts you must seek medical attention immediately by calling 911 or calling your MD immediately  if symptoms less severe.  You Must read complete instructions/literature along with all the possible adverse reactions/side effects for all the Medicines you take and that have been prescribed to you. Take any new Medicines after you have completely understood and accept all the possible adverse reactions/side effects.   Please note  You were cared for by a hospitalist during your hospital stay. If you have any questions about your discharge medications or the care you received while you were in the hospital after you are discharged, you can call the unit and asked to speak with the hospitalist on call if the hospitalist that took care of you is not available. Once you are discharged, your primary care physician will handle any further medical issues. Please note that NO REFILLS for any discharge medications will be authorized once you are discharged, as it is imperative that you return to your primary care physician (or establish a relationship with a primary care physician if you do not have one) for your aftercare needs so that they can reassess your need for medications and monitor your lab values. Today   SUBJECTIVE   Patient feels a lot better. Breathing improved remarkably. Good urine output.  VITAL SIGNS:  Blood pressure 140/69, pulse 69, temperature 98.1 F (36.7 C), temperature source Oral, resp. rate 18, height 5\' 11"  (1.803 m), weight 75.3 kg, SpO2 97 %.  I/O:    Intake/Output Summary (Last 24 hours) at 11/14/2019 1149 Last data filed at 11/14/2019 0945 Gross per 24 hour  Intake 840 ml  Output  1900 ml  Net -1060 ml    PHYSICAL EXAMINATION:  GENERAL:  79 y.o.-year-old patient lying in the bed with no acute distress.  EYES: Pupils equal, round, reactive to light and accommodation. No scleral icterus.  HEENT: Head atraumatic, normocephalic. Oropharynx and nasopharynx clear.  NECK:  Supple, no jugular venous distention. No thyroid enlargement, no tenderness.  LUNGS: Normal breath sounds bilaterally, no  wheezing, rales,rhonchi or crepitation. No use of accessory muscles of respiration.  CARDIOVASCULAR: S1, S2 normal. No murmurs, rubs, or gallops.  ABDOMEN: Soft, non-tender, non-distended. Bowel sounds present. No organomegaly or mass.  EXTREMITIES: No pedal edema, cyanosis, or clubbing.  NEUROLOGIC: Cranial nerves II through XII are intact. Muscle strength 5/5 in all extremities. Sensation intact. Gait not checked.  PSYCHIATRIC: The patient is alert and oriented x 3.  SKIN: No obvious rash, lesion, or ulcer.   DATA REVIEW:   CBC  Recent Labs  Lab 11/12/19 1108 11/12/19 1108 11/13/19 0604  WBC 6.9  --   --   HGB 8.1*   < > 8.5*  HCT 25.0*  --   --   PLT 144*  --   --    < > = values in this interval not displayed.    Chemistries  Recent Labs  Lab 11/14/19 0516  NA 136  K 3.6  CL 103  CO2 25  GLUCOSE 151*  BUN 25*  CREATININE 1.05  CALCIUM 8.5*    Microbiology Results   Recent Results (from the past 240 hour(s))  SARS Coronavirus 2 by RT PCR (hospital order, performed in [facility] hospital lab) Nasopharyngeal Nasopharyngeal Swab     Status: None   Collection Time: 11/12/19  3:57 PM   Specimen: Nasopharyngeal Swab  Result Value Ref Range Status   SARS Coronavirus 2 NEGATIVE NEGATIVE Final    Comment: (NOTE) SARS-CoV-2 target nucleic acids are NOT DETECTED.  The SARS-CoV-2 RNA is generally detectable in upper and lower respiratory specimens during the acute phase of infection. The lowest concentration of SARS-CoV-2 viral copies this assay can detect is  250 copies / mL. A negative result does not preclude SARS-CoV-2 infection and should not be used as the sole basis for treatment or other patient management decisions.  A negative result may occur with improper specimen collection / handling, submission of specimen other than nasopharyngeal swab, presence of viral mutation(s) within the areas targeted by this assay, and inadequate number of viral copies (<250 copies / mL). A negative result must be combined with clinical observations, patient history, and epidemiological information.  Fact Sheet for Patients:   StrictlyIdeas.no  Fact Sheet for Healthcare Providers: BankingDealers.co.za  This test is not yet approved or  cleared by the [place] FDA and has been authorized for detection and/or diagnosis of SARS-CoV-2 by FDA under an Emergency Use Authorization (EUA).  This EUA will remain in effect (meaning this test can be used) for the duration of the COVID-19 declaration under Section 564(b)(1) of the Act, 21 U.S.C. section 360bbb-3(b)(1), unless the authorization is terminated or revoked sooner.  Performed at [facility], [address] [place], [place] [address]     RADIOLOGY:  CT Angio Chest PE W and/or Wo Contrast  Result Date: 11/12/2019 CLINICAL DATA:  79 year old male with hemoptysis. EXAM: CT ANGIOGRAPHY CHEST WITH CONTRAST TECHNIQUE: Multidetector CT imaging of the chest was performed using the standard protocol during bolus administration of intravenous contrast. Multiplanar CT image reconstructions and MIPs were obtained to evaluate the vascular anatomy. CONTRAST:  77mL OMNIPAQUE IOHEXOL 350 MG/ML SOLN COMPARISON:  Chest radiograph dated 11/12/2019 and CT dated 08/09/2016. FINDINGS: Evaluation of this exam is limited due to respiratory motion artifact. Cardiovascular: Mild cardiomegaly. No pericardial effusion. There is mild atherosclerotic calcification of the  thoracic aorta. The aorta is tortuous. No aneurysmal dilatation or dissection. There is dilatation of the main pulmonary trunk suggestive of pulmonary hypertension. Clinical correlation  is recommended. Evaluation of the pulmonary arteries is somewhat limited due to respiratory motion artifact. No pulmonary artery embolus identified. Mediastinum/Nodes: There is no hilar or mediastinal adenopathy. The esophagus is grossly unremarkable. No mediastinal fluid collection. Lungs/Pleura: Small bilateral pleural effusions. There is diffuse interstitial and interlobular septal prominence with patchy areas of airspace opacity primarily involving the upper lobes. Findings most consistent with edema, although pneumonia is not excluded clinical correlation is recommended. There is no pneumothorax. The central airways are patent. Upper Abdomen: Indeterminate 2.5 cm partially visualized right renal upper pole hypodense lesion, likely a cyst. Cholecystectomy. Musculoskeletal: Osteopenia with degenerative changes of the spine. Multilevel thoracic old compression fractures and anterior wedging and vertebroplasty. Median sternotomy wires. Partially visualized lower thoracic posterior fusion. No acute osseous pathology. Review of the MIP images confirms the above findings. IMPRESSION: 1. No CT evidence of pulmonary artery embolus. 2. Cardiomegaly with findings of CHF and small bilateral pleural effusions. Pneumonia is not excluded. Clinical correlation is recommended. 3. Dilatation of the main pulmonary trunk suggestive of pulmonary hypertension. Clinical correlation is recommended. 4. Aortic Atherosclerosis (ICD10-I70.0). Electronically Signed   By: Anner Crete M.D.   On: 11/12/2019 15:34     CODE STATUS:     Code Status Orders  (From admission, onward)         Start     Ordered   11/12/19 2223  Full code  Continuous        11/12/19 2223        Code Status History    Date Active Date Inactive Code Status Order  ID Comments User Context   11/02/2019 1823 11/03/2019 1708 Full Code [id number]  Kary Kos, MD Inpatient   12/05/2017 1724 12/07/2017 2017 Full Code [id number]  Eleonore Chiquito, NP ED   10/28/2017 2238 10/31/2017 1635 Full Code [id number]  Vaughan Basta, MD Inpatient   10/19/2017 1935 10/24/2017 1753 Full Code [id number]  Kary Kos, MD Inpatient   09/28/2017 1701 10/01/2017 1726 Full Code [id number]  Kary Kos, MD Inpatient   06/08/2017 1712 06/10/2017 1904 Full Code [id number]  Kary Kos, MD Inpatient   10/14/2016 1541 10/18/2016 1547 Full Code [id number]  Kary Kos, MD Inpatient   03/08/2016 1334 03/08/2016 2249 Full Code [id number]  Kary Kos, MD Inpatient   10/11/2015 2213 10/13/2015 1730 DNR [id number]  Nicholes Mango, MD ED   Advance Care Planning Activity       TOTAL TIME TAKING CARE OF THIS PATIENT: *40* minutes.    Fritzi Mandes M.D  [facility]    CC: Primary care physician; [name], MD

## 2019-11-14 NOTE — Discharge Instructions (Signed)
Hemoptysis  Hemoptysis is when you cough up blood. It can be mild or serious. If it is mild, you may cough up bloody spit and mucus (sputum). If you cough up 1-2 cups (240-480 mL) of blood within 24 hours (massive hemoptysis), it is an emergency. If you cough up blood, it is important to go and see your doctor. Follow these instructions at home:  Watch your condition for any changes.  Take over-the-counter and prescription medicines only as told by your doctor.  If you were prescribed an antibiotic medicine, take it as told by your doctor. Do not stop taking the antibiotic even if you start to feel better.  Go back to your normal activities as told by your doctor. Ask your doctor what activities are safe for you to do.  Do not use any products that contain nicotine or tobacco. These include cigarettes and e-cigarettes. If you need help quitting, ask your doctor.  Keep all follow-up visits as told by your doctor. This is important. Contact a doctor if:  You have a fever.  You cough up bloody spit and mucus. Get help right away if:  You cough up fresh blood or blood clots.  You have trouble breathing.  You have chest pain. This information is not intended to replace advice given to you by your health care provider. Make sure you discuss any questions you have with your health care provider. Document Revised: 04/22/2017 Document Reviewed: 02/06/2016 Elsevier Patient Education  2020 Elsevier Inc.  

## 2019-11-15 ENCOUNTER — Telehealth: Payer: Self-pay | Admitting: Family

## 2019-11-15 ENCOUNTER — Telehealth: Payer: Self-pay

## 2019-11-15 NOTE — Telephone Encounter (Signed)
Unable to reach patient regarding new patient CHF Clinic appointment that was made after his recent hospital discharge for 7/9.    Alyse Low, [place]

## 2019-11-15 NOTE — Telephone Encounter (Signed)
Failed to reach patient for transition of care hospital follow up. No answer. Unable to leave a message. Currently scheduled with PMD 11/21/19 @ 11:30 for hfu. Will follow as appropriate.

## 2019-11-16 ENCOUNTER — Telehealth: Payer: Self-pay | Admitting: Cardiovascular Disease

## 2019-11-16 NOTE — Telephone Encounter (Signed)
Called patient and was unable to leave a message

## 2019-11-16 NOTE — Telephone Encounter (Signed)
TCM....  Patient is  discharged      They are scheduled to see Gilford Rile 7/2 at 10 am      They need to be seen within 7-10     Please call

## 2019-11-16 NOTE — Telephone Encounter (Signed)
Second attempt to reach patient for transitions of care. No answer. Unable to leave voicemail. Okay to keep as TCM for upcoming appointment 11/21/19 @ 11:30.

## 2019-11-19 NOTE — Telephone Encounter (Signed)
Patient contacted regarding discharge from [facility] on 11/14/19.  Patient understands to follow up with provider [name], NP on Friday 11/23/19 at 10:00 am at the [facility] office, however, he was adamant that he was told this was to be with Dr. [name] and he only wants to see him- I advised the patient Dr. [name] has no openings this week at the present time and I will need to forward to Dr. Donivan Scull nurse to review. He has been advised if we are able to change his appointment, it will not be on 7/2 as Dr. [name] is not in the office that day.  Patient understands discharge instructions? Yes Patient understands medications and regiment? Yes Patient understands to bring all medications to this visit? Patient did not give me the opportunity to ask this question as he was focused on changing his appointment only to Dr. [name].   Ask patient:  Are you enrolled in My Chart- unable to address with the patient at this time.

## 2019-11-20 NOTE — Telephone Encounter (Signed)
Spoke with patient and confirmed appointment scheduled for this Friday with one of our APP providers here in the office. He confirmed date, time, and verbalized understanding to arrive early to allow time to get to our office. He was appreciative for the call back with no further questions at this time.

## 2019-11-21 ENCOUNTER — Encounter: Payer: Self-pay | Admitting: Family Medicine

## 2019-11-21 ENCOUNTER — Ambulatory Visit (INDEPENDENT_AMBULATORY_CARE_PROVIDER_SITE_OTHER): Payer: Medicare Other | Admitting: Family Medicine

## 2019-11-21 ENCOUNTER — Other Ambulatory Visit: Payer: Self-pay

## 2019-11-21 VITALS — BP 118/60 | HR 64 | Temp 97.7°F | Ht 71.5 in | Wt 163.4 lb

## 2019-11-21 DIAGNOSIS — G629 Polyneuropathy, unspecified: Secondary | ICD-10-CM

## 2019-11-21 DIAGNOSIS — D649 Anemia, unspecified: Secondary | ICD-10-CM

## 2019-11-21 DIAGNOSIS — R4189 Other symptoms and signs involving cognitive functions and awareness: Secondary | ICD-10-CM | POA: Diagnosis not present

## 2019-11-21 DIAGNOSIS — R531 Weakness: Secondary | ICD-10-CM | POA: Diagnosis not present

## 2019-11-21 DIAGNOSIS — R042 Hemoptysis: Secondary | ICD-10-CM

## 2019-11-21 DIAGNOSIS — I5031 Acute diastolic (congestive) heart failure: Secondary | ICD-10-CM | POA: Diagnosis not present

## 2019-11-21 MED ORDER — GABAPENTIN 300 MG PO CAPS: 300.0000 mg | ORAL_CAPSULE | Freq: Three times a day (TID) | ORAL | 1 refills | Status: DC

## 2019-11-21 NOTE — Assessment & Plan Note (Signed)
Chronic issue.  Has been worsening though gabapentin has been somewhat helpful.  We will have him increase the gabapentin to 300 mg 3 times daily.  He mentions seeing a chiropractor for this and I advised against that given his history of back issues and surgeries.

## 2019-11-21 NOTE — Assessment & Plan Note (Signed)
Continues to have some shortness of breath with exertion.  Feels tired and weak.  His lungs are clear.  I suspect the continued symptoms may be related to his low hemoglobin and he will see hematology tomorrow.  He will continue with his Lasix.  He will see cardiology as planned on Friday.

## 2019-11-21 NOTE — Patient Instructions (Signed)
Nice to see you. Please follow-up with hematology and cardiology as planned. Your fatigue and tiredness may be related to your low hemoglobin.  Hematology can check that tomorrow. We will increase your gabapentin to 300 mg 3 times a day. I have placed home health referral and somebody will contact you regarding that.

## 2019-11-21 NOTE — Progress Notes (Signed)
[facility]  [address] [place], [place] [address] Phone: [phone]  Fax: [phone]   Clinic Day: 11/22/2019  Referring physician: [name], MD  Chief Complaint: Bryan [name] is a 79 y.o. male with arthritis, iron deficiency anemia, and mild pancytopenia who is seen for 5 month assessment.  HPI:  The patient was last seen in the hematology clinic on 06/21/2019. At that time, he was doing fair.  He was on Eliquis for atrial fibrillation. Hematocrit was 39.7, hemoglobin 12.9, MCV 97.3, platelets 144,000, WBC 6,200. Ferritin was 138 with an iron saturation of 18% and a TIBC of 466. Sed rate was 4.  Thoracic spine hardware was removed by Dr. Kary Kos on 11/02/2019.   The patient was admitted to [facility] from 11/12/2019-11/14/2019 for hemoptysis, pulmonary edema, and acute hypoxemic respiratory failure. CXR revealed symmetric bilateral airspace disease be secondary to pulmonary edema or pneumonia. There was also cardiomegaly and aortic atherosclerosis. Chest CT revealed no evidence of pulmonary artery embolus. There was cardiomegaly with findings of CHF and small bilateral pleural effusions. Pneumonia was not excluded. Dilatation of the main pulmonary trunk suggestive of pulmonary hypertension. Aortic Atherosclerosis. Echo revealed an EF of 55-60%. The patient was placed on 20 mg IV Lasix BID and was switched to 40 mg po BID. CBC revealed a hematocrit of 25.0, hemoglobin 8.1, MCV 96.9, platelets 144,000, and WBC 6,900. Follow up with Dr. [name] for anemia was recommended.  CBC has been followed: 06/20/2019:  Hematocrit 39.7, hemoglobin 12.9, MCV   97.3, platelets 144,000, WBC 6,200 (ANC 4800).   11/02/2019:  Hematocrit 35.1, hemoglobin 11.1, MCV 100.3, platelets 113,000, WBC 3,500. 11/12/2019:  Hematocrit 25.0, hemoglobin   8.1, MCV   96.9, platelets 144,000, WBC 6,900  During the interim, he reports that he went to the ER because he was  coughing up a small amount of blood. He has been eating well. He notes that he still gets tired.  He denies blood in stools, black stools, hematuria, leg swelling.  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 His back pain has improved since his surgery. He reports that he is unsteady on his feet. His rheumatoid arthritis is stable.  He is still getting testosterone 1/2 cc every two weeks.  The patient received the [place] COVID-19 vaccines in 67/2094 and had no complications.   Past Medical History:  Diagnosis Date  . Abnormal CT scan    Asymmetric left rectal wall thickening   . Anemia   . Anxiety   . Arrhythmia   . Chronic lower back pain   . Complication of anesthesia    Memory loss 09/2015  . Coronary artery disease   . Depression   . GERD (gastroesophageal reflux disease)   . Glaucoma   . H/O thoracic aortic aneurysm repair   . Headache   . Heart murmur   . History of being hospitalized    memory lose kidney funtion down blood pressure up  . History of blood transfusion    "think he had one when he had heart valve OR" (10/14/2016); "none since" (10/19/2017)  . History of chicken pox   . Hyperlipidemia   . Hypertension   . Lumbar stenosis   . Murmur   . Neuropathy   . Osteoarthritis    worse in feet and ankles  . Paroxysmal A-fib ([place])   . Poor short term memory    takes Aricept  .  Rheumatoid arthritis ([place])    "all over" (10/14/2016)  . Schizophrenia ([place])   . Seasonal allergies   . Thoracic spine pain   . Valvular heart disease   . Wears glasses    reading    Past Surgical History:  Procedure Laterality Date  . AORTIC VALVE REPLACEMENT  2007   [facility].  [place], [place]; "pig valve"  . APPENDECTOMY  2010  . BACK SURGERY    . CARDIAC VALVE REPLACEMENT    . CATARACT EXTRACTION W/ INTRAOCULAR LENS  IMPLANT, BILATERAL Bilateral 2012  . COLONOSCOPY WITH PROPOFOL N/A 09/24/2016   Procedure: COLONOSCOPY WITH PROPOFOL;   Surgeon: Jonathon Bellows, MD;  Location: [facility] ENDOSCOPY;  Service: Endoscopy;  Laterality: N/A;  . ESOPHAGOGASTRODUODENOSCOPY (EGD) WITH PROPOFOL N/A 09/24/2016   Procedure: ESOPHAGOGASTRODUODENOSCOPY (EGD) WITH PROPOFOL;  Surgeon: Jonathon Bellows, MD;  Location: [facility] ENDOSCOPY;  Service: Endoscopy;  Laterality: N/A;  . GIVENS CAPSULE STUDY N/A 09/24/2016   Procedure: GIVENS CAPSULE STUDY;  Surgeon: Jonathon Bellows, MD;  Location: [facility] ENDOSCOPY;  Service: Endoscopy;  Laterality: N/A;  . HAMMER TOE SURGERY Right 10/09/2015   Procedure: HAMMER TOE REPAIR WITH K-WIRE FIXATION RIGHT SECOND TOE;  Surgeon: Albertine Patricia, DPM;  Location: [place];  Service: Podiatry;  Laterality: Right;  WITH LOCAL  . HARDWARE REMOVAL N/A 11/02/2019   Procedure: Removal of hardware thoracic spine;  Surgeon: Kary Kos, MD;  Location: [place];  Service: Neurosurgery;  Laterality: N/A;  posterior  . INGUINAL HERNIA REPAIR Right 05/05/2018   Medium Bard PerFix plug.  Surgeon: Robert Bellow, MD;  Location: [facility] ORS;  Service: General;  Laterality: Right;  . IR VERTEBROPLASTY CERV/THOR BX INC UNI/BIL INC/INJECT/IMAGING  10/27/2018  . JOINT REPLACEMENT Left    left total knee  . LAMINECTOMY WITH POSTERIOR LATERAL ARTHRODESIS LEVEL 3 N/A 09/28/2017   Procedure: LUMBAR  POSTEROR  FUSION REVISION - LUMBAR ONE -TWO, LUMBAR TWO -THREE,THREE-FOUR ,BILATERAL ARTHRODESIS REMOVAL LUMBAR THREE HARDWARE;  Surgeon: Kary Kos, MD;  Location: [place];  Service: Neurosurgery;  Laterality: N/A;  . LAMINECTOMY WITH POSTERIOR LATERAL ARTHRODESIS LEVEL 3 N/A 10/20/2017   Procedure: Revision fusion and removal of hardware Lumbar one, Pedicle screw fixation thoracic ten-lumbar two, thoracic nine-ten laminotomy, Posterior Lumbar Arthrodesis thoracic ten-lumbar two ;  Surgeon: Kary Kos, MD;  Location: [place];  Service: Neurosurgery;  Laterality: N/A;  . LAPAROSCOPIC CHOLECYSTECTOMY  2010  . LUMBAR FUSION Right 06/08/2017   LUMBAR THREE-FOUR, LUMBAR  FOUR-FIVE POSTEROLATERAL ARTHRODESIS WITH RIGHT LUMBAR FOUR-FIVE LAMINECTOMY/FORAMINOTOMY  . LUMBAR LAMINECTOMY/DECOMPRESSION MICRODISCECTOMY Right 03/08/2016   Procedure: Laminectomy and Foraminotomy - Lumbar Five-Sacral One Right;  Surgeon: Kary Kos, MD;  Location: [place];  Service: Neurosurgery;  Laterality: Right;  Right  . MULTIPLE TOOTH EXTRACTIONS     "3"  . POSTERIOR LUMBAR FUSION  10/14/2016   L5-S1  . REPLACEMENT TOTAL KNEE Left 2003  . THORACIC AORTIC ANEURYSM REPAIR  2010    Family History  Problem Relation Age of Onset  . Heart failure Mother   . Hypertension Mother   . Asthma Mother   . Osteoporosis Mother   . Heart attack Father 14       MI  . Hypertension Sister   . Prostate cancer Neg Hx   . Bladder Cancer Neg Hx   . Kidney cancer Neg Hx   . Colon cancer Neg Hx     Social History:  reports that he quit smoking about 38 years ago. His smoking use included cigarettes. He has  a 32.00 pack-year smoking history. He has never used smokeless tobacco. He reports current alcohol use. He reports that he does not use drugs. He smoked 1 pack a day x 12 years. He stopped smoking 30 years ago. He drinks a glass of wine occasionally. He is a Software engineer. [place] His wife passed away in Jul 21, 2018. The patient is alone today.  Allergies:  Allergies  Allergen Reactions  . Penicillins Hives, Swelling and Other (See Comments)    SWELLING REACTION UNSPECIFIED PATIENT HAS TAKEN AMOXICILLIN ON MED HX FROM [facility] PATIENT HAS HAD A PCN REACTION WITH IMMEDIATE RASH, FACIAL/TONGUE/THROAT SWELLING, SOB, OR LIGHTHEADEDNESS WITH HYPOTENSION:  #  #  #  YES  #  #  #   Has patient had a PCN reaction causing severe rash involving mucus membranes or skin necrosis: No Has patient had a PCN reaction that required hospitalization No Has patient had a PCN reaction occurring within the last 10 years: No  . Demerol [Meperidine] Hives and Nausea And  Vomiting    Current Medications: Current Outpatient Medications  Medication Sig Dispense Refill  . apixaban (ELIQUIS) 5 MG TABS tablet Take 1 tablet (5 mg total) by mouth 2 (two) times daily. 180 tablet 2  . aspirin EC 81 MG tablet Take 1 tablet (81 mg total) by mouth daily. (Patient taking differently: Take 81 mg by mouth daily with supper. )    . atorvastatin (LIPITOR) 40 MG tablet TAKE ONE TABLET BY MOUTH EVERY DAY (Patient taking differently: Take 40 mg by mouth at bedtime. ) 90 tablet 3  . calcium carbonate (OS-CAL - DOSED IN MG OF ELEMENTAL CALCIUM) 1250 (500 Ca) MG tablet Take 1 tablet by mouth daily with lunch.    . Carboxymethylcellul-Glycerin (LUBRICATING EYE DROPS OP) Place 1 drop into both eyes daily as needed (dry eyes).    . cetirizine (ZYRTEC) 10 MG tablet Take 10 mg by mouth daily.    . diphenhydrAMINE (BENADRYL) 25 MG tablet Take 25-50 mg by mouth at bedtime as needed for sleep.    Marland Kitchen docusate sodium (COLACE) 100 MG capsule Take 200 mg by mouth at bedtime.    . fenofibrate 160 MG tablet TAKE ONE TABLET AT BEDTIME (Patient taking differently: Take 160 mg by mouth at bedtime. ) 90 tablet 1  . FLUoxetine (PROZAC) 20 MG capsule TAKE 1 CAPSULE BY MOUTH ONCE DAILY (Patient taking differently: Take 20 mg by mouth daily. ) 90 capsule 1  . folic acid (FOLVITE) 1 MG tablet Take 1 mg by mouth in the morning and at bedtime.     . gabapentin (NEURONTIN) 300 MG capsule Take 1 capsule (300 mg total) by mouth 3 (three) times daily. 270 capsule 1  . HYDROcodone-acetaminophen (NORCO) 10-325 MG tablet Take 1 tablet by mouth 4 (four) times daily as needed for moderate pain.     . hydrocortisone cream 1 % Apply 1 application topically daily as needed for itching.    . hydroxychloroquine (PLAQUENIL) 200 MG tablet Take 200 mg by mouth 3 (three) times a week. Tue, Thurs, and Sat    . lisinopril (ZESTRIL) 20 MG tablet Take 1 tablet (20 mg total) by mouth every evening. (Patient taking differently: Take  20 mg by mouth daily with supper. ) 90 tablet 1  . metoprolol tartrate (LOPRESSOR) 25 MG tablet Take 0.5 tablets (12.5 mg total) by mouth 2 (two) times daily. 60 tablet 1  . Multiple Vitamins-Minerals (PRESERVISION AREDS 2)  CAPS Take 1 capsule by mouth 2 (two) times daily.    . Omeprazole 20 MG TBEC Take 1 tablet (20 mg total) by mouth daily. (Patient taking differently: Take 1 tablet by mouth daily with lunch. ) 30 each 11  . testosterone cypionate (DEPOTESTOSTERONE CYPIONATE) 200 MG/ML injection Inject 0.25 cc every 2 weeks (Patient taking differently: Inject 100 mg into the muscle every 14 (fourteen) days. ) 10 mL 0  . vitamin B-12 (CYANOCOBALAMIN) 1000 MCG tablet Take 1,000 mcg by mouth daily with lunch.     . vitamin E 180 MG (400 UNITS) capsule Take 800 Units by mouth daily with lunch.    . furosemide (LASIX) 40 MG tablet Take 1 tablet (40 mg total) once daily in the morning, take additional tablet in the afternoon if needed for weight gain. 30 tablet 1   No current facility-administered medications for this visit.    Review of Systems  Constitutional: Positive for malaise/fatigue and weight loss (1 lb). Negative for chills, diaphoresis and fever.       Doing well.  HENT: Negative.  Negative for congestion, ear pain, hearing loss, nosebleeds, sinus pain and sore throat.   Eyes: Negative.  Negative for blurred vision, double vision and photophobia.  Respiratory: Negative.  Negative for cough, hemoptysis, sputum production, shortness of breath and wheezing.   Cardiovascular: Negative.  Negative for chest pain, palpitations, orthopnea, leg swelling and PND.  Gastrointestinal: Negative.  Negative for abdominal pain, blood in stool, constipation, diarrhea, heartburn, melena, nausea and vomiting.       Eating well.  Genitourinary: Negative.  Negative for dysuria, frequency, hematuria and urgency.  Musculoskeletal: Positive for back pain (improving since surgery) and joint pain (rheumatoid  arthritis). Negative for myalgias.  Skin: Negative.  Negative for rash.  Neurological: Positive for sensory change (neuropathy, on Neurontin). Negative for dizziness, tingling, weakness and headaches.       Unsteady on his feet.  Endo/Heme/Allergies: Negative.  Does not bruise/bleed easily.       Testosterone q 2 weeks.  Psychiatric/Behavioral: Positive for depression (lost his wife in 06/2018). Negative for memory loss and substance abuse. The patient is not nervous/anxious and does not have insomnia.   All other systems reviewed and are negative.  Performance status (ECOG):  1  Vitals Blood pressure 119/72, pulse (!) 56, temperature 97.8 F (36.6 C), temperature source Tympanic, resp. rate 18, height 5' 11.5" (1.816 m), weight 164 lb 12.7 oz (74.7 kg), SpO2 98 %.   Physical Exam Vitals and nursing note reviewed.  Constitutional:      General: He is not in acute distress.    Appearance: He is well-developed. He is not diaphoretic.     Comments: He has a cane by his side.  HENT:     Head: Normocephalic and atraumatic.     Mouth/Throat:     Pharynx: No oropharyngeal exudate.  Eyes:     General: No scleral icterus.    Extraocular Movements: Extraocular movements intact.     Conjunctiva/sclera: Conjunctivae normal.     Pupils: Pupils are equal, round, and reactive to light.     Comments: Hazel eyes.  Neck:     Vascular: No JVD.  Cardiovascular:     Rate and Rhythm: Normal rate and regular rhythm.     Heart sounds: Normal heart sounds. No murmur heard.   Pulmonary:     Effort: Pulmonary effort is normal. No respiratory distress.     Breath sounds: Normal breath sounds. No wheezing  or rales.  Abdominal:     General: Bowel sounds are normal. There is no distension.     Palpations: Abdomen is soft. There is no mass.     Tenderness: There is no abdominal tenderness. There is no guarding or rebound.  Musculoskeletal:        General: No swelling or tenderness. Normal range of  motion.     Cervical back: Normal range of motion and neck supple.     Comments: Ulnar deviation secondary to rheumatoid arthritis.  Lymphadenopathy:     Head:     Right side of head: No preauricular, posterior auricular or occipital adenopathy.     Left side of head: No preauricular, posterior auricular or occipital adenopathy.     Cervical: No cervical adenopathy.     Upper Body:     Right upper body: No supraclavicular or axillary adenopathy.     Left upper body: No supraclavicular or axillary adenopathy.     Lower Body: No right inguinal adenopathy. No left inguinal adenopathy.  Skin:    General: Skin is warm and dry.     Coloration: Skin is not pale.     Findings: No erythema or rash.  Neurological:     Mental Status: He is alert and oriented to person, place, and time. Mental status is at baseline.  Psychiatric:        Mood and Affect: Mood normal.        Behavior: Behavior normal.        Thought Content: Thought content normal.        Judgment: Judgment normal.     Office Visit on 11/22/2019  Component Date Value Ref Range Status  . Sed Rate 11/22/2019 40* 0 - 20 mm/hr Final   Performed at [facility], [address] [place], [place] [address]  . Iron 11/22/2019 49  45 - 182 ug/dL Final  . TIBC 11/22/2019 433  250 - 450 ug/dL Final  . Saturation Ratios 11/22/2019 11* 17.9 - 39.5 % Final  . UIBC 11/22/2019 384  ug/dL Final   Performed at [facility], [address], [place], [place] [address]  . Ferritin 11/22/2019 180  24 - 336 ng/mL Final   Performed at [facility], [address] [place], [place] [address]  . LDH 11/22/2019 306* 98 - 192 U/L Final   Performed at [facility], [address] [place], [place] [address]  . Retic Ct Pct 11/22/2019 3.0  0.4 - 3.1 % Final  . RBC. 11/22/2019 3.12* 4.22 - 5.81 MIL/uL Final  . Retic Count, Absolute 11/22/2019 94.2  19.0 - 186.0 K/uL Final  . Immature Retic Fract 11/22/2019  17.2* 2.3 - 15.9 % Final   Performed at [facility], [address], [place], [place] [address]  . WBC 11/22/2019 4.0  4.0 - 10.5 K/uL Final  . RBC 11/22/2019 3.09* 4.22 - 5.81 MIL/uL Final  . Hemoglobin 11/22/2019 9.6* 13.0 - 17.0 g/dL Final  . HCT 11/22/2019 28.9* 39 - 52 % Final  . MCV 11/22/2019 93.5  80.0 - 100.0 fL Final  . MCH 11/22/2019 31.1  26.0 - 34.0 pg Final  . MCHC 11/22/2019 33.2  30.0 - 36.0 g/dL Final  . RDW 11/22/2019 14.0  11.5 - 15.5 % Final  . Platelets 11/22/2019 271  150 - 400 K/uL Final  . nRBC 11/22/2019 0.0  0.0 - 0.2 % Final  . Neutrophils Relative % 11/22/2019 67  %  Final  . Neutro Abs 11/22/2019 2.7  1.7 - 7.7 K/uL Final  . Lymphocytes Relative 11/22/2019 18  % Final  . Lymphs Abs 11/22/2019 0.7  0.7 - 4.0 K/uL Final  . Monocytes Relative 11/22/2019 11  % Final  . Monocytes Absolute 11/22/2019 0.4  0 - 1 K/uL Final  . Eosinophils Relative 11/22/2019 2  % Final  . Eosinophils Absolute 11/22/2019 0.1  0 - 0 K/uL Final  . Basophils Relative 11/22/2019 1  % Final  . Basophils Absolute 11/22/2019 0.0  0 - 0 K/uL Final  . Immature Granulocytes 11/22/2019 1  % Final  . Abs Immature Granulocytes 11/22/2019 0.03  0.00 - 0.07 K/uL Final   Performed at [facility], [address], [place], [place] [address]    Assessment:  Bryan [name] is a 79 y.o. male withrheumatoid arthritisand progressive anemiaover the past 2 years. He has been on Plaquenil and hydralazinewhich can cause anemia. Plaquenil was discontinued on 08/26/2016. He denies any exposure to radiation or toxins. He denies any prior history of hepatitis, prior transfusions or HIV risk factors. He denies any herbal products.  Diet is good. EGDon 09/24/2016 revealed patchy candidiasis in the entire esophagus. There were two non-bleeding angioectasias in the duodenum treated with argon plasma coagulation. Gastritis was biopsied. Pathology revealed mild chronic  gastritis negative for H pylori, dysplasia and malignancy. Colonoscopyon 09/24/2016 revealed diverticulosis in the entire colon, one 3 mm polyp in the cecum, and non-bleeding internal hemorrhoids. Pathology from the cecal polyp revealed a tubular adenoma negative for high grade dysplasia and malignancy.   He denies any melena or hematochezia. He denies any hematuria.  CBC on 01/10/2018revealed a hematocrit of 29.5, hemoglobin 10.0, MCV 92.3, platelets 155,000, and WBC 4200. Stool was guaiac negative x 2 in 06/2016. Labs on 01/12/2018revealed a ferritin of 124, iron saturation 22% and TIBC of 370. B12 was 883 on 06/18/2014.  Work-up on 02/26/2018revealed a hematocrit of 31.7, hemoglobin 10.6, MCV 91, platelets 143,000, white count 3000 with an ANC of 1800. Absolute lymphocyte count was 700 (low). Creatinine was 1.57. LDHwas 269 (98-192). Normal labsincluded: uric acid, folate, [place], SPEP, and copper. Iron studiesincluded a saturation of 11% (low) and a TIBC of 454 (high) c/wiron deficiency anemia. Sed rate was 19. Retic was 0.9% (low). He is on oral ironwith vitamin C.  Chest, abdomen, and pelvic CTon 08/09/2016 revealed no acute findings or clear evidence of malignancy in the chest, abdomen or pelvis. There was asymmetric left rectal wall thickeningpossibly secondary to volume averaging with the prostate gland.  Bone marrow aspirate and biopsyon 11/04/2016 revealed a normocellular marrow (20%) with trilineage hematopoiesis and maturation. There was mild megakaryocytic atypia. Storage iron was present. There were rare ringed sideroblasts. Cytogeneticsand FISHwere normal (34, XY).  He was admitted to [facility] 10/14/2016 - 10/18/2016 for spinal stenosis of the lumbar region. He underwent redo decompressive lumbar laminotomy L5-S1 with radical foraminotomy the L5 and S1 nerve root with complete medial facetectomies. He underwentback surgery(revision fusion  removal hardware and pedicle screw fixation) on 10/20/2017.  He was admitted to [facility] 10/28/2017 - 10/31/2017 with symptomatic anemia. He received 2 units of PRBCs. Hemoglobin improved from 6.2 to 8.1.  Hospital work-up revealed the following normal labs:ferritin (65), B12 (401), TSH, SPEP, haptoglobin. Testosteronewas <3 (low). Iron saturation was 7% with a TIBC of 317. LDH was 205 (98-192). Retic was 2.6%. Creatinine was 1.37 (CrCl 47.3 ml/min).  Peripheral smear revealedleukopenia with normal WBC morphology. Normocytic anemia with  polychromasia, anisocytosis and rouleaux formation.   Work-up on 11/08/2017 revealed a hematocrit of 27.2, hemoglobin 9.0, platelets 200,000, WBC 5800 with an [place] of 4100. Ferritin was 86. Iron saturation was 6% with a TIBC of 382. Sed rate was 56 (0-20). Cold agglutinins were negative. Reticulocyte count was 1.2%. SPEP revealed no monoclonal protein on 10/31/2017.  He received weekly Venoferx 2 (11/25/2017 - 12/02/2017) and x 2 (12/23/2017 - 12/30/2017).  Ferritinhas been followed: 106 on 06/24/2017, 65 on 10/30/2017, 86 on 11/08/2017, 124 on 12/12/2017, 106 on 12/15/2017, 95 on 01/20/2018, 133 on 03/10/2018, 90 on 12/19/2018, and 138 on 06/20/2019. Iron saturationwas 6% on 06/24/2017 and 6% on 01/20/2018. Sed rate was 56 on 11/08/2017, 64 on 12/05/2017, and 40 on 11/22/2019.  He has a neuropathic ulcerof the right great toe. He has treated with Septra then switched to doxycycline. He underwent back surgeryon 10/20/2017.  The patient was admitted to [facility] from 11/12/2019-11/14/2019 for hemoptysis, pulmonary edema, and acute hypoxemic respiratory failure. CXR revealed symmetric bilateral airspace disease be secondary to pulmonary edema or pneumonia.   The patient received the [place] COVID-19 vaccines in 05/2019.  Symptomatically, he feels "ok".  He denies any further hemoptysis.  He remains on Eliquis.  Plan: 1.   Labs today:   CBC with diff, retic, ferritin, iron studies, sed rate, LDH. 2.  Iron deficiency anemia Hematocrit 39.7.  Hemoglobin 12.9. MCV   97.3 on 06/20/2019.  Hematocrit 35.1.  Hemoglobin 11.1.  MCV 100.3 on 11/02/2019.  Hematocrit 28.9.  Hemoglobin   9.6.  MCV   93.5 on 11/22/2019.             Ferritin 180. Iron saturation 11% (low) with a TIBC of 433.  Sed rate 40 (high). Retic 3.0% (relatively low).  LDH 306.   B12 was 401 on 06/10/20219 and folate > 70.2 on 07/19/2016.     TSH was 0.892 on 12/19/2018.  Patient with limited volume hemoptysis with recent admission.  Patient denies any bleeding. 3. Thrombocytopenia, resolved Platelet count 144,000 on 06/20/2019.  Platelet count 271,000 on 11/22/2019. He is on no new medications or herbal products.               He remains on Plaquenil.             Continue to monitor. 4.   RN to call patient with lab results and possible additional work-up or trial of Venofer.   Addendum:  Patient to return for additional labs (Coombs, haptoglobin, B12, folate, and TSH).  If no evidence of hemolysis, Venofer trial to assess response.   I discussed the assessment and treatment plan with the patient.  The patient was provided an opportunity to ask questions and all were answered.  The patient agreed with the plan and demonstrated an understanding of the instructions.  The patient was advised to call back if the symptoms worsen or if the condition fails to improve as anticipated.  I provided 13 minutes of face-to-face time during this this encounter and > 50% was spent counseling as documented under my assessment and plan. An additional 8 minutes were spent reviewing his chart (Epic and Care Everywhere) including notes, labs, and imaging studies.   Lequita Asal, MD, PhD    11/22/2019, 6:42 PM   I, De Burrs, am acting as a scribe for Lequita Asal, MD.  I, [place] [name], MD, have reviewed the  above documentation for accuracy and completeness, and I agree with the above.

## 2019-11-21 NOTE — Assessment & Plan Note (Signed)
I suspect this is related to deconditioning as well as his anemia and CHF.  Will refer for home health PT and nursing to evaluate.

## 2019-11-21 NOTE — Assessment & Plan Note (Signed)
Patient with some mild memory difficulty.  Discussed that he could try the Provigil to see if it would be beneficial.  Advised that there is not any good data that it is beneficial though it should not harm him.

## 2019-11-21 NOTE — Progress Notes (Signed)
Bryan Rumps, MD Phone: [phone]  Bryan [name] is a 79 y.o. male who presents today for hospital follow-up.  Patient was hospitalized from 11/12/2019-11/14/2019.  He was hospitalized for acute hypoxic respiratory failure secondary to pulmonary edema and acute diastolic congestive heart failure.  He was admitted and placed on IV Lasix and switch to p.o. Lasix twice daily 40 mg by cardiology.  He diuresed well.  He had some hemoptysis related to this as well.  As soon as he got on the Lasix he reported that resolved.  Hemoglobin was 8.5 which is down from 11.1 several weeks prior.  He notes he is fairly stable since discharge.  He has shortness of breath with exertion and feels tired and weak when he does anything.  No cough since discharge.  No hemoptysis.  No swelling.  No orthopnea.  Rare PND.  He has resumed Eliquis.  He sees cardiology on Friday and sees hematology tomorrow.  He notes the biggest thing bothering him currently is his neuropathy.  He is on gabapentin 300 mg twice daily notes that has been beneficial to a certain degree.  It does not make him drowsy.  He also notes memory issues with forgetting names and forgetting little things.  No ADL issues.  He has stopped driving relating to his neuropathy.  He has been taking Prevacid and recently to see if that would be beneficial.  Given his tiredness and weakness and shortness of breath he has trouble doing things around his home.  He wonders about getting some help at home.  Discharge summary reviewed.  Medications reviewed.  Social History   Tobacco Use  Smoking Status Former Smoker  . Packs/day: 1.00  . Years: 32.00  . Pack years: 32.00  . Types: Cigarettes  . Quit date: 07/05/1981  . Years since quitting: 38.4  Smokeless Tobacco Never Used     ROS see history of present illness  Objective  Physical Exam Vitals:   11/21/19 1135  BP: 118/60  Pulse: 64  Temp: 97.7 F (36.5 C)  SpO2: 99%    BP Readings from  Last 3 Encounters:  11/21/19 118/60  11/14/19 138/71  11/03/19 129/75   Wt Readings from Last 3 Encounters:  11/21/19 163 lb 6.4 oz (74.1 kg)  11/14/19 166 lb 0.1 oz (75.3 kg)  11/02/19 161 lb (73 kg)    Physical Exam Constitutional:      General: He is not in acute distress.    Appearance: He is not diaphoretic.  Cardiovascular:     Rate and Rhythm: Normal rate and regular rhythm.     Heart sounds: Normal heart sounds.  Pulmonary:     Effort: Pulmonary effort is normal.     Breath sounds: Normal breath sounds.  Musculoskeletal:     Right lower leg: No edema.     Left lower leg: No edema.  Skin:    General: Skin is warm and dry.  Neurological:     Mental Status: He is alert.      Assessment/Plan: Please see individual problem list.  Acute heart failure with preserved ejection fraction (HFpEF) ([place]) Continues to have some shortness of breath with exertion.  Feels tired and weak.  His lungs are clear.  I suspect the continued symptoms may be related to his low hemoglobin and he will see hematology tomorrow.  He will continue with his Lasix.  He will see cardiology as planned on Friday.  Anemia He will see hematology tomorrow as planned.  He  had a surgery several weeks ago and that may account for the drop in his hemoglobin.  Discussed that hematology can recheck his labs during his visit tomorrow.  I suspect his anemia is contributing to his weakness and tiredness.  Generalized weakness I suspect this is related to deconditioning as well as his anemia and CHF.  Will refer for home health PT and nursing to evaluate.  Hemoptysis Resolved.  Suspect related to CHF exacerbation.  He will monitor for recurrence.  Cognitive decline Patient with some mild memory difficulty.  Discussed that he could try the Provigil to see if it would be beneficial.  Advised that there is not any good data that it is beneficial though it should not harm him.  Neuropathy Chronic issue.  Has been  worsening though gabapentin has been somewhat helpful.  We will have him increase the gabapentin to 300 mg 3 times daily.  He mentions seeing a chiropractor for this and I advised against that given his history of back issues and surgeries.    Orders Placed This Encounter  Procedures  . Ambulatory referral to Home Health    Referral Priority:   Routine    Referral Type:   Home Health Care    Referral Reason:   Specialty Services Required    Requested Specialty:   [place]    Number of Visits Requested:   1    Meds ordered this encounter  Medications  . gabapentin (NEURONTIN) 300 MG capsule    Sig: Take 1 capsule (300 mg total) by mouth 3 (three) times daily.    Dispense:  270 capsule    Refill:  1    This visit occurred during the SARS-CoV-2 public health emergency.  Safety protocols were in place, including screening questions prior to the visit, additional usage of staff PPE, and extensive cleaning of exam room while observing appropriate contact time as indicated for disinfecting solutions.    Bryan Rumps, MD [place]

## 2019-11-21 NOTE — Assessment & Plan Note (Addendum)
He will see hematology tomorrow as planned.  He had a surgery several weeks ago and that may account for the drop in his hemoglobin.  Discussed that hematology can recheck his labs during his visit tomorrow.  I suspect his anemia is contributing to his weakness and tiredness.

## 2019-11-21 NOTE — Assessment & Plan Note (Signed)
Resolved.  Suspect related to CHF exacerbation.  He will monitor for recurrence.

## 2019-11-22 ENCOUNTER — Encounter: Payer: Self-pay | Admitting: Hematology and Oncology

## 2019-11-22 ENCOUNTER — Inpatient Hospital Stay: Payer: Medicare Other

## 2019-11-22 ENCOUNTER — Inpatient Hospital Stay: Payer: Medicare Other | Attending: Hematology and Oncology | Admitting: Hematology and Oncology

## 2019-11-22 VITALS — BP 119/72 | HR 56 | Temp 97.8°F | Resp 18 | Ht 71.5 in | Wt 164.8 lb

## 2019-11-22 DIAGNOSIS — Z79899 Other long term (current) drug therapy: Secondary | ICD-10-CM | POA: Insufficient documentation

## 2019-11-22 DIAGNOSIS — M069 Rheumatoid arthritis, unspecified: Secondary | ICD-10-CM | POA: Diagnosis not present

## 2019-11-22 DIAGNOSIS — D649 Anemia, unspecified: Secondary | ICD-10-CM | POA: Diagnosis not present

## 2019-11-22 DIAGNOSIS — D696 Thrombocytopenia, unspecified: Secondary | ICD-10-CM | POA: Diagnosis not present

## 2019-11-22 DIAGNOSIS — I6523 Occlusion and stenosis of bilateral carotid arteries: Secondary | ICD-10-CM | POA: Diagnosis not present

## 2019-11-22 DIAGNOSIS — D509 Iron deficiency anemia, unspecified: Secondary | ICD-10-CM | POA: Insufficient documentation

## 2019-11-22 LAB — CBC WITH DIFFERENTIAL/PLATELET
Abs Immature Granulocytes: 0.03 10*3/uL (ref 0.00–0.07)
Basophils Absolute: 0 10*3/uL (ref 0.0–0.1)
Basophils Relative: 1 %
Eosinophils Absolute: 0.1 10*3/uL (ref 0.0–0.5)
Eosinophils Relative: 2 %
HCT: 28.9 % — ABNORMAL LOW (ref 39.0–52.0)
Hemoglobin: 9.6 g/dL — ABNORMAL LOW (ref 13.0–17.0)
Immature Granulocytes: 1 %
Lymphocytes Relative: 18 %
Lymphs Abs: 0.7 10*3/uL (ref 0.7–4.0)
MCH: 31.1 pg (ref 26.0–34.0)
MCHC: 33.2 g/dL (ref 30.0–36.0)
MCV: 93.5 fL (ref 80.0–100.0)
Monocytes Absolute: 0.4 10*3/uL (ref 0.1–1.0)
Monocytes Relative: 11 %
Neutro Abs: 2.7 10*3/uL (ref 1.7–7.7)
Neutrophils Relative %: 67 %
Platelets: 271 10*3/uL (ref 150–400)
RBC: 3.09 MIL/uL — ABNORMAL LOW (ref 4.22–5.81)
RDW: 14 % (ref 11.5–15.5)
WBC: 4 10*3/uL (ref 4.0–10.5)
nRBC: 0 % (ref 0.0–0.2)

## 2019-11-22 LAB — FERRITIN: Ferritin: 180 ng/mL (ref 24–336)

## 2019-11-22 LAB — SEDIMENTATION RATE: Sed Rate: 40 mm/hr — ABNORMAL HIGH (ref 0–20)

## 2019-11-22 LAB — IRON AND TIBC
Iron: 49 ug/dL (ref 45–182)
Saturation Ratios: 11 % — ABNORMAL LOW (ref 17.9–39.5)
TIBC: 433 ug/dL (ref 250–450)
UIBC: 384 ug/dL

## 2019-11-22 LAB — RETICULOCYTES
Immature Retic Fract: 17.2 % — ABNORMAL HIGH (ref 2.3–15.9)
RBC.: 3.12 MIL/uL — ABNORMAL LOW (ref 4.22–5.81)
Retic Count, Absolute: 94.2 10*3/uL (ref 19.0–186.0)
Retic Ct Pct: 3 % (ref 0.4–3.1)

## 2019-11-22 LAB — LACTATE DEHYDROGENASE: LDH: 306 U/L — ABNORMAL HIGH (ref 98–192)

## 2019-11-22 NOTE — Progress Notes (Signed)
No new c/o today °

## 2019-11-23 ENCOUNTER — Encounter: Payer: Self-pay | Admitting: Family

## 2019-11-23 ENCOUNTER — Ambulatory Visit (INDEPENDENT_AMBULATORY_CARE_PROVIDER_SITE_OTHER): Payer: Medicare Other | Admitting: Family

## 2019-11-23 ENCOUNTER — Other Ambulatory Visit: Payer: Self-pay

## 2019-11-23 VITALS — BP 100/54 | HR 53 | Ht 71.0 in | Wt 166.4 lb

## 2019-11-23 DIAGNOSIS — I4819 Other persistent atrial fibrillation: Secondary | ICD-10-CM

## 2019-11-23 DIAGNOSIS — E782 Mixed hyperlipidemia: Secondary | ICD-10-CM | POA: Diagnosis not present

## 2019-11-23 DIAGNOSIS — Z952 Presence of prosthetic heart valve: Secondary | ICD-10-CM

## 2019-11-23 DIAGNOSIS — I272 Pulmonary hypertension, unspecified: Secondary | ICD-10-CM

## 2019-11-23 DIAGNOSIS — I10 Essential (primary) hypertension: Secondary | ICD-10-CM

## 2019-11-23 DIAGNOSIS — I5032 Chronic diastolic (congestive) heart failure: Secondary | ICD-10-CM | POA: Diagnosis not present

## 2019-11-23 DIAGNOSIS — Z7901 Long term (current) use of anticoagulants: Secondary | ICD-10-CM

## 2019-11-23 MED ORDER — FUROSEMIDE 40 MG PO TABS: ORAL_TABLET | ORAL | 1 refills | Status: DC

## 2019-11-23 NOTE — Progress Notes (Signed)
Office Visit    Patient Name: Bryan [name] Date of Encounter: 11/23/2019  Primary Care Provider:  [name], MD Primary Cardiologist:  Ida Rogue, MD Electrophysiologist:  None   Chief Complaint    Bryan [name] is a 79 y.o. male with a hx of nonobstructive CAD by [facility] in 2009, bicuspid aortic valve with ascending aortic aneurysm s/p dental procedure with bioprosthetic AVR and ascending aortic graft repair in 2010, PAF, pulmonary hypertension, HTN, HLD, iron deficiency anemia, back pain s/p multiple surgeries most recently 11/02/2019, HFpEF presents today for hospital follow-up  Past Medical History    Past Medical History:  Diagnosis Date  . Abnormal CT scan    Asymmetric left rectal wall thickening   . Anemia   . Anxiety   . Arrhythmia   . Chronic lower back pain   . Complication of anesthesia    Memory loss 09/2015  . Coronary artery disease   . Depression   . GERD (gastroesophageal reflux disease)   . Glaucoma   . H/O thoracic aortic aneurysm repair   . Headache   . Heart murmur   . History of being hospitalized    memory lose kidney funtion down blood pressure up  . History of blood transfusion    "think he had one when he had heart valve OR" (10/14/2016); "none since" (10/19/2017)  . History of chicken pox   . Hyperlipidemia   . Hypertension   . Lumbar stenosis   . Murmur   . Neuropathy   . Osteoarthritis    worse in feet and ankles  . Paroxysmal A-fib ([place])   . Poor short term memory    takes Aricept  . Rheumatoid arthritis ([place])    "all over" (10/14/2016)  . Schizophrenia ([place])   . Seasonal allergies   . Thoracic spine pain   . Valvular heart disease   . Wears glasses    reading   Past Surgical History:  Procedure Laterality Date  . AORTIC VALVE REPLACEMENT  2007   [facility].  [place], [place]; "pig valve"  . APPENDECTOMY  2010  . BACK SURGERY    . CARDIAC VALVE REPLACEMENT    . CATARACT EXTRACTION W/ INTRAOCULAR LENS   IMPLANT, BILATERAL Bilateral 2012  . COLONOSCOPY WITH PROPOFOL N/A 09/24/2016   Procedure: COLONOSCOPY WITH PROPOFOL;  Surgeon: Jonathon Bellows, MD;  Location: [facility] ENDOSCOPY;  Service: Endoscopy;  Laterality: N/A;  . ESOPHAGOGASTRODUODENOSCOPY (EGD) WITH PROPOFOL N/A 09/24/2016   Procedure: ESOPHAGOGASTRODUODENOSCOPY (EGD) WITH PROPOFOL;  Surgeon: Jonathon Bellows, MD;  Location: [facility] ENDOSCOPY;  Service: Endoscopy;  Laterality: N/A;  . GIVENS CAPSULE STUDY N/A 09/24/2016   Procedure: GIVENS CAPSULE STUDY;  Surgeon: Jonathon Bellows, MD;  Location: [facility] ENDOSCOPY;  Service: Endoscopy;  Laterality: N/A;  . HAMMER TOE SURGERY Right 10/09/2015   Procedure: HAMMER TOE REPAIR WITH K-WIRE FIXATION RIGHT SECOND TOE;  Surgeon: Albertine Patricia, DPM;  Location: [place];  Service: Podiatry;  Laterality: Right;  WITH LOCAL  . HARDWARE REMOVAL N/A 11/02/2019   Procedure: Removal of hardware thoracic spine;  Surgeon: Kary Kos, MD;  Location: [place];  Service: Neurosurgery;  Laterality: N/A;  posterior  . INGUINAL HERNIA REPAIR Right 05/05/2018   Medium Bard PerFix plug.  Surgeon: Robert Bellow, MD;  Location: [facility] ORS;  Service: General;  Laterality: Right;  . IR VERTEBROPLASTY CERV/THOR BX INC UNI/BIL INC/INJECT/IMAGING  10/27/2018  . JOINT REPLACEMENT Left    left total knee  . LAMINECTOMY WITH  POSTERIOR LATERAL ARTHRODESIS LEVEL 3 N/A 09/28/2017   Procedure: LUMBAR  POSTEROR  FUSION REVISION - LUMBAR ONE -TWO, LUMBAR TWO -THREE,THREE-FOUR ,BILATERAL ARTHRODESIS REMOVAL LUMBAR THREE HARDWARE;  Surgeon: Kary Kos, MD;  Location: [place];  Service: Neurosurgery;  Laterality: N/A;  . LAMINECTOMY WITH POSTERIOR LATERAL ARTHRODESIS LEVEL 3 N/A 10/20/2017   Procedure: Revision fusion and removal of hardware Lumbar one, Pedicle screw fixation thoracic ten-lumbar two, thoracic nine-ten laminotomy, Posterior Lumbar Arthrodesis thoracic ten-lumbar two ;  Surgeon: Kary Kos, MD;  Location: [place];  Service: Neurosurgery;   Laterality: N/A;  . LAPAROSCOPIC CHOLECYSTECTOMY  2010  . LUMBAR FUSION Right 06/08/2017   LUMBAR THREE-FOUR, LUMBAR FOUR-FIVE POSTEROLATERAL ARTHRODESIS WITH RIGHT LUMBAR FOUR-FIVE LAMINECTOMY/FORAMINOTOMY  . LUMBAR LAMINECTOMY/DECOMPRESSION MICRODISCECTOMY Right 03/08/2016   Procedure: Laminectomy and Foraminotomy - Lumbar Five-Sacral One Right;  Surgeon: Kary Kos, MD;  Location: [place];  Service: Neurosurgery;  Laterality: Right;  Right  . MULTIPLE TOOTH EXTRACTIONS     "3"  . POSTERIOR LUMBAR FUSION  10/14/2016   L5-S1  . REPLACEMENT TOTAL KNEE Left 2003  . THORACIC AORTIC ANEURYSM REPAIR  2010    Allergies  Allergies  Allergen Reactions  . Penicillins Hives, Swelling and Other (See Comments)    SWELLING REACTION UNSPECIFIED PATIENT HAS TAKEN AMOXICILLIN ON MED HX FROM [facility] PATIENT HAS HAD A PCN REACTION WITH IMMEDIATE RASH, FACIAL/TONGUE/THROAT SWELLING, SOB, OR LIGHTHEADEDNESS WITH HYPOTENSION:  #  #  #  YES  #  #  #   Has patient had a PCN reaction causing severe rash involving mucus membranes or skin necrosis: No Has patient had a PCN reaction that required hospitalization No Has patient had a PCN reaction occurring within the last 10 years: No  . Demerol [Meperidine] Hives and Nausea And Vomiting    History of Present Illness    Bryan [name] is a 79 y.o. male with a hx of nonobstructive CAD by [facility] in 2009, bicuspid aortic valve with ascending aortic aneurysm s/p dental procedure with bioprosthetic AVR and ascending aortic graft repair in 2010, PAF, pulmonary hypertension, HTN, HLD, iron deficiency anemia, back pain s/p multiple surgeries most recently 11/02/2019, HFpEF last seen while hospitalized.  Noted bicuspid aortic valve ~9.  Some cardiac cath with mild to moderate nonobstructive CAD.  Underwent Bentall procedure in 2010 with postoperative atrial fibrillation.  Echo 01/2019 LVEF 55 to 60%, indeterminate LV diastolic function parameters, normal RV systolic function  with mildly enlarged RV cavity size, mildly elevated RVSP at 37.5 mmHg, severely dilated LA, mildly dilated aortic root measuring 39 mm.  Noted in a simple dramatic atrial flutter at clinic 02/2019.  His aspirin was decreased to 81 mg daily and he was started on Eliquis.  Seen in follow-up 08/2019 continued asymptomatic atrial fibrillation.  Underwent successful removal of hardware from thoracic spine successful 1/21.  In the perioperative timeframe he held Eliquis and aspirin which were subsequently resumed 11/04/2019.  Presented to [facility] ED 11/12/19 for hemoptysis.  Chest x-ray with symmetrical bilateral airspace disease possibly secondary pulmonary edema versus pneumonia with cardiomegaly and aortic atherosclerosis.  CTA chest was negative for PE with cardiomegaly and small bilateral pleural effusions the pneumonia unable to be excluded.  Dilation of main pulmonary trunk with suggestive pulmonary hypertension.  CBC with hemoglobin 8.1 which had been 11.1 eleven days prior.  He was started on IV Lasix with improvement in his symptoms.  Echocardiogram 11/13/2019 with normal LVEF, mean aortic valve gradient of 26 mmHg (up from 20 mmHg 01/2019),  PA pressure moderately to severely elevated.  He was discharged on Lasix 40 mg twice daily.    He presents today for follow-up with a friend who helps him with many of his medical visits.  Reports feeling overall well since discharge.  Reports some improvement in his dyspnea on exertion since discharge.  He asks whether his dose of Lasix could be reduced.  Tells me his Neurontin was increased at visit with his PCP this week and he notes some "sluggish feeling "which he attributes to the change.  He reports no lower extremity edema.  Reports no chest pain, pressure, tightness.  We reviewed the testing performed in the hospital as well as repeat CBC yesterday by hematology that showed his hemoglobin had improved from 8.5 on day of discharge to 9.6.  He reports no recurrent  hemoptysis since discharge.  He reports no melena, hematuria.  EKGs/Labs/Other Studies Reviewed:   The following studies were reviewed today: Echo 11/13/19  1. Left ventricular ejection fraction, by estimation, is 55 to 60%. The  left ventricle has normal function. The left ventricle has no regional  wall motion abnormalities. There is mild left ventricular hypertrophy.  Left ventricular diastolic parameters  are indeterminate.   2. Right ventricular systolic function is normal. The right ventricular  size is mildly enlarged. There is moderately elevated pulmonary artery  systolic pressure.   3. Left atrial size was moderately dilated.   4. Right atrial size was mildly dilated.   5. The mitral valve is normal in structure. Mild mitral valve  regurgitation.   6. Tricuspid valve regurgitation is moderate.   7. The aortic valve was not well visualized. Aortic valve regurgitation  is trivial. Mild to moderate aortic valve stenosis. Aortic valve mean  gradient measures 26.0 mmHg.   8. Aortic root/ascending aorta has been repaired/replaced. There is mild  dilatation of the aortic root measuring 44 mm.   9. The inferior vena cava is dilated in size with <50% respiratory  variability, suggesting right atrial pressure of 15 mmHg.   EKG:  EKG is ordered today.  The ekg ordered today demonstrates atrial fibrillation 53 bpm with incomplete LBBB and no acute ST/T wave changes.  Recent Labs: 12/19/2018: TSH 0.892 09/11/2019: ALT 16 11/12/2019: B Natriuretic Peptide 461.9 11/14/2019: BUN 25; Creatinine, Ser 1.05; Potassium 3.6; Sodium 136 11/22/2019: Hemoglobin 9.6; Platelets 271  Recent Lipid Panel    Component Value Date/Time   CHOL 123 09/11/2019 1538   TRIG 96 09/11/2019 1538   HDL 58 09/11/2019 1538   CHOLHDL 2.1 09/11/2019 1538   CHOLHDL 2 12/16/2016 1006   VLDL 18.0 12/16/2016 1006   LDLCALC 47 09/11/2019 1538    Home Medications   Current Meds  Medication Sig  . apixaban  (ELIQUIS) 5 MG TABS tablet Take 1 tablet (5 mg total) by mouth 2 (two) times daily.  Marland Kitchen aspirin EC 81 MG tablet Take 1 tablet (81 mg total) by mouth daily. (Patient taking differently: Take 81 mg by mouth daily with supper. )  . atorvastatin (LIPITOR) 40 MG tablet TAKE ONE TABLET BY MOUTH EVERY DAY (Patient taking differently: Take 40 mg by mouth at bedtime. )  . calcium carbonate (OS-CAL - DOSED IN MG OF ELEMENTAL CALCIUM) 1250 (500 Ca) MG tablet Take 1 tablet by mouth daily with lunch.  . Carboxymethylcellul-Glycerin (LUBRICATING EYE DROPS OP) Place 1 drop into both eyes daily as needed (dry eyes).  . cetirizine (ZYRTEC) 10 MG tablet Take 10 mg by mouth daily.  Marland Kitchen  diphenhydrAMINE (BENADRYL) 25 MG tablet Take 25-50 mg by mouth at bedtime as needed for sleep.  Marland Kitchen docusate sodium (COLACE) 100 MG capsule Take 200 mg by mouth at bedtime.  . fenofibrate 160 MG tablet TAKE ONE TABLET AT BEDTIME (Patient taking differently: Take 160 mg by mouth at bedtime. )  . FLUoxetine (PROZAC) 20 MG capsule TAKE 1 CAPSULE BY MOUTH ONCE DAILY (Patient taking differently: Take 20 mg by mouth daily. )  . folic acid (FOLVITE) 1 MG tablet Take 1 mg by mouth in the morning and at bedtime.   . furosemide (LASIX) 40 MG tablet Take 1 tablet (40 mg total) by mouth 2 (two) times daily.  Marland Kitchen gabapentin (NEURONTIN) 300 MG capsule Take 1 capsule (300 mg total) by mouth 3 (three) times daily.  Marland Kitchen HYDROcodone-acetaminophen (NORCO) 10-325 MG tablet Take 1 tablet by mouth 4 (four) times daily as needed for moderate pain.   . hydrocortisone cream 1 % Apply 1 application topically daily as needed for itching.  . hydroxychloroquine (PLAQUENIL) 200 MG tablet Take 200 mg by mouth 3 (three) times a week. Tue, Thurs, and Sat  . lisinopril (ZESTRIL) 20 MG tablet Take 1 tablet (20 mg total) by mouth every evening. (Patient taking differently: Take 20 mg by mouth daily with supper. )  . metoprolol tartrate (LOPRESSOR) 25 MG tablet Take 0.5 tablets  (12.5 mg total) by mouth 2 (two) times daily.  . Multiple Vitamins-Minerals (PRESERVISION AREDS 2) CAPS Take 1 capsule by mouth 2 (two) times daily.  . Omeprazole 20 MG TBEC Take 1 tablet (20 mg total) by mouth daily. (Patient taking differently: Take 1 tablet by mouth daily with lunch. )  . testosterone cypionate (DEPOTESTOSTERONE CYPIONATE) 200 MG/ML injection Inject 0.25 cc every 2 weeks (Patient taking differently: Inject 100 mg into the muscle every 14 (fourteen) days. )  . vitamin B-12 (CYANOCOBALAMIN) 1000 MCG tablet Take 1,000 mcg by mouth daily with lunch.   . vitamin E 180 MG (400 UNITS) capsule Take 800 Units by mouth daily with lunch.      Review of Systems   Review of Systems  Constitutional: Positive for malaise/fatigue. Negative for chills and fever.  Cardiovascular: Positive for dyspnea on exertion. Negative for chest pain, irregular heartbeat, leg swelling, near-syncope, orthopnea, palpitations and syncope.  Respiratory: Negative for cough, shortness of breath and wheezing.   Gastrointestinal: Negative for melena, nausea and vomiting.  Genitourinary: Negative for hematuria.  Neurological: Negative for dizziness, light-headedness and weakness.   All other systems reviewed and are otherwise negative except as noted above.  Physical Exam    VS:  BP (!) 100/54 (BP Location: Left Arm, Patient Position: Sitting, Cuff Size: Normal)   Pulse (!) 53   Ht 5\' 11"  (1.803 m)   Wt 166 lb 6 oz (75.5 kg)   SpO2 96%   BMI 23.20 kg/m  , BMI Body mass index is 23.2 kg/m. GEN: Well nourished, well developed, in no acute distress. HEENT: normal. Neck: Supple, no JVD, carotid bruits, or masses. Cardiac: irregularly irregular, no  rubs, or gallops. Gr 2/6 systolic murmur. No clubbing, cyanosis, edema.  Radials/DP/PT 2+ and equal bilaterally.  Respiratory:  Respirations regular and unlabored, clear to auscultation bilaterally. GI: Soft, nontender, nondistended MS: No deformity or  atrophy. Skin: Warm and dry, no rash. Neuro:  Strength and sensation are intact. Psych: Normal affect.   Assessment & Plan    1. Chronic diastolic heart failure/pulmonary hypertension -echo 11/13/2019 with normal LVEF and elevated PASP.  Euvolemic, and well compensated on exam.  Decrease Lasix to 40 mg daily.  He will take an extra Lasix 40 mg daily as needed for weight gain of 3 pounds overnight or 5 pounds in 1 week.  Low-sodium diet encouraged.   2. Persistent atrial fibrillation-rate well controlled and in fact bradycardic today.  Continue Eliquis 5 mg twice daily-reports no recurrent hemoptysis since hospital discharge.  He reports some fatigue which is likely multifactorial recent hospitalization, increase gabapentin dose but cannot exclude that bradycardia is contributory.  We will reduce Lasix, as above and if fatigue persists consider discontinuation of metoprolol.  3. Anemia -continue to follow with hematology.  Recent hemoglobin 9.5 which is improved compared to hospital discharge.  Recommend dietary sources of iron.  4. Bicuspid aortic valve/ascending aortic aneurysm s/p Bentall procedure 2010 -Echo 11/13/2019 with trivial AI and mild to moderate aortic valve stenosis with aortic valve mean gradient of 26 mmHg.  Grade 2/6 systolic murmur.  5. HLD - LDL 47. Continue statin.   6. HTN - BP low normal. Reduce Lasix, as above.    Disposition: Follow up in 1 month(s) with heart failure clinic at [facility] and in October with Dr. [name] as previously scheduled  [name], NP 11/23/2019, 10:05 AM

## 2019-11-23 NOTE — Patient Instructions (Addendum)
Medication Instructions:  Your physician has recommended you make the following change in your medication:   CHANGE Lasix to 40mg  daily with an extra 40mg  as needed in the afternoon if you gain 3lbs overnight or 5 lbs in 1 week.  *If you need a refill on your cardiac medications before your next appointment, please call your pharmacy*  Lab Work: No lab work today. Your hemoglobin had improved on the blood work you had done yesterday.   Testing/Procedures: Your EKG today shows rate controlled atrial fibrillation.   Follow-Up: At [facility], you and your health needs are our priority.  As part of our continuing mission to provide you with exceptional heart care, we have created designated Provider Care Teams.  These Care Teams include your primary Cardiologist (physician) and Advanced Practice Providers (APPs -  Physician Assistants and Nurse Practitioners) who all work together to provide you with the care you need, when you need it.  We recommend signing up for the patient portal called "MyChart".  Sign up information is provided on this After Visit Summary.  MyChart is used to connect with patients for Virtual Visits (Telemedicine).  Patients are able to view lab/test results, encounter notes, upcoming appointments, etc.  Non-urgent messages can be sent to your provider as well.   To learn more about what you can do with MyChart, go to [email or web address].    Your next appointment:   In August with the Heart Failure clinic - they will call you to arrange. (cancel the one for next week)  In October as previously scheduled with Dr. [name].   Other Instructions If you find you are consistently having to take the extra Lasix tablet in the afternoon, please call our office.   Recommend low sodium diet.   Continue to monitor your blood pressure at home daily. If your blood pressure is consistently less than 110/60, please call our office.   We will reduce your Lasix to help with  your fatigue. This medication was started to help your heart to relax to protect your heart pumping function, control your heart rate and blood pressure. If we find that your heart rate and blood pressure are consistently running low even after changing your Lasix, we will readdress the dose of your Metoprolol.

## 2019-11-27 ENCOUNTER — Telehealth: Payer: Self-pay | Admitting: Family Medicine

## 2019-11-27 NOTE — Telephone Encounter (Signed)
Patient's sister was into office. She would like the referral for home health to be [facility] [phone], only if patient's insurance will cover that facility.

## 2019-11-30 ENCOUNTER — Ambulatory Visit: Payer: Medicare Other | Admitting: Family

## 2019-12-03 ENCOUNTER — Telehealth: Payer: Self-pay

## 2019-12-03 NOTE — Telephone Encounter (Signed)
Spoke with the patient to inform him that his ferritin levels were 180. the patient was understanding and agreeable.

## 2019-12-04 DIAGNOSIS — D649 Anemia, unspecified: Secondary | ICD-10-CM | POA: Diagnosis not present

## 2019-12-04 DIAGNOSIS — I5021 Acute systolic (congestive) heart failure: Secondary | ICD-10-CM | POA: Diagnosis not present

## 2019-12-04 DIAGNOSIS — G609 Hereditary and idiopathic neuropathy, unspecified: Secondary | ICD-10-CM | POA: Diagnosis not present

## 2019-12-04 DIAGNOSIS — Z87891 Personal history of nicotine dependence: Secondary | ICD-10-CM | POA: Diagnosis not present

## 2019-12-04 DIAGNOSIS — J9601 Acute respiratory failure with hypoxia: Secondary | ICD-10-CM | POA: Diagnosis not present

## 2019-12-05 ENCOUNTER — Other Ambulatory Visit: Payer: Self-pay

## 2019-12-05 ENCOUNTER — Inpatient Hospital Stay: Payer: Medicare Other

## 2019-12-05 DIAGNOSIS — Z79899 Other long term (current) drug therapy: Secondary | ICD-10-CM | POA: Diagnosis not present

## 2019-12-05 DIAGNOSIS — D509 Iron deficiency anemia, unspecified: Secondary | ICD-10-CM

## 2019-12-05 DIAGNOSIS — M069 Rheumatoid arthritis, unspecified: Secondary | ICD-10-CM | POA: Diagnosis not present

## 2019-12-05 DIAGNOSIS — D649 Anemia, unspecified: Secondary | ICD-10-CM

## 2019-12-05 LAB — CBC WITH DIFFERENTIAL/PLATELET
Abs Immature Granulocytes: 0.02 10*3/uL (ref 0.00–0.07)
Basophils Absolute: 0 10*3/uL (ref 0.0–0.1)
Basophils Relative: 0 %
Eosinophils Absolute: 0.1 10*3/uL (ref 0.0–0.5)
Eosinophils Relative: 2 %
HCT: 28.8 % — ABNORMAL LOW (ref 39.0–52.0)
Hemoglobin: 9.4 g/dL — ABNORMAL LOW (ref 13.0–17.0)
Immature Granulocytes: 0 %
Lymphocytes Relative: 18 %
Lymphs Abs: 0.8 10*3/uL (ref 0.7–4.0)
MCH: 30.6 pg (ref 26.0–34.0)
MCHC: 32.6 g/dL (ref 30.0–36.0)
MCV: 93.8 fL (ref 80.0–100.0)
Monocytes Absolute: 0.5 10*3/uL (ref 0.1–1.0)
Monocytes Relative: 11 %
Neutro Abs: 3.2 10*3/uL (ref 1.7–7.7)
Neutrophils Relative %: 69 %
Platelets: 139 10*3/uL — ABNORMAL LOW (ref 150–400)
RBC: 3.07 MIL/uL — ABNORMAL LOW (ref 4.22–5.81)
RDW: 14.8 % (ref 11.5–15.5)
WBC: 4.7 10*3/uL (ref 4.0–10.5)
nRBC: 0 % (ref 0.0–0.2)

## 2019-12-05 LAB — IRON AND TIBC
Iron: 55 ug/dL (ref 45–182)
Saturation Ratios: 11 % — ABNORMAL LOW (ref 17.9–39.5)
TIBC: 525 ug/dL — ABNORMAL HIGH (ref 250–450)
UIBC: 470 ug/dL

## 2019-12-05 LAB — TSH: TSH: 1.476 u[IU]/mL (ref 0.350–4.500)

## 2019-12-05 LAB — FERRITIN: Ferritin: 106 ng/mL (ref 24–336)

## 2019-12-05 LAB — DAT, POLYSPECIFIC AHG (ARMC ONLY): Polyspecific AHG test: NEGATIVE

## 2019-12-05 LAB — VITAMIN B12: Vitamin B-12: 482 pg/mL (ref 180–914)

## 2019-12-06 LAB — FOLATE: Folate: 76 ng/mL (ref >5.9)

## 2019-12-06 LAB — HAPTOGLOBIN: Haptoglobin: 156 mg/dL (ref 38–329)

## 2019-12-06 NOTE — Telephone Encounter (Signed)
Referral was fax to [facility].

## 2019-12-07 ENCOUNTER — Telehealth: Payer: Self-pay

## 2019-12-07 ENCOUNTER — Telehealth: Payer: Self-pay | Admitting: Family Medicine

## 2019-12-07 DIAGNOSIS — J9601 Acute respiratory failure with hypoxia: Secondary | ICD-10-CM | POA: Diagnosis not present

## 2019-12-07 DIAGNOSIS — G609 Hereditary and idiopathic neuropathy, unspecified: Secondary | ICD-10-CM | POA: Diagnosis not present

## 2019-12-07 DIAGNOSIS — D649 Anemia, unspecified: Secondary | ICD-10-CM | POA: Diagnosis not present

## 2019-12-07 DIAGNOSIS — Z87891 Personal history of nicotine dependence: Secondary | ICD-10-CM | POA: Diagnosis not present

## 2019-12-07 DIAGNOSIS — I5021 Acute systolic (congestive) heart failure: Secondary | ICD-10-CM | POA: Diagnosis not present

## 2019-12-07 NOTE — Telephone Encounter (Signed)
Caryl Pina from [facility], [phone]. She would like if you would call her.

## 2019-12-07 NOTE — Telephone Encounter (Signed)
Spoke with the patient to inform him that per Dr [name] additional labs show iron def.I have also informed the patient that his platelet counts has dropped and id unrelated to iron def. Per Dr [name] the patient will need to follow up with 2-3 weeks after IV Iron with labs and see MD. Dr [name] may need to investigate his platelet further. I have also informed the patient if he develops increase bruising or bleeding to contact the office. The patient was understanding and agreeable.

## 2019-12-07 NOTE — Telephone Encounter (Signed)
Noted  

## 2019-12-11 ENCOUNTER — Other Ambulatory Visit: Payer: Self-pay

## 2019-12-11 ENCOUNTER — Inpatient Hospital Stay: Payer: Medicare Other

## 2019-12-11 VITALS — BP 146/72 | HR 64 | Temp 97.5°F | Resp 18

## 2019-12-11 DIAGNOSIS — M069 Rheumatoid arthritis, unspecified: Secondary | ICD-10-CM | POA: Diagnosis not present

## 2019-12-11 DIAGNOSIS — Z79899 Other long term (current) drug therapy: Secondary | ICD-10-CM | POA: Diagnosis not present

## 2019-12-11 DIAGNOSIS — D509 Iron deficiency anemia, unspecified: Secondary | ICD-10-CM

## 2019-12-11 MED ORDER — IRON SUCROSE 20 MG/ML IV SOLN
200.0000 mg | Freq: Once | INTRAVENOUS | Status: AC
  Administered 2019-12-11: 200 mg via INTRAVENOUS
  Filled 2019-12-11: qty 10

## 2019-12-11 MED ORDER — SODIUM CHLORIDE 0.9 % IV SOLN
INTRAVENOUS | Status: AC
  Filled 2019-12-11 (×2): qty 250

## 2019-12-12 ENCOUNTER — Ambulatory Visit: Payer: Medicare Other

## 2019-12-12 ENCOUNTER — Telehealth: Payer: Self-pay

## 2019-12-12 NOTE — Telephone Encounter (Signed)
Unable to reach patient for scheduled awv. No answer. Unable to leave a message. Reschedule as appropriate.

## 2019-12-13 DIAGNOSIS — I5021 Acute systolic (congestive) heart failure: Secondary | ICD-10-CM | POA: Diagnosis not present

## 2019-12-13 DIAGNOSIS — J9601 Acute respiratory failure with hypoxia: Secondary | ICD-10-CM | POA: Diagnosis not present

## 2019-12-13 DIAGNOSIS — D649 Anemia, unspecified: Secondary | ICD-10-CM | POA: Diagnosis not present

## 2019-12-13 DIAGNOSIS — Z87891 Personal history of nicotine dependence: Secondary | ICD-10-CM | POA: Diagnosis not present

## 2019-12-13 DIAGNOSIS — G609 Hereditary and idiopathic neuropathy, unspecified: Secondary | ICD-10-CM | POA: Diagnosis not present

## 2019-12-18 ENCOUNTER — Inpatient Hospital Stay: Payer: Medicare Other

## 2019-12-18 ENCOUNTER — Other Ambulatory Visit: Payer: Self-pay

## 2019-12-18 VITALS — BP 114/64 | HR 55 | Temp 97.0°F | Resp 18

## 2019-12-18 DIAGNOSIS — Z79899 Other long term (current) drug therapy: Secondary | ICD-10-CM | POA: Diagnosis not present

## 2019-12-18 DIAGNOSIS — D509 Iron deficiency anemia, unspecified: Secondary | ICD-10-CM | POA: Diagnosis not present

## 2019-12-18 DIAGNOSIS — M069 Rheumatoid arthritis, unspecified: Secondary | ICD-10-CM | POA: Diagnosis not present

## 2019-12-18 MED ORDER — SODIUM CHLORIDE 0.9 % IV SOLN
Freq: Once | INTRAVENOUS | Status: AC
  Filled 2019-12-18: qty 250

## 2019-12-18 MED ORDER — IRON SUCROSE 20 MG/ML IV SOLN
200.0000 mg | Freq: Once | INTRAVENOUS | Status: AC
  Administered 2019-12-18: 200 mg via INTRAVENOUS
  Filled 2019-12-18: qty 10

## 2019-12-19 ENCOUNTER — Other Ambulatory Visit: Payer: Self-pay | Admitting: Family Medicine

## 2019-12-19 ENCOUNTER — Ambulatory Visit (INDEPENDENT_AMBULATORY_CARE_PROVIDER_SITE_OTHER): Payer: Medicare Other

## 2019-12-19 VITALS — BP 112/69 | Ht 71.0 in | Wt 166.0 lb

## 2019-12-19 DIAGNOSIS — I5021 Acute systolic (congestive) heart failure: Secondary | ICD-10-CM | POA: Diagnosis not present

## 2019-12-19 DIAGNOSIS — Z87891 Personal history of nicotine dependence: Secondary | ICD-10-CM | POA: Diagnosis not present

## 2019-12-19 DIAGNOSIS — D649 Anemia, unspecified: Secondary | ICD-10-CM | POA: Diagnosis not present

## 2019-12-19 DIAGNOSIS — G609 Hereditary and idiopathic neuropathy, unspecified: Secondary | ICD-10-CM | POA: Diagnosis not present

## 2019-12-19 DIAGNOSIS — Z0000 Encounter for general adult medical examination without abnormal findings: Secondary | ICD-10-CM | POA: Diagnosis not present

## 2019-12-19 DIAGNOSIS — J9601 Acute respiratory failure with hypoxia: Secondary | ICD-10-CM | POA: Diagnosis not present

## 2019-12-19 NOTE — Progress Notes (Cosign Needed Addendum)
Subjective:   Bryan [name] is a 79 y.o. male who presents for Medicare Annual/Subsequent preventive examination.  Review of Systems    No ROS.  Medicare Wellness Virtual Visit.    Cardiac Risk Factors include: advanced age (>85men, >18 women);male gender;hypertension     Objective:    Today's Vitals   12/19/19 1238  BP: 112/69  Weight: 166 lb (75.3 kg)  Height: 5\' 11"  (1.803 m)   Body mass index is 23.15 kg/m.  Advanced Directives 12/19/2019 11/22/2019 11/12/2019 11/02/2019 06/21/2019 12/21/2018 12/20/2018  Does Patient Have a Medical Advance Directive? Yes Yes No No Yes No No  Type of Paramedic of [place];Living will [place];Living will - - [place];Living will - -  Does patient want to make changes to medical advance directive? No - Patient declined No - Patient declined - - - No - Patient declined No - Patient declined  Copy of [place] in Chart? Yes - validated most recent copy scanned in chart (See row information) Yes - validated most recent copy scanned in chart (See row information) - - - No - copy requested No - copy requested  Would patient like information on creating a medical advance directive? - No - Patient declined No - Patient declined No - Patient declined - No - Patient declined No - Patient declined    Current Medications (verified) Outpatient Encounter Medications as of 12/19/2019  Medication Sig  . apixaban (ELIQUIS) 5 MG TABS tablet Take 1 tablet (5 mg total) by mouth 2 (two) times daily.  Marland Kitchen aspirin EC 81 MG tablet Take 1 tablet (81 mg total) by mouth daily. (Patient taking differently: Take 81 mg by mouth daily with supper. )  . atorvastatin (LIPITOR) 40 MG tablet TAKE ONE TABLET BY MOUTH EVERY DAY (Patient taking differently: Take 40 mg by mouth at bedtime. )  . calcium carbonate (OS-CAL - DOSED IN MG OF ELEMENTAL CALCIUM) 1250 (500 Ca) MG tablet Take 1 tablet by mouth  daily with lunch.  . Carboxymethylcellul-Glycerin (LUBRICATING EYE DROPS OP) Place 1 drop into both eyes daily as needed (dry eyes).  . cetirizine (ZYRTEC) 10 MG tablet Take 10 mg by mouth daily.  . diphenhydrAMINE (BENADRYL) 25 MG tablet Take 25-50 mg by mouth at bedtime as needed for sleep.  Marland Kitchen docusate sodium (COLACE) 100 MG capsule Take 200 mg by mouth at bedtime.  . fenofibrate 160 MG tablet TAKE ONE TABLET AT BEDTIME (Patient taking differently: Take 160 mg by mouth at bedtime. )  . FLUoxetine (PROZAC) 20 MG capsule TAKE 1 CAPSULE BY MOUTH ONCE DAILY (Patient taking differently: Take 20 mg by mouth daily. )  . folic acid (FOLVITE) 1 MG tablet Take 1 mg by mouth in the morning and at bedtime.   . furosemide (LASIX) 40 MG tablet Take 1 tablet (40 mg total) once daily in the morning, take additional tablet in the afternoon if needed for weight gain.  Marland Kitchen gabapentin (NEURONTIN) 300 MG capsule Take 1 capsule (300 mg total) by mouth 3 (three) times daily.  Marland Kitchen HYDROcodone-acetaminophen (NORCO) 10-325 MG tablet Take 1 tablet by mouth 4 (four) times daily as needed for moderate pain.   . hydrocortisone cream 1 % Apply 1 application topically daily as needed for itching.  . hydroxychloroquine (PLAQUENIL) 200 MG tablet Take 200 mg by mouth 3 (three) times a week. Tue, Thurs, and Sat  . lisinopril (ZESTRIL) 20 MG tablet Take 1 tablet (20  mg total) by mouth every evening. (Patient taking differently: Take 20 mg by mouth daily with supper. )  . metoprolol tartrate (LOPRESSOR) 25 MG tablet Take 0.5 tablets (12.5 mg total) by mouth 2 (two) times daily.  . Multiple Vitamins-Minerals (PRESERVISION AREDS 2) CAPS Take 1 capsule by mouth 2 (two) times daily.  . Omeprazole 20 MG TBEC Take 1 tablet (20 mg total) by mouth daily. (Patient taking differently: Take 1 tablet by mouth daily with lunch. )  . testosterone cypionate (DEPOTESTOSTERONE CYPIONATE) 200 MG/ML injection Inject 0.25 cc every 2 weeks (Patient taking  differently: Inject 100 mg into the muscle every 14 (fourteen) days. )  . vitamin B-12 (CYANOCOBALAMIN) 1000 MCG tablet Take 1,000 mcg by mouth daily with lunch.   . vitamin E 180 MG (400 UNITS) capsule Take 800 Units by mouth daily with lunch.   Facility-Administered Encounter Medications as of 12/19/2019  Medication  . 0.9 %  sodium chloride infusion    Allergies (verified) Penicillins and Demerol [meperidine]   History: Past Medical History:  Diagnosis Date  . Abnormal CT scan    Asymmetric left rectal wall thickening   . Anemia   . Anxiety   . Arrhythmia   . Chronic lower back pain   . Complication of anesthesia    Memory loss 09/2015  . Coronary artery disease   . Depression   . GERD (gastroesophageal reflux disease)   . Glaucoma   . H/O thoracic aortic aneurysm repair   . Headache   . Heart murmur   . History of being hospitalized    memory lose kidney funtion down blood pressure up  . History of blood transfusion    "think he had one when he had heart valve OR" (10/14/2016); "none since" (10/19/2017)  . History of chicken pox   . Hyperlipidemia   . Hypertension   . Lumbar stenosis   . Murmur   . Neuropathy   . Osteoarthritis    worse in feet and ankles  . Paroxysmal A-fib ([place])   . Poor short term memory    takes Aricept  . Rheumatoid arthritis ([place])    "all over" (10/14/2016)  . Schizophrenia ([place])   . Seasonal allergies   . Thoracic spine pain   . Valvular heart disease   . Wears glasses    reading   Past Surgical History:  Procedure Laterality Date  . AORTIC VALVE REPLACEMENT  2007   [facility].  [place], [place]; "pig valve"  . APPENDECTOMY  2010  . BACK SURGERY    . CARDIAC VALVE REPLACEMENT    . CATARACT EXTRACTION W/ INTRAOCULAR LENS  IMPLANT, BILATERAL Bilateral 2012  . COLONOSCOPY WITH PROPOFOL N/A 09/24/2016   Procedure: COLONOSCOPY WITH PROPOFOL;  Surgeon: Jonathon Bellows, MD;  [facility];  Service: Endoscopy;  Laterality: N/A;  .  ESOPHAGOGASTRODUODENOSCOPY (EGD) WITH PROPOFOL N/A 09/24/2016   Procedure: ESOPHAGOGASTRODUODENOSCOPY (EGD) WITH PROPOFOL;  Surgeon: Jonathon Bellows, MD;  Location: [facility] ENDOSCOPY;  Service: Endoscopy;  Laterality: N/A;  . GIVENS CAPSULE STUDY N/A 09/24/2016   Procedure: GIVENS CAPSULE STUDY;  Surgeon: Jonathon Bellows, MD;  Location: [facility] ENDOSCOPY;  Service: Endoscopy;  Laterality: N/A;  . HAMMER TOE SURGERY Right 10/09/2015   Procedure: HAMMER TOE REPAIR WITH K-WIRE FIXATION RIGHT SECOND TOE;  Surgeon: Albertine Patricia, DPM;  Location: [place];  Service: Podiatry;  Laterality: Right;  WITH LOCAL  . HARDWARE REMOVAL N/A 11/02/2019   Procedure: Removal of hardware thoracic spine;  Surgeon: Kary Kos,  MD;  Location: [place];  Service: Neurosurgery;  Laterality: N/A;  posterior  . INGUINAL HERNIA REPAIR Right 05/05/2018   Medium Bard PerFix plug.  Surgeon: Robert Bellow, MD;  Location: [facility] ORS;  Service: General;  Laterality: Right;  . IR VERTEBROPLASTY CERV/THOR BX INC UNI/BIL INC/INJECT/IMAGING  10/27/2018  . JOINT REPLACEMENT Left    left total knee  . LAMINECTOMY WITH POSTERIOR LATERAL ARTHRODESIS LEVEL 3 N/A 09/28/2017   Procedure: LUMBAR  POSTEROR  FUSION REVISION - LUMBAR ONE -TWO, LUMBAR TWO -THREE,THREE-FOUR ,BILATERAL ARTHRODESIS REMOVAL LUMBAR THREE HARDWARE;  Surgeon: Kary Kos, MD;  Location: [place];  Service: Neurosurgery;  Laterality: N/A;  . LAMINECTOMY WITH POSTERIOR LATERAL ARTHRODESIS LEVEL 3 N/A 10/20/2017   Procedure: Revision fusion and removal of hardware Lumbar one, Pedicle screw fixation thoracic ten-lumbar two, thoracic nine-ten laminotomy, Posterior Lumbar Arthrodesis thoracic ten-lumbar two ;  Surgeon: Kary Kos, MD;  Location: [place];  Service: Neurosurgery;  Laterality: N/A;  . LAPAROSCOPIC CHOLECYSTECTOMY  2010  . LUMBAR FUSION Right 06/08/2017   LUMBAR THREE-FOUR, LUMBAR FOUR-FIVE POSTEROLATERAL ARTHRODESIS WITH RIGHT LUMBAR FOUR-FIVE LAMINECTOMY/FORAMINOTOMY  . LUMBAR  LAMINECTOMY/DECOMPRESSION MICRODISCECTOMY Right 03/08/2016   Procedure: Laminectomy and Foraminotomy - Lumbar Five-Sacral One Right;  Surgeon: Kary Kos, MD;  Location: [place];  Service: Neurosurgery;  Laterality: Right;  Right  . MULTIPLE TOOTH EXTRACTIONS     "3"  . POSTERIOR LUMBAR FUSION  10/14/2016   L5-S1  . REPLACEMENT TOTAL KNEE Left 2003  . THORACIC AORTIC ANEURYSM REPAIR  2010   Family History  Problem Relation Age of Onset  . Heart failure Mother   . Hypertension Mother   . Asthma Mother   . Osteoporosis Mother   . Heart attack Father 101       MI  . Hypertension Sister   . Prostate cancer Neg Hx   . Bladder Cancer Neg Hx   . Kidney cancer Neg Hx   . Colon cancer Neg Hx    Social History   Socioeconomic History  . Marital status: Widowed    Spouse name: Not on file  . Number of children: Not on file  . Years of education: Not on file  . Highest education level: Not on file  Occupational History  . Occupation: retired  Tobacco Use  . Smoking status: Former Smoker    Packs/day: 1.00    Years: 32.00    Pack years: 32.00    Types: Cigarettes    Quit date: 07/05/1981    Years since quitting: 38.4  . Smokeless tobacco: Never Used  Vaping Use  . Vaping Use: Never used  Substance and Sexual Activity  . Alcohol use: Yes    Comment: "glass of wine/month; if that"  . Drug use: Never  . Sexual activity: Not Currently  Other Topics Concern  . Not on file  Social History Narrative  . Not on file   Social Determinants of Health   Financial Resource Strain: Low Risk   . Difficulty of Paying Living Expenses: Not hard at all  Food Insecurity: No Food Insecurity  . Worried About Charity fundraiser in the Last Year: Never true  . Ran Out of Food in the Last Year: Never true  Transportation Needs:   . Lack of Transportation (Medical):   Marland Kitchen Lack of Transportation (Non-Medical):   Physical Activity: Insufficiently Active  . Days of Exercise per Week: 2 days  .  Minutes of Exercise per Session: 30 min  Stress:   .  Feeling of Stress :   Social Connections: Unknown  . Frequency of Communication with Friends and Family: More than three times a week  . Frequency of Social Gatherings with Friends and Family: More than three times a week  . Attends Religious Services: More than 4 times per year  . Active Member of Clubs or Organizations: Yes  . Attends Archivist Meetings: More than 4 times per year  . Marital Status: Not on file    Tobacco Counseling Counseling given: Not Answered   Clinical Intake:  Pre-visit preparation completed: Yes           How often do you need to have someone help you when you read instructions, pamphlets, or other written materials from your doctor or pharmacy?: 1 - Never   Interpreter Needed?: No      Activities of Daily Living In your present state of health, do you have any difficulty performing the following activities: 12/19/2019 11/13/2019  Hearing? N N  Vision? N N  Difficulty concentrating or making decisions? N N  Walking or climbing stairs? N Y  Dressing or bathing? N N  Doing errands, shopping? N N  Preparing Food and eating ? N -  Using the Toilet? N -  In the past six months, have you accidently leaked urine? N -  Do you have problems with loss of bowel control? N -  Managing your Medications? N -  Managing your Finances? N -  Housekeeping or managing your Housekeeping? Y -  Some recent data might be hidden    Patient Care Team: [name], MD as PCP - General (Family Medicine) Minna Merritts, MD as PCP - Cardiology (Cardiology) Minna Merritts, MD as Consulting Physician (Cardiology) Emmaline Kluver., MD (Rheumatology) Kary Kos, MD as Consulting Physician (Neurosurgery)  Indicate any recent Medical Services you may have received from other than [facility] providers in the past year (date may be approximate).     Assessment:   This is a routine wellness  examination for Bryan [name].  I connected with Bryan [name] today by telephone and verified that I am speaking with the correct person using two identifiers. Location patient: home Location provider: work Persons participating in the virtual visit: patient, Marine scientist.    I discussed the limitations, risks, security and privacy concerns of performing an evaluation and management service by telephone and the availability of in person appointments. The patient expressed understanding and verbally consented to this telephonic visit.    Interactive audio and video telecommunications were attempted between this provider and patient, however failed, due to patient having technical difficulties OR patient did not have access to video capability.  We continued and completed visit with audio only.  Some vital signs may be absent or patient reported.   Hearing/Vision screen  Hearing Screening   125Hz  250Hz  500Hz  1000Hz  2000Hz  3000Hz  4000Hz  6000Hz  8000Hz   Right ear:           Left ear:           Comments: Patient is able to hear conversational tones without difficulty.  No issues reported.    Vision Screening Comments: Followed by  Wears corrective lenses Cataract extraction, bilateral Visual acuity not assessed, virtual visit.  They have seen their ophthalmologist in the last 12 months.   Dietary issues and exercise activities discussed: Current Exercise Habits: Home exercise routine, Time (Minutes): 30, Frequency (Times/Week): 2, Weekly Exercise (Minutes/Week): 60, Intensity: Mild  Healthy diet Good water intake  Goals  Patient Stated   .  Increase physical activity (pt-stated)      Physical therapy sessions      Depression Screen PHQ 2/9 Scores 12/19/2019 01/24/2019 12/11/2018 07/06/2016 01/19/2016 11/28/2015 11/17/2015  PHQ - 2 Score 0 0 0 0 0 0 0  PHQ- 9 Score - 0 - - - - -  Exception Documentation - - - - - - -    Fall Risk Fall Risk  12/19/2019 01/24/2019 12/11/2018 05/11/2018 04/18/2018    Falls in the past year? 0 0 0 0 0  Comment - - - - -  Number falls in past yr: 0 0 - - -  Injury with Fall? - 0 - 0 -  Risk for fall due to : - - - - -  Follow up Falls evaluation completed Falls evaluation completed - - Falls evaluation completed   Handrails in use when climbing stairs? Yes  Home free of loose throw rugs in walkways, pet beds, electrical cords, etc? Yes  Adequate lighting in your home to reduce risk of falls? Yes   ASSISTIVE DEVICES UTILIZED TO PREVENT FALLS:  Life alert? Yes  Use of a cane, walker or w/c? Yes  Grab bars in the bathroom? Yes  Shower chair or bench in shower? Yes  Elevated toilet seat or a handicapped toilet? Yes   TIMED UP AND GO:  Was the test performed? No . Virtual visit.   Cognitive Function: MMSE - Mini Mental State Exam 07/06/2016 06/27/2015  Orientation to time 5 5  Orientation to Place 5 5  Registration 3 3  Attention/ Calculation 5 5  Recall 3 3  Language- name 2 objects 2 2  Language- repeat 1 1  Language- follow 3 step command 3 3  Language- read & follow direction 1 1  Write a sentence 1 1  Copy design 1 1  Total score 30 30     6CIT Screen 12/11/2018  What Year? 0 points  What month? 0 points  What time? 0 points  Count back from 20 0 points  Months in reverse 0 points  Repeat phrase 0 points  Total Score 0    Immunizations Immunization History  Administered Date(s) Administered  . Hepatitis A 06/24/2017  . Hepatitis A, Adult 06/24/2017  . Influenza Split 04/09/2014  . Influenza, High Dose Seasonal PF 05/09/2015, 02/23/2016, 04/28/2017, 03/18/2018  . Influenza,inj,Quad PF,6+ Mos 02/21/2019  . PFIZER SARS-COV-2 Vaccination 06/08/2019, 06/29/2019  . PPD Test 01/10/2017  . Pneumococcal Conjugate-13 12/13/2013  . Pneumococcal Polysaccharide-23 05/09/2015  . Tdap 12/13/2013    Health Maintenance There are no preventive care reminders to display for this patient.  Health Maintenance  Topic Date Due  .  INFLUENZA VACCINE  12/23/2019  . COLONOSCOPY  09/24/2021  . TETANUS/TDAP  12/14/2023  . COVID-19 Vaccine  Completed  . PNA vac Low Risk Adult  Completed    Dental Screening: Recommended annual dental exams for proper oral hygiene  Community Resource Referral / Chronic Care Management: CRR required this visit?  No   CCM required this visit?  No      Plan:   Keep all routine maintenance appointments.   Follow up 02/22/20 @ 10:30  I have personally reviewed and noted the following in the patient's chart:   . Medical and social history . Use of alcohol, tobacco or illicit drugs  . Current medications and supplements . Functional ability and status . Nutritional status . Physical activity . Advanced directives . List of other physicians .  Hospitalizations, surgeries, and ER visits in previous 12 months . Vitals . Screenings to include cognitive, depression, and falls . Referrals and appointments  In addition, I have reviewed and discussed with patient certain preventive protocols, quality metrics, and best practice recommendations. A written personalized care plan for preventive services as well as general preventive health recommendations were provided to patient via mychart.     Varney Biles, LPN   1/95/0932   Agree with plan. Mable Paris, NP

## 2019-12-19 NOTE — Patient Instructions (Addendum)
[name] , Thank you for taking time to come for your Medicare Wellness Visit. I appreciate your ongoing commitment to your health goals. Please review the following plan we discussed and let me know if I can assist you in the future.   These are the goals we discussed: Goals      Patient Stated   .  Increase physical activity (pt-stated)      Physical therapy sessions       This is a list of the screening recommended for you and due dates:  Health Maintenance  Topic Date Due  . Flu Shot  12/23/2019  . Colon Cancer Screening  09/24/2021  . Tetanus Vaccine  12/14/2023  . COVID-19 Vaccine  Completed  . Pneumonia vaccines  Completed    Immunizations Immunization History  Administered Date(s) Administered  . Hepatitis A 06/24/2017  . Hepatitis A, Adult 06/24/2017  . Influenza Split 04/09/2014  . Influenza, High Dose Seasonal PF 05/09/2015, 02/23/2016, 04/28/2017, 03/18/2018  . Influenza,inj,Quad PF,6+ Mos 02/21/2019  . PFIZER SARS-COV-2 Vaccination 06/08/2019, 06/29/2019  . PPD Test 01/10/2017  . Pneumococcal Conjugate-13 12/13/2013  . Pneumococcal Polysaccharide-23 05/09/2015  . Tdap 12/13/2013   Keep all routine maintenance appointments.   Follow up 02/22/20 @ 10:30  Advanced directives: yes, on file  Conditions/risks identified: none new  Follow up in one year for your annual wellness visit.   Preventive Care 81 Years and Older, Male Preventive care refers to lifestyle choices and visits with your health care provider that can promote health and wellness. What does preventive care include?  A yearly physical exam. This is also called an annual well check.  Dental exams once or twice a year.  Routine eye exams. Ask your health care provider how often you should have your eyes checked.  Personal lifestyle choices, including:  Daily care of your teeth and gums.  Regular physical activity.  Eating a healthy diet.  Avoiding tobacco and drug use.   Limiting alcohol use.  Practicing safe sex.  Taking low doses of aspirin every day.  Taking vitamin and mineral supplements as recommended by your health care provider. What happens during an annual well check? The services and screenings done by your health care provider during your annual well check will depend on your age, overall health, lifestyle risk factors, and family history of disease. Counseling  Your health care provider may ask you questions about your:  Alcohol use.  Tobacco use.  Drug use.  Emotional well-being.  Home and relationship well-being.  Sexual activity.  Eating habits.  History of falls.  Memory and ability to understand (cognition).  Work and work Statistician. Screening  You may have the following tests or measurements:  Height, weight, and BMI.  Blood pressure.  Lipid and cholesterol levels. These may be checked every 5 years, or more frequently if you are over 27 years old.  Skin check.  Lung cancer screening. You may have this screening every year starting at age 54 if you have a 30-pack-year history of smoking and currently smoke or have quit within the past 15 years.  Fecal occult blood test (FOBT) of the stool. You may have this test every year starting at age 32.  Flexible sigmoidoscopy or colonoscopy. You may have a sigmoidoscopy every 5 years or a colonoscopy every 10 years starting at age 56.  Prostate cancer screening. Recommendations will vary depending on your family history and other risks.  Hepatitis C blood test.  Hepatitis B blood test.  Sexually transmitted disease (STD) testing.  Diabetes screening. This is done by checking your blood sugar (glucose) after you have not eaten for a while (fasting). You may have this done every 1-3 years.  Abdominal aortic aneurysm (AAA) screening. You may need this if you are a current or former smoker.  Osteoporosis. You may be screened starting at age 50 if you are at high  risk. Talk with your health care provider about your test results, treatment options, and if necessary, the need for more tests. Vaccines  Your health care provider may recommend certain vaccines, such as:  Influenza vaccine. This is recommended every year.  Tetanus, diphtheria, and acellular pertussis (Tdap, Td) vaccine. You may need a Td booster every 10 years.  Zoster vaccine. You may need this after age 43.  Pneumococcal 13-valent conjugate (PCV13) vaccine. One dose is recommended after age 34.  Pneumococcal polysaccharide (PPSV23) vaccine. One dose is recommended after age 87. Talk to your health care provider about which screenings and vaccines you need and how often you need them. This information is not intended to replace advice given to you by your health care provider. Make sure you discuss any questions you have with your health care provider. Document Released: 06/06/2015 Document Revised: 01/28/2016 Document Reviewed: 03/11/2015 Elsevier Interactive Patient Education  2017 [place] Prevention in the Home Falls can cause injuries. They can happen to people of all ages. There are many things you can do to make your home safe and to help prevent falls. What can I do on the outside of my home?  Regularly fix the edges of walkways and driveways and fix any cracks.  Remove anything that might make you trip as you walk through a door, such as a raised step or threshold.  Trim any bushes or trees on the path to your home.  Use bright outdoor lighting.  Clear any walking paths of anything that might make someone trip, such as rocks or tools.  Regularly check to see if handrails are loose or broken. Make sure that both sides of any steps have handrails.  Any raised decks and porches should have guardrails on the edges.  Have any leaves, snow, or ice cleared regularly.  Use sand or salt on walking paths during winter.  Clean up any spills in your garage right  away. This includes oil or grease spills. What can I do in the bathroom?  Use night lights.  Install grab bars by the toilet and in the tub and shower. Do not use towel bars as grab bars.  Use non-skid mats or decals in the tub or shower.  If you need to sit down in the shower, use a plastic, non-slip stool.  Keep the floor dry. Clean up any water that spills on the floor as soon as it happens.  Remove soap buildup in the tub or shower regularly.  Attach bath mats securely with double-sided non-slip rug tape.  Do not have throw rugs and other things on the floor that can make you trip. What can I do in the bedroom?  Use night lights.  Make sure that you have a light by your bed that is easy to reach.  Do not use any sheets or blankets that are too big for your bed. They should not hang down onto the floor.  Have a firm chair that has side arms. You can use this for support while you get dressed.  Do not have throw rugs and other  things on the floor that can make you trip. What can I do in the kitchen?  Clean up any spills right away.  Avoid walking on wet floors.  Keep items that you use a lot in easy-to-reach places.  If you need to reach something above you, use a strong step stool that has a grab bar.  Keep electrical cords out of the way.  Do not use floor polish or wax that makes floors slippery. If you must use wax, use non-skid floor wax.  Do not have throw rugs and other things on the floor that can make you trip. What can I do with my stairs?  Do not leave any items on the stairs.  Make sure that there are handrails on both sides of the stairs and use them. Fix handrails that are broken or loose. Make sure that handrails are as long as the stairways.  Check any carpeting to make sure that it is firmly attached to the stairs. Fix any carpet that is loose or worn.  Avoid having throw rugs at the top or bottom of the stairs. If you do have throw rugs, attach  them to the floor with carpet tape.  Make sure that you have a light switch at the top of the stairs and the bottom of the stairs. If you do not have them, ask someone to add them for you. What else can I do to help prevent falls?  Wear shoes that:  Do not have high heels.  Have rubber bottoms.  Are comfortable and fit you well.  Are closed at the toe. Do not wear sandals.  If you use a stepladder:  Make sure that it is fully opened. Do not climb a closed stepladder.  Make sure that both sides of the stepladder are locked into place.  Ask someone to hold it for you, if possible.  Clearly mark and make sure that you can see:  Any grab bars or handrails.  First and last steps.  Where the edge of each step is.  Use tools that help you move around (mobility aids) if they are needed. These include:  Canes.  Walkers.  Scooters.  Crutches.  Turn on the lights when you go into a dark area. Replace any light bulbs as soon as they burn out.  Set up your furniture so you have a clear path. Avoid moving your furniture around.  If any of your floors are uneven, fix them.  If there are any pets around you, be aware of where they are.  Review your medicines with your doctor. Some medicines can make you feel dizzy. This can increase your chance of falling. Ask your doctor what other things that you can do to help prevent falls. This information is not intended to replace advice given to you by your health care provider. Make sure you discuss any questions you have with your health care provider. Document Released: 03/06/2009 Document Revised: 10/16/2015 Document Reviewed: 06/14/2014 Elsevier Interactive Patient Education  2017 [employer].

## 2019-12-21 DIAGNOSIS — J9601 Acute respiratory failure with hypoxia: Secondary | ICD-10-CM | POA: Diagnosis not present

## 2019-12-21 DIAGNOSIS — G609 Hereditary and idiopathic neuropathy, unspecified: Secondary | ICD-10-CM | POA: Diagnosis not present

## 2019-12-21 DIAGNOSIS — D649 Anemia, unspecified: Secondary | ICD-10-CM | POA: Diagnosis not present

## 2019-12-21 DIAGNOSIS — I5021 Acute systolic (congestive) heart failure: Secondary | ICD-10-CM | POA: Diagnosis not present

## 2019-12-21 DIAGNOSIS — Z87891 Personal history of nicotine dependence: Secondary | ICD-10-CM | POA: Diagnosis not present

## 2019-12-24 DIAGNOSIS — I5021 Acute systolic (congestive) heart failure: Secondary | ICD-10-CM | POA: Diagnosis not present

## 2019-12-24 DIAGNOSIS — J9601 Acute respiratory failure with hypoxia: Secondary | ICD-10-CM | POA: Diagnosis not present

## 2019-12-24 DIAGNOSIS — G609 Hereditary and idiopathic neuropathy, unspecified: Secondary | ICD-10-CM | POA: Diagnosis not present

## 2019-12-24 DIAGNOSIS — D649 Anemia, unspecified: Secondary | ICD-10-CM | POA: Diagnosis not present

## 2019-12-24 DIAGNOSIS — Z87891 Personal history of nicotine dependence: Secondary | ICD-10-CM | POA: Diagnosis not present

## 2019-12-25 ENCOUNTER — Other Ambulatory Visit: Payer: Self-pay

## 2019-12-25 ENCOUNTER — Inpatient Hospital Stay: Payer: Medicare Other | Attending: Hematology and Oncology

## 2019-12-25 VITALS — BP 116/60 | HR 56 | Temp 96.5°F | Resp 18

## 2019-12-25 DIAGNOSIS — D509 Iron deficiency anemia, unspecified: Secondary | ICD-10-CM | POA: Diagnosis not present

## 2019-12-25 DIAGNOSIS — M069 Rheumatoid arthritis, unspecified: Secondary | ICD-10-CM | POA: Diagnosis not present

## 2019-12-25 DIAGNOSIS — Z79899 Other long term (current) drug therapy: Secondary | ICD-10-CM | POA: Diagnosis not present

## 2019-12-25 MED ORDER — SODIUM CHLORIDE 0.9 % IV SOLN
Freq: Once | INTRAVENOUS | Status: AC
  Filled 2019-12-25: qty 250

## 2019-12-25 MED ORDER — IRON SUCROSE 20 MG/ML IV SOLN
200.0000 mg | Freq: Once | INTRAVENOUS | Status: AC
  Administered 2019-12-25: 200 mg via INTRAVENOUS
  Filled 2019-12-25: qty 10

## 2019-12-26 DIAGNOSIS — D649 Anemia, unspecified: Secondary | ICD-10-CM | POA: Diagnosis not present

## 2019-12-26 DIAGNOSIS — J9601 Acute respiratory failure with hypoxia: Secondary | ICD-10-CM | POA: Diagnosis not present

## 2019-12-26 DIAGNOSIS — I5021 Acute systolic (congestive) heart failure: Secondary | ICD-10-CM | POA: Diagnosis not present

## 2019-12-26 DIAGNOSIS — Z87891 Personal history of nicotine dependence: Secondary | ICD-10-CM | POA: Diagnosis not present

## 2019-12-26 DIAGNOSIS — G609 Hereditary and idiopathic neuropathy, unspecified: Secondary | ICD-10-CM | POA: Diagnosis not present

## 2020-01-01 DIAGNOSIS — J9601 Acute respiratory failure with hypoxia: Secondary | ICD-10-CM | POA: Diagnosis not present

## 2020-01-01 DIAGNOSIS — G609 Hereditary and idiopathic neuropathy, unspecified: Secondary | ICD-10-CM | POA: Diagnosis not present

## 2020-01-01 DIAGNOSIS — D649 Anemia, unspecified: Secondary | ICD-10-CM | POA: Diagnosis not present

## 2020-01-01 DIAGNOSIS — I5021 Acute systolic (congestive) heart failure: Secondary | ICD-10-CM | POA: Diagnosis not present

## 2020-01-01 DIAGNOSIS — Z87891 Personal history of nicotine dependence: Secondary | ICD-10-CM | POA: Diagnosis not present

## 2020-01-01 NOTE — Progress Notes (Signed)
Patient ID: Bryan [name], male    DOB: Mar 24, 1941, 79 y.o.   MRN: [id number]  HPI  Bryan [name] is a 79 y/o male with a history of CAD, hyperlipidemia, HTN, paroxysmal atrial fibrillation, anemia, GERD, neuropathy, anxiety, depression, remote tobacco use and chronic heart failure.   Echo report from 11/13/19 reviewed and showed an EF of 55-60% along with mild LVH, moderately elevated PA pressure, mild Bryan & moderate TR.   Admitted 11/12/19 due to pulmonary edema. Cardiology consult obtained. Initially given IV lasix and then transitioned to oral diuretics. Reds clip reading initially 48 down to 40. Discharged after 2 days.   Bryan [name] presents today for his initial visit with a chief complaint of minimal fatigue. Bryan [name] describes this as chronic in nature having been present for several years. Bryan [name] has associated head congestion, feet neuropathy and chronic difficulty sleeping along with this. Bryan [name] denies any dizziness, abdominal distention, palpitations, pedal edema, chest pain, shortness of breath, cough or weight gain.   Has had 3 iron infusions and Bryan [name] feels like they are finally helping his energy level.   Past Medical History:  Diagnosis Date  . Abnormal CT scan    Asymmetric left rectal wall thickening   . Anemia   . Anxiety   . Arrhythmia   . CHF (congestive heart failure) ([place])   . Chronic lower back pain   . Complication of anesthesia    Memory loss 09/2015  . Coronary artery disease   . Depression   . GERD (gastroesophageal reflux disease)   . Glaucoma   . H/O thoracic aortic aneurysm repair   . Headache   . Heart murmur   . History of being hospitalized    memory lose kidney funtion down blood pressure up  . History of blood transfusion    "think Bryan [name] had one when Bryan [name] had heart valve OR" (10/14/2016); "none since" (10/19/2017)  . History of chicken pox   . Hyperlipidemia   . Hypertension   . Lumbar stenosis   . Murmur   . Neuropathy   . Osteoarthritis    worse in feet and ankles   . Paroxysmal A-fib ([place])   . Poor short term memory    takes Aricept  . Rheumatoid arthritis ([place])    "all over" (10/14/2016)  . Schizophrenia ([place])   . Seasonal allergies   . Thoracic spine pain   . Valvular heart disease   . Wears glasses    reading   Past Surgical History:  Procedure Laterality Date  . AORTIC VALVE REPLACEMENT  2007   [facility].  Supply, Bot[place]; "pig valve"  . APPENDECTOMY  2010  . BACK SURGERY    . CARDIAC VALVE REPLACEMENT    . CATARACT EXTRACTION W/ INTRAOCULAR LENS  IMPLANT, BILATERAL Bilateral 2012  . COLONOSCOPY WITH PROPOFOL N/A 09/24/2016   Procedure: COLONOSCOPY WITH PROPOFOL;  Surgeon: Jonathon Bellows, MD;  Location[facility] ENDOSCOPY;  Service: Endoscopy;  Laterality: N/A;  . ESOPHAGOGASTRODUODENOSCOPY (EGD) WITH PROPOFOL N/A 09/24/2016   Procedure: ESOPHAGOGASTRODUODENOSCOPY (EGD) WITH PROPOFOL;  Surgeon: Jonathon Bellows, MD;  Location: [facility] ENDOSCOPY;  Service: Endoscopy;  Laterality: N/A;  . GIVENS CAPSULE STUDY N/A 09/24/2016   Procedure: GIVENS CAPSULE STUDY;  Surgeon: Jonathon Bellows, MD;  Location: [facility] ENDOSCOPY;  Service: Endoscopy;  Laterality: N/A;  . HAMMER TOE SURGERY Right 10/09/2015   Procedure: HAMMER TOE REPAIR WITH K-WIRE FIXATION RIGHT SECOND TOE;  Surgeon: Albertine Patricia, DPM;  Location: [place];  Service: Podiatry;  Laterality:  Right;  WITH LOCAL  . HARDWARE REMOVAL N/A 11/02/2019   Procedure: Removal of hardware thoracic spine;  Surgeon: Kary Kos, MD;  Location: [place];  Service: Neurosurgery;  Laterality: N/A;  posterior  . INGUINAL HERNIA REPAIR Right 05/05/2018   Medium Bard PerFix plug.  Surgeon: Robert Bellow, MD;  Location: [facility] ORS;  Service: General;  Laterality: Right;  . IR VERTEBROPLASTY CERV/THOR BX INC UNI/BIL INC/INJECT/IMAGING  10/27/2018  . JOINT REPLACEMENT Left    left total knee  . LAMINECTOMY WITH POSTERIOR LATERAL ARTHRODESIS LEVEL 3 N/A 09/28/2017   Procedure: LUMBAR  POSTEROR  FUSION REVISION - LUMBAR ONE  -TWO, LUMBAR TWO -THREE,THREE-FOUR ,BILATERAL ARTHRODESIS REMOVAL LUMBAR THREE HARDWARE;  Surgeon: Kary Kos, MD;  Location: [place];  Service: Neurosurgery;  Laterality: N/A;  . LAMINECTOMY WITH POSTERIOR LATERAL ARTHRODESIS LEVEL 3 N/A 10/20/2017   Procedure: Revision fusion and removal of hardware Lumbar one, Pedicle screw fixation thoracic ten-lumbar two, thoracic nine-ten laminotomy, Posterior Lumbar Arthrodesis thoracic ten-lumbar two ;  Surgeon: Kary Kos, MD;  Location: [place];  Service: Neurosurgery;  Laterality: N/A;  . LAPAROSCOPIC CHOLECYSTECTOMY  2010  . LUMBAR FUSION Right 06/08/2017   LUMBAR THREE-FOUR, LUMBAR FOUR-FIVE POSTEROLATERAL ARTHRODESIS WITH RIGHT LUMBAR FOUR-FIVE LAMINECTOMY/FORAMINOTOMY  . LUMBAR LAMINECTOMY/DECOMPRESSION MICRODISCECTOMY Right 03/08/2016   Procedure: Laminectomy and Foraminotomy - Lumbar Five-Sacral One Right;  Surgeon: Kary Kos, MD;  Location: [place];  Service: Neurosurgery;  Laterality: Right;  Right  . MULTIPLE TOOTH EXTRACTIONS     "3"  . POSTERIOR LUMBAR FUSION  10/14/2016   L5-S1  . REPLACEMENT TOTAL KNEE Left 2003  . THORACIC AORTIC ANEURYSM REPAIR  2010   Family History  Problem Relation Age of Onset  . Heart failure Mother   . Hypertension Mother   . Asthma Mother   . Osteoporosis Mother   . Heart attack Father 27       MI  . Hypertension Sister   . Prostate cancer Neg Hx   . Bladder Cancer Neg Hx   . Kidney cancer Neg Hx   . Colon cancer Neg Hx    Social History   Tobacco Use  . Smoking status: Former Smoker    Packs/day: 1.00    Years: 32.00    Pack years: 32.00    Types: Cigarettes    Quit date: 07/05/1981    Years since quitting: 38.5  . Smokeless tobacco: Never Used  Substance Use Topics  . Alcohol use: Yes    Comment: "glass of wine/month; if that"   Allergies  Allergen Reactions  . Penicillins Hives, Swelling and Other (See Comments)    SWELLING REACTION UNSPECIFIED PATIENT HAS TAKEN AMOXICILLIN ON MED HX FROM  [facility] PATIENT HAS HAD A PCN REACTION WITH IMMEDIATE RASH, FACIAL/TONGUE/THROAT SWELLING, SOB, OR LIGHTHEADEDNESS WITH HYPOTENSION:  #  #  #  YES  #  #  #   Has patient had a PCN reaction causing severe rash involving mucus membranes or skin necrosis: No Has patient had a PCN reaction that required hospitalization No Has patient had a PCN reaction occurring within the last 10 years: No  . Demerol [Meperidine] Hives and Nausea And Vomiting   Prior to Admission medications   Medication Sig Start Date End Date Taking? Authorizing Provider  apixaban (ELIQUIS) 5 MG TABS tablet Take 1 tablet (5 mg total) by mouth 2 (two) times daily. 04/12/19  Yes Minna Merritts, MD  aspirin EC 81 MG tablet Take 1 tablet (81 mg total) by mouth  daily. Patient taking differently: Take 81 mg by mouth daily with supper.  03/05/19  Yes Gollan, Kathlene November, MD  atorvastatin (LIPITOR) 40 MG tablet TAKE ONE TABLET BY MOUTH EVERY DAY Patient taking differently: Take 40 mg by mouth at bedtime.  08/27/19  Yes [name], MD  calcium carbonate (OS-CAL - DOSED IN MG OF ELEMENTAL CALCIUM) 1250 (500 Ca) MG tablet Take 1 tablet by mouth daily with lunch.   Yes [provider]  Carboxymethylcellul-Glycerin (LUBRICATING EYE DROPS OP) Place 1 drop into both eyes daily as needed (dry eyes).   Yes [provider]  cetirizine (ZYRTEC) 10 MG tablet Take 10 mg by mouth daily.   Yes [provider]  diphenhydrAMINE (BENADRYL) 25 MG tablet Take 25-50 mg by mouth at bedtime as needed for sleep.   Yes [provider]  docusate sodium (COLACE) 100 MG capsule Take 200 mg by mouth at bedtime.   Yes [provider]  fenofibrate 160 MG tablet TAKE ONE TABLET AT BEDTIME Patient taking differently: Take 160 mg by mouth at bedtime.  09/24/19  Yes Gollan, Kathlene November, MD  FLUoxetine (PROZAC) 20 MG capsule TAKE 1 CAPSULE BY MOUTH ONCE DAILY Patient taking differently: Take 20 mg by mouth daily.  10/18/19  Yes  [name], MD  folic acid (FOLVITE) 1 MG tablet Take 1 mg by mouth in the morning and at bedtime.  07/23/19  Yes [provider]  furosemide (LASIX) 40 MG tablet Take 1 tablet (40 mg total) once daily in the morning, take additional tablet in the afternoon if needed for weight gain. 11/23/19  Yes [name], NP  gabapentin (NEURONTIN) 300 MG capsule Take 1 capsule (300 mg total) by mouth 3 (three) times daily. 11/21/19  Yes [name], MD  HYDROcodone-acetaminophen (NORCO) 10-325 MG tablet Take 1 tablet by mouth 4 (four) times daily as needed for moderate pain.    Yes [provider]  hydrocortisone cream 1 % Apply 1 application topically daily as needed for itching.   Yes [provider]  hydroxychloroquine (PLAQUENIL) 200 MG tablet Take 200 mg by mouth 3 (three) times a week. Tue, Thurs, and Sat   Yes [provider]  lisinopril (ZESTRIL) 20 MG tablet Take 1 tablet (20 mg total) by mouth every evening. Patient taking differently: Take 20 mg by mouth daily with supper.  10/19/19  Yes Minna Merritts, MD  metoprolol tartrate (LOPRESSOR) 25 MG tablet Take 0.5 tablets (12.5 mg total) by mouth 2 (two) times daily. 11/14/19  Yes Fritzi Mandes, MD  Multiple Vitamins-Minerals (PRESERVISION AREDS 2) CAPS Take 1 capsule by mouth 2 (two) times daily.   Yes [provider]  omeprazole (PRILOSEC) 20 MG capsule TAKE 1 CAPSULE EVERY DAY 12/19/19  Yes [name], MD  testosterone cypionate (DEPOTESTOSTERONE CYPIONATE) 200 MG/ML injection Inject 0.25 cc every 2 weeks Patient taking differently: Inject 100 mg into the muscle every 14 (fourteen) days.  04/13/19  Yes McGowan, Larene Beach A, PA-C  vitamin B-12 (CYANOCOBALAMIN) 1000 MCG tablet Take 1,000 mcg by mouth daily with lunch.    Yes [provider]  vitamin E 180 MG (400 UNITS) capsule Take 800 Units by mouth daily with lunch.   Yes [provider]     Review of Systems   Constitutional: Positive for fatigue (improving). Negative for appetite change.  HENT: Positive for congestion and rhinorrhea. Negative for sore throat.   Eyes: Negative.   Respiratory: Negative for cough,  chest tightness and shortness of breath.   Cardiovascular: Negative for chest pain, palpitations and leg swelling.  Gastrointestinal: Negative for abdominal distention and abdominal pain.  Endocrine: Negative.   Genitourinary: Negative.   Musculoskeletal: Positive for back pain. Negative for neck pain.  Skin: Negative.   Allergic/Immunologic: Negative.   Neurological: Positive for numbness (feet neuropathy). Negative for dizziness and light-headedness.  Hematological: Negative for adenopathy. Does not bruise/bleed easily.  Psychiatric/Behavioral: Positive for sleep disturbance (intermittent). Negative for dysphoric mood. The patient is not nervous/anxious.    Vitals:   01/02/20 1146  BP: (!) 149/74  Pulse: (!) 52  Resp: 16  SpO2: 96%  Weight: 162 lb (73.5 kg)  Height: 6' (1.829 m)   Wt Readings from Last 3 Encounters:  01/02/20 162 lb (73.5 kg)  12/19/19 166 lb (75.3 kg)  11/23/19 166 lb 6 oz (75.5 kg)   Lab Results  Component Value Date   CREATININE 1.05 11/14/2019   CREATININE 1.07 11/13/2019   CREATININE 1.41 (H) 11/12/2019    Physical Exam Vitals and nursing note reviewed.  Constitutional:      Appearance: Normal appearance.  HENT:     Head: Normocephalic and atraumatic.  Cardiovascular:     Rate and Rhythm: Bradycardia present. Rhythm irregular.  Pulmonary:     Effort: Pulmonary effort is normal. No respiratory distress.     Breath sounds: No wheezing or rales.  Abdominal:     General: There is no distension.     Palpations: Abdomen is soft.     Tenderness: There is no abdominal tenderness.  Musculoskeletal:        General: No tenderness.     Cervical back: Neck supple.     Right lower leg: No edema.     Left lower leg: No edema.  Skin:    General:  Skin is warm and dry.  Neurological:     General: No focal deficit present.     Mental Status: Bryan [name] is alert and oriented to person, place, and time.  Psychiatric:        Mood and Affect: Mood normal.        Behavior: Behavior normal.        Thought Content: Thought content normal.     Assessment & Plan:  1: Chronic heart failure with preserved ejection fraction with structural changes- - NYHA class II - euvolemic today - weighing daily and says that his weight has been stable; reminded to call for any overnight weight gain of >2 pounds or a weekly weight gain of >5 pounds - not adding salt and has been reading food labels for sodium content.  - saw cardiology Gilford Rile) 11/23/19 - BNP 11/12/19 was 461.9 - reports receiving both COVID vaccines  2: HTN- - BP looks good today - saw PCP Caryl Bis) 11/21/19 - BMP 11/14/19 reviewed and showed sodium 136, potassium 3.6, creatinine 1.05 and GFR >60  3: Paroxysmal atrial fibrillation-   - rate controlled at this time - taking apixaban & metoprolol   Reviewed medication list.  Patient opts to not make a return appointment at this time. Advised patient that Bryan [name] could call back at anytime to make another appointment and patient was comfortable with this plan.

## 2020-01-02 ENCOUNTER — Other Ambulatory Visit: Payer: Self-pay

## 2020-01-02 ENCOUNTER — Ambulatory Visit: Payer: Medicare Other | Attending: Family | Admitting: Family

## 2020-01-02 ENCOUNTER — Telehealth: Payer: Self-pay | Admitting: Family Medicine

## 2020-01-02 ENCOUNTER — Encounter: Payer: Self-pay | Admitting: Family

## 2020-01-02 VITALS — BP 149/74 | HR 52 | Resp 16 | Ht 72.0 in | Wt 162.0 lb

## 2020-01-02 DIAGNOSIS — F419 Anxiety disorder, unspecified: Secondary | ICD-10-CM | POA: Diagnosis not present

## 2020-01-02 DIAGNOSIS — Z8249 Family history of ischemic heart disease and other diseases of the circulatory system: Secondary | ICD-10-CM | POA: Diagnosis not present

## 2020-01-02 DIAGNOSIS — K219 Gastro-esophageal reflux disease without esophagitis: Secondary | ICD-10-CM | POA: Diagnosis not present

## 2020-01-02 DIAGNOSIS — F329 Major depressive disorder, single episode, unspecified: Secondary | ICD-10-CM | POA: Insufficient documentation

## 2020-01-02 DIAGNOSIS — I5032 Chronic diastolic (congestive) heart failure: Secondary | ICD-10-CM | POA: Diagnosis not present

## 2020-01-02 DIAGNOSIS — Z96652 Presence of left artificial knee joint: Secondary | ICD-10-CM | POA: Insufficient documentation

## 2020-01-02 DIAGNOSIS — I2510 Atherosclerotic heart disease of native coronary artery without angina pectoris: Secondary | ICD-10-CM | POA: Diagnosis not present

## 2020-01-02 DIAGNOSIS — G629 Polyneuropathy, unspecified: Secondary | ICD-10-CM | POA: Diagnosis not present

## 2020-01-02 DIAGNOSIS — E785 Hyperlipidemia, unspecified: Secondary | ICD-10-CM | POA: Diagnosis not present

## 2020-01-02 DIAGNOSIS — Z7982 Long term (current) use of aspirin: Secondary | ICD-10-CM | POA: Diagnosis not present

## 2020-01-02 DIAGNOSIS — I10 Essential (primary) hypertension: Secondary | ICD-10-CM

## 2020-01-02 DIAGNOSIS — M069 Rheumatoid arthritis, unspecified: Secondary | ICD-10-CM | POA: Diagnosis not present

## 2020-01-02 DIAGNOSIS — Z952 Presence of prosthetic heart valve: Secondary | ICD-10-CM | POA: Diagnosis not present

## 2020-01-02 DIAGNOSIS — Z981 Arthrodesis status: Secondary | ICD-10-CM | POA: Insufficient documentation

## 2020-01-02 DIAGNOSIS — F209 Schizophrenia, unspecified: Secondary | ICD-10-CM | POA: Diagnosis not present

## 2020-01-02 DIAGNOSIS — Z79899 Other long term (current) drug therapy: Secondary | ICD-10-CM | POA: Diagnosis not present

## 2020-01-02 DIAGNOSIS — I110 Hypertensive heart disease with heart failure: Secondary | ICD-10-CM | POA: Insufficient documentation

## 2020-01-02 DIAGNOSIS — Z7901 Long term (current) use of anticoagulants: Secondary | ICD-10-CM | POA: Insufficient documentation

## 2020-01-02 DIAGNOSIS — I480 Paroxysmal atrial fibrillation: Secondary | ICD-10-CM | POA: Insufficient documentation

## 2020-01-02 DIAGNOSIS — H409 Unspecified glaucoma: Secondary | ICD-10-CM | POA: Diagnosis not present

## 2020-01-02 DIAGNOSIS — L989 Disorder of the skin and subcutaneous tissue, unspecified: Secondary | ICD-10-CM

## 2020-01-02 DIAGNOSIS — Z885 Allergy status to narcotic agent status: Secondary | ICD-10-CM | POA: Insufficient documentation

## 2020-01-02 DIAGNOSIS — Z9049 Acquired absence of other specified parts of digestive tract: Secondary | ICD-10-CM | POA: Insufficient documentation

## 2020-01-02 DIAGNOSIS — Z87891 Personal history of nicotine dependence: Secondary | ICD-10-CM | POA: Diagnosis not present

## 2020-01-02 DIAGNOSIS — Z880 Allergy status to penicillin: Secondary | ICD-10-CM | POA: Diagnosis not present

## 2020-01-02 NOTE — Telephone Encounter (Signed)
Referral placed.

## 2020-01-02 NOTE — Telephone Encounter (Signed)
I called and spoke with the patient and he stated he has not seen any dermatologist here and he would like for it to be local.  Natalea Sutliff,cma

## 2020-01-02 NOTE — Telephone Encounter (Signed)
Patient is requesting a referral to Dermatology, he has a sore on his forehead that will not go away.  Jarielys Girardot,cma

## 2020-01-02 NOTE — Telephone Encounter (Signed)
Has he seen dermatology before? I need to know so I can refer him back to the same person if he has seen dermatology.

## 2020-01-02 NOTE — Patient Instructions (Addendum)
Continue weighing daily and call for an overnight weight gain of > 2 pounds or a weekly weight gain of >5 pounds.   Call us in the future if you'd like to make another appointment 

## 2020-01-02 NOTE — Telephone Encounter (Signed)
Patient is requesting a referral to Dermatology, he has a sore on his forehead that will not go away.

## 2020-01-03 DIAGNOSIS — D649 Anemia, unspecified: Secondary | ICD-10-CM | POA: Diagnosis not present

## 2020-01-03 DIAGNOSIS — Z87891 Personal history of nicotine dependence: Secondary | ICD-10-CM | POA: Diagnosis not present

## 2020-01-03 DIAGNOSIS — I5021 Acute systolic (congestive) heart failure: Secondary | ICD-10-CM | POA: Diagnosis not present

## 2020-01-03 DIAGNOSIS — J9601 Acute respiratory failure with hypoxia: Secondary | ICD-10-CM | POA: Diagnosis not present

## 2020-01-03 DIAGNOSIS — G609 Hereditary and idiopathic neuropathy, unspecified: Secondary | ICD-10-CM | POA: Diagnosis not present

## 2020-01-07 DIAGNOSIS — G609 Hereditary and idiopathic neuropathy, unspecified: Secondary | ICD-10-CM | POA: Diagnosis not present

## 2020-01-07 DIAGNOSIS — D649 Anemia, unspecified: Secondary | ICD-10-CM | POA: Diagnosis not present

## 2020-01-07 DIAGNOSIS — Z87891 Personal history of nicotine dependence: Secondary | ICD-10-CM | POA: Diagnosis not present

## 2020-01-07 DIAGNOSIS — J9601 Acute respiratory failure with hypoxia: Secondary | ICD-10-CM | POA: Diagnosis not present

## 2020-01-07 DIAGNOSIS — I5021 Acute systolic (congestive) heart failure: Secondary | ICD-10-CM | POA: Diagnosis not present

## 2020-01-08 ENCOUNTER — Emergency Department
Admission: EM | Admit: 2020-01-08 | Discharge: 2020-01-08 | Disposition: A | Discharge status: Home / Self Care | Payer: Medicare Other | Attending: Emergency Medicine | Admitting: Emergency Medicine

## 2020-01-08 ENCOUNTER — Other Ambulatory Visit: Payer: Self-pay

## 2020-01-08 ENCOUNTER — Emergency Department: Payer: Medicare Other

## 2020-01-08 DIAGNOSIS — Z5321 Procedure and treatment not carried out due to patient leaving prior to being seen by health care provider: Secondary | ICD-10-CM | POA: Diagnosis not present

## 2020-01-08 DIAGNOSIS — R4701 Aphasia: Secondary | ICD-10-CM | POA: Insufficient documentation

## 2020-01-08 DIAGNOSIS — R0902 Hypoxemia: Secondary | ICD-10-CM | POA: Diagnosis not present

## 2020-01-08 DIAGNOSIS — R404 Transient alteration of awareness: Secondary | ICD-10-CM | POA: Diagnosis not present

## 2020-01-08 DIAGNOSIS — R4182 Altered mental status, unspecified: Secondary | ICD-10-CM | POA: Insufficient documentation

## 2020-01-08 DIAGNOSIS — I10 Essential (primary) hypertension: Secondary | ICD-10-CM | POA: Diagnosis not present

## 2020-01-08 DIAGNOSIS — G9389 Other specified disorders of brain: Secondary | ICD-10-CM | POA: Diagnosis not present

## 2020-01-08 DIAGNOSIS — J3489 Other specified disorders of nose and nasal sinuses: Secondary | ICD-10-CM | POA: Diagnosis not present

## 2020-01-08 DIAGNOSIS — I6782 Cerebral ischemia: Secondary | ICD-10-CM | POA: Diagnosis not present

## 2020-01-08 DIAGNOSIS — R410 Disorientation, unspecified: Secondary | ICD-10-CM | POA: Diagnosis not present

## 2020-01-08 DIAGNOSIS — I7090 Unspecified atherosclerosis: Secondary | ICD-10-CM | POA: Diagnosis not present

## 2020-01-08 LAB — DIFFERENTIAL
Abs Immature Granulocytes: 0.03 10*3/uL (ref 0.00–0.07)
Basophils Absolute: 0 10*3/uL (ref 0.0–0.1)
Basophils Relative: 0 %
Eosinophils Absolute: 0 10*3/uL (ref 0.0–0.5)
Eosinophils Relative: 0 %
Immature Granulocytes: 1 %
Lymphocytes Relative: 9 %
Lymphs Abs: 0.6 10*3/uL — ABNORMAL LOW (ref 0.7–4.0)
Monocytes Absolute: 0.3 10*3/uL (ref 0.1–1.0)
Monocytes Relative: 5 %
Neutro Abs: 5.2 10*3/uL (ref 1.7–7.7)
Neutrophils Relative %: 85 %

## 2020-01-08 LAB — COMPREHENSIVE METABOLIC PANEL
ALT: 18 U/L (ref 0–44)
AST: 31 U/L (ref 15–41)
Albumin: 4.6 g/dL (ref 3.5–5.0)
Alkaline Phosphatase: 63 U/L (ref 38–126)
Anion gap: 12 (ref 5–15)
BUN: 23 mg/dL (ref 8–23)
CO2: 22 mmol/L (ref 22–32)
Calcium: 9.1 mg/dL (ref 8.9–10.3)
Chloride: 101 mmol/L (ref 98–111)
Creatinine, Ser: 1.07 mg/dL (ref 0.61–1.24)
GFR calc Af Amer: >60 mL/min (ref >60)
GFR calc non Af Amer: >60 mL/min (ref >60)
Glucose, Bld: 129 mg/dL — ABNORMAL HIGH (ref 70–99)
Potassium: 4.3 mmol/L (ref 3.5–5.1)
Sodium: 135 mmol/L (ref 135–145)
Total Bilirubin: 1.2 mg/dL (ref 0.3–1.2)
Total Protein: 7.7 g/dL (ref 6.5–8.1)

## 2020-01-08 LAB — PROTIME-INR
INR: 1.2 (ref 0.8–1.2)
Prothrombin Time: 15 seconds (ref 11.4–15.2)

## 2020-01-08 LAB — CBC
HCT: 36.6 % — ABNORMAL LOW (ref 39.0–52.0)
Hemoglobin: 11.6 g/dL — ABNORMAL LOW (ref 13.0–17.0)
MCH: 30.9 pg (ref 26.0–34.0)
MCHC: 31.7 g/dL (ref 30.0–36.0)
MCV: 97.6 fL (ref 80.0–100.0)
Platelets: 131 10*3/uL — ABNORMAL LOW (ref 150–400)
RBC: 3.75 MIL/uL — ABNORMAL LOW (ref 4.22–5.81)
RDW: 15.6 % — ABNORMAL HIGH (ref 11.5–15.5)
WBC: 6.2 10*3/uL (ref 4.0–10.5)
nRBC: 0 % (ref 0.0–0.2)

## 2020-01-08 LAB — APTT: aPTT: 31 seconds (ref 24–36)

## 2020-01-08 LAB — TROPONIN I (HIGH SENSITIVITY): Troponin I (High Sensitivity): 11 ng/L (ref <18)

## 2020-01-08 NOTE — ED Triage Notes (Signed)
Pt arrives to ER with sister who stated he called her at 1:15 today and wasn't making any sense. States was speaking in full sentences but "nothing made sense." then when sister picked him up he wasn't speaking in sentences, "just gibberish." pt able to say name but when asked other questions he states name again instead of DOB or location. Speech clear at this time. Not able to follow directions when asked to squeeze fingers. No arm drift or facial drift noted at this time.

## 2020-01-08 NOTE — ED Notes (Signed)
Pt transported to CT ?

## 2020-01-08 NOTE — ED Triage Notes (Signed)
First nurse note- sister went to check on pt at 1400 and is having expressive aphasia.  No weakness per EMS. CBG 114 with EMS. VSS with EMS.  LKW 1530 yesterday.

## 2020-01-08 NOTE — ED Notes (Signed)
Pt returned from CT at this time to be triaged.

## 2020-01-08 NOTE — ED Notes (Signed)
Pt given cup water. Discussed with dr Kerman Passey and ok to give liquids after swallow study.

## 2020-01-08 NOTE — ED Notes (Signed)
Explained wait time to family.

## 2020-01-09 ENCOUNTER — Encounter: Payer: Self-pay | Admitting: Family Medicine

## 2020-01-09 DIAGNOSIS — R413 Other amnesia: Secondary | ICD-10-CM

## 2020-01-10 NOTE — Telephone Encounter (Signed)
Noted. Overall his labs were pretty benign. He is anemic though it has improved from his last check. His glucose was mildly elevated. If he would be willing it would be a good idea to follow-up in the office to help determine if any further work up is needed.

## 2020-01-14 ENCOUNTER — Inpatient Hospital Stay: Payer: Medicare Other

## 2020-01-14 ENCOUNTER — Other Ambulatory Visit: Payer: Self-pay

## 2020-01-14 DIAGNOSIS — Z79899 Other long term (current) drug therapy: Secondary | ICD-10-CM | POA: Diagnosis not present

## 2020-01-14 DIAGNOSIS — M069 Rheumatoid arthritis, unspecified: Secondary | ICD-10-CM | POA: Diagnosis not present

## 2020-01-14 DIAGNOSIS — D509 Iron deficiency anemia, unspecified: Secondary | ICD-10-CM

## 2020-01-14 LAB — IRON AND TIBC
Iron: 54 ug/dL (ref 45–182)
Saturation Ratios: 14 % — ABNORMAL LOW (ref 17.9–39.5)
TIBC: 398 ug/dL (ref 250–450)
UIBC: 344 ug/dL

## 2020-01-14 LAB — CBC WITH DIFFERENTIAL/PLATELET
Abs Immature Granulocytes: 0.01 10*3/uL (ref 0.00–0.07)
Basophils Absolute: 0 10*3/uL (ref 0.0–0.1)
Basophils Relative: 1 %
Eosinophils Absolute: 0.1 10*3/uL (ref 0.0–0.5)
Eosinophils Relative: 2 %
HCT: 31.7 % — ABNORMAL LOW (ref 39.0–52.0)
Hemoglobin: 10.3 g/dL — ABNORMAL LOW (ref 13.0–17.0)
Immature Granulocytes: 0 %
Lymphocytes Relative: 13 %
Lymphs Abs: 0.5 10*3/uL — ABNORMAL LOW (ref 0.7–4.0)
MCH: 31.1 pg (ref 26.0–34.0)
MCHC: 32.5 g/dL (ref 30.0–36.0)
MCV: 95.8 fL (ref 80.0–100.0)
Monocytes Absolute: 0.5 10*3/uL (ref 0.1–1.0)
Monocytes Relative: 14 %
Neutro Abs: 2.5 10*3/uL (ref 1.7–7.7)
Neutrophils Relative %: 70 %
Platelets: 116 10*3/uL — ABNORMAL LOW (ref 150–400)
RBC: 3.31 MIL/uL — ABNORMAL LOW (ref 4.22–5.81)
RDW: 15.9 % — ABNORMAL HIGH (ref 11.5–15.5)
WBC: 3.6 10*3/uL — ABNORMAL LOW (ref 4.0–10.5)
nRBC: 0 % (ref 0.0–0.2)

## 2020-01-14 LAB — SEDIMENTATION RATE: Sed Rate: 17 mm/hr (ref 0–20)

## 2020-01-14 LAB — FERRITIN: Ferritin: 125 ng/mL (ref 24–336)

## 2020-01-14 NOTE — Progress Notes (Signed)
[facility]  [address] [place], [place] [address] Phone: [phone]  Fax: [phone]   Clinic Day: 01/15/2020   Referring physician: [name], MD  Chief Complaint: Bryan [name] is a 79 y.o. male with arthritis, iron deficiency anemia, and mild pancytopenia who is seen for 7 week assessment.  HPI:  The patient was last seen in the hematology clinic on 11/22/2019. At that time, he felt "ok".  He denied any further hemoptysis.  He remained on Eliquis. Hematocrit was 28.9, hemoglobin 9.6, MCV 93.5, platelets 271,000, WBC 4,000. Ferritin was 180 with an iron saturation of 11% and a TIBC of 433. Sed rate was 40. Reticulocyte count was 3.0%. LDH was 306.  The patient was seen in the [facility] ER on 01/08/2020 for altered mental status. Hematocrit was 36.6, hemoglobin 11.6, MCV 97.6, platelets 131,000, WBC 6,200. Head CT was negative for acute findings. He started to feel better while he was waiting and left before being seen.  Labs followed: 12/05/2019: Hematocrit 28.8, hemoglobin   9.4, MCV 93.8, platelets 139,000, WBC 4,700 (ANC 3,200). Ferritin 106. Iron saturation 11%. TIBC 525.  01/14/2020: Hematocrit 31.7, hemoglobin 10.3, MCV 95.8, platelets 116,000, WBC 3,600 (ANC 2,500). Ferritin 125. Iron saturation 14%. TIBC 398. Sed rate 17.   Labs on 12/05/2019 included: TSH 1.476. Folate 76.0. Vitamin B12 482. Haptoglobin 156. Coombs negative.   The patient received Venofer weekly x 3 (12/11/2019 - 12/25/2019).  During the interim, he has been okay. He has been short of breath with minimal exertion for the past 3 days. He has always uses two pillows and does not wake up short of breath at night.  He denies recent falls, leg swelling, bleeding of any kind, chest pain, or palpitations. He was originally on Lasix BID but one of his doctors decreased his dose to once daily about 2 weeks ago. His arthritis, back pain, and neuropathy are stable.  The  patient is not interested in a repeat bone marrow at this time.   Past Medical History:  Diagnosis Date  . Abnormal CT scan    Asymmetric left rectal wall thickening   . Anemia   . Anxiety   . Arrhythmia   . CHF (congestive heart failure) ([place])   . Chronic lower back pain   . Complication of anesthesia    Memory loss 09/2015  . Coronary artery disease   . Depression   . GERD (gastroesophageal reflux disease)   . Glaucoma   . H/O thoracic aortic aneurysm repair   . Headache   . Heart murmur   . History of being hospitalized    memory lose kidney funtion down blood pressure up  . History of blood transfusion    "think he had one when he had heart valve OR" (10/14/2016); "none since" (10/19/2017)  . History of chicken pox   . Hyperlipidemia   . Hypertension   . Lumbar stenosis   . Murmur   . Neuropathy   . Osteoarthritis    worse in feet and ankles  . Paroxysmal A-fib ([place])   . Poor short term memory    takes Aricept  . Rheumatoid arthritis ([place])    "all over" (10/14/2016)  . Schizophrenia ([place])   . Seasonal allergies   . Thoracic spine pain   . Valvular heart disease   . Wears glasses    reading    Past Surgical History:  Procedure Laterality Date  . AORTIC VALVE REPLACEMENT  2007  [employer].  [place], [address]; "pig valve"  . APPENDECTOMY  2010  . BACK SURGERY    . CARDIAC VALVE REPLACEMENT    . CATARACT EXTRACTION W/ INTRAOCULAR LENS  IMPLANT, BILATERAL Bilateral 2012  . COLONOSCOPY WITH PROPOFOL N/A 09/24/2016   Procedure: COLONOSCOPY WITH PROPOFOL;  Surgeon: Jonathon Bellows, MD;  Location: [facility] ENDOSCOPY;  Service: Endoscopy;  Laterality: N/A;  . ESOPHAGOGASTRODUODENOSCOPY (EGD) WITH PROPOFOL N/A 09/24/2016   Procedure: ESOPHAGOGASTRODUODENOSCOPY (EGD) WITH PROPOFOL;  Surgeon: Jonathon Bellows, MD;  Location: [facility] ENDOSCOPY;  Service: Endoscopy;  Laterality: N/A;  . GIVENS CAPSULE STUDY N/A 09/24/2016   Procedure: GIVENS CAPSULE STUDY;  Surgeon: Jonathon Bellows, MD;  Location:  [facility] ENDOSCOPY;  Service: Endoscopy;  Laterality: N/A;  . HAMMER TOE SURGERY Right 10/09/2015   Procedure: HAMMER TOE REPAIR WITH K-WIRE FIXATION RIGHT SECOND TOE;  Surgeon: Albertine Patricia, DPM;  Location: [place];  Service: Podiatry;  Laterality: Right;  WITH LOCAL  . HARDWARE REMOVAL N/A 11/02/2019   Procedure: Removal of hardware thoracic spine;  Surgeon: Kary Kos, MD;  Location: [place];  Service: Neurosurgery;  Laterality: N/A;  posterior  . INGUINAL HERNIA REPAIR Right 05/05/2018   Medium Bard PerFix plug.  Surgeon: Robert Bellow, MD;  Location: [facility] ORS;  Service: General;  Laterality: Right;  . IR VERTEBROPLASTY CERV/THOR BX INC UNI/BIL INC/INJECT/IMAGING  10/27/2018  . JOINT REPLACEMENT Left    left total knee  . LAMINECTOMY WITH POSTERIOR LATERAL ARTHRODESIS LEVEL 3 N/A 09/28/2017   Procedure: LUMBAR  POSTEROR  FUSION REVISION - LUMBAR ONE -TWO, LUMBAR TWO -THREE,THREE-FOUR ,BILATERAL ARTHRODESIS REMOVAL LUMBAR THREE HARDWARE;  Surgeon: Kary Kos, MD;  Location: [place];  Service: Neurosurgery;  Laterality: N/A;  . LAMINECTOMY WITH POSTERIOR LATERAL ARTHRODESIS LEVEL 3 N/A 10/20/2017   Procedure: Revision fusion and removal of hardware Lumbar one, Pedicle screw fixation thoracic ten-lumbar two, thoracic nine-ten laminotomy, Posterior Lumbar Arthrodesis thoracic ten-lumbar two ;  Surgeon: Kary Kos, MD;  Location: [place];  Service: Neurosurgery;  Laterality: N/A;  . LAPAROSCOPIC CHOLECYSTECTOMY  2010  . LUMBAR FUSION Right 06/08/2017   LUMBAR THREE-FOUR, LUMBAR FOUR-FIVE POSTEROLATERAL ARTHRODESIS WITH RIGHT LUMBAR FOUR-FIVE LAMINECTOMY/FORAMINOTOMY  . LUMBAR LAMINECTOMY/DECOMPRESSION MICRODISCECTOMY Right 03/08/2016   Procedure: Laminectomy and Foraminotomy - Lumbar Five-Sacral One Right;  Surgeon: Kary Kos, MD;  Location: [place];  Service: Neurosurgery;  Laterality: Right;  Right  . MULTIPLE TOOTH EXTRACTIONS     "3"  . POSTERIOR LUMBAR FUSION  10/14/2016   L5-S1  .  REPLACEMENT TOTAL KNEE Left 2003  . THORACIC AORTIC ANEURYSM REPAIR  2010    Family History  Problem Relation Age of Onset  . Heart failure Mother   . Hypertension Mother   . Asthma Mother   . Osteoporosis Mother   . Heart attack Father 65       MI  . Hypertension Sister   . Prostate cancer Neg Hx   . Bladder Cancer Neg Hx   . Kidney cancer Neg Hx   . Colon cancer Neg Hx     Social History:  reports that he quit smoking about 38 years ago. His smoking use included cigarettes. He has a 32.00 pack-year smoking history. He has never used smokeless tobacco. He reports current alcohol use. He reports that he does not use drugs. He smoked 1 pack a day x 12 years. He stopped smoking 30 years ago. He drinks a glass of wine occasionally. He is a Software engineer. [place] wife passed away in 2018/08/01. The patient is alone today.  Allergies:  Allergies  Allergen Reactions  . Penicillins Hives, Swelling and Other (See Comments)    SWELLING REACTION UNSPECIFIED PATIENT HAS TAKEN AMOXICILLIN ON MED HX FROM [facility] PATIENT HAS HAD A PCN REACTION WITH IMMEDIATE RASH, FACIAL/TONGUE/THROAT SWELLING, SOB, OR LIGHTHEADEDNESS WITH HYPOTENSION:  #  #  #  YES  #  #  #   Has patient had a PCN reaction causing severe rash involving mucus membranes or skin necrosis: No Has patient had a PCN reaction that required hospitalization No Has patient had a PCN reaction occurring within the last 10 years: No  . Demerol [Meperidine] Hives and Nausea And Vomiting    Current Medications: Current Outpatient Medications  Medication Sig Dispense Refill  . aspirin EC 81 MG tablet Take 1 tablet (81 mg total) by mouth daily. (Patient taking differently: Take 81 mg by mouth daily with supper. )    . atorvastatin (LIPITOR) 40 MG tablet TAKE ONE TABLET BY MOUTH EVERY DAY (Patient taking differently: Take 40 mg by mouth at bedtime. ) 90 tablet 3  . calcium carbonate (OS-CAL  - DOSED IN MG OF ELEMENTAL CALCIUM) 1250 (500 Ca) MG tablet Take 1 tablet by mouth daily with lunch.    . Carboxymethylcellul-Glycerin (LUBRICATING EYE DROPS OP) Place 1 drop into both eyes daily as needed (dry eyes).    . cetirizine (ZYRTEC) 10 MG tablet Take 10 mg by mouth daily.    . diphenhydrAMINE (BENADRYL) 25 MG tablet Take 25-50 mg by mouth at bedtime as needed for sleep.    Marland Kitchen docusate sodium (COLACE) 100 MG capsule Take 200 mg by mouth at bedtime.    . fenofibrate 160 MG tablet TAKE ONE TABLET AT BEDTIME (Patient taking differently: Take 160 mg by mouth at bedtime. ) 90 tablet 1  . FLUoxetine (PROZAC) 20 MG capsule TAKE 1 CAPSULE BY MOUTH ONCE DAILY (Patient taking differently: Take 20 mg by mouth daily. ) 90 capsule 1  . folic acid (FOLVITE) 1 MG tablet Take 1 mg by mouth in the morning and at bedtime.     . gabapentin (NEURONTIN) 300 MG capsule Take 1 capsule (300 mg total) by mouth 3 (three) times daily. 270 capsule 1  . HYDROcodone-acetaminophen (NORCO) 10-325 MG tablet Take 1 tablet by mouth 4 (four) times daily as needed for moderate pain.     . hydrocortisone cream 1 % Apply 1 application topically daily as needed for itching.    . hydroxychloroquine (PLAQUENIL) 200 MG tablet Take 200 mg by mouth 3 (three) times a week. Tue, Thurs, and Sat    . lisinopril (ZESTRIL) 20 MG tablet Take 1 tablet (20 mg total) by mouth every evening. (Patient taking differently: Take 20 mg by mouth daily with supper. ) 90 tablet 1  . metoprolol tartrate (LOPRESSOR) 25 MG tablet Take 0.5 tablets (12.5 mg total) by mouth 2 (two) times daily. 60 tablet 1  . Multiple Vitamins-Minerals (PRESERVISION AREDS 2) CAPS Take 1 capsule by mouth 2 (two) times daily.    Marland Kitchen omeprazole (PRILOSEC) 20 MG capsule TAKE 1 CAPSULE EVERY DAY 30 capsule 1  . testosterone cypionate (DEPOTESTOSTERONE CYPIONATE) 200 MG/ML injection Inject 0.25 cc every 2 weeks (Patient taking differently: Inject 100 mg into the muscle every 14  (fourteen) days. ) 10 mL 0  . vitamin B-12 (CYANOCOBALAMIN) 1000 MCG tablet Take 1,000 mcg by mouth daily with lunch.     . vitamin  E 180 MG (400 UNITS) capsule Take 800 Units by mouth daily with lunch.    Marland Kitchen apixaban (ELIQUIS) 5 MG TABS tablet Take 1 tablet (5 mg total) by mouth 2 (two) times daily. 180 tablet 2  . furosemide (LASIX) 40 MG tablet Take 1 tablet (40 mg total) once daily in the morning, take additional tablet in the afternoon if needed for weight gain. (Patient not taking: Reported on 01/15/2020) 30 tablet 1   No current facility-administered medications for this visit.   Facility-Administered Medications Ordered in Other Visits  Medication Dose Route Frequency Provider Last Rate Last Admin  . 0.9 %  sodium chloride infusion   Intravenous Continuous Lequita Asal, MD 10 mL/hr at 12/11/19 1123 New Bag at 12/11/19 1123    Review of Systems  Constitutional: Negative for chills, diaphoresis, fever, malaise/fatigue and weight loss (up 16 lbs in 7 days).  HENT: Negative.  Negative for congestion, ear pain, hearing loss, nosebleeds, sinus pain and sore throat.   Eyes: Negative.  Negative for blurred vision, double vision and photophobia.  Respiratory: Positive for shortness of breath (with minimal exertion, x 3 days). Negative for cough, hemoptysis, sputum production and wheezing.   Cardiovascular: Negative.  Negative for chest pain, palpitations, orthopnea, leg swelling and PND.  Gastrointestinal: Negative.  Negative for abdominal pain, blood in stool, constipation, diarrhea, heartburn, melena, nausea and vomiting.       Eating well.  Genitourinary: Negative.  Negative for dysuria, frequency, hematuria and urgency.  Musculoskeletal: Positive for joint pain (rheumatoid arthritis). Negative for back pain, falls, myalgias and neck pain.  Skin: Negative.  Negative for rash.  Neurological: Positive for sensory change (neuropathy, on Neurontin). Negative for dizziness, tingling,  weakness and headaches.  Endo/Heme/Allergies: Negative.  Does not bruise/bleed easily.       Testosterone q 2 weeks.  Psychiatric/Behavioral: Positive for depression (lost his wife in 06/2018). Negative for memory loss and substance abuse. The patient is not nervous/anxious and does not have insomnia.   All other systems reviewed and are negative.  Performance status (ECOG):  1  Vitals Blood pressure (!) 149/68, pulse 62, temperature 97.8 F (36.6 C), temperature source Tympanic, resp. rate 18, height 5\' 11"  (1.803 m), weight 178 lb 5.6 oz (80.9 kg), SpO2 95 %.   Physical Exam Vitals and nursing note reviewed.  Constitutional:      General: He is not in acute distress.    Appearance: He is well-developed. He is not diaphoretic.     Comments: He has a cane by his side.  HENT:     Head: Normocephalic and atraumatic.     Mouth/Throat:     Mouth: Mucous membranes are moist.     Pharynx: Oropharynx is clear. No oropharyngeal exudate.  Eyes:     General: No scleral icterus.    Extraocular Movements: Extraocular movements intact.     Conjunctiva/sclera: Conjunctivae normal.     Pupils: Pupils are equal, round, and reactive to light.     Comments: Hazel eyes.  Neck:     Comments: Prominent neck veins. Cardiovascular:     Rate and Rhythm: Normal rate and regular rhythm.     Heart sounds: Normal heart sounds. No murmur heard.   Pulmonary:     Effort: Pulmonary effort is normal. No respiratory distress.     Breath sounds: No wheezing or rales.     Comments: Dull sounds at right lung base. Bilateral crackles about 1/4 of the way up. Abdominal:  General: Bowel sounds are normal. There is no distension.     Palpations: Abdomen is soft. There is no mass.     Tenderness: There is no abdominal tenderness. There is no guarding or rebound.  Musculoskeletal:        General: No swelling or tenderness. Normal range of motion.     Cervical back: Normal range of motion.     Right lower leg:  No edema.     Left lower leg: No edema.  Lymphadenopathy:     Head:     Right side of head: No preauricular, posterior auricular or occipital adenopathy.     Left side of head: No preauricular, posterior auricular or occipital adenopathy.     Cervical: No cervical adenopathy.     Upper Body:     Right upper body: No supraclavicular or axillary adenopathy.     Left upper body: No supraclavicular or axillary adenopathy.     Lower Body: No right inguinal adenopathy. No left inguinal adenopathy.  Skin:    General: Skin is warm and dry.     Coloration: Skin is not pale.     Findings: No erythema or rash.  Neurological:     Mental Status: He is alert and oriented to person, place, and time. Mental status is at baseline.  Psychiatric:        Mood and Affect: Mood normal.        Behavior: Behavior normal.        Thought Content: Thought content normal.        Judgment: Judgment normal.     Appointment on 01/14/2020  Component Date Value Ref Range Status  . Sed Rate 01/14/2020 17  0 - 20 mm/hr Final   Performed at [facility], [address] [place], [place] [address]  . Iron 01/14/2020 54  45 - 182 ug/dL Final  . TIBC 01/14/2020 398  250 - 450 ug/dL Final  . Saturation Ratios 01/14/2020 14* 17.9 - 39.5 % Final  . UIBC 01/14/2020 344  ug/dL Final   Performed at [facility], [address], [place], [place] [address]  . Ferritin 01/14/2020 125  24 - 336 ng/mL Final   Performed at [facility], [address] [place],  [address]  . WBC 01/14/2020 3.6* 4.0 - 10.5 K/uL Final  . RBC 01/14/2020 3.31* 4.22 - 5.81 MIL/uL Final  . Hemoglobin 01/14/2020 10.3* 13.0 - 17.0 g/dL Final  . HCT 01/14/2020 31.7* 39 - 52 % Final  . MCV 01/14/2020 95.8  80.0 - 100.0 fL Final  . MCH 01/14/2020 31.1  26.0 - 34.0 pg Final  . MCHC 01/14/2020 32.5  30.0 - 36.0 g/dL Final  . RDW 01/14/2020 15.9* 11.5 - 15.5 % Final  . Platelets 01/14/2020 116* 150 - 400 K/uL Final    . nRBC 01/14/2020 0.0  0.0 - 0.2 % Final  . Neutrophils Relative % 01/14/2020 70  % Final  . Neutro Abs 01/14/2020 2.5  1.7 - 7.7 K/uL Final  . Lymphocytes Relative 01/14/2020 13  % Final  . Lymphs Abs 01/14/2020 0.5* 0.7 - 4.0 K/uL Final  . Monocytes Relative 01/14/2020 14  % Final  . Monocytes Absolute 01/14/2020 0.5  0 - 1 K/uL Final  . Eosinophils Relative 01/14/2020 2  % Final  . Eosinophils Absolute 01/14/2020 0.1  0 - 0 K/uL Final  . Basophils Relative 01/14/2020 1  % Final  . Basophils Absolute 01/14/2020 0.0  0 - 0  K/uL Final  . Immature Granulocytes 01/14/2020 0  % Final  . Abs Immature Granulocytes 01/14/2020 0.01  0.00 - 0.07 K/uL Final   Performed at [facility], [address] [place], [place] [address]    Assessment:  Bryan [name] is a 79 y.o. male withrheumatoid arthritisand progressive anemiaover the past 2 years. He has been on Plaquenil and hydralazinewhich can cause anemia. Plaquenil was discontinued on 08/26/2016. He denies any exposure to radiation or toxins. He denies any prior history of hepatitis, prior transfusions or HIV risk factors. He denies any herbal products.  Diet is good. EGDon 09/24/2016 revealed patchy candidiasis in the entire esophagus. There were two non-bleeding angioectasias in the duodenum treated with argon plasma coagulation. Gastritis was biopsied. Pathology revealed mild chronic gastritis negative for H pylori, dysplasia and malignancy. Colonoscopyon 09/24/2016 revealed diverticulosis in the entire colon, one 3 mm polyp in the cecum, and non-bleeding internal hemorrhoids. Pathology from the cecal polyp revealed a tubular adenoma negative for high grade dysplasia and malignancy.   He denies any melena or hematochezia. He denies any hematuria.  CBC on 01/10/2018revealed a hematocrit of 29.5, hemoglobin 10.0, MCV 92.3, platelets 155,000, and WBC 4200. Stool was guaiac negative x 2 in 06/2016. Labs on  01/12/2018revealed a ferritin of 124, iron saturation 22% and TIBC of 370. B12 was 883 on 06/18/2014.  Work-up on 02/26/2018revealed a hematocrit of 31.7, hemoglobin 10.6, MCV 91, platelets 143,000, white count 3000 with an ANC of 1800. Absolute lymphocyte count was 700 (low). Creatinine was 1.57. LDHwas 269 (98-192). Normal labsincluded: uric acid, folate, Coombs, SPEP, and copper. Iron studiesincluded a saturation of 11% (low) and a TIBC of 454 (high) c/wiron deficiency anemia. Sed rate was 19. Retic was 0.9% (low). He is on oral ironwith vitamin C.  Chest, abdomen, and pelvic CTon 08/09/2016 revealed no acute findings or clear evidence of malignancy in the chest, abdomen or pelvis. There was asymmetric left rectal wall thickeningpossibly secondary to volume averaging with the prostate gland.  Bone marrow aspirate and biopsyon 11/04/2016 revealed a normocellular marrow (20%) with trilineage hematopoiesis and maturation. There was mild megakaryocytic atypia. Storage iron was present. There were rare ringed sideroblasts. Cytogeneticsand FISHwere normal (85, XY).  He was admitted to [facility] 10/14/2016 - 10/18/2016 for spinal stenosis of the lumbar region. He underwent redo decompressive lumbar laminotomy L5-S1 with radical foraminotomy the L5 and S1 nerve root with complete medial facetectomies. He underwentback surgery(revision fusion removal hardware and pedicle screw fixation) on 10/20/2017.  He was admitted to [facility] 10/28/2017 - 10/31/2017 with symptomatic anemia. He received 2 units of PRBCs. Hemoglobin improved from 6.2 to 8.1.  Hospital work-up revealed the following normal labs:ferritin (65), B12 (401), TSH, SPEP, haptoglobin. Testosteronewas <3 (low). Iron saturation was 7% with a TIBC of 317. LDH was 205 (98-192). Retic was 2.6%. Creatinine was 1.37 (CrCl 47.3 ml/min).  Peripheral smear revealedleukopenia with normal WBC morphology.  Normocytic anemia with polychromasia, anisocytosis and rouleaux formation.   Work-up on 11/08/2017 revealed a hematocrit of 27.2, hemoglobin 9.0, platelets 200,000, WBC 5800 with an [place] of 4100. Ferritin was 86. Iron saturation was 6% with a TIBC of 382. Sed rate was 56 (0-20). Cold agglutinins were negative. Reticulocyte count was 1.2%. SPEP revealed no monoclonal protein on 10/31/2017.  He received weekly Venoferx 2 (11/25/2017 - 12/02/2017), x 2 (12/23/2017 - 12/30/2017), and x3 (12/11/2019 - 12/25/2019).  Ferritinhas been followed: 106 on 06/24/2017, 65 on 10/30/2017, 86 on 11/08/2017, 124 on 12/12/2017, 106 on 12/15/2017,  95 on 01/20/2018, 133 on 03/10/2018, 90 on 12/19/2018, 138 on 06/20/2019, 180 on 11/22/2019, 106 on 12/05/2019 and 125 on 01/14/2020. Iron saturationwas 6% on 06/24/2017, 6% on 01/20/2018, 11% on 12/05/2019 and 14% on 01/14/2020. Sed rate was 56 on 11/08/2017, 64 on 12/05/2017, and 40 on 11/22/2019.  He has a neuropathic ulcerof the right great toe. He has treated with Septra then switched to doxycycline. He underwent back surgeryon 10/20/2017.  The patient was admitted to [facility] from 11/12/2019-11/14/2019 for hemoptysis, pulmonary edema, and acute hypoxemic respiratory failure. CXR revealed symmetric bilateral airspace disease be secondary to pulmonary edema or pneumonia.   The patient received the [place] COVID-19 vaccines in 05/2019.  Symptomatically,  he has been "ok".  He has been short of breath with minimal exertion for 3 days.  Weight is up 16 pounds in 7 days.  He denies any bleeding.  Exam reveals dullness at the right lung base and bilateral crackles 1/4 of the way up.  Plan: 1.   Review labs from 01/14/2020. 2.  Iron deficiency anemia Hematocrit 39.7.  Hemoglobin 12.9. MCV   97.3 on 06/20/2019.  Hematocrit 35.1.  Hemoglobin 11.1.  MCV 100.3 on 11/02/2019.  Hematocrit 28.9.  Hemoglobin   9.6.  MCV   93.5 on 11/22/2019.  Hematocrit  31.7.  Hemoglobin 10.3.  MCV   95.8 on 01/14/2020             Ferritin 125. Iron saturation 14 % with a TIBC of 398. Retic 3.0% (relatively low) and LDH 306 on 11/22/2019.   B12 was 482 and folate 76 on 12/05/2019.     TSH was 1.476 on 12/05/2019.  Patient denies any bleeding.  As improvement with recent IV iron, discuss 2 additional doses.    Discuss consideration of repeat bone marrow.  Patient declines. 3. Thrombocytopenia Platelet count 144,000 on 06/20/2019.  Platelet count 271,000 on 11/22/2019.  Platelet count 116,000 on 01/14/2020 He is on no new medications or herbal products.               He remains on Plaquenil             Patient declines bone marrow.  Continue to monitor. 4.  Concern for CHF  Patient with weight increase of 16 pounds in the past week.  Exam reveals prominent neck veins and decreased breath sounds at the bases with crackles.  Please reweigh patient.  RN: Dr Caryl Bis or Dr Candis Musa on the phone- done.   Patient will be seen in Dr. Donivan Scull office tomorrow.   Increase Lasix to 40 mg p.o. twice daily. 5.   RTC weekly x 2 for Venofer. 6.   RTC in 4 weeks for MD assessment and labs (CBC with diff, ferritin, CRP, retic).   I discussed the assessment and treatment plan with the patient.  The patient was provided an opportunity to ask questions and all were answered.  The patient agreed with the plan and demonstrated an understanding of the instructions.  The patient was advised to call back if the symptoms worsen or if the condition fails to improve as anticipated.   Lequita Asal, MD, PhD    01/15/2020 , 1:20 PM   I, De Burrs, am acting as a Education administrator for Lequita Asal, MD.  I, [place] [name], MD, have reviewed the above documentation for accuracy and completeness, and I agree with the above.

## 2020-01-15 ENCOUNTER — Encounter: Payer: Self-pay | Admitting: Hematology and Oncology

## 2020-01-15 ENCOUNTER — Telehealth: Payer: Self-pay | Admitting: Cardiovascular Disease

## 2020-01-15 ENCOUNTER — Inpatient Hospital Stay: Payer: Medicare Other

## 2020-01-15 ENCOUNTER — Inpatient Hospital Stay (HOSPITAL_BASED_OUTPATIENT_CLINIC_OR_DEPARTMENT_OTHER): Payer: Medicare Other | Admitting: Hematology and Oncology

## 2020-01-15 VITALS — BP 149/68 | HR 62 | Temp 97.8°F | Resp 18 | Ht 71.0 in | Wt 178.4 lb

## 2020-01-15 DIAGNOSIS — D696 Thrombocytopenia, unspecified: Secondary | ICD-10-CM | POA: Diagnosis not present

## 2020-01-15 DIAGNOSIS — D509 Iron deficiency anemia, unspecified: Secondary | ICD-10-CM

## 2020-01-15 DIAGNOSIS — J9601 Acute respiratory failure with hypoxia: Secondary | ICD-10-CM | POA: Diagnosis not present

## 2020-01-15 DIAGNOSIS — I509 Heart failure, unspecified: Secondary | ICD-10-CM

## 2020-01-15 DIAGNOSIS — D649 Anemia, unspecified: Secondary | ICD-10-CM | POA: Diagnosis not present

## 2020-01-15 DIAGNOSIS — I6523 Occlusion and stenosis of bilateral carotid arteries: Secondary | ICD-10-CM | POA: Diagnosis not present

## 2020-01-15 DIAGNOSIS — G609 Hereditary and idiopathic neuropathy, unspecified: Secondary | ICD-10-CM | POA: Diagnosis not present

## 2020-01-15 DIAGNOSIS — Z79899 Other long term (current) drug therapy: Secondary | ICD-10-CM | POA: Diagnosis not present

## 2020-01-15 DIAGNOSIS — M069 Rheumatoid arthritis, unspecified: Secondary | ICD-10-CM | POA: Diagnosis not present

## 2020-01-15 DIAGNOSIS — Z87891 Personal history of nicotine dependence: Secondary | ICD-10-CM | POA: Diagnosis not present

## 2020-01-15 DIAGNOSIS — I5021 Acute systolic (congestive) heart failure: Secondary | ICD-10-CM | POA: Diagnosis not present

## 2020-01-15 NOTE — Progress Notes (Signed)
The patient c/o increase SOB with movement x 2-3 days

## 2020-01-15 NOTE — Telephone Encounter (Signed)
Dr. [name] spoke with provider regarding this patient.

## 2020-01-15 NOTE — Telephone Encounter (Signed)
Dr. [name] calling to speak with Dr. [name] urgently  Concerned with hf recent ed admission has gain 14+ pounds in a week . Patient c/o sob

## 2020-01-16 ENCOUNTER — Ambulatory Visit (INDEPENDENT_AMBULATORY_CARE_PROVIDER_SITE_OTHER): Payer: Medicare Other | Admitting: Cardiovascular Disease

## 2020-01-16 ENCOUNTER — Encounter: Payer: Self-pay | Admitting: Cardiovascular Disease

## 2020-01-16 ENCOUNTER — Other Ambulatory Visit: Payer: Self-pay

## 2020-01-16 ENCOUNTER — Telehealth: Payer: Self-pay | Admitting: Family Medicine

## 2020-01-16 VITALS — BP 166/84 | HR 59 | Ht 71.0 in | Wt 173.0 lb

## 2020-01-16 DIAGNOSIS — R410 Disorientation, unspecified: Secondary | ICD-10-CM

## 2020-01-16 DIAGNOSIS — I4819 Other persistent atrial fibrillation: Secondary | ICD-10-CM

## 2020-01-16 DIAGNOSIS — I6523 Occlusion and stenosis of bilateral carotid arteries: Secondary | ICD-10-CM | POA: Diagnosis not present

## 2020-01-16 MED ORDER — FUROSEMIDE 40 MG PO TABS: 40.0000 mg | ORAL_TABLET | ORAL | 3 refills | Status: DC

## 2020-01-16 NOTE — Telephone Encounter (Signed)
Spoke with patient and he is aware about the neuro referral

## 2020-01-16 NOTE — Telephone Encounter (Signed)
-----   Message from Minna Merritts, MD sent at 01/16/2020  2:34 PM EDT ----- Had the opportunity to see Bryan [name] today,Family concerned about recent episode of confusion that seem to clear up on its own.  They recommended I send you a message, they would like him to be seen by neurology at [facility].  I mentioned I would pass it on to [facility] was clear for me today, etiology of recent episode unclearThxTG

## 2020-01-16 NOTE — Progress Notes (Signed)
Date:  01/16/2020   ID:  Bryan [name], DOB 12/18/1940, MRN [id number]  Patient Location:  [address] [place]  [address]   Provider location:   [facility], [place] office  PCP:  [name], MD  Cardiologist:  Arvid Right [facility]  Chief Complaint  Patient presents with  . Other    Patient states he is feeling better today and weight is down. Meds reviewed verbally with patient.     History of Present Illness:    Bryan [name] is a 79 y.o. 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 bicuspid aortic valve noted in 2009   cardiac catheterization showing mild to moderate CAD,  AVR with bioprosthetic valve, ascending aorta grafting in 2010,  hypertension,  hyperlipidemia,  mild chronic renal insufficiency, total knee replacement in 2004, appendix rupture and gallbladder surgery in 2010,  postoperative atrial fibrillation in 2010 pulm HTN on echo 2017 Valve with mean gradient 20 mm Hg who presents for routine follow-up of his bioprosthetic aortic valve and dilated ascending aorta   Received a phone call yesterday from oncology, Mild anemia being treated There was measurement of significant weight gain, approximately 14 pounds with shortness of breath, leg swelling, weight was recorded at 178 pounds Baseline weight typically below 160 pounds  Discussed the case with Dr. [name], we recommended he increase Lasix up to 40 twice daily which he did yesterday, weight today 173 pounds Denies any leg swelling, no abdominal distention He does have mild shortness of breath but overall feels well today  Recent visit to the emergency room reviewed He was noted by family to have some confusion January 09, 2020, was seen in the emergency room was not making sense, had a headache, was vomiting Was seen in the emergency room but left secondary to high patient volume and he was feeling  better Feels back to his baseline today  Prior review of weights over the past 6 months Seen in CHF clinic January 02, 2020, weight at that time 162 pounds Weight  November 23, 2019 166 pounds Weight April 2021 was 160 pounds Weight in oncology 178 pounds performed yesterday His home weight today 168 pounds but is 173 pounds in our office  Recent imaging reviewed with him Echocardiogram November 13, 2019 performed for shortness of breath Normal ejection fraction, moderate pulmonary hypertension Moderate aortic valve stenosis Mean gradient 26 mmHg Dilated IVC  CT scan chest June 2021 with mild/small pleural effusion  Family who presents with him today, Would like to be seen at [facility] neurology for recent confusion  Blood pressure elevated today, BP at home 130-140/60 per the patient Remains 160 on my recheck  Chronic back pain, multiple surgery "better" had hardware taken out  Lost his wife , family helping  Testosterone being followed by urology  No sx from atrial fib  REDS VEST performed today, measurement of 37  EKG personally reviewed by myself on todays visit Shows atrial fibrillation ventricular rate 59 bpm left axis deviation no significant ST-abnormality, there is T wave inversions in lead III and aVF, previously seen on EKG October 2020  Other past medical history reviewed Last  Echo 01/2019  . The left ventricle has normal systolic function, with an ejection fraction of 55-60%. The cavity size was normal. Left ventricular diastolic Doppler parameters are indeterminate.  2. The right ventricle has normal systolic function.   3. Left atrial size was  severely dilated.  There is mild dilatation of the aortic root measuring 39 mm. The aortic valve has been repaired/replaced Aortic valve  regurgitation was not visualized by color flow Doppler. bioprosthetic  aortic valve, mean gradient 67mmHg.  (stable compared to 2017)  Other past medical history reviewed Several back  surgeries Back surgery  5/18 chroni back and leg pain, Fx of L4, Cement  Fusion    Past Medical History:  Diagnosis Date  . Abnormal CT scan    Asymmetric left rectal wall thickening   . Anemia   . Anxiety   . Arrhythmia   . CHF (congestive heart failure) ([place])   . Chronic lower back pain   . Complication of anesthesia    Memory loss 09/2015  . Coronary artery disease   . Depression   . GERD (gastroesophageal reflux disease)   . Glaucoma   . H/O thoracic aortic aneurysm repair   . Headache   . Heart murmur   . History of being hospitalized    memory lose kidney funtion down blood pressure up  . History of blood transfusion    "think he had one when he had heart valve OR" (10/14/2016); "none since" (10/19/2017)  . History of chicken pox   . Hyperlipidemia   . Hypertension   . Lumbar stenosis   . Murmur   . Neuropathy   . Osteoarthritis    worse in feet and ankles  . Paroxysmal A-fib ([place])   . Poor short term memory    takes Aricept  . Rheumatoid arthritis ([place])    "all over" (10/14/2016)  . Schizophrenia ([place])   . Seasonal allergies   . Thoracic spine pain   . Valvular heart disease   . Wears glasses    reading   Past Surgical History:  Procedure Laterality Date  . AORTIC VALVE REPLACEMENT  2007   [facility].  [place], [place]; "pig valve"  . APPENDECTOMY  2010  . BACK SURGERY    . CARDIAC VALVE REPLACEMENT    . CATARACT EXTRACTION W/ INTRAOCULAR LENS  IMPLANT, BILATERAL Bilateral 2012  . COLONOSCOPY WITH PROPOFOL N/A 09/24/2016   Procedure: COLONOSCOPY WITH PROPOFOL;  Surgeon: Jonathon Bellows, MD;  Location: [facility] ENDOSCOPY;  Service: Endoscopy;  Laterality: N/A;  . ESOPHAGOGASTRODUODENOSCOPY (EGD) WITH PROPOFOL N/A 09/24/2016   Procedure: ESOPHAGOGASTRODUODENOSCOPY (EGD) WITH PROPOFOL;  Surgeon: Jonathon Bellows, MD;  Location: [facility] ENDOSCOPY;  Service: Endoscopy;  Laterality: N/A;  . GIVENS CAPSULE STUDY N/A 09/24/2016   Procedure: GIVENS CAPSULE STUDY;  Surgeon: Jonathon Bellows, MD;  Location: [facility] ENDOSCOPY;  Service: Endoscopy;  Laterality: N/A;  . HAMMER TOE SURGERY Right 10/09/2015   Procedure: HAMMER TOE REPAIR WITH K-WIRE FIXATION RIGHT SECOND TOE;  Surgeon: Albertine Patricia, DPM;  Location: [place];  Service: Podiatry;  Laterality: Right;  WITH LOCAL  . HARDWARE REMOVAL N/A 11/02/2019   Procedure: Removal of hardware thoracic spine;  Surgeon: Kary Kos, MD;  Location: [place];  Service: Neurosurgery;  Laterality: N/A;  posterior  . INGUINAL HERNIA REPAIR Right 05/05/2018   Medium Bard PerFix plug.  Surgeon: Robert Bellow, MD;  Location: [facility] ORS;  Service: General;  Laterality: Right;  . IR VERTEBROPLASTY CERV/THOR BX INC UNI/BIL INC/INJECT/IMAGING  10/27/2018  . JOINT REPLACEMENT Left    left total knee  . LAMINECTOMY WITH POSTERIOR LATERAL ARTHRODESIS LEVEL 3 N/A 09/28/2017   Procedure: LUMBAR  POSTEROR  FUSION REVISION - LUMBAR ONE -TWO, LUMBAR TWO -THREE,THREE-FOUR ,BILATERAL ARTHRODESIS REMOVAL LUMBAR THREE HARDWARE;  Surgeon: Kary Kos, MD;  Location: [place];  Service: Neurosurgery;  Laterality: N/A;  . LAMINECTOMY WITH POSTERIOR LATERAL ARTHRODESIS LEVEL 3 N/A 10/20/2017   Procedure: Revision fusion and removal of hardware Lumbar one, Pedicle screw fixation thoracic ten-lumbar two, thoracic nine-ten laminotomy, Posterior Lumbar Arthrodesis thoracic ten-lumbar two ;  Surgeon: Kary Kos, MD;  Location: [place];  Service: Neurosurgery;  Laterality: N/A;  . LAPAROSCOPIC CHOLECYSTECTOMY  2010  . LUMBAR FUSION Right 06/08/2017   LUMBAR THREE-FOUR, LUMBAR FOUR-FIVE POSTEROLATERAL ARTHRODESIS WITH RIGHT LUMBAR FOUR-FIVE LAMINECTOMY/FORAMINOTOMY  . LUMBAR LAMINECTOMY/DECOMPRESSION MICRODISCECTOMY Right 03/08/2016   Procedure: Laminectomy and Foraminotomy - Lumbar Five-Sacral One Right;  Surgeon: Kary Kos, MD;  Location: [place];  Service: Neurosurgery;  Laterality: Right;  Right  . MULTIPLE TOOTH EXTRACTIONS     "3"  . POSTERIOR LUMBAR FUSION   10/14/2016   L5-S1  . REPLACEMENT TOTAL KNEE Left 2003  . THORACIC AORTIC ANEURYSM REPAIR  2010     Current Meds  Medication Sig  . apixaban (ELIQUIS) 5 MG TABS tablet Take 1 tablet (5 mg total) by mouth 2 (two) times daily.  Marland Kitchen aspirin EC 81 MG tablet Take 1 tablet (81 mg total) by mouth daily. (Patient taking differently: Take 81 mg by mouth daily with supper. )  . atorvastatin (LIPITOR) 40 MG tablet TAKE ONE TABLET BY MOUTH EVERY DAY (Patient taking differently: Take 40 mg by mouth at bedtime. )  . calcium carbonate (OS-CAL - DOSED IN MG OF ELEMENTAL CALCIUM) 1250 (500 Ca) MG tablet Take 1 tablet by mouth daily with lunch.  . Carboxymethylcellul-Glycerin (LUBRICATING EYE DROPS OP) Place 1 drop into both eyes daily as needed (dry eyes).  . cetirizine (ZYRTEC) 10 MG tablet Take 10 mg by mouth daily.  . diphenhydrAMINE (BENADRYL) 25 MG tablet Take 25-50 mg by mouth at bedtime as needed for sleep.  Marland Kitchen docusate sodium (COLACE) 100 MG capsule Take 200 mg by mouth at bedtime.  . fenofibrate 160 MG tablet TAKE ONE TABLET AT BEDTIME (Patient taking differently: Take 160 mg by mouth at bedtime. )  . FLUoxetine (PROZAC) 20 MG capsule TAKE 1 CAPSULE BY MOUTH ONCE DAILY (Patient taking differently: Take 20 mg by mouth daily. )  . folic acid (FOLVITE) 1 MG tablet Take 1 mg by mouth in the morning and at bedtime.   . gabapentin (NEURONTIN) 300 MG capsule Take 1 capsule (300 mg total) by mouth 3 (three) times daily.  Marland Kitchen HYDROcodone-acetaminophen (NORCO) 10-325 MG tablet Take 1 tablet by mouth 4 (four) times daily as needed for moderate pain.   . hydrocortisone cream 1 % Apply 1 application topically daily as needed for itching.  . hydroxychloroquine (PLAQUENIL) 200 MG tablet Take 200 mg by mouth 3 (three) times a week. Tue, Thurs, and Sat  . lisinopril (ZESTRIL) 20 MG tablet Take 1 tablet (20 mg total) by mouth every evening. (Patient taking differently: Take 20 mg by mouth daily with supper. )  . metoprolol  tartrate (LOPRESSOR) 25 MG tablet Take 0.5 tablets (12.5 mg total) by mouth 2 (two) times daily.  . Multiple Vitamins-Minerals (PRESERVISION AREDS 2) CAPS Take 1 capsule by mouth 2 (two) times daily.  Marland Kitchen testosterone cypionate (DEPOTESTOSTERONE CYPIONATE) 200 MG/ML injection Inject 0.25 cc every 2 weeks (Patient taking differently: Inject 100 mg into the muscle every 14 (fourteen) days. )  . vitamin B-12 (CYANOCOBALAMIN) 1000 MCG tablet Take 1,000 mcg by mouth daily with lunch.   . vitamin E 180 MG (400 UNITS) capsule Take  800 Units by mouth daily with lunch.  . [DISCONTINUED] furosemide (LASIX) 40 MG tablet Take 1 tablet (40 mg total) once daily in the morning, take additional tablet in the afternoon if needed for weight gain.     Allergies:   Penicillins and Demerol [meperidine]   Social History   Tobacco Use  . Smoking status: Former Smoker    Packs/day: 1.00    Years: 32.00    Pack years: 32.00    Types: Cigarettes    Quit date: 07/05/1981    Years since quitting: 38.5  . Smokeless tobacco: Never Used  Vaping Use  . Vaping Use: Never used  Substance Use Topics  . Alcohol use: Yes    Comment: "glass of wine/month; if that"  . Drug use: Never     Family Hx: The patient's family history includes Asthma in his mother; Heart attack (age of onset: 77) in his father; Heart failure in his mother; Hypertension in his mother and sister; Osteoporosis in his mother. There is no history of Prostate cancer, Bladder Cancer, Kidney cancer, or Colon cancer.  ROS:   Please see the history of present illness.    Review of Systems  Constitutional: Negative.        Weight gain  HENT: Negative.   Respiratory: Positive for shortness of breath.   Cardiovascular: Negative.   Gastrointestinal: Negative.   Musculoskeletal: Negative.   Neurological: Negative.   Psychiatric/Behavioral: Negative.   All other systems reviewed and are negative.    Labs/Other Tests and Data Reviewed:    Recent  Labs: 11/12/2019: B Natriuretic Peptide 461.9 12/05/2019: TSH 1.476 01/08/2020: ALT 18; BUN 23; Creatinine, Ser 1.07; Potassium 4.3; Sodium 135 01/14/2020: Hemoglobin 10.3; Platelets 116   Recent Lipid Panel Lab Results  Component Value Date/Time   CHOL 123 09/11/2019 03:38 PM   TRIG 96 09/11/2019 03:38 PM   HDL 58 09/11/2019 03:38 PM   CHOLHDL 2.1 09/11/2019 03:38 PM   CHOLHDL 2 12/16/2016 10:06 AM   LDLCALC 47 09/11/2019 03:38 PM    Wt Readings from Last 3 Encounters:  01/16/20 173 lb (78.5 kg)  01/15/20 178 lb 5.6 oz (80.9 kg)  01/08/20 162 lb (73.5 kg)     Exam:    BP (!) 166/84 (BP Location: Right Arm, Patient Position: Sitting, Cuff Size: Normal)   Pulse (!) 59   Ht 5\' 11"  (1.803 m)   Wt 173 lb (78.5 kg)   SpO2 94%   BMI 24.13 kg/m  Constitutional:  oriented to person, place, and time. No distress.  HENT:  Head: Grossly normal Eyes:  no discharge. No scleral icterus.  Neck: No JVD, no carotid bruits  Cardiovascular: Regular rate and rhythm, no murmurs appreciated Pulmonary/Chest: Clear to auscultation bilaterally, no wheezes or rails Abdominal: Soft.  no distension.  no tenderness.  Musculoskeletal: Normal range of motion Neurological:  normal muscle tone. Coordination normal. No atrophy Skin: Skin warm and dry Psychiatric: normal affect, pleasant  ASSESSMENT & PLAN:    Permanent atrial fibrillation (HCC) -  Permanent atrial fibrillation,  Declined cardioversion in the past as he was asymptomatic  severely dilated left atrium on prior echocardiogram Tolerating Eliquis 5 mg twice daily  Essential hypertension - Plan: EKG 12-Lead Blood pressure elevated today, no medication changes made, recommend he monitor blood pressure at home and call us with some numbers  CAD in native artery - Plan: EKG 12-Lead Currently with no symptoms of angina. No further workup at this time. Continue current medication regimen.  Stable  Acute on chronic diastolic CHF Discussed  pathology with him, Recommend he take Lasix 40 daily, extra 40 in the afternoon for weight over 168 pounds, Goal weight likely in the low 160 range He does have high fluid intake, coffee, watermelon etc.  Aneurysm of ascending aorta, s/post repair/grafting Echocardiogram with no significant change in aorta size 3.9 cm Seen on echo  Stable  Mixed hyperlipidemia Numbers at goal, no medication changes made  S/P AVR (aortic valve replacement) Stable on echocardiogram  Cognitive decline Family requesting referral to neurology    Total encounter time more than 25 minutes  Greater than 50% was spent in counseling and coordination of care with the patient    Disposition: Follow-up in 6 months   Signed, Ida Rogue, MD  01/16/2020 2:30 PM    [place] [facility] [address], [place], [place] [address]

## 2020-01-16 NOTE — Patient Instructions (Addendum)
Medication Instructions:  Please take lasix 40 mg twice a day for weight 168 or higher  Weight 167 or less,  Take lasix 40 mg once daily  Goal weight 164 to 165  If you need a refill on your cardiac medications before your next appointment, please call your pharmacy.    Lab work: No new labs needed   If you have labs (blood work) drawn today and your tests are completely normal, you will receive your results only by: Marland Kitchen MyChart Message (if you have MyChart) OR . A paper copy in the mail If you have any lab test that is abnormal or we need to change your treatment, we will call you to review the results.   Testing/Procedures: No new testing needed   Follow-Up: At [facility], you and your health needs are our priority.  As part of our continuing mission to provide you with exceptional heart care, we have created designated Provider Care Teams.  These Care Teams include your primary Cardiologist (physician) and Advanced Practice Providers (APPs -  Physician Assistants and Nurse Practitioners) who all work together to provide you with the care you need, when you need it.  . You will need a follow up appointment in 3 months  . Providers on your designated Care Team:   . Murray Hodgkins, NP . Christell Faith, PA-C . Marrianne Mood, PA-C  Any Other Special Instructions Will Be Listed Below (If Applicable).  COVID-19 Vaccine Information can be found at: ShippingScam.co.uk For questions related to vaccine distribution or appointments, please email [email or web address] [email or web address] or call [phone].

## 2020-01-16 NOTE — Telephone Encounter (Signed)
Please let the patient know that I received a message from his cardiologist regarding a request for a neurology referral.  I went ahead and placed this for him.

## 2020-01-18 ENCOUNTER — Other Ambulatory Visit: Payer: Self-pay | Admitting: Cardiovascular Disease

## 2020-01-18 ENCOUNTER — Other Ambulatory Visit: Payer: Self-pay | Admitting: Family Medicine

## 2020-01-18 DIAGNOSIS — I4819 Other persistent atrial fibrillation: Secondary | ICD-10-CM

## 2020-01-18 DIAGNOSIS — F4381 Prolonged grief disorder: Secondary | ICD-10-CM

## 2020-01-18 DIAGNOSIS — F4329 Adjustment disorder with other symptoms: Secondary | ICD-10-CM

## 2020-01-18 DIAGNOSIS — F419 Anxiety disorder, unspecified: Secondary | ICD-10-CM

## 2020-01-18 NOTE — Telephone Encounter (Signed)
Refill request

## 2020-01-18 NOTE — Telephone Encounter (Signed)
Prescription refill request for Eliquis received. Indication: A Fib Last office visit: 01/16/20 Scr: 1.07 Age: 79 Weight: 78.5kg

## 2020-01-21 DIAGNOSIS — I509 Heart failure, unspecified: Secondary | ICD-10-CM | POA: Insufficient documentation

## 2020-01-22 ENCOUNTER — Inpatient Hospital Stay: Payer: Medicare Other

## 2020-01-22 ENCOUNTER — Other Ambulatory Visit: Payer: Self-pay

## 2020-01-22 VITALS — BP 134/64 | HR 59 | Temp 97.1°F | Resp 18

## 2020-01-22 DIAGNOSIS — Z87891 Personal history of nicotine dependence: Secondary | ICD-10-CM | POA: Diagnosis not present

## 2020-01-22 DIAGNOSIS — Z79899 Other long term (current) drug therapy: Secondary | ICD-10-CM | POA: Diagnosis not present

## 2020-01-22 DIAGNOSIS — D509 Iron deficiency anemia, unspecified: Secondary | ICD-10-CM | POA: Diagnosis not present

## 2020-01-22 DIAGNOSIS — G609 Hereditary and idiopathic neuropathy, unspecified: Secondary | ICD-10-CM | POA: Diagnosis not present

## 2020-01-22 DIAGNOSIS — M069 Rheumatoid arthritis, unspecified: Secondary | ICD-10-CM | POA: Diagnosis not present

## 2020-01-22 DIAGNOSIS — I5021 Acute systolic (congestive) heart failure: Secondary | ICD-10-CM | POA: Diagnosis not present

## 2020-01-22 DIAGNOSIS — J9601 Acute respiratory failure with hypoxia: Secondary | ICD-10-CM | POA: Diagnosis not present

## 2020-01-22 DIAGNOSIS — D649 Anemia, unspecified: Secondary | ICD-10-CM | POA: Diagnosis not present

## 2020-01-22 MED ORDER — IRON SUCROSE 20 MG/ML IV SOLN
200.0000 mg | Freq: Once | INTRAVENOUS | Status: AC
  Administered 2020-01-22: 200 mg via INTRAVENOUS
  Filled 2020-01-22: qty 10

## 2020-01-22 MED ORDER — SODIUM CHLORIDE 0.9 % IV SOLN
Freq: Once | INTRAVENOUS | Status: AC
  Filled 2020-01-22: qty 250

## 2020-01-29 ENCOUNTER — Other Ambulatory Visit: Payer: Self-pay

## 2020-01-29 ENCOUNTER — Inpatient Hospital Stay: Payer: Medicare Other | Attending: Hematology and Oncology

## 2020-01-29 VITALS — BP 141/59 | HR 50 | Temp 97.2°F | Resp 18

## 2020-01-29 DIAGNOSIS — Z87891 Personal history of nicotine dependence: Secondary | ICD-10-CM | POA: Diagnosis not present

## 2020-01-29 DIAGNOSIS — M1990 Unspecified osteoarthritis, unspecified site: Secondary | ICD-10-CM | POA: Insufficient documentation

## 2020-01-29 DIAGNOSIS — Z885 Allergy status to narcotic agent status: Secondary | ICD-10-CM | POA: Diagnosis not present

## 2020-01-29 DIAGNOSIS — D649 Anemia, unspecified: Secondary | ICD-10-CM | POA: Diagnosis not present

## 2020-01-29 DIAGNOSIS — K31819 Angiodysplasia of stomach and duodenum without bleeding: Secondary | ICD-10-CM | POA: Diagnosis not present

## 2020-01-29 DIAGNOSIS — R718 Other abnormality of red blood cells: Secondary | ICD-10-CM | POA: Diagnosis not present

## 2020-01-29 DIAGNOSIS — I480 Paroxysmal atrial fibrillation: Secondary | ICD-10-CM | POA: Insufficient documentation

## 2020-01-29 DIAGNOSIS — Z7901 Long term (current) use of anticoagulants: Secondary | ICD-10-CM | POA: Insufficient documentation

## 2020-01-29 DIAGNOSIS — D61818 Other pancytopenia: Secondary | ICD-10-CM | POA: Diagnosis not present

## 2020-01-29 DIAGNOSIS — Z8262 Family history of osteoporosis: Secondary | ICD-10-CM | POA: Insufficient documentation

## 2020-01-29 DIAGNOSIS — D696 Thrombocytopenia, unspecified: Secondary | ICD-10-CM | POA: Diagnosis not present

## 2020-01-29 DIAGNOSIS — K2950 Unspecified chronic gastritis without bleeding: Secondary | ICD-10-CM | POA: Insufficient documentation

## 2020-01-29 DIAGNOSIS — K5730 Diverticulosis of large intestine without perforation or abscess without bleeding: Secondary | ICD-10-CM | POA: Insufficient documentation

## 2020-01-29 DIAGNOSIS — G609 Hereditary and idiopathic neuropathy, unspecified: Secondary | ICD-10-CM | POA: Diagnosis not present

## 2020-01-29 DIAGNOSIS — D509 Iron deficiency anemia, unspecified: Secondary | ICD-10-CM

## 2020-01-29 DIAGNOSIS — I509 Heart failure, unspecified: Secondary | ICD-10-CM | POA: Insufficient documentation

## 2020-01-29 DIAGNOSIS — I2510 Atherosclerotic heart disease of native coronary artery without angina pectoris: Secondary | ICD-10-CM | POA: Diagnosis not present

## 2020-01-29 DIAGNOSIS — R042 Hemoptysis: Secondary | ICD-10-CM | POA: Diagnosis not present

## 2020-01-29 DIAGNOSIS — E785 Hyperlipidemia, unspecified: Secondary | ICD-10-CM | POA: Insufficient documentation

## 2020-01-29 DIAGNOSIS — J9601 Acute respiratory failure with hypoxia: Secondary | ICD-10-CM | POA: Diagnosis not present

## 2020-01-29 DIAGNOSIS — Z880 Allergy status to penicillin: Secondary | ICD-10-CM | POA: Diagnosis not present

## 2020-01-29 DIAGNOSIS — I110 Hypertensive heart disease with heart failure: Secondary | ICD-10-CM | POA: Insufficient documentation

## 2020-01-29 DIAGNOSIS — D120 Benign neoplasm of cecum: Secondary | ICD-10-CM | POA: Diagnosis not present

## 2020-01-29 DIAGNOSIS — Z8249 Family history of ischemic heart disease and other diseases of the circulatory system: Secondary | ICD-10-CM | POA: Diagnosis not present

## 2020-01-29 DIAGNOSIS — Z9049 Acquired absence of other specified parts of digestive tract: Secondary | ICD-10-CM | POA: Diagnosis not present

## 2020-01-29 DIAGNOSIS — Z79899 Other long term (current) drug therapy: Secondary | ICD-10-CM | POA: Insufficient documentation

## 2020-01-29 DIAGNOSIS — Z836 Family history of other diseases of the respiratory system: Secondary | ICD-10-CM | POA: Insufficient documentation

## 2020-01-29 DIAGNOSIS — I5021 Acute systolic (congestive) heart failure: Secondary | ICD-10-CM | POA: Diagnosis not present

## 2020-01-29 MED ORDER — SODIUM CHLORIDE 0.9 % IV SOLN
Freq: Once | INTRAVENOUS | Status: AC
  Filled 2020-01-29: qty 250

## 2020-01-29 MED ORDER — IRON SUCROSE 20 MG/ML IV SOLN
200.0000 mg | Freq: Once | INTRAVENOUS | Status: AC
  Administered 2020-01-29: 200 mg via INTRAVENOUS
  Filled 2020-01-29: qty 10

## 2020-01-30 ENCOUNTER — Telehealth: Payer: Self-pay | Admitting: Family Medicine

## 2020-01-30 DIAGNOSIS — M546 Pain in thoracic spine: Secondary | ICD-10-CM | POA: Diagnosis not present

## 2020-01-30 DIAGNOSIS — M5440 Lumbago with sciatica, unspecified side: Secondary | ICD-10-CM | POA: Diagnosis not present

## 2020-01-30 NOTE — Telephone Encounter (Signed)
Left vm for Bryan [name] from [place] home to return call.

## 2020-02-11 NOTE — Progress Notes (Signed)
[facility]  [address] [employer], [place] [address] Phone: [phone]  Fax: [phone]   Clinic Day: 02/12/2020   Referring physician: [name], MD  Chief Complaint: Bryan [name] is a 79 y.o. male with arthritis, iron deficiency anemia, and mild pancytopenia who is seen for 7 week assessment.  HPI:  The patient was last seen in the hematology clinic on 01/15/2020. At that time, he had been "ok".  He had been short of breath with minimal exertion for 3 days.  Weight was up 16 pounds in 7 days.  He denied any bleeding.  Exam revealed dullness at the right lung base and bilateral crackles 1/4 of the way up. Hematocrit was 31.7, hemoglobin 10.3, MCV 95.8, platelets 116,000, WBC 3600, ANC 2500.  Ferritin was 125, iron saturation 14% and TIBC 398. Sed rate was 17.   He received Venofer on 01/22/2020 and 01/29/2020.   During the interim, he is feeling ok. He states that he is breathing ok. He is urinating a lot on Lasix, however.   He has a bandaid covering a place on his forehead which he says is a wound that doesn't want to heal up. He has an appointment with dermatology on 04/23/2020, and could not get in sooner.   He has an appointment with endocrinology on 04/28/2020. He notes that he has been out of folic acid for one week now and needs more; he takes 1mg  PO daily.   He notes that his pulse is usually in the 50s.    Past Medical History:  Diagnosis Date  . Abnormal CT scan    Asymmetric left rectal wall thickening   . Anemia   . Anxiety   . Arrhythmia   . CHF (congestive heart failure) ([place])   . Chronic lower back pain   . Complication of anesthesia    Memory loss 09/2015  . Coronary artery disease   . Depression   . GERD (gastroesophageal reflux disease)   . Glaucoma   . H/O thoracic aortic aneurysm repair   . Headache   . Heart murmur   . History of being hospitalized    memory lose kidney funtion down blood  pressure up  . History of blood transfusion    "think he had one when he had heart valve OR" (10/14/2016); "none since" (10/19/2017)  . History of chicken pox   . Hyperlipidemia   . Hypertension   . Lumbar stenosis   . Murmur   . Neuropathy   . Osteoarthritis    worse in feet and ankles  . Paroxysmal A-fib ([place])   . Poor short term memory    takes Aricept  . Rheumatoid arthritis ([place])    "all over" (10/14/2016)  . Schizophrenia ([place])   . Seasonal allergies   . Thoracic spine pain   . Valvular heart disease   . Wears glasses    reading    Past Surgical History:  Procedure Laterality Date  . AORTIC VALVE REPLACEMENT  2007   [facility].  [place], [place]; "pig valve"  . APPENDECTOMY  2010  . BACK SURGERY    . CARDIAC VALVE REPLACEMENT    . CATARACT EXTRACTION W/ INTRAOCULAR LENS  IMPLANT, BILATERAL Bilateral 2012  . COLONOSCOPY WITH PROPOFOL N/A 09/24/2016   Procedure: COLONOSCOPY WITH PROPOFOL;  Surgeon: Jonathon Bellows, MD;  Location: [facility] ENDOSCOPY;  Service: Endoscopy;  Laterality: N/A;  . ESOPHAGOGASTRODUODENOSCOPY (EGD) WITH PROPOFOL N/A 09/24/2016   Procedure: ESOPHAGOGASTRODUODENOSCOPY (EGD)  WITH PROPOFOL;  Surgeon: Jonathon Bellows, MD;  Location: [facility] ENDOSCOPY;  Service: Endoscopy;  Laterality: N/A;  . GIVENS CAPSULE STUDY N/A 09/24/2016   Procedure: GIVENS CAPSULE STUDY;  Surgeon: Jonathon Bellows, MD;  Location: [facility] ENDOSCOPY;  Service: Endoscopy;  Laterality: N/A;  . HAMMER TOE SURGERY Right 10/09/2015   Procedure: HAMMER TOE REPAIR WITH K-WIRE FIXATION RIGHT SECOND TOE;  Surgeon: Albertine Patricia, DPM;  Location: [place];  Service: Podiatry;  Laterality: Right;  WITH LOCAL  . HARDWARE REMOVAL N/A 11/02/2019   Procedure: Removal of hardware thoracic spine;  Surgeon: Kary Kos, MD;  Location: [place];  Service: Neurosurgery;  Laterality: N/A;  posterior  . INGUINAL HERNIA REPAIR Right 05/05/2018   Medium Bard PerFix plug.  Surgeon: Robert Bellow, MD;  Location: [facility] ORS;   Service: General;  Laterality: Right;  . IR VERTEBROPLASTY CERV/THOR BX INC UNI/BIL INC/INJECT/IMAGING  10/27/2018  . JOINT REPLACEMENT Left    left total knee  . LAMINECTOMY WITH POSTERIOR LATERAL ARTHRODESIS LEVEL 3 N/A 09/28/2017   Procedure: LUMBAR  POSTEROR  FUSION REVISION - LUMBAR ONE -TWO, LUMBAR TWO -THREE,THREE-FOUR ,BILATERAL ARTHRODESIS REMOVAL LUMBAR THREE HARDWARE;  Surgeon: Kary Kos, MD;  Location: [place];  Service: Neurosurgery;  Laterality: N/A;  . LAMINECTOMY WITH POSTERIOR LATERAL ARTHRODESIS LEVEL 3 N/A 10/20/2017   Procedure: Revision fusion and removal of hardware Lumbar one, Pedicle screw fixation thoracic ten-lumbar two, thoracic nine-ten laminotomy, Posterior Lumbar Arthrodesis thoracic ten-lumbar two ;  Surgeon: Kary Kos, MD;  Location: [place];  Service: Neurosurgery;  Laterality: N/A;  . LAPAROSCOPIC CHOLECYSTECTOMY  2010  . LUMBAR FUSION Right 06/08/2017   LUMBAR THREE-FOUR, LUMBAR FOUR-FIVE POSTEROLATERAL ARTHRODESIS WITH RIGHT LUMBAR FOUR-FIVE LAMINECTOMY/FORAMINOTOMY  . LUMBAR LAMINECTOMY/DECOMPRESSION MICRODISCECTOMY Right 03/08/2016   Procedure: Laminectomy and Foraminotomy - Lumbar Five-Sacral One Right;  Surgeon: Kary Kos, MD;  Location: [place];  Service: Neurosurgery;  Laterality: Right;  Right  . MULTIPLE TOOTH EXTRACTIONS     "3"  . POSTERIOR LUMBAR FUSION  10/14/2016   L5-S1  . REPLACEMENT TOTAL KNEE Left 2003  . THORACIC AORTIC ANEURYSM REPAIR  2010    Family History  Problem Relation Age of Onset  . Heart failure Mother   . Hypertension Mother   . Asthma Mother   . Osteoporosis Mother   . Heart attack Father 72       MI  . Hypertension Sister   . Prostate cancer Neg Hx   . Bladder Cancer Neg Hx   . Kidney cancer Neg Hx   . Colon cancer Neg Hx     Social History:  reports that he quit smoking about 38 years ago. His smoking use included cigarettes. He has a 32.00 pack-year smoking history. He has never used smokeless tobacco. He reports current  alcohol use. He reports that he does not use drugs. He smoked 1 pack a day x 12 years. He stopped smoking 30 years ago. He drinks a glass of wine occasionally. He is a Software engineer. [place] His wife passed away in Aug 02, 2018. The patient is alone today.   Allergies:  Allergies  Allergen Reactions  . Penicillins Hives, Swelling and Other (See Comments)    SWELLING REACTION UNSPECIFIED PATIENT HAS TAKEN AMOXICILLIN ON MED HX FROM [facility] PATIENT HAS HAD A PCN REACTION WITH IMMEDIATE RASH, FACIAL/TONGUE/THROAT SWELLING, SOB, OR LIGHTHEADEDNESS WITH HYPOTENSION:  #  #  #  YES  #  #  #   Has  patient had a PCN reaction causing severe rash involving mucus membranes or skin necrosis: No Has patient had a PCN reaction that required hospitalization No Has patient had a PCN reaction occurring within the last 10 years: No  . Demerol [Meperidine] Hives and Nausea And Vomiting    Current Medications: Current Outpatient Medications  Medication Sig Dispense Refill  . aspirin EC 81 MG tablet Take 1 tablet (81 mg total) by mouth daily. (Patient taking differently: Take 81 mg by mouth daily with supper. )    . atorvastatin (LIPITOR) 40 MG tablet TAKE ONE TABLET BY MOUTH EVERY DAY (Patient taking differently: Take 40 mg by mouth at bedtime. ) 90 tablet 3  . calcium carbonate (OS-CAL - DOSED IN MG OF ELEMENTAL CALCIUM) 1250 (500 Ca) MG tablet Take 1 tablet by mouth daily with lunch.    . Carboxymethylcellul-Glycerin (LUBRICATING EYE DROPS OP) Place 1 drop into both eyes daily as needed (dry eyes).    . cetirizine (ZYRTEC) 10 MG tablet Take 10 mg by mouth daily.    . diphenhydrAMINE (BENADRYL) 25 MG tablet Take 25-50 mg by mouth at bedtime as needed for sleep.    Marland Kitchen docusate sodium (COLACE) 100 MG capsule Take 200 mg by mouth at bedtime.    Marland Kitchen ELIQUIS 5 MG TABS tablet TAKE 1 TABLET BY MOUTH TWICE DAILY 180 tablet 2  . fenofibrate 160 MG tablet TAKE ONE TABLET AT  BEDTIME 90 tablet 0  . FLUoxetine (PROZAC) 20 MG capsule TAKE 1 CAPSULE BY MOUTH ONCE DAILY 90 capsule 1  . furosemide (LASIX) 40 MG tablet Take 1 tablet (40 mg total) by mouth as directed. Take twice a day for weight 165 or higher. Weight less than 165 take Once daily 180 tablet 3  . gabapentin (NEURONTIN) 300 MG capsule Take 1 capsule (300 mg total) by mouth 3 (three) times daily. 270 capsule 1  . HYDROcodone-acetaminophen (NORCO) 10-325 MG tablet Take 1 tablet by mouth 4 (four) times daily as needed for moderate pain.     . hydrocortisone cream 1 % Apply 1 application topically daily as needed for itching.    . hydroxychloroquine (PLAQUENIL) 200 MG tablet Take 200 mg by mouth 3 (three) times a week. Tue, Thurs, and Sat    . lisinopril (ZESTRIL) 20 MG tablet TAKE 1 TABLET BY MOUTH EVERY EVENING 90 tablet 0  . metoprolol tartrate (LOPRESSOR) 25 MG tablet Take 0.5 tablets (12.5 mg total) by mouth 2 (two) times daily. 60 tablet 1  . Multiple Vitamins-Minerals (PRESERVISION AREDS 2) CAPS Take 1 capsule by mouth 2 (two) times daily.    Marland Kitchen testosterone cypionate (DEPOTESTOSTERONE CYPIONATE) 200 MG/ML injection Inject 0.25 cc every 2 weeks (Patient taking differently: Inject 100 mg into the muscle every 14 (fourteen) days. ) 10 mL 0  . vitamin B-12 (CYANOCOBALAMIN) 1000 MCG tablet Take 1,000 mcg by mouth daily with lunch.     . vitamin E 180 MG (400 UNITS) capsule Take 800 Units by mouth daily with lunch.    . folic acid (FOLVITE) 1 MG tablet Take 1 mg by mouth in the morning and at bedtime.      No current facility-administered medications for this visit.   Facility-Administered Medications Ordered in Other Visits  Medication Dose Route Frequency Provider Last Rate Last Admin  . 0.9 %  sodium chloride infusion   Intravenous Continuous Lequita Asal, MD 10 mL/hr at 12/11/19 1123 New Bag at 12/11/19 1123    Review of Systems  Constitutional:  Negative.  Negative for chills, diaphoresis, fever,  malaise/fatigue and weight loss (stable).  HENT: Negative.  Negative for congestion, ear pain, hearing loss, nosebleeds, sinus pain and sore throat.   Eyes: Negative.  Negative for blurred vision, double vision and photophobia.  Respiratory: Negative for cough, hemoptysis, sputum production, shortness of breath and wheezing.        Breathing "pretty good".  Cardiovascular: Negative.  Negative for chest pain, palpitations, orthopnea, leg swelling and PND.  Gastrointestinal: Negative.  Negative for abdominal pain, blood in stool, constipation, diarrhea, heartburn, melena, nausea and vomiting.       Eating well.  Genitourinary: Negative.  Negative for dysuria, frequency, hematuria and urgency.  Musculoskeletal: Positive for joint pain (rheumatoid arthritis). Negative for back pain, falls, myalgias and neck pain.  Skin: Negative for rash.       Wound on forehead that won't heal.  Neurological: Positive for sensory change (neuropathy, on Neurontin). Negative for dizziness, tingling, weakness and headaches.  Endo/Heme/Allergies: Negative.  Does not bruise/bleed easily.       Testosterone q 2 weeks.  Psychiatric/Behavioral: Positive for depression (lost his wife in 06/2018). Negative for memory loss and substance abuse. The patient is not nervous/anxious and does not have insomnia.   All other systems reviewed and are negative.  Performance status (ECOG):  1  Vitals Blood pressure 139/61, pulse (!) 48, temperature (!) 97 F (36.1 C), temperature source Tympanic, resp. rate 18, weight 178 lb 9.2 oz (81 kg), SpO2 96 %.   Physical Exam Vitals and nursing note reviewed.  Constitutional:      General: He is not in acute distress.    Appearance: He is well-developed. He is not diaphoretic.     Comments: He has a cane by his side.  HENT:     Head: Normocephalic and atraumatic.     Comments: White hair and beard.  Forehead band-aid.    Mouth/Throat:     Mouth: Mucous membranes are moist. Injury  (bit inner R cheek) present.     Pharynx: Oropharynx is clear. No oropharyngeal exudate.  Eyes:     General: No scleral icterus.    Extraocular Movements: Extraocular movements intact.     Conjunctiva/sclera: Conjunctivae normal.     Pupils: Pupils are equal, round, and reactive to light.     Comments: Glasses.  Hazel eyes.  Neck:     Comments: Prominent neck veins. Cardiovascular:     Rate and Rhythm: Normal rate and regular rhythm.     Heart sounds: Normal heart sounds. No murmur heard.   Pulmonary:     Effort: No respiratory distress.     Breath sounds: No wheezing or rales.  Abdominal:     General: Bowel sounds are normal. There is no distension.     Palpations: Abdomen is soft. There is no mass.     Tenderness: There is no abdominal tenderness. There is no guarding or rebound.  Musculoskeletal:        General: No swelling or tenderness. Normal range of motion.     Cervical back: Normal range of motion.     Right lower leg: No edema.     Left lower leg: No edema.  Lymphadenopathy:     Head:     Right side of head: No preauricular, posterior auricular or occipital adenopathy.     Left side of head: No preauricular, posterior auricular or occipital adenopathy.     Cervical: No cervical adenopathy.     Upper Body:  Right upper body: No supraclavicular or axillary adenopathy.     Left upper body: No supraclavicular or axillary adenopathy.     Lower Body: No right inguinal adenopathy. No left inguinal adenopathy.  Skin:    General: Skin is warm and dry.     Coloration: Skin is not pale.     Findings: No erythema or rash.  Neurological:     Mental Status: He is alert and oriented to person, place, and time. Mental status is at baseline.  Psychiatric:        Mood and Affect: Mood normal.        Behavior: Behavior normal.        Thought Content: Thought content normal.        Judgment: Judgment normal.    Appointment on 02/12/2020  Component Date Value Ref Range Status   . Retic Ct Pct 02/12/2020 2.1  0.4 - 3.1 % Final  . RBC. 02/12/2020 3.35* 4.22 - 5.81 MIL/uL Final  . Retic Count, Absolute 02/12/2020 69.7  19.0 - 186.0 K/uL Final  . Immature Retic Fract 02/12/2020 13.9  2.3 - 15.9 % Final   Performed at [facility], [address] [place], [place] [address]  . CRP 02/12/2020 0.6  <1.0 mg/dL Final   Performed at [facility] [address], [place], [place] [address]  . Ferritin 02/12/2020 205  24 - 336 ng/mL Final   Performed at [facility], [address] [place], [place] [address]  . WBC 02/12/2020 3.7* 4.0 - 10.5 K/uL Final  . RBC 02/12/2020 3.51* 4.22 - 5.81 MIL/uL Final  . Hemoglobin 02/12/2020 11.0* 13.0 - 17.0 g/dL Final  . HCT 02/12/2020 33.7* 39 - 52 % Final  . MCV 02/12/2020 96.0  80.0 - 100.0 fL Final  . MCH 02/12/2020 31.3  26.0 - 34.0 pg Final  . MCHC 02/12/2020 32.6  30.0 - 36.0 g/dL Final  . RDW 02/12/2020 15.8* 11.5 - 15.5 % Final  . Platelets 02/12/2020 109* 150 - 400 K/uL Final   Comment: Immature Platelet Fraction may be clinically indicated, consider ordering this additional test [id number]   . nRBC 02/12/2020 0.0  0.0 - 0.2 % Final  . Neutrophils Relative % 02/12/2020 58  % Final  . Neutro Abs 02/12/2020 2.2  1.7 - 7.7 K/uL Final  . Lymphocytes Relative 02/12/2020 23  % Final  . Lymphs Abs 02/12/2020 0.9  0.7 - 4.0 K/uL Final  . Monocytes Relative 02/12/2020 14  % Final  . Monocytes Absolute 02/12/2020 0.5  0 - 1 K/uL Final  . Eosinophils Relative 02/12/2020 4  % Final  . Eosinophils Absolute 02/12/2020 0.1  0 - 0 K/uL Final  . Basophils Relative 02/12/2020 1  % Final  . Basophils Absolute 02/12/2020 0.0  0 - 0 K/uL Final  . Immature Granulocytes 02/12/2020 0  % Final  . Abs Immature Granulocytes 02/12/2020 0.01  0.00 - 0.07 K/uL Final   Performed at [facility], [address] [place], [place] [address]    Assessment:  Bryan [name] is a 79 y.o. male withrheumatoid  arthritisand progressive anemiaover the past 2 years. He has been on Plaquenil and hydralazinewhich can cause anemia. Plaquenil was discontinued on 08/26/2016. He denies any exposure to radiation or toxins. He denies any prior history of hepatitis, prior transfusions or HIV risk factors. He denies any herbal products.  Diet is good. EGDon 09/24/2016 revealed patchy candidiasis in the entire esophagus. There were  two non-bleeding angioectasias in the duodenum treated with argon plasma coagulation. Gastritis was biopsied. Pathology revealed mild chronic gastritis negative for H pylori, dysplasia and malignancy. Colonoscopyon 09/24/2016 revealed diverticulosis in the entire colon, one 3 mm polyp in the cecum, and non-bleeding internal hemorrhoids. Pathology from the cecal polyp revealed a tubular adenoma negative for high grade dysplasia and malignancy.   He denies any melena or hematochezia. He denies any hematuria.  CBC on 01/10/2018revealed a hematocrit of 29.5, hemoglobin 10.0, MCV 92.3, platelets 155,000, and WBC 4200. Stool was guaiac negative x 2 in 06/2016. Labs on 01/12/2018revealed a ferritin of 124, iron saturation 22% and TIBC of 370. B12 was 883 on 06/18/2014.  Work-up on 02/26/2018revealed a hematocrit of 31.7, hemoglobin 10.6, MCV 91, platelets 143,000, white count 3000 with an ANC of 1800. Absolute lymphocyte count was 700 (low). Creatinine was 1.57. LDHwas 269 (98-192). Normal labsincluded: uric acid, folate, [place], SPEP, and copper. Iron studiesincluded a saturation of 11% (low) and a TIBC of 454 (high) c/wiron deficiency anemia. Sed rate was 19. Retic was 0.9% (low). He is on oral ironwith vitamin C.  Chest, abdomen, and pelvic CTon 08/09/2016 revealed no acute findings or clear evidence of malignancy in the chest, abdomen or pelvis. There was asymmetric left rectal wall thickeningpossibly secondary to volume averaging with the prostate  gland.  Bone marrow aspirate and biopsyon 11/04/2016 revealed a normocellular marrow (20%) with trilineage hematopoiesis and maturation. There was mild megakaryocytic atypia. Storage iron was present. There were rare ringed sideroblasts. Cytogeneticsand FISHwere normal (84, XY).  He was admitted to [facility] 10/14/2016 - 10/18/2016 for spinal stenosis of the lumbar region. He underwent redo decompressive lumbar laminotomy L5-S1 with radical foraminotomy the L5 and S1 nerve root with complete medial facetectomies. He underwentback surgery(revision fusion removal hardware and pedicle screw fixation) on 10/20/2017.  He was admitted to [facility] 10/28/2017 - 10/31/2017 with symptomatic anemia. He received 2 units of PRBCs. Hemoglobin improved from 6.2 to 8.1.  Hospital work-up revealed the following normal labs:ferritin (65), B12 (401), TSH, SPEP, haptoglobin. Testosteronewas <3 (low). Iron saturation was 7% with a TIBC of 317. LDH was 205 (98-192). Retic was 2.6%. Creatinine was 1.37 (CrCl 47.3 ml/min).  Peripheral smear revealedleukopenia with normal WBC morphology. Normocytic anemia with polychromasia, anisocytosis and rouleaux formation.   Work-up on 11/08/2017 revealed a hematocrit of 27.2, hemoglobin 9.0, platelets 200,000, WBC 5800 with an [place] of 4100. Ferritin was 86. Iron saturation was 6% with a TIBC of 382. Sed rate was 56 (0-20). Cold agglutinins were negative. Reticulocyte count was 1.2%. SPEP revealed no monoclonal protein on 10/31/2017.  He received weekly Venoferx 2 (11/25/2017 - 12/02/2017), x 2 (12/23/2017 - 12/30/2017), and x 3 (12/11/2019 - 12/25/2019), and x 2 (01/22/2020 - 01/29/2020).  Ferritinhas been followed: 106 on 06/24/2017, 65 on 10/30/2017, 86 on 11/08/2017, 124 on 12/12/2017, 106 on 12/15/2017, 95 on 01/20/2018, 133 on 03/10/2018, 90 on 12/19/2018, 138 on 06/20/2019, 180 on 11/22/2019, 106 on 12/05/2019 and 125 on 01/14/2020. Iron  saturationwas 6% on 06/24/2017, 6% on 01/20/2018, 11% on 12/05/2019 and 14% on 01/14/2020. Sed rate was 56 on 11/08/2017, 64 on 12/05/2017, and 40 on 11/22/2019.  He has a neuropathic ulcerof the right great toe. He has treated with Septra then switched to doxycycline. He underwent back surgeryon 10/20/2017.  The patient was admitted to [facility] from 11/12/2019-11/14/2019 for hemoptysis, pulmonary edema, and acute hypoxemic respiratory failure. CXR revealed symmetric bilateral airspace disease be secondary to pulmonary edema or pneumonia.   The  patient received the [place] COVID-19 vaccines in 05/2019.  Symptomatically, he feels "ok".  He is breathing better.  He is voiding well on Lasix.  Exam is back to baseline.  Plan: 1.   Labs today: CBC with diff, ferritin, CRP. 2.  Iron deficiency anemia Hematocrit 39.7.  Hemoglobin 12.9. MCV   97.3 on 06/20/2019.  Hematocrit 35.1.  Hemoglobin 11.1.  MCV 100.3 on 11/02/2019.  Hematocrit 28.9.  Hemoglobin   9.6.  MCV   93.5 on 11/22/2019.  Hematocrit 31.7.  Hemoglobin 10.3.  MCV   95.8 on 01/14/2020.  Hematocrit 33.7.  Hemoglobin 11.0.  MCV   96.0 on 02/12/2020.              Ferritin 205 (available after clinic). Retic 3.0% (relatively low) and LDH 306 on 11/22/2019.   B12 was 482 and folate 76 on 12/05/2019.     TSH was 1.476 on 12/05/2019.  Patient previously declined bone marrow aspirate and biopsy.    Continue to monitor his CBC is improving. 3. Thrombocytopenia Platelet count 144,000 on 06/20/2019.  Platelet count 271,000 on 11/22/2019.  Platelet count 116,000 on 01/14/2020.  Platelet count 109,000 on 02/12/2020. He is on no new medications or herbal products.               He remains on Plaquenil             Continue to monitor. 4.   RN to call patient with today's labs and if IV iron is needed. 6.   RTC in 6 weeks for labs (CBC with diff). 7.   RTC in 3 months for MD assessment, labs  (CBC with diff, CMP, ferritin, iron studies).   I discussed the assessment and treatment plan with the patient.  The patient was provided an opportunity to ask questions and all were answered.  The patient agreed with the plan and demonstrated an understanding of the instructions.  The patient was advised to call back if the symptoms worsen or if the condition fails to improve as anticipated.   Lequita Asal, MD, PhD    02/12/2020, .4:57 PM   I, Jacqualyn Posey, am acting as a Education administrator for [employer]. [name], MD.   I, Caige Almeda C. [name], MD, have reviewed the above documentation for accuracy and completeness, and I agree with the above.

## 2020-02-12 ENCOUNTER — Other Ambulatory Visit: Payer: Self-pay

## 2020-02-12 ENCOUNTER — Inpatient Hospital Stay: Payer: Medicare Other

## 2020-02-12 ENCOUNTER — Inpatient Hospital Stay (HOSPITAL_BASED_OUTPATIENT_CLINIC_OR_DEPARTMENT_OTHER): Payer: Medicare Other | Admitting: Hematology and Oncology

## 2020-02-12 ENCOUNTER — Encounter: Payer: Self-pay | Admitting: Hematology and Oncology

## 2020-02-12 VITALS — BP 139/61 | HR 48 | Temp 97.0°F | Resp 18 | Wt 178.6 lb

## 2020-02-12 DIAGNOSIS — I6523 Occlusion and stenosis of bilateral carotid arteries: Secondary | ICD-10-CM | POA: Diagnosis not present

## 2020-02-12 DIAGNOSIS — M1990 Unspecified osteoarthritis, unspecified site: Secondary | ICD-10-CM | POA: Diagnosis not present

## 2020-02-12 DIAGNOSIS — D509 Iron deficiency anemia, unspecified: Secondary | ICD-10-CM

## 2020-02-12 DIAGNOSIS — D696 Thrombocytopenia, unspecified: Secondary | ICD-10-CM | POA: Diagnosis not present

## 2020-02-12 DIAGNOSIS — D61818 Other pancytopenia: Secondary | ICD-10-CM | POA: Diagnosis not present

## 2020-02-12 DIAGNOSIS — I480 Paroxysmal atrial fibrillation: Secondary | ICD-10-CM | POA: Diagnosis not present

## 2020-02-12 DIAGNOSIS — E785 Hyperlipidemia, unspecified: Secondary | ICD-10-CM | POA: Diagnosis not present

## 2020-02-12 LAB — CBC WITH DIFFERENTIAL/PLATELET
Abs Immature Granulocytes: 0.01 K/uL (ref 0.00–0.07)
Basophils Absolute: 0 K/uL (ref 0.0–0.1)
Basophils Relative: 1 %
Eosinophils Absolute: 0.1 K/uL (ref 0.0–0.5)
Eosinophils Relative: 4 %
HCT: 33.7 % — ABNORMAL LOW (ref 39.0–52.0)
Hemoglobin: 11 g/dL — ABNORMAL LOW (ref 13.0–17.0)
Immature Granulocytes: 0 %
Lymphocytes Relative: 23 %
Lymphs Abs: 0.9 K/uL (ref 0.7–4.0)
MCH: 31.3 pg (ref 26.0–34.0)
MCHC: 32.6 g/dL (ref 30.0–36.0)
MCV: 96 fL (ref 80.0–100.0)
Monocytes Absolute: 0.5 K/uL (ref 0.1–1.0)
Monocytes Relative: 14 %
Neutro Abs: 2.2 K/uL (ref 1.7–7.7)
Neutrophils Relative %: 58 %
Platelets: 109 K/uL — ABNORMAL LOW (ref 150–400)
RBC: 3.51 MIL/uL — ABNORMAL LOW (ref 4.22–5.81)
RDW: 15.8 % — ABNORMAL HIGH (ref 11.5–15.5)
WBC: 3.7 K/uL — ABNORMAL LOW (ref 4.0–10.5)
nRBC: 0 % (ref 0.0–0.2)

## 2020-02-12 LAB — RETICULOCYTES
Immature Retic Fract: 13.9 % (ref 2.3–15.9)
RBC.: 3.35 MIL/uL — ABNORMAL LOW (ref 4.22–5.81)
Retic Count, Absolute: 69.7 K/uL (ref 19.0–186.0)
Retic Ct Pct: 2.1 % (ref 0.4–3.1)

## 2020-02-12 LAB — C-REACTIVE PROTEIN: CRP: 0.6 mg/dL (ref <1.0)

## 2020-02-12 LAB — FERRITIN: Ferritin: 205 ng/mL (ref 24–336)

## 2020-02-12 NOTE — Progress Notes (Signed)
Neuropathy in feet is causing pain

## 2020-02-15 DIAGNOSIS — Z23 Encounter for immunization: Secondary | ICD-10-CM | POA: Diagnosis not present

## 2020-02-22 ENCOUNTER — Ambulatory Visit (INDEPENDENT_AMBULATORY_CARE_PROVIDER_SITE_OTHER): Payer: Medicare Other | Admitting: Family Medicine

## 2020-02-22 ENCOUNTER — Encounter: Payer: Self-pay | Admitting: Family Medicine

## 2020-02-22 ENCOUNTER — Other Ambulatory Visit: Payer: Self-pay

## 2020-02-22 DIAGNOSIS — R4189 Other symptoms and signs involving cognitive functions and awareness: Secondary | ICD-10-CM

## 2020-02-22 DIAGNOSIS — I10 Essential (primary) hypertension: Secondary | ICD-10-CM

## 2020-02-22 DIAGNOSIS — I6523 Occlusion and stenosis of bilateral carotid arteries: Secondary | ICD-10-CM

## 2020-02-22 DIAGNOSIS — L989 Disorder of the skin and subcutaneous tissue, unspecified: Secondary | ICD-10-CM | POA: Insufficient documentation

## 2020-02-22 LAB — CBC
HCT: 35 % — ABNORMAL LOW (ref 39.0–52.0)
Hemoglobin: 11.7 g/dL — ABNORMAL LOW (ref 13.0–17.0)
MCHC: 33.4 g/dL (ref 30.0–36.0)
MCV: 95 fl (ref 78.0–100.0)
Platelets: 127 10*3/uL — ABNORMAL LOW (ref 150.0–400.0)
RBC: 3.69 Mil/uL — ABNORMAL LOW (ref 4.22–5.81)
RDW: 16 % — ABNORMAL HIGH (ref 11.5–15.5)
WBC: 4.3 10*3/uL (ref 4.0–10.5)

## 2020-02-22 LAB — COMPREHENSIVE METABOLIC PANEL
ALT: 14 U/L (ref 0–53)
AST: 21 U/L (ref 0–37)
Albumin: 4.3 g/dL (ref 3.5–5.2)
Alkaline Phosphatase: 84 U/L (ref 39–117)
BUN: 26 mg/dL — ABNORMAL HIGH (ref 6–23)
CO2: 25 mEq/L (ref 19–32)
Calcium: 9.4 mg/dL (ref 8.4–10.5)
Chloride: 103 mEq/L (ref 96–112)
Creatinine, Ser: 1.24 mg/dL (ref 0.40–1.50)
GFR: 55.86 mL/min — ABNORMAL LOW (ref >60.00)
Glucose, Bld: 92 mg/dL (ref 70–99)
Potassium: 3.9 mEq/L (ref 3.5–5.1)
Sodium: 136 mEq/L (ref 135–145)
Total Bilirubin: 0.9 mg/dL (ref 0.2–1.2)
Total Protein: 6.9 g/dL (ref 6.0–8.3)

## 2020-02-22 LAB — TSH: TSH: 1.81 u[IU]/mL (ref 0.35–4.50)

## 2020-02-22 NOTE — Progress Notes (Signed)
[name], MD Phone: [phone]  Bryan [name] is a 79 y.o. male who presents today for f/u.  HYPERTENSION  Disease Monitoring  Home BP Monitoring 160-200/90s-100 over the past 3 days  Chest pain- no    Dyspnea- no Medications  Compliance-  Taking lasix, lisinopril, metoprolol.   Edema- no  Skin lesion: This has been going on since early August.  It will scab up and then the scab will fall off and he will have a small open wound.  Notes the area has not been healing.  No itching.  No pain.  He has a dermatology appointment in December though wonders if he can be seen sooner.  Memory difficulty: This has been an ongoing issue.  He forgets the names of people that he knows.  No confusion.  Has not gotten lost.  No ADL issues.  He has an appointment with neurology later this month.    Social History   Tobacco Use  Smoking Status Former Smoker  . Packs/day: 1.00  . Years: 32.00  . Pack years: 32.00  . Types: Cigarettes  . Quit date: 07/05/1981  . Years since quitting: 38.6  Smokeless Tobacco Never Used     ROS see history of present illness  Objective  Physical Exam Vitals:   02/22/20 1033  BP: (!) 160/80  Pulse: 64  Temp: 97.7 F (36.5 C)  SpO2: 94%    BP Readings from Last 3 Encounters:  02/22/20 (!) 160/80  02/12/20 139/61  01/29/20 (!) 141/59   Wt Readings from Last 3 Encounters:  02/22/20 171 lb 3.2 oz (77.7 kg)  02/12/20 178 lb 9.2 oz (81 kg)  01/16/20 173 lb (78.5 kg)    Physical Exam Constitutional:      General: He is not in acute distress.    Appearance: He is not diaphoretic.  HENT:     Head:   Cardiovascular:     Rate and Rhythm: Normal rate and regular rhythm.     Heart sounds: Normal heart sounds.  Pulmonary:     Effort: Pulmonary effort is normal.     Breath sounds: Normal breath sounds.  Musculoskeletal:     Right lower leg: No edema.     Left lower leg: No edema.  Skin:    General: Skin is warm and dry.    Neurological:     Mental Status: He is alert.      Assessment/Plan: Please see individual problem list.  Essential hypertension Elevated over the last several days.  He will continue his Lasix as prescribed.  Also continue lisinopril 20 mg daily and metoprolol 25 mg twice daily.  We will check lab work to determine if there is a contributing cause to his elevated blood pressure and then consider adding a additional BP medication.  Cognitive decline He will keep his appointment with neurology.  Advised to be careful when driving and to monitor for any worsening.  Skin lesion Could represent a squamous cell carcinoma.  Less likely a basal cell carcinoma.  We will refer to dermatology to see if we can get him seen sooner than December.    No orders of the defined types were placed in this encounter.   No orders of the defined types were placed in this encounter.   Rhyder was seen today for follow-up.  Diagnoses and all orders for this visit:  Essential hypertension  Cognitive decline  Skin lesion     This visit occurred during the SARS-CoV-2 public health emergency.  Safety protocols were in place, including screening questions prior to the visit, additional usage of staff PPE, and extensive cleaning of exam room while observing appropriate contact time as indicated for disinfecting solutions.    [name], MD [place]

## 2020-02-22 NOTE — Patient Instructions (Signed)
Nice to see you. We will get lab work today and contact you with the results. We will refer you to dermatology as well.  If you do not hear anything regarding an appointment within 1 to 2 weeks please let us know.

## 2020-02-22 NOTE — Assessment & Plan Note (Signed)
Elevated over the last several days.  He will continue his Lasix as prescribed.  Also continue lisinopril 20 mg daily and metoprolol 25 mg twice daily.  We will check lab work to determine if there is a contributing cause to his elevated blood pressure and then consider adding a additional BP medication.

## 2020-02-22 NOTE — Assessment & Plan Note (Signed)
He will keep his appointment with neurology.  Advised to be careful when driving and to monitor for any worsening.

## 2020-02-22 NOTE — Assessment & Plan Note (Signed)
Could represent a squamous cell carcinoma.  Less likely a basal cell carcinoma.  We will refer to dermatology to see if we can get him seen sooner than December.

## 2020-02-25 ENCOUNTER — Telehealth: Payer: Self-pay | Admitting: Family Medicine

## 2020-02-25 NOTE — Telephone Encounter (Signed)
Pt had labs on 02/22/2020 and would like his lab results. Please advise and Thank you!  Call pt @ [phone].

## 2020-02-25 NOTE — Telephone Encounter (Signed)
I called and spoke with the patient and informed him of his results.  Flay Ghosh,cma

## 2020-03-08 ENCOUNTER — Other Ambulatory Visit: Payer: Self-pay | Admitting: Cardiovascular Disease

## 2020-03-11 ENCOUNTER — Ambulatory Visit: Payer: Medicare Other | Admitting: Cardiovascular Disease

## 2020-03-11 ENCOUNTER — Ambulatory Visit: Payer: Medicare Other

## 2020-03-13 ENCOUNTER — Ambulatory Visit (INDEPENDENT_AMBULATORY_CARE_PROVIDER_SITE_OTHER): Payer: Medicare Other

## 2020-03-13 ENCOUNTER — Other Ambulatory Visit: Payer: Self-pay

## 2020-03-13 VITALS — BP 131/63 | HR 52

## 2020-03-13 DIAGNOSIS — I10 Essential (primary) hypertension: Secondary | ICD-10-CM | POA: Diagnosis not present

## 2020-03-13 DIAGNOSIS — R413 Other amnesia: Secondary | ICD-10-CM | POA: Diagnosis not present

## 2020-03-13 NOTE — Progress Notes (Signed)
Patient is here for a BP check due to bp being high at last visit, as per patient.  Currently patients BP is 131/63 and BPM is 52.  Patient has no complaints of headaches, blurry vision, chest pain, arm pain, light headedness, dizziness, and nor jaw pain. Please see previous note for order.

## 2020-03-14 ENCOUNTER — Other Ambulatory Visit: Payer: Self-pay | Admitting: Family Medicine

## 2020-03-25 ENCOUNTER — Other Ambulatory Visit: Payer: Self-pay

## 2020-03-25 ENCOUNTER — Other Ambulatory Visit: Payer: Medicare Other

## 2020-03-25 ENCOUNTER — Inpatient Hospital Stay: Payer: Medicare Other | Attending: Hematology and Oncology

## 2020-03-25 DIAGNOSIS — D696 Thrombocytopenia, unspecified: Secondary | ICD-10-CM | POA: Insufficient documentation

## 2020-03-25 DIAGNOSIS — D509 Iron deficiency anemia, unspecified: Secondary | ICD-10-CM

## 2020-03-25 LAB — CBC WITH DIFFERENTIAL/PLATELET
Abs Immature Granulocytes: 0.01 10*3/uL (ref 0.00–0.07)
Basophils Absolute: 0 10*3/uL (ref 0.0–0.1)
Basophils Relative: 1 %
Eosinophils Absolute: 0.1 10*3/uL (ref 0.0–0.5)
Eosinophils Relative: 2 %
HCT: 33.5 % — ABNORMAL LOW (ref 39.0–52.0)
Hemoglobin: 11.1 g/dL — ABNORMAL LOW (ref 13.0–17.0)
Immature Granulocytes: 0 %
Lymphocytes Relative: 20 %
Lymphs Abs: 0.8 10*3/uL (ref 0.7–4.0)
MCH: 30.8 pg (ref 26.0–34.0)
MCHC: 33.1 g/dL (ref 30.0–36.0)
MCV: 93.1 fL (ref 80.0–100.0)
Monocytes Absolute: 0.6 10*3/uL (ref 0.1–1.0)
Monocytes Relative: 13 %
Neutro Abs: 2.7 10*3/uL (ref 1.7–7.7)
Neutrophils Relative %: 64 %
Platelets: 119 10*3/uL — ABNORMAL LOW (ref 150–400)
RBC: 3.6 MIL/uL — ABNORMAL LOW (ref 4.22–5.81)
RDW: 14.6 % (ref 11.5–15.5)
WBC: 4.3 10*3/uL (ref 4.0–10.5)
nRBC: 0 % (ref 0.0–0.2)

## 2020-04-11 DIAGNOSIS — M1711 Unilateral primary osteoarthritis, right knee: Secondary | ICD-10-CM | POA: Diagnosis not present

## 2020-04-15 DIAGNOSIS — D693 Immune thrombocytopenic purpura: Secondary | ICD-10-CM | POA: Diagnosis not present

## 2020-04-15 DIAGNOSIS — M810 Age-related osteoporosis without current pathological fracture: Secondary | ICD-10-CM | POA: Diagnosis not present

## 2020-04-15 DIAGNOSIS — M25531 Pain in right wrist: Secondary | ICD-10-CM | POA: Diagnosis not present

## 2020-04-15 DIAGNOSIS — M0579 Rheumatoid arthritis with rheumatoid factor of multiple sites without organ or systems involvement: Secondary | ICD-10-CM | POA: Diagnosis not present

## 2020-04-18 ENCOUNTER — Other Ambulatory Visit: Payer: Self-pay | Admitting: Cardiovascular Disease

## 2020-04-21 DIAGNOSIS — R413 Other amnesia: Secondary | ICD-10-CM | POA: Diagnosis not present

## 2020-04-21 DIAGNOSIS — I10 Essential (primary) hypertension: Secondary | ICD-10-CM | POA: Diagnosis not present

## 2020-04-21 DIAGNOSIS — E782 Mixed hyperlipidemia: Secondary | ICD-10-CM | POA: Diagnosis not present

## 2020-04-21 DIAGNOSIS — I6523 Occlusion and stenosis of bilateral carotid arteries: Secondary | ICD-10-CM | POA: Diagnosis not present

## 2020-04-22 NOTE — Progress Notes (Signed)
Date:  04/23/2020   ID:  Bryan [name], DOB September 21, 1940, MRN [id number]  Patient Location:  [address] [place] [address]   Provider location:   [facility], [place] office  PCP:  [name], MD  Cardiologist:  Arvid Right [facility]  Chief Complaint  Patient presents with  . Follow-up    3 Months follow up and per patient he has no issues but would like to discuss coming off Eliquis. Medications verbally reviewed with patient.     History of Present Illness:    Bryan [name] is a 79 y.o. 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 bicuspid aortic valve noted in 2009   cardiac catheterization showing mild to moderate CAD,  AVR with bioprosthetic valve, ascending aorta grafting in 2010,  hypertension,  hyperlipidemia,  mild chronic renal insufficiency, total knee replacement in 2004, appendix rupture and gallbladder surgery in 2010,  postoperative atrial fibrillation in 2010 pulm HTN on echo 2017 Valve with mean gradient 20 mm Hg, moderate, prosthetic valve who presents for routine follow-up of his bioprosthetic aortic valve and dilated ascending aorta   No family presents with him today Feels well today, Bradycardia noted,  Reports he is taking amlodipine QAM ("Dr. Caryl Bis started it 10/21") Notes reviewed, no record of this  Drug store delivers medications Has 2 sisters helping  Significant gait instability, denies any falls Lives by himself in an apartment Feels he is managing okay independently  Lab work reviewed CBC stable Reports his taking eliquis  Weight is higher today 180, reports he takes Lasix as needed for ankle swelling Feels a higher weight is from his jacket and shoes Previously baseline weight more in the 160s up to 170 With fluid was 178 Denies any leg swelling or abdominal distention  EKG personally reviewed by myself on todays visit Atrial  fib, slow ventricular response, rate 39 to 42  Other past medical history reviewed  confusion January 09, 2020, was seen in the emergency room was not making sense, had a headache, was vomiting Was seen in the emergency room but left secondary to high patient volume and he was feeling better Feels back to his baseline today  Prior review of weights over the past 6 months Seen in CHF clinic January 02, 2020, weight at that time 162 pounds Weight  November 23, 2019 166 pounds Weight April 2021 was 160 pounds Weight in oncology 178 pounds performed yesterday His home weight today 168 pounds but is 173 pounds in our office  Echocardiogram November 13, 2019 performed for shortness of breath Normal ejection fraction, moderate pulmonary hypertension Moderate aortic valve stenosis Mean gradient 26 mmHg Dilated IVC  CT scan chest June 2021 with mild/small pleural effusion  Chronic back pain, multiple surgery "better" had hardware taken out Several back surgeries Back surgery  5/18 chroni back and leg pain, Fx of L4, Cement  Fusion  Past Medical History:  Diagnosis Date  . Abnormal CT scan    Asymmetric left rectal wall thickening   . Anemia   . Anxiety   . Arrhythmia   . CHF (congestive heart failure) ([place])   . Chronic lower back pain   . Complication of anesthesia    Memory loss 09/2015  . Coronary artery disease   . Depression   . GERD (gastroesophageal reflux disease)   . Glaucoma   . H/O thoracic aortic aneurysm repair   .  Headache   . Heart murmur   . History of being hospitalized    memory lose kidney funtion down blood pressure up  . History of blood transfusion    "think he had one when he had heart valve OR" (10/14/2016); "none since" (10/19/2017)  . History of chicken pox   . Hyperlipidemia   . Hypertension   . Lumbar stenosis   . Murmur   . Neuropathy   . Osteoarthritis    worse in feet and ankles  . Paroxysmal A-fib ([place])   . Poor short term memory    takes Aricept   . Rheumatoid arthritis ([place])    "all over" (10/14/2016)  . Schizophrenia ([place])   . Seasonal allergies   . Thoracic spine pain   . Valvular heart disease   . Wears glasses    reading   Past Surgical History:  Procedure Laterality Date  . AORTIC VALVE REPLACEMENT  2007   [facility].  [place], [place]; "pig valve"  . APPENDECTOMY  2010  . BACK SURGERY    . CARDIAC VALVE REPLACEMENT    . CATARACT EXTRACTION W/ INTRAOCULAR LENS  IMPLANT, BILATERAL Bilateral 2012  . COLONOSCOPY WITH PROPOFOL N/A 09/24/2016   Procedure: COLONOSCOPY WITH PROPOFOL;  Surgeon: Jonathon Bellows, MD;  Location: [facility] ENDOSCOPY;  Service: Endoscopy;  Laterality: N/A;  . ESOPHAGOGASTRODUODENOSCOPY (EGD) WITH PROPOFOL N/A 09/24/2016   Procedure: ESOPHAGOGASTRODUODENOSCOPY (EGD) WITH PROPOFOL;  Surgeon: Jonathon Bellows, MD;  Location: [facility] ENDOSCOPY;  Service: Endoscopy;  Laterality: N/A;  . GIVENS CAPSULE STUDY N/A 09/24/2016   Procedure: GIVENS CAPSULE STUDY;  Surgeon: Jonathon Bellows, MD;  Location: [facility] ENDOSCOPY;  Service: Endoscopy;  Laterality: N/A;  . HAMMER TOE SURGERY Right 10/09/2015   Procedure: HAMMER TOE REPAIR WITH K-WIRE FIXATION RIGHT SECOND TOE;  Surgeon: Albertine Patricia, DPM;  Location: [place];  Service: Podiatry;  Laterality: Right;  WITH LOCAL  . HARDWARE REMOVAL N/A 11/02/2019   Procedure: Removal of hardware thoracic spine;  Surgeon: Kary Kos, MD;  Location: [place];  Service: Neurosurgery;  Laterality: N/A;  posterior  . INGUINAL HERNIA REPAIR Right 05/05/2018   Medium Bard PerFix plug.  Surgeon: Robert Bellow, MD;  Location: [facility] ORS;  Service: General;  Laterality: Right;  . IR VERTEBROPLASTY CERV/THOR BX INC UNI/BIL INC/INJECT/IMAGING  10/27/2018  . JOINT REPLACEMENT Left    left total knee  . LAMINECTOMY WITH POSTERIOR LATERAL ARTHRODESIS LEVEL 3 N/A 09/28/2017   Procedure: LUMBAR  POSTEROR  FUSION REVISION - LUMBAR ONE -TWO, LUMBAR TWO -THREE,THREE-FOUR ,BILATERAL ARTHRODESIS REMOVAL LUMBAR  THREE HARDWARE;  Surgeon: Kary Kos, MD;  Location: [place];  Service: Neurosurgery;  Laterality: N/A;  . LAMINECTOMY WITH POSTERIOR LATERAL ARTHRODESIS LEVEL 3 N/A 10/20/2017   Procedure: Revision fusion and removal of hardware Lumbar one, Pedicle screw fixation thoracic ten-lumbar two, thoracic nine-ten laminotomy, Posterior Lumbar Arthrodesis thoracic ten-lumbar two ;  Surgeon: Kary Kos, MD;  Location: [place];  Service: Neurosurgery;  Laterality: N/A;  . LAPAROSCOPIC CHOLECYSTECTOMY  2010  . LUMBAR FUSION Right 06/08/2017   LUMBAR THREE-FOUR, LUMBAR FOUR-FIVE POSTEROLATERAL ARTHRODESIS WITH RIGHT LUMBAR FOUR-FIVE LAMINECTOMY/FORAMINOTOMY  . LUMBAR LAMINECTOMY/DECOMPRESSION MICRODISCECTOMY Right 03/08/2016   Procedure: Laminectomy and Foraminotomy - Lumbar Five-Sacral One Right;  Surgeon: Kary Kos, MD;  Location: [place];  Service: Neurosurgery;  Laterality: Right;  Right  . MULTIPLE TOOTH EXTRACTIONS     "3"  . POSTERIOR LUMBAR FUSION  10/14/2016   L5-S1  . REPLACEMENT TOTAL KNEE Left 2003  . THORACIC AORTIC  ANEURYSM REPAIR  2010     Current Meds  Medication Sig  . aspirin EC 81 MG tablet Take 1 tablet (81 mg total) by mouth daily. (Patient taking differently: Take 81 mg by mouth daily with supper. )  . atorvastatin (LIPITOR) 40 MG tablet TAKE ONE TABLET BY MOUTH EVERY DAY (Patient taking differently: Take 40 mg by mouth at bedtime. )  . calcium carbonate (OS-CAL - DOSED IN MG OF ELEMENTAL CALCIUM) 1250 (500 Ca) MG tablet Take 1 tablet by mouth daily with lunch.  . Carboxymethylcellul-Glycerin (LUBRICATING EYE DROPS OP) Place 1 drop into both eyes daily as needed (dry eyes).  . cetirizine (ZYRTEC) 10 MG tablet Take 10 mg by mouth daily.  Marland Kitchen donepezil (ARICEPT) 5 MG tablet SMARTSIG:1 Tablet(s) By Mouth Every Evening  . ELIQUIS 5 MG TABS tablet TAKE 1 TABLET BY MOUTH TWICE DAILY  . fenofibrate 160 MG tablet TAKE ONE TABLET AT BEDTIME  . FLUoxetine (PROZAC) 20 MG capsule TAKE 1 CAPSULE BY  MOUTH ONCE DAILY  . folic acid (FOLVITE) 1 MG tablet Take 1 mg by mouth in the morning and at bedtime.   . gabapentin (NEURONTIN) 300 MG capsule TAKE 1 CAPSULE BY MOUTH 3 TIMES DAILY.  Marland Kitchen HYDROcodone-acetaminophen (NORCO) 10-325 MG tablet Take 1 tablet by mouth 4 (four) times daily as needed for moderate pain.   . hydrocortisone cream 1 % Apply 1 application topically daily as needed for itching.  . hydroxychloroquine (PLAQUENIL) 200 MG tablet Take 200 mg by mouth 3 (three) times a week. Tue, Thurs, and Sat  . lisinopril (ZESTRIL) 20 MG tablet TAKE 1 TABLET BY MOUTH EVERY EVENING  . metoprolol tartrate (LOPRESSOR) 25 MG tablet TAKE ONE-HALF TABLET BY MOUTH TWICE DAILY  . Multiple Vitamins-Minerals (PRESERVISION AREDS 2) CAPS Take 1 capsule by mouth 2 (two) times daily.  Marland Kitchen testosterone cypionate (DEPOTESTOSTERONE CYPIONATE) 200 MG/ML injection Inject 0.25 cc every 2 weeks (Patient taking differently: Inject 100 mg into the muscle every 14 (fourteen) days. )  . traZODone (DESYREL) 50 MG tablet TAKE 1 TABLET AT BEDTIME MAY TAKE 2 TABLETS AT BEDTIME IF NEEDED  . vitamin B-12 (CYANOCOBALAMIN) 1000 MCG tablet Take 1,000 mcg by mouth daily with lunch.   . vitamin E 180 MG (400 UNITS) capsule Take 800 Units by mouth daily with lunch.     Allergies:   Penicillins and Demerol [meperidine]   Social History   Tobacco Use  . Smoking status: Former Smoker    Packs/day: 1.00    Years: 32.00    Pack years: 32.00    Types: Cigarettes    Quit date: 07/05/1981    Years since quitting: 38.8  . Smokeless tobacco: Never Used  Vaping Use  . Vaping Use: Never used  Substance Use Topics  . Alcohol use: Yes    Comment: "glass of wine/month; if that"  . Drug use: Never     Family Hx: The patient's family history includes Asthma in his mother; Heart attack (age of onset: 58) in his father; Heart failure in his mother; Hypertension in his mother and sister; Osteoporosis in his mother. There is no history of  Prostate cancer, Bladder Cancer, Kidney cancer, or Colon cancer.  ROS:   Please see the history of present illness.    Review of Systems  Constitutional: Negative.        Weight gain  HENT: Negative.   Respiratory: Negative.   Cardiovascular: Negative.   Gastrointestinal: Negative.   Musculoskeletal: Negative.   Neurological: Negative.  Psychiatric/Behavioral: Negative.   All other systems reviewed and are negative.    Labs/Other Tests and Data Reviewed:    Recent Labs: 11/12/2019: B Natriuretic Peptide 461.9 02/22/2020: ALT 14; BUN 26; Creatinine, Ser 1.24; Potassium 3.9; Sodium 136; TSH 1.81 03/25/2020: Hemoglobin 11.1; Platelets 119   Recent Lipid Panel Lab Results  Component Value Date/Time   CHOL 123 09/11/2019 03:38 PM   TRIG 96 09/11/2019 03:38 PM   HDL 58 09/11/2019 03:38 PM   CHOLHDL 2.1 09/11/2019 03:38 PM   CHOLHDL 2 12/16/2016 10:06 AM   LDLCALC 47 09/11/2019 03:38 PM    Wt Readings from Last 3 Encounters:  04/23/20 182 lb (82.6 kg)  02/22/20 171 lb 3.2 oz (77.7 kg)  02/12/20 178 lb 9.2 oz (81 kg)     Exam:    BP 116/62 (BP Location: Left Arm, Patient Position: Sitting, Cuff Size: Normal)   Pulse (!) 42   Ht 5\' 11"  (1.803 m)   Wt 182 lb (82.6 kg)   SpO2 95%   BMI 25.38 kg/m  Constitutional:  oriented to person, place, and time. No distress.  HENT:  Head: Grossly normal Eyes:  no discharge. No scleral icterus.  Neck: No JVD, no carotid bruits  Cardiovascular: Regular rate and rhythm, no murmurs appreciated Pulmonary/Chest: Clear to auscultation bilaterally, no wheezes or rails Abdominal: Soft.  no distension.  no tenderness.  Musculoskeletal: Normal range of motion Neurological:  normal muscle tone. Coordination normal. No atrophy Skin: Skin warm and dry Psychiatric: normal affect, pleasant   ASSESSMENT & PLAN:    Permanent atrial fibrillation (HCC) -  Permanent atrial fibrillation,  Marked bradycardia noted on today's visit, reports he is  asymptomatic  severely dilated left atrium on prior echocardiogram Tolerating Eliquis 5 mg twice daily Recommend he hold the metoprolol  Marked bradycardia Rate 39 up to 42 bpm, will hold metoprolol as above Reports he is asymptomatic Hold amlodipine as below Recheck EKG 1 week If he remains persistently bradycardic, likely component of sick sinus syndrome, further work-up may be needed  Essential hypertension - Plan: EKG 12-Lead Blood pressure low, given his marked bradycardia recommended he hold the amlodipine Reports this was started by primary care in early October, no record of this Suspect it was on his medication list from [place] that carried over to his list We will hold it for now  CAD in native artery - Plan: EKG 12-Lead Currently with no symptoms of angina. No further workup at this time. Continue current medication regimen.  Acute on chronic diastolic CHF On today's visit reports he is taking Lasix as needed Appears relatively euvolemic apart from higher weight but he reports this was inaccurate secondary to shoes and heavy coat Recommend he stay vigilant with his ankle swelling, abdominal distention, daily weights at home Currently only taking Lasix as needed  Aneurysm of ascending aorta, s/post repair/grafting Echocardiogram with no significant change in aorta size 3.9 cm Periodic echo  Mixed hyperlipidemia Numbers at goal, no medication changes made  S/P AVR (aortic valve replacement) Stable on echocardiogram Moderate gradient, will need to monitor closely  Cognitive decline No family presenting with him today On prior visit referral made to neurology at request of family  Extensive discussion with him concerning his medications, medication list is not complete, discussed bradycardia, medication changes in detail were discussed with him, need for close follow-up Discussed home situation  Total encounter time more than 35 minutes  Greater than 50%  was spent in counseling and coordination of  care with the patient    Signed, Ida Rogue, MD  04/23/2020 9:59 AM    [place] Office [address] [place] [address], [place], [place] [address]

## 2020-04-23 ENCOUNTER — Other Ambulatory Visit: Payer: Self-pay

## 2020-04-23 ENCOUNTER — Encounter: Payer: Self-pay | Admitting: Cardiovascular Disease

## 2020-04-23 ENCOUNTER — Ambulatory Visit (INDEPENDENT_AMBULATORY_CARE_PROVIDER_SITE_OTHER): Payer: Medicare Other | Admitting: Cardiovascular Disease

## 2020-04-23 VITALS — BP 116/62 | HR 42 | Ht 71.0 in | Wt 182.0 lb

## 2020-04-23 DIAGNOSIS — I10 Essential (primary) hypertension: Secondary | ICD-10-CM | POA: Diagnosis not present

## 2020-04-23 DIAGNOSIS — Z952 Presence of prosthetic heart valve: Secondary | ICD-10-CM | POA: Diagnosis not present

## 2020-04-23 DIAGNOSIS — I272 Pulmonary hypertension, unspecified: Secondary | ICD-10-CM

## 2020-04-23 DIAGNOSIS — E782 Mixed hyperlipidemia: Secondary | ICD-10-CM

## 2020-04-23 DIAGNOSIS — N179 Acute kidney failure, unspecified: Secondary | ICD-10-CM | POA: Diagnosis not present

## 2020-04-23 DIAGNOSIS — I5032 Chronic diastolic (congestive) heart failure: Secondary | ICD-10-CM

## 2020-04-23 DIAGNOSIS — I4819 Other persistent atrial fibrillation: Secondary | ICD-10-CM

## 2020-04-23 DIAGNOSIS — I6523 Occlusion and stenosis of bilateral carotid arteries: Secondary | ICD-10-CM | POA: Diagnosis not present

## 2020-04-23 DIAGNOSIS — I7121 Aneurysm of the ascending aorta, without rupture: Secondary | ICD-10-CM

## 2020-04-23 DIAGNOSIS — I712 Thoracic aortic aneurysm, without rupture: Secondary | ICD-10-CM

## 2020-04-23 NOTE — Patient Instructions (Addendum)
Medication Instructions:  Hold the amlodipine  Hold the metoprolol , heart rate is very slow  If you need a refill on your cardiac medications before your next appointment, please call your pharmacy.    Lab work: No new labs needed   If you have labs (blood work) drawn today and your tests are completely normal, you will receive your results only by: Marland Kitchen MyChart Message (if you have MyChart) OR . A paper copy in the mail If you have any lab test that is abnormal or we need to change your treatment, we will call you to review the results.   Testing/Procedures: No new testing needed   Follow-Up: At [facility], you and your health needs are our priority.  As part of our continuing mission to provide you with exceptional heart care, we have created designated Provider Care Teams.  These Care Teams include your primary Cardiologist (physician) and Advanced Practice Providers (APPs -  Physician Assistants and Nurse Practitioners) who all work together to provide you with the care you need, when you need it.  . You will need a follow up appointment in 1 week  . Providers on your designated Care Team:   . Murray Hodgkins, NP . Christell Faith, PA-C . Marrianne Mood, PA-C  Any Other Special Instructions Will Be Listed Below (If Applicable).  COVID-19 Vaccine Information can be found at: ShippingScam.co.uk For questions related to vaccine distribution or appointments, please email [email or web address] .[email or web address] or call [phone].

## 2020-04-28 ENCOUNTER — Ambulatory Visit (INDEPENDENT_AMBULATORY_CARE_PROVIDER_SITE_OTHER): Payer: Medicare Other | Admitting: Dermatology

## 2020-04-28 ENCOUNTER — Other Ambulatory Visit: Payer: Self-pay

## 2020-04-28 DIAGNOSIS — I6523 Occlusion and stenosis of bilateral carotid arteries: Secondary | ICD-10-CM

## 2020-04-28 DIAGNOSIS — C44319 Basal cell carcinoma of skin of other parts of face: Secondary | ICD-10-CM

## 2020-04-28 DIAGNOSIS — C4491 Basal cell carcinoma of skin, unspecified: Secondary | ICD-10-CM

## 2020-04-28 DIAGNOSIS — L578 Other skin changes due to chronic exposure to nonionizing radiation: Secondary | ICD-10-CM

## 2020-04-28 DIAGNOSIS — D485 Neoplasm of uncertain behavior of skin: Secondary | ICD-10-CM

## 2020-04-28 HISTORY — DX: Basal cell carcinoma of skin, unspecified: C44.91

## 2020-04-28 NOTE — Patient Instructions (Signed)

## 2020-04-28 NOTE — Progress Notes (Signed)
   New Patient Visit  Subjective  Bryan [name] is a 79 y.o. male who presents for the following: Other (Spot on forehead since ~August - scabs and bleeds.).  The following portions of the chart were reviewed this encounter and updated as appropriate:  Tobacco  Allergies  Meds  Problems  Med Hx  Surg Hx  Fam Hx     Review of Systems:  No other skin or systemic complaints except as noted in HPI or Assessment and Plan.  Objective  Well appearing patient in no apparent distress; mood and affect are within normal limits.  A focused examination was performed including face. Relevant physical exam findings are noted in the Assessment and Plan.  Objective  Left Forehead: 0.8 cm crust with erosion with 2.5 cm surrounding erythema.        Assessment & Plan    Actinic Damage - chronic, secondary to cumulative UV radiation exposure/sun exposure over time - diffuse scaly erythematous macules with underlying dyspigmentation - Recommend daily broad spectrum sunscreen SPF 30+ to sun-exposed areas, reapply every 2 hours as needed.  - Call for new or changing lesions.  Neoplasm of uncertain behavior of skin Left Forehead  Skin / nail biopsy Type of biopsy: tangential   Informed consent: discussed and consent obtained   Timeout: patient name, date of birth, surgical site, and procedure verified   Procedure prep:  Patient was prepped and draped in usual sterile fashion Prep type:  Isopropyl alcohol Anesthesia: the lesion was anesthetized in a standard fashion   Anesthetic:  1% lidocaine w/ epinephrine 1-100,000 buffered w/ 8.4% NaHCO3 Instrument used: flexible razor blade   Hemostasis achieved with: pressure, aluminum chloride and electrodesiccation   Outcome: patient tolerated procedure well   Post-procedure details: sterile dressing applied and wound care instructions given   Dressing type: bandage and petrolatum    Specimen 1 - Surgical pathology Differential Diagnosis:  BCC vs other  Check Margins: No 0.8 cm crust with erosion with 2.5 cm surrounding erythema.  Return pending biopsy results.  I, Ashok Cordia, CMA, am acting as scribe for Sarina Ser, MD .  Documentation: I have reviewed the above documentation for accuracy and completeness, and I agree with the above.  Sarina Ser, MD

## 2020-04-29 ENCOUNTER — Telehealth: Payer: Self-pay

## 2020-04-29 DIAGNOSIS — Z79891 Long term (current) use of opiate analgesic: Secondary | ICD-10-CM | POA: Diagnosis not present

## 2020-04-29 DIAGNOSIS — M5440 Lumbago with sciatica, unspecified side: Secondary | ICD-10-CM | POA: Diagnosis not present

## 2020-04-29 NOTE — Telephone Encounter (Signed)
Discussed biopsy results with pt  °

## 2020-04-30 ENCOUNTER — Other Ambulatory Visit: Payer: Self-pay

## 2020-04-30 ENCOUNTER — Ambulatory Visit (INDEPENDENT_AMBULATORY_CARE_PROVIDER_SITE_OTHER): Payer: Medicare Other

## 2020-04-30 ENCOUNTER — Encounter: Payer: Self-pay | Admitting: Family

## 2020-04-30 ENCOUNTER — Encounter: Payer: Self-pay | Admitting: Dermatology

## 2020-04-30 ENCOUNTER — Ambulatory Visit (INDEPENDENT_AMBULATORY_CARE_PROVIDER_SITE_OTHER): Payer: Medicare Other | Admitting: Family

## 2020-04-30 ENCOUNTER — Ambulatory Visit: Payer: Medicare Other | Admitting: Family

## 2020-04-30 VITALS — BP 160/90 | HR 57 | Ht 71.0 in | Wt 172.0 lb

## 2020-04-30 DIAGNOSIS — I4821 Permanent atrial fibrillation: Secondary | ICD-10-CM | POA: Diagnosis not present

## 2020-04-30 DIAGNOSIS — I5032 Chronic diastolic (congestive) heart failure: Secondary | ICD-10-CM

## 2020-04-30 DIAGNOSIS — R001 Bradycardia, unspecified: Secondary | ICD-10-CM

## 2020-04-30 DIAGNOSIS — I272 Pulmonary hypertension, unspecified: Secondary | ICD-10-CM | POA: Diagnosis not present

## 2020-04-30 DIAGNOSIS — Z7901 Long term (current) use of anticoagulants: Secondary | ICD-10-CM | POA: Diagnosis not present

## 2020-04-30 DIAGNOSIS — R42 Dizziness and giddiness: Secondary | ICD-10-CM

## 2020-04-30 DIAGNOSIS — I10 Essential (primary) hypertension: Secondary | ICD-10-CM

## 2020-04-30 MED ORDER — LISINOPRIL 20 MG PO TABS: 30.0000 mg | ORAL_TABLET | Freq: Every evening | ORAL | 1 refills | Status: DC

## 2020-04-30 NOTE — Patient Instructions (Addendum)
Medication Instructions:  Your physician has recommended you make the following change in your medication:   STOP Aspirin  CHANGE Lisinopril to 30mg  daily (1.5 tablets)  *If you need a refill on your cardiac medications before your next appointment, please call your pharmacy*   Lab Work: None ordered today.  Testing/Procedures: Your physician has recommended that you wear a Zio monitor. This monitor is a medical device that records the heart's electrical activity. Doctors most often use these monitors to diagnose arrhythmias. Arrhythmias are problems with the speed or rhythm of the heartbeat. The monitor is a small device applied to your chest. You can wear one while you do your normal daily activities. While wearing this monitor if you have any symptoms to push the button and record what you felt. Once you have worn this monitor for the period of time provider prescribed (7 days), you will return the monitor device in the postage paid box. Once it is returned they will download the data collected and provide [place] with a report which the provider will then review and we will call you with those results. Important tips:  1. Avoid showering during the first 24 hours of wearing the monitor. 2. Avoid excessive sweating to help maximize wear time. 3. Do not submerge the device, no hot tubs, and no swimming pools. 4. Keep any lotions or oils away from the patch. 5. After 24 hours you may shower with the patch on. Take brief showers with your back facing the shower head.  6. Do not remove patch once it has been placed because that will interrupt data and decrease adhesive wear time. 7. Push the button when you have any symptoms and write down what you were feeling. 8. Once you have completed wearing your monitor, remove and place into box which has postage paid and place in your outgoing mailbox.  9. If for some reason you have misplaced your box then call our office and we can provide another box  and/or mail it off for you.       Follow-Up: At [facility], you and your health needs are our priority.  As part of our continuing mission to provide you with exceptional heart care, we have created designated Provider Care Teams.  These Care Teams include your primary Cardiologist (physician) and Advanced Practice Providers (APPs -  Physician Assistants and Nurse Practitioners) who all work together to provide you with the care you need, when you need it.    Your next appointment:   6 week(s)  The format for your next appointment:   In Person  Provider:   You may see Ida Rogue, MD or one of the following Advanced Practice Providers on your designated Care Team:    Murray Hodgkins, NP  Christell Faith, PA-C  Marrianne Mood, PA-C  Cadence Chiefland, [place]  [name], NP

## 2020-04-30 NOTE — Progress Notes (Signed)
Office Visit    Patient Name: Bryan [name] Date of Encounter: 04/30/2020  Primary Care Provider:  [name], MD Primary Cardiologist:  Ida Rogue, MD Electrophysiologist:  None   Chief Complaint    Bryan [name] is a 79 y.o. male with a hx of nonobstructive CAD by [facility] in 2009, bicuspid aortic valve with ascending aortic aneurysm s/p dental procedure with bioprosthetic AVR and ascending aortic graft repair in 2010, PAF, pulmonary hypertension, HTN, HLD, iron deficiency anemia, back pain s/p multiple surgeries most recently 11/02/2019, HFpEF presents today for follow-up after discontinuation of amlodipine and metoprolol.  Past Medical History    Past Medical History:  Diagnosis Date  . Abnormal CT scan    Asymmetric left rectal wall thickening   . Anemia   . Anxiety   . Arrhythmia   . Basal cell carcinoma 04/28/2020   L forehead   . CHF (congestive heart failure) ([place])   . Chronic lower back pain   . Complication of anesthesia    Memory loss 09/2015  . Coronary artery disease   . Depression   . GERD (gastroesophageal reflux disease)   . Glaucoma   . H/O thoracic aortic aneurysm repair   . Headache   . Heart murmur   . History of being hospitalized    memory lose kidney funtion down blood pressure up  . History of blood transfusion    "think he had one when he had heart valve OR" (10/14/2016); "none since" (10/19/2017)  . History of chicken pox   . Hyperlipidemia   . Hypertension   . Lumbar stenosis   . Murmur   . Neuropathy   . Osteoarthritis    worse in feet and ankles  . Paroxysmal A-fib ([place])   . Poor short term memory    takes Aricept  . Rheumatoid arthritis ([place])    "all over" (10/14/2016)  . Schizophrenia ([place])   . Seasonal allergies   . Thoracic spine pain   . Valvular heart disease   . Wears glasses    reading   Past Surgical History:  Procedure Laterality Date  . AORTIC VALVE REPLACEMENT  2007   [facility].   [place], [place]; "pig valve"  . APPENDECTOMY  2010  . BACK SURGERY    . CARDIAC VALVE REPLACEMENT    . CATARACT EXTRACTION W/ INTRAOCULAR LENS  IMPLANT, BILATERAL Bilateral 2012  . COLONOSCOPY WITH PROPOFOL N/A 09/24/2016   Procedure: COLONOSCOPY WITH PROPOFOL;  Surgeon: Jonathon Bellows, MD;  Location: [facility] ENDOSCOPY;  Service: Endoscopy;  Laterality: N/A;  . ESOPHAGOGASTRODUODENOSCOPY (EGD) WITH PROPOFOL N/A 09/24/2016   Procedure: ESOPHAGOGASTRODUODENOSCOPY (EGD) WITH PROPOFOL;  Surgeon: Jonathon Bellows, MD;  Location: [facility] ENDOSCOPY;  Service: Endoscopy;  Laterality: N/A;  . GIVENS CAPSULE STUDY N/A 09/24/2016   Procedure: GIVENS CAPSULE STUDY;  Surgeon: Jonathon Bellows, MD;  Location: [facility] ENDOSCOPY;  Service: Endoscopy;  Laterality: N/A;  . HAMMER TOE SURGERY Right 10/09/2015   Procedure: HAMMER TOE REPAIR WITH K-WIRE FIXATION RIGHT SECOND TOE;  Surgeon: Albertine Patricia, DPM;  Location: [place];  Service: Podiatry;  Laterality: Right;  WITH LOCAL  . HARDWARE REMOVAL N/A 11/02/2019   Procedure: Removal of hardware thoracic spine;  Surgeon: Kary Kos, MD;  Location: [place];  Service: Neurosurgery;  Laterality: N/A;  posterior  . INGUINAL HERNIA REPAIR Right 05/05/2018   Medium Bard PerFix plug.  Surgeon: Robert Bellow, MD;  Location: [facility] ORS;  Service: General;  Laterality: Right;  .  IR VERTEBROPLASTY CERV/THOR BX INC UNI/BIL INC/INJECT/IMAGING  10/27/2018  . JOINT REPLACEMENT Left    left total knee  . LAMINECTOMY WITH POSTERIOR LATERAL ARTHRODESIS LEVEL 3 N/A 09/28/2017   Procedure: LUMBAR  POSTEROR  FUSION REVISION - LUMBAR ONE -TWO, LUMBAR TWO -THREE,THREE-FOUR ,BILATERAL ARTHRODESIS REMOVAL LUMBAR THREE HARDWARE;  Surgeon: Kary Kos, MD;  Location: [place];  Service: Neurosurgery;  Laterality: N/A;  . LAMINECTOMY WITH POSTERIOR LATERAL ARTHRODESIS LEVEL 3 N/A 10/20/2017   Procedure: Revision fusion and removal of hardware Lumbar one, Pedicle screw fixation thoracic ten-lumbar two, thoracic nine-ten  laminotomy, Posterior Lumbar Arthrodesis thoracic ten-lumbar two ;  Surgeon: Kary Kos, MD;  Location: [place];  Service: Neurosurgery;  Laterality: N/A;  . LAPAROSCOPIC CHOLECYSTECTOMY  2010  . LUMBAR FUSION Right 06/08/2017   LUMBAR THREE-FOUR, LUMBAR FOUR-FIVE POSTEROLATERAL ARTHRODESIS WITH RIGHT LUMBAR FOUR-FIVE LAMINECTOMY/FORAMINOTOMY  . LUMBAR LAMINECTOMY/DECOMPRESSION MICRODISCECTOMY Right 03/08/2016   Procedure: Laminectomy and Foraminotomy - Lumbar Five-Sacral One Right;  Surgeon: Kary Kos, MD;  Location: [place];  Service: Neurosurgery;  Laterality: Right;  Right  . MULTIPLE TOOTH EXTRACTIONS     "3"  . POSTERIOR LUMBAR FUSION  10/14/2016   L5-S1  . REPLACEMENT TOTAL KNEE Left 2003  . THORACIC AORTIC ANEURYSM REPAIR  2010    Allergies  Allergies  Allergen Reactions  . Penicillins Hives, Swelling and Other (See Comments)    SWELLING REACTION UNSPECIFIED PATIENT HAS TAKEN AMOXICILLIN ON MED HX FROM [facility] PATIENT HAS HAD A PCN REACTION WITH IMMEDIATE RASH, FACIAL/TONGUE/THROAT SWELLING, SOB, OR LIGHTHEADEDNESS WITH HYPOTENSION:  #  #  #  YES  #  #  #   Has patient had a PCN reaction causing severe rash involving mucus membranes or skin necrosis: No Has patient had a PCN reaction that required hospitalization No Has patient had a PCN reaction occurring within the last 10 years: No  . Demerol [Meperidine] Hives and Nausea And Vomiting    History of Present Illness    Bryan [name] is a 79 y.o. male with a hx of nonobstructive CAD by [place] in 2009, bicuspid aortic valve with ascending aortic aneurysm s/p dental procedure with bioprosthetic AVR and ascending aortic graft repair in 2010, PAF, pulmonary hypertension, HTN, HLD, iron deficiency anemia, back pain s/p multiple surgeries most recently 11/02/2019, HFpEF last seen by Dr. [name] 04/23/20.  Noted bicuspid aortic valve ~9.  Some cardiac cath with mild to moderate nonobstructive CAD.  Underwent Bentall procedure in 2010 with  postoperative atrial fibrillation.  Echo 01/2019 LVEF 55 to 60%, indeterminate LV diastolic function parameters, normal RV systolic function with mildly enlarged RV cavity size, mildly elevated RVSP at 37.5 mmHg, severely dilated LA, mildly dilated aortic root measuring 39 mm.  Noted in a simple dramatic atrial flutter at clinic 02/2019.  His aspirin was decreased to 81 mg daily and he was started on Eliquis.  Seen in follow-up 08/2019 continued asymptomatic atrial fibrillation.  Underwent successful removal of hardware from thoracic spine successful 1/21.  In the perioperative timeframe he held Eliquis and aspirin which were subsequently resumed 11/04/2019.  Presented to [facility] ED 11/12/19 for hemoptysis.  Chest x-ray with symmetrical bilateral airspace disease possibly secondary pulmonary edema versus pneumonia with cardiomegaly and aortic atherosclerosis.  CTA chest was negative for PE with cardiomegaly and small bilateral pleural effusions the pneumonia unable to be excluded.  Dilation of main pulmonary trunk with suggestive pulmonary hypertension.  CBC with hemoglobin 8.1 which had been 11.1 eleven days prior.  He was started  on IV Lasix with improvement in his symptoms.  Echocardiogram 11/13/2019 with normal LVEF, mean aortic valve gradient of 26 mmHg (up from 20 mmHg 01/2019), PA pressure moderately to severely elevated.  He was discharged on Lasix 40 mg twice daily.    Seen in follow up 11/23/19 - as he was euvolemic his Lasix was reduced to 40mg  QD with additional 40mg  as needed for weight gain. He was seen in follow up by [facility] HF clinic 12/2019 with minimal fatigue which was chronic. Reported improvement since iron infusions. No changes were made. Seen in follow up 01/16/20 due to weight gaain. Lasix was increased to 40mg  BID until he reached weight of 167lbs then reduce to once daily. Seen in follow up 04/23/20. Noting to only be taking Lasix as needed. He was bradycardic with rate 39-42bpm though asymptomatic. He  was recommended to stop Amlodipine and Metoprolol.  He presents today for follow-up.  He reports his heart rate at home has been routinely in the 60s when checked with his blood pressure cuff.  His systolic blood pressure has been running in the 140s.  He reports Monday last week he was removing his shirt and lost his balance and nearly fell.  He reports a very occasional lightheadedness no dizziness.  Denies fatigue.  He tells me he takes his Lasix approximately once a week for weight greater than 174 pounds.  Of note he had carotid duplex 04/21/2020 at [facility] and this result is unavailable in Care Everywhere.  EKGs/Labs/Other Studies Reviewed:   The following studies were reviewed today:  Echo 11/13/19  1. Left ventricular ejection fraction, by estimation, is 55 to 60%. The  left ventricle has normal function. The left ventricle has no regional  wall motion abnormalities. There is mild left ventricular hypertrophy.  Left ventricular diastolic parameters  are indeterminate.   2. Right ventricular systolic function is normal. The right ventricular  size is mildly enlarged. There is moderately elevated pulmonary artery  systolic pressure.   3. Left atrial size was moderately dilated.   4. Right atrial size was mildly dilated.   5. The mitral valve is normal in structure. Mild mitral valve  regurgitation.   6. Tricuspid valve regurgitation is moderate.   7. The aortic valve was not well visualized. Aortic valve regurgitation  is trivial. Mild to moderate aortic valve stenosis. Aortic valve mean  gradient measures 26.0 mmHg.   8. Aortic root/ascending aorta has been repaired/replaced. There is mild  dilatation of the aortic root measuring 44 mm.   9. The inferior vena cava is dilated in size with <50% respiratory  variability, suggesting right atrial pressure of 15 mmHg.   EKG:  EKG is ordered today.  The ekg ordered today demonstrates atrial fibrillation 57 bpm with incomplete LBBB and no  acute ST/T wave changes. Moderate voltage criteria for LVH noted.  Recent Labs: 11/12/2019: B Natriuretic Peptide 461.9 02/22/2020: ALT 14; BUN 26; Creatinine, Ser 1.24; Potassium 3.9; Sodium 136; TSH 1.81 03/25/2020: Hemoglobin 11.1; Platelets 119  Recent Lipid Panel    Component Value Date/Time   CHOL 123 09/11/2019 1538   TRIG 96 09/11/2019 1538   HDL 58 09/11/2019 1538   CHOLHDL 2.1 09/11/2019 1538   CHOLHDL 2 12/16/2016 1006   VLDL 18.0 12/16/2016 1006   LDLCALC 47 09/11/2019 1538    Home Medications   Current Meds  Medication Sig  . aspirin EC 81 MG tablet Take 1 tablet (81 mg total) by mouth daily.  Marland Kitchen atorvastatin (LIPITOR)  40 MG tablet TAKE ONE TABLET BY MOUTH EVERY DAY  . calcium carbonate (OS-CAL - DOSED IN MG OF ELEMENTAL CALCIUM) 1250 (500 Ca) MG tablet Take 1 tablet by mouth daily with lunch.  . Carboxymethylcellul-Glycerin (LUBRICATING EYE DROPS OP) Place 1 drop into both eyes daily as needed (dry eyes).  . cetirizine (ZYRTEC) 10 MG tablet Take 10 mg by mouth daily.  Marland Kitchen donepezil (ARICEPT) 5 MG tablet SMARTSIG:1 Tablet(s) By Mouth Every Evening  . ELIQUIS 5 MG TABS tablet TAKE 1 TABLET BY MOUTH TWICE DAILY  . fenofibrate 160 MG tablet TAKE ONE TABLET AT BEDTIME  . FLUoxetine (PROZAC) 20 MG capsule TAKE 1 CAPSULE BY MOUTH ONCE DAILY  . folic acid (FOLVITE) 1 MG tablet Take 1 mg by mouth in the morning and at bedtime.   . furosemide (LASIX) 40 MG tablet Take 1 tablet (40 mg total) by mouth as directed. Take twice a day for weight 165 or higher. Weight less than 165 take Once daily  . gabapentin (NEURONTIN) 300 MG capsule TAKE 1 CAPSULE BY MOUTH 3 TIMES DAILY.  Marland Kitchen HYDROcodone-acetaminophen (NORCO) 10-325 MG tablet Take 1 tablet by mouth 4 (four) times daily as needed for moderate pain.   . hydrocortisone cream 1 % Apply 1 application topically daily as needed for itching.  . hydroxychloroquine (PLAQUENIL) 200 MG tablet Take 200 mg by mouth 3 (three) times a week. Tue, Thurs,  and Sat  . lisinopril (ZESTRIL) 20 MG tablet TAKE 1 TABLET BY MOUTH EVERY EVENING  . Multiple Vitamins-Minerals (PRESERVISION AREDS 2) CAPS Take 1 capsule by mouth 2 (two) times daily.  Marland Kitchen testosterone cypionate (DEPOTESTOSTERONE CYPIONATE) 200 MG/ML injection Inject 0.25 cc every 2 weeks  . traZODone (DESYREL) 50 MG tablet TAKE 1 TABLET AT BEDTIME MAY TAKE 2 TABLETS AT BEDTIME IF NEEDED  . vitamin B-12 (CYANOCOBALAMIN) 1000 MCG tablet Take 1,000 mcg by mouth daily with lunch.   . vitamin E 180 MG (400 UNITS) capsule Take 800 Units by mouth daily with lunch.      Review of Systems  All other systems reviewed and are otherwise negative except as noted above.  Physical Exam    VS:  BP (!) 160/90 (BP Location: Right Arm, Patient Position: Sitting, Cuff Size: Normal)   Pulse (!) 57   Ht 5\' 11"  (1.803 m)   Wt 172 lb (78 kg)   SpO2 97%   BMI 23.99 kg/m  , BMI Body mass index is 23.99 kg/m. GEN: Well nourished, well developed, in no acute distress. HEENT: normal. Neck: Supple, no JVD, carotid bruits, or masses. Cardiac: irregularly irregular, no  rubs, or gallops. Gr 2/6 systolic murmur. No clubbing, cyanosis, edema.  Radials/DP/PT 2+ and equal bilaterally.  Respiratory:  Respirations regular and unlabored, clear to auscultation bilaterally. GI: Soft, nontender, nondistended MS: No deformity or atrophy. Skin: Warm and dry, no rash. Neuro:  Strength and sensation are intact. Psych: Normal affect.  Assessment & Plan    1. Chronic diastolic heart failure/pulmonary hypertension -Echo 11/13/2019 with normal LVEF and elevated PASP.  Euvolemic on Exam.  Denies Edema, Orthopnea, PND.  Taking His Lasix 40 Mg Approximately Once a Week.  Additional GDMT Includes Lisinopril.  No Beta-Blocker Secondary to Previous Bradycardia.  2. Persistent atrial fibrillation- Rate controlled by EKG today 57 bpm.  No recurrent bradycardia since stopping metoprolol and amlodipine.  Will place a 7-day ZIO XT monitor  today to rule out significant bradycardia or high degree AV block.  Continue Eliquis 5 mg twice  daily.  Denies bleeding complications.  Does not meet criteria for reduced dose.  To prevent bleeding we will discontinue aspirin today.  3. Anemia -denies recent bleeding.  03/25/2020 hemoglobin 11.1 stable at his baseline.  Continue to follow with hematology.  4. Bicuspid aortic valve/ascending aortic aneurysm s/p Bentall procedure 2010 -Echo 11/13/2019 with trivial AI and mild to moderate aortic valve stenosis with aortic valve mean gradient of 26 mmHg.  Grade 2/6 systolic murmur.  Continue optimal BP and heart rate control.  5. HLD - 08/2019 LDL 47.  Continue atorvastatin 40 mg daily.  6. HTN -BP elevated.  We have had to discontinue amlodipine and metoprolol due to significant bradycardia, relative hypotension.  Increase lisinopril to 30 mg daily.  He has upcoming lab work with hematology 05/19/2020 including CMP and will review renal function.   Disposition: Follow up in 6 week(s) with Dr. [name] or APP  [name], NP 04/30/2020, 11:14 AM

## 2020-05-13 ENCOUNTER — Telehealth: Payer: Self-pay | Admitting: Cardiovascular Disease

## 2020-05-13 DIAGNOSIS — I4821 Permanent atrial fibrillation: Secondary | ICD-10-CM | POA: Diagnosis not present

## 2020-05-13 NOTE — Telephone Encounter (Signed)
[place] with [employer] called with critical results on pt's 7 day heart monitor. Results taken at 14:36 A-fib x7 days 100% burden HR range 31-134 bpm Average HR 62 bpm  Verbalized results to [name], NP will route to her for further review as requested, NP has print out of report that [facility] faxed during call

## 2020-05-13 NOTE — Telephone Encounter (Signed)
[facility] calling with critical result Transferred to [facility]

## 2020-05-13 NOTE — Telephone Encounter (Signed)
Reviewed monitor report.  He was seen by Dr. [name] in clinic with HR 39-42 bpm and Amlodipine and Metoprolol were discontinued. Follow up 04/30/20 with me with HR routinely in the 60s at home. Monitor was placed to rule out high degree AV block or significant pause.   Monitor shows 100% atrial fibrillation burden with HR 31-145 bpm with average heart rate 62bpm. No significant pause no high grade AV block noted on my review nor preliminary report, no indication for PPM at this time. He has upcoming follow up with Dr. [name] next month. Will CC Dr. [name] as Juluis Rainier as he will also be the one to formally review the monitor. Will defer EP referral at this time.  [name], NP

## 2020-05-15 ENCOUNTER — Other Ambulatory Visit: Payer: Self-pay

## 2020-05-15 DIAGNOSIS — D696 Thrombocytopenia, unspecified: Secondary | ICD-10-CM

## 2020-05-15 DIAGNOSIS — D509 Iron deficiency anemia, unspecified: Secondary | ICD-10-CM

## 2020-05-18 NOTE — Progress Notes (Signed)
[facility]  [address], [address] [place], [place] [address] Phone: [phone]  Fax: [phone]   Clinic Day:  05/19/2020    Referring physician: [name], MD  Chief Complaint: Bryan [name] is a 79 y.o. male with arthritis, iron deficiency anemia, and mild pancytopenia who is seen for 3 month assessment.  HPI:  The patient was last seen in the hematology clinic on 02/12/2020. At that time, he felt "ok".  He was breathing better.  He was voiding well on Lasix.  Exam was back to baseline. Hematocrit was 33.7, hemoglobin 11.0, MCV 96.0, platelets 109,000, WBC 3,700 (ANC 2,200). Reticulocyte count was 2.1%. CRP was 0.6. Ferritin was 205. He continued Plaquenil.   The patient established care with Dr. Melrose [name] on 03/13/2020 for memory loss. He was prescribed Aricept 5 mg nightly. Follow up was planned for 3 months.  The patient saw Dr. Rockey [name] on 04/23/2020. Exam revealed bradycardia; heart rate ranged from 39-42. He was to hold Metoprolol and amlodipine. Follow-up EKG on 04/30/2020 revealed rate controlled atrial fibrillation (rate 57 bpm) with an incomplete LBBB.   The patient saw Dr. Nehemiah [name] on 04/28/2020. Left forehead biopsy revealed basal cell carcinoma, nodular pattern. He may be undergoing Mohs surgery in 05/2020.  The patient saw Bryan Montana, NP on 04/30/2020. His bradycardia had resolved. A 7-day heart monitor was placed to rule out significant bradycardia or high degree AV block.  Aspirin was discontinued. His BP was elevated at 160/90 so he was to increase lisinopril to 30 mg daily. Follow up was planned for 6 weeks.   During the interim, he has felt good. He is eating well. He describes red spots on his skin due to Eliquis. The spot on his forehead that was biopsied bled for several days. He has not been driving due to knee pain and neuropathy. He uses a cane or walker to ambulate. He got a cortisone injection in his knee at [facility].  The patient weighs himself everyday and takes Lasix if his weight is over 170 lbs. He takes Lasix about once a week. He watches his salt intake.  The patient is still taking Plaquenil 3x per week. He cannot tell a big difference in his memory since starting Aricept. He has trouble remembering last names.  He checked his BP on 05/16/2020 at home and the diastolic pressure was 90. He feels like he may need to increase his blood pressure medication.   Past Medical History:  Diagnosis Date  . Abnormal CT scan    Asymmetric left rectal wall thickening   . Anemia   . Anxiety   . Arrhythmia   . Basal cell carcinoma 04/28/2020   L forehead   . CHF (congestive heart failure) ([place])   . Chronic lower back pain   . Complication of anesthesia    Memory loss 09/2015  . Coronary artery disease   . Depression   . GERD (gastroesophageal reflux disease)   . Glaucoma   . H/O thoracic aortic aneurysm repair   . Headache   . Heart murmur   . History of being hospitalized    memory lose kidney funtion down blood pressure up  . History of blood transfusion    "think he had one when he had heart valve OR" (10/14/2016); "none since" (10/19/2017)  . History of chicken pox   . Hyperlipidemia   . Hypertension   . Lumbar stenosis   . Murmur   . Neuropathy   .  Osteoarthritis    worse in feet and ankles  . Paroxysmal A-fib ([place])   . Poor short term memory    takes Aricept  . Rheumatoid arthritis ([place])    "all over" (10/14/2016)  . Schizophrenia ([place])   . Seasonal allergies   . Thoracic spine pain   . Valvular heart disease   . Wears glasses    reading    Past Surgical History:  Procedure Laterality Date  . AORTIC VALVE REPLACEMENT  2007   [facility]  [place], [place]; "pig valve"  . APPENDECTOMY  2010  . BACK SURGERY    . CARDIAC VALVE REPLACEMENT    . CATARACT EXTRACTION W/ INTRAOCULAR LENS  IMPLANT, BILATERAL Bilateral 2012  . COLONOSCOPY WITH PROPOFOL N/A 09/24/2016   Procedure:  COLONOSCOPY WITH PROPOFOL;  Surgeon: Bryan Bellows, MD;  Location: [facility] ENDOSCOPY;  Service: Endoscopy;  Laterality: N/A;  . ESOPHAGOGASTRODUODENOSCOPY (EGD) WITH PROPOFOL N/A 09/24/2016   Procedure: ESOPHAGOGASTRODUODENOSCOPY (EGD) WITH PROPOFOL;  Surgeon: Bryan Bellows, MD;  Location: [facility] ENDOSCOPY;  Service: Endoscopy;  Laterality: N/A;  . GIVENS CAPSULE STUDY N/A 09/24/2016   Procedure: GIVENS CAPSULE STUDY;  Surgeon: Bryan Bellows, MD;  Location: [facility] ENDOSCOPY;  Service: Endoscopy;  Laterality: N/A;  . HAMMER TOE SURGERY Right 10/09/2015   Procedure: HAMMER TOE REPAIR WITH K-WIRE FIXATION RIGHT SECOND TOE;  Surgeon: Bryan [name], DPM;  Location: [place];  Service: Podiatry;  Laterality: Right;  WITH LOCAL  . HARDWARE REMOVAL N/A 11/02/2019   Procedure: Removal of hardware thoracic spine;  Surgeon: Bryan Kos, MD;  Location: [place];  Service: Neurosurgery;  Laterality: N/A;  posterior  . INGUINAL HERNIA REPAIR Right 05/05/2018   Medium Bard PerFix plug.  Surgeon: Bryan Bellow, MD;  Location: [facility] ORS;  Service: General;  Laterality: Right;  . IR VERTEBROPLASTY CERV/THOR BX INC UNI/BIL INC/INJECT/IMAGING  10/27/2018  . JOINT REPLACEMENT Left    left total knee  . LAMINECTOMY WITH POSTERIOR LATERAL ARTHRODESIS LEVEL 3 N/A 09/28/2017   Procedure: LUMBAR  POSTEROR  FUSION REVISION - LUMBAR ONE -TWO, LUMBAR TWO -THREE,THREE-FOUR ,BILATERAL ARTHRODESIS REMOVAL LUMBAR THREE HARDWARE;  Surgeon: Bryan Kos, MD;  Location: [place];  Service: Neurosurgery;  Laterality: N/A;  . LAMINECTOMY WITH POSTERIOR LATERAL ARTHRODESIS LEVEL 3 N/A 10/20/2017   Procedure: Revision fusion and removal of hardware Lumbar one, Pedicle screw fixation thoracic ten-lumbar two, thoracic nine-ten laminotomy, Posterior Lumbar Arthrodesis thoracic ten-lumbar two ;  Surgeon: Bryan Kos, MD;  Location: [place];  Service: Neurosurgery;  Laterality: N/A;  . LAPAROSCOPIC CHOLECYSTECTOMY  2010  . LUMBAR FUSION Right 06/08/2017    LUMBAR THREE-FOUR, LUMBAR FOUR-FIVE POSTEROLATERAL ARTHRODESIS WITH RIGHT LUMBAR FOUR-FIVE LAMINECTOMY/FORAMINOTOMY  . LUMBAR LAMINECTOMY/DECOMPRESSION MICRODISCECTOMY Right 03/08/2016   Procedure: Laminectomy and Foraminotomy - Lumbar Five-Sacral One Right;  Surgeon: Bryan Kos, MD;  Location: [place];  Service: Neurosurgery;  Laterality: Right;  Right  . MULTIPLE TOOTH EXTRACTIONS     "3"  . POSTERIOR LUMBAR FUSION  10/14/2016   L5-S1  . REPLACEMENT TOTAL KNEE Left 2003  . THORACIC AORTIC ANEURYSM REPAIR  2010    Family History  Problem Relation Age of Onset  . Heart failure Mother   . Hypertension Mother   . Asthma Mother   . Osteoporosis Mother   . Heart attack Father 41       MI  . Hypertension Sister   . Prostate cancer Neg Hx   . Bladder Cancer Neg Hx   . Kidney cancer Neg Hx   .  Colon cancer Neg Hx     Social History:  reports that he quit smoking about 38 years ago. His smoking use included cigarettes. He has a 32.00 pack-year smoking history. He has never used smokeless tobacco. He reports current alcohol use. He reports that he does not use drugs. He smoked 1 pack a day x 12 years. He stopped smoking 30 years ago. He drinks a glass of wine occasionally. He is a Software engineer. [place] His wife passed away in 07-17-18. The patient is alone today.   Allergies:  Allergies  Allergen Reactions  . Penicillins Hives, Swelling and Other (See Comments)    SWELLING REACTION UNSPECIFIED PATIENT HAS TAKEN AMOXICILLIN ON MED HX FROM [facility] PATIENT HAS HAD A PCN REACTION WITH IMMEDIATE RASH, FACIAL/TONGUE/THROAT SWELLING, SOB, OR LIGHTHEADEDNESS WITH HYPOTENSION:  #  #  #  YES  #  #  #   Has patient had a PCN reaction causing severe rash involving mucus membranes or skin necrosis: No Has patient had a PCN reaction that required hospitalization No Has patient had a PCN reaction occurring within the last 10 years: No  . Demerol  [Meperidine] Hives and Nausea And Vomiting    Current Medications: Current Outpatient Medications  Medication Sig Dispense Refill  . atorvastatin (LIPITOR) 40 MG tablet TAKE ONE TABLET BY MOUTH EVERY DAY 90 tablet 3  . calcium carbonate (OS-CAL - DOSED IN MG OF ELEMENTAL CALCIUM) 1250 (500 Ca) MG tablet Take 1 tablet by mouth daily with lunch.    . Carboxymethylcellul-Glycerin (LUBRICATING EYE DROPS OP) Place 1 drop into both eyes daily as needed (dry eyes).    . cetirizine (ZYRTEC) 10 MG tablet Take 10 mg by mouth daily.    Marland Kitchen donepezil (ARICEPT) 5 MG tablet SMARTSIG:1 Tablet(s) By Mouth Every Evening    . ELIQUIS 5 MG TABS tablet TAKE 1 TABLET BY MOUTH TWICE DAILY 180 tablet 2  . fenofibrate 160 MG tablet TAKE ONE TABLET AT BEDTIME 90 tablet 0  . FLUoxetine (PROZAC) 20 MG capsule TAKE 1 CAPSULE BY MOUTH ONCE DAILY 90 capsule 1  . folic acid (FOLVITE) 1 MG tablet Take 1 mg by mouth in the morning and at bedtime.     . furosemide (LASIX) 40 MG tablet Take 1 tablet (40 mg total) by mouth as directed. Take twice a day for weight 165 or higher. Weight less than 165 take Once daily 180 tablet 3  . gabapentin (NEURONTIN) 300 MG capsule TAKE 1 CAPSULE BY MOUTH 3 TIMES DAILY. 270 capsule 1  . HYDROcodone-acetaminophen (NORCO) 10-325 MG tablet Take 1 tablet by mouth 4 (four) times daily as needed for moderate pain.     . hydrocortisone cream 1 % Apply 1 application topically daily as needed for itching.    . hydroxychloroquine (PLAQUENIL) 200 MG tablet Take 200 mg by mouth 3 (three) times a week. Tue, Thurs, and Sat    . lisinopril (ZESTRIL) 20 MG tablet Take 1.5 tablets (30 mg total) by mouth every evening. 135 tablet 1  . Multiple Vitamins-Minerals (PRESERVISION AREDS 2) CAPS Take 1 capsule by mouth 2 (two) times daily.    Marland Kitchen testosterone cypionate (DEPOTESTOSTERONE CYPIONATE) 200 MG/ML injection Inject 0.25 cc every 2 weeks 10 mL 0  . traZODone (DESYREL) 50 MG tablet TAKE 1 TABLET AT BEDTIME MAY TAKE  2 TABLETS AT BEDTIME IF NEEDED 90 tablet 1  . vitamin B-12 (CYANOCOBALAMIN) 1000 MCG tablet Take 1,000 mcg by  mouth daily with lunch.     . Vitamin D, Ergocalciferol, (DRISDOL) 1.25 MG (50000 UNIT) CAPS capsule Take 50,000 Units by mouth once a week.    . vitamin E 180 MG (400 UNITS) capsule Take 800 Units by mouth daily with lunch.     No current facility-administered medications for this visit.   Facility-Administered Medications Ordered in Other Visits  Medication Dose Route Frequency Provider Last Rate Last Admin  . 0.9 %  sodium chloride infusion   Intravenous Continuous Lequita Asal, MD 10 mL/hr at 12/11/19 1123 New Bag at 12/11/19 1123    Review of Systems  Constitutional: Positive for weight loss (11 lbs). Negative for chills, diaphoresis, fever and malaise/fatigue.  HENT: Negative.  Negative for congestion, ear discharge, ear pain, hearing loss, nosebleeds, sinus pain, sore throat and tinnitus.   Eyes: Negative.  Negative for blurred vision, double vision and photophobia.  Respiratory: Negative for cough, hemoptysis, sputum production and shortness of breath.   Cardiovascular: Negative.  Negative for chest pain, palpitations, orthopnea and leg swelling.  Gastrointestinal: Negative.  Negative for abdominal pain, blood in stool, constipation, diarrhea, heartburn, melena, nausea and vomiting.       Eating well.  Genitourinary: Negative.  Negative for dysuria, frequency, hematuria and urgency.  Musculoskeletal: Positive for joint pain (rheumatoid arthritis, right knee). Negative for back pain, myalgias and neck pain.  Skin: Negative for itching and rash.       Basal cell carcinoma on forehead  Neurological: Positive for sensory change (neuropathy, on Neurontin). Negative for dizziness, tingling, weakness and headaches.  Endo/Heme/Allergies: Bruises/bleeds easily (red spots on skin due to Eliquis).  Psychiatric/Behavioral: Positive for depression (lost his wife in 06/2018) and  memory loss (on aricept). Negative for substance abuse. The patient is not nervous/anxious and does not have insomnia.   All other systems reviewed and are negative.  Performance status (ECOG):  1-2  Vitals Blood pressure (!) 166/88, temperature 98.2 F (36.8 C), temperature source Tympanic, resp. rate 18, weight 167 lb 3.5 oz (75.8 kg), SpO2 95 %.   Physical Exam Vitals and nursing note reviewed.  Constitutional:      General: He is not in acute distress.    Appearance: He is well-developed. He is not diaphoretic.     Comments: He has a cane by his side.  HENT:     Head: Normocephalic and atraumatic.     Comments: White hair and beard.  Forehead band-aid.    Mouth/Throat:     Mouth: Mucous membranes are dry. Injury (bit inner R cheek) present.     Pharynx: Oropharynx is clear. No oropharyngeal exudate.  Eyes:     General: No scleral icterus.    Extraocular Movements: Extraocular movements intact.     Conjunctiva/sclera: Conjunctivae normal.     Pupils: Pupils are equal, round, and reactive to light.     Comments: Glasses.  Hazel eyes.  Neck:     Comments: Prominent neck veins. Cardiovascular:     Rate and Rhythm: Normal rate and regular rhythm.     Heart sounds: Normal heart sounds. No murmur heard.   Pulmonary:     Effort: No respiratory distress.     Breath sounds: No wheezing or rales.  Chest:  Breasts:     Right: No axillary adenopathy or supraclavicular adenopathy.     Left: No axillary adenopathy or supraclavicular adenopathy.    Abdominal:     General: Bowel sounds are normal. There is no distension.  Palpations: Abdomen is soft. There is no mass.     Tenderness: There is no abdominal tenderness. There is no guarding or rebound.  Musculoskeletal:        General: No swelling or tenderness. Normal range of motion.     Cervical back: Normal range of motion.     Right lower leg: No edema.     Left lower leg: No edema.  Lymphadenopathy:     Head:     Right  side of head: No preauricular, posterior auricular or occipital adenopathy.     Left side of head: No preauricular, posterior auricular or occipital adenopathy.     Cervical: No cervical adenopathy.     Upper Body:     Right upper body: No supraclavicular or axillary adenopathy.     Left upper body: No supraclavicular or axillary adenopathy.     Lower Body: No right inguinal adenopathy. No left inguinal adenopathy.  Skin:    General: Skin is warm and dry.     Coloration: Skin is not pale.     Findings: No erythema or rash.  Neurological:     Mental Status: He is alert and oriented to person, place, and time. Mental status is at baseline.  Psychiatric:        Mood and Affect: Mood normal.        Behavior: Behavior normal.        Thought Content: Thought content normal.        Judgment: Judgment normal.    Appointment on 05/19/2020  Component Date Value Ref Range Status  . WBC 05/19/2020 3.0* 4.0 - 10.5 K/uL Final  . RBC 05/19/2020 3.72* 4.22 - 5.81 MIL/uL Final  . Hemoglobin 05/19/2020 11.7* 13.0 - 17.0 g/dL Final  . HCT 05/19/2020 35.4* 39.0 - 52.0 % Final  . MCV 05/19/2020 95.2  80.0 - 100.0 fL Final  . MCH 05/19/2020 31.5  26.0 - 34.0 pg Final  . MCHC 05/19/2020 33.1  30.0 - 36.0 g/dL Final  . RDW 05/19/2020 15.5  11.5 - 15.5 % Final  . Platelets 05/19/2020 109* 150 - 400 K/uL Final   Comment: Immature Platelet Fraction may be clinically indicated, consider ordering this additional test [id number]   . nRBC 05/19/2020 0.0  0.0 - 0.2 % Final  . Neutrophils Relative % 05/19/2020 63  % Final  . Neutro Abs 05/19/2020 1.9  1.7 - 7.7 K/uL Final  . Lymphocytes Relative 05/19/2020 22  % Final  . Lymphs Abs 05/19/2020 0.6* 0.7 - 4.0 K/uL Final  . Monocytes Relative 05/19/2020 11  % Final  . Monocytes Absolute 05/19/2020 0.3  0.1 - 1.0 K/uL Final  . Eosinophils Relative 05/19/2020 2  % Final  . Eosinophils Absolute 05/19/2020 0.1  0.0 - 0.5 K/uL Final  . Basophils Relative  05/19/2020 1  % Final  . Basophils Absolute 05/19/2020 0.0  0.0 - 0.1 K/uL Final  . Immature Granulocytes 05/19/2020 1  % Final  . Abs Immature Granulocytes 05/19/2020 0.02  0.00 - 0.07 K/uL Final   Performed at [facility], [address] [place], [place] [address]  . Sodium 05/19/2020 136  135 - 145 mmol/L Final  . Potassium 05/19/2020 3.8  3.5 - 5.1 mmol/L Final  . Chloride 05/19/2020 100  98 - 111 mmol/L Final  . CO2 05/19/2020 27  22 - 32 mmol/L Final  . Glucose, Bld 05/19/2020 122* 70 - 99 mg/dL Final   Glucose reference range applies only to samples  taken after fasting for at least 8 hours.  . BUN 05/19/2020 21  8 - 23 mg/dL Final  . Creatinine, Ser 05/19/2020 1.06  0.61 - 1.24 mg/dL Final  . Calcium 80/88/1103 8.9  8.9 - 10.3 mg/dL Final  . Total Protein 05/19/2020 6.8  6.5 - 8.1 g/dL Final  . Albumin 15/94/5859 3.8  3.5 - 5.0 g/dL Final  . AST 29/24/4628 27  15 - 41 U/L Final  . ALT 05/19/2020 20  0 - 44 U/L Final  . Alkaline Phosphatase 05/19/2020 60  38 - 126 U/L Final  . Total Bilirubin 05/19/2020 0.7  0.3 - 1.2 mg/dL Final  . GFR, Estimated 05/19/2020 >60  >60 mL/min Final   Comment: (NOTE) Calculated using the CKD-EPI Creatinine Equation (2021)   . Anion gap 05/19/2020 9  5 - 15 Final   Performed at [facility], [address] [place], [place] [address]  . LDH 05/19/2020 197* 98 - 192 U/L Final   Performed at [facility], [address] [place], [place] [address]    Assessment:  Bryan [name] is a 79 y.o. male withrheumatoid arthritisand progressive anemiaover the past 2 years. He has been on Plaquenil and hydralazinewhich can cause anemia. Plaquenil was discontinued on 08/26/2016. He denies any exposure to radiation or toxins. He denies any prior history of hepatitis, prior transfusions or HIV risk factors. He denies any herbal products.  Diet is good. EGDon 09/24/2016 revealed patchy candidiasis in the  entire esophagus. There were two non-bleeding angioectasias in the duodenum treated with argon plasma coagulation. Gastritis was biopsied. Pathology revealed mild chronic gastritis negative for H pylori, dysplasia and malignancy. Colonoscopyon 09/24/2016 revealed diverticulosis in the entire colon, one 3 mm polyp in the cecum, and non-bleeding internal hemorrhoids. Pathology from the cecal polyp revealed a tubular adenoma negative for high grade dysplasia and malignancy.   He denies any melena or hematochezia. He denies any hematuria.  CBC on 01/10/2018revealed a hematocrit of 29.5, hemoglobin 10.0, MCV 92.3, platelets 155,000, and WBC 4200. Stool was guaiac negative x 2 in 06/2016. Labs on 01/12/2018revealed a ferritin of 124, iron saturation 22% and TIBC of 370. B12 was 883 on 06/18/2014.  Work-up on 02/26/2018revealed a hematocrit of 31.7, hemoglobin 10.6, MCV 91, platelets 143,000, white count 3000 with an ANC of 1800. Absolute lymphocyte count was 700 (low). Creatinine was 1.57. LDHwas 269 (98-192). Normal labsincluded: uric acid, folate, [place], SPEP, and copper. Iron studiesincluded a saturation of 11% (low) and a TIBC of 454 (high) c/wiron deficiency anemia. Sed rate was 19. Retic was 0.9% (low). He is on oral ironwith vitamin C.  Chest, abdomen, and pelvic CTon 08/09/2016 revealed no acute findings or clear evidence of malignancy in the chest, abdomen or pelvis. There was asymmetric left rectal wall thickeningpossibly secondary to volume averaging with the prostate gland.  Bone marrow aspirate and biopsyon 11/04/2016 revealed a normocellular marrow (20%) with trilineage hematopoiesis and maturation. There was mild megakaryocytic atypia. Storage iron was present. There were rare ringed sideroblasts. Cytogeneticsand FISHwere normal (46, XY).  He was admitted to [facility] 10/14/2016 - 10/18/2016 for spinal stenosis of the lumbar region. He underwent  redo decompressive lumbar laminotomy L5-S1 with radical foraminotomy the L5 and S1 nerve root with complete medial facetectomies. He underwentback surgery(revision fusion removal hardware and pedicle screw fixation) on 10/20/2017.  [facility] with symptomatic anemia. He received 2 units of PRBCs. Hemoglobin improved from 6.2 to 8.1.  Hospital work-up revealed the following normal labs:ferritin (65), B12 (401), TSH, SPEP, haptoglobin. Testosteronewas <3 (low). Iron saturation was 7% with a TIBC of 317. LDH was 205 (98-192). Retic was 2.6%. Creatinine was 1.37 (CrCl 47.3 ml/min).  Peripheral smear revealedleukopenia with normal WBC morphology. Normocytic anemia with polychromasia, anisocytosis and rouleaux formation.   Work-up on 11/08/2017 revealed a hematocrit of 27.2, hemoglobin 9.0, platelets 200,000, WBC 5800 with an [place] of 4100. Ferritin was 86. Iron saturation was 6% with a TIBC of 382. Sed rate was 56 (0-20). Cold agglutinins were negative. Reticulocyte count was 1.2%. SPEP revealed no monoclonal protein on 10/31/2017.  He received weekly Venoferx 2 (11/25/2017 - 12/02/2017), x 2 (12/23/2017 - 12/30/2017), and x 3 (12/11/2019 - 12/25/2019), and x 2 (01/22/2020 - 01/29/2020).  Ferritinhas been followed: 106 on 06/24/2017, 65 on 10/30/2017, 86 on 11/08/2017, 124 on 12/12/2017, 106 on 12/15/2017, 95 on 01/20/2018, 133 on 03/10/2018, 90 on 12/19/2018, 138 on 06/20/2019, 180 on 11/22/2019, 106 on 12/05/2019, 125 on 01/14/2020, and 205 on 02/12/2020. Iron saturationwas 6% on 06/24/2017, 6% on 01/20/2018, 11% on 12/05/2019 and 14% on 01/14/2020. Sed rate was 56 on 11/08/2017, 64 on 12/05/2017, and 40 on 11/22/2019.  He has a neuropathic ulcerof the right great toe. He has treated with Septra then switched to doxycycline. He underwent back surgeryon 10/20/2017.  The patient was admitted to [facility] from 11/12/2019-11/14/2019 for  hemoptysis, pulmonary edema, and acute hypoxemic respiratory failure. CXR revealed symmetric bilateral airspace disease be secondary to pulmonary edema or pneumonia.   The patient received the [place] COVID-19 vaccines in 05/2019.  Symptomatically, he feels good. He is eating well. He has minor bruising on Eliquis.  He has not been driving due to knee pain and neuropathy. He uses a cane or walker to ambulate.  Plan: 1.   Labs today: CBC with diff, CMP, ferritin, iron studies, sed rate, retic, LDH, G6PD assay. 2.  Iron deficiency anemia  Hematocrit 33.5.  Hemoglobin 11.1.  MCV 93.1 on 03/25/2020.  Hematocrit 35.4.  Hemoglobin 11.7.  MCV 95.2 on 05/19/2020.   Ferritin 135 with an iron saturation of 19% and a TIBC of 374. Retic 3.0% (relatively low) and LDH 306 on 11/22/2019.   B12 was 482 and folate 76 on 12/05/2019.    Coombs and haptoglobin were negative/normal on 12/05/2019.    TSH was 1.476 on 12/05/2019.  Patient previously declined bone marrow aspirate and biopsy.  Continue to monitor.. 3. Thrombocytopenia Platelet count  109,000 on 05/19/2020. He is on no new medications or herbal products.               Patient on Plaquenil.             Continue to monitor. 4.   Leukopenia  WBC 3000 with an [place] of 1900 on 05/19/2020.  Patient on Plaquenil.  Plan to readdress bone marrow aspirate and biopsy if counts decline. 5.   RN:  Call with iron studies. 6.   RTC in 3 months for MD assessment, labs (CBC with diff, CMP, ferritin, iron studies, retic).   I discussed the assessment and treatment plan with the patient.  The patient was provided an opportunity to ask questions and all were answered.  The patient agreed with the plan and demonstrated an understanding of the instructions.  The patient was advised to call back if the symptoms worsen or if the condition fails to improve as anticipated.   Lequita Asal, MD, PhD    05/19/2020, .1:20 PM  London Sheer Tufford, am acting as a Education administrator for [employer]. [name], MD.   I, Marsi Turvey C. [name], MD, have reviewed the above documentation for accuracy and completeness, and I agree with the above.

## 2020-05-19 ENCOUNTER — Inpatient Hospital Stay: Payer: Medicare Other | Attending: Hematology and Oncology

## 2020-05-19 ENCOUNTER — Encounter: Payer: Self-pay | Admitting: Hematology and Oncology

## 2020-05-19 ENCOUNTER — Other Ambulatory Visit: Payer: Self-pay

## 2020-05-19 ENCOUNTER — Other Ambulatory Visit: Payer: Self-pay | Admitting: Hematology and Oncology

## 2020-05-19 ENCOUNTER — Telehealth: Payer: Self-pay | Admitting: Hematology and Oncology

## 2020-05-19 ENCOUNTER — Inpatient Hospital Stay (HOSPITAL_BASED_OUTPATIENT_CLINIC_OR_DEPARTMENT_OTHER): Payer: Medicare Other | Admitting: Hematology and Oncology

## 2020-05-19 VITALS — BP 166/88 | Temp 98.2°F | Resp 18 | Wt 167.2 lb

## 2020-05-19 DIAGNOSIS — D649 Anemia, unspecified: Secondary | ICD-10-CM

## 2020-05-19 DIAGNOSIS — D696 Thrombocytopenia, unspecified: Secondary | ICD-10-CM

## 2020-05-19 DIAGNOSIS — D509 Iron deficiency anemia, unspecified: Secondary | ICD-10-CM

## 2020-05-19 DIAGNOSIS — D72819 Decreased white blood cell count, unspecified: Secondary | ICD-10-CM

## 2020-05-19 DIAGNOSIS — I6523 Occlusion and stenosis of bilateral carotid arteries: Secondary | ICD-10-CM

## 2020-05-19 LAB — COMPREHENSIVE METABOLIC PANEL
ALT: 20 U/L (ref 0–44)
AST: 27 U/L (ref 15–41)
Albumin: 3.8 g/dL (ref 3.5–5.0)
Alkaline Phosphatase: 60 U/L (ref 38–126)
Anion gap: 9 (ref 5–15)
BUN: 21 mg/dL (ref 8–23)
CO2: 27 mmol/L (ref 22–32)
Calcium: 8.9 mg/dL (ref 8.9–10.3)
Chloride: 100 mmol/L (ref 98–111)
Creatinine, Ser: 1.06 mg/dL (ref 0.61–1.24)
GFR, Estimated: >60 mL/min (ref >60)
Glucose, Bld: 122 mg/dL — ABNORMAL HIGH (ref 70–99)
Potassium: 3.8 mmol/L (ref 3.5–5.1)
Sodium: 136 mmol/L (ref 135–145)
Total Bilirubin: 0.7 mg/dL (ref 0.3–1.2)
Total Protein: 6.8 g/dL (ref 6.5–8.1)

## 2020-05-19 LAB — IRON AND TIBC
Iron: 70 ug/dL (ref 45–182)
Saturation Ratios: 19 % (ref 17.9–39.5)
TIBC: 374 ug/dL (ref 250–450)
UIBC: 304 ug/dL

## 2020-05-19 LAB — CBC WITH DIFFERENTIAL/PLATELET
Abs Immature Granulocytes: 0.02 10*3/uL (ref 0.00–0.07)
Basophils Absolute: 0 10*3/uL (ref 0.0–0.1)
Basophils Relative: 1 %
Eosinophils Absolute: 0.1 10*3/uL (ref 0.0–0.5)
Eosinophils Relative: 2 %
HCT: 35.4 % — ABNORMAL LOW (ref 39.0–52.0)
Hemoglobin: 11.7 g/dL — ABNORMAL LOW (ref 13.0–17.0)
Immature Granulocytes: 1 %
Lymphocytes Relative: 22 %
Lymphs Abs: 0.6 10*3/uL — ABNORMAL LOW (ref 0.7–4.0)
MCH: 31.5 pg (ref 26.0–34.0)
MCHC: 33.1 g/dL (ref 30.0–36.0)
MCV: 95.2 fL (ref 80.0–100.0)
Monocytes Absolute: 0.3 10*3/uL (ref 0.1–1.0)
Monocytes Relative: 11 %
Neutro Abs: 1.9 10*3/uL (ref 1.7–7.7)
Neutrophils Relative %: 63 %
Platelets: 109 10*3/uL — ABNORMAL LOW (ref 150–400)
RBC: 3.72 MIL/uL — ABNORMAL LOW (ref 4.22–5.81)
RDW: 15.5 % (ref 11.5–15.5)
WBC: 3 10*3/uL — ABNORMAL LOW (ref 4.0–10.5)
nRBC: 0 % (ref 0.0–0.2)

## 2020-05-19 LAB — SEDIMENTATION RATE: Sed Rate: 10 mm/hr (ref 0–20)

## 2020-05-19 LAB — RETICULOCYTES
Immature Retic Fract: 19.3 % — ABNORMAL HIGH (ref 2.3–15.9)
RBC.: 3.61 MIL/uL — ABNORMAL LOW (ref 4.22–5.81)
Retic Count, Absolute: 78.3 10*3/uL (ref 19.0–186.0)
Retic Ct Pct: 2.2 % (ref 0.4–3.1)

## 2020-05-19 LAB — FERRITIN: Ferritin: 135 ng/mL (ref 24–336)

## 2020-05-19 LAB — LACTATE DEHYDROGENASE: LDH: 197 U/L — ABNORMAL HIGH (ref 98–192)

## 2020-05-20 LAB — GLUCOSE 6 PHOSPHATE DEHYDROGENASE
G6PDH: 9.6 U/g{Hb} (ref 4.8–15.7)
Hemoglobin: 11 g/dL — ABNORMAL LOW (ref 13.0–17.7)

## 2020-05-21 ENCOUNTER — Other Ambulatory Visit: Payer: Self-pay

## 2020-05-26 ENCOUNTER — Other Ambulatory Visit: Payer: Self-pay

## 2020-05-26 ENCOUNTER — Encounter: Payer: Self-pay | Admitting: Family Medicine

## 2020-05-26 ENCOUNTER — Telehealth (INDEPENDENT_AMBULATORY_CARE_PROVIDER_SITE_OTHER): Payer: Medicare HMO | Admitting: Family Medicine

## 2020-05-26 DIAGNOSIS — G8929 Other chronic pain: Secondary | ICD-10-CM | POA: Diagnosis not present

## 2020-05-26 DIAGNOSIS — C44319 Basal cell carcinoma of skin of other parts of face: Secondary | ICD-10-CM | POA: Diagnosis not present

## 2020-05-26 DIAGNOSIS — I10 Essential (primary) hypertension: Secondary | ICD-10-CM | POA: Diagnosis not present

## 2020-05-26 DIAGNOSIS — M25561 Pain in right knee: Secondary | ICD-10-CM | POA: Insufficient documentation

## 2020-05-26 DIAGNOSIS — C4491 Basal cell carcinoma of skin, unspecified: Secondary | ICD-10-CM | POA: Insufficient documentation

## 2020-05-26 DIAGNOSIS — I480 Paroxysmal atrial fibrillation: Secondary | ICD-10-CM | POA: Diagnosis not present

## 2020-05-26 NOTE — Progress Notes (Signed)
Virtual Visit via telephone Note  This visit type was conducted due to national recommendations for restrictions regarding the COVID-19 pandemic (e.g. social distancing).  This format is felt to be most appropriate for this patient at this time.  All issues noted in this document were discussed and addressed.  No physical exam was performed (except for noted visual exam findings with Video Visits).   I connected with Bryan [name] today at  1:15 PM EST by a video enabled telemedicine application or telephone and verified that I am speaking with the correct person using two identifiers. Location patient: home Location provider: work Persons participating in the virtual visit: patient, provider  I discussed the limitations, risks, security and privacy concerns of performing an evaluation and management service by telephone and the availability of in person appointments. I also discussed with the patient that there may be a patient responsible charge related to this service. The patient expressed understanding and agreed to proceed.  Interactive audio and video telecommunications were attempted between this provider and patient, however failed, due to patient having technical difficulties OR patient did not have access to video capability.  We continued and completed visit with audio only.   Reason for visit: f/u  HPI: HYPERTENSION  Disease Monitoring  Home BP Monitoring 140/70 Chest pain- no    Dyspnea- no Medications  Compliance-  Taking lisinopril 30 mg daily.  Edema- no  Afib: Taking Eliquis 5 mg twice daily.  No bleeding issues.  No palpitations.  His metoprolol was discontinued by cardiology given bradycardia.  Right knee pain: Patient saw orthopedics several weeks ago and had a steroid injection.  Notes that was not terribly beneficial.  He follows up with them again tomorrow to discuss Synvisc versus knee replacement.  Basal cell carcinoma: This is on his forehead.  He saw  dermatology for this.  He has follow-up with them to fully remove the area.   ROS: See pertinent positives and negatives per HPI.  Past Medical History:  Diagnosis Date  . Abnormal CT scan    Asymmetric left rectal wall thickening   . Anemia   . Anxiety   . Arrhythmia   . Basal cell carcinoma 04/28/2020   L forehead   . CHF (congestive heart failure) ([place])   . Chronic lower back pain   . Complication of anesthesia    Memory loss 09/2015  . Coronary artery disease   . Depression   . GERD (gastroesophageal reflux disease)   . Glaucoma   . H/O thoracic aortic aneurysm repair   . Headache   . Heart murmur   . History of being hospitalized    memory lose kidney funtion down blood pressure up  . History of blood transfusion    "think he had one when he had heart valve OR" (10/14/2016); "none since" (10/19/2017)  . History of chicken pox   . Hyperlipidemia   . Hypertension   . Lumbar stenosis   . Murmur   . Neuropathy   . Osteoarthritis    worse in feet and ankles  . Paroxysmal A-fib ([place])   . Poor short term memory    takes Aricept  . Rheumatoid arthritis ([place])    "all over" (10/14/2016)  . Schizophrenia ([place])   . Seasonal allergies   . Thoracic spine pain   . Valvular heart disease   . Wears glasses    reading    Past Surgical History:  Procedure Laterality Date  . AORTIC VALVE REPLACEMENT  [address] [place].  [place], [place]; "pig valve"  . APPENDECTOMY  2010  . BACK SURGERY    . CARDIAC VALVE REPLACEMENT    . CATARACT EXTRACTION W/ INTRAOCULAR LENS  IMPLANT, BILATERAL Bilateral 2012  . COLONOSCOPY WITH PROPOFOL N/A 09/24/2016   Procedure: COLONOSCOPY WITH PROPOFOL;  Surgeon: Wyline Mood, MD;  Location[facility] ENDOSCOPY;  Service: Endoscopy;  Laterality: N/A;  . ESOPHAGOGASTRODUODENOSCOPY (EGD) WITH PROPOFOL N/A 09/24/2016   Procedure: ESOPHAGOGASTRODUODENOSCOPY (EGD) WITH PROPOFOL;  Surgeon: Wyline Mood, MD;  Location: [facility] ENDOSCOPY;  Service: Endoscopy;   Laterality: N/A;  . GIVENS CAPSULE STUDY N/A 09/24/2016   Procedure: GIVENS CAPSULE STUDY;  Surgeon: Wyline Mood, MD;  Location[facility] ENDOSCOPY;  Service: Endoscopy;  Laterality: N/A;  . HAMMER TOE SURGERY Right 10/09/2015   Procedure: HAMMER TOE REPAIR WITH K-WIRE FIXATION RIGHT SECOND TOE;  Surgeon: Recardo Evangelist, DPM;  Location: [facility];  Service: Podiatry;  Laterality: Right;  WITH LOCAL  . HARDWARE REMOVAL N/A 11/02/2019   Procedure: Removal of hardware thoracic spine;  Surgeon: Donalee Citrin, MD;  Location[facility] OR;  Service: Neurosurgery;  Laterality: N/A;  posterior  . INGUINAL HERNIA REPAIR Right 05/05/2018   Medium Bard PerFix plug.  Surgeon: Earline Mayotte, MD;  Location: [facility] ORS;  Service: General;  Laterality: Right;  . IR VERTEBROPLASTY CERV/THOR BX INC UNI/BIL INC/INJECT/IMAGING  10/27/2018  . JOINT REPLACEMENT Left    left total knee  . LAMINECTOMY WITH POSTERIOR LATERAL ARTHRODESIS LEVEL 3 N/A 09/28/2017   Procedure: LUMBAR  POSTEROR  FUSION REVISION - LUMBAR ONE -TWO, LUMBAR TWO -THREE,THREE-FOUR ,BILATERAL ARTHRODESIS REMOVAL LUMBAR THREE HARDWARE;  Surgeon: Donalee Citrin, MD;  Location: [facility] OR;  Service: Neurosurgery;  Laterality: N/A;  . LAMINECTOMY WITH POSTERIOR LATERAL ARTHRODESIS LEVEL 3 N/A 10/20/2017   Procedure: Revision fusion and removal of hardware Lumbar one, Pedicle screw fixation thoracic ten-lumbar two, thoracic nine-ten laminotomy, Posterior Lumbar Arthrodesis thoracic ten-lumbar two ;  Surgeon: Donalee Citrin, MD;  Location: [facility] OR;  Service: Neurosurgery;  Laterality: N/A;  . LAPAROSCOPIC CHOLECYSTECTOMY  2010  . LUMBAR FUSION Right 06/08/2017   LUMBAR THREE-FOUR, LUMBAR FOUR-FIVE POSTEROLATERAL ARTHRODESIS WITH RIGHT LUMBAR FOUR-FIVE LAMINECTOMY/FORAMINOTOMY  . LUMBAR LAMINECTOMY/DECOMPRESSION MICRODISCECTOMY Right 03/08/2016   Procedure: Laminectomy and Foraminotomy - Lumbar Five-Sacral One Right;  Surgeon: Donalee Citrin, MD;  Location: [facility] OR;  Service:  Neurosurgery;  Laterality: Right;  Right  . MULTIPLE TOOTH EXTRACTIONS     "3"  . POSTERIOR LUMBAR FUSION  10/14/2016   L5-S1  . REPLACEMENT TOTAL KNEE Left 2003  . THORACIC AORTIC ANEURYSM REPAIR  2010    Family History  Problem Relation Age of Onset  . Heart failure Mother   . Hypertension Mother   . Asthma Mother   . Osteoporosis Mother   . Heart attack Father 40       MI  . Hypertension Sister   . Prostate cancer Neg Hx   . Bladder Cancer Neg Hx   . Kidney cancer Neg Hx   . Colon cancer Neg Hx     SOCIAL HX: Former smoker   Current Outpatient Medications:  .  atorvastatin (LIPITOR) 40 MG tablet, TAKE ONE TABLET BY MOUTH EVERY DAY, Disp: 90 tablet, Rfl: 3 .  calcium carbonate (OS-CAL - DOSED IN MG OF ELEMENTAL CALCIUM) 1250 (500 Ca) MG tablet, Take 1 tablet by mouth daily with lunch., Disp: , Rfl:  .  Carboxymethylcellul-Glycerin (LUBRICATING EYE DROPS OP), Place 1 drop into both eyes daily as needed (  dry eyes)., Disp: , Rfl:  .  cetirizine (ZYRTEC) 10 MG tablet, Take 10 mg by mouth daily., Disp: , Rfl:  .  donepezil (ARICEPT) 5 MG tablet, SMARTSIG:1 Tablet(s) By Mouth Every Evening, Disp: , Rfl:  .  ELIQUIS 5 MG TABS tablet, TAKE 1 TABLET BY MOUTH TWICE DAILY, Disp: 180 tablet, Rfl: 2 .  fenofibrate 160 MG tablet, TAKE ONE TABLET AT BEDTIME, Disp: 90 tablet, Rfl: 0 .  FLUoxetine (PROZAC) 20 MG capsule, TAKE 1 CAPSULE BY MOUTH ONCE DAILY, Disp: 90 capsule, Rfl: 1 .  folic acid (FOLVITE) 1 MG tablet, Take 1 mg by mouth in the morning and at bedtime. , Disp: , Rfl:  .  gabapentin (NEURONTIN) 300 MG capsule, TAKE 1 CAPSULE BY MOUTH 3 TIMES DAILY., Disp: 270 capsule, Rfl: 1 .  HYDROcodone-acetaminophen (NORCO) 10-325 MG tablet, Take 1 tablet by mouth 4 (four) times daily as needed for moderate pain. , Disp: , Rfl:  .  hydrocortisone cream 1 %, Apply 1 application topically daily as needed for itching., Disp: , Rfl:  .  hydroxychloroquine (PLAQUENIL) 200 MG tablet, Take 200 mg  by mouth 3 (three) times a week. Tue, Thurs, and Sat, Disp: , Rfl:  .  lisinopril (ZESTRIL) 20 MG tablet, Take 1.5 tablets (30 mg total) by mouth every evening., Disp: 135 tablet, Rfl: 1 .  Multiple Vitamins-Minerals (PRESERVISION AREDS 2) CAPS, Take 1 capsule by mouth 2 (two) times daily., Disp: , Rfl:  .  testosterone cypionate (DEPOTESTOSTERONE CYPIONATE) 200 MG/ML injection, Inject 0.25 cc every 2 weeks, Disp: 10 mL, Rfl: 0 .  traZODone (DESYREL) 50 MG tablet, TAKE 1 TABLET AT BEDTIME MAY TAKE 2 TABLETS AT BEDTIME IF NEEDED, Disp: 90 tablet, Rfl: 1 .  vitamin B-12 (CYANOCOBALAMIN) 1000 MCG tablet, Take 1,000 mcg by mouth daily with lunch. , Disp: , Rfl:  .  Vitamin D, Ergocalciferol, (DRISDOL) 1.25 MG (50000 UNIT) CAPS capsule, Take 50,000 Units by mouth once a week., Disp: , Rfl:  .  vitamin E 180 MG (400 UNITS) capsule, Take 800 Units by mouth daily with lunch., Disp: , Rfl:  .  furosemide (LASIX) 40 MG tablet, Take 1 tablet (40 mg total) by mouth as directed. Take twice a day for weight 165 or higher. Weight less than 165 take Once daily (Patient not taking: Reported on 05/26/2020), Disp: 180 tablet, Rfl: 3 No current facility-administered medications for this visit.  Facility-Administered Medications Ordered in Other Visits:  .  0.9 %  sodium chloride infusion, , Intravenous, Continuous, Corcoran, Melissa C, MD, Last Rate: 10 mL/hr at 12/11/19 1123, New Bag at 12/11/19 1123  EXAM: This is a telephone visit and thus no physical exam was completed.  ASSESSMENT AND PLAN:  Discussed the following assessment and plan:  Problem List Items Addressed This Visit    Basal cell carcinoma    He will see dermatology as planned.      Essential hypertension    Adequate control for age particularly given prior episodes of low blood pressure.  He will remain on lisinopril 30 mg daily.  He will see cardiology as scheduled later this month.      Paroxysmal atrial fibrillation (HCC)    Asymptomatic.   He will continue Eliquis.  He will continue to follow with cardiology.      Right knee pain    He will see orthopedics as planned.          I discussed the assessment and treatment plan with the patient.  The patient was provided an opportunity to ask questions and all were answered. The patient agreed with the plan and demonstrated an understanding of the instructions.   The patient was advised to call back or seek an in-person evaluation if the symptoms worsen or if the condition fails to improve as anticipated.  I provided 6 minutes of non-face-to-face time during this encounter.   [name], MD

## 2020-05-26 NOTE — Assessment & Plan Note (Signed)
Adequate control for age particularly given prior episodes of low blood pressure.  He will remain on lisinopril 30 mg daily.  He will see cardiology as scheduled later this month.

## 2020-05-26 NOTE — Assessment & Plan Note (Signed)
He will see dermatology as planned. 

## 2020-05-26 NOTE — Assessment & Plan Note (Signed)
Asymptomatic.  He will continue Eliquis.  He will continue to follow with cardiology.

## 2020-05-26 NOTE — Assessment & Plan Note (Signed)
He will see orthopedics as planned.

## 2020-06-02 ENCOUNTER — Other Ambulatory Visit: Payer: Self-pay | Admitting: Cardiovascular Disease

## 2020-06-06 ENCOUNTER — Other Ambulatory Visit: Payer: Self-pay | Admitting: Family Medicine

## 2020-06-06 ENCOUNTER — Telehealth: Payer: Self-pay | Admitting: Cardiovascular Disease

## 2020-06-06 NOTE — Telephone Encounter (Signed)
Tried to reach back out to pt, home phone no answering machine, cell phone VM is not set up

## 2020-06-06 NOTE — Telephone Encounter (Signed)
Able to reach pt, he reports this am his BP was 195/101, HR 69, he has recently been taking off amlodipine and metoprolol d/t low HR, but is still taking the lisinopril 30mg  every evening. Reports has notice trend in BP elevation in the am, did take 5mg  amlodipine this am, which reduce BP to 156/66 HR 66 this am and BP continues to decrease and HR 70 per pt. Pt denies CP, dizziness, headaches, or shob, is just worried about his BP spiking since being off the medications. Pt does have a follow-up appt with Dr. [name] next Firday. 1/21, decline to move sooner with another provider. Pt reported he will continue to take amlodipine 5mg  when hypertensive until told other wise, advise to closely monitor HR, signs of dizziness or fainting, pt verbalized understanding, Otherwise all questions or concerns were address and no additional concerns at this time, will call back for anything further.

## 2020-06-06 NOTE — Telephone Encounter (Signed)
Pt c/o BP issue: STAT if pt c/o blurred vision, one-sided weakness or slurred speech  1. What are your last 5 BP readings?   Trending up since last ov when metoprolol and amlodipine dc   1/14 am 195/101  HR 69  Took 5 mg amlodipine  Now 156/66   HR66   2. Are you having any other symptoms (ex. Dizziness, headache, blurred vision, passed out)? No just doesn't feel good   3. What is your BP issue? Trending up since last ov should he keep taking amlodipine ?

## 2020-06-09 ENCOUNTER — Telehealth: Payer: Self-pay | Admitting: *Deleted

## 2020-06-09 NOTE — Telephone Encounter (Signed)
Attempted to call pt with heart monitor results. No answer. Unable to leave voicemail. Mobile # not working. Pt has follow up appt with Dr. [name] 06/13/20. Otherwise, will try to reach pt again with results.

## 2020-06-09 NOTE — Telephone Encounter (Signed)
-----   Message from [name], NP sent at 06/06/2020  9:38 AM EST ----- ZIO monitor shows known atrial fibrillation. Heart rate range 31bpm to 134 bpm. Low heart rates noted during sleeping hours. No significant pauses noted which is a good result! No indication for pacemaker at this time. If he has not had a sleep study before, would recommend as undiagnosed sleep apnea could contribute to low heart rates.

## 2020-06-12 NOTE — Progress Notes (Signed)
Date:  06/13/2020   ID:  Bryan [name], DOB April 11, 1941, MRN [id number]  Patient Location:  [address] [place] [address]   Provider location:   [facility], [place] office  PCP:  [name], MD  Cardiologist:  Arvid Right [facility]  Chief Complaint  Patient presents with  . Follow-up    F/U after wearing ZIO monitor. Patient also reports elevated blood pressure readings for the past week. He took amlodipine that he had on hand which he feels seemed to lower his pressure but he is now out of this medication.    History of Present Illness:    Bryan [name] is a 80 y.o. 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 bicuspid aortic valve noted in 2009   cardiac catheterization showing mild to moderate CAD,  AVR with bioprosthetic valve, ascending aorta grafting in 2010,  hypertension,  hyperlipidemia,  mild chronic renal insufficiency, total knee replacement in 2004, appendix rupture and gallbladder surgery in 2010,  postoperative atrial fibrillation in 2010 pulm HTN on echo 2017 Valve with mean gradient 20 mm Hg, moderate, prosthetic valve who presents for routine follow-up of his bioprosthetic aortic valve and dilated ascending aorta , atrial fibrillation  No family presents with him today Discussed recent testing results with him Event monitor Atrial Fibrillation occurred continuously (100% burden), ranging from 31- 134 bpm (avg of 62 bpm). Slow ventricular rate noted overnight, early a.m. Isolated VEs were rare (<1.0%), and no VE Couplets or VE Triplets were present. 1 patient triggered event, not associated with significant arrhythmia other than atrial fibrillation  BP elevated at home, he was concerned, he restarted amlodipine 5 mg daily   (had some at home, left over) Better numbers since he started amlodipine, requesting long-term prescription  His weight has increased,  prior weights 160 now in the mid 170s possibly contributing to higher blood pressure Eating better  Neuropathy in feet, chronic issue Presents in wheelchair, unable to walk very far Severe knee pain on right, followed by orthopedics Cortisone, did not help Has f/u appt with orthopedics Recent fall injury to his knee, collapsed while shaving, " legs just gave out"  Lives by himself in an apartment Wife died, lives at church  Lab work reviewed CBC stable Reports his taking eliquis  Carotid u/s at [facility], records requested  Rare lasix, denies significant leg swelling  Declined EKG  Other past medical history reviewed  confusion January 09, 2020, was seen in the emergency room was not making sense, had a headache, was vomiting Was seen in the emergency room but left secondary to high patient volume and he was feeling better Feels back to his baseline today  Prior review of weights over the past 6 months Seen in CHF clinic January 02, 2020, weight at that time 162 pounds Weight  November 23, 2019 166 pounds Weight April 2021 was 160 pounds Weight in oncology 178 pounds performed yesterday His home weight today 168 pounds but is 173 pounds in our office  Echocardiogram November 13, 2019 performed for shortness of breath Normal ejection fraction, moderate pulmonary hypertension Moderate aortic valve stenosis Mean gradient 26 mmHg Dilated IVC  CT scan chest June 2021 with mild/small pleural effusion  Chronic back pain, multiple surgery "better" had hardware taken out Several back surgeries Back surgery  5/18 chroni back and leg pain, Fx of L4, Cement  Fusion  Past Medical History:  Diagnosis Date  . Abnormal CT scan    Asymmetric left rectal wall thickening   . Anemia   . Anxiety   . Arrhythmia   . Basal cell carcinoma 04/28/2020   L forehead   . CHF (congestive heart failure) ([place])   . Chronic lower back pain   . Complication of anesthesia    Memory loss 09/2015  .  Coronary artery disease   . Depression   . GERD (gastroesophageal reflux disease)   . Glaucoma   . H/O thoracic aortic aneurysm repair   . Headache   . Heart murmur   . History of being hospitalized    memory lose kidney funtion down blood pressure up  . History of blood transfusion    "think he had one when he had heart valve OR" (10/14/2016); "none since" (10/19/2017)  . History of chicken pox   . Hyperlipidemia   . Hypertension   . Lumbar stenosis   . Murmur   . Neuropathy   . Osteoarthritis    worse in feet and ankles  . Paroxysmal A-fib ([place])   . Poor short term memory    takes Aricept  . Rheumatoid arthritis ([place])    "all over" (10/14/2016)  . Schizophrenia ([place])   . Seasonal allergies   . Thoracic spine pain   . Valvular heart disease   . Wears glasses    reading   Past Surgical History:  Procedure Laterality Date  . AORTIC VALVE REPLACEMENT  2007   [facility].  [place], [place]; "pig valve"  . APPENDECTOMY  2010  . BACK SURGERY    . CARDIAC VALVE REPLACEMENT    . CATARACT EXTRACTION W/ INTRAOCULAR LENS  IMPLANT, BILATERAL Bilateral 2012  . COLONOSCOPY WITH PROPOFOL N/A 09/24/2016   Procedure: COLONOSCOPY WITH PROPOFOL;  Surgeon: Jonathon Bellows, MD;  Location: [facility] ENDOSCOPY;  Service: Endoscopy;  Laterality: N/A;  . ESOPHAGOGASTRODUODENOSCOPY (EGD) WITH PROPOFOL N/A 09/24/2016   Procedure: ESOPHAGOGASTRODUODENOSCOPY (EGD) WITH PROPOFOL;  Surgeon: Jonathon Bellows, MD;  [facility] ENDOSCOPY;  Service: Endoscopy;  Laterality: N/A;  . GIVENS CAPSULE STUDY N/A 09/24/2016   Procedure: GIVENS CAPSULE STUDY;  Surgeon: Jonathon Bellows, MD;  Location: [facility] ENDOSCOPY;  Service: Endoscopy;  Laterality: N/A;  . HAMMER TOE SURGERY Right 10/09/2015   Procedure: HAMMER TOE REPAIR WITH K-WIRE FIXATION RIGHT SECOND TOE;  Surgeon: Albertine Patricia, DPM;  Location: [place];  Service: Podiatry;  Laterality: Right;  WITH LOCAL  . HARDWARE REMOVAL N/A 11/02/2019   Procedure: Removal of  hardware thoracic spine;  Surgeon: Kary Kos, MD;  Location: [place];  Service: Neurosurgery;  Laterality: N/A;  posterior  . INGUINAL HERNIA REPAIR Right 05/05/2018   Medium Bard PerFix plug.  Surgeon: Robert Bellow, MD;  Location: [facility] ORS;  Service: General;  Laterality: Right;  . IR VERTEBROPLASTY CERV/THOR BX INC UNI/BIL INC/INJECT/IMAGING  10/27/2018  . JOINT REPLACEMENT Left    left total knee  . LAMINECTOMY WITH POSTERIOR LATERAL ARTHRODESIS LEVEL 3 N/A 09/28/2017   Procedure: LUMBAR  POSTEROR  FUSION REVISION - LUMBAR ONE -TWO, LUMBAR TWO -THREE,THREE-FOUR ,BILATERAL ARTHRODESIS REMOVAL LUMBAR THREE HARDWARE;  Surgeon: Kary Kos, MD;  Location: [place];  Service: Neurosurgery;  Laterality: N/A;  . LAMINECTOMY WITH POSTERIOR LATERAL ARTHRODESIS LEVEL 3 N/A 10/20/2017   Procedure: Revision fusion and removal of hardware Lumbar one, Pedicle screw fixation thoracic ten-lumbar two, thoracic nine-ten laminotomy, Posterior Lumbar Arthrodesis thoracic ten-lumbar two ;  Surgeon: Kary Kos, MD;  Location: [place];  Service: Neurosurgery;  Laterality: N/A;  . LAPAROSCOPIC CHOLECYSTECTOMY  2010  . LUMBAR FUSION Right 06/08/2017   LUMBAR THREE-FOUR, LUMBAR FOUR-FIVE POSTEROLATERAL ARTHRODESIS WITH RIGHT LUMBAR FOUR-FIVE LAMINECTOMY/FORAMINOTOMY  . LUMBAR LAMINECTOMY/DECOMPRESSION MICRODISCECTOMY Right 03/08/2016   Procedure: Laminectomy and Foraminotomy - Lumbar Five-Sacral One Right;  Surgeon: Kary Kos, MD;  Location: [place];  Service: Neurosurgery;  Laterality: Right;  Right  . MULTIPLE TOOTH EXTRACTIONS     "3"  . POSTERIOR LUMBAR FUSION  10/14/2016   L5-S1  . REPLACEMENT TOTAL KNEE Left 2003  . THORACIC AORTIC ANEURYSM REPAIR  2010     Current Meds  Medication Sig  . atorvastatin (LIPITOR) 40 MG tablet TAKE ONE TABLET BY MOUTH EVERY DAY  . calcium carbonate (OS-CAL - DOSED IN MG OF ELEMENTAL CALCIUM) 1250 (500 Ca) MG tablet Take 1 tablet by mouth daily with lunch.  . cetirizine (ZYRTEC) 10 MG  tablet Take 10 mg by mouth daily.  Marland Kitchen donepezil (ARICEPT) 5 MG tablet SMARTSIG:1 Tablet(s) By Mouth Every Evening  . ELIQUIS 5 MG TABS tablet TAKE 1 TABLET BY MOUTH TWICE DAILY  . fenofibrate 160 MG tablet TAKE ONE TABLET AT BEDTIME  . FLUoxetine (PROZAC) 20 MG capsule TAKE 1 CAPSULE BY MOUTH ONCE DAILY  . folic acid (FOLVITE) 1 MG tablet Take 1 mg by mouth in the morning and at bedtime.   . furosemide (LASIX) 40 MG tablet Take 40 mg by mouth as needed.  . gabapentin (NEURONTIN) 300 MG capsule TAKE 1 CAPSULE BY MOUTH 3 TIMES DAILY.  Marland Kitchen HYDROcodone-acetaminophen (NORCO) 10-325 MG tablet Take 1 tablet by mouth 4 (four) times daily as needed for moderate pain.   . hydrocortisone cream 1 % Apply 1 application topically daily as needed for itching.  . hydroxychloroquine (PLAQUENIL) 200 MG tablet Take 200 mg by mouth 3 (three) times a week. Tue, Thurs, and Sat  . lisinopril (ZESTRIL) 20 MG tablet Take 1.5 tablets (30 mg total) by mouth every evening.  . Multiple Vitamins-Minerals (PRESERVISION AREDS 2) CAPS Take 1 capsule by mouth 2 (two) times daily.  Marland Kitchen testosterone cypionate (DEPOTESTOSTERONE CYPIONATE) 200 MG/ML injection Inject 0.25 cc every 2 weeks  . traZODone (DESYREL) 50 MG tablet TAKE 1 TABLET AT BEDTIME MAY TAKE 2 TABLETS AT BEDTIME IF NEEDED  . vitamin B-12 (CYANOCOBALAMIN) 1000 MCG tablet Take 1,000 mcg by mouth daily with lunch.   . Vitamin D, Ergocalciferol, (DRISDOL) 1.25 MG (50000 UNIT) CAPS capsule Take 50,000 Units by mouth once a week.  . vitamin E 180 MG (400 UNITS) capsule Take 800 Units by mouth daily with lunch.     Allergies:   Penicillins and Demerol [meperidine]   Social History   Tobacco Use  . Smoking status: Former Smoker    Packs/day: 1.00    Years: 32.00    Pack years: 32.00    Types: Cigarettes    Quit date: 07/05/1981    Years since quitting: 38.9  . Smokeless tobacco: Never Used  Vaping Use  . Vaping Use: Never used  Substance Use Topics  . Alcohol use:  Yes    Comment: "glass of wine/month; if that"  . Drug use: Never     Family Hx: The patient's family history includes Asthma in his mother; Heart attack (age of onset: 76) in his father; Heart failure in his mother; Hypertension in his mother and sister; Osteoporosis in his mother. There is no history of Prostate cancer, Bladder Cancer, Kidney cancer, or Colon cancer.  ROS:   Please  see the history of present illness.    Review of Systems  Constitutional: Negative.        Weight gain  HENT: Negative.   Respiratory: Negative.   Cardiovascular: Negative.   Gastrointestinal: Negative.   Musculoskeletal: Negative.   Neurological: Negative.   Psychiatric/Behavioral: Negative.   All other systems reviewed and are negative.    Labs/Other Tests and Data Reviewed:    Recent Labs: 11/12/2019: B Natriuretic Peptide 461.9 02/22/2020: TSH 1.81 05/19/2020: ALT 20; BUN 21; Creatinine, Ser 1.06; Hemoglobin 11.7; Hemoglobin 11.0; Platelets 109; Potassium 3.8; Sodium 136   Recent Lipid Panel Lab Results  Component Value Date/Time   CHOL 123 09/11/2019 03:38 PM   TRIG 96 09/11/2019 03:38 PM   HDL 58 09/11/2019 03:38 PM   CHOLHDL 2.1 09/11/2019 03:38 PM   CHOLHDL 2 12/16/2016 10:06 AM   LDLCALC 47 09/11/2019 03:38 PM    Wt Readings from Last 3 Encounters:  06/13/20 174 lb (78.9 kg)  05/26/20 168 lb (76.2 kg)  05/19/20 167 lb 3.5 oz (75.8 kg)     Exam:    BP 136/76 (BP Location: Right Arm, Patient Position: Sitting, Cuff Size: Normal)   Pulse 64   Ht 5\' 11"  (1.803 m)   Wt 174 lb (78.9 kg)   SpO2 96%   BMI 24.27 kg/m  Constitutional:  oriented to person, place, and time. No distress.  HENT:  Head: Grossly normal Eyes:  no discharge. No scleral icterus.  Neck: No JVD, no carotid bruits  Cardiovascular: Regular rate and rhythm, no murmurs appreciated Pulmonary/Chest: Clear to auscultation bilaterally, no wheezes or rails Abdominal: Soft.  no distension.  no tenderness.   Musculoskeletal: Normal range of motion Neurological:  normal muscle tone. Coordination normal. No atrophy Skin: Skin warm and dry Psychiatric: normal affect, pleasant  ASSESSMENT & PLAN:    Permanent atrial fibrillation (HCC) -  Permanent atrial fibrillation,  Tolerating Eliquis 5 mg twice daily Metoprolol held previously for bradycardia ZIO results discussed with him, average heart rate sixty  Essential hypertension - Plan: EKG 12-Lead Prescription provided for amlodipine five daily Denies any symptoms, no leg swelling Blood pressure well controlled on today's visit  CAD in native artery - Plan: EKG 12-Lead Currently with no symptoms of angina. No further workup at this time. Continue current medication regimen.  Acute on chronic diastolic CHF Euvolemic on today's visit, rarely takes Lasix  Aneurysm of ascending aorta, s/post repair/grafting Echocardiogram with no significant change in aorta size 3.9 cm Periodic echo Consider repeat later in 2022  Mixed hyperlipidemia Cholesterol is at goal on the current lipid regimen. No changes to the medications were made.  S/P AVR (aortic valve replacement) Stable on echocardiogram Moderate gradient,  Very mild murmur on exam, asymptomatic Will need periodic echo.  Last July 2021  Cognitive decline No family presenting with him today   Total encounter time more than 25 minutes  Greater than 50% was spent in counseling and coordination of care with the patient    Signed, Ida Rogue, MD  06/13/2020 11:55 AM    [place] Office [address], [place], [place] [address]

## 2020-06-13 ENCOUNTER — Encounter: Payer: Self-pay | Admitting: Cardiovascular Disease

## 2020-06-13 ENCOUNTER — Other Ambulatory Visit: Payer: Self-pay

## 2020-06-13 ENCOUNTER — Ambulatory Visit (INDEPENDENT_AMBULATORY_CARE_PROVIDER_SITE_OTHER): Payer: Medicare HMO | Admitting: Cardiovascular Disease

## 2020-06-13 VITALS — BP 136/76 | HR 64 | Ht 71.0 in | Wt 174.0 lb

## 2020-06-13 DIAGNOSIS — I5032 Chronic diastolic (congestive) heart failure: Secondary | ICD-10-CM | POA: Diagnosis not present

## 2020-06-13 DIAGNOSIS — I712 Thoracic aortic aneurysm, without rupture: Secondary | ICD-10-CM

## 2020-06-13 DIAGNOSIS — I4821 Permanent atrial fibrillation: Secondary | ICD-10-CM

## 2020-06-13 DIAGNOSIS — E782 Mixed hyperlipidemia: Secondary | ICD-10-CM

## 2020-06-13 DIAGNOSIS — Z952 Presence of prosthetic heart valve: Secondary | ICD-10-CM | POA: Diagnosis not present

## 2020-06-13 DIAGNOSIS — I7121 Aneurysm of the ascending aorta, without rupture: Secondary | ICD-10-CM

## 2020-06-13 DIAGNOSIS — I272 Pulmonary hypertension, unspecified: Secondary | ICD-10-CM | POA: Diagnosis not present

## 2020-06-13 MED ORDER — AMLODIPINE BESYLATE 5 MG PO TABS: 5.0000 mg | ORAL_TABLET | Freq: Every day | ORAL | 3 refills | Status: DC

## 2020-06-13 NOTE — Telephone Encounter (Signed)
Patient calling to check on status of results States that no one discussed these results at appointment today Please call to discuss at [phone]

## 2020-06-13 NOTE — Patient Instructions (Signed)
Medication Instructions:  Stay on amlodipine 5 mg daily  If you need a refill on your cardiac medications before your next appointment, please call your pharmacy.    Lab work: No new labs needed   If you have labs (blood work) drawn today and your tests are completely normal, you will receive your results only by: Marland Kitchen MyChart Message (if you have MyChart) OR . A paper copy in the mail If you have any lab test that is abnormal or we need to change your treatment, we will call you to review the results.   Testing/Procedures: No new testing needed   Follow-Up: At [facility], you and your health needs are our priority.  As part of our continuing mission to provide you with exceptional heart care, we have created designated Provider Care Teams.  These Care Teams include your primary Cardiologist (physician) and Advanced Practice Providers (APPs -  Physician Assistants and Nurse Practitioners) who all work together to provide you with the care you need, when you need it.  . You will need a follow up appointment in 6 months  . Providers on your designated Care Team:   . Murray Hodgkins, NP . Christell Faith, PA-C . Marrianne Mood, PA-C  Any Other Special Instructions Will Be Listed Below (If Applicable).  COVID-19 Vaccine Information can be found at: ShippingScam.co.uk For questions related to vaccine distribution or appointments, please email [email or web address] or call [phone].

## 2020-06-13 NOTE — Telephone Encounter (Signed)
Return pt's call, pt very upset that Dr. [name] did not review his Zio results today during his f/u visit, Dr. [name] notes stated he discuss Zio results with pt with pt having understanding. Advised that RN Jinny Blossom tried to reach out to him regarding his Zio results on 1/17, but no answer and was not able to leave a VM. Went over [employer] results and suggestion of possible need for undiagnosed sleep apnea. Pt upset that this was not address at today's appt. Pt would like to speak with Dr. [name] or possibly [name], NP regarding his results, pt is concern of his "on going a-fib" as noted on his MyChart results. Suggested to make an appt if he had questions and concerns he would like to address in person regarding his Zio monitor results, pt declined, stating"this is a matter that can be handle over the phone. Advised pt that I can send a message to Dr. [name] and [name], NP in hopes one of them will return his call to discuss his results. Pt stated "that is not good enough, I don't see why they can't pick up the phone and call someone, that's what they are for". Tried to explain that most time the providers delegate results to nurses to call and consult pt after the providers have a chance to review and make recommendations. Although, promised pt I would send a message with his concerns so that one of them may return his call. Nothing further at this time.

## 2020-06-15 NOTE — Telephone Encounter (Signed)
One of tests [place] can try to call him this week monitor discussed on his visit but I think he was more concerned about blood pressure He is in permanent atrial fibrillation Has been in atrial fibrillation for a year and a half Has been asymptomatic.  No other new findings on monitor

## 2020-06-17 ENCOUNTER — Encounter: Payer: Self-pay | Admitting: Dermatology

## 2020-06-17 ENCOUNTER — Ambulatory Visit (INDEPENDENT_AMBULATORY_CARE_PROVIDER_SITE_OTHER): Payer: Medicare HMO | Admitting: Dermatology

## 2020-06-17 ENCOUNTER — Other Ambulatory Visit: Payer: Self-pay

## 2020-06-17 DIAGNOSIS — L578 Other skin changes due to chronic exposure to nonionizing radiation: Secondary | ICD-10-CM | POA: Diagnosis not present

## 2020-06-17 DIAGNOSIS — L570 Actinic keratosis: Secondary | ICD-10-CM

## 2020-06-17 DIAGNOSIS — C44319 Basal cell carcinoma of skin of other parts of face: Secondary | ICD-10-CM

## 2020-06-17 DIAGNOSIS — L820 Inflamed seborrheic keratosis: Secondary | ICD-10-CM

## 2020-06-17 NOTE — Progress Notes (Signed)
   Follow-Up Visit   Subjective  Bryan [name] is a 80 y.o. male who presents for the following: basal cell carcinoma (Of the L forehead - bx proven, patient is here today for an ED&C. ).  The following portions of the chart were reviewed this encounter and updated as appropriate:   Tobacco  Allergies  Meds  Problems  Med Hx  Surg Hx  Fam Hx     Review of Systems:  No other skin or systemic complaints except as noted in HPI or Assessment and Plan.  Objective  Well appearing patient in no apparent distress; mood and affect are within normal limits.  A focused examination was performed including the face. Relevant physical exam findings are noted in the Assessment and Plan.  Objective  L forehead: Pink biopsy site   Objective  Forehead x 15 (15): Erythematous thin papules/macules with gritty scale.   Objective  Face x 3 (3): Erythematous keratotic or waxy stuck-on papule or plaque.   Assessment & Plan  Basal cell carcinoma (BCC) of skin of other part of face L forehead  Destruction of lesion Complexity: extensive   Destruction method: electrodesiccation and curettage   Informed consent: discussed and consent obtained   Timeout:  patient name, date of birth, surgical site, and procedure verified Procedure prep:  Patient was prepped and draped in usual sterile fashion Prep type:  Isopropyl alcohol Anesthesia: the lesion was anesthetized in a standard fashion   Anesthetic:  1% lidocaine w/ epinephrine 1-100,000 buffered w/ 8.4% NaHCO3 Curettage performed in three different directions: Yes   Electrodesiccation performed over the curetted area: Yes   Lesion length (cm):  1.2 Lesion width (cm):  1.2 Margin per side (cm):  0.2 Final wound size (cm):  1.6 Hemostasis achieved with:  pressure, aluminum chloride and electrodesiccation Outcome: patient tolerated procedure well with no complications   Post-procedure details: sterile dressing applied and wound care  instructions given   Dressing type: bandage and petrolatum    AK (actinic keratosis) (15) Forehead x 15  Destruction of lesion - Forehead x 15 Complexity: simple   Destruction method: cryotherapy   Informed consent: discussed and consent obtained   Timeout:  patient name, date of birth, surgical site, and procedure verified Lesion destroyed using liquid nitrogen: Yes   Region frozen until ice ball extended beyond lesion: Yes   Outcome: patient tolerated procedure well with no complications   Post-procedure details: wound care instructions given    Inflamed seborrheic keratosis (3) Face x 3  Destruction of lesion - Face x 3 Complexity: simple   Destruction method: cryotherapy   Informed consent: discussed and consent obtained   Timeout:  patient name, date of birth, surgical site, and procedure verified Lesion destroyed using liquid nitrogen: Yes   Region frozen until ice ball extended beyond lesion: Yes   Outcome: patient tolerated procedure well with no complications   Post-procedure details: wound care instructions given    Actinic Damage - chronic, secondary to cumulative UV radiation exposure/sun exposure over time - diffuse scaly erythematous macules with underlying dyspigmentation - Recommend daily broad spectrum sunscreen SPF 30+ to sun-exposed areas, reapply every 2 hours as needed.  - Call for new or changing lesions.  Return in about 3 months (around 09/15/2020).  Luther Redo, CMA, am acting as scribe for Sarina Ser, MD .  Documentation: I have reviewed the above documentation for accuracy and completeness, and I agree with the above.  Sarina Ser, MD

## 2020-06-17 NOTE — Telephone Encounter (Signed)
Called and spoke with Bryan [name] regarding his ZIO result.   Tells me his sister reviewed his MyChart and was concerned that he was in atrial fibrillation 100% of the time. Reviewed ZIO result in detail with him and he was appreciative. We reviewed that he has been in atrial fibrillation since 02/2021. Reviewed that since his atrial fibrillation is asymptomatic and well controlled rate with severely dilated left atrium there is little to no benefit to pursuing rhythm control. He was reassured by results of Affirm trial which we reviewed.   We discussed low heart rates in the evening. Tells me he does not snore and wakes feeling well rested. He wishes to defer sleep apnea testing at this time. Tells me he is undergoing workup for potential knee surgery and is understandably prioritizing this.   [name], NP

## 2020-06-17 NOTE — Patient Instructions (Signed)

## 2020-06-20 ENCOUNTER — Encounter: Payer: Self-pay | Admitting: Dermatology

## 2020-06-20 DIAGNOSIS — M5136 Other intervertebral disc degeneration, lumbar region: Secondary | ICD-10-CM | POA: Diagnosis not present

## 2020-06-20 DIAGNOSIS — M961 Postlaminectomy syndrome, not elsewhere classified: Secondary | ICD-10-CM | POA: Diagnosis not present

## 2020-06-20 DIAGNOSIS — Z79899 Other long term (current) drug therapy: Secondary | ICD-10-CM | POA: Diagnosis not present

## 2020-06-20 DIAGNOSIS — G894 Chronic pain syndrome: Secondary | ICD-10-CM | POA: Diagnosis not present

## 2020-06-20 DIAGNOSIS — Z79891 Long term (current) use of opiate analgesic: Secondary | ICD-10-CM | POA: Diagnosis not present

## 2020-06-20 DIAGNOSIS — Z5181 Encounter for therapeutic drug level monitoring: Secondary | ICD-10-CM | POA: Diagnosis not present

## 2020-06-20 DIAGNOSIS — M179 Osteoarthritis of knee, unspecified: Secondary | ICD-10-CM | POA: Diagnosis not present

## 2020-06-20 DIAGNOSIS — M48061 Spinal stenosis, lumbar region without neurogenic claudication: Secondary | ICD-10-CM | POA: Diagnosis not present

## 2020-06-27 ENCOUNTER — Telehealth: Payer: Self-pay | Admitting: Cardiovascular Disease

## 2020-06-27 NOTE — Telephone Encounter (Signed)
Patient calling the office for samples of medication:   1.  What medication and dosage are you requesting samples for? Eliquis 5 mg po BID   2.  Are you currently out of this medication? Will be out Wednesday    Medicare dropped part D coverage in error and patient is in the process of trying to correct but cannot afford self pay cost .

## 2020-06-30 NOTE — Telephone Encounter (Signed)
Samples left in front office for patient to pick up. Call attempted to inform patient-no answer.  Medication Samples have been provided to the patient.  Drug name: Eliquis       Strength: 5MG         Qty: 28  LOT: [id number]  Exp.Date: 03/2022  Dosing instructions: one tablet PO BID  Rene Paci McClain 9:03 AM 06/30/2020

## 2020-07-02 ENCOUNTER — Telehealth: Payer: Self-pay | Admitting: Cardiovascular Disease

## 2020-07-02 NOTE — Telephone Encounter (Signed)
Need to clarify what pt's procedure is, request just states "right traditional knee." Is this a request for a total knee arthroplasty?

## 2020-07-02 NOTE — Telephone Encounter (Signed)
Pharmacy please comment on anticoagulation and then we will contact the patient for clearance.  Kerin Ransom PA-C 07/02/2020 11:19 AM

## 2020-07-02 NOTE — Telephone Encounter (Signed)
   [facility] Pre-operative Risk Assessment    HEARTCARE STAFF: - Please ensure there is not already an duplicate clearance open for this procedure. - Under Visit Info/Reason for Call, type in Other and utilize the format Clearance MM/DD/YY or Clearance TBD. Do not use dashes or single digits. - If request is for dental extraction, please clarify the # of teeth to be extracted.  Request for surgical clearance:  1. What type of surgery is being performed? RIGHT TRADITIONAL KNEE  2. When is this surgery scheduled? 08/07/20  3. What type of clearance is required (medical clearance vs. Pharmacy clearance to hold med vs. Both)? BOTH 4. Are there any medications that need to be held prior to surgery and how long? ELIQUIS  5. Practice name and name of physician performing surgery? [place] [place]  6. What is the office phone number? [phone]   7.   What is the office fax number? [phone]  8.   Anesthesia type (None, local, MAC, general) ? LOCAL/SPINAL   Bryan [name] 07/02/2020, 9:14 AM  _________________________________________________________________   (provider comments below)

## 2020-07-03 NOTE — Telephone Encounter (Signed)
Per chart review, pharmacy was inquiring on clarification, will re-route.

## 2020-07-03 NOTE — Telephone Encounter (Addendum)
   Primary Cardiologist: Ida Rogue, MD  Chart reviewed as part of pre-operative protocol coverage. [name] was recently seen on 06/13/20 by DR. Gollan. H/o outlined of TAA, bicuspid AV, s/p bioprosthetic AVR and ascending aorta grafting 2010, HTN, HLD, mild CKD, persistent AF, pulm HTN, neuropathy, mild-moderate CAD by note (do not have further records available), chronic diastolic CHF. Pharm note reviewed.  Per his last office note, patient not able to ambulate far therefore will need to route to primary cardiologist for input on medical clearance since unable to perform 4 METS to clear by preop protocol. Dr. [name] - Please route response to [facility] PREOP (the pre-op pool). Thank you.   Charlie Pitter, PA-C 07/03/2020, 4:07 PM

## 2020-07-03 NOTE — Telephone Encounter (Signed)
I s/w Bryan [name] at [facility] and confirmed the surgery planned is Right Total Knee Arthroplasty. I will re-send to pre op for further evaluation.

## 2020-07-03 NOTE — Telephone Encounter (Signed)
Patient with diagnosis of A Fib on Eliquis for anticoagulation.  Patient has bioprosthetic aortic valve.  Procedure: Right Total Knee Arthroplasty Date of procedure: 08/07/20   CHA2DS2-VASc Score = 5  This indicates a 7.2% annual risk of stroke. The patient's score is based upon: CHF History: Yes HTN History: Yes Diabetes History: No Stroke History: No Vascular Disease History: Yes Age Score: 2 Gender Score: 0    CrCl 76mL/min Platelet count 109K   Per office protocol, patient can hold Xarelto for 3 days prior to procedure.   Patient will not need bridging with Lovenox (enoxaparin) around procedure.  If not bridging, patient should restart Xarelto on the evening of procedure or day after, at discretion of procedure MD

## 2020-07-06 NOTE — Telephone Encounter (Signed)
Acceptable risk for surgery No further testing needed Does not have a hx of significant CAD/angina

## 2020-07-07 NOTE — Telephone Encounter (Signed)
   Primary Cardiologist: Ida Rogue, MD  Chart reviewed as part of pre-operative protocol coverage.   See notes from Dr. [name] and clinical PharmD. Pt contacted today.  He has not had any changes since last seen.  He has not had any chest pain, shortness of breath.    Recommendations: 1. Patient may proceed with planned procedure at acceptable CV risk.  No further testing is needed.  2. Patient may hold Apixaban (Eliquis) for 3 prior to planned procedure.  His anticoagulation should be resumed post op as soon as it is felt to safe from a bleeding perspective.  Please call with questions. Richardson Dopp, PA-C 07/07/2020, 1:24 PM

## 2020-07-07 NOTE — Telephone Encounter (Signed)
Pt is on Apixaban, not Rivaroxaban.  Please clarify recommendations for holding anticoagulation.  Richardson Dopp, PA-C    07/07/2020 8:40 AM

## 2020-07-07 NOTE — Telephone Encounter (Signed)
Notes faxed to surgeon's office. This phone note will be removed from the preop pool. Richardson Dopp, PA-C    07/07/2020 1:41 PM

## 2020-07-07 NOTE — Telephone Encounter (Signed)
Pt can hold Eliquis for 3 days prior to TKA.

## 2020-07-16 ENCOUNTER — Encounter: Payer: Self-pay | Admitting: Family Medicine

## 2020-07-16 ENCOUNTER — Telehealth: Payer: Self-pay | Admitting: Family Medicine

## 2020-07-16 ENCOUNTER — Ambulatory Visit
Admission: EM | Admit: 2020-07-16 | Discharge: 2020-07-16 | Disposition: A | Discharge status: Home / Self Care | Payer: Medicare Other

## 2020-07-16 ENCOUNTER — Other Ambulatory Visit: Payer: Self-pay

## 2020-07-16 NOTE — ED Triage Notes (Addendum)
Pt c/o acute abdominal pain middle abdomen, umbilical area onset last night, "feels like a knot" 8/10 scale, radiating to middle back area "never had pain like this". States it kept him awake all night. Also c/o nausea, had one episode of emesis last night. "Sweating" off and on last night.  States he ate boiled frozen shrimp, and slaw last night, experienced abdominal pain aprrox 1 hour after eating, took dulcolax, had moderate to large BM with small improvement to pain. Skin pale, moist, hypoactive bowel sounds, abdomen firm, painful on light palpation. Took narcotic pain reliever earlier today w/o improvement to pain.  Pt with tachypnea, guarding Denies congestion, HA, sore throat, cough, or other URI symptoms. Notified provider of pt status. Advised pt to go to ED for CT imaging of abdomen. Pt elected to go to [facility]

## 2020-07-16 NOTE — Telephone Encounter (Signed)
The patient boiled shrimp and slaw for supper shortly after eating he stated his stomach started hurting about the middle of his backi He took a dulcolax around 7:30 he had a bowel movement that eased his pain a little.However, he is still in pain. Patient does not want to go to the ED. He stated he would go to an urgent care.

## 2020-07-16 NOTE — ED Notes (Signed)
Patient is being discharged from the Urgent Care and sent to the Emergency Department via POV . Per T. Bast, NP, patient is in need of higher level of care due to severe acute abdominal pain, back pain, n/v, h/o AAA. Patient is aware and verbalizes understanding of plan of care.  Vitals:   07/16/20 1432 07/16/20 1432  BP: 126/83 (!) 127/94  Pulse: 98   Resp: (!) 22   Temp: 97.7 F (36.5 C)   SpO2: 97%

## 2020-07-16 NOTE — Telephone Encounter (Signed)
It appears urgent care advised that he go to the emergency department.  Please follow-up with the patient to make sure he goes to the ED to be evaluated.

## 2020-07-16 NOTE — Telephone Encounter (Signed)
Confirmed patient at [facility].

## 2020-07-16 NOTE — Telephone Encounter (Signed)
Thanks

## 2020-07-16 NOTE — Telephone Encounter (Signed)
Patient is confirmed at [facility] ED now arrived at 3 :25

## 2020-07-17 ENCOUNTER — Encounter: Payer: Self-pay | Admitting: Family Medicine

## 2020-07-30 ENCOUNTER — Encounter: Payer: Self-pay | Admitting: Hematology and Oncology

## 2020-08-08 ENCOUNTER — Telehealth: Payer: Self-pay | Admitting: Family Medicine

## 2020-08-08 NOTE — Telephone Encounter (Signed)
Bryan [name] Placed called pt is being discharged from rehab and needs a HFU No available appt slots

## 2020-08-11 ENCOUNTER — Other Ambulatory Visit: Payer: Self-pay | Admitting: *Deleted

## 2020-08-11 ENCOUNTER — Telehealth: Payer: Self-pay | Admitting: *Deleted

## 2020-08-11 DIAGNOSIS — D696 Thrombocytopenia, unspecified: Secondary | ICD-10-CM

## 2020-08-11 DIAGNOSIS — D509 Iron deficiency anemia, unspecified: Secondary | ICD-10-CM

## 2020-08-11 DIAGNOSIS — D72819 Decreased white blood cell count, unspecified: Secondary | ICD-10-CM

## 2020-08-11 NOTE — Telephone Encounter (Signed)
Called patient to schedule a HFU and mailbox is full.  Bryan [name],cma

## 2020-08-11 NOTE — Telephone Encounter (Signed)
I schedule patient for tomorrow 3/22 and explained that if he needed a transfusion it would not be tomorrow.

## 2020-08-11 NOTE — Progress Notes (Signed)
[facility]  [address], [address] [place], [place] [address] Phone: [phone]  Fax: [phone]   Clinic Day:  08/12/2020    Referring physician: [name], MD  Chief Complaint: Bryan [name] is a 80 y.o. male with arthritis, iron deficiency anemia, and mild pancytopenia who is seen for 3 month assessment.  HPI:  The patient was last seen in the hematology clinic on 05/19/2020. At that time, he felt good. He was eating well. He had minor bruising on Eliquis.  He has not been driving due to knee pain and neuropathy. He used a cane or walker to ambulate. Hematocrit was 35.4, hemoglobin 11.7, MCV 95.2, platelets 109,000, WBC 3,000 (ANC 1900).  Ferritin was 135 with an iron saturation of 19% and a TIBC of 374. Retic was 2.2%. Sed rate was 10. LDH was 197. G6PD assay was normal. He continued Plaquenil.  The patient was admitted to [facility] from 07/16/2020 - 07/28/2020 with a small bowel obstruction (SBO).  He presented with abdominal pain, nausea, and vomiting. Abdomen CT showed evidence of a SBO with ileal transition point. He was taken to the OR for bowel resection on 07/17/2020; he was noted to have 500 cc of bloody ascites upon celiotomy, with necrotic distal ileum and hemorrhagic necrosis. Patient underwent a right hemicolectomy with terminal ileum removal. He received 2 units PRBC. He was discharged to a SNF. CBC on 07/25/2020 revealed a hematocrit of 23.9, hemoglobin 8.2, MCV 93.7, platelets 172,000, WBC 9,500.  Pathology from the ileum, cecum, ileocecectomy revealed a segment of terminal ileum with ischemic enteritis characterized by mucosal necrosis, submucosal and serosal congestion and hemorrhage and acute and chronic serositis.  There was no dysplasia or malignancy.  The distal margin of questionable histologic viability with extensive mucosal necrosis and accompanying hemorrhage; proximal margin appeared histologically viable.  Mesenteric vessels had  arteriosclerosis.  Right hemicolectomy revealed a segment of terminal ileum and proximal colon with ischemic enteritis characterized by diffuse mucosal necrosis, submucosal congestion and edema, and acute and chronic serositis.  There was no dysplasia or malignancy.  The distal margin appears histologically viable while proximal margin is of questionable histologic viability with mucosal necrosis and hemorrhage.  The mesenteric margin had moderate to severe vascular arteriosclerosis.  Four lymph nodes were negative for malignancy.  The patient saw Lanier Ensign, NP on 08/07/2020 for a post-op visit. He reported diarrhea. If diarrhea persisted for another month, will consider GI referral. Silver nitrate was applied to his incision. He was to follow-up prn.  The patient called on 08/11/2020 and reported that he felt like he needed a transfusion. He requested that his appointment on 08/21/2020 be moved up.  CBC has been followed: 07/28/2020:  Hematocrit 19.0, hemoglobin 6.6, MCV 90.3, platelets 168,000, WBC 5500. 07/28/2020:  Hematocrit 23.9, hemoglobin 8.4, MCV 89.8, platelets 182,000, WBC 6900.  During the interim, he has been tired, weak, and short of breath. His diarrhea has improved; he takes imodium occasionally. He denies dizziness, lightheadedness, and bleeding of any kind. His arthritis is stable.  He was discharged from rehab on 08/09/2020. His stools were black the first 2 days he was at rehab. He has been fatigued since he got home. Yesterday, he was very pale. He has been eating well.  He feels like he needs a transfusion.  He has seen Dr. Jonathon Bellows in the past.   Past Medical History:  Diagnosis Date  . Abnormal CT scan    Asymmetric left rectal wall thickening   .  Anemia   . Anxiety   . Arrhythmia   . Basal cell carcinoma 04/28/2020   L forehead - ED&C 06/17/2020  . CHF (congestive heart failure) ([place])   . Chronic lower back pain   . Complication of anesthesia    Memory loss  09/2015  . Coronary artery disease   . Depression   . GERD (gastroesophageal reflux disease)   . Glaucoma   . H/O thoracic aortic aneurysm repair   . Headache   . Heart murmur   . History of being hospitalized    memory lose kidney funtion down blood pressure up  . History of blood transfusion    "think he had one when he had heart valve OR" (10/14/2016); "none since" (10/19/2017)  . History of chicken pox   . Hyperlipidemia   . Hypertension   . Lumbar stenosis   . Murmur   . Neuropathy   . Osteoarthritis    worse in feet and ankles  . Paroxysmal A-fib ([place])   . Poor short term memory    takes Aricept  . Rheumatoid arthritis ([place])    "all over" (10/14/2016)  . Schizophrenia ([place])   . Seasonal allergies   . Thoracic spine pain   . Valvular heart disease   . Wears glasses    reading    Past Surgical History:  Procedure Laterality Date  . AORTIC VALVE REPLACEMENT  2007   [facility].  [place], [place]; "pig valve"  . APPENDECTOMY  2010  . BACK SURGERY    . CARDIAC VALVE REPLACEMENT    . CATARACT EXTRACTION W/ INTRAOCULAR LENS  IMPLANT, BILATERAL Bilateral 2012  . COLONOSCOPY WITH PROPOFOL N/A 09/24/2016   Procedure: COLONOSCOPY WITH PROPOFOL;  Surgeon: Jonathon Bellows, MD;  Location: [facility] ENDOSCOPY;  Service: Endoscopy;  Laterality: N/A;  . ESOPHAGOGASTRODUODENOSCOPY (EGD) WITH PROPOFOL N/A 09/24/2016   Procedure: ESOPHAGOGASTRODUODENOSCOPY (EGD) WITH PROPOFOL;  Surgeon: Jonathon Bellows, MD;  Location: [facility] ENDOSCOPY;  Service: Endoscopy;  Laterality: N/A;  . GIVENS CAPSULE STUDY N/A 09/24/2016   Procedure: GIVENS CAPSULE STUDY;  Surgeon: Jonathon Bellows, MD;  Location: [facility] ENDOSCOPY;  Service: Endoscopy;  Laterality: N/A;  . HAMMER TOE SURGERY Right 10/09/2015   Procedure: HAMMER TOE REPAIR WITH K-WIRE FIXATION RIGHT SECOND TOE;  Surgeon: Albertine Patricia, DPM;  Location: [place];  Service: Podiatry;  Laterality: Right;  WITH LOCAL  . HARDWARE REMOVAL N/A 11/02/2019   Procedure:  Removal of hardware thoracic spine;  Surgeon: Kary Kos, MD;  Location: [place];  Service: Neurosurgery;  Laterality: N/A;  posterior  . INGUINAL HERNIA REPAIR Right 05/05/2018   Medium Bard PerFix plug.  Surgeon: Robert Bellow, MD;  Location: [facility] ORS;  Service: General;  Laterality: Right;  . IR VERTEBROPLASTY CERV/THOR BX INC UNI/BIL INC/INJECT/IMAGING  10/27/2018  . JOINT REPLACEMENT Left    left total knee  . LAMINECTOMY WITH POSTERIOR LATERAL ARTHRODESIS LEVEL 3 N/A 09/28/2017   Procedure: LUMBAR  POSTEROR  FUSION REVISION - LUMBAR ONE -TWO, LUMBAR TWO -THREE,THREE-FOUR ,BILATERAL ARTHRODESIS REMOVAL LUMBAR THREE HARDWARE;  Surgeon: Kary Kos, MD;  Location: [place];  Service: Neurosurgery;  Laterality: N/A;  . LAMINECTOMY WITH POSTERIOR LATERAL ARTHRODESIS LEVEL 3 N/A 10/20/2017   Procedure: Revision fusion and removal of hardware Lumbar one, Pedicle screw fixation thoracic ten-lumbar two, thoracic nine-ten laminotomy, Posterior Lumbar Arthrodesis thoracic ten-lumbar two ;  Surgeon: Kary Kos, MD;  Location: [place];  Service: Neurosurgery;  Laterality: N/A;  . LAPAROSCOPIC CHOLECYSTECTOMY  2010  . LUMBAR FUSION  Right 06/08/2017   LUMBAR THREE-FOUR, LUMBAR FOUR-FIVE POSTEROLATERAL ARTHRODESIS WITH RIGHT LUMBAR FOUR-FIVE LAMINECTOMY/FORAMINOTOMY  . LUMBAR LAMINECTOMY/DECOMPRESSION MICRODISCECTOMY Right 03/08/2016   Procedure: Laminectomy and Foraminotomy - Lumbar Five-Sacral One Right;  Surgeon: Kary Kos, MD;  Location: [place];  Service: Neurosurgery;  Laterality: Right;  Right  . MULTIPLE TOOTH EXTRACTIONS     "3"  . POSTERIOR LUMBAR FUSION  10/14/2016   L5-S1  . REPLACEMENT TOTAL KNEE Left 2003  . THORACIC AORTIC ANEURYSM REPAIR  2010    Family History  Problem Relation Age of Onset  . Heart failure Mother   . Hypertension Mother   . Asthma Mother   . Osteoporosis Mother   . Heart attack Father 20       MI  . Hypertension Sister   . Prostate cancer Neg Hx   . Bladder Cancer Neg  Hx   . Kidney cancer Neg Hx   . Colon cancer Neg Hx     Social History:  reports that he quit smoking about 39 years ago. His smoking use included cigarettes. He has a 32.00 pack-year smoking history. He has never used smokeless tobacco. He reports current alcohol use. He reports that he does not use drugs. He smoked 1 pack a day x 12 years. He stopped smoking 30 years ago. He drinks a glass of wine occasionally. 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 His wife passed away in 07-19-18. His friend is Stanford Scotland. The patient is accompanied by his friend, Stanford Scotland, today.   Allergies:  Allergies  Allergen Reactions  . Penicillins Hives, Swelling and Other (See Comments)    SWELLING REACTION UNSPECIFIED PATIENT HAS TAKEN AMOXICILLIN ON MED HX FROM [facility] PATIENT HAS HAD A PCN REACTION WITH IMMEDIATE RASH, FACIAL/TONGUE/THROAT SWELLING, SOB, OR LIGHTHEADEDNESS WITH HYPOTENSION:  #  #  #  YES  #  #  #   Has patient had a PCN reaction causing severe rash involving mucus membranes or skin necrosis: No Has patient had a PCN reaction that required hospitalization No Has patient had a PCN reaction occurring within the last 10 years: No  . Demerol [Meperidine] Hives and Nausea And Vomiting  . Other     Current Medications: Current Outpatient Medications  Medication Sig Dispense Refill  . amLODipine (NORVASC) 5 MG tablet Take 1 tablet (5 mg total) by mouth daily. 90 tablet 3  . ARIPiprazole (ABILIFY) 5 MG tablet Take by mouth.    Marland Kitchen atorvastatin (LIPITOR) 40 MG tablet TAKE ONE TABLET BY MOUTH EVERY DAY 90 tablet 3  . calcium carbonate (OS-CAL - DOSED IN MG OF ELEMENTAL CALCIUM) 1250 (500 Ca) MG tablet Take 1 tablet by mouth daily with lunch.    . cetirizine (ZYRTEC) 10 MG tablet Take 10 mg by mouth daily.    Marland Kitchen ELIQUIS 5 MG TABS tablet TAKE 1 TABLET BY MOUTH TWICE DAILY 180 tablet 2  . fenofibrate 160 MG tablet TAKE ONE TABLET AT BEDTIME 90 tablet 3  .  FLUoxetine (PROZAC) 20 MG capsule TAKE 1 CAPSULE BY MOUTH ONCE DAILY 90 capsule 1  . furosemide (LASIX) 40 MG tablet Take 40 mg by mouth as needed.    . gabapentin (NEURONTIN) 300 MG capsule TAKE 1 CAPSULE BY MOUTH 3 TIMES DAILY. 270 capsule 1  . hydrALAZINE (APRESOLINE) 25 MG tablet Take by mouth.    Marland Kitchen HYDROcodone-acetaminophen (NORCO) 10-325 MG tablet Take 1 tablet by mouth 4 (four) times daily as needed  for moderate pain.     . hydrocortisone cream 1 % Apply 1 application topically daily as needed for itching.    . hydroxychloroquine (PLAQUENIL) 200 MG tablet Take 200 mg by mouth 3 (three) times a week. Tue, Thurs, and Sat    . lisinopril (ZESTRIL) 20 MG tablet Take 1.5 tablets (30 mg total) by mouth every evening. 135 tablet 1  . Multiple Vitamins-Minerals (PRESERVISION AREDS 2) CAPS Take 1 capsule by mouth 2 (two) times daily.    Marland Kitchen testosterone cypionate (DEPOTESTOSTERONE CYPIONATE) 200 MG/ML injection Inject 0.25 cc every 2 weeks 10 mL 0  . traZODone (DESYREL) 50 MG tablet TAKE 1 TABLET AT BEDTIME MAY TAKE 2 TABLETS AT BEDTIME IF NEEDED 90 tablet 1  . vitamin B-12 (CYANOCOBALAMIN) 1000 MCG tablet Take 1,000 mcg by mouth daily with lunch.     . Vitamin D, Ergocalciferol, (DRISDOL) 1.25 MG (50000 UNIT) CAPS capsule Take 50,000 Units by mouth once a week.    . vitamin E 180 MG (400 UNITS) capsule Take 800 Units by mouth daily with lunch.    . diphenhydrAMINE (SOMINEX) 25 MG tablet Take by mouth. (Patient not taking: Reported on 08/12/2020)    . donepezil (ARICEPT) 5 MG tablet SMARTSIG:1 Tablet(s) By Mouth Every Evening (Patient not taking: Reported on 08/12/2020)    . folic acid (FOLVITE) 1 MG tablet Take 1 mg by mouth in the morning and at bedtime.  (Patient not taking: Reported on 08/12/2020)     No current facility-administered medications for this visit.   Facility-Administered Medications Ordered in Other Visits  Medication Dose Route Frequency Provider Last Rate Last Admin  . 0.9 %   sodium chloride infusion   Intravenous Continuous Lequita Asal, MD 10 mL/hr at 12/11/19 1123 New Bag at 12/11/19 1123    Review of Systems  Constitutional: Positive for malaise/fatigue and weight loss (1 lb). Negative for chills, diaphoresis and fever.  HENT: Negative.  Negative for congestion, ear discharge, ear pain, hearing loss, nosebleeds, sinus pain, sore throat and tinnitus.   Eyes: Negative.  Negative for blurred vision, double vision and photophobia.  Respiratory: Positive for shortness of breath. Negative for cough, hemoptysis and sputum production.   Cardiovascular: Negative.  Negative for chest pain, palpitations, orthopnea and leg swelling.  Gastrointestinal: Positive for diarrhea (improved). Negative for abdominal pain, blood in stool, constipation, heartburn, melena, nausea and vomiting.  Genitourinary: Negative.  Negative for dysuria, frequency, hematuria and urgency.  Musculoskeletal: Positive for joint pain (rheumatoid arthritis, right knee). Negative for back pain, myalgias and neck pain.  Skin: Negative for itching and rash.  Neurological: Positive for sensory change (neuropathy, on Neurontin) and weakness. Negative for dizziness, tingling and headaches.  Endo/Heme/Allergies: Does not bruise/bleed easily.  Psychiatric/Behavioral: Positive for memory loss (on aricept). Negative for depression (lost his wife in 06/2018) and substance abuse. The patient is not nervous/anxious and does not have insomnia.   All other systems reviewed and are negative.  Performance status (ECOG):  2  Vitals Blood pressure 127/70, pulse 76, temperature (!) 97.2 F (36.2 C), temperature source Tympanic, resp. rate 18, weight 166 lb (75.3 kg), SpO2 98 %.   Physical Exam Vitals and nursing note reviewed.  Constitutional:      General: He is not in acute distress.    Appearance: He is well-developed. He is not diaphoretic.     Comments: Rolling walker by his side. Requires minimal  assistance onto exam table.  HENT:     Head: Normocephalic and atraumatic.  Comments: White hair and beard.    Mouth/Throat:     Mouth: Mucous membranes are moist.     Pharynx: Oropharynx is clear. No oropharyngeal exudate.  Eyes:     General: No scleral icterus.    Extraocular Movements: Extraocular movements intact.     Conjunctiva/sclera: Conjunctivae normal.     Pupils: Pupils are equal, round, and reactive to light.     Comments: Glasses.  Hazel eyes.  Cardiovascular:     Rate and Rhythm: Normal rate and regular rhythm.     Heart sounds: Normal heart sounds. No murmur heard.   Pulmonary:     Effort: No respiratory distress.     Breath sounds: No wheezing or rales.  Chest:  Breasts:     Right: No axillary adenopathy or supraclavicular adenopathy.     Left: No axillary adenopathy or supraclavicular adenopathy.    Abdominal:     General: Bowel sounds are normal. There is no distension.     Palpations: Abdomen is soft. There is no mass.     Tenderness: There is no abdominal tenderness. There is no guarding or rebound.  Musculoskeletal:        General: No swelling or tenderness. Normal range of motion.     Cervical back: Normal range of motion.     Right lower leg: Edema (1+ at ankles) present.     Left lower leg: Edema (1+ at ankles) present.  Lymphadenopathy:     Head:     Right side of head: No preauricular, posterior auricular or occipital adenopathy.     Left side of head: No preauricular, posterior auricular or occipital adenopathy.     Cervical: No cervical adenopathy.     Upper Body:     Right upper body: No supraclavicular or axillary adenopathy.     Left upper body: No supraclavicular or axillary adenopathy.     Lower Body: No right inguinal adenopathy. No left inguinal adenopathy.  Skin:    General: Skin is warm and dry.     Coloration: Skin is not pale.     Findings: No erythema or rash.     Comments: Dressing on abdomen, healing well per pt   Neurological:     Mental Status: He is alert and oriented to person, place, and time. Mental status is at baseline.  Psychiatric:        Mood and Affect: Mood normal.        Behavior: Behavior normal.        Thought Content: Thought content normal.        Judgment: Judgment normal.    Appointment on 08/12/2020  Component Date Value Ref Range Status  . Sodium 08/12/2020 133* 135 - 145 mmol/L Final  . Potassium 08/12/2020 4.1  3.5 - 5.1 mmol/L Final  . Chloride 08/12/2020 104  98 - 111 mmol/L Final  . CO2 08/12/2020 22  22 - 32 mmol/L Final  . Glucose, Bld 08/12/2020 123* 70 - 99 mg/dL Final   Glucose reference range applies only to samples taken after fasting for at least 8 hours.  . BUN 08/12/2020 14  8 - 23 mg/dL Final  . Creatinine, Ser 08/12/2020 0.86  0.61 - 1.24 mg/dL Final  . Calcium 08/12/2020 8.2* 8.9 - 10.3 mg/dL Final  . Total Protein 08/12/2020 6.4* 6.5 - 8.1 g/dL Final  . Albumin 08/12/2020 2.9* 3.5 - 5.0 g/dL Final  . AST 08/12/2020 25  15 - 41 U/L Final  . ALT 08/12/2020 12  0 - 44 U/L Final  . Alkaline Phosphatase 08/12/2020 76  38 - 126 U/L Final  . Total Bilirubin 08/12/2020 0.4  0.3 - 1.2 mg/dL Final  . GFR, Estimated 08/12/2020 >60  >60 mL/min Final   Comment: (NOTE) Calculated using the CKD-EPI Creatinine Equation (2021)   . Anion gap 08/12/2020 7  5 - 15 Final   Performed at [facility], [address], [place], [place] [address]  . WBC 08/12/2020 3.4* 4.0 - 10.5 K/uL Final  . RBC 08/12/2020 2.47* 4.22 - 5.81 MIL/uL Final  . Hemoglobin 08/12/2020 7.6* 13.0 - 17.0 g/dL Final  . HCT 08/12/2020 23.2* 39.0 - 52.0 % Final  . MCV 08/12/2020 93.9  80.0 - 100.0 fL Final  . MCH 08/12/2020 30.8  26.0 - 34.0 pg Final  . MCHC 08/12/2020 32.8  30.0 - 36.0 g/dL Final  . RDW 08/12/2020 15.1  11.5 - 15.5 % Final  . Platelets 08/12/2020 149* 150 - 400 K/uL Final  . nRBC 08/12/2020 0.0  0.0 - 0.2 % Final  . Neutrophils Relative % 08/12/2020 68  % Final  .  Neutro Abs 08/12/2020 2.4  1.7 - 7.7 K/uL Final  . Lymphocytes Relative 08/12/2020 16  % Final  . Lymphs Abs 08/12/2020 0.5* 0.7 - 4.0 K/uL Final  . Monocytes Relative 08/12/2020 13  % Final  . Monocytes Absolute 08/12/2020 0.4  0.1 - 1.0 K/uL Final  . Eosinophils Relative 08/12/2020 1  % Final  . Eosinophils Absolute 08/12/2020 0.0  0.0 - 0.5 K/uL Final  . Basophils Relative 08/12/2020 1  % Final  . Basophils Absolute 08/12/2020 0.0  0.0 - 0.1 K/uL Final  . Immature Granulocytes 08/12/2020 1  % Final  . Abs Immature Granulocytes 08/12/2020 0.02  0.00 - 0.07 K/uL Final   Performed at [facility], [address], [place], [place] [address]    Assessment:  Bryan [name] is a 80 y.o. male withrheumatoid arthritisand progressive anemiaover the past 2 years. He has been on Plaquenil and hydralazinewhich can cause anemia. Plaquenil was discontinued on 08/26/2016. He denies any exposure to radiation or toxins. He denies any prior history of hepatitis, prior transfusions or HIV risk factors. He denies any herbal products.  Diet is good. EGDon 09/24/2016 revealed patchy candidiasis in the entire esophagus. There were two non-bleeding angioectasias in the duodenum treated with argon plasma coagulation. Gastritis was biopsied. Pathology revealed mild chronic gastritis negative for H pylori, dysplasia and malignancy. Colonoscopyon 09/24/2016 revealed diverticulosis in the entire colon, one 3 mm polyp in the cecum, and non-bleeding internal hemorrhoids. Pathology from the cecal polyp revealed a tubular adenoma negative for high grade dysplasia and malignancy.   He denies any melena or hematochezia. He denies any hematuria.  CBC on 01/10/2018revealed a hematocrit of 29.5, hemoglobin 10.0, MCV 92.3, platelets 155,000, and WBC 4200. Stool was guaiac negative x 2 in 06/2016. Labs on 01/12/2018revealed a ferritin of 124, iron saturation 22% and TIBC of 370. B12 was  883 on 06/18/2014.  Work-up on 02/26/2018revealed a hematocrit of 31.7, hemoglobin 10.6, MCV 91, platelets 143,000, white count 3000 with an ANC of 1800. Absolute lymphocyte count was 700 (low). Creatinine was 1.57. LDHwas 269 (98-192). Normal labsincluded: uric acid, folate, Coombs, SPEP, and copper. Iron studiesincluded a saturation of 11% (low) and a TIBC of 454 (high) c/wiron deficiency anemia. Sed rate was 19. Retic was 0.9% (low). He is on oral ironwith vitamin C.  Chest, abdomen, and pelvic CTon  08/09/2016 revealed no acute findings or clear evidence of malignancy in the chest, abdomen or pelvis. There was asymmetric left rectal wall thickeningpossibly secondary to volume averaging with the prostate gland.  Bone marrow aspirate and biopsyon 11/04/2016 revealed a normocellular marrow (20%) with trilineage hematopoiesis and maturation. There was mild megakaryocytic atypia. Storage iron was present. There were rare ringed sideroblasts. Cytogeneticsand FISHwere normal (51, XY).  He was admitted to [facility] 10/14/2016 - 10/18/2016 for spinal stenosis of the lumbar region. He underwent redo decompressive lumbar laminotomy L5-S1 with radical foraminotomy the L5 and S1 nerve root with complete medial facetectomies. He underwentback surgery(revision fusion removal hardware and pedicle screw fixation) on 10/20/2017.  He was admitted to [facility] 10/28/2017 - 10/31/2017 with symptomatic anemia. He received 2 units of PRBCs. Hemoglobin improved from 6.2 to 8.1.  Hospital work-up revealed the following normal labs:ferritin (65), B12 (401), TSH, SPEP, haptoglobin. Testosteronewas <3 (low). Iron saturation was 7% with a TIBC of 317. LDH was 205 (98-192). Retic was 2.6%. Creatinine was 1.37 (CrCl 47.3 ml/min).  Peripheral smear revealedleukopenia with normal WBC morphology. Normocytic anemia with polychromasia, anisocytosis and rouleaux formation.   Work-up  on 11/08/2017 revealed a hematocrit of 27.2, hemoglobin 9.0, platelets 200,000, WBC 5800 with an [place] of 4100. Ferritin was 86. Iron saturation was 6% with a TIBC of 382. Sed rate was 56 (0-20). Cold agglutinins were negative. Reticulocyte count was 1.2%. SPEP revealed no monoclonal protein on 10/31/2017.  He received weekly Venoferx 2 (11/25/2017 - 12/02/2017), x 2 (12/23/2017 - 12/30/2017), and x 3 (12/11/2019 - 12/25/2019), and x 2 (01/22/2020 - 01/29/2020).  Ferritinhas been followed: 106 on 06/24/2017, 65 on 10/30/2017, 86 on 11/08/2017, 124 on 12/12/2017, 106 on 12/15/2017, 95 on 01/20/2018, 133 on 03/10/2018, 90 on 12/19/2018, 138 on 06/20/2019, 180 on 11/22/2019, 106 on 12/05/2019, 125 on 01/14/2020, and 205 on 02/12/2020. Iron saturationwas 6% on 06/24/2017, 6% on 01/20/2018, 11% on 12/05/2019 and 14% on 01/14/2020. Sed rate was 56 on 11/08/2017, 64 on 12/05/2017, and 40 on 11/22/2019.  He has a neuropathic ulcerof the right great toe. He has treated with Septra then switched to doxycycline. He underwent back surgeryon 10/20/2017.  He was admitted to [facility] from 11/12/2019-11/14/2019 for hemoptysis, pulmonary edema, and acute hypoxemic respiratory failure. CXR revealed symmetric bilateral airspace disease be secondary to pulmonary edema or pneumonia.   He was admitted to [facility] from 07/16/2020 - 07/28/2020 with a small bowel obstruction (SBO).  Abdomen CT showed evidence of a SBO with ileal transition point. He underwent a right hemicolectomy with terminal ileum removal. He received 2 units PRBCs.  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  The patient received the [place] COVID-19 vaccines in 05/2019.  Symptomatically, he has been tired, weak, and short of breath. His diarrhea has improved; he takes imodium occasionally. His stools were black the first 2 days he was at rehab.  Exam reveals a pale and fatigued appearing gentleman.  Plan: 1.   Labs today: CBC  with diff, CMP, ferritin, iron studies, retic 2.  Iron deficiency anemia  Hematocrit 35.4.  Hemoglobin 11.7.  MCV 95.2 on 05/19/2020.   Ferritin 135 with an iron saturation of 19% and a TIBC of 374.  Retic 3.0% (relatively low) and LDH 306 on 11/22/2019.   B12 was 482 and folate 76 on 12/05/2019.    Coombs and haptoglobin were negative/normal on 12/05/2019.    TSH was 1.476 on 12/05/2019.  Hematocrit 23.2.  Hemoglobin 7.6.  MCV 93.9 on 08/12/2020.  Ferritin 165 with an iron saturation of 8% and a TIBC of 239.  Review interim events.  He had blood loss associated with his surgery.  Given symptomatic anemia, discussed PRBC transfusion.   Patient in agreement.  Anticipate IV iron beginning next week.  Discuss follow-up with Dr Vicente Males (last seen 2018) 3. Thrombocytopenia Platelet count 109,000 on 05/19/2020 and 149,000 on 08/12/2020. He is on no new medications or herbal products.               Patient on Plaquenil.             Continue to monitor. 4.   Leukopenia  WBC 3400 with an ANC of 2400 today.  Patient on Plaquenil.  Bone marrow aspirate and biopsy previously discussed.  Continue to monitor and readdress depending on counts 5.   RN:  Please print out path from surgery at [facility] on 02/24 (bowel resection). 6.   RTC tomorrow for labs (sed rate, B12, folate) and 1 unit of PRBCs. 7.   RTC on 08/18/2020 for MD assessment, labs (CBC +/- others), and +/- Venofer.   I discussed the assessment and treatment plan with the patient.  The patient was provided an opportunity to ask questions and all were answered.  The patient agreed with the plan and demonstrated an understanding of the instructions.  The patient was advised to call back if the symptoms worsen or if the condition fails to improve as anticipated.  I provided 28 minutes of face-to-face time during this this encounter and > 50% was spent counseling as documented under my assessment and plan.  An  additional 10+ minutes were spent reviewing his chart (Epic and Care Everywhere) including notes, labs, and imaging studies.    Lequita Asal, MD, PhD 08/12/2020, 5:00 PM  I, Mirian Mo Tufford, am acting as a Education administrator for [employer]. [name], MD.   I, Cylis Ayars C. [name], MD, have reviewed the above documentation for accuracy and completeness, and I agree with the above.

## 2020-08-11 NOTE — Telephone Encounter (Signed)
t called reporting that he just got out of rehab and feels he needs a blood transfusion and would like his 08/21/20 appointment moved up to ASAP. Please advise

## 2020-08-12 ENCOUNTER — Other Ambulatory Visit: Payer: Self-pay | Admitting: Hematology and Oncology

## 2020-08-12 ENCOUNTER — Other Ambulatory Visit: Payer: Self-pay | Admitting: *Deleted

## 2020-08-12 ENCOUNTER — Telehealth: Payer: Self-pay

## 2020-08-12 ENCOUNTER — Encounter: Payer: Self-pay | Admitting: Hematology and Oncology

## 2020-08-12 ENCOUNTER — Other Ambulatory Visit: Payer: Self-pay

## 2020-08-12 ENCOUNTER — Inpatient Hospital Stay: Payer: Medicare Other | Attending: Hematology and Oncology

## 2020-08-12 ENCOUNTER — Inpatient Hospital Stay (HOSPITAL_BASED_OUTPATIENT_CLINIC_OR_DEPARTMENT_OTHER): Payer: Medicare Other | Admitting: Hematology and Oncology

## 2020-08-12 VITALS — BP 127/70 | HR 76 | Temp 97.2°F | Resp 18 | Wt 166.0 lb

## 2020-08-12 DIAGNOSIS — Z79899 Other long term (current) drug therapy: Secondary | ICD-10-CM | POA: Diagnosis not present

## 2020-08-12 DIAGNOSIS — D509 Iron deficiency anemia, unspecified: Secondary | ICD-10-CM | POA: Diagnosis not present

## 2020-08-12 DIAGNOSIS — R197 Diarrhea, unspecified: Secondary | ICD-10-CM | POA: Diagnosis not present

## 2020-08-12 DIAGNOSIS — D649 Anemia, unspecified: Secondary | ICD-10-CM

## 2020-08-12 DIAGNOSIS — D696 Thrombocytopenia, unspecified: Secondary | ICD-10-CM

## 2020-08-12 DIAGNOSIS — D72819 Decreased white blood cell count, unspecified: Secondary | ICD-10-CM | POA: Diagnosis not present

## 2020-08-12 DIAGNOSIS — D61818 Other pancytopenia: Secondary | ICD-10-CM

## 2020-08-12 LAB — COMPREHENSIVE METABOLIC PANEL WITH GFR
ALT: 12 U/L (ref 0–44)
AST: 25 U/L (ref 15–41)
Albumin: 2.9 g/dL — ABNORMAL LOW (ref 3.5–5.0)
Alkaline Phosphatase: 76 U/L (ref 38–126)
Anion gap: 7 (ref 5–15)
BUN: 14 mg/dL (ref 8–23)
CO2: 22 mmol/L (ref 22–32)
Calcium: 8.2 mg/dL — ABNORMAL LOW (ref 8.9–10.3)
Chloride: 104 mmol/L (ref 98–111)
Creatinine, Ser: 0.86 mg/dL (ref 0.61–1.24)
GFR, Estimated: >60 mL/min
Glucose, Bld: 123 mg/dL — ABNORMAL HIGH (ref 70–99)
Potassium: 4.1 mmol/L (ref 3.5–5.1)
Sodium: 133 mmol/L — ABNORMAL LOW (ref 135–145)
Total Bilirubin: 0.4 mg/dL (ref 0.3–1.2)
Total Protein: 6.4 g/dL — ABNORMAL LOW (ref 6.5–8.1)

## 2020-08-12 LAB — IRON AND TIBC
Iron: 20 ug/dL — ABNORMAL LOW (ref 45–182)
Saturation Ratios: 8 % — ABNORMAL LOW (ref 17.9–39.5)
TIBC: 239 ug/dL — ABNORMAL LOW (ref 250–450)
UIBC: 219 ug/dL

## 2020-08-12 LAB — RETICULOCYTES
Immature Retic Fract: 27.5 % — ABNORMAL HIGH (ref 2.3–15.9)
RBC.: 2.4 MIL/uL — ABNORMAL LOW (ref 4.22–5.81)
Retic Count, Absolute: 108 10*3/uL (ref 19.0–186.0)
Retic Ct Pct: 4.5 % — ABNORMAL HIGH (ref 0.4–3.1)

## 2020-08-12 LAB — CBC WITH DIFFERENTIAL/PLATELET
Abs Immature Granulocytes: 0.02 10*3/uL (ref 0.00–0.07)
Basophils Absolute: 0 10*3/uL (ref 0.0–0.1)
Basophils Relative: 1 %
Eosinophils Absolute: 0 10*3/uL (ref 0.0–0.5)
Eosinophils Relative: 1 %
HCT: 23.2 % — ABNORMAL LOW (ref 39.0–52.0)
Hemoglobin: 7.6 g/dL — ABNORMAL LOW (ref 13.0–17.0)
Immature Granulocytes: 1 %
Lymphocytes Relative: 16 %
Lymphs Abs: 0.5 10*3/uL — ABNORMAL LOW (ref 0.7–4.0)
MCH: 30.8 pg (ref 26.0–34.0)
MCHC: 32.8 g/dL (ref 30.0–36.0)
MCV: 93.9 fL (ref 80.0–100.0)
Monocytes Absolute: 0.4 10*3/uL (ref 0.1–1.0)
Monocytes Relative: 13 %
Neutro Abs: 2.4 10*3/uL (ref 1.7–7.7)
Neutrophils Relative %: 68 %
Platelets: 149 10*3/uL — ABNORMAL LOW (ref 150–400)
RBC: 2.47 MIL/uL — ABNORMAL LOW (ref 4.22–5.81)
RDW: 15.1 % (ref 11.5–15.5)
WBC: 3.4 10*3/uL — ABNORMAL LOW (ref 4.0–10.5)
nRBC: 0 % (ref 0.0–0.2)

## 2020-08-12 LAB — FERRITIN: Ferritin: 165 ng/mL (ref 24–336)

## 2020-08-12 LAB — SAMPLE TO BLOOD BANK

## 2020-08-12 NOTE — Telephone Encounter (Signed)
Transition Care Management Follow-up Telephone Call  Date of discharge and from where: 07/25/20 from [facility] to SNF. 08/11/20 from [facility] to home.  How have you been since you were released from the hospital? Patient states, "I saw Dr. [name] today because my hemoglobin dropped. I go back tomorrow for blood transfusion and iron transfusion next week.  ADLs tire me but [facility] starts tomorrow." Notes diarrhea has slowed, taking immodium as directed, staying hydrated. Denies pain, blood in stool, fever, chills, swelling at site. Reports site clean/warm/dry.   Any questions or concerns? No  Items Reviewed: Did the pt receive and understand the discharge instructions provided? Yes  Do not drive until you are pain free and have stopped all narcotic medications.  Avoid prolonged sitting or standing. Walking is your best form of exercise. Increase your activity gradually.  Do not lift anything greater than 10 lbs for the first two weeks  No swimming, hot tubs, whirpools, jacuzzis, etc, until your doctor says that you can.     Medications obtained and verified? Yes   Other? No   Any new allergies since your discharge? No  Dietary orders reviewed? Yes Do not drink alcohol while taking prescribed pain medications.  You may return to eating your usual diet.   Eat plenty of fruits and vegetables and drink plenty of fluids to prevent constipation   Do you have support at home? Yes   Home Care and Equipment/Supplies: Were home health services ordered? Yes If so, what is the name of the agency? [facility] up a time to come to the patient's home? Start tomorrow, unsure of weekly schedule.  Functional Questionnaire: (I = Independent and D = Dependent) ADLs: I  Bathing/Dressing- I  Meal Prep- D, Meals on Wheels and family  Eating- I  Maintaining continence- I  Transferring/Ambulation- cane, walker  Managing Meds- I  Follow up appointments  reviewed:   PCP Hospital f/u appt confirmed? Yes  Scheduled to see Dr. Caryl Bis on 08/22/20 @ 11:30.  [facility] f/u appt confirmed? Yes  Scheduled to see [facility] for staple removal.   Are transportation arrangements needed? No   If their condition worsens, is the pt aware to call PCP or go to the Emergency Dept.? Yes  Was the patient provided with contact information for the PCP's office or ED? Yes  Was to pt encouraged to call back with questions or concerns? Yes

## 2020-08-12 NOTE — Progress Notes (Signed)
Diarrhea has improved some, still taking imodium. Patient feels tired and weak, SOB

## 2020-08-13 ENCOUNTER — Inpatient Hospital Stay: Payer: Medicare Other

## 2020-08-13 ENCOUNTER — Other Ambulatory Visit: Payer: Self-pay

## 2020-08-13 DIAGNOSIS — D509 Iron deficiency anemia, unspecified: Secondary | ICD-10-CM | POA: Diagnosis not present

## 2020-08-13 DIAGNOSIS — D61818 Other pancytopenia: Secondary | ICD-10-CM

## 2020-08-13 DIAGNOSIS — D72819 Decreased white blood cell count, unspecified: Secondary | ICD-10-CM

## 2020-08-13 DIAGNOSIS — D696 Thrombocytopenia, unspecified: Secondary | ICD-10-CM

## 2020-08-13 DIAGNOSIS — D649 Anemia, unspecified: Secondary | ICD-10-CM

## 2020-08-13 LAB — FOLATE: Folate: 25 ng/mL (ref >5.9)

## 2020-08-13 LAB — VITAMIN B12: Vitamin B-12: 492 pg/mL (ref 180–914)

## 2020-08-13 LAB — SEDIMENTATION RATE: Sed Rate: 47 mm/hr — ABNORMAL HIGH (ref 0–20)

## 2020-08-13 MED ORDER — SODIUM CHLORIDE 0.9% IV SOLUTION
250.0000 mL | Freq: Once | INTRAVENOUS | Status: AC
  Administered 2020-08-13: 250 mL via INTRAVENOUS
  Filled 2020-08-13: qty 250

## 2020-08-13 MED ORDER — DIPHENHYDRAMINE HCL 25 MG PO CAPS
25.0000 mg | ORAL_CAPSULE | Freq: Once | ORAL | Status: AC
  Administered 2020-08-13: 25 mg via ORAL
  Filled 2020-08-13: qty 1

## 2020-08-13 MED ORDER — ACETAMINOPHEN 325 MG PO TABS
650.0000 mg | ORAL_TABLET | Freq: Once | ORAL | Status: AC
  Administered 2020-08-13: 650 mg via ORAL
  Filled 2020-08-13: qty 2

## 2020-08-13 NOTE — Telephone Encounter (Signed)
Reviewed

## 2020-08-14 LAB — BPAM RBC
Blood Product Expiration Date: 202203302359
ISSUE DATE / TIME: 202203231027
Unit Type and Rh: 9500

## 2020-08-14 LAB — TYPE AND SCREEN
ABO/RH(D): O POS
Antibody Screen: NEGATIVE
Unit division: 0

## 2020-08-14 NOTE — Progress Notes (Signed)
[facility]  [address] [place], [place] [address] Phone: [phone]  Fax: [phone]   Clinic Day:  08/18/2020    Referring physician: [name], MD  Chief Complaint: Bryan [name] is a 80 y.o. male with arthritis, iron deficiency anemia, and mild pancytopenia who is seen for 1 week assessment.  HPI:  The patient was last seen in the hematology clinic on 08/12/2020. At that time, he was seen for assessment after interval hospitalization at [facility] for SBO.  He underwent bowel resection right hemicolectomy on 07/17/2020.  He had 500 cc bloody ascites.  Symptomatically, he felt weak and short of breath.  Diarrhea had improved.  Hematocrit was 23.2, hemoglobin 7.6, MCV 93.9, platelets 149,000, WBC 3,400 (ANC 2,400). Sodium was 133. Calcium was 8.2 and albumin 2.9. Ferritin was 165 with an iron saturation of 8% and a TIBC of 239. Reticulocyte count was 4.5%. He received 1 unit PRBC the next day.  Labs on 08/13/2020 revealed a vitamin B12 of 492, folate 25.0, and sed rate 47 (0-20).  During the interim, he states "he has been better." He has had postprandial diarrhea over the past week. This has improved over the past couple of days, though he notes he has been eating less. He has been trying to eat a low fiber diet and drink a lot of fluids.   His energy level has not been good, but it is better today. His shortness of breath has improved. His neuropathy was bad a couple of days ago but it is good today.   Past Medical History:  Diagnosis Date  . Abnormal CT scan    Asymmetric left rectal wall thickening   . Anemia   . Anxiety   . Arrhythmia   . Basal cell carcinoma 04/28/2020   L forehead - ED&C 06/17/2020  . CHF (congestive heart failure) ([place])   . Chronic lower back pain   . Complication of anesthesia    Memory loss 09/2015  . Coronary artery disease   . Depression   . GERD (gastroesophageal reflux disease)   . Glaucoma   .  H/O thoracic aortic aneurysm repair   . Headache   . Heart murmur   . History of being hospitalized    memory lose kidney funtion down blood pressure up  . History of blood transfusion    "think he had one when he had heart valve OR" (10/14/2016); "none since" (10/19/2017)  . History of chicken pox   . Hyperlipidemia   . Hypertension   . Lumbar stenosis   . Murmur   . Neuropathy   . Osteoarthritis    worse in feet and ankles  . Paroxysmal A-fib ([place])   . Poor short term memory    takes Aricept  . Rheumatoid arthritis ([place])    "all over" (10/14/2016)  . Schizophrenia ([place])   . Seasonal allergies   . Thoracic spine pain   . Valvular heart disease   . Wears glasses    reading    Past Surgical History:  Procedure Laterality Date  . AORTIC VALVE REPLACEMENT  2007   [facility].  [place], [place]; "pig valve"  . APPENDECTOMY  2010  . BACK SURGERY    . CARDIAC VALVE REPLACEMENT    . CATARACT EXTRACTION W/ INTRAOCULAR LENS  IMPLANT, BILATERAL Bilateral 2012  . COLONOSCOPY WITH PROPOFOL N/A 09/24/2016   Procedure: COLONOSCOPY WITH PROPOFOL;  Surgeon: Jonathon Bellows, MD;  Location: [facility] ENDOSCOPY;  Service:  Endoscopy;  Laterality: N/A;  . ESOPHAGOGASTRODUODENOSCOPY (EGD) WITH PROPOFOL N/A 09/24/2016   Procedure: ESOPHAGOGASTRODUODENOSCOPY (EGD) WITH PROPOFOL;  Surgeon: Jonathon Bellows, MD;  Location: [facility] ENDOSCOPY;  Service: Endoscopy;  Laterality: N/A;  . GIVENS CAPSULE STUDY N/A 09/24/2016   Procedure: GIVENS CAPSULE STUDY;  Surgeon: Jonathon Bellows, MD;  Location: [facility] ENDOSCOPY;  Service: Endoscopy;  Laterality: N/A;  . HAMMER TOE SURGERY Right 10/09/2015   Procedure: HAMMER TOE REPAIR WITH K-WIRE FIXATION RIGHT SECOND TOE;  Surgeon: Albertine Patricia, DPM;  Location: [place];  Service: Podiatry;  Laterality: Right;  WITH LOCAL  . HARDWARE REMOVAL N/A 11/02/2019   Procedure: Removal of hardware thoracic spine;  Surgeon: Kary Kos, MD;  Location: [place];  Service: Neurosurgery;   Laterality: N/A;  posterior  . INGUINAL HERNIA REPAIR Right 05/05/2018   Medium Bard PerFix plug.  Surgeon: Robert Bellow, MD;  Location: [facility] ORS;  Service: General;  Laterality: Right;  . IR VERTEBROPLASTY CERV/THOR BX INC UNI/BIL INC/INJECT/IMAGING  10/27/2018  . JOINT REPLACEMENT Left    left total knee  . LAMINECTOMY WITH POSTERIOR LATERAL ARTHRODESIS LEVEL 3 N/A 09/28/2017   Procedure: LUMBAR  POSTEROR  FUSION REVISION - LUMBAR ONE -TWO, LUMBAR TWO -THREE,THREE-FOUR ,BILATERAL ARTHRODESIS REMOVAL LUMBAR THREE HARDWARE;  Surgeon: Kary Kos, MD;  Location: [place];  Service: Neurosurgery;  Laterality: N/A;  . LAMINECTOMY WITH POSTERIOR LATERAL ARTHRODESIS LEVEL 3 N/A 10/20/2017   Procedure: Revision fusion and removal of hardware Lumbar one, Pedicle screw fixation thoracic ten-lumbar two, thoracic nine-ten laminotomy, Posterior Lumbar Arthrodesis thoracic ten-lumbar two ;  Surgeon: Kary Kos, MD;  Location: [place];  Service: Neurosurgery;  Laterality: N/A;  . LAPAROSCOPIC CHOLECYSTECTOMY  2010  . LUMBAR FUSION Right 06/08/2017   LUMBAR THREE-FOUR, LUMBAR FOUR-FIVE POSTEROLATERAL ARTHRODESIS WITH RIGHT LUMBAR FOUR-FIVE LAMINECTOMY/FORAMINOTOMY  . LUMBAR LAMINECTOMY/DECOMPRESSION MICRODISCECTOMY Right 03/08/2016   Procedure: Laminectomy and Foraminotomy - Lumbar Five-Sacral One Right;  Surgeon: Kary Kos, MD;  Location: [place];  Service: Neurosurgery;  Laterality: Right;  Right  . MULTIPLE TOOTH EXTRACTIONS     "3"  . POSTERIOR LUMBAR FUSION  10/14/2016   L5-S1  . REPLACEMENT TOTAL KNEE Left 2003  . THORACIC AORTIC ANEURYSM REPAIR  2010    Family History  Problem Relation Age of Onset  . Heart failure Mother   . Hypertension Mother   . Asthma Mother   . Osteoporosis Mother   . Heart attack Father 33       MI  . Hypertension Sister   . Prostate cancer Neg Hx   . Bladder Cancer Neg Hx   . Kidney cancer Neg Hx   . Colon cancer Neg Hx     Social History:  reports that he quit  smoking about 39 years ago. His smoking use included cigarettes. He has a 32.00 pack-year smoking history. He has never used smokeless tobacco. He reports current alcohol use. He reports that he does not use drugs. He smoked 1 pack a day x 12 years. He stopped smoking 30 years ago. He drinks a glass of wine occasionally. He is a Software engineer. [place] His wife passed away in August 06, 2018. His friend is Stanford [place]. The patient is alone today.   Allergies:  Allergies  Allergen Reactions  . Penicillins Hives, Swelling and Other (See Comments)    SWELLING REACTION UNSPECIFIED PATIENT HAS TAKEN AMOXICILLIN ON MED HX FROM [facility] PATIENT HAS HAD A PCN REACTION WITH IMMEDIATE RASH, FACIAL/TONGUE/THROAT SWELLING,  SOB, OR LIGHTHEADEDNESS WITH HYPOTENSION:  #  #  #  YES  #  #  #   Has patient had a PCN reaction causing severe rash involving mucus membranes or skin necrosis: No Has patient had a PCN reaction that required hospitalization No Has patient had a PCN reaction occurring within the last 10 years: No  . Demerol [Meperidine] Hives and Nausea And Vomiting  . Other     Current Medications: Current Outpatient Medications  Medication Sig Dispense Refill  . amLODipine (NORVASC) 5 MG tablet Take 1 tablet (5 mg total) by mouth daily. 90 tablet 3  . ARIPiprazole (ABILIFY) 5 MG tablet Take by mouth.    Marland Kitchen atorvastatin (LIPITOR) 40 MG tablet TAKE ONE TABLET BY MOUTH EVERY DAY 90 tablet 3  . calcium carbonate (OS-CAL - DOSED IN MG OF ELEMENTAL CALCIUM) 1250 (500 Ca) MG tablet Take 1 tablet by mouth daily with lunch.    . cetirizine (ZYRTEC) 10 MG tablet Take 10 mg by mouth daily.    Marland Kitchen ELIQUIS 5 MG TABS tablet TAKE 1 TABLET BY MOUTH TWICE DAILY 180 tablet 2  . fenofibrate 160 MG tablet TAKE ONE TABLET AT BEDTIME 90 tablet 3  . FLUoxetine (PROZAC) 20 MG capsule TAKE 1 CAPSULE BY MOUTH ONCE DAILY 90 capsule 1  . furosemide (LASIX) 40 MG tablet Take 40 mg by  mouth as needed.    . gabapentin (NEURONTIN) 300 MG capsule TAKE 1 CAPSULE BY MOUTH 3 TIMES DAILY. 270 capsule 1  . hydrALAZINE (APRESOLINE) 25 MG tablet Take by mouth.    Marland Kitchen HYDROcodone-acetaminophen (NORCO) 10-325 MG tablet Take 1 tablet by mouth 4 (four) times daily as needed for moderate pain.     . hydrocortisone cream 1 % Apply 1 application topically daily as needed for itching.    . hydroxychloroquine (PLAQUENIL) 200 MG tablet Take 200 mg by mouth 3 (three) times a week. Tue, Thurs, and Sat    . lisinopril (ZESTRIL) 20 MG tablet Take 1.5 tablets (30 mg total) by mouth every evening. 135 tablet 1  . Multiple Vitamins-Minerals (PRESERVISION AREDS 2) CAPS Take 1 capsule by mouth 2 (two) times daily.    Marland Kitchen testosterone cypionate (DEPOTESTOSTERONE CYPIONATE) 200 MG/ML injection Inject 0.25 cc every 2 weeks 10 mL 0  . traZODone (DESYREL) 50 MG tablet TAKE 1 TABLET AT BEDTIME MAY TAKE 2 TABLETS AT BEDTIME IF NEEDED 90 tablet 1  . vitamin B-12 (CYANOCOBALAMIN) 1000 MCG tablet Take 1,000 mcg by mouth daily with lunch.     . Vitamin D, Ergocalciferol, (DRISDOL) 1.25 MG (50000 UNIT) CAPS capsule Take 50,000 Units by mouth once a week.    . vitamin E 180 MG (400 UNITS) capsule Take 800 Units by mouth daily with lunch.    . diphenhydrAMINE (SOMINEX) 25 MG tablet Take by mouth. (Patient not taking: No sig reported)    . donepezil (ARICEPT) 5 MG tablet SMARTSIG:1 Tablet(s) By Mouth Every Evening (Patient not taking: No sig reported)    . folic acid (FOLVITE) 1 MG tablet Take 1 mg by mouth in the morning and at bedtime.  (Patient not taking: No sig reported)     No current facility-administered medications for this visit.   Facility-Administered Medications Ordered in Other Visits  Medication Dose Route Frequency Provider Last Rate Last Admin  . 0.9 %  sodium chloride infusion   Intravenous Continuous Lequita Asal, MD 10 mL/hr at 12/11/19 1123 New Bag at 12/11/19 1123    Review of  Systems   Constitutional: Positive for malaise/fatigue (low energy, improved today) and weight loss (11 lbs). Negative for chills, diaphoresis and fever.       "Has been better."  HENT: Negative.  Negative for congestion, ear discharge, ear pain, hearing loss, nosebleeds, sinus pain, sore throat and tinnitus.   Eyes: Negative.  Negative for blurred vision, double vision and photophobia.  Respiratory: Positive for shortness of breath (improved). Negative for cough, hemoptysis and sputum production.   Cardiovascular: Negative.  Negative for chest pain, palpitations, orthopnea and leg swelling.  Gastrointestinal: Positive for diarrhea. Negative for abdominal pain, blood in stool, constipation, heartburn, melena, nausea and vomiting.  Genitourinary: Negative.  Negative for dysuria, frequency, hematuria and urgency.  Musculoskeletal: Positive for joint pain (rheumatoid arthritis, right knee). Negative for back pain, myalgias and neck pain.  Skin: Negative for itching and rash.  Neurological: Positive for sensory change (neuropathy, on Neurontin). Negative for dizziness, tingling, weakness and headaches.  Endo/Heme/Allergies: Does not bruise/bleed easily.  Psychiatric/Behavioral: Positive for memory loss (on aricept). Negative for depression (lost his wife in 06/2018) and substance abuse. The patient is not nervous/anxious and does not have insomnia.   All other systems reviewed and are negative.  Performance status (ECOG):  1-2  Vitals Blood pressure 130/79, pulse 75, temperature 97.8 F (36.6 C), resp. rate 20, weight 155 lb 12.1 oz (70.7 kg), SpO2 100 %.   Physical Exam Vitals and nursing note reviewed.  Constitutional:      General: He is not in acute distress.    Appearance: He is well-developed. He is not diaphoretic.     Comments: Rolling walker by his side. Requires minimal assistance onto exam table.  HENT:     Head: Normocephalic and atraumatic.     Comments: White hair and beard.     Mouth/Throat:     Mouth: Mucous membranes are moist.     Pharynx: Oropharynx is clear. No oropharyngeal exudate.  Eyes:     General: No scleral icterus.    Extraocular Movements: Extraocular movements intact.     Conjunctiva/sclera: Conjunctivae normal.     Pupils: Pupils are equal, round, and reactive to light.     Comments: Glasses.  Hazel eyes.  Cardiovascular:     Rate and Rhythm: Normal rate and regular rhythm.     Heart sounds: Normal heart sounds. No murmur heard.   Pulmonary:     Effort: No respiratory distress.     Breath sounds: No wheezing or rales.  Chest:  Breasts:     Right: No axillary adenopathy or supraclavicular adenopathy.     Left: No axillary adenopathy or supraclavicular adenopathy.    Abdominal:     General: Bowel sounds are normal. There is no distension.     Palpations: Abdomen is soft. There is no mass.     Tenderness: There is no abdominal tenderness. There is no guarding or rebound.  Musculoskeletal:        General: No swelling or tenderness. Normal range of motion.     Cervical back: Normal range of motion.     Right lower leg: No edema.     Left lower leg: No edema.  Lymphadenopathy:     Head:     Right side of head: No preauricular, posterior auricular or occipital adenopathy.     Left side of head: No preauricular, posterior auricular or occipital adenopathy.     Cervical: No cervical adenopathy.     Upper Body:     Right upper  body: No supraclavicular or axillary adenopathy.     Left upper body: No supraclavicular or axillary adenopathy.     Lower Body: No right inguinal adenopathy. No left inguinal adenopathy.  Skin:    General: Skin is warm and dry.     Coloration: Skin is not pale.     Findings: No erythema or rash.     Comments: Dressing on abdomen. Area is healing well. Minimal drainage.  Neurological:     Mental Status: He is alert and oriented to person, place, and time. Mental status is at baseline.  Psychiatric:        Mood  and Affect: Mood normal.        Behavior: Behavior normal.        Thought Content: Thought content normal.        Judgment: Judgment normal.    Appointment on 08/18/2020  Component Date Value Ref Range Status  . WBC 08/18/2020 3.5* 4.0 - 10.5 K/uL Final  . RBC 08/18/2020 3.34* 4.22 - 5.81 MIL/uL Final  . Hemoglobin 08/18/2020 10.0* 13.0 - 17.0 g/dL Final  . HCT 08/18/2020 30.7* 39.0 - 52.0 % Final  . MCV 08/18/2020 91.9  80.0 - 100.0 fL Final  . MCH 08/18/2020 29.9  26.0 - 34.0 pg Final  . MCHC 08/18/2020 32.6  30.0 - 36.0 g/dL Final  . RDW 08/18/2020 15.9* 11.5 - 15.5 % Final  . Platelets 08/18/2020 173  150 - 400 K/uL Final  . nRBC 08/18/2020 0.0  0.0 - 0.2 % Final   Performed at [facility], [address] [place], [place] [address]    Assessment:  ROGUE RAFALSKI is a 80 y.o. male withrheumatoid arthritisand progressive anemiaover the past 2 years. He has been on Plaquenil and hydralazinewhich can cause anemia. Plaquenil was discontinued on 08/26/2016. He denies any exposure to radiation or toxins. He denies any prior history of hepatitis, prior transfusions or HIV risk factors. He denies any herbal products.  Diet is good. EGDon 09/24/2016 revealed patchy candidiasis in the entire esophagus. There were two non-bleeding angioectasias in the duodenum treated with argon plasma coagulation. Gastritis was biopsied. Pathology revealed mild chronic gastritis negative for H pylori, dysplasia and malignancy. Colonoscopyon 09/24/2016 revealed diverticulosis in the entire colon, one 3 mm polyp in the cecum, and non-bleeding internal hemorrhoids. Pathology from the cecal polyp revealed a tubular adenoma negative for high grade dysplasia and malignancy.   He denies any melena or hematochezia. He denies any hematuria.  CBC on 01/10/2018revealed a hematocrit of 29.5, hemoglobin 10.0, MCV 92.3, platelets 155,000, and WBC 4200. Stool was guaiac negative x 2  in 06/2016. Labs on 01/12/2018revealed a ferritin of 124, iron saturation 22% and TIBC of 370. B12 was 883 on 06/18/2014.  Work-up on 02/26/2018revealed a hematocrit of 31.7, hemoglobin 10.6, MCV 91, platelets 143,000, white count 3000 with an ANC of 1800. Absolute lymphocyte count was 700 (low). Creatinine was 1.57. LDHwas 269 (98-192). Normal labsincluded: uric acid, folate, [place], SPEP, and copper. Iron studiesincluded a saturation of 11% (low) and a TIBC of 454 (high) c/wiron deficiency anemia. Sed rate was 19. Retic was 0.9% (low). He is on oral ironwith vitamin C.  Chest, abdomen, and pelvic CTon 08/09/2016 revealed no acute findings or clear evidence of malignancy in the chest, abdomen or pelvis. There was asymmetric left rectal wall thickeningpossibly secondary to volume averaging with the prostate gland.  Bone marrow aspirate and biopsyon 11/04/2016 revealed a normocellular marrow (20%) with trilineage hematopoiesis and  maturation. There was mild megakaryocytic atypia. Storage iron was present. There were rare ringed sideroblasts. Cytogeneticsand FISHwere normal (25, XY).  He was admitted to [facility] 10/14/2016 - 10/18/2016 for spinal stenosis of the lumbar region. He underwent redo decompressive lumbar laminotomy L5-S1 with radical foraminotomy the L5 and S1 nerve root with complete medial facetectomies. He underwentback surgery(revision fusion removal hardware and pedicle screw fixation) on 10/20/2017.  He was admitted to [place] 10/28/2017 - 10/31/2017 with symptomatic anemia. He received 2 units of PRBCs. Hemoglobin improved from 6.2 to 8.1.  Hospital work-up revealed the following normal labs:ferritin (65), B12 (401), TSH, SPEP, haptoglobin. Testosteronewas <3 (low). Iron saturation was 7% with a TIBC of 317. LDH was 205 (98-192). Retic was 2.6%. Creatinine was 1.37 (CrCl 47.3 ml/min).  Peripheral smear revealedleukopenia with normal  WBC morphology. Normocytic anemia with polychromasia, anisocytosis and rouleaux formation.   Work-up on 11/08/2017 revealed a hematocrit of 27.2, hemoglobin 9.0, platelets 200,000, WBC 5800 with an [place] of 4100. Ferritin was 86. Iron saturation was 6% with a TIBC of 382. Sed rate was 56 (0-20). Cold agglutinins were negative. Reticulocyte count was 1.2%. SPEP revealed no monoclonal protein on 10/31/2017.  He received weekly Venoferx 2 (11/25/2017 - 12/02/2017), x 2 (12/23/2017 - 12/30/2017), and x 3 (12/11/2019 - 12/25/2019), and x 2 (01/22/2020 - 01/29/2020).  Ferritinhas been followed: 106 on 06/24/2017, 65 on 10/30/2017, 86 on 11/08/2017, 124 on 12/12/2017, 106 on 12/15/2017, 95 on 01/20/2018, 133 on 03/10/2018, 90 on 12/19/2018, 138 on 06/20/2019, 180 on 11/22/2019, 106 on 12/05/2019, 125 on 01/14/2020, 205 on 02/12/2020, 135 on 05/19/2020, and 165 on 08/12/2020. Iron saturationwas 6% on 06/24/2017, 6% on 01/20/2018, 11% on 12/05/2019, 14% on 01/14/2020, 19% on 05/19/2020, and 8% on 08/12/2020. Sed rate was 56 on 11/08/2017, 64 on 12/05/2017, 40 on 11/22/2019, 17 on 01/14/2020, 10 on 05/19/2020 and 47 on 08/13/2020.  He has a neuropathic ulcerof the right great toe. He has treated with Septra then switched to doxycycline. He underwent back surgeryon 10/20/2017.  The patient was admitted to [facility] from 11/12/2019-11/14/2019 for hemoptysis, pulmonary edema, and acute hypoxemic respiratory failure. CXR revealed symmetric bilateral airspace disease be secondary to pulmonary edema or pneumonia.   The patient received the [place] COVID-19 vaccines in 05/2019.  Symptomatically, his energy level is better. He has had postprandial diarrhea over the past week. He has been eating less. Shortness of breath has improved. He denies any bleeding.  Plan: 1.   Labs today: CBC, soluble transferrin receptor, hold tube. 2.  Iron deficiency anemia  Hematocrit 35.4.  Hemoglobin 11.7.  MCV 95.2 on  05/19/2020.   Ferritin 135 with an iron saturation of 19% and a TIBC of 374.  Hematocrit 23.2.  Hemoglobin   7.6.  MCV 93.9 on 08/12/2020.   Ferritin 165 with an iron saturation of 8% and a TIBC of 239.   B12 and folate were normal.  Sed rate was 47 (high).   He received 1 unit of PRBCs on 08/13/2020.  Hematocrit 30.7.  Hemoglobin 10.0.  MCV 91.9 on 08/18/2020.   Check soluble transferrin receptor- if elevated trial of Venofer.  No Venofer today.  Continue to monitor. 3. Thrombocytopenia Platelet count 173,000 on 08/18/2020. He is on no new medications or herbal products.               Patient on Plaquenil.             Continue to monitor. 4.   Leukopenia  WBC 3000 with an  [place] of 1900 on 05/19/2020.  Patient on Plaquenil.  Plan to readdress bone marrow aspirate and biopsy if counts decline. 5.   Diarrhea  Stool for C diff and GI panel by PCR. 6.   No Venofer today. 7.   RTC in 3 weeks for labs (CBC, ferritin, iron studies). 8.   RTC in 6 weeks for MD assessment, labs (CBC with diff, ferritin, iron studies, sed rate- day before) and +/- Venofer.   I discussed the assessment and treatment plan with the patient.  The patient was provided an opportunity to ask questions and all were answered.  The patient agreed with the plan and demonstrated an understanding of the instructions.  The patient was advised to call back if the symptoms worsen or if the condition fails to improve as anticipated.  I provided 19 minutes of face-to-face time during this this encounter and > 50% was spent counseling as documented under my assessment and plan.  An additional 5 minutes were spent reviewing his chart (Epic and Care Everywhere) including notes, labs, and imaging studies.    Lequita Asal, MD, PhD 08/18/2020, 5:00 PM  I, Mirian Mo Tufford, am acting as a Education administrator for [employer]. [name], MD.   I, [place] C. [name], MD, have reviewed the above documentation for accuracy  and completeness, and I agree with the above.

## 2020-08-16 LAB — PREPARE RBC (CROSSMATCH)

## 2020-08-17 ENCOUNTER — Other Ambulatory Visit: Payer: Self-pay | Admitting: Hematology and Oncology

## 2020-08-17 DIAGNOSIS — D649 Anemia, unspecified: Secondary | ICD-10-CM

## 2020-08-18 ENCOUNTER — Inpatient Hospital Stay: Payer: Medicare Other

## 2020-08-18 ENCOUNTER — Other Ambulatory Visit: Payer: Self-pay

## 2020-08-18 ENCOUNTER — Encounter: Payer: Self-pay | Admitting: Hematology and Oncology

## 2020-08-18 ENCOUNTER — Inpatient Hospital Stay (HOSPITAL_BASED_OUTPATIENT_CLINIC_OR_DEPARTMENT_OTHER): Payer: Medicare Other | Admitting: Hematology and Oncology

## 2020-08-18 VITALS — BP 130/79 | HR 75 | Temp 97.8°F | Resp 20 | Wt 155.8 lb

## 2020-08-18 DIAGNOSIS — D72819 Decreased white blood cell count, unspecified: Secondary | ICD-10-CM

## 2020-08-18 DIAGNOSIS — D696 Thrombocytopenia, unspecified: Secondary | ICD-10-CM | POA: Diagnosis not present

## 2020-08-18 DIAGNOSIS — D509 Iron deficiency anemia, unspecified: Secondary | ICD-10-CM | POA: Diagnosis not present

## 2020-08-18 DIAGNOSIS — R197 Diarrhea, unspecified: Secondary | ICD-10-CM | POA: Diagnosis not present

## 2020-08-18 DIAGNOSIS — D649 Anemia, unspecified: Secondary | ICD-10-CM

## 2020-08-18 LAB — CBC
HCT: 30.7 % — ABNORMAL LOW (ref 39.0–52.0)
Hemoglobin: 10 g/dL — ABNORMAL LOW (ref 13.0–17.0)
MCH: 29.9 pg (ref 26.0–34.0)
MCHC: 32.6 g/dL (ref 30.0–36.0)
MCV: 91.9 fL (ref 80.0–100.0)
Platelets: 173 10*3/uL (ref 150–400)
RBC: 3.34 MIL/uL — ABNORMAL LOW (ref 4.22–5.81)
RDW: 15.9 % — ABNORMAL HIGH (ref 11.5–15.5)
WBC: 3.5 10*3/uL — ABNORMAL LOW (ref 4.0–10.5)
nRBC: 0 % (ref 0.0–0.2)

## 2020-08-18 LAB — SAMPLE TO BLOOD BANK

## 2020-08-18 NOTE — Progress Notes (Signed)
Patient states he has been having night sweats for the last week and a half. These has caused him to change his shirt throughout the night. Patient stats that he is having frequent diarrhea since his surgrey.Patientstates he has had diarrhea after every meal.

## 2020-08-18 NOTE — Patient Instructions (Signed)
  Begin ferrous sulfate 325 mg 1 tablet with OJ or vitamin c.

## 2020-08-19 ENCOUNTER — Encounter: Payer: Self-pay | Admitting: Hematology and Oncology

## 2020-08-19 ENCOUNTER — Other Ambulatory Visit: Payer: Self-pay | Admitting: Family Medicine

## 2020-08-19 DIAGNOSIS — F4381 Prolonged grief disorder: Secondary | ICD-10-CM

## 2020-08-19 DIAGNOSIS — F4329 Adjustment disorder with other symptoms: Secondary | ICD-10-CM

## 2020-08-19 DIAGNOSIS — F419 Anxiety disorder, unspecified: Secondary | ICD-10-CM

## 2020-08-19 LAB — SOLUBLE TRANSFERRIN RECEPTOR: Transferrin Receptor: 38.1 nmol/L — ABNORMAL HIGH (ref 12.2–27.3)

## 2020-08-20 ENCOUNTER — Other Ambulatory Visit: Payer: Self-pay

## 2020-08-20 DIAGNOSIS — R197 Diarrhea, unspecified: Secondary | ICD-10-CM

## 2020-08-20 DIAGNOSIS — D509 Iron deficiency anemia, unspecified: Secondary | ICD-10-CM | POA: Diagnosis not present

## 2020-08-20 LAB — C DIFFICILE QUICK SCREEN W PCR REFLEX
C Diff antigen: NEGATIVE
C Diff interpretation: NOT DETECTED
C Diff toxin: NEGATIVE

## 2020-08-20 LAB — GASTROINTESTINAL PANEL BY PCR, STOOL (REPLACES STOOL CULTURE)

## 2020-08-20 NOTE — Telephone Encounter (Signed)
Spoke with Bryan [name] today. She received the [employer]. She is waiting on her sister to let her know the home health agency to send the lab orders to. She will call our office back once this information is available.

## 2020-08-21 ENCOUNTER — Telehealth: Payer: Self-pay | Admitting: *Deleted

## 2020-08-21 ENCOUNTER — Ambulatory Visit: Payer: Medicare Other | Admitting: Hematology and Oncology

## 2020-08-21 ENCOUNTER — Other Ambulatory Visit: Payer: Medicare Other

## 2020-08-21 NOTE — Telephone Encounter (Signed)
Labs orders faxed to [facility] at [phone]. Fax confirmation [email or web address].

## 2020-08-21 NOTE — Telephone Encounter (Signed)
Labs orders faxed to [facility] at [phone]. Fax confirmation [email or web address].

## 2020-08-22 ENCOUNTER — Ambulatory Visit (INDEPENDENT_AMBULATORY_CARE_PROVIDER_SITE_OTHER): Payer: Medicare Other | Admitting: Family Medicine

## 2020-08-22 ENCOUNTER — Other Ambulatory Visit: Payer: Self-pay

## 2020-08-22 ENCOUNTER — Encounter: Payer: Self-pay | Admitting: Family Medicine

## 2020-08-22 VITALS — BP 114/64 | HR 83 | Temp 97.6°F | Ht 71.0 in | Wt 155.2 lb

## 2020-08-22 DIAGNOSIS — E785 Hyperlipidemia, unspecified: Secondary | ICD-10-CM

## 2020-08-22 DIAGNOSIS — E43 Unspecified severe protein-calorie malnutrition: Secondary | ICD-10-CM

## 2020-08-22 DIAGNOSIS — D649 Anemia, unspecified: Secondary | ICD-10-CM

## 2020-08-22 DIAGNOSIS — R197 Diarrhea, unspecified: Secondary | ICD-10-CM

## 2020-08-22 DIAGNOSIS — M51369 Other intervertebral disc degeneration, lumbar region without mention of lumbar back pain or lower extremity pain: Secondary | ICD-10-CM

## 2020-08-22 DIAGNOSIS — K559 Vascular disorder of intestine, unspecified: Secondary | ICD-10-CM | POA: Diagnosis not present

## 2020-08-22 DIAGNOSIS — M5136 Other intervertebral disc degeneration, lumbar region: Secondary | ICD-10-CM

## 2020-08-22 DIAGNOSIS — E46 Unspecified protein-calorie malnutrition: Secondary | ICD-10-CM | POA: Insufficient documentation

## 2020-08-22 LAB — LIPID PANEL
Cholesterol: 88 mg/dL (ref 0–200)
HDL: 38.4 mg/dL — ABNORMAL LOW (ref >39.00)
LDL Cholesterol: 23 mg/dL (ref 0–99)
NonHDL: 49.34
Total CHOL/HDL Ratio: 2
Triglycerides: 132 mg/dL (ref 0.0–149.0)
VLDL: 26.4 mg/dL (ref 0.0–40.0)

## 2020-08-22 LAB — BASIC METABOLIC PANEL
BUN: 21 mg/dL (ref 6–23)
CO2: 24 mEq/L (ref 19–32)
Calcium: 8.7 mg/dL (ref 8.4–10.5)
Chloride: 103 mEq/L (ref 96–112)
Creatinine, Ser: 1.1 mg/dL (ref 0.40–1.50)
GFR: 62.67 mL/min (ref >60.00)
Glucose, Bld: 100 mg/dL — ABNORMAL HIGH (ref 70–99)
Potassium: 4.1 mEq/L (ref 3.5–5.1)
Sodium: 137 mEq/L (ref 135–145)

## 2020-08-22 NOTE — Progress Notes (Signed)
[name], MD Phone: [phone]  Bryan [name] is a 80 y.o. male who presents today for hospital follow-up.   Ischemic enteritis: The patient presented to the ED with a 2-day history of abdominal pain, nausea, and vomiting.  He had a CT scan that demonstrated evidence of a small bowel obstruction with ileal transition point.  He was tachycardic and hypotensive though this was responsive to a fluid bolus.  He was admitted and taken to the OR for bowel resection.  He underwent right hemicolectomy with terminal ileum removal.  He had a necrotic distal ileum and hemorrhagic necrosis.  Pathology revealed ischemic enteritis.  He had no complications intraoperatively and did quite well after surgery.  His diet was advanced and by the time of discharge he was tolerating a regular diet.  Since discharge he has had intermittent significant diarrhea.  He had a negative C. difficile and GI pathogen panel recently to rule out infectious causes of diarrhea.  He has followed up with general surgery.  He has also followed up with hematology and his hemoglobin was noted to be improving following outpatient blood transfusion.  The patient and his friend note that he was having quite a bit of shortness of breath before receiving his blood transfusion though this has improved after the transfusion.  He has been trying to stay adequately hydrated.  Protein levels were noted to be low recently.  He has not added any meal supplementation.  He has chronic pain in his legs and back for which she has intermittently been taking hydrocodone.  He recently ran out.  He did follow-up with a new pain specialist though he saw a PA and notes he was treated quite poorly at [facility] by that PA.  He is not going to go back to them and does have a appointment with his prior pain specialist at [facility] neurosurgery in [place].  He has an appointment with GI next week.  He notes no abdominal pain.  He is passing gas.  No  fevers.  No nausea.  He does note slight serosanguineous drainage from granulation tissue around his umbilicus.  Per general surgery office note this was treated with silver nitrate when he followed up with general surgery.  Discharge summary reviewed.  Medications reviewed.  Social History   Tobacco Use  Smoking Status Former Smoker  . Packs/day: 1.00  . Years: 32.00  . Pack years: 32.00  . Types: Cigarettes  . Quit date: 07/05/1981  . Years since quitting: 39.1  Smokeless Tobacco Never Used    Current Outpatient Medications on File Prior to Visit  Medication Sig Dispense Refill  . amLODipine (NORVASC) 5 MG tablet Take 1 tablet (5 mg total) by mouth daily. 90 tablet 3  . ARIPiprazole (ABILIFY) 5 MG tablet Take by mouth.    Marland Kitchen atorvastatin (LIPITOR) 40 MG tablet TAKE ONE TABLET BY MOUTH EVERY DAY 90 tablet 3  . calcium carbonate (OS-CAL - DOSED IN MG OF ELEMENTAL CALCIUM) 1250 (500 Ca) MG tablet Take 1 tablet by mouth daily with lunch.    . cetirizine (ZYRTEC) 10 MG tablet Take 10 mg by mouth daily.    Marland Kitchen ELIQUIS 5 MG TABS tablet TAKE 1 TABLET BY MOUTH TWICE DAILY 180 tablet 2  . fenofibrate 160 MG tablet TAKE ONE TABLET AT BEDTIME 90 tablet 3  . FLUoxetine (PROZAC) 20 MG capsule TAKE 1 CAPSULE BY MOUTH ONCE DAILY 90 capsule 1  . furosemide (LASIX) 40 MG tablet Take 40 mg by mouth  as needed.    . gabapentin (NEURONTIN) 300 MG capsule TAKE 1 CAPSULE BY MOUTH 3 TIMES DAILY. 270 capsule 1  . hydrALAZINE (APRESOLINE) 25 MG tablet Take by mouth.    Marland Kitchen HYDROcodone-acetaminophen (NORCO) 10-325 MG tablet Take 1 tablet by mouth 4 (four) times daily as needed for moderate pain.     . hydrocortisone cream 1 % Apply 1 application topically daily as needed for itching.    . hydroxychloroquine (PLAQUENIL) 200 MG tablet Take 200 mg by mouth 3 (three) times a week. Tue, Thurs, and Sat    . lisinopril (ZESTRIL) 20 MG tablet Take 1.5 tablets (30 mg total) by mouth every evening. 135 tablet 1  . Multiple  Vitamins-Minerals (PRESERVISION AREDS 2) CAPS Take 1 capsule by mouth 2 (two) times daily.    . traZODone (DESYREL) 50 MG tablet TAKE 1 TABLET AT BEDTIME MAY TAKE 2 TABLETS AT BEDTIME IF NEEDED 90 tablet 1  . vitamin B-12 (CYANOCOBALAMIN) 1000 MCG tablet Take 1,000 mcg by mouth daily with lunch.     . Vitamin D, Ergocalciferol, (DRISDOL) 1.25 MG (50000 UNIT) CAPS capsule Take 50,000 Units by mouth once a week.    . vitamin E 180 MG (400 UNITS) capsule Take 800 Units by mouth daily with lunch.    . diphenhydrAMINE (SOMINEX) 25 MG tablet Take by mouth. (Patient not taking: No sig reported)     Current Facility-Administered Medications on File Prior to Visit  Medication Dose Route Frequency Provider Last Rate Last Admin  . 0.9 %  sodium chloride infusion   Intravenous Continuous Lequita Asal, MD 10 mL/hr at 12/11/19 1123 New Bag at 12/11/19 1123     ROS see history of present illness  Objective  Physical Exam Vitals:   08/22/20 1138  BP: 114/64  Pulse: 83  Temp: 97.6 F (36.4 C)  SpO2: 95%    BP Readings from Last 3 Encounters:  08/22/20 114/64  08/18/20 130/79  08/13/20 111/71   Wt Readings from Last 3 Encounters:  08/22/20 155 lb 3.2 oz (70.4 kg)  08/18/20 155 lb 12.1 oz (70.7 kg)  08/12/20 166 lb (75.3 kg)    Physical Exam Constitutional:      General: He is not in acute distress.    Appearance: He is not diaphoretic.  Cardiovascular:     Rate and Rhythm: Normal rate and regular rhythm.     Heart sounds: Normal heart sounds.  Pulmonary:     Effort: Pulmonary effort is normal.     Breath sounds: Normal breath sounds.  Abdominal:     General: Bowel sounds are normal.  Skin:    General: Skin is warm and dry.  Neurological:     Mental Status: He is alert.   Midline abdominal wound appears to be well-healing, there are 2 areas of granulation tissue around his umbilicus with no signs of infection   Assessment/Plan: Please see individual problem  list.  Problem List Items Addressed This Visit    Anemia    Likely worsened from his recent surgery and illness.  Hemoglobin had improved following blood transfusion and symptoms of shortness of breath have improved with blood transfusion.  He will monitor and continue to follow with his hematologist.      DDD (degenerative disc disease), lumbar    The patient has chronic pain related to degenerative changes in his back.  I advised that I was again to leave his chronic pain management to his pain management specialist as I did  not want to muddy the waters on management of any chronic narcotic pain medications.  He will follow up with his pain specialist as planned.      Hyperlipidemia    Lipid panel ordered.  He will continue Lipitor.      Relevant Orders   Lipid panel   Ischemic enteritis [facility])    Patient with acute ischemic enteritis based off of pathology report from his surgery for bowel obstruction.  Discussed the typical method of this occurring.  He has been recovering quite well with the exception of diarrhea which may be related to his postop course.  Infection has been ruled out for the diarrhea.  I encouraged him to use Imodium over-the-counter to help with the diarrhea.  He will see GI next week for this.  I did encourage him to contact the general surgery office next week if he continues to have serosanguineous drainage from the granulation tissue sites.  Advised to seek medical attention immediately if the area starts to look infected.      Protein-calorie malnutrition ([place])    Protein levels noted to be low on recent labs.  Patient has lost weight following his illness and surgery.  I encouraged addition of Ensure.  Discussed increasing protein intake.  I suspect his weight will rebound the further out he gets from his acute illness and as the diarrhea improves.       Other Visit Diagnoses    Diarrhea, unspecified type    -  Primary   Relevant Orders   Basic Metabolic  Panel (BMET)      This visit occurred during the SARS-CoV-2 public health emergency.  Safety protocols were in place, including screening questions prior to the visit, additional usage of staff PPE, and extensive cleaning of exam room while observing appropriate contact time as indicated for disinfecting solutions.   I have spent 41 minutes in the care of this patient regarding chart review, documentation, history taking, completion of exam, discussion of plan, placing orders.   [name], MD [place]

## 2020-08-22 NOTE — Assessment & Plan Note (Signed)
Patient with acute ischemic enteritis based off of pathology report from his surgery for bowel obstruction.  Discussed the typical method of this occurring.  He has been recovering quite well with the exception of diarrhea which may be related to his postop course.  Infection has been ruled out for the diarrhea.  I encouraged him to use Imodium over-the-counter to help with the diarrhea.  He will see GI next week for this.  I did encourage him to contact the general surgery office next week if he continues to have serosanguineous drainage from the granulation tissue sites.  Advised to seek medical attention immediately if the area starts to look infected.

## 2020-08-22 NOTE — Assessment & Plan Note (Signed)
Likely worsened from his recent surgery and illness.  Hemoglobin had improved following blood transfusion and symptoms of shortness of breath have improved with blood transfusion.  He will monitor and continue to follow with his hematologist.

## 2020-08-22 NOTE — Assessment & Plan Note (Signed)
Protein levels noted to be low on recent labs.  Patient has lost weight following his illness and surgery.  I encouraged addition of Ensure.  Discussed increasing protein intake.  I suspect his weight will rebound the further out he gets from his acute illness and as the diarrhea improves.

## 2020-08-22 NOTE — Patient Instructions (Signed)
Nice to see you. We will check your kidney function today. Please continue to try to stay hydrated. You can use Imodium over-the-counter. If you develop signs of infection around your bellybutton please seek medical attention. If the area is still draining next week please contact the general surgeon.

## 2020-08-22 NOTE — Assessment & Plan Note (Signed)
Lipid panel ordered.  He will continue Lipitor.

## 2020-08-22 NOTE — Assessment & Plan Note (Signed)
The patient has chronic pain related to degenerative changes in his back.  I advised that I was again to leave his chronic pain management to his pain management specialist as I did not want to muddy the waters on management of any chronic narcotic pain medications.  He will follow up with his pain specialist as planned.

## 2020-08-26 ENCOUNTER — Other Ambulatory Visit: Payer: Self-pay

## 2020-08-26 ENCOUNTER — Ambulatory Visit (INDEPENDENT_AMBULATORY_CARE_PROVIDER_SITE_OTHER): Payer: Medicare Other | Admitting: Gastroenterology

## 2020-08-26 ENCOUNTER — Encounter: Payer: Self-pay | Admitting: Gastroenterology

## 2020-08-26 ENCOUNTER — Telehealth: Payer: Self-pay

## 2020-08-26 VITALS — BP 118/65 | HR 67 | Temp 98.1°F | Wt 156.2 lb

## 2020-08-26 DIAGNOSIS — R197 Diarrhea, unspecified: Secondary | ICD-10-CM

## 2020-08-26 DIAGNOSIS — I110 Hypertensive heart disease with heart failure: Secondary | ICD-10-CM

## 2020-08-26 DIAGNOSIS — I4891 Unspecified atrial fibrillation: Secondary | ICD-10-CM

## 2020-08-26 DIAGNOSIS — K909 Intestinal malabsorption, unspecified: Secondary | ICD-10-CM | POA: Diagnosis not present

## 2020-08-26 DIAGNOSIS — M545 Low back pain, unspecified: Secondary | ICD-10-CM

## 2020-08-26 DIAGNOSIS — K912 Postsurgical malabsorption, not elsewhere classified: Secondary | ICD-10-CM

## 2020-08-26 DIAGNOSIS — I272 Pulmonary hypertension, unspecified: Secondary | ICD-10-CM

## 2020-08-26 DIAGNOSIS — K469 Unspecified abdominal hernia without obstruction or gangrene: Secondary | ICD-10-CM | POA: Diagnosis not present

## 2020-08-26 DIAGNOSIS — M810 Age-related osteoporosis without current pathological fracture: Secondary | ICD-10-CM

## 2020-08-26 DIAGNOSIS — I350 Nonrheumatic aortic (valve) stenosis: Secondary | ICD-10-CM

## 2020-08-26 DIAGNOSIS — K56699 Other intestinal obstruction unspecified as to partial versus complete obstruction: Secondary | ICD-10-CM

## 2020-08-26 DIAGNOSIS — I714 Abdominal aortic aneurysm, without rupture: Secondary | ICD-10-CM

## 2020-08-26 DIAGNOSIS — Z7901 Long term (current) use of anticoagulants: Secondary | ICD-10-CM

## 2020-08-26 DIAGNOSIS — Z48815 Encounter for surgical aftercare following surgery on the digestive system: Secondary | ICD-10-CM

## 2020-08-26 DIAGNOSIS — Z4801 Encounter for change or removal of surgical wound dressing: Secondary | ICD-10-CM | POA: Diagnosis not present

## 2020-08-26 DIAGNOSIS — I2510 Atherosclerotic heart disease of native coronary artery without angina pectoris: Secondary | ICD-10-CM

## 2020-08-26 DIAGNOSIS — Z9049 Acquired absence of other specified parts of digestive tract: Secondary | ICD-10-CM

## 2020-08-26 DIAGNOSIS — I509 Heart failure, unspecified: Secondary | ICD-10-CM

## 2020-08-26 DIAGNOSIS — Z9181 History of falling: Secondary | ICD-10-CM

## 2020-08-26 MED ORDER — OMEPRAZOLE 40 MG PO CPDR: 40.0000 mg | DELAYED_RELEASE_CAPSULE | Freq: Every day | ORAL | 3 refills | Status: DC

## 2020-08-26 NOTE — Progress Notes (Signed)
Jonathon Bellows MD, MRCP(U.K) [address]  [place]  [place], [place] [address]  Main: [phone]  Fax: [phone]   Gastroenterology Consultation  Referring Provider:     [name], MD Primary Care Physician:  [name], MD Primary Gastroenterologist:  Dr. Jonathon Bellows  Reason for Consultation:     Diarrhea        HPI:   TAYJON HALLADAY is a 80 y.o. y/o male referred for consultation & management  by Dr. Caryl Bis, Angela Adam, MD.     I have last seen him back in 2018 for iron deficiency anemia. Colonoscopy 09/2016 showed a small polyp-adenoma . 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 He subsequently had an incomplete capsule study as the capsule did not exit the stomach for over 6 hours, did not follow up after  He was subsequently seen by Dr [name] for his RA - plan to consider to start him on a biological agent . 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 E antigen status negative, Core antibody total negative ,E antibody negative.  Hepatitis B DNA not detected.  Hepatitis C antibody is negative. No recent HIV test available. He has never known to have hepatitis B.  Commenced him on tenofovir prior to initiation of anti-TNF.  He was admitted to [facility] in February 2022 with small bowel obstruction underwent surgery with right hemicolectomy and 60 cm terminal ileum removal.  500 cc bloody ascites and the distal part of the small bowel was necrotic.  Cecum was also ischemic.  The ventriculoseptal cirrhosis.  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  At his last visit with Dr. [name] complained of diarrhea.  Stool for GI PCR and C. difficile on 08/20/2020 was negative.  Hemoglobin on 08/18/2020 was 10 g.  He is on Eliquis for A. fib has diastolic heart failure, pulmonary hypertension and aortic stenosis with status post aortic valve replacement.    Presently having significant diarrhea after his surgery.   Taking 8 mg of Imodium daily with no significant relief.  He is losing weight.  Diet consists of foods with artificial sugars.  Some diet soda as well.  Not much fiber in his diet. Past Medical History:  Diagnosis Date  . Abnormal CT scan    Asymmetric left rectal wall thickening   . Anemia   . Anxiety   . Arrhythmia   . Basal cell carcinoma 04/28/2020   L forehead - ED&C 06/17/2020  . CHF (congestive heart failure) ([place])   . Chronic lower back pain   . Complication of anesthesia    Memory loss 09/2015  . Coronary artery disease   . Depression   . GERD (gastroesophageal reflux disease)   . Glaucoma   . H/O thoracic aortic aneurysm repair   . Headache   . Heart murmur   . History of being hospitalized    memory lose kidney funtion down blood pressure up  . History of blood transfusion    "think he had one when he had heart valve OR" (10/14/2016); "none since" (10/19/2017)  . History of chicken pox   . Hyperlipidemia   . Hypertension   . Lumbar stenosis   . Murmur   . Neuropathy   . Osteoarthritis    worse in feet and ankles  . Paroxysmal A-fib ([place])   . Poor short term memory    takes Aricept  . Rheumatoid arthritis ([place])    "all  over" (10/14/2016)  . Schizophrenia ([place])   . Seasonal allergies   . Thoracic spine pain   . Valvular heart disease   . Wears glasses    reading    Past Surgical History:  Procedure Laterality Date  . AORTIC VALVE REPLACEMENT  2007   [facility].  [place], [place]; "pig valve"  . APPENDECTOMY  2010  . BACK SURGERY    . CARDIAC VALVE REPLACEMENT    . CATARACT EXTRACTION W/ INTRAOCULAR LENS  IMPLANT, BILATERAL Bilateral 2012  . COLONOSCOPY WITH PROPOFOL N/A 09/24/2016   Procedure: COLONOSCOPY WITH PROPOFOL;  Surgeon: Jonathon Bellows, MD;  Location: [facility] ENDOSCOPY;  Service: Endoscopy;  Laterality: N/A;  . ESOPHAGOGASTRODUODENOSCOPY (EGD) WITH PROPOFOL N/A 09/24/2016   Procedure: ESOPHAGOGASTRODUODENOSCOPY (EGD) WITH PROPOFOL;  Surgeon: Jonathon Bellows,  MD;  Location: [facility] ENDOSCOPY;  Service: Endoscopy;  Laterality: N/A;  . GIVENS CAPSULE STUDY N/A 09/24/2016   Procedure: GIVENS CAPSULE STUDY;  Surgeon: Jonathon Bellows, MD;  Location: [facility] ENDOSCOPY;  Service: Endoscopy;  Laterality: N/A;  . HAMMER TOE SURGERY Right 10/09/2015   Procedure: HAMMER TOE REPAIR WITH K-WIRE FIXATION RIGHT SECOND TOE;  Surgeon: Albertine Patricia, DPM;  Location: [place];  Service: Podiatry;  Laterality: Right;  WITH LOCAL  . HARDWARE REMOVAL N/A 11/02/2019   Procedure: Removal of hardware thoracic spine;  Surgeon: Kary Kos, MD;  Location: [place];  Service: Neurosurgery;  Laterality: N/A;  posterior  . INGUINAL HERNIA REPAIR Right 05/05/2018   Medium Bard PerFix plug.  Surgeon: Robert Bellow, MD;  Location: [facility] ORS;  Service: General;  Laterality: Right;  . IR VERTEBROPLASTY CERV/THOR BX INC UNI/BIL INC/INJECT/IMAGING  10/27/2018  . JOINT REPLACEMENT Left    left total knee  . LAMINECTOMY WITH POSTERIOR LATERAL ARTHRODESIS LEVEL 3 N/A 09/28/2017   Procedure: LUMBAR  POSTEROR  FUSION REVISION - LUMBAR ONE -TWO, LUMBAR TWO -THREE,THREE-FOUR ,BILATERAL ARTHRODESIS REMOVAL LUMBAR THREE HARDWARE;  Surgeon: Kary Kos, MD;  Location: [place];  Service: Neurosurgery;  Laterality: N/A;  . LAMINECTOMY WITH POSTERIOR LATERAL ARTHRODESIS LEVEL 3 N/A 10/20/2017   Procedure: Revision fusion and removal of hardware Lumbar one, Pedicle screw fixation thoracic ten-lumbar two, thoracic nine-ten laminotomy, Posterior Lumbar Arthrodesis thoracic ten-lumbar two ;  Surgeon: Kary Kos, MD;  Location: [place];  Service: Neurosurgery;  Laterality: N/A;  . LAPAROSCOPIC CHOLECYSTECTOMY  2010  . LUMBAR FUSION Right 06/08/2017   LUMBAR THREE-FOUR, LUMBAR FOUR-FIVE POSTEROLATERAL ARTHRODESIS WITH RIGHT LUMBAR FOUR-FIVE LAMINECTOMY/FORAMINOTOMY  . LUMBAR LAMINECTOMY/DECOMPRESSION MICRODISCECTOMY Right 03/08/2016   Procedure: Laminectomy and Foraminotomy - Lumbar Five-Sacral One Right;  Surgeon:  Kary Kos, MD;  Location: [facility];  Service: Neurosurgery;  Laterality: Right;  Right  . MULTIPLE TOOTH EXTRACTIONS     "3"  . POSTERIOR LUMBAR FUSION  10/14/2016   L5-S1  . REPLACEMENT TOTAL KNEE Left 2003  . THORACIC AORTIC ANEURYSM REPAIR  2010    Prior to Admission medications   Medication Sig Start Date End Date Taking? Authorizing Provider  amLODipine (NORVASC) 5 MG tablet Take 1 tablet (5 mg total) by mouth daily. 06/13/20   Minna Merritts, MD  ARIPiprazole (ABILIFY) 5 MG tablet Take by mouth.    [provider]  atorvastatin (LIPITOR) 40 MG tablet TAKE ONE TABLET BY MOUTH EVERY DAY 08/27/19   [name], MD  calcium carbonate (OS-CAL - DOSED IN MG OF ELEMENTAL CALCIUM) 1250 (500 Ca) MG tablet Take 1 tablet by mouth daily with lunch.    [provider]  cetirizine (  ZYRTEC) 10 MG tablet Take 10 mg by mouth daily.    [provider]  diphenhydrAMINE (SOMINEX) 25 MG tablet Take by mouth. Patient not taking: No sig reported    [provider]  ELIQUIS 5 MG TABS tablet TAKE 1 TABLET BY MOUTH TWICE DAILY 01/18/20   Minna Merritts, MD  fenofibrate 160 MG tablet TAKE ONE TABLET AT BEDTIME 06/02/20   Minna Merritts, MD  FLUoxetine (PROZAC) 20 MG capsule TAKE 1 CAPSULE BY MOUTH ONCE DAILY 08/19/20   [name], MD  furosemide (LASIX) 40 MG tablet Take 40 mg by mouth as needed.    [provider]  gabapentin (NEURONTIN) 300 MG capsule TAKE 1 CAPSULE BY MOUTH 3 TIMES DAILY. 03/14/20   [name], MD  hydrALAZINE (APRESOLINE) 25 MG tablet Take by mouth. 05/26/16   [provider]  HYDROcodone-acetaminophen (NORCO) 10-325 MG tablet Take 1 tablet by mouth 4 (four) times daily as needed for moderate pain.     [provider]  hydrocortisone cream 1 % Apply 1 application topically daily as needed for itching.    [provider]  hydroxychloroquine (PLAQUENIL) 200 MG tablet Take 200 mg by mouth 3 (three)  times a week. Tue, Thurs, and Sat    [provider]  lisinopril (ZESTRIL) 20 MG tablet Take 1.5 tablets (30 mg total) by mouth every evening. 04/30/20   [name], NP  Multiple Vitamins-Minerals (PRESERVISION AREDS 2) CAPS Take 1 capsule by mouth 2 (two) times daily.    [provider]  traZODone (DESYREL) 50 MG tablet TAKE 1 TABLET AT BEDTIME MAY TAKE 2 TABLETS AT BEDTIME IF NEEDED 06/06/20   [name], MD  vitamin B-12 (CYANOCOBALAMIN) 1000 MCG tablet Take 1,000 mcg by mouth daily with lunch.     [provider]  Vitamin D, Ergocalciferol, (DRISDOL) 1.25 MG (50000 UNIT) CAPS capsule Take 50,000 Units by mouth once a week. 04/28/20   [provider]  vitamin E 180 MG (400 UNITS) capsule Take 800 Units by mouth daily with lunch.    [provider]    Family History  Problem Relation Age of Onset  . Heart failure Mother   . Hypertension Mother   . Asthma Mother   . Osteoporosis Mother   . Heart attack Father 62       MI  . Hypertension Sister   . Prostate cancer Neg Hx   . Bladder Cancer Neg Hx   . Kidney cancer Neg Hx   . Colon cancer Neg Hx      Social History   Tobacco Use  . Smoking status: Former Smoker    Packs/day: 1.00    Years: 32.00    Pack years: 32.00    Types: Cigarettes    Quit date: 07/05/1981    Years since quitting: 39.1  . Smokeless tobacco: Never Used  Vaping Use  . Vaping Use: Never used  Substance Use Topics  . Alcohol use: Yes    Comment: "glass of wine/month; if that"  . Drug use: Never    Allergies as of 08/26/2020 - Review Complete 08/22/2020  Allergen Reaction Noted  . Penicillins Hives, Swelling, and Other (See Comments) 11/06/2010  . Demerol [meperidine] Hives and Nausea And Vomiting 11/27/2013  . Other  08/12/2020    Review of Systems:    All systems reviewed and negative except where noted in HPI.   Physical Exam:  There were no vitals taken for this  visit. No LMP for male  patient. Psych:  Alert and cooperative. Normal mood and affect. General:   Alert,  Well-developed, well-nourished, pleasant and cooperative in NAD Head:  Normocephalic and atraumatic. Eyes:  Sclera clear, no icterus.   Conjunctiva pink. Abdomen: Small bandage present in the right middle part of his abdomen.  Surrounding skin appears normal.  Minimal discharge present.  Normal bowel sounds.  No bruits.  Soft, non-tender and non-distended without masses, hepatosplenomegaly or hernias noted.  No guarding or rebound tenderness.    Extremities:  No clubbing or edema.  No cyanosis. Neurologic:  Alert and oriented x3;  grossly normal neurologically. Psych:  Alert and cooperative. Normal mood and affect.  Imaging Studies: No results found.  Assessment and Plan:   AWESOME JARED is a 80 y.o. y/o male has been referred for severe diarrhea status post resection of 60 cm of the terminal ileum after an episode of ischemic enteritis.  He also underwent a right hemicolectomy at that point of time.  The diarrhea is most likely secondary to surgery absence of the ileocecal valve makes transit of the small bowel contents to the large intestine shorter.  Medications such as Plaquenil can also cause diarrhea.     Plan 1.  Commence on Prilosec 40 mg twice daily which helps reduce gastric hypersecretion and reduce fluid losses.  This is particularly for 6 months after surgery.    2.  Simple sugars/carbohydrates should be avoided ,complex carbohydrates should be encouraged, increase soluble fiber supplementation.  Overall a diet high in complex carbohydrates and low in fat and suggest a 60% carbohydrate content and 20% fat content would help to increase weight gain.  3.  Avoid drinks such as tea coffee soda which can lead to increased fluid losses.  Avoid artificial sugars and sweeteners.   4.  Increase Imodium from 8 mg a day which he is presently taking to  4 mg 4 times daily 30 minutes before each meal,  maximum dose of 16 mg a day  5.  At next visit if he is no better will consider adding tincture of opium, could also consider treatment with a course of Xifaxan.  Would also consider holding Plaquenil  6.  Refer to dietitian for short gut syndrome diet  7.  Check CBC, CMP, vitamin D E,K, Zinc ,  magnesium  8.  High-fiber diet patient information provided   Follow up in 7 to 10 days video visit Dr Jonathon Bellows MD,MRCP(U.K)

## 2020-08-26 NOTE — Telephone Encounter (Signed)
Please call patient. Ferritin appears falsely elevated with high sed rate and elevated transferrin soluble receptor. Consider Venofer weekly x 1-2.  Per secure chat from Dr [name] (see above). Left message for patient to call back.

## 2020-08-26 NOTE — Patient Instructions (Signed)
Plan 1.  Commence on Prilosec 40 mg twice daily which helps reduce gastric hypersecretion and reduce fluid losses.  This is particularly for 6 months after surgery.    2.  Simple sugars/carbohydrates should be avoided ,complex carbohydrates should be encouraged, increase soluble fiber supplementation.  Overall a diet high in complex carbohydrates and low in fat and suggest a 60% carbohydrate content and 20% fat content would help to increase weight gain.  3.  Avoid drinks such as tea coffee soda which can lead to increased fluid losses.  Avoid artificial sugars and sweeteners.   4.  Increase Imodium from 8 mg a day which he is presently taking to  4 mg 4 times daily 30 minutes before each meal, maximum dose of 16 mg a day  5.  At next visit if he is no better will consider adding tincture of opium, could also consider treatment with a course of Xifaxan.  Would also consider holding Plaquenil  6.  Refer to dietitian for short gut syndrome diet  7.  Check CBC, CMP, vitamin D E,K, Zinc ,  magnesium   Follow-up in 7 to 10 days video visit

## 2020-08-27 ENCOUNTER — Telehealth: Payer: Self-pay | Admitting: Hematology and Oncology

## 2020-08-27 ENCOUNTER — Other Ambulatory Visit: Payer: Self-pay | Admitting: Hematology and Oncology

## 2020-08-27 NOTE — Telephone Encounter (Signed)
Left VM with patient to notify him of appointments scheduled for venofer infusions on 4/11 and 4/19. Left pt our contact info to return call if there are any questions or conflicts.

## 2020-08-28 ENCOUNTER — Encounter: Payer: Self-pay | Admitting: Hematology and Oncology

## 2020-08-29 ENCOUNTER — Telehealth: Payer: Self-pay | Admitting: Family Medicine

## 2020-08-29 ENCOUNTER — Other Ambulatory Visit: Payer: Medicare Other

## 2020-08-29 DIAGNOSIS — J029 Acute pharyngitis, unspecified: Secondary | ICD-10-CM

## 2020-08-29 NOTE — Telephone Encounter (Signed)
Pt called he has developed a sore throat, fever and is achy  Pt wanted to just come have a Covid test since he was just seen on 08/25/20 or have home health come do one. Pt didn't want to go to an UC patient stated this started this morning he only has SOB when he moves around.  Shuree Brossart,cma

## 2020-08-29 NOTE — Telephone Encounter (Signed)
Pt called he has developed a sore throat, fever and is achy  Pt wanted to just come have a Covid test since he was just seen on 08/25/20 or have home health come do one. Pt didn't want to go to an UC

## 2020-08-29 NOTE — Telephone Encounter (Signed)
Order for Covid test placed.  The CMA verbally told the patient to stay quarantined at home until we contact him with his test results.  She also advised him to seek medical attention of his breathing worsened at all or if you develop chest pain.

## 2020-09-01 ENCOUNTER — Other Ambulatory Visit: Payer: Self-pay

## 2020-09-01 ENCOUNTER — Inpatient Hospital Stay: Payer: Medicare Other | Attending: Hematology and Oncology

## 2020-09-01 VITALS — BP 112/69 | HR 79 | Temp 96.8°F | Resp 20

## 2020-09-01 DIAGNOSIS — D509 Iron deficiency anemia, unspecified: Secondary | ICD-10-CM | POA: Diagnosis not present

## 2020-09-01 MED ORDER — SODIUM CHLORIDE 0.9 % IV SOLN
Freq: Once | INTRAVENOUS | Status: AC
  Filled 2020-09-01: qty 250

## 2020-09-01 MED ORDER — IRON SUCROSE 20 MG/ML IV SOLN
200.0000 mg | Freq: Once | INTRAVENOUS | Status: AC
  Administered 2020-09-01: 200 mg via INTRAVENOUS
  Filled 2020-09-01: qty 10

## 2020-09-01 NOTE — Telephone Encounter (Signed)
Patient did not show for his covid test and it was cancelled.  Jessice Madill,cma

## 2020-09-01 NOTE — Progress Notes (Signed)
Patient tolerated Venofer infusion well, no concerns voiced. Initial BP 108/52 now 112/69, BP improved post transfusion. Patient stated he took BP meds this am, asymptomatic. Patient discharged with wife. Stable.

## 2020-09-08 ENCOUNTER — Ambulatory Visit: Payer: Medicare Other | Admitting: Gastroenterology

## 2020-09-08 LAB — IRON,TIBC AND FERRITIN PANEL
Ferritin: 486
Iron: 39
TIBC: 253

## 2020-09-08 LAB — CBC: RBC: 2.65 — AB (ref 3.87–5.11)

## 2020-09-08 LAB — CBC AND DIFFERENTIAL
HCT: 25 — AB (ref 41–53)
Hemoglobin: 8 — AB (ref 13.5–17.5)
Platelets: 130 — AB (ref 150–399)
WBC: 3.5

## 2020-09-09 ENCOUNTER — Other Ambulatory Visit: Payer: Self-pay

## 2020-09-09 ENCOUNTER — Other Ambulatory Visit: Payer: Medicare Other

## 2020-09-09 ENCOUNTER — Inpatient Hospital Stay: Payer: Medicare Other

## 2020-09-09 VITALS — BP 115/57 | HR 71 | Temp 98.5°F | Resp 20

## 2020-09-09 DIAGNOSIS — D509 Iron deficiency anemia, unspecified: Secondary | ICD-10-CM | POA: Diagnosis not present

## 2020-09-09 MED ORDER — IRON SUCROSE 20 MG/ML IV SOLN
200.0000 mg | Freq: Once | INTRAVENOUS | Status: AC
Start: 2020-09-09 — End: 2020-09-09
  Administered 2020-09-09: 200 mg via INTRAVENOUS
  Filled 2020-09-09: qty 10

## 2020-09-09 MED ORDER — SODIUM CHLORIDE 0.9 % IV SOLN
Freq: Once | INTRAVENOUS | Status: AC
  Filled 2020-09-09: qty 250

## 2020-09-11 ENCOUNTER — Other Ambulatory Visit: Payer: Self-pay

## 2020-09-11 ENCOUNTER — Encounter: Payer: Self-pay | Admitting: Gastroenterology

## 2020-09-11 ENCOUNTER — Ambulatory Visit (INDEPENDENT_AMBULATORY_CARE_PROVIDER_SITE_OTHER): Payer: Medicare Other | Admitting: Dermatology

## 2020-09-11 ENCOUNTER — Telehealth (INDEPENDENT_AMBULATORY_CARE_PROVIDER_SITE_OTHER): Payer: Medicare Other | Admitting: Gastroenterology

## 2020-09-11 VITALS — BP 135/69 | HR 78 | Ht 71.0 in | Wt 160.4 lb

## 2020-09-11 DIAGNOSIS — K90829 Short bowel syndrome, unspecified: Secondary | ICD-10-CM

## 2020-09-11 DIAGNOSIS — L570 Actinic keratosis: Secondary | ICD-10-CM | POA: Diagnosis not present

## 2020-09-11 DIAGNOSIS — Z85828 Personal history of other malignant neoplasm of skin: Secondary | ICD-10-CM | POA: Diagnosis not present

## 2020-09-11 DIAGNOSIS — L578 Other skin changes due to chronic exposure to nonionizing radiation: Secondary | ICD-10-CM

## 2020-09-11 DIAGNOSIS — K912 Postsurgical malabsorption, not elsewhere classified: Secondary | ICD-10-CM | POA: Diagnosis not present

## 2020-09-11 DIAGNOSIS — K909 Intestinal malabsorption, unspecified: Secondary | ICD-10-CM

## 2020-09-11 DIAGNOSIS — R197 Diarrhea, unspecified: Secondary | ICD-10-CM | POA: Diagnosis not present

## 2020-09-11 DIAGNOSIS — L820 Inflamed seborrheic keratosis: Secondary | ICD-10-CM | POA: Diagnosis not present

## 2020-09-11 NOTE — Patient Instructions (Addendum)
  Levulan/PDT Treatment Common Side Effects Face treatment  - Burning/stinging, which may be severe and last up to 24-72 hours after your treatment  - Redness, swelling and/or peeling which may last up to 4 weeks  - Scaling/crusting which may last up to 2 weeks  - Sun sensitivity (you MUST avoid sun exposure for 48-72 hours after treatment)  Care Instructions  - Okay to wash with soap and water and shampoo as normal  - If needed, you can do a cold compress (ex. Ice packs) for comfort  - If okay with your Primary Doctor, you may use analgesics such as Tylenol every 4-6 hours, not to exceed recommended dose  - You may apply Cerave Healing Ointment, Vaseline or Aquaphor  - If you have a lot of swelling you may take a Benadryl to help with this (this may cause drowsiness)  Sun Precautions  - Wear a wide brim hat for the next week if outside  - Wear a sunblock with zinc or titanium dioxide at least SPF 50 daily   We will recheck you in 10-12 weeks. If any problems, please call the office and ask to speak with a nurse.

## 2020-09-11 NOTE — Progress Notes (Signed)
Follow-Up Visit   Subjective  Bryan [name] is a 80 y.o. male who presents for the following: Follow-up (3 months f/u BCC Left forehead removed 04/24/2020 treated with [facility] 06/17/2020). Check irritated growths on the hands and face.   The following portions of the chart were reviewed this encounter and updated as appropriate:   Tobacco  Allergies  Meds  Problems  Med Hx  Surg Hx  Fam Hx     Review of Systems:  No other skin or systemic complaints except as noted in HPI or Assessment and Plan.  Objective  Well appearing patient in no apparent distress; mood and affect are within normal limits.  A focused examination was performed including face,arms, hands . Relevant physical exam findings are noted in the Assessment and Plan.  Objective  face (16): Erythematous thin papules/macules with gritty scale.   Objective  left forehead: Well healed scar with no evidence of recurrence.   Objective  hands (2): Erythematous keratotic or waxy stuck-on papule or plaque.    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 Observe for recurrence. Call clinic for new or changing lesions.  Recommend regular skin exams, daily broad-spectrum spf 30+ sunscreen use, and photoprotection.     Inflamed seborrheic keratosis (2) hands  Destruction of lesion - hands Complexity: simple   Destruction method: cryotherapy   Informed consent: discussed and consent obtained   Timeout:   patient name, date of birth, surgical site, and procedure verified Lesion destroyed using liquid nitrogen: Yes   Region frozen until ice ball extended beyond lesion: Yes   Outcome: patient tolerated procedure well with no complications   Post-procedure details: wound care instructions given     Actinic Damage - Severe, confluent actinic changes with pre-cancerous actinic keratoses  - Severe, chronic, not at goal, secondary to cumulative UV radiation exposure over time - diffuse scaly erythematous macules and papules with underlying dyspigmentation - Discussed Prescription "Field Treatment" for Severe, Chronic Confluent Actinic Changes with Pre-Cancerous Actinic Keratoses Return in 4 weeks for PDT on face  Field treatment involves treatment of an entire area of skin that has confluent Actinic Changes (Sun/ Ultraviolet light damage) and PreCancerous Actinic Keratoses by method of PhotoDynamic Therapy (PDT) and/or prescription Topical Chemotherapy agents such as 5-fluorouracil, 5-fluorouracil/calcipotriene, and/or imiquimod.  The purpose is to decrease the number of clinically evident and subclinical PreCancerous lesions to prevent progression to development of skin cancer by chemically destroying early precancer changes that may or may not be visible.  It has been shown to reduce the risk of developing skin cancer in the treated area. As a result of treatment, redness, scaling, crusting, and open sores may occur during treatment course. One or more than one of these methods may be used and may have to be used several times to control, suppress and eliminate the PreCancerous changes. Discussed treatment course, expected reaction, and possible side effects. - Recommend daily broad spectrum sunscreen SPF 30+ to sun-exposed areas, reapply every 2 hours as needed.  -  Staying in the shade or wearing long sleeves, sun glasses (UVA+UVB protection) and wide brim hats (4-inch brim around the entire circumference of  the hat) are also recommended. - Call for new or changing lesions. - Will schedule photodynamic therapy to the scalp   Return in about 4 weeks (around 10/09/2020) for PDT on face, 4 months recheck Aks .  IMarye Round, CMA, am acting as scribe for Sarina Ser, MD .  Documentation: I have reviewed the above documentation for accuracy and completeness, and I agree with the above.  Sarina Ser, MD

## 2020-09-11 NOTE — Progress Notes (Signed)
Jonathon Bellows MD, MRCP(U.K) [address]  [place]  [place], [place] [address]  Main: [phone]  Fax: [phone]   Primary Care Physician: [name], MD  Primary Gastroenterologist:  Dr. Jonathon Bellows   Chief Complaint  Patient presents with  . Office visit    10 day f/u    HPI: Bryan [name] is a 80 y.o. male     Diarrhea follow up    Summary of history :  Last seen on 08/26/2020   Seen back in 2018 for iron deficiency anemia.Colonoscopy 09/2016 showed a small polyp-adenoma . 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 He subsequently had an incomplete capsule study as the capsule did not exit the stomach for over 6 hours, did not follow up after  He was subsequently seen by Dr [name] for his RA - plan to consider to start him on a biological agent . 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 E antigen status negative, Core antibody total negative ,E antibody negative.  Hepatitis B DNA not detected.  Hepatitis C antibody is negative. No recent HIV test available. He has never known to have hepatitis B.  Commenced him on tenofovir prior to initiation of anti-TNF.  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  500 cc bloody ascites and the distal part of the small bowel was necrotic.  Cecum was also ischemic.  The ventriculoseptal cirrhosis.  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  At his last visit with Dr. [name] complained of diarrhea.  Stool for GI PCR and C. difficile on 08/20/2020 was negative.  Hemoglobin on 08/18/2020 was 10 g.  He is on Eliquis for A. fib has diastolic heart failure, pulmonary hypertension and aortic stenosis with status post aortic valve replacement.    Diarrhea bgean  after his surgery.  Was taking 8 mg of Imodium daily with no significant relief.  He was losing weight.   Diet consists of foods with artificial      Interval history 08/26/2020-09/11/2020  09/08/2020: iron studies,CMP,zinc, vitamin C,A, normal.  Since increasing the dose of Imodium, and staying on a PPI, stopping all artificial sugars and changing his diet he has had complete resolution of diarrhea in fact has not had a bowel movement in a few days.  Current Outpatient Medications  Medication Sig Dispense Refill  . amLODipine (NORVASC) 5 MG tablet Take 1 tablet (5 mg total) by mouth daily. 90 tablet 3  . ARIPiprazole (ABILIFY) 5 MG tablet Take by mouth.    Marland Kitchen atorvastatin (LIPITOR) 40 MG tablet TAKE ONE TABLET BY MOUTH EVERY DAY 90 tablet 3  . calcium carbonate (OS-CAL - DOSED IN MG OF ELEMENTAL CALCIUM) 1250 (500 Ca) MG tablet Take 1 tablet by mouth daily with lunch.    . cetirizine (ZYRTEC) 10 MG tablet Take 10 mg by mouth daily.    . diphenhydrAMINE (SOMINEX) 25 MG tablet Take by mouth.    [name] 5 MG TABS tablet TAKE 1 TABLET BY MOUTH TWICE DAILY 180 tablet 2  . fenofibrate 160 MG tablet TAKE ONE TABLET AT BEDTIME 90 tablet 3  . FLUoxetine (PROZAC) 20 MG capsule TAKE 1 CAPSULE BY MOUTH ONCE DAILY 90 capsule 1  . gabapentin (NEURONTIN) 300 MG capsule TAKE 1 CAPSULE BY MOUTH 3 TIMES DAILY. 270 capsule 1  . hydrocortisone cream 1 % Apply 1  application topically daily as needed for itching.    . hydroxychloroquine (PLAQUENIL) 200 MG tablet Take 200 mg by mouth 3 (three) times a week. Tue, Thurs, and Sat    . lisinopril (ZESTRIL) 20 MG tablet Take 1.5 tablets (30 mg total) by mouth every evening. 135 tablet 1  . Multiple Vitamins-Minerals (PRESERVISION AREDS 2) CAPS Take 1 capsule by mouth 2 (two) times daily.    Marland Kitchen omeprazole (PRILOSEC) 40 MG capsule Take 1 capsule (40 mg total) by mouth daily. 90 capsule 3  . traZODone (DESYREL) 50 MG tablet TAKE 1 TABLET AT BEDTIME MAY TAKE 2 TABLETS AT BEDTIME IF NEEDED 90 tablet 1  . vitamin B-12 (CYANOCOBALAMIN) 1000 MCG tablet Take 1,000 mcg by mouth  daily with lunch.     . Vitamin D, Ergocalciferol, (DRISDOL) 1.25 MG (50000 UNIT) CAPS capsule Take 50,000 Units by mouth once a week.    . vitamin E 180 MG (400 UNITS) capsule Take 800 Units by mouth daily with lunch.    . furosemide (LASIX) 40 MG tablet Take 40 mg by mouth as needed. (Patient not taking: Reported on 09/11/2020)    . hydrALAZINE (APRESOLINE) 25 MG tablet Take by mouth. (Patient not taking: No sig reported)    . HYDROcodone-acetaminophen (NORCO) 10-325 MG tablet Take 1 tablet by mouth 4 (four) times daily as needed for moderate pain.  (Patient not taking: No sig reported)     No current facility-administered medications for this visit.   Facility-Administered Medications Ordered in Other Visits  Medication Dose Route Frequency Provider Last Rate Last Admin  . 0.9 %  sodium chloride infusion   Intravenous Continuous Lequita Asal, MD 10 mL/hr at 12/11/19 1123 New Bag at 12/11/19 1123    Allergies as of 09/11/2020 - Review Complete 09/11/2020  Allergen Reaction Noted  . Penicillins Hives, Swelling, and Other (See Comments) 11/06/2010  . Demerol [meperidine] Hives and Nausea And Vomiting 11/27/2013  . Other  08/12/2020    ROS:  General: Negative for anorexia, weight loss, fever, chills, fatigue, weakness. ENT: Negative for hoarseness, difficulty swallowing , nasal congestion. CV: Negative for chest pain, angina, palpitations, dyspnea on exertion, peripheral edema.  Respiratory: Negative for dyspnea at rest, dyspnea on exertion, cough, sputum, wheezing.  GI: See history of present illness. GU:  Negative for dysuria, hematuria, urinary incontinence, urinary frequency, nocturnal urination.  Endo: Negative for unusual weight change.    Physical Examination:   BP 135/69 (BP Location: Right Arm, Patient Position: Sitting, Cuff Size: Normal)   Pulse 78   Ht 5\' 11"  (1.803 m)   Wt 160 lb 6.4 oz (72.8 kg)   BMI 22.37 kg/m   General: Well-nourished, well-developed in  no acute distress.  Eyes: No icterus. Conjunctivae pink. Neuro: Alert and oriented x 3.  Grossly intact. Skin: Warm and dry, no jaundice.   Psych: Alert and cooperative, normal mood and affect.   Imaging Studies: No results found.  Assessment and Plan:   Bryan [name] is a 80 y.o. y/o male  here to follow up  for severe diarrhea status post resection of 60 cm of the terminal ileum after an episode of ischemic enteritis.  He also underwent a right hemicolectomy at that point of time.  The diarrhea is most likely secondary to surgery, absence of the ileocecal valve makes transit of the small bowel contents to the large intestine shorter.  Medications such as Plaquenil can also cause diarrhea.  Since I increased his Imodium dose, commenced him  on high-dose PPI, changed his diet to include complex carbohydrates rather than simple sugars and diet low in fat, stopped beverages such as tea coffee and soda, he has had complete resolution of the diarrhea.  He is presently taking Imodium 4 times a day.  I have advised him to gradually decrease it to 3 times a day then after few weeks to 2 times a day and then gradually to once and then completely off of it.  Down the road if he has further recurrence of diarrhea can consider treatment with Xifaxan or holding Plaquenil if needed as a last option.  Continue high-fiber diet      Dr Jonathon Bellows  MD,MRCP [facility]) Follow up in 4 months with plan to ensure he is of the Imodium, and decrease dose of PPI

## 2020-09-14 ENCOUNTER — Encounter: Payer: Self-pay | Admitting: Dermatology

## 2020-09-16 ENCOUNTER — Encounter: Payer: Self-pay | Admitting: Nurse Practitioner

## 2020-09-16 ENCOUNTER — Other Ambulatory Visit: Payer: Self-pay

## 2020-09-16 ENCOUNTER — Telehealth: Payer: Self-pay

## 2020-09-16 DIAGNOSIS — D509 Iron deficiency anemia, unspecified: Secondary | ICD-10-CM

## 2020-09-16 NOTE — Telephone Encounter (Signed)
Lab orders have been faxed to [facility] at [phone] for the 09/29/2020 appointment.

## 2020-09-17 LAB — CBC WITH DIFFERENTIAL/PLATELET
Basophils Absolute: 0.1 10*3/uL (ref 0.0–0.2)
Basos: 1 %
EOS (ABSOLUTE): 0.1 10*3/uL (ref 0.0–0.4)
Eos: 2 %
Hematocrit: 29.3 % — ABNORMAL LOW (ref 37.5–51.0)
Hemoglobin: 9.6 g/dL — ABNORMAL LOW (ref 13.0–17.7)
Immature Grans (Abs): 0 10*3/uL (ref 0.0–0.1)
Immature Granulocytes: 0 %
Lymphocytes Absolute: 0.8 10*3/uL (ref 0.7–3.1)
Lymphs: 18 %
MCH: 30.9 pg (ref 26.6–33.0)
MCHC: 32.8 g/dL (ref 31.5–35.7)
MCV: 94 fL (ref 79–97)
Monocytes Absolute: 0.4 10*3/uL (ref 0.1–0.9)
Monocytes: 10 %
NRBC: 1 % — ABNORMAL HIGH (ref 0–0)
Neutrophils Absolute: 3 10*3/uL (ref 1.4–7.0)
Neutrophils: 69 %
Platelets: 156 10*3/uL (ref 150–450)
RBC: 3.11 x10E6/uL — ABNORMAL LOW (ref 4.14–5.80)
RDW: 14.6 % (ref 11.6–15.4)
WBC: 4.4 10*3/uL (ref 3.4–10.8)

## 2020-09-17 LAB — COMPREHENSIVE METABOLIC PANEL
ALT: 12 IU/L (ref 0–44)
AST: 14 IU/L (ref 0–40)
Albumin/Globulin Ratio: 1.4 (ref 1.2–2.2)
Albumin: 3.9 g/dL (ref 3.6–4.6)
Alkaline Phosphatase: 92 IU/L (ref 44–121)
BUN/Creatinine Ratio: 22 (ref 10–24)
BUN: 20 mg/dL (ref 8–27)
Bilirubin Total: 0.2 mg/dL (ref 0.0–1.2)
CO2: 21 mmol/L (ref 20–29)
Calcium: 9.2 mg/dL (ref 8.6–10.2)
Chloride: 102 mmol/L (ref 96–106)
Creatinine, Ser: 0.92 mg/dL (ref 0.76–1.27)
Globulin, Total: 2.7 g/dL (ref 1.5–4.5)
Glucose: 99 mg/dL (ref 65–99)
Potassium: 5 mmol/L (ref 3.5–5.2)
Sodium: 137 mmol/L (ref 134–144)
Total Protein: 6.6 g/dL (ref 6.0–8.5)
eGFR: 83 mL/min/{1.73_m2} (ref >59)

## 2020-09-17 LAB — VITAMIN K1, SERUM: VITAMIN K1: 1.25 ng/mL (ref 0.10–2.20)

## 2020-09-17 LAB — ZINC: Zinc: 66 ug/dL (ref 44–115)

## 2020-09-17 LAB — MAGNESIUM: Magnesium: 1.6 mg/dL (ref 1.6–2.3)

## 2020-09-17 LAB — VITAMIN C: Vitamin C: 1 mg/dL (ref 0.4–2.0)

## 2020-09-17 LAB — VITAMIN A: Vitamin A: 46.6 ug/dL (ref 22.0–69.5)

## 2020-09-24 ENCOUNTER — Ambulatory Visit: Payer: Medicare Other | Admitting: Family Medicine

## 2020-09-29 ENCOUNTER — Other Ambulatory Visit: Payer: Medicare Other

## 2020-09-30 ENCOUNTER — Inpatient Hospital Stay: Payer: Medicare Other | Attending: Nurse Practitioner | Admitting: Nurse Practitioner

## 2020-09-30 ENCOUNTER — Other Ambulatory Visit: Payer: Self-pay

## 2020-09-30 ENCOUNTER — Inpatient Hospital Stay: Payer: Medicare Other

## 2020-09-30 VITALS — BP 118/63 | HR 72 | Temp 98.1°F | Resp 14 | Wt 157.6 lb

## 2020-09-30 DIAGNOSIS — D696 Thrombocytopenia, unspecified: Secondary | ICD-10-CM

## 2020-09-30 DIAGNOSIS — D72819 Decreased white blood cell count, unspecified: Secondary | ICD-10-CM | POA: Diagnosis not present

## 2020-09-30 DIAGNOSIS — D509 Iron deficiency anemia, unspecified: Secondary | ICD-10-CM | POA: Diagnosis not present

## 2020-09-30 DIAGNOSIS — Z79899 Other long term (current) drug therapy: Secondary | ICD-10-CM | POA: Diagnosis not present

## 2020-09-30 NOTE — Progress Notes (Signed)
[facility]  [address] [place], [place] [address] Phone: [phone]  Fax: [phone]   Clinic Day:  09/30/2020    Referring physician: [name], MD  Chief Complaint: Iron deficiency anemia and mild pancytopenia  Hematology History:   Bryan [name] is a 80 y.o. male with rheumatoid arthritis and progressive anemia over the past 2 years.  He has been on Plaquenil and hydralazine which can cause anemia.  Plaquenil was discontinued on 08/26/2016.  He denies any exposure to radiation or toxins.  He denies any prior history of hepatitis, prior transfusions or HIV risk factors.  He denies any herbal products.   Diet is good.  EGD on 09/24/2016 revealed patchy candidiasis in the entire esophagus.  There were two non-bleeding angioectasias in the duodenum treated with argon plasma coagulation.  Gastritis was biopsied.  Pathology revealed mild chronic gastritis negative for H pylori, dysplasia and malignancy.  Colonoscopy on 09/24/2016 revealed diverticulosis in the entire colon, one 3 mm polyp in the cecum, and non-bleeding internal hemorrhoids.  Pathology from the cecal polyp revealed a tubular adenoma negative for high grade dysplasia and malignancy.     He denies any melena or hematochezia. He denies any hematuria.   CBC on 06/02/2016 revealed a hematocrit of 29.5, hemoglobin 10.0, MCV 92.3, platelets 155,000, and WBC 4200.  Stool was guaiac negative x 2 in 06/2016.  Labs on 06/04/2016 revealed a ferritin of 124, iron saturation 22% and TIBC of 370.  B12 was 883 on 06/18/2014.   Work-up on 07/19/2016 revealed a hematocrit of 31.7, hemoglobin 10.6, MCV 91, platelets 143,000, white count 3000 with an ANC of 1800.  Absolute lymphocyte count was 700 (low). Creatinine was 1.57.  LDH was 269 (98-192).  Normal labs included:  uric acid, folate, [place], SPEP, and copper.  Iron studies included a saturation of 11% (low) and a TIBC of 454 (high) c/w  iron deficiency anemia.  Sed rate was 19.  Retic was 0.9% (low).  He is on oral iron with vitamin C.   Chest, abdomen, and pelvic CT on 08/09/2016 revealed no acute findings or clear evidence of malignancy in the chest, abdomen or pelvis.  There was asymmetric left rectal wall thickening possibly secondary to volume averaging with the prostate gland.   Bone marrow aspirate and biopsy on 11/04/2016 revealed a normocellular marrow (20%) with trilineage hematopoiesis and maturation.  There was mild megakaryocytic atypia.  Storage iron was present.  There were rare ringed sideroblasts.  Cytogenetics and FISH were normal (46, XY).   He was admitted to [facility] on 10/14/2016 - 10/18/2016 for spinal stenosis of the lumbar region.  He underwent redo decompressive lumbar laminotomy L5-S1 with radical foraminotomy the L5 and S1 nerve root with complete medial facetectomies. He underwent back surgery (revision fusion removal hardware and pedicle screw fixation) on 10/20/2017.   He was admitted to [facility] from 10/28/2017 - 10/31/2017 with symptomatic anemia.  He received 2 units of PRBCs.  Hemoglobin improved from 6.2 to 8.1.   Hospital work-up revealed the following normal labs: ferritin (65), B12 (401), TSH, SPEP, haptoglobin.  Testosterone was < 3 (low).  Iron saturation was 7% with a TIBC of 317.  LDH was 205 (98-192).  Retic was 2.6%.  Creatinine was 1.37 (CrCl 47.3 ml/min).   Peripheral smear revealed leukopenia with normal WBC morphology. Normocytic anemia with polychromasia, anisocytosis and rouleaux formation.    Work-up on 11/08/2017 revealed a hematocrit of 27.2, hemoglobin 9.0, platelets  200,000, WBC 5800 with an [place] of 4100.  Ferritin was 86.  Iron saturation was 6% with a TIBC of 382.  Sed rate was 56 (0-20).  Cold agglutinins were negative.  Reticulocyte count was 1.2%.  SPEP revealed no monoclonal protein on 10/31/2017.   He received weekly Venofer x 2 (11/25/2017 - 12/02/2017), x 2 (12/23/2017 -  12/30/2017), and x 3 (12/11/2019 - 12/25/2019), and x 2 (01/22/2020 - 01/29/2020).   Ferritin has been followed: 106 on 06/24/2017, 65 on 10/30/2017, 86 on 11/08/2017, 124 on 12/12/2017, 106 on 12/15/2017, 95 on 01/20/2018, 133 on 03/10/2018, 90 on 12/19/2018, 138 on 06/20/2019, 180 on 11/22/2019, 106 on 12/05/2019, 125 on 01/14/2020, 205 on 02/12/2020, 135 on 05/19/2020, and 165 on 08/12/2020.  Iron saturation was 6% on 06/24/2017, 6% on 01/20/2018, 11% on 12/05/2019, 14% on 01/14/2020, 19% on 05/19/2020, and 8% on 08/12/2020.  Sed rate was 56 on 11/08/2017, 64 on 12/05/2017, 40 on 11/22/2019, 17 on 01/14/2020, 10 on 05/19/2020 and 47 on 08/13/2020.   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  He has treated with Septra then switched to doxycycline.  He underwent back surgery on 10/20/2017.  The patient was admitted to [facility] from 11/12/2019-11/14/2019 for hemoptysis, pulmonary edema, and acute hypoxemic respiratory failure. CXR revealed symmetric bilateral airspace disease be secondary to pulmonary edema or pneumonia.   The patient received the [place] COVID-19 vaccines in 05/2019.   HPI:  Patient returns to clinic for further evaluation and follow up for iron deficiency anemia, thrombocytopenia, leukopenia. Diarrhea has resolved. Energy has improved. He feels better. Neuropathy persists but unchanged. Denies any neurologic complaints. Denies recent fevers or illnesses. Denies any easy bleeding or bruising. No melena or hematochezia. No pica or restless leg. Reports good appetite and denies weight loss. Denies chest pain. Denies any nausea, vomiting, constipation, or diarrhea. Denies urinary complaints. Patient offers no further specific complaints today.  Past Medical History:  Diagnosis Date   Abnormal CT scan    Asymmetric left rectal wall thickening    Anemia    Anxiety    Arrhythmia    Basal cell carcinoma 04/28/2020   L forehead - ED&C 06/17/2020   CHF (congestive heart failure)  (HCC)    Chronic lower back pain    Complication of anesthesia    Memory loss 09/2015   Coronary artery disease    Depression    GERD (gastroesophageal reflux disease)    Glaucoma    H/O thoracic aortic aneurysm repair    Headache    Heart murmur    History of being hospitalized    memory lose kidney funtion down blood pressure up   History of blood transfusion    "think he had one when he had heart valve OR" (10/14/2016); "none since" (10/19/2017)   History of chicken pox    Hyperlipidemia    Hypertension    Lumbar stenosis    Murmur    Neuropathy    Osteoarthritis    worse in feet and ankles   Paroxysmal A-fib ([place])    Poor short term memory    takes Aricept   Rheumatoid arthritis ([place])    "all over" (10/14/2016)   Schizophrenia ([place])    Seasonal allergies    Thoracic spine pain    Valvular heart disease    Wears glasses    reading    Past Surgical History:  Procedure Laterality Date   AORTIC VALVE REPLACEMENT  2007   [facility].  [place],  [place]; "pig valve"   APPENDECTOMY  2010   BACK SURGERY     CARDIAC VALVE REPLACEMENT     CATARACT EXTRACTION W/ INTRAOCULAR LENS  IMPLANT, BILATERAL Bilateral 2012   COLONOSCOPY WITH PROPOFOL N/A 09/24/2016   Procedure: COLONOSCOPY WITH PROPOFOL;  Surgeon: Jonathon Bellows, MD;  Location: [facility] ENDOSCOPY;  Service: Endoscopy;  Laterality: N/A;   ESOPHAGOGASTRODUODENOSCOPY (EGD) WITH PROPOFOL N/A 09/24/2016   Procedure: ESOPHAGOGASTRODUODENOSCOPY (EGD) WITH PROPOFOL;  Surgeon: Jonathon Bellows, MD;  Location: [facility] ENDOSCOPY;  Service: Endoscopy;  Laterality: N/A;   GIVENS CAPSULE STUDY N/A 09/24/2016   Procedure: GIVENS CAPSULE STUDY;  Surgeon: Jonathon Bellows, MD;  Location: [facility] ENDOSCOPY;  Service: Endoscopy;  Laterality: N/A;   HAMMER TOE SURGERY Right 10/09/2015   Procedure: HAMMER TOE REPAIR WITH K-WIRE FIXATION RIGHT SECOND TOE;  Surgeon: Albertine Patricia, DPM;  Location: [place];  Service: Podiatry;  Laterality: Right;  WITH LOCAL    HARDWARE REMOVAL N/A 11/02/2019   Procedure: Removal of hardware thoracic spine;  Surgeon: Kary Kos, MD;  Location: [place];  Service: Neurosurgery;  Laterality: N/A;  posterior   INGUINAL HERNIA REPAIR Right 05/05/2018   Medium Bard PerFix plug.  Surgeon: Robert Bellow, MD;  Location: [facility] ORS;  Service: General;  Laterality: Right;   IR VERTEBROPLASTY CERV/THOR BX INC UNI/BIL INC/INJECT/IMAGING  10/27/2018   JOINT REPLACEMENT Left    left total knee   LAMINECTOMY WITH POSTERIOR LATERAL ARTHRODESIS LEVEL 3 N/A 09/28/2017   Procedure: LUMBAR  POSTEROR  FUSION REVISION - LUMBAR ONE -TWO, LUMBAR TWO -THREE,THREE-FOUR ,BILATERAL ARTHRODESIS REMOVAL LUMBAR THREE HARDWARE;  Surgeon: Kary Kos, MD;  Location: [place];  Service: Neurosurgery;  Laterality: N/A;   LAMINECTOMY WITH POSTERIOR LATERAL ARTHRODESIS LEVEL 3 N/A 10/20/2017   Procedure: Revision fusion and removal of hardware Lumbar one, Pedicle screw fixation thoracic ten-lumbar two, thoracic nine-ten laminotomy, Posterior Lumbar Arthrodesis thoracic ten-lumbar two ;  Surgeon: Kary Kos, MD;  Location: [place];  Service: Neurosurgery;  Laterality: N/A;   LAPAROSCOPIC CHOLECYSTECTOMY  2010   LUMBAR FUSION Right 06/08/2017   LUMBAR THREE-FOUR, LUMBAR FOUR-FIVE POSTEROLATERAL ARTHRODESIS WITH RIGHT LUMBAR FOUR-FIVE LAMINECTOMY/FORAMINOTOMY   LUMBAR LAMINECTOMY/DECOMPRESSION MICRODISCECTOMY Right 03/08/2016   Procedure: Laminectomy and Foraminotomy - Lumbar Five-Sacral One Right;  Surgeon: Kary Kos, MD;  Location: [place];  Service: Neurosurgery;  Laterality: Right;  Right   MULTIPLE TOOTH EXTRACTIONS     "3"   POSTERIOR LUMBAR FUSION  10/14/2016   L5-S1   REPLACEMENT TOTAL KNEE Left 2003   THORACIC AORTIC ANEURYSM REPAIR  2010    Family History  Problem Relation Age of Onset   Heart failure Mother    Hypertension Mother    Asthma Mother    Osteoporosis Mother    Heart attack Father 74       MI   Hypertension Sister    Prostate cancer Neg  Hx    Bladder Cancer Neg Hx    Kidney cancer Neg Hx    Colon cancer Neg Hx     Social History:  reports that he quit smoking about 39 years ago. His smoking use included cigarettes. He has a 32.00 pack-year smoking history. He has never used smokeless tobacco. He reports current alcohol use. He reports that he does not use drugs. He smoked 1 pack a day x 12 years.  He stopped smoking 30 years ago.  He drinks a glass of wine occasionally.  He is a Software engineer.  [place] His wife passed away in 07/30/2018. His friend is Stanford [place]. The patient is alone today.   Allergies:  Allergies  Allergen Reactions   Penicillins Hives, Swelling and Other (See Comments)    SWELLING REACTION UNSPECIFIED PATIENT HAS TAKEN AMOXICILLIN ON MED HX FROM [facility] PATIENT HAS HAD A PCN REACTION WITH IMMEDIATE RASH, FACIAL/TONGUE/THROAT SWELLING, SOB, OR LIGHTHEADEDNESS WITH HYPOTENSION:  #  #  #  YES  #  #  #   Has patient had a PCN reaction causing severe rash involving mucus membranes or skin necrosis: No Has patient had a PCN reaction that required hospitalization No Has patient had a PCN reaction occurring within the last 10 years: No   Demerol [Meperidine] Hives and Nausea And Vomiting   Other     Current Medications: Current Outpatient Medications  Medication Sig Dispense Refill   amLODipine (NORVASC) 5 MG tablet Take 1 tablet (5 mg total) by mouth daily. 90 tablet 3   ARIPiprazole (ABILIFY) 5 MG tablet Take by mouth.     atorvastatin (LIPITOR) 40 MG tablet TAKE ONE TABLET BY MOUTH EVERY DAY 90 tablet 3   calcium carbonate (OS-CAL - DOSED IN MG OF ELEMENTAL CALCIUM) 1250 (500 Ca) MG tablet Take 1 tablet by mouth daily with lunch.     cetirizine (ZYRTEC) 10 MG tablet Take 10 mg by mouth daily.     diphenhydrAMINE (SOMINEX) 25 MG tablet Take by mouth.     donepezil (ARICEPT) 10 MG tablet Take by mouth.     ELIQUIS 5 MG TABS tablet TAKE 1 TABLET BY MOUTH TWICE  DAILY 180 tablet 2   fenofibrate 160 MG tablet TAKE ONE TABLET AT BEDTIME 90 tablet 3   FLUoxetine (PROZAC) 20 MG capsule TAKE 1 CAPSULE BY MOUTH ONCE DAILY 90 capsule 1   gabapentin (NEURONTIN) 300 MG capsule TAKE 1 CAPSULE BY MOUTH 3 TIMES DAILY. 270 capsule 1   hydrocortisone cream 1 % Apply 1 application topically daily as needed for itching.     hydroxychloroquine (PLAQUENIL) 200 MG tablet Take 200 mg by mouth 3 (three) times a week. Tue, Thurs, and Sat     lisinopril (ZESTRIL) 20 MG tablet Take 1.5 tablets (30 mg total) by mouth every evening. 135 tablet 1   loperamide (IMODIUM) 2 MG capsule Take by mouth.     Multiple Vitamins-Minerals (PRESERVISION AREDS 2) CAPS Take 1 capsule by mouth 2 (two) times daily.     omeprazole (PRILOSEC) 40 MG capsule Take 1 capsule (40 mg total) by mouth daily. 90 capsule 3   traZODone (DESYREL) 50 MG tablet TAKE 1 TABLET AT BEDTIME MAY TAKE 2 TABLETS AT BEDTIME IF NEEDED 90 tablet 1   vitamin B-12 (CYANOCOBALAMIN) 1000 MCG tablet Take 1,000 mcg by mouth daily with lunch.      Vitamin D, Ergocalciferol, (DRISDOL) 1.25 MG (50000 UNIT) CAPS capsule Take 50,000 Units by mouth once a week.     vitamin E 180 MG (400 UNITS) capsule Take 800 Units by mouth daily with lunch.     furosemide (LASIX) 40 MG tablet Take 40 mg by mouth as needed. (Patient not taking: No sig reported)     hydrALAZINE (APRESOLINE) 25 MG tablet Take by mouth. (Patient not taking: No sig reported)     HYDROcodone-acetaminophen (NORCO) 10-325 MG tablet Take 1 tablet by mouth 4 (four) times daily as needed for moderate pain.  (Patient not taking: No sig reported)     No current facility-administered medications for this  visit.   Facility-Administered Medications Ordered in Other Visits  Medication Dose Route Frequency Provider Last Rate Last Admin   0.9 %  sodium chloride infusion   Intravenous Continuous Lequita Asal, MD 10 mL/hr at 12/11/19 1123 New Bag at 12/11/19 1123    Review of  Systems  Constitutional:  Negative for chills, fever, malaise/fatigue and weight loss.  HENT:  Negative for hearing loss, nosebleeds, sore throat and tinnitus.   Eyes:  Negative for blurred vision and double vision.  Respiratory:  Negative for cough, hemoptysis, shortness of breath and wheezing.   Cardiovascular:  Negative for chest pain, palpitations and leg swelling.  Gastrointestinal:  Negative for abdominal pain, blood in stool, constipation, diarrhea, melena, nausea and vomiting.  Genitourinary:  Negative for dysuria and urgency.  Musculoskeletal:  Negative for back pain, falls, joint pain and myalgias.  Skin:  Negative for itching and rash.  Neurological:  Negative for dizziness, tingling, sensory change, loss of consciousness, weakness and headaches.  Endo/Heme/Allergies:  Negative for environmental allergies. Does not bruise/bleed easily.  Psychiatric/Behavioral:  Negative for depression. The patient is not nervous/anxious and does not have insomnia.   Performance status (ECOG):  2  Vitals Blood pressure 118/63, pulse 72, temperature 98.1 F (36.7 C), resp. rate 14, weight 157 lb 10.1 oz (71.5 kg), SpO2 97 %.  General: frail. Appears stated age. Rolling walker.  Eyes: Pink conjunctiva, anicteric sclera. Lungs: No audible wheezing or coughing Heart: Regular rate and rhythm.  Abdomen: Soft, nontender, nondistended.  Musculoskeletal: No edema, cyanosis, or clubbing. Neuro: Alert, answering all questions appropriately.  Skin: No rashes or petechiae noted. Psych: Normal affect.   No visits with results within 3 Day(s) from this visit.  Latest known visit with results is:  Abstract on 09/10/2020  Component Date Value Ref Range Status   Ferritin 09/08/2020 486   Final   Iron 09/08/2020 39   Final   TIBC 09/08/2020 253   Final   Hemoglobin 09/08/2020 8.0* 13.5 - 17.5 Final   HCT 09/08/2020 25* 41 - 53 Final   Platelets 09/08/2020 130* 150 - 399 Final   WBC 09/08/2020 3.5    Final   RBC 09/08/2020 2.65* 3.87 - 5.11 Final    Assessment:  1. Iron Deficiency Anemia - hemoglobin 9.9, normocytic. Iron saturation 19%, TIBC 302, ferritin 306. No indication for iron today.  2. Thrombocytopenia- platelet count 118 (low. Previously 130. 173000 in March. Possibly related to plaquenil. Asymptomatic. If counts conitnue to drop, consider work-up. Monitor.  3. Leukopenia. Wbc normal today. ALC low at 0.6 but stable. If counts decline persistently, consider bone marrow biopsy. Monitor.   Plan:  No Venofer today. 3 mo- labs (cbc, ferritin, iron studies), MD to establish care, possible venofer  Patient lives in [place] and agrees to visits at either location.    I discussed the assessment and treatment plan with the patient.  The patient was provided an opportunity to ask questions and all were answered.  The patient agreed with the plan and demonstrated an understanding of the instructions.  The patient was advised to call back if the symptoms worsen or if the condition fails to improve as anticipated.  Beckey Rutter, DNP, AGNP-C [place] at [facility] [phone] (clinic)

## 2020-10-01 ENCOUNTER — Other Ambulatory Visit: Payer: Self-pay | Admitting: Family Medicine

## 2020-10-06 ENCOUNTER — Encounter: Payer: Self-pay | Admitting: Hematology and Oncology

## 2020-10-07 ENCOUNTER — Encounter: Payer: Self-pay | Admitting: Oncology

## 2020-10-13 ENCOUNTER — Ambulatory Visit: Payer: Medicare Other

## 2020-11-14 ENCOUNTER — Encounter: Payer: Self-pay | Admitting: Hematology and Oncology

## 2020-12-03 ENCOUNTER — Other Ambulatory Visit: Payer: Self-pay | Admitting: Family Medicine

## 2020-12-15 ENCOUNTER — Encounter: Payer: Self-pay | Admitting: Cardiovascular Disease

## 2020-12-15 ENCOUNTER — Encounter: Payer: Self-pay | Admitting: Gastroenterology

## 2020-12-15 ENCOUNTER — Ambulatory Visit (INDEPENDENT_AMBULATORY_CARE_PROVIDER_SITE_OTHER): Payer: Medicare Other | Admitting: Gastroenterology

## 2020-12-15 ENCOUNTER — Other Ambulatory Visit: Payer: Self-pay

## 2020-12-15 ENCOUNTER — Ambulatory Visit (INDEPENDENT_AMBULATORY_CARE_PROVIDER_SITE_OTHER): Payer: Medicare Other | Admitting: Cardiovascular Disease

## 2020-12-15 VITALS — BP 129/65 | HR 54 | Temp 98.3°F | Ht 71.0 in | Wt 160.8 lb

## 2020-12-15 VITALS — BP 126/64 | HR 62 | Ht 71.0 in | Wt 160.0 lb

## 2020-12-15 DIAGNOSIS — I7121 Aneurysm of the ascending aorta, without rupture: Secondary | ICD-10-CM

## 2020-12-15 DIAGNOSIS — K909 Intestinal malabsorption, unspecified: Secondary | ICD-10-CM

## 2020-12-15 DIAGNOSIS — E782 Mixed hyperlipidemia: Secondary | ICD-10-CM

## 2020-12-15 DIAGNOSIS — Z952 Presence of prosthetic heart valve: Secondary | ICD-10-CM

## 2020-12-15 DIAGNOSIS — I272 Pulmonary hypertension, unspecified: Secondary | ICD-10-CM

## 2020-12-15 DIAGNOSIS — I10 Essential (primary) hypertension: Secondary | ICD-10-CM

## 2020-12-15 DIAGNOSIS — R197 Diarrhea, unspecified: Secondary | ICD-10-CM | POA: Diagnosis not present

## 2020-12-15 DIAGNOSIS — I712 Thoracic aortic aneurysm, without rupture: Secondary | ICD-10-CM

## 2020-12-15 DIAGNOSIS — I4821 Permanent atrial fibrillation: Secondary | ICD-10-CM | POA: Diagnosis not present

## 2020-12-15 DIAGNOSIS — I5032 Chronic diastolic (congestive) heart failure: Secondary | ICD-10-CM

## 2020-12-15 DIAGNOSIS — I359 Nonrheumatic aortic valve disorder, unspecified: Secondary | ICD-10-CM

## 2020-12-15 NOTE — Patient Instructions (Addendum)
Medication Instructions:  No changes  If you need a refill on your cardiac medications before your next appointment, please call your pharmacy.    Lab work: No new labs needed   If you have labs (blood work) drawn today and your tests are completely normal, you will receive your results only by: [place] (if you have MyChart) OR A paper copy in the mail If you have any lab test that is abnormal or we need to change your treatment, we will call you to review the results.   Testing/Procedures: - Your physician has requested that you have an echocardiogram in December 2022. Echocardiography is a painless test that uses sound waves to create images of your heart. It provides your doctor with information about the size and shape of your heart and how well your heart's chambers and valves are working. This procedure takes approximately one hour. There are no restrictions for this procedure.   Follow-Up: At [facility], you and your health needs are our priority.  As part of our continuing mission to provide you with exceptional heart care, we have created designated Provider Care Teams.  These Care Teams include your primary Cardiologist (physician) and Advanced Practice Providers (APPs -  Physician Assistants and Nurse Practitioners) who all work together to provide you with the care you need, when you need it.  You will need a follow up appointment in 12 months  Providers on your designated Care Team:   Murray Hodgkins, NP Christell Faith, PA-C Marrianne Mood, PA-C Cadence Kathlen Mody, [place]  Any Other Special Instructions Will Be Listed Below (If Applicable).  COVID-19 Vaccine Information can be found at: [email or web address] For questions related to vaccine distribution or appointments, please email [email or web address] or call [phone].

## 2020-12-15 NOTE — Progress Notes (Signed)
Date:  12/15/2020   ID:  Bryan [name], DOB 10-02-1940, MRN [id number]  Patient Location:  [address] [place] [address]   Provider location:   [facility], [place] office  PCP:  [name], MD  Cardiologist:  Arvid Right [facility]  Chief Complaint  Patient presents with   Other    6 month follow up. Meds reviewed verbally with patient.     History of Present Illness:    Bryan [name] is a 80 y.o. 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 bicuspid aortic valve noted in 2009   cardiac catheterization showing mild to moderate CAD,  AVR with bioprosthetic valve, ascending aorta grafting in 2010,  hypertension,  hyperlipidemia,  mild chronic renal insufficiency, total knee replacement in 2004, appendix rupture and gallbladder surgery in 2010,  postoperative atrial fibrillation in 2010 pulm HTN on echo 2017 Valve with mean gradient 20 mm Hg, moderate, prosthetic valve who presents for routine follow-up of his bioprosthetic aortic valve and dilated ascending aorta , permanent atrial fibrillation  Last seen in clinic January 2022 On that visit had restarted amlodipine on his own for high pressure Weight was up 10 pounds, eating better  Neuropathy in his feet, chronic Unable to walk very far, chronic knee pain Prior history of falls, giving out Lives by himself in an apartment  Echocardiogram June 2021 normal LV function,  moderate aortic valve stenosis  ABD surgery at [facility] to rehab Taking imodium for diarrhea several times a day  On eliquis Off lasix BP stable  No edema Weight drop after surgery, recovering now Lost some muscle  Chronic back pain, sees pain management in [place] Right knee pain   EKG personally reviewed by myself on todays visit Atrial fibrillation rate 62 bpm no significant ST-T wave changes  Other past medical history reviewed  Event  monitor Atrial Fibrillation occurred continuously (100% burden), ranging from 31- 134 bpm (avg of 62 bpm). Slow ventricular rate noted overnight, early a.m. Isolated VEs were rare (<1.0%), and no VE Couplets or VE Triplets were present. 1 patient triggered event, not associated with significant arrhythmia other than atrial fibrillation  confusion January 09, 2020, was seen in the emergency room was not making sense, had a headache, was vomiting Was seen in the emergency room but left secondary to high patient volume and he was feeling better Feels back to his baseline today  Seen in CHF clinic January 02, 2020, weight at that time 162 pounds Weight  November 23, 2019 166 pounds Weight April 2021 was 160 pounds Weight in oncology 178 pounds performed yesterday His home weight today 168 pounds but is 173 pounds in our office  Echocardiogram November 13, 2019 performed for shortness of breath Normal ejection fraction, moderate pulmonary hypertension Moderate aortic valve stenosis Mean gradient 26 mmHg Dilated IVC  CT scan chest June 2021 with mild/small pleural effusion  Chronic back pain, multiple surgery "better" had hardware taken out Several back surgeries Back surgery  5/18 chroni back and leg pain, Fx of L4, Cement  Fusion  Past Medical History:  Diagnosis Date   Abnormal CT scan    Asymmetric left rectal wall thickening    Anemia    Anxiety    Arrhythmia    Basal cell carcinoma 04/28/2020   L forehead - ED&C 06/17/2020   CHF (congestive heart failure) (HCC)    Chronic lower back pain  Complication of anesthesia    Memory loss 09/2015   Coronary artery disease    Depression    GERD (gastroesophageal reflux disease)    Glaucoma    H/O thoracic aortic aneurysm repair    Headache    Heart murmur    History of being hospitalized    memory lose kidney funtion down blood pressure up   History of blood transfusion    "think he had one when he had heart valve OR" (10/14/2016);  "none since" (10/19/2017)   History of chicken pox    Hyperlipidemia    Hypertension    Lumbar stenosis    Murmur    Neuropathy    Osteoarthritis    worse in feet and ankles   Paroxysmal A-fib ([place])    Poor short term memory    takes Aricept   Rheumatoid arthritis ([place])    "all over" (10/14/2016)   Schizophrenia ([place])    Seasonal allergies    Thoracic spine pain    Valvular heart disease    Wears glasses    reading   Past Surgical History:  Procedure Laterality Date   AORTIC VALVE REPLACEMENT  2007   [facility].  Supply, [place]; "pig valve"   APPENDECTOMY  2010   BACK SURGERY     CARDIAC VALVE REPLACEMENT     CATARACT EXTRACTION W/ INTRAOCULAR LENS  IMPLANT, BILATERAL Bilateral 2012   COLONOSCOPY WITH PROPOFOL N/A 09/24/2016   Procedure: COLONOSCOPY WITH PROPOFOL;  Surgeon: Jonathon Bellows, MD;  Location: [facility] ENDOSCOPY;  Service: Endoscopy;  Laterality: N/A;   ESOPHAGOGASTRODUODENOSCOPY (EGD) WITH PROPOFOL N/A 09/24/2016   Procedure: ESOPHAGOGASTRODUODENOSCOPY (EGD) WITH PROPOFOL;  Surgeon: Jonathon Bellows, MD;  Location: [facility] ENDOSCOPY;  Service: Endoscopy;  Laterality: N/A;   GIVENS CAPSULE STUDY N/A 09/24/2016   Procedure: GIVENS CAPSULE STUDY;  Surgeon: Jonathon Bellows, MD;  Location: [facility] ENDOSCOPY;  Service: Endoscopy;  Laterality: N/A;   HAMMER TOE SURGERY Right 10/09/2015   Procedure: HAMMER TOE REPAIR WITH K-WIRE FIXATION RIGHT SECOND TOE;  Surgeon: Albertine Patricia, DPM;  Location: [place];  Service: Podiatry;  Laterality: Right;  WITH LOCAL   HARDWARE REMOVAL N/A 11/02/2019   Procedure: Removal of hardware thoracic spine;  Surgeon: Kary Kos, MD;  Location: [place];  Service: Neurosurgery;  Laterality: N/A;  posterior   INGUINAL HERNIA REPAIR Right 05/05/2018   Medium Bard PerFix plug.  Surgeon: Robert Bellow, MD;  Location: [facility] ORS;  Service: General;  Laterality: Right;   IR VERTEBROPLASTY CERV/THOR BX INC UNI/BIL INC/INJECT/IMAGING  10/27/2018   JOINT REPLACEMENT  Left    left total knee   LAMINECTOMY WITH POSTERIOR LATERAL ARTHRODESIS LEVEL 3 N/A 09/28/2017   Procedure: LUMBAR  POSTEROR  FUSION REVISION - LUMBAR ONE -TWO, LUMBAR TWO -THREE,THREE-FOUR ,BILATERAL ARTHRODESIS REMOVAL LUMBAR THREE HARDWARE;  Surgeon: Kary Kos, MD;  Location: [place];  Service: Neurosurgery;  Laterality: N/A;   LAMINECTOMY WITH POSTERIOR LATERAL ARTHRODESIS LEVEL 3 N/A 10/20/2017   Procedure: Revision fusion and removal of hardware Lumbar one, Pedicle screw fixation thoracic ten-lumbar two, thoracic nine-ten laminotomy, Posterior Lumbar Arthrodesis thoracic ten-lumbar two ;  Surgeon: Kary Kos, MD;  Location: [place];  Service: Neurosurgery;  Laterality: N/A;   LAPAROSCOPIC CHOLECYSTECTOMY  2010   LUMBAR FUSION Right 06/08/2017   LUMBAR THREE-FOUR, LUMBAR FOUR-FIVE POSTEROLATERAL ARTHRODESIS WITH RIGHT LUMBAR FOUR-FIVE LAMINECTOMY/FORAMINOTOMY   LUMBAR LAMINECTOMY/DECOMPRESSION MICRODISCECTOMY Right 03/08/2016   Procedure: Laminectomy and Foraminotomy - Lumbar Five-Sacral One Right;  Surgeon: Kary Kos, MD;  Location: [facility]  OR;  Service: Neurosurgery;  Laterality: Right;  Right   MULTIPLE TOOTH EXTRACTIONS     "3"   POSTERIOR LUMBAR FUSION  10/14/2016   L5-S1   REPLACEMENT TOTAL KNEE Left 2003   THORACIC AORTIC ANEURYSM REPAIR  2010     Current Meds  Medication Sig   amLODipine (NORVASC) 5 MG tablet Take 1 tablet (5 mg total) by mouth daily.   ARIPiprazole (ABILIFY) 5 MG tablet Take by mouth.   atorvastatin (LIPITOR) 40 MG tablet TAKE ONE TABLET BY MOUTH EVERY DAY   calcium carbonate (OS-CAL - DOSED IN MG OF ELEMENTAL CALCIUM) 1250 (500 Ca) MG tablet Take 1 tablet by mouth daily with lunch.   cetirizine (ZYRTEC) 10 MG tablet Take 10 mg by mouth daily.   diphenhydrAMINE (SOMINEX) 25 MG tablet Take by mouth.   donepezil (ARICEPT) 10 MG tablet Take by mouth.   ELIQUIS 5 MG TABS tablet TAKE 1 TABLET BY MOUTH TWICE DAILY   fenofibrate 160 MG tablet TAKE ONE TABLET AT BEDTIME    FLUoxetine (PROZAC) 20 MG capsule TAKE 1 CAPSULE BY MOUTH ONCE DAILY   gabapentin (NEURONTIN) 300 MG capsule TAKE 1 CAPSULE BY MOUTH 3 TIMES DAILY.   HYDROcodone-acetaminophen (NORCO/VICODIN) 5-325 MG tablet Take 1 tablet by mouth 4 (four) times daily as needed for moderate pain.   hydrocortisone cream 1 % Apply 1 application topically daily as needed for itching.   hydroxychloroquine (PLAQUENIL) 200 MG tablet Take 200 mg by mouth 3 (three) times a week. Tue, Thurs, and Sat   lisinopril (ZESTRIL) 20 MG tablet Take 1.5 tablets (30 mg total) by mouth every evening.   loperamide (IMODIUM) 2 MG capsule Take by mouth.   Multiple Vitamins-Minerals (PRESERVISION AREDS 2) CAPS Take 1 capsule by mouth 2 (two) times daily.   omeprazole (PRILOSEC) 40 MG capsule Take 1 capsule (40 mg total) by mouth daily.   traZODone (DESYREL) 50 MG tablet TAKE ONE TO TWO TABLETS BY MOUTH AT BEDTIME AS NEEDED   vitamin B-12 (CYANOCOBALAMIN) 1000 MCG tablet Take 1,000 mcg by mouth daily with lunch.    Vitamin D, Ergocalciferol, (DRISDOL) 1.25 MG (50000 UNIT) CAPS capsule Take 50,000 Units by mouth once a week.   vitamin E 180 MG (400 UNITS) capsule Take 800 Units by mouth daily with lunch.     Allergies:   Penicillins, Demerol [meperidine], and Other   Social History   Tobacco Use   Smoking status: Former    Packs/day: 1.00    Years: 32.00    Pack years: 32.00    Types: Cigarettes    Quit date: 07/05/1981    Years since quitting: 39.4   Smokeless tobacco: Never  Vaping Use   Vaping Use: Never used  Substance Use Topics   Alcohol use: Yes    Comment: "glass of wine/month; if that"   Drug use: Never     Family Hx: The patient's family history includes Asthma in his mother; Heart attack (age of onset: 58) in his father; Heart failure in his mother; Hypertension in his mother and sister; Osteoporosis in his mother. There is no history of Prostate cancer, Bladder Cancer, Kidney cancer, or Colon cancer.  ROS:    Please see the history of present illness.    Review of Systems  Constitutional:  Positive for weight loss.  HENT: Negative.    Respiratory: Negative.    Cardiovascular: Negative.   Gastrointestinal: Negative.   Musculoskeletal: Negative.   Neurological: Negative.   Psychiatric/Behavioral: Negative.  All other systems reviewed and are negative.   Labs/Other Tests and Data Reviewed:    Recent Labs: 02/22/2020: TSH 1.81 08/26/2020: ALT 12; BUN 20; Creatinine, Ser 0.92; Magnesium 1.6; Potassium 5.0; Sodium 137 09/08/2020: Hemoglobin 8.0; Platelets 130   Recent Lipid Panel Lab Results  Component Value Date/Time   CHOL 88 08/22/2020 12:07 PM   CHOL 123 09/11/2019 03:38 PM   TRIG 132.0 08/22/2020 12:07 PM   HDL 38.40 (L) 08/22/2020 12:07 PM   HDL 58 09/11/2019 03:38 PM   CHOLHDL 2 08/22/2020 12:07 PM   LDLCALC 23 08/22/2020 12:07 PM   LDLCALC 47 09/11/2019 03:38 PM    Wt Readings from Last 3 Encounters:  12/15/20 160 lb (72.6 kg)  09/30/20 157 lb 10.1 oz (71.5 kg)  09/11/20 160 lb 6.4 oz (72.8 kg)     Exam:    BP 126/64 (BP Location: Right Arm, Patient Position: Sitting, Cuff Size: Normal)   Pulse 62   Ht '5\' 11"'$  (1.803 m)   Wt 160 lb (72.6 kg)   SpO2 98%   BMI 22.32 kg/m  Constitutional:  oriented to person, place, and time. No distress.  HENT:  Head: Grossly normal Eyes:  no discharge. No scleral icterus.  Neck: No JVD, no carotid bruits  Cardiovascular: Regular rate and rhythm, no murmurs appreciated Pulmonary/Chest: Clear to auscultation bilaterally, no wheezes or rails Abdominal: Soft.  no distension.  no tenderness.  Musculoskeletal: Normal range of motion Neurological:  normal muscle tone. Coordination normal. No atrophy Skin: Skin warm and dry Psychiatric: normal affect, pleasant   ASSESSMENT & PLAN:    Permanent atrial fibrillation (HCC) -  Permanent atrial fibrillation,  Tolerating Eliquis 5 mg twice daily Denies any recent falls Metoprolol held  previously for bradycardia   Essential hypertension - Plan: EKG 12-Lead Blood pressure is well controlled on today's visit. No changes made to the medications. stable   CAD in native artery - Plan: EKG 12-Lead Currently with no symptoms of angina. No further workup at this time. Continue current medication regimen.  Acute on chronic diastolic CHF Euvolemic on today's visit,  Not on lasix Strongly recommended he take Lasix for abdominal bloating or leg swelling   Aneurysm of ascending aorta, s/post repair/grafting Aortic root/ascending aorta has been repaired/replaced. There is mild  dilatation of the aortic root measuring 44 mm. Repeat echocardiogram ordered December 2022   Mixed hyperlipidemia Cholesterol is at goal on the current lipid regimen. No changes to the medications were made.  S/P AVR (aortic valve replacement) Stable on echocardiogram Moderate gradient,  mild murmur on exam, asymptomatic Will need periodic echo.  Last July 2021   Cognitive decline No family presenting with him today Lives alone, doing well on todays visit    Total encounter time more than 25 minutes  Greater than 50% was spent in counseling and coordination of care with the patient    Signed, Ida Rogue, MD  12/15/2020 1:49 PM    [place] [facility] [address] [place] [address], [place], [place] [address]

## 2020-12-15 NOTE — Progress Notes (Signed)
Jonathon Bellows MD, MRCP(U.K) [address]  [place]  [place], [place] [address]  Main: [phone]  Fax: [phone]   Primary Care Physician: [name], MD  Primary Gastroenterologist:  Dr. Jonathon Bellows   Chief Complaint  Patient presents with   IDA  Here to follow-up for diarrhea due to malabsorption  HPI: Bryan [name] is a 80 y.o. male   Summary of history :   Last seen on 08/26/2020   Seen back in 2018 for iron deficiency anemia. Colonoscopy 09/2016 showed a small polyp-adenoma . 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 He subsequently had an incomplete capsule study as the capsule did not exit the stomach for over 6 hours, did not follow up after  He was subsequently seen by Dr [name] for his RA  - plan to consider to start him on a biological agent . 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 E antigen status negative, Core antibody total negative ,E antibody negative.  Hepatitis B DNA not detected.  Hepatitis C antibody is negative. No recent HIV test available. He has never known to have hepatitis B.  Commenced him on tenofovir prior to initiation of anti-TNF.   He was admitted to [facility] in February 2022 with small bowel obstruction underwent surgery with right hemicolectomy and 60 cm terminal ileum removal.  500 cc bloody ascites and the distal part of the small bowel was necrotic.  Cecum was also ischemic.  The ventriculoseptal cirrhosis.  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  At his last visit with Dr. [name] complained of diarrhea.  Stool for GI PCR and C. difficile on 08/20/2020 was negative.  Hemoglobin on 08/18/2020 was 10 g.   He is on Eliquis for A. fib has diastolic heart failure, pulmonary hypertension and aortic stenosis with status post aortic valve replacement.     Diarrhea bgean  after his surgery.  Was taking 8 mg of Imodium daily with no significant relief.  He was losing weight.   Diet consists of foods with artificial          Interval history 09/11/2020-12/15/2020   Since his last visit he has reduced his Imodium to just 2 doses a day 4 mg twice a day along with decreasing his Prilosec to only once a day.  With this he is having good bowel movements.  No diarrhea he has been gaining weight.   Current Outpatient Medications  Medication Sig Dispense Refill   amLODipine (NORVASC) 5 MG tablet Take 1 tablet (5 mg total) by mouth daily. 90 tablet 3   ARIPiprazole (ABILIFY) 5 MG tablet Take by mouth.     atorvastatin (LIPITOR) 40 MG tablet TAKE ONE TABLET BY MOUTH EVERY DAY 90 tablet 3   calcium carbonate (OS-CAL - DOSED IN MG OF ELEMENTAL CALCIUM) 1250 (500 Ca) MG tablet Take 1 tablet by mouth daily with lunch.     cetirizine (ZYRTEC) 10 MG tablet Take 10 mg by mouth daily.     diphenhydrAMINE (SOMINEX) 25 MG tablet Take by mouth.     donepezil (ARICEPT) 10 MG tablet Take by mouth.     ELIQUIS 5 MG TABS tablet TAKE 1 TABLET BY MOUTH TWICE DAILY 180 tablet 2   fenofibrate 160 MG tablet TAKE ONE TABLET AT BEDTIME 90 tablet 3   FLUoxetine (PROZAC) 20 MG capsule TAKE 1 CAPSULE BY MOUTH ONCE DAILY 90 capsule 1   furosemide (LASIX) 40 MG tablet  Take 40 mg by mouth as needed.     gabapentin (NEURONTIN) 300 MG capsule TAKE 1 CAPSULE BY MOUTH 3 TIMES DAILY. 270 capsule 1   hydrALAZINE (APRESOLINE) 25 MG tablet Take by mouth.     HYDROcodone-acetaminophen (NORCO/VICODIN) 5-325 MG tablet Take 1 tablet by mouth 4 (four) times daily as needed for moderate pain.     hydrocortisone cream 1 % Apply 1 application topically daily as needed for itching.     hydroxychloroquine (PLAQUENIL) 200 MG tablet Take 200 mg by mouth 3 (three) times a week. Tue, Thurs, and Sat     lisinopril (ZESTRIL) 20 MG tablet Take 1.5 tablets (30 mg total) by mouth every evening. 135 tablet 1   loperamide (IMODIUM) 2 MG capsule Take by mouth.     Multiple Vitamins-Minerals (PRESERVISION AREDS 2) CAPS Take 1  capsule by mouth 2 (two) times daily.     omeprazole (PRILOSEC) 40 MG capsule Take 1 capsule (40 mg total) by mouth daily. 90 capsule 3   traZODone (DESYREL) 50 MG tablet TAKE ONE TO TWO TABLETS BY MOUTH AT BEDTIME AS NEEDED 90 tablet 1   vitamin B-12 (CYANOCOBALAMIN) 1000 MCG tablet Take 1,000 mcg by mouth daily with lunch.      Vitamin D, Ergocalciferol, (DRISDOL) 1.25 MG (50000 UNIT) CAPS capsule Take 50,000 Units by mouth once a week.     vitamin E 180 MG (400 UNITS) capsule Take 800 Units by mouth daily with lunch.     No current facility-administered medications for this visit.   Facility-Administered Medications Ordered in Other Visits  Medication Dose Route Frequency Provider Last Rate Last Admin   0.9 %  sodium chloride infusion   Intravenous Continuous Lequita Asal, MD 10 mL/hr at 12/11/19 1123 New Bag at 12/11/19 1123    Allergies as of 12/15/2020 - Review Complete 12/15/2020  Allergen Reaction Noted   Penicillins Hives, Swelling, and Other (See Comments) 11/06/2010   Demerol [meperidine] Hives and Nausea And Vomiting 11/27/2013   Other  08/12/2020    ROS:  General: Negative for anorexia, weight loss, fever, chills, fatigue, weakness. ENT: Negative for hoarseness, difficulty swallowing , nasal congestion. CV: Negative for chest pain, angina, palpitations, dyspnea on exertion, peripheral edema.  Respiratory: Negative for dyspnea at rest, dyspnea on exertion, cough, sputum, wheezing.  GI: See history of present illness. GU:  Negative for dysuria, hematuria, urinary incontinence, urinary frequency, nocturnal urination.  Endo: Negative for unusual weight change.    Physical Examination:   BP 129/65   Pulse (!) 54   Temp 98.3 F (36.8 C) (Oral)   Ht '5\' 11"'$  (1.803 m)   Wt 160 lb 12.8 oz (72.9 kg)   BMI 22.43 kg/m   General: Well-nourished, well-developed in no acute distress.  Eyes: No icterus. Conjunctivae pink. Mouth: Oropharyngeal mucosa moist and pink , no  lesions erythema or exudate. Neuro: Alert and oriented x 3.  Grossly intact. Skin: Warm and dry, no jaundice.   Psych: Alert and cooperative, normal mood and affect.   Imaging Studies: No results found.  Assessment and Plan:   Bryan [name] is a 80 y.o. y/o male here to follow up  for severe diarrhea status post resection of 60 cm of the terminal ileum after an episode of ischemic enteritis.  He also underwent a right hemicolectomy at that point of time.  The diarrhea is most likely secondary to surgery, absence of the ileocecal valve makes transit of the small bowel contents to the large  intestine shorter.  Medications such as Plaquenil can also cause diarrhea.  Since I increased his Imodium dose, commenced him on high-dose PPI, changed his diet to include complex carbohydrates rather than simple sugars and diet low in fat, stopped beverages such as tea coffee and soda, he has had complete resolution of the diarrhea.  Presently he has 1-2 formed bowel movements a day on 8 mg of Imodium a day and Prilosec 40 mg once a day.  He is unable to dial down his Imodium as a lower dose needs to diarrhea.  I have suggested him to continue with this dose.  Down the road if he has further recurrence of diarrhea can consider treatment with Xifaxan or holding Plaquenil if needed as a last option.  Continue high-fiber diet        Dr Jonathon Bellows  MD,MRCP [facility]) Follow up in as needed

## 2020-12-19 ENCOUNTER — Ambulatory Visit: Payer: Medicare Other

## 2020-12-25 ENCOUNTER — Telehealth: Payer: Self-pay

## 2020-12-25 ENCOUNTER — Ambulatory Visit: Payer: Medicare Other

## 2020-12-25 NOTE — Telephone Encounter (Signed)
No answer when called for scheduled AWV. Unable to leave a message. Reschedule as appropriate.

## 2020-12-31 ENCOUNTER — Ambulatory Visit: Payer: Medicare Other | Admitting: Oncology

## 2020-12-31 ENCOUNTER — Other Ambulatory Visit: Payer: Medicare Other

## 2021-01-02 ENCOUNTER — Telehealth: Payer: Self-pay | Admitting: Oncology

## 2021-01-02 ENCOUNTER — Inpatient Hospital Stay: Payer: Medicare Other

## 2021-01-02 ENCOUNTER — Inpatient Hospital Stay: Payer: Medicare Other | Admitting: Oncology

## 2021-01-02 NOTE — Telephone Encounter (Signed)
Patient's sister called to r/s his appointment today 8/12. She was agreeable to take him in to see the NP on 8/16.

## 2021-01-03 ENCOUNTER — Other Ambulatory Visit: Payer: Self-pay | Admitting: Family Medicine

## 2021-01-06 ENCOUNTER — Inpatient Hospital Stay: Payer: Medicare Other

## 2021-01-06 ENCOUNTER — Inpatient Hospital Stay (HOSPITAL_BASED_OUTPATIENT_CLINIC_OR_DEPARTMENT_OTHER): Payer: Medicare Other | Admitting: Nurse Practitioner

## 2021-01-06 ENCOUNTER — Inpatient Hospital Stay: Payer: Medicare Other | Attending: Nurse Practitioner | Admitting: Oncology

## 2021-01-06 ENCOUNTER — Other Ambulatory Visit: Payer: Medicare Other

## 2021-01-06 ENCOUNTER — Ambulatory Visit: Payer: Medicare Other | Admitting: Nurse Practitioner

## 2021-01-06 DIAGNOSIS — R718 Other abnormality of red blood cells: Secondary | ICD-10-CM | POA: Diagnosis not present

## 2021-01-06 DIAGNOSIS — D509 Iron deficiency anemia, unspecified: Secondary | ICD-10-CM | POA: Insufficient documentation

## 2021-01-06 DIAGNOSIS — Z87891 Personal history of nicotine dependence: Secondary | ICD-10-CM | POA: Insufficient documentation

## 2021-01-06 DIAGNOSIS — D696 Thrombocytopenia, unspecified: Secondary | ICD-10-CM

## 2021-01-06 DIAGNOSIS — I509 Heart failure, unspecified: Secondary | ICD-10-CM | POA: Insufficient documentation

## 2021-01-06 DIAGNOSIS — M069 Rheumatoid arthritis, unspecified: Secondary | ICD-10-CM | POA: Insufficient documentation

## 2021-01-06 DIAGNOSIS — D649 Anemia, unspecified: Secondary | ICD-10-CM | POA: Diagnosis not present

## 2021-01-06 DIAGNOSIS — K219 Gastro-esophageal reflux disease without esophagitis: Secondary | ICD-10-CM | POA: Insufficient documentation

## 2021-01-06 DIAGNOSIS — I2510 Atherosclerotic heart disease of native coronary artery without angina pectoris: Secondary | ICD-10-CM | POA: Diagnosis not present

## 2021-01-06 DIAGNOSIS — Z79899 Other long term (current) drug therapy: Secondary | ICD-10-CM | POA: Insufficient documentation

## 2021-01-06 DIAGNOSIS — Z7901 Long term (current) use of anticoagulants: Secondary | ICD-10-CM | POA: Diagnosis not present

## 2021-01-06 DIAGNOSIS — E785 Hyperlipidemia, unspecified: Secondary | ICD-10-CM | POA: Diagnosis not present

## 2021-01-06 DIAGNOSIS — F419 Anxiety disorder, unspecified: Secondary | ICD-10-CM | POA: Diagnosis not present

## 2021-01-06 DIAGNOSIS — D61818 Other pancytopenia: Secondary | ICD-10-CM | POA: Diagnosis not present

## 2021-01-06 DIAGNOSIS — I480 Paroxysmal atrial fibrillation: Secondary | ICD-10-CM | POA: Insufficient documentation

## 2021-01-06 DIAGNOSIS — I110 Hypertensive heart disease with heart failure: Secondary | ICD-10-CM | POA: Diagnosis not present

## 2021-01-06 LAB — CBC WITH DIFFERENTIAL/PLATELET
Abs Immature Granulocytes: 0.02 10*3/uL (ref 0.00–0.07)
Basophils Absolute: 0 10*3/uL (ref 0.0–0.1)
Basophils Relative: 1 %
Eosinophils Absolute: 0.2 10*3/uL (ref 0.0–0.5)
Eosinophils Relative: 4 %
HCT: 27.3 % — ABNORMAL LOW (ref 39.0–52.0)
Hemoglobin: 8.7 g/dL — ABNORMAL LOW (ref 13.0–17.0)
Immature Granulocytes: 1 %
Lymphocytes Relative: 22 %
Lymphs Abs: 0.9 10*3/uL (ref 0.7–4.0)
MCH: 30.5 pg (ref 26.0–34.0)
MCHC: 31.9 g/dL (ref 30.0–36.0)
MCV: 95.8 fL (ref 80.0–100.0)
Monocytes Absolute: 0.4 10*3/uL (ref 0.1–1.0)
Monocytes Relative: 11 %
Neutro Abs: 2.5 10*3/uL (ref 1.7–7.7)
Neutrophils Relative %: 61 %
Platelets: 108 10*3/uL — ABNORMAL LOW (ref 150–400)
RBC: 2.85 MIL/uL — ABNORMAL LOW (ref 4.22–5.81)
RDW: 13.5 % (ref 11.5–15.5)
WBC: 4 10*3/uL (ref 4.0–10.5)
nRBC: 0 % (ref 0.0–0.2)

## 2021-01-06 LAB — IRON AND TIBC
Iron: 46 ug/dL (ref 45–182)
Saturation Ratios: 14 % — ABNORMAL LOW (ref 17.9–39.5)
TIBC: 333 ug/dL (ref 250–450)
UIBC: 287 ug/dL

## 2021-01-06 LAB — SEDIMENTATION RATE: Sed Rate: 29 mm/hr — ABNORMAL HIGH (ref 0–20)

## 2021-01-06 LAB — FERRITIN: Ferritin: 113 ng/mL (ref 24–336)

## 2021-01-06 NOTE — Progress Notes (Signed)
[facility]  [address] [place], [place] [address] Phone: [phone]  Fax: [phone]   Clinic Day:  01/06/2021    Referring physician: [name], MD  Chief Complaint: Iron deficiency anemia and mild pancytopenia  Hematology History:   Bryan [name] is a 80 y.o. male with rheumatoid arthritis and progressive anemia over the past 2 years.  He has been on Plaquenil and hydralazine which can cause anemia.  Plaquenil was discontinued on 08/26/2016.  He denies any exposure to radiation or toxins.  He denies any prior history of hepatitis, prior transfusions or HIV risk factors.  He denies any herbal products.   Diet is good.  EGD on 09/24/2016 revealed patchy candidiasis in the entire esophagus.  There were two non-bleeding angioectasias in the duodenum treated with argon plasma coagulation.  Gastritis was biopsied.  Pathology revealed mild chronic gastritis negative for H pylori, dysplasia and malignancy.  Colonoscopy on 09/24/2016 revealed diverticulosis in the entire colon, one 3 mm polyp in the cecum, and non-bleeding internal hemorrhoids.  Pathology from the cecal polyp revealed a tubular adenoma negative for high grade dysplasia and malignancy.     He denies any melena or hematochezia. He denies any hematuria.   CBC on 06/02/2016 revealed a hematocrit of 29.5, hemoglobin 10.0, MCV 92.3, platelets 155,000, and WBC 4200.  Stool was guaiac negative x 2 in 06/2016.  Labs on 06/04/2016 revealed a ferritin of 124, iron saturation 22% and TIBC of 370.  B12 was 883 on 06/18/2014.   Work-up on 07/19/2016 revealed a hematocrit of 31.7, hemoglobin 10.6, MCV 91, platelets 143,000, white count 3000 with an ANC of 1800.  Absolute lymphocyte count was 700 (low). Creatinine was 1.57.  LDH was 269 (98-192).  Normal labs included:  uric acid, folate, [place], SPEP, and copper.  Iron studies included a saturation of 11% (low) and a TIBC of 454 (high) c/w  iron deficiency anemia.  Sed rate was 19.  Retic was 0.9% (low).  He is on oral iron with vitamin C.   Chest, abdomen, and pelvic CT on 08/09/2016 revealed no acute findings or clear evidence of malignancy in the chest, abdomen or pelvis.  There was asymmetric left rectal wall thickening possibly secondary to volume averaging with the prostate gland.   Bone marrow aspirate and biopsy on 11/04/2016 revealed a normocellular marrow (20%) with trilineage hematopoiesis and maturation.  There was mild megakaryocytic atypia.  Storage iron was present.  There were rare ringed sideroblasts.  Cytogenetics and FISH were normal (46, XY).   He was admitted to [facility] on 10/14/2016 - 10/18/2016 for spinal stenosis of the lumbar region.  He underwent redo decompressive lumbar laminotomy L5-S1 with radical foraminotomy the L5 and S1 nerve root with complete medial facetectomies. He underwent back surgery (revision fusion removal hardware and pedicle screw fixation) on 10/20/2017.   He was admitted to [facility] from 10/28/2017 - 10/31/2017 with symptomatic anemia.  He received 2 units of PRBCs.  Hemoglobin improved from 6.2 to 8.1.   Hospital work-up revealed the following normal labs: ferritin (65), B12 (401), TSH, SPEP, haptoglobin.  Testosterone was < 3 (low).  Iron saturation was 7% with a TIBC of 317.  LDH was 205 (98-192).  Retic was 2.6%.  Creatinine was 1.37 (CrCl 47.3 ml/min).   Peripheral smear revealed leukopenia with normal WBC morphology. Normocytic anemia with polychromasia, anisocytosis and rouleaux formation.    Work-up on 11/08/2017 revealed a hematocrit of 27.2, hemoglobin 9.0, platelets  200,000, WBC 5800 with an [place] of 4100.  Ferritin was 86.  Iron saturation was 6% with a TIBC of 382.  Sed rate was 56 (0-20).  Cold agglutinins were negative.  Reticulocyte count was 1.2%.  SPEP revealed no monoclonal protein on 10/31/2017.   He received weekly Venofer x 2 (11/25/2017 - 12/02/2017), x 2 (12/23/2017 -  12/30/2017), and x 3 (12/11/2019 - 12/25/2019), and x 2 (01/22/2020 - 01/29/2020).   Ferritin has been followed: 106 on 06/24/2017, 65 on 10/30/2017, 86 on 11/08/2017, 124 on 12/12/2017, 106 on 12/15/2017, 95 on 01/20/2018, 133 on 03/10/2018, 90 on 12/19/2018, 138 on 06/20/2019, 180 on 11/22/2019, 106 on 12/05/2019, 125 on 01/14/2020, 205 on 02/12/2020, 135 on 05/19/2020, and 165 on 08/12/2020.  Iron saturation was 6% on 06/24/2017, 6% on 01/20/2018, 11% on 12/05/2019, 14% on 01/14/2020, 19% on 05/19/2020, and 8% on 08/12/2020.  Sed rate was 56 on 11/08/2017, 64 on 12/05/2017, 40 on 11/22/2019, 17 on 01/14/2020, 10 on 05/19/2020 and 47 on 08/13/2020.   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  He has treated with Septra then switched to doxycycline.  He underwent back surgery on 10/20/2017.  The patient was admitted to [facility] from 11/12/2019-11/14/2019 for hemoptysis, pulmonary edema, and acute hypoxemic respiratory failure. CXR revealed symmetric bilateral airspace disease be secondary to pulmonary edema or pneumonia.   The patient received the [place] COVID-19 vaccines in 05/2019.   HPI:  Patient returns to clinic for further evaluation and follow up for iron deficiency anemia, thrombocytopenia, leukopenia. He has ongoing fatigue. Neuropathy is stable. Denies any neurologic complaints. Denies recent fevers or illnesses. Denies any easy bleeding or bruising. No melena or hematochezia. No pica or restless leg. Reports good appetite and denies weight loss. Denies chest pain. Denies any nausea, vomiting, constipation, or diarrhea. Denies urinary complaints. Patient offers no further specific complaints today.   Past Medical History:  Diagnosis Date   Abnormal CT scan    Asymmetric left rectal wall thickening    Anemia    Anxiety    Arrhythmia    Basal cell carcinoma 04/28/2020   L forehead - ED&C 06/17/2020   CHF (congestive heart failure) (HCC)    Chronic lower back pain     Complication of anesthesia    Memory loss 09/2015   Coronary artery disease    Depression    GERD (gastroesophageal reflux disease)    Glaucoma    H/O thoracic aortic aneurysm repair    Headache    Heart murmur    History of being hospitalized    memory lose kidney funtion down blood pressure up   History of blood transfusion    "think he had one when he had heart valve OR" (10/14/2016); "none since" (10/19/2017)   History of chicken pox    Hyperlipidemia    Hypertension    Lumbar stenosis    Murmur    Neuropathy    Osteoarthritis    worse in feet and ankles   Paroxysmal A-fib ([place])    Poor short term memory    takes Aricept   Rheumatoid arthritis ([place])    "all over" (10/14/2016)   Schizophrenia ([place])    Seasonal allergies    Thoracic spine pain    Valvular heart disease    Wears glasses    reading    Past Surgical History:  Procedure Laterality Date   AORTIC VALVE REPLACEMENT  2007   [facility].  Supply, [place] [employer]; "pig valve"  APPENDECTOMY  2010   BACK SURGERY     CARDIAC VALVE REPLACEMENT     CATARACT EXTRACTION W/ INTRAOCULAR LENS  IMPLANT, BILATERAL Bilateral 2012   COLONOSCOPY WITH PROPOFOL N/A 09/24/2016   Procedure: COLONOSCOPY WITH PROPOFOL;  Surgeon: Jonathon Bellows, MD;  Location[facility] ENDOSCOPY;  Service: Endoscopy;  Laterality: N/A;   ESOPHAGOGASTRODUODENOSCOPY (EGD) WITH PROPOFOL N/A 09/24/2016   Procedure: ESOPHAGOGASTRODUODENOSCOPY (EGD) WITH PROPOFOL;  Surgeon: Jonathon Bellows, MD;  Location: [facility] ENDOSCOPY;  Service: Endoscopy;  Laterality: N/A;   GIVENS CAPSULE STUDY N/A 09/24/2016   Procedure: GIVENS CAPSULE STUDY;  Surgeon: Jonathon Bellows, MD;  Location: [facility] ENDOSCOPY;  Service: Endoscopy;  Laterality: N/A;   HAMMER TOE SURGERY Right 10/09/2015   Procedure: HAMMER TOE REPAIR WITH K-WIRE FIXATION RIGHT SECOND TOE;  Surgeon: Albertine Patricia, DPM;  Location: [place];  Service: Podiatry;  Laterality: Right;  WITH LOCAL   HARDWARE REMOVAL N/A 11/02/2019    Procedure: Removal of hardware thoracic spine;  Surgeon: Kary Kos, MD;  Location: [place];  Service: Neurosurgery;  Laterality: N/A;  posterior   INGUINAL HERNIA REPAIR Right 05/05/2018   Medium Bard PerFix plug.  Surgeon: Robert Bellow, MD;  Location: [facility];  Service: General;  Laterality: Right;   IR VERTEBROPLASTY CERV/THOR BX INC UNI/BIL INC/INJECT/IMAGING  10/27/2018   JOINT REPLACEMENT Left    left total knee   LAMINECTOMY WITH POSTERIOR LATERAL ARTHRODESIS LEVEL 3 N/A 09/28/2017   Procedure: LUMBAR  POSTEROR  FUSION REVISION - LUMBAR ONE -TWO, LUMBAR TWO -THREE,THREE-FOUR ,BILATERAL ARTHRODESIS REMOVAL LUMBAR THREE HARDWARE;  Surgeon: Kary Kos, MD;  Location: [place];  Service: Neurosurgery;  Laterality: N/A;   LAMINECTOMY WITH POSTERIOR LATERAL ARTHRODESIS LEVEL 3 N/A 10/20/2017   Procedure: Revision fusion and removal of hardware Lumbar one, Pedicle screw fixation thoracic ten-lumbar two, thoracic nine-ten laminotomy, Posterior Lumbar Arthrodesis thoracic ten-lumbar two ;  Surgeon: Kary Kos, MD;  Location: [place];  Service: Neurosurgery;  Laterality: N/A;   LAPAROSCOPIC CHOLECYSTECTOMY  2010   LUMBAR FUSION Right 06/08/2017   LUMBAR THREE-FOUR, LUMBAR FOUR-FIVE POSTEROLATERAL ARTHRODESIS WITH RIGHT LUMBAR FOUR-FIVE LAMINECTOMY/FORAMINOTOMY   LUMBAR LAMINECTOMY/DECOMPRESSION MICRODISCECTOMY Right 03/08/2016   Procedure: Laminectomy and Foraminotomy - Lumbar Five-Sacral One Right;  Surgeon: Kary Kos, MD;  Location: [place];  Service: Neurosurgery;  Laterality: Right;  Right   MULTIPLE TOOTH EXTRACTIONS     "3"   POSTERIOR LUMBAR FUSION  10/14/2016   L5-S1   REPLACEMENT TOTAL KNEE Left 2003   THORACIC AORTIC ANEURYSM REPAIR  2010    Family History  Problem Relation Age of Onset   Heart failure Mother    Hypertension Mother    Asthma Mother    Osteoporosis Mother    Heart attack Father 76       MI   Hypertension Sister    Prostate cancer Neg Hx    Bladder Cancer Neg Hx     Kidney cancer Neg Hx    Colon cancer Neg Hx     Social History:  reports that he quit smoking about 39 years ago. His smoking use included cigarettes. He has a 32.00 pack-year smoking history. He has never used smokeless tobacco. He reports current alcohol use. He reports that he does not use drugs. He smoked 1 pack a day x 12 years.  He stopped smoking 30 years ago.  He drinks a glass of wine occasionally.  He is a Software engineer.  [place] His wife  passed away in 06/2018. His friend is Stanford [place]. The patient is alone today.   Allergies:  Allergies  Allergen Reactions   Penicillins Hives, Swelling and Other (See Comments)    SWELLING REACTION UNSPECIFIED PATIENT HAS TAKEN AMOXICILLIN ON MED HX FROM [facility] PATIENT HAS HAD A PCN REACTION WITH IMMEDIATE RASH, FACIAL/TONGUE/THROAT SWELLING, SOB, OR LIGHTHEADEDNESS WITH HYPOTENSION:  #  #  #  YES  #  #  #   Has patient had a PCN reaction causing severe rash involving mucus membranes or skin necrosis: No Has patient had a PCN reaction that required hospitalization No Has patient had a PCN reaction occurring within the last 10 years: No   Demerol [Meperidine] Hives and Nausea And Vomiting   Other     Current Medications: Current Outpatient Medications  Medication Sig Dispense Refill   amLODipine (NORVASC) 5 MG tablet Take 1 tablet (5 mg total) by mouth daily. 90 tablet 3   ARIPiprazole (ABILIFY) 5 MG tablet Take by mouth.     atorvastatin (LIPITOR) 40 MG tablet TAKE ONE TABLET BY MOUTH EVERY DAY 90 tablet 3   calcium carbonate (OS-CAL - DOSED IN MG OF ELEMENTAL CALCIUM) 1250 (500 Ca) MG tablet Take 1 tablet by mouth daily with lunch.     cetirizine (ZYRTEC) 10 MG tablet Take 10 mg by mouth daily.     diphenhydrAMINE (SOMINEX) 25 MG tablet Take by mouth.     donepezil (ARICEPT) 10 MG tablet Take by mouth.     ELIQUIS 5 MG TABS tablet TAKE 1 TABLET BY MOUTH TWICE DAILY 180 tablet 2   fenofibrate  160 MG tablet TAKE ONE TABLET AT BEDTIME 90 tablet 3   FLUoxetine (PROZAC) 20 MG capsule TAKE 1 CAPSULE BY MOUTH ONCE DAILY 90 capsule 1   gabapentin (NEURONTIN) 300 MG capsule TAKE 1 CAPSULE BY MOUTH 3 TIMES DAILY. 270 capsule 1   hydrALAZINE (APRESOLINE) 25 MG tablet Take by mouth.     HYDROcodone-acetaminophen (NORCO/VICODIN) 5-325 MG tablet Take 1 tablet by mouth 4 (four) times daily as needed for moderate pain.     hydrocortisone cream 1 % Apply 1 application topically daily as needed for itching.     hydroxychloroquine (PLAQUENIL) 200 MG tablet Take 200 mg by mouth 3 (three) times a week. Tue, Thurs, and Sat     lisinopril (ZESTRIL) 20 MG tablet Take 1.5 tablets (30 mg total) by mouth every evening. 135 tablet 1   loperamide (IMODIUM) 2 MG capsule Take by mouth.     Multiple Vitamins-Minerals (PRESERVISION AREDS 2) CAPS Take 1 capsule by mouth 2 (two) times daily.     omeprazole (PRILOSEC) 40 MG capsule Take 1 capsule (40 mg total) by mouth daily. 90 capsule 3   traZODone (DESYREL) 50 MG tablet TAKE 1 OR 2 TABLETS BY MOUTH AT BEDTIME AS NEEDED. 90 tablet 1   vitamin B-12 (CYANOCOBALAMIN) 1000 MCG tablet Take 1,000 mcg by mouth daily with lunch.      Vitamin D, Ergocalciferol, (DRISDOL) 1.25 MG (50000 UNIT) CAPS capsule Take 50,000 Units by mouth once a week.     vitamin E 180 MG (400 UNITS) capsule Take 800 Units by mouth daily with lunch.     No current facility-administered medications for this visit.   Facility-Administered Medications Ordered in Other Visits  Medication Dose Route Frequency Provider Last Rate Last Admin   0.9 %  sodium chloride infusion   Intravenous Continuous Lequita Asal, MD 10 mL/hr at 12/11/19 1123 New Bag  at 12/11/19 1123    Review of Systems  Constitutional:  Negative for chills, fever, malaise/fatigue and weight loss.  HENT:  Negative for hearing loss, nosebleeds, sore throat and tinnitus.   Eyes:  Negative for blurred vision and double vision.   Respiratory:  Negative for cough, hemoptysis, shortness of breath and wheezing.   Cardiovascular:  Negative for chest pain, palpitations and leg swelling.  Gastrointestinal:  Negative for abdominal pain, blood in stool, constipation, diarrhea, melena, nausea and vomiting.  Genitourinary:  Negative for dysuria and urgency.  Musculoskeletal:  Negative for back pain, falls, joint pain and myalgias.  Skin:  Negative for itching and rash.  Neurological:  Negative for dizziness, tingling, sensory change, loss of consciousness, weakness and headaches.  Endo/Heme/Allergies:  Negative for environmental allergies. Does not bruise/bleed easily.  Psychiatric/Behavioral:  Negative for depression. The patient is not nervous/anxious and does not have insomnia.   Performance status (ECOG):  2  Vitals There were no vitals taken for this visit.  General: frail. Appears stated age. Rolling walker.  Eyes: Pink conjunctiva, anicteric sclera. Lungs: No audible wheezing or coughing Heart: Regular rate and rhythm.  Abdomen: Soft, nontender, nondistended.  Musculoskeletal: No edema, cyanosis, or clubbing. Neuro: Alert, answering all questions appropriately.  Skin: No rashes or petechiae noted. Psych: Normal affect.   Appointment on 01/06/2021  Component Date Value Ref Range Status   WBC 01/06/2021 4.0  4.0 - 10.5 K/uL Final   RBC 01/06/2021 2.85 (A) 4.22 - 5.81 MIL/uL Final   Hemoglobin 01/06/2021 8.7 (A) 13.0 - 17.0 g/dL Final   HCT 01/06/2021 27.3 (A) 39.0 - 52.0 % Final   MCV 01/06/2021 95.8  80.0 - 100.0 fL Final   MCH 01/06/2021 30.5  26.0 - 34.0 pg Final   MCHC 01/06/2021 31.9  30.0 - 36.0 g/dL Final   RDW 01/06/2021 13.5  11.5 - 15.5 % Final   Platelets 01/06/2021 108 (A) 150 - 400 K/uL Final   nRBC 01/06/2021 0.0  0.0 - 0.2 % Final   Neutrophils Relative % 01/06/2021 61  % Final   Neutro Abs 01/06/2021 2.5  1.7 - 7.7 K/uL Final   Lymphocytes Relative 01/06/2021 22  % Final   Lymphs Abs  01/06/2021 0.9  0.7 - 4.0 K/uL Final   Monocytes Relative 01/06/2021 11  % Final   Monocytes Absolute 01/06/2021 0.4  0.1 - 1.0 K/uL Final   Eosinophils Relative 01/06/2021 4  % Final   Eosinophils Absolute 01/06/2021 0.2  0.0 - 0.5 K/uL Final   Basophils Relative 01/06/2021 1  % Final   Basophils Absolute 01/06/2021 0.0  0.0 - 0.1 K/uL Final   Immature Granulocytes 01/06/2021 1  % Final   Abs Immature Granulocytes 01/06/2021 0.02  0.00 - 0.07 K/uL Final   Performed at [facility], [address] [place], [place] [address]    Assessment:  1. Iron Deficiency Anemia - hemoglobin 8.7. Normocytic. Iron studies pending at time of visit. Previously iron studies were normal. No iron today. Ferritin 113. Iron saturation 14%. TIBC normal. Consider giving iv iron at next visit.  2. Thrombocytopenia- platelet count 108. Decreased. Previously 130. Suspect related to plauqenil. Asymptomatic. Monitor.   3. Leukopenia. Wbc normal today. ALC normal. Monitor. If counts decline persistently consider bone marrow.    Plan:  No Venofer today. 3 mo- labs (cbc, ferritin, iron studies), MD to establish care, possible venofer  Patient lives in [place] and agrees to visits at either location.    I  discussed the assessment and treatment plan with the patient.  The patient was provided an opportunity to ask questions and all were answered.  The patient agreed with the plan and demonstrated an understanding of the instructions.  The patient was advised to call back if the symptoms worsen or if the condition fails to improve as anticipated.  Beckey Rutter, DNP, AGNP-C [place] at [facility] [phone] (clinic)

## 2021-01-06 NOTE — Progress Notes (Signed)
Pt in for follow up reports still very fatigued all the time.

## 2021-01-08 NOTE — Progress Notes (Signed)
FYI. I think you just saw him. Wanted to make sure he didn't need anything.  Faythe Casa, NP 01/08/2021 9:02 AM

## 2021-01-09 ENCOUNTER — Encounter: Payer: Self-pay | Admitting: Hematology and Oncology

## 2021-01-14 ENCOUNTER — Other Ambulatory Visit: Payer: Self-pay | Admitting: Cardiovascular Disease

## 2021-01-14 DIAGNOSIS — I4819 Other persistent atrial fibrillation: Secondary | ICD-10-CM

## 2021-01-14 NOTE — Telephone Encounter (Signed)
Please review for refill, Thanks !  

## 2021-01-14 NOTE — Telephone Encounter (Signed)
Prescription refill request for Eliquis received. Indication:AFIB Last office visit:GOLLAN 12/15/20 Scr:0.92 08/26/20 Age: 25M Weight:72.9KG

## 2021-01-19 ENCOUNTER — Ambulatory Visit: Payer: Medicare Other | Admitting: Dermatology

## 2021-02-06 ENCOUNTER — Ambulatory Visit (INDEPENDENT_AMBULATORY_CARE_PROVIDER_SITE_OTHER): Payer: Medicare Other

## 2021-02-06 VITALS — Ht 71.0 in | Wt 160.0 lb

## 2021-02-06 DIAGNOSIS — Z0000 Encounter for general adult medical examination without abnormal findings: Secondary | ICD-10-CM | POA: Diagnosis not present

## 2021-02-06 NOTE — Patient Instructions (Addendum)
[name] , Thank you for taking time to come for your Medicare Wellness Visit. I appreciate your ongoing commitment to your health goals. Please review the following plan we discussed and let me know if I can assist you in the future.   These are the goals we discussed:  Goals       Patient Stated     Increase physical activity (pt-stated)      Stay active walking for exercise.         This is a list of the screening recommended for you and due dates:  Health Maintenance  Topic Date Due   COVID-19 Vaccine (4 - Booster for Pfizer series) 02/22/2021*   Zoster (Shingles) Vaccine (1 of 2) 05/08/2021*   Colon Cancer Screening  09/24/2021   Tetanus Vaccine  12/14/2023   Flu Shot  Completed   HPV Vaccine  Aged Out  *Topic was postponed. The date shown is not the original due date.    Advanced directives: On file  Conditions/risks identified: none new.  Follow up in one year for your annual wellness visit.   Preventive Care 63 Years and Older, Male Preventive care refers to lifestyle choices and visits with your health care provider that can promote health and wellness. What does preventive care include? A yearly physical exam. This is also called an annual well check. Dental exams once or twice a year. Routine eye exams. Ask your health care provider how often you should have your eyes checked. Personal lifestyle choices, including: Daily care of your teeth and gums. Regular physical activity. Eating a healthy diet. Avoiding tobacco and drug use. Limiting alcohol use. Practicing safe sex. Taking low doses of aspirin every day. Taking vitamin and mineral supplements as recommended by your health care provider. What happens during an annual well check? The services and screenings done by your health care provider during your annual well check will depend on your age, overall health, lifestyle risk factors, and family history of disease. Counseling  Your health care  provider may ask you questions about your: Alcohol use. Tobacco use. Drug use. Emotional well-being. Home and relationship well-being. Sexual activity. Eating habits. History of falls. Memory and ability to understand (cognition). Work and work Statistician. Screening  You may have the following tests or measurements: Height, weight, and BMI. Blood pressure. Lipid and cholesterol levels. These may be checked every 5 years, or more frequently if you are over 81 years old. Skin check. Lung cancer screening. You may have this screening every year starting at age 82 if you have a 30-pack-year history of smoking and currently smoke or have quit within the past 15 years. Fecal occult blood test (FOBT) of the stool. You may have this test every year starting at age 82. Flexible sigmoidoscopy or colonoscopy. You may have a sigmoidoscopy every 5 years or a colonoscopy every 10 years starting at age 82. Prostate cancer screening. Recommendations will vary depending on your family history and other risks. Hepatitis C blood test. Hepatitis B blood test. Sexually transmitted disease (STD) testing. Diabetes screening. This is done by checking your blood sugar (glucose) after you have not eaten for a while (fasting). You may have this done every 1-3 years. Abdominal aortic aneurysm (AAA) screening. You may need this if you are a current or former smoker. Osteoporosis. You may be screened starting at age 44 if you are at high risk. Talk with your health care provider about your test results, treatment options, and if necessary, the  need for more tests. Vaccines  Your health care provider may recommend certain vaccines, such as: Influenza vaccine. This is recommended every year. Tetanus, diphtheria, and acellular pertussis (Tdap, Td) vaccine. You may need a Td booster every 10 years. Zoster vaccine. You may need this after age 82. Pneumococcal 13-valent conjugate (PCV13) vaccine. One dose is  recommended after age 82. Pneumococcal polysaccharide (PPSV23) vaccine. One dose is recommended after age 82. Talk to your health care provider about which screenings and vaccines you need and how often you need them. This information is not intended to replace advice given to you by your health care provider. Make sure you discuss any questions you have with your health care provider. Document Released: 06/06/2015 Document Revised: 01/28/2016 Document Reviewed: 03/11/2015 Elsevier Interactive Patient Education  2017 [place] Prevention in the Home Falls can cause injuries. They can happen to people of all ages. There are many things you can do to make your home safe and to help prevent falls. What can I do on the outside of my home? Regularly fix the edges of walkways and driveways and fix any cracks. Remove anything that might make you trip as you walk through a door, such as a raised step or threshold. Trim any bushes or trees on the path to your home. Use bright outdoor lighting. Clear any walking paths of anything that might make someone trip, such as rocks or tools. Regularly check to see if handrails are loose or broken. Make sure that both sides of any steps have handrails. Any raised decks and porches should have guardrails on the edges. Have any leaves, snow, or ice cleared regularly. Use sand or salt on walking paths during winter. Clean up any spills in your garage right away. This includes oil or grease spills. What can I do in the bathroom? Use night lights. Install grab bars by the toilet and in the tub and shower. Do not use towel bars as grab bars. Use non-skid mats or decals in the tub or shower. If you need to sit down in the shower, use a plastic, non-slip stool. Keep the floor dry. Clean up any water that spills on the floor as soon as it happens. Remove soap buildup in the tub or shower regularly. Attach bath mats securely with double-sided non-slip rug  tape. Do not have throw rugs and other things on the floor that can make you trip. What can I do in the bedroom? Use night lights. Make sure that you have a light by your bed that is easy to reach. Do not use any sheets or blankets that are too big for your bed. They should not hang down onto the floor. Have a firm chair that has side arms. You can use this for support while you get dressed. Do not have throw rugs and other things on the floor that can make you trip. What can I do in the kitchen? Clean up any spills right away. Avoid walking on wet floors. Keep items that you use a lot in easy-to-reach places. If you need to reach something above you, use a strong step stool that has a grab bar. Keep electrical cords out of the way. Do not use floor polish or wax that makes floors slippery. If you must use wax, use non-skid floor wax. Do not have throw rugs and other things on the floor that can make you trip. What can I do with my stairs? Do not leave any items on the  stairs. Make sure that there are handrails on both sides of the stairs and use them. Fix handrails that are broken or loose. Make sure that handrails are as long as the stairways. Check any carpeting to make sure that it is firmly attached to the stairs. Fix any carpet that is loose or worn. Avoid having throw rugs at the top or bottom of the stairs. If you do have throw rugs, attach them to the floor with carpet tape. Make sure that you have a light switch at the top of the stairs and the bottom of the stairs. If you do not have them, ask someone to add them for you. What else can I do to help prevent falls? Wear shoes that: Do not have high heels. Have rubber bottoms. Are comfortable and fit you well. Are closed at the toe. Do not wear sandals. If you use a stepladder: Make sure that it is fully opened. Do not climb a closed stepladder. Make sure that both sides of the stepladder are locked into place. Ask someone to  hold it for you, if possible. Clearly mark and make sure that you can see: Any grab bars or handrails. First and last steps. Where the edge of each step is. Use tools that help you move around (mobility aids) if they are needed. These include: Canes. Walkers. Scooters. Crutches. Turn on the lights when you go into a dark area. Replace any light bulbs as soon as they burn out. Set up your furniture so you have a clear path. Avoid moving your furniture around. If any of your floors are uneven, fix them. If there are any pets around you, be aware of where they are. Review your medicines with your doctor. Some medicines can make you feel dizzy. This can increase your chance of falling. Ask your doctor what other things that you can do to help prevent falls. This information is not intended to replace advice given to you by your health care provider. Make sure you discuss any questions you have with your health care provider. Document Released: 03/06/2009 Document Revised: 10/16/2015 Document Reviewed: 06/14/2014 Elsevier Interactive Patient Education  2017 [employer].

## 2021-02-06 NOTE — Progress Notes (Addendum)
Subjective:   Bryan [name] is a 80 y.o. male who presents for Medicare Annual/Subsequent preventive examination.  Review of Systems    No ROS.  Medicare Wellness Virtual Visit.  Visual/audio telehealth visit, UTA vital signs.   See social history for additional risk factors.   Cardiac Risk Factors include: advanced age (>48mn, >>67women);male gender;hypertension     Objective:    Today's Vitals   02/06/21 1152  Weight: 160 lb (72.6 kg)  Height: '5\' 11"'$  (1.803 m)   Body mass index is 22.32 kg/m.  Advanced Directives 02/06/2021 01/06/2021 09/30/2020 08/18/2020 01/15/2020 01/08/2020 12/19/2019  Does Patient Have a Medical Advance Directive? Yes Yes Yes Yes Yes Yes Yes  Type of AParamedicof AUnderwoodLiving will HSalt Creek CommonsLiving will HVeronaLiving will HR[place]oulLiving will HTokelandLiving will HEdgefieldLiving will HNaguaboLiving will  Does patient want to make changes to medical advance directive? No - Patient declined - No - Patient declined Yes (ED - Information included in AVS) No - Patient declined - No - Patient declined  Copy of HOspreyin Chart? Yes - validated most recent copy scanned in chart (See row information) - No - copy requested - Yes - validated most recent copy scanned in chart (See row information) - Yes - validated most recent copy scanned in chart (See row information)  Would patient like information on creating a medical advance directive? - - No - Patient declined Yes (ED - Information included in AVS) No - Patient declined No - Patient declined -    Current Medications (verified) Outpatient Encounter Medications as of 02/06/2021  Medication Sig   apixaban (ELIQUIS) 5 MG TABS tablet TAKE ONE TABLET BY MOUTH TWICE DAILY   amLODipine (NORVASC) 5 MG tablet Take 1 tablet (5 mg total) by mouth daily.    ARIPiprazole (ABILIFY) 5 MG tablet Take by mouth.   atorvastatin (LIPITOR) 40 MG tablet TAKE ONE TABLET BY MOUTH EVERY DAY   calcium carbonate (OS-CAL - DOSED IN MG OF ELEMENTAL CALCIUM) 1250 (500 Ca) MG tablet Take 1 tablet by mouth daily with lunch.   cetirizine (ZYRTEC) 10 MG tablet Take 10 mg by mouth daily.   diphenhydrAMINE (SOMINEX) 25 MG tablet Take by mouth.   donepezil (ARICEPT) 10 MG tablet Take by mouth.   fenofibrate 160 MG tablet TAKE ONE TABLET AT BEDTIME   FLUoxetine (PROZAC) 20 MG capsule TAKE 1 CAPSULE BY MOUTH ONCE DAILY   gabapentin (NEURONTIN) 300 MG capsule TAKE 1 CAPSULE BY MOUTH 3 TIMES DAILY.   hydrALAZINE (APRESOLINE) 25 MG tablet Take by mouth.   HYDROcodone-acetaminophen (NORCO/VICODIN) 5-325 MG tablet Take 1 tablet by mouth 4 (four) times daily as needed for moderate pain.   hydrocortisone cream 1 % Apply 1 application topically daily as needed for itching.   hydroxychloroquine (PLAQUENIL) 200 MG tablet Take 200 mg by mouth 3 (three) times a week. Tue, Thurs, and Sat   lisinopril (ZESTRIL) 20 MG tablet Take 1.5 tablets (30 mg total) by mouth every evening.   loperamide (IMODIUM) 2 MG capsule Take by mouth.   Multiple Vitamins-Minerals (PRESERVISION AREDS 2) CAPS Take 1 capsule by mouth 2 (two) times daily.   omeprazole (PRILOSEC) 40 MG capsule Take 1 capsule (40 mg total) by mouth daily.   traZODone (DESYREL) 50 MG tablet TAKE 1 OR 2 TABLETS BY MOUTH AT BEDTIME AS NEEDED.   vitamin B-12 (CYANOCOBALAMIN)  1000 MCG tablet Take 1,000 mcg by mouth daily with lunch.    Vitamin D, Ergocalciferol, (DRISDOL) 1.25 MG (50000 UNIT) CAPS capsule Take 50,000 Units by mouth once a week.   vitamin E 180 MG (400 UNITS) capsule Take 800 Units by mouth daily with lunch.   Facility-Administered Encounter Medications as of 02/06/2021  Medication   0.9 %  sodium chloride infusion    Allergies (verified) Penicillins, Demerol [meperidine], and Other   History: Past Medical  History:  Diagnosis Date   Abnormal CT scan    Asymmetric left rectal wall thickening    Anemia    Anxiety    Arrhythmia    Basal cell carcinoma 04/28/2020   L forehead - ED&C 06/17/2020   CHF (congestive heart failure) (HCC)    Chronic lower back pain    Complication of anesthesia    Memory loss 09/2015   Coronary artery disease    Depression    GERD (gastroesophageal reflux disease)    Glaucoma    H/O thoracic aortic aneurysm repair    Headache    Heart murmur    History of being hospitalized    memory lose kidney funtion down blood pressure up   History of blood transfusion    "think he had one when he had heart valve OR" (10/14/2016); "none since" (10/19/2017)   History of chicken pox    Hyperlipidemia    Hypertension    Lumbar stenosis    Murmur    Neuropathy    Osteoarthritis    worse in feet and ankles   Paroxysmal A-fib ([place])    Poor short term memory    takes Aricept   Rheumatoid arthritis ([place])    "all over" (10/14/2016)   Schizophrenia ([place])    Seasonal allergies    Thoracic spine pain    Valvular heart disease    Wears glasses    reading   Past Surgical History:  Procedure Laterality Date   AORTIC VALVE REPLACEMENT  2007   [facility].  [place], [place]; "pig valve"   APPENDECTOMY  2010   BACK SURGERY     CARDIAC VALVE REPLACEMENT     CATARACT EXTRACTION W/ INTRAOCULAR LENS  IMPLANT, BILATERAL Bilateral 2012   COLONOSCOPY WITH PROPOFOL N/A 09/24/2016   Procedure: COLONOSCOPY WITH PROPOFOL;  Surgeon: Jonathon Bellows, MD;  Location: [facility] ENDOSCOPY;  Service: Endoscopy;  Laterality: N/A;   ESOPHAGOGASTRODUODENOSCOPY (EGD) WITH PROPOFOL N/A 09/24/2016   Procedure: ESOPHAGOGASTRODUODENOSCOPY (EGD) WITH PROPOFOL;  Surgeon: Jonathon Bellows, MD;  Location[facility] ENDOSCOPY;  Service: Endoscopy;  Laterality: N/A;   GIVENS CAPSULE STUDY N/A 09/24/2016   Procedure: GIVENS CAPSULE STUDY;  Surgeon: Jonathon Bellows, MD;  Location[facility] ENDOSCOPY;  Service: Endoscopy;  Laterality: N/A;    HAMMER TOE SURGERY Right 10/09/2015   Procedure: HAMMER TOE REPAIR WITH K-WIRE FIXATION RIGHT SECOND TOE;  Surgeon: Albertine Patricia, DPM;  Location: [place];  Service: Podiatry;  Laterality: Right;  WITH LOCAL   HARDWARE REMOVAL N/A 11/02/2019   Procedure: Removal of hardware thoracic spine;  Surgeon: Kary Kos, MD;  Location: [place];  Service: Neurosurgery;  Laterality: N/A;  posterior   INGUINAL HERNIA REPAIR Right 05/05/2018   Medium Bard PerFix plug.  Surgeon: Robert Bellow, MD;  Location: [facility] ORS;  Service: General;  Laterality: Right;   IR VERTEBROPLASTY CERV/THOR BX INC UNI/BIL INC/INJECT/IMAGING  10/27/2018   JOINT REPLACEMENT Left    left total knee   LAMINECTOMY WITH POSTERIOR LATERAL ARTHRODESIS LEVEL 3 N/A  09/28/2017   Procedure: LUMBAR  POSTEROR  FUSION REVISION - LUMBAR ONE -TWO, LUMBAR TWO -THREE,THREE-FOUR ,BILATERAL ARTHRODESIS REMOVAL LUMBAR THREE HARDWARE;  Surgeon: Kary Kos, MD;  Location: [place];  Service: Neurosurgery;  Laterality: N/A;   LAMINECTOMY WITH POSTERIOR LATERAL ARTHRODESIS LEVEL 3 N/A 10/20/2017   Procedure: Revision fusion and removal of hardware Lumbar one, Pedicle screw fixation thoracic ten-lumbar two, thoracic nine-ten laminotomy, Posterior Lumbar Arthrodesis thoracic ten-lumbar two ;  Surgeon: Kary Kos, MD;  Location: [place];  Service: Neurosurgery;  Laterality: N/A;   LAPAROSCOPIC CHOLECYSTECTOMY  2010   LUMBAR FUSION Right 06/08/2017   LUMBAR THREE-FOUR, LUMBAR FOUR-FIVE POSTEROLATERAL ARTHRODESIS WITH RIGHT LUMBAR FOUR-FIVE LAMINECTOMY/FORAMINOTOMY   LUMBAR LAMINECTOMY/DECOMPRESSION MICRODISCECTOMY Right 03/08/2016   Procedure: Laminectomy and Foraminotomy - Lumbar Five-Sacral One Right;  Surgeon: Kary Kos, MD;  Location: [place];  Service: Neurosurgery;  Laterality: Right;  Right   MULTIPLE TOOTH EXTRACTIONS     "3"   POSTERIOR LUMBAR FUSION  10/14/2016   L5-S1   REPLACEMENT TOTAL KNEE Left 2003   THORACIC AORTIC ANEURYSM REPAIR  2010    Family History  Problem Relation Age of Onset   Heart failure Mother    Hypertension Mother    Asthma Mother    Osteoporosis Mother    Heart attack Father 17       MI   Hypertension Sister    Prostate cancer Neg Hx    Bladder Cancer Neg Hx    Kidney cancer Neg Hx    Colon cancer Neg Hx    Social History   Socioeconomic History   Marital status: Widowed    Spouse name: Not on file   Number of children: Not on file   Years of education: Not on file   Highest education level: Not on file  Occupational History   Occupation: retired  Tobacco Use   Smoking status: Former    Packs/day: 1.00    Years: 32.00    Pack years: 32.00    Types: Cigarettes    Quit date: 07/05/1981    Years since quitting: 39.6   Smokeless tobacco: Never  Vaping Use   Vaping Use: Never used  Substance and Sexual Activity   Alcohol use: Yes    Comment: "glass of wine/month; if that"   Drug use: Never   Sexual activity: Not Currently  Other Topics Concern   Not on file  Social History Narrative   Not on file   Social Determinants of Health   Financial Resource Strain: Low Risk    Difficulty of Paying Living Expenses: Not hard at all  Food Insecurity: No Food Insecurity   Worried About Charity fundraiser in the Last Year: Never true   [place] in the Last Year: Never true  Transportation Needs: No Transportation Needs   Lack of Transportation (Medical): No   Lack of Transportation (Non-Medical): No  Physical Activity: Insufficiently Active   Days of Exercise per Week: 2 days   Minutes of Exercise per Session: 30 min  Stress: No Stress Concern Present   Feeling of Stress : Not at all  Social Connections: Unknown   Frequency of Communication with Friends and Family: More than three times a week   Frequency of Social Gatherings with Friends and Family: More than three times a week   Attends Religious Services: More than 4 times per year   Active Member of [employer] or Organizations:  Yes   Attends Archivist Meetings: More than  4 times per year   Marital Status: Not on file    Tobacco Counseling Counseling given: Not Answered   Clinical Intake:  Pre-visit preparation completed: Yes        Diabetes: No  How often do you need to have someone help you when you read instructions, pamphlets, or other written materials from your doctor or pharmacy?: 1 - Never  Interpreter Needed?: No      Activities of Daily Living In your present state of health, do you have any difficulty performing the following activities: 02/06/2021  Hearing? N  Vision? N  Difficulty concentrating or making decisions? (No Data)  Comment Dx of cognitive decline  Walking or climbing stairs? Y  Comment Cane in use when ambulating.  Dressing or bathing? N  Preparing Food and eating ? N  Using the Toilet? N  In the past six months, have you accidently leaked urine? N  Do you have problems with loss of bowel control? N  Managing your Medications? N  Managing your Finances? N  Housekeeping or managing your Housekeeping? N  Some recent data might be hidden    Patient Care Team: [name], MD as PCP - General (Family Medicine) [name] Kathlene November, MD as PCP - Cardiology (Cardiology) Minna Merritts, MD as Consulting Physician (Cardiology) Emmaline Kluver., MD (Rheumatology) Kary Kos, MD as Consulting Physician (Neurosurgery) Jonathon Bellows, MD as Consulting Physician (Gastroenterology)  Indicate any recent Medical Services you may have received from other than [facility] providers in the past year (date may be approximate).     Assessment:   This is a routine wellness examination for Aven.  I connected with Alian today by telephone and verified that I am speaking with the correct person using two identifiers. Location patient: home Location provider: work Persons participating in the virtual visit: patient, Marine scientist.    I discussed the limitations,  risks, security and privacy concerns of performing an evaluation and management service by telephone and the availability of in person appointments. The patient expressed understanding and verbally consented to this telephonic visit.    Interactive audio and video telecommunications were attempted between this provider and patient, however failed, due to patient having technical difficulties OR patient did not have access to video capability.  We continued and completed visit with audio only.  Some vital signs may be absent or patient reported.   Hearing/Vision screen Hearing Screening - Comments:: Patient is able to hear conversational tones without difficulty.  No issues reported. Vision Screening - Comments:: Followed by [facility] Semi-annual visits  Wears corrective lenses when reading  Dietary issues and exercise activities discussed:  Healthy diet Good water intake Current Exercise Habits: Home exercise routine, Type of exercise: walking, Intensity: Mild   Goals Addressed               This Visit's Progress     Patient Stated     Increase physical activity (pt-stated)        Stay active walking for exercise.        Depression Screen PHQ 2/9 Scores 02/06/2021 02/22/2020 12/19/2019 01/24/2019 12/11/2018 07/06/2016 01/19/2016  PHQ - 2 Score 0 0 0 0 0 0 0  PHQ- 9 Score - - - 0 - - -  Exception Documentation - - - - - - -    Fall Risk Fall Risk  02/06/2021 08/22/2020 05/26/2020 01/02/2020 12/19/2019  Falls in the past year? - 0 0 - 0  Comment - - - - -  Number falls in past yr: - 0 0 - 0  Injury with Fall? - 0 0 - -  Risk for fall due to : Impaired balance/gait;History of fall(s) History of fall(s);Impaired mobility - - -  Follow up Falls evaluation completed Falls evaluation completed Falls evaluation completed Falls evaluation completed Falls evaluation completed    [place]: Adequate lighting in your home to reduce risk of falls? Yes    ASSISTIVE DEVICES UTILIZED TO PREVENT FALLS: Use of a cane, walker or w/c? Yes   TIMED UP AND GO: Was the test performed? No .   Cognitive Function: Patient is alert and oriented x3.  Manages his own medications and finances.   MMSE - Mini Mental State Exam 07/06/2016 06/27/2015  Orientation to time 5 5  Orientation to Place 5 5  Registration 3 3  Attention/ Calculation 5 5  Recall 3 3  Language- name 2 objects 2 2  Language- repeat 1 1  Language- follow 3 step command 3 3  Language- read & follow direction 1 1  Write a sentence 1 1  Copy design 1 1  Total score 30 30     6CIT Screen 12/11/2018  What Year? 0 points  What month? 0 points  What time? 0 points  Count back from 20 0 points  Months in reverse 0 points  Repeat phrase 0 points  Total Score 0   Immunizations Immunization History  Administered Date(s) Administered   Hepatitis A 06/24/2017   Hepatitis A, Adult 06/24/2017   Influenza Split 04/09/2014   Influenza, High Dose Seasonal PF 05/09/2015, 02/23/2016, 04/28/2017, 03/18/2018   Influenza,inj,Quad PF,6+ Mos 02/21/2019   Influenza-Unspecified 02/15/2020, 01/29/2021   PFIZER(Purple Top)SARS-COV-2 Vaccination 06/07/2019, 06/28/2019, 12/23/2019   PPD Test 01/10/2017   Pneumococcal Conjugate-13 12/13/2013   Pneumococcal Polysaccharide-23 05/09/2015   Tdap 12/13/2013   Health Maintenance Health Maintenance  Topic Date Due   COVID-19 Vaccine (4 - Booster for [place] series) 02/22/2021 (Originally 03/16/2020)   Zoster Vaccines- Shingrix (1 of 2) 05/08/2021 (Originally 06/25/1959)   COLONOSCOPY (Pts 45-14yr Insurance coverage will need to be confirmed)  09/24/2021   TETANUS/TDAP  12/14/2023   INFLUENZA VACCINE  Completed   HPV VACCINES  Aged Out   Shingrix vaccine- agrees to update immunization record. [place]   Hepatitis C Screening: does not qualify  Vision Screening: Recommended annual ophthalmology exams for early  detection of glaucoma and other disorders of the eye.  Dental Screening: Recommended annual dental exams for proper oral hygiene  Community Resource Referral / Chronic Care Management: CRR required this visit?  No   CCM required this visit?  No      Plan:   Keep all routine maintenance appointments.   I have personally reviewed and noted the following in the patient's chart:   Medical and social history Use of alcohol, tobacco or illicit drugs  Current medications and supplements including opioid prescriptions. Patient is currently taking opioid prescriptions. Information provided to patient regarding non-opioid alternatives. Patient advised to discuss non-opioid treatment plan with their provider. [facility]  Functional ability and status Nutritional status Physical activity Advanced directives List of other physicians Hospitalizations, surgeries, and ER visits in previous 12 months Vitals Screenings to include cognitive, depression, and falls Referrals and appointments  In addition, I have reviewed and discussed with patient certain preventive protocols, quality metrics, and best practice recommendations. A written personalized care plan for preventive services  as well as general preventive health recommendations were provided to patient via mychart.     OBrien-Blaney, Torie Priebe L, LPN   [id number]    I have reviewed the above information and agree with above.   Deborra Medina, MD

## 2021-02-16 ENCOUNTER — Other Ambulatory Visit: Payer: Self-pay | Admitting: Family Medicine

## 2021-02-16 DIAGNOSIS — F419 Anxiety disorder, unspecified: Secondary | ICD-10-CM

## 2021-02-16 DIAGNOSIS — F4381 Prolonged grief disorder: Secondary | ICD-10-CM

## 2021-02-16 DIAGNOSIS — F4329 Adjustment disorder with other symptoms: Secondary | ICD-10-CM

## 2021-02-25 ENCOUNTER — Other Ambulatory Visit: Payer: Self-pay | Admitting: Family Medicine

## 2021-03-02 ENCOUNTER — Other Ambulatory Visit: Payer: Self-pay | Admitting: Family Medicine

## 2021-03-23 ENCOUNTER — Other Ambulatory Visit: Payer: Self-pay | Admitting: Family

## 2021-03-23 DIAGNOSIS — I10 Essential (primary) hypertension: Secondary | ICD-10-CM

## 2021-03-30 ENCOUNTER — Other Ambulatory Visit: Payer: Self-pay | Admitting: Cardiovascular Disease

## 2021-03-31 ENCOUNTER — Inpatient Hospital Stay: Payer: Medicare Other | Attending: Oncology

## 2021-03-31 ENCOUNTER — Other Ambulatory Visit: Payer: Self-pay

## 2021-03-31 DIAGNOSIS — D696 Thrombocytopenia, unspecified: Secondary | ICD-10-CM

## 2021-03-31 DIAGNOSIS — R5383 Other fatigue: Secondary | ICD-10-CM | POA: Insufficient documentation

## 2021-03-31 DIAGNOSIS — G629 Polyneuropathy, unspecified: Secondary | ICD-10-CM | POA: Insufficient documentation

## 2021-03-31 DIAGNOSIS — E559 Vitamin D deficiency, unspecified: Secondary | ICD-10-CM | POA: Diagnosis not present

## 2021-03-31 DIAGNOSIS — D509 Iron deficiency anemia, unspecified: Secondary | ICD-10-CM | POA: Diagnosis present

## 2021-03-31 DIAGNOSIS — Z8249 Family history of ischemic heart disease and other diseases of the circulatory system: Secondary | ICD-10-CM | POA: Insufficient documentation

## 2021-03-31 DIAGNOSIS — E785 Hyperlipidemia, unspecified: Secondary | ICD-10-CM | POA: Diagnosis not present

## 2021-03-31 DIAGNOSIS — D61818 Other pancytopenia: Secondary | ICD-10-CM | POA: Insufficient documentation

## 2021-03-31 DIAGNOSIS — Z87891 Personal history of nicotine dependence: Secondary | ICD-10-CM | POA: Diagnosis not present

## 2021-03-31 DIAGNOSIS — Z836 Family history of other diseases of the respiratory system: Secondary | ICD-10-CM | POA: Insufficient documentation

## 2021-03-31 DIAGNOSIS — F419 Anxiety disorder, unspecified: Secondary | ICD-10-CM | POA: Insufficient documentation

## 2021-03-31 DIAGNOSIS — Z8269 Family history of other diseases of the musculoskeletal system and connective tissue: Secondary | ICD-10-CM | POA: Insufficient documentation

## 2021-03-31 DIAGNOSIS — Z79899 Other long term (current) drug therapy: Secondary | ICD-10-CM | POA: Insufficient documentation

## 2021-03-31 DIAGNOSIS — M2550 Pain in unspecified joint: Secondary | ICD-10-CM | POA: Insufficient documentation

## 2021-03-31 DIAGNOSIS — M069 Rheumatoid arthritis, unspecified: Secondary | ICD-10-CM | POA: Diagnosis present

## 2021-03-31 DIAGNOSIS — I10 Essential (primary) hypertension: Secondary | ICD-10-CM | POA: Diagnosis not present

## 2021-03-31 LAB — CBC WITH DIFFERENTIAL/PLATELET
Abs Immature Granulocytes: 0.02 10*3/uL (ref 0.00–0.07)
Basophils Absolute: 0 10*3/uL (ref 0.0–0.1)
Basophils Relative: 0 %
Eosinophils Absolute: 0 10*3/uL (ref 0.0–0.5)
Eosinophils Relative: 1 %
HCT: 33.1 % — ABNORMAL LOW (ref 39.0–52.0)
Hemoglobin: 10.8 g/dL — ABNORMAL LOW (ref 13.0–17.0)
Immature Granulocytes: 0 %
Lymphocytes Relative: 19 %
Lymphs Abs: 1.1 10*3/uL (ref 0.7–4.0)
MCH: 30.9 pg (ref 26.0–34.0)
MCHC: 32.6 g/dL (ref 30.0–36.0)
MCV: 94.8 fL (ref 80.0–100.0)
Monocytes Absolute: 0.4 10*3/uL (ref 0.1–1.0)
Monocytes Relative: 7 %
Neutro Abs: 4.1 10*3/uL (ref 1.7–7.7)
Neutrophils Relative %: 73 %
Platelets: 103 10*3/uL — ABNORMAL LOW (ref 150–400)
RBC: 3.49 MIL/uL — ABNORMAL LOW (ref 4.22–5.81)
RDW: 14.6 % (ref 11.5–15.5)
WBC: 5.6 10*3/uL (ref 4.0–10.5)
nRBC: 0 % (ref 0.0–0.2)

## 2021-03-31 LAB — IRON AND TIBC
Iron: 46 ug/dL (ref 45–182)
Saturation Ratios: 10 % — ABNORMAL LOW (ref 17.9–39.5)
TIBC: 452 ug/dL — ABNORMAL HIGH (ref 250–450)
UIBC: 406 ug/dL

## 2021-03-31 LAB — FERRITIN: Ferritin: 51 ng/mL (ref 24–336)

## 2021-04-06 ENCOUNTER — Other Ambulatory Visit: Payer: Self-pay

## 2021-04-06 DIAGNOSIS — D696 Thrombocytopenia, unspecified: Secondary | ICD-10-CM

## 2021-04-07 ENCOUNTER — Other Ambulatory Visit: Payer: Self-pay

## 2021-04-07 ENCOUNTER — Inpatient Hospital Stay (HOSPITAL_BASED_OUTPATIENT_CLINIC_OR_DEPARTMENT_OTHER): Payer: Medicare Other | Admitting: Oncology

## 2021-04-07 ENCOUNTER — Encounter: Payer: Self-pay | Admitting: Oncology

## 2021-04-07 ENCOUNTER — Inpatient Hospital Stay: Payer: Medicare Other

## 2021-04-07 VITALS — BP 170/69 | HR 51

## 2021-04-07 VITALS — BP 155/78 | HR 71 | Temp 97.6°F | Resp 18 | Wt 166.6 lb

## 2021-04-07 DIAGNOSIS — D696 Thrombocytopenia, unspecified: Secondary | ICD-10-CM | POA: Diagnosis not present

## 2021-04-07 DIAGNOSIS — E559 Vitamin D deficiency, unspecified: Secondary | ICD-10-CM

## 2021-04-07 DIAGNOSIS — R6889 Other general symptoms and signs: Secondary | ICD-10-CM | POA: Diagnosis not present

## 2021-04-07 DIAGNOSIS — D649 Anemia, unspecified: Secondary | ICD-10-CM

## 2021-04-07 DIAGNOSIS — D61818 Other pancytopenia: Secondary | ICD-10-CM | POA: Diagnosis not present

## 2021-04-07 DIAGNOSIS — D509 Iron deficiency anemia, unspecified: Secondary | ICD-10-CM

## 2021-04-07 MED ORDER — IRON SUCROSE 20 MG/ML IV SOLN
200.0000 mg | Freq: Once | INTRAVENOUS | Status: AC
  Administered 2021-04-07: 200 mg via INTRAVENOUS
  Filled 2021-04-07: qty 10

## 2021-04-07 MED ORDER — SODIUM CHLORIDE 0.9 % IV SOLN: 200.0000 mg | INTRAVENOUS | Status: DC

## 2021-04-07 MED ORDER — SODIUM CHLORIDE 0.9 % IV SOLN
Freq: Once | INTRAVENOUS | Status: AC
  Filled 2021-04-07: qty 250

## 2021-04-07 NOTE — Progress Notes (Signed)
Hematology/Oncology Consult note [facility]  Telephone:([phone] [phone]  Patient Care Team: [name], MD as PCP - General (Family Medicine) [name] Kathlene November, MD as PCP - Cardiology (Cardiology) Minna Merritts, MD as Consulting Physician (Cardiology) Emmaline Kluver., MD (Rheumatology) Kary Kos, MD as Consulting Physician (Neurosurgery) Jonathon Bellows, MD as Consulting Physician (Gastroenterology)   Name of the patient: Bryan [name]  [id number]  June 19, 1940   Date of visit: 04/07/21  Diagnosis-mild pancytopenia possibly secondary to rheumatoid arthritis Iron deficiency anemia  Chief complaint/ Reason for visit-routine follow-up of pancytopenia and iron deficiency anemia  Heme/Onc history: Patient is a 80 year old male with a past medical history of rheumatoid arthritis for which she has been on Plaquenil in.-Plaquenil was discontinued in 2018.Work-up on 07/19/2016 revealed a hematocrit of 31.7, hemoglobin 10.6, MCV 91, platelets 143,000, white count 3000 with an ANC of 1800.  Absolute lymphocyte count was 700 (low). Creatinine was 1.57.  LDH was 269 (98-192).  Normal labs included:  uric acid, folate, [place], SPEP, and copper.  Iron studies included a saturation of 11% (low) and a TIBC of 454 (high) c/w iron deficiency anemia.  Sed rate was 19.  Retic was 0.9% (low).  He is on oral iron with vitamin C.   Chest, abdomen, and pelvic CT on 08/09/2016 revealed no acute findings or clear evidence of malignancy in the chest, abdomen or pelvis.  There was asymmetric left rectal wall thickening possibly secondary to volume averaging with the prostate gland.   Bone marrow aspirate and biopsy on 11/04/2016 revealed a normocellular marrow (20%) with trilineage hematopoiesis and maturation.  There was mild megakaryocytic atypia.  Storage iron was present.  There were rare ringed sideroblasts.  Cytogenetics and FISH were normal (46,  XY).  He has a history of iron deficiency anemia and has required IV iron intermittently in the past   Interval history-patient reports baseline fatigue.  Denies any specific complaints other than that.  He has neuropathy in his feet which is stable.  Denies any recurrent infections or hospitalizations.  ECOG PS- 2 Pain scale- 3   Review of systems- Review of Systems  Constitutional:  Positive for malaise/fatigue. Negative for chills, fever and weight loss.  HENT:  Negative for congestion, ear discharge and nosebleeds.   Eyes:  Negative for blurred vision.  Respiratory:  Negative for cough, hemoptysis, sputum production, shortness of breath and wheezing.   Cardiovascular:  Negative for chest pain, palpitations, orthopnea and claudication.  Gastrointestinal:  Negative for abdominal pain, blood in stool, constipation, diarrhea, heartburn, melena, nausea and vomiting.  Genitourinary:  Negative for dysuria, flank pain, frequency, hematuria and urgency.  Musculoskeletal:  Positive for joint pain. Negative for back pain and myalgias.  Skin:  Negative for rash.  Neurological:  Negative for dizziness, tingling, focal weakness, seizures, weakness and headaches.  Endo/Heme/Allergies:  Does not bruise/bleed easily.  Psychiatric/Behavioral:  Negative for depression and suicidal ideas. The patient does not have insomnia.       Allergies  Allergen Reactions   Penicillins Hives, Swelling and Other (See Comments)    SWELLING REACTION UNSPECIFIED PATIENT HAS TAKEN AMOXICILLIN ON MED HX FROM [facility] PATIENT HAS HAD A PCN REACTION WITH IMMEDIATE RASH, FACIAL/TONGUE/THROAT SWELLING, SOB, OR LIGHTHEADEDNESS WITH HYPOTENSION:  #  #  #  YES  #  #  #   Has patient had a PCN reaction causing severe rash involving mucus membranes or skin necrosis: No Has patient had a PCN  reaction that required hospitalization No Has patient had a PCN reaction occurring within the last 10 years: No   Demerol [Meperidine] Hives  and Nausea And Vomiting   Other      Past Medical History:  Diagnosis Date   Abnormal CT scan    Asymmetric left rectal wall thickening    Anemia    Anxiety    Arrhythmia    Basal cell carcinoma 04/28/2020   L forehead - ED&C 06/17/2020   CHF (congestive heart failure) (HCC)    Chronic lower back pain    Complication of anesthesia    Memory loss 09/2015   Coronary artery disease    Depression    GERD (gastroesophageal reflux disease)    Glaucoma    H/O thoracic aortic aneurysm repair    Headache    Heart murmur    History of being hospitalized    memory lose kidney funtion down blood pressure up   History of blood transfusion    "think he had one when he had heart valve OR" (10/14/2016); "none since" (10/19/2017)   History of chicken pox    Hyperlipidemia    Hypertension    Lumbar stenosis    Murmur    Neuropathy    Osteoarthritis    worse in feet and ankles   Paroxysmal A-fib ([place])    Poor short term memory    takes Aricept   Rheumatoid arthritis ([place])    "all over" (10/14/2016)   Schizophrenia ([place])    Seasonal allergies    Thoracic spine pain    Valvular heart disease    Wears glasses    reading     Past Surgical History:  Procedure Laterality Date   AORTIC VALVE REPLACEMENT  2007   [facility].  Supply, [place]; "pig valve"   APPENDECTOMY  2010   BACK SURGERY     CARDIAC VALVE REPLACEMENT     CATARACT EXTRACTION W/ INTRAOCULAR LENS  IMPLANT, BILATERAL Bilateral 2012   COLONOSCOPY WITH PROPOFOL N/A 09/24/2016   Procedure: COLONOSCOPY WITH PROPOFOL;  Surgeon: Jonathon Bellows, MD;  Location: [facility] ENDOSCOPY;  Service: Endoscopy;  Laterality: N/A;   ESOPHAGOGASTRODUODENOSCOPY (EGD) WITH PROPOFOL N/A 09/24/2016   Procedure: ESOPHAGOGASTRODUODENOSCOPY (EGD) WITH PROPOFOL;  Surgeon: Jonathon Bellows, MD;  Location: [facility] ENDOSCOPY;  Service: Endoscopy;  Laterality: N/A;   GIVENS CAPSULE STUDY N/A 09/24/2016   Procedure: GIVENS CAPSULE STUDY;  Surgeon: Jonathon Bellows, MD;   Location: [facility] ENDOSCOPY;  Service: Endoscopy;  Laterality: N/A;   HAMMER TOE SURGERY Right 10/09/2015   Procedure: HAMMER TOE REPAIR WITH K-WIRE FIXATION RIGHT SECOND TOE;  Surgeon: Albertine Patricia, DPM;  Location: [place];  Service: Podiatry;  Laterality: Right;  WITH LOCAL   HARDWARE REMOVAL N/A 11/02/2019   Procedure: Removal of hardware thoracic spine;  Surgeon: Kary Kos, MD;  Location: [place];  Service: Neurosurgery;  Laterality: N/A;  posterior   INGUINAL HERNIA REPAIR Right 05/05/2018   Medium Bard PerFix plug.  Surgeon: Robert Bellow, MD;  Location: [facility] ORS;  Service: General;  Laterality: Right;   IR VERTEBROPLASTY CERV/THOR BX INC UNI/BIL INC/INJECT/IMAGING  10/27/2018   JOINT REPLACEMENT Left    left total knee   LAMINECTOMY WITH POSTERIOR LATERAL ARTHRODESIS LEVEL 3 N/A 09/28/2017   Procedure: LUMBAR  POSTEROR  FUSION REVISION - LUMBAR ONE -TWO, LUMBAR TWO -THREE,THREE-FOUR ,BILATERAL ARTHRODESIS REMOVAL LUMBAR THREE HARDWARE;  Surgeon: Kary Kos, MD;  Location: [place];  Service: Neurosurgery;  Laterality: N/A;   LAMINECTOMY WITH POSTERIOR  LATERAL ARTHRODESIS LEVEL 3 N/A 10/20/2017   Procedure: Revision fusion and removal of hardware Lumbar one, Pedicle screw fixation thoracic ten-lumbar two, thoracic nine-ten laminotomy, Posterior Lumbar Arthrodesis thoracic ten-lumbar two ;  Surgeon: Kary Kos, MD;  Location: [place];  Service: Neurosurgery;  Laterality: N/A;   LAPAROSCOPIC CHOLECYSTECTOMY  2010   LUMBAR FUSION Right 06/08/2017   LUMBAR THREE-FOUR, LUMBAR FOUR-FIVE POSTEROLATERAL ARTHRODESIS WITH RIGHT LUMBAR FOUR-FIVE LAMINECTOMY/FORAMINOTOMY   LUMBAR LAMINECTOMY/DECOMPRESSION MICRODISCECTOMY Right 03/08/2016   Procedure: Laminectomy and Foraminotomy - Lumbar Five-Sacral One Right;  Surgeon: Kary Kos, MD;  Location: [place];  Service: Neurosurgery;  Laterality: Right;  Right   MULTIPLE TOOTH EXTRACTIONS     "3"   POSTERIOR LUMBAR FUSION  10/14/2016   L5-S1    REPLACEMENT TOTAL KNEE Left 2003   THORACIC AORTIC ANEURYSM REPAIR  2010    Social History   Socioeconomic History   Marital status: Widowed    Spouse name: Not on file   Number of children: Not on file   Years of education: Not on file   Highest education level: Not on file  Occupational History   Occupation: retired  Tobacco Use   Smoking status: Former    Packs/day: 1.00    Years: 32.00    Pack years: 32.00    Types: Cigarettes    Quit date: 07/05/1981    Years since quitting: 39.7   Smokeless tobacco: Never  Vaping Use   Vaping Use: Never used  Substance and Sexual Activity   Alcohol use: Yes    Comment: "glass of wine/month; if that"   Drug use: Never   Sexual activity: Not Currently  Other Topics Concern   Not on file  Social History Narrative   Not on file   Social Determinants of Health   Financial Resource Strain: Low Risk    Difficulty of Paying Living Expenses: Not hard at all  Food Insecurity: No Food Insecurity   Worried About Charity fundraiser in the Last Year: Never true   [place] in the Last Year: Never true  Transportation Needs: No Transportation Needs   Lack of Transportation (Medical): No   Lack of Transportation (Non-Medical): No  Physical Activity: Insufficiently Active   Days of Exercise per Week: 2 days   Minutes of Exercise per Session: 30 min  Stress: No Stress Concern Present   Feeling of Stress : Not at all  Social Connections: Unknown   Frequency of Communication with Friends and Family: More than three times a week   Frequency of Social Gatherings with Friends and Family: More than three times a week   Attends Religious Services: More than 4 times per year   Active Member of [employer] or Organizations: Yes   Attends Music therapist: More than 4 times per year   Marital Status: Not on file  Intimate Partner Violence: Not At Risk   Fear of Current or Ex-Partner: No   Emotionally Abused: No   Physically Abused:  No   Sexually Abused: No    Family History  Problem Relation Age of Onset   Heart failure Mother    Hypertension Mother    Asthma Mother    Osteoporosis Mother    Heart attack Father 43       MI   Hypertension Sister    Prostate cancer Neg Hx    Bladder Cancer Neg Hx    Kidney cancer Neg Hx    Colon cancer Neg Hx  Current Outpatient Medications:    amLODipine (NORVASC) 5 MG tablet, TAKE 1 TABLET BY MOUTH DAILY., Disp: 90 tablet, Rfl: 2   apixaban (ELIQUIS) 5 MG TABS tablet, TAKE ONE TABLET BY MOUTH TWICE DAILY, Disp: 180 tablet, Rfl: 1   ARIPiprazole (ABILIFY) 5 MG tablet, Take by mouth., Disp: , Rfl:    atorvastatin (LIPITOR) 40 MG tablet, TAKE ONE TABLET BY MOUTH EVERY DAY, Disp: 90 tablet, Rfl: 3   calcium carbonate (OS-CAL - DOSED IN MG OF ELEMENTAL CALCIUM) 1250 (500 Ca) MG tablet, Take 1 tablet by mouth daily with lunch., Disp: , Rfl:    cetirizine (ZYRTEC) 10 MG tablet, Take 10 mg by mouth daily., Disp: , Rfl:    diphenhydrAMINE (SOMINEX) 25 MG tablet, Take by mouth., Disp: , Rfl:    donepezil (ARICEPT) 10 MG tablet, Take by mouth., Disp: , Rfl:    fenofibrate 160 MG tablet, TAKE ONE TABLET AT BEDTIME, Disp: 90 tablet, Rfl: 3   FLUoxetine (PROZAC) 20 MG capsule, TAKE 1 CAPSULE BY MOUTH ONCE DAILY, Disp: 90 capsule, Rfl: 1   gabapentin (NEURONTIN) 300 MG capsule, TAKE 1 CAPSULE BY MOUTH 3 TIMES DAILY., Disp: 270 capsule, Rfl: 1   hydrALAZINE (APRESOLINE) 25 MG tablet, Take by mouth. (Patient not taking: Reported on 04/07/2021), Disp: , Rfl:    HYDROcodone-acetaminophen (NORCO/VICODIN) 5-325 MG tablet, Take 1 tablet by mouth 4 (four) times daily as needed for moderate pain., Disp: , Rfl:    hydrocortisone cream 1 %, Apply 1 application topically daily as needed for itching., Disp: , Rfl:    hydroxychloroquine (PLAQUENIL) 200 MG tablet, Take 200 mg by mouth 3 (three) times a week. Tue, Thurs, and Sat, Disp: , Rfl:    lisinopril (ZESTRIL) 20 MG tablet, daily., Disp: , Rfl:     loperamide (IMODIUM) 2 MG capsule, Take by mouth., Disp: , Rfl:    omeprazole (PRILOSEC) 40 MG capsule, Take 1 capsule (40 mg total) by mouth daily., Disp: 90 capsule, Rfl: 3   testosterone cypionate (DEPOTESTOSTERONE CYPIONATE) 200 MG/ML injection, Inject into the muscle., Disp: , Rfl:    traZODone (DESYREL) 50 MG tablet, TAKE 1 OR 2 TABLETS BY MOUTH AT BEDTIME AS NEEDED., Disp: 90 tablet, Rfl: 1   vitamin B-12 (CYANOCOBALAMIN) 1000 MCG tablet, Take 1,000 mcg by mouth daily with lunch. , Disp: , Rfl:    Vitamin D, Ergocalciferol, (DRISDOL) 1.25 MG (50000 UNIT) CAPS capsule, Take 50,000 Units by mouth once a week., Disp: , Rfl:    vitamin E 180 MG (400 UNITS) capsule, Take 800 Units by mouth daily with lunch., Disp: , Rfl:    Multiple Vitamins-Minerals (PRESERVISION AREDS 2) CAPS, Take 1 capsule by mouth 2 (two) times daily. (Patient not taking: Reported on 04/07/2021), Disp: , Rfl:  No current facility-administered medications for this visit.  Facility-Administered Medications Ordered in Other Visits:    0.9 %  sodium chloride infusion, , Intravenous, Continuous, Corcoran, Melissa C, MD, Last Rate: 10 mL/hr at 12/11/19 1123, New Bag at 12/11/19 1123  Physical exam:  Vitals:   04/07/21 1316  BP: (!) 155/78  Pulse: 71  Resp: 18  Temp: 97.6 F (36.4 C)  SpO2: 98%  Weight: 166 lb 9.6 oz (75.6 kg)   Physical Exam Constitutional:      Comments: Sitting in a wheelchair.  Appears in no acute distress  Cardiovascular:     Rate and Rhythm: Normal rate and regular rhythm.     Heart sounds: Normal heart sounds.  Pulmonary:  Effort: Pulmonary effort is normal.     Breath sounds: Normal breath sounds.  Abdominal:     General: Bowel sounds are normal.     Palpations: Abdomen is soft.  Musculoskeletal:     Cervical back: Normal range of motion.  Skin:    General: Skin is warm and dry.  Neurological:     Mental Status: He is alert and oriented to person, place, and time.     CMP  Latest Ref Rng & Units 08/26/2020  Glucose 65 - 99 mg/dL 99  BUN 8 - 27 mg/dL 20  Creatinine 0.76 - 1.27 mg/dL 0.92  Sodium 134 - 144 mmol/L 137  Potassium 3.5 - 5.2 mmol/L 5.0  Chloride 96 - 106 mmol/L 102  CO2 20 - 29 mmol/L 21  Calcium 8.6 - 10.2 mg/dL 9.2  Total Protein 6.0 - 8.5 g/dL 6.6  Total Bilirubin 0.0 - 1.2 mg/dL 0.2  Alkaline Phos 44 - 121 IU/L 92  AST 0 - 40 IU/L 14  ALT 0 - 44 IU/L 12   CBC Latest Ref Rng & Units 03/31/2021  WBC 4.0 - 10.5 K/uL 5.6  Hemoglobin 13.0 - 17.0 g/dL 10.8(L)  Hematocrit 39.0 - 52.0 % 33.1(L)  Platelets 150 - 400 K/uL 103(L)     Assessment and plan- Patient is a 80 y.o. male who is here for follow-up of following issues:  Pancytopenia: Patient has a normal white cell count today with a normal ANC.  Platelets have been stable in the 100s over the last 1 year.  Could be autoimmune as well given his underlying rheumatoid arthritis.  Continue to monitor  Hemoglobin is 10.8 and his baseline runs around 10-11.  However his ferritin levels are lower today as compared to before at 51.  Moreover ferritin levels are not accurate in his case due to his underlying rheumatoid arthritis which can falsely elevated.  Iron studies show low iron saturation of 10% and elevated TIBC of 452 consistent with iron deficiency.  He will therefore proceed with 5 doses of Venofer at this time.  CBC ferritin and iron studies in 3 in 6 months and I will see him in 6 months   Visit Diagnosis 1. Thrombocytopenia ([place])   2. Anemia, unspecified type   3. Vitamin D deficiency   4. Other general symptoms and signs       Dr. Randa Evens, MD, MPH [facility] [phone] 04/07/2021 4:31 PM

## 2021-04-14 ENCOUNTER — Inpatient Hospital Stay: Payer: Medicare Other

## 2021-04-14 ENCOUNTER — Other Ambulatory Visit: Payer: Self-pay

## 2021-04-14 VITALS — BP 167/84 | HR 54 | Temp 98.0°F | Resp 18

## 2021-04-14 DIAGNOSIS — D61818 Other pancytopenia: Secondary | ICD-10-CM | POA: Diagnosis not present

## 2021-04-14 DIAGNOSIS — D509 Iron deficiency anemia, unspecified: Secondary | ICD-10-CM

## 2021-04-14 MED ORDER — IRON SUCROSE 20 MG/ML IV SOLN
200.0000 mg | Freq: Once | INTRAVENOUS | Status: AC
  Administered 2021-04-14: 200 mg via INTRAVENOUS
  Filled 2021-04-14: qty 10

## 2021-04-14 MED ORDER — SODIUM CHLORIDE 0.9 % IV SOLN: 200.0000 mg | INTRAVENOUS | Status: DC

## 2021-04-14 MED ORDER — SODIUM CHLORIDE 0.9 % IV SOLN
Freq: Once | INTRAVENOUS | Status: AC
  Filled 2021-04-14: qty 250

## 2021-04-17 ENCOUNTER — Other Ambulatory Visit: Payer: Self-pay | Admitting: Family Medicine

## 2021-04-20 NOTE — Telephone Encounter (Signed)
Last OV 08/22/2020 on Trazodone okay to fill? Last  fill date 02/25/2021

## 2021-04-21 ENCOUNTER — Inpatient Hospital Stay: Payer: Medicare Other

## 2021-04-21 NOTE — Telephone Encounter (Signed)
This patient needs to be scheduled for follow-up. This medication can be refilled for him.

## 2021-04-21 NOTE — Telephone Encounter (Signed)
Scheduled for 07/22/21 the earliest available.

## 2021-04-28 ENCOUNTER — Other Ambulatory Visit: Payer: Self-pay

## 2021-04-28 ENCOUNTER — Inpatient Hospital Stay: Payer: Medicare Other | Attending: Oncology

## 2021-04-28 ENCOUNTER — Inpatient Hospital Stay: Payer: Medicare Other

## 2021-04-28 ENCOUNTER — Ambulatory Visit (INDEPENDENT_AMBULATORY_CARE_PROVIDER_SITE_OTHER): Payer: Medicare Other

## 2021-04-28 VITALS — BP 149/72 | HR 53 | Temp 97.8°F

## 2021-04-28 DIAGNOSIS — D61818 Other pancytopenia: Secondary | ICD-10-CM | POA: Diagnosis not present

## 2021-04-28 DIAGNOSIS — Z79899 Other long term (current) drug therapy: Secondary | ICD-10-CM | POA: Diagnosis not present

## 2021-04-28 DIAGNOSIS — D509 Iron deficiency anemia, unspecified: Secondary | ICD-10-CM | POA: Insufficient documentation

## 2021-04-28 DIAGNOSIS — Z952 Presence of prosthetic heart valve: Secondary | ICD-10-CM | POA: Diagnosis not present

## 2021-04-28 DIAGNOSIS — R718 Other abnormality of red blood cells: Secondary | ICD-10-CM | POA: Insufficient documentation

## 2021-04-28 DIAGNOSIS — I359 Nonrheumatic aortic valve disorder, unspecified: Secondary | ICD-10-CM | POA: Diagnosis not present

## 2021-04-28 DIAGNOSIS — M069 Rheumatoid arthritis, unspecified: Secondary | ICD-10-CM | POA: Diagnosis not present

## 2021-04-28 LAB — ECHOCARDIOGRAM COMPLETE
AR max vel: 1.02 cm2
AV Area VTI: 1.15 cm2
AV Area mean vel: 1.05 cm2
AV Mean grad: 11.3 mmHg
AV Peak grad: 25.1 mmHg
Ao pk vel: 2.51 m/s
Calc EF: 51.3 %
Single Plane A2C EF: 49.5 %
Single Plane A4C EF: 51.1 %

## 2021-04-28 MED ORDER — SODIUM CHLORIDE 0.9 % IV SOLN: 200.0000 mg | INTRAVENOUS | Status: DC

## 2021-04-28 MED ORDER — IRON SUCROSE 20 MG/ML IV SOLN
200.0000 mg | Freq: Once | INTRAVENOUS | Status: AC
  Administered 2021-04-28: 200 mg via INTRAVENOUS
  Filled 2021-04-28: qty 10

## 2021-04-28 MED ORDER — SODIUM CHLORIDE 0.9 % IV SOLN
Freq: Once | INTRAVENOUS | Status: AC
  Filled 2021-04-28: qty 250

## 2021-04-28 NOTE — Patient Instructions (Signed)

## 2021-05-05 ENCOUNTER — Other Ambulatory Visit: Payer: Self-pay

## 2021-05-05 ENCOUNTER — Telehealth: Payer: Self-pay

## 2021-05-05 ENCOUNTER — Inpatient Hospital Stay: Payer: Medicare Other

## 2021-05-05 VITALS — BP 147/85 | HR 59 | Temp 98.5°F | Resp 16

## 2021-05-05 DIAGNOSIS — D509 Iron deficiency anemia, unspecified: Secondary | ICD-10-CM | POA: Diagnosis not present

## 2021-05-05 MED ORDER — IRON SUCROSE 20 MG/ML IV SOLN
200.0000 mg | Freq: Once | INTRAVENOUS | Status: AC
  Administered 2021-05-05: 200 mg via INTRAVENOUS
  Filled 2021-05-05: qty 10

## 2021-05-05 MED ORDER — FUROSEMIDE 40 MG PO TABS: 40.0000 mg | ORAL_TABLET | ORAL | 3 refills | Status: DC

## 2021-05-05 MED ORDER — SODIUM CHLORIDE 0.9 % IV SOLN
Freq: Once | INTRAVENOUS | Status: AC
  Filled 2021-05-05: qty 250

## 2021-05-05 MED ORDER — SODIUM CHLORIDE 0.9 % IV SOLN: 200.0000 mg | INTRAVENOUS | Status: DC

## 2021-05-05 NOTE — Telephone Encounter (Signed)
Able to reach pt regarding his recent ECHO Dr. [name] had a chance to review his results and advised   "Echo  Normal ejection fraction, stable aortic valve prosthesis  Moderately elevated right heart pressures likely from atrial fibrillation/fluid retention  Would consider going back on Lasix every other day  Without Lasix we will have worsening congestive heart failure"  [name] reports will restart his 40 mg lasix QOD, has some on hand, will call pharmacy when refills needed, otherwise all questions or concerns were address and no additional concerns at this time. Agreeable to plan, will call back for anything further.

## 2021-05-06 ENCOUNTER — Ambulatory Visit (INDEPENDENT_AMBULATORY_CARE_PROVIDER_SITE_OTHER): Payer: Medicare Other | Admitting: Dermatology

## 2021-05-06 DIAGNOSIS — C4441 Basal cell carcinoma of skin of scalp and neck: Secondary | ICD-10-CM | POA: Diagnosis not present

## 2021-05-06 DIAGNOSIS — L578 Other skin changes due to chronic exposure to nonionizing radiation: Secondary | ICD-10-CM

## 2021-05-06 DIAGNOSIS — D485 Neoplasm of uncertain behavior of skin: Secondary | ICD-10-CM

## 2021-05-06 NOTE — Progress Notes (Signed)
Follow-Up Visit   Subjective  Bryan [name] is a 80 y.o. male who presents for the following: Other (Spot behind left ear that will not heal. It has been there about 2 months).The patient has spots, moles and lesions to be evaluated, some may be new or changing and the patient has concerns that these could be cancer.   The following portions of the chart were reviewed this encounter and updated as appropriate:   Tobacco   Allergies   Meds   Problems   Med Hx   Surg Hx   Fam Hx      Review of Systems:  No other skin or systemic complaints except as noted in HPI or Assessment and Plan.  Objective  Well appearing patient in no apparent distress; mood and affect are within normal limits.  A focused examination was performed including scalp. Relevant physical exam findings are noted in the Assessment and Plan.  Left scalp postauricular ant 1.1 cm pink papule  Left scalp postauricular post 1.1 cm pink papule   Assessment & Plan   Actinic Damage - chronic, secondary to cumulative UV radiation exposure/sun exposure over time - diffuse scaly erythematous macules with underlying dyspigmentation - Recommend daily broad spectrum sunscreen SPF 30+ to sun-exposed areas, reapply every 2 hours as needed.  - Recommend staying in the shade or wearing long sleeves, sun glasses (UVA+UVB protection) and wide brim hats (4-inch brim around the entire circumference of the hat). - Call for new or changing lesions.  Neoplasm of uncertain behavior of skin (2) Left scalp postauricular ant  Epidermal / dermal shaving  Lesion diameter (cm):  1.1 Informed consent: discussed and consent obtained   Timeout: patient name, date of birth, surgical site, and procedure verified   Procedure prep:  Patient was prepped and draped in usual sterile fashion Prep type:  Isopropyl alcohol Anesthesia: the lesion was anesthetized in a standard fashion   Anesthetic:  1% lidocaine w/ epinephrine 1-100,000 buffered w/  8.4% NaHCO3 Instrument used: flexible razor blade   Hemostasis achieved with: pressure, aluminum chloride and electrodesiccation   Outcome: patient tolerated procedure well   Post-procedure details: sterile dressing applied and wound care instructions given   Dressing type: bandage and petrolatum    Destruction of lesion Complexity: extensive   Destruction method: electrodesiccation and curettage   Informed consent: discussed and consent obtained   Timeout:  patient name, date of birth, surgical site, and procedure verified Procedure prep:  Patient was prepped and draped in usual sterile fashion Prep type:  Isopropyl alcohol Anesthesia: the lesion was anesthetized in a standard fashion   Anesthetic:  1% lidocaine w/ epinephrine 1-100,000 buffered w/ 8.4% NaHCO3 Curettage performed in three different directions: Yes   Electrodesiccation performed over the curetted area: Yes   Lesion length (cm):  1.1 Lesion width (cm):  1.1 Margin per side (cm):  0.2 Final wound size (cm):  1.5 Hemostasis achieved with:  pressure and aluminum chloride Outcome: patient tolerated procedure well with no complications   Post-procedure details: sterile dressing applied and wound care instructions given   Dressing type: bandage and petrolatum    Specimen 1 - Surgical pathology Differential Diagnosis: BCC vs other  Check Margins: No EDC today  Left scalp postauricular post  Epidermal / dermal shaving  Lesion diameter (cm):  1.1 Informed consent: discussed and consent obtained   Timeout: patient name, date of birth, surgical site, and procedure verified   Procedure prep:  Patient was prepped and draped in  usual sterile fashion Prep type:  Isopropyl alcohol Anesthesia: the lesion was anesthetized in a standard fashion   Anesthetic:  1% lidocaine w/ epinephrine 1-100,000 buffered w/ 8.4% NaHCO3 Instrument used: flexible razor blade   Hemostasis achieved with: pressure, aluminum chloride and  electrodesiccation   Outcome: patient tolerated procedure well   Post-procedure details: sterile dressing applied and wound care instructions given   Dressing type: bandage and petrolatum    Destruction of lesion Complexity: extensive   Destruction method: electrodesiccation and curettage   Informed consent: discussed and consent obtained   Timeout:  patient name, date of birth, surgical site, and procedure verified Procedure prep:  Patient was prepped and draped in usual sterile fashion Prep type:  Isopropyl alcohol Anesthesia: the lesion was anesthetized in a standard fashion   Anesthetic:  1% lidocaine w/ epinephrine 1-100,000 buffered w/ 8.4% NaHCO3 Curettage performed in three different directions: Yes   Electrodesiccation performed over the curetted area: Yes   Lesion length (cm):  1.1 Lesion width (cm):  1.1 Margin per side (cm):  0.2 Final wound size (cm):  1.5 Hemostasis achieved with:  pressure and aluminum chloride Outcome: patient tolerated procedure well with no complications   Post-procedure details: sterile dressing applied and wound care instructions given   Dressing type: bandage and petrolatum    Specimen 2 - Surgical pathology Differential Diagnosis: BCC vs other  Check Margins: No EDC today   Return for 4-6 months , Follow up.  I, Ashok Cordia, CMA, am acting as scribe for Sarina Ser, MD . Documentation: I have reviewed the above documentation for accuracy and completeness, and I agree with the above.  Sarina Ser, MD

## 2021-05-06 NOTE — Patient Instructions (Signed)

## 2021-05-10 ENCOUNTER — Encounter: Payer: Self-pay | Admitting: Dermatology

## 2021-05-11 ENCOUNTER — Telehealth: Payer: Self-pay

## 2021-05-11 NOTE — Telephone Encounter (Signed)
-----   Message from Ralene Bathe, MD sent at 05/07/2021  5:39 PM EST ----- Diagnosis 1. 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 Skin , left scalp postauricular post BASAL CELL CARCINOMA, NODULAR PATTERN, PERIPHERAL AND DEEP MARGINS INVOLVED  1&2 - both cancer - BCC Both already treated Recheck next visit

## 2021-05-11 NOTE — Telephone Encounter (Signed)
LM on VM please return my call  

## 2021-05-11 NOTE — Telephone Encounter (Signed)
Discussed biopsy results with pt  °

## 2021-05-13 ENCOUNTER — Telehealth: Payer: Self-pay

## 2021-05-13 ENCOUNTER — Other Ambulatory Visit: Payer: Self-pay

## 2021-05-13 ENCOUNTER — Ambulatory Visit (INDEPENDENT_AMBULATORY_CARE_PROVIDER_SITE_OTHER): Payer: Medicare Other | Admitting: Dermatology

## 2021-05-13 DIAGNOSIS — R58 Hemorrhage, not elsewhere classified: Secondary | ICD-10-CM

## 2021-05-13 NOTE — Progress Notes (Deleted)
° °  Follow-Up Visit   Subjective  Bryan [name] is a 80 y.o. male who presents for the following: Follow-up (Patient here today for bleeding at the biopsy/EDC sites left postauricular area, biopsies were done 05/06/21.).  ***  The following portions of the chart were reviewed this encounter and updated as appropriate:   Tobacco   Allergies   Meds   Problems   Med Hx   Surg Hx   Fam Hx       Review of Systems:  No other skin or systemic complaints except as noted in HPI or Assessment and Plan.  Objective  Well appearing patient in no apparent distress; mood and affect are within normal limits.  {[id number]::"N full examination was performed including scalp, head, eyes, ears, nose, lips, neck, chest, axillae, abdomen, back, buttocks, bilateral upper extremities, bilateral lower extremities, hands, feet, fingers, toes, fingernails, and toenails. All findings within normal limits unless otherwise noted below."}    Assessment & Plan   No follow-ups on file.

## 2021-05-13 NOTE — Telephone Encounter (Signed)
Patient left message the areas where BCC's were removed and treated are bleeding. Tried to call patient back but had to leave message.Bryan [name]

## 2021-05-13 NOTE — Telephone Encounter (Signed)
Did you mean to route this to his entire care team? Or was this supposed to go to Dr [name]? Just want to make sure this gets to the appropriate provider.

## 2021-05-24 ENCOUNTER — Encounter: Payer: Self-pay | Admitting: Dermatology

## 2021-05-26 NOTE — Progress Notes (Signed)
° °  Follow-Up Visit   Subjective  Bryan [name] is a 81 y.o. male who presents for the following: Follow-up (Patient here today for bleeding at the biopsy/EDC sites left postauricular area, biopsies were done 05/06/21.).  The following portions of the chart were reviewed this encounter and updated as appropriate:   Tobacco   Allergies   Meds   Problems   Med Hx   Surg Hx   Fam Hx      Review of Systems:  No other skin or systemic complaints except as noted in HPI or Assessment and Plan.  Objective  Well appearing patient in no apparent distress; mood and affect are within normal limits.  A focused examination was performed including face, neck, ears. Relevant physical exam findings are noted in the Assessment and Plan.  Left scalp postauricular Bleeding at left neck BCC site   Assessment & Plan  Bleeding Left scalp postauricular  Previous bx proven BCC site, treated with [facility] 05/06/21  hemostasis achieved with AlCl and pressure.  Gelfoam applied then covered with mupirocin and pressure dressing. Patient advised to leave on until tomorrow and may start wound care but should be careful to not removed gelfoam.   Return for as scheduled.  Graciella Belton, RMA, am acting as scribe for Sarina Ser, MD . Documentation: I have reviewed the above documentation for accuracy and completeness, and I agree with the above.  Sarina Ser, MD

## 2021-05-27 DIAGNOSIS — M546 Pain in thoracic spine: Secondary | ICD-10-CM | POA: Diagnosis not present

## 2021-05-27 DIAGNOSIS — F1120 Opioid dependence, uncomplicated: Secondary | ICD-10-CM | POA: Insufficient documentation

## 2021-05-27 DIAGNOSIS — S32009K Unspecified fracture of unspecified lumbar vertebra, subsequent encounter for fracture with nonunion: Secondary | ICD-10-CM | POA: Diagnosis not present

## 2021-05-27 DIAGNOSIS — M5440 Lumbago with sciatica, unspecified side: Secondary | ICD-10-CM | POA: Diagnosis not present

## 2021-06-11 DIAGNOSIS — E7800 Pure hypercholesterolemia, unspecified: Secondary | ICD-10-CM | POA: Diagnosis not present

## 2021-06-11 DIAGNOSIS — Z0100 Encounter for examination of eyes and vision without abnormal findings: Secondary | ICD-10-CM | POA: Diagnosis not present

## 2021-06-11 DIAGNOSIS — I10 Essential (primary) hypertension: Secondary | ICD-10-CM | POA: Diagnosis not present

## 2021-06-24 ENCOUNTER — Other Ambulatory Visit: Payer: Self-pay | Admitting: Cardiovascular Disease

## 2021-06-24 ENCOUNTER — Other Ambulatory Visit: Payer: Self-pay | Admitting: Gastroenterology

## 2021-06-24 DIAGNOSIS — I4819 Other persistent atrial fibrillation: Secondary | ICD-10-CM

## 2021-06-25 ENCOUNTER — Other Ambulatory Visit: Payer: Self-pay

## 2021-06-25 ENCOUNTER — Other Ambulatory Visit: Payer: Self-pay | Admitting: Family Medicine

## 2021-06-25 DIAGNOSIS — Z7901 Long term (current) use of anticoagulants: Secondary | ICD-10-CM

## 2021-06-25 DIAGNOSIS — Z79899 Other long term (current) drug therapy: Secondary | ICD-10-CM

## 2021-06-25 NOTE — Telephone Encounter (Signed)
Please see note below. 

## 2021-06-25 NOTE — Telephone Encounter (Signed)
Refill Request.  

## 2021-06-25 NOTE — Telephone Encounter (Signed)
Prescription refill request for Eliquis received. Indication: PAF Last office visit: 12/15/20  Johnny Bridge MD Scr: 0.92 on 08/26/20 Age: 81 Weight: 72.6kg  Based on above findings Eliquis 5mg  twice daily is the appropriate dose.  Pt is due to have lab work.  Message sent to [facility] staff to schedule pt for CBC/BMP.    Refill approved x 1

## 2021-07-01 ENCOUNTER — Other Ambulatory Visit: Payer: Self-pay | Admitting: *Deleted

## 2021-07-01 ENCOUNTER — Encounter: Payer: Self-pay | Admitting: Hematology and Oncology

## 2021-07-01 DIAGNOSIS — D509 Iron deficiency anemia, unspecified: Secondary | ICD-10-CM

## 2021-07-02 DIAGNOSIS — H353132 Nonexudative age-related macular degeneration, bilateral, intermediate dry stage: Secondary | ICD-10-CM | POA: Diagnosis not present

## 2021-07-02 DIAGNOSIS — Z79899 Other long term (current) drug therapy: Secondary | ICD-10-CM | POA: Diagnosis not present

## 2021-07-02 DIAGNOSIS — M069 Rheumatoid arthritis, unspecified: Secondary | ICD-10-CM | POA: Diagnosis not present

## 2021-07-02 DIAGNOSIS — H43813 Vitreous degeneration, bilateral: Secondary | ICD-10-CM | POA: Diagnosis not present

## 2021-07-08 ENCOUNTER — Inpatient Hospital Stay: Payer: Medicare HMO | Attending: Oncology

## 2021-07-08 ENCOUNTER — Encounter: Payer: Self-pay | Admitting: Hematology and Oncology

## 2021-07-08 ENCOUNTER — Other Ambulatory Visit: Payer: Self-pay

## 2021-07-08 DIAGNOSIS — R6889 Other general symptoms and signs: Secondary | ICD-10-CM

## 2021-07-08 DIAGNOSIS — D508 Other iron deficiency anemias: Secondary | ICD-10-CM | POA: Diagnosis not present

## 2021-07-08 DIAGNOSIS — D696 Thrombocytopenia, unspecified: Secondary | ICD-10-CM | POA: Diagnosis not present

## 2021-07-08 DIAGNOSIS — D509 Iron deficiency anemia, unspecified: Secondary | ICD-10-CM

## 2021-07-08 DIAGNOSIS — E559 Vitamin D deficiency, unspecified: Secondary | ICD-10-CM | POA: Diagnosis not present

## 2021-07-08 LAB — CBC WITH DIFFERENTIAL/PLATELET
Abs Immature Granulocytes: 0.03 10*3/uL (ref 0.00–0.07)
Basophils Absolute: 0 10*3/uL (ref 0.0–0.1)
Basophils Relative: 0 %
Eosinophils Absolute: 0 10*3/uL (ref 0.0–0.5)
Eosinophils Relative: 0 %
HCT: 34 % — ABNORMAL LOW (ref 39.0–52.0)
Hemoglobin: 10.8 g/dL — ABNORMAL LOW (ref 13.0–17.0)
Immature Granulocytes: 0 %
Lymphocytes Relative: 10 %
Lymphs Abs: 0.7 10*3/uL (ref 0.7–4.0)
MCH: 31.5 pg (ref 26.0–34.0)
MCHC: 31.8 g/dL (ref 30.0–36.0)
MCV: 99.1 fL (ref 80.0–100.0)
Monocytes Absolute: 0.4 10*3/uL (ref 0.1–1.0)
Monocytes Relative: 6 %
Neutro Abs: 6.2 10*3/uL (ref 1.7–7.7)
Neutrophils Relative %: 84 %
Platelets: 111 10*3/uL — ABNORMAL LOW (ref 150–400)
RBC: 3.43 MIL/uL — ABNORMAL LOW (ref 4.22–5.81)
RDW: 13.5 % (ref 11.5–15.5)
WBC: 7.4 10*3/uL (ref 4.0–10.5)
nRBC: 0 % (ref 0.0–0.2)

## 2021-07-08 LAB — IRON AND TIBC
Iron: 51 ug/dL (ref 45–182)
Saturation Ratios: 14 % — ABNORMAL LOW (ref 17.9–39.5)
TIBC: 357 ug/dL (ref 250–450)
UIBC: 306 ug/dL

## 2021-07-08 LAB — FERRITIN: Ferritin: 158 ng/mL (ref 24–336)

## 2021-07-08 LAB — VITAMIN B12: Vitamin B-12: 322 pg/mL (ref 180–914)

## 2021-07-09 ENCOUNTER — Other Ambulatory Visit: Payer: Self-pay | Admitting: Family Medicine

## 2021-07-21 ENCOUNTER — Encounter: Payer: Self-pay | Admitting: Oncology

## 2021-07-21 ENCOUNTER — Telehealth: Payer: Self-pay | Admitting: Cardiovascular Disease

## 2021-07-21 DIAGNOSIS — M1711 Unilateral primary osteoarthritis, right knee: Secondary | ICD-10-CM | POA: Diagnosis not present

## 2021-07-21 NOTE — Telephone Encounter (Signed)
° °  Pre-operative Risk Assessment    Patient Name: Bryan [name]  DOB: [birth year] MRN: [id number]      Request for Surgical Clearance    Procedure:   RT TKA Traditional Knee  Date of Surgery:  Clearance 08/19/21                                 Surgeon:  Dr Kurtis Bushman Surgeon's Group or Practice Name:  [facility] Phone number:  [phone] Fax number:  [phone]   Type of Clearance Requested:   - Medical    Type of Anesthesia:  Local / Spinal   Additional requests/questions:    SignedAce Gins   07/21/2021, 4:20 PM

## 2021-07-22 ENCOUNTER — Ambulatory Visit: Payer: Medicare Other | Admitting: Family Medicine

## 2021-07-22 NOTE — Telephone Encounter (Signed)
Will send a message to our [place] office to please assist in finding an appt for pre op clearance for the pt.  ?

## 2021-07-23 NOTE — Telephone Encounter (Signed)
Pt has appt 08/17/21 for pre op clearance ?

## 2021-08-03 ENCOUNTER — Encounter: Payer: Self-pay | Admitting: Hematology and Oncology

## 2021-08-03 ENCOUNTER — Other Ambulatory Visit: Payer: Self-pay | Admitting: Family Medicine

## 2021-08-03 ENCOUNTER — Other Ambulatory Visit: Payer: Self-pay | Admitting: Cardiovascular Disease

## 2021-08-03 DIAGNOSIS — I4819 Other persistent atrial fibrillation: Secondary | ICD-10-CM

## 2021-08-03 NOTE — Progress Notes (Unsigned)
Date:  08/03/2021   ID:  Bryan [name], DOB Jun 21, 1940, MRN [id number]  Patient Location:  [place] [address]   Provider location:   [facility] [place] office  PCP:  [name], MD  Cardiologist:  [facility]  No chief complaint on file.   History of Present Illness:    Bryan [name] is a 81 y.o. 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 bicuspid aortic valve noted in 2009   cardiac catheterization showing mild to moderate CAD,  AVR with bioprosthetic valve, ascending aorta grafting in 2010,  hypertension,  hyperlipidemia,  mild chronic renal insufficiency, total knee replacement in 2004, appendix rupture and gallbladder surgery in 2010,  postoperative atrial fibrillation in 2010 pulm HTN on echo 2017 Valve with mean gradient 20 mm Hg, moderate, prosthetic valve who presents for routine follow-up of his bioprosthetic aortic valve and dilated ascending aorta , permanent atrial fibrillation  Last seen in clinic January 2022 On that visit had restarted amlodipine on his own for high pressure Weight was up 10 pounds, eating better  Neuropathy in his feet, chronic Unable to walk very far, chronic knee pain Prior history of falls, giving out Lives by himself in an apartment  Echocardiogram June 2021 normal LV function,  moderate aortic valve stenosis  ABD surgery at [facility] to rehab Taking imodium for diarrhea several times a day  On eliquis Off lasix BP stable  No edema Weight drop after surgery, recovering now Lost some muscle  Chronic back pain, sees pain management in [place] Right knee pain   EKG personally reviewed by myself on todays visit Atrial fibrillation rate 62 bpm no significant ST-T wave changes  Other past medical history reviewed  Event monitor Atrial Fibrillation occurred continuously (100% burden), ranging from 31- 134  bpm (avg of 62 bpm). Slow ventricular rate noted overnight, early a.m. Isolated VEs were rare (<1.0%), and no VE Couplets or VE Triplets were present. 1 patient triggered event, not associated with significant arrhythmia other than atrial fibrillation  confusion January 09, 2020, was seen in the emergency room was not making sense, had a headache, was vomiting Was seen in the emergency room but left secondary to high patient volume and he was feeling better Feels back to his baseline today  Seen in CHF clinic January 02, 2020, weight at that time 162 pounds Weight  November 23, 2019 166 pounds Weight April 2021 was 160 pounds Weight in oncology 178 pounds performed yesterday His home weight today 168 pounds but is 173 pounds in our office  Echocardiogram November 13, 2019 performed for shortness of breath Normal ejection fraction, moderate pulmonary hypertension Moderate aortic valve stenosis Mean gradient 26 mmHg Dilated IVC  CT scan chest June 2021 with mild/small pleural effusion  Chronic back pain, multiple surgery "better" had hardware taken out Several back surgeries Back surgery  5/18 chroni back and leg pain, Fx of L4, [place]  Fusion  Past Medical History:  Diagnosis Date   Abnormal CT scan    Asymmetric left rectal wall thickening    Anemia    Anxiety    Arrhythmia    Basal cell carcinoma 04/28/2020   L forehead - ED&C 06/17/2020   Basal cell carcinoma 05/06/2021   left scalp postauricular ant, EDC   Basal cell carcinoma 05/06/2021   left scalp postauricular post, EDC   CHF (congestive heart failure) (  [place])    Chronic lower back pain    Complication of anesthesia    Memory loss 09/2015   Coronary artery disease    Depression    GERD (gastroesophageal reflux disease)    Glaucoma    H/O thoracic aortic aneurysm repair    Headache    Heart murmur    History of being hospitalized    memory lose kidney funtion down blood pressure up   History of blood transfusion     "think he had one when he had heart valve OR" (10/14/2016); "none since" (10/19/2017)   History of chicken pox    Hyperlipidemia    Hypertension    Lumbar stenosis    Murmur    Neuropathy    Osteoarthritis    worse in feet and ankles   Paroxysmal A-fib ([place])    Poor short term memory    takes Aricept   Rheumatoid arthritis ([place])    "all over" (10/14/2016)   Schizophrenia ([place])    Seasonal allergies    Thoracic spine pain    Valvular heart disease    Wears glasses    reading   Past Surgical History:  Procedure Laterality Date   AORTIC VALVE REPLACEMENT  2007   [facility].  [place], [place]; "pig valve"   APPENDECTOMY  2010   BACK SURGERY     CARDIAC VALVE REPLACEMENT     CATARACT EXTRACTION W/ INTRAOCULAR LENS  IMPLANT, BILATERAL Bilateral 2012   COLONOSCOPY WITH PROPOFOL N/A 09/24/2016   Procedure: COLONOSCOPY WITH PROPOFOL;  Surgeon: Jonathon Bellows, MD;  Location: [facility] ENDOSCOPY;  Service: Endoscopy;  Laterality: N/A;   ESOPHAGOGASTRODUODENOSCOPY (EGD) WITH PROPOFOL N/A 09/24/2016   Procedure: ESOPHAGOGASTRODUODENOSCOPY (EGD) WITH PROPOFOL;  Surgeon: Jonathon Bellows, MD;  Location: [facility] ENDOSCOPY;  Service: Endoscopy;  Laterality: N/A;   GIVENS CAPSULE STUDY N/A 09/24/2016   Procedure: GIVENS CAPSULE STUDY;  Surgeon: Jonathon Bellows, MD;  Location: [facility] ENDOSCOPY;  Service: Endoscopy;  Laterality: N/A;   HAMMER TOE SURGERY Right 10/09/2015   Procedure: HAMMER TOE REPAIR WITH K-WIRE FIXATION RIGHT SECOND TOE;  Surgeon: Albertine Patricia, DPM;  Location: [place];  Service: Podiatry;  Laterality: Right;  WITH LOCAL   HARDWARE REMOVAL N/A 11/02/2019   Procedure: Removal of hardware thoracic spine;  Surgeon: Kary Kos, MD;  Location: [place];  Service: Neurosurgery;  Laterality: N/A;  posterior   INGUINAL HERNIA REPAIR Right 05/05/2018   Medium Bard PerFix plug.  Surgeon: Robert Bellow, MD;  Location: [facility] ORS;  Service: General;  Laterality: Right;   IR VERTEBROPLASTY CERV/THOR BX INC  UNI/BIL INC/INJECT/IMAGING  10/27/2018   JOINT REPLACEMENT Left    left total knee   LAMINECTOMY WITH POSTERIOR LATERAL ARTHRODESIS LEVEL 3 N/A 09/28/2017   Procedure: LUMBAR  POSTEROR  FUSION REVISION - LUMBAR ONE -TWO, LUMBAR TWO -THREE,THREE-FOUR ,BILATERAL ARTHRODESIS REMOVAL LUMBAR THREE HARDWARE;  Surgeon: Kary Kos, MD;  Location: [place];  Service: Neurosurgery;  Laterality: N/A;   LAMINECTOMY WITH POSTERIOR LATERAL ARTHRODESIS LEVEL 3 N/A 10/20/2017   Procedure: Revision fusion and removal of hardware Lumbar one, Pedicle screw fixation thoracic ten-lumbar two, thoracic nine-ten laminotomy, Posterior Lumbar Arthrodesis thoracic ten-lumbar two ;  Surgeon: Kary Kos, MD;  Location: [place];  Service: Neurosurgery;  Laterality: N/A;   LAPAROSCOPIC CHOLECYSTECTOMY  2010   LUMBAR FUSION Right 06/08/2017   LUMBAR THREE-FOUR, LUMBAR FOUR-FIVE POSTEROLATERAL ARTHRODESIS WITH RIGHT LUMBAR FOUR-FIVE LAMINECTOMY/FORAMINOTOMY   LUMBAR LAMINECTOMY/DECOMPRESSION MICRODISCECTOMY Right 03/08/2016   Procedure: Laminectomy and Foraminotomy - Lumbar  Five-Sacral One Right;  Surgeon: Kary Kos, MD;  Location: [place];  Service: Neurosurgery;  Laterality: Right;  Right   MULTIPLE TOOTH EXTRACTIONS     "3"   POSTERIOR LUMBAR FUSION  10/14/2016   L5-S1   REPLACEMENT TOTAL KNEE Left 2003   THORACIC AORTIC ANEURYSM REPAIR  2010     No outpatient medications have been marked as taking for the 08/04/21 encounter (Appointment) with Minna Merritts, MD.     Allergies:   Penicillins, Demerol [meperidine], and Other   Social History   Tobacco Use   Smoking status: Former    Packs/day: 1.00    Years: 32.00    Pack years: 32.00    Types: Cigarettes    Quit date: 07/05/1981    Years since quitting: 40.1   Smokeless tobacco: Never  Vaping Use   Vaping Use: Never used  Substance Use Topics   Alcohol use: Yes    Comment: "glass of wine/month; if that"   Drug use: Never     Family Hx: The patient's family  history includes Asthma in his mother; Heart attack (age of onset: 37) in his father; Heart failure in his mother; Hypertension in his mother and sister; Osteoporosis in his mother. There is no history of Prostate cancer, Bladder Cancer, Kidney cancer, or Colon cancer.  ROS:   Please see the history of present illness.    Review of Systems  Constitutional:  Positive for weight loss.  HENT: Negative.    Respiratory: Negative.    Cardiovascular: Negative.   Gastrointestinal: Negative.   Musculoskeletal: Negative.   Neurological: Negative.   Psychiatric/Behavioral: Negative.    All other systems reviewed and are negative.   Labs/Other Tests and Data Reviewed:    Recent Labs: 08/26/2020: ALT 12; BUN 20; Creatinine, Ser 0.92; Magnesium 1.6; Potassium 5.0; Sodium 137 07/08/2021: Hemoglobin 10.8; Platelets 111   Recent Lipid Panel Lab Results  Component Value Date/Time   CHOL 88 08/22/2020 12:07 PM   CHOL 123 09/11/2019 03:38 PM   TRIG 132.0 08/22/2020 12:07 PM   HDL 38.40 (L) 08/22/2020 12:07 PM   HDL 58 09/11/2019 03:38 PM   CHOLHDL 2 08/22/2020 12:07 PM   LDLCALC 23 08/22/2020 12:07 PM   LDLCALC 47 09/11/2019 03:38 PM    Wt Readings from Last 3 Encounters:  04/07/21 166 lb 9.6 oz (75.6 kg)  02/06/21 160 lb (72.6 kg)  12/15/20 160 lb 12.8 oz (72.9 kg)     Exam:    There were no vitals taken for this visit. Constitutional:  oriented to person, place, and time. No distress.  HENT:  Head: Grossly normal Eyes:  no discharge. No scleral icterus.  Neck: No JVD, no carotid bruits  Cardiovascular: Regular rate and rhythm, no murmurs appreciated Pulmonary/Chest: Clear to auscultation bilaterally, no wheezes or rails Abdominal: Soft.  no distension.  no tenderness.  Musculoskeletal: Normal range of motion Neurological:  normal muscle tone. Coordination normal. No atrophy Skin: Skin warm and dry Psychiatric: normal affect, pleasant   ASSESSMENT & PLAN:    Permanent atrial  fibrillation (HCC) -  Permanent atrial fibrillation,  Tolerating Eliquis 5 mg twice daily Denies any recent falls Metoprolol held previously for bradycardia   Essential hypertension - Plan: EKG 12-Lead Blood pressure is well controlled on today's visit. No changes made to the medications. stable   CAD in native artery - Plan: EKG 12-Lead Currently with no symptoms of angina. No further workup at this time. Continue current medication regimen.  Acute on chronic diastolic CHF Euvolemic on today's visit,  Not on lasix Strongly recommended he take Lasix for abdominal bloating or leg swelling   Aneurysm of ascending aorta, s/post repair/grafting Aortic root/ascending aorta has been repaired/replaced. There is mild  dilatation of the aortic root measuring 44 mm. Repeat echocardiogram ordered December 2022   Mixed hyperlipidemia Cholesterol is at goal on the current lipid regimen. No changes to the medications were made.  S/P AVR (aortic valve replacement) Stable on echocardiogram Moderate gradient,  mild murmur on exam, asymptomatic Will need periodic echo.  Last July 2021   Cognitive decline No family presenting with him today Lives alone, doing well on todays visit    Total encounter time more than 25 minutes  Greater than 50% was spent in counseling and coordination of care with the patient    Signed, Ida Rogue, MD  08/03/2021 1:55 PM    [facility] [facility] [address] [place] [address], [place], [place] [address]

## 2021-08-04 ENCOUNTER — Ambulatory Visit: Payer: Medicare (Managed Care) | Admitting: Cardiovascular Disease

## 2021-08-04 ENCOUNTER — Encounter: Payer: Self-pay | Admitting: Cardiovascular Disease

## 2021-08-04 ENCOUNTER — Other Ambulatory Visit: Payer: Self-pay

## 2021-08-04 VITALS — BP 129/68 | HR 61 | Ht 71.0 in | Wt 164.0 lb

## 2021-08-04 DIAGNOSIS — Z01818 Encounter for other preprocedural examination: Secondary | ICD-10-CM

## 2021-08-04 DIAGNOSIS — Z952 Presence of prosthetic heart valve: Secondary | ICD-10-CM | POA: Diagnosis not present

## 2021-08-04 DIAGNOSIS — I359 Nonrheumatic aortic valve disorder, unspecified: Secondary | ICD-10-CM | POA: Diagnosis not present

## 2021-08-04 DIAGNOSIS — I7121 Aneurysm of the ascending aorta, without rupture: Secondary | ICD-10-CM | POA: Diagnosis not present

## 2021-08-04 DIAGNOSIS — I4821 Permanent atrial fibrillation: Secondary | ICD-10-CM | POA: Diagnosis not present

## 2021-08-04 DIAGNOSIS — I10 Essential (primary) hypertension: Secondary | ICD-10-CM | POA: Diagnosis not present

## 2021-08-04 DIAGNOSIS — E782 Mixed hyperlipidemia: Secondary | ICD-10-CM | POA: Diagnosis not present

## 2021-08-04 DIAGNOSIS — I272 Pulmonary hypertension, unspecified: Secondary | ICD-10-CM | POA: Diagnosis not present

## 2021-08-04 DIAGNOSIS — I5032 Chronic diastolic (congestive) heart failure: Secondary | ICD-10-CM

## 2021-08-04 NOTE — Telephone Encounter (Signed)
Prescription refill request for Eliquis received. ?Indication: PAF ?Last office visit: 12/15/20  Johnny Bridge MD   ?Scr: 0.92 on 08/26/20 ?Age: 81 ?Weight: 72.6 ? ?Based on above findings Eliquis '5mg'$  twice daily is the appropriate dose.  Pt has an appointment with Dr [name] today.for surgical clearance.  Requested BMP be done for future Eliquis refills.  Refill approved. ? ?

## 2021-08-04 NOTE — Patient Instructions (Addendum)
Please hold the eliquis on March 27-29th for surgery ?Last dose March 26 pm ?Ask surgery when to restart after procedure ? ? ?Medication Instructions:  ?No changes ? ?If you need a refill on your cardiac medications before your next appointment, please call your pharmacy.  ? ?Lab work: ?Call office to schedule CBC and BMP if surgical clearance dose not draw your labs ? ?Testing/Procedures: ?No new testing needed ? ?Follow-Up: ?At [facility], you and your health needs are our priority.  As part of our continuing mission to provide you with exceptional heart care, we have created designated Provider Care Teams.  These Care Teams include your primary Cardiologist (physician) and Advanced Practice Providers (APPs -  Physician Assistants and Nurse Practitioners) who all work together to provide you with the care you need, when you need it. ? ?You will need a follow up appointment in 12 months ? ?Providers on your designated Care Team:   ?Murray Hodgkins, NP ?Christell Faith, PA-C ?Cadence Kathlen Mody, PA-C ? ?COVID-19 Vaccine Information can be found at: [email or web address].[email or web address] For questions related to vaccine distribution or appointments, please email [email or web address] or call [phone].  ? ?

## 2021-08-10 NOTE — Telephone Encounter (Signed)
Needs approval to stop Eliquis 3 days prior surgery 08/19/21, please assist ?

## 2021-08-10 NOTE — Telephone Encounter (Signed)
Patient with diagnosis of afib on Eliquis for anticoagulation.   ? ?Procedure: RT TKA Traditional Knee ?Date of procedure: 08/19/21 ? ? ?CHA2DS2-VASc Score = 5  ? This indicates a 7.2% annual risk of stroke. ?The patient's score is based upon: ?CHF History: 1 ?HTN History: 1 ?Diabetes History: 0 ?Stroke History: 0 ?Vascular Disease History: 1 ?Age Score: 2 ?Gender Score: 0 ?  ?  ? ?CrCl 64 ml/min ? ?Per office protocol, patient can hold Eliquis for 3 days prior to procedure.   ? ? ?

## 2021-08-13 DIAGNOSIS — F1120 Opioid dependence, uncomplicated: Secondary | ICD-10-CM | POA: Diagnosis not present

## 2021-08-13 DIAGNOSIS — M546 Pain in thoracic spine: Secondary | ICD-10-CM | POA: Diagnosis not present

## 2021-08-13 DIAGNOSIS — M5440 Lumbago with sciatica, unspecified side: Secondary | ICD-10-CM | POA: Diagnosis not present

## 2021-08-17 ENCOUNTER — Ambulatory Visit: Payer: Medicare HMO | Admitting: Medical

## 2021-08-17 DIAGNOSIS — M1711 Unilateral primary osteoarthritis, right knee: Secondary | ICD-10-CM | POA: Diagnosis not present

## 2021-08-17 DIAGNOSIS — Z01812 Encounter for preprocedural laboratory examination: Secondary | ICD-10-CM | POA: Diagnosis not present

## 2021-08-21 ENCOUNTER — Ambulatory Visit (INDEPENDENT_AMBULATORY_CARE_PROVIDER_SITE_OTHER): Payer: Medicare (Managed Care) | Admitting: Family Medicine

## 2021-08-21 DIAGNOSIS — S81801A Unspecified open wound, right lower leg, initial encounter: Secondary | ICD-10-CM

## 2021-08-21 DIAGNOSIS — I10 Essential (primary) hypertension: Secondary | ICD-10-CM | POA: Diagnosis not present

## 2021-08-21 DIAGNOSIS — M5136 Other intervertebral disc degeneration, lumbar region: Secondary | ICD-10-CM

## 2021-08-21 DIAGNOSIS — K582 Mixed irritable bowel syndrome: Secondary | ICD-10-CM

## 2021-08-21 NOTE — Progress Notes (Signed)
?Bryan Rumps, MD ?Phone: [phone] ? ?Bryan [name] is a 81 y.o. male who presents today for follow-up. ? ?Cut on right leg: Patient notes 2 weeks ago he bumped into a chair and had an abrasion on his right anterior lower leg.  He was set to have his right knee replaced this week though the surgeon wanted to delay it given the wound on his right lower leg.  Notes there has been no spreading redness or purulent drainage.  He notes it has been healing though it has been slow.  He has been using topical antibiotic ointment on it. ? ?Chronic back pain: Patient reports this is stable.  He has had 6 surgeries and notes he has been advised that he cannot have further surgeries given that his bones were soft.  He takes Vicodin for pain management.  This is prescribed by the neurosurgery office. ? ?Hypertension: Patient notes his blood pressure has been well controlled.  He continues on lisinopril and amlodipine.  No chest pain, shortness of breath, or edema. ? ?IBS: Patient has chronic intermittent issues with diarrhea.  He does take Imodium periodically. ? ?Social History  ? ?Tobacco Use  ?Smoking Status Former  ? Packs/day: 1.00  ? Years: 32.00  ? Pack years: 32.00  ? Types: Cigarettes  ? Quit date: 07/05/1981  ? Years since quitting: 40.1  ?Smokeless Tobacco Never  ? ? ?Current Outpatient Medications on File Prior to Visit  ?Medication Sig Dispense Refill  ? amLODipine (NORVASC) 5 MG tablet TAKE 1 TABLET BY MOUTH DAILY. 90 tablet 2  ? atorvastatin (LIPITOR) 40 MG tablet TAKE ONE TABLET BY MOUTH EVERY DAY 90 tablet 3  ? calcium carbonate (OS-CAL - DOSED IN MG OF ELEMENTAL CALCIUM) 1250 (500 Ca) MG tablet Take 1 tablet by mouth daily with lunch.    ? cetirizine (ZYRTEC) 10 MG tablet Take 10 mg by mouth daily.    ? diphenhydrAMINE (SOMINEX) 25 MG tablet Take by mouth.    ? donepezil (ARICEPT) 10 MG tablet Take by mouth.    ? ELIQUIS 5 MG TABS tablet TAKE ONE TABLET BY MOUTH TWICE DAILY **NEEDS LAB WORK DONE  BEFORE NEXT FILL** 180 tablet 0  ? fenofibrate 160 MG tablet TAKE ONE TABLET AT BEDTIME 90 tablet 3  ? FLUoxetine (PROZAC) 20 MG capsule TAKE 1 CAPSULE BY MOUTH ONCE DAILY 90 capsule 1  ? furosemide (LASIX) 40 MG tablet Take 1 tablet (40 mg total) by mouth every other day. 45 tablet 3  ? gabapentin (NEURONTIN) 300 MG capsule TAKE 1 CAPSULE BY MOUTH 3 TIMES DAILY. 270 capsule 0  ? HYDROcodone-acetaminophen (NORCO/VICODIN) 5-325 MG tablet Take 1 tablet by mouth 4 (four) times daily as needed for moderate pain.    ? hydrocortisone cream 1 % Apply 1 application topically daily as needed for itching.    ? hydroxychloroquine (PLAQUENIL) 200 MG tablet TAKE 1 TABLET BY MOUTH DAILY (Patient taking differently: 3 (three) times a week.) 30 tablet 5  ? lisinopril (ZESTRIL) 20 MG tablet daily.    ? loperamide (IMODIUM) 2 MG capsule Take 4 mg by mouth in the morning and at bedtime.    ? Multiple Vitamins-Minerals (PRESERVISION AREDS 2) CAPS Take 1 capsule by mouth 2 (two) times daily.    ? omeprazole (PRILOSEC) 40 MG capsule TAKE 1 CAPSULE EVERY DAY 90 capsule 3  ? traZODone (DESYREL) 50 MG tablet TAKE 1 OR 2 TABLETS BY MOUTH AT BEDTIME AS NEEDED. 90 tablet 1  ? vitamin B-12 (  CYANOCOBALAMIN) 1000 MCG tablet Take 1,000 mcg by mouth daily with lunch.     ? Vitamin D, Ergocalciferol, (DRISDOL) 1.25 MG (50000 UNIT) CAPS capsule Take 50,000 Units by mouth once a week.    ? vitamin E 180 MG (400 UNITS) capsule Take 800 Units by mouth daily with lunch.    ? ?Current Facility-Administered Medications on File Prior to Visit  ?Medication Dose Route Frequency Provider Last Rate Last Admin  ? 0.9 %  sodium chloride infusion   Intravenous Continuous Lequita Asal, MD 10 mL/hr at 12/11/19 1123 New Bag at 12/11/19 1123  ? ? ? ?ROS see history of present illness ? ?Objective ? ?Physical Exam ?Vitals:  ? 08/21/21 1625  ?BP: 110/60  ?Pulse: 82  ?Temp: 98.2 ?F (36.8 ?C)  ?SpO2: 96%  ? ? ?BP Readings from Last 3 Encounters:  ?08/21/21 110/60   ?08/04/21 129/68  ?05/05/21 (!) 147/85  ? ?Wt Readings from Last 3 Encounters:  ?08/21/21 171 lb 12.8 oz (77.9 kg)  ?08/04/21 164 lb (74.4 kg)  ?04/07/21 166 lb 9.6 oz (75.6 kg)  ? ? ?Physical Exam ?Constitutional:   ?   General: He is not in acute distress. ?   Appearance: He is not diaphoretic.  ?Pulmonary:  ?   Effort: Pulmonary effort is normal.  ?Skin: ?   General: Skin is warm and dry.  ? ?    ?Neurological:  ?   Mental Status: He is alert.  ? ? ? ?Assessment/Plan: Please see individual problem list. ? ?Problem List Items Addressed This Visit   ? ? DDD (degenerative disc disease), lumbar  ?  Chronic issue.  He will continue to see neurosurgery. ?  ?  ? Essential hypertension  ?  Well-controlled.  He will continue lisinopril 20 mg daily and amlodipine 5 mg daily.  He reports he just had lab work for presurgical labs. ?  ?  ? IBS (irritable bowel syndrome)  ?  Chronic issue.  Stable.  He can continue as needed Imodium. ?  ?  ? Leg wound, right, initial encounter  ?  He has a small wound on his right lower extremity.  This appears to be healing on its own though somewhat slowly.  There is no evidence of infection at this time.  Discussed cleansing it with soap and water.  Discussed covering it up if he thinks it is going to get dirty.  Advised to monitor for signs of infection such as spreading redness, swelling, and purulent drainage. ?  ?  ? ? ? ?Return in about 6 months (around 02/20/2022). ? ?This visit occurred during the SARS-CoV-2 public health emergency.  Safety protocols were in place, including screening questions prior to the visit, additional usage of staff PPE, and extensive cleaning of exam room while observing appropriate contact time as indicated for disinfecting solutions.  ? ? ?Bryan Rumps, MD ?[place] ?

## 2021-08-21 NOTE — Assessment & Plan Note (Signed)
Chronic issue.  He will continue to see neurosurgery. ?

## 2021-08-21 NOTE — Assessment & Plan Note (Addendum)
Chronic issue.  Stable.  He can continue as needed Imodium. ?

## 2021-08-21 NOTE — Patient Instructions (Signed)
Nice to see you. ?Please cleanse the area on your leg with soap and water.  If you feel as though you are going to do something that will get the area dirty please wear a bandage.  You do not have to use the triple antibiotic ointment unless you want to.  If you develop spreading redness, swelling, or drainage of pus please let us know immediately. ?

## 2021-08-21 NOTE — Assessment & Plan Note (Signed)
Well-controlled.  He will continue lisinopril 20 mg daily and amlodipine 5 mg daily.  He reports he just had lab work for presurgical labs. ?

## 2021-08-21 NOTE — Assessment & Plan Note (Signed)
He has a small wound on his right lower extremity.  This appears to be healing on its own though somewhat slowly.  There is no evidence of infection at this time.  Discussed cleansing it with soap and water.  Discussed covering it up if he thinks it is going to get dirty.  Advised to monitor for signs of infection such as spreading redness, swelling, and purulent drainage. ?

## 2021-08-22 HISTORY — PX: TOTAL KNEE ARTHROPLASTY: SHX125

## 2021-08-24 ENCOUNTER — Other Ambulatory Visit: Payer: Self-pay | Admitting: Family Medicine

## 2021-08-24 DIAGNOSIS — F4329 Adjustment disorder with other symptoms: Secondary | ICD-10-CM

## 2021-08-24 DIAGNOSIS — F419 Anxiety disorder, unspecified: Secondary | ICD-10-CM

## 2021-08-27 DIAGNOSIS — M1711 Unilateral primary osteoarthritis, right knee: Secondary | ICD-10-CM | POA: Diagnosis not present

## 2021-08-27 DIAGNOSIS — J301 Allergic rhinitis due to pollen: Secondary | ICD-10-CM | POA: Diagnosis not present

## 2021-08-27 DIAGNOSIS — I10 Essential (primary) hypertension: Secondary | ICD-10-CM | POA: Diagnosis not present

## 2021-08-27 DIAGNOSIS — I482 Chronic atrial fibrillation, unspecified: Secondary | ICD-10-CM | POA: Diagnosis not present

## 2021-08-27 DIAGNOSIS — D696 Thrombocytopenia, unspecified: Secondary | ICD-10-CM | POA: Diagnosis not present

## 2021-08-27 DIAGNOSIS — M25561 Pain in right knee: Secondary | ICD-10-CM | POA: Diagnosis not present

## 2021-08-27 DIAGNOSIS — Z4789 Encounter for other orthopedic aftercare: Secondary | ICD-10-CM | POA: Diagnosis not present

## 2021-08-27 DIAGNOSIS — K529 Noninfective gastroenteritis and colitis, unspecified: Secondary | ICD-10-CM | POA: Diagnosis not present

## 2021-08-27 DIAGNOSIS — G8918 Other acute postprocedural pain: Secondary | ICD-10-CM | POA: Diagnosis not present

## 2021-08-27 DIAGNOSIS — F0390 Unspecified dementia without behavioral disturbance: Secondary | ICD-10-CM | POA: Diagnosis not present

## 2021-08-27 DIAGNOSIS — R197 Diarrhea, unspecified: Secondary | ICD-10-CM | POA: Diagnosis not present

## 2021-08-27 DIAGNOSIS — E785 Hyperlipidemia, unspecified: Secondary | ICD-10-CM | POA: Diagnosis not present

## 2021-08-27 DIAGNOSIS — Z96651 Presence of right artificial knee joint: Secondary | ICD-10-CM | POA: Diagnosis not present

## 2021-08-27 DIAGNOSIS — Z87891 Personal history of nicotine dependence: Secondary | ICD-10-CM | POA: Diagnosis not present

## 2021-08-27 DIAGNOSIS — Z952 Presence of prosthetic heart valve: Secondary | ICD-10-CM | POA: Diagnosis not present

## 2021-08-27 DIAGNOSIS — M069 Rheumatoid arthritis, unspecified: Secondary | ICD-10-CM | POA: Diagnosis not present

## 2021-08-27 DIAGNOSIS — D509 Iron deficiency anemia, unspecified: Secondary | ICD-10-CM | POA: Diagnosis not present

## 2021-08-27 DIAGNOSIS — N400 Enlarged prostate without lower urinary tract symptoms: Secondary | ICD-10-CM | POA: Diagnosis not present

## 2021-08-27 DIAGNOSIS — Z880 Allergy status to penicillin: Secondary | ICD-10-CM | POA: Diagnosis not present

## 2021-08-27 DIAGNOSIS — Z79899 Other long term (current) drug therapy: Secondary | ICD-10-CM | POA: Diagnosis not present

## 2021-08-27 DIAGNOSIS — K219 Gastro-esophageal reflux disease without esophagitis: Secondary | ICD-10-CM | POA: Diagnosis not present

## 2021-08-27 DIAGNOSIS — M6281 Muscle weakness (generalized): Secondary | ICD-10-CM | POA: Diagnosis not present

## 2021-08-27 DIAGNOSIS — G4700 Insomnia, unspecified: Secondary | ICD-10-CM | POA: Diagnosis not present

## 2021-09-02 DIAGNOSIS — I10 Essential (primary) hypertension: Secondary | ICD-10-CM | POA: Diagnosis not present

## 2021-09-02 DIAGNOSIS — I498 Other specified cardiac arrhythmias: Secondary | ICD-10-CM | POA: Diagnosis not present

## 2021-09-02 DIAGNOSIS — J301 Allergic rhinitis due to pollen: Secondary | ICD-10-CM | POA: Diagnosis not present

## 2021-09-02 DIAGNOSIS — G4700 Insomnia, unspecified: Secondary | ICD-10-CM | POA: Diagnosis not present

## 2021-09-02 DIAGNOSIS — M6281 Muscle weakness (generalized): Secondary | ICD-10-CM | POA: Diagnosis not present

## 2021-09-02 DIAGNOSIS — Z96651 Presence of right artificial knee joint: Secondary | ICD-10-CM | POA: Diagnosis not present

## 2021-09-02 DIAGNOSIS — E785 Hyperlipidemia, unspecified: Secondary | ICD-10-CM | POA: Diagnosis not present

## 2021-09-02 DIAGNOSIS — K219 Gastro-esophageal reflux disease without esophagitis: Secondary | ICD-10-CM | POA: Diagnosis not present

## 2021-09-02 DIAGNOSIS — M069 Rheumatoid arthritis, unspecified: Secondary | ICD-10-CM | POA: Diagnosis not present

## 2021-09-02 DIAGNOSIS — Z4789 Encounter for other orthopedic aftercare: Secondary | ICD-10-CM | POA: Diagnosis not present

## 2021-09-09 DIAGNOSIS — I498 Other specified cardiac arrhythmias: Secondary | ICD-10-CM | POA: Diagnosis not present

## 2021-09-10 ENCOUNTER — Telehealth: Payer: Self-pay | Admitting: Family Medicine

## 2021-09-10 DIAGNOSIS — Z96651 Presence of right artificial knee joint: Secondary | ICD-10-CM | POA: Diagnosis not present

## 2021-09-10 DIAGNOSIS — K219 Gastro-esophageal reflux disease without esophagitis: Secondary | ICD-10-CM | POA: Diagnosis not present

## 2021-09-10 DIAGNOSIS — J301 Allergic rhinitis due to pollen: Secondary | ICD-10-CM | POA: Diagnosis not present

## 2021-09-10 DIAGNOSIS — E785 Hyperlipidemia, unspecified: Secondary | ICD-10-CM | POA: Diagnosis not present

## 2021-09-10 DIAGNOSIS — I10 Essential (primary) hypertension: Secondary | ICD-10-CM | POA: Diagnosis not present

## 2021-09-10 DIAGNOSIS — I498 Other specified cardiac arrhythmias: Secondary | ICD-10-CM | POA: Diagnosis not present

## 2021-09-10 DIAGNOSIS — M069 Rheumatoid arthritis, unspecified: Secondary | ICD-10-CM | POA: Diagnosis not present

## 2021-09-10 DIAGNOSIS — G4700 Insomnia, unspecified: Secondary | ICD-10-CM | POA: Diagnosis not present

## 2021-09-10 NOTE — Telephone Encounter (Signed)
I called kelsey of [facility] and informed her that the provider will be the attending for the patient and she understood.  Girard Koontz,cma  ?

## 2021-09-10 NOTE — Telephone Encounter (Signed)
Bryan [name] called from [facility] asking if [facility] will be the attending provider for pt being discharged from [facility]  ?

## 2021-09-15 DIAGNOSIS — Z9181 History of falling: Secondary | ICD-10-CM | POA: Diagnosis not present

## 2021-09-15 DIAGNOSIS — M069 Rheumatoid arthritis, unspecified: Secondary | ICD-10-CM | POA: Diagnosis not present

## 2021-09-15 DIAGNOSIS — J301 Allergic rhinitis due to pollen: Secondary | ICD-10-CM | POA: Diagnosis not present

## 2021-09-15 DIAGNOSIS — Z8781 Personal history of (healed) traumatic fracture: Secondary | ICD-10-CM | POA: Diagnosis not present

## 2021-09-15 DIAGNOSIS — Z96651 Presence of right artificial knee joint: Secondary | ICD-10-CM | POA: Diagnosis not present

## 2021-09-15 DIAGNOSIS — Z952 Presence of prosthetic heart valve: Secondary | ICD-10-CM | POA: Diagnosis not present

## 2021-09-15 DIAGNOSIS — Z7901 Long term (current) use of anticoagulants: Secondary | ICD-10-CM | POA: Diagnosis not present

## 2021-09-15 DIAGNOSIS — G4700 Insomnia, unspecified: Secondary | ICD-10-CM | POA: Diagnosis not present

## 2021-09-15 DIAGNOSIS — K219 Gastro-esophageal reflux disease without esophagitis: Secondary | ICD-10-CM | POA: Diagnosis not present

## 2021-09-15 DIAGNOSIS — D509 Iron deficiency anemia, unspecified: Secondary | ICD-10-CM | POA: Diagnosis not present

## 2021-09-15 DIAGNOSIS — Z471 Aftercare following joint replacement surgery: Secondary | ICD-10-CM | POA: Diagnosis not present

## 2021-09-15 DIAGNOSIS — Z4789 Encounter for other orthopedic aftercare: Secondary | ICD-10-CM | POA: Diagnosis not present

## 2021-09-15 DIAGNOSIS — E7800 Pure hypercholesterolemia, unspecified: Secondary | ICD-10-CM | POA: Diagnosis not present

## 2021-09-19 ENCOUNTER — Encounter: Payer: Self-pay | Admitting: Cardiovascular Disease

## 2021-09-21 ENCOUNTER — Other Ambulatory Visit: Payer: Self-pay | Admitting: Cardiovascular Disease

## 2021-09-21 ENCOUNTER — Other Ambulatory Visit: Payer: Self-pay | Admitting: Family Medicine

## 2021-09-22 DIAGNOSIS — Z471 Aftercare following joint replacement surgery: Secondary | ICD-10-CM | POA: Diagnosis not present

## 2021-09-22 DIAGNOSIS — K219 Gastro-esophageal reflux disease without esophagitis: Secondary | ICD-10-CM | POA: Diagnosis not present

## 2021-09-22 DIAGNOSIS — I10 Essential (primary) hypertension: Secondary | ICD-10-CM | POA: Diagnosis not present

## 2021-09-22 DIAGNOSIS — Z7901 Long term (current) use of anticoagulants: Secondary | ICD-10-CM | POA: Diagnosis not present

## 2021-09-22 DIAGNOSIS — Z952 Presence of prosthetic heart valve: Secondary | ICD-10-CM | POA: Diagnosis not present

## 2021-09-22 DIAGNOSIS — G4700 Insomnia, unspecified: Secondary | ICD-10-CM | POA: Diagnosis not present

## 2021-09-22 DIAGNOSIS — Z4789 Encounter for other orthopedic aftercare: Secondary | ICD-10-CM

## 2021-09-22 DIAGNOSIS — D509 Iron deficiency anemia, unspecified: Secondary | ICD-10-CM | POA: Diagnosis not present

## 2021-09-22 DIAGNOSIS — J301 Allergic rhinitis due to pollen: Secondary | ICD-10-CM | POA: Diagnosis not present

## 2021-09-22 DIAGNOSIS — Z9181 History of falling: Secondary | ICD-10-CM

## 2021-09-22 DIAGNOSIS — Z96651 Presence of right artificial knee joint: Secondary | ICD-10-CM | POA: Diagnosis not present

## 2021-09-22 DIAGNOSIS — M069 Rheumatoid arthritis, unspecified: Secondary | ICD-10-CM | POA: Diagnosis not present

## 2021-09-22 DIAGNOSIS — Z8781 Personal history of (healed) traumatic fracture: Secondary | ICD-10-CM | POA: Diagnosis not present

## 2021-09-22 DIAGNOSIS — E7800 Pure hypercholesterolemia, unspecified: Secondary | ICD-10-CM | POA: Diagnosis not present

## 2021-09-24 DIAGNOSIS — Z96651 Presence of right artificial knee joint: Secondary | ICD-10-CM | POA: Diagnosis not present

## 2021-09-24 DIAGNOSIS — Z952 Presence of prosthetic heart valve: Secondary | ICD-10-CM | POA: Diagnosis not present

## 2021-09-24 DIAGNOSIS — Z4789 Encounter for other orthopedic aftercare: Secondary | ICD-10-CM | POA: Diagnosis not present

## 2021-09-24 DIAGNOSIS — K219 Gastro-esophageal reflux disease without esophagitis: Secondary | ICD-10-CM | POA: Diagnosis not present

## 2021-09-24 DIAGNOSIS — Z471 Aftercare following joint replacement surgery: Secondary | ICD-10-CM | POA: Diagnosis not present

## 2021-09-24 DIAGNOSIS — M069 Rheumatoid arthritis, unspecified: Secondary | ICD-10-CM | POA: Diagnosis not present

## 2021-09-24 DIAGNOSIS — G4700 Insomnia, unspecified: Secondary | ICD-10-CM | POA: Diagnosis not present

## 2021-09-24 DIAGNOSIS — E7800 Pure hypercholesterolemia, unspecified: Secondary | ICD-10-CM | POA: Diagnosis not present

## 2021-09-24 DIAGNOSIS — J301 Allergic rhinitis due to pollen: Secondary | ICD-10-CM | POA: Diagnosis not present

## 2021-09-24 DIAGNOSIS — Z9181 History of falling: Secondary | ICD-10-CM | POA: Diagnosis not present

## 2021-09-24 DIAGNOSIS — Z7901 Long term (current) use of anticoagulants: Secondary | ICD-10-CM | POA: Diagnosis not present

## 2021-09-24 DIAGNOSIS — D509 Iron deficiency anemia, unspecified: Secondary | ICD-10-CM | POA: Diagnosis not present

## 2021-09-24 DIAGNOSIS — Z8781 Personal history of (healed) traumatic fracture: Secondary | ICD-10-CM | POA: Diagnosis not present

## 2021-09-29 ENCOUNTER — Encounter: Payer: Self-pay | Admitting: Family Medicine

## 2021-09-29 ENCOUNTER — Ambulatory Visit: Payer: Medicare (Managed Care) | Admitting: Family Medicine

## 2021-09-29 DIAGNOSIS — I10 Essential (primary) hypertension: Secondary | ICD-10-CM | POA: Diagnosis not present

## 2021-09-29 DIAGNOSIS — Z96659 Presence of unspecified artificial knee joint: Secondary | ICD-10-CM | POA: Insufficient documentation

## 2021-09-29 DIAGNOSIS — Z96651 Presence of right artificial knee joint: Secondary | ICD-10-CM | POA: Diagnosis not present

## 2021-09-29 NOTE — Assessment & Plan Note (Signed)
Adequately controlled.  The patient will continue lisinopril 20 mg once daily and amlodipine 5 mg daily. ?

## 2021-09-29 NOTE — Patient Instructions (Signed)
Nice to see you. ?Please continue to monitor your blood pressure.  If it trends up please let us know. ?Please make sure you follow-up with orthopedics as planned. ?

## 2021-09-29 NOTE — Assessment & Plan Note (Signed)
He seems to be progressing well after his knee replacement.  Discussed that it could take up to a year to fully recover from this.  He will continue PT and OT per his surgeon.  He will follow-up with his surgeon as planned. ?

## 2021-09-29 NOTE — Progress Notes (Signed)
?[name], MD ?Phone: [phone] ? ?Bryan [name] is a 81 y.o. male who presents today for follow-up. ? ?Status post right total knee replacement: This was on April 6.  He notes he has progressively been improving.  He notes there is still some pain and he had to get a refill on his hydrocodone.  He notes he follows up with orthopedics in the next couple of weeks.  He has been doing physical therapy and Occupational Therapy.  He notes right now his pain is a 4/10.  At the worst is a 6/10 when he lays down at night.  The hydrocodone brings it down to about a 2/10. ? ?Hypertension: His blood pressure has been 120-150/60-80s since his surgery.  He is taking amlodipine and lisinopril.  No chest pain or shortness of breath. ? ?Social History  ? ?Tobacco Use  ?Smoking Status Former  ? Packs/day: 1.00  ? Years: 32.00  ? Pack years: 32.00  ? Types: Cigarettes  ? Quit date: 07/05/1981  ? Years since quitting: 40.2  ?Smokeless Tobacco Never  ? ? ?Current Outpatient Medications on File Prior to Visit  ?Medication Sig Dispense Refill  ? amLODipine (NORVASC) 5 MG tablet TAKE 1 TABLET BY MOUTH DAILY. 90 tablet 2  ? atorvastatin (LIPITOR) 40 MG tablet TAKE 1 TABLET BY MOUTH DAILY 90 tablet 3  ? calcium carbonate (OS-CAL - DOSED IN MG OF ELEMENTAL CALCIUM) 1250 (500 Ca) MG tablet Take 1 tablet by mouth daily with lunch.    ? cetirizine (ZYRTEC) 10 MG tablet Take 10 mg by mouth daily.    ? diphenhydrAMINE (SOMINEX) 25 MG tablet Take by mouth.    ? donepezil (ARICEPT) 10 MG tablet Take 1 tablet by mouth at bedtime.    ? ELIQUIS 5 MG TABS tablet TAKE ONE TABLET BY MOUTH TWICE DAILY **NEEDS LAB WORK DONE BEFORE NEXT FILL** 180 tablet 0  ? fenofibrate 160 MG tablet TAKE ONE TABLET AT BEDTIME 90 tablet 3  ? FLUoxetine (PROZAC) 20 MG capsule TAKE 1 CAPSULE BY MOUTH ONCE DAILY 90 capsule 1  ? furosemide (LASIX) 40 MG tablet Take 1 tablet (40 mg total) by mouth every other day. 45 tablet 3  ? gabapentin (NEURONTIN) 300 MG  capsule TAKE 1 CAPSULE BY MOUTH 3 TIMES DAILY. 270 capsule 0  ? HYDROcodone-acetaminophen (NORCO/VICODIN) 5-325 MG tablet Take 1 tablet by mouth 4 (four) times daily as needed for moderate pain.    ? hydrocortisone cream 1 % Apply 1 application topically daily as needed for itching.    ? hydroxychloroquine (PLAQUENIL) 200 MG tablet TAKE 1 TABLET BY MOUTH DAILY (Patient taking differently: 3 (three) times a week.) 30 tablet 5  ? lisinopril (ZESTRIL) 20 MG tablet daily.    ? loperamide (IMODIUM) 2 MG capsule Take 4 mg by mouth in the morning and at bedtime.    ? Multiple Vitamins-Minerals (PRESERVISION AREDS 2) CAPS Take 1 capsule by mouth 2 (two) times daily.    ? omeprazole (PRILOSEC) 40 MG capsule TAKE 1 CAPSULE EVERY DAY 90 capsule 3  ? traZODone (DESYREL) 50 MG tablet TAKE 1 OR 2 TABLETS BY MOUTH AT BEDTIME AS NEEDED. 90 tablet 1  ? vitamin B-12 (CYANOCOBALAMIN) 1000 MCG tablet Take 1,000 mcg by mouth daily with lunch.     ? Vitamin D, Ergocalciferol, (DRISDOL) 1.25 MG (50000 UNIT) CAPS capsule Take 50,000 Units by mouth once a week.    ? vitamin E 180 MG (400 UNITS) capsule Take 800 Units by mouth  daily with lunch.    ? ?Current Facility-Administered Medications on File Prior to Visit  ?Medication Dose Route Frequency Provider Last Rate Last Admin  ? 0.9 %  sodium chloride infusion   Intravenous Continuous Lequita Asal, MD 10 mL/hr at 12/11/19 1123 New Bag at 12/11/19 1123  ? ? ? ?ROS see history of present illness ? ?Objective ? ?Physical Exam ?Vitals:  ? 09/29/21 1207  ?BP: 120/80  ?Pulse: 81  ?Temp: 98.4 ?F (36.9 ?C)  ?SpO2: 95%  ? ? ?BP Readings from Last 3 Encounters:  ?09/29/21 120/80  ?08/21/21 110/60  ?08/04/21 129/68  ? ?Wt Readings from Last 3 Encounters:  ?09/29/21 162 lb 6.4 oz (73.7 kg)  ?08/21/21 171 lb 12.8 oz (77.9 kg)  ?08/04/21 164 lb (74.4 kg)  ? ? ?Physical Exam ?Constitutional:   ?   General: He is not in acute distress. ?   Appearance: He is not diaphoretic.  ?Cardiovascular:  ?    Rate and Rhythm: Normal rate and regular rhythm.  ?   Heart sounds: Normal heart sounds.  ?Pulmonary:  ?   Effort: Pulmonary effort is normal.  ?   Breath sounds: Normal breath sounds.  ?Musculoskeletal:  ?   Right lower leg: No edema.  ?   Left lower leg: No edema.  ?   Comments: Right knee incision appears to be well-healing with no signs of infection  ?Skin: ?   General: Skin is warm and dry.  ?Neurological:  ?   Mental Status: He is alert.  ? ? ? ?Assessment/Plan: Please see individual problem list. ? ?Problem List Items Addressed This Visit   ? ? Essential hypertension  ?  Adequately controlled.  The patient will continue lisinopril 20 mg once daily and amlodipine 5 mg daily. ? ?  ?  ? Status post knee replacement  ?  He seems to be progressing well after his knee replacement.  Discussed that it could take up to a year to fully recover from this.  He will continue PT and OT per his surgeon.  He will follow-up with his surgeon as planned. ? ?  ?  ? ? ?Return in about 4 months (around 01/30/2022) for Follow-up PCP. ? ? ?[name], MD ?[place] ? ?

## 2021-09-30 ENCOUNTER — Telehealth: Payer: Self-pay

## 2021-09-30 NOTE — Telephone Encounter (Signed)
Bryan [name] called from [facility] to let Dr. Caryl Bis know that they will have to miss a visit this week with patient due to insurance authorization. ?

## 2021-09-30 NOTE — Telephone Encounter (Signed)
Noted  

## 2021-10-01 ENCOUNTER — Telehealth: Payer: Self-pay | Admitting: Cardiovascular Disease

## 2021-10-01 DIAGNOSIS — R413 Other amnesia: Secondary | ICD-10-CM | POA: Diagnosis not present

## 2021-10-01 DIAGNOSIS — R410 Disorientation, unspecified: Secondary | ICD-10-CM | POA: Diagnosis not present

## 2021-10-01 DIAGNOSIS — R404 Transient alteration of awareness: Secondary | ICD-10-CM | POA: Diagnosis not present

## 2021-10-01 DIAGNOSIS — G479 Sleep disorder, unspecified: Secondary | ICD-10-CM | POA: Diagnosis not present

## 2021-10-01 DIAGNOSIS — Z79899 Other long term (current) drug therapy: Secondary | ICD-10-CM

## 2021-10-01 NOTE — Telephone Encounter (Signed)
Needs orders for [facility] labs. Lmov for patient reminder  ?

## 2021-10-02 ENCOUNTER — Telehealth: Payer: Self-pay | Admitting: Family Medicine

## 2021-10-02 ENCOUNTER — Other Ambulatory Visit: Payer: Self-pay | Admitting: Family

## 2021-10-02 NOTE — Telephone Encounter (Signed)
Debra From [place] called in stating that Pt medication have a severity of drug interaction... Hilda Blades stated that medications (hydroxychloroquine (PLAQUENIL) 200 MG tablet), (FLUoxetine (PROZAC) 20 MG capsule), and (donepezil (ARICEPT) 10 MG tablet) interact with medication (traZODone (DESYREL) 50 MG tablet).... Hilda Blades stated that medication (donepezil (ARICEPT) 10 MG tablet) interact with medication (hydroxychloroquine (PLAQUENIL) 200 MG tablet).... Hilda Blades stated that Pt had right knee surgery in April.... Hilda Blades stated that wound completely healed but see extreme swallowing and heat on knee... Hilda Blades stated that she is concern about the knee... Hilda Blades stated that pt advised her that his knee been like that...  ?

## 2021-10-02 NOTE — Telephone Encounter (Signed)
[facility] lab orders placed for BMP and CBC.  ?

## 2021-10-05 NOTE — Telephone Encounter (Signed)
The specialist refilling that medication Plaquenil was Bryan [name] patient was advised that medication was inadvertently refilled but that Dr. Caryl Bis does not fil this medication refills canceled at pharmacy patient aware and he will request from rheumatology. Patient aware to return to ortho for any concerns of Knee. ?

## 2021-10-05 NOTE — Telephone Encounter (Signed)
Bryan [name], Noted. I do not note any interactions with the plaquenil and the aricept on uptodate. We will monitor the other interactions.  ? ?The knee appeared swollen though had not erythema when I saw him. He reported it was stable. If there is concern about his knee he needs to follow-up with his orthopedist.  ? ?Bryan [name], can you find out who was previously prescribing his plaquenil? His future refills will need to come from his specialist. Thanks.  ? ?Juliann Pulse, it looks like you refilled his plaquenil in February. I have not been prescribing this for him and I do not manage that medication. We will need to have this refilled by his specialist moving forward.  ? ? ?

## 2021-10-06 ENCOUNTER — Encounter: Payer: Self-pay | Admitting: Oncology

## 2021-10-06 ENCOUNTER — Inpatient Hospital Stay: Payer: Medicare (Managed Care) | Attending: Oncology

## 2021-10-06 ENCOUNTER — Inpatient Hospital Stay (HOSPITAL_BASED_OUTPATIENT_CLINIC_OR_DEPARTMENT_OTHER): Payer: Medicare (Managed Care) | Admitting: Oncology

## 2021-10-06 VITALS — BP 98/58 | HR 59 | Temp 98.2°F | Resp 16 | Wt 168.2 lb

## 2021-10-06 DIAGNOSIS — Z87891 Personal history of nicotine dependence: Secondary | ICD-10-CM | POA: Insufficient documentation

## 2021-10-06 DIAGNOSIS — D649 Anemia, unspecified: Secondary | ICD-10-CM

## 2021-10-06 DIAGNOSIS — Z96651 Presence of right artificial knee joint: Secondary | ICD-10-CM | POA: Diagnosis not present

## 2021-10-06 DIAGNOSIS — M069 Rheumatoid arthritis, unspecified: Secondary | ICD-10-CM | POA: Insufficient documentation

## 2021-10-06 DIAGNOSIS — D509 Iron deficiency anemia, unspecified: Secondary | ICD-10-CM

## 2021-10-06 LAB — CBC WITH DIFFERENTIAL/PLATELET
Abs Immature Granulocytes: 0.01 10*3/uL (ref 0.00–0.07)
Basophils Absolute: 0 10*3/uL (ref 0.0–0.1)
Basophils Relative: 1 %
Eosinophils Absolute: 0.1 10*3/uL (ref 0.0–0.5)
Eosinophils Relative: 2 %
HCT: 27.2 % — ABNORMAL LOW (ref 39.0–52.0)
Hemoglobin: 8.3 g/dL — ABNORMAL LOW (ref 13.0–17.0)
Immature Granulocytes: 0 %
Lymphocytes Relative: 11 %
Lymphs Abs: 0.5 10*3/uL — ABNORMAL LOW (ref 0.7–4.0)
MCH: 30.1 pg (ref 26.0–34.0)
MCHC: 30.5 g/dL (ref 30.0–36.0)
MCV: 98.6 fL (ref 80.0–100.0)
Monocytes Absolute: 0.4 10*3/uL (ref 0.1–1.0)
Monocytes Relative: 10 %
Neutro Abs: 3.3 10*3/uL (ref 1.7–7.7)
Neutrophils Relative %: 76 %
Platelets: 133 10*3/uL — ABNORMAL LOW (ref 150–400)
RBC: 2.76 MIL/uL — ABNORMAL LOW (ref 4.22–5.81)
RDW: 15 % (ref 11.5–15.5)
WBC: 4.3 10*3/uL (ref 4.0–10.5)
nRBC: 0 % (ref 0.0–0.2)

## 2021-10-06 LAB — FERRITIN: Ferritin: 207 ng/mL (ref 24–336)

## 2021-10-06 NOTE — Progress Notes (Signed)
Knee surgery back in April and needed a iron inffusion before being discharged.  ?

## 2021-10-06 NOTE — Progress Notes (Signed)
? ? ? ?Hematology/Oncology Consult note ?[facility]  ?[phone] [phone] ? ?Patient Care Team: ?[name], MD as PCP - General (Family Medicine) ?Minna Merritts, MD as PCP - Cardiology (Cardiology) ?Minna Merritts, MD as Consulting Physician (Cardiology) ?Emmaline Kluver., MD (Rheumatology) ?Kary Kos, MD as Consulting Physician (Neurosurgery) ?Jonathon Bellows, MD as Consulting Physician (Gastroenterology)  ? ?Name of the patient: Bryan [name]  ?[id number]  ?[birth year]  ? ?Date of visit: 10/06/21 ? 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 ?Chief complaint/ Reason for visit-routine follow-up of anemia ? ?Heme/Onc history: Patient is a 81 year old male with a past medical history of rheumatoid arthritis for which she has been on Plaquenil in.-Plaquenil was discontinued in 2018.Work-up on 07/19/2016 revealed a hematocrit of 31.7, hemoglobin 10.6, MCV 91, platelets 143,000, white count 3000 with an ANC of 1800.  Absolute lymphocyte count was 700 (low). Creatinine was 1.57.  LDH was 269 (98-192).  Normal labs included:  uric acid, folate, [place], SPEP, and copper.  Iron studies included a saturation of 11% (low) and a TIBC of 454 (high) c/w iron deficiency anemia.  Sed rate was 19.  Retic was 0.9% (low).  He is on oral iron with vitamin C. ?  ?Chest, abdomen, and pelvic CT on 08/09/2016 revealed no acute findings or clear evidence of malignancy in the chest, abdomen or pelvis.  There was asymmetric left rectal wall thickening possibly secondary to volume averaging with the prostate gland. ?  ?Bone marrow aspirate and biopsy on 11/04/2016 revealed a normocellular marrow (20%) with trilineage hematopoiesis and maturation.  There was mild megakaryocytic atypia.  Storage iron was present.  There were rare ringed sideroblasts.  Cytogenetics and FISH were normal (46, XY). ?  ?He has a history of iron  deficiency anemia and has required IV iron intermittently in the past  ?  ? ?Interval history-patient recently had a right knee replacement surgery in April 2023 and is gradually recovering from that.  Denies any new complaints at this time ? ?ECOG PS- 2 ?Pain scale- 2 ? ? ?Review of systems- Review of Systems  ?Constitutional:  Positive for malaise/fatigue. Negative for chills, fever and weight loss.  ?HENT:  Negative for congestion, ear discharge and nosebleeds.   ?Eyes:  Negative for blurred vision.  ?Respiratory:  Negative for cough, hemoptysis, sputum production, shortness of breath and wheezing.   ?Cardiovascular:  Negative for chest pain, palpitations, orthopnea and claudication.  ?Gastrointestinal:  Negative for abdominal pain, blood in stool, constipation, diarrhea, heartburn, melena, nausea and vomiting.  ?Genitourinary:  Negative for dysuria, flank pain, frequency, hematuria and urgency.  ?Musculoskeletal:  Negative for back pain, joint pain and myalgias.  ?Skin:  Negative for rash.  ?Neurological:  Negative for dizziness, tingling, focal weakness, seizures, weakness and headaches.  ?Endo/Heme/Allergies:  Does not bruise/bleed easily.  ?Psychiatric/Behavioral:  Negative for depression and suicidal ideas. The patient does not have insomnia.    ? ?Allergies  ?Allergen Reactions  ? Penicillins Hives, Swelling and Other (See Comments)  ?  SWELLING REACTION UNSPECIFIED ?PATIENT HAS TAKEN AMOXICILLIN ON MED HX FROM [facility] ?PATIENT HAS HAD A PCN REACTION WITH IMMEDIATE RASH, FACIAL/TONGUE/THROAT SWELLING, SOB, OR LIGHTHEADEDNESS WITH HYPOTENSION:  #  #  #  YES  #  #  #   ?Has patient had a PCN reaction causing severe rash involving mucus membranes or skin necrosis: No ?Has patient had a PCN reaction that required hospitalization No ?Has patient had  a PCN reaction occurring within the last 10 years: No  ? Demerol [Meperidine] Hives and Nausea And Vomiting  ? Other   ? ? ? ?Past Medical History:  ?Diagnosis Date  ?  Abnormal CT scan   ? Asymmetric left rectal wall thickening   ? Anemia   ? Anxiety   ? Arrhythmia   ? Basal cell carcinoma 04/28/2020  ? L forehead - ED&C 06/17/2020  ? Basal cell carcinoma 05/06/2021  ? left scalp postauricular ant, EDC  ? Basal cell carcinoma 05/06/2021  ? left scalp postauricular post, EDC  ? CHF (congestive heart failure) ([place])   ? Chronic lower back pain   ? Complication of anesthesia   ? Memory loss 09/2015  ? Coronary artery disease   ? Depression   ? GERD (gastroesophageal reflux disease)   ? Glaucoma   ? H/O thoracic aortic aneurysm repair   ? Headache   ? Heart murmur   ? History of being hospitalized   ? memory lose kidney funtion down blood pressure up  ? History of blood transfusion   ? "think he had one when he had heart valve OR" (10/14/2016); "none since" (10/19/2017)  ? History of chicken pox   ? Hyperlipidemia   ? Hypertension   ? Lumbar stenosis   ? Murmur   ? Neuropathy   ? Osteoarthritis   ? worse in feet and ankles  ? Paroxysmal A-fib ([place])   ? Poor short term memory   ? takes Aricept  ? Rheumatoid arthritis ([place])   ? "all over" (10/14/2016)  ? Schizophrenia ([place])   ? Seasonal allergies   ? Thoracic spine pain   ? Valvular heart disease   ? Wears glasses   ? reading  ? ? ? ?Past Surgical History:  ?Procedure Laterality Date  ? AORTIC VALVE REPLACEMENT  2007  ? [employer].  [place], [place]; "pig valve"  ? APPENDECTOMY  2010  ? BACK SURGERY    ? CARDIAC VALVE REPLACEMENT    ? CATARACT EXTRACTION W/ INTRAOCULAR LENS  IMPLANT, BILATERAL Bilateral 2012  ? COLONOSCOPY WITH PROPOFOL N/A 09/24/2016  ? Procedure: COLONOSCOPY WITH PROPOFOL;  Surgeon: Jonathon Bellows, MD;  Location[facility] ENDOSCOPY;  Service: Endoscopy;  Laterality: N/A;  ? ESOPHAGOGASTRODUODENOSCOPY (EGD) WITH PROPOFOL N/A 09/24/2016  ? Procedure: ESOPHAGOGASTRODUODENOSCOPY (EGD) WITH PROPOFOL;  Surgeon: Jonathon Bellows, MD;  Location: [facility] ENDOSCOPY;  Service: Endoscopy;  Laterality: N/A;  ? GIVENS CAPSULE STUDY N/A 09/24/2016  ?  Procedure: GIVENS CAPSULE STUDY;  Surgeon: Jonathon Bellows, MD;  Location: [facility] ENDOSCOPY;  Service: Endoscopy;  Laterality: N/A;  ? HAMMER TOE SURGERY Right 10/09/2015  ? Procedure: HAMMER TOE REPAIR WITH K-WIRE FIXATION RIGHT SECOND TOE;  Surgeon: Albertine Patricia, DPM;  Location: [place];  Service: Podiatry;  Laterality: Right;  WITH LOCAL  ? HARDWARE REMOVAL N/A 11/02/2019  ? Procedure: Removal of hardware thoracic spine;  Surgeon: Kary Kos, MD;  Location: [place];  Service: Neurosurgery;  Laterality: N/A;  posterior  ? INGUINAL HERNIA REPAIR Right 05/05/2018  ? Medium Bard PerFix plug.  Surgeon: Robert Bellow, MD;  Location: [facility] ORS;  Service: General;  Laterality: Right;  ? IR VERTEBROPLASTY CERV/THOR BX INC UNI/BIL INC/INJECT/IMAGING  10/27/2018  ? JOINT REPLACEMENT Left   ? left total knee  ? LAMINECTOMY WITH POSTERIOR LATERAL ARTHRODESIS LEVEL 3 N/A 09/28/2017  ? Procedure: LUMBAR  POSTEROR  FUSION REVISION - LUMBAR ONE -TWO, LUMBAR TWO -THREE,THREE-FOUR ,BILATERAL ARTHRODESIS REMOVAL LUMBAR THREE HARDWARE;  Surgeon:  Kary Kos, MD;  Location: [place];  Service: Neurosurgery;  Laterality: N/A;  ? LAMINECTOMY WITH POSTERIOR LATERAL ARTHRODESIS LEVEL 3 N/A 10/20/2017  ? Procedure: Revision fusion and removal of hardware Lumbar one, Pedicle screw fixation thoracic ten-lumbar two, thoracic nine-ten laminotomy, Posterior Lumbar Arthrodesis thoracic ten-lumbar two ;  Surgeon: Kary Kos, MD;  Location: [facility];  Service: Neurosurgery;  Laterality: N/A;  ? LAPAROSCOPIC CHOLECYSTECTOMY  2010  ? LUMBAR FUSION Right 06/08/2017  ? LUMBAR THREE-FOUR, LUMBAR FOUR-FIVE POSTEROLATERAL ARTHRODESIS WITH RIGHT LUMBAR FOUR-FIVE LAMINECTOMY/FORAMINOTOMY  ? LUMBAR LAMINECTOMY/DECOMPRESSION MICRODISCECTOMY Right 03/08/2016  ? Procedure: Laminectomy and Foraminotomy - Lumbar Five-Sacral One Right;  Surgeon: Kary Kos, MD;  Location: [place];  Service: Neurosurgery;  Laterality: Right;  Right  ? MULTIPLE TOOTH EXTRACTIONS    ? "3"   ? POSTERIOR LUMBAR FUSION  10/14/2016  ? L5-S1  ? REPLACEMENT TOTAL KNEE Left 2003  ? THORACIC AORTIC ANEURYSM REPAIR  2010  ? ? ?Social History  ? ?Socioeconomic History  ? Marital status: Widowed  ?  [place]

## 2021-10-12 ENCOUNTER — Telehealth: Payer: Self-pay | Admitting: Cardiovascular Disease

## 2021-10-12 DIAGNOSIS — E7800 Pure hypercholesterolemia, unspecified: Secondary | ICD-10-CM | POA: Diagnosis not present

## 2021-10-12 DIAGNOSIS — D509 Iron deficiency anemia, unspecified: Secondary | ICD-10-CM | POA: Diagnosis not present

## 2021-10-12 DIAGNOSIS — I10 Essential (primary) hypertension: Secondary | ICD-10-CM | POA: Diagnosis not present

## 2021-10-12 DIAGNOSIS — Z952 Presence of prosthetic heart valve: Secondary | ICD-10-CM | POA: Diagnosis not present

## 2021-10-12 DIAGNOSIS — K219 Gastro-esophageal reflux disease without esophagitis: Secondary | ICD-10-CM | POA: Diagnosis not present

## 2021-10-12 DIAGNOSIS — Z7901 Long term (current) use of anticoagulants: Secondary | ICD-10-CM | POA: Diagnosis not present

## 2021-10-12 DIAGNOSIS — G4700 Insomnia, unspecified: Secondary | ICD-10-CM | POA: Diagnosis not present

## 2021-10-12 DIAGNOSIS — Z9181 History of falling: Secondary | ICD-10-CM

## 2021-10-12 DIAGNOSIS — Z8781 Personal history of (healed) traumatic fracture: Secondary | ICD-10-CM | POA: Diagnosis not present

## 2021-10-12 DIAGNOSIS — Z96651 Presence of right artificial knee joint: Secondary | ICD-10-CM | POA: Diagnosis not present

## 2021-10-12 DIAGNOSIS — Z4789 Encounter for other orthopedic aftercare: Secondary | ICD-10-CM

## 2021-10-12 DIAGNOSIS — J301 Allergic rhinitis due to pollen: Secondary | ICD-10-CM | POA: Diagnosis not present

## 2021-10-12 DIAGNOSIS — M069 Rheumatoid arthritis, unspecified: Secondary | ICD-10-CM | POA: Diagnosis not present

## 2021-10-12 DIAGNOSIS — Z471 Aftercare following joint replacement surgery: Secondary | ICD-10-CM | POA: Diagnosis not present

## 2021-10-12 NOTE — Telephone Encounter (Signed)
Reviewed the patient's chart.  He was actually returning a call to Meriam Sprague who had attempted to reach out to him earlier to day to remind his of BMP & CBC that is due for his Eliquis. Confirmed with Anderson Malta that she did speak with the patient and advise him to report to the [facility] for lab work as he is now due for this.

## 2021-10-12 NOTE — Telephone Encounter (Signed)
Attempted to contact patient as a reminder.

## 2021-10-12 NOTE — Telephone Encounter (Signed)
Pt returning call. Please advise. °

## 2021-10-12 NOTE — Telephone Encounter (Signed)
Patient aware will go Thursday at 10

## 2021-10-13 ENCOUNTER — Telehealth: Payer: Self-pay | Admitting: Family Medicine

## 2021-10-13 NOTE — Telephone Encounter (Signed)
Bryan [name] from [facility], [phone] called to let provider know that patient is requesting to be released from home healthcare, nursing.

## 2021-10-13 NOTE — Telephone Encounter (Signed)
Noted. Do they need anything from [place] regarding this?

## 2021-10-14 NOTE — Telephone Encounter (Signed)
No.  Just a fyi.  Bryan [name],cma

## 2021-10-22 ENCOUNTER — Ambulatory Visit: Payer: Medicare Other | Admitting: Dermatology

## 2021-10-23 ENCOUNTER — Encounter: Payer: Self-pay | Admitting: Oncology

## 2021-10-23 NOTE — Telephone Encounter (Signed)
Move up his labs to next week

## 2021-10-26 ENCOUNTER — Inpatient Hospital Stay: Payer: Medicare (Managed Care) | Attending: Oncology

## 2021-10-26 DIAGNOSIS — D61818 Other pancytopenia: Secondary | ICD-10-CM | POA: Insufficient documentation

## 2021-10-26 DIAGNOSIS — M069 Rheumatoid arthritis, unspecified: Secondary | ICD-10-CM | POA: Diagnosis not present

## 2021-10-26 DIAGNOSIS — Z79899 Other long term (current) drug therapy: Secondary | ICD-10-CM | POA: Diagnosis not present

## 2021-10-26 DIAGNOSIS — D649 Anemia, unspecified: Secondary | ICD-10-CM

## 2021-10-26 DIAGNOSIS — D509 Iron deficiency anemia, unspecified: Secondary | ICD-10-CM | POA: Diagnosis not present

## 2021-10-26 LAB — CBC
HCT: 25.9 % — ABNORMAL LOW (ref 39.0–52.0)
Hemoglobin: 8.2 g/dL — ABNORMAL LOW (ref 13.0–17.0)
MCH: 29.5 pg (ref 26.0–34.0)
MCHC: 31.7 g/dL (ref 30.0–36.0)
MCV: 93.2 fL (ref 80.0–100.0)
Platelets: 113 10*3/uL — ABNORMAL LOW (ref 150–400)
RBC: 2.78 MIL/uL — ABNORMAL LOW (ref 4.22–5.81)
RDW: 14.7 % (ref 11.5–15.5)
WBC: 3.1 10*3/uL — ABNORMAL LOW (ref 4.0–10.5)
nRBC: 0 % (ref 0.0–0.2)

## 2021-10-26 LAB — RETICULOCYTES
Immature Retic Fract: 13.8 % (ref 2.3–15.9)
RBC.: 2.79 MIL/uL — ABNORMAL LOW (ref 4.22–5.81)
Retic Count, Absolute: 65 10*3/uL (ref 19.0–186.0)
Retic Ct Pct: 2.3 % (ref 0.4–3.1)

## 2021-10-26 LAB — FERRITIN: Ferritin: 162 ng/mL (ref 24–336)

## 2021-10-27 LAB — HAPTOGLOBIN: Haptoglobin: 190 mg/dL (ref 38–329)

## 2021-10-29 ENCOUNTER — Telehealth: Payer: Self-pay | Admitting: *Deleted

## 2021-10-29 NOTE — Telephone Encounter (Signed)
Called and left voicemail about his labs. The wbc 3.1 and like is lower thatn last time but nothing to do for now., the hgb was 8.2 and his last one 3 weeks ago was 8.3 so no big change there, the ferritin was 162 which is a good number and then as soon as I hug up the pt. Called me back. He felt that he is weak and tired. I offered to get him in with [facility] to see NP. He said that he only wants to see MD and I told him that she is on vacation. He wanted to know if he could get a transfusion and I told him that we usually give blood transfusions for people that are a 7 on the hemoglobin but there are exceptions  depending on the situation. I told him that we are now have day 1 to get hgb, and do a type and screen and if you need the blood then you usually will get next day or 2. He told me that I can get him appt next week and then see what to do. I told him I will have to get appt from scheduler for this next week. He said call him when I get the appt done.

## 2021-10-29 NOTE — Telephone Encounter (Signed)
I called the pt today about his labs. See note from today

## 2021-11-02 ENCOUNTER — Other Ambulatory Visit: Payer: Self-pay | Admitting: Urology

## 2021-11-02 DIAGNOSIS — M546 Pain in thoracic spine: Secondary | ICD-10-CM | POA: Diagnosis not present

## 2021-11-02 DIAGNOSIS — M5440 Lumbago with sciatica, unspecified side: Secondary | ICD-10-CM | POA: Diagnosis not present

## 2021-11-02 DIAGNOSIS — F1120 Opioid dependence, uncomplicated: Secondary | ICD-10-CM | POA: Diagnosis not present

## 2021-11-02 NOTE — Telephone Encounter (Signed)
Pt was called and got a appt to see dr Janese Banks

## 2021-11-06 ENCOUNTER — Encounter: Payer: Self-pay | Admitting: Oncology

## 2021-11-06 ENCOUNTER — Inpatient Hospital Stay: Payer: Medicare (Managed Care)

## 2021-11-06 ENCOUNTER — Inpatient Hospital Stay (HOSPITAL_BASED_OUTPATIENT_CLINIC_OR_DEPARTMENT_OTHER): Payer: Medicare (Managed Care) | Admitting: Oncology

## 2021-11-06 VITALS — BP 143/72 | HR 62 | Resp 18 | Wt 163.4 lb

## 2021-11-06 DIAGNOSIS — D509 Iron deficiency anemia, unspecified: Secondary | ICD-10-CM

## 2021-11-06 DIAGNOSIS — D61818 Other pancytopenia: Secondary | ICD-10-CM | POA: Diagnosis not present

## 2021-11-06 MED ORDER — SODIUM CHLORIDE 0.9 % IV SOLN: 200.0000 mg | INTRAVENOUS | Status: DC

## 2021-11-06 MED ORDER — IRON SUCROSE 20 MG/ML IV SOLN
200.0000 mg | Freq: Once | INTRAVENOUS | Status: AC
  Administered 2021-11-06: 200 mg via INTRAVENOUS

## 2021-11-06 MED ORDER — SODIUM CHLORIDE 0.9 % IV SOLN
Freq: Once | INTRAVENOUS | Status: AC
  Filled 2021-11-06: qty 250

## 2021-11-06 NOTE — Progress Notes (Unsigned)
Pt states that he feels tired and fatigued all of the time.

## 2021-11-08 ENCOUNTER — Encounter: Payer: Self-pay | Admitting: Hematology and Oncology

## 2021-11-08 NOTE — Progress Notes (Signed)
Hematology/Oncology Consult note [facility]  Telephone:([phone] [phone]  Patient Care Team: [name], MD as PCP - General (Family Medicine) [name] Kathlene November, MD as PCP - Cardiology (Cardiology) Minna Merritts, MD as Consulting Physician (Cardiology) Emmaline Kluver., MD (Rheumatology) Kary Kos, MD as Consulting Physician (Neurosurgery) Jonathon Bellows, MD as Consulting Physician (Gastroenterology)   Name of the patient: Bryan [name]  [id number]  Oct 30, 1940   Date of visit: 11/08/21  Diagnosis- iron deficiency anemia mild pancytopenia 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  Absolute lymphocyte count was 700 (low). Creatinine was 1.57.  LDH was 269 (98-192).  Normal labs included:  uric acid, folate, [place], SPEP, and copper.  Iron studies included a saturation of 11% (low) and a TIBC of 454 (high) c/w iron deficiency anemia.  Sed rate was 19.  Retic was 0.9% (low).  He is on oral iron with vitamin C.   Chest, abdomen, and pelvic CT on 08/09/2016 revealed no acute findings or clear evidence of malignancy in the chest, abdomen or pelvis.  There was asymmetric left rectal wall thickening possibly secondary to volume averaging with the prostate gland.   Bone marrow aspirate and biopsy on 11/04/2016 revealed a normocellular marrow (20%) with trilineage hematopoiesis and maturation.  There was mild megakaryocytic atypia.  Storage iron was present.  There were rare ringed sideroblasts.  Cytogenetics and FISH were normal (46, XY).   He has a history of iron  deficiency anemia and has required IV iron intermittently in the past   Interval history-patient recently underwent right knee surgery.  Since then he reports significant fatigue.  Denies any significant blood loss in his stool or urine  ECOG PS- 2 Pain scale- 3   Review of systems- Review of Systems  Constitutional:  Positive for malaise/fatigue. Negative for chills, fever and weight loss.  HENT:  Negative for congestion, ear discharge and nosebleeds.   Eyes:  Negative for blurred vision.  Respiratory:  Negative for cough, hemoptysis, sputum production, shortness of breath and wheezing.   Cardiovascular:  Negative for chest pain, palpitations, orthopnea and claudication.  Gastrointestinal:  Negative for abdominal pain, blood in stool, constipation, diarrhea, heartburn, melena, nausea and vomiting.  Genitourinary:  Negative for dysuria, flank pain, frequency, hematuria and urgency.  Musculoskeletal:  Negative for back pain, joint pain and myalgias.  Skin:  Negative for rash.  Neurological:  Negative for dizziness, tingling, focal weakness, seizures, weakness and headaches.  Endo/Heme/Allergies:  Does not bruise/bleed easily.  Psychiatric/Behavioral:  Negative for depression and suicidal ideas. The patient does not have insomnia.       Allergies  Allergen Reactions   Penicillins Hives, Swelling and Other (See Comments)    SWELLING REACTION UNSPECIFIED PATIENT HAS TAKEN AMOXICILLIN ON MED HX FROM [facility] PATIENT HAS HAD A PCN REACTION WITH IMMEDIATE RASH, FACIAL/TONGUE/THROAT SWELLING, SOB, OR LIGHTHEADEDNESS WITH HYPOTENSION:  #  #  #  YES  #  #  #   Has patient had a PCN reaction causing severe rash involving mucus membranes or skin necrosis: No Has patient had a PCN reaction that required hospitalization No Has patient had  a PCN reaction occurring within the last 10 years: No   Demerol [Meperidine] Hives and Nausea And Vomiting   Other      Past Medical History:  Diagnosis Date    Abnormal CT scan    Asymmetric left rectal wall thickening    Anemia    Anxiety    Arrhythmia    Basal cell carcinoma 04/28/2020   L forehead - ED&C 06/17/2020   Basal cell carcinoma 05/06/2021   left scalp postauricular ant, [facility]   Basal cell carcinoma 05/06/2021   left scalp postauricular post, [facility]   CHF (congestive heart failure) (HCC)    Chronic lower back pain    Complication of anesthesia    Memory loss 09/2015   Coronary artery disease    Depression    GERD (gastroesophageal reflux disease)    Glaucoma    H/O thoracic aortic aneurysm repair    Headache    Heart murmur    History of being hospitalized    memory lose kidney funtion down blood pressure up   History of blood transfusion    "think he had one when he had heart valve OR" (10/14/2016); "none since" (10/19/2017)   History of chicken pox    Hyperlipidemia    Hypertension    Lumbar stenosis    Murmur    Neuropathy    Osteoarthritis    worse in feet and ankles   Paroxysmal A-fib ([place])    Poor short term memory    takes Aricept   Rheumatoid arthritis ([place])    "all over" (10/14/2016)   Schizophrenia ([place])    Seasonal allergies    Thoracic spine pain    Valvular heart disease    Wears glasses    reading     Past Surgical History:  Procedure Laterality Date   AORTIC VALVE REPLACEMENT  2007   [facility].  [place], [place]; "pig valve"   APPENDECTOMY  2010   BACK SURGERY     CARDIAC VALVE REPLACEMENT     CATARACT EXTRACTION W/ INTRAOCULAR LENS  IMPLANT, BILATERAL Bilateral 2012   COLONOSCOPY WITH PROPOFOL N/A 09/24/2016   Procedure: COLONOSCOPY WITH PROPOFOL;  Surgeon: Jonathon Bellows, MD;  Location: [facility] ENDOSCOPY;  Service: Endoscopy;  Laterality: N/A;   ESOPHAGOGASTRODUODENOSCOPY (EGD) WITH PROPOFOL N/A 09/24/2016   Procedure: ESOPHAGOGASTRODUODENOSCOPY (EGD) WITH PROPOFOL;  Surgeon: Jonathon Bellows, MD;  Location: [facility] ENDOSCOPY;  Service: Endoscopy;  Laterality: N/A;   GIVENS CAPSULE STUDY N/A 09/24/2016    Procedure: GIVENS CAPSULE STUDY;  Surgeon: Jonathon Bellows, MD;  Location: [facility] ENDOSCOPY;  Service: Endoscopy;  Laterality: N/A;   HAMMER TOE SURGERY Right 10/09/2015   Procedure: HAMMER TOE REPAIR WITH K-WIRE FIXATION RIGHT SECOND TOE;  Surgeon: Albertine Patricia, DPM;  Location: [place];  Service: Podiatry;  Laterality: Right;  WITH LOCAL   HARDWARE REMOVAL N/A 11/02/2019   Procedure: Removal of hardware thoracic spine;  Surgeon: Kary Kos, MD;  Location: [place];  Service: Neurosurgery;  Laterality: N/A;  posterior   INGUINAL HERNIA REPAIR Right 05/05/2018   Medium Bard PerFix plug.  Surgeon: Robert Bellow, MD;  Location: [facility] ORS;  Service: General;  Laterality: Right;   IR VERTEBROPLASTY CERV/THOR BX INC UNI/BIL INC/INJECT/IMAGING  10/27/2018   JOINT REPLACEMENT Left    left total knee   LAMINECTOMY WITH POSTERIOR LATERAL ARTHRODESIS LEVEL 3 N/A 09/28/2017   Procedure: LUMBAR  POSTEROR  FUSION REVISION - LUMBAR ONE -TWO, LUMBAR TWO -THREE,THREE-FOUR ,BILATERAL ARTHRODESIS REMOVAL LUMBAR THREE HARDWARE;  Surgeon:  Kary Kos, MD;  Location: [place];  Service: Neurosurgery;  Laterality: N/A;   LAMINECTOMY WITH POSTERIOR LATERAL ARTHRODESIS LEVEL 3 N/A 10/20/2017   Procedure: Revision fusion and removal of hardware Lumbar one, Pedicle screw fixation thoracic ten-lumbar two, thoracic nine-ten laminotomy, Posterior Lumbar Arthrodesis thoracic ten-lumbar two ;  Surgeon: Kary Kos, MD;  Location: [place];  Service: Neurosurgery;  Laterality: N/A;   LAPAROSCOPIC CHOLECYSTECTOMY  2010   LUMBAR FUSION Right 06/08/2017   LUMBAR THREE-FOUR, LUMBAR FOUR-FIVE POSTEROLATERAL ARTHRODESIS WITH RIGHT LUMBAR FOUR-FIVE LAMINECTOMY/FORAMINOTOMY   LUMBAR LAMINECTOMY/DECOMPRESSION MICRODISCECTOMY Right 03/08/2016   Procedure: Laminectomy and Foraminotomy - Lumbar Five-Sacral One Right;  Surgeon: Kary Kos, MD;  Location: [place];  Service: Neurosurgery;  Laterality: Right;  Right   MULTIPLE TOOTH EXTRACTIONS     "3"    POSTERIOR LUMBAR FUSION  10/14/2016   L5-S1   REPLACEMENT TOTAL KNEE Left 2003   THORACIC AORTIC ANEURYSM REPAIR  2010    Social History   Socioeconomic History   Marital status: Widowed    Spouse name: Not on file   Number of children: Not on file   Years of education: Not on file   Highest education level: Not on file  Occupational History   Occupation: retired  Tobacco Use   Smoking status: Former    Packs/day: 1.00    Years: 32.00    Total pack years: 32.00    Types: Cigarettes    Quit date: 07/05/1981    Years since quitting: 40.3   Smokeless tobacco: Never  Vaping Use   Vaping Use: Never used  Substance and Sexual Activity   Alcohol use: Yes    Comment: "glass of wine/month; if that"   Drug use: Never   Sexual activity: Not Currently  Other Topics Concern   Not on file  Social History Narrative   Not on file   Social Determinants of Health   Financial Resource Strain: Low Risk  (02/06/2021)   Overall Financial Resource Strain (CARDIA)    Difficulty of Paying Living Expenses: Not hard at all  Food Insecurity: No Food Insecurity (02/06/2021)   Hunger Vital Sign    Worried About Running Out of Food in the Last Year: Never true    [place] in the Last Year: Never true  Transportation Needs: No Transportation Needs (02/06/2021)   PRAPARE - Hydrologist (Medical): No    Lack of Transportation (Non-Medical): No  Physical Activity: Insufficiently Active (02/06/2021)   Exercise Vital Sign    Days of Exercise per Week: 2 days    Minutes of Exercise per Session: 30 min  Stress: No Stress Concern Present (02/06/2021)   [place]    Feeling of Stress : Not at all  Social Connections: Unknown (02/06/2021)   Social Connection and Isolation Panel [NHANES]    Frequency of Communication with Friends and Family: More than three times a week    Frequency of Social Gatherings  with Friends and Family: More than three times a week    Attends Religious Services: More than 4 times per year    Active Member of [employer] or Organizations: Yes    Attends Archivist Meetings: More than 4 times per year    Marital Status: Not on file  Intimate Partner Violence: Not At Risk (02/06/2021)   Humiliation, Afraid, Rape, and Kick questionnaire    Fear of Current or Ex-Partner: No  Emotionally Abused: No    Physically Abused: No    Sexually Abused: No    Family History  Problem Relation Age of Onset   Heart failure Mother    Hypertension Mother    Asthma Mother    Osteoporosis Mother    Heart attack Father 39       MI   Hypertension Sister    Prostate cancer Neg Hx    Bladder Cancer Neg Hx    Kidney cancer Neg Hx    Colon cancer Neg Hx      Current Outpatient Medications:    amLODipine (NORVASC) 5 MG tablet, TAKE 1 TABLET BY MOUTH DAILY., Disp: 90 tablet, Rfl: 2   atorvastatin (LIPITOR) 40 MG tablet, TAKE 1 TABLET BY MOUTH DAILY, Disp: 90 tablet, Rfl: 3   calcium carbonate (OS-CAL - DOSED IN MG OF ELEMENTAL CALCIUM) 1250 (500 Ca) MG tablet, Take 1 tablet by mouth daily with lunch., Disp: , Rfl:    cetirizine (ZYRTEC) 10 MG tablet, Take 10 mg by mouth daily., Disp: , Rfl:    donepezil (ARICEPT) 10 MG tablet, Take 1 tablet by mouth at bedtime., Disp: , Rfl:    ELIQUIS 5 MG TABS tablet, TAKE ONE TABLET BY MOUTH TWICE DAILY **NEEDS LAB WORK DONE BEFORE NEXT FILL**, Disp: 180 tablet, Rfl: 0   fenofibrate 160 MG tablet, TAKE ONE TABLET AT BEDTIME, Disp: 90 tablet, Rfl: 3   ferrous sulfate 325 (65 FE) MG tablet, Take by mouth., Disp: , Rfl:    FLUoxetine (PROZAC) 20 MG capsule, TAKE 1 CAPSULE BY MOUTH ONCE DAILY, Disp: 90 capsule, Rfl: 1   gabapentin (NEURONTIN) 300 MG capsule, TAKE 1 CAPSULE BY MOUTH 3 TIMES DAILY., Disp: 270 capsule, Rfl: 0   HYDROcodone-acetaminophen (NORCO/VICODIN) 5-325 MG tablet, Take 1 tablet by mouth 4 (four) times daily as needed for  moderate pain., Disp: , Rfl:    hydrocortisone cream 1 %, Apply 1 application topically daily as needed for itching., Disp: , Rfl:    hydroxychloroquine (PLAQUENIL) 200 MG tablet, TAKE 1 TABLET BY MOUTH DAILY (Patient taking differently: 3 (three) times a week.), Disp: 30 tablet, Rfl: 5   lisinopril (ZESTRIL) 20 MG tablet, daily., Disp: , Rfl:    loperamide (IMODIUM) 2 MG capsule, Take 4 mg by mouth in the morning and at bedtime., Disp: , Rfl:    Multiple Vitamins-Minerals (PRESERVISION AREDS 2) CAPS, Take 1 capsule by mouth 2 (two) times daily., Disp: , Rfl:    omeprazole (PRILOSEC) 40 MG capsule, TAKE 1 CAPSULE EVERY DAY, Disp: 90 capsule, Rfl: 3   traZODone (DESYREL) 50 MG tablet, TAKE 1 OR 2 TABLETS BY MOUTH AT BEDTIME AS NEEDED., Disp: 90 tablet, Rfl: 1   vitamin B-12 (CYANOCOBALAMIN) 1000 MCG tablet, Take 1,000 mcg by mouth daily with lunch. , Disp: , Rfl:    Vitamin D, Ergocalciferol, (DRISDOL) 1.25 MG (50000 UNIT) CAPS capsule, Take 50,000 Units by mouth once a week., Disp: , Rfl:    vitamin E 180 MG (400 UNITS) capsule, Take 800 Units by mouth daily with lunch., Disp: , Rfl:    diphenhydrAMINE (SOMINEX) 25 MG tablet, Take by mouth. (Patient not taking: Reported on 11/06/2021), Disp: , Rfl:    furosemide (LASIX) 40 MG tablet, Take 1 tablet (40 mg total) by mouth every other day. (Patient not taking: Reported on 10/06/2021), Disp: 45 tablet, Rfl: 3 No current facility-administered medications for this visit.  Facility-Administered Medications Ordered in Other Visits:    0.9 %  sodium  chloride infusion, , Intravenous, Continuous, Corcoran, Melissa C, MD, Last Rate: 10 mL/hr at 12/11/19 1123, New Bag at 12/11/19 1123  Physical exam:  Vitals:   11/06/21 1333  BP: (!) 143/72  Pulse: 62  Resp: 18  SpO2: 95%  Weight: 163 lb 6.4 oz (74.1 kg)   Physical Exam Constitutional:      Comments: Sitting in a wheelchair.  Brace  in place over the right knee  Cardiovascular:     Rate and Rhythm:  Normal rate and regular rhythm.     Heart sounds: Normal heart sounds.  Pulmonary:     Effort: Pulmonary effort is normal.     Breath sounds: Normal breath sounds.  Abdominal:     General: Bowel sounds are normal.     Palpations: Abdomen is soft.  Skin:    General: Skin is warm and dry.  Neurological:     Mental Status: He is alert and oriented to person, place, and time.         Latest Ref Rng & Units 08/26/2020    1:59 PM  CMP  Glucose 65 - 99 mg/dL 99   BUN 8 - 27 mg/dL 20   Creatinine 0.76 - 1.27 mg/dL 0.92   Sodium 134 - 144 mmol/L 137   Potassium 3.5 - 5.2 mmol/L 5.0   Chloride 96 - 106 mmol/L 102   CO2 20 - 29 mmol/L 21   Calcium 8.6 - 10.2 mg/dL 9.2   Total Protein 6.0 - 8.5 g/dL 6.6   Total Bilirubin 0.0 - 1.2 mg/dL 0.2   Alkaline Phos 44 - 121 IU/L 92   AST 0 - 40 IU/L 14   ALT 0 - 44 IU/L 12       Latest Ref Rng & Units 10/26/2021    2:40 PM  CBC  WBC 4.0 - 10.5 K/uL 3.1   Hemoglobin 13.0 - 17.0 g/dL 8.2   Hematocrit 39.0 - 52.0 % 25.9   Platelets 150 - 400 K/uL 113      Assessment and plan- Patient is a 81 y.o. male with mild pancytopenia likely secondary to rheumatoid arthritis and iron deficiency anemia here for routine follow-up  Patient's hemoglobin typically ranges between 10-11.  Presently it has dropped down to8.2 likely secondary to his known recent knee surgery.  His ferritin level is 162 but given that his iron saturation back in February was 14% and he is has ongoing symptoms of fatigue I will proceed with IV iron at this time.  I suspect his hemoglobin will improve back to his baseline regardless of iron in the next 1 to 2 months.  We will repeat CBC ferritin and iron studies and B12 levels in 2 months followed by in person or video visit with me   Visit Diagnosis 1. Iron deficiency anemia, unspecified iron deficiency anemia type   2. Other pancytopenia ([place])      Dr. Randa Evens, MD, MPH [facility] [phone] 11/08/2021 6:50 PM

## 2021-11-10 ENCOUNTER — Other Ambulatory Visit: Payer: Self-pay | Admitting: Family

## 2021-11-16 ENCOUNTER — Other Ambulatory Visit: Payer: Self-pay | Admitting: Oncology

## 2021-11-16 ENCOUNTER — Inpatient Hospital Stay: Payer: Medicare (Managed Care)

## 2021-11-16 VITALS — BP 118/54 | HR 54 | Temp 96.8°F | Resp 17

## 2021-11-16 DIAGNOSIS — M898X9 Other specified disorders of bone, unspecified site: Secondary | ICD-10-CM | POA: Diagnosis not present

## 2021-11-16 DIAGNOSIS — M2141 Flat foot [pes planus] (acquired), right foot: Secondary | ICD-10-CM | POA: Diagnosis not present

## 2021-11-16 DIAGNOSIS — B351 Tinea unguium: Secondary | ICD-10-CM | POA: Diagnosis not present

## 2021-11-16 DIAGNOSIS — D509 Iron deficiency anemia, unspecified: Secondary | ICD-10-CM

## 2021-11-16 DIAGNOSIS — M2142 Flat foot [pes planus] (acquired), left foot: Secondary | ICD-10-CM | POA: Diagnosis not present

## 2021-11-16 DIAGNOSIS — L600 Ingrowing nail: Secondary | ICD-10-CM | POA: Diagnosis not present

## 2021-11-16 DIAGNOSIS — D649 Anemia, unspecified: Secondary | ICD-10-CM

## 2021-11-16 MED ORDER — SODIUM CHLORIDE 0.9 % IV SOLN
INTRAVENOUS | Status: DC
  Filled 2021-11-16: qty 250

## 2021-11-16 MED ORDER — SODIUM CHLORIDE 0.9 % IV SOLN: 200.0000 mg | Freq: Once | INTRAVENOUS | Status: DC

## 2021-11-16 MED ORDER — IRON SUCROSE 20 MG/ML IV SOLN
200.0000 mg | Freq: Once | INTRAVENOUS | Status: AC
  Administered 2021-11-16: 200 mg via INTRAVENOUS
  Filled 2021-11-16: qty 10

## 2021-11-18 ENCOUNTER — Inpatient Hospital Stay: Payer: Medicare (Managed Care)

## 2021-11-18 VITALS — BP 132/75 | HR 64 | Temp 97.8°F | Resp 16

## 2021-11-18 DIAGNOSIS — D509 Iron deficiency anemia, unspecified: Secondary | ICD-10-CM | POA: Diagnosis not present

## 2021-11-18 MED ORDER — IRON SUCROSE 20 MG/ML IV SOLN
200.0000 mg | Freq: Once | INTRAVENOUS | Status: AC
  Administered 2021-11-18: 200 mg via INTRAVENOUS
  Filled 2021-11-18: qty 10

## 2021-11-18 MED ORDER — SODIUM CHLORIDE 0.9 % IV SOLN
Freq: Once | INTRAVENOUS | Status: AC
  Filled 2021-11-18: qty 250

## 2021-11-18 MED ORDER — SODIUM CHLORIDE 0.9 % IV SOLN: 200.0000 mg | Freq: Once | INTRAVENOUS | Status: DC

## 2021-11-20 ENCOUNTER — Inpatient Hospital Stay: Payer: Medicare (Managed Care)

## 2021-11-20 VITALS — BP 131/70 | HR 71 | Temp 99.2°F | Resp 18

## 2021-11-20 DIAGNOSIS — D509 Iron deficiency anemia, unspecified: Secondary | ICD-10-CM | POA: Diagnosis not present

## 2021-11-20 MED ORDER — SODIUM CHLORIDE 0.9 % IV SOLN: 200.0000 mg | Freq: Once | INTRAVENOUS | Status: DC

## 2021-11-20 MED ORDER — IRON SUCROSE 20 MG/ML IV SOLN
200.0000 mg | Freq: Once | INTRAVENOUS | Status: AC
  Administered 2021-11-20: 200 mg via INTRAVENOUS
  Filled 2021-11-20: qty 10

## 2021-11-20 MED ORDER — SODIUM CHLORIDE 0.9 % IV SOLN
Freq: Once | INTRAVENOUS | Status: AC
  Filled 2021-11-20: qty 250

## 2021-11-20 NOTE — Patient Instructions (Signed)
[facility] [place]  Discharge Instructions: Thank you for choosing [facility] to provide your oncology and hematology care.  If you have a lab appointment with the Cancer Center, please go directly to the Cancer Center and check in at the registration area.  Wear comfortable clothing and clothing appropriate for easy access to any Portacath or PICC line.   We strive to give you quality time with your provider. You may need to reschedule your appointment if you arrive late (15 or more minutes).  Arriving late affects you and other patients whose appointments are after yours.  Also, if you miss three or more appointments without notifying the office, you may be dismissed from the clinic at the provider's discretion.      For prescription refill requests, have your pharmacy contact our office and allow 72 hours for refills to be completed.    Today you received the following chemotherapy and/or immunotherapy agents VENOFER      To help prevent nausea and vomiting after your treatment, we encourage you to take your nausea medication as directed.  BELOW ARE SYMPTOMS THAT SHOULD BE REPORTED IMMEDIATELY: *FEVER GREATER THAN 100.4 F (38 C) OR HIGHER *CHILLS OR SWEATING *NAUSEA AND VOMITING THAT IS NOT CONTROLLED WITH YOUR NAUSEA MEDICATION *UNUSUAL SHORTNESS OF BREATH *UNUSUAL BRUISING OR BLEEDING *URINARY PROBLEMS (pain or burning when urinating, or frequent urination) *BOWEL PROBLEMS (unusual diarrhea, constipation, pain near the anus) TENDERNESS IN MOUTH AND THROAT WITH OR WITHOUT PRESENCE OF ULCERS (sore throat, sores in mouth, or a toothache) UNUSUAL RASH, SWELLING OR PAIN  UNUSUAL VAGINAL DISCHARGE OR ITCHING   Items with * indicate a potential emergency and should be followed up as soon as possible or go to the Emergency Department if any problems should occur.  Please show the CHEMOTHERAPY ALERT CARD or IMMUNOTHERAPY ALERT CARD at check-in to the  Emergency Department and triage nurse.  Should you have questions after your visit or need to cancel or reschedule your appointment, please contact [facility] [place]  [phone] and follow the prompts.  Office hours are 8:00 a.m. to 4:30 p.m. Monday - Friday. Please note that voicemails left after 4:00 p.m. may not be returned until the following business day.  We are closed weekends and major holidays. You have access to a nurse at all times for urgent questions. Please call the main number to the clinic [phone] and follow the prompts.  For any non-urgent questions, you may also contact your provider using MyChart. We now offer e-Visits for anyone 18 and older to request care online for non-urgent symptoms. For details visit [email or web address].   Also download the MyChart app! Go to the app store, search "MyChart", open the app, select [place], and log in with your MyChart username and password.  Masks are optional in the cancer centers. If you would like for your care team to wear a mask while they are taking care of you, please let them know. For doctor visits, patients may have with them one support person who is at least 83 years old. At this time, visitors are not allowed in the infusion area.   Iron Sucrose Injection What is this medication? IRON SUCROSE (EYE ern SOO krose) treats low levels of iron (iron deficiency anemia) in people with kidney disease. Iron is a mineral that plays an important role in making red blood cells, which carry oxygen from your lungs to the rest of your body. This medicine may   be used for other purposes; ask your health care provider or pharmacist if you have questions. COMMON BRAND NAME(S): Venofer What should I tell my care team before I take this medication? They need to know if you have any of these conditions: Anemia not caused by low iron levels Heart disease High levels of iron in the blood Kidney disease Liver  disease An unusual or allergic reaction to iron, other medications, foods, dyes, or preservatives Pregnant or trying to get pregnant Breast-feeding How should I use this medication? This medication is for infusion into a vein. It is given in a hospital or clinic setting. Talk to your care team about the use of this medication in children. While this medication may be prescribed for children as young as 2 years for selected conditions, precautions do apply. Overdosage: If you think you have taken too much of this medicine contact a poison control center or emergency room at once. NOTE: This medicine is only for you. Do not share this medicine with others. What if I miss a dose? It is important not to miss your dose. Call your care team if you are unable to keep an appointment. What may interact with this medication? Do not take this medication with any of the following: Deferoxamine Dimercaprol Other iron products This medication may also interact with the following: Chloramphenicol Deferasirox This list may not describe all possible interactions. Give your health care provider a list of all the medicines, herbs, non-prescription drugs, or dietary supplements you use. Also tell them if you smoke, drink alcohol, or use illegal drugs. Some items may interact with your medicine. What should I watch for while using this medication? Visit your care team regularly. Tell your care team if your symptoms do not start to get better or if they get worse. You may need blood work done while you are taking this medication. You may need to follow a special diet. Talk to your care team. Foods that contain iron include: whole grains/cereals, dried fruits, beans, or peas, leafy green vegetables, and organ meats (liver, kidney). What side effects may I notice from receiving this medication? Side effects that you should report to your care team as soon as possible: Allergic reactions--skin rash, itching, hives,  swelling of the face, lips, tongue, or throat Low blood pressure--dizziness, feeling faint or lightheaded, blurry vision Shortness of breath Side effects that usually do not require medical attention (report to your care team if they continue or are bothersome): Flushing Headache Joint pain Muscle pain Nausea Pain, redness, or irritation at injection site This list may not describe all possible side effects. Call your doctor for medical advice about side effects. You may report side effects to FDA at 1-800-FDA-1088. Where should I keep my medication? This medication is given in a hospital or clinic and will not be stored at home. NOTE: This sheet is a summary. It may not cover all possible information. If you have questions about this medicine, talk to your doctor, pharmacist, or health care provider.  2023 Elsevier/Gold Standard (2020-10-03 00:00:00)   

## 2021-11-27 DIAGNOSIS — Z471 Aftercare following joint replacement surgery: Secondary | ICD-10-CM | POA: Diagnosis not present

## 2021-11-27 DIAGNOSIS — Z952 Presence of prosthetic heart valve: Secondary | ICD-10-CM | POA: Diagnosis not present

## 2021-11-27 DIAGNOSIS — I10 Essential (primary) hypertension: Secondary | ICD-10-CM | POA: Diagnosis not present

## 2021-11-27 DIAGNOSIS — Z7901 Long term (current) use of anticoagulants: Secondary | ICD-10-CM | POA: Diagnosis not present

## 2021-11-27 DIAGNOSIS — Z9181 History of falling: Secondary | ICD-10-CM | POA: Diagnosis not present

## 2021-11-27 DIAGNOSIS — K219 Gastro-esophageal reflux disease without esophagitis: Secondary | ICD-10-CM | POA: Diagnosis not present

## 2021-11-27 DIAGNOSIS — J301 Allergic rhinitis due to pollen: Secondary | ICD-10-CM | POA: Diagnosis not present

## 2021-11-27 DIAGNOSIS — E7800 Pure hypercholesterolemia, unspecified: Secondary | ICD-10-CM | POA: Diagnosis not present

## 2021-11-27 DIAGNOSIS — Z96651 Presence of right artificial knee joint: Secondary | ICD-10-CM | POA: Diagnosis not present

## 2021-11-27 DIAGNOSIS — G4700 Insomnia, unspecified: Secondary | ICD-10-CM | POA: Diagnosis not present

## 2021-11-27 DIAGNOSIS — D509 Iron deficiency anemia, unspecified: Secondary | ICD-10-CM | POA: Diagnosis not present

## 2021-11-27 DIAGNOSIS — Z8781 Personal history of (healed) traumatic fracture: Secondary | ICD-10-CM | POA: Diagnosis not present

## 2021-11-27 DIAGNOSIS — M069 Rheumatoid arthritis, unspecified: Secondary | ICD-10-CM | POA: Diagnosis not present

## 2021-12-03 ENCOUNTER — Inpatient Hospital Stay: Payer: Medicare (Managed Care) | Attending: Oncology

## 2021-12-03 VITALS — BP 116/57 | HR 60 | Temp 98.2°F | Resp 20

## 2021-12-03 DIAGNOSIS — Z79899 Other long term (current) drug therapy: Secondary | ICD-10-CM | POA: Insufficient documentation

## 2021-12-03 DIAGNOSIS — D509 Iron deficiency anemia, unspecified: Secondary | ICD-10-CM | POA: Diagnosis not present

## 2021-12-03 MED ORDER — SODIUM CHLORIDE 0.9 % IV SOLN
Freq: Once | INTRAVENOUS | Status: AC
  Filled 2021-12-03: qty 250

## 2021-12-03 MED ORDER — SODIUM CHLORIDE 0.9 % IV SOLN
200.0000 mg | Freq: Once | INTRAVENOUS | Status: AC
  Administered 2021-12-03: 200 mg via INTRAVENOUS
  Filled 2021-12-03: qty 10

## 2021-12-03 NOTE — Patient Instructions (Signed)

## 2021-12-04 ENCOUNTER — Encounter: Payer: Self-pay | Admitting: Hematology and Oncology

## 2021-12-07 ENCOUNTER — Other Ambulatory Visit: Payer: Medicare (Managed Care)

## 2021-12-14 ENCOUNTER — Emergency Department: Payer: Medicare (Managed Care)

## 2021-12-14 ENCOUNTER — Encounter: Payer: Self-pay | Admitting: *Deleted

## 2021-12-14 ENCOUNTER — Other Ambulatory Visit: Payer: Self-pay | Admitting: Cardiovascular Disease

## 2021-12-14 ENCOUNTER — Other Ambulatory Visit: Payer: Self-pay

## 2021-12-14 DIAGNOSIS — Z85828 Personal history of other malignant neoplasm of skin: Secondary | ICD-10-CM | POA: Insufficient documentation

## 2021-12-14 DIAGNOSIS — Z7901 Long term (current) use of anticoagulants: Secondary | ICD-10-CM | POA: Diagnosis not present

## 2021-12-14 DIAGNOSIS — W01190A Fall on same level from slipping, tripping and stumbling with subsequent striking against furniture, initial encounter: Secondary | ICD-10-CM | POA: Insufficient documentation

## 2021-12-14 DIAGNOSIS — Z880 Allergy status to penicillin: Secondary | ICD-10-CM | POA: Diagnosis not present

## 2021-12-14 DIAGNOSIS — S01311A Laceration without foreign body of right ear, initial encounter: Secondary | ICD-10-CM | POA: Diagnosis not present

## 2021-12-14 DIAGNOSIS — R519 Headache, unspecified: Secondary | ICD-10-CM | POA: Diagnosis not present

## 2021-12-14 DIAGNOSIS — E785 Hyperlipidemia, unspecified: Secondary | ICD-10-CM | POA: Diagnosis not present

## 2021-12-14 DIAGNOSIS — K219 Gastro-esophageal reflux disease without esophagitis: Secondary | ICD-10-CM | POA: Insufficient documentation

## 2021-12-14 DIAGNOSIS — Z79899 Other long term (current) drug therapy: Secondary | ICD-10-CM | POA: Insufficient documentation

## 2021-12-14 DIAGNOSIS — I10 Essential (primary) hypertension: Secondary | ICD-10-CM | POA: Diagnosis not present

## 2021-12-14 DIAGNOSIS — S0101XA Laceration without foreign body of scalp, initial encounter: Secondary | ICD-10-CM | POA: Diagnosis not present

## 2021-12-14 DIAGNOSIS — I110 Hypertensive heart disease with heart failure: Secondary | ICD-10-CM | POA: Insufficient documentation

## 2021-12-14 DIAGNOSIS — Z87891 Personal history of nicotine dependence: Secondary | ICD-10-CM | POA: Diagnosis not present

## 2021-12-14 DIAGNOSIS — I6782 Cerebral ischemia: Secondary | ICD-10-CM | POA: Insufficient documentation

## 2021-12-14 DIAGNOSIS — T464X5A Adverse effect of angiotensin-converting-enzyme inhibitors, initial encounter: Secondary | ICD-10-CM | POA: Diagnosis not present

## 2021-12-14 DIAGNOSIS — Z8249 Family history of ischemic heart disease and other diseases of the circulatory system: Secondary | ICD-10-CM | POA: Insufficient documentation

## 2021-12-14 DIAGNOSIS — I2510 Atherosclerotic heart disease of native coronary artery without angina pectoris: Secondary | ICD-10-CM | POA: Diagnosis not present

## 2021-12-14 DIAGNOSIS — D61818 Other pancytopenia: Secondary | ICD-10-CM | POA: Insufficient documentation

## 2021-12-14 DIAGNOSIS — G319 Degenerative disease of nervous system, unspecified: Secondary | ICD-10-CM | POA: Insufficient documentation

## 2021-12-14 DIAGNOSIS — I4819 Other persistent atrial fibrillation: Secondary | ICD-10-CM

## 2021-12-14 DIAGNOSIS — I6529 Occlusion and stenosis of unspecified carotid artery: Secondary | ICD-10-CM | POA: Diagnosis not present

## 2021-12-14 DIAGNOSIS — I509 Heart failure, unspecified: Secondary | ICD-10-CM | POA: Insufficient documentation

## 2021-12-14 DIAGNOSIS — F32A Depression, unspecified: Secondary | ICD-10-CM | POA: Diagnosis not present

## 2021-12-14 DIAGNOSIS — M069 Rheumatoid arthritis, unspecified: Secondary | ICD-10-CM | POA: Insufficient documentation

## 2021-12-14 DIAGNOSIS — W19XXXA Unspecified fall, initial encounter: Secondary | ICD-10-CM | POA: Diagnosis not present

## 2021-12-14 DIAGNOSIS — S199XXA Unspecified injury of neck, initial encounter: Secondary | ICD-10-CM | POA: Diagnosis not present

## 2021-12-14 DIAGNOSIS — Z96653 Presence of artificial knee joint, bilateral: Secondary | ICD-10-CM | POA: Insufficient documentation

## 2021-12-14 DIAGNOSIS — E875 Hyperkalemia: Secondary | ICD-10-CM | POA: Diagnosis not present

## 2021-12-14 DIAGNOSIS — I482 Chronic atrial fibrillation, unspecified: Secondary | ICD-10-CM | POA: Diagnosis not present

## 2021-12-14 DIAGNOSIS — R9431 Abnormal electrocardiogram [ECG] [EKG]: Secondary | ICD-10-CM | POA: Insufficient documentation

## 2021-12-14 DIAGNOSIS — I480 Paroxysmal atrial fibrillation: Secondary | ICD-10-CM | POA: Insufficient documentation

## 2021-12-14 LAB — CBC WITH DIFFERENTIAL/PLATELET
Abs Immature Granulocytes: 0.04 10*3/uL (ref 0.00–0.07)
Basophils Absolute: 0 10*3/uL (ref 0.0–0.1)
Basophils Relative: 0 %
Eosinophils Absolute: 0 10*3/uL (ref 0.0–0.5)
Eosinophils Relative: 1 %
HCT: 32.9 % — ABNORMAL LOW (ref 39.0–52.0)
Hemoglobin: 10.2 g/dL — ABNORMAL LOW (ref 13.0–17.0)
Immature Granulocytes: 1 %
Lymphocytes Relative: 15 %
Lymphs Abs: 0.8 10*3/uL (ref 0.7–4.0)
MCH: 28.5 pg (ref 26.0–34.0)
MCHC: 31 g/dL (ref 30.0–36.0)
MCV: 91.9 fL (ref 80.0–100.0)
Monocytes Absolute: 0.6 10*3/uL (ref 0.1–1.0)
Monocytes Relative: 11 %
Neutro Abs: 3.9 10*3/uL (ref 1.7–7.7)
Neutrophils Relative %: 72 %
Platelets: 115 10*3/uL — ABNORMAL LOW (ref 150–400)
RBC: 3.58 MIL/uL — ABNORMAL LOW (ref 4.22–5.81)
RDW: 15.9 % — ABNORMAL HIGH (ref 11.5–15.5)
WBC: 5.3 10*3/uL (ref 4.0–10.5)
nRBC: 0 % (ref 0.0–0.2)

## 2021-12-14 LAB — COMPREHENSIVE METABOLIC PANEL
ALT: 17 U/L (ref 0–44)
AST: 29 U/L (ref 15–41)
Albumin: 4.1 g/dL (ref 3.5–5.0)
Alkaline Phosphatase: 91 U/L (ref 38–126)
Anion gap: 8 (ref 5–15)
BUN: 36 mg/dL — ABNORMAL HIGH (ref 8–23)
CO2: 25 mmol/L (ref 22–32)
Calcium: 9 mg/dL (ref 8.9–10.3)
Chloride: 102 mmol/L (ref 98–111)
Creatinine, Ser: 1.35 mg/dL — ABNORMAL HIGH (ref 0.61–1.24)
GFR, Estimated: 52 mL/min — ABNORMAL LOW (ref >60)
Glucose, Bld: 118 mg/dL — ABNORMAL HIGH (ref 70–99)
Potassium: 5.3 mmol/L — ABNORMAL HIGH (ref 3.5–5.1)
Sodium: 135 mmol/L (ref 135–145)
Total Bilirubin: 0.6 mg/dL (ref 0.3–1.2)
Total Protein: 7.6 g/dL (ref 6.5–8.1)

## 2021-12-14 LAB — PROTIME-INR
INR: 1.5 — ABNORMAL HIGH (ref 0.8–1.2)
Prothrombin Time: 18 seconds — ABNORMAL HIGH (ref 11.4–15.2)

## 2021-12-14 NOTE — ED Provider Triage Note (Signed)
Emergency Medicine Provider Triage Evaluation Note  Bryan [name] , a 81 y.o. male  was evaluated in triage.  Pt complains of ear laceration, fall, HA.  Patient presents to the ED after a fall, hitting the right side of his head.  Patient has a significant laceration through the superior aspect of the ear with ongoing bleeding.  Patient is on Eliquis.  Does complain of a headache but no visual changes, unilateral weakness, neck pain.  Fall was mechanical in nature.  Review of Systems  Positive: Fall, head injury, significant ear laceration Negative: Loss of consciousness, emesis, unilateral weakness, visual changes, neck pain  Physical Exam  BP (!) 145/75 (BP Location: Left Arm)   Pulse 69   Temp 98.8 F (37.1 C) (Oral)   Resp 20   Ht '5\' 11"'$  (1.803 m)   Wt 74 kg   SpO2 98%   BMI 22.75 kg/m  Gen:   Awake, no distress   Resp:  Normal effort  MSK:   Moves extremities without difficulty  Other:  Patient with significant laceration to the superior aspect of the right ear.  The superior aspect of the ear is debrided by a small strip of skin along the anterior portion of the ear.  Bleeding is ongoing.  Able to be controlled direct pressure.  Medical Decision Making  Medically screening exam initiated at 10:46 PM.  Appropriate orders placed.  Bryan [name] was informed that the remainder of the evaluation will be completed by another provider, this initial triage assessment does not replace that evaluation, and the importance of remaining in the ED until their evaluation is complete.  Patient fell, hit his head, no loss of consciousness.  Heavy bleeding from an ear laceration with significant laceration to the ear itself.  Patient will have imaging, labs at this time.   Darletta Moll, PA-C 12/14/21 2254

## 2021-12-14 NOTE — ED Triage Notes (Signed)
Pt lost balance and fell against the wall.  Pt has laceration to right ear and right side of head.  Bleeding controlled.  Pt on blood thinners.  No loc.   Pt alert.

## 2021-12-14 NOTE — Telephone Encounter (Signed)
Prescription refill request for Eliquis received. Indication:Afib Last office visit:3/23 UVJ:DYNXG labs Age: 81 Weight:74.1 kg  Prescription refilled

## 2021-12-14 NOTE — Telephone Encounter (Signed)
Refill request

## 2021-12-15 ENCOUNTER — Encounter
Admission: EM | Disposition: A | Discharge status: Home / Self Care | Payer: Self-pay | Source: Home / Self Care | Attending: Emergency Medicine

## 2021-12-15 ENCOUNTER — Emergency Department: Payer: Medicare (Managed Care)

## 2021-12-15 ENCOUNTER — Other Ambulatory Visit: Payer: Self-pay

## 2021-12-15 ENCOUNTER — Observation Stay: Payer: Medicare (Managed Care) | Admitting: Anesthesiology

## 2021-12-15 ENCOUNTER — Observation Stay
Admission: EM | Admit: 2021-12-15 | Discharge: 2021-12-15 | Disposition: A | Discharge status: Home / Self Care | Payer: Medicare (Managed Care) | Attending: Internal Medicine | Admitting: Internal Medicine

## 2021-12-15 ENCOUNTER — Encounter: Payer: Self-pay | Admitting: Internal Medicine

## 2021-12-15 DIAGNOSIS — E875 Hyperkalemia: Secondary | ICD-10-CM | POA: Diagnosis present

## 2021-12-15 DIAGNOSIS — W19XXXD Unspecified fall, subsequent encounter: Secondary | ICD-10-CM

## 2021-12-15 DIAGNOSIS — M7981 Nontraumatic hematoma of soft tissue: Secondary | ICD-10-CM | POA: Diagnosis not present

## 2021-12-15 DIAGNOSIS — S01311A Laceration without foreign body of right ear, initial encounter: Secondary | ICD-10-CM | POA: Diagnosis not present

## 2021-12-15 DIAGNOSIS — S40021A Contusion of right upper arm, initial encounter: Secondary | ICD-10-CM | POA: Diagnosis not present

## 2021-12-15 DIAGNOSIS — S01311D Laceration without foreign body of right ear, subsequent encounter: Secondary | ICD-10-CM | POA: Diagnosis not present

## 2021-12-15 DIAGNOSIS — D61818 Other pancytopenia: Secondary | ICD-10-CM | POA: Diagnosis not present

## 2021-12-15 DIAGNOSIS — S5001XA Contusion of right elbow, initial encounter: Secondary | ICD-10-CM | POA: Diagnosis not present

## 2021-12-15 DIAGNOSIS — F32A Depression, unspecified: Secondary | ICD-10-CM | POA: Diagnosis present

## 2021-12-15 DIAGNOSIS — W19XXXA Unspecified fall, initial encounter: Secondary | ICD-10-CM | POA: Diagnosis not present

## 2021-12-15 DIAGNOSIS — I10 Essential (primary) hypertension: Secondary | ICD-10-CM | POA: Diagnosis present

## 2021-12-15 DIAGNOSIS — I482 Chronic atrial fibrillation, unspecified: Secondary | ICD-10-CM | POA: Diagnosis present

## 2021-12-15 DIAGNOSIS — Z7901 Long term (current) use of anticoagulants: Secondary | ICD-10-CM

## 2021-12-15 HISTORY — PX: OTOPLASATY: SHX1485

## 2021-12-15 LAB — HEMOGLOBIN AND HEMATOCRIT, BLOOD
HCT: 28.7 % — ABNORMAL LOW (ref 39.0–52.0)
Hemoglobin: 9 g/dL — ABNORMAL LOW (ref 13.0–17.0)

## 2021-12-15 SURGERY — RECONSTRUCTION, EAR
Anesthesia: General | Laterality: Right

## 2021-12-15 MED ORDER — LIDOCAINE HCL (PF) 1 % IJ SOLN
INTRAMUSCULAR | Status: AC
  Filled 2021-12-15: qty 30

## 2021-12-15 MED ORDER — DEXAMETHASONE SODIUM PHOSPHATE 10 MG/ML IJ SOLN
INTRAMUSCULAR | Status: DC | PRN
  Administered 2021-12-15: 10 mg via INTRAVENOUS

## 2021-12-15 MED ORDER — LISINOPRIL 10 MG PO TABS
20.0000 mg | ORAL_TABLET | Freq: Every day | ORAL | Status: DC
Start: 2021-12-15 — End: 2021-12-15

## 2021-12-15 MED ORDER — GABAPENTIN 300 MG PO CAPS
300.0000 mg | ORAL_CAPSULE | Freq: Three times a day (TID) | ORAL | Status: DC
  Administered 2021-12-15: 300 mg via ORAL
  Filled 2021-12-15: qty 1

## 2021-12-15 MED ORDER — ROCURONIUM BROMIDE 100 MG/10ML IV SOLN
INTRAVENOUS | Status: DC | PRN
  Administered 2021-12-15: 50 mg via INTRAVENOUS

## 2021-12-15 MED ORDER — PHENYLEPHRINE 80 MCG/ML (10ML) SYRINGE FOR IV PUSH (FOR BLOOD PRESSURE SUPPORT)
PREFILLED_SYRINGE | INTRAVENOUS | Status: AC
  Filled 2021-12-15: qty 10

## 2021-12-15 MED ORDER — AMLODIPINE BESYLATE 5 MG PO TABS
5.0000 mg | ORAL_TABLET | Freq: Every day | ORAL | Status: DC
  Administered 2021-12-15: 5 mg via ORAL
  Filled 2021-12-15: qty 1

## 2021-12-15 MED ORDER — FENTANYL CITRATE (PF) 100 MCG/2ML IJ SOLN
INTRAMUSCULAR | Status: AC
  Filled 2021-12-15: qty 2

## 2021-12-15 MED ORDER — DEXAMETHASONE SODIUM PHOSPHATE 10 MG/ML IJ SOLN
INTRAMUSCULAR | Status: AC
  Filled 2021-12-15: qty 1

## 2021-12-15 MED ORDER — CEFAZOLIN SODIUM-DEXTROSE 1-4 GM/50ML-% IV SOLN
1.0000 g | Freq: Once | INTRAVENOUS | Status: AC
  Administered 2021-12-15: 1 g via INTRAVENOUS
  Filled 2021-12-15: qty 50

## 2021-12-15 MED ORDER — ONDANSETRON HCL 4 MG/2ML IJ SOLN
INTRAMUSCULAR | Status: DC | PRN
  Administered 2021-12-15: 4 mg via INTRAVENOUS

## 2021-12-15 MED ORDER — LIDOCAINE HCL 1 % IJ SOLN
INTRAMUSCULAR | Status: DC | PRN
  Administered 2021-12-15: 2 mL

## 2021-12-15 MED ORDER — LIDOCAINE-EPINEPHRINE (PF) 1 %-1:200000 IJ SOLN
INTRAMUSCULAR | Status: AC
  Filled 2021-12-15: qty 30

## 2021-12-15 MED ORDER — OXYCODONE HCL 5 MG/5ML PO SOLN: 5.0000 mg | Freq: Once | ORAL | Status: DC | PRN

## 2021-12-15 MED ORDER — ONDANSETRON HCL 4 MG/2ML IJ SOLN: 4.0000 mg | Freq: Four times a day (QID) | INTRAMUSCULAR | Status: DC | PRN

## 2021-12-15 MED ORDER — FERROUS SULFATE 325 (65 FE) MG PO TABS
325.0000 mg | ORAL_TABLET | Freq: Every day | ORAL | Status: DC
  Administered 2021-12-15: 325 mg via ORAL
  Filled 2021-12-15: qty 1

## 2021-12-15 MED ORDER — OXYCODONE HCL 5 MG PO TABS
5.0000 mg | ORAL_TABLET | Freq: Once | ORAL | Status: AC
  Administered 2021-12-15: 5 mg via ORAL
  Filled 2021-12-15: qty 1

## 2021-12-15 MED ORDER — LACTATED RINGERS IV SOLN: INTRAVENOUS | Status: DC | PRN

## 2021-12-15 MED ORDER — CALCIUM CARBONATE 1250 (500 CA) MG PO TABS: 1.0000 | ORAL_TABLET | Freq: Every day | ORAL | Status: DC

## 2021-12-15 MED ORDER — PROPOFOL 10 MG/ML IV BOLUS
INTRAVENOUS | Status: DC | PRN
  Administered 2021-12-15: 80 mg via INTRAVENOUS

## 2021-12-15 MED ORDER — CEFAZOLIN SODIUM-DEXTROSE 2-4 GM/100ML-% IV SOLN
INTRAVENOUS | Status: AC
  Filled 2021-12-15: qty 100

## 2021-12-15 MED ORDER — LIDOCAINE HCL (CARDIAC) PF 100 MG/5ML IV SOSY
PREFILLED_SYRINGE | INTRAVENOUS | Status: DC | PRN
  Administered 2021-12-15: 80 mg via INTRAVENOUS

## 2021-12-15 MED ORDER — SODIUM CHLORIDE 0.9% FLUSH
3.0000 mL | Freq: Two times a day (BID) | INTRAVENOUS | Status: DC
Start: 2021-12-15 — End: 2021-12-16
  Administered 2021-12-15: 3 mL via INTRAVENOUS

## 2021-12-15 MED ORDER — FENTANYL CITRATE (PF) 100 MCG/2ML IJ SOLN
INTRAMUSCULAR | Status: DC | PRN
  Administered 2021-12-15: 50 ug via INTRAVENOUS
  Administered 2021-12-15 (×2): 25 ug via INTRAVENOUS

## 2021-12-15 MED ORDER — OCUVITE-LUTEIN PO CAPS: 1.0000 | ORAL_CAPSULE | Freq: Two times a day (BID) | ORAL | Status: DC

## 2021-12-15 MED ORDER — CEFAZOLIN SODIUM-DEXTROSE 1-4 GM/50ML-% IV SOLN
1.0000 g | Freq: Once | INTRAVENOUS | Status: AC
Start: 2021-12-15 — End: 2021-12-15
  Administered 2021-12-15: 2 g via INTRAVENOUS

## 2021-12-15 MED ORDER — FLUOXETINE HCL 20 MG PO CAPS
20.0000 mg | ORAL_CAPSULE | Freq: Every day | ORAL | Status: DC
  Administered 2021-12-15: 20 mg via ORAL
  Filled 2021-12-15: qty 1

## 2021-12-15 MED ORDER — DEXTROSE 50 % IV SOLN
1.0000 | Freq: Once | INTRAVENOUS | Status: AC
Start: 2021-12-15 — End: 2021-12-15
  Administered 2021-12-15: 50 mL via INTRAVENOUS
  Filled 2021-12-15: qty 50

## 2021-12-15 MED ORDER — ONDANSETRON HCL 4 MG/2ML IJ SOLN: 4.0000 mg | Freq: Once | INTRAMUSCULAR | Status: DC | PRN

## 2021-12-15 MED ORDER — ROCURONIUM BROMIDE 10 MG/ML (PF) SYRINGE
PREFILLED_SYRINGE | INTRAVENOUS | Status: AC
  Filled 2021-12-15: qty 10

## 2021-12-15 MED ORDER — SODIUM CHLORIDE 0.9 % IV SOLN
250.0000 mL | INTRAVENOUS | Status: DC | PRN
Start: 2021-12-15 — End: 2021-12-16

## 2021-12-15 MED ORDER — OXYCODONE HCL 5 MG PO TABS: 5.0000 mg | ORAL_TABLET | Freq: Once | ORAL | Status: DC | PRN

## 2021-12-15 MED ORDER — SUGAMMADEX SODIUM 200 MG/2ML IV SOLN
INTRAVENOUS | Status: DC | PRN
  Administered 2021-12-15: 200 mg via INTRAVENOUS

## 2021-12-15 MED ORDER — INSULIN ASPART 100 UNIT/ML IV SOLN
10.0000 [IU] | Freq: Once | INTRAVENOUS | Status: AC
  Administered 2021-12-15: 10 [IU] via INTRAVENOUS
  Filled 2021-12-15: qty 0.1

## 2021-12-15 MED ORDER — SODIUM CHLORIDE 0.9% FLUSH: 3.0000 mL | INTRAVENOUS | Status: DC | PRN

## 2021-12-15 MED ORDER — ONDANSETRON HCL 4 MG/2ML IJ SOLN
INTRAMUSCULAR | Status: AC
  Filled 2021-12-15: qty 2

## 2021-12-15 MED ORDER — ACETAMINOPHEN 10 MG/ML IV SOLN: 1000.0000 mg | Freq: Once | INTRAVENOUS | Status: DC | PRN

## 2021-12-15 MED ORDER — BACITRACIN 500 UNIT/GM EX OINT
TOPICAL_OINTMENT | CUTANEOUS | Status: DC | PRN
  Administered 2021-12-15: 1 via TOPICAL

## 2021-12-15 MED ORDER — FENTANYL CITRATE (PF) 100 MCG/2ML IJ SOLN: 25.0000 ug | INTRAMUSCULAR | Status: DC | PRN

## 2021-12-15 MED ORDER — HYDROXYCHLOROQUINE SULFATE 200 MG PO TABS: 200.0000 mg | ORAL_TABLET | ORAL | Status: DC

## 2021-12-15 MED ORDER — ACETAMINOPHEN 500 MG PO TABS
1000.0000 mg | ORAL_TABLET | Freq: Once | ORAL | Status: AC
  Administered 2021-12-15: 1000 mg via ORAL
  Filled 2021-12-15: qty 2

## 2021-12-15 MED ORDER — VITAMIN E 45 MG (100 UNIT) PO CAPS
800.0000 [IU] | ORAL_CAPSULE | Freq: Every day | ORAL | Status: DC
  Filled 2021-12-15: qty 8

## 2021-12-15 MED ORDER — 0.9 % SODIUM CHLORIDE (POUR BTL) OPTIME
TOPICAL | Status: DC | PRN
  Administered 2021-12-15: 500 mL

## 2021-12-15 MED ORDER — ATORVASTATIN CALCIUM 20 MG PO TABS
40.0000 mg | ORAL_TABLET | Freq: Every day | ORAL | Status: DC
  Administered 2021-12-15: 40 mg via ORAL
  Filled 2021-12-15: qty 2

## 2021-12-15 MED ORDER — PHENYLEPHRINE 80 MCG/ML (10ML) SYRINGE FOR IV PUSH (FOR BLOOD PRESSURE SUPPORT)
PREFILLED_SYRINGE | INTRAVENOUS | Status: DC | PRN
  Administered 2021-12-15: 80 ug via INTRAVENOUS

## 2021-12-15 MED ORDER — VITAMIN D (ERGOCALCIFEROL) 1.25 MG (50000 UNIT) PO CAPS
50000.0000 [IU] | ORAL_CAPSULE | ORAL | Status: DC
  Filled 2021-12-15: qty 1

## 2021-12-15 MED ORDER — BACITRACIN ZINC 500 UNIT/GM EX OINT
TOPICAL_OINTMENT | CUTANEOUS | Status: AC
  Filled 2021-12-15: qty 28.35

## 2021-12-15 MED ORDER — DONEPEZIL HCL 5 MG PO TABS
10.0000 mg | ORAL_TABLET | Freq: Every day | ORAL | Status: DC
Start: 2021-12-15 — End: 2021-12-16

## 2021-12-15 MED ORDER — TRAZODONE HCL 50 MG PO TABS
50.0000 mg | ORAL_TABLET | Freq: Every evening | ORAL | Status: DC | PRN
Start: 2021-12-15 — End: 2021-12-16

## 2021-12-15 MED ORDER — PROPOFOL 10 MG/ML IV BOLUS
INTRAVENOUS | Status: AC
  Filled 2021-12-15: qty 20

## 2021-12-15 MED ORDER — HYDROCODONE-ACETAMINOPHEN 5-325 MG PO TABS: 1.0000 | ORAL_TABLET | Freq: Four times a day (QID) | ORAL | Status: DC | PRN

## 2021-12-15 MED ORDER — LOPERAMIDE HCL 2 MG PO CAPS
4.0000 mg | ORAL_CAPSULE | Freq: Two times a day (BID) | ORAL | Status: DC
  Administered 2021-12-15: 4 mg via ORAL
  Filled 2021-12-15: qty 2

## 2021-12-15 MED ORDER — PHENYLEPHRINE HCL-NACL 20-0.9 MG/250ML-% IV SOLN
INTRAVENOUS | Status: DC | PRN
  Administered 2021-12-15: 35 ug/min via INTRAVENOUS

## 2021-12-15 MED ORDER — FENOFIBRATE 160 MG PO TABS
160.0000 mg | ORAL_TABLET | Freq: Every day | ORAL | Status: DC
Start: 2021-12-15 — End: 2021-12-16

## 2021-12-15 MED ORDER — LIDOCAINE HCL (PF) 2 % IJ SOLN
INTRAMUSCULAR | Status: AC
  Filled 2021-12-15: qty 5

## 2021-12-15 MED ORDER — VITAMIN B-12 1000 MCG PO TABS
1000.0000 ug | ORAL_TABLET | Freq: Every day | ORAL | Status: DC
  Filled 2021-12-15: qty 1

## 2021-12-15 MED ORDER — ONDANSETRON HCL 4 MG PO TABS: 4.0000 mg | ORAL_TABLET | Freq: Four times a day (QID) | ORAL | Status: DC | PRN

## 2021-12-15 SURGICAL SUPPLY — 33 items
BLADE SURG 15 STRL LF DISP TIS (BLADE) ×1 IMPLANT
BLADE SURG 15 STRL SS (BLADE) ×1
BLADE SURG SZ11 CARB STEEL (BLADE) ×1 IMPLANT
BNDG GAUZE DERMACEA FLUFF (GAUZE/BANDAGES/DRESSINGS) ×1
BNDG GAUZE DERMACEA FLUFF 4 (GAUZE/BANDAGES/DRESSINGS) IMPLANT
COTTON BALL STRL MEDIUM (GAUZE/BANDAGES/DRESSINGS) ×1 IMPLANT
DRSG GLASSCOCK MASTOID ADT (GAUZE/BANDAGES/DRESSINGS) ×1 IMPLANT
ELECT REM PT RETURN 9FT ADLT (ELECTROSURGICAL) ×2 IMPLANT
ELECTRODE REM PT RTRN 9FT ADLT (ELECTROSURGICAL) ×1 IMPLANT
GAUZE 4X4 16PLY ~~LOC~~+RFID DBL (SPONGE) ×1 IMPLANT
GAUZE SPONGE 4X4 12PLY STRL (GAUZE/BANDAGES/DRESSINGS) ×1 IMPLANT
GLOVE PROTEXIS LATEX SZ 7.5 (GLOVE) ×2 IMPLANT
GLOVE SURG LATEX 7.5 PF (GLOVE) ×1 IMPLANT
GOWN STRL REUS W/ TWL LRG LVL3 (GOWN DISPOSABLE) ×2 IMPLANT
GOWN STRL REUS W/TWL LRG LVL3 (GOWN DISPOSABLE) ×2
KIT TURNOVER KIT A (KITS) ×2 IMPLANT
LABEL OR SOLS (LABEL) ×2 IMPLANT
MANIFOLD NEPTUNE II (INSTRUMENTS) ×2 IMPLANT
NS IRRIG 500ML POUR BTL (IV SOLUTION) ×2 IMPLANT
PACK HEAD/NECK (MISCELLANEOUS) ×2 IMPLANT
SUCTION FRAZIER HANDLE 10FR (MISCELLANEOUS)
SUCTION TUBE FRAZIER 10FR DISP (MISCELLANEOUS) ×1 IMPLANT
SUT ETHILON 5-0 (SUTURE)
SUT ETHILON 5-0 C-3 18XMFL BLK (SUTURE) IMPLANT
SUT MNCRL+ 5-0 VIOLET P-3 (SUTURE) IMPLANT
SUT MONOCRYL 5-0 (SUTURE) ×1
SUT PROLENE 5 0 PS 3 (SUTURE) ×2 IMPLANT
SUT VIC AB 4-0 FS2 27 (SUTURE) ×1 IMPLANT
SUT VICRYL 5-0 (SUTURE) ×1 IMPLANT
SUTURE ETHLN 5-0 C3 18XMF BLK (SUTURE) ×1 IMPLANT
SYR 10ML LL (SYRINGE) ×1 IMPLANT
SYR 3ML LL SCALE MARK (SYRINGE) ×2 IMPLANT
WATER STERILE IRR 500ML POUR (IV SOLUTION) ×1 IMPLANT

## 2021-12-15 NOTE — Assessment & Plan Note (Signed)
Stable Continue fluoxetine and trazodone

## 2021-12-15 NOTE — Anesthesia Procedure Notes (Signed)
Procedure Name: Intubation Date/Time: 12/15/2021 5:54 PM  Performed by: Loletha Grayer, CRNAPre-anesthesia Checklist: Patient identified, Patient being monitored, Timeout performed, Emergency Drugs available and Suction available Patient Re-evaluated:Patient Re-evaluated prior to induction Oxygen Delivery Method: Circle system utilized Preoxygenation: Pre-oxygenation with 100% oxygen Induction Type: IV induction Ventilation: Mask ventilation without difficulty Laryngoscope Size: McGraph and 4 Grade View: Grade I Tube type: Oral Tube size: 7.0 mm Number of attempts: 1 Airway Equipment and Method: Stylet Placement Confirmation: ETT inserted through vocal cords under direct vision, positive ETCO2 and breath sounds checked- equal and bilateral Secured at: 22 cm Tube secured with: Tape Dental Injury: Teeth and Oropharynx as per pre-operative assessment

## 2021-12-15 NOTE — Assessment & Plan Note (Signed)
Status post fall with complex laceration of the right ear ENT consult has been placed Hold apixaban for planned surgical repair

## 2021-12-15 NOTE — Discharge Instructions (Signed)

## 2021-12-15 NOTE — Progress Notes (Signed)
[employer], [employer] sent with family member at bedside.

## 2021-12-15 NOTE — Op Note (Signed)
12/15/2021  6:56 PM    Bryan [name], Bryan [name]  [id number]   Pre-Op Dx: Traumatic laceration right auricle with near avulsion involving the top one third of the auricle  Post-op Dx: Same  Proc: Complex repair of the auricle involving a through and through tear of the auricle with irregular borders.  The posterior suture line was 3-1/2 cm and the anterior suture line was stellate and 6 cm in length.  Surg:  Bryan [name]  Anes:  GOT  EBL: 25 mL  Comp: None  Findings: Patient was on blood thinners so he continued to ooze likely during the procedure.  There is no venous congestion.  The tear separated most of the upper third of the auricle.  It was through and through full-thickness of the auricle but it was a very jagged tear through the cartilage and through the skin anterior to the auricle and posterior to the auricle.  There were fragments along the external auricular border at the antihelical crus there required piecing several pieces of the skin back together.  The upper hairline extended above the conchal bowl area and went almost to the anterior antihelical crus.    Procedure: The patient was brought to the operating room and placed in the supine position.  He was given general anesthesia by oral endotracheal intubation.  His head was rotated to the left side to give better visualization of the ear.  2 mL of 1% Xylocaine was placed in the postauricular crease for anesthesia.  No epi was used to make sure the blood supply was not compromised to the flaps.  The patient was prepped and draped in a sterile fashion using Betadine to wash the ear and cleaned the wound well.  There were no foreign bodies noted in the wound.  Assessment is made of the wound first and trying to piece the tissues back together.  Anteriorly the upper flap skin was longer than the cartilage and extended over the cartilage tear area to match up with the anterior skin of the lower portion posteriorly it seemed the  skin also overlapped the cartilage from superior to inferior.  There is a stellate laceration right at the border of the helix that required multiple sutures to help hold this area together as there were multiple tears and flaps  The suturing was begun with tagging the cartilage together to hold the pieces so that they would not fall apart.  This was done with 5-0 Monocryl.  Sutures were left posteriorly where there covered with a little bit of thicker skin.  Once the cartilage was tagged together then the posterior skin was closed using 5-0 Prolene's to bring the skin edges together the upper flap pulled down over the cartilage to attach to the lower flap.  Multiple independent sutures were placed from weight up on the postauricular crease all the way down to the helix.  This was slightly at an angle where it was longer than the width of the ear.  Once the posterior portion of the ear was reconstructed then the lateral edge of the helix and antihelical fold were reconstructed with multiple sutures because little flaps of skin were torn and pulled back and multiple areas.  Once the edge was closed then the upper flap of skin was pulled over the cartilage tear and was sutured to the lower anterior skin to close the anterior wound.  This extended all the way to the medial antihelix.  The conchal bowl was not involved.  The flaps look  very healthy and there is no venous congestion.  There were pink and had good blood return.  The wound was washed up and then bacitracin was placed over the wound edges.  Cottonball was placed into the ear canal.  Telfa was placed on the back and front of the auricle with more cotton balls to help mold the auricle.  A fluffy pressure dressing was then placed over the ear and Kerlix was wrapped around his head to help hold this into position.  The patient was awakened and taken to the recovery room in satisfactory condition.  There were no operative complications.  The patient  tolerated the procedure very well.  Dispo: To PACU to be discharged home  Plan: To follow-up in the office in 2 days for wound check.  We will plan to remove sutures next week on Tuesday.  He has oxycodone at home that he uses for pain occasionally and this should be more than enough for his ear, or he can use some Tylenol.  He can restart his Eliquis tomorrow.  He can call the office tomorrow if he has any problems but otherwise we will see him the next day to evaluate the wound.  He can start his diet as tolerated.  He lives at home alone but a family member will stay with him tonight to make sure he is doing well.  Bryan [name]  12/15/2021 6:56 PM

## 2021-12-15 NOTE — ED Provider Notes (Signed)
[facility] Provider Note    Event Date/Time   First MD Initiated Contact with Patient 12/15/21 [id number]     (approximate)   History   Laceration   HPI  Bryan GUGGENHEIM is a 81 y.o. male who presents to the ED for evaluation of Laceration   I reviewed hematology visit from 6/16.History of RA and associated mild pancytopenia.  HTN, HLD.  Anticoagulated on Eliquis.  Patient presents to the ED for evaluation of a right ear injury and laceration after an accidental fall.  Reports he had a knee replaced electively few months ago, and he thinks his knee just gave out causing him to fall and strike to the right side of his lateral head on furniture causing injury.  Denies syncope or other injury.   Physical Exam   Triage Vital Signs: ED Triage Vitals  Enc Vitals Group     BP 12/14/21 2245 (!) 145/75     Pulse Rate 12/14/21 2245 69     Resp 12/14/21 2245 20     Temp 12/14/21 2245 98.8 F (37.1 C)     Temp Source 12/14/21 2245 Oral     SpO2 12/14/21 2245 98 %     Weight 12/14/21 2243 163 lb 2.3 oz (74 kg)     Height 12/14/21 2243 '5\' 11"'$  (1.803 m)     Head Circumference --      Peak Flow --      Pain Score 12/14/21 2242 5     Pain Loc --      Pain Edu? --      Excl. in [place]? --     Most recent vital signs: Vitals:   12/15/21 0355 12/15/21 0600  BP: (!) 141/89 (!) 160/81  Pulse: 69 65  Resp: 18 18  Temp: 98.2 F (36.8 C)   SpO2: 100% 98%    General: Awake, no distress.  CV:  Good peripheral perfusion.  Resp:  Normal effort.  Abd:  No distention.  MSK:  Bruising posteriorly and close deformity to posterior aspect of superior to his right elbow.  Full active and passive range of motion.  Mild tenderness with palpation of the bruised area. Neuro:  No focal deficits appreciated. Other:  Horizontal laceration through the superior aspect of the right external ear through the level of the triangular fossa.  Posteriorly, the helix is completely  disrupted and there is a 1cm wide piece of tissue from his superior ear that is almost completely amputated, just hanging on by the crus of the helix anteriorly.  Just superior to his right ear, hematoma to the right parietal scalp is noted that is somewhat boggy but no bony step-offs are appreciated.  No discrete lacerations appreciated.  Small superficial abrasion is noted.  No active bleeding.   ED Results / Procedures / Treatments   Labs (all labs ordered are listed, but only abnormal results are displayed) Labs Reviewed  COMPREHENSIVE METABOLIC PANEL - Abnormal; Notable for the following components:      Result Value   Potassium 5.3 (*)    Glucose, Bld 118 (*)    BUN 36 (*)    Creatinine, Ser 1.35 (*)    GFR, Estimated 52 (*)    All other components within normal limits  CBC WITH DIFFERENTIAL/PLATELET - Abnormal; Notable for the following components:   RBC 3.58 (*)    Hemoglobin 10.2 (*)    HCT 32.9 (*)    RDW 15.9 (*)  Platelets 115 (*)    All other components within normal limits  PROTIME-INR - Abnormal; Notable for the following components:   Prothrombin Time 18.0 (*)    INR 1.5 (*)    All other components within normal limits    EKG   RADIOLOGY CT head interpreted by me without evidence of acute intracranial pathology CT cervical spine interpreted by me without evidence of cervical fracture  Official radiology report(s): CT Head Wo Contrast  Result Date: 12/14/2021 CLINICAL DATA:  Trauma fall EXAM: CT HEAD WITHOUT CONTRAST CT CERVICAL SPINE WITHOUT CONTRAST TECHNIQUE: Multidetector CT imaging of the head and cervical spine was performed following the standard protocol without intravenous contrast. Multiplanar CT image reconstructions of the cervical spine were also generated. RADIATION DOSE REDUCTION: This exam was performed according to the departmental dose-optimization program which includes automated exposure control, adjustment of the mA and/or kV according to  patient size and/or use of iterative reconstruction technique. COMPARISON:  CT 01/08/2020, 12/05/2017 FINDINGS: CT HEAD FINDINGS Brain: No acute territorial infarction, hemorrhage or intracranial mass. Mild atrophy and chronic small vessel ischemic changes of the white matter. Nonenlarged ventricles Vascular: No hyperdense vessels.  Carotid vascular calcification Skull: No fracture Sinuses/Orbits: No acute finding. Other: Large right parietal scalp laceration and hematoma CT CERVICAL SPINE FINDINGS Alignment: Alignment within normal limits. No subluxation. Facet alignment is normal Skull base and vertebrae: Cranial vertebral junction appears intact. Chronic mild superior endplate deformities at C7 and T1. No definitive acute fracture is seen. Soft tissues and spinal canal: No prevertebral fluid or swelling. No visible canal hematoma. Disc levels:  Disc spaces appear relatively patent. Upper chest: Mild branching calcifications at the right apex probably postinflammatory. Other: None IMPRESSION: 1. No CT evidence for acute intracranial abnormality. Atrophy and chronic small vessel ischemic changes of the white matter. Large right parietal scalp laceration and hematoma 2. No definite acute osseous abnormality of the cervical spine. Electronically Signed   By: Donavan Foil M.D.   On: 12/14/2021 23:53   CT Cervical Spine Wo Contrast  Result Date: 12/14/2021 CLINICAL DATA:  Trauma fall EXAM: CT HEAD WITHOUT CONTRAST CT CERVICAL SPINE WITHOUT CONTRAST TECHNIQUE: Multidetector CT imaging of the head and cervical spine was performed following the standard protocol without intravenous contrast. Multiplanar CT image reconstructions of the cervical spine were also generated. RADIATION DOSE REDUCTION: This exam was performed according to the departmental dose-optimization program which includes automated exposure control, adjustment of the mA and/or kV according to patient size and/or use of iterative reconstruction  technique. COMPARISON:  CT 01/08/2020, 12/05/2017 FINDINGS: CT HEAD FINDINGS Brain: No acute territorial infarction, hemorrhage or intracranial mass. Mild atrophy and chronic small vessel ischemic changes of the white matter. Nonenlarged ventricles Vascular: No hyperdense vessels.  Carotid vascular calcification Skull: No fracture Sinuses/Orbits: No acute finding. Other: Large right parietal scalp laceration and hematoma CT CERVICAL SPINE FINDINGS Alignment: Alignment within normal limits. No subluxation. Facet alignment is normal Skull base and vertebrae: Cranial vertebral junction appears intact. Chronic mild superior endplate deformities at C7 and T1. No definitive acute fracture is seen. Soft tissues and spinal canal: No prevertebral fluid or swelling. No visible canal hematoma. Disc levels:  Disc spaces appear relatively patent. Upper chest: Mild branching calcifications at the right apex probably postinflammatory. Other: None IMPRESSION: 1. No CT evidence for acute intracranial abnormality. Atrophy and chronic small vessel ischemic changes of the white matter. Large right parietal scalp laceration and hematoma 2. No definite acute osseous abnormality of the cervical spine.  Electronically Signed   By: Donavan Foil M.D.   On: 12/14/2021 23:53    PROCEDURES and INTERVENTIONS:  Procedures  Medications  acetaminophen (TYLENOL) tablet 1,000 mg (1,000 mg Oral Given 12/15/21 0617)  oxyCODONE (Oxy IR/ROXICODONE) immediate release tablet 5 mg (5 mg Oral Given 12/15/21 0617)     IMPRESSION / MDM / ASSESSMENT AND PLAN / ED COURSE  I reviewed the triage vital signs and the nursing notes.  Differential diagnosis includes, but is not limited to, ear laceration, scalp laceration, ICH, skull fracture  {Patient presents with symptoms of an acute illness or injury that is potentially life-threatening.  Pleasant and anticoagulated 81 year old male presents to the ED after a fall and striking his right ear on  some furniture causing a laceration to the superior aspect of his ear.  Full-thickness laceration nearly removing the top 1-2 cm, but remains tethered anteriorly at the helical crus.  I am not sure I am able to effectively repair this myself at the bedside and therefore consult ENT who can add the patient onto his schedule by the end of the day today.  He will come evaluate the patient in the ED now prior to his first case this morning which is at 730.  Requests medical observation admission until that time.  We will consult with medicine as well.  Clinical Course as of 12/15/21 0657  Tue Dec 15, 2021  0550 ENT paged [DS]  0620 Had secretary page again [DS]  [phone] I consult with Dr. Kathyrn Sheriff, ENT, he will come take a look at it.  [DS]    Clinical Course User Index [DS] Vladimir Crofts, MD     FINAL CLINICAL IMPRESSION(S) / ED DIAGNOSES   Final diagnoses:  Fall, initial encounter  Anticoagulated  Ear lobe laceration, right, initial encounter     Rx / DC Orders   ED Discharge Orders     None        Note:  This document was prepared using Dragon voice recognition software and may include unintentional dictation errors.   Vladimir Crofts, MD 12/15/21 [phone]

## 2021-12-15 NOTE — ED Notes (Signed)
Pt to rm 14 at this time via wc. Pt placed on cardiac monitor. Call light within reach. Pt has no further needs at this time.

## 2021-12-15 NOTE — Assessment & Plan Note (Signed)
Patient remains in A-fib but is rate controlled Hold Eliquis for planned procedure

## 2021-12-15 NOTE — Consult Note (Signed)
Bryan [name], [name] [birth year] Bryan Bullock, MD  Reason for Consult: Evaluate right auricle laceration  HPI: The patient is an 81 year old white male who is on anticoagulation therapy because of A-fib.  He has had recent right knee arthroplasty and was in his room last night and went to stand up and turned and his knee gave out and he fell and hit the right side of his head.  He did not pass out.  He had a lot of bleeding because of the anticoagulation.  He was brought by a friend to the emergency room department.  He is noted to have a laceration across his right auricle that is through and through skin cartilage and skin.  It is attached by an anterior flap only.  He has some abrasions of his scalp just above that in the temporal area but not a major laceration there.  The wound was rebandaged with fresh gauze and some of the old clots were removed.  This is rebandaged temporarily until surgical repair of the right auricle can be done.  Allergies:  Allergies  Allergen Reactions   Penicillins Hives, Swelling and Other (See Comments)    SWELLING REACTION UNSPECIFIED PATIENT HAS TAKEN AMOXICILLIN ON MED HX FROM [facility] PATIENT HAS HAD A PCN REACTION WITH IMMEDIATE RASH, FACIAL/TONGUE/THROAT SWELLING, SOB, OR LIGHTHEADEDNESS WITH HYPOTENSION:  #  #  #  YES  #  #  #   Has patient had a PCN reaction causing severe rash involving mucus membranes or skin necrosis: No Has patient had a PCN reaction that required hospitalization No Has patient had a PCN reaction occurring within the last 10 years: No   Demerol [Meperidine] Hives and Nausea And Vomiting   Other     ROS: Review of systems normal other than 12 systems except per HPI.  PMH:  Past Medical History:  Diagnosis Date   Abnormal CT scan    Asymmetric left rectal wall thickening    Anemia    Anxiety    Arrhythmia    Basal cell carcinoma 04/28/2020   L forehead - ED&C 06/17/2020   Basal cell carcinoma 05/06/2021   left scalp  postauricular ant, EDC   Basal cell carcinoma 05/06/2021   left scalp postauricular post, EDC   CHF (congestive heart failure) ([place])    Chronic lower back pain    Complication of anesthesia    Memory loss 09/2015   Coronary artery disease    Depression    GERD (gastroesophageal reflux disease)    Glaucoma    H/O thoracic aortic aneurysm repair    Headache    Heart murmur    History of being hospitalized    memory lose kidney funtion down blood pressure up   History of blood transfusion    "think he had one when he had heart valve OR" (10/14/2016); "none since" (10/19/2017)   History of chicken pox    Hyperlipidemia    Hypertension    Lumbar stenosis    Murmur    Neuropathy    Osteoarthritis    worse in feet and ankles   Paroxysmal A-fib (HCC)    Poor short term memory    takes Aricept   Rheumatoid arthritis ([place])    "all over" (10/14/2016)   Schizophrenia ([place])    Seasonal allergies    Thoracic spine pain    Valvular heart disease    Wears glasses    reading    FH:  Family History  Problem Relation Age of Onset  Heart failure Mother    Hypertension Mother    Asthma Mother    Osteoporosis Mother    Heart attack Father 32       MI   Hypertension Sister    Prostate cancer Neg Hx    Bladder Cancer Neg Hx    Kidney cancer Neg Hx    Colon cancer Neg Hx     SH:  Social History   Socioeconomic History   Marital status: Widowed    Spouse name: Not on file   Number of children: Not on file   Years of education: Not on file   Highest education level: Not on file  Occupational History   Occupation: retired  Tobacco Use   Smoking status: Former    Packs/day: 1.00    Years: 32.00    Total pack years: 32.00    Types: Cigarettes    Quit date: 07/05/1981    Years since quitting: 40.4   Smokeless tobacco: Never  Vaping Use   Vaping Use: Never used  Substance and Sexual Activity   Alcohol use: Not Currently    Comment: "glass of wine/month; if that"   Drug  use: Never   Sexual activity: Not Currently  Other Topics Concern   Not on file  Social History Narrative   Not on file   Social Determinants of Health   Financial Resource Strain: Low Risk  (02/06/2021)   Overall Financial Resource Strain (CARDIA)    Difficulty of Paying Living Expenses: Not hard at all  Food Insecurity: No [place] (02/06/2021)   Hunger Vital Sign    Worried About Running Out of Food in the Last Year: Never true    [place] in the Last Year: Never true  Transportation Needs: No Transportation Needs (02/06/2021)   PRAPARE - Hydrologist (Medical): No    Lack of Transportation (Non-Medical): No  Physical Activity: Insufficiently Active (02/06/2021)   Exercise Vital Sign    Days of Exercise per Week: 2 days    Minutes of Exercise per Session: 30 min  Stress: No Stress Concern Present (02/06/2021)   [place]    Feeling of Stress : Not at all  Social Connections: Unknown (02/06/2021)   Social Connection and Isolation Panel [NHANES]    Frequency of Communication with Friends and Family: More than three times a week    Frequency of Social Gatherings with Friends and Family: More than three times a week    Attends Religious Services: More than 4 times per year    Active Member of [employer] or Organizations: Yes    Attends Archivist Meetings: More than 4 times per year    Marital Status: Not on file  Intimate Partner Violence: Not At Risk (02/06/2021)   Humiliation, Afraid, Rape, and Kick questionnaire    Fear of Current or Ex-Partner: No    Emotionally Abused: No    Physically Abused: No    Sexually Abused: No    PSH:  Past Surgical History:  Procedure Laterality Date   AORTIC VALVE REPLACEMENT  2007   [place].  [place], [place]; "pig valve"   APPENDECTOMY  2010   BACK SURGERY     CARDIAC VALVE REPLACEMENT     CATARACT EXTRACTION W/  INTRAOCULAR LENS  IMPLANT, BILATERAL Bilateral 2012   COLONOSCOPY WITH PROPOFOL N/A 09/24/2016   Procedure: COLONOSCOPY WITH PROPOFOL;  Surgeon: Jonathon Bellows, MD;  Location: [facility] ENDOSCOPY;  Service: Endoscopy;  Laterality: N/A;   ESOPHAGOGASTRODUODENOSCOPY (EGD) WITH PROPOFOL N/A 09/24/2016   Procedure: ESOPHAGOGASTRODUODENOSCOPY (EGD) WITH PROPOFOL;  Surgeon: Jonathon Bellows, MD;  Location: [facility] END[facility]COPY;  Service: Endoscopy;  Laterality: N/A;   GIVENS CAPSULE STUDY N/A 09/24/2016   Procedure: GIVENS CAPSULE STUDY;  Surgeon: Jonathon Bellows, MD;  Location: [facility] ENDOSCOPY;  Service: Endoscopy;  Laterality: N/A;   HAMMER TOE SURGERY Right 10/09/2015   Procedure: HAMMER TOE REPAIR WITH K-WIRE FIXATION RIGHT SECOND TOE;  Surgeon: Albertine Patricia, DPM;  Location: [place];  Service: Podiatry;  Laterality: Right;  WITH LOCAL   HARDWARE REMOVAL N/A 11/02/2019   Procedure: Removal of hardware thoracic spine;  Surgeon: Kary Kos, MD;  Location: [place];  Service: Neurosurgery;  Laterality: N/A;  posterior   INGUINAL HERNIA REPAIR Right 05/05/2018   Medium Bard PerFix plug.  Surgeon: Robert Bellow, MD;  Location: [facility] ORS;  Service: General;  Laterality: Right;   IR VERTEBROPLASTY CERV/THOR BX INC UNI/BIL INC/INJECT/IMAGING  10/27/2018   JOINT REPLACEMENT Left    left total knee   LAMINECTOMY WITH POSTERIOR LATERAL ARTHRODESIS LEVEL 3 N/A 09/28/2017   Procedure: LUMBAR  POSTEROR  FUSION REVISION - LUMBAR ONE -TWO, LUMBAR TWO -THREE,THREE-FOUR ,BILATERAL ARTHRODESIS REMOVAL LUMBAR THREE HARDWARE;  Surgeon: Kary Kos, MD;  Location: [place];  Service: Neurosurgery;  Laterality: N/A;   LAMINECTOMY WITH POSTERIOR LATERAL ARTHRODESIS LEVEL 3 N/A 10/20/2017   Procedure: Revision fusion and removal of hardware Lumbar one, Pedicle screw fixation thoracic ten-lumbar two, thoracic nine-ten laminotomy, Posterior Lumbar Arthrodesis thoracic ten-lumbar two ;  Surgeon: Kary Kos, MD;  Location: [place];  Service: Neurosurgery;   Laterality: N/A;   LAPAROSCOPIC CHOLECYSTECTOMY  2010   LUMBAR FUSION Right 06/08/2017   LUMBAR THREE-FOUR, LUMBAR FOUR-FIVE POSTEROLATERAL ARTHRODESIS WITH RIGHT LUMBAR FOUR-FIVE LAMINECTOMY/FORAMINOTOMY   LUMBAR LAMINECTOMY/DECOMPRESSION MICRODISCECTOMY Right 03/08/2016   Procedure: Laminectomy and Foraminotomy - Lumbar Five-Sacral One Right;  Surgeon: Kary Kos, MD;  Location: [place];  Service: Neurosurgery;  Laterality: Right;  Right   MULTIPLE TOOTH EXTRACTIONS     "3"   POSTERIOR LUMBAR FUSION  10/14/2016   L5-S1   REPLACEMENT TOTAL KNEE Left 2003   THORACIC AORTIC ANEURYSM REPAIR  2010    Physical  Exam: The patient had a bandage on his head when I first saw him that was quite bloody.  He is alert and oriented x3 and a good historian.  He has a an abrasion of his right temporal area just above the auricle and then a laceration starting posteriorly at the junction of the upper one third and lower two thirds of the auricle.  This extends forward through the cartilage of the superior auricle and the helix.  There is a flap of skin and tissue anteriorly that is holding it in place.  This is a rupture of tissue from blunt trauma as there are no straight lines but just torn cartilage and skin on both sides that goes through and through the auricle.  I do not see any other obvious injuries to the auricle right now.  The bandage was replaced.  The patient does not have any swelling on his face.  His facial nerves are intact.  His nose is open and his oropharynx is clear.  His neck is negative for any pain or signs of trauma.  He has no nodes or masses.   A/P: Patient has a traumatic laceration of his right auricle.  This is a tear that will  need complex repair to reconstruct the auricle.  There appears to be enough skin tissue flap anteriorly that the superior flap of auricle still appears to have a good blood supply.  Will leave the bandage on until we can obtain operative time to take him to the  OR and reconstruct his ear.  He will take some of his medicines with clear liquids this morning but then will be n.p.o. until the operative time.  I have now spoken to the OR and the scheduled time is 5:30 PM for surgery.  He will get some IV fluids and we will plan to give him IV Ancef for helping prevent infection.  He has a stated penicillin allergy but has taken amoxicillin before without problems.   Elon Alas Jaymian Bogart 12/15/2021 9:32 AM

## 2021-12-15 NOTE — Assessment & Plan Note (Signed)
Related to ACE inhibitor use Hold lisinopril Treat with dextrose and insulin

## 2021-12-15 NOTE — Progress Notes (Signed)
Patients bandage saturated with blood. Bandage changed by [employer] RN. Upon removing bandage, small clot discovered underneath. Attending MD made aware. H&H ordered.

## 2021-12-15 NOTE — Anesthesia Preprocedure Evaluation (Signed)
Anesthesia Evaluation  Patient identified by MRN, date of birth, ID band Patient awake    Reviewed: Allergy & Precautions, NPO status , Patient's Chart, lab work & pertinent test results  History of Anesthesia Complications Negative for: history of anesthetic complications (memory problems in 2017 following MAC )  Airway Mallampati: II  TM Distance: >3 FB Neck ROM: Full    Dental  (+) Dental Advisory Given, Missing   Pulmonary neg pulmonary ROS, neg sleep apnea, neg COPD, Patient abstained from smoking.Not current smoker, former smoker,    Pulmonary exam normal breath sounds clear to auscultation       Cardiovascular Exercise Tolerance: Good METShypertension, Pt. on medications + CAD and +CHF  (-) Past MI + dysrhythmias Atrial Fibrillation + Valvular Problems/Murmurs (s/p AVR)  Rhythm:Irregular Rate:Normal   '20 TTE - EF 55-60%. RV cavity was mildly enlarged. Right ventricular systolic pressure is mildly elevated with an estimated pressure of 37.5 mmHg. LA was severely dilated. Pulmonic valve regurgitation is trivial by color flow Doppler. There is mild dilatation of the aortic root measuring 39 mm.   Hx thoracic AAA repair and AVR 2010      Neuro/Psych  Headaches, PSYCHIATRIC DISORDERS Anxiety Depression    GI/Hepatic Neg liver ROS, GERD  Medicated and Controlled, IBS    Endo/Other  negative endocrine ROSneg diabetes  Renal/GU CRFRenal disease     Musculoskeletal  (+) Arthritis , Rheumatoid disorders,    Abdominal   Peds  Hematology  (+) Blood dyscrasia, anemia ,  On eliquis    Anesthesia Other Findings Past Medical History: No date: Abnormal CT scan     Comment:  Asymmetric left rectal wall thickening  No date: Anemia No date: Anxiety No date: Arrhythmia 04/28/2020: Basal cell carcinoma     Comment:  L forehead - ED&C 06/17/2020 05/06/2021: Basal cell carcinoma     Comment:  left scalp postauricular  ant, EDC 05/06/2021: Basal cell carcinoma     Comment:  left scalp postauricular post, EDC No date: CHF (congestive heart failure) (HCC) No date: Chronic lower back pain No date: Complication of anesthesia     Comment:  Memory loss 09/2015 No date: Coronary artery disease No date: Depression No date: GERD (gastroesophageal reflux disease) No date: Glaucoma No date: H/O thoracic aortic aneurysm repair No date: Headache No date: Heart murmur No date: History of being hospitalized     Comment:  memory lose kidney funtion down blood pressure up No date: History of blood transfusion     Comment:  "think he had one when he had heart valve OR"               (10/14/2016); "none since" (10/19/2017) No date: History of chicken pox No date: Hyperlipidemia No date: Hypertension No date: Lumbar stenosis No date: Murmur No date: Neuropathy No date: Osteoarthritis     Comment:  worse in feet and ankles No date: Paroxysmal A-fib ([place]) No date: Poor short term memory     Comment:  takes Aricept No date: Rheumatoid arthritis ([place])     Comment:  "all over" (10/14/2016) No date: Schizophrenia ([place]) No date: Seasonal allergies No date: Thoracic spine pain No date: Valvular heart disease No date: Wears glasses     Comment:  reading  Reproductive/Obstetrics                             Anesthesia Physical  Anesthesia Plan  ASA: 3  Anesthesia Plan:  General   Post-op Pain Management: Tylenol PO (pre-op)* and Gabapentin PO (pre-op)*   Induction: Intravenous  PONV Risk Score and Plan: 3 and Ondansetron, Treatment may vary due to age or medical condition and Dexamethasone  Airway Management Planned: Oral ETT  Additional Equipment: None  Intra-op Plan:   Post-operative Plan: Extubation in OR  Informed Consent: I have reviewed the patients History and Physical, chart, labs and discussed the procedure including the risks, benefits and alternatives for the proposed  anesthesia with the patient or authorized representative who has indicated his/her understanding and acceptance.     Dental advisory given  Plan Discussed with: CRNA and Anesthesiologist  Anesthesia Plan Comments: (Discussed risks of anesthesia with patient, including PONV, sore throat, lip/dental/eye damage. Rare risks discussed as well, such as cardiorespiratory and neurological sequelae, and allergic reactions. Discussed the role of CRNA in patient's perioperative care. Patient understands. Patient on eliquis.)        Anesthesia Quick Evaluation

## 2021-12-15 NOTE — Assessment & Plan Note (Addendum)
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

## 2021-12-15 NOTE — H&P (Addendum)
History and Physical    Patient: Bryan [name] DOB: 23-Mar-1941 DOA: 12/15/2021 DOS: the patient was seen and examined on 12/15/2021 PCP: [name], MD  Patient coming from: Home  Chief Complaint:  Chief Complaint  Patient presents with   Laceration   HPI: Bryan [name] is a 81 y.o. male with medical history significant for A-fib on chronic anticoagulation therapy, depression, coronary artery disease, GERD, rheumatoid arthritis, status post right total knee arthroplasty who presents to the ER via EMS for evaluation after a fall with laceration of his right ear. Patient states that he was getting ready to go to bed and as he stood up he twisted his right knee and fell landing on his right side.  He thinks he may have hit some furniture or the door because he had significant bleeding from his right ear.  He denies any loss of consciousness.  He was able to get up after the fall with some assistance from a friend who was there at the time. He denied feeling dizzy or lightheaded, denies having any nausea, no vomiting, no chest pain, no shortness of breath, no fever, no chills, no cough, no changes in his bowel habits, no urinary symptoms, no blurred vision no focal deficit. ENT was consulted by the ER physician who plans on taking this patient to surgery later today.   Review of Systems: As mentioned in the history of present illness. All other systems reviewed and are negative. Past Medical History:  Diagnosis Date   Abnormal CT scan    Asymmetric left rectal wall thickening    Anemia    Anxiety    Arrhythmia    Basal cell carcinoma 04/28/2020   L forehead - ED&C 06/17/2020   Basal cell carcinoma 05/06/2021   left scalp postauricular ant, EDC   Basal cell carcinoma 05/06/2021   left scalp postauricular post, [facility]   CHF (congestive heart failure) ([place])    Chronic lower back pain    Complication of anesthesia    Memory loss 09/2015   Coronary artery  disease    Depression    GERD (gastroesophageal reflux disease)    Glaucoma    H/O thoracic aortic aneurysm repair    Headache    Heart murmur    History of being hospitalized    memory lose kidney funtion down blood pressure up   History of blood transfusion    "think he had one when he had heart valve OR" (10/14/2016); "none since" (10/19/2017)   History of chicken pox    Hyperlipidemia    Hypertension    Lumbar stenosis    Murmur    Neuropathy    Osteoarthritis    worse in feet and ankles   Paroxysmal A-fib ([place])    Poor short term memory    takes Aricept   Rheumatoid arthritis ([place])    "all over" (10/14/2016)   Schizophrenia ([place])    Seasonal allergies    Thoracic spine pain    Valvular heart disease    Wears glasses    reading   Past Surgical History:  Procedure Laterality Date   AORTIC VALVE REPLACEMENT  2007   [facility].  Supply, [place]; "pig valve"   APPENDECTOMY  2010   BACK SURGERY     CARDIAC VALVE REPLACEMENT     CATARACT EXTRACTION W/ INTRAOCULAR LENS  IMPLANT, BILATERAL Bilateral 2012   COLONOSCOPY WITH PROPOFOL N/A 09/24/2016   Procedure: COLONOSCOPY WITH PROPOFOL;  Surgeon: Jonathon Bellows,  MD;  Location: [facility] ENDOSCOPY;  Service: Endoscopy;  Laterality: N/A;   ESOPHAGOGASTRODUODENOSCOPY (EGD) WITH PROPOFOL N/A 09/24/2016   Procedure: ESOPHAGOGASTRODUODENOSCOPY (EGD) WITH PROPOFOL;  Surgeon: Jonathon Bellows, MD;  [facility] END[facility]COPY;  Service: Endoscopy;  Laterality: N/A;   GIVENS CAPSULE STUDY N/A 09/24/2016   Procedure: GIVENS CAPSULE STUDY;  Surgeon: Jonathon Bellows, MD;  Location: [facility] ENDOSCOPY;  Service: Endoscopy;  Laterality: N/A;   HAMMER TOE SURGERY Right 10/09/2015   Procedure: HAMMER TOE REPAIR WITH K-WIRE FIXATION RIGHT SECOND TOE;  Surgeon: Albertine Patricia, DPM;  Location: [place];  Service: Podiatry;  Laterality: Right;  WITH LOCAL   HARDWARE REMOVAL N/A 11/02/2019   Procedure: Removal of hardware thoracic spine;  Surgeon: Kary Kos, MD;   Location: [place];  Service: Neurosurgery;  Laterality: N/A;  posterior   INGUINAL HERNIA REPAIR Right 05/05/2018   Medium Bard PerFix plug.  Surgeon: Robert Bellow, MD;  Location: [facility] ORS;  Service: General;  Laterality: Right;   IR VERTEBROPLASTY CERV/THOR BX INC UNI/BIL INC/INJECT/IMAGING  10/27/2018   JOINT REPLACEMENT Left    left total knee   LAMINECTOMY WITH POSTERIOR LATERAL ARTHRODESIS LEVEL 3 N/A 09/28/2017   Procedure: LUMBAR  POSTEROR  FUSION REVISION - LUMBAR ONE -TWO, LUMBAR TWO -THREE,THREE-FOUR ,BILATERAL ARTHRODESIS REMOVAL LUMBAR THREE HARDWARE;  Surgeon: Kary Kos, MD;  Location: [place];  Service: Neurosurgery;  Laterality: N/A;   LAMINECTOMY WITH POSTERIOR LATERAL ARTHRODESIS LEVEL 3 N/A 10/20/2017   Procedure: Revision fusion and removal of hardware Lumbar one, Pedicle screw fixation thoracic ten-lumbar two, thoracic nine-ten laminotomy, Posterior Lumbar Arthrodesis thoracic ten-lumbar two ;  Surgeon: Kary Kos, MD;  Location: [place];  Service: Neurosurgery;  Laterality: N/A;   LAPAROSCOPIC CHOLECYSTECTOMY  2010   LUMBAR FUSION Right 06/08/2017   LUMBAR THREE-FOUR, LUMBAR FOUR-FIVE POSTEROLATERAL ARTHRODESIS WITH RIGHT LUMBAR FOUR-FIVE LAMINECTOMY/FORAMINOTOMY   LUMBAR LAMINECTOMY/DECOMPRESSION MICRODISCECTOMY Right 03/08/2016   Procedure: Laminectomy and Foraminotomy - Lumbar Five-Sacral One Right;  Surgeon: Kary Kos, MD;  Location: [place];  Service: Neurosurgery;  Laterality: Right;  Right   MULTIPLE TOOTH EXTRACTIONS     "3"   POSTERIOR LUMBAR FUSION  10/14/2016   L5-S1   REPLACEMENT TOTAL KNEE Left 2003   THORACIC AORTIC ANEURYSM REPAIR  2010   Social History:  reports that he quit smoking about 40 years ago. His smoking use included cigarettes. He has a 32.00 pack-year smoking history. He has never used smokeless tobacco. He reports that he does not currently use alcohol. He reports that he does not use drugs.  Allergies  Allergen Reactions   Penicillins Hives,  Swelling and Other (See Comments)    SWELLING REACTION UNSPECIFIED PATIENT HAS TAKEN AMOXICILLIN ON MED HX FROM [facility] PATIENT HAS HAD A PCN REACTION WITH IMMEDIATE RASH, FACIAL/TONGUE/THROAT SWELLING, SOB, OR LIGHTHEADEDNESS WITH HYPOTENSION:  #  #  #  YES  #  #  #   Has patient had a PCN reaction causing severe rash involving mucus membranes or skin necrosis: No Has patient had a PCN reaction that required hospitalization No Has patient had a PCN reaction occurring within the last 10 years: No   Demerol [Meperidine] Hives and Nausea And Vomiting   Other     Family History  Problem Relation Age of Onset   Heart failure Mother    Hypertension Mother    Asthma Mother    Osteoporosis Mother    Heart attack Father 71       MI   Hypertension Sister  Prostate cancer Neg Hx    Bladder Cancer Neg Hx    Kidney cancer Neg Hx    Colon cancer Neg Hx     Prior to Admission medications   Medication Sig Start Date End Date Taking? Authorizing Provider  amLODipine (NORVASC) 5 MG tablet TAKE 1 TABLET BY MOUTH DAILY. 03/31/21   Minna Merritts, MD  apixaban (ELIQUIS) 5 MG TABS tablet Take 1 tablet (5 mg total) by mouth 2 (two) times daily. Please come to office for lab draw for Eliquis refills. 12/14/21   Minna Merritts, MD  atorvastatin (LIPITOR) 40 MG tablet TAKE 1 TABLET BY MOUTH DAILY 09/22/21   [name], MD  calcium carbonate (OS-CAL - DOSED IN MG OF ELEMENTAL CALCIUM) 1250 (500 Ca) MG tablet Take 1 tablet by mouth daily with lunch.    [provider]  cetirizine (ZYRTEC) 10 MG tablet Take 10 mg by mouth daily.    [provider]  diphenhydrAMINE (SOMINEX) 25 MG tablet Take by mouth. Patient not taking: Reported on 11/06/2021    [provider]  donepezil (ARICEPT) 10 MG tablet Take 1 tablet by mouth at bedtime. 09/25/21   [provider]  fenofibrate 160 MG tablet TAKE ONE TABLET AT BEDTIME 09/22/21   Minna Merritts, MD  ferrous sulfate 325 (65  FE) MG tablet Take by mouth.    [provider]  FLUoxetine (PROZAC) 20 MG capsule TAKE 1 CAPSULE BY MOUTH ONCE DAILY 08/24/21   [name], MD  furosemide (LASIX) 40 MG tablet Take 1 tablet (40 mg total) by mouth every other day. Patient not taking: Reported on 10/06/2021 05/05/21   Minna Merritts, MD  gabapentin (NEURONTIN) 300 MG capsule TAKE 1 CAPSULE BY MOUTH 3 TIMES DAILY. 07/09/21   [name], MD  HYDROcodone-acetaminophen (NORCO/VICODIN) 5-325 MG tablet Take 1 tablet by mouth 4 (four) times daily as needed for moderate pain.    [provider]  hydrocortisone cream 1 % Apply 1 application topically daily as needed for itching.    [provider]  hydroxychloroquine (PLAQUENIL) 200 MG tablet TAKE 1 TABLET BY MOUTH DAILY Patient taking differently: 3 (three) times a week. 06/26/21   [name], MD  lisinopril (ZESTRIL) 20 MG tablet daily. 03/08/16   [provider]  loperamide (IMODIUM) 2 MG capsule Take 4 mg by mouth in the morning and at bedtime.    [provider]  Multiple Vitamins-Minerals (PRESERVISION AREDS 2) CAPS Take 1 capsule by mouth 2 (two) times daily.    [provider]  omeprazole (PRILOSEC) 40 MG capsule TAKE 1 CAPSULE EVERY DAY 06/25/21   Jonathon Bellows, MD  traZODone (DESYREL) 50 MG tablet TAKE 1 OR 2 TABLETS BY MOUTH AT BEDTIME AS NEEDED. 08/05/21   Dutch Quint B, FNP  vitamin B-12 (CYANOCOBALAMIN) 1000 MCG tablet Take 1,000 mcg by mouth daily with lunch.     [provider]  Vitamin D, Ergocalciferol, (DRISDOL) 1.25 MG (50000 UNIT) CAPS capsule Take 50,000 Units by mouth once a week. 04/28/20   [provider]  vitamin E 180 MG (400 UNITS) capsule Take 800 Units by mouth daily with lunch.    [provider]    Physical Exam: Vitals:   12/15/21 0355 12/15/21 0600 12/15/21 0700 12/15/21 0847  BP: (!) 141/89 (!) 160/81 (!) 168/68   Pulse: 69 65 66   Resp: 18 18 (!) 22    Temp: 98.2 F (36.8 C)  98.2 F (36.8 C)  TempSrc: Oral   Oral  SpO2: 100% 98% 97%   Weight:      Height:       Physical Exam Vitals and nursing note reviewed.  Constitutional:      Appearance: Normal appearance.     Comments: Dressing over the right ear soaked with blood.  Ecchymosis over the right side of the forehead  HENT:     Head: Normocephalic and atraumatic.     Nose: Nose normal.     Mouth/Throat:     Mouth: Mucous membranes are moist.  Eyes:     Comments: Pale conjunctiva  Cardiovascular:     Rate and Rhythm: Normal rate and regular rhythm.  Pulmonary:     Effort: Pulmonary effort is normal.     Breath sounds: Normal breath sounds.  Abdominal:     General: Abdomen is flat. Bowel sounds are normal.     Palpations: Abdomen is soft.  Musculoskeletal:        General: Normal range of motion.     Cervical back: Normal range of motion and neck supple.  Skin:    Comments: Ecchymosis over right elbow and knee  Neurological:     General: No focal deficit present.     Mental Status: He is alert and oriented to person, place, and time.  Psychiatric:        Mood and Affect: Mood normal.        Behavior: Behavior normal.     Data Reviewed: Relevant notes from primary care and specialist visits, past discharge summaries as available in EHR, including Care Everywhere. Prior diagnostic testing as pertinent to current admission diagnoses Updated medications and problem lists for reconciliation ED course, including vitals, labs, imaging, treatment and response to treatment Triage notes, nursing and pharmacy notes and ED provider's notes Notable results as noted in HPI Labs reviewed.  Sodium 135, potassium 5.3, chloride 102, bicarb 25, glucose 118, BUN 36, creatinine 1.35, calcium 9.0, total protein 7.6, albumin 4.1, AST 29, ALT 17, alk phos 91, total bilirubin 0.6, white count 5.3, hemoglobin 10.2, hematocrit 32.9, platelet count 115 CT scan of the head without contrast  shows no CT evidence for acute intracranial abnormality. Atrophy and chronic small vessel ischemic changes of the white matter. Large right parietal scalp laceration and hematoma No definite acute osseous abnormality of the cervical spine. Right elbow x-ray shows Posterior soft tissue hematoma with no acute fracture or dislocation identified about the right elbow. Twelve-lead EKG reviewed by me shows A-fib with PVCs.  Left axis deviation.  LVH. There are no new results to review at this time.  Assessment and Plan: Fall Status post fall which appears to be mechanical Patient has a history of recent total right knee arthroplasty and states that his knee gave way and he fell Place patient on fall precautions Consult PT  Complex laceration of right ear Status post fall with complex laceration of the right ear ENT consult has been placed Hold apixaban for planned surgical repair  Pancytopenia ([place]) Patient noted to have pancytopenia related to chronic disease Hemoglobin was 10 g/dl upon presentation but patient has had a lot of blood loss from his right ear laceration Monitor closely during this hospitalization and transfuse as needed  Essential hypertension Continue amlodipine Hold lisinopril due to hyperkalemia  Atrial fibrillation, chronic ([place]) Patient remains in A-fib but is rate controlled Hold Eliquis for planned procedure  Depression Stable Continue fluoxetine and trazodone  Hyperkalemia Related to  ACE inhibitor use Hold lisinopril Treat with dextrose and insulin      Advance Care Planning:   Code Status: Full Code   Consults: ENT  Family Communication: Greater than 50% of time was spent discussing patient's condition and plan of care with patient and his sister at the bedside.  All questions and concerns were addressed.  They verbalized understanding and agree with the plan.  Severity of Illness: The appropriate patient status for this patient is OBSERVATION.  Observation status is judged to be reasonable and necessary in order to provide the required intensity of service to ensure the patient's safety. The patient's presenting symptoms, physical exam findings, and initial radiographic and laboratory data in the context of their medical condition is felt to place them at decreased risk for further clinical deterioration. Furthermore, it is anticipated that the patient will be medically stable for discharge from the hospital within 2 midnights of admission.   Author: Collier Bullock, MD 12/15/2021 9:11 AM  For on call review [email or web address].

## 2021-12-15 NOTE — Assessment & Plan Note (Signed)
Status post fall which appears to be mechanical Patient has a history of recent total right knee arthroplasty and states that his knee gave way and he fell Place patient on fall precautions Consult PT

## 2021-12-15 NOTE — Assessment & Plan Note (Signed)
Continue amlodipine Hold lisinopril due to hyperkalemia

## 2021-12-15 NOTE — Transfer of Care (Signed)
Immediate Anesthesia Transfer of Care Note  Patient: Bryan [name]  Procedure(s) Performed: Right auricle repair (Right)  Patient Location: PACU  Anesthesia Type:General  Level of Consciousness: patient cooperative  Airway & Oxygen Therapy: Patient Spontanous Breathing and Patient connected to face mask oxygen  Post-op Assessment: Report given to RN and Post -op Vital signs reviewed and stable  Post vital signs: Reviewed and stable  Last Vitals:  Vitals Value Taken Time  BP 100/75 12/15/21 1915  Temp 36.1 C 12/15/21 1915  Pulse 84 12/15/21 1927  Resp 16 12/15/21 1927  SpO2 95 % 12/15/21 1927  Vitals shown include unvalidated device data.  Last Pain:  Vitals:   12/15/21 1915  TempSrc:   PainSc: 0-No pain         Complications: No notable events documented.

## 2021-12-16 ENCOUNTER — Encounter: Payer: Self-pay | Admitting: Otolaryngology

## 2021-12-16 NOTE — Anesthesia Postprocedure Evaluation (Signed)
Anesthesia Post Note  Patient: Bryan [name]  Procedure(s) Performed: Right auricle repair (Right)  Patient location during evaluation: PACU Anesthesia Type: General Level of consciousness: awake and alert Pain management: pain level controlled Vital Signs Assessment: post-procedure vital signs reviewed and stable Respiratory status: spontaneous breathing, nonlabored ventilation, respiratory function stable and patient connected to nasal cannula oxygen Cardiovascular status: blood pressure returned to baseline and stable Postop Assessment: no apparent nausea or vomiting Anesthetic complications: no   No notable events documented.   Last Vitals:  Vitals:   12/15/21 1928 12/15/21 1929  BP:    Pulse: 89 79  Resp: 19   Temp:    SpO2: 96% 98%    Last Pain:  Vitals:   12/15/21 1915  TempSrc:   PainSc: 0-No pain                 Arita Miss

## 2021-12-17 ENCOUNTER — Telehealth: Payer: Self-pay

## 2021-12-17 NOTE — Telephone Encounter (Signed)
Transition Care Management Follow-up Telephone Call Date of discharge and from where: 12/15/21 [facility] How have you been since you were released from the hospital? Denies falls since discharge, dizziness, blurred vision, chest pain, n/v/d and all other harmful symptoms. Chronic back pain managed with medication as directed. Bandage changed by [facility]NT today, good report.  Any questions or concerns? No  Items Reviewed: Did the pt receive and understand the discharge instructions provided? Yes  Medications obtained and verified? Yes  Any new allergies since your discharge? No  Dietary orders reviewed? Yes Do you have support at home? Yes   Home Care and Equipment/Supplies: Were home health services ordered? Yes. HHPT in progress.   Functional Questionnaire: (I = Independent and D = Dependent) ADLs: I  Bathing/Dressing- I  Meal Prep- I  Eating- I  Maintaining continence- I  Transferring/Ambulation- [place]  Managing Meds- I  Follow up appointments reviewed:  PCP Hospital f/u appt confirmed? Yes  Scheduled to see PCP on 12/21/21 @ 11:00. [place] [facility] f/u appt confirmed? Yes  Scheduled to see [facility] on 12/22/21 @ 1:15. Are transportation arrangements needed? No  If their condition worsens, is the pt aware to call PCP or go to the Emergency Dept.? Yes Was the patient provided with contact information for the PCP's office or ED? Yes Was to pt encouraged to call back with questions or concerns? Yes

## 2021-12-18 NOTE — Telephone Encounter (Signed)
Reviewed

## 2021-12-21 ENCOUNTER — Ambulatory Visit (INDEPENDENT_AMBULATORY_CARE_PROVIDER_SITE_OTHER): Payer: Medicare (Managed Care)

## 2021-12-21 ENCOUNTER — Encounter: Payer: Self-pay | Admitting: Family Medicine

## 2021-12-21 ENCOUNTER — Ambulatory Visit (INDEPENDENT_AMBULATORY_CARE_PROVIDER_SITE_OTHER): Payer: Medicare (Managed Care) | Admitting: Family Medicine

## 2021-12-21 ENCOUNTER — Other Ambulatory Visit: Payer: Self-pay | Admitting: Cardiovascular Disease

## 2021-12-21 VITALS — BP 118/80 | HR 85 | Temp 98.1°F | Ht 72.0 in | Wt 151.4 lb

## 2021-12-21 DIAGNOSIS — M549 Dorsalgia, unspecified: Secondary | ICD-10-CM

## 2021-12-21 DIAGNOSIS — M546 Pain in thoracic spine: Secondary | ICD-10-CM | POA: Diagnosis not present

## 2021-12-21 DIAGNOSIS — S01311D Laceration without foreign body of right ear, subsequent encounter: Secondary | ICD-10-CM

## 2021-12-21 DIAGNOSIS — W19XXXD Unspecified fall, subsequent encounter: Secondary | ICD-10-CM | POA: Diagnosis not present

## 2021-12-21 DIAGNOSIS — M545 Low back pain, unspecified: Secondary | ICD-10-CM | POA: Diagnosis not present

## 2021-12-21 NOTE — Assessment & Plan Note (Signed)
Appears well-healing without signs of infection.  He will see ENT tomorrow for removal of sutures.  Discussed hematoma and bruising will take many weeks to completely resolve.

## 2021-12-21 NOTE — Assessment & Plan Note (Signed)
Ongoing since his fall.  Likely muscular strain though given his tenderness and his history of back surgery we will get thoracic and lumbar spine plain films to evaluate for any acute bony issues.

## 2021-12-21 NOTE — Patient Instructions (Signed)
Nice to see you. We will get x-rays today. Please make sure you see ENT tomorrow as scheduled. If you do not hear from your home health PT this week please let us know.

## 2021-12-21 NOTE — Progress Notes (Signed)
[name], MD Phone: [phone]  Bryan [name] is a 81 y.o. male who presents today for hospital follow-up.  Fall/ear laceration: Patient notes he fell after turning his bedroom light out and turning to go to bed then his right knee gave out on him.  He notes the room was still lit with a bedside lamp.  He had a knee replacement in that knee relatively recently.  He fell and hit the right side of his head on the door jam and lacerated his auricle and had a scalp laceration.  There is an underlying hematoma at the site of the scalp laceration.  He underwent surgical repair of the ear with ENT.  He feels as though this is all healing well and he sees ENT tomorrow for removal of the sutures.  He does have extensive bruising over his face.  He notes PT is supposed to start balance therapy this week.  He notes some back pain related to his fall.  He notes numerous back surgeries in the past.  He had a right elbow x-ray that was negative.  He notes no radiation of the back pain.  No new numbness.  No new weakness.  His pain is controlled with his chronic pain medications.  Social History   Tobacco Use  Smoking Status Former   Packs/day: 1.00   Years: 32.00   Total pack years: 32.00   Types: Cigarettes   Quit date: 07/05/1981   Years since quitting: 40.4  Smokeless Tobacco Never    Current Outpatient Medications on File Prior to Visit  Medication Sig Dispense Refill   apixaban (ELIQUIS) 5 MG TABS tablet Take 1 tablet (5 mg total) by mouth 2 (two) times daily. Please come to office for lab draw for Eliquis refills. 60 tablet 0   atorvastatin (LIPITOR) 40 MG tablet TAKE 1 TABLET BY MOUTH DAILY 90 tablet 3   calcium carbonate (OS-CAL - DOSED IN MG OF ELEMENTAL CALCIUM) 1250 (500 Ca) MG tablet Take 1 tablet by mouth daily with lunch.     donepezil (ARICEPT) 10 MG tablet Take 1 tablet by mouth at bedtime.     fenofibrate 160 MG tablet TAKE ONE TABLET AT BEDTIME 90 tablet 3   ferrous  sulfate 325 (65 FE) MG tablet Take by mouth.     FLUoxetine (PROZAC) 20 MG capsule TAKE 1 CAPSULE BY MOUTH ONCE DAILY 90 capsule 1   gabapentin (NEURONTIN) 300 MG capsule TAKE 1 CAPSULE BY MOUTH 3 TIMES DAILY. 270 capsule 0   HYDROcodone-acetaminophen (NORCO) 10-325 MG tablet Take 1 tablet by mouth 4 (four) times daily as needed for moderate pain.     hydrocortisone cream 1 % Apply 1 application topically daily as needed for itching.     lisinopril (ZESTRIL) 20 MG tablet Take 30 mg by mouth daily.     loperamide (IMODIUM) 2 MG capsule Take 4 mg by mouth in the morning and at bedtime.     Multiple Vitamins-Minerals (PRESERVISION AREDS 2) CAPS Take 1 capsule by mouth daily.     omeprazole (PRILOSEC) 40 MG capsule TAKE 1 CAPSULE EVERY DAY 90 capsule 3   vitamin B-12 (CYANOCOBALAMIN) 1000 MCG tablet Take 1,000 mcg by mouth daily with lunch.      Vitamin D, Ergocalciferol, (DRISDOL) 1.25 MG (50000 UNIT) CAPS capsule Take 50,000 Units by mouth once a week.     vitamin E 180 MG (400 UNITS) capsule Take 800 Units by mouth daily with lunch.     Current Facility-Administered  Medications on File Prior to Visit  Medication Dose Route Frequency Provider Last Rate Last Admin   0.9 %  sodium chloride infusion   Intravenous Continuous Lequita Asal, MD 10 mL/hr at 12/11/19 1123 New Bag at 12/11/19 1123     ROS see history of present illness  Objective  Physical Exam Vitals:   12/21/21 1117  BP: 118/80  Pulse: 85  Temp: 98.1 F (36.7 C)  SpO2: 93%    BP Readings from Last 3 Encounters:  12/21/21 118/80  12/15/21 117/73  12/03/21 (!) 116/57   Wt Readings from Last 3 Encounters:  12/21/21 151 lb 6.4 oz (68.7 kg)  12/15/21 163 lb 2.3 oz (74 kg)  11/06/21 163 lb 6.4 oz (74.1 kg)    Physical Exam Constitutional:      General: He is not in acute distress.    Appearance: He is not diaphoretic.  Cardiovascular:     Rate and Rhythm: Normal rate and regular rhythm.     Heart sounds:  Normal heart sounds.  Pulmonary:     Effort: Pulmonary effort is normal.     Breath sounds: Normal breath sounds.  Musculoskeletal:     Comments: Patient has some midline thoracic spine tenderness, no muscular back tenderness  Skin:    General: Skin is warm and dry.  Neurological:     Mental Status: He is alert.     Comments: CN 3-12 intact, 5/5 strength in bilateral biceps, triceps, grip, quads, hamstrings, plantar and dorsiflexion, sensation to light touch intact in bilateral UE and LE       Elbow has minimal tenderness over the lateral aspect  Assessment/Plan: Please see individual problem list.  Problem List Items Addressed This Visit     Acute bilateral back pain - Primary    Ongoing since his fall.  Likely muscular strain though given his tenderness and his history of back surgery we will get thoracic and lumbar spine plain films to evaluate for any acute bony issues.      Relevant Orders   DG Thoracic Spine 2 View   DG Lumbar Spine Complete   Complex laceration of right ear    Appears well-healing without signs of infection.  He will see ENT tomorrow for removal of sutures.  Discussed hematoma and bruising will take many weeks to completely resolve.      Fall    Fall seems to have been mechanical though certainly there is likely a issue with balance that is playing a role as well.  Discussed the need for PT.  They will contact us if they do not hear from home health PT this week.        Return in about 6 weeks (around 02/01/2022) for Falls follow-up.   [name], MD [place]

## 2021-12-21 NOTE — Assessment & Plan Note (Signed)
Fall seems to have been mechanical though certainly there is likely a issue with balance that is playing a role as well.  Discussed the need for PT.  They will contact us if they do not hear from home health PT this week.

## 2021-12-22 ENCOUNTER — Telehealth: Payer: Self-pay

## 2021-12-22 DIAGNOSIS — S22050A Wedge compression fracture of T5-T6 vertebra, initial encounter for closed fracture: Secondary | ICD-10-CM

## 2021-12-22 NOTE — Telephone Encounter (Signed)
I received a call report from [facility] at Radiology and he stated that the patient has a new compression deformity of T5 when compared to the 10/2019 exam for correction for acuity.  See report in the chart.  Bryan [name],cma

## 2021-12-22 NOTE — Telephone Encounter (Signed)
Patient called back and I informed him of his xray results ad he understood, patient stated he has metal in his back from prior surgery.  Patient would like to be referred back to his specialist in [place] Dr. Lorain Childes.  at [facility]. [place] Folden,cma

## 2021-12-22 NOTE — Telephone Encounter (Signed)
Noted. Please let the patient know that he has a new compression fracture in his spine. I would suggest he see his spine specialist to see if anything needs to be done for this. We could also arrange for an MRI to further characterize this compression fracture. If he is ok with that please find out if he has a pacemaker or any metal in his body. 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

## 2021-12-22 NOTE — Telephone Encounter (Signed)
LVM for patient to call back.   Jeneane Pieczynski,cma  

## 2021-12-22 NOTE — Telephone Encounter (Signed)
I called and LVM for patient to call back.  Bryan [name],cma

## 2021-12-22 NOTE — Telephone Encounter (Signed)
Referral placed to Dr Saintclair Halsted. The hardware in his back may make it difficult for the MRI to be accurate. I will just have him see Dr Saintclair Halsted to determine the next steps. He does need a bone density scan given this fracture. I ordered this and he can call [phone] to get this scheduled.

## 2021-12-23 ENCOUNTER — Encounter: Payer: Self-pay | Admitting: Family Medicine

## 2021-12-23 ENCOUNTER — Telehealth: Payer: Self-pay | Admitting: Family Medicine

## 2021-12-23 DIAGNOSIS — I10 Essential (primary) hypertension: Secondary | ICD-10-CM | POA: Diagnosis not present

## 2021-12-23 DIAGNOSIS — K219 Gastro-esophageal reflux disease without esophagitis: Secondary | ICD-10-CM | POA: Diagnosis not present

## 2021-12-23 DIAGNOSIS — J301 Allergic rhinitis due to pollen: Secondary | ICD-10-CM | POA: Diagnosis not present

## 2021-12-23 DIAGNOSIS — M069 Rheumatoid arthritis, unspecified: Secondary | ICD-10-CM | POA: Diagnosis not present

## 2021-12-23 DIAGNOSIS — Z952 Presence of prosthetic heart valve: Secondary | ICD-10-CM | POA: Diagnosis not present

## 2021-12-23 DIAGNOSIS — D509 Iron deficiency anemia, unspecified: Secondary | ICD-10-CM | POA: Diagnosis not present

## 2021-12-23 DIAGNOSIS — G4700 Insomnia, unspecified: Secondary | ICD-10-CM | POA: Diagnosis not present

## 2021-12-23 DIAGNOSIS — E7800 Pure hypercholesterolemia, unspecified: Secondary | ICD-10-CM | POA: Diagnosis not present

## 2021-12-23 DIAGNOSIS — Z7901 Long term (current) use of anticoagulants: Secondary | ICD-10-CM | POA: Diagnosis not present

## 2021-12-23 DIAGNOSIS — Z96651 Presence of right artificial knee joint: Secondary | ICD-10-CM | POA: Diagnosis not present

## 2021-12-23 DIAGNOSIS — Z471 Aftercare following joint replacement surgery: Secondary | ICD-10-CM | POA: Diagnosis not present

## 2021-12-23 DIAGNOSIS — Z8781 Personal history of (healed) traumatic fracture: Secondary | ICD-10-CM | POA: Diagnosis not present

## 2021-12-23 DIAGNOSIS — Z9181 History of falling: Secondary | ICD-10-CM | POA: Diagnosis not present

## 2021-12-23 NOTE — Telephone Encounter (Signed)
I called nad spoke with the patient and informed him that the provider put I  his referral to Dr. Saintclair Halsted and I informed him he can call their office and schedule and I also inforemed him to call for a bone density scan and he took the number down and will call them. Zelig Gacek,cma

## 2021-12-23 NOTE — Telephone Encounter (Signed)
Pt called in requesting blood type information... Pt is requesting callback

## 2021-12-24 NOTE — Telephone Encounter (Signed)
I called the patient and informed him that we do not have the information requested and that our labs does not do that here because it is  so expensive.  Bryan [name],cma

## 2021-12-28 ENCOUNTER — Telehealth: Payer: Self-pay

## 2021-12-28 ENCOUNTER — Ambulatory Visit
Admission: RE | Admit: 2021-12-28 | Discharge: 2021-12-28 | Disposition: A | Discharge status: Home / Self Care | Payer: Medicare (Managed Care) | Source: Ambulatory Visit | Attending: Family Medicine | Admitting: Family Medicine

## 2021-12-28 DIAGNOSIS — S22050A Wedge compression fracture of T5-T6 vertebra, initial encounter for closed fracture: Secondary | ICD-10-CM | POA: Insufficient documentation

## 2021-12-28 DIAGNOSIS — M810 Age-related osteoporosis without current pathological fracture: Secondary | ICD-10-CM | POA: Diagnosis not present

## 2021-12-28 NOTE — Telephone Encounter (Signed)
Patient states he did have his bone density test today.  Patient states the knot on the right side of his head, which he received when he fell, is not going down.  Patient states he tried putting ice on it, but it didn't seem to help.  Patient states he would like to know if he should continue to apply ice to the knot.

## 2021-12-29 ENCOUNTER — Encounter: Payer: Self-pay | Admitting: Family Medicine

## 2021-12-29 NOTE — Telephone Encounter (Signed)
Patient is scheduled for tomorrow to reevaluate this area.

## 2021-12-30 ENCOUNTER — Ambulatory Visit (INDEPENDENT_AMBULATORY_CARE_PROVIDER_SITE_OTHER): Payer: Medicare (Managed Care) | Admitting: Family Medicine

## 2021-12-30 ENCOUNTER — Encounter: Payer: Self-pay | Admitting: Family Medicine

## 2021-12-30 VITALS — BP 130/70 | HR 79 | Temp 98.0°F | Ht 72.0 in | Wt 154.0 lb

## 2021-12-30 DIAGNOSIS — T148XXA Other injury of unspecified body region, initial encounter: Secondary | ICD-10-CM | POA: Diagnosis not present

## 2021-12-30 DIAGNOSIS — M8000XD Age-related osteoporosis with current pathological fracture, unspecified site, subsequent encounter for fracture with routine healing: Secondary | ICD-10-CM | POA: Diagnosis not present

## 2021-12-30 DIAGNOSIS — J3489 Other specified disorders of nose and nasal sinuses: Secondary | ICD-10-CM | POA: Diagnosis not present

## 2021-12-30 DIAGNOSIS — J309 Allergic rhinitis, unspecified: Secondary | ICD-10-CM | POA: Diagnosis not present

## 2021-12-30 MED ORDER — IPRATROPIUM BROMIDE 0.06 % NA SOLN: 2.0000 | Freq: Four times a day (QID) | NASAL | 12 refills | Status: DC

## 2021-12-30 NOTE — Patient Instructions (Signed)
Nice to see you. We will get labs today and contact you with the results. I will base the ability to start fosamax off of your lab results.

## 2021-12-31 DIAGNOSIS — T148XXA Other injury of unspecified body region, initial encounter: Secondary | ICD-10-CM | POA: Insufficient documentation

## 2021-12-31 DIAGNOSIS — J3489 Other specified disorders of nose and nasal sinuses: Secondary | ICD-10-CM | POA: Insufficient documentation

## 2021-12-31 LAB — BASIC METABOLIC PANEL
BUN: 17 mg/dL (ref 6–23)
CO2: 24 mEq/L (ref 19–32)
Calcium: 9 mg/dL (ref 8.4–10.5)
Chloride: 97 mEq/L (ref 96–112)
Creatinine, Ser: 0.93 mg/dL (ref 0.40–1.50)
GFR: 75.93 mL/min (ref >60.00)
Glucose, Bld: 105 mg/dL — ABNORMAL HIGH (ref 70–99)
Potassium: 4.5 mEq/L (ref 3.5–5.1)
Sodium: 136 mEq/L (ref 135–145)

## 2021-12-31 LAB — VITAMIN D 25 HYDROXY (VIT D DEFICIENCY, FRACTURES): VITD: 18.83 ng/mL — ABNORMAL LOW (ref 30.00–100.00)

## 2021-12-31 NOTE — Discharge Summary (Signed)
Bryan [name], Bryan [name] [id number] 05/25/1940 No att. providers found  Discharge summary.  HPI: The patient was admitted through the emergency room for observation while an operating room was made available for repairing a traumatic laceration of his right auricle.  He was brought to the operating room and given general anesthesia.  Repair of the right auricle was done.  He was then discharged home as an outpatient.  Allergies:  Allergies  Allergen Reactions   Penicillins Hives, Swelling and Other (See Comments)    SWELLING REACTION UNSPECIFIED PATIENT HAS TAKEN AMOXICILLIN ON MED HX FROM [facility] PATIENT HAS HAD A PCN REACTION WITH IMMEDIATE RASH, FACIAL/TONGUE/THROAT SWELLING, SOB, OR LIGHTHEADEDNESS WITH HYPOTENSION:  #  #  #  YES  #  #  #   Has patient had a PCN reaction causing severe rash involving mucus membranes or skin necrosis: No Has patient had a PCN reaction that required hospitalization No Has patient had a PCN reaction occurring within the last 10 years: No   Demerol [Meperidine] Hives and Nausea And Vomiting   Other     ROS: Review of systems normal other than 12 systems except per HPI.  PMH:  Past Medical History:  Diagnosis Date   Abnormal CT scan    Asymmetric left rectal wall thickening    Anemia    Anxiety    Arrhythmia    Basal cell carcinoma 04/28/2020   L forehead - ED&C 06/17/2020   Basal cell carcinoma 05/06/2021   left scalp postauricular ant, EDC   Basal cell carcinoma 05/06/2021   left scalp postauricular post, EDC   CHF (congestive heart failure) ([place])    Chronic lower back pain    Complication of anesthesia    Memory loss 09/2015   Coronary artery disease    Depression    GERD (gastroesophageal reflux disease)    Glaucoma    H/O thoracic aortic aneurysm repair    Headache    Heart murmur    History of being hospitalized    memory lose kidney funtion down blood pressure up   History of blood transfusion    "think he had one when he had heart  valve OR" (10/14/2016); "none since" (10/19/2017)   History of chicken pox    Hyperlipidemia    Hypertension    Lumbar stenosis    Murmur    Neuropathy    Osteoarthritis    worse in feet and ankles   Paroxysmal A-fib (HCC)    Poor short term memory    takes Aricept   Rheumatoid arthritis ([place])    "all over" (10/14/2016)   Schizophrenia ([place])    Seasonal allergies    Thoracic spine pain    Valvular heart disease    Wears glasses    reading    FH:  Family History  Problem Relation Age of Onset   Heart failure Mother    Hypertension Mother    Asthma Mother    Osteoporosis Mother    Heart attack Father 22       MI   Hypertension Sister    Prostate cancer Neg Hx    Bladder Cancer Neg Hx    Kidney cancer Neg Hx    Colon cancer Neg Hx     SH:  Social History   Socioeconomic History   Marital status: Widowed    Spouse name: Not on file   Number of children: Not on file   Years of education: Not on file   Highest education level: Not  on file  Occupational History   Occupation: retired  Tobacco Use   Smoking status: Former    Packs/day: 1.00    Years: 32.00    Total pack years: 32.00    Types: Cigarettes    Quit date: 07/05/1981    Years since quitting: 40.5   Smokeless tobacco: Never  Vaping Use   Vaping Use: Never used  Substance and Sexual Activity   Alcohol use: Not Currently    Comment: "glass of wine/month; if that"   Drug use: Never   Sexual activity: Not Currently  Other Topics Concern   Not on file  Social History Narrative   Not on file   Social Determinants of Health   Financial Resource Strain: Low Risk  (02/06/2021)   Overall Financial Resource Strain (CARDIA)    Difficulty of Paying Living Expenses: Not hard at all  Food Insecurity: No Food Insecurity (02/06/2021)   Hunger Vital Sign    Worried About Running Out of Food in the Last Year: Never true    [place] in the Last Year: Never true  Transportation Needs: No Transportation  Needs (02/06/2021)   PRAPARE - Hydrologist (Medical): No    Lack of Transportation (Non-Medical): No  Physical Activity: Insufficiently Active (02/06/2021)   Exercise Vital Sign    Days of Exercise per Week: 2 days    Minutes of Exercise per Session: 30 min  Stress: No Stress Concern Present (02/06/2021)   [place]    Feeling of Stress : Not at all  Social Connections: Unknown (02/06/2021)   Social Connection and Isolation Panel [NHANES]    Frequency of Communication with Friends and Family: More than three times a week    Frequency of Social Gatherings with Friends and Family: More than three times a week    Attends Religious Services: More than 4 times per year    Active Member of [employer] or Organizations: Yes    Attends Archivist Meetings: More than 4 times per year    Marital Status: Not on file  Intimate Partner Violence: Not At Risk (02/06/2021)   Humiliation, Afraid, Rape, and Kick questionnaire    Fear of Current or Ex-Partner: No    Emotionally Abused: No    Physically Abused: No    Sexually Abused: No    PSH:  Past Surgical History:  Procedure Laterality Date   AORTIC VALVE REPLACEMENT  2007   [place].  [place], [place]; "pig valve"   APPENDECTOMY  2010   BACK SURGERY     CARDIAC VALVE REPLACEMENT     CATARACT EXTRACTION W/ INTRAOCULAR LENS  IMPLANT, BILATERAL Bilateral 2012   COLONOSCOPY WITH PROPOFOL N/A 09/24/2016   Procedure: COLONOSCOPY WITH PROPOFOL;  Surgeon: Jonathon Bellows, MD;  Location: [facility] ENDOSCOPY;  Service: Endoscopy;  Laterality: N/A;   ESOPHAGOGASTRODUODENOSCOPY (EGD) WITH PROPOFOL N/A 09/24/2016   Procedure: ESOPHAGOGASTRODUODENOSCOPY (EGD) WITH PROPOFOL;  Surgeon: Jonathon Bellows, MD;  Location: [facility] ENDOSCOPY;  Service: Endoscopy;  Laterality: N/A;   GIVENS CAPSULE STUDY N/A 09/24/2016   Procedure: GIVENS CAPSULE STUDY;  Surgeon: Jonathon Bellows, MD;   Location: [facility] ENDOSCOPY;  Service: Endoscopy;  Laterality: N/A;   HAMMER TOE SURGERY Right 10/09/2015   Procedure: HAMMER TOE REPAIR WITH K-WIRE FIXATION RIGHT SECOND TOE;  Surgeon: Albertine Patricia, DPM;  Location: [place];  Service: Podiatry;  Laterality: Right;  WITH LOCAL   HARDWARE REMOVAL N/A 11/02/2019  Procedure: Removal of hardware thoracic spine;  Surgeon: Kary Kos, MD;  Location: [place];  Service: Neurosurgery;  Laterality: N/A;  posterior   INGUINAL HERNIA REPAIR Right 05/05/2018   Medium Bard PerFix plug.  Surgeon: Robert Bellow, MD;  Location: [facility] ORS;  Service: General;  Laterality: Right;   IR VERTEBROPLASTY CERV/THOR BX INC UNI/BIL INC/INJECT/IMAGING  10/27/2018   JOINT REPLACEMENT Left    left total knee   LAMINECTOMY WITH POSTERIOR LATERAL ARTHRODESIS LEVEL 3 N/A 09/28/2017   Procedure: LUMBAR  POSTEROR  FUSION REVISION - LUMBAR ONE -TWO, LUMBAR TWO -THREE,THREE-FOUR ,BILATERAL ARTHRODESIS REMOVAL LUMBAR THREE HARDWARE;  Surgeon: Kary Kos, MD;  Location: [place];  Service: Neurosurgery;  Laterality: N/A;   LAMINECTOMY WITH POSTERIOR LATERAL ARTHRODESIS LEVEL 3 N/A 10/20/2017   Procedure: Revision fusion and removal of hardware Lumbar one, Pedicle screw fixation thoracic ten-lumbar two, thoracic nine-ten laminotomy, Posterior Lumbar Arthrodesis thoracic ten-lumbar two ;  Surgeon: Kary Kos, MD;  Location: [place];  Service: Neurosurgery;  Laterality: N/A;   LAPAROSCOPIC CHOLECYSTECTOMY  2010   LUMBAR FUSION Right 06/08/2017   LUMBAR THREE-FOUR, LUMBAR FOUR-FIVE POSTEROLATERAL ARTHRODESIS WITH RIGHT LUMBAR FOUR-FIVE LAMINECTOMY/FORAMINOTOMY   LUMBAR LAMINECTOMY/DECOMPRESSION MICRODISCECTOMY Right 03/08/2016   Procedure: Laminectomy and Foraminotomy - Lumbar Five-Sacral One Right;  Surgeon: Kary Kos, MD;  Location: [place];  Service: Neurosurgery;  Laterality: Right;  Right   MULTIPLE TOOTH EXTRACTIONS     "3"   OTOPLASATY Right 12/15/2021   Procedure: Right  auricle repair;  Surgeon: Margaretha Sheffield, MD;  Location: [facility] ORS;  Service: ENT;  Laterality: Right;   POSTERIOR LUMBAR FUSION  10/14/2016   L5-S1   REPLACEMENT TOTAL KNEE Left 2003   THORACIC AORTIC ANEURYSM REPAIR  2010   TOTAL KNEE ARTHROPLASTY Right 08/2021    Physical  Exam: The patient was awake and alert and feeling well.  He had a bandage on his right ear where the laceration repair was performed.   A/P: Patient had traumatic laceration of the right ear from a fall.  He did not lose consciousness.  He had a complex laceration that required a complex repair under anesthesia in the operating room.  He will follow-up in the office in 1 week.  Will remove sutures at that time.   Elon Alas Nikitha Mode 12/31/2021 12:05 PM

## 2021-12-31 NOTE — Progress Notes (Addendum)
Bryan Rumps, MD Phone: [phone]  Bryan [name] is a 81 y.o. male who presents today for follow-up.  Subcutaneous hematoma: Patient has a hematoma on the right side of his head just above his ear.  This occurred when he had a fall that lacerated a significant portion of his ear.  He has some headache surrounding the hematoma though no global headaches.  He notes that hematoma has not enlarged.  No numbness, weakness, or vision changes.  Ice has not helped.  The patient does take narcotic pain medicine chronically.  He sees ENT next week.  Rhinorrhea: Patient notes chronic issues with rhinorrhea.  He sneezes some and has some postnasal drip.  No significant congestion.  He takes phenylephrine on a daily basis.  He also takes Claritin with little benefit.  Osteoporosis: Patient reports has been on Fosamax in the past.  He has no swallowing issues.  No history of stomach or esophageal ulcers.  He does take vitamin D.  Social History   Tobacco Use  Smoking Status Former   Packs/day: 1.00   Years: 32.00   Total pack years: 32.00   Types: Cigarettes   Quit date: 07/05/1981   Years since quitting: 40.5  Smokeless Tobacco Never    Current Outpatient Medications on File Prior to Visit  Medication Sig Dispense Refill   amLODipine (NORVASC) 5 MG tablet TAKE 1 TABLET BY MOUTH DAILY. 90 tablet 1   apixaban (ELIQUIS) 5 MG TABS tablet Take 1 tablet (5 mg total) by mouth 2 (two) times daily. Please come to office for lab draw for Eliquis refills. 60 tablet 0   atorvastatin (LIPITOR) 40 MG tablet TAKE 1 TABLET BY MOUTH DAILY 90 tablet 3   calcium carbonate (OS-CAL - DOSED IN MG OF ELEMENTAL CALCIUM) 1250 (500 Ca) MG tablet Take 1 tablet by mouth daily with lunch.     donepezil (ARICEPT) 10 MG tablet Take 1 tablet by mouth at bedtime.     fenofibrate 160 MG tablet TAKE ONE TABLET AT BEDTIME 90 tablet 3   ferrous sulfate 325 (65 FE) MG tablet Take by mouth.     FLUoxetine (PROZAC) 20 MG  capsule TAKE 1 CAPSULE BY MOUTH ONCE DAILY 90 capsule 1   gabapentin (NEURONTIN) 300 MG capsule TAKE 1 CAPSULE BY MOUTH 3 TIMES DAILY. 270 capsule 0   HYDROcodone-acetaminophen (NORCO) 10-325 MG tablet Take 1 tablet by mouth 4 (four) times daily as needed for moderate pain.     hydrocortisone cream 1 % Apply 1 application topically daily as needed for itching.     lisinopril (ZESTRIL) 20 MG tablet Take 30 mg by mouth daily.     loperamide (IMODIUM) 2 MG capsule Take 4 mg by mouth in the morning and at bedtime.     Multiple Vitamins-Minerals (PRESERVISION AREDS 2) CAPS Take 1 capsule by mouth daily.     omeprazole (PRILOSEC) 40 MG capsule TAKE 1 CAPSULE EVERY DAY 90 capsule 3   vitamin B-12 (CYANOCOBALAMIN) 1000 MCG tablet Take 1,000 mcg by mouth daily with lunch.      Vitamin D, Ergocalciferol, (DRISDOL) 1.25 MG (50000 UNIT) CAPS capsule Take 50,000 Units by mouth once a week.     vitamin E 180 MG (400 UNITS) capsule Take 800 Units by mouth daily with lunch.     Current Facility-Administered Medications on File Prior to Visit  Medication Dose Route Frequency Provider Last Rate Last Admin   0.9 %  sodium chloride infusion   Intravenous Continuous [name], Lenna Sciara  C, MD 10 mL/hr at 12/11/19 1123 New Bag at 12/11/19 1123     ROS see history of present illness  Objective  Physical Exam Vitals:   12/30/21 1523  BP: 130/70  Pulse: 79  Temp: 98 F (36.7 C)  SpO2: 97%    BP Readings from Last 3 Encounters:  12/30/21 130/70  12/21/21 118/80  12/15/21 117/73   Wt Readings from Last 3 Encounters:  12/30/21 154 lb (69.9 kg)  12/21/21 151 lb 6.4 oz (68.7 kg)  12/15/21 163 lb 2.3 oz (74 kg)    Physical Exam Constitutional:      General: He is not in acute distress.    Appearance: He is not diaphoretic.  HENT:     Head:   Cardiovascular:     Rate and Rhythm: Normal rate and regular rhythm.     Heart sounds: Normal heart sounds.  Pulmonary:     Effort: Pulmonary effort is  normal.     Breath sounds: Normal breath sounds.  Skin:    General: Skin is warm and dry.  Neurological:     Mental Status: He is alert.      Assessment/Plan: Please see individual problem list.  Problem List Items Addressed This Visit     Osteoporosis (Chronic)    We will check a vitamin D level and kidney function.  If they are acceptable we can consider starting on Fosamax.      Relevant Orders   Vitamin D (25 hydroxy) (Completed)   Basic Metabolic Panel (BMET) (Completed)   Rhinorrhea (Chronic)    Likely allergy related though could be nonallergic rhinorrhea.  We will trial Atrovent nasal spray to see if that is beneficial.      Hematoma    Patient with subcutaneous hematoma due to prior injury.  Discussed that this will take weeks if not months to fully resolve.  Discussed there is no signs of infection and thus likely no indication for surgical intervention.  He is on chronic narcotics already for pain.  He cannot take NSAIDs given that he is on Eliquis.  He will monitor.  He will also have ENT look at the area when he follows up with them next week.  He was advised to contact us immediately for signs of infection or worsening headaches or enlargement of the hematoma.      Other Visit Diagnoses     Allergic rhinitis, unspecified seasonality, unspecified trigger    -  Primary   Relevant Medications   ipratropium (ATROVENT) 0.06 % nasal spray      Scalp hematoma right parietal scalp  Covering Dr. Caryl Bis pt messaged or sister [facility] cant see him until 02/24/22 and wants sooner appt  Will send referral [facility] and [facility] in [place]  This has been ongoing issues since 12/2021 appears pt living in [place] with sister for now  Dr. Olivia Mackie McLean-Scocuzza   Return in about 3 months (around 04/01/2022).   Bryan Rumps, MD [place]

## 2021-12-31 NOTE — Assessment & Plan Note (Signed)
Patient with subcutaneous hematoma due to prior injury.  Discussed that this will take weeks if not months to fully resolve.  Discussed there is no signs of infection and thus likely no indication for surgical intervention.  He is on chronic narcotics already for pain.  He cannot take NSAIDs given that he is on Eliquis.  He will monitor.  He will also have ENT look at the area when he follows up with them next week.  He was advised to contact us immediately for signs of infection or worsening headaches or enlargement of the hematoma.

## 2021-12-31 NOTE — Assessment & Plan Note (Signed)
We will check a vitamin D level and kidney function.  If they are acceptable we can consider starting on Fosamax.

## 2021-12-31 NOTE — Assessment & Plan Note (Signed)
Likely allergy related though could be nonallergic rhinorrhea.  We will trial Atrovent nasal spray to see if that is beneficial.

## 2022-01-01 ENCOUNTER — Telehealth: Payer: Self-pay | Admitting: Family Medicine

## 2022-01-01 DIAGNOSIS — E559 Vitamin D deficiency, unspecified: Secondary | ICD-10-CM

## 2022-01-01 MED ORDER — VITAMIN D (ERGOCALCIFEROL) 1.25 MG (50000 UNIT) PO CAPS: ORAL_CAPSULE | ORAL | 1 refills | Status: DC

## 2022-01-01 NOTE — Telephone Encounter (Signed)
I believe somebody talk to him about this already though he needs to take the ergocalciferol 50,000 international units twice a week on Monday and Thursday and have his vitamin D rechecked in 8 weeks.  Orders placed.  We cannot start treatment for this until his vitamin D has been corrected.

## 2022-01-01 NOTE — Addendum Note (Signed)
Addended by: Caryl Bis, Michiah Mudry G on: 01/01/2022 04:01 PM   Modules accepted: Orders

## 2022-01-01 NOTE — Telephone Encounter (Signed)
Pt called wanting to know if need Fosamax because the provider was going to check his vitamin D and see. Pt does not know if he need to increase his vitamin D or not

## 2022-01-03 ENCOUNTER — Emergency Department
Admission: EM | Admit: 2022-01-03 | Discharge: 2022-01-03 | Disposition: A | Discharge status: Home / Self Care | Payer: Medicare (Managed Care) | Attending: Emergency Medicine | Admitting: Emergency Medicine

## 2022-01-03 DIAGNOSIS — S0003XD Contusion of scalp, subsequent encounter: Secondary | ICD-10-CM

## 2022-01-03 DIAGNOSIS — Z7901 Long term (current) use of anticoagulants: Secondary | ICD-10-CM | POA: Insufficient documentation

## 2022-01-03 DIAGNOSIS — R58 Hemorrhage, not elsewhere classified: Secondary | ICD-10-CM | POA: Diagnosis not present

## 2022-01-03 DIAGNOSIS — X58XXXA Exposure to other specified factors, initial encounter: Secondary | ICD-10-CM | POA: Diagnosis not present

## 2022-01-03 DIAGNOSIS — S0003XA Contusion of scalp, initial encounter: Secondary | ICD-10-CM | POA: Insufficient documentation

## 2022-01-03 DIAGNOSIS — W19XXXA Unspecified fall, initial encounter: Secondary | ICD-10-CM | POA: Diagnosis not present

## 2022-01-03 DIAGNOSIS — S0990XA Unspecified injury of head, initial encounter: Secondary | ICD-10-CM | POA: Diagnosis present

## 2022-01-03 MED ORDER — ACETAMINOPHEN 500 MG PO TABS
1000.0000 mg | ORAL_TABLET | Freq: Once | ORAL | Status: AC
  Administered 2022-01-03: 1000 mg via ORAL
  Filled 2022-01-03: qty 2

## 2022-01-03 NOTE — ED Provider Notes (Signed)
[facility] Provider Note   Event Date/Time   First MD Initiated Contact with Patient 01/03/22 1508     (approximate) History  Head Laceration  HPI Bryan [name] is a 81 y.o. 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  Patient states that 1 week ago he had a fall resulting in a right ear laceration as well as a right temporoparietal hematoma. EMS states that they had trouble controlling this bleeding and ended up putting hemostatic gauze over top in order to achieve hemostasis.  Patient is hemostatic on arrival only complaining of an 8/10 right-sided headache ROS: Patient currently denies any vision changes, tinnitus, difficulty speaking, facial droop, sore throat, chest pain, shortness of breath, abdominal pain, nausea/vomiting/diarrhea, dysuria, or weakness/numbness/paresthesias in any extremity   Physical Exam  Triage Vital Signs: ED Triage Vitals [01/03/22 1521]  Enc Vitals Group     BP 108/73     Pulse Rate 74     Resp 14     Temp      Temp src      SpO2 98 %     Weight      Height      Head Circumference      Peak Flow      Pain Score 8     Pain Loc      Pain Edu?      Excl. in [place]?    Most recent vital signs: Vitals:   01/03/22 1521  BP: 108/73  Pulse: 74  Resp: 14  SpO2: 98%   General: Awake, oriented x4. CV:  Good peripheral perfusion.  Resp:  Normal effort.  Abd:  No distention.  Other:  Elderly Caucasian male laying in bed in no acute distress with a 3 cm x 4 cm hematoma to the left temporoparietal scalp that is hemostatic at this time with eschar in place and hemostatic dressing.  NIHSS 0 ED Results / Procedures / Treatments  PROCEDURES: Critical Care performed: No .1-3 Lead EKG Interpretation  Performed by: Naaman Plummer, MD Authorized by: Naaman Plummer, MD     Interpretation:  normal     ECG rate:  75   ECG rate assessment: normal     Rhythm: sinus rhythm     Ectopy: none     Conduction: normal    MEDICATIONS ORDERED IN ED: Medications  acetaminophen (TYLENOL) tablet 1,000 mg (has no administration in time range)   IMPRESSION / MDM / ASSESSMENT AND PLAN / ED COURSE  I reviewed the triage vital signs and the nursing notes.                             Differential diagnosis includes, but is not limited to, arterial bleeding, symptomatic anemia, intracranial hemorrhage The patient is on the cardiac monitor to evaluate for evidence of arrhythmia and/or significant heart rate changes. Patient's presentation is most consistent with acute presentation with potential threat to life or bodily function. Patient is a 81 year old male with the above-stated past medical history who presents for bleeding from a scalp hematoma that he got from after a fall approximately 1 week ago.  Patient does still have hematoma to the right temporoparietal scalp that is hemostatic at this time.  Patient given instructions on wound care at home as well  as hemostatic gauze in case any further bleeding occurs.  At this time I have low suspicion for arterial bleeding as patient does not have any pulsatile bleeding at this time, symptomatic anemia as patient is not orthostatic, or intracranial hemorrhage as patient's neurologic exam is nonfocal.  Patient instructed to hold his nighttime dose of anticoagulation and resume this medication in the morning.  Patient also given strict return precautions as well as patient stated that he will have follow-up with his ENT physician in 3 days for recheck of his ear laceration. The patient has been reexamined and is ready to be discharged.  All diagnostic results have been reviewed and discussed with the patient/family.  Care plan has been outlined and the patient/family understands all current diagnoses, results, and treatment plans.  There are no new  complaints, changes, or physical findings at this time.  All questions have been addressed and answered.  Patient was instructed to, and agrees to follow-up with their primary care physician as well as return to the emergency department if any new or worsening symptoms develop.  Dispo: Discharge home with ENT and PCP follow-up   FINAL CLINICAL IMPRESSION(S) / ED DIAGNOSES   Final diagnoses:  None   Rx / DC Orders   ED Discharge Orders     None      Note:  This document was prepared using Dragon voice recognition software and may include unintentional dictation errors.   Naaman Plummer, MD 01/03/22 1538

## 2022-01-03 NOTE — ED Triage Notes (Signed)
Pt presents via EMS c/o bleeding from hematoma on scalp. Reports calming hair this am with peeling scab and active bleeding to head. C/o headache at this time. On Eliquis at home.

## 2022-01-06 ENCOUNTER — Other Ambulatory Visit: Payer: Medicare (Managed Care)

## 2022-01-09 ENCOUNTER — Other Ambulatory Visit: Payer: Self-pay

## 2022-01-09 ENCOUNTER — Encounter: Payer: Self-pay | Admitting: Emergency Medicine

## 2022-01-09 ENCOUNTER — Emergency Department
Admission: EM | Admit: 2022-01-09 | Discharge: 2022-01-09 | Disposition: A | Discharge status: Home / Self Care | Payer: Medicare (Managed Care) | Attending: Emergency Medicine | Admitting: Emergency Medicine

## 2022-01-09 DIAGNOSIS — Z7901 Long term (current) use of anticoagulants: Secondary | ICD-10-CM | POA: Insufficient documentation

## 2022-01-09 DIAGNOSIS — S0003XD Contusion of scalp, subsequent encounter: Secondary | ICD-10-CM | POA: Insufficient documentation

## 2022-01-09 DIAGNOSIS — I130 Hypertensive heart and chronic kidney disease with heart failure and stage 1 through stage 4 chronic kidney disease, or unspecified chronic kidney disease: Secondary | ICD-10-CM | POA: Diagnosis not present

## 2022-01-09 DIAGNOSIS — I2510 Atherosclerotic heart disease of native coronary artery without angina pectoris: Secondary | ICD-10-CM | POA: Diagnosis not present

## 2022-01-09 DIAGNOSIS — S0990XD Unspecified injury of head, subsequent encounter: Secondary | ICD-10-CM | POA: Diagnosis present

## 2022-01-09 DIAGNOSIS — D649 Anemia, unspecified: Secondary | ICD-10-CM | POA: Diagnosis not present

## 2022-01-09 DIAGNOSIS — I509 Heart failure, unspecified: Secondary | ICD-10-CM | POA: Insufficient documentation

## 2022-01-09 DIAGNOSIS — N189 Chronic kidney disease, unspecified: Secondary | ICD-10-CM | POA: Diagnosis not present

## 2022-01-09 DIAGNOSIS — W1839XD Other fall on same level, subsequent encounter: Secondary | ICD-10-CM | POA: Diagnosis not present

## 2022-01-09 DIAGNOSIS — S0003XA Contusion of scalp, initial encounter: Secondary | ICD-10-CM | POA: Diagnosis not present

## 2022-01-09 LAB — CBC WITH DIFFERENTIAL/PLATELET
Abs Immature Granulocytes: 0.03 10*3/uL (ref 0.00–0.07)
Basophils Absolute: 0 10*3/uL (ref 0.0–0.1)
Basophils Relative: 1 %
Eosinophils Absolute: 0 10*3/uL (ref 0.0–0.5)
Eosinophils Relative: 1 %
HCT: 23.3 % — ABNORMAL LOW (ref 39.0–52.0)
Hemoglobin: 7.2 g/dL — ABNORMAL LOW (ref 13.0–17.0)
Immature Granulocytes: 1 %
Lymphocytes Relative: 4 %
Lymphs Abs: 0.2 10*3/uL — ABNORMAL LOW (ref 0.7–4.0)
MCH: 30.6 pg (ref 26.0–34.0)
MCHC: 30.9 g/dL (ref 30.0–36.0)
MCV: 99.1 fL (ref 80.0–100.0)
Monocytes Absolute: 0.3 10*3/uL (ref 0.1–1.0)
Monocytes Relative: 7 %
Neutro Abs: 3.8 10*3/uL (ref 1.7–7.7)
Neutrophils Relative %: 86 %
Platelets: 114 10*3/uL — ABNORMAL LOW (ref 150–400)
RBC: 2.35 MIL/uL — ABNORMAL LOW (ref 4.22–5.81)
RDW: 18.2 % — ABNORMAL HIGH (ref 11.5–15.5)
WBC: 4.3 10*3/uL (ref 4.0–10.5)
nRBC: 0 % (ref 0.0–0.2)

## 2022-01-09 LAB — BASIC METABOLIC PANEL
Anion gap: 7 (ref 5–15)
BUN: 23 mg/dL (ref 8–23)
CO2: 25 mmol/L (ref 22–32)
Calcium: 8.8 mg/dL — ABNORMAL LOW (ref 8.9–10.3)
Chloride: 106 mmol/L (ref 98–111)
Creatinine, Ser: 0.93 mg/dL (ref 0.61–1.24)
GFR, Estimated: >60 mL/min (ref >60)
Glucose, Bld: 138 mg/dL — ABNORMAL HIGH (ref 70–99)
Potassium: 4.2 mmol/L (ref 3.5–5.1)
Sodium: 138 mmol/L (ref 135–145)

## 2022-01-09 NOTE — ED Provider Notes (Signed)
[facility] Provider Note    Event Date/Time   First MD Initiated Contact with Patient 01/09/22 1040     (approximate)   History   Chief Complaint Hematoma   HPI  Bryan [name] is a 81 y.o. male with past medical history of hypertension, hyperlipidemia, CAD, CHF, chronic atrial fibrillation on Eliquis, rheumatoid arthritis, and CKD who presents to the ED complaining of hematoma.  Patient reports that at the end of July he had a fall with significant hematoma to his right parietal scalp as well as laceration to his ear.  He was admitted for observation and repair of ear laceration, CT imaging of his head was negative at that time.  Since then, he has dealt with occasional bleeding from the hematoma site.  He went to put on a T-shirt earlier this morning and states he accidentally removed the scab on top of the hematoma.  He experienced significant bleeding afterwards and EMS was called to the scene.  They were unable to control bleeding with pressure, dressing was put in place and patient transported to the ED.  He does state that he has been feeling slightly weaker than usual, denies any fevers, cough, chest pain, or shortness of breath.  He continues to take Eliquis as prescribed.     Physical Exam   Triage Vital Signs: ED Triage Vitals  Enc Vitals Group     BP 01/09/22 1042 (!) 125/59     Pulse Rate 01/09/22 1042 73     Resp 01/09/22 1042 16     Temp 01/09/22 1042 98.6 F (37 C)     Temp Source 01/09/22 1042 Oral     SpO2 01/09/22 1042 100 %     Weight 01/09/22 1036 154 lb 1.6 oz (69.9 kg)     Height 01/09/22 1036 6' (1.829 m)     Head Circumference --      Peak Flow --      Pain Score 01/09/22 1036 4     Pain Loc --      Pain Edu? --      Excl. in [place]? --     Most recent vital signs: Vitals:   01/09/22 1100 01/09/22 1130  BP: (!) 117/59 120/63  Pulse: 62 77  Resp: 15 19  Temp:    SpO2: 98% 97%    Constitutional: Alert and  oriented. Eyes: Conjunctivae are normal. Head: Hematoma to right parietal scalp with central scab and small amount of oozing blood.  Healing laceration noted to right ear. Nose: No congestion/rhinnorhea. Mouth/Throat: Mucous membranes are moist.  Cardiovascular: Normal rate, regular rhythm. Grossly normal heart sounds.  2+ radial pulses bilaterally. Respiratory: Normal respiratory effort.  No retractions. Lungs CTAB. Gastrointestinal: Soft and nontender. No distention. Musculoskeletal: No lower extremity tenderness nor edema.  Neurologic:  Normal speech and language. No gross focal neurologic deficits are appreciated.    ED Results / Procedures / Treatments   Labs (all labs ordered are listed, but only abnormal results are displayed) Labs Reviewed  CBC WITH DIFFERENTIAL/PLATELET - Abnormal; Notable for the following components:      Result Value   RBC 2.35 (*)    Hemoglobin 7.2 (*)    HCT 23.3 (*)    RDW 18.2 (*)    Platelets 114 (*)    Lymphs Abs 0.2 (*)    All other components within normal limits  BASIC METABOLIC PANEL - Abnormal; Notable for the following components:   Glucose, Bld 138 (*)  Calcium 8.8 (*)    All other components within normal limits     EKG  ED ECG REPORT I, Blake Divine, the attending physician, personally viewed and interpreted this ECG.   Date: 01/09/2022  EKG Time: 10:57  Rate: 66  Rhythm: atrial fibrillation  Axis: Normal  Intervals:none  ST&T Change: None  PROCEDURES:  Critical Care performed: No  Procedures   MEDICATIONS ORDERED IN ED: Medications - No data to display   IMPRESSION / MDM / [place] / ED COURSE  I reviewed the triage vital signs and the nursing notes.                              81 y.o. male with past medical history of hypertension, hyperlipidemia, CAD, CHF, chronic atrial fibrillation on Eliquis, rheumatoid arthritis, and CKD who presents to the ED with bleeding from hematoma present since  fall at the end of last month along with some generalized weakness.  Patient's presentation is most consistent with acute presentation with potential threat to life or bodily function.  Differential diagnosis includes, but is not limited to, anemia, electrolyte abnormality, AKI, wound infection, hematoma.  Patient nontoxic-appearing and in no acute distress, vital signs are unremarkable.  Prior CT imaging of his head was unremarkable and with no new trauma, do not feel repeat imaging is indicated, especially given nonfocal neurologic exam.  Dressing applied by EMS was removed with minimal bleeding at this time, Surgicel was put in place with overlying dressing and we will observe here in the ED.  We will screen labs for anemia or electrolyte abnormality, EKG shows chronic atrial fibrillation with no ischemic changes.  Hemoglobin noted to be 7.2, which is likely the source of his weakness.  No significant electrolyte abnormality or AKI noted.  No active bleeding on reassessment, bleeding from hematoma has stopped following application of Surgicel dressing.  We will hold off on transfusion given hemoglobin greater than 7 with no ongoing bleeding, patient appropriate for discharge home with close follow-up with his PCP or hematology for repeat labs.  He was counseled to hold his Eliquis for 2 days and to return to the ED for new or worsening symptoms, patient agrees with plan.      FINAL CLINICAL IMPRESSION(S) / ED DIAGNOSES   Final diagnoses:  Hematoma of scalp, subsequent encounter  Anemia, unspecified type     Rx / DC Orders   ED Discharge Orders     None        Note:  This document was prepared using Dragon voice recognition software and may include unintentional dictation errors.   Blake Divine, MD 01/09/22 1243

## 2022-01-09 NOTE — ED Notes (Signed)
Dressed wound. Bleed controlled prior to departure

## 2022-01-09 NOTE — ED Triage Notes (Addendum)
Pt reports had a hematoma on his head, put on a shirt and scraped the scab off which caused it to bleed. Pt reports had to called the medics who told him they could apply something to stop the bleeding but then he would have to come to the ED so he just came. Pt reports has been seen here for the same. Pt takes eloquis

## 2022-01-10 ENCOUNTER — Observation Stay
Admission: EM | Admit: 2022-01-10 | Discharge: 2022-01-11 | Disposition: A | Discharge status: Home / Self Care | Payer: Medicare (Managed Care) | Attending: Internal Medicine | Admitting: Internal Medicine

## 2022-01-10 ENCOUNTER — Encounter: Payer: Self-pay | Admitting: Emergency Medicine

## 2022-01-10 ENCOUNTER — Other Ambulatory Visit: Payer: Self-pay

## 2022-01-10 DIAGNOSIS — S0003XA Contusion of scalp, initial encounter: Principal | ICD-10-CM | POA: Diagnosis present

## 2022-01-10 DIAGNOSIS — D509 Iron deficiency anemia, unspecified: Secondary | ICD-10-CM | POA: Diagnosis present

## 2022-01-10 DIAGNOSIS — D696 Thrombocytopenia, unspecified: Secondary | ICD-10-CM | POA: Diagnosis present

## 2022-01-10 DIAGNOSIS — F0390 Unspecified dementia without behavioral disturbance: Secondary | ICD-10-CM | POA: Diagnosis present

## 2022-01-10 DIAGNOSIS — W19XXXD Unspecified fall, subsequent encounter: Secondary | ICD-10-CM | POA: Diagnosis not present

## 2022-01-10 DIAGNOSIS — F418 Other specified anxiety disorders: Secondary | ICD-10-CM | POA: Diagnosis not present

## 2022-01-10 DIAGNOSIS — Z85828 Personal history of other malignant neoplasm of skin: Secondary | ICD-10-CM | POA: Insufficient documentation

## 2022-01-10 DIAGNOSIS — I2510 Atherosclerotic heart disease of native coronary artery without angina pectoris: Secondary | ICD-10-CM | POA: Diagnosis present

## 2022-01-10 DIAGNOSIS — Z7901 Long term (current) use of anticoagulants: Secondary | ICD-10-CM | POA: Insufficient documentation

## 2022-01-10 DIAGNOSIS — Z79899 Other long term (current) drug therapy: Secondary | ICD-10-CM | POA: Diagnosis not present

## 2022-01-10 DIAGNOSIS — N179 Acute kidney failure, unspecified: Secondary | ICD-10-CM | POA: Diagnosis present

## 2022-01-10 DIAGNOSIS — D62 Acute posthemorrhagic anemia: Secondary | ICD-10-CM | POA: Diagnosis not present

## 2022-01-10 DIAGNOSIS — I130 Hypertensive heart and chronic kidney disease with heart failure and stage 1 through stage 4 chronic kidney disease, or unspecified chronic kidney disease: Secondary | ICD-10-CM | POA: Insufficient documentation

## 2022-01-10 DIAGNOSIS — I5032 Chronic diastolic (congestive) heart failure: Secondary | ICD-10-CM | POA: Diagnosis not present

## 2022-01-10 DIAGNOSIS — D500 Iron deficiency anemia secondary to blood loss (chronic): Secondary | ICD-10-CM

## 2022-01-10 DIAGNOSIS — D649 Anemia, unspecified: Secondary | ICD-10-CM | POA: Diagnosis not present

## 2022-01-10 DIAGNOSIS — I10 Essential (primary) hypertension: Secondary | ICD-10-CM | POA: Diagnosis not present

## 2022-01-10 DIAGNOSIS — I482 Chronic atrial fibrillation, unspecified: Secondary | ICD-10-CM | POA: Diagnosis not present

## 2022-01-10 DIAGNOSIS — N1831 Chronic kidney disease, stage 3a: Secondary | ICD-10-CM | POA: Insufficient documentation

## 2022-01-10 DIAGNOSIS — Z87891 Personal history of nicotine dependence: Secondary | ICD-10-CM | POA: Diagnosis not present

## 2022-01-10 DIAGNOSIS — Z952 Presence of prosthetic heart valve: Secondary | ICD-10-CM | POA: Insufficient documentation

## 2022-01-10 DIAGNOSIS — E785 Hyperlipidemia, unspecified: Secondary | ICD-10-CM | POA: Diagnosis not present

## 2022-01-10 DIAGNOSIS — Z96653 Presence of artificial knee joint, bilateral: Secondary | ICD-10-CM | POA: Diagnosis not present

## 2022-01-10 DIAGNOSIS — N182 Chronic kidney disease, stage 2 (mild): Secondary | ICD-10-CM | POA: Diagnosis present

## 2022-01-10 LAB — CBC WITH DIFFERENTIAL/PLATELET
Abs Immature Granulocytes: 0.03 10*3/uL (ref 0.00–0.07)
Basophils Absolute: 0 10*3/uL (ref 0.0–0.1)
Basophils Relative: 0 %
Eosinophils Absolute: 0 10*3/uL (ref 0.0–0.5)
Eosinophils Relative: 0 %
HCT: 21.1 % — ABNORMAL LOW (ref 39.0–52.0)
Hemoglobin: 6.3 g/dL — ABNORMAL LOW (ref 13.0–17.0)
Immature Granulocytes: 1 %
Lymphocytes Relative: 5 %
Lymphs Abs: 0.3 10*3/uL — ABNORMAL LOW (ref 0.7–4.0)
MCH: 30.1 pg (ref 26.0–34.0)
MCHC: 29.9 g/dL — ABNORMAL LOW (ref 30.0–36.0)
MCV: 101 fL — ABNORMAL HIGH (ref 80.0–100.0)
Monocytes Absolute: 0.3 10*3/uL (ref 0.1–1.0)
Monocytes Relative: 5 %
Neutro Abs: 4.7 10*3/uL (ref 1.7–7.7)
Neutrophils Relative %: 89 %
Platelets: 105 10*3/uL — ABNORMAL LOW (ref 150–400)
RBC: 2.09 MIL/uL — ABNORMAL LOW (ref 4.22–5.81)
RDW: 18.2 % — ABNORMAL HIGH (ref 11.5–15.5)
WBC: 5.3 10*3/uL (ref 4.0–10.5)
nRBC: 0 % (ref 0.0–0.2)

## 2022-01-10 LAB — BASIC METABOLIC PANEL
Anion gap: 8 (ref 5–15)
BUN: 29 mg/dL — ABNORMAL HIGH (ref 8–23)
CO2: 24 mmol/L (ref 22–32)
Calcium: 8.4 mg/dL — ABNORMAL LOW (ref 8.9–10.3)
Chloride: 105 mmol/L (ref 98–111)
Creatinine, Ser: 1.18 mg/dL (ref 0.61–1.24)
GFR, Estimated: >60 mL/min (ref >60)
Glucose, Bld: 162 mg/dL — ABNORMAL HIGH (ref 70–99)
Potassium: 4.8 mmol/L (ref 3.5–5.1)
Sodium: 137 mmol/L (ref 135–145)

## 2022-01-10 LAB — PROTIME-INR
INR: 1.3 — ABNORMAL HIGH (ref 0.8–1.2)
Prothrombin Time: 16.4 seconds — ABNORMAL HIGH (ref 11.4–15.2)

## 2022-01-10 LAB — PREPARE RBC (CROSSMATCH)

## 2022-01-10 LAB — APTT: aPTT: 30 seconds (ref 24–36)

## 2022-01-10 LAB — BRAIN NATRIURETIC PEPTIDE: B Natriuretic Peptide: 185.3 pg/mL — ABNORMAL HIGH (ref 0.0–100.0)

## 2022-01-10 MED ORDER — GABAPENTIN 300 MG PO CAPS
300.0000 mg | ORAL_CAPSULE | Freq: Three times a day (TID) | ORAL | Status: DC
  Administered 2022-01-10 – 2022-01-11 (×3): 300 mg via ORAL
  Filled 2022-01-10 (×3): qty 1

## 2022-01-10 MED ORDER — LIDOCAINE-EPINEPHRINE 2 %-1:100000 IJ SOLN
30.0000 mL | Freq: Once | INTRAMUSCULAR | Status: AC
  Administered 2022-01-10: 30 mL via INTRADERMAL
  Filled 2022-01-10: qty 2

## 2022-01-10 MED ORDER — IPRATROPIUM BROMIDE 0.06 % NA SOLN
2.0000 | Freq: Four times a day (QID) | NASAL | Status: DC
  Filled 2022-01-10: qty 15

## 2022-01-10 MED ORDER — VITAMIN B-12 1000 MCG PO TABS
1000.0000 ug | ORAL_TABLET | Freq: Every day | ORAL | Status: DC
  Administered 2022-01-11: 1000 ug via ORAL
  Filled 2022-01-10: qty 1

## 2022-01-10 MED ORDER — SODIUM CHLORIDE 0.9 % IV SOLN: 10.0000 mL/h | Freq: Once | INTRAVENOUS | Status: DC

## 2022-01-10 MED ORDER — HYDROCORTISONE 1 % EX CREA: 1.0000 | TOPICAL_CREAM | Freq: Every day | CUTANEOUS | Status: DC | PRN

## 2022-01-10 MED ORDER — ACETAMINOPHEN 325 MG PO TABS
650.0000 mg | ORAL_TABLET | Freq: Four times a day (QID) | ORAL | Status: DC | PRN
  Administered 2022-01-10: 650 mg via ORAL
  Filled 2022-01-10: qty 2

## 2022-01-10 MED ORDER — FERROUS SULFATE 325 (65 FE) MG PO TABS
325.0000 mg | ORAL_TABLET | Freq: Every day | ORAL | Status: DC
  Administered 2022-01-11: 325 mg via ORAL
  Filled 2022-01-10: qty 1

## 2022-01-10 MED ORDER — FENOFIBRATE 160 MG PO TABS
160.0000 mg | ORAL_TABLET | Freq: Every day | ORAL | Status: DC
  Administered 2022-01-10: 160 mg via ORAL
  Filled 2022-01-10 (×2): qty 1

## 2022-01-10 MED ORDER — ONDANSETRON HCL 4 MG/2ML IJ SOLN: 4.0000 mg | Freq: Three times a day (TID) | INTRAMUSCULAR | Status: DC | PRN

## 2022-01-10 MED ORDER — CALCIUM CARBONATE 1250 (500 CA) MG PO TABS
1.0000 | ORAL_TABLET | Freq: Every day | ORAL | Status: DC
Start: 2022-01-11 — End: 2022-01-11
  Administered 2022-01-11: 1250 mg via ORAL
  Filled 2022-01-10: qty 1

## 2022-01-10 MED ORDER — AMLODIPINE BESYLATE 5 MG PO TABS
5.0000 mg | ORAL_TABLET | Freq: Every day | ORAL | Status: DC
  Administered 2022-01-11: 5 mg via ORAL
  Filled 2022-01-10: qty 1

## 2022-01-10 MED ORDER — HYDROCODONE-ACETAMINOPHEN 10-325 MG PO TABS
1.0000 | ORAL_TABLET | Freq: Four times a day (QID) | ORAL | Status: DC | PRN
  Administered 2022-01-10 – 2022-01-11 (×2): 1 via ORAL
  Filled 2022-01-10 (×2): qty 1

## 2022-01-10 MED ORDER — PANTOPRAZOLE SODIUM 40 MG PO TBEC: 40.0000 mg | DELAYED_RELEASE_TABLET | Freq: Every day | ORAL | Status: DC

## 2022-01-10 MED ORDER — VITAMIN E 180 MG (400 UNIT) PO CAPS
800.0000 [IU] | ORAL_CAPSULE | Freq: Every day | ORAL | Status: DC
  Filled 2022-01-10: qty 8

## 2022-01-10 MED ORDER — ATORVASTATIN CALCIUM 20 MG PO TABS
40.0000 mg | ORAL_TABLET | Freq: Every day | ORAL | Status: DC
  Administered 2022-01-10: 40 mg via ORAL
  Filled 2022-01-10: qty 2

## 2022-01-10 MED ORDER — CLINDAMYCIN HCL 150 MG PO CAPS
150.0000 mg | ORAL_CAPSULE | Freq: Three times a day (TID) | ORAL | Status: DC
Start: 2022-01-10 — End: 2022-01-11
  Administered 2022-01-10 – 2022-01-11 (×3): 150 mg via ORAL
  Filled 2022-01-10 (×4): qty 1

## 2022-01-10 MED ORDER — CEFAZOLIN SODIUM-DEXTROSE 1-4 GM/50ML-% IV SOLN
1.0000 g | Freq: Three times a day (TID) | INTRAVENOUS | Status: DC
  Filled 2022-01-10: qty 50

## 2022-01-10 MED ORDER — HYDRALAZINE HCL 20 MG/ML IJ SOLN: 5.0000 mg | INTRAMUSCULAR | Status: DC | PRN

## 2022-01-10 MED ORDER — OCUVITE-LUTEIN PO CAPS
1.0000 | ORAL_CAPSULE | Freq: Every day | ORAL | Status: DC
  Administered 2022-01-11: 1 via ORAL
  Filled 2022-01-10: qty 1

## 2022-01-10 MED ORDER — LORATADINE 10 MG PO TABS
10.0000 mg | ORAL_TABLET | Freq: Every day | ORAL | Status: DC
  Administered 2022-01-10 – 2022-01-11 (×2): 10 mg via ORAL
  Filled 2022-01-10 (×2): qty 1

## 2022-01-10 MED ORDER — DONEPEZIL HCL 5 MG PO TABS
10.0000 mg | ORAL_TABLET | Freq: Every day | ORAL | Status: DC
  Administered 2022-01-10: 10 mg via ORAL
  Filled 2022-01-10: qty 2

## 2022-01-10 MED ORDER — FLUOXETINE HCL 20 MG PO CAPS
20.0000 mg | ORAL_CAPSULE | Freq: Every day | ORAL | Status: DC
  Administered 2022-01-11: 20 mg via ORAL
  Filled 2022-01-10: qty 1

## 2022-01-10 MED ORDER — LOPERAMIDE HCL 2 MG PO CAPS
4.0000 mg | ORAL_CAPSULE | Freq: Two times a day (BID) | ORAL | Status: DC
  Administered 2022-01-10 – 2022-01-11 (×2): 4 mg via ORAL
  Filled 2022-01-10 (×2): qty 2

## 2022-01-10 NOTE — Assessment & Plan Note (Signed)
-  See above - Continue iron supplement

## 2022-01-10 NOTE — Assessment & Plan Note (Addendum)
Stable, GFR> 60 today -Follow-up with BMP

## 2022-01-10 NOTE — ED Provider Notes (Signed)
[facility] Provider Note    Event Date/Time   First MD Initiated Contact with Patient 01/10/22 1340     (approximate)   History   Wound Check   HPI  Bryan [name] is a 81 y.o. male here with wound check.  The patient has been seen multiple times after a fall several weeks ago.  Had hematoma on his right scalp as well as a severe ear laceration which was repaired by ENT.  The hematoma was actually just seen 2 days ago and had gauze applied for hemostasis for a small abrasion that patient had reportedly ripped off when he took his shirt off.  Patient states that over the last 24 hours, the area has resumed bleeding and has bled through significant amount of bandages.  He has been essentially unable to get it to stop bleeding unless he is applying direct pressure.  He has felt mildly weak and lightheaded.  His hemoglobin was 7.2 on his last check.  He has stopped his anticoagulant since his last visit.  He is on this for atrial fibrillation.     Physical Exam   Triage Vital Signs: ED Triage Vitals  Enc Vitals Group     BP 01/10/22 1234 110/80     Pulse Rate 01/10/22 1234 73     Resp 01/10/22 1234 18     Temp 01/10/22 1234 97.7 F (36.5 C)     Temp Source 01/10/22 1234 Oral     SpO2 01/10/22 1234 100 %     Weight 01/10/22 1233 154 lb 1.6 oz (69.9 kg)     Height 01/10/22 1233 6' (1.829 m)     Head Circumference --      Peak Flow --      Pain Score 01/10/22 1233 5     Pain Loc --      Pain Edu? --      Excl. in [place]? --     Most recent vital signs: Vitals:   01/10/22 1731 01/10/22 2040  BP: (!) 148/62 (!) 122/47  Pulse: 75 73  Resp: 18 (!) 22  Temp: 98 F (36.7 C) 98.4 F (36.9 C)  SpO2: 99%      General: Awake, no distress.  CV:  Good peripheral perfusion.  Resp:  Normal effort.  Abd:  No distention.  Other:  Approximately 1-1/2 x 1 cm open, gaping wound to the right scalp.  There is what appears to be fairly fresh hematoma  underlying this, which upon evacuation, began bleeding from what I suspect is a proximal scalp arterial.   ED Results / Procedures / Treatments   Labs (all labs ordered are listed, but only abnormal results are displayed) Labs Reviewed  CBC WITH DIFFERENTIAL/PLATELET - Abnormal; Notable for the following components:      Result Value   RBC 2.09 (*)    Hemoglobin 6.3 (*)    HCT 21.1 (*)    MCV 101.0 (*)    MCHC 29.9 (*)    RDW 18.2 (*)    Platelets 105 (*)    Lymphs Abs 0.3 (*)    All other components within normal limits  BASIC METABOLIC PANEL - Abnormal; Notable for the following components:   Glucose, Bld 162 (*)    BUN 29 (*)    Calcium 8.4 (*)    All other components within normal limits  PROTIME-INR - Abnormal; Notable for the following components:   Prothrombin Time 16.4 (*)    INR  1.3 (*)    All other components within normal limits  BRAIN NATRIURETIC PEPTIDE - Abnormal; Notable for the following components:   B Natriuretic Peptide 185.3 (*)    All other components within normal limits  APTT  CBC  PREPARE RBC (CROSSMATCH)  TYPE AND SCREEN     EKG    RADIOLOGY    I also independently reviewed and agree with radiologist interpretations.   PROCEDURES:  Critical Care performed: Yes, see critical care procedure note(s)  .Critical Care  Performed by: Duffy Bruce, MD Authorized by: Duffy Bruce, MD   Critical care provider statement:    Critical care time (minutes):  30   Critical care time was exclusive of:  Separately billable procedures and treating other patients   Critical care was necessary to treat or prevent imminent or life-threatening deterioration of the following conditions:  Cardiac failure, circulatory failure and respiratory failure   Critical care was time spent personally by me on the following activities:  Development of treatment plan with patient or surrogate, discussions with consultants, evaluation of patient's response to  treatment, examination of patient, ordering and review of laboratory studies, ordering and review of radiographic studies, ordering and performing treatments and interventions, pulse oximetry, re-evaluation of patient's condition and review of old charts Wound repair  Date/Time: 01/10/2022 8:56 PM  Performed by: Duffy Bruce, MD Authorized by: Duffy Bruce, MD  Consent: Verbal consent obtained. Preparation: Patient was prepped and draped in the usual sterile fashion. Local anesthesia used: yes Anesthesia: local infiltration  Anesthesia: Local anesthesia used: yes Local Anesthetic: lidocaine 1% with epinephrine Anesthetic total: 10 mL  Sedation: Patient sedated: no  Patient tolerance: patient tolerated the procedure well with no immediate complications Comments: Right scalp wound noted with significant amount of adhered gauze. This was removed with saline revealing an open, gaping skin wound with underlying hematoma. The hematoma was evacuated and the area irrigated thoroughly with normal saline and betadine. Despite this, pt had persistent arteriolar bleeding noted. Decision made to pack the wound with saline-soaked gauze for hemostasis followed by consultation with General Surgery.       MEDICATIONS ORDERED IN ED: Medications  0.9 %  sodium chloride infusion (has no administration in time range)  clindamycin (CLEOCIN) capsule 150 mg (150 mg Oral Not Given 01/10/22 1700)  ondansetron (ZOFRAN) injection 4 mg (has no administration in time range)  acetaminophen (TYLENOL) tablet 650 mg (650 mg Oral Given 01/10/22 1858)  hydrALAZINE (APRESOLINE) injection 5 mg (has no administration in time range)  HYDROcodone-acetaminophen (NORCO) 10-325 MG per tablet 1 tablet (has no administration in time range)  atorvastatin (LIPITOR) tablet 40 mg (40 mg Oral Given 01/10/22 1854)  amLODipine (NORVASC) tablet 5 mg (has no administration in time range)  fenofibrate tablet 160 mg (has no  administration in time range)  donepezil (ARICEPT) tablet 10 mg (has no administration in time range)  FLUoxetine (PROZAC) capsule 20 mg (has no administration in time range)  loperamide (IMODIUM) capsule 4 mg (has no administration in time range)  pantoprazole (PROTONIX) EC tablet 40 mg (has no administration in time range)  ferrous sulfate tablet 325 mg (has no administration in time range)  cyanocobalamin (VITAMIN B12) tablet 1,000 mcg (has no administration in time range)  gabapentin (NEURONTIN) capsule 300 mg (300 mg Oral Given 01/10/22 1855)  calcium carbonate (OS-CAL - dosed in mg of elemental calcium) tablet 1,250 mg (has no administration in time range)  multivitamin-lutein (OCUVITE-LUTEIN) capsule 1 capsule (has no administration in time range)  vitamin E capsule 800 Units (has no administration in time range)  ipratropium (ATROVENT) 0.06 % nasal spray 2 spray (has no administration in time range)  loratadine (CLARITIN) tablet 10 mg (10 mg Oral Given 01/10/22 1854)  hydrocortisone cream 1 % 1 Application (has no administration in time range)  lidocaine-EPINEPHrine (XYLOCAINE W/EPI) 2 %-1:100000 (with pres) injection 30 mL (30 mLs Intradermal Given by Other 01/10/22 1450)     IMPRESSION / MDM / [place] / ED COURSE  I reviewed the triage vital signs and the nursing notes.                               The patient is on the cardiac monitor to evaluate for evidence of arrhythmia and/or significant heart rate changes.   Ddx:  Differential includes the following, with pertinent life- or limb-threatening emergencies considered:  Scalp hematoma with secondary skin breakdown, likely underlying degloving injury, symptomatic anemia  Patient's presentation is most consistent with acute presentation with potential threat to life or bodily function.  MDM:  81 year old male here with large hematoma to the right parietal scalp after fall several weeks ago.  The hematoma itself  is actually soft, and was expressed from an open wound which I suspect is due to local skin breakdown from the hematoma.  After direct pressure, injecting with lidocaine and epinephrine, I was unable to initially control bleeding.  Patient had a hemoglobin of 7.2 prior to bleeding that started overnight.  Suspect he will likely need transfusion.  Will ask Dr. Lysle Pearl of general surgery to come evaluate the wound.  Patient certainly at risk for infection.  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  Will give Ancef.  The wound was thoroughly irrigated by myself and is now hemostatic with pressure. Will plan to admit to medicine for transfusion, monitoring, with Dr. Lysle Pearl monitoring wound.    MEDICATIONS GIVEN IN ED: Medications  0.9 %  sodium chloride infusion (has no administration in time range)  clindamycin (CLEOCIN) capsule 150 mg (150 mg Oral Not Given 01/10/22 1700)  ondansetron (ZOFRAN) injection 4 mg (has no administration in time range)  acetaminophen (TYLENOL) tablet 650 mg (650 mg Oral Given 01/10/22 1858)  hydrALAZINE (APRESOLINE) injection 5 mg (has no administration in time range)  HYDROcodone-acetaminophen (NORCO) 10-325 MG per tablet 1 tablet (has no administration in time range)  atorvastatin (LIPITOR) tablet 40 mg (40 mg Oral Given 01/10/22 1854)  amLODipine (NORVASC) tablet 5 mg (has no administration in time range)  fenofibrate tablet 160 mg (has no administration in time range)  donepezil (ARICEPT) tablet 10 mg (has no administration in time range)  FLUoxetine (PROZAC) capsule 20 mg (has no administration in time range)  loperamide (IMODIUM) capsule 4 mg (has no administration in time range)  pantoprazole (PROTONIX) EC tablet 40 mg (has no administration in time range)  ferrous sulfate tablet 325 mg (has no administration in time range)  cyanocobalamin (VITAMIN B12) tablet 1,000 mcg (has no administration in time range)  gabapentin (NEURONTIN) capsule 300 mg (300  mg Oral Given 01/10/22 1855)  calcium carbonate (OS-CAL - dosed in mg of elemental calcium) tablet 1,250 mg (has no administration in time range)  multivitamin-lutein (OCUVITE-LUTEIN) capsule 1 capsule (has no administration in time range)  vitamin E capsule 800 Units (has no administration in time range)  ipratropium (ATROVENT) 0.06 % nasal spray 2 spray (has no administration in time range)  loratadine (  CLARITIN) tablet 10 mg (10 mg Oral Given 01/10/22 1854)  hydrocortisone cream 1 % 1 Application (has no administration in time range)  lidocaine-EPINEPHrine (XYLOCAINE W/EPI) 2 %-1:100000 (with pres) injection 30 mL (30 mLs Intradermal Given by Other 01/10/22 1450)     Consults:  Dr. Lysle Pearl, General Surgery   EMR reviewed  Reviewed prior ED visits and imaging     FINAL CLINICAL IMPRESSION(S) / ED DIAGNOSES   Final diagnoses:  Hematoma of scalp, initial encounter  Blood loss anemia     Rx / DC Orders   ED Discharge Orders     None        Note:  This document was prepared using Dragon voice recognition software and may include unintentional dictation errors.   Duffy Bruce, MD 01/10/22 2100

## 2022-01-10 NOTE — ED Triage Notes (Signed)
Pt reports was seen here yesterday for a bleeding area on his head. Pt was d/c and told to watch it. Pt reports he feels like it is bleeding again and needs it checked. Pt has been here several times for the same thing

## 2022-01-10 NOTE — Assessment & Plan Note (Signed)
-   Fenofibrate, Lipitor 

## 2022-01-10 NOTE — Assessment & Plan Note (Signed)
Initially held his lisinopril due to mild acute kidney injury and normal blood pressures.  With blood, blood pressure has been trending upward and patient will resume his blood pressure medications on discharge

## 2022-01-10 NOTE — Assessment & Plan Note (Signed)
This is chronic issue.  Platelets 105 -Follow-up with CBC

## 2022-01-10 NOTE — Assessment & Plan Note (Addendum)
Scalp hematoma with bleeding and acute blood loss anemia: Hemoglobin dropped from 9.0 to 6.3.  Bleeding was stopped by Dr. Lysle Pearl of surgery. Likely will need plastics referral for the large defect per Dr. Lysle Pearl.

## 2022-01-10 NOTE — Assessment & Plan Note (Signed)
Hemoglobin at 6.3 on 8/20 however did have some mild acute kidney injury with creatinine of 1.18 higher than baseline.  So likely some hemoconcentration and hemoglobin was actually lower than 6.3.  Only improved to to 6.8 following blood transfusion so given 1 additional unit of blood on 8/21 prior to discharge

## 2022-01-10 NOTE — Assessment & Plan Note (Signed)
Rate controlled.  Patient will hold on his Eliquis and resume in several weeks after he has been cleared by plastic surgery and wound is felt to have fully healed.  Patient has never had any previous bleeding episodes, so would not recommend permanent discontinuation of this medication.

## 2022-01-10 NOTE — ED Notes (Signed)
According to pt, Pt has a hematoma to right side of scalp that he was here for yesterday. The bleeding was stopped and pt was sent home. Pt states it began bleeding through the dressing this morning

## 2022-01-10 NOTE — Assessment & Plan Note (Addendum)
2D echo on 04/28/2021 showed EF of 60 to 65%.  Patient does not have leg edema.  CHF seem to be compensated. -Watch volume status closely -Check BNP

## 2022-01-10 NOTE — Assessment & Plan Note (Signed)
-   Continue home medications 

## 2022-01-10 NOTE — Assessment & Plan Note (Signed)
No chest pain -On Lipitor

## 2022-01-10 NOTE — H&P (Signed)
History and Physical    Bryan [name] FKC:[id number] DOB: 15-Mar-1941 DOA: 01/10/2022  Referring MD/NP/PA:   PCP: [name], MD   Patient coming from:  The patient is coming from home.     Chief Complaint: bleeding from scalp hematoma  HPI: Bryan [name] is a 81 y.o. male with medical history significant of A-fib on Eliquis, hypertension, hyperlipidemia, depression with anxiety, thrombocytopenia, s/p of thoracic aortic aneurysm repair, CAD, dCHF, anemia, IBS, CKD-3, who presents with bleeding from scalp hematoma.  Pt had fall with severe right ear laceration on 8/13. Patient was seen in ED on 8/13. His right ear laceration was repaired by ENT. He then developed scalp hematoma on the right side of head. He was seen in ED again on 8/19. He had gauze applied in ED for hemostasis for a small abrasion that patient had reportedly ripped off when he took his shirt off. Pt states that over the last 24 hours, the area has resumed bleeding. It has bled through significant amount of bandages. He has difficulty to stop bleeding. He has felt mildly weak and lightheaded. He stopped taking Eliquis yesterday morning, but still has active bleeding. Patient does not have chest pain, cough, shortness breath.  Patient has dry heaves, but no vomiting, diarrhea or abdominal pain.  No symptoms of UTI.  No new fall.  Data reviewed independently and ED Course: pt was found to have hemoglobin 9.0 on 12/15/2021 --> 7.2 on 01/09/2022 --> 6.3 today.  WBC 5.3, GFR> 60.  Temperature normal, blood pressure 131/63, heart rate 79, RR 18, oxygen saturation 94-100% on room air.  Patient is placed on telemetry bed for observation. Dr. Lysle Pearl of the surgery is consulted.   EKG: I have personally reviewed.  Atrial fibrillation, QTc 461, poor R wave progression.  Review of Systems:   General: no fevers, chills, no body weight gain, fatigue HEENT: no blurry vision, hearing changes or sore throat.  Has a scalp  hematoma on the right side of head with bleeding Respiratory: no dyspnea, coughing, wheezing CV: no chest pain, no palpitations GI: no nausea, vomiting, abdominal pain, diarrhea, constipation GU: no dysuria, burning on urination, increased urinary frequency, hematuria  Ext: no leg edema Neuro: no unilateral weakness, numbness, or tingling, no vision change or hearing loss.  Has lightheadedness. Skin: no rash. MSK: No muscle spasm, no deformity, no limitation of range of movement in spin Heme: No easy bruising.  Travel history: No recent long distant travel.   Allergy:  Allergies  Allergen Reactions   Penicillins Hives, Swelling and Other (See Comments)    SWELLING REACTION UNSPECIFIED PATIENT HAS TAKEN AMOXICILLIN ON MED HX FROM [facility] PATIENT HAS HAD A PCN REACTION WITH IMMEDIATE RASH, FACIAL/TONGUE/THROAT SWELLING, SOB, OR LIGHTHEADEDNESS WITH HYPOTENSION:  #  #  #  YES  #  #  #   Has patient had a PCN reaction causing severe rash involving mucus membranes or skin necrosis: No Has patient had a PCN reaction that required hospitalization No Has patient had a PCN reaction occurring within the last 10 years: No   Demerol [Meperidine] Hives and Nausea And Vomiting   Other     Past Medical History:  Diagnosis Date   Abnormal CT scan    Asymmetric left rectal wall thickening    Anemia    Anxiety    Arrhythmia    Basal cell carcinoma 04/28/2020   L forehead - ED&C 06/17/2020   Basal cell carcinoma 05/06/2021   left scalp  postauricular ant, [facility]   Basal cell carcinoma 05/06/2021   left scalp postauricular post, [facility]   CHF (congestive heart failure) (HCC)    Chronic lower back pain    Complication of anesthesia    Memory loss 09/2015   Coronary artery disease    Depression    GERD (gastroesophageal reflux disease)    Glaucoma    H/O thoracic aortic aneurysm repair    Headache    Heart murmur    History of being hospitalized    memory lose kidney funtion down blood pressure up    History of blood transfusion    "think he had one when he had heart valve OR" (10/14/2016); "none since" (10/19/2017)   History of chicken pox    Hyperlipidemia    Hypertension    Lumbar stenosis    Murmur    Neuropathy    Osteoarthritis    worse in feet and ankles   Paroxysmal A-fib ([place])    Poor short term memory    takes Aricept   Rheumatoid arthritis ([place])    "all over" (10/14/2016)   Schizophrenia ([place])    Seasonal allergies    Thoracic spine pain    Valvular heart disease    Wears glasses    reading    Past Surgical History:  Procedure Laterality Date   AORTIC VALVE REPLACEMENT  2007   [facility].  [place], [place]; "pig valve"   APPENDECTOMY  2010   BACK SURGERY     CARDIAC VALVE REPLACEMENT     CATARACT EXTRACTION W/ INTRAOCULAR LENS  IMPLANT, BILATERAL Bilateral 2012   COLONOSCOPY WITH PROPOFOL N/A 09/24/2016   Procedure: COLONOSCOPY WITH PROPOFOL;  Surgeon: Jonathon Bellows, MD;  Location: [facility] ENDOSCOPY;  Service: Endoscopy;  Laterality: N/A;   ESOPHAGOGASTRODUODENOSCOPY (EGD) WITH PROPOFOL N/A 09/24/2016   Procedure: ESOPHAGOGASTRODUODENOSCOPY (EGD) WITH PROPOFOL;  Surgeon: Jonathon Bellows, MD;  Location: [facility] ENDOSCOPY;  Service: Endoscopy;  Laterality: N/A;   GIVENS CAPSULE STUDY N/A 09/24/2016   Procedure: GIVENS CAPSULE STUDY;  Surgeon: Jonathon Bellows, MD;  Location: [facility] ENDOSCOPY;  Service: Endoscopy;  Laterality: N/A;   HAMMER TOE SURGERY Right 10/09/2015   Procedure: HAMMER TOE REPAIR WITH Bryan-WIRE FIXATION RIGHT SECOND TOE;  Surgeon: Albertine Patricia, DPM;  Location: [place];  Service: Podiatry;  Laterality: Right;  WITH LOCAL   HARDWARE REMOVAL N/A 11/02/2019   Procedure: Removal of hardware thoracic spine;  Surgeon: Kary Kos, MD;  Location: [place];  Service: Neurosurgery;  Laterality: N/A;  posterior   INGUINAL HERNIA REPAIR Right 05/05/2018   Medium Bard PerFix plug.  Surgeon: Robert Bellow, MD;  Location: [facility] ORS;  Service: General;  Laterality:  Right;   IR VERTEBROPLASTY CERV/THOR BX INC UNI/BIL INC/INJECT/IMAGING  10/27/2018   JOINT REPLACEMENT Left    left total knee   LAMINECTOMY WITH POSTERIOR LATERAL ARTHRODESIS LEVEL 3 N/A 09/28/2017   Procedure: LUMBAR  POSTEROR  FUSION REVISION - LUMBAR ONE -TWO, LUMBAR TWO -THREE,THREE-FOUR ,BILATERAL ARTHRODESIS REMOVAL LUMBAR THREE HARDWARE;  Surgeon: Kary Kos, MD;  Location: [place];  Service: Neurosurgery;  Laterality: N/A;   LAMINECTOMY WITH POSTERIOR LATERAL ARTHRODESIS LEVEL 3 N/A 10/20/2017   Procedure: Revision fusion and removal of hardware Lumbar one, Pedicle screw fixation thoracic ten-lumbar two, thoracic nine-ten laminotomy, Posterior Lumbar Arthrodesis thoracic ten-lumbar two ;  Surgeon: Kary Kos, MD;  Location: [place];  Service: Neurosurgery;  Laterality: N/A;   LAPAROSCOPIC CHOLECYSTECTOMY  2010   LUMBAR FUSION Right 06/08/2017   LUMBAR THREE-FOUR, LUMBAR  FOUR-FIVE POSTEROLATERAL ARTHRODESIS WITH RIGHT LUMBAR FOUR-FIVE LAMINECTOMY/FORAMINOTOMY   LUMBAR LAMINECTOMY/DECOMPRESSION MICRODISCECTOMY Right 03/08/2016   Procedure: Laminectomy and Foraminotomy - Lumbar Five-Sacral One Right;  Surgeon: Kary Kos, MD;  Location: [place];  Service: Neurosurgery;  Laterality: Right;  Right   MULTIPLE TOOTH EXTRACTIONS     "3"   OTOPLASATY Right 12/15/2021   Procedure: Right auricle repair;  Surgeon: Margaretha Sheffield, MD;  Location: [facility] ORS;  Service: ENT;  Laterality: Right;   POSTERIOR LUMBAR FUSION  10/14/2016   L5-S1   REPLACEMENT TOTAL KNEE Left 2003   THORACIC AORTIC ANEURYSM REPAIR  2010   TOTAL KNEE ARTHROPLASTY Right 08/2021    Social History:  reports that he quit smoking about 40 years ago. His smoking use included cigarettes. He has a 32.00 pack-year smoking history. He has never used smokeless tobacco. He reports that he does not currently use alcohol. He reports that he does not use drugs.  Family History:  Family History  Problem Relation Age of Onset   Heart failure  Mother    Hypertension Mother    Asthma Mother    Osteoporosis Mother    Heart attack Father 53       MI   Hypertension Sister    Prostate cancer Neg Hx    Bladder Cancer Neg Hx    Kidney cancer Neg Hx    Colon cancer Neg Hx      Prior to Admission medications   Medication Sig Start Date End Date Taking? Authorizing Provider  amLODipine (NORVASC) 5 MG tablet TAKE 1 TABLET BY MOUTH DAILY. 12/21/21   Minna Merritts, MD  apixaban (ELIQUIS) 5 MG TABS tablet Take 1 tablet (5 mg total) by mouth 2 (two) times daily. Please come to office for lab draw for Eliquis refills. 12/14/21   Minna Merritts, MD  atorvastatin (LIPITOR) 40 MG tablet TAKE 1 TABLET BY MOUTH DAILY 09/22/21   [name], MD  calcium carbonate (OS-CAL - DOSED IN MG OF ELEMENTAL CALCIUM) 1250 (500 Ca) MG tablet Take 1 tablet by mouth daily with lunch.    [provider]  donepezil (ARICEPT) 10 MG tablet Take 1 tablet by mouth at bedtime. 09/25/21   [provider]  fenofibrate 160 MG tablet TAKE ONE TABLET AT BEDTIME 09/22/21   Minna Merritts, MD  ferrous sulfate 325 (65 FE) MG tablet Take by mouth.    [provider]  FLUoxetine (PROZAC) 20 MG capsule TAKE 1 CAPSULE BY MOUTH ONCE DAILY 08/24/21   [name], MD  gabapentin (NEURONTIN) 300 MG capsule TAKE 1 CAPSULE BY MOUTH 3 TIMES DAILY. 07/09/21   [name], MD  HYDROcodone-acetaminophen (NORCO) 10-325 MG tablet Take 1 tablet by mouth 4 (four) times daily as needed for moderate pain.    [provider]  hydrocortisone cream 1 % Apply 1 application topically daily as needed for itching.    [provider]  ipratropium (ATROVENT) 0.06 % nasal spray Place 2 sprays into both nostrils 4 (four) times daily. 12/30/21   [name], MD  lisinopril (ZESTRIL) 20 MG tablet Take 30 mg by mouth daily. 03/08/16   [provider]  loperamide (IMODIUM) 2 MG capsule Take 4 mg by mouth in the morning and at bedtime.     [provider]  Multiple Vitamins-Minerals (PRESERVISION AREDS 2) CAPS Take 1 capsule by mouth daily.    [provider]  omeprazole (PRILOSEC) 40 MG capsule TAKE 1 CAPSULE EVERY DAY  06/25/21   Jonathon Bellows, MD  vitamin B-12 (CYANOCOBALAMIN) 1000 MCG tablet Take 1,000 mcg by mouth daily with lunch.     [provider]  Vitamin D, Ergocalciferol, (DRISDOL) 1.25 MG (50000 UNIT) CAPS capsule Take 1 capsule by mouth on Monday and Thursday. 01/01/22   [name], MD  vitamin E 180 MG (400 UNITS) capsule Take 800 Units by mouth daily with lunch.    [provider]    Physical Exam: Vitals:   01/10/22 1430 01/10/22 1500 01/10/22 1600 01/10/22 1630  BP: (!) 124/53 131/63 (!) 133/59 (!) 115/34  Pulse: 72 72 74 68  Resp:    17  Temp:    98 F (36.7 C)  TempSrc:    Oral  SpO2: 99% 100% 99% 100%  Weight:      Height:       General: Not in acute distress HEENT: Bleeding from scalp hematoma over right sided head has stopped by Dr. Lysle Pearl of surgery.       Eyes: PERRL, EOMI, no scleral icterus.       ENT: No discharge from the ears and nose, no pharynx injection, no tonsillar enlargement.        Neck: No JVD, no bruit, no mass felt. Heme: No neck lymph node enlargement. Cardiac: S1/S2, irregularly irregular rhythm, No gallops or rubs. Respiratory: No rales, wheezing, rhonchi or rubs. GI: Soft, nondistended, nontender, no rebound pain, no organomegaly, BS present. GU: No hematuria Ext: No pitting leg edema bilaterally. 1+DP/PT pulse bilaterally. Musculoskeletal: Has finger deformities, No joint redness or warmth, no limitation of ROM in spin. Skin: No rashes.  Neuro: Alert, oriented X3, cranial nerves II-XII grossly intact, moves all extremities normally. \ Psych: Patient is not psychotic, no suicidal or hemocidal ideation.  Labs on Admission: I have personally reviewed following labs and imaging studies  CBC: Recent Labs  Lab 01/09/22 1107  01/10/22 1521  WBC 4.3 5.3  NEUTROABS 3.8 4.7  HGB 7.2* 6.3*  HCT 23.3* 21.1*  MCV 99.1 101.0*  PLT 114* 450*   Basic Metabolic Panel: Recent Labs  Lab 01/09/22 1107 01/10/22 1521  NA 138 137  Bryan 4.2 4.8  CL 106 105  CO2 25 24  GLUCOSE 138* 162*  BUN 23 29*  CREATININE 0.93 1.18  CALCIUM 8.8* 8.4*   GFR: Estimated Creatinine Clearance: 46.9 mL/min (by C-G formula based on SCr of 1.18 mg/dL). Liver Function Tests: No results for input(s): "AST", "ALT", "ALKPHOS", "BILITOT", "PROT", "ALBUMIN" in the last 168 hours. No results for input(s): "LIPASE", "AMYLASE" in the last 168 hours. No results for input(s): "AMMONIA" in the last 168 hours. Coagulation Profile: No results for input(s): "INR", "PROTIME" in the last 168 hours. Cardiac Enzymes: No results for input(s): "CKTOTAL", "CKMB", "CKMBINDEX", "TROPONINI" in the last 168 hours. BNP (last 3 results) No results for input(s): "PROBNP" in the last 8760 hours. HbA1C: No results for input(s): "HGBA1C" in the last 72 hours. CBG: No results for input(s): "GLUCAP" in the last 168 hours. Lipid Profile: No results for input(s): "CHOL", "HDL", "LDLCALC", "TRIG", "CHOLHDL", "LDLDIRECT" in the last 72 hours. Thyroid Function Tests: No results for input(s): "TSH", "T4TOTAL", "FREET4", "T3FREE", "THYROIDAB" in the last 72 hours. Anemia Panel: No results for input(s): "VITAMINB12", "FOLATE", "FERRITIN", "TIBC", "IRON", "RETICCTPCT" in the last 72 hours. Urine analysis:    Component Value Date/Time   COLORURINE YELLOW 10/27/2018 [place] 10/27/2018 0644   APPEARANCEUR Clear 03/28/2014 1145   LABSPEC 1.023 10/27/2018  [place] 1.016 03/28/2014 1145   PHURINE 5.0 10/27/2018 [place] 10/27/2018 0644   GLUCOSEU Negative 03/28/2014 [place] 10/27/2018 [place] 10/27/2018 0644   BILIRUBINUR Small 11/21/2015 1620   BILIRUBINUR Negative 03/28/2014 [place] 10/27/2018 0644   PROTEINUR 100 (A) 10/27/2018 0644   UROBILINOGEN 0.2 11/21/2015 1620   NITRITE NEGATIVE 10/27/2018 0644   LEUKOCYTESUR NEGATIVE 10/27/2018 0644   LEUKOCYTESUR Negative 03/28/2014 1145   Sepsis Labs: '@LABRCNTIP'$ (procalcitonin:4,lacticidven:4) )No results found for this or any previous visit (from the past 240 hour(s)).   Radiological Exams on Admission: No results found.    Assessment/Plan Principal Problem:   Scalp hematoma Active Problems:   Acute blood loss anemia   Iron deficiency anemia   Chronic diastolic CHF (congestive heart failure) (HCC)   Thrombocytopenia (HCC)   Essential hypertension   Atrial fibrillation, chronic (HCC)   Hyperlipidemia   Coronary artery disease   Chronic kidney disease, stage 3a ([place])   Depression with anxiety   Assessment and Plan: * Scalp hematoma Scalp hematoma with bleeding and acute blood loss anemia: Hemoglobin dropped from 9.0 to 6.3.  Bleeding was stopped by Dr. Lysle Pearl of surgery. Likely will need plastics referral for the large defect per Dr. Lysle Pearl  -Placed on telemetry bed for obs -repeat CBC in AM -transfuse 1 Unit of blood now -Continue iron supplement -Hold Eliquis -Antibiotics for prophylaxis: Initially Ancef is ordered by ED physician, but patient is allergic to penicillin with severe reaction.  I changed to clindamycin after discussed with pharmacist.   Acute blood loss anemia - See above  Iron deficiency anemia -See above - Continue iron supplement  Chronic diastolic CHF (congestive heart failure) ([place]) 2D echo on 04/28/2021 showed EF of 60 to 65%.  Patient does not have leg edema.  CHF seem to be compensated. -Watch volume status closely -Check BNP  Thrombocytopenia ([place]) This is chronic issue.  Platelets 105 -Follow-up with CBC  Essential hypertension - IV hydralazine as needed -Continue home amlodipine -Hold lisinopril due to worsening anemia  Atrial fibrillation, chronic  (HCC) Heart rate 79 - Hold Eliquis  Hyperlipidemia - Fenofibrate, Lipitor  Coronary artery disease No chest pain -On Lipitor  Chronic kidney disease, stage 3a (HCC) Stable, GFR> 60 today -Follow-up with BMP  Depression with anxiety - Continue home medications          DVT ppx: SCD  Code Status: Full code per pt  Family Communication:   Yes, patient's brother-in-law and friend   at bed side.      Disposition Plan:  Anticipate discharge back to previous environment  Consults called:  Dr. Lysle Pearl of the surgery is consulted.  Admission status and Level of care: Telemetry Medical:    Med-surg bed for obs as inpt    progressive unit for obs   as inpt      SDU/inpation          Dispo: The patient is from: Home              Anticipated d/c is to: Home              Anticipated d/c date is: 1 day              Patient currently is not medically stable to d/c.    Severity of Illness:  The appropriate patient status for this patient is OBSERVATION. Observation status is judged to be reasonable  and necessary in order to provide the required intensity of service to ensure the patient's safety. The patient's presenting symptoms, physical exam findings, and initial radiographic and laboratory data in the context of their medical condition is felt to place them at decreased risk for further clinical deterioration. Furthermore, it is anticipated that the patient will be medically stable for discharge from the hospital within 2 midnights of admission.        Date of Service 01/10/2022    [facility]   If 7PM-7AM, please contact night-coverage [email or web address] 01/10/2022, 5:04 PM

## 2022-01-10 NOTE — Consult Note (Signed)
Subjective:   CC: scalp hematoma  HPI:  Bryan [name] is a 81 y.o. male who was consulted by Ellender Hose for evaluation of above.  Hx of recent fall, on eliquis.  This is his third return to ED for recurrent bleeding episodes.  Seems to stop and then starts again shortly after d/c.  Stopped eliquis yesterday.   Past Medical History:  has a past medical history of Abnormal CT scan, Anemia, Anxiety, Arrhythmia, Basal cell carcinoma (04/28/2020), Basal cell carcinoma (05/06/2021), Basal cell carcinoma (05/06/2021), CHF (congestive heart failure) (HCC), Chronic lower back pain, Complication of anesthesia, Coronary artery disease, Depression, GERD (gastroesophageal reflux disease), Glaucoma, H/O thoracic aortic aneurysm repair, Headache, Heart murmur, History of being hospitalized, History of blood transfusion, History of chicken pox, Hyperlipidemia, Hypertension, Lumbar stenosis, Murmur, Neuropathy, Osteoarthritis, Paroxysmal A-fib (HCC), Poor short term memory, Rheumatoid arthritis ([place]), Schizophrenia ([place]), Seasonal allergies, Thoracic spine pain, Valvular heart disease, and Wears glasses.  Past Surgical History:  has a past surgical history that includes Thoracic aortic aneurysm repair (2010); Aortic valve replacement (2007); Appendectomy (2010); Replacement total knee (Left, 2003); Hammer toe surgery (Right, 10/09/2015); Cataract extraction w/ intraocular lens  implant, bilateral (Bilateral, 2012); Multiple tooth extractions; Lumbar laminectomy/decompression microdiscectomy (Right, 03/08/2016); Esophagogastroduodenoscopy (egd) with propofol (N/A, 09/24/2016); Colonoscopy with propofol (N/A, 09/24/2016); Givens capsule study (N/A, 09/24/2016); Back surgery; Posterior lumbar fusion (10/14/2016); Laparoscopic cholecystectomy (2010); Cardiac valve replacement; Lumbar fusion (Right, 06/08/2017); Laminectomy with posterior lateral arthrodesis level 3 (N/A, 09/28/2017); Laminectomy with posterior lateral  arthrodesis level 3 (N/A, 10/20/2017); Joint replacement (Left); Inguinal hernia repair (Right, 05/05/2018); IR VERTEBROPLASTY CERV/THOR BX INC UNI/BIL INC/INJECT/IMAGING (10/27/2018); Hardware Removal (N/A, 11/02/2019); Total knee arthroplasty (Right, 08/2021); and Otoplasty (Right, 12/15/2021).  Family History: family history includes Asthma in his mother; Heart attack (age of onset: 48) in his father; Heart failure in his mother; Hypertension in his mother and sister; Osteoporosis in his mother.  Social History:  reports that he quit smoking about 40 years ago. His smoking use included cigarettes. He has a 32.00 pack-year smoking history. He has never used smokeless tobacco. He reports that he does not currently use alcohol. He reports that he does not use drugs.  Current Medications:  Prior to Admission medications   Medication Sig Start Date End Date Taking? Authorizing Provider  amLODipine (NORVASC) 5 MG tablet TAKE 1 TABLET BY MOUTH DAILY. 12/21/21   Minna Merritts, MD  apixaban (ELIQUIS) 5 MG TABS tablet Take 1 tablet (5 mg total) by mouth 2 (two) times daily. Please come to office for lab draw for Eliquis refills. 12/14/21   Minna Merritts, MD  atorvastatin (LIPITOR) 40 MG tablet TAKE 1 TABLET BY MOUTH DAILY 09/22/21   [name], MD  calcium carbonate (OS-CAL - DOSED IN MG OF ELEMENTAL CALCIUM) 1250 (500 Ca) MG tablet Take 1 tablet by mouth daily with lunch.    [provider]  donepezil (ARICEPT) 10 MG tablet Take 1 tablet by mouth at bedtime. 09/25/21   [provider]  fenofibrate 160 MG tablet TAKE ONE TABLET AT BEDTIME 09/22/21   Minna Merritts, MD  ferrous sulfate 325 (65 FE) MG tablet Take by mouth.    [provider]  FLUoxetine (PROZAC) 20 MG capsule TAKE 1 CAPSULE BY MOUTH ONCE DAILY 08/24/21   [name], MD  gabapentin (NEURONTIN) 300 MG capsule TAKE 1 CAPSULE BY MOUTH 3 TIMES DAILY. 07/09/21   [name], MD   HYDROcodone-acetaminophen (NORCO) 10-325 MG tablet Take 1  tablet by mouth 4 (four) times daily as needed for moderate pain.    [provider]  hydrocortisone cream 1 % Apply 1 application topically daily as needed for itching.    [provider]  ipratropium (ATROVENT) 0.06 % nasal spray Place 2 sprays into both nostrils 4 (four) times daily. 12/30/21   [name], MD  lisinopril (ZESTRIL) 20 MG tablet Take 30 mg by mouth daily. 03/08/16   [provider]  loperamide (IMODIUM) 2 MG capsule Take 4 mg by mouth in the morning and at bedtime.    [provider]  Multiple Vitamins-Minerals (PRESERVISION AREDS 2) CAPS Take 1 capsule by mouth daily.    [provider]  omeprazole (PRILOSEC) 40 MG capsule TAKE 1 CAPSULE EVERY DAY 06/25/21   Jonathon Bellows, MD  vitamin B-12 (CYANOCOBALAMIN) 1000 MCG tablet Take 1,000 mcg by mouth daily with lunch.     [provider]  Vitamin D, Ergocalciferol, (DRISDOL) 1.25 MG (50000 UNIT) CAPS capsule Take 1 capsule by mouth on Monday and Thursday. 01/01/22   [name], MD  vitamin E 180 MG (400 UNITS) capsule Take 800 Units by mouth daily with lunch.    [provider]    Allergies:  Allergies  Allergen Reactions   Penicillins Hives, Swelling and Other (See Comments)    SWELLING REACTION UNSPECIFIED PATIENT HAS TAKEN AMOXICILLIN ON MED HX FROM [facility] PATIENT HAS HAD A PCN REACTION WITH IMMEDIATE RASH, FACIAL/TONGUE/THROAT SWELLING, SOB, OR LIGHTHEADEDNESS WITH HYPOTENSION:  #  #  #  YES  #  #  #   Has patient had a PCN reaction causing severe rash involving mucus membranes or skin necrosis: No Has patient had a PCN reaction that required hospitalization No Has patient had a PCN reaction occurring within the last 10 years: No   Demerol [Meperidine] Hives and Nausea And Vomiting   Other     ROS:  General: Denies weight loss, weight gain, fatigue, fevers, chills, and night sweats. Eyes:  Denies blurry vision, double vision, eye pain, itchy eyes, and tearing. Ears: Denies hearing loss, earache, and ringing in ears. Nose: Denies sinus pain, congestion, infections, runny nose, and nosebleeds. Mouth/throat: Denies hoarseness, sore throat, bleeding gums, and difficulty swallowing. Heart: Denies chest pain, palpitations, racing heart, irregular heartbeat, leg pain or swelling, and decreased activity tolerance. Respiratory: Denies breathing difficulty, shortness of breath, wheezing, cough, and sputum. GI: Denies change in appetite, heartburn, nausea, vomiting, constipation, diarrhea, and blood in stool. GU: Denies difficulty urinating, pain with urinating, urgency, frequency, blood in urine. Musculoskeletal: Denies joint stiffness, pain, swelling, muscle weakness. Skin: Denies rash, itching, mass, tumors, sores, and boils Neurologic: Denies headache, fainting, dizziness, seizures, numbness, and tingling. Psychiatric: Denies depression, anxiety, difficulty sleeping, and memory loss. Endocrine: Denies heat or cold intolerance, and increased thirst or urination. Blood/lymph: Denies easy bruising, easy bruising, and swollen glands     Objective:     BP 131/63   Pulse 72   Temp 97.7 F (36.5 C) (Oral)   Resp 18   Ht 6' (1.829 m)   Wt 69.9 kg   SpO2 100%   BMI 20.90 kg/m   Constitutional :  alert, cooperative, appears stated age, and no distress  Lymphatics/Throat:  no asymmetry, masses, or scars  Respiratory:  clear to auscultation bilaterally  Cardiovascular:  regular rate and rhythm  Gastrointestinal: soft, non-tender; bowel sounds normal; no masses,  no organomegaly.  Musculoskeletal: Steady gait and movement  Skin: Cool and moist,  right temporal scalp wound that is open down to bone, measuring approx 3cm x 3.5 cm.  No residual hematoma noted circumfrentially.  Lifting the flap measurign approx 2cm beyond wound edge did not visualize obvious bleeding vessels.   Psychiatric: Normal affect, non-agitated, not confused       LABS:     Latest Ref Rng & Units 01/10/2022    3:21 PM 01/09/2022   11:07 AM 12/30/2021    3:37 PM  CMP  Glucose 70 - 99 mg/dL 162  138  105   BUN 8 - 23 mg/dL '29  23  17   '$ Creatinine 0.61 - 1.24 mg/dL 1.18  0.93  0.93   Sodium 135 - 145 mmol/L 137  138  136   Potassium 3.5 - 5.1 mmol/L 4.8  4.2  4.5   Chloride 98 - 111 mmol/L 105  106  97   CO2 22 - 32 mmol/L '24  25  24   '$ Calcium 8.9 - 10.3 mg/dL 8.4  8.8  9.0       Latest Ref Rng & Units 01/10/2022    3:21 PM 01/09/2022   11:07 AM 12/15/2021   10:01 AM  CBC  WBC 4.0 - 10.5 K/uL 5.3  4.3    Hemoglobin 13.0 - 17.0 g/dL 6.3  7.2  9.0   Hematocrit 39.0 - 52.0 % 21.1  23.3  28.7   Platelets 150 - 400 K/uL 105  114      RADS: N/a  Assessment:      Scalp hematoma and bleeding-recurrent  Plan:    No evidence of active bleeding at this time.  Surgicell and arista powder infused in the area reported to be bleeding, toward the anterior flap, 4x4 gauze applied in pressure dressing configuration and tightly wrapped with coband.  Recommend keeping dressing intact for at least 48hrs prior to removal.  Pending transfusion and observation in the meantime.  Will reassess wound in a couple days, but likely will need plastics referral for the large defect  The patient verbalized understanding and all questions were answered to the patient's satisfaction.  labs/images/medications/previous chart entries reviewed personally and relevant changes/updates noted above.

## 2022-01-11 DIAGNOSIS — N179 Acute kidney failure, unspecified: Secondary | ICD-10-CM | POA: Diagnosis not present

## 2022-01-11 DIAGNOSIS — D62 Acute posthemorrhagic anemia: Secondary | ICD-10-CM | POA: Diagnosis not present

## 2022-01-11 DIAGNOSIS — I482 Chronic atrial fibrillation, unspecified: Secondary | ICD-10-CM | POA: Diagnosis not present

## 2022-01-11 DIAGNOSIS — F0390 Unspecified dementia without behavioral disturbance: Secondary | ICD-10-CM | POA: Diagnosis present

## 2022-01-11 DIAGNOSIS — S0003XA Contusion of scalp, initial encounter: Secondary | ICD-10-CM | POA: Diagnosis not present

## 2022-01-11 LAB — CBC
HCT: 21.3 % — ABNORMAL LOW (ref 39.0–52.0)
Hemoglobin: 6.8 g/dL — ABNORMAL LOW (ref 13.0–17.0)
MCH: 29.8 pg (ref 26.0–34.0)
MCHC: 31.9 g/dL (ref 30.0–36.0)
MCV: 93.4 fL (ref 80.0–100.0)
Platelets: 101 10*3/uL — ABNORMAL LOW (ref 150–400)
RBC: 2.28 MIL/uL — ABNORMAL LOW (ref 4.22–5.81)
RDW: 21.1 % — ABNORMAL HIGH (ref 11.5–15.5)
WBC: 4.2 10*3/uL (ref 4.0–10.5)
nRBC: 0 % (ref 0.0–0.2)

## 2022-01-11 LAB — PREPARE RBC (CROSSMATCH)

## 2022-01-11 LAB — HEMOGLOBIN AND HEMATOCRIT, BLOOD
HCT: 27.1 % — ABNORMAL LOW (ref 39.0–52.0)
Hemoglobin: 8.6 g/dL — ABNORMAL LOW (ref 13.0–17.0)

## 2022-01-11 MED ORDER — HYDROCODONE-ACETAMINOPHEN 5-325 MG PO TABS
1.0000 | ORAL_TABLET | Freq: Four times a day (QID) | ORAL | Status: DC | PRN
  Administered 2022-01-11 (×2): 1 via ORAL
  Filled 2022-01-11 (×2): qty 1

## 2022-01-11 MED ORDER — TRAMADOL HCL 50 MG PO TABS
50.0000 mg | ORAL_TABLET | Freq: Four times a day (QID) | ORAL | Status: DC | PRN
  Administered 2022-01-11: 50 mg via ORAL
  Filled 2022-01-11: qty 1

## 2022-01-11 MED ORDER — SODIUM CHLORIDE 0.9% IV SOLUTION
Freq: Once | INTRAVENOUS | Status: AC
Start: 2022-01-11 — End: 2022-01-11

## 2022-01-11 MED ORDER — FUROSEMIDE 10 MG/ML IJ SOLN
20.0000 mg | Freq: Once | INTRAMUSCULAR | Status: AC
Start: 2022-01-11 — End: 2022-01-11
  Administered 2022-01-11: 20 mg via INTRAVENOUS
  Filled 2022-01-11: qty 2

## 2022-01-11 MED ORDER — HYDROCODONE-ACETAMINOPHEN 10-325 MG PO TABS: 1.0000 | ORAL_TABLET | Freq: Four times a day (QID) | ORAL | Status: DC | PRN

## 2022-01-11 NOTE — Assessment & Plan Note (Signed)
Secondary to blood loss anemia.  Expected to have resolved following transfusion

## 2022-01-11 NOTE — Assessment & Plan Note (Signed)
Continue Aricept 

## 2022-01-11 NOTE — Progress Notes (Addendum)
Subjective:  CC: Bryan [name] is a 81 y.o. male  Hospital stay day 0,   scalp hematoma  HPI: No acute issues. Complains of headache  ROS:  General: Denies weight loss, weight gain, fatigue, fevers, chills, and night sweats. Heart: Denies chest pain, palpitations, racing heart, irregular heartbeat, leg pain or swelling, and decreased activity tolerance. Respiratory: Denies breathing difficulty, shortness of breath, wheezing, cough, and sputum. GI: Denies change in appetite, heartburn, nausea, vomiting, constipation, diarrhea, and blood in stool. GU: Denies difficulty urinating, pain with urinating, urgency, frequency, blood in urine.   Objective:   Temp:  [97.7 F (36.5 C)-98.4 F (36.9 C)] 98.2 F (36.8 C) (08/21 0759) Pulse Rate:  [63-75] 63 (08/21 0759) Resp:  [15-22] 15 (08/21 0759) BP: (110-148)/(34-108) 128/65 (08/21 0759) SpO2:  [94 %-100 %] 94 % (08/21 0759) Weight:  [69.9 kg-72.6 kg] 72.6 kg (08/21 0500)     Height: 6' (182.9 cm) Weight: 72.6 kg BMI (Calculated): 21.7   Intake/Output this shift:   Intake/Output Summary (Last 24 hours) at 01/11/2022 0946 Last data filed at 01/11/2022 0300 Gross per 24 hour  Intake 400 ml  Output 300 ml  Net 100 ml    Constitutional :  alert, cooperative, appears stated age, and no distress  Respiratory:  clear to auscultation bilaterally  Cardiovascular:  regular rate and rhythm     Skin: Cool and moist. Scalp dressing intact, no evidence of further bleeding on gauze or development of recurrent hematoma in the area based on palpation.  Psychiatric: Normal affect, non-agitated, not confused       LABS:     Latest Ref Rng & Units 01/10/2022    3:21 PM 01/09/2022   11:07 AM 12/30/2021    3:37 PM  CMP  Glucose 70 - 99 mg/dL 162  138  105   BUN 8 - 23 mg/dL '29  23  17   '$ Creatinine 0.61 - 1.24 mg/dL 1.18  0.93  0.93   Sodium 135 - 145 mmol/L 137  138  136   Potassium 3.5 - 5.1 mmol/L 4.8  4.2  4.5   Chloride 98 - 111 mmol/L  105  106  97   CO2 22 - 32 mmol/L '24  25  24   '$ Calcium 8.9 - 10.3 mg/dL 8.4  8.8  9.0       Latest Ref Rng & Units 01/11/2022    6:04 AM 01/10/2022    3:21 PM 01/09/2022   11:07 AM  CBC  WBC 4.0 - 10.5 K/uL 4.2  5.3  4.3   Hemoglobin 13.0 - 17.0 g/dL 6.8  6.3  7.2   Hematocrit 39.0 - 52.0 % 21.3  21.1  23.3   Platelets 150 - 400 K/uL 101  105  114     RADS: N/a Assessment:   Scalp hematoma- seems to have resolved now.  Recommend staying off eliquis until seen by plastics in [place] to discuss repair options/wound care options for the scalp defect now present.  Keep current dressing intact for another 24hrs prior to starting daily wet to dry dressing changes. Apply purified water damped 4x4 gauze over wound on scalp, cover with one additional dry gauze, then secure in place using coband.  Change dressing daily until seen by plastics service.   Further care per primary team regarding persistent anemia. Please call with additional questions or concerns  labs/images/medications/previous chart entries reviewed personally and relevant changes/updates noted above.

## 2022-01-11 NOTE — Hospital Course (Signed)
Patient is an 81 year old male with past medical history of atrial fibrillation on Eliquis, hypertension, diastolic CHF and stage IIIa chronic kidney disease who presented to the emergency room on 8/13 initially after a fall with a severe right ear laceration.  At that time, laceration was repaired by ENT.  He was then sent home, but then a few days later, developed a scalp hematoma when he was home and took his shirt off and accidentally ripped off the scab.  Presented back to the emergency room on 8/19.  Patient had gauze applied for hemostasis and sent home, but then over the last 24 hours the area has resumed bleeding and blood there is significant amount of his bandages and was difficult to stop the bleeding.  Patient presented to the emergency department on 8/20 and found to have a hemoglobin of 6.3.  Was 7.2 on 8/19 (and was 9.0 on 7/25.)  Patient had Surgicel and Arista powder infused to the area which was reported bleeding and a 4 x 4 gauze was applied with pressure dressing and tightly wrapped with Coban.  No further bleeding.  Seen by general surgery.  Patient brought in for further evaluation.  Eliquis was held and patient transfused 1 unit packed red blood cells.  By following day, hemoglobin only at 6.8.  Patient given another unit of packed red blood cells prior to discharge.  Felt to be medically stable for discharge on afternoon of 8/21.

## 2022-01-11 NOTE — Progress Notes (Signed)
Reviewed MD order to discharge patient to home, reviewed discharge instructions, wound care and follow up appointments with patient and patient verbalized understanding.

## 2022-01-11 NOTE — Discharge Instructions (Signed)
Apply purified water damped 4x4 gauze over wound on scalp, cover with one additional dry gauze, then secure in place using coband.  Change dressing daily until seen by plastics service.  If area starts bleeding, apply couple clean gauze over the wound and apply broad pressure over area with palm of your hands and hold it for 60mn.  If wound is still bleeding afterwards, call for help

## 2022-01-11 NOTE — Discharge Summary (Signed)
Physician Discharge Summary   Patient: Bryan [name] MRN: [id number] DOB: Nov 05, 1940  Admit date:     01/10/2022  Discharge date: 01/11/22  Discharge Physician: Annita Brod   PCP: [name], MD   Recommendations at discharge:   Patient given wound care instructions: Keep pressure dressing for another 24 more hours.  Then change with wet-to-dry dressings daily until see plastic surgery Patient given several plastic surgeons to follow-up with in [facility] where he will be staying with his sister Patient to hold on Eliquis until seen by plastic surgery and wound has healed  Discharge Diagnoses: Principal Problem:   Scalp hematoma Active Problems:   Chronic diastolic CHF (congestive heart failure) (HCC)   Acute blood loss anemia   Thrombocytopenia (HCC)   Atrial fibrillation, chronic (HCC)   AKI (acute kidney injury) ([place])   Essential hypertension   Iron deficiency anemia   Chronic kidney disease (CKD), stage II (mild)   Coronary artery disease   Hyperlipidemia   Depression with anxiety   Senile dementia uncomp, without behavioral disturbance ([place])  Resolved Problems:   * No resolved hospital problems. [facility] Course: Patient is an 81 year old male with past medical history of atrial fibrillation on Eliquis, hypertension, diastolic CHF and stage IIIa chronic kidney disease who presented to the emergency room on 8/13 initially after a fall with a severe right ear laceration.  At that time, laceration was repaired by ENT.  He was then sent home, but then a few days later, developed a scalp hematoma when he was home and took his shirt off and accidentally ripped off the scab.  Presented back to the emergency room on 8/19.  Patient had gauze applied for hemostasis and sent home, but then over the last 24 hours the area has resumed bleeding and blood there is significant amount of his bandages and was difficult to stop the bleeding.  Patient presented to the  emergency department on 8/20 and found to have a hemoglobin of 6.3.  Was 7.2 on 8/19 (and was 9.0 on 7/25.)  Patient had Surgicel and Arista powder infused to the area which was reported bleeding and a 4 x 4 gauze was applied with pressure dressing and tightly wrapped with Coban.  No further bleeding.  Seen by general surgery.  Patient brought in for further evaluation.  Eliquis was held and patient transfused 1 unit packed red blood cells.  By following day, hemoglobin only at 6.8.  Patient given another unit of packed red blood cells prior to discharge.  Felt to be medically stable for discharge on afternoon of 8/21.  Assessment and Plan: * Scalp hematoma Scalp hematoma with bleeding and acute blood loss anemia: Hemoglobin dropped from 9.0 to 6.3.  Bleeding was stopped by Dr. Lysle Pearl of surgery. Likely will need plastics referral for the large defect per Dr. Lysle Pearl.  Acute blood loss anemia Hemoglobin at 6.3 on 8/20 however did have some mild acute kidney injury with creatinine of 1.18 higher than baseline.  So likely some hemoconcentration and hemoglobin was actually lower than 6.3.  Only improved to to 6.8 following blood transfusion so given 1 additional unit of blood on 8/21 prior to discharge  Chronic diastolic CHF (congestive heart failure) ([place]) 2D echo on 04/28/2021 showed EF of 60 to 65%.  Patient does not have leg edema.  CHF seem to be compensated.  BNP with minimal elevation.  Given Lasix after blood transfusion 8/21  Thrombocytopenia ([place]) This is chronic issue.  Platelets 105 -Follow-up with CBC  Atrial fibrillation, chronic (HCC) Rate controlled.  Patient will hold on his Eliquis and resume in several weeks after he has been cleared by plastic surgery and wound is felt to have fully healed.  Patient has never had any previous bleeding episodes, so would not recommend permanent discontinuation of this medication.  AKI (acute kidney injury) ([place]) Secondary to blood loss anemia.   Expected to have resolved following transfusion  Essential hypertension Initially held his lisinopril due to mild acute kidney injury and normal blood pressures.  With blood, blood pressure has been trending upward and patient will resume his blood pressure medications on discharge  Iron deficiency anemia -See above - Continue iron supplement  Chronic kidney disease (CKD), stage II (mild) Baseline with GFR greater than 60.  Acute kidney injury as above.  Coronary artery disease No chest pain -On Lipitor  Depression with anxiety - Continue home medications  Hyperlipidemia - Fenofibrate, Lipitor  Senile dementia uncomp, without behavioral disturbance ([place]) Continue Aricept         Consultants: General surgery Procedures performed: None Disposition: Relative's home Diet recommendation:  Discharge Diet Orders (From admission, onward)     Start     Ordered   01/11/22 0000  Diet - low sodium heart healthy        01/11/22 1518           Cardiac diet DISCHARGE MEDICATION: Allergies as of 01/11/2022       Reactions   Penicillins Hives, Swelling, Other (See Comments)   SWELLING REACTION UNSPECIFIED PATIENT HAS TAKEN AMOXICILLIN ON MED HX FROM [facility] PATIENT HAS HAD A PCN REACTION WITH IMMEDIATE RASH, FACIAL/TONGUE/THROAT SWELLING, SOB, OR LIGHTHEADEDNESS WITH HYPOTENSION:  #  #  #  YES  #  #  #   Has patient had a PCN reaction causing severe rash involving mucus membranes or skin necrosis: No Has patient had a PCN reaction that required hospitalization No Has patient had a PCN reaction occurring within the last 10 years: No   Demerol [meperidine] Hives, Nausea And Vomiting   Other         Medication List     STOP taking these medications    apixaban 5 MG Tabs tablet Commonly known as: Eliquis       TAKE these medications    amLODipine 5 MG tablet Commonly known as: NORVASC TAKE 1 TABLET BY MOUTH DAILY.   atorvastatin 40 MG tablet Commonly known as:  LIPITOR TAKE 1 TABLET BY MOUTH DAILY   calcium carbonate 1250 (500 Ca) MG tablet Commonly known as: OS-CAL - dosed in mg of elemental calcium Take 1 tablet by mouth daily with lunch.   cyanocobalamin 1000 MCG tablet Commonly known as: VITAMIN B12 Take 1,000 mcg by mouth daily with lunch.   donepezil 10 MG tablet Commonly known as: ARICEPT Take 1 tablet by mouth at bedtime.   fenofibrate 160 MG tablet TAKE ONE TABLET AT BEDTIME   ferrous sulfate 325 (65 FE) MG tablet Take by mouth.   FLUoxetine 20 MG capsule Commonly known as: PROZAC TAKE 1 CAPSULE BY MOUTH ONCE DAILY   gabapentin 300 MG capsule Commonly known as: NEURONTIN TAKE 1 CAPSULE BY MOUTH 3 TIMES DAILY.   HYDROcodone-acetaminophen 10-325 MG tablet Commonly known as: NORCO Take 1 tablet by mouth 4 (four) times daily as needed for moderate pain.   hydrocortisone cream 1 % Apply 1 application topically daily as needed for itching.   ipratropium 0.06 % nasal  spray Commonly known as: ATROVENT Place 2 sprays into both nostrils 4 (four) times daily.   lisinopril 20 MG tablet Commonly known as: ZESTRIL Take 30 mg by mouth daily.   loperamide 2 MG capsule Commonly known as: IMODIUM Take 4 mg by mouth in the morning and at bedtime.   loratadine 10 MG tablet Commonly known as: CLARITIN Take 10 mg by mouth daily.   omeprazole 40 MG capsule Commonly known as: PRILOSEC TAKE 1 CAPSULE EVERY DAY   PreserVision AREDS 2 Caps Take 1 capsule by mouth daily.   Vitamin D (Ergocalciferol) 1.25 MG (50000 UNIT) Caps capsule Commonly known as: DRISDOL Take 1 capsule by mouth on Monday and Thursday.   vitamin E 180 MG (400 UNITS) capsule Take 800 Units by mouth daily with lunch.               Discharge Care Instructions  (From admission, onward)           Start     Ordered   01/11/22 0000  Discharge wound care:       Comments: Keep current dressing intact for another 24hrs. Then starting on 8/22  morning, daily wet to dry dressing changes.  Apply purified water damped 4x4 gauze over wound on scalp, cover with one additional dry gauze, then secure in place using coband.  Change dressing daily until seen by plastics service.       01/11/22 1518            Follow-up Information     [facility] Follow up.   Contact information: [phone]        Thedora Hinders, MD Follow up.   Specialty: Plastic Surgery Why: Also has office in [facility] information: [address] [place] [address] [phone]                Discharge Exam: Danley Danker Weights   01/10/22 1233 01/11/22 0500  Weight: 69.9 kg 72.6 kg   General: Alert and oriented x2, no acute distress Cardiovascular: Irregular rhythm, rate controlled Clear to bilaterally  Condition at discharge: good  The results of significant diagnostics from this hospitalization (including imaging, microbiology, ancillary and laboratory) are listed below for reference.   Imaging Studies: DG Bone Density  Result Date: 12/28/2021 EXAM: DUAL X-RAY ABSORPTIOMETRY (DXA) FOR BONE MINERAL DENSITY IMPRESSION: Your patient Bronte Kropf completed a BMD test on 12/28/2021 using the [employer] (software version: 14.10) manufactured by [employer]. The following summarizes the results of our evaluation. Technologist: SCE PATIENT BIOGRAPHICAL: Name: [name], Bryan Patient ID: [id number] Birth Date: 01/09/1941 Height: 69.5 in. Gender: Male Exam Date: 12/28/2021 Weight: 153.7 lbs. Indications: Advanced Age, Caucasian, Height Loss, History of Fracture (Adult), History of Spinal Surgery, Hypogonadism, neuropathy, Osteoporotic, Rheumatoid Arthritis, Vitamin D Deficiency Fractures: Left wrist Treatments: Calcium, Gabapentin, Multi-Vitamin, Testosterone, Vitamin D DENSITOMETRY RESULTS: Site          Region     Measured Date Measured Age WHO Classification Young Adult  T-score BMD         %Change vs. Previous Significant Change (*) DualFemur Neck Left 12/28/2019 83.5 Osteoporosis -3.2 0.659 g/cm2 -12.3% Yes DualFemur Neck Left 04/16/2019 80.8 Osteoporosis -2.5 0.751 g/cm2 5.9% - DualFemur Neck Left 04/13/2018 79.8 Osteoporosis -2.8 0.709 g/cm2 - - DualFemur Total Mean 12/28/2021 83.5 Osteoporosis -2.8 0.691 g/cm2 -4.3% Yes DualFemur Total Mean 04/16/2019 80.8 Osteoporosis -2.6 0.722 g/cm2 2.7% Yes DualFemur Total Mean 04/13/2018 79.8 Osteoporosis -2.8  0.703 g/cm2 - - Right Forearm Radius 33% 12/28/2021 83.5 Osteoporosis -3.5 0.647 g/cm2 -7.0% Yes Right Forearm Radius 33% 04/16/2019 80.8 Osteoporosis -3.0 0.696 g/cm2 -5.4% Yes Right Forearm Radius 33% 04/13/2018 79.8 Osteoporosis -2.6 0.736 g/cm2 - - ASSESSMENT: The BMD measured at Forearm Radius 33% is 0.647 g/cm2 with a T-score of -3.5. This patient is considered osteoporotic according to the [facility]) criteria. Lumbar spine was not utilized due to advanced degenerative changes and previous surgery. Compared with prior study, there has been a significant decrease in the total hip. The scan quality is good. World Pharmacologist [facility]) criteria for post-menopausal, Caucasian Women: Normal:                   T-score at or above -1 SD Osteopenia/low bone mass: T-score between -1 and -2.5 SD Osteoporosis:             T-score at or below -2.5 SD RECOMMENDATIONS: 1. All patients should optimize calcium and vitamin D intake. 2. Consider FDA-approved medical therapies in postmenopausal women and men aged 38 years and older, based on the following: a. A hip or vertebral(clinical or morphometric) fracture b. T-score < -2.5 at the femoral neck or spine after appropriate evaluation to exclude secondary causes c. Low bone mass (T-score between -1.0 and -2.5 at the femoral neck or spine) and a 10-year probability of a hip fracture > 3% or a 10-year probability of a major osteoporosis-related fracture > 20% based on the  [place]-adapted WHO algorithm 3. Clinician judgment and/or patient preferences may indicate treatment for people with 10-year fracture probabilities above or below these levels FOLLOW-UP: People with diagnosed cases of osteoporosis or osteopenia should be regularly tested for bone mineral density. For patients eligible for Medicare, routine testing is allowed once every 2 years. The testing frequency can be increased to one year for patients who have rapidly progressing disease, or for those who are receiving medical therapy to restore bone mass. I have reviewed this report, and agree with the above findings. [facility] Electronically Signed   By: Zerita Boers M.D.   On: 12/28/2021 09:43   DG Lumbar Spine Complete  Result Date: 12/21/2021 CLINICAL DATA:  Back pain after fall. EXAM: LUMBAR SPINE - COMPLETE 4+ VIEW COMPARISON:  Lumbar spine CT 09/26/2018. FINDINGS: The bones are osteopenic. Posterior fusion rods are seen bilaterally with transpedicular screws at L2, L4, L5 and S1. Alignment appears anatomic. There is no evidence for hardware loosening or fracture. Vertebroplasty changes are again seen at L4. No acute fractures are identified. L1 compression deformity is unchanged. There are severe degenerative changes of the right shoulder. IMPRESSION: 1. Unchanged lumbar fusion hardware which appears uncomplicated. 2. No acute bony abnormality. Electronically Signed   By: Ronney Asters M.D.   On: 12/21/2021 21:53   DG Thoracic Spine 2 View  Result Date: 12/21/2021 CLINICAL DATA:  Fall, back pain. EXAM: THORACIC SPINE 2 VIEWS COMPARISON:  CT chest 11/12/2019 FINDINGS: Vertebroplasty changes are again seen at T9, T10, T11 and T12. Severe compression deformity of L1 is unchanged. Thoracolumbar fusion hardware is partially visualized with pedicle screws at T11, T12 and L1. There is new moderate compression deformity of T5. The bones are diffusely osteopenic. Alignment is anatomic. Sternotomy wires are  present. IMPRESSION: 1. New moderate compression deformity of T5 when compared to 11/12/2019. Correlate clinically for acuity. 2. Otherwise stable vertebroplasty and posterior fusion changes of the lower thoracic spine. Electronically Signed   By: Warren Lacy  Dagoberto Reef M.D.   On: 12/21/2021 21:49   DG Elbow Complete Right  Result Date: 12/15/2021 CLINICAL DATA:  81 year old male status post fall with pain and bruising. EXAM: RIGHT ELBOW - COMPLETE 3+ VIEW COMPARISON:  None Available. FINDINGS: Bone mineralization is within normal limits for age. Along the distal evidence of a posterior soft tissue hematoma right humerus. But no convincing right elbow joint effusion. Joint spaces and alignment are normal for age. Radial head, distal humerus, and proximal ulna appear intact. IMPRESSION: Posterior soft tissue hematoma with no acute fracture or dislocation identified about the right elbow. Electronically Signed   By: Genevie Ann M.D.   On: 12/15/2021 07:23   CT Head Wo Contrast  Result Date: 12/14/2021 CLINICAL DATA:  Trauma fall EXAM: CT HEAD WITHOUT CONTRAST CT CERVICAL SPINE WITHOUT CONTRAST TECHNIQUE: Multidetector CT imaging of the head and cervical spine was performed following the standard protocol without intravenous contrast. Multiplanar CT image reconstructions of the cervical spine were also generated. RADIATION DOSE REDUCTION: This exam was performed according to the departmental dose-optimization program which includes automated exposure control, adjustment of the mA and/or kV according to patient size and/or use of iterative reconstruction technique. COMPARISON:  CT 01/08/2020, 12/05/2017 FINDINGS: CT HEAD FINDINGS Brain: No acute territorial infarction, hemorrhage or intracranial mass. Mild atrophy and chronic small vessel ischemic changes of the white matter. Nonenlarged ventricles Vascular: No hyperdense vessels.  Carotid vascular calcification Skull: No fracture Sinuses/Orbits: No acute finding. Other:  Large right parietal scalp laceration and hematoma CT CERVICAL SPINE FINDINGS Alignment: Alignment within normal limits. No subluxation. Facet alignment is normal Skull base and vertebrae: Cranial vertebral junction appears intact. Chronic mild superior endplate deformities at C7 and T1. No definitive acute fracture is seen. Soft tissues and spinal canal: No prevertebral fluid or swelling. No visible canal hematoma. Disc levels:  Disc spaces appear relatively patent. Upper chest: Mild branching calcifications at the right apex probably postinflammatory. Other: None IMPRESSION: 1. No CT evidence for acute intracranial abnormality. Atrophy and chronic small vessel ischemic changes of the white matter. Large right parietal scalp laceration and hematoma 2. No definite acute osseous abnormality of the cervical spine. Electronically Signed   By: Donavan Foil M.D.   On: 12/14/2021 23:53   CT Cervical Spine Wo Contrast  Result Date: 12/14/2021 CLINICAL DATA:  Trauma fall EXAM: CT HEAD WITHOUT CONTRAST CT CERVICAL SPINE WITHOUT CONTRAST TECHNIQUE: Multidetector CT imaging of the head and cervical spine was performed following the standard protocol without intravenous contrast. Multiplanar CT image reconstructions of the cervical spine were also generated. RADIATION DOSE REDUCTION: This exam was performed according to the departmental dose-optimization program which includes automated exposure control, adjustment of the mA and/or kV according to patient size and/or use of iterative reconstruction technique. COMPARISON:  CT 01/08/2020, 12/05/2017 FINDINGS: CT HEAD FINDINGS Brain: No acute territorial infarction, hemorrhage or intracranial mass. Mild atrophy and chronic small vessel ischemic changes of the white matter. Nonenlarged ventricles Vascular: No hyperdense vessels.  Carotid vascular calcification Skull: No fracture Sinuses/Orbits: No acute finding. Other: Large right parietal scalp laceration and hematoma CT  CERVICAL SPINE FINDINGS Alignment: Alignment within normal limits. No subluxation. Facet alignment is normal Skull base and vertebrae: Cranial vertebral junction appears intact. Chronic mild superior endplate deformities at C7 and T1. No definitive acute fracture is seen. Soft tissues and spinal canal: No prevertebral fluid or swelling. No visible canal hematoma. Disc levels:  Disc spaces appear relatively patent. Upper chest: Mild branching calcifications at the right  apex probably postinflammatory. Other: None IMPRESSION: 1. No CT evidence for acute intracranial abnormality. Atrophy and chronic small vessel ischemic changes of the white matter. Large right parietal scalp laceration and hematoma 2. No definite acute osseous abnormality of the cervical spine. Electronically Signed   By: Donavan Foil M.D.   On: 12/14/2021 23:53    Microbiology: Results for orders placed or performed in visit on 08/20/20  Gastrointestinal Panel by PCR , Stool     Status: None   Collection Time: 08/20/20  3:31 PM   Specimen: Stool  Result Value Ref Range Status   Campylobacter species NOT DETECTED NOT DETECTED Final   Plesimonas shigelloides NOT DETECTED NOT DETECTED Final   Salmonella species NOT DETECTED NOT DETECTED Final   Yersinia enterocolitica NOT DETECTED NOT DETECTED Final   Vibrio species NOT DETECTED NOT DETECTED Final   Vibrio cholerae NOT DETECTED NOT DETECTED Final   Enteroaggregative E coli (EAEC) NOT DETECTED NOT DETECTED Final   Enteropathogenic E coli (EPEC) NOT DETECTED NOT DETECTED Final   Enterotoxigenic E coli (ETEC) NOT DETECTED NOT DETECTED Final   Shiga like toxin producing E coli (STEC) NOT DETECTED NOT DETECTED Final   Shigella/Enteroinvasive E coli (EIEC) NOT DETECTED NOT DETECTED Final   Cryptosporidium NOT DETECTED NOT DETECTED Final   Cyclospora cayetanensis NOT DETECTED NOT DETECTED Final   Entamoeba histolytica NOT DETECTED NOT DETECTED Final   Giardia lamblia NOT DETECTED NOT  DETECTED Final   Adenovirus F40/41 NOT DETECTED NOT DETECTED Final   Astrovirus NOT DETECTED NOT DETECTED Final   Norovirus GI/GII NOT DETECTED NOT DETECTED Final   Rotavirus A NOT DETECTED NOT DETECTED Final   Sapovirus (I, II, IV, and V) NOT DETECTED NOT DETECTED Final    Comment: Performed at [facility], [address] [place], [place] [address]  C difficile quick screen w PCR reflex     Status: None   Collection Time: 08/20/20  3:31 PM   Specimen: STOOL  Result Value Ref Range Status   C Diff antigen NEGATIVE NEGATIVE Final   C Diff toxin NEGATIVE NEGATIVE Final   C Diff interpretation No C. difficile detected.  Final    Comment: Performed at [facility], [address], [place], [place] [address]   *Note: Due to a large number of results and/or encounters for the requested time period, some results have not been displayed. A complete set of results can be found in Results Review.    Labs: CBC: Recent Labs  Lab 01/09/22 1107 01/10/22 1521 01/11/22 0604  WBC 4.3 5.3 4.2  NEUTROABS 3.8 4.7  --   HGB 7.2* 6.3* 6.8*  HCT 23.3* 21.1* 21.3*  MCV 99.1 101.0* 93.4  PLT 114* 105* 643*   Basic Metabolic Panel: Recent Labs  Lab 01/09/22 1107 01/10/22 1521  NA 138 137  K 4.2 4.8  CL 106 105  CO2 25 24  GLUCOSE 138* 162*  BUN 23 29*  CREATININE 0.93 1.18  CALCIUM 8.8* 8.4*   Liver Function Tests: No results for input(s): "AST", "ALT", "ALKPHOS", "BILITOT", "PROT", "ALBUMIN" in the last 168 hours. CBG: No results for input(s): "GLUCAP" in the last 168 hours.  Discharge time spent: greater than 30 minutes.  Signed: Annita Brod, MD [facility] 01/11/2022

## 2022-01-12 ENCOUNTER — Encounter: Payer: Self-pay | Admitting: Family Medicine

## 2022-01-12 ENCOUNTER — Ambulatory Visit: Payer: Medicare (Managed Care) | Admitting: Oncology

## 2022-01-12 ENCOUNTER — Telehealth: Payer: Medicare (Managed Care) | Admitting: Oncology

## 2022-01-12 DIAGNOSIS — S0003XA Contusion of scalp, initial encounter: Secondary | ICD-10-CM

## 2022-01-12 LAB — BPAM RBC
Blood Product Expiration Date: 202309062359
Blood Product Expiration Date: 202309102359
Blood Product Expiration Date: 202309172359
ISSUE DATE / TIME: 202308202033
ISSUE DATE / TIME: 202308211107
Unit Type and Rh: 5100
Unit Type and Rh: 5100
Unit Type and Rh: 9500

## 2022-01-12 LAB — TYPE AND SCREEN
ABO/RH(D): O POS
Antibody Screen: NEGATIVE
Unit division: 0
Unit division: 0
Unit division: 0

## 2022-01-13 ENCOUNTER — Telehealth: Payer: Self-pay

## 2022-01-13 NOTE — Telephone Encounter (Signed)
Transition Care Management Unsuccessful Follow-up Telephone Call  Date of discharge and from where:  01/11/22 [facility]  Attempts:  1st Attempt  Reason for unsuccessful TCM follow-up call:  Unable to reach patient. Will follow.

## 2022-01-14 ENCOUNTER — Telehealth: Payer: Self-pay

## 2022-01-14 ENCOUNTER — Ambulatory Visit: Payer: Medicare (Managed Care) | Admitting: Family

## 2022-01-14 NOTE — Telephone Encounter (Signed)
Patient returned our call.  Patient states he was in the hospital Sunday through Monday (01/10/2022 - 01/11/2022) for bleeding hematoma on his head.  Patient states he was on an antibiotic while in the hospital, but was not prescribed an antibiotic to take at home.  Patient states while changing his dressing today, the way the area looked to him, he believes he should still be on an antibiotic.  Patient states he would like for someone to call him back about this today.  *Patient states his preferred pharmacy is [facility].

## 2022-01-14 NOTE — Telephone Encounter (Signed)
Transition Care Management Unsuccessful Follow-up Telephone Call  Date of discharge and from where:  01/11/22 [facility]  Attempts:  2nd Attempt  Reason for unsuccessful TCM follow-up call:  Left voice message. Will follow.

## 2022-01-15 NOTE — Telephone Encounter (Signed)
Pt called wanting to see if can get medication for his wound. Pt stated they gave him some medication in the hospital

## 2022-01-15 NOTE — Telephone Encounter (Signed)
Transition Care Management Unsuccessful Follow-up Telephone Call  Date of discharge and from where:  01/11/22 [facility]  Attempts:  3rd Attempt  Reason for unsuccessful TCM follow-up call:  Left voice message. Schedule hfu with PCP. No further TCM outreach calls made by [facility] at this time. Closed follow up attempts.

## 2022-01-18 ENCOUNTER — Other Ambulatory Visit: Payer: Self-pay | Admitting: Family

## 2022-01-18 DIAGNOSIS — F1120 Opioid dependence, uncomplicated: Secondary | ICD-10-CM | POA: Diagnosis not present

## 2022-01-18 DIAGNOSIS — M5440 Lumbago with sciatica, unspecified side: Secondary | ICD-10-CM | POA: Diagnosis not present

## 2022-01-18 DIAGNOSIS — M546 Pain in thoracic spine: Secondary | ICD-10-CM | POA: Diagnosis not present

## 2022-01-18 DIAGNOSIS — S81801A Unspecified open wound, right lower leg, initial encounter: Secondary | ICD-10-CM

## 2022-01-20 ENCOUNTER — Other Ambulatory Visit: Payer: Self-pay | Admitting: Family Medicine

## 2022-01-20 DIAGNOSIS — S0003XA Contusion of scalp, initial encounter: Secondary | ICD-10-CM

## 2022-01-28 NOTE — Telephone Encounter (Signed)
Pt called stating he went to wound care and they recommended a skin graft. Pt has a consultation at [facility] on October 4 and pt want to know if can he get a referral sent to [facility] to see if he can get in earlier there

## 2022-01-29 ENCOUNTER — Ambulatory Visit: Payer: Medicare (Managed Care) | Admitting: Physician Assistant

## 2022-01-31 NOTE — Telephone Encounter (Signed)
Inform sister Irene Limbo 2 referrals placed [facility] in [place] and [facility] in [place] to see if someone can see him sooner than [employer] his appt with them is 02/24/22 and they want sooner appt   Rasheedah referrals under note from Dr. Chauncey Cruel from 12/31/21   Thank you

## 2022-01-31 NOTE — Addendum Note (Signed)
Addended by: Orland Mustard on: 01/31/2022 09:53 PM   Modules accepted: Orders

## 2022-02-01 ENCOUNTER — Ambulatory Visit: Payer: Medicare (Managed Care) | Admitting: Family Medicine

## 2022-02-02 ENCOUNTER — Encounter: Payer: Self-pay | Admitting: Plastic Surgery

## 2022-02-02 ENCOUNTER — Ambulatory Visit (INDEPENDENT_AMBULATORY_CARE_PROVIDER_SITE_OTHER): Payer: Medicare (Managed Care) | Admitting: Plastic Surgery

## 2022-02-02 VITALS — BP 142/69 | HR 60 | Ht 71.0 in | Wt 156.0 lb

## 2022-02-02 DIAGNOSIS — W19XXXA Unspecified fall, initial encounter: Secondary | ICD-10-CM | POA: Diagnosis not present

## 2022-02-02 DIAGNOSIS — N183 Chronic kidney disease, stage 3 unspecified: Secondary | ICD-10-CM

## 2022-02-02 DIAGNOSIS — S0100XA Unspecified open wound of scalp, initial encounter: Secondary | ICD-10-CM | POA: Diagnosis not present

## 2022-02-02 DIAGNOSIS — T148XXA Other injury of unspecified body region, initial encounter: Secondary | ICD-10-CM

## 2022-02-02 DIAGNOSIS — L989 Disorder of the skin and subcutaneous tissue, unspecified: Secondary | ICD-10-CM

## 2022-02-02 DIAGNOSIS — I482 Chronic atrial fibrillation, unspecified: Secondary | ICD-10-CM

## 2022-02-02 NOTE — Progress Notes (Signed)
Patient ID: Bryan [name], male    DOB: Aug 02, 1940, 81 y.o.   MRN: [id number]   Chief Complaint  Patient presents with   Advice Only   Skin Problem    The patient is an 81 year old male here with a friend for evaluation of his scalp.  5 weeks ago fell and sustained a hematoma to his left scalp and ear.  The hematoma created some pressure which caused skin breakdown on the scalp area.  He has done really well and healed all but about 2 x 2 x 0.2 cm on the right scalp.  It does not look infected.  It has granulation tissue at the base.  He has multiple medical conditions and is on multiple medications.  These are listed below.    Review of Systems  Constitutional: Negative.   Eyes: Negative.   Respiratory: Negative.  Negative for chest tightness.   Cardiovascular: Negative.   Endocrine: Negative.     Past Medical History:  Diagnosis Date   Abnormal CT scan    Asymmetric left rectal wall thickening    Anemia    Anxiety    Arrhythmia    Basal cell carcinoma 04/28/2020   L forehead - ED&C 06/17/2020   Basal cell carcinoma 05/06/2021   left scalp postauricular ant, EDC   Basal cell carcinoma 05/06/2021   left scalp postauricular post, EDC   CHF (congestive heart failure) ([place])    Chronic lower back pain    Complication of anesthesia    Memory loss 09/2015   Coronary artery disease    Depression    GERD (gastroesophageal reflux disease)    Glaucoma    H/O thoracic aortic aneurysm repair    Headache    Heart murmur    History of being hospitalized    memory lose kidney funtion down blood pressure up   History of blood transfusion    "think he had one when he had heart valve OR" (10/14/2016); "none since" (10/19/2017)   History of chicken pox    Hyperlipidemia    Hypertension    Lumbar stenosis    Murmur    Neuropathy    Osteoarthritis    worse in feet and ankles   Paroxysmal A-fib ([place])    Poor short term memory    takes Aricept   Rheumatoid arthritis ([place])     "all over" (10/14/2016)   Schizophrenia ([place])    Seasonal allergies    Thoracic spine pain    Valvular heart disease    Wears glasses    reading    Past Surgical History:  Procedure Laterality Date   AORTIC VALVE REPLACEMENT  2007   [facility].  Supply, [place]; "pig valve"   APPENDECTOMY  2010   BACK SURGERY     CARDIAC VALVE REPLACEMENT     CATARACT EXTRACTION W/ INTRAOCULAR LENS  IMPLANT, BILATERAL Bilateral 2012   COLONOSCOPY WITH PROPOFOL N/A 09/24/2016   Procedure: COLONOSCOPY WITH PROPOFOL;  Surgeon: Jonathon Bellows, MD;  Location: [facility] ENDOSCOPY;  Service: Endoscopy;  Laterality: N/A;   ESOPHAGOGASTRODUODENOSCOPY (EGD) WITH PROPOFOL N/A 09/24/2016   Procedure: ESOPHAGOGASTRODUODENOSCOPY (EGD) WITH PROPOFOL;  Surgeon: Jonathon Bellows, MD;  Location: [facility] ENDOSCOPY;  Service: Endoscopy;  Laterality: N/A;   GIVENS CAPSULE STUDY N/A 09/24/2016   Procedure: GIVENS CAPSULE STUDY;  Surgeon: Jonathon Bellows, MD;  Location: [facility] ENDOSCOPY;  Service: Endoscopy;  Laterality: N/A;   HAMMER TOE SURGERY Right 10/09/2015   Procedure: HAMMER TOE REPAIR WITH K-WIRE FIXATION  RIGHT SECOND TOE;  Surgeon: Albertine Patricia, DPM;  Location: [place];  Service: Podiatry;  Laterality: Right;  WITH LOCAL   HARDWARE REMOVAL N/A 11/02/2019   Procedure: Removal of hardware thoracic spine;  Surgeon: Kary Kos, MD;  Location: [place];  Service: Neurosurgery;  Laterality: N/A;  posterior   INGUINAL HERNIA REPAIR Right 05/05/2018   Medium Bard PerFix plug.  Surgeon: Robert Bellow, MD;  Location: [facility] ORS;  Service: General;  Laterality: Right;   IR VERTEBROPLASTY CERV/THOR BX INC UNI/BIL INC/INJECT/IMAGING  10/27/2018   JOINT REPLACEMENT Left    left total knee   LAMINECTOMY WITH POSTERIOR LATERAL ARTHRODESIS LEVEL 3 N/A 09/28/2017   Procedure: LUMBAR  POSTEROR  FUSION REVISION - LUMBAR ONE -TWO, LUMBAR TWO -THREE,THREE-FOUR ,BILATERAL ARTHRODESIS REMOVAL LUMBAR THREE HARDWARE;  Surgeon: Kary Kos, MD;   Location: [place];  Service: Neurosurgery;  Laterality: N/A;   LAMINECTOMY WITH POSTERIOR LATERAL ARTHRODESIS LEVEL 3 N/A 10/20/2017   Procedure: Revision fusion and removal of hardware Lumbar one, Pedicle screw fixation thoracic ten-lumbar two, thoracic nine-ten laminotomy, Posterior Lumbar Arthrodesis thoracic ten-lumbar two ;  Surgeon: Kary Kos, MD;  Location: [place];  Service: Neurosurgery;  Laterality: N/A;   LAPAROSCOPIC CHOLECYSTECTOMY  2010   LUMBAR FUSION Right 06/08/2017   LUMBAR THREE-FOUR, LUMBAR FOUR-FIVE POSTEROLATERAL ARTHRODESIS WITH RIGHT LUMBAR FOUR-FIVE LAMINECTOMY/FORAMINOTOMY   LUMBAR LAMINECTOMY/DECOMPRESSION MICRODISCECTOMY Right 03/08/2016   Procedure: Laminectomy and Foraminotomy - Lumbar Five-Sacral One Right;  Surgeon: Kary Kos, MD;  Location: [place];  Service: Neurosurgery;  Laterality: Right;  Right   MULTIPLE TOOTH EXTRACTIONS     "3"   OTOPLASATY Right 12/15/2021   Procedure: Right auricle repair;  Surgeon: Margaretha Sheffield, MD;  Location: [facility] ORS;  Service: ENT;  Laterality: Right;   POSTERIOR LUMBAR FUSION  10/14/2016   L5-S1   REPLACEMENT TOTAL KNEE Left 2003   THORACIC AORTIC ANEURYSM REPAIR  2010   TOTAL KNEE ARTHROPLASTY Right 08/2021      Current Outpatient Medications:    amLODipine (NORVASC) 5 MG tablet, TAKE 1 TABLET BY MOUTH DAILY., Disp: 90 tablet, Rfl: 1   atorvastatin (LIPITOR) 40 MG tablet, TAKE 1 TABLET BY MOUTH DAILY, Disp: 90 tablet, Rfl: 3   calcium carbonate (OS-CAL - DOSED IN MG OF ELEMENTAL CALCIUM) 1250 (500 Ca) MG tablet, Take 1 tablet by mouth daily with lunch., Disp: , Rfl:    donepezil (ARICEPT) 10 MG tablet, Take 1 tablet by mouth at bedtime., Disp: , Rfl:    fenofibrate 160 MG tablet, TAKE ONE TABLET AT BEDTIME, Disp: 90 tablet, Rfl: 3   ferrous sulfate 325 (65 FE) MG tablet, Take by mouth., Disp: , Rfl:    FLUoxetine (PROZAC) 20 MG capsule, TAKE 1 CAPSULE BY MOUTH ONCE DAILY, Disp: 90 capsule, Rfl: 1   gabapentin (NEURONTIN) 300 MG  capsule, TAKE 1 CAPSULE BY MOUTH 3 TIMES DAILY., Disp: 270 capsule, Rfl: 0   HYDROcodone-acetaminophen (NORCO) 10-325 MG tablet, Take 1 tablet by mouth 4 (four) times daily as needed for moderate pain., Disp: , Rfl:    hydrocortisone cream 1 %, Apply 1 application topically daily as needed for itching., Disp: , Rfl:    ipratropium (ATROVENT) 0.06 % nasal spray, Place 2 sprays into both nostrils 4 (four) times daily., Disp: 15 mL, Rfl: 12   lisinopril (ZESTRIL) 20 MG tablet, Take 30 mg by mouth daily., Disp: , Rfl:    loperamide (IMODIUM) 2 MG capsule, Take 4 mg by mouth in the morning and at bedtime.,  Disp: , Rfl:    loratadine (CLARITIN) 10 MG tablet, Take 10 mg by mouth daily., Disp: , Rfl:    Multiple Vitamins-Minerals (PRESERVISION AREDS 2) CAPS, Take 1 capsule by mouth daily., Disp: , Rfl:    omeprazole (PRILOSEC) 40 MG capsule, TAKE 1 CAPSULE EVERY DAY, Disp: 90 capsule, Rfl: 3   vitamin B-12 (CYANOCOBALAMIN) 1000 MCG tablet, Take 1,000 mcg by mouth daily with lunch. , Disp: , Rfl:    Vitamin D, Ergocalciferol, (DRISDOL) 1.25 MG (50000 UNIT) CAPS capsule, Take 1 capsule by mouth on Monday and Thursday., Disp: 16 capsule, Rfl: 1   vitamin E 180 MG (400 UNITS) capsule, Take 800 Units by mouth daily with lunch., Disp: , Rfl:  No current facility-administered medications for this visit.  Facility-Administered Medications Ordered in Other Visits:    0.9 %  sodium chloride infusion, , Intravenous, Continuous, Corcoran, Melissa C, MD, Last Rate: 10 mL/hr at 12/11/19 1123, New Bag at 12/11/19 1123   Objective:   Vitals:   02/02/22 1124  BP: (!) 142/69  Pulse: 60  SpO2: 98%    Physical Exam Vitals reviewed.  HENT:     Head: Normocephalic.  Cardiovascular:     Pulses: Normal pulses.  Pulmonary:     Effort: Pulmonary effort is normal.  Skin:    Capillary Refill: Capillary refill takes less than 2 seconds.  Neurological:     Mental Status: He is alert and oriented to person, place, and  time.  Psychiatric:        Mood and Affect: Mood normal.        Behavior: Behavior normal.        Thought Content: Thought content normal.        Judgment: Judgment normal.     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  He can wash his hair and I recommend Johnson's baby shampoo.  I also recommend increasing his protein and sugar intake.  I would like to see him back in 2 to 3 weeks.  Lorenzo, DO

## 2022-02-03 DIAGNOSIS — S0190XA Unspecified open wound of unspecified part of head, initial encounter: Secondary | ICD-10-CM | POA: Diagnosis not present

## 2022-02-09 ENCOUNTER — Telehealth: Payer: Self-pay | Admitting: Family Medicine

## 2022-02-09 ENCOUNTER — Other Ambulatory Visit (INDEPENDENT_AMBULATORY_CARE_PROVIDER_SITE_OTHER): Payer: Medicare (Managed Care)

## 2022-02-09 ENCOUNTER — Telehealth: Payer: Self-pay

## 2022-02-09 DIAGNOSIS — S0003XA Contusion of scalp, initial encounter: Secondary | ICD-10-CM

## 2022-02-09 DIAGNOSIS — E559 Vitamin D deficiency, unspecified: Secondary | ICD-10-CM

## 2022-02-09 MED ORDER — STERILE WATER FOR IRRIGATION IR SOLN: 1000.0000 mL | Freq: Once | 0 refills | Status: AC

## 2022-02-09 NOTE — Telephone Encounter (Signed)
I ordered the steril water for the patient for irrigation of his wound and sent it to his pharmacy.  Vitor Overbaugh,cma

## 2022-02-09 NOTE — Telephone Encounter (Signed)
Sterile water for irrigation for hematoma on his head . He received a bottle in the ED he is running out. Send in prescription to [facility]. The sterile water is prescription only according to the pharmacist.

## 2022-02-09 NOTE — Telephone Encounter (Signed)
Faxed wound supply order to [facility] with demographics, insurance card and ov note; received confirmation success; forwarded order to front desk x batch scanning.

## 2022-02-10 ENCOUNTER — Encounter: Payer: Self-pay | Admitting: Hematology and Oncology

## 2022-02-10 ENCOUNTER — Other Ambulatory Visit: Payer: Self-pay | Admitting: Family Medicine

## 2022-02-10 DIAGNOSIS — E559 Vitamin D deficiency, unspecified: Secondary | ICD-10-CM

## 2022-02-10 LAB — VITAMIN D 25 HYDROXY (VIT D DEFICIENCY, FRACTURES): VITD: 28.43 ng/mL — ABNORMAL LOW (ref 30.00–100.00)

## 2022-02-10 MED ORDER — VITAMIN D (ERGOCALCIFEROL) 1.25 MG (50000 UNIT) PO CAPS: ORAL_CAPSULE | ORAL | 1 refills | Status: DC

## 2022-02-22 ENCOUNTER — Ambulatory Visit (INDEPENDENT_AMBULATORY_CARE_PROVIDER_SITE_OTHER): Payer: Medicare (Managed Care)

## 2022-02-22 VITALS — Ht 71.0 in | Wt 156.0 lb

## 2022-02-22 DIAGNOSIS — Z0000 Encounter for general adult medical examination without abnormal findings: Secondary | ICD-10-CM

## 2022-02-22 NOTE — Patient Instructions (Addendum)
[name] , Thank you for taking time to come for your Medicare Wellness Visit. I appreciate your ongoing commitment to your health goals. Please review the following plan we discussed and let me know if I can assist you in the future.   These are the goals we discussed:  Goals       Patient Stated     Increase physical activity (pt-stated)      Stay active walking for exercise        This is a list of the screening recommended for you and due dates:  Health Maintenance  Topic Date Due   COVID-19 Vaccine (4 - Pfizer risk series) 03/10/2022*   Colon Cancer Screening  03/25/2022*   Zoster (Shingles) Vaccine (1 of 2) 04/23/2022*   Flu Shot  08/22/2022*   Tetanus Vaccine  12/14/2023   Pneumonia Vaccine  Completed   HPV Vaccine  Aged Out  *Topic was postponed. The date shown is not the original due date.    Advanced directives: on file  Conditions/risks identified: none new  Next appointment: Follow up in one year for your annual wellness visit.   Preventive Care 3 Years and Older, Male  Preventive care refers to lifestyle choices and visits with your health care provider that can promote health and wellness. What does preventive care include? A yearly physical exam. This is also called an annual well check. Dental exams once or twice a year. Routine eye exams. Ask your health care provider how often you should have your eyes checked. Personal lifestyle choices, including: Daily care of your teeth and gums. Regular physical activity. Eating a healthy diet. Avoiding tobacco and drug use. Limiting alcohol use. Practicing safe sex. Taking low doses of aspirin every day. Taking vitamin and mineral supplements as recommended by your health care provider. What happens during an annual well check? The services and screenings done by your health care provider during your annual well check will depend on your age, overall health, lifestyle risk factors, and family history of  disease. Counseling  Your health care provider may ask you questions about your: Alcohol use. Tobacco use. Drug use. Emotional well-being. Home and relationship well-being. Sexual activity. Eating habits. History of falls. Memory and ability to understand (cognition). Work and work Statistician. Screening  You may have the following tests or measurements: Height, weight, and BMI. Blood pressure. Lipid and cholesterol levels. These may be checked every 5 years, or more frequently if you are over 34 years old. Skin check. Lung cancer screening. You may have this screening every year starting at age 69 if you have a 30-pack-year history of smoking and currently smoke or have quit within the past 15 years. Fecal occult blood test (FOBT) of the stool. You may have this test every year starting at age 57. Flexible sigmoidoscopy or colonoscopy. You may have a sigmoidoscopy every 5 years or a colonoscopy every 10 years starting at age 42. Prostate cancer screening. Recommendations will vary depending on your family history and other risks. Hepatitis C blood test. Hepatitis B blood test. Sexually transmitted disease (STD) testing. Diabetes screening. This is done by checking your blood sugar (glucose) after you have not eaten for a while (fasting). You may have this done every 1-3 years. Abdominal aortic aneurysm (AAA) screening. You may need this if you are a current or former smoker. Osteoporosis. You may be screened starting at age 74 if you are at high risk. Talk with your health care provider about your test  results, treatment options, and if necessary, the need for more tests. Vaccines  Your health care provider may recommend certain vaccines, such as: Influenza vaccine. This is recommended every year. Tetanus, diphtheria, and acellular pertussis (Tdap, Td) vaccine. You may need a Td booster every 10 years. Zoster vaccine. You may need this after age 69. Pneumococcal 13-valent  conjugate (PCV13) vaccine. One dose is recommended after age 35. Pneumococcal polysaccharide (PPSV23) vaccine. One dose is recommended after age 70. Talk to your health care provider about which screenings and vaccines you need and how often you need them. This information is not intended to replace advice given to you by your health care provider. Make sure you discuss any questions you have with your health care provider. Document Released: 06/06/2015 Document Revised: 01/28/2016 Document Reviewed: 03/11/2015 Elsevier Interactive Patient Education  2017 [place] Prevention in the Home Falls can cause injuries. They can happen to people of all ages. There are many things you can do to make your home safe and to help prevent falls. What can I do on the outside of my home? Regularly fix the edges of walkways and driveways and fix any cracks. Remove anything that might make you trip as you walk through a door, such as a raised step or threshold. Trim any bushes or trees on the path to your home. Use bright outdoor lighting. Clear any walking paths of anything that might make someone trip, such as rocks or tools. Regularly check to see if handrails are loose or broken. Make sure that both sides of any steps have handrails. Any raised decks and porches should have guardrails on the edges. Have any leaves, snow, or ice cleared regularly. Use sand or salt on walking paths during winter. Clean up any spills in your garage right away. This includes oil or grease spills. What can I do in the bathroom? Use night lights. Install grab bars by the toilet and in the tub and shower. Do not use towel bars as grab bars. Use non-skid mats or decals in the tub or shower. If you need to sit down in the shower, use a plastic, non-slip stool. Keep the floor dry. Clean up any water that spills on the floor as soon as it happens. Remove soap buildup in the tub or shower regularly. Attach bath mats  securely with double-sided non-slip rug tape. Do not have throw rugs and other things on the floor that can make you trip. What can I do in the bedroom? Use night lights. Make sure that you have a light by your bed that is easy to reach. Do not use any sheets or blankets that are too big for your bed. They should not hang down onto the floor. Have a firm chair that has side arms. You can use this for support while you get dressed. Do not have throw rugs and other things on the floor that can make you trip. What can I do in the kitchen? Clean up any spills right away. Avoid walking on wet floors. Keep items that you use a lot in easy-to-reach places. If you need to reach something above you, use a strong step stool that has a grab bar. Keep electrical cords out of the way. Do not use floor polish or wax that makes floors slippery. If you must use wax, use non-skid floor wax. Do not have throw rugs and other things on the floor that can make you trip. What can I do with my stairs?  Do not leave any items on the stairs. Make sure that there are handrails on both sides of the stairs and use them. Fix handrails that are broken or loose. Make sure that handrails are as long as the stairways. Check any carpeting to make sure that it is firmly attached to the stairs. Fix any carpet that is loose or worn. Avoid having throw rugs at the top or bottom of the stairs. If you do have throw rugs, attach them to the floor with carpet tape. Make sure that you have a light switch at the top of the stairs and the bottom of the stairs. If you do not have them, ask someone to add them for you. What else can I do to help prevent falls? Wear shoes that: Do not have high heels. Have rubber bottoms. Are comfortable and fit you well. Are closed at the toe. Do not wear sandals. If you use a stepladder: Make sure that it is fully opened. Do not climb a closed stepladder. Make sure that both sides of the stepladder  are locked into place. Ask someone to hold it for you, if possible. Clearly mark and make sure that you can see: Any grab bars or handrails. First and last steps. Where the edge of each step is. Use tools that help you move around (mobility aids) if they are needed. These include: Canes. Walkers. Scooters. Crutches. Turn on the lights when you go into a dark area. Replace any light bulbs as soon as they burn out. Set up your furniture so you have a clear path. Avoid moving your furniture around. If any of your floors are uneven, fix them. If there are any pets around you, be aware of where they are. Review your medicines with your doctor. Some medicines can make you feel dizzy. This can increase your chance of falling. Ask your doctor what other things that you can do to help prevent falls. This information is not intended to replace advice given to you by your health care provider. Make sure you discuss any questions you have with your health care provider. Document Released: 03/06/2009 Document Revised: 10/16/2015 Document Reviewed: 06/14/2014 Elsevier Interactive Patient Education  2017 [place].  Opioid Pain Medicine Management Opioids are powerful medicines that are used to treat moderate to severe pain. When used for short periods of time, they can help you to: Sleep better. Do better in physical or occupational therapy. Feel better in the first few days after an injury. Recover from surgery. Opioids should be taken with the supervision of a trained health care provider. They should be taken for the shortest period of time possible. This is because opioids can be addictive, and the longer you take opioids, the greater your risk of addiction. This addiction can also be called opioid use disorder. What are the risks? Using opioid pain medicines for longer than 3 days increases your risk of side effects. Side effects include: Constipation. Nausea and vomiting. Breathing  difficulties (respiratory depression). Drowsiness. Confusion. Opioid use disorder. Itching. Taking opioid pain medicine for a long period of time can affect your ability to do daily tasks. It also puts you at risk for: Motor vehicle crashes. Depression. Suicide. Heart attack. Overdose, which can be life-threatening. What is a pain treatment plan? A pain treatment plan is an agreement between you and your health care provider. Pain is unique to each person, and treatments vary depending on your condition. To manage your pain, you and your health care provider need  to work together. To help you do this: Discuss the goals of your treatment, including how much pain you might expect to have and how you will manage the pain. Review the risks and benefits of taking opioid medicines. Remember that a good treatment plan uses more than one approach and minimizes the chance of side effects. Be honest about the amount of medicines you take and about any drug or alcohol use. Get pain medicine prescriptions from only one health care provider. Pain can be managed with many types of alternative treatments. Ask your health care provider to refer you to one or more specialists who can help you manage pain through: Physical or occupational therapy. Counseling (cognitive behavioral therapy). Good nutrition. Biofeedback. Massage. Meditation. Non-opioid medicine. Following a gentle exercise program. How to use opioid pain medicine Taking medicine Take your pain medicine exactly as told by your health care provider. Take it only when you need it. If your pain gets less severe, you may take less than your prescribed dose if your health care provider approves. If you are not having pain, do nottake pain medicine unless your health care provider tells you to take it. If your pain is severe, do nottry to treat it yourself by taking more pills than instructed on your prescription. Contact your health care  provider for help. Write down the times when you take your pain medicine. It is easy to become confused while on pain medicine. Writing the time can help you avoid overdose. Take other over-the-counter or prescription medicines only as told by your health care provider. Keeping yourself and others safe  While you are taking opioid pain medicine: Do not drive, use machinery, or power tools. Do not sign legal documents. Do not drink alcohol. Do not take sleeping pills. Do not supervise children by yourself. Do not do activities that require climbing or being in high places. Do not go to a lake, river, ocean, spa, or swimming pool. Do not share your pain medicine with anyone. Keep pain medicine in a locked cabinet or in a secure area where pets and children cannot reach it. Stopping your use of opioids If you have been taking opioid medicine for more than a few weeks, you may need to slowly decrease (taper) how much you take until you stop completely. Tapering your use of opioids can decrease your risk of symptoms of withdrawal, such as: Pain and cramping in the abdomen. Nausea. Sweating. Sleepiness. Restlessness. Uncontrollable shaking (tremors). Cravings for the medicine. Do not attempt to taper your use of opioids on your own. Talk with your health care provider about how to do this. Your health care provider may prescribe a step-down schedule based on how much medicine you are taking and how long you have been taking it. Getting rid of leftover pills Do not save any leftover pills. Get rid of leftover pills safely by: Taking the medicine to a prescription take-back program. This is usually offered by the county or law enforcement. Bringing them to a pharmacy that has a drug disposal container. Flushing them down the toilet. Check the label or package insert of your medicine to see whether this is safe to do. Throwing them out in the trash. Check the label or package insert of your  medicine to see whether this is safe to do. If it is safe to throw it out, remove the medicine from the original container, put it into a sealable bag or container, and mix it with used coffee grounds, food scraps,  dirt, or cat litter before putting it in the trash. Follow these instructions at home: Activity Do exercises as told by your health care provider. Avoid activities that make your pain worse. Return to your normal activities as told by your health care provider. Ask your health care provider what activities are safe for you. General instructions You may need to take these actions to prevent or treat constipation: Drink enough fluid to keep your urine pale yellow. Take over-the-counter or prescription medicines. Eat foods that are high in fiber, such as beans, whole grains, and fresh fruits and vegetables. Limit foods that are high in fat and processed sugars, such as fried or sweet foods. Keep all follow-up visits. This is important. Where to find support If you have been taking opioids for a long time, you may benefit from receiving support for quitting from a local support group or counselor. Ask your health care provider for a referral to these resources in your area. Where to find more information Centers for Disease Control and Prevention (CDC): http://www.wolf.info/ [place] Food and Drug Administration (FDA): [employer] Get help right away if: You may have taken too much of an opioid (overdosed). Common symptoms of an overdose: Your breathing is slower or more shallow than normal. You have a very slow heartbeat (pulse). You have slurred speech. You have nausea and vomiting. Your pupils become very small. You have other potential symptoms: You are very confused. You faint or feel like you will faint. You have cold, clammy skin. You have blue lips or fingernails. You have thoughts of harming yourself or harming others. These symptoms may represent a serious problem that is an  emergency. Do not wait to see if the symptoms will go away. Get medical help right away. Call your local emergency services (911 in the [place] Do not drive yourself to the hospital.  If you ever feel like you may hurt yourself or others, or have thoughts about taking your own life, get help right away. Go to your nearest emergency department or: Call your local emergency services (911 in the [place] Call the [facility] 980-738-0746 in the [place] Call a suicide crisis helpline, such as the [place] at 765-698-7816 or 988 in the [place]. This is open 24 hours a day in the [place] Text the Crisis Text Line at (830)819-8741 (in the [employer] Summary Opioid medicines can help you manage moderate to severe pain for a short period of time. A pain treatment plan is an agreement between you and your health care provider. Discuss the goals of your treatment, including how much pain you might expect to have and how you will manage the pain. If you think that you or someone else may have taken too much of an opioid, get medical help right away. This information is not intended to replace advice given to you by your health care provider. Make sure you discuss any questions you have with your health care provider. Document Revised: 12/03/2020 Document Reviewed: 08/20/2020 Elsevier Patient Education  [place].

## 2022-02-22 NOTE — Progress Notes (Signed)
Subjective:   Bryan [name] is a 81 y.o. male who presents for Medicare Annual/Subsequent preventive examination.  Review of Systems    No ROS.  Medicare Wellness Virtual Visit.  Visual/audio telehealth visit, UTA vital signs.   See social history for additional risk factors.   Cardiac Risk Factors include: advanced age (>53mn, >>79women);male gender     Objective:    Today's Vitals   02/22/22 1306  Weight: 156 lb (70.8 kg)  Height: '5\' 11"'$  (1.803 m)   Body mass index is 21.76 kg/m.     02/22/2022    1:14 PM 01/10/2022    6:11 PM 01/09/2022   10:43 AM 01/03/2022    3:22 PM 12/14/2021   10:44 PM 11/20/2021   11:00 AM 11/06/2021    1:26 PM  Advanced Directives  Does Patient Have a Medical Advance Directive? Yes Yes No No No Yes Yes  Type of AParamedicof AWedoweeLiving will H[place]ta[place]Living will     HMorov[place]Living will  Does patient want to make changes to medical advance directive? No - Patient declined No - Patient declined    No - Patient declined   Copy of HDeer Riverin Chart? Yes - validated most recent copy scanned in chart (See row information) No - copy requested       Would patient like information on creating a medical advance directive?  No - Patient declined     No - Patient declined    Current Medications (verified) Outpatient Encounter Medications as of 02/22/2022  Medication Sig   amLODipine (NORVASC) 5 MG tablet TAKE 1 TABLET BY MOUTH DAILY.   atorvastatin (LIPITOR) 40 MG tablet TAKE 1 TABLET BY MOUTH DAILY   calcium carbonate (OS-CAL - DOSED IN MG OF ELEMENTAL CALCIUM) 1250 (500 Ca) MG tablet Take 1 tablet by mouth daily with lunch.   donepezil (ARICEPT) 10 MG tablet Take 1 tablet by mouth at bedtime.   fenofibrate 160 MG tablet TAKE ONE TABLET AT BEDTIME   ferrous sulfate 325 (65 FE) MG tablet Take by mouth.   FLUoxetine (PROZAC) 20 MG capsule TAKE 1 CAPSULE BY MOUTH  ONCE DAILY   gabapentin (NEURONTIN) 300 MG capsule TAKE 1 CAPSULE BY MOUTH 3 TIMES DAILY.   HYDROcodone-acetaminophen (NORCO) 10-325 MG tablet Take 1 tablet by mouth 4 (four) times daily as needed for moderate pain.   hydrocortisone cream 1 % Apply 1 application topically daily as needed for itching.   ipratropium (ATROVENT) 0.06 % nasal spray Place 2 sprays into both nostrils 4 (four) times daily.   lisinopril (ZESTRIL) 20 MG tablet Take 30 mg by mouth daily.   loperamide (IMODIUM) 2 MG capsule Take 4 mg by mouth in the morning and at bedtime.   loratadine (CLARITIN) 10 MG tablet Take 10 mg by mouth daily.   Multiple Vitamins-Minerals (PRESERVISION AREDS 2) CAPS Take 1 capsule by mouth daily.   omeprazole (PRILOSEC) 40 MG capsule TAKE 1 CAPSULE EVERY DAY   vitamin B-12 (CYANOCOBALAMIN) 1000 MCG tablet Take 1,000 mcg by mouth daily with lunch.    Vitamin D, Ergocalciferol, (DRISDOL) 1.25 MG (50000 UNIT) CAPS capsule Take 1 capsule by mouth on Monday and Thursday.   vitamin E 180 MG (400 UNITS) capsule Take 800 Units by mouth daily with lunch.   Facility-Administered Encounter Medications as of 02/22/2022  Medication   0.9 %  sodium chloride infusion    Allergies (verified) Penicillins, Demerol [meperidine], and  Other   History: Past Medical History:  Diagnosis Date   Abnormal CT scan    Asymmetric left rectal wall thickening    Anemia    Anxiety    Arrhythmia    Basal cell carcinoma 04/28/2020   L forehead - ED&C 06/17/2020   Basal cell carcinoma 05/06/2021   left scalp postauricular ant, EDC   Basal cell carcinoma 05/06/2021   left scalp postauricular post, EDC   CHF (congestive heart failure) ([place])    Chronic lower back pain    Complication of anesthesia    Memory loss 09/2015   Coronary artery disease    Depression    GERD (gastroesophageal reflux disease)    Glaucoma    H/O thoracic aortic aneurysm repair    Headache    Heart murmur    History of being hospitalized     memory lose kidney funtion down blood pressure up   History of blood transfusion    "think he had one when he had heart valve OR" (10/14/2016); "none since" (10/19/2017)   History of chicken pox    Hyperlipidemia    Hypertension    Lumbar stenosis    Murmur    Neuropathy    Osteoarthritis    worse in feet and ankles   Paroxysmal A-fib ([place])    Poor short term memory    takes Aricept   Rheumatoid arthritis ([place])    "all over" (10/14/2016)   Schizophrenia ([place])    Seasonal allergies    Thoracic spine pain    Valvular heart disease    Wears glasses    reading   Past Surgical History:  Procedure Laterality Date   AORTIC VALVE REPLACEMENT  2007   [facility].  Supply, [place]; "pig valve"   APPENDECTOMY  2010   BACK SURGERY     CARDIAC VALVE REPLACEMENT     CATARACT EXTRACTION W/ INTRAOCULAR LENS  IMPLANT, BILATERAL Bilateral 2012   COLONOSCOPY WITH PROPOFOL N/A 09/24/2016   Procedure: COLONOSCOPY WITH PROPOFOL;  Surgeon: Jonathon Bellows, MD;  Location: [facility] ENDOSCOPY;  Service: Endoscopy;  Laterality: N/A;   ESOPHAGOGASTRODUODENOSCOPY (EGD) WITH PROPOFOL N/A 09/24/2016   Procedure: ESOPHAGOGASTRODUODENOSCOPY (EGD) WITH PROPOFOL;  Surgeon: Jonathon Bellows, MD;  Location: [facility] ENDOSCOPY;  Service: Endoscopy;  Laterality: N/A;   GIVENS CAPSULE STUDY N/A 09/24/2016   Procedure: GIVENS CAPSULE STUDY;  Surgeon: Jonathon Bellows, MD;  Location: [facility] ENDOSCOPY;  Service: Endoscopy;  Laterality: N/A;   HAMMER TOE SURGERY Right 10/09/2015   Procedure: HAMMER TOE REPAIR WITH K-WIRE FIXATION RIGHT SECOND TOE;  Surgeon: Albertine Patricia, DPM;  Location: [place];  Service: Podiatry;  Laterality: Right;  WITH LOCAL   HARDWARE REMOVAL N/A 11/02/2019   Procedure: Removal of hardware thoracic spine;  Surgeon: Kary Kos, MD;  Location: [place];  Service: Neurosurgery;  Laterality: N/A;  posterior   INGUINAL HERNIA REPAIR Right 05/05/2018   Medium Bard PerFix plug.  Surgeon: Robert Bellow, MD;   Location: [facility] ORS;  Service: General;  Laterality: Right;   IR VERTEBROPLASTY CERV/THOR BX INC UNI/BIL INC/INJECT/IMAGING  10/27/2018   JOINT REPLACEMENT Left    left total knee   LAMINECTOMY WITH POSTERIOR LATERAL ARTHRODESIS LEVEL 3 N/A 09/28/2017   Procedure: LUMBAR  POSTEROR  FUSION REVISION - LUMBAR ONE -TWO, LUMBAR TWO -THREE,THREE-FOUR ,BILATERAL ARTHRODESIS REMOVAL LUMBAR THREE HARDWARE;  Surgeon: Kary Kos, MD;  Location: [place];  Service: Neurosurgery;  Laterality: N/A;   LAMINECTOMY WITH POSTERIOR LATERAL ARTHRODESIS LEVEL 3 N/A 10/20/2017  Procedure: Revision fusion and removal of hardware Lumbar one, Pedicle screw fixation thoracic ten-lumbar two, thoracic nine-ten laminotomy, Posterior Lumbar Arthrodesis thoracic ten-lumbar two ;  Surgeon: Kary Kos, MD;  Location: [place];  Service: Neurosurgery;  Laterality: N/A;   LAPAROSCOPIC CHOLECYSTECTOMY  2010   LUMBAR FUSION Right 06/08/2017   LUMBAR THREE-FOUR, LUMBAR FOUR-FIVE POSTEROLATERAL ARTHRODESIS WITH RIGHT LUMBAR FOUR-FIVE LAMINECTOMY/FORAMINOTOMY   LUMBAR LAMINECTOMY/DECOMPRESSION MICRODISCECTOMY Right 03/08/2016   Procedure: Laminectomy and Foraminotomy - Lumbar Five-Sacral One Right;  Surgeon: Kary Kos, MD;  Location: [place];  Service: Neurosurgery;  Laterality: Right;  Right   MULTIPLE TOOTH EXTRACTIONS     "3"   OTOPLASATY Right 12/15/2021   Procedure: Right auricle repair;  Surgeon: Margaretha Sheffield, MD;  Location: [facility] ORS;  Service: ENT;  Laterality: Right;   POSTERIOR LUMBAR FUSION  10/14/2016   L5-S1   REPLACEMENT TOTAL KNEE Left 2003   THORACIC AORTIC ANEURYSM REPAIR  2010   TOTAL KNEE ARTHROPLASTY Right 08/2021   Family History  Problem Relation Age of Onset   Heart failure Mother    Hypertension Mother    Asthma Mother    Osteoporosis Mother    Heart attack Father 75       MI   Hypertension Sister    Prostate cancer Neg Hx    Bladder Cancer Neg Hx    Kidney cancer Neg Hx    Colon cancer Neg Hx    Social  History   Socioeconomic History   Marital status: Widowed    Spouse name: Not on file   Number of children: Not on file   Years of education: Not on file   Highest education level: Not on file  Occupational History   Occupation: retired  Tobacco Use   Smoking status: Former    Packs/day: 1.00    Years: 32.00    Total pack years: 32.00    Types: Cigarettes    Quit date: 07/05/1981    Years since quitting: 40.6   Smokeless tobacco: Never  Vaping Use   Vaping Use: Never used  Substance and Sexual Activity   Alcohol use: Not Currently    Comment: "glass of wine/month; if that"   Drug use: Never   Sexual activity: Not Currently  Other Topics Concern   Not on file  Social History Narrative   Not on file   Social Determinants of Health   Financial Resource Strain: Low Risk  (02/22/2022)   Overall Financial Resource Strain (CARDIA)    Difficulty of Paying Living Expenses: Not hard at all  Food Insecurity: No Food Insecurity (02/22/2022)   Hunger Vital Sign    Worried About Running Out of Food in the Last Year: Never true    [place] in the Last Year: Never true  Transportation Needs: No Transportation Needs (02/22/2022)   PRAPARE - Hydrologist (Medical): No    Lack of Transportation (Non-Medical): No  Physical Activity: Sufficiently Active (02/22/2022)   Exercise Vital Sign    Days of Exercise per Week: 5 days    Minutes of Exercise per Session: 30 min  Stress: No Stress Concern Present (02/22/2022)   [place]    Feeling of Stress : Not at all  Social Connections: Unknown (02/22/2022)   Social Connection and Isolation Panel [NHANES]    Frequency of Communication with Friends and Family: More than three times a week    Frequency of Social  Gatherings with Friends and Family: More than three times a week    Attends Religious Services: More than 4 times per year    Active  Member of Clubs or Organizations: Yes    Attends Music therapist: More than 4 times per year    Marital Status: Not on file    Tobacco Counseling Counseling given: Not Answered   Clinical Intake:  Pre-visit preparation completed: Yes        Diabetes: No  How often do you need to have someone help you when you read instructions, pamphlets, or other written materials from your doctor or pharmacy?: 1 - Never   Interpreter Needed?: No    Activities of Daily Living    02/22/2022    1:07 PM 01/10/2022    6:11 PM  In your present state of health, do you have any difficulty performing the following activities:  Hearing? 0 0  Vision? 0 0  Difficulty concentrating or making decisions? 0 0  Walking or climbing stairs? 1 1  Comment Walker/cane in use when ambulating   Dressing or bathing? 0 0  Doing errands, shopping? 1 1  Preparing Food and eating ? N   Using the Toilet? N   In the past six months, have you accidently leaked urine? N   Do you have problems with loss of bowel control? N   Managing your Medications? N   Managing your Finances? N   Housekeeping or managing your Housekeeping? N     Patient Care Team: [name], MD as PCP - General (Family Medicine) [name] Kathlene November, MD as PCP - Cardiology (Cardiology) Minna Merritts, MD as Consulting Physician (Cardiology) Emmaline Kluver., MD (Rheumatology) Kary Kos, MD as Consulting Physician (Neurosurgery) Jonathon Bellows, MD as Consulting Physician (Gastroenterology)  Indicate any recent Medical Services you may have received from other than [facility] providers in the past year (date may be approximate).     Assessment:   This is a routine wellness examination for Bryan [name].  I connected with  Bryan [name] on 02/22/22 by a audio enabled telemedicine application and verified that I am speaking with the correct person using two identifiers.  Patient Location: Home  Provider Location:  Office/Clinic  I discussed the limitations of evaluation and management by telemedicine. The patient expressed understanding and agreed to proceed.   Hearing/Vision screen Hearing Screening - Comments:: Patient is able to hear conversational tones without difficulty. No issues reported. Vision Screening - Comments:: Followed by [facility]  Semi-annual visits  Wears corrective lenses when reading  Dietary issues and exercise activities discussed: Current Exercise Habits: Home exercise routine, Type of exercise: walking (peddler), Time (Minutes): 30, Frequency (Times/Week): 5, Weekly Exercise (Minutes/Week): 150, Intensity: Mild   Goals Addressed               This Visit's Progress     Patient Stated     Increase physical activity (pt-stated)        Stay active walking for exercise       Depression Screen    02/22/2022    1:14 PM 08/21/2021    4:26 PM 02/06/2021   11:55 AM 02/22/2020   10:35 AM 12/19/2019   12:42 PM 01/24/2019   11:20 AM 12/11/2018   11:29 AM  PHQ 2/9 Scores  PHQ - 2 Score 0 0 0 0 0 0 0  PHQ- 9 Score      0     Fall Risk  02/22/2022    1:08 PM 08/21/2021    4:26 PM 02/06/2021   12:12 PM 08/22/2020   11:40 AM 05/26/2020    1:23 PM  Fall Risk   Falls in the past year? 1 0  0 0  Number falls in past yr: 0 0  0 0  Injury with Fall? 1 0  0 0  Comment Sought medical care about 6 weeks ago      Risk for fall due to : Impaired balance/gait No Fall Risks Impaired balance/gait;History of fall(s) History of fall(s);Impaired mobility   Risk for fall due to: Comment Walker/cane in use when ambulating      Follow up Falls evaluation completed;Falls prevention discussed Falls evaluation completed Falls evaluation completed Falls evaluation completed Falls evaluation completed    FALL RISK PREVENTION PERTAINING TO THE HOME: Home free of loose throw rugs in walkways, pet beds, electrical cords, etc? Yes  Adequate lighting in your home to reduce risk of falls? Yes    ASSISTIVE DEVICES UTILIZED TO PREVENT FALLS: Life alert? No  Use of a cane, walker or w/c? Yes  Grab bars in the bathroom? Yes  Shower chair or bench in shower? Yes  Elevated toilet seat or a handicapped toilet? Yes   TIMED UP AND GO: Was the test performed? No .   Cognitive Function:    07/06/2016    1:33 PM 06/27/2015   10:39 AM  MMSE - Mini Mental State Exam  Orientation to time 5 5  Orientation to Place 5 5  Registration 3 3  Attention/ Calculation 5 5  Recall 3 3  Language- name 2 objects 2 2  Language- repeat 1 1  Language- follow 3 step command 3 3  Language- read & follow direction 1 1  Write a sentence 1 1  Copy design 1 1  Total score 30 30        02/22/2022    1:12 PM 12/11/2018   11:31 AM  6CIT Screen  What Year? 0 points 0 points  What month? 0 points 0 points  What time? 0 points 0 points  Count back from 20 0 points 0 points  Months in reverse 0 points 0 points  Repeat phrase 0 points 0 points  Total Score 0 points 0 points    Immunizations Immunization History  Administered Date(s) Administered   Hepatitis A 06/24/2017   Hepatitis A, Adult 06/24/2017   Influenza Split 04/09/2014   Influenza, High Dose Seasonal PF 05/09/2015, 02/23/2016, 04/28/2017, 03/18/2018   Influenza,inj,Quad PF,6+ Mos 02/21/2019   Influenza-Unspecified 02/15/2020, 01/29/2021, 02/03/2021   PFIZER(Purple Top)SARS-COV-2 Vaccination 06/07/2019, 06/28/2019, 12/23/2019   PPD Test 01/10/2017   Pneumococcal Conjugate-13 12/13/2013   Pneumococcal Polysaccharide-23 05/09/2015   Tdap 12/13/2013   Flu Vaccine status: Due, Education has been provided regarding the importance of this vaccine. Advised may receive this vaccine at local pharmacy or Health Dept. Aware to provide a copy of the vaccination record if obtained from local pharmacy or Health Dept. Verbalized acceptance and understanding.  Pneumococcal vaccine status: Up to date  Covid-19 vaccine status: Completed vaccines  x3.   Shingrix Completed?: No.    Education has been provided regarding the importance of this vaccine. Patient has been advised to call insurance company to determine out of pocket expense if they have not yet received this vaccine. Advised may also receive vaccine at local pharmacy or Health Dept. Verbalized acceptance and understanding.  Screening Tests Health Maintenance  Topic Date Due   COVID-19 Vaccine (  4 - Pfizer risk series) 03/10/2022 (Originally 02/17/2020)   COLONOSCOPY (Pts 45-4yr Insurance coverage will need to be confirmed)  03/25/2022 (Originally 09/24/2021)   Zoster Vaccines- Shingrix (1 of 2) 04/23/2022 (Originally 06/25/1959)   INFLUENZA VACCINE  08/22/2022 (Originally 12/22/2021)   TETANUS/TDAP  12/14/2023   Pneumonia Vaccine 81 Years old  Completed   HPV VACCINES  Aged Out    Health Maintenance There are no preventive care reminders to display for this patient.  Colonoscopy-deferred per patient.   Hepatitis C Screening: does not qualify  Vision Screening: Recommended annual ophthalmology exams for early detection of glaucoma and other disorders of the eye.  Dental Screening: Recommended annual dental exams for proper oral hygiene  Community Resource Referral / Chronic Care Management: CRR required this visit?  No   CCM required this visit?  No      Plan:     I have personally reviewed and noted the following in the patient's chart:   Medical and social history Use of alcohol, tobacco or illicit drugs  Current medications and supplements including opioid prescriptions. Patient is currently taking opioid prescriptions. Information provided to patient regarding non-opioid alternatives. Patient advised to discuss non-opioid treatment plan with their provider. Followed by pain management clinic.  Functional ability and status Nutritional status Physical activity Advanced directives List of other physicians Hospitalizations, surgeries, and ER visits in  previous 12 months Vitals Screenings to include cognitive, depression, and falls Referrals and appointments  In addition, I have reviewed and discussed with patient certain preventive protocols, quality metrics, and best practice recommendations. A written personalized care plan for preventive services as well as general preventive health recommendations were provided to patient.     OVarney Biles LPN   [id number]

## 2022-03-01 ENCOUNTER — Other Ambulatory Visit: Payer: Self-pay | Admitting: Family Medicine

## 2022-03-01 DIAGNOSIS — F419 Anxiety disorder, unspecified: Secondary | ICD-10-CM

## 2022-03-01 DIAGNOSIS — F4329 Adjustment disorder with other symptoms: Secondary | ICD-10-CM

## 2022-03-02 ENCOUNTER — Ambulatory Visit (INDEPENDENT_AMBULATORY_CARE_PROVIDER_SITE_OTHER): Payer: Medicare (Managed Care) | Admitting: Physician Assistant

## 2022-03-02 DIAGNOSIS — T148XXA Other injury of unspecified body region, initial encounter: Secondary | ICD-10-CM

## 2022-03-02 NOTE — Progress Notes (Signed)
   Referring Provider [name], MD [address] [place],  [place] [address]   CC:  Chief Complaint  Patient presents with   Follow-up      DEWIE AHART is an 81 y.o. male.  HPI: This is a 81 year old gentleman seen in our office for follow-up evaluation status post scalp wound.  The patient was most recently seen in the office on 02/02/2022.  At that time he was being evaluated for a wound along the right scalp.  He had sustained a fall and developed a hematoma which caused a pressure injury to the overlying scalp area.  At the time of our office evaluation he was placed on collagen sheets which he has been using.  Since last office visit he notes he has been doing very well the wound on both his ear and scalp have significantly improved with no issues or concerns.  Review of Systems General: negative for fever  Physical Exam    02/22/2022    1:06 PM 02/02/2022   11:24 AM 01/11/2022    4:43 PM  Vitals with BMI  Height '5\' 11"'$  '5\' 11"'$    Weight 156 lbs 156 lbs   BMI 37.90 24.09   Systolic  735 329  Diastolic  69 67  Pulse  60 67    General:  No acute distress,  Alert and oriented, Non-Toxic, Normal speech and affect Right scalp with minimal scarring, no open wounds, no surrounding redness or discharge, no hematomas, right ear with some discoloration but cap refill intact, no wounds to the ear    Assessment/Plan  Overall the patient has healed very well, the scalp wound has completely healed over with no wounds at this time.  The patient does not require any further ongoing management from plastic surgery, if he develops any new or worsening signs or symptoms he will reach out to our office immediately.  Both the patient and his wife verbalized understanding and agreement to today's plan.  Stevie Kern Nishita Isaacks 03/02/2022, 11:31 AM

## 2022-03-08 ENCOUNTER — Other Ambulatory Visit: Payer: Medicare (Managed Care)

## 2022-03-08 ENCOUNTER — Ambulatory Visit: Payer: Medicare (Managed Care) | Admitting: Oncology

## 2022-03-19 ENCOUNTER — Observation Stay
Admission: EM | Admit: 2022-03-19 | Discharge: 2022-03-25 | Disposition: A | Discharge status: Home / Self Care | Payer: Medicare (Managed Care) | Attending: Internal Medicine | Admitting: Internal Medicine

## 2022-03-19 ENCOUNTER — Encounter: Payer: Self-pay | Admitting: *Deleted

## 2022-03-19 ENCOUNTER — Emergency Department: Payer: Medicare (Managed Care)

## 2022-03-19 ENCOUNTER — Other Ambulatory Visit: Payer: Self-pay

## 2022-03-19 DIAGNOSIS — Z85828 Personal history of other malignant neoplasm of skin: Secondary | ICD-10-CM | POA: Insufficient documentation

## 2022-03-19 DIAGNOSIS — D72829 Elevated white blood cell count, unspecified: Secondary | ICD-10-CM | POA: Insufficient documentation

## 2022-03-19 DIAGNOSIS — I110 Hypertensive heart disease with heart failure: Secondary | ICD-10-CM | POA: Diagnosis not present

## 2022-03-19 DIAGNOSIS — I480 Paroxysmal atrial fibrillation: Secondary | ICD-10-CM | POA: Insufficient documentation

## 2022-03-19 DIAGNOSIS — I10 Essential (primary) hypertension: Secondary | ICD-10-CM | POA: Diagnosis present

## 2022-03-19 DIAGNOSIS — E440 Moderate protein-calorie malnutrition: Secondary | ICD-10-CM | POA: Insufficient documentation

## 2022-03-19 DIAGNOSIS — I2510 Atherosclerotic heart disease of native coronary artery without angina pectoris: Secondary | ICD-10-CM | POA: Diagnosis not present

## 2022-03-19 DIAGNOSIS — Z79899 Other long term (current) drug therapy: Secondary | ICD-10-CM | POA: Diagnosis not present

## 2022-03-19 DIAGNOSIS — Z96653 Presence of artificial knee joint, bilateral: Secondary | ICD-10-CM | POA: Diagnosis not present

## 2022-03-19 DIAGNOSIS — I482 Chronic atrial fibrillation, unspecified: Secondary | ICD-10-CM | POA: Diagnosis present

## 2022-03-19 DIAGNOSIS — G9340 Encephalopathy, unspecified: Principal | ICD-10-CM | POA: Diagnosis present

## 2022-03-19 DIAGNOSIS — Z7901 Long term (current) use of anticoagulants: Secondary | ICD-10-CM | POA: Diagnosis not present

## 2022-03-19 DIAGNOSIS — I5032 Chronic diastolic (congestive) heart failure: Secondary | ICD-10-CM | POA: Insufficient documentation

## 2022-03-19 DIAGNOSIS — M069 Rheumatoid arthritis, unspecified: Secondary | ICD-10-CM | POA: Diagnosis present

## 2022-03-19 DIAGNOSIS — I711 Thoracic aortic aneurysm, ruptured, unspecified: Secondary | ICD-10-CM | POA: Insufficient documentation

## 2022-03-19 DIAGNOSIS — E43 Unspecified severe protein-calorie malnutrition: Secondary | ICD-10-CM | POA: Insufficient documentation

## 2022-03-19 DIAGNOSIS — R4182 Altered mental status, unspecified: Secondary | ICD-10-CM | POA: Diagnosis not present

## 2022-03-19 DIAGNOSIS — Z87891 Personal history of nicotine dependence: Secondary | ICD-10-CM | POA: Diagnosis not present

## 2022-03-19 DIAGNOSIS — F0390 Unspecified dementia without behavioral disturbance: Secondary | ICD-10-CM | POA: Diagnosis not present

## 2022-03-19 DIAGNOSIS — R509 Fever, unspecified: Secondary | ICD-10-CM | POA: Diagnosis not present

## 2022-03-19 DIAGNOSIS — R404 Transient alteration of awareness: Secondary | ICD-10-CM | POA: Diagnosis not present

## 2022-03-19 LAB — COMPREHENSIVE METABOLIC PANEL
ALT: 15 U/L (ref 0–44)
AST: 37 U/L (ref 15–41)
Albumin: 4.4 g/dL (ref 3.5–5.0)
Alkaline Phosphatase: 96 U/L (ref 38–126)
Anion gap: 10 (ref 5–15)
BUN: 20 mg/dL (ref 8–23)
CO2: 26 mmol/L (ref 22–32)
Calcium: 9.8 mg/dL (ref 8.9–10.3)
Chloride: 102 mmol/L (ref 98–111)
Creatinine, Ser: 0.87 mg/dL (ref 0.61–1.24)
GFR, Estimated: >60 mL/min (ref >60)
Glucose, Bld: 122 mg/dL — ABNORMAL HIGH (ref 70–99)
Potassium: 3.6 mmol/L (ref 3.5–5.1)
Sodium: 138 mmol/L (ref 135–145)
Total Bilirubin: 0.7 mg/dL (ref 0.3–1.2)
Total Protein: 8 g/dL (ref 6.5–8.1)

## 2022-03-19 LAB — CBC WITH DIFFERENTIAL/PLATELET
Abs Immature Granulocytes: 0.06 10*3/uL (ref 0.00–0.07)
Basophils Absolute: 0 10*3/uL (ref 0.0–0.1)
Basophils Relative: 0 %
Eosinophils Absolute: 0 10*3/uL (ref 0.0–0.5)
Eosinophils Relative: 0 %
HCT: 33.9 % — ABNORMAL LOW (ref 39.0–52.0)
Hemoglobin: 10.7 g/dL — ABNORMAL LOW (ref 13.0–17.0)
Immature Granulocytes: 1 %
Lymphocytes Relative: 3 %
Lymphs Abs: 0.3 10*3/uL — ABNORMAL LOW (ref 0.7–4.0)
MCH: 29.5 pg (ref 26.0–34.0)
MCHC: 31.6 g/dL (ref 30.0–36.0)
MCV: 93.4 fL (ref 80.0–100.0)
Monocytes Absolute: 0.9 10*3/uL (ref 0.1–1.0)
Monocytes Relative: 8 %
Neutro Abs: 10.2 10*3/uL — ABNORMAL HIGH (ref 1.7–7.7)
Neutrophils Relative %: 88 %
Platelets: 140 10*3/uL — ABNORMAL LOW (ref 150–400)
RBC: 3.63 MIL/uL — ABNORMAL LOW (ref 4.22–5.81)
RDW: 14.9 % (ref 11.5–15.5)
WBC: 11.5 10*3/uL — ABNORMAL HIGH (ref 4.0–10.5)
nRBC: 0 % (ref 0.0–0.2)

## 2022-03-19 LAB — URINALYSIS, ROUTINE W REFLEX MICROSCOPIC
Bacteria, UA: NONE SEEN
Bilirubin Urine: NEGATIVE
Glucose, UA: NEGATIVE mg/dL
Hgb urine dipstick: NEGATIVE
Ketones, ur: NEGATIVE mg/dL
Leukocytes,Ua: NEGATIVE
Nitrite: NEGATIVE
Protein, ur: >=300 mg/dL — AB
Specific Gravity, Urine: 1.019 (ref 1.005–1.030)
Squamous Epithelial / HPF: NONE SEEN (ref 0–5)
pH: 6 (ref 5.0–8.0)

## 2022-03-19 LAB — AMMONIA: Ammonia: 26 umol/L (ref 9–35)

## 2022-03-19 LAB — CK: Total CK: 339 U/L (ref 49–397)

## 2022-03-19 LAB — PROCALCITONIN: Procalcitonin: <0.1 ng/mL

## 2022-03-19 LAB — LACTIC ACID, PLASMA: Lactic Acid, Venous: 1.2 mmol/L (ref 0.5–1.9)

## 2022-03-19 LAB — CBG MONITORING, ED: Glucose-Capillary: 118 mg/dL — ABNORMAL HIGH (ref 70–99)

## 2022-03-19 MED ORDER — PANTOPRAZOLE SODIUM 40 MG PO TBEC
40.0000 mg | DELAYED_RELEASE_TABLET | Freq: Every day | ORAL | Status: DC
  Administered 2022-03-20 – 2022-03-25 (×6): 40 mg via ORAL
  Filled 2022-03-19 (×6): qty 1

## 2022-03-19 MED ORDER — AMLODIPINE BESYLATE 5 MG PO TABS
5.0000 mg | ORAL_TABLET | Freq: Every day | ORAL | Status: DC
  Administered 2022-03-20 – 2022-03-25 (×6): 5 mg via ORAL
  Filled 2022-03-19 (×6): qty 1

## 2022-03-19 MED ORDER — ACETAMINOPHEN 325 MG PO TABS
650.0000 mg | ORAL_TABLET | Freq: Four times a day (QID) | ORAL | Status: DC | PRN
  Administered 2022-03-23 – 2022-03-25 (×4): 650 mg via ORAL
  Filled 2022-03-19 (×4): qty 2

## 2022-03-19 MED ORDER — POLYETHYLENE GLYCOL 3350 17 G PO PACK: 17.0000 g | PACK | Freq: Every day | ORAL | Status: DC | PRN

## 2022-03-19 MED ORDER — ACETAMINOPHEN 650 MG RE SUPP: 650.0000 mg | Freq: Four times a day (QID) | RECTAL | Status: DC | PRN

## 2022-03-19 MED ORDER — SODIUM CHLORIDE 0.9% FLUSH
3.0000 mL | Freq: Two times a day (BID) | INTRAVENOUS | Status: DC
  Administered 2022-03-19 – 2022-03-21 (×4): 3 mL via INTRAVENOUS

## 2022-03-19 MED ORDER — FOLIC ACID 1 MG PO TABS
1.0000 mg | ORAL_TABLET | Freq: Every day | ORAL | Status: DC
  Administered 2022-03-20 – 2022-03-25 (×6): 1 mg via ORAL
  Filled 2022-03-19 (×6): qty 1

## 2022-03-19 MED ORDER — FLUOXETINE HCL 20 MG PO CAPS
20.0000 mg | ORAL_CAPSULE | Freq: Every day | ORAL | Status: DC
  Administered 2022-03-20 – 2022-03-25 (×6): 20 mg via ORAL
  Filled 2022-03-19 (×6): qty 1

## 2022-03-19 MED ORDER — HYDROCODONE-ACETAMINOPHEN 10-325 MG PO TABS: 1.0000 | ORAL_TABLET | Freq: Four times a day (QID) | ORAL | Status: DC | PRN

## 2022-03-19 MED ORDER — ATORVASTATIN CALCIUM 20 MG PO TABS
40.0000 mg | ORAL_TABLET | Freq: Every day | ORAL | Status: DC
  Administered 2022-03-20 – 2022-03-25 (×6): 40 mg via ORAL
  Filled 2022-03-19 (×6): qty 2

## 2022-03-19 MED ORDER — THIAMINE MONONITRATE 100 MG PO TABS
100.0000 mg | ORAL_TABLET | Freq: Every day | ORAL | Status: DC
  Administered 2022-03-20 – 2022-03-25 (×6): 100 mg via ORAL
  Filled 2022-03-19 (×6): qty 1

## 2022-03-19 MED ORDER — ADULT MULTIVITAMIN W/MINERALS CH
1.0000 | ORAL_TABLET | Freq: Every day | ORAL | Status: DC
  Administered 2022-03-20 – 2022-03-25 (×6): 1 via ORAL
  Filled 2022-03-19 (×6): qty 1

## 2022-03-19 MED ORDER — LORATADINE 10 MG PO TABS
10.0000 mg | ORAL_TABLET | Freq: Every day | ORAL | Status: DC
  Administered 2022-03-20 – 2022-03-25 (×6): 10 mg via ORAL
  Filled 2022-03-19 (×6): qty 1

## 2022-03-19 MED ORDER — FERROUS SULFATE 325 (65 FE) MG PO TABS
325.0000 mg | ORAL_TABLET | Freq: Every day | ORAL | Status: DC
  Administered 2022-03-20 – 2022-03-25 (×6): 325 mg via ORAL
  Filled 2022-03-19 (×6): qty 1

## 2022-03-19 MED ORDER — IPRATROPIUM BROMIDE 0.06 % NA SOLN
2.0000 | Freq: Four times a day (QID) | NASAL | Status: DC
  Administered 2022-03-20 – 2022-03-25 (×21): 2 via NASAL
  Filled 2022-03-19: qty 15

## 2022-03-19 MED ORDER — LISINOPRIL 20 MG PO TABS
30.0000 mg | ORAL_TABLET | Freq: Every day | ORAL | Status: DC
  Administered 2022-03-20 – 2022-03-25 (×6): 30 mg via ORAL
  Filled 2022-03-19 (×6): qty 1

## 2022-03-19 NOTE — ED Notes (Signed)
[facility]isters at [facility] report, "spoke with pt last night at 2030 and everything seemed normal, arrived to take pt to the store this morning and everything was a wreck, pills everywhere, usually very tidy".

## 2022-03-19 NOTE — ED Notes (Signed)
Repetitive speech/ answers, but speech clear. Says "yes" inappropriately to many questions, but will follow commands accurately. Will also follow commands repetitively even when command has changed. No obvious weakness, droop or drift, but unable to follow LE ataxia commands.

## 2022-03-19 NOTE — ED Notes (Signed)
Resting comfortably, NAD, calm. Family at [facility] x2 speaking with registration.

## 2022-03-19 NOTE — Assessment & Plan Note (Addendum)
No longer on Eliquis due to previous hematoma.  Telemetry with some slow ventricular rate, but generally ventricular rate within normal limits.  - Telemetry monitoring

## 2022-03-19 NOTE — ED Provider Notes (Signed)
After discussing case and receiving care from Dr. Rip Harbour, I consulted with our hospitalist Dr. Charleen Kirks and is excepting of plan for admission and further work-up for patient's presentation of altered mental status.  Procalcitonin, CK, and MRI brain are pending at this time and will be followed up by hospitalist service.   Delman Kitten, MD 03/19/22 1600

## 2022-03-19 NOTE — Assessment & Plan Note (Signed)
No longer on DMARDs.  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

## 2022-03-19 NOTE — Assessment & Plan Note (Signed)
Mild leukocytosis noted on admission.  I suspect this may be reactive in the setting of encephalopathy of unknown etiology.  Low suspicion for infectious etiology as patient is afebrile.  Urinalysis with no pyuria.  Blood cultures already obtained in the ED, will follow results  - CBC in the a.m. - Blood cultures pending

## 2022-03-19 NOTE — ED Notes (Signed)
Request made for transport to the floor ?

## 2022-03-19 NOTE — ED Provider Notes (Signed)
[facility] Provider Note    Event Date/Time   First MD Initiated Contact with Patient 03/19/22 1323     (approximate)   History   Altered Mental Status   HPI  HARPER SMOKER is a 81 y.o. male who comes from home.  Sister called him and found he is altered.  Usually he is awake alert and oriented x4 now he is only oriented to name and answers every question "yes sir."  He will follow some commands.  Review of his old records shows multiple visits for hematomas of the scalp, falls and a history of atrial fibrillation.  Patient does take Eliquis.   Past medical history includes coronary artery disease, hyperlipidemia, history of thoracic aneurysm repair valvular heart disease A-fib hypertension rheumatoid arthritis schizophrenia and depression.   Physical Exam   Triage Vital Signs: ED Triage Vitals  Enc Vitals Group     BP 03/19/22 1327 (!) 153/83     Pulse Rate 03/19/22 1327 62     Resp 03/19/22 1327 (!) 34     Temp 03/19/22 1327 98 F (36.7 C)     Temp Source 03/19/22 1327 Axillary     SpO2 03/19/22 1319 98 %     Weight 03/19/22 1327 150 lb (68 kg)     Height 03/19/22 1327 6' (1.829 m)     Head Circumference --      Peak Flow --      Pain Score 03/19/22 1327 0     Pain Loc --      Pain Edu? --      Excl. in [place]? --     Most recent vital signs: Vitals:   03/19/22 1415 03/19/22 1430  BP: (!) 146/80 (!) 153/65  Pulse: 84 60  Resp:  18  Temp:    SpO2: 98% 97%    General: Awake, no distress.  Very pale.  Only answers yes or as noted above.  He will follow most commands. Head normocephalic atraumatic Eyes patient keeps his eyes closed however when I open his eyes there midposition and appear reactive. Mouth no erythema or exudate Neck is supple and nontender Chest: No bruises no apparent tenderness CV:  Good peripheral perfusion.  Heart regular rate and rhythm no audible murmurs Resp:  Normal effort.  Lungs appear clear patient is  not breathing very deeply Abd:  No distention.  Soft nontender no bruising Extremities no edema there is a superficial abrasion on the right leg anteriorly.   ED Results / Procedures / Treatments   Labs (all labs ordered are listed, but only abnormal results are displayed) Labs Reviewed  COMPREHENSIVE METABOLIC PANEL - Abnormal; Notable for the following components:      Result Value   Glucose, Bld 122 (*)    All other components within normal limits  CBC WITH DIFFERENTIAL/PLATELET - Abnormal; Notable for the following components:   WBC 11.5 (*)    RBC 3.63 (*)    Hemoglobin 10.7 (*)    HCT 33.9 (*)    Platelets 140 (*)    Neutro Abs 10.2 (*)    Lymphs Abs 0.3 (*)    All other components within normal limits  CBG MONITORING, ED - Abnormal; Notable for the following components:   Glucose-Capillary 118 (*)    All other components within normal limits  CULTURE, BLOOD (SINGLE)  LACTIC ACID, PLASMA  LACTIC ACID, PLASMA  URINALYSIS, ROUTINE W REFLEX MICROSCOPIC  PROCALCITONIN  CK  AMMONIA  EKG  EKG read and interpreted by me does appear to show a flutter with variable response.  Left axis no acute ST-T wave changes   RADIOLOGY CT of the head done reviewed by me interpreted by me does not appear to be significantly different from CT from July of this year.  We will await the reading by radiology however. ----------------------------------------- 2:54 PM on 03/19/2022 ----------------------------------------- Radiology agrees with the CT Chest x-ray read by radiology reviewed and interpreted by me does seem to show some faint left midlung opacities.  PROCEDURES:  Critical Care performed: Critical care time 25 minutes.  This includes speaking to both his sisters and examining him reviewing his old records and signing the patient out.  Procedures   MEDICATIONS ORDERED IN ED: Medications - No data to display   IMPRESSION / MDM / [place] / ED COURSE  I  reviewed the triage vital signs and the nursing notes. ----------------------------------------- 2:29 PM on 03/19/2022 ----------------------------------------- Patient's sisters have come.  They report he was normal last night but this morning 1130 he was confused.  They found him down.  His pills were scattered around.  He may have taken some extra gabapentin or not sure.  He also does take opioids for pain.  The only time they seen him act like this is after anesthesia.  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  Differential diagnosis includes, but is not limited to, stroke, accidental overdose, intentional overdose, other metabolic encephalopathy.  Patient's presentation is most consistent with acute presentation with potential threat to life or bodily function.  The patient is on the cardiac monitor to evaluate for evidence of arrhythmia and/or significant heart rate changes.  I have signed the patient out to oncoming physician pending return of more results.  We will plan on admitting this patient.  FINAL CLINICAL IMPRESSION(S) / ED DIAGNOSES   Final diagnoses:  Altered mental status, unspecified altered mental status type     Rx / DC Orders   ED Discharge Orders     None        Note:  This document was prepared using Dragon voice recognition software and may include unintentional dictation errors.   Nena Polio, MD 03/19/22 [phone]

## 2022-03-19 NOTE — Assessment & Plan Note (Signed)
-   Restart home antihypertensives after SLP evaluation

## 2022-03-19 NOTE — ED Triage Notes (Signed)
BIB [place] from home, sister called for AMS, MS change, usually A&Ox4, now oriented to name. Fever noted to be 101 PTA. BS 160. VSS/WDL. Follows some commands, some of the time. Abrasion noted to R shin, but no known fall. No recent sickness. H/o afib. Afib on monitor HR 60-70. Arrives alert, NAD, calm, interactive. Answers yes to many questions inappropriately. No obvious weakness, or droop. MAEx4. PERRL 98m bilaterally.

## 2022-03-19 NOTE — Consult Note (Signed)
To be seen 10/28

## 2022-03-19 NOTE — ED Notes (Signed)
EDP at BS 

## 2022-03-19 NOTE — ED Notes (Signed)
Admitting MD at BS.  

## 2022-03-19 NOTE — H&P (Signed)
History and Physical    Patient: Bryan [name] OEU:[id number] DOB: 1941-03-23 DOA: 03/19/2022 DOS: the patient was seen and examined on 03/19/2022 PCP: [name], MD  Patient coming from: Home  Chief Complaint:  Chief Complaint  Patient presents with   Altered Mental Status   HPI: Bryan [name] is a 81 y.o. male with medical history significant of underlying dementia, atrial fibrillation on Eliquis, HFpEF, rheumatoid arthritis not on DMARDs, hypertension, hyperlipidemia, who presents to the ED with altered mental status.  History obtained from patient's daughters at bedside.   Patient's daughter, Bryan [name], states that she spoke to her father last night at approximately 8:30 PM, at which time he behaved normally via telephone.  He was aware that she was coming to see him on 10/27 at 11 AM.  When she arrived today, she was concerned that patient was not coming to the door.  She reports having to bang on the door several times before he came and opened it.  Then without speaking, he walked to a recliner and sat down.  She denies seeing any gait abnormality but notes he had to hold onto the wall to walk.  When she attempted to talk to him, he would repeat the same statement, "yes ma'am" or "yes darling."  She noticed that his apartment was disheveled and there was a spilled bottle of gabapentin near him.  She noticed that his pants and bed were wet with urine.  Bryan [name] states he has not reported any recent illnesses or changes in medication.  Both Bryan [name] and her sister are concerned that Mr. Wich is taking opioids and gabapentin together.  ED course: On arrival to the ED, patient was hypertensive at 149/71 with heart rate of 62.  He was saturating at 99% on room air.  He was afebrile at 98.  Initial blood work demonstrated normal electrolytes, renal function, LFTs.  CBC demonstrated chronic pancytopenia.  CK and lactic acid within normal levels.  Urinalysis unremarkable.  CT head  and MRI with no acute intracranial abnormalities.  TRH contacted for admission for acute encephalopathy.  Review of Systems: As mentioned in the history of present illness. All other systems reviewed and are negative. Past Medical History:  Diagnosis Date   Abnormal CT scan    Asymmetric left rectal wall thickening    Anemia    Anxiety    Arrhythmia    Basal cell carcinoma 04/28/2020   L forehead - ED&C 06/17/2020   Basal cell carcinoma 05/06/2021   left scalp postauricular ant, EDC   Basal cell carcinoma 05/06/2021   left scalp postauricular post, EDC   CHF (congestive heart failure) ([place])    Chronic lower back pain    Complication of anesthesia    Memory loss 09/2015   Coronary artery disease    Depression    GERD (gastroesophageal reflux disease)    Glaucoma    H/O thoracic aortic aneurysm repair    Headache    Heart murmur    History of being hospitalized    memory lose kidney funtion down blood pressure up   History of blood transfusion    "think he had one when he had heart valve OR" (10/14/2016); "none since" (10/19/2017)   History of chicken pox    Hyperlipidemia    Hypertension    Lumbar stenosis    Murmur    Neuropathy    Osteoarthritis    worse in feet and ankles   Paroxysmal A-fib ([place])  Poor short term memory    takes Aricept   Rheumatoid arthritis ([place])    "all over" (10/14/2016)   Schizophrenia ([place])    Seasonal allergies    Thoracic spine pain    Valvular heart disease    Wears glasses    reading   Past Surgical History:  Procedure Laterality Date   AORTIC VALVE REPLACEMENT  2007   [facility].  [place], [place]; "pig valve"   APPENDECTOMY  2010   BACK SURGERY     CARDIAC VALVE REPLACEMENT     CATARACT EXTRACTION W/ INTRAOCULAR LENS  IMPLANT, BILATERAL Bilateral 2012   COLONOSCOPY WITH PROPOFOL N/A 09/24/2016   Procedure: COLONOSCOPY WITH PROPOFOL;  Surgeon: Jonathon Bellows, MD;  Location: [facility] ENDOSCOPY;  Service: Endoscopy;  Laterality: N/A;    ESOPHAGOGASTRODUODENOSCOPY (EGD) WITH PROPOFOL N/A 09/24/2016   Procedure: ESOPHAGOGASTRODUODENOSCOPY (EGD) WITH PROPOFOL;  Surgeon: Jonathon Bellows, MD;  Location: [facility] ENDOSCOPY;  Service: Endoscopy;  Laterality: N/A;   GIVENS CAPSULE STUDY N/A 09/24/2016   Procedure: GIVENS CAPSULE STUDY;  Surgeon: Jonathon Bellows, MD;  Location: [facility] ENDOSCOPY;  Service: Endoscopy;  Laterality: N/A;   HAMMER TOE SURGERY Right 10/09/2015   Procedure: HAMMER TOE REPAIR WITH K-WIRE FIXATION RIGHT SECOND TOE;  Surgeon: Albertine Patricia, DPM;  Location: [place];  Service: Podiatry;  Laterality: Right;  WITH LOCAL   HARDWARE REMOVAL N/A 11/02/2019   Procedure: Removal of hardware thoracic spine;  Surgeon: Kary Kos, MD;  Location: [place];  Service: Neurosurgery;  Laterality: N/A;  posterior   INGUINAL HERNIA REPAIR Right 05/05/2018   Medium Bard PerFix plug.  Surgeon: Robert Bellow, MD;  Location: [facility] ORS;  Service: General;  Laterality: Right;   IR VERTEBROPLASTY CERV/THOR BX INC UNI/BIL INC/INJECT/IMAGING  10/27/2018   JOINT REPLACEMENT Left    left total knee   LAMINECTOMY WITH POSTERIOR LATERAL ARTHRODESIS LEVEL 3 N/A 09/28/2017   Procedure: LUMBAR  POSTEROR  FUSION REVISION - LUMBAR ONE -TWO, LUMBAR TWO -THREE,THREE-FOUR ,BILATERAL ARTHRODESIS REMOVAL LUMBAR THREE HARDWARE;  Surgeon: Kary Kos, MD;  Location: [place];  Service: Neurosurgery;  Laterality: N/A;   LAMINECTOMY WITH POSTERIOR LATERAL ARTHRODESIS LEVEL 3 N/A 10/20/2017   Procedure: Revision fusion and removal of hardware Lumbar one, Pedicle screw fixation thoracic ten-lumbar two, thoracic nine-ten laminotomy, Posterior Lumbar Arthrodesis thoracic ten-lumbar two ;  Surgeon: Kary Kos, MD;  Location: [place];  Service: Neurosurgery;  Laterality: N/A;   LAPAROSCOPIC CHOLECYSTECTOMY  2010   LUMBAR FUSION Right 06/08/2017   LUMBAR THREE-FOUR, LUMBAR FOUR-FIVE POSTEROLATERAL ARTHRODESIS WITH RIGHT LUMBAR FOUR-FIVE LAMINECTOMY/FORAMINOTOMY   LUMBAR  LAMINECTOMY/DECOMPRESSION MICRODISCECTOMY Right 03/08/2016   Procedure: Laminectomy and Foraminotomy - Lumbar Five-Sacral One Right;  Surgeon: Kary Kos, MD;  Location: [place];  Service: Neurosurgery;  Laterality: Right;  Right   MULTIPLE TOOTH EXTRACTIONS     "3"   OTOPLASATY Right 12/15/2021   Procedure: Right auricle repair;  Surgeon: Margaretha Sheffield, MD;  Location: [facility] ORS;  Service: ENT;  Laterality: Right;   POSTERIOR LUMBAR FUSION  10/14/2016   L5-S1   REPLACEMENT TOTAL KNEE Left 2003   THORACIC AORTIC ANEURYSM REPAIR  2010   TOTAL KNEE ARTHROPLASTY Right 08/2021   Social History:  reports that he quit smoking about 40 years ago. His smoking use included cigarettes. He has a 32.00 pack-year smoking history. He has never used smokeless tobacco. He reports that he does not currently use alcohol. He reports that he does not use drugs.  Allergies  Allergen Reactions   Penicillins  Hives, Swelling and Other (See Comments)    SWELLING REACTION UNSPECIFIED PATIENT HAS TAKEN AMOXICILLIN ON MED HX FROM [facility] PATIENT HAS HAD A PCN REACTION WITH IMMEDIATE RASH, FACIAL/TONGUE/THROAT SWELLING, SOB, OR LIGHTHEADEDNESS WITH HYPOTENSION:  #  #  #  YES  #  #  #   Has patient had a PCN reaction causing severe rash involving mucus membranes or skin necrosis: No Has patient had a PCN reaction that required hospitalization No Has patient had a PCN reaction occurring within the last 10 years: No   Demerol [Meperidine] Hives and Nausea And Vomiting   Other     Family History  Problem Relation Age of Onset   Heart failure Mother    Hypertension Mother    Asthma Mother    Osteoporosis Mother    Heart attack Father 85       MI   Hypertension Sister    Prostate cancer Neg Hx    Bladder Cancer Neg Hx    Kidney cancer Neg Hx    Colon cancer Neg Hx     Prior to Admission medications   Medication Sig Start Date End Date Taking? Authorizing Provider  amLODipine (NORVASC) 5 MG tablet TAKE 1 TABLET BY  MOUTH DAILY. 12/21/21  Yes Minna Merritts, MD  atorvastatin (LIPITOR) 40 MG tablet TAKE 1 TABLET BY MOUTH DAILY 09/22/21  Yes [name], MD  calcium carbonate (OS-CAL - DOSED IN MG OF ELEMENTAL CALCIUM) 1250 (500 Ca) MG tablet Take 1 tablet by mouth daily with lunch.   Yes [provider]  donepezil (ARICEPT) 10 MG tablet Take 1 tablet by mouth at bedtime. 09/25/21  Yes [provider]  ferrous sulfate 325 (65 FE) MG tablet Take by mouth.   Yes [provider]  FLUoxetine (PROZAC) 20 MG capsule TAKE 1 CAPSULE BY MOUTH ONCE DAILY 03/01/22  Yes [name], MD  gabapentin (NEURONTIN) 300 MG capsule TAKE 1 CAPSULE BY MOUTH 3 TIMES DAILY. 07/09/21  Yes [name], MD  HYDROcodone-acetaminophen (NORCO) 10-325 MG tablet Take 1 tablet by mouth 4 (four) times daily as needed for moderate pain.   Yes [provider]  lisinopril (ZESTRIL) 20 MG tablet Take 30 mg by mouth daily. 03/08/16  Yes [provider]  loratadine (CLARITIN) 10 MG tablet Take 10 mg by mouth daily.   Yes [provider]  omeprazole (PRILOSEC) 40 MG capsule TAKE 1 CAPSULE EVERY DAY 06/25/21  Yes Jonathon Bellows, MD  vitamin B-12 (CYANOCOBALAMIN) 1000 MCG tablet Take 1,000 mcg by mouth daily with lunch.    Yes [provider]  Vitamin D, Ergocalciferol, (DRISDOL) 1.25 MG (50000 UNIT) CAPS capsule Take 1 capsule by mouth on Monday and Thursday. 02/10/22  Yes [name], MD  vitamin E 180 MG (400 UNITS) capsule Take 800 Units by mouth daily with lunch.   Yes [provider]  fenofibrate 160 MG tablet TAKE ONE TABLET AT BEDTIME Patient not taking: Reported on 03/19/2022 09/22/21   Minna Merritts, MD  hydrocortisone cream 1 % Apply 1 application topically daily as needed for itching.    [provider]  ipratropium (ATROVENT) 0.06 % nasal spray Place 2 sprays into both nostrils 4 (four) times daily. 12/30/21   [name], MD  loperamide  (IMODIUM) 2 MG capsule Take 4 mg by mouth in the morning and at bedtime.    [provider]  Multiple Vitamins-Minerals (PRESERVISION AREDS 2) CAPS Take 1 capsule by mouth  daily. Patient not taking: Reported on 03/19/2022    [provider]    Physical Exam: Vitals:   03/19/22 1812 03/19/22 1815 03/19/22 1830 03/19/22 1845  BP:  (!) 164/71 (!) 162/69 (!) 162/83  Pulse:  68  67  Resp:  15 (!) 23   Temp: 97.9 F (36.6 C)     TempSrc: Axillary     SpO2:  99% 100% 98%  Weight:      Height:       Physical Exam Vitals and nursing note reviewed.  Constitutional:      General: He is not in acute distress.    Appearance: He is normal weight. He is not toxic-appearing.  HENT:     Head: Normocephalic and atraumatic.     Mouth/Throat:     Mouth: Mucous membranes are moist.     Pharynx: Oropharynx is clear.  Eyes:     Extraocular Movements: Extraocular movements intact.     Conjunctiva/sclera: Conjunctivae normal.     Pupils: Pupils are equal, round, and reactive to light.  Cardiovascular:     Rate and Rhythm: Normal rate. Rhythm irregularly irregular.     Heart sounds: Murmur (2/6 systolic murmur) heard.  Pulmonary:     Effort: Pulmonary effort is normal. No respiratory distress.     Breath sounds: Normal breath sounds. No wheezing, rhonchi or rales.  Abdominal:     General: Bowel sounds are normal. There is no distension.     Palpations: Abdomen is soft.     Tenderness: There is no abdominal tenderness. There is no guarding.  Musculoskeletal:        General: No tenderness.     Right lower leg: No edema.     Left lower leg: No edema.  Skin:    General: Skin is warm and dry.     Comments: Small skin tear with dried blood on the right shin.  Neurological:     Mental Status: He is alert.     Comments:  Patient is able to state his name, but is otherwise disoriented to location, time and situation.  When prompted though, he can correctly answer that he is in  [place].   Intermittently able to follow some commands, but requires repetitive prompting.   Cranial nerves II to XII intact  5 out of 5 strength of bilateral upper and lower extremities  No asterixis noted  Psychiatric:        Cognition and Memory: Cognition is impaired.    Data Reviewed: CBC with elevated white blood count at 11.5, hemoglobin of 10.7, and platelets of 140.  CMP with glucose of 122, but no electrolyte, renal or hepatic dysfunction noted.  Lactic acid negative.  Procalcitonin negative.  CK within normal limits.  Ammonia within normal limits.  Urinalysis notable for proteinuria only.  EKG personally reviewed.  Consistent with atrial fibrillation with normal ventricular rate.  No acute ST or T wave changes.  MR BRAIN WO CONTRAST  Result Date: 03/19/2022 CLINICAL DATA:  Mental status change, unknown cause. EXAM: MRI HEAD WITHOUT CONTRAST TECHNIQUE: Multiplanar, multiecho pulse sequences of the brain and surrounding structures were obtained without intravenous contrast. COMPARISON:  Head CT March 19, 2022. FINDINGS: The study is partially degraded by motion. Brain: No acute infarction, hemorrhage, hydrocephalus, extra-axial collection or mass lesion. Scattered and confluent foci of T2 hyperintensity are seen within the white matter of the cerebral hemispheres, nonspecific, most likely related to chronic small vessel ischemia. Mild parenchymal volume loss. Remote small  infarcts in the bilateral cerebellar hemispheres. Vascular: Normal flow voids. Skull and upper cervical spine: Normal marrow signal. Sinuses/Orbits: Negative. Other: None. IMPRESSION: 1. No acute intracranial abnormality. 2. Moderate chronic microvascular ischemic changes of the white matter. 3. 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 Electronically Signed   By: Pedro Earls M.D.   On: 03/19/2022 17:22   CT Head Wo Contrast  Result Date: 03/19/2022 CLINICAL DATA:   Mental status change, unknown cause EXAM: CT HEAD WITHOUT CONTRAST TECHNIQUE: Contiguous axial images were obtained from the base of the skull through the vertex without intravenous contrast. RADIATION DOSE REDUCTION: This exam was performed according to the departmental dose-optimization program which includes automated exposure control, adjustment of the mA and/or kV according to patient size and/or use of iterative reconstruction technique. COMPARISON:  CT head 12/14/2021. FINDINGS: Brain: No evidence of acute infarction, hemorrhage, hydrocephalus, extra-axial collection or mass lesion/mass effect. Patchy white matter hypodensities, nonspecific but compatible with chronic microvascular ischemic disease. Vascular: No hyperdense vessel identified. Skull: No acute fracture. Sinuses/Orbits: Clear sinuses.  No acute orbital findings. Other: No mastoid effusions. IMPRESSION: No evidence of acute intracranial abnormality. Electronically Signed   By: Margaretha Sheffield M.D.   On: 03/19/2022 14:33   DG Chest Portable 1 View  Result Date: 03/19/2022 CLINICAL DATA:  Altered mental status EXAM: PORTABLE CHEST 1 VIEW COMPARISON:  11/12/2019 FINDINGS: Prior median sternotomy. Unchanged cardiomegaly. Patchy left mid peripheral opacities. No pleural effusion. No evidence of pneumothorax. No acute osseous abnormality. Thoracolumbar fusion hardware noted. Thoracic spondylosis. Multiple and mid to lower thoracic compression deformities and vertebroplasties. IMPRESSION: Faint peripheral left mid lung opacities, could be scarring or developing infection. Recommend short-term radiographic follow-up. Electronically Signed   By: Maurine Simmering M.D.   On: 03/19/2022 14:01    Results are pending, will review when available.  Assessment and Plan: * Acute encephalopathy Patient presenting with 24-hour history of altered mental status, including disorientation.  No focal weak mass on examination.  CT head and MRI have ruled out brain  lesions including CVA.  Etiology at this time is undetermined.  Differential includes toxic metabolic encephalopathy in the setting of combined gabapentin and opioid use.  Patient's daughter states many gabapentin pills or surrounding her patient was sitting.  Differential also includes seizure with a postictal state given patient had an incontinent episode that is unusual for him.  Overall, patient seems to be slowly improving as he is able to answer some questions appropriately.  - Neurology consulted; appreciate their recommendations - Frequent neurochecks - Telemetry monitoring - Echocardiogram - N.p.o. until SLP evaluation - Hold centrally acting medications with the exception of as needed home Norco to avoid withdrawal - Monitor for seizure activity - Lab work still pending includes TSH, vitamin B12, vitamin B1, RPR, MMA, HIV, gabapentin titer  Leukocytosis Mild leukocytosis noted on admission.  I suspect this may be reactive in the setting of encephalopathy of unknown etiology.  Low suspicion for infectious etiology as patient is afebrile.  Urinalysis with no pyuria.  Blood cultures already obtained in the ED, will follow results  - CBC in the a.m. - Blood cultures pending  Atrial fibrillation, chronic (HCC) No longer on Eliquis due to previous hematoma.  Telemetry with some slow ventricular rate, but generally ventricular rate within normal limits.  - Telemetry monitoring  Essential hypertension - Restart home antihypertensives after SLP evaluation  Rheumatoid arthritis ([place]) No longer on DMARDs.  Per chart review, previous pancytopenia felt to be  secondary to RA.   Advance Care Planning:   Code Status: DNR.  Discussed CODE STATUS with patient's 2 daughters at bedside.  They state Mr. Zerbe would not want to be artificially kept alive and has previously told him he would not want to be resuscitated.  Due to this, they opted for DNI DNR.  Consults: Neurology  Family  Communication: Patient's 2 daughters updated at bedside  Severity of Illness: The appropriate patient status for this patient is OBSERVATION. Observation status is judged to be reasonable and necessary in order to provide the required intensity of service to ensure the patient's safety. The patient's presenting symptoms, physical exam findings, and initial radiographic and laboratory data in the context of their medical condition is felt to place them at decreased risk for further clinical deterioration. Furthermore, it is anticipated that the patient will be medically stable for discharge from the hospital within 2 midnights of admission.   Author: Jose Persia, MD 03/19/2022 7:21 PM  For on call review [email or web address].

## 2022-03-19 NOTE — Assessment & Plan Note (Addendum)
Patient presenting with 24-hour history of altered mental status, including disorientation.  No focal weak mass on examination.  CT head and MRI have ruled out brain lesions including CVA.  Etiology at this time is undetermined.  Differential includes toxic metabolic encephalopathy in the setting of combined gabapentin and opioid use.  Patient's daughter states many gabapentin pills or surrounding her patient was sitting.  Differential also includes seizure with a postictal state given patient had an incontinent episode that is unusual for him.  Overall, patient seems to be slowly improving as he is able to answer some questions appropriately.  - Neurology consulted; appreciate their recommendations - Frequent neurochecks - Telemetry monitoring - Echocardiogram - N.p.o. until SLP evaluation - Hold centrally acting medications with the exception of as needed home Norco to avoid withdrawal - Monitor for seizure activity - Lab work still pending includes TSH, vitamin B12, vitamin B1, RPR, MMA, HIV, gabapentin titer

## 2022-03-20 ENCOUNTER — Observation Stay
Admit: 2022-03-20 | Discharge: 2022-03-20 | Disposition: A | Discharge status: Home / Self Care | Payer: Medicare (Managed Care) | Attending: Internal Medicine | Admitting: Internal Medicine

## 2022-03-20 ENCOUNTER — Encounter: Payer: Self-pay | Admitting: Internal Medicine

## 2022-03-20 DIAGNOSIS — I480 Paroxysmal atrial fibrillation: Secondary | ICD-10-CM | POA: Diagnosis not present

## 2022-03-20 DIAGNOSIS — F0390 Unspecified dementia without behavioral disturbance: Secondary | ICD-10-CM

## 2022-03-20 DIAGNOSIS — G9340 Encephalopathy, unspecified: Secondary | ICD-10-CM | POA: Diagnosis not present

## 2022-03-20 DIAGNOSIS — I5032 Chronic diastolic (congestive) heart failure: Secondary | ICD-10-CM | POA: Diagnosis not present

## 2022-03-20 LAB — CBC
HCT: 33.3 % — ABNORMAL LOW (ref 39.0–52.0)
Hemoglobin: 10.7 g/dL — ABNORMAL LOW (ref 13.0–17.0)
MCH: 29.3 pg (ref 26.0–34.0)
MCHC: 32.1 g/dL (ref 30.0–36.0)
MCV: 91.2 fL (ref 80.0–100.0)
Platelets: 138 10*3/uL — ABNORMAL LOW (ref 150–400)
RBC: 3.65 MIL/uL — ABNORMAL LOW (ref 4.22–5.81)
RDW: 14.9 % (ref 11.5–15.5)
WBC: 5.9 10*3/uL (ref 4.0–10.5)
nRBC: 0 % (ref 0.0–0.2)

## 2022-03-20 LAB — BASIC METABOLIC PANEL
Anion gap: 9 (ref 5–15)
BUN: 16 mg/dL (ref 8–23)
CO2: 26 mmol/L (ref 22–32)
Calcium: 9.6 mg/dL (ref 8.9–10.3)
Chloride: 103 mmol/L (ref 98–111)
Creatinine, Ser: 0.79 mg/dL (ref 0.61–1.24)
GFR, Estimated: >60 mL/min (ref >60)
Glucose, Bld: 99 mg/dL (ref 70–99)
Potassium: 3.7 mmol/L (ref 3.5–5.1)
Sodium: 138 mmol/L (ref 135–145)

## 2022-03-20 LAB — ECHOCARDIOGRAM COMPLETE
AR max vel: 1.91 cm2
AV Area VTI: 1.84 cm2
AV Area mean vel: 1.84 cm2
AV Mean grad: 9.5 mmHg
AV Peak grad: 21.2 mmHg
Ao pk vel: 2.3 m/s
Area-P 1/2: 3.97 cm2
Height: 72 in
MV VTI: 3.51 cm2
S' Lateral: 2.5 cm
Weight: 2400 oz

## 2022-03-20 LAB — VITAMIN B12: Vitamin B-12: 382 pg/mL (ref 180–914)

## 2022-03-20 LAB — HIV ANTIBODY (ROUTINE TESTING W REFLEX): HIV Screen 4th Generation wRfx: NONREACTIVE

## 2022-03-20 LAB — RPR: RPR Ser Ql: NONREACTIVE

## 2022-03-20 LAB — TSH: TSH: 0.646 u[IU]/mL (ref 0.350–4.500)

## 2022-03-20 MED ORDER — ENOXAPARIN SODIUM 40 MG/0.4ML IJ SOSY
40.0000 mg | PREFILLED_SYRINGE | Freq: Every day | INTRAMUSCULAR | Status: DC
  Administered 2022-03-20 – 2022-03-24 (×5): 40 mg via SUBCUTANEOUS
  Filled 2022-03-20 (×5): qty 0.4

## 2022-03-20 MED ORDER — DONEPEZIL HCL 5 MG PO TABS
10.0000 mg | ORAL_TABLET | Freq: Every day | ORAL | Status: DC
  Administered 2022-03-20 – 2022-03-24 (×5): 10 mg via ORAL
  Filled 2022-03-20 (×5): qty 2

## 2022-03-20 NOTE — Evaluation (Signed)
Physical Therapy Evaluation Patient Details Name: Bryan [name] MRN: [id number] DOB: December 15, 1940 Today's Date: 03/20/2022  History of Present Illness  Bryan [name] is a 81 y.o. 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 Pt [facility] for A[facility]. MRI unrevealing.  Clinical Impression  Pt admitted [name] above Dx. Pt shows functional limitations due to the deficits listed below (see "PT Problem List"). Patient agreeable to PT evaluation, is awake, alert, but still having expressive language difficulty- mild-moderate anomia with intermittent perseverative gibberish. PLOF and home setup obtained to some degree. Pt able to perform bed mobility and transfers near baseline, but feels weak and lightheaded after AMB 37f in room with RW. Pt has not had lunch, no drink in room (asking for water), author coordinates with RN prior to obtaining water. Pt denies difficulty swallowing, takes a few sips with straw without issue. Pt reports his mentation has not been at baseline, he has trouble understanding himself sometimes when words come out- he denies any trouble understanding author. Pt does not feel he can safely manage his ADL/IADL needs at home in his current condition, author agrees. Patient will benefit from skilled PT intervention to maximize independence and safety in mobility required for basic ADL performance at discharge.        Recommendations for follow up therapy are one component of a multi-disciplinary discharge planning process, led by the attending physician.  Recommendations may be updated based on patient status, additional functional criteria and insurance authorization.  Follow Up Recommendations Skilled nursing-short term rehab (<3  hours/day) Can patient physically be transported by private vehicle: No    Assistance Recommended at Discharge Intermittent Supervision/Assistance  Patient can return home with the following  A little help with walking and/or transfers;A little help with bathing/dressing/bathroom;Direct supervision/assist for medications management;Assist for transportation;Help with stairs or ramp for entrance;Assistance with cooking/housework    Equipment Recommendations None recommended by PT  Recommendations for Other Services       Functional Status Assessment Patient has had a recent decline in their functional status and demonstrates the ability to make significant improvements in function in a reasonable and predictable amount of time.     Precautions / Restrictions Precautions Precautions: Fall Restrictions Weight Bearing Restrictions: No      Mobility  Bed Mobility Overal bed mobility: Independent                  Transfers Overall transfer level: Independent Equipment used: Rolling walker (2 wheels)                    Ambulation/Gait Ambulation/Gait assistance: Min guard Gait Distance (Feet): 50 Feet Assistive device: Rolling walker (2 wheels)         General Gait Details: shows caution, safety awareness, expresses desire to sit when confidence changes, no LOB with turning or walking  Stairs            Wheelchair Mobility    Modified Rankin (Stroke Patients Only)       Balance                                             Pertinent Vitals/Pain Pain Assessment Pain Assessment: No/denies  pain    Home Living Family/patient expects to be discharged to:: Private residence Living Arrangements: Alone Available Help at Discharge: Family (sister lives nearby, assists) Type of Home:  (condo) Home Access: Level entry       Home Layout: One level        Prior Function               Mobility Comments: Pt uses SPC for  mobility, but somewha tlimited at baseline, reports to be a limited community AMB, still drives, grocery shops.       Hand Dominance   Dominant Hand: Right    Extremity/Trunk Assessment                Communication      Cognition Arousal/Alertness: Awake/alert Behavior During Therapy:  (expressive language impairment, acute x3 days) Overall Cognitive Status: Impaired/Different from baseline                                          General Comments      Exercises     Assessment/Plan    PT Assessment Patient needs continued PT services  PT Problem List Decreased strength;Decreased cognition;Decreased activity tolerance;Decreased mobility       PT Treatment Interventions DME instruction;Balance training;Gait training;Stair training;Functional mobility training;Therapeutic activities;Therapeutic exercise;Patient/family education    PT Goals (Current goals can be found in the Care Plan section)  Acute Rehab PT Goals Patient Stated Goal: regain ability to live alone and perform his ADL at baseline PT Goal Formulation: With patient Time For Goal Achievement: 04/03/22 Potential to Achieve Goals: Fair    Frequency Min 2X/week     Co-evaluation               AM-PAC PT "6 Clicks" Mobility  Outcome Measure Help needed turning from your back to your side while in a flat bed without using bedrails?: A Little Help needed moving from lying on your back to sitting on the side of a flat bed without using bedrails?: A Little Help needed moving to and from a bed to a chair (including a wheelchair)?: A Little Help needed standing up from a chair using your arms (e.g., wheelchair or bedside chair)?: A Little Help needed to walk in hospital room?: A Little Help needed climbing 3-5 steps with a railing? : A Lot 6 Click Score: 17    End of Session Equipment Utilized During Treatment: Gait belt Activity Tolerance: No increased pain;Patient limited by  fatigue Patient left: in bed;with call bell/phone within reach;with nursing/sitter in room;with bed alarm set Nurse Communication: Mobility status PT Visit Diagnosis: Other abnormalities of gait and mobility (R26.89);Difficulty in walking, not elsewhere classified (R26.2);Other symptoms and signs involving the nervous system (R29.898)    Time: 1340-1401 PT Time Calculation (min) (ACUTE ONLY): 21 min   Charges:   PT Evaluation $PT Eval Low Complexity: 1 Low     2:37 PM, 03/20/22 Etta Grandchild, PT, DPT Physical Therapist - [facility]  [phone] ([place])     Bryan [name] 03/20/2022, 2:26 PM

## 2022-03-20 NOTE — NC FL2 (Signed)
[place] LEVEL OF CARE SCREENING TOOL     IDENTIFICATION  Patient Name: Bryan [name] Birthdate: 1940-06-19 Sex: male Admission Date (Current Location): 03/19/2022  [facility] and [place] Number:  Engineering geologist and Address:  [facility], [address], [place], [place] [address]      Provider Number: [id number]  Attending Physician Name and Address:  Jennye Boroughs, MD  Relative Name and Phone Number:  Irma Newness [phone]    Current Level of Care: Hospital Recommended Level of Care: [place] Prior Approval Number:    Date Approved/Denied:   PASRR Number: [id number]  Discharge Plan: SNF    Current Diagnoses: Patient Active Problem List   Diagnosis Date Noted   Acute encephalopathy 03/19/2022   Leukocytosis 03/19/2022   Open scalp wound 02/02/2022   Senile dementia uncomp, without behavioral disturbance ([place]) 01/11/2022   Scalp hematoma 01/10/2022   Acute blood loss anemia 01/10/2022   Coronary artery disease    Chronic kidney disease (CKD), stage II (mild)    Chronic diastolic CHF (congestive heart failure) ([place])    Depression with anxiety    Hematoma 12/31/2021   Rhinorrhea 12/31/2021   Acute bilateral back pain 12/21/2021   Fall 12/15/2021   Complex laceration of right ear 12/15/2021   Depression    Hyperkalemia    Status post knee replacement 09/29/2021   Leg wound, right, initial encounter 08/21/2021   Protein-calorie malnutrition ([place]) 08/22/2020   Right knee pain 05/26/2020   Basal cell carcinoma 05/26/2020   Skin lesion 02/22/2020   Acute on chronic congestive heart failure ([place]) 01/21/2020   Neuropathy 11/21/2019   Acute heart failure with preserved ejection fraction (HFpEF) ([place])    Pulmonary HTN ([place])    Painful orthopaedic hardware ([place]) 11/02/2019   Osteoporosis 01/26/2019   Right inguinal hernia 04/18/2018   Zinc deficiency 01/27/2018   Abnormal CT scan of lung  12/06/2017   Low back pain 12/05/2017   Anemia    AKI (acute kidney injury) ([place])    Pseudoarthrosis of lumbar spine 09/28/2017   Anxiety 06/24/2017   Lumbar burst fracture, closed, initial encounter ([place]) 06/08/2017   Urge incontinence 12/15/2016   Insomnia 11/09/2016   Nausea and vomiting 10/15/2016   Spinal stenosis of lumbar region 10/14/2016   Leukopenia 09/27/2016   Thrombocytopenia ([place]) 09/27/2016   Pancytopenia ([place]) 09/27/2016   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   HNP (herniated nucleus pulposus with myelopathy), thoracic 03/08/2016   Rectal nodule 02/23/2016   DDD (degenerative disc disease), lumbar 01/02/2016   Lumbar radiculopathy 01/02/2016   Facet syndrome, lumbar 01/02/2016   IBS (irritable bowel syndrome) 07/15/2015   Erectile dysfunction of organic origin 07/15/2015   Carotid stenosis 05/06/2015   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   Chronic kidney disease 02/17/2015   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   Enlarged prostate with lower urinary tract symptoms (LUTS) 02/17/2015   Rheumatoid arthritis ([place]) 10/01/2014   Sacroiliac joint disease 10/01/2014   Fusion of spine, sacral and sacrococcygeal region 10/01/2014   DDD (degenerative disc disease), cervical 10/01/2014   Iron deficiency anemia 06/19/2014   Aneurysm, ascending aorta ([place]) 05/27/2014   Aneurysm of thoracic aorta ([place]) 05/27/2014   Thoracic aortic aneurysm without rupture ([place]) 05/27/2014   Cognitive decline 04/01/2014   Spinal stenosis of lumbar region with radiculopathy 01/30/2014   Vitamin D deficiency 01/30/2014   Lumbar canal stenosis 01/30/2014   Paroxysmal atrial fibrillation ([place]) 11/27/2013   Hyperlipidemia  11/27/2013   Essential hypertension 11/27/2013   Bicuspid aortic valve 11/27/2013   Atherosclerotic heart disease of native coronary artery without angina pectoris 11/27/2013   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    Carotid artery insufficiency syndrome 11/06/2010    Orientation RESPIRATION BLADDER Height & Weight     Self, Place  Normal External catheter Weight: 150 lb (68 kg) Height:  6' (182.9 cm)  BEHAVIORAL SYMPTOMS/MOOD NEUROLOGICAL BOWEL NUTRITION STATUS      Continent Diet (See dc summary)  AMBULATORY STATUS COMMUNICATION OF NEEDS Skin   Limited Assist Verbally Normal                       Personal Care Assistance Level of Assistance  Bathing, Feeding, Dressing Bathing Assistance: Limited assistance Feeding assistance: Independent Dressing Assistance: Limited assistance     Functional Limitations Info  Sight, Hearing, Speech Sight Info: Adequate Hearing Info: Adequate Speech Info: Adequate    SPECIAL CARE FACTORS FREQUENCY  PT (By licensed PT), OT (By licensed OT)     PT Frequency: 3x week OT Frequency: 3x week            Contractures Contractures Info: Not present    Additional Factors Info  Code Status, Allergies, Psychotropic Code Status Info: DNR Allergies Info: Penicillins   Demerol (Meperidine) Psychotropic Info: FLUoxetine (PROZAC) capsule 20 mg daily         Current Medications (03/20/2022):  This is the current hospital active medication list Current Facility-Administered Medications  Medication Dose Route Frequency Provider Last Rate Last Admin   acetaminophen (TYLENOL) tablet 650 mg  650 mg Oral Q6H PRN Jose Persia, MD       Or   acetaminophen (TYLENOL) suppository 650 mg  650 mg Rectal Q6H PRN Jose Persia, MD       amLODipine (NORVASC) tablet 5 mg  5 mg Oral Daily Jose Persia, MD   5 mg at 03/20/22 0854   atorvastatin (LIPITOR) tablet 40 mg  40 mg Oral Daily Jose Persia, MD   40 mg at 03/20/22 0910   donepezil (ARICEPT) tablet 10 mg  10 mg Oral QHS Jennye Boroughs, MD       enoxaparin (LOVENOX) injection 40 mg  40 mg Subcutaneous QHS Jennye Boroughs, MD       ferrous sulfate tablet 325 mg  325 mg Oral Q breakfast Jose Persia, MD   325 mg at 03/20/22 0853   FLUoxetine (PROZAC) capsule 20 mg  20 mg Oral Daily Jose Persia, MD   20 mg at 15/17/61 6073   folic acid (FOLVITE) tablet 1 mg  1 mg Oral Daily Jose Persia, MD   1 mg at 03/20/22 0854   HYDROcodone-acetaminophen (NORCO) 10-325 MG per tablet 1 tablet  1 tablet Oral QID PRN Jose Persia, MD       ipratropium (ATROVENT) 0.06 % nasal spray 2 spray  2 spray Each Nare QID Jose Persia, MD   2 spray at 03/20/22 1402   lisinopril (ZESTRIL) tablet 30 mg  30 mg Oral Daily Jose Persia, MD   30 mg at 03/20/22 0854   loratadine (CLARITIN) tablet 10 mg  10 mg Oral Daily Jose Persia, MD   10 mg at 03/20/22 0854   multivitamin with minerals tablet 1 tablet  1 tablet Oral Daily Jose Persia, MD   1 tablet at 03/20/22 0853   pantoprazole (PROTONIX) EC tablet 40 mg  40 mg Oral Daily Jose Persia, MD   40 mg  at 03/20/22 0854   polyethylene glycol (MIRALAX / GLYCOLAX) packet 17 g  17 g Oral Daily PRN Jose Persia, MD       sodium chloride flush (NS) 0.9 % injection 3 mL  3 mL Intravenous Q12H Jose Persia, MD   3 mL at 03/20/22 0857   thiamine (VITAMIN B1) tablet 100 mg  100 mg Oral Daily Jose Persia, MD   100 mg at 03/20/22 3343   Facility-Administered Medications Ordered in Other Encounters  Medication Dose Route Frequency Provider Last Rate Last Admin   0.9 %  sodium chloride infusion   Intravenous Continuous Lequita Asal, MD 10 mL/hr at 12/11/19 1123 New Bag at 12/11/19 1123     Discharge Medications: Please see discharge summary for a list of discharge medications.  Relevant Imaging Results:  Relevant Lab Results:   Additional Information [id number]  Loreta Ave, LCSWA

## 2022-03-20 NOTE — Evaluation (Signed)
Clinical/Bedside Swallow Evaluation Patient Details  Name: Bryan [name] MRN: [id number] Date of Birth: 06/20/1940  Today's Date: 03/20/2022 Time: SLP Start Time (ACUTE ONLY): 1645 SLP Stop Time (ACUTE ONLY): 4034 SLP Time Calculation (min) (ACUTE ONLY): 20 min  Past Medical History:  Past Medical History:  Diagnosis Date   Abnormal CT scan    Asymmetric left rectal wall thickening    Anemia    Anxiety    Arrhythmia    Basal cell carcinoma 04/28/2020   L forehead - ED&C 06/17/2020   Basal cell carcinoma 05/06/2021   left scalp postauricular ant, EDC   Basal cell carcinoma 05/06/2021   left scalp postauricular post, EDC   CHF (congestive heart failure) (HCC)    Chronic lower back pain    Complication of anesthesia    Memory loss 09/2015   Coronary artery disease    Depression    GERD (gastroesophageal reflux disease)    Glaucoma    H/O thoracic aortic aneurysm repair    Headache    Heart murmur    History of being hospitalized    memory lose kidney funtion down blood pressure up   History of blood transfusion    "think he had one when he had heart valve OR" (10/14/2016); "none since" (10/19/2017)   History of chicken pox    Hyperlipidemia    Hypertension    Lumbar stenosis    Murmur    Neuropathy    Osteoarthritis    worse in feet and ankles   Paroxysmal A-fib ([place])    Poor short term memory    takes Aricept   Rheumatoid arthritis ([place])    "all over" (10/14/2016)   Schizophrenia ([place])    Seasonal allergies    Thoracic spine pain    Valvular heart disease    Wears glasses    reading   Past Surgical History:  Past Surgical History:  Procedure Laterality Date   AORTIC VALVE REPLACEMENT  2007   [facility].  Supply, [place]; "pig valve"   APPENDECTOMY  2010   BACK SURGERY     CARDIAC VALVE REPLACEMENT     CATARACT EXTRACTION W/ INTRAOCULAR LENS  IMPLANT, BILATERAL Bilateral 2012   COLONOSCOPY WITH PROPOFOL N/A 09/24/2016   Procedure: COLONOSCOPY WITH  PROPOFOL;  Surgeon: Jonathon Bellows, MD;  Location: [facility] ENDOSCOPY;  Service: Endoscopy;  Laterality: N/A;   ESOPHAGOGASTRODUODENOSCOPY (EGD) WITH PROPOFOL N/A 09/24/2016   Procedure: ESOPHAGOGASTRODUODENOSCOPY (EGD) WITH PROPOFOL;  Surgeon: Jonathon Bellows, MD;  Location: [facility] ENDOSCOPY;  Service: Endoscopy;  Laterality: N/A;   GIVENS CAPSULE STUDY N/A 09/24/2016   Procedure: GIVENS CAPSULE STUDY;  Surgeon: Jonathon Bellows, MD;  Location: [facility] ENDOSCOPY;  Service: Endoscopy;  Laterality: N/A;   HAMMER TOE SURGERY Right 10/09/2015   Procedure: HAMMER TOE REPAIR WITH K-WIRE FIXATION RIGHT SECOND TOE;  Surgeon: Albertine Patricia, DPM;  Location: [place];  Service: Podiatry;  Laterality: Right;  WITH LOCAL   HARDWARE REMOVAL N/A 11/02/2019   Procedure: Removal of hardware thoracic spine;  Surgeon: Kary Kos, MD;  Location: [place];  Service: Neurosurgery;  Laterality: N/A;  posterior   INGUINAL HERNIA REPAIR Right 05/05/2018   Medium Bard PerFix plug.  Surgeon: Robert Bellow, MD;  Location: [facility] ORS;  Service: General;  Laterality: Right;   IR VERTEBROPLASTY CERV/THOR BX INC UNI/BIL INC/INJECT/IMAGING  10/27/2018   JOINT REPLACEMENT Left    left total knee   LAMINECTOMY WITH POSTERIOR LATERAL ARTHRODESIS LEVEL 3 N/A 09/28/2017   Procedure: LUMBAR  POSTEROR  FUSION REVISION - LUMBAR ONE -TWO, LUMBAR TWO -THREE,THREE-FOUR ,BILATERAL ARTHRODESIS REMOVAL LUMBAR THREE HARDWARE;  Surgeon: Kary Kos, MD;  Location: [place];  Service: Neurosurgery;  Laterality: N/A;   LAMINECTOMY WITH POSTERIOR LATERAL ARTHRODESIS LEVEL 3 N/A 10/20/2017   Procedure: Revision fusion and removal of hardware Lumbar one, Pedicle screw fixation thoracic ten-lumbar two, thoracic nine-ten laminotomy, Posterior Lumbar Arthrodesis thoracic ten-lumbar two ;  Surgeon: Kary Kos, MD;  Location: [place];  Service: Neurosurgery;  Laterality: N/A;   LAPAROSCOPIC CHOLECYSTECTOMY  2010   LUMBAR FUSION Right 06/08/2017   LUMBAR THREE-FOUR,  LUMBAR FOUR-FIVE POSTEROLATERAL ARTHRODESIS WITH RIGHT LUMBAR FOUR-FIVE LAMINECTOMY/FORAMINOTOMY   LUMBAR LAMINECTOMY/DECOMPRESSION MICRODISCECTOMY Right 03/08/2016   Procedure: Laminectomy and Foraminotomy - Lumbar Five-Sacral One Right;  Surgeon: Kary Kos, MD;  Location: [place];  Service: Neurosurgery;  Laterality: Right;  Right   MULTIPLE TOOTH EXTRACTIONS     "3"   OTOPLASATY Right 12/15/2021   Procedure: Right auricle repair;  Surgeon: Margaretha Sheffield, MD;  Location: [facility] ORS;  Service: ENT;  Laterality: Right;   POSTERIOR LUMBAR FUSION  10/14/2016   L5-S1   REPLACEMENT TOTAL KNEE Left 2003   THORACIC AORTIC ANEURYSM REPAIR  2010   TOTAL KNEE ARTHROPLASTY Right 08/2021   HPI:  Bryan [name] is a 81 y.o. 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 Pt currently admitted for AMS . Per neurology note, "Regarding his dementia history, daughter reports that he has had memory issues for at least 5 years with a steady decline.  In last 2 months he has been having more personality changes with irritability and lashing out at church friends etc.  She notes that he has had significant cognitive declines with each time he is needed general anesthesia.  He has had frequent falls but to the best of my ability to determine these appear to be mechanical and given his arthritis and prior back surgeries he is at high risk of mechanical falls." Neurology further reporting that, "drug ingestion versus seizure with postictal Todd's phenomenon are highest on the differential," for source of AMS     MRI 03/19/22: No acute intracranial abnormality. Moderate chronic microvascular ischemic changes of the white  matter. Remote small infarcts in the bilateral cerebellar  hemispheres. Chest X-Ray 03/19/22: Faint peripheral left mid lung opacities, could be scarring or developing infection. Recommend short-term radiographic follow-up.    Assessment / Plan / Recommendation  Clinical Impression  Pt seen for clinical swallow assessment. Pt seen with trials of thin liquids and regular solids (consistent with current diet). Pt demonstrated overtly function oropharyngeal swallow ability. No overt or subtle s/sx pharyngeal dysphagia noted. No change to vocal quality across trials. Oral phase complete and timely. Pt denied concern for PO intake. Recommend continued regular solids and thin liquids with general aspiration precautions (slow rate, small bites, elevated HOB, and alert for PO intake).       Cognitive linguistic ability also screened during session, with pt demonstrating expressive language challenges c/b neologisms, phonemic paraphasias, and anomic hesitation. Pt oriented to time, self and location independently. Pt followed commands and answered questions appropriately. Attention functional for duration of questioning. Based on report at admission and per RN report, cognition has greatly improved throughout admission. Based on trajectory of mentation, suspect that cognition will continue to progress with time  and stabilization. Recommend follow up cognitive assessment in next level of care vs during acute stay depending on LOS in acute care. MD/RN aware of recommendations. SLP Visit Diagnosis: Dysphagia, unspecified (R13.10)    Aspiration Risk  Mild aspiration risk    Diet Recommendation   Regular solids and thin liquids  Medication Administration: Whole meds with liquid    Other  Recommendations Oral Care Recommendations: Oral care BID    Recommendations for follow up therapy are one component of a multi-disciplinary discharge planning process, led by the attending physician.  Recommendations may be updated based on patient status, additional functional criteria  and insurance authorization.  Follow up Recommendations Skilled nursing-short term rehab (<3 hours/day)      Assistance Recommended at Discharge Intermittent Supervision/Assistance    Swallow Study   General Date of Onset: 03/20/22 HPI: Bryan [name] is a 81 y.o. 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 Pt currently admitted for AMS . Per neurology note, "Regarding his dementia history, daughter reports that he has had memory issues for at least 5 years with a steady decline.  In last 2 months he has been having more personality changes with irritability and lashing out at church friends etc.  She notes that he has had significant cognitive declines with each time he is needed general anesthesia.  He has had frequent falls but to the best of my ability to determine these appear to be mechanical and given his arthritis and prior back surgeries he is at high risk of mechanical falls." Neurology further reporting that, "drug ingestion versus seizure with postictal Todd's phenomenon are highest on the differential," for source of AMS     MRI 03/19/22: No acute intracranial abnormality. Moderate chronic microvascular ischemic changes of the white  matter. 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 Chest X-Ray 03/19/22: Faint peripheral left mid lung opacities, could be scarring or developing infection. Recommend short-term radiographic follow-up. Type of Study: Bedside Swallow Evaluation Previous Swallow Assessment: none in chart Diet Prior to this Study: Regular;Thin liquids Temperature Spikes Noted: No Respiratory Status: Room air History of Recent Intubation: No Behavior/Cognition: Alert;Cooperative;Pleasant mood Oral Cavity  Assessment: Within Functional Limits Oral Care Completed by SLP: Recent completion by staff Oral Cavity - Dentition: Adequate natural dentition Vision: Functional for self-feeding Self-Feeding Abilities: Able to feed self Patient Positioning: Upright in bed Baseline Vocal Quality: Normal Volitional Cough: Strong    Oral/Motor/Sensory Function Overall Oral Motor/Sensory Function: Within functional limits   Ice Chips Ice chips: Not tested   Thin Liquid Thin Liquid: Within functional limits    Nectar Thick Nectar Thick Liquid: Not tested   Honey Thick Honey Thick Liquid: Not tested   Puree Puree: Not tested   Solid     Solid: Within functional limits     Bryan Bryan Stawicki Clapp  MS CCC SLP   Bryan [name] 03/20/2022,7:13 PM

## 2022-03-20 NOTE — Progress Notes (Signed)
*  PRELIMINARY RESULTS* Echocardiogram 2D Echocardiogram has been performed.  Bryan [name] 03/20/2022, 11:07 AM

## 2022-03-20 NOTE — TOC Initial Note (Signed)
Transition of Care [facility]) - Initial/Assessment Note    Patient Details  Name: Bryan [name] MRN: [id number] Date of Birth: Jul 19, 1940  [facility] CM/SW Contact:    [address], [place] Phone Number: 03/20/2022, 3:59 PM  Clinical Narrative:                 CSW spoke with pt's [name], explained consult for SNF, Baker Janus is agreeable, request placement in [place] area. Baker Janus is agreeable to [facility] as it is near the [place] area. CSW explained insurance auth process and Medicare.gov site for review, Baker Janus is appreciative.         Patient Goals and CMS Choice        Expected Discharge Plan and Services                                                Prior Living Arrangements/Services                       Activities of Daily Living Home Assistive Devices/Equipment: Environmental consultant (specify type), Cane (specify quad or straight) ADL Screening (condition at time of admission) Patient's cognitive ability adequate to safely complete daily activities?: No Is the patient deaf or have difficulty hearing?: Yes Does the patient have difficulty seeing, even when wearing glasses/contacts?: Yes Does the patient have difficulty concentrating, remembering, or making decisions?: Yes Patient able to express need for assistance with ADLs?: No Does the patient have difficulty dressing or bathing?: Yes Independently performs ADLs?: No (Change of status today) Dressing (OT): Needs assistance Is this a change from baseline?: Change from baseline, expected to last <3days Grooming: Needs assistance Is this a change from baseline?: Change from baseline, expected to last <3 days Feeding: Needs assistance Is this a change from baseline?: Change from baseline, expected to last <3 days Bathing: Needs assistance Is this a change from baseline?: Change from baseline, expected to last <3 days Toileting: Needs assistance Is this a change from baseline?:  Change from baseline, expected to last <3 days In/Out Bed: Needs assistance Is this a change from baseline?: Change from baseline, expected to last <3 days Walks in Home: Independent with device (comment) (Cane/Walker) Does the patient have difficulty walking or climbing stairs?: Yes Weakness of Legs: Both Weakness of Arms/Hands: None  Permission Sought/Granted                  Emotional Assessment              Admission diagnosis:  Acute encephalopathy [G93.40] Altered mental status, unspecified altered mental status type [R41.82] Patient Active Problem List   Diagnosis Date Noted   Acute encephalopathy 03/19/2022   Leukocytosis 03/19/2022   Open scalp wound 02/02/2022   Senile dementia uncomp, without behavioral disturbance ([place]) 01/11/2022   Scalp hematoma 01/10/2022   Acute blood loss anemia 01/10/2022   Coronary artery disease    Chronic kidney disease (CKD), stage II (mild)    Chronic diastolic CHF (congestive heart failure) ([place])    Depression with anxiety    Hematoma 12/31/2021   Rhinorrhea 12/31/2021   Acute bilateral back pain 12/21/2021   Fall 12/15/2021   Complex laceration of right ear 12/15/2021   Depression    Hyperkalemia    Status post knee replacement 09/29/2021   Leg wound, right, initial encounter 08/21/2021  Protein-calorie malnutrition ([place]) 08/22/2020   Right knee pain 05/26/2020   Basal cell carcinoma 05/26/2020   Skin lesion 02/22/2020   Acute on chronic congestive heart failure ([place]) 01/21/2020   Neuropathy 11/21/2019   Acute heart failure with preserved ejection fraction (HFpEF) ([place])    Pulmonary HTN ([place])    Painful orthopaedic hardware ([place]) 11/02/2019   Osteoporosis 01/26/2019   Right inguinal hernia 04/18/2018   Zinc deficiency 01/27/2018   Abnormal CT scan of lung 12/06/2017   Low back pain 12/05/2017   Anemia    AKI (acute kidney injury) ([place])    Pseudoarthrosis of lumbar spine 09/28/2017   Anxiety 06/24/2017   Lumbar  burst fracture, closed, initial encounter ([place]) 06/08/2017   Urge incontinence 12/15/2016   Insomnia 11/09/2016   Nausea and vomiting 10/15/2016   Spinal stenosis of lumbar region 10/14/2016   Leukopenia 09/27/2016   Thrombocytopenia ([place]) 09/27/2016   Pancytopenia ([place]) 09/27/2016   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   HNP (herniated nucleus pulposus with myelopathy), thoracic 03/08/2016   Rectal nodule 02/23/2016   DDD (degenerative disc disease), lumbar 01/02/2016   Lumbar radiculopathy 01/02/2016   Facet syndrome, lumbar 01/02/2016   IBS (irritable bowel syndrome) 07/15/2015   Erectile dysfunction of organic origin 07/15/2015   Carotid stenosis 05/06/2015   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   Chronic kidney disease 02/17/2015   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   Enlarged prostate with lower urinary tract symptoms (LUTS) 02/17/2015   Rheumatoid arthritis ([place]) 10/01/2014   Sacroiliac joint disease 10/01/2014   Fusion of spine, sacral and sacrococcygeal region 10/01/2014   DDD (degenerative disc disease), cervical 10/01/2014   Iron deficiency anemia 06/19/2014   Aneurysm, ascending aorta ([place]) 05/27/2014   Aneurysm of thoracic aorta ([place]) 05/27/2014   Thoracic aortic aneurysm without rupture ([place]) 05/27/2014   Cognitive decline 04/01/2014   Spinal stenosis of lumbar region with radiculopathy 01/30/2014   Vitamin D deficiency 01/30/2014   Lumbar canal stenosis 01/30/2014   Paroxysmal atrial fibrillation ([place]) 11/27/2013   Hyperlipidemia 11/27/2013   Essential hypertension 11/27/2013   Bicuspid aortic valve 11/27/2013   Atherosclerotic heart disease of native coronary artery without angina pectoris 11/27/2013   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

## 2022-03-20 NOTE — Evaluation (Signed)
Occupational Therapy Evaluation Patient Details Name: Bryan [name] MRN: [id number] DOB: 01-May-1941 Today's Date: 03/20/2022   History of Present Illness Bryan [name] is a 81 y.o. 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 Pt comes to [facility] for AMS. MRI unrevealing.   Clinical Impression   Patient seen for OT evaluation. Pt presenting with decreased independence in self care, balance, functional mobility/transfers, endurance, and safety awareness. At baseline, pt is independent for ADLs, Mod I for IADLs, and Mod I for functional mobility using a SPC. Pt lives alone, has sister nearby who assists with IADLs PRN. Pt currently functioning at supervision for bed mobility, Min guard for functional mobility to the bathroom using RW, supervision for peri care in sitting, and Min guard for standing grooming tasks. Pt reports his speech and mentation is not baseline. A couple instances of mild expressive difficulties, however, pt reports improving. Pt will benefit from acute OT to increase overall independence in the areas of ADLs, functional mobility in order to safely discharge to next venue of care. Upon hospital discharge, recommend STR to maximize pt safety and return to PLOF.       Recommendations for follow up therapy are one component of a multi-disciplinary discharge planning process, led by the attending physician.  Recommendations may be updated based on patient status, additional functional criteria and insurance authorization.   Follow Up Recommendations  Skilled nursing-short term rehab (<3 hours/day)    Assistance Recommended at Discharge Frequent or constant Supervision/Assistance  Patient can return home with the following A little help  with walking and/or transfers;Assistance with cooking/housework;Assist for transportation;Help with stairs or ramp for entrance;Direct supervision/assist for medications management;Direct supervision/assist for financial management;A lot of help with bathing/dressing/bathroom    Functional Status Assessment  Patient has had a recent decline in their functional status and demonstrates the ability to make significant improvements in function in a reasonable and predictable amount of time.  Equipment Recommendations  Other (comment) (defer to next venue of care)    Recommendations for Other Services       Precautions / Restrictions Precautions Precautions: Fall Restrictions Weight Bearing Restrictions: No      Mobility Bed Mobility Overal bed mobility: Needs Assistance Bed Mobility: Supine to Sit, Sit to Supine     Supine to sit: Supervision, HOB elevated Sit to supine: Supervision        Transfers Overall transfer level: Needs assistance Equipment used: Rolling walker (2 wheels) Transfers: Sit to/from Stand Sit to Stand: Min guard, Supervision           General transfer comment: STS from EOB with supervision, STS from toilet with Min guard using grab bar      Balance Overall balance assessment: Needs assistance Sitting-balance support: Feet supported Sitting balance-Leahy Scale: Good     Standing balance support: Bilateral upper extremity supported Standing balance-Leahy Scale: Fair                             ADL either performed or assessed with clinical judgement   ADL Overall ADL's : Needs assistance/impaired Eating/Feeding: Set up;Bed level   Grooming: Wash/dry hands;Standing;Min guard                   Toilet Transfer: Min guard;Regular Toilet;Grab bars;Rolling walker (  2 wheels)   Toileting- Clothing Manipulation and Hygiene: Supervision/safety;Sitting/lateral lean Toileting - Clothing Manipulation Details (indicate cue type and  reason): peri care in sitting     Functional mobility during ADLs: Min guard;Rolling walker (2 wheels) (to the bathroom)       Vision Baseline Vision/History: 1 Wears glasses Patient Visual Report: No change from baseline       Perception     Praxis      Pertinent Vitals/Pain Pain Assessment Pain Assessment: No/denies pain     Hand Dominance Right   Extremity/Trunk Assessment Upper Extremity Assessment Upper Extremity Assessment: Generalized weakness   Lower Extremity Assessment Lower Extremity Assessment: Generalized weakness       Communication Communication Communication: Expressive difficulties (a couple instances of mild expressive difficulties however non-limiting & pt reports improving, stated "I've had trouble getting my words out at times")   Cognition Arousal/Alertness: Awake/alert Behavior During Therapy: WFL for tasks assessed/performed Overall Cognitive Status: No family/caregiver present to determine baseline cognitive functioning                                 General Comments: Able to follow all commands well and answer questions appropriately     General Comments       Exercises Other Exercises Other Exercises: OT provided education re: role of OT, OT POC, post acute recs, sitting up for all meals, EOB/OOB mobility with assistance, home/fall safety.    Shoulder Instructions      Home Living Family/patient expects to be discharged to:: Private residence Living Arrangements: Alone Available Help at Discharge: Family;Available PRN/intermittently (sister lives nearby) Type of Home:  (condo) Home Access: Level entry     Home Layout: One level     Bathroom Shower/Tub: Occupational psychologist: Handicapped height     Home Equipment: Shower seat - built in;Grab bars - tub/shower          Prior Functioning/Environment Prior Level of Function : Independent/Modified Independent;Driving             Mobility  Comments: Pt uses SPC for mobility, denies history of falls ADLs Comments: Pt reports independent for ADLs, sister assists with some IADLs (delivers groceries). Was driving but does not currently have car.        OT Problem List: Decreased strength;Decreased activity tolerance;Impaired balance (sitting and/or standing);Decreased knowledge of use of DME or AE;Decreased cognition;Decreased safety awareness      OT Treatment/Interventions: Self-care/ADL training;Patient/family education;Therapeutic exercise;Balance training;Energy conservation;Therapeutic activities;Cognitive remediation/compensation;DME and/or AE instruction    OT Goals(Current goals can be found in the care plan section) Acute Rehab OT Goals Patient Stated Goal: get stronger OT Goal Formulation: With patient Time For Goal Achievement: 04/03/22 Potential to Achieve Goals: Good ADL Goals Pt Will Perform Grooming: with supervision;standing Pt Will Perform Lower Body Dressing: with supervision;sit to/from stand Pt Will Transfer to Toilet: with supervision;ambulating;regular height toilet;grab bars Pt Will Perform Toileting - Clothing Manipulation and hygiene: with supervision;sit to/from stand  OT Frequency: Min 2X/week    Co-evaluation              AM-PAC OT "6 Clicks" Daily Activity     Outcome Measure Help from another person eating meals?: A Little (set up) Help from another person taking care of personal grooming?: A Little Help from another person toileting, which includes using toliet, bedpan, or urinal?: A Little Help from another person bathing (including washing, rinsing, drying)?:  A Lot Help from another person to put on and taking off regular upper body clothing?: A Little Help from another person to put on and taking off regular lower body clothing?: A Little 6 Click Score: 17   End of Session Equipment Utilized During Treatment: Gait belt;Rolling walker (2 wheels) Nurse Communication: Mobility  status  Activity Tolerance: Patient tolerated treatment well Patient left: in bed;with call bell/phone within reach;with bed alarm set  OT Visit Diagnosis: Unsteadiness on feet (R26.81);Muscle weakness (generalized) (M62.81);Other symptoms and signs involving cognitive function                Time: 1725-1738 OT Time Calculation (min): 13 min Charges:  OT General Charges $OT Visit: 1 Visit OT Evaluation $OT Eval Low Complexity: 1 Low  [facility] MS, OTR/L ascom [phone]  03/20/22, 6:15 PM

## 2022-03-20 NOTE — Progress Notes (Addendum)
Progress Note    Bryan [name]  DPO:[id number] DOB: 07/01/1940  DOA: 03/19/2022 PCP: [name], MD      Brief Narrative:    Medical records reviewed and are as summarized below:  Bryan [name] is a 81 y.o. male with medical history significant for dementia, atrial fibrillation on Eliquis, chronic diastolic CHF, rheumatoid arthritis, hypertension, hyperlipidemia, who presented to the hospital with change in mental status different from his baseline.  Bryan [name], his stepdaughter, had gone to check on him and she noticed that patient was very confused.  She also found a spilled bottle of gabapentin near him.  Family was concerned that patient had been taking opioids and gabapentin together.  He was admitted to the hospital for probable acute toxic metabolic encephalopathy.  MRI brain did not show any evidence of acute intracranial abnormality.         Assessment/Plan:   Principal Problem:   Acute encephalopathy Active Problems:   Chronic diastolic CHF (congestive heart failure) (HCC)   Leukocytosis   Essential hypertension   Rheumatoid arthritis (HCC)   Paroxysmal atrial fibrillation (HCC)   Senile dementia uncomp, without behavioral disturbance (HCC)    Body mass index is 20.34 kg/m.  Probable acute toxic metabolic encephalopathy:? Gabapentin.  Appreciate recommendations from neurologist.  Consider routine EEG on Monday.  Continue supportive care.  No evidence of acute stroke on MRI brain.  Leukocytosis: Improved  Paroxysmal atrial fibrillation/atrial flutter: He is no longer on Eliquis because of falls and recent scalp hematoma in August 2023.  Dementia, recurrent falls: Continue Aricept.  Bryan [name], stepdaughter, is concerned that patient is no longer able to care for himself at home.  Consult PT and OT  Chronic diastolic CHF: Compensated  Other comorbidities include hypertension, rheumatoid arthritis  Diet Order             Diet Heart Room  service appropriate? Yes; Fluid consistency: Thin  Diet effective now                            Consultants: Neurologist  Procedures: None    Medications:    amLODipine  5 mg Oral Daily   atorvastatin  40 mg Oral Daily   donepezil  10 mg Oral QHS   enoxaparin (LOVENOX) injection  40 mg Subcutaneous QHS   ferrous sulfate  325 mg Oral Q breakfast   FLUoxetine  20 mg Oral Daily   folic acid  1 mg Oral Daily   ipratropium  2 spray Each Nare QID   lisinopril  30 mg Oral Daily   loratadine  10 mg Oral Daily   multivitamin with minerals  1 tablet Oral Daily   pantoprazole  40 mg Oral Daily   sodium chloride flush  3 mL Intravenous Q12H   thiamine  100 mg Oral Daily   Continuous Infusions:   Anti-infectives (From admission, onward)    None              Family Communication/Anticipated D/C date and plan/Code Status   DVT prophylaxis: enoxaparin (LOVENOX) injection 40 mg Start: 03/20/22 2200     Code Status: DNR  Family Communication: Plan discussed with Bryan [name], stepdaughter, over the phone Disposition Plan: May need SNF at discharge   Status is: Observation The patient will require care spanning > 2 midnights and should be moved to inpatient because: Confusion, unsafe discharge plan       Subjective:  Interval events noted.  He said he feels fine and has no complaints.  Of note, he is confused and cannot provide an adequate history.  He does not remember what transpired in the last 24 hours. Bryan Mention, RN, was at the bedside  Objective:    Vitals:   03/19/22 1930 03/19/22 2019 03/20/22 0039 03/20/22 1203  BP: (!) 166/82 (!) 158/71 (!) 174/85 (!) 142/80  Pulse: 69 80 66 81  Resp: 20 (!) '22 18 16  '$ Temp: 98 F (36.7 C) (!) 97.5 F (36.4 C) 97.7 F (36.5 C) 98.7 F (37.1 C)  TempSrc: Oral  Oral   SpO2: 99% 100% 100% 100%  Weight:      Height:       No data found.  No intake or output data in the 24 hours ending 03/20/22  1435 Filed Weights   03/19/22 1327  Weight: 68 kg    Exam:  GEN: NAD SKIN: Warm and dry EYES: No pallor or icterus ENT: MMM CV: RRR PULM: CTA B ABD: soft, ND, NT, +BS CNS: AAO x 1 (person), non focal EXT: No edema or tenderness        Data Reviewed:   I have personally reviewed following labs and imaging studies:  Labs: Labs show the following:   Basic Metabolic Panel: Recent Labs  Lab 03/19/22 1331 03/20/22 0537  NA 138 138  K 3.6 3.7  CL 102 103  CO2 26 26  GLUCOSE 122* 99  BUN 20 16  CREATININE 0.87 0.79  CALCIUM 9.8 9.6   GFR Estimated Creatinine Clearance: 67.3 mL/min (by C-G formula based on SCr of 0.79 mg/dL). Liver Function Tests: Recent Labs  Lab 03/19/22 1331  AST 37  ALT 15  ALKPHOS 96  BILITOT 0.7  PROT 8.0  ALBUMIN 4.4   No results for input(s): "LIPASE", "AMYLASE" in the last 168 hours. Recent Labs  Lab 03/19/22 1555  AMMONIA 26   Coagulation profile No results for input(s): "INR", "PROTIME" in the last 168 hours.  CBC: Recent Labs  Lab 03/19/22 1331 03/20/22 0537  WBC 11.5* 5.9  NEUTROABS 10.2*  --   HGB 10.7* 10.7*  HCT 33.9* 33.3*  MCV 93.4 91.2  PLT 140* 138*   Cardiac Enzymes: Recent Labs  Lab 03/19/22 1331  CKTOTAL 339   BNP (last 3 results) No results for input(s): "PROBNP" in the last 8760 hours. CBG: Recent Labs  Lab 03/19/22 1420  GLUCAP 118*   D-Dimer: No results for input(s): "DDIMER" in the last 72 hours. Hgb A1c: No results for input(s): "HGBA1C" in the last 72 hours. Lipid Profile: No results for input(s): "CHOL", "HDL", "LDLCALC", "TRIG", "CHOLHDL", "LDLDIRECT" in the last 72 hours. Thyroid function studies: Recent Labs    03/20/22 0537  TSH 0.646   Anemia work up: Recent Labs    03/20/22 0537  VITAMINB12 382   Sepsis Labs: Recent Labs  Lab 03/19/22 1331 03/20/22 0537  PROCALCITON <0.10  --   WBC 11.5* 5.9  LATICACIDVEN 1.2  --     Microbiology No results found for  this or any previous visit (from the past 240 hour(s)).  Procedures and diagnostic studies:  ECHOCARDIOGRAM COMPLETE  Result Date: 03/20/2022    ECHOCARDIOGRAM REPORT   Patient Name:   Bryan [name] Date of Exam: 03/20/2022 Medical Rec #:  [id number]            Height:       72.0 in Accession #:    [id number]  Weight:       150.0 lb Date of Birth:  Oct 13, 1940             BSA:          1.885 m Patient Age:    36 years             BP:           174/85 mmHg Patient Gender: M                    HR:           67 bpm. Exam Location:  [facility] Procedure: 2D Echo, Cardiac Doppler and Color Doppler Indications:     Acute encephalopathy  History:         Patient has prior history of Echocardiogram examinations, most                  recent 04/28/2021. CAD, Pulmonary HTN, Arrythmias:Atrial                  Fibrillation, Signs/Symptoms:Murmur; Risk Factors:Hypertension,                  Dyslipidemia and Former Smoker. 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  Sonographer:     Wenda Low Referring Phys:  [id number] Jose Persia Diagnosing Phys: [place]  1. Left ventricular ejection fraction, by estimation, is 60 to 65%. The left ventricle has normal function. The left ventricle has no regional wall motion abnormalities. The left ventricular internal cavity size was mildly dilated. There is mild concentric left ventricular hypertrophy. Left ventricular diastolic parameters are indeterminate.  2. Right ventricular systolic function is mildly reduced. The right ventricular size is moderately enlarged. There is mildly elevated pulmonary artery systolic pressure.  3. Left atrial size was severely dilated.  4. Right atrial size was moderately dilated.  5. The mitral valve is degenerative. Mild to moderate mitral valve regurgitation. No evidence of mitral stenosis. Moderate to severe mitral annular calcification.  6. Tricuspid valve  regurgitation is moderate.  7. The aortic valve is calcified. Aortic valve regurgitation is mild to moderate. Mild to moderate aortic valve stenosis.  8. The inferior vena cava is normal in size with greater than 50% respiratory variability, suggesting right atrial pressure of 3 mmHg. FINDINGS  Left Ventricle: Left ventricular ejection fraction, by estimation, is 60 to 65%. The left ventricle has normal function. The left ventricle has no regional wall motion abnormalities. The left ventricular internal cavity size was mildly dilated. There is  mild concentric left ventricular hypertrophy. Left ventricular diastolic parameters are indeterminate. Right Ventricle: The right ventricular size is moderately enlarged. No increase in right ventricular wall thickness. Right ventricular systolic function is mildly reduced. There is mildly elevated pulmonary artery systolic pressure. The tricuspid regurgitant velocity is 2.69 m/s, and with an assumed right atrial pressure of 8 mmHg, the estimated right ventricular systolic pressure is 27.0 mmHg. Left Atrium: Left atrial size was severely dilated. Right Atrium: Right atrial size was moderately dilated. Pericardium: There is no evidence of pericardial effusion. Mitral Valve: The mitral valve is degenerative in appearance. There is mild calcification of the mitral valve leaflet(s). Moderate to severe mitral annular calcification. Mild to moderate mitral valve regurgitation. No evidence of mitral valve stenosis. MV peak gradient, 2.6 mmHg. The mean mitral valve gradient is 1.0 mmHg.  Tricuspid Valve: The tricuspid valve is normal in structure. Tricuspid valve regurgitation is moderate . No evidence of tricuspid stenosis. Aortic Valve: The aortic valve is calcified. Aortic valve regurgitation is mild to moderate. Mild to moderate aortic stenosis is present. Aortic valve mean gradient measures 9.5 mmHg. Aortic valve peak gradient measures 21.2 mmHg. Aortic valve area, by VTI  measures 1.84 cm. Pulmonic Valve: The pulmonic valve was normal in structure. Pulmonic valve regurgitation is trivial. No evidence of pulmonic stenosis. Aorta: The aortic root is normal in size and structure. Venous: The inferior vena cava is normal in size with greater than 50% respiratory variability, suggesting right atrial pressure of 3 mmHg. IAS/Shunts: No atrial level shunt detected by color flow Doppler.  LEFT VENTRICLE PLAX 2D LVIDd:         3.70 cm LVIDs:         2.50 cm LV PW:         1.50 cm LV IVS:        1.20 cm LVOT diam:     2.00 cm LV SV:         71 LV SV Index:   38 LVOT Area:     3.14 cm  RIGHT VENTRICLE RV Basal diam:  4.50 cm RV Mid diam:    3.60 cm RV S prime:     12.40 cm/s LEFT ATRIUM              Index        RIGHT ATRIUM           Index LA diam:        4.10 cm  2.17 cm/m   RA Area:     20.70 cm LA Vol (A2C):   74.7 ml  39.62 ml/m  RA Volume:   60.00 ml  31.82 ml/m LA Vol (A4C):   108.0 ml 57.28 ml/m LA Biplane Vol: 88.6 ml  46.99 ml/m  AORTIC VALVE AV Area (Vmax):    1.91 cm AV Area (Vmean):   1.84 cm AV Area (VTI):     1.84 cm AV Vmax:           230.00 cm/s AV Vmean:          136.500 cm/s AV VTI:            0.388 m AV Peak Grad:      21.2 mmHg AV Mean Grad:      9.5 mmHg LVOT Vmax:         139.50 cm/s LVOT Vmean:        79.750 cm/s LVOT VTI:          0.228 m LVOT/AV VTI ratio: 0.59  AORTA Ao Root diam: 4.30 cm MITRAL VALVE               TRICUSPID VALVE MV Area (PHT): 3.97 cm    TR Peak grad:   28.9 mmHg MV Area VTI:   3.51 cm    TR Vmax:        269.00 cm/s MV Peak grad:  2.6 mmHg MV Mean grad:  1.0 mmHg    SHUNTS MV Vmax:       0.80 m/s    Systemic VTI:  0.23 m MV Vmean:      42.4 cm/s   Systemic Diam: 2.00 cm MV Decel Time: 191 msec MV E velocity: 79.70 cm/s Neoma Laming Electronically signed by Neoma Laming Signature Date/Time: 03/20/2022/11:14:14 AM    Final    MR BRAIN  WO CONTRAST  Result Date: 03/19/2022 CLINICAL DATA:  Mental status change, unknown cause. EXAM: MRI  HEAD WITHOUT CONTRAST TECHNIQUE: Multiplanar, multiecho pulse sequences of the brain and surrounding structures were obtained without intravenous contrast. COMPARISON:  Head CT March 19, 2022. FINDINGS: The study is partially degraded by motion. Brain: No acute infarction, hemorrhage, hydrocephalus, extra-axial collection or mass lesion. Scattered and confluent foci of T2 hyperintensity are seen within the white matter of the cerebral hemispheres, nonspecific, most likely related to chronic small vessel ischemia. Mild parenchymal volume loss. 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 Vascular: Normal flow voids. Skull and upper cervical spine: Normal marrow signal. Sinuses/Orbits: Negative. Other: None. IMPRESSION: 1. No acute intracranial abnormality. 2. Moderate chronic microvascular ischemic changes of the white matter. 3. 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 Electronically Signed   By: Pedro Earls M.D.   On: 03/19/2022 17:22   CT Head Wo Contrast  Result Date: 03/19/2022 CLINICAL DATA:  Mental status change, unknown cause EXAM: CT HEAD WITHOUT CONTRAST TECHNIQUE: Contiguous axial images were obtained from the base of the skull through the vertex without intravenous contrast. RADIATION DOSE REDUCTION: This exam was performed according to the departmental dose-optimization program which includes automated exposure control, adjustment of the mA and/or kV according to patient size and/or use of iterative reconstruction technique. COMPARISON:  CT head 12/14/2021. FINDINGS: Brain: No evidence of acute infarction, hemorrhage, hydrocephalus, extra-axial collection or mass lesion/mass effect. Patchy white matter hypodensities, nonspecific but compatible with chronic microvascular ischemic disease. Vascular: No hyperdense vessel identified. Skull: No acute fracture. Sinuses/Orbits: Clear sinuses.  No acute orbital findings. Other: No mastoid  effusions. IMPRESSION: No evidence of acute intracranial abnormality. Electronically Signed   By: Margaretha Sheffield M.D.   On: 03/19/2022 14:33   DG Chest Portable 1 View  Result Date: 03/19/2022 CLINICAL DATA:  Altered mental status EXAM: PORTABLE CHEST 1 VIEW COMPARISON:  11/12/2019 FINDINGS: Prior median sternotomy. Unchanged cardiomegaly. Patchy left mid peripheral opacities. No pleural effusion. No evidence of pneumothorax. No acute osseous abnormality. Thoracolumbar fusion hardware noted. Thoracic spondylosis. Multiple and mid to lower thoracic compression deformities and vertebroplasties. IMPRESSION: Faint peripheral left mid lung opacities, could be scarring or developing infection. Recommend short-term radiographic follow-up. Electronically Signed   By: Maurine Simmering M.D.   On: 03/19/2022 14:01               LOS: 0 days   Brealynn Contino  [facility]   Pager on [email or web address]. If 7PM-7AM, please contact night-coverage at [email or web address]     03/20/2022, 2:35 PM

## 2022-03-21 DIAGNOSIS — I480 Paroxysmal atrial fibrillation: Secondary | ICD-10-CM | POA: Diagnosis not present

## 2022-03-21 DIAGNOSIS — F0390 Unspecified dementia without behavioral disturbance: Secondary | ICD-10-CM | POA: Diagnosis not present

## 2022-03-21 DIAGNOSIS — I5032 Chronic diastolic (congestive) heart failure: Secondary | ICD-10-CM

## 2022-03-21 DIAGNOSIS — G9340 Encephalopathy, unspecified: Secondary | ICD-10-CM | POA: Diagnosis not present

## 2022-03-21 MED ORDER — MELATONIN 5 MG PO TABS
5.0000 mg | ORAL_TABLET | Freq: Every day | ORAL | Status: DC
  Administered 2022-03-21 – 2022-03-24 (×5): 5 mg via ORAL
  Filled 2022-03-21 (×5): qty 1

## 2022-03-21 NOTE — Progress Notes (Signed)
Progress Note    Bryan [name]  BOF:[id number] DOB: March 21, 1941  DOA: 03/19/2022 PCP: [name], MD      Brief Narrative:    Medical records reviewed and are as summarized below:  Bryan [name] is a 81 y.o. male with medical history significant for dementia, atrial fibrillation on Eliquis, chronic diastolic CHF, rheumatoid arthritis, hypertension, hyperlipidemia, who presented to the hospital with change in mental status different from his baseline.  [name], his stepdaughter, had gone to check on him and she noticed that patient was very confused.  She also found a spilled bottle of gabapentin near him.  Family was concerned that patient had been taking opioids and gabapentin together.  He was admitted to the hospital for probable acute toxic metabolic encephalopathy.  MRI brain did not show any evidence of acute intracranial abnormality.         Assessment/Plan:   Principal Problem:   Acute encephalopathy Active Problems:   Chronic diastolic CHF (congestive heart failure) (HCC)   Leukocytosis   Essential hypertension   Rheumatoid arthritis (HCC)   Paroxysmal atrial fibrillation (HCC)   Senile dementia uncomp, without behavioral disturbance (HCC)    Body mass index is 20.34 kg/m.  Probable acute toxic metabolic encephalopathy: Resolved.?  Taking extra dose of gabapentin.  Appreciate recommendations from neurologist.  Consider routine EEG on Monday. No evidence of acute stroke on MRI brain.  Leukocytosis: Improved  Paroxysmal atrial fibrillation/atrial flutter: He is no longer on Eliquis because of falls and recent scalp hematoma in August 2023.  Dementia, recurrent falls: Continue Aricept.  [name], stepdaughter, is concerned that patient is no longer able to care for himself at home.  PT and OT recommended discharge to SNF  Chronic diastolic CHF: Compensated  Other comorbidities include hypertension, rheumatoid arthritis  Diet Order              Diet Heart Room service appropriate? Yes; Fluid consistency: Thin  Diet effective now                            Consultants: Neurologist  Procedures: None    Medications:    amLODipine  5 mg Oral Daily   atorvastatin  40 mg Oral Daily   donepezil  10 mg Oral QHS   enoxaparin (LOVENOX) injection  40 mg Subcutaneous QHS   ferrous sulfate  325 mg Oral Q breakfast   FLUoxetine  20 mg Oral Daily   folic acid  1 mg Oral Daily   ipratropium  2 spray Each Nare QID   lisinopril  30 mg Oral Daily   loratadine  10 mg Oral Daily   melatonin  5 mg Oral QHS   multivitamin with minerals  1 tablet Oral Daily   pantoprazole  40 mg Oral Daily   sodium chloride flush  3 mL Intravenous Q12H   thiamine  100 mg Oral Daily   Continuous Infusions:   Anti-infectives (From admission, onward)    None              Family Communication/Anticipated D/C date and plan/Code Status   DVT prophylaxis: enoxaparin (LOVENOX) injection 40 mg Start: 03/20/22 2200     Code Status: DNR  Family Communication: None Disposition Plan: Awaiting placement to SNF   Status is: Observation The patient will require care spanning > 2 midnights and should be moved to inpatient because: Confusion, unsafe discharge plan  Subjective:   Interval events noted.  He has no complaints.  He said he feels much better today  Objective:    Vitals:   03/20/22 2043 03/21/22 0025 03/21/22 0705 03/21/22 0946  BP: (!) 149/82 (!) 171/87 (!) 160/81 (!) 148/80  Pulse: 79 69 70 73  Resp: '18 18 16 16  '$ Temp: 98.7 F (37.1 C) 98 F (36.7 C) (!) 97.3 F (36.3 C) (!) 97.2 F (36.2 C)  TempSrc:   Oral   SpO2: 98% 99% 99% 100%  Weight:      Height:       No data found.   Intake/Output Summary (Last 24 hours) at 03/21/2022 1115 Last data filed at 03/21/2022 1039 Gross per 24 hour  Intake 360 ml  Output 300 ml  Net 60 ml   Filed Weights   03/19/22 1327  Weight: 68 kg     Exam:  GEN: NAD SKIN: Warm and dry.  Abrasion on the anterior aspect of the right mid leg (present on admission) EYES: No pallor or icterus ENT: MMM CV: RRR PULM: CTA B ABD: soft, ND, NT, +BS CNS: AAO x 3, non focal EXT: No edema or tenderness        Data Reviewed:   I have personally reviewed following labs and imaging studies:  Labs: Labs show the following:   Basic Metabolic Panel: Recent Labs  Lab 03/19/22 1331 03/20/22 0537  NA 138 138  K 3.6 3.7  CL 102 103  CO2 26 26  GLUCOSE 122* 99  BUN 20 16  CREATININE 0.87 0.79  CALCIUM 9.8 9.6   GFR Estimated Creatinine Clearance: 67.3 mL/min (by C-G formula based on SCr of 0.79 mg/dL). Liver Function Tests: Recent Labs  Lab 03/19/22 1331  AST 37  ALT 15  ALKPHOS 96  BILITOT 0.7  PROT 8.0  ALBUMIN 4.4   No results for input(s): "LIPASE", "AMYLASE" in the last 168 hours. Recent Labs  Lab 03/19/22 1555  AMMONIA 26   Coagulation profile No results for input(s): "INR", "PROTIME" in the last 168 hours.  CBC: Recent Labs  Lab 03/19/22 1331 03/20/22 0537  WBC 11.5* 5.9  NEUTROABS 10.2*  --   HGB 10.7* 10.7*  HCT 33.9* 33.3*  MCV 93.4 91.2  PLT 140* 138*   Cardiac Enzymes: Recent Labs  Lab 03/19/22 1331  CKTOTAL 339   BNP (last 3 results) No results for input(s): "PROBNP" in the last 8760 hours. CBG: Recent Labs  Lab 03/19/22 1420  GLUCAP 118*   D-Dimer: No results for input(s): "DDIMER" in the last 72 hours. Hgb A1c: No results for input(s): "HGBA1C" in the last 72 hours. Lipid Profile: No results for input(s): "CHOL", "HDL", "LDLCALC", "TRIG", "CHOLHDL", "LDLDIRECT" in the last 72 hours. Thyroid function studies: Recent Labs    03/20/22 0537  TSH 0.646   Anemia work up: Recent Labs    03/20/22 0537  VITAMINB12 382   Sepsis Labs: Recent Labs  Lab 03/19/22 1331 03/20/22 0537  PROCALCITON <0.10  --   WBC 11.5* 5.9  LATICACIDVEN 1.2  --     Microbiology Recent  Results (from the past 240 hour(s))  Blood culture (single)     Status: None (Preliminary result)   Collection Time: 03/19/22  1:26 PM   Specimen: BLOOD  Result Value Ref Range Status   Specimen Description BLOOD BLOOD RIGHT ARM  Final   Special Requests   Final    BOTTLES DRAWN AEROBIC AND ANAEROBIC Blood Culture results may  not be optimal due to an inadequate volume of blood received in culture bottles   Culture   Final    NO GROWTH 2 DAYS Performed at [facility], [address] [place],  [address]    Report Status PENDING  Incomplete    Procedures and diagnostic studies:  ECHOCARDIOGRAM COMPLETE  Result Date: 03/20/2022    ECHOCARDIOGRAM REPORT   Patient Name:   SHONN FARRUGGIA Date of Exam: 03/20/2022 Medical Rec #:  [id number]            Height:       72.0 in Accession #:    [id number]           Weight:       150.0 lb Date of Birth:  1940-12-12             BSA:          1.885 m Patient Age:    74 years             BP:           174/85 mmHg Patient Gender: M                    HR:           67 bpm. Exam Location:  [facility] Procedure: 2D Echo, Cardiac Doppler and Color Doppler Indications:     Acute encephalopathy  History:         Patient has prior history of Echocardiogram examinations, most                  recent 04/28/2021. CAD, Pulmonary HTN, Arrythmias:Atrial                  Fibrillation, Signs/Symptoms:Murmur; Risk Factors:Hypertension,                  Dyslipidemia and Former Smoker. 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  Sonographer:     Wenda Low Referring Phys:  [id number] Jose Persia Diagnosing Phys: [place]  1. Left ventricular ejection fraction, by estimation, is 60 to 65%. The left ventricle has normal function. The left ventricle has no regional wall motion abnormalities. The left ventricular internal cavity size was mildly dilated. There is mild concentric left  ventricular hypertrophy. Left ventricular diastolic parameters are indeterminate.  2. Right ventricular systolic function is mildly reduced. The right ventricular size is moderately enlarged. There is mildly elevated pulmonary artery systolic pressure.  3. Left atrial size was severely dilated.  4. Right atrial size was moderately dilated.  5. The mitral valve is degenerative. Mild to moderate mitral valve regurgitation. No evidence of mitral stenosis. Moderate to severe mitral annular calcification.  6. Tricuspid valve regurgitation is moderate.  7. The aortic valve is calcified. Aortic valve regurgitation is mild to moderate. Mild to moderate aortic valve stenosis.  8. The inferior vena cava is normal in size with greater than 50% respiratory variability, suggesting right atrial pressure of 3 mmHg. FINDINGS  Left Ventricle: Left ventricular ejection fraction, by estimation, is 60 to 65%. The left ventricle has normal function. The left ventricle has no regional wall motion abnormalities. The left ventricular internal cavity size was mildly dilated. There is  mild concentric left ventricular hypertrophy. Left ventricular diastolic parameters are indeterminate. Right Ventricle: The right ventricular size is moderately enlarged. No  increase in right ventricular wall thickness. Right ventricular systolic function is mildly reduced. There is mildly elevated pulmonary artery systolic pressure. The tricuspid regurgitant velocity is 2.69 m/s, and with an assumed right atrial pressure of 8 mmHg, the estimated right ventricular systolic pressure is 05.6 mmHg. Left Atrium: Left atrial size was severely dilated. Right Atrium: Right atrial size was moderately dilated. Pericardium: There is no evidence of pericardial effusion. Mitral Valve: The mitral valve is degenerative in appearance. There is mild calcification of the mitral valve leaflet(s). Moderate to severe mitral annular calcification. Mild to moderate mitral valve  regurgitation. No evidence of mitral valve stenosis. MV peak gradient, 2.6 mmHg. The mean mitral valve gradient is 1.0 mmHg. Tricuspid Valve: The tricuspid valve is normal in structure. Tricuspid valve regurgitation is moderate . No evidence of tricuspid stenosis. Aortic Valve: The aortic valve is calcified. Aortic valve regurgitation is mild to moderate. Mild to moderate aortic stenosis is present. Aortic valve mean gradient measures 9.5 mmHg. Aortic valve peak gradient measures 21.2 mmHg. Aortic valve area, by VTI measures 1.84 cm. Pulmonic Valve: The pulmonic valve was normal in structure. Pulmonic valve regurgitation is trivial. No evidence of pulmonic stenosis. Aorta: The aortic root is normal in size and structure. Venous: The inferior vena cava is normal in size with greater than 50% respiratory variability, suggesting right atrial pressure of 3 mmHg. IAS/Shunts: No atrial level shunt detected by color flow Doppler.  LEFT VENTRICLE PLAX 2D LVIDd:         3.70 cm LVIDs:         2.50 cm LV PW:         1.50 cm LV IVS:        1.20 cm LVOT diam:     2.00 cm LV SV:         71 LV SV Index:   38 LVOT Area:     3.14 cm  RIGHT VENTRICLE RV Basal diam:  4.50 cm RV Mid diam:    3.60 cm RV S prime:     12.40 cm/s LEFT ATRIUM              Index        RIGHT ATRIUM           Index LA diam:        4.10 cm  2.17 cm/m   RA Area:     20.70 cm LA Vol (A2C):   74.7 ml  39.62 ml/m  RA Volume:   60.00 ml  31.82 ml/m LA Vol (A4C):   108.0 ml 57.28 ml/m LA Biplane Vol: 88.6 ml  46.99 ml/m  AORTIC VALVE AV Area (Vmax):    1.91 cm AV Area (Vmean):   1.84 cm AV Area (VTI):     1.84 cm AV Vmax:           230.00 cm/s AV Vmean:          136.500 cm/s AV VTI:            0.388 m AV Peak Grad:      21.2 mmHg AV Mean Grad:      9.5 mmHg LVOT Vmax:         139.50 cm/s LVOT Vmean:        79.750 cm/s LVOT VTI:          0.228 m LVOT/AV VTI ratio: 0.59  AORTA Ao Root diam: 4.30 cm MITRAL VALVE  TRICUSPID VALVE MV Area (PHT):  3.97 cm    TR Peak grad:   28.9 mmHg MV Area VTI:   3.51 cm    TR Vmax:        269.00 cm/s MV Peak grad:  2.6 mmHg MV Mean grad:  1.0 mmHg    SHUNTS MV Vmax:       0.80 m/s    Systemic VTI:  0.23 m MV Vmean:      42.4 cm/s   Systemic Diam: 2.00 cm MV Decel Time: 191 msec MV E velocity: 79.70 cm/s Neoma Laming Electronically signed by Neoma Laming Signature Date/Time: 03/20/2022/11:14:14 AM    Final    MR BRAIN WO CONTRAST  Result Date: 03/19/2022 CLINICAL DATA:  Mental status change, unknown cause. EXAM: MRI HEAD WITHOUT CONTRAST TECHNIQUE: Multiplanar, multiecho pulse sequences of the brain and surrounding structures were obtained without intravenous contrast. COMPARISON:  Head CT March 19, 2022. FINDINGS: The study is partially degraded by motion. Brain: No acute infarction, hemorrhage, hydrocephalus, extra-axial collection or mass lesion. Scattered and confluent foci of T2 hyperintensity are seen within the white matter of the cerebral hemispheres, nonspecific, most likely related to chronic small vessel ischemia. Mild parenchymal volume loss. 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 Vascular: Normal flow voids. Skull and upper cervical spine: Normal marrow signal. Sinuses/Orbits: Negative. Other: None. IMPRESSION: 1. No acute intracranial abnormality. 2. Moderate chronic microvascular ischemic changes of the white matter. 3. 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 Electronically Signed   By: Pedro Earls M.D.   On: 03/19/2022 17:22   CT Head Wo Contrast  Result Date: 03/19/2022 CLINICAL DATA:  Mental status change, unknown cause EXAM: CT HEAD WITHOUT CONTRAST TECHNIQUE: Contiguous axial images were obtained from the base of the skull through the vertex without intravenous contrast. RADIATION DOSE REDUCTION: This exam was performed according to the departmental dose-optimization program which includes automated exposure control,  adjustment of the mA and/or kV according to patient size and/or use of iterative reconstruction technique. COMPARISON:  CT head 12/14/2021. FINDINGS: Brain: No evidence of acute infarction, hemorrhage, hydrocephalus, extra-axial collection or mass lesion/mass effect. Patchy white matter hypodensities, nonspecific but compatible with chronic microvascular ischemic disease. Vascular: No hyperdense vessel identified. Skull: No acute fracture. Sinuses/Orbits: Clear sinuses.  No acute orbital findings. Other: No mastoid effusions. IMPRESSION: No evidence of acute intracranial abnormality. Electronically Signed   By: Margaretha Sheffield M.D.   On: 03/19/2022 14:33   DG Chest Portable 1 View  Result Date: 03/19/2022 CLINICAL DATA:  Altered mental status EXAM: PORTABLE CHEST 1 VIEW COMPARISON:  11/12/2019 FINDINGS: Prior median sternotomy. Unchanged cardiomegaly. Patchy left mid peripheral opacities. No pleural effusion. No evidence of pneumothorax. No acute osseous abnormality. Thoracolumbar fusion hardware noted. Thoracic spondylosis. Multiple and mid to lower thoracic compression deformities and vertebroplasties. IMPRESSION: Faint peripheral left mid lung opacities, could be scarring or developing infection. Recommend short-term radiographic follow-up. Electronically Signed   By: Maurine Simmering M.D.   On: 03/19/2022 14:01               LOS: 0 days   Elius Etheredge  [facility]   Pager on [email or web address]. If 7PM-7AM, please contact night-coverage at [email or web address]     03/21/2022, 11:15 AM

## 2022-03-21 NOTE — Progress Notes (Signed)
Neurology Progress Note  Patient ID: Bryan [name] is a 81 y.o. with PMHx of  has a past medical history of Abnormal CT scan, Anemia, Anxiety, Arrhythmia, Basal cell carcinoma (04/28/2020), Basal cell carcinoma (05/06/2021), Basal cell carcinoma (05/06/2021), CHF (congestive heart failure) (HCC), Chronic lower back pain, Complication of anesthesia, Coronary artery disease, Depression, GERD (gastroesophageal reflux disease), Glaucoma, H/O thoracic aortic aneurysm repair, Headache, Heart murmur, History of being hospitalized, History of blood transfusion, History of chicken pox, Hyperlipidemia, Hypertension, Lumbar stenosis, Murmur, Neuropathy, Osteoarthritis, Paroxysmal A-fib (HCC), Poor short term memory, Rheumatoid arthritis ([place]), Schizophrenia ([place]), Seasonal allergies, Thoracic spine pain, Valvular heart disease, and Wears glasses.  Subjective: Sister Bryan [name] at bedside corroborates history obtained previously, that the patient was found with his house in disarray, pills spilled and having urinated on himself.  She is most concerned about the possibility of medication error/dementia more than seizure  Denies any seizure risk factors for the patient including any prior significant TBI (noting he did not lose consciousness with the fall that he had in July), meningitis/encephalitis, history of prior seizures including any febrile seizures, any issues with his growth or development, any family history of seizures  Exam: Vitals:   03/21/22 0705 03/21/22 0946  BP: (!) 160/81 (!) 148/80  Pulse: 70 73  Resp: 16 16  Temp: (!) 97.3 F (36.3 C) (!) 97.2 F (36.2 C)  SpO2: 99% 100%   Gen: In bed, comfortable  Resp: non-labored breathing, no grossly audible wheezing Cardiac: Perfusing extremities well  Abd: soft, nt  Neuro: MS: Awake, alert, reports his age correctly today and knows that it is October 2023.  However confused about details such as how long ago his wife passed away.  Remains  amnestic to recent events. CN: Face symmetric, tongue midline, EOMI to tracking examiner, hearing intact to voice Motor: Using bilateral upper extremities equally, lower extremities not tested  Pertinent Labs:  B12 borderline low at 382 Thiamine remains in process, MMA pending RPR, HIV negative TSH within normal limits Ammonia within normal limits   Impression: I continue to favor that this is an dementia related fluctuation with possible overdose of medication, but cannot rule out seizure.  Discussed at length with sister at bedside as well as patient, who remains somewhat confused but markedly improved from yesterday  Recommendations: -Routine EEG on Monday -Discussed with patient if there is evidence of epileptogenic activity would start Keppra 250 mg BID initially given his age -If EEG is negative, would not start antiseizure medications -Recommend outpatient neurocognitive testing, can follow-up with Dr. [name] -Neurology will follow-up EEG but otherwise will be available as needed going forward, please reach out if additional questions or concerns arise  Lesleigh Noe MD-PhD [facility] [phone]  [facility] coverage for [facility] is from 8 AM to 4 AM in-house and 4 PM to 8 PM by telephone/video. 8 PM to 8 AM emergent questions or overnight urgent questions should be addressed to Teleneurology On-call or [facility] neurohospitalist; contact information can be found on AMION

## 2022-03-22 DIAGNOSIS — E43 Unspecified severe protein-calorie malnutrition: Secondary | ICD-10-CM | POA: Diagnosis not present

## 2022-03-22 DIAGNOSIS — I480 Paroxysmal atrial fibrillation: Secondary | ICD-10-CM | POA: Diagnosis not present

## 2022-03-22 DIAGNOSIS — I5032 Chronic diastolic (congestive) heart failure: Secondary | ICD-10-CM | POA: Diagnosis not present

## 2022-03-22 DIAGNOSIS — F0390 Unspecified dementia without behavioral disturbance: Secondary | ICD-10-CM | POA: Diagnosis not present

## 2022-03-22 DIAGNOSIS — G9340 Encephalopathy, unspecified: Secondary | ICD-10-CM | POA: Diagnosis not present

## 2022-03-22 MED ORDER — ENSURE ENLIVE PO LIQD
237.0000 mL | Freq: Two times a day (BID) | ORAL | Status: DC
  Administered 2022-03-22 – 2022-03-25 (×6): 237 mL via ORAL

## 2022-03-22 MED ORDER — TRAZODONE HCL 50 MG PO TABS
50.0000 mg | ORAL_TABLET | Freq: Every day | ORAL | Status: DC
  Administered 2022-03-22 – 2022-03-24 (×3): 50 mg via ORAL
  Filled 2022-03-22 (×3): qty 1

## 2022-03-22 NOTE — Progress Notes (Signed)
Occupational Therapy Treatment Patient Details Name: Bryan [name] MRN: [id number] DOB: [birth year] Today's Date: 03/22/2022   History of present illness AMIEL MCCAFFREY is a 81 y.o. 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 Pt [facility] for AMS. MRI unrevealing.   OT comments  Pt seen for OT tx this date. Pt agreeable and pleasant throughout. Pt ambulated around nurses station with CGA using RW while engaging in dual-task activity to challenge balance and cognition. Pt negotiated environment well with no LOB noted and no overt decrease in mobility speed or quality. PRN VC for posture and RW mgt. Pt progressing towards goals, continues to benefit from skilled OT services.    Recommendations for follow up therapy are one component of a multi-disciplinary discharge planning process, led by the attending physician.  Recommendations may be updated based on patient status, additional functional criteria and insurance authorization.    Follow Up Recommendations  Skilled nursing-short term rehab (<3 hours/day)    Assistance Recommended at Discharge Frequent or constant Supervision/Assistance  Patient can return home with the following  A little help with walking and/or transfers;Assistance with cooking/housework;Assist for transportation;Help with stairs or ramp for entrance;Direct supervision/assist for medications management;Direct supervision/assist for financial management;A little help with bathing/dressing/bathroom   Equipment Recommendations  Other (comment) (defer to next venue)    Recommendations for Other Services      Precautions / Restrictions Precautions Precautions: Fall Restrictions Weight Bearing Restrictions: No        Mobility Bed Mobility Overal bed mobility: Needs Assistance Bed Mobility: Supine to Sit, Sit to Supine     Supine to sit: Supervision, HOB elevated Sit to supine: Supervision        Transfers Overall transfer level: Needs assistance Equipment used: Rolling walker (2 wheels) Transfers: Sit to/from Stand Sit to Stand: Min guard, Supervision                 Balance Overall balance assessment: Needs assistance Sitting-balance support: Feet supported Sitting balance-Leahy Scale: Good     Standing balance support: Bilateral upper extremity supported Standing balance-Leahy Scale: Fair                             ADL either performed or assessed with clinical judgement   ADL Overall ADL's : Needs assistance/impaired                     Lower Body Dressing: Sitting/lateral leans;Minimal assistance Lower Body Dressing Details (indicate cue type and reason): MIN A for donning house shoes                    Extremity/Trunk Assessment              Vision       Perception     Praxis      Cognition Arousal/Alertness: Awake/alert Behavior During Therapy: WFL for tasks assessed/performed Overall Cognitive Status: History of cognitive impairments - at baseline                                 General Comments: Pt follows simple commands well, appropriate throughout        Exercises Other Exercises Other Exercises:  Pt ambulated around nurses station with CGA using RW while engaging in dual-task activity to challenge balance and cognition. Pt negotiated environment well with no LOB noted and no overt decrease in mobility speed or quality. PRN VC for posture and RW mgt.    Shoulder Instructions       General Comments      Pertinent Vitals/ Pain       Pain Assessment Pain Assessment: No/denies pain  Home Living                                          Prior Functioning/Environment               Frequency  Min 2X/week        Progress Toward Goals  OT Goals(current goals can now be found in the care plan section)  Progress towards OT goals: Progressing toward goals  Acute Rehab OT Goals Patient Stated Goal: get stronger OT Goal Formulation: With patient Time For Goal Achievement: 04/03/22 Potential to Achieve Goals: Good  Plan Discharge plan remains appropriate;Frequency remains appropriate    Co-evaluation                 AM-PAC OT "6 Clicks" Daily Activity     Outcome Measure   Help from another person eating meals?: None Help from another person taking care of personal grooming?: A Little Help from another person toileting, which includes using toliet, bedpan, or urinal?: A Little Help from another person bathing (including washing, rinsing, drying)?: A Lot Help from another person to put on and taking off regular upper body clothing?: A Little Help from another person to put on and taking off regular lower body clothing?: A Little 6 Click Score: 18    End of Session Equipment Utilized During Treatment: Gait belt;Rolling walker (2 wheels)  OT Visit Diagnosis: Unsteadiness on feet (R26.81);Muscle weakness (generalized) (M62.81);Other symptoms and signs involving cognitive function   Activity Tolerance Patient tolerated treatment well   Patient Left in bed;with call bell/phone within reach;with bed alarm set;with family/visitor present   Nurse Communication          Time: 1547-1600 OT Time Calculation (min): 13 min  Charges: OT General Charges $OT Visit: 1 Visit OT Treatments $Therapeutic Activity: 8-22 mins  Ardeth Perfect., MPH, MS, OTR/L ascom [phone] 03/22/22, 4:48 PM

## 2022-03-22 NOTE — Progress Notes (Signed)
Eeg done 

## 2022-03-22 NOTE — Progress Notes (Signed)
Initial Nutrition Assessment  DOCUMENTATION CODES:   Severe malnutrition in context of chronic illness  INTERVENTION:   -Ensure Enlive po BID, each supplement provides 350 kcal and 20 grams of protein -MVI with minerals daily -Liberalize diet to 2 gram sodium for wider variety of meal selections  NUTRITION DIAGNOSIS:   Severe Malnutrition related to chronic illness (dementia) as evidenced by severe fat depletion, moderate fat depletion, moderate muscle depletion, severe muscle depletion.  GOAL:   Patient will meet greater than or equal to 90% of their needs  MONITOR:   PO intake, Supplement acceptance  REASON FOR ASSESSMENT:   Malnutrition Screening Tool    ASSESSMENT:   Pt with medical history significant for dementia, atrial fibrillation on Eliquis, chronic diastolic CHF, rheumatoid arthritis, hypertension, hyperlipidemia, who presented to the hospital with change in mental status different from his baseline.  Pt admitted with metabolic encephalopathy.   10/30- EEG WDL  Reviewed I/O's: +330 ml x 24 hours and +150 ml x 24 hours  UOP: 150 ml x 24 hours  Spoke with pt at bedside, who was pleasant in good spirits today. He reports feeling better. Pt shares that he has a good appetite and typically consumes 3 meals per day. Per pt, he lives alone and meals usually consist of canned soup, sandwiches, or TV dinners. He admits that he has eaten less over the years than usual and has lost weight. Since being admitted to the hospital, intake has improved and pt has consumed 100% of meals.   Pt shares his UBW is around 205# and he progressively lost about 30 pounds over the past 2 years, which he attributes to COVID, abdominal surgery requiring hospitalization, and his wife passing away. Reviewed wt hx; wt has been stable over the past 3 months.   Discussed importance of good meal and supplement intake to promote healing. Pt is amenable to Ensure, stating he drinks this  occasionally, but not routinely.   Medications reviewed and include ferrous sulfate, folic acid, and thiamine.   Lab Results  Component Value Date   HGBA1C 5.8 10/17/2015   PTA DM medications are none.   Labs reviewed: CBGS: 118 (inpatient orders for glycemic control are none).    NUTRITION - FOCUSED PHYSICAL EXAM:  Flowsheet Row Most Recent Value  Orbital Region Severe depletion  Upper Arm Region Moderate depletion  Thoracic and Lumbar Region Moderate depletion  Buccal Region Severe depletion  Temple Region Severe depletion  Clavicle Bone Region Severe depletion  Clavicle and Acromion Bone Region Severe depletion  Scapular Bone Region Severe depletion  Dorsal Hand Moderate depletion  Patellar Region Moderate depletion  Anterior Thigh Region Moderate depletion  Posterior Calf Region Moderate depletion  Edema (RD Assessment) Mild  Hair Reviewed  Eyes Reviewed  Mouth Reviewed  Skin Reviewed  Nails Reviewed       Diet Order:   Diet Order             Diet Heart Room service appropriate? Yes; Fluid consistency: Thin  Diet effective now                   EDUCATION NEEDS:   Education needs have been addressed  Skin:  Skin Assessment: Reviewed RN Assessment  Last BM:  03/22/22 (type 4)  Height:   Ht Readings from Last 1 Encounters:  03/19/22 6' (1.829 m)    Weight:   Wt Readings from Last 1 Encounters:  03/19/22 68 kg    Ideal Body Weight:  80.9  kg  BMI:  Body mass index is 20.34 kg/m.  Estimated Nutritional Needs:   Kcal:  2050-2250  Protein:  105-120 grams  Fluid:  > 2 L    Loistine Chance, RD, LDN, [place] Registered Dietitian II Certified Diabetes Care and Education Specialist Please refer to [facility] for RD and/or RD on-call/weekend/after hours pager

## 2022-03-22 NOTE — Progress Notes (Signed)
Progress Note    Bryan [name]  ZOX:[id number] DOB: 1940-05-28  DOA: 03/19/2022 PCP: [name], MD      Brief Narrative:    Medical records reviewed and are as summarized below:  Bryan [name] is a 81 y.o. male with medical history significant for dementia, atrial fibrillation on Eliquis, chronic diastolic CHF, rheumatoid arthritis, hypertension, hyperlipidemia, who presented to the hospital with change in mental status different from his baseline.  [name], his stepdaughter, had gone to check on him and she noticed that patient was very confused.  She also found a spilled bottle of gabapentin near him.  Family was concerned that patient had been taking opioids and gabapentin together.  He was admitted to the hospital for probable acute toxic metabolic encephalopathy.  MRI brain did not show any evidence of acute intracranial abnormality.         Assessment/Plan:   Principal Problem:   Acute encephalopathy Active Problems:   Chronic diastolic CHF (congestive heart failure) (HCC)   Leukocytosis   Essential hypertension   Rheumatoid arthritis (HCC)   Paroxysmal atrial fibrillation (HCC)   Senile dementia uncomp, without behavioral disturbance (HCC)    Body mass index is 20.34 kg/m.  Probable acute toxic metabolic encephalopathy: Resolved.?  taking extra dose of gabapentin.  Appreciate recommendations from neurologist.  EEG done on 03/22/2022 was normal.  MRI brain did not show any evidence of acute abnormality.  Leukocytosis: Improved  Paroxysmal atrial fibrillation/atrial flutter: He is no longer on Eliquis because of falls and recent scalp hematoma in August 2023.  Dementia, recurrent falls: Continue Aricept.  [name], stepdaughter, is concerned that patient is no longer able to care for himself at home.  PT and OT recommended discharge to SNF  Chronic diastolic CHF: Compensated  Other comorbidities include hypertension, rheumatoid  arthritis  Diet Order             Diet Heart Room service appropriate? Yes; Fluid consistency: Thin  Diet effective now                            Consultants: Neurologist  Procedures: None    Medications:    amLODipine  5 mg Oral Daily   atorvastatin  40 mg Oral Daily   donepezil  10 mg Oral QHS   enoxaparin (LOVENOX) injection  40 mg Subcutaneous QHS   ferrous sulfate  325 mg Oral Q breakfast   FLUoxetine  20 mg Oral Daily   folic acid  1 mg Oral Daily   ipratropium  2 spray Each Nare QID   lisinopril  30 mg Oral Daily   loratadine  10 mg Oral Daily   melatonin  5 mg Oral QHS   multivitamin with minerals  1 tablet Oral Daily   pantoprazole  40 mg Oral Daily   thiamine  100 mg Oral Daily   traZODone  50 mg Oral QHS   Continuous Infusions:   Anti-infectives (From admission, onward)    None              Family Communication/Anticipated D/C date and plan/Code Status   DVT prophylaxis: enoxaparin (LOVENOX) injection 40 mg Start: 03/20/22 2200     Code Status: DNR  Family Communication: None Disposition Plan: Awaiting placement to SNF   Status is: Observation The patient will require care spanning > 2 midnights and should be moved to inpatient because: Confusion, unsafe discharge plan  Subjective:   Interval events noted.  He has no complaints.  He feels better today.  Objective:    Vitals:   03/21/22 1638 03/21/22 1955 03/22/22 0459 03/22/22 0807  BP: (!) 141/90 136/77 (!) 155/83 (!) 156/97  Pulse: 64 79 70 68  Resp: '15 18 16 16  '$ Temp: 97.8 F (36.6 C) 98 F (36.7 C) 97.7 F (36.5 C) (!) 97.5 F (36.4 C)  TempSrc:  Oral Oral   SpO2: 100% 100% 99% 100%  Weight:      Height:       No data found.   Intake/Output Summary (Last 24 hours) at 03/22/2022 1326 Last data filed at 03/22/2022 0700 Gross per 24 hour  Intake 240 ml  Output 150 ml  Net 90 ml   Filed Weights   03/19/22 1327  Weight: 68 kg     Exam:  GEN: NAD SKIN: Abrasion on the anterior aspect of the right middle leg EYES: EOMI ENT: MMM CV: RRR PULM: CTA B ABD: soft, ND, NT, +BS CNS: AAO x 3, non focal EXT: No edema or tenderness      Data Reviewed:   I have personally reviewed following labs and imaging studies:  Labs: Labs show the following:   Basic Metabolic Panel: Recent Labs  Lab 03/19/22 1331 03/20/22 0537  NA 138 138  K 3.6 3.7  CL 102 103  CO2 26 26  GLUCOSE 122* 99  BUN 20 16  CREATININE 0.87 0.79  CALCIUM 9.8 9.6   GFR Estimated Creatinine Clearance: 67.3 mL/min (by C-G formula based on SCr of 0.79 mg/dL). Liver Function Tests: Recent Labs  Lab 03/19/22 1331  AST 37  ALT 15  ALKPHOS 96  BILITOT 0.7  PROT 8.0  ALBUMIN 4.4   No results for input(s): "LIPASE", "AMYLASE" in the last 168 hours. Recent Labs  Lab 03/19/22 1555  AMMONIA 26   Coagulation profile No results for input(s): "INR", "PROTIME" in the last 168 hours.  CBC: Recent Labs  Lab 03/19/22 1331 03/20/22 0537  WBC 11.5* 5.9  NEUTROABS 10.2*  --   HGB 10.7* 10.7*  HCT 33.9* 33.3*  MCV 93.4 91.2  PLT 140* 138*   Cardiac Enzymes: Recent Labs  Lab 03/19/22 1331  CKTOTAL 339   BNP (last 3 results) No results for input(s): "PROBNP" in the last 8760 hours. CBG: Recent Labs  Lab 03/19/22 1420  GLUCAP 118*   D-Dimer: No results for input(s): "DDIMER" in the last 72 hours. Hgb A1c: No results for input(s): "HGBA1C" in the last 72 hours. Lipid Profile: No results for input(s): "CHOL", "HDL", "LDLCALC", "TRIG", "CHOLHDL", "LDLDIRECT" in the last 72 hours. Thyroid function studies: Recent Labs    03/20/22 0537  TSH 0.646   Anemia work up: Recent Labs    03/20/22 0537  VITAMINB12 382   Sepsis Labs: Recent Labs  Lab 03/19/22 1331 03/20/22 0537  PROCALCITON <0.10  --   WBC 11.5* 5.9  LATICACIDVEN 1.2  --     Microbiology Recent Results (from the past 240 hour(s))  Blood culture  (single)     Status: None (Preliminary result)   Collection Time: 03/19/22  1:26 PM   Specimen: BLOOD  Result Value Ref Range Status   Specimen Description BLOOD BLOOD RIGHT ARM  Final   Special Requests   Final    BOTTLES DRAWN AEROBIC AND ANAEROBIC Blood Culture results may not be optimal due to an inadequate volume of blood received in culture bottles   Culture  Final    NO GROWTH 3 DAYS Performed at [facility], [address] [place], [place] [address]    Report Status PENDING  Incomplete    Procedures and diagnostic studies:  EEG adult  Result Date: 03/22/2022 Greta Doom, MD     03/22/2022  1:01 PM History: 81 year old male being evaluated for an episode of altered mental status Sedation: None Technique: This EEG was acquired with electrodes placed according to the International 10-20 electrode system (including Fp1, Fp2, F3, F4, C3, C4, P3, P4, O1, O2, T3, T4, T5, T6, A1, A2, Fz, Cz, Pz). The following electrodes were missing or displaced: none. Background: The background consists of intermixed alpha and beta activities. There is a well defined posterior dominant rhythm of 8-9 hz that attenuates with eye opening. Sleep is recorded with normal appearing structures. Photic stimulation: Physiologic driving is present EEG Abnormalities: None Clinical Interpretation: This normal EEG is recorded in the waking and sleep state. There was no seizure or seizure predisposition recorded on this study. Please note that lack of epileptiform activity on EEG does not preclude the possibility of epilepsy. Roland Rack, MD [facility] [phone] If 7pm- 7am, please page neurology on call as listed in [place].               LOS: 0 days   [place]  [facility]   Pager on [email or web address]. If 7PM-7AM, please contact night-coverage at [email or web address]     03/22/2022, 1:26 PM

## 2022-03-22 NOTE — Progress Notes (Signed)
EEG is negative, patient can follow-up with outpatient neurology for neurocognitive testing.  No further inpatient recommendations at this time, please call with further questions or concerns.  Roland Rack, MD [facility] [phone]  If 7pm- 7am, please page neurology on call as listed in [place].

## 2022-03-22 NOTE — Procedures (Signed)
History: 81 year old male being evaluated for an episode of altered mental status  Sedation: None  Technique: This EEG was acquired with electrodes placed according to the International 10-20 electrode system (including Fp1, Fp2, F3, F4, C3, C4, P3, P4, O1, O2, T3, T4, T5, T6, A1, A2, Fz, Cz, Pz). The following electrodes were missing or displaced: none.   Background: The background consists of intermixed alpha and beta activities. There is a well defined posterior dominant rhythm of 8-9 hz that attenuates with eye opening. Sleep is recorded with normal appearing structures.   Photic stimulation: Physiologic driving is present  EEG Abnormalities: None  Clinical Interpretation: This normal EEG is recorded in the waking and sleep state. There was no seizure or seizure predisposition recorded on this study. Please note that lack of epileptiform activity on EEG does not preclude the possibility of epilepsy.   Roland Rack, MD [facility] [phone]  If 7pm- 7am, please page neurology on call as listed in [place].

## 2022-03-23 DIAGNOSIS — I5032 Chronic diastolic (congestive) heart failure: Secondary | ICD-10-CM | POA: Diagnosis not present

## 2022-03-23 DIAGNOSIS — F0390 Unspecified dementia without behavioral disturbance: Secondary | ICD-10-CM | POA: Diagnosis not present

## 2022-03-23 DIAGNOSIS — I480 Paroxysmal atrial fibrillation: Secondary | ICD-10-CM | POA: Diagnosis not present

## 2022-03-23 DIAGNOSIS — E43 Unspecified severe protein-calorie malnutrition: Secondary | ICD-10-CM | POA: Diagnosis present

## 2022-03-23 DIAGNOSIS — G9340 Encephalopathy, unspecified: Secondary | ICD-10-CM | POA: Diagnosis not present

## 2022-03-23 LAB — METHYLMALONIC ACID, SERUM: Methylmalonic Acid, Quantitative: 167 nmol/L (ref 0–378)

## 2022-03-23 NOTE — Progress Notes (Signed)
Progress Note    Bryan [name]  YQI:[id number] DOB: [birth year]  DOA: 03/19/2022 PCP: [name], MD      Brief Narrative:    Medical records reviewed and are as summarized below:  Bryan [name] is a 81 y.o. male with medical history significant for dementia, atrial fibrillation on Eliquis, chronic diastolic CHF, rheumatoid arthritis, hypertension, hyperlipidemia, who presented to the hospital with change in mental status different from his baseline.  [name], his stepdaughter, had gone to check on him and she noticed that patient was very confused.  She also found a spilled bottle of gabapentin near him.  Family was concerned that patient had been taking opioids and gabapentin together.  He was admitted to the hospital for probable acute toxic metabolic encephalopathy.  MRI brain did not show any evidence of acute intracranial abnormality.         Assessment/Plan:   Principal Problem:   Acute encephalopathy Active Problems:   Chronic diastolic CHF (congestive heart failure) (HCC)   Leukocytosis   Essential hypertension   Rheumatoid arthritis (HCC)   Paroxysmal atrial fibrillation (HCC)   Senile dementia uncomp, without behavioral disturbance (HCC)   Protein-calorie malnutrition, severe    Body mass index is 20.34 kg/m.  Probable acute toxic metabolic encephalopathy: Resolved.?  taking extra dose of gabapentin.  Appreciate recommendations from neurologist.  EEG done on 03/22/2022 was normal.  MRI brain did not show any evidence of acute abnormality.  Leukocytosis: Improved  Paroxysmal atrial fibrillation/atrial flutter: He is no longer on Eliquis because of falls and recent scalp hematoma in August 2023.  Dementia, recurrent falls: Continue Aricept.  [name], stepdaughter, is concerned that patient is no longer able to care for himself at home.  Continue donepezil.  Awaiting placement to SNF  Chronic diastolic CHF: No acute issues  Other  comorbidities include hypertension, rheumatoid arthritis  Diet Order             Diet 2 gram sodium Room service appropriate? Yes; Fluid consistency: Thin  Diet effective now                            Consultants: Neurologist  Procedures: None    Medications:    amLODipine  5 mg Oral Daily   atorvastatin  40 mg Oral Daily   donepezil  10 mg Oral QHS   enoxaparin (LOVENOX) injection  40 mg Subcutaneous QHS   feeding supplement  237 mL Oral BID BM   ferrous sulfate  325 mg Oral Q breakfast   FLUoxetine  20 mg Oral Daily   folic acid  1 mg Oral Daily   ipratropium  2 spray Each Nare QID   lisinopril  30 mg Oral Daily   loratadine  10 mg Oral Daily   melatonin  5 mg Oral QHS   multivitamin with minerals  1 tablet Oral Daily   pantoprazole  40 mg Oral Daily   thiamine  100 mg Oral Daily   traZODone  50 mg Oral QHS   Continuous Infusions:   Anti-infectives (From admission, onward)    None              Family Communication/Anticipated D/C date and plan/Code Status   DVT prophylaxis: enoxaparin (LOVENOX) injection 40 mg Start: 03/20/22 2200     Code Status: DNR  Family Communication: None Disposition Plan: Awaiting placement to SNF   Status is: Observation The patient will require  care spanning > 2 midnights and should be moved to inpatient because: Confusion, unsafe discharge plan       Subjective:   Interval events noted.  He feels well and has no complaints.  No shortness of breath or chest pain.  He feels he is back to his baseline.  He  Objective:    Vitals:   03/22/22 1702 03/22/22 2005 03/23/22 0531 03/23/22 1612  BP: (!) 147/71 (!) 142/81 (!) 156/92 108/60  Pulse: 85 84 73 88  Resp: '16 17 17 18  '$ Temp: 97.7 F (36.5 C) 97.9 F (36.6 C) 97.6 F (36.4 C) 98.1 F (36.7 C)  TempSrc:  Oral Oral Oral  SpO2: 98% 100% 100% 99%  Weight:      Height:       No data found.   Intake/Output Summary (Last 24 hours) at  03/23/2022 1643 Last data filed at 03/23/2022 1431 Gross per 24 hour  Intake 480 ml  Output 525 ml  Net -45 ml   Filed Weights   03/19/22 1327  Weight: 68 kg    Exam:  GEN: NAD SKIN: Warm and dry EYES: No pallor or icterus ENT: MMM CV: RRR PULM: CTA B ABD: soft, ND, NT, +BS CNS: AAO x 3, non focal EXT: No edema or tenderness      Data Reviewed:   I have personally reviewed following labs and imaging studies:  Labs: Labs show the following:   Basic Metabolic Panel: Recent Labs  Lab 03/19/22 1331 03/20/22 0537  NA 138 138  K 3.6 3.7  CL 102 103  CO2 26 26  GLUCOSE 122* 99  BUN 20 16  CREATININE 0.87 0.79  CALCIUM 9.8 9.6   GFR Estimated Creatinine Clearance: 67.3 mL/min (by C-G formula based on SCr of 0.79 mg/dL). Liver Function Tests: Recent Labs  Lab 03/19/22 1331  AST 37  ALT 15  ALKPHOS 96  BILITOT 0.7  PROT 8.0  ALBUMIN 4.4   No results for input(s): "LIPASE", "AMYLASE" in the last 168 hours. Recent Labs  Lab 03/19/22 1555  AMMONIA 26   Coagulation profile No results for input(s): "INR", "PROTIME" in the last 168 hours.  CBC: Recent Labs  Lab 03/19/22 1331 03/20/22 0537  WBC 11.5* 5.9  NEUTROABS 10.2*  --   HGB 10.7* 10.7*  HCT 33.9* 33.3*  MCV 93.4 91.2  PLT 140* 138*   Cardiac Enzymes: Recent Labs  Lab 03/19/22 1331  CKTOTAL 339   BNP (last 3 results) No results for input(s): "PROBNP" in the last 8760 hours. CBG: Recent Labs  Lab 03/19/22 1420  GLUCAP 118*   D-Dimer: No results for input(s): "DDIMER" in the last 72 hours. Hgb A1c: No results for input(s): "HGBA1C" in the last 72 hours. Lipid Profile: No results for input(s): "CHOL", "HDL", "LDLCALC", "TRIG", "CHOLHDL", "LDLDIRECT" in the last 72 hours. Thyroid function studies: No results for input(s): "TSH", "T4TOTAL", "T3FREE", "THYROIDAB" in the last 72 hours.  Invalid input(s): "FREET3"  Anemia work up: No results for input(s): "VITAMINB12",  "FOLATE", "FERRITIN", "TIBC", "IRON", "RETICCTPCT" in the last 72 hours.  Sepsis Labs: Recent Labs  Lab 03/19/22 1331 03/20/22 0537  PROCALCITON <0.10  --   WBC 11.5* 5.9  LATICACIDVEN 1.2  --     Microbiology Recent Results (from the past 240 hour(s))  Blood culture (single)     Status: None (Preliminary result)   Collection Time: 03/19/22  1:26 PM   Specimen: BLOOD  Result Value Ref Range Status  Specimen Description BLOOD BLOOD RIGHT ARM  Final   Special Requests   Final    BOTTLES DRAWN AEROBIC AND ANAEROBIC Blood Culture results may not be optimal due to an inadequate volume of blood received in culture bottles   Culture   Final    NO GROWTH 4 DAYS Performed at [facility], [address], [place], [place] [address]    Report Status PENDING  Incomplete    Procedures and diagnostic studies:  EEG adult  Result Date: 03/22/2022 Greta Doom, MD     03/22/2022  1:01 PM History: 81 year old male being evaluated for an episode of altered mental status Sedation: None Technique: This EEG was acquired with electrodes placed according to the International 10-20 electrode system (including Fp1, Fp2, F3, F4, C3, C4, P3, P4, O1, O2, T3, T4, T5, T6, A1, A2, Fz, Cz, Pz). The following electrodes were missing or displaced: none. Background: The background consists of intermixed alpha and beta activities. There is a well defined posterior dominant rhythm of 8-9 hz that attenuates with eye opening. Sleep is recorded with normal appearing structures. Photic stimulation: Physiologic driving is present EEG Abnormalities: None Clinical Interpretation: This normal EEG is recorded in the waking and sleep state. There was no seizure or seizure predisposition recorded on this study. Please note that lack of epileptiform activity on EEG does not preclude the possibility of epilepsy. Roland Rack, MD [facility] [phone] If 7pm- 7am, please page neurology on  call as listed in [place].               LOS: 0 days   Nishant Schrecengost  [facility]   Pager on [email or web address]. If 7PM-7AM, please contact night-coverage at [email or web address]     03/23/2022, 4:43 PM

## 2022-03-23 NOTE — TOC Progression Note (Signed)
Transition of Care [facility]) - Progression Note    Patient Details  Name: Bryan [name] MRN: [id number] Date of Birth: [birth year]  [facility] CM/SW Contact  Laurena Slimmer, RN Phone Number: 03/23/2022, 11:42 AM  Clinical Narrative:    Bed offers still pending. Will continue to monitor.    Expected Discharge Plan: [place] Barriers to Discharge: Continued Medical Work up, Ship broker  Expected Discharge Plan and Services Expected Discharge Plan: [place] In-house Referral: Clinical Social Work                                             Social Determinants of Health (SDOH) Interventions Housing Interventions: Intervention Not Indicated  Readmission Risk Interventions     No data to display

## 2022-03-24 DIAGNOSIS — G9340 Encephalopathy, unspecified: Secondary | ICD-10-CM | POA: Diagnosis not present

## 2022-03-24 LAB — CULTURE, BLOOD (SINGLE): Culture: NO GROWTH

## 2022-03-24 LAB — VITAMIN B1: Vitamin B1 (Thiamine): 152.5 nmol/L (ref 66.5–200.0)

## 2022-03-24 NOTE — Progress Notes (Signed)
Occupational Therapy Treatment Patient Details Name: Bryan [name] MRN: [id number] DOB: [birth year] Today's Date: 03/24/2022   History of present illness Bryan [name] is a 81 y.o. 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 Pt comes to [facility] for AMS. MRI unrevealing.   OT comments  Chart reviewed, pt greeted in bed agreeable to OT tx session. Pt is alert and oriented x4, fair safety awareness (for example reports he would call his sister if there were a fire in his house). Tx session targeted improving functional activity tolerance, ADL status. Improvements noted in toilet transfer, amb with RW with supervision-CGA with vcs required for safety. Pt is left in bedside chair, all needs met. OT will follow acutely.    Recommendations for follow up therapy are one component of a multi-disciplinary discharge planning process, led by the attending physician.  Recommendations may be updated based on patient status, additional functional criteria and insurance authorization.    Follow Up Recommendations  Skilled nursing-short term rehab (<3 hours/day)    Assistance Recommended at Discharge Frequent or constant Supervision/Assistance  Patient can return home with the following  A little help with walking and/or transfers;Assistance with cooking/housework;Assist for transportation;Help with stairs or ramp for entrance;Direct supervision/assist for medications management;Direct supervision/assist for financial management;A little help with bathing/dressing/bathroom   Equipment Recommendations  Other (comment) (per next venue of care)    Recommendations for Other Services      Precautions / Restrictions Precautions Precautions:  Fall Restrictions Weight Bearing Restrictions: No       Mobility Bed Mobility Overal bed mobility: Needs Assistance Bed Mobility: Supine to Sit     Supine to sit: Supervision, HOB elevated          Transfers Overall transfer level: Needs assistance Equipment used: Rolling walker (2 wheels) Transfers: Sit to/from Stand Sit to Stand: Supervision                 Balance Overall balance assessment: Needs assistance Sitting-balance support: Feet supported Sitting balance-Leahy Scale: Good     Standing balance support: Bilateral upper extremity supported Standing balance-Leahy Scale: Fair                             ADL either performed or assessed with clinical judgement   ADL Overall ADL's : Needs assistance/impaired     Grooming: Wash/dry hands;Standing;Supervision/safety         Lower Body Bathing Details (indicate cue type and reason): shoes Upper Body Dressing : Set up;Sitting Upper Body Dressing Details (indicate cue type and reason): rober Lower Body Dressing: Minimal assistance;Sitting/lateral leans Lower Body Dressing Details (indicate cue type and reason): shoes Toilet Transfer: Min guard;Regular Toilet;Rolling walker (2 wheels);Cueing for safety   Toileting- Clothing Manipulation and Hygiene: Supervision/safety;Sitting/lateral lean       Functional mobility during ADLs: Supervision/safety;Min guard;Rolling walker (2 wheels) (intermittent vcs for safety, use of RW)      Extremity/Trunk Assessment              Vision       Perception     Praxis      Cognition Arousal/Alertness: Awake/alert Behavior During Therapy: WFL for tasks assessed/performed Overall Cognitive Status: No family/caregiver present to determine baseline cognitive functioning Area of Impairment: Safety/judgement, Problem solving  Safety/Judgement: Decreased awareness of safety, Decreased awareness of deficits    Problem Solving: Slow processing, Requires verbal cues, Requires tactile cues, Decreased initiation          Exercises      Shoulder Instructions       General Comments      Pertinent Vitals/ Pain       Pain Assessment Pain Assessment: No/denies pain  Home Living                                          Prior Functioning/Environment              Frequency  Min 2X/week        Progress Toward Goals  OT Goals(current goals can now be found in the care plan section)  Progress towards OT goals: Progressing toward goals     Plan Discharge plan remains appropriate;Frequency remains appropriate    Co-evaluation                 AM-PAC OT "6 Clicks" Daily Activity     Outcome Measure   Help from another person eating meals?: None Help from another person taking care of personal grooming?: A Little Help from another person toileting, which includes using toliet, bedpan, or urinal?: A Little Help from another person bathing (including washing, rinsing, drying)?: A Little Help from another person to put on and taking off regular upper body clothing?: None Help from another person to put on and taking off regular lower body clothing?: A Little 6 Click Score: 20    End of Session Equipment Utilized During Treatment: Rolling walker (2 wheels)  OT Visit Diagnosis: Unsteadiness on feet (R26.81);Muscle weakness (generalized) (M62.81);Other symptoms and signs involving cognitive function   Activity Tolerance Patient tolerated treatment well   Patient Left in chair;with call bell/phone within reach;with chair alarm set   Nurse Communication          Time: 7473-4037 OT Time Calculation (min): 22 min  Charges: OT General Charges $OT Visit: 1 Visit OT Treatments $Therapeutic Activity: 8-22 mins  Shanon Payor, OTD OTR/L  03/24/22, 12:56 PM

## 2022-03-24 NOTE — TOC Progression Note (Signed)
Transition of Care [facility]) - Progression Note    Patient Details  Name: Bryan [name] MRN: [id number] Date of Birth: 01-Jul-1940  Transition of Care [facility]) CM/SW Contact  Laurena Slimmer, RN Phone Number: 03/24/2022, 4:38 PM  Clinical Narrative:    Attempt to reach Irma Newness, [place]. No answer . Left a message.    Expected Discharge Plan: [place] Barriers to Discharge: Continued Medical Work up, Ship broker  Expected Discharge Plan and Services Expected Discharge Plan: [place] In-house Referral: Clinical Social Work                                             Social Determinants of Health (SDOH) Interventions Housing Interventions: Intervention Not Indicated  Readmission Risk Interventions     No data to display

## 2022-03-24 NOTE — TOC Progression Note (Addendum)
Transition of Care [facility]) - Progression Note    Patient Details  Name: Bryan [name] MRN: [id number] Date of Birth: [birth year]  Transition of Care [facility]) CM/SW [place], RN Phone Number: 03/24/2022, 9:14 AM  Clinical Narrative:    No current bed offers. Bed search extended .    Expected Discharge Plan: [place] Barriers to Discharge: Continued Medical Work up, Ship broker  Expected Discharge Plan and Services Expected Discharge Plan: [place] In-house Referral: Clinical Social Work                                             Social Determinants of Health (SDOH) Interventions Housing Interventions: Intervention Not Indicated  Readmission Risk Interventions     No data to display

## 2022-03-24 NOTE — Progress Notes (Signed)
Physical Therapy Treatment Patient Details Name: Bryan [name] MRN: [id number] DOB: 1941/03/13 Today's Date: 03/24/2022   History of Present Illness Bryan [name] is a 81 y.o. 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 Pt comes to [facility] for AMS. MRI unrevealing.    PT Comments    Pt showed good effort with bed mobility, etc and apart from some minimal cuing did not need direct assist.  He was able to ambulate ~350 ft with relatively consistent cadence.  He made some short term improvements with posture, foot clearance, symmetry of cadence with cuing, but generally he reverted to safe but less efficient gait.  PT did adjust walker height to allow more efficient use of UE, pt looked better and subjectively noted improvement with the change.  Pt did not wish to do strengthening or balance exercises after ambulation, nor did he wish to stay up in the recliner for dinner (states he spent some time up in recliner earlier today.)  Pt moving well and making nice improvements, mental status appears to have improved since arrival, pt reports feeling back to his normal...  Recommendations for follow up therapy are one component of a multi-disciplinary discharge planning process, led by the attending physician.  Recommendations may be updated based on patient status, additional functional criteria and insurance authorization.  Follow Up Recommendations  Follow physician's recommendations for discharge plan and follow up therapies Can patient physically be transported by private vehicle: Yes   Assistance Recommended at Discharge Set up Supervision/Assistance  Patient can return home with the following A little help with bathing/dressing/bathroom;Assistance  with cooking/housework;Assist for transportation;Help with stairs or ramp for entrance   Equipment Recommendations  None recommended by PT    Recommendations for Other Services       Precautions / Restrictions Precautions Precautions: Fall Restrictions Weight Bearing Restrictions: No     Mobility  Bed Mobility Overal bed mobility: Modified Independent Bed Mobility: Supine to Sit     Supine to sit: Supervision, HOB elevated Sit to supine: Supervision   General bed mobility comments: pt able to rise to sitting w/o hesitation, did need assist with donning slippers    Transfers Overall transfer level: Modified independent Equipment used: Rolling walker (2 wheels) Transfers: Sit to/from Stand Sit to Stand: Supervision           General transfer comment: appropraite use of UEs, no assist to attain standing - posture remains forward flexed    Ambulation/Gait Ambulation/Gait assistance: Supervision Gait Distance (Feet): 350 Feet Assistive device: Rolling walker (2 wheels)         General Gait Details: Pt was able to do 2 full loops around the nurses' station w/o issue.  He did not need rest breaks (though HR was >120, O2 remained in the high 90s), had no LOBs or overt unsteadiness and overall did well.  PT did adjust walker height with improved foot clearance and improvement on ability to sustain some weight through UEs.   Stairs             Wheelchair Mobility    Modified Rankin (Stroke Patients Only)       Balance Overall balance assessment: Needs assistance Sitting-balance support: Feet supported Sitting balance-Leahy Scale: Good     Standing balance support: Bilateral upper extremity supported Standing balance-Leahy Scale: Fair  Cognition Arousal/Alertness: Awake/alert Behavior During Therapy: WFL for tasks assessed/performed Overall Cognitive Status: Within Functional Limits for tasks assessed                                  General Comments: unsure of baseline and how far from it he was on arrival but pt with good awareness, ability to converse, form/find words, etc appear functional and presumably near baseline        Exercises      General Comments        Pertinent Vitals/Pain Pain Assessment Pain Assessment: No/denies pain    Home Living                          Prior Function            PT Goals (current goals can now be found in the care plan section) Progress towards PT goals: Progressing toward goals    Frequency    Min 2X/week      PT Plan Current plan remains appropriate    Co-evaluation              AM-PAC PT "6 Clicks" Mobility   Outcome Measure  Help needed turning from your back to your side while in a flat bed without using bedrails?: None Help needed moving from lying on your back to sitting on the side of a flat bed without using bedrails?: None Help needed moving to and from a bed to a chair (including a wheelchair)?: None Help needed standing up from a chair using your arms (e.g., wheelchair or bedside chair)?: None Help needed to walk in hospital room?: None Help needed climbing 3-5 steps with a railing? : A Little 6 Click Score: 23    End of Session Equipment Utilized During Treatment: Gait belt Activity Tolerance: No increased pain;Patient limited by fatigue Patient left: in bed;with call bell/phone within reach;with nursing/sitter in room;with bed alarm set Nurse Communication: Mobility status PT Visit Diagnosis: Other abnormalities of gait and mobility (R26.89);Difficulty in walking, not elsewhere classified (R26.2);Other symptoms and signs involving the nervous system (J82.505)     Time: 3976-7341 PT Time Calculation (min) (ACUTE ONLY): 12 min  Charges:  $Gait Training: 8-22 mins                     Kreg Shropshire, DPT 03/24/2022, 5:50 PM

## 2022-03-24 NOTE — Progress Notes (Signed)
Progress Note    Bryan [name]  KYH:[id number] DOB: [birth year]  DOA: 03/19/2022 PCP: Bryan Haven, MD      Brief Narrative:    Medical records reviewed and are as summarized below:  Bryan [name] is a 81 y.o. male with medical history significant for dementia, atrial fibrillation on Eliquis, chronic diastolic CHF, rheumatoid arthritis, hypertension, hyperlipidemia, who presented to the hospital with change in mental status different from his baseline.  Bryan [name], his stepdaughter, had gone to check on him and she noticed that patient was very confused.  She also found a spilled bottle of gabapentin near him.  Family was concerned that patient had been taking opioids and gabapentin together.  He was admitted to the hospital for probable acute toxic metabolic encephalopathy.  MRI brain did not show any evidence of acute intracranial abnormality.         Assessment/Plan:   Principal Problem:   Acute encephalopathy Active Problems:   Chronic diastolic CHF (congestive heart failure) (HCC)   Leukocytosis   Essential hypertension   Rheumatoid arthritis (HCC)   Paroxysmal atrial fibrillation (HCC)   Senile dementia uncomp, without behavioral disturbance (HCC)   Protein-calorie malnutrition, severe    Body mass index is 20.34 kg/m.  Acute toxic metabolic encephalopathy: Resolved. Suspected due to mistakenly taking extra gabapentin.  Appreciate recommendations from neurologist.  EEG done on 03/22/2022 was normal.  MRI brain did not show any evidence of acute abnormality.  Leukocytosis: Improved  Paroxysmal atrial fibrillation/atrial flutter: He is no longer on Eliquis because of falls and recent scalp hematoma in August 2023.  Dementia, recurrent falls: Continue Aricept.  Bryan [name], stepdaughter, is concerned that patient is no longer able to care for himself at home.  Continue donepezil.  Awaiting placement to SNF  Chronic diastolic CHF: No acute issues  Other  comorbidities include hypertension, rheumatoid arthritis - stable.  Continue present medications.  Diet Order             Diet 2 gram sodium Room service appropriate? Yes; Fluid consistency: Thin  Diet effective now                   Consultants: Neurologist  Procedures: None    Medications:    amLODipine  5 mg Oral Daily   atorvastatin  40 mg Oral Daily   donepezil  10 mg Oral QHS   enoxaparin (LOVENOX) injection  40 mg Subcutaneous QHS   feeding supplement  237 mL Oral BID BM   ferrous sulfate  325 mg Oral Q breakfast   FLUoxetine  20 mg Oral Daily   folic acid  1 mg Oral Daily   ipratropium  2 spray Each Nare QID   lisinopril  30 mg Oral Daily   loratadine  10 mg Oral Daily   melatonin  5 mg Oral QHS   multivitamin with minerals  1 tablet Oral Daily   pantoprazole  40 mg Oral Daily   thiamine  100 mg Oral Daily   traZODone  50 mg Oral QHS   Continuous Infusions:   Anti-infectives (From admission, onward)    None        Family Communication/Anticipated D/C date and plan/Code Status   DVT prophylaxis: enoxaparin (LOVENOX) injection 40 mg Start: 03/20/22 2200     Code Status: DNR  Family Communication: None Disposition Plan: Awaiting placement to SNF   Status is: Observation The patient will require care spanning > 2 midnights and should be moved  to inpatient because: Confusion/dementia, unable to care for self, unsafe discharge plan (awaiting SNF/rehab placement).    Subjective:   Pt was sleeping but woke easily to voice.  He denied any acute complaints, state he feels well.  Slept well overnight.  Ate good breakfast.  No acute events reported.  He expresses being agreeable to rehab.   Objective:    Vitals:   03/23/22 1612 03/23/22 2105 03/24/22 0500 03/24/22 0753  BP: 108/60 (!) 140/71 137/88 130/74  Pulse: 88 (!) 13 83 84  Resp: '18 18 18   '$ Temp: 98.1 F (36.7 C) 98.2 F (36.8 C) 98.7 F (37.1 C) (!) 97.4 F (36.3 C)  TempSrc:  Oral Oral Oral Oral  SpO2: 99% 100% 100% 100%  Weight:      Height:       Filed Weights   03/19/22 1327  Weight: 68 kg    Exam:  General exam: awake, alert, no acute distress HEENT: atraumatic, clear conjunctiva, anicteric sclera, moist mucus membranes, hearing grossly normal  Respiratory system: CTAB, no wheezes, rales or rhonchi, normal respiratory effort. Cardiovascular system: normal S1/S2, RRR, no JVD, murmurs, rubs, gallops, no pedal edema.   Gastrointestinal system: soft, NT, ND, no HSM felt, +bowel sounds. Central nervous system: no gross focal neurologic deficits, normal speech Extremities: arthritis changes in b/l hands with joint hypertrophy, no edema, normal tone Skin: dry, intact, normal temperature, normal color, No rashes, lesions or ulcers Psychiatry: normal mood, congruent affect    Data Reviewed:   Labs & Data reviewed: No new labs today.      LOS: 0 days   Ezekiel Slocumb, DO  [facility]   Pager on [email or web address]. If 7PM-7AM, please contact night-coverage at [email or web address]     03/24/2022, 2:36 PM

## 2022-03-25 ENCOUNTER — Telehealth: Payer: Self-pay | Admitting: Cardiovascular Disease

## 2022-03-25 ENCOUNTER — Encounter: Payer: Self-pay | Admitting: Family Medicine

## 2022-03-25 DIAGNOSIS — G9340 Encephalopathy, unspecified: Secondary | ICD-10-CM | POA: Diagnosis not present

## 2022-03-25 MED ORDER — ENSURE ENLIVE PO LIQD: 237.0000 mL | Freq: Two times a day (BID) | ORAL | 12 refills | Status: DC

## 2022-03-25 MED ORDER — GABAPENTIN 100 MG PO CAPS: 100.0000 mg | ORAL_CAPSULE | Freq: Three times a day (TID) | ORAL | 0 refills | Status: DC

## 2022-03-25 MED ORDER — MELATONIN 5 MG PO TABS: 5.0000 mg | ORAL_TABLET | Freq: Every evening | ORAL | 0 refills | Status: DC | PRN

## 2022-03-25 MED ORDER — LOPERAMIDE HCL 2 MG PO CAPS: 2.0000 mg | ORAL_CAPSULE | ORAL | 0 refills | Status: DC | PRN

## 2022-03-25 NOTE — Progress Notes (Signed)
Nutrition Follow-up  DOCUMENTATION CODES:   Severe malnutrition in context of chronic illness  INTERVENTION:   -Continue MVI with minerals daily -Continue Ensure Enlive po BID, each supplement provides 350 kcal and 20 grams of protein  NUTRITION DIAGNOSIS:   Severe Malnutrition related to chronic illness (dementia) as evidenced by severe fat depletion, moderate fat depletion, moderate muscle depletion, severe muscle depletion.  Ongoing  GOAL:   Patient will meet greater than or equal to 90% of their needs  Progressing   MONITOR:   PO intake, Supplement acceptance  REASON FOR ASSESSMENT:   Malnutrition Screening Tool    ASSESSMENT:   Pt with medical history significant for dementia, atrial fibrillation on Eliquis, chronic diastolic CHF, rheumatoid arthritis, hypertension, hyperlipidemia, who presented to the hospital with change in mental status different from his baseline.  Reviewed I/O's: +375 ml x 24 hours and +70 ml since admission  UOP: 225 ml x 24 hours   Pt sleeping soundly at time of visit. Observed breakfast tray- pt consumed 100%. Noted meal completions 75-100%. Per RN, confirms good intake of meals and consuming Ensure supplements.   Per TOC notes, likely plan to discharge home today.   Medications reviewed and include folic acid and melatonin.   Labs reviewed: CBGS: 118.   Diet Order:   Diet Order             Diet 2 gram sodium Room service appropriate? Yes; Fluid consistency: Thin  Diet effective now                   EDUCATION NEEDS:   Education needs have been addressed  Skin:  Skin Assessment: Reviewed RN Assessment  Last BM:  03/23/22  Height:   Ht Readings from Last 1 Encounters:  03/19/22 6' (1.829 m)    Weight:   Wt Readings from Last 1 Encounters:  03/19/22 68 kg    Ideal Body Weight:  80.9 kg  BMI:  Body mass index is 20.34 kg/m.  Estimated Nutritional Needs:   Kcal:  2050-2250  Protein:  105-120  grams  Fluid:  > 2 L    Loistine Chance, RD, LDN, [place] Registered Dietitian II Certified Diabetes Care and Education Specialist Please refer to [facility] for RD and/or RD on-call/weekend/after hours pager

## 2022-03-25 NOTE — Telephone Encounter (Signed)
Patient's wife would like to know when the patient needs to come in for repeat labs on Eliquis. She states she recalls Dr. [name] advising to come back in mid-November, but she is not certain. Please advise.

## 2022-03-25 NOTE — TOC Transition Note (Signed)
Transition of Care [facility]) - CM/SW Discharge Note   Patient Details  Name: Bryan [name] MRN: [id number] Date of Birth: 12-06-1940  Transition of [facility]) CM/SW Contact:  Laurena Slimmer, RN Phone Number: 03/25/2022, 4:37 PM   Clinical Narrative:    Attempt to secure [facility] OT/ SW. Sent referral to [facility] from [place]. Referral declined.    Final next level of care: Home/Self Care Barriers to Discharge: Barriers Resolved   Patient Goals and CMS Choice Patient states their goals for this hospitalization and ongoing recovery are:: Home CMS Medicare.gov Compare Post Acute Care list provided to:: Other (Comment Required) (Sister, Baker Janus) Choice offered to / list presented to : Sibling  Discharge Placement                       Discharge Plan and Services In-house Referral: Clinical Social Work                                   Social Determinants of Health (SDOH) Interventions Housing Interventions: Intervention Not Indicated   Readmission Risk Interventions     No data to display

## 2022-03-25 NOTE — TOC Progression Note (Signed)
Transition of Care [facility]) - Progression Note    Patient Details  Name: Bryan [name] MRN: [id number] Date of Birth: May 14, 1941  Transition of Care [facility]) CM/SW Contact  Laurena Slimmer, RN Phone Number: 03/25/2022, 10:35 AM  Clinical Narrative:    Spoke with patient's sister Irma Newness regarding discharge plans. Baker Janus advised of new recommendation for home disposition. She is agreeable to discharge plan. Baker Janus is in the process of arranging a PCS aide to assist patient in his home. She and her husband will transport patient home. She was advised of likely home discharge today per MD correspondence with RNCM.    Expected Discharge Plan: [place] Barriers to Discharge: Continued Medical Work up, Ship broker  Expected Discharge Plan and Services Expected Discharge Plan: [place] In-house Referral: Clinical Social Work                                             Social Determinants of Health (SDOH) Interventions Housing Interventions: Intervention Not Indicated  Readmission Risk Interventions     No data to display

## 2022-03-25 NOTE — Telephone Encounter (Signed)
Attempted to call Bryan [name]. No answer- I left a message to please call back.

## 2022-03-25 NOTE — Progress Notes (Signed)
Mobility Specialist - Progress Note   03/25/22 1002  Mobility  Activity Ambulated with assistance in room  Level of Assistance Standby assist, set-up cues, supervision of patient - no hands on  Assistive Device Front wheel walker  Distance Ambulated (ft) 5 ft  Activity Response Tolerated well  $Mobility charge 1 Mobility   Pt OOB in restroom upon arrival. Pt ambulates back to bed SBA. Pt dons gown MinA. Pt left in bed with needs in reach and bed alarm set.   Gretchen Short  Mobility Specialist  03/25/22 10:03 AM

## 2022-03-25 NOTE — Assessment & Plan Note (Signed)
Related to chronic illness (dementia), as evidenced by  severe fat depletion, moderate fat depletion, moderate muscle depletion, severe muscle depletion.  --Appreciate dietitian recommendations --Started Ensure supplement drinks --Continue multivitamin

## 2022-03-25 NOTE — Discharge Summary (Signed)
Physician Discharge Summary   Patient: Bryan [name] MRN: [id number] DOB: 1940/11/07  Admit date:     03/19/2022  Discharge date: 03/25/22  Discharge Physician: Ezekiel Slocumb   PCP: [name], MD   Recommendations at discharge:   Follow up with Primary Care in 1-2 weeks Repeat CBC, BMP, Mg in 1-2 weeks Gabapentin dose was reduced (there was suspicion from family that pt mistakenly took more than prescribed).  Monitor for side effects.  Minimize polypharmacy.  Discharge Diagnoses: Principal Problem:   Acute encephalopathy Active Problems:   Chronic diastolic CHF (congestive heart failure) (HCC)   Leukocytosis   Essential hypertension   Rheumatoid arthritis (HCC)   Paroxysmal atrial fibrillation (HCC)   Senile dementia uncomp, without behavioral disturbance (HCC)   Protein-calorie malnutrition, severe  Resolved Problems:   * No resolved hospital problems. *  Hospital Course: Bryan [name] is a 81 y.o. male with medical history significant for dementia, atrial fibrillation on Eliquis, chronic diastolic CHF, rheumatoid arthritis, hypertension, hyperlipidemia, who presented to the hospital with change in mental status different from his baseline.  Bryan [name], his stepdaughter, had gone to check on him and she noticed that patient was very confused.  She also found a spilled bottle of gabapentin near him.  Family was concerned that patient had been taking opioids and gabapentin together.   He was admitted to the hospital for probable acute toxic metabolic encephalopathy.  MRI brain did not show any evidence of acute intracranial abnormality.      Assessment and Plan:    Acute toxic metabolic encephalopathy: Resolved.  Suspected due to mistakenly taking extra gabapentin.  Appreciate recommendations from neurologist.  EEG done on 03/22/2022 was normal.  MRI brain did not show any evidence of acute abnormality.   Leukocytosis: Improved   Paroxysmal atrial  fibrillation/atrial flutter: He is no longer on Eliquis because of falls and recent scalp hematoma in August 2023.   Dementia, recurrent falls: Continue Aricept.  Bryan [name], stepdaughter, is concerned that patient is no longer able to care for himself at home.  Continue donepezil.  Awaiting placement to SNF   Chronic diastolic CHF: No acute issues  Severe malnutrition - due to chronic illness, dementia, evidenced by severe fat and muscle depletion.  Seen by dietitian.  Started Ensure drinks.  Multivitamin.  Encourage PO intake.   Body mass index is 20.34 kg/m.   Other comorbidities include hypertension, rheumatoid arthritis - stable.   Continue present medications.   11/2: patient medically stable for discharge.  He has progressed well with PT and OT, not considered safe for discharge home with assistance of family and caregiver / home health aide to oversee medications and assists PRN with ADL's.         Consultants: None Procedures performed: NOne  Disposition: Home Diet recommendation:  Discharge Diet Orders (From admission, onward)     Start     Ordered   03/25/22 0000  Diet - low sodium heart healthy        03/25/22 1338           Cardiac diet DISCHARGE MEDICATION: Allergies as of 03/25/2022       Reactions   Penicillins Hives, Swelling, Other (See Comments)   SWELLING REACTION UNSPECIFIED PATIENT HAS TAKEN AMOXICILLIN ON MED HX FROM [facility] PATIENT HAS HAD A PCN REACTION WITH IMMEDIATE RASH, FACIAL/TONGUE/THROAT SWELLING, SOB, OR LIGHTHEADEDNESS WITH HYPOTENSION:  #  #  #  YES  #  #  #   Has  patient had a PCN reaction causing severe rash involving mucus membranes or skin necrosis: No Has patient had a PCN reaction that required hospitalization No Has patient had a PCN reaction occurring within the last 10 years: No   Demerol [meperidine] Hives, Nausea And Vomiting   Other         Medication List     STOP taking these medications    fenofibrate 160 MG tablet        TAKE these medications    amLODipine 5 MG tablet Commonly known as: NORVASC TAKE 1 TABLET BY MOUTH DAILY.   atorvastatin 40 MG tablet Commonly known as: LIPITOR TAKE 1 TABLET BY MOUTH DAILY   calcium carbonate 1250 (500 Ca) MG tablet Commonly known as: OS-CAL - dosed in mg of elemental calcium Take 1 tablet by mouth daily with lunch.   cyanocobalamin 1000 MCG tablet Commonly known as: VITAMIN B12 Take 1,000 mcg by mouth daily with lunch.   donepezil 10 MG tablet Commonly known as: ARICEPT Take 1 tablet by mouth at bedtime.   feeding supplement Liqd Take 237 mLs by mouth 2 (two) times daily between meals.   ferrous sulfate 325 (65 FE) MG tablet Take by mouth.   FLUoxetine 20 MG capsule Commonly known as: PROZAC TAKE 1 CAPSULE BY MOUTH ONCE DAILY   gabapentin 100 MG capsule Commonly known as: NEURONTIN Take 1 capsule (100 mg total) by mouth 3 (three) times daily. What changed:  medication strength how much to take   HYDROcodone-acetaminophen 10-325 MG tablet Commonly known as: NORCO Take 1 tablet by mouth 4 (four) times daily as needed for moderate pain.   hydrocortisone cream 1 % Apply 1 application topically daily as needed for itching.   ipratropium 0.06 % nasal spray Commonly known as: ATROVENT Place 2 sprays into both nostrils 4 (four) times daily.   lisinopril 20 MG tablet Commonly known as: ZESTRIL Take 30 mg by mouth daily.   loperamide 2 MG capsule Commonly known as: IMODIUM Take 1 capsule (2 mg total) by mouth as needed for diarrhea or loose stools. What changed:  how much to take when to take this reasons to take this   loratadine 10 MG tablet Commonly known as: CLARITIN Take 10 mg by mouth daily.   melatonin 5 MG Tabs Take 1 tablet (5 mg total) by mouth at bedtime as needed.   omeprazole 40 MG capsule Commonly known as: PRILOSEC TAKE 1 CAPSULE EVERY DAY   PreserVision AREDS 2 Caps Take 1 capsule by mouth daily.   Vitamin D  (Ergocalciferol) 1.25 MG (50000 UNIT) Caps capsule Commonly known as: DRISDOL Take 1 capsule by mouth on Monday and Thursday.   vitamin E 180 MG (400 UNITS) capsule Take 800 Units by mouth daily with lunch.        Follow-up Information     [facility]. Schedule an appointment as soon as possible for a visit in 1 month(s).   Contact information: [address] [place] [phone]               Discharge Exam: Danley Danker Weights   03/19/22 1327  Weight: 68 kg   General exam: awake, alert, no acute distress, underweight HEENT: atraumatic, clear conjunctiva, anicteric sclera, moist mucus membranes, hearing grossly normal  Respiratory system: CTAB, no wheezes, rales or rhonchi, normal respiratory effort. Cardiovascular system: normal S1/S2, RRR, no JVD, murmurs, rubs, gallops,  no pedal edema.   Gastrointestinal system: soft, NT,  ND, no HSM felt, +bowel sounds. Central nervous system: A&O x self and hospital . no gross focal neurologic deficits, normal speech Extremities: moves all, no edema, normal tone Skin: dry, intact, normal temperature, normal color, No rashes, lesions or ulcers Psychiatry: normal mood, congruent affect, judgement and insight appear normal   Condition at discharge: stable  The results of significant diagnostics from this hospitalization (including imaging, microbiology, ancillary and laboratory) are listed below for reference.   Imaging Studies: EEG adult  Result Date: 03-26-22 Greta Doom, MD     03-26-22  1:01 PM History: 81 year old male being evaluated for an episode of altered mental status Sedation: None Technique: This EEG was acquired with electrodes placed according to the International 10-20 electrode system (including Fp1, Fp2, F3, F4, C3, C4, P3, P4, O1, O2, T3, T4, T5, T6, A1, A2, Fz, Cz, Pz). The following electrodes were missing or displaced: none. Background: The  background consists of intermixed alpha and beta activities. There is a well defined posterior dominant rhythm of 8-9 hz that attenuates with eye opening. Sleep is recorded with normal appearing structures. Photic stimulation: Physiologic driving is present EEG Abnormalities: None Clinical Interpretation: This normal EEG is recorded in the waking and sleep state. There was no seizure or seizure predisposition recorded on this study. Please note that lack of epileptiform activity on EEG does not preclude the possibility of epilepsy. Roland Rack, MD [facility] [phone] If 7pm- 7am, please page neurology on call as listed in [place].   ECHOCARDIOGRAM COMPLETE  Result Date: 03/20/2022    ECHOCARDIOGRAM REPORT   Patient Name:   RICCARDO HOLEMAN Date of Exam: 03/20/2022 Medical Rec #:  [id number]            Height:       72.0 in Accession #:    [id number]           Weight:       150.0 lb Date of Birth:  1940-11-07             BSA:          1.885 m Patient Age:    40 years             BP:           174/85 mmHg Patient Gender: M                    HR:           67 bpm. Exam Location:  [facility] Procedure: 2D Echo, Cardiac Doppler and Color Doppler Indications:     Acute encephalopathy  History:         Patient has prior history of Echocardiogram examinations, most                  recent 04/28/2021. CAD, Pulmonary HTN, Arrythmias:Atrial                  Fibrillation, Signs/Symptoms:Murmur; Risk Factors:Hypertension,                  Dyslipidemia and Former Smoker. 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  Sonographer:     Wenda Low Referring Phys:  [id number] Jose Persia Diagnosing Phys: [place]  1. Left ventricular ejection fraction, by estimation, is 60 to 65%. The left ventricle has normal function. The left ventricle has  no regional wall motion abnormalities. The left ventricular internal cavity size was mildly  dilated. There is mild concentric left ventricular hypertrophy. Left ventricular diastolic parameters are indeterminate.  2. Right ventricular systolic function is mildly reduced. The right ventricular size is moderately enlarged. There is mildly elevated pulmonary artery systolic pressure.  3. Left atrial size was severely dilated.  4. Right atrial size was moderately dilated.  5. The mitral valve is degenerative. Mild to moderate mitral valve regurgitation. No evidence of mitral stenosis. Moderate to severe mitral annular calcification.  6. Tricuspid valve regurgitation is moderate.  7. The aortic valve is calcified. Aortic valve regurgitation is mild to moderate. Mild to moderate aortic valve stenosis.  8. The inferior vena cava is normal in size with greater than 50% respiratory variability, suggesting right atrial pressure of 3 mmHg. FINDINGS  Left Ventricle: Left ventricular ejection fraction, by estimation, is 60 to 65%. The left ventricle has normal function. The left ventricle has no regional wall motion abnormalities. The left ventricular internal cavity size was mildly dilated. There is  mild concentric left ventricular hypertrophy. Left ventricular diastolic parameters are indeterminate. Right Ventricle: The right ventricular size is moderately enlarged. No increase in right ventricular wall thickness. Right ventricular systolic function is mildly reduced. There is mildly elevated pulmonary artery systolic pressure. The tricuspid regurgitant velocity is 2.69 m/s, and with an assumed right atrial pressure of 8 mmHg, the estimated right ventricular systolic pressure is 29.5 mmHg. Left Atrium: Left atrial size was severely dilated. Right Atrium: Right atrial size was moderately dilated. Pericardium: There is no evidence of pericardial effusion. Mitral Valve: The mitral valve is degenerative in appearance. There is mild calcification of the mitral valve leaflet(s). Moderate to severe mitral annular  calcification. Mild to moderate mitral valve regurgitation. No evidence of mitral valve stenosis. MV peak gradient, 2.6 mmHg. The mean mitral valve gradient is 1.0 mmHg. Tricuspid Valve: The tricuspid valve is normal in structure. Tricuspid valve regurgitation is moderate . No evidence of tricuspid stenosis. Aortic Valve: The aortic valve is calcified. Aortic valve regurgitation is mild to moderate. Mild to moderate aortic stenosis is present. Aortic valve mean gradient measures 9.5 mmHg. Aortic valve peak gradient measures 21.2 mmHg. Aortic valve area, by VTI measures 1.84 cm. Pulmonic Valve: The pulmonic valve was normal in structure. Pulmonic valve regurgitation is trivial. No evidence of pulmonic stenosis. Aorta: The aortic root is normal in size and structure. Venous: The inferior vena cava is normal in size with greater than 50% respiratory variability, suggesting right atrial pressure of 3 mmHg. IAS/Shunts: No atrial level shunt detected by color flow Doppler.  LEFT VENTRICLE PLAX 2D LVIDd:         3.70 cm LVIDs:         2.50 cm LV PW:         1.50 cm LV IVS:        1.20 cm LVOT diam:     2.00 cm LV SV:         71 LV SV Index:   38 LVOT Area:     3.14 cm  RIGHT VENTRICLE RV Basal diam:  4.50 cm RV Mid diam:    3.60 cm RV S prime:     12.40 cm/s LEFT ATRIUM              Index        RIGHT ATRIUM           Index LA diam:  4.10 cm  2.17 cm/m   RA Area:     20.70 cm LA Vol (A2C):   74.7 ml  39.62 ml/m  RA Volume:   60.00 ml  31.82 ml/m LA Vol (A4C):   108.0 ml 57.28 ml/m LA Biplane Vol: 88.6 ml  46.99 ml/m  AORTIC VALVE AV Area (Vmax):    1.91 cm AV Area (Vmean):   1.84 cm AV Area (VTI):     1.84 cm AV Vmax:           230.00 cm/s AV Vmean:          136.500 cm/s AV VTI:            0.388 m AV Peak Grad:      21.2 mmHg AV Mean Grad:      9.5 mmHg LVOT Vmax:         139.50 cm/s LVOT Vmean:        79.750 cm/s LVOT VTI:          0.228 m LVOT/AV VTI ratio: 0.59  AORTA Ao Root diam: 4.30 cm MITRAL VALVE                TRICUSPID VALVE MV Area (PHT): 3.97 cm    TR Peak grad:   28.9 mmHg MV Area VTI:   3.51 cm    TR Vmax:        269.00 cm/s MV Peak grad:  2.6 mmHg MV Mean grad:  1.0 mmHg    SHUNTS MV Vmax:       0.80 m/s    Systemic VTI:  0.23 m MV Vmean:      42.4 cm/s   Systemic Diam: 2.00 cm MV Decel Time: 191 msec MV E velocity: 79.70 cm/s Neoma Laming Electronically signed by Neoma Laming Signature Date/Time: 03/20/2022/11:14:14 AM    Final    MR BRAIN WO CONTRAST  Result Date: 03/19/2022 CLINICAL DATA:  Mental status change, unknown cause. EXAM: MRI HEAD WITHOUT CONTRAST TECHNIQUE: Multiplanar, multiecho pulse sequences of the brain and surrounding structures were obtained without intravenous contrast. COMPARISON:  Head CT March 19, 2022. FINDINGS: The study is partially degraded by motion. Brain: No acute infarction, hemorrhage, hydrocephalus, extra-axial collection or mass lesion. Scattered and confluent foci of T2 hyperintensity are seen within the white matter of the cerebral hemispheres, nonspecific, most likely related to chronic small vessel ischemia. Mild parenchymal volume loss. 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 Vascular: Normal flow voids. Skull and upper cervical spine: Normal marrow signal. Sinuses/Orbits: Negative. Other: None. IMPRESSION: 1. No acute intracranial abnormality. 2. Moderate chronic microvascular ischemic changes of the white matter. 3. 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 Electronically Signed   By: Pedro Earls M.D.   On: 03/19/2022 17:22   CT Head Wo Contrast  Result Date: 03/19/2022 CLINICAL DATA:  Mental status change, unknown cause EXAM: CT HEAD WITHOUT CONTRAST TECHNIQUE: Contiguous axial images were obtained from the base of the skull through the vertex without intravenous contrast. RADIATION DOSE REDUCTION: This exam was performed according to the departmental dose-optimization program which  includes automated exposure control, adjustment of the mA and/or kV according to patient size and/or use of iterative reconstruction technique. COMPARISON:  CT head 12/14/2021. FINDINGS: Brain: No evidence of acute infarction, hemorrhage, hydrocephalus, extra-axial collection or mass lesion/mass effect. Patchy white matter hypodensities, nonspecific but compatible with chronic microvascular ischemic disease. Vascular: No hyperdense vessel identified. Skull: No acute fracture. Sinuses/Orbits: Clear sinuses.  No  acute orbital findings. Other: No mastoid effusions. IMPRESSION: No evidence of acute intracranial abnormality. Electronically Signed   By: Margaretha Sheffield M.D.   On: 03/19/2022 14:33   DG Chest Portable 1 View  Result Date: 03/19/2022 CLINICAL DATA:  Altered mental status EXAM: PORTABLE CHEST 1 VIEW COMPARISON:  11/12/2019 FINDINGS: Prior median sternotomy. Unchanged cardiomegaly. Patchy left mid peripheral opacities. No pleural effusion. No evidence of pneumothorax. No acute osseous abnormality. Thoracolumbar fusion hardware noted. Thoracic spondylosis. Multiple and mid to lower thoracic compression deformities and vertebroplasties. IMPRESSION: Faint peripheral left mid lung opacities, could be scarring or developing infection. Recommend short-term radiographic follow-up. Electronically Signed   By: Maurine Simmering M.D.   On: 03/19/2022 14:01    Microbiology: Results for orders placed or performed during the hospital encounter of 03/19/22  Blood culture (single)     Status: None   Collection Time: 03/19/22  1:26 PM   Specimen: BLOOD  Result Value Ref Range Status   Specimen Description BLOOD BLOOD RIGHT ARM  Final   Special Requests   Final    BOTTLES DRAWN AEROBIC AND ANAEROBIC Blood Culture results may not be optimal due to an inadequate volume of blood received in culture bottles   Culture   Final    NO GROWTH 5 DAYS Performed at [facility], [address] [place], [place]  [id number]    Report Status 03/24/2022 FINAL  Final   *Note: Due to a large number of results and/or encounters for the requested time period, some results have not been displayed. A complete set of results can be found in Results Review.    Labs: CBC: Recent Labs  Lab 03/19/22 1331 03/20/22 0537  WBC 11.5* 5.9  NEUTROABS 10.2*  --   HGB 10.7* 10.7*  HCT 33.9* 33.3*  MCV 93.4 91.2  PLT 140* 791*   Basic Metabolic Panel: Recent Labs  Lab 03/19/22 1331 03/20/22 0537  NA 138 138  K 3.6 3.7  CL 102 103  CO2 26 26  GLUCOSE 122* 99  BUN 20 16  CREATININE 0.87 0.79  CALCIUM 9.8 9.6   Liver Function Tests: Recent Labs  Lab 03/19/22 1331  AST 37  ALT 15  ALKPHOS 96  BILITOT 0.7  PROT 8.0  ALBUMIN 4.4   CBG: Recent Labs  Lab 03/19/22 1420  GLUCAP 118*    Discharge time spent: less than 30 minutes.  Signed: Ezekiel Slocumb, DO [facility] 03/25/2022

## 2022-03-26 ENCOUNTER — Ambulatory Visit: Payer: Medicare (Managed Care) | Admitting: Family Medicine

## 2022-03-26 ENCOUNTER — Encounter: Payer: Self-pay | Admitting: Cardiovascular Disease

## 2022-03-29 LAB — MISC LABCORP TEST (SEND OUT): Labcorp test code: 716811

## 2022-03-30 ENCOUNTER — Ambulatory Visit: Payer: Medicare (Managed Care) | Admitting: Family Medicine

## 2022-04-01 ENCOUNTER — Telehealth: Payer: Self-pay | Admitting: Family Medicine

## 2022-04-01 DIAGNOSIS — R918 Other nonspecific abnormal finding of lung field: Secondary | ICD-10-CM | POA: Diagnosis not present

## 2022-04-01 DIAGNOSIS — R0902 Hypoxemia: Secondary | ICD-10-CM | POA: Diagnosis not present

## 2022-04-01 DIAGNOSIS — Z452 Encounter for adjustment and management of vascular access device: Secondary | ICD-10-CM | POA: Diagnosis not present

## 2022-04-01 DIAGNOSIS — J432 Centrilobular emphysema: Secondary | ICD-10-CM | POA: Diagnosis not present

## 2022-04-01 DIAGNOSIS — K56699 Other intestinal obstruction unspecified as to partial versus complete obstruction: Secondary | ICD-10-CM | POA: Diagnosis not present

## 2022-04-01 DIAGNOSIS — Z4682 Encounter for fitting and adjustment of non-vascular catheter: Secondary | ICD-10-CM | POA: Diagnosis not present

## 2022-04-01 DIAGNOSIS — R1084 Generalized abdominal pain: Secondary | ICD-10-CM | POA: Diagnosis not present

## 2022-04-01 DIAGNOSIS — K56609 Unspecified intestinal obstruction, unspecified as to partial versus complete obstruction: Secondary | ICD-10-CM | POA: Diagnosis not present

## 2022-04-01 DIAGNOSIS — R112 Nausea with vomiting, unspecified: Secondary | ICD-10-CM | POA: Diagnosis not present

## 2022-04-01 NOTE — Telephone Encounter (Signed)
LVM for patient to call back.   Skii Cleland,cma  

## 2022-04-01 NOTE — Telephone Encounter (Signed)
Can you call him and get more details on his symptoms? Is there blood in his vomit? Any diarrhea or blood in his stool? Fevers? Other symptoms? How is he urinating? What color is his urine? Is he urinating a normal volume?

## 2022-04-01 NOTE — Telephone Encounter (Signed)
LVM for patient to call back.   Armand Preast,cma  

## 2022-04-01 NOTE — Telephone Encounter (Signed)
Patient is scheduled to see the provider tomorrow but access nurse spoke with him and the note is not done yet.  Sharmin Foulk,cma

## 2022-04-01 NOTE — Telephone Encounter (Signed)
Pt called stating he came home last Thursday from the hospital and for the last two nights the pt has been throwing up green stuff and nauseated. Sent to access nurse

## 2022-04-02 ENCOUNTER — Inpatient Hospital Stay: Payer: Medicare (Managed Care) | Admitting: Family Medicine

## 2022-04-02 DIAGNOSIS — J90 Pleural effusion, not elsewhere classified: Secondary | ICD-10-CM | POA: Diagnosis not present

## 2022-04-02 DIAGNOSIS — K5669 Other partial intestinal obstruction: Secondary | ICD-10-CM | POA: Diagnosis not present

## 2022-04-02 DIAGNOSIS — K565 Intestinal adhesions [bands], unspecified as to partial versus complete obstruction: Secondary | ICD-10-CM | POA: Diagnosis not present

## 2022-04-02 DIAGNOSIS — J811 Chronic pulmonary edema: Secondary | ICD-10-CM | POA: Diagnosis not present

## 2022-04-02 DIAGNOSIS — I4891 Unspecified atrial fibrillation: Secondary | ICD-10-CM | POA: Diagnosis not present

## 2022-04-02 DIAGNOSIS — R109 Unspecified abdominal pain: Secondary | ICD-10-CM | POA: Diagnosis not present

## 2022-04-02 DIAGNOSIS — N179 Acute kidney failure, unspecified: Secondary | ICD-10-CM | POA: Diagnosis not present

## 2022-04-02 DIAGNOSIS — I10 Essential (primary) hypertension: Secondary | ICD-10-CM | POA: Diagnosis not present

## 2022-04-02 DIAGNOSIS — R918 Other nonspecific abnormal finding of lung field: Secondary | ICD-10-CM | POA: Diagnosis not present

## 2022-04-02 NOTE — Telephone Encounter (Signed)
LVM for patient to call back.   Aleighya Mcanelly,cma  

## 2022-04-03 DIAGNOSIS — Z4682 Encounter for fitting and adjustment of non-vascular catheter: Secondary | ICD-10-CM | POA: Diagnosis not present

## 2022-04-03 DIAGNOSIS — K56609 Unspecified intestinal obstruction, unspecified as to partial versus complete obstruction: Secondary | ICD-10-CM | POA: Diagnosis not present

## 2022-04-04 DIAGNOSIS — K56609 Unspecified intestinal obstruction, unspecified as to partial versus complete obstruction: Secondary | ICD-10-CM | POA: Diagnosis not present

## 2022-04-04 DIAGNOSIS — Z452 Encounter for adjustment and management of vascular access device: Secondary | ICD-10-CM | POA: Diagnosis not present

## 2022-04-04 DIAGNOSIS — Z4682 Encounter for fitting and adjustment of non-vascular catheter: Secondary | ICD-10-CM | POA: Diagnosis not present

## 2022-04-04 DIAGNOSIS — K5669 Other partial intestinal obstruction: Secondary | ICD-10-CM | POA: Diagnosis not present

## 2022-04-05 DIAGNOSIS — I517 Cardiomegaly: Secondary | ICD-10-CM | POA: Diagnosis not present

## 2022-04-05 DIAGNOSIS — Z952 Presence of prosthetic heart valve: Secondary | ICD-10-CM | POA: Diagnosis not present

## 2022-04-05 NOTE — Telephone Encounter (Signed)
Could not reach patient after several attempts by phone, he is going to have his sister put on his DPR and I can call and speak with her.  Reathel Turi,cma

## 2022-04-07 DIAGNOSIS — R112 Nausea with vomiting, unspecified: Secondary | ICD-10-CM | POA: Diagnosis not present

## 2022-04-07 DIAGNOSIS — R140 Abdominal distension (gaseous): Secondary | ICD-10-CM | POA: Diagnosis not present

## 2022-04-14 DIAGNOSIS — I5032 Chronic diastolic (congestive) heart failure: Secondary | ICD-10-CM | POA: Diagnosis not present

## 2022-04-14 DIAGNOSIS — D509 Iron deficiency anemia, unspecified: Secondary | ICD-10-CM | POA: Diagnosis not present

## 2022-04-14 DIAGNOSIS — Z9049 Acquired absence of other specified parts of digestive tract: Secondary | ICD-10-CM | POA: Diagnosis not present

## 2022-04-14 DIAGNOSIS — I5030 Unspecified diastolic (congestive) heart failure: Secondary | ICD-10-CM | POA: Diagnosis not present

## 2022-04-14 DIAGNOSIS — M4320 Fusion of spine, site unspecified: Secondary | ICD-10-CM | POA: Diagnosis not present

## 2022-04-14 DIAGNOSIS — K219 Gastro-esophageal reflux disease without esophagitis: Secondary | ICD-10-CM | POA: Diagnosis not present

## 2022-04-14 DIAGNOSIS — Z736 Limitation of activities due to disability: Secondary | ICD-10-CM | POA: Diagnosis not present

## 2022-04-14 DIAGNOSIS — R1031 Right lower quadrant pain: Secondary | ICD-10-CM | POA: Diagnosis not present

## 2022-04-14 DIAGNOSIS — N1831 Chronic kidney disease, stage 3a: Secondary | ICD-10-CM | POA: Insufficient documentation

## 2022-04-14 DIAGNOSIS — G8929 Other chronic pain: Secondary | ICD-10-CM | POA: Diagnosis not present

## 2022-04-14 DIAGNOSIS — Z952 Presence of prosthetic heart valve: Secondary | ICD-10-CM | POA: Diagnosis not present

## 2022-04-14 DIAGNOSIS — I482 Chronic atrial fibrillation, unspecified: Secondary | ICD-10-CM | POA: Diagnosis not present

## 2022-04-14 DIAGNOSIS — Z953 Presence of xenogenic heart valve: Secondary | ICD-10-CM | POA: Diagnosis not present

## 2022-04-14 DIAGNOSIS — E785 Hyperlipidemia, unspecified: Secondary | ICD-10-CM | POA: Diagnosis not present

## 2022-04-14 DIAGNOSIS — Z8719 Personal history of other diseases of the digestive system: Secondary | ICD-10-CM | POA: Insufficient documentation

## 2022-04-14 DIAGNOSIS — I110 Hypertensive heart disease with heart failure: Secondary | ICD-10-CM | POA: Diagnosis not present

## 2022-04-14 DIAGNOSIS — D649 Anemia, unspecified: Secondary | ICD-10-CM | POA: Diagnosis not present

## 2022-04-14 DIAGNOSIS — I4891 Unspecified atrial fibrillation: Secondary | ICD-10-CM | POA: Diagnosis not present

## 2022-04-14 DIAGNOSIS — M6281 Muscle weakness (generalized): Secondary | ICD-10-CM | POA: Diagnosis not present

## 2022-04-14 DIAGNOSIS — R140 Abdominal distension (gaseous): Secondary | ICD-10-CM | POA: Diagnosis not present

## 2022-04-14 DIAGNOSIS — Z79899 Other long term (current) drug therapy: Secondary | ICD-10-CM | POA: Diagnosis not present

## 2022-04-14 DIAGNOSIS — I2510 Atherosclerotic heart disease of native coronary artery without angina pectoris: Secondary | ICD-10-CM | POA: Diagnosis not present

## 2022-04-14 DIAGNOSIS — G629 Polyneuropathy, unspecified: Secondary | ICD-10-CM | POA: Diagnosis not present

## 2022-04-14 DIAGNOSIS — N400 Enlarged prostate without lower urinary tract symptoms: Secondary | ICD-10-CM | POA: Diagnosis not present

## 2022-04-14 DIAGNOSIS — Z7901 Long term (current) use of anticoagulants: Secondary | ICD-10-CM | POA: Diagnosis not present

## 2022-04-14 DIAGNOSIS — R5381 Other malaise: Secondary | ICD-10-CM | POA: Diagnosis not present

## 2022-04-14 DIAGNOSIS — K56609 Unspecified intestinal obstruction, unspecified as to partial versus complete obstruction: Secondary | ICD-10-CM | POA: Diagnosis not present

## 2022-04-22 DIAGNOSIS — M5440 Lumbago with sciatica, unspecified side: Secondary | ICD-10-CM | POA: Diagnosis not present

## 2022-04-22 DIAGNOSIS — M546 Pain in thoracic spine: Secondary | ICD-10-CM | POA: Diagnosis not present

## 2022-04-22 DIAGNOSIS — F1120 Opioid dependence, uncomplicated: Secondary | ICD-10-CM | POA: Diagnosis not present

## 2022-04-23 ENCOUNTER — Telehealth: Payer: Self-pay | Admitting: Cardiovascular Disease

## 2022-04-23 NOTE — Telephone Encounter (Signed)
Called and spoke with the patient. He stated that he has been in and out of the hospital with GI issues the past several weeks. He was recently discharged from [facility] from surgery due to a GI block. He got home on Tuesday. He stated that for the past couple of days, he has been having bilateral lower extremity swelling. He denies shortness of breath and he does limit his salt intake. He elevates his legs when he can but does say he does not ambulate very much due to the recent surgery.   His current weight is 154 pounds which is down due to the surgeries.   He has Furosemide 40 mg once daily at home and has taken in on Thursday and today with little results. He has been advised to elevate his legs and cut back his sodium.   He has an appointment with PCP on Monday. He has been advised to call the on call this weekend if the swelling worsens.

## 2022-04-23 NOTE — Telephone Encounter (Signed)
Pt c/o swelling: STAT is pt has developed SOB within 24 hours  If swelling, where is the swelling located? Feet and ankles  How much weight have you gained and in what time span? States he has lost weight   Have you gained 3 pounds in a day or 5 pounds in a week? no  Do you have a log of your daily weights (if so, list)? No   Are you currently taking a fluid pill? Yes, just started taking them Thursday morning  Are you currently SOB? No   Have you traveled recently? No    States he has been in and out of the hospital with GI issues.

## 2022-04-26 ENCOUNTER — Ambulatory Visit (INDEPENDENT_AMBULATORY_CARE_PROVIDER_SITE_OTHER): Payer: Medicare (Managed Care) | Admitting: Family Medicine

## 2022-04-26 ENCOUNTER — Inpatient Hospital Stay: Payer: Medicare (Managed Care) | Admitting: Family Medicine

## 2022-04-26 ENCOUNTER — Telehealth: Payer: Self-pay | Admitting: Cardiovascular Disease

## 2022-04-26 ENCOUNTER — Encounter: Payer: Self-pay | Admitting: Family Medicine

## 2022-04-26 VITALS — BP 114/66 | HR 89 | Temp 97.7°F | Ht 72.0 in | Wt 162.6 lb

## 2022-04-26 DIAGNOSIS — R0600 Dyspnea, unspecified: Secondary | ICD-10-CM

## 2022-04-26 DIAGNOSIS — K56609 Unspecified intestinal obstruction, unspecified as to partial versus complete obstruction: Secondary | ICD-10-CM

## 2022-04-26 DIAGNOSIS — R600 Localized edema: Secondary | ICD-10-CM

## 2022-04-26 DIAGNOSIS — D649 Anemia, unspecified: Secondary | ICD-10-CM

## 2022-04-26 NOTE — Telephone Encounter (Signed)
Patient is calling about his call on 04/23/22 (see phone note).  Patient showed up at wrong time for his appt with his PCP today, the nurse was going to evaluate him.  He would like for Dr [name] to see him.

## 2022-04-26 NOTE — Assessment & Plan Note (Signed)
Status post surgical intervention for this.  He will follow-up with general surgery.

## 2022-04-26 NOTE — Patient Instructions (Addendum)
Nice to see you. We will check labs today and contact you with the results.  Please continue the lasix 40 mg daily for now and I will advise you on whether or not this needs to be changed once I have your labs back. If you develop worsening breathing issues, chest pain, or any new symptoms please seek medical attention.

## 2022-04-26 NOTE — Assessment & Plan Note (Signed)
Patient's dyspnea could be related to multiple different things.  He could have fluid accumulation leading to a CHF exacerbation or it could be related to anemia.  His EKG is reassuring and he has no chest pain making an ischemic cause less likely.  I would expect that a CHF issue would have responded some to Lasix though this does not seem to have responded.  Discussed at this point were going to do some lab work to evaluate for underlying cause.  He will continue Lasix 40 mg daily contact him with his labs results tomorrow to help determine the next step in management.

## 2022-04-26 NOTE — Telephone Encounter (Signed)
Pt updated with MD's recommendations. Pt stated he's concerned about his swelling, however he has an appointment scheduled with PCP today. Nurse advised pt to keep scheduled appointment with pcp and to call office if pcp feels he need to be evaluated further by cardiologist. Pt verbalized understanding.   Minna Merritts, MD  [facility] Triage2 days ago    I would stay on the Lasix 40 daily for now He is anemic with hemoglobin of 9 That can contribute to leg swelling Once blood count gets closer to normal range 12-13, should improve Would wear compression hose which can help facilitate mobilization of the fluid/edema Thx TGollan

## 2022-04-26 NOTE — Assessment & Plan Note (Signed)
Patient has stasis dermatitis changes in his legs indicating that this could be related to venous stasis.  CHF is a potential.  Discussed anemia can also cause swelling.  Will check lab work to evaluate for underlying cause of his swelling.  Advised I would contact him once his labs return to help determine the next epi management.

## 2022-04-26 NOTE — Progress Notes (Signed)
[name], MD Phone: [phone]  Bryan [name] is a 81 y.o. male who presents today for same-day visit.  Bilateral lower extremity swelling: Patient notes abrupt ankle swelling starting on 04/22/2022.  He notes it has progressed up to his knees.  He notes he started Lasix 40 mg daily on the 30th and has taken this every day since then.  It has not proved of any benefit.  He has been short of breath with doing just about anything.  He is getting fatigued with minimal effort.  He notes the swelling does not go down with elevation.  He notes no orthopnea, PND, cough, or wheezing.  He notes no chest pain.  He is on Eliquis 5 mg twice daily.  He notes this was held around the time of his surgery though he has resumed it since his surgery which was on 04/04/2022.  He was anemic in the hospital after his surgery.  He wonders why the physicians in the hospital did not do anything about this before he left.  SBO: Patient recently hospitalized for small bowel obstruction that was concerning for mechanical obstruction.  He was treated with conservative management initially and then eventually underwent exploratory laparotomy with mesh removal and small bowel resection with primary anastomosis.  He was progressively anemic during his hospitalization though hemoglobin stabilized around 9 prior to discharge.  He was discharged to SNF after he progressed well.  Social History   Tobacco Use  Smoking Status Former   Packs/day: 1.00   Years: 32.00   Total pack years: 32.00   Types: Cigarettes   Quit date: 07/05/1981   Years since quitting: 40.8  Smokeless Tobacco Never    Current Outpatient Medications on File Prior to Visit  Medication Sig Dispense Refill   amLODipine (NORVASC) 5 MG tablet TAKE 1 TABLET BY MOUTH DAILY. 90 tablet 1   atorvastatin (LIPITOR) 40 MG tablet TAKE 1 TABLET BY MOUTH DAILY 90 tablet 3   calcium carbonate (OS-CAL - DOSED IN MG OF ELEMENTAL CALCIUM) 1250 (500 Ca) MG  tablet Take 1 tablet by mouth daily with lunch.     donepezil (ARICEPT) 10 MG tablet Take 1 tablet by mouth at bedtime.     feeding supplement (ENSURE ENLIVE / ENSURE PLUS) LIQD Take 237 mLs by mouth 2 (two) times daily between meals. 237 mL 12   ferrous sulfate 325 (65 FE) MG tablet Take by mouth.     FLUoxetine (PROZAC) 20 MG capsule TAKE 1 CAPSULE BY MOUTH ONCE DAILY 90 capsule 1   gabapentin (NEURONTIN) 100 MG capsule Take 1 capsule (100 mg total) by mouth 3 (three) times daily. 90 capsule 0   HYDROcodone-acetaminophen (NORCO) 10-325 MG tablet Take 1 tablet by mouth 4 (four) times daily as needed for moderate pain.     hydrocortisone cream 1 % Apply 1 application topically daily as needed for itching.     ipratropium (ATROVENT) 0.06 % nasal spray Place 2 sprays into both nostrils 4 (four) times daily. 15 mL 12   lisinopril (ZESTRIL) 20 MG tablet Take 30 mg by mouth daily.     loperamide (IMODIUM) 2 MG capsule Take 1 capsule (2 mg total) by mouth as needed for diarrhea or loose stools. 30 capsule 0   loratadine (CLARITIN) 10 MG tablet Take 10 mg by mouth daily.     melatonin 5 MG TABS Take 1 tablet (5 mg total) by mouth at bedtime as needed.  0   Multiple Vitamins-Minerals (PRESERVISION AREDS 2) CAPS  Take 1 capsule by mouth daily.     omeprazole (PRILOSEC) 40 MG capsule TAKE 1 CAPSULE EVERY DAY 90 capsule 3   vitamin B-12 (CYANOCOBALAMIN) 1000 MCG tablet Take 1,000 mcg by mouth daily with lunch.      Vitamin D, Ergocalciferol, (DRISDOL) 1.25 MG (50000 UNIT) CAPS capsule Take 1 capsule by mouth on Monday and Thursday. 16 capsule 1   vitamin E 180 MG (400 UNITS) capsule Take 800 Units by mouth daily with lunch.     Current Facility-Administered Medications on File Prior to Visit  Medication Dose Route Frequency Provider Last Rate Last Admin   0.9 %  sodium chloride infusion   Intravenous Continuous Lequita Asal, MD 10 mL/hr at 12/11/19 1123 New Bag at 12/11/19 1123     ROS see  history of present illness  Objective  Physical Exam Vitals:   04/26/22 1453  BP: 114/66  Pulse: 89  Temp: 97.7 F (36.5 C)  SpO2: 94%    BP Readings from Last 3 Encounters:  04/26/22 114/66  03/25/22 138/76  02/02/22 (!) 142/69   Wt Readings from Last 3 Encounters:  04/26/22 162 lb 9.6 oz (73.8 kg)  03/19/22 150 lb (68 kg)  02/22/22 156 lb (70.8 kg)    Physical Exam Constitutional:      General: He is not in acute distress.    Appearance: He is not diaphoretic.  Cardiovascular:     Rate and Rhythm: Normal rate. Rhythm irregularly irregular.     Heart sounds: Normal heart sounds.  Pulmonary:     Effort: Pulmonary effort is normal.     Breath sounds: Normal breath sounds.  Musculoskeletal:     Comments: 3+ pitting edema bilateral lower extremities up to the knee, stasis dermatitis changes bilateral lower legs with weeping serosanguineous fluid from the right leg, right leg appears to be slightly bigger than the left leg  Skin:    General: Skin is warm and dry.  Neurological:     Mental Status: He is alert.    EKG: Initial EKG had limb lead reversal, the appropriate EKG revealed atrial fibrillation with incomplete left bundle branch block, no ischemic changes, rate 75  Assessment/Plan: Please see individual problem list.  Problem List Items Addressed This Visit     Anemia    Suspect his anemia got worse after his surgery.  Will recheck today to ensure that it is not trended even further down.      Bilateral lower extremity edema    Patient has stasis dermatitis changes in his legs indicating that this could be related to venous stasis.  CHF is a potential.  Discussed anemia can also cause swelling.  Will check lab work to evaluate for underlying cause of his swelling.  Advised I would contact him once his labs return to help determine the next epi management.      Dyspnea - Primary    Patient's dyspnea could be related to multiple different things.  He could  have fluid accumulation leading to a CHF exacerbation or it could be related to anemia.  His EKG is reassuring and he has no chest pain making an ischemic cause less likely.  I would expect that a CHF issue would have responded some to Lasix though this does not seem to have responded.  Discussed at this point were going to do some lab work to evaluate for underlying cause.  He will continue Lasix 40 mg daily contact him with his labs results tomorrow to  help determine the next step in management.      Relevant Orders   EKG 12-Lead (Completed)   CBC   Comp Met (CMET)   TSH   B Nat Peptide   Small bowel obstruction (HCC)    Status post surgical intervention for this.  He will follow-up with general surgery.        Return in about 4 weeks (around 05/24/2022).  I have spent 42 minutes in the care of this patient regarding history taking, documentation, completion of exam, discussion of plan, interpretation of EKG, placing orders.   [name], MD [place]

## 2022-04-26 NOTE — Telephone Encounter (Signed)
Attempted to call the patient. No answer- I left a message to call back or send a MyChart message that he received the message and we can call him back.

## 2022-04-26 NOTE — Assessment & Plan Note (Signed)
Suspect his anemia got worse after his surgery.  Will recheck today to ensure that it is not trended even further down.

## 2022-04-27 LAB — COMPREHENSIVE METABOLIC PANEL
ALT: 13 U/L (ref 0–53)
AST: 24 U/L (ref 0–37)
Albumin: 3.4 g/dL — ABNORMAL LOW (ref 3.5–5.2)
Alkaline Phosphatase: 75 U/L (ref 39–117)
BUN: 30 mg/dL — ABNORMAL HIGH (ref 6–23)
CO2: 27 mEq/L (ref 19–32)
Calcium: 7.2 mg/dL — ABNORMAL LOW (ref 8.4–10.5)
Chloride: 97 mEq/L (ref 96–112)
Creatinine, Ser: 1.48 mg/dL (ref 0.40–1.50)
GFR: 43.38 mL/min — ABNORMAL LOW (ref >60.00)
Glucose, Bld: 102 mg/dL — ABNORMAL HIGH (ref 70–99)
Potassium: 3.7 mEq/L (ref 3.5–5.1)
Sodium: 137 mEq/L (ref 135–145)
Total Bilirubin: 0.4 mg/dL (ref 0.2–1.2)
Total Protein: 6.3 g/dL (ref 6.0–8.3)

## 2022-04-27 LAB — TSH: TSH: 1.02 u[IU]/mL (ref 0.35–5.50)

## 2022-04-27 LAB — CBC
HCT: 26.9 % — ABNORMAL LOW (ref 39.0–52.0)
Hemoglobin: 9 g/dL — ABNORMAL LOW (ref 13.0–17.0)
MCHC: 33.4 g/dL (ref 30.0–36.0)
MCV: 93.2 fl (ref 78.0–100.0)
Platelets: 179 10*3/uL (ref 150.0–400.0)
RBC: 2.89 Mil/uL — ABNORMAL LOW (ref 4.22–5.81)
RDW: 19.3 % — ABNORMAL HIGH (ref 11.5–15.5)
WBC: 6.2 10*3/uL (ref 4.0–10.5)

## 2022-04-27 LAB — BRAIN NATRIURETIC PEPTIDE: Brain Natriuretic Peptide: 208 pg/mL — ABNORMAL HIGH (ref <100)

## 2022-04-28 ENCOUNTER — Telehealth: Payer: Self-pay

## 2022-04-28 ENCOUNTER — Other Ambulatory Visit: Payer: Self-pay | Admitting: Family Medicine

## 2022-04-28 ENCOUNTER — Telehealth: Payer: Self-pay | Admitting: Family Medicine

## 2022-04-28 DIAGNOSIS — N179 Acute kidney failure, unspecified: Secondary | ICD-10-CM

## 2022-04-28 DIAGNOSIS — D649 Anemia, unspecified: Secondary | ICD-10-CM

## 2022-04-28 DIAGNOSIS — M7989 Other specified soft tissue disorders: Secondary | ICD-10-CM

## 2022-04-28 NOTE — Telephone Encounter (Signed)
CMA confirmed the patient was not having any abdominal pain. Please make sure you advised him to get evaluated if the swelling worsens or if he is unable to use his legs related to the swelling.

## 2022-04-28 NOTE — Telephone Encounter (Signed)
Left message to call back  

## 2022-04-28 NOTE — Telephone Encounter (Signed)
[facility] called and stated both legs are swollen up to his knee and he can hardly bend his legs, I spoke with the provider and he is ordering a [place] for the patient and I informed him someone will call him today to set that up.  Ximenna Fonseca,cma

## 2022-04-28 NOTE — Telephone Encounter (Signed)
Patient is returning call.  °

## 2022-04-28 NOTE — Telephone Encounter (Signed)
Lft pt vm to call ofc to sch stat US. thanks

## 2022-04-28 NOTE — Telephone Encounter (Signed)
Patient called about his lab results. He says he has been waiting since Tuesday for results.

## 2022-04-29 ENCOUNTER — Ambulatory Visit
Admission: RE | Admit: 2022-04-29 | Discharge: 2022-04-29 | Disposition: A | Discharge status: Home / Self Care | Payer: Medicare (Managed Care) | Source: Ambulatory Visit | Attending: Family Medicine | Admitting: Family Medicine

## 2022-04-29 ENCOUNTER — Telehealth: Payer: Self-pay

## 2022-04-29 DIAGNOSIS — M7989 Other specified soft tissue disorders: Secondary | ICD-10-CM | POA: Diagnosis not present

## 2022-04-29 NOTE — Telephone Encounter (Signed)
Noted  

## 2022-04-29 NOTE — Telephone Encounter (Signed)
Please see result note. [place] was negative for DVT.

## 2022-04-29 NOTE — Telephone Encounter (Signed)
Kim called from Ultrasound (STAT) regarding ultrasound for patient.  I was unable to reach anyone at the time of the call.  I let Maudie Mercury know that I will put a message back for Dr. [name].

## 2022-04-29 NOTE — Telephone Encounter (Signed)
Pt was informed documented on Result note.

## 2022-04-30 ENCOUNTER — Telehealth: Payer: Self-pay | Admitting: *Deleted

## 2022-04-30 ENCOUNTER — Telehealth: Payer: Self-pay | Admitting: Family Medicine

## 2022-04-30 ENCOUNTER — Ambulatory Visit: Payer: Medicare (Managed Care) | Admitting: Family Medicine

## 2022-04-30 ENCOUNTER — Encounter: Payer: Self-pay | Admitting: Family Medicine

## 2022-04-30 VITALS — BP 130/70 | HR 92 | Temp 98.0°F | Ht 72.0 in

## 2022-04-30 DIAGNOSIS — E875 Hyperkalemia: Secondary | ICD-10-CM | POA: Diagnosis not present

## 2022-04-30 DIAGNOSIS — Z885 Allergy status to narcotic agent status: Secondary | ICD-10-CM | POA: Diagnosis not present

## 2022-04-30 DIAGNOSIS — Z9181 History of falling: Secondary | ICD-10-CM | POA: Diagnosis not present

## 2022-04-30 DIAGNOSIS — I5032 Chronic diastolic (congestive) heart failure: Secondary | ICD-10-CM | POA: Diagnosis not present

## 2022-04-30 DIAGNOSIS — Z888 Allergy status to other drugs, medicaments and biological substances status: Secondary | ICD-10-CM | POA: Diagnosis not present

## 2022-04-30 DIAGNOSIS — N400 Enlarged prostate without lower urinary tract symptoms: Secondary | ICD-10-CM | POA: Diagnosis not present

## 2022-04-30 DIAGNOSIS — Z880 Allergy status to penicillin: Secondary | ICD-10-CM | POA: Diagnosis not present

## 2022-04-30 DIAGNOSIS — Z7901 Long term (current) use of anticoagulants: Secondary | ICD-10-CM | POA: Diagnosis not present

## 2022-04-30 DIAGNOSIS — J810 Acute pulmonary edema: Secondary | ICD-10-CM | POA: Diagnosis not present

## 2022-04-30 DIAGNOSIS — M7989 Other specified soft tissue disorders: Secondary | ICD-10-CM | POA: Diagnosis not present

## 2022-04-30 DIAGNOSIS — R918 Other nonspecific abnormal finding of lung field: Secondary | ICD-10-CM | POA: Diagnosis not present

## 2022-04-30 DIAGNOSIS — R600 Localized edema: Secondary | ICD-10-CM | POA: Diagnosis not present

## 2022-04-30 DIAGNOSIS — L03119 Cellulitis of unspecified part of limb: Secondary | ICD-10-CM | POA: Diagnosis not present

## 2022-04-30 DIAGNOSIS — Z79899 Other long term (current) drug therapy: Secondary | ICD-10-CM | POA: Diagnosis not present

## 2022-04-30 DIAGNOSIS — Z9889 Other specified postprocedural states: Secondary | ICD-10-CM | POA: Diagnosis not present

## 2022-04-30 DIAGNOSIS — I110 Hypertensive heart disease with heart failure: Secondary | ICD-10-CM | POA: Diagnosis not present

## 2022-04-30 DIAGNOSIS — D649 Anemia, unspecified: Secondary | ICD-10-CM

## 2022-04-30 DIAGNOSIS — Z7409 Other reduced mobility: Secondary | ICD-10-CM | POA: Diagnosis not present

## 2022-04-30 DIAGNOSIS — L03115 Cellulitis of right lower limb: Secondary | ICD-10-CM | POA: Diagnosis not present

## 2022-04-30 DIAGNOSIS — Z9660 Presence of unspecified orthopedic joint implant: Secondary | ICD-10-CM | POA: Diagnosis not present

## 2022-04-30 DIAGNOSIS — R0602 Shortness of breath: Secondary | ICD-10-CM | POA: Diagnosis not present

## 2022-04-30 DIAGNOSIS — Z66 Do not resuscitate: Secondary | ICD-10-CM | POA: Diagnosis not present

## 2022-04-30 DIAGNOSIS — L03116 Cellulitis of left lower limb: Secondary | ICD-10-CM | POA: Diagnosis not present

## 2022-04-30 LAB — CBC
HCT: 24.5 % — ABNORMAL LOW (ref 39.0–52.0)
Hemoglobin: 7.9 g/dL — CL (ref 13.0–17.0)
MCHC: 32.3 g/dL (ref 30.0–36.0)
MCV: 94.1 fl (ref 78.0–100.0)
Platelets: 147 10*3/uL — ABNORMAL LOW (ref 150.0–400.0)
RBC: 2.61 Mil/uL — ABNORMAL LOW (ref 4.22–5.81)
RDW: 19.6 % — ABNORMAL HIGH (ref 11.5–15.5)
WBC: 4.4 10*3/uL (ref 4.0–10.5)

## 2022-04-30 LAB — COMPREHENSIVE METABOLIC PANEL
ALT: 10 U/L (ref 0–53)
AST: 19 U/L (ref 0–37)
Albumin: 3.2 g/dL — ABNORMAL LOW (ref 3.5–5.2)
Alkaline Phosphatase: 75 U/L (ref 39–117)
BUN: 36 mg/dL — ABNORMAL HIGH (ref 6–23)
CO2: 28 mEq/L (ref 19–32)
Calcium: 7.3 mg/dL — ABNORMAL LOW (ref 8.4–10.5)
Chloride: 103 mEq/L (ref 96–112)
Creatinine, Ser: 1.25 mg/dL (ref 0.40–1.50)
GFR: 53.12 mL/min — ABNORMAL LOW (ref >60.00)
Glucose, Bld: 82 mg/dL (ref 70–99)
Potassium: 4.8 mEq/L (ref 3.5–5.1)
Sodium: 138 mEq/L (ref 135–145)
Total Bilirubin: 0.4 mg/dL (ref 0.2–1.2)
Total Protein: 6 g/dL (ref 6.0–8.3)

## 2022-04-30 MED ORDER — SULFAMETHOXAZOLE-TRIMETHOPRIM 800-160 MG PO TABS: 1.0000 | ORAL_TABLET | Freq: Two times a day (BID) | ORAL | 0 refills | Status: DC

## 2022-04-30 NOTE — Telephone Encounter (Signed)
CRITICAL VALUE STICKER  CRITICAL VALUE: Hemoglobin-7.9  RECEIVER (on-site recipient of call): Phineas Real, CMA  DATE & TIME NOTIFIED: 04/30/22 @ 3:10pm  MESSENGER (representative from lab): Santiago Glad  MD NOTIFIED: Dr. Caryl Bis  TIME OF NOTIFICATION: 3:10pm  RESPONSE:

## 2022-04-30 NOTE — Telephone Encounter (Signed)
Noted. This is likely the cause of his shortness of breath. I would suggest he go to the ED for further evaluation of his anemia. They may consider admission and/or a transfusion given that his hemoglobin has dropped to less than 8. This is a change from his prior hemoglobin earlier this week. This decrease in hemoglobin may also be why his swelling has worsened.

## 2022-04-30 NOTE — Telephone Encounter (Signed)
Pt notified/ See other note in addition regarding 7.9 hgb.

## 2022-04-30 NOTE — Telephone Encounter (Signed)
Please let the patient know that I heard back from Dr [name]. He felt that the patients swelling and breathing issue was not likely to be related to heart failure with his BNP not being excessively elevated. He noted the patients symptoms would likely improve as his hemoglobin trends up which will take some time. He recommended getting ACE bandage wraps to wrap his legs to help with the swelling and to make sure the patient is getting good nutrition so that his protein levels do not drop as that could make his swelling worse. He needs to make sure he is getting adequate protein and may want to add a boost or ensure each day. We will reach out to the patient once his labs return.

## 2022-04-30 NOTE — Patient Instructions (Signed)
Nice to see you. I am going to communicate with Dr. Donivan Scull office and we will let you know what they say regarding your symptoms. We are going to start you on Bactrim to cover for possible skin infection that could be contributing to your symptoms. If you develop spreading redness, significantly increasing swelling, worsening shortness of breath, chest pain, or any new symptoms please seek medical attention immediately.

## 2022-04-30 NOTE — Telephone Encounter (Signed)
The patients caretaker called and wanted to confirm if the patient needed to go to the Ed and I informed her that he did need to go because he had a critical lab that he needed to be addressed in the Er and she understood and stated she is taking him to [facility] in [place].  Takiya Belmares,cma

## 2022-04-30 NOTE — Progress Notes (Signed)
Bryan Rumps, MD Phone: [phone]  Bryan [name] is a 81 y.o. male who presents today for follow-up.  Leg swelling: Patient continues to have issues with this.  It has worsened since I saw him on Monday.  The swelling is now up to his knees.  There continues to be some fluid weeping from the right leg particularly.  He notes the discomfort in his legs has worsened as well.  He has been taking his chronic pain medicine and still has discomfort in his legs.  He had an ultrasound yesterday that did not reveal a DVT.  He was previously taking Lasix 40 mg daily with no benefit.  His BNP was 200.  He is anemic with a hemoglobin of 9.  Albumin was only 3.4.  His kidney function was worse compared to a month ago.  He did have an echo in October that revealed a normal EF.  He does remain dyspneic if he does much of anything.  He notes it is difficult to bend his knees.  He notes no significant abdominal discomfort.  Social History   Tobacco Use  Smoking Status Former   Packs/day: 1.00   Years: 32.00   Total pack years: 32.00   Types: Cigarettes   Quit date: 07/05/1981   Years since quitting: 40.8  Smokeless Tobacco Never    Current Outpatient Medications on File Prior to Visit  Medication Sig Dispense Refill   amLODipine (NORVASC) 5 MG tablet TAKE 1 TABLET BY MOUTH DAILY. 90 tablet 1   atorvastatin (LIPITOR) 40 MG tablet TAKE 1 TABLET BY MOUTH DAILY 90 tablet 3   calcium carbonate (OS-CAL - DOSED IN MG OF ELEMENTAL CALCIUM) 1250 (500 Ca) MG tablet Take 1 tablet by mouth daily with lunch.     donepezil (ARICEPT) 10 MG tablet Take 1 tablet by mouth at bedtime.     feeding supplement (ENSURE ENLIVE / ENSURE PLUS) LIQD Take 237 mLs by mouth 2 (two) times daily between meals. 237 mL 12   ferrous sulfate 325 (65 FE) MG tablet Take by mouth.     FLUoxetine (PROZAC) 20 MG capsule TAKE 1 CAPSULE BY MOUTH ONCE DAILY 90 capsule 1   gabapentin (NEURONTIN) 100 MG capsule Take 1 capsule (100 mg  total) by mouth 3 (three) times daily. 90 capsule 0   HYDROcodone-acetaminophen (NORCO) 10-325 MG tablet Take 1 tablet by mouth 4 (four) times daily as needed for moderate pain.     hydrocortisone cream 1 % Apply 1 application topically daily as needed for itching.     ipratropium (ATROVENT) 0.06 % nasal spray Place 2 sprays into both nostrils 4 (four) times daily. 15 mL 12   lisinopril (ZESTRIL) 20 MG tablet Take 30 mg by mouth daily.     loperamide (IMODIUM) 2 MG capsule Take 1 capsule (2 mg total) by mouth as needed for diarrhea or loose stools. 30 capsule 0   loratadine (CLARITIN) 10 MG tablet Take 10 mg by mouth daily.     melatonin 5 MG TABS Take 1 tablet (5 mg total) by mouth at bedtime as needed.  0   Multiple Vitamins-Minerals (PRESERVISION AREDS 2) CAPS Take 1 capsule by mouth daily.     omeprazole (PRILOSEC) 40 MG capsule TAKE 1 CAPSULE EVERY DAY 90 capsule 3   vitamin B-12 (CYANOCOBALAMIN) 1000 MCG tablet Take 1,000 mcg by mouth daily with lunch.      Vitamin D, Ergocalciferol, (DRISDOL) 1.25 MG (50000 UNIT) CAPS capsule Take 1 capsule by mouth  on Monday and Thursday. 16 capsule 1   vitamin E 180 MG (400 UNITS) capsule Take 800 Units by mouth daily with lunch.     Current Facility-Administered Medications on File Prior to Visit  Medication Dose Route Frequency Provider Last Rate Last Admin   0.9 %  sodium chloride infusion   Intravenous Continuous Lequita Asal, MD 10 mL/hr at 12/11/19 1123 New Bag at 12/11/19 1123     ROS see history of present illness  Objective  Physical Exam Vitals:   04/30/22 1116  BP: 130/70  Pulse: 92  Temp: 98 F (36.7 C)  SpO2: 96%    BP Readings from Last 3 Encounters:  04/30/22 130/70  04/26/22 114/66  03/25/22 138/76   Wt Readings from Last 3 Encounters:  04/26/22 162 lb 9.6 oz (73.8 kg)  03/19/22 150 lb (68 kg)  02/22/22 156 lb (70.8 kg)    Physical Exam Constitutional:      General: He is not in acute distress.     Appearance: He is not diaphoretic.  Cardiovascular:     Rate and Rhythm: Normal rate and regular rhythm.     Heart sounds: Normal heart sounds.  Pulmonary:     Effort: Pulmonary effort is normal.     Breath sounds: Normal breath sounds.  Abdominal:     General: There is no distension.     Palpations: Abdomen is soft.     Tenderness: There is no abdominal tenderness.  Skin:    General: Skin is warm and dry.  Neurological:     Mental Status: He is alert.     Patient's legs on 04/26/2022    Patient's legs on 04/28/2022    Patient's legs today in the office, there is some tenderness and induration with 3+ pitting edema bilaterally  Assessment/Plan: Please see individual problem list.  Problem List Items Addressed This Visit     Anemia - Primary    Recheck today. This could be contributing to his swelling and shortness of breath.       Relevant Orders   CBC   Bilateral lower extremity edema    Patient continues to have swelling.  The swelling has worsened some.  The skin changes look like they could be from stasis dermatitis though given how quickly this came on that would seem less likely at this point.  The swelling also does not improve with elevation of his legs making venous insufficiency less likely.  DVT has been ruled out with ultrasound.  He is also on Eliquis which would make a clot unlikely.  This would seem unlikely to be related to CHF given that his BNP is not significantly elevated and he had no improvement in his symptoms with Lasix 40 mg daily for several days.  He is anemic and that could certainly be playing a role though I am not sure why the swelling would progressively worsen with his anemia being relatively stable since discharge from the hospital recently.  His amlodipine could be contributing to some of his swelling though that would not typically cause skin changes.  Certainly at this point there could be an underlying cellulitis that could be contributing to  his symptoms.  His white blood cell count was normal though given his age he may not have the expected response from his white blood cell count with an infection.  I am going to place him on Bactrim 1 tablet twice daily for 7 days to cover for possible cellulitis.  Creatinine clearance  confirmed.  He is going to stop his amlodipine and keep an eye on his blood pressure.  I am going to communicate with his cardiologist to get their input on his symptoms.  Discussed that if he has any spreading redness, significantly worsening swelling or inability to ambulate related to his swelling or if you develop shortness of breath that is worse or chest pain he should seek medical attention immediately.      Other Visit Diagnoses     Leg swelling       Relevant Orders   Comp Met (CMET)   Cellulitis of lower extremity, unspecified laterality       Relevant Medications   sulfamethoxazole-trimethoprim (BACTRIM DS) 800-160 MG tablet       Return in 3 days (on 05/03/2022) for Okay to add at 1045.  I have spent 45 minutes in the care of this patient regarding history taking, documentation, chart review, discussion of plan, completion of exam, communicating with cardiology.   Bryan Rumps, MD [place]

## 2022-04-30 NOTE — Assessment & Plan Note (Signed)
Patient continues to have swelling.  The swelling has worsened some.  The skin changes look like they could be from stasis dermatitis though given how quickly this came on that would seem less likely at this point.  The swelling also does not improve with elevation of his legs making venous insufficiency less likely.  DVT has been ruled out with ultrasound.  He is also on Eliquis which would make a clot unlikely.  This would seem unlikely to be related to CHF given that his BNP is not significantly elevated and he had no improvement in his symptoms with Lasix 40 mg daily for several days.  He is anemic and that could certainly be playing a role though I am not sure why the swelling would progressively worsen with his anemia being relatively stable since discharge from the hospital recently.  His amlodipine could be contributing to some of his swelling though that would not typically cause skin changes.  Certainly at this point there could be an underlying cellulitis that could be contributing to his symptoms.  His white blood cell count was normal though given his age he may not have the expected response from his white blood cell count with an infection.  I am going to place him on Bactrim 1 tablet twice daily for 7 days to cover for possible cellulitis.  Creatinine clearance confirmed.  He is going to stop his amlodipine and keep an eye on his blood pressure.  I am going to communicate with his cardiologist to get their input on his symptoms.  Discussed that if he has any spreading redness, significantly worsening swelling or inability to ambulate related to his swelling or if you develop shortness of breath that is worse or chest pain he should seek medical attention immediately.

## 2022-04-30 NOTE — Telephone Encounter (Signed)
Spoke with patient & dicussed hemoglobin result. Pt had been advised to go to the ED for evaluation & treatment.  Patient states that he isn't very mobile and will have to contact someone to take him. He states he should be able to get there in the next hour.  Gae Bon, please call patient before leaving today to make sure he went to the ED. Pt stated that he was going to go to [facility] if possible.

## 2022-04-30 NOTE — Assessment & Plan Note (Addendum)
Recheck today. This could be contributing to his swelling and shortness of breath.

## 2022-04-30 NOTE — Telephone Encounter (Signed)
Patient seen by PCP 12/8. Hemoglobin decreased to 7.9 and advised to go to the ED. See Epic note.

## 2022-04-30 NOTE — Telephone Encounter (Signed)
Patient was seen today and informed of his labs. Hodaya Curto,cma

## 2022-05-01 DIAGNOSIS — J439 Emphysema, unspecified: Secondary | ICD-10-CM | POA: Diagnosis not present

## 2022-05-01 DIAGNOSIS — I5031 Acute diastolic (congestive) heart failure: Secondary | ICD-10-CM | POA: Diagnosis not present

## 2022-05-01 DIAGNOSIS — D509 Iron deficiency anemia, unspecified: Secondary | ICD-10-CM | POA: Diagnosis not present

## 2022-05-01 DIAGNOSIS — I4891 Unspecified atrial fibrillation: Secondary | ICD-10-CM | POA: Diagnosis not present

## 2022-05-01 DIAGNOSIS — E875 Hyperkalemia: Secondary | ICD-10-CM | POA: Diagnosis not present

## 2022-05-01 DIAGNOSIS — I10 Essential (primary) hypertension: Secondary | ICD-10-CM | POA: Diagnosis not present

## 2022-05-03 ENCOUNTER — Ambulatory Visit: Payer: Medicare (Managed Care) | Admitting: Family Medicine

## 2022-05-03 DIAGNOSIS — I081 Rheumatic disorders of both mitral and tricuspid valves: Secondary | ICD-10-CM | POA: Diagnosis not present

## 2022-05-04 ENCOUNTER — Telehealth: Payer: Self-pay | Admitting: Family Medicine

## 2022-05-04 ENCOUNTER — Telehealth: Payer: Self-pay | Admitting: Cardiovascular Disease

## 2022-05-04 DIAGNOSIS — I10 Essential (primary) hypertension: Secondary | ICD-10-CM | POA: Diagnosis not present

## 2022-05-04 DIAGNOSIS — D61818 Other pancytopenia: Secondary | ICD-10-CM | POA: Diagnosis not present

## 2022-05-04 DIAGNOSIS — I5031 Acute diastolic (congestive) heart failure: Secondary | ICD-10-CM | POA: Diagnosis not present

## 2022-05-04 DIAGNOSIS — L03119 Cellulitis of unspecified part of limb: Secondary | ICD-10-CM | POA: Diagnosis not present

## 2022-05-04 DIAGNOSIS — I4891 Unspecified atrial fibrillation: Secondary | ICD-10-CM | POA: Diagnosis not present

## 2022-05-04 NOTE — Telephone Encounter (Signed)
Patient is scheduled to be seen the end of this week and placed on wait list if something should open sooner.

## 2022-05-04 NOTE — Telephone Encounter (Signed)
Hospital follow up scheduled for 05/07/22 @ 11:30. /Pt discharged 05/04/22

## 2022-05-04 NOTE — Telephone Encounter (Signed)
[facility] hospital called stating pt needs to f/u 1-2 post hospital visit. Informed her of my availability this week and we scheduled him for 05/07/22 however pt has another appt that day and insist that I ask if he can put in somewhere next week. Pt has been placed on the waitlist as well.

## 2022-05-06 ENCOUNTER — Telehealth: Payer: Self-pay

## 2022-05-06 DIAGNOSIS — F0390 Unspecified dementia without behavioral disturbance: Secondary | ICD-10-CM

## 2022-05-06 DIAGNOSIS — I5032 Chronic diastolic (congestive) heart failure: Secondary | ICD-10-CM | POA: Diagnosis not present

## 2022-05-06 NOTE — Telephone Encounter (Addendum)
Error. ng 

## 2022-05-06 NOTE — Telephone Encounter (Signed)
I called patient to reschedule his hospital follow-up since his provider is unavailable.  Patient states he does not have any available time next week.  Patient was very irritated that we were asking him to reschedule.  Patient asked if there is anyone else who can see him tomorrow.  I asked patient to hold while I checked the schedule and he hung up while on hold.

## 2022-05-07 ENCOUNTER — Inpatient Hospital Stay: Payer: Medicare (Managed Care) | Admitting: Family Medicine

## 2022-05-07 ENCOUNTER — Ambulatory Visit: Payer: Medicare (Managed Care) | Attending: Nurse Practitioner | Admitting: Nurse Practitioner

## 2022-05-07 ENCOUNTER — Encounter: Payer: Self-pay | Admitting: Nurse Practitioner

## 2022-05-07 VITALS — BP 132/68 | HR 70 | Ht 72.0 in | Wt 155.0 lb

## 2022-05-07 DIAGNOSIS — I4821 Permanent atrial fibrillation: Secondary | ICD-10-CM | POA: Diagnosis not present

## 2022-05-07 DIAGNOSIS — Z952 Presence of prosthetic heart valve: Secondary | ICD-10-CM | POA: Diagnosis not present

## 2022-05-07 DIAGNOSIS — I10 Essential (primary) hypertension: Secondary | ICD-10-CM

## 2022-05-07 DIAGNOSIS — L03119 Cellulitis of unspecified part of limb: Secondary | ICD-10-CM

## 2022-05-07 DIAGNOSIS — E782 Mixed hyperlipidemia: Secondary | ICD-10-CM | POA: Diagnosis not present

## 2022-05-07 DIAGNOSIS — I5033 Acute on chronic diastolic (congestive) heart failure: Secondary | ICD-10-CM

## 2022-05-07 NOTE — Telephone Encounter (Signed)
Patient called office, he was up set because he was on hold for a long time. Patient stated he wanted to cancel his 05/12/22 hospital follow up, he stated. " I am sick of this shit". He also stated he was going to look for another provider. [facility] did not cancel his hospital follow up on,05/12/22 appointment ,until management speaks to him.

## 2022-05-07 NOTE — Patient Instructions (Addendum)
Medication Instructions:  Your physician has recommended you make the following change in your medication:   INCREASE Furosemide (Lasix) 40 mg 2 tablets twice a day  *If you need a refill on your cardiac medications before your next appointment, please call your pharmacy*   Lab Work: None  If you have labs (blood work) drawn today and your tests are completely normal, you will receive your results only by: [facility] (if you have MyChart) OR A paper copy in the mail If you have any lab test that is abnormal or we need to change your treatment, we will call you to review the results.   Testing/Procedures: None   Follow-Up: At [facility], you and your health needs are our priority.  As part of our continuing mission to provide you with exceptional heart care, we have created designated Provider Care Teams.  These Care Teams include your primary Cardiologist (physician) and Advanced Practice Providers (APPs -  Physician Assistants and Nurse Practitioners) who all work together to provide you with the care you need, when you need it.   Your next appointment:   Follow up appointment scheduled for Monday with Dr. [name] at 3:00 pm  The format for your next appointment:   In Person  Provider:   Ida Rogue, MD       Important Information About [facility]

## 2022-05-07 NOTE — Telephone Encounter (Signed)
Tried to call patient X 2, patient sister called back an apologized for patient Sis ter is very concerned about mental issues and even directed to both sisters at times they are asking for help , I advised we could make a referral for a social worker to evaluate and see if they could give them some direction. I have pended a referral 2300 if PCP feels appropriate.

## 2022-05-07 NOTE — Progress Notes (Signed)
Office Visit    Patient Name: Bryan [name] Date of Encounter: 05/07/2022  Primary Care Provider:  [name], MD Primary Cardiologist:  Ida Rogue, MD  Chief Complaint    81 year old male with a history of ascending aortic aneurysm and bicuspid aortic valve status post bioprosthetic AVR and ascending aortic grafting in 2010, hypertension, hyperlipidemia, permanent atrial fibrillation, stage III chronic kidney disease, centrilobular emphysema, iron deficiency anemia, and recent right hemicolectomy with ileal resection for hemorrhagic necrosis in November 2023, who presents for office follow-up secondary to recent admission for HFpEF.  Past Medical History    Past Medical History:  Diagnosis Date   Abnormal CT scan    Asymmetric left rectal wall thickening    Anemia    Anxiety    Arrhythmia    Basal cell carcinoma 04/28/2020   L forehead - ED&C 06/17/2020   Basal cell carcinoma 05/06/2021   left scalp postauricular ant, EDC   Basal cell carcinoma 05/06/2021   left scalp postauricular post, [facility]   Chronic heart failure with preserved ejection fraction (HFpEF) ([place])    a. 02/2022 Echo: EF 60-65%; b. 04/2022 Echo: EF >55%, nl prosth valve fx, sev dil LA, nl RV fxn, mod dil RA.   Chronic lower back pain    Complication of anesthesia    Memory loss 09/2015   Coronary artery disease    a. 2010 Cath: mild-mod CAD per notes.   Depression    GERD (gastroesophageal reflux disease)    Glaucoma    H/O thoracic aortic aneurysm repair    Headache    Heart murmur    History of being hospitalized    memory lose kidney funtion down blood pressure up   History of blood transfusion    "think he had one when he had heart valve OR" (10/14/2016); "none since" (10/19/2017)   History of chicken pox    Hyperlipidemia    Hypertension    Lumbar stenosis    Murmur    Neuropathy    Osteoarthritis    worse in feet and ankles   Paroxysmal A-fib (HCC)    a. CHA2DS2VASc = 5.    Poor short term memory    takes Aricept   Rheumatoid arthritis ([place])    "all over" (10/14/2016)   Schizophrenia ([place])    Seasonal allergies    Thoracic spine pain    Valvular heart disease    Wears glasses    reading   Past Surgical History:  Procedure Laterality Date   AORTIC VALVE REPLACEMENT  2007   [facility].  [place], [place]; "pig valve"   APPENDECTOMY  2010   BACK SURGERY     CARDIAC VALVE REPLACEMENT     CATARACT EXTRACTION W/ INTRAOCULAR LENS  IMPLANT, BILATERAL Bilateral 2012   COLONOSCOPY WITH PROPOFOL N/A 09/24/2016   Procedure: COLONOSCOPY WITH PROPOFOL;  Surgeon: Jonathon Bellows, MD;  Location: [facility] ENDOSCOPY;  Service: Endoscopy;  Laterality: N/A;   ESOPHAGOGASTRODUODENOSCOPY (EGD) WITH PROPOFOL N/A 09/24/2016   Procedure: ESOPHAGOGASTRODUODENOSCOPY (EGD) WITH PROPOFOL;  Surgeon: Jonathon Bellows, MD;  Location: [facility] ENDOSCOPY;  Service: Endoscopy;  Laterality: N/A;   GIVENS CAPSULE STUDY N/A 09/24/2016   Procedure: GIVENS CAPSULE STUDY;  Surgeon: Jonathon Bellows, MD;  Location: [facility] ENDOSCOPY;  Service: Endoscopy;  Laterality: N/A;   HAMMER TOE SURGERY Right 10/09/2015   Procedure: HAMMER TOE REPAIR WITH K-WIRE FIXATION RIGHT SECOND TOE;  Surgeon: Albertine Patricia, DPM;  Location: [place];  Service: Podiatry;  Laterality: Right;  WITH LOCAL   HARDWARE REMOVAL N/A 11/02/2019   Procedure: Removal of hardware thoracic spine;  Surgeon: Kary Kos, MD;  Location: [place];  Service: Neurosurgery;  Laterality: N/A;  posterior   INGUINAL HERNIA REPAIR Right 05/05/2018   Medium Bard PerFix plug.  Surgeon: Robert Bellow, MD;  Location: [facility]S;  Service: General;  Laterality: Right;   IR VERTEBROPLASTY CERV/THOR BX INC UNI/BIL INC/INJECT/IMAGING  10/27/2018   JOINT REPLACEMENT Left    left total knee   LAMINECTOMY WITH POSTERIOR LATERAL ARTHRODESIS LEVEL 3 N/A 09/28/2017   Procedure: LUMBAR  POSTEROR  FUSION REVISION - LUMBAR ONE -TWO, LUMBAR TWO -THREE,THREE-FOUR  ,BILATERAL ARTHRODESIS REMOVAL LUMBAR THREE HARDWARE;  Surgeon: Kary Kos, MD;  Location: [place];  Service: Neurosurgery;  Laterality: N/A;   LAMINECTOMY WITH POSTERIOR LATERAL ARTHRODESIS LEVEL 3 N/A 10/20/2017   Procedure: Revision fusion and removal of hardware Lumbar one, Pedicle screw fixation thoracic ten-lumbar two, thoracic nine-ten laminotomy, Posterior Lumbar Arthrodesis thoracic ten-lumbar two ;  Surgeon: Kary Kos, MD;  Location: [place];  Service: Neurosurgery;  Laterality: N/A;   LAPAROSCOPIC CHOLECYSTECTOMY  2010   LUMBAR FUSION Right 06/08/2017   LUMBAR THREE-FOUR, LUMBAR FOUR-FIVE POSTEROLATERAL ARTHRODESIS WITH RIGHT LUMBAR FOUR-FIVE LAMINECTOMY/FORAMINOTOMY   LUMBAR LAMINECTOMY/DECOMPRESSION MICRODISCECTOMY Right 03/08/2016   Procedure: Laminectomy and Foraminotomy - Lumbar Five-Sacral One Right;  Surgeon: Kary Kos, MD;  Location: [place];  Service: Neurosurgery;  Laterality: Right;  Right   MULTIPLE TOOTH EXTRACTIONS     "3"   OTOPLASATY Right 12/15/2021   Procedure: Right auricle repair;  Surgeon: Margaretha Sheffield, MD;  Location: [facility] ORS;  Service: ENT;  Laterality: Right;   POSTERIOR LUMBAR FUSION  10/14/2016   L5-S1   REPLACEMENT TOTAL KNEE Left 2003   THORACIC AORTIC ANEURYSM REPAIR  2010   TOTAL KNEE ARTHROPLASTY Right 08/2021    Allergies  Allergies  Allergen Reactions   Penicillins Hives, Swelling and Other (See Comments)    SWELLING REACTION UNSPECIFIED PATIENT HAS TAKEN AMOXICILLIN ON MED HX FROM [facility] PATIENT HAS HAD A PCN REACTION WITH IMMEDIATE RASH, FACIAL/TONGUE/THROAT SWELLING, SOB, OR LIGHTHEADEDNESS WITH HYPOTENSION:  #  #  #  YES  #  #  #   Has patient had a PCN reaction causing severe rash involving mucus membranes or skin necrosis: No Has patient had a PCN reaction that required hospitalization No Has patient had a PCN reaction occurring within the last 10 years: No   Demerol [Meperidine] Hives and Nausea And Vomiting   Other     History of Present  Illness    81 year old male with above past medical history including ascending aortic aneurysm and bicuspid aortic valve status post bioprosthetic AVR and ascending aortic grafting in 2010, hypertension, hyperlipidemia, permanent atrial fibrillation, stage III chronic kidney disease, centrilobular emphysema, iron deficiency anemia, and recent right hemicolectomy with 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  He recently presented with abdominal pain, nausea, and vomiting in early November, was found to have CT findings concerning for mechanical obstruction.  He developed atrial fibrillation with rapid ventricular response while in the emergency department and was transferred to [facility].  He underwent exploratory laparotomy on November 12 with mesh removal and small bowel resection with primary anastomosis.  He did well postoperatively and was subsequently discharged to skilled nursing facility.  Unfortunately, on December 8, who presented back to the [facility] emergency department with progressive dyspnea and edema.  BNP was elevated at 412.  Chest x-ray showed pulmonary edema.  Weight  was up 4.6 kg on presentation.  He responded well to intravenous Lasix.  Transthoracic echocardiogram on December 11, showed normal LV function with normal functioning bioprosthetic aortic valve, severely dilated left atrium, moderately dilated right atrium, and normal RV function.  He was also treated for bilateral lower extremity cellulitis and discharged home on Lasix 40 mg daily, doxycycline, and Keflex.  Prior home doses of amlodipine and lisinopril were discontinued.  Since discharge, has felt reasonably well however, he has noted some return of his bilateral lower extremity edema without erythema.  In discussing his diet, he typically eats microwave type meals, which he notes are processed and relatively salty.  He says he has not done very much since he has been out of the hospital for the past 3 days  and therefore cannot really gauge his degree of dyspnea on exertion.  He denies chest pain, palpitations, PND, orthopnea, dizziness, syncope, or early satiety.  Home Medications    Prior to Admission medications   Medication Sig Start Date End Date Taking? Authorizing Provider  amLODipine (NORVASC) 5 MG tablet TAKE 1 TABLET BY MOUTH DAILY. 12/21/21   Minna Merritts, MD  atorvastatin (LIPITOR) 40 MG tablet TAKE 1 TABLET BY MOUTH DAILY 09/22/21   [name], MD  calcium carbonate (OS-CAL - DOSED IN MG OF ELEMENTAL CALCIUM) 1250 (500 Ca) MG tablet Take 1 tablet by mouth daily with lunch.    [provider]  donepezil (ARICEPT) 10 MG tablet Take 1 tablet by mouth at bedtime. 09/25/21   [provider]  feeding supplement (ENSURE ENLIVE / ENSURE PLUS) LIQD Take 237 mLs by mouth 2 (two) times daily between meals. 03/25/22   Nicole Kindred A, DO  ferrous sulfate 325 (65 FE) MG tablet Take by mouth.    [provider]  FLUoxetine (PROZAC) 20 MG capsule TAKE 1 CAPSULE BY MOUTH ONCE DAILY 03/01/22   [name], MD  gabapentin (NEURONTIN) 100 MG capsule Take 1 capsule (100 mg total) by mouth 3 (three) times daily. 03/25/22   Ezekiel Slocumb, DO  HYDROcodone-acetaminophen (NORCO) 10-325 MG tablet Take 1 tablet by mouth 4 (four) times daily as needed for moderate pain.    [provider]  hydrocortisone cream 1 % Apply 1 application topically daily as needed for itching.    [provider]  ipratropium (ATROVENT) 0.06 % nasal spray Place 2 sprays into both nostrils 4 (four) times daily. 12/30/21   [name], MD  lisinopril (ZESTRIL) 20 MG tablet Take 30 mg by mouth daily. 03/08/16   [provider]  loperamide (IMODIUM) 2 MG capsule Take 1 capsule (2 mg total) by mouth as needed for diarrhea or loose stools. 03/25/22   Ezekiel Slocumb, DO  loratadine (CLARITIN) 10 MG tablet Take 10 mg by mouth daily.    [provider]   melatonin 5 MG TABS Take 1 tablet (5 mg total) by mouth at bedtime as needed. 03/25/22   Ezekiel Slocumb, DO  Multiple Vitamins-Minerals (PRESERVISION AREDS 2) CAPS Take 1 capsule by mouth daily.    [provider]  omeprazole (PRILOSEC) 40 MG capsule TAKE 1 CAPSULE EVERY DAY 06/25/21   Jonathon Bellows, MD  sulfamethoxazole-trimethoprim (BACTRIM DS) 800-160 MG tablet Take 1 tablet by mouth 2 (two) times daily. 04/30/22   [name], MD  vitamin B-12 (CYANOCOBALAMIN) 1000 MCG tablet Take 1,000 mcg by mouth daily with lunch.     [provider]  Vitamin D, Ergocalciferol, (  DRISDOL) 1.25 MG (50000 UNIT) CAPS capsule Take 1 capsule by mouth on Monday and Thursday. 02/10/22   [name], MD  vitamin E 180 MG (400 UNITS) capsule Take 800 Units by mouth daily with lunch.    [provider]            Review of Systems    Increasing lower extremity swelling without erythema.  Some degree of chronic dyspnea on exertion.  He denies chest pain, palpitations, PND, orthopnea, dizziness, syncope, or early satiety.  All other systems reviewed and are otherwise negative except as noted above.   Physical Exam    VS:  BP (!) 140/68 (BP Location: Left Arm, Patient Position: Sitting, Cuff Size: Normal)   Ht 6' (1.829 m)   Wt 155 lb (70.3 kg)   SpO2 98%   BMI 21.02 kg/m  , BMI Body mass index is 21.02 kg/m.     Vitals:   05/07/22 1416 05/07/22 1632  BP: (!) 140/68 132/68  Pulse: 70   SpO2: 98%     GEN: Well nourished, well developed, in no acute distress. HEENT: normal. Neck: Supple, no JVD, carotid bruits, or masses. Cardiac: Irregularly irregular, 2/6 systolic murmur heard throughout.  No rubs or gallops.  No clubbing or cyanosis.  2+ bilateral lower extremity edema to the knees.  No erythema or skin breakdown.  Radials 2+/PT 1+ and equal bilaterally.  Respiratory:  Respirations regular and unlabored, clear to auscultation bilaterally. GI: Soft, nontender,  nondistended, BS + x 4. MS: no deformity or atrophy. Skin: warm and dry, no rash. Neuro:  Strength and sensation are intact. Psych: Normal affect.  Accessory Clinical Findings    ECG personally reviewed by me today -A-fib, 70, left axis deviation, PVC- no acute changes.  Labs dated May 04, 2022 from [facility]:  Hemoglobin 8.3, hematocrit 25.2, WBC 3.9, platelets 172 Sodium 138, potassium 3.8, chloride 105, CO2 25.5, BUN 22, creatinine 0.81, glucose 138 Calcium 8.6, magnesium 1.1  Labs dated May 01, 2022 from [facility]:  Total cholesterol 74, triglycerides 65, HDL 40, LDL 21 Serum iron 26, TIBC 185.2, transferrin 147, iron saturation 14 Hemoglobin A1c less than 3.8  Assessment & Plan    1.  Acute on chronic heart failure with preserved ejection fraction: Patient recently admitted to [facility] earlier in the week in the setting of progressive dyspnea, lower extremity edema, and lower extremity erythema and pain.  He was treated for HFpEF and lower extremity cellulitis and has since completed a course of doxycycline and Keflex.  Echo during admission showed an EF greater than 55% with normal bioprosthetic aortic valve, and normal RV function.  Since discharge, he has had some return of lower extremity swelling is 2+ lower extremity edema to his knees today.  He has some degree of chronic, stable dyspnea on exertion but also notes that he has not done very much at home.  He has been weighing himself and thinks his weight has been stable at 154 pounds on his home scale.  In discussing his diet, he does seem to have quite a bit of salt intake in processed soups and microwave type meals.  We discussed this at length today and I strongly encouraged him to change his diet to more whole foods, which he can prepare from scratch.  I also encouraged him to keep legs elevated when sitting and wear compression hose, which she says he owns.  I am going to increase his Lasix to 80 mg twice daily  for the  weekend and have arranged for early follow-up on Monday.  We will need to follow-up with basic metabolic panel and magnesium at that time and orders have been placed.  2.  Essential hypertension: Blood pressure initially elevated today.  Adjusting diuretics as above.  Previously on amlodipine and lisinopril, which were both discontinued during his hospitalization.  Would avoid amlodipine going forward in the setting of lower extremity edema, but may need to reconsider lisinopril.  3.  Permanent atrial fibrillation: Well rate controlled in the absence of beta-blocker therapy.  He is anticoagulated with Eliquis with stable anemia noted earlier this week.  4.  Hyperlipidemia: LDL of 21 on December 9.  He is on statin therapy.  5.  Nonobstructive CAD: Noted at time of catheterization prior to aortic valve/ascending aortic surgery in 2010.  Denies chest pain.  Normal LV function by recent echo.  He remains on statin therapy.  No aspirin in the setting of chronic Eliquis.  6.  Bioprosthetic aortic valve: Normal functioning valve on recent echocardiogram.  7.  Hypomagnesemia: Magnesium is low at 1.1 during recent hospitalization with ongoing oral supplementation.  Plan to follow-up magnesium when he returns on Monday.  8.  Bilateral lower extremity cellulitis: Though currently swollen, no erythema or drainage.  He completed a course of Keflex and doxycycline earlier this week.  9.  Disposition: Follow-up next Monday.  Basic metabolic panel and magnesium upon follow-up.  Murray Hodgkins, NP 05/07/2022, 2:26 PM

## 2022-05-07 NOTE — Telephone Encounter (Signed)
Please make sure the note below was not documented in the incorrect chart. This patient needs to be rescheduled for his hospital follow-up. I understand his frustration with needing to be rescheduled though I have been out of the office with an illness and could not come in today. Please see if there is anything available with another provider. I am forwarding to Juliann Pulse so that she may also speak with the patient given that he noted he was irritated.

## 2022-05-07 NOTE — Progress Notes (Unsigned)
Date:  05/10/2022   ID:  Bryan [name], DOB 02-09-1941, MRN [id number]  Patient Location:  [place] [id number]   Provider location:   [facility], [place] office  PCP:  Bryan Haven, MD  Cardiologist:  Bryan [name] [facility]  Chief Complaint  Patient presents with   Follow-up    Patient c/o bilateral LE edema. Medications reviewed by the patient verbally.     History of Present Illness:    Bryan [name] is a 81 y.o. 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 bicuspid aortic valve noted in 2009   cardiac catheterization showing mild to moderate CAD,  AVR with bioprosthetic valve, ascending aorta grafting in 2010,  hypertension,  hyperlipidemia,  mild chronic renal insufficiency, total knee replacement in 2004, appendix rupture and gallbladder surgery in 2010,  postoperative atrial fibrillation in 2010 pulm HTN on echo 2017 Valve with mean gradient 20 mm Hg, moderate, prosthetic valve who presents for routine follow-up of his bioprosthetic aortic valve and dilated ascending aorta , permanent atrial fibrillation  Last seen by myself in clinic March 2023 Since that time has had several hospitalizations as detailed below Underwent [name] hemicolectomy with 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 presented with abdominal pain, nausea, and vomiting in early November, was found to have CT findings concerning for mechanical obstruction.    developed atrial fibrillation with rapid ventricular response while in the emergency department and was transferred to [facility].   exploratory laparotomy on April 04 2022 with mesh removal and small bowel resection with primary anastomosis.  on December 8, who presented back to the [facility] emergency department with progressive dyspnea and edema.  BNP was elevated at 412.  Chest x-ray showed pulmonary edema.  Weight was  up 4.6 kg on presentation.  He responded well to intravenous Lasix.    Transthoracic echocardiogram on May 03 2022 he, showed normal LV function with normal functioning bioprosthetic aortic valve, severely dilated left atrium, moderately dilated [name] atrium, and normal RV function.   treated for bilateral lower extremity cellulitis and discharged home on Lasix 40 mg daily, doxycycline, and Keflex.  Prior home doses of amlodipine and lisinopril were discontinued.   On last clinic visit in cardiology, May 07, 2022 had worsening leg swelling He was changed to Lasix 80 twice daily, recommendation made to change his salty diet Hemoglobin 8.3  He reports having 50 pound weight loss over the past several months from 210 down to 157, albumin less than 3 Hemoglobin remains severely depressed Reports he is taking 1 iron pill a day, recent iron numbers running low  EKG personally reviewed by myself on todays visit Atrial fibrillation rate 83 bpm no significant ST-T wave changes, left axis deviation  Other past medical history reviewed Event monitor Atrial Fibrillation occurred continuously (100% burden), ranging from 31- 134 bpm (avg of 62 bpm). Slow ventricular rate noted overnight, early a.m. Isolated VEs were rare (<1.0%), and no VE Couplets or VE Triplets were present. 1 patient triggered event, not associated with significant arrhythmia other than atrial fibrillation  confusion January 09, 2020, was seen in the emergency room was not making sense, had a headache, was vomiting Was seen in the emergency room but left secondary to high patient volume and he was feeling better Feels back to his baseline today  Seen in CHF clinic January 02, 2020, weight at that time 162 pounds  Weight  November 23, 2019 166 pounds Weight April 2021 was 160 pounds Weight in oncology 178 pounds performed yesterday His home weight today 168 pounds but is 173 pounds in our office  Echocardiogram November 13, 2019  performed for shortness of breath Normal ejection fraction, moderate pulmonary hypertension Moderate aortic valve stenosis Mean gradient 26 mmHg Dilated IVC  CT scan chest June 2021 with mild/small pleural effusion  Chronic back pain, multiple surgery "better" had hardware taken out Several back surgeries Back surgery  5/18 chroni back and leg pain, Fx of L4, Cement  Fusion  Past Medical History:  Diagnosis Date   Abnormal CT scan    Asymmetric left rectal wall thickening    Anemia    Anxiety    Basal cell carcinoma 04/28/2020   L forehead - ED&C 06/17/2020   Basal cell carcinoma 05/06/2021   left scalp postauricular ant, EDC   Basal cell carcinoma 05/06/2021   left scalp postauricular post, EDC   Chronic heart failure with preserved ejection fraction (HFpEF) ([place])    a. 02/2022 Echo: EF 60-65%; b. 04/2022 Echo: EF >55%, nl prosth valve fx, sev dil LA, nl RV fxn, mod dil RA.   Chronic lower back pain    Complication of anesthesia    Memory loss 09/2015   Coronary artery disease    a. 2010 Cath: mild-mod CAD per notes.   Depression    GERD (gastroesophageal reflux disease)    Glaucoma    H/O thoracic aortic aneurysm repair    Headache    Heart murmur    History of being hospitalized    memory lose kidney funtion down blood pressure up   History of blood transfusion    "think he had one when he had heart valve OR" (10/14/2016); "none since" (10/19/2017)   History of chicken pox    Hyperlipidemia    Hypertension    Lumbar stenosis    Murmur    Neuropathy    Osteoarthritis    worse in feet and ankles   Permanent atrial fibrillation ([place])    a. CHA2DS2VASc = 5 -->eliquis   Poor short term memory    takes Aricept   Rheumatoid arthritis ([place])    "all over" (10/14/2016)   Schizophrenia ([place])    Seasonal allergies    Thoracic spine pain    Valvular heart disease    Wears glasses    reading   Past Surgical History:  Procedure Laterality Date   AORTIC VALVE  REPLACEMENT  2007   [facility].  [place], [place]; "pig valve"   APPENDECTOMY  2010   BACK SURGERY     CARDIAC VALVE REPLACEMENT     CATARACT EXTRACTION W/ INTRAOCULAR LENS  IMPLANT, BILATERAL Bilateral 2012   COLONOSCOPY WITH PROPOFOL N/A 09/24/2016   Procedure: COLONOSCOPY WITH PROPOFOL;  Surgeon: Jonathon Bellows, MD;  Location: [facility] ENDOSCOPY;  Service: Endoscopy;  Laterality: N/A;   ESOPHAGOGASTRODUODENOSCOPY (EGD) WITH PROPOFOL N/A 09/24/2016   Procedure: ESOPHAGOGASTRODUODENOSCOPY (EGD) WITH PROPOFOL;  Surgeon: Jonathon Bellows, MD;  Location: [facility] ENDOSCOPY;  Service: Endoscopy;  Laterality: N/A;   GIVENS CAPSULE STUDY N/A 09/24/2016   Procedure: GIVENS CAPSULE STUDY;  Surgeon: Jonathon Bellows, MD;  Location: [facility] ENDOSCOPY;  Service: Endoscopy;  Laterality: N/A;   HAMMER TOE SURGERY [name] 10/09/2015   Procedure: HAMMER TOE REPAIR WITH K-WIRE FIXATION [name] SECOND TOE;  Surgeon: Albertine Patricia, DPM;  Location: [place];  Service: Podiatry;  Laterality: [name];  WITH LOCAL   HARDWARE REMOVAL N/A  11/02/2019   Procedure: Removal of hardware thoracic spine;  Surgeon: Kary Kos, MD;  Location: [place];  Service: Neurosurgery;  Laterality: N/A;  posterior   INGUINAL HERNIA REPAIR [name] 05/05/2018   Medium Bard PerFix plug.  Surgeon: Robert Bellow, MD;  Location: [facility] ORS;  Service: General;  Laterality: [name];   IR VERTEBROPLASTY CERV/THOR BX INC UNI/BIL INC/INJECT/IMAGING  10/27/2018   JOINT REPLACEMENT Left    left total knee   LAMINECTOMY WITH POSTERIOR LATERAL ARTHRODESIS LEVEL 3 N/A 09/28/2017   Procedure: LUMBAR  POSTEROR  FUSION REVISION - LUMBAR ONE -TWO, LUMBAR TWO -THREE,THREE-FOUR ,BILATERAL ARTHRODESIS REMOVAL LUMBAR THREE HARDWARE;  Surgeon: Kary Kos, MD;  Location: [place];  Service: Neurosurgery;  Laterality: N/A;   LAMINECTOMY WITH POSTERIOR LATERAL ARTHRODESIS LEVEL 3 N/A 10/20/2017   Procedure: Revision fusion and removal of hardware Lumbar one, Pedicle screw fixation  thoracic ten-lumbar two, thoracic nine-ten laminotomy, Posterior Lumbar Arthrodesis thoracic ten-lumbar two ;  Surgeon: Kary Kos, MD;  Location: [place];  Service: Neurosurgery;  Laterality: N/A;   LAPAROSCOPIC CHOLECYSTECTOMY  2010   LUMBAR FUSION [name] 06/08/2017   LUMBAR THREE-FOUR, LUMBAR FOUR-FIVE POSTEROLATERAL ARTHRODESIS WITH [name] LUMBAR FOUR-FIVE LAMINECTOMY/FORAMINOTOMY   LUMBAR LAMINECTOMY/DECOMPRESSION MICRODISCECTOMY [name] 03/08/2016   Procedure: Laminectomy and Foraminotomy - Lumbar Five-Sacral One [name];  Surgeon: Kary Kos, MD;  Location: [place];  Service: Neurosurgery;  Laterality: [name];  [name]   MULTIPLE TOOTH EXTRACTIONS     "3"   OTOPLASATY [name] 12/15/2021   Procedure: [name] auricle repair;  Surgeon: Margaretha Sheffield, MD;  Location: [facility] ORS;  Service: ENT;  Laterality: [name];   POSTERIOR LUMBAR FUSION  10/14/2016   L5-S1   REPLACEMENT TOTAL KNEE Left 2003   THORACIC AORTIC ANEURYSM REPAIR  2010   TOTAL KNEE ARTHROPLASTY [name] 08/2021     Current Meds  Medication Sig   apixaban (ELIQUIS) 5 MG TABS tablet Take 5 mg by mouth 2 (two) times daily.   atorvastatin (LIPITOR) 40 MG tablet TAKE 1 TABLET BY MOUTH DAILY   calcium carbonate (OS-CAL - DOSED IN MG OF ELEMENTAL CALCIUM) 1250 (500 Ca) MG tablet Take 1 tablet by mouth daily with lunch.   donepezil (ARICEPT) 10 MG tablet Take 1 tablet by mouth at bedtime.   feeding supplement (ENSURE ENLIVE / ENSURE PLUS) LIQD Take 237 mLs by mouth 2 (two) times daily between meals.   fenofibrate 160 MG tablet Take 160 mg by mouth daily.   ferrous sulfate 325 (65 FE) MG tablet Take 325 mg by mouth daily with breakfast.   FLUoxetine (PROZAC) 20 MG capsule TAKE 1 CAPSULE BY MOUTH ONCE DAILY   furosemide (LASIX) 40 MG tablet Take 80 mg by mouth 2 (two) times daily.   gabapentin (NEURONTIN) 100 MG capsule Take 1 capsule (100 mg total) by mouth 3 (three) times daily.   HYDROcodone-acetaminophen (NORCO) 10-325 MG tablet Take 1 tablet by mouth  4 (four) times daily as needed for moderate pain.   hydrocortisone cream 1 % Apply 1 application topically daily as needed for itching.   ipratropium (ATROVENT) 0.06 % nasal spray Place 2 sprays into both nostrils 4 (four) times daily.   loperamide (IMODIUM) 2 MG capsule Take 1 capsule (2 mg total) by mouth as needed for diarrhea or loose stools.   loratadine (CLARITIN) 10 MG tablet Take 10 mg by mouth daily.   magnesium oxide (MAG-OX) 400 MG tablet Take 400 mg by mouth daily.   melatonin 5 MG TABS Take 1 tablet (5 mg total)  by mouth at bedtime as needed.   Multiple Vitamins-Minerals (PRESERVISION AREDS 2) CAPS Take 1 capsule by mouth daily.   omeprazole (PRILOSEC) 40 MG capsule TAKE 1 CAPSULE EVERY DAY   potassium chloride (KLOR-CON M) 10 MEQ tablet Take 1 tablet by mouth daily.   vitamin B-12 (CYANOCOBALAMIN) 1000 MCG tablet Take 1,000 mcg by mouth daily with lunch.    Vitamin D, Ergocalciferol, (DRISDOL) 1.25 MG (50000 UNIT) CAPS capsule Take 1 capsule by mouth on Monday and Thursday.   vitamin E 180 MG (400 UNITS) capsule Take 800 Units by mouth daily with lunch.     Allergies:   Penicillins, Demerol [meperidine], and Other   Social History   Tobacco Use   Smoking status: Former    Packs/day: 1.00    Years: 32.00    Total pack years: 32.00    Types: Cigarettes    Quit date: 07/05/1981    Years since quitting: 40.8   Smokeless tobacco: Never  Vaping Use   Vaping Use: Never used  Substance Use Topics   Alcohol use: Not Currently    Comment: "glass of wine/month; if that"   Drug use: Never     Family Hx: The patient's family history includes Asthma in his mother; Heart attack (age of onset: 39) in his father; Heart failure in his mother; Hypertension in his mother and sister; Osteoporosis in his mother. There is no history of Prostate cancer, Bladder Cancer, Kidney cancer, or Colon cancer.  ROS:   Please see the history of present illness.    Review of Systems   Constitutional:  Positive for malaise/fatigue and weight loss.  HENT: Negative.    Respiratory: Negative.    Cardiovascular:  Positive for leg swelling.  Gastrointestinal: Negative.   Musculoskeletal: Negative.   Neurological: Negative.   Psychiatric/Behavioral: Negative.    All other systems reviewed and are negative.    Labs/Other Tests and Data Reviewed:    Recent Labs: 04/26/2022: Brain Natriuretic Peptide 208; TSH 1.02 04/30/2022: ALT 10; BUN 36; Creatinine, Ser 1.25; Hemoglobin 7.9 Repeated and verified X2.; Platelets 147.0; Potassium 4.8; Sodium 138   Recent Lipid Panel Lab Results  Component Value Date/Time   CHOL 88 08/22/2020 12:07 PM   CHOL 123 09/11/2019 03:38 PM   TRIG 132.0 08/22/2020 12:07 PM   HDL 38.40 (L) 08/22/2020 12:07 PM   HDL 58 09/11/2019 03:38 PM   CHOLHDL 2 08/22/2020 12:07 PM   LDLCALC 23 08/22/2020 12:07 PM   LDLCALC 47 09/11/2019 03:38 PM    Wt Readings from Last 3 Encounters:  05/10/22 157 lb 6 oz (71.4 kg)  05/07/22 155 lb (70.3 kg)  04/26/22 162 lb 9.6 oz (73.8 kg)     Exam:    BP (!) 140/70 (BP Location: Left Arm, Patient Position: Sitting, Cuff Size: Normal)   Pulse 83   Ht 6' (1.829 m)   Wt 157 lb 6 oz (71.4 kg)   SpO2 97%   BMI 21.34 kg/m  Constitutional:  oriented to person, place, and time. No distress.  Appears pale HENT:  Head: Grossly normal Eyes:  no discharge. No scleral icterus.  Neck: No JVD, no carotid bruits  Cardiovascular: Regular rate and rhythm, no murmurs appreciated, 2+ pitting edema to below the knees Pulmonary/Chest: Clear to auscultation bilaterally, no wheezes or rails Abdominal: Soft.  no distension.  no tenderness.  Musculoskeletal: Normal range of motion Neurological:  normal muscle tone. Coordination normal. No atrophy Skin: Skin warm and dry Psychiatric: normal affect, pleasant  ASSESSMENT & PLAN:    Permanent atrial fibrillation (HCC) -  Permanent atrial fibrillation, rate  well-controlled Tolerating Eliquis 5 mg twice daily Metoprolol previously held for bradycardia   Essential hypertension - Plan: EKG 12-Lead Blood pressure is well controlled on today's visit. No changes made to the medications.  Anemia Low iron, taking 1 pill/day, recommend increase up up to 3 times daily dosing Will discuss with Dr. Caryl Bis whether he needs iron infusion  Lower extremity edema Likely exacerbated by anemia, 50 pound weight loss, low protein On he recently started to do better with his nutrition, drinking nutrition shakes Hemoglobin remains low, likely exacerbated by low iron   CAD in native artery - Plan: EKG 12-Lead Currently with no symptoms of angina. No further workup at this time. Continue current medication regimen.  Will do better with hemoglobin 10 or higher  Acute on chronic diastolic CHF For now we will continue Lasix 40 twice daily, BMP pending today Leg swelling likely predominantly from anemia and low albumin No significant change in weight with higher dose Lasix, in fact is up 2 pounds  Aneurysm of ascending aorta, s/post repair/grafting Aortic root/ascending aorta has been repaired/replaced. There is mild  dilatation of the aortic root measuring 44 mm. Stable on recent echocardiogram   Mixed hyperlipidemia Cholesterol is at goal on the current lipid regimen. No changes to the medications were made.  S/P AVR (aortic valve replacement) Stable on recent echo   Cognitive decline Presenting with family today, appears stable    Total encounter time more than 40 minutes  Greater than 50% was spent in counseling and coordination of care with the patient   Signed, Ida Rogue, MD  05/10/2022 3:09 PM    [place] [facility] [id number], [place], [place] [address]

## 2022-05-07 NOTE — Addendum Note (Signed)
Addended by: Nanci Pina on: 05/07/2022 02:53 PM   Modules accepted: Orders

## 2022-05-08 NOTE — Telephone Encounter (Signed)
Order signed.

## 2022-05-08 NOTE — Addendum Note (Signed)
Addended by: Caryl Bis Esli Jernigan G on: 05/08/2022 03:20 PM   Modules accepted: Orders

## 2022-05-10 ENCOUNTER — Other Ambulatory Visit
Admission: RE | Admit: 2022-05-10 | Discharge: 2022-05-10 | Disposition: A | Discharge status: Home / Self Care | Payer: Medicare (Managed Care) | Source: Ambulatory Visit | Attending: Nurse Practitioner | Admitting: Nurse Practitioner

## 2022-05-10 ENCOUNTER — Encounter: Payer: Self-pay | Admitting: Cardiovascular Disease

## 2022-05-10 ENCOUNTER — Telehealth: Payer: Self-pay | Admitting: *Deleted

## 2022-05-10 ENCOUNTER — Ambulatory Visit: Payer: Medicare (Managed Care) | Attending: Cardiovascular Disease | Admitting: Cardiovascular Disease

## 2022-05-10 VITALS — BP 140/70 | HR 83 | Ht 72.0 in | Wt 157.4 lb

## 2022-05-10 DIAGNOSIS — I10 Essential (primary) hypertension: Secondary | ICD-10-CM | POA: Diagnosis not present

## 2022-05-10 DIAGNOSIS — I7121 Aneurysm of the ascending aorta, without rupture: Secondary | ICD-10-CM | POA: Diagnosis not present

## 2022-05-10 DIAGNOSIS — I4821 Permanent atrial fibrillation: Secondary | ICD-10-CM

## 2022-05-10 DIAGNOSIS — E782 Mixed hyperlipidemia: Secondary | ICD-10-CM | POA: Diagnosis not present

## 2022-05-10 DIAGNOSIS — I5032 Chronic diastolic (congestive) heart failure: Secondary | ICD-10-CM | POA: Diagnosis not present

## 2022-05-10 DIAGNOSIS — I5033 Acute on chronic diastolic (congestive) heart failure: Secondary | ICD-10-CM | POA: Diagnosis not present

## 2022-05-10 DIAGNOSIS — Z952 Presence of prosthetic heart valve: Secondary | ICD-10-CM

## 2022-05-10 DIAGNOSIS — I272 Pulmonary hypertension, unspecified: Secondary | ICD-10-CM

## 2022-05-10 LAB — MAGNESIUM: Magnesium: 1.5 mg/dL — ABNORMAL LOW (ref 1.7–2.4)

## 2022-05-10 LAB — BASIC METABOLIC PANEL
Anion gap: 7 (ref 5–15)
BUN: 34 mg/dL — ABNORMAL HIGH (ref 8–23)
CO2: 28 mmol/L (ref 22–32)
Calcium: 8.7 mg/dL — ABNORMAL LOW (ref 8.9–10.3)
Chloride: 103 mmol/L (ref 98–111)
Creatinine, Ser: 1.35 mg/dL — ABNORMAL HIGH (ref 0.61–1.24)
GFR, Estimated: 52 mL/min — ABNORMAL LOW (ref >60)
Glucose, Bld: 109 mg/dL — ABNORMAL HIGH (ref 70–99)
Potassium: 5 mmol/L (ref 3.5–5.1)
Sodium: 138 mmol/L (ref 135–145)

## 2022-05-10 MED ORDER — APIXABAN 5 MG PO TABS: 5.0000 mg | ORAL_TABLET | Freq: Two times a day (BID) | ORAL | 3 refills | Status: DC

## 2022-05-10 MED ORDER — FENOFIBRATE 160 MG PO TABS: 160.0000 mg | ORAL_TABLET | Freq: Every day | ORAL | 3 refills | Status: DC

## 2022-05-10 MED ORDER — POTASSIUM CHLORIDE CRYS ER 10 MEQ PO TBCR: 10.0000 meq | EXTENDED_RELEASE_TABLET | Freq: Every day | ORAL | 3 refills | Status: DC

## 2022-05-10 NOTE — Progress Notes (Signed)
  Care Coordination  Outreach Note  05/10/2022 Name: JERAMI TAMMEN MRN: [id number] DOB: 10-30-1940   Care Coordination Outreach Attempts: An unsuccessful telephone outreach was attempted today to offer the patient information about available care coordination services as a benefit of their health plan.   Referral received   Follow Up Plan:  Additional outreach attempts will be made to offer the patient care coordination information and services.   Encounter Outcome:  No Answer  Julian Hy, [place] Direct Dial: [phone]

## 2022-05-10 NOTE — Patient Instructions (Addendum)
Medication Instructions:  No changes  If you need a refill on your cardiac medications before your next appointment, please call your pharmacy.   Lab work: Your physician recommends that you return for lab work in [place] at [employer].  -BMP and Magnesium  Testing/Procedures: No new testing needed  Follow-Up: At [facility], you and your health needs are our priority.  As part of our continuing mission to provide you with exceptional heart care, we have created designated Provider Care Teams.  These Care Teams include your primary Cardiologist (physician) and Advanced Practice Providers (APPs -  Physician Assistants and Nurse Practitioners) who all work together to provide you with the care you need, when you need it.  You will need a follow up appointment in 3 months  Providers on your designated Care Team:   Murray Hodgkins, NP Christell Faith, PA-C Cadence Kathlen Mody, [place]  COVID-19 Vaccine Information can be found at: ShippingScam.co.uk For questions related to vaccine distribution or appointments, please email [email or web address] .[email or web address] or call [phone].

## 2022-05-11 ENCOUNTER — Telehealth: Payer: Self-pay | Admitting: Family Medicine

## 2022-05-11 DIAGNOSIS — I119 Hypertensive heart disease without heart failure: Secondary | ICD-10-CM | POA: Diagnosis not present

## 2022-05-11 DIAGNOSIS — Z7901 Long term (current) use of anticoagulants: Secondary | ICD-10-CM | POA: Diagnosis not present

## 2022-05-11 DIAGNOSIS — E785 Hyperlipidemia, unspecified: Secondary | ICD-10-CM | POA: Diagnosis not present

## 2022-05-11 DIAGNOSIS — M6281 Muscle weakness (generalized): Secondary | ICD-10-CM | POA: Diagnosis not present

## 2022-05-11 DIAGNOSIS — I4891 Unspecified atrial fibrillation: Secondary | ICD-10-CM | POA: Diagnosis not present

## 2022-05-11 DIAGNOSIS — I2510 Atherosclerotic heart disease of native coronary artery without angina pectoris: Secondary | ICD-10-CM | POA: Diagnosis not present

## 2022-05-11 DIAGNOSIS — I5033 Acute on chronic diastolic (congestive) heart failure: Secondary | ICD-10-CM | POA: Diagnosis not present

## 2022-05-11 DIAGNOSIS — J432 Centrilobular emphysema: Secondary | ICD-10-CM | POA: Diagnosis not present

## 2022-05-11 DIAGNOSIS — L03116 Cellulitis of left lower limb: Secondary | ICD-10-CM | POA: Diagnosis not present

## 2022-05-11 DIAGNOSIS — L03115 Cellulitis of right lower limb: Secondary | ICD-10-CM | POA: Diagnosis not present

## 2022-05-11 NOTE — Telephone Encounter (Signed)
Seeking approval for physical therapy 2 week 1 and 1 week 3. Call [place] # [phone] number is secure.

## 2022-05-12 ENCOUNTER — Telehealth: Payer: Self-pay | Admitting: Oncology

## 2022-05-12 ENCOUNTER — Inpatient Hospital Stay: Payer: Medicare (Managed Care) | Admitting: Family Medicine

## 2022-05-12 NOTE — Progress Notes (Signed)
  Care Coordination   Note   05/12/2022 Name: Bryan [name] MRN: [id number] DOB: [birth year]  Bryan [name] is a 81 y.o. year old male who sees Caryl Bis, Angela Adam, MD for primary care. I reached out to Bryan [name] by phone today to offer care coordination services.  Mr. Parkison was given information about Care Coordination services today including:   The Care Coordination services include support from the care team which includes your Nurse Coordinator, Clinical Social Worker, or Pharmacist.  The Care Coordination team is here to help remove barriers to the health concerns and goals most important to you. Care Coordination services are voluntary, and the patient may decline or stop services at any time by request to their care team member.   Care Coordination Consent Status: Patient agreed to services and verbal consent obtained.   Follow up plan:  Telephone appointment with care coordination team member scheduled for:  05/20/2022  Encounter Outcome:  Pt. Scheduled from referral   Julian Hy, [place] Direct Dial: [phone]

## 2022-05-12 NOTE — Progress Notes (Signed)
  Care Coordination  Outreach Note  05/12/2022 Name: DATRELL DUNTON MRN: [id number] DOB: 1941/04/06   Care Coordination Outreach Attempts: A second unsuccessful outreach was attempted today to offer the patient with information about available care coordination services as a benefit of their health plan.     Referral received   Follow Up Plan:  Additional outreach attempts will be made to offer the patient care coordination information and services.   Encounter Outcome:  No Answer  Julian Hy, [place] Direct Dial: [phone]

## 2022-05-12 NOTE — Telephone Encounter (Signed)
Vm left with patient requesting he call back to see Dr. Janese Banks in the next 1-2 weeks (no labs). Message was sent from Dr. Caryl Bis, who recommended heme f/u.

## 2022-05-12 NOTE — Telephone Encounter (Signed)
Verbal orders can be given. 

## 2022-05-12 NOTE — Telephone Encounter (Signed)
Verbal orders were called in and given to [facility].  Seve Monette,cma

## 2022-05-13 NOTE — Addendum Note (Signed)
Addended by: James Ivanoff D on: 05/13/2022 04:01 PM   Modules accepted: Orders

## 2022-05-14 ENCOUNTER — Inpatient Hospital Stay: Payer: Medicare (Managed Care) | Admitting: Family Medicine

## 2022-05-19 ENCOUNTER — Telehealth: Payer: Self-pay | Admitting: Family Medicine

## 2022-05-19 DIAGNOSIS — Z7901 Long term (current) use of anticoagulants: Secondary | ICD-10-CM | POA: Diagnosis not present

## 2022-05-19 DIAGNOSIS — R5381 Other malaise: Secondary | ICD-10-CM | POA: Diagnosis not present

## 2022-05-19 DIAGNOSIS — Z885 Allergy status to narcotic agent status: Secondary | ICD-10-CM | POA: Diagnosis not present

## 2022-05-19 DIAGNOSIS — I4891 Unspecified atrial fibrillation: Secondary | ICD-10-CM | POA: Diagnosis not present

## 2022-05-19 DIAGNOSIS — Z9049 Acquired absence of other specified parts of digestive tract: Secondary | ICD-10-CM | POA: Diagnosis not present

## 2022-05-19 DIAGNOSIS — Z952 Presence of prosthetic heart valve: Secondary | ICD-10-CM | POA: Diagnosis not present

## 2022-05-19 DIAGNOSIS — R350 Frequency of micturition: Secondary | ICD-10-CM | POA: Diagnosis not present

## 2022-05-19 DIAGNOSIS — J439 Emphysema, unspecified: Secondary | ICD-10-CM | POA: Diagnosis not present

## 2022-05-19 DIAGNOSIS — D649 Anemia, unspecified: Secondary | ICD-10-CM | POA: Diagnosis not present

## 2022-05-19 DIAGNOSIS — Q231 Congenital insufficiency of aortic valve: Secondary | ICD-10-CM | POA: Diagnosis not present

## 2022-05-19 DIAGNOSIS — M810 Age-related osteoporosis without current pathological fracture: Secondary | ICD-10-CM | POA: Diagnosis not present

## 2022-05-19 DIAGNOSIS — M545 Low back pain, unspecified: Secondary | ICD-10-CM | POA: Diagnosis not present

## 2022-05-19 DIAGNOSIS — Z91199 Patient's noncompliance with other medical treatment and regimen due to unspecified reason: Secondary | ICD-10-CM | POA: Diagnosis not present

## 2022-05-19 DIAGNOSIS — Z933 Colostomy status: Secondary | ICD-10-CM | POA: Diagnosis not present

## 2022-05-19 DIAGNOSIS — Z79899 Other long term (current) drug therapy: Secondary | ICD-10-CM | POA: Diagnosis not present

## 2022-05-19 DIAGNOSIS — R0602 Shortness of breath: Secondary | ICD-10-CM | POA: Diagnosis not present

## 2022-05-19 DIAGNOSIS — Z880 Allergy status to penicillin: Secondary | ICD-10-CM | POA: Diagnosis not present

## 2022-05-19 NOTE — Telephone Encounter (Signed)
Patient called stating he is having SOB, urinating frequently BP 164/77. Has not taken lasix and his weight is down.

## 2022-05-19 NOTE — Telephone Encounter (Signed)
Noted. Please follow-up with the patient later today to make sure he went to get evaluated.

## 2022-05-20 ENCOUNTER — Encounter: Payer: Medicare (Managed Care) | Admitting: *Deleted

## 2022-05-21 ENCOUNTER — Telehealth: Payer: Self-pay | Admitting: Oncology

## 2022-05-21 ENCOUNTER — Telehealth: Payer: Self-pay | Admitting: *Deleted

## 2022-05-21 NOTE — Telephone Encounter (Signed)
Attempt made to reach patient. Per Dr. Janese Banks, she would like him to come into clinic to see her, Dr. Caryl Bis has reached out also.

## 2022-05-21 NOTE — Progress Notes (Signed)
  Care Coordination Note  05/21/2022 Name: Bryan [name] MRN: [id number] DOB: 07/03/1940  Bryan [name] is a 81 y.o. year old male who is a primary care patient of [name], MD and is actively engaged with the care management team. I reached out to Bryan [name] by phone today to assist with re-scheduling an initial visit with the Licensed Clinical Social Worker  Follow up plan: Patient declines further follow up and engagement by the care management team. Appropriate care team members and provider have been notified via electronic communication. Outreach to reschedule initial with LCSW - pt was recently in ED and per sister has f/u scheduled with [facility] next Thursday and social worker will be at appt as well   Julian Hy, [place] Direct Dial: [phone]

## 2022-05-21 NOTE — Telephone Encounter (Signed)
Patient's sister returned my phone call to let me know that he currently has an appt at [facility]. She will call us after this visit to let us know about any further appointments needed here.

## 2022-05-25 ENCOUNTER — Telehealth: Payer: Self-pay | Admitting: *Deleted

## 2022-05-25 NOTE — Telephone Encounter (Signed)
Left voicemail message to call back for review of results.  

## 2022-05-25 NOTE — Telephone Encounter (Signed)
-----   Message from Minna Merritts, MD sent at 05/23/2022  6:02 PM EST ----- Basic metabolic panel reviewed On May 10, 2022, renal function running little bit high on Lasix 40-80 twice a day Off Lasix for the past week of the year leading to BMP in the emergency room Would recommend restarting Lasix 40 daily Needs follow-up with hematology for chronic anemia Symptoms of shortness of breath, last be will be exacerbated by anemia Good news, albumin has started to recover from better nutrition

## 2022-05-27 ENCOUNTER — Other Ambulatory Visit: Payer: Self-pay | Admitting: Cardiovascular Disease

## 2022-05-31 NOTE — Telephone Encounter (Signed)
No call back from the patient. His proxy reviewed his results and MD recommendations in Mychart.   I did send a mychart message to the patient to follow up on his diuretic dose and his hematology status- see mychart message sent to the patient today.

## 2022-06-01 ENCOUNTER — Telehealth: Payer: Self-pay | Admitting: Cardiovascular Disease

## 2022-06-01 NOTE — Telephone Encounter (Signed)
Patient called wanting to speak to [facility]

## 2022-06-01 NOTE — Telephone Encounter (Signed)
Attempted to call the patient. No answer- I left a message to please call back.  There is another encounter already open for this same issue dated 05/25/22- will close this encounter- see 05/25/22 encounter.

## 2022-06-01 NOTE — Telephone Encounter (Signed)
Attempted to call the patient. No answer- I left a message to please call back.   A mychart message was received from the patient's proxy yesterday stating: Bryan [name] (proxy for Bryan CLASON "Josph Macho")  to Me  DW    05/31/22  3:21 PM Hi, Abbott Jasinski.  I'm [name]'s sister. [name] doesn't use MyChart and he's often hard to reach by phone. I copied your message & sent it in a text. Hopefully he will respond soon.    Thank you for following up.    PS:  Sending him letters is best. He's old school.

## 2022-06-01 NOTE — Telephone Encounter (Signed)
Ermelinda Das at 06/01/2022 10:03 AM  Status: Signed  Patient called wanting to speak to [facility].

## 2022-06-02 ENCOUNTER — Telehealth: Payer: Self-pay | Admitting: Family Medicine

## 2022-06-02 NOTE — Telephone Encounter (Signed)
I called and LVM for patient or his sister to call the office to clarify his message.  Xayden Linsey,cma

## 2022-06-02 NOTE — Telephone Encounter (Signed)
Patient's sister called to let office know that patient has decided to see a [facility] primary care provider. Patient's appointment is sometime in March, sister did not know excite date.

## 2022-06-02 NOTE — Telephone Encounter (Signed)
The patient called back to the office this morning. I advised him that I was trying to clarify his lasix dosing and the status of a hematology follow up.  Per the patient, he has been taking lasix 40 mg once daily, but is adamant that he has decided not to take this any longer due to the fact that it keeps him in the bathroom I inquired if he has tried taking lasix 20 mg once daily to see if he can tolerate this dose. Per the patient, he has not thought about trying lasix 20 mg once daily, but will consider this.   He advised that in relation to his hematology follow up, he was waiting on a call from Dr. Elroy Channel office. He wanted to know if Dr. [name] thought he needed an "infusion."  I advised the patient that there are multiple phone notes from Dr. Elroy Channel office that they have tried to contact him about an appointment, but the last message from 05/21/22 states that his sister returned a call to Dr. Elroy Channel office and they would call back to schedule pending a follow up at [facility] on 05/27/22.  I advised that Dr. Caryl Bis had been trying to arrange this referral for him and until he sees hematology, I cannot advise if an infusion of any type is needed. Per the patient, he will call Dr. Elroy Channel office to schedule an appointment. I did provide him with the contact number at his request.   He also confirmed he his not seeing Dr. Caryl Bis any longer and is establishing his care at [facility].   I advised the patient I will notify Dr. [name] of the above so he is aware.  He was very appreciative of the call.

## 2022-06-02 NOTE — Telephone Encounter (Signed)
Patient called back and I wanted to confirm if he is switching to a different provider and he stated he is switching to a primary care provider with [facility] and he will be by tomorrow to sign a medical release.  Jaekwon Mcclune,cma

## 2022-06-03 ENCOUNTER — Telehealth: Payer: Self-pay | Admitting: Family Medicine

## 2022-06-03 DIAGNOSIS — I509 Heart failure, unspecified: Secondary | ICD-10-CM

## 2022-06-03 NOTE — Telephone Encounter (Signed)
[facility] called in staying that pt insurance has changed and that they will be discharging him because they are not in-Network. They stated that pt need a referral for a new [place] agency. They are available at [phone].

## 2022-06-04 ENCOUNTER — Telehealth: Payer: Self-pay | Admitting: Family Medicine

## 2022-06-04 NOTE — Telephone Encounter (Signed)
Noted. Please check with the patient to see when his appointment with his new PCP is. If it is not in the next week or so I can place another order for him though the follow-up paperwork will need to go to his new provider.

## 2022-06-04 NOTE — Telephone Encounter (Signed)
Home health order placed.

## 2022-06-04 NOTE — Telephone Encounter (Signed)
The patients sister stated she did not know the exact date but it is in March.  Erminio Nygard,cma

## 2022-06-04 NOTE — Telephone Encounter (Signed)
Lft pt vm to call ofc regarding referral. thanks

## 2022-06-07 ENCOUNTER — Ambulatory Visit: Payer: Medicare (Managed Care) | Admitting: Family Medicine

## 2022-06-09 ENCOUNTER — Telehealth: Payer: Self-pay | Admitting: Family Medicine

## 2022-06-09 NOTE — Telephone Encounter (Signed)
I spoke with pt about his referral to [facility]. Pt also states he was given the number to [facility] Dr Janese Banks and when he called he was advised Dr Janese Banks is not there. I gave pt the number to reach Dr Janese Banks at [facility]. Thanks

## 2022-06-09 NOTE — Telephone Encounter (Signed)
Pt called in today returning [place] phone call about a referral, he's available @ [phone].

## 2022-06-09 NOTE — Telephone Encounter (Signed)
I returned pt call. No answer call went busy. I was calling pt for pt to call his ins company to see who's in network with [facility]. Normally its [employer] and Christella Scheuermann does not have a contract with [employer]. Pt might want to do PT outpatient. thanks

## 2022-06-10 ENCOUNTER — Telehealth: Payer: Self-pay | Admitting: Oncology

## 2022-06-10 ENCOUNTER — Encounter: Payer: Self-pay | Admitting: Hematology and Oncology

## 2022-06-10 NOTE — Telephone Encounter (Signed)
Attempt made to reach patient on home and mobile #'s. VM left with request to call cancer center back. Dr. Janese Banks would like to schedule a f/u appointment with him asap.

## 2022-06-11 ENCOUNTER — Inpatient Hospital Stay: Payer: Medicare Other

## 2022-06-11 ENCOUNTER — Encounter: Payer: Self-pay | Admitting: Oncology

## 2022-06-11 ENCOUNTER — Inpatient Hospital Stay: Payer: Medicare Other | Attending: Oncology | Admitting: Oncology

## 2022-06-11 VITALS — BP 135/68 | HR 74 | Temp 98.7°F | Resp 17 | Wt 145.0 lb

## 2022-06-11 DIAGNOSIS — D649 Anemia, unspecified: Secondary | ICD-10-CM | POA: Diagnosis not present

## 2022-06-11 LAB — CBC WITH DIFFERENTIAL/PLATELET
Abs Immature Granulocytes: 0.03 10*3/uL (ref 0.00–0.07)
Basophils Absolute: 0 10*3/uL (ref 0.0–0.1)
Basophils Relative: 1 %
Eosinophils Absolute: 0.1 10*3/uL (ref 0.0–0.5)
Eosinophils Relative: 2 %
HCT: 29.7 % — ABNORMAL LOW (ref 39.0–52.0)
Hemoglobin: 9.4 g/dL — ABNORMAL LOW (ref 13.0–17.0)
Immature Granulocytes: 1 %
Lymphocytes Relative: 7 %
Lymphs Abs: 0.3 10*3/uL — ABNORMAL LOW (ref 0.7–4.0)
MCH: 30.7 pg (ref 26.0–34.0)
MCHC: 31.6 g/dL (ref 30.0–36.0)
MCV: 97.1 fL (ref 80.0–100.0)
Monocytes Absolute: 0.3 10*3/uL (ref 0.1–1.0)
Monocytes Relative: 7 %
Neutro Abs: 3.8 10*3/uL (ref 1.7–7.7)
Neutrophils Relative %: 82 %
Platelets: 178 10*3/uL (ref 150–400)
RBC: 3.06 MIL/uL — ABNORMAL LOW (ref 4.22–5.81)
RDW: 17 % — ABNORMAL HIGH (ref 11.5–15.5)
WBC: 4.6 10*3/uL (ref 4.0–10.5)
nRBC: 0 % (ref 0.0–0.2)

## 2022-06-11 LAB — COMPREHENSIVE METABOLIC PANEL
ALT: 15 U/L (ref 0–44)
AST: 29 U/L (ref 15–41)
Albumin: 3.9 g/dL (ref 3.5–5.0)
Alkaline Phosphatase: 67 U/L (ref 38–126)
Anion gap: 9 (ref 5–15)
BUN: 26 mg/dL — ABNORMAL HIGH (ref 8–23)
CO2: 26 mmol/L (ref 22–32)
Calcium: 8.6 mg/dL — ABNORMAL LOW (ref 8.9–10.3)
Chloride: 102 mmol/L (ref 98–111)
Creatinine, Ser: 1.23 mg/dL (ref 0.61–1.24)
GFR, Estimated: 58 mL/min — ABNORMAL LOW (ref >60)
Glucose, Bld: 116 mg/dL — ABNORMAL HIGH (ref 70–99)
Potassium: 4.1 mmol/L (ref 3.5–5.1)
Sodium: 137 mmol/L (ref 135–145)
Total Bilirubin: 0.6 mg/dL (ref 0.3–1.2)
Total Protein: 7.5 g/dL (ref 6.5–8.1)

## 2022-06-11 LAB — VITAMIN B12: Vitamin B-12: 333 pg/mL (ref 180–914)

## 2022-06-11 LAB — FOLATE: Folate: 14.7 ng/mL (ref >5.9)

## 2022-06-11 LAB — TSH: TSH: 0.889 u[IU]/mL (ref 0.350–4.500)

## 2022-06-11 LAB — FERRITIN: Ferritin: 125 ng/mL (ref 24–336)

## 2022-06-11 LAB — RETICULOCYTES
Immature Retic Fract: 12 % (ref 2.3–15.9)
RBC.: 3.09 MIL/uL — ABNORMAL LOW (ref 4.22–5.81)
Retic Count, Absolute: 62.7 10*3/uL (ref 19.0–186.0)
Retic Ct Pct: 2 % (ref 0.4–3.1)

## 2022-06-11 LAB — LACTATE DEHYDROGENASE: LDH: 217 U/L — ABNORMAL HIGH (ref 98–192)

## 2022-06-12 ENCOUNTER — Encounter: Payer: Self-pay | Admitting: Hematology and Oncology

## 2022-06-12 LAB — HAPTOGLOBIN: Haptoglobin: 202 mg/dL (ref 38–329)

## 2022-06-12 NOTE — Progress Notes (Signed)
Hematology/Oncology Consult note [facility]  [phone] [phone]  Patient Care Team: [name], MD as PCP - General (Family Medicine) [name] Kathlene November, MD as PCP - Cardiology (Cardiology) Minna Merritts, MD as Consulting Physician (Cardiology) Emmaline Kluver., MD (Rheumatology) Kary Kos, MD as Consulting Physician (Neurosurgery) Jonathon Bellows, MD as Consulting Physician (Gastroenterology)   Name of the patient: Bryan [name]  [id number]  09/19/1940   Date of visit: 06/12/22  Diagnosis-anemia of chronic disease  Chief complaint/ Reason for visit-reestablish follow-up for anemia of chronic disease  Heme/Onc history: Patient is a 82 year old male who has seen me in the past for 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  Hemoglobin at baseline runs around 10.  He is also had a bone marrow biopsy back in 2018 which revealed normocellular marrow with trilineage hematopoiesis and maturation with normal cytogenetics and FISH.  He has had history of iron deficiency anemia in the past and has required IV iron.  Interval history-patient is here to reestablish follow-up for his anemia.  He is not presently on Plaquenil or any medications for his rheumatoid arthritis.  ECOG PS- 1 Pain scale- 3   Review of systems- Review of Systems  Constitutional:  Positive for malaise/fatigue. Negative for chills, fever and weight loss.  HENT:  Negative for congestion, ear discharge and nosebleeds.   Eyes:  Negative for blurred vision.  Respiratory:  Negative for cough, hemoptysis, sputum production, shortness of breath and wheezing.   Cardiovascular:  Negative for chest pain, palpitations, orthopnea and claudication.  Gastrointestinal:  Negative for abdominal pain, blood in stool, constipation, diarrhea, heartburn, melena, nausea and vomiting.  Genitourinary:  Negative for dysuria, flank pain, frequency,  hematuria and urgency.  Musculoskeletal:  Negative for back pain, joint pain and myalgias.  Skin:  Negative for rash.  Neurological:  Negative for dizziness, tingling, focal weakness, seizures, weakness and headaches.  Endo/Heme/Allergies:  Does not bruise/bleed easily.  Psychiatric/Behavioral:  Negative for depression and suicidal ideas. The patient does not have insomnia.       Allergies  Allergen Reactions   Penicillins Hives, Swelling and Other (See Comments)    SWELLING REACTION UNSPECIFIED PATIENT HAS TAKEN AMOXICILLIN ON MED HX FROM [facility] PATIENT HAS HAD A PCN REACTION WITH IMMEDIATE RASH, FACIAL/TONGUE/THROAT SWELLING, SOB, OR LIGHTHEADEDNESS WITH HYPOTENSION:  #  #  #  YES  #  #  #   Has patient had a PCN reaction causing severe rash involving mucus membranes or skin necrosis: No Has patient had a PCN reaction that required hospitalization No Has patient had a PCN reaction occurring within the last 10 years: No   Demerol [Meperidine] Hives and Nausea And Vomiting   Other      Past Medical History:  Diagnosis Date   Abnormal CT scan    Asymmetric left rectal wall thickening    Anemia    Anxiety    Basal cell carcinoma 04/28/2020   L forehead - ED&C 06/17/2020   Basal cell carcinoma 05/06/2021   left scalp postauricular ant, EDC   Basal cell carcinoma 05/06/2021   left scalp postauricular post, EDC   Chronic heart failure with preserved ejection fraction (HFpEF) ([place])    a. 02/2022 Echo: EF 60-65%; b. 04/2022 Echo: EF >55%, nl prosth valve fx, sev dil LA, nl RV fxn, mod dil RA.   Chronic lower back pain    Complication of anesthesia    Memory loss 09/2015  Coronary artery disease    a. 2010 Cath: mild-mod CAD per notes.   Depression    GERD (gastroesophageal reflux disease)    Glaucoma    H/O thoracic aortic aneurysm repair    Headache    Heart murmur    History of being hospitalized    memory lose kidney funtion down blood pressure up   History of blood  transfusion    "think he had one when he had heart valve OR" (10/14/2016); "none since" (10/19/2017)   History of chicken pox    Hyperlipidemia    Hypertension    Lumbar stenosis    Murmur    Neuropathy    Osteoarthritis    worse in feet and ankles   Permanent atrial fibrillation ([place])    a. CHA2DS2VASc = 5 -->eliquis   Poor short term memory    takes Aricept   Rheumatoid arthritis ([place])    "all over" (10/14/2016)   Schizophrenia ([place])    Seasonal allergies    Thoracic spine pain    Valvular heart disease    Wears glasses    reading     Past Surgical History:  Procedure Laterality Date   AORTIC VALVE REPLACEMENT  2007   [facility].  [place], [place]; "pig valve"   APPENDECTOMY  2010   BACK SURGERY     CARDIAC VALVE REPLACEMENT     CATARACT EXTRACTION W/ INTRAOCULAR LENS  IMPLANT, BILATERAL Bilateral 2012   COLONOSCOPY WITH PROPOFOL N/A 09/24/2016   Procedure: COLONOSCOPY WITH PROPOFOL;  Surgeon: Jonathon Bellows, MD;  Location: [facility] ENDOSCOPY;  Service: Endoscopy;  Laterality: N/A;   ESOPHAGOGASTRODUODENOSCOPY (EGD) WITH PROPOFOL N/A 09/24/2016   Procedure: ESOPHAGOGASTRODUODENOSCOPY (EGD) WITH PROPOFOL;  Surgeon: Jonathon Bellows, MD;  Location: [facility] ENDOSCOPY;  Service: Endoscopy;  Laterality: N/A;   GIVENS CAPSULE STUDY N/A 09/24/2016   Procedure: GIVENS CAPSULE STUDY;  Surgeon: Jonathon Bellows, MD;  Location: [facility] ENDOSCOPY;  Service: Endoscopy;  Laterality: N/A;   HAMMER TOE SURGERY Right 10/09/2015   Procedure: HAMMER TOE REPAIR WITH K-WIRE FIXATION RIGHT SECOND TOE;  Surgeon: Albertine Patricia, DPM;  Location: [place];  Service: Podiatry;  Laterality: Right;  WITH LOCAL   HARDWARE REMOVAL N/A 11/02/2019   Procedure: Removal of hardware thoracic spine;  Surgeon: Kary Kos, MD;  Location: [place];  Service: Neurosurgery;  Laterality: N/A;  posterior   INGUINAL HERNIA REPAIR Right 05/05/2018   Medium Bard PerFix plug.  Surgeon: Robert Bellow, MD;  Location: [facility] ORS;   Service: General;  Laterality: Right;   IR VERTEBROPLASTY CERV/THOR BX INC UNI/BIL INC/INJECT/IMAGING  10/27/2018   JOINT REPLACEMENT Left    left total knee   LAMINECTOMY WITH POSTERIOR LATERAL ARTHRODESIS LEVEL 3 N/A 09/28/2017   Procedure: LUMBAR  POSTEROR  FUSION REVISION - LUMBAR ONE -TWO, LUMBAR TWO -THREE,THREE-FOUR ,BILATERAL ARTHRODESIS REMOVAL LUMBAR THREE HARDWARE;  Surgeon: Kary Kos, MD;  Location: [place];  Service: Neurosurgery;  Laterality: N/A;   LAMINECTOMY WITH POSTERIOR LATERAL ARTHRODESIS LEVEL 3 N/A 10/20/2017   Procedure: Revision fusion and removal of hardware Lumbar one, Pedicle screw fixation thoracic ten-lumbar two, thoracic nine-ten laminotomy, Posterior Lumbar Arthrodesis thoracic ten-lumbar two ;  Surgeon: Kary Kos, MD;  Location: [place];  Service: Neurosurgery;  Laterality: N/A;   LAPAROSCOPIC CHOLECYSTECTOMY  2010   LUMBAR FUSION Right 06/08/2017   LUMBAR THREE-FOUR, LUMBAR FOUR-FIVE POSTEROLATERAL ARTHRODESIS WITH RIGHT LUMBAR FOUR-FIVE LAMINECTOMY/FORAMINOTOMY   LUMBAR LAMINECTOMY/DECOMPRESSION MICRODISCECTOMY Right 03/08/2016   Procedure: Laminectomy and Foraminotomy - Lumbar Five-Sacral One Right;  Surgeon: Kary Kos, MD;  Location: [place];  Service: Neurosurgery;  Laterality: Right;  Right   MULTIPLE TOOTH EXTRACTIONS     "3"   OTOPLASATY Right 12/15/2021   Procedure: Right auricle repair;  Surgeon: Margaretha Sheffield, MD;  Location: [facility] ORS;  Service: ENT;  Laterality: Right;   POSTERIOR LUMBAR FUSION  10/14/2016   L5-S1   REPLACEMENT TOTAL KNEE Left 2003   THORACIC AORTIC ANEURYSM REPAIR  2010   TOTAL KNEE ARTHROPLASTY Right 08/2021    Social History   Socioeconomic History   Marital status: Widowed    Spouse name: Not on file   Number of children: Not on file   Years of education: Not on file   Highest education level: Not on file  Occupational History   Occupation: retired  Tobacco Use   Smoking status: Former    Packs/day: 1.00    Years: 32.00     Total pack years: 32.00    Types: Cigarettes    Quit date: 07/05/1981    Years since quitting: 40.9   Smokeless tobacco: Never  Vaping Use   Vaping Use: Never used  Substance and Sexual Activity   Alcohol use: Not Currently    Comment: "glass of wine/month; if that"   Drug use: Never   Sexual activity: Not Currently  Other Topics Concern   Not on file  Social History Narrative   Not on file   Social Determinants of Health   Financial Resource Strain: [place]  (02/22/2022)   Overall Financial Resource Strain (CARDIA)    Difficulty of Paying Living Expenses: Not hard at all  Food Insecurity: No Food Insecurity (03/20/2022)   Hunger Vital Sign    Worried About Running Out of Food in the Last Year: Never true    [place] in the Last Year: Never true  Transportation Needs: No Transportation Needs (03/20/2022)   PRAPARE - Hydrologist (Medical): No    Lack of Transportation (Non-Medical): No  Physical Activity: Sufficiently Active (02/22/2022)   Exercise Vital Sign    Days of Exercise per Week: 5 days    Minutes of Exercise per Session: 30 min  Stress: No Stress Concern Present (02/22/2022)   [place]    Feeling of Stress : Not at all  Social Connections: Unknown (02/22/2022)   Social Connection and Isolation Panel [NHANES]    Frequency of Communication with Friends and Family: More than three times a week    Frequency of Social Gatherings with Friends and Family: More than three times a week    Attends Religious Services: More than 4 times per year    Active Member of [employer] or Organizations: Yes    Attends Archivist Meetings: More than 4 times per year    Marital Status: Not on file  Intimate Partner Violence: Not At Risk (03/20/2022)   Humiliation, Afraid, Rape, and Kick questionnaire    Fear of Current or Ex-Partner: No    Emotionally Abused: No    Physically  Abused: No    Sexually Abused: No    Family History  Problem Relation Age of Onset   Heart failure Mother    Hypertension Mother    Asthma Mother    Osteoporosis Mother    Heart attack Father 30       MI   Hypertension Sister    Prostate cancer Neg Hx    Bladder Cancer  Neg Hx    Kidney cancer Neg Hx    Colon cancer Neg Hx      Current Outpatient Medications:    apixaban (ELIQUIS) 5 MG TABS tablet, Take 1 tablet (5 mg total) by mouth 2 (two) times daily., Disp: 60 tablet, Rfl: 3   atorvastatin (LIPITOR) 40 MG tablet, TAKE 1 TABLET BY MOUTH DAILY, Disp: 90 tablet, Rfl: 3   calcium carbonate (OS-CAL - DOSED IN MG OF ELEMENTAL CALCIUM) 1250 (500 Ca) MG tablet, Take 1 tablet by mouth daily with lunch., Disp: , Rfl:    donepezil (ARICEPT) 10 MG tablet, Take 1 tablet by mouth at bedtime., Disp: , Rfl:    feeding supplement (ENSURE ENLIVE / ENSURE PLUS) LIQD, Take 237 mLs by mouth 2 (two) times daily between meals., Disp: 237 mL, Rfl: 12   fenofibrate 160 MG tablet, Take 1 tablet (160 mg total) by mouth daily., Disp: 30 tablet, Rfl: 3   ferrous sulfate 325 (65 FE) MG tablet, Take 325 mg by mouth daily with breakfast., Disp: , Rfl:    fexofenadine (ALLEGRA) 30 MG/5ML suspension, Take 30 mg by mouth daily., Disp: , Rfl:    FLUoxetine (PROZAC) 20 MG capsule, TAKE 1 CAPSULE BY MOUTH ONCE DAILY, Disp: 90 capsule, Rfl: 1   furosemide (LASIX) 40 MG tablet, Take 20 mg by mouth 2 (two) times daily., Disp: , Rfl:    gabapentin (NEURONTIN) 100 MG capsule, Take 1 capsule (100 mg total) by mouth 3 (three) times daily., Disp: 90 capsule, Rfl: 0   HYDROcodone-acetaminophen (NORCO) 10-325 MG tablet, Take 1 tablet by mouth 4 (four) times daily as needed for moderate pain., Disp: , Rfl:    hydrocortisone cream 1 %, Apply 1 application topically daily as needed for itching., Disp: , Rfl:    ipratropium (ATROVENT) 0.06 % nasal spray, Place 2 sprays into both nostrils 4 (four) times daily., Disp: 15 mL, Rfl:  12   lisinopril (ZESTRIL) 20 MG tablet, Take by mouth., Disp: , Rfl:    loperamide (IMODIUM) 2 MG capsule, Take 1 capsule (2 mg total) by mouth as needed for diarrhea or loose stools., Disp: 30 capsule, Rfl: 0   melatonin 5 MG TABS, Take 1 tablet (5 mg total) by mouth at bedtime as needed., Disp: , Rfl: 0   Multiple Vitamins-Minerals (PRESERVISION AREDS 2) CAPS, Take 1 capsule by mouth daily., Disp: , Rfl:    omeprazole (PRILOSEC) 40 MG capsule, TAKE 1 CAPSULE EVERY DAY, Disp: 90 capsule, Rfl: 3   vitamin B-12 (CYANOCOBALAMIN) 1000 MCG tablet, Take 1,000 mcg by mouth daily with lunch. , Disp: , Rfl:    Vitamin D, Ergocalciferol, (DRISDOL) 1.25 MG (50000 UNIT) CAPS capsule, Take 1 capsule by mouth on Monday and Thursday., Disp: 16 capsule, Rfl: 1   loratadine (CLARITIN) 10 MG tablet, Take 10 mg by mouth daily. (Patient not taking: Reported on 06/11/2022), Disp: , Rfl:    potassium chloride (KLOR-CON M) 10 MEQ tablet, Take 1 tablet (10 mEq total) by mouth daily. (Patient not taking: Reported on 06/11/2022), Disp: 30 tablet, Rfl: 3   vitamin E 180 MG (400 UNITS) capsule, Take 800 Units by mouth daily with lunch. (Patient not taking: Reported on 06/11/2022), Disp: , Rfl:  No current facility-administered medications for this visit.  Facility-Administered Medications Ordered in Other Visits:    0.9 %  sodium chloride infusion, , Intravenous, Continuous, Corcoran, Melissa C, MD, Last Rate: 10 mL/hr at 12/11/19 1123, New Bag at 12/11/19 1123  Physical exam:  Vitals:   06/11/22 1434  BP: 135/68  Pulse: 74  Resp: 17  Temp: 98.7 F (37.1 C)  SpO2: 100%  Weight: 145 lb (65.8 kg)   Physical Exam Cardiovascular:     Rate and Rhythm: Normal rate and regular rhythm.     Heart sounds: Normal heart sounds.  Pulmonary:     Effort: Pulmonary effort is normal.     Breath sounds: Normal breath sounds.  Abdominal:     General: Bowel sounds are normal.     Palpations: Abdomen is soft.  Musculoskeletal:      Comments: Chronic joint deformities noted in bilateral hands  Lymphadenopathy:     Comments: No palpable cervical, supraclavicular, axillary or inguinal adenopathy    Skin:    General: Skin is warm and dry.  Neurological:     Mental Status: He is alert and oriented to person, place, and time.         Latest Ref Rng & Units 06/11/2022    3:35 PM  CMP  Glucose 70 - 99 mg/dL 116   BUN 8 - 23 mg/dL 26   Creatinine 0.61 - 1.24 mg/dL 1.23   Sodium 135 - 145 mmol/L 137   Potassium 3.5 - 5.1 mmol/L 4.1   Chloride 98 - 111 mmol/L 102   CO2 22 - 32 mmol/L 26   Calcium 8.9 - 10.3 mg/dL 8.6   Total Protein 6.5 - 8.1 g/dL 7.5   Total Bilirubin 0.3 - 1.2 mg/dL 0.6   Alkaline Phos 38 - 126 U/L 67   AST 15 - 41 U/L 29   ALT 0 - 44 U/L 15       Latest Ref Rng & Units 06/11/2022    3:35 PM  CBC  WBC 4.0 - 10.5 K/uL 4.6   Hemoglobin 13.0 - 17.0 g/dL 9.4   Hematocrit 39.0 - 52.0 % 29.7   Platelets 150 - 400 K/uL 178     No images are attached to the encounter.  No results found.   Assessment and plan- Patient is a 82 y.o. male here to reestablish follow-up for normocytic anemia  Patient has had a complete workup for anemia about 3 years ago which was consistent with anemia of chronic disease and his hemoglobin used to be close to 10.  Since May of last year his hemoglobin has fluctuated between 7-8.  He underwent knee surgery in May 2023.  He also had bowel obstruction requiring resection of his colon last year as well.  His hemoglobin dropped down to 7.9 in December 2023.  Clinically patient does not have any palpable adenopathy.  I will plan to repeat his anemia workup including ferritin and iron studies B12 folate, myeloma workup as well as hemolysis workup today.  If peripheral workup is unrevealing and hemoglobin does not improve to his baseline of 10 I will consider repeating his bone marrow biopsy.  Telephone visit with me in 1 week's time to discuss the results of blood work    Visit Diagnosis 1. Normocytic anemia      Dr. Randa Evens, MD, MPH [facility] [phone] 06/12/2022 11:14 AM

## 2022-06-14 LAB — KAPPA/LAMBDA LIGHT CHAINS
Kappa free light chain: 63.5 mg/L — ABNORMAL HIGH (ref 3.3–19.4)
Kappa, lambda light chain ratio: 1.8 — ABNORMAL HIGH (ref 0.26–1.65)
Lambda free light chains: 35.2 mg/L — ABNORMAL HIGH (ref 5.7–26.3)

## 2022-06-15 LAB — MULTIPLE MYELOMA PANEL, SERUM
Albumin SerPl Elph-Mcnc: 3.8 g/dL (ref 2.9–4.4)
Albumin/Glob SerPl: 1.3 (ref 0.7–1.7)
Alpha 1: 0.2 g/dL (ref 0.0–0.4)
Alpha2 Glob SerPl Elph-Mcnc: 0.7 g/dL (ref 0.4–1.0)
B-Globulin SerPl Elph-Mcnc: 0.9 g/dL (ref 0.7–1.3)
Gamma Glob SerPl Elph-Mcnc: 1.2 g/dL (ref 0.4–1.8)
Globulin, Total: 3 g/dL (ref 2.2–3.9)
IgA: 367 mg/dL (ref 61–437)
IgG (Immunoglobin G), Serum: 1079 mg/dL (ref 603–1613)
IgM (Immunoglobulin M), Srm: 141 mg/dL (ref 15–143)
Total Protein ELP: 6.8 g/dL (ref 6.0–8.5)

## 2022-06-17 ENCOUNTER — Inpatient Hospital Stay (HOSPITAL_BASED_OUTPATIENT_CLINIC_OR_DEPARTMENT_OTHER): Payer: Medicare Other | Admitting: Oncology

## 2022-06-17 DIAGNOSIS — D649 Anemia, unspecified: Secondary | ICD-10-CM | POA: Diagnosis not present

## 2022-06-19 ENCOUNTER — Encounter: Payer: Self-pay | Admitting: Oncology

## 2022-06-19 ENCOUNTER — Encounter: Payer: Self-pay | Admitting: Hematology and Oncology

## 2022-06-19 NOTE — Progress Notes (Signed)
I connected with Bryan [name] on 06/19/22 at 11:15 AM EST by telephone visit and verified that I am speaking with the correct person using two identifiers.   I discussed the limitations, risks, security and privacy concerns of performing an evaluation and management service by telemedicine and the availability of in-person appointments. I also discussed with the patient that there may be a patient responsible charge related to this service. The patient expressed understanding and agreed to proceed.  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 Hemoglobin at baseline runs around 10. He is also had a bone marrow biopsy back in 2018 which revealed normocellular marrow with trilineage hematopoiesis and maturation with normal cytogenetics and FISH. He has had history of iron deficiency anemia in the past and has required IV iron.   Interval history cute issues since last visit.  Patient has chronic fatigue and joint pain from rheumatoid arthritis   Review of Systems  Constitutional:  Positive for malaise/fatigue. Negative for chills, fever and weight loss.  HENT:  Negative for congestion, ear discharge and nosebleeds.   Eyes:  Negative for blurred vision.  Respiratory:  Negative for cough, hemoptysis, sputum production, shortness of breath and wheezing.   Cardiovascular:  Negative for chest pain, palpitations, orthopnea and claudication.  Gastrointestinal:  Negative for abdominal pain, blood in stool, constipation, diarrhea, heartburn, melena, nausea and vomiting.  Genitourinary:  Negative for dysuria, flank pain, frequency, hematuria and urgency.  Musculoskeletal:  Positive for joint pain. Negative for  back pain and myalgias.  Skin:  Negative for rash.  Neurological:  Negative for dizziness, tingling, focal weakness, seizures, weakness and headaches.  Endo/Heme/Allergies:  Does not bruise/bleed easily.  Psychiatric/Behavioral:  Negative for depression and suicidal ideas. The patient does not have insomnia.     Allergies  Allergen Reactions   Penicillins Hives, Swelling and Other (See Comments)    SWELLING REACTION UNSPECIFIED PATIENT HAS TAKEN AMOXICILLIN ON MED HX FROM [facility] PATIENT HAS HAD A PCN REACTION WITH IMMEDIATE RASH, FACIAL/TONGUE/THROAT SWELLING, SOB, OR LIGHTHEADEDNESS WITH HYPOTENSION:  #  #  #  YES  #  #  #   Has patient had a PCN reaction causing severe rash involving mucus membranes or skin necrosis: No Has patient had a PCN reaction that required hospitalization No Has patient had a PCN reaction occurring within the last 10 years: No   Demerol [Meperidine] Hives and Nausea And Vomiting   Other     Past Medical History:  Diagnosis Date   Abnormal CT scan    Asymmetric left rectal wall thickening    Anemia    Anxiety    Basal cell carcinoma 04/28/2020   L forehead - ED&C 06/17/2020   Basal cell carcinoma 05/06/2021   left scalp postauricular ant, EDC   Basal cell carcinoma 05/06/2021   left scalp postauricular post, [facility]C   Chronic heart failure with preserved ejection fraction (HFpEF) ([place])    a. 02/2022 Echo: EF 60-65%; b. 04/2022 Echo: EF >55%, nl prosth valve fx, sev dil LA, nl RV fxn, mod dil RA.   Chronic lower back pain    Complication of anesthesia    Memory loss 09/2015   Coronary artery disease    a.  2010 Cath: mild-mod CAD per notes.   Depression    GERD (gastroesophageal reflux disease)    Glaucoma    H/O thoracic aortic aneurysm repair    Headache    Heart murmur    History of being hospitalized    memory lose kidney funtion down blood pressure up   History of blood transfusion    "think he had one when he had heart valve OR" (10/14/2016);  "none since" (10/19/2017)   History of chicken pox    Hyperlipidemia    Hypertension    Lumbar stenosis    Murmur    Neuropathy    Osteoarthritis    worse in feet and ankles   Permanent atrial fibrillation ([place])    a. CHA2DS2VASc = 5 -->eliquis   Poor short term memory    takes Aricept   Rheumatoid arthritis ([place])    "all over" (10/14/2016)   Schizophrenia ([place])    Seasonal allergies    Thoracic spine pain    Valvular heart disease    Wears glasses    reading    Past Surgical History:  Procedure Laterality Date   AORTIC VALVE REPLACEMENT  2007   [facility].  Supply, [place]; "pig valve"   APPENDECTOMY  2010   BACK SURGERY     CARDIAC VALVE REPLACEMENT     CATARACT EXTRACTION W/ INTRAOCULAR LENS  IMPLANT, BILATERAL Bilateral 2012   COLONOSCOPY WITH PROPOFOL N/A 09/24/2016   Procedure: COLONOSCOPY WITH PROPOFOL;  Surgeon: Jonathon Bellows, MD;  Location: [facility] ENDOSCOPY;  Service: Endoscopy;  Laterality: N/A;   ESOPHAGOGASTRODUODENOSCOPY (EGD) WITH PROPOFOL N/A 09/24/2016   Procedure: ESOPHAGOGASTRODUODENOSCOPY (EGD) WITH PROPOFOL;  Surgeon: Jonathon Bellows, MD;  Location: [facility] ENDOSCOPY;  Service: Endoscopy;  Laterality: N/A;   GIVENS CAPSULE STUDY N/A 09/24/2016   Procedure: GIVENS CAPSULE STUDY;  Surgeon: Jonathon Bellows, MD;  Location: [facility] ENDOSCOPY;  Service: Endoscopy;  Laterality: N/A;   HAMMER TOE SURGERY Right 10/09/2015   Procedure: HAMMER TOE REPAIR WITH K-WIRE FIXATION RIGHT SECOND TOE;  Surgeon: Albertine Patricia, DPM;  Location: [place];  Service: Podiatry;  Laterality: Right;  WITH LOCAL   HARDWARE REMOVAL N/A 11/02/2019   Procedure: Removal of hardware thoracic spine;  Surgeon: Kary Kos, MD;  Location: [place];  Service: Neurosurgery;  Laterality: N/A;  posterior   INGUINAL HERNIA REPAIR Right 05/05/2018   Medium Bard PerFix plug.  Surgeon: Robert Bellow, MD;  Location: [facility] ORS;  Service: General;  Laterality: Right;   IR VERTEBROPLASTY CERV/THOR BX INC  UNI/BIL INC/INJECT/IMAGING  10/27/2018   JOINT REPLACEMENT Left    left total knee   LAMINECTOMY WITH POSTERIOR LATERAL ARTHRODESIS LEVEL 3 N/A 09/28/2017   Procedure: LUMBAR  POSTEROR  FUSION REVISION - LUMBAR ONE -TWO, LUMBAR TWO -THREE,THREE-FOUR ,BILATERAL ARTHRODESIS REMOVAL LUMBAR THREE HARDWARE;  Surgeon: Kary Kos, MD;  Location: [place];  Service: Neurosurgery;  Laterality: N/A;   LAMINECTOMY WITH POSTERIOR LATERAL ARTHRODESIS LEVEL 3 N/A 10/20/2017   Procedure: Revision fusion and removal of hardware Lumbar one, Pedicle screw fixation thoracic ten-lumbar two, thoracic nine-ten laminotomy, Posterior Lumbar Arthrodesis thoracic ten-lumbar two ;  Surgeon: Kary Kos, MD;  Location: [place];  Service: Neurosurgery;  Laterality: N/A;   LAPAROSCOPIC CHOLECYSTECTOMY  2010   LUMBAR FUSION Right 06/08/2017   LUMBAR THREE-FOUR, LUMBAR FOUR-FIVE POSTEROLATERAL ARTHRODESIS WITH RIGHT LUMBAR FOUR-FIVE LAMINECTOMY/FORAMINOTOMY   LUMBAR LAMINECTOMY/DECOMPRESSION MICRODISCECTOMY Right 03/08/2016   Procedure: Laminectomy and Foraminotomy - Lumbar Five-Sacral One Right;  Surgeon: Kary Kos, MD;  Location[facility]  OR;  Service: Neurosurgery;  Laterality: Right;  Right   MULTIPLE TOOTH EXTRACTIONS     "3"   OTOPLASATY Right 12/15/2021   Procedure: Right auricle repair;  Surgeon: Margaretha Sheffield, MD;  Location: [facility] ORS;  Service: ENT;  Laterality: Right;   POSTERIOR LUMBAR FUSION  10/14/2016   L5-S1   REPLACEMENT TOTAL KNEE Left 2003   THORACIC AORTIC ANEURYSM REPAIR  2010   TOTAL KNEE ARTHROPLASTY Right 08/2021    Social History   Socioeconomic History   Marital status: Widowed    Spouse name: Not on file   Number of children: Not on file   Years of education: Not on file   Highest education level: Not on file  Occupational History   Occupation: retired  Tobacco Use   Smoking status: Former    Packs/day: 1.00    Years: 32.00    Total pack years: 32.00    Types: Cigarettes    Quit date: 07/05/1981     Years since quitting: 40.9   Smokeless tobacco: Never  Vaping Use   Vaping Use: Never used  Substance and Sexual Activity   Alcohol use: Not Currently    Comment: "glass of wine/month; if that"   Drug use: Never   Sexual activity: Not Currently  Other Topics Concern   Not on file  Social History Narrative   Not on file   Social Determinants of Health   Financial Resource Strain: [place]  (02/22/2022)   Overall Financial Resource Strain (CARDIA)    Difficulty of Paying Living Expenses: Not hard at all  Food Insecurity: No Food Insecurity (03/20/2022)   Hunger Vital Sign    Worried About Running Out of Food in the Last Year: Never true    [place] in the Last Year: Never true  Transportation Needs: No Transportation Needs (03/20/2022)   PRAPARE - Hydrologist (Medical): No    Lack of Transportation (Non-Medical): No  Physical Activity: Sufficiently Active (02/22/2022)   Exercise Vital Sign    Days of Exercise per Week: 5 days    Minutes of Exercise per Session: 30 min  Stress: No Stress Concern Present (02/22/2022)   [place]    Feeling of Stress : Not at all  Social Connections: Unknown (02/22/2022)   Social Connection and Isolation Panel [NHANES]    Frequency of Communication with Friends and Family: More than three times a week    Frequency of Social Gatherings with Friends and Family: More than three times a week    Attends Religious Services: More than 4 times per year    Active Member of [employer] or Organizations: Yes    Attends Archivist Meetings: More than 4 times per year    Marital Status: Not on file  Intimate Partner Violence: Not At Risk (03/20/2022)   Humiliation, Afraid, Rape, and Kick questionnaire    Fear of Current or Ex-Partner: No    Emotionally Abused: No    Physically Abused: No    Sexually Abused: No    Family History  Problem Relation  Age of Onset   Heart failure Mother    Hypertension Mother    Asthma Mother    Osteoporosis Mother    Heart attack Father 59       MI   Hypertension Sister    Prostate cancer Neg Hx    Bladder Cancer Neg Hx    Kidney cancer  Neg Hx    Colon cancer Neg Hx      Current Outpatient Medications:    apixaban (ELIQUIS) 5 MG TABS tablet, Take 1 tablet (5 mg total) by mouth 2 (two) times daily., Disp: 60 tablet, Rfl: 3   atorvastatin (LIPITOR) 40 MG tablet, TAKE 1 TABLET BY MOUTH DAILY, Disp: 90 tablet, Rfl: 3   calcium carbonate (OS-CAL - DOSED IN MG OF ELEMENTAL CALCIUM) 1250 (500 Ca) MG tablet, Take 1 tablet by mouth daily with lunch., Disp: , Rfl:    donepezil (ARICEPT) 10 MG tablet, Take 1 tablet by mouth at bedtime., Disp: , Rfl:    feeding supplement (ENSURE ENLIVE / ENSURE PLUS) LIQD, Take 237 mLs by mouth 2 (two) times daily between meals., Disp: 237 mL, Rfl: 12   fenofibrate 160 MG tablet, Take 1 tablet (160 mg total) by mouth daily., Disp: 30 tablet, Rfl: 3   ferrous sulfate 325 (65 FE) MG tablet, Take 325 mg by mouth daily with breakfast., Disp: , Rfl:    fexofenadine (ALLEGRA) 30 MG/5ML suspension, Take 30 mg by mouth daily., Disp: , Rfl:    FLUoxetine (PROZAC) 20 MG capsule, TAKE 1 CAPSULE BY MOUTH ONCE DAILY, Disp: 90 capsule, Rfl: 1   furosemide (LASIX) 40 MG tablet, Take 20 mg by mouth 2 (two) times daily., Disp: , Rfl:    gabapentin (NEURONTIN) 100 MG capsule, Take 1 capsule (100 mg total) by mouth 3 (three) times daily., Disp: 90 capsule, Rfl: 0   HYDROcodone-acetaminophen (NORCO) 10-325 MG tablet, Take 1 tablet by mouth 4 (four) times daily as needed for moderate pain., Disp: , Rfl:    hydrocortisone cream 1 %, Apply 1 application topically daily as needed for itching., Disp: , Rfl:    ipratropium (ATROVENT) 0.06 % nasal spray, Place 2 sprays into both nostrils 4 (four) times daily., Disp: 15 mL, Rfl: 12   lisinopril (ZESTRIL) 20 MG tablet, Take by mouth., Disp: , Rfl:     loperamide (IMODIUM) 2 MG capsule, Take 1 capsule (2 mg total) by mouth as needed for diarrhea or loose stools., Disp: 30 capsule, Rfl: 0   loratadine (CLARITIN) 10 MG tablet, Take 10 mg by mouth daily. (Patient not taking: Reported on 06/11/2022), Disp: , Rfl:    melatonin 5 MG TABS, Take 1 tablet (5 mg total) by mouth at bedtime as needed., Disp: , Rfl: 0   Multiple Vitamins-Minerals (PRESERVISION AREDS 2) CAPS, Take 1 capsule by mouth daily., Disp: , Rfl:    omeprazole (PRILOSEC) 40 MG capsule, TAKE 1 CAPSULE EVERY DAY, Disp: 90 capsule, Rfl: 3   potassium chloride (KLOR-CON M) 10 MEQ tablet, Take 1 tablet (10 mEq total) by mouth daily. (Patient not taking: Reported on 06/11/2022), Disp: 30 tablet, Rfl: 3   vitamin B-12 (CYANOCOBALAMIN) 1000 MCG tablet, Take 1,000 mcg by mouth daily with lunch. , Disp: , Rfl:    Vitamin D, Ergocalciferol, (DRISDOL) 1.25 MG (50000 UNIT) CAPS capsule, Take 1 capsule by mouth on Monday and Thursday., Disp: 16 capsule, Rfl: 1   vitamin E 180 MG (400 UNITS) capsule, Take 800 Units by mouth daily with lunch. (Patient not taking: Reported on 06/11/2022), Disp: , Rfl:  No current facility-administered medications for this visit.  Facility-Administered Medications Ordered in Other Visits:    0.9 %  sodium chloride infusion, , Intravenous, Continuous, Corcoran, Melissa C, MD, Last Rate: 10 mL/hr at 12/11/19 1123, New Bag at 12/11/19 1123  No results found.  No images are attached to the  encounter.      Latest Ref Rng & Units 06/11/2022    3:35 PM  CMP  Glucose 70 - 99 mg/dL 116   BUN 8 - 23 mg/dL 26   Creatinine 0.61 - 1.24 mg/dL 1.23   Sodium 135 - 145 mmol/L 137   Potassium 3.5 - 5.1 mmol/L 4.1   Chloride 98 - 111 mmol/L 102   CO2 22 - 32 mmol/L 26   Calcium 8.9 - 10.3 mg/dL 8.6   Total Protein 6.5 - 8.1 g/dL 7.5   Total Bilirubin 0.3 - 1.2 mg/dL 0.6   Alkaline Phos 38 - 126 U/L 67   AST 15 - 41 U/L 29   ALT 0 - 44 U/L 15       Latest Ref Rng & Units  06/11/2022    3:35 PM  CBC  WBC 4.0 - 10.5 K/uL 4.6   Hemoglobin 13.0 - 17.0 g/dL 9.4   Hematocrit 39.0 - 52.0 % 29.7   Platelets 150 - 400 K/uL 178    Assessment and plan: Patient is a 82 year old male with history of chronic normocytic anemia likely secondary to chronic disease as well as iron deficiency.  This is a visit to discuss the results of blood work    Patient has baseline anemia of chronic disease and his hemoglobin has been mostly around 10 and has remained in that range since February 2022.  He was noted to have a decline in his hemoglobin in December 2023 when it dropped between 7.5-8.5.  Presently his hemoglobin has improved back to 9.4.  White count and platelets are normal.  B12 levels are normal ferritin levels normal at 125 haptoglobin is normal TSH is normal myeloma panel showed no M protein.  He has mild chronic elevation of LDH. Serum free light chain ratio mildly abnormal at 1.8 but does not explain his anemia.  Given that his hemoglobin is improved to 9.4 which is close to his baseline I am holding off on bone marrow biopsy at this time.  If his heme globin again drops down between 7-8 I will pursue a bone marrow biopsy at that time.  Follow-up instructions: CBC with differential in 1 month and 2 months and I will see him back in 2 months  I discussed the assessment and treatment plan with the patient. The patient was provided an opportunity to ask questions and all were answered. The patient agreed with the plan and demonstrated an understanding of the instructions.   The patient was advised to call back or seek an in-person evaluation if the symptoms worsen or if the condition fails to improve as anticipated.  I provided 12 minutes of non face-to-face telephone visit time during this encounter, and > 50% was spent counseling as documented under my assessment & plan.  Visit Diagnosis: 1. Normocytic anemia     Dr. Randa Evens, MD, MPH [facility] at [facility] Tel- [phone] 06/19/2022 12:36 PM

## 2022-07-16 ENCOUNTER — Telehealth: Payer: Self-pay | Admitting: Cardiovascular Disease

## 2022-07-16 ENCOUNTER — Telehealth: Payer: Self-pay | Admitting: Cardiology

## 2022-07-16 MED ORDER — POTASSIUM CHLORIDE CRYS ER 10 MEQ PO TBCR: 10.0000 meq | EXTENDED_RELEASE_TABLET | Freq: Every day | ORAL | 3 refills | Status: DC

## 2022-07-16 NOTE — Telephone Encounter (Signed)
Pt c/o BP issue: STAT if pt c/o blurred vision, one-sided weakness or slurred speech  1. What are your last 5 BP readings? 180/90 pulse  95, he says it been running high for the last week, but did not have the readings- he said the lowest its been 165/85  2. Are you having any other symptoms (ex. Dizziness, headache, blurred vision, passed out)? Short of breath a little this morning, it was worse last night, he said his pulse have been running high also  3. What is your BP issue? High blood pressure, high pulse rate and shortness of breath- patient says he wants to be seen

## 2022-07-16 NOTE — Telephone Encounter (Signed)
Patient called the answering service this evening concerned that his blood pressure was elevated yesterday evening. He told me that he had called our office this AM, but did not get a phone call back.  Reports that his BP was elevated to 190/105 earlier today.  When last checked, his blood pressure was 165/90.  Patient also complained of shortness of breath that has been going on for the past 2 to 3 days.  Shortness of breath is more significant when laying flat on his back or on exertion.  Patient recently stopped taking his Lasix because it was making him go to the bathroom too much.  Denies weight gain, ankle edema. Given shortness of breath and orthopnea, I instructed patient to resume taking Lasix 20 mg twice daily over the weekend (Saturday, Sunday).  I also sent patient a prescription for potassium chloride 10 mEq to take on days that he takes lasix. I informed patient that if he has vision changes, weakness, headache, chest pain, worsening shortness of breath he should go to an emergency department for evaluation.  I also told patient that I would contact our office to get him an appointment early next week.  He will need a BMP at that appointment. Creatinine was 1.23 on 06/11/22. K was 4.1.   Margie Billet, PA-C 07/16/2022 6:48 PM

## 2022-07-19 ENCOUNTER — Inpatient Hospital Stay: Payer: Medicare Other | Attending: Oncology

## 2022-07-19 DIAGNOSIS — D649 Anemia, unspecified: Secondary | ICD-10-CM | POA: Insufficient documentation

## 2022-07-19 LAB — CBC WITH DIFFERENTIAL/PLATELET
Abs Immature Granulocytes: 0.01 10*3/uL (ref 0.00–0.07)
Basophils Absolute: 0 10*3/uL (ref 0.0–0.1)
Basophils Relative: 1 %
Eosinophils Absolute: 0.1 10*3/uL (ref 0.0–0.5)
Eosinophils Relative: 2 %
HCT: 30.7 % — ABNORMAL LOW (ref 39.0–52.0)
Hemoglobin: 9.4 g/dL — ABNORMAL LOW (ref 13.0–17.0)
Immature Granulocytes: 0 %
Lymphocytes Relative: 7 %
Lymphs Abs: 0.3 10*3/uL — ABNORMAL LOW (ref 0.7–4.0)
MCH: 30.5 pg (ref 26.0–34.0)
MCHC: 30.6 g/dL (ref 30.0–36.0)
MCV: 99.7 fL (ref 80.0–100.0)
Monocytes Absolute: 0.3 10*3/uL (ref 0.1–1.0)
Monocytes Relative: 8 %
Neutro Abs: 3 10*3/uL (ref 1.7–7.7)
Neutrophils Relative %: 82 %
Platelets: 117 10*3/uL — ABNORMAL LOW (ref 150–400)
RBC: 3.08 MIL/uL — ABNORMAL LOW (ref 4.22–5.81)
RDW: 16.5 % — ABNORMAL HIGH (ref 11.5–15.5)
WBC: 3.7 10*3/uL — ABNORMAL LOW (ref 4.0–10.5)
nRBC: 0 % (ref 0.0–0.2)

## 2022-07-23 NOTE — Telephone Encounter (Signed)
Pt stated his blood his doing much better with restarting lasix.   Minna Merritts, MD  C[facility] [place] Triage6 days ago    Can we follow-up on his call to find out if blood pressure has improved by restarting Lasix Thx TGollan

## 2022-07-29 ENCOUNTER — Other Ambulatory Visit: Payer: Self-pay | Admitting: *Deleted

## 2022-07-29 ENCOUNTER — Telehealth: Payer: Self-pay | Admitting: Family Medicine

## 2022-07-29 MED ORDER — FUROSEMIDE 40 MG PO TABS: 20.0000 mg | ORAL_TABLET | Freq: Two times a day (BID) | ORAL | 0 refills | Status: DC

## 2022-07-29 NOTE — Telephone Encounter (Signed)
Prescription Request  07/29/2022  LOV: 04/30/2022  What is the name of the medication or equipment? furosemide (LASIX) 40 MG tablet  Have you contacted your pharmacy to request a refill? Yes   Which pharmacy would you like this sent to?   [facility] - [place], [place] - [address] [address] Phone: [phone] Fax: [phone]      Patient notified that their request is being sent to the clinical staff for review and that they should receive a response within 2 business days.   Please advise at Mobile There is no such number on file (mobile).

## 2022-08-02 ENCOUNTER — Other Ambulatory Visit: Payer: Self-pay

## 2022-08-02 MED ORDER — FUROSEMIDE 40 MG PO TABS: 20.0000 mg | ORAL_TABLET | Freq: Two times a day (BID) | ORAL | 0 refills | Status: DC

## 2022-08-02 NOTE — Telephone Encounter (Signed)
sent 

## 2022-08-11 ENCOUNTER — Encounter: Payer: Self-pay | Admitting: Nurse Practitioner

## 2022-08-11 ENCOUNTER — Ambulatory Visit: Payer: Medicare Other | Attending: Nurse Practitioner | Admitting: Nurse Practitioner

## 2022-08-11 VITALS — BP 138/76 | HR 89 | Ht 71.0 in | Wt 160.4 lb

## 2022-08-11 DIAGNOSIS — I2510 Atherosclerotic heart disease of native coronary artery without angina pectoris: Secondary | ICD-10-CM

## 2022-08-11 DIAGNOSIS — I10 Essential (primary) hypertension: Secondary | ICD-10-CM

## 2022-08-11 DIAGNOSIS — I4821 Permanent atrial fibrillation: Secondary | ICD-10-CM

## 2022-08-11 DIAGNOSIS — I5033 Acute on chronic diastolic (congestive) heart failure: Secondary | ICD-10-CM

## 2022-08-11 DIAGNOSIS — N182 Chronic kidney disease, stage 2 (mild): Secondary | ICD-10-CM

## 2022-08-11 DIAGNOSIS — E782 Mixed hyperlipidemia: Secondary | ICD-10-CM

## 2022-08-11 DIAGNOSIS — Z952 Presence of prosthetic heart valve: Secondary | ICD-10-CM | POA: Diagnosis not present

## 2022-08-11 MED ORDER — TORSEMIDE 40 MG PO TABS: 40.0000 mg | ORAL_TABLET | Freq: Two times a day (BID) | ORAL | 1 refills | Status: DC

## 2022-08-11 NOTE — Patient Instructions (Signed)
Medication Instructions:  STOP the Furosemide  START Torsemide 40 mg twice daily  *If you need a refill on your cardiac medications before your next appointment, please call your pharmacy*   Lab Work: [facility] to be [facility] at [facility]  If you have labs (blood work) drawn today and your tests are completely normal, you will receive your results only by: [place] (if you have MyChart) OR A paper copy in the mail If you have any lab test that is abnormal or we need to change your treatment, we will call you to review the results.

## 2022-08-11 NOTE — Progress Notes (Signed)
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  Past Medical History    Past Medical History:  Diagnosis Date   Abnormal CT scan    Asymmetric left rectal wall thickening    Anemia    Anxiety    Basal cell carcinoma 04/28/2020   L forehead - ED&C 06/17/2020   Basal cell carcinoma 05/06/2021   left scalp postauricular ant, EDC   Basal cell carcinoma 05/06/2021   left scalp postauricular post, EDC   Chronic heart failure with preserved ejection fraction (HFpEF) ([place])    a. 02/2022 Echo: EF 60-65%; b. 04/2022 Echo: EF >55%, nl prosth valve fx, sev dil LA, nl RV fxn, mod dil RA.   Chronic lower back pain    Complication of anesthesia    Memory loss 09/2015   Coronary artery disease    a. 2010 Cath: mild-mod CAD per notes.   Depression    GERD (gastroesophageal reflux disease)    Glaucoma    H/O thoracic aortic aneurysm repair    Headache    Heart murmur    History of being hospitalized    memory lose kidney funtion down blood pressure up   History of blood transfusion    "think he had one when he had heart valve OR" (10/14/2016); "none since" (10/19/2017)   History of chicken pox    Hyperlipidemia    Hypertension    Lumbar stenosis    Murmur    Neuropathy    Osteoarthritis    worse in feet and ankles   Permanent atrial fibrillation ([place])    a. CHA2DS2VASc = 5 -->eliquis   Poor short term  memory    takes Aricept   Rheumatoid arthritis ([place])    "all over" (10/14/2016)   Schizophrenia ([place])    Seasonal allergies    Thoracic spine pain    Valvular heart disease    Wears glasses    reading   Past Surgical History:  Procedure Laterality Date   AORTIC VALVE REPLACEMENT  2007   [facility].  [place], [place]; "pig valve"   APPENDECTOMY  2010   BACK SURGERY     CARDIAC VALVE REPLACEMENT     CATARACT EXTRACTION W/ INTRAOCULAR LENS  IMPLANT, BILATERAL Bilateral 2012   COLONOSCOPY WITH PROPOFOL N/A 09/24/2016   Procedure: COLONOSCOPY WITH PROPOFOL;  Surgeon: Jonathon Bellows, MD;  Location: [facility] ENDOSCOPY;  Service: Endoscopy;  Laterality: N/A;   ESOPHAGOGASTRODUODENOSCOPY (EGD) WITH PROPOFOL N/A 09/24/2016   Procedure: ESOPHAGOGASTRODUODENOSCOPY (EGD) WITH PROPOFOL;  Surgeon: Jonathon Bellows, MD;  Location: [facility] ENDOSCOPY;  Service: Endoscopy;  Laterality: N/A;   GIVENS CAPSULE STUDY N/A 09/24/2016   Procedure: GIVENS CAPSULE STUDY;  Surgeon: Jonathon Bellows, MD;  Location: [facility] ENDOSCOPY;  Service: Endoscopy;  Laterality: N/A;   HAMMER TOE SURGERY Right 10/09/2015   Procedure: HAMMER TOE REPAIR WITH K-WIRE FIXATION RIGHT SECOND TOE;  Surgeon: Albertine Patricia, DPM;  Location: [place];  Service: Podiatry;  Laterality: Right;  WITH LOCAL   HARDWARE REMOVAL  N/A 11/02/2019   Procedure: Removal of hardware thoracic spine;  Surgeon: Kary Kos, MD;  Location: [place];  Service: Neurosurgery;  Laterality: N/A;  posterior   INGUINAL HERNIA REPAIR Right 05/05/2018   Medium Bard PerFix plug.  Surgeon: Robert Bellow, MD;  Location: [facility] ORS;  Service: General;  Laterality: Right;   IR VERTEBROPLASTY CERV/THOR BX INC UNI/BIL INC/INJECT/IMAGING  10/27/2018   JOINT REPLACEMENT Left    left total knee   LAMINECTOMY WITH POSTERIOR LATERAL ARTHRODESIS LEVEL 3 N/A 09/28/2017   Procedure: LUMBAR  POSTEROR  FUSION REVISION - LUMBAR ONE -TWO, LUMBAR TWO -THREE,THREE-FOUR ,BILATERAL ARTHRODESIS  REMOVAL LUMBAR THREE HARDWARE;  Surgeon: Kary Kos, MD;  Location: [place];  Service: Neurosurgery;  Laterality: N/A;   LAMINECTOMY WITH POSTERIOR LATERAL ARTHRODESIS LEVEL 3 N/A 10/20/2017   Procedure: Revision fusion and removal of hardware Lumbar one, Pedicle screw fixation thoracic ten-lumbar two, thoracic nine-ten laminotomy, Posterior Lumbar Arthrodesis thoracic ten-lumbar two ;  Surgeon: Kary Kos, MD;  Location: [place];  Service: Neurosurgery;  Laterality: N/A;   LAPAROSCOPIC CHOLECYSTECTOMY  2010   LUMBAR FUSION Right 06/08/2017   LUMBAR THREE-FOUR, LUMBAR FOUR-FIVE POSTEROLATERAL ARTHRODESIS WITH RIGHT LUMBAR FOUR-FIVE LAMINECTOMY/FORAMINOTOMY   LUMBAR LAMINECTOMY/DECOMPRESSION MICRODISCECTOMY Right 03/08/2016   Procedure: Laminectomy and Foraminotomy - Lumbar Five-Sacral One Right;  Surgeon: Kary Kos, MD;  Location: [place];  Service: Neurosurgery;  Laterality: Right;  Right   MULTIPLE TOOTH EXTRACTIONS     "3"   OTOPLASATY Right 12/15/2021   Procedure: Right auricle repair;  Surgeon: Margaretha Sheffield, MD;  Location: [facility] ORS;  Service: ENT;  Laterality: Right;   POSTERIOR LUMBAR FUSION  10/14/2016   L5-S1   REPLACEMENT TOTAL KNEE Left 2003   THORACIC AORTIC ANEURYSM REPAIR  2010   TOTAL KNEE ARTHROPLASTY Right 08/2021    Allergies  Allergies  Allergen Reactions   Penicillins Hives, Swelling and Other (See Comments)    SWELLING REACTION UNSPECIFIED PATIENT HAS TAKEN AMOXICILLIN ON MED HX FROM [facility] PATIENT HAS HAD A PCN REACTION WITH IMMEDIATE RASH, FACIAL/TONGUE/THROAT SWELLING, SOB, OR LIGHTHEADEDNESS WITH HYPOTENSION:  #  #  #  YES  #  #  #   Has patient had a PCN reaction causing severe rash involving mucus membranes or skin necrosis: No Has patient had a PCN reaction that required hospitalization No Has patient had a PCN reaction occurring within the last 10 years: No   Demerol [Meperidine] Hives and Nausea And Vomiting   Other     History of Present Illness     82 year old male with above past medical history including ascending aortic aneurysm and bicuspid aortic valve status post bioprosthetic AVR and ascending aortic grafting in 2010, hypertension, hyperlipidemia, permanent atrial fibrillation, stage III chronic kidney disease, centrilobular emphysema, iron deficiency anemia, and right hemicolectomy with ileal resection for hemorrhagic necrosis in November 2023.  During hospitalization November 2023, he developed atrial fibrillation with rapid ventricular response while in emergency department and was transferred to [facility].  There, he underwent exploratory laparotomy with mesh removal and small bowel resection with primary anastomosis.  He was subsequently discharged to skilled nursing facility but then required ED visit in early December 2023 secondary to progressive dyspnea and edema.  Weight was up and chest x-ray showed pulmonary edema and he was treated with IV Lasix.  Echo on May 03, 2022 showed normal LV function with normal functioning bioprosthetic aortic valve, severely dilated left atrium, moderately dilated right atrium, normal RV function.  He was treated for bilateral  lower extremity cellulitis and discharged home on Lasix and antibiotic therapy.  Mr. Boettcher was last seen in cardiology clinic in December 2023, at which time he was noted to have 2+ bilateral lower extremity edema to his knees with chronic, stable dyspnea on exertion.  Lasix was increased to 80 mg twice daily for 3 days without significant change in weight or edema on the higher dose.  Edema was felt at least partially due to be secondary to anemia and hypoalbuminemia.  He was referred to hematology, who repeated blood work and did not feel that bone marrow biopsy was indicated.  Earlier this year, patient contacted our office and reported that he would no longer take Lasix as it was causing to frequent urination.  Unfortunately, in late February, he contacted our office again to  report elevated blood pressures and was advised to resume Lasix.  He was seen by primary care last week and his weight was up to 168 pounds (dry weight of about 145 pounds in January 2024).  Lasix was increased to 80 mg twice daily for 5 days and at follow-up earlier this week, his weight was down to 161 pounds.  He is currently taking Lasix 40 mg twice daily.  He has chronic, stable dyspnea on exertion.  He has ongoing bilateral lower extremity woody edema to his hips, which is most noticeable to him in his posterior thighs and around his knees, which resulted in leg heaviness and reduced mobility.  He denies chest pain, palpitations, PND, orthopnea, dizziness, syncope, or early satiety.  Home Medications    Current Outpatient Medications  Medication Sig Dispense Refill   apixaban (ELIQUIS) 5 MG TABS tablet Take 1 tablet (5 mg total) by mouth 2 (two) times daily. 60 tablet 3   atorvastatin (LIPITOR) 40 MG tablet TAKE 1 TABLET BY MOUTH DAILY 90 tablet 3   calcium carbonate (OS-CAL - DOSED IN MG OF ELEMENTAL CALCIUM) 1250 (500 Ca) MG tablet Take 1 tablet by mouth daily with lunch.     donepezil (ARICEPT) 10 MG tablet Take 1 tablet by mouth at bedtime.     feeding supplement (ENSURE ENLIVE / ENSURE PLUS) LIQD Take 237 mLs by mouth 2 (two) times daily between meals. 237 mL 12   fenofibrate 160 MG tablet Take 1 tablet (160 mg total) by mouth daily. 30 tablet 3   ferrous sulfate 325 (65 FE) MG tablet Take 325 mg by mouth daily with breakfast.     FLUoxetine (PROZAC) 20 MG capsule TAKE 1 CAPSULE BY MOUTH ONCE DAILY 90 capsule 1   gabapentin (NEURONTIN) 100 MG capsule Take 1 capsule (100 mg total) by mouth 3 (three) times daily. 90 capsule 0   HYDROcodone-acetaminophen (HYCET) 7.5-325 mg/15 ml solution Take 1 tablet by mouth 4 (four) times daily as needed for moderate pain.     hydrocortisone cream 1 % Apply 1 application topically daily as needed for itching.     ipratropium (ATROVENT) 0.06 % nasal spray  Place 2 sprays into both nostrils 4 (four) times daily. 15 mL 12   lisinopril (ZESTRIL) 20 MG tablet Take 30 mg by mouth daily.     loperamide (IMODIUM) 2 MG capsule Take 1 capsule (2 mg total) by mouth as needed for diarrhea or loose stools. 30 capsule 0   loratadine (CLARITIN) 10 MG tablet Take 10 mg by mouth daily.     magnesium oxide (MAG-OX) 400 MG tablet Take 400 mg by mouth daily.     melatonin 5 MG  TABS Take 1 tablet (5 mg total) by mouth at bedtime as needed.  0   omeprazole (PRILOSEC) 40 MG capsule TAKE 1 CAPSULE EVERY DAY 90 capsule 3   potassium chloride (KLOR-CON M) 10 MEQ tablet Take 1 tablet (10 mEq total) by mouth daily. On days that you take lasix 30 tablet 3   torsemide 40 MG TABS Take 40 mg by mouth 2 (two) times daily. 180 tablet 1   vitamin B-12 (CYANOCOBALAMIN) 1000 MCG tablet Take 1,000 mcg by mouth daily with lunch.      Vitamin D, Ergocalciferol, (DRISDOL) 1.25 MG (50000 UNIT) CAPS capsule Take 1 capsule by mouth on Monday and Thursday. 16 capsule 1   fexofenadine (ALLEGRA) 30 MG/5ML suspension Take 30 mg by mouth daily. (Patient not taking: Reported on 08/11/2022)     Multiple Vitamins-Minerals (PRESERVISION AREDS 2) CAPS Take 1 capsule by mouth daily. (Patient not taking: Reported on 08/11/2022)     vitamin E 180 MG (400 UNITS) capsule Take 800 Units by mouth daily with lunch. (Patient not taking: Reported on 06/11/2022)     No current facility-administered medications for this visit.   Facility-Administered Medications Ordered in Other Visits  Medication Dose Route Frequency Provider Last Rate Last Admin   0.9 %  sodium chloride infusion   Intravenous Continuous Lequita Asal, MD 10 mL/hr at 12/11/19 1123 New Bag at 12/11/19 1123     Review of Systems    Ongoing lower extremity edema with chronic, stable dyspnea exertion.  He denies chest pain, palpitations, PND, orthopnea, dizziness, syncope, or early satiety.  All other systems reviewed and are otherwise  negative except as noted above.    Physical Exam    VS:  BP 138/76 (BP Location: Left Arm, Patient Position: Sitting, Cuff Size: Normal)   Pulse 89   Ht 5\' 11"  (1.803 m)   Wt 160 lb 6.4 oz (72.8 kg)   SpO2 95%   BMI 22.37 kg/m  , BMI Body mass index is 22.37 kg/m.     GEN: Well nourished, well developed, in no acute distress. HEENT: normal. Neck: Supple, no bruits or masses.  Moderately elevated JVP. Cardiac: Irregularly irregular no rubs or gallops.  2/6 systolic murmur throughout.  No clubbing, cyanosis, 2+ bilateral lower extremity woody edema of the lower legs extending through the posterior thighs to the hips.  Radials 2+/PT 1+ and equal bilaterally.  Respiratory:  Respirations regular and unlabored, clear to auscultation bilaterally. GI: Soft, nontender, nondistended, BS + x 4. MS: no deformity or atrophy. Skin: warm and dry, no rash. Neuro:  Strength and sensation are intact. Psych: Normal affect.  Accessory Clinical Findings    ECG personally reviewed by me today -atrial fibrillation, 89, left axis deviation, LVH, nonspecific ST and T changes- no acute changes.  Labs from care everywhere dated August 11, 2022:  Sodium 141, potassium 4.2, chloride 109, CO2 27, BUN 30, creatinine 1.35, glucose 79 Calcium 8.6, albumin 3.5, total protein 6.9 Total bilirubin 0.5, alkaline phosphatase 70, AST 47, ALT 27 Magnesium 1.2  Labs from care everywhere dated August 03, 2022:  BUN 36, creatinine 1.67 Magnesium 1.0 Hemoglobin 9.7, hematocrit 29.9, WBC 4.0, platelets 98 Serum iron 36, TIBC 398, iron saturation 9 Ferritin 105.4  Assessment & Plan    1.  Acute on chronic heart failure with preserved ejection fraction: Echo at [facility] in December 2023, showed an EF of 55% with normal bioprosthetic aortic valve and normal RV function.  He subsequently stopped taking  Lasix in January and experienced worsening edema and hypertension.  He was placed back on Lasix 80 mg twice daily  last week with weight reduction from 168 pounds to 161 pounds.  He is currently 160.4 pounds with prior dry weight of 145 pounds in January of this year.  He has significant bilateral lower extremity woody edema extending up to his hips, with JVD on exam as well.  His lungs are clear.  He has chronic, stable dyspnea on exertion but remains bothered by lower extremity edema.  He did have lab work drawn on March 12 which showed a BUN/creatinine of 36 and 1.67 respectively and labs again yesterday with improvement in BUN and creatinine to 30 and 1.35 respectively.  I am going to switch him from furosemide to torsemide 40 mg twice daily to see if he has better response to this, with plan for follow-up basic metabolic panel next week.  He may require intermittently dosed metolazone.  His heart rate and blood pressure are controlled.  Continue ACE inhibitor therapy.  Consider spironolactone and SGLT2 inhibitor in the future pending renal function.  He plans to establish care with [facility] cardiology on April 18.  Pending labs, we will determine if he needs to be seen prior to then.  2.  Essential hypertension: Systolic blood pressure mildly elevated at 138.  Follow with diuresis.  On lisinopril.  3.  Permanent atrial fibrillation: Rate controlled in the absence of beta-blocker therapy.  He is anticoagulated with Eliquis with stable anemia noted on lab work last week.  4.  Hyperlipidemia: LDL of 21 last December.  He remains on statin therapy.  5.  Nonobstructive CAD: Noted at the time of catheterization prior to aortic valve/ascending aortic surgery in 2010.  No chest pain.  Normal LV function by echo in December 2023.  Continue statin.  No aspirin in the setting of chronic Eliquis.  6.  Bioprosthetic aortic valve: Normal functioning valve on recent echocardiogram in December 2023.  7.  Hypomagnesemia: He is on Mag-Ox therapy with repeat magnesium of 1.2 yesterday.  This is being followed by primary care.  We  discussed that he will require additional replacement.  8.  Stage III chronic kidney disease: BUN and creatinine bumped last week and are improved as of labs yesterday.  Follow-up basic metabolic panel in 1 week given change from furosemide to torsemide.  9.  Disposition: Follow-up lab work in 1 week.  He currently has plans to follow-up with [facility] cardiology in mid April.  Pending lab work and symptoms, may ask him to come back here sooner.  Murray Hodgkins, NP 08/11/2022, 5:21 PM

## 2022-08-16 ENCOUNTER — Other Ambulatory Visit: Payer: Self-pay | Admitting: Family Medicine

## 2022-08-16 DIAGNOSIS — F419 Anxiety disorder, unspecified: Secondary | ICD-10-CM

## 2022-08-16 DIAGNOSIS — F4329 Adjustment disorder with other symptoms: Secondary | ICD-10-CM

## 2022-08-20 ENCOUNTER — Inpatient Hospital Stay: Payer: Medicare Other | Attending: Oncology

## 2022-08-20 ENCOUNTER — Inpatient Hospital Stay (HOSPITAL_BASED_OUTPATIENT_CLINIC_OR_DEPARTMENT_OTHER): Payer: Medicare Other | Admitting: Oncology

## 2022-08-20 ENCOUNTER — Encounter: Payer: Self-pay | Admitting: Oncology

## 2022-08-20 ENCOUNTER — Telehealth: Payer: Self-pay | Admitting: Nurse Practitioner

## 2022-08-20 VITALS — BP 138/77 | HR 87 | Temp 98.2°F | Resp 18 | Ht 72.0 in | Wt 151.0 lb

## 2022-08-20 DIAGNOSIS — M069 Rheumatoid arthritis, unspecified: Secondary | ICD-10-CM | POA: Insufficient documentation

## 2022-08-20 DIAGNOSIS — Z85828 Personal history of other malignant neoplasm of skin: Secondary | ICD-10-CM | POA: Diagnosis not present

## 2022-08-20 DIAGNOSIS — N179 Acute kidney failure, unspecified: Secondary | ICD-10-CM

## 2022-08-20 DIAGNOSIS — D649 Anemia, unspecified: Secondary | ICD-10-CM

## 2022-08-20 DIAGNOSIS — D638 Anemia in other chronic diseases classified elsewhere: Secondary | ICD-10-CM | POA: Diagnosis not present

## 2022-08-20 DIAGNOSIS — D509 Iron deficiency anemia, unspecified: Secondary | ICD-10-CM | POA: Diagnosis not present

## 2022-08-20 LAB — BASIC METABOLIC PANEL
Anion gap: 14 (ref 5–15)
BUN: 36 mg/dL — ABNORMAL HIGH (ref 8–23)
CO2: 25 mmol/L (ref 22–32)
Calcium: 8.8 mg/dL — ABNORMAL LOW (ref 8.9–10.3)
Chloride: 97 mmol/L — ABNORMAL LOW (ref 98–111)
Creatinine, Ser: 1.5 mg/dL — ABNORMAL HIGH (ref 0.61–1.24)
GFR, Estimated: 46 mL/min — ABNORMAL LOW (ref >60)
Glucose, Bld: 97 mg/dL (ref 70–99)
Potassium: 3.8 mmol/L (ref 3.5–5.1)
Sodium: 136 mmol/L (ref 135–145)

## 2022-08-20 LAB — CBC WITH DIFFERENTIAL/PLATELET
Abs Immature Granulocytes: 0.03 10*3/uL (ref 0.00–0.07)
Basophils Absolute: 0 10*3/uL (ref 0.0–0.1)
Basophils Relative: 1 %
Eosinophils Absolute: 0.1 10*3/uL (ref 0.0–0.5)
Eosinophils Relative: 2 %
HCT: 29.2 % — ABNORMAL LOW (ref 39.0–52.0)
Hemoglobin: 9.2 g/dL — ABNORMAL LOW (ref 13.0–17.0)
Immature Granulocytes: 0 %
Lymphocytes Relative: 4 %
Lymphs Abs: 0.3 10*3/uL — ABNORMAL LOW (ref 0.7–4.0)
MCH: 30.2 pg (ref 26.0–34.0)
MCHC: 31.5 g/dL (ref 30.0–36.0)
MCV: 95.7 fL (ref 80.0–100.0)
Monocytes Absolute: 0.5 10*3/uL (ref 0.1–1.0)
Monocytes Relative: 7 %
Neutro Abs: 5.8 10*3/uL (ref 1.7–7.7)
Neutrophils Relative %: 86 %
Platelets: 166 10*3/uL (ref 150–400)
RBC: 3.05 MIL/uL — ABNORMAL LOW (ref 4.22–5.81)
RDW: 16.8 % — ABNORMAL HIGH (ref 11.5–15.5)
WBC: 6.7 10*3/uL (ref 4.0–10.5)
nRBC: 0 % (ref 0.0–0.2)

## 2022-08-20 NOTE — Progress Notes (Signed)
Patient seen Pcp for laceration on right foot that is hard to heal, waiting on results to [facility] if he has MRSA

## 2022-08-20 NOTE — Telephone Encounter (Signed)
   Contacted patient this evening to discuss lab work from earlier today.  BUN and creatinine up to 36 and 1.50 after switch to torsemide 40 mg twice daily.  He has lost approximately 10 pounds since his visit here a week ago.  I recommended that he reduce his torsemide to 40 mg once a day.  I have placed an order for a follow-up basic metabolic panel for April 8, through [facility].  Lab Results  Component Value Date   CREATININE 1.50 (H) 08/20/2022   BUN 36 (H) 08/20/2022   NA 136 08/20/2022   K 3.8 08/20/2022   CL 97 (L) 08/20/2022   CO2 25 08/20/2022    Caller verbalized understanding and was grateful for the call back.  Murray Hodgkins, NP 08/20/2022, 5:42 PM

## 2022-08-22 ENCOUNTER — Encounter: Payer: Self-pay | Admitting: Hematology and Oncology

## 2022-08-22 NOTE — Progress Notes (Signed)
Hematology/Oncology Consult note [facility]  [phone] [phone]  Patient Care Team: Narda Amber Pap, MD as PCP - General (Family Medicine) Minna Merritts, MD as PCP - Cardiology (Cardiology) Minna Merritts, MD as Consulting Physician (Cardiology) Emmaline Kluver., MD (Rheumatology) Kary Kos, MD as Consulting Physician (Neurosurgery) Jonathon Bellows, MD as Consulting Physician (Gastroenterology)   Name of the patient: Bryan [name]  SX:[id number]  [birth year]   Date of visit: 08/22/22  Diagnosis- anemia of chronic disease with a component of iron deficiency  Chief complaint/ Reason for visit- routine f/u of anemia  Heme/Onc history: Patient is a 82 year old male who has seen me in the past for 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 Hemoglobin at baseline runs around 10. He is also had a bone marrow biopsy back in 2018 which revealed normocellular marrow with trilineage hematopoiesis and maturation with normal cytogenetics and FISH. He has had history of iron deficiency anemia in the past and has required IV iron.     Interval history- Patient is in the process of transferring all his care to [facility]. He recently had a laceration involving his right foot and has a dressing in place.   ECOG PS- 2-3 Pain scale- 3   Review of systems- Review of Systems  Constitutional:  Positive for malaise/fatigue. Negative for chills, fever and weight loss.  HENT:  Negative for congestion, ear discharge and nosebleeds.   Eyes:  Negative for blurred vision.  Respiratory:  Negative for cough, hemoptysis, sputum production, shortness of breath and wheezing.   Cardiovascular:  Positive for leg swelling. Negative for chest pain, palpitations, orthopnea and claudication.  Gastrointestinal:  Negative for abdominal pain, blood in stool, constipation, diarrhea, heartburn, melena, nausea and vomiting.  Genitourinary:   Negative for dysuria, flank pain, frequency, hematuria and urgency.  Musculoskeletal:  Negative for back pain, joint pain and myalgias.  Skin:  Negative for rash.  Neurological:  Negative for dizziness, tingling, focal weakness, seizures, weakness and headaches.  Endo/Heme/Allergies:  Does not bruise/bleed easily.  Psychiatric/Behavioral:  Negative for depression and suicidal ideas. The patient does not have insomnia.       Allergies  Allergen Reactions   Penicillins Hives, Swelling and Other (See Comments)    SWELLING REACTION UNSPECIFIED PATIENT HAS TAKEN AMOXICILLIN ON MED HX FROM [facility] PATIENT HAS HAD A PCN REACTION WITH IMMEDIATE RASH, FACIAL/TONGUE/THROAT SWELLING, SOB, OR LIGHTHEADEDNESS WITH HYPOTENSION:  #  #  #  YES  #  #  #   Has patient had a PCN reaction causing severe rash involving mucus membranes or skin necrosis: No Has patient had a PCN reaction that required hospitalization No Has patient had a PCN reaction occurring within the last 10 years: No   Demerol [Meperidine] Hives and Nausea And Vomiting   Other      Past Medical History:  Diagnosis Date   Abnormal CT scan    Asymmetric left rectal wall thickening    Anemia    Anxiety    Basal cell carcinoma 04/28/2020   L forehead - ED&C 06/17/2020   Basal cell carcinoma 05/06/2021   left scalp postauricular ant, EDC   Basal cell carcinoma 05/06/2021   left scalp postauricular post, [facility]   Chronic heart failure with preserved ejection fraction (HFpEF) ([place])    a. 02/2022 Echo: EF 60-65%; b. 04/2022 Echo: EF >55%, nl prosth valve fx, sev dil LA, nl RV fxn, mod dil RA.   Chronic lower back  pain    Complication of anesthesia    Memory loss 09/2015   Coronary artery disease    a. 2010 Cath: mild-mod CAD per notes.   Depression    GERD (gastroesophageal reflux disease)    Glaucoma    H/O thoracic aortic aneurysm repair    Headache    Heart murmur    History of being hospitalized    memory lose kidney funtion down  blood pressure up   History of blood transfusion    "think he had one when he had heart valve OR" (10/14/2016); "none since" (10/19/2017)   History of chicken pox    Hyperlipidemia    Hypertension    Lumbar stenosis    Murmur    Neuropathy    Osteoarthritis    worse in feet and ankles   Permanent atrial fibrillation ([place])    a. CHA2DS2VASc = 5 -->eliquis   Poor short term memory    takes Aricept   Rheumatoid arthritis ([place])    "all over" (10/14/2016)   Schizophrenia ([place])    Seasonal allergies    Thoracic spine pain    Valvular heart disease    Wears glasses    reading     Past Surgical History:  Procedure Laterality Date   AORTIC VALVE REPLACEMENT  2007   [facility].  Supply, [place]; "pig valve"   APPENDECTOMY  2010   BACK SURGERY     CARDIAC VALVE REPLACEMENT     CATARACT EXTRACTION W/ INTRAOCULAR LENS  IMPLANT, BILATERAL Bilateral 2012   COLONOSCOPY WITH PROPOFOL N/A 09/24/2016   Procedure: COLONOSCOPY WITH PROPOFOL;  Surgeon: Jonathon Bellows, MD;  Location: [facility] ENDOSCOPY;  Service: Endoscopy;  Laterality: N/A;   ESOPHAGOGASTRODUODENOSCOPY (EGD) WITH PROPOFOL N/A 09/24/2016   Procedure: ESOPHAGOGASTRODUODENOSCOPY (EGD) WITH PROPOFOL;  Surgeon: Jonathon Bellows, MD;  Location[facility] ENDOSCOPY;  Service: Endoscopy;  Laterality: N/A;   GIVENS CAPSULE STUDY N/A 09/24/2016   Procedure: GIVENS CAPSULE STUDY;  Surgeon: Jonathon Bellows, MD;  Location: [facility] ENDOSCOPY;  Service: Endoscopy;  Laterality: N/A;   HAMMER TOE SURGERY Right 10/09/2015   Procedure: HAMMER TOE REPAIR WITH K-WIRE FIXATION RIGHT SECOND TOE;  Surgeon: Albertine Patricia, DPM;  Location: [place];  Service: Podiatry;  Laterality: Right;  WITH LOCAL   HARDWARE REMOVAL N/A 11/02/2019   Procedure: Removal of hardware thoracic spine;  Surgeon: Kary Kos, MD;  Location: [place];  Service: Neurosurgery;  Laterality: N/A;  posterior   INGUINAL HERNIA REPAIR Right 05/05/2018   Medium Bard PerFix plug.  Surgeon: Robert Bellow, MD;  Location: [facility] ORS;  Service: General;  Laterality: Right;   IR VERTEBROPLASTY CERV/THOR BX INC UNI/BIL INC/INJECT/IMAGING  10/27/2018   JOINT REPLACEMENT Left    left total knee   LAMINECTOMY WITH POSTERIOR LATERAL ARTHRODESIS LEVEL 3 N/A 09/28/2017   Procedure: LUMBAR  POSTEROR  FUSION REVISION - LUMBAR ONE -TWO, LUMBAR TWO -THREE,THREE-FOUR ,BILATERAL ARTHRODESIS REMOVAL LUMBAR THREE HARDWARE;  Surgeon: Kary Kos, MD;  Location: [place];  Service: Neurosurgery;  Laterality: N/A;   LAMINECTOMY WITH POSTERIOR LATERAL ARTHRODESIS LEVEL 3 N/A 10/20/2017   Procedure: Revision fusion and removal of hardware Lumbar one, Pedicle screw fixation thoracic ten-lumbar two, thoracic nine-ten laminotomy, Posterior Lumbar Arthrodesis thoracic ten-lumbar two ;  Surgeon: Kary Kos, MD;  Location: [place];  Service: Neurosurgery;  Laterality: N/A;   LAPAROSCOPIC CHOLECYSTECTOMY  2010   LUMBAR FUSION Right 06/08/2017   LUMBAR THREE-FOUR, LUMBAR FOUR-FIVE POSTEROLATERAL ARTHRODESIS WITH RIGHT LUMBAR FOUR-FIVE LAMINECTOMY/FORAMINOTOMY   LUMBAR  LAMINECTOMY/DECOMPRESSION MICRODISCECTOMY Right 03/08/2016   Procedure: Laminectomy and Foraminotomy - Lumbar Five-Sacral One Right;  Surgeon: Kary Kos, MD;  Location: [place];  Service: Neurosurgery;  Laterality: Right;  Right   MULTIPLE TOOTH EXTRACTIONS     "3"   OTOPLASATY Right 12/15/2021   Procedure: Right auricle repair;  Surgeon: Margaretha Sheffield, MD;  Location: [facility] ORS;  Service: ENT;  Laterality: Right;   POSTERIOR LUMBAR FUSION  10/14/2016   L5-S1   REPLACEMENT TOTAL KNEE Left 2003   THORACIC AORTIC ANEURYSM REPAIR  2010   TOTAL KNEE ARTHROPLASTY Right 08/2021    Social History   Socioeconomic History   Marital status: Widowed    Spouse name: Not on file   Number of children: Not on file   Years of education: Not on file   Highest education level: Not on file  Occupational History   Occupation: retired  Tobacco Use   Smoking status: Former     Packs/day: 1.00    Years: 32.00    Additional pack years: 0.00    Total pack years: 32.00    Types: Cigarettes    Quit date: 07/05/1981    Years since quitting: 41.1   Smokeless tobacco: Never  Vaping Use   Vaping Use: Never used  Substance and Sexual Activity   Alcohol use: Not Currently    Comment: "glass of wine/month; if that"   Drug use: Never   Sexual activity: Not Currently  Other Topics Concern   Not on file  Social History Narrative   Not on file   Social Determinants of Health   Financial Resource Strain: [place]  (02/22/2022)   Overall Financial Resource Strain (CARDIA)    Difficulty of Paying Living Expenses: Not hard at all  Food Insecurity: No Food Insecurity (03/20/2022)   Hunger Vital Sign    Worried About Running Out of Food in the Last Year: Never true    [place] in the Last Year: Never true  Transportation Needs: No Transportation Needs (03/20/2022)   PRAPARE - Hydrologist (Medical): No    Lack of Transportation (Non-Medical): No  Physical Activity: Sufficiently Active (02/22/2022)   Exercise Vital Sign    Days of Exercise per Week: 5 days    Minutes of Exercise per Session: 30 min  Stress: No Stress Concern Present (02/22/2022)   [place]    Feeling of Stress : Not at all  Social Connections: Unknown (02/22/2022)   Social Connection and Isolation Panel [NHANES]    Frequency of Communication with Friends and Family: More than three times a week    Frequency of Social Gatherings with Friends and Family: More than three times a week    Attends Religious Services: More than 4 times per year    Active Member of [employer] or Organizations: Yes    Attends Archivist Meetings: More than 4 times per year    Marital Status: Not on file  Intimate Partner Violence: Not At Risk (03/20/2022)   Humiliation, Afraid, Rape, and Kick questionnaire    Fear  of Current or Ex-Partner: No    Emotionally Abused: No    Physically Abused: No    Sexually Abused: No    Family History  Problem Relation Age of Onset   Heart failure Mother    Hypertension Mother    Asthma Mother    Osteoporosis Mother    Heart attack Father 67  MI   Hypertension Sister    Prostate cancer Neg Hx    Bladder Cancer Neg Hx    Kidney cancer Neg Hx    Colon cancer Neg Hx      Current Outpatient Medications:    albuterol (VENTOLIN HFA) 108 (90 Base) MCG/ACT inhaler, Inhale into the lungs., Disp: , Rfl:    apixaban (ELIQUIS) 5 MG TABS tablet, Take 1 tablet (5 mg total) by mouth 2 (two) times daily., Disp: 60 tablet, Rfl: 3   atorvastatin (LIPITOR) 40 MG tablet, TAKE 1 TABLET BY MOUTH DAILY, Disp: 90 tablet, Rfl: 3   calcium carbonate (OS-CAL - DOSED IN MG OF ELEMENTAL CALCIUM) 1250 (500 Ca) MG tablet, Take 1 tablet by mouth daily with lunch., Disp: , Rfl:    donepezil (ARICEPT) 10 MG tablet, Take 1 tablet by mouth at bedtime., Disp: , Rfl:    feeding supplement (ENSURE ENLIVE / ENSURE PLUS) LIQD, Take 237 mLs by mouth 2 (two) times daily between meals., Disp: 237 mL, Rfl: 12   fenofibrate 160 MG tablet, Take 1 tablet (160 mg total) by mouth daily., Disp: 30 tablet, Rfl: 3   ferrous sulfate 325 (65 FE) MG tablet, Take 325 mg by mouth daily with breakfast., Disp: , Rfl:    FLUoxetine (PROZAC) 20 MG capsule, TAKE 1 CAPSULE BY MOUTH ONCE DAILY, Disp: 90 capsule, Rfl: 1   gabapentin (NEURONTIN) 100 MG capsule, Take 1 capsule (100 mg total) by mouth 3 (three) times daily., Disp: 90 capsule, Rfl: 0   HYDROcodone-acetaminophen (HYCET) 7.5-325 mg/15 ml solution, Take 1 tablet by mouth 4 (four) times daily as needed for moderate pain., Disp: , Rfl:    hydrocortisone cream 1 %, Apply 1 application topically daily as needed for itching., Disp: , Rfl:    ipratropium (ATROVENT) 0.06 % nasal spray, Place 2 sprays into both nostrils 4 (four) times daily., Disp: 15 mL, Rfl: 12    lisinopril (ZESTRIL) 20 MG tablet, Take 30 mg by mouth daily., Disp: , Rfl:    loperamide (IMODIUM) 2 MG capsule, Take 1 capsule (2 mg total) by mouth as needed for diarrhea or loose stools., Disp: 30 capsule, Rfl: 0   loratadine (CLARITIN) 10 MG tablet, Take 10 mg by mouth daily., Disp: , Rfl:    magnesium oxide (MAG-OX) 400 MG tablet, Take 400 mg by mouth daily., Disp: , Rfl:    melatonin 5 MG TABS, Take 1 tablet (5 mg total) by mouth at bedtime as needed., Disp: , Rfl: 0   omeprazole (PRILOSEC) 40 MG capsule, TAKE 1 CAPSULE EVERY DAY, Disp: 90 capsule, Rfl: 3   potassium chloride (KLOR-CON M) 10 MEQ tablet, Take 1 tablet (10 mEq total) by mouth daily. On days that you take lasix, Disp: 30 tablet, Rfl: 3   potassium chloride (KLOR-CON) 10 MEQ tablet, Take 10 mEq by mouth daily., Disp: , Rfl:    torsemide 40 MG TABS, Take 40 mg by mouth 2 (two) times daily., Disp: 180 tablet, Rfl: 1   vitamin B-12 (CYANOCOBALAMIN) 1000 MCG tablet, Take 1,000 mcg by mouth daily with lunch. , Disp: , Rfl:    Vitamin D, Ergocalciferol, (DRISDOL) 1.25 MG (50000 UNIT) CAPS capsule, Take 1 capsule by mouth on Monday and Thursday., Disp: 16 capsule, Rfl: 1   vitamin E 180 MG (400 UNITS) capsule, Take 800 Units by mouth daily with lunch., Disp: , Rfl:    fexofenadine (ALLEGRA) 30 MG/5ML suspension, Take 30 mg by mouth daily. (Patient not taking: Reported on  08/11/2022), Disp: , Rfl:    Multiple Vitamins-Minerals (PRESERVISION AREDS 2) CAPS, Take 1 capsule by mouth daily. (Patient not taking: Reported on 08/11/2022), Disp: , Rfl:  No current facility-administered medications for this visit.  Facility-Administered Medications Ordered in Other Visits:    0.9 %  sodium chloride infusion, , Intravenous, Continuous, Corcoran, Melissa C, MD, Last Rate: 10 mL/hr at 12/11/19 1123, New Bag at 12/11/19 1123  Physical exam:  Vitals:   08/20/22 1339  BP: 138/77  Pulse: 87  Resp: 18  Temp: 98.2 F (36.8 C)  TempSrc: Tympanic   SpO2: 100%  Weight: 151 lb (68.5 kg)  Height: 6' (1.829 m)   Physical Exam Constitutional:      Comments: Elderly frail male sitting in a wheelchair.dressing in place over right foot         Latest Ref Rng & Units 08/20/2022    1:18 PM  CMP  Glucose 70 - 99 mg/dL 97   BUN 8 - 23 mg/dL 36   Creatinine 0.61 - 1.24 mg/dL 1.50   Sodium 135 - 145 mmol/L 136   Potassium 3.5 - 5.1 mmol/L 3.8   Chloride 98 - 111 mmol/L 97   CO2 22 - 32 mmol/L 25   Calcium 8.9 - 10.3 mg/dL 8.8       Latest Ref Rng & Units 08/20/2022    1:18 PM  CBC  WBC 4.0 - 10.5 K/uL 6.7   Hemoglobin 13.0 - 17.0 g/dL 9.2   Hematocrit 39.0 - 52.0 % 29.2   Platelets 150 - 400 K/uL 166     Assessment and plan- Patient is a 81 y.o. male here for routine f/u of anemia  Patient's baseline hemoglobin is between 9-10 likely secondary to chronic disease.  It has dropped down to the 70s in the past when he has acute medical issues going on.  Presently perjeta 94.  He had a ferritin and iron studies checked 2 weeks ago.  Although ferritin levels were normal at 105 which could be falsely normal in the setting of chronic disease.  Iron saturation was low at 9%.  It may be reasonable to offer him IV iron at this time.  However, patient is in the process of transferring his care to [facility] for convenience and he has an upcoming appointment with them in June 2024.  He would like to avoid IV iron at this time.  He will continue oral iron.  I am not giving him any further appointments with me but he can reach out to me if he has any questions or concerns.    Visit Diagnosis 1. Anemia of chronic disease   2. Iron deficiency anemia, unspecified iron deficiency anemia type      Dr. Randa Evens, MD, MPH [facility] [id number] 08/22/2022 11:13 AM

## 2022-09-08 NOTE — Telephone Encounter (Signed)
Called the patient to remind him about the BMET lab work needed. He stated that he is now being seen with [facility] cardiology. They have drawn the labs.

## 2022-09-21 ENCOUNTER — Other Ambulatory Visit: Payer: Self-pay | Admitting: Gastroenterology

## 2022-09-21 ENCOUNTER — Other Ambulatory Visit: Payer: Self-pay | Admitting: Cardiovascular Disease

## 2022-09-21 NOTE — Telephone Encounter (Signed)
Prescription refill request for Eliquis received. Indication: Afib  Last office visit: 08/11/22 Bryan [name])  Scr: 1.50 (08/20/22)  Age: 82 Weight: 68.5kg  Per dosing criteria, pt's current Eliquis dose may not  be appropriate. Will forward to PharmD to review.

## 2022-09-21 NOTE — Telephone Encounter (Signed)
Refill Request.  

## 2022-09-22 NOTE — Telephone Encounter (Signed)
Per Margaretmary Dys, PharmD, pt should continue 5mg  BID at this time. Refill sent.

## 2022-09-22 NOTE — Telephone Encounter (Signed)
Ok to continue current 5mg  BID dose for now - SCr right at 1.5 threshold and historically has been < 1.5. If SCr is > 1.5 whenever labs are checked next, would decrease dose at that time.

## 2022-10-28 ENCOUNTER — Other Ambulatory Visit: Payer: Self-pay | Admitting: Cardiovascular Disease

## 2022-10-29 NOTE — Telephone Encounter (Signed)
08/20/22 phone note--He stated that he is now being seen with [facility] cardiology.  Was seen by Dr. Nedra Hai at [facility] on 09/01/22 and does have follow up scheduled with [facility] office.

## 2023-01-25 ENCOUNTER — Inpatient Hospital Stay
Admission: EM | Admit: 2023-01-25 | Discharge: 2023-01-31 | DRG: 536 | Disposition: A | Discharge status: Skilled Nursing Facility | Payer: Medicare Other | Attending: Internal Medicine | Admitting: Internal Medicine

## 2023-01-25 ENCOUNTER — Emergency Department: Payer: Medicare Other

## 2023-01-25 ENCOUNTER — Other Ambulatory Visit: Payer: Self-pay

## 2023-01-25 DIAGNOSIS — Z885 Allergy status to narcotic agent status: Secondary | ICD-10-CM

## 2023-01-25 DIAGNOSIS — J432 Centrilobular emphysema: Secondary | ICD-10-CM | POA: Diagnosis present

## 2023-01-25 DIAGNOSIS — S0083XA Contusion of other part of head, initial encounter: Secondary | ICD-10-CM | POA: Diagnosis present

## 2023-01-25 DIAGNOSIS — I5032 Chronic diastolic (congestive) heart failure: Secondary | ICD-10-CM | POA: Diagnosis present

## 2023-01-25 DIAGNOSIS — F0390 Unspecified dementia without behavioral disturbance: Secondary | ICD-10-CM | POA: Diagnosis present

## 2023-01-25 DIAGNOSIS — I5020 Unspecified systolic (congestive) heart failure: Secondary | ICD-10-CM | POA: Diagnosis not present

## 2023-01-25 DIAGNOSIS — W01198A Fall on same level from slipping, tripping and stumbling with subsequent striking against other object, initial encounter: Secondary | ICD-10-CM | POA: Diagnosis present

## 2023-01-25 DIAGNOSIS — Z8249 Family history of ischemic heart disease and other diseases of the circulatory system: Secondary | ICD-10-CM

## 2023-01-25 DIAGNOSIS — D61818 Other pancytopenia: Secondary | ICD-10-CM | POA: Diagnosis not present

## 2023-01-25 DIAGNOSIS — F209 Schizophrenia, unspecified: Secondary | ICD-10-CM | POA: Diagnosis present

## 2023-01-25 DIAGNOSIS — M069 Rheumatoid arthritis, unspecified: Secondary | ICD-10-CM | POA: Diagnosis present

## 2023-01-25 DIAGNOSIS — D509 Iron deficiency anemia, unspecified: Secondary | ICD-10-CM | POA: Diagnosis present

## 2023-01-25 DIAGNOSIS — I482 Chronic atrial fibrillation, unspecified: Secondary | ICD-10-CM | POA: Diagnosis not present

## 2023-01-25 DIAGNOSIS — M159 Polyosteoarthritis, unspecified: Secondary | ICD-10-CM | POA: Diagnosis present

## 2023-01-25 DIAGNOSIS — I130 Hypertensive heart and chronic kidney disease with heart failure and stage 1 through stage 4 chronic kidney disease, or unspecified chronic kidney disease: Secondary | ICD-10-CM | POA: Diagnosis present

## 2023-01-25 DIAGNOSIS — S72102A Unspecified trochanteric fracture of left femur, initial encounter for closed fracture: Secondary | ICD-10-CM

## 2023-01-25 DIAGNOSIS — Z981 Arthrodesis status: Secondary | ICD-10-CM

## 2023-01-25 DIAGNOSIS — N400 Enlarged prostate without lower urinary tract symptoms: Secondary | ICD-10-CM | POA: Diagnosis present

## 2023-01-25 DIAGNOSIS — H409 Unspecified glaucoma: Secondary | ICD-10-CM | POA: Diagnosis present

## 2023-01-25 DIAGNOSIS — G629 Polyneuropathy, unspecified: Secondary | ICD-10-CM | POA: Diagnosis present

## 2023-01-25 DIAGNOSIS — D696 Thrombocytopenia, unspecified: Secondary | ICD-10-CM | POA: Diagnosis present

## 2023-01-25 DIAGNOSIS — E785 Hyperlipidemia, unspecified: Secondary | ICD-10-CM | POA: Diagnosis present

## 2023-01-25 DIAGNOSIS — S79912A Unspecified injury of left hip, initial encounter: Secondary | ICD-10-CM

## 2023-01-25 DIAGNOSIS — Z952 Presence of prosthetic heart valve: Secondary | ICD-10-CM | POA: Diagnosis not present

## 2023-01-25 DIAGNOSIS — S72115A Nondisplaced fracture of greater trochanter of left femur, initial encounter for closed fracture: Secondary | ICD-10-CM | POA: Diagnosis present

## 2023-01-25 DIAGNOSIS — Z8262 Family history of osteoporosis: Secondary | ICD-10-CM | POA: Diagnosis not present

## 2023-01-25 DIAGNOSIS — S72002A Fracture of unspecified part of neck of left femur, initial encounter for closed fracture: Secondary | ICD-10-CM | POA: Diagnosis not present

## 2023-01-25 DIAGNOSIS — Z825 Family history of asthma and other chronic lower respiratory diseases: Secondary | ICD-10-CM

## 2023-01-25 DIAGNOSIS — Z87891 Personal history of nicotine dependence: Secondary | ICD-10-CM

## 2023-01-25 DIAGNOSIS — I2510 Atherosclerotic heart disease of native coronary artery without angina pectoris: Secondary | ICD-10-CM | POA: Diagnosis present

## 2023-01-25 DIAGNOSIS — N1831 Chronic kidney disease, stage 3a: Secondary | ICD-10-CM | POA: Diagnosis present

## 2023-01-25 DIAGNOSIS — Z7901 Long term (current) use of anticoagulants: Secondary | ICD-10-CM

## 2023-01-25 DIAGNOSIS — I480 Paroxysmal atrial fibrillation: Secondary | ICD-10-CM | POA: Diagnosis present

## 2023-01-25 DIAGNOSIS — Z953 Presence of xenogenic heart valve: Secondary | ICD-10-CM | POA: Diagnosis not present

## 2023-01-25 DIAGNOSIS — Z9841 Cataract extraction status, right eye: Secondary | ICD-10-CM

## 2023-01-25 DIAGNOSIS — S0990XA Unspecified injury of head, initial encounter: Secondary | ICD-10-CM

## 2023-01-25 DIAGNOSIS — Z23 Encounter for immunization: Secondary | ICD-10-CM

## 2023-01-25 DIAGNOSIS — N183 Chronic kidney disease, stage 3 unspecified: Secondary | ICD-10-CM | POA: Diagnosis not present

## 2023-01-25 DIAGNOSIS — K219 Gastro-esophageal reflux disease without esophagitis: Secondary | ICD-10-CM | POA: Diagnosis present

## 2023-01-25 DIAGNOSIS — Y92010 Kitchen of single-family (private) house as the place of occurrence of the external cause: Secondary | ICD-10-CM

## 2023-01-25 DIAGNOSIS — Z79899 Other long term (current) drug therapy: Secondary | ICD-10-CM

## 2023-01-25 DIAGNOSIS — I4821 Permanent atrial fibrillation: Secondary | ICD-10-CM | POA: Diagnosis present

## 2023-01-25 DIAGNOSIS — Z9049 Acquired absence of other specified parts of digestive tract: Secondary | ICD-10-CM

## 2023-01-25 DIAGNOSIS — I10 Essential (primary) hypertension: Secondary | ICD-10-CM | POA: Diagnosis not present

## 2023-01-25 DIAGNOSIS — Z96653 Presence of artificial knee joint, bilateral: Secondary | ICD-10-CM | POA: Diagnosis present

## 2023-01-25 DIAGNOSIS — W19XXXA Unspecified fall, initial encounter: Principal | ICD-10-CM

## 2023-01-25 DIAGNOSIS — S72009A Fracture of unspecified part of neck of unspecified femur, initial encounter for closed fracture: Secondary | ICD-10-CM | POA: Diagnosis present

## 2023-01-25 DIAGNOSIS — Z9842 Cataract extraction status, left eye: Secondary | ICD-10-CM

## 2023-01-25 DIAGNOSIS — Z880 Allergy status to penicillin: Secondary | ICD-10-CM

## 2023-01-25 DIAGNOSIS — J439 Emphysema, unspecified: Secondary | ICD-10-CM

## 2023-01-25 DIAGNOSIS — Z85828 Personal history of other malignant neoplasm of skin: Secondary | ICD-10-CM

## 2023-01-25 DIAGNOSIS — Z961 Presence of intraocular lens: Secondary | ICD-10-CM | POA: Diagnosis present

## 2023-01-25 LAB — BASIC METABOLIC PANEL
Anion gap: 7 (ref 5–15)
BUN: 32 mg/dL — ABNORMAL HIGH (ref 8–23)
CO2: 23 mmol/L (ref 22–32)
Calcium: 8.5 mg/dL — ABNORMAL LOW (ref 8.9–10.3)
Chloride: 104 mmol/L (ref 98–111)
Creatinine, Ser: 1.48 mg/dL — ABNORMAL HIGH (ref 0.61–1.24)
GFR, Estimated: 46 mL/min — ABNORMAL LOW (ref >60)
Glucose, Bld: 86 mg/dL (ref 70–99)
Potassium: 4.3 mmol/L (ref 3.5–5.1)
Sodium: 134 mmol/L — ABNORMAL LOW (ref 135–145)

## 2023-01-25 LAB — CBC WITH DIFFERENTIAL/PLATELET
Abs Immature Granulocytes: 0.02 10*3/uL (ref 0.00–0.07)
Basophils Absolute: 0 10*3/uL (ref 0.0–0.1)
Basophils Relative: 1 %
Eosinophils Absolute: 0.1 10*3/uL (ref 0.0–0.5)
Eosinophils Relative: 2 %
HCT: 24.4 % — ABNORMAL LOW (ref 39.0–52.0)
Hemoglobin: 7.4 g/dL — ABNORMAL LOW (ref 13.0–17.0)
Immature Granulocytes: 1 %
Lymphocytes Relative: 6 %
Lymphs Abs: 0.3 10*3/uL — ABNORMAL LOW (ref 0.7–4.0)
MCH: 33.8 pg (ref 26.0–34.0)
MCHC: 30.3 g/dL (ref 30.0–36.0)
MCV: 111.4 fL — ABNORMAL HIGH (ref 80.0–100.0)
Monocytes Absolute: 0.4 10*3/uL (ref 0.1–1.0)
Monocytes Relative: 9 %
Neutro Abs: 3.6 10*3/uL (ref 1.7–7.7)
Neutrophils Relative %: 81 %
Platelets: 100 10*3/uL — ABNORMAL LOW (ref 150–400)
RBC: 2.19 MIL/uL — ABNORMAL LOW (ref 4.22–5.81)
RDW: 21.2 % — ABNORMAL HIGH (ref 11.5–15.5)
WBC: 4.4 10*3/uL (ref 4.0–10.5)
nRBC: 0 % (ref 0.0–0.2)

## 2023-01-25 LAB — APTT: aPTT: 46 s — ABNORMAL HIGH (ref 24–36)

## 2023-01-25 LAB — PROTIME-INR
INR: 2.8 — ABNORMAL HIGH (ref 0.8–1.2)
Prothrombin Time: 30.1 s — ABNORMAL HIGH (ref 11.4–15.2)

## 2023-01-25 MED ORDER — APIXABAN 5 MG PO TABS
5.0000 mg | ORAL_TABLET | Freq: Two times a day (BID) | ORAL | Status: DC
  Administered 2023-01-25 – 2023-01-31 (×12): 5 mg via ORAL
  Filled 2023-01-25 (×12): qty 1

## 2023-01-25 MED ORDER — GABAPENTIN 100 MG PO CAPS
100.0000 mg | ORAL_CAPSULE | Freq: Three times a day (TID) | ORAL | Status: DC
  Administered 2023-01-25 – 2023-01-31 (×19): 100 mg via ORAL
  Filled 2023-01-25 (×19): qty 1

## 2023-01-25 MED ORDER — HYDROCODONE-ACETAMINOPHEN 7.5-325 MG PO TABS
1.0000 | ORAL_TABLET | ORAL | Status: DC | PRN
  Administered 2023-01-25 – 2023-01-31 (×21): 1 via ORAL
  Filled 2023-01-25 (×23): qty 1

## 2023-01-25 MED ORDER — POTASSIUM CHLORIDE CRYS ER 20 MEQ PO TBCR
10.0000 meq | EXTENDED_RELEASE_TABLET | Freq: Every day | ORAL | Status: DC
  Administered 2023-01-26 – 2023-01-31 (×6): 10 meq via ORAL
  Filled 2023-01-25 (×6): qty 1

## 2023-01-25 MED ORDER — TORSEMIDE 20 MG PO TABS
40.0000 mg | ORAL_TABLET | Freq: Two times a day (BID) | ORAL | Status: DC
  Administered 2023-01-30: 40 mg via ORAL
  Filled 2023-01-25 (×4): qty 2

## 2023-01-25 MED ORDER — ATORVASTATIN CALCIUM 20 MG PO TABS
40.0000 mg | ORAL_TABLET | Freq: Every day | ORAL | Status: DC
  Administered 2023-01-25 – 2023-01-31 (×7): 40 mg via ORAL
  Filled 2023-01-25 (×7): qty 2

## 2023-01-25 MED ORDER — DONEPEZIL HCL 5 MG PO TABS
10.0000 mg | ORAL_TABLET | Freq: Every day | ORAL | Status: DC
  Administered 2023-01-25 – 2023-01-30 (×6): 10 mg via ORAL
  Filled 2023-01-25 (×7): qty 2

## 2023-01-25 MED ORDER — ALBUTEROL SULFATE (2.5 MG/3ML) 0.083% IN NEBU
3.0000 mL | INHALATION_SOLUTION | RESPIRATORY_TRACT | Status: DC | PRN
  Administered 2023-01-25 – 2023-01-28 (×6): 3 mL via RESPIRATORY_TRACT
  Filled 2023-01-25 (×7): qty 3

## 2023-01-25 MED ORDER — ONDANSETRON HCL 4 MG PO TABS: 4.0000 mg | ORAL_TABLET | Freq: Four times a day (QID) | ORAL | Status: DC | PRN

## 2023-01-25 MED ORDER — PANTOPRAZOLE SODIUM 40 MG PO TBEC
40.0000 mg | DELAYED_RELEASE_TABLET | Freq: Every day | ORAL | Status: DC
  Administered 2023-01-25 – 2023-01-31 (×7): 40 mg via ORAL
  Filled 2023-01-25 (×7): qty 1

## 2023-01-25 MED ORDER — LISINOPRIL 10 MG PO TABS
10.0000 mg | ORAL_TABLET | Freq: Every day | ORAL | Status: DC
  Administered 2023-01-26 – 2023-01-30 (×4): 10 mg via ORAL
  Filled 2023-01-25 (×6): qty 1

## 2023-01-25 MED ORDER — INFLUENZA VAC A&B SURF ANT ADJ 0.5 ML IM SUSY
0.5000 mL | PREFILLED_SYRINGE | INTRAMUSCULAR | Status: AC
  Administered 2023-01-27: 0.5 mL via INTRAMUSCULAR
  Filled 2023-01-25 (×2): qty 0.5

## 2023-01-25 MED ORDER — ONDANSETRON HCL 4 MG/2ML IJ SOLN: 4.0000 mg | Freq: Four times a day (QID) | INTRAMUSCULAR | Status: DC | PRN

## 2023-01-25 MED ORDER — FLUOXETINE HCL 20 MG PO CAPS
20.0000 mg | ORAL_CAPSULE | Freq: Every day | ORAL | Status: DC
  Administered 2023-01-25 – 2023-01-31 (×7): 20 mg via ORAL
  Filled 2023-01-25 (×7): qty 1

## 2023-01-25 MED ORDER — ACETAMINOPHEN 325 MG PO TABS
650.0000 mg | ORAL_TABLET | Freq: Once | ORAL | Status: AC
  Administered 2023-01-25: 650 mg via ORAL
  Filled 2023-01-25: qty 2

## 2023-01-25 NOTE — Assessment & Plan Note (Deleted)
Iron deficiency Cell lines stable.  No active bleeding  Hbg borderline 6.8>>7.8>>7.0 --Repeat Hbg this afternoon --IV iron infusion given 9/4 --Monitor CBC --Transfuse for hgb <7

## 2023-01-25 NOTE — H&P (Signed)
History and Physical    Patient: Bryan [name] WUX:[id number] DOB: March 07, 1941 DOA: 01/25/2023 DOS: the patient was seen and examined on 01/25/2023 PCP: Karie Georges Pap, MD  Patient coming from: Home  Chief Complaint:  Chief Complaint  Patient presents with   Fall   HPI: Bryan [name] is a 82 y.o. male with medical history significant of HTN, HLD, HFrEF (LVEF 40% 09/2022), A-fib (on Eliquis), centrilobular emphysema, bicuspid AV s/p bioprosthetic aortic valve (2010), CKD stage III, rheumatoid arthritis, iron deficiency anemia, BPH, GERD, prior SBO s/p hemicolectomy and ileal resection (2023) presenting with fall and left hip fracture.  Patient noted to have been recently admitted in the [facility] system August 22 through August 28 for acute HFrEF.  History primarily from patient's sister in the setting of baseline dementia.  Per report, patient with overall progressive decline over several months.  Currently lives at home alone.  Per report, had a mechanical fall where patient hit his head and landed on his left side.  EMS transport was initially refused.  However, patient woke up with the inability to ambulate on the left side.  Family unclear if patient is been taking his medications as prescribed. Review of Systems: As mentioned in the history of present illness. All other systems reviewed and are negative. Past Medical History:  Diagnosis Date   Abnormal CT scan    Asymmetric left rectal wall thickening    Anemia    Anxiety    Basal cell carcinoma 04/28/2020   L forehead - ED&C 06/17/2020   Basal cell carcinoma 05/06/2021   left scalp postauricular ant, EDC   Basal cell carcinoma 05/06/2021   left scalp postauricular post, EDC   Chronic heart failure with preserved ejection fraction (HFpEF) (HCC)    a. 02/2022 Echo: EF 60-65%; b. 04/2022 Echo: EF >55%, nl prosth valve fx, sev dil LA, nl RV fxn, mod dil RA.   Chronic lower back pain    Complication of anesthesia    Memory loss  09/2015   Coronary artery disease    a. 2010 Cath: mild-mod CAD per notes.   Depression    GERD (gastroesophageal reflux disease)    Glaucoma    H/O thoracic aortic aneurysm repair    Headache    Heart murmur    History of being hospitalized    memory lose kidney funtion down blood pressure up   History of blood transfusion    "think he had one when he had heart valve OR" (10/14/2016); "none since" (10/19/2017)   History of chicken pox    Hyperlipidemia    Hypertension    Lumbar stenosis    Murmur    Neuropathy    Osteoarthritis    worse in feet and ankles   Permanent atrial fibrillation (HCC)    a. CHA2DS2VASc = 5 -->eliquis   Poor short term memory    takes Aricept   Rheumatoid arthritis (HCC)    "all over" (10/14/2016)   Schizophrenia (HCC)    Seasonal allergies    Thoracic spine pain    Valvular heart disease    Wears glasses    reading   Past Surgical History:  Procedure Laterality Date   AORTIC VALVE REPLACEMENT  2007   [facility].  Supply, [place]; "pig valve"   APPENDECTOMY  2010   BACK SURGERY     CARDIAC VALVE REPLACEMENT     CATARACT EXTRACTION W/ INTRAOCULAR LENS  IMPLANT, BILATERAL Bilateral 2012   COLONOSCOPY WITH PROPOFOL N/A  09/24/2016   Procedure: COLONOSCOPY WITH PROPOFOL;  Surgeon: Wyline Mood, MD;  Location: [facility] ENDOSCOPY;  Service: Endoscopy;  Laterality: N/A;   ESOPHAGOGASTRODUODENOSCOPY (EGD) WITH PROPOFOL N/A 09/24/2016   Procedure: ESOPHAGOGASTRODUODENOSCOPY (EGD) WITH PROPOFOL;  Surgeon: Wyline Mood, MD;  Location: [facility] ENDOSCOPY;  Service: Endoscopy;  Laterality: N/A;   GIVENS CAPSULE STUDY N/A 09/24/2016   Procedure: GIVENS CAPSULE STUDY;  Surgeon: Wyline Mood, MD;  Location: [facility] ENDOSCOPY;  Service: Endoscopy;  Laterality: N/A;   HAMMER TOE SURGERY Right 10/09/2015   Procedure: HAMMER TOE REPAIR WITH K-WIRE FIXATION RIGHT SECOND TOE;  Surgeon: Recardo Evangelist, DPM;  Location: [facility];  Service: Podiatry;  Laterality: Right;   WITH LOCAL   HARDWARE REMOVAL N/A 11/02/2019   Procedure: Removal of hardware thoracic spine;  Surgeon: Donalee Citrin, MD;  Location: [facility] OR;  Service: Neurosurgery;  Laterality: N/A;  posterior   INGUINAL HERNIA REPAIR Right 05/05/2018   Medium Bard PerFix plug.  Surgeon: Earline Mayotte, MD;  Location: [facility] ORS;  Service: General;  Laterality: Right;   IR VERTEBROPLASTY CERV/THOR BX INC UNI/BIL INC/INJECT/IMAGING  10/27/2018   JOINT REPLACEMENT Left    left total knee   LAMINECTOMY WITH POSTERIOR LATERAL ARTHRODESIS LEVEL 3 N/A 09/28/2017   Procedure: LUMBAR  POSTEROR  FUSION REVISION - LUMBAR ONE -TWO, LUMBAR TWO -THREE,THREE-FOUR ,BILATERAL ARTHRODESIS REMOVAL LUMBAR THREE HARDWARE;  Surgeon: Donalee Citrin, MD;  Location: [facility] OR;  Service: Neurosurgery;  Laterality: N/A;   LAMINECTOMY WITH POSTERIOR LATERAL ARTHRODESIS LEVEL 3 N/A 10/20/2017   Procedure: Revision fusion and removal of hardware Lumbar one, Pedicle screw fixation thoracic ten-lumbar two, thoracic nine-ten laminotomy, Posterior Lumbar Arthrodesis thoracic ten-lumbar two ;  Surgeon: Donalee Citrin, MD;  Location: [facility] OR;  Service: Neurosurgery;  Laterality: N/A;   LAPAROSCOPIC CHOLECYSTECTOMY  2010   LUMBAR FUSION Right 06/08/2017   LUMBAR THREE-FOUR, LUMBAR FOUR-FIVE POSTEROLATERAL ARTHRODESIS WITH RIGHT LUMBAR FOUR-FIVE LAMINECTOMY/FORAMINOTOMY   LUMBAR LAMINECTOMY/DECOMPRESSION MICRODISCECTOMY Right 03/08/2016   Procedure: Laminectomy and Foraminotomy - Lumbar Five-Sacral One Right;  Surgeon: Donalee Citrin, MD;  Location: [facility] OR;  Service: Neurosurgery;  Laterality: Right;  Right   MULTIPLE TOOTH EXTRACTIONS     "3"   OTOPLASATY Right 12/15/2021   Procedure: Right auricle repair;  Surgeon: Vernie Murders, MD;  Location: [facility] ORS;  Service: ENT;  Laterality: Right;   POSTERIOR LUMBAR FUSION  10/14/2016   L5-S1   REPLACEMENT TOTAL KNEE Left 2003   THORACIC AORTIC ANEURYSM REPAIR  2010   TOTAL KNEE ARTHROPLASTY Right 08/2021   Social  History:  reports that he quit smoking about 41 years ago. His smoking use included cigarettes. He started smoking about 73 years ago. He has a 32 pack-year smoking history. He has never used smokeless tobacco. He reports that he does not currently use alcohol. He reports that he does not use drugs.  Allergies  Allergen Reactions   Penicillins Hives, Swelling and Other (See Comments)    SWELLING REACTION UNSPECIFIED PATIENT HAS TAKEN AMOXICILLIN ON MED HX FROM [facility] PATIENT HAS HAD A PCN REACTION WITH IMMEDIATE RASH, FACIAL/TONGUE/THROAT SWELLING, SOB, OR LIGHTHEADEDNESS WITH HYPOTENSION:  #  #  #  YES  #  #  #   Has patient had a PCN reaction causing severe rash involving mucus membranes or skin necrosis: No Has patient had a PCN reaction that required hospitalization No Has patient had a PCN reaction occurring within the last 10 years: No   Demerol [Meperidine] Hives and Nausea And Vomiting   Other  Family History  Problem Relation Age of Onset   Heart failure Mother    Hypertension Mother    Asthma Mother    Osteoporosis Mother    Heart attack Father 71       MI   Hypertension Sister    Prostate cancer Neg Hx    Bladder Cancer Neg Hx    Kidney cancer Neg Hx    Colon cancer Neg Hx     Prior to Admission medications   Medication Sig Start Date End Date Taking? Authorizing Provider  albuterol (VENTOLIN HFA) 108 (90 Base) MCG/ACT inhaler Inhale into the lungs. 05/19/22 05/19/23  [provider]  apixaban (ELIQUIS) 5 MG TABS tablet Take 1 tablet (5 mg total) by mouth 2 (two) times daily. 09/22/22   Antonieta Iba, MD  atorvastatin (LIPITOR) 40 MG tablet TAKE 1 TABLET BY MOUTH DAILY 09/22/21   Glori Luis, MD  calcium carbonate (OS-CAL - DOSED IN MG OF ELEMENTAL CALCIUM) 1250 (500 Ca) MG tablet Take 1 tablet by mouth daily with lunch.    [provider]  donepezil (ARICEPT) 10 MG tablet Take 1 tablet by mouth at bedtime. 09/25/21   [provider]   feeding supplement (ENSURE ENLIVE / ENSURE PLUS) LIQD Take 237 mLs by mouth 2 (two) times daily between meals. 03/25/22   Esaw Grandchild A, DO  fenofibrate 160 MG tablet Take 1 tablet (160 mg total) by mouth daily. 05/10/22   Antonieta Iba, MD  ferrous sulfate 325 (65 FE) MG tablet Take 325 mg by mouth daily with breakfast.    [provider]  fexofenadine (ALLEGRA) 30 MG/5ML suspension Take 30 mg by mouth daily. Patient not taking: Reported on 08/11/2022    [provider]  FLUoxetine (PROZAC) 20 MG capsule TAKE 1 CAPSULE BY MOUTH ONCE DAILY 03/01/22   Glori Luis, MD  gabapentin (NEURONTIN) 100 MG capsule Take 1 capsule (100 mg total) by mouth 3 (three) times daily. 03/25/22   Pennie Banter, DO  HYDROcodone-acetaminophen (HYCET) 7.5-325 mg/15 ml solution Take 1 tablet by mouth 4 (four) times daily as needed for moderate pain.    [provider]  hydrocortisone cream 1 % Apply 1 application topically daily as needed for itching.    [provider]  ipratropium (ATROVENT) 0.06 % nasal spray Place 2 sprays into both nostrils 4 (four) times daily. 12/30/21   Glori Luis, MD  lisinopril (ZESTRIL) 20 MG tablet Take 30 mg by mouth daily. 05/28/22   [provider]  loperamide (IMODIUM) 2 MG capsule Take 1 capsule (2 mg total) by mouth as needed for diarrhea or loose stools. 03/25/22   Pennie Banter, DO  loratadine (CLARITIN) 10 MG tablet Take 10 mg by mouth daily.    [provider]  melatonin 5 MG TABS Take 1 tablet (5 mg total) by mouth at bedtime as needed. 03/25/22   Pennie Banter, DO  Multiple Vitamins-Minerals (PRESERVISION AREDS 2) CAPS Take 1 capsule by mouth daily. Patient not taking: Reported on 08/11/2022    [provider]  omeprazole (PRILOSEC) 40 MG capsule TAKE 1 CAPSULE EVERY DAY 09/28/22   Wyline Mood, MD  potassium chloride (KLOR-CON M) 10 MEQ tablet Take 1 tablet (10 mEq total) by mouth daily. On days  that you take lasix 07/16/22 11/13/22  Jonita Albee, PA-C  potassium chloride (KLOR-CON) 10 MEQ tablet Take 10 mEq by mouth daily. 08/10/22   [provider]  torsemide 40  MG TABS Take 40 mg by mouth 2 (two) times daily. 08/11/22 11/09/22  Creig Hines, NP  vitamin B-12 (CYANOCOBALAMIN) 1000 MCG tablet Take 1,000 mcg by mouth daily with lunch.     [provider]  Vitamin D, Ergocalciferol, (DRISDOL) 1.25 MG (50000 UNIT) CAPS capsule Take 1 capsule by mouth on Monday and Thursday. 02/10/22   Glori Luis, MD  vitamin E 180 MG (400 UNITS) capsule Take 800 Units by mouth daily with lunch.    [provider]    Physical Exam: Vitals:   01/25/23 0811 01/25/23 0813 01/25/23 1212  BP:  (!) 146/68 138/62  Pulse:  78 76  Resp:  17 18  Temp:  98.3 F (36.8 C) 98.3 F (36.8 C)  TempSrc:  Oral   SpO2:  96% 96%  Weight: 73 kg    Height: 5\' 11"  (1.803 m)     Physical Exam Constitutional:      Appearance: He is normal weight.  HENT:     Head: Normocephalic.     Comments: + facial bruising      Mouth/Throat:     Mouth: Mucous membranes are moist.  Cardiovascular:     Rate and Rhythm: Normal rate and regular rhythm.  Pulmonary:     Effort: Pulmonary effort is normal.  Musculoskeletal:     Comments: + generalized weakness  + Decreased ROM in LLE   Skin:    General: Skin is warm.  Neurological:     General: No focal deficit present.  Psychiatric:        Mood and Affect: Mood normal.     Data Reviewed:  There are no new results to review at this time. CT Cervical Spine Wo Contrast CLINICAL DATA:  Neck trauma (Age >= 65y)  EXAM: CT CERVICAL SPINE WITHOUT CONTRAST  TECHNIQUE: Multidetector CT imaging of the cervical spine was performed without intravenous contrast. Multiplanar CT image reconstructions were also generated.  RADIATION DOSE REDUCTION: This exam was performed according to the departmental dose-optimization program  which includes automated exposure control, adjustment of the mA and/or kV according to patient size and/or use of iterative reconstruction technique.  COMPARISON:  CT C Spine 12/14/21  FINDINGS: Alignment: Grade 1 anterolisthesis of C3 on C4. Trace retrolisthesis of C4 on C5.  Skull base and vertebrae: Compared to prior exam there is new sclerosis in the C4 vertebral body, which may be secondary to a mild compression deformity.  Soft tissues and spinal canal: No prevertebral fluid or swelling. No visible canal hematoma.  Disc levels:  No evidence of high-grade spinal canal stenosis.  Upper chest: Negative.  Other: None  IMPRESSION: Compared to prior exam there is new sclerosis in the C4 vertebral body, which may be secondary to a mild compression deformity. Recommend correlation with point tenderness and consider further evaluation with a cervical spine MRI.  Electronically Signed   By: Lorenza Cambridge M.D.   On: 01/25/2023 10:22 CT Head Wo Contrast CLINICAL DATA:  Head trauma, minor (Age >= 65y)  EXAM: CT HEAD WITHOUT CONTRAST  TECHNIQUE: Contiguous axial images were obtained from the base of the skull through the vertex without intravenous contrast.  RADIATION DOSE REDUCTION: This exam was performed according to the departmental dose-optimization program which includes automated exposure control, adjustment of the mA and/or kV according to patient size and/or use of iterative reconstruction technique.  COMPARISON:  CT head 03/19/22  FINDINGS: Brain: No evidence of acute infarction, hemorrhage, hydrocephalus, extra-axial collection or mass lesion/mass  effect. Sequela mild chronic microvascular ischemic change  Vascular: No hyperdense vessel or unexpected calcification.  Skull: Normal. Negative for fracture or focal lesion.  Sinuses/Orbits: No middle ear or mastoid effusion. Paranasal sinuses are clear. Bilateral lens replacement. Orbits are  otherwise unremarkable.  Other: None.  IMPRESSION: No acute intracranial abnormality.  Electronically Signed   By: Lorenza Cambridge M.D.   On: 01/25/2023 10:14 DG Knee 2 Views Left CLINICAL DATA:  Fall, left knee pain  EXAM: LEFT KNEE - 1-2 VIEW  COMPARISON:  None Available.  FINDINGS: Surgical changes of prior total knee arthroplasty. No evidence of periprostatic fracture. There is a small suprapatellar knee joint effusion. Small benign appearing cartilaginous lesion in the proximal tibial metadiaphysis likely represents a benign enchondroma.  IMPRESSION: 1. Small suprapatellar knee joint effusion is likely degenerative in nature. 2. Surgical changes of prior total knee arthroplasty without evidence of complication. 3. 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  Electronically Signed   By: Malachy Moan M.D.   On: 01/25/2023 09:37 DG Hip Unilat W or Wo Pelvis 2-3 Views Left CLINICAL DATA:  Fall, left hip pain  EXAM: DG HIP (WITH OR WITHOUT PELVIS) 2-3V LEFT  COMPARISON:  None Available.  FINDINGS: Acute fracture through the greater trochanter of the left femur. The femoral neck appears intact. The femoral head is located. 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 Partially imaged findings of prior left lumbosacral fusion.  IMPRESSION: Acute nondisplaced fracture through the greater trochanter of the left femur most consistent with an avulsion fracture.  The femoral neck appears intact.  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  Electronically Signed   By: Malachy Moan M.D.   On: 01/25/2023 09:35  Lab Results  Component Value Date   WBC 4.4 01/25/2023   HGB 7.4 (L) 01/25/2023   HCT 24.4 (L) 01/25/2023   MCV 111.4 (H) 01/25/2023   PLT 100 (L) 01/25/2023   Last metabolic panel Lab Results  Component Value Date   GLUCOSE 86 01/25/2023   NA 134 (L) 01/25/2023   K  4.3 01/25/2023   CL 104 01/25/2023   CO2 23 01/25/2023   BUN 32 (H) 01/25/2023   CREATININE 1.48 (H) 01/25/2023   GFRNONAA 46 (L) 01/25/2023   CALCIUM 8.5 (L) 01/25/2023   PHOS 4.3 01/12/2018   PROT 7.5 06/11/2022   ALBUMIN 3.9 06/11/2022   LABGLOB 3.0 06/11/2022   AGRATIO 1.4 08/26/2020   BILITOT 0.6 06/11/2022   ALKPHOS 67 06/11/2022   AST 29 06/11/2022   ALT 15 06/11/2022   ANIONGAP 7 01/25/2023    Assessment and Plan: * Hip fracture (HCC) Noted Acute nondisplaced fracture through the greater trochanter of the left femur most consistent with an avulsion fracture on imaging Likely non-operative  Follow up orthopedic recommendations  PT/OT evaluation  Anticipate placement   Chronic diastolic CHF (congestive heart failure) (HCC) 2D ECHO 09/2022 w/ EF 40% 09/2022  Looks euvolemic  Cont home regimen  Monitor   Pancytopenia (HCC) WBC 4.4, hgb 7.4, plt 100 Cell lines stable  No active bleeding  Transfuse for hgb <7   Essential hypertension BP stable  Titrate home regimen   Coronary artery disease Baseline nonobstructive CAD  No active CP  Cont home regimen  Hyperlipidemia Cont statin   Gastroesophageal reflux disease PPI  Chronic kidney disease, stage 3a (HCC) Cr 1.5 GFR in 40s  Monitor   Paroxysmal atrial fibrillation (HCC) Rate controlled at present Continue Eliquis Consider  holding prospectively given overall fall risk   Greater than 50% was spent in counseling and coordination of care with patient Total encounter time 80 minutes or more    Advance Care Planning:   Code Status: Full Code   Consults: Pending ortho evaluation   Family Communication: Sister at the bedside.  Lenis Dickinson ([phone]) who is patient's healthcare power of attorney is highly concerned about overall function decline.  Is highly interested in patient going to either rehab or SNF placement given overall decreased functionality.  Severity of Illness: The appropriate  patient status for this patient is INPATIENT. Inpatient status is judged to be reasonable and necessary in order to provide the required intensity of service to ensure the patient's safety. The patient's presenting symptoms, physical exam findings, and initial radiographic and laboratory data in the context of their chronic comorbidities is felt to place them at high risk for further clinical deterioration. Furthermore, it is not anticipated that the patient will be medically stable for discharge from the hospital within 2 midnights of admission.   * I certify that at the point of admission it is my clinical judgment that the patient will require inpatient hospital care spanning beyond 2 midnights from the point of admission due to high intensity of service, high risk for further deterioration and high frequency of surveillance required.*  Author: Floydene Flock, MD 01/25/2023 1:10 PM  For on call review [email or web address].

## 2023-01-25 NOTE — Assessment & Plan Note (Addendum)
BP stable  --Continue home regimen & titrate as needed

## 2023-01-25 NOTE — Assessment & Plan Note (Addendum)
2D ECHO 09/2022 w/ EF 40% 09/2022  Looks euvolemic  --Cont home regimen  --Monitor  volume status

## 2023-01-25 NOTE — Assessment & Plan Note (Addendum)
Rate controlled  --Continue Eliquis --Continue to weight risks/benefits in follow upgiven fall with fracture

## 2023-01-25 NOTE — ED Provider Notes (Addendum)
[facility] Provider Note    Event Date/Time   First MD Initiated Contact with Patient 01/25/23 [phone]     (approximate)   History   Fall   HPI  Bryan [name] is a 82 y.o. male   Past medical history of atrial fibrillation on Eliquis, CAD, hypertension, chronic lower back pain and rheumatoid arthritis, thoracic aortic aneurysm status post repair, aortic valve replacement, history of lumbar laminectomy lumbar fusion multiple back surgeries who presents emergency department with a mechanical slip and fall last night while at his apartment.  He lives alone.    He was reaching into the fridge for a nice refreshing watermelon in his slippers when his foot slipped and he fell striking his head on the refrigerator and falling onto his left hip.  He was able to call out for his neighbor to call the ambulance, at that time was able to get up and ambulate without minor injuries so declined transport to the hospital.  However upon waking this morning he noted that his left hip was hurting more felt more sore so came to the hospital.  He has otherwise been in his regular state of health denies any other medical complaints aside from chronic unchanged back pain  Independent Historian contributed to assessment above: EMS gives report as above, noting patient has been stable in transport with normal vital signs.    Physical Exam   Triage Vital Signs: ED Triage Vitals  Encounter Vitals Group     BP --      Systolic BP Percentile --      Diastolic BP Percentile --      Pulse --      Resp --      Temp --      Temp src --      SpO2 --      Weight 01/25/23 0811 161 lb (73 kg)     Height 01/25/23 0811 5\' 11"  (1.803 m)     Head Circumference --      Peak Flow --      Pain Score 01/25/23 0807 6     Pain Loc --      Pain Education --      Exclude from Growth Chart --     Most recent vital signs: Vitals:   01/25/23 0813  BP: (!) 146/68  Pulse: 78  Resp: 17   Temp: 98.3 F (36.8 C)  SpO2: 96%    General: Awake, no distress.  CV:  Good peripheral perfusion.  Resp:  Normal effort.  Abd:  No distention.  Other:  Pleasant gentleman in no acute distress.  He has small bruising underneath his left eye which he states is old.  He has no other signs of head trauma and his neck is supple with full range of motion without midline tenderness.  He has no midline tenderness of the thoracic or lumbar spine aside from his chronic unchanged pain, and he has no abdominal tenderness or thoracic tenderness or deformities noted.  He is able to gingerly range at the left hip, and is neurovascular intact to bilateral lower and upper extremities.  Upper extremities appear atraumatic and has full active range of motion.   ED Results / Procedures / Treatments   Labs (all labs ordered are listed, but only abnormal results are displayed) Labs Reviewed  BASIC METABOLIC PANEL - Abnormal; Notable for the following components:      Result Value   Sodium 134 (*)  BUN 32 (*)    Creatinine, Ser 1.48 (*)    Calcium 8.5 (*)    GFR, Estimated 46 (*)    All other components within normal limits  CBC WITH DIFFERENTIAL/PLATELET - Abnormal; Notable for the following components:   RBC 2.19 (*)    Hemoglobin 7.4 (*)    HCT 24.4 (*)    MCV 111.4 (*)    RDW 21.2 (*)    Platelets 100 (*)    All other components within normal limits  PROTIME-INR - Abnormal; Notable for the following components:   Prothrombin Time 30.1 (*)    INR 2.8 (*)    All other components within normal limits  APTT - Abnormal; Notable for the following components:   aPTT 46 (*)    All other components within normal limits    RADIOLOGY I independently reviewed and interpreted x-ray of the knee and I see no obvious fractures or dislocation, hardware intact I also reviewed radiologist's formal read.   PROCEDURES:  Critical Care performed: No  Procedures   MEDICATIONS ORDERED IN  ED: Medications  acetaminophen (TYLENOL) tablet 650 mg (650 mg Oral Given 01/25/23 0837)     IMPRESSION / MDM / ASSESSMENT AND PLAN / ED COURSE  I reviewed the triage vital signs and the nursing notes.                                Patient's presentation is most consistent with acute presentation with potential threat to life or bodily function.  Differential diagnosis includes, but is not limited to, mechanical slip and fall leading to blunt traumatic injury including ICH, C-spine fracture or dislocation, hip fracture dislocation, knee fracture dislocation, ligamentous injury or soft tissue injury   The patient is on the cardiac monitor to evaluate for evidence of arrhythmia and/or significant heart rate changes.  MDM:    Mechanical slip and fall, doubt other medical causes for his fall given very clear story for his slipping and falling.  No other acute medical complaints.  Check traumatic injuries including ICH and this anticoagulated patient with head strike, obtain CT of the head, as well as CT cervical spine for fractures dislocations.  Left hip pain his chief complaint, though he has been ambulatory and weightbearing, has significant pain to that side so I will get an x-ray of the hip as well as the left knee to check for fractures or dislocations.  Given no other acute medical complaints defer lab testing at this time.     --- Imaging reveals a avulsion fracture of the left side of the greater trochanter.  Patient with significant pain, departure from baseline ambulation which was already limited, using cane and walker, lives alone in her apartment, will be admitted for this injury for PT/OT evaluation and rehab placement as needed.  Comfortable while at rest, given Tylenol, will hold on NSAID therapy until the CT of the head is read.   CT head negative.  CT cervical spine with partial deformity, does not correlate with point tenderness, no pain in the neck with full active  range of motion doubt acute traumatic injury C spine.  Labs showed creatinine at baseline and hemoglobin shows anemia in the sevens (from 7.8 and 8.4 at [facility] in late August) --  reviewed medical history patient with chronic iron deficiency anemia with hemoglobins in the sevens last year, previous history of pancytopenia worked up by hematologist, without signs of  active bleeding now, very low suspicion of intra-abdominal infection given benign exam, doubt acute blood loss due to trauma/acute bleeding due to hemodynamically stable.   Admission.        FINAL CLINICAL IMPRESSION(S) / ED DIAGNOSES   Final diagnoses:  Fall, initial encounter  Minor head injury, initial encounter  Hip injury, left, initial encounter  Avulsion fracture of trochanter of femur, left, closed, initial encounter (HCC)     Rx / DC Orders   ED Discharge Orders     None        Note:  This document was prepared using Dragon voice recognition software and may include unintentional dictation errors.    Pilar Jarvis, MD 01/25/23 0865    Pilar Jarvis, MD 01/25/23 1044    Pilar Jarvis, MD 01/25/23 1116    Pilar Jarvis, MD 01/25/23 [phone]

## 2023-01-25 NOTE — Assessment & Plan Note (Signed)
Cont statin

## 2023-01-25 NOTE — Discharge Instructions (Signed)
Fortunately your imaging did not show any emergency injuries like internal bleeding, or broken bones or dislocations as a result of your fall.  Please use her walker and walk carefully at home given your injuries.

## 2023-01-25 NOTE — Assessment & Plan Note (Addendum)
Stable, no active chest pain. Baseline nonobstructive CAD. --Cont home regimen

## 2023-01-25 NOTE — ED Notes (Signed)
Patient transported to X-ray 

## 2023-01-25 NOTE — Progress Notes (Signed)
PT Cancellation Note  Patient Details Name: Bryan [name] MRN: [id number] DOB: 1941/01/24   Cancelled Treatment:    Reason Eval/Treat Not Completed: Other (comment): Orders Received. Chart Reviewed. PT spoke with Medical Team per secure chat, Hospitalist asked to hold therapy today (9/3) and assess/evaluate on 9/4. Will check back at later date/time as medically appropriate.    [name] Fairly, PT, DPT 01/25/23 1:30 PM

## 2023-01-25 NOTE — Assessment & Plan Note (Addendum)
Acute nondisplaced left greater trochanteric fracture, likely avulsion.  --Orthopedic surgery consulted --Non-surgical management --PT/OT evaluation  --SNF placement for rehab --Pain control PRN - stable on home pain medication --WBAT on LLE --Follow up in 1 month for repeat x-rays of left hip

## 2023-01-25 NOTE — ED Notes (Signed)
Patient transported to CT 

## 2023-01-25 NOTE — Consult Note (Signed)
ORTHOPAEDIC CONSULTATION  REQUESTING PHYSICIAN: Floydene Flock, MD  Chief Complaint:   Left hip pain.  History of Present Illness: Bryan [name] is a 82 y.o. male with multiple medical issues including a history of hypertension, valvular heart disease, coronary artery disease, heart failure, chronic atrial fibrillation requiring anticoagulation with Eliquis, rheumatoid arthritis, gastroesophageal reflux disease, and hyperlipidemia who normally lives independently in his apartment.  Apparently, last evening while trying to get a slice of watermelon out of his refrigerator, he slipped on his kitchen floor and fell onto his left side, striking his head on the refrigerator in the process.  His neighbor called the ambulance but by the time the EMS folks arrived, he was able to get himself up and so declined going to the hospital.  However, his left hip pain worsened by this morning, making it difficult for him to ambulate, so he called EMS and was brought to the emergency room for further evaluation and treatment.  X-rays in the emergency room demonstrated a minimally displaced fracture of the left greater trochanter.  The patient has been admitted for evaluation, treatment, pain control, and probable rehab placement.  The patient denies any loss of consciousness when he fell.  He also denies any lightheadedness, dizziness, chest pain, shortness of breath, or other symptoms which may have contributed to his fall.  Past Medical History:  Diagnosis Date   Abnormal CT scan    Asymmetric left rectal wall thickening    Anemia    Anxiety    Basal cell carcinoma 04/28/2020   L forehead - ED&C 06/17/2020   Basal cell carcinoma 05/06/2021   left scalp postauricular ant, EDC   Basal cell carcinoma 05/06/2021   left scalp postauricular post, EDC   Chronic heart failure with preserved ejection fraction (HFpEF) (HCC)    a. 02/2022 Echo: EF  60-65%; b. 04/2022 Echo: EF >55%, nl prosth valve fx, sev dil LA, nl RV fxn, mod dil RA.   Chronic lower back pain    Complication of anesthesia    Memory loss 09/2015   Coronary artery disease    a. 2010 Cath: mild-mod CAD per notes.   Depression    GERD (gastroesophageal reflux disease)    Glaucoma    H/O thoracic aortic aneurysm repair    Headache    Heart murmur    History of being hospitalized    memory lose kidney funtion down blood pressure up   History of blood transfusion    "think he had one when he had heart valve OR" (10/14/2016); "none since" (10/19/2017)   History of chicken pox    Hyperlipidemia    Hypertension    Lumbar stenosis    Murmur    Neuropathy    Osteoarthritis    worse in feet and ankles   Permanent atrial fibrillation (HCC)    a. CHA2DS2VASc = 5 -->eliquis   Poor short term memory    takes Aricept   Rheumatoid arthritis (HCC)    "all over" (10/14/2016)   Schizophrenia (HCC)    Seasonal allergies    Thoracic spine pain    Valvular heart disease    Wears glasses    reading   Past Surgical History:  Procedure Laterality Date   AORTIC VALVE REPLACEMENT  2007   [facility].  Supply, [place]; "pig valve"   APPENDECTOMY  2010   BACK SURGERY     CARDIAC VALVE REPLACEMENT     CATARACT EXTRACTION W/ INTRAOCULAR LENS  IMPLANT, BILATERAL  Bilateral 2012   COLONOSCOPY WITH PROPOFOL N/A 09/24/2016   Procedure: COLONOSCOPY WITH PROPOFOL;  Surgeon: Wyline Mood, MD;  Location: [facility] ENDOSCOPY;  Service: Endoscopy;  Laterality: N/A;   ESOPHAGOGASTRODUODENOSCOPY (EGD) WITH PROPOFOL N/A 09/24/2016   Procedure: ESOPHAGOGASTRODUODENOSCOPY (EGD) WITH PROPOFOL;  Surgeon: Wyline Mood, MD;  Location: [facility] ENDOSCOPY;  Service: Endoscopy;  Laterality: N/A;   GIVENS CAPSULE STUDY N/A 09/24/2016   Procedure: GIVENS CAPSULE STUDY;  Surgeon: Wyline Mood, MD;  Location: [facility] ENDOSCOPY;  Service: Endoscopy;  Laterality: N/A;   HAMMER TOE SURGERY Right 10/09/2015   Procedure:  HAMMER TOE REPAIR WITH K-WIRE FIXATION RIGHT SECOND TOE;  Surgeon: Recardo Evangelist, DPM;  Location: [facility];  Service: Podiatry;  Laterality: Right;  WITH LOCAL   HARDWARE REMOVAL N/A 11/02/2019   Procedure: Removal of hardware thoracic spine;  Surgeon: Donalee Citrin, MD;  Location: [facility] OR;  Service: Neurosurgery;  Laterality: N/A;  posterior   INGUINAL HERNIA REPAIR Right 05/05/2018   Medium Bard PerFix plug.  Surgeon: Earline Mayotte, MD;  Location: [facility] ORS;  Service: General;  Laterality: Right;   IR VERTEBROPLASTY CERV/THOR BX INC UNI/BIL INC/INJECT/IMAGING  10/27/2018   JOINT REPLACEMENT Left    left total knee   LAMINECTOMY WITH POSTERIOR LATERAL ARTHRODESIS LEVEL 3 N/A 09/28/2017   Procedure: LUMBAR  POSTEROR  FUSION REVISION - LUMBAR ONE -TWO, LUMBAR TWO -THREE,THREE-FOUR ,BILATERAL ARTHRODESIS REMOVAL LUMBAR THREE HARDWARE;  Surgeon: Donalee Citrin, MD;  Location: [facility] OR;  Service: Neurosurgery;  Laterality: N/A;   LAMINECTOMY WITH POSTERIOR LATERAL ARTHRODESIS LEVEL 3 N/A 10/20/2017   Procedure: Revision fusion and removal of hardware Lumbar one, Pedicle screw fixation thoracic ten-lumbar two, thoracic nine-ten laminotomy, Posterior Lumbar Arthrodesis thoracic ten-lumbar two ;  Surgeon: Donalee Citrin, MD;  Location: [facility] OR;  Service: Neurosurgery;  Laterality: N/A;   LAPAROSCOPIC CHOLECYSTECTOMY  2010   LUMBAR FUSION Right 06/08/2017   LUMBAR THREE-FOUR, LUMBAR FOUR-FIVE POSTEROLATERAL ARTHRODESIS WITH RIGHT LUMBAR FOUR-FIVE LAMINECTOMY/FORAMINOTOMY   LUMBAR LAMINECTOMY/DECOMPRESSION MICRODISCECTOMY Right 03/08/2016   Procedure: Laminectomy and Foraminotomy - Lumbar Five-Sacral One Right;  Surgeon: Donalee Citrin, MD;  Location: [facility] OR;  Service: Neurosurgery;  Laterality: Right;  Right   MULTIPLE TOOTH EXTRACTIONS     "3"   OTOPLASATY Right 12/15/2021   Procedure: Right auricle repair;  Surgeon: Vernie Murders, MD;  Location: [facility] ORS;  Service: ENT;  Laterality: Right;   POSTERIOR LUMBAR  FUSION  10/14/2016   L5-S1   REPLACEMENT TOTAL KNEE Left 2003   THORACIC AORTIC ANEURYSM REPAIR  2010   TOTAL KNEE ARTHROPLASTY Right 08/2021   Social History   Socioeconomic History   Marital status: Widowed    Spouse name: Not on file   Number of children: Not on file   Years of education: Not on file   Highest education level: Not on file  Occupational History   Occupation: retired  Tobacco Use   Smoking status: Former    Current packs/day: 0.00    Average packs/day: 1 pack/day for 32.0 years (32.0 ttl pk-yrs)    Types: Cigarettes    Start date: 07/05/1949    Quit date: 07/05/1981    Years since quitting: 41.5   Smokeless tobacco: Never  Vaping Use   Vaping status: Never Used  Substance and Sexual Activity   Alcohol use: Not Currently    Comment: "glass of wine/month; if that"   Drug use: Never   Sexual activity: Not Currently  Other Topics Concern   Not on file  Social History Narrative  Not on file   Social Determinants of Health   Financial Resource Strain: Low Risk  (10/13/2022)   [facility]   Overall Financial Resource Strain (CARDIA)    Difficulty of Paying Living Expenses: Not hard at all  Food Insecurity: No Food Insecurity (01/25/2023)   Hunger Vital Sign    Worried About Running Out of Food in the Last Year: Never true    Ran Out of Food in the Last Year: Never true  Transportation Needs: No Transportation Needs (01/25/2023)   PRAPARE - Administrator, Civil Service (Medical): No    Lack of Transportation (Non-Medical): No  Physical Activity: Sufficiently Active (02/22/2022)   Exercise Vital Sign    Days of Exercise per Week: 5 days    Minutes of Exercise per Session: 30 min  Stress: No Stress Concern Present (02/22/2022)   [employer] of Occupational Health - Occupational Stress Questionnaire    Feeling of Stress : Not at all  Social Connections: Unknown (02/22/2022)   Social Connection and Isolation  Panel [NHANES]    Frequency of Communication with Friends and Family: More than three times a week    Frequency of Social Gatherings with Friends and Family: More than three times a week    Attends Religious Services: More than 4 times per year    Active Member of [employer] or Organizations: Yes    Attends Engineer, structural: More than 4 times per year    Marital Status: Not on file   Family History  Problem Relation Age of Onset   Heart failure Mother    Hypertension Mother    Asthma Mother    Osteoporosis Mother    Heart attack Father 22       MI   Hypertension Sister    Prostate cancer Neg Hx    Bladder Cancer Neg Hx    Kidney cancer Neg Hx    Colon cancer Neg Hx    Allergies  Allergen Reactions   Penicillins Hives, Swelling and Other (See Comments)    SWELLING REACTION UNSPECIFIED PATIENT HAS TAKEN AMOXICILLIN ON MED HX FROM [facility] PATIENT HAS HAD A PCN REACTION WITH IMMEDIATE RASH, FACIAL/TONGUE/THROAT SWELLING, SOB, OR LIGHTHEADEDNESS WITH HYPOTENSION:  #  #  #  YES  #  #  #   Has patient had a PCN reaction causing severe rash involving mucus membranes or skin necrosis: No Has patient had a PCN reaction that required hospitalization No Has patient had a PCN reaction occurring within the last 10 years: No   Demerol [Meperidine] Hives and Nausea And Vomiting   Other    Prior to Admission medications   Medication Sig Start Date End Date Taking? Authorizing Provider  albuterol (VENTOLIN HFA) 108 (90 Base) MCG/ACT inhaler Inhale into the lungs. 05/19/22 05/19/23  [provider]  apixaban (ELIQUIS) 5 MG TABS tablet Take 1 tablet (5 mg total) by mouth 2 (two) times daily. 09/22/22   Antonieta Iba, MD  atorvastatin (LIPITOR) 40 MG tablet TAKE 1 TABLET BY MOUTH DAILY 09/22/21   Glori Luis, MD  calcium carbonate (OS-CAL - DOSED IN MG OF ELEMENTAL CALCIUM) 1250 (500 Ca) MG tablet Take 1 tablet by mouth daily with lunch.    [provider]  donepezil  (ARICEPT) 10 MG tablet Take 1 tablet by mouth at bedtime. 09/25/21   [provider]  feeding supplement (ENSURE ENLIVE / ENSURE PLUS) LIQD Take 237 mLs by  mouth 2 (two) times daily between meals. 03/25/22   Esaw Grandchild A, DO  fenofibrate 160 MG tablet Take 1 tablet (160 mg total) by mouth daily. 05/10/22   Antonieta Iba, MD  ferrous sulfate 325 (65 FE) MG tablet Take 325 mg by mouth daily with breakfast.    [provider]  fexofenadine (ALLEGRA) 30 MG/5ML suspension Take 30 mg by mouth daily. Patient not taking: Reported on 08/11/2022    [provider]  FLUoxetine (PROZAC) 20 MG capsule TAKE 1 CAPSULE BY MOUTH ONCE DAILY 03/01/22   Glori Luis, MD  gabapentin (NEURONTIN) 100 MG capsule Take 1 capsule (100 mg total) by mouth 3 (three) times daily. 03/25/22   Pennie Banter, DO  HYDROcodone-acetaminophen (HYCET) 7.5-325 mg/15 ml solution Take 1 tablet by mouth 4 (four) times daily as needed for moderate pain.    [provider]  hydrocortisone cream 1 % Apply 1 application topically daily as needed for itching.    [provider]  ipratropium (ATROVENT) 0.06 % nasal spray Place 2 sprays into both nostrils 4 (four) times daily. 12/30/21   Glori Luis, MD  lisinopril (ZESTRIL) 20 MG tablet Take 30 mg by mouth daily. 05/28/22   [provider]  loperamide (IMODIUM) 2 MG capsule Take 1 capsule (2 mg total) by mouth as needed for diarrhea or loose stools. 03/25/22   Pennie Banter, DO  loratadine (CLARITIN) 10 MG tablet Take 10 mg by mouth daily.    [provider]  melatonin 5 MG TABS Take 1 tablet (5 mg total) by mouth at bedtime as needed. 03/25/22   Pennie Banter, DO  Multiple Vitamins-Minerals (PRESERVISION AREDS 2) CAPS Take 1 capsule by mouth daily. Patient not taking: Reported on 08/11/2022    [provider]  omeprazole (PRILOSEC) 40 MG capsule TAKE 1 CAPSULE EVERY DAY 09/28/22   Wyline Mood, MD  potassium  chloride (KLOR-CON M) 10 MEQ tablet Take 1 tablet (10 mEq total) by mouth daily. On days that you take lasix 07/16/22 11/13/22  Jonita Albee, PA-C  potassium chloride (KLOR-CON) 10 MEQ tablet Take 10 mEq by mouth daily. 08/10/22   [provider]  torsemide 40 MG TABS Take 40 mg by mouth 2 (two) times daily. 08/11/22 11/09/22  Creig Hines, NP  vitamin B-12 (CYANOCOBALAMIN) 1000 MCG tablet Take 1,000 mcg by mouth daily with lunch.     [provider]  Vitamin D, Ergocalciferol, (DRISDOL) 1.25 MG (50000 UNIT) CAPS capsule Take 1 capsule by mouth on Monday and Thursday. 02/10/22   Glori Luis, MD  vitamin E 180 MG (400 UNITS) capsule Take 800 Units by mouth daily with lunch.    [provider]   CT Cervical Spine Wo Contrast  Result Date: 01/25/2023 CLINICAL DATA:  Neck trauma (Age >= 65y) EXAM: CT CERVICAL SPINE WITHOUT CONTRAST TECHNIQUE: Multidetector CT imaging of the cervical spine was performed without intravenous contrast. Multiplanar CT image reconstructions were also generated. RADIATION DOSE REDUCTION: This exam was performed according to the departmental dose-optimization program which includes automated exposure control, adjustment of the mA and/or kV according to patient size and/or use of iterative reconstruction technique. COMPARISON:  CT C Spine 12/14/21 FINDINGS: Alignment: Grade 1 anterolisthesis of C3 on C4. Trace retrolisthesis of C4 on C5. Skull base and vertebrae: Compared to prior exam there is new sclerosis in the C4 vertebral body, which may be secondary to a mild compression deformity. Soft tissues and spinal canal:  No prevertebral fluid or swelling. No visible canal hematoma. Disc levels:  No evidence of high-grade spinal canal stenosis. Upper chest: Negative. Other: None IMPRESSION: Compared to prior exam there is new sclerosis in the C4 vertebral body, which may be secondary to a mild compression deformity. Recommend correlation with  point tenderness and consider further evaluation with a cervical spine MRI. Electronically Signed   By: Lorenza Cambridge M.D.   On: 01/25/2023 10:22   CT Head Wo Contrast  Result Date: 01/25/2023 CLINICAL DATA:  Head trauma, minor (Age >= 65y) EXAM: CT HEAD WITHOUT CONTRAST TECHNIQUE: Contiguous axial images were obtained from the base of the skull through the vertex without intravenous contrast. RADIATION DOSE REDUCTION: This exam was performed according to the departmental dose-optimization program which includes automated exposure control, adjustment of the mA and/or kV according to patient size and/or use of iterative reconstruction technique. COMPARISON:  CT head 03/19/22 FINDINGS: Brain: No evidence of acute infarction, hemorrhage, hydrocephalus, extra-axial collection or mass lesion/mass effect. Sequela mild chronic microvascular ischemic change Vascular: No hyperdense vessel or unexpected calcification. Skull: Normal. Negative for fracture or focal lesion. Sinuses/Orbits: No middle ear or mastoid effusion. Paranasal sinuses are clear. Bilateral lens replacement. Orbits are otherwise unremarkable. Other: None. IMPRESSION: No acute intracranial abnormality. Electronically Signed   By: Lorenza Cambridge M.D.   On: 01/25/2023 10:14   DG Knee 2 Views Left  Result Date: 01/25/2023 CLINICAL DATA:  Fall, left knee pain EXAM: LEFT KNEE - 1-2 VIEW COMPARISON:  None Available. FINDINGS: Surgical changes of prior total knee arthroplasty. No evidence of periprostatic fracture. There is a small suprapatellar knee joint effusion. Small benign appearing cartilaginous lesion in the proximal tibial metadiaphysis likely represents a benign enchondroma. IMPRESSION: 1. Small suprapatellar knee joint effusion is likely degenerative in nature. 2. Surgical changes of prior total knee arthroplasty without evidence of complication. 3. 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 Electronically Signed   By: Malachy Moan M.D.   On: 01/25/2023 09:37   DG Hip Unilat W or Wo Pelvis 2-3 Views Left  Result Date: 01/25/2023 CLINICAL DATA:  Fall, left hip pain EXAM: DG HIP (WITH OR WITHOUT PELVIS) 2-3V LEFT COMPARISON:  None Available. FINDINGS: Acute fracture through the greater trochanter of the left femur. The femoral neck appears intact. The femoral head is located. 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 Partially imaged findings of prior left lumbosacral fusion. IMPRESSION: Acute nondisplaced fracture through the greater trochanter of the left femur most consistent with an avulsion fracture. The femoral neck appears intact. 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 Electronically Signed   By: Malachy Moan M.D.   On: 01/25/2023 09:35    Positive ROS: All other systems have been reviewed and were otherwise negative with the exception of those mentioned in the HPI and as above.  Physical Exam: General:  Alert, no acute distress Psychiatric:  Patient is competent for consent with normal mood and affect   Cardiovascular:  No pedal edema Respiratory:  No wheezing, non-labored breathing GI:  Abdomen is soft and non-tender Skin:  No lesions in the area of chief complaint Neurologic:  Sensation intact distally Lymphatic:  No axillary or cervical lymphadenopathy  Orthopedic Exam:  Orthopedic examination is limited to the left hip and lower extremity.  The left lower extremity is symmetrically aligned in relation to his right lower extremity.  Skin inspection around the left hip is notable for early ecchymosis over the lateral aspect  of the hip, but otherwise is unremarkable.  No swelling, erythema, abrasions, or other skin abnormalities are identified.  He has moderate tenderness to palpation over the lateral aspect of the hip.  He has moderate to severe pain with any attempted active or passive motion of the hip, although he is able to attempt an active  straight leg raise against gravity.  He is grossly neurovascularly intact to the left lower extremity and foot.  X-rays:  Recent x-rays of the pelvis and left hip are available for review and have been reviewed by myself.  The findings are as described above.  There is a minimally displaced fracture through the greater trochanter.  The fracture does not appear to go through the femoral neck or intertrochanteric region.  Mild degenerative changes of the hip joint also were noted.   Assessment: Minimally displaced left greater trochanteric fracture of left hip.  Plan: The treatment options have been discussed in detail with the patient and his family, who are in the room, including both surgical nonsurgical options.  I feel that this injury can be managed nonsurgically.  The patient is quite comfortable with this option.  He may be mobilized with physical therapy, weightbearing as tolerated on the left leg using a walker for balance and support.  He understands that this hip injury will be relatively painful for the first several weeks before the pain gradually diminishes as the fracture heals.  Most likely, he will require rehab placement for a few weeks until he is able to mobilize more independently at home.  He may receive medication for pain as deemed appropriate from medical standpoint.  Thank you for asking me to participate in the care of this most pleasant yet unfortunate man.  I will be happy to follow him with you.   Maryagnes Amos, MD  Beeper #:  [phone]  01/25/2023 12:32 PM

## 2023-01-25 NOTE — Assessment & Plan Note (Addendum)
Cr 1.5 on admission, stable. Cr improved 1.19 >> 1.02 >> 0.92 --Monitor BMP

## 2023-01-25 NOTE — Progress Notes (Signed)
Had discussion earlier in the afternoon with patient's HCPOA Lenis Dickinson at [phone]. Her wishes are for patient to be DNR however, she is asking for patient to clarify. Patient noted to be confused in the setting of baseline dementia Left voicemail for Dondra Spry to follow-up on CODE STATUS in the morning Will otherwise keep full code in the interim

## 2023-01-25 NOTE — Assessment & Plan Note (Signed)
PPI ?

## 2023-01-25 NOTE — ED Triage Notes (Signed)
Patient comes in from home via [facility] with complaints of left hip pain after a fall yesterday evening. According to EMS, the patient slipped and fell, hitting his head and landing on his left hip. The patient uses a cane and a walker to ambulate at baseline. Pt has a history of hypertension and currently taking Eloquis. Pt is alert and oriented x4, and with no signs of distress at this time.

## 2023-01-26 DIAGNOSIS — N1831 Chronic kidney disease, stage 3a: Secondary | ICD-10-CM

## 2023-01-26 DIAGNOSIS — S72002A Fracture of unspecified part of neck of left femur, initial encounter for closed fracture: Secondary | ICD-10-CM

## 2023-01-26 DIAGNOSIS — D61818 Other pancytopenia: Secondary | ICD-10-CM

## 2023-01-26 DIAGNOSIS — I5032 Chronic diastolic (congestive) heart failure: Secondary | ICD-10-CM

## 2023-01-26 LAB — COMPREHENSIVE METABOLIC PANEL
ALT: 17 U/L (ref 0–44)
AST: 23 U/L (ref 15–41)
Albumin: 2.7 g/dL — ABNORMAL LOW (ref 3.5–5.0)
Alkaline Phosphatase: 116 U/L (ref 38–126)
Anion gap: 6 (ref 5–15)
BUN: 28 mg/dL — ABNORMAL HIGH (ref 8–23)
CO2: 21 mmol/L — ABNORMAL LOW (ref 22–32)
Calcium: 7.8 mg/dL — ABNORMAL LOW (ref 8.9–10.3)
Chloride: 105 mmol/L (ref 98–111)
Creatinine, Ser: 1.19 mg/dL (ref 0.61–1.24)
GFR, Estimated: >60 mL/min (ref >60)
Glucose, Bld: 163 mg/dL — ABNORMAL HIGH (ref 70–99)
Potassium: 4.1 mmol/L (ref 3.5–5.1)
Sodium: 132 mmol/L — ABNORMAL LOW (ref 135–145)
Total Bilirubin: 0.5 mg/dL (ref 0.3–1.2)
Total Protein: 6.3 g/dL — ABNORMAL LOW (ref 6.5–8.1)

## 2023-01-26 LAB — CBC
HCT: 21.4 % — ABNORMAL LOW (ref 39.0–52.0)
Hemoglobin: 6.8 g/dL — ABNORMAL LOW (ref 13.0–17.0)
MCH: 34.9 pg — ABNORMAL HIGH (ref 26.0–34.0)
MCHC: 31.8 g/dL (ref 30.0–36.0)
MCV: 109.7 fL — ABNORMAL HIGH (ref 80.0–100.0)
Platelets: 104 10*3/uL — ABNORMAL LOW (ref 150–400)
RBC: 1.95 MIL/uL — ABNORMAL LOW (ref 4.22–5.81)
RDW: 20.6 % — ABNORMAL HIGH (ref 11.5–15.5)
WBC: 5.4 10*3/uL (ref 4.0–10.5)
nRBC: 0 % (ref 0.0–0.2)

## 2023-01-26 LAB — HEMOGLOBIN AND HEMATOCRIT, BLOOD
HCT: 25.1 % — ABNORMAL LOW (ref 39.0–52.0)
Hemoglobin: 7.8 g/dL — ABNORMAL LOW (ref 13.0–17.0)

## 2023-01-26 MED ORDER — SODIUM CHLORIDE 0.9 % IV SOLN
300.0000 mg | Freq: Once | INTRAVENOUS | Status: AC
  Administered 2023-01-26: 300 mg via INTRAVENOUS
  Filled 2023-01-26: qty 300

## 2023-01-26 NOTE — [place] FL2 (Signed)
MEDICAID [id number] LEVEL OF CARE FORM     IDENTIFICATION  Patient Name: Bryan [name] Birthdate: Oct 15, 1940 Sex: male Admission Date (Current Location): 01/25/2023  [facility] and [place] Number:  Chiropodist and Address:  [facility], [address] [place], [place] [address]      Provider Number: [id number]  Attending Physician Name and Address:  Pennie Banter, DO  Relative Name and Phone Number:  Dial SIster [phone]    Current Level of Care: Hospital Recommended Level of Care: Skilled Nursing Facility Prior Approval Number:    Date Approved/Denied:   PASRR Number: [id number]  Discharge Plan: SNF    Current Diagnoses: Patient Active Problem List   Diagnosis Date Noted   Hip fracture (HCC) 01/25/2023   Dyspnea 04/26/2022   Small bowel obstruction (HCC) 04/26/2022   Bilateral lower extremity edema 04/26/2022   Chronic kidney disease, stage 3a (HCC) 04/14/2022   Gastroesophageal reflux disease 04/14/2022   History of gastrointestinal disease 04/14/2022   S/P small bowel resection 04/14/2022   Protein-calorie malnutrition, severe 03/23/2022   Acute encephalopathy 03/19/2022   Leukocytosis 03/19/2022   Open scalp wound 02/02/2022   Senile dementia uncomp, without behavioral disturbance (HCC) 01/11/2022   Scalp hematoma 01/10/2022   Acute blood loss anemia 01/10/2022   Coronary artery disease    Chronic kidney disease (CKD), stage II (mild)    Chronic diastolic CHF (congestive heart failure) (HCC)    Depression with anxiety    Hematoma 12/31/2021   Rhinorrhea 12/31/2021   Acute bilateral back pain 12/21/2021   Fall 12/15/2021   Complex laceration of right ear 12/15/2021   Depression    Hyperkalemia    Status post knee replacement 09/29/2021   Leg wound, right, initial encounter 08/21/2021   Uncomplicated opioid dependence (HCC) 05/27/2021   Protein-calorie malnutrition (HCC) 08/22/2020   Right knee pain  05/26/2020   Basal cell carcinoma 05/26/2020   Skin lesion 02/22/2020   Acute on chronic congestive heart failure (HCC) 01/21/2020   Neuropathy 11/21/2019   Acute heart failure with preserved ejection fraction (HFpEF) (HCC)    Pulmonary HTN (HCC)    Painful orthopaedic hardware (HCC) 11/02/2019   Osteoporosis 01/26/2019   Right inguinal hernia 04/18/2018   Zinc deficiency 01/27/2018   Abnormal CT scan of lung 12/06/2017   Low back pain 12/05/2017   Anemia    AKI (acute kidney injury) (HCC)    Pseudoarthrosis of lumbar spine 09/28/2017   Anxiety 06/24/2017   Lumbar burst fracture, closed, initial encounter (HCC) 06/08/2017   Urge incontinence 12/15/2016   Insomnia 11/09/2016   Nausea and vomiting 10/15/2016   Spinal stenosis of lumbar region 10/14/2016   Leukopenia 09/27/2016   Thrombocytopenia (HCC) 09/27/2016   Pancytopenia (HCC) 09/27/2016   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   HNP (herniated nucleus pulposus with myelopathy), thoracic 03/08/2016   Rectal nodule 02/23/2016   Claudication (HCC) 01/13/2016   DDD (degenerative disc disease), lumbar 01/02/2016   Lumbar radiculopathy 01/02/2016   Facet syndrome, lumbar 01/02/2016   IBS (irritable bowel syndrome) 07/15/2015   Erectile dysfunction of organic origin 07/15/2015   Carotid stenosis 05/06/2015   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   Chronic kidney disease 02/17/2015   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   Enlarged prostate with lower urinary tract symptoms (LUTS) 02/17/2015   Rheumatoid arthritis (HCC) 10/01/2014   Sacroiliac joint disease 10/01/2014   Fusion of spine, sacral and sacrococcygeal region 10/01/2014   DDD (degenerative  disc disease), cervical 10/01/2014   Iron deficiency anemia 06/19/2014   Aneurysm, ascending aorta (HCC) 05/27/2014   Aneurysm of thoracic aorta (HCC) 05/27/2014   Thoracic aortic aneurysm without rupture (HCC) 05/27/2014   Cognitive  decline 04/01/2014   Spinal stenosis of lumbar region with radiculopathy 01/30/2014   Vitamin D deficiency 01/30/2014   Lumbar canal stenosis 01/30/2014   Paroxysmal atrial fibrillation (HCC) 11/27/2013   Hyperlipidemia 11/27/2013   Essential hypertension 11/27/2013   Bicuspid aortic valve 11/27/2013   Atherosclerotic heart disease of native coronary artery without angina pectoris 11/27/2013   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   Carotid artery insufficiency syndrome 11/06/2010    Orientation RESPIRATION BLADDER Height & Weight     Self, Time, Situation, Place  Normal Continent Weight: 73 kg Height:  5\' 11"  (180.3 cm)  BEHAVIORAL SYMPTOMS/MOOD NEUROLOGICAL BOWEL NUTRITION STATUS      Continent Diet  AMBULATORY STATUS COMMUNICATION OF NEEDS Skin   Extensive Assist Verbally Normal, Surgical wounds                       Personal Care Assistance Level of Assistance  Bathing, Feeding, Dressing Bathing Assistance: Limited assistance Feeding assistance: Independent Dressing Assistance: Limited assistance     Functional Limitations Info  Sight, Hearing, Speech Sight Info: Adequate Hearing Info: Adequate Speech Info: Adequate    SPECIAL CARE FACTORS FREQUENCY  PT (By licensed PT), OT (By licensed OT)     PT Frequency: 5 times per week OT Frequency: 5 times per week            Contractures      Additional Factors Info  Code Status, Allergies Code Status Info: Full code Allergies Info: Penicillins, Demerol (Meperidine), Other           Current Medications (01/26/2023):  This is the current hospital active medication list Current Facility-Administered Medications  Medication Dose Route Frequency Provider Last Rate Last Admin   albuterol (PROVENTIL) (2.5 MG/3ML) 0.083% nebulizer solution 3 mL  3 mL Inhalation Q4H PRN Floydene Flock, MD   3 mL at 01/25/23 2044   apixaban (ELIQUIS) tablet 5 mg  5 mg Oral BID Floydene Flock, MD   5 mg at 01/26/23  0981   atorvastatin (LIPITOR) tablet 40 mg  40 mg Oral Daily Floydene Flock, MD   40 mg at 01/26/23 0928   donepezil (ARICEPT) tablet 10 mg  10 mg Oral QHS Floydene Flock, MD   10 mg at 01/25/23 2132   FLUoxetine (PROZAC) capsule 20 mg  20 mg Oral Daily Floydene Flock, MD   20 mg at 01/26/23 1914   gabapentin (NEURONTIN) capsule 100 mg  100 mg Oral TID Floydene Flock, MD   100 mg at 01/26/23 0928   HYDROcodone-acetaminophen (NORCO) 7.5-325 MG per tablet 1 tablet  1 tablet Oral Q4H PRN Floydene Flock, MD   1 tablet at 01/26/23 1213   influenza vaccine adjuvanted (FLUAD) injection 0.5 mL  0.5 mL Intramuscular Tomorrow-1000 Floydene Flock, MD       lisinopril (ZESTRIL) tablet 10 mg  10 mg Oral Daily Floydene Flock, MD   10 mg at 01/26/23 0928   ondansetron (ZOFRAN) tablet 4 mg  4 mg Oral Q6H PRN Floydene Flock, MD       Or   ondansetron [facility]) injection 4 mg  4 mg Intravenous Q6H PRN Floydene Flock, MD       pantoprazole (PROTONIX)  EC tablet 40 mg  40 mg Oral Daily Floydene Flock, MD   40 mg at 01/26/23 1610   potassium chloride SA (KLOR-CON M) CR tablet 10 mEq  10 mEq Oral Daily Floydene Flock, MD   10 mEq at 01/26/23 9604   torsemide (DEMADEX) tablet 40 mg  40 mg Oral BID Floydene Flock, MD       Facility-Administered Medications Ordered in Other Encounters  Medication Dose Route Frequency Provider Last Rate Last Admin   0.9 %  sodium chloride infusion   Intravenous Continuous Rosey Bath, MD 10 mL/hr at 12/11/19 1123 New Bag at 12/11/19 1123     Discharge Medications: Please see discharge summary for a list of discharge medications.  Relevant Imaging Results:  Relevant Lab Results:   Additional Information [id number]  Marlowe Sax, RN

## 2023-01-26 NOTE — Progress Notes (Signed)
Progress Note   Patient: Bryan [name]:[id number] DOB: 1940-10-20 DOA: 01/25/2023     1 DOS: the patient was seen and examined on 01/26/2023   Brief hospital course: HPI on admission 01/25/23: "Bryan [name] is a 82 y.o. male with medical history significant of HTN, HLD, HFrEF (LVEF 40% 09/2022), A-fib (on Eliquis), centrilobular emphysema, bicuspid AV s/p bioprosthetic aortic valve (2010), CKD stage III, rheumatoid arthritis, iron deficiency anemia, BPH, GERD, prior SBO s/p hemicolectomy and ileal resection (2023) presenting with fall and left hip fracture.  Patient noted to have been recently admitted in the [facility] system August 22 through August 28 for acute HFrEF.  History primarily from patient's sister in the setting of baseline dementia.  Per report, patient with overall progressive decline over several months.  Currently lives at home alone.  Per report, had a mechanical fall where patient hit his head and landed on his left side.  EMS transport was initially refused.  However, patient woke up with the inability to ambulate on the left side.  Family unclear if patient is been taking his medications as prescribed. "  Patient was admitted to hospitalist service with Orthopedic surgery consulted.  Hip fracture is non-surgical, being managed conservatively.  Pt was cleared to work with PT/OT with weight-bearing as tolerated on left leg with walker.  Further hospital course and management as outlined below.   Assessment and Plan:  * Hip fracture (HCC) Acute nondisplaced left greater trochanteric fracture, likely avulsion.  --Orthopedic surgery consulted --Non-surgical management --PT/OT evaluation  --Will need SNF placement  --Pain control PRN --WBAT on LLE  Chronic diastolic CHF (congestive heart failure) (HCC) 2D ECHO 09/2022 w/ EF 40% 09/2022  Looks euvolemic  --Cont home regimen  --Monitor  volume status  Pancytopenia (HCC) WBC 4.4, hgb 7.4, plt 100 Cell lines stable.   No active bleeding  --Monitor CBC --Transfuse for hgb <7   Essential hypertension BP stable  --Continue home regimen & titrate as needed  Coronary artery disease Stable, no active chest pain. Baseline nonobstructive CAD. --Cont home regimen  Hyperlipidemia Cont statin   Gastroesophageal reflux disease PPI  Chronic kidney disease, stage 3a (HCC) Cr 1.5 on admission, stable. --Monitor BMP  Paroxysmal atrial fibrillation (HCC) Rate controlled  --Continue Eliquis --Continue to weight risks/benefits in follow upgiven fall with fracture        Subjective: Pt awake resting in bed this AM. He complains of back pain, has had several back surgeries in past.  Hip is a little sore, but not severe at this time.  He denies other complaints.   Physical Exam: Vitals:   01/25/23 2335 01/26/23 0445 01/26/23 0857 01/26/23 1523  BP: (!) 140/84 130/68 121/72 (!) 119/58  Pulse: 80 77 80 65  Resp: 17 18 18 18   Temp: 98.2 F (36.8 C) 97.8 F (36.6 C) 98.1 F (36.7 C) 98.4 F (36.9 C)  TempSrc: Oral Oral    SpO2: 97% 96% 95% 97%  Weight:      Height:       General exam: awake, alert, no acute distress HEENT: moist mucus membranes, hearing grossly normal  Respiratory system: CTAB, no wheezes, rales or rhonchi, normal respiratory effort. Cardiovascular system: normal S1/S2, RRR, no pedal edema.   Gastrointestinal system: soft, NT, ND, no HSM felt, +bowel sounds. Central nervous system: A&O x 3. no gross focal neurologic deficits, normal speech Extremities: moves all, no edema, normal tone Skin: dry, intact, normal temperature Psychiatry: normal mood, congruent affect, judgement and  insight appear normal   Data Reviewed:  Notable labs ---  Cr 1.48 >> 1.19 Bicarb 21, glucose 163, BUN 28, Ca 7.8, albumin 2.7, Hbg 6.8 >> 7.8 afternoon repeat Platelets 100 >> 104k   Family Communication: None present, will attempt to call  Disposition: Status is: Inpatient Remains inpatient  appropriate because: needs SNF placement, ongoing evaluation   Planned Discharge Destination: Skilled nursing facility    Time spent: 40 minutes  Author: Pennie Banter, DO 01/26/2023 3:51 PM  For on call review [email or web address].

## 2023-01-26 NOTE — Evaluation (Addendum)
Physical Therapy Evaluation Patient Details Name: ELISE LORIS MRN: [id number] DOB: 06-Feb-1941 Today's Date: 01/26/2023  History of Present Illness  Pt is an 82 y/o M admitted on 01/25/23 after presenting with c/o a fall & L hip fx. Pt found to have acute nondisplaced fracture through the greater trochanter of the left femur most consistent with an avulsion fracture on imaging. Ortho recommends conservative management. Of note, pt with recent admission to [facility] 01/13/23-01/19/23 for acute HFrEF. PMH: HTN, HLD, HFrEF, a-fib, centrilobular emphysema, bicuspid AV s/p bioprosthetic aortic valve, CKD 3, RA, iron deficiency anemia, BPH, GERD, SBO s/p hemicolectomy & ileal resection, dementia  Clinical Impression  Pt seen for PT evaluation with co-tx with OT. Pt is pleasant & motivated to participate, AxOx4. Pt requires min assist for bed mobility, min assist +2 for STS from elevated EOB & min assist +2 for step pivot bed>recliner with RW. Pt with inability to step & instead slides feet across floor.  Pt with poor eccentric control with stand>sit as BLE slides forwards out in front of her but pt did attempt to slowly lower himself with BUE on armrests. Will continue to follow pt acutely to progress mobility as able.  Addendum: MD cleared pt for participation in therapy despite low Hgb.       If plan is discharge home, recommend the following: A lot of help with walking and/or transfers;A lot of help with bathing/dressing/bathroom;Assistance with cooking/housework;Direct supervision/assist for financial management;Assist for transportation;Direct supervision/assist for medications management;Help with stairs or ramp for entrance   Can travel by private vehicle   No    Equipment Recommendations None recommended by PT (defer to next venue)  Recommendations for Other Services       Functional Status Assessment Patient has had a recent decline in their functional status and demonstrates the ability to  make significant improvements in function in a reasonable and predictable amount of time.     Precautions / Restrictions Precautions Precautions: Fall Restrictions Weight Bearing Restrictions: Yes LLE Weight Bearing: Weight bearing as tolerated      Mobility  Bed Mobility Overal bed mobility: Needs Assistance Bed Mobility: Supine to Sit     Supine to sit: Min assist, HOB elevated, Used rails     General bed mobility comments: assistance to move LLE to EOB    Transfers Overall transfer level: Needs assistance Equipment used: Rolling walker (2 wheels) Transfers: Sit to/from Stand, Bed to chair/wheelchair/BSC Sit to Stand: Min assist, +2 physical assistance, From elevated surface   Step pivot transfers: Min assist, +2 physical assistance (extra time to pivot to recliner, pt slides feet vs stepping, 1 standing break during transfer 2/2 pain)       General transfer comment: STS from EOB    Ambulation/Gait                  Stairs            Wheelchair Mobility     Tilt Bed    Modified Rankin (Stroke Patients Only)       Balance Overall balance assessment: Needs assistance Sitting-balance support: Feet supported Sitting balance-Leahy Scale: Good     Standing balance support: During functional activity, Bilateral upper extremity supported, Reliant on assistive device for balance Standing balance-Leahy Scale: Poor                               Pertinent Vitals/Pain Pain Assessment Pain Assessment: Faces Faces  Pain Scale: Hurts whole lot Pain Location: L hip Pain Descriptors / Indicators: Discomfort, Grimacing, Guarding Pain Intervention(s): Monitored during session, Limited activity within patient's tolerance, Repositioned    Home Living Family/patient expects to be discharged to:: Private residence Living Arrangements: Alone Available Help at Discharge: Family;Available PRN/intermittently   Home Access: Level entry            Additional Comments: Pt reports sisters come by a couple times a week.    Prior Function               Mobility Comments: Pt reports he's ambulatory with rollator       Extremity/Trunk Assessment   Upper Extremity Assessment Upper Extremity Assessment: Generalized weakness    Lower Extremity Assessment Lower Extremity Assessment: Generalized weakness;LLE deficits/detail (redness noted on distal RLE) LLE Deficits / Details: limited by pain, wound on bottom of L foot       Communication      Cognition Arousal: Alert Behavior During Therapy: WFL for tasks assessed/performed Overall Cognitive Status: Within Functional Limits for tasks assessed                                 General Comments: AxOx4, pleasant, willing to participate.        General Comments General comments (skin integrity, edema, etc.): Pt on room air, SPO2 as low as 89% sitting EOB, increases to 93% with cuing for pursed lip breathing, pt reports slight SOB/lightheadedness initially upon sitting    Exercises     Assessment/Plan    PT Assessment Patient needs continued PT services  PT Problem List Decreased strength;Pain;Cardiopulmonary status limiting activity;Decreased range of motion;Decreased safety awareness;Decreased mobility;Decreased balance;Decreased knowledge of precautions;Decreased activity tolerance;Decreased knowledge of use of DME;Decreased cognition       PT Treatment Interventions DME instruction;Balance training;Gait training;Neuromuscular re-education;Stair training;Therapeutic activities;Therapeutic exercise;Manual techniques;Functional mobility training;Patient/family education    PT Goals (Current goals can be found in the Care Plan section)  Acute Rehab PT Goals Patient Stated Goal: decreased pain, get better PT Goal Formulation: With patient Time For Goal Achievement: 02/09/23 Potential to Achieve Goals: Good    Frequency Min 1X/week      Co-evaluation PT/OT/SLP Co-Evaluation/Treatment: Yes Reason for Co-Treatment: Necessary to address cognition/behavior during functional activity;To address functional/ADL transfers PT goals addressed during session: Mobility/safety with mobility         AM-PAC PT "6 Clicks" Mobility  Outcome Measure Help needed turning from your back to your side while in a flat bed without using bedrails?: A Little Help needed moving from lying on your back to sitting on the side of a flat bed without using bedrails?: A Little Help needed moving to and from a bed to a chair (including a wheelchair)?: A Little Help needed standing up from a chair using your arms (e.g., wheelchair or bedside chair)?: A Little Help needed to walk in hospital room?: A Lot Help needed climbing 3-5 steps with a railing? : Total 6 Click Score: 15    End of Session Equipment Utilized During Treatment: Gait belt Activity Tolerance: Patient limited by pain Patient left: in chair;with chair alarm set;with call bell/phone within reach Nurse Communication: Mobility status PT Visit Diagnosis: Muscle weakness (generalized) (M62.81);Pain;Unsteadiness on feet (R26.81);Other abnormalities of gait and mobility (R26.89);Difficulty in walking, not elsewhere classified (R26.2) Pain - Right/Left: Left Pain - part of body: Hip    Time: 4782-9562 PT Time Calculation (min) (ACUTE ONLY): 20  min   Charges:   PT Evaluation $PT Eval Low Complexity: 1 Low   PT General Charges $$ ACUTE PT VISIT: 1 Visit         Aleda Grana, PT, DPT 01/26/23, 12:07 PM   Sandi Mariscal 01/26/2023, 12:07 PM

## 2023-01-26 NOTE — Plan of Care (Signed)

## 2023-01-26 NOTE — TOC Progression Note (Signed)
Transition of Care [facility]) - Progression Note    Patient Details  Name: Bryan [name] MRN: [id number] Date of Birth: 1941/03/07  Transition of [facility]) CM/SW Contact  Marlowe Sax, RN Phone Number: 01/26/2023, 1:56 PM  Clinical Narrative:    Met with the patient in the room He lives home alone and is agreeable to a bedsearch to go to [facility], bedsearch sent ins auth needed for approval   Expected Discharge Plan: Skilled Nursing Facility Barriers to Discharge: SNF Pending bed offer, Insurance Authorization  Expected Discharge Plan and Services   Discharge Planning Services: CM Consult   Living arrangements for the past 2 months: Single Family Home                                       Social Determinants of Health (SDOH) Interventions SDOH Screenings   Food Insecurity: No Food Insecurity (01/25/2023)  Housing: Low Risk  (01/25/2023)  Transportation Needs: No Transportation Needs (01/25/2023)  Utilities: Not At Risk (01/25/2023)  Depression (PHQ2-9): Low Risk  (04/26/2022)  Financial Resource Strain: Low Risk  (10/13/2022)   Received from [facility], [facility]  Physical Activity: Sufficiently Active (02/22/2022)  Social Connections: Unknown (02/22/2022)  Stress: No Stress Concern Present (02/22/2022)  Tobacco Use: Medium Risk (01/25/2023)    Readmission Risk Interventions     No data to display

## 2023-01-26 NOTE — Hospital Course (Addendum)
HPI on admission 01/25/23: "RUBY WHEELHOUSE is a 82 y.o. male with medical history significant of HTN, HLD, HFrEF (LVEF 40% 09/2022), A-fib (on Eliquis), centrilobular emphysema, bicuspid AV s/p bioprosthetic aortic valve (2010), CKD stage III, rheumatoid arthritis, iron deficiency anemia, BPH, GERD, prior SBO s/p hemicolectomy and ileal resection (2023) presenting with fall and left hip fracture.  Patient noted to have been recently admitted in the [facility] system August 22 through August 28 for acute HFrEF.  History primarily from patient's sister in the setting of baseline dementia.  Per report, patient with overall progressive decline over several months.  Currently lives at home alone.  Per report, had a mechanical fall where patient hit his head and landed on his left side.  EMS transport was initially refused.  However, patient woke up with the inability to ambulate on the left side.  Family unclear if patient is been taking his medications as prescribed. "  Patient was admitted to hospitalist service with Orthopedic surgery consulted.  Hip fracture is non-surgical, being managed conservatively.  Pt was cleared to work with PT/OT with weight-bearing as tolerated on left leg with walker. SNF recommended for short-term rehab. TOC working on placement.  Patient has chronic iron deficiency anemia with borderline Hbg intermittently below 7.  Transfused 1 unit pRBC's and given iron infusion.  Further hospital course and management as outlined below.

## 2023-01-26 NOTE — Evaluation (Signed)
Occupational Therapy Evaluation Patient Details Name: Bryan [name] MRN: [id number] DOB: 05-08-1941 Today's Date: 01/26/2023   History of Present Illness Pt is an 82 y/o M admitted on 01/25/23 after presenting with c/o a fall & L hip fx. Pt found to have acute nondisplaced fracture through the greater trochanter of the left femur most consistent with an avulsion fracture on imaging. Ortho recommends conservative management. Of note, pt with recent admission to [facility] 01/13/23-01/19/23 for acute HFrEF. PMH: HTN, HLD, HFrEF, a-fib, centrilobular emphysema, bicuspid AV s/p bioprosthetic aortic valve, CKD 3, RA, iron deficiency anemia, BPH, GERD, SBO s/p hemicolectomy & ileal resection, dementia   Clinical Impression   Pt seen this date for OT evaluation.  Sleeping upon arrival but easily awakened and agreeable to participate.  Pt presents with functional decline in ADLs and mobility related to recent fall with L hip fx.  Currently min A and extra time for bed mobility, sit to stand, and stand pivot transfers using RW.  Pt currently limited by pain, resulting in limited weight shifting during transfers, and instead shuffling feet in his slippers to transfer bed to chair.  Per pt, he is a retired Teacher, early years/pre and states that he is well informed about his medications, stating that he sets them up himself in a weekly pill box.  Per chart, sister is unsure if he had been missing medications, with chart stating baseline dementia and another recent hospitalization for HF.  Pt lives alone in handicapped accessible [place].  Sisters stop in weekly to provide assist with IADLs.  Pt will benefit from SNF upon d/c in order to maximize safety and functional indep with ADLs and mobility.    If plan is discharge home, recommend the following: A lot of help with bathing/dressing/bathroom;Assistance with cooking/housework;Direct supervision/assist for medications management;Assist for transportation;A lot  of help with walking and/or transfers;Help with stairs or ramp for entrance    Functional Status Assessment  Patient has had a recent decline in their functional status and demonstrates the ability to make significant improvements in function in a reasonable and predictable amount of time.  Equipment Recommendations  Other (comment) (defer to next venue of care)    Recommendations for Other Services       Precautions / Restrictions Precautions Precautions: Fall Restrictions Weight Bearing Restrictions: Yes LLE Weight Bearing: Weight bearing as tolerated      Mobility Bed Mobility Overal bed mobility: Needs Assistance Bed Mobility: Supine to Sit     Supine to sit: Min assist, HOB elevated, Used rails     General bed mobility comments: assistance to move LLE to EOB; extra time to transition trunk with heavy UE support Patient Response: Cooperative  Transfers Overall transfer level: Needs assistance Equipment used: Rolling walker (2 wheels) Transfers: Sit to/from Stand, Bed to chair/wheelchair/BSC Sit to Stand: Min assist, +2 physical assistance, From elevated surface     Step pivot transfers: Min assist, +2 physical assistance     General transfer comment: Heavy UE support into walker d/t LLE pain during transfer.      Balance Overall balance assessment: Needs assistance Sitting-balance support: Feet supported Sitting balance-Leahy Scale: Good Sitting balance - Comments: limited reach outside BOS d/t pain in L hip   Standing balance support: During functional activity, Bilateral upper extremity supported, Reliant on assistive device for balance Standing balance-Leahy Scale: Poor  ADL either performed or assessed with clinical judgement   ADL Overall ADL's : Needs assistance/impaired     Grooming: Set up;Sitting Grooming Details (indicate cue type and reason): anticipate no problems while seated; pain would limit standing  for these tasks at time of eval                             Functional mobility during ADLs: Minimal assistance;+2 for physical assistance;Rolling walker (2 wheels) General ADL Comments: Max A for LB ADLs d/t limited reach with L hip pain     Vision Patient Visual Report: No change from baseline                         Pertinent Vitals/Pain Pain Assessment Pain Assessment: Faces Faces Pain Scale: Hurts whole lot Pain Location: L hip Pain Descriptors / Indicators: Discomfort, Grimacing, Guarding Pain Intervention(s): Monitored during session, Limited activity within patient's tolerance, Repositioned     Extremity/Trunk Assessment Upper Extremity Assessment Upper Extremity Assessment: Generalized weakness   Lower Extremity Assessment Lower Extremity Assessment: Defer to PT evaluation;LLE deficits/detail;Generalized weakness LLE Deficits / Details: limited by pain, wound on bottom of L foot       Communication Communication Communication: Other (comment) (requires redirection d/t being very talkative) Cueing Techniques: Verbal cues   Cognition Arousal: Alert Behavior During Therapy: WFL for tasks assessed/performed Overall Cognitive Status: Within Functional Limits for tasks assessed                                 General Comments: AxOx4, pleasant, cooperative     General Comments  sp02 sats ranged from 89-92% on room air during activity.  Cues provided for pursed lip breathing.    Exercises Other Exercises Other Exercises: Instructed pt in use of incentive spirometer; min vc for technique.  Encouraged 10 reps hourly when awake.         Home Living Family/patient expects to be discharged to:: Private residence Living Arrangements: Alone Available Help at Discharge: Family;Available PRN/intermittently Type of Home: Apartment Home Access: Level entry     Home Layout: One level     Bathroom Shower/Tub: Company secretary: Handicapped height Bathroom Accessibility: Yes   Home Equipment: Shower seat - built in;Grab bars - tub/shower;Grab bars - toilet;BSC/3in1   Additional Comments: Pt reports sisters come by a couple times a week.      Prior Functioning/Environment               Mobility Comments: Pt reports he's ambulatory with rollator; has not driven for a couple of years, per pt ADLs Comments: modified indep with basic self care; sisters deliver groceries and bring meals often.  Pt reports he has assist with laundry on occasion and he recently hired someone to help with cleaning.        OT Problem List: Decreased strength;Decreased activity tolerance;Decreased knowledge of use of DME or AE;Impaired balance (sitting and/or standing);Decreased safety awareness;Pain      OT Treatment/Interventions: Self-care/ADL training;Energy conservation;Balance training;Therapeutic exercise;DME and/or AE instruction;Therapeutic activities;Patient/family education    OT Goals(Current goals can be found in the care plan section) Acute Rehab OT Goals Patient Stated Goal: Decrease pain and improve mobility OT Goal Formulation: With patient Time For Goal Achievement: 02/09/23 Potential to Achieve Goals: Good ADL Goals Pt Will Perform Lower Body Dressing: with  mod assist;sit to/from stand Pt Will Transfer to Toilet: with min assist;bedside commode Pt Will Perform Toileting - Clothing Manipulation and hygiene: with min assist;sit to/from stand;with adaptive equipment  OT Frequency: Min 1X/week    Co-evaluation PT/OT/SLP Co-Evaluation/Treatment: Yes Reason for Co-Treatment: Necessary to address cognition/behavior during functional activity;To address functional/ADL transfers PT goals addressed during session: Mobility/safety with mobility OT goals addressed during session: Proper use of Adaptive equipment and DME;ADL's and self-care      AM-PAC OT "6 Clicks" Daily Activity     Outcome  Measure Help from another person eating meals?: None Help from another person taking care of personal grooming?: A Little Help from another person toileting, which includes using toliet, bedpan, or urinal?: A Lot Help from another person bathing (including washing, rinsing, drying)?: A Lot Help from another person to put on and taking off regular upper body clothing?: A Little Help from another person to put on and taking off regular lower body clothing?: A Lot 6 Click Score: 16   End of Session Equipment Utilized During Treatment: Gait belt;Rolling walker (2 wheels) Nurse Communication: Mobility status  Activity Tolerance: Patient tolerated treatment well Patient left: in chair;with call bell/phone within reach;with chair alarm set  OT Visit Diagnosis: Unsteadiness on feet (R26.81);History of falling (Z91.81);Pain;Muscle weakness (generalized) (M62.81) Pain - Right/Left: Left Pain - part of body: Hip                Time: 4098-1191 OT Time Calculation (min): 20 min Charges:  OT General Charges $OT Visit: 1 Visit OT Evaluation $OT Eval Moderate Complexity: 1 Mod  Danelle Earthly, MS, OTR/L   Otis Dials 01/26/2023, 1:35 PM

## 2023-01-26 NOTE — Progress Notes (Signed)
1706 Dr Denton Lank made aware that pt is refusing his PO torsemide at this time. Pt voiding sufficient amounts. Will continue to monitor.

## 2023-01-26 NOTE — Plan of Care (Signed)
  Problem: Clinical Measurements: Goal: Ability to maintain clinical measurements within normal limits will improve Outcome: Progressing   Problem: Clinical Measurements: Goal: Will remain free from infection Outcome: Progressing   Problem: Education: Goal: Knowledge of General Education information will improve Description: Including pain rating scale, medication(s)/side effects and non-pharmacologic comfort measures Outcome: Progressing   Problem: Activity: Goal: Risk for activity intolerance will decrease Outcome: Progressing

## 2023-01-27 DIAGNOSIS — I480 Paroxysmal atrial fibrillation: Secondary | ICD-10-CM

## 2023-01-27 DIAGNOSIS — N1831 Chronic kidney disease, stage 3a: Secondary | ICD-10-CM | POA: Diagnosis not present

## 2023-01-27 DIAGNOSIS — I5032 Chronic diastolic (congestive) heart failure: Secondary | ICD-10-CM | POA: Diagnosis not present

## 2023-01-27 DIAGNOSIS — D61818 Other pancytopenia: Secondary | ICD-10-CM | POA: Diagnosis not present

## 2023-01-27 LAB — CBC
HCT: 22.7 % — ABNORMAL LOW (ref 39.0–52.0)
Hemoglobin: 7 g/dL — ABNORMAL LOW (ref 13.0–17.0)
MCH: 33.7 pg (ref 26.0–34.0)
MCHC: 30.8 g/dL (ref 30.0–36.0)
MCV: 109.1 fL — ABNORMAL HIGH (ref 80.0–100.0)
Platelets: 102 10*3/uL — ABNORMAL LOW (ref 150–400)
RBC: 2.08 MIL/uL — ABNORMAL LOW (ref 4.22–5.81)
RDW: 20.8 % — ABNORMAL HIGH (ref 11.5–15.5)
WBC: 5.4 10*3/uL (ref 4.0–10.5)
nRBC: 0 % (ref 0.0–0.2)

## 2023-01-27 LAB — BASIC METABOLIC PANEL
Anion gap: 7 (ref 5–15)
BUN: 24 mg/dL — ABNORMAL HIGH (ref 8–23)
CO2: 23 mmol/L (ref 22–32)
Calcium: 7.8 mg/dL — ABNORMAL LOW (ref 8.9–10.3)
Chloride: 103 mmol/L (ref 98–111)
Creatinine, Ser: 1.02 mg/dL (ref 0.61–1.24)
GFR, Estimated: >60 mL/min (ref >60)
Glucose, Bld: 121 mg/dL — ABNORMAL HIGH (ref 70–99)
Potassium: 4.1 mmol/L (ref 3.5–5.1)
Sodium: 133 mmol/L — ABNORMAL LOW (ref 135–145)

## 2023-01-27 LAB — HEMOGLOBIN AND HEMATOCRIT, BLOOD
HCT: 28.4 % — ABNORMAL LOW (ref 39.0–52.0)
Hemoglobin: 8.7 g/dL — ABNORMAL LOW (ref 13.0–17.0)

## 2023-01-27 MED ORDER — ALBUTEROL SULFATE (2.5 MG/3ML) 0.083% IN NEBU
2.5000 mg | INHALATION_SOLUTION | Freq: Every day | RESPIRATORY_TRACT | Status: DC
  Administered 2023-01-27 (×2): 2.5 mg via RESPIRATORY_TRACT
  Filled 2023-01-27 (×2): qty 3

## 2023-01-27 MED ORDER — ALBUTEROL SULFATE HFA 108 (90 BASE) MCG/ACT IN AERS
2.0000 | INHALATION_SPRAY | Freq: Every day | RESPIRATORY_TRACT | Status: DC
  Administered 2023-01-27 – 2023-01-30 (×4): 2 via RESPIRATORY_TRACT
  Filled 2023-01-27: qty 6.7

## 2023-01-27 MED ORDER — TRAZODONE HCL 50 MG PO TABS
25.0000 mg | ORAL_TABLET | Freq: Every day | ORAL | Status: DC
  Administered 2023-01-27 – 2023-01-30 (×5): 25 mg via ORAL
  Filled 2023-01-27 (×5): qty 1

## 2023-01-27 MED ORDER — ALBUTEROL SULFATE HFA 108 (90 BASE) MCG/ACT IN AERS: 2.0000 | INHALATION_SPRAY | Freq: Every day | RESPIRATORY_TRACT | Status: DC

## 2023-01-27 NOTE — Progress Notes (Signed)
Physical Therapy Treatment Patient Details Name: Bryan [name] MRN: [id number] DOB: 1940/11/21 Today's Date: 01/27/2023   History of Present Illness Pt is an 82 y/o M admitted on 01/25/23 after presenting with c/o a fall & L hip fx. Pt found to have acute nondisplaced fracture through the greater trochanter of the left femur most consistent with an avulsion fracture on imaging. Ortho recommends conservative management. Of note, pt with recent admission to [facility] 01/13/23-01/19/23 for acute HFrEF. PMH: HTN, HLD, HFrEF, a-fib, centrilobular emphysema, bicuspid AV s/p bioprosthetic aortic valve, CKD 3, RA, iron deficiency anemia, BPH, GERD, SBO s/p hemicolectomy & ileal resection, dementia    PT Comments  Pt resting in recliner upon PT arrival; agreeable to therapy.  5-6/10 L hip pain at rest beginning/end of session (pain increased with activity); pt pre-medicated with pain medication for session.  During session pt mod assist progressing to min assist with sit to stand transfers using RW and min assist to take x5 steps in place B LE's with RW use (limited d/t L hip pain).  Will continue to focus on strengthening and progressive functional mobility per pt tolerance.    If plan is discharge home, recommend the following: A lot of help with walking and/or transfers;A lot of help with bathing/dressing/bathroom;Assistance with cooking/housework;Direct supervision/assist for financial management;Assist for transportation;Direct supervision/assist for medications management;Help with stairs or ramp for entrance   Can travel by private vehicle      No  Equipment Recommendations  None recommended by PT (TBD at next venue of care)    Recommendations for Other Services       Precautions / Restrictions Precautions Precautions: Fall Restrictions Weight Bearing Restrictions: Yes LLE Weight Bearing: Weight bearing as tolerated Other Position/Activity Restrictions: WBAT L leg with walker use      Mobility  Bed Mobility               General bed mobility comments: Deferred (pt in recliner beginning/end of session)    Transfers Overall transfer level: Needs assistance Equipment used: Rolling walker (2 wheels) Transfers: Sit to/from Stand Sit to Stand: Mod assist, Min assist           General transfer comment: x2 trials mod assist to stand; x4 trials min assist to stand (with R foot blocked to prevent from slipping forward); vc's for UE/LE placement; assist to initiate stand and control descent sitting    Ambulation/Gait Ambulation/Gait assistance: Min assist Gait Distance (Feet):  (pt took x5 steps in place with B LE's) Assistive device: Rolling walker (2 wheels)   Gait velocity: decreased     General Gait Details: antalgic; decreased stance time L LE; minimal R foot clearance (good L foot clearance) with steps; limited d/t L hip pain; vc's for technique and walker use   Stairs             Wheelchair Mobility     Tilt Bed    Modified Rankin (Stroke Patients Only)       Balance Overall balance assessment: Needs assistance Sitting-balance support: No upper extremity supported, Feet supported Sitting balance-Leahy Scale: Good Sitting balance - Comments: steady reaching within BOS   Standing balance support: Bilateral upper extremity supported, Reliant on assistive device for balance Standing balance-Leahy Scale: Fair Standing balance comment: steady static standing with B UE support on RW                            Cognition Arousal: Alert  Behavior During Therapy: WFL for tasks assessed/performed Overall Cognitive Status: Within Functional Limits for tasks assessed                                          Exercises General Exercises - Lower Extremity Long Arc Quad: AROM, Strengthening, Both, 10 reps, Seated Hip Flexion/Marching: AROM, Strengthening, Both, 10 reps, Seated (minimal L hip flexion d/t L hip  pain)    General Comments  Nursing cleared pt for participation in physical therapy.  Pt agreeable to PT session.      Pertinent Vitals/Pain Pain Assessment Pain Assessment: 0-10 Pain Score: 6  Pain Location: L hip Pain Descriptors / Indicators: Discomfort, Grimacing, Guarding Pain Intervention(s): Limited activity within patient's tolerance, Monitored during session, Premedicated before session, Repositioned Vitals (HR and SpO2 on room air) stable and WFL throughout treatment session.    Home Living                          Prior Function            PT Goals (current goals can now be found in the care plan section) Acute Rehab PT Goals Patient Stated Goal: decrease pain; improve mobility PT Goal Formulation: With patient Time For Goal Achievement: 02/09/23 Potential to Achieve Goals: Good Progress towards PT goals: Progressing toward goals    Frequency    Min 1X/week      PT Plan      Co-evaluation              AM-PAC PT "6 Clicks" Mobility   Outcome Measure  Help needed turning from your back to your side while in a flat bed without using bedrails?: A Little Help needed moving from lying on your back to sitting on the side of a flat bed without using bedrails?: A Little Help needed moving to and from a bed to a chair (including a wheelchair)?: A Little Help needed standing up from a chair using your arms (e.g., wheelchair or bedside chair)?: A Lot Help needed to walk in hospital room?: A Lot Help needed climbing 3-5 steps with a railing? : Total 6 Click Score: 14    End of Session Equipment Utilized During Treatment: Gait belt Activity Tolerance: Patient limited by pain Patient left: in chair;with call bell/phone within reach;with chair alarm set Nurse Communication: Mobility status;Precautions PT Visit Diagnosis: Muscle weakness (generalized) (M62.81);Pain;Unsteadiness on feet (R26.81);Other abnormalities of gait and mobility  (R26.89);Difficulty in walking, not elsewhere classified (R26.2) Pain - Right/Left: Left Pain - part of body: Hip     Time: 4132-4401 PT Time Calculation (min) (ACUTE ONLY): 19 min  Charges:    $Therapeutic Activity: 8-22 mins PT General Charges $$ ACUTE PT VISIT: 1 Visit                    Hendricks Limes, PT 01/27/23, 3:59 PM

## 2023-01-27 NOTE — Progress Notes (Signed)
Pt asked for Sleep medicine and albuterol inhaler be resumed. Hospitalist made aware. Dr. Arville Care verbal ordered Trazodone 25mg  and to resume home albuterol inhaler.

## 2023-01-27 NOTE — Progress Notes (Addendum)
Progress Note   Patient: Bryan [name] GEX:[id number] DOB: 14-Jun-1940 DOA: 01/25/2023     2 DOS: the patient was seen and examined on 01/27/2023   Brief hospital course: HPI on admission 01/25/23: "Bryan [name] is a 82 y.o. male with medical history significant of HTN, HLD, HFrEF (LVEF 40% 09/2022), A-fib (on Eliquis), centrilobular emphysema, bicuspid AV s/p bioprosthetic aortic valve (2010), CKD stage III, rheumatoid arthritis, iron deficiency anemia, BPH, GERD, prior SBO s/p hemicolectomy and ileal resection (2023) presenting with fall and left hip fracture.  Patient noted to have been recently admitted in the [facility] system August 22 through August 28 for acute HFrEF.  History primarily from patient's sister in the setting of baseline dementia.  Per report, patient with overall progressive decline over several months.  Currently lives at home alone.  Per report, had a mechanical fall where patient hit his head and landed on his left side.  EMS transport was initially refused.  However, patient woke up with the inability to ambulate on the left side.  Family unclear if patient is been taking his medications as prescribed. "  Patient was admitted to hospitalist service with Orthopedic surgery consulted.  Hip fracture is non-surgical, being managed conservatively.  Pt was cleared to work with PT/OT with weight-bearing as tolerated on left leg with walker.  Further hospital course and management as outlined below.   Assessment and Plan:  * Hip fracture (HCC) Acute nondisplaced left greater trochanteric fracture, likely avulsion.  --Orthopedic surgery consulted --Non-surgical management --PT/OT evaluation  --Will need SNF placement  --Pain control PRN --WBAT on LLE  Chronic diastolic CHF (congestive heart failure) (HCC) 2D ECHO 09/2022 w/ EF 40% 09/2022  Looks euvolemic  --Cont home regimen  --Monitor  volume status  Pancytopenia (HCC) Iron deficiency Cell lines stable.  No active  bleeding  Hbg borderline 6.8>>7.8>>7.0 --Repeat Hbg this afternoon --IV iron infusion given 9/4 --Monitor CBC --Transfuse for hgb <7   Essential hypertension BP stable  --Continue home regimen & titrate as needed  Coronary artery disease Stable, no active chest pain. Baseline nonobstructive CAD. --Cont home regimen  Hyperlipidemia Cont statin   Gastroesophageal reflux disease PPI  Chronic kidney disease, stage 3a (HCC) Cr 1.5 on admission, stable. Cr improved 1.19 >> 1.02 --Monitor BMP  Paroxysmal atrial fibrillation (HCC) Rate controlled  --Continue Eliquis --Continue to weight risks/benefits in follow upgiven fall with fracture        Subjective: Pt awake resting in bed this AM. He reports hip pain is moderate.  His back pain bothers him more than hip when in the bed.  Eating breakfast.  Says he feels well and denies acute complaints.  Physical Exam: Vitals:   01/26/23 0857 01/26/23 1523 01/26/23 2254 01/27/23 0858  BP: 121/72 (!) 119/58 (!) 147/70 (!) 152/81  Pulse: 80 65 81 81  Resp: 18 18 20 19   Temp: 98.1 F (36.7 C) 98.4 F (36.9 C) 97.9 F (36.6 C) 98 F (36.7 C)  TempSrc:      SpO2: 95% 97% 97% 94%  Weight:      Height:       General exam: awake, alert, no acute distress HEENT: moist mucus membranes, hearing grossly normal  Respiratory system: CTAB, no wheezes, rales or rhonchi, normal respiratory effort. Cardiovascular system: normal S1/S2, RRR, no pedal edema.   Gastrointestinal system: soft, NT, ND, no HSM felt, +bowel sounds. Central nervous system: A&O x 3. no gross focal neurologic deficits, normal speech Extremities: moves all, no  edema, normal tone Skin: dry, intact, normal temperature Psychiatry: normal mood, congruent affect, judgement and insight appear normal   Data Reviewed:  Notable labs ---  Cr 1.48 >> 1.19 >> 1.02 Glucose 121, BUN 24, Ca 7.8,  Hbg 6.8 >> 7.8 >> 7.0 stable Platelets 100 >> 104 >> 102k   Family  Communication: None present, will attempt to call  Disposition: Status is: Inpatient Remains inpatient appropriate because: needs SNF placement, ongoing evaluation   Planned Discharge Destination: Skilled nursing facility    Time spent: 35 minutes  Author: Pennie Banter, DO 01/27/2023 2:25 PM  For on call review [email or web address].

## 2023-01-27 NOTE — Plan of Care (Signed)
  Problem: Safety: Goal: Ability to remain free from injury will improve Outcome: Progressing   Problem: Elimination: Goal: Will not experience complications related to bowel motility Outcome: Progressing   Problem: Pain Managment: Goal: General experience of comfort will improve Outcome: Progressing   Problem: Skin Integrity: Goal: Risk for impaired skin integrity will decrease Outcome: Progressing   Problem: Nutrition: Goal: Adequate nutrition will be maintained Outcome: Progressing   Problem: Activity: Goal: Risk for activity intolerance will decrease Outcome: Progressing

## 2023-01-27 NOTE — TOC Progression Note (Addendum)
Transition of Care [facility]) - Progression Note    Patient Details  Name: Bryan [name] MRN: [id number] Date of Birth: 07/10/1940  [facility] CM/SW Contact  Marlowe Sax, RN Phone Number: 01/27/2023, 2:34 PM  Clinical Narrative:     Spoke with the sisters Dondra Spry and GNFA [phone], they feel that he is not safe at being sent home I explained that he is going to go to [facility] SNF and that Long term is not covered by his insurance They stated that they have applied for Medicaid and are working with [employer]  I explained that he is alert and oriented and I would ask if he wanted me to review bed offers with his sister, I did ask the patient and he said he wanted me to review with him but could share with his sisters, I reviewed the bed offers and he chose [facility] in [place], I called his sisters and Dondra Spry stated that he has been there before and they prefer [facility], I explained that I reviewed that with him and he chose [facility], She stated that she would remind him that they prefer [facility], She will call me back  I explained I needed a firm choice so that I can reserve the bed and start Ins auth process  They called back and confirmed that they want [facility] I accepted the bed with [facility] portal is down will start Ins auth in the morning  Expected Discharge Plan: Skilled Nursing Facility Barriers to Discharge: SNF Pending bed offer, Insurance Authorization  Expected Discharge Plan and Services   Discharge Planning Services: CM Consult   Living arrangements for the past 2 months: Single Family Home                                       Social Determinants of Health (SDOH) Interventions SDOH Screenings   Food Insecurity: No Food Insecurity (01/25/2023)  Housing: Low Risk  (01/25/2023)  Transportation Needs: No Transportation Needs (01/25/2023)  Utilities: Not At Risk (01/25/2023)  Depression (PHQ2-9): Low Risk  (04/26/2022)  Financial Resource Strain:  Low Risk  (10/13/2022)   Received from [facility]  Physical Activity: Sufficiently Active (02/22/2022)  Social Connections: Unknown (02/22/2022)  Stress: No Stress Concern Present (02/22/2022)  Tobacco Use: Medium Risk (01/25/2023)    Readmission Risk Interventions     No data to display

## 2023-01-28 DIAGNOSIS — D509 Iron deficiency anemia, unspecified: Secondary | ICD-10-CM | POA: Diagnosis not present

## 2023-01-28 DIAGNOSIS — D696 Thrombocytopenia, unspecified: Secondary | ICD-10-CM | POA: Diagnosis not present

## 2023-01-28 DIAGNOSIS — S72002A Fracture of unspecified part of neck of left femur, initial encounter for closed fracture: Secondary | ICD-10-CM | POA: Diagnosis not present

## 2023-01-28 LAB — CBC
HCT: 21.7 % — ABNORMAL LOW (ref 39.0–52.0)
Hemoglobin: 6.8 g/dL — ABNORMAL LOW (ref 13.0–17.0)
MCH: 34.2 pg — ABNORMAL HIGH (ref 26.0–34.0)
MCHC: 31.3 g/dL (ref 30.0–36.0)
MCV: 109 fL — ABNORMAL HIGH (ref 80.0–100.0)
Platelets: 109 10*3/uL — ABNORMAL LOW (ref 150–400)
RBC: 1.99 MIL/uL — ABNORMAL LOW (ref 4.22–5.81)
RDW: 21.1 % — ABNORMAL HIGH (ref 11.5–15.5)
WBC: 4.9 10*3/uL (ref 4.0–10.5)
nRBC: 0 % (ref 0.0–0.2)

## 2023-01-28 LAB — HEMOGLOBIN AND HEMATOCRIT, BLOOD
HCT: 23.2 % — ABNORMAL LOW (ref 39.0–52.0)
Hemoglobin: 7.1 g/dL — ABNORMAL LOW (ref 13.0–17.0)

## 2023-01-28 LAB — PREPARE RBC (CROSSMATCH)

## 2023-01-28 MED ORDER — SODIUM CHLORIDE 0.9% IV SOLUTION: Freq: Once | INTRAVENOUS | Status: AC

## 2023-01-28 MED ORDER — EMPAGLIFLOZIN 10 MG PO TABS
10.0000 mg | ORAL_TABLET | Freq: Every day | ORAL | Status: DC
  Administered 2023-01-28 – 2023-01-31 (×4): 10 mg via ORAL
  Filled 2023-01-28 (×4): qty 1

## 2023-01-28 MED ORDER — TAMSULOSIN HCL 0.4 MG PO CAPS
0.4000 mg | ORAL_CAPSULE | Freq: Every day | ORAL | Status: DC
  Administered 2023-01-28 – 2023-01-30 (×3): 0.4 mg via ORAL
  Filled 2023-01-28 (×3): qty 1

## 2023-01-28 NOTE — Plan of Care (Signed)
  Problem: Activity: Goal: Risk for activity intolerance will decrease Outcome: Progressing   Problem: Nutrition: Goal: Adequate nutrition will be maintained Outcome: Progressing   Problem: Coping: Goal: Level of anxiety will decrease Outcome: Progressing   Problem: Elimination: Goal: Will not experience complications related to bowel motility Outcome: Progressing   Problem: Pain Managment: Goal: General experience of comfort will improve Outcome: Progressing   Problem: Skin Integrity: Goal: Risk for impaired skin integrity will decrease Outcome: Progressing   

## 2023-01-28 NOTE — Assessment & Plan Note (Signed)
No active bleeding.  Left hip ecchymosis and swelling appears stable. Hbg fluctuating from 6.8 to as high as 8.7.  Transfused 1 unit pRBC's 01/28/23 IV iron infusion given 9/4 --Monitor CBC --Transfuse for hgb <7 or < 8 with any signs of bleeding

## 2023-01-28 NOTE — Progress Notes (Signed)
Patient ID: Bryan [name], male   DOB: 1940/09/29, 82 y.o.   MRN: [id number]  Subjective: The patient denies any new complaints pertaining to his left hip.  He does note increased discomfort with weightbearing, but is able to take some steps with physical therapy.   Objective: Vital signs in last 24 hours: Temp:  [98 F (36.7 C)] 98 F (36.7 C) (09/05 2342) Pulse Rate:  [74-81] 81 (09/05 2342) Resp:  [17-19] 17 (09/05 2342) BP: (125-152)/(60-81) 125/60 (09/05 2342) SpO2:  [94 %-95 %] 94 % (09/05 2342) FiO2 (%):  [21 %] 21 % (09/05 2042)  Intake/Output from previous day: 09/05 0701 - 09/06 0700 In: -  Out: 800 [Urine:800] Intake/Output this shift: No intake/output data recorded.  Recent Labs    01/26/23 0525 01/26/23 1326 01/27/23 0409 01/27/23 1444 01/28/23 0433  HGB 6.8* 7.8* 7.0* 8.7* 6.8*   Recent Labs    01/27/23 0409 01/27/23 1444 01/28/23 0433  WBC 5.4  --  4.9  RBC 2.08*  --  1.99*  HCT 22.7* 28.4* 21.7*  PLT 102*  --  109*   Recent Labs    01/26/23 0525 01/27/23 0409  NA 132* 133*  K 4.1 4.1  CL 105 103  CO2 21* 23  BUN 28* 24*  CREATININE 1.19 1.02  GLUCOSE 163* 121*  CALCIUM 7.8* 7.8*   Recent Labs    01/25/23 1039  INR 2.8*    Physical Exam: Orthopedic examination again is limited to the left hip and lower extremity.  Skin inspection around the left hip is unremarkable.  No swelling, erythema, ecchymosis, abrasions, or other skin abnormalities are identified.  He has mild tenderness to palpation of the lateral aspect of the left hip.  He has more moderate pain with any attempted active or passive motion of the hip, although he is able to tolerate leg rolling.  He is grossly neurovascularly intact to the left lower extremity and foot.  Assessment: Minimally displaced greater trochanteric fracture of the left hip.  Plan: The patient will continue to be managed nonsurgically for this injury.  He may be mobilized with physical therapy,  weightbearing as tolerated on the left leg, and using a walker for balance and support.  I agree with the plans for rehab placement.  He may continue to receive medication for pain as deemed medically necessary.  Thank you for asked me to participate in the care of this most pleasant man.  I will sign off at this time.  Please arrange for the patient to follow-up in our office with one of our PAs in 1 month for repeat x-rays of his left hip.  If you have further need of orthopedic input during this hospitalization, please reconsult me.   Excell Seltzer Naketa Daddario 01/28/2023, 7:57 AM

## 2023-01-28 NOTE — Progress Notes (Signed)
Physical Therapy Treatment Patient Details Name: Bryan [name] MRN: [id number] DOB: January 14, 1941 Today's Date: 01/28/2023   History of Present Illness Pt is an 82 y/o M admitted on 01/25/23 after presenting with c/o a fall & L hip fx. Pt found to have acute nondisplaced fracture through the greater trochanter of the left femur most consistent with an avulsion fracture on imaging. Ortho recommends conservative management. Of note, pt with recent admission to [facility] 01/13/23-01/19/23 for acute HFrEF. PMH: HTN, HLD, HFrEF, a-fib, centrilobular emphysema, bicuspid AV s/p bioprosthetic aortic valve, CKD 3, RA, iron deficiency anemia, BPH, GERD, SBO s/p hemicolectomy & ileal resection, dementia    PT Comments  Pt resting in recliner upon PT arrival; agreeable to therapy; 5/10 L hip pain at rest beginning/end of session (pain increased with activity). Performed LE ex's with assist as needed.  During session pt mod assist to stand from recliner up to RW; min assist to ambulate a few feet recliner to bed with RW use (limited distance walking d/t L hip pain and fatigue); and min assist to lay down in bed.  Limited session d/t pt fatigue and pt requesting to rest in bed.  Will continue to focus on strengthening and progressive functional mobility during hospitalization.    If plan is discharge home, recommend the following: A lot of help with walking and/or transfers;A lot of help with bathing/dressing/bathroom;Assistance with cooking/housework;Direct supervision/assist for financial management;Assist for transportation;Direct supervision/assist for medications management;Help with stairs or ramp for entrance   Can travel by private vehicle     No  Equipment Recommendations  Other (comment) (TBD at next venue of care)    Recommendations for Other Services       Precautions / Restrictions Precautions Precautions: Fall Restrictions Weight Bearing Restrictions: Yes LLE Weight Bearing: Weight bearing as  tolerated Other Position/Activity Restrictions: WBAT L leg with walker use     Mobility  Bed Mobility Overal bed mobility: Needs Assistance Bed Mobility: Sit to Supine       Sit to supine: Min assist, HOB elevated   General bed mobility comments: assist for L LE    Transfers Overall transfer level: Needs assistance Equipment used: Rolling walker (2 wheels) Transfers: Sit to/from Stand Sit to Stand: Mod assist (from recliner)           General transfer comment: R foot blocked to prevent foot from sliding forward; assist to initiate stand and control descent sitting; vc's for technique    Ambulation/Gait Ambulation/Gait assistance: Min assist Gait Distance (Feet): 3 Feet (recliner to bed) Assistive device: Rolling walker (2 wheels)   Gait velocity: decreased     General Gait Details: antalgic; decreased stance time L LE; minimal R foot clearance (good L foot clearance) with steps; limited d/t L hip pain; vc's for technique and walker use   Stairs             Wheelchair Mobility     Tilt Bed    Modified Rankin (Stroke Patients Only)       Balance Overall balance assessment: Needs assistance Sitting-balance support: No upper extremity supported, Feet supported Sitting balance-Leahy Scale: Good Sitting balance - Comments: steady reaching within BOS   Standing balance support: Bilateral upper extremity supported, Reliant on assistive device for balance Standing balance-Leahy Scale: Fair Standing balance comment: steady static standing with B UE support on RW  Cognition Arousal: Alert Behavior During Therapy: WFL for tasks assessed/performed Overall Cognitive Status: Within Functional Limits for tasks assessed                                          Exercises Total Joint Exercises Ankle Circles/Pumps: AROM, Strengthening, Both, 10 reps, Supine Quad Sets: AROM, Strengthening, Both, 10 reps,  Supine Short Arc Quad: AROM, Strengthening, Both, 10 reps, Supine Heel Slides: AROM, Right, AAROM, Left, Strengthening, 10 reps, Supine Hip ABduction/ADduction: AROM, Strengthening, Right, 10 reps, Supine    General Comments  Nursing cleared pt for participation in physical therapy.  Pt agreeable to PT session.      Pertinent Vitals/Pain Pain Assessment Pain Assessment: 0-10 Pain Score: 5  Pain Location: L hip Pain Descriptors / Indicators: Discomfort, Grimacing, Guarding Pain Intervention(s): Limited activity within patient's tolerance, Monitored during session, Premedicated before session, Repositioned Vitals (HR and SpO2 on room air) stable and WFL throughout treatment session.    Home Living                          Prior Function            PT Goals (current goals can now be found in the care plan section) Acute Rehab PT Goals Patient Stated Goal: decrease pain; improve mobility PT Goal Formulation: With patient Time For Goal Achievement: 02/09/23 Potential to Achieve Goals: Good Progress towards PT goals: Progressing toward goals    Frequency    Min 1X/week      PT Plan      Co-evaluation              AM-PAC PT "6 Clicks" Mobility   Outcome Measure  Help needed turning from your back to your side while in a flat bed without using bedrails?: A Little Help needed moving from lying on your back to sitting on the side of a flat bed without using bedrails?: A Little Help needed moving to and from a bed to a chair (including a wheelchair)?: A Little Help needed standing up from a chair using your arms (e.g., wheelchair or bedside chair)?: A Lot Help needed to walk in hospital room?: A Lot Help needed climbing 3-5 steps with a railing? : Total 6 Click Score: 14    End of Session Equipment Utilized During Treatment: Gait belt Activity Tolerance: Patient limited by pain Patient left: in bed;with call bell/phone within reach;with bed alarm  set;Other (comment) (B heels floating via pillow support) Nurse Communication: Mobility status;Precautions PT Visit Diagnosis: Muscle weakness (generalized) (M62.81);Pain;Unsteadiness on feet (R26.81);Other abnormalities of gait and mobility (R26.89);Difficulty in walking, not elsewhere classified (R26.2) Pain - Right/Left: Left Pain - part of body: Hip     Time: 4098-1191 PT Time Calculation (min) (ACUTE ONLY): 16 min  Charges:    $Therapeutic Activity: 8-22 mins PT General Charges $$ ACUTE PT VISIT: 1 Visit                     Hendricks Limes, PT 01/28/23, 4:52 PM

## 2023-01-28 NOTE — TOC Progression Note (Signed)
Transition of Care [facility]) - Progression Note    Patient Details  Name: Bryan [name] MRN: [id number] Date of Birth: 1941-04-18  Transition of Care [facility]) CM/SW Contact  Marlowe Sax, RN Phone Number: 01/28/2023, 11:54 AM  Clinical Narrative:     The patient is to DC to [facility] and rehab Ins Berkley Harvey is pending Ref number [id number]  Expected Discharge Plan: Skilled Nursing Facility Barriers to Discharge: SNF Pending bed offer, Insurance Authorization  Expected Discharge Plan and Services   Discharge Planning Services: CM Consult   Living arrangements for the past 2 months: Single Family Home                                       Social Determinants of Health (SDOH) Interventions SDOH Screenings   Food Insecurity: No Food Insecurity (01/25/2023)  Housing: Low Risk  (01/25/2023)  Transportation Needs: No Transportation Needs (01/25/2023)  Utilities: Not At Risk (01/25/2023)  Depression (PHQ2-9): Low Risk  (04/26/2022)  Financial Resource Strain: Low Risk  (10/13/2022)   Received from [facility], [facility]  Physical Activity: Sufficiently Active (02/22/2022)  Social Connections: Unknown (02/22/2022)  Stress: No Stress Concern Present (02/22/2022)  Tobacco Use: Medium Risk (01/25/2023)    Readmission Risk Interventions     No data to display

## 2023-01-28 NOTE — Assessment & Plan Note (Signed)
Platelets stable in low 100's. Monitor CBC

## 2023-01-28 NOTE — Progress Notes (Signed)
Progress Note   Patient: Bryan [name] VHQ:[id number] DOB: 1940/09/19 DOA: 01/25/2023     3 DOS: the patient was seen and examined on 01/28/2023   Brief hospital course: HPI on admission 01/25/23: "FEDERICK DACE is a 82 y.o. male with medical history significant of HTN, HLD, HFrEF (LVEF 40% 09/2022), A-fib (on Eliquis), centrilobular emphysema, bicuspid AV s/p bioprosthetic aortic valve (2010), CKD stage III, rheumatoid arthritis, iron deficiency anemia, BPH, GERD, prior SBO s/p hemicolectomy and ileal resection (2023) presenting with fall and left hip fracture.  Patient noted to have been recently admitted in the [facility] system August 22 through August 28 for acute HFrEF.  History primarily from patient's sister in the setting of baseline dementia.  Per report, patient with overall progressive decline over several months.  Currently lives at home alone.  Per report, had a mechanical fall where patient hit his head and landed on his left side.  EMS transport was initially refused.  However, patient woke up with the inability to ambulate on the left side.  Family unclear if patient is been taking his medications as prescribed. "  Patient was admitted to hospitalist service with Orthopedic surgery consulted.  Hip fracture is non-surgical, being managed conservatively.  Pt was cleared to work with PT/OT with weight-bearing as tolerated on left leg with walker.  Further hospital course and management as outlined below.   Assessment and Plan:  * Hip fracture (HCC) Acute nondisplaced left greater trochanteric fracture, likely avulsion.  --Orthopedic surgery consulted --Non-surgical management --PT/OT evaluation  --Will need SNF placement  --Pain control PRN --WBAT on LLE  Iron deficiency anemia No active bleeding.  Left hip ecchymosis and swelling appears stable. Hbg fluctuating from 6.8 to as high as 8.7.  This AM 6.8 >> 7.1 on repeat. --Type & screen --1 unit pRBC's for  transfusion --Repeat Hbg post-transfusion --IV iron infusion given 9/4 --Monitor CBC --Transfuse for hgb <7 or < 8 with any signs of bleeding  Chronic diastolic CHF (congestive heart failure) (HCC) 2D ECHO 09/2022 w/ EF 40% 09/2022  Looks euvolemic  --Cont home regimen  --Monitor  volume status  Thrombocytopenia (HCC) Platelets stable in low 100's. Monitor CBC  Essential hypertension BP stable  --Continue home regimen & titrate as needed  Coronary artery disease Stable, no active chest pain. Baseline nonobstructive CAD. --Cont home regimen  Hyperlipidemia Cont statin   Gastroesophageal reflux disease PPI  Chronic kidney disease, stage 3a (HCC) Cr 1.5 on admission, stable. Cr improved 1.19 >> 1.02 --Monitor BMP  Paroxysmal atrial fibrillation (HCC) Rate controlled  --Continue Eliquis --Continue to weight risks/benefits in follow upgiven fall with fracture        Subjective: Pt awake resting in bed this AM, visiting with close friends at bedside.  He reports hip being sore, not in severe pain.  He denies other acute complaints. He is agreeable to blood transfusion today.   Physical Exam: Vitals:   01/28/23 1320 01/28/23 1353 01/28/23 1354 01/28/23 1514  BP: 117/61 (!) 122/56 (!) 122/56 115/62  Pulse: 71 75 75 75  Resp: 16 16 16 14   Temp: 97.9 F (36.6 C) 97.9 F (36.6 C) 97.9 F (36.6 C) 98.2 F (36.8 C)  TempSrc:   Oral   SpO2: 91%  91% 93%  Weight:      Height:       General exam: awake, alert, no acute distress HEENT: moist mucus membranes, hearing grossly normal  Respiratory system: CTAB, no wheezes, rales or rhonchi, normal respiratory  effort. Cardiovascular system: normal S1/S2, RRR, no pedal edema.   Gastrointestinal system: soft, NT, ND, no HSM felt, +bowel sounds. Central nervous system: A&O x 3. no gross focal neurologic deficits, normal speech Extremities: left lateral hip ecchymosis and swelling appear stable (outlined in ink by RN) Skin:  dry, intact, normal temperature Psychiatry: normal mood, congruent affect, judgement and insight appear normal   Data Reviewed:  Notable labs ---  Hbg 6.8 >> 7.1 this AM (ordered for 1 unit pRBC transfusion)  Cr 1.48 >> 1.19 >> 1.02 Glucose 121, BUN 24, Ca 7.8,  Hbg 6.8 >> 7.8 >> 7.0 stable Platelets 100 >> 104 >> 102k   Family Communication: Close friends at bedside on rounds today  Disposition: Status is: Inpatient Remains inpatient appropriate because: needs SNF placement, ongoing evaluation   Planned Discharge Destination: Skilled nursing facility    Time spent: 42 minutes  Author: Pennie Banter, DO 01/28/2023 4:18 PM  For on call review [email or web address].

## 2023-01-28 NOTE — Care Management Important Message (Signed)
Important Message  Patient Details  Name: Bryan [name] MRN: [id number] Date of Birth: [birth year]   Medicare Important Message Given:  Yes     Olegario Messier A Tobie Hellen 01/28/2023, 12:01 PM

## 2023-01-28 NOTE — Progress Notes (Addendum)
Occupational Therapy Treatment Patient Details Name: Bryan [name] MRN: [id number] DOB: 08-15-1940 Today's Date: 01/28/2023   History of present illness Pt is an 82 y/o M admitted on 01/25/23 after presenting with c/o a fall & L hip fx. Pt found to have acute nondisplaced fracture through the greater trochanter of the left femur most consistent with an avulsion fracture on imaging. Ortho recommends conservative management. Of note, pt with recent admission to [facility] 01/13/23-01/19/23 for acute HFrEF. PMH: HTN, HLD, HFrEF, a-fib, centrilobular emphysema, bicuspid AV s/p bioprosthetic aortic valve, CKD 3, RA, iron deficiency anemia, BPH, GERD, SBO s/p hemicolectomy & ileal resection, dementia   OT comments  Dr. Denton Lank in room with pt upon OT arrival.  Planning blood transfusion later today, but Dr. Denton Lank advised pt ok to participate in OOB activities as long as he did not feel dizzy.  Pt was able to tolerate transfer bed to chair without reports of dizziness.  Functional transfers continue to require min A and extra time d/t pain and limited tolerance for WB through the LLE when standing.  Pt not yet able to tolerate ADLs in standing; grooming set up provided for oral care while seated in recliner.  All necessary items placed within reach at end of session.  Will continue to follow in the acute setting to maximize indep with ADLs and functional mobility.       If plan is discharge home, recommend the following:  A lot of help with bathing/dressing/bathroom;Assistance with cooking/housework;Direct supervision/assist for medications management;Assist for transportation;A lot of help with walking and/or transfers;Help with stairs or ramp for entrance   Equipment Recommendations  Other (comment) (defer to next venue of care)    Recommendations for Other Services      Precautions / Restrictions Precautions Precautions: Fall Restrictions Weight Bearing Restrictions: Yes LLE Weight Bearing: Weight  bearing as tolerated       Mobility Bed Mobility Overal bed mobility: Needs Assistance Bed Mobility: Supine to Sit     Supine to sit: Min assist, HOB elevated, Used rails     General bed mobility comments: increased time and effort Patient Response: Cooperative  Transfers Overall transfer level: Needs assistance Equipment used: Rolling walker (2 wheels) Transfers: Sit to/from Stand Sit to Stand: Min assist, From elevated surface     Step pivot transfers: Min assist     General transfer comment: Pt continues to slide feet to pivot d/t pain with WB through the LLE     Balance Overall balance assessment: Needs assistance Sitting-balance support: No upper extremity supported, Feet supported Sitting balance-Leahy Scale: Good Sitting balance - Comments: steady reaching within BOS   Standing balance support: Bilateral upper extremity supported, Reliant on assistive device for balance Standing balance-Leahy Scale: Fair Standing balance comment: steady static standing with B UE support on RW                           ADL either performed or assessed with clinical judgement   ADL Overall ADL's : Needs assistance/impaired     Grooming: Sitting;Oral care;Set up Grooming Details (indicate cue type and reason): Pt not yet able to tolerate in standing d/t heavy reliance on BUE support into walker d/t limited tolerance for WB through the LLE d/t high pain levels                             Functional mobility during ADLs: Minimal  assistance;Rolling walker (2 wheels) General ADL Comments: Max A for LB ADLs d/t limited reach with L hip pain    Extremity/Trunk Assessment Upper Extremity Assessment Upper Extremity Assessment: Generalized weakness   Lower Extremity Assessment Lower Extremity Assessment: LLE deficits/detail;Defer to PT evaluation LLE Deficits / Details: post L hip fx; limited by pain        Vision Patient Visual Report: No change from  baseline                Cognition Arousal: Alert Behavior During Therapy: WFL for tasks assessed/performed Overall Cognitive Status: Within Functional Limits for tasks assessed                                 General Comments: AxOx4, pleasant, cooperative        Exercises Other Exercises Other Exercises: Set up to perform incentive spirometer x10 reps while seated in chair           General Comments sp02 sats ranged from 89%-91% on room air during activity.  Cues for PLB when SOB following transfer bed to chair.    Pertinent Vitals/ Pain       Pain Assessment Pain Assessment: 0-10 Pain Score: 7  Pain Location: L hip Pain Descriptors / Indicators: Discomfort, Grimacing, Guarding Pain Intervention(s): Limited activity within patient's tolerance, Monitored during session, Repositioned  Home Living                                          Prior Functioning/Environment              Frequency  Min 1X/week        Progress Toward Goals  OT Goals(current goals can now be found in the care plan section)  Progress towards OT goals: Progressing toward goals  Acute Rehab OT Goals Patient Stated Goal: Decrease pain and improve mobility OT Goal Formulation: With patient Time For Goal Achievement: 02/09/23 Potential to Achieve Goals: Good  Plan                       AM-PAC OT "6 Clicks" Daily Activity     Outcome Measure   Help from another person eating meals?: None Help from another person taking care of personal grooming?: A Little Help from another person toileting, which includes using toliet, bedpan, or urinal?: A Lot Help from another person bathing (including washing, rinsing, drying)?: A Lot Help from another person to put on and taking off regular upper body clothing?: None Help from another person to put on and taking off regular lower body clothing?: A Lot 6 Click Score: 17    End of Session Equipment  Utilized During Treatment: Gait belt;Rolling walker (2 wheels)  OT Visit Diagnosis: Unsteadiness on feet (R26.81);History of falling (Z91.81);Pain;Muscle weakness (generalized) (M62.81) Pain - Right/Left: Left Pain - part of body: Hip   Activity Tolerance Patient tolerated treatment well   Patient Left in chair;with call bell/phone within reach;with chair alarm set   Nurse Communication          Time: 1100-1113 OT Time Calculation (min): 13 min  Charges: OT General Charges $OT Visit: 1 Visit OT Treatments $Self Care/Home Management : 8-22 mins  Danelle Earthly, MS, OTR/L   Otis Dials 01/28/2023, 12:24 PM

## 2023-01-29 DIAGNOSIS — D696 Thrombocytopenia, unspecified: Secondary | ICD-10-CM | POA: Diagnosis not present

## 2023-01-29 DIAGNOSIS — D509 Iron deficiency anemia, unspecified: Secondary | ICD-10-CM | POA: Diagnosis not present

## 2023-01-29 DIAGNOSIS — N1831 Chronic kidney disease, stage 3a: Secondary | ICD-10-CM | POA: Diagnosis not present

## 2023-01-29 DIAGNOSIS — I5032 Chronic diastolic (congestive) heart failure: Secondary | ICD-10-CM | POA: Diagnosis not present

## 2023-01-29 LAB — CBC
HCT: 22.7 % — ABNORMAL LOW (ref 39.0–52.0)
Hemoglobin: 7.5 g/dL — ABNORMAL LOW (ref 13.0–17.0)
MCH: 34.2 pg — ABNORMAL HIGH (ref 26.0–34.0)
MCHC: 33 g/dL (ref 30.0–36.0)
MCV: 103.7 fL — ABNORMAL HIGH (ref 80.0–100.0)
Platelets: 112 10*3/uL — ABNORMAL LOW (ref 150–400)
RBC: 2.19 MIL/uL — ABNORMAL LOW (ref 4.22–5.81)
RDW: 22.3 % — ABNORMAL HIGH (ref 11.5–15.5)
WBC: 4.3 10*3/uL (ref 4.0–10.5)
nRBC: 0 % (ref 0.0–0.2)

## 2023-01-29 LAB — TYPE AND SCREEN
ABO/RH(D): O POS
Antibody Screen: NEGATIVE
Unit division: 0

## 2023-01-29 LAB — HEMOGLOBIN AND HEMATOCRIT, BLOOD
HCT: 24.4 % — ABNORMAL LOW (ref 39.0–52.0)
Hemoglobin: 7.7 g/dL — ABNORMAL LOW (ref 13.0–17.0)

## 2023-01-29 LAB — BASIC METABOLIC PANEL
Anion gap: 9 (ref 5–15)
BUN: 24 mg/dL — ABNORMAL HIGH (ref 8–23)
CO2: 22 mmol/L (ref 22–32)
Calcium: 7.9 mg/dL — ABNORMAL LOW (ref 8.9–10.3)
Chloride: 101 mmol/L (ref 98–111)
Creatinine, Ser: 0.92 mg/dL (ref 0.61–1.24)
GFR, Estimated: >60 mL/min (ref >60)
Glucose, Bld: 82 mg/dL (ref 70–99)
Potassium: 4.3 mmol/L (ref 3.5–5.1)
Sodium: 132 mmol/L — ABNORMAL LOW (ref 135–145)

## 2023-01-29 LAB — BPAM RBC
Blood Product Expiration Date: 202409272359
ISSUE DATE / TIME: 202409061335
Unit Type and Rh: 5100

## 2023-01-29 LAB — MAGNESIUM: Magnesium: 1.5 mg/dL — ABNORMAL LOW (ref 1.7–2.4)

## 2023-01-29 MED ORDER — ALBUTEROL SULFATE (2.5 MG/3ML) 0.083% IN NEBU
INHALATION_SOLUTION | RESPIRATORY_TRACT | Status: AC
  Filled 2023-01-29: qty 3

## 2023-01-29 MED ORDER — ALBUTEROL SULFATE HFA 108 (90 BASE) MCG/ACT IN AERS: 2.0000 | INHALATION_SPRAY | RESPIRATORY_TRACT | Status: DC | PRN

## 2023-01-29 MED ORDER — MAGNESIUM SULFATE 2 GM/50ML IV SOLN
2.0000 g | Freq: Once | INTRAVENOUS | Status: AC
  Administered 2023-01-29: 2 g via INTRAVENOUS
  Filled 2023-01-29: qty 50

## 2023-01-29 NOTE — Progress Notes (Signed)
Progress Note   Patient: Bryan [name]:[id number] DOB: 1940-11-05 DOA: 01/25/2023     4 DOS: the patient was seen and examined on 01/29/2023   Brief hospital course: HPI on admission 01/25/23: "Bryan [name] is a 82 y.o. male with medical history significant of HTN, HLD, HFrEF (LVEF 40% 09/2022), A-fib (on Eliquis), centrilobular emphysema, bicuspid AV s/p bioprosthetic aortic valve (2010), CKD stage III, rheumatoid arthritis, iron deficiency anemia, BPH, GERD, prior SBO s/p hemicolectomy and ileal resection (2023) presenting with fall and left hip fracture.  Patient noted to have been recently admitted in the [facility] system August 22 through August 28 for acute HFrEF.  History primarily from patient's sister in the setting of baseline dementia.  Per report, patient with overall progressive decline over several months.  Currently lives at home alone.  Per report, had a mechanical fall where patient hit his head and landed on his left side.  EMS transport was initially refused.  However, patient woke up with the inability to ambulate on the left side.  Family unclear if patient is been taking his medications as prescribed. "  Patient was admitted to hospitalist service with Orthopedic surgery consulted.  Hip fracture is non-surgical, being managed conservatively.  Pt was cleared to work with PT/OT with weight-bearing as tolerated on left leg with walker. SNF recommended for short-term rehab. TOC working on placement.  Patient has chronic iron deficiency anemia with borderline Hbg intermittently below 7.  Transfused 1 unit pRBC's and given iron infusion.  Further hospital course and management as outlined below.   Assessment and Plan:  * Hip fracture (HCC) Acute nondisplaced left greater trochanteric fracture, likely avulsion.  --Orthopedic surgery consulted --Non-surgical management --PT/OT evaluation  --Will need SNF placement  --Pain control PRN --WBAT on LLE  Iron deficiency  anemia No active bleeding.  Left hip ecchymosis and swelling appears stable. Hbg fluctuating from 6.8 to as high as 8.7.  Transfused 1 unit pRBC's 01/28/23 IV iron infusion given 9/4 --Monitor CBC --Transfuse for hgb <7 or < 8 with any signs of bleeding  Chronic diastolic CHF (congestive heart failure) (HCC) 2D ECHO 09/2022 w/ EF 40% 09/2022  Looks euvolemic  --Cont home regimen  --Monitor  volume status  Thrombocytopenia (HCC) Platelets stable in low 100's. Monitor CBC  Essential hypertension BP stable  --Continue home regimen & titrate as needed  Coronary artery disease Stable, no active chest pain. Baseline nonobstructive CAD. --Cont home regimen  Hyperlipidemia Cont statin   Gastroesophageal reflux disease PPI  Chronic kidney disease, stage 3a (HCC) Cr 1.5 on admission, stable. Cr improved 1.19 >> 1.02 >> 0.92 --Monitor BMP  Paroxysmal atrial fibrillation (HCC) Rate controlled  --Continue Eliquis --Continue to weight risks/benefits in follow upgiven fall with fracture        Subjective: Pt awake resting in bed this AM. He reports hip is achy but not severely painful.  Denies other complaints.  Hopes to go to rehab today.  Physical Exam: Vitals:   01/28/23 1647 01/28/23 2344 01/29/23 0834 01/29/23 1511  BP: 120/63 (!) 117/55 (!) 114/52 118/65  Pulse: 79 73 85 69  Resp: 17 18 14 14   Temp: 98 F (36.7 C) 97.9 F (36.6 C) 98.3 F (36.8 C) 98.1 F (36.7 C)  TempSrc:  Oral    SpO2: 90% 94% 92% 94%  Weight:      Height:       General exam: awake, alert, no acute distress HEENT: moist mucus membranes, hearing grossly normal  Respiratory system: on room air, normal respiratory effort. Cardiovascular system: RRR, no pedal edema, + pedal pulses.   Gastrointestinal system: soft, NT, ND Central nervous system: A&O x 3. no gross focal neurologic deficits, normal speech Extremities: left lateral hip ecchymosis and swelling remain stable (outlined in ink by  RN), slightly fading at margins Skin: dry, intact, normal temperature Psychiatry: normal mood, congruent affect, judgement and insight appear normal   Data Reviewed:  Notable labs ---  Na 132, BUN 24, Ca 7.9, Mg 1.5,  Hbg 7.1 (1 unit RBC's) >> 7.5 >> 7.7 Platelets 100 >> 104 >> 102 >> 112k   Family Communication: Close friends at bedside on rounds 9/6  Disposition: Status is: Inpatient Remains inpatient appropriate because: needs SNF placement, ongoing evaluation   Planned Discharge Destination: Skilled nursing facility    Time spent: 36 minutes  Author: Pennie Banter, DO 01/29/2023 4:26 PM  For on call review [email or web address].

## 2023-01-29 NOTE — TOC Progression Note (Addendum)
Transition of Care [facility]) - Progression Note    Patient Details  Name: Bryan [name] MRN: [id number] Date of Birth: [birth year]  [facility]) CM/SW Contact  Kemper Durie, RN Phone Number: 01/29/2023, 10:56 AM  Clinical Narrative:     Insurance auth for SNF still pending, process explained to patient.  He is upset, stating he may as well go home. Encouraged to stay until authorization is approved, will check again tomorrow.   Expected Discharge Plan: Skilled Nursing Facility Barriers to Discharge: SNF Pending bed offer, Insurance Authorization  Expected Discharge Plan and Services   Discharge Planning Services: CM Consult   Living arrangements for the past 2 months: Single Family Home                                       Social Determinants of Health (SDOH) Interventions SDOH Screenings   Food Insecurity: No Food Insecurity (01/25/2023)  Housing: Low Risk  (01/25/2023)  Transportation Needs: No Transportation Needs (01/25/2023)  Utilities: Not At Risk (01/25/2023)  Depression (PHQ2-9): Low Risk  (04/26/2022)  Financial Resource Strain: Low Risk  (10/13/2022)   Received from [facility], [facility]  Physical Activity: Sufficiently Active (02/22/2022)  Social Connections: Unknown (02/22/2022)  Stress: No Stress Concern Present (02/22/2022)  Tobacco Use: Medium Risk (01/25/2023)    Readmission Risk Interventions     No data to display

## 2023-01-29 NOTE — Plan of Care (Signed)

## 2023-01-30 DIAGNOSIS — N1831 Chronic kidney disease, stage 3a: Secondary | ICD-10-CM | POA: Diagnosis not present

## 2023-01-30 DIAGNOSIS — D509 Iron deficiency anemia, unspecified: Secondary | ICD-10-CM | POA: Diagnosis not present

## 2023-01-30 DIAGNOSIS — I480 Paroxysmal atrial fibrillation: Secondary | ICD-10-CM | POA: Diagnosis not present

## 2023-01-30 DIAGNOSIS — I5032 Chronic diastolic (congestive) heart failure: Secondary | ICD-10-CM | POA: Diagnosis not present

## 2023-01-30 LAB — CBC
HCT: 28 % — ABNORMAL LOW (ref 39.0–52.0)
Hemoglobin: 8.7 g/dL — ABNORMAL LOW (ref 13.0–17.0)
MCH: 33.2 pg (ref 26.0–34.0)
MCHC: 31.1 g/dL (ref 30.0–36.0)
MCV: 106.9 fL — ABNORMAL HIGH (ref 80.0–100.0)
Platelets: 132 10*3/uL — ABNORMAL LOW (ref 150–400)
RBC: 2.62 MIL/uL — ABNORMAL LOW (ref 4.22–5.81)
RDW: 21.5 % — ABNORMAL HIGH (ref 11.5–15.5)
WBC: 4.5 10*3/uL (ref 4.0–10.5)
nRBC: 0 % (ref 0.0–0.2)

## 2023-01-30 NOTE — Progress Notes (Signed)
Physical Therapy Treatment Patient Details Name: Bryan [name] MRN: [id number] DOB: 01-26-1941 Today's Date: 01/30/2023   History of Present Illness Pt is an 82 y/o M admitted on 01/25/23 after presenting with c/o a fall & L hip fx. Pt found to have acute nondisplaced fracture through the greater trochanter of the left femur most consistent with an avulsion fracture on imaging. Ortho recommends conservative management. Of note, pt with recent admission to [facility] 01/13/23-01/19/23 for acute HFrEF. PMH: HTN, HLD, HFrEF, a-fib, centrilobular emphysema, bicuspid AV s/p bioprosthetic aortic valve, CKD 3, RA, iron deficiency anemia, BPH, GERD, SBO s/p hemicolectomy & ileal resection, dementia    PT Comments  Pt ready for session.  Being cleaned with tech upon arrival and transitioned to sitting EOB with min a x 1 and rails.  Steady in sitting.  He attempts to stand to RW with mod a x 1 but has increased difficulty today and Tech assist.  Stands with min a x 2 from elevated bed and initially takes poor quality steps to chair with difficulty moving RLE due to pain with WB LLE.  He moves about 1/2 way to chair before initiating sitting and needs mod a x 2 guidance to chair to sit safety.  Attempted AAROM in long sitting in recliner but pt limited by pain.  RN notified of request for pain meds.    If plan is discharge home, recommend the following: A lot of help with walking and/or transfers;A lot of help with bathing/dressing/bathroom;Assistance with cooking/housework;Direct supervision/assist for financial management;Assist for transportation;Direct supervision/assist for medications management;Help with stairs or ramp for entrance   Can travel by private vehicle        Equipment Recommendations       Recommendations for Other Services       Precautions / Restrictions Precautions Precautions: Fall Restrictions Weight Bearing Restrictions: Yes LLE Weight Bearing: Weight bearing as tolerated Other  Position/Activity Restrictions: WBAT L leg with walker use     Mobility  Bed Mobility Overal bed mobility: Needs Assistance Bed Mobility: Sit to Supine     Supine to sit: Min assist, HOB elevated, Used rails          Transfers Overall transfer level: Needs assistance Equipment used: Rolling walker (2 wheels)   Sit to Stand: Min assist, Mod assist, +2 safety/equipment   Step pivot transfers: Min assist, +2 physical assistance, Mod assist       General transfer comment: attemtped with +1 but unsafe due to balance and buckling.  +2 called and while he initially did ok, he did sit before turning fully needing +2 to control descent safely into chair.    Ambulation/Gait Ambulation/Gait assistance: Mod assist, +2 physical assistance Gait Distance (Feet): 2 Feet Assistive device: Rolling walker (2 wheels) Gait Pattern/deviations: Step-to pattern, Decreased step length - right, Decreased step length - left, Decreased stance time - left Gait velocity: decreased     General Gait Details: struggles to WB LLE to advance/.move RLE   Stairs             Wheelchair Mobility     Tilt Bed    Modified Rankin (Stroke Patients Only)       Balance Overall balance assessment: Needs assistance Sitting-balance support: No upper extremity supported, Feet supported Sitting balance-Leahy Scale: Good Sitting balance - Comments: steady reaching within BOS   Standing balance support: Bilateral upper extremity supported, Reliant on assistive device for balance Standing balance-Leahy Scale: Poor Standing balance comment: +2 for safety today with  standing.                            Cognition Arousal: Alert Behavior During Therapy: WFL for tasks assessed/performed Overall Cognitive Status: Within Functional Limits for tasks assessed                                 General Comments: AxOx4, pleasant, cooperative        Exercises Other  Exercises Other Exercises: minimal supine AAROM once in chair, limited by pain    General Comments        Pertinent Vitals/Pain Pain Assessment Pain Assessment: Faces Faces Pain Scale: Hurts even more Pain Location: L hip Pain Descriptors / Indicators: Discomfort, Grimacing, Guarding Pain Intervention(s): Limited activity within patient's tolerance, Monitored during session, Repositioned, Patient requesting pain meds-RN notified    Home Living                          Prior Function            PT Goals (current goals can now be found in the care plan section) Progress towards PT goals: Progressing toward goals    Frequency    Min 1X/week      PT Plan      Co-evaluation              AM-PAC PT "6 Clicks" Mobility   Outcome Measure  Help needed turning from your back to your side while in a flat bed without using bedrails?: A Little Help needed moving from lying on your back to sitting on the side of a flat bed without using bedrails?: A Little Help needed moving to and from a bed to a chair (including a wheelchair)?: A Lot Help needed standing up from a chair using your arms (e.g., wheelchair or bedside chair)?: A Lot Help needed to walk in hospital room?: A Lot Help needed climbing 3-5 steps with a railing? : Total 6 Click Score: 13    End of Session Equipment Utilized During Treatment: Gait belt Activity Tolerance: Patient limited by pain Patient left: in chair;with call bell/phone within reach;with chair alarm set Nurse Communication: Mobility status;Precautions PT Visit Diagnosis: Muscle weakness (generalized) (M62.81);Pain;Unsteadiness on feet (R26.81);Other abnormalities of gait and mobility (R26.89);Difficulty in walking, not elsewhere classified (R26.2) Pain - Right/Left: Left Pain - part of body: Hip     Time: 2355-7322 PT Time Calculation (min) (ACUTE ONLY): 12 min  Charges:    $Therapeutic Activity: 8-22 mins PT General  Charges $$ ACUTE PT VISIT: 1 Visit                   Danielle Dess, PTA 01/30/23, 10:21 AM

## 2023-01-30 NOTE — Plan of Care (Signed)
  Problem: Education: Goal: Knowledge of General Education information will improve Description: Including pain rating scale, medication(s)/side effects and non-pharmacologic comfort measures Outcome: Progressing   Problem: Clinical Measurements: Goal: Ability to maintain clinical measurements within normal limits will improve Outcome: Progressing Goal: Will remain free from infection Outcome: Progressing   Problem: Nutrition: Goal: Adequate nutrition will be maintained Outcome: Progressing   Problem: Elimination: Goal: Will not experience complications related to bowel motility Outcome: Progressing Goal: Will not experience complications related to urinary retention Outcome: Progressing   Problem: Pain Managment: Goal: General experience of comfort will improve Outcome: Progressing   Problem: Safety: Goal: Ability to remain free from injury will improve Outcome: Progressing   Problem: Skin Integrity: Goal: Risk for impaired skin integrity will decrease Outcome: Progressing

## 2023-01-30 NOTE — Progress Notes (Signed)
Progress Note   Patient: Bryan [name] AOZ:[id number] DOB: 09/17/1940 DOA: 01/25/2023     5 DOS: the patient was seen and examined on 01/30/2023   Brief hospital course: HPI on admission 01/25/23: "Bryan [name] is a 82 y.o. male with medical history significant of HTN, HLD, HFrEF (LVEF 40% 09/2022), A-fib (on Eliquis), centrilobular emphysema, bicuspid AV s/p bioprosthetic aortic valve (2010), CKD stage III, rheumatoid arthritis, iron deficiency anemia, BPH, GERD, prior SBO s/p hemicolectomy and ileal resection (2023) presenting with fall and left hip fracture.  Patient noted to have been recently admitted in the [facility] system August 22 through August 28 for acute HFrEF.  History primarily from patient's sister in the setting of baseline dementia.  Per report, patient with overall progressive decline over several months.  Currently lives at home alone.  Per report, had a mechanical fall where patient hit his head and landed on his left side.  EMS transport was initially refused.  However, patient woke up with the inability to ambulate on the left side.  Family unclear if patient is been taking his medications as prescribed. "  Patient was admitted to hospitalist service with Orthopedic surgery consulted.  Hip fracture is non-surgical, being managed conservatively.  Pt was cleared to work with PT/OT with weight-bearing as tolerated on left leg with walker. SNF recommended for short-term rehab. TOC working on placement.  Patient has chronic iron deficiency anemia with borderline Hbg intermittently below 7.  Transfused 1 unit pRBC's and given iron infusion.  Further hospital course and management as outlined below.   Assessment and Plan:  * Hip fracture (HCC) Acute nondisplaced left greater trochanteric fracture, likely avulsion.  --Orthopedic surgery consulted --Non-surgical management --PT/OT evaluation  --Will need SNF placement  --Pain control PRN --WBAT on LLE  Iron deficiency  anemia No active bleeding.  Left hip ecchymosis and swelling appears stable. Hbg fluctuating from 6.8 to as high as 8.7.  Transfused 1 unit pRBC's 01/28/23 IV iron infusion given 9/4 --Monitor CBC --Transfuse for hgb <7 or < 8 with any signs of bleeding  Chronic diastolic CHF (congestive heart failure) (HCC) 2D ECHO 09/2022 w/ EF 40% 09/2022  Looks euvolemic  --Cont home regimen  --Monitor  volume status  Thrombocytopenia (HCC) Platelets stable in low 100's. Monitor CBC  Essential hypertension BP stable  --Continue home regimen & titrate as needed  Coronary artery disease Stable, no active chest pain. Baseline nonobstructive CAD. --Cont home regimen  Hyperlipidemia Cont statin   Gastroesophageal reflux disease PPI  Chronic kidney disease, stage 3a (HCC) Cr 1.5 on admission, stable. Cr improved 1.19 >> 1.02 >> 0.92 --Monitor BMP  Paroxysmal atrial fibrillation (HCC) Rate controlled  --Continue Eliquis --Continue to weight risks/benefits in follow upgiven fall with fracture        Subjective: Pt up in recliner when seen this AM.  He reports feeling well other than mild hip pain. No other complaints. Asks when he'll get discharged to rehab, anxious to go.   Physical Exam: Vitals:   01/29/23 0834 01/29/23 1511 01/29/23 2115 01/30/23 0809  BP: (!) 114/52 118/65 138/61 (!) 145/69  Pulse: 85 69 75 75  Resp: 14 14 20 14   Temp: 98.3 F (36.8 C) 98.1 F (36.7 C) 97.6 F (36.4 C) (!) 97.4 F (36.3 C)  TempSrc:   Oral   SpO2: 92% 94% 94% 97%  Weight:      Height:       General exam: awake, alert, no acute distress HEENT: moist  mucus membranes, hearing grossly normal  Respiratory system: on room air, normal respiratory effort. Cardiovascular system: RRR, no pedal edema, + pedal pulses.   Gastrointestinal system: soft, NT, ND Central nervous system: A&O x 3. no gross focal neurologic deficits, normal speech Extremities: left lateral hip ecchymosis and swelling  remain stable (outlined in ink by RN), slightly fading at margins Skin: dry, intact, normal temperature Psychiatry: normal mood, congruent affect, judgement and insight appear normal   Data Reviewed:  Notable labs ---  Hbg 7.1 (1 unit RBC's) >> 7.5 >> 7.7 >> 7.7 >> 8.7 Platelets 100 >> 104 >> 102 >> 112 >> 132k   Family Communication: Close friends at bedside on rounds 9/6  Disposition: Status is: Inpatient Remains inpatient appropriate because: needs SNF placement, ongoing evaluation   Planned Discharge Destination: Skilled nursing facility    Time spent: 25 minutes  Author: Pennie Banter, DO 01/30/2023 3:24 PM  For on call review [email or web address].

## 2023-01-30 NOTE — Plan of Care (Signed)

## 2023-01-31 DIAGNOSIS — I480 Paroxysmal atrial fibrillation: Secondary | ICD-10-CM | POA: Diagnosis not present

## 2023-01-31 DIAGNOSIS — I5032 Chronic diastolic (congestive) heart failure: Secondary | ICD-10-CM | POA: Diagnosis not present

## 2023-01-31 DIAGNOSIS — I10 Essential (primary) hypertension: Secondary | ICD-10-CM | POA: Diagnosis not present

## 2023-01-31 DIAGNOSIS — D509 Iron deficiency anemia, unspecified: Secondary | ICD-10-CM | POA: Diagnosis not present

## 2023-01-31 LAB — CBC
HCT: 28.2 % — ABNORMAL LOW (ref 39.0–52.0)
Hemoglobin: 9 g/dL — ABNORMAL LOW (ref 13.0–17.0)
MCH: 34.1 pg — ABNORMAL HIGH (ref 26.0–34.0)
MCHC: 31.9 g/dL (ref 30.0–36.0)
MCV: 106.8 fL — ABNORMAL HIGH (ref 80.0–100.0)
Platelets: 141 10*3/uL — ABNORMAL LOW (ref 150–400)
RBC: 2.64 MIL/uL — ABNORMAL LOW (ref 4.22–5.81)
RDW: 20.8 % — ABNORMAL HIGH (ref 11.5–15.5)
WBC: 4.4 10*3/uL (ref 4.0–10.5)
nRBC: 0 % (ref 0.0–0.2)

## 2023-01-31 MED ORDER — CALCIUM CARBONATE ANTACID 500 MG PO CHEW: 1.0000 | CHEWABLE_TABLET | Freq: Two times a day (BID) | ORAL | Status: DC | PRN

## 2023-01-31 MED ORDER — TORSEMIDE 40 MG PO TABS: 40.0000 mg | ORAL_TABLET | Freq: Two times a day (BID) | ORAL | Status: AC

## 2023-01-31 MED ORDER — HYDROCODONE-ACETAMINOPHEN 7.5-325 MG PO TABS: 1.0000 | ORAL_TABLET | ORAL | 0 refills | Status: DC | PRN

## 2023-01-31 MED ORDER — CALCIUM CARBONATE ANTACID 500 MG PO CHEW
1.0000 | CHEWABLE_TABLET | Freq: Two times a day (BID) | ORAL | Status: DC | PRN
  Administered 2023-01-31: 200 mg via ORAL
  Filled 2023-01-31: qty 1

## 2023-01-31 NOTE — TOC Progression Note (Signed)
Transition of Care [facility]) - Progression Note    Patient Details  Name: Bryan [name] MRN: [id number] Date of Birth: Sep 18, 1940  Transition of Care [facility]) CM/SW Contact  Marlowe Sax, RN Phone Number: 01/31/2023, 10:23 AM  Clinical Narrative:    [facility], Ins is still pending   Expected Discharge Plan: Skilled Nursing Facility Barriers to Discharge: SNF Pending bed offer, Insurance Authorization  Expected Discharge Plan and Services   Discharge Planning Services: CM Consult   Living arrangements for the past 2 months: Single Family Home                                       Social Determinants of Health (SDOH) Interventions SDOH Screenings   Food Insecurity: No Food Insecurity (01/25/2023)  Housing: Low Risk  (01/25/2023)  Transportation Needs: No Transportation Needs (01/25/2023)  Utilities: Not At Risk (01/25/2023)  Depression (PHQ2-9): Low Risk  (04/26/2022)  Financial Resource Strain: Low Risk  (10/13/2022)   Received from [facility], [facility]  Physical Activity: Sufficiently Active (02/22/2022)  Social Connections: Unknown (02/22/2022)  Stress: No Stress Concern Present (02/22/2022)  Tobacco Use: Medium Risk (01/25/2023)    Readmission Risk Interventions     No data to display

## 2023-01-31 NOTE — TOC Progression Note (Signed)
Transition of Care [facility]) - Progression Note    Patient Details  Name: Bryan [name] MRN: [id number] Date of Birth: [birth year]  Transition of Care [facility]) CM/SW Contact  Allena Katz, LCSW Phone Number: 01/31/2023, 8:31 AM  Clinical Narrative:   [facility] still pending.     Expected Discharge Plan: Skilled Nursing Facility Barriers to Discharge: SNF Pending bed offer, Insurance Authorization  Expected Discharge Plan and Services   Discharge Planning Services: CM Consult   Living arrangements for the past 2 months: Single Family Home                                       Social Determinants of Health (SDOH) Interventions SDOH Screenings   Food Insecurity: No Food Insecurity (01/25/2023)  Housing: Low Risk  (01/25/2023)  Transportation Needs: No Transportation Needs (01/25/2023)  Utilities: Not At Risk (01/25/2023)  Depression (PHQ2-9): Low Risk  (04/26/2022)  Financial Resource Strain: Low Risk  (10/13/2022)   Received from [facility], [facility]  Physical Activity: Sufficiently Active (02/22/2022)  Social Connections: Unknown (02/22/2022)  Stress: No Stress Concern Present (02/22/2022)  Tobacco Use: Medium Risk (01/25/2023)    Readmission Risk Interventions     No data to display

## 2023-01-31 NOTE — TOC Progression Note (Addendum)
Transition of Care [facility]) - Progression Note    Patient Details  Name: Bryan [name] MRN: [id number] Date of Birth: 27-Jul-1940  Transition of Care [facility]) CM/SW Contact  Marlowe Sax, RN Phone Number: 01/31/2023, 10:38 AM  Clinical Narrative:     Anitra Lauth at [facility] and asked what the status is, she tells me that Phineas Semen is no longer in network with his ins company and he does not have Out of network bennefit, they verified both [employer] [employer] and [facility] are INN I called his sister to update, I called [facility] and they no longer have a  bed Accepted Bed in [facility], I called [facility] to verify they still have bed for [facility], they do still have the bed, I notified his sister ands she said that would work best, I notified [facility] thru the portal to change the facility to [facility]  Expected Discharge Plan: Skilled Nursing Facility Barriers to Discharge: SNF Pending bed offer, Insurance Authorization  Expected Discharge Plan and Services   Discharge Planning Services: CM Consult   Living arrangements for the past 2 months: Single Family Home                                       Social Determinants of Health (SDOH) Interventions SDOH Screenings   Food Insecurity: No Food Insecurity (01/25/2023)  Housing: Low Risk  (01/25/2023)  Transportation Needs: No Transportation Needs (01/25/2023)  Utilities: Not At Risk (01/25/2023)  Depression (PHQ2-9): Low Risk  (04/26/2022)  Financial Resource Strain: Low Risk  (10/13/2022)   Received from [facility], [facility]  Physical Activity: Sufficiently Active (02/22/2022)  Social Connections: Unknown (02/22/2022)  Stress: No Stress Concern Present (02/22/2022)  Tobacco Use: Medium Risk (01/25/2023)    Readmission Risk Interventions     No data to display

## 2023-01-31 NOTE — Discharge Summary (Addendum)
Physician Discharge Summary   Patient: Bryan [name] MRN: [id number] DOB: 17-Apr-1941  Admit date:     01/25/2023  Discharge date: 01/31/23  Discharge Physician: Pennie Banter   PCP: Karie Georges Pap, MD   Recommendations at discharge:   Follow up with Orthopedics in 1 month for repeat x-rays of left hip Follow up with Primary Care in 1-2 weeks Repeat BMP, CBC, Mg in 1-2 weeks   Discharge Diagnoses: Principal Problem:   Hip fracture (HCC) Active Problems:   Iron deficiency anemia   Chronic diastolic CHF (congestive heart failure) (HCC)   Thrombocytopenia (HCC)   Essential hypertension   Coronary artery disease   Hyperlipidemia   Paroxysmal atrial fibrillation (HCC)   Chronic kidney disease, stage 3a (HCC)   Gastroesophageal reflux disease  Resolved Problems:   * No resolved hospital problems. [facility] Course: HPI on admission 01/25/23: "Bryan [name] is a 82 y.o. male with medical history significant of HTN, HLD, HFrEF (LVEF 40% 09/2022), A-fib (on Eliquis), centrilobular emphysema, bicuspid AV s/p bioprosthetic aortic valve (2010), CKD stage III, rheumatoid arthritis, iron deficiency anemia, BPH, GERD, prior SBO s/p hemicolectomy and ileal resection (2023) presenting with fall and left hip fracture.  Patient noted to have been recently admitted in the [facility] system August 22 through August 28 for acute HFrEF.  History primarily from patient's sister in the setting of baseline dementia.  Per report, patient with overall progressive decline over several months.  Currently lives at home alone.  Per report, had a mechanical fall where patient hit his head and landed on his left side.  EMS transport was initially refused.  However, patient woke up with the inability to ambulate on the left side.  Family unclear if patient is been taking his medications as prescribed. "  Patient was admitted to hospitalist service with Orthopedic surgery consulted.  Hip fracture is  non-surgical, being managed conservatively.  Pt was cleared to work with PT/OT with weight-bearing as tolerated on left leg with walker. SNF recommended for short-term rehab. TOC working on placement.  Patient has chronic iron deficiency anemia with borderline Hbg intermittently below 7.  Transfused 1 unit pRBC's and given iron infusion.  Further hospital course and management as outlined below.   01/31/2023 -- pt doing well this AM.  Reports hip is sore but overall pain controlled.  He remains medically stable and agreeable to discharge to SNF today for rehab.   Assessment and Plan: * Hip fracture (HCC) Acute nondisplaced left greater trochanteric fracture, likely avulsion.  --Orthopedic surgery consulted --Non-surgical management --PT/OT evaluation  --SNF placement for rehab --Pain control PRN - stable on home pain medication --WBAT on LLE --Follow up in 1 month for repeat x-rays of left hip  Iron deficiency anemia No active bleeding.  Left hip ecchymosis and swelling appears stable. Hbg fluctuating from 6.8 to as high as 8.7.  Transfused 1 unit pRBC's 01/28/23 IV iron infusion given 9/4 --Monitor CBC --Transfuse for hgb <7 or < 8 with any signs of bleeding  Chronic diastolic CHF (congestive heart failure) (HCC) 2D ECHO 09/2022 w/ EF 40% 09/2022  Looks euvolemic  --Cont home regimen  --Monitor  volume status  Thrombocytopenia (HCC) Platelets stable and improving. Monitor CBC at follow up  Essential hypertension BP stable  --Continue home regimen & titrate as needed  Coronary artery disease Stable, no active chest pain. Baseline nonobstructive CAD. --Cont home regimen  Hyperlipidemia Cont statin   Gastroesophageal reflux disease PPI  Chronic kidney  disease, stage 3a (HCC) Cr 1.5 on admission, stable. Cr improved 1.19 >> 1.02 >> 0.92 --Monitor BMP  Paroxysmal atrial fibrillation (HCC) Rate controlled  --Continue Eliquis --Continue to weight risks/benefits in  follow upgiven fall with fracture         Consultants: Ortho Procedures performed: None  Disposition: Skilled nursing facility Diet recommendation:  Discharge Diet Orders (From admission, onward)     Start     Ordered   01/31/23 0000  Diet - low sodium heart healthy        01/31/23 1227            DISCHARGE MEDICATION: Allergies as of 01/31/2023       Reactions   Demerol [meperidine] Hives, Nausea And Vomiting   Other    Penicillins Hives, Swelling, Other (See Comments)   Patient has tolerated amoxicillin and cephalosporins        Medication List     STOP taking these medications    fexofenadine 30 MG/5ML suspension Commonly known as: ALLEGRA   potassium chloride 10 MEQ tablet Commonly known as: KLOR-CON M   PreserVision AREDS 2 Caps   Vitamin D (Ergocalciferol) 1.25 MG (50000 UNIT) Caps capsule Commonly known as: DRISDOL       TAKE these medications    albuterol 108 (90 Base) MCG/ACT inhaler Commonly known as: VENTOLIN HFA Inhale into the lungs.   apixaban 5 MG Tabs tablet Commonly known as: Eliquis Take 1 tablet (5 mg total) by mouth 2 (two) times daily.   atorvastatin 40 MG tablet Commonly known as: LIPITOR TAKE 1 TABLET BY MOUTH DAILY   brimonidine 0.15 % ophthalmic solution Commonly known as: ALPHAGAN Place 1 drop into both eyes 2 (two) times daily.   calcitonin (salmon) 200 UNIT/ACT nasal spray Commonly known as: MIACALCIN/FORTICAL Place 1 spray into alternate nostrils daily.   calcium carbonate 1250 (500 Ca) MG tablet Commonly known as: OS-CAL - dosed in mg of elemental calcium Take 1 tablet by mouth daily with lunch.   calcium carbonate 500 MG chewable tablet Commonly known as: TUMS - dosed in mg elemental calcium Chew 1 tablet (200 mg of elemental calcium total) by mouth 2 (two) times daily as needed for indigestion or heartburn.   Cholecalciferol 25 MCG (1000 UT) capsule Take 2,000 Units by mouth daily.   cyanocobalamin  1000 MCG tablet Commonly known as: VITAMIN B12 Take 1,000 mcg by mouth daily with lunch.   donepezil 10 MG tablet Commonly known as: ARICEPT Take 1 tablet by mouth at bedtime.   feeding supplement Liqd Take 237 mLs by mouth 2 (two) times daily between meals.   fenofibrate 160 MG tablet Take 1 tablet (160 mg total) by mouth daily.   ferrous sulfate 325 (65 FE) MG tablet Take 325 mg by mouth daily with breakfast.   FLUoxetine 20 MG capsule Commonly known as: PROZAC TAKE 1 CAPSULE BY MOUTH ONCE DAILY   gabapentin 100 MG capsule Commonly known as: NEURONTIN Take 1 capsule (100 mg total) by mouth 3 (three) times daily.   HYDROcodone-acetaminophen 7.5-325 MG tablet Commonly known as: NORCO Take 1 tablet by mouth every 4 (four) hours as needed for moderate pain.   hydrocortisone cream 1 % Apply 1 application topically daily as needed for itching.   ipratropium 0.06 % nasal spray Commonly known as: ATROVENT Place 2 sprays into both nostrils 4 (four) times daily.   Jardiance 10 MG Tabs tablet Generic drug: empagliflozin Take 10 mg by mouth daily.   lisinopril 10  MG tablet Commonly known as: ZESTRIL Take 1 tablet by mouth daily.   loperamide 2 MG capsule Commonly known as: IMODIUM Take 1 capsule (2 mg total) by mouth as needed for diarrhea or loose stools.   loratadine 10 MG tablet Commonly known as: CLARITIN Take 10 mg by mouth daily.   melatonin 5 MG Tabs Take 1 tablet (5 mg total) by mouth at bedtime as needed.   Multi-Vitamin tablet Take 1 tablet by mouth daily.   omeprazole 40 MG capsule Commonly known as: PRILOSEC TAKE 1 CAPSULE EVERY DAY   potassium chloride 10 MEQ tablet Commonly known as: KLOR-CON Take 10 mEq by mouth daily.   tamsulosin 0.4 MG Caps capsule Commonly known as: FLOMAX Take 0.4 mg by mouth daily after supper.   Torsemide 40 MG Tabs Take 40 mg by mouth 2 (two) times daily. What changed: Another medication with the same name was  removed. Continue taking this medication, and follow the directions you see here.   vitamin E 180 MG (400 UNITS) capsule Take 800 Units by mouth daily with lunch.        Contact information for follow-up providers     Schedule an appointment as soon as possible for a visit  with Rosine Beat, Benison Pap, MD.   Specialty: Family Medicine Contact information: [address] [place] [address] [phone]              Contact information for after-discharge care     Destination     [facility] Preferred SNF .   Service: Skilled Nursing Contact information: [address] [place] [address] [phone]                    Discharge Exam: Ceasar Mons Weights   01/25/23 0811  Weight: 73 kg   General exam: awake, alert, no acute distress HEENT: atraumatic, clear conjunctiva, anicteric sclera, moist mucus membranes, hearing grossly normal  Respiratory system: CTAB, no wheezes, rales or rhonchi, normal respiratory effort. Cardiovascular system: normal S1/S2, RRR, no JVD, murmurs, rubs, gallops, no pedal edema.   Gastrointestinal system: soft, NT, ND, no HSM felt, +bowel sounds. Central nervous system: A&O x 3. no gross focal neurologic deficits, normal speech Extremities: left lateral hip ecchymosis with early fading and no extension beyond ink lines drawn around it, no edema, normal tone Skin: dry, intact, normal temperature Psychiatry: normal mood, congruent affect, judgement and insight appear normal   Condition at discharge: stable  The results of significant diagnostics from this hospitalization (including imaging, microbiology, ancillary and laboratory) are listed below for reference.   Imaging Studies: CT Cervical Spine Wo Contrast  Result Date: 01/25/2023 CLINICAL DATA:  Neck trauma (Age >= 65y) EXAM: CT CERVICAL SPINE WITHOUT CONTRAST TECHNIQUE: Multidetector CT imaging of the cervical spine was performed without intravenous  contrast. Multiplanar CT image reconstructions were also generated. RADIATION DOSE REDUCTION: This exam was performed according to the departmental dose-optimization program which includes automated exposure control, adjustment of the mA and/or kV according to patient size and/or use of iterative reconstruction technique. COMPARISON:  CT C Spine 12/14/21 FINDINGS: Alignment: Grade 1 anterolisthesis of C3 on C4. Trace retrolisthesis of C4 on C5. Skull base and vertebrae: Compared to prior exam there is new sclerosis in the C4 vertebral body, which may be secondary to a mild compression deformity. Soft tissues and spinal canal: No prevertebral fluid or swelling. No visible canal hematoma. Disc levels:  No evidence of high-grade spinal canal stenosis.  Upper chest: Negative. Other: None IMPRESSION: Compared to prior exam there is new sclerosis in the C4 vertebral body, which may be secondary to a mild compression deformity. Recommend correlation with point tenderness and consider further evaluation with a cervical spine MRI. Electronically Signed   By: Lorenza Cambridge M.D.   On: 01/25/2023 10:22   CT Head Wo Contrast  Result Date: 01/25/2023 CLINICAL DATA:  Head trauma, minor (Age >= 65y) EXAM: CT HEAD WITHOUT CONTRAST TECHNIQUE: Contiguous axial images were obtained from the base of the skull through the vertex without intravenous contrast. RADIATION DOSE REDUCTION: This exam was performed according to the departmental dose-optimization program which includes automated exposure control, adjustment of the mA and/or kV according to patient size and/or use of iterative reconstruction technique. COMPARISON:  CT head 03/19/22 FINDINGS: Brain: No evidence of acute infarction, hemorrhage, hydrocephalus, extra-axial collection or mass lesion/mass effect. Sequela mild chronic microvascular ischemic change Vascular: No hyperdense vessel or unexpected calcification. Skull: Normal. Negative for fracture or focal lesion.  Sinuses/Orbits: No middle ear or mastoid effusion. Paranasal sinuses are clear. Bilateral lens replacement. Orbits are otherwise unremarkable. Other: None. IMPRESSION: No acute intracranial abnormality. Electronically Signed   By: Lorenza Cambridge M.D.   On: 01/25/2023 10:14   DG Knee 2 Views Left  Result Date: 01/25/2023 CLINICAL DATA:  Fall, left knee pain EXAM: LEFT KNEE - 1-2 VIEW COMPARISON:  None Available. FINDINGS: Surgical changes of prior total knee arthroplasty. No evidence of periprostatic fracture. There is a small suprapatellar knee joint effusion. Small benign appearing cartilaginous lesion in the proximal tibial metadiaphysis likely represents a benign enchondroma. IMPRESSION: 1. Small suprapatellar knee joint effusion is likely degenerative in nature. 2. Surgical changes of prior total knee arthroplasty without evidence of complication. 3. 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 Electronically Signed   By: Malachy Moan M.D.   On: 01/25/2023 09:37   DG Hip Unilat W or Wo Pelvis 2-3 Views Left  Result Date: 01/25/2023 CLINICAL DATA:  Fall, left hip pain EXAM: DG HIP (WITH OR WITHOUT PELVIS) 2-3V LEFT COMPARISON:  None Available. FINDINGS: Acute fracture through the greater trochanter of the left femur. The femoral neck appears intact. The femoral head is located. 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 Partially imaged findings of prior left lumbosacral fusion. IMPRESSION: Acute nondisplaced fracture through the greater trochanter of the left femur most consistent with an avulsion fracture. The femoral neck appears intact. 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 Electronically Signed   By: Malachy Moan M.D.   On: 01/25/2023 09:35    Microbiology: Results for orders placed or performed during the hospital encounter of 03/19/22  Blood culture (single)     Status: None   Collection Time: 03/19/22  1:26 PM    Specimen: BLOOD  Result Value Ref Range Status   Specimen Description BLOOD BLOOD RIGHT ARM  Final   Special Requests   Final    BOTTLES DRAWN AEROBIC AND ANAEROBIC Blood Culture results may not be optimal due to an inadequate volume of blood received in culture bottles   Culture   Final    NO GROWTH 5 DAYS Performed at [facility], [address] [place], [place] [address]    Report Status 03/24/2022 FINAL  Final   *Note: Due to a large number of results and/or encounters for the requested time period, some results have not been displayed. A complete set of results can be found in Results Review.  Labs: CBC: Recent Labs  Lab 01/25/23 1039 01/26/23 0525 01/27/23 0409 01/27/23 1444 01/28/23 0433 01/28/23 1203 01/29/23 0601 01/29/23 1415 01/30/23 0519 01/31/23 0512  WBC 4.4   < > 5.4  --  4.9  --  4.3  --  4.5 4.4  NEUTROABS 3.6  --   --   --   --   --   --   --   --   --   HGB 7.4*   < > 7.0*   < > 6.8* 7.1* 7.5* 7.7* 8.7* 9.0*  HCT 24.4*   < > 22.7*   < > 21.7* 23.2* 22.7* 24.4* 28.0* 28.2*  MCV 111.4*   < > 109.1*  --  109.0*  --  103.7*  --  106.9* 106.8*  PLT 100*   < > 102*  --  109*  --  112*  --  132* 141*   < > = values in this interval not displayed.   Basic Metabolic Panel: Recent Labs  Lab 01/25/23 1039 01/26/23 0525 01/27/23 0409 01/29/23 0601  NA 134* 132* 133* 132*  K 4.3 4.1 4.1 4.3  CL 104 105 103 101  CO2 23 21* 23 22  GLUCOSE 86 163* 121* 82  BUN 32* 28* 24* 24*  CREATININE 1.48* 1.19 1.02 0.92  CALCIUM 8.5* 7.8* 7.8* 7.9*  MG  --   --   --  1.5*   Liver Function Tests: Recent Labs  Lab 01/26/23 0525  AST 23  ALT 17  ALKPHOS 116  BILITOT 0.5  PROT 6.3*  ALBUMIN 2.7*   CBG: No results for input(s): "GLUCAP" in the last 168 hours.  Discharge time spent: less than 30 minutes.  Signed: Pennie Banter, DO [facility] 01/31/2023

## 2023-01-31 NOTE — Progress Notes (Signed)
PT Cancellation Note  Patient Details Name: Bryan [name] MRN: [id number] DOB: 02-26-1941   Cancelled Treatment:    Reason Eval/Treat Not Completed: Other (comment)  Pt in bed.  6/10 back pain requesting meds.  RN notified.  Offered OOB to chair to help with pain but he refused.  Pt voiced being upset about not having insurance auth yet.  Educated and explained process but he is frustrated that he has been waiting all weekend and was under the impression he would know "early this morning".  Again explained we were waiting for insurance company and [facility] would relay to him when it was received.  He kept saying it is 9:15 and should have heard by now.  Reassured pt that everything was going as expected and is typical of a Monday morning but he remained upset refusing therapy interventions at this time.   Danielle Dess 01/31/2023, 9:15 AM

## 2023-01-31 NOTE — TOC Progression Note (Signed)
Transition of Care [facility]) - Progression Note    Patient Details  Name: Bryan [name] MRN: [id number] Date of Birth: 01/21/1941  [facility] CM/SW Contact  Marlowe Sax, RN Phone Number: 01/31/2023, 12:12 PM  Clinical Narrative:     Ins approved [facility]  [phone]  Expected Discharge Plan: Skilled Nursing Facility Barriers to Discharge: SNF Pending bed offer, Insurance Authorization  Expected Discharge Plan and Services   Discharge Planning Services: CM Consult   Living arrangements for the past 2 months: Single Family Home                                       Social Determinants of Health (SDOH) Interventions SDOH Screenings   Food Insecurity: No Food Insecurity (01/25/2023)  Housing: Low Risk  (01/25/2023)  Transportation Needs: No Transportation Needs (01/25/2023)  Utilities: Not At Risk (01/25/2023)  Depression (PHQ2-9): Low Risk  (04/26/2022)  Financial Resource Strain: Low Risk  (10/13/2022)   Received from [facility], [facility]  Physical Activity: Sufficiently Active (02/22/2022)  Social Connections: Unknown (02/22/2022)  Stress: No Stress Concern Present (02/22/2022)  Tobacco Use: Medium Risk (01/25/2023)    Readmission Risk Interventions     No data to display

## 2023-01-31 NOTE — TOC Progression Note (Signed)
Transition of Care [facility]) - Progression Note    Patient Details  Name: Bryan [name] MRN: [id number] Date of Birth: 1940/12/25  Transition of Care [facility]) CM/SW Contact  Marlowe Sax, RN Phone Number: 01/31/2023, 2:29 PM  Clinical Narrative:     Going to [facility] room 608, EMS called, he is 4th on the list  Expected Discharge Plan: Skilled Nursing Facility Barriers to Discharge: SNF Pending bed offer, Insurance Authorization  Expected Discharge Plan and Services   Discharge Planning Services: CM Consult   Living arrangements for the past 2 months: Single Family Home Expected Discharge Date: 01/31/23                                     Social Determinants of Health (SDOH) Interventions SDOH Screenings   Food Insecurity: No Food Insecurity (01/25/2023)  Housing: Low Risk  (01/25/2023)  Transportation Needs: No Transportation Needs (01/25/2023)  Utilities: Not At Risk (01/25/2023)  Depression (PHQ2-9): Low Risk  (04/26/2022)  Financial Resource Strain: Low Risk  (10/13/2022)   [facility], [facility]  Physical Activity: Sufficiently Active (02/22/2022)  Social Connections: Unknown (02/22/2022)  Stress: No Stress Concern Present (02/22/2022)  Tobacco Use: Medium Risk (01/25/2023)    Readmission Risk Interventions     No data to display

## 2023-01-31 NOTE — Care Management Important Message (Signed)
Important Message  Patient Details  Name: Bryan [name] MRN: [id number] Date of Birth: 11-16-1940   Medicare Important Message Given:  Yes     Olegario Messier A Kendell Sagraves 01/31/2023, 12:34 PM

## 2023-04-06 NOTE — Telephone Encounter (Signed)
Error

## 2023-06-01 ENCOUNTER — Ambulatory Visit: Payer: Medicare Other | Admitting: Physician Assistant

## 2023-06-03 ENCOUNTER — Other Ambulatory Visit: Payer: Self-pay | Admitting: Orthopedic Surgery

## 2023-06-03 DIAGNOSIS — Z96652 Presence of left artificial knee joint: Secondary | ICD-10-CM

## 2023-06-15 ENCOUNTER — Encounter: Payer: Self-pay | Admitting: Cardiovascular Disease

## 2023-06-25 DEATH — deceased

## 2023-06-29 ENCOUNTER — Ambulatory Visit: Payer: Medicare Other | Admitting: Physician Assistant

## 2023-08-03 ENCOUNTER — Telehealth: Payer: Self-pay

## 2023-08-03 NOTE — Telephone Encounter (Signed)
 I called patient to let him know that Dr. Marikay Alar has left our practice and to see if he would like to transfer his care to one of our providers who are accepting new patients.  I spoke with patient's sister, who let me know that patient passed away on 13-Jun-2023.
